# Patient Record
Sex: Female | Born: 1975 | Race: Black or African American | Hispanic: No | Marital: Single | Smoking: Never smoker
Health system: Southern US, Community
[De-identification: ages and names within clinical notes are randomized; demographics above are authoritative.]

## PROBLEM LIST (undated history)

## (undated) DIAGNOSIS — R0600 Dyspnea, unspecified: Secondary | ICD-10-CM

## (undated) DIAGNOSIS — N189 Chronic kidney disease, unspecified: Secondary | ICD-10-CM

## (undated) DIAGNOSIS — I881 Chronic lymphadenitis, except mesenteric: Secondary | ICD-10-CM

## (undated) DIAGNOSIS — R0902 Hypoxemia: Secondary | ICD-10-CM

## (undated) DIAGNOSIS — C50919 Malignant neoplasm of unspecified site of unspecified female breast: Principal | ICD-10-CM

## (undated) DIAGNOSIS — K219 Gastro-esophageal reflux disease without esophagitis: Secondary | ICD-10-CM

## (undated) DIAGNOSIS — T7840XA Allergy, unspecified, initial encounter: Secondary | ICD-10-CM

## (undated) DIAGNOSIS — R7302 Impaired glucose tolerance (oral): Secondary | ICD-10-CM

## (undated) DIAGNOSIS — Z9221 Personal history of antineoplastic chemotherapy: Secondary | ICD-10-CM

## (undated) DIAGNOSIS — I509 Heart failure, unspecified: Secondary | ICD-10-CM

## (undated) DIAGNOSIS — G620 Drug-induced polyneuropathy: Secondary | ICD-10-CM

## (undated) DIAGNOSIS — Z923 Personal history of irradiation: Secondary | ICD-10-CM

## (undated) DIAGNOSIS — Z5189 Encounter for other specified aftercare: Secondary | ICD-10-CM

## (undated) DIAGNOSIS — R0609 Other forms of dyspnea: Secondary | ICD-10-CM

## (undated) DIAGNOSIS — K760 Fatty (change of) liver, not elsewhere classified: Secondary | ICD-10-CM

## (undated) DIAGNOSIS — F419 Anxiety disorder, unspecified: Secondary | ICD-10-CM

## (undated) DIAGNOSIS — I10 Essential (primary) hypertension: Secondary | ICD-10-CM

## (undated) DIAGNOSIS — K3184 Gastroparesis: Secondary | ICD-10-CM

## (undated) DIAGNOSIS — G4730 Sleep apnea, unspecified: Secondary | ICD-10-CM

## (undated) DIAGNOSIS — M1990 Unspecified osteoarthritis, unspecified site: Secondary | ICD-10-CM

## (undated) DIAGNOSIS — G4733 Obstructive sleep apnea (adult) (pediatric): Secondary | ICD-10-CM

## (undated) DIAGNOSIS — IMO0002 Reserved for concepts with insufficient information to code with codable children: Secondary | ICD-10-CM

## (undated) HISTORY — DX: Unspecified osteoarthritis, unspecified site: M19.90

## (undated) HISTORY — DX: Heart failure, unspecified: I50.9

## (undated) HISTORY — DX: Allergy, unspecified, initial encounter: T78.40XA

## (undated) HISTORY — DX: Gastro-esophageal reflux disease without esophagitis: K21.9

## (undated) HISTORY — DX: Obstructive sleep apnea (adult) (pediatric): G47.33

## (undated) HISTORY — DX: Fatty (change of) liver, not elsewhere classified: K76.0

## (undated) HISTORY — DX: Sleep apnea, unspecified: G47.30

## (undated) HISTORY — DX: Drug-induced polyneuropathy: G62.0

## (undated) HISTORY — DX: Dyspnea, unspecified: R06.00

## (undated) HISTORY — DX: Encounter for other specified aftercare: Z51.89

## (undated) HISTORY — DX: Other forms of dyspnea: R06.09

## (undated) HISTORY — DX: Impaired glucose tolerance (oral): R73.02

## (undated) HISTORY — DX: Reserved for concepts with insufficient information to code with codable children: IMO0002

## (undated) HISTORY — DX: Malignant neoplasm of unspecified site of unspecified female breast: C50.919

## (undated) HISTORY — DX: Essential (primary) hypertension: I10

## (undated) HISTORY — DX: Anxiety disorder, unspecified: F41.9

## (undated) HISTORY — DX: Chronic kidney disease, unspecified: N18.9

## (undated) HISTORY — DX: Hypoxemia: R09.02

## (undated) HISTORY — DX: Gastroparesis: K31.84

## (undated) HISTORY — PX: BRAIN SURGERY: SHX531

---

## 2006-10-03 ENCOUNTER — Inpatient Hospital Stay (HOSPITAL_COMMUNITY): Admission: AD | Admit: 2006-10-03 | Discharge: 2006-10-03 | Payer: Self-pay | Admitting: Obstetrics & Gynecology

## 2006-10-06 ENCOUNTER — Inpatient Hospital Stay (HOSPITAL_COMMUNITY): Admission: AD | Admit: 2006-10-06 | Discharge: 2006-10-06 | Payer: Self-pay | Admitting: Obstetrics & Gynecology

## 2006-10-22 ENCOUNTER — Inpatient Hospital Stay (HOSPITAL_COMMUNITY): Admission: AD | Admit: 2006-10-22 | Discharge: 2006-10-22 | Payer: Self-pay | Admitting: Gynecology

## 2006-11-10 ENCOUNTER — Inpatient Hospital Stay (HOSPITAL_COMMUNITY): Admission: AD | Admit: 2006-11-10 | Discharge: 2006-11-10 | Payer: Self-pay | Admitting: Family Medicine

## 2006-12-16 ENCOUNTER — Emergency Department (HOSPITAL_COMMUNITY): Admission: EM | Admit: 2006-12-16 | Discharge: 2006-12-17 | Payer: Self-pay | Admitting: Emergency Medicine

## 2007-01-14 ENCOUNTER — Inpatient Hospital Stay (HOSPITAL_COMMUNITY): Admission: AD | Admit: 2007-01-14 | Discharge: 2007-01-14 | Payer: Self-pay | Admitting: Obstetrics

## 2007-03-04 ENCOUNTER — Inpatient Hospital Stay (HOSPITAL_COMMUNITY): Admission: AD | Admit: 2007-03-04 | Discharge: 2007-03-04 | Payer: Self-pay | Admitting: Obstetrics and Gynecology

## 2007-03-04 ENCOUNTER — Ambulatory Visit: Payer: Self-pay | Admitting: Obstetrics and Gynecology

## 2007-04-29 ENCOUNTER — Ambulatory Visit: Payer: Self-pay | Admitting: Obstetrics & Gynecology

## 2007-04-29 ENCOUNTER — Encounter: Payer: Self-pay | Admitting: Obstetrics & Gynecology

## 2007-04-30 ENCOUNTER — Ambulatory Visit (HOSPITAL_COMMUNITY): Admission: RE | Admit: 2007-04-30 | Discharge: 2007-04-30 | Payer: Self-pay | Admitting: Family Medicine

## 2007-05-13 ENCOUNTER — Ambulatory Visit: Payer: Self-pay | Admitting: Obstetrics & Gynecology

## 2007-05-20 ENCOUNTER — Ambulatory Visit: Payer: Self-pay | Admitting: Obstetrics & Gynecology

## 2007-05-20 ENCOUNTER — Inpatient Hospital Stay (HOSPITAL_COMMUNITY): Admission: AD | Admit: 2007-05-20 | Discharge: 2007-05-20 | Payer: Self-pay | Admitting: Obstetrics & Gynecology

## 2007-05-22 ENCOUNTER — Ambulatory Visit: Payer: Self-pay | Admitting: Obstetrics and Gynecology

## 2007-05-25 ENCOUNTER — Ambulatory Visit: Payer: Self-pay | Admitting: Obstetrics & Gynecology

## 2007-05-25 ENCOUNTER — Ambulatory Visit (HOSPITAL_COMMUNITY): Admission: RE | Admit: 2007-05-25 | Discharge: 2007-05-25 | Payer: Self-pay | Admitting: Obstetrics & Gynecology

## 2007-05-29 ENCOUNTER — Ambulatory Visit: Payer: Self-pay | Admitting: Obstetrics & Gynecology

## 2007-06-01 ENCOUNTER — Inpatient Hospital Stay (HOSPITAL_COMMUNITY): Admission: RE | Admit: 2007-06-01 | Discharge: 2007-06-03 | Payer: Self-pay | Admitting: Family Medicine

## 2007-06-01 ENCOUNTER — Ambulatory Visit: Payer: Self-pay | Admitting: Obstetrics and Gynecology

## 2007-08-12 ENCOUNTER — Emergency Department (HOSPITAL_COMMUNITY): Admission: EM | Admit: 2007-08-12 | Discharge: 2007-08-12 | Payer: Self-pay | Admitting: Emergency Medicine

## 2007-09-11 ENCOUNTER — Emergency Department (HOSPITAL_COMMUNITY): Admission: EM | Admit: 2007-09-11 | Discharge: 2007-09-11 | Payer: Self-pay | Admitting: Emergency Medicine

## 2007-09-27 ENCOUNTER — Inpatient Hospital Stay (HOSPITAL_COMMUNITY): Admission: AD | Admit: 2007-09-27 | Discharge: 2007-09-27 | Payer: Self-pay | Admitting: Family Medicine

## 2007-10-11 ENCOUNTER — Emergency Department (HOSPITAL_COMMUNITY): Admission: EM | Admit: 2007-10-11 | Discharge: 2007-10-11 | Payer: Self-pay | Admitting: Emergency Medicine

## 2007-12-04 ENCOUNTER — Emergency Department (HOSPITAL_COMMUNITY): Admission: EM | Admit: 2007-12-04 | Discharge: 2007-12-04 | Payer: Self-pay | Admitting: *Deleted

## 2007-12-10 ENCOUNTER — Emergency Department (HOSPITAL_COMMUNITY): Admission: EM | Admit: 2007-12-10 | Discharge: 2007-12-10 | Payer: Self-pay | Admitting: Emergency Medicine

## 2008-01-13 ENCOUNTER — Observation Stay (HOSPITAL_COMMUNITY): Admission: EM | Admit: 2008-01-13 | Discharge: 2008-01-14 | Payer: Self-pay | Admitting: Emergency Medicine

## 2008-04-01 ENCOUNTER — Inpatient Hospital Stay (HOSPITAL_COMMUNITY): Admission: AD | Admit: 2008-04-01 | Discharge: 2008-04-01 | Payer: Self-pay | Admitting: Obstetrics & Gynecology

## 2008-04-01 DIAGNOSIS — C50919 Malignant neoplasm of unspecified site of unspecified female breast: Secondary | ICD-10-CM

## 2008-04-01 DIAGNOSIS — Z9221 Personal history of antineoplastic chemotherapy: Secondary | ICD-10-CM

## 2008-04-01 DIAGNOSIS — Z923 Personal history of irradiation: Secondary | ICD-10-CM

## 2008-04-01 HISTORY — DX: Personal history of irradiation: Z92.3

## 2008-04-01 HISTORY — DX: Personal history of antineoplastic chemotherapy: Z92.21

## 2008-04-01 HISTORY — PX: LYMPH NODE DISSECTION: SHX5087

## 2008-04-01 HISTORY — DX: Malignant neoplasm of unspecified site of unspecified female breast: C50.919

## 2008-04-02 ENCOUNTER — Inpatient Hospital Stay (HOSPITAL_COMMUNITY): Admission: AD | Admit: 2008-04-02 | Discharge: 2008-04-03 | Payer: Self-pay | Admitting: Obstetrics & Gynecology

## 2008-04-02 ENCOUNTER — Ambulatory Visit: Payer: Self-pay | Admitting: Obstetrics & Gynecology

## 2008-04-02 ENCOUNTER — Encounter: Payer: Self-pay | Admitting: Obstetrics & Gynecology

## 2008-05-19 ENCOUNTER — Emergency Department (HOSPITAL_COMMUNITY): Admission: EM | Admit: 2008-05-19 | Discharge: 2008-05-19 | Payer: Self-pay | Admitting: Family Medicine

## 2008-05-20 ENCOUNTER — Encounter: Admission: RE | Admit: 2008-05-20 | Discharge: 2008-05-20 | Payer: Self-pay | Admitting: Family Medicine

## 2008-06-16 ENCOUNTER — Ambulatory Visit: Payer: Self-pay | Admitting: Oncology

## 2008-06-22 ENCOUNTER — Ambulatory Visit: Payer: Self-pay | Admitting: Genetic Counselor

## 2008-06-22 LAB — CBC WITH DIFFERENTIAL/PLATELET
Basophils Absolute: 0 10*3/uL (ref 0.0–0.1)
EOS%: 3.4 % (ref 0.0–7.0)
Eosinophils Absolute: 0.2 10*3/uL (ref 0.0–0.5)
HGB: 12.9 g/dL (ref 11.6–15.9)
LYMPH%: 40.4 % (ref 14.0–49.7)
MCH: 25.8 pg (ref 25.1–34.0)
MCV: 77.2 fL — ABNORMAL LOW (ref 79.5–101.0)
MONO%: 7.2 % (ref 0.0–14.0)
NEUT#: 2.3 10*3/uL (ref 1.5–6.5)
NEUT%: 48.6 % (ref 38.4–76.8)
Platelets: 217 10*3/uL (ref 145–400)
RDW: 17.3 % — ABNORMAL HIGH (ref 11.2–14.5)

## 2008-06-22 LAB — COMPREHENSIVE METABOLIC PANEL
Alkaline Phosphatase: 47 U/L (ref 39–117)
BUN: 7 mg/dL (ref 6–23)
CO2: 19 mEq/L (ref 19–32)
Creatinine, Ser: 0.94 mg/dL (ref 0.40–1.20)
Glucose, Bld: 89 mg/dL (ref 70–99)
Sodium: 139 mEq/L (ref 135–145)
Total Bilirubin: 0.3 mg/dL (ref 0.3–1.2)
Total Protein: 7.3 g/dL (ref 6.0–8.3)

## 2008-06-22 LAB — LACTATE DEHYDROGENASE: LDH: 270 U/L — ABNORMAL HIGH (ref 94–250)

## 2008-06-27 ENCOUNTER — Ambulatory Visit (HOSPITAL_COMMUNITY): Admission: RE | Admit: 2008-06-27 | Discharge: 2008-06-27 | Payer: Self-pay | Admitting: Oncology

## 2008-06-28 ENCOUNTER — Ambulatory Visit: Admission: RE | Admit: 2008-06-28 | Discharge: 2008-06-28 | Payer: Self-pay | Admitting: Oncology

## 2008-06-28 ENCOUNTER — Encounter: Payer: Self-pay | Admitting: Oncology

## 2008-06-29 ENCOUNTER — Ambulatory Visit (HOSPITAL_COMMUNITY): Admission: RE | Admit: 2008-06-29 | Discharge: 2008-06-29 | Payer: Self-pay | Admitting: Surgery

## 2008-07-11 ENCOUNTER — Emergency Department (HOSPITAL_COMMUNITY): Admission: EM | Admit: 2008-07-11 | Discharge: 2008-07-11 | Payer: Self-pay | Admitting: Emergency Medicine

## 2008-07-21 LAB — CBC WITH DIFFERENTIAL/PLATELET
Basophils Absolute: 0 10*3/uL (ref 0.0–0.1)
EOS%: 0.4 % (ref 0.0–7.0)
Eosinophils Absolute: 0 10*3/uL (ref 0.0–0.5)
HCT: 34.2 % — ABNORMAL LOW (ref 34.8–46.6)
HGB: 11.6 g/dL (ref 11.6–15.9)
MCH: 27.9 pg (ref 25.1–34.0)
MCV: 82.3 fL (ref 79.5–101.0)
NEUT#: 6.6 10*3/uL — ABNORMAL HIGH (ref 1.5–6.5)
NEUT%: 72.1 % (ref 38.4–76.8)
RDW: 21.3 % — ABNORMAL HIGH (ref 11.2–14.5)
lymph#: 1.7 10*3/uL (ref 0.9–3.3)

## 2008-08-03 ENCOUNTER — Ambulatory Visit: Payer: Self-pay | Admitting: Genetic Counselor

## 2008-08-05 ENCOUNTER — Ambulatory Visit: Payer: Self-pay | Admitting: Oncology

## 2008-08-05 LAB — CBC WITH DIFFERENTIAL/PLATELET
Basophils Absolute: 0.1 10*3/uL (ref 0.0–0.1)
Eosinophils Absolute: 0.1 10*3/uL (ref 0.0–0.5)
HGB: 13.2 g/dL (ref 11.6–15.9)
MCV: 81.4 fL (ref 79.5–101.0)
MONO%: 13.2 % (ref 0.0–14.0)
NEUT#: 2.9 10*3/uL (ref 1.5–6.5)
Platelets: 275 10*3/uL (ref 145–400)
RDW: 20.7 % — ABNORMAL HIGH (ref 11.2–14.5)

## 2008-08-05 LAB — COMPREHENSIVE METABOLIC PANEL
Albumin: 4.3 g/dL (ref 3.5–5.2)
Alkaline Phosphatase: 49 U/L (ref 39–117)
BUN: 7 mg/dL (ref 6–23)
Glucose, Bld: 69 mg/dL — ABNORMAL LOW (ref 70–99)
Total Bilirubin: 0.7 mg/dL (ref 0.3–1.2)

## 2008-08-11 ENCOUNTER — Emergency Department (HOSPITAL_COMMUNITY): Admission: EM | Admit: 2008-08-11 | Discharge: 2008-08-11 | Payer: Self-pay | Admitting: Emergency Medicine

## 2008-08-19 LAB — CBC WITH DIFFERENTIAL/PLATELET
BASO%: 0.5 % (ref 0.0–2.0)
Basophils Absolute: 0 10*3/uL (ref 0.0–0.1)
Eosinophils Absolute: 0 10*3/uL (ref 0.0–0.5)
HCT: 38.3 % (ref 34.8–46.6)
HGB: 13 g/dL (ref 11.6–15.9)
LYMPH%: 22.4 % (ref 14.0–49.7)
MCHC: 33.9 g/dL (ref 31.5–36.0)
MONO#: 0.4 10*3/uL (ref 0.1–0.9)
NEUT%: 70 % (ref 38.4–76.8)
Platelets: 176 10*3/uL (ref 145–400)
WBC: 5.7 10*3/uL (ref 3.9–10.3)

## 2008-08-26 LAB — COMPREHENSIVE METABOLIC PANEL
AST: 26 U/L (ref 0–37)
Alkaline Phosphatase: 46 U/L (ref 39–117)
BUN: 8 mg/dL (ref 6–23)
Calcium: 9.2 mg/dL (ref 8.4–10.5)
Creatinine, Ser: 0.77 mg/dL (ref 0.40–1.20)
Glucose, Bld: 80 mg/dL (ref 70–99)

## 2008-08-26 LAB — CBC WITH DIFFERENTIAL/PLATELET
BASO%: 1 % (ref 0.0–2.0)
Basophils Absolute: 0 10*3/uL (ref 0.0–0.1)
EOS%: 1.3 % (ref 0.0–7.0)
HCT: 38.5 % (ref 34.8–46.6)
LYMPH%: 22.9 % (ref 14.0–49.7)
MCH: 30.1 pg (ref 25.1–34.0)
MCHC: 34.3 g/dL (ref 31.5–36.0)
MCV: 87.7 fL (ref 79.5–101.0)
MONO%: 12.5 % (ref 0.0–14.0)
NEUT%: 62.3 % (ref 38.4–76.8)
Platelets: 257 10*3/uL (ref 145–400)

## 2008-09-02 LAB — CBC WITH DIFFERENTIAL/PLATELET
Basophils Absolute: 0 10*3/uL (ref 0.0–0.1)
EOS%: 0.9 % (ref 0.0–7.0)
HCT: 36.3 % (ref 34.8–46.6)
HGB: 12.4 g/dL (ref 11.6–15.9)
MCH: 31.5 pg (ref 25.1–34.0)
MCV: 92.1 fL (ref 79.5–101.0)
MONO%: 8.5 % (ref 0.0–14.0)
NEUT%: 68.5 % (ref 38.4–76.8)

## 2008-09-12 ENCOUNTER — Encounter: Admission: RE | Admit: 2008-09-12 | Discharge: 2008-09-12 | Payer: Self-pay | Admitting: Oncology

## 2008-09-14 ENCOUNTER — Ambulatory Visit: Payer: Self-pay | Admitting: Oncology

## 2008-09-16 LAB — CBC WITH DIFFERENTIAL/PLATELET
Basophils Absolute: 0.1 10*3/uL (ref 0.0–0.1)
EOS%: 2.1 % (ref 0.0–7.0)
HGB: 12.7 g/dL (ref 11.6–15.9)
LYMPH%: 31.2 % (ref 14.0–49.7)
MCH: 30.9 pg (ref 25.1–34.0)
MCV: 91.2 fL (ref 79.5–101.0)
MONO%: 14.7 % — ABNORMAL HIGH (ref 0.0–14.0)
Platelets: 253 10*3/uL (ref 145–400)
RBC: 4.11 10*6/uL (ref 3.70–5.45)
RDW: 16 % — ABNORMAL HIGH (ref 11.2–14.5)

## 2008-09-16 LAB — COMPREHENSIVE METABOLIC PANEL
Alkaline Phosphatase: 48 U/L (ref 39–117)
BUN: 8 mg/dL (ref 6–23)
Glucose, Bld: 89 mg/dL (ref 70–99)
Sodium: 140 mEq/L (ref 135–145)
Total Bilirubin: 0.4 mg/dL (ref 0.3–1.2)
Total Protein: 7.3 g/dL (ref 6.0–8.3)

## 2008-09-22 LAB — CBC WITH DIFFERENTIAL/PLATELET
Basophils Absolute: 0 10*3/uL (ref 0.0–0.1)
Eosinophils Absolute: 0.1 10*3/uL (ref 0.0–0.5)
HGB: 11.9 g/dL (ref 11.6–15.9)
LYMPH%: 18.5 % (ref 14.0–49.7)
MCV: 93.5 fL (ref 79.5–101.0)
MONO%: 6.7 % (ref 0.0–14.0)
NEUT#: 3.7 10*3/uL (ref 1.5–6.5)
Platelets: 178 10*3/uL (ref 145–400)

## 2008-09-29 HISTORY — PX: BREAST LUMPECTOMY: SHX2

## 2008-10-07 LAB — COMPREHENSIVE METABOLIC PANEL
ALT: 32 U/L (ref 0–35)
AST: 25 U/L (ref 0–37)
Albumin: 3.8 g/dL (ref 3.5–5.2)
Alkaline Phosphatase: 38 U/L — ABNORMAL LOW (ref 39–117)
BUN: 6 mg/dL (ref 6–23)
CO2: 25 mEq/L (ref 19–32)
Calcium: 9.4 mg/dL (ref 8.4–10.5)
Chloride: 107 mEq/L (ref 96–112)
Creatinine, Ser: 0.79 mg/dL (ref 0.40–1.20)
Glucose, Bld: 99 mg/dL (ref 70–99)
Potassium: 3.6 mEq/L (ref 3.5–5.3)
Sodium: 137 mEq/L (ref 135–145)
Total Bilirubin: 1 mg/dL (ref 0.3–1.2)
Total Protein: 7.1 g/dL (ref 6.0–8.3)

## 2008-10-07 LAB — CBC WITH DIFFERENTIAL/PLATELET
Basophils Absolute: 0 10*3/uL (ref 0.0–0.1)
EOS%: 1.7 % (ref 0.0–7.0)
HGB: 13.1 g/dL (ref 11.6–15.9)
MCH: 32.7 pg (ref 25.1–34.0)
MCV: 94 fL (ref 79.5–101.0)
MONO%: 9.6 % (ref 0.0–14.0)
NEUT%: 65.2 % (ref 38.4–76.8)
RDW: 15.3 % — ABNORMAL HIGH (ref 11.2–14.5)

## 2008-10-15 ENCOUNTER — Emergency Department (HOSPITAL_COMMUNITY): Admission: EM | Admit: 2008-10-15 | Discharge: 2008-10-16 | Payer: Self-pay | Admitting: Emergency Medicine

## 2008-10-25 ENCOUNTER — Encounter: Admission: RE | Admit: 2008-10-25 | Discharge: 2008-10-25 | Payer: Self-pay | Admitting: Surgery

## 2008-10-27 ENCOUNTER — Ambulatory Visit (HOSPITAL_BASED_OUTPATIENT_CLINIC_OR_DEPARTMENT_OTHER): Admission: RE | Admit: 2008-10-27 | Discharge: 2008-10-27 | Payer: Self-pay | Admitting: Surgery

## 2008-10-27 ENCOUNTER — Encounter (INDEPENDENT_AMBULATORY_CARE_PROVIDER_SITE_OTHER): Payer: Self-pay | Admitting: Surgery

## 2008-10-27 ENCOUNTER — Encounter: Admission: RE | Admit: 2008-10-27 | Discharge: 2008-10-27 | Payer: Self-pay | Admitting: Surgery

## 2008-11-10 ENCOUNTER — Encounter (INDEPENDENT_AMBULATORY_CARE_PROVIDER_SITE_OTHER): Payer: Self-pay | Admitting: Surgery

## 2008-11-10 ENCOUNTER — Ambulatory Visit (HOSPITAL_COMMUNITY): Admission: RE | Admit: 2008-11-10 | Discharge: 2008-11-10 | Payer: Self-pay | Admitting: Surgery

## 2008-11-11 ENCOUNTER — Ambulatory Visit: Payer: Self-pay | Admitting: Oncology

## 2008-11-15 LAB — CBC WITH DIFFERENTIAL/PLATELET
Eosinophils Absolute: 0.2 10*3/uL (ref 0.0–0.5)
MONO#: 0.2 10*3/uL (ref 0.1–0.9)
NEUT#: 2.4 10*3/uL (ref 1.5–6.5)
Platelets: 234 10*3/uL (ref 145–400)
RBC: 4.12 10*6/uL (ref 3.70–5.45)
RDW: 13.7 % (ref 11.2–14.5)
WBC: 4.3 10*3/uL (ref 3.9–10.3)

## 2008-11-15 LAB — COMPREHENSIVE METABOLIC PANEL
Albumin: 3.9 g/dL (ref 3.5–5.2)
CO2: 21 mEq/L (ref 19–32)
Chloride: 109 mEq/L (ref 96–112)
Glucose, Bld: 115 mg/dL — ABNORMAL HIGH (ref 70–99)
Potassium: 3.6 mEq/L (ref 3.5–5.3)
Sodium: 141 mEq/L (ref 135–145)
Total Protein: 7.1 g/dL (ref 6.0–8.3)

## 2008-11-16 ENCOUNTER — Emergency Department (HOSPITAL_COMMUNITY): Admission: EM | Admit: 2008-11-16 | Discharge: 2008-11-16 | Payer: Self-pay | Admitting: Emergency Medicine

## 2008-11-29 LAB — CBC WITH DIFFERENTIAL/PLATELET
Eosinophils Absolute: 0.1 10*3/uL (ref 0.0–0.5)
MCV: 92.3 fL (ref 79.5–101.0)
MONO#: 0.4 10*3/uL (ref 0.1–0.9)
MONO%: 9 % (ref 0.0–14.0)
NEUT#: 2.7 10*3/uL (ref 1.5–6.5)
RBC: 4.4 10*6/uL (ref 3.70–5.45)
RDW: 14.5 % (ref 11.2–14.5)
WBC: 4.8 10*3/uL (ref 3.9–10.3)

## 2008-11-29 LAB — COMPREHENSIVE METABOLIC PANEL
AST: 26 U/L (ref 0–37)
Albumin: 4.2 g/dL (ref 3.5–5.2)
Alkaline Phosphatase: 55 U/L (ref 39–117)
BUN: 9 mg/dL (ref 6–23)
Potassium: 3.5 mEq/L (ref 3.5–5.3)
Sodium: 138 mEq/L (ref 135–145)

## 2008-12-06 ENCOUNTER — Ambulatory Visit: Payer: Self-pay | Admitting: Oncology

## 2008-12-08 LAB — CBC WITH DIFFERENTIAL/PLATELET
BASO%: 1 % (ref 0.0–2.0)
HCT: 40.4 % (ref 34.8–46.6)
LYMPH%: 40.2 % (ref 14.0–49.7)
MCHC: 35.4 g/dL (ref 31.5–36.0)
MCV: 87.8 fL (ref 79.5–101.0)
MONO#: 0.4 10*3/uL (ref 0.1–0.9)
MONO%: 8.3 % (ref 0.0–14.0)
NEUT#: 2.3 10*3/uL (ref 1.5–6.5)
NEUT%: 47.2 % (ref 38.4–76.8)
Platelets: 223 10*3/uL (ref 145–400)

## 2008-12-14 ENCOUNTER — Emergency Department (HOSPITAL_COMMUNITY): Admission: EM | Admit: 2008-12-14 | Discharge: 2008-12-14 | Payer: Self-pay | Admitting: Emergency Medicine

## 2008-12-14 LAB — CBC WITH DIFFERENTIAL/PLATELET
BASO%: 0.6 % (ref 0.0–2.0)
Basophils Absolute: 0 10*3/uL (ref 0.0–0.1)
EOS%: 3 % (ref 0.0–7.0)
HCT: 41.1 % (ref 34.8–46.6)
HGB: 14.2 g/dL (ref 11.6–15.9)
LYMPH%: 43.7 % (ref 14.0–49.7)
MCH: 31.6 pg (ref 25.1–34.0)
MCHC: 34.5 g/dL (ref 31.5–36.0)
MCV: 91.4 fL (ref 79.5–101.0)
MONO%: 5 % (ref 0.0–14.0)
NEUT%: 47.7 % (ref 38.4–76.8)
Platelets: 223 10*3/uL (ref 145–400)

## 2008-12-21 ENCOUNTER — Emergency Department (HOSPITAL_COMMUNITY): Admission: EM | Admit: 2008-12-21 | Discharge: 2008-12-21 | Payer: Self-pay | Admitting: Emergency Medicine

## 2008-12-21 LAB — CBC WITH DIFFERENTIAL/PLATELET
BASO%: 0.6 % (ref 0.0–2.0)
Basophils Absolute: 0 10*3/uL (ref 0.0–0.1)
EOS%: 2.6 % (ref 0.0–7.0)
HGB: 13.4 g/dL (ref 11.6–15.9)
MCH: 31.9 pg (ref 25.1–34.0)
MCHC: 34.8 g/dL (ref 31.5–36.0)
MCV: 91.6 fL (ref 79.5–101.0)
MONO%: 4.6 % (ref 0.0–14.0)
RBC: 4.21 10*6/uL (ref 3.70–5.45)
RDW: 14.4 % (ref 11.2–14.5)

## 2008-12-22 ENCOUNTER — Emergency Department (HOSPITAL_COMMUNITY): Admission: EM | Admit: 2008-12-22 | Discharge: 2008-12-22 | Payer: Self-pay | Admitting: Emergency Medicine

## 2008-12-29 LAB — CBC WITH DIFFERENTIAL/PLATELET
EOS%: 1 % (ref 0.0–7.0)
Eosinophils Absolute: 0.1 10*3/uL (ref 0.0–0.5)
MCV: 88.1 fL (ref 79.5–101.0)
MONO%: 9.8 % (ref 0.0–14.0)
NEUT#: 3.1 10*3/uL (ref 1.5–6.5)
RBC: 4.77 10*6/uL (ref 3.70–5.45)
RDW: 13.7 % (ref 11.2–14.5)
lymph#: 1.4 10*3/uL (ref 0.9–3.3)

## 2009-01-05 ENCOUNTER — Ambulatory Visit: Payer: Self-pay | Admitting: Oncology

## 2009-01-05 LAB — CBC WITH DIFFERENTIAL/PLATELET
EOS%: 2.5 % (ref 0.0–7.0)
MCH: 30.9 pg (ref 25.1–34.0)
MCV: 88.6 fL (ref 79.5–101.0)
MONO%: 6.9 % (ref 0.0–14.0)
NEUT#: 2.1 10*3/uL (ref 1.5–6.5)
RBC: 4.21 10*6/uL (ref 3.70–5.45)
RDW: 13.8 % (ref 11.2–14.5)
nRBC: 0 % (ref 0–0)

## 2009-01-05 LAB — COMPREHENSIVE METABOLIC PANEL
AST: 19 U/L (ref 0–37)
Albumin: 2.8 g/dL — ABNORMAL LOW (ref 3.5–5.2)
BUN: 6 mg/dL (ref 6–23)
CO2: 19 mEq/L (ref 19–32)
Creatinine, Ser: 0.49 mg/dL (ref 0.40–1.20)
Total Bilirubin: 0.4 mg/dL (ref 0.3–1.2)
Total Protein: 5.2 g/dL — ABNORMAL LOW (ref 6.0–8.3)

## 2009-01-12 LAB — CBC WITH DIFFERENTIAL/PLATELET
BASO%: 0.9 % (ref 0.0–2.0)
HCT: 37.9 % (ref 34.8–46.6)
LYMPH%: 39.4 % (ref 14.0–49.7)
MCHC: 34.5 g/dL (ref 31.5–36.0)
MONO#: 0.3 10*3/uL (ref 0.1–0.9)
NEUT%: 50.6 % (ref 38.4–76.8)
Platelets: 249 10*3/uL (ref 145–400)
WBC: 4.4 10*3/uL (ref 3.9–10.3)

## 2009-01-12 LAB — COMPREHENSIVE METABOLIC PANEL
ALT: 32 U/L (ref 0–35)
AST: 26 U/L (ref 0–37)
Alkaline Phosphatase: 53 U/L (ref 39–117)
CO2: 20 mEq/L (ref 19–32)
Creatinine, Ser: 0.78 mg/dL (ref 0.40–1.20)
Total Bilirubin: 0.4 mg/dL (ref 0.3–1.2)

## 2009-01-18 LAB — CBC WITH DIFFERENTIAL/PLATELET
BASO%: 0.8 % (ref 0.0–2.0)
EOS%: 2.3 % (ref 0.0–7.0)
HCT: 36.1 % (ref 34.8–46.6)
LYMPH%: 47.2 % (ref 14.0–49.7)
MCH: 31.9 pg (ref 25.1–34.0)
MCHC: 35 g/dL (ref 31.5–36.0)
MONO%: 6.5 % (ref 0.0–14.0)
NEUT%: 43.2 % (ref 38.4–76.8)
Platelets: 280 10*3/uL (ref 145–400)
lymph#: 1.5 10*3/uL (ref 0.9–3.3)

## 2009-01-26 LAB — CBC WITH DIFFERENTIAL/PLATELET
BASO%: 1.7 % (ref 0.0–2.0)
EOS%: 3.1 % (ref 0.0–7.0)
MCH: 31.3 pg (ref 25.1–34.0)
MCHC: 34.7 g/dL (ref 31.5–36.0)
RBC: 3.96 10*6/uL (ref 3.70–5.45)
RDW: 15.8 % — ABNORMAL HIGH (ref 11.2–14.5)
lymph#: 2 10*3/uL (ref 0.9–3.3)

## 2009-02-02 LAB — COMPREHENSIVE METABOLIC PANEL
AST: 20 U/L (ref 0–37)
Albumin: 4.3 g/dL (ref 3.5–5.2)
BUN: 9 mg/dL (ref 6–23)
Calcium: 9 mg/dL (ref 8.4–10.5)
Chloride: 108 mEq/L (ref 96–112)
Glucose, Bld: 102 mg/dL — ABNORMAL HIGH (ref 70–99)
Potassium: 3.8 mEq/L (ref 3.5–5.3)

## 2009-02-02 LAB — CBC WITH DIFFERENTIAL/PLATELET
Basophils Absolute: 0 10*3/uL (ref 0.0–0.1)
EOS%: 3.1 % (ref 0.0–7.0)
Eosinophils Absolute: 0.1 10*3/uL (ref 0.0–0.5)
HGB: 12.7 g/dL (ref 11.6–15.9)
MONO#: 0.4 10*3/uL (ref 0.1–0.9)
NEUT#: 2.3 10*3/uL (ref 1.5–6.5)
RDW: 17.5 % — ABNORMAL HIGH (ref 11.2–14.5)
WBC: 4.4 10*3/uL (ref 3.9–10.3)
lymph#: 1.4 10*3/uL (ref 0.9–3.3)

## 2009-02-07 ENCOUNTER — Ambulatory Visit: Payer: Self-pay | Admitting: Oncology

## 2009-02-07 ENCOUNTER — Emergency Department (HOSPITAL_COMMUNITY): Admission: EM | Admit: 2009-02-07 | Discharge: 2009-02-08 | Payer: Self-pay | Admitting: Emergency Medicine

## 2009-02-09 LAB — CBC WITH DIFFERENTIAL/PLATELET
BASO%: 0.9 % (ref 0.0–2.0)
Basophils Absolute: 0 10*3/uL (ref 0.0–0.1)
EOS%: 3.1 % (ref 0.0–7.0)
HGB: 12.8 g/dL (ref 11.6–15.9)
MCH: 32.7 pg (ref 25.1–34.0)
RDW: 18 % — ABNORMAL HIGH (ref 11.2–14.5)
lymph#: 1.5 10*3/uL (ref 0.9–3.3)

## 2009-02-09 LAB — COMPREHENSIVE METABOLIC PANEL
ALT: 31 U/L (ref 0–35)
AST: 29 U/L (ref 0–37)
Albumin: 4.6 g/dL (ref 3.5–5.2)
Calcium: 9.4 mg/dL (ref 8.4–10.5)
Chloride: 104 mEq/L (ref 96–112)
Potassium: 3.7 mEq/L (ref 3.5–5.3)
Sodium: 139 mEq/L (ref 135–145)
Total Protein: 7.5 g/dL (ref 6.0–8.3)

## 2009-02-16 LAB — COMPREHENSIVE METABOLIC PANEL
ALT: 30 U/L (ref 0–35)
AST: 27 U/L (ref 0–37)
Albumin: 4.3 g/dL (ref 3.5–5.2)
Alkaline Phosphatase: 46 U/L (ref 39–117)
Calcium: 9.4 mg/dL (ref 8.4–10.5)
Chloride: 105 mEq/L (ref 96–112)
Creatinine, Ser: 0.8 mg/dL (ref 0.40–1.20)
Potassium: 3.8 mEq/L (ref 3.5–5.3)

## 2009-02-16 LAB — CBC WITH DIFFERENTIAL/PLATELET
BASO%: 0.6 % (ref 0.0–2.0)
EOS%: 2.7 % (ref 0.0–7.0)
MCH: 33.4 pg (ref 25.1–34.0)
MCHC: 34.3 g/dL (ref 31.5–36.0)
MONO%: 6.4 % (ref 0.0–14.0)
RDW: 18.7 % — ABNORMAL HIGH (ref 11.2–14.5)
lymph#: 1.5 10*3/uL (ref 0.9–3.3)

## 2009-03-02 LAB — CBC WITH DIFFERENTIAL/PLATELET
BASO%: 0.9 % (ref 0.0–2.0)
Basophils Absolute: 0 10*3/uL (ref 0.0–0.1)
EOS%: 2.4 % (ref 0.0–7.0)
HCT: 37.7 % (ref 34.8–46.6)
HGB: 12.9 g/dL (ref 11.6–15.9)
MCH: 33.3 pg (ref 25.1–34.0)
MCHC: 34.2 g/dL (ref 31.5–36.0)
MCV: 97.4 fL (ref 79.5–101.0)
MONO%: 11.7 % (ref 0.0–14.0)
NEUT%: 50 % (ref 38.4–76.8)
lymph#: 1.6 10*3/uL (ref 0.9–3.3)

## 2009-03-02 LAB — COMPREHENSIVE METABOLIC PANEL
ALT: 37 U/L — ABNORMAL HIGH (ref 0–35)
AST: 34 U/L (ref 0–37)
Alkaline Phosphatase: 57 U/L (ref 39–117)
BUN: 6 mg/dL (ref 6–23)
Creatinine, Ser: 0.84 mg/dL (ref 0.40–1.20)
Total Bilirubin: 0.7 mg/dL (ref 0.3–1.2)

## 2009-03-06 ENCOUNTER — Ambulatory Visit: Payer: Self-pay | Admitting: Oncology

## 2009-03-09 LAB — CBC WITH DIFFERENTIAL/PLATELET
BASO%: 2.2 % — ABNORMAL HIGH (ref 0.0–2.0)
Eosinophils Absolute: 0.2 10*3/uL (ref 0.0–0.5)
HCT: 39.3 % (ref 34.8–46.6)
HGB: 13.4 g/dL (ref 11.6–15.9)
MCHC: 34.1 g/dL (ref 31.5–36.0)
MONO#: 0.5 10*3/uL (ref 0.1–0.9)
NEUT#: 2.9 10*3/uL (ref 1.5–6.5)
NEUT%: 58.3 % (ref 38.4–76.8)
Platelets: 253 10*3/uL (ref 145–400)
WBC: 5.1 10*3/uL (ref 3.9–10.3)
lymph#: 1.3 10*3/uL (ref 0.9–3.3)

## 2009-03-09 LAB — COMPREHENSIVE METABOLIC PANEL
ALT: 39 U/L — ABNORMAL HIGH (ref 0–35)
BUN: 7 mg/dL (ref 6–23)
CO2: 25 mEq/L (ref 19–32)
Calcium: 9.2 mg/dL (ref 8.4–10.5)
Chloride: 106 mEq/L (ref 96–112)
Creatinine, Ser: 0.9 mg/dL (ref 0.40–1.20)
Glucose, Bld: 110 mg/dL — ABNORMAL HIGH (ref 70–99)
Total Bilirubin: 0.5 mg/dL (ref 0.3–1.2)

## 2009-03-21 ENCOUNTER — Ambulatory Visit: Admission: RE | Admit: 2009-03-21 | Discharge: 2009-03-31 | Payer: Self-pay | Admitting: Radiation Oncology

## 2009-03-27 LAB — CBC WITH DIFFERENTIAL/PLATELET
Basophils Absolute: 0 10*3/uL (ref 0.0–0.1)
HCT: 39.9 % (ref 34.8–46.6)
HGB: 13.7 g/dL (ref 11.6–15.9)
LYMPH%: 19.1 % (ref 14.0–49.7)
MCH: 32.8 pg (ref 25.1–34.0)
MONO#: 0.4 10*3/uL (ref 0.1–0.9)
NEUT%: 72.7 % (ref 38.4–76.8)
Platelets: 196 10*3/uL (ref 145–400)
lymph#: 1 10*3/uL (ref 0.9–3.3)

## 2009-03-27 LAB — COMPREHENSIVE METABOLIC PANEL
BUN: 8 mg/dL (ref 6–23)
CO2: 19 mEq/L (ref 19–32)
Calcium: 9.5 mg/dL (ref 8.4–10.5)
Chloride: 108 mEq/L (ref 96–112)
Creatinine, Ser: 0.81 mg/dL (ref 0.40–1.20)

## 2009-03-28 LAB — TSH: TSH: 0.817 u[IU]/mL (ref 0.350–4.500)

## 2009-03-30 ENCOUNTER — Encounter: Admission: RE | Admit: 2009-03-30 | Discharge: 2009-05-29 | Payer: Self-pay | Admitting: Radiation Oncology

## 2009-04-01 HISTORY — PX: CARPAL TUNNEL RELEASE: SHX101

## 2009-04-02 ENCOUNTER — Ambulatory Visit: Admission: RE | Admit: 2009-04-02 | Discharge: 2009-06-13 | Payer: Self-pay | Admitting: Radiation Oncology

## 2009-04-11 ENCOUNTER — Emergency Department (HOSPITAL_COMMUNITY): Admission: EM | Admit: 2009-04-11 | Discharge: 2009-04-12 | Payer: Self-pay | Admitting: Emergency Medicine

## 2009-05-22 ENCOUNTER — Encounter: Admission: RE | Admit: 2009-05-22 | Discharge: 2009-05-22 | Payer: Self-pay | Admitting: Oncology

## 2009-05-25 ENCOUNTER — Encounter: Admission: RE | Admit: 2009-05-25 | Discharge: 2009-05-25 | Payer: Self-pay | Admitting: Surgery

## 2009-05-31 ENCOUNTER — Ambulatory Visit: Payer: Self-pay | Admitting: Oncology

## 2009-05-31 LAB — COMPREHENSIVE METABOLIC PANEL
Albumin: 4.2 g/dL (ref 3.5–5.2)
Alkaline Phosphatase: 62 U/L (ref 39–117)
Chloride: 108 mEq/L (ref 96–112)
Glucose, Bld: 104 mg/dL — ABNORMAL HIGH (ref 70–99)
Potassium: 3.6 mEq/L (ref 3.5–5.3)
Sodium: 141 mEq/L (ref 135–145)
Total Protein: 7.1 g/dL (ref 6.0–8.3)

## 2009-05-31 LAB — CBC WITH DIFFERENTIAL/PLATELET
Eosinophils Absolute: 0.1 10*3/uL (ref 0.0–0.5)
MCV: 91.5 fL (ref 79.5–101.0)
MONO#: 0.4 10*3/uL (ref 0.1–0.9)
MONO%: 9.7 % (ref 0.0–14.0)
NEUT#: 3 10*3/uL (ref 1.5–6.5)
RBC: 4.61 10*6/uL (ref 3.70–5.45)
RDW: 13.6 % (ref 11.2–14.5)
WBC: 4.2 10*3/uL (ref 3.9–10.3)
lymph#: 0.7 10*3/uL — ABNORMAL LOW (ref 0.9–3.3)

## 2009-06-20 ENCOUNTER — Ambulatory Visit (HOSPITAL_BASED_OUTPATIENT_CLINIC_OR_DEPARTMENT_OTHER): Admission: RE | Admit: 2009-06-20 | Discharge: 2009-06-20 | Payer: Self-pay | Admitting: Surgery

## 2009-07-05 ENCOUNTER — Emergency Department (HOSPITAL_COMMUNITY): Admission: EM | Admit: 2009-07-05 | Discharge: 2009-07-06 | Payer: Self-pay | Admitting: Emergency Medicine

## 2009-07-13 ENCOUNTER — Emergency Department (HOSPITAL_COMMUNITY): Admission: EM | Admit: 2009-07-13 | Discharge: 2009-07-13 | Payer: Self-pay | Admitting: Emergency Medicine

## 2009-07-24 ENCOUNTER — Inpatient Hospital Stay (HOSPITAL_COMMUNITY): Admission: EM | Admit: 2009-07-24 | Discharge: 2009-07-26 | Payer: Self-pay | Admitting: Emergency Medicine

## 2009-08-07 ENCOUNTER — Ambulatory Visit: Payer: Self-pay | Admitting: Oncology

## 2009-08-11 ENCOUNTER — Ambulatory Visit: Payer: Self-pay | Admitting: Family Medicine

## 2009-08-30 LAB — CBC WITH DIFFERENTIAL/PLATELET
Basophils Absolute: 0 10*3/uL (ref 0.0–0.1)
Eosinophils Absolute: 0.1 10*3/uL (ref 0.0–0.5)
HGB: 14.3 g/dL (ref 11.6–15.9)
MONO#: 0.4 10*3/uL (ref 0.1–0.9)
NEUT#: 2.6 10*3/uL (ref 1.5–6.5)
RBC: 4.34 10*6/uL (ref 3.70–5.45)
RDW: 13.6 % (ref 11.2–14.5)
WBC: 3.9 10*3/uL (ref 3.9–10.3)

## 2009-08-30 LAB — COMPREHENSIVE METABOLIC PANEL
Albumin: 3.9 g/dL (ref 3.5–5.2)
BUN: 9 mg/dL (ref 6–23)
CO2: 20 mEq/L (ref 19–32)
Calcium: 8.8 mg/dL (ref 8.4–10.5)
Chloride: 106 mEq/L (ref 96–112)
Glucose, Bld: 107 mg/dL — ABNORMAL HIGH (ref 70–99)
Potassium: 3.6 mEq/L (ref 3.5–5.3)
Sodium: 138 mEq/L (ref 135–145)
Total Protein: 6.6 g/dL (ref 6.0–8.3)

## 2009-11-25 ENCOUNTER — Emergency Department (HOSPITAL_COMMUNITY): Admission: EM | Admit: 2009-11-25 | Discharge: 2009-11-25 | Payer: Self-pay | Admitting: Emergency Medicine

## 2010-01-02 ENCOUNTER — Ambulatory Visit (HOSPITAL_COMMUNITY): Admission: RE | Admit: 2010-01-02 | Discharge: 2010-01-02 | Payer: Self-pay | Admitting: Orthopedic Surgery

## 2010-02-08 ENCOUNTER — Ambulatory Visit: Payer: Self-pay | Admitting: Oncology

## 2010-02-12 LAB — CBC WITH DIFFERENTIAL/PLATELET
BASO%: 0.9 % (ref 0.0–2.0)
LYMPH%: 30.4 % (ref 14.0–49.7)
MCHC: 34.9 g/dL (ref 31.5–36.0)
MCV: 93.6 fL (ref 79.5–101.0)
MONO%: 7.8 % (ref 0.0–14.0)
Platelets: 196 10*3/uL (ref 145–400)
RBC: 4.48 10*6/uL (ref 3.70–5.45)
WBC: 3.5 10*3/uL — ABNORMAL LOW (ref 3.9–10.3)

## 2010-02-12 LAB — COMPREHENSIVE METABOLIC PANEL
ALT: 24 U/L (ref 0–35)
Alkaline Phosphatase: 79 U/L (ref 39–117)
Sodium: 139 mEq/L (ref 135–145)
Total Bilirubin: 0.5 mg/dL (ref 0.3–1.2)
Total Protein: 7.1 g/dL (ref 6.0–8.3)

## 2010-03-05 ENCOUNTER — Emergency Department (HOSPITAL_COMMUNITY)
Admission: EM | Admit: 2010-03-05 | Discharge: 2010-03-05 | Payer: Self-pay | Source: Home / Self Care | Admitting: Emergency Medicine

## 2010-04-01 HISTORY — PX: TENDON RELEASE: SHX230

## 2010-04-21 ENCOUNTER — Other Ambulatory Visit: Payer: Self-pay | Admitting: Radiation Oncology

## 2010-04-21 ENCOUNTER — Encounter: Payer: Self-pay | Admitting: Oncology

## 2010-04-21 DIAGNOSIS — Z9889 Other specified postprocedural states: Secondary | ICD-10-CM

## 2010-05-23 ENCOUNTER — Ambulatory Visit: Payer: Self-pay

## 2010-05-24 ENCOUNTER — Ambulatory Visit: Payer: Self-pay | Attending: Radiation Oncology | Admitting: Radiation Oncology

## 2010-06-14 LAB — SURGICAL PCR SCREEN: Staphylococcus aureus: NEGATIVE

## 2010-06-14 LAB — BASIC METABOLIC PANEL
CO2: 20 mEq/L (ref 19–32)
Calcium: 8.9 mg/dL (ref 8.4–10.5)
Creatinine, Ser: 0.79 mg/dL (ref 0.4–1.2)
Glucose, Bld: 102 mg/dL — ABNORMAL HIGH (ref 70–99)

## 2010-06-14 LAB — CBC
Hemoglobin: 15.5 g/dL — ABNORMAL HIGH (ref 12.0–15.0)
MCH: 32.4 pg (ref 26.0–34.0)
MCHC: 35.7 g/dL (ref 30.0–36.0)
Platelets: 231 10*3/uL (ref 150–400)

## 2010-06-17 LAB — CBC
HCT: 41.1 % (ref 36.0–46.0)
Hemoglobin: 13.9 g/dL (ref 12.0–15.0)
MCHC: 33.9 g/dL (ref 30.0–36.0)
RDW: 14.4 % (ref 11.5–15.5)

## 2010-06-17 LAB — DIFFERENTIAL
Basophils Relative: 0 % (ref 0–1)
Eosinophils Absolute: 0.2 10*3/uL (ref 0.0–0.7)
Eosinophils Relative: 4 % (ref 0–5)
Lymphocytes Relative: 23 % (ref 12–46)
Neutro Abs: 2.5 10*3/uL (ref 1.7–7.7)

## 2010-06-17 LAB — BLOOD GAS, ARTERIAL
Bicarbonate: 21.6 mEq/L (ref 20.0–24.0)
Drawn by: 232811
Patient temperature: 98.6
pH, Arterial: 7.409 — ABNORMAL HIGH (ref 7.350–7.400)

## 2010-06-17 LAB — BASIC METABOLIC PANEL
BUN: 8 mg/dL (ref 6–23)
GFR calc non Af Amer: >60 mL/min (ref >60)
Glucose, Bld: 117 mg/dL — ABNORMAL HIGH (ref 70–99)
Potassium: 3.1 mEq/L — ABNORMAL LOW (ref 3.5–5.1)

## 2010-06-17 LAB — POCT CARDIAC MARKERS: CKMB, poc: 2.2 ng/mL (ref 1.0–8.0)

## 2010-06-17 LAB — PROTIME-INR: Prothrombin Time: 12.8 seconds (ref 11.6–15.2)

## 2010-06-19 LAB — COMPREHENSIVE METABOLIC PANEL
AST: 36 U/L (ref 0–37)
Albumin: 3.6 g/dL (ref 3.5–5.2)
BUN: 8 mg/dL (ref 6–23)
Chloride: 109 mEq/L (ref 96–112)
Creatinine, Ser: 1.07 mg/dL (ref 0.4–1.2)
GFR calc Af Amer: >60 mL/min (ref >60)
Potassium: 3.6 mEq/L (ref 3.5–5.1)
Total Bilirubin: 0.4 mg/dL (ref 0.3–1.2)
Total Protein: 7.1 g/dL (ref 6.0–8.3)

## 2010-06-19 LAB — BASIC METABOLIC PANEL
BUN: 5 mg/dL — ABNORMAL LOW (ref 6–23)
CO2: 20 mEq/L (ref 19–32)
Chloride: 107 mEq/L (ref 96–112)
Glucose, Bld: 125 mg/dL — ABNORMAL HIGH (ref 70–99)
Potassium: 2.6 mEq/L — CL (ref 3.5–5.1)

## 2010-06-19 LAB — CBC
HCT: 39.7 % (ref 36.0–46.0)
Hemoglobin: 13.7 g/dL (ref 12.0–15.0)
MCV: 94.1 fL (ref 78.0–100.0)
MCV: 95.1 fL (ref 78.0–100.0)
Platelets: 142 10*3/uL — ABNORMAL LOW (ref 150–400)
Platelets: 148 10*3/uL — ABNORMAL LOW (ref 150–400)
RDW: 14.6 % (ref 11.5–15.5)
RDW: 14.7 % (ref 11.5–15.5)
WBC: 4.8 10*3/uL (ref 4.0–10.5)

## 2010-06-19 LAB — DIFFERENTIAL
Basophils Absolute: 0 10*3/uL (ref 0.0–0.1)
Basophils Absolute: 0 10*3/uL (ref 0.0–0.1)
Eosinophils Absolute: 0.1 10*3/uL (ref 0.0–0.7)
Eosinophils Relative: 0 % (ref 0–5)
Eosinophils Relative: 2 % (ref 0–5)
Lymphocytes Relative: 3 % — ABNORMAL LOW (ref 12–46)
Monocytes Absolute: 0 10*3/uL — ABNORMAL LOW (ref 0.1–1.0)
Monocytes Absolute: 0.4 10*3/uL (ref 0.1–1.0)
Monocytes Relative: 1 % — ABNORMAL LOW (ref 3–12)
Neutro Abs: 4.7 10*3/uL (ref 1.7–7.7)

## 2010-06-19 LAB — GLUCOSE, CAPILLARY
Glucose-Capillary: 131 mg/dL — ABNORMAL HIGH (ref 70–99)
Glucose-Capillary: 170 mg/dL — ABNORMAL HIGH (ref 70–99)
Glucose-Capillary: 214 mg/dL — ABNORMAL HIGH (ref 70–99)
Glucose-Capillary: 235 mg/dL — ABNORMAL HIGH (ref 70–99)

## 2010-06-20 LAB — BASIC METABOLIC PANEL
CO2: 20 mEq/L (ref 19–32)
Calcium: 8.7 mg/dL (ref 8.4–10.5)
Creatinine, Ser: 0.89 mg/dL (ref 0.4–1.2)
GFR calc Af Amer: >60 mL/min (ref >60)
GFR calc non Af Amer: >60 mL/min (ref >60)
Sodium: 139 mEq/L (ref 135–145)

## 2010-06-20 LAB — CBC
Hemoglobin: 13.7 g/dL (ref 12.0–15.0)
MCHC: 34.5 g/dL (ref 30.0–36.0)
RBC: 4.34 MIL/uL (ref 3.87–5.11)

## 2010-06-20 LAB — D-DIMER, QUANTITATIVE: D-Dimer, Quant: 0.58 ug/mL-FEU — ABNORMAL HIGH (ref 0.00–0.48)

## 2010-06-25 LAB — BASIC METABOLIC PANEL
Calcium: 8.6 mg/dL (ref 8.4–10.5)
Chloride: 109 mEq/L (ref 96–112)
Creatinine, Ser: 0.74 mg/dL (ref 0.4–1.2)
GFR calc Af Amer: >60 mL/min (ref >60)
Sodium: 139 mEq/L (ref 135–145)

## 2010-06-25 LAB — POCT HEMOGLOBIN-HEMACUE: Hemoglobin: 14.9 g/dL (ref 12.0–15.0)

## 2010-07-04 LAB — BASIC METABOLIC PANEL
BUN: 11 mg/dL (ref 6–23)
CO2: 23 mEq/L (ref 19–32)
Calcium: 8.6 mg/dL (ref 8.4–10.5)
Creatinine, Ser: 0.73 mg/dL (ref 0.4–1.2)
GFR calc Af Amer: >60 mL/min (ref >60)
Glucose, Bld: 124 mg/dL — ABNORMAL HIGH (ref 70–99)

## 2010-07-04 LAB — DIFFERENTIAL
Eosinophils Absolute: 0.1 10*3/uL (ref 0.0–0.7)
Eosinophils Relative: 3 % (ref 0–5)
Neutro Abs: 1.4 10*3/uL — ABNORMAL LOW (ref 1.7–7.7)
Neutrophils Relative %: 43 % (ref 43–77)

## 2010-07-04 LAB — URINALYSIS, ROUTINE W REFLEX MICROSCOPIC
Hgb urine dipstick: NEGATIVE
Protein, ur: NEGATIVE mg/dL
Urobilinogen, UA: 0.2 mg/dL (ref 0.0–1.0)

## 2010-07-04 LAB — PREGNANCY, URINE: Preg Test, Ur: NEGATIVE

## 2010-07-04 LAB — CBC
MCHC: 34.3 g/dL (ref 30.0–36.0)
Platelets: 238 10*3/uL (ref 150–400)
RBC: 3.51 MIL/uL — ABNORMAL LOW (ref 3.87–5.11)
RDW: 18.3 % — ABNORMAL HIGH (ref 11.5–15.5)

## 2010-07-06 LAB — CK TOTAL AND CKMB (NOT AT ARMC): CK, MB: 1.2 ng/mL (ref 0.3–4.0)

## 2010-07-06 LAB — COMPREHENSIVE METABOLIC PANEL
ALT: 31 U/L (ref 0–35)
Alkaline Phosphatase: 60 U/L (ref 39–117)
Chloride: 105 mEq/L (ref 96–112)
Glucose, Bld: 100 mg/dL — ABNORMAL HIGH (ref 70–99)
Potassium: 3.8 mEq/L (ref 3.5–5.1)
Sodium: 137 mEq/L (ref 135–145)
Total Bilirubin: 0.5 mg/dL (ref 0.3–1.2)
Total Protein: 7.7 g/dL (ref 6.0–8.3)

## 2010-07-06 LAB — URINALYSIS, ROUTINE W REFLEX MICROSCOPIC
Bilirubin Urine: NEGATIVE
Glucose, UA: NEGATIVE mg/dL
Glucose, UA: NEGATIVE mg/dL
Hgb urine dipstick: NEGATIVE
Hgb urine dipstick: NEGATIVE
Nitrite: NEGATIVE
Protein, ur: 30 mg/dL — AB
Specific Gravity, Urine: 1.026 (ref 1.005–1.030)
pH: 5.5 (ref 5.0–8.0)

## 2010-07-06 LAB — DIFFERENTIAL
Basophils Relative: 1 % (ref 0–1)
Basophils Relative: 1 % (ref 0–1)
Eosinophils Absolute: 0.1 10*3/uL (ref 0.0–0.7)
Eosinophils Absolute: 0.2 10*3/uL (ref 0.0–0.7)
Monocytes Absolute: 0.2 10*3/uL (ref 0.1–1.0)
Monocytes Absolute: 0.2 10*3/uL (ref 0.1–1.0)
Monocytes Relative: 4 % (ref 3–12)
Monocytes Relative: 4 % (ref 3–12)
Neutro Abs: 2.6 10*3/uL (ref 1.7–7.7)
Neutrophils Relative %: 61 % (ref 43–77)

## 2010-07-06 LAB — CBC
HCT: 41 % (ref 36.0–46.0)
Hemoglobin: 13.3 g/dL (ref 12.0–15.0)
Hemoglobin: 14 g/dL (ref 12.0–15.0)
MCHC: 34.1 g/dL (ref 30.0–36.0)
MCHC: 34.1 g/dL (ref 30.0–36.0)
MCV: 92.6 fL (ref 78.0–100.0)
MCV: 93.3 fL (ref 78.0–100.0)
RBC: 4.17 MIL/uL (ref 3.87–5.11)
RBC: 4.43 MIL/uL (ref 3.87–5.11)

## 2010-07-06 LAB — BASIC METABOLIC PANEL
CO2: 22 mEq/L (ref 19–32)
Chloride: 112 mEq/L (ref 96–112)
GFR calc Af Amer: >60 mL/min (ref >60)
Potassium: 3.4 mEq/L — ABNORMAL LOW (ref 3.5–5.1)
Sodium: 142 mEq/L (ref 135–145)

## 2010-07-06 LAB — POCT CARDIAC MARKERS: Troponin i, poc: <0.05 ng/mL (ref 0.00–0.09)

## 2010-07-06 LAB — POCT I-STAT, CHEM 8
Calcium, Ion: 1.14 mmol/L (ref 1.12–1.32)
Chloride: 111 mEq/L (ref 96–112)
Glucose, Bld: 97 mg/dL (ref 70–99)
HCT: 42 % (ref 36.0–46.0)

## 2010-07-07 LAB — CBC
HCT: 39.5 % (ref 36.0–46.0)
Hemoglobin: 13.2 g/dL (ref 12.0–15.0)
MCV: 94.8 fL (ref 78.0–100.0)
RBC: 4.17 MIL/uL (ref 3.87–5.11)
WBC: 5.8 10*3/uL (ref 4.0–10.5)

## 2010-07-07 LAB — BASIC METABOLIC PANEL
BUN: 6 mg/dL (ref 6–23)
Chloride: 103 mEq/L (ref 96–112)
Potassium: 3.6 mEq/L (ref 3.5–5.1)
Sodium: 139 mEq/L (ref 135–145)

## 2010-07-08 LAB — CBC
HCT: 36.8 % (ref 36.0–46.0)
Hemoglobin: 12.6 g/dL (ref 12.0–15.0)
MCV: 94.3 fL (ref 78.0–100.0)
MCV: 96.1 fL (ref 78.0–100.0)
Platelets: 201 10*3/uL (ref 150–400)
RBC: 4.7 MIL/uL (ref 3.87–5.11)
RDW: 13.7 % (ref 11.5–15.5)
WBC: 6.2 10*3/uL (ref 4.0–10.5)

## 2010-07-08 LAB — BASIC METABOLIC PANEL
BUN: 9 mg/dL (ref 6–23)
CO2: 21 mEq/L (ref 19–32)
Chloride: 116 mEq/L — ABNORMAL HIGH (ref 96–112)
Glucose, Bld: 101 mg/dL — ABNORMAL HIGH (ref 70–99)
Potassium: 3.6 mEq/L (ref 3.5–5.1)

## 2010-07-08 LAB — DIFFERENTIAL
Basophils Absolute: 0 10*3/uL (ref 0.0–0.1)
Basophils Absolute: 0.2 10*3/uL — ABNORMAL HIGH (ref 0.0–0.1)
Eosinophils Absolute: 0.2 10*3/uL (ref 0.0–0.7)
Eosinophils Absolute: 0.4 10*3/uL (ref 0.0–0.7)
Eosinophils Relative: 3 % (ref 0–5)
Eosinophils Relative: 7 % — ABNORMAL HIGH (ref 0–5)
Monocytes Absolute: 0.4 10*3/uL (ref 0.1–1.0)

## 2010-07-08 LAB — URINALYSIS, ROUTINE W REFLEX MICROSCOPIC
Hgb urine dipstick: NEGATIVE
Nitrite: NEGATIVE
Specific Gravity, Urine: 1.026 (ref 1.005–1.030)
Urobilinogen, UA: 0.2 mg/dL (ref 0.0–1.0)

## 2010-07-08 LAB — COMPREHENSIVE METABOLIC PANEL
ALT: 92 U/L — ABNORMAL HIGH (ref 0–35)
AST: 99 U/L — ABNORMAL HIGH (ref 0–37)
Alkaline Phosphatase: 69 U/L (ref 39–117)
CO2: 21 mEq/L (ref 19–32)
Chloride: 106 mEq/L (ref 96–112)
GFR calc Af Amer: >60 mL/min (ref >60)
GFR calc non Af Amer: >60 mL/min (ref >60)
Sodium: 139 mEq/L (ref 135–145)
Total Bilirubin: 1.6 mg/dL — ABNORMAL HIGH (ref 0.3–1.2)

## 2010-07-08 LAB — PREGNANCY, URINE
Preg Test, Ur: NEGATIVE
Preg Test, Ur: NEGATIVE

## 2010-07-11 LAB — POCT I-STAT, CHEM 8
BUN: 6 mg/dL (ref 6–23)
Calcium, Ion: 1.04 mmol/L — ABNORMAL LOW (ref 1.12–1.32)
Chloride: 108 mEq/L (ref 96–112)
HCT: 41 % (ref 36.0–46.0)
Potassium: 3.6 mEq/L (ref 3.5–5.1)
Sodium: 138 mEq/L (ref 135–145)

## 2010-07-11 LAB — URINALYSIS, ROUTINE W REFLEX MICROSCOPIC
Bilirubin Urine: NEGATIVE
Ketones, ur: NEGATIVE mg/dL
Nitrite: NEGATIVE
Urobilinogen, UA: 0.2 mg/dL (ref 0.0–1.0)
pH: 6 (ref 5.0–8.0)

## 2010-07-11 LAB — CBC
HCT: 38.5 % (ref 36.0–46.0)
MCHC: 33.4 g/dL (ref 30.0–36.0)
MCV: 80.8 fL (ref 78.0–100.0)
RBC: 4.76 MIL/uL (ref 3.87–5.11)

## 2010-07-11 LAB — DIFFERENTIAL
Eosinophils Relative: 3 % (ref 0–5)
Lymphs Abs: 1.9 10*3/uL (ref 0.7–4.0)
Monocytes Absolute: 0.5 10*3/uL (ref 0.1–1.0)
Neutro Abs: 3.2 10*3/uL (ref 1.7–7.7)

## 2010-07-11 LAB — APTT: aPTT: 26 seconds (ref 24–37)

## 2010-07-12 LAB — CBC
HCT: 40.4 % (ref 36.0–46.0)
Hemoglobin: 13.5 g/dL (ref 12.0–15.0)
MCV: 80 fL (ref 78.0–100.0)
RBC: 5.05 MIL/uL (ref 3.87–5.11)
WBC: 5.5 10*3/uL (ref 4.0–10.5)

## 2010-07-16 LAB — CBC
HCT: 19.7 % — ABNORMAL LOW (ref 36.0–46.0)
HCT: 20.7 % — ABNORMAL LOW (ref 36.0–46.0)
Hemoglobin: 6.7 g/dL — CL (ref 12.0–15.0)
Hemoglobin: 6.7 g/dL — CL (ref 12.0–15.0)
Hemoglobin: 7.8 g/dL — CL (ref 12.0–15.0)
MCHC: 34 g/dL (ref 30.0–36.0)
MCHC: 34.3 g/dL (ref 30.0–36.0)
MCHC: 34.4 g/dL (ref 30.0–36.0)
MCHC: 35.8 g/dL (ref 30.0–36.0)
MCV: 92.7 fL (ref 78.0–100.0)
MCV: 95.2 fL (ref 78.0–100.0)
Platelets: 161 10*3/uL (ref 150–400)
RBC: 1.97 MIL/uL — ABNORMAL LOW (ref 3.87–5.11)
RBC: 2.24 MIL/uL — ABNORMAL LOW (ref 3.87–5.11)
RBC: 2.41 MIL/uL — ABNORMAL LOW (ref 3.87–5.11)
RDW: 14.3 % (ref 11.5–15.5)
RDW: 14.5 % (ref 11.5–15.5)
WBC: 12.8 10*3/uL — ABNORMAL HIGH (ref 4.0–10.5)
WBC: 13.3 10*3/uL — ABNORMAL HIGH (ref 4.0–10.5)

## 2010-07-16 LAB — CHLAMYDIA PROBE AMPLIFICATION, URINE: Chlamydia, Swab/Urine, PCR: NEGATIVE

## 2010-07-16 LAB — URINE CULTURE
Colony Count: NO GROWTH
Culture: NO GROWTH

## 2010-07-16 LAB — TYPE AND SCREEN

## 2010-08-06 ENCOUNTER — Other Ambulatory Visit: Payer: Self-pay | Admitting: Oncology

## 2010-08-06 ENCOUNTER — Encounter (HOSPITAL_BASED_OUTPATIENT_CLINIC_OR_DEPARTMENT_OTHER): Payer: Self-pay | Admitting: Oncology

## 2010-08-06 DIAGNOSIS — C50419 Malignant neoplasm of upper-outer quadrant of unspecified female breast: Secondary | ICD-10-CM

## 2010-08-06 DIAGNOSIS — Z171 Estrogen receptor negative status [ER-]: Secondary | ICD-10-CM

## 2010-08-06 LAB — COMPREHENSIVE METABOLIC PANEL
ALT: 35 U/L (ref 0–35)
AST: 29 U/L (ref 0–37)
Alkaline Phosphatase: 71 U/L (ref 39–117)
CO2: 21 mEq/L (ref 19–32)
Creatinine, Ser: 0.86 mg/dL (ref 0.40–1.20)
Sodium: 136 mEq/L (ref 135–145)
Total Bilirubin: 0.7 mg/dL (ref 0.3–1.2)
Total Protein: 7.5 g/dL (ref 6.0–8.3)

## 2010-08-06 LAB — CBC WITH DIFFERENTIAL/PLATELET
BASO%: 0.5 % (ref 0.0–2.0)
EOS%: 2.4 % (ref 0.0–7.0)
HCT: 43.8 % (ref 34.8–46.6)
LYMPH%: 30.2 % (ref 14.0–49.7)
MCH: 32.5 pg (ref 25.1–34.0)
MCHC: 34.9 g/dL (ref 31.5–36.0)
MCV: 93.1 fL (ref 79.5–101.0)
MONO%: 7.3 % (ref 0.0–14.0)
NEUT%: 59.6 % (ref 38.4–76.8)
Platelets: 178 10*3/uL (ref 145–400)
RBC: 4.7 10*6/uL (ref 3.70–5.45)

## 2010-08-06 LAB — CANCER ANTIGEN 19-9: CA 19-9: <1.2 U/mL (ref <35.0)

## 2010-08-13 ENCOUNTER — Encounter (HOSPITAL_BASED_OUTPATIENT_CLINIC_OR_DEPARTMENT_OTHER): Payer: Self-pay | Admitting: Oncology

## 2010-08-13 ENCOUNTER — Other Ambulatory Visit: Payer: Self-pay | Admitting: Oncology

## 2010-08-13 DIAGNOSIS — C50919 Malignant neoplasm of unspecified site of unspecified female breast: Secondary | ICD-10-CM

## 2010-08-13 DIAGNOSIS — G609 Hereditary and idiopathic neuropathy, unspecified: Secondary | ICD-10-CM

## 2010-08-13 DIAGNOSIS — C50419 Malignant neoplasm of upper-outer quadrant of unspecified female breast: Secondary | ICD-10-CM

## 2010-08-13 DIAGNOSIS — I10 Essential (primary) hypertension: Secondary | ICD-10-CM

## 2010-08-13 DIAGNOSIS — Z171 Estrogen receptor negative status [ER-]: Secondary | ICD-10-CM

## 2010-08-14 NOTE — Op Note (Signed)
Erica [name], Erica [name]                ACCOUNT NO.:  [id number]   MEDICAL RECORD NO.:  [id number]          PATIENT TYPE:  AMB   LOCATION:  SDS                          FACILITY:  [facility]   PHYSICIAN:  Thomas A. Cornett, M.D.DATE OF BIRTH:  07/14/1975   DATE OF PROCEDURE:  06/29/2008  DATE OF DISCHARGE:  06/29/2008                               OPERATIVE REPORT   PREOPERATIVE DIAGNOSIS:  Large left breast cancer with poor venous  access.   POSTOPERATIVE DIAGNOSIS:  Large left breast cancer with poor venous  access.   PROCEDURE:  Placement of 8-French right subclavian vein PowerPort with  fluoroscopy.   SURGEON:  Maisie Fus A. Cornett, MD   ANESTHESIA:  MAC with 0.25% Sensorcaine local.   ESTIMATED BLOOD LOSS:  50 mL.   SPECIMENS:  None.   INDICATIONS FOR PROCEDURE:  The patient is a 35 year old female with a  large left breast cancer and who requires neoadjuvant chemotherapy.  She  presents today for Port-A-Cath placement for neoadjuvant chemotherapy  due to a large left breast cancer and poor venous access.  The procedure  was discussed with the patient to include complications of bleeding,  infection, pneumothorax, hemothorax, pericardial tamponade, injury to  mediastinal structures to include heart, other blood vessels, nerves,  esophagus, airway.  She agreed to proceed with the procedure.   DESCRIPTION OF PROCEDURE:  The patient was brought to the operating room  and placed supine.  Both arms were tucked and a roll was placed under  her shoulder blades.  After induction of adequate MAC anesthesia, the  upper chest and neck regions were prepped and draped in a sterile  fashion.  The patient was placed in Trendelenburg and the right  subclavian vein was cannulated with return of dark nonpulsatile blood.  Wire was fed through the needle and the needle was withdrawn.  Fluoroscopy showed the wire going into the right atrium and right  ventricle.  The patient was flattened out.  A  small stab incision was  made at the wire exit site.  Just below this, local anesthesia was  infiltrated and a 3-cm incision was made.  Dissection was carried down  until we got to the pectoralis major fascia.  Small pocket was made with  my finger bluntly.  An 8-French PowerPort catheter was assembled and put  together and secured.  I then tunneled the catheter from the lower  incision to the upper incision.  The catheter was cut to about 15 cm  from the skin.  The catheter was flushed prior to this.  The patient was  placed in Trendelenburg.  I then slid the dilator over the wire moving  the wire to-and-fro with no resistance.  I then introduced the  introducer and dilator together over the wire advancing them while  moving the wire to-and-fro and felt no resistance.  I then removed the  dilator and introducer catheters, placed the peel-away sheath into the  vein and the  peel-away sheath was pulled away.  Fluoroscopy showed the  tip of the catheter to be in the superior vena cava and right  atrial  junction, it looked like, with the patient supine under fluoroscopy.  There was no obvious pneumothorax that I could see.  The catheter was  interrogated.  It drew back dark, nonpulsatile dark blood easily and was  flushed with heparinized saline.  I then put 5 mL of 1000 units/mL of  heparinized saline with a saline lock into the Port-A-Cath.  The skin  incisions were then closed with combination of 3-0 Vicryl and subsequent  4-0 Monocryl.  Of note, the catheter was secured to the chest wall with  2-0 Prolene.  After closure of skin, Dermabond was applied to both  incisions.  All final counts of sponge, needle, and instruments were  found to be correct at this portion of case.  The patient was awoken and  taken to recovery in satisfactory condition.  All final counts of  sponge, needle and instruments were found to be correct.      Thomas A. Cornett, M.D.  Electronically Signed      TAC/MEDQ  D:  06/29/2008  T:  06/29/2008  Job:  [id number]   cc:   Valentino Hue. Magrinat, M.D.  Tammy Luellen Pucker

## 2010-08-14 NOTE — Discharge Summary (Signed)
Erica [name], [name] NO.:  [id number]   MEDICAL RECORD NO.:  [id number]          PATIENT TYPE:  INP   LOCATION:                                FACILITY:  [facility]   PHYSICIAN:  Lesly Dukes, M.D. DATE OF BIRTH:  Aug 08, 1975   DATE OF ADMISSION:  05/29/2007  DATE OF DISCHARGE:  06/03/2007                               DISCHARGE SUMMARY   ADMISSION DIAGNOSES:  1. Intrauterine pregnancy at 39 weeks, 0 days.  2. Chronic hypertension.  3. Group B strep negative.  4. Asthma.   DISCHARGE DIAGNOSES:  1. Postpartum day #2 from spontaneous vaginal delivery.  2. Hypertension.  3. Asthma.  4. Group B streptococcus negative.   PROCEDURES:  Spontaneous vaginal delivery on June 01, 2007.   COMPLICATIONS:  None.   CONSULTATIONS:  None.   PERTINENT LABORATORY FINDINGS:  Patient had on admission a complete  blood count showing white blood cell count of 8.7, hemoglobin 11.5,  hematocrit 33.2, platelets 208.  RPR was nonreactive.   BRIEF PERTINENT ADMISSION HISTORY:  Patient is a 35 year old gravida 3,  para 2, 0-0-2, presenting at [redacted] weeks gestation for induction of labor  due to chronic hypertension.  The patient was admitted and found to be 1  cm, 50% effaced, -2 station.  Bishop's score was favorable for Pitocin  induction.   HOSPITAL COURSE:  Patient continued on Pitocin and progressed to  spontaneous vaginal delivery of a viable infant female weighing 7  pounds, 2 ounces with Apgars of 8 at one minute, 9 at five minutes.  The  patient did well during delivery and in the postpartum stage.  Of note,  though, her vitals did show blood pressures ranging from 124 to 146  systolic and 71-86 diastolic.  Patient denied headache, right upper  quadrant pain, or vision changes.  She had not been anything previously  for her chronic hypertension.  Otherwise, her physical examination was  normal.   PHYSICAL EXAMINATION:  HEART:  Regular rate and rhythm.  LUNGS:  Clear to  auscultation bilaterally.  ABDOMEN:  Soft with mild tenderness.  Fundus was firm, below the  umbilicus.  VITALS:  Otherwise stable.   She was stable for discharge.  She was to be started on  hydrochlorothiazide, low dose, for elevated blood pressures.   DISCHARGE STATUS:  Stable.   DISCHARGE MEDICATIONS:  1. Prenatal vitamins 1 tablet by mouth daily.  2. Ferrous sulfate 325 mg 1 tablet by mouth twice daily.  3. Colace 100 mg 1 tablet by mouth daily.  4. Motrin 600 mg 1 tablet by mouth every 6 hours as needed for pain.  5. Hydrochlorothiazide 12.5 mg 1 tablet by mouth daily.   DISCHARGE INSTRUCTIONS:  1. Discharge to home.  2. No sexual activity or nothing per vagina for six weeks.  3. Irregular diet.  4. Patient is to have [facility] nurse follow in 5-7 days for blood      pressure check.  If blood pressure is greater than 140/90, she is      to follow with a doctor for management  of her blood pressure.  5. Patient is to follow up with the [facility]      for postpartum examination in six weeks.      [name], MD  Electronically Signed     ______________________________  Lesly Dukes, M.D.    NS/MEDQ  D:  06/03/2007  T:  06/03/2007  Job:  [id number]

## 2010-08-14 NOTE — Op Note (Signed)
Erica [name], Erica [name]                ACCOUNT NO.:  [id number]   MEDICAL RECORD NO.:  [id number]          PATIENT TYPE:  INP   LOCATION:  9305                          FACILITY:  [facility]   PHYSICIAN:  Norton Blizzard, MD    DATE OF BIRTH:  [birth year]   DATE OF PROCEDURE:  DATE OF DISCHARGE:                               OPERATIVE REPORT   PREOPERATIVE DIAGNOSES:  Retained placenta, postpartum hemorrhage.   POSTOPERATIVE DIAGNOSES:  Retained placenta, postpartum hemorrhage.   PROCEDURE:  Dilation and evacuation.   SURGEON:  Norton Blizzard, MD   IV FLUIDS:  1000 mL of lactated Ringer's.   ESTIMATED BLOOD LOSS:  300 mL; however, a total estimated blood loss  since delivery is about a liter.   INDICATIONS:  The patient is a 35 year old gravida 4, para 3-0-0-3 who  was diagnosed with preterm premature rupture of membranes on April 01, 2008.  The patient was noted to be 17 and 3/7th weeks' gestation.  She  was counseled regarding induction with high-dose Cytotec; however, the  patient was unable to be admitted on April 01, 2008, due to child care  issues.  The plan was for her to return on April 02, 2008, for  induction of labor.  The patient did return as scheduled and her  induction did proceed with Cytotec 400 mcg per vagina.  The patient  received 2 total doses prior to delivery of the infant.  Infant was  noted to be stillborn, Apgars of 0 at 0, weight was 170 grams.  After  delivery, the patient was noted to have a small amount of bleeding and a  retained placenta, 20 units in 20 mL of normal saline was injected into  the umbilical vein and Cytotec was also placed.  After about an hour of  delivery, the patient was reexamined and noted to have very heavy  bleeding.  Of note, the total EBL after delivery was noted to be about  300 mL, but on the check after delivery, she noted to have about 500 mL  of clot and bright red blood per vagina.  The placenta was still noted  to be  retained and she was noted to be tachycardic to to 110s.  Given  her bleeding and symptomatic anemia, the decision was made to proceed  with dilation and evacuation.  The risks of the procedure including  bleeding, infection, injury to surrounding organs, risk of retained  products, and need for additional procedures were explained to the  patient and written informed consent was obtained.   FINDINGS:  Cervix was noted to be dilated about 3 cm.  Placenta was  adherent to the left fundal area, and was able to be removed entirely in  fragments.  Of note, given her bleeding, the patient was given  Methergine 0.2 mg IM x1 and Cytotec 1 g per rectum.  Her hemoglobin was  6.7, and she was ordered to be given 2 units of packed red blood cells  in the PACU.   SPECIMENS:  Placenta which was sent to Pathology.   COMPLICATIONS:  None immediate.   PROCEDURE DETAILS:  The patient was taken to the operating room where  MAC was administered and found to be adequate.  She was then placed in  the dorsal lithotomy position and prepped and draped in a sterile  manner.  A sterile catheter was used to empty her bladder for an  unmeasured amount of yellow clear urine.  Attention was turned to the  patient's pelvis where a speculum was placed in the patient's vagina.  A  tenaculum was placed on the anterior lip of the cervix and a  paracervical block using 1% lidocaine plus epinephrine was injected, a  total of 20 mL was noted.  At this point, the cervix was noted to be 3  cm dilated.  No further dilatation was noted.  The placenta was also  noted to be adherent to the left lateral fundus.  Ring forceps was used  to remove the bulk of the placenta; however, they were adherent  fragments, which were removed using sharp curettage.  The sharp  curettage was done until a gritty surface was noted all over the uterus.  There was mild amount of bleeding that was noted during the procedure,  but the curettage did  confirm that the uterus was completely empty.  Methergine and Cytotec were administered for hemostasis.  Estimated  blood loss was about 300 mL.  All instruments were then removed from the  patient's vagina.  The patient tolerated the procedure well.  Sponge,  instrument, and needle counts were correct x2.  She was taken to the  recovery room awake and in stable condition.      Norton Blizzard, MD  Electronically Signed     UAD/MEDQ  D:  04/02/2008  T:  04/03/2008  Job:  [id number]

## 2010-08-14 NOTE — Discharge Summary (Signed)
Erica [name], Erica [name]                ACCOUNT NO.:  [id number]   MEDICAL RECORD NO.:  [id number]          PATIENT TYPE:  INP   LOCATION:  9305                          FACILITY:  [facility]   PHYSICIAN:  Norton Blizzard, MD    DATE OF BIRTH:  Aug 25, 1975   DATE OF ADMISSION:  04/02/2008  DATE OF DISCHARGE:  04/03/2008                               DISCHARGE SUMMARY   ADMISSION DIAGNOSES:  Preterm premature rupture of membranes at 17-3/7th  weeks' gestation.  The patient desired induction of labor with Cytotec.   DISCHARGE DIAGNOSES:  1. Status post vaginal delivery of 17 week stillborn fetus.  2. Retained placenta and postpartum hemorrhage.  3. Status post dilation and curettage for retained placenta.   PROCEDURES:  1. Dilation and curettage for retained placenta and postpartum      hemorrhage.  2. Transfusion of 2 units of packed red blood cells.   PERTINENT STUDIES:  The patient's hemoglobin went from 6.7-7.1 after  transfusion of 2 units of red blood cells.  It is thought that her first  hemoglobin was not a true value and had not equilibrated to the amount  of blood loss that the patient had.  However, she was asymptomatic on  the day of discharge and did not require further transfusions.   BRIEF HOSPITAL COURSE:  The patient is a 35 year old gravida 4, para 3-0-  0-3, who was diagnosed with PPROM at [redacted] weeks  GA and was admitted for  induction of labor with Cytotec on April 02, 2008.  The patient  received 2 doses of Cytotec and went on to have a vaginal delivery of a  infant that was noted to be stillborn.  After the infant's delivery, the  patient was noted to have a placenta that was retained.  She did have an  intraumbilical vein injection of Pitocin and normal saline and also had  further Cytotec placed to help with the delivery of the placenta, but  this was not noted to occur.  The patient proceeded to have significant  amount of bleeding.  Estimated blood loss prior to going  to the  operating room was about 700 mL, and hemoglobin that was checked at that  point was 6.7.  In the operating room, she underwent an uncomplicated  dilation and curettage to remove the retained placenta.  She lost about  300 mL more of blood.  For further details of this operation, please  refer to separate dictated operative report.  Of note, prior to surgery,  the patient was also noted to be tachycardic in the 110s.  She did  receive 2 units of packed red blood cells while in the recovery room.  The patient had a hemoglobin that was checked 4 hours after transfusion  was 7.8.  A followup hemoglobin on the day of discharge was 7.1.  The  patient was noted to be asymptomatic, her blood pressure was stable, and  her pulse was in the 80s.  She was ambulating without difficulty.  The  patient did not want any further transfusions and given that she was  asymptomatic, this was not indicated.  She was placed on Ferrous sulfate  for her anemia.  By the day of discharge, the patient was having minimal  bleeding per vagina.  She was tolerating her regular diet, ambulating  without difficulty, and had no other concerns.  She did have a consult  with a Child psychotherapist as per protocol for patient who experience a fetal  loss. She was deemed stable for discharge to home.   DISCHARGE MEDICATIONS:  1. Percocet 5/325 mg 1-2 tablets p.o. q.6 h. p.r.n. pain.  2. Ibuprofen 600 mg p.o. q.6 h. p.r.n. pain.  3. Colace 100 mg p.o. b.i.d. p.r.n. constipation.  4. Ferrous sulfate 325 mg p.o. b.i.d.   DISCHARGE INSTRUCTIONS:  The patient was told to avoid sexual  intercourse for the next 4 weeks.  She was also told to call for any  fevers, abnormal pain, or any other concerns or will be seen in the  emergency room.  She will be contacted by the [facility] for a  postoperative evaluation appointment at 3-4 weeks.  Of note, the patient  will get an injection for Depo-Provera IM for contraception prior to   discharge.      Norton Blizzard, MD  Electronically Signed     UAD/MEDQ  D:  04/03/2008  T:  04/03/2008  Job:  [id number]

## 2010-08-14 NOTE — Op Note (Signed)
Erica [name], Erica [name]                ACCOUNT NO.:  [id number]   MEDICAL RECORD NO.:  [id number]          PATIENT TYPE:  AMB   LOCATION:  DAY                          FACILITY:  [facility]   PHYSICIAN:  Thomas A. Cornett, M.D.DATE OF BIRTH:  09-03-1975   DATE OF PROCEDURE:  11/10/2008  DATE OF DISCHARGE:                               OPERATIVE REPORT   PREOPERATIVE DIAGNOSIS:  Stage III left breast cancer.   POSTOPERATIVE DIAGNOSIS:  Stage III left breast cancer.   PROCEDURES:  1. Re-excision left breast lumpectomy.  2. Left axillary lymph node dissection.   ANESTHESIA:  LMA with 0.25% Sensorcaine local.   ESTIMATED BLOOD LOSS:  10 mL.   SPECIMENS:  1. Left axillary contents.  2. Anterior-inferior margin of left breast lumpectomy cavity.   INDICATIONS FOR PROCEDURE:  The patient is a 35 year old female with  diagnosis of left breast cancer approximately 8 months ago.  She  underwent neoadjuvant therapy with lumpectomy and sentinel lymph node  mapping about 2 weeks ago.  Unfortunately her axillary sentinel lymph  node was positive, therefore requiring axillary lymph node dissection.  She also had a closed but uninvolved margin.  I recommended re-excision  of the margin while were there at the same time.  She presents for the  above.   DESCRIPTION OF PROCEDURE:  The patient was brought to the operating room  and placed supine.  After induction of LMA anesthesia, the left chest  and left axilla were prepped and draped in a sterile fashion.  The  previous left upper quadrant breast incision was reopened, in which it  gained exposure to both the axilla and lumpectomy cavity easily.  The  anterior-inferior margin was identified.  It was then excised to about 5-  6 mL and sent to pathology; oriented.   Next, the axillary dissection was done.  We used our scalpel to in and  out the axillary contents.  I then excised all lymphovascular tissue in  between the thoracodorsal trunk, long  thoracic nerve and the axillary  vein.  The structures were preserved.  All lympho areolar tissue was  then sent to pathology as one specimen.  We irrigated this area out.  She was hemostatic.  A 19-inch round Blake drain was placed into the  cavity, and I closed the breast tissue on layers with a deeper layer of  3-0 Vicryl sutures, and 4-0 Monocryl in a subcuticular fashion.  The  drain was secured to the  skin with 3-0 nylon.  It was placed to bulb suction.  Dermabond was  applied.  All final counts of sponge, needle and instruments were found  be correct at this portion of the case.  The patient was then awakened  and taken to recovery room in satisfactory condition.      Thomas A. Cornett, M.D.  Electronically Signed     TAC/MEDQ  D:  11/10/2008  T:  11/10/2008  Job:  [id number]   cc:   Valentino Hue. Magrinat, M.D.  Fax: [phone]

## 2010-08-14 NOTE — H&P (Signed)
NAMETEREZ, Erica NO.:  [id number]   MEDICAL RECORD NO.:  [id number]          PATIENT TYPE:  EMS   LOCATION:  ED                           FACILITY:  [facility]   PHYSICIAN:  Beckey Rutter, MD  DATE OF BIRTH:  May 18, 1975   DATE OF ADMISSION:  01/12/2008  DATE OF DISCHARGE:                              HISTORY & PHYSICAL   PRIMARY CARE PHYSICIAN:  Unassigned.   CHIEF COMPLAINT:  Shortness of breath   HISTORY OF PRESENT ILLNESS:  This is a 35 year old, pleasant, African  American female with a past medical history significant for bronchial  asthma who presented today to the emergency department because of  worsening shortness of breath that started yesterday.  The patient took  her inhalers without any improvement.  In the emergency department, the  patient received treatment with nebulized medication, IV steroid and  oxygen with minimal improvement.  The Hospitalist Service was called to  help with management.  The patient was noticed to have a slight fever of  100.4 while she was in the ED.   PAST MEDICAL HISTORY:  Significant for acute asthma exacerbation without  history of intubation.   SOCIAL HISTORY:  The patient denied drug abuse, ethanol abuse or tobacco  abuse/smoking.   FAMILY HISTORY:  Significant for diabetes, hyperlipidemia and  hypertension on the grandmother's side.   OB/GYN HISTORY:  The patient has a history of Depo-Provera use - had  fibroid and abnormal Pap smear.  History of human papilloma virus  infection.  Hypertension during pregnancies.  The patient has three  children and lives with her children currently.   MEDICATIONS:  1. Flovent 2 puffs as needed.  2. Advair Diskus 2 puffs once a day.  3. Albuterol 2 puffs every 4 hours.  4. Motrin 800 mg p.r.n.   MEDICATION ALLERGIES:  Not known to have medication allergy.   REVIEW OF SYSTEMS:  The patient denied fever, headache yesterday.  A 12-  point review of systems is otherwise  noncontributory/not pertinent.   PHYSICAL EXAMINATION:  VITAL SIGNS:  T-max in the ER is 100.4.  Current  temperature is 98.9.  Blood pressure 173/95.  Pulse is 115.  Respiratory  rate is 20.  HEENT:  Head:  Atraumatic, normocephalic.  Eyes:  PERRL.  Mouth:  Moist.  No ulcer.  NECK:  Supple.  No JVD.  LUNGS:  Bilateral scattered wheezing, mainly expiratory.  PRECORDIUM EXAMINATION:  Significant for cardiac sound S1 and S2.  No  added sound appreciated.  Tachycardia.  ABDOMEN:  Soft, nontender.  Bowel sounds are present.  EXTREMITIES:  No lower extremities edema.  NEUROLOGIC:  She is alert, oriented and moving all her extremities  spontaneously.   LABS AND X-RAY:  Chest x-ray showing no acute cardiopulmonary disease.  Lab tests are not performed yet.   ASSESSMENT:  This is a 35 year old with acute exacerbation of asthma.   PLAN:  1. The patient will be admitted for further assessment and management.  2. We will continue the patient on nasal cannula oxygen.  3. The patient will be continued on  intravenous steroid.  4. The patient will be continued on nebulized medication and I will      change the albuterol to Xopenex because of tachycardia with the      nebulizations.  5. We will obtain basic metabolic panel and B-MET for completion of      workup.  6. The patient has a slight fever of 100.4, although she does not look      infectious clinically.  Nevertheless, a viral infection cannot be      ruled out totally.  I will keep the patient in isolation for H1N1.      I will send blood culture and urinalysis.  7. Further management as per the result of the test orders and the      hospital course.      Beckey Rutter, MD  Electronically Signed     EME/MEDQ  D:  01/13/2008  T:  01/13/2008  Job:  [id number]

## 2010-08-14 NOTE — Op Note (Signed)
NAMEDEVETTA, [name]                ACCOUNT NO.:  [id number]   MEDICAL RECORD NO.:  [id number]          PATIENT TYPE:  AMB   LOCATION:  DSC                          FACILITY:  [facility]   PHYSICIAN:  Thomas A. Cornett, M.D.DATE OF BIRTH:  1976-02-12   DATE OF PROCEDURE:  10/27/2008  DATE OF DISCHARGE:                               OPERATIVE REPORT   PREOPERATIVE DIAGNOSIS:  Left breast cancer.   POSTOPERATIVE DIAGNOSIS:  Left breast cancer.   PROCEDURES:  1. Left breast partial mastectomy with wire localization.  2. Left axillary sentinel lymph node mapping with injection of      methylene blue dye.   SURGEON:  Maisie Fus A. Cornett, MD   ANESTHESIA:  LMA with 0.25% Sensorcaine local.   ESTIMATED BLOOD LOSS:  20 mL.   DRAIN:  A 19-French drain to lumpectomy cavity.   SPECIMEN:  1. Left breast mass.  2. Three left axillary sentinel lymph nodes.  Two were normal.  The      third had some atypical cells but no definitive cancer.   INDICATIONS FOR PROCEDURE:  The patient is a 35 year old female who has  completed neoadjuvant therapy for T3 N0 Mx left breast cancer.  She has  had about 50% reduction in size and presents today for lumpectomy for  this.   DESCRIPTION OF PROCEDURE:  After undergoing wire localization and  sentinel lymph node mapping with injection of technetium sulfur colloid,  the patient was brought back to the operating room.  After induction of  anesthesia, the left breast was prepped and draped in sterile fashion.  There were 2 wires in the breast that bracketed the lesion in the left  upper outer quadrant of the breast.  An ellipse of skin was taken around  the wires in a radial fashion toward the axilla.  Prior to this, 4 mL of  methylene blue dye were injected in subareolar position for sentinel  lymph node mapping.  We removed the mass in its entirety.  One of the  wires came out, we put this in the specimen container, so we would not  lose it and the other  wire stayed in place.  The entire mass was  removed.  I also took some additional medial margin as well.  Neoprobe  was then used, and the left sentinel node was actually within the  specimen of the lumpectomy.  I dissected this out and sent separately.  There were 2 lymph nodes that were sent as well.  This area was  irrigated out.  Touch prep revealed questionable atypical cells in one  of the nodes, so we decided they could not call a cancer, I would not to  do any further dissection at this point.  Through a separate stab  incision, a 19 Blake drain was placed down the pectoralis major fascia.  I then mobilized the breast off the chest wall for about 3-4 cm  circumferentially and then closed this over the drain with 3-0 Vicryl.  A 4-0 Monocryl was then used to close the skin in subcuticular fashion.  Dermabond was applied.  Drain was secured to the skin with 2-0  nylon.  Dry dressing was applied to the drain.  All final counts of  sponge, needle, and instruments were found to be correct for this  portion of the case.  The patient was then awoke, taken to the recovery  room in satisfactory condition.      Thomas A. Cornett, M.D.  Electronically Signed     TAC/MEDQ  D:  10/27/2008  T:  10/28/2008  Job:  [id number]   cc:   Valentino Hue. Magrinat, M.D.

## 2010-08-17 NOTE — Discharge Summary (Signed)
Erica [name], Erica [name]                ACCOUNT NO.:  [id number]   MEDICAL RECORD NO.:  [id number]          PATIENT TYPE:  INP   LOCATION:  1521                         FACILITY:  [facility]   PHYSICIAN:  Beckey Rutter, MD  DATE OF BIRTH:  1975-09-06   DATE OF ADMISSION:  01/12/2008  DATE OF DISCHARGE:  01/14/2008                               DISCHARGE SUMMARY   PRIMARY CARE PHYSICIAN:  Unassigned.   CHIEF COMPLAINT:  Shortness of breath.   BRIEF HISTORY OF PRESENT ILLNESS AND HOSPITAL COURSE:  This is a 35-year-  old pleasant African American female with past medical history of  obesity and bronchial asthma, admitted because of acute exacerbation  asthma.   HOSPITAL COURSE:  1. During hospital course, the patient received intravenous steroid      and she was continued on oxygen and nebulized medication.  Patient      remained without fever and she was eventually stable for discharge      with no clinical wheezing.  2. Upon admission, it was recorded that the patient had a fever of      100.4.  The patient's vitals remained stable and she remained      afebrile during the hospital course.  The patient did not      necessitate any antibiotic medication.   DISCHARGE DIAGNOSES:  1. Acute exacerbation of asthma.  2. Obesity.   DISCHARGE MEDICATION:  1. Flovent 2 puffs p.r.n.  2. Albuterol 2 puffs every 4 hours p.r.n.  3. Advair Diskus 2 puffs daily.  4. Tapering dose of steroid.   DISCHARGE PLAN:  Patient is discharged today to follow up with her  primary physician for further long-term asthma management.  She is aware  and agreeable to the discharge plan.   ADDENDUM:  Received a call from labs today with positive one of the  blood culture.  The blood culture was showing gram-positive rods.  The  2nd blood culture which was drawn at the same time is negative.  This is  likely a contaminant and in fact I called Ms Hun at home today,  January 18, 2008, at the number [phone]  to ask about her condition.  She verified that she has not any fevers since discharge.  She was also  denying any shortness of  breath.  She was advised to return back to the emergency department in  case of fever and the results of the blood culture were explained to  her.  Her questions were encouraged and answered.  No further action  will be taken in regard to this gram-positive rods culture since it is  likely a contaminant.      Beckey Rutter, MD  Electronically Signed     EME/MEDQ  D:  01/18/2008  T:  01/18/2008  Job:  [id number]

## 2010-08-22 ENCOUNTER — Ambulatory Visit
Admission: RE | Admit: 2010-08-22 | Discharge: 2010-08-22 | Disposition: A | Discharge status: Home / Self Care | Payer: Self-pay | Source: Ambulatory Visit | Attending: Radiation Oncology | Admitting: Radiation Oncology

## 2010-08-22 DIAGNOSIS — Z9889 Other specified postprocedural states: Secondary | ICD-10-CM

## 2010-08-23 ENCOUNTER — Encounter (HOSPITAL_COMMUNITY)
Admission: RE | Admit: 2010-08-23 | Discharge: 2010-08-23 | Disposition: A | Discharge status: Home / Self Care | Payer: Self-pay | Source: Ambulatory Visit | Attending: Oncology | Admitting: Oncology

## 2010-08-23 ENCOUNTER — Ambulatory Visit: Payer: Self-pay | Admitting: Radiation Oncology

## 2010-08-23 ENCOUNTER — Encounter (HOSPITAL_COMMUNITY): Payer: Self-pay

## 2010-08-23 ENCOUNTER — Ambulatory Visit (HOSPITAL_COMMUNITY): Payer: Self-pay

## 2010-08-23 DIAGNOSIS — C50919 Malignant neoplasm of unspecified site of unspecified female breast: Secondary | ICD-10-CM

## 2010-08-23 MED ORDER — TECHNETIUM TC 99M MEDRONATE IV KIT
25.0000 | PACK | Freq: Once | INTRAVENOUS | Status: AC | PRN
  Administered 2010-08-23: 22.8 via INTRAVENOUS

## 2010-08-23 MED ORDER — IOHEXOL 300 MG/ML  SOLN
100.0000 mL | Freq: Once | INTRAMUSCULAR | Status: AC | PRN
  Administered 2010-08-23: 100 mL via INTRAVENOUS

## 2010-08-24 ENCOUNTER — Ambulatory Visit (HOSPITAL_COMMUNITY)
Admission: RE | Admit: 2010-08-24 | Discharge: 2010-08-24 | Disposition: A | Discharge status: Home / Self Care | Payer: Self-pay | Source: Ambulatory Visit | Attending: Oncology | Admitting: Oncology

## 2010-08-24 DIAGNOSIS — R220 Localized swelling, mass and lump, head: Secondary | ICD-10-CM | POA: Insufficient documentation

## 2010-08-24 DIAGNOSIS — R51 Headache: Secondary | ICD-10-CM | POA: Insufficient documentation

## 2010-08-24 DIAGNOSIS — R209 Unspecified disturbances of skin sensation: Secondary | ICD-10-CM | POA: Insufficient documentation

## 2010-08-24 DIAGNOSIS — G9389 Other specified disorders of brain: Secondary | ICD-10-CM | POA: Insufficient documentation

## 2010-08-24 DIAGNOSIS — R110 Nausea: Secondary | ICD-10-CM | POA: Insufficient documentation

## 2010-08-24 DIAGNOSIS — C50919 Malignant neoplasm of unspecified site of unspecified female breast: Secondary | ICD-10-CM | POA: Insufficient documentation

## 2010-08-24 DIAGNOSIS — H539 Unspecified visual disturbance: Secondary | ICD-10-CM | POA: Insufficient documentation

## 2010-08-24 MED ORDER — GADOBENATE DIMEGLUMINE 529 MG/ML IV SOLN
18.0000 mL | Freq: Once | INTRAVENOUS | Status: AC | PRN
  Administered 2010-08-24: 18 mL via INTRAVENOUS

## 2010-08-29 ENCOUNTER — Encounter (HOSPITAL_BASED_OUTPATIENT_CLINIC_OR_DEPARTMENT_OTHER): Payer: Self-pay | Admitting: Oncology

## 2010-08-29 DIAGNOSIS — C50419 Malignant neoplasm of upper-outer quadrant of unspecified female breast: Secondary | ICD-10-CM

## 2010-08-29 DIAGNOSIS — R51 Headache: Secondary | ICD-10-CM

## 2010-08-29 DIAGNOSIS — Z171 Estrogen receptor negative status [ER-]: Secondary | ICD-10-CM

## 2010-08-29 DIAGNOSIS — G609 Hereditary and idiopathic neuropathy, unspecified: Secondary | ICD-10-CM

## 2010-09-11 ENCOUNTER — Ambulatory Visit (HOSPITAL_COMMUNITY)
Admission: RE | Admit: 2010-09-11 | Discharge: 2010-09-11 | Disposition: A | Discharge status: Home / Self Care | Payer: Self-pay | Source: Ambulatory Visit | Attending: Family Medicine | Admitting: Family Medicine

## 2010-09-11 DIAGNOSIS — J45909 Unspecified asthma, uncomplicated: Secondary | ICD-10-CM | POA: Insufficient documentation

## 2010-09-27 ENCOUNTER — Ambulatory Visit (INDEPENDENT_AMBULATORY_CARE_PROVIDER_SITE_OTHER): Payer: Self-pay | Admitting: Surgery

## 2010-09-27 ENCOUNTER — Encounter (INDEPENDENT_AMBULATORY_CARE_PROVIDER_SITE_OTHER): Payer: Self-pay | Admitting: Surgery

## 2010-09-27 DIAGNOSIS — M792 Neuralgia and neuritis, unspecified: Secondary | ICD-10-CM | POA: Insufficient documentation

## 2010-09-27 DIAGNOSIS — G620 Drug-induced polyneuropathy: Secondary | ICD-10-CM

## 2010-09-27 DIAGNOSIS — C50919 Malignant neoplasm of unspecified site of unspecified female breast: Secondary | ICD-10-CM

## 2010-09-27 DIAGNOSIS — O10019 Pre-existing essential hypertension complicating pregnancy, unspecified trimester: Secondary | ICD-10-CM | POA: Insufficient documentation

## 2010-09-27 DIAGNOSIS — J45991 Cough variant asthma: Secondary | ICD-10-CM | POA: Insufficient documentation

## 2010-09-27 DIAGNOSIS — J45909 Unspecified asthma, uncomplicated: Secondary | ICD-10-CM

## 2010-09-27 DIAGNOSIS — I10 Essential (primary) hypertension: Secondary | ICD-10-CM

## 2010-09-27 HISTORY — DX: Cough variant asthma: J45.991

## 2010-09-27 HISTORY — DX: Drug-induced polyneuropathy: G62.0

## 2010-09-27 NOTE — Patient Instructions (Signed)
Return to clinic in September 2012 for a recheck of your breast cancer and hidradenitis of your axilla.

## 2010-09-27 NOTE — Progress Notes (Signed)
Subjective:     Patient ID: MCKINSLEY KOELZER, female   DOB: March 25, 1976, 35 y.o.   MRN: [id number]    There were no vitals taken for this visit.    HPI  The patient returns to clinic today. I saw her about a month ago due to hidradenitis of both axillary regions. She has a history of stage II breast cancer on the left. I saw her a month ago with redness, drainage, and swelling of her right axillary region. She also has an area in her left axilla similar to that. She was treated with antibiotics this is improved.   Review of Systems  Constitutional: Negative.   HENT: Negative.   Eyes: Negative.   Respiratory: Negative.   Cardiovascular: Negative.   Gastrointestinal: Negative.   Genitourinary: Negative.   Musculoskeletal: Positive for myalgias, joint swelling and arthralgias.  Skin: Positive for wound.       Objective:   Physical Exam  Constitutional: She is oriented to person, place, and time. She appears well-developed and well-nourished.  HENT:  Head: Normocephalic and atraumatic.  Eyes: Conjunctivae and EOM are normal. Pupils are equal, round, and reactive to light.  Neck: Normal range of motion. Neck supple.  Pulmonary/Chest: Effort normal and breath sounds normal.  Musculoskeletal: She exhibits tenderness.  Lymphadenopathy:    She has no cervical adenopathy.  Neurological: She is alert and oriented to person, place, and time.  Skin:       Mild hidradenitis bilateral axilla.  No redness,  Drainage or flucutance noted.       Assessment:   Hidradenitis Bilateral axilla Plan:   Follow up in September for recheck.  Continue breast cancer follow up.  If symptoms return ,  Please call to schedule an appointment.

## 2010-10-18 ENCOUNTER — Emergency Department (HOSPITAL_COMMUNITY)
Admission: EM | Admit: 2010-10-18 | Discharge: 2010-10-18 | Disposition: A | Discharge status: Home / Self Care | Payer: Self-pay | Attending: Emergency Medicine | Admitting: Emergency Medicine

## 2010-10-18 DIAGNOSIS — R21 Rash and other nonspecific skin eruption: Secondary | ICD-10-CM | POA: Insufficient documentation

## 2010-10-18 DIAGNOSIS — L989 Disorder of the skin and subcutaneous tissue, unspecified: Secondary | ICD-10-CM | POA: Insufficient documentation

## 2010-10-18 DIAGNOSIS — Z853 Personal history of malignant neoplasm of breast: Secondary | ICD-10-CM | POA: Insufficient documentation

## 2010-10-18 DIAGNOSIS — I10 Essential (primary) hypertension: Secondary | ICD-10-CM | POA: Insufficient documentation

## 2010-10-20 LAB — WOUND CULTURE
Culture: NO GROWTH
Gram Stain: NONE SEEN

## 2010-11-16 ENCOUNTER — Emergency Department (HOSPITAL_COMMUNITY): Payer: Self-pay

## 2010-11-16 ENCOUNTER — Emergency Department (HOSPITAL_COMMUNITY)
Admission: EM | Admit: 2010-11-16 | Discharge: 2010-11-17 | Disposition: A | Discharge status: Home / Self Care | Payer: Self-pay | Attending: Emergency Medicine | Admitting: Emergency Medicine

## 2010-11-16 DIAGNOSIS — J45909 Unspecified asthma, uncomplicated: Secondary | ICD-10-CM | POA: Insufficient documentation

## 2010-11-16 DIAGNOSIS — Z853 Personal history of malignant neoplasm of breast: Secondary | ICD-10-CM | POA: Insufficient documentation

## 2010-11-16 DIAGNOSIS — R0602 Shortness of breath: Secondary | ICD-10-CM | POA: Insufficient documentation

## 2010-11-16 DIAGNOSIS — I10 Essential (primary) hypertension: Secondary | ICD-10-CM | POA: Insufficient documentation

## 2010-11-29 ENCOUNTER — Emergency Department (HOSPITAL_COMMUNITY)
Admission: EM | Admit: 2010-11-29 | Discharge: 2010-11-29 | Disposition: A | Discharge status: Home / Self Care | Payer: Self-pay | Attending: Emergency Medicine | Admitting: Emergency Medicine

## 2010-11-29 DIAGNOSIS — Z853 Personal history of malignant neoplasm of breast: Secondary | ICD-10-CM | POA: Insufficient documentation

## 2010-11-29 DIAGNOSIS — J45909 Unspecified asthma, uncomplicated: Secondary | ICD-10-CM | POA: Insufficient documentation

## 2010-11-29 DIAGNOSIS — R0602 Shortness of breath: Secondary | ICD-10-CM | POA: Insufficient documentation

## 2010-12-06 ENCOUNTER — Ambulatory Visit (INDEPENDENT_AMBULATORY_CARE_PROVIDER_SITE_OTHER): Payer: Self-pay | Admitting: Surgery

## 2010-12-06 ENCOUNTER — Encounter (INDEPENDENT_AMBULATORY_CARE_PROVIDER_SITE_OTHER): Payer: Self-pay | Admitting: Surgery

## 2010-12-06 VITALS — BP 168/76 | HR 84 | Temp 97.8°F

## 2010-12-06 DIAGNOSIS — L732 Hidradenitis suppurativa: Secondary | ICD-10-CM

## 2010-12-06 DIAGNOSIS — Z853 Personal history of malignant neoplasm of breast: Secondary | ICD-10-CM

## 2010-12-06 NOTE — Patient Instructions (Signed)
Follow up in three months.  Call if symptoms worsen.

## 2010-12-06 NOTE — Progress Notes (Signed)
Subjective:     Patient ID: Erica [name], female   DOB: 07-Jul-1975, 35 y.o.   MRN: [id number]  HPI The patient returns to clinic today. She is here for followup of her breast cancer and hidradenitis of her axilla. She is a little drainage from the axilla. Currently, she is having no symptoms. She denies any problems with her breast. She does have some left arm swelling.   Review of Systems  Constitutional: Negative.   HENT: Negative.   Eyes: Negative.   Respiratory: Negative.   Cardiovascular: Negative.   Gastrointestinal: Negative.        Objective:   Physical Exam  Constitutional: She is oriented to person, place, and time. She appears well-developed and well-nourished.  HENT:  Head: Normocephalic and atraumatic.  Nose: Nose normal.  Cardiovascular: Normal rate, regular rhythm and normal heart sounds.   Pulmonary/Chest: Effort normal and breath sounds normal.       Left breast smaller than right breast. Post radiation changes of  the left breast noted. No mass. Axilla  normal. Right breast shows no mass lesion. Right axilla is normal. There is a mild chronic hidradenitis in both axilla.  No drainage or redness.  Multiple sinus tracts noted.   Musculoskeletal: Normal range of motion.  Neurological: She is alert and oriented to person, place, and time.  Psychiatric: She has a normal mood and affect.       Assessment:     Bilateral hidradenitis axilla History of left breast camcer    Plan:     Return in three months.

## 2010-12-17 ENCOUNTER — Encounter (HOSPITAL_BASED_OUTPATIENT_CLINIC_OR_DEPARTMENT_OTHER): Payer: Self-pay | Admitting: Oncology

## 2010-12-17 ENCOUNTER — Telehealth: Payer: Self-pay

## 2010-12-17 DIAGNOSIS — J45909 Unspecified asthma, uncomplicated: Secondary | ICD-10-CM

## 2010-12-17 DIAGNOSIS — Z171 Estrogen receptor negative status [ER-]: Secondary | ICD-10-CM

## 2010-12-17 DIAGNOSIS — C50419 Malignant neoplasm of upper-outer quadrant of unspecified female breast: Secondary | ICD-10-CM

## 2010-12-17 DIAGNOSIS — G609 Hereditary and idiopathic neuropathy, unspecified: Secondary | ICD-10-CM

## 2010-12-17 NOTE — Telephone Encounter (Signed)
PATIENT SCHEDULED WITH DR Sherene Sires ON 12/18/10

## 2010-12-18 ENCOUNTER — Encounter: Payer: Self-pay | Admitting: Internal Medicine

## 2010-12-18 ENCOUNTER — Ambulatory Visit (INDEPENDENT_AMBULATORY_CARE_PROVIDER_SITE_OTHER): Payer: Self-pay | Admitting: Internal Medicine

## 2010-12-18 DIAGNOSIS — J45909 Unspecified asthma, uncomplicated: Secondary | ICD-10-CM

## 2010-12-18 DIAGNOSIS — I10 Essential (primary) hypertension: Secondary | ICD-10-CM

## 2010-12-18 NOTE — Assessment & Plan Note (Addendum)
DDX of  difficult airways managment all start with A and  include Adherence, Ace Inhibitors, Acid Reflux, Active Sinus Disease, Alpha 1 Antitripsin deficiency, Anxiety masquerading as Airways dz,  ABPA,  allergy(esp in young), Aspiration (esp in elderly), Adverse effects of DPI,  Active smokers, plus two Bs  = Bronchiectasis and Beta blocker use..and one C= CHF   Adherence is always the initial "prime suspect" and is a multilayered concern that requires a "trust but verify" approach in every patient - starting with knowing how to use medications, especially inhalers, correctly, keeping up with refills and understanding the fundamental difference between maintenance and prns vs those medications only taken for a very short course and then stopped and not refilled.   The proper method of use, as well as anticipated side effects, of this metered-dose inhaler are discussed and demonstrated to the patient. Improved to 75% with coaching.   ? Acid reflux > diet/ ppi qam and h2 hs reviewed   ? acei > try off  ? Adverse effect of dpi > change to dulera  See instructions for specific recommendations which were reviewed directly with the patient who was given a copy with highlighter outlining the key components.

## 2010-12-18 NOTE — Progress Notes (Signed)
  Subjective:    Patient ID: Erica [name], female    DOB: 08-Apr-1975, 35 y.o.   MRN: [id number]  HPI  77 yobf with asthma all her life much worse since around 07/2010 referred by Dr Darnelle Catalan to pulmonary clinic in Sept 2012  12/18/2010 Initial pulmonary office eval on acei and    advair  cc dtc asthma x 4 months but baseline = excess saba use "even when better" at least twice daily despite daily advair, worse at hs in fact  used saba 3 x last night.  On chemo /RT for breast ca but last rx was Jan 2012 well before asthma worse.  To er multiple times , no better with singular added x 3 months.  Denies purulent sputum but does assoc this flare with some nasal congestion and sneezing .   Also denies any obvious fluctuation of symptoms with weather or environmental changes or other aggravating or alleviating factors except as outlined above    Review of Systems  Constitutional: Negative for fever, chills and unexpected weight change.  HENT: Positive for congestion and sneezing. Negative for ear pain, nosebleeds, sore throat, rhinorrhea, trouble swallowing, dental problem, voice change, postnasal drip and sinus pressure.   Eyes: Negative for visual disturbance.  Respiratory: Positive for cough and shortness of breath. Negative for choking.   Cardiovascular: Positive for leg swelling. Negative for chest pain.  Gastrointestinal: Negative for vomiting, abdominal pain and diarrhea.  Genitourinary: Negative for difficulty urinating.  Musculoskeletal: Positive for arthralgias.  Skin: Negative for rash.  Neurological: Negative for tremors, syncope and headaches.  Hematological: Does not bruise/bleed easily.       Objective:   Physical Exam  Obese bf nad with classic voice fatigue and prominent pseudowheeze  Wt 213  12/18/10  HEENT: nl dentition, turbinates, and orophanx. Nl external ear canals without cough reflex   NECK :  without JVD/Nodes/TM/ nl carotid upstrokes bilaterally   LUNGS: no  acc muscle use, clear to A and P bilaterally without cough on insp or exp maneuvers   CV:  RRR  no s3 or murmur or increase in P2, no edema   ABD:  soft and nontender with nl excursion in the supine position. No bruits or organomegaly, bowel sounds nl  MS:  warm without deformities, calf tenderness, cyanosis or clubbing  SKIN: warm and dry without lesions    NEURO:  alert, approp, no deficits              Assessment & Plan:

## 2010-12-19 ENCOUNTER — Encounter: Payer: Self-pay | Admitting: Internal Medicine

## 2010-12-19 NOTE — Patient Instructions (Signed)
Dulera 200 Take 2 puffs first thing in am and then another 2 puffs about 12 hours later.    Work on inhaler technique:  relax and gently blow all the way out then take a nice smooth deep breath back in, triggering the inhaler at same time you start breathing in.  Hold for up to 5 seconds if you can.  Rinse and gargle with water when done   If your mouth or throat starts to bother you,   I suggest you time the inhaler to your dental care and after using the inhaler(s) brush teeth and tongue with a baking soda containing toothpaste and when you rinse this out, gargle with it first to see if this helps your mouth and throat.    Nexium 40 mg   Take 30-60 min before first meal of the day and Pepcid 20 mg one bedtime until return  Only use your albuterol as a rescue medication to be used if you can't catch your breath by resting or doing a relaxed purse lip breathing pattern. The less you use it, the better it will work when you need it.   Prednisone 10 mg take  4 each am x 2 days,   2 each am x 2 days,  1 each am x2days and stop   Please schedule a follow up office visit in 2  weeks, sooner if needed

## 2010-12-20 ENCOUNTER — Telehealth: Payer: Self-pay | Admitting: Internal Medicine

## 2010-12-20 LAB — POCT URINALYSIS DIP (DEVICE)
Glucose, UA: NEGATIVE
Ketones, ur: NEGATIVE
Operator id: 297281
Protein, ur: 30 — AB
Specific Gravity, Urine: 1.02

## 2010-12-20 NOTE — Telephone Encounter (Signed)
Verlon Au, did you try to contact this patient for anything? She was seen on the day the computers were down. Pls advise.

## 2010-12-20 NOTE — Telephone Encounter (Signed)
Must stop the lisnopril and substitute with benicar 20 (or one half a 40) until next ov (give samples)

## 2010-12-20 NOTE — Telephone Encounter (Signed)
Spoke with pt and notified of recs per MW. Pt verbalized understanding and denied any questions. She will pick up samples tomorrow and ov with MW sched for 01/03/11 at 9 am.

## 2010-12-20 NOTE — Assessment & Plan Note (Signed)
ACE inhibitors are problematic in  pts with airway complaints because  even experienced pulmonologists can't always distinguish ace effects from copd/asthma.  By themselves they don't actually cause a problem, much like oxygen can't by itself start a fire, but they certainly serve as a powerful catalyst or enhancer for any "fire"  or inflammatory process in the upper airway, be it caused by an ET  tube or more commonly reflux (especially in the obese or pts with known GERD or who are on biphoshonates).    In the era of ARB near equivalency until we have a better handle on the reversibility of the airway problem, it just makes sense to avoid ACEI  entirely in the short run and then decide later, having established a level of airway control using a reasonable limited regimen, whether to add back ace but even then being very careful to observe the pt for worsening airway control and number of meds used/ needed to control symptoms.   In this case would probably never use acei again due to the overlap of symptoms

## 2010-12-20 NOTE — Telephone Encounter (Signed)
I called and spoke with pt to verify meds- she states taking lisinopril 40 mg qd, HCTZ 25 mg qd, and clonidine 0.1 mg bid. MW, pls advise what changes you want to make and I will call her back, thanks!

## 2010-12-20 NOTE — Telephone Encounter (Signed)
LMOM for pt TCB.  Samples of Benicar 20mg  tabs left at front desk for pt to pick up.

## 2010-12-21 LAB — POCT URINALYSIS DIP (DEVICE)
Bilirubin Urine: NEGATIVE
Glucose, UA: NEGATIVE
Glucose, UA: NEGATIVE
Hgb urine dipstick: NEGATIVE
Hgb urine dipstick: NEGATIVE
Ketones, ur: NEGATIVE
Ketones, ur: >=160 — AB
Ketones, ur: NEGATIVE
Ketones, ur: NEGATIVE
Operator id: 297281
Operator id: 297281
Protein, ur: 30 — AB
Protein, ur: NEGATIVE
Specific Gravity, Urine: 1.015
Specific Gravity, Urine: 1.02
Specific Gravity, Urine: 1.025
pH: 6
pH: 7.5

## 2010-12-21 LAB — COMPREHENSIVE METABOLIC PANEL
AST: 21
BUN: 5 — ABNORMAL LOW
CO2: 25
Chloride: 102
Creatinine, Ser: 0.74
GFR calc Af Amer: >60
GFR calc non Af Amer: >60
Total Bilirubin: 1.1

## 2010-12-21 LAB — CBC
HCT: 34.1 — ABNORMAL LOW
MCV: 89
RBC: 3.83 — ABNORMAL LOW
WBC: 6.4

## 2010-12-21 LAB — CREATININE CLEARANCE, URINE, 24 HOUR: Collection Interval-CRCL: 24

## 2010-12-21 LAB — PROTEIN, URINE, 24 HOUR
Protein, 24H Urine: 49 — ABNORMAL LOW
Urine Total Volume-UPROT: 700

## 2010-12-24 LAB — CBC
HCT: 33.2 — ABNORMAL LOW
Hemoglobin: 11.5 — ABNORMAL LOW
MCHC: 34.6
MCV: 89.9
Platelets: 208
RDW: 14.2

## 2010-12-27 LAB — CBC
MCHC: 33.7
MCV: 89.2
Platelets: 229
RDW: 13.9

## 2010-12-27 LAB — URINALYSIS, ROUTINE W REFLEX MICROSCOPIC
Glucose, UA: NEGATIVE
Protein, ur: NEGATIVE
Specific Gravity, Urine: >1.03 — ABNORMAL HIGH
Urobilinogen, UA: 0.2

## 2010-12-27 LAB — WET PREP, GENITAL: Trich, Wet Prep: NONE SEEN

## 2010-12-27 LAB — GC/CHLAMYDIA PROBE AMP, GENITAL
Chlamydia, DNA Probe: NEGATIVE
GC Probe Amp, Genital: NEGATIVE

## 2010-12-31 LAB — URINALYSIS, ROUTINE W REFLEX MICROSCOPIC
Ketones, ur: NEGATIVE
Nitrite: NEGATIVE
Protein, ur: NEGATIVE
pH: 6

## 2010-12-31 LAB — CULTURE, BLOOD (ROUTINE X 2)

## 2010-12-31 LAB — BASIC METABOLIC PANEL
BUN: 10
Chloride: 107
Potassium: 4
Sodium: 136

## 2010-12-31 LAB — COMPREHENSIVE METABOLIC PANEL
CO2: 17 — ABNORMAL LOW
Calcium: 9.2
Creatinine, Ser: 1.06
GFR calc non Af Amer: 59 — ABNORMAL LOW
Glucose, Bld: 149 — ABNORMAL HIGH

## 2010-12-31 LAB — CBC
Hemoglobin: 13.2
MCHC: 33.5
MCV: 89.3
RBC: 4.43

## 2010-12-31 LAB — DIFFERENTIAL
Eosinophils Absolute: 0
Lymphocytes Relative: 3 — ABNORMAL LOW
Lymphs Abs: 0.2 — ABNORMAL LOW
Neutrophils Relative %: 95 — ABNORMAL HIGH

## 2010-12-31 LAB — TSH: TSH: 0.884

## 2011-01-02 LAB — URINE MICROSCOPIC-ADD ON

## 2011-01-02 LAB — URINALYSIS, ROUTINE W REFLEX MICROSCOPIC
Glucose, UA: NEGATIVE
Leukocytes, UA: NEGATIVE
Specific Gravity, Urine: 1.021
pH: 6

## 2011-01-03 ENCOUNTER — Ambulatory Visit (INDEPENDENT_AMBULATORY_CARE_PROVIDER_SITE_OTHER): Payer: Self-pay | Admitting: Internal Medicine

## 2011-01-03 ENCOUNTER — Encounter: Payer: Self-pay | Admitting: Internal Medicine

## 2011-01-03 DIAGNOSIS — R531 Weakness: Secondary | ICD-10-CM

## 2011-01-03 DIAGNOSIS — I10 Essential (primary) hypertension: Secondary | ICD-10-CM

## 2011-01-03 DIAGNOSIS — R5381 Other malaise: Secondary | ICD-10-CM

## 2011-01-03 DIAGNOSIS — R5383 Other fatigue: Secondary | ICD-10-CM

## 2011-01-03 DIAGNOSIS — J45909 Unspecified asthma, uncomplicated: Secondary | ICD-10-CM

## 2011-01-03 NOTE — Assessment & Plan Note (Signed)
Much better vs baseline on acei and advair, so no change rx needed for now with goal of needing saba < 2 x per week per the rule of two's

## 2011-01-03 NOTE — Patient Instructions (Addendum)
Please schedule a follow up office visit in 4 weeks, sooner if needed with pft's on return - the goal is for you to need your rescue inhaler or nebulizer less than twice weekly

## 2011-01-03 NOTE — Assessment & Plan Note (Signed)
Ok off acei

## 2011-01-03 NOTE — Progress Notes (Signed)
  Subjective:    Patient ID: Erica [name], female    DOB: [birth year], 35 y.o.   MRN: [id number]  HPI  12 yobf with asthma all her life much worse since around 07/2010 referred by Dr Darnelle Catalan to pulmonary clinic in Sept 2012  12/18/2010 Initial pulmonary office eval on acei and   advair cc dtc asthma x 4 months but baseline = excess saba use "even when better" at least twice daily despite daily advair, worse at hs in fact  used saba 3 x last night.  On chemo /RT for breast ca but last rx was Jan 2012 well before asthma worse.  To er multiple times , no better with singular added x 3 months. rec Stop Lisinopril Dulera 200 Take 2 puffs first thing in am and then another 2 puffs about 12 hours later.  Work on inhaler technique  Nexium 40 mg   Take 30-60 min before first meal of the day and Pepcid 20 mg one bedtime until return Only use your albuterol as a rescue medication to be used if you can't catch your breath by resting or doing a relaxed purse lip breathing pattern. The less you use it, the better it will work when you need it.  Prednisone 10 mg take  4 each am x 2 days,   2 each am x 2 days,  1 each am x2days and st    01/03/2011 f/u ov/Abbigael Detlefsen cc much better off ace and advair but still needing saba twice daily on avg. No  Purulent sputum.  Sleeping ok without nocturnal  or early am exacerbation  of respiratory  c/o's or need for noct saba. Also denies any obvious fluctuation of symptoms with weather or environmental changes or other aggravating or alleviating factors except as outlined above     ROS  At present neg for  any significant sore throat, dysphagia, itching, sneezing,  nasal congestion or excess/ purulent secretions,  fever, chills, sweats, unintended wt loss, pleuritic or exertional cp, hempoptysis, orthopnea pnd or leg swelling.  Also denies presyncope, palpitations, heartburn, abdominal pain, nausea, vomiting, diarrhea  or change in bowel or urinary habits, dysuria,hematuria,   rash, arthralgias, visual complaints, headache, numbness weakness or ataxia.              Objective:   Physical Exam  Obese bf nad with classic voice fatigue and prominent pseudowheeze  Wt 213  12/18/10 > 01/03/2011  214   HEENT: nl dentition, turbinates, and orophanx. Nl external ear canals without cough reflex   NECK :  without JVD/Nodes/TM/ nl carotid upstrokes bilaterally   LUNGS: no acc muscle use, clear to A and P bilaterally without cough on insp or exp maneuvers   CV:  RRR  no s3 or murmur or increase in P2, no edema   ABD:  soft and nontender with nl excursion in the supine position. No bruits or organomegaly, bowel sounds nl  MS:  warm without deformities, calf tenderness, cyanosis or clubbing              Assessment & Plan:

## 2011-01-07 LAB — URINALYSIS, ROUTINE W REFLEX MICROSCOPIC
Glucose, UA: NEGATIVE
Hgb urine dipstick: NEGATIVE
Specific Gravity, Urine: >1.03 — ABNORMAL HIGH
pH: 6

## 2011-01-09 LAB — URINALYSIS, ROUTINE W REFLEX MICROSCOPIC
Glucose, UA: NEGATIVE
Nitrite: NEGATIVE
pH: 7

## 2011-01-14 LAB — COMPREHENSIVE METABOLIC PANEL
Albumin: 3.4 — ABNORMAL LOW
Alkaline Phosphatase: 36 — ABNORMAL LOW
BUN: 4 — ABNORMAL LOW
Chloride: 105
Creatinine, Ser: 0.66
GFR calc non Af Amer: >60
Glucose, Bld: 97
Potassium: 4.3
Total Bilirubin: 0.7

## 2011-01-14 LAB — URINALYSIS, ROUTINE W REFLEX MICROSCOPIC
Bilirubin Urine: NEGATIVE
Ketones, ur: NEGATIVE
Nitrite: NEGATIVE
Specific Gravity, Urine: 1.015
Urobilinogen, UA: 0.2
pH: 6.5

## 2011-01-14 LAB — HCG, QUANTITATIVE, PREGNANCY: hCG, Beta Chain, Quant, S: 53818 — ABNORMAL HIGH

## 2011-01-14 LAB — RPR: RPR Ser Ql: NONREACTIVE

## 2011-01-14 LAB — WET PREP, GENITAL: Yeast Wet Prep HPF POC: NONE SEEN

## 2011-01-14 LAB — CBC
HCT: 38.9
Hemoglobin: 13.3
MCV: 90.9
Platelets: 213
WBC: 6.6

## 2011-01-14 LAB — ABO/RH: ABO/RH(D): A POS

## 2011-01-14 LAB — GC/CHLAMYDIA PROBE AMP, GENITAL: Chlamydia, DNA Probe: NEGATIVE

## 2011-01-15 LAB — URINALYSIS, ROUTINE W REFLEX MICROSCOPIC
Bilirubin Urine: NEGATIVE
Hgb urine dipstick: NEGATIVE
Nitrite: NEGATIVE
Protein, ur: NEGATIVE
Urobilinogen, UA: 0.2

## 2011-01-15 LAB — WET PREP, GENITAL

## 2011-01-15 LAB — HCG, QUANTITATIVE, PREGNANCY: hCG, Beta Chain, Quant, S: 766 — ABNORMAL HIGH

## 2011-01-15 LAB — CBC
MCHC: 34
MCV: 91.4
Platelets: 212
WBC: 5.7

## 2011-01-31 ENCOUNTER — Encounter: Payer: Self-pay | Admitting: Internal Medicine

## 2011-01-31 ENCOUNTER — Ambulatory Visit (INDEPENDENT_AMBULATORY_CARE_PROVIDER_SITE_OTHER): Payer: Self-pay | Admitting: Internal Medicine

## 2011-01-31 DIAGNOSIS — I10 Essential (primary) hypertension: Secondary | ICD-10-CM

## 2011-01-31 DIAGNOSIS — R5381 Other malaise: Secondary | ICD-10-CM

## 2011-01-31 DIAGNOSIS — R5383 Other fatigue: Secondary | ICD-10-CM

## 2011-01-31 DIAGNOSIS — J45909 Unspecified asthma, uncomplicated: Secondary | ICD-10-CM

## 2011-01-31 DIAGNOSIS — R531 Weakness: Secondary | ICD-10-CM

## 2011-01-31 LAB — PULMONARY FUNCTION TEST

## 2011-01-31 MED ORDER — OLMESARTAN MEDOXOMIL-HCTZ 40-25 MG PO TABS: 1.0000 | ORAL_TABLET | Freq: Every day | ORAL | Status: DC

## 2011-01-31 NOTE — Assessment & Plan Note (Signed)
Not Adequate control on present rx, reviewed > rx double dose of benicar  See instructions for specific recommendations which were reviewed directly with the patient who was given a copy with highlighter outlining the key components.

## 2011-01-31 NOTE — Patient Instructions (Addendum)
Increase the benicar to 40/25 one daily.  Work on inhaler technique:  relax and gently blow all the way out then take a nice smooth deep breath back in, triggering the inhaler at same time you start breathing in.  Hold for up to 5 seconds if you can.  Rinse and gargle with water when done   If your mouth or throat starts to bother you,   I suggest you time the inhaler to your dental care and after using the inhaler(s) brush teeth and tongue with a baking soda containing toothpaste and when you rinse this out, gargle with it first to see if this helps your mouth and throat.      If you are satisfied with your treatment plan let your doctor know and he/she can either refill your medications or you can return here when your prescription runs out.     If in any way you are not 100% satisfied,  please tell us.  If 100% better, tell your friends!   LATE ADD Pepcid 20 mg one at bedtime

## 2011-01-31 NOTE — Assessment & Plan Note (Signed)
DDX of  difficult airways managment all start with A and  include Adherence, Ace Inhibitors, Acid Reflux, Active Sinus Disease, Alpha 1 Antitripsin deficiency, Anxiety masquerading as Airways dz,  ABPA,  allergy(esp in young), Aspiration (esp in elderly), Adverse effects of DPI,  Active smokers, plus two Bs  = Bronchiectasis and Beta blocker use..and one C= CHF   Adherence is always the initial "prime suspect" and is a multilayered concern that requires a "trust but verify" approach in every patient - starting with knowing how to use medications, especially inhalers, correctly, keeping up with refills and understanding the fundamental difference between maintenance and prns vs those medications only taken for a very short course and then stopped and not refilled. The proper method of use, as well as anticipated side effects, of this metered-dose inhaler are discussed and demonstrated to the patient. Improved to 75% with coaching.   ? Acid reflux> pepcid 20 mg one at bedtime

## 2011-01-31 NOTE — Progress Notes (Signed)
  Subjective:    Patient ID: Erica [name], female    DOB: 1975-12-22, 35 y.o.   MRN: [id number]  HPI  3 yobf with asthma all her life much worse since around 07/2010 referred by Dr Erica [name] to pulmonary clinic in Sept 2012  12/18/2010 Initial pulmonary office eval on acei and   advair cc dtc asthma x 4 months but baseline = excess saba use "even when better" at least twice daily despite daily advair, worse at hs in fact  used saba 3 x last night.  On chemo /RT for breast ca but last rx was Jan 2012 well before asthma worse.  To er multiple times , no better with singular added x 3 months. rec Stop Lisinopril Dulera 200 Take 2 puffs first thing in am and then another 2 puffs about 12 hours later.  Work on inhaler technique  Nexium 40 mg   Take 30-60 min before first meal of the day and Pepcid 20 mg one bedtime until return Only use your albuterol as a rescue medication to be used if you can't catch your breath by resting or doing a relaxed purse lip breathing pattern. The less you use it, the better it will work when you need it.  Prednisone 10 mg take  4 each am x 2 days,   2 each am x 2 days,  1 each am x2days and st    01/03/2011 f/u ov/Erica [name] cc much better off ace and advair but still needing saba twice daily on avg. No  Purulent sputum. rec no change in rx/ rule of 2's reviewed     01/31/2011 f/u ov/Erica [name] cc no change sensation of sob needs albuterol w/in 1 hour of am use of dulera and also around 1am p going to bed around 10 still not back to where she was before May flare - no purulent sputum  Sleeping ok without nocturnal  or early am exacerbation  of respiratory  c/o's or need for noct saba. Also denies any obvious fluctuation of symptoms with weather or environmental changes or other aggravating or alleviating factors except as outlined above     ROS  At present neg for  any significant sore throat, dysphagia, itching, sneezing,  nasal congestion or excess/ purulent secretions,   fever, chills, sweats, unintended wt loss, pleuritic or exertional cp, hempoptysis, orthopnea pnd or leg swelling.  Also denies presyncope, palpitations, heartburn, abdominal pain, nausea, vomiting, diarrhea  or change in bowel or urinary habits, dysuria,hematuria,  rash, arthralgias, visual complaints, headache, numbness weakness or ataxia.              Objective:   Physical Exam  Obese bf nad with classic voice fatigue and prominent pseudowheeze  Wt 213  12/18/10 > 01/03/2011  214 > 01/31/2011  206  HEENT: nl dentition, turbinates, and orophanx. Nl external ear canals without cough reflex   NECK :  without JVD/Nodes/TM/ nl carotid upstrokes bilaterally   LUNGS: no acc muscle use, clear to A and P bilaterally without cough on insp or exp maneuvers   CV:  RRR  no s3 or murmur or increase in P2, no edema   ABD:  soft and nontender with nl excursion in the supine position. No bruits or organomegaly, bowel sounds nl  MS:  warm without deformities, calf tenderness, cyanosis or clubbing              Assessment & Plan:

## 2011-01-31 NOTE — Progress Notes (Signed)
PFT done today. 

## 2011-02-15 ENCOUNTER — Other Ambulatory Visit: Payer: Self-pay | Admitting: Physician Assistant

## 2011-02-15 ENCOUNTER — Telehealth: Payer: Self-pay | Admitting: *Deleted

## 2011-02-15 NOTE — Telephone Encounter (Signed)
LEFT PATIENT VOICE MAIL ABOUT THE NEW TIME BUT SAME DAY OF THE APPOINTMENT.

## 2011-02-22 ENCOUNTER — Other Ambulatory Visit: Payer: Self-pay | Admitting: Lab

## 2011-02-28 ENCOUNTER — Encounter: Payer: Self-pay | Admitting: Internal Medicine

## 2011-02-28 ENCOUNTER — Ambulatory Visit (HOSPITAL_BASED_OUTPATIENT_CLINIC_OR_DEPARTMENT_OTHER): Payer: Self-pay | Admitting: Physician Assistant

## 2011-02-28 ENCOUNTER — Telehealth: Payer: Self-pay | Admitting: Oncology

## 2011-02-28 ENCOUNTER — Encounter: Payer: Self-pay | Admitting: Physician Assistant

## 2011-02-28 ENCOUNTER — Ambulatory Visit: Payer: Self-pay | Admitting: Oncology

## 2011-02-28 ENCOUNTER — Ambulatory Visit (HOSPITAL_BASED_OUTPATIENT_CLINIC_OR_DEPARTMENT_OTHER): Payer: Self-pay

## 2011-02-28 VITALS — BP 179/95 | HR 108 | Temp 98.3°F | Ht 65.0 in | Wt 210.6 lb

## 2011-02-28 DIAGNOSIS — C50919 Malignant neoplasm of unspecified site of unspecified female breast: Secondary | ICD-10-CM

## 2011-02-28 DIAGNOSIS — R292 Abnormal reflex: Secondary | ICD-10-CM

## 2011-02-28 DIAGNOSIS — K5900 Constipation, unspecified: Secondary | ICD-10-CM

## 2011-02-28 DIAGNOSIS — C50419 Malignant neoplasm of upper-outer quadrant of unspecified female breast: Secondary | ICD-10-CM

## 2011-02-28 DIAGNOSIS — R109 Unspecified abdominal pain: Secondary | ICD-10-CM

## 2011-02-28 LAB — CBC WITH DIFFERENTIAL/PLATELET
BASO%: 0.1 % (ref 0.0–2.0)
EOS%: 0.9 % (ref 0.0–7.0)
HCT: 33.6 % — ABNORMAL LOW (ref 34.8–46.6)
LYMPH%: 11.6 % — ABNORMAL LOW (ref 14.0–49.7)
MCH: 31.4 pg (ref 25.1–34.0)
MCHC: 34.5 g/dL (ref 31.5–36.0)
MCV: 91.1 fL (ref 79.5–101.0)
NEUT%: 81.7 % — ABNORMAL HIGH (ref 38.4–76.8)
Platelets: 175 10*3/uL (ref 145–400)

## 2011-02-28 LAB — COMPREHENSIVE METABOLIC PANEL
AST: 15 U/L (ref 0–37)
BUN: 5 mg/dL — ABNORMAL LOW (ref 6–23)
Calcium: 8.7 mg/dL (ref 8.4–10.5)
Chloride: 105 mEq/L (ref 96–112)
Creatinine, Ser: 0.69 mg/dL (ref 0.50–1.10)

## 2011-02-28 NOTE — Telephone Encounter (Signed)
Gv pt appt for [id number].  scheduled pt for appt with [facility] for 03/18/2011 .  called [facility] for appt and they stated that they will fax overa referral for appt

## 2011-03-01 ENCOUNTER — Telehealth: Payer: Self-pay | Admitting: Oncology

## 2011-03-01 NOTE — Telephone Encounter (Signed)
[facility] to [facility] over required info for appt for pt

## 2011-03-01 NOTE — Progress Notes (Signed)
Hematology and Oncology Follow Up Visit  Erica [name] [id number] 04-Apr-1975 35 y.o. 02/28/11  Interim History:   The patient returns today for routine followup of her left breast carcinoma. She has multiple comorbidities including a history of hidradenitis suppourative, poorly controlled hypertension and chronic asthma. Since her appointment here in September, she has been followed by Dr. Sherene Sires and has seen some improvement in her shortness of breath.  Patient does have multiple concerns today, however. She is concerned that she is not having her menstrual cycle, and has not had a menses since approximately March 2012. (I will mention that a pregnancy test was negative in May 2012, here in our office) she is on the tummy she is having quite a bit of abdominal discomfort. She tells me her "stomach feels hard" sometimes. She has increased constipation. She denies any blood in the stool. She has also had increased reflux, and in fact was recently started on Prilosec by Dr. Sherene Sires. She has not been evaluated at this point by gastroenterologist. Postsurgical pain is still an issue, especially in the left chest wall and left axillary region. This has not changed or worsened. Of course she continues to have fatigue and shortness of breath, both of which she attributes to the asthma. She is pleased, however, that her symptoms have improved since her visit here in September.  A detailed review of systems is otherwise noncontributory as noted below.  Review of Systems: Constitutional:  no weight loss, fever, night sweats Eyes: negative NGE:[id number] Cardiovascular: positive for - dyspnea on exertion negative for - chest pain, edema or palpitations Respiratory: positive for - cough and shortness of breath negative for - hemoptysis or orthopnea Neurological: negative Dermatological: negative Gastrointestinal: positive for - abdominal pain, change in bowel habits, constipation and heartburn negative for -  nausea/vomiting or swallowing difficulty/pain Genito-Urinary: no dysuria, trouble voiding, or hematuria Hematological and Lymphatic: negative Breast: negative Musculoskeletal: negative Remaining ROS negative.  Medications: I have reviewed the patient's current medications.  Allergies:  Allergies  Allergen Reactions  . Aspirin Shortness Of Breath  . Penicillins Shortness Of Breath     Physical Exam:  Blood pressure 179/95, pulse 108, temperature 98.3 F (36.8 C), temperature source Oral, height 5\' 5"  (1.651 m), weight 210 lb 9.6 oz (95.528 kg). HEENT:  Sclerae anicteric, conjunctivae pink.  Oropharynx clear.  No mucositis or candidiasis.  Nodes:  No cervical, supraclavicular, or axillary lymphadenopathy palpated.  Breast Exam:  Right breast is benign.  No masses, discharge, skin change, or nipple inversion.  Left breast tender to palpation. No unusual masses skin changes or evidence of local recurrence.  Lungs:  Clear to auscultation bilaterally.  No crackles or rhonchi. Heart:  Regular rate and rhythm.  Abdomen:  Soft, obese, nontender.  Positive bowel sounds.  No organomegaly or masses palpated.  Musculoskeletal:  No focal spinal tenderness to palpation.  Extremities:  Benign.  No peripheral edema or cyanosis.  Skin:  Benign.  Neuro:  Nonfocal.   Lab Results: Lab Results  Component Value Date   WBC 6.4 02/28/2011   HGB 11.6 02/28/2011   HCT 33.6* 02/28/2011   MCV 91.1 02/28/2011   PLT 175 02/28/2011   NEUTROABS 5.2 02/28/2011     Chemistry      Component Value Date/Time   NA 134* 02/28/2011 1620   K 3.4* 02/28/2011 1620   CL 105 02/28/2011 1620   CO2 20 02/28/2011 1620   BUN 5* 02/28/2011 1620   CREATININE 0.69 02/28/2011 1620  Component Value Date/Time   CALCIUM 8.7 02/28/2011 1620   ALKPHOS 52 02/28/2011 1620   AST 15 02/28/2011 1620   ALT 13 02/28/2011 1620   BILITOT 0.3 02/28/2011 1620      Both a CA 27.29 and Urine pregnancy test are  pending.   Impression and Plan: 35 year-old [place] woman with a history of locally advanced left breast carcinoma, initially presenting in March 2010. Treated neoadjuvant weight with 4 cycles of docetaxel and cyclophosphamide. 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 Status post Ferrol axillary dissection, with margin clearance in August 2010. Then received 11 doses of weekly paclitaxel, completed December of 2010 and followed by radiation therapy, completed March 2011. Now followed with observation alone, no evidence of recurrence thus far. Also with comorbidities including poorly controlled hypertension and chronic asthma.  With regards to her breast cancer, the patient seems to be doing well, and we will continue to see her for routine followup every 3 months. We are sending her back to the lab today for routine labs.  She will continue to see Dr. Sherene Sires for her asthma. We are also referring her to the [facility] clinic for routine pelvic exam and Pap. We'll also refer her for a gastroenterology evaluation, in light of increased abdominal pain, reflux, and constipation.  This plan was reviewed with the patient, who voices understanding and agreement.  She knows to call with any changes or problems.    Makail Watling, PA-C 11/30/201211:04 AM

## 2011-03-13 ENCOUNTER — Encounter (INDEPENDENT_AMBULATORY_CARE_PROVIDER_SITE_OTHER): Payer: Self-pay | Admitting: Surgery

## 2011-03-13 ENCOUNTER — Ambulatory Visit (INDEPENDENT_AMBULATORY_CARE_PROVIDER_SITE_OTHER): Payer: Self-pay | Admitting: Surgery

## 2011-03-13 ENCOUNTER — Encounter: Payer: Self-pay | Admitting: Internal Medicine

## 2011-03-13 VITALS — BP 158/94 | HR 104 | Temp 96.8°F | Resp 24 | Ht 65.0 in | Wt 208.6 lb

## 2011-03-13 DIAGNOSIS — L732 Hidradenitis suppurativa: Secondary | ICD-10-CM

## 2011-03-13 DIAGNOSIS — Z853 Personal history of malignant neoplasm of breast: Secondary | ICD-10-CM

## 2011-03-13 NOTE — Patient Instructions (Signed)
Follow up in 6 months.  Call if there is drainage from armpit region or swelling that does not go down.

## 2011-03-13 NOTE — Progress Notes (Signed)
Subjective:     Patient ID: Erica [name], female   DOB: 06-Jul-1975, 35 y.o.   MRN: [id number]  HPI The patient returns to clinic today. She is here for followup of her breast cancer and hidradenitis of her axilla. She has  a little drainage from the axilla. Currently, she is having no symptoms. She denies any problems with her breast. She does have some left arm swelling.   Review of Systems  Constitutional: Negative.   HENT: Negative.   Eyes: Negative.   Respiratory: Negative.   Cardiovascular: Negative.   Gastrointestinal: Negative.        Objective:   Physical Exam  Constitutional: She is oriented to person, place, and time. She appears well-developed and well-nourished.  HENT:  Head: Normocephalic and atraumatic.  Nose: Nose normal.  Cardiovascular: Normal rate, regular rhythm and normal heart sounds.   Pulmonary/Chest: Effort normal and breath sounds normal.       Left breast smaller than right breast. Post radiation changes of  the left breast noted. No mass. Axilla  normal. Right breast shows no mass lesion. Right axilla is normal. There is a mild chronic hidradenitis in both axilla.  No drainage or redness.  Multiple sinus tracts noted.   Musculoskeletal: Normal range of motion.  Neurological: She is alert and oriented to person, place, and time.  Psychiatric: She has a normal mood and affect.       Assessment:     Bilateral hidradenitis axilla History of left breast camcer    Plan:     Return in six months.

## 2011-03-18 ENCOUNTER — Ambulatory Visit (INDEPENDENT_AMBULATORY_CARE_PROVIDER_SITE_OTHER): Payer: Self-pay | Admitting: Internal Medicine

## 2011-03-18 ENCOUNTER — Telehealth: Payer: Self-pay | Admitting: Internal Medicine

## 2011-03-18 ENCOUNTER — Encounter: Payer: Self-pay | Admitting: Internal Medicine

## 2011-03-18 DIAGNOSIS — R109 Unspecified abdominal pain: Secondary | ICD-10-CM

## 2011-03-18 DIAGNOSIS — K219 Gastro-esophageal reflux disease without esophagitis: Secondary | ICD-10-CM

## 2011-03-18 DIAGNOSIS — R140 Abdominal distension (gaseous): Secondary | ICD-10-CM | POA: Insufficient documentation

## 2011-03-18 DIAGNOSIS — R141 Gas pain: Secondary | ICD-10-CM

## 2011-03-18 MED ORDER — MOMETASONE FURO-FORMOTEROL FUM 200-5 MCG/ACT IN AERO: 2.0000 | INHALATION_SPRAY | Freq: Two times a day (BID) | RESPIRATORY_TRACT | Status: DC

## 2011-03-18 MED ORDER — DOCUSATE SODIUM 100 MG PO CAPS: 100.0000 mg | ORAL_CAPSULE | Freq: Every day | ORAL | Status: DC | PRN

## 2011-03-18 MED ORDER — POLYETHYLENE GLYCOL 3350 17 G PO PACK: 17.0000 g | PACK | Freq: Every day | ORAL | Status: AC

## 2011-03-18 MED ORDER — ESOMEPRAZOLE MAGNESIUM 40 MG PO CPDR: 40.0000 mg | DELAYED_RELEASE_CAPSULE | Freq: Every day | ORAL | Status: DC

## 2011-03-18 NOTE — Telephone Encounter (Signed)
Pt came into office and requested dulera samples but we are out so pt request rx [facility] on [address]. Rx sent. Carron Curie, CMA

## 2011-03-18 NOTE — Patient Instructions (Addendum)
You have been scheduled for a CT scan of the abdomen and pelvis at [facility] ([address]---this is in the same building as [facility]).   You are scheduled on 03/21/2011 at 2:30pm. You should arrive 15 minutes prior to your appointment time for registration. Please follow the written instructions below on the day of your exam:  WARNING: IF YOU ARE ALLERGIC TO IODINE/X-RAY DYE, PLEASE NOTIFY RADIOLOGY IMMEDIATELY AT [phone]! YOU WILL BE GIVEN A 13 HOUR PREMEDICATION PREP.  1) Do not eat or drink anything after 10:30am (4 hours prior to your test) 2) You have been given 2 bottles of oral contrast to drink. The solution may tast better if refrigerated, but do NOT add ice or any other liquid to this solution. Shake well before drinking.    Drink 1 bottle of contrast @ 12:30 (2 hours prior to your exam)  Drink 1 bottle of contrast @1 :301 hour prior to your exam)  You may take any medications as prescribed with a small amount of water except for the following: Metformin, Glucophage, Glucovance, Avandamet, Riomet, Fortamet, Actoplus Met, Janumet, Glumetza or Metaglip. The above medications must be held the day of the exam AND 48 hours after the exam.  The purpose of you drinking the oral contrast is to aid in the visualization of your intestinal tract. The contrast solution may cause some diarrhea. Before your exam is started, you will be given a small amount of fluid to drink. Depending on your individual set of symptoms, you may also receive an intravenous injection of x-ray contrast/dye. Plan on being at [facility] for 30 minutes or long, depending on the type of exam you are having performed.  If you have any questions regarding your exam or if you need to reschedule, you may call the CT department at [phone] between the hours of 8:00 am and 5:00 pm, Monday-Friday.   You have been given samples of Nexium and Miralax, please take as directed.   We have  sent the following medications to your pharmacy for you to pick up at your convenience: colace  We have referred you to Dr Oliver Barre your app tis on 04/17/2011 3:00pm, please arrive at 2:45pm make sure to bring a list of your medications. If you can not make this appt please give 24 hour notice to avoid the $50 fee.  ________________________________________________________________________

## 2011-03-18 NOTE — Progress Notes (Signed)
Subjective:    Patient ID: Erica [name], female    DOB: 02-16-1976, 35 y.o.   MRN: [id number]  HPI Erica [name] is a 35 year old female with a past medical history of breast cancer status post lumpectomy and chemotherapy/XRT, asthma, hypertension and GERD who is seen in consultation at the request of her primary care provider for evaluation of epigastric pain and reflux. The patient reports over the last 3 to him or months having increased acid reflux. She states that she has a burning in her epigastric area which radiates up into her chest and the back of her throat. She reports this as a burning pain and she also notes burning in her throat. Occasionally this causes her to have a sensation of "gagging". She also notes bloating in her abdomen after eating and occasionally feeling like "there is a knot in my stomach" after eating. She specifically denies nausea and vomiting. No dysphagia or odynophagia. She does report a feeling of fullness at times even before eating. She reports her bowel movements have been a little less frequent than normal for her though she is not sure she feels constipated. She normally has 2 formed stools a day and now she is having one stool daily or every other day. She still reports brown stool without bright red blood per rectum or melena. She reports her appetite is okay, and she has noted some slight weight gain. She also has had more issues with her asthma over the last 3-4 months. She has been using omeprazole 20 mg 30 minutes before bed, and she is taking Pepcid occasionally in the morning. No fevers or chills.  Today she does report the sensation of racing heartbeat with a mild increase in her baseline dyspnea. She does report some dyspnea at baseline which she attributes to her asthma which usually responds to her inhalers. She denies chest pain. No dizziness or lightheadedness. No cough  Review of Systems Constitutional: see HPI HEENT: Negative for sore throat, mouth  sores and trouble swallowing. Eyes: Negative for visual disturbance Respiratory: See HPI Cardiovascular: see HPI, no new LE edema Gastrointestinal: See history of present illness Genitourinary: Negative for dysuria and hematuria. Musculoskeletal: Negative for back pain, arthralgias and myalgias Skin: Negative for rash or color change Neurological: Negative for weakness, numbness, positive for headache Hematological: Negative for adenopathy, negative for easy bruising/bleeding Psychiatric/behavioral: Negative for depressed mood, negative for anxiety   Past Medical History  Diagnosis Date  . Breast cancer 2010  . Asthma   . Hypertension   . GERD (gastroesophageal reflux disease)    Past Surgical History  Procedure Date  . Hand surgery   . Breast lumpectomy 09/2008  . Carpal tunnel release     left hand   Current Outpatient Prescriptions  Medication Sig Dispense Refill  . albuterol (PROVENTIL) (2.5 MG/3ML) 0.083% nebulizer solution Take 2.5 mg by nebulization every 4 (four) hours as needed.        . diltiazem (CARDIZEM) 120 MG tablet Take 120 mg by mouth daily.        . famotidine (PEPCID) 20 MG tablet Take 20 mg by mouth at bedtime.        . Fluticasone-Salmeterol (ADVAIR) 250-50 MCG/DOSE AEPB Inhale 1 puff into the lungs every 12 (twelve) hours.        . montelukast (SINGULAIR) 10 MG tablet Take 10 mg by mouth at bedtime.       Marland Kitchen olmesartan-hydrochlorothiazide (BENICAR HCT) 40-25 MG per tablet Take 1 tablet by mouth  daily.      . omeprazole (PRILOSEC) 20 MG capsule Take 20 mg by mouth daily.       Marland Kitchen docusate sodium (COLACE) 100 MG capsule Take 1 capsule (100 mg total) by mouth daily as needed for constipation.  30 capsule  1  . esomeprazole (NEXIUM) 40 MG capsule Take 1 capsule (40 mg total) by mouth daily.  30 capsule  1  . Mometasone Furo-Formoterol Fum (DULERA) 200-5 MCG/ACT AERO Inhale 2 puffs into the lungs 2 (two) times daily.  1 Inhaler  6  . polyethylene glycol (MIRALAX)  packet Take 17 g by mouth daily.  14 each  0   Allergies  Allergen Reactions  . Aspirin Shortness Of Breath  . Penicillins Shortness Of Breath   Family History  Problem Relation Age of Onset  . Diabetes Maternal Grandmother   . Heart disease Maternal Grandmother     Social History  . Marital Status: Single    Spouse Name: N/A    Number of Children: 3   Occupational Histoy  .     Social History Main Topics  . Smoking status: Never Smoker   . Smokeless tobacco: Never Used  . Alcohol Use: 1.2 oz/week    2 Cans of beer per week  . Drug Use: No  . Sexually Active: Yes      Objective:   Physical Exam BP 140/80  Pulse 156  Ht 5\' 5"  (1.651 m)  Wt 208 lb 3.2 oz (94.439 kg)  BMI 34.65 kg/m2 Constitutional: Well-developed and well-nourished. No distress. HEENT: Normocephalic and atraumatic. Oropharynx is clear and moist. No oropharyngeal exudate. Conjunctivae are normal. Pupils are equal round and reactive to light. No scleral icterus. Neck: Neck supple. Trachea midline. Cardiovascular: Tachycardic, regular, no murmur rub or gallop  Pulmonary/chest: Slight tachypnea with normal work of breathing no wheezing rales or rhonchi. Abdominal: Soft, nontender, mildly distended with mild increase in tympany, Bowel sounds active throughout. There are no masses palpable. No hepatosplenomegaly. Extremities: no clubbing, cyanosis, or edema Lymphadenopathy: No cervical adenopathy noted. Neurological: Alert and oriented to person place and time. Skin: Skin is warm and dry. No rashes noted. Psychiatric: Normal mood and affect. Behavior is normal.  CBC    Component Value Date/Time   WBC 6.4 02/28/2011 1620   WBC 4.9 12/28/2009 1347   RBC 3.68* 02/28/2011 1620   RBC 4.79 12/28/2009 1347   HGB 11.6 02/28/2011 1620   HGB 15.5* 12/28/2009 1347   HCT 33.6* 02/28/2011 1620   HCT 43.4 12/28/2009 1347   PLT 175 02/28/2011 1620   PLT 231 12/28/2009 1347   MCV 91.1 02/28/2011 1620   MCV 90.6  12/28/2009 1347   MCH 31.4 02/28/2011 1620   MCH 32.4 12/28/2009 1347   MCHC 34.5 02/28/2011 1620   MCHC 35.7 12/28/2009 1347   RDW 13.6 02/28/2011 1620   RDW 12.2 12/28/2009 1347   LYMPHSABS 0.7* 02/28/2011 1620   LYMPHSABS 0.1* 07/24/2009 0436   MONOABS 0.4 02/28/2011 1620   MONOABS 0.0* 07/24/2009 0436   EOSABS 0.1 02/28/2011 1620   EOSABS 0.0 07/24/2009 0436   BASOSABS 0.0 02/28/2011 1620   BASOSABS 0.0 07/24/2009 0436    CMP     Component Value Date/Time   NA 134* 02/28/2011 1620   K 3.4* 02/28/2011 1620   CL 105 02/28/2011 1620   CO2 20 02/28/2011 1620   GLUCOSE 110* 02/28/2011 1620   BUN 5* 02/28/2011 1620   CREATININE 0.69 02/28/2011 1620   CALCIUM 8.7  02/28/2011 1620   PROT 6.7 02/28/2011 1620   ALBUMIN 2.9* 02/28/2011 1620   AST 15 02/28/2011 1620   ALT 13 02/28/2011 1620   ALKPHOS 52 02/28/2011 1620   BILITOT 0.3 02/28/2011 1620   GFRNONAA >60 12/28/2009 1347   GFRAA  Value: >60        The eGFR has been calculated using the MDRD equation. This calculation has not been validated in all clinical situations. eGFR's persistently <60 mL/min signify possible Chronic Kidney Disease. 12/28/2009 1347       Assessment & Plan:   35 year old female with a past medical history of breast cancer status post lumpectomy and chemotherapy/XRT, asthma, hypertension and GERD who is seen in consultation at the request of her primary care provider for evaluation of epigastric pain and reflux.  1. GERD/abd pain/bloating -- certainly acid peptic disease/reflux could be contributing to some of her symptoms. She is currently on a low dose PPI, which is not taking any ideal manner. I will increase her to a more potent PPI, namely Nexium 40 mg daily. I've asked that she take this 30 minutes to one hour before breakfast. I will also order CT scan abdomen and pelvis to further evaluate her abdominal symptoms/pain. If this is negative, we will consider proceeding with upper endoscopy. I recommended she  take Colace 100 mg once to twice daily to help soften her stools. If she continues to be irregular, she can add MiraLAX 17 g daily. Also will give her an anti-bloating diet. I'll see her back after her CT scan to gauge response of the medications.  2. Tachycardia/dyspnea -- EKG was performed in the office today which showed sinus rhythm at a rate of 116. There was no ST segment changes. I've asked that should her palpitations continue, or her breathing become more problematic for her that she seek care either with her primary care or in the emergency department. She will contact her pulmonologist as well.  3. Primary care -- the patient expressed a desire to transfer to a [facility] primary care doctor.  She will be referred.

## 2011-03-21 ENCOUNTER — Other Ambulatory Visit: Payer: Self-pay | Admitting: Internal Medicine

## 2011-03-21 ENCOUNTER — Telehealth: Payer: Self-pay | Admitting: *Deleted

## 2011-03-21 ENCOUNTER — Ambulatory Visit (INDEPENDENT_AMBULATORY_CARE_PROVIDER_SITE_OTHER)
Admission: RE | Admit: 2011-03-21 | Discharge: 2011-03-21 | Disposition: A | Discharge status: Home / Self Care | Payer: Self-pay | Source: Ambulatory Visit | Attending: Internal Medicine | Admitting: Internal Medicine

## 2011-03-21 DIAGNOSIS — R109 Unspecified abdominal pain: Secondary | ICD-10-CM

## 2011-03-21 NOTE — Telephone Encounter (Signed)
[name]'s office called asking for results of 02-28-11 pregnancy test.  No results found in results view except the May 2012 HCG is negative.  Called lab to investigate further and no specimen received to perform this test.  Erica [name] reports pt.was sent for CT scan today and is pregnant.  Will notify providers pt. is pregnant.

## 2011-03-22 ENCOUNTER — Other Ambulatory Visit: Payer: Self-pay | Admitting: *Deleted

## 2011-03-22 DIAGNOSIS — R1084 Generalized abdominal pain: Secondary | ICD-10-CM

## 2011-03-22 DIAGNOSIS — Z331 Pregnant state, incidental: Secondary | ICD-10-CM

## 2011-03-25 ENCOUNTER — Encounter: Payer: Self-pay | Admitting: Internal Medicine

## 2011-03-27 ENCOUNTER — Ambulatory Visit (HOSPITAL_COMMUNITY): Admission: RE | Admit: 2011-03-27 | Payer: Self-pay | Source: Ambulatory Visit

## 2011-04-02 NOTE — L&D Delivery Note (Cosign Needed)
Delivery Note At 3:38 AM a viable female was delivered via Vaginal, Spontaneous Delivery (Presentation: ; Direct Occiput Anterior).  APGAR: 8, 9; weight 6 lb 12.5 oz (3075 g).   Placenta status: Intact, Spontaneous.  Cord: 3 vessels with the following complications: None.  NICU team in attendance of birth d/t 3rd trimester u/s dating, Mag, h/o CHTN & asthma on Cat C&D meds in 1st trimester.    Anesthesia: Epidural  Episiotomy: n/a Lacerations: hemostatic 1st degree supraurethral, no repair Suture Repair: n/a Est. Blood Loss (mL):  Mom to AICU.  Baby to nursery-stable. Placenta to path. Desires pp BTL- to leave in epidural cath Planning to bottlefeed, wants circ (undecided in vs. Out of hospital)  Cathie Beams, CNM in attendance of birth.  Joellyn Haff, SNM 05/10/2011, 4:01 AM

## 2011-04-09 ENCOUNTER — Telehealth: Payer: Self-pay | Admitting: *Deleted

## 2011-04-09 NOTE — Telephone Encounter (Signed)
Message copied by Florene Glen on Tue Apr 09, 2011  2:14 PM ------      Message from: Beverley Fiedler      Created: Thu Mar 28, 2011  2:00 PM       It looks like she no-showed her OB appt for [place]. Could you make contact and make sure she is planning on seeing the OB in outpt visit?  Thanks.

## 2011-04-09 NOTE — Telephone Encounter (Signed)
Lm for pt to call back with a young female. EPIC shows pt has r/s everything she cancelled earlier; at [facility], with Dr Oliver Barre, and with a new OB/GYN md.

## 2011-04-10 ENCOUNTER — Telehealth: Payer: Self-pay | Admitting: *Deleted

## 2011-04-10 ENCOUNTER — Ambulatory Visit (INDEPENDENT_AMBULATORY_CARE_PROVIDER_SITE_OTHER): Payer: Self-pay | Admitting: Advanced Practice Midwife

## 2011-04-10 VITALS — BP 163/106 | Temp 97.9°F | Wt 207.5 lb

## 2011-04-10 DIAGNOSIS — O0930 Supervision of pregnancy with insufficient antenatal care, unspecified trimester: Secondary | ICD-10-CM

## 2011-04-10 DIAGNOSIS — R141 Gas pain: Secondary | ICD-10-CM

## 2011-04-10 DIAGNOSIS — O09219 Supervision of pregnancy with history of pre-term labor, unspecified trimester: Secondary | ICD-10-CM

## 2011-04-10 DIAGNOSIS — O169 Unspecified maternal hypertension, unspecified trimester: Secondary | ICD-10-CM

## 2011-04-10 DIAGNOSIS — R140 Abdominal distension (gaseous): Secondary | ICD-10-CM

## 2011-04-10 DIAGNOSIS — O10019 Pre-existing essential hypertension complicating pregnancy, unspecified trimester: Secondary | ICD-10-CM

## 2011-04-10 DIAGNOSIS — I10 Essential (primary) hypertension: Secondary | ICD-10-CM

## 2011-04-10 DIAGNOSIS — C50919 Malignant neoplasm of unspecified site of unspecified female breast: Secondary | ICD-10-CM

## 2011-04-10 DIAGNOSIS — O358XX0 Maternal care for other (suspected) fetal abnormality and damage, not applicable or unspecified: Secondary | ICD-10-CM

## 2011-04-10 LAB — POCT URINALYSIS DIP (DEVICE)
Ketones, ur: NEGATIVE mg/dL
Leukocytes, UA: NEGATIVE
Protein, ur: 30 mg/dL — AB
Specific Gravity, Urine: >=1.03 (ref 1.005–1.030)
pH: 7 (ref 5.0–8.0)

## 2011-04-10 MED ORDER — AMLODIPINE BESYLATE 10 MG PO TABS: 10.0000 mg | ORAL_TABLET | Freq: Every day | ORAL | Status: DC

## 2011-04-10 NOTE — Telephone Encounter (Signed)
Called pt and informed her to stop taking cardizem and begin norvasc. Pt has already taken her dose of cardizem today. She will begin the norvasc tomorrow. Pt voiced understanding.

## 2011-04-10 NOTE — Progress Notes (Signed)
Subjective:    Erica [name] is a [id number] Unknown being seen today for her first obstetrical visit.  Her obstetrical history is significant for advanced maternal age, obesity and Undergoing Cancer Treatment for Breast CA, Exposure to Chemo and CT scan, Chronic HTN. Patient not asked intend to breast feed. Pregnancy history fully reviewed.  Patient reports fatigue, no contractions and no cramping.  Filed Vitals:   04/10/11 0905 04/10/11 0924  BP: 160/103 163/106  Temp: 97.9 F (36.6 C)   Weight: 207 lb 8 oz (94.121 kg)     HISTORY: OB History    Grav Para Term Preterm Abortions TAB SAB Ect Mult Living   5 3 3  1  1   3      # Outc Date GA Lbr Len/2nd Wgt Sex Del Anes PTL Lv   1 TRM 9/95 [redacted]w[redacted]d   F SVD None  Yes   2 TRM 5/98    F SVD None  Yes   3 TRM 3/09    F SVD EPI  Yes   4 SAB 1/10           5 CUR              Past Medical History  Diagnosis Date  . Breast cancer 2010  . Asthma   . Hypertension   . GERD (gastroesophageal reflux disease)    Past Surgical History  Procedure Date  . Hand surgery   . Breast lumpectomy 09/2008  . Carpal tunnel release     left hand   Family History  Problem Relation Age of Onset  . Diabetes Maternal Grandmother   . Heart disease Maternal Grandmother      Exam    Uterine Size: 35 cm  Pelvic Exam:    Perineum: No Hemorrhoids   Vulva: normal   Vagina:  normal mucosa, normal discharge   pH: n/a   Cervix: retroverted   Adnexa: normal adnexa   Bony Pelvis: average  System: Breast:  Grossly normal, exam deferred, see Oncology clinic notes   Skin: normal coloration and turgor, no rashes    Neurologic: oriented   Extremities: normal strength, tone, and muscle mass   HEENT n/a   Mouth/Teeth mucous membranes moist, pharynx normal without lesions   Neck supple   Cardiovascular: regular rate and rhythm   Respiratory:  appears well, vitals normal, no respiratory distress, acyanotic, normal RR, ear and throat exam is normal, neck  free of mass or lymphadenopathy, chest clear, no wheezing, crepitations, rhonchi, normal symmetric air entry   Abdomen: soft, non-tender; bowel sounds normal; no masses,  no organomegaly   Urinary: urethral meatus normal      Assessment:    Pregnancy: [id number] Patient Active Problem List  Diagnoses  . Breast cancer  . Asthma  . Neuropathy due to drug  . Hypertension  . GERD (gastroesophageal reflux disease)  . Bloating        Plan:     Initial labs drawn. Prenatal vitamins. Problem list reviewed and updated. Genetic Screening discussed First Screen: too late.  Ultrasound discussed; fetal survey: requested.  Follow up in 1 weeks in [facility]  [place] requested to be done in MFM along with MDM consultation 24 hr urine ordered and CMET added to OB labs Consult Dr Debroah Loop re: HTN.  Will stop Cardizem and start Norvasc 10 mg QD  50% of 30 min visit spent on counseling and coordination of care.     [facility] 04/10/2011

## 2011-04-10 NOTE — Progress Notes (Signed)
Has not had an ultrasound, but had an MRI that showed she was pregnant in December.  Estimated at that time early third trimester. Hasn't been having periods since she stopped chemotherapy. Has already had her flu shot. 1 hr gtt due at 1011. Pt has taken numerous pregnancy tests since may and says that they have all turned out negative.

## 2011-04-10 NOTE — Progress Notes (Signed)
Addended by: Sherre Lain A on: 04/10/2011 02:46 PM   Modules accepted: Orders

## 2011-04-10 NOTE — Telephone Encounter (Signed)
Spoke with pt this am and she stated she was in [place]. When the other tests were ordered. She went to [facility] today, will have an U/S this week and f/u again at [facility] later in the week. Pt states they think she may be full term. Dr Darnelle Catalan is aware of the situation.

## 2011-04-10 NOTE — Telephone Encounter (Signed)
Pt left msg for me around 4:00pm yesterday; tried to call her today and mailbox is full.

## 2011-04-10 NOTE — Patient Instructions (Signed)
Pregnancy - Third Trimester The third trimester of pregnancy (the last 3 months) is a period of the most rapid growth for you and your baby. The baby approaches a length of 20 inches and a weight of 6 to 10 pounds. The baby is adding on fat and getting ready for life outside your body. While inside, babies have periods of sleeping and waking, suck their thumbs, and hiccups. You can often feel small contractions of the uterus. This is false labor. It is also called Braxton-Hicks contractions. This is like a practice for labor. The usual problems in this stage of pregnancy include more difficulty breathing, swelling of the hands and feet from water retention, and having to urinate more often because of the uterus and baby pressing on your bladder.  PRENATAL EXAMS  Blood work may continue to be done during prenatal exams. These tests are done to check on your health and the probable health of your baby. Blood work is used to follow your blood levels (hemoglobin). Anemia (low hemoglobin) is common during pregnancy. Iron and vitamins are given to help prevent this. You may also continue to be checked for diabetes. Some of the past blood tests may be done again.   The size of the uterus is measured during each visit. This makes sure your baby is growing properly according to your pregnancy dates.   Your blood pressure is checked every prenatal visit. This is to make sure you are not getting toxemia.   Your urine is checked every prenatal visit for infection, diabetes and protein.   Your weight is checked at each visit. This is done to make sure gains are happening at the suggested rate and that you and your baby are growing normally.   Sometimes, an ultrasound is performed to confirm the position and the proper growth and development of the baby. This is a test done that bounces harmless sound waves off the baby so your caregiver can more accurately determine due dates.   Discuss the type of pain  medication and anesthesia you will have during your labor and delivery.   Discuss the possibility and anesthesia if a Cesarean Section might be necessary.   Inform your caregiver if there is any mental or physical violence at home.  Sometimes, a specialized non-stress test, contraction stress test and biophysical profile are done to make sure the baby is not having a problem. Checking the amniotic fluid surrounding the baby is called an amniocentesis. The amniotic fluid is removed by sticking a needle into the belly (abdomen). This is sometimes done near the end of pregnancy if an early delivery is required. In this case, it is done to help make sure the baby's lungs are mature enough for the baby to live outside of the womb. If the lungs are not mature and it is unsafe to deliver the baby, an injection of cortisone medication is given to the mother 1 to 2 days before the delivery. This helps the baby's lungs mature and makes it safer to deliver the baby. CHANGES OCCURING IN THE THIRD TRIMESTER OF PREGNANCY Your body goes through many changes during pregnancy. They vary from person to person. Talk to your caregiver about changes you notice and are concerned about.  During the last trimester, you have probably had an increase in your appetite. It is normal to have cravings for certain foods. This varies from person to person and pregnancy to pregnancy.   You may begin to get stretch marks on your hips,   abdomen, and breasts. These are normal changes in the body during pregnancy. There are no exercises or medications to take which prevent this change.   Constipation may be treated with a stool softener or adding bulk to your diet. Drinking lots of fluids, fiber in vegetables, fruits, and whole grains are helpful.   Exercising is also helpful. If you have been very active up until your pregnancy, most of these activities can be continued during your pregnancy. If you have been less active, it is helpful  to start an exercise program such as walking. Consult your caregiver before starting exercise programs.   Avoid all smoking, alcohol, un-prescribed drugs, herbs and "street drugs" during your pregnancy. These chemicals affect the formation and growth of the baby. Avoid chemicals throughout the pregnancy to ensure the delivery of a healthy infant.   Backache, varicose veins and hemorrhoids may develop or get worse.   You will tire more easily in the third trimester, which is normal.   The baby's movements may be stronger and more often.   You may become short of breath easily.   Your belly button may stick out.   A yellow discharge may leak from your breasts called colostrum.   You may have a bloody mucus discharge. This usually occurs a few days to a week before labor begins.  HOME CARE INSTRUCTIONS   Keep your caregiver's appointments. Follow your caregiver's instructions regarding medication use, exercise, and diet.   During pregnancy, you are providing food for you and your baby. Continue to eat regular, well-balanced meals. Choose foods such as meat, fish, milk and other low fat dairy products, vegetables, fruits, and whole-grain breads and cereals. Your caregiver will tell you of the ideal weight gain.   A physical sexual relationship may be continued throughout pregnancy if there are no other problems such as early (premature) leaking of amniotic fluid from the membranes, vaginal bleeding, or belly (abdominal) pain.   Exercise regularly if there are no restrictions. Check with your caregiver if you are unsure of the safety of your exercises. Greater weight gain will occur in the last 2 trimesters of pregnancy. Exercising helps:   Control your weight.   Get you in shape for labor and delivery.   You lose weight after you deliver.   Rest a lot with legs elevated, or as needed for leg cramps or low back pain.   Wear a good support or jogging bra for breast tenderness during  pregnancy. This may help if worn during sleep. Pads or tissues may be used in the bra if you are leaking colostrum.   Do not use hot tubs, steam rooms, or saunas.   Wear your seat belt when driving. This protects you and your baby if you are in an accident.   Avoid raw meat, cat litter boxes and soil used by cats. These carry germs that can cause birth defects in the baby.   It is easier to loose urine during pregnancy. Tightening up and strengthening the pelvic muscles will help with this problem. You can practice stopping your urination while you are going to the bathroom. These are the same muscles you need to strengthen. It is also the muscles you would use if you were trying to stop from passing gas. You can practice tightening these muscles up 10 times a set and repeating this about 3 times per day. Once you know what muscles to tighten up, do not perform these exercises during urination. It is more likely   to cause an infection by backing up the urine.   Ask for help if you have financial, counseling or nutritional needs during pregnancy. Your caregiver will be able to offer counseling for these needs as well as refer you for other special needs.   Make a list of emergency phone numbers and have them available.   Plan on getting help from family or friends when you go home from the hospital.   Make a trial run to the hospital.   Take prenatal classes with the father to understand, practice and ask questions about the labor and delivery.   Prepare the baby's room/nursery.   Do not travel out of the city unless it is absolutely necessary and with the advice of your caregiver.   Wear only low or no heal shoes to have better balance and prevent falling.  MEDICATIONS AND DRUG USE IN PREGNANCY  Take prenatal vitamins as directed. The vitamin should contain 1 milligram of folic acid. Keep all vitamins out of reach of children. Only a couple vitamins or tablets containing iron may be fatal  to a baby or young child when ingested.   Avoid use of all medications, including herbs, over-the-counter medications, not prescribed or suggested by your caregiver. Only take over-the-counter or prescription medicines for pain, discomfort, or fever as directed by your caregiver. Do not use aspirin, ibuprofen (Motrin, Advil, Nuprin) or naproxen (Aleve) unless OK'd by your caregiver.   Let your caregiver also know about herbs you may be using.   Alcohol is related to a number of birth defects. This includes fetal alcohol syndrome. All alcohol, in any form, should be avoided completely. Smoking will cause low birth rate and premature babies.   Street/illegal drugs are very harmful to the baby. They are absolutely forbidden. A baby born to an addicted mother will be addicted at birth. The baby will go through the same withdrawal an adult does.  SEEK MEDICAL CARE IF: You have any concerns or worries during your pregnancy. It is better to call with your questions if you feel they cannot wait, rather than worry about them. DECISIONS ABOUT CIRCUMCISION You may or may not know the sex of your baby. If you know your baby is a boy, it may be time to think about circumcision. Circumcision is the removal of the foreskin of the penis. This is the skin that covers the sensitive end of the penis. There is no proven medical need for this. Often this decision is made on what is popular at the time or based upon religious beliefs and social issues. You can discuss these issues with your caregiver or pediatrician. SEEK IMMEDIATE MEDICAL CARE IF:   An unexplained oral temperature above 102 F (38.9 C) develops, or as your caregiver suggests.   You have leaking of fluid from the vagina (birth canal). If leaking membranes are suspected, take your temperature and tell your caregiver of this when you call.   There is vaginal spotting, bleeding or passing clots. Tell your caregiver of the amount and how many pads are  used.   You develop a bad smelling vaginal discharge with a change in the color from clear to white.   You develop vomiting that lasts more than 24 hours.   You develop chills or fever.   You develop shortness of breath.   You develop burning on urination.   You loose more than 2 pounds of weight or gain more than 2 pounds of weight or as suggested by your   caregiver.   You notice sudden swelling of your face, hands, and feet or legs.   You develop belly (abdominal) pain. Round ligament discomfort is a common non-cancerous (benign) cause of abdominal pain in pregnancy. Your caregiver still must evaluate you.   You develop a severe headache that does not go away.   You develop visual problems, blurred or double vision.   If you have not felt your baby move for more than 1 hour. If you think the baby is not moving as much as usual, eat something with sugar in it and lie down on your left side for an hour. The baby should move at least 4 to 5 times per hour. Call right away if your baby moves less than that.   You fall, are in a car accident or any kind of trauma.   There is mental or physical violence at home.  Document Released: 03/12/2001 Document Revised: 11/28/2010 Document Reviewed: 09/14/2008 [employer] Patient Information 2012 [employer]. 

## 2011-04-10 NOTE — Telephone Encounter (Signed)
Message copied by Jill Side on Wed Apr 10, 2011 11:47 AM ------      Message from: [place], [place] L      Created: Wed Apr 10, 2011 10:49 AM       Please call patient and ask her to stop her Dilitiazem/Cardizem and I put in a Rx for new BP drug, Norvasc once a Naisha Wisdom.

## 2011-04-11 LAB — OBSTETRIC PANEL
Antibody Screen: NEGATIVE
Basophils Relative: 0 % (ref 0–1)
HCT: 34.3 % — ABNORMAL LOW (ref 36.0–46.0)
Hemoglobin: 11.8 g/dL — ABNORMAL LOW (ref 12.0–15.0)
Lymphocytes Relative: 16 % (ref 12–46)
Lymphs Abs: 1 10*3/uL (ref 0.7–4.0)
MCHC: 34.4 g/dL (ref 30.0–36.0)
Monocytes Absolute: 0.4 10*3/uL (ref 0.1–1.0)
Monocytes Relative: 6 % (ref 3–12)
Neutro Abs: 4.5 10*3/uL (ref 1.7–7.7)
Rh Type: POSITIVE
Rubella: 60.3 IU/mL — ABNORMAL HIGH

## 2011-04-11 LAB — COMPREHENSIVE METABOLIC PANEL
BUN: 7 mg/dL (ref 6–23)
CO2: 21 mEq/L (ref 19–32)
Calcium: 8.6 mg/dL (ref 8.4–10.5)
Creat: 0.64 mg/dL (ref 0.50–1.10)
Glucose, Bld: 140 mg/dL — ABNORMAL HIGH (ref 70–99)
Total Bilirubin: 0.4 mg/dL (ref 0.3–1.2)

## 2011-04-11 NOTE — Progress Notes (Signed)
Addended by: Faythe Casa on: 04/11/2011 11:07 AM   Modules accepted: Orders

## 2011-04-12 LAB — CULTURE, OB URINE

## 2011-04-13 ENCOUNTER — Encounter: Payer: Self-pay | Admitting: Internal Medicine

## 2011-04-13 DIAGNOSIS — R7302 Impaired glucose tolerance (oral): Secondary | ICD-10-CM

## 2011-04-13 DIAGNOSIS — Z0000 Encounter for general adult medical examination without abnormal findings: Secondary | ICD-10-CM | POA: Insufficient documentation

## 2011-04-13 DIAGNOSIS — R739 Hyperglycemia, unspecified: Secondary | ICD-10-CM

## 2011-04-13 DIAGNOSIS — D649 Anemia, unspecified: Secondary | ICD-10-CM | POA: Insufficient documentation

## 2011-04-13 HISTORY — DX: Hyperglycemia, unspecified: R73.9

## 2011-04-13 HISTORY — DX: Impaired glucose tolerance (oral): R73.02

## 2011-04-15 ENCOUNTER — Encounter: Payer: Self-pay | Admitting: Advanced Practice Midwife

## 2011-04-15 LAB — HEMOGLOBINOPATHY EVALUATION
Hemoglobin Other: 0 %
Hgb A: 97.2 % (ref 96.8–97.8)
Hgb F Quant: 0 % (ref 0.0–2.0)
Hgb S Quant: 0 %

## 2011-04-16 ENCOUNTER — Encounter: Payer: Self-pay | Admitting: Internal Medicine

## 2011-04-17 ENCOUNTER — Ambulatory Visit (HOSPITAL_COMMUNITY)
Admission: RE | Admit: 2011-04-17 | Discharge: 2011-04-17 | Disposition: A | Discharge status: Home / Self Care | Payer: Self-pay | Source: Ambulatory Visit | Attending: Advanced Practice Midwife | Admitting: Advanced Practice Midwife

## 2011-04-17 ENCOUNTER — Ambulatory Visit: Payer: Self-pay | Admitting: Internal Medicine

## 2011-04-17 ENCOUNTER — Other Ambulatory Visit: Payer: Self-pay | Admitting: Obstetrics & Gynecology

## 2011-04-17 ENCOUNTER — Encounter (HOSPITAL_COMMUNITY): Payer: Self-pay

## 2011-04-17 DIAGNOSIS — R1084 Generalized abdominal pain: Secondary | ICD-10-CM

## 2011-04-17 DIAGNOSIS — O358XX0 Maternal care for other (suspected) fetal abnormality and damage, not applicable or unspecified: Secondary | ICD-10-CM

## 2011-04-17 DIAGNOSIS — Z331 Pregnant state, incidental: Secondary | ICD-10-CM

## 2011-04-17 DIAGNOSIS — O09529 Supervision of elderly multigravida, unspecified trimester: Secondary | ICD-10-CM | POA: Insufficient documentation

## 2011-04-17 DIAGNOSIS — O169 Unspecified maternal hypertension, unspecified trimester: Secondary | ICD-10-CM

## 2011-04-17 DIAGNOSIS — O10019 Pre-existing essential hypertension complicating pregnancy, unspecified trimester: Secondary | ICD-10-CM

## 2011-04-17 DIAGNOSIS — Z0289 Encounter for other administrative examinations: Secondary | ICD-10-CM

## 2011-04-17 DIAGNOSIS — O0930 Supervision of pregnancy with insufficient antenatal care, unspecified trimester: Secondary | ICD-10-CM

## 2011-04-17 NOTE — Progress Notes (Signed)
MATERNAL FETAL MEDICINE CONSULT  Patient Name: Erica [name] Medical Record Number:  [id number] Date of Birth: 1976/02/12 Requesting Provider Name:  Wynelle Bourgeois, CNM Date of Service: 04/17/2011  Chief Complaint Multiple fetal radiation and medication exposure  History of Present Illness Erica [name] was seen today for prenatal diagnosis secondary to multiple fetal radiation and medication expsoures at the request of [name].  The patient is a 36 y.o. [id number], at [redacted]w[redacted]d with and EDC of 05/27/2011, by Ultrasound today.  She first learned of this pregnancy on 03/18/11 (approximately 30 weeks of gestation) when she had a CT scan as part of a work-up for decreased appetite, early satiety, and abdominal pain.  [name] was diagnosed with left sided breast cancer in 2010.  She underwent a lumpectomy, lymph node dissection, and adjuvant chemotherapy and radiation which was completed in 2011.  She had prolonged amenorrhea thereafter.  Although she is in remission from her breast cancer, she has had several medical issues that have required diagnostic imaging and medication therapy during this pregnancy.  She had a technetium bone scan on 08/23/10, which was around the time of conception, but could have been at 2-3 weeks of pregnancy as her EDC is difficult to pinpoint based on her pregnancy being dated by an ultrasound today.  She also had a CT of the chest and an MRI with contrast of the chest and brain in June of 2012 around 2-3 weeks of pregnancy.  A CT of the abdomen and pelvis was performed on 03/18/11 which identified the pregnancy was at 30 weeks of gestation.  She has taken several antihypertensive medications during pregnancy including HCTZ, clonidine, metoprolol, and lisinopril.  Lisinopril was taken continuously from prior to pregnancy until approximately 12 weeks of pregnancy.  The patient has also taken albuterol, Advair, Dulera, Singulair, and prednisone for control of her asthma during  pregnancy.  Prednisone was discontinued at approximately 12 weeks of pregnancy.  She continues to take the remainder of her asthma medications.  She currently uses her albuterol inhaler 2-3 times during the day and nearly every day at night.  She reports her blood pressure has not been well controlled recently with her average BP being 170/100 or greater.  She has recently been prescribed amlodipine by her primary MD for this.  Review of Systems Pertinent items are noted in HPI.  Patient History OB History    Grav Para Term Preterm Abortions TAB SAB Ect Mult Living   5 3 3  1  1   3      # Outc Date GA Lbr Len/2nd Wgt Sex Del Anes PTL Lv   1 TRM 9/95 [redacted]w[redacted]d   F SVD None  Yes   2 TRM 5/98    F SVD None  Yes   3 TRM 3/09    F SVD EPI  Yes   4 SAB 1/10 [redacted]w[redacted]d       SB   Comments: PPROM at 17 weeks, then had retained placenta with D&C afterward   5 CUR               Past Medical History  Diagnosis Date  . Breast cancer 2010  . Asthma   . Hypertension   . GERD (gastroesophageal reflux disease)   . Impaired glucose tolerance 04/13/2011  . Anemia, unspecified 04/13/2011  . Asthma 09/27/2010  . Bloating 03/18/2011  . 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/2010    Past Surgical History  Procedure Date  . Hand surgery   .  Breast lumpectomy 09/2008  . Carpal tunnel release     left hand    History   Social History  . Marital Status: Single    Spouse Name: N/A    Number of Children: 3  . Years of Education: N/A   Occupational History  .     Social History Main Topics  . Smoking status: Never Smoker   . Smokeless tobacco: Never Used  . Alcohol Use: No  . Drug Use: No  . Sexually Active: Yes   Other Topics Concern  . Not on file   Social History Narrative  . No narrative on file    Family History  Problem Relation Age of Onset  . Diabetes Maternal Grandmother   . Heart disease Maternal Grandmother    In addition, the patient has no family history of mental retardation, birth  defects, or genetic diseases.  Medications Current outpatient prescriptions:albuterol (PROVENTIL) (2.5 MG/3ML) 0.083% nebulizer solution, Take 2.5 mg by nebulization every 4 (four) hours as needed.  , Disp: , Rfl: ;  amLODipine (NORVASC) 10 MG tablet, Take 1 tablet (10 mg total) by mouth daily., Disp: 30 tablet, Rfl: 1;  docusate sodium (COLACE) 100 MG capsule, Take 1 capsule (100 mg total) by mouth daily as needed for constipation., Disp: 30 capsule, Rfl: 1 Fluticasone-Salmeterol (ADVAIR) 250-50 MCG/DOSE AEPB, Inhale 1 puff into the lungs every 12 (twelve) hours.  , Disp: , Rfl: ;  Mometasone Furo-Formoterol Fum (DULERA) 200-5 MCG/ACT AERO, Inhale 2 puffs into the lungs 2 (two) times daily., Disp: 1 Inhaler, Rfl: 6;  montelukast (SINGULAIR) 10 MG tablet, Take 10 mg by mouth at bedtime. , Disp: , Rfl:    Physical Examination There were no vitals filed for this visit. General appearance - alert, well appearing, and in no distress Abdomen - soft, nontender, nondistended, no masses or organomegaly  Assessment and Recommendations 1.  Fetal radiation exposure.  The patient has had several diagnostic imaging studies including a technetium bone scan.  Fortunately, technetium has a half life of only 6 hours and is rapidly excreted from the body in the urine.  The majority of her exposure was possibly soon before or 2-3 weeks after conception.  Thus, this exposure in unlikely to increase the risk of congenital anomalies.  Her CT and MRI scans around conception are also unlikely to have resulted in a dangerous amount of fetal exposure.  She did however receive gadolinium in the first trimester, which is known to cross the placenta.  This is mostly a theoretical concern as a link between gadolinium exposure and congenital anomalies has not been seen.  In addition, the patient's fetal anatomic survey today did not identify any fetal anomalies.  Her most recent CT scan was performed at 30 weeks and resulted in a  clinically insignificant amount of fetal radiation exposure.  2.  Fetal medication exposure.  The patient was on several medications during the first trimester of pregnancy.  The most significant of which are prednisone and lisinopril.  The remainder of her medications carry a low risk of fetal anomalies.  Prednisone is associated with cleft lip and palate when used before 10 weeks of pregnancy.  No evidence of cleft lip/palate was found on today's scan.  Lisinopril is associated with renal dysplasia and renal failure.  Fortunately, the fetal kidneys appear normal and the amniotic fluid volume is also normal. 3.  Medical co-morbidities.  At this time the chance of fetal harm from her multiple radiation and medication exposures  is relatively small.  Her asthma and hypertension, neither of which are well controlled at this time, pose a much greater risk to the pregnancy.  She is beginning prenatal care in the high risk clinic here at the [facility] and has been started on anti-hypertensive medication.  I recommend being aggressive with these medications to rapidly improve her blood pressure to levels below 160/110.  She is also scheduled to see a new pulmonologist for her asthma.  She is requiring frequent use of her rescue medications, and requires medication adjustment to improve asthma control.  Restarting prednisone should be entertained if alterations of her inhaled medications does not provide significant improvement.  We will see her back weekly for the time being, mostly to monitor her medical condition rather than fetal condition.  I spent 45 minutes with Ms. Bergh today of which 50% was face-to-face counseling.   Rema Fendt, MD

## 2011-04-18 ENCOUNTER — Ambulatory Visit (INDEPENDENT_AMBULATORY_CARE_PROVIDER_SITE_OTHER): Payer: Self-pay | Admitting: Physician Assistant

## 2011-04-18 VITALS — BP 151/93 | HR 113 | Temp 98.1°F | Wt 209.8 lb

## 2011-04-18 DIAGNOSIS — I10 Essential (primary) hypertension: Secondary | ICD-10-CM

## 2011-04-18 DIAGNOSIS — O10919 Unspecified pre-existing hypertension complicating pregnancy, unspecified trimester: Secondary | ICD-10-CM

## 2011-04-18 DIAGNOSIS — O10019 Pre-existing essential hypertension complicating pregnancy, unspecified trimester: Secondary | ICD-10-CM

## 2011-04-18 LAB — POCT URINALYSIS DIP (DEVICE)
Bilirubin Urine: NEGATIVE
Ketones, ur: NEGATIVE mg/dL
Leukocytes, UA: NEGATIVE
Protein, ur: NEGATIVE mg/dL

## 2011-04-18 MED ORDER — LABETALOL HCL 200 MG PO TABS: 200.0000 mg | ORAL_TABLET | Freq: Two times a day (BID) | ORAL | Status: DC

## 2011-04-18 NOTE — Progress Notes (Signed)
Addended by: Gerome Apley on: 04/18/2011 10:28 AM   Modules accepted: Orders

## 2011-04-18 NOTE — Progress Notes (Signed)
No complaints. + FM, occasional ctx. Denies s/s pre-x. Using rescue inhaler 2-3 times daily for asthma control. Will start Labetalol 200mg  BID in addition to Norvasc. Start 2 x weekly testing. RN to coordinate NST and weekly visits with MFM to decrease the number of visits pt has per week. Anatomy scan: grossly NL. Desires pp BTS, will sign papers today.

## 2011-04-18 NOTE — Patient Instructions (Signed)
Hypertension During Pregnancy Hypertension is also called high blood pressure. It can occur at any time in life and during pregnancy. When you have hypertension, there is extra pressure inside your blood vessels that carry blood from the heart to the rest of your body (arteries). Hypertension during pregnancy can cause problems for you and your baby. Your baby might not weigh as much as it should at birth or might be born early (premature). Very bad cases of hypertension during pregnancy can be life-threatening.  There are different types of hypertension during pregnancy.   Chronic hypertension. This happens when a woman has hypertension before pregnancy and it continues during pregnancy.   Gestational hypertension. This is when hypertension develops during pregnancy.   Preeclampsia or toxemia of pregnancy. This is a very serious type of hypertension that develops only during pregnancy. It is a disease that affects the whole body (systemic) and can be very dangerous for both mother and baby.   Gestational hypertension and preeclampsia usually go away after your baby is born. Blood pressure generally stabilizes within 6 weeks. Women who have hypertension during pregnancy have a greater chance of developing hypertension later in life or with future pregnancies. UNDERSTANDING BLOOD PRESSURE Blood pressure moves blood in your body. Sometimes, the force that moves the blood becomes too strong.  A blood pressure reading is given in 2 numbers and looks like a fraction.   The top number is called the systolic pressure. When your heart beats, it forces more blood to flow through the arteries. Pressure inside the arteries goes up.   The bottom number is the diastolic pressure. Pressure goes down between beats. That is when the heart is resting.   You may have hypertension if:   Your systolic blood pressure is above 140.   Your diastolic pressure is above 90.  RISK FACTORS Some factors make you more  likely to develop hypertension during pregnancy. Risk factors include:  Having hypertension before pregnancy.   Having hypertension during a previous pregnancy.   Being overweight.   Being older than 40.   Being pregnant with more than 1 baby (multiples).   Having diabetes or kidney problems.  SYMPTOMS Chronic and gestational hypertension may not cause symptoms. Preeclampsia has symptoms, which may include:  Increased protein in your urine. Your caregiver will check for this at every prenatal visit.   Swelling of your hands and face.   Rapid weight gain.   Headaches.   Visual changes.   Being bothered by light.   Abdominal pain, especially in the right upper area.   Chest pain.   Shortness of breath.   Increased reflexes.   Seizures. Seizures occur with a more severe form of preeclampsia, called eclampsia.  DIAGNOSIS   You may be diagnosed with hypertension during pregnancy during a regular prenatal exam. At each visit, tests may include:   Blood pressure checks.   A urine test to check for protein in your urine.   The type of hypertension you are diagnosed with depends on when you developed it. It also depends on your specific blood pressure reading.   Developing hypertension before 20 weeks of pregnancy is consistent with chronic hypertension.   Developing hypertension after 20 weeks of pregnancy is consistent with gestational hypertension.   Hypertension with increased urinary protein is diagnosed as preeclampsia.   Blood pressure measurements that stay above 160 systolic or 110 diastolic are a sign of severe preeclampsia.  TREATMENT Treatment for hypertension during pregnancy varies. Treatment depends on   the type of hypertension and how serious it is.  If you take medicine for chronic hypertension, you may need to switch medicines.   Drugs called ACE inhibitors should not be taken during pregnancy.   Low-dose aspirin may be suggested for women who have  risk factors for preeclampsia.   If you have gestational hypertension, you may need to take a blood pressure medicine that is safe during pregnancy. Your caregiver will recommend the appropriate medicine.   If you have severe preeclampsia, you may need to be in the hospital. Caregivers will watch you and the baby very closely. You also may need to take medicine (magnesium sulfate) to prevent seizures and lower blood pressure.   Sometimes an early delivery is needed. This may be the case if the condition worsens. It would be done to protect you and the baby. The only cure for preeclampsia is delivery.  HOME CARE INSTRUCTIONS  Schedule and keep all of your regular prenatal care.   Follow your caregiver's instructions for taking medicines. Tell your caregiver about all medicines you take. This includes over-the-counter medicines.   Eat as little salt as possible.   Get regular exercise.   Do not drink alcohol.   Do not use tobacco products.   Do not drink products with caffeine.   Lie on your left side when resting.   Tell your doctor if you have any preeclampsia symptoms.  SEEK IMMEDIATE MEDICAL CARE IF:  You have severe abdominal pain.   You have sudden swelling in the hands, ankles, or face.   You gain 4 pounds (1.8 kg) or more in 1 week.   You vomit repeatedly.   You have vaginal bleeding.   You do not feel the baby moving as much.   You have a headache.   You have blurred or double vision.   You have muscle twitching or spasms.   You have shortness of breath.   You have blue fingernails and lips.   You have blood in your urine.  MAKE SURE YOU:  Understand these instructions.   Will watch your condition.   Will get help right away if you are not doing well.  Document Released: 12/04/2010 Document Reviewed: 11/02/2010 [employer] Patient Information 2012 [employer]. 

## 2011-04-19 ENCOUNTER — Encounter: Payer: Self-pay | Admitting: Obstetrics & Gynecology

## 2011-04-19 LAB — PROTEIN / CREATININE RATIO, URINE: Creatinine, Urine: 37.3 mg/dL

## 2011-04-22 ENCOUNTER — Ambulatory Visit (INDEPENDENT_AMBULATORY_CARE_PROVIDER_SITE_OTHER): Payer: Self-pay | Admitting: *Deleted

## 2011-04-22 VITALS — BP 165/92

## 2011-04-22 DIAGNOSIS — O10019 Pre-existing essential hypertension complicating pregnancy, unspecified trimester: Secondary | ICD-10-CM

## 2011-04-22 DIAGNOSIS — O10919 Unspecified pre-existing hypertension complicating pregnancy, unspecified trimester: Secondary | ICD-10-CM

## 2011-04-22 NOTE — Progress Notes (Signed)
P = 123   NST only today.  Pt denies sx of pre-eclampsia.  States she had used Albuteral inhaler 1 hr prior to visit today.

## 2011-04-25 ENCOUNTER — Ambulatory Visit (HOSPITAL_COMMUNITY)
Admission: RE | Admit: 2011-04-25 | Discharge: 2011-04-25 | Disposition: A | Discharge status: Home / Self Care | Payer: Self-pay | Source: Ambulatory Visit | Attending: Advanced Practice Midwife | Admitting: Advanced Practice Midwife

## 2011-04-25 ENCOUNTER — Other Ambulatory Visit: Payer: Self-pay | Admitting: Family Medicine

## 2011-04-25 ENCOUNTER — Ambulatory Visit (HOSPITAL_COMMUNITY)
Admission: RE | Admit: 2011-04-25 | Discharge: 2011-04-25 | Disposition: A | Discharge status: Home / Self Care | Payer: Self-pay | Source: Ambulatory Visit | Attending: Family Medicine | Admitting: Family Medicine

## 2011-04-25 ENCOUNTER — Ambulatory Visit (INDEPENDENT_AMBULATORY_CARE_PROVIDER_SITE_OTHER): Payer: Self-pay | Admitting: Physician Assistant

## 2011-04-25 VITALS — BP 160/84 | Temp 96.9°F | Wt 209.5 lb

## 2011-04-25 DIAGNOSIS — J45909 Unspecified asthma, uncomplicated: Secondary | ICD-10-CM | POA: Insufficient documentation

## 2011-04-25 DIAGNOSIS — O99891 Other specified diseases and conditions complicating pregnancy: Secondary | ICD-10-CM | POA: Insufficient documentation

## 2011-04-25 DIAGNOSIS — O10019 Pre-existing essential hypertension complicating pregnancy, unspecified trimester: Secondary | ICD-10-CM

## 2011-04-25 DIAGNOSIS — O10919 Unspecified pre-existing hypertension complicating pregnancy, unspecified trimester: Secondary | ICD-10-CM

## 2011-04-25 LAB — BASIC METABOLIC PANEL
CO2: 20 mEq/L (ref 19–32)
Calcium: 8.6 mg/dL (ref 8.4–10.5)
GFR calc Af Amer: >90 mL/min (ref >90)
GFR calc non Af Amer: >90 mL/min (ref >90)
Sodium: 133 mEq/L — ABNORMAL LOW (ref 135–145)

## 2011-04-25 LAB — CBC
MCH: 30.7 pg (ref 26.0–34.0)
MCHC: 34.6 g/dL (ref 30.0–36.0)
Platelets: 202 10*3/uL (ref 150–400)
RBC: 3.75 MIL/uL — ABNORMAL LOW (ref 3.87–5.11)

## 2011-04-25 LAB — POCT URINALYSIS DIP (DEVICE)
Glucose, UA: NEGATIVE mg/dL
Hgb urine dipstick: NEGATIVE
Nitrite: NEGATIVE
Urobilinogen, UA: >=8 mg/dL (ref 0.0–1.0)

## 2011-04-25 NOTE — Progress Notes (Signed)
Will repeat with manual cuff. No pre-x s/s. + FM. GBS/GC/Chl at next visit

## 2011-04-25 NOTE — Progress Notes (Signed)
No pain. Pressure in pelvic area.  

## 2011-04-25 NOTE — Progress Notes (Signed)
bp taken again while on NST 164/90, then manually again 160/84

## 2011-04-25 NOTE — Patient Instructions (Signed)
Hypertension During Pregnancy Hypertension is also called high blood pressure. It can occur at any time in life and during pregnancy. When you have hypertension, there is extra pressure inside your blood vessels that carry blood from the heart to the rest of your body (arteries). Hypertension during pregnancy can cause problems for you and your baby. Your baby might not weigh as much as it should at birth or might be born early (premature). Very bad cases of hypertension during pregnancy can be life-threatening.  There are different types of hypertension during pregnancy.   Chronic hypertension. This happens when a woman has hypertension before pregnancy and it continues during pregnancy.   Gestational hypertension. This is when hypertension develops during pregnancy.   Preeclampsia or toxemia of pregnancy. This is a very serious type of hypertension that develops only during pregnancy. It is a disease that affects the whole body (systemic) and can be very dangerous for both mother and baby.   Gestational hypertension and preeclampsia usually go away after your baby is born. Blood pressure generally stabilizes within 6 weeks. Women who have hypertension during pregnancy have a greater chance of developing hypertension later in life or with future pregnancies. UNDERSTANDING BLOOD PRESSURE Blood pressure moves blood in your body. Sometimes, the force that moves the blood becomes too strong.  A blood pressure reading is given in 2 numbers and looks like a fraction.   The top number is called the systolic pressure. When your heart beats, it forces more blood to flow through the arteries. Pressure inside the arteries goes up.   The bottom number is the diastolic pressure. Pressure goes down between beats. That is when the heart is resting.   You may have hypertension if:   Your systolic blood pressure is above 140.   Your diastolic pressure is above 90.  RISK FACTORS Some factors make you more  likely to develop hypertension during pregnancy. Risk factors include:  Having hypertension before pregnancy.   Having hypertension during a previous pregnancy.   Being overweight.   Being older than 40.   Being pregnant with more than 1 baby (multiples).   Having diabetes or kidney problems.  SYMPTOMS Chronic and gestational hypertension may not cause symptoms. Preeclampsia has symptoms, which may include:  Increased protein in your urine. Your caregiver will check for this at every prenatal visit.   Swelling of your hands and face.   Rapid weight gain.   Headaches.   Visual changes.   Being bothered by light.   Abdominal pain, especially in the right upper area.   Chest pain.   Shortness of breath.   Increased reflexes.   Seizures. Seizures occur with a more severe form of preeclampsia, called eclampsia.  DIAGNOSIS   You may be diagnosed with hypertension during pregnancy during a regular prenatal exam. At each visit, tests may include:   Blood pressure checks.   A urine test to check for protein in your urine.   The type of hypertension you are diagnosed with depends on when you developed it. It also depends on your specific blood pressure reading.   Developing hypertension before 20 weeks of pregnancy is consistent with chronic hypertension.   Developing hypertension after 20 weeks of pregnancy is consistent with gestational hypertension.   Hypertension with increased urinary protein is diagnosed as preeclampsia.   Blood pressure measurements that stay above 160 systolic or 110 diastolic are a sign of severe preeclampsia.  TREATMENT Treatment for hypertension during pregnancy varies. Treatment depends on   the type of hypertension and how serious it is.  If you take medicine for chronic hypertension, you may need to switch medicines.   Drugs called ACE inhibitors should not be taken during pregnancy.   Low-dose aspirin may be suggested for women who have  risk factors for preeclampsia.   If you have gestational hypertension, you may need to take a blood pressure medicine that is safe during pregnancy. Your caregiver will recommend the appropriate medicine.   If you have severe preeclampsia, you may need to be in the hospital. Caregivers will watch you and the baby very closely. You also may need to take medicine (magnesium sulfate) to prevent seizures and lower blood pressure.   Sometimes an early delivery is needed. This may be the case if the condition worsens. It would be done to protect you and the baby. The only cure for preeclampsia is delivery.  HOME CARE INSTRUCTIONS  Schedule and keep all of your regular prenatal care.   Follow your caregiver's instructions for taking medicines. Tell your caregiver about all medicines you take. This includes over-the-counter medicines.   Eat as little salt as possible.   Get regular exercise.   Do not drink alcohol.   Do not use tobacco products.   Do not drink products with caffeine.   Lie on your left side when resting.   Tell your doctor if you have any preeclampsia symptoms.  SEEK IMMEDIATE MEDICAL CARE IF:  You have severe abdominal pain.   You have sudden swelling in the hands, ankles, or face.   You gain 4 pounds (1.8 kg) or more in 1 week.   You vomit repeatedly.   You have vaginal bleeding.   You do not feel the baby moving as much.   You have a headache.   You have blurred or double vision.   You have muscle twitching or spasms.   You have shortness of breath.   You have blue fingernails and lips.   You have blood in your urine.  MAKE SURE YOU:  Understand these instructions.   Will watch your condition.   Will get help right away if you are not doing well.  Document Released: 12/04/2010 Document Reviewed: 11/02/2010 [employer] Patient Information 2012 [employer]. 

## 2011-04-25 NOTE — Progress Notes (Signed)
Ms Godino presents today for follow up MFM consultation.  Pleas see prior MFM notes for complete history and prior counseling and discussion.    Ms. Kohl is at 86 3/7 weeks by poor dating criteria.  She was feeling well with no new symptoms.  Her asthma is stable and she is planning pulmonology evaluation.  She continues to use rescue therapy regularly.     NST was performed in the [facility] today and AFI was WNL on ultrasound in our office today.  Please see full report in [facility].    BP today was 155/99 and repeat 158/94.  Ms. Easterwood reports compliance with her current antihypertensives, Norvasc 10 mg QD and Labetolol 200mg  BID.  These were recently adjusted.  24 hour urine on 04/18/11 was negative for significant proteinuria.  Urine dip today in the clinic reveals 1-2+ protein.  This is a change from previous evaluations.    Given her increased BPs and increased protein on urine dip, repeat 24 hour urine was ordered today along with a CBC and CMP.  If any of these indicate the presence of preeclampsia, inpatient management is recommended.  If these are negative for preeclampsia, further adjustments in Ms. Hohman's antihypertensives can be undertaken with a goal to maintain BPs under 150/90.  Increasing her dose of labetolol to TID dosing may be an effective first step.  Deliver for any evidence of severe preeclampsia.  Amniocentesis to determine fetal lung maturity may be of benefit, if stable preeclampsia is diagnosed, with a plan to proceed with delivery, if amniocentesis is consistent with maturity.   If pregnancy is ongoing, recommend twice weekly NSTs with weekly AFI until delivery.  Recommend repeat ultrasound in 2 weeks to re evaluate fetal growth.  If a non urgent delivery is considered before [redacted] weeks gestation, amniocentesis for determination of fetal lung maturity may be of benefit, due to Ms. Touch's poor pregnancy dating.     The signs and symptoms of preeclampsia were reviewed today  and precautions given.  Ms. Romanek was also advised to seek medical evaluation expeditiously for any persistent exacerbation of her asthma.     We will see her back in our office in 1 week for an AFI and re evaluation.  Please contact us at any time if we can be of assistance with this or any other patient you may have.

## 2011-04-25 NOTE — Progress Notes (Signed)
AFI performed today.  Results reassuring.  Please see full report in [facility].

## 2011-04-29 ENCOUNTER — Ambulatory Visit (INDEPENDENT_AMBULATORY_CARE_PROVIDER_SITE_OTHER): Payer: Self-pay | Admitting: *Deleted

## 2011-04-29 VITALS — BP 151/81 | Wt 206.4 lb

## 2011-04-29 DIAGNOSIS — O10019 Pre-existing essential hypertension complicating pregnancy, unspecified trimester: Secondary | ICD-10-CM

## 2011-04-29 NOTE — Progress Notes (Signed)
NST 04/15/11 reviewed and reactive 

## 2011-04-29 NOTE — Progress Notes (Signed)
P = 105    NST only today.  Pt denies H/a or visual disturbances.

## 2011-04-29 NOTE — Progress Notes (Signed)
NST today reviewed, reactive 

## 2011-05-02 ENCOUNTER — Ambulatory Visit (INDEPENDENT_AMBULATORY_CARE_PROVIDER_SITE_OTHER): Payer: Self-pay | Admitting: Obstetrics & Gynecology

## 2011-05-02 ENCOUNTER — Ambulatory Visit (HOSPITAL_COMMUNITY)
Admission: RE | Admit: 2011-05-02 | Discharge: 2011-05-02 | Disposition: A | Discharge status: Home / Self Care | Payer: Self-pay | Source: Ambulatory Visit | Attending: Family Medicine | Admitting: Family Medicine

## 2011-05-02 ENCOUNTER — Other Ambulatory Visit: Payer: Self-pay | Admitting: Obstetrics & Gynecology

## 2011-05-02 VITALS — BP 157/90 | Temp 97.6°F | Wt 208.0 lb

## 2011-05-02 DIAGNOSIS — O10919 Unspecified pre-existing hypertension complicating pregnancy, unspecified trimester: Secondary | ICD-10-CM

## 2011-05-02 DIAGNOSIS — O0930 Supervision of pregnancy with insufficient antenatal care, unspecified trimester: Secondary | ICD-10-CM | POA: Insufficient documentation

## 2011-05-02 DIAGNOSIS — O09529 Supervision of elderly multigravida, unspecified trimester: Secondary | ICD-10-CM | POA: Insufficient documentation

## 2011-05-02 DIAGNOSIS — R7302 Impaired glucose tolerance (oral): Secondary | ICD-10-CM

## 2011-05-02 DIAGNOSIS — R7309 Other abnormal glucose: Secondary | ICD-10-CM

## 2011-05-02 DIAGNOSIS — O10019 Pre-existing essential hypertension complicating pregnancy, unspecified trimester: Secondary | ICD-10-CM

## 2011-05-02 DIAGNOSIS — B373 Candidiasis of vulva and vagina: Secondary | ICD-10-CM

## 2011-05-02 DIAGNOSIS — O0990 Supervision of high risk pregnancy, unspecified, unspecified trimester: Secondary | ICD-10-CM

## 2011-05-02 LAB — POCT URINALYSIS DIP (DEVICE)
Bilirubin Urine: NEGATIVE
Glucose, UA: NEGATIVE mg/dL
Nitrite: NEGATIVE

## 2011-05-02 LAB — COMPREHENSIVE METABOLIC PANEL
ALT: 15 U/L (ref 0–35)
Alkaline Phosphatase: 65 U/L (ref 39–117)
Creat: 0.64 mg/dL (ref 0.50–1.10)
Glucose, Bld: 87 mg/dL (ref 70–99)
Sodium: 134 mEq/L — ABNORMAL LOW (ref 135–145)
Total Bilirubin: 0.4 mg/dL (ref 0.3–1.2)
Total Protein: 6.4 g/dL (ref 6.0–8.3)

## 2011-05-02 LAB — CBC
MCH: 30.6 pg (ref 26.0–34.0)
MCHC: 34.5 g/dL (ref 30.0–36.0)
Platelets: 196 10*3/uL (ref 150–400)

## 2011-05-02 NOTE — Patient Instructions (Signed)
Return to clinic for any obstetric concerns or go to [facility] for evaluation  

## 2011-05-02 NOTE — Progress Notes (Addendum)
No symptoms of preeclampsia.  Will check labs today, follow up U/S today.  NST is reactive today.  Will also increase Labetalol to 400mg  bid. Continue close monitoring.  Pelvic cultures done today. Continue twice a week testing as recommended.  05/03/11 Wet prep showed yeast; Diflucan e-prescribed for patient.

## 2011-05-02 NOTE — Progress Notes (Signed)
Pelvic pressure. Pulse 105. No vaginal discharge.

## 2011-05-03 LAB — PROTEIN / CREATININE RATIO, URINE: Total Protein, Urine: 11 mg/dL

## 2011-05-03 LAB — WET PREP, GENITAL
Clue Cells Wet Prep HPF POC: NONE SEEN
Trich, Wet Prep: NONE SEEN

## 2011-05-03 LAB — GC/CHLAMYDIA PROBE AMP, GENITAL
Chlamydia, DNA Probe: NEGATIVE
GC Probe Amp, Genital: NEGATIVE

## 2011-05-03 MED ORDER — FLUCONAZOLE 150 MG PO TABS: 150.0000 mg | ORAL_TABLET | Freq: Once | ORAL | Status: AC

## 2011-05-03 NOTE — Progress Notes (Signed)
Addended by: Jaynie Collins A on: 05/03/2011 09:16 AM   Modules accepted: Orders

## 2011-05-06 ENCOUNTER — Ambulatory Visit (INDEPENDENT_AMBULATORY_CARE_PROVIDER_SITE_OTHER): Payer: Self-pay | Admitting: *Deleted

## 2011-05-06 ENCOUNTER — Encounter: Payer: Self-pay | Admitting: Obstetrics & Gynecology

## 2011-05-06 VITALS — BP 159/89

## 2011-05-06 DIAGNOSIS — O99820 Streptococcus B carrier state complicating pregnancy: Secondary | ICD-10-CM | POA: Insufficient documentation

## 2011-05-06 DIAGNOSIS — O10019 Pre-existing essential hypertension complicating pregnancy, unspecified trimester: Secondary | ICD-10-CM

## 2011-05-06 LAB — CULTURE, BETA STREP (GROUP B ONLY)

## 2011-05-06 NOTE — Progress Notes (Signed)
P = 115  Pt denies H/A.

## 2011-05-07 NOTE — Progress Notes (Signed)
NST reviewed and reactive.  

## 2011-05-09 ENCOUNTER — Inpatient Hospital Stay (HOSPITAL_COMMUNITY)
Admission: AD | Admit: 2011-05-09 | Discharge: 2011-05-12 | DRG: 767 | Disposition: A | Discharge status: Home / Self Care | Payer: Self-pay | Source: Ambulatory Visit | Attending: Obstetrics & Gynecology | Admitting: Obstetrics & Gynecology

## 2011-05-09 ENCOUNTER — Encounter (HOSPITAL_COMMUNITY): Payer: Self-pay

## 2011-05-09 ENCOUNTER — Ambulatory Visit (HOSPITAL_COMMUNITY): Payer: Self-pay

## 2011-05-09 ENCOUNTER — Ambulatory Visit (INDEPENDENT_AMBULATORY_CARE_PROVIDER_SITE_OTHER): Payer: Self-pay | Admitting: *Deleted

## 2011-05-09 ENCOUNTER — Other Ambulatory Visit: Payer: Self-pay | Admitting: Obstetrics & Gynecology

## 2011-05-09 VITALS — BP 180/98 | Temp 97.5°F | Wt 207.9 lb

## 2011-05-09 DIAGNOSIS — O10019 Pre-existing essential hypertension complicating pregnancy, unspecified trimester: Secondary | ICD-10-CM

## 2011-05-09 DIAGNOSIS — Z302 Encounter for sterilization: Secondary | ICD-10-CM

## 2011-05-09 DIAGNOSIS — O1002 Pre-existing essential hypertension complicating childbirth: Principal | ICD-10-CM | POA: Diagnosis present

## 2011-05-09 DIAGNOSIS — R1084 Generalized abdominal pain: Secondary | ICD-10-CM

## 2011-05-09 DIAGNOSIS — C50919 Malignant neoplasm of unspecified site of unspecified female breast: Secondary | ICD-10-CM

## 2011-05-09 DIAGNOSIS — Z22330 Carrier of Group B streptococcus: Secondary | ICD-10-CM

## 2011-05-09 DIAGNOSIS — O358XX0 Maternal care for other (suspected) fetal abnormality and damage, not applicable or unspecified: Secondary | ICD-10-CM

## 2011-05-09 DIAGNOSIS — Z331 Pregnant state, incidental: Secondary | ICD-10-CM

## 2011-05-09 DIAGNOSIS — O9989 Other specified diseases and conditions complicating pregnancy, childbirth and the puerperium: Secondary | ICD-10-CM

## 2011-05-09 DIAGNOSIS — O99892 Other specified diseases and conditions complicating childbirth: Secondary | ICD-10-CM

## 2011-05-09 DIAGNOSIS — O169 Unspecified maternal hypertension, unspecified trimester: Secondary | ICD-10-CM

## 2011-05-09 DIAGNOSIS — O0930 Supervision of pregnancy with insufficient antenatal care, unspecified trimester: Secondary | ICD-10-CM

## 2011-05-09 DIAGNOSIS — O0990 Supervision of high risk pregnancy, unspecified, unspecified trimester: Secondary | ICD-10-CM

## 2011-05-09 DIAGNOSIS — O99820 Streptococcus B carrier state complicating pregnancy: Secondary | ICD-10-CM

## 2011-05-09 DIAGNOSIS — D649 Anemia, unspecified: Secondary | ICD-10-CM

## 2011-05-09 DIAGNOSIS — O10919 Unspecified pre-existing hypertension complicating pregnancy, unspecified trimester: Secondary | ICD-10-CM

## 2011-05-09 DIAGNOSIS — O0940 Supervision of pregnancy with grand multiparity, unspecified trimester: Secondary | ICD-10-CM

## 2011-05-09 LAB — CBC
HCT: 33.9 % — ABNORMAL LOW (ref 36.0–46.0)
Hemoglobin: 11.6 g/dL — ABNORMAL LOW (ref 12.0–15.0)
MCH: 30.3 pg (ref 26.0–34.0)
MCHC: 34.2 g/dL (ref 30.0–36.0)
MCV: 88.5 fL (ref 78.0–100.0)
Platelets: 184 10*3/uL (ref 150–400)
RBC: 3.83 MIL/uL — ABNORMAL LOW (ref 3.87–5.11)
RDW: 13.4 % (ref 11.5–15.5)
WBC: 5.6 10*3/uL (ref 4.0–10.5)

## 2011-05-09 LAB — COMPREHENSIVE METABOLIC PANEL WITH GFR
ALT: 13 U/L (ref 0–35)
AST: 15 U/L (ref 0–37)
Albumin: 2.8 g/dL — ABNORMAL LOW (ref 3.5–5.2)
Alkaline Phosphatase: 68 U/L (ref 39–117)
BUN: 5 mg/dL — ABNORMAL LOW (ref 6–23)
CO2: 20 meq/L (ref 19–32)
Calcium: 9.1 mg/dL (ref 8.4–10.5)
Chloride: 103 meq/L (ref 96–112)
Creatinine, Ser: 0.6 mg/dL (ref 0.50–1.10)
GFR calc Af Amer: >90 mL/min
GFR calc non Af Amer: >90 mL/min
Glucose, Bld: 89 mg/dL (ref 70–99)
Potassium: 3.7 meq/L (ref 3.5–5.1)
Sodium: 134 meq/L — ABNORMAL LOW (ref 135–145)
Total Bilirubin: 0.2 mg/dL — ABNORMAL LOW (ref 0.3–1.2)
Total Protein: 6.8 g/dL (ref 6.0–8.3)

## 2011-05-09 LAB — POCT URINALYSIS DIP (DEVICE)
Bilirubin Urine: NEGATIVE
Glucose, UA: NEGATIVE mg/dL
Ketones, ur: NEGATIVE mg/dL
Nitrite: NEGATIVE
Protein, ur: 30 mg/dL — AB
Specific Gravity, Urine: 1.025 (ref 1.005–1.030)
Urobilinogen, UA: 2 mg/dL — ABNORMAL HIGH (ref 0.0–1.0)
pH: 6.5 (ref 5.0–8.0)

## 2011-05-09 LAB — PROTEIN / CREATININE RATIO, URINE: Total Protein, Urine: 5.1 mg/dL

## 2011-05-09 LAB — WET PREP, GENITAL
Trich, Wet Prep: NONE SEEN
Yeast Wet Prep HPF POC: NONE SEEN

## 2011-05-09 LAB — HIV ANTIBODY (ROUTINE TESTING W REFLEX)

## 2011-05-09 LAB — RAPID HIV SCREEN (WH-MAU): Rapid HIV Screen: NONREACTIVE

## 2011-05-09 MED ORDER — FENTANYL 2.5 MCG/ML BUPIVACAINE 1/10 % EPIDURAL INFUSION (WH - ANES)
14.0000 mL/h | INTRAMUSCULAR | Status: DC
  Filled 2011-05-09: qty 60

## 2011-05-09 MED ORDER — CITRIC ACID-SODIUM CITRATE 334-500 MG/5ML PO SOLN: 30.0000 mL | ORAL | Status: DC | PRN

## 2011-05-09 MED ORDER — OXYTOCIN BOLUS FROM INFUSION
500.0000 mL | Freq: Once | INTRAVENOUS | Status: DC
  Filled 2011-05-09: qty 1000
  Filled 2011-05-09: qty 500

## 2011-05-09 MED ORDER — PHENYLEPHRINE 40 MCG/ML (10ML) SYRINGE FOR IV PUSH (FOR BLOOD PRESSURE SUPPORT): 80.0000 ug | PREFILLED_SYRINGE | INTRAVENOUS | Status: DC | PRN

## 2011-05-09 MED ORDER — EPHEDRINE 5 MG/ML INJ
10.0000 mg | INTRAVENOUS | Status: DC | PRN
  Filled 2011-05-09: qty 4

## 2011-05-09 MED ORDER — IPRATROPIUM BROMIDE 0.02 % IN SOLN
0.5000 mg | RESPIRATORY_TRACT | Status: DC | PRN
  Administered 2011-05-09 – 2011-05-10 (×8): 0.5 mg via RESPIRATORY_TRACT
  Filled 2011-05-09 (×5): qty 2.5

## 2011-05-09 MED ORDER — OXYCODONE-ACETAMINOPHEN 5-325 MG PO TABS
1.0000 | ORAL_TABLET | ORAL | Status: DC | PRN
  Administered 2011-05-09: 1 via ORAL
  Filled 2011-05-09: qty 1

## 2011-05-09 MED ORDER — OXYCODONE-ACETAMINOPHEN 5-325 MG PO TABS: 1.0000 | ORAL_TABLET | Freq: Once | ORAL | Status: DC

## 2011-05-09 MED ORDER — VANCOMYCIN HCL IN DEXTROSE 1-5 GM/200ML-% IV SOLN
1000.0000 mg | Freq: Two times a day (BID) | INTRAVENOUS | Status: DC
  Administered 2011-05-09 – 2011-05-10 (×2): 1000 mg via INTRAVENOUS
  Filled 2011-05-09 (×3): qty 200

## 2011-05-09 MED ORDER — DIPHENHYDRAMINE HCL 50 MG/ML IJ SOLN: 12.5000 mg | INTRAMUSCULAR | Status: DC | PRN

## 2011-05-09 MED ORDER — PHENYLEPHRINE 40 MCG/ML (10ML) SYRINGE FOR IV PUSH (FOR BLOOD PRESSURE SUPPORT)
80.0000 ug | PREFILLED_SYRINGE | INTRAVENOUS | Status: DC | PRN
  Administered 2011-05-10 (×2): 80 ug via INTRAVENOUS
  Filled 2011-05-09: qty 5

## 2011-05-09 MED ORDER — LACTATED RINGERS IV SOLN: 500.0000 mL | Freq: Once | INTRAVENOUS | Status: DC

## 2011-05-09 MED ORDER — OXYTOCIN 20 UNITS IN LACTATED RINGERS INFUSION - SIMPLE
125.0000 mL/h | Freq: Once | INTRAVENOUS | Status: AC
  Administered 2011-05-10: 125 mL/h via INTRAVENOUS

## 2011-05-09 MED ORDER — ALBUTEROL SULFATE (5 MG/ML) 0.5% IN NEBU
2.5000 mg | INHALATION_SOLUTION | RESPIRATORY_TRACT | Status: DC | PRN
  Administered 2011-05-09: 2.5 mg via RESPIRATORY_TRACT
  Filled 2011-05-09: qty 0.5

## 2011-05-09 MED ORDER — OXYTOCIN 20 UNITS IN LACTATED RINGERS INFUSION - SIMPLE
1.0000 m[IU]/min | INTRAVENOUS | Status: DC
  Administered 2011-05-09: 2 m[IU]/min via INTRAVENOUS
  Filled 2011-05-09: qty 1000

## 2011-05-09 MED ORDER — LACTATED RINGERS IV SOLN
INTRAVENOUS | Status: DC
  Administered 2011-05-09: 125 mL/h via INTRAVENOUS

## 2011-05-09 MED ORDER — ACETAMINOPHEN 325 MG PO TABS
650.0000 mg | ORAL_TABLET | ORAL | Status: DC | PRN
  Administered 2011-05-09: 650 mg via ORAL
  Filled 2011-05-09: qty 2

## 2011-05-09 MED ORDER — MOMETASONE FURO-FORMOTEROL FUM 200-5 MCG/ACT IN AERO
2.0000 | INHALATION_SPRAY | Freq: Two times a day (BID) | RESPIRATORY_TRACT | Status: DC
  Administered 2011-05-09 – 2011-05-12 (×6): 2 via RESPIRATORY_TRACT
  Filled 2011-05-09 (×7): qty 0.3

## 2011-05-09 MED ORDER — ALBUTEROL SULFATE HFA 108 (90 BASE) MCG/ACT IN AERS
2.0000 | INHALATION_SPRAY | RESPIRATORY_TRACT | Status: DC
  Administered 2011-05-09 – 2011-05-10 (×3): 2 via RESPIRATORY_TRACT

## 2011-05-09 MED ORDER — HYDRALAZINE HCL 20 MG/ML IJ SOLN
10.0000 mg | INTRAMUSCULAR | Status: DC | PRN
  Administered 2011-05-09: 10 mg via INTRAVENOUS
  Filled 2011-05-09: qty 1

## 2011-05-09 MED ORDER — MAGNESIUM SULFATE 40 G IN LACTATED RINGERS - SIMPLE
2.0000 g/h | INTRAVENOUS | Status: AC
  Administered 2011-05-10: 2 g/h via INTRAVENOUS
  Filled 2011-05-09 (×2): qty 500

## 2011-05-09 MED ORDER — TERBUTALINE SULFATE 1 MG/ML IJ SOLN: 0.2500 mg | Freq: Once | INTRAMUSCULAR | Status: AC | PRN

## 2011-05-09 MED ORDER — LACTATED RINGERS IV SOLN: 500.0000 mL | INTRAVENOUS | Status: DC | PRN

## 2011-05-09 MED ORDER — ALBUTEROL SULFATE (5 MG/ML) 0.5% IN NEBU
2.5000 mg | INHALATION_SOLUTION | RESPIRATORY_TRACT | Status: DC | PRN
  Administered 2011-05-09 – 2011-05-10 (×5): 2.5 mg via RESPIRATORY_TRACT
  Filled 2011-05-09 (×4): qty 0.5

## 2011-05-09 MED ORDER — ONDANSETRON HCL 4 MG/2ML IJ SOLN
4.0000 mg | Freq: Four times a day (QID) | INTRAMUSCULAR | Status: DC | PRN
  Administered 2011-05-09 – 2011-05-10 (×2): 4 mg via INTRAVENOUS
  Filled 2011-05-09 (×2): qty 2

## 2011-05-09 MED ORDER — IBUPROFEN 600 MG PO TABS: 600.0000 mg | ORAL_TABLET | Freq: Four times a day (QID) | ORAL | Status: DC | PRN

## 2011-05-09 MED ORDER — AMLODIPINE BESYLATE 10 MG PO TABS
10.0000 mg | ORAL_TABLET | Freq: Every day | ORAL | Status: DC
  Administered 2011-05-09 – 2011-05-12 (×4): 10 mg via ORAL
  Filled 2011-05-09 (×5): qty 1

## 2011-05-09 MED ORDER — MAGNESIUM SULFATE BOLUS VIA INFUSION
4.0000 g | Freq: Once | INTRAVENOUS | Status: AC
  Administered 2011-05-09: 4 g via INTRAVENOUS
  Filled 2011-05-09: qty 500

## 2011-05-09 MED ORDER — EPHEDRINE 5 MG/ML INJ: 10.0000 mg | INTRAVENOUS | Status: DC | PRN

## 2011-05-09 MED ORDER — LIDOCAINE HCL (PF) 1 % IJ SOLN
30.0000 mL | INTRAMUSCULAR | Status: DC | PRN
  Filled 2011-05-09: qty 30

## 2011-05-09 MED ORDER — FLUTICASONE-SALMETEROL 250-50 MCG/DOSE IN AEPB
1.0000 | INHALATION_SPRAY | Freq: Two times a day (BID) | RESPIRATORY_TRACT | Status: DC
  Administered 2011-05-09 – 2011-05-12 (×6): 1 via RESPIRATORY_TRACT
  Filled 2011-05-09: qty 14

## 2011-05-09 MED ORDER — ALBUTEROL SULFATE HFA 108 (90 BASE) MCG/ACT IN AERS
2.0000 | INHALATION_SPRAY | RESPIRATORY_TRACT | Status: DC | PRN
  Administered 2011-05-09: 2 via RESPIRATORY_TRACT
  Filled 2011-05-09: qty 6.7

## 2011-05-09 MED ORDER — FLEET ENEMA 7-19 GM/118ML RE ENEM: 1.0000 | ENEMA | RECTAL | Status: DC | PRN

## 2011-05-09 MED ORDER — LABETALOL HCL 200 MG PO TABS
400.0000 mg | ORAL_TABLET | Freq: Two times a day (BID) | ORAL | Status: DC
  Administered 2011-05-09 (×2): 400 mg via ORAL
  Filled 2011-05-09 (×3): qty 2

## 2011-05-09 MED ORDER — MONTELUKAST SODIUM 10 MG PO TABS
10.0000 mg | ORAL_TABLET | Freq: Every day | ORAL | Status: DC
  Administered 2011-05-09 – 2011-05-11 (×3): 10 mg via ORAL
  Filled 2011-05-09 (×3): qty 1

## 2011-05-09 NOTE — Progress Notes (Signed)
RT called to come give treatment to patient per orders

## 2011-05-09 NOTE — Progress Notes (Signed)
Erica [name] is a 36 y.o. [phone] at [redacted]w[redacted]d admitted for induction of labor due to hypertension.  Subjective: Pt feeling uc's a little more intensely, breathing well through them, states still not at point ready for epidural.  Denies SOB, difficulty breathing.  Objective: BP 150/81   Pulse 86   Temp(Src) 97.8 F (36.6 C) (Oral)   Resp 20   Ht 5\' 6"  (1.676 m)   Wt 93.895 kg (207 lb)   BMI 33.41 kg/m2   SpO2 99% I/O last 3 completed shifts: In: 1884.1 [P.O.:650; I.V.:1034.1; IV Piggyback:200] Out: 1300 [Urine:1300] Total I/O In: 1506.7 [P.O.:912; I.V.:594.7] Out: 1250 [Urine:1250]  FHT:  FHR: 125 bpm, variability: moderate,  accelerations:  Present,  decelerations:  Absent UC:   regular, every 2-4 minutes, not tracing very well on efm SVE:   Good 4 now/75/-2, soft, still slightly posterior  IUPC placed w/o difficulty to determine strength and frequency of uc's  Labs: Lab Results  Component Value Date   WBC 5.6 05/09/2011   HGB 11.6* 05/09/2011   HCT 33.9* 05/09/2011   MCV 88.5 05/09/2011   PLT 184 05/09/2011    Assessment / Plan: IOL secondary to hypertension, progressing slowly on pitocin, now at 30mu/min  Labor: Progressing slowly on pitocin, IUPC placed, continue increasing pitocin to acheive adequate mvu's Preeclampsia:  on magnesium sulfate, no signs or symptoms of toxicity, intake and ouput balanced, labs stable and no s/s pre-e Fetal Wellbeing:  Category I Pain Control:  Labor support without medications I/D:  Vancomycin for gbs+ Anticipated MOD:  NSVD  Cathie Beams, CNM present for exam/IUPC placement  Joellyn Haff, SNM 05/09/2011, 11:09 PM

## 2011-05-09 NOTE — Progress Notes (Signed)
Erica [name] is a 36 y.o. [phone] at [redacted]w[redacted]d admitted for induction of labor due to Hypertension.  Subjective: Pt comfortable, states uc's are increasing some in intensity, not at the point where she wants epidural yet.    Objective: BP 146/79   Pulse 92   Temp(Src) 97.8 F (36.6 C) (Oral)   Resp 20   Ht 5\' 6"  (1.676 m)   Wt 93.895 kg (207 lb)   BMI 33.41 kg/m2   SpO2 99% I/O last 3 completed shifts: In: 1884.1 [P.O.:650; I.V.:1034.1; IV Piggyback:200] Out: 1300 [Urine:1300] Total I/O In: 974.5 [P.O.:622; I.V.:352.5] Out: 625 [Urine:625]  FHT:  FHR: 130 bpm, variability: moderate,  accelerations:  Present,  decelerations:  Absent UC:   regular, every 2-4 minutes SVE:   Tight 4/70/-2, posterior  AROM small amount light bloody fluid  Labs: Lab Results  Component Value Date   WBC 5.6 05/09/2011   HGB 11.6* 05/09/2011   HCT 33.9* 05/09/2011   MCV 88.5 05/09/2011   PLT 184 05/09/2011    Assessment / Plan: Induction of labor due to hypertension,  progressing well on pitocin 11mu/min  Labor: progressing on pitocin, will continue to increase Preeclampsia:  no s/s, on magnesium 2gm/hr, no s/s toxicity Fetal Wellbeing:  Category I Pain Control:  Labor support without medications I/D:  vancomycin for gbs+ Anticipated MOD:  NSVD Epidural at maternal request  Cathie Beams, CNM present for exam and AROM.  Joellyn Haff, SNM 05/09/2011, 8:44 PM

## 2011-05-09 NOTE — Progress Notes (Signed)
Manual recheck bp- seen by Dr. Adrian Blackwater- sending to [facility], report called

## 2011-05-09 NOTE — Progress Notes (Signed)
Erica [name] is a 36 y.o. [phone] at [redacted]w[redacted]d   Subjective: Difficulty breathing after 2 puffs of albuterol. Respiratory therapy consulted.  Objective: BP 129/80  Pulse 99  Temp(Src) 98.5 F (36.9 C) (Oral)  Resp 26  Ht 5\' 6"  (1.676 m)  Wt 93.895 kg (207 lb)  BMI 33.41 kg/m2  SpO2 98%      FHT:  FHR: 150 bpm, variability: moderate,  accelerations:  Present,  decelerations:  Absent UC:   irregular SVE:   Dilation: 3 Effacement (%): 80 Station: -2 Exam by:: [email or web address]  Labs: Lab Results  Component Value Date   WBC 5.6 05/09/2011   HGB 11.6* 05/09/2011   HCT 33.9* 05/09/2011   MCV 88.5 05/09/2011   PLT 184 05/09/2011    Assessment / Plan: Induction of labor due to gestational hypertension,  progressing well on pitocin  Labor: Progressing on Pitocin, will continue to increase then AROM Preeclampsia:  on magnesium sulfate Fetal Wellbeing:  Category I Pain Control:  Epidural planned I/D:  vancomycin 1g Q12h Anticipated MOD:  NSVD   D. Piloto [employer]. MD PGY-1 05/09/2011, 2:35 PM

## 2011-05-09 NOTE — Progress Notes (Signed)
Erica [name] is a 36 y.o. [phone] at [redacted]w[redacted]d   Subjective: Feeling ok. Mild to moderated back pain. No dyspnea. No symptoms of preeclampsia. Pt has signed papers for BTL.  Objective: BP 150/92  Pulse 98  Temp(Src) 97.6 F (36.4 C) (Axillary)  Resp 20  Ht 5\' 6"  (1.676 m)  Wt 93.895 kg (207 lb)  BMI 33.41 kg/m2  SpO2 96%  BP around 140's/90's  FHT:  FHR: 150 bpm, variability: moderate,  accelerations:  Present,  decelerations:  Absent UC:   irregular, every 3-4 minutes SVE:   Dilation: 4 Effacement (%): 80 Station: -2 Exam by:: felkel,rn  Labs: Lab Results  Component Value Date   WBC 5.6 05/09/2011   HGB 11.6* 05/09/2011   HCT 33.9* 05/09/2011   MCV 88.5 05/09/2011   PLT 184 05/09/2011    Assessment / Plan: Induction of labor due to gestational hypertension,  progressing well on pitocin  Labor: Progressing on Pitocin, will continue to increase then AROM Preeclampsia:  on magnesium sulfate Fetal Wellbeing:  Category I Pain Control:  Epidural planned I/D:  Vancomycin Anticipated MOD:  NSVD   D. Piloto [employer]. MD PGY-1 05/09/2011, 5:36 PM

## 2011-05-09 NOTE — H&P (Signed)
Erica [name] is a 36 y.o. female presenting from high risk clinic due to be found to have severe range blood pressures in 170-180s/100-110s.. Patient has a history of CHTN and has been treated with multiple agents during pregnancy. She denies headache, vision changes, abdominal pain.  History OB History    Grav Para Term Preterm Abortions TAB SAB Ect Mult Living   5 3 3  1  1   3      Past Medical History  Diagnosis Date  . Breast cancer 2010  . Asthma   . Hypertension   . GERD (gastroesophageal reflux disease)   . Impaired glucose tolerance 04/13/2011  . Anemia, unspecified 04/13/2011  . Asthma 09/27/2010  . Bloating 03/18/2011  . 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 Hand surgery   . Breast lumpectomy 09/2008  . Carpal tunnel release     left hand   Family History: family history includes Diabetes in her maternal grandmother and Heart disease in her maternal grandmother. Social History:  reports that she has never smoked. She has never used smokeless tobacco. She reports that she does not drink alcohol or use illicit drugs.  Review of Systems  All other systems reviewed and are negative.   Dilation: 1.5, soft Station: Ballotable Exam by:: Dr. Aviva Signs Blood pressure 157/92, pulse 99. Maternal Exam:  Uterine Assessment: Contraction frequency is rare.   Abdomen: Patient reports no abdominal tenderness. Fetal presentation: vertex  Introitus: Normal vulva. Normal vagina.    Fetal Exam Fetal Monitor Review: Baseline rate: 140s.  Variability: moderate (6-25 bpm).   Pattern: accelerations present and no decelerations.    Fetal State Assessment: Category I - tracings are normal.     Physical Exam  Constitutional: She is oriented to person, place, and time. She appears well-developed and well-nourished.  HENT:  Head: Normocephalic and atraumatic.  Neck: Normal range of motion. Neck supple.  Cardiovascular: Regular rhythm.     Respiratory: Breath sounds normal.  GI: Soft. Bowel sounds are normal. She exhibits no distension.  Genitourinary: Vagina normal.  Musculoskeletal: Normal range of motion.  Neurological: She is alert and oriented to person, place, and time. She has normal reflexes.    Prenatal labs: ABO, Rh: A/POS/-- (01/09 1030) Antibody: NEG (01/09 1030) Rubella: 60.3 (01/09 1030) RPR: NON REAC (01/09 1030)  HBsAg: NEGATIVE (01/09 1030)  HIV:   collected GBS:  positive   Assessment: 1. Labor induction for Chronic HTN 2. Fetal Wellbeing: Category 1  3. Pain Control: I/V 4. GBS: positive on vancomycin per sensitivity and pt allergy 5. GTT: 1h 134.  6. 37.3  week IUP  Plan:  1. Admit to BS 2. Routine L&D orders and continue home medication for asthma and HTN 3. Magnesium sulfate and antihypertensives as needed 4. Vancomycin 5. Start on pitocin  6. Analgesia/anesthesia PRN     PILOTO, DAYARMYS 05/09/2011, 11:47 AM   Attestation of Attending Supervision of Resident: Evaluation and management procedures were performed by the [facility] Medicine Resident under my supervision.  I have reviewed the resident's note, chart reviewed and agree with management and plan.  Jaynie Collins, M.D. 05/09/2011 1:46 PM

## 2011-05-09 NOTE — Progress Notes (Signed)
Swelling in feet. Feels like she has some leaking.

## 2011-05-10 ENCOUNTER — Other Ambulatory Visit: Payer: Self-pay | Admitting: Obstetrics & Gynecology

## 2011-05-10 ENCOUNTER — Encounter (HOSPITAL_COMMUNITY): Payer: Self-pay | Admitting: Advanced Practice Midwife

## 2011-05-10 LAB — CBC
HCT: 34.1 % — ABNORMAL LOW (ref 36.0–46.0)
MCHC: 34 g/dL (ref 30.0–36.0)
RDW: 13.5 % (ref 11.5–15.5)

## 2011-05-10 MED ORDER — ALBUTEROL SULFATE (5 MG/ML) 0.5% IN NEBU
2.5000 mg | INHALATION_SOLUTION | RESPIRATORY_TRACT | Status: DC
  Administered 2011-05-10 – 2011-05-11 (×6): 2.5 mg via RESPIRATORY_TRACT
  Filled 2011-05-10 (×8): qty 0.5

## 2011-05-10 MED ORDER — ONDANSETRON HCL 4 MG/2ML IJ SOLN: 4.0000 mg | INTRAMUSCULAR | Status: DC | PRN

## 2011-05-10 MED ORDER — HYDROCHLOROTHIAZIDE 25 MG PO TABS
25.0000 mg | ORAL_TABLET | Freq: Every day | ORAL | Status: DC
  Administered 2011-05-10 – 2011-05-12 (×3): 25 mg via ORAL
  Filled 2011-05-10 (×3): qty 1

## 2011-05-10 MED ORDER — WITCH HAZEL-GLYCERIN EX PADS: 1.0000 "application " | MEDICATED_PAD | CUTANEOUS | Status: DC | PRN

## 2011-05-10 MED ORDER — LACTATED RINGERS IV SOLN
INTRAVENOUS | Status: DC
  Administered 2011-05-10 – 2011-05-11 (×2): via INTRAVENOUS
  Administered 2011-05-11: 100 mL/h via INTRAVENOUS

## 2011-05-10 MED ORDER — ALBUTEROL SULFATE (5 MG/ML) 0.5% IN NEBU
2.5000 mg | INHALATION_SOLUTION | RESPIRATORY_TRACT | Status: DC
  Administered 2011-05-10: 2.5 mg via RESPIRATORY_TRACT
  Filled 2011-05-10 (×3): qty 0.5

## 2011-05-10 MED ORDER — SENNOSIDES-DOCUSATE SODIUM 8.6-50 MG PO TABS
2.0000 | ORAL_TABLET | Freq: Every day | ORAL | Status: DC
  Administered 2011-05-10 – 2011-05-11 (×2): 2 via ORAL

## 2011-05-10 MED ORDER — ONDANSETRON HCL 4 MG PO TABS: 4.0000 mg | ORAL_TABLET | ORAL | Status: DC | PRN

## 2011-05-10 MED ORDER — OXYCODONE-ACETAMINOPHEN 5-325 MG PO TABS
1.0000 | ORAL_TABLET | ORAL | Status: DC | PRN
  Administered 2011-05-10 (×3): 1 via ORAL
  Administered 2011-05-11 (×2): 2 via ORAL
  Administered 2011-05-11: 1 via ORAL
  Administered 2011-05-12 (×3): 2 via ORAL
  Filled 2011-05-10 (×3): qty 2
  Filled 2011-05-10 (×4): qty 1
  Filled 2011-05-10 (×3): qty 2

## 2011-05-10 MED ORDER — DIBUCAINE 1 % RE OINT: 1.0000 "application " | TOPICAL_OINTMENT | RECTAL | Status: DC | PRN

## 2011-05-10 MED ORDER — BENZOCAINE-MENTHOL 20-0.5 % EX AERO: 1.0000 "application " | INHALATION_SPRAY | CUTANEOUS | Status: DC | PRN

## 2011-05-10 MED ORDER — LANOLIN HYDROUS EX OINT: TOPICAL_OINTMENT | CUTANEOUS | Status: DC | PRN

## 2011-05-10 MED ORDER — MAGNESIUM SULFATE 40 G IN LACTATED RINGERS - SIMPLE
2.0000 g/h | INTRAVENOUS | Status: DC
  Filled 2011-05-10: qty 500

## 2011-05-10 MED ORDER — TETANUS-DIPHTH-ACELL PERTUSSIS 5-2.5-18.5 LF-MCG/0.5 IM SUSP
0.5000 mL | Freq: Once | INTRAMUSCULAR | Status: AC
  Administered 2011-05-12: 0.5 mL via INTRAMUSCULAR
  Filled 2011-05-10 (×3): qty 0.5

## 2011-05-10 MED ORDER — MAGNESIUM SULFATE 40 G IN LACTATED RINGERS - SIMPLE: 2.0000 g/h | INTRAVENOUS | Status: DC

## 2011-05-10 MED ORDER — LABETALOL HCL 200 MG PO TABS
200.0000 mg | ORAL_TABLET | Freq: Two times a day (BID) | ORAL | Status: DC
  Administered 2011-05-11 – 2011-05-12 (×4): 200 mg via ORAL
  Filled 2011-05-10 (×4): qty 1

## 2011-05-10 MED ORDER — SODIUM BICARBONATE 8.4 % IV SOLN
INTRAVENOUS | Status: DC | PRN
  Administered 2011-05-10: 4 mL via EPIDURAL

## 2011-05-10 MED ORDER — PRENATAL MULTIVITAMIN CH
1.0000 | ORAL_TABLET | Freq: Every day | ORAL | Status: DC
  Administered 2011-05-12: 1 via ORAL
  Filled 2011-05-10 (×2): qty 1

## 2011-05-10 MED ORDER — AMLODIPINE BESYLATE 10 MG PO TABS
10.0000 mg | ORAL_TABLET | Freq: Every day | ORAL | Status: DC
  Filled 2011-05-10: qty 1

## 2011-05-10 MED ORDER — SIMETHICONE 80 MG PO CHEW
80.0000 mg | CHEWABLE_TABLET | ORAL | Status: DC | PRN
  Administered 2011-05-11 – 2011-05-12 (×2): 80 mg via ORAL

## 2011-05-10 MED ORDER — DIPHENHYDRAMINE HCL 25 MG PO CAPS: 25.0000 mg | ORAL_CAPSULE | Freq: Four times a day (QID) | ORAL | Status: DC | PRN

## 2011-05-10 MED ORDER — FENTANYL 2.5 MCG/ML BUPIVACAINE 1/10 % EPIDURAL INFUSION (WH - ANES)
INTRAMUSCULAR | Status: DC | PRN
  Administered 2011-05-10: 13 mL/h via EPIDURAL

## 2011-05-10 MED ORDER — ZOLPIDEM TARTRATE 5 MG PO TABS: 5.0000 mg | ORAL_TABLET | Freq: Every evening | ORAL | Status: DC | PRN

## 2011-05-10 NOTE — Anesthesia Procedure Notes (Signed)

## 2011-05-10 NOTE — Progress Notes (Signed)
UR chart review completed.  

## 2011-05-10 NOTE — Progress Notes (Signed)
Erica [name] is a 36 y.o. [phone] at [redacted]w[redacted]d admitted for induction of labor due to Hypertension.  Subjective: Just received epidural- getting comfortable.  Reports nausea, and breathing 'a little harder' and wheezing  Objective: BP 102/45   Pulse 78   Temp(Src) 97.7 F (36.5 C) (Oral)   Resp 20   Ht 5\' 6"  (1.676 m)   Wt 93.895 kg (207 lb)   BMI 33.41 kg/m2   SpO2 96% I/O last 3 completed shifts: In: 1884.1 [P.O.:650; I.V.:1034.1; IV Piggyback:200] Out: 1300 [Urine:1300] Total I/O In: 2568.5 [P.O.:1552; I.V.:1016.5] Out: 1575 [Urine:1575]  BP down to 83/36, phenylephrine administered by RN after consulting w/ anesthesiologist, and increased to 105/52 before dropping again to 80's/60's requiring additional phenylephrine .  Pt did not receive pre-epidural fluid bolus per anesthesiologist recommendation d/t HTN.   FHT:  FHR: 125 bpm, variability: moderate, periods of minimal,  accelerations:  Abscent,  decelerations:  Present mild variable UC:   irregular, every 2-6 minutes w/ MVUs 110-150 after epidural placement; UCs had been q 2-4 w/ MVUs 180-220 prior to placement SVE:   4 (with tight inner band)/80/-2, soft, slightly posterior  Scant/small amount light bloody amniotic fluid  Urinary catheter placed w/ scant/small amount clear yellow urine returned  Lungs: expiratory wheezing bilaterally w/ abdominal breathing; no crackles or s/s pulmonary edema noted  Labs: Lab Results  Component Value Date   WBC 6.8 05/10/2011   HGB 11.6* 05/10/2011   HCT 34.1* 05/10/2011   MCV 89.3 05/10/2011   PLT 168 05/10/2011    Assessment / Plan: IOL secondary to hypertension, progressing slowly on pitocin 2mu/min  Labor: Progressing slowly on pitocin, continue to increase to acheive adequate MVUs Preeclampsia:  on magnesium sulfate, no signs or symptoms of toxicity and no s/s pre-e Fetal Wellbeing:  Category II Pain Control:  Epidural I/D:  Vancomycin for gbs+ Anticipated MOD:  NSVD  RN calling RT for  breathing treatment  Reviewed w/ Cathie Beams, CNM  Joellyn Haff, SNM 05/10/2011, 1:46 AM

## 2011-05-10 NOTE — Progress Notes (Signed)
Visited with [name] after hearing the history of her pregnancy from her nurse.  She was in good spirits and was taking in the experience of giving birth to her son.  She declined any need for chaplain support at this time, though she is aware of available on-going support from the Spiritual Care Department.    Erica [name] Pager [phone] 9:44 AM   05/10/11 0900  Clinical Encounter Type  Visited With Patient and family together  Visit Type Initial  Referral From Nurse

## 2011-05-10 NOTE — Progress Notes (Signed)
Erica [name] is a 36 y.o. [phone] at [redacted]w[redacted]d admitted for induction of labor due to Hypertension.  Called by RN who states pt is feeling pressure.  Subjective: Comfortable w/ epidural, now feeling pelvic/rectal pressure.  Feels like needs another breathing treatment.  Objective: BP 138/41   Pulse 127   Temp(Src) 96.4 F (35.8 C) (Axillary)   Resp 20   Ht 5\' 6"  (1.676 m)   Wt 93.895 kg (207 lb)   BMI 33.41 kg/m2   SpO2 100% I/O last 3 completed shifts: In: 1884.1 [P.O.:650; I.V.:1034.1; IV Piggyback:200] Out: 1300 [Urine:1300] Total I/O In: 2716.6 [P.O.:1552; I.V.:1164.6] Out: 1625 [Urine:1625]  FHT:  FHR: 120 bpm, variability: minimal ,  accelerations:  Abscent,  decelerations:  Present earlies UC:   regular, every 2-4 minutes SVE: 10/100/1  Labs: Lab Results  Component Value Date   WBC 6.8 05/10/2011   HGB 11.6* 05/10/2011   HCT 34.1* 05/10/2011   MCV 89.3 05/10/2011   PLT 168 05/10/2011    Assessment / Plan: Induction of labor due to hypertension,  progressing well on pitocin, now at 3mu/min  Labor: Progressing normally, 10/100/1 Preeclampsia:  on magnesium sulfate, no signs or symptoms of toxicity and no s/s pre-e Fetal Wellbeing:  Category II Pain Control:  Epidural I/D:  Vancomycin for gbs+ Anticipated MOD:  NSVD  Too early to receive another breathing treatment, pt wants to use albuterol inhaler- rest briefly then begin pushing.  RN to call when needed for birth.  Joellyn Haff, SNM 05/10/2011, 3:00 AM

## 2011-05-10 NOTE — Consult Note (Signed)
Neonatology Note:   Attendance at Delivery:    I was asked to attend this NSVD at 37 4/7 weeks. The mother is a G5P3A1 A pos, GBS pos with chronic HTN (on multiple medications), asthma, and breast cancer (had several exposures to radiation during the pregnancy, which was not identified until about [redacted] weeks GA on a CT scan done for diagnostic reasons). She received Vancomycin > 4 hours prior to delivery and was afebrile during labor. She was also on magnesium sulfate, labetalol, and apresoline for elevated BP (not PIH), O2 therapy and nebulized treatments for severe asthma, and is somewhat glucose intolerant. She has also been on pain medications during the pregnancy. ROM 8 hours prior to delivery, fluid clear. Infant vigorous with good spontaneous cry and tone. Needed only minimal bulb suctioning. Ap 8/9. Lungs clear to ausc in DR. Appears full term, without resp distress or periodic breathing. To CN to care of Pediatrician.   Deatra James, MD

## 2011-05-10 NOTE — Anesthesia Preprocedure Evaluation (Signed)
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

## 2011-05-11 ENCOUNTER — Inpatient Hospital Stay (HOSPITAL_COMMUNITY): Payer: Self-pay | Admitting: Anesthesiology

## 2011-05-11 ENCOUNTER — Encounter (HOSPITAL_COMMUNITY)
Admission: AD | Disposition: A | Discharge status: Home / Self Care | Payer: Self-pay | Source: Ambulatory Visit | Attending: Obstetrics & Gynecology

## 2011-05-11 ENCOUNTER — Encounter (HOSPITAL_COMMUNITY): Payer: Self-pay | Admitting: Anesthesiology

## 2011-05-11 DIAGNOSIS — Z302 Encounter for sterilization: Secondary | ICD-10-CM

## 2011-05-11 DIAGNOSIS — I10 Essential (primary) hypertension: Secondary | ICD-10-CM

## 2011-05-11 DIAGNOSIS — O10019 Pre-existing essential hypertension complicating pregnancy, unspecified trimester: Secondary | ICD-10-CM

## 2011-05-11 HISTORY — PX: TUBAL LIGATION: SHX77

## 2011-05-11 LAB — CBC
Platelets: 167 10*3/uL (ref 150–400)
RDW: 13.6 % (ref 11.5–15.5)
WBC: 5.7 10*3/uL (ref 4.0–10.5)

## 2011-05-11 SURGERY — LIGATION, FALLOPIAN TUBE, POSTPARTUM
Anesthesia: Choice | Laterality: Bilateral

## 2011-05-11 MED ORDER — LACTATED RINGERS IV SOLN: INTRAVENOUS | Status: DC

## 2011-05-11 MED ORDER — ALBUTEROL SULFATE HFA 108 (90 BASE) MCG/ACT IN AERS: 2.0000 | INHALATION_SPRAY | Freq: Two times a day (BID) | RESPIRATORY_TRACT | Status: DC

## 2011-05-11 MED ORDER — FAMOTIDINE 20 MG PO TABS
40.0000 mg | ORAL_TABLET | Freq: Once | ORAL | Status: AC
  Administered 2011-05-11: 40 mg via ORAL
  Filled 2011-05-11: qty 2

## 2011-05-11 MED ORDER — CYCLOBENZAPRINE HCL 10 MG PO TABS
10.0000 mg | ORAL_TABLET | Freq: Three times a day (TID) | ORAL | Status: DC | PRN
  Administered 2011-05-11: 10 mg via ORAL
  Filled 2011-05-11 (×3): qty 1

## 2011-05-11 MED ORDER — GLUCOSE-VITAMIN C 4-6 GM-MG PO CHEW
CHEWABLE_TABLET | ORAL | Status: AC
  Filled 2011-05-11: qty 1

## 2011-05-11 MED ORDER — METOCLOPRAMIDE HCL 10 MG PO TABS
10.0000 mg | ORAL_TABLET | Freq: Once | ORAL | Status: AC
  Administered 2011-05-11: 10 mg via ORAL
  Filled 2011-05-11: qty 1

## 2011-05-11 MED ORDER — BUPIVACAINE HCL (PF) 0.25 % IJ SOLN
INTRAMUSCULAR | Status: AC
  Filled 2011-05-11: qty 30

## 2011-05-11 MED ORDER — LACTATED RINGERS IV SOLN
INTRAVENOUS | Status: DC | PRN
  Administered 2011-05-11: 11:00:00 via INTRAVENOUS

## 2011-05-11 MED ORDER — SODIUM BICARBONATE 8.4 % IV SOLN
INTRAVENOUS | Status: AC
  Filled 2011-05-11: qty 50

## 2011-05-11 MED ORDER — ALBUTEROL SULFATE HFA 108 (90 BASE) MCG/ACT IN AERS
2.0000 | INHALATION_SPRAY | RESPIRATORY_TRACT | Status: DC | PRN
  Administered 2011-05-11 (×2): 2 via RESPIRATORY_TRACT

## 2011-05-11 MED ORDER — BUPIVACAINE-EPINEPHRINE (PF) 0.5% -1:200000 IJ SOLN
INTRAMUSCULAR | Status: AC
  Filled 2011-05-11: qty 10

## 2011-05-11 MED ORDER — FENTANYL CITRATE 0.05 MG/ML IJ SOLN
INTRAMUSCULAR | Status: AC
  Filled 2011-05-11: qty 2

## 2011-05-11 MED ORDER — FENTANYL CITRATE 0.05 MG/ML IJ SOLN: 25.0000 ug | INTRAMUSCULAR | Status: DC | PRN

## 2011-05-11 MED ORDER — BUPIVACAINE HCL (PF) 0.25 % IJ SOLN
INTRAMUSCULAR | Status: DC | PRN
  Administered 2011-05-11: 9 mL

## 2011-05-11 MED ORDER — FENTANYL CITRATE 0.05 MG/ML IJ SOLN
INTRAMUSCULAR | Status: DC | PRN
  Administered 2011-05-11: 50 ug via INTRAVENOUS

## 2011-05-11 MED ORDER — MIDAZOLAM HCL 2 MG/2ML IJ SOLN
INTRAMUSCULAR | Status: AC
  Filled 2011-05-11: qty 2

## 2011-05-11 MED ORDER — LIDOCAINE-EPINEPHRINE (PF) 2 %-1:200000 IJ SOLN
INTRAMUSCULAR | Status: AC
  Filled 2011-05-11: qty 20

## 2011-05-11 MED ORDER — MIDAZOLAM HCL 5 MG/5ML IJ SOLN
INTRAMUSCULAR | Status: DC | PRN
  Administered 2011-05-11: 2 mg via INTRAVENOUS

## 2011-05-11 SURGICAL SUPPLY — 23 items
BLADE SURG 11 STRL SS (BLADE) ×2 IMPLANT
CHLORAPREP W/TINT 26ML (MISCELLANEOUS) ×2 IMPLANT
CLIP FILSHIE TUBAL LIGA STRL (Clip) ×2 IMPLANT
CLOTH BEACON ORANGE TIMEOUT ST (SAFETY) ×2 IMPLANT
DRSG COVADERM PLUS 2X2 (GAUZE/BANDAGES/DRESSINGS) ×2 IMPLANT
GLOVE BIO SURGEON STRL SZ 6.5 (GLOVE) ×2 IMPLANT
GLOVE BIOGEL PI IND STRL 7.0 (GLOVE) ×2 IMPLANT
GLOVE BIOGEL PI IND STRL 7.5 (GLOVE) ×1 IMPLANT
GLOVE BIOGEL PI INDICATOR 7.0 (GLOVE) ×2
GLOVE BIOGEL PI INDICATOR 7.5 (GLOVE) ×1
GLOVE SURG SS PI 7.5 STRL IVOR (GLOVE) ×2 IMPLANT
GOWN PREVENTION PLUS LG XLONG (DISPOSABLE) ×4 IMPLANT
NEEDLE HYPO 25X1 1.5 SAFETY (NEEDLE) ×2 IMPLANT
NS IRRIG 1000ML POUR BTL (IV SOLUTION) ×2 IMPLANT
PACK ABDOMINAL MINOR (CUSTOM PROCEDURE TRAY) ×2 IMPLANT
SPONGE LAP 4X18 X RAY DECT (DISPOSABLE) ×2 IMPLANT
SUT VIC AB 0 CT1 27 (SUTURE) ×1
SUT VIC AB 0 CT1 27XBRD ANBCTR (SUTURE) ×1 IMPLANT
SUT VICRYL 4-0 PS2 18IN ABS (SUTURE) ×2 IMPLANT
SYR CONTROL 10ML LL (SYRINGE) ×2 IMPLANT
TOWEL OR 17X24 6PK STRL BLUE (TOWEL DISPOSABLE) ×4 IMPLANT
TRAY FOLEY BAG SILVER LF 14FR (CATHETERS) ×2 IMPLANT
WATER STERILE IRR 1000ML POUR (IV SOLUTION) ×2 IMPLANT

## 2011-05-11 NOTE — Transfer of Care (Signed)
Immediate Anesthesia Transfer of Care Note  Patient: Erica [name]  Procedure(s) Performed:  POST PARTUM TUBAL LIGATION - Induced for HTN  Patient Location: PACU  Anesthesia Type: Epidural  Level of Consciousness: awake, alert  and oriented  Airway & Oxygen Therapy: Patient Spontanous Breathing and Patient connected to nasal cannula oxygen  Post-op Assessment: Report given to PACU RN, Post -op Vital signs reviewed and stable and Patient moving all extremities  Post vital signs: Reviewed and stable  Complications: No apparent anesthesia complications

## 2011-05-11 NOTE — Progress Notes (Signed)
Pt transferred to the OR via stretcher for a scheduled PPTL.  #18g R) forearm PIV infusing with LR. Pt reports improvement of her R) lower back pain since having Flexeril at 0952. Epidural remains intact and tapped to R) shoulder. VSS upon leaving AICU and pt denies headache, visual changes or epigastric pain. Reported to OR staff re: No BP's, IV sticks to LUE.

## 2011-05-11 NOTE — Anesthesia Postprocedure Evaluation (Signed)
  Anesthesia Post-op Note  Patient: Erica [name]  Procedure(s) Performed:  POST PARTUM TUBAL LIGATION - Induced for HTN  Patient Location: Mother/Baby  Anesthesia Type: Epidural  Level of Consciousness: awake, alert  and oriented  Airway and Oxygen Therapy: Patient Spontanous Breathing  Post-op Pain: mild  Post-op Assessment: Patient's Cardiovascular Status Stable, Respiratory Function Stable, Patent Airway, No signs of Nausea or vomiting and Pain level controlled  Post-op Vital Signs: stable  Complications: No apparent anesthesia complications

## 2011-05-11 NOTE — Addendum Note (Signed)
Addendum  created 05/11/11 1508 by Lincoln Brigham, CRNA   Modules edited:Notes Section

## 2011-05-11 NOTE — Anesthesia Postprocedure Evaluation (Signed)
Anesthesia Post Note  Patient: Erica [name]  Procedure(s) Performed:  POST PARTUM TUBAL LIGATION - Induced for HTN  Anesthesia type: Epidural  Patient location: PACU  Post pain: Pain level controlled  Post assessment: Post-op Vital signs reviewed  Last Vitals:  Filed Vitals:   05/11/11 1245  BP:   Pulse: 80  Temp:   Resp: 19    Post vital signs: stable  Level of consciousness: awake  Complications: No apparent anesthesia complications

## 2011-05-11 NOTE — Progress Notes (Signed)
Post Partum Day 1 Subjective: up ad lib, voiding and npo for PP BTL.  Right sided low back pain.  Objective: Blood pressure 131/78, pulse 87, temperature 98 F (36.7 C), temperature source Oral, resp. rate 21, height 5\' 6"  (1.676 m), weight 94.394 kg (208 lb 1.6 oz), SpO2 97.00%, unknown if currently breastfeeding.  Physical Exam:  General: alert, cooperative and no distress Heart: regular rate, no murmur Lungs: clear to auscultation bilaterally, no wheezing. Lochia: appropriate Uterine Fundus: firm DVT Evaluation: No evidence of DVT seen on physical exam. Musk: hypertonic right paraspinals and quadratus laborum.   Basename 05/10/11 0005 05/09/11 1005  HGB 11.6* 11.6*  HCT 34.1* 33.9*    Assessment/Plan: Plan for discharge tomorrow.  Flexeril for back pain.  BTL later today.     LOS: 2 days   Morayo Leven JEHIEL 05/11/2011, 7:09 AM

## 2011-05-11 NOTE — Op Note (Signed)
AZARIE CORIZ 05/09/2011 - 05/11/2011  PREOPERATIVE DIAGNOSIS:  Multiparity, undesired fertility  POSTOPERATIVE DIAGNOSIS:  Multiparity, undesired fertility  PROCEDURE:  Postpartum Bilateral Tubal Sterilization using Filshie Clips   ANESTHESIA:  Epidural  COMPLICATIONS:  None immediate.  ESTIMATED BLOOD LOSS:  Less than 20 ml.  FLUIDS: 500 ml LR.  URINE OUTPUT:  30 ml of clear urine.  INDICATIONS: 36 y.o. [id number]  with undesired fertility,status post vaginal delivery, desires permanent sterilization. Risks and benefits of procedure discussed with patient including permanence of method, bleeding, infection, injury to surrounding organs and need for additional procedures. Risk failure of 0.5-1% with increased risk of ectopic gestation if pregnancy occurs was also discussed with patient.   FINDINGS:  Normal uterus, tubes, and ovaries.  TECHNIQUE:  The patient was taken to the operating room where her epidural anesthesia was dosed up to surgical level and found to be adequate. 0.5% Marcaine 9ml was infiltrated.  She was then placed in the dorsal supine position and prepped and draped in sterile fashion.  After an adequate timeout was performed, attention was turned to the patient's abdomen where a small transverse skin incision was made under the umbilical fold. The incision was taken down to the layer of fascia using the scalpel, and fascia was incised, and extended bilaterally using Mayo scissors. The peritoneum was entered in a sharp fashion. Attention was then turned to the patient's uterus, and left fallopian tube was identified and followed out to the fimbriated end.  A Filshie clip was placed on the left fallopian tube about 2 cm from the cornual attachment, with care given to incorporate the underlying mesosalpinx.  A similar process was carried out on the rightl side allowing for bilateral tubal sterilization.  Good hemostasis was noted overall. The instruments were then removed from the  patient's abdomen and the fascial incision was repaired with 0 Vicryl, and the skin was closed with a 4-0 Vicryl  subcuticular stitch. The patient tolerated the procedure well.  Sponge, lap, and needle counts were correct times two.  The patient was then taken to the recovery room awake and in stable condition.  Trevante Tennell 05/11/11

## 2011-05-11 NOTE — Progress Notes (Signed)
Patient ID: Erica [name], female   DOB: Jan 02, 1976, 36 y.o.   MRN: [id number] Patient is scheduled for ppBTL. The procedure and the risk of failure, ectopic, bleeding, infection, bowel and urinary tract damage and anesthesia problem were discussed and her questions were answered. Consent was signed. ARNOLD,JAMES 05/11/11

## 2011-05-12 MED ORDER — HYDROCHLOROTHIAZIDE 25 MG PO TABS: 25.0000 mg | ORAL_TABLET | Freq: Every day | ORAL | Status: DC

## 2011-05-12 MED ORDER — OXYCODONE-ACETAMINOPHEN 5-325 MG PO TABS: 1.0000 | ORAL_TABLET | ORAL | Status: AC | PRN

## 2011-05-12 MED ORDER — LABETALOL HCL 200 MG PO TABS: 200.0000 mg | ORAL_TABLET | Freq: Two times a day (BID) | ORAL | Status: DC

## 2011-05-12 MED ORDER — BISACODYL 10 MG RE SUPP
10.0000 mg | Freq: Once | RECTAL | Status: DC
  Filled 2011-05-12: qty 1

## 2011-05-12 MED ORDER — CYCLOBENZAPRINE HCL 10 MG PO TABS: 10.0000 mg | ORAL_TABLET | Freq: Three times a day (TID) | ORAL | Status: AC | PRN

## 2011-05-12 MED ORDER — LANOLIN HYDROUS EX OINT: 1.0000 "application " | TOPICAL_OINTMENT | CUTANEOUS | Status: DC | PRN

## 2011-05-12 NOTE — Discharge Summary (Signed)
Obstetric Discharge Summary Reason for Admission: induction of labor for severe range hypertension Prenatal Procedures: NST Intrapartum Procedures: spontaneous vaginal delivery Postpartum Procedures: P.P. tubal ligation Complications-Operative and Postpartum: none HGB  Date Value Range Status  02/28/2011 11.6  11.6-15.9 (g/dL) Final     Hemoglobin  Date Value Range Status  05/11/2011 10.6* 12.0-15.0 (g/dL) Final     HCT  Date Value Range Status  05/11/2011 31.8* 36.0-46.0 (%) Final  02/28/2011 33.6* 34.8-46.6 (%) Final  Hospital Course:  Patient was admitted for IOL from clinic due to severe range hypertension. She had some issues with wheezing which required breathing treatments.  She was placed on Magnesium sulfate and both Labetolol and Apresoline were used for hypertension control.  Pitocin was used and she progressed well to SVD over intact perineum.  She had requested a BTL which was performed yesterday without incident. She has done well and is deemed to have received the full benefit of her hospital stay and is discharged home.   Filed Vitals:   05/11/11 2214  BP: 155/80  Pulse: 87  Temp: 98 F (36.7 C)  Resp: 20  PE:  Chest CTAB, HR RRR         Abdomen soft and appropriately tender, incision CD&I         Lochia WNL         Ext WNL, neg Homans   Discharge Diagnoses: Term Pregnancy-delivered and Preelampsia  Discharge Information: Date: 05/12/2011 Activity: unrestricted and pelvic rest Diet: routine Medications: Ibuprofen, Norvasc, HCTZ Condition: stable Instructions: refer to practice specific booklet Discharge to: home Follow-up Information    Follow up with [facility]. Schedule an appointment as soon as possible for a visit in 4 weeks.         Newborn Data: Live born female  Birth Weight: 6 lb 12.5 oz (3075 g) APGAR: 9, 9  Will have [facility] [employer] nurse check BP at home. Return to Clinic in 2 weeks  Baby Home with mother. Will have her followup  with her primary doctor.   Wynelle Bourgeois 05/12/2011, 2:12 AM

## 2011-05-13 ENCOUNTER — Other Ambulatory Visit: Payer: Self-pay

## 2011-05-14 ENCOUNTER — Encounter (HOSPITAL_COMMUNITY): Payer: Self-pay | Admitting: Obstetrics & Gynecology

## 2011-05-23 ENCOUNTER — Other Ambulatory Visit: Payer: Self-pay

## 2011-05-30 ENCOUNTER — Ambulatory Visit (HOSPITAL_BASED_OUTPATIENT_CLINIC_OR_DEPARTMENT_OTHER): Payer: Self-pay | Admitting: Lab

## 2011-05-30 ENCOUNTER — Encounter: Payer: Self-pay | Admitting: Physician Assistant

## 2011-05-30 ENCOUNTER — Telehealth: Payer: Self-pay | Admitting: *Deleted

## 2011-05-30 ENCOUNTER — Ambulatory Visit (HOSPITAL_BASED_OUTPATIENT_CLINIC_OR_DEPARTMENT_OTHER): Payer: Self-pay | Admitting: Physician Assistant

## 2011-05-30 VITALS — BP 191/111 | HR 102 | Temp 98.4°F | Ht 66.0 in | Wt 190.7 lb

## 2011-05-30 DIAGNOSIS — J45909 Unspecified asthma, uncomplicated: Secondary | ICD-10-CM

## 2011-05-30 DIAGNOSIS — Z853 Personal history of malignant neoplasm of breast: Secondary | ICD-10-CM

## 2011-05-30 DIAGNOSIS — I10 Essential (primary) hypertension: Secondary | ICD-10-CM

## 2011-05-30 DIAGNOSIS — C50919 Malignant neoplasm of unspecified site of unspecified female breast: Secondary | ICD-10-CM

## 2011-05-30 LAB — COMPREHENSIVE METABOLIC PANEL
ALT: 66 U/L — ABNORMAL HIGH (ref 0–35)
AST: 45 U/L — ABNORMAL HIGH (ref 0–37)
Albumin: 3.7 g/dL (ref 3.5–5.2)
BUN: 6 mg/dL (ref 6–23)
CO2: 25 mEq/L (ref 19–32)
Calcium: 9 mg/dL (ref 8.4–10.5)
Chloride: 101 mEq/L (ref 96–112)
Potassium: 3.3 mEq/L — ABNORMAL LOW (ref 3.5–5.3)

## 2011-05-30 LAB — CANCER ANTIGEN 27.29: CA 27.29: 11 U/mL (ref 0–39)

## 2011-05-30 LAB — CBC WITH DIFFERENTIAL/PLATELET
Basophils Absolute: 0 10*3/uL (ref 0.0–0.1)
Eosinophils Absolute: 0.1 10*3/uL (ref 0.0–0.5)
HCT: 41.2 % (ref 34.8–46.6)
HGB: 14.5 g/dL (ref 11.6–15.9)
LYMPH%: 38 % (ref 14.0–49.7)
MONO#: 0.3 10*3/uL (ref 0.1–0.9)
NEUT#: 2.5 10*3/uL (ref 1.5–6.5)
Platelets: 257 10*3/uL (ref 145–400)
RBC: 4.82 10*6/uL (ref 3.70–5.45)
WBC: 4.7 10*3/uL (ref 3.9–10.3)
nRBC: 0 % (ref 0–0)

## 2011-05-30 NOTE — Progress Notes (Signed)
Hematology and Oncology Follow Up Visit  Erica [name] [id number] [birth year] 36 y.o. 02/28/11  Interim History:   The patient returns today for routine followup of her left breast carcinoma. Interim history is remarkable for the patient having given birth to a healthy baby boy 3 weeks ago!  Erica [name] was last seen here in late November. She had  multiple negative pregnancy tests and was complaining of several GI complaints including increased constipation, abdominal pain and increased reflux. We referred her at that time to Dr. Rhea Belton for GI evaluation, and also referred her to [facility] for routine pelvic and Pap secondary to amenorrhea. Subsequent KUB ordered by GI confirmed a 34 week pregnancy. She has multiple comorbidities including poorly controlled hypertension and chronic asthma and these continue to be followed closely by her other physicians.  With regards to her breast cancer, Erica [name] is doing quite well. She really has very few complaints today other than fatigue and continued chronic pain affecting the left chest wall and left axillary region.  She's had no fevers, chills, or night sweats. She has less reflux and has had no recent nausea. Her bowels are regular. She continues to have shortness of breath associated with her asthma.   A detailed review of systems is otherwise noncontributory as noted below.  Review of Systems: Constitutional: fatigue,  no weight loss, fever, night sweats Eyes: negative [id number] Cardiovascular: positive for - dyspnea on exertion negative for - chest pain, edema or palpitations Respiratory: positive for - cough and shortness of breath negative for - hemoptysis or orthopnea Neurological: negative Dermatological: negative Gastrointestinal:negative for - nausea/vomiting, changein bowels, dark/tarry stools,  or swallowing difficulty/pain Genito-Urinary: no dysuria, trouble voiding, or hematuria Hematological and Lymphatic: negative Breast:  negative Musculoskeletal: chronic pain in left chest wall and left axilla and upper extremity Remaining ROS negative.  Medications: I have reviewed the patient's current medications.  Allergies:  Allergies  Allergen Reactions  . Aspirin Shortness Of Breath    Pt can take ibuprofen without reaction  . Penicillins Shortness Of Breath     Physical Exam:  Blood pressure 191/111, pulse 102, temperature 98.4 F (36.9 C), height 5\' 6"  (1.676 m), weight 190 lb 11.2 oz (86.501 kg), unknown if currently breastfeeding. HEENT:  Sclerae anicteric, conjunctivae pink.  Oropharynx clear.  No mucositis or candidiasis.  Nodes:  No cervical, supraclavicular, or axillary lymphadenopathy palpated.  Breast Exam:  Right breast is benign.  No masses, discharge, skin change, or nipple inversion.  Left breast tender to palpation. No unusual masses skin changes or evidence of local recurrence.  Lungs:  Clear to auscultation bilaterally.  No crackles or rhonchi. Heart:  Regular rate and rhythm.  Abdomen:  Soft, obese, nontender.  Positive bowel sounds.  No organomegaly or masses palpated.  Musculoskeletal:  No focal spinal tenderness to palpation.  Extremities:  Benign.  No peripheral edema or cyanosis.  Skin:  Benign.  Neuro:  Nonfocal.   Lab Results: Lab Results  Component Value Date   WBC 5.7 05/11/2011   HGB 10.6* 05/11/2011   HCT 31.8* 05/11/2011   MCV 90.9 05/11/2011   PLT 167 05/11/2011   NEUTROABS 4.5 04/10/2011     Chemistry      Component Value Date/Time   NA 134* 05/09/2011 1005   K 3.7 05/09/2011 1005   CL 103 05/09/2011 1005   CO2 20 05/09/2011 1005   BUN 5* 05/09/2011 1005   CREATININE 0.60 05/09/2011 1005   CREATININE 0.64 05/02/2011 1021  Component Value Date/Time   CALCIUM 9.1 05/09/2011 1005   ALKPHOS 68 05/09/2011 1005   AST 15 05/09/2011 1005   ALT 13 05/09/2011 1005   BILITOT 0.2* 05/09/2011 1005      A repeat CBC, CMET and CA27.29 are pending today.   Impression and Plan: 36 year-old [place]  woman with a history of locally advanced left breast carcinoma, initially presenting in March 2010. Treated neoadjuvant weight with 4 cycles of docetaxel and cyclophosphamide. 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 Status post Ferrol axillary dissection, with margin clearance in August 2010. Then received 11 doses of weekly paclitaxel, completed December of 2010 and followed by radiation therapy, completed March 2011. Now followed with observation alone, no evidence of recurrence thus far. Also with comorbidities including poorly controlled hypertension and chronic asthma.  With regards to her breast cancer, the patient continues to do well, and there is no clinical evidence of disease recurrence at this time. We'll continue to see her for routine labs and followup every 3-4 months, her next appointment here being in June. Prior to that visit, she scheduled for her next bilateral mammogram in May 2013.  This plan was reviewed with the patient, who voices understanding and agreement.  She knows to call with any changes or problems.    Zollie Scale, PA-C 2/28/20134:01 PM

## 2011-05-30 NOTE — Telephone Encounter (Signed)
patient will go threw the b-cep program gave patient Erica [name] walden's phone number at [phone] to get the patient enrolled

## 2011-05-30 NOTE — Telephone Encounter (Signed)
gave patient appointment for 08-2011 and gave patient appointment for mammogram at the breast center

## 2011-06-19 ENCOUNTER — Ambulatory Visit (INDEPENDENT_AMBULATORY_CARE_PROVIDER_SITE_OTHER): Payer: Self-pay | Admitting: Advanced Practice Midwife

## 2011-06-19 ENCOUNTER — Other Ambulatory Visit: Payer: Self-pay | Admitting: Oncology

## 2011-06-19 ENCOUNTER — Encounter: Payer: Self-pay | Admitting: Advanced Practice Midwife

## 2011-06-19 NOTE — Progress Notes (Addendum)
  Subjective:    Patient ID: Erica [name], female    DOB: [birth year], 36 y.o.   MRN: [id number]  HPI Patient presents for 6 week postpartum follow up from NSVD with no compliants.  She is bottle feeding due to minimal breast milk production.  She had BTL on 05/11/11 for family planning.  She has h/o HTN, breast cancer, asthma, and GERD which she feels are all managed well at this time.  She denies pain, fever, HA, chest pain, palpitations, SOB, or LE edema.  Review of Systems  Constitutional: Negative for fever.  Respiratory: Negative for shortness of breath.   Cardiovascular: Negative for chest pain, palpitations and leg swelling.  Neurological: Negative for headaches.       Objective:   Physical Exam  Constitutional: She is oriented to person, place, and time. She appears well-developed and well-nourished. No distress.  HENT:  Head: Normocephalic and atraumatic.  Neck: Neck supple.  Pulmonary/Chest: Effort normal.  Genitourinary: Vagina normal.  Musculoskeletal: Normal range of motion. She exhibits no edema.  Neurological: She is alert and oriented to person, place, and time.  Skin: Skin is warm. No rash noted. No erythema. No pallor.  Cervix: Closed.  No tenderness with palpation of cervix or adnexa.  No masses noted. Uterus: Normal size.  No tenderness or masses noted.     Assessment & Plan:  36 yo [id number] presenting for postpartum care.  Agree with above.  Wynelle Bourgeois CNM

## 2011-06-20 ENCOUNTER — Other Ambulatory Visit: Payer: Self-pay | Admitting: Physician Assistant

## 2011-06-20 ENCOUNTER — Telehealth: Payer: Self-pay | Admitting: Oncology

## 2011-06-20 DIAGNOSIS — C50919 Malignant neoplasm of unspecified site of unspecified female breast: Secondary | ICD-10-CM

## 2011-06-20 NOTE — Telephone Encounter (Signed)
lmonvm advising the pt of her lab appt on 06/27/2011

## 2011-06-27 ENCOUNTER — Other Ambulatory Visit: Payer: Self-pay | Admitting: Lab

## 2011-07-03 ENCOUNTER — Other Ambulatory Visit: Payer: Self-pay | Admitting: Physician Assistant

## 2011-07-03 ENCOUNTER — Telehealth: Payer: Self-pay | Admitting: *Deleted

## 2011-07-03 NOTE — Telephone Encounter (Signed)
left voice message to inform the patient of the appointment for lab only on 07-05-2011.

## 2011-07-04 ENCOUNTER — Other Ambulatory Visit: Payer: Self-pay | Admitting: Physician Assistant

## 2011-07-04 DIAGNOSIS — C50919 Malignant neoplasm of unspecified site of unspecified female breast: Secondary | ICD-10-CM

## 2011-07-05 ENCOUNTER — Other Ambulatory Visit: Payer: Self-pay | Admitting: Lab

## 2011-08-01 ENCOUNTER — Encounter (INDEPENDENT_AMBULATORY_CARE_PROVIDER_SITE_OTHER): Payer: Self-pay | Admitting: General Surgery

## 2011-08-01 ENCOUNTER — Ambulatory Visit (INDEPENDENT_AMBULATORY_CARE_PROVIDER_SITE_OTHER): Payer: Self-pay | Admitting: General Surgery

## 2011-08-01 VITALS — BP 152/100 | HR 88 | Temp 97.5°F | Resp 14 | Ht 66.0 in | Wt 193.0 lb

## 2011-08-01 DIAGNOSIS — L732 Hidradenitis suppurativa: Secondary | ICD-10-CM

## 2011-08-01 HISTORY — DX: Hidradenitis suppurativa: L73.2

## 2011-08-01 MED ORDER — DOXYCYCLINE HYCLATE 100 MG PO TABS: 100.0000 mg | ORAL_TABLET | Freq: Two times a day (BID) | ORAL | Status: AC

## 2011-08-01 MED ORDER — OXYCODONE-ACETAMINOPHEN 5-325 MG PO TABS: 1.0000 | ORAL_TABLET | Freq: Four times a day (QID) | ORAL | Status: DC | PRN

## 2011-08-01 NOTE — Patient Instructions (Signed)
Abscess Care After An abscess (also called a boil or furuncle) is an infected area that contains a collection of pus. Signs and symptoms of an abscess include pain, tenderness, redness, or hardness, or you may feel a moveable soft area under your skin. An abscess can occur anywhere in the body. The infection may spread to surrounding tissues causing cellulitis. A cut (incision) by the surgeon was made over your abscess and the pus was drained out. Gauze may have been packed into the space to provide a drain that will allow the cavity to heal from the inside outwards. The boil may be painful for 5 to 7 days. Most people with a boil do not have high fevers. Your abscess, if seen early, may not have localized, and may not have been lanced. If not, another appointment may be required for this if it does not get better on its own or with medications.  HOME CARE INSTRUCTIONS   Only take over-the-counter or prescription medicines for pain, discomfort, or fever as directed by your caregiver.   When you bathe, soak and then remove gauze or iodoform packs. You may then wash the wound gently with mild soapy water. Cover with gauze or do as your caregiver directs.   SEEK IMMEDIATE MEDICAL CARE IF:   You develop increased pain, swelling, redness, drainage, or bleeding in the wound site.   You develop signs of generalized infection including muscle aches, chills, fever, or a general ill feeling.   An oral temperature above 102 F (38.9 C) develops, not controlled by medication.  See your caregiver for a recheck if you develop any of the symptoms described above. If medications (antibiotics) were prescribed, take them as directed.   

## 2011-08-01 NOTE — Progress Notes (Signed)
Subjective:     Patient ID: ALAISA [name], female   DOB: [birth year], 35 y.o.   MRN: [id number]  HPI  36 year old African American female with history of breast cancer and bilateral axillary hidradenitis who is a patient of Dr. Luisa Hart comes in because of right axillary pain. She states that she developed a bump in her right axilla several days ago. She states that area spontaneously ruptured yesterday. It drained a large amount of brown fluid. She denies any fevers or chills. She states the area still very tender. She states that Dr. Luisa Hart has talked in the past about hydradenitis surgeryReview of Systems     Objective:   Physical Exam BP 152/100  Pulse 88  Temp 97.5 F (36.4 C)  Resp 14  Ht 5\' 6"  (1.676 m)  Wt 193 lb (87.544 kg)  BMI 31.15 kg/m2 AAF in nad. Grimaces when rt axilla palpated Rt axilla - small area of skin opening with purulent fluid. Fair amount of induration about 1.5 inches inferior to skin opening. No cellulitis. No fluctuance. Left axilla - ok    Assessment:     Infected Right axillary hidradenitis     Plan:     Since there is fair amount of induration, I recommended incision and drainage in the office today. After obtaining verbal consent, the area was prepped with ChloraPrep. Then 2% Xylocaine with epinephrine was infiltrated into the area. I then enlarged the skin opening with a #15 blade. A 1-1/2 inch incision was made. The area was then opened with a hemostat. A quarter inch iodoform gauze was packed in the wound. Dry gauze was placed on top. The patient tolerated the procedure.  I gave her a prescription for doxycycline and Percocet. She was given wound care instructions. She was also given instructions on what to call for.  Followup Dr. Luisa Hart for 2 weeks  Mary Sella. Andrey Campanile, MD, FACS General, Bariatric, & Minimally Invasive Surgery [facility], [place]

## 2011-08-04 ENCOUNTER — Emergency Department (HOSPITAL_COMMUNITY)
Admission: EM | Admit: 2011-08-04 | Discharge: 2011-08-04 | Disposition: A | Discharge status: Home / Self Care | Payer: Self-pay | Attending: Emergency Medicine | Admitting: Emergency Medicine

## 2011-08-04 ENCOUNTER — Encounter (HOSPITAL_COMMUNITY): Payer: Self-pay | Admitting: Emergency Medicine

## 2011-08-04 DIAGNOSIS — M25539 Pain in unspecified wrist: Secondary | ICD-10-CM | POA: Insufficient documentation

## 2011-08-04 DIAGNOSIS — M779 Enthesopathy, unspecified: Secondary | ICD-10-CM

## 2011-08-04 DIAGNOSIS — M65839 Other synovitis and tenosynovitis, unspecified forearm: Secondary | ICD-10-CM | POA: Insufficient documentation

## 2011-08-04 DIAGNOSIS — K219 Gastro-esophageal reflux disease without esophagitis: Secondary | ICD-10-CM | POA: Insufficient documentation

## 2011-08-04 DIAGNOSIS — I10 Essential (primary) hypertension: Secondary | ICD-10-CM | POA: Insufficient documentation

## 2011-08-04 DIAGNOSIS — L0291 Cutaneous abscess, unspecified: Secondary | ICD-10-CM

## 2011-08-04 DIAGNOSIS — J45909 Unspecified asthma, uncomplicated: Secondary | ICD-10-CM | POA: Insufficient documentation

## 2011-08-04 DIAGNOSIS — IMO0002 Reserved for concepts with insufficient information to code with codable children: Secondary | ICD-10-CM | POA: Insufficient documentation

## 2011-08-04 HISTORY — DX: Chronic lymphadenitis, except mesenteric: I88.1

## 2011-08-04 NOTE — ED Provider Notes (Signed)
History     CSN: [id number]  Arrival date & time 08/04/11  0103   First MD Initiated Contact with Patient 08/04/11 0325     4:49 AM HPI Patient reports I&D of abscess 2 days ago. States home health was removing packing stated she still had a "hard section"recommended to come to the emergency department for recheck to see if she needed another I & D. patient reports she is currently still taking antibiotics. Patient also reports pain in her left radial side of the wrist. Reports pain has been progressive without injury. States worse with picking things up. In points to pain located at the first MTP. No erythema, mass. Reports she is left-handed but does feel that she has been overusing her hand since she is a new 2-1/66-month-old.  HPI  Past Medical History  Diagnosis Date  . Breast cancer 2010  . Asthma   . Hypertension   . GERD (gastroesophageal reflux disease)   . Impaired glucose tolerance 04/13/2011  . Anemia, unspecified 04/13/2011  . Asthma 09/27/2010  . Bloating 03/18/2011  . 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 Lymphadenitis, chronic     restricted LEFT extremity    Past Surgical History  Procedure Date  . Hand surgery   . Breast lumpectomy 09/2008  . Carpal tunnel release     left hand  . Tubal ligation 05/11/2011    Procedure: POST PARTUM TUBAL LIGATION;  Surgeon: Scheryl Darter, MD;  Location: [facility];  Service: Gynecology;  Laterality: Bilateral;  Induced for HTN    Family History  Problem Relation Age of Onset  . Diabetes Maternal Grandmother   . Heart disease Maternal Grandmother     History  Substance Use Topics  . Smoking status: Never Smoker   . Smokeless tobacco: Never Used  . Alcohol Use: No    OB History    Grav Para Term Preterm Abortions TAB SAB Ect Mult Living   6 4 4  1  1   4       Review of Systems  Constitutional: Negative for fever and chills.  HENT: Negative for rhinorrhea and neck pain.   Eyes: Negative for redness.  Respiratory: Negative  for cough, shortness of breath and wheezing.   Cardiovascular: Negative for chest pain and palpitations.  Gastrointestinal: Negative for nausea, vomiting and abdominal pain.  Musculoskeletal: Negative for back pain.       Wrist pain  Skin: Negative for wound.       Abscess  Neurological: Negative for weakness and numbness.  All other systems reviewed and are negative.    Allergies  Aspirin and Penicillins  Home Medications   Current Outpatient Rx  Name Route Sig Dispense Refill  . ALBUTEROL SULFATE (2.5 MG/3ML) 0.083% IN NEBU Nebulization Take 2.5 mg by nebulization every 4 (four) hours as needed.      Marland Kitchen AMLODIPINE BESYLATE 10 MG PO TABS Oral Take 1 tablet (10 mg total) by mouth daily. 30 tablet 1  . DOCUSATE SODIUM 100 MG PO CAPS Oral Take 1 capsule (100 mg total) by mouth daily as needed for constipation. 30 capsule 1  . DOXYCYCLINE HYCLATE 100 MG PO TABS Oral Take 1 tablet (100 mg total) by mouth 2 (two) times daily. 14 tablet 0  . FLUTICASONE-SALMETEROL 250-50 MCG/DOSE IN AEPB Inhalation Inhale 1 puff into the lungs every 12 (twelve) hours.      Marland Kitchen HYDROCHLOROTHIAZIDE 25 MG PO TABS Oral Take 1 tablet (25 mg total) by mouth daily. 20 tablet  0  . IPRATROPIUM BROMIDE 0.02 % IN SOLN Nebulization Take 500 mcg by nebulization 2 (two) times daily as needed. For shortness of breath    . LABETALOL HCL 200 MG PO TABS Oral Take 400 mg by mouth 2 (two) times daily.    . MOMETASONE FURO-FORMOTEROL FUM 200-5 MCG/ACT IN AERO Inhalation Inhale 2 puffs into the lungs 2 (two) times daily. 1 Inhaler 6  . MONTELUKAST SODIUM 10 MG PO TABS Oral Take 10 mg by mouth at bedtime.     . OXYCODONE-ACETAMINOPHEN 5-325 MG PO TABS Oral Take 1-2 tablets by mouth every 6 (six) hours as needed for pain. 25 tablet 0    BP 176/110  Pulse 126  Temp(Src) 97.6 F (36.4 C) (Oral)  Resp 18  SpO2 98%  Physical Exam  Vitals reviewed. Constitutional: She is oriented to person, place, and time. Vital signs are  normal. She appears well-developed and well-nourished. No distress.  HENT:  Head: Normocephalic and atraumatic.  Eyes: Pupils are equal, round, and reactive to light.  Neck: Neck supple.  Pulmonary/Chest: Effort normal.  Neurological: She is alert and oriented to person, place, and time.  Skin: Skin is warm and dry. No rash noted. No erythema. No pallor.       Healing draining abscess of her right axilla. Tender to palpation. Patient has an indurated region. No fluctuance palpated.  Psychiatric: She has a normal mood and affect. Her behavior is normal.    ED Course  Procedures   MDM   Advised patient that we do not need to redo the I&D and she's not fluctuant. Patient is currently still taking antibiotics and pain medication. Advised return if abscess begins to feel like a "Water balloon". Also place patient in a wrist splint for tendinitis.       Thomasene Lot, PA-C 08/04/11 [phone]

## 2011-08-04 NOTE — ED Provider Notes (Signed)
Medical screening examination/treatment/procedure(s) were performed by non-physician practitioner and as supervising physician I was immediately available for consultation/collaboration.  Sunnie Nielsen, MD 08/04/11 [phone]

## 2011-08-04 NOTE — Discharge Instructions (Signed)
Abscess An abscess (boil or furuncle) is an infected area that contains a collection of pus.  SYMPTOMS Signs and symptoms of an abscess include pain, tenderness, redness, or hardness. You may feel a moveable soft area under your skin. An abscess can occur anywhere in the body.  TREATMENT  A surgical cut (incision) may be made over your abscess to drain the pus. Gauze may be packed into the space or a drain may be looped through the abscess cavity (pocket). This provides a drain that will allow the cavity to heal from the inside outwards. The abscess may be painful for a few days, but should feel much better if it was drained.  Your abscess, if seen early, may not have localized and may not have been drained. If not, another appointment may be required if it does not get better on its own or with medications. HOME CARE INSTRUCTIONS   Only take over-the-counter or prescription medicines for pain, discomfort, or fever as directed by your caregiver.   Take your antibiotics as directed if they were prescribed. Finish them even if you start to feel better.   Keep the skin and clothes clean around your abscess.   If the abscess was drained, you will need to use gauze dressing to collect any draining pus. Dressings will typically need to be changed 3 or more times a day.   The infection may spread by skin contact with others. Avoid skin contact as much as possible.   Practice good hygiene. This includes regular hand washing, cover any draining skin lesions, and do not share personal care items.   If you participate in sports, do not share athletic equipment, towels, whirlpools, or personal care items. Shower after every practice or tournament.   If a draining area cannot be adequately covered:   Do not participate in sports.   Children should not participate in day care until the wound has healed or drainage stops.   If your caregiver has given you a follow-up appointment, it is very important  to keep that appointment. Not keeping the appointment could result in a much worse infection, chronic or permanent injury, pain, and disability. If there is any problem keeping the appointment, you must call back to this facility for assistance.  SEEK MEDICAL CARE IF:   You develop increased pain, swelling, redness, drainage, or bleeding in the wound site.   You develop signs of generalized infection including muscle aches, chills, fever, or a general ill feeling.   You have an oral temperature above 102 F (38.9 C).  MAKE SURE YOU:   Understand these instructions.   Will watch your condition.   Will get help right away if you are not doing well or get worse.  Document Released: 12/26/2004 Document Revised: 03/07/2011 Document Reviewed: 10/20/2007 [facility] Patient Information 2012 [place], [place].Tendinitis Tendinitis is swelling and inflammation of the tendons. Tendons are band-like tissues that connect muscle to bone. Tendinitis commonly occurs in the:   Shoulders (rotator cuff).   Heels (Achilles tendon).   Elbows (triceps tendon).  CAUSES Tendinitis is usually caused by overusing the tendon, muscles, and joints involved. When the tissue surrounding a tendon (synovium) becomes inflamed, it is called tenosynovitis. Tendinitis commonly develops in people whose jobs require repetitive motions. SYMPTOMS  Pain.   Tenderness.   Mild swelling.  DIAGNOSIS Tendinitis is usually diagnosed by physical exam. Your caregiver may also order X-rays or other imaging tests. TREATMENT Your caregiver may recommend certain medicines or exercises for your treatment.  HOME CARE INSTRUCTIONS   Use a sling or splint for as long as directed by your caregiver until the pain decreases.   Put ice on the injured area.   Put ice in a plastic bag.   Place a towel between your skin and the bag.   Leave the ice on for 15 to 20 minutes, 3 to 4 times a day.   Avoid using the limb while the tendon is  painful. Perform gentle range of motion exercises only as directed by your caregiver. Stop exercises if pain or discomfort increase, unless directed otherwise by your caregiver.   Only take over-the-counter or prescription medicines for pain, discomfort, or fever as directed by your caregiver.  SEEK MEDICAL CARE IF:   Your pain and swelling increase.   You develop new, unexplained symptoms, especially increased numbness in the hands.  MAKE SURE YOU:   Understand these instructions.   Will watch your condition.   Will get help right away if you are not doing well or get worse.  Document Released: 03/15/2000 Document Revised: 03/07/2011 Document Reviewed: 06/04/2010 [facility] Patient Information 2012 [place], [place].

## 2011-08-04 NOTE — ED Notes (Signed)
PA Cyndie Chime advised of elevated blood pressure

## 2011-08-04 NOTE — ED Notes (Signed)
Pt alert, nad, c/o wound check to right axilla, recent I/D on last Thursday, returns for wound eval, area firm approx 3cm in diameter, open non draining wound noted.

## 2011-08-04 NOTE — ED Notes (Addendum)
Pt presented to the ER with c/o post op complications, states that "ribbon is stuck", pt reports discomfort and increase of pain, pt also states she did try to take it out, however, not successful. Pt is to be seen by Dr. Luisa Hart on the 16th.  Also pt would like to have her left thumb looked at, reports discomfort.

## 2011-08-15 ENCOUNTER — Ambulatory Visit (INDEPENDENT_AMBULATORY_CARE_PROVIDER_SITE_OTHER): Payer: Self-pay | Admitting: Surgery

## 2011-08-15 ENCOUNTER — Encounter (INDEPENDENT_AMBULATORY_CARE_PROVIDER_SITE_OTHER): Payer: Self-pay | Admitting: Surgery

## 2011-08-15 ENCOUNTER — Other Ambulatory Visit (INDEPENDENT_AMBULATORY_CARE_PROVIDER_SITE_OTHER): Payer: Self-pay

## 2011-08-15 VITALS — BP 148/86 | HR 88 | Ht 66.0 in | Wt 195.6 lb

## 2011-08-15 DIAGNOSIS — L732 Hidradenitis suppurativa: Secondary | ICD-10-CM

## 2011-08-15 DIAGNOSIS — M779 Enthesopathy, unspecified: Secondary | ICD-10-CM

## 2011-08-15 NOTE — Patient Instructions (Signed)
Hidradenitis Suppurativa, Sweat Gland Abscess Hidradenitis suppurativa is a long lasting (chronic), uncommon disease of the sweat glands. With this, boil-like lumps and scarring develop in the groin, some times under the arms (axillae), and under the breasts. It may also uncommonly occur behind the ears, in the crease of the buttocks, and around the genitals.  CAUSES  The cause is from a blocking of the sweat glands. They then become infected. It may cause drainage and odor. It is not contagious. So it cannot be given to someone else. It most often shows up in puberty (about 10 to 38 years of age). But it may happen much later. It is similar to acne which is a disease of the sweat glands. This condition is slightly more common in African-Americans and women. SYMPTOMS   Hidradenitis usually starts as one or more red, tender, swellings in the groin or under the arms (axilla).   Over a period of hours to days the lesions get larger. They often open to the skin surface, draining clear to yellow-colored fluid.   The infected area heals with scarring.  DIAGNOSIS  Your caregiver makes this diagnosis by looking at you. Sometimes cultures (growing germs on plates in the lab) may be taken. This is to see what germ (bacterium) is causing the infection.  TREATMENT   Topical germ killing medicine applied to the skin (antibiotics) are the treatment of choice. Antibiotics taken by mouth (systemic) are sometimes needed when the condition is getting worse or is severe.   Avoid tight-fitting clothing which traps moisture in.   Dirt does not cause hidradenitis and it is not caused by poor hygiene.   Involved areas should be cleaned daily using an antibacterial soap. Some patients find that the liquid form of Lever 2000, applied to the involved areas as a lotion after bathing, can help reduce the odor related to this condition.   Sometimes surgery is needed to drain infected areas or remove scarred tissue.  Removal of large amounts of tissue is used only in severe cases.   Birth control pills may be helpful.   Oral retinoids (vitamin A derivatives) for 6 to 12 months which are effective for acne may also help this condition.   Weight loss will improve but not cure hidradenitis. It is made worse by being overweight. But the condition is not caused by being overweight.   This condition is more common in people who have had acne.   It may become worse under stress.  There is no medical cure for hidradenitis. It can be controlled, but not cured. The condition usually continues for years with periods of getting worse and getting better (remission). Document Released: 10/31/2003 Document Revised: 03/07/2011 Document Reviewed: 11/16/2007 [employer] Patient Information 2012 [employer]. 

## 2011-08-15 NOTE — Progress Notes (Signed)
Patient ID: Erica [name], female   DOB: 1976-02-18, 36 y.o.   MRN: [id number]  No chief complaint on file.   HPI ALIVIANA [name] is a 36 y.o. female.   HPI Patient returns to clinic. She has a history of hidradenitis involving both axilla and duct were also drained an abscess from the right side 2 weeks ago. She is better but would like to proceed with excision of these areas since they are becoming more problematic with pain, drainage and infection. Past Medical History  Diagnosis Date  . Breast cancer 2010  . Asthma   . Hypertension   . GERD (gastroesophageal reflux disease)   . Impaired glucose tolerance 04/13/2011  . Anemia, unspecified 04/13/2011  . Asthma 09/27/2010  . Bloating 03/18/2011  . 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 Lymphadenitis, chronic     restricted LEFT extremity    Past Surgical History  Procedure Date  . Hand surgery   . Breast lumpectomy 09/2008  . Carpal tunnel release     left hand  . Tubal ligation 05/11/2011    Procedure: POST PARTUM TUBAL LIGATION;  Surgeon: Scheryl Darter, MD;  Location: [facility];  Service: Gynecology;  Laterality: Bilateral;  Induced for HTN    Family History  Problem Relation Age of Onset  . Diabetes Maternal Grandmother   . Heart disease Maternal Grandmother     Social History History  Substance Use Topics  . Smoking status: Never Smoker   . Smokeless tobacco: Never Used  . Alcohol Use: No    Allergies  Allergen Reactions  . Aspirin Shortness Of Breath and Palpitations    Pt can take ibuprofen without reaction  . Penicillins Shortness Of Breath and Palpitations    Current Outpatient Prescriptions  Medication Sig Dispense Refill  . albuterol (PROVENTIL) (2.5 MG/3ML) 0.083% nebulizer solution Take 2.5 mg by nebulization every 4 (four) hours as needed.        Marland Kitchen amLODipine (NORVASC) 10 MG tablet Take 1 tablet (10 mg total) by mouth daily.  30 tablet  1  . docusate sodium (COLACE) 100 MG capsule Take 1 capsule (100 mg  total) by mouth daily as needed for constipation.  30 capsule  1  . doxycycline (VIBRA-TABS) 100 MG tablet Take 1 tablet (100 mg total) by mouth 2 (two) times daily.  14 tablet  0  . Fluticasone-Salmeterol (ADVAIR) 250-50 MCG/DOSE AEPB Inhale 1 puff into the lungs every 12 (twelve) hours.        . hydrochlorothiazide (HYDRODIURIL) 25 MG tablet Take 1 tablet (25 mg total) by mouth daily.  20 tablet  0  . ipratropium (ATROVENT) 0.02 % nebulizer solution Take 500 mcg by nebulization 2 (two) times daily as needed. For shortness of breath      . labetalol (NORMODYNE) 200 MG tablet Take 400 mg by mouth 2 (two) times daily.      . Mometasone Furo-Formoterol Fum (DULERA) 200-5 MCG/ACT AERO Inhale 2 puffs into the lungs 2 (two) times daily.  1 Inhaler  6  . montelukast (SINGULAIR) 10 MG tablet Take 10 mg by mouth at bedtime.       Marland Kitchen oxyCODONE-acetaminophen (ROXICET) 5-325 MG per tablet Take 1-2 tablets by mouth every 6 (six) hours as needed for pain.  25 tablet  0    Review of Systems Review of Systems  Constitutional: Negative for fever, chills and unexpected weight change.  HENT: Negative for hearing loss, congestion, sore throat, trouble swallowing and voice change.  Eyes: Negative for visual disturbance.  Respiratory: Negative for cough and wheezing.   Cardiovascular: Negative for chest pain, palpitations and leg swelling.  Gastrointestinal: Negative for nausea, vomiting, abdominal pain, diarrhea, constipation, blood in stool, abdominal distention and anal bleeding.  Genitourinary: Negative for hematuria, vaginal bleeding and difficulty urinating.  Musculoskeletal: Negative for arthralgias.  Skin: Negative for rash and wound.  Neurological: Negative for seizures, syncope and headaches.  Hematological: Negative for adenopathy. Does not bruise/bleed easily.  Psychiatric/Behavioral: Negative for confusion.    Blood pressure 148/86, pulse 88, height 5\' 6"  (1.676 m), weight 195 lb 9.6 oz (88.724  kg).  Physical Exam Physical Exam  Constitutional: She is oriented to person, place, and time. She appears well-developed and well-nourished.  HENT:  Head: Normocephalic and atraumatic.  Eyes: EOM are normal. Pupils are equal, round, and reactive to light.  Neck: Normal range of motion. Neck supple.  Cardiovascular: Normal heart sounds.   Pulmonary/Chest: Effort normal and breath sounds normal.  Abdominal: Soft. Bowel sounds are normal.  Musculoskeletal: She exhibits tenderness.  Neurological: She is alert and oriented to person, place, and time.  Skin: Skin is warm and dry.  Psychiatric: She has a normal mood and affect. Her behavior is normal. Judgment and thought content normal.    Data Reviewed Dr Andrey Campanile note  Assessment    Bilateral hidradenitis suppurativa axilla    Plan    Excision of bilateral hidradenitis suppurativa.The procedure has been discussed with the patient. The risks,  benefits and alternatives to surgery have been discussed. The patient understands the reason for the procedure and agrees to proceed. All questions are answered today to the best of my ability.Risk of bleeding,  Infection,  Wound breakdown,  And more surgery.       Rigley Niess A. 08/15/2011, 5:22 PM

## 2011-09-10 ENCOUNTER — Other Ambulatory Visit: Payer: Self-pay | Admitting: Lab

## 2011-09-17 ENCOUNTER — Other Ambulatory Visit: Payer: Self-pay | Admitting: Oncology

## 2011-09-17 ENCOUNTER — Emergency Department (HOSPITAL_COMMUNITY): Payer: Self-pay

## 2011-09-17 ENCOUNTER — Ambulatory Visit (HOSPITAL_BASED_OUTPATIENT_CLINIC_OR_DEPARTMENT_OTHER): Payer: Self-pay | Admitting: Oncology

## 2011-09-17 ENCOUNTER — Other Ambulatory Visit: Payer: Self-pay | Admitting: *Deleted

## 2011-09-17 ENCOUNTER — Encounter (HOSPITAL_COMMUNITY): Payer: Self-pay

## 2011-09-17 ENCOUNTER — Inpatient Hospital Stay (HOSPITAL_COMMUNITY)
Admission: EM | Admit: 2011-09-17 | Discharge: 2011-09-21 | DRG: 092 | Disposition: A | Discharge status: Home / Self Care | Payer: MEDICAID | Attending: Internal Medicine | Admitting: Internal Medicine

## 2011-09-17 VITALS — BP 191/112 | HR 112 | Temp 98.7°F | Ht 66.0 in | Wt 203.5 lb

## 2011-09-17 DIAGNOSIS — I10 Essential (primary) hypertension: Secondary | ICD-10-CM

## 2011-09-17 DIAGNOSIS — Z9010 Acquired absence of unspecified breast and nipple: Secondary | ICD-10-CM

## 2011-09-17 DIAGNOSIS — H5461 Unqualified visual loss, right eye, normal vision left eye: Secondary | ICD-10-CM | POA: Diagnosis present

## 2011-09-17 DIAGNOSIS — O10019 Pre-existing essential hypertension complicating pregnancy, unspecified trimester: Secondary | ICD-10-CM

## 2011-09-17 DIAGNOSIS — O0930 Supervision of pregnancy with insufficient antenatal care, unspecified trimester: Secondary | ICD-10-CM

## 2011-09-17 DIAGNOSIS — E876 Hypokalemia: Secondary | ICD-10-CM

## 2011-09-17 DIAGNOSIS — O169 Unspecified maternal hypertension, unspecified trimester: Secondary | ICD-10-CM

## 2011-09-17 DIAGNOSIS — O358XX0 Maternal care for other (suspected) fetal abnormality and damage, not applicable or unspecified: Secondary | ICD-10-CM

## 2011-09-17 DIAGNOSIS — I161 Hypertensive emergency: Secondary | ICD-10-CM

## 2011-09-17 DIAGNOSIS — C50919 Malignant neoplasm of unspecified site of unspecified female breast: Secondary | ICD-10-CM

## 2011-09-17 DIAGNOSIS — J45901 Unspecified asthma with (acute) exacerbation: Secondary | ICD-10-CM | POA: Diagnosis present

## 2011-09-17 DIAGNOSIS — H5460 Unqualified visual loss, one eye, unspecified: Secondary | ICD-10-CM | POA: Diagnosis present

## 2011-09-17 DIAGNOSIS — H4710 Unspecified papilledema: Principal | ICD-10-CM | POA: Diagnosis present

## 2011-09-17 DIAGNOSIS — Z923 Personal history of irradiation: Secondary | ICD-10-CM

## 2011-09-17 DIAGNOSIS — Z853 Personal history of malignant neoplasm of breast: Secondary | ICD-10-CM

## 2011-09-17 DIAGNOSIS — Z9221 Personal history of antineoplastic chemotherapy: Secondary | ICD-10-CM

## 2011-09-17 DIAGNOSIS — I1A0 Resistant hypertension: Secondary | ICD-10-CM

## 2011-09-17 DIAGNOSIS — N179 Acute kidney failure, unspecified: Secondary | ICD-10-CM | POA: Diagnosis not present

## 2011-09-17 DIAGNOSIS — H547 Unspecified visual loss: Secondary | ICD-10-CM

## 2011-09-17 HISTORY — DX: Resistant hypertension: I1A.0

## 2011-09-17 HISTORY — DX: Essential (primary) hypertension: I10

## 2011-09-17 LAB — CBC
Platelets: 204 10*3/uL (ref 150–400)
RBC: 4.64 MIL/uL (ref 3.87–5.11)
RDW: 12.6 % (ref 11.5–15.5)
WBC: 5.1 10*3/uL (ref 4.0–10.5)

## 2011-09-17 LAB — URINALYSIS, ROUTINE W REFLEX MICROSCOPIC
Glucose, UA: NEGATIVE mg/dL
Protein, ur: 100 mg/dL — AB
Specific Gravity, Urine: 1.024 (ref 1.005–1.030)

## 2011-09-17 LAB — URINE MICROSCOPIC-ADD ON

## 2011-09-17 LAB — DIFFERENTIAL
Basophils Absolute: 0 10*3/uL (ref 0.0–0.1)
Basophils Relative: 1 % (ref 0–1)
Eosinophils Absolute: 0.2 10*3/uL (ref 0.0–0.7)
Eosinophils Relative: 4 % (ref 0–5)
Lymphocytes Relative: 35 % (ref 12–46)
Lymphs Abs: 1.8 10*3/uL (ref 0.7–4.0)
Monocytes Absolute: 0.4 10*3/uL (ref 0.1–1.0)
Monocytes Relative: 7 % (ref 3–12)
Neutro Abs: 2.7 10*3/uL (ref 1.7–7.7)
Neutrophils Relative %: 53 % (ref 43–77)

## 2011-09-17 LAB — PREGNANCY, URINE: Preg Test, Ur: NEGATIVE

## 2011-09-17 LAB — BASIC METABOLIC PANEL
CO2: 28 mEq/L (ref 19–32)
Calcium: 9.4 mg/dL (ref 8.4–10.5)
Chloride: 100 mEq/L (ref 96–112)
Potassium: 3 mEq/L — ABNORMAL LOW (ref 3.5–5.1)
Sodium: 137 mEq/L (ref 135–145)

## 2011-09-17 MED ORDER — ONDANSETRON HCL 4 MG/2ML IJ SOLN
4.0000 mg | Freq: Once | INTRAMUSCULAR | Status: AC
  Administered 2011-09-17: 4 mg via INTRAVENOUS
  Filled 2011-09-17: qty 2

## 2011-09-17 MED ORDER — AMLODIPINE BESYLATE 10 MG PO TABS: 10.0000 mg | ORAL_TABLET | Freq: Every day | ORAL | Status: DC

## 2011-09-17 MED ORDER — FLUTICASONE-SALMETEROL 250-50 MCG/DOSE IN AEPB: 1.0000 | INHALATION_SPRAY | Freq: Two times a day (BID) | RESPIRATORY_TRACT | Status: DC

## 2011-09-17 MED ORDER — POTASSIUM CHLORIDE CRYS ER 20 MEQ PO TBCR
40.0000 meq | EXTENDED_RELEASE_TABLET | Freq: Once | ORAL | Status: AC
  Administered 2011-09-17: 40 meq via ORAL

## 2011-09-17 MED ORDER — IPRATROPIUM BROMIDE 0.02 % IN SOLN: 500.0000 ug | Freq: Two times a day (BID) | RESPIRATORY_TRACT | Status: DC | PRN

## 2011-09-17 MED ORDER — MOMETASONE FURO-FORMOTEROL FUM 200-5 MCG/ACT IN AERO: 2.0000 | INHALATION_SPRAY | Freq: Two times a day (BID) | RESPIRATORY_TRACT | Status: DC

## 2011-09-17 MED ORDER — ALBUTEROL SULFATE (2.5 MG/3ML) 0.083% IN NEBU: 2.5000 mg | INHALATION_SOLUTION | RESPIRATORY_TRACT | Status: DC | PRN

## 2011-09-17 MED ORDER — LABETALOL HCL 200 MG PO TABS: 400.0000 mg | ORAL_TABLET | Freq: Two times a day (BID) | ORAL | Status: DC

## 2011-09-17 MED ORDER — HYDROCHLOROTHIAZIDE 25 MG PO TABS: 25.0000 mg | ORAL_TABLET | Freq: Every day | ORAL | Status: DC

## 2011-09-17 MED ORDER — LORAZEPAM 2 MG/ML IJ SOLN
1.0000 mg | Freq: Once | INTRAMUSCULAR | Status: AC
  Administered 2011-09-17: 1 mg via INTRAVENOUS
  Filled 2011-09-17: qty 1

## 2011-09-17 MED ORDER — LABETALOL HCL 5 MG/ML IV SOLN
20.0000 mg | Freq: Once | INTRAVENOUS | Status: AC
  Administered 2011-09-17: 20 mg via INTRAVENOUS
  Filled 2011-09-17: qty 4

## 2011-09-17 MED ORDER — POTASSIUM CHLORIDE CRYS ER 20 MEQ PO TBCR
EXTENDED_RELEASE_TABLET | ORAL | Status: AC
  Filled 2011-09-17: qty 2

## 2011-09-17 MED ORDER — ALBUTEROL SULFATE HFA 108 (90 BASE) MCG/ACT IN AERS
2.0000 | INHALATION_SPRAY | RESPIRATORY_TRACT | Status: DC | PRN
  Administered 2011-09-17: 2 via RESPIRATORY_TRACT
  Filled 2011-09-17: qty 6.7

## 2011-09-17 MED ORDER — MONTELUKAST SODIUM 10 MG PO TABS: 10.0000 mg | ORAL_TABLET | Freq: Every day | ORAL | Status: DC

## 2011-09-17 NOTE — ED Provider Notes (Addendum)
History     CSN: [id number]  Arrival date & time 09/17/11  1758   First MD Initiated Contact with Patient 09/17/11 1947      Chief Complaint  Patient presents with  . Hypertension    (Consider location/radiation/quality/duration/timing/severity/associated sxs/prior treatment) HPI Comments: Pt was sent over here from her oncologist's office with elevated BP, loss of vision in right eye.  Pt states that 3 days ago, had loss of vision right eye.  No pain in eye.  Can see light in eye, but cannot read anything.  No headache.  No dizziness.  No numbness to face or speech problems.  No balance problems.  No numbness or weakness to extremities.  Pt was seen by Dr. Darnelle Catalan and sent over to opthamologist who noted pt to have papilledema to both eyes, was concerned that pt had "stroke in her right eye".  Sent over here for hypertensive emergency with vision loss.  Pt says that she has been out of her BP meds for about 4 days.  The history is provided by the patient.    Past Medical History  Diagnosis Date  . Breast cancer 2010  . Asthma   . Hypertension   . GERD (gastroesophageal reflux disease)   . Impaired glucose tolerance 04/13/2011  . Anemia, unspecified 04/13/2011  . Asthma 09/27/2010  . Bloating 03/18/2011  . 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 Lymphadenitis, chronic     restricted LEFT extremity    Past Surgical History  Procedure Date  . Hand surgery   . Breast lumpectomy 09/2008  . Carpal tunnel release     left hand  . Tubal ligation 05/11/2011    Procedure: POST PARTUM TUBAL LIGATION;  Surgeon: Scheryl Darter, MD;  Location: [facility];  Service: Gynecology;  Laterality: Bilateral;  Induced for HTN    Family History  Problem Relation Age of Onset  . Diabetes Maternal Grandmother   . Heart disease Maternal Grandmother     History  Substance Use Topics  . Smoking status: Never Smoker   . Smokeless tobacco: Never Used  . Alcohol Use: No    OB History    Grav Para Term  Preterm Abortions TAB SAB Ect Mult Living   6 4 4  1  1   4       Review of Systems  Constitutional: Negative for fever, chills, diaphoresis and fatigue.  HENT: Negative for congestion, rhinorrhea and sneezing.   Eyes: Positive for visual disturbance. Negative for photophobia, pain, discharge and redness.  Respiratory: Negative for cough, chest tightness and shortness of breath.   Cardiovascular: Negative for chest pain and leg swelling.  Gastrointestinal: Negative for nausea, vomiting, abdominal pain, diarrhea and blood in stool.  Genitourinary: Negative for frequency, hematuria, flank pain and difficulty urinating.  Musculoskeletal: Negative for back pain and arthralgias.  Skin: Negative for rash.  Neurological: Negative for dizziness, speech difficulty, weakness, numbness and headaches.    Allergies  Aspirin and Penicillins  Home Medications   Current Outpatient Rx  Name Route Sig Dispense Refill  . ALBUTEROL SULFATE (2.5 MG/3ML) 0.083% IN NEBU Nebulization Take 3 mLs (2.5 mg total) by nebulization every 4 (four) hours as needed. 75 mL 1  . AMLODIPINE BESYLATE 10 MG PO TABS Oral Take 1 tablet (10 mg total) by mouth daily. 30 tablet 1  . FLUTICASONE-SALMETEROL 250-50 MCG/DOSE IN AEPB Inhalation Inhale 1 puff into the lungs every 12 (twelve) hours. 60 each 1  . HYDROCHLOROTHIAZIDE 25 MG PO TABS Oral  Take 1 tablet (25 mg total) by mouth daily. 20 tablet 0  . IPRATROPIUM BROMIDE 0.02 % IN SOLN Nebulization Take 2.5 mLs (500 mcg total) by nebulization 2 (two) times daily as needed. For shortness of breath 75 mL 1  . LABETALOL HCL 200 MG PO TABS Oral Take 2 tablets (400 mg total) by mouth 2 (two) times daily. 120 tablet 1  . MOMETASONE FURO-FORMOTEROL FUM 200-5 MCG/ACT IN AERO Inhalation Inhale 2 puffs into the lungs 2 (two) times daily. 1 Inhaler 6  . MONTELUKAST SODIUM 10 MG PO TABS Oral Take 1 tablet (10 mg total) by mouth at bedtime. 30 tablet 1  . OXYCODONE-ACETAMINOPHEN 5-325 MG PO  TABS Oral Take 1-2 tablets by mouth every 6 (six) hours as needed for pain. 25 tablet 0    BP 152/84  Pulse 84  Temp 98.5 F (36.9 C) (Oral)  Resp 20  SpO2 99%  Breastfeeding? No  Physical Exam  Constitutional: She is oriented to person, place, and time. She appears well-developed and well-nourished.  HENT:  Head: Normocephalic and atraumatic.  Eyes: Conjunctivae are normal. Pupils are equal, round, and reactive to light.       Pt cannont make out my fingers with right eye.  Only sees light and blurriness  Neck: Normal range of motion. Neck supple.  Cardiovascular: Normal rate, regular rhythm and normal heart sounds.   Pulmonary/Chest: Effort normal and breath sounds normal. No respiratory distress. She has no wheezes. She has no rales. She exhibits no tenderness.  Abdominal: Soft. Bowel sounds are normal. There is no tenderness. There is no rebound and no guarding.  Musculoskeletal: Normal range of motion. She exhibits no edema.  Lymphadenopathy:    She has no cervical adenopathy.  Neurological: She is alert and oriented to person, place, and time. She has normal strength. No cranial nerve deficit or sensory deficit. GCS eye subscore is 4. GCS verbal subscore is 5. GCS motor subscore is 6.  Skin: Skin is warm and dry. No rash noted.  Psychiatric: She has a normal mood and affect.    ED Course  Procedures (including critical care time)  Results for orders placed during the hospital encounter of 09/17/11  CBC      Component Value Range   WBC 5.1  4.0 - 10.5 K/uL   RBC 4.64  3.87 - 5.11 MIL/uL   Hemoglobin 14.6  12.0 - 15.0 g/dL   HCT 11.9  14.7 - 82.9 %   MCV 89.0  78.0 - 100.0 fL   MCH 31.5  26.0 - 34.0 pg   MCHC 35.4  30.0 - 36.0 g/dL   RDW 56.2  13.0 - 86.5 %   Platelets 204  150 - 400 K/uL  DIFFERENTIAL      Component Value Range   Neutrophils Relative 53  43 - 77 %   Neutro Abs 2.7  1.7 - 7.7 K/uL   Lymphocytes Relative 35  12 - 46 %   Lymphs Abs 1.8  0.7 - 4.0  K/uL   Monocytes Relative 7  3 - 12 %   Monocytes Absolute 0.4  0.1 - 1.0 K/uL   Eosinophils Relative 4  0 - 5 %   Eosinophils Absolute 0.2  0.0 - 0.7 K/uL   Basophils Relative 1  0 - 1 %   Basophils Absolute 0.0  0.0 - 0.1 K/uL  BASIC METABOLIC PANEL      Component Value Range   Sodium 137  135 - 145  mEq/L   Potassium 3.0 (*) 3.5 - 5.1 mEq/L   Chloride 100  96 - 112 mEq/L   CO2 28  19 - 32 mEq/L   Glucose, Bld 128 (*) 70 - 99 mg/dL   BUN 9  6 - 23 mg/dL   Creatinine, Ser 1.61  0.50 - 1.10 mg/dL   Calcium 9.4  8.4 - 09.6 mg/dL   GFR calc non Af Amer >90  >90 mL/min   GFR calc Af Amer >90  >90 mL/min  URINALYSIS, ROUTINE W REFLEX MICROSCOPIC      Component Value Range   Color, Urine RED (*) YELLOW   APPearance CLOUDY (*) CLEAR   Specific Gravity, Urine 1.024  1.005 - 1.030   pH 6.0  5.0 - 8.0   Glucose, UA NEGATIVE  NEGATIVE mg/dL   Hgb urine dipstick LARGE (*) NEGATIVE   Bilirubin Urine NEGATIVE  NEGATIVE   Ketones, ur TRACE (*) NEGATIVE mg/dL   Protein, ur 045 (*) NEGATIVE mg/dL   Urobilinogen, UA 1.0  0.0 - 1.0 mg/dL   Nitrite NEGATIVE  NEGATIVE   Leukocytes, UA TRACE (*) NEGATIVE  PREGNANCY, URINE      Component Value Range   Preg Test, Ur NEGATIVE  NEGATIVE  URINE MICROSCOPIC-ADD ON      Component Value Range   Squamous Epithelial / LPF RARE  RARE   WBC, UA 0-2  <3 WBC/hpf   RBC / HPF TOO NUMEROUS TO COUNT  <3 RBC/hpf   Bacteria, UA RARE  RARE   Urine-Other MICROSCOPIC EXAM PERFORMED ON UNCONCENTRATED URINE     Mr Brain Wo Contrast  09/17/2011  *RADIOLOGY REPORT*  Clinical Data: 36 year old female with 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  Hypertension.  History of breast cancer.  MRI HEAD WITHOUT CONTRAST  Technique:  Multiplanar, multiecho pulse sequences of the brain and surrounding structures were obtained according to standard protocol without intravenous contrast.  Comparison: Brain MRI without and with contrast 08/24/2010.  Findings: Stable and normal cerebral volume.  No restricted diffusion to suggest acute infarction.  No midline shift, mass effect, evidence of mass lesion, ventriculomegaly, extra-axial collection or acute intracranial hemorrhage.  Cervicomedullary junction and pituitary are within normal limits.  Major intracranial vascular flow voids are stable.  Stable gray and white matter signal throughout the brain; normal except for occasional nonspecific small foci of T2 and FLAIR hyperintensity in the cerebral white matter, which were better demonstrated on the prior exam due to a degree of motion artifact today despite repeated imaging attempts.  Stable bone marrow signal.  Stable and negative visualized cervical spine.  Visualized paranasal sinuses and mastoids are clear. Negative scalp soft tissues.  Visualized orbit soft tissues are within normal limits.  IMPRESSION: No acute intracranial abnormality.  Stable noncontrast MRI appearance of the brain.  Original Report Authenticated By: Harley Hallmark, M.D.     Mr Brain Wo Contrast  09/17/2011  *RADIOLOGY REPORT*  Clinical Data: 36 year old female with 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  Hypertension.  History of breast cancer.  MRI HEAD WITHOUT CONTRAST  Technique:  Multiplanar, multiecho pulse sequences of the brain and surrounding structures were obtained according to standard protocol without intravenous contrast.  Comparison: Brain MRI without and with contrast 08/24/2010.  Findings: Stable and normal cerebral volume. No restricted diffusion to suggest acute infarction.  No midline shift, mass effect, evidence of mass lesion, ventriculomegaly, extra-axial collection or acute intracranial hemorrhage.  Cervicomedullary junction and pituitary are within normal limits.  Major  intracranial vascular flow voids are stable.  Stable gray and white matter signal throughout the brain; normal except for occasional nonspecific small foci of T2 and FLAIR hyperintensity in the cerebral white matter, which were better  demonstrated on the prior exam due to a degree of motion artifact today despite repeated imaging attempts.  Stable bone marrow signal.  Stable and negative visualized cervical spine.  Visualized paranasal sinuses and mastoids are clear. Negative scalp soft tissues.  Visualized orbit soft tissues are within normal limits.  IMPRESSION: No acute intracranial abnormality.  Stable noncontrast MRI appearance of the brain.  Original Report Authenticated By: Harley Hallmark, M.D.     1. Hypertensive emergency   2. Vision loss   3. Hypokalemia       MDM  Pt given one dose of labetalol and BP came down fairly well.  No change in vision.  MRI does not show any brain mets or other acute CVA.  Discussed with hospitalist who will admit pt.  CRITICAL CARE Performed by: Olesya Wike   Total critical care time: 30  Critical care time was exclusive of separately billable procedures and treating other patients.  Critical care was necessary to treat or prevent imminent or life-threatening deterioration.  Critical care was time spent personally by me on the following activities: development of treatment plan with patient and/or surrogate as well as nursing, discussions with consultants, evaluation of patient's response to treatment, examination of patient, obtaining history from patient or surrogate, ordering and performing treatments and interventions, ordering and review of laboratory studies, ordering and review of radiographic studies, pulse oximetry and re-evaluation of patient's condition.   0115: after admission, Dr. Gilford Silvius spoke with neurologist who felt that LP was in order to r/o pseudotumor.  I spoke with DR. Mayan with neuro who feels that since symptoms have been going on since Sunday, emergent LP tonight is not necessary, can be done in the am     Rolan Bucco, MD 09/17/11 2318  Rolan Bucco, MD 09/17/11 [id number]  Rolan Bucco, MD 09/18/11 1308

## 2011-09-17 NOTE — Progress Notes (Signed)
REFILL RX CALLED INTO [facility] PER PT/MD REQUEST FOR ALBUTEROL, AMLODIPINE, ADVAIR, HCTZ, ATROVENT,LABETALOL, DULERA, SINGULAIR

## 2011-09-17 NOTE — ED Notes (Signed)
Pt eating food brought in by family.

## 2011-09-17 NOTE — ED Notes (Addendum)
Pt sent by Dr. Darnelle Catalan for uncontrolled BP.  On his note sent w/the pt he has requested admission and an MRI of her brain.  Pt c/o inability to focus w/RT eye-was seen by the eye doctor and told she may have had a "stroke in my retina."  Pt states her RT eye has been bothering her since this past Sunday.  Pt's only other complaint is that she's hungry.

## 2011-09-17 NOTE — ED Notes (Signed)
Dr. Rubin Payor made aware of blood pressure reading.

## 2011-09-17 NOTE — Progress Notes (Signed)
ID: [name]   DOB: 07/08/1975  MR#: [id number]  CSN#:[id number]  HISTORY OF PRESENT ILLNESS: Erica [name] noticed a lump in her left breast sometime in August or September 2009.  Initially this was thought to possibly be "dry breast milk" but as the mass got bigger, she brought it to her physician's attention and was set up for mammography.  This was performed May 20, 2008 at [facility] and it showed a palpable 8.5 cm oval circumscribed mass in the left breast upper outer quadrant.  There were prominent left axillary lymph nodes noted as well.  An ultrasound showed this to be hypoechoic, with lobulated margins and probable central necrosis measuring up to 7.3 cm on this modality.  Ultrasound of the left axilla demonstrated numerous prominent lymph nodes at the upper limit of normal, the largest measuring 1 cm with a normal appearing thin cortex and a normal cortex to halo ratio.    Because the patient had no insurance, she was referred to [facility] and biopsy was performed in [place] with the pathology there ([id number]) showing a poorly differentiated malignancy which was triple negative (ER 1%, PR 0%, HER-2/neu 0).  The axillary lymph nodes were not biopsied. With this information, the patient was presented at the Breast Multidisciplinary Conference and the feeling was that, after appropriate staging, the patient would be a good candidate for neoadjuvant treatment and possibly NSABP B40.  Her subsequent history is as detailed below  INTERVAL HISTORY: Erica [name] returns today with her significant other, Erica [name], for followup of her breast cancer. In the clinic she was found to have a blood pressure of 191/112 (prior May 2013 was 148/86). She tells me she ran out of medicines and just didn't get around to refilling them. On exam, as noted below, there was diminished vision in her right eye. I contacted Dr. Maris Berger and she was kind enough to work the patient in immediately. By her exam the patient had  bilateral optic nerve edema and very poor vision on the right, which might or might not be reversible. 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 The patient returned to clinic as instructed and I have directed her to the emergency room at [facility] for optimal blood pressure control and further evaluation, likely to include a brain MRI.  REVIEW OF SYSTEMS: Surprisingly Brenley did not report any headaches. She felt her right eye was swollen and that was the reason her vision has not been good in that eye for the last 2 days. She denies dizziness, nausea, vomiting, or double vision. 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 Otherwise a detailed review of systems was stable.  PAST MEDICAL HISTORY: Past Medical History  Diagnosis Date  . Breast cancer 2010  . Asthma   . Hypertension   . GERD (gastroesophageal reflux disease)   . Impaired glucose tolerance 04/13/2011  . Anemia, unspecified 04/13/2011  . Asthma 09/27/2010  . Bloating 03/18/2011  . 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 Lymphadenitis, chronic     restricted LEFT extremity    PAST SURGICAL HISTORY: Past Surgical History  Procedure Date  . Hand surgery   . Breast lumpectomy 09/2008  . Carpal tunnel release  left hand  . Tubal ligation 05/11/2011    Procedure: POST PARTUM TUBAL LIGATION;  Surgeon: Scheryl Darter, MD;  Location: [facility];  Service: Gynecology;  Laterality: Bilateral;  Induced for HTN    FAMILY HISTORY Family History  Problem Relation Age of Onset  . Diabetes Maternal Grandmother   . Heart disease Maternal Grandmother   The patient's parents are alive. The patient has one brother and one sister.  There is no ovarian or  breast cancer in the immediate family, but the patient's maternal grandmother, who was one of 10 sisters, had one sister with breast cancer diagnosed in her sixties.    GYNECOLOGIC HISTORY: The patient is GX P4, first pregnancy to term at age 36.  She stopped having periods at the time of chemotherapy, and had had no periods for the last 2 years, then became pregnant. This was a surprise to her, and she was not aware of the pregnancy until the seventh month. The baby is now 58 months old. She has not had a period since the birth. She is not nursing.  SOCIAL HISTORY: Currently she is unemployed but she has been a Social research officer, government in the past.  She moved to [place] from [place] about 2007. She lives with Erica [name] who is present today.  He has a job Health and safety inspector. Kaysie has three children,  all living with the patient's mother in [place], and now of course baby Jeri Modena, who is a home with her and Erica [name]. 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 Smoking status: Never Smoker   . Smokeless tobacco: Never Used  . Alcohol Use: No     Colonoscopy:  PAP:  Bone density:  Lipid panel:  Allergies  Allergen Reactions  . Aspirin Shortness Of Breath and Palpitations    Pt can take ibuprofen without reaction  . Penicillins Shortness Of Breath and Palpitations    Current Outpatient Prescriptions  Medication Sig Dispense Refill  . albuterol (PROVENTIL) (2.5 MG/3ML) 0.083% nebulizer solution Take 2.5 mg by nebulization every 4 (four) hours as needed.        Marland Kitchen amLODipine (NORVASC) 10 MG tablet Take 1 tablet (10 mg total) by mouth daily.  30 tablet  1  . docusate sodium (COLACE) 100 MG capsule Take 1 capsule (100 mg total) by mouth daily as needed for constipation.  30 capsule  1  . Fluticasone-Salmeterol (ADVAIR) 250-50 MCG/DOSE AEPB Inhale 1 puff into the lungs every 12 (twelve) hours.        . hydrochlorothiazide  (HYDRODIURIL) 25 MG tablet Take 1 tablet (25 mg total) by mouth daily.  20 tablet  0  . ipratropium (ATROVENT) 0.02 % nebulizer solution Take 500 mcg by nebulization 2 (two) times daily as needed. For shortness of breath      . labetalol (NORMODYNE) 200 MG tablet Take 400 mg by mouth 2 (two) times daily.      . Mometasone Furo-Formoterol Fum (DULERA) 200-5 MCG/ACT AERO Inhale 2 puffs into the lungs 2 (two) times daily.  1 Inhaler  6  . montelukast (SINGULAIR) 10 MG tablet Take 10 mg by mouth at bedtime.       Marland Kitchen oxyCODONE-acetaminophen (ROXICET) 5-325 MG per tablet Take 1-2 tablets by mouth every 6 (six) hours as needed for pain.  25 tablet  0    OBJECTIVE: Young [place] American female who appears slightly anxious Filed Vitals:   09/17/11 1423  BP:  191/112  Pulse: 112  Temp: 98.7 F (37.1 C)     Body mass index is 32.85 kg/(m^2).    ECOG FS: 1  Sclerae unicteric; pupils equal and reactive bilaterally; she has decreased vision  in the right eye (cannot tell me how many fingers I am holding up), grossly normal vision on the left. I was not able to visualize the optic disc in the right eye.  Oropharynx clear No cervical or supraclavicular adenopathy Lungs no rales or rhonchi Heart regular rate and rhythm Abd soft, nontender, positive bowel sounds MSK no focal spinal tenderness, no peripheral edema Neuro: nonfocal Breasts: The right breast is unremarkable. The left breast is status post lumpectomy and radiation. There is no evidence of local recurrence.  LAB RESULTS: Lab Results  Component Value Date   WBC 4.7 05/30/2011   NEUTROABS 2.5 05/30/2011   HGB 14.5 05/30/2011   HCT 41.2 05/30/2011   MCV 85.5 05/30/2011   PLT 257 05/30/2011      Chemistry      Component Value Date/Time   NA 136 05/30/2011 1619   K 3.3* 05/30/2011 1619   CL 101 05/30/2011 1619   CO2 25 05/30/2011 1619   BUN 6 05/30/2011 1619   CREATININE 0.84 05/30/2011 1619   CREATININE 0.64 05/02/2011 1021      Component  Value Date/Time   CALCIUM 9.0 05/30/2011 1619   ALKPHOS 73 05/30/2011 1619   AST 45* 05/30/2011 1619   ALT 66* 05/30/2011 1619   BILITOT 0.4 05/30/2011 1619       Lab Results  Component Value Date   LABCA2 11 05/30/2011    No components found with this basename: [id number]    No results found for this basename: INR:1;PROTIME:1 in the last 168 hours  Urinalysis    Component Value Date/Time   COLORURINE YELLOW 02/07/2009 0041   APPEARANCEUR CLOUDY* 02/07/2009 0041   LABSPEC 1.025 05/09/2011 0845   PHURINE 6.5 05/09/2011 0845   GLUCOSEU NEGATIVE 05/09/2011 0845   HGBUR TRACE* 05/09/2011 0845   BILIRUBINUR NEGATIVE 05/09/2011 0845   KETONESUR NEGATIVE 05/09/2011 0845   PROTEINUR 30* 05/09/2011 0845   UROBILINOGEN 2.0* 05/09/2011 0845   NITRITE NEGATIVE 05/09/2011 0845   LEUKOCYTESUR SMALL* 05/09/2011 0845    STUDIES: No results found.  ASSESSMENT: 36 y.o.  woman with history of locally advanced left breast carcinoma initially presenting in March 2010   (1) treated neoadjuvantly with 4 cycles of docetaxel/cyclophosphamide.    (2) status post left lumpectomy and sentinel lymph node dissection in July 2010 for a T2 N1, stage IIB invasive ductal carcinoma, grade 3, triple negative with MIB-1 of 88%.   (3) status post full axillary dissection and margin clearance in August 2010.   (4) status post 11 doses of weekly paclitaxel completed in December 2010   (5) followed by radiation therapy completed in March 2011.   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 We could drop her blood pressure too low or not low enough if we send her home on medication and it may be still possible to salvage vision in the right eye. (I greatly appreciate Dr. Marnee Spring emergent evaluation today; she will be contacting neuro-ophthalmology at [facility] tomorrow for further management suggestions.) As far  as her breast cancer is concerned, there is no evidence of recurrence but it will be useful to review a brain MRI for mets, if one is  obtained for further evaluation this admission. I am making Arlyce a return appointment with [place] in 2 weeks, but we can see her earlier if necessary to monitor her blood pressure once she is on stable oral medications.  Abdo Denault C    09/17/2011

## 2011-09-17 NOTE — ED Notes (Addendum)
Pt received to Rm 23. Alert, oriented, ambulatory. States has blurred vision in right eye.Pt was sent to opth MD prior to arrival.

## 2011-09-18 ENCOUNTER — Telehealth: Payer: Self-pay | Admitting: Oncology

## 2011-09-18 ENCOUNTER — Other Ambulatory Visit: Payer: Self-pay | Admitting: Oncology

## 2011-09-18 ENCOUNTER — Encounter (HOSPITAL_COMMUNITY): Payer: Self-pay | Admitting: Internal Medicine

## 2011-09-18 ENCOUNTER — Inpatient Hospital Stay (HOSPITAL_COMMUNITY): Payer: Self-pay

## 2011-09-18 DIAGNOSIS — H53139 Sudden visual loss, unspecified eye: Secondary | ICD-10-CM

## 2011-09-18 DIAGNOSIS — I10 Essential (primary) hypertension: Secondary | ICD-10-CM

## 2011-09-18 DIAGNOSIS — Z853 Personal history of malignant neoplasm of breast: Secondary | ICD-10-CM

## 2011-09-18 DIAGNOSIS — H4710 Unspecified papilledema: Principal | ICD-10-CM | POA: Diagnosis present

## 2011-09-18 DIAGNOSIS — H547 Unspecified visual loss: Secondary | ICD-10-CM

## 2011-09-18 DIAGNOSIS — J45901 Unspecified asthma with (acute) exacerbation: Secondary | ICD-10-CM | POA: Diagnosis present

## 2011-09-18 DIAGNOSIS — H5461 Unqualified visual loss, right eye, normal vision left eye: Secondary | ICD-10-CM | POA: Diagnosis present

## 2011-09-18 LAB — COMPREHENSIVE METABOLIC PANEL
ALT: 31 U/L (ref 0–35)
AST: 23 U/L (ref 0–37)
Alkaline Phosphatase: 50 U/L (ref 39–117)
CO2: 23 mEq/L (ref 19–32)
Calcium: 9.1 mg/dL (ref 8.4–10.5)
Chloride: 100 mEq/L (ref 96–112)
GFR calc Af Amer: >90 mL/min (ref >90)
GFR calc non Af Amer: 84 mL/min — ABNORMAL LOW (ref >90)
Glucose, Bld: 106 mg/dL — ABNORMAL HIGH (ref 70–99)
Potassium: 3.5 mEq/L (ref 3.5–5.1)
Sodium: 134 mEq/L — ABNORMAL LOW (ref 135–145)
Total Bilirubin: 0.4 mg/dL (ref 0.3–1.2)

## 2011-09-18 LAB — LIPID PANEL
HDL: 43 mg/dL (ref >39)
LDL Cholesterol: 83 mg/dL (ref 0–99)
Triglycerides: 133 mg/dL (ref <150)
VLDL: 27 mg/dL (ref 0–40)

## 2011-09-18 LAB — CBC
Hemoglobin: 14.3 g/dL (ref 12.0–15.0)
MCH: 31.2 pg (ref 26.0–34.0)
MCHC: 34.7 g/dL (ref 30.0–36.0)
RDW: 12.5 % (ref 11.5–15.5)

## 2011-09-18 LAB — CSF CELL COUNT WITH DIFFERENTIAL
Tube #: 1
WBC, CSF: 0 /mm3 (ref 0–5)

## 2011-09-18 LAB — PROTIME-INR: Prothrombin Time: 12.9 seconds (ref 11.6–15.2)

## 2011-09-18 LAB — MAGNESIUM: Magnesium: 2 mg/dL (ref 1.5–2.5)

## 2011-09-18 LAB — APTT: aPTT: 27 seconds (ref 24–37)

## 2011-09-18 LAB — GRAM STAIN

## 2011-09-18 LAB — GLUCOSE, CAPILLARY: Glucose-Capillary: 120 mg/dL — ABNORMAL HIGH (ref 70–99)

## 2011-09-18 LAB — RAPID URINE DRUG SCREEN, HOSP PERFORMED: Barbiturates: NOT DETECTED

## 2011-09-18 LAB — PROTEIN AND GLUCOSE, CSF: Glucose, CSF: 65 mg/dL (ref 43–76)

## 2011-09-18 MED ORDER — OXYCODONE-ACETAMINOPHEN 5-325 MG PO TABS
1.0000 | ORAL_TABLET | Freq: Four times a day (QID) | ORAL | Status: DC | PRN
  Administered 2011-09-18 – 2011-09-20 (×3): 2 via ORAL
  Filled 2011-09-18: qty 2
  Filled 2011-09-18: qty 1
  Filled 2011-09-18 (×2): qty 2

## 2011-09-18 MED ORDER — HYDROCHLOROTHIAZIDE 25 MG PO TABS
25.0000 mg | ORAL_TABLET | Freq: Every day | ORAL | Status: DC
  Administered 2011-09-18: 25 mg via ORAL
  Filled 2011-09-18: qty 1

## 2011-09-18 MED ORDER — POTASSIUM CHLORIDE 20 MEQ/15ML (10%) PO LIQD
40.0000 meq | Freq: Once | ORAL | Status: AC
  Administered 2011-09-18: 40 meq via ORAL
  Filled 2011-09-18: qty 30

## 2011-09-18 MED ORDER — HYDRALAZINE HCL 20 MG/ML IJ SOLN
10.0000 mg | INTRAMUSCULAR | Status: DC | PRN
  Administered 2011-09-18: 10 mg via INTRAVENOUS
  Filled 2011-09-18: qty 0.5
  Filled 2011-09-18: qty 1

## 2011-09-18 MED ORDER — LABETALOL HCL 200 MG PO TABS
400.0000 mg | ORAL_TABLET | Freq: Two times a day (BID) | ORAL | Status: DC
  Administered 2011-09-18 – 2011-09-21 (×7): 400 mg via ORAL
  Filled 2011-09-18 (×8): qty 2

## 2011-09-18 MED ORDER — AMLODIPINE BESYLATE 10 MG PO TABS
10.0000 mg | ORAL_TABLET | Freq: Every day | ORAL | Status: DC
  Administered 2011-09-18 – 2011-09-21 (×4): 10 mg via ORAL
  Filled 2011-09-18 (×4): qty 1

## 2011-09-18 MED ORDER — LEVALBUTEROL HCL 0.63 MG/3ML IN NEBU
0.6300 mg | INHALATION_SOLUTION | Freq: Four times a day (QID) | RESPIRATORY_TRACT | Status: DC
  Administered 2011-09-18 – 2011-09-21 (×14): 0.63 mg via RESPIRATORY_TRACT
  Filled 2011-09-18 (×18): qty 3

## 2011-09-18 MED ORDER — LEVALBUTEROL HCL 0.63 MG/3ML IN NEBU
0.6300 mg | INHALATION_SOLUTION | Freq: Four times a day (QID) | RESPIRATORY_TRACT | Status: DC | PRN
  Administered 2011-09-18: 0.63 mg via RESPIRATORY_TRACT
  Filled 2011-09-18: qty 3

## 2011-09-18 MED ORDER — BUDESONIDE 0.5 MG/2ML IN SUSP
0.5000 mg | Freq: Two times a day (BID) | RESPIRATORY_TRACT | Status: DC
  Administered 2011-09-18 – 2011-09-21 (×7): 0.5 mg via RESPIRATORY_TRACT
  Filled 2011-09-18 (×11): qty 2

## 2011-09-18 MED ORDER — MONTELUKAST SODIUM 10 MG PO TABS
10.0000 mg | ORAL_TABLET | Freq: Every day | ORAL | Status: DC
  Administered 2011-09-19: 10 mg via ORAL
  Filled 2011-09-18 (×3): qty 1

## 2011-09-18 MED ORDER — POTASSIUM CHLORIDE CRYS ER 10 MEQ PO TBCR
10.0000 meq | EXTENDED_RELEASE_TABLET | Freq: Every day | ORAL | Status: DC
  Administered 2011-09-19: 10 meq via ORAL
  Filled 2011-09-18: qty 1

## 2011-09-18 MED ORDER — HYDROCHLOROTHIAZIDE 25 MG PO TABS
50.0000 mg | ORAL_TABLET | Freq: Every day | ORAL | Status: DC
  Administered 2011-09-19: 50 mg via ORAL
  Filled 2011-09-18: qty 1

## 2011-09-18 MED ORDER — ACETAMINOPHEN 325 MG PO TABS
650.0000 mg | ORAL_TABLET | ORAL | Status: DC | PRN
  Administered 2011-09-18: 650 mg via ORAL
  Filled 2011-09-18: qty 2

## 2011-09-18 NOTE — H&P (Signed)
NIGEL WESSMAN is an 36 y.o. female.   Oncologist - Dr.Magrinath.  Chief Complaint: Right eye vision loss. HPI: 36 year-old female with history of breast cancer and hypertension and bronchial asthma has been experiencing visual loss in the right eye since Sunday, that is almost 3 days ago now. She woke up with vision loss and a feeling of right eye swollen. She did not have any pain in the eyes or any discharge or any trauma. Denies any headache or any neck pain fever or chills. She had gone to her oncologist yesterday because of the persistent nature of symptoms. The oncologist had referred her to ophthalmologist immediately. The ophthalmologist on exam found her to have bilateral papilledema with 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 After which patient had gone back to the oncologist's office. They're patient was found to be having high blood pressure. Patient had ran out of her medications for last 4 days. Patient has been referred to the ER for further management. In the ER patient had MRI of the brain which does not show anything acute. Patient did not have any loss of function of her upper or lower extremities. Denies any difficulty swallowing or speaking. There is no visual problem in the left eye. Denies any difficulty ambulating. Patient in addition has shortness of breath and wheezing which has been going on for long time but worsened past few days. Denies any productive cough chest pain.  Past Medical History  Diagnosis Date  . Breast cancer 2010  . Asthma   . Hypertension   . GERD (gastroesophageal reflux disease)   . Impaired glucose tolerance 04/13/2011  . Anemia, unspecified 04/13/2011  . Asthma 09/27/2010  . Bloating 03/18/2011  . 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 Lymphadenitis, chronic     restricted LEFT extremity    Past Surgical History  Procedure Date  . Hand surgery   . Breast lumpectomy 09/2008  . Carpal tunnel release     left hand  . Tubal ligation 05/11/2011    Procedure:  POST PARTUM TUBAL LIGATION;  Surgeon: Scheryl Darter, MD;  Location: [facility];  Service: Gynecology;  Laterality: Bilateral;  Induced for HTN    Family History  Problem Relation Age of Onset  . Diabetes Maternal Grandmother   . Heart disease Maternal Grandmother    Social History:  reports that she has never smoked. She has never used smokeless tobacco. She reports that she does not drink alcohol or use illicit drugs.  Allergies:  Allergies  Allergen Reactions  . Aspirin Shortness Of Breath and Palpitations    Pt can take ibuprofen without reaction  . Penicillins Shortness Of Breath and Palpitations     (Not in a hospital admission)  Results for orders placed during the hospital encounter of 09/17/11 (from the past 48 hour(s))  CBC     Status: Normal   Collection Time   09/17/11  8:50 PM      Component Value Range Comment   WBC 5.1  4.0 - 10.5 K/uL    RBC 4.64  3.87 - 5.11 MIL/uL    Hemoglobin 14.6  12.0 - 15.0 g/dL    HCT 16.1  09.6 - 04.5 %    MCV 89.0  78.0 - 100.0 fL    MCH 31.5  26.0 - 34.0 pg    MCHC 35.4  30.0 - 36.0 g/dL    RDW 40.9  81.1 - 91.4 %    Platelets 204  150 - 400 K/uL  DIFFERENTIAL     Status: Normal   Collection Time   09/17/11  8:50 PM      Component Value Range Comment   Neutrophils Relative 53  43 - 77 %    Neutro Abs 2.7  1.7 - 7.7 K/uL    Lymphocytes Relative 35  12 - 46 %    Lymphs Abs 1.8  0.7 - 4.0 K/uL    Monocytes Relative 7  3 - 12 %    Monocytes Absolute 0.4  0.1 - 1.0 K/uL    Eosinophils Relative 4  0 - 5 %    Eosinophils Absolute 0.2  0.0 - 0.7 K/uL    Basophils Relative 1  0 - 1 %    Basophils Absolute 0.0  0.0 - 0.1 K/uL   BASIC METABOLIC PANEL     Status: Abnormal   Collection Time   09/17/11  8:50 PM      Component Value Range Comment   Sodium 137  135 - 145 mEq/L    Potassium 3.0 (*) 3.5 - 5.1 mEq/L    Chloride 100  96 - 112 mEq/L    CO2 28  19 - 32 mEq/L    Glucose, Bld 128 (*) 70 - 99 mg/dL    BUN 9  6 - 23 mg/dL     Creatinine, Ser 1.61  0.50 - 1.10 mg/dL    Calcium 9.4  8.4 - 09.6 mg/dL    GFR calc non Af Amer >90  >90 mL/min    GFR calc Af Amer >90  >90 mL/min   URINALYSIS, ROUTINE W REFLEX MICROSCOPIC     Status: Abnormal   Collection Time   09/17/11  9:31 PM      Component Value Range Comment   Color, Urine RED (*) YELLOW BIOCHEMICALS MAY BE AFFECTED BY COLOR   APPearance CLOUDY (*) CLEAR    Specific Gravity, Urine 1.024  1.005 - 1.030    pH 6.0  5.0 - 8.0    Glucose, UA NEGATIVE  NEGATIVE mg/dL    Hgb urine dipstick LARGE (*) NEGATIVE    Bilirubin Urine NEGATIVE  NEGATIVE    Ketones, ur TRACE (*) NEGATIVE mg/dL    Protein, ur 045 (*) NEGATIVE mg/dL    Urobilinogen, UA 1.0  0.0 - 1.0 mg/dL    Nitrite NEGATIVE  NEGATIVE    Leukocytes, UA TRACE (*) NEGATIVE   PREGNANCY, URINE     Status: Normal   Collection Time   09/17/11  9:31 PM      Component Value Range Comment   Preg Test, Ur NEGATIVE  NEGATIVE   URINE MICROSCOPIC-ADD ON     Status: Normal   Collection Time   09/17/11  9:31 PM      Component Value Range Comment   Squamous Epithelial / LPF RARE  RARE    WBC, UA 0-2  <3 WBC/hpf    RBC / HPF TOO NUMEROUS TO COUNT  <3 RBC/hpf    Bacteria, UA RARE  RARE    Urine-Other MICROSCOPIC EXAM PERFORMED ON UNCONCENTRATED URINE      Mr Brain Wo Contrast  09/17/2011  *RADIOLOGY REPORT*  Clinical Data: 36 year old female with 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  Hypertension.  History of breast cancer.  MRI HEAD WITHOUT CONTRAST  Technique:  Multiplanar, multiecho pulse sequences of the brain and surrounding structures were obtained according to standard protocol without intravenous contrast.  Comparison: Brain MRI without and with contrast 08/24/2010.  Findings: Stable and  normal cerebral volume. No restricted diffusion to suggest acute infarction.  No midline shift, mass effect, evidence of mass lesion, ventriculomegaly, extra-axial collection or acute intracranial hemorrhage.  Cervicomedullary junction and  pituitary are within normal limits.  Major intracranial vascular flow voids are stable.  Stable gray and white matter signal throughout the brain; normal except for occasional nonspecific small foci of T2 and FLAIR hyperintensity in the cerebral white matter, which were better demonstrated on the prior exam due to a degree of motion artifact today despite repeated imaging attempts.  Stable bone marrow signal.  Stable and negative visualized cervical spine.  Visualized paranasal sinuses and mastoids are clear. Negative scalp soft tissues.  Visualized orbit soft tissues are within normal limits.  IMPRESSION: No acute intracranial abnormality.  Stable noncontrast MRI appearance of the brain.  Original Report Authenticated By: Harley Hallmark, M.D.    Review of Systems  Constitutional: Negative.   HENT: Negative.   Eyes:       Vision loss in the right eye.  Respiratory: Positive for shortness of breath and wheezing.   Cardiovascular: Negative.   Gastrointestinal: Negative.   Genitourinary: Negative.   Musculoskeletal: Negative.   Skin: Negative.   Neurological: Negative.   Endo/Heme/Allergies: Negative.   Psychiatric/Behavioral: Negative.     Blood pressure 162/80, pulse 95, temperature 98.6 F (37 C), temperature source Oral, resp. rate 23, SpO2 99.00%, not currently breastfeeding. Physical Exam  Constitutional: She is oriented to person, place, and time. She appears well-developed and well-nourished. No distress.  HENT:  Head: Normocephalic and atraumatic.  Right Ear: External ear normal.  Left Ear: External ear normal.  Nose: Nose normal.  Mouth/Throat: Oropharynx is clear and moist. No oropharyngeal exudate.  Eyes:       Poor vision in the right eye.Left eye patient is able to see well. No limitation in eye movement.  Neck: Normal range of motion. Neck supple.  Cardiovascular: Normal rate and regular rhythm.   Respiratory: Effort normal. No respiratory distress. She has wheezes. She  has no rales.  GI: Soft. Bowel sounds are normal. She exhibits no distension. There is no tenderness. There is no rebound.  Musculoskeletal: Normal range of motion. She exhibits no edema and no tenderness.  Neurological: She is alert and oriented to person, place, and time.       Moves upper and lower extremities 5/5. No facial assymetry. Tongue is midline.  Skin: Skin is warm and dry. No rash noted. She is not diaphoretic. No erythema.  Psychiatric: Her behavior is normal.     Assessment/Plan #1. Right-sided visual loss with bilateral papilledema - have discussed with on-call neurologist Dr.Myan. At this time the primary concern is pseudotumor cerebri versus viral etiology. Dr.Mayan will be seeing patient in consult. Lumbar puncture with opening pressure checks and cervix found and cultures has been requested to be done through interventional radiologist. Patient will be placed on neurochecks. #2. Malignant hypertension - patient's blood pressure improved with one dose of IV labetalol. Patient will be continued with a home medications and will be placed on when necessary IV hydralazine for systolic blood pressure more than 160. #3. Asthma exacerbation - I have placed patient on Xopenex as patient easily gets tachycardic. Also placed on Pulmicort. #4. History of breast cancer status post lumpectomy radiation and chemotherapy - as per oncologist.  CODE STATUS - full code.  Gale Hulse N. 09/18/2011, 1:14 AM

## 2011-09-18 NOTE — Telephone Encounter (Signed)
lmonvm adviisng the pt of her appt on 09/25/2011 with the pa

## 2011-09-18 NOTE — Progress Notes (Signed)
   CARE MANAGEMENT NOTE 09/18/2011  Patient:  Erica [name], Erica [name]   Account Number:  [id number]  Date Initiated:  09/18/2011  Documentation initiated by:  Jiles Crocker  Subjective/Objective Assessment:   ADMITTED WITH MALIGNANT HTN, VISION LOSS     Action/Plan:   Oncologist - Dr.Magrinat.  INDEPENDENT PRIOR TO ADMISSION   Anticipated DC Date:  09/21/2011   Anticipated DC Plan:  HOME/SELF CARE  In-house referral  Financial Counselor      DC Planning Services  CM consult             Status of service:  In process, will continue to follow Medicare Important Message given?   (If response is "NO", the following Medicare IM given date fields will be blank)    Per UR Regulation:  Reviewed for med. necessity/level of care/duration of stay   Comments:  09/18/2011- B Ethelda Deangelo RN, BSN, MHA

## 2011-09-18 NOTE — Procedures (Signed)
LP Procedure Note:  Patient has been seen and examined.  Chart has been reviewed.  LP is being performed to evaluate for bilateral papilledema.  Procedure has been explained to patient/family including risks and benefits.  Consent has been signed by patient and witnessed.   Blood pressure 149/95, pulse 89, temperature 97.8 F (36.6 C), temperature source Oral, resp. rate 16, height 5\' 5"  (1.651 m), weight 200 lb (90.719 kg), SpO2 98.00%, not currently breastfeeding.  Current facility-administered medications:acetaminophen (TYLENOL) tablet 650 mg, 650 mg, Oral, Q4H PRN, Eduard Clos, MD;  amLODipine (NORVASC) tablet 10 mg, 10 mg, Oral, Daily, Eduard Clos, MD, 10 mg at 09/18/11 1026;  budesonide (PULMICORT) nebulizer solution 0.5 mg, 0.5 mg, Nebulization, BID, Eduard Clos, MD, 0.5 mg at 09/18/11 1610 hydrALAZINE (APRESOLINE) injection 10 mg, 10 mg, Intravenous, Q4H PRN, Eduard Clos, MD, 10 mg at 09/18/11 1229;  hydrochlorothiazide (HYDRODIURIL) tablet 50 mg, 50 mg, Oral, Daily, Simbiso Ranga, MD;  labetalol (NORMODYNE) tablet 400 mg, 400 mg, Oral, BID, Eduard Clos, MD, 400 mg at 09/18/11 1025;  labetalol (NORMODYNE,TRANDATE) injection 20 mg, 20 mg, Intravenous, Once, Rolan Bucco, MD, 20 mg at 09/17/11 2103 levalbuterol (XOPENEX) nebulizer solution 0.63 mg, 0.63 mg, Nebulization, Q6H, Eduard Clos, MD, 0.63 mg at 09/18/11 1449;  levalbuterol (XOPENEX) nebulizer solution 0.63 mg, 0.63 mg, Nebulization, Q6H PRN, Eduard Clos, MD;  LORazepam (ATIVAN) injection 1 mg, 1 mg, Intravenous, Once, Rolan Bucco, MD, 1 mg at 09/17/11 2139;  montelukast (SINGULAIR) tablet 10 mg, 10 mg, Oral, QHS, Eduard Clos, MD ondansetron [facility]) injection 4 mg, 4 mg, Intravenous, Once, Rolan Bucco, MD, 4 mg at 09/17/11 2303;  oxyCODONE-acetaminophen (PERCOCET) 5-325 MG per tablet 1-2 tablet, 1-2 tablet, Oral, Q6H PRN, Eduard Clos, MD;  potassium chloride  (K-DUR,KLOR-CON) CR tablet 10 mEq, 10 mEq, Oral, Daily, Simbiso Ranga, MD;  potassium chloride 20 MEQ/15ML (10%) liquid 40 mEq, 40 mEq, Oral, Once, Simbiso Ranga, MD, 40 mEq at 09/18/11 1024 potassium chloride SA (K-DUR,KLOR-CON) 20 MEQ CR tablet, , , , ;  potassium chloride SA (K-DUR,KLOR-CON) CR tablet 40 mEq, 40 mEq, Oral, Once, Rolan Bucco, MD, 40 mEq at 09/17/11 2303;  DISCONTD: albuterol (PROVENTIL HFA;VENTOLIN HFA) 108 (90 BASE) MCG/ACT inhaler 2 puff, 2 puff, Inhalation, Q4H PRN, Rolan Bucco, MD, 2 puff at 09/17/11 2039 DISCONTD: hydrochlorothiazide (HYDRODIURIL) tablet 25 mg, 25 mg, Oral, Daily, Eduard Clos, MD, 25 mg at 09/18/11 1026   Basename 09/18/11 0930 09/18/11 0530 09/17/11 2050  WBC -- 6.0 5.1  HGB -- 14.3 14.6  HCT -- 41.2 41.3  PLT -- 214 204  INR 0.95 -- --  PTT -- -- --    CT of head:  Patient was placed in the sitting position.  Area was cleaned with betadine and anesthetized with lidocaine.  Under sterile conditions 20G LP needle was placed at approximately L3-4 without difficulty.  Opening pressure was documented at 43 in the seated position.  Approximately 32 cc of clear, colorless fluid was obtained and sent for studies.  Patient complained of headache after the procedure.  No other complications noted.  Was left in the lying position.    Thana Farr, MD [facility] [phone] 09/18/2011  2:59 PM

## 2011-09-18 NOTE — Consult Note (Addendum)
Reason for Consult:Papilledema Referring Physician: Belfi  CC: Decreased right eye vision  HPI: Erica [name] is an 36 y.o. female who reports awakening on the 16th and noting that her right eye was closed shut and when she opened it she had poor vision from her right eye.  She also noted that colors were altered from her right eye.  What looked red from her left eye would look orange from her right.  When her symptoms did not resolve she saw her oncologist who referred her to a ophthalmologist.  Examination revealed bilateral papilledema and a field cut from her right eye.  Patient was referred to the ED at that time.  BP was elevated.  MR imaging was performed and showed no abnormalities.  Patient has a history of beast cancer.  She was treated with two rounds of chemotherapy.  She can not remember the names but reports that her last treatment was about a year ago.  She did experience a neuropathy as a side effect.    Past Medical History  Diagnosis Date  . Breast cancer 2010  . Asthma   . Hypertension   . GERD (gastroesophageal reflux disease)   . Impaired glucose tolerance 04/13/2011  . Anemia, unspecified 04/13/2011  . Asthma 09/27/2010  . Bloating 03/18/2011  . 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 Lymphadenitis, chronic     restricted LEFT extremity    Past Surgical History  Procedure Date  . Hand surgery   . Breast lumpectomy 09/2008  . Carpal tunnel release     left hand  . Tubal ligation 05/11/2011    Procedure: POST PARTUM TUBAL LIGATION;  Surgeon: Scheryl Darter, MD;  Location: [facility];  Service: Gynecology;  Laterality: Bilateral;  Induced for HTN    Family History  Problem Relation Age of Onset  . Diabetes Maternal Grandmother   . Heart disease Maternal Grandmother     Social History:  reports that she has never smoked. She has never used smokeless tobacco. She reports that she does not drink alcohol or use illicit drugs.  Allergies  Allergen Reactions  . Aspirin  Shortness Of Breath and Palpitations    Pt can take ibuprofen without reaction  . Penicillins Shortness Of Breath and Palpitations    Medications:  I have reviewed the patient's current medications. Prior to Admission:  Prescriptions prior to admission  Medication Sig Dispense Refill  . albuterol (PROVENTIL) (2.5 MG/3ML) 0.083% nebulizer solution Take 3 mLs (2.5 mg total) by nebulization every 4 (four) hours as needed.  75 mL  1  . amLODipine (NORVASC) 10 MG tablet Take 1 tablet (10 mg total) by mouth daily.  30 tablet  1  . Fluticasone-Salmeterol (ADVAIR) 250-50 MCG/DOSE AEPB Inhale 1 puff into the lungs every 12 (twelve) hours.  60 each  1  . hydrochlorothiazide (HYDRODIURIL) 25 MG tablet Take 1 tablet (25 mg total) by mouth daily.  20 tablet  0  . ipratropium (ATROVENT) 0.02 % nebulizer solution Take 2.5 mLs (500 mcg total) by nebulization 2 (two) times daily as needed. For shortness of breath  75 mL  1  . labetalol (NORMODYNE) 200 MG tablet Take 2 tablets (400 mg total) by mouth 2 (two) times daily.  120 tablet  1  . Mometasone Furo-Formoterol Fum (DULERA) 200-5 MCG/ACT AERO Inhale 2 puffs into the lungs 2 (two) times daily.  1 Inhaler  6  . montelukast (SINGULAIR) 10 MG tablet Take 1 tablet (10 mg total) by mouth at bedtime.  30 tablet  1  . oxyCODONE-acetaminophen (ROXICET) 5-325 MG per tablet Take 1-2 tablets by mouth every 6 (six) hours as needed for pain.  25 tablet  0   Scheduled:   . amLODipine  10 mg Oral Daily  . budesonide  0.5 mg Nebulization BID  . hydrochlorothiazide  25 mg Oral Daily  . labetalol  400 mg Oral BID  . labetalol  20 mg Intravenous Once  . levalbuterol  0.63 mg Nebulization Q6H  . LORazepam  1 mg Intravenous Once  . montelukast  10 mg Oral QHS  . ondansetron (ZOFRAN) IV  4 mg Intravenous Once  . potassium chloride  40 mEq Oral Once  . potassium chloride SA      . potassium chloride  40 mEq Oral Once    ROS: History obtained from the  patient  General ROS: negative for - chills, fatigue, fever, night sweats, weight gain or weight loss Psychological ROS: negative for - behavioral disorder, hallucinations, memory difficulties, mood swings or suicidal ideation Ophthalmic ROS: as noted in HPI ENT ROS: negative for - epistaxis, nasal discharge, oral lesions, sore throat, tinnitus or vertigo Allergy and Immunology ROS: negative for - hives or itchy/watery eyes Hematological and Lymphatic ROS: negative for - bleeding problems, bruising or swollen lymph nodes Endocrine ROS: negative for - galactorrhea, hair pattern changes, polydipsia/polyuria or temperature intolerance Respiratory ROS: negative for - cough, hemoptysis, shortness of breath or wheezing Cardiovascular ROS: negative for - chest pain, dyspnea on exertion, edema or irregular heartbeat Gastrointestinal ROS: negative for - abdominal pain, diarrhea, hematemesis, nausea/vomiting or stool incontinence Genito-Urinary ROS: negative for - dysuria, hematuria, incontinence or urinary frequency/urgency Musculoskeletal ROS: negative for - joint swelling or muscular weakness Neurological ROS: as noted in HPI Dermatological ROS: negative for rash and skin lesion changes  Physical Examination: Blood pressure 149/95, pulse 89, temperature 97.8 F (36.6 C), temperature source Oral, resp. rate 16, height 5\' 5"  (1.651 m), weight 200 lb (90.719 kg), SpO2 98.00%, not currently breastfeeding.  Neurologic Examination Mental Status: Alert, oriented, thought content appropriate.  Speech fluent without evidence of aphasia.  Able to follow 3 step commands without difficulty. Cranial Nerves: II: decreased right lateral peripheral visual field from the right eye, pupils equal, round, reactive to light and accommodation III,IV, VI: ptosis not present, extra-ocular motions intact bilaterally V,VII: decrease in the right NLF, facial light touch sensation normal bilaterally VIII: hearing normal  bilaterally IX,X: gag reflex present XI: trapezius strength/neck flexion strength normal bilaterally XII: tongue strength normal  Motor: Right : Upper extremity   5/5    Left:     Upper extremity   5/5  Lower extremity   5/5     Lower extremity   5/5 Tone and bulk:normal tone throughout; no atrophy noted Sensory: Pinprick and light touch intact throughout, bilaterally Deep Tendon Reflexes: 1+ in the upper extremities, absent at the knees and 1+ at the ankles. Plantars: Right: mute   Left: mute Cerebellar: normal finger-to-nose and normal heel-to-shin test   Results for orders placed during the hospital encounter of 09/17/11 (from the past 48 hour(s))  CBC     Status: Normal   Collection Time   09/17/11  8:50 PM      Component Value Range Comment   WBC 5.1  4.0 - 10.5 K/uL    RBC 4.64  3.87 - 5.11 MIL/uL    Hemoglobin 14.6  12.0 - 15.0 g/dL    HCT 54.0  98.1 - 19.1 %  MCV 89.0  78.0 - 100.0 fL    MCH 31.5  26.0 - 34.0 pg    MCHC 35.4  30.0 - 36.0 g/dL    RDW 16.1  09.6 - 04.5 %    Platelets 204  150 - 400 K/uL   DIFFERENTIAL     Status: Normal   Collection Time   09/17/11  8:50 PM      Component Value Range Comment   Neutrophils Relative 53  43 - 77 %    Neutro Abs 2.7  1.7 - 7.7 K/uL    Lymphocytes Relative 35  12 - 46 %    Lymphs Abs 1.8  0.7 - 4.0 K/uL    Monocytes Relative 7  3 - 12 %    Monocytes Absolute 0.4  0.1 - 1.0 K/uL    Eosinophils Relative 4  0 - 5 %    Eosinophils Absolute 0.2  0.0 - 0.7 K/uL    Basophils Relative 1  0 - 1 %    Basophils Absolute 0.0  0.0 - 0.1 K/uL   BASIC METABOLIC PANEL     Status: Abnormal   Collection Time   09/17/11  8:50 PM      Component Value Range Comment   Sodium 137  135 - 145 mEq/L    Potassium 3.0 (*) 3.5 - 5.1 mEq/L    Chloride 100  96 - 112 mEq/L    CO2 28  19 - 32 mEq/L    Glucose, Bld 128 (*) 70 - 99 mg/dL    BUN 9  6 - 23 mg/dL    Creatinine, Ser 4.09  0.50 - 1.10 mg/dL    Calcium 9.4  8.4 - 81.1 mg/dL    GFR  calc non Af Amer >90  >90 mL/min    GFR calc Af Amer >90  >90 mL/min   URINALYSIS, ROUTINE W REFLEX MICROSCOPIC     Status: Abnormal   Collection Time   09/17/11  9:31 PM      Component Value Range Comment   Color, Urine RED (*) YELLOW BIOCHEMICALS MAY BE AFFECTED BY COLOR   APPearance CLOUDY (*) CLEAR    Specific Gravity, Urine 1.024  1.005 - 1.030    pH 6.0  5.0 - 8.0    Glucose, UA NEGATIVE  NEGATIVE mg/dL    Hgb urine dipstick LARGE (*) NEGATIVE    Bilirubin Urine NEGATIVE  NEGATIVE    Ketones, ur TRACE (*) NEGATIVE mg/dL    Protein, ur 914 (*) NEGATIVE mg/dL    Urobilinogen, UA 1.0  0.0 - 1.0 mg/dL    Nitrite NEGATIVE  NEGATIVE    Leukocytes, UA TRACE (*) NEGATIVE   PREGNANCY, URINE     Status: Normal   Collection Time   09/17/11  9:31 PM      Component Value Range Comment   Preg Test, Ur NEGATIVE  NEGATIVE   URINE MICROSCOPIC-ADD ON     Status: Normal   Collection Time   09/17/11  9:31 PM      Component Value Range Comment   Squamous Epithelial / LPF RARE  RARE    WBC, UA 0-2  <3 WBC/hpf    RBC / HPF TOO NUMEROUS TO COUNT  <3 RBC/hpf    Bacteria, UA RARE  RARE    Urine-Other MICROSCOPIC EXAM PERFORMED ON UNCONCENTRATED URINE     COMPREHENSIVE METABOLIC PANEL     Status: Abnormal   Collection Time   09/18/11  5:30 AM  Component Value Range Comment   Sodium 134 (*) 135 - 145 mEq/L    Potassium 3.5  3.5 - 5.1 mEq/L    Chloride 100  96 - 112 mEq/L    CO2 23  19 - 32 mEq/L    Glucose, Bld 106 (*) 70 - 99 mg/dL    BUN 11  6 - 23 mg/dL    Creatinine, Ser 4.09  0.50 - 1.10 mg/dL    Calcium 9.1  8.4 - 81.1 mg/dL    Total Protein 6.7  6.0 - 8.3 g/dL    Albumin 3.4 (*) 3.5 - 5.2 g/dL    AST 23  0 - 37 U/L    ALT 31  0 - 35 U/L    Alkaline Phosphatase 50  39 - 117 U/L    Total Bilirubin 0.4  0.3 - 1.2 mg/dL    GFR calc non Af Amer 84 (*) >90 mL/min    GFR calc Af Amer >90  >90 mL/min   CBC     Status: Normal   Collection Time   09/18/11  5:30 AM      Component Value  Range Comment   WBC 6.0  4.0 - 10.5 K/uL    RBC 4.59  3.87 - 5.11 MIL/uL    Hemoglobin 14.3  12.0 - 15.0 g/dL    HCT 91.4  78.2 - 95.6 %    MCV 89.8  78.0 - 100.0 fL    MCH 31.2  26.0 - 34.0 pg    MCHC 34.7  30.0 - 36.0 g/dL    RDW 21.3  08.6 - 57.8 %    Platelets 214  150 - 400 K/uL   LIPID PANEL     Status: Normal   Collection Time   09/18/11  5:30 AM      Component Value Range Comment   Cholesterol 153  0 - 200 mg/dL    Triglycerides 469  <629 mg/dL    HDL 43  >52 mg/dL    Total CHOL/HDL Ratio 3.6      VLDL 27  0 - 40 mg/dL    LDL Cholesterol 83  0 - 99 mg/dL   URINE RAPID DRUG SCREEN (HOSP PERFORMED)     Status: Normal   Collection Time   09/18/11  7:03 AM      Component Value Range Comment   Opiates NONE DETECTED  NONE DETECTED    Cocaine NONE DETECTED  NONE DETECTED    Benzodiazepines NONE DETECTED  NONE DETECTED    Amphetamines NONE DETECTED  NONE DETECTED    Tetrahydrocannabinol NONE DETECTED  NONE DETECTED    Barbiturates NONE DETECTED  NONE DETECTED   GLUCOSE, CAPILLARY     Status: Abnormal   Collection Time   09/18/11  7:51 AM      Component Value Range Comment   Glucose-Capillary 120 (*) 70 - 99 mg/dL   PROTIME-INR     Status: Normal   Collection Time   09/18/11  9:30 AM      Component Value Range Comment   Prothrombin Time 12.9  11.6 - 15.2 seconds    INR 0.95  0.00 - 1.49   APTT     Status: Normal   Collection Time   09/18/11  9:30 AM      Component Value Range Comment   aPTT 27  24 - 37 seconds   MAGNESIUM     Status: Normal   Collection Time   09/18/11  9:30 AM  Component Value Range Comment   Magnesium 2.0  1.5 - 2.5 mg/dL   CSF CELL COUNT WITH DIFFERENTIAL     Status: Abnormal (Preliminary result)   Collection Time   09/18/11  1:23 PM      Component Value Range Comment   Tube # 1      Color, CSF PENDING  COLORLESS    Appearance, CSF HAZY (*) CLEAR    Supernatant PENDING      RBC Count, CSF 269 (*) 0 /cu mm    WBC, CSF 0  0 - 5 /cu mm     Segmented Neutrophils-CSF PENDING  0 - 6 %    Lymphs, CSF PENDING  40 - 80 %    Monocyte-Macrophage-Spinal Fluid PENDING  15 - 45 %    Eosinophils, CSF PENDING  0 - 1 %    Other Cells, CSF PENDING       No results found for this or any previous visit (from the past 240 hour(s)).  Mr Brain Wo Contrast  09/17/2011  *RADIOLOGY REPORT*  Clinical Data: 36 year old female with 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  Hypertension.  History of breast cancer.  MRI HEAD WITHOUT CONTRAST  Technique:  Multiplanar, multiecho pulse sequences of the brain and surrounding structures were obtained according to standard protocol without intravenous contrast.  Comparison: Brain MRI without and with contrast 08/24/2010.  Findings: Stable and normal cerebral volume. No restricted diffusion to suggest acute infarction.  No midline shift, mass effect, evidence of mass lesion, ventriculomegaly, extra-axial collection or acute intracranial hemorrhage.  Cervicomedullary junction and pituitary are within normal limits.  Major intracranial vascular flow voids are stable.  Stable gray and white matter signal throughout the brain; normal except for occasional nonspecific small foci of T2 and FLAIR hyperintensity in the cerebral white matter, which were better demonstrated on the prior exam due to a degree of motion artifact today despite repeated imaging attempts.  Stable bone marrow signal.  Stable and negative visualized cervical spine.  Visualized paranasal sinuses and mastoids are clear. Negative scalp soft tissues.  Visualized orbit soft tissues are within normal limits.  IMPRESSION: No acute intracranial abnormality.  Stable noncontrast MRI appearance of the brain.  Original Report Authenticated By: Harley Hallmark, M.D.     Assessment/Plan:  Patient Active Hospital Problem List: Vision loss of right eye, papilledema (09/18/2011)   Assessment: Papilledema continues.  Vision issues continue as well but are stable.  Imaging shows no  mass lesions or areas of inflammation that could explain the patient's symptoms.  There are no offending medications and chemotherapy has not been recent. Although hypertensive there are no findings of PRES on MRI.   The patient is overweight though and can not rule out the possibility of a BIH.  Meningitic recurrence of cancer can not be rules out either.     Plan:  1.  LP-will perform today.  Procedure has been explained to patient and consent signed.  Fluid will be sent for cytology, lyme titer and infection work up.  2.  ESR  3.  With likelihood of BIH and increased BP would consider a diuretic for BP management that may be helpful for both.  I's and O's to be monitored.  Thana Farr, MD [facility] [phone] 09/18/2011, 2:25 PM

## 2011-09-18 NOTE — Progress Notes (Signed)
Saw and examined Erica [name] at bed side. Reviewed chart. Discussed with Dr Thad Ranger who will graciously see patient and perform LP. Appreciate heme/onc. Please see orders per Dr Christella Noa.  Erica Buccellato,MD [phone]

## 2011-09-18 NOTE — Progress Notes (Signed)
Erica [name]   DOB:28-Oct-1975   NF#:[id number]   QIO#:[id number]  Subjective:  Erica [name] is eager to get home, anxious about LP; no headache, nausea, vomiting, or dizzyness; blurred vision R eye persists ("can't focus"); friend in room   Objective: young African American woman examined sitting up in bed Filed Vitals:   09/18/11 0800  BP: 163/93  Pulse: 93  Temp: 98.4 F (36.9 C)  Resp:     There is no height or weight on file to calculate BMI. No intake or output data in the 24 hours ending 09/18/11 0901   Sclerae unicteric; decreased vision R eye unchanged from yesterday's exam  Lungs clear -- no rales or rhonchi  Heart regular rate and rhythm  Abdomen benign  MSK no peripheral edema  Neuro nonfocal   CBG (last 3)   Basename 09/18/11 0751  GLUCAP 120*     Labs:  Lab Results  Component Value Date   WBC 6.0 09/18/2011   HGB 14.3 09/18/2011   HCT 41.2 09/18/2011   MCV 89.8 09/18/2011   PLT 214 09/18/2011   NEUTROABS 2.7 09/17/2011    Urine Studies No results found for this basename: UACOL:2,UAPR:2,USPG:2,UPH:2,UTP:2,UGL:2,UKET:2,UBIL:2,UHGB:2,UNIT:2,UROB:2,ULEU:2,UEPI:2,UWBC:2,URBC:2,UBAC:2,CAST:2,CRYS:2,UCOM:2,BILUA:2 in the last 72 hours  Basic Metabolic Panel:  Lab 09/18/11 3244 09/17/11 2050  NA 134* 137  K 3.5 3.0*  CL 100 100  CO2 23 28  GLUCOSE 106* 128*  BUN 11 9  CREATININE 0.87 0.79  CALCIUM 9.1 9.4  MG -- --  PHOS -- --   GFR The CrCl is unknown because both a height and weight (above a minimum accepted value) are required for this calculation. Liver Function Tests:  Lab 09/18/11 0530  AST 23  ALT 31  ALKPHOS 50  BILITOT 0.4  PROT 6.7  ALBUMIN 3.4*   No results found for this basename: LIPASE:5,AMYLASE:5 in the last 168 hours No results found for this basename: AMMONIA:5 in the last 168 hours Coagulation profile No results found for this basename: INR:5,PROTIME:5 in the last 168 hours  CBC:  Lab 09/18/11 0530 09/17/11 2050  WBC 6.0 5.1    NEUTROABS -- 2.7  HGB 14.3 14.6  HCT 41.2 41.3  MCV 89.8 89.0  PLT 214 204   Cardiac Enzymes: No results found for this basename: CKTOTAL:5,CKMB:5,CKMBINDEX:5,TROPONINI:5 in the last 168 hours BNP: No components found with this basename: POCBNP:5 CBG:  Lab 09/18/11 0751  GLUCAP 120*   D-Dimer No results found for this basename: DDIMER:2 in the last 72 hours Hgb A1c No results found for this basename: HGBA1C:2 in the last 72 hours Lipid Profile No results found for this basename: CHOL:2,HDL:2,LDLCALC:2,TRIG:2,CHOLHDL:2,LDLDIRECT:2 in the last 72 hours Thyroid function studies No results found for this basename: TSH,T4TOTAL,FREET3,T3FREE,THYROIDAB in the last 72 hours Anemia work up No results found for this basename: VITAMINB12:2,FOLATE:2,FERRITIN:2,TIBC:2,IRON:2,RETICCTPCT:2 in the last 72 hours Microbiology No results found for this or any previous visit (from the past 240 hour(s)).    Studies:  Mr Brain Wo Contrast  09/17/2011  *RADIOLOGY REPORT*  Clinical Data: 36 year old female with 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  Hypertension.  History of breast cancer.  MRI HEAD WITHOUT CONTRAST  Technique:  Multiplanar, multiecho pulse sequences of the brain and surrounding structures were obtained according to standard protocol without intravenous contrast.  Comparison: Brain MRI without and with contrast 08/24/2010.  Findings: Stable and normal cerebral volume. No restricted diffusion to suggest acute infarction.  No midline shift, mass effect, evidence of mass lesion, ventriculomegaly, extra-axial collection or acute intracranial hemorrhage.  Cervicomedullary junction and pituitary are within normal limits.  Major intracranial vascular flow voids are stable.  Stable gray and white matter signal throughout the brain; normal except for occasional nonspecific small foci of T2 and FLAIR hyperintensity in the cerebral white matter, which were better demonstrated on the prior exam due to a  degree of motion artifact today despite repeated imaging attempts.  Stable bone marrow signal.  Stable and negative visualized cervical spine.  Visualized paranasal sinuses and mastoids are clear. Negative scalp soft tissues.  Visualized orbit soft tissues are within normal limits.  IMPRESSION: No acute intracranial abnormality.  Stable noncontrast MRI appearance of the brain.  Original Report Authenticated By: Erica [name], M.D.    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  (b) status post left lumpectomy and sentinel lymph node dissection in July 2010 for a T2 N1, stage IIB invasive ductal carcinoma, grade 3, triple negative with MIB-1 of 88%.  (c) status post full axillary dissection and margin clearance in August 2010.  (d) status post 11 doses of weekly paclitaxel completed in December 2010  (e) followed by radiation therapy completed in March 2011.   Plan: agree with planned d/c after LP if patient stable and blood pressure controlled (diastolic target 90-95); I have given patient Dr Wynona Canes McCuen's phone number [phone]) to call upon discharge so patient can have a formal visual field test and initiate ophthalmologic follow-up. Patient will see [place] June 28 at 2 PM for blood pressure monitoring and review of LP results.   Please let me know if I can be of further help   Erica [name] C 09/18/2011

## 2011-09-19 DIAGNOSIS — H4710 Unspecified papilledema: Secondary | ICD-10-CM

## 2011-09-19 DIAGNOSIS — J45901 Unspecified asthma with (acute) exacerbation: Secondary | ICD-10-CM

## 2011-09-19 DIAGNOSIS — H547 Unspecified visual loss: Secondary | ICD-10-CM

## 2011-09-19 DIAGNOSIS — G932 Benign intracranial hypertension: Secondary | ICD-10-CM

## 2011-09-19 DIAGNOSIS — H53139 Sudden visual loss, unspecified eye: Secondary | ICD-10-CM

## 2011-09-19 DIAGNOSIS — I10 Essential (primary) hypertension: Secondary | ICD-10-CM

## 2011-09-19 MED ORDER — FUROSEMIDE 10 MG/ML IJ SOLN: 40.0000 mg | Freq: Once | INTRAMUSCULAR | Status: DC

## 2011-09-19 MED ORDER — POTASSIUM CHLORIDE CRYS ER 20 MEQ PO TBCR
20.0000 meq | EXTENDED_RELEASE_TABLET | Freq: Two times a day (BID) | ORAL | Status: DC
  Administered 2011-09-19: 20 meq via ORAL
  Filled 2011-09-19 (×3): qty 1

## 2011-09-19 MED ORDER — ACETAZOLAMIDE ER 500 MG PO CP12
500.0000 mg | ORAL_CAPSULE | Freq: Two times a day (BID) | ORAL | Status: DC
  Administered 2011-09-19 – 2011-09-21 (×5): 500 mg via ORAL
  Filled 2011-09-19 (×6): qty 1

## 2011-09-19 MED ORDER — LISINOPRIL 2.5 MG PO TABS
2.5000 mg | ORAL_TABLET | Freq: Every day | ORAL | Status: DC
  Administered 2011-09-19: 2.5 mg via ORAL
  Filled 2011-09-19 (×2): qty 1

## 2011-09-19 MED ORDER — POTASSIUM CHLORIDE 20 MEQ/15ML (10%) PO LIQD
40.0000 meq | Freq: Once | ORAL | Status: DC
  Filled 2011-09-19: qty 30

## 2011-09-19 MED ORDER — ONDANSETRON HCL 4 MG/2ML IJ SOLN
4.0000 mg | Freq: Four times a day (QID) | INTRAMUSCULAR | Status: AC | PRN
  Administered 2011-09-19 (×2): 4 mg via INTRAVENOUS
  Filled 2011-09-19 (×2): qty 2

## 2011-09-19 MED ORDER — FUROSEMIDE 40 MG PO TABS
40.0000 mg | ORAL_TABLET | Freq: Two times a day (BID) | ORAL | Status: DC
  Administered 2011-09-19: 40 mg via ORAL
  Filled 2011-09-19 (×5): qty 1

## 2011-09-19 MED ORDER — FUROSEMIDE 10 MG/ML IJ SOLN
60.0000 mg | Freq: Once | INTRAMUSCULAR | Status: AC
  Administered 2011-09-19: 60 mg via INTRAVENOUS
  Filled 2011-09-19: qty 6

## 2011-09-19 NOTE — Progress Notes (Signed)
Patient is concerned about getting nebulizer for Albuterol upon discharge. Please discuss this with her before discharge. Thank you!

## 2011-09-19 NOTE — Progress Notes (Signed)
SUBJECTIVE Says she cant focus her vision right eye but this is better than before LP yesterday. She had pain right eye after the procedure yesterday.   1. Hypertensive emergency   2. Vision loss   3. Hypokalemia   4. Benign essential hypertension antepartum   5. Late prenatal care   6. Unspecified hypertension antepartum   7. Suspected damage to fetus from other disease in mother, affecting management of mother, antepartum condition or complication   8. Breast cancer     Past Medical History  Diagnosis Date  . Breast cancer 2010  . Asthma   . Hypertension   . GERD (gastroesophageal reflux disease)   . Impaired glucose tolerance 04/13/2011  . Anemia, unspecified 04/13/2011  . Asthma 09/27/2010  . Bloating 03/18/2011  . 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 Lymphadenitis, chronic     restricted LEFT extremity   Current Facility-Administered Medications  Medication Dose Route Frequency Provider Last Rate Last Dose  . acetaminophen (TYLENOL) tablet 650 mg  650 mg Oral Q4H PRN Eduard Clos, MD   650 mg at 09/18/11 1534  . acetaZOLAMIDE (DIAMOX) 12 hr capsule 500 mg  500 mg Oral Q12H Thana Farr, MD      . amLODipine (NORVASC) tablet 10 mg  10 mg Oral Daily Eduard Clos, MD   10 mg at 09/19/11 0856  . budesonide (PULMICORT) nebulizer solution 0.5 mg  0.5 mg Nebulization BID Eduard Clos, MD   0.5 mg at 09/19/11 0816  . furosemide (LASIX) injection 60 mg  60 mg Intravenous Once Eiko Mcgowen, MD      . furosemide (LASIX) tablet 40 mg  40 mg Oral BID Dearius Hoffmann, MD      . hydrALAZINE (APRESOLINE) injection 10 mg  10 mg Intravenous Q4H PRN Eduard Clos, MD   10 mg at 09/18/11 1229  . labetalol (NORMODYNE) tablet 400 mg  400 mg Oral BID Eduard Clos, MD   400 mg at 09/19/11 0856  . levalbuterol (XOPENEX) nebulizer solution 0.63 mg  0.63 mg Nebulization Q6H Eduard Clos, MD   0.63 mg at 09/19/11 0816  . levalbuterol (XOPENEX) nebulizer solution  0.63 mg  0.63 mg Nebulization Q6H PRN Eduard Clos, MD   0.63 mg at 09/18/11 2347  . lisinopril (PRINIVIL,ZESTRIL) tablet 2.5 mg  2.5 mg Oral Daily Catricia Scheerer, MD      . montelukast (SINGULAIR) tablet 10 mg  10 mg Oral QHS Eduard Clos, MD      . ondansetron [facility]) injection 4 mg  4 mg Intravenous Q6H PRN Caroline More, NP   4 mg at 09/19/11 0300  . oxyCODONE-acetaminophen (PERCOCET) 5-325 MG per tablet 1-2 tablet  1-2 tablet Oral Q6H PRN Eduard Clos, MD   2 tablet at 09/19/11 0239  . potassium chloride 20 MEQ/15ML (10%) liquid 40 mEq  40 mEq Oral Once Rosielee Corporan, MD   40 mEq at 09/18/11 1024  . potassium chloride 20 MEQ/15ML (10%) liquid 40 mEq  40 mEq Oral Once Myers Tutterow, MD      . potassium chloride SA (K-DUR,KLOR-CON) CR tablet 20 mEq  20 mEq Oral BID Phaedra Colgate, MD      . DISCONTD: furosemide (LASIX) injection 40 mg  40 mg Intravenous Once Hollyanne Schloesser, MD      . DISCONTD: hydrochlorothiazide (HYDRODIURIL) tablet 25 mg  25 mg Oral Daily Eduard Clos, MD   25 mg at 09/18/11 1026  . DISCONTD: hydrochlorothiazide (  HYDRODIURIL) tablet 50 mg  50 mg Oral Daily Raneem Mendolia, MD   50 mg at 09/19/11 0856  . DISCONTD: potassium chloride (K-DUR,KLOR-CON) CR tablet 10 mEq  10 mEq Oral Daily Terrace Chiem, MD   10 mEq at 09/19/11 0856   Allergies  Allergen Reactions  . Aspirin Shortness Of Breath and Palpitations    Pt can take ibuprofen without reaction  . Penicillins Shortness Of Breath and Palpitations   Principal Problem:  *Vision loss of right eye Active Problems:  Breast cancer  Papilloedema, unspecified  Asthma exacerbation   Vital signs in last 24 hours: Temp:  [97.6 F (36.4 C)-97.9 F (36.6 C)] 97.9 F (36.6 C) (06/20 0534) Pulse Rate:  [75-94] 83  (06/20 0534) Resp:  [16-20] 16  (06/20 0534) BP: (134-171)/(75-121) 168/101 mmHg (06/20 0856) SpO2:  [94 %-99 %] 98 % (06/20 0817) Weight:  [90.719 kg (200 lb)] 90.719 kg (200 lb) (06/19  1130) Weight change:     Intake/Output from previous day: 06/19 0701 - 06/20 0700 In: 720 [P.O.:720] Out: -  Intake/Output this shift:    Lab Results:  Basename 09/18/11 0530 09/17/11 2050  WBC 6.0 5.1  HGB 14.3 14.6  HCT 41.2 41.3  PLT 214 204   BMET  Basename 09/18/11 0530 09/17/11 2050  NA 134* 137  K 3.5 3.0*  CL 100 100  CO2 23 28  GLUCOSE 106* 128*  BUN 11 9  CREATININE 0.87 0.79  CALCIUM 9.1 9.4    Studies/Results: Mr Brain Wo Contrast  09/17/2011  *RADIOLOGY REPORT*  Clinical Data: 36 year old female with 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  Hypertension.  History of breast cancer.  MRI HEAD WITHOUT CONTRAST  Technique:  Multiplanar, multiecho pulse sequences of the brain and surrounding structures were obtained according to standard protocol without intravenous contrast.  Comparison: Brain MRI without and with contrast 08/24/2010.  Findings: Stable and normal cerebral volume. No restricted diffusion to suggest acute infarction.  No midline shift, mass effect, evidence of mass lesion, ventriculomegaly, extra-axial collection or acute intracranial hemorrhage.  Cervicomedullary junction and pituitary are within normal limits.  Major intracranial vascular flow voids are stable.  Stable gray and white matter signal throughout the brain; normal except for occasional nonspecific small foci of T2 and FLAIR hyperintensity in the cerebral white matter, which were better demonstrated on the prior exam due to a degree of motion artifact today despite repeated imaging attempts.  Stable bone marrow signal.  Stable and negative visualized cervical spine.  Visualized paranasal sinuses and mastoids are clear. Negative scalp soft tissues.  Visualized orbit soft tissues are within normal limits.  IMPRESSION: No acute intracranial abnormality.  Stable noncontrast MRI appearance of the brain.  Original Report Authenticated By: Harley Hallmark, M.D.    Medications: I have reviewed the patient's  current medications.   Physical exam GENERAL- alert HEAD/eyes- limited vision right eye- can barely make out letters on the bathroom door with her right eye. No problem with the left eye.  RESPIRATORY- appears well, vitals normal, no respiratory distress, acyanotic, normal RR, ear and throat exam is normal, neck free of mass or lymphadenopathy, chest clear, no wheezing, crepitations, rhonchi, normal symmetric air entry CVS- regular rate and rhythm, S1, S2 normal, no murmur, click, rub or gallop ABDOMEN- abdomen is soft without significant tenderness, masses, organomegaly or guarding NEURO- Grossly normal EXTREMITIES- extremities normal, atraumatic, no cyanosis or edema  Plan   * Vision loss of right eye/Papilloedema, unspecified -seems better. Raised opening  lp pressure. Discussed with Dr Thad Ranger. Left a message with Dr Charlotte Sanes. Agree with diuresis. Will add lasix.  * Htn- uncontrolled. Add low dose acei. *  Breast cancer hx. dvt prophylaxis.      Grey Rakestraw 09/19/2011 10:23 AM Pager: [phone].

## 2011-09-19 NOTE — Progress Notes (Addendum)
[facility] PROGRESS NOTE    SUBJECTIVE   Patient continues to complain about blurred vision in right eye and states everything has a "orange color when looking out of her right eye".  No complaints of weakness, numbness, diplopia, HA.   OBJECTIVE   Vital signs in last 24 hours: Temp:  [97.6 F (36.4 C)-97.9 F (36.6 C)] 97.9 F (36.6 C) (06/20 0534) Pulse Rate:  [75-94] 83  (06/20 0534) Resp:  [16-20] 16  (06/20 0534) BP: (134-171)/(75-121) 134/75 mmHg (06/20 0534) SpO2:  [94 %-99 %] 98 % (06/20 0817) Weight:  [90.719 kg (200 lb)] 90.719 kg (200 lb) (06/19 1130)  Intake/Output from previous day: 06/19 0701 - 06/20 0700 In: 720 [P.O.:720] Out: -  Intake/Output this shift:   Nutritional status: Cardiac  Past Medical History  Diagnosis Date  . Breast cancer 2010  . Asthma   . Hypertension   . GERD (gastroesophageal reflux disease)   . Impaired glucose tolerance 04/13/2011  . Anemia, unspecified 04/13/2011  . Asthma 09/27/2010  . Bloating 03/18/2011  . 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 Lymphadenitis, chronic     restricted LEFT extremity    Neurologic Exam:  Mental Status: Alert, oriented, thought content appropriate.  Speech fluent without evidence of aphasia. Able to follow 3 step commands without difficulty. Cranial Nerves: II-Visual fields--when both eyes are open or just using left eye patient has no difficulty counting fingers in all visual fields.  When only looking with right eye, she has difficulty counting my fingers in all visual fields.  She also tends to hold the right eyelid closed but can hold eyelid fully open when looking at my finger with both eyes.   III/IV/VI-Extraocular movements intact.  Pupils reactive bilaterally. No ptosis noted V/VII-Smile symmetric VIII-grossly intact bilateral to both pp and light touch IX/X-normal gag XI-bilateral shoulder shrug XII-midline tongue extension Motor: 5/5  bilaterally with normal tone and bulk Sensory: Pinprick and light touch intact throughout, bilaterally Deep Tendon Reflexes: 2+ and symmetric throughout Plantars downgoing bilaterally Cerebellar: Normal finger-to-nose, normal rapid alternating movements and normal heel-to-shin test.     Lab Results:  Results for orders placed during the hospital encounter of 09/17/11 (from the past 24 hour(s))  PROTIME-INR     Status: Normal   Collection Time   09/18/11  9:30 AM      Component Value Range   Prothrombin Time 12.9  11.6 - 15.2 seconds   INR 0.95  0.00 - 1.49  APTT     Status: Normal   Collection Time   09/18/11  9:30 AM      Component Value Range   aPTT 27  24 - 37 seconds  MAGNESIUM     Status: Normal   Collection Time   09/18/11  9:30 AM      Component Value Range   Magnesium 2.0  1.5 - 2.5 mg/dL  TSH     Status: Normal   Collection Time   09/18/11  9:30 AM      Component Value Range   TSH 2.648  0.350 - 4.500 uIU/mL  VITAMIN B12     Status: Normal   Collection Time   09/18/11  9:30 AM      Component Value Range   Vitamin B-12 725  211 - 911 pg/mL  RPR     Status: Normal   Collection Time   09/18/11  9:30 AM      Component Value Range   RPR NON REACTIVE  NON REACTIVE  PROTEIN AND GLUCOSE, CSF     Status: Normal   Collection Time   09/18/11  1:23 PM      Component Value Range   Glucose, CSF 65  43 - 76 mg/dL   Total  Protein, CSF 17  15 - 45 mg/dL  CSF CELL COUNT WITH DIFFERENTIAL     Status: Abnormal   Collection Time   09/18/11  1:23 PM      Component Value Range   Tube # 1     Color, CSF COLORLESS  COLORLESS   Appearance, CSF HAZY (*) CLEAR   Supernatant NOT INDICATED     RBC Count, CSF 269 (*) 0 /cu mm   WBC, CSF 0  0 - 5 /cu mm   Segmented Neutrophils-CSF TOO FEW TO COUNT, SMEAR AVAILABLE FOR REVIEW  0 - 6 %   Other Cells, CSF NO CELLS OBSERVED    GRAM STAIN     Status: Normal   Collection Time   09/18/11  1:23 PM      Component Value Range   Specimen  Description CSF     Special Requests Normal     Gram Stain       Value: NO WBC SEEN     NO ORGANISMS SEEN     Gram Stain Report Called to,Read Back By and Verified With: [id number]   Report Status 09/18/2011 FINAL    CRYPTOCOCCAL ANTIGEN, CSF     Status: Normal   Collection Time   09/18/11  1:23 PM      Component Value Range   Crypto Ag NEGATIVE  NEGATIVE   Cryptococcal Ag Titer NOT INDICATED  NOT INDICATED  CSF CULTURE     Status: Normal (Preliminary result)   Collection Time   09/18/11  1:24 PM      Component Value Range   Specimen Description CSF     Special Requests Normal     Gram Stain       Value: CYTOSPIN NO WBC SEEN     NO SQUAMOUS EPITHELIAL CELLS SEEN     Gram Stain Report Called to,Read Back By and Verified With: Gram Stain Report Called to,Read Back By and Verified With: [facility] 1510 09/18/11 MURPHYD Performed by [facility]   Culture PENDING     Report Status PENDING    SEDIMENTATION RATE     Status: Normal   Collection Time   09/19/11  4:20 AM      Component Value Range   Sed Rate 6  0 - 22 mm/hr   Lab Results  Component Value Date/Time   CHOL 153 09/18/2011  5:30 AM   Lipid Panel  Basename 09/18/11 0530  CHOL 153  TRIG 133  HDL 43  CHOLHDL 3.6  VLDL 27  LDLCALC 83    Studies/Results: Mr Brain Wo Contrast  09/17/2011  *RADIOLOGY REPORT*  Clinical Data: 36 year old female with 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  Hypertension.  History of breast cancer.  MRI HEAD WITHOUT CONTRAST  Technique:  Multiplanar, multiecho pulse sequences of the brain and surrounding structures were obtained according to standard protocol without intravenous contrast.  Comparison: Brain MRI without and with contrast 08/24/2010.  Findings: Stable and normal cerebral volume. No restricted diffusion to suggest acute infarction.  No midline shift, mass effect, evidence of mass lesion, ventriculomegaly, extra-axial collection or acute intracranial hemorrhage.   Cervicomedullary junction and pituitary are within normal limits.  Major intracranial vascular flow voids are stable.  Stable gray and white matter signal throughout the brain; normal except for occasional nonspecific small foci of T2 and FLAIR hyperintensity in the cerebral white matter, which were better demonstrated on the prior exam due to a degree of motion artifact today despite repeated imaging attempts.  Stable bone marrow signal.  Stable and negative visualized cervical spine.  Visualized paranasal sinuses and mastoids are clear. Negative scalp soft tissues.  Visualized orbit soft tissues are within normal limits.  IMPRESSION: No acute intracranial abnormality.  Stable noncontrast MRI appearance of the brain.  Original Report Authenticated By: Harley Hallmark, M.D.    Medications:     Scheduled:   . amLODipine  10 mg Oral Daily  . budesonide  0.5 mg Nebulization BID  . hydrochlorothiazide  50 mg Oral Daily  . labetalol  400 mg Oral BID  . levalbuterol  0.63 mg Nebulization Q6H  . montelukast  10 mg Oral QHS  . potassium chloride  10 mEq Oral Daily  . potassium chloride  40 mEq Oral Once  . DISCONTD: hydrochlorothiazide  25 mg Oral Daily    Assessment/Plan:   Patient Active Hospital Problem List: Vision loss of right eye, papilledema (09/18/2011) Assessment:  Blurred vision continues but are stable and no worsening of symptoms.  LP-was performed yesterday. Opening pressure of 43, Glucose 65 and protien 17,  RBC of 269 with no WBC in tube #1, negative cryptococcal, gram stain negative and no cryptococcal AG titer.  Sed rate was 6. As stated in prior note there are no offending medications and chemotherapy has not been recent. There are no findings of PRES on MRI.   Pending  Labs: CSF AFB, Culture, fungal stain, B. Burgdorfi, cytology    Plan:   1. Continue to recommend BP management  as recent BP continues to range from 171-134/121-80.  2. Aggressive diuresis as patients I and O's  continue to be +720 since admission.     Felicie Morn PA-C [facility] [phone]  09/19/2011, 8:36 AM    Patient seen and evaluated.  Reports vision is unchanged.  On field testing slightly less peripheral vision impairment from right eye.  Have discussed case at length with Dr. Venetia Constable.  With stable vision would continue with aggressive diuresis.  Lasix increased.  Diamox added to regimen as well.  Ophthalmology to see patient today.  Would not re-tap at this time.    Thana Farr, MD [facility] [phone]

## 2011-09-19 NOTE — Progress Notes (Signed)
09/18/11 2100 Patient was in a panicked/anxious state when she exclaimed to the nurse that her cellular phone was no longer in the patient's room. Patient requested supervisor to come to the room.Supervisor came to the room. RN searched for cellular phone and found phone on a [address] on [address]. Patient calmed down when cellular phone was found. B/P was elevated off and on throughout the night. Patient c/o continued blurred vision in right eye.

## 2011-09-20 DIAGNOSIS — H53139 Sudden visual loss, unspecified eye: Secondary | ICD-10-CM

## 2011-09-20 DIAGNOSIS — G932 Benign intracranial hypertension: Secondary | ICD-10-CM

## 2011-09-20 DIAGNOSIS — H4710 Unspecified papilledema: Secondary | ICD-10-CM

## 2011-09-20 DIAGNOSIS — H547 Unspecified visual loss: Secondary | ICD-10-CM

## 2011-09-20 DIAGNOSIS — I10 Essential (primary) hypertension: Secondary | ICD-10-CM

## 2011-09-20 DIAGNOSIS — J45901 Unspecified asthma with (acute) exacerbation: Secondary | ICD-10-CM

## 2011-09-20 LAB — BASIC METABOLIC PANEL
BUN: 26 mg/dL — ABNORMAL HIGH (ref 6–23)
Chloride: 93 mEq/L — ABNORMAL LOW (ref 96–112)
Creatinine, Ser: 2.64 mg/dL — ABNORMAL HIGH (ref 0.50–1.10)
Glucose, Bld: 115 mg/dL — ABNORMAL HIGH (ref 70–99)
Potassium: 3.1 mEq/L — ABNORMAL LOW (ref 3.5–5.1)

## 2011-09-20 LAB — FUNGAL STAIN: Special Requests: NORMAL

## 2011-09-20 MED ORDER — ONDANSETRON HCL 4 MG/2ML IJ SOLN
4.0000 mg | Freq: Four times a day (QID) | INTRAMUSCULAR | Status: DC | PRN
  Administered 2011-09-20: 4 mg via INTRAVENOUS
  Filled 2011-09-20: qty 2

## 2011-09-20 MED ORDER — POTASSIUM CHLORIDE 20 MEQ/15ML (10%) PO LIQD
40.0000 meq | Freq: Once | ORAL | Status: DC
  Filled 2011-09-20: qty 30

## 2011-09-20 MED ORDER — POTASSIUM CHLORIDE CRYS ER 20 MEQ PO TBCR
40.0000 meq | EXTENDED_RELEASE_TABLET | Freq: Once | ORAL | Status: AC
  Administered 2011-09-20: 40 meq via ORAL
  Filled 2011-09-20 (×2): qty 2

## 2011-09-20 MED ORDER — ONDANSETRON HCL 4 MG/2ML IJ SOLN
4.0000 mg | Freq: Four times a day (QID) | INTRAMUSCULAR | Status: AC | PRN
  Administered 2011-09-20 (×2): 4 mg via INTRAVENOUS
  Filled 2011-09-20 (×2): qty 2

## 2011-09-20 MED ORDER — FLUTICASONE-SALMETEROL 250-50 MCG/DOSE IN AEPB: 1.0000 | INHALATION_SPRAY | Freq: Two times a day (BID) | RESPIRATORY_TRACT | Status: DC

## 2011-09-20 MED ORDER — MONTELUKAST SODIUM 10 MG PO TABS
10.0000 mg | ORAL_TABLET | Freq: Every day | ORAL | Status: DC
  Administered 2011-09-20: 10 mg via ORAL
  Filled 2011-09-20 (×2): qty 1

## 2011-09-20 MED ORDER — SODIUM CHLORIDE 0.45 % IV SOLN
INTRAVENOUS | Status: DC
  Administered 2011-09-20: 10:00:00 via INTRAVENOUS

## 2011-09-20 MED ORDER — SODIUM CHLORIDE 0.9 % IV BOLUS (SEPSIS)
500.0000 mL | Freq: Once | INTRAVENOUS | Status: AC
  Administered 2011-09-20: 08:00:00 via INTRAVENOUS

## 2011-09-20 NOTE — Progress Notes (Signed)
Patient c/o nausea. Called dr for antiemetic; gave [employer] to patient to ease discomfort until med ordered.

## 2011-09-20 NOTE — Progress Notes (Signed)
Subjective: Patient reports that she has not worsened.  Dr. Venetia Constable has discussed the case with ophthalmology today and they have declined to see the patient while in the hospital but have arranged for the patient to be seen at discharge.  Diuresis has improved but unfortunately renal function has declined.    Objective: Current vital signs: BP 112/73  Pulse 79  Temp 98.5 F (36.9 C) (Oral)  Resp 16  Ht 5\' 5"  (1.651 m)  Wt 90.719 kg (200 lb)  BMI 33.28 kg/m2  SpO2 96%  Breastfeeding? No Vital signs in last 24 hours: Temp:  [98 F (36.7 C)-98.5 F (36.9 C)] 98.5 F (36.9 C) (06/21 1744) Pulse Rate:  [79-85] 79  (06/21 1744) Resp:  [16] 16  (06/21 1744) BP: (109-146)/(64-90) 112/73 mmHg (06/21 1744) SpO2:  [96 %-99 %] 96 % (06/21 1922)  Intake/Output from previous day: 06/20 0701 - 06/21 0700 In: 600 [P.O.:600] Out: 150 [Urine:150] Intake/Output this shift:   Nutritional status: Cardiac  Neurologic Exam: Mental Status:  Alert, oriented, thought content appropriate. Speech fluent without evidence of aphasia. Able to follow 3 step commands without difficulty.  Cranial Nerves:  II-Visual fields--when both eyes are open or just using left eye patient has no difficulty counting fingers in all visual fields. When only looking with right eye, she has difficulty counting my fingers in all visual fields. She also tends to hold the right eyelid closed but can hold eyelid fully open when looking at my finger with both eyes.  III/IV/VI-Extraocular movements intact. Pupils reactive bilaterally. No ptosis noted  V/VII-Smile symmetric  VIII-grossly intact bilateral to both pp and light touch  IX/X-normal gag  XI-bilateral shoulder shrug  XII-midline tongue extension  Motor: 5/5 bilaterally with normal tone and bulk  Sensory: Pinprick and light touch intact throughout, bilaterally  Deep Tendon Reflexes: 2+ and symmetric throughout Plantars downgoing bilaterally  Cerebellar: Normal  finger-to-nose, normal rapid alternating movements and normal heel-to-shin test.   Lab Results: Results for orders placed during the hospital encounter of 09/17/11 (from the past 48 hour(s))  SEDIMENTATION RATE     Status: Normal   Collection Time   09/19/11  4:20 AM      Component Value Range Comment   Sed Rate 6  0 - 22 mm/hr   BASIC METABOLIC PANEL     Status: Abnormal   Collection Time   09/20/11  5:04 AM      Component Value Range Comment   Sodium 136  135 - 145 mEq/L    Potassium 3.1 (*) 3.5 - 5.1 mEq/L    Chloride 93 (*) 96 - 112 mEq/L    CO2 25  19 - 32 mEq/L    Glucose, Bld 115 (*) 70 - 99 mg/dL    BUN 26 (*) 6 - 23 mg/dL    Creatinine, Ser 4.54 (*) 0.50 - 1.10 mg/dL    Calcium 9.8  8.4 - 09.8 mg/dL    GFR calc non Af Amer 22 (*) >90 mL/min    GFR calc Af Amer 25 (*) >90 mL/min   MAGNESIUM     Status: Normal   Collection Time   09/20/11  5:04 AM      Component Value Range Comment   Magnesium 2.3  1.5 - 2.5 mg/dL     Recent Results (from the past 240 hour(s))  GRAM STAIN     Status: Normal   Collection Time   09/18/11  1:23 PM      Component Value  Range Status Comment   Specimen Description CSF   Final    Special Requests Normal   Final    Gram Stain     Final    Value: NO WBC SEEN     NO ORGANISMS SEEN     Gram Stain Report Called to,Read Back By and Verified With: [id number]   Report Status 09/18/2011 FINAL   Final   FUNGAL STAIN     Status: Normal   Collection Time   09/18/11  1:23 PM      Component Value Range Status Comment   Specimen Description CSF   Final    Special Requests Normal   Final    Fungal Smear NO YEAST OR FUNGAL ELEMENTS SEEN   Final    Report Status 09/20/2011 FINAL   Final   CSF CULTURE     Status: Normal (Preliminary result)   Collection Time   09/18/11  1:24 PM      Component Value Range Status Comment   Specimen Description CSF   Final    Special Requests Normal   Final    Gram Stain     Final    Value: CYTOSPIN NO  WBC SEEN     NO SQUAMOUS EPITHELIAL CELLS SEEN     Gram Stain Report Called to,Read Back By and Verified With: Gram Stain Report Called to,Read Back By and Verified With: [facility] 1510 09/18/11 MURPHYD Performed by [facility]   Culture NO GROWTH 1 DAY   Final    Report Status PENDING   Incomplete   AFB CULTURE WITH SMEAR     Status: Normal (Preliminary result)   Collection Time   09/18/11  1:24 PM      Component Value Range Status Comment   Specimen Description CSF   Final    Special Requests Normal   Final    ACID FAST SMEAR NO ACID FAST BACILLI SEEN   Final    Culture     Final    Value: CULTURE WILL BE EXAMINED FOR 6 WEEKS BEFORE ISSUING A FINAL REPORT   Report Status PENDING   Incomplete     Lipid Panel  Basename 09/18/11 0530  CHOL 153  TRIG 133  HDL 43  CHOLHDL 3.6  VLDL 27  LDLCALC 83    Studies/Results: No results found.  Medications:  I have reviewed the patient's current medications. Scheduled:   . acetaZOLAMIDE  500 mg Oral Q12H  . amLODipine  10 mg Oral Daily  . budesonide  0.5 mg Nebulization BID  . labetalol  400 mg Oral BID  . levalbuterol  0.63 mg Nebulization Q6H  . montelukast  10 mg Oral QHS  . potassium chloride  40 mEq Oral Once  . potassium chloride  40 mEq Oral Once  . sodium chloride  500 mL Intravenous Once  . DISCONTD: Fluticasone-Salmeterol  1 puff Inhalation Q12H  . DISCONTD: furosemide  40 mg Oral BID  . DISCONTD: lisinopril  2.5 mg Oral Daily  . DISCONTD: montelukast  10 mg Oral QHS  . DISCONTD: potassium chloride  40 mEq Oral Once  . DISCONTD: potassium chloride  20 mEq Oral BID    Assessment/Plan:  Patient Active Hospital Problem List: BIH   Assessment:  Patient remains on Diamox.  Lasix being reduced for renal function.  CSF cultures are normal.  Cytology remains pending.     Plan:  Continue Diamox.  Once renal function improved may discharge with follow up by ophthalmology .   LOS: 3 days   Thana Farr, MD [facility] [phone] 09/20/2011  8:00 PM

## 2011-09-20 NOTE — Progress Notes (Signed)
Erica [name]   DOB:[birth year]   ZO#:[id number]   JWJ#:[id number]  Subjective: Erica [name] is in bed receiving a breathing treatments. She tolerated her lumbar puncture without complications. She tells me her vision in the right eye is a little better, perhaps, but certainly not clear. Currently she has no headache. Her main concern is that she is not receiving her steroid inhaler and she wonders what inhaler she will be going home with. There is a friend of asleep on the side couch in the room.   Objective: Young African American woman examined in bed  Filed Vitals:   09/20/11 0540  BP: 118/72  Pulse: 81  Temp: 98 F (36.7 C)  Resp: 16    Body mass index is 33.28 kg/(m^2).  Intake/Output Summary (Last 24 hours) at 09/20/11 0902 Last data filed at 09/19/11 1825  Gross per 24 hour  Intake    360 ml  Output    150 ml  Net    210 ml     Sclerae unicteric  Oropharynx clear  No peripheral adenopathy  Lungs clear -- no rales or rhonchi  Heart regular rate and rhythm  Abdomen benign  MSK no peripheral edema  Neuro nonfocal  Breast exam: Deferred  CBG (last 3)   Basename 09/18/11 0751  GLUCAP 120*     Labs:  Lab Results  Component Value Date   WBC 6.0 09/18/2011   HGB 14.3 09/18/2011   HCT 41.2 09/18/2011   MCV 89.8 09/18/2011   PLT 214 09/18/2011   NEUTROABS 2.7 09/17/2011    Urine Studies No results found for this basename: UACOL:2,UAPR:2,USPG:2,UPH:2,UTP:2,UGL:2,UKET:2,UBIL:2,UHGB:2,UNIT:2,UROB:2,ULEU:2,UEPI:2,UWBC:2,URBC:2,UBAC:2,CAST:2,CRYS:2,UCOM:2,BILUA:2 in the last 72 hours  Basic Metabolic Panel:  Lab 09/20/11 6213 09/18/11 0930 09/18/11 0530 09/17/11 2050  NA 136 -- 134* 137  K 3.1* -- 3.5 --  CL 93* -- 100 100  CO2 25 -- 23 28  GLUCOSE 115* -- 106* 128*  BUN 26* -- 11 9  CREATININE 2.64* -- 0.87 0.79  CALCIUM 9.8 -- 9.1 9.4  MG 2.3 2.0 -- --  PHOS -- -- -- --   GFR Estimated Creatinine Clearance: 32.2 ml/min (by C-G formula based on Cr of 2.64). Liver  Function Tests:  Lab 09/18/11 0530  AST 23  ALT 31  ALKPHOS 50  BILITOT 0.4  PROT 6.7  ALBUMIN 3.4*   No results found for this basename: LIPASE:5,AMYLASE:5 in the last 168 hours No results found for this basename: AMMONIA:5 in the last 168 hours Coagulation profile  Lab 09/18/11 0930  INR 0.95  PROTIME --    CBC:  Lab 09/18/11 0530 09/17/11 2050  WBC 6.0 5.1  NEUTROABS -- 2.7  HGB 14.3 14.6  HCT 41.2 41.3  MCV 89.8 89.0  PLT 214 204   Cardiac Enzymes: No results found for this basename: CKTOTAL:5,CKMB:5,CKMBINDEX:5,TROPONINI:5 in the last 168 hours BNP: No components found with this basename: POCBNP:5 CBG:  Lab 09/18/11 0751  GLUCAP 120*   D-Dimer No results found for this basename: DDIMER:2 in the last 72 hours Hgb A1c  Basename 09/18/11 0530  HGBA1C 5.4   Lipid Profile  Basename 09/18/11 0530  CHOL 153  HDL 43  LDLCALC 83  TRIG 133  CHOLHDL 3.6  LDLDIRECT --   Thyroid function studies  Basename 09/18/11 0930  TSH 2.648  T4TOTAL --  T3FREE --  THYROIDAB --   Anemia work up  [employer] 09/18/11 0930  VITAMINB12 725  FOLATE --  FERRITIN --  TIBC --  IRON --  RETICCTPCT --  Microbiology Recent Results (from the past 240 hour(s))  GRAM STAIN     Status: Normal   Collection Time   09/18/11  1:23 PM      Component Value Range Status Comment   Specimen Description CSF   Final    Special Requests Normal   Final    Gram Stain     Final    Value: NO WBC SEEN     NO ORGANISMS SEEN     Gram Stain Report Called to,Read Back By and Verified With: [id number]   Report Status 09/18/2011 FINAL   Final   CSF CULTURE     Status: Normal (Preliminary result)   Collection Time   09/18/11  1:24 PM      Component Value Range Status Comment   Specimen Description CSF   Final    Special Requests Normal   Final    Gram Stain     Final    Value: CYTOSPIN NO WBC SEEN     NO SQUAMOUS EPITHELIAL CELLS SEEN     Gram Stain Report Called  to,Read Back By and Verified With: Gram Stain Report Called to,Read Back By and Verified With: [facility] 1510 09/18/11 MURPHYD Performed by [facility]   Culture NO GROWTH   Final    Report Status PENDING   Incomplete   AFB CULTURE WITH SMEAR     Status: Normal (Preliminary result)   Collection Time   09/18/11  1:24 PM      Component Value Range Status Comment   Specimen Description CSF   Final    Special Requests Normal   Final    ACID FAST SMEAR NO ACID FAST BACILLI SEEN   Final    Culture     Final    Value: CULTURE WILL BE EXAMINED FOR 6 WEEKS BEFORE ISSUING A FINAL REPORT   Report Status PENDING   Incomplete       Studies:  MRI HEAD WITHOUT CONTRAST  Technique: Multiplanar, multiecho pulse sequences of the brain and  surrounding structures were obtained according to standard protocol  without intravenous contrast.  Comparison: Brain MRI without and with contrast 08/24/2010.  Findings: Stable and normal cerebral volume. No restricted  diffusion to suggest acute infarction. No midline shift, mass  effect, evidence of mass lesion, ventriculomegaly, extra-axial  collection or acute intracranial hemorrhage. Cervicomedullary  junction and pituitary are within normal limits. Major  intracranial vascular flow voids are stable.  Stable gray and white matter signal throughout the brain; normal  except for occasional nonspecific small foci of T2 and FLAIR  hyperintensity in the cerebral white matter, which were better  demonstrated on the prior exam due to a degree of motion artifact  today despite repeated imaging attempts.  Stable bone marrow signal. Stable and negative visualized cervical  spine. Visualized paranasal sinuses and mastoids are clear.  Negative scalp soft tissues.  Visualized orbit soft tissues are within normal limits.  IMPRESSION:  No acute intracranial abnormality. Stable noncontrast MRI  appearance of the brain.  Original Report Authenticated By: Erica [name], M.D.  Diagnosis CEREBROSPINAL FLUID: NO MALIGNANT CELLS IDENTIFIED. RARE SMALL LYMPHOCYTES Erica Miyamoto MD Pathologist, Electronic Signature (Case signed 09/19/2011)   Assessment: 36 y.o. [place] woman with history of locally advanced left breast carcinoma initially presenting in March 2010  (1) treated neoadjuvantly with 4 cycles of docetaxel/cyclophosphamide.  (2) status post left lumpectomy and sentinel lymph node dissection in July 2010 for a T2 N1, stage IIB  invasive ductal carcinoma, grade 3, triple negative with MIB-1 of 88%.  (3) status post full axillary dissection and margin clearance in August 2010.  (4) status post 11 doses of weekly paclitaxel completed in December 2010  (5) followed by radiation therapy completed in March 2011.  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 This is very good news for her. On the other hand it is not clear if she is going to get her vision back in the right eye. She  understands the importance of continuing her blood pressure medications without interruptions. I agree with the hospitalists regarding Tracy's increased intracranial pressure,  avoiding steroids if possible, and using Diamox in preference of other diuretics. Currently her creatinine has increased and that is being taking care of today. I suspect she will be ready for discharge tomorrow. She has an appointment with [place] already on June 26 at 2:15 PM. She is aware of that.  Please let me know if I can be of further assistance.   Erlinda Solinger C 09/20/2011

## 2011-09-20 NOTE — Progress Notes (Signed)
Appreciate neurology/oncology follow up. Discussed with Dr Charlotte Sanes yesterday. She felt that she would not make any change in management in house and was happy with the neurology plan of care. She does not expect the vision to dramatically improve, and her hope is that left eye vision can be preserved. She will do eye exam in the office post discharge. I also discussed the plan of care with dr Darnelle Catalan today. Ms Kirchoff has a bump in creatinine which is related to diuresis and maybe acei. I suspect this should improve with backing off diuretics(keep on diamox) and acei, with a little bit of fluids.  SUBJECTIVE Ms Mcgirr reports no big change in her vision today.   1. Hypertensive emergency   2. Vision loss   3. Hypokalemia   4. Benign essential hypertension antepartum   5. Late prenatal care   6. Unspecified hypertension antepartum   7. Suspected damage to fetus from other disease in mother, affecting management of mother, antepartum condition or complication   8. Breast cancer     Past Medical History  Diagnosis Date  . Breast cancer 2010  . Asthma   . Hypertension   . GERD (gastroesophageal reflux disease)   . Impaired glucose tolerance 04/13/2011  . Anemia, unspecified 04/13/2011  . Asthma 09/27/2010  . Bloating 03/18/2011  . 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 Lymphadenitis, chronic     restricted LEFT extremity   Current Facility-Administered Medications  Medication Dose Route Frequency Provider Last Rate Last Dose  . 0.45 % sodium chloride infusion   Intravenous Continuous Bence Trapp, MD 50 mL/hr at 09/20/11 1021    . acetaminophen (TYLENOL) tablet 650 mg  650 mg Oral Q4H PRN Eduard Clos, MD   650 mg at 09/18/11 1534  . acetaZOLAMIDE (DIAMOX) 12 hr capsule 500 mg  500 mg Oral Q12H Thana Farr, MD   500 mg at 09/20/11 1018  . amLODipine (NORVASC) tablet 10 mg  10 mg Oral Daily Eduard Clos, MD   10 mg at 09/20/11 1018  . budesonide (PULMICORT) nebulizer solution  0.5 mg  0.5 mg Nebulization BID Eduard Clos, MD   0.5 mg at 09/19/11 1935  . furosemide (LASIX) injection 60 mg  60 mg Intravenous Once Sharion Grieves, MD   60 mg at 09/19/11 1122  . hydrALAZINE (APRESOLINE) injection 10 mg  10 mg Intravenous Q4H PRN Eduard Clos, MD   10 mg at 09/18/11 1229  . labetalol (NORMODYNE) tablet 400 mg  400 mg Oral BID Eduard Clos, MD   400 mg at 09/20/11 1018  . levalbuterol (XOPENEX) nebulizer solution 0.63 mg  0.63 mg Nebulization Q6H Eduard Clos, MD   0.63 mg at 09/20/11 0844  . levalbuterol (XOPENEX) nebulizer solution 0.63 mg  0.63 mg Nebulization Q6H PRN Eduard Clos, MD   0.63 mg at 09/18/11 2347  . montelukast (SINGULAIR) tablet 10 mg  10 mg Oral QHS Kanylah Muench, MD      . ondansetron (ZOFRAN) injection 4 mg  4 mg Intravenous Q6H PRN Caroline More, NP   4 mg at 09/19/11 1122  . ondansetron (ZOFRAN) injection 4 mg  4 mg Intravenous Q6H PRN Caroline More, NP   4 mg at 09/20/11 0547  . oxyCODONE-acetaminophen (PERCOCET) 5-325 MG per tablet 1-2 tablet  1-2 tablet Oral Q6H PRN Eduard Clos, MD   2 tablet at 09/20/11 0107  . potassium chloride 20 MEQ/15ML (10%) liquid 40 mEq  40 mEq  Oral Once Rachella Basden, MD      . potassium chloride 20 MEQ/15ML (10%) liquid 40 mEq  40 mEq Oral Once Bassy Fetterly, MD   40 mEq at 09/20/11 0830  . sodium chloride 0.9 % bolus 500 mL  500 mL Intravenous Once Suzy Kugel, MD      . DISCONTD: Fluticasone-Salmeterol (ADVAIR) 250-50 MCG/DOSE inhaler 1 puff  1 puff Inhalation Q12H Fontella Shan, MD      . DISCONTD: furosemide (LASIX) tablet 40 mg  40 mg Oral BID Sunnie Odden, MD   40 mg at 09/19/11 1812  . DISCONTD: lisinopril (PRINIVIL,ZESTRIL) tablet 2.5 mg  2.5 mg Oral Daily Elonda Giuliano, MD   2.5 mg at 09/19/11 1123  . DISCONTD: montelukast (SINGULAIR) tablet 10 mg  10 mg Oral QHS Eduard Clos, MD   10 mg at 09/19/11 2107  . DISCONTD: potassium chloride SA (K-DUR,KLOR-CON) CR tablet  20 mEq  20 mEq Oral BID Darilyn Storbeck, MD   20 mEq at 09/19/11 2108   Allergies  Allergen Reactions  . Aspirin Shortness Of Breath and Palpitations    Pt can take ibuprofen without reaction  . Penicillins Shortness Of Breath and Palpitations   Principal Problem:  *Vision loss of right eye Active Problems:  Breast cancer  Papilloedema, unspecified  Asthma exacerbation   Vital signs in last 24 hours: Temp:  [98 F (36.7 C)-98.4 F (36.9 C)] 98.1 F (36.7 C) (06/21 1021) Pulse Rate:  [80-93] 80  (06/21 1021) Resp:  [16] 16  (06/21 1021) BP: (118-146)/(71-90) 138/79 mmHg (06/21 1021) SpO2:  [95 %-99 %] 98 % (06/21 1021) Weight change:     Intake/Output from previous day: 06/20 0701 - 06/21 0700 In: 600 [P.O.:600] Out: 150 [Urine:150] Intake/Output this shift: Total I/O In: 120 [P.O.:120] Out: -   Lab Results:  Basename 09/18/11 0530 09/17/11 2050  WBC 6.0 5.1  HGB 14.3 14.6  HCT 41.2 41.3  PLT 214 204   BMET  Basename 09/20/11 0504 09/18/11 0530  NA 136 134*  K 3.1* 3.5  CL 93* 100  CO2 25 23  GLUCOSE 115* 106*  BUN 26* 11  CREATININE 2.64* 0.87  CALCIUM 9.8 9.1    Studies/Results: No results found.  Medications: I have reviewed the patient's current medications.   Physical exam GENERAL- alert, unchanged from yesterday. HEAD- normal atraumatic, no neck masses, normal thyroid, no jvd RESPIRATORY- appears well, vitals normal, no respiratory distress, acyanotic, normal RR, ear and throat exam is normal, neck free of mass or lymphadenopathy, chest clear, no wheezing, crepitations, rhonchi, normal symmetric air entry CVS- regular rate and rhythm, S1, S2 normal, no murmur, click, rub or gallop ABDOMEN- abdomen is soft without significant tenderness, masses, organomegaly or guarding NEURO- Grossly normal EXTREMITIES- extremities normal, atraumatic, no cyanosis or edema  Plan   * Vision loss of right eye/Papilloedema, unspecified -seems better. Raised  opening lp pressure. Discussed with Dr Thad Ranger. Left a message with Dr Charlotte Sanes. Agree with diuresis. Will add lasix.  * Htn-better controlled. * BA- patient prefers her home regimen of bronchodilators, etc. Plan to d/c her on same. * AKI- hold lasix. Gentle fluids. Expect quick resolution. Repeat bmp in am, if trending down, can d/c home.  * Breast cancer hx.  dvt prophylaxis. No need for telemetry.    Cecily Lawhorne 09/20/2011 10:54 AM Pager: [phone].

## 2011-09-21 DIAGNOSIS — I10 Essential (primary) hypertension: Secondary | ICD-10-CM

## 2011-09-21 DIAGNOSIS — H4710 Unspecified papilledema: Secondary | ICD-10-CM

## 2011-09-21 DIAGNOSIS — N179 Acute kidney failure, unspecified: Secondary | ICD-10-CM | POA: Diagnosis not present

## 2011-09-21 DIAGNOSIS — J45901 Unspecified asthma with (acute) exacerbation: Secondary | ICD-10-CM

## 2011-09-21 DIAGNOSIS — H547 Unspecified visual loss: Secondary | ICD-10-CM

## 2011-09-21 LAB — BASIC METABOLIC PANEL
BUN: 25 mg/dL — ABNORMAL HIGH (ref 6–23)
CO2: 25 mEq/L (ref 19–32)
Chloride: 100 mEq/L (ref 96–112)
Glucose, Bld: 107 mg/dL — ABNORMAL HIGH (ref 70–99)
Potassium: 3.4 mEq/L — ABNORMAL LOW (ref 3.5–5.1)

## 2011-09-21 MED ORDER — AMLODIPINE BESYLATE 10 MG PO TABS: 10.0000 mg | ORAL_TABLET | Freq: Every day | ORAL | Status: DC

## 2011-09-21 MED ORDER — POTASSIUM CHLORIDE ER 10 MEQ PO TBCR: 20.0000 meq | EXTENDED_RELEASE_TABLET | ORAL | Status: DC

## 2011-09-21 MED ORDER — LABETALOL HCL 200 MG PO TABS: 400.0000 mg | ORAL_TABLET | Freq: Two times a day (BID) | ORAL | Status: DC

## 2011-09-21 MED ORDER — ACETAZOLAMIDE ER 500 MG PO CP12: 500.0000 mg | ORAL_CAPSULE | Freq: Two times a day (BID) | ORAL | Status: DC

## 2011-09-21 NOTE — Progress Notes (Signed)
Patient states right eye vision is still blurry.  Unable to recognize how many fingers holding up (right eye).

## 2011-09-21 NOTE — Discharge Summary (Signed)
DISCHARGE SUMMARY  Erica [name]  MR#: [id number]  DOB:July 23, 1975  Date of Admission: 09/17/2011 Date of Discharge: 09/21/2011  Attending Physician:Lacreshia Bondarenko  Patient's BJY:NWGNF Erica Ruiz, MD  Consults:Treatment Team:  Kym Groom, MD  Discharge Diagnoses: Present on Admission:  .Vision loss of right eye .Papilloedema, unspecified .Asthma exacerbation .Breast cancer  Hospital Course: Erica [name] was admitted on 09/18/11 with right eye vision loss found to be due to papilledema ?pseudotumor cerebri. She has blurred vision which improved some after a lumbar puncture by Dr  Thad Ranger, who took out about 30 mls of CSF. An MRI of the brain was unrevealing. Dr Reynolds(neurology), and Dr Mccuen(ophthalmology- who also performed eye exam before admission) felt that patient needed the papilledema lowered, with trial of diuretics first. Erica [name] was placed on lasix/diamox, but she got over diuresed, with a bump in creatinine from 0.87 on 09/18/11 to 2.64 on 09/20/11. Lasix was hence discontinued and fluids initiated. Her creatinine is now trending and expected to get to normal(1.62 today). I have encouraged her to take a little bit more fluid at home, and have it rechecked by her PCP in the next week. Her blood pressure was poorly controlled at admission but it is now controlled  Since she started taking meds in house. She had "apparently ran out of the meds". Her left eye vision is normal but right eye still blurry. She should follow with Dr Charlotte Sanes as scheduled for a follow up eye ezxam, with Dr Darnelle Catalan as scheduled for the hx of breast cancer, and to follow her CSF cytology. She should have her bmp checked by her pcp in the next week. She is discharged in stable condition.   Medication List  As of 09/21/2011 11:20 AM   STOP taking these medications         hydrochlorothiazide 25 MG tablet         TAKE these medications         acetaZOLAMIDE 500 MG capsule   Commonly known as: DIAMOX   Take  1 capsule (500 mg total) by mouth every 12 (twelve) hours.      albuterol (2.5 MG/3ML) 0.083% nebulizer solution   Commonly known as: PROVENTIL   Take 3 mLs (2.5 mg total) by nebulization every 4 (four) hours as needed.      amLODipine 10 MG tablet   Commonly known as: NORVASC   Take 1 tablet (10 mg total) by mouth daily.      Fluticasone-Salmeterol 250-50 MCG/DOSE Aepb   Commonly known as: ADVAIR   Inhale 1 puff into the lungs every 12 (twelve) hours.      ipratropium 0.02 % nebulizer solution   Commonly known as: ATROVENT   Take 2.5 mLs (500 mcg total) by nebulization 2 (two) times daily as needed. For shortness of breath      labetalol 200 MG tablet   Commonly known as: NORMODYNE   Take 2 tablets (400 mg total) by mouth 2 (two) times daily.      Mometasone Furo-Formoterol Fum 200-5 MCG/ACT Aero   Inhale 2 puffs into the lungs 2 (two) times daily.      montelukast 10 MG tablet   Commonly known as: SINGULAIR   Take 1 tablet (10 mg total) by mouth at bedtime.      oxyCODONE-acetaminophen 5-325 MG per tablet   Commonly known as: PERCOCET   Take 1-2 tablets by mouth every 6 (six) hours as needed for pain.      potassium chloride 10 MEQ tablet  Commonly known as: K-DUR   Take 2 tablets (20 mEq total) by mouth 1 day or 1 dose.             Day of Discharge BP 127/78  Pulse 85  Temp 98.2 F (36.8 C) (Oral)  Resp 18  Ht 5\' 5"  (1.651 m)  Wt 90.719 kg (200 lb)  BMI 33.28 kg/m2  SpO2 100%  Breastfeeding? No  Physical Exam: Unremarkable.  Results for orders placed during the hospital encounter of 09/17/11 (from the past 24 hour(s))  BASIC METABOLIC PANEL     Status: Abnormal   Collection Time   09/21/11  5:35 AM      Component Value Range   Sodium 134 (*) 135 - 145 mEq/L   Potassium 3.4 (*) 3.5 - 5.1 mEq/L   Chloride 100  96 - 112 mEq/L   CO2 25  19 - 32 mEq/L   Glucose, Bld 107 (*) 70 - 99 mg/dL   BUN 25 (*) 6 - 23 mg/dL   Creatinine, Ser 1.61 (*) 0.50 -  1.10 mg/dL   Calcium 9.5  8.4 - 09.6 mg/dL   GFR calc non Af Amer 39 (*) >90 mL/min   GFR calc Af Amer 46 (*) >90 mL/min    Disposition: home today.   Follow-up Appts: Discharge Orders    Future Appointments: Provider: Department: Dept Phone: Center:   09/25/2011 2:15 PM Amy Allegra Grana, PA [facility] [phone] None     Future Orders Please Complete By Expires   Diet - low sodium heart healthy      Increase activity slowly           Tests Needing Follow-up: Bmp/eye exam.  Time spent in discharge (includes decision making & examination of pt): 20 minutes  Signed: Arlen Dupuis 09/21/2011, 11:20 AM

## 2011-09-22 LAB — CSF CULTURE W GRAM STAIN: Gram Stain: NONE SEEN

## 2011-09-25 ENCOUNTER — Ambulatory Visit (HOSPITAL_BASED_OUTPATIENT_CLINIC_OR_DEPARTMENT_OTHER): Payer: Self-pay | Admitting: Physician Assistant

## 2011-09-25 ENCOUNTER — Encounter: Payer: Self-pay | Admitting: Physician Assistant

## 2011-09-25 VITALS — BP 170/100 | HR 109 | Temp 98.3°F | Ht 65.0 in | Wt 202.4 lb

## 2011-09-25 DIAGNOSIS — H479 Unspecified disorder of visual pathways: Secondary | ICD-10-CM

## 2011-09-25 DIAGNOSIS — H5461 Unqualified visual loss, right eye, normal vision left eye: Secondary | ICD-10-CM

## 2011-09-25 DIAGNOSIS — H4710 Unspecified papilledema: Secondary | ICD-10-CM

## 2011-09-25 DIAGNOSIS — C50919 Malignant neoplasm of unspecified site of unspecified female breast: Secondary | ICD-10-CM

## 2011-09-25 DIAGNOSIS — I10 Essential (primary) hypertension: Secondary | ICD-10-CM

## 2011-09-25 MED ORDER — HYDROCHLOROTHIAZIDE 25 MG PO TABS: 25.0000 mg | ORAL_TABLET | Freq: Every day | ORAL | Status: DC

## 2011-09-25 NOTE — Progress Notes (Signed)
ID: [name]   DOB: [birth year]  MR#: [id number]  CSN#:[id number]  HISTORY OF PRESENT ILLNESS: Erica [name] noticed a lump in her left breast sometime in August or September 2009.  Initially this was thought to possibly be "dry breast milk" but as the mass got bigger, she brought it to her physician's attention and was set up for mammography.  This was performed May 20, 2008 at [facility] and it showed a palpable 8.5 cm oval circumscribed mass in the left breast upper outer quadrant.  There were prominent left axillary lymph nodes noted as well.  An ultrasound showed this to be hypoechoic, with lobulated margins and probable central necrosis measuring up to 7.3 cm on this modality.  Ultrasound of the left axilla demonstrated numerous prominent lymph nodes at the upper limit of normal, the largest measuring 1 cm with a normal appearing thin cortex and a normal cortex to halo ratio.    Because the patient had no insurance, she was referred to [facility] and biopsy was performed in [place] with the pathology there ([id number]) showing a poorly differentiated malignancy which was triple negative (ER 1%, PR 0%, HER-2/neu 0).  The axillary lymph nodes were not biopsied. With this information, the patient was presented at the Breast Multidisciplinary Conference and the feeling was that, after appropriate staging, the patient would be a good candidate for neoadjuvant treatment and possibly NSABP B40.  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 She's currently on both labetalol and amlodipine, and while her blood pressure has improved minimally, it is slightly better at 170/100 today. (Was 191/112 in the clinic last week.) She was referred to Dr. Oliver Barre following hospitalization, but that appointment  has not yet been made.  Erica [name] notices only minimal improvement in the vision of her right eye. She tells me it is still very blurry like there is "something over her eye". She denies any pain. She has had some occasional headaches since her hospitalization, but these are intermittent and resolves spontaneously without medication.  Of course the good news is that neither the spinal fluid nor the brain MRI showed any evidence of cancer.   REVIEW OF SYSTEMS: Erica [name]  has had no fevers or chills. No rashes or abnormal bleeding. No nausea, emesis, or change in bowel habits. No dizziness or loss of consciousness. She continues to have shortness of breath associated with asthma, in addition to an occasional cough. This has not changed or worsened. No chest pain, pressure, or palpitations. No unusual myalgias or arthralgias.  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  A detailed review of systems is otherwise noncontributory.  PAST MEDICAL HISTORY: Past Medical History  Diagnosis Date  . Breast cancer 2010  . Asthma   . Hypertension   . GERD (gastroesophageal reflux disease)   . Impaired glucose tolerance 04/13/2011  . Anemia, unspecified 04/13/2011  . Asthma 09/27/2010  . Bloating 03/18/2011  . 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 Lymphadenitis, chronic     restricted LEFT extremity    PAST SURGICAL HISTORY: Past Surgical History  Procedure Date  . Hand surgery   . Breast lumpectomy 09/2008  . Carpal tunnel release     left hand  . Tubal ligation 05/11/2011  Procedure: POST PARTUM TUBAL LIGATION;  Surgeon: Scheryl Darter, MD;  Location: [facility];  Service: Gynecology;  Laterality: Bilateral;  Induced for HTN    FAMILY HISTORY Family History  Problem Relation Age of Onset  . Diabetes Maternal Grandmother   . Heart disease Maternal Grandmother   The patient's parents are alive. The patient has one brother and  one sister.  There is no ovarian or breast cancer in the immediate family, but the patient's maternal grandmother, who was one of 10 sisters, had one sister with breast cancer diagnosed in her sixties.    GYNECOLOGIC HISTORY: The patient is GX P4, first pregnancy to term at age 72.  She stopped having periods at the time of chemotherapy, and had had no periods for the last 2 years, then became pregnant. This was a surprise to her, and she was not aware of the pregnancy until the seventh month. The baby is now 74 months old. She has not had a period since the birth. She is not nursing.  SOCIAL HISTORY: Currently she is unemployed but she has been a Social research officer, government in the past.  She moved to [place] from [place] about 2007. She lives with Erica [name] who is present today.  He has a job Health and safety inspector. Erica [name] has three children,  all living with the patient's mother in [place], and now of course baby Erica [name], who is a home with her and Erica [name]. 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 Smoking status: Never Smoker   . Smokeless tobacco: Never Used  . Alcohol Use: No     Colonoscopy:  PAP:  Bone density:  Lipid panel:  Allergies  Allergen Reactions  . Aspirin Shortness Of Breath and Palpitations    Pt can take ibuprofen without reaction  . Penicillins Shortness Of Breath and Palpitations    Current Outpatient Prescriptions  Medication Sig Dispense Refill  . acetaZOLAMIDE (DIAMOX) 500 MG capsule Take 1 capsule (500 mg total) by mouth every 12 (twelve) hours.  60 capsule  0  . albuterol (PROVENTIL) (2.5 MG/3ML) 0.083% nebulizer solution Take 3 mLs (2.5 mg total) by nebulization every 4 (four) hours as needed.  75 mL  1  . amLODipine (NORVASC) 10 MG tablet Take 1 tablet (10 mg total) by mouth daily.  30 tablet  1  . Fluticasone-Salmeterol (ADVAIR) 250-50 MCG/DOSE AEPB Inhale 1 puff into the lungs every 12  (twelve) hours.  60 each  1  . ipratropium (ATROVENT) 0.02 % nebulizer solution Take 2.5 mLs (500 mcg total) by nebulization 2 (two) times daily as needed. For shortness of breath  75 mL  1  . labetalol (NORMODYNE) 200 MG tablet Take 2 tablets (400 mg total) by mouth 2 (two) times daily.  120 tablet  1  . Mometasone Furo-Formoterol Fum (DULERA) 200-5 MCG/ACT AERO Inhale 2 puffs into the lungs 2 (two) times daily.  1 Inhaler  6  . montelukast (SINGULAIR) 10 MG tablet Take 1 tablet (10 mg total) by mouth at bedtime.  30 tablet  1  . potassium chloride (K-DUR) 10 MEQ tablet Take 2 tablets (20 mEq total) by mouth 1 day or 1 dose.  30 tablet  0  . hydrochlorothiazide (HYDRODIURIL) 25 MG tablet Take 1 tablet (25 mg total) by mouth daily.  30 tablet  1    OBJECTIVE: Erica [name] who appears slightly anxious but in no acute distress. Filed Vitals:  09/25/11 1508  BP: 170/100  Pulse: 109  Temp: 98.3 F (36.8 C)     Body mass index is 33.68 kg/(m^2).    ECOG FS: 1  Filed Weights   09/25/11 1508  Weight: 202 lb 6.4 oz (91.808 kg)   Physical Exam: HEENT:  Sclerae anicteric, conjunctivae pink.  PERRLA. Vision is significantly decreased in the right eye, but appears grossly normal on the left.  Nodes:  No cervical, supraclavicular, or axillary lymphadenopathy palpated.  Breast Exam:  Deferred  Lungs:  Slightly diminished breath sounds by basilar. No crackles, rhonchi, or wheezes.   Heart:  Regular rate and rhythm.   Abdomen:  Soft, obese, nontender.  Positive bowel sounds.  No organomegaly or masses palpated.   Musculoskeletal:  No focal spinal tenderness to palpation.  Extremities: Nonpitting pedal edema bilaterally, equal bilaterally. No upper extremity edema. No peripheral cyanosis.   Skin:  Benign.   Neuro:  Nonfocal. Alert and oriented x3.    LAB RESULTS: Lab Results  Component Value Date   WBC 6.0 09/18/2011   NEUTROABS 2.7 09/17/2011   HGB 14.3 09/18/2011   HCT 41.2  09/18/2011   MCV 89.8 09/18/2011   PLT 214 09/18/2011      Chemistry      Component Value Date/Time   NA 134* 09/21/2011 0535   K 3.4* 09/21/2011 0535   CL 100 09/21/2011 0535   CO2 25 09/21/2011 0535   BUN 25* 09/21/2011 0535   CREATININE 1.62* 09/21/2011 0535   CREATININE 0.64 05/02/2011 1021      Component Value Date/Time   CALCIUM 9.5 09/21/2011 0535   ALKPHOS 50 09/18/2011 0530   AST 23 09/18/2011 0530   ALT 31 09/18/2011 0530   BILITOT 0.4 09/18/2011 0530       Lab Results  Component Value Date   LABCA2 11 05/30/2011    STUDIES: Mr Brain Wo Contrast  09/17/2011  *RADIOLOGY REPORT*  Clinical Data: 36 year old [name] with 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  Hypertension.  History of breast cancer.  MRI HEAD WITHOUT CONTRAST  Technique:  Multiplanar, multiecho pulse sequences of the brain and surrounding structures were obtained according to standard protocol without intravenous contrast.  Comparison: Brain MRI without and with contrast 08/24/2010.  Findings: Stable and normal cerebral volume. No restricted diffusion to suggest acute infarction.  No midline shift, mass effect, evidence of mass lesion, ventriculomegaly, extra-axial collection or acute intracranial hemorrhage.  Cervicomedullary junction and pituitary are within normal limits.  Major intracranial vascular flow voids are stable.  Stable gray and white matter signal throughout the brain; normal except for occasional nonspecific small foci of T2 and FLAIR hyperintensity in the cerebral white matter, which were better demonstrated on the prior exam due to a degree of motion artifact today despite repeated imaging attempts.  Stable bone marrow signal.  Stable and negative visualized cervical spine.  Visualized paranasal sinuses and mastoids are clear. Negative scalp soft tissues.  Visualized orbit soft tissues are within normal limits.  IMPRESSION: No acute intracranial abnormality.  Stable noncontrast MRI appearance of the brain.  Original  Report Authenticated By: Harley Hallmark, M.D.    ASSESSMENT: 36 y.o. [place] woman with history of locally advanced left breast carcinoma initially presenting in March 2010   (1) treated neoadjuvantly with 4 cycles of docetaxel/cyclophosphamide.    (2) status post left lumpectomy and sentinel lymph node dissection in July 2010 for a T2 N1, stage IIB invasive ductal carcinoma, grade 3, triple negative with MIB-1  of 88%.   (3) status post full axillary dissection and margin clearance in August 2010.   (4) status post 11 doses of weekly paclitaxel completed in December 2010   (5) followed by radiation therapy completed in March 2011.   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 Darnelle Catalan. With regards to her breast cancer, Chanley is actually doing extremely well, and we will resume our normal three-month followup schedule.  In the meanwhile, we are referring her to Dr. Oliver Barre.  He was recommended by her physician in the hospital, but appointment was never made. We'll request that this appointment be made as soon as possible for further evaluation of her hypertension, papilledema, and asthma. She is also scheduled to followup with Dr. Charlotte Sanes regarding the loss of vision in the right eye. Dr. Darnelle Catalan has also suggested starting the patient back on a low dose of hydrochlorothiazide, specifically 25 mg daily, and she was given a new prescription today. She will continue on labetalol and amlodipine as well.  Felesha voices understanding and agreement with our plan, she will call with any changes or problems.  Ninoshka Wainwright    09/25/2011

## 2011-09-30 ENCOUNTER — Telehealth: Payer: Self-pay | Admitting: *Deleted

## 2011-09-30 NOTE — Telephone Encounter (Signed)
Called patient again on 09-30-2011 asking has she come in to re-new her green card for the insurance process patient stated no she has not spoke with Carolynne Edouard fcounselor all the patient to do is come fill out an application and be re-stated patient has yet to do that md has requested for the patient to see dr.james jones at [facility] stat per speaking with [place] if the patient does not have insurance they will be asking for at least half of the office visit upfront. Patient is aware patient stated that she will come in this week to meet with [facility].

## 2011-10-01 ENCOUNTER — Telehealth: Payer: Self-pay | Admitting: Oncology

## 2011-10-01 NOTE — Telephone Encounter (Signed)
Did attempt to call patient in reference to financial assistance re-applying. Patient phone message was full unable to leave a message at this time.

## 2011-10-31 LAB — AFB CULTURE WITH SMEAR (NOT AT ARMC): Acid Fast Smear: NONE SEEN

## 2011-12-24 ENCOUNTER — Other Ambulatory Visit: Payer: Self-pay | Admitting: Lab

## 2011-12-31 ENCOUNTER — Ambulatory Visit (HOSPITAL_BASED_OUTPATIENT_CLINIC_OR_DEPARTMENT_OTHER): Payer: Self-pay | Admitting: Oncology

## 2011-12-31 ENCOUNTER — Telehealth: Payer: Self-pay | Admitting: Oncology

## 2011-12-31 VITALS — BP 167/133 | HR 106 | Temp 97.9°F | Resp 20 | Ht 65.0 in | Wt 207.6 lb

## 2011-12-31 DIAGNOSIS — O10019 Pre-existing essential hypertension complicating pregnancy, unspecified trimester: Secondary | ICD-10-CM

## 2011-12-31 DIAGNOSIS — C50919 Malignant neoplasm of unspecified site of unspecified female breast: Secondary | ICD-10-CM

## 2011-12-31 DIAGNOSIS — C50419 Malignant neoplasm of upper-outer quadrant of unspecified female breast: Secondary | ICD-10-CM

## 2011-12-31 DIAGNOSIS — O358XX0 Maternal care for other (suspected) fetal abnormality and damage, not applicable or unspecified: Secondary | ICD-10-CM

## 2011-12-31 DIAGNOSIS — O169 Unspecified maternal hypertension, unspecified trimester: Secondary | ICD-10-CM

## 2011-12-31 DIAGNOSIS — I10 Essential (primary) hypertension: Secondary | ICD-10-CM

## 2011-12-31 DIAGNOSIS — O0930 Supervision of pregnancy with insufficient antenatal care, unspecified trimester: Secondary | ICD-10-CM

## 2011-12-31 MED ORDER — AMLODIPINE BESYLATE 10 MG PO TABS: 10.0000 mg | ORAL_TABLET | Freq: Every day | ORAL | Status: DC

## 2011-12-31 MED ORDER — MOMETASONE FURO-FORMOTEROL FUM 200-5 MCG/ACT IN AERO: 2.0000 | INHALATION_SPRAY | Freq: Two times a day (BID) | RESPIRATORY_TRACT | Status: DC

## 2011-12-31 MED ORDER — MONTELUKAST SODIUM 10 MG PO TABS: 10.0000 mg | ORAL_TABLET | Freq: Every day | ORAL | Status: DC

## 2011-12-31 MED ORDER — LABETALOL HCL 200 MG PO TABS: 400.0000 mg | ORAL_TABLET | Freq: Two times a day (BID) | ORAL | Status: DC

## 2011-12-31 MED ORDER — IPRATROPIUM BROMIDE 0.02 % IN SOLN: 500.0000 ug | Freq: Two times a day (BID) | RESPIRATORY_TRACT | Status: DC | PRN

## 2011-12-31 MED ORDER — ALBUTEROL SULFATE (2.5 MG/3ML) 0.083% IN NEBU: 2.5000 mg | INHALATION_SOLUTION | RESPIRATORY_TRACT | Status: DC | PRN

## 2011-12-31 MED ORDER — POTASSIUM CHLORIDE ER 10 MEQ PO TBCR: 20.0000 meq | EXTENDED_RELEASE_TABLET | ORAL | Status: DC

## 2011-12-31 MED ORDER — HYDROCHLOROTHIAZIDE 25 MG PO TABS: 25.0000 mg | ORAL_TABLET | Freq: Every day | ORAL | Status: DC

## 2011-12-31 MED ORDER — FLUTICASONE-SALMETEROL 250-50 MCG/DOSE IN AEPB: 1.0000 | INHALATION_SPRAY | Freq: Two times a day (BID) | RESPIRATORY_TRACT | Status: DC

## 2011-12-31 NOTE — Progress Notes (Signed)
ID: Erica [name]   DOB: [birth year]  MR#: [id number]  CSN#:[id number]  HISTORY OF PRESENT ILLNESS: Erica [name] noticed a lump in her left breast sometime in August or September 2009.  Initially this was thought to possibly be "dry breast milk" but as the mass got bigger, she brought it to her physician's attention and was set up for mammography.  This was performed May 20, 2008 at [facility] and it showed a palpable 8.5 cm oval circumscribed mass in the left breast upper outer quadrant.  There were prominent left axillary lymph nodes noted as well.  An ultrasound showed this to be hypoechoic, with lobulated margins and probable central necrosis measuring up to 7.3 cm on this modality.  Ultrasound of the left axilla demonstrated numerous prominent lymph nodes at the upper limit of normal, the largest measuring 1 cm with a normal appearing thin cortex and a normal cortex to halo ratio.    Because the patient had no insurance, she was referred to [facility] and biopsy was performed in [place] with the pathology there ([id number]) showing a poorly differentiated malignancy which was triple negative (ER 1%, PR 0%, HER-2/neu 0).  The axillary lymph nodes were not biopsied. With this information, the patient was presented at the Breast Multidisciplinary Conference and the feeling was that, after appropriate staging, the patient would be a good candidate for neoadjuvant treatment and possibly NSABP B40.  Her subsequent history is as detailed below  INTERVAL HISTORY: Erica [name] returns today for followup of her locally advanced breast cancer. Since the last visit here she has relocated to [place], [place]. However she wishes to continue to receive her medical care here.  REVIEW OF SYSTEMS: She sleeps poorly, having trouble falling asleep and trouble staying asleep. She does not wake up at a fixed hour every morning, which is my general suggestion. Sometimes she has pain under the left breast, in the rib cage  area, and sometimes in the left lower quadrant. These pains can "bring it to my knees", and they can last up to 5 minutes at a time. However they're very inconstant, perhaps once a week or less. Her vision never recovered, but she sees "prettywell" out of her good eye. Sometimes her feet swell, she has a history of asthma with occasional exacerbations, heartburn, joint pain, and occasional headaches which localized to the left supraorbital area. Overall a detailed review of systems was otherwise noncontributory  PAST MEDICAL HISTORY: Past Medical History  Diagnosis Date  . Breast cancer 2010  . Asthma   . Hypertension   . GERD (gastroesophageal reflux disease)   . Impaired glucose tolerance 04/13/2011  . Anemia, unspecified 04/13/2011  . Asthma 09/27/2010  . Bloating 03/18/2011  . 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 Lymphadenitis, chronic     restricted LEFT extremity    PAST SURGICAL HISTORY: Past Surgical History  Procedure Date  . Hand surgery   . Breast lumpectomy 09/2008  . Carpal tunnel release     left hand  . Tubal ligation 05/11/2011    Procedure: POST PARTUM TUBAL LIGATION;  Surgeon: Scheryl Darter, MD;  Location: [facility];  Service: Gynecology;  Laterality: Bilateral;  Induced for HTN    FAMILY HISTORY Family History  Problem Relation Age of Onset  . Diabetes Maternal Grandmother   . Heart disease Maternal Grandmother   The patient's parents are alive. The patient has one brother and one sister.  There is no ovarian or breast cancer in the immediate family, but  the patient's maternal grandmother, who was one of 10 sisters, had one sister with breast cancer diagnosed in her sixties.    GYNECOLOGIC HISTORY: updated OCT 2013 The patient is GX P4, first pregnancy to term at age 70.  She stopped having periods at the time of chemotherapy, and had had no periods for the last 2 years, then became pregnant. This was a surprise to her, and she was not aware of the pregnancy until the  seventh month. She has not resumed normal menses, but has had some "spotting" on and off for the last 3 months.  SOCIAL HISTORY: updated OCT 2013 She has been a Social research officer, government in the past.   Erica [name] has four children,  currently 71 months old, 60 years old, 5 and 38 years old. They're all with her in [place]. 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 Smoking status: Never Smoker   . Smokeless tobacco: Never Used  . Alcohol Use: No     Colonoscopy:  PAP:  Bone density:  Lipid panel:  Allergies  Allergen Reactions  . Aspirin Shortness Of Breath and Palpitations    Pt can take ibuprofen without reaction  . Penicillins Shortness Of Breath and Palpitations    Current Outpatient Prescriptions  Medication Sig Dispense Refill  . albuterol (PROVENTIL) (2.5 MG/3ML) 0.083% nebulizer solution Take 3 mLs (2.5 mg total) by nebulization every 4 (four) hours as needed.  75 mL  1  . amLODipine (NORVASC) 10 MG tablet Take 1 tablet (10 mg total) by mouth daily.  30 tablet  1  . Fluticasone-Salmeterol (ADVAIR) 250-50 MCG/DOSE AEPB Inhale 1 puff into the lungs every 12 (twelve) hours.  60 each  1  . hydrochlorothiazide (HYDRODIURIL) 25 MG tablet Take 1 tablet (25 mg total) by mouth daily.  30 tablet  1  . ipratropium (ATROVENT) 0.02 % nebulizer solution Take 2.5 mLs (500 mcg total) by nebulization 2 (two) times daily as needed. For shortness of breath  75 mL  1  . labetalol (NORMODYNE) 200 MG tablet Take 2 tablets (400 mg total) by mouth 2 (two) times daily.  120 tablet  1  . Mometasone Furo-Formoterol Fum (DULERA) 200-5 MCG/ACT AERO Inhale 2 puffs into the lungs 2 (two) times daily.  1 Inhaler  6  . montelukast (SINGULAIR) 10 MG tablet Take 1 tablet (10 mg total) by mouth at bedtime.  30 tablet  1  . acetaZOLAMIDE (DIAMOX) 500 MG capsule Take 1 capsule (500 mg total) by mouth every 12 (twelve) hours.  60 capsule  0  . potassium  chloride (K-DUR) 10 MEQ tablet Take 2 tablets (20 mEq total) by mouth 1 day or 1 dose.  30 tablet  0    OBJECTIVE: Young [place] American female who appears well Filed Vitals:   12/31/11 1523  BP: 167/133  Pulse: 106  Temp: 97.9 F (36.6 C)  Resp: 20     Body mass index is 34.55 kg/(m^2).    ECOG FS: 0  Sclerae unicteric; decreased vision in the R eye as previously noted Oropharynx clear No cervical or supraclavicular adenopathy Lungs no rales or rhonchi Heart regular rate and rhythm Abd soft, nontender, positive bowel sounds MSK no focal spinal tenderness, no peripheral edema Neuro: nonfocal Breasts: The right breast is unremarkable. The left breast is status post lumpectomy and radiation. There is no evidence of local recurrence.  LAB RESULTS: Lab Results  Component Value Date  WBC 6.0 09/18/2011   NEUTROABS 2.7 09/17/2011   HGB 14.3 09/18/2011   HCT 41.2 09/18/2011   MCV 89.8 09/18/2011   PLT 214 09/18/2011      Chemistry      Component Value Date/Time   NA 134* 09/21/2011 0535   K 3.4* 09/21/2011 0535   CL 100 09/21/2011 0535   CO2 25 09/21/2011 0535   BUN 25* 09/21/2011 0535   CREATININE 1.62* 09/21/2011 0535   CREATININE 0.64 05/02/2011 1021      Component Value Date/Time   CALCIUM 9.5 09/21/2011 0535   ALKPHOS 50 09/18/2011 0530   AST 23 09/18/2011 0530   ALT 31 09/18/2011 0530   BILITOT 0.4 09/18/2011 0530       Lab Results  Component Value Date   LABCA2 11 05/30/2011    No components found with this basename: [id number]    No results found for this basename: INR:1;PROTIME:1 in the last 168 hours  Urinalysis    Component Value Date/Time   COLORURINE RED* 09/17/2011 2131   APPEARANCEUR CLOUDY* 09/17/2011 2131   LABSPEC 1.024 09/17/2011 2131   PHURINE 6.0 09/17/2011 2131   GLUCOSEU NEGATIVE 09/17/2011 2131   HGBUR LARGE* 09/17/2011 2131   BILIRUBINUR NEGATIVE 09/17/2011 2131   KETONESUR TRACE* 09/17/2011 2131   PROTEINUR 100* 09/17/2011 2131   UROBILINOGEN 1.0  09/17/2011 2131   NITRITE NEGATIVE 09/17/2011 2131   LEUKOCYTESUR TRACE* 09/17/2011 2131    STUDIES: No results found.  ASSESSMENT: 36 y.o. [place] woman with history of locally advanced left breast carcinoma initially presenting in March 2010   (1) treated neoadjuvantly with 4 cycles of docetaxel/ cyclophosphamide.    (2) status post left lumpectomy and sentinel lymph node dissection in July 2010 for a ypT2 ypN1, stage IIB invasive ductal carcinoma, grade 3, triple negative with MIB-1 of 88%.   (3) status post full axillary dissection and margin clearance in August 2010.   (4) status post 11 doses of weekly paclitaxel completed in December 2010   (5) radiation therapy completed in March 2011.   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 Her blood pressure continues to be out of control. She tells me she is taking her medications, but also wanted me to refill all her medications today (I did, giving her a years worth of refills). She does not have a gynecologist now and I have urged her to contact 1 close to where she lives. She tells me she is going to be seeing Dr. Oliver Barre and Dr. Maris Berger for further followup. She is overdue for mammography and that is being set up for later this week. Otherwise Keiosha will return to see [place] in February. She knows to call for any problems that may develop before the next visit.  MAGRINAT,GUSTAV C    12/31/2011

## 2011-12-31 NOTE — Telephone Encounter (Signed)
gve the pt her mamm[facility] appt at [facility] for this Friday along with the jan-feb 2014 appts. Pt is scheduled with dr Karlyn Agee in dec with [place] [facility]

## 2012-01-03 ENCOUNTER — Ambulatory Visit
Admission: RE | Admit: 2012-01-03 | Discharge: 2012-01-03 | Disposition: A | Discharge status: Home / Self Care | Payer: Self-pay | Source: Ambulatory Visit | Attending: Oncology | Admitting: Oncology

## 2012-01-03 DIAGNOSIS — C50919 Malignant neoplasm of unspecified site of unspecified female breast: Secondary | ICD-10-CM

## 2012-01-07 ENCOUNTER — Encounter (HOSPITAL_COMMUNITY): Payer: Self-pay | Admitting: Emergency Medicine

## 2012-01-07 ENCOUNTER — Emergency Department (HOSPITAL_COMMUNITY)
Admission: EM | Admit: 2012-01-07 | Discharge: 2012-01-07 | Disposition: A | Discharge status: Home / Self Care | Payer: Self-pay | Attending: Emergency Medicine | Admitting: Emergency Medicine

## 2012-01-07 DIAGNOSIS — I10 Essential (primary) hypertension: Secondary | ICD-10-CM | POA: Insufficient documentation

## 2012-01-07 DIAGNOSIS — Z886 Allergy status to analgesic agent status: Secondary | ICD-10-CM | POA: Insufficient documentation

## 2012-01-07 DIAGNOSIS — M25569 Pain in unspecified knee: Secondary | ICD-10-CM | POA: Insufficient documentation

## 2012-01-07 DIAGNOSIS — Z880 Allergy status to penicillin: Secondary | ICD-10-CM | POA: Insufficient documentation

## 2012-01-07 DIAGNOSIS — G589 Mononeuropathy, unspecified: Secondary | ICD-10-CM | POA: Insufficient documentation

## 2012-01-07 DIAGNOSIS — J45909 Unspecified asthma, uncomplicated: Secondary | ICD-10-CM | POA: Insufficient documentation

## 2012-01-07 DIAGNOSIS — K219 Gastro-esophageal reflux disease without esophagitis: Secondary | ICD-10-CM | POA: Insufficient documentation

## 2012-01-07 DIAGNOSIS — I889 Nonspecific lymphadenitis, unspecified: Secondary | ICD-10-CM | POA: Insufficient documentation

## 2012-01-07 DIAGNOSIS — D649 Anemia, unspecified: Secondary | ICD-10-CM | POA: Insufficient documentation

## 2012-01-07 MED ORDER — HYDROCODONE-ACETAMINOPHEN 5-325 MG PO TABS: ORAL_TABLET | ORAL | Status: DC

## 2012-01-07 MED ORDER — PREDNISONE 20 MG PO TABS: 40.0000 mg | ORAL_TABLET | Freq: Every day | ORAL | Status: DC

## 2012-01-07 NOTE — ED Notes (Signed)
Pt stated that she woke up with right knee pain, knee has gradually swollen over yesterday at today.

## 2012-01-07 NOTE — ED Provider Notes (Signed)
Medical screening examination/treatment/procedure(s) were performed by non-physician practitioner and as supervising physician I was immediately available for consultation/collaboration.   Derold Dorsch Y. Garret Teale, MD 01/07/12 2356 

## 2012-01-07 NOTE — ED Provider Notes (Signed)
History     CSN: [id number]  Arrival date & time 01/07/12  4098   First MD Initiated Contact with Patient 01/07/12 1958      Chief Complaint  Patient presents with  . Knee Pain    (Consider location/radiation/quality/duration/timing/severity/associated sxs/prior treatment) HPI Comments: Patient presents with complaint of right knee swelling that began yesterday morning. Patient awoke with pain. She denies any injury or strenuous activity on her legs which could have started this. She has not had these symptoms in the past. Patient has been treating at home with Tylenol and heating pads which have not helped. Patient states that walking makes the pain worse. Patient denies fever, vomiting. She has not had any recent surgeries or injections into the knee. Patient does not have diabetes. Onset gradual. Course gradually worsening. No h/o gout or arthritis.   Patient is a 36 y.o. female presenting with knee pain. The history is provided by the patient.  Knee Pain Associated symptoms include arthralgias and joint swelling. Pertinent negatives include no neck pain, numbness or weakness.    Past Medical History  Diagnosis Date  . Breast cancer 2010  . Asthma   . Hypertension   . GERD (gastroesophageal reflux disease)   . Impaired glucose tolerance 04/13/2011  . Anemia, unspecified 04/13/2011  . Asthma 09/27/2010  . Bloating 03/18/2011  . 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 Lymphadenitis, chronic     restricted LEFT extremity    Past Surgical History  Procedure Date  . Hand surgery   . Breast lumpectomy 09/2008  . Carpal tunnel release     left hand  . Tubal ligation 05/11/2011    Procedure: POST PARTUM TUBAL LIGATION;  Surgeon: Scheryl Darter, MD;  Location: [facility];  Service: Gynecology;  Laterality: Bilateral;  Induced for HTN    Family History  Problem Relation Age of Onset  . Diabetes Maternal Grandmother   . Heart disease Maternal Grandmother     History  Substance Use  Topics  . Smoking status: Never Smoker   . Smokeless tobacco: Never Used  . Alcohol Use: No    OB History    Grav Para Term Preterm Abortions TAB SAB Ect Mult Living   6 4 4  1  1   4       Review of Systems  Constitutional: Positive for activity change.  HENT: Negative for neck pain.   Musculoskeletal: Positive for joint swelling, arthralgias and gait problem. Negative for back pain.  Skin: Negative for wound.  Neurological: Negative for weakness and numbness.    Allergies  Aspirin and Penicillins  Home Medications   Current Outpatient Rx  Name Route Sig Dispense Refill  . ALBUTEROL SULFATE (2.5 MG/3ML) 0.083% IN NEBU Nebulization Take 2.5 mg by nebulization every 6 (six) hours as needed. For shortness of breath.    . AMLODIPINE BESYLATE 10 MG PO TABS Oral Take 1 tablet (10 mg total) by mouth daily. 30 tablet 1  . FLUTICASONE-SALMETEROL 250-50 MCG/DOSE IN AEPB Inhalation Inhale 1 puff into the lungs every 12 (twelve) hours. 60 each 1  . HYDROCHLOROTHIAZIDE 25 MG PO TABS Oral Take 1 tablet (25 mg total) by mouth daily. 30 tablet 12  . IPRATROPIUM BROMIDE 0.02 % IN SOLN Nebulization Take 2.5 mLs (500 mcg total) by nebulization 2 (two) times daily as needed. For shortness of breath 75 mL 12  . LABETALOL HCL 200 MG PO TABS Oral Take 2 tablets (400 mg total) by mouth 2 (two) times daily. 120  tablet 12  . MOMETASONE FURO-FORMOTEROL FUM 200-5 MCG/ACT IN AERO Inhalation Inhale 2 puffs into the lungs 2 (two) times daily. 1 Inhaler 6  . MONTELUKAST SODIUM 10 MG PO TABS Oral Take 1 tablet (10 mg total) by mouth at bedtime. 30 tablet 12  . POTASSIUM CHLORIDE CRYS ER 10 MEQ PO TBCR Oral Take 20 mEq by mouth daily.      BP 173/103  Pulse 103  Temp 98.6 F (37 C) (Oral)  Resp 18  Ht 5' 6.5" (1.689 m)  Wt 207 lb (93.895 kg)  BMI 32.91 kg/m2  SpO2 96%  Breastfeeding? No  Physical Exam  Nursing note and vitals reviewed. Constitutional: She appears well-developed and well-nourished.    HENT:  Head: Normocephalic and atraumatic.  Eyes: Pupils are equal, round, and reactive to light.  Neck: Normal range of motion. Neck supple.  Cardiovascular: Exam reveals no decreased pulses.   Musculoskeletal: She exhibits tenderness. She exhibits no edema.       Right hip: Normal.       Right knee: She exhibits effusion (mild). She exhibits normal range of motion and no bony tenderness. tenderness found. Medial joint line and lateral joint line tenderness noted. No patellar tendon tenderness noted.       Right ankle: Normal.       Mild warmth of knee without overlying erythema or suggestion of cellulitis. Patient can fully and actively flex and extend her knee but states she has some pain with movement.  Neurological: She is alert. No sensory deficit.       Motor, sensation, and vascular distal to the injury is fully intact.   Skin: Skin is warm and dry.  Psychiatric: She has a normal mood and affect.    ED Course  Procedures (including critical care time)  Labs Reviewed - No data to display No results found.   1. Knee pain     9:02 PM Patient seen and examined.   Vital signs reviewed and are as follows: Filed Vitals:   01/07/12 1823  BP: 173/103  Pulse: 103  Temp: 98.6 F (37 C)  Resp: 18   Crutches and knee sleeve by orthopedic tech.  Will treat with pain medication and anti-inflammatory medication. Patient given orthopedic referral if no improvement in one week.  Patient counseled on use of narcotic pain medications. Counseled not to combine these medications with others containing tylenol. Urged not to drink alcohol, drive, or perform any other activities that requires focus while taking these medications. The patient verbalizes understanding and agrees with the plan.  MDM  Patient with 2 days of right knee inflammation and swelling. Do not suspect septic arthritis. Possibly inflammation 2/2 posterior arthritis, gout, pseudogout. Will treat inflammation with  prednisone given patient's history of high blood pressure and inability to tolerate ibuprofen and naproxen.        Renne Crigler, [place] 01/07/12 2113

## 2012-03-02 ENCOUNTER — Encounter: Payer: Self-pay | Admitting: Internal Medicine

## 2012-03-02 ENCOUNTER — Other Ambulatory Visit (INDEPENDENT_AMBULATORY_CARE_PROVIDER_SITE_OTHER): Payer: Self-pay

## 2012-03-02 ENCOUNTER — Ambulatory Visit (INDEPENDENT_AMBULATORY_CARE_PROVIDER_SITE_OTHER): Payer: Self-pay | Admitting: Internal Medicine

## 2012-03-02 VITALS — BP 156/98 | HR 108 | Temp 98.1°F | Resp 16 | Wt 200.8 lb

## 2012-03-02 DIAGNOSIS — D649 Anemia, unspecified: Secondary | ICD-10-CM

## 2012-03-02 DIAGNOSIS — R7309 Other abnormal glucose: Secondary | ICD-10-CM

## 2012-03-02 DIAGNOSIS — I10 Essential (primary) hypertension: Secondary | ICD-10-CM

## 2012-03-02 DIAGNOSIS — R7302 Impaired glucose tolerance (oral): Secondary | ICD-10-CM

## 2012-03-02 DIAGNOSIS — Z23 Encounter for immunization: Secondary | ICD-10-CM

## 2012-03-02 LAB — LIPID PANEL
Cholesterol: 176 mg/dL (ref 0–200)
LDL Cholesterol: 98 mg/dL (ref 0–99)
Triglycerides: 154 mg/dL — ABNORMAL HIGH (ref 0.0–149.0)

## 2012-03-02 LAB — COMPREHENSIVE METABOLIC PANEL
AST: 26 U/L (ref 0–37)
Albumin: 3.9 g/dL (ref 3.5–5.2)
BUN: 14 mg/dL (ref 6–23)
CO2: 25 mEq/L (ref 19–32)
Calcium: 9 mg/dL (ref 8.4–10.5)
Chloride: 104 mEq/L (ref 96–112)
Creatinine, Ser: 1.1 mg/dL (ref 0.4–1.2)
GFR: 73.63 mL/min (ref >60.00)
Glucose, Bld: 105 mg/dL — ABNORMAL HIGH (ref 70–99)

## 2012-03-02 LAB — CBC WITH DIFFERENTIAL/PLATELET
Basophils Relative: 0.4 % (ref 0.0–3.0)
Hemoglobin: 14.6 g/dL (ref 12.0–15.0)
Lymphocytes Relative: 25.1 % (ref 12.0–46.0)
Monocytes Relative: 8.2 % (ref 3.0–12.0)
Neutro Abs: 3.8 10*3/uL (ref 1.4–7.7)
Neutrophils Relative %: 63.6 % (ref 43.0–77.0)
RBC: 4.63 Mil/uL (ref 3.87–5.11)
WBC: 5.9 10*3/uL (ref 4.5–10.5)

## 2012-03-02 LAB — URINALYSIS, ROUTINE W REFLEX MICROSCOPIC
Bilirubin Urine: NEGATIVE
Leukocytes, UA: NEGATIVE
Urine Glucose: NEGATIVE
pH: 6 (ref 5.0–8.0)

## 2012-03-02 LAB — TSH: TSH: 1.91 u[IU]/mL (ref 0.35–5.50)

## 2012-03-02 MED ORDER — OLMESARTAN-AMLODIPINE-HCTZ 40-5-25 MG PO TABS: 1.0000 | ORAL_TABLET | Freq: Every day | ORAL | Status: DC

## 2012-03-02 NOTE — Assessment & Plan Note (Signed)
Repeat CBC today 

## 2012-03-02 NOTE — Assessment & Plan Note (Signed)
I will check her a1c to see if she has developed DM II 

## 2012-03-02 NOTE — Patient Instructions (Signed)

## 2012-03-02 NOTE — Progress Notes (Signed)
Subjective:    Patient ID: Erica [name], female    DOB: 26-Apr-1975, 36 y.o.   MRN: [id number]  Hypertension This is a chronic problem. The current episode started more than 1 year ago. The problem is unchanged. The problem is uncontrolled. Pertinent negatives include no anxiety, blurred vision, chest pain, headaches, malaise/fatigue, neck pain, orthopnea, palpitations, peripheral edema, PND, shortness of breath or sweats. Past treatments include beta blockers, calcium channel blockers and diuretics. The current treatment provides moderate improvement. Compliance problems include exercise and diet.  Hypertensive end-organ damage includes kidney disease and CVA (right retinal infarct). Identifiable causes of hypertension include chronic renal disease.      Review of Systems  Constitutional: Negative for fever, chills, malaise/fatigue, diaphoresis, activity change, appetite change, fatigue and unexpected weight change.  HENT: Negative.  Negative for neck pain.   Eyes: Negative.  Negative for blurred vision.  Respiratory: Negative for cough, chest tightness, shortness of breath and wheezing.   Cardiovascular: Negative for chest pain, palpitations, orthopnea, leg swelling and PND.  Gastrointestinal: Negative for nausea, vomiting, abdominal pain, diarrhea, constipation and blood in stool.  Genitourinary: Negative for dysuria, urgency, frequency, hematuria, decreased urine volume, enuresis, difficulty urinating and dyspareunia.  Musculoskeletal: Negative for myalgias, back pain, joint swelling, arthralgias and gait problem.  Skin: Negative for color change, pallor, rash and wound.  Neurological: Negative for dizziness, tremors, weakness, light-headedness, numbness and headaches.  Hematological: Negative for adenopathy. Does not bruise/bleed easily.  Psychiatric/Behavioral: Negative.        Objective:   Physical Exam  Vitals reviewed. Constitutional: She is oriented to person, place, and time.  She appears well-developed and well-nourished. No distress.  HENT:  Head: Normocephalic and atraumatic.  Mouth/Throat: Oropharynx is clear and moist. No oropharyngeal exudate.  Eyes: Conjunctivae normal are normal. Right eye exhibits no discharge. Left eye exhibits no discharge. No scleral icterus.  Neck: Normal range of motion. Neck supple. No JVD present. No tracheal deviation present. No thyromegaly present.  Cardiovascular: Normal rate, regular rhythm, normal heart sounds and intact distal pulses.  Exam reveals no gallop and no friction rub.   No murmur heard. Pulmonary/Chest: Effort normal and breath sounds normal. No stridor. No respiratory distress. She has no wheezes. She has no rales. She exhibits no tenderness.  Abdominal: Soft. Bowel sounds are normal. She exhibits no distension and no mass. There is no tenderness. There is no rebound and no guarding.  Musculoskeletal: Normal range of motion. She exhibits no edema and no tenderness.  Lymphadenopathy:    She has no cervical adenopathy.  Neurological: She is oriented to person, place, and time.  Skin: Skin is warm and dry. No rash noted. She is not diaphoretic. No erythema. No pallor.  Psychiatric: She has a normal mood and affect. Her behavior is normal. Judgment and thought content normal.      Lab Results  Component Value Date   WBC 6.0 09/18/2011   HGB 14.3 09/18/2011   HCT 41.2 09/18/2011   PLT 214 09/18/2011   GLUCOSE 107* 09/21/2011   CHOL 153 09/18/2011   TRIG 133 09/18/2011   HDL 43 09/18/2011   LDLCALC 83 09/18/2011   ALT 31 09/18/2011   AST 23 09/18/2011   NA 134* 09/21/2011   K 3.4* 09/21/2011   CL 100 09/21/2011   CREATININE 1.62* 09/21/2011   BUN 25* 09/21/2011   CO2 25 09/21/2011   TSH 2.648 09/18/2011   INR 0.95 09/18/2011   HGBA1C 5.4 09/18/2011  Assessment & Plan:

## 2012-03-02 NOTE — Assessment & Plan Note (Signed)
Her BP is not well controlled I will recheck her lytes and renal function today She will stay on labetolol, I will add benicar and consolidate into tribenzor I have asked her to improve on her lifestyle modifications

## 2012-03-07 ENCOUNTER — Encounter (HOSPITAL_COMMUNITY): Payer: Self-pay | Admitting: *Deleted

## 2012-03-07 ENCOUNTER — Emergency Department (HOSPITAL_COMMUNITY): Payer: Self-pay

## 2012-03-07 ENCOUNTER — Emergency Department (HOSPITAL_COMMUNITY)
Admission: EM | Admit: 2012-03-07 | Discharge: 2012-03-07 | Disposition: A | Discharge status: Home / Self Care | Payer: Self-pay | Attending: Emergency Medicine | Admitting: Emergency Medicine

## 2012-03-07 DIAGNOSIS — Z79899 Other long term (current) drug therapy: Secondary | ICD-10-CM | POA: Insufficient documentation

## 2012-03-07 DIAGNOSIS — K219 Gastro-esophageal reflux disease without esophagitis: Secondary | ICD-10-CM | POA: Insufficient documentation

## 2012-03-07 DIAGNOSIS — Z8669 Personal history of other diseases of the nervous system and sense organs: Secondary | ICD-10-CM | POA: Insufficient documentation

## 2012-03-07 DIAGNOSIS — J45909 Unspecified asthma, uncomplicated: Secondary | ICD-10-CM | POA: Insufficient documentation

## 2012-03-07 DIAGNOSIS — I889 Nonspecific lymphadenitis, unspecified: Secondary | ICD-10-CM | POA: Insufficient documentation

## 2012-03-07 DIAGNOSIS — Z853 Personal history of malignant neoplasm of breast: Secondary | ICD-10-CM | POA: Insufficient documentation

## 2012-03-07 DIAGNOSIS — I10 Essential (primary) hypertension: Secondary | ICD-10-CM | POA: Insufficient documentation

## 2012-03-07 DIAGNOSIS — Z862 Personal history of diseases of the blood and blood-forming organs and certain disorders involving the immune mechanism: Secondary | ICD-10-CM | POA: Insufficient documentation

## 2012-03-07 LAB — URINALYSIS, ROUTINE W REFLEX MICROSCOPIC
Glucose, UA: NEGATIVE mg/dL
Protein, ur: NEGATIVE mg/dL
Specific Gravity, Urine: 1.006 (ref 1.005–1.030)
Urobilinogen, UA: 0.2 mg/dL (ref 0.0–1.0)

## 2012-03-07 LAB — COMPREHENSIVE METABOLIC PANEL
ALT: 33 U/L (ref 0–35)
BUN: 11 mg/dL (ref 6–23)
CO2: 20 mEq/L (ref 19–32)
Calcium: 9.1 mg/dL (ref 8.4–10.5)
Creatinine, Ser: 0.87 mg/dL (ref 0.50–1.10)
GFR calc Af Amer: >90 mL/min (ref >90)
GFR calc non Af Amer: 83 mL/min — ABNORMAL LOW (ref >90)
Glucose, Bld: 99 mg/dL (ref 70–99)

## 2012-03-07 LAB — CBC WITH DIFFERENTIAL/PLATELET
Basophils Absolute: 0 10*3/uL (ref 0.0–0.1)
Basophils Relative: 1 % (ref 0–1)
Hemoglobin: 15.3 g/dL — ABNORMAL HIGH (ref 12.0–15.0)
Lymphocytes Relative: 42 % (ref 12–46)
MCHC: 35.5 g/dL (ref 30.0–36.0)
Monocytes Relative: 6 % (ref 3–12)
Neutro Abs: 2.2 10*3/uL (ref 1.7–7.7)
Neutrophils Relative %: 49 % (ref 43–77)
WBC: 4.4 10*3/uL (ref 4.0–10.5)

## 2012-03-07 LAB — POCT PREGNANCY, URINE: Preg Test, Ur: NEGATIVE

## 2012-03-07 MED ORDER — SODIUM CHLORIDE 0.9 % IV SOLN
INTRAVENOUS | Status: DC
  Administered 2012-03-07: 08:00:00 via INTRAVENOUS

## 2012-03-07 MED ORDER — GI COCKTAIL ~~LOC~~
30.0000 mL | Freq: Once | ORAL | Status: AC
  Administered 2012-03-07: 30 mL via ORAL
  Filled 2012-03-07: qty 30

## 2012-03-07 MED ORDER — PANTOPRAZOLE SODIUM 20 MG PO TBEC: 20.0000 mg | DELAYED_RELEASE_TABLET | Freq: Every day | ORAL | Status: DC

## 2012-03-07 MED ORDER — SUCRALFATE 1 G PO TABS: 1.0000 g | ORAL_TABLET | Freq: Four times a day (QID) | ORAL | Status: DC

## 2012-03-07 MED ORDER — FAMOTIDINE 20 MG PO TABS
40.0000 mg | ORAL_TABLET | Freq: Once | ORAL | Status: AC
  Administered 2012-03-07: 40 mg via ORAL
  Filled 2012-03-07: qty 2

## 2012-03-07 NOTE — ED Notes (Addendum)
Pt reports sharp, burning LUQ pain since 3a today. Movement and inspiration makes it worse. Pt in NAD, laughing in triage, talking about wanting to eat.

## 2012-03-07 NOTE — ED Provider Notes (Addendum)
History     CSN: [id number]  Arrival date & time 03/07/12  0727   First MD Initiated Contact with Patient 03/07/12 [phone]      Chief Complaint  Patient presents with  . Abdominal Pain    (Consider location/radiation/quality/duration/timing/severity/associated sxs/prior treatment) Patient is a 36 y.o. female presenting with abdominal pain. The history is provided by the patient.  Abdominal Pain The primary symptoms of the illness include abdominal pain.  pt with luq abd pain x 8 hours--no fever, vomiting, diarrhea--nothing makes sx better or worse--no pleuritic pain, cough , or dyspnea--no rashes--no prior h/o same--no tx done pta, no urinary sx  Past Medical History  Diagnosis Date  . Breast cancer 2010  . Asthma   . Hypertension   . GERD (gastroesophageal reflux disease)   . Impaired glucose tolerance 04/13/2011  . Anemia, unspecified 04/13/2011  . Asthma 09/27/2010  . Bloating 03/18/2011  . 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 Lymphadenitis, chronic     restricted LEFT extremity    Past Surgical History  Procedure Date  . Hand surgery   . Breast lumpectomy 09/2008  . Carpal tunnel release     left hand  . Tubal ligation 05/11/2011    Procedure: POST PARTUM TUBAL LIGATION;  Surgeon: Scheryl Darter, MD;  Location: [facility];  Service: Gynecology;  Laterality: Bilateral;  Induced for HTN    Family History  Problem Relation Age of Onset  . Diabetes Maternal Grandmother   . Heart disease Maternal Grandmother   . Cancer Neg Hx   . Alcohol abuse Neg Hx   . Early death Neg Hx   . Hyperlipidemia Neg Hx   . Hypertension Neg Hx   . Kidney disease Neg Hx   . Stroke Neg Hx     History  Substance Use Topics  . Smoking status: Never Smoker   . Smokeless tobacco: Never Used  . Alcohol Use: 0.0 oz/week     Comment: occasionally    OB History    Grav Para Term Preterm Abortions TAB SAB Ect Mult Living   6 4 4  1  1   4       Review of Systems  Gastrointestinal: Positive for  abdominal pain.  All other systems reviewed and are negative.    Allergies  Aspirin and Penicillins  Home Medications   Current Outpatient Rx  Name  Route  Sig  Dispense  Refill  . ALBUTEROL SULFATE (2.5 MG/3ML) 0.083% IN NEBU   Nebulization   Take 2.5 mg by nebulization every 6 (six) hours as needed. For shortness of breath.         Marland Kitchen FLUTICASONE-SALMETEROL 250-50 MCG/DOSE IN AEPB   Inhalation   Inhale 1 puff into the lungs every 12 (twelve) hours.   60 each   1   . IPRATROPIUM BROMIDE 0.02 % IN SOLN   Nebulization   Take 2.5 mLs (500 mcg total) by nebulization 2 (two) times daily as needed. For shortness of breath   75 mL   12   . LABETALOL HCL 200 MG PO TABS   Oral   Take 2 tablets (400 mg total) by mouth 2 (two) times daily.   120 tablet   12   . MONTELUKAST SODIUM 10 MG PO TABS   Oral   Take 1 tablet (10 mg total) by mouth at bedtime.   30 tablet   12   . OLMESARTAN-AMLODIPINE-HCTZ 40-5-25 MG PO TABS   Oral   Take 1 tablet  by mouth daily.   140 tablet   0   . POTASSIUM CHLORIDE CRYS ER 10 MEQ PO TBCR   Oral   Take 20 mEq by mouth daily.           BP 148/131  Pulse 105  Temp 98.7 F (37.1 C) (Oral)  Resp 20  Ht 5\' 7"  (1.702 m)  Wt 200 lb (90.719 kg)  BMI 31.32 kg/m2  SpO2 98%  LMP 03/01/2012  Physical Exam  Nursing note and vitals reviewed. Constitutional: She is oriented to person, place, and time. She appears well-developed and well-nourished.  Non-toxic appearance. No distress.  HENT:  Head: Normocephalic and atraumatic.  Eyes: Conjunctivae normal, EOM and lids are normal. Pupils are equal, round, and reactive to light.  Neck: Normal range of motion. Neck supple. No tracheal deviation present. No mass present.  Cardiovascular: Normal rate, regular rhythm and normal heart sounds.  Exam reveals no gallop.   No murmur heard. Pulmonary/Chest: Effort normal and breath sounds normal. No stridor. No respiratory distress. She has no decreased  breath sounds. She has no wheezes. She has no rhonchi. She has no rales.  Abdominal: Soft. Normal appearance and bowel sounds are normal. She exhibits no distension. There is tenderness in the left upper quadrant. There is no rigidity, no rebound and no CVA tenderness.  Musculoskeletal: Normal range of motion. She exhibits no edema and no tenderness.  Neurological: She is alert and oriented to person, place, and time. She has normal strength. No cranial nerve deficit or sensory deficit. GCS eye subscore is 4. GCS verbal subscore is 5. GCS motor subscore is 6.  Skin: Skin is warm and dry. No abrasion and no rash noted.  Psychiatric: She has a normal mood and affect. Her speech is normal and behavior is normal.    ED Course  Procedures (including critical care time)   Labs Reviewed  COMPREHENSIVE METABOLIC PANEL  CBC WITH DIFFERENTIAL  LIPASE, BLOOD  URINALYSIS, ROUTINE W REFLEX MICROSCOPIC   No results found.   No diagnosis found.    MDM  Pt given meds for relfux and feels better--  10:15 AM abd examined and non-surgical--stable for discharge  No concern for pe      Toy Baker, MD 03/07/12 1017  Toy Baker, MD 03/07/12 1020

## 2012-04-29 ENCOUNTER — Other Ambulatory Visit: Payer: Self-pay | Admitting: Physician Assistant

## 2012-04-29 DIAGNOSIS — C50919 Malignant neoplasm of unspecified site of unspecified female breast: Secondary | ICD-10-CM

## 2012-04-30 ENCOUNTER — Other Ambulatory Visit: Payer: Self-pay | Admitting: Lab

## 2012-05-01 ENCOUNTER — Telehealth: Payer: Self-pay | Admitting: Oncology

## 2012-05-01 ENCOUNTER — Other Ambulatory Visit (HOSPITAL_BASED_OUTPATIENT_CLINIC_OR_DEPARTMENT_OTHER): Payer: Self-pay

## 2012-05-01 DIAGNOSIS — C50919 Malignant neoplasm of unspecified site of unspecified female breast: Secondary | ICD-10-CM

## 2012-05-01 DIAGNOSIS — C50419 Malignant neoplasm of upper-outer quadrant of unspecified female breast: Secondary | ICD-10-CM

## 2012-05-01 LAB — CBC WITH DIFFERENTIAL/PLATELET
Basophils Absolute: 0.1 10*3/uL (ref 0.0–0.1)
EOS%: 1.7 % (ref 0.0–7.0)
HCT: 46 % (ref 34.8–46.6)
HGB: 15.8 g/dL (ref 11.6–15.9)
LYMPH%: 28.7 % (ref 14.0–49.7)
MCH: 31.5 pg (ref 25.1–34.0)
MCV: 91.7 fL (ref 79.5–101.0)
MONO%: 8.8 % (ref 0.0–14.0)
NEUT%: 59.8 % (ref 38.4–76.8)

## 2012-05-01 LAB — COMPREHENSIVE METABOLIC PANEL (CC13)
Albumin: 3.6 g/dL (ref 3.5–5.0)
Alkaline Phosphatase: 62 U/L (ref 40–150)
BUN: 10.1 mg/dL (ref 7.0–26.0)
Glucose: 97 mg/dl (ref 70–99)
Total Bilirubin: 0.68 mg/dL (ref 0.20–1.20)

## 2012-05-01 NOTE — Telephone Encounter (Signed)
Pt missed lab..i r/s as a add on today...the patient at lab

## 2012-05-05 ENCOUNTER — Encounter: Payer: Self-pay | Admitting: Oncology

## 2012-05-05 NOTE — Progress Notes (Signed)
Called patient to see if bank account or car and she said no to both. She has letter of support with her application. I advised her would process.

## 2012-05-06 ENCOUNTER — Encounter: Payer: Self-pay | Admitting: Oncology

## 2012-05-06 NOTE — Progress Notes (Signed)
Processed the patient's application for assistance.. Family of 4(there is 18 child in home-did not include) no income/job. There is a letter of support from mother. 100% indigent 05/06/12-11/03/12. I will send letter and green card to the patient.

## 2012-05-07 ENCOUNTER — Encounter: Payer: Self-pay | Admitting: Physician Assistant

## 2012-05-07 ENCOUNTER — Ambulatory Visit (HOSPITAL_BASED_OUTPATIENT_CLINIC_OR_DEPARTMENT_OTHER): Payer: Self-pay | Admitting: Physician Assistant

## 2012-05-07 ENCOUNTER — Telehealth: Payer: Self-pay | Admitting: Oncology

## 2012-05-07 VITALS — BP 213/126 | HR 121 | Temp 98.7°F | Resp 20 | Ht 67.0 in | Wt 204.4 lb

## 2012-05-07 DIAGNOSIS — J45909 Unspecified asthma, uncomplicated: Secondary | ICD-10-CM

## 2012-05-07 DIAGNOSIS — C50419 Malignant neoplasm of upper-outer quadrant of unspecified female breast: Secondary | ICD-10-CM

## 2012-05-07 DIAGNOSIS — R109 Unspecified abdominal pain: Secondary | ICD-10-CM

## 2012-05-07 DIAGNOSIS — R1012 Left upper quadrant pain: Secondary | ICD-10-CM

## 2012-05-07 DIAGNOSIS — Z171 Estrogen receptor negative status [ER-]: Secondary | ICD-10-CM

## 2012-05-07 DIAGNOSIS — C50919 Malignant neoplasm of unspecified site of unspecified female breast: Secondary | ICD-10-CM

## 2012-05-07 DIAGNOSIS — I10 Essential (primary) hypertension: Secondary | ICD-10-CM

## 2012-05-07 MED ORDER — IPRATROPIUM BROMIDE 0.02 % IN SOLN: 500.0000 ug | Freq: Two times a day (BID) | RESPIRATORY_TRACT | Status: DC | PRN

## 2012-05-07 MED ORDER — ALBUTEROL SULFATE (2.5 MG/3ML) 0.083% IN NEBU: 2.5000 mg | INHALATION_SOLUTION | Freq: Four times a day (QID) | RESPIRATORY_TRACT | Status: DC | PRN

## 2012-05-07 MED ORDER — MOMETASONE FURO-FORMOTEROL FUM 100-5 MCG/ACT IN AERO: 2.0000 | INHALATION_SPRAY | Freq: Two times a day (BID) | RESPIRATORY_TRACT | Status: DC

## 2012-05-07 NOTE — Progress Notes (Signed)
ID: [name]   DOB: 14-Jun-1975  MR#: [id number]  CSN#:[id number]  WUJ:[id number] Erica Barre, MD GYN: SU:  Harriette Bouillon, MD Other:  Sandrea Hughs, MD;  Erick Blinks, MD  HISTORY OF PRESENT ILLNESS: Erica [name] noticed a lump in her left breast sometime in August or September 2009.  Initially this was thought to possibly be "dry breast milk" but as the mass got bigger, she brought it to her physician's attention and was set up for mammography.  This was performed May 20, 2008 at [facility] and it showed a palpable 8.5 cm oval circumscribed mass in the left breast upper outer quadrant.  There were prominent left axillary lymph nodes noted as well.  An ultrasound showed this to be hypoechoic, with lobulated margins and probable central necrosis measuring up to 7.3 cm on this modality.  Ultrasound of the left axilla demonstrated numerous prominent lymph nodes at the upper limit of normal, the largest measuring 1 cm with a normal appearing thin cortex and a normal cortex to halo ratio.    Because the patient had no insurance, she was referred to [facility] and biopsy was performed in [place] with the pathology there ([id number]) showing a poorly differentiated malignancy which was triple negative (ER 1%, PR 0%, HER-2/neu 0).  The axillary lymph nodes were not biopsied. With this information, the patient was presented at the Breast Multidisciplinary Conference and the feeling was that, after appropriate staging, the patient would be a good candidate for neoadjuvant treatment and possibly NSABP B40.  Her subsequent history is as detailed below  INTERVAL HISTORY: Erica [name] returns today for followup of her left breast cancer. She is primarily living in [place], [place], but travels to [place] at least once monthly. Should like to continue receiving her health care here in [place].  Interval history is remarkable for recent visit to the emergency room due to abdominal pain, primarily isolated to the left  upper quadrant. This has been an ongoing problem for [name] and she tells me that happens proximally 4 times weekly with a "terrible pain" that lasted at least 10-15 minutes. She's having regular bowel movements, with no evidence of blood in the stool. She's had no increased nausea or emesis. No explained weight loss.  Otherwise, Erica [name]'s family is doing well. Her youngest son is getting ready to her in 26-year-old next week.  REVIEW OF SYSTEMS: Erica [name] has had no fevers or chills. She has hot flashes. She's had no skin changes and denies abnormal bleeding. She's had no abnormal vaginal bleeding. She continues to have fatigue, and often has difficulty sleeping. She is followed by Dr.  Sherene Sires for asthma and continues to have a chronic cough with shortness of breath. She denies any chest pain or palpitations. She's been sitting Dr. Yetta [name] at [facility] for her hypertension.  Her blood pressure was quite elevated here in the office today, but she tells me "it has been better at home". She has decreased vision in the right eye, but this has not worsened or changed and she denies any abnormal headaches.  A detailed review of systems is otherwise stable and noncontributory.   PAST MEDICAL HISTORY: Past Medical History  Diagnosis Date  . Breast cancer 2010  . Asthma   . Hypertension   . GERD (gastroesophageal reflux disease)   . Impaired glucose tolerance 04/13/2011  . Anemia, unspecified 04/13/2011  . Asthma 09/27/2010  . Bloating 03/18/2011  . 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 Lymphadenitis, chronic     restricted  LEFT extremity    PAST SURGICAL HISTORY: Past Surgical History  Procedure Date  . Hand surgery   . Breast lumpectomy 09/2008  . Carpal tunnel release     left hand  . Tubal ligation 05/11/2011    Procedure: POST PARTUM TUBAL LIGATION;  Surgeon: Scheryl Darter, MD;  Location: [facility];  Service: Gynecology;  Laterality: Bilateral;  Induced for HTN    FAMILY HISTORY Family History  Problem  Relation Age of Onset  . Diabetes Maternal Grandmother   . Heart disease Maternal Grandmother   . Cancer Neg Hx   . Alcohol abuse Neg Hx   . Early death Neg Hx   . Hyperlipidemia Neg Hx   . Hypertension Neg Hx   . Kidney disease Neg Hx   . Stroke Neg Hx   The patient's parents are alive. The patient has one brother and one sister.  There is no ovarian or breast cancer in the immediate family, but the patient's maternal grandmother, who was one of 10 sisters, had one sister with breast cancer diagnosed in her sixties.    GYNECOLOGIC HISTORY: updated OCT 2013 The patient is GX P4, first pregnancy to term at age 55.  She stopped having periods at the time of chemotherapy, and had had no periods for the last 2 years, then became pregnant. This was a surprise to her, and she was not aware of the pregnancy until the seventh month. She has not resumed normal menses, but has had some "spotting" on and off for the last 3 months.  SOCIAL HISTORY: updated OCT 2013 She has been a Social research officer, government in the past.   Erica [name] has four children,  currently 76 months old, 26 years old, 84 and 39 years old. They're all with her in [place]. 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 Smoking status: Never Smoker   . Smokeless tobacco: Never Used  . Alcohol Use: 0.0 oz/week     Comment: occasionally     Colonoscopy:  PAP:  Bone density:  Lipid panel:  Allergies  Allergen Reactions  . Aspirin Shortness Of Breath and Palpitations    Pt can take ibuprofen without reaction  . Penicillins Shortness Of Breath and Palpitations    Current Outpatient Prescriptions  Medication Sig Dispense Refill  . albuterol (PROVENTIL) (2.5 MG/3ML) 0.083% nebulizer solution Take 3 mLs (2.5 mg total) by nebulization every 6 (six) hours as needed. For shortness of breath.  75 mL  5  . Fluticasone-Salmeterol (ADVAIR) 250-50 MCG/DOSE AEPB Inhale 1 puff into  the lungs every 12 (twelve) hours.  60 each  1  . ipratropium (ATROVENT) 0.02 % nebulizer solution Take 2.5 mLs (500 mcg total) by nebulization 2 (two) times daily as needed. For shortness of breath  75 mL  5  . labetalol (NORMODYNE) 200 MG tablet Take 2 tablets (400 mg total) by mouth 2 (two) times daily.  120 tablet  12  . mometasone-formoterol (DULERA) 100-5 MCG/ACT AERO Inhale 2 puffs into the lungs 2 (two) times daily.  13 g  5  . montelukast (SINGULAIR) 10 MG tablet Take 1 tablet (10 mg total) by mouth at bedtime.  30 tablet  12  . Olmesartan-Amlodipine-HCTZ (TRIBENZOR) 40-5-25 MG TABS Take 1 tablet by mouth daily.  140 tablet  0  . pantoprazole (PROTONIX) 20 MG tablet Take 1 tablet (20 mg total) by mouth daily.  30 tablet  0  . potassium  chloride (K-DUR,KLOR-CON) 10 MEQ tablet Take 10 mEq by mouth 2 (two) times daily.       . sucralfate (CARAFATE) 1 G tablet Take 1 tablet (1 g total) by mouth 4 (four) times daily.  30 tablet  0    OBJECTIVE: Young African American female in no acute distress Filed Vitals:   05/07/12 1425  BP: 213/126  Pulse: 121  Temp: 98.7 F (37.1 C)  Resp: 20     Body mass index is 32.01 kg/(m^2).    ECOG FS: 1 Filed Weights   05/07/12 1425  Weight: 204 lb 6.4 oz (92.715 kg)    Sclerae unicteric Oropharynx clear No cervical or supraclavicular adenopathy Lungs no rales or rhonchi Heart regular rate and rhythm Abd soft, mild tenderness to palpation in the left upper quadrant, positive bowel sounds MSK no focal spinal tenderness, no peripheral edema Neuro: nonfocal, well oriented Breasts: The right breast is unremarkable. The left breast is status post lumpectomy and radiation. There is some tenderness to palpation, but  no evidence of local recurrence. Axillae are benign bilaterally with no palpable adenopathy.   LAB RESULTS: Lab Results  Component Value Date   WBC 6.2 05/01/2012   NEUTROABS 3.7 05/01/2012   HGB 15.8 05/01/2012   HCT 46.0 05/01/2012    MCV 91.7 05/01/2012   PLT 186 05/01/2012      Chemistry      Component Value Date/Time   NA 138 05/01/2012 1553   NA 137 03/07/2012 0830   K 3.6 05/01/2012 1553   K 3.5 03/07/2012 0830   CL 103 05/01/2012 1553   CL 100 03/07/2012 0830   CO2 25 05/01/2012 1553   CO2 20 03/07/2012 0830   BUN 10.1 05/01/2012 1553   BUN 11 03/07/2012 0830   CREATININE 0.9 05/01/2012 1553   CREATININE 0.87 03/07/2012 0830   CREATININE 0.64 05/02/2011 1021      Component Value Date/Time   CALCIUM 9.2 05/01/2012 1553   CALCIUM 9.1 03/07/2012 0830   ALKPHOS 62 05/01/2012 1553   ALKPHOS 54 03/07/2012 0830   AST 29 05/01/2012 1553   AST 29 03/07/2012 0830   ALT 41 05/01/2012 1553   ALT 33 03/07/2012 0830   BILITOT 0.68 05/01/2012 1553   BILITOT 0.2* 03/07/2012 0830       Lab Results  Component Value Date   LABCA2 11 05/01/2012     STUDIES:   01/03/2012 *RADIOLOGY REPORT*  Clinical Data: Patient presents for a bilateral diagnostic  mammogram due to a history of prior left malignant lumpectomy 2010.  DIGITAL DIAGNOSTIC BILATERAL MAMMOGRAM WITH CAD  Comparison: 08/22/2010, 05/22/2009, 10/27/2008 and 05/20/2008  Findings: Examination demonstrates scattered fibroglandular  densities. There are stable post lumpectomy changes of the upper  outer left breast. Remainder of the exam is unchanged.  Mammographic images were processed with CAD.  IMPRESSION:  Stable post lumpectomy changes of the upper outer left breast.  RECOMMENDATION:  Recommend continued annual bilateral diagnostic mammographic  evaluation.  BI-RADS CATEGORY 2: Benign finding(s).  .  Original Report Authenticated By: Elba Barman, M.D.    03/07/2013 *RADIOLOGY REPORT*  Clinical Data: Left mid abdominal pain, cough  ACUTE ABDOMEN SERIES (ABDOMEN 2 VIEW & CHEST 1 VIEW)  Comparison: 11/16/2010  Findings: Cardiomediastinal silhouette is stable. No acute  infiltrate or pleural effusion. No pulmonary edema.  There is nonspecific  nonobstructive bowel gas pattern. Stool noted  in the right colon and proximal transverse colon. No free  abdominal air. Post tubal ligation  surgical clips are noted within  pelvis.  IMPRESSION:  No acute disease. Stool noted in proximal colon. Nonspecific  nonobstructive bowel gas pattern.  Original Report Authenticated By: Natasha Mead, M.D.   ASSESSMENT: 37 y.o. [place] woman with history of locally advanced left breast carcinoma initially presenting in March 2010   (1) treated neoadjuvantly with 4 cycles of docetaxel/ cyclophosphamide.    (2) status post left lumpectomy and sentinel lymph node dissection in July 2010 for a ypT2 ypN1, stage IIB invasive ductal carcinoma, grade 3, triple negative with MIB-1 of 88%.   (3) status post full axillary dissection and margin clearance in August 2010.   (4) status post 11 doses of weekly paclitaxel completed in December 2010   (5) radiation therapy completed in March 2011.   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 I continue to be concerned about her blood pressure which is being followed by Dr. Sanda Linger, and she scheduled to see him again in approximately 4 weeks. She'll continue to follow her blood pressure at home.  I am also concerned about the left upper quadrant abdominal pain that Equilla is having. This was evaluated recently in the emergency room, with no definitive diagnosis. She has seen Dr. Erick Blinks in the past, and would like a referral back to his office for further evaluation.  Ayo has asked me to refill her asthma medications, pending appointment with Dr. Sherene Sires in the next couple of months. Accordingly, I have refilled her albuterol nebulizer, Atrovent nebulizer, and Dulera inhaler.    Selda will return to see [place] in 4 months for routine labs and  physical exam, but knows to call prior that time with any changes or problems.   Jacquita Mulhearn    05/07/2012

## 2012-05-18 ENCOUNTER — Encounter: Payer: Self-pay | Admitting: Internal Medicine

## 2012-05-19 ENCOUNTER — Ambulatory Visit (INDEPENDENT_AMBULATORY_CARE_PROVIDER_SITE_OTHER): Payer: Self-pay | Admitting: Internal Medicine

## 2012-05-19 ENCOUNTER — Encounter: Payer: Self-pay | Admitting: Internal Medicine

## 2012-05-19 VITALS — BP 160/90 | HR 103 | Ht 66.0 in | Wt 203.8 lb

## 2012-05-19 DIAGNOSIS — Z853 Personal history of malignant neoplasm of breast: Secondary | ICD-10-CM

## 2012-05-19 DIAGNOSIS — K219 Gastro-esophageal reflux disease without esophagitis: Secondary | ICD-10-CM

## 2012-05-19 DIAGNOSIS — R1012 Left upper quadrant pain: Secondary | ICD-10-CM

## 2012-05-19 MED ORDER — TRAMADOL HCL 50 MG PO TABS: 50.0000 mg | ORAL_TABLET | Freq: Four times a day (QID) | ORAL | Status: DC | PRN

## 2012-05-19 MED ORDER — HYOSCYAMINE SULFATE 0.125 MG SL SUBL: 0.1250 mg | SUBLINGUAL_TABLET | SUBLINGUAL | Status: DC | PRN

## 2012-05-19 NOTE — Progress Notes (Signed)
Patient ID: Erica [name], female   DOB: February 25, 1976, 37 y.o.   MRN: [id number]  SUBJECTIVE: HPI Erica [name] is a 37 yo female with PMH of breast cancer status post lumpectomy with chemotherapy/XRT felt currently to be in remission, asthma, hypertension, GERD who is seen in followup for evaluation of left upper quadrant pain. She was last seen in December 2012 and was found to be approximately 5 or 6 months pregnant at that time. This pregnancy was unknown to her, but she reports having delivered a healthy 6 pound baby boy almost exactly one year ago. He is doing well now. She reports that she continues to have intermittent left upper quadrant abdominal pain which is sharp in nature. She reports this is occurring episodically as many as 3 times a day and lasts for 10-15 minutes each time. The pain feels like a "knot" at times she feels may even be palpable in her left upper quadrant. This pain is worse with movement. It is not seem to relate to eating or bowel movements. She does report a somewhat decreased appetite, but no nausea or vomiting. She reports normal bowel frequency and habits with a bowel movement 2-3 times per day which is formed. No blood in her stool or melena. She denies fevers or chills. She denies heartburn, dysphagia or odynophagia.  Review of Systems  As per history of present illness, otherwise negative   Past Medical History  Diagnosis Date  . Breast cancer 2010  . Asthma   . Hypertension   . GERD (gastroesophageal reflux disease)   . Impaired glucose tolerance 04/13/2011  . Anemia, unspecified 04/13/2011  . Asthma 09/27/2010  . Bloating 03/18/2011  . 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 Lymphadenitis, chronic     restricted LEFT extremity    Current Outpatient Prescriptions  Medication Sig Dispense Refill  . albuterol (PROVENTIL) (2.5 MG/3ML) 0.083% nebulizer solution Take 3 mLs (2.5 mg total) by nebulization every 6 (six) hours as needed. For shortness of breath.  75 mL  5   . Fluticasone-Salmeterol (ADVAIR) 250-50 MCG/DOSE AEPB Inhale 1 puff into the lungs every 12 (twelve) hours.  60 each  1  . ipratropium (ATROVENT) 0.02 % nebulizer solution Take 2.5 mLs (500 mcg total) by nebulization 2 (two) times daily as needed. For shortness of breath  75 mL  5  . labetalol (NORMODYNE) 200 MG tablet Take 2 tablets (400 mg total) by mouth 2 (two) times daily.  120 tablet  12  . mometasone-formoterol (DULERA) 100-5 MCG/ACT AERO Inhale 2 puffs into the lungs 2 (two) times daily.  13 g  5  . montelukast (SINGULAIR) 10 MG tablet Take 1 tablet (10 mg total) by mouth at bedtime.  30 tablet  12  . Olmesartan-Amlodipine-HCTZ (TRIBENZOR) 40-5-25 MG TABS Take 1 tablet by mouth daily.  140 tablet  0  . pantoprazole (PROTONIX) 20 MG tablet Take 1 tablet (20 mg total) by mouth daily.  30 tablet  0  . potassium chloride (K-DUR,KLOR-CON) 10 MEQ tablet Take 10 mEq by mouth 2 (two) times daily.       . sucralfate (CARAFATE) 1 G tablet Take 1 tablet (1 g total) by mouth 4 (four) times daily.  30 tablet  0  . hyoscyamine (LEVSIN/SL) 0.125 MG SL tablet Place 1 tablet (0.125 mg total) under the tongue every 4 (four) hours as needed for cramping.  30 tablet  0  . traMADol (ULTRAM) 50 MG tablet Take 1 tablet (50 mg total) by mouth  every 6 (six) hours as needed for pain.  30 tablet  0   No current facility-administered medications for this visit.    Allergies  Allergen Reactions  . Aspirin Shortness Of Breath and Palpitations    Pt can take ibuprofen without reaction  . Penicillins Shortness Of Breath and Palpitations    Family History  Problem Relation Age of Onset  . Diabetes Maternal Grandmother   . Heart disease Maternal Grandmother   . Cancer Neg Hx   . Alcohol abuse Neg Hx   . Early death Neg Hx   . Hyperlipidemia Neg Hx   . Hypertension Neg Hx   . Kidney disease Neg Hx   . Stroke Neg Hx     History  Substance Use Topics  . Smoking status: Never Smoker   . Smokeless tobacco:  Never Used  . Alcohol Use: 0.0 oz/week     Comment: occasionally    OBJECTIVE: BP 160/90  Pulse 103  Ht 5\' 6"  (1.676 m)  Wt 203 lb 12.8 oz (92.443 kg)  BMI 32.91 kg/m2  SpO2 98% Constitutional: Well-developed and well-nourished. No distress. HEENT: Normocephalic and atraumatic. Oropharynx is clear and moist. No oropharyngeal exudate. Conjunctivae are normal. No scleral icterus. Neck: Neck supple. Trachea midline. Cardiovascular: Mild tachycardia, regular rhythm and intact distal pulses. No M/R/G Pulmonary/chest: Effort normal and breath sounds normal. No wheezing, rales or rhonchi. Abdominal: Soft, there is left upper quadrant tenderness to palpation with some tenderness over the left lower anterior rib cage without palpable mass, nondistended. Bowel sounds active throughout. No hepatosplenomegaly, though exam somewhat limited by habitus Extremities: no clubbing, cyanosis, or edema Lymphadenopathy: No cervical adenopathy noted. Neurological: Alert and oriented to person place and time. Skin: Skin is warm and dry. No rashes noted. Psychiatric: Normal mood and affect. Behavior is normal.  Labs CMP     Component Value Date/Time   NA 138 05/01/2012 1553   NA 137 03/07/2012 0830   K 3.6 05/01/2012 1553   K 3.5 03/07/2012 0830   CL 103 05/01/2012 1553   CL 100 03/07/2012 0830   CO2 25 05/01/2012 1553   CO2 20 03/07/2012 0830   GLUCOSE 97 05/01/2012 1553   GLUCOSE 99 03/07/2012 0830   BUN 10.1 05/01/2012 1553   BUN 11 03/07/2012 0830   CREATININE 0.9 05/01/2012 1553   CREATININE 0.87 03/07/2012 0830   CREATININE 0.64 05/02/2011 1021   CALCIUM 9.2 05/01/2012 1553   CALCIUM 9.1 03/07/2012 0830   PROT 7.7 05/01/2012 1553   PROT 7.8 03/07/2012 0830   ALBUMIN 3.6 05/01/2012 1553   ALBUMIN 3.9 03/07/2012 0830   AST 29 05/01/2012 1553   AST 29 03/07/2012 0830   ALT 41 05/01/2012 1553   ALT 33 03/07/2012 0830   ALKPHOS 62 05/01/2012 1553   ALKPHOS 54 03/07/2012 0830   BILITOT 0.68 05/01/2012 1553    BILITOT 0.2* 03/07/2012 0830   GFRNONAA 83* 03/07/2012 0830   GFRAA >90 03/07/2012 0830    CBC    Component Value Date/Time   WBC 6.2 05/01/2012 1553   WBC 4.4 03/07/2012 0830   RBC 5.02 05/01/2012 1553   RBC 4.81 03/07/2012 0830   HGB 15.8 05/01/2012 1553   HGB 15.3* 03/07/2012 0830   HCT 46.0 05/01/2012 1553   HCT 43.1 03/07/2012 0830   PLT 186 05/01/2012 1553   PLT 252 03/07/2012 0830   MCV 91.7 05/01/2012 1553   MCV 89.6 03/07/2012 0830   MCH 31.5 05/01/2012 1553   MCH  31.8 03/07/2012 0830   MCHC 34.3 05/01/2012 1553   MCHC 35.5 03/07/2012 0830   RDW 13.5 05/01/2012 1553   RDW 12.7 03/07/2012 0830   LYMPHSABS 1.8 05/01/2012 1553   LYMPHSABS 1.9 03/07/2012 0830   MONOABS 0.5 05/01/2012 1553   MONOABS 0.3 03/07/2012 0830   EOSABS 0.1 05/01/2012 1553   EOSABS 0.1 03/07/2012 0830   BASOSABS 0.1 05/01/2012 1553   BASOSABS 0.0 03/07/2012 0830    Cancer antigen 2729 - recently stable per oncology  ASSESSMENT AND PLAN: 37 yo female with PMH of breast cancer status post lumpectomy with chemotherapy/XRT felt currently to be in remission, asthma, hypertension, GERD who is seen in followup for evaluation of left upper quadrant pain.  1.  LUQ pain -- it is difficult to ascertain the etiology of her episodic left upper quadrant pain. I'm not convinced that this pain is GI related. She is worried that she may have recurrent cancer, though her recent lab work per oncology was reassuring. It is possible that her pain is musculoskeletal in nature given that it is worse with movement. I have recommended CT scan of the abdomen and pelvis with contrast to better evaluate this pain. I will also give her prescription for Levsin to be used as an anti-spasmodic to see if this provides benefit. She will be given tramadol 50 mg to be used as needed and as directed for pain. If the CT scan is unremarkable we may proceed to upper endoscopy to rule out gastroduodenitis or ulcer disease, though her current symptom description  would be atypical for such.  2.  GERD -- symptoms well controlled on once daily pantoprazole 20 mg. She will continue with this dose.

## 2012-05-19 NOTE — Patient Instructions (Addendum)
We have sent the following medications to your pharmacy for you to pick up at your convenience: Levsin and Tramadol; please take as directed     You have been scheduled for a CT scan of the abdomen and pelvis at [facility] ([address]---this is in the same building as Architectural technologist).   You are scheduled on 05/22/2012 at 1:00. You should arrive 15 minutes prior to your appointment time for registration. Please follow the written instructions below on the day of your exam:  WARNING: IF YOU ARE ALLERGIC TO IODINE/X-RAY DYE, PLEASE NOTIFY RADIOLOGY IMMEDIATELY AT [phone]! YOU WILL BE GIVEN A 13 HOUR PREMEDICATION PREP.  1) Do not eat or drink anything after 9:00am (4 hours prior to your test) 2) You have been given 2 bottles of oral contrast to drink. The solution may taste better if refrigerated, but do NOT add ice or any other liquid to this solution. Shake well before drinking.    Drink 1 bottle of contrast @ 11:00 (2 hours prior to your exam)  Drink 1 bottle of contrast @ 12:00 (1 hour prior to your exam)  You may take any medications as prescribed with a small amount of water except for the following: Metformin, Glucophage, Glucovance, Avandamet, Riomet, Fortamet, Actoplus Met, Janumet, Glumetza or Metaglip. The above medications must be held the day of the exam AND 48 hours after the exam.  The purpose of you drinking the oral contrast is to aid in the visualization of your intestinal tract. The contrast solution may cause some diarrhea. Before your exam is started, you will be given a small amount of fluid to drink. Depending on your individual set of symptoms, you may also receive an intravenous injection of x-ray contrast/dye. Plan on being at [facility] for 30 minutes or long, depending on the type of exam you are having performed.  If you have any questions regarding your exam or if you need to reschedule, you may call the CT department at [phone]  between the hours of 8:00 am and 5:00 pm, Monday-Friday.  ________________________________________________________________________

## 2012-05-22 ENCOUNTER — Ambulatory Visit (INDEPENDENT_AMBULATORY_CARE_PROVIDER_SITE_OTHER)
Admission: RE | Admit: 2012-05-22 | Discharge: 2012-05-22 | Disposition: A | Discharge status: Home / Self Care | Payer: Self-pay | Source: Ambulatory Visit | Attending: Internal Medicine | Admitting: Internal Medicine

## 2012-05-22 DIAGNOSIS — Z853 Personal history of malignant neoplasm of breast: Secondary | ICD-10-CM

## 2012-05-22 DIAGNOSIS — R1012 Left upper quadrant pain: Secondary | ICD-10-CM

## 2012-05-22 MED ORDER — IOHEXOL 300 MG/ML  SOLN
100.0000 mL | Freq: Once | INTRAMUSCULAR | Status: AC | PRN
  Administered 2012-05-22: 100 mL via INTRAVENOUS

## 2012-05-27 ENCOUNTER — Encounter: Payer: Self-pay | Admitting: Oncology

## 2012-05-27 NOTE — Progress Notes (Signed)
Received return mail for the approval letter for assistance. I sent to [address] Erica [name] [place], [place] [address]- I tried to call to confirm and can't leave a message on voice mail.

## 2012-05-27 NOTE — Progress Notes (Signed)
We got returned mail and I tried to call the patient all day and [phone] and it will not let me leave a message. I went ahead and mailed the letter/card to [address] [place], [place] [address]

## 2012-05-28 ENCOUNTER — Telehealth: Payer: Self-pay | Admitting: *Deleted

## 2012-05-28 NOTE — Telephone Encounter (Signed)
Unable to reach pt by phone for CT results; mailed her a letter.

## 2012-06-03 ENCOUNTER — Ambulatory Visit (INDEPENDENT_AMBULATORY_CARE_PROVIDER_SITE_OTHER): Payer: Self-pay | Admitting: Internal Medicine

## 2012-06-03 ENCOUNTER — Encounter: Payer: Self-pay | Admitting: Internal Medicine

## 2012-06-03 ENCOUNTER — Other Ambulatory Visit (INDEPENDENT_AMBULATORY_CARE_PROVIDER_SITE_OTHER): Payer: Self-pay

## 2012-06-03 VITALS — BP 160/82 | HR 80 | Temp 98.7°F | Resp 16 | Wt 203.0 lb

## 2012-06-03 DIAGNOSIS — E876 Hypokalemia: Secondary | ICD-10-CM

## 2012-06-03 DIAGNOSIS — I10 Essential (primary) hypertension: Secondary | ICD-10-CM

## 2012-06-03 DIAGNOSIS — R233 Spontaneous ecchymoses: Secondary | ICD-10-CM

## 2012-06-03 LAB — CBC WITH DIFFERENTIAL/PLATELET
Basophils Relative: 1.2 % (ref 0.0–3.0)
Eosinophils Absolute: 0.1 10*3/uL (ref 0.0–0.7)
Hemoglobin: 14.3 g/dL (ref 12.0–15.0)
Lymphocytes Relative: 25.4 % (ref 12.0–46.0)
MCHC: 34.6 g/dL (ref 30.0–36.0)
MCV: 91.2 fl (ref 78.0–100.0)
Neutro Abs: 3.3 10*3/uL (ref 1.4–7.7)
RBC: 4.52 Mil/uL (ref 3.87–5.11)

## 2012-06-03 LAB — BASIC METABOLIC PANEL
BUN: 8 mg/dL (ref 6–23)
CO2: 26 mEq/L (ref 19–32)
Chloride: 104 mEq/L (ref 96–112)
Creatinine, Ser: 1.1 mg/dL (ref 0.4–1.2)
Glucose, Bld: 99 mg/dL (ref 70–99)

## 2012-06-03 LAB — PROTIME-INR: Prothrombin Time: 11.2 s (ref 10.2–12.4)

## 2012-06-03 MED ORDER — CLONIDINE HCL 0.1 MG PO TABS: 0.1000 mg | ORAL_TABLET | Freq: Two times a day (BID) | ORAL | Status: DC

## 2012-06-03 NOTE — Assessment & Plan Note (Signed)
Her BP is not well controlled so I have asked her to add clonidine. I will recheck her renal function and lytes today.

## 2012-06-03 NOTE — Patient Instructions (Signed)

## 2012-06-03 NOTE — Progress Notes (Signed)
  Subjective:    Patient ID: Erica [name], female    DOB: 1976/02/12, 37 y.o.   MRN: [id number]  Hypertension This is a chronic problem. The current episode started more than 1 year ago. The problem is unchanged. The problem is uncontrolled. Associated symptoms include headaches. Pertinent negatives include no anxiety, blurred vision, chest pain, malaise/fatigue, neck pain, orthopnea, palpitations, peripheral edema, PND, shortness of breath or sweats. Agents associated with hypertension include NSAIDs. Past treatments include beta blockers, diuretics, calcium channel blockers and angiotensin blockers. The current treatment provides mild improvement. Compliance problems include exercise and diet.       Review of Systems  Constitutional: Negative for fever, chills, malaise/fatigue, diaphoresis, activity change, appetite change, fatigue and unexpected weight change.  HENT: Negative for nosebleeds, sore throat, mouth sores and neck pain.   Eyes: Negative.  Negative for blurred vision.  Respiratory: Negative for cough, chest tightness, shortness of breath, wheezing and stridor.   Cardiovascular: Negative for chest pain, palpitations, orthopnea, leg swelling and PND.  Gastrointestinal: Negative for nausea, vomiting, abdominal pain, diarrhea, constipation, blood in stool and anal bleeding.  Endocrine: Negative.   Genitourinary: Positive for menstrual problem (heavy periods). Negative for hematuria.  Musculoskeletal: Negative for myalgias, back pain, joint swelling, arthralgias and gait problem.  Skin: Positive for color change. Negative for pallor, rash and wound.  Allergic/Immunologic: Negative.   Neurological: Positive for headaches. Negative for dizziness, tremors, seizures, syncope, facial asymmetry, speech difficulty, weakness, light-headedness and numbness.  Hematological: Negative for adenopathy. Bruises/bleeds easily (she feels like she bruises easily, today she has one small bruise on her  right upper arm).  Psychiatric/Behavioral: Negative.        Objective:   Physical Exam  Skin:         Lab Results  Component Value Date   WBC 6.2 05/01/2012   HGB 15.8 05/01/2012   HCT 46.0 05/01/2012   PLT 186 05/01/2012   GLUCOSE 97 05/01/2012   CHOL 176 03/02/2012   TRIG 154.0* 03/02/2012   HDL 47.70 03/02/2012   LDLCALC 98 03/02/2012   ALT 41 05/01/2012   AST 29 05/01/2012   NA 138 05/01/2012   K 3.6 05/01/2012   CL 103 05/01/2012   CREATININE 0.9 05/01/2012   BUN 10.1 05/01/2012   CO2 25 05/01/2012   TSH 1.91 03/02/2012   INR 0.95 09/18/2011   HGBA1C 5.2 03/02/2012      Assessment & Plan:

## 2012-06-03 NOTE — Assessment & Plan Note (Signed)
I think this is caused by the aleve she takes. I doubt that there is anything pathological about the bruises but will check her platelets and clotting factors today.

## 2012-06-04 ENCOUNTER — Encounter: Payer: Self-pay | Admitting: Internal Medicine

## 2012-06-04 DIAGNOSIS — E876 Hypokalemia: Secondary | ICD-10-CM | POA: Insufficient documentation

## 2012-06-04 MED ORDER — POTASSIUM CHLORIDE CRYS ER 20 MEQ PO TBCR: 20.0000 meq | EXTENDED_RELEASE_TABLET | Freq: Three times a day (TID) | ORAL | Status: DC

## 2012-06-04 NOTE — Addendum Note (Signed)
Addended by: [name] on: 06/04/2012 07:48 AM   Modules accepted: Orders

## 2012-06-20 ENCOUNTER — Emergency Department (HOSPITAL_COMMUNITY)
Admission: EM | Admit: 2012-06-20 | Discharge: 2012-06-20 | Disposition: A | Discharge status: Home / Self Care | Payer: Self-pay | Attending: Emergency Medicine | Admitting: Emergency Medicine

## 2012-06-20 ENCOUNTER — Emergency Department (HOSPITAL_COMMUNITY): Payer: Self-pay

## 2012-06-20 ENCOUNTER — Encounter (HOSPITAL_COMMUNITY): Payer: Self-pay | Admitting: Emergency Medicine

## 2012-06-20 DIAGNOSIS — Z79899 Other long term (current) drug therapy: Secondary | ICD-10-CM | POA: Insufficient documentation

## 2012-06-20 DIAGNOSIS — J45909 Unspecified asthma, uncomplicated: Secondary | ICD-10-CM | POA: Insufficient documentation

## 2012-06-20 DIAGNOSIS — I10 Essential (primary) hypertension: Secondary | ICD-10-CM | POA: Insufficient documentation

## 2012-06-20 DIAGNOSIS — Z8669 Personal history of other diseases of the nervous system and sense organs: Secondary | ICD-10-CM | POA: Insufficient documentation

## 2012-06-20 DIAGNOSIS — S93602A Unspecified sprain of left foot, initial encounter: Secondary | ICD-10-CM

## 2012-06-20 DIAGNOSIS — S93609A Unspecified sprain of unspecified foot, initial encounter: Secondary | ICD-10-CM | POA: Insufficient documentation

## 2012-06-20 DIAGNOSIS — Z8719 Personal history of other diseases of the digestive system: Secondary | ICD-10-CM | POA: Insufficient documentation

## 2012-06-20 DIAGNOSIS — X500XXA Overexertion from strenuous movement or load, initial encounter: Secondary | ICD-10-CM | POA: Insufficient documentation

## 2012-06-20 DIAGNOSIS — IMO0002 Reserved for concepts with insufficient information to code with codable children: Secondary | ICD-10-CM | POA: Insufficient documentation

## 2012-06-20 DIAGNOSIS — Z853 Personal history of malignant neoplasm of breast: Secondary | ICD-10-CM | POA: Insufficient documentation

## 2012-06-20 DIAGNOSIS — Z862 Personal history of diseases of the blood and blood-forming organs and certain disorders involving the immune mechanism: Secondary | ICD-10-CM | POA: Insufficient documentation

## 2012-06-20 DIAGNOSIS — Y9389 Activity, other specified: Secondary | ICD-10-CM | POA: Insufficient documentation

## 2012-06-20 DIAGNOSIS — Y9289 Other specified places as the place of occurrence of the external cause: Secondary | ICD-10-CM | POA: Insufficient documentation

## 2012-06-20 MED ORDER — TRAMADOL HCL 50 MG PO TABS: 50.0000 mg | ORAL_TABLET | Freq: Four times a day (QID) | ORAL | Status: DC | PRN

## 2012-06-20 NOTE — ED Notes (Signed)
Per patient, she was helping a friend move 3-4 days ago, she tripped over a parking lot median. "My foot went one way and my body went the other".

## 2012-06-20 NOTE — ED Provider Notes (Signed)
Medical screening examination/treatment/procedure(s) were performed by non-physician practitioner and as supervising physician I was immediately available for consultation/collaboration.   Charles B. Bernette Mayers, MD 06/20/12 1610

## 2012-06-20 NOTE — ED Provider Notes (Signed)
History     CSN: [id number]  Arrival date & time 06/20/12  0251   First MD Initiated Contact with Patient 06/20/12 0256      Chief Complaint  Patient presents with  . Foot Pain    L foot    (Consider location/radiation/quality/duration/timing/severity/associated sxs/prior treatment) HPI  37 year old female with history of hypertension, presents complaining of foot injury. Patient reports she was helping a friend move some furniture several days ago when she accidentally tripped over a parking lot median and injured her left foot. States her foot twisted inward follows with acute onset of sharp throbbing pain to the top portion of the foot. Pain is nonradiating. She is able to ambulate afterward but having increasing pain with ambulation. Her pain is 6/10, intermittent, nonradiating, minimally improved with rest and taking over-the-counter pain medication. She denies any significant knee or hip pain. She endorses a mild tingling sensation to the toes but denies any acute loss of sensation.  No abnormal bleeding.    Past Medical History  Diagnosis Date  . Breast cancer 2010  . Asthma   . Hypertension   . GERD (gastroesophageal reflux disease)   . Impaired glucose tolerance 04/13/2011  . Anemia, unspecified 04/13/2011  . Asthma 09/27/2010  . Bloating 03/18/2011  . 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 Lymphadenitis, chronic     restricted LEFT extremity    Past Surgical History  Procedure Laterality Date  . Hand surgery    . Breast lumpectomy  09/2008  . Carpal tunnel release      left hand  . Tubal ligation  05/11/2011    Procedure: POST PARTUM TUBAL LIGATION;  Surgeon: Scheryl Darter, MD;  Location: [facility];  Service: Gynecology;  Laterality: Bilateral;  Induced for HTN    Family History  Problem Relation Age of Onset  . Diabetes Maternal Grandmother   . Heart disease Maternal Grandmother   . Cancer Neg Hx   . Alcohol abuse Neg Hx   . Early death Neg Hx   . Hyperlipidemia Neg  Hx   . Hypertension Neg Hx   . Kidney disease Neg Hx   . Stroke Neg Hx     History  Substance Use Topics  . Smoking status: Never Smoker   . Smokeless tobacco: Never Used  . Alcohol Use: No     Comment: occasionally    OB History   Grav Para Term Preterm Abortions TAB SAB Ect Mult Living   6 4 4  1  1   4       Review of Systems  Musculoskeletal: Positive for myalgias and arthralgias. Negative for joint swelling.  Neurological: Negative for numbness.    Allergies  Aspirin and Penicillins  Home Medications   Current Outpatient Rx  Name  Route  Sig  Dispense  Refill  . albuterol (PROVENTIL) (2.5 MG/3ML) 0.083% nebulizer solution   Nebulization   Take 3 mLs (2.5 mg total) by nebulization every 6 (six) hours as needed. For shortness of breath.   75 mL   5   . cloNIDine (CATAPRES) 0.1 MG tablet   Oral   Take 1 tablet (0.1 mg total) by mouth 2 (two) times daily.   60 tablet   11   . Fluticasone-Salmeterol (ADVAIR) 250-50 MCG/DOSE AEPB   Inhalation   Inhale 1 puff into the lungs every 12 (twelve) hours.   60 each   1   . hyoscyamine (LEVSIN/SL) 0.125 MG SL tablet   Sublingual   Place  1 tablet (0.125 mg total) under the tongue every 4 (four) hours as needed for cramping.   30 tablet   0   . ipratropium (ATROVENT) 0.02 % nebulizer solution   Nebulization   Take 2.5 mLs (500 mcg total) by nebulization 2 (two) times daily as needed. For shortness of breath   75 mL   5   . labetalol (NORMODYNE) 200 MG tablet   Oral   Take 2 tablets (400 mg total) by mouth 2 (two) times daily.   120 tablet   12   . mometasone-formoterol (DULERA) 100-5 MCG/ACT AERO   Inhalation   Inhale 2 puffs into the lungs 2 (two) times daily.   13 g   5   . montelukast (SINGULAIR) 10 MG tablet   Oral   Take 1 tablet (10 mg total) by mouth at bedtime.   30 tablet   12   . Olmesartan-Amlodipine-HCTZ (TRIBENZOR) 40-5-25 MG TABS   Oral   Take 1 tablet by mouth daily.   140 tablet    0   . pantoprazole (PROTONIX) 20 MG tablet   Oral   Take 1 tablet (20 mg total) by mouth daily.   30 tablet   0   . potassium chloride (K-DUR,KLOR-CON) 10 MEQ tablet   Oral   Take 10 mEq by mouth 2 (two) times daily.          . potassium chloride SA (K-DUR,KLOR-CON) 20 MEQ tablet   Oral   Take 1 tablet (20 mEq total) by mouth 3 (three) times daily.   90 tablet   3   . sucralfate (CARAFATE) 1 G tablet   Oral   Take 1 tablet (1 g total) by mouth 4 (four) times daily.   30 tablet   0   . traMADol (ULTRAM) 50 MG tablet   Oral   Take 1 tablet (50 mg total) by mouth every 6 (six) hours as needed for pain.   30 tablet   0     BP 201/103  Pulse 106  Temp(Src) 97.4 F (36.3 C) (Oral)  Resp 24  Ht 5\' 5"  (1.651 m)  Wt 203 lb (92.08 kg)  BMI 33.78 kg/m2  SpO2 98%  LMP 06/18/2012  Breastfeeding? Unknown  Physical Exam  Nursing note and vitals reviewed. Constitutional: She appears well-developed and well-nourished. No distress.  HENT:  Head: Atraumatic.  Eyes: Conjunctivae are normal.  Neck: Neck supple.  Musculoskeletal:       Left hip: Normal.       Left knee: Normal.       Left ankle: Normal.       Feet:  Neurological: She is alert.  Skin: Skin is warm. No rash noted.    ED Course  Procedures (including critical care time)  3:28 AM Pt has tenderness to proximal dorsum of L foot on palpation with mild edema.  No significant pain to L ankle. Able to bear weight.  Xray ordered.  Pain medication offered, pt declined.    3:55 AM Xray of L foot shows no acute fx or dislocation.  Pt likely sprain her ankle/foot.  Will provide ASO.  Pt able to bear weight.  RICE therapy discussed.  F/u with ortho as needed.  Pt voice understanding and agrees with plan.  Recommend repeat xray in 1 week if sxs not improve.    Pt also has elevated BP, last BP is 189/106.  However, pt sts this is her baseline BP.  She has been taking  her medication.  Denies headache, vision changes,  cp, sob.  No evidence suggestive of end organ damage.  I recommend f/u with her PCP for further management of her BP.    Labs Reviewed - No data to display No results found.   1. Foot sprain, left, initial encounter       MDM  BP 189/106  Pulse 102  Temp(Src) 97.4 F (36.3 C) (Oral)  Resp 24  Ht 5\' 5"  (1.651 m)  Wt 203 lb (92.08 kg)  BMI 33.78 kg/m2  SpO2 97%  LMP 06/18/2012  Breastfeeding? Unknown  I have reviewed nursing notes and vital signs. I personally reviewed the imaging tests through PACS system  I reviewed available ER/hospitalization records thought the EMR          Fayrene Helper, [place] 06/20/12 0408

## 2012-06-20 NOTE — ED Notes (Signed)
Patient reports taking multiple HTN meds, patient BP elevated at this time. Patient states she was feeling SOB with wheezing pta, used home neb treatment.

## 2012-07-06 ENCOUNTER — Telehealth: Payer: Self-pay | Admitting: Internal Medicine

## 2012-07-06 NOTE — Telephone Encounter (Signed)
Informed pt of CT results and if no better, she needs an EGD. Pt states she's in [facility], but we scheduled her for PV on 07/24/12 and her EGD on 07/28/12. Mailed her a schedule.

## 2012-07-24 ENCOUNTER — Ambulatory Visit (AMBULATORY_SURGERY_CENTER): Payer: Self-pay | Admitting: *Deleted

## 2012-07-24 VITALS — Ht 67.0 in | Wt 208.0 lb

## 2012-07-24 DIAGNOSIS — R1012 Left upper quadrant pain: Secondary | ICD-10-CM

## 2012-07-28 ENCOUNTER — Encounter: Payer: Self-pay | Admitting: Internal Medicine

## 2012-07-28 ENCOUNTER — Ambulatory Visit (AMBULATORY_SURGERY_CENTER): Payer: Self-pay | Admitting: Internal Medicine

## 2012-07-28 VITALS — BP 184/112 | HR 88 | Temp 99.1°F | Resp 16 | Ht 67.0 in | Wt 208.0 lb

## 2012-07-28 DIAGNOSIS — D131 Benign neoplasm of stomach: Secondary | ICD-10-CM

## 2012-07-28 DIAGNOSIS — K2990 Gastroduodenitis, unspecified, without bleeding: Secondary | ICD-10-CM

## 2012-07-28 DIAGNOSIS — D130 Benign neoplasm of esophagus: Secondary | ICD-10-CM

## 2012-07-28 DIAGNOSIS — R1012 Left upper quadrant pain: Secondary | ICD-10-CM

## 2012-07-28 DIAGNOSIS — K2970 Gastritis, unspecified, without bleeding: Secondary | ICD-10-CM

## 2012-07-28 MED ORDER — PANTOPRAZOLE SODIUM 40 MG PO TBEC: 40.0000 mg | DELAYED_RELEASE_TABLET | Freq: Two times a day (BID) | ORAL | Status: DC

## 2012-07-28 MED ORDER — SODIUM CHLORIDE 0.9 % IV SOLN: 500.0000 mL | INTRAVENOUS | Status: DC

## 2012-07-28 NOTE — Progress Notes (Signed)
Called to room to assist during endoscopic procedure.  Patient ID and intended procedure confirmed with present staff. Received instructions for my participation in the procedure from the performing physician.  

## 2012-07-28 NOTE — Progress Notes (Signed)
Cannot schedule EGD, schedule is not out that far yet, advised pt she will have to call to schedule when it is closer to time-am

## 2012-07-28 NOTE — Op Note (Signed)
[facility] [address] [place], [address]   ENDOSCOPY PROCEDURE REPORT  PATIENT: Erica, [name]  MR#: [id number] BIRTHDATE: May 16, 1975 , 36  yrs. old GENDER: Female ENDOSCOPIST: Beverley Fiedler, MD PROCEDURE DATE:  07/28/2012 PROCEDURE:  EGD w/ biopsy ASA CLASS:     Class III INDICATIONS:  abdominal pain in upper left quadrant. MEDICATIONS: MAC sedation, administered by CRNA and propofol (Diprivan) 400mg  IV TOPICAL ANESTHETIC: none  DESCRIPTION OF PROCEDURE: After the risks benefits and alternatives of the procedure were thoroughly explained, informed consent was obtained.  The LB GIF-H180 [id number] endoscope was introduced through the mouth and advanced to the second portion of the duodenum. Without limitations.  The instrument was slowly withdrawn as the mucosa was fully examined.     ESOPHAGUS: Small white plaques mucosa was found in the middle third of the esophagus.  Multiple biopsies were performed using cold forceps to exclude Candida.   A normal Z-line was observed 40 cm from the incisors.  STOMACH: Two small non-bleeding shallow and clean-based ulcers were found on the greater curvature of the gastric body.  Biopsies were taken at edge of the ulcers and around the ulcers.   Moderate erosive and hemorrhagic gastritis (inflammation) was found in the gastric body and gastric antrum.  There was adherent blood present. Multiple biopsies were performed using cold forceps.  DUODENUM: The duodenal mucosa showed no abnormalities in the bulb and second portion of the duodenum.  Retroflexed views revealed no abnormalities.     The scope was then withdrawn from the patient and the procedure completed.  COMPLICATIONS: There were no complications.  ENDOSCOPIC IMPRESSION: 1.   Possible candida esophagitis, mild, was found in the middle third of the esophagus; multiple biopsies 2.   Normal Z-line was observed 40 cm from the incisors 3.   Two small  non-bleeding ulcers were found on the greater curvature of the gastric body 4.   Erosive and hemorrhagic gastritis (inflammation) was found in the gastric body and gastric antrum; multiple biopsies 5.   The duodenal mucosa showed no abnormalities in the bulb and second portion of the duodenum     RECOMMENDATIONS: 1.  Await pathology results 2.  Begin pantoprazole 40 mg twice daily.  This medication should be taken 30 minutes to one hour before your first and last meal of the day. 3.  Can continue sucralfate 1 g four times daily if helpful 4.  Follow-up of helicobacter pylori status, treat if indicated 5.  Avoid NSAIDs 6.  Repeat upper endoscopy in 12 weeks to ensure healing  eSigned:  Beverley Fiedler, MD 07/28/2012 3:29 PM   CC:The Patient, [name], MD, and Ruthann Cancer, MD  PATIENT NAME:  Erica, [name] MR#: [id number]

## 2012-07-28 NOTE — Progress Notes (Signed)
Patient did not experience any of the following events: a burn prior to discharge; a fall within the facility; wrong site/side/patient/procedure/implant event; or a hospital transfer or hospital admission upon discharge from the facility. (G8907) Patient did not have preoperative order for IV antibiotic SSI prophylaxis. (G8918)  

## 2012-07-28 NOTE — Patient Instructions (Addendum)

## 2012-07-28 NOTE — Progress Notes (Signed)
BP elevation in admitting discussed with S. Camp CRNA who ok'd continuing with procedure today.  BP medicine was taken this am by pt.

## 2012-07-29 ENCOUNTER — Telehealth: Payer: Self-pay | Admitting: *Deleted

## 2012-07-29 NOTE — Telephone Encounter (Signed)
  Follow up Call-  Call back number 07/28/2012  Post procedure Call Back phone  # [phone]  Permission to leave phone message Yes     Patient questions:  The message box is full.

## 2012-08-04 ENCOUNTER — Encounter: Payer: Self-pay | Admitting: Internal Medicine

## 2012-08-05 ENCOUNTER — Other Ambulatory Visit (INDEPENDENT_AMBULATORY_CARE_PROVIDER_SITE_OTHER): Payer: Self-pay

## 2012-08-05 ENCOUNTER — Encounter: Payer: Self-pay | Admitting: Internal Medicine

## 2012-08-05 ENCOUNTER — Ambulatory Visit (INDEPENDENT_AMBULATORY_CARE_PROVIDER_SITE_OTHER): Payer: Self-pay | Admitting: Internal Medicine

## 2012-08-05 VITALS — BP 146/98 | HR 80 | Temp 98.6°F | Resp 16 | Wt 206.8 lb

## 2012-08-05 DIAGNOSIS — I10 Essential (primary) hypertension: Secondary | ICD-10-CM

## 2012-08-05 DIAGNOSIS — E876 Hypokalemia: Secondary | ICD-10-CM

## 2012-08-05 LAB — URINALYSIS, ROUTINE W REFLEX MICROSCOPIC
Bilirubin Urine: NEGATIVE
Nitrite: NEGATIVE
Specific Gravity, Urine: <=1.005 (ref 1.000–1.030)
Total Protein, Urine: NEGATIVE
pH: 6 (ref 5.0–8.0)

## 2012-08-05 LAB — COMPREHENSIVE METABOLIC PANEL
Alkaline Phosphatase: 50 U/L (ref 39–117)
BUN: 9 mg/dL (ref 6–23)
CO2: 24 mEq/L (ref 19–32)
Creatinine, Ser: 0.9 mg/dL (ref 0.4–1.2)
GFR: 87.45 mL/min (ref >60.00)
Glucose, Bld: 100 mg/dL — ABNORMAL HIGH (ref 70–99)
Sodium: 135 mEq/L (ref 135–145)
Total Bilirubin: 0.7 mg/dL (ref 0.3–1.2)

## 2012-08-05 MED ORDER — AMLODIPINE-OLMESARTAN 5-40 MG PO TABS: 1.0000 | ORAL_TABLET | Freq: Every day | ORAL | Status: DC

## 2012-08-05 MED ORDER — LABETALOL HCL 200 MG PO TABS: 400.0000 mg | ORAL_TABLET | Freq: Two times a day (BID) | ORAL | Status: DC

## 2012-08-05 MED ORDER — CLONIDINE HCL 0.2 MG PO TABS: 0.2000 mg | ORAL_TABLET | Freq: Two times a day (BID) | ORAL | Status: DC

## 2012-08-05 MED ORDER — TRIAMTERENE-HCTZ 37.5-25 MG PO TABS: 1.0000 | ORAL_TABLET | Freq: Every day | ORAL | Status: DC

## 2012-08-05 NOTE — Patient Instructions (Signed)

## 2012-08-05 NOTE — Assessment & Plan Note (Signed)
I will recheck her K+ level and will check her Mg++ level as well 

## 2012-08-05 NOTE — Progress Notes (Signed)
  Subjective:    Patient ID: Erica [name], female    DOB: 1975/06/17, 37 y.o.   MRN: [id number]  Hypertension This is a chronic problem. The current episode started more than 1 year ago. The problem has been gradually worsening since onset. The problem is uncontrolled. Pertinent negatives include no anxiety, blurred vision, chest pain, headaches, malaise/fatigue, neck pain, orthopnea, palpitations, peripheral edema, PND, shortness of breath or sweats. Past treatments include beta blockers, calcium channel blockers, central alpha agonists, angiotensin blockers and diuretics. The current treatment provides mild improvement. Compliance problems include exercise and diet.       Review of Systems  Constitutional: Negative.  Negative for malaise/fatigue.  HENT: Negative.  Negative for neck pain.   Eyes: Negative.  Negative for blurred vision.  Respiratory: Negative.  Negative for apnea, cough, choking, chest tightness, shortness of breath and stridor.   Cardiovascular: Negative.  Negative for chest pain, palpitations, orthopnea, leg swelling and PND.  Gastrointestinal: Negative.  Negative for nausea, vomiting, abdominal pain, diarrhea and constipation.  Endocrine: Negative.   Genitourinary: Negative.  Negative for dysuria, frequency and flank pain.  Musculoskeletal: Negative.   Skin: Negative.   Allergic/Immunologic: Negative.   Neurological: Negative for dizziness, weakness, light-headedness and headaches.  Hematological: Negative.  Negative for adenopathy. Does not bruise/bleed easily.  Psychiatric/Behavioral: Negative.        Objective:   Physical Exam  Vitals reviewed. Constitutional: She is oriented to person, place, and time. She appears well-developed and well-nourished. No distress.  HENT:  Head: Normocephalic and atraumatic.  Mouth/Throat: Oropharynx is clear and moist. No oropharyngeal exudate.  Eyes: Conjunctivae are normal. Right eye exhibits no discharge. Left eye exhibits  no discharge. No scleral icterus.  Neck: Normal range of motion. Neck supple. No JVD present. No tracheal deviation present. No thyromegaly present.  Cardiovascular: Normal rate, regular rhythm, normal heart sounds and intact distal pulses.  Exam reveals no gallop and no friction rub.   No murmur heard. Pulmonary/Chest: Effort normal and breath sounds normal. No stridor. No respiratory distress. She has no wheezes. She has no rales. She exhibits no tenderness.  Abdominal: Soft. Bowel sounds are normal. She exhibits no distension. There is no tenderness. There is no rebound and no guarding.  Musculoskeletal: Normal range of motion. She exhibits no edema and no tenderness.  Lymphadenopathy:    She has no cervical adenopathy.  Neurological: She is oriented to person, place, and time.  Skin: Skin is warm and dry. No rash noted. She is not diaphoretic. No erythema. No pallor.  Psychiatric: She has a normal mood and affect. Her behavior is normal. Judgment and thought content normal.     Lab Results  Component Value Date   WBC 5.3 06/03/2012   HGB 14.3 06/03/2012   HCT 41.2 06/03/2012   PLT 186.0 06/03/2012   GLUCOSE 99 06/03/2012   CHOL 176 03/02/2012   TRIG 154.0* 03/02/2012   HDL 47.70 03/02/2012   LDLCALC 98 03/02/2012   ALT 41 05/01/2012   AST 29 05/01/2012   NA 139 06/03/2012   K 3.1* 06/03/2012   CL 104 06/03/2012   CREATININE 1.1 06/03/2012   BUN 8 06/03/2012   CO2 26 06/03/2012   TSH 1.91 03/02/2012   INR 1.1* 06/03/2012   HGBA1C 5.2 03/02/2012       Assessment & Plan:

## 2012-08-05 NOTE — Assessment & Plan Note (Signed)
Her BP is not well controlled On a recent CT scan her kidneys appeared normal so I do not think she has RAS or structural kidney disease I will recheck her lytes and renal function today I have raised the dose on her clonidine and have changed the HCTZ to Maxzide She will continue on labetolol and azor as well

## 2012-08-06 ENCOUNTER — Other Ambulatory Visit: Payer: Self-pay

## 2012-08-06 DIAGNOSIS — I10 Essential (primary) hypertension: Secondary | ICD-10-CM

## 2012-08-06 MED ORDER — LABETALOL HCL 200 MG PO TABS: 400.0000 mg | ORAL_TABLET | Freq: Two times a day (BID) | ORAL | Status: DC

## 2012-09-02 ENCOUNTER — Other Ambulatory Visit: Payer: Self-pay | Admitting: Physician Assistant

## 2012-09-02 DIAGNOSIS — C50919 Malignant neoplasm of unspecified site of unspecified female breast: Secondary | ICD-10-CM

## 2012-09-03 ENCOUNTER — Other Ambulatory Visit: Payer: Self-pay | Admitting: Lab

## 2012-09-10 ENCOUNTER — Ambulatory Visit (HOSPITAL_BASED_OUTPATIENT_CLINIC_OR_DEPARTMENT_OTHER): Payer: Self-pay | Admitting: Physician Assistant

## 2012-09-10 ENCOUNTER — Telehealth: Payer: Self-pay | Admitting: *Deleted

## 2012-09-10 ENCOUNTER — Ambulatory Visit (HOSPITAL_BASED_OUTPATIENT_CLINIC_OR_DEPARTMENT_OTHER): Payer: Self-pay | Admitting: Lab

## 2012-09-10 ENCOUNTER — Encounter: Payer: Self-pay | Admitting: Physician Assistant

## 2012-09-10 ENCOUNTER — Ambulatory Visit: Payer: Self-pay | Admitting: Oncology

## 2012-09-10 VITALS — BP 166/97 | HR 112 | Temp 99.0°F | Resp 20 | Ht 67.0 in | Wt 209.0 lb

## 2012-09-10 DIAGNOSIS — G620 Drug-induced polyneuropathy: Secondary | ICD-10-CM

## 2012-09-10 DIAGNOSIS — G8928 Other chronic postprocedural pain: Secondary | ICD-10-CM

## 2012-09-10 DIAGNOSIS — C50912 Malignant neoplasm of unspecified site of left female breast: Secondary | ICD-10-CM

## 2012-09-10 DIAGNOSIS — Z171 Estrogen receptor negative status [ER-]: Secondary | ICD-10-CM

## 2012-09-10 DIAGNOSIS — C50419 Malignant neoplasm of upper-outer quadrant of unspecified female breast: Secondary | ICD-10-CM

## 2012-09-10 DIAGNOSIS — I10 Essential (primary) hypertension: Secondary | ICD-10-CM

## 2012-09-10 DIAGNOSIS — Z853 Personal history of malignant neoplasm of breast: Secondary | ICD-10-CM

## 2012-09-10 DIAGNOSIS — J4541 Moderate persistent asthma with (acute) exacerbation: Secondary | ICD-10-CM

## 2012-09-10 LAB — CBC WITH DIFFERENTIAL/PLATELET
BASO%: 0.7 % (ref 0.0–2.0)
Basophils Absolute: 0.1 10e3/uL (ref 0.0–0.1)
EOS%: 1.1 % (ref 0.0–7.0)
Eosinophils Absolute: 0.1 10e3/uL (ref 0.0–0.5)
HCT: 40.9 % (ref 34.8–46.6)
HGB: 14.1 g/dL (ref 11.6–15.9)
LYMPH%: 26.8 % (ref 14.0–49.7)
MCH: 31.2 pg (ref 25.1–34.0)
MCHC: 34.5 g/dL (ref 31.5–36.0)
MCV: 90.4 fL (ref 79.5–101.0)
MONO#: 0.6 10e3/uL (ref 0.1–0.9)
MONO%: 8.3 % (ref 0.0–14.0)
NEUT#: 4.4 10e3/uL (ref 1.5–6.5)
NEUT%: 63.1 % (ref 38.4–76.8)
Platelets: 214 10e3/uL (ref 145–400)
RBC: 4.52 10e6/uL (ref 3.70–5.45)
RDW: 14 % (ref 11.2–14.5)
WBC: 7 10e3/uL (ref 3.9–10.3)
lymph#: 1.9 10e3/uL (ref 0.9–3.3)

## 2012-09-10 LAB — COMPREHENSIVE METABOLIC PANEL (CC13)
ALT: 28 U/L (ref 0–55)
CO2: 27 mEq/L (ref 22–29)
Calcium: 9.3 mg/dL (ref 8.4–10.4)
Chloride: 103 mEq/L (ref 98–107)
Creatinine: 1.3 mg/dL — ABNORMAL HIGH (ref 0.6–1.1)
Glucose: 86 mg/dl (ref 70–99)
Total Bilirubin: 0.32 mg/dL (ref 0.20–1.20)
Total Protein: 7.6 g/dL (ref 6.4–8.3)

## 2012-09-10 MED ORDER — TRAMADOL HCL 50 MG PO TABS: 50.0000 mg | ORAL_TABLET | Freq: Four times a day (QID) | ORAL | Status: DC | PRN

## 2012-09-10 NOTE — Telephone Encounter (Signed)
appts made printed. Pt is aware to attend DR. WERT office on 09/11/12 @11am ....td

## 2012-09-10 NOTE — Progress Notes (Signed)
ID: Erica [name]   DOB: [birth year]  MR#: [id number]  WUJ#:[id number]  NFA:[id number] [name], MD GYN: SU:  Harriette Bouillon, MD Other:  Sandrea Hughs, MD;  Erick Blinks, MD  HISTORY OF PRESENT ILLNESS: Erica [name] noticed a lump in her left breast sometime in August or September 2009.  Initially this was thought to possibly be "dry breast milk" but as the mass got bigger, she brought it to her physician's attention and was set up for mammography.  This was performed May 20, 2008 at [facility] and it showed a palpable 8.5 cm oval circumscribed mass in the left breast upper outer quadrant.  There were prominent left axillary lymph nodes noted as well.  An ultrasound showed this to be hypoechoic, with lobulated margins and probable central necrosis measuring up to 7.3 cm on this modality.  Ultrasound of the left axilla demonstrated numerous prominent lymph nodes at the upper limit of normal, the largest measuring 1 cm with a normal appearing thin cortex and a normal cortex to halo ratio.    Because the patient had no insurance, she was referred to [facility] and biopsy was performed in [place] with the pathology there ([id number]) showing a poorly differentiated malignancy which was triple negative (ER 1%, PR 0%, HER-2/neu 0).  The axillary lymph nodes were not biopsied. With this information, the patient was presented at the Breast Multidisciplinary Conference and the feeling was that, after appropriate staging, the patient would be a good candidate for neoadjuvant treatment and possibly NSABP B40.  Her subsequent history is as detailed below  INTERVAL HISTORY: Erica [name] returns today for followup of her left breast cancer. She iscurrently living in [place], [place], with her 4 children and her mom.  She continues to travel to [facility] for her doctor's appointments and would like to continue her healthcare here.   Interval history is remarkable for continued problems with poorly controlled hypertension and  asthma. She is followed by her primary care physician, Dr. [name], and her pulmonologist, Dr. Sherene Sires. She has occasional headaches and dizziness which she attributes to the hypertension. On a couple of occasions she has fallen, but she denies any injuries.  She denies any chest pain or palpitations.  With regards to her breast cancer, Erica [name] is doing generally well. 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  She's tried tramadol in the past which she finds somewhat helpful, although it does not "take the pain away".   REVIEW OF SYSTEMS: Erica [name] has had no fevers or chills. She has hot flashes. She's had no skin changes or rashes. She bruises easily. She occasionally has nosebleeds. Her last menstrual cycle was one month ago. She continues to have fatigue.  She continues to have a chronic cough with shortness of breath associated with her asthma.. She denies any chest pain or palpitations.  She has decreased vision in the right eye, but this has not worsened or changed. She was very tearful on exam today, and admitted to some depression and anxiety. She tells me she has a good support system in [place]. She denies any suicidal ideations.   A detailed review of systems is otherwise stable and noncontributory.   PAST MEDICAL HISTORY: Past Medical History  Diagnosis Date  . Breast cancer 2010  . Asthma   . Hypertension   . GERD (gastroesophageal reflux disease)   .  Impaired glucose tolerance 04/13/2011  . Anemia, unspecified 04/13/2011  . Asthma 09/27/2010  . Bloating 03/18/2011  . 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 Lymphadenitis, chronic     restricted LEFT extremity    PAST SURGICAL HISTORY: Past Surgical History  Procedure Laterality Date  . Hand surgery  2012    right; tendon release  . Breast lumpectomy  09/2008     left  . Carpal tunnel release  2011    left hand  . Tubal ligation  05/11/2011    Procedure: POST PARTUM TUBAL LIGATION;  Surgeon: Scheryl Darter, MD;  Location: [facility];  Service: Gynecology;  Laterality: Bilateral;  Induced for HTN  . Lymph node dissection  2010    left breast; 2 wks after breast lumpectomy    FAMILY HISTORY Family History  Problem Relation Age of Onset  . Diabetes Maternal Grandmother   . Heart disease Maternal Grandmother   . Cancer Neg Hx   . Alcohol abuse Neg Hx   . Early death Neg Hx   . Hyperlipidemia Neg Hx   . Hypertension Neg Hx   . Kidney disease Neg Hx   . Stroke Neg Hx   . Colon cancer Neg Hx   The patient's parents are alive. The patient has one brother and one sister.  There is no ovarian or breast cancer in the immediate family, but the patient's maternal grandmother, who was one of 10 sisters, had one sister with breast cancer diagnosed in her sixties.    GYNECOLOGIC HISTORY: updated OCT 2013 The patient is GX P4, first pregnancy to term at age 10.  She stopped having periods at the time of chemotherapy, and had had no periods for the last 2 years, then became pregnant. This was a surprise to her, and she was not aware of the pregnancy until the seventh month. She has not resumed normal menses, but has had some "spotting" on and off for the last 3 months.  SOCIAL HISTORY: updated OCT 2013 She has been a Social research officer, government in the past. She's currently disabled. Raelie has four children,  currently 65 year old, 63 years old, 41 and 39 years old. They're all with her in [place]. 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 Smoking status: Never Smoker   . Smokeless tobacco: Never Used  . Alcohol Use: No     Comment: occasionally     Colonoscopy:  PAP:  Bone density:  Lipid panel:  Allergies  Allergen Reactions  . Aspirin Shortness Of Breath and Palpitations    Pt can take  ibuprofen without reaction  . Penicillins Shortness Of Breath and Palpitations    Current Outpatient Prescriptions  Medication Sig Dispense Refill  . acetaminophen (TYLENOL) 500 MG tablet Take 500-1,000 mg by mouth every 6 (six) hours as needed for pain.      Marland Kitchen albuterol (PROVENTIL HFA;VENTOLIN HFA) 108 (90 BASE) MCG/ACT inhaler Inhale 2 puffs into the lungs every 6 (six) hours as needed for wheezing or shortness of breath.      Marland Kitchen albuterol (PROVENTIL) (2.5 MG/3ML) 0.083% nebulizer solution Take 2.5 mg by nebulization every 6 (six) hours as needed for shortness of breath. For shortness of breath.      Marland Kitchen amLODipine-olmesartan (AZOR) 5-40 MG per tablet Take 1 tablet by mouth daily.  90 tablet  1  . cloNIDine (CATAPRES) 0.2 MG tablet Take 1 tablet (0.2 mg total) by mouth  2 (two) times daily.  180 tablet  1  . Fluticasone-Salmeterol (ADVAIR) 250-50 MCG/DOSE AEPB Inhale 1 puff into the lungs every 12 (twelve) hours.  60 each  1  . hyoscyamine (LEVSIN/SL) 0.125 MG SL tablet Place 1 tablet (0.125 mg total) under the tongue every 4 (four) hours as needed for cramping.  30 tablet  0  . ipratropium (ATROVENT) 0.02 % nebulizer solution Take 500 mcg by nebulization 2 (two) times daily as needed for wheezing. For shortness of breath      . labetalol (NORMODYNE) 200 MG tablet Take 2 tablets (400 mg total) by mouth 2 (two) times daily.  120 tablet  5  . LORazepam (ATIVAN) 0.5 MG tablet Take 0.5 mg by mouth 2 (two) times daily as needed for anxiety.      . mometasone-formoterol (DULERA) 100-5 MCG/ACT AERO Inhale 2 puffs into the lungs 2 (two) times daily.  13 g  5  . montelukast (SINGULAIR) 10 MG tablet Take 1 tablet (10 mg total) by mouth at bedtime.  30 tablet  12  . pantoprazole (PROTONIX) 40 MG tablet Take 1 tablet (40 mg total) by mouth 2 (two) times daily before a meal.  60 tablet  3  . potassium chloride SA (K-DUR,KLOR-CON) 20 MEQ tablet Take 1 tablet (20 mEq total) by mouth 3 (three) times daily.  90  tablet  3  . sucralfate (CARAFATE) 1 G tablet Take 1 tablet (1 g total) by mouth 4 (four) times daily.  30 tablet  0  . traMADol (ULTRAM) 50 MG tablet Take 1 tablet (50 mg total) by mouth every 6 (six) hours as needed for pain.  60 tablet  0  . triamterene-hydrochlorothiazide (MAXZIDE-25) 37.5-25 MG per tablet Take 1 tablet by mouth daily.  90 tablet  1   No current facility-administered medications for this visit.    OBJECTIVE:  Filed Vitals:   09/10/12 1506  BP: 166/97  Pulse: 112  Temp: 99 F (37.2 C)  Resp: 20     Body mass index is 32.73 kg/(m^2).    ECOG FS: 2 Filed Weights   09/10/12 1506  Weight: 209 lb (94.802 kg)  Pulse Ox:  97% at rest;  96% with ambulation  Young African American female who appears anxious and uncomfortable   Sclerae unicteric Oropharynx clear No cervical or supraclavicular adenopathy Lungs diminished breath sounds bilaterally with poor excursion and diffuse expiratory wheezes, greater on the left than the right Heart regular rhythm, tachycardic Abd soft, obese, nontender to palpation, positive bowel sounds  MSK no focal spinal tenderness to palpation. There is tenderness to palpation just beneath the left breast, localized.  No peripheral edema Neuro: nonfocal, well oriented, anxious affect Breasts: The right breast is unremarkable. The left breast is status post lumpectomy and radiation. There is some tenderness to palpation, but  no evidence of local recurrence. Axillae are benign bilaterally with no palpable adenopathy.   LAB RESULTS: Lab Results  Component Value Date   WBC 5.3 06/03/2012   NEUTROABS 3.3 06/03/2012   HGB 14.3 06/03/2012   HCT 41.2 06/03/2012   MCV 91.2 06/03/2012   PLT 186.0 06/03/2012      Chemistry      Component Value Date/Time   NA 135 08/05/2012 1344   NA 138 05/01/2012 1553   K 3.8 08/05/2012 1344   K 3.6 05/01/2012 1553   CL 104 08/05/2012 1344   CL 103 05/01/2012 1553   CO2 24 08/05/2012 1344   CO2 25 05/01/2012  1553   BUN 9  08/05/2012 1344   BUN 10.1 05/01/2012 1553   CREATININE 0.9 08/05/2012 1344   CREATININE 0.9 05/01/2012 1553   CREATININE 0.64 05/02/2011 1021      Component Value Date/Time   CALCIUM 8.7 08/05/2012 1344   CALCIUM 9.2 05/01/2012 1553   ALKPHOS 50 08/05/2012 1344   ALKPHOS 62 05/01/2012 1553   AST 21 08/05/2012 1344   AST 29 05/01/2012 1553   ALT 22 08/05/2012 1344   ALT 41 05/01/2012 1553   BILITOT 0.7 08/05/2012 1344   BILITOT 0.68 05/01/2012 1553       Lab Results  Component Value Date   LABCA2 11 05/01/2012     STUDIES:   01/03/2012 *RADIOLOGY REPORT*  Clinical Data: Patient presents for a bilateral diagnostic  mammogram due to a history of prior left malignant lumpectomy 2010.  DIGITAL DIAGNOSTIC BILATERAL MAMMOGRAM WITH CAD  Comparison: 08/22/2010, 05/22/2009, 10/27/2008 and 05/20/2008  Findings: Examination demonstrates scattered fibroglandular  densities. There are stable post lumpectomy changes of the upper  outer left breast. Remainder of the exam is unchanged.  Mammographic images were processed with CAD.  IMPRESSION:  Stable post lumpectomy changes of the upper outer left breast.  RECOMMENDATION:  Recommend continued annual bilateral diagnostic mammographic  evaluation.  BI-RADS CATEGORY 2: Benign finding(s).  .  Original Report Authenticated By: Elba Barman, M.D.      ASSESSMENT: 37 y.o. [place] woman with history of locally advanced left breast carcinoma initially presenting in March 2010   (1) treated neoadjuvantly with 4 cycles of docetaxel/ cyclophosphamide.    (2) status post left lumpectomy and sentinel lymph node dissection in July 2010 for a ypT2 ypN1, stage IIB invasive ductal carcinoma, grade 3, triple negative with MIB-1 of 88%.   (3) status post full axillary dissection and margin clearance in August 2010.   (4) status post 11 doses of weekly paclitaxel completed in December 2010   (5) radiation therapy completed in March 2011.   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 With regards to her postsurgical pain, and her neuropathic pain likely associated with previous chemotherapy, I am prescribing tramadol which she has found helpful in the past. (50 mg by mouth q. 6 hours as needed for pain, dispensed #60 with no refills)    I am still very concerned about her continued hypertension and poorly controlled asthma. I have recommended that she continue close followup with Dr. [name], and in fact she scheduled to see him again in early August to assess her recent medication changes. She continues to have dizziness, she will contact him sooner. I've also requested an appointment with Dr. Sherene Sires with regards to her asthma, from which she continues to have frequent exacerbations.   Andreanna  is due for her next a lateral mammogram in early October. She will return to see [place] in 4 months for routine labs and physical exam, but knows to call prior that time with any changes or problems.   Vencil Basnett    09/10/2012

## 2012-09-11 ENCOUNTER — Encounter: Payer: Self-pay | Admitting: Internal Medicine

## 2012-09-11 ENCOUNTER — Ambulatory Visit (INDEPENDENT_AMBULATORY_CARE_PROVIDER_SITE_OTHER): Payer: Self-pay | Admitting: Internal Medicine

## 2012-09-11 VITALS — BP 182/102 | HR 106 | Temp 98.5°F | Ht 64.25 in | Wt 209.2 lb

## 2012-09-11 DIAGNOSIS — I10 Essential (primary) hypertension: Secondary | ICD-10-CM

## 2012-09-11 DIAGNOSIS — J45909 Unspecified asthma, uncomplicated: Secondary | ICD-10-CM

## 2012-09-11 MED ORDER — NEBIVOLOL HCL 10 MG PO TABS: ORAL_TABLET | ORAL | Status: DC

## 2012-09-11 NOTE — Patient Instructions (Addendum)
Prednisone 10 mg take  4 each am x 2 days,   2 each am x 2 days,  1 each am x 2 days and stop   Stop advair and normodyne  bystolic 20 mg every 12 hours (2 x 10 every 12 hours)   dulera 100 Take 2 puffs first thing in am and then another 2 puffs about 12 hours later.    work on smooth deep breath  Only use your albuterol (ventolin 1st, nebulizer 2nd)  as a rescue medication to be used if you can't catch your breath by resting or doing a relaxed purse lip breathing pattern. The less you use it, the better it will work when you need it.   See Tammy NP w/in 1 week  with all your medications, even over the counter meds, separated in two separate bags, the ones you take no matter what vs the ones you stop once you feel better and take only as needed when you feel you need them.   Tammy  will generate for you a new user friendly medication calendar that will put [place] all on the same page re: your medication use.     Without this process, it simply isn't possible to assure that we are providing  your outpatient care  with  the attention to detail we feel you deserve.   If we cannot assure that you're getting that kind of care,  then we cannot manage your problem effectively from this clinic.  Once you have seen Tammy and we are sure that we're all on the same page with your medication use she will arrange follow up with me.

## 2012-09-11 NOTE — Progress Notes (Signed)
Subjective:    Patient ID: Erica [name], female    DOB: 1975-11-17, 37 y.o.   MRN: [id number]    Brief patient profile:  71 yobf with asthma all her life much worse since around 07/2010 referred by Dr Darnelle Catalan to pulmonary clinic in Sept 2012  12/18/2010 Initial pulmonary office eval on acei and   advair cc dtc asthma x 4 months but baseline = excess saba use "even when better" at least twice daily despite daily advair, worse at hs in fact  used saba 3 x last night.  On chemo /RT for breast ca but last rx was Jan 2012 well before asthma worse.  To er multiple times , no better with singular added x 3 months. rec Stop Lisinopril Dulera 200 Take 2 puffs first thing in am and then another 2 puffs about 12 hours later.  Work on inhaler technique  Nexium 40 mg   Take 30-60 min before first meal of the day and Pepcid 20 mg one bedtime until return Only use your albuterol as a rescue medication to be used if you can't catch your breath by resting or doing a relaxed purse lip breathing pattern. The less you use it, the better it will work when you need it.  Prednisone 10 mg take  4 each am x 2 days,   2 each am x 2 days,  1 each am x2days and stop    01/03/2011 f/u ov/[name] cc much better off ace and advair but still needing saba twice daily on avg. No  Purulent sputum. rec no change in rx/ rule of 2's reviewed     01/31/2011 f/u ov/[name] cc no change sensation of sob needs albuterol w/in 1 hour of am use of dulera and also around 1am p going to bed around 10 still not back to where she was before May flare - no purulent sputum rec Increase the benicar to 40/25 one daily. Work on inhaler technique:   LATE ADD Pepcid 20 mg one at bedtime   09/11/2012 f/u ov/[name] "did fine" p prev ov back again on advair 250  Chief Complaint  Patient presents with  . Acute Visit    Pt c/o increased cough and SOBfor the past 2 wks. Cough is occ prod with minimal clear sputum. Breathing is esp worse at night,  waking up 2-3 times at night to use neb.     No obvious daytime variabilty or assoc   cp or chest tightness, subjective wheeze overt sinus or hb symptoms. No unusual exp hx or h/o childhood pna/ asthma or knowledge of premature birth.   Also denies any obvious fluctuation of symptoms with weather or environmental changes or other aggravating or alleviating factors except as outlined above     Current Medications, Allergies, Past Medical History, Past Surgical History, Family History, and Social History were reviewed in [employer] record.  ROS  The following are not active complaints unless bolded sore throat, dysphagia, dental problems, itching, sneezing,  nasal congestion or excess/ purulent secretions, ear ache,   fever, chills, sweats, unintended wt loss, pleuritic or exertional cp, hemoptysis,  orthopnea pnd or leg swelling, presyncope, palpitations, heartburn, abdominal pain, anorexia, nausea, vomiting, diarrhea  or change in bowel or urinary habits, change in stools or urine, dysuria,hematuria,  rash, arthralgias, visual complaints, headache, numbness weakness or ataxia or problems with walking or coordination,  change in mood/affect or memory.  Objective:   Physical Exam  Obese bf nad with classic voice fatigue and prominent pseudowheeze  Wt 213  12/18/10 > 01/03/2011  214 > 01/31/2011  206 >  09/11/2012 209   HEENT: nl dentition, turbinates, and orophanx. Nl external ear canals without cough reflex   NECK :  without JVD/Nodes/TM/ nl carotid upstrokes bilaterally   LUNGS: no acc muscle use, clear to A and P bilaterally without cough on insp or exp maneuvers   CV:  RRR  no s3 or murmur or increase in P2, no edema   ABD:  soft and nontender with nl excursion in the supine position. No bruits or organomegaly, bowel sounds nl  MS:  warm without deformities, calf tenderness, cyanosis or clubbing              Assessment & Plan:

## 2012-09-13 NOTE — Assessment & Plan Note (Signed)
BP is poorly controlled and she is not adequately beta blocked on normodyne in high doses with major spillover B2 potentially >>Strongly prefer in this setting: Bystolic, the most beta -1  selective Beta blocker available in sample form, with bisoprolol the most selective generic choice  on the market.   Try 20 mg bid bystolic x one week and then regroup

## 2012-09-13 NOTE — Assessment & Plan Note (Addendum)
-   HFA 75% 01/31/2011  >  90% 09/11/2012     - PFT's 01/31/2011 FEV1  1.76 (60% ) ratio 68 and no better p B2,  DLCO 70%  DDX of  difficult airways managment all start with A and  include Adherence, Ace Inhibitors, Acid Reflux, Active Sinus Disease, Alpha 1 Antitripsin deficiency, Anxiety masquerading as Airways dz,  ABPA,  allergy(esp in young), Aspiration (esp in elderly), Adverse effects of DPI,  Active smokers, plus two Bs  = Bronchiectasis and Beta blocker use..and one C= CHF   Adherence is always the initial "prime suspect" and is a multilayered concern that requires a "trust but verify" approach in every patient - starting with knowing how to use medications, especially inhalers, correctly, keeping up with refills and understanding the fundamental difference between maintenance and prns vs those medications only taken for a very short course and then stopped and not refilled. The proper method of use, as well as anticipated side effects, of a metered-dose inhaler are discussed and demonstrated to the patient. Improved effectiveness after extensive coaching during this visit to a level of approximately  90% so needs to change back to ics/laba not so closely assoc with pseudoasthma > dulera 100 2bid  ? Acid reflux > max rx/ diet  ? Adverse effect of advair > d/c   ? Beta blocker (see HBP ) > d/c normodyned

## 2012-09-17 ENCOUNTER — Other Ambulatory Visit (INDEPENDENT_AMBULATORY_CARE_PROVIDER_SITE_OTHER): Payer: Self-pay

## 2012-09-17 ENCOUNTER — Ambulatory Visit (INDEPENDENT_AMBULATORY_CARE_PROVIDER_SITE_OTHER)
Admission: RE | Admit: 2012-09-17 | Discharge: 2012-09-17 | Disposition: A | Discharge status: Home / Self Care | Payer: Self-pay | Source: Ambulatory Visit | Attending: Adult Health | Admitting: Adult Health

## 2012-09-17 ENCOUNTER — Ambulatory Visit (INDEPENDENT_AMBULATORY_CARE_PROVIDER_SITE_OTHER): Payer: Self-pay | Admitting: Adult Health

## 2012-09-17 ENCOUNTER — Encounter: Payer: Self-pay | Admitting: Adult Health

## 2012-09-17 VITALS — BP 160/80 | HR 106 | Temp 99.2°F | Ht 64.0 in | Wt 208.8 lb

## 2012-09-17 DIAGNOSIS — R059 Cough, unspecified: Secondary | ICD-10-CM

## 2012-09-17 DIAGNOSIS — R0609 Other forms of dyspnea: Secondary | ICD-10-CM

## 2012-09-17 DIAGNOSIS — R0600 Dyspnea, unspecified: Secondary | ICD-10-CM

## 2012-09-17 DIAGNOSIS — R05 Cough: Secondary | ICD-10-CM

## 2012-09-17 DIAGNOSIS — R0989 Other specified symptoms and signs involving the circulatory and respiratory systems: Secondary | ICD-10-CM

## 2012-09-17 LAB — BASIC METABOLIC PANEL
CO2: 24 mEq/L (ref 19–32)
Calcium: 9.6 mg/dL (ref 8.4–10.5)
GFR: 90.81 mL/min (ref >60.00)
Glucose, Bld: 107 mg/dL — ABNORMAL HIGH (ref 70–99)
Potassium: 3.9 mEq/L (ref 3.5–5.1)
Sodium: 137 mEq/L (ref 135–145)

## 2012-09-17 LAB — SEDIMENTATION RATE: Sed Rate: 16 mm/hr (ref 0–22)

## 2012-09-17 NOTE — Patient Instructions (Addendum)
Continue on current regimen.  Labs and xray today.  follow up in 1 week with all your meds for med calendar  Please contact office for sooner follow up if symptoms do not improve or worsen or seek emergency care

## 2012-09-18 LAB — D-DIMER, QUANTITATIVE: D-Dimer, Quant: 0.27 ug/mL-FEU (ref 0.00–0.48)

## 2012-09-22 NOTE — Progress Notes (Signed)
Subjective:    Patient ID: Erica [name], female    DOB: 1976-01-14, 37 y.o.   MRN: [id number]    Brief patient profile:  1 yobf with asthma all her life much worse since around 07/2010 referred by Dr Darnelle Catalan to pulmonary clinic in Sept 2012  12/18/2010 Initial pulmonary office eval on acei and   advair cc dtc asthma x 4 months but baseline = excess saba use "even when better" at least twice daily despite daily advair, worse at hs in fact  used saba 3 x last night.  On chemo /RT for breast ca but last rx was Jan 2012 well before asthma worse.  To er multiple times , no better with singular added x 3 months. rec Stop Lisinopril Dulera 200 Take 2 puffs first thing in am and then another 2 puffs about 12 hours later.  Work on inhaler technique  Nexium 40 mg   Take 30-60 min before first meal of the day and Pepcid 20 mg one bedtime until return Only use your albuterol as a rescue medication to be used if you can't catch your breath by resting or doing a relaxed purse lip breathing pattern. The less you use it, the better it will work when you need it.  Prednisone 10 mg take  4 each am x 2 days,   2 each am x 2 days,  1 each am x2days and stop    01/03/2011 f/u ov/Wert cc much better off ace and advair but still needing saba twice daily on avg. No  Purulent sputum. rec no change in rx/ rule of 2's reviewed     01/31/2011 f/u ov/Wert cc no change sensation of sob needs albuterol w/in 1 hour of am use of dulera and also around 1am p going to bed around 10 still not back to where she was before May flare - no purulent sputum rec Increase the benicar to 40/25 one daily. Work on inhaler technique:   LATE ADD Pepcid 20 mg one at bedtime   09/11/2012 f/u ov/Wert "did fine" p prev ov back again on advair 250  Chief Complaint  Patient presents with  . Acute Visit    Pt c/o increased cough and SOBfor the past 2 wks. Cough is occ prod with minimal clear sputum. Breathing is esp worse at night,  waking up 2-3 times at night to use neb.   >>Prednisone 10 mg take  4 each am x 2 days,   2 each am x 2 days,  1 each am x 2 days and stop   Stop advair and normodyne  bystolic 20 mg every 12 hours (2 x 10 every 12 hours)   dulera 100 Take 2 puffs first thing in am and then another 2 puffs about 12 hours later  09/17/12 Follow up and med review  1 week follow up. Seen last week for asthma /dyspnea.  Tx w/ steroid taper. Changed over to [facility] from Advair. Labetelol changed to bystolic.  Has not seen much change in breathing  Hx of breast cancer. W/ previous chemo and XRT.  Complains of DOE, wheezing and dry cough. Occasional tan mucus. No hemoptysis, chest pain , orthopnea, or calf pain.        Current Medications, Allergies, Past Medical History, Past Surgical History, Family History, and Social History were reviewed in [employer] record.  ROS  The following are not active complaints unless bolded sore throat, dysphagia, dental problems, itching, sneezing,  nasal congestion  or excess/ purulent secretions, ear ache,   fever, chills, sweats, unintended wt loss, pleuritic or exertional cp, hemoptysis,  orthopnea pnd or leg swelling, presyncope, palpitations, heartburn, abdominal pain, anorexia, nausea, vomiting, diarrhea  or change in bowel or urinary habits, change in stools or urine, dysuria,hematuria,  rash, arthralgias, visual complaints, headache, numbness weakness or ataxia or problems with walking or coordination,  change in mood/affect or memory.                  Objective:   Physical Exam  Obese bf nad with classic voice fatigue and prominent pseudowheeze  Wt 213  12/18/10 > 01/03/2011  214 > 01/31/2011  206 >  09/11/2012 209 >208 6/19   HEENT: nl dentition, turbinates, and orophanx. Nl external ear canals without cough reflex   NECK :  without JVD/Nodes/TM/ nl carotid upstrokes bilaterally   LUNGS: no acc muscle use, clear to A and P  bilaterally without cough on insp or exp maneuvers   CV:  RRR  no s3 or murmur or increase in P2, no edema   ABD:  soft and nontender with nl excursion in the supine position. No bruits or organomegaly, bowel sounds nl  MS:  warm without deformities, calf tenderness, cyanosis or clubbing              Assessment & Plan:

## 2012-09-22 NOTE — Assessment & Plan Note (Addendum)
Difficult to control Asthma with worsening symptomology  She has hx of XRT /chemo and previous cancer  Will check xray , labs w/ ddimer , bnp and esr looking for possible causes of dyspnea .   Plan  Continue on current regimen  Check labs and cxr today

## 2012-09-25 ENCOUNTER — Encounter: Payer: Self-pay | Admitting: Adult Health

## 2012-09-25 ENCOUNTER — Ambulatory Visit (INDEPENDENT_AMBULATORY_CARE_PROVIDER_SITE_OTHER): Payer: Self-pay | Admitting: Adult Health

## 2012-09-25 VITALS — BP 144/82 | HR 88 | Temp 98.7°F | Ht 64.0 in | Wt 210.8 lb

## 2012-09-25 DIAGNOSIS — I10 Essential (primary) hypertension: Secondary | ICD-10-CM

## 2012-09-25 MED ORDER — BISOPROLOL FUMARATE 10 MG PO TABS: 10.0000 mg | ORAL_TABLET | Freq: Two times a day (BID) | ORAL | Status: DC

## 2012-09-25 MED ORDER — BISOPROLOL FUMARATE 10 MG PO TABS: 20.0000 mg | ORAL_TABLET | Freq: Two times a day (BID) | ORAL | Status: DC

## 2012-09-25 NOTE — Progress Notes (Signed)
Quick Note:  Pt aware ______ 

## 2012-09-25 NOTE — Assessment & Plan Note (Addendum)
D/c atrovent neb  Cont on dulera  Cont off  nonselective BB-  Cont on GERD control  Dulera paperwork filled out for drug assitance  follow up Dr. Sherene Sires  In 6 weeks

## 2012-09-25 NOTE — Progress Notes (Signed)
Subjective:    Patient ID: Erica [name], female    DOB: 1975/08/06, 37 y.o.   MRN: [id number]    Brief patient profile:  38 yobf with asthma all her life much worse since around 07/2010 referred by Dr Darnelle Catalan to pulmonary clinic in Sept 2012  12/18/2010 Initial pulmonary office eval on acei and   advair cc dtc asthma x 4 months but baseline = excess saba use "even when better" at least twice daily despite daily advair, worse at hs in fact  used saba 3 x last night.  On chemo /RT for breast ca but last rx was Jan 2012 well before asthma worse.  To er multiple times , no better with singular added x 3 months. rec Stop Lisinopril Dulera 200 Take 2 puffs first thing in am and then another 2 puffs about 12 hours later.  Work on inhaler technique  Nexium 40 mg   Take 30-60 min before first meal of the day and Pepcid 20 mg one bedtime until return Only use your albuterol as a rescue medication to be used if you can't catch your breath by resting or doing a relaxed purse lip breathing pattern. The less you use it, the better it will work when you need it.  Prednisone 10 mg take  4 each am x 2 days,   2 each am x 2 days,  1 each am x2days and stop    01/03/2011 f/u ov/Wert cc much better off ace and advair but still needing saba twice daily on avg. No  Purulent sputum. rec no change in rx/ rule of 2's reviewed     01/31/2011 f/u ov/Wert cc no change sensation of sob needs albuterol w/in 1 hour of am use of dulera and also around 1am p going to bed around 10 still not back to where she was before May flare - no purulent sputum rec Increase the benicar to 40/25 one daily. Work on inhaler technique:   LATE ADD Pepcid 20 mg one at bedtime   09/11/2012 f/u ov/Wert "did fine" p prev ov back again on advair 250  Chief Complaint  Patient presents with  . Acute Visit    Pt c/o increased cough and SOBfor the past 2 wks. Cough is occ prod with minimal clear sputum. Breathing is esp worse at night,  waking up 2-3 times at night to use neb.   >>Prednisone 10 mg take  4 each am x 2 days,   2 each am x 2 days,  1 each am x 2 days and stop   Stop advair and normodyne  bystolic 20 mg every 12 hours (2 x 10 every 12 hours)   dulera 100 Take 2 puffs first thing in am and then another 2 puffs about 12 hours later  09/17/12 Follow up and med review  1 week follow up. Seen last week for asthma /dyspnea.  Tx w/ steroid taper. Changed over to [facility] from Advair. Labetelol changed to bystolic.  Has not seen much change in breathing  Hx of breast cancer. W/ previous chemo and XRT.  Complains of DOE, wheezing and dry cough. Occasional tan mucus. No hemoptysis, chest pain , orthopnea, or calf pain.  >>labs /xray   09/25/2012 Med calendar  Returns for 1 week follow up for med review  We reviewed all her meds .Data base down for med calendars.  She depends on samples , rx to cone assistance program and generics -as she does not have insurance.  We discussed drug assistance program for [facility]  Will need to change bystolic to bisoprolol for cost issues.  Last visit labs revealed neg d dimer and bnp. CXR w/out acute process.  She feels about the same still gets DOE . Occasional cough and wheezing on/off.  No fever or discolored mucus.  Lives part-time in [place].       Current Medications, Allergies, Past Medical History, Past Surgical History, Family History, and Social History were reviewed in [employer] record.  ROS  The following are not active complaints unless bolded sore throat, dysphagia, dental problems, itching, sneezing,  nasal congestion or excess/ purulent secretions, ear ache,   fever, chills, sweats, unintended wt loss, pleuritic or exertional cp, hemoptysis,  orthopnea pnd or leg swelling, presyncope, palpitations, heartburn, abdominal pain, anorexia, nausea, vomiting, diarrhea  or change in bowel or urinary habits, change in stools or urine, dysuria,hematuria,   rash, arthralgias, visual complaints, headache, numbness weakness or ataxia or problems with walking or coordination,  change in mood/affect or memory.                  Objective:   Physical Exam  Obese bf nad   Wt 213  12/18/10 > 01/03/2011  214 > 01/31/2011  206 >  09/11/2012 209 >208 6/19 > 210 09/25/2012   HEENT: nl dentition, turbinates, and orophanx. Nl external ear canals without cough reflex   NECK :  without JVD/Nodes/TM/ nl carotid upstrokes bilaterally   LUNGS: no acc muscle use, clear to A and P bilaterally without cough on insp or exp maneuvers   CV:  RRR  no s3 or murmur or increase in P2, no edema   ABD:  soft and nontender with nl excursion in the supine position. No bruits or organomegaly, bowel sounds nl  MS:  warm without deformities, calf tenderness, cyanosis or clubbing              Assessment & Plan:

## 2012-09-25 NOTE — Patient Instructions (Addendum)
Stop Ipratropium Neb.  Continue on current regimen .  We will change Bystolic to Bisoprolol 10mg  Twice daily   We will get paperwork for Drug assistance program for [facility] [place]  follow up Dr. Sherene Sires  In 6 weeks and As needed

## 2012-09-25 NOTE — Assessment & Plan Note (Signed)
Controlled on rx  Change rx to generic for bsytolic w/ bisoprolol  follow up with PCP for B/P management.  Meds reviewed and updated.

## 2012-11-06 ENCOUNTER — Ambulatory Visit: Payer: Self-pay | Admitting: Internal Medicine

## 2012-11-10 ENCOUNTER — Encounter: Payer: Self-pay | Admitting: Internal Medicine

## 2012-11-16 ENCOUNTER — Encounter: Payer: Self-pay | Admitting: Internal Medicine

## 2012-11-17 ENCOUNTER — Ambulatory Visit: Payer: Self-pay | Admitting: Internal Medicine

## 2012-11-18 ENCOUNTER — Other Ambulatory Visit: Payer: Self-pay | Admitting: Oncology

## 2012-11-20 ENCOUNTER — Ambulatory Visit (INDEPENDENT_AMBULATORY_CARE_PROVIDER_SITE_OTHER): Payer: Self-pay | Admitting: Internal Medicine

## 2012-11-20 ENCOUNTER — Encounter: Payer: Self-pay | Admitting: Internal Medicine

## 2012-11-20 VITALS — BP 142/98 | HR 108 | Temp 99.4°F | Ht 67.0 in | Wt 211.0 lb

## 2012-11-20 DIAGNOSIS — I10 Essential (primary) hypertension: Secondary | ICD-10-CM

## 2012-11-20 DIAGNOSIS — J45909 Unspecified asthma, uncomplicated: Secondary | ICD-10-CM

## 2012-11-20 NOTE — Patient Instructions (Addendum)
See Tammy NP in  2 weeks with all your medications, even over the counter meds, separated in two separate bags, the ones you take no matter what vs the ones you stop once you feel better and take only as needed when you feel you need them.   Tammy  will generate for you a new user friendly medication calendar that will put [place] all on the same page re: your medication use.     Without this process, it simply isn't possible to assure that we are providing  your outpatient care  with  the attention to detail we feel you deserve.   If we cannot assure that you're getting that kind of care,  then we cannot manage your problem effectively from this clinic.  Once you have seen Tammy and we are sure that we're all on the same page with your medication use she will arrange follow up with me with repeat pft's

## 2012-11-20 NOTE — Progress Notes (Signed)
Subjective:    Patient ID: Erica [name], female    DOB: [birth year]   MRN: [id number]    Brief patient profile:  65 yobf with asthma all her life much worse since around 07/2010 referred by Dr Darnelle Catalan to pulmonary clinic in Sept 2012   HPI 12/18/2010 Initial pulmonary office eval on acei and   advair cc dtc asthma x 4 months but baseline = excess saba use "even when better" at least twice daily despite daily advair, worse at hs in fact  used saba 3 x last night.  On chemo /RT for breast ca but last rx was Jan 2012 well before asthma worse.  To er multiple times , no better with singular added x 3 months. rec Stop Lisinopril Dulera 200 Take 2 puffs first thing in am and then another 2 puffs about 12 hours later.  Work on inhaler technique  Nexium 40 mg   Take 30-60 min before first meal of the day and Pepcid 20 mg one bedtime until return Only use your albuterol as a rescue medication to be used if you can't catch your breath by resting or doing a relaxed purse lip breathing pattern. The less you use it, the better it will work when you need it.  Prednisone 10 mg take  4 each am x 2 days,   2 each am x 2 days,  1 each am x2days and stop    01/03/2011 f/u ov/Erica [name] cc much better off ace and advair but still needing saba twice daily on avg. No  Purulent sputum. rec no change in rx/ rule of 2's reviewed       09/11/2012 f/u ov/Erica [name] "did fine" p prev ov then resumed advair 250  And now on normodyne Chief Complaint  Patient presents with  . Acute Visit    Pt c/o increased cough and SOBfor the past 2 wks. Cough is occ prod with minimal clear sputum. Breathing is esp worse at night, waking up 2-3 times at night to use neb.   >>Prednisone 10 mg take  4 each am x 2 days,   2 each am x 2 days,  1 each am x 2 days and stop  Stop advair and normodyne bystolic 20 mg every 12 hours (2 x 10 every 12 hours)  dulera 100 Take 2 puffs first thing in am and then another 2 puffs about 12 hours later    09/25/2012 Med calendar  Returns for 1 week follow up for med review  We reviewed all her meds .Data base down for med calendars.  She depends on samples , rx to cone assistance program and generics -as she does not have insurance.  We discussed drug assistance program for [facility].  Will need to change bystolic to bisoprolol for cost issues.  Last visit labs revealed neg d dimer and bnp. CXR w/out acute process.  She feels about the same still gets DOE . Occasional cough and wheezing on/off.  No fever or discolored mucus.  Lives part-time in [facility].  rec Stop Ipratropium Neb.  Continue on current regimen .  We will change Bystolic to Bisoprolol 10mg  Twice daily   We will get paperwork for Drug assistance program for [facility]    11/20/2012 f/u ov/Erica [name] re asthma Chief Complaint  Patient presents with  . Asthma    Breathing has gotten worse since last OV. Pt reports increased wheezing and SOB and DOE. Pt  using rescue inhaler  cough is variably quite severe worse at  hs takes 2 tramadol to control, no excess mucus production. Barking quality. Breathing much better when not coughing, not much better with saba. Present 24/7 but better p the 2 tramadol at hs.  Coughs so hard hurts ant cp mostly center  No obvious daytime variabilty or assoc lateralizing cp or chest tightness, subjective wheeze overt sinus or hb symptoms. No unusual exp hx or h/o childhood pna  or knowledge of premature birth.   . Also denies any obvious fluctuation of symptoms with weather or environmental changes or other aggravating or alleviating factors except as outlined above        Current Medications, Allergies, Past Medical History, Past Surgical History, Family History, and Social History were reviewed in [employer] record.  ROS  The following are not active complaints unless bolded sore throat, dysphagia, dental problems, itching, sneezing,  nasal congestion or excess/ purulent secretions, ear  ache,   fever, chills, sweats, unintended wt loss, pleuritic or exertional cp, hemoptysis,  orthopnea pnd or leg swelling, presyncope, palpitations, heartburn, abdominal pain, anorexia, nausea, vomiting, diarrhea  or change in bowel or urinary habits, change in stools or urine, dysuria,hematuria,  rash, arthralgias, visual complaints, headache, numbness weakness or ataxia or problems with walking or coordination,  change in mood/affect or memory.            Objective:   Physical Exam  Obese bf nad   Wt 213  12/18/10 > 01/03/2011  214 > 01/31/2011  206 >  09/11/2012 209 >208 6/19 > 210 09/25/2012 > 11/20/2012 211   HEENT: nl dentition, turbinates, and orophanx. Nl external ear canals without cough reflex   NECK :  without JVD/Nodes/TM/ nl carotid upstrokes bilaterally   LUNGS: no acc muscle use, clear to A and P bilaterally without cough on insp or exp maneuvers   CV:  RRR  no s3 or murmur or increase in P2, no edema   ABD:  soft and nontender with nl excursion in the supine position. No bruits or organomegaly, bowel sounds nl  MS:  warm without deformities, calf tenderness, cyanosis or clubbing      cxr 09/17/12    No acute abnormality      Assessment & Plan:

## 2012-11-21 NOTE — Assessment & Plan Note (Signed)
Still not ideal but improved vs prev values > will have her return with all meds in hand and regroup with what she's taking before adding any new Rx

## 2012-11-21 NOTE — Assessment & Plan Note (Signed)
-   PFT's 01/31/2011 FEV1  1.76 (60% ) ratio 68 and no better p B2,  DLCO 70% -med review 09/25/2012  - Dulera drug assistance  paperwork given.   DDX of  difficult airways managment all start with A and  include Adherence, Ace Inhibitors, Acid Reflux, Active Sinus Disease, Alpha 1 Antitripsin deficiency, Anxiety masquerading as Airways dz,  ABPA,  allergy(esp in young), Aspiration (esp in elderly), Adverse effects of DPI,  Active smokers, plus two Bs  = Bronchiectasis and Beta blocker use..and one C= CHF  Adherence is always the initial "prime suspect" and is a multilayered concern that requires a "trust but verify" approach in every patient - starting with knowing how to use medications, especially inhalers, correctly, keeping up with refills and understanding the fundamental difference between maintenance and prns vs those medications only taken for a very short course and then stopped and not refilled. The proper method of use, as well as anticipated side effects, of a metered-dose inhaler are discussed and demonstrated to the patient. Improved effectiveness after extensive coaching during this visit to a level of approximately  90% from baseline of < 50%   ? Acid reflux > max rx reviewed  Not clear she really maintaining med reconciliation long enough between visits to sort out whether truly "refractory" symptoms at this point   To keep things simple, I have asked the patient to first separate medicines that are perceived as maintenance, that is to be taken daily "no matter what", from those medicines that are taken on only on an as-needed basis and I have given the patient examples of both, and then return to see our NP to generate a  detailed  medication calendar which should be followed until the next physician sees the patient and updates it.

## 2012-11-26 ENCOUNTER — Ambulatory Visit (AMBULATORY_SURGERY_CENTER): Payer: Self-pay

## 2012-11-26 VITALS — Ht 67.0 in | Wt 209.0 lb

## 2012-11-26 DIAGNOSIS — R1012 Left upper quadrant pain: Secondary | ICD-10-CM

## 2012-12-04 ENCOUNTER — Telehealth: Payer: Self-pay | Admitting: Internal Medicine

## 2012-12-04 ENCOUNTER — Encounter: Payer: Self-pay | Admitting: Adult Health

## 2012-12-04 NOTE — Telephone Encounter (Signed)
Error.Erica [name] ° °

## 2012-12-07 ENCOUNTER — Ambulatory Visit (INDEPENDENT_AMBULATORY_CARE_PROVIDER_SITE_OTHER): Payer: Self-pay | Admitting: Adult Health

## 2012-12-07 ENCOUNTER — Encounter: Payer: Self-pay | Admitting: Adult Health

## 2012-12-07 VITALS — BP 128/76 | HR 85 | Temp 98.4°F | Ht 67.0 in | Wt 208.0 lb

## 2012-12-07 DIAGNOSIS — J45909 Unspecified asthma, uncomplicated: Secondary | ICD-10-CM

## 2012-12-07 MED ORDER — AZITHROMYCIN 250 MG PO TABS: ORAL_TABLET | ORAL | Status: AC

## 2012-12-07 MED ORDER — PREDNISONE 10 MG PO TABS: ORAL_TABLET | ORAL | Status: DC

## 2012-12-07 NOTE — Patient Instructions (Addendum)
Zpack take as directed  Mucinex DM Twice daily   Prednisone taper over next week  Follow med calendar closely and bring to each visit.  follow up Dr. Sherene Sires  In 4 with with PFT As needed

## 2012-12-08 NOTE — Addendum Note (Signed)
Addended by: Boone Master E on: 12/08/2012 12:23 PM   Modules accepted: Orders

## 2012-12-08 NOTE — Assessment & Plan Note (Signed)
Mild flare with URI  Patient's medications were reviewed today and patient education was given. Computerized medication calendar was adjusted/completed   Plan  Zpack take as directed  Mucinex DM Twice daily   Prednisone taper over next week  Follow med calendar closely and bring to each visit.  follow up Dr. Sherene Sires  In 4 with with PFT As needed

## 2012-12-08 NOTE — Progress Notes (Signed)
Subjective:    Patient ID: Erica [name], female    DOB: 11/11/1975   MRN: [id number]    Brief patient profile:  3 yobf with asthma all her life much worse since around 07/2010 referred by Dr Darnelle Catalan to pulmonary clinic in Sept 2012   HPI 12/18/2010 Initial pulmonary office eval on acei and   advair cc dtc asthma x 4 months but baseline = excess saba use "even when better" at least twice daily despite daily advair, worse at hs in fact  used saba 3 x last night.  On chemo /RT for breast ca but last rx was Jan 2012 well before asthma worse.  To er multiple times , no better with singular added x 3 months. rec Stop Lisinopril Dulera 200 Take 2 puffs first thing in am and then another 2 puffs about 12 hours later.  Work on inhaler technique  Nexium 40 mg   Take 30-60 min before first meal of the day and Pepcid 20 mg one bedtime until return Only use your albuterol as a rescue medication to be used if you can't catch your breath by resting or doing a relaxed purse lip breathing pattern. The less you use it, the better it will work when you need it.  Prednisone 10 mg take  4 each am x 2 days,   2 each am x 2 days,  1 each am x2days and stop    01/03/2011 f/u ov/Wert cc much better off ace and advair but still needing saba twice daily on avg. No  Purulent sputum. rec no change in rx/ rule of 2's reviewed       09/11/2012 f/u ov/Wert "did fine" p prev ov then resumed advair 250  And now on normodyne Chief Complaint  Patient presents with  . Acute Visit    Pt c/o increased cough and SOBfor the past 2 wks. Cough is occ prod with minimal clear sputum. Breathing is esp worse at night, waking up 2-3 times at night to use neb.   >>Prednisone 10 mg take  4 each am x 2 days,   2 each am x 2 days,  1 each am x 2 days and stop  Stop advair and normodyne bystolic 20 mg every 12 hours (2 x 10 every 12 hours)  dulera 100 Take 2 puffs first thing in am and then another 2 puffs about 12 hours later    09/25/2012 Med calendar  Returns for 1 week follow up for med review  We reviewed all her meds .Data base down for med calendars.  She depends on samples , rx to [facility] assistance program and generics -as she does not have insurance.  We discussed drug assistance program for [facility].  Will need to change bystolic to bisoprolol for cost issues.  Last visit labs revealed neg d dimer and bnp. CXR w/out acute process.  She feels about the same still gets DOE . Occasional cough and wheezing on/off.  No fever or discolored mucus.  Lives part-time in [facility].  rec Stop Ipratropium Neb.  Continue on current regimen .  We will change Bystolic to Bisoprolol 10mg  Twice daily   We will get paperwork for Drug assistance program for [facility]    11/20/2012 f/u ov/Wert re asthma Chief Complaint  Patient presents with  . Asthma    Breathing has gotten worse since last OV. Pt reports increased wheezing and SOB and DOE. Pt  using rescue inhaler  cough is variably quite severe worse at  hs takes 2 tramadol to control, no excess mucus production. Barking quality. Breathing much better when not coughing, not much better with saba. Present 24/7 but better p the 2 tramadol at hs.  Coughs so hard hurts ant cp mostly center >no changes   12/07/12 Follow up and Med review  new med calendar - pt brought all meds with her today.   We reviewed all her meds and organized them into a patient medication calendar.  It appears she is taking her meds correctly however she depends on samples and drug assistance program for some meds.  She does complain over last 2 weeks of cough, congestion, sinus drainage and wheezing  No fever, chest pain or orthopnea.  OTC cold meds not working on congestion.       Current Medications, Allergies, Past Medical History, Past Surgical History, Family History, and Social History were reviewed in [employer] record.  ROS  The following are not active complaints unless  bolded sore throat, dysphagia, dental problems, itching, sneezing,  nasal congestion or excess/ purulent secretions, ear ache,   fever, chills, sweats, unintended wt loss, pleuritic or exertional cp, hemoptysis,  orthopnea pnd or leg swelling, presyncope, palpitations, heartburn, abdominal pain, anorexia, nausea, vomiting, diarrhea  or change in bowel or urinary habits, change in stools or urine, dysuria,hematuria,  rash, arthralgias, visual complaints, headache, numbness weakness or ataxia or problems with walking or coordination,  change in mood/affect or memory.            Objective:   Physical Exam  Obese bf nad   Wt 213  12/18/10 > 01/03/2011  214 > 01/31/2011  206 >  09/11/2012 209 >208 6/19 > 210 09/25/2012 > 11/20/2012 211  >208 12/07/12  HEENT: nl dentition, turbinates, and orophanx. Nl external ear canals without cough reflex   NECK :  without JVD/Nodes/TM/ nl carotid upstrokes bilaterally   LUNGS: few exp wheezes   CV:  RRR  no s3 or murmur or increase in P2, no edema   ABD:  soft and nontender with nl excursion in the supine position. No bruits or organomegaly, bowel sounds nl  MS:  warm without deformities, calf tenderness, cyanosis or clubbing      cxr 09/17/12    No acute abnormality      Assessment & Plan:

## 2012-12-10 ENCOUNTER — Ambulatory Visit (AMBULATORY_SURGERY_CENTER): Payer: Self-pay | Admitting: Internal Medicine

## 2012-12-10 ENCOUNTER — Encounter: Payer: Self-pay | Admitting: Internal Medicine

## 2012-12-10 VITALS — BP 148/66 | HR 79 | Temp 98.6°F | Resp 19 | Ht 67.0 in | Wt 209.0 lb

## 2012-12-10 DIAGNOSIS — R1012 Left upper quadrant pain: Secondary | ICD-10-CM

## 2012-12-10 DIAGNOSIS — Z8711 Personal history of peptic ulcer disease: Secondary | ICD-10-CM

## 2012-12-10 DIAGNOSIS — Z8719 Personal history of other diseases of the digestive system: Secondary | ICD-10-CM

## 2012-12-10 MED ORDER — SODIUM CHLORIDE 0.9 % IV SOLN: 500.0000 mL | INTRAVENOUS | Status: DC

## 2012-12-10 MED ORDER — PANTOPRAZOLE SODIUM 40 MG PO TBEC: 40.0000 mg | DELAYED_RELEASE_TABLET | Freq: Every day | ORAL | Status: DC
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
## 2012-12-10 NOTE — Patient Instructions (Addendum)

## 2012-12-10 NOTE — Op Note (Signed)
[facility] [address] [place], [address]   ENDOSCOPY PROCEDURE REPORT  PATIENT: Erica [name], Erica [name]  MR#: [id number] BIRTHDATE: [birth year] , 37  yrs. old GENDER: Female ENDOSCOPIST: Beverley Fiedler, MD PROCEDURE DATE:  12/10/2012 PROCEDURE:  EGD, diagnostic ASA CLASS:     Class III INDICATIONS:  surveillance of previous gastric ulcer (Last EGD March 2014).   abdominal pain in upper left quadrant. MEDICATIONS: MAC sedation, administered by CRNA and propofol (Diprivan) 150mg  IV TOPICAL ANESTHETIC: Cetacaine Spray  DESCRIPTION OF PROCEDURE: After the risks benefits and alternatives of the procedure were thoroughly explained, informed consent was obtained.  The LB [id number] endoscope was introduced through the mouth and advanced to the second portion of the duodenum. Without limitations.  The instrument was slowly withdrawn as the mucosa was fully examined.    ESOPHAGUS: The mucosa of the esophagus appeared normal.  STOMACH: Food residue was found in the gastric antrum, which precluded complete visualization of the antrum.  The previously seen antral gastritis has improved. Previous greater curvature ulceration has apparently healed.  DUODENUM: The duodenal mucosa showed no abnormalities in the bulb and second portion of the duodenum.  Retroflexed views revealed no abnormalities.     The scope was then withdrawn from the patient and the procedure completed.  COMPLICATIONS: There were no complications. ENDOSCOPIC IMPRESSION: 1.   The mucosa of the esophagus appeared normal 2.   Food residue in the gastric antrum suggestive of gastroparesis 3.   Previous greater curvature ulceration has apparently healed. 4.   The duodenal mucosa showed no abnormalities in the bulb and second portion of the duodenum  RECOMMENDATIONS: 1.  Gastroparesis diet 2.  Office follow-up to discuss further management of probable gastroparesis  eSigned:  Beverley Fiedler, MD  12/10/2012 9:43 AM   CC:The Patient, [name], MD, and Ruthann Cancer, MD

## 2012-12-10 NOTE — Progress Notes (Signed)
Patient did not experience any of the following events: a burn prior to discharge; a fall within the facility; wrong site/side/patient/procedure/implant event; or a hospital transfer or hospital admission upon discharge from the facility. (G8907) Patient did not have preoperative order for IV antibiotic SSI prophylaxis. (G8918)  

## 2012-12-11 ENCOUNTER — Telehealth: Payer: Self-pay | Admitting: *Deleted

## 2012-12-11 NOTE — Telephone Encounter (Signed)
  Follow up Call-  Call back number 12/10/2012 07/28/2012  Post procedure Call Back phone  # [phone]  Permission to leave phone message Yes Yes     Patient questions:  Do you have a fever, pain , or abdominal swelling? no Pain Score  0 *  Have you tolerated food without any problems? yes  Have you been able to return to your normal activities? yes  Do you have any questions about your discharge instructions: Diet   no Medications  no Follow up visit  no  Do you have questions or concerns about your Care? no  Actions: * If pain score is 4 or above: No action needed, pain <4.

## 2012-12-11 NOTE — Telephone Encounter (Signed)
Dr Rhea Belton ordered a f/u visit for pt after EGD on 12/10/12. Informed her unless she comes in on 12/14/12, Dr Rhea Belton won't have anything else until October. Pt will come on 01/04/13 at 2:45.

## 2012-12-28 ENCOUNTER — Encounter: Payer: Self-pay | Admitting: Internal Medicine

## 2013-01-04 ENCOUNTER — Ambulatory Visit (INDEPENDENT_AMBULATORY_CARE_PROVIDER_SITE_OTHER): Payer: Self-pay | Admitting: Internal Medicine

## 2013-01-04 ENCOUNTER — Encounter: Payer: Self-pay | Admitting: Internal Medicine

## 2013-01-04 VITALS — BP 168/84 | HR 99 | Ht 64.5 in | Wt 209.0 lb

## 2013-01-04 DIAGNOSIS — R079 Chest pain, unspecified: Secondary | ICD-10-CM

## 2013-01-04 DIAGNOSIS — R110 Nausea: Secondary | ICD-10-CM

## 2013-01-04 DIAGNOSIS — K3184 Gastroparesis: Secondary | ICD-10-CM | POA: Insufficient documentation

## 2013-01-04 DIAGNOSIS — R1012 Left upper quadrant pain: Secondary | ICD-10-CM

## 2013-01-04 MED ORDER — METOCLOPRAMIDE HCL 10 MG PO TABS: 10.0000 mg | ORAL_TABLET | Freq: Three times a day (TID) | ORAL | Status: DC

## 2013-01-04 NOTE — Patient Instructions (Addendum)
We have sent the following medications to your pharmacy for you to pick up at your convenience: reglan  You have been referred to [facility] located on [address]. Your appointment is on 01/25/2013 @ 2:30pm With Dr. Delton See Please arrive 15 minutes prior to your appointment. If you cannot make this appointment you can call them at [phone]  Please follow up with Dr. Rhea Belton in office in 2-3 weeks                                               We are excited to introduce MyChart, a new best-in-class service that provides you online access to important information in your electronic medical record. We want to make it easier for you to view your health information - all in one secure location - when and where you need it. We expect MyChart will enhance the quality of care and service we provide.  When you register for MyChart, you can:    View your test results.    Request appointments and receive appointment reminders via email.    Request medication renewals.    View your medical history, allergies, medications and immunizations.    Communicate with your physician's office through a password-protected site.    Conveniently print information such as your medication lists.  To find out if MyChart is right for you, please talk to a member of our clinical staff today. We will gladly answer your questions about this free health and wellness tool.  If you are age 39 or older and want a member of your family to have access to your record, you must provide written consent by completing a proxy form available at our office. Please speak to our clinical staff about guidelines regarding accounts for patients younger than age 38.  As you activate your MyChart account and need any technical assistance, please call the MyChart technical support line at [phone]) or email your question to [email or web address]. If you email your question(s), please include your name, a  return phone number and the best time to reach you.  If you have non-urgent health-related questions, you can send a message to our office through MyChart at [email or web address]. If you have a medical emergency, call 911.  Thank you for using MyChart as your new health and wellness resource!   MyChart licensed from [employer],  [id number]. Patents Pending.

## 2013-01-04 NOTE — Progress Notes (Signed)
Subjective:    Patient ID: Erica [name], female    DOB: Mar 12, 1976, 37 y.o.   MRN: [id number]  HPI Brittainy is a 37 yo female with PMH of breast cancer status post lumpectomy with chemotherapy/XRT felt currently to be in remission, asthma, hypertension, GERD, and gastric ulcers who is seen in followup.  Last seen in the office in February 2014 to evaluate ongoing left upper quadrant pain. Initially endoscopy was performed in April 2014 which revealed gastric ulcer not associated with H. pylori based on biopsies. She was started on twice daily PPI therapy and came for surveillance of gastric ulcer on 12/10/2012. This endoscopy revealed a moderate amount of retained food residue in the stomach consistent with gastroparesis. The previously identified greater curvature gastric ulcer had healed. The duodenal mucosa showed no abnormalities in the examined segments.  Today she reports she continues to struggle with multiple issues. She still has left upper quadrant abdominal pain. This is often associated with nausea and occasional vomiting. This pain has been going on for months now. She reports at times she feels like her "heart hurts" and she has associated dyspnea. She denies heartburn. Bowel movements have been okay with no blood in her stool or melena. She is occasionally using tramadol for this pain with only minor relief. Levsin also help some but seems to take too long. She denies hematemesis, melena or hematochezia. She is somewhat tearful due to the ongoing pain. She reports her primary care provider has considered a pain management referral. She has upcoming oncology followup given her history of breast cancer. Upcoming pulmonary followup given her history of asthma, and followup with Dr. Luisa Hart at [facility] surgery to discuss surgery for axillary hidradenitis.  To this point she has not specifically tried a gastroparesis diet but notes that she is only eating approximately once per day to her  overall pain and nausea. She has not lost weight in fact she has gained some weight.  Review of Systems As per HPI, otherwise negative  Current Medications, Allergies, Past Medical History, Past Surgical History, Family History and Social History were reviewed in [employer] record.     Objective:   Physical Exam BP 168/84  Pulse 99  Ht 5' 4.5" (1.638 m)  Wt 209 lb (94.802 kg)  BMI 35.33 kg/m2  LMP 12/11/2012  Breastfeeding? No Constitutional: Well-developed and well-nourished. No distress. HEENT: Normocephalic and atraumatic. Oropharynx is clear and moist. No oropharyngeal exudate. Conjunctivae are normal.  No scleral icterus. Cardiovascular: Normal rate, regular rhythm and intact distal pulses.  Pulmonary/chest: Effort normal and breath sounds normal. No wheezing, rales or rhonchi. Abdominal: Soft, epigastric and left upper quadrant tenderness without rebound or guarding, nondistended. Bowel sounds active throughout. Extremities: no clubbing, cyanosis, or edema Neurological: Alert and oriented to person place and time. Skin: Skin is warm and dry. No rashes noted. Psychiatric: Somewhat tearful at times during the interview with normal affect. Behavior is normal   CBC    Component Value Date/Time   WBC 7.0 09/10/2012 1618   WBC 5.3 06/03/2012 1616   RBC 4.52 09/10/2012 1618   RBC 4.52 06/03/2012 1616   HGB 14.1 09/10/2012 1618   HGB 14.3 06/03/2012 1616   HCT 40.9 09/10/2012 1618   HCT 41.2 06/03/2012 1616   PLT 214 09/10/2012 1618   PLT 186.0 06/03/2012 1616   MCV 90.4 09/10/2012 1618   MCV 91.2 06/03/2012 1616   MCH 31.2 09/10/2012 1618   MCH 31.8 03/07/2012 0830  MCHC 34.5 09/10/2012 1618   MCHC 34.6 06/03/2012 1616   RDW 14.0 09/10/2012 1618   RDW 13.4 06/03/2012 1616   LYMPHSABS 1.9 09/10/2012 1618   LYMPHSABS 1.3 06/03/2012 1616   MONOABS 0.6 09/10/2012 1618   MONOABS 0.4 06/03/2012 1616   EOSABS 0.1 09/10/2012 1618   EOSABS 0.1 06/03/2012 1616   BASOSABS 0.1  09/10/2012 1618   BASOSABS 0.1 06/03/2012 1616   CMP     Component Value Date/Time   NA 137 09/17/2012 1250   NA 139 09/10/2012 1618   K 3.9 09/17/2012 1250   K 3.7 09/10/2012 1618   CL 106 09/17/2012 1250   CL 103 09/10/2012 1618   CO2 24 09/17/2012 1250   CO2 27 09/10/2012 1618   GLUCOSE 107* 09/17/2012 1250   GLUCOSE 86 09/10/2012 1618   BUN 15 09/17/2012 1250   BUN 11.9 09/10/2012 1618   CREATININE 0.9 09/17/2012 1250   CREATININE 1.3* 09/10/2012 1618   CREATININE 0.64 05/02/2011 1021   CALCIUM 9.6 09/17/2012 1250   CALCIUM 9.3 09/10/2012 1618   PROT 7.6 09/10/2012 1618   PROT 7.4 08/05/2012 1344   ALBUMIN 3.7 09/10/2012 1618   ALBUMIN 3.6 08/05/2012 1344   AST 19 09/10/2012 1618   AST 21 08/05/2012 1344   ALT 28 09/10/2012 1618   ALT 22 08/05/2012 1344   ALKPHOS 62 09/10/2012 1618   ALKPHOS 50 08/05/2012 1344   BILITOT 0.32 09/10/2012 1618   BILITOT 0.7 08/05/2012 1344   GFRNONAA 83* 03/07/2012 0830   GFRAA >90 03/07/2012 0830    CT ABDOMEN AND PELVIS WITH CONTRAST - performed 05/28/2012   Technique:  Multidetector CT imaging of the abdomen and pelvis was performed following the standard protocol during bolus administration of intravenous contrast.   Contrast: OMNIPAQUE IOHEXOL 300 MG/ML  SOLN   Comparison: 03/21/2011.   Findings: The lung bases are clear.  No pulmonary nodule or pleural effusion.  Skin thickening involving the left breast likely due to radiation change.   There is diffuse fatty infiltration of the liver but no focal hepatic lesions or intrahepatic biliary dilatation.  Areas of focal fatty sparing are noted.  The gallbladder is normal.  No common bile duct dilatation.  The pancreas is normal.  The spleen is normal in size.  No focal lesions.  The adrenal glands and kidneys are normal and stable.  Small cysts are noted.  Stable hyperdense cyst associated with the right kidney.   The stomach, duodenum, small bowel and colon are unremarkable.  No inflammatory changes or  mass lesions.  The appendix is normal.  No mesenteric or retroperitoneal mass or adenopathy.  The aorta is normal in caliber.  The major branch vessels are normal.   The uterus and ovaries are unremarkable.  There is a simple appearing right ovarian cyst.  No pelvic mass, adenopathy or free pelvic fluid collections.  The bladder is normal.  No inguinal mass or hernia.  Small inguinal lymph nodes are noted.   The bony structures are unremarkable.   IMPRESSION:   1.  Fatty infiltration of the liver with areas of focal fatty sparing but no findings for metastatic disease. 2.  No acute abdominal/pelvic findings, mass lesions or adenopathy. 3.  Stable hyperdense/complex right renal cyst since 2010. 4.  3 cm right ovarian cyst.        Assessment & Plan:  37 yo female with PMH of breast cancer status post lumpectomy with chemotherapy/XRT felt currently to be in remission,  asthma, hypertension, GERD, and gastric ulcers who is seen in followup.  1.  LUQ pain/nausea -- she has a history of gastric ulcers, initially felt responsible for her epigastric and left upper quadrant abdominal pain. These were treated with twice-daily PPI and were not found to be H. pylori associated. Surveillance endoscopy proved the ulcers that have healed, but did find a moderate amount of retained food in the stomach consistent with gastroparesis. Gastroparesis could explain her nausea and essentially her chronic epigastric and left upper quadrant pain. I have verified no solid food intake for at least 10 hours prior to her endoscopy.  She has had cross-sectional imaging which was unremarkable from her epigastric and left upper quadrant pain standpoint. Her bowel movements have been normal without alarm symptoms. We discussed gastroparesis at length today, and she is not taking chronic narcotics nor is she known to be diabetic. I recommended a gastroparesis diet we'll give her a trial of metoclopramide 10 mg before meals  and at bedtime. Given that she is not eating but approximately one militate, I would like for her to take this at least in the morning and at bedtime each day during the trial period.  We discussed long-term side effects of metoclopramide therapy including dystonic reactions, and the long-term small but measurable risk of irreversible neurologic problems such as tardive dyskinesia. If she does benefit from this medication and long-term promotility agents are needed, if we could consider switching to domperidone. Like to see her back in 2-3 weeks to reassess her symptoms and response to this therapy. This may be with an advanced practitioner if my availability is limited.  2.  Chest pain -- she is having chest pressure along with dyspnea which is clearing somewhat at random. She's not convinced that this is exertional. She has a history of asthma but feels that this pain and shortness of breath is not consistent with her previous asthma attacks.  She has asked for cardiology referral and she will be referred today.

## 2013-01-07 ENCOUNTER — Encounter (INDEPENDENT_AMBULATORY_CARE_PROVIDER_SITE_OTHER): Payer: Self-pay

## 2013-01-07 ENCOUNTER — Other Ambulatory Visit (HOSPITAL_BASED_OUTPATIENT_CLINIC_OR_DEPARTMENT_OTHER): Payer: Self-pay | Admitting: Lab

## 2013-01-07 ENCOUNTER — Ambulatory Visit (HOSPITAL_BASED_OUTPATIENT_CLINIC_OR_DEPARTMENT_OTHER): Payer: Self-pay | Admitting: Oncology

## 2013-01-07 ENCOUNTER — Telehealth: Payer: Self-pay | Admitting: Oncology

## 2013-01-07 VITALS — BP 164/97 | HR 106 | Temp 98.7°F | Resp 19 | Ht 64.5 in | Wt 210.0 lb

## 2013-01-07 DIAGNOSIS — Z171 Estrogen receptor negative status [ER-]: Secondary | ICD-10-CM

## 2013-01-07 DIAGNOSIS — G609 Hereditary and idiopathic neuropathy, unspecified: Secondary | ICD-10-CM

## 2013-01-07 DIAGNOSIS — C50412 Malignant neoplasm of upper-outer quadrant of left female breast: Secondary | ICD-10-CM

## 2013-01-07 DIAGNOSIS — C50419 Malignant neoplasm of upper-outer quadrant of unspecified female breast: Secondary | ICD-10-CM

## 2013-01-07 DIAGNOSIS — R42 Dizziness and giddiness: Secondary | ICD-10-CM

## 2013-01-07 DIAGNOSIS — C50912 Malignant neoplasm of unspecified site of left female breast: Secondary | ICD-10-CM

## 2013-01-07 HISTORY — DX: Malignant neoplasm of upper-outer quadrant of left female breast: Z17.1

## 2013-01-07 HISTORY — DX: Estrogen receptor negative status (ER-): C50.412

## 2013-01-07 LAB — CBC WITH DIFFERENTIAL/PLATELET
BASO%: 0.8 % (ref 0.0–2.0)
EOS%: 2.4 % (ref 0.0–7.0)
MCH: 31.5 pg (ref 25.1–34.0)
MCHC: 34.3 g/dL (ref 31.5–36.0)
NEUT%: 64 % (ref 38.4–76.8)
RDW: 14 % (ref 11.2–14.5)
lymph#: 1.5 10*3/uL (ref 0.9–3.3)

## 2013-01-07 LAB — COMPREHENSIVE METABOLIC PANEL (CC13)
ALT: 33 U/L (ref 0–55)
AST: 28 U/L (ref 5–34)
Calcium: 9 mg/dL (ref 8.4–10.4)
Chloride: 107 mEq/L (ref 98–109)
Creatinine: 0.8 mg/dL (ref 0.6–1.1)

## 2013-01-07 MED ORDER — PANTOPRAZOLE SODIUM 40 MG PO TBEC: 40.0000 mg | DELAYED_RELEASE_TABLET | Freq: Every day | ORAL | Status: DC

## 2013-01-07 MED ORDER — LORAZEPAM 0.5 MG PO TABS: 0.5000 mg | ORAL_TABLET | Freq: Every evening | ORAL | Status: DC | PRN

## 2013-01-07 NOTE — Progress Notes (Signed)
ID: Erica [name]   DOB: [birth year]  MR#: [id number]  WUJ#:[id number]  NFA:[id number] [name], MD GYN: SU:  Harriette Bouillon, MD Other:  Sandrea Hughs, MD;  Erick Blinks, MD  HISTORY OF PRESENT ILLNESS: Erica [name] noticed a lump in her left breast sometime in August or September 2009.  Initially this was thought to possibly be "dry breast milk" but as the mass got bigger, she brought it to her physician's attention and was set up for mammography.  This was performed May 20, 2008 at [facility] and it showed a palpable 8.5 cm oval circumscribed mass in the left breast upper outer quadrant.  There were prominent left axillary lymph nodes noted as well.  An ultrasound showed this to be hypoechoic, with lobulated margins and probable central necrosis measuring up to 7.3 cm on this modality.  Ultrasound of the left axilla demonstrated numerous prominent lymph nodes at the upper limit of normal, the largest measuring 1 cm with a normal appearing thin cortex and a normal cortex to halo ratio.    Because the patient had no insurance, she was referred to [facility] and biopsy was performed in [place] with the pathology there ([id number]) showing a poorly differentiated malignancy which was triple negative (ER 1%, PR 0%, HER-2/neu 0).  The axillary lymph nodes were not biopsied. With this information, the patient was presented at the Breast Multidisciplinary Conference and the feeling was that, after appropriate staging, the patient would be a good candidate for neoadjuvant treatment and possibly NSABP B40.  Her subsequent history is as detailed below  INTERVAL HISTORY: Erica [name] returns today for followup of her left breast cancer. She lives currently with her mother in [place] and partly here. She is maintaining all her medical care in [place], however.  REVIEW OF SYSTEMS: Erica [name] tells me she has been taking all her blood pressure medications as prescribed. Sometimes when she gets up from a chair she feels  faint location at least she did fall. This is not a matted of vertigo or dizziness, just sudden weakness, likely related to her blood pressure medications. She does still have neuropathy in her hands and feet, but this does not seem to affect her activities of daily living. She denies unusual headaches, nausea or vomiting. She has only blurred vision in the right. The left is "okay". She tells me she was evaluated by Dr. 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 She has been started on Reglan, with some improvement in her symptoms. She had an upper aspartate or infection recently treated by Dr. Sherene Sires. Otherwise a  detailed review of systems today was noncontributory   PAST MEDICAL HISTORY: Past Medical History  Diagnosis Date  . Breast cancer 2010  . Asthma   . Hypertension   . GERD (gastroesophageal reflux disease)   . Impaired glucose tolerance 04/13/2011  . Anemia, unspecified 04/13/2011  . Asthma 09/27/2010  . Bloating 03/18/2011  . 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 Lymphadenitis, chronic     restricted LEFT extremity    PAST SURGICAL HISTORY: Past Surgical History  Procedure Laterality Date  . Hand surgery  2012    right; tendon release  . Breast lumpectomy  09/2008    left  . Carpal tunnel release  2011    left hand  . Tubal ligation  05/11/2011    Procedure: POST PARTUM TUBAL LIGATION;  Surgeon: Scheryl Darter, MD;  Location: [facility];  Service: Gynecology;  Laterality: Bilateral;  Induced for HTN  . Lymph node dissection  2010    left breast; 2 wks after breast lumpectomy    FAMILY HISTORY Family History  Problem Relation Age of Onset  . Diabetes Maternal Grandmother   . Heart disease Maternal Grandmother   . Cancer Neg Hx   . Alcohol abuse Neg Hx   . Early death Neg Hx   . Hyperlipidemia Neg Hx   . Hypertension Neg Hx   . Kidney disease Neg Hx   . Stroke Neg Hx   . Colon cancer Neg Hx   The patient's parents are alive.  The patient has one brother and one sister.  There is no ovarian or breast cancer in the immediate family, but the patient's maternal grandmother, who was one of 10 sisters, had one sister with breast cancer diagnosed in her sixties.    GYNECOLOGIC HISTORY: updated OCT 2013 The patient is GX P4, first pregnancy to term at age 23.  She stopped having periods at the time of chemotherapy, and had had no periods for the last 2 years, then became pregnant. This was a surprise to her, and she was not aware of the pregnancy until the seventh month. She has not resumed normal menses, but has had some "spotting" on and off for the last 3 months.  SOCIAL HISTORY: updated OCT 2013 She has been a Social research officer, government in the past. She's currently disabled. Erica [name] has four children,  currently 8 year old, 39 years old,years old, 10 and 39 years old. Her oldest daughter is attending college in [place].  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 Smoking status: Never Smoker   . Smokeless tobacco: Never Used  . Alcohol Use: No     Comment: occasionally     Colonoscopy:  PAP:  Bone density:  Lipid panel:  Allergies  Allergen Reactions  . Aspirin Shortness Of Breath and Palpitations    Pt can take ibuprofen without reaction  . Penicillins Shortness Of Breath and Palpitations    Current Outpatient Prescriptions  Medication Sig Dispense Refill  . albuterol (PROVENTIL HFA;VENTOLIN HFA) 108 (90 BASE) MCG/ACT inhaler Inhale 2 puffs into the lungs every 4 (four) hours as needed for wheezing or shortness of breath (((PLAN A))).       Marland Kitchen albuterol (PROVENTIL) (2.5 MG/3ML) 0.083% nebulizer solution Take 2.5 mg by nebulization every 4 (four) hours as needed for wheezing or shortness of breath (((PLAN B))). For shortness of breath.      Marland Kitchen amLODipine-olmesartan (AZOR) 5-40 MG per tablet Take 1 tablet by mouth daily.  90 tablet  1  . cloNIDine (CATAPRES) 0.2 MG tablet  Take 1 tablet (0.2 mg total) by mouth 2 (two) times daily.  180 tablet  1  . dextromethorphan-guaiFENesin (MUCINEX DM) 30-600 MG per 12 hr tablet Take 1 tablet by mouth every 12 (twelve) hours as needed (cough, congestion, thick mucus).      . hyoscyamine (LEVSIN SL) 0.125 MG SL tablet Place 0.125 mg under the tongue every 8 (eight) hours as needed for cramping.      Marland Kitchen LORazepam (ATIVAN) 0.5 MG tablet Take 0.5 mg by mouth every 8 (eight) hours as needed for anxiety.       . metoCLOPramide (REGLAN) 10 MG tablet Take 1 tablet (10 mg total) by mouth 3 (three) times daily.  90 tablet  3  . mometasone-formoterol (DULERA) 100-5 MCG/ACT AERO Inhale 2  puffs into the lungs 2 (two) times daily.  13 g  5  . montelukast (SINGULAIR) 10 MG tablet Take 1 tablet (10 mg total) by mouth at bedtime.  30 tablet  12  . nebivolol (BYSTOLIC) 10 MG tablet Take 10 mg by mouth at bedtime.      . pantoprazole (PROTONIX) 40 MG tablet Take 1 tablet (40 mg total) by mouth daily.  60 tablet  3  . potassium chloride SA (K-DUR,KLOR-CON) 20 MEQ tablet Take 20 mEq by mouth 2 (two) times daily.      . predniSONE (DELTASONE) 10 MG tablet 4 tabs for 2 days, then 3 tabs for 2 days, 2 tabs for 2 days, then 1 tab for 2 days, then stop  20 tablet  0  . sucralfate (CARAFATE) 1 G tablet Take 1 tablet (1 g total) by mouth 4 (four) times daily.  30 tablet  0  . traMADol (ULTRAM) 50 MG tablet 1-2 tabs by mouth every 4 hours as needed for cough or pain      . triamterene-hydrochlorothiazide (MAXZIDE-25) 37.5-25 MG per tablet Take 1 tablet by mouth every morning.       No current facility-administered medications for this visit.    OBJECTIVE:  Filed Vitals:   01/07/13 1539  BP: 164/97  Pulse: 106  Temp: 98.7 F (37.1 C)  Resp: 19     Body mass index is 35.5 kg/(m^2).    ECOG FS: 2 Filed Weights   01/07/13 1539  Weight: 210 lb (95.255 kg)   Young [place] American female in no acute distress Sclerae unicteric, both pupils are reactive  to light. Oropharynx clear No cervical or supraclavicular adenopathy Lungs show no crackles or wheezes, with fair excursion bilaterally Heart regular rate and rhythm Abd soft, obese, nontender to palpation, positive bowel sounds  MSK no focal spinal tenderness. No upper extremity lymphedema Neuro: nonfocal, well oriented, pleasant affect Breasts: The right breast is unremarkable. The left breast is status post lumpectomy and radiation. There is some tenderness to palpation and mild residual hyperpigmentation. The left axilla is benign.  LAB RESULTS: Lab Results  Component Value Date   WBC 5.9 01/07/2013   NEUTROABS 3.8 01/07/2013   HGB 13.6 01/07/2013   HCT 39.7 01/07/2013   MCV 91.8 01/07/2013   PLT 209 01/07/2013      Chemistry      Component Value Date/Time   NA 140 01/07/2013 1116   NA 137 09/17/2012 1250   K 4.1 01/07/2013 1116   K 3.9 09/17/2012 1250   CL 106 09/17/2012 1250   CL 103 09/10/2012 1618   CO2 24 01/07/2013 1116   CO2 24 09/17/2012 1250   BUN 8.4 01/07/2013 1116   BUN 15 09/17/2012 1250   CREATININE 0.8 01/07/2013 1116   CREATININE 0.9 09/17/2012 1250   CREATININE 0.64 05/02/2011 1021      Component Value Date/Time   CALCIUM 9.0 01/07/2013 1116   CALCIUM 9.6 09/17/2012 1250   ALKPHOS 56 01/07/2013 1116   ALKPHOS 50 08/05/2012 1344   AST 28 01/07/2013 1116   AST 21 08/05/2012 1344   ALT 33 01/07/2013 1116   ALT 22 08/05/2012 1344   BILITOT 0.23 01/07/2013 1116   BILITOT 0.7 08/05/2012 1344       Lab Results  Component Value Date   LABCA2 11 05/01/2012     STUDIES: No results found. Repeat mammography is due later this month  ASSESSMENT: 37 y.o. [place] woman with history of locally advanced  left breast carcinoma initially presenting in March 2010   (1) treated neoadjuvantly with 4 cycles of docetaxel/ cyclophosphamide.    (2) status post left lumpectomy and sentinel lymph node dissection in July 2010 for a ypT2 ypN1, stage IIB invasive ductal carcinoma, grade 3,  triple negative with MIB-1 of 88%.   (3) status post full axillary dissection and margin clearance in August 2010.   (4) status post 11 doses of weekly paclitaxel completed in December 2010   (5) radiation therapy completed in March 2011.   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 Her blood pressure remains in perfectly controlled, as she does tell me sometimes she stands out and feels very weak and near syncopal, so possibly her medications are maxed at this point. 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 I wish I could resolve these problems for her, but the best advice I have for her is that she make sure to exercise regularly, 30 minutes at a time, twice a day, 5 days a week. We also discussed the diet issues today.  She knows to call for any problems that may develop before her next visit here, which will be in 3 months. Jean Skow C    01/07/2013

## 2013-01-07 NOTE — Telephone Encounter (Signed)
, °

## 2013-01-08 NOTE — Addendum Note (Signed)
Addended by: Billey Co on: 01/08/2013 05:39 PM   Modules accepted: Orders

## 2013-01-11 ENCOUNTER — Ambulatory Visit (INDEPENDENT_AMBULATORY_CARE_PROVIDER_SITE_OTHER): Payer: Self-pay | Admitting: Surgery

## 2013-01-11 ENCOUNTER — Encounter (INDEPENDENT_AMBULATORY_CARE_PROVIDER_SITE_OTHER): Payer: Self-pay | Admitting: Surgery

## 2013-01-11 VITALS — BP 148/76 | HR 68 | Temp 97.4°F | Resp 16 | Ht 67.0 in | Wt 198.0 lb

## 2013-01-11 DIAGNOSIS — Z853 Personal history of malignant neoplasm of breast: Secondary | ICD-10-CM

## 2013-01-11 DIAGNOSIS — L732 Hidradenitis suppurativa: Secondary | ICD-10-CM

## 2013-01-11 NOTE — Progress Notes (Signed)
Subjective:     Patient ID: Erica [name], female   DOB: Jul 08, 1975, 37 y.o.   MRN: [id number]  HPI The patient returns to clinic today. She is here for followup of her breast cancer and hidradenitis of her axilla. She has  a little drainage from the axilla. Currently, she is having no symptoms. She denies any problems with her breast. She does have some left arm swelling.   Review of Systems  Constitutional: Negative.   HENT: Negative.   Eyes: Negative.   Respiratory: Negative.   Cardiovascular: Negative.   Gastrointestinal: Negative.        Objective:   Physical Exam  Constitutional: She is oriented to person, place, and time. She appears well-developed and well-nourished.  HENT:  Head: Normocephalic and atraumatic.  Nose: Nose normal.  Cardiovascular: Normal rate, regular rhythm and normal heart sounds.   Pulmonary/Chest: Effort normal and breath sounds normal.       Left breast smaller than right breast. Post radiation changes of  the left breast noted. No mass. Axilla  normal. Right breast shows no mass lesion. Right axilla is normal. There is a mild chronic hidradenitis in both axilla.  No drainage or redness.  Multiple sinus tracts noted.   Musculoskeletal: Normal range of motion.  Neurological: She is alert and oriented to person, place, and time.  Psychiatric: She has a normal mood and affect.       Assessment:     Bilateral hidradenitis axilla History of left breast cancer    Plan:     Return in 12 months. Hidradenitis is stable.  Follow for now.

## 2013-01-11 NOTE — Patient Instructions (Signed)
Return 1 year. 

## 2013-01-12 ENCOUNTER — Ambulatory Visit (INDEPENDENT_AMBULATORY_CARE_PROVIDER_SITE_OTHER): Payer: Self-pay | Admitting: Internal Medicine

## 2013-01-12 ENCOUNTER — Encounter: Payer: Self-pay | Admitting: Internal Medicine

## 2013-01-12 VITALS — BP 130/84 | HR 80 | Temp 98.4°F | Ht 65.0 in | Wt 209.0 lb

## 2013-01-12 DIAGNOSIS — J45909 Unspecified asthma, uncomplicated: Secondary | ICD-10-CM

## 2013-01-12 NOTE — Patient Instructions (Addendum)
Increase dulera to 200 Take 2 puffs first thing in am and then another 2 puffs about 12 hours later.   I will contact Dr Rhea Belton regarding your night time spells that don't resolve  even on the prednisone  Prednisone 10 mg take  4 each am x 2 days,   2 each am x 2 days,  1 each am x 2 days and stop  Please schedule a follow up office visit in 4 weeks, sooner if needed with all meds and inhalers in hand ? Why not use reglan at hs Be sure using mucinex dm and tramadol correctly

## 2013-01-12 NOTE — Progress Notes (Signed)
PFT done today. 

## 2013-01-12 NOTE — Progress Notes (Signed)
Subjective:    Patient ID: Erica [name], female    DOB: 1975-09-17   MRN: [id number]    Brief patient profile:  60 yobf with asthma all her life much worse since around 07/2010 referred by Dr Darnelle Catalan to pulmonary clinic in Sept 2012   HPI 12/18/2010 Initial pulmonary office eval on acei and   advair cc dtc asthma x 4 months but baseline = excess saba use "even when better" at least twice daily despite daily advair, worse at hs in fact  used saba 3 x last night.  On chemo /RT for breast ca but last rx was Jan 2012 well before asthma worse.  To er multiple times , no better with singular added x 3 months. rec Stop Lisinopril Dulera 200 Take 2 puffs first thing in am and then another 2 puffs about 12 hours later.  Work on inhaler technique  Nexium 40 mg   Take 30-60 min before first meal of the day and Pepcid 20 mg one bedtime until return Only use your albuterol as a rescue medication to be used if you can't catch your breath by resting or doing a relaxed purse lip breathing pattern. The less you use it, the better it will work when you need it.  Prednisone 10 mg take  4 each am x 2 days,   2 each am x 2 days,  1 each am x2days and stop    01/03/2011 f/u ov/[name] cc much better off ace and advair but still needing saba twice daily on avg. No  Purulent sputum. rec no change in rx/ rule of 2's reviewed       09/11/2012 f/u ov/[name] "did fine" p prev ov then resumed advair 250  And now on normodyne Chief Complaint  Patient presents with  . Acute Visit    Pt c/o increased cough and SOBfor the past 2 wks. Cough is occ prod with minimal clear sputum. Breathing is esp worse at night, waking up 2-3 times at night to use neb.   >>Prednisone 10 mg take  4 each am x 2 days,   2 each am x 2 days,  1 each am x 2 days and stop  Stop advair and normodyne bystolic 20 mg every 12 hours (2 x 10 every 12 hours)  dulera 100 Take 2 puffs first thing in am and then another 2 puffs about 12 hours later    09/25/2012 Med calendar  Returns for 1 week follow up for med review  We reviewed all her meds .Data base down for med calendars.  She depends on samples , rx to [facility] assistance program and generics -as she does not have insurance.  We discussed drug assistance program for [facility].  Will need to change bystolic to bisoprolol for cost issues.  Last visit labs revealed neg d dimer and bnp. CXR w/out acute process.  She feels about the same still gets DOE . Occasional cough and wheezing on/off.  No fever or discolored mucus.  Lives part-time in [facility].  rec Stop Ipratropium Neb.  Continue on current regimen .  We will change Bystolic to Bisoprolol 10mg  Twice daily   We will get paperwork for Drug assistance program for [facility]    11/20/2012 f/u ov/[name] re asthma Chief Complaint  Patient presents with  . Asthma    Breathing has gotten worse since last OV. Pt reports increased wheezing and SOB and DOE. Pt  using rescue inhaler  cough is variably quite severe worse at  hs takes 2 tramadol to control, no excess mucus production. Barking quality. Breathing much better when not coughing, not much better with saba. Present 24/7 but better p the 2 tramadol at hs.  Coughs so hard hurts ant cp mostly center >no changes   12/07/12 Follow up and Med review  new med calendar - pt brought all meds with her today.   We reviewed all her meds and organized them into a patient medication calendar.  It appears she is taking her meds correctly however she depends on samples and drug assistance program for some meds.  She does complain over last 2 weeks of cough, congestion, sinus drainage and wheezing  No fever, chest pain or orthopnea.  OTC cold meds not working on congestion.  rec Zpack take as directed  Mucinex DM Twice daily   Prednisone taper over next week  Follow med calendar closely and bring to each visit.    01/12/2013 f/u ov/[name] re: dtc asthma Chief Complaint  Patient presents with  . Follow-up     PFT done today.   Even on prednisone still need albuterol hfa avg once at night and neb saba also w/in 2 h of saba hfa for noct cough/ wheeze but much less sob/wheeze during the day eg with walk then worse in 2 weeks despite dulera 100 2bid.    No obvious pattern in day to day or daytime variabilty or excessive/ purulent mucus or cp  overt sinus or hb symptoms. No unusual exp hx or h/o childhood pna  or knowledge of premature birth.  Sleeping ok without nocturnal  or early am exacerbation  of respiratory  c/o's or need for noct saba. Also denies any obvious fluctuation of symptoms with weather or environmental changes or other aggravating or alleviating factors except as outlined above   Current Medications, Allergies, Complete Past Medical History, Past Surgical History, Family History, and Social History were reviewed in [employer] record.  ROS  The following are not active complaints unless bolded sore throat, dysphagia, dental problems, itching, sneezing,  nasal congestion or excess/ purulent secretions, ear ache,   fever, chills, sweats, unintended wt loss, pleuritic or exertional cp, hemoptysis,  orthopnea pnd or leg swelling, presyncope, palpitations, heartburn, abdominal pain, anorexia, nausea, vomiting, diarrhea  or change in bowel or urinary habits, change in stools or urine, dysuria,hematuria,  rash, arthralgias, visual complaints, headache, numbness weakness or ataxia or problems with walking or coordination,  change in mood/affect or memory.               Objective:   Physical Exam  Obese bf nad   Wt 213  12/18/10 > 01/03/2011  214 > 01/31/2011  206 >  09/11/2012 209 >208 6/19 > 210 09/25/2012 > 11/20/2012 211  >208 12/07/12> 01/12/2013 209  HEENT: nl dentition, turbinates, and orophanx. Nl external ear canals without cough reflex   NECK :  without JVD/Nodes/TM/ nl carotid upstrokes bilaterally   LUNGS: lungs clear   CV:  RRR  no s3 or murmur or  increase in P2, no edema   ABD:  soft and nontender with nl excursion in the supine position. No bruits or organomegaly, bowel sounds nl  MS:  warm without deformities, calf tenderness, cyanosis or clubbing      cxr 09/17/12    No acute abnormality      Assessment & Plan:

## 2013-01-13 NOTE — Assessment & Plan Note (Signed)
-   PFT's 01/31/2011 FEV1  1.76 (60% ) ratio 68 and no better p B2,  DLCO 70% -med review 09/25/2012 > Dulera drug assistance  paperwork given    - HFA 90% 11/20/2012 .  -Med calendar 12/07/12  - PFT's 01/12/2013  1.73 (65%)  Ratio 73 and no better p B2, DLCO 86% and completely nl x fef25-75 down to 43% with 38% better p B2  Her "prednisone responsiveness" only relates to the day time doe/"wheeze" and not to the noct spells which are either noct GERD or small airways related so will rechallenge with short course pred and increase dulera to 200 bid but if not effective consider qvar 80 bid and meantime strongly rec wt loss and consideration for higher dose of reglan at hs     Each maintenance medication was reviewed in detail including most importantly the difference between maintenance and as needed and under what circumstances the prns are to be used. This was done in the context of a medication calendar review which provided the patient with a user-friendly unambiguous mechanism for medication administration and reconciliation and provides an action plan for all active problems. It is critical that this be shown to every doctor  for modification during the office visit if necessary so the patient can use it as a working document.

## 2013-01-14 ENCOUNTER — Telehealth: Payer: Self-pay | Admitting: *Deleted

## 2013-01-14 DIAGNOSIS — K3184 Gastroparesis: Secondary | ICD-10-CM

## 2013-01-14 MED ORDER — METOCLOPRAMIDE HCL 10 MG PO TABS: 10.0000 mg | ORAL_TABLET | Freq: Three times a day (TID) | ORAL | Status: DC

## 2013-01-14 NOTE — Telephone Encounter (Signed)
Worth a try. Thanks for the note. JMP Trampas Stettner Please call Erica [name] and let her know that I want her to use a dose of Reglan at bedtime each night. We are trying to improve her gastroparesis symptoms, but also nocturnal coughing. We recently started reglan after a clinic visit, and her follow-up should be forthcoming. JMP ----- Message ----- From: Nyoka Cowden, MD Sent: 01/13/2013 8:06 AM To: Beverley Fiedler, MD Her noct spells of "asthma" don't respond   Spoke with pt and she is aware of Dr Thurston Hole recommendation. New script ordered; pt stated understanding.

## 2013-01-22 ENCOUNTER — Encounter: Payer: Self-pay | Admitting: Internal Medicine

## 2013-01-25 ENCOUNTER — Ambulatory Visit: Payer: Medicaid - Out of State | Attending: Oncology | Admitting: Physical Therapy

## 2013-01-25 ENCOUNTER — Encounter: Payer: Self-pay | Admitting: Cardiology

## 2013-01-25 ENCOUNTER — Ambulatory Visit (INDEPENDENT_AMBULATORY_CARE_PROVIDER_SITE_OTHER): Payer: Self-pay | Admitting: Cardiology

## 2013-01-25 ENCOUNTER — Encounter (INDEPENDENT_AMBULATORY_CARE_PROVIDER_SITE_OTHER): Payer: Self-pay

## 2013-01-25 VITALS — BP 148/74 | HR 98 | Ht 66.0 in | Wt 212.4 lb

## 2013-01-25 DIAGNOSIS — Z9221 Personal history of antineoplastic chemotherapy: Secondary | ICD-10-CM

## 2013-01-25 DIAGNOSIS — R079 Chest pain, unspecified: Secondary | ICD-10-CM

## 2013-01-25 NOTE — Patient Instructions (Signed)
Your physician has requested that you have an echocardiogram. Echocardiography is a painless test that uses sound waves to create images of your heart. It provides your doctor with information about the size and shape of your heart and how well your heart's chambers and valves are working. This procedure takes approximately one hour. There are no restrictions for this procedure.   Your physician has requested that you have a lexiscan myoview. For further information please visit [email or web address] Please follow instruction sheet, as given.  Your physician recommends that you schedule a follow-up appointment in: after tests.

## 2013-01-25 NOTE — Progress Notes (Signed)
Patient ID: JAUNICE MIRZA, female   DOB: 29-Apr-1975, 37 y.o.   MRN: [id number]    Patient Name: Erica [name] Date of Encounter: 01/25/2013  Primary Care Provider:  Sanda Linger, MD Primary Cardiologist:  Tobias Alexander, H  Patient Profile  Chest pain  Problem List   Past Medical History  Diagnosis Date  . Breast cancer 2010  . Asthma   . Hypertension   . GERD (gastroesophageal reflux disease)   . Impaired glucose tolerance 04/13/2011  . Anemia, unspecified 04/13/2011  . Asthma 09/27/2010  . Bloating 03/18/2011  . 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 Lymphadenitis, chronic     restricted LEFT extremity   Past Surgical History  Procedure Laterality Date  . Hand surgery  2012    right; tendon release  . Breast lumpectomy  09/2008    left  . Carpal tunnel release  2011    left hand  . Tubal ligation  05/11/2011    Procedure: POST PARTUM TUBAL LIGATION;  Surgeon: Scheryl Darter, MD;  Location: [facility];  Service: Gynecology;  Laterality: Bilateral;  Induced for HTN  . Lymph node dissection  2010    left breast; 2 wks after breast lumpectomy    Allergies  Allergies  Allergen Reactions  . Aspirin Shortness Of Breath and Palpitations    Pt can take ibuprofen without reaction  . Penicillins Shortness Of Breath and Palpitations    HPI  37 year old female with h/o asthma, obesity, hypertension, who is coming with complaints of chest pain. This pleasant young female has a history of locally advanced left breast carcinoma diagnosed in 2010 and treated with neoadjuvant chemotherapy with docetaxel and cyclophosphamide as well as paclitaxel. This was followed by radiation therapy which was completed in 2011. The patient complains of chest pain it is not exertional, it is locally located in the retrosternal and left epigastric region. It has been happening anytime of the day, and can last for minutes. There is no clear aggravating or alleviating factor. The patient states that ever  since her chemotherapy she gets short of breath with minimal activity and states that she will be able to walk a block.  Home Medications  Prior to Admission medications   Medication Sig Start Date End Date Taking? Authorizing Provider  albuterol (PROVENTIL HFA;VENTOLIN HFA) 108 (90 BASE) MCG/ACT inhaler Inhale 2 puffs into the lungs every 4 (four) hours as needed for wheezing or shortness of breath (((PLAN A))).     Historical Provider, MD  albuterol (PROVENTIL) (2.5 MG/3ML) 0.083% nebulizer solution Take 2.5 mg by nebulization every 4 (four) hours as needed for wheezing or shortness of breath (((PLAN B))). For shortness of breath. 05/07/12   Amy Allegra Grana, PA-C  amLODipine-olmesartan (AZOR) 5-40 MG per tablet Take 1 tablet by mouth daily. 08/05/12   [name], MD  cloNIDine (CATAPRES) 0.2 MG tablet Take 1 tablet (0.2 mg total) by mouth 2 (two) times daily. 08/05/12   [name], MD  hyoscyamine (LEVSIN SL) 0.125 MG SL tablet Place 0.125 mg under the tongue every 8 (eight) hours as needed for cramping. 05/19/12   Beverley Fiedler, MD  LORazepam (ATIVAN) 0.5 MG tablet Take 1 tablet (0.5 mg total) by mouth at bedtime as needed for anxiety. 01/07/13   Lowella Dell, MD  metoCLOPramide (REGLAN) 10 MG tablet Take 1 tablet (10 mg total) by mouth 4 (four) times daily -  with meals and at bedtime. 01/14/13   Carie Caddy  Pyrtle, MD  mometasone-formoterol (DULERA) 100-5 MCG/ACT AERO Inhale 2 puffs into the lungs 2 (two) times daily. 05/07/12   Amy Allegra Grana, PA-C  montelukast (SINGULAIR) 10 MG tablet Take 1 tablet (10 mg total) by mouth at bedtime. 12/31/11   Lowella Dell, MD  nebivolol (BYSTOLIC) 10 MG tablet Take 10 mg by mouth at bedtime.    Historical Provider, MD  pantoprazole (PROTONIX) 40 MG tablet Take 1 tablet (40 mg total) by mouth daily. 01/07/13   Lowella Dell, MD  potassium chloride SA (K-DUR,KLOR-CON) 20 MEQ tablet Take 20 mEq by mouth 2 (two) times daily. 06/04/12   [name], MD  sucralfate  (CARAFATE) 1 G tablet Take 1 tablet (1 g total) by mouth 4 (four) times daily. 03/07/12   Toy Baker, MD  traMADol Janean Sark) 50 MG tablet 1-2 tabs by mouth every 4 hours as needed for cough or pain 09/10/12   Amy Allegra Grana, PA-C  triamterene-hydrochlorothiazide (MAXZIDE-25) 37.5-25 MG per tablet Take 1 tablet by mouth every morning.    Historical Provider, MD    Family History  Family History  Problem Relation Age of Onset  . Diabetes Maternal Grandmother   . Heart disease Maternal Grandmother   . Cancer Neg Hx   . Alcohol abuse Neg Hx   . Early death Neg Hx   . Hyperlipidemia Neg Hx   . Hypertension Neg Hx   . Kidney disease Neg Hx   . Stroke Neg Hx   . Colon cancer Neg Hx     Social History  History   Social History  . Marital Status: Single    Spouse Name: N/A    Number of Children: 3  . Years of Education: N/A   Occupational History  .     Social History Main Topics  . Smoking status: Never Smoker   . Smokeless tobacco: Never Used  . Alcohol Use: No     Comment: occasionally  . Drug Use: No  . Sexual Activity: Yes    Birth Control/ Protection: Surgical   Other Topics Concern  . Not on file   Social History Narrative  . No narrative on file     Review of Systems General:  No chills, fever, night sweats or weight changes.  Cardiovascular:  No chest pain, dyspnea on exertion, edema, orthopnea, palpitations, paroxysmal nocturnal dyspnea. Dermatological: No rash, lesions/masses Respiratory: No cough, dyspnea Urologic: No hematuria, dysuria Abdominal:   No nausea, vomiting, diarrhea, bright red blood per rectum, melena, or hematemesis Neurologic:  No visual changes, wkns, changes in mental status. All other systems reviewed and are otherwise negative except as noted above.  Physical Exam BP 148/74, HR 98 BPM Last menstrual period 12/11/2012.  General: Pleasant, NAD Psych: Normal affect. Neuro: Alert and oriented X 3. Moves all extremities  spontaneously. HEENT: Normal  Neck: Supple without bruits or JVD. Lungs:  Resp regular and unlabored, CTA. Heart: RRR no s3, s4, or murmurs. Abdomen: Soft, non-tender, non-distended, BS + x 4.  Extremities: No clubbing, cyanosis or edema. DP/PT/Radials 2+ and equal bilaterally.  Lipid Panel     Component Value Date/Time   CHOL 176 03/02/2012 1534   TRIG 154.0* 03/02/2012 1534   HDL 47.70 03/02/2012 1534   CHOLHDL 4 03/02/2012 1534   VLDL 30.8 03/02/2012 1534   LDLCALC 98 03/02/2012 1534    Accessory Clinical Findings  ECG - normal sinus rhythm, 100 beats per minute, normal EKG.   Assessment & Plan  37 year old  female with prior medical history of hyperlipidemia hypertension and breast carcinoma with chemotherapy that can potentially cause heart block, heart failure and angina.  We will order an echocardiogram to evaluate heart anatomy and function. We will also order a LexiScan nuclear stress test as the patient states that she is severely deconditioned and she wouldn't be able to walk. On today's visit her blood pressure was elevated, if it elevated and to follow up visit we will adjust her medication. Will order lipid panel.  Follow up in 1 month  Tobias Alexander, Rexene Edison, MD 01/25/2013, 2:54 PM

## 2013-01-26 ENCOUNTER — Ambulatory Visit (INDEPENDENT_AMBULATORY_CARE_PROVIDER_SITE_OTHER): Payer: Self-pay | Admitting: Internal Medicine

## 2013-01-26 ENCOUNTER — Ambulatory Visit (HOSPITAL_COMMUNITY)
Admission: RE | Admit: 2013-01-26 | Discharge: 2013-01-26 | Disposition: A | Discharge status: Home / Self Care | Payer: Medicaid - Out of State | Source: Ambulatory Visit | Attending: Obstetrics and Gynecology | Admitting: Obstetrics and Gynecology

## 2013-01-26 ENCOUNTER — Ambulatory Visit: Payer: Self-pay

## 2013-01-26 ENCOUNTER — Encounter: Payer: Self-pay | Admitting: Internal Medicine

## 2013-01-26 ENCOUNTER — Encounter (HOSPITAL_COMMUNITY): Payer: Self-pay

## 2013-01-26 ENCOUNTER — Telehealth: Payer: Self-pay

## 2013-01-26 VITALS — BP 122/84 | Temp 99.7°F | Ht 66.0 in | Wt 214.6 lb

## 2013-01-26 VITALS — BP 138/72 | HR 88 | Ht 66.0 in | Wt 213.0 lb

## 2013-01-26 DIAGNOSIS — K3184 Gastroparesis: Secondary | ICD-10-CM

## 2013-01-26 DIAGNOSIS — Z0000 Encounter for general adult medical examination without abnormal findings: Secondary | ICD-10-CM

## 2013-01-26 DIAGNOSIS — Z1239 Encounter for other screening for malignant neoplasm of breast: Secondary | ICD-10-CM

## 2013-01-26 MED ORDER — METOCLOPRAMIDE HCL 10 MG PO TABS: 10.0000 mg | ORAL_TABLET | Freq: Three times a day (TID) | ORAL | Status: DC

## 2013-01-26 NOTE — Patient Instructions (Signed)
Taught [name] how to perform BSE and gave educational materials to take home. Patient did not need a Pap smear today due to last Pap smear was 04/10/2011. Let her know BCCCP will cover Pap smears every 3 years unless has a history of abnormal Pap smears. Referred patient to the [facility] for diagnostic mammogram per recommendation. Appointment scheduled for Today, January 26, 2013 at 1530. Patient aware of appointment and will be there. [name] verbalized understanding.  Alvester Eads, Kathaleen Maser, RN 2:20 PM

## 2013-01-26 NOTE — Telephone Encounter (Signed)
LMTCB

## 2013-01-26 NOTE — Progress Notes (Signed)
Subjective:    Patient ID: Erica [name], female    DOB: 07-Jul-1975, 37 y.o.   MRN: [id number]  HPI Stephaniemarie is a 37 yo female with PMH of breast cancer status post lumpectomy with chemotherapy/XRT felt currently to be in remission, asthma, hypertension, GERD, gastric ulcers, and gastroparesis who is seen in followup after last being seen on 01/04/2013. At her last visit she was started on metoclopramide therapy for gastroparesis which was diagnosed on recent endoscopy. EGD on 12/10/2012 showed a moderate amount retained food residue in the stomach Despite an overght fast.  Her previously seen gastric ulcers had healed. Prior to initiation of metoclopramide therapy she was having upper abdominal pain, most prominent in the left upper quadrant associated with nausea and occasional vomiting. She started Reglan 10 mg with meals and at bedtime. This has significantly improved her abdominal symptoms. She reports her symptoms are 70% better at this point. She reports for the first time in a very long time she even "woke up hungry".  She has had resolution of her nausea. She still occasionally feels the left upper quadrant discomfort but this is also significantly less frequent and less severe. She is taking pantoprazole once daily. She was referred to cardiology for evaluation of chest pain and was seen and has a pending echocardiogram and nuclear stress test. Her chest pain has not progressed or worsened in any way. Now she asks how to help her "lose weight".  Review of Systems As per history of present illness, otherwise negative  Current Medications, Allergies, Past Medical History, Past Surgical History, Family History and Social History were reviewed in [employer] record.    Objective:   Physical Exam BP 138/72  Pulse 88  Ht 5\' 6"  (1.676 m)  Wt 213 lb (96.616 kg)  BMI 34.4 kg/m2  LMP 12/11/2012 Constitutional: Well-developed and well-nourished. No distress. HEENT:  Normocephalic and atraumatic.  No scleral icterus. Cardiovascular: Normal rate, regular rhythm and intact distal pulses.  Pulmonary/chest: Effort normal and breath sounds normal. No wheezing, rales or rhonchi. Abdominal: Soft, nontender, nondistended. Bowel sounds active throughout. Extremities: no clubbing, cyanosis, or edema Neurological: Alert and oriented to person place and time. Skin: Skin is warm and dry. No rashes noted. Psychiatric: Normal mood and affect. Behavior is normal.     Assessment & Plan:  37 yo female with PMH of breast cancer status post lumpectomy with chemotherapy/XRT felt currently to be in remission, asthma, hypertension, GERD, gastric ulcers, and gastroparesis who is seen in followup after last being seen on 01/04/2013.  1.  Gastroparesis/LUQ pain/Nausea -- dramatic improvement with metoclopramide therapy. She is very happy with the improvement. We have discussed again today however like to use metoclopramide at the lowest effective dose and for the shortest amount of time necessary. For now she will continue with Reglan 10 mg before meals and at bedtime. Over the next month I would like her to try 5 mg before meals to see if she gets the same positive effect. This would allow her to dose reduce. She also should continue to follow the gastroparesis diet.  If a motility agent is necessary long-term, I would prefer domperidone. I would like her to complete her cardiac workup before any decision is made on change in therapy. I will see her back in 8-12 weeks to reassess her symptoms. Hopefully by this point she will have been able to reduce her dose of metoclopramide on a daily basis. She understands and is happy with the  current plan.  2.  GERD -- I will reduce her pantoprazole to 40 mg once daily. Previously she was taking twice daily for gastric ulcers but these have resolved. She is not having significant heartburn at present.

## 2013-01-26 NOTE — Patient Instructions (Signed)
Continue taking Reglan, reduce your dose to 1/2 a tablet before meals and 1 tablet at bedtime.   Take protonix daily, 1 tablet.  adhere to a gastroparesis diet  Follow up in 8-10 weeks                                               We are excited to introduce MyChart, a new best-in-class service that provides you online access to important information in your electronic medical record. We want to make it easier for you to view your health information - all in one secure location - when and where you need it. We expect MyChart will enhance the quality of care and service we provide.  When you register for MyChart, you can:    View your test results.    Request appointments and receive appointment reminders via email.    Request medication renewals.    View your medical history, allergies, medications and immunizations.    Communicate with your physician's office through a password-protected site.    Conveniently print information such as your medication lists.  To find out if MyChart is right for you, please talk to a member of our clinical staff today. We will gladly answer your questions about this free health and wellness tool.  If you are age 43 or older and want a member of your family to have access to your record, you must provide written consent by completing a proxy form available at our office. Please speak to our clinical staff about guidelines regarding accounts for patients younger than age 38.  As you activate your MyChart account and need any technical assistance, please call the MyChart technical support line at [phone]-CHART [phone]) or email your question to [email or web address]. If you email your question(s), please include your name, a return phone number and the best time to reach you.  If you have non-urgent health-related questions, you can send a message to our office through MyChart at [email or web address]. If you have a medical emergency, call  911.  Thank you for using MyChart as your new health and wellness resource!   MyChart licensed from [employer],  4540-9811. Patents Pending.

## 2013-01-26 NOTE — Telephone Encounter (Signed)
Erica [name],  Could you schedule this patient for a lipid panel?  Thank you,  Katarina   LMTCB.

## 2013-01-26 NOTE — Progress Notes (Signed)
Patient referred to [facility] for a diagnostic mammogram per recommendation of a left breast lumpectomy due to breast cancer in 2010.  Pap Smear:    Pap smear not completed today. Last Pap smear was 04/10/2011 and normal. Per patient has no history of an abnormal Pap smear. Last Pap smear result is in EPIC.  Physical exam: Breasts Right breast larger than left breast due to history of left breast lumpectomy in 2010. No skin abnormalities bilateral breasts. No nipple retraction bilateral breasts. No nipple discharge bilateral breasts. No lymphadenopathy. No lumps palpated bilateral breasts. Complaints of tenderness under right areola on exam. Referred patient to the [facility] for diagnostic mammogram per recommendation. Appointment scheduled for Today, January 26, 2013 at 1530.  Pelvic/Bimanual No Pap smear completed today since last Pap smear was 04/10/2011. Pap smear not indicated per BCCCP guidelines.

## 2013-01-28 ENCOUNTER — Ambulatory Visit: Payer: Medicaid - Out of State | Admitting: Physical Therapy

## 2013-01-29 NOTE — Telephone Encounter (Signed)
**Note De-identified  Obfuscation** LMTCB

## 2013-02-01 NOTE — Telephone Encounter (Signed)
New message ° ° °Patient is returning your call. °

## 2013-02-02 NOTE — Telephone Encounter (Signed)
**Note De-identified  Obfuscation** LMTCB

## 2013-02-02 NOTE — Telephone Encounter (Signed)
LMTCB

## 2013-02-03 NOTE — Telephone Encounter (Signed)
Pt states that she will come to office tomorrow or Friday to have Lipids drawn. Pt is advised not to eat or drink after midnight the night before lab is to be drawn, she verbalized understanding.

## 2013-02-03 NOTE — Telephone Encounter (Signed)
**Note De-identified  Obfuscation** LMTCB

## 2013-02-04 ENCOUNTER — Other Ambulatory Visit: Payer: Medicaid - Out of State

## 2013-02-05 ENCOUNTER — Other Ambulatory Visit (HOSPITAL_COMMUNITY): Payer: Self-pay

## 2013-02-05 ENCOUNTER — Encounter (HOSPITAL_COMMUNITY): Payer: Self-pay | Admitting: Emergency Medicine

## 2013-02-05 ENCOUNTER — Emergency Department (HOSPITAL_COMMUNITY): Payer: Self-pay

## 2013-02-05 ENCOUNTER — Ambulatory Visit
Admission: RE | Admit: 2013-02-05 | Discharge: 2013-02-05 | Disposition: A | Discharge status: Home / Self Care | Payer: No Typology Code available for payment source | Source: Ambulatory Visit | Attending: Physician Assistant | Admitting: Physician Assistant

## 2013-02-05 ENCOUNTER — Emergency Department (HOSPITAL_COMMUNITY)
Admission: EM | Admit: 2013-02-05 | Discharge: 2013-02-05 | Disposition: A | Discharge status: Home / Self Care | Payer: Self-pay | Attending: Emergency Medicine | Admitting: Emergency Medicine

## 2013-02-05 DIAGNOSIS — IMO0002 Reserved for concepts with insufficient information to code with codable children: Secondary | ICD-10-CM | POA: Insufficient documentation

## 2013-02-05 DIAGNOSIS — Z880 Allergy status to penicillin: Secondary | ICD-10-CM | POA: Insufficient documentation

## 2013-02-05 DIAGNOSIS — Z79899 Other long term (current) drug therapy: Secondary | ICD-10-CM | POA: Insufficient documentation

## 2013-02-05 DIAGNOSIS — Z853 Personal history of malignant neoplasm of breast: Secondary | ICD-10-CM

## 2013-02-05 DIAGNOSIS — G932 Benign intracranial hypertension: Secondary | ICD-10-CM | POA: Insufficient documentation

## 2013-02-05 DIAGNOSIS — K219 Gastro-esophageal reflux disease without esophagitis: Secondary | ICD-10-CM | POA: Insufficient documentation

## 2013-02-05 DIAGNOSIS — I10 Essential (primary) hypertension: Secondary | ICD-10-CM

## 2013-02-05 DIAGNOSIS — R110 Nausea: Secondary | ICD-10-CM | POA: Insufficient documentation

## 2013-02-05 DIAGNOSIS — Z8669 Personal history of other diseases of the nervous system and sense organs: Secondary | ICD-10-CM | POA: Insufficient documentation

## 2013-02-05 DIAGNOSIS — Z862 Personal history of diseases of the blood and blood-forming organs and certain disorders involving the immune mechanism: Secondary | ICD-10-CM | POA: Insufficient documentation

## 2013-02-05 DIAGNOSIS — R000 Tachycardia, unspecified: Secondary | ICD-10-CM | POA: Insufficient documentation

## 2013-02-05 DIAGNOSIS — J45909 Unspecified asthma, uncomplicated: Secondary | ICD-10-CM | POA: Insufficient documentation

## 2013-02-05 LAB — BASIC METABOLIC PANEL
BUN: 11 mg/dL (ref 6–23)
CO2: 20 mEq/L (ref 19–32)
Calcium: 9.6 mg/dL (ref 8.4–10.5)
Chloride: 100 mEq/L (ref 96–112)
Creatinine, Ser: 0.94 mg/dL (ref 0.50–1.10)
GFR calc non Af Amer: 75 mL/min — ABNORMAL LOW (ref >90)
Glucose, Bld: 89 mg/dL (ref 70–99)

## 2013-02-05 LAB — CBC WITH DIFFERENTIAL/PLATELET
Basophils Absolute: 0 10*3/uL (ref 0.0–0.1)
Eosinophils Absolute: 0.1 10*3/uL (ref 0.0–0.7)
Eosinophils Relative: 2 % (ref 0–5)
HCT: 38.8 % (ref 36.0–46.0)
Hemoglobin: 13.7 g/dL (ref 12.0–15.0)
Lymphocytes Relative: 27 % (ref 12–46)
Lymphs Abs: 1.8 10*3/uL (ref 0.7–4.0)
MCV: 88 fL (ref 78.0–100.0)
Monocytes Absolute: 0.6 10*3/uL (ref 0.1–1.0)
Monocytes Relative: 9 % (ref 3–12)
Neutro Abs: 4 10*3/uL (ref 1.7–7.7)
RBC: 4.41 MIL/uL (ref 3.87–5.11)
RDW: 13.4 % (ref 11.5–15.5)
WBC: 6.4 10*3/uL (ref 4.0–10.5)

## 2013-02-05 MED ORDER — ACETAZOLAMIDE 250 MG PO TABS: 500.0000 mg | ORAL_TABLET | Freq: Two times a day (BID) | ORAL | Status: DC

## 2013-02-05 MED ORDER — SODIUM CHLORIDE 0.9 % IV SOLN
INTRAVENOUS | Status: DC
  Administered 2013-02-05: 20 mL/h via INTRAVENOUS

## 2013-02-05 MED ORDER — LABETALOL HCL 5 MG/ML IV SOLN
20.0000 mg | Freq: Once | INTRAVENOUS | Status: AC
  Administered 2013-02-05: 20 mg via INTRAVENOUS
  Filled 2013-02-05: qty 4

## 2013-02-05 NOTE — ED Notes (Signed)
Patient transported to CT 

## 2013-02-05 NOTE — ED Notes (Addendum)
Pt returned from LP.  Instructed to lay flat for 4 hours.

## 2013-02-05 NOTE — ED Notes (Signed)
Pt in IR 

## 2013-02-05 NOTE — ED Notes (Signed)
Pt sent from her PCP for eval with HTN, HA x1 week and possible increased ICP. Pt reports having a "stroke in my R retina", 1 year ago. Pt reports nausea, but denies emesis, diarrhea. Pt is A&O and in NAD

## 2013-02-05 NOTE — ED Provider Notes (Signed)
CSN: [id number]     Arrival date & time 02/05/13  1317 History   First MD Initiated Contact with Patient 02/05/13 1330     Chief Complaint  Patient presents with  . Headache  . Hypertension   (Consider location/radiation/quality/duration/timing/severity/associated sxs/prior Treatment) Patient is a 37 y.o. female presenting with headaches and hypertension. The history is provided by the patient.  Headache Hypertension Associated symptoms include headaches.   patient here complaining of a two-week history of frontal headache. Nausea without vomiting. 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 She did not have any fever or neck pain. No chills. No photophobia. Patient had a therapeutic LP in the past and was taking Diamox but was taken off recently. Denies any history of head,. No visual loss. Neurology was consult by the ophthalmologist who also recommended that the patient have a therapeutic lumbar puncture. Patient was also noted to have been hypertensive in the office today with a systolic blood pressure of 200 with a diastolic over 100. No medications given for this and the patient was transported here. She does have a prior history of hypertension and has been using her medications properly.  Past Medical History  Diagnosis Date  . Breast cancer 2010  . Asthma   . Hypertension   . GERD (gastroesophageal reflux disease)   . Impaired glucose tolerance 04/13/2011  . Anemia, unspecified 04/13/2011  . Asthma 09/27/2010  . Bloating 03/18/2011  . 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 Lymphadenitis, chronic     restricted LEFT extremity   Past Surgical History  Procedure Laterality Date  . Hand surgery  2012    right; tendon release  . Breast lumpectomy  09/2008    left  . Carpal tunnel release  2011    left hand  . Tubal ligation  05/11/2011    Procedure: POST PARTUM TUBAL LIGATION;  Surgeon: Scheryl Darter, MD;  Location: [facility];  Service:  Gynecology;  Laterality: Bilateral;  Induced for HTN  . Lymph node dissection  2010    left breast; 2 wks after breast lumpectomy   Family History  Problem Relation Age of Onset  . Diabetes Maternal Grandmother   . Heart disease Maternal Grandmother   . Hypertension Maternal Grandmother   . Cancer Neg Hx   . Alcohol abuse Neg Hx   . Early death Neg Hx   . Hyperlipidemia Neg Hx   . Kidney disease Neg Hx   . Stroke Neg Hx   . Colon cancer Neg Hx    History  Substance Use Topics  . Smoking status: Never Smoker   . Smokeless tobacco: Never Used  . Alcohol Use: No     Comment: occasionally   OB History   Grav Para Term Preterm Abortions TAB SAB Ect Mult Living   5 4 4  1  1   4      Review of Systems  Neurological: Positive for headaches.  All other systems reviewed and are negative.    Allergies  Aspirin and Penicillins  Home Medications   Current Outpatient Rx  Name  Route  Sig  Dispense  Refill  . acetaminophen (TYLENOL) 500 MG tablet   Oral   Take 2,000 mg by mouth every 6 (six) hours as needed for mild pain.         Marland Kitchen albuterol (PROVENTIL HFA;VENTOLIN HFA) 108 (90 BASE) MCG/ACT inhaler   Inhalation   Inhale 2 puffs into the lungs every  4 (four) hours as needed for wheezing or shortness of breath (((PLAN A))).          Marland Kitchen albuterol (PROVENTIL) (2.5 MG/3ML) 0.083% nebulizer solution   Nebulization   Take 2.5 mg by nebulization every 4 (four) hours as needed for wheezing or shortness of breath (((PLAN B))). For shortness of breath.         Marland Kitchen amLODipine-olmesartan (AZOR) 5-40 MG per tablet   Oral   Take 1 tablet by mouth daily.   90 tablet   1   . cloNIDine (CATAPRES) 0.2 MG tablet   Oral   Take 1 tablet (0.2 mg total) by mouth 2 (two) times daily.   180 tablet   1   . hyoscyamine (LEVSIN SL) 0.125 MG SL tablet   Sublingual   Place 0.125 mg under the tongue every 8 (eight) hours as needed for cramping.         Marland Kitchen LORazepam (ATIVAN) 0.5 MG  tablet   Oral   Take 1 tablet (0.5 mg total) by mouth at bedtime as needed for anxiety.   30 tablet   3   . metoCLOPramide (REGLAN) 10 MG tablet   Oral   Take 1 tablet (10 mg total) by mouth 4 (four) times daily -  with meals and at bedtime.   120 tablet   3     This script is show a change in dose; a bedtime do ...   . mometasone-formoterol (DULERA) 100-5 MCG/ACT AERO   Inhalation   Inhale 2 puffs into the lungs 2 (two) times daily.   13 g   5   . montelukast (SINGULAIR) 10 MG tablet   Oral   Take 1 tablet (10 mg total) by mouth at bedtime.   30 tablet   12   . nebivolol (BYSTOLIC) 10 MG tablet   Oral   Take 10 mg by mouth at bedtime.         . pantoprazole (PROTONIX) 40 MG tablet   Oral   Take 1 tablet (40 mg total) by mouth daily.   60 tablet   3   . potassium chloride SA (K-DUR,KLOR-CON) 20 MEQ tablet   Oral   Take 20 mEq by mouth 2 (two) times daily.         . sucralfate (CARAFATE) 1 G tablet   Oral   Take 1 tablet (1 g total) by mouth 4 (four) times daily.   30 tablet   0   . traMADol (ULTRAM) 50 MG tablet      1-2 tabs by mouth every 4 hours as needed for cough or pain         . triamterene-hydrochlorothiazide (MAXZIDE-25) 37.5-25 MG per tablet   Oral   Take 1 tablet by mouth every morning.          BP 147/89  Pulse 93  Temp(Src) 97.8 F (36.6 C) (Oral)  Resp 24  SpO2 96%  LMP 12/11/2012  Breastfeeding? No Physical Exam  Nursing note and vitals reviewed. Constitutional: She is oriented to person, place, and time. She appears well-developed and well-nourished.  Non-toxic appearance. No distress.  HENT:  Head: Normocephalic and atraumatic.  Eyes: Conjunctivae, EOM and lids are normal. Pupils are equal, round, and reactive to light.  Neck: Normal range of motion. Neck supple. No tracheal deviation present. No mass present.  Cardiovascular: Regular rhythm and normal heart sounds.  Tachycardia present.  Exam reveals no gallop.   No  murmur heard. Pulmonary/Chest: Effort  normal and breath sounds normal. No stridor. No respiratory distress. She has no decreased breath sounds. She has no wheezes. She has no rhonchi. She has no rales.  Abdominal: Soft. Normal appearance and bowel sounds are normal. She exhibits no distension. There is no tenderness. There is no rebound and no CVA tenderness.  Musculoskeletal: Normal range of motion. She exhibits no edema and no tenderness.  Neurological: She is alert and oriented to person, place, and time. She has normal strength. No cranial nerve deficit or sensory deficit. GCS eye subscore is 4. GCS verbal subscore is 5. GCS motor subscore is 6.  Skin: Skin is warm and dry. No abrasion and no rash noted.  Psychiatric: She has a normal mood and affect. Her speech is normal and behavior is normal.    ED Course  Procedures (including critical care time) Labs Review Labs Reviewed  BASIC METABOLIC PANEL - Abnormal; Notable for the following:    Potassium 3.4 (*)    GFR calc non Af Amer 75 (*)    GFR calc Af Amer 87 (*)    All other components within normal limits  CBC WITH DIFFERENTIAL   Imaging Review Ct Head Wo Contrast  02/05/2013   CLINICAL DATA:  Hypertension. Headache for 1 week. Nausea. History of breast cancer.  EXAM: CT HEAD WITHOUT CONTRAST  TECHNIQUE: Contiguous axial images were obtained from the base of the skull through the vertex without intravenous contrast.  COMPARISON:  Multiple priors  FINDINGS: No mass lesion. No midline shift. No acute hemorrhage or hematoma. No extra-axial fluid collections. No evidence of acute infarction. Calvarium is intact. The visualized paranasal sinuses and mastoid air cells are clear. Orbital soft tissues are unremarkable.  IMPRESSION: No acute intracranial abnormality.   Electronically Signed   By: Jerene Dilling M.D.   On: 02/05/2013 14:29   Mm Digital Diagnostic Bilat  02/05/2013   CLINICAL DATA:  The patient presents for 3 year followup  postlumpectomy of the left breast with chemotherapy in 2010. Radiation therapy in 2011.  EXAM: DIGITAL DIAGNOSTIC  BILATERAL MAMMOGRAM WITH CAD  With tangential spot compression view over the lumpectomy site  COMPARISON:  01/06/2012 and prior  ACR Breast Density Category b: There are scattered areas of fibroglandular density.  FINDINGS: Stable postlumpectomy changes are identified in the upper-outer portion of the left breast. No new focal mass lesions, non surgical areas of architectural distortion or abnormal calcification are identified on either side.  Mammographic images were processed with CAD.  IMPRESSION: Stable postlumpectomy changes in the left breast. No mammographic features worrisome for malignancy.  RECOMMENDATION: Annual postlumpectomy diagnostic followup is recommended in 01/2014.  I have discussed the findings and recommendations with the patient. Results were also provided in writing at the conclusion of the visit. If applicable, a reminder letter will be sent to the patient regarding the next appointment.  BI-RADS CATEGORY  2: Benign Finding(s)   Electronically Signed   By: Leda Gauze M.D.   On: 02/05/2013 10:37    EKG Interpretation   None       MDM  No diagnosis found. Patient given labetalol 20 mg IV push and blood pressure has improved to a systolic of 150 diastolic of 80. Her headache is slightly improved. I counseled that neurology for a therapeutic lumbar puncture and they have referred me to IR. I spoke with Dr. Grace Isaac and he'll perform the lumbar puncture. Patient will be brought back over here afterwards for repeat blood pressure and Dr. Oletta Lamas  to followup   Toy Baker, MD 02/05/13 [id number]

## 2013-02-05 NOTE — ED Provider Notes (Signed)
Opening pressure on LP by radiology was 18.  I spoke to Dr. Cyril Mourning who recommends that pt resume her Diamox.  Will recommend to pt and she can otherwise follow up with her PCP, Dr. [name].  BP elevation may be other cause contributing to HA's.    Gavin Pound. Jacorey Donaway, MD 02/05/13 1740

## 2013-02-05 NOTE — Progress Notes (Signed)
[facility] provided pt with a list of primary care resources.  °

## 2013-02-09 ENCOUNTER — Ambulatory Visit (INDEPENDENT_AMBULATORY_CARE_PROVIDER_SITE_OTHER): Payer: Self-pay | Admitting: Internal Medicine

## 2013-02-09 ENCOUNTER — Encounter: Payer: Self-pay | Admitting: Internal Medicine

## 2013-02-09 VITALS — BP 144/90 | HR 108 | Temp 99.0°F | Ht 65.0 in | Wt 209.6 lb

## 2013-02-09 DIAGNOSIS — J45909 Unspecified asthma, uncomplicated: Secondary | ICD-10-CM

## 2013-02-09 DIAGNOSIS — Z23 Encounter for immunization: Secondary | ICD-10-CM

## 2013-02-09 NOTE — Progress Notes (Signed)
Subjective:    Patient ID: Erica [name], female    DOB: November 21, 1975   MRN: [id number]    Brief patient profile:  16 yobf with asthma all her life much worse since around 07/2010 referred by Dr Darnelle Catalan to pulmonary clinic in Sept 2012   HPI 12/18/2010 Initial pulmonary office eval on acei and   advair cc dtc asthma x 4 months but baseline = excess saba use "even when better" at least twice daily despite daily advair, worse at hs in fact  used saba 3 x last night.  On chemo /RT for breast ca but last rx was Jan 2012 well before asthma worse.  To er multiple times , no better with singular added x 3 months. rec Stop Lisinopril Dulera 200 Take 2 puffs first thing in am and then another 2 puffs about 12 hours later.  Work on inhaler technique  Nexium 40 mg   Take 30-60 min before first meal of the day and Pepcid 20 mg one bedtime until return Only use your albuterol as a rescue medication to be used if you can't catch your breath by resting or doing a relaxed purse lip breathing pattern. The less you use it, the better it will work when you need it.  Prednisone 10 mg take  4 each am x 2 days,   2 each am x 2 days,  1 each am x2days and stop    01/03/2011 f/u ov/Erica [name] cc much better off ace and advair but still needing saba twice daily on avg. No  Purulent sputum. rec no change in rx/ rule of 2's reviewed       09/11/2012 f/u ov/Erica [name] "did fine" p prev ov then resumed advair 250  And now on normodyne Chief Complaint  Patient presents with  . Acute Visit    Pt c/o increased cough and SOBfor the past 2 wks. Cough is occ prod with minimal clear sputum. Breathing is esp worse at night, waking up 2-3 times at night to use neb.   >>Prednisone 10 mg take  4 each am x 2 days,   2 each am x 2 days,  1 each am x 2 days and stop  Stop advair and normodyne bystolic 20 mg every 12 hours (2 x 10 every 12 hours)  dulera 100 Take 2 puffs first thing in am and then another 2 puffs about 12 hours later    09/25/2012 Med calendar  Returns for 1 week follow up for med review  We reviewed all her meds .Data base down for med calendars.  She depends on samples , rx to [place] assistance program and generics -as she does not have insurance.  We discussed drug assistance program for [facility].  Will need to change bystolic to bisoprolol for cost issues.  Last visit labs revealed neg d dimer and bnp. CXR w/out acute process.  She feels about the same still gets DOE . Occasional cough and wheezing on/off.  No fever or discolored mucus.  Lives part-time in [employer]  rec Stop Ipratropium Neb.  Continue on current regimen .  We will change Bystolic to Bisoprolol 10mg  Twice daily   We will get paperwork for Drug assistance program for [facility]    11/20/2012 f/u ov/Erica [name] re asthma Chief Complaint  Patient presents with  . Asthma    Breathing has gotten worse since last OV. Pt reports increased wheezing and SOB and DOE. Pt  using rescue inhaler  cough is variably quite severe worse at  hs takes 2 tramadol to control, no excess mucus production. Barking quality. Breathing much better when not coughing, not much better with saba. Present 24/7 but better p the 2 tramadol at hs.  Coughs so hard hurts ant cp mostly center >no changes   12/07/12 Follow up and Med review  new med calendar - pt brought all meds with her today.   We reviewed all her meds and organized them into a patient medication calendar.  It appears she is taking her meds correctly however she depends on samples and drug assistance program for some meds.  She does complain over last 2 weeks of cough, congestion, sinus drainage and wheezing  No fever, chest pain or orthopnea.  OTC cold meds not working on congestion.  rec Zpack take as directed  Mucinex DM Twice daily   Prednisone taper over next week  Follow med calendar closely and bring to each visit.    01/12/2013 f/u ov/Erica [name] re: dtc asthma Chief Complaint  Patient presents with  . Follow-up     PFT done today.   Even on prednisone still need albuterol hfa avg once at night and neb saba also w/in 2 h of saba hfa for noct cough/ wheeze but much less sob/wheeze during the day eg with walk then worse in 2 weeks despite dulera 100 2bid.  rec Increase dulera to 200 Take 2 puffs first thing in am and then another 2 puffs about 12 hours later.  I will contact Dr Rhea Belton regarding your night time spells that don't resolve  even on the prednisone Prednisone 10 mg take  4 each am x 2 days,   2 each am x 2 days,  1 each am x 2 days and stop Please schedule a follow up office visit in 4 weeks, sooner if needed with all meds and inhalers in hand ? Why not use reglan at hs Be sure using mucinex dm and tramadol correctly    02/09/2013 f/u ov/Erica [name] re: dtc asthma, did not bring med calendar Chief Complaint  Patient presents with  . Follow-up    Pt states that her breathing is doing well.  She does use her rescue inhaler approx twice per day and has used neb x 3 in the past wk  Symptoms provoked by walking by around the block but this is an improvement over baseline  Nocturnal episodes sev hours p going to bed "gasping for breath" some better p started reglan at hs    No obvious pattern in day to day or daytime variabilty or excessive/ purulent mucus or cp  overt sinus or hb symptoms. No unusual exp hx or h/o childhood pna  or knowledge of premature birth.  Also denies any obvious fluctuation of symptoms with weather or environmental changes or other aggravating or alleviating factors except as outlined above   Current Medications, Allergies, Complete Past Medical History, Past Surgical History, Family History, and Social History were reviewed in [employer] record.  ROS  The following are not active complaints unless bolded sore throat, dysphagia, dental problems, itching, sneezing,  nasal congestion or excess/ purulent secretions, ear ache,   fever, chills, sweats,  unintended wt loss, pleuritic or exertional cp, hemoptysis,  orthopnea pnd or leg swelling, presyncope, palpitations, heartburn, abdominal pain, anorexia, nausea, vomiting, diarrhea  or change in bowel or urinary habits, change in stools or urine, dysuria,hematuria,  rash, arthralgias, visual complaints, headache, numbness weakness or ataxia or problems with walking or coordination,  change in mood/affect  or memory.               Objective:   Physical Exam  Obese bf nad   Wt 213  12/18/10 > 01/03/2011  214 > 01/31/2011  206 >  09/11/2012 209 >208 6/19 > 210 09/25/2012 > 11/20/2012 211  >208 12/07/12> 01/12/2013 209 > 02/09/2013 210   HEENT: nl dentition, turbinates, and orophanx. Nl external ear canals without cough reflex   NECK :  without JVD/Nodes/TM/ nl carotid upstrokes bilaterally   LUNGS: lungs clear   CV:  RRR  no s3 or murmur or increase in P2, no edema   ABD:  soft and nontender with nl excursion in the supine position. No bruits or organomegaly, bowel sounds nl  MS:  warm without deformities, calf tenderness, cyanosis or clubbing      cxr 09/17/12    No acute abnormality      Assessment & Plan:

## 2013-02-09 NOTE — Patient Instructions (Addendum)
Dulera 200 Take 2 puffs first thing in am and then another 2 puffs about 12 hours later.   Only use your albuterol (ventolin) as a rescue medication to be used if you can't catch your breath by resting or doing a relaxed purse lip breathing pattern.  - The less you use it, the better it will work when you need it. - Ok to use up to 2 puff every 4 hours if you must but call for immediate appointment if use goes up over your usual need - Don't leave home without it !!  (think of it like your spare tire for your car)   Only use your nebulizer if your ventolin fails to relieve your breathing.  Stop singulair to see what difference if any this makes   GERD (REFLUX)  is an extremely common cause of respiratory symptoms, many times with no significant heartburn at all.    It can be treated with medication, but also with lifestyle changes including avoidance of late meals, excessive alcohol, smoking cessation, and avoid fatty foods, chocolate, peppermint, colas, red wine, and acidic juices such as orange juice.  NO MINT OR MENTHOL PRODUCTS SO NO COUGH DROPS  USE SUGARLESS CANDY INSTEAD (jolley ranchers or Stover's)  NO OIL BASED VITAMINS - use powdered substitutes.   Please schedule a follow up office visit in 6 weeks, call sooner if needed  Late add: first do f/u with Tammy NP to verify meds then  If ex intol continues consider cpst with spirometry before and after .

## 2013-02-10 NOTE — Assessment & Plan Note (Addendum)
-   PFT's 01/31/2011 FEV1  1.76 (60% ) ratio 68 and no better p B2,  DLCO 70% -med review 09/25/2012 > Dulera drug assistance  paperwork given    - HFA 90% 11/20/2012 .  -Med calendar 12/07/12  - PFT's 01/12/2013  1.73 (65%)  Ratio 73 and no better p B2, DLCO 86% - trial off singulair 02/08/13 >>>  Not convinced there is active asthma at this point but at least she's using a lot less albuterol than baseline which gives her a good Pan B and C to avoid unnecessary trips to ER   rec work harder on diet/ maint med reconciliation by use of the checklist formatted med calendar we reviewed with her today line by line    Each maintenance medication was reviewed in detail including most importantly the difference between maintenance and as needed and under what circumstances the prns are to be used. This was done in the context of a medication calendar review which provided the patient with a user-friendly unambiguous mechanism for medication administration and reconciliation and provides an action plan for all active problems. It is critical that this be shown to every doctor  for modification during the office visit if necessary so the patient can use it as a working document.   If ex intol continues consider cpst with spirometry before and after

## 2013-02-11 ENCOUNTER — Telehealth: Payer: Self-pay | Admitting: *Deleted

## 2013-02-11 NOTE — Telephone Encounter (Signed)
Left message for pt to return my call so I can give her the code for the [facility].

## 2013-02-12 ENCOUNTER — Encounter: Payer: Self-pay | Admitting: Cardiology

## 2013-02-12 ENCOUNTER — Ambulatory Visit (HOSPITAL_COMMUNITY): Payer: Medicaid - Out of State | Attending: Cardiology | Admitting: Radiology

## 2013-02-12 VITALS — BP 176/109 | HR 95 | Ht 68.0 in | Wt 206.0 lb

## 2013-02-12 DIAGNOSIS — R0602 Shortness of breath: Secondary | ICD-10-CM | POA: Insufficient documentation

## 2013-02-12 DIAGNOSIS — R079 Chest pain, unspecified: Secondary | ICD-10-CM

## 2013-02-12 DIAGNOSIS — R42 Dizziness and giddiness: Secondary | ICD-10-CM | POA: Insufficient documentation

## 2013-02-12 DIAGNOSIS — R0609 Other forms of dyspnea: Secondary | ICD-10-CM | POA: Insufficient documentation

## 2013-02-12 DIAGNOSIS — R0989 Other specified symptoms and signs involving the circulatory and respiratory systems: Secondary | ICD-10-CM | POA: Insufficient documentation

## 2013-02-12 DIAGNOSIS — Z79899 Other long term (current) drug therapy: Secondary | ICD-10-CM | POA: Insufficient documentation

## 2013-02-12 DIAGNOSIS — I10 Essential (primary) hypertension: Secondary | ICD-10-CM | POA: Insufficient documentation

## 2013-02-12 DIAGNOSIS — R51 Headache: Secondary | ICD-10-CM | POA: Insufficient documentation

## 2013-02-12 DIAGNOSIS — Z9221 Personal history of antineoplastic chemotherapy: Secondary | ICD-10-CM

## 2013-02-12 MED ORDER — REGADENOSON 0.4 MG/5ML IV SOLN
0.4000 mg | Freq: Once | INTRAVENOUS | Status: AC
  Administered 2013-02-12: 0.4 mg via INTRAVENOUS

## 2013-02-12 MED ORDER — TECHNETIUM TC 99M SESTAMIBI GENERIC - CARDIOLITE
11.0000 | Freq: Once | INTRAVENOUS | Status: AC | PRN
  Administered 2013-02-12: 11 via INTRAVENOUS

## 2013-02-12 MED ORDER — TECHNETIUM TC 99M SESTAMIBI GENERIC - CARDIOLITE
33.0000 | Freq: Once | INTRAVENOUS | Status: AC | PRN
  Administered 2013-02-12: 33 via INTRAVENOUS

## 2013-02-12 MED ORDER — AMINOPHYLLINE 25 MG/ML IV SOLN
75.0000 mg | Freq: Once | INTRAVENOUS | Status: AC
  Administered 2013-02-12: 75 mg via INTRAVENOUS

## 2013-02-12 NOTE — Progress Notes (Signed)
[facility] SITE 3 NUCLEAR MED [address] [place], [place] [address] [phone]    Cardiology Nuclear Med Study  Erica [name] is a 37 y.o. female     MRN : [id number]     DOB: 04-15-1975  Procedure Date: 02/12/2013  Nuclear Med Background Indication for Stress Test:  Evaluation for Ischemia History:  Echo 2010 EF 50%, No known CAD Cardiac Risk Factors: Hypertension  Symptoms:  Chest Pain (last chest discomfort was today but not at this time), DOE and Light-Headedness   Nuclear Pre-Procedure Caffeine/Decaff Intake:  None > 12hrs NPO After: 11:00pm   Lungs:  clear O2 Sat: 96% on room air. IV 0.9% NS with Angio Cath:  22g  IV Site: R Antecubital x 1, tolerated well IV Started by:  Irean Hong, RN  Chest Size (in):  38 Cup Size: D  Height: 5\' 8"  (1.727 m)  Weight:  206 lb (93.441 kg)  BMI:  Body mass index is 31.33 kg/(m^2). Tech Comments:  Bystolic last night    Nuclear Med Study 1 or 2 day study: 1 day  Stress Test Type:  Lexiscan  Reading MD: Marca Ancona, MD  Order Authorizing Provider:  Tobias Alexander, MD  Resting Radionuclide: Technetium 18m Sestamibi  Resting Radionuclide Dose: 11 mCi   Stress Radionuclide:  Technetium 11m Sestamibi  Stress Radionuclide Dose: 33.0 mCi           Stress Protocol Rest HR: 95 Stress HR: 141  Rest BP: 176/109 Stress BP: 161/111  Exercise Time (min): n/a METS: n/a   Predicted Max HR: 181 bpm % Max HR: 77.9 bpm Rate Pressure Product: 84132   Dose of Adenosine (mg):  n/a Dose of Lexiscan: 0.4 mg  Dose of Atropine (mg): n/a Dose of Dobutamine: n/a mcg/kg/min (at max HR)  Stress Test Technologist: Nelson Chimes, BS-ES  Nuclear Technologist:  Dario Guardian, CNMT     Rest Procedure:  Myocardial perfusion imaging was performed at rest 45 minutes following the intravenous administration of Technetium 82m Sestamibi. Rest ECG: NSR - Normal EKG  Stress Procedure:  The patient received IV Lexiscan 0.4 mg over 15-seconds.   Technetium 43m Sestamibi injected at 30-seconds.  Quantitative spect images were obtained after a 45 minute delay.  During the infusion of Lexiscan, patient complained of severe headache, chest hurting and SOB.  Four minutes after the infusion of Lexiscan, Dario Guardian, CNMT administered 50mg  aminophylline.  Symptoms began to subside slightly but began to complain of nausea.  Another 25 mg aminophylline was given approximately three minutes later and she began to feel better.  Stress ECG: No significant change from baseline ECG  QPS Raw Data Images:  Normal; no motion artifact; normal heart/lung ratio. Stress Images:  Normal homogeneous uptake in all areas of the myocardium. Rest Images:  Normal homogeneous uptake in all areas of the myocardium. Subtraction (SDS):  There is no evidence of scar or ischemia. Transient Ischemic Dilatation (Normal <1.22):  1.01 Lung/Heart Ratio (Normal <0.45):  0.20  Quantitative Gated Spect Images QGS EDV:  106 ml QGS ESV:  48 ml  Impression Exercise Capacity:  Lexiscan with no exercise. BP Response:  Hypertensive blood pressure response. Clinical Symptoms:  Chest pain, headache.  ECG Impression:  No significant ST segment change suggestive of ischemia. Comparison with Prior Nuclear Study: No images to compare  Overall Impression:  Normal stress nuclear study.  LV Ejection Fraction: 54%.  LV Wall Motion:  NL LV Function; NL Wall Motion  [employer]  02/12/2013          

## 2013-02-16 ENCOUNTER — Encounter: Payer: Self-pay | Admitting: Cardiology

## 2013-02-16 ENCOUNTER — Ambulatory Visit (INDEPENDENT_AMBULATORY_CARE_PROVIDER_SITE_OTHER): Payer: Medicaid - Out of State | Admitting: Cardiology

## 2013-02-16 VITALS — BP 146/72 | HR 63 | Ht 68.0 in | Wt 208.0 lb

## 2013-02-16 DIAGNOSIS — R079 Chest pain, unspecified: Secondary | ICD-10-CM

## 2013-02-16 LAB — COMPREHENSIVE METABOLIC PANEL
ALT: 40 U/L — ABNORMAL HIGH (ref 0–35)
AST: 35 U/L (ref 0–37)
Albumin: 3.9 g/dL (ref 3.5–5.2)
Alkaline Phosphatase: 46 U/L (ref 39–117)
BUN: 10 mg/dL (ref 6–23)
CO2: 27 mEq/L (ref 19–32)
Calcium: 9 mg/dL (ref 8.4–10.5)
Chloride: 103 mEq/L (ref 96–112)
Creatinine, Ser: 1 mg/dL (ref 0.4–1.2)
GFR: 80.13 mL/min (ref >60.00)
Glucose, Bld: 93 mg/dL (ref 70–99)
Potassium: 4 mEq/L (ref 3.5–5.1)
Sodium: 137 mEq/L (ref 135–145)
Total Bilirubin: 0.6 mg/dL (ref 0.3–1.2)
Total Protein: 7.9 g/dL (ref 6.0–8.3)

## 2013-02-16 LAB — LIPID PANEL
Cholesterol: 196 mg/dL (ref 0–200)
HDL: 47.3 mg/dL (ref >39.00)
LDL Cholesterol: 129 mg/dL — ABNORMAL HIGH (ref 0–99)
Total CHOL/HDL Ratio: 4
Triglycerides: 97 mg/dL (ref 0.0–149.0)
VLDL: 19.4 mg/dL (ref 0.0–40.0)

## 2013-02-16 MED ORDER — AMLODIPINE BESYLATE 10 MG PO TABS: 10.0000 mg | ORAL_TABLET | Freq: Every day | ORAL | Status: DC

## 2013-02-16 MED ORDER — OLMESARTAN MEDOXOMIL 40 MG PO TABS: 40.0000 mg | ORAL_TABLET | Freq: Every day | ORAL | Status: DC

## 2013-02-16 NOTE — Patient Instructions (Addendum)
Lab Work today    Stop [employer] Benicar 40 mg daily   Start Amlodipine 10 mg daily   Your physician has requested that you have an echocardiogram. Echocardiography is a painless test that uses sound waves to create images of your heart. It provides your doctor with information about the size and shape of your heart and how well your heart's chambers and valves are working. This procedure takes approximately one hour. There are no restrictions for this procedure.  Your physician recommends that you schedule a follow-up appointment after echo

## 2013-02-16 NOTE — Progress Notes (Signed)
Patient ID: Erica [name], female   DOB: 08-06-1975, 37 y.o.   MRN: [id number]  Patient Name: Erica [name] Date of Encounter: 02/16/2013  Primary Care Provider:  Sanda Linger, MD Primary Cardiologist:  Tobias Alexander, H  Patient Profile  Chest pain  Problem List   Past Medical History  Diagnosis Date  . Breast cancer 2010  . Asthma   . Hypertension   . GERD (gastroesophageal reflux disease)   . Impaired glucose tolerance 04/13/2011  . Anemia, unspecified 04/13/2011  . Asthma 09/27/2010  . Bloating 03/18/2011  . 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 Lymphadenitis, chronic     restricted LEFT extremity   Past Surgical History  Procedure Laterality Date  . Hand surgery  2012    right; tendon release  . Breast lumpectomy  09/2008    left  . Carpal tunnel release  2011    left hand  . Tubal ligation  05/11/2011    Procedure: POST PARTUM TUBAL LIGATION;  Surgeon: Scheryl Darter, MD;  Location: [facility];  Service: Gynecology;  Laterality: Bilateral;  Induced for HTN  . Lymph node dissection  2010    left breast; 2 wks after breast lumpectomy    Allergies  Allergies  Allergen Reactions  . Aspirin Shortness Of Breath and Palpitations    Pt can take ibuprofen without reaction  . Penicillins Shortness Of Breath and Palpitations    HPI  37 year old female with h/o asthma, obesity, hypertension, who is coming with complaints of chest pain. This pleasant young female has a history of locally advanced left breast carcinoma diagnosed in 2010 and treated with neoadjuvant chemotherapy with docetaxel and cyclophosphamide as well as paclitaxel. This was followed by radiation therapy which was completed in 2011. The patient complains of chest pain it is not exertional, it is locally located in the retrosternal and left epigastric region. It has been happening anytime of the day, and can last for minutes. There is no clear aggravating or alleviating factor. The patient states that ever  since her chemotherapy she gets short of breath with minimal activity and states that she will be able to walk a block.  Follow up after 1 month, no change in her symptoms.  Home Medications  Prior to Admission medications   Medication Sig Start Date End Date Taking? Authorizing Provider  albuterol (PROVENTIL HFA;VENTOLIN HFA) 108 (90 BASE) MCG/ACT inhaler Inhale 2 puffs into the lungs every 4 (four) hours as needed for wheezing or shortness of breath (((PLAN A))).     Historical Provider, MD  albuterol (PROVENTIL) (2.5 MG/3ML) 0.083% nebulizer solution Take 2.5 mg by nebulization every 4 (four) hours as needed for wheezing or shortness of breath (((PLAN B))). For shortness of breath. 05/07/12   Amy Allegra Grana, PA-C  amLODipine-olmesartan (AZOR) 5-40 MG per tablet Take 1 tablet by mouth daily. 08/05/12   [name], MD  cloNIDine (CATAPRES) 0.2 MG tablet Take 1 tablet (0.2 mg total) by mouth 2 (two) times daily. 08/05/12   [name], MD  hyoscyamine (LEVSIN SL) 0.125 MG SL tablet Place 0.125 mg under the tongue every 8 (eight) hours as needed for cramping. 05/19/12   Beverley Fiedler, MD  LORazepam (ATIVAN) 0.5 MG tablet Take 1 tablet (0.5 mg total) by mouth at bedtime as needed for anxiety. 01/07/13   Lowella Dell, MD  metoCLOPramide (REGLAN) 10 MG tablet Take 1 tablet (10 mg total) by mouth 4 (four) times daily -  with meals and at bedtime. 01/14/13   Beverley Fiedler, MD  mometasone-formoterol (DULERA) 100-5 MCG/ACT AERO Inhale 2 puffs into the lungs 2 (two) times daily. 05/07/12   Amy Allegra Grana, PA-C  montelukast (SINGULAIR) 10 MG tablet Take 1 tablet (10 mg total) by mouth at bedtime. 12/31/11   Lowella Dell, MD  nebivolol (BYSTOLIC) 10 MG tablet Take 10 mg by mouth at bedtime.    Historical Provider, MD  pantoprazole (PROTONIX) 40 MG tablet Take 1 tablet (40 mg total) by mouth daily. 01/07/13   Lowella Dell, MD  potassium chloride SA (K-DUR,KLOR-CON) 20 MEQ tablet Take 20 mEq by mouth 2 (two)  times daily. 06/04/12   [name], MD  sucralfate (CARAFATE) 1 G tablet Take 1 tablet (1 g total) by mouth 4 (four) times daily. 03/07/12   Toy Baker, MD  traMADol Janean Sark) 50 MG tablet 1-2 tabs by mouth every 4 hours as needed for cough or pain 09/10/12   Amy Allegra Grana, PA-C  triamterene-hydrochlorothiazide (MAXZIDE-25) 37.5-25 MG per tablet Take 1 tablet by mouth every morning.    Historical Provider, MD    Family History  Family History  Problem Relation Age of Onset  . Diabetes Maternal Grandmother   . Heart disease Maternal Grandmother   . Hypertension Maternal Grandmother   . Cancer Neg Hx   . Alcohol abuse Neg Hx   . Early death Neg Hx   . Hyperlipidemia Neg Hx   . Kidney disease Neg Hx   . Stroke Neg Hx   . Colon cancer Neg Hx     Social History  History   Social History  . Marital Status: Single    Spouse Name: N/A    Number of Children: 3  . Years of Education: N/A   Occupational History  .     Social History Main Topics  . Smoking status: Never Smoker   . Smokeless tobacco: Never Used  . Alcohol Use: No     Comment: occasionally  . Drug Use: No  . Sexual Activity: Yes    Birth Control/ Protection: Surgical   Other Topics Concern  . Not on file   Social History Narrative  . No narrative on file     Review of Systems General:  No chills, fever, night sweats or weight changes.  Cardiovascular:  No chest pain, dyspnea on exertion, edema, orthopnea, palpitations, paroxysmal nocturnal dyspnea. Dermatological: No rash, lesions/masses Respiratory: No cough, dyspnea Urologic: No hematuria, dysuria Abdominal:   No nausea, vomiting, diarrhea, bright red blood per rectum, melena, or hematemesis Neurologic:  No visual changes, wkns, changes in mental status. All other systems reviewed and are otherwise negative except as noted above.  Physical Exam Blood pressure 146/72, pulse 63, height 5\' 8"  (1.727 m), weight 208 lb (94.348 kg), last menstrual period  12/11/2012.  General: Pleasant, NAD Psych: Normal affect. Neuro: Alert and oriented X 3. Moves all extremities spontaneously. HEENT: Normal  Neck: Supple without bruits or JVD. Lungs:  Resp regular and unlabored, CTA. Heart: RRR no s3, s4, or murmurs. Abdomen: Soft, non-tender, non-distended, BS + x 4.  Extremities: No clubbing, cyanosis or edema. DP/PT/Radials 2+ and equal bilaterally.  Lipid Panel     Component Value Date/Time   CHOL 176 03/02/2012 1534   TRIG 154.0* 03/02/2012 1534   HDL 47.70 03/02/2012 1534   CHOLHDL 4 03/02/2012 1534   VLDL 30.8 03/02/2012 1534   LDLCALC 98 03/02/2012 1534   Nuclear stress test:  02/15/2013 Impression  Exercise Capacity: Lexiscan with no exercise.  BP Response: Hypertensive blood pressure response.  Clinical Symptoms: Chest pain, headache.  ECG Impression: No significant ST segment change suggestive of ischemia.  Comparison with Prior Nuclear Study: No images to compare  Overall Impression: Normal stress nuclear study.  LV Ejection Fraction: 54%. LV Wall Motion: NL LV Function; NL Wall Motion  Marca Ancona  02/12/2013  Accessory Clinical Findings  ECG - normal sinus rhythm, 100 beats per minute, normal EKG.   Assessment & Plan  37 year old female with prior medical history of hyperlipidemia, hypertension and breast carcinoma with chemotherapy that can potentially cause heart block, heart failure and angina.  1. DOE - A lexiscan nuclear stress test showed poor exercise capacity, LV EF 54% and no perfusion defect.  Echocardiogram is still pending.   2. HTN - BP elevated on 2 consecutive visits, we will increase amlodipine to 10 mg daily. Echo to assess LV systolic and diastolic function, filling pressures and LVH is tsill pending.  3. Lipid profile today  4. Hypokalemia - K 3.4 on 02/05/13 on 20 mEq BID, we will recheck today and call her  Follow up in 1 month  Tobias Alexander, Rexene Edison, MD 02/16/2013, 12:13 PM

## 2013-02-19 ENCOUNTER — Telehealth: Payer: Self-pay | Admitting: Internal Medicine

## 2013-02-19 DIAGNOSIS — J45909 Unspecified asthma, uncomplicated: Secondary | ICD-10-CM

## 2013-02-19 NOTE — Care Management Note (Signed)
    Page 1 of 1   02/18/2013     10:50:48 AM   CARE MANAGEMENT NOTE 02/18/2013  Patient:  Erica [name], Erica [name]   Account Number:  [id number]  Date Initiated:  02/18/2013  Documentation initiated by:  Caymen Dubray  Subjective/Objective Assessment:     Action/Plan:   Anticipated DC Date:     Anticipated DC Plan:           Choice offered to / List presented to:             Status of service:   Medicare Important Message given?   (If response is "NO", the following Medicare IM given date fields will be blank) Date Medicare IM given:   Date Additional Medicare IM given:    Discharge Disposition:    Per UR Regulation:    If discussed at Long Length of Stay Meetings, dates discussed:    Comments:  Received a call from a pharmacy in [place] regarding the prescription written on 02/05/13.  I spoke with the patient and informed her given the length of time it has been since she was seen in the ED she would need to see her primary care MD.

## 2013-02-19 NOTE — Telephone Encounter (Signed)
Pt aware referral placed for new machine

## 2013-03-01 ENCOUNTER — Telehealth: Payer: Self-pay | Admitting: Internal Medicine

## 2013-03-01 NOTE — Telephone Encounter (Signed)
I called home # for pt and it went straight to VM but not able to leave VM I called cell # and received message. wcb

## 2013-03-02 NOTE — Telephone Encounter (Signed)
I called mobile number and spoke with family member and advised [facility] is trying to get in contact with them. She states that they have called and LM and then they called back and was advised that the pt was not in the system and that they did not have any orders for her. I advised I will speak with Melissa and provide her with the contact number I reached them on and have her call them.   I spoke with Melissa and she states she will call them now. Carron Curie, CMA

## 2013-03-04 ENCOUNTER — Other Ambulatory Visit (HOSPITAL_COMMUNITY): Payer: Medicaid - Out of State

## 2013-03-09 ENCOUNTER — Ambulatory Visit: Payer: Medicaid - Out of State | Admitting: Cardiology

## 2013-03-11 ENCOUNTER — Telehealth: Payer: Self-pay | Admitting: Internal Medicine

## 2013-03-11 ENCOUNTER — Encounter: Payer: Self-pay | Admitting: Internal Medicine

## 2013-03-11 NOTE — Telephone Encounter (Signed)
Pt has appt with TP on 03/22/13 and wanted to keep this appt date/time. Pt aware to bring all meds with her to appt with TP.

## 2013-03-11 NOTE — Telephone Encounter (Signed)
Spoke With [facility]-- Pt is unable to begin nebs at this time d/t not having insurance. Pt is going to try and get the money together to buy the meds and supplies. Will contact [facility] back once able to pay for.   Will send to MW as Lorain Childes that patients treatment is being delayed.

## 2013-03-11 NOTE — Telephone Encounter (Signed)
Ideally should not ever need a neb in this setting - the hfa albuterol should do just fine as rescue and unless needing it every 4 hours or failing to work work I recommend the neb.  If really feels can't do without the neb need to get back together asap with all meds and med calendar in hand

## 2013-03-22 ENCOUNTER — Encounter (HOSPITAL_COMMUNITY): Payer: Self-pay | Admitting: Emergency Medicine

## 2013-03-22 ENCOUNTER — Emergency Department (HOSPITAL_COMMUNITY)
Admission: EM | Admit: 2013-03-22 | Discharge: 2013-03-22 | Disposition: A | Discharge status: Home / Self Care | Payer: Medicaid - Out of State | Attending: Emergency Medicine | Admitting: Emergency Medicine

## 2013-03-22 ENCOUNTER — Ambulatory Visit: Payer: Self-pay | Admitting: Internal Medicine

## 2013-03-22 ENCOUNTER — Ambulatory Visit: Payer: Self-pay | Admitting: Adult Health

## 2013-03-22 ENCOUNTER — Emergency Department (HOSPITAL_COMMUNITY): Payer: Medicaid - Out of State

## 2013-03-22 DIAGNOSIS — Z853 Personal history of malignant neoplasm of breast: Secondary | ICD-10-CM | POA: Insufficient documentation

## 2013-03-22 DIAGNOSIS — S93409A Sprain of unspecified ligament of unspecified ankle, initial encounter: Secondary | ICD-10-CM | POA: Insufficient documentation

## 2013-03-22 DIAGNOSIS — Z79899 Other long term (current) drug therapy: Secondary | ICD-10-CM | POA: Insufficient documentation

## 2013-03-22 DIAGNOSIS — I10 Essential (primary) hypertension: Secondary | ICD-10-CM | POA: Insufficient documentation

## 2013-03-22 DIAGNOSIS — S93601A Unspecified sprain of right foot, initial encounter: Secondary | ICD-10-CM

## 2013-03-22 DIAGNOSIS — R609 Edema, unspecified: Secondary | ICD-10-CM | POA: Insufficient documentation

## 2013-03-22 DIAGNOSIS — Z880 Allergy status to penicillin: Secondary | ICD-10-CM | POA: Insufficient documentation

## 2013-03-22 DIAGNOSIS — IMO0002 Reserved for concepts with insufficient information to code with codable children: Secondary | ICD-10-CM | POA: Insufficient documentation

## 2013-03-22 DIAGNOSIS — X500XXA Overexertion from strenuous movement or load, initial encounter: Secondary | ICD-10-CM | POA: Insufficient documentation

## 2013-03-22 DIAGNOSIS — Y9301 Activity, walking, marching and hiking: Secondary | ICD-10-CM | POA: Insufficient documentation

## 2013-03-22 DIAGNOSIS — J45909 Unspecified asthma, uncomplicated: Secondary | ICD-10-CM | POA: Insufficient documentation

## 2013-03-22 DIAGNOSIS — Y929 Unspecified place or not applicable: Secondary | ICD-10-CM | POA: Insufficient documentation

## 2013-03-22 DIAGNOSIS — K219 Gastro-esophageal reflux disease without esophagitis: Secondary | ICD-10-CM | POA: Insufficient documentation

## 2013-03-22 DIAGNOSIS — I881 Chronic lymphadenitis, except mesenteric: Secondary | ICD-10-CM | POA: Insufficient documentation

## 2013-03-22 MED ORDER — HYDROCODONE-ACETAMINOPHEN 5-325 MG PO TABS: 2.0000 | ORAL_TABLET | ORAL | Status: DC | PRN

## 2013-03-22 MED ORDER — IBUPROFEN 800 MG PO TABS
800.0000 mg | ORAL_TABLET | Freq: Once | ORAL | Status: AC
  Administered 2013-03-22: 800 mg via ORAL
  Filled 2013-03-22: qty 1

## 2013-03-22 NOTE — ED Provider Notes (Signed)
CSN: [id number]     Arrival date & time 03/22/13  0459 History   First MD Initiated Contact with Patient 03/22/13 0544     Chief Complaint  Patient presents with  . Foot Pain   (Consider location/radiation/quality/duration/timing/severity/associated sxs/prior Treatment) HPI Comments: 37  Yo female with right foot pain and swelling since walking last night, no pop or crack, pain with walking, no ankle issues in the past.  No other injuries.   Patient is a 37 y.o. female presenting with lower extremity pain. The history is provided by the patient.  Foot Pain This is a new problem. Pertinent negatives include no headaches.    Past Medical History  Diagnosis Date  . Breast cancer 2010  . Asthma   . Hypertension   . GERD (gastroesophageal reflux disease)   . Impaired glucose tolerance 04/13/2011  . Anemia, unspecified 04/13/2011  . Asthma 09/27/2010  . Bloating 03/18/2011  . 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 Lymphadenitis, chronic     restricted LEFT extremity   Past Surgical History  Procedure Laterality Date  . Hand surgery  2012    right; tendon release  . Breast lumpectomy  09/2008    left  . Carpal tunnel release  2011    left hand  . Tubal ligation  05/11/2011    Procedure: POST PARTUM TUBAL LIGATION;  Surgeon: Scheryl Darter, MD;  Location: [facility];  Service: Gynecology;  Laterality: Bilateral;  Induced for HTN  . Lymph node dissection  2010    left breast; 2 wks after breast lumpectomy   Family History  Problem Relation Age of Onset  . Diabetes Maternal Grandmother   . Heart disease Maternal Grandmother   . Hypertension Maternal Grandmother   . Cancer Neg Hx   . Alcohol abuse Neg Hx   . Early death Neg Hx   . Hyperlipidemia Neg Hx   . Kidney disease Neg Hx   . Stroke Neg Hx   . Colon cancer Neg Hx    History  Substance Use Topics  . Smoking status: Never Smoker   . Smokeless tobacco: Never Used  . Alcohol Use: No     Comment: occasionally   OB History   Grav Para Term Preterm Abortions TAB SAB Ect Mult Living   5 4 4  1  1   4      Review of Systems  Musculoskeletal: Positive for arthralgias and joint swelling.  Skin: Negative for wound.  Neurological: Negative for syncope and headaches.    Allergies  Aspirin and Penicillins  Home Medications   Current Outpatient Rx  Name  Route  Sig  Dispense  Refill  . acetaminophen (TYLENOL) 500 MG tablet   Oral   Take 2,000 mg by mouth every 6 (six) hours as needed for mild pain.         Marland Kitchen acetaZOLAMIDE (DIAMOX) 250 MG tablet   Oral   Take 2 tablets (500 mg total) by mouth 2 (two) times daily.   60 tablet   0   . albuterol (PROVENTIL HFA;VENTOLIN HFA) 108 (90 BASE) MCG/ACT inhaler   Inhalation   Inhale 2 puffs into the lungs every 4 (four) hours as needed for wheezing or shortness of breath (((PLAN A))).          Marland Kitchen albuterol (PROVENTIL) (2.5 MG/3ML) 0.083% nebulizer solution   Nebulization   Take 2.5 mg by nebulization every 4 (four) hours as needed for wheezing or shortness of breath (((PLAN B))). For shortness  of breath.         Marland Kitchen amLODipine (NORVASC) 10 MG tablet   Oral   Take 1 tablet (10 mg total) by mouth daily.   30 tablet   6   . cloNIDine (CATAPRES) 0.2 MG tablet   Oral   Take 1 tablet (0.2 mg total) by mouth 2 (two) times daily.   180 tablet   1   . hyoscyamine (LEVSIN SL) 0.125 MG SL tablet   Sublingual   Place 0.125 mg under the tongue every 8 (eight) hours as needed for cramping.         Marland Kitchen LORazepam (ATIVAN) 0.5 MG tablet   Oral   Take 1 tablet (0.5 mg total) by mouth at bedtime as needed for anxiety.   30 tablet   3   . metoCLOPramide (REGLAN) 10 MG tablet   Oral   Take 1 tablet (10 mg total) by mouth 4 (four) times daily -  with meals and at bedtime.   120 tablet   3     This script is show a change in dose; a bedtime do ...   . mometasone-formoterol (DULERA) 100-5 MCG/ACT AERO   Inhalation   Inhale 2 puffs into the lungs 2 (two) times  daily.   13 g   5   . nebivolol (BYSTOLIC) 10 MG tablet   Oral   Take 10 mg by mouth at bedtime.         Marland Kitchen olmesartan (BENICAR) 40 MG tablet   Oral   Take 1 tablet (40 mg total) by mouth daily.   30 tablet   6   . pantoprazole (PROTONIX) 40 MG tablet   Oral   Take 1 tablet (40 mg total) by mouth daily.   60 tablet   3   . potassium chloride SA (K-DUR,KLOR-CON) 20 MEQ tablet   Oral   Take 20 mEq by mouth 2 (two) times daily.         . sucralfate (CARAFATE) 1 G tablet   Oral   Take 1 tablet (1 g total) by mouth 4 (four) times daily.   30 tablet   0   . traMADol (ULTRAM) 50 MG tablet      1-2 tabs by mouth every 4 hours as needed for cough or pain         . triamterene-hydrochlorothiazide (MAXZIDE-25) 37.5-25 MG per tablet   Oral   Take 1 tablet by mouth every morning.          BP 169/94  Pulse 105  Temp(Src) 98.1 F (36.7 C) (Oral)  Resp 20  Wt 208 lb (94.348 kg)  SpO2 98%  LMP 01/20/2013  Breastfeeding? No Physical Exam  Nursing note and vitals reviewed. Constitutional: She appears well-developed and well-nourished. No distress.  HENT:  Head: Normocephalic and atraumatic.  Neck: Normal range of motion. Neck supple.  Cardiovascular: Regular rhythm.   Pulmonary/Chest: Effort normal.  Musculoskeletal: She exhibits edema and tenderness.  Right proximal foot tender lateral, no focal malleoli or knee pain, nv intact, mld swelling  Neurological: She is alert.    ED Course  Procedures (including critical care time) Labs Review Labs Reviewed - No data to display Imaging Review Dg Foot Complete Right  03/22/2013   CLINICAL DATA:  Injury to right foot, with pain about the fourth and fifth metatarsals.  EXAM: RIGHT FOOT COMPLETE - 3+ VIEW  COMPARISON:  Right foot radiographs performed 07/13/2009  FINDINGS: There is no evidence of fracture or  dislocation. The joint spaces are preserved. There is no evidence of talar subluxation; the subtalar joint is  unremarkable in appearance. Os peroneum and os naviculare fragments are seen.  No significant soft tissue abnormalities are seen.  IMPRESSION: 1. No evidence of fracture or dislocation. 2. Os peroneum and os naviculare noted.   Electronically Signed   By: Roanna Raider M.D.   On: 03/22/2013 06:24    EKG Interpretation   None       MDM   1. Foot sprain, right, initial encounter    Pain meds and xray Isolated injury  DC  ACe wrap        Enid Skeens, MD 03/22/13 [phone]

## 2013-03-22 NOTE — ED Notes (Signed)
Pt states while walking last night about 2100 twisted R foot causing pain. Swelling noted.

## 2013-03-30 ENCOUNTER — Encounter: Payer: Self-pay | Admitting: Cardiology

## 2013-04-06 ENCOUNTER — Ambulatory Visit (INDEPENDENT_AMBULATORY_CARE_PROVIDER_SITE_OTHER): Payer: Self-pay | Admitting: Adult Health

## 2013-04-06 ENCOUNTER — Encounter: Payer: Self-pay | Admitting: Adult Health

## 2013-04-06 VITALS — BP 144/82 | HR 101 | Temp 97.9°F | Ht 67.0 in | Wt 207.0 lb

## 2013-04-06 DIAGNOSIS — J45909 Unspecified asthma, uncomplicated: Secondary | ICD-10-CM

## 2013-04-06 MED ORDER — MOMETASONE FURO-FORMOTEROL FUM 200-5 MCG/ACT IN AERO
2.0000 | INHALATION_SPRAY | Freq: Two times a day (BID) | RESPIRATORY_TRACT | Status: DC
Start: 2013-04-06 — End: 2014-01-26

## 2013-04-06 MED ORDER — ALBUTEROL SULFATE HFA 108 (90 BASE) MCG/ACT IN AERS: 2.0000 | INHALATION_SPRAY | RESPIRATORY_TRACT | Status: DC | PRN

## 2013-04-06 MED ORDER — MONTELUKAST SODIUM 10 MG PO TABS: 10.0000 mg | ORAL_TABLET | Freq: Every day | ORAL | Status: DC

## 2013-04-06 MED ORDER — ALBUTEROL SULFATE (2.5 MG/3ML) 0.083% IN NEBU: 2.5000 mg | INHALATION_SOLUTION | RESPIRATORY_TRACT | Status: DC | PRN

## 2013-04-06 NOTE — Patient Instructions (Signed)
Follow med calendar closely and bring to each visit.  We are referring you for a CPSTwith spirometry before and after Follow up Dr. Melvyn Novas  In 4 weeks

## 2013-04-06 NOTE — Assessment & Plan Note (Addendum)
Poor control despite aggressive treatment.  Set up for CPST with spirometry pre/post  Patient's medications were reviewed today and patient education was given. Computerized medication calendar was adjusted/completed   Plan   Follow med calendar closely and bring to each visit.  We are referring you for a CPSTwith spirometry before and after Follow up Dr. Melvyn Novas  In 4 weeks

## 2013-04-06 NOTE — Progress Notes (Signed)
Subjective:    Patient ID: LONDIN ANTONE, female    DOB: [birth year]   MRN: [id number]    Brief patient profile:  6 yobf with asthma all her life much worse since around 07/2010 referred by Dr [name] to pulmonary clinic in Sept 2012 Hx of Breast Cancer 05/2008   HPI 12/18/2010 Initial pulmonary office eval on acei and   advair cc dtc asthma x 4 months but baseline = excess saba use "even when better" at least twice daily despite daily advair, worse at hs in fact  used saba 3 x last night.  On chemo /RT for breast ca but last rx was Jan 2012 well before asthma worse.  To er multiple times , no better with singular added x 3 months. rec Stop Lisinopril Dulera 200 Take 2 puffs first thing in am and then another 2 puffs about 12 hours later.  Work on inhaler technique  Nexium 40 mg   Take 30-60 min before first meal of the day and Pepcid 20 mg one bedtime until return Only use your albuterol as a rescue medication to be used if you can't catch your breath by resting or doing a relaxed purse lip breathing pattern. The less you use it, the better it will work when you need it.  Prednisone 10 mg take  4 each am x 2 days,   2 each am x 2 days,  1 each am x2days and stop    01/03/2011 f/u ov/Wert cc much better off ace and advair but still needing saba twice daily on avg. No  Purulent sputum. rec no change in rx/ rule of 2's reviewed       09/11/2012 f/u ov/Wert "did fine" p prev ov then resumed advair 250  And now on normodyne Chief Complaint  Patient presents with  . Acute Visit    Pt c/o increased cough and SOBfor the past 2 wks. Cough is occ prod with minimal clear sputum. Breathing is esp worse at night, waking up 2-3 times at night to use neb.   >>Prednisone 10 mg take  4 each am x 2 days,   2 each am x 2 days,  1 each am x 2 days and stop  Stop advair and normodyne bystolic 20 mg every 12 hours (2 x 10 every 12 hours)  dulera 100 Take 2 puffs first thing in am and then another 2 puffs  about 12 hours later   09/25/2012 Med calendar  Returns for 1 week follow up for med review  We reviewed all her meds .Data base down for med calendars.  She depends on samples , rx to [place] assistance program and generics -as she does not have insurance.  We discussed drug assistance program for [facility].  Will need to change bystolic to bisoprolol for cost issues.  Last visit labs revealed neg d dimer and bnp. CXR w/out acute process.  She feels about the same still gets DOE . Occasional cough and wheezing on/off.  No fever or discolored mucus.  Lives part-time in [facility]  rec Stop Ipratropium Neb.  Continue on current regimen .  We will change Bystolic to Bisoprolol 10mg  Twice daily   We will get paperwork for Drug assistance program for [facility]    11/20/2012 f/u ov/Wert re asthma Chief Complaint  Patient presents with  . Asthma    Breathing has gotten worse since last OV. Pt reports increased wheezing and SOB and DOE. Pt  using rescue inhaler  cough is  variably quite severe worse at hs takes 2 tramadol to control, no excess mucus production. Barking quality. Breathing much better when not coughing, not much better with saba. Present 24/7 but better p the 2 tramadol at hs.  Coughs so hard hurts ant cp mostly center >no changes   12/07/12 Follow up and Med review  new med calendar - pt brought all meds with her today.   We reviewed all her meds and organized them into a patient medication calendar.  It appears she is taking her meds correctly however she depends on samples and drug assistance program for some meds.  She does complain over last 2 weeks of cough, congestion, sinus drainage and wheezing  No fever, chest pain or orthopnea.  OTC cold meds not working on congestion.  rec Zpack take as directed  Mucinex DM Twice daily   Prednisone taper over next week  Follow med calendar closely and bring to each visit.    01/12/2013 f/u ov/Wert re: dtc asthma Chief Complaint  Patient  presents with  . Follow-up    PFT done today.   Even on prednisone still need albuterol hfa avg once at night and neb saba also w/in 2 h of saba hfa for noct cough/ wheeze but much less sob/wheeze during the day eg with walk then worse in 2 weeks despite dulera 100 2bid.  rec Increase dulera to 200 Take 2 puffs first thing in am and then another 2 puffs about 12 hours later.  I will contact Dr Hilarie Fredrickson regarding your night time spells that don't resolve  even on the prednisone Prednisone 10 mg take  4 each am x 2 days,   2 each am x 2 days,  1 each am x 2 days and stop Please schedule a follow up office visit in 4 weeks, sooner if needed with all meds and inhalers in hand ? Why not use reglan at hs Be sure using mucinex dm and tramadol correctly    02/09/2013 f/u ov/Wert re: dtc asthma, did not bring med calendar Chief Complaint  Patient presents with  . Follow-up    Pt states that her breathing is doing well.  She does use her rescue inhaler approx twice per day and has used neb x 3 in the past wk  Symptoms provoked by walking by around the block but this is an improvement over baseline  Nocturnal episodes sev hours p going to bed "gasping for breath" some better p started reglan at hs  >>dulera , trial off singulair   04/06/2013 Follow up and Med Review   Returns for follow up and med review  We reviewed all her meds and organized them into a med calendar. She does have Medicaid but depends on samples. Did not bring all her meds with her today , says they are at home.  Says her breathing is the same since last ov w/ SOB, Has on /off wheezing, some chest heaviness, prod cough with yellow mucus. Good and bad days. Wears out easily .  Denies f/c/s, nausea, vomiting.  Did not stop singulair as recommended.   Current Medications, Allergies, Complete Past Medical History, Past Surgical History, Family History, and Social History were reviewed in [employer] record.  ROS   The following are not active complaints unless bolded sore throat, dysphagia, dental problems, itching, sneezing,  nasal congestion or excess/ purulent secretions, ear ache,   fever, chills, sweats, unintended wt loss, pleuritic or exertional cp, hemoptysis,  orthopnea pnd  or leg swelling, presyncope, palpitations, heartburn, abdominal pain, anorexia, nausea, vomiting, diarrhea  or change in bowel or urinary habits, change in stools or urine, dysuria,hematuria,  rash, arthralgias, visual complaints, headache, numbness weakness or ataxia or problems with walking or coordination,  change in mood/affect or memory.               Objective:   Physical Exam  Obese bf nad   Wt 213  12/18/10 > 01/03/2011  214 > 01/31/2011  206 >  09/11/2012 209 >208 6/19 > 210 09/25/2012 > 11/20/2012 211  >208 12/07/12> 01/12/2013 209 > 02/09/2013 210 >207 04/06/2013   HEENT: nl dentition, turbinates, and orophanx. Nl external ear canals without cough reflex   NECK :  without JVD/Nodes/TM/ nl carotid upstrokes bilaterally   LUNGS: lungs clear   CV:  RRR  no s3 or murmur or increase in P2, no edema   ABD:  soft and nontender with nl excursion in the supine position. No bruits or organomegaly, bowel sounds nl  MS:  warm without deformities, calf tenderness, cyanosis or clubbing   cxr 09/17/12    No acute abnormality      Assessment & Plan:

## 2013-04-07 ENCOUNTER — Encounter: Payer: Self-pay | Admitting: Cardiology

## 2013-04-08 ENCOUNTER — Other Ambulatory Visit (HOSPITAL_BASED_OUTPATIENT_CLINIC_OR_DEPARTMENT_OTHER): Payer: Self-pay

## 2013-04-08 ENCOUNTER — Encounter: Payer: Self-pay | Admitting: Physician Assistant

## 2013-04-08 ENCOUNTER — Telehealth: Payer: Self-pay | Admitting: *Deleted

## 2013-04-08 ENCOUNTER — Ambulatory Visit (HOSPITAL_BASED_OUTPATIENT_CLINIC_OR_DEPARTMENT_OTHER): Payer: Self-pay | Admitting: Physician Assistant

## 2013-04-08 VITALS — BP 192/120 | HR 103 | Temp 98.6°F | Resp 18 | Ht 67.0 in | Wt 207.0 lb

## 2013-04-08 DIAGNOSIS — G620 Drug-induced polyneuropathy: Secondary | ICD-10-CM

## 2013-04-08 DIAGNOSIS — C50412 Malignant neoplasm of upper-outer quadrant of left female breast: Secondary | ICD-10-CM

## 2013-04-08 DIAGNOSIS — Z170 Estrogen receptor positive status [ER+]: Secondary | ICD-10-CM

## 2013-04-08 DIAGNOSIS — C50912 Malignant neoplasm of unspecified site of left female breast: Secondary | ICD-10-CM

## 2013-04-08 DIAGNOSIS — I10 Essential (primary) hypertension: Secondary | ICD-10-CM

## 2013-04-08 DIAGNOSIS — C50419 Malignant neoplasm of upper-outer quadrant of unspecified female breast: Secondary | ICD-10-CM

## 2013-04-08 DIAGNOSIS — J45909 Unspecified asthma, uncomplicated: Secondary | ICD-10-CM

## 2013-04-08 LAB — CBC WITH DIFFERENTIAL/PLATELET
BASO%: 1.4 % (ref 0.0–2.0)
Basophils Absolute: 0.1 10*3/uL (ref 0.0–0.1)
EOS%: 1.4 % (ref 0.0–7.0)
Eosinophils Absolute: 0.1 10*3/uL (ref 0.0–0.5)
HCT: 44.5 % (ref 34.8–46.6)
HGB: 14.6 g/dL (ref 11.6–15.9)
LYMPH#: 1.6 10*3/uL (ref 0.9–3.3)
LYMPH%: 28.7 % (ref 14.0–49.7)
MCH: 30.7 pg (ref 25.1–34.0)
MCHC: 32.9 g/dL (ref 31.5–36.0)
MCV: 93.2 fL (ref 79.5–101.0)
MONO#: 0.4 10*3/uL (ref 0.1–0.9)
MONO%: 6.3 % (ref 0.0–14.0)
NEUT#: 3.5 10*3/uL (ref 1.5–6.5)
NEUT%: 62.2 % (ref 38.4–76.8)
Platelets: 214 10*3/uL (ref 145–400)
RBC: 4.77 10*6/uL (ref 3.70–5.45)
RDW: 13.8 % (ref 11.2–14.5)
WBC: 5.6 10*3/uL (ref 3.9–10.3)

## 2013-04-08 LAB — COMPREHENSIVE METABOLIC PANEL (CC13)
ALBUMIN: 3.9 g/dL (ref 3.5–5.0)
ALT: 40 U/L (ref 0–55)
ANION GAP: 12 meq/L — AB (ref 3–11)
AST: 29 U/L (ref 5–34)
Alkaline Phosphatase: 55 U/L (ref 40–150)
BUN: 6.2 mg/dL — ABNORMAL LOW (ref 7.0–26.0)
CALCIUM: 9.1 mg/dL (ref 8.4–10.4)
CHLORIDE: 103 meq/L (ref 98–109)
CO2: 24 mEq/L (ref 22–29)
Creatinine: 0.8 mg/dL (ref 0.6–1.1)
Glucose: 109 mg/dl (ref 70–140)
POTASSIUM: 3.5 meq/L (ref 3.5–5.1)
SODIUM: 139 meq/L (ref 136–145)
Total Bilirubin: 0.47 mg/dL (ref 0.20–1.20)
Total Protein: 8 g/dL (ref 6.4–8.3)

## 2013-04-08 NOTE — Telephone Encounter (Signed)
appts made and printed...td 

## 2013-04-08 NOTE — Progress Notes (Signed)
ID: [name]   DOB: 1975/04/16  MR#: [id number]  TXM#:[id number]  QMG:NOIBBC [name], MD GYN: SU:  Erroll Luna, MD Other:  Christinia Gully, MD;  Zenovia Jarred, MD;  Ena Dawley, MD  CHIEF COMPLAINT:  Hx of Left  Breast Cancer    HISTORY OF PRESENT ILLNESS: Erica [name] noticed a lump in her left breast sometime in August or September 2009.  Initially this was thought to possibly be "dry breast milk" but as the mass got bigger, she brought it to her physician's attention and was set up for mammography.  This was performed May 20, 2008 at [facility] and it showed a palpable 8.5 cm oval circumscribed mass in the left breast upper outer quadrant.  There were prominent left axillary lymph nodes noted as well.  An ultrasound showed this to be hypoechoic, with lobulated margins and probable central necrosis measuring up to 7.3 cm on this modality.  Ultrasound of the left axilla demonstrated numerous prominent lymph nodes at the upper limit of normal, the largest measuring 1 cm with a normal appearing thin cortex and a normal cortex to halo ratio.    Because the patient had no insurance, she was referred to [facility] and biopsy was performed in [place] with the pathology there ([id number]) showing a poorly differentiated malignancy which was triple negative (ER 1%, PR 0%, HER-2/neu 0).  The axillary lymph nodes were not biopsied. With this information, the patient was presented at the Breast Multidisciplinary Conference and the feeling was that, after appropriate staging, the patient would be a good candidate for neoadjuvant treatment and possibly NSABP B40.    Her subsequent history is as detailed below  INTERVAL HISTORY: Erica [name] returns alone today for followup of her left breast cancer. She is "single now". She continues to live partially with her mother in [place], [place] (near [place]) and partially here. She is still maintaining all her medical care here in [place], however. Her  family is doing well. Her youngest son will be 8 years old son, and is healthy.  Erica [name] herself continues to have problems with poorly controlled asthma and hypertension. She is followed regularly by Dr. Melvyn Novas, Dr. Meda Coffee, and Dr. [name].  Of course she continues to have shortness of breath and tells me it is stable. She denies any chest pain or palpitations. She denies any recent headaches.  Her vision is stable. She feels a slightly dizzy when she first stands up. She has a tremor in both hands. She still has some neuropathic pain in her hands and feet secondary to previous chemotherapy treatments.  REVIEW OF SYSTEMS: Erica [name] has had no fevers, chills, or night sweats. She's had no rashes or skin changes and denies any abnormal bleeding. She tells me she has occasional spotting, but has not had a normal menstrual cycle in many months (She was reminded to use prophylaxis to avoid pregnancy, and tells me she is not sexually active currently.) She still has some occasional abdominal pain and is followed by Dr. Hilarie Fredrickson.   She is having less nausea and is having regular bowel movements. She's had no urinary changes, no dysuria or hematuria. She recently fell and sprained her right ankle  which still causes her some mild discomfort. Otherwise, she denies any additional pain today.  A detailed review of systems is otherwise stable and noncontributory.   PAST MEDICAL HISTORY: Past Medical History  Diagnosis Date  . Breast cancer 2010  . Asthma   . Hypertension   . GERD (gastroesophageal  reflux disease)   . Impaired glucose tolerance 04/13/2011  . Anemia, unspecified 04/13/2011  . Asthma 09/27/2010  . Bloating 03/18/2011  . 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 Lymphadenitis, chronic     restricted LEFT extremity    PAST SURGICAL HISTORY: Past Surgical History  Procedure Laterality Date  . Hand surgery  2012    right; tendon release  . Breast lumpectomy  09/2008    left  . Carpal tunnel release  2011     left hand  . Tubal ligation  05/11/2011    Procedure: POST PARTUM TUBAL LIGATION;  Surgeon: Emeterio Reeve, MD;  Location: [facility];  Service: Gynecology;  Laterality: Bilateral;  Induced for HTN  . Lymph node dissection  2010    left breast; 2 wks after breast lumpectomy    FAMILY HISTORY Family History  Problem Relation Age of Onset  . Diabetes Maternal Grandmother   . Heart disease Maternal Grandmother   . Hypertension Maternal Grandmother   . Cancer Neg Hx   . Alcohol abuse Neg Hx   . Early death Neg Hx   . Hyperlipidemia Neg Hx   . Kidney disease Neg Hx   . Stroke Neg Hx   . Colon cancer Neg Hx   The patient's parents are alive. The patient has one brother and one sister.  There is no ovarian or breast cancer in the immediate family, but the patient's maternal grandmother, who was one of 35 sisters, had one sister with breast cancer diagnosed in her 10.    GYNECOLOGIC HISTORY: updated January 2015 The patient is GX P4, first pregnancy to term at age 83.  She stopped having periods at the time of chemotherapy, and had had no periods for  2 years, then became pregnant in 2012. This was a surprise to her, and she was not aware of the pregnancy until the seventh month. She has not resumed normal menses, but has had some "spotting" on and off for the last several months.  SOCIAL HISTORY: updated  January 2015  She has been a Dance movement psychotherapist in the past. She's currently disabled. she splits her time between [place] and [place], [place]. She's currently single. Erica [name] has four children,  currently 30 year old, 22 years old, 53 and 40 years old. Her oldest daughter is attending college in [place].   Her youngest son is almost 2. 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 Smoking status: Never Smoker   . Smokeless tobacco: Never Used  . Alcohol Use: No     Comment: occasionally      Colonoscopy: Never  PAP: June 2013  Bone density:  Never  Lipid panel: Nov 2014, Dr. Meda Coffee   Allergies  Allergen Reactions  . Aspirin Shortness Of Breath and Palpitations    Pt can take ibuprofen without reaction  . Penicillins Shortness Of Breath and Palpitations    Current Outpatient Prescriptions  Medication Sig Dispense Refill  . acetaZOLAMIDE (DIAMOX) 250 MG tablet Take 2 tablets (500 mg total) by mouth 2 (two) times daily.  60 tablet  0  . albuterol (PROVENTIL HFA;VENTOLIN HFA) 108 (90 BASE) MCG/ACT inhaler Inhale 2 puffs into the lungs every 4 (four) hours as needed for wheezing or shortness of breath (((PLAN A))).  8.5 g  5  . albuterol (PROVENTIL) (2.5 MG/3ML) 0.083% nebulizer solution Take 3 mLs (2.5 mg total) by nebulization every 4 (  four) hours as needed for wheezing or shortness of breath (((PLAN B))).  300 mL  5  . amLODipine-olmesartan (AZOR) 5-40 MG per tablet Take 1 tablet by mouth at bedtime.      . cloNIDine (CATAPRES) 0.2 MG tablet Take 1 tablet (0.2 mg total) by mouth 2 (two) times daily.  180 tablet  1  . hyoscyamine (LEVSIN SL) 0.125 MG SL tablet Place 0.125 mg under the tongue every 8 (eight) hours as needed for cramping.      Marland Kitchen LORazepam (ATIVAN) 0.5 MG tablet Take 0.5 mg by mouth every 8 (eight) hours as needed for anxiety.      . metoCLOPramide (REGLAN) 10 MG tablet Take 1 tablet (10 mg total) by mouth 4 (four) times daily -  with meals and at bedtime.  120 tablet  3  . mometasone-formoterol (DULERA) 200-5 MCG/ACT AERO Inhale 2 puffs into the lungs 2 (two) times daily.  13 g  5  . montelukast (SINGULAIR) 10 MG tablet Take 1 tablet (10 mg total) by mouth at bedtime.  30 tablet  5  . naproxen sodium (ANAPROX) 220 MG tablet Per bottle as needed for joint pain      . nebivolol (BYSTOLIC) 10 MG tablet Take 10 mg by mouth every morning.       . pantoprazole (PROTONIX) 40 MG tablet Take 40 mg by mouth 2 (two) times daily before a meal.      . potassium chloride  SA (K-DUR,KLOR-CON) 20 MEQ tablet Take 20 mEq by mouth 2 (two) times daily.      . sucralfate (CARAFATE) 1 G tablet Take 1 tablet (1 g total) by mouth 4 (four) times daily.  30 tablet  0  . traMADol (ULTRAM) 50 MG tablet 1-2 tabs by mouth every 4 hours as needed for cough or pain      . triamterene-hydrochlorothiazide (MAXZIDE-25) 37.5-25 MG per tablet Take 1 tablet by mouth every morning.      Marland Kitchen dextromethorphan-guaiFENesin (MUCINEX DM) 30-600 MG per 12 hr tablet Take 1 tablet by mouth every 12 (twelve) hours as needed (cough, congestion, thick mucus).       No current facility-administered medications for this visit.    OBJECTIVE:  Filed Vitals:   04/08/13 1536  BP: 192/120  Pulse:   Temp:   Resp:      Body mass index is 32.41 kg/(m^2).    ECOG FS: 2 Filed Weights   04/08/13 1534  Weight: 207 lb (93.895 kg)  Young African American female in no acute distress  Physical Exam: HEENT:  Sclerae anicteric.  Oropharynx clear and moist. Neck supple, trachea midline.  NODES:  No cervical or supraclavicular lymphadenopathy palpated.  BREAST EXAM:  Right breast is unremarkable. Left breast is status post lumpectomy and radiation. No suspicious nodularity or skin changes. No evidence of local recurrence. Axillae are benign bilaterally, with no palpable lymphadenopathy. LUNGS:  Diminished breath sounds bilaterally in the bases, scattered expiratory wheezes. No rhonchi.  HEART:  Regular  Rhythm.  Tachycardic. No murmur  ABDOMEN:  Soft, obese, nontender to palpation. No organomegaly palpated.  Positive bowel sounds.  MSK:  No focal spinal tenderness to palpation. Some limitation in range of motion of the left upper extremity due to discomfort. Full range of motion in the right upper extremity. EXTREMITIES:  No peripheral edema.   SKIN:  Clear with no evidence of rashes or skin lesions. No nail dyscrasia. No unusual ecchymoses or petechiae. NEURO:  Nonfocal. Well oriented.  Appropriate  affect.    LAB RESULTS: Lab Results  Component Value Date   WBC 5.6 04/08/2013   NEUTROABS 3.5 04/08/2013   HGB 14.6 04/08/2013   HCT 44.5 04/08/2013   MCV 93.2 04/08/2013   PLT 214 04/08/2013      Chemistry      Component Value Date/Time   NA 139 04/08/2013 1522   NA 137 02/16/2013 1243   K 3.5 04/08/2013 1522   K 4.0 02/16/2013 1243   CL 103 02/16/2013 1243   CL 103 09/10/2012 1618   CO2 24 04/08/2013 1522   CO2 27 02/16/2013 1243   BUN 6.2* 04/08/2013 1522   BUN 10 02/16/2013 1243   CREATININE 0.8 04/08/2013 1522   CREATININE 1.0 02/16/2013 1243   CREATININE 0.64 05/02/2011 1021      Component Value Date/Time   CALCIUM 9.1 04/08/2013 1522   CALCIUM 9.0 02/16/2013 1243   ALKPHOS 55 04/08/2013 1522   ALKPHOS 46 02/16/2013 1243   AST 29 04/08/2013 1522   AST 35 02/16/2013 1243   ALT 40 04/08/2013 1522   ALT 40* 02/16/2013 1243   BILITOT 0.47 04/08/2013 1522   BILITOT 0.6 02/16/2013 1243        STUDIES:  Most recent bilateral mammogram on 02/05/2013 was unremarkable.     ASSESSMENT: 38 y.o. [place] woman with history of locally advanced left breast carcinoma initially presenting in March 2010   (1) treated neoadjuvantly with 4 cycles of docetaxel/ cyclophosphamide.    (2) status post left lumpectomy and sentinel lymph node dissection in July 2010 for a ypT2 ypN1, stage IIB invasive ductal carcinoma, grade 3, triple negative with MIB-1 of 88%.   (3) status post full axillary dissection and margin clearance in August 2010.   (4) status post 11 doses of weekly paclitaxel completed in December 2010   (5) radiation therapy completed in March 2011.  Now followed with observation alone.   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 We will continue to follow her with labs and physical exam every 6 months. She  is due for her next bilateral mammogram in November 2015. We will likely follow her at least through mid 2016, and then consider allowing her to "graduate" from followup.  Erica [name] is in agreement with this plan.  She'll continue to be followed regularly by her other physicians, but she knows to call us with any changes or problems prior to her followup here in July 2015.   Thana Ramp PA-C    04/08/2013

## 2013-04-14 ENCOUNTER — Encounter (HOSPITAL_COMMUNITY): Payer: Medicaid - Out of State

## 2013-04-20 ENCOUNTER — Encounter (HOSPITAL_COMMUNITY): Payer: Medicaid - Out of State

## 2013-04-22 ENCOUNTER — Ambulatory Visit: Payer: Medicaid - Out of State | Admitting: Cardiology

## 2013-05-04 ENCOUNTER — Ambulatory Visit: Payer: Self-pay | Admitting: Internal Medicine

## 2013-05-07 ENCOUNTER — Encounter: Payer: Self-pay | Admitting: Internal Medicine

## 2013-06-08 ENCOUNTER — Encounter: Payer: Self-pay | Admitting: Internal Medicine

## 2013-06-17 ENCOUNTER — Ambulatory Visit (HOSPITAL_COMMUNITY): Payer: Medicaid - Out of State | Attending: Internal Medicine | Admitting: Radiology

## 2013-06-17 DIAGNOSIS — R079 Chest pain, unspecified: Secondary | ICD-10-CM | POA: Insufficient documentation

## 2013-06-17 DIAGNOSIS — Z9221 Personal history of antineoplastic chemotherapy: Secondary | ICD-10-CM | POA: Insufficient documentation

## 2013-06-17 NOTE — Progress Notes (Signed)
Echocardiogram Performed. 

## 2013-06-23 ENCOUNTER — Ambulatory Visit (INDEPENDENT_AMBULATORY_CARE_PROVIDER_SITE_OTHER): Payer: Medicaid - Out of State | Admitting: Cardiology

## 2013-06-23 ENCOUNTER — Encounter: Payer: Self-pay | Admitting: Cardiology

## 2013-06-23 VITALS — BP 144/90 | HR 92 | Ht 67.0 in | Wt 212.0 lb

## 2013-06-23 DIAGNOSIS — I509 Heart failure, unspecified: Secondary | ICD-10-CM

## 2013-06-23 DIAGNOSIS — G620 Drug-induced polyneuropathy: Secondary | ICD-10-CM

## 2013-06-23 DIAGNOSIS — I5032 Chronic diastolic (congestive) heart failure: Secondary | ICD-10-CM

## 2013-06-23 DIAGNOSIS — I5033 Acute on chronic diastolic (congestive) heart failure: Secondary | ICD-10-CM | POA: Insufficient documentation

## 2013-06-23 DIAGNOSIS — E876 Hypokalemia: Secondary | ICD-10-CM

## 2013-06-23 DIAGNOSIS — I10 Essential (primary) hypertension: Secondary | ICD-10-CM

## 2013-06-23 HISTORY — DX: Chronic diastolic (congestive) heart failure: I50.32

## 2013-06-23 MED ORDER — AMLODIPINE-OLMESARTAN 10-40 MG PO TABS: 1.0000 | ORAL_TABLET | Freq: Every day | ORAL | Status: DC

## 2013-06-23 MED ORDER — PREGABALIN 50 MG PO CAPS: 50.0000 mg | ORAL_CAPSULE | Freq: Three times a day (TID) | ORAL | Status: DC

## 2013-06-23 NOTE — Patient Instructions (Signed)
Your physician has recommended you make the following change in your medication:   1. Stop Azor 5-40 mg.   2. Start Azor 10-40 mg 1 tablet daily.  3. Start Lyrica 50 mg 1 tablet three times a day.  Your physician recommends that you schedule a follow-up appointment in: 2 months with Dr. Meda Coffee.

## 2013-06-23 NOTE — Progress Notes (Signed)
Patient ID: STACEE EARP, female   DOB: Apr 04, 1975, 38 y.o.   MRN: [id number]    Patient Name: Erica [name] Date of Encounter: 06/23/2013  Primary Care Provider:  Scarlette Calico, MD Primary Cardiologist:  Ena Dawley H  Patient Profile  Chest pain  Problem List   Past Medical History  Diagnosis Date  . Breast cancer 2010  . Asthma   . Hypertension   . GERD (gastroesophageal reflux disease)   . Impaired glucose tolerance 04/13/2011  . Anemia, unspecified 04/13/2011  . Asthma 09/27/2010  . Bloating 03/18/2011  . 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 Lymphadenitis, chronic     restricted LEFT extremity   Past Surgical History  Procedure Laterality Date  . Hand surgery  2012    right; tendon release  . Breast lumpectomy  09/2008    left  . Carpal tunnel release  2011    left hand  . Tubal ligation  05/11/2011    Procedure: POST PARTUM TUBAL LIGATION;  Surgeon: Emeterio Reeve, MD;  Location: [facility];  Service: Gynecology;  Laterality: Bilateral;  Induced for HTN  . Lymph node dissection  2010    left breast; 2 wks after breast lumpectomy    Allergies  Allergies  Allergen Reactions  . Aspirin Shortness Of Breath and Palpitations    Pt can take ibuprofen without reaction  . Penicillins Shortness Of Breath and Palpitations    HPI  38 year old female with h/o asthma, obesity, hypertension, who is coming with complaints of chest pain. This pleasant young female has a history of locally advanced left breast carcinoma diagnosed in 2010 and treated with neoadjuvant chemotherapy with docetaxel and cyclophosphamide as well as paclitaxel. This was followed by radiation therapy which was completed in 2011. The patient complains of chest pain it is not exertional, it is locally located in the retrosternal and left epigastric region. It has been happening anytime of the day, and can last for minutes. There is no clear aggravating or alleviating factor. The patient states that ever  since her chemotherapy she gets short of breath with minimal activity and states that she will be able to walk a block.  Follow up after 4 month, no change in her symptoms, she still has exertional shortness of breath and fatigue. She is compliant to her meds and states that about she feels that one of them makes her feel more short of breath, however unsure which one it is.  Home Medications  Prior to Admission medications   Medication Sig Start Date End Date Taking? Authorizing Provider  albuterol (PROVENTIL HFA;VENTOLIN HFA) 108 (90 BASE) MCG/ACT inhaler Inhale 2 puffs into the lungs every 4 (four) hours as needed for wheezing or shortness of breath (((PLAN A))).     Historical Provider, MD  albuterol (PROVENTIL) (2.5 MG/3ML) 0.083% nebulizer solution Take 2.5 mg by nebulization every 4 (four) hours as needed for wheezing or shortness of breath (((PLAN B))). For shortness of breath. 05/07/12   Amy Milda Smart, PA-C  amLODipine-olmesartan (AZOR) 5-40 MG per tablet Take 1 tablet by mouth daily. 08/05/12   [name], MD  cloNIDine (CATAPRES) 0.2 MG tablet Take 1 tablet (0.2 mg total) by mouth 2 (two) times daily. 08/05/12   [name], MD  hyoscyamine (LEVSIN SL) 0.125 MG SL tablet Place 0.125 mg under the tongue every 8 (eight) hours as needed for cramping. 05/19/12   Jerene Bears, MD  LORazepam (ATIVAN) 0.5 MG tablet Take 1  tablet (0.5 mg total) by mouth at bedtime as needed for anxiety. 01/07/13   Chauncey Cruel, MD  metoCLOPramide (REGLAN) 10 MG tablet Take 1 tablet (10 mg total) by mouth 4 (four) times daily -  with meals and at bedtime. 01/14/13   Jerene Bears, MD  mometasone-formoterol (DULERA) 100-5 MCG/ACT AERO Inhale 2 puffs into the lungs 2 (two) times daily. 05/07/12   Amy Milda Smart, PA-C  montelukast (SINGULAIR) 10 MG tablet Take 1 tablet (10 mg total) by mouth at bedtime. 12/31/11   Chauncey Cruel, MD  nebivolol (BYSTOLIC) 10 MG tablet Take 10 mg by mouth at bedtime.    Historical  Provider, MD  pantoprazole (PROTONIX) 40 MG tablet Take 1 tablet (40 mg total) by mouth daily. 01/07/13   Chauncey Cruel, MD  potassium chloride SA (K-DUR,KLOR-CON) 20 MEQ tablet Take 20 mEq by mouth 2 (two) times daily. 06/04/12   [name], MD  sucralfate (CARAFATE) 1 G tablet Take 1 tablet (1 g total) by mouth 4 (four) times daily. 03/07/12   [name], MD  traMADol Veatrice Bourbon) 50 MG tablet 1-2 tabs by mouth every 4 hours as needed for cough or pain 09/10/12   Amy Milda Smart, PA-C  triamterene-hydrochlorothiazide (MAXZIDE-25) 37.5-25 MG per tablet Take 1 tablet by mouth every morning.    Historical Provider, MD    Family History  Family History  Problem Relation Age of Onset  . Diabetes Maternal Grandmother   . Heart disease Maternal Grandmother   . Hypertension Maternal Grandmother   . Cancer Neg Hx   . Alcohol abuse Neg Hx   . Early death Neg Hx   . Hyperlipidemia Neg Hx   . Kidney disease Neg Hx   . Stroke Neg Hx   . Colon cancer Neg Hx     Social History  History   Social History  . Marital Status: Single    Spouse Name: N/A    Number of Children: 3  . Years of Education: N/A   Occupational History  .     Social History Main Topics  . Smoking status: Never Smoker   . Smokeless tobacco: Never Used  . Alcohol Use: No     Comment: occasionally  . Drug Use: No  . Sexual Activity: Yes    Birth Control/ Protection: Surgical   Other Topics Concern  . Not on file   Social History Narrative  . No narrative on file     Review of Systems General:  No chills, fever, night sweats or weight changes.  Cardiovascular:  No chest pain, dyspnea on exertion, edema, orthopnea, palpitations, paroxysmal nocturnal dyspnea. Dermatological: No rash, lesions/masses Respiratory: No cough, dyspnea Urologic: No hematuria, dysuria Abdominal:   No nausea, vomiting, diarrhea, bright red blood per rectum, melena, or hematemesis Neurologic:  No visual changes, wkns, changes in  mental status. All other systems reviewed and are otherwise negative except as noted above.  Physical Exam Blood pressure 144/90, pulse 92, height 5\' 7"  (1.702 m), weight 212 lb (96.163 kg).  General: Pleasant, NAD Psych: Normal affect. Neuro: Alert and oriented X 3. Moves all extremities spontaneously. HEENT: Normal  Neck: Supple without bruits or JVD. Lungs:  Resp regular and unlabored, CTA. Heart: RRR no s3, s4, or murmurs. Abdomen: Soft, non-tender, non-distended, BS + x 4.  Extremities: No clubbing, cyanosis or edema. DP/PT/Radials 2+ and equal bilaterally.  Lipid Panel     Component Value Date/Time   CHOL 196 02/16/2013 1243  TRIG 97.0 02/16/2013 1243   HDL 47.30 02/16/2013 1243   CHOLHDL 4 02/16/2013 1243   VLDL 19.4 02/16/2013 1243   LDLCALC 129* 02/16/2013 1243   Nuclear stress test: 02/15/2013 Impression  Exercise Capacity: Lexiscan with no exercise.  BP Response: Hypertensive blood pressure response.  Clinical Symptoms: Chest pain, headache.  ECG Impression: No significant ST segment change suggestive of ischemia.  Comparison with Prior Nuclear Study: No images to compare  Overall Impression: Normal stress nuclear study.  LV Ejection Fraction: 54%. LV Wall Motion: NL LV Function; NL Wall Motion  Loralie Champagne  02/12/2013   TTE 06/17/2013  Left ventricle: The cavity size was normal. There was mild concentric hypertrophy. Systolic function was normal. The estimated ejection fraction was in the range of 60% to 65%. Wall motion was normal; there were no regional wall motion abnormalities. Features are consistent with a pseudonormal left ventricular filling pattern, with concomitant abnormal relaxation and increased filling pressure (grade 2 diastolic dysfunction). Doppler parameters are consistent with high ventricular filling pressure. Impressions: - When compared to 2010, EF has improved.  Accessory Clinical Findings  ECG - normal sinus rhythm, 100 beats  per minute, normal EKG.    Assessment & Plan  38 year old female with prior medical history of hyperlipidemia, hypertension and breast carcinoma with chemotherapy that can potentially cause heart block, heart failure and angina.  1. Chronic diastolic CHF, associated with dyspnea on exertion - A lexiscan nuclear stress test showed poor exercise capacity, LV EF 54% and no perfusion defect.  Echocardiogram showed preserved LV function it is improved compared to prior echo however she has pseudo-normal pattern of diastolic dysfunction with elevated filling pressures. We'll need to be more aggressive on blood pressure management.   2. HTN - BP elevated on 3 consecutive visits, we will increase amlodipine to 10 mg daily. I verified that by Bystolic is beating at 1 receptor blocker with nitric oxide potentiating vasodilator that should actually help in asthma, therefore will continue.  3. Lipid profile - LDL 129 just recommend exercise and diet.  4. 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

## 2013-07-14 ENCOUNTER — Ambulatory Visit (INDEPENDENT_AMBULATORY_CARE_PROVIDER_SITE_OTHER): Payer: Self-pay | Admitting: Internal Medicine

## 2013-07-14 ENCOUNTER — Encounter: Payer: Self-pay | Admitting: Internal Medicine

## 2013-07-14 ENCOUNTER — Ambulatory Visit (INDEPENDENT_AMBULATORY_CARE_PROVIDER_SITE_OTHER)
Admission: RE | Admit: 2013-07-14 | Discharge: 2013-07-14 | Disposition: A | Discharge status: Home / Self Care | Payer: Self-pay | Source: Ambulatory Visit | Attending: Internal Medicine | Admitting: Internal Medicine

## 2013-07-14 ENCOUNTER — Other Ambulatory Visit (INDEPENDENT_AMBULATORY_CARE_PROVIDER_SITE_OTHER): Payer: Self-pay

## 2013-07-14 VITALS — BP 168/100 | HR 106 | Temp 98.6°F | Ht 67.0 in | Wt 213.0 lb

## 2013-07-14 DIAGNOSIS — R0989 Other specified symptoms and signs involving the circulatory and respiratory systems: Secondary | ICD-10-CM

## 2013-07-14 DIAGNOSIS — R0600 Dyspnea, unspecified: Secondary | ICD-10-CM

## 2013-07-14 DIAGNOSIS — J45909 Unspecified asthma, uncomplicated: Secondary | ICD-10-CM

## 2013-07-14 DIAGNOSIS — R0609 Other forms of dyspnea: Secondary | ICD-10-CM

## 2013-07-14 LAB — CBC WITH DIFFERENTIAL/PLATELET
BASOS PCT: 0.5 % (ref 0.0–3.0)
Basophils Absolute: 0 10*3/uL (ref 0.0–0.1)
EOS PCT: 1.8 % (ref 0.0–5.0)
Eosinophils Absolute: 0.1 10*3/uL (ref 0.0–0.7)
HEMATOCRIT: 43.7 % (ref 36.0–46.0)
Hemoglobin: 14.8 g/dL (ref 12.0–15.0)
LYMPHS ABS: 1.7 10*3/uL (ref 0.7–4.0)
Lymphocytes Relative: 24.3 % (ref 12.0–46.0)
MCHC: 34 g/dL (ref 30.0–36.0)
MCV: 90 fl (ref 78.0–100.0)
Monocytes Absolute: 0.5 10*3/uL (ref 0.1–1.0)
Monocytes Relative: 7.1 % (ref 3.0–12.0)
NEUTROS ABS: 4.6 10*3/uL (ref 1.4–7.7)
Neutrophils Relative %: 66.3 % (ref 43.0–77.0)
PLATELETS: 216 10*3/uL (ref 150.0–400.0)
RBC: 4.85 Mil/uL (ref 3.87–5.11)
RDW: 14.3 % (ref 11.5–14.6)
WBC: 7 10*3/uL (ref 4.5–10.5)

## 2013-07-14 LAB — BASIC METABOLIC PANEL
BUN: 8 mg/dL (ref 6–23)
CO2: 27 mEq/L (ref 19–32)
Calcium: 9.2 mg/dL (ref 8.4–10.5)
Chloride: 100 mEq/L (ref 96–112)
Creatinine, Ser: 1 mg/dL (ref 0.4–1.2)
GFR: 81.87 mL/min (ref >60.00)
Glucose, Bld: 90 mg/dL (ref 70–99)
Potassium: 3.5 mEq/L (ref 3.5–5.1)
Sodium: 135 mEq/L (ref 135–145)

## 2013-07-14 LAB — BRAIN NATRIURETIC PEPTIDE: Pro B Natriuretic peptide (BNP): 13 pg/mL (ref 0.0–100.0)

## 2013-07-14 LAB — TSH: TSH: 0.9 u[IU]/mL (ref 0.35–5.50)

## 2013-07-14 MED ORDER — ALBUTEROL SULFATE HFA 108 (90 BASE) MCG/ACT IN AERS: INHALATION_SPRAY | RESPIRATORY_TRACT | Status: DC

## 2013-07-14 MED ORDER — MONTELUKAST SODIUM 10 MG PO TABS: 10.0000 mg | ORAL_TABLET | Freq: Every day | ORAL | Status: DC

## 2013-07-14 MED ORDER — TRAMADOL HCL 50 MG PO TABS: 50.0000 mg | ORAL_TABLET | ORAL | Status: DC | PRN

## 2013-07-14 MED ORDER — METHYLPREDNISOLONE ACETATE 80 MG/ML IJ SUSP
120.0000 mg | Freq: Once | INTRAMUSCULAR | Status: AC
  Administered 2013-07-14: 120 mg via INTRAMUSCULAR

## 2013-07-14 MED ORDER — MONTELUKAST SODIUM 10 MG PO TABS
10.0000 mg | ORAL_TABLET | Freq: Every day | ORAL | Status: DC
Start: 2013-07-14 — End: 2014-01-26

## 2013-07-14 NOTE — Progress Notes (Signed)
Quick Note:  Spoke with pt and notified of results per Dr. Wert. Pt verbalized understanding and denied any questions.  ______ 

## 2013-07-14 NOTE — Patient Instructions (Addendum)
Depo 120 mg today  Only use your albuterol  (proaire) as a rescue medication to be used if you can't catch your breath by resting or doing a relaxed purse lip breathing pattern.  - The less you use it, the better it will work when you need it. - Ok to use up to 2 puffs  every 4 hours if you must but call for immediate appointment if use goes up over your usual need - Don't leave home without it !!  (think of it like the spare tire for your car)   If not better after the proaire then use the neb up to every 4 hours    Please remember to go to the lab and x-ray department downstairs for your tests - we will call you with the results when they are available.  See Tammy NP w/in 2 weeks with all your medications, even over the counter meds, separated in two separate bags, the ones you take no matter what vs the ones you stop once you feel better and take only as needed when you feel you need them.   Tammy  will generate for you a new user friendly medication calendar that will put [place] all on the same page re: your medication use.     Without this process, it simply isn't possible to assure that we are providing  your outpatient care  with  the attention to detail we feel you deserve.   If we cannot assure that you're getting that kind of care,  then we cannot manage your problem effectively from this clinic.  Once you have seen Tammy and we are sure that we're all on the same page with your medication use she will arrange follow up with me.

## 2013-07-14 NOTE — Progress Notes (Signed)
Subjective:    Patient ID: Erica [name], female    DOB: Nov 12, 1975   MRN: [id number]    Brief patient profile:  59 yobf with asthma all her life much worse since around 07/2010 referred by Dr [name] to pulmonary clinic in Sept 2012 Hx of Breast Cancer 05/2008    History of Present Illness  12/18/2010 Initial pulmonary office eval on acei and   advair cc dtc asthma x 4 months but baseline = excess saba use "even when better" at least twice daily despite daily advair, worse at hs in fact  used saba 3 x last night.  On chemo /RT for breast ca but last rx was Jan 2012 well before asthma worse.  To er multiple times >  no better with singular added x 3 months. rec Stop Lisinopril Dulera 200 Take 2 puffs first thing in am and then another 2 puffs about 12 hours later.  Work on inhaler technique  Nexium 40 mg   Take 30-60 min before first meal of the day and Pepcid 20 mg one bedtime until return Only use your albuterol as a rescue medication to be used if you can't catch your breath by resting or doing a relaxed purse lip breathing pattern. The less you use it, the better it will work when you need it.  Prednisone 10 mg take  4 each am x 2 days,   2 each am x 2 days,  1 each am x2days and stop    01/03/2011 f/u ov/Jaxson Keener cc much better off ace and advair but still needing saba twice daily on avg. No  Purulent sputum. rec no change in rx/ rule of 2's reviewed       09/11/2012 f/u ov/[name] "did fine" p prev ov then resumed advair 250  And now on normodyne Chief Complaint  Patient presents with  . Acute Visit    Pt c/o increased cough and SOBfor the past 2 wks. Cough is occ prod with minimal clear sputum. Breathing is esp worse at night, waking up 2-3 times at night to use neb.   >>Prednisone 10 mg take  4 each am x 2 days,   2 each am x 2 days,  1 each am x 2 days and stop  Stop advair and normodyne bystolic 20 mg every 12 hours (2 x 10 every 12 hours)  dulera 100 Take 2 puffs first thing in  am and then another 2 puffs about 12 hours later   09/25/2012 Med calendar  Returns for 1 week follow up for med review  We reviewed all her meds .Data base down for med calendars.  She depends on samples , rx to [place] assistance program and generics -as she does not have insurance.  We discussed drug assistance program for [facility].  Will need to change bystolic to bisoprolol for cost issues.  Last visit labs revealed neg d dimer and bnp. CXR w/out acute process.  She feels about the same still gets DOE . Occasional cough and wheezing on/off.  No fever or discolored mucus.  Lives part-time in [facility].  rec Stop Ipratropium Neb.  Continue on current regimen .  We will change Bystolic to Bisoprolol 10mg  Twice daily   We will get paperwork for Drug assistance program for [facility]    11/20/2012 f/u ov/[name] re asthma Chief Complaint  Patient presents with  . Asthma    Breathing has gotten worse since last OV. Pt reports increased wheezing and SOB and DOE. Pt  using rescue inhaler  cough is variably quite severe worse at hs takes 2 tramadol to control, no excess mucus production. Barking quality. Breathing much better when not coughing, not much better with saba. Present 24/7 but better p the 2 tramadol at hs.  Coughs so hard hurts ant cp mostly center >no changes   12/07/12 Follow up and Med review  new med calendar - pt brought all meds with her today.   We reviewed all her meds and organized them into a patient medication calendar.  It appears she is taking her meds correctly however she depends on samples and drug assistance program for some meds.  She does complain over last 2 weeks of cough, congestion, sinus drainage and wheezing  No fever, chest pain or orthopnea.  OTC cold meds not working on congestion.  rec Zpack take as directed  Mucinex DM Twice daily   Prednisone taper over next week  Follow med calendar closely and bring to each visit.    01/12/2013 f/u ov/[name] re: dtc  asthma Chief Complaint  Patient presents with  . Follow-up    PFT done today.   Even on prednisone still need albuterol hfa avg once at night and neb saba also w/in 2 h of saba hfa for noct cough/ wheeze but much less sob/wheeze during the day eg with walk then worse in 2 weeks despite dulera 100 2bid.  rec Increase dulera to 200 Take 2 puffs first thing in am and then another 2 puffs about 12 hours later.  I will contact Dr Hilarie Fredrickson regarding your night time spells that don't resolve  even on the prednisone Prednisone 10 mg take  4 each am x 2 days,   2 each am x 2 days,  1 each am x 2 days and stop Please schedule a follow up office visit in 4 weeks, sooner if needed with all meds and inhalers in hand ? Why not use reglan at hs Be sure using mucinex dm and tramadol correctly    02/09/2013 f/u ov/[name] re: dtc asthma, did not bring med calendar Chief Complaint  Patient presents with  . Follow-up    Pt states that her breathing is doing well.  She does use her rescue inhaler approx twice per day and has used neb x 3 in the past wk  Symptoms provoked by walking by around the block but this is an improvement over baseline  Nocturnal episodes sev hours p going to bed "gasping for breath" some better p started reglan at hs  >>dulera , trial off singulair   04/06/2013 Follow up and Med Review   Returns for follow up and med review  We reviewed all her meds and organized them into a med calendar. She does have Medicaid but depends on samples. Did not bring all her meds with her today , says they are at home.  Says her breathing is the same since last ov w/ SOB, Has on /off wheezing, some chest heaviness, prod cough with yellow mucus. Good and bad days. Wears out easily .  Denies f/c/s, nausea, vomiting.  Did not stop singulair as recommended.  rec  Follow med calendar closely and bring to each visit > did not do  We are referring you for a CPSTwith spirometry before and after> never  done   07/14/2013 f/u ov/[name] re: dtc asthma vs vcd / no med calendar/ confused with meds/instructions  Chief Complaint  Patient presents with  . Follow-up    Pt states  her breathing has been worse for the past 6 wks. She states that she feels out of breath all the time. She also states having increased cough x 6 wks- prod with large amounts of tan colored sputum.   not able to verify what she's taking at this point but "nothing works" including saba   No obvious day to day or daytime variabilty or assoc   cp or chest tightness, subjective wheeze overt sinus or hb symptoms. No unusual exp hx or h/o childhood pna/ asthma or knowledge of premature birth.  Sleeping ok without nocturnal  or early am exacerbation  of respiratory  c/o's or need for noct saba. Also denies any obvious fluctuation of symptoms with weather or environmental changes or other aggravating or alleviating factors except as outlined above   Current Medications, Allergies, Complete Past Medical History, Past Surgical History, Family History, and Social History were reviewed in [employer] record.  ROS  The following are not active complaints unless bolded sore throat, dysphagia, dental problems, itching, sneezing,  nasal congestion or excess/ purulent secretions, ear ache,   fever, chills, sweats, unintended wt loss, pleuritic or exertional cp, hemoptysis,  orthopnea pnd or leg swelling, presyncope, palpitations, heartburn, abdominal pain, anorexia, nausea, vomiting, diarrhea  or change in bowel or urinary habits, change in stools or urine, dysuria,hematuria,  rash, arthralgias, visual complaints, headache, numbness weakness or ataxia or problems with walking or coordination,  change in mood/affect or memory.               Objective:   Physical Exam  Obese bf nad    Wt 213  12/18/10 > 01/03/2011  214 > 01/31/2011  206 >  09/11/2012 209 >208 6/19 > 210 09/25/2012 > 11/20/2012 211 >208 12/07/12> 01/12/2013 209  > 02/09/2013 210 >207 04/06/2013 > 07/14/2013  213  HEENT: nl dentition, turbinates, and orophanx. Nl external ear canals without cough reflex   NECK :  without JVD/Nodes/TM/ nl carotid upstrokes bilaterally   LUNGS: lungs clear bilaterally with good air movement/ mild pseudowheeze   CV:  RRR  no s3 or murmur or increase in P2, no edema   ABD:  soft and nontender with nl excursion in the supine position. No bruits or organomegaly, bowel sounds nl  MS:  warm without deformities, calf tenderness, cyanosis or clubbing    CXR  07/14/2013 :  Cardiomediastinal silhouette is stable. No acute infiltrate or pleural effusion. No pulmonary edema. Bony thorax is unremarkable.    Recent Labs Lab 07/14/13 1300  NA 135  K 3.5  CL 100  CO2 27  BUN 8  CREATININE 1.0  GLUCOSE 90    Recent Labs Lab 07/14/13 1300  HGB 14.8  HCT 43.7  WBC 7.0  PLT 216.0      Lab Results  Component Value Date   TSH 0.90 07/14/2013     Lab Results  Component Value Date   PROBNP 13.0 07/14/2013       Assessment & Plan:

## 2013-07-15 ENCOUNTER — Ambulatory Visit (INDEPENDENT_AMBULATORY_CARE_PROVIDER_SITE_OTHER): Payer: Medicaid - Out of State | Admitting: Internal Medicine

## 2013-07-15 ENCOUNTER — Encounter: Payer: Self-pay | Admitting: Internal Medicine

## 2013-07-15 VITALS — BP 132/88 | HR 84 | Temp 98.7°F | Resp 16 | Ht 67.0 in | Wt 209.0 lb

## 2013-07-15 DIAGNOSIS — C50412 Malignant neoplasm of upper-outer quadrant of left female breast: Secondary | ICD-10-CM

## 2013-07-15 DIAGNOSIS — G620 Drug-induced polyneuropathy: Secondary | ICD-10-CM

## 2013-07-15 DIAGNOSIS — I10 Essential (primary) hypertension: Secondary | ICD-10-CM

## 2013-07-15 MED ORDER — NEBIVOLOL HCL 10 MG PO TABS: 10.0000 mg | ORAL_TABLET | Freq: Every morning | ORAL | Status: DC

## 2013-07-15 NOTE — Assessment & Plan Note (Signed)
Her BP is well controlled Recent lytes and renal function look good 

## 2013-07-15 NOTE — Progress Notes (Signed)
Pre visit review using our clinic review tool, if applicable. No additional management support is needed unless otherwise documented below in the visit note. 

## 2013-07-15 NOTE — Assessment & Plan Note (Signed)
Neurology referral 

## 2013-07-15 NOTE — Patient Instructions (Signed)

## 2013-07-15 NOTE — Progress Notes (Signed)
Subjective:    Patient ID: Erica [name], female    DOB: 21-Sep-1975, 38 y.o.   MRN: [id number]  Hypertension This is a chronic problem. The current episode started more than 1 year ago. The problem has been gradually improving since onset. The problem is controlled. Pertinent negatives include no anxiety, blurred vision, chest pain, headaches, malaise/fatigue, neck pain, orthopnea, palpitations, peripheral edema, PND, shortness of breath or sweats. Agents associated with hypertension include NSAIDs. Past treatments include angiotensin blockers, calcium channel blockers, beta blockers, central alpha agonists and diuretics. The current treatment provides moderate improvement. Compliance problems include exercise and diet.       Review of Systems  Constitutional: Negative.  Negative for fever, chills, malaise/fatigue, diaphoresis, appetite change and fatigue.  HENT: Negative.   Eyes: Negative.  Negative for blurred vision.  Respiratory: Negative.  Negative for cough, choking, chest tightness, shortness of breath, wheezing and stridor.   Cardiovascular: Negative.  Negative for chest pain, palpitations, orthopnea, leg swelling and PND.  Gastrointestinal: Negative.  Negative for nausea, vomiting, abdominal pain, diarrhea, constipation and blood in stool.  Endocrine: Negative.   Genitourinary: Negative.   Musculoskeletal: Negative.  Negative for arthralgias, back pain, myalgias, neck pain and neck stiffness.       She complains of persistent stabbing/burning pain in all 4 extremities that she attributes to chemotherapy, she has tried lyrica and tramadol without much relief and she requests a referral to a specialist.  Skin: Negative.   Allergic/Immunologic: Negative.   Neurological: Negative for headaches.  Hematological: Negative.  Negative for adenopathy. Does not bruise/bleed easily.  Psychiatric/Behavioral: Negative.        Objective:   Physical Exam  Vitals reviewed. Constitutional:  She is oriented to person, place, and time. She appears well-developed and well-nourished. No distress.  HENT:  Head: Normocephalic and atraumatic.  Mouth/Throat: Oropharynx is clear and moist. No oropharyngeal exudate.  Eyes: Conjunctivae are normal. Right eye exhibits no discharge. Left eye exhibits no discharge. No scleral icterus.  Neck: Normal range of motion. Neck supple. No JVD present. No tracheal deviation present. No thyromegaly present.  Cardiovascular: Normal rate, regular rhythm, normal heart sounds and intact distal pulses.  Exam reveals no gallop and no friction rub.   No murmur heard. Pulmonary/Chest: Effort normal and breath sounds normal. No stridor. No respiratory distress. She has no wheezes. She has no rales. She exhibits no tenderness.  Abdominal: Soft. Bowel sounds are normal. She exhibits no distension and no mass. There is no tenderness. There is no rebound and no guarding.  Musculoskeletal: Normal range of motion. She exhibits no edema and no tenderness.  Lymphadenopathy:    She has no cervical adenopathy.  Neurological: She is alert and oriented to person, place, and time. She displays no atrophy, no tremor and normal reflexes. No cranial nerve deficit. She exhibits normal muscle tone. Coordination and gait normal.  Reflex Scores:      Tricep reflexes are 1+ on the right side and 1+ on the left side.      Bicep reflexes are 1+ on the right side and 1+ on the left side.      Brachioradialis reflexes are 1+ on the right side and 1+ on the left side.      Patellar reflexes are 1+ on the right side and 1+ on the left side.      Achilles reflexes are 1+ on the right side and 1+ on the left side. Skin: Skin is warm and dry. No rash  noted. She is not diaphoretic. No erythema. No pallor.  Psychiatric: She has a normal mood and affect. Her behavior is normal. Judgment and thought content normal.     Lab Results  Component Value Date   WBC 7.0 07/14/2013   HGB 14.8  07/14/2013   HCT 43.7 07/14/2013   PLT 216.0 07/14/2013   GLUCOSE 90 07/14/2013   CHOL 196 02/16/2013   TRIG 97.0 02/16/2013   HDL 47.30 02/16/2013   LDLCALC 129* 02/16/2013   ALT 40 04/08/2013   AST 29 04/08/2013   NA 135 07/14/2013   K 3.5 07/14/2013   CL 100 07/14/2013   CREATININE 1.0 07/14/2013   BUN 8 07/14/2013   CO2 27 07/14/2013   TSH 0.90 07/14/2013   INR 1.1* 06/03/2012   HGBA1C 5.2 03/02/2012       Assessment & Plan:

## 2013-07-16 ENCOUNTER — Telehealth: Payer: Self-pay | Admitting: Internal Medicine

## 2013-07-16 NOTE — Telephone Encounter (Signed)
Relevant patient education mailed to patient.  

## 2013-07-17 ENCOUNTER — Encounter: Payer: Self-pay | Admitting: Internal Medicine

## 2013-07-18 NOTE — Assessment & Plan Note (Signed)
Needs cpst p confirm compliance with complex regimen

## 2013-07-18 NOTE — Assessment & Plan Note (Addendum)
-   PFT's 01/31/2011 FEV1  1.76 (60% ) ratio 68 and no better p B2,  DLCO 70% -med review 09/25/2012 > Dulera drug assistance  paperwork given    - HFA 90% 11/20/2012 .  -Med calendar 12/07/12 , 04/06/2013  - PFT's 01/12/2013  1.73 (65%)  Ratio 73 and no better p B2, DLCO 86% - trial off singulair 02/08/13 >>>did not stop   Symptoms are markedly disproportionate to objective findings and not clear this is a lung problem but pt does appear to have difficult airway management issues.  DDX of  difficult airways managment all start with A and  include Adherence, Ace Inhibitors, Acid Reflux, Active Sinus Disease, Alpha 1 Antitripsin deficiency, Anxiety masquerading as Airways dz,  ABPA,  allergy(esp in young), Aspiration (esp in elderly), Adverse effects of DPI,  Active smokers, plus two Bs  = Bronchiectasis and Beta blocker use..and one C= CHF  Adherence is always the initial "prime suspect" and is a multilayered concern that requires a "trust but verify" approach in every patient - starting with knowing how to use medications, especially inhalers, correctly, keeping up with refills and understanding the fundamental difference between maintenance and prns vs those medications only taken for a very short course and then stopped and not refilled.  -  To keep things simple, I have asked the patient to first separate medicines that are perceived as maintenance, that is to be taken daily "no matter what", from those medicines that are taken on only on an as-needed basis and I have given the patient examples of both, and then return to see our NP to generate a  detailed  medication calendar which should be followed until the next physician sees the patient and updates it.   ? chf > last echo had improved to nl ef as of 06/17/13   ? Acid (or non-acid) GERD > always difficult to exclude as up to 75% of pts in some series report no assoc GI/ Heartburn symptoms> rec continue max (24h)  acid suppression and diet restrictions/  reviewed and instructions given in writing.   ? Allergy > depomedrol 120 IM today   See instructions for specific recommendations which were reviewed directly with the patient who was given a copy with highlighter outlining the key components.

## 2013-07-19 MED ORDER — METHYLPREDNISOLONE ACETATE 80 MG/ML IJ SUSP
120.0000 mg | Freq: Once | INTRAMUSCULAR | Status: AC
  Administered 2013-07-19: 120 mg via INTRAMUSCULAR

## 2013-07-26 ENCOUNTER — Emergency Department (HOSPITAL_COMMUNITY)
Admission: EM | Admit: 2013-07-26 | Discharge: 2013-07-27 | Disposition: A | Discharge status: Home / Self Care | Payer: Medicaid - Out of State | Attending: Emergency Medicine | Admitting: Emergency Medicine

## 2013-07-26 DIAGNOSIS — Z880 Allergy status to penicillin: Secondary | ICD-10-CM | POA: Insufficient documentation

## 2013-07-26 DIAGNOSIS — J9801 Acute bronchospasm: Secondary | ICD-10-CM | POA: Insufficient documentation

## 2013-07-26 DIAGNOSIS — R141 Gas pain: Secondary | ICD-10-CM | POA: Insufficient documentation

## 2013-07-26 DIAGNOSIS — I10 Essential (primary) hypertension: Secondary | ICD-10-CM | POA: Insufficient documentation

## 2013-07-26 DIAGNOSIS — Z79899 Other long term (current) drug therapy: Secondary | ICD-10-CM | POA: Insufficient documentation

## 2013-07-26 DIAGNOSIS — Z888 Allergy status to other drugs, medicaments and biological substances status: Secondary | ICD-10-CM | POA: Insufficient documentation

## 2013-07-26 DIAGNOSIS — D649 Anemia, unspecified: Secondary | ICD-10-CM | POA: Insufficient documentation

## 2013-07-26 DIAGNOSIS — R142 Eructation: Secondary | ICD-10-CM

## 2013-07-26 DIAGNOSIS — R143 Flatulence: Secondary | ICD-10-CM

## 2013-07-26 DIAGNOSIS — J209 Acute bronchitis, unspecified: Secondary | ICD-10-CM

## 2013-07-26 DIAGNOSIS — J45909 Unspecified asthma, uncomplicated: Secondary | ICD-10-CM | POA: Insufficient documentation

## 2013-07-26 DIAGNOSIS — R209 Unspecified disturbances of skin sensation: Secondary | ICD-10-CM | POA: Insufficient documentation

## 2013-07-26 DIAGNOSIS — I881 Chronic lymphadenitis, except mesenteric: Secondary | ICD-10-CM | POA: Insufficient documentation

## 2013-07-26 DIAGNOSIS — R42 Dizziness and giddiness: Secondary | ICD-10-CM | POA: Insufficient documentation

## 2013-07-26 DIAGNOSIS — R0789 Other chest pain: Secondary | ICD-10-CM

## 2013-07-26 DIAGNOSIS — K219 Gastro-esophageal reflux disease without esophagitis: Secondary | ICD-10-CM | POA: Insufficient documentation

## 2013-07-26 DIAGNOSIS — Z853 Personal history of malignant neoplasm of breast: Secondary | ICD-10-CM | POA: Insufficient documentation

## 2013-07-26 NOTE — ED Notes (Signed)
Pt states she has been having CP for 6 months, but today was accompanied with left arm numbness.  EMS gave 4mg  Zofran.  Pt states she was SOB at home, but took a breathing treatment and was resolved by the time EMS arrived.

## 2013-07-27 ENCOUNTER — Emergency Department (HOSPITAL_COMMUNITY): Payer: Medicaid - Out of State

## 2013-07-27 ENCOUNTER — Encounter (HOSPITAL_COMMUNITY): Payer: Self-pay | Admitting: Emergency Medicine

## 2013-07-27 LAB — CBC
HEMATOCRIT: 40.3 % (ref 36.0–46.0)
Hemoglobin: 14.3 g/dL (ref 12.0–15.0)
MCH: 31.2 pg (ref 26.0–34.0)
MCHC: 35.5 g/dL (ref 30.0–36.0)
MCV: 88 fL (ref 78.0–100.0)
Platelets: 244 10*3/uL (ref 150–400)
RBC: 4.58 MIL/uL (ref 3.87–5.11)
RDW: 13.6 % (ref 11.5–15.5)
WBC: 10.4 10*3/uL (ref 4.0–10.5)

## 2013-07-27 LAB — I-STAT TROPONIN, ED
Troponin i, poc: 0 ng/mL (ref 0.00–0.08)
Troponin i, poc: 0.01 ng/mL (ref 0.00–0.08)

## 2013-07-27 LAB — BASIC METABOLIC PANEL
BUN: 15 mg/dL (ref 6–23)
CO2: 23 mEq/L (ref 19–32)
CREATININE: 1.03 mg/dL (ref 0.50–1.10)
Calcium: 9.7 mg/dL (ref 8.4–10.5)
Chloride: 101 mEq/L (ref 96–112)
GFR, EST AFRICAN AMERICAN: 78 mL/min — AB (ref >90)
GFR, EST NON AFRICAN AMERICAN: 68 mL/min — AB (ref >90)
Glucose, Bld: 98 mg/dL (ref 70–99)
Potassium: 3.9 mEq/L (ref 3.7–5.3)
Sodium: 139 mEq/L (ref 137–147)

## 2013-07-27 MED ORDER — PREDNISONE 50 MG PO TABS: 50.0000 mg | ORAL_TABLET | Freq: Every day | ORAL | Status: DC

## 2013-07-27 MED ORDER — OXYMETAZOLINE HCL 0.05 % NA SOLN: 1.0000 | Freq: Two times a day (BID) | NASAL | Status: DC

## 2013-07-27 MED ORDER — BENZONATATE 100 MG PO CAPS: 100.0000 mg | ORAL_CAPSULE | Freq: Three times a day (TID) | ORAL | Status: DC

## 2013-07-27 MED ORDER — MORPHINE SULFATE 4 MG/ML IJ SOLN
4.0000 mg | Freq: Once | INTRAMUSCULAR | Status: AC
  Administered 2013-07-27: 4 mg via INTRAVENOUS
  Filled 2013-07-27: qty 1

## 2013-07-27 MED ORDER — MOMETASONE FUROATE 50 MCG/ACT NA SUSP: 2.0000 | Freq: Every day | NASAL | Status: DC

## 2013-07-27 MED ORDER — ALBUTEROL SULFATE (2.5 MG/3ML) 0.083% IN NEBU
5.0000 mg | INHALATION_SOLUTION | Freq: Once | RESPIRATORY_TRACT | Status: AC
  Administered 2013-07-27: 5 mg via RESPIRATORY_TRACT
  Filled 2013-07-27: qty 6

## 2013-07-27 MED ORDER — MECLIZINE HCL 25 MG PO TABS: 25.0000 mg | ORAL_TABLET | Freq: Three times a day (TID) | ORAL | Status: DC | PRN

## 2013-07-27 MED ORDER — METHYLPREDNISOLONE SODIUM SUCC 125 MG IJ SOLR
125.0000 mg | Freq: Once | INTRAMUSCULAR | Status: AC
  Administered 2013-07-27: 125 mg via INTRAVENOUS
  Filled 2013-07-27: qty 2

## 2013-07-27 NOTE — Discharge Instructions (Signed)
Bronchitis Bronchitis is inflammation of the airways that extend from the windpipe into the lungs (bronchi). The inflammation often causes mucus to develop, which leads to a cough. If the inflammation becomes severe, it may cause shortness of breath. CAUSES  Bronchitis may be caused by:   Viral infections.   Bacteria.   Cigarette smoke.   Allergens, pollutants, and other irritants.  SIGNS AND SYMPTOMS  The most common symptom of bronchitis is a frequent cough that produces mucus. Other symptoms include:  Fever.   Body aches.   Chest congestion.   Chills.   Shortness of breath.   Sore throat.  DIAGNOSIS  Bronchitis is usually diagnosed through a medical history and physical exam. Tests, such as chest X-rays, are sometimes done to rule out other conditions.  TREATMENT  You may need to avoid contact with whatever caused the problem (smoking, for example). Medicines are sometimes needed. These may include:  Antibiotics. These may be prescribed if the condition is caused by bacteria.  Cough suppressants. These may be prescribed for relief of cough symptoms.   Inhaled medicines. These may be prescribed to help open your airways and make it easier for you to breathe.   Steroid medicines. These may be prescribed for those with recurrent (chronic) bronchitis. HOME CARE INSTRUCTIONS  Get plenty of rest.   Drink enough fluids to keep your urine clear or pale yellow (unless you have a medical condition that requires fluid restriction). Increasing fluids may help thin your secretions and will prevent dehydration.   Only take over-the-counter or prescription medicines as directed by your health care provider.  Only take antibiotics as directed. Make sure you finish them even if you start to feel better.  Avoid secondhand smoke, irritating chemicals, and strong fumes. These will make bronchitis worse. If you are a smoker, quit smoking. Consider using nicotine gum or  skin patches to help control withdrawal symptoms. Quitting smoking will help your lungs heal faster.   Put a cool-mist humidifier in your bedroom at night to moisten the air. This may help loosen mucus. Change the water in the humidifier daily. You can also run the hot water in your shower and sit in the bathroom with the door closed for 5 10 minutes.   Follow up with your health care provider as directed.   Wash your hands frequently to avoid catching bronchitis again or spreading an infection to others.  SEEK MEDICAL CARE IF: Your symptoms do not improve after 1 week of treatment.  SEEK IMMEDIATE MEDICAL CARE IF:  Your fever increases.  You have chills.   You have chest pain.   You have worsening shortness of breath.   You have bloody sputum.  You faint.  You have lightheadedness.  You have a severe headache.   You vomit repeatedly. MAKE SURE YOU:   Understand these instructions.  Will watch your condition.  Will get help right away if you are not doing well or get worse. Document Released: 03/18/2005 Document Revised: 01/06/2013 Document Reviewed: 11/10/2012 [facility] Patient Information 2014 [place].  Chest Pain (Nonspecific) Chest pain has many causes. Your pain could be caused by something serious, such as a heart attack or a blood clot in the lungs. It could also be caused by something less serious, such as a chest bruise or a virus. Follow up with your doctor. More lab tests or other studies may be needed to find the cause of your pain. Most of the time, nonspecific chest pain will improve within  2 to 3 days of rest and mild pain medicine. HOME CARE  For chest bruises, you may put ice on the sore area for 15-20 minutes, 03-04 times a day. Do this only if it makes you feel better.  Put ice in a plastic bag.  Place a towel between the skin and the bag.  Rest for the next 2 to 3 days.  Go back to work if the pain improves.  See your doctor if  the pain lasts longer than 1 to 2 weeks.  Only take medicine as told by your doctor.  Quit smoking if you smoke. GET HELP RIGHT AWAY IF:   There is more pain or pain that spreads to the arm, neck, jaw, back, or belly (abdomen).  You have shortness of breath.  You cough more than usual or cough up blood.  You have very bad back or belly pain, feel sick to your stomach (nauseous), or throw up (vomit).  You have very bad weakness.  You pass out (faint).  You have a fever. Any of these problems may be serious and may be an emergency. Do not wait to see if the problems will go away. Get medical help right away. Call your local emergency services 911 in [place] Do not drive yourself to the hospital. MAKE SURE YOU:   Understand these instructions.  Will watch this condition.  Will get help right away if you or your child is not doing well or gets worse. Document Released: 09/04/2007 Document Revised: 06/10/2011 Document Reviewed: 09/04/2007 [facility] Patient Information 2014 [place], [place].

## 2013-07-28 ENCOUNTER — Encounter (HOSPITAL_COMMUNITY): Payer: Self-pay | Admitting: Emergency Medicine

## 2013-07-28 ENCOUNTER — Emergency Department (HOSPITAL_COMMUNITY)
Admission: EM | Admit: 2013-07-28 | Discharge: 2013-07-29 | Disposition: A | Discharge status: Home / Self Care | Payer: Medicaid - Out of State | Attending: Emergency Medicine | Admitting: Emergency Medicine

## 2013-07-28 ENCOUNTER — Ambulatory Visit (INDEPENDENT_AMBULATORY_CARE_PROVIDER_SITE_OTHER): Payer: Medicaid - Out of State | Admitting: Adult Health

## 2013-07-28 ENCOUNTER — Encounter: Payer: Self-pay | Admitting: Adult Health

## 2013-07-28 VITALS — BP 134/82 | HR 91 | Temp 98.6°F | Ht 67.0 in | Wt 212.0 lb

## 2013-07-28 DIAGNOSIS — IMO0002 Reserved for concepts with insufficient information to code with codable children: Secondary | ICD-10-CM | POA: Insufficient documentation

## 2013-07-28 DIAGNOSIS — Y9302 Activity, running: Secondary | ICD-10-CM | POA: Insufficient documentation

## 2013-07-28 DIAGNOSIS — S8990XA Unspecified injury of unspecified lower leg, initial encounter: Secondary | ICD-10-CM | POA: Insufficient documentation

## 2013-07-28 DIAGNOSIS — S99919A Unspecified injury of unspecified ankle, initial encounter: Principal | ICD-10-CM

## 2013-07-28 DIAGNOSIS — J45909 Unspecified asthma, uncomplicated: Secondary | ICD-10-CM | POA: Insufficient documentation

## 2013-07-28 DIAGNOSIS — I10 Essential (primary) hypertension: Secondary | ICD-10-CM | POA: Insufficient documentation

## 2013-07-28 DIAGNOSIS — K219 Gastro-esophageal reflux disease without esophagitis: Secondary | ICD-10-CM | POA: Insufficient documentation

## 2013-07-28 DIAGNOSIS — Z862 Personal history of diseases of the blood and blood-forming organs and certain disorders involving the immune mechanism: Secondary | ICD-10-CM | POA: Insufficient documentation

## 2013-07-28 DIAGNOSIS — Z8669 Personal history of other diseases of the nervous system and sense organs: Secondary | ICD-10-CM | POA: Insufficient documentation

## 2013-07-28 DIAGNOSIS — Z79899 Other long term (current) drug therapy: Secondary | ICD-10-CM | POA: Insufficient documentation

## 2013-07-28 DIAGNOSIS — M79676 Pain in unspecified toe(s): Secondary | ICD-10-CM

## 2013-07-28 DIAGNOSIS — Z880 Allergy status to penicillin: Secondary | ICD-10-CM | POA: Insufficient documentation

## 2013-07-28 DIAGNOSIS — Y929 Unspecified place or not applicable: Secondary | ICD-10-CM | POA: Insufficient documentation

## 2013-07-28 DIAGNOSIS — S99929A Unspecified injury of unspecified foot, initial encounter: Principal | ICD-10-CM

## 2013-07-28 DIAGNOSIS — Z853 Personal history of malignant neoplasm of breast: Secondary | ICD-10-CM | POA: Insufficient documentation

## 2013-07-28 NOTE — Assessment & Plan Note (Addendum)
Exacerbation now resolving  Patient's medications were reviewed today and patient education was given. Computerized medication calendar was adjusted/completed  Plan  Taper off prednisone as directed.  Follow med calendar closely and bring to each visit.  Follow up Dr. Melvyn Novas  In 3 months and As needed   Please contact office for sooner follow up if symptoms do not improve or worsen or seek emergency care

## 2013-07-28 NOTE — Progress Notes (Signed)
Subjective:    Patient ID: Erica [name], female    DOB: Nov 12, 1975   MRN: [id number]    Brief patient profile:  59 yobf with asthma all her life much worse since around 07/2010 referred by Dr [name] to pulmonary clinic in Sept 2012 Hx of Breast Cancer 05/2008    History of Present Illness  12/18/2010 Initial pulmonary office eval on acei and   advair cc dtc asthma x 4 months but baseline = excess saba use "even when better" at least twice daily despite daily advair, worse at hs in fact  used saba 3 x last night.  On chemo /RT for breast ca but last rx was Jan 2012 well before asthma worse.  To er multiple times >  no better with singular added x 3 months. rec Stop Lisinopril Dulera 200 Take 2 puffs first thing in am and then another 2 puffs about 12 hours later.  Work on inhaler technique  Nexium 40 mg   Take 30-60 min before first meal of the day and Pepcid 20 mg one bedtime until return Only use your albuterol as a rescue medication to be used if you can't catch your breath by resting or doing a relaxed purse lip breathing pattern. The less you use it, the better it will work when you need it.  Prednisone 10 mg take  4 each am x 2 days,   2 each am x 2 days,  1 each am x2days and stop    01/03/2011 f/u ov/Wert cc much better off ace and advair but still needing saba twice daily on avg. No  Purulent sputum. rec no change in rx/ rule of 2's reviewed       09/11/2012 f/u ov/Wert "did fine" p prev ov then resumed advair 250  And now on normodyne Chief Complaint  Patient presents with  . Acute Visit    Pt c/o increased cough and SOBfor the past 2 wks. Cough is occ prod with minimal clear sputum. Breathing is esp worse at night, waking up 2-3 times at night to use neb.   >>Prednisone 10 mg take  4 each am x 2 days,   2 each am x 2 days,  1 each am x 2 days and stop  Stop advair and normodyne bystolic 20 mg every 12 hours (2 x 10 every 12 hours)  dulera 100 Take 2 puffs first thing in  am and then another 2 puffs about 12 hours later   09/25/2012 Med calendar  Returns for 1 week follow up for med review  We reviewed all her meds .Data base down for med calendars.  She depends on samples , rx to [place] assistance program and generics -as she does not have insurance.  We discussed drug assistance program for [facility].  Will need to change bystolic to bisoprolol for cost issues.  Last visit labs revealed neg d dimer and bnp. CXR w/out acute process.  She feels about the same still gets DOE . Occasional cough and wheezing on/off.  No fever or discolored mucus.  Lives part-time in [facility].  rec Stop Ipratropium Neb.  Continue on current regimen .  We will change Bystolic to Bisoprolol 10mg  Twice daily   We will get paperwork for Drug assistance program for [facility]    11/20/2012 f/u ov/Wert re asthma Chief Complaint  Patient presents with  . Asthma    Breathing has gotten worse since last OV. Pt reports increased wheezing and SOB and DOE. Pt  using rescue inhaler  cough is variably quite severe worse at hs takes 2 tramadol to control, no excess mucus production. Barking quality. Breathing much better when not coughing, not much better with saba. Present 24/7 but better p the 2 tramadol at hs.  Coughs so hard hurts ant cp mostly center >no changes   12/07/12 Follow up and Med review  new med calendar - pt brought all meds with her today.   We reviewed all her meds and organized them into a patient medication calendar.  It appears she is taking her meds correctly however she depends on samples and drug assistance program for some meds.  She does complain over last 2 weeks of cough, congestion, sinus drainage and wheezing  No fever, chest pain or orthopnea.  OTC cold meds not working on congestion.  rec Zpack take as directed  Mucinex DM Twice daily   Prednisone taper over next week  Follow med calendar closely and bring to each visit.    01/12/2013 f/u ov/Wert re: dtc  asthma Chief Complaint  Patient presents with  . Follow-up    PFT done today.   Even on prednisone still need albuterol hfa avg once at night and neb saba also w/in 2 h of saba hfa for noct cough/ wheeze but much less sob/wheeze during the day eg with walk then worse in 2 weeks despite dulera 100 2bid.  rec Increase dulera to 200 Take 2 puffs first thing in am and then another 2 puffs about 12 hours later.  I will contact Dr Hilarie Fredrickson regarding your night time spells that don't resolve  even on the prednisone Prednisone 10 mg take  4 each am x 2 days,   2 each am x 2 days,  1 each am x 2 days and stop Please schedule a follow up office visit in 4 weeks, sooner if needed with all meds and inhalers in hand ? Why not use reglan at hs Be sure using mucinex dm and tramadol correctly    02/09/2013 f/u ov/Wert re: dtc asthma, did not bring med calendar Chief Complaint  Patient presents with  . Follow-up    Pt states that her breathing is doing well.  She does use her rescue inhaler approx twice per day and has used neb x 3 in the past wk  Symptoms provoked by walking by around the block but this is an improvement over baseline  Nocturnal episodes sev hours p going to bed "gasping for breath" some better p started reglan at hs  >>dulera , trial off singulair   04/06/2013 Follow up and Med Review   Returns for follow up and med review  We reviewed all her meds and organized them into a med calendar. She does have Medicaid but depends on samples. Did not bring all her meds with her today , says they are at home.  Says her breathing is the same since last ov w/ SOB, Has on /off wheezing, some chest heaviness, prod cough with yellow mucus. Good and bad days. Wears out easily .  Denies f/c/s, nausea, vomiting.  Did not stop singulair as recommended.  rec  Follow med calendar closely and bring to each visit > did not do  We are referring you for a CPSTwith spirometry before and after> never  done   07/14/2013 f/u ov/Wert re: dtc asthma vs vcd / no med calendar/ confused with meds/instructions  Chief Complaint  Patient presents with  . Follow-up    Pt states  her breathing has been worse for the past 6 wks. She states that she feels out of breath all the time. She also states having increased cough x 6 wks- prod with large amounts of tan colored sputum.   not able to verify what she's taking at this point but "nothing works" including saba  >>  07/28/2013 Follow up and Med Review  Patient returns for a followup and medication review. We reviewed all her medications organized them into a medication calendar with patient education. She has many med changes and is confused with several meds. We reviewed all the recent changes and updated her list w/ education.Called her pharmacy to verify/update.   Patient reports that she was recently seen in emergency room on April 27 due to chest pain, dizziness and asthma flare. She was given a Depo-Medrol shot, meclizine. And prednisone taper.   She says that she is feeling better. CP and dizziness resolved.  CXR w/ nad , CT head was neg . She denies any hemoptysis, chest pain, orthopnea, PND, or leg swelling.   Current Medications, Allergies, Complete Past Medical History, Past Surgical History, Family History, and Social History were reviewed in [employer] record.  ROS  The following are not active complaints unless bolded sore throat, dysphagia, dental problems, itching, sneezing,  nasal congestion or excess/ purulent secretions, ear ache,   fever, chills, sweats, unintended wt loss, pleuritic or exertional cp, hemoptysis,  orthopnea pnd or leg swelling, presyncope, palpitations, heartburn, abdominal pain, anorexia, nausea, vomiting, diarrhea  or change in bowel or urinary habits, change in stools or urine, dysuria,hematuria,  rash, arthralgias, visual complaints, headache, numbness weakness or ataxia or problems with  walking or coordination,  change in mood/affect or memory.               Objective:   Physical Exam  Obese bf nad    Wt 213  12/18/10 > 01/03/2011  214 > 01/31/2011  206 >  09/11/2012 209 >208 6/19 > 210 09/25/2012 > 11/20/2012 211 >208 12/07/12> 01/12/2013 209 > 02/09/2013 210 >207 04/06/2013 > 07/14/2013  213>212 07/28/2013   HEENT: nl dentition, turbinates, and orophanx. Nl external ear canals without cough reflex   NECK :  without JVD/Nodes/TM/ nl carotid upstrokes bilaterally   LUNGS: lungs clear bilaterally with good air movement/ mild pseudowheeze   CV:  RRR  no s3 or murmur or increase in P2, no edema   ABD:  soft and nontender with nl excursion in the supine position. No bruits or organomegaly, bowel sounds nl  MS:  warm without deformities, calf tenderness, cyanosis or clubbing    CXR  07/14/2013 :  Cardiomediastinal silhouette is stable. No acute infiltrate or pleural effusion. No pulmonary edema. Bony thorax is unremarkable.    Recent Labs Lab 07/14/13 1300  NA 135  K 3.5  CL 100  CO2 27  BUN 8  CREATININE 1.0  GLUCOSE 90    Recent Labs Lab 07/14/13 1300  HGB 14.8  HCT 43.7  WBC 7.0  PLT 216.0      Lab Results  Component Value Date   TSH 0.90 07/14/2013     Lab Results  Component Value Date   PROBNP 13.0 07/14/2013       Assessment & Plan:

## 2013-07-28 NOTE — Patient Instructions (Signed)
Taper off prednisone as directed.  Follow med calendar closely and bring to each visit.  Follow up Dr. Melvyn Novas  In 3 months and As needed   Please contact office for sooner follow up if symptoms do not improve or worsen or seek emergency care

## 2013-07-28 NOTE — ED Provider Notes (Signed)
CSN: [id number]     Arrival date & time 07/26/13  2349 History   First MD Initiated Contact with Patient 07/27/13 0104     Chief Complaint  Patient presents with  . Chest Pain     (Consider location/radiation/quality/duration/timing/severity/associated sxs/prior Treatment) HPI Patient states she is having several months of chest tightness. This worsened this evening. She's had increased wheezing and productive cough. She describes the tightness and pain in the lower bilateral chest. She's had no increased lower extremity swelling or edema. The chest pain is worse with deep breathing and palpation. Patient also has left arm numbness. This appears to be chronic. She's had a carpal tunnel release in the left wrist several years ago. She denies any fevers or chills. Patient also complains of episodic bouts of dizziness. She describes this as room spinning. This been going on for several days. She currently has no dizziness. She has no focal weakness or numbness. He's had no nausea or vomiting. She does complain of frontal headache and nasal congestion. She denies any hearing changes or pain in the ears. Past Medical History  Diagnosis Date  . Breast cancer 2010  . Asthma   . Hypertension   . GERD (gastroesophageal reflux disease)   . Impaired glucose tolerance 04/13/2011  . Anemia, unspecified 04/13/2011  . Asthma 09/27/2010  . Bloating 03/18/2011  . 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 Lymphadenitis, chronic     restricted LEFT extremity   Past Surgical History  Procedure Laterality Date  . Hand surgery  2012    right; tendon release  . Breast lumpectomy  09/2008    left  . Carpal tunnel release  2011    left hand  . Tubal ligation  05/11/2011    Procedure: POST PARTUM TUBAL LIGATION;  Surgeon: Emeterio Reeve, MD;  Location: [facility];  Service: Gynecology;  Laterality: Bilateral;  Induced for HTN  . Lymph node dissection  2010    left breast; 2 wks after breast lumpectomy   Family History   Problem Relation Age of Onset  . Diabetes Maternal Grandmother   . Heart disease Maternal Grandmother   . Hypertension Maternal Grandmother   . Cancer Neg Hx   . Alcohol abuse Neg Hx   . Early death Neg Hx   . Hyperlipidemia Neg Hx   . Kidney disease Neg Hx   . Stroke Neg Hx   . Colon cancer Neg Hx    History  Substance Use Topics  . Smoking status: Never Smoker   . Smokeless tobacco: Never Used  . Alcohol Use: No     Comment: occasionally   OB History   Grav Para Term Preterm Abortions TAB SAB Ect Mult Living   5 4 4  1  1   4      Review of Systems  Constitutional: Negative for fever and chills.  Respiratory: Positive for cough, shortness of breath and wheezing.   Cardiovascular: Positive for chest pain. Negative for palpitations and leg swelling.  Gastrointestinal: Negative for nausea, vomiting, abdominal pain and diarrhea.  Musculoskeletal: Negative for back pain, myalgias, neck pain and neck stiffness.  Skin: Negative for pallor and rash.  Neurological: Positive for numbness. Negative for dizziness, weakness and headaches.  All other systems reviewed and are negative.     Allergies  Aspirin and Penicillins  Home Medications   Prior to Admission medications   Medication Sig Start Date End Date Taking? Authorizing Provider  acetaZOLAMIDE (DIAMOX) 250 MG tablet Take 2 tablets (  500 mg total) by mouth 2 (two) times daily. 02/05/13  Yes Gavin Pound. Ghim, MD  albuterol (PROVENTIL HFA;VENTOLIN HFA) 108 (90 BASE) MCG/ACT inhaler Inhale 2 puffs into the lungs every 6 (six) hours as needed for wheezing or shortness of breath.   Yes Historical Provider, MD  albuterol (PROVENTIL) (2.5 MG/3ML) 0.083% nebulizer solution Take 3 mLs (2.5 mg total) by nebulization every 4 (four) hours as needed for wheezing or shortness of breath (((PLAN B))). 04/06/13  Yes Tammy S Parrett, NP  amLODipine-olmesartan (AZOR) 10-40 MG per tablet Take 1 tablet by mouth daily. 06/23/13  Yes Lars Masson, MD  cloNIDine (CATAPRES) 0.2 MG tablet Take 1 tablet (0.2 mg total) by mouth 2 (two) times daily. 08/05/12  Yes [name], MD  hyoscyamine (LEVSIN SL) 0.125 MG SL tablet Place 0.125 mg under the tongue every 8 (eight) hours as needed for cramping. 05/19/12  Yes Beverley Fiedler, MD  LORazepam (ATIVAN) 0.5 MG tablet Take 0.5 mg by mouth every 8 (eight) hours as needed for anxiety. 01/07/13  Yes Lowella Dell, MD  metoCLOPramide (REGLAN) 10 MG tablet Take 1 tablet (10 mg total) by mouth 4 (four) times daily -  with meals and at bedtime. 01/26/13  Yes Beverley Fiedler, MD  mometasone-formoterol (DULERA) 200-5 MCG/ACT AERO Inhale 2 puffs into the lungs 2 (two) times daily. 04/06/13  Yes Tammy S Parrett, NP  montelukast (SINGULAIR) 10 MG tablet Take 1 tablet (10 mg total) by mouth at bedtime. 07/14/13  Yes Nyoka Cowden, MD  nebivolol (BYSTOLIC) 10 MG tablet Take 1 tablet (10 mg total) by mouth every morning. 07/15/13  Yes [name], MD  pantoprazole (PROTONIX) 40 MG tablet Take 40 mg by mouth 2 (two) times daily before a meal. 01/07/13  Yes Lowella Dell, MD  potassium chloride SA (K-DUR,KLOR-CON) 20 MEQ tablet Take 20 mEq by mouth 2 (two) times daily. 06/04/12  Yes [name], MD  pregabalin (LYRICA) 50 MG capsule Take 1 capsule (50 mg total) by mouth 3 (three) times daily. 06/23/13  Yes Lars Masson, MD  sucralfate (CARAFATE) 1 G tablet Take 1 tablet (1 g total) by mouth 4 (four) times daily. 03/07/12  Yes Toy Baker, MD  traMADol (ULTRAM) 50 MG tablet Take 50 mg by mouth every 6 (six) hours as needed.   Yes Historical Provider, MD  triamterene-hydrochlorothiazide (MAXZIDE-25) 37.5-25 MG per tablet Take 1 tablet by mouth every morning.   Yes Historical Provider, MD  benzonatate (TESSALON) 100 MG capsule Take 1 capsule (100 mg total) by mouth every 8 (eight) hours. 07/27/13   Loren Racer, MD  meclizine (ANTIVERT) 25 MG tablet Take 1 tablet (25 mg total) by mouth 3 (three) times  daily as needed. 07/27/13   Loren Racer, MD  mometasone (NASONEX) 50 MCG/ACT nasal spray Place 2 sprays into the nose daily. 07/27/13   Loren Racer, MD  oxymetazoline (AFRIN NASAL SPRAY) 0.05 % nasal spray Place 1 spray into both nostrils 2 (two) times daily. 07/27/13   Loren Racer, MD  predniSONE (DELTASONE) 50 MG tablet Take 1 tablet (50 mg total) by mouth daily. 07/27/13   Loren Racer, MD   BP 97/62  Pulse 82  Temp(Src) 98.2 F (36.8 C) (Oral)  Resp 19  Ht 5\' 6"  (1.676 m)  Wt 212 lb (96.163 kg)  BMI 34.23 kg/m2  SpO2 96%  LMP 07/12/2013 Physical Exam  Nursing note and vitals reviewed. Constitutional: She is oriented to  person, place, and time. She appears well-developed and well-nourished. No distress.  HENT:  Head: Normocephalic and atraumatic.  Mouth/Throat: Oropharynx is clear and moist.  Maxillary sinus tenderness to percussion. Bilateral nasal turbinates are edematous  Eyes: EOM are normal. Pupils are equal, round, and reactive to light.  Neck: Normal range of motion. Neck supple.  Cardiovascular: Normal rate and regular rhythm.   Pulmonary/Chest: Effort normal. No respiratory distress. She has wheezes (bilateral expiratory wheezes.). She has no rales. She exhibits tenderness (tenderness palpation of the anterior lower ribs bilaterally).  Abdominal: Soft. Bowel sounds are normal. She exhibits no distension and no mass. There is no tenderness. There is no rebound and no guarding.  Musculoskeletal: Normal range of motion. She exhibits no edema and no tenderness.  No calf swelling or edema.  Neurological: She is alert and oriented to person, place, and time.  5/5 motor in all extremities. Decreased sensation to the left hand.  Skin: Skin is warm and dry. No rash noted. No erythema.  Psychiatric: She has a normal mood and affect. Her behavior is normal.    ED Course  Procedures (including critical care time) Labs Review Labs Reviewed  BASIC METABOLIC PANEL -  Abnormal; Notable for the following:    GFR calc non Af Amer 68 (*)    GFR calc Af Amer 78 (*)    All other components within normal limits  CBC  I-STAT TROPOININ, ED  Randolm Idol, ED    Imaging Review Dg Chest 2 View  07/27/2013   CLINICAL DATA:  CHEST PAIN  EXAM: CHEST  2 VIEW  COMPARISON:  DG CHEST 2 VIEW dated 07/14/2013  FINDINGS: Cardiomediastinal silhouette is unremarkable. The lungs are clear without pleural effusions or focal consolidations. Trachea projects midline and there is no pneumothorax. Soft tissue planes and included osseous structures are non-suspicious. Multiple EKG lines overlie the patient and may obscure subtle underlying pathology.  IMPRESSION: No active cardiopulmonary disease.   Electronically Signed   By: Elon Alas   On: 07/27/2013 01:27   Ct Head Wo Contrast  07/27/2013   CLINICAL DATA:  Left arm numbness  EXAM: CT HEAD WITHOUT CONTRAST  TECHNIQUE: Contiguous axial images were obtained from the base of the skull through the vertex without intravenous contrast.  COMPARISON:  CT HEAD W/O CM dated 02/05/2013  FINDINGS: The ventricles are normal in size and position. There is no intracranial hemorrhage nor intracranial mass effect. There is no evidence of an evolving ischemic infarction. The cerebellum and brainstem are normal in density.  At bone window settings the observed portions of the paranasal sinuses and mastoid air cells are clear. There is no evidence of an acute skull fracture. No lytic nor blastic bony lesion is demonstrated.  IMPRESSION: There is no evidence of an acute ischemic or hemorrhagic event within the brain. No other acute intracranial abnormality is demonstrated.   Electronically Signed   By: Rhianne Soman  [place]   On: 07/27/2013 02:33     EKG Interpretation None      Date: 07/28/2013  Rate: 87  Rhythm: normal sinus rhythm  QRS Axis: normal  Intervals: normal  ST/T Wave abnormalities: normal  Conduction Disutrbances:none  Narrative  Interpretation:   Old EKG Reviewed: unchanged   MDM   Final diagnoses:  Bronchitis with bronchospasm  Atypical chest pain   Patient's dizziness consistent with peripheral vertigo. CT without acute findings. Will treat symptomatically.  Patient without evidence of coronary artery disease. Normal troponin and EKG is without acute  changes. 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 She'll much better after breathing treatment and is requesting discharge home. We'll treat with short course of steroids, and tussive. Return precautions have been given the patient's voice understanding.    Julianne Rice, MD 07/28/13 [phone]

## 2013-07-28 NOTE — ED Notes (Signed)
Pt states she was running to get to her child and hit her toe on the wall  Pt states it is her little toe on her left foot

## 2013-07-29 ENCOUNTER — Emergency Department (HOSPITAL_COMMUNITY): Payer: Medicaid - Out of State

## 2013-07-29 MED ORDER — HYDROCODONE-ACETAMINOPHEN 5-325 MG PO TABS: 2.0000 | ORAL_TABLET | ORAL | Status: DC | PRN

## 2013-07-29 MED ORDER — HYDROCODONE-ACETAMINOPHEN 5-325 MG PO TABS
2.0000 | ORAL_TABLET | Freq: Once | ORAL | Status: AC
  Administered 2013-07-29: 2 via ORAL
  Filled 2013-07-29: qty 2

## 2013-07-29 NOTE — Discharge Instructions (Signed)
Turf Toe Turf toe is a condition of pain at the base of the big toe, located at the ball of the foot. The condition is usually caused from either jamming or extending the toe beyond normal limits (hyperextension). This is the result of pushing off repeatedly when running or jumping. The main problem is pain at the base of the toe, but there may also be stiffness and swelling. The name turf toe comes from the fact that this injury is especially common among athletes who play on hard surfaces, such as artificial turf and basketball courts. Hard surfaces combined with running and jumping makes this a common sports injury. DIAGNOSIS  The diagnosis of turftoeisnotdifficult. It is made by examination. X-rays may be taken to make sure there is nobreak in the bone (fracture). Not doing surgery (conservative treatment) solves the problem most of the time. Conservative treatment includes the following home care instructions. HOME CARE INSTRUCTIONS   Apply ice to the sore area for 15-20 minutes, 03-04 times per day while awake, for the first 4 days. Put the ice in a plastic bag and place a towel between the bag of ice and your skin. Use ice if possible following any activities, even after the first four days.  Keep your leg elevated when possible to lessen swelling and discomfort in the toe.  Use crutches with non-weight bearing on the affected foot for ten days, or as needed for pain. Then you may walk as the pain allows, or as instructed. Start gradually with weight bearing on the affected foot. Shoes with stiff soles will generally be helpful in limiting pain for the first 1 to 2 weeks.  Continue to use crutches or a cane until you can stand on your foot without causing pain.  Only take over-the-counter or prescription medicines for pain, discomfort, or fever as directed by your caregiver. SEEK IMMEDIATE MEDICAL CARE IF:   You have an increase in bruising, swelling, or pain in your toe.  Pain relief is  not obtained with medications. Turf toe can return, and problems may be slow to improve. This is more common if you return to athletic activities too soon and do not allow the problem to fully recover. Surgery is rarely needed, but in certain cases it may be necessary. If a bone spur forms and severely limits motion of the toe joint, surgery to remove the spur and improve motion of the big toe may be helpful. Document Released: 09/07/2001 Document Revised: 06/10/2011 Document Reviewed: 08/23/2008 [facility] Patient Information 2014 [place], [place].

## 2013-07-29 NOTE — ED Provider Notes (Signed)
Medical screening examination/treatment/procedure(s) were performed by non-physician practitioner and as supervising physician I was immediately available for consultation/collaboration.   EKG Interpretation None       Kalman Drape, MD 07/29/13 [phone]

## 2013-07-29 NOTE — ED Provider Notes (Signed)
CSN: [id number]     Arrival date & time 07/28/13  2346 History   First MD Initiated Contact with Patient 07/28/13 2353     Chief Complaint  Patient presents with  . Toe Injury     (Consider location/radiation/quality/duration/timing/severity/associated sxs/prior Treatment) HPI Comments: Patient presents to the ED with a chief complaint of left foot pain.  Patient states that she jammed her foot against the wall earlier tonight.  She has not taken anything for pain.  It is worse with movement and palpation.  It is better with rest.  No associated symptoms.  The pain does not radiate.  The history is provided by the patient. No language interpreter was used.    Past Medical History  Diagnosis Date  . Breast cancer 2010  . Asthma   . Hypertension   . GERD (gastroesophageal reflux disease)   . Impaired glucose tolerance 04/13/2011  . Anemia, unspecified 04/13/2011  . Asthma 09/27/2010  . Bloating 03/18/2011  . 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 Lymphadenitis, chronic     restricted LEFT extremity   Past Surgical History  Procedure Laterality Date  . Hand surgery  2012    right; tendon release  . Breast lumpectomy  09/2008    left  . Carpal tunnel release  2011    left hand  . Tubal ligation  05/11/2011    Procedure: POST PARTUM TUBAL LIGATION;  Surgeon: Emeterio Reeve, MD;  Location: [facility];  Service: Gynecology;  Laterality: Bilateral;  Induced for HTN  . Lymph node dissection  2010    left breast; 2 wks after breast lumpectomy   Family History  Problem Relation Age of Onset  . Diabetes Maternal Grandmother   . Heart disease Maternal Grandmother   . Hypertension Maternal Grandmother   . Cancer Neg Hx   . Alcohol abuse Neg Hx   . Early death Neg Hx   . Hyperlipidemia Neg Hx   . Kidney disease Neg Hx   . Stroke Neg Hx   . Colon cancer Neg Hx    History  Substance Use Topics  . Smoking status: Never Smoker   . Smokeless tobacco: Never Used  . Alcohol Use: No   Comment: occasionally   OB History   Grav Para Term Preterm Abortions TAB SAB Ect Mult Living   5 4 4  1  1   4      Review of Systems  Constitutional: Negative for fever and chills.  Respiratory: Negative for shortness of breath.   Cardiovascular: Negative for chest pain.  Gastrointestinal: Negative for abdominal distention.  Musculoskeletal: Positive for arthralgias.      Allergies  Aspirin and Penicillins  Home Medications   Prior to Admission medications   Medication Sig Start Date End Date Taking? Authorizing Provider  acetaZOLAMIDE (DIAMOX) 250 MG tablet Take 2 tablets (500 mg total) by mouth 2 (two) times daily. 02/05/13   Saddie Benders. Ghim, MD  albuterol (PROVENTIL HFA;VENTOLIN HFA) 108 (90 BASE) MCG/ACT inhaler Inhale 2 puffs into the lungs every 4 (four) hours as needed for wheezing or shortness of breath (((PLAN A))).     Historical Provider, MD  albuterol (PROVENTIL) (2.5 MG/3ML) 0.083% nebulizer solution Take 3 mLs (2.5 mg total) by nebulization every 4 (four) hours as needed for wheezing or shortness of breath (((PLAN B))). 04/06/13   Tammy S Parrett, NP  amLODipine-olmesartan (AZOR) 10-40 MG per tablet Take 1 tablet by mouth daily. 06/23/13   [name], MD  benzonatate (  TESSALON) 100 MG capsule Take 100 mg by mouth every 8 (eight) hours as needed. 07/27/13   Julianne Rice, MD  Biotin 1000 MCG tablet Take 1,000 mcg by mouth daily.    Historical Provider, MD  cloNIDine (CATAPRES) 0.2 MG tablet Take 1 tablet (0.2 mg total) by mouth 2 (two) times daily. 08/05/12   [name], MD  dextromethorphan-guaiFENesin [facility] DM) 30-600 MG per 12 hr tablet Take 1 tablet by mouth every 12 (twelve) hours as needed for cough.    Historical Provider, MD  hyoscyamine (LEVSIN SL) 0.125 MG SL tablet Place 0.125 mg under the tongue every 8 (eight) hours as needed for cramping. 05/19/12   Jerene Bears, MD  LORazepam (ATIVAN) 0.5 MG tablet Take 0.5 mg by mouth every 8 (eight) hours as  needed for anxiety. 01/07/13   Chauncey Cruel, MD  metoCLOPramide (REGLAN) 10 MG tablet Take 1 tablet (10 mg total) by mouth 4 (four) times daily -  with meals and at bedtime. 01/26/13   Jerene Bears, MD  mometasone (NASONEX) 50 MCG/ACT nasal spray Place 2 sprays into the nose daily. 07/27/13   Julianne Rice, MD  mometasone-formoterol [facility]) 200-5 MCG/ACT AERO Inhale 2 puffs into the lungs 2 (two) times daily. 04/06/13   Tammy S Parrett, NP  montelukast (SINGULAIR) 10 MG tablet Take 1 tablet (10 mg total) by mouth at bedtime. 07/14/13   Tanda Rockers, MD  nebivolol (BYSTOLIC) 10 MG tablet Take 1 tablet (10 mg total) by mouth every morning. 07/15/13   [name], MD  pantoprazole (PROTONIX) 40 MG tablet Take 40 mg by mouth 2 (two) times daily before a meal. 01/07/13   Chauncey Cruel, MD  potassium chloride SA (K-DUR,KLOR-CON) 20 MEQ tablet Take 20 mEq by mouth 2 (two) times daily. 06/04/12   [name], MD  predniSONE (DELTASONE) 50 MG tablet Take 1 tablet (50 mg total) by mouth daily. 07/27/13   Julianne Rice, MD  pregabalin (LYRICA) 50 MG capsule Take 1 capsule (50 mg total) by mouth 3 (three) times daily. 06/23/13   [name], MD  sucralfate (CARAFATE) 1 G tablet Take 1 tablet (1 g total) by mouth 4 (four) times daily. 03/07/12   [name], MD  traMADol (ULTRAM) 50 MG tablet 1-2 every 4 hours as needed for cough or pain    Historical Provider, MD  triamterene-hydrochlorothiazide (MAXZIDE-25) 37.5-25 MG per tablet Take 1 tablet by mouth every morning.    Historical Provider, MD   BP 139/79  Pulse 95  Temp(Src) 98 F (36.7 C) (Oral)  Resp 20  SpO2 98%  LMP 07/12/2013 Physical Exam  Nursing note and vitals reviewed. Constitutional: She is oriented to person, place, and time. She appears well-developed and well-nourished.  HENT:  Head: Normocephalic and atraumatic.  Eyes: Conjunctivae and EOM are normal.  Neck: Normal range of motion.  Cardiovascular: Normal rate and  intact distal pulses.   Pulmonary/Chest: Effort normal.  Abdominal: She exhibits no distension.  Musculoskeletal: Normal range of motion.  Left small toe, tender to palpation, no bony abnormality or deformity, ROM and strength reduced 2/2 pain  Neurological: She is alert and oriented to person, place, and time.  Normal sensation  Skin: Skin is dry.  Psychiatric: She has a normal mood and affect. Her behavior is normal. Judgment and thought content normal.    ED Course  Procedures (including critical care time) Labs Review Labs Reviewed - No data to display  Imaging  Review Dg Chest 2 View  07/27/2013   CLINICAL DATA:  CHEST PAIN  EXAM: CHEST  2 VIEW  COMPARISON:  DG CHEST 2 VIEW dated 07/14/2013  FINDINGS: Cardiomediastinal silhouette is unremarkable. The lungs are clear without pleural effusions or focal consolidations. Trachea projects midline and there is no pneumothorax. Soft tissue planes and included osseous structures are non-suspicious. Multiple EKG lines overlie the patient and may obscure subtle underlying pathology.  IMPRESSION: No active cardiopulmonary disease.   Electronically Signed   By: Elon Alas   On: 07/27/2013 01:27   Ct Head Wo Contrast  07/27/2013   CLINICAL DATA:  Left arm numbness  EXAM: CT HEAD WITHOUT CONTRAST  TECHNIQUE: Contiguous axial images were obtained from the base of the skull through the vertex without intravenous contrast.  COMPARISON:  CT HEAD W/O CM dated 02/05/2013  FINDINGS: The ventricles are normal in size and position. There is no intracranial hemorrhage nor intracranial mass effect. There is no evidence of an evolving ischemic infarction. The cerebellum and brainstem are normal in density.  At bone window settings the observed portions of the paranasal sinuses and mastoid air cells are clear. There is no evidence of an acute skull fracture. No lytic nor blastic bony lesion is demonstrated.  IMPRESSION: There is no evidence of an acute ischemic or  hemorrhagic event within the brain. No other acute intracranial abnormality is demonstrated.   Electronically Signed   By: David  [place]   On: 07/27/2013 02:33     EKG Interpretation None      MDM   Final diagnoses:  Toe pain    Patient with sprain. Will treat with postop shoe, and give some pain medicine. Recommend PCP follow up.. Patient is stable for discharge.   Montine Circle, PA-C 07/29/13 [phone]

## 2013-08-24 ENCOUNTER — Encounter: Payer: Self-pay | Admitting: Neurology

## 2013-08-24 ENCOUNTER — Ambulatory Visit (INDEPENDENT_AMBULATORY_CARE_PROVIDER_SITE_OTHER): Payer: Self-pay | Admitting: Neurology

## 2013-08-24 ENCOUNTER — Encounter: Payer: Self-pay | Admitting: Cardiology

## 2013-08-24 ENCOUNTER — Ambulatory Visit (INDEPENDENT_AMBULATORY_CARE_PROVIDER_SITE_OTHER): Payer: Medicaid - Out of State | Admitting: Cardiology

## 2013-08-24 VITALS — BP 130/70 | HR 109 | Ht 65.0 in | Wt 213.0 lb

## 2013-08-24 VITALS — BP 140/88 | HR 110 | Ht 64.96 in | Wt 214.6 lb

## 2013-08-24 DIAGNOSIS — I10 Essential (primary) hypertension: Secondary | ICD-10-CM

## 2013-08-24 DIAGNOSIS — R209 Unspecified disturbances of skin sensation: Secondary | ICD-10-CM

## 2013-08-24 DIAGNOSIS — I5032 Chronic diastolic (congestive) heart failure: Secondary | ICD-10-CM

## 2013-08-24 DIAGNOSIS — R0989 Other specified symptoms and signs involving the circulatory and respiratory systems: Secondary | ICD-10-CM

## 2013-08-24 DIAGNOSIS — I509 Heart failure, unspecified: Secondary | ICD-10-CM

## 2013-08-24 DIAGNOSIS — G620 Drug-induced polyneuropathy: Secondary | ICD-10-CM

## 2013-08-24 DIAGNOSIS — R002 Palpitations: Secondary | ICD-10-CM

## 2013-08-24 DIAGNOSIS — R0609 Other forms of dyspnea: Secondary | ICD-10-CM

## 2013-08-24 DIAGNOSIS — T451X5A Adverse effect of antineoplastic and immunosuppressive drugs, initial encounter: Secondary | ICD-10-CM

## 2013-08-24 DIAGNOSIS — G932 Benign intracranial hypertension: Secondary | ICD-10-CM

## 2013-08-24 DIAGNOSIS — R0600 Dyspnea, unspecified: Secondary | ICD-10-CM

## 2013-08-24 MED ORDER — NITROGLYCERIN 0.4 MG SL SUBL: 0.4000 mg | SUBLINGUAL_TABLET | SUBLINGUAL | Status: DC | PRN

## 2013-08-24 MED ORDER — PREGABALIN 100 MG PO CAPS: 100.0000 mg | ORAL_CAPSULE | Freq: Two times a day (BID) | ORAL | Status: DC

## 2013-08-24 MED ORDER — NEBIVOLOL HCL 5 MG PO TABS: 5.0000 mg | ORAL_TABLET | Freq: Every day | ORAL | Status: DC

## 2013-08-24 MED ORDER — CLONIDINE HCL 0.3 MG PO TABS: 0.3000 mg | ORAL_TABLET | Freq: Two times a day (BID) | ORAL | Status: DC

## 2013-08-24 NOTE — Patient Instructions (Signed)
1.  Check vitamin B12, copper 2.  Start taking Lyrica 100mg  twice daily 3.  Call the office in 2 weeks with an update 4.  Return clinic 6-weeks

## 2013-08-24 NOTE — Progress Notes (Signed)
[facility] Clinic Note - Initial Visit   Date: 08/24/2013  Erica [name] MRN: [id number] DOB: 1976-02-08   Dear Dr [name]:  Thank you for your kind referral of Erica [name] for consultation of numbness/tingling. Although her history is well known to you, please allow [place] to reiterate it for the purpose of our medical record. The patient was accompanied to the clinic by self who also provides collateral information.     History of Present Illness: Erica [name] is a 38 y.o. 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  Starting in 2010 after her second course of chemotherapy she developed numbness/tingling of the arm and legs, described as her hands are asleep. She has burning sensation and shaking of her hands.  Activity worsens pain and she has difficulty with fine motor tasks such as buttoning, tying shoe laces, and braiding her daughter's hair. She falls about once per month because her legs give out.  She was started on Lyrica 50mg  TID around March which has helped slightly.    Of note, she history of pseudotumor cerebri, followed by ophthalmology, and currently takes diamox 500mg  BID. 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  She had lumbar puncture performed in the seated position with documented OP of 43 and 32cc of fluid removed. She had a second lumbar puncture in 2014 at which time OP was 18 mmHg.   Out-side paper records, electronic medical record, and images have been reviewed where available and summarized as: Lab Results  Component Value Date   TSH 0.90 07/14/2013   Lab Results    Component Value Date   VITAMINB12 725 09/18/2011   04/10/2011:  1hr GTT - normal  Lab Results  Component Value Date   CREATININE 1.03 07/27/2013    CSF 09/18/2011:  R269  W0 G65  P17, CSF cytology-neg  OP 43, 32cc removed LP 02/05/2013:  OP 18, CP 17 after 21 cc removed Labs 09/17/2011:  vitamin B12 725, RPR NR CT head 02/05/2013:  No acute abnormality  MRI brain 08/13/2010: 1. No evidence for metastatic disease to the brain.  2. Scattered subcortical T2 and FLAIR hyperintensities are slightly greater than expected for age. The finding is nonspecific  but can be seen in the setting of chronic microvascular ischemia, a demyelinating process such as multiple sclerosis, vasculitis, complicated migraine headaches, or as the sequelae of a prior infectious or inflammatory process. The lesions could be related to prior brain radiation if this has been performed.  Past Medical History  Diagnosis Date  . Breast cancer 2010  . Asthma   . Hypertension   . GERD (gastroesophageal reflux disease)   . Impaired glucose tolerance 04/13/2011  . Anemia, unspecified 04/13/2011  . Asthma 09/27/2010  . Bloating 03/18/2011  . 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 Lymphadenitis, chronic     restricted LEFT extremity    Past Surgical History  Procedure Laterality Date  . Hand surgery  2012    right; tendon release  . Breast lumpectomy  09/2008    left  . Carpal tunnel release  2011    left hand  . Tubal ligation  05/11/2011    Procedure: POST PARTUM TUBAL LIGATION;  Surgeon: Emeterio Reeve, MD;  Location: [facility];  Service: Gynecology;  Laterality: Bilateral;  Induced for HTN  . Lymph node dissection  2010    left breast; 2 wks after breast lumpectomy     Medications:  Current Outpatient Prescriptions on File Prior to Visit  Medication Sig Dispense Refill  . acetaZOLAMIDE (DIAMOX) 250 MG tablet Take 2 tablets (500 mg total) by mouth 2 (two) times daily.  60 tablet  0  . albuterol (PROVENTIL HFA;VENTOLIN  HFA) 108 (90 BASE) MCG/ACT inhaler Inhale 2 puffs into the lungs every 4 (four) hours as needed for wheezing or shortness of breath (((PLAN A))).       Marland Kitchen albuterol (PROVENTIL) (2.5 MG/3ML) 0.083% nebulizer solution Take 3 mLs (2.5 mg total) by nebulization every 4 (four) hours as needed for wheezing or shortness of breath (((PLAN B))).  300 mL  5  . amLODipine-olmesartan (AZOR) 10-40 MG per tablet Take 1 tablet by mouth daily.  30 tablet  6  . Biotin 1000 MCG tablet Take 1,000 mcg by mouth daily.      Marland Kitchen dextromethorphan-guaiFENesin (MUCINEX DM) 30-600 MG per 12 hr tablet Take 1 tablet by mouth every 12 (twelve) hours as needed for cough.      . hyoscyamine (LEVSIN SL) 0.125 MG SL tablet Place 0.125 mg under the tongue every 8 (eight) hours as needed for cramping.      Marland Kitchen LORazepam (ATIVAN) 0.5 MG tablet Take 0.5 mg by mouth every 8 (eight) hours as needed for anxiety.      . metoCLOPramide (REGLAN) 10 MG tablet Take 1 tablet (10 mg total) by mouth 4 (four) times daily -  with meals and at bedtime.  120 tablet  3  . mometasone (NASONEX) 50 MCG/ACT nasal spray Place 2 sprays into the nose daily.  17 g  12  . mometasone-formoterol (DULERA) 200-5 MCG/ACT AERO Inhale 2 puffs into the lungs 2 (two) times daily.  13 g  5  . montelukast (SINGULAIR) 10 MG tablet Take 1 tablet (10 mg total) by mouth at bedtime.  30 tablet  5  . pantoprazole (PROTONIX) 40 MG tablet Take 40 mg by mouth 2 (two) times daily before a meal.      . potassium chloride SA (K-DUR,KLOR-CON) 20 MEQ tablet Take 20 mEq by mouth 2 (two) times daily.      . sucralfate (CARAFATE) 1 G tablet Take 1 tablet (1 g total) by mouth 4 (four) times daily.  30 tablet  0  . traMADol (ULTRAM) 50 MG tablet 1-2 every 4 hours as needed for cough or pain      . triamterene-hydrochlorothiazide (MAXZIDE-25) 37.5-25 MG per tablet Take 1 tablet by mouth every morning.      . cloNIDine (CATAPRES) 0.3 MG tablet Take 1 tablet (0.3 mg total) by mouth 2 (two) times  daily.  90 tablet  3  . nebivolol (BYSTOLIC) 5 MG tablet Take 1 tablet (5 mg total) by mouth daily.  30 tablet  3  . nitroGLYCERIN (NITROSTAT) 0.4 MG SL tablet Place 1 tablet (0.4 mg total) under the tongue every 5 (five) minutes as needed for chest pain.  90 tablet  3   No current facility-administered medications on file prior to visit.    Allergies:  Allergies  Allergen Reactions  . Aspirin Shortness Of Breath and Palpitations    Pt can take ibuprofen without reaction  . Penicillins Shortness Of Breath and Palpitations    Family History: Family History  Problem Relation Age of Onset  .  Diabetes Maternal Grandmother   . Heart disease Maternal Grandmother   . Hypertension Maternal Grandmother   . Cancer Neg Hx   . Alcohol abuse Neg Hx   . Early death Neg Hx   . Hyperlipidemia Neg Hx   . Kidney disease Neg Hx   . Stroke Neg Hx   . Colon cancer Neg Hx     Social History: History   Social History  . Marital Status: Single    Spouse Name: N/A    Number of Children: 3  . Years of Education: N/A   Occupational History  .     Social History Main Topics  . Smoking status: Never Smoker   . Smokeless tobacco: Never Used  . Alcohol Use: 0.0 oz/week     Comment: occasionally  . Drug Use: No  . Sexual Activity: Yes    Birth Control/ Protection: Surgical   Other Topics Concern  . Not on file   Social History Narrative   She lives with four children.   She is currently not working (disbaility pending).          Review of Systems:  CONSTITUTIONAL: No fevers, chills, night sweats, or weight loss. EYES: +visual changes, no eye pain ENT: No hearing changes.  No history of nose bleeds.   RESPIRATORY: No cough, wheezing and shortness of breath.   CARDIOVASCULAR: Negative for chest pain, and palpitations.   GI: Negative for abdominal discomfort, blood in stools or black stools.  No recent change in bowel habits.   GU:  No history of incontinence.   MUSCLOSKELETAL: No  history of joint pain +swelling.  +myalgias.   SKIN: Negative for lesions, rash, and itching.   HEMATOLOGY/ONCOLOGY: Negative for prolonged bleeding, bruising easily, and swollen nodes.  + history of cancer.   ENDOCRINE: Negative for cold or heat intolerance, polydipsia or goiter.   PSYCH:  No depression or anxiety symptoms.   NEURO: As Above.   Vital Signs:  BP 140/88  Pulse 110  Ht 5' 4.96" (1.65 m)  Wt 214 lb 9 oz (97.325 kg)  BMI 35.75 kg/m2  SpO2 96%   General Medical Exam:   General:  Well appearing, comfortable.   Eyes/ENT: injected sclera, see cranial nerve examination.   Neck: No masses appreciated.  Full range of motion without tenderness.  No carotid bruits. Respiratory:  Clear to auscultation, good air entry bilaterally.   Cardiac:  Regular rate and rhythm, no murmur.   Back:  No pain to palpation of spinous processes.   Extremities:  Mild left arm lymphedema.   Skin:  Skin color, texture, turgor normal. No rashes or lesions.  Neurological Exam: MENTAL STATUS including orientation to time, place, person, recent and remote memory, attention span and concentration, language, and fund of knowledge is normal.  Speech is not dysarthric.  CRANIAL NERVES: II:  Blinks to threat on right side visual field testing.  Left visual field intact. Mild right optic nerve atrophy, left fundoscopic exam is normal.  III-IV-VI: Pupils equal round and reactive to light.  Normal conjugate, extra-ocular eye movements in all directions of gaze.  No nystagmus.  No ptosis.   V:  Normal facial sensation.    VII:  Normal facial symmetry and movements.  No pathologic facial reflexes.  VIII:  Normal hearing and vestibular function.   IX-X:  Normal palatal movement.   XI:  Normal shoulder shrug and head rotation.   XII:  Normal tongue strength and range of motion, no deviation or  fasciculation.  MOTOR:  Left hand intention tremor, low amplitude, medium frequency, intermittent. Nofasciculations.   No pronator drift.  Tone is normal.    Right Upper Extremity:    Left Upper Extremity:    Deltoid  5/5   Deltoid  5/5   Biceps  5/5   Biceps  5/5   Triceps  5/5   Triceps  5/5   Wrist extensors  5/5   Wrist extensors  5/5   Wrist flexors  5/5   Wrist flexors  5/5   Finger extensors  5/5   Finger extensors  5/5   Finger flexors  5/5   Finger flexors  5/5   Dorsal interossei  5/5   Dorsal interossei  5/5   Abductor pollicis  5/5   Abductor pollicis  5/5   Tone (Ashworth scale)  0  Tone (Ashworth scale)  0   Right Lower Extremity:    Left Lower Extremity:    Hip flexors  5/5   Hip flexors  5/5   Hip extensors  5/5   Hip extensors  5/5   Knee flexors  5/5   Knee flexors  5/5   Knee extensors  5/5   Knee extensors  5/5   Dorsiflexors  5/5   Dorsiflexors  5/5   Plantarflexors  5/5   Plantarflexors  5/5   Toe extensors  5/5   Toe extensors  5/5   Toe flexors  5/5   Toe flexors  5/5   Tone (Ashworth scale)  0  Tone (Ashworth scale)  0   MSRs:  Right                                                                 Left brachioradialis 2+  brachioradialis 2+  biceps 2+  biceps 2+  triceps 2+  triceps 2+  patellar 2+  patellar 2+  ankle jerk 2+  ankle jerk 2+  Hoffman no  Hoffman no  plantar response down  plantar response down   SENSORY:  Normal and symmetric perception of light touch, pinprick, vibration, and proprioception.  Romberg's sign absent.   COORDINATION/GAIT: Normal finger-to- nose-finger and heel-to-shin.  Intact rapid alternating movements bilaterally.  Able to rise from a chair without using arms.  Gait narrow based and stable. Tandem and stressed gait intact.    IMPRESSION: Erica [name] is a 38 year-old female presenting for evaluation for paresthesias of her hands and feet which started following chemotherapy with docetaxel/cyclophosphamide (2010).  She has predominately small fiber symptoms of pain and tingling and seems to have mild improvement with Lyrica.  As she is  tolerating it well, I will further optimize the dose to Lyrica 100mg  BID.  For completeness, I'll check vitamin B12 and copper levels to be sure other secondary causes of neuropathy have been evaluated.  She already had normal glucose tolerance test in the past.  Regarding her pseudotumor cerebri, she is clinically stable and diamox 500mg  BID can be continued.   She was instructed to call my office in 2 weeks with an update to determine further titration of Lyrica and I'll see her back in 42-months.   The duration of this appointment visit was 40 minutes of face-to-face time with the patient.  Greater than 50% of  this time was spent in counseling, explanation of diagnosis, planning of further management, and coordination of care.   Thank you for allowing me to participate in patient's care.  If I can answer any additional questions, I would be pleased to do so.    Sincerely,    Donika K. Posey Pronto, DO

## 2013-08-24 NOTE — Patient Instructions (Signed)
Your physician has recommended you make the following change in your medication:   START TAKING BYSTOLIC 5 MG DAILY   INCREASE YOUR CLONIDINE TO 0.3 MG TWO TIMES DAILY  YOUR MD HAS PRESCRIBED YOU NITROGLYCERIN SUBLINGUAL (UNDER THE TONGUE)  TAKE 1 TAB UNDER THE TONGUE EVERY 5 MINUTES FOR CHEST PAIN. TAKE NO MORE THAT 3 TABS UNDER THE TONGUE- IF NO RELIEF BY THEN CALL 911.  Your physician has recommended that you wear a 48 HOUR holter monitor. Holter monitors are medical devices that record the heart's electrical activity. Doctors most often use these monitors to diagnose arrhythmias. Arrhythmias are problems with the speed or rhythm of the heartbeat. The monitor is a small, portable device. You can wear one while you do your normal daily activities. This is usually used to diagnose what is causing palpitations/syncope (passing out).  Your physician recommends that you schedule a follow-up appointment in: [place]

## 2013-08-24 NOTE — Progress Notes (Signed)
Patient ID: Erica [name], female   DOB: 05-01-1975, 38 y.o.   MRN: [id number]    Patient Name: Erica [name] Date of Encounter: 08/24/2013  Primary Care Provider:  Scarlette Calico, MD Primary Cardiologist:  Erica [name]  Patient Profile  Chest pain  Problem List   Past Medical History  Diagnosis Date  . Breast cancer 2010  . Asthma   . Hypertension   . GERD (gastroesophageal reflux disease)   . Impaired glucose tolerance 04/13/2011  . Anemia, unspecified 04/13/2011  . Asthma 09/27/2010  . Bloating 03/18/2011  . 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 Lymphadenitis, chronic     restricted LEFT extremity   Past Surgical History  Procedure Laterality Date  . Hand surgery  2012    right; tendon release  . Breast lumpectomy  09/2008    left  . Carpal tunnel release  2011    left hand  . Tubal ligation  05/11/2011    Procedure: POST PARTUM TUBAL LIGATION;  Surgeon: Emeterio Reeve, MD;  Location: [facility];  Service: Gynecology;  Laterality: Bilateral;  Induced for HTN  . Lymph node dissection  2010    left breast; 2 wks after breast lumpectomy    Allergies  Allergies  Allergen Reactions  . Aspirin Shortness Of Breath and Palpitations    Pt can take ibuprofen without reaction  . Penicillins Shortness Of Breath and Palpitations    HPI  38 year old female with h/o asthma, obesity, hypertension, who is coming with complaints of chest pain. This pleasant young female has a history of locally advanced left breast carcinoma diagnosed in 2010 and treated with neoadjuvant chemotherapy with docetaxel and cyclophosphamide as well as paclitaxel. This was followed by radiation therapy which was completed in 2011. The patient complains of chest pain it is not exertional, it is locally located in the retrosternal and left epigastric region. It has been happening anytime of the day, and can last for minutes. There is no clear aggravating or alleviating factor. The patient states that ever since  her chemotherapy she gets short of breath with minimal activity and states that she will be able to walk a block.  Follow up after 4 month, no change in her symptoms, she still has exertional shortness of breath and fatigue. She also has a paroxysmal chest pain "heart squeezing" associated with SOB, alleviated by lorazepam and inhalors.   Home Medications  Prior to Admission medications   Medication Sig Start Date End Date Taking? Authorizing Provider  albuterol (PROVENTIL HFA;VENTOLIN HFA) 108 (90 BASE) MCG/ACT inhaler Inhale 2 puffs into the lungs every 4 (four) hours as needed for wheezing or shortness of breath (((PLAN A))).     Historical Provider, MD  albuterol (PROVENTIL) (2.5 MG/3ML) 0.083% nebulizer solution Take 2.5 mg by nebulization every 4 (four) hours as needed for wheezing or shortness of breath (((PLAN B))). For shortness of breath. 05/07/12   Amy Milda Smart, PA-C  amLODipine-olmesartan (AZOR) 5-40 MG per tablet Take 1 tablet by mouth daily. 08/05/12   [name], MD  cloNIDine (CATAPRES) 0.2 MG tablet Take 1 tablet (0.2 mg total) by mouth 2 (two) times daily. 08/05/12   [name], MD  hyoscyamine (LEVSIN SL) 0.125 MG SL tablet Place 0.125 mg under the tongue every 8 (eight) hours as needed for cramping. 05/19/12   Jerene Bears, MD  LORazepam (ATIVAN) 0.5 MG tablet Take 1 tablet (0.5 mg total) by mouth at bedtime as needed for  anxiety. 01/07/13   Chauncey Cruel, MD  metoCLOPramide (REGLAN) 10 MG tablet Take 1 tablet (10 mg total) by mouth 4 (four) times daily -  with meals and at bedtime. 01/14/13   Jerene Bears, MD  mometasone-formoterol (DULERA) 100-5 MCG/ACT AERO Inhale 2 puffs into the lungs 2 (two) times daily. 05/07/12   Amy Milda Smart, PA-C  montelukast (SINGULAIR) 10 MG tablet Take 1 tablet (10 mg total) by mouth at bedtime. 12/31/11   Chauncey Cruel, MD  nebivolol (BYSTOLIC) 10 MG tablet Take 10 mg by mouth at bedtime.    Historical Provider, MD  pantoprazole (PROTONIX) 40 MG  tablet Take 1 tablet (40 mg total) by mouth daily. 01/07/13   Chauncey Cruel, MD  potassium chloride SA (K-DUR,KLOR-CON) 20 MEQ tablet Take 20 mEq by mouth 2 (two) times daily. 06/04/12   [name], MD  sucralfate (CARAFATE) 1 G tablet Take 1 tablet (1 g total) by mouth 4 (four) times daily. 03/07/12   [name], MD  traMADol Veatrice Bourbon) 50 MG tablet 1-2 tabs by mouth every 4 hours as needed for cough or pain 09/10/12   Amy Milda Smart, PA-C  triamterene-hydrochlorothiazide (MAXZIDE-25) 37.5-25 MG per tablet Take 1 tablet by mouth every morning.    Historical Provider, MD    Family History  Family History  Problem Relation Age of Onset  . Diabetes Maternal Grandmother   . Heart disease Maternal Grandmother   . Hypertension Maternal Grandmother   . Cancer Neg Hx   . Alcohol abuse Neg Hx   . Early death Neg Hx   . Hyperlipidemia Neg Hx   . Kidney disease Neg Hx   . Stroke Neg Hx   . Colon cancer Neg Hx     Social History  History   Social History  . Marital Status: Single    Spouse Name: N/A    Number of Children: 3  . Years of Education: N/A   Occupational History  .     Social History Main Topics  . Smoking status: Never Smoker   . Smokeless tobacco: Never Used  . Alcohol Use: 0.0 oz/week     Comment: occasionally  . Drug Use: No  . Sexual Activity: Yes    Birth Control/ Protection: Surgical   Other Topics Concern  . Not on file   Social History Narrative   She lives with four children.   She is currently not working (disbaility pending).           Review of Systems General:  No chills, fever, night sweats or weight changes.  Cardiovascular:  No chest pain, dyspnea on exertion, edema, orthopnea, palpitations, paroxysmal nocturnal dyspnea. Dermatological: No rash, lesions/masses Respiratory: No cough, dyspnea Urologic: No hematuria, dysuria Abdominal:   No nausea, vomiting, diarrhea, bright red blood per rectum, melena, or hematemesis Neurologic:  No  visual changes, wkns, changes in mental status. All other systems reviewed and are otherwise negative except as noted above.  Physical Exam There were no vitals taken for this visit.  General: Pleasant, NAD Psych: Normal affect. Neuro: Alert and oriented X 3. Moves all extremities spontaneously. HEENT: Normal  Neck: Supple without bruits or JVD. Lungs:  Resp regular and unlabored, CTA. Heart: RRR no s3, s4, or murmurs. Abdomen: Soft, non-tender, non-distended, BS + x 4.  Extremities: No clubbing, cyanosis or edema. DP/PT/Radials 2+ and equal bilaterally.  Lipid Panel     Component Value Date/Time   CHOL 196 02/16/2013 1243  TRIG 97.0 02/16/2013 1243   HDL 47.30 02/16/2013 1243   CHOLHDL 4 02/16/2013 1243   VLDL 19.4 02/16/2013 1243   LDLCALC 129* 02/16/2013 1243   Nuclear stress test: 02/15/2013 Impression  Exercise Capacity: Lexiscan with no exercise.  BP Response: Hypertensive blood pressure response.  Clinical Symptoms: Chest pain, headache.  ECG Impression: No significant ST segment change suggestive of ischemia.  Comparison with Prior Nuclear Study: No images to compare  Overall Impression: Normal stress nuclear study.  LV Ejection Fraction: 54%. LV Wall Motion: NL LV Function; NL Wall Motion  Loralie Champagne  02/12/2013   TTE 06/17/2013  Left ventricle: The cavity size was normal. There was mild concentric hypertrophy. Systolic function was normal. The estimated ejection fraction was in the range of 60% to 65%. Wall motion was normal; there were no regional wall motion abnormalities. Features are consistent with a pseudonormal left ventricular filling pattern, with concomitant abnormal relaxation and increased filling pressure (grade 2 diastolic dysfunction). Doppler parameters are consistent with high ventricular filling pressure. Impressions: - When compared to 2010, EF has improved.  Accessory Clinical Findings  ECG - normal sinus rhythm, 100 beats per  minute, normal EKG.    Assessment & Plan  38 year old female with prior medical history of hyperlipidemia, hypertension and breast carcinoma with chemotherapy that can potentially cause heart block, heart failure and angina.  1. Paroxysmal SOB - we will decrease Bystolic to 5 mg po daily, increase Clonidine to 0.3 mg po BID, add PRN sl NTG. Start Holter monitor for 48 hours to rule out arrhythmia as a cause. Encouraged to exercise daily.  2. Chronic diastolic CHF, associated with dyspnea on exertion - A lexiscan nuclear stress test showed poor exercise capacity, LV EF 54% and no perfusion defect.  Echocardiogram showed preserved LV function it is improved compared to prior echo however she has pseudo-normal pattern of diastolic dysfunction with elevated filling pressures. We'll need to be more aggressive on blood pressure management.   3. HTN - BP elevated on 3 consecutive visits, we will increase amlodipine to 10 mg daily. I verified that by Bystolic is beating at 1 receptor blocker with nitric oxide potentiating vasodilator that should actually help in asthma, therefore will continue.  4. Lipid profile - LDL 129 just recommend exercise and diet.  5. 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  Erica Spark, MD 08/24/2013, 3:22 PM

## 2013-08-30 ENCOUNTER — Encounter: Payer: Self-pay | Admitting: Internal Medicine

## 2013-09-02 ENCOUNTER — Ambulatory Visit (INDEPENDENT_AMBULATORY_CARE_PROVIDER_SITE_OTHER): Payer: Medicaid - Out of State | Admitting: Internal Medicine

## 2013-09-02 ENCOUNTER — Encounter: Payer: Self-pay | Admitting: *Deleted

## 2013-09-02 ENCOUNTER — Encounter: Payer: Self-pay | Admitting: Internal Medicine

## 2013-09-02 ENCOUNTER — Other Ambulatory Visit (INDEPENDENT_AMBULATORY_CARE_PROVIDER_SITE_OTHER): Payer: Medicaid - Out of State

## 2013-09-02 ENCOUNTER — Encounter (INDEPENDENT_AMBULATORY_CARE_PROVIDER_SITE_OTHER): Payer: Medicaid - Out of State

## 2013-09-02 VITALS — BP 160/66 | HR 112 | Ht 66.0 in | Wt 214.8 lb

## 2013-09-02 DIAGNOSIS — R110 Nausea: Secondary | ICD-10-CM

## 2013-09-02 DIAGNOSIS — K219 Gastro-esophageal reflux disease without esophagitis: Secondary | ICD-10-CM

## 2013-09-02 DIAGNOSIS — R1013 Epigastric pain: Secondary | ICD-10-CM

## 2013-09-02 DIAGNOSIS — R0600 Dyspnea, unspecified: Secondary | ICD-10-CM

## 2013-09-02 DIAGNOSIS — I10 Essential (primary) hypertension: Secondary | ICD-10-CM

## 2013-09-02 DIAGNOSIS — K3184 Gastroparesis: Secondary | ICD-10-CM

## 2013-09-02 DIAGNOSIS — I5032 Chronic diastolic (congestive) heart failure: Secondary | ICD-10-CM

## 2013-09-02 DIAGNOSIS — R002 Palpitations: Secondary | ICD-10-CM

## 2013-09-02 LAB — HCG, QUANTITATIVE, PREGNANCY: hCG, Beta Chain, Quant, S: 0.71 m[IU]/mL

## 2013-09-02 MED ORDER — PANTOPRAZOLE SODIUM 40 MG PO TBEC: 40.0000 mg | DELAYED_RELEASE_TABLET | Freq: Two times a day (BID) | ORAL | Status: DC

## 2013-09-02 MED ORDER — HYOSCYAMINE SULFATE 0.125 MG SL SUBL: 0.1250 mg | SUBLINGUAL_TABLET | Freq: Three times a day (TID) | SUBLINGUAL | Status: DC | PRN

## 2013-09-02 NOTE — Progress Notes (Signed)
Subjective:    Patient ID: Erica [name], female    DOB: 27-Oct-1975, 38 y.o.   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 She is here alone today. She was last seen in October after starting Reglan for gastroparesis. GEN upper endoscopy on 12/10/2012 which showed retained gastric contents. Gastric biopsies were negative for infection or overt inflammation. She was doing very well with improvement in her upper abdominal discomfort and nausea with Reglan.  Today she reports that Reglan definitely has helped but she is having more worse today than before. She describes "random" upper abdominal sharp discomfort which is located in the epigastrium and occasionally right upper quadrant. She is trying to follow the gastroparesis diet on most days. There doesn't seem to be any certain food trigger to her upper abdominal symptoms. She is having nausea associated with the upper abdominal discomfort but not vomiting. She denies fevers or chills. No jaundice, itching. She has run out of pantoprazole the last several days and feels that this medicine also helps. She was taking 40 mg twice daily. She reports a stable weight, and indeed her weight is unchanged 1 pound since visit in October 2014.  She reports irregular menses and she "almost wonders if she's pregnant". Last menstrual period was reportedly last month.  She is seeing Dr. Posey Pronto with neurology and also Dr. Meda Coffee with cardiology   Review of Systems As per history of present illness, otherwise negative  Current Medications, Allergies, Past Medical History, Past Surgical History, Family History and Social History were reviewed in [facility] record.     Objective:   Physical Exam BP 160/66  Pulse 112  Ht 5\' 6"  (1.676 m)  Wt 214 lb 12.8 oz (97.433 kg)  BMI 34.69 kg/m2  LMP 08/18/2013 Constitutional: Well-developed and well-nourished. No distress. HEENT: Normocephalic and atraumatic. Oropharynx is clear and moist. No oropharyngeal exudate. Conjunctivae are normal.  No scleral icterus the sclera muddy. Neck: Neck supple. Trachea midline. Cardiovascular: Normal rate, regular rhythm and intact distal pulses.  Pulmonary/chest: Effort normal and breath sounds normal. No wheezing, rales or rhonchi. Abdominal: Soft, obese, mild epigastric tenderness without rebound or guarding, nondistended. Bowel sounds active throughout.  Extremities: no clubbing, cyanosis, or edema Neurological: Alert and oriented to person place and time. Skin: Skin is warm and dry. No rashes noted. Psychiatric: Normal mood and affect. Behavior is normal.  CBC    Component Value Date/Time   WBC 10.4 07/27/2013 0007   WBC 5.6 04/08/2013 1522   RBC 4.58 07/27/2013 0007   RBC 4.77 04/08/2013 1522   HGB 14.3 07/27/2013 0007   HGB 14.6 04/08/2013 1522   HCT 40.3 07/27/2013 0007   HCT 44.5 04/08/2013 1522   PLT 244 07/27/2013 0007   PLT 214 04/08/2013 1522   MCV 88.0 07/27/2013 0007   MCV 93.2 04/08/2013 1522   MCH 31.2 07/27/2013 0007   MCH 30.7 04/08/2013 1522   MCHC 35.5 07/27/2013 0007   MCHC 32.9 04/08/2013 1522   RDW 13.6 07/27/2013 0007   RDW 13.8 04/08/2013 1522   LYMPHSABS 1.7 07/14/2013 1300   LYMPHSABS 1.6 04/08/2013 1522   MONOABS 0.5 07/14/2013 1300   MONOABS 0.4 04/08/2013 1522   EOSABS 0.1 07/14/2013 1300   EOSABS  0.1 04/08/2013 1522   BASOSABS 0.0 07/14/2013 1300   BASOSABS 0.1 04/08/2013 1522    CMP     Component Value Date/Time   NA 139 07/27/2013 0007   NA 139 04/08/2013 1522   K 3.9 07/27/2013 0007   K 3.5 04/08/2013 1522   CL 101 07/27/2013 0007   CL 103 09/10/2012 1618   CO2 23 07/27/2013 0007   CO2 24 04/08/2013 1522   GLUCOSE 98 07/27/2013  0007   GLUCOSE 109 04/08/2013 1522   GLUCOSE 86 09/10/2012 1618   BUN 15 07/27/2013 0007   BUN 6.2* 04/08/2013 1522   CREATININE 1.03 07/27/2013 0007   CREATININE 0.8 04/08/2013 1522   CREATININE 0.64 05/02/2011 1021   CALCIUM 9.7 07/27/2013 0007   CALCIUM 9.1 04/08/2013 1522   PROT 8.0 04/08/2013 1522   PROT 7.9 02/16/2013 1243   ALBUMIN 3.9 04/08/2013 1522   ALBUMIN 3.9 02/16/2013 1243   AST 29 04/08/2013 1522   AST 35 02/16/2013 1243   ALT 40 04/08/2013 1522   ALT 40* 02/16/2013 1243   ALKPHOS 55 04/08/2013 1522   ALKPHOS 46 02/16/2013 1243   BILITOT 0.47 04/08/2013 1522   BILITOT 0.6 02/16/2013 1243   GFRNONAA 68* 07/27/2013 0007   GFRAA 78* 07/27/2013 0007        Assessment & Plan:  38 yo female with PMH of GERD, gastroparesis, peptic ulcer disease, breast cancer status post lumpectomy with chemotherapy/XRT felt to be in remission, malignant hypertension, pseudotumor cerebri, 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   1.  Gastroparesis/N/epigastric pain/GERD -- her symptoms have been responsive to promotility agents and she is using Reglan 10 mg before meals and at bedtime. This on average is 3 doses daily. I have strongly recommended that she follow the gastroparesis diet. Domperidone as an option which may have less neurologic side effect, but her QTc interval would need to be monitored. I will ask her cardiologist, Dr. Meda Coffee, for her opinion regarding domperidone use. If she is okay we will change to domperidone 10 mg before meals and at bedtime. If the switch is made she would need annual EKG. I will resume pantoprazole 40 mg twice daily. Levsin increased to 0.25 every 4-6 hours as needed for cramping abdominal pain. I'm also checking a beta hCG blood test today and ordering an abdominal ultrasound to rule out gallstones.  2.  Hypertension -- difficult to control and systolic pressure high today. She is seeing primary care and  cardiology for this issue. She is on 4 blood pressure pills 2 of which are combination. Some of her nausea may be medication related.  Followup in 3 months

## 2013-09-02 NOTE — Patient Instructions (Signed)
You have been scheduled for an abdominal ultrasound at [facility] (1st floor of hospital) on 09/06/2013 at 10:00. Please arrive 15 minutes prior to your appointment for registration. Make certain not to have anything to eat or drink 6 hours prior to your appointment. Should you need to reschedule your appointment, please contact radiology at [phone]. This test typically takes about 30 minutes to perform.  We have sent the following medications to your pharmacy for you to pick up at your convenience: protonix twice a day. Continue reglan  Your physician has requested that you go to the basement for  lab work before leaving today

## 2013-09-02 NOTE — Progress Notes (Signed)
Patient ID: Erica [name], female   DOB: 29-Sep-1975, 38 y.o.   MRN: [id number] ARIA 48 hour holter monitor applied to patient.

## 2013-09-06 ENCOUNTER — Ambulatory Visit (HOSPITAL_COMMUNITY): Payer: Medicaid - Out of State

## 2013-09-08 ENCOUNTER — Telehealth: Payer: Self-pay | Admitting: *Deleted

## 2013-09-08 NOTE — Telephone Encounter (Signed)
Pt contacted about holter monitor results showing no arrhythmias and is normal per Dr Meda Coffee.  Informed pt of f/u ov on 6/29.  Pt verbalized understanding and pleased with the follow-up.

## 2013-09-13 ENCOUNTER — Ambulatory Visit (HOSPITAL_COMMUNITY)
Admission: RE | Admit: 2013-09-13 | Discharge: 2013-09-13 | Disposition: A | Discharge status: Home / Self Care | Payer: Medicaid - Out of State | Source: Ambulatory Visit | Attending: Internal Medicine | Admitting: Internal Medicine

## 2013-09-13 DIAGNOSIS — K219 Gastro-esophageal reflux disease without esophagitis: Secondary | ICD-10-CM

## 2013-09-13 DIAGNOSIS — R1013 Epigastric pain: Secondary | ICD-10-CM | POA: Insufficient documentation

## 2013-09-13 DIAGNOSIS — R110 Nausea: Secondary | ICD-10-CM

## 2013-09-13 DIAGNOSIS — K7689 Other specified diseases of liver: Secondary | ICD-10-CM | POA: Insufficient documentation

## 2013-09-15 ENCOUNTER — Emergency Department (HOSPITAL_COMMUNITY): Payer: Medicaid - Out of State

## 2013-09-15 ENCOUNTER — Encounter (HOSPITAL_COMMUNITY): Payer: Self-pay | Admitting: Emergency Medicine

## 2013-09-15 ENCOUNTER — Emergency Department (HOSPITAL_COMMUNITY)
Admission: EM | Admit: 2013-09-15 | Discharge: 2013-09-15 | Disposition: A | Discharge status: Home / Self Care | Payer: Medicaid - Out of State | Attending: Emergency Medicine | Admitting: Emergency Medicine

## 2013-09-15 DIAGNOSIS — Z8669 Personal history of other diseases of the nervous system and sense organs: Secondary | ICD-10-CM | POA: Insufficient documentation

## 2013-09-15 DIAGNOSIS — Z880 Allergy status to penicillin: Secondary | ICD-10-CM | POA: Insufficient documentation

## 2013-09-15 DIAGNOSIS — K219 Gastro-esophageal reflux disease without esophagitis: Secondary | ICD-10-CM | POA: Insufficient documentation

## 2013-09-15 DIAGNOSIS — R1011 Right upper quadrant pain: Secondary | ICD-10-CM | POA: Insufficient documentation

## 2013-09-15 DIAGNOSIS — Z853 Personal history of malignant neoplasm of breast: Secondary | ICD-10-CM | POA: Insufficient documentation

## 2013-09-15 DIAGNOSIS — R109 Unspecified abdominal pain: Secondary | ICD-10-CM

## 2013-09-15 DIAGNOSIS — Z862 Personal history of diseases of the blood and blood-forming organs and certain disorders involving the immune mechanism: Secondary | ICD-10-CM | POA: Insufficient documentation

## 2013-09-15 DIAGNOSIS — Z3202 Encounter for pregnancy test, result negative: Secondary | ICD-10-CM | POA: Insufficient documentation

## 2013-09-15 DIAGNOSIS — J45909 Unspecified asthma, uncomplicated: Secondary | ICD-10-CM | POA: Insufficient documentation

## 2013-09-15 DIAGNOSIS — I10 Essential (primary) hypertension: Secondary | ICD-10-CM | POA: Insufficient documentation

## 2013-09-15 DIAGNOSIS — Z79899 Other long term (current) drug therapy: Secondary | ICD-10-CM | POA: Insufficient documentation

## 2013-09-15 LAB — CBC WITH DIFFERENTIAL/PLATELET
Basophils Absolute: 0 10*3/uL (ref 0.0–0.1)
Basophils Relative: 0 % (ref 0–1)
Eosinophils Absolute: 0.2 10*3/uL (ref 0.0–0.7)
Eosinophils Relative: 2 % (ref 0–5)
HEMATOCRIT: 36.9 % (ref 36.0–46.0)
HEMOGLOBIN: 12.4 g/dL (ref 12.0–15.0)
LYMPHS PCT: 25 % (ref 12–46)
Lymphs Abs: 1.9 10*3/uL (ref 0.7–4.0)
MCH: 30.6 pg (ref 26.0–34.0)
MCHC: 33.6 g/dL (ref 30.0–36.0)
MCV: 91.1 fL (ref 78.0–100.0)
MONO ABS: 0.7 10*3/uL (ref 0.1–1.0)
Monocytes Relative: 9 % (ref 3–12)
Neutro Abs: 4.8 10*3/uL (ref 1.7–7.7)
Neutrophils Relative %: 64 % (ref 43–77)
PLATELETS: 215 10*3/uL (ref 150–400)
RBC: 4.05 MIL/uL (ref 3.87–5.11)
RDW: 14.3 % (ref 11.5–15.5)
WBC: 7.6 10*3/uL (ref 4.0–10.5)

## 2013-09-15 LAB — URINALYSIS, ROUTINE W REFLEX MICROSCOPIC
BILIRUBIN URINE: NEGATIVE
Glucose, UA: NEGATIVE mg/dL
Hgb urine dipstick: NEGATIVE
Ketones, ur: NEGATIVE mg/dL
Leukocytes, UA: NEGATIVE
Nitrite: NEGATIVE
PROTEIN: NEGATIVE mg/dL
Specific Gravity, Urine: 1.025 (ref 1.005–1.030)
UROBILINOGEN UA: 1 mg/dL (ref 0.0–1.0)
pH: 5.5 (ref 5.0–8.0)

## 2013-09-15 LAB — COMPREHENSIVE METABOLIC PANEL
ALT: 32 U/L (ref 0–35)
AST: 24 U/L (ref 0–37)
Albumin: 3.4 g/dL — ABNORMAL LOW (ref 3.5–5.2)
Alkaline Phosphatase: 43 U/L (ref 39–117)
BUN: 12 mg/dL (ref 6–23)
CALCIUM: 9.1 mg/dL (ref 8.4–10.5)
CO2: 19 meq/L (ref 19–32)
Chloride: 102 mEq/L (ref 96–112)
Creatinine, Ser: 0.91 mg/dL (ref 0.50–1.10)
GFR, EST NON AFRICAN AMERICAN: 78 mL/min — AB (ref >90)
GLUCOSE: 119 mg/dL — AB (ref 70–99)
Potassium: 3.8 mEq/L (ref 3.7–5.3)
SODIUM: 137 meq/L (ref 137–147)
Total Bilirubin: <0.2 mg/dL — ABNORMAL LOW (ref 0.3–1.2)
Total Protein: 7.2 g/dL (ref 6.0–8.3)

## 2013-09-15 LAB — URINE MICROSCOPIC-ADD ON

## 2013-09-15 LAB — POC URINE PREG, ED: PREG TEST UR: NEGATIVE

## 2013-09-15 LAB — LIPASE, BLOOD: Lipase: 38 U/L (ref 11–59)

## 2013-09-15 LAB — D-DIMER, QUANTITATIVE: D-Dimer, Quant: 0.42 ug/mL-FEU (ref 0.00–0.48)

## 2013-09-15 MED ORDER — IOHEXOL 300 MG/ML  SOLN
50.0000 mL | Freq: Once | INTRAMUSCULAR | Status: AC | PRN
  Administered 2013-09-15: 50 mL via ORAL

## 2013-09-15 MED ORDER — ONDANSETRON HCL 4 MG/2ML IJ SOLN
4.0000 mg | Freq: Once | INTRAMUSCULAR | Status: AC
  Administered 2013-09-15: 4 mg via INTRAVENOUS
  Filled 2013-09-15: qty 2

## 2013-09-15 MED ORDER — SUCRALFATE 1 G PO TABS: 1.0000 g | ORAL_TABLET | Freq: Three times a day (TID) | ORAL | Status: DC

## 2013-09-15 MED ORDER — ONDANSETRON 4 MG PO TBDP: ORAL_TABLET | ORAL | Status: DC

## 2013-09-15 MED ORDER — HYDROCODONE-ACETAMINOPHEN 5-325 MG PO TABS: 1.0000 | ORAL_TABLET | Freq: Four times a day (QID) | ORAL | Status: DC | PRN

## 2013-09-15 MED ORDER — MORPHINE SULFATE 4 MG/ML IJ SOLN
4.0000 mg | Freq: Once | INTRAMUSCULAR | Status: AC
Start: 2013-09-15 — End: 2013-09-15
  Administered 2013-09-15: 4 mg via INTRAVENOUS
  Filled 2013-09-15: qty 1

## 2013-09-15 MED ORDER — GI COCKTAIL ~~LOC~~
30.0000 mL | Freq: Once | ORAL | Status: AC
  Administered 2013-09-15: 30 mL via ORAL
  Filled 2013-09-15: qty 30

## 2013-09-15 MED ORDER — OXYCODONE-ACETAMINOPHEN 5-325 MG PO TABS
2.0000 | ORAL_TABLET | Freq: Once | ORAL | Status: AC
  Administered 2013-09-15: 2 via ORAL
  Filled 2013-09-15: qty 2

## 2013-09-15 MED ORDER — IOHEXOL 300 MG/ML  SOLN
100.0000 mL | Freq: Once | INTRAMUSCULAR | Status: AC | PRN
Start: 2013-09-15 — End: 2013-09-15
  Administered 2013-09-15: 100 mL via INTRAVENOUS

## 2013-09-15 NOTE — Discharge Instructions (Signed)
Abdominal Pain, Women °Abdominal (stomach, pelvic, or belly) pain can be caused by many things. It is important to tell your doctor: °· The location of the pain. °· Does it come and go or is it present all the time? °· Are there things that start the pain (eating certain foods, exercise)? °· Are there other symptoms associated with the pain (fever, nausea, vomiting, diarrhea)? °All of this is helpful to know when trying to find the cause of the pain. °CAUSES  °· Stomach: virus or bacteria infection, or ulcer. °· Intestine: appendicitis (inflamed appendix), regional ileitis (Crohn's disease), ulcerative colitis (inflamed colon), irritable bowel syndrome, diverticulitis (inflamed diverticulum of the colon), or cancer of the stomach or intestine. °· Gallbladder disease or stones in the gallbladder. °· Kidney disease, kidney stones, or infection. °· Pancreas infection or cancer. °· Fibromyalgia (pain disorder). °· Diseases of the female organs: °¨ Uterus: fibroid (non-cancerous) tumors or infection. °¨ Fallopian tubes: infection or tubal pregnancy. °¨ Ovary: cysts or tumors. °¨ Pelvic adhesions (scar tissue). °¨ Endometriosis (uterus lining tissue growing in the pelvis and on the pelvic organs). °¨ Pelvic congestion syndrome (female organs filling up with blood just before the menstrual period). °¨ Pain with the menstrual period. °¨ Pain with ovulation (producing an egg). °¨ Pain with an IUD (intrauterine device, birth control) in the uterus. °¨ Cancer of the female organs. °· Functional pain (pain not caused by a disease, may improve without treatment). °· Psychological pain. °· Depression. °DIAGNOSIS  °Your doctor will decide the seriousness of your pain by doing an examination. °· Blood tests. °· X-rays. °· Ultrasound. °· CT scan (computed tomography, special type of X-ray). °· MRI (magnetic resonance imaging). °· Cultures, for infection. °· Barium enema (dye inserted in the large intestine, to better view it with  X-rays). °· Colonoscopy (looking in intestine with a lighted tube). °· Laparoscopy (minor surgery, looking in abdomen with a lighted tube). °· Major abdominal exploratory surgery (looking in abdomen with a large incision). °TREATMENT  °The treatment will depend on the cause of the pain.  °· Many cases can be observed and treated at home. °· Over-the-counter medicines recommended by your caregiver. °· Prescription medicine. °· Antibiotics, for infection. °· Birth control pills, for painful periods or for ovulation pain. °· Hormone treatment, for endometriosis. °· Nerve blocking injections. °· Physical therapy. °· Antidepressants. °· Counseling with a psychologist or psychiatrist. °· Minor or major surgery. °HOME CARE INSTRUCTIONS  °· Do not take laxatives, unless directed by your caregiver. °· Take over-the-counter pain medicine only if ordered by your caregiver. Do not take aspirin because it can cause an upset stomach or bleeding. °· Try a clear liquid diet (broth or water) as ordered by your caregiver. Slowly move to a bland diet, as tolerated, if the pain is related to the stomach or intestine. °· Have a thermometer and take your temperature several times a day, and record it. °· Bed rest and sleep, if it helps the pain. °· Avoid sexual intercourse, if it causes pain. °· Avoid stressful situations. °· Keep your follow-up appointments and tests, as your caregiver orders. °· If the pain does not go away with medicine or surgery, you may try: °¨ Acupuncture. °¨ Relaxation exercises (yoga, meditation). °¨ Group therapy. °¨ Counseling. °SEEK MEDICAL CARE IF:  °· You notice certain foods cause stomach pain. °· Your home care treatment is not helping your pain. °· You need stronger pain medicine. °· You want your IUD removed. °· You feel faint or   lightheaded. °· You develop nausea and vomiting. °· You develop a rash. °· You are having side effects or an allergy to your medicine. °SEEK IMMEDIATE MEDICAL CARE IF:  °· Your  pain does not go away or gets worse. °· You have a fever. °· Your pain is felt only in portions of the abdomen. The right side could possibly be appendicitis. The left lower portion of the abdomen could be colitis or diverticulitis. °· You are passing blood in your stools (bright red or black tarry stools, with or without vomiting). °· You have blood in your urine. °· You develop chills, with or without a fever. °· You pass out. °MAKE SURE YOU:  °· Understand these instructions. °· Will watch your condition. °· Will get help right away if you are not doing well or get worse. °Document Released: 01/13/2007 Document Revised: 06/10/2011 Document Reviewed: 02/02/2009 °ExitCare® Patient Information ©2015 [employer]. This information is not intended to replace advice given to you by your health care provider. Make sure you discuss any questions you have with your health care provider. ° °

## 2013-09-15 NOTE — ED Notes (Signed)
Pt aware of need for urine sample, states unable to void at this time. Will attempt again.

## 2013-09-15 NOTE — ED Notes (Signed)
  Patient states she doesn't have to urinate

## 2013-09-15 NOTE — ED Provider Notes (Signed)
CSN: [id number]     Arrival date & time 09/15/13  0630 History   First MD Initiated Contact with Patient 09/15/13 [phone]     Chief Complaint  Patient presents with  . Abdominal Pain     (Consider location/radiation/quality/duration/timing/severity/associated sxs/prior Treatment) Patient is a 38 y.o. female presenting with abdominal pain. The history is provided by the patient.  Abdominal Pain Pain location:  RUQ Pain quality: sharp   Pain radiates to:  Does not radiate Pain severity:  Moderate Onset quality:  Sudden Timing:  Constant Progression:  Unchanged Chronicity:  New Context: not eating, not previous surgeries and not recent illness   Relieved by:  Nothing Worsened by:  Deep breathing Associated symptoms: no cough, no fever, no nausea, no shortness of breath and no vomiting     Past Medical History  Diagnosis Date  . Breast cancer 2010  . Asthma   . Hypertension   . GERD (gastroesophageal reflux disease)   . Impaired glucose tolerance 04/13/2011  . Anemia, unspecified 04/13/2011  . Asthma 09/27/2010  . Bloating 03/18/2011  . 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 Lymphadenitis, chronic     restricted LEFT extremity   Past Surgical History  Procedure Laterality Date  . Hand surgery  2012    right; tendon release  . Breast lumpectomy  09/2008    left  . Carpal tunnel release Left 2011  . Tubal ligation  05/11/2011    Procedure: POST PARTUM TUBAL LIGATION;  Surgeon: Emeterio Reeve, MD;  Location: [facility];  Service: Gynecology;  Laterality: Bilateral;  Induced for HTN  . Lymph node dissection  2010    left breast; 2 wks after breast lumpectomy   Family History  Problem Relation Age of Onset  . Diabetes Maternal Grandmother   . Heart disease Maternal Grandmother   . Hypertension Maternal Grandmother   . Cancer Neg Hx   . Alcohol abuse Neg Hx   . Early death Neg Hx   . Hyperlipidemia Neg Hx   . Kidney disease Neg Hx   . Stroke Neg Hx   . Colon cancer Neg Hx     History  Substance Use Topics  . Smoking status: Never Smoker   . Smokeless tobacco: Never Used  . Alcohol Use: 0.0 oz/week     Comment: occasionally   OB History   Grav Para Term Preterm Abortions TAB SAB Ect Mult Living   5 4 4  1  1   4      Review of Systems  Constitutional: Negative for fever.  Respiratory: Negative for cough and shortness of breath.   Gastrointestinal: Positive for abdominal pain. Negative for nausea and vomiting.  All other systems reviewed and are negative.     Allergies  Aspirin and Penicillins  Home Medications   Prior to Admission medications   Medication Sig Start Date End Date Taking? Authorizing Provider  acetaZOLAMIDE (DIAMOX) 250 MG tablet Take 2 tablets (500 mg total) by mouth 2 (two) times daily. 02/05/13  Yes Saddie Benders. Ghim, MD  albuterol (PROVENTIL HFA;VENTOLIN HFA) 108 (90 BASE) MCG/ACT inhaler Inhale 2 puffs into the lungs every 4 (four) hours as needed for wheezing or shortness of breath (((PLAN A))).    Yes Historical Provider, MD  albuterol (PROVENTIL) (2.5 MG/3ML) 0.083% nebulizer solution Take 3 mLs (2.5 mg total) by nebulization every 4 (four) hours as needed for wheezing or shortness of breath (((PLAN B))). 04/06/13  Yes Tammy S Parrett, NP  amLODipine-olmesartan (AZOR) 10-40 MG  per tablet Take 1 tablet by mouth daily. 06/23/13  Yes [name], MD  Biotin 1000 MCG tablet Take 1,000 mcg by mouth daily.   Yes Historical Provider, MD  cloNIDine (CATAPRES) 0.3 MG tablet Take 1 tablet (0.3 mg total) by mouth 2 (two) times daily. 08/24/13  Yes [name], MD  dextromethorphan-guaiFENesin [facility] DM) 30-600 MG per 12 hr tablet Take 1 tablet by mouth every 12 (twelve) hours as needed for cough.   Yes Historical Provider, MD  hyoscyamine (LEVSIN SL) 0.125 MG SL tablet Place 1 tablet (0.125 mg total) under the tongue every 8 (eight) hours as needed for cramping. 09/02/13  Yes Jerene Bears, MD  LORazepam (ATIVAN) 0.5 MG tablet Take  0.5 mg by mouth every 8 (eight) hours as needed for anxiety. 01/07/13  Yes Chauncey Cruel, MD  metoCLOPramide (REGLAN) 10 MG tablet Take 1 tablet (10 mg total) by mouth 4 (four) times daily -  with meals and at bedtime. 01/26/13  Yes Jerene Bears, MD  mometasone (NASONEX) 50 MCG/ACT nasal spray Place 2 sprays into the nose daily. 07/27/13  Yes Julianne Rice, MD  mometasone-formoterol [facility]) 200-5 MCG/ACT AERO Inhale 2 puffs into the lungs 2 (two) times daily. 04/06/13  Yes Tammy S Parrett, NP  montelukast (SINGULAIR) 10 MG tablet Take 1 tablet (10 mg total) by mouth at bedtime. 07/14/13  Yes Tanda Rockers, MD  nebivolol (BYSTOLIC) 5 MG tablet Take 1 tablet (5 mg total) by mouth daily. 08/24/13  Yes [name], MD  nitroGLYCERIN (NITROSTAT) 0.4 MG SL tablet Place 1 tablet (0.4 mg total) under the tongue every 5 (five) minutes as needed for chest pain. 08/24/13  Yes [name], MD  pantoprazole (PROTONIX) 40 MG tablet Take 1 tablet (40 mg total) by mouth 2 (two) times daily before a meal. 09/02/13  Yes Jerene Bears, MD  potassium chloride SA (K-DUR,KLOR-CON) 20 MEQ tablet Take 20 mEq by mouth 2 (two) times daily. 06/04/12  Yes [name], MD  pregabalin (LYRICA) 100 MG capsule Take 1 capsule (100 mg total) by mouth 2 (two) times daily. 08/24/13  Yes Alda Berthold, MD  sucralfate (CARAFATE) 1 G tablet Take 1 tablet (1 g total) by mouth 4 (four) times daily. 03/07/12  Yes [name], MD  traMADol (ULTRAM) 50 MG tablet 1-2 every 4 hours as needed for cough or pain   Yes Historical Provider, MD  triamterene-hydrochlorothiazide (MAXZIDE-25) 37.5-25 MG per tablet Take 1 tablet by mouth every morning.   Yes Historical Provider, MD   BP 133/78  Pulse 98  Temp(Src) 98.4 F (36.9 C) (Oral)  Resp 19  SpO2 99%  LMP 05/21/2013 Physical Exam  Nursing note and vitals reviewed. Constitutional: She is oriented to person, place, and time. She appears well-developed and well-nourished. No  distress.  HENT:  Head: Normocephalic and atraumatic.  Mouth/Throat: Oropharynx is clear and moist.  Eyes: EOM are normal. Pupils are equal, round, and reactive to light.  Neck: Normal range of motion. Neck supple.  Cardiovascular: Normal rate and regular rhythm.  Exam reveals no friction rub.   No murmur heard. Pulmonary/Chest: Effort normal and breath sounds normal. No respiratory distress. She has no wheezes. She has no rales.  Abdominal: Soft. She exhibits no distension. There is tenderness (RUQ). There is no rebound.  Musculoskeletal: Normal range of motion. She exhibits no edema.  Neurological: She is alert and oriented to person, place, and time.  Skin: She  is not diaphoretic.    ED Course  Procedures (including critical care time) Labs Review Labs Reviewed  COMPREHENSIVE METABOLIC PANEL - Abnormal; Notable for the following:    Glucose, Bld 119 (*)    Albumin 3.4 (*)    Total Bilirubin <0.2 (*)    GFR calc non Af Amer 78 (*)    All other components within normal limits  CBC WITH DIFFERENTIAL  LIPASE, BLOOD  URINALYSIS, ROUTINE W REFLEX MICROSCOPIC  POC URINE PREG, ED    Imaging Review US Abdomen Complete  09/13/2013   CLINICAL DATA:  Epigastric pain.  GERD  EXAM: ULTRASOUND ABDOMEN COMPLETE  COMPARISON:  CT abdomen 05/22/2012  FINDINGS: Gallbladder:  No gallstones or wall thickening visualized. No sonographic Murphy sign noted.  Common bile duct:  Diameter: 4.5 mm  Liver:  Liver is hyperechoic compatible with fatty infiltration. Hypoechoic Liver parenchyma near the porta hepatis most likely fatty sparing. This measures approximately 3.5 cm.  IVC:  No abnormality visualized.  Pancreas:  Visualized portion unremarkable.  Spleen:  Size and appearance within normal limits.  Right Kidney:  Length: 12.5 cm. Complex cyst measures 2.5 x 2.4 cm unchanged from the prior CT and also present in 2010.  Left Kidney:  Length: 11.2 cm. Echogenicity within normal limits. No mass or  hydronephrosis visualized.  Abdominal aorta:  No aneurysm visualized.  Other findings:  None.  IMPRESSION: Fatty infiltration of liver with probable fatty sparing.  Negative for gallstones  Complex cyst right kidney stable from prior studies.   Electronically Signed   By: Franchot Gallo M.D.   On: 09/13/2013 11:33     EKG Interpretation None      MDM   Final diagnoses:  Abdominal pain    38 year old female presents with right upper quadrant pain. This began this morning when waking up. She saw GI 2 weeks ago for this, which is having intermittently. No nausea or vomiting. Right upper quadrant ultrasound was ordered then, but completed 2 days ago. This was normal. No gallstones. No fevers. She does have history of breast cancer, but this is felt to be in remission. Here vitals showed no fever, mild tachycardia, normal O2 sat. She has right upper quadrant pain on exam. She states to me, "it hurts when I breathe." She does have right upper quadrant pain on exam. No other abdominal pain. With negative ultrasound 2 days ago, normal labs, doubt cholecystitis. With the pleuritic nature of the pain, could be due to biliary disease, but will send d-dimer. Dimer negative, labs negative. Pain persistent despite pain meds, will CT scan. CT normal. Unclear etiology of pain. Review of records shows seen by GI for this - is on PPI now. Will put on carafate, given zofran and oxycodone. Stable for discharge.   Osvaldo Shipper, MD 09/15/13 [phone]

## 2013-09-15 NOTE — ED Notes (Signed)
Pt c/o RUQ pain since she woke up this morning.  Denies vomiting or diarrhea but states it hurts to move and breathe.

## 2013-09-15 NOTE — ED Notes (Signed)
Patient has tried to urinate and was unable to.  She will try again soon.

## 2013-09-15 NOTE — ED Notes (Signed)
PT UNABLE TO VOID AT THIS TIME.

## 2013-09-15 NOTE — ED Notes (Signed)
MD at bedside. 

## 2013-09-27 ENCOUNTER — Ambulatory Visit (INDEPENDENT_AMBULATORY_CARE_PROVIDER_SITE_OTHER): Payer: Medicaid - Out of State | Admitting: Cardiology

## 2013-09-27 ENCOUNTER — Encounter: Payer: Self-pay | Admitting: Cardiology

## 2013-09-27 VITALS — BP 136/88 | HR 74 | Ht 65.0 in | Wt 215.0 lb

## 2013-09-27 DIAGNOSIS — R7309 Other abnormal glucose: Secondary | ICD-10-CM

## 2013-09-27 DIAGNOSIS — I5032 Chronic diastolic (congestive) heart failure: Secondary | ICD-10-CM

## 2013-09-27 DIAGNOSIS — E876 Hypokalemia: Secondary | ICD-10-CM

## 2013-09-27 DIAGNOSIS — I509 Heart failure, unspecified: Secondary | ICD-10-CM

## 2013-09-27 DIAGNOSIS — R0989 Other specified symptoms and signs involving the circulatory and respiratory systems: Secondary | ICD-10-CM

## 2013-09-27 DIAGNOSIS — R0600 Dyspnea, unspecified: Secondary | ICD-10-CM

## 2013-09-27 DIAGNOSIS — R0609 Other forms of dyspnea: Secondary | ICD-10-CM

## 2013-09-27 MED ORDER — DILTIAZEM HCL ER COATED BEADS 120 MG PO CP24: 120.0000 mg | ORAL_CAPSULE | Freq: Every day | ORAL | Status: DC

## 2013-09-27 MED ORDER — ISOSORBIDE MONONITRATE ER 30 MG PO TB24: 30.0000 mg | ORAL_TABLET | Freq: Every day | ORAL | Status: DC

## 2013-09-27 NOTE — Patient Instructions (Addendum)
Your physician has recommended you make the following change in your medication:   STOP TAKING YOUR BYSTOLIC NOW   START TAKING IMDUR 30 MG DAILY  Your physician recommends that you schedule a follow-up appointment in:2 MONTHS WITH DR Meda Coffee (FIRST AVAILABLE )  PLEASE CALL Erica [name] TO TELL Erica [name] HOW YOU ARE FEELING.  DR Meda Coffee RECOMMENDS YOU GETTING A BP CUFF

## 2013-09-27 NOTE — Progress Notes (Signed)
Patient ID: Erica [name], female   DOB: 12-Apr-1975, 38 y.o.   MRN: [id number]    Patient Name: Erica [name] Date of Encounter: 09/27/2013  Primary Care Provider:  Scarlette Calico, MD Primary Cardiologist:  Ena Dawley H  Patient Profile  Chest pain  Problem List   Past Medical History  Diagnosis Date  . Breast cancer 2010  . Asthma   . Hypertension   . GERD (gastroesophageal reflux disease)   . Impaired glucose tolerance 04/13/2011  . Anemia, unspecified 04/13/2011  . Asthma 09/27/2010  . Bloating 03/18/2011  . 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 Lymphadenitis, chronic     restricted LEFT extremity   Past Surgical History  Procedure Laterality Date  . Hand surgery  2012    right; tendon release  . Breast lumpectomy  09/2008    left  . Carpal tunnel release Left 2011  . Tubal ligation  05/11/2011    Procedure: POST PARTUM TUBAL LIGATION;  Surgeon: Emeterio Reeve, MD;  Location: [facility];  Service: Gynecology;  Laterality: Bilateral;  Induced for HTN  . Lymph node dissection  2010    left breast; 2 wks after breast lumpectomy    Allergies  Allergies  Allergen Reactions  . Aspirin Shortness Of Breath and Palpitations    Pt can take ibuprofen without reaction  . Penicillins Shortness Of Breath and Palpitations    HPI  38 year old female with h/o asthma, obesity, hypertension, who is coming with complaints of chest pain. [name] has a history of locally advanced left breast carcinoma diagnosed in 2010 and treated with neoadjuvant chemotherapy with docetaxel and cyclophosphamide as well as paclitaxel. This was followed by radiation therapy which was completed in 2011. The patient complains of chest pain it is not exertional, it is locally located in the retrosternal and left epigastric region. It has been happening anytime of the day, and can last for minutes. There is no clear aggravating or alleviating factor. The patient states that ever since her  chemotherapy she gets short of breath with minimal activity and states that she will be able to walk a block.  Follow up after 4 month, no change in her symptoms, she still has exertional shortness of breath and fatigue. She also has a paroxysmal chest pain "heart squeezing" associated with SOB, alleviated by lorazepam and inhalors.   09/27/2013 - the patient is coming complaining of continuous daily wheezing, and nonexertional right upper quadrant pain that force her to go to the ER. A complete workup including abdominal ultrasound and CAT scan was performed. The only finding was fatty liver no acute pathology in the gallbladder no gallstones. The patient had an upper EGD last year where her gastroparesis was diagnosed and she was started on metoclopramide and proton next. Her symptoms improved however she continues having episodes of this pain. She feels overall very tired and short of breath and complaints of orthopnea.   Home Medications  Prior to Admission medications   Medication Sig Start Date End Date Taking? Authorizing Provider  albuterol (PROVENTIL HFA;VENTOLIN HFA) 108 (90 BASE) MCG/ACT inhaler Inhale 2 puffs into the lungs every 4 (four) hours as needed for wheezing or shortness of breath (((PLAN A))).     Historical Provider, MD  albuterol (PROVENTIL) (2.5 MG/3ML) 0.083% nebulizer solution Take 2.5 mg by nebulization every 4 (four) hours as needed for wheezing or shortness of breath (((PLAN B))). For shortness of breath. 05/07/12   Theotis Burrow, PA-C  amLODipine-olmesartan (AZOR) 5-40 MG per tablet Take 1 tablet by mouth daily. 08/05/12   [name], MD  cloNIDine (CATAPRES) 0.2 MG tablet Take 1 tablet (0.2 mg total) by mouth 2 (two) times daily. 08/05/12   [name], MD  hyoscyamine (LEVSIN SL) 0.125 MG SL tablet Place 0.125 mg under the tongue every 8 (eight) hours as needed for cramping. 05/19/12   Jerene Bears, MD  LORazepam (ATIVAN) 0.5 MG tablet Take 1 tablet (0.5 mg total) by  mouth at bedtime as needed for anxiety. 01/07/13   Chauncey Cruel, MD  metoCLOPramide (REGLAN) 10 MG tablet Take 1 tablet (10 mg total) by mouth 4 (four) times daily -  with meals and at bedtime. 01/14/13   Jerene Bears, MD  mometasone-formoterol (DULERA) 100-5 MCG/ACT AERO Inhale 2 puffs into the lungs 2 (two) times daily. 05/07/12   Amy Milda Smart, PA-C  montelukast (SINGULAIR) 10 MG tablet Take 1 tablet (10 mg total) by mouth at bedtime. 12/31/11   Chauncey Cruel, MD  nebivolol (BYSTOLIC) 10 MG tablet Take 10 mg by mouth at bedtime.    Historical Provider, MD  pantoprazole (PROTONIX) 40 MG tablet Take 1 tablet (40 mg total) by mouth daily. 01/07/13   Chauncey Cruel, MD  potassium chloride SA (K-DUR,KLOR-CON) 20 MEQ tablet Take 20 mEq by mouth 2 (two) times daily. 06/04/12   [name], MD  sucralfate (CARAFATE) 1 G tablet Take 1 tablet (1 g total) by mouth 4 (four) times daily. 03/07/12   [name], MD  traMADol Veatrice Bourbon) 50 MG tablet 1-2 tabs by mouth every 4 hours as needed for cough or pain 09/10/12   Amy Milda Smart, PA-C  triamterene-hydrochlorothiazide (MAXZIDE-25) 37.5-25 MG per tablet Take 1 tablet by mouth every morning.    Historical Provider, MD    Family History  Family History  Problem Relation Age of Onset  . Diabetes Maternal Grandmother   . Heart disease Maternal Grandmother   . Hypertension Maternal Grandmother   . Cancer Neg Hx   . Alcohol abuse Neg Hx   . Early death Neg Hx   . Hyperlipidemia Neg Hx   . Kidney disease Neg Hx   . Stroke Neg Hx   . Colon cancer Neg Hx     Social History  History   Social History  . Marital Status: Single    Spouse Name: N/A    Number of Children: 3  . Years of Education: N/A   Occupational History  .     Social History Main Topics  . Smoking status: Never Smoker   . Smokeless tobacco: Never Used  . Alcohol Use: 0.0 oz/week     Comment: occasionally  . Drug Use: No  . Sexual Activity: Yes    Birth Control/  Protection: Surgical   Other Topics Concern  . Not on file   Social History Narrative   She lives with four children.   She is currently not working (disbaility pending).           Review of Systems General:  No chills, fever, night sweats or weight changes.  Cardiovascular:  No chest pain, dyspnea on exertion, edema, orthopnea, palpitations, paroxysmal nocturnal dyspnea. Dermatological: No rash, lesions/masses Respiratory: No cough, dyspnea Urologic: No hematuria, dysuria Abdominal:   No nausea, vomiting, diarrhea, bright red blood per rectum, melena, or hematemesis Neurologic:  No visual changes, wkns, changes in mental status. All other systems reviewed and are otherwise negative  except as noted above.  Physical Exam Blood pressure 136/88, pulse 74, height 5\' 5"  (1.651 m), weight 215 lb (97.523 kg), last menstrual period 05/21/2013, SpO2 99.00%.  General: Pleasant, NAD Psych: Normal affect. Neuro: Alert and oriented X 3. Moves all extremities spontaneously. HEENT: Normal  Neck: Supple without bruits or JVD. Lungs:  Resp regular and unlabored, CTA. Heart: RRR no s3, s4, or murmurs. Abdomen: Soft, non-tender, non-distended, BS + x 4.  Extremities: No clubbing, cyanosis or edema. DP/PT/Radials 2+ and equal bilaterally.  Lipid Panel     Component Value Date/Time   CHOL 196 02/16/2013 1243   TRIG 97.0 02/16/2013 1243   HDL 47.30 02/16/2013 1243   CHOLHDL 4 02/16/2013 1243   VLDL 19.4 02/16/2013 1243   LDLCALC 129* 02/16/2013 1243   Nuclear stress test: 02/15/2013 Impression  Exercise Capacity: Lexiscan with no exercise.  BP Response: Hypertensive blood pressure response.  Clinical Symptoms: Chest pain, headache.  ECG Impression: No significant ST segment change suggestive of ischemia.  Comparison with Prior Nuclear Study: No images to compare  Overall Impression: Normal stress nuclear study.  LV Ejection Fraction: 54%. LV Wall Motion: NL LV Function; NL Wall Motion    Loralie Champagne  02/12/2013   TTE 06/17/2013  Left ventricle: The cavity size was normal. There was mild concentric hypertrophy. Systolic function was normal. The estimated ejection fraction was in the range of 60% to 65%. Wall motion was normal; there were no regional wall motion abnormalities. Features are consistent with a pseudonormal left ventricular filling pattern, with concomitant abnormal relaxation and increased filling pressure (grade 2 diastolic dysfunction). Doppler parameters are consistent with high ventricular filling pressure. Impressions: - When compared to 2010, EF has improved.  Accessory Clinical Findings  ECG - normal sinus rhythm, 100 beats per minute, normal EKG.    Assessment & Plan  38 year old female with prior medical history of hyperlipidemia, hypertension and breast carcinoma with chemotherapy that can potentially cause heart block, heart failure and angina.  1. Paroxysmal SOB - we will discontinue Bystolic to 5 mg po daily, continue Clonidine to 0.3 mg po BID, add PRN sl NTG. We will start Imdur 30 mg po daily.  Holter monitor for 48 hours showed no arrhythmias or bradycardias.. Encouraged to exercise daily.  2. Chronic diastolic CHF, associated with dyspnea on exertion - A lexiscan nuclear stress test showed poor exercise capacity, LV EF 54% and no perfusion defect.  Echocardiogram showed preserved LV function it is improved compared to prior echo however she has pseudo-normal pattern of diastolic dysfunction with elevated filling pressures. We'll need to be more aggressive on blood pressure management. We will add Imdur 30 mg daily and possibly add hydralazine in the future if necessary.  3. HTN - finally controlled however intolerant of Bystolic, we will stop and add Imdur 30 mg daily and possibly add hydralazine in the future if necessary.  4. Lipid profile - LDL 129 just recommend exercise and diet.  5. 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

## 2013-09-29 ENCOUNTER — Telehealth: Payer: Self-pay | Admitting: *Deleted

## 2013-09-29 NOTE — Telephone Encounter (Signed)
Spoke with patient and informed her of Amy Berry's departure.  Confirmed new appointment for 10/06/13 at 230 for labs and 3pm with Dr. Lona Kettle.

## 2013-10-05 ENCOUNTER — Telehealth: Payer: Self-pay | Admitting: Oncology

## 2013-10-05 NOTE — Telephone Encounter (Signed)
DUE TO NO COVERAGE 7/8 APPT MOVED FROM CP2 TO LT. CALLED PT ON BOTH HOME AND CELL PHONES BUT WAS NOT ABLE TO REACH HER OR LM.

## 2013-10-06 ENCOUNTER — Other Ambulatory Visit: Payer: Self-pay | Admitting: *Deleted

## 2013-10-06 ENCOUNTER — Telehealth: Payer: Self-pay | Admitting: Oncology

## 2013-10-06 ENCOUNTER — Ambulatory Visit (HOSPITAL_BASED_OUTPATIENT_CLINIC_OR_DEPARTMENT_OTHER): Payer: Medicaid - Out of State | Admitting: Nurse Practitioner

## 2013-10-06 ENCOUNTER — Other Ambulatory Visit (HOSPITAL_BASED_OUTPATIENT_CLINIC_OR_DEPARTMENT_OTHER): Payer: Medicaid - Out of State

## 2013-10-06 VITALS — BP 164/98 | HR 97 | Temp 98.7°F | Resp 18 | Ht 65.0 in | Wt 215.9 lb

## 2013-10-06 DIAGNOSIS — C50412 Malignant neoplasm of upper-outer quadrant of left female breast: Secondary | ICD-10-CM

## 2013-10-06 DIAGNOSIS — C50419 Malignant neoplasm of upper-outer quadrant of unspecified female breast: Secondary | ICD-10-CM

## 2013-10-06 LAB — CBC WITH DIFFERENTIAL/PLATELET
BASO%: 0.7 % (ref 0.0–2.0)
BASOS ABS: 0 10*3/uL (ref 0.0–0.1)
EOS%: 2 % (ref 0.0–7.0)
Eosinophils Absolute: 0.1 10*3/uL (ref 0.0–0.5)
HCT: 40.4 % (ref 34.8–46.6)
HEMOGLOBIN: 13.3 g/dL (ref 11.6–15.9)
LYMPH%: 26.7 % (ref 14.0–49.7)
MCH: 30.5 pg (ref 25.1–34.0)
MCHC: 32.9 g/dL (ref 31.5–36.0)
MCV: 92.7 fL (ref 79.5–101.0)
MONO#: 0.4 10*3/uL (ref 0.1–0.9)
MONO%: 7.4 % (ref 0.0–14.0)
NEUT%: 63.2 % (ref 38.4–76.8)
NEUTROS ABS: 3.7 10*3/uL (ref 1.5–6.5)
PLATELETS: 225 10*3/uL (ref 145–400)
RBC: 4.36 10*6/uL (ref 3.70–5.45)
RDW: 14.1 % (ref 11.2–14.5)
WBC: 5.9 10*3/uL (ref 3.9–10.3)
lymph#: 1.6 10*3/uL (ref 0.9–3.3)

## 2013-10-06 LAB — COMPREHENSIVE METABOLIC PANEL (CC13)
ALBUMIN: 3.7 g/dL (ref 3.5–5.0)
ALK PHOS: 47 U/L (ref 40–150)
ALT: 39 U/L (ref 0–55)
AST: 28 U/L (ref 5–34)
Anion Gap: 8 mEq/L (ref 3–11)
BILIRUBIN TOTAL: 0.34 mg/dL (ref 0.20–1.20)
BUN: 5.5 mg/dL — AB (ref 7.0–26.0)
CO2: 25 mEq/L (ref 22–29)
Calcium: 9.2 mg/dL (ref 8.4–10.4)
Chloride: 105 mEq/L (ref 98–109)
Creatinine: 0.9 mg/dL (ref 0.6–1.1)
GLUCOSE: 111 mg/dL (ref 70–140)
Potassium: 3.5 mEq/L (ref 3.5–5.1)
Sodium: 138 mEq/L (ref 136–145)
Total Protein: 7.3 g/dL (ref 6.4–8.3)

## 2013-10-06 NOTE — Telephone Encounter (Signed)
Gave pt appt for lab,md and mammogram appt

## 2013-10-06 NOTE — Progress Notes (Signed)
  [facility] OFFICE PROGRESS NOTE   Diagnosis:  History of left breast cancer.  INTERVAL HISTORY:   Erica [name] returns as scheduled. To review, she was initially diagnosed in March 2010 with locally advanced left breast carcinoma. She received neoadjuvant docetaxel/cyclophosphamide for 4 cycles. She underwent a left lumpectomy and sentinel lymph node dissection in July 2010 for a ypT2 ypN1, stage IIB invasive ductal carcinoma, grade 3, triple negative with MIB-1 of 80%. She is status post full axillary dissection and margin clearance in August 2010. She completed 11 weeks of weekly paclitaxel in December 2010. Radiation therapy was completed in March 2011. She is now being followed on an observation approach. Most recent mammogram 02/05/2013 showed stable post lobectomy changes in the left breast and no mammographic features worrisome for malignancy.  She reports a poor appetite, stable weight. No fevers or sweats. She has intermittent nausea and is followed by Dr. Hilarie Fredrickson. She has chronic shortness of breath and is followed by Dr. Melvyn Novas. She continues to have intermittent chest pain. She recently saw her cardiologist. She has chronic "soreness" of the left breast. No changes over either breast. She denies any bleeding. She continues to have numbness and tingling in her hands and feet as well as tremors.  Objective:  Vital signs in last 24 hours:  Blood pressure 164/98, pulse 97, temperature 98.7 F (37.1 C), temperature source Oral, resp. rate 18, height 5\' 5"  (1.651 m), weight 215 lb 14.4 oz (97.932 kg), last menstrual period 05/21/2013.    HEENT: No thrush or ulcerations. Lymphatics: No palpable cervical, subclavicular or axillary lymph nodes. Resp: Lungs are clear. No wheezes or rales. Cardio: Regular cardiac rhythm. GI: Abdomen is soft and nontender. Obese. No organomegaly. Vascular: No leg edema. Breasts: No mass palpated in the right breast. Status post left lumpectomy.  Radiation skin change. No evidence of local tumor recurrence.    Lab Results:  Lab Results  Component Value Date   WBC 5.9 10/06/2013   HGB 13.3 10/06/2013   HCT 40.4 10/06/2013   MCV 92.7 10/06/2013   PLT 225 10/06/2013   NEUTROABS 3.7 10/06/2013    Imaging:  No results found.  Medications: I have reviewed the patient's current medications.  Assessment/Plan: 1. Locally advanced left breast carcinoma diagnosed March 2010.   She received neoadjuvant docetaxel/cyclophosphamide for 4 cycles.   She underwent a left lumpectomy and sentinel lymph node dissection in July 2010 for a ypT2 ypN1, stage IIB invasive ductal carcinoma, grade 3, triple negative with MIB-1 of 80%.   She is status post full axillary dissection and margin clearance in August 2010.   She completed 11 weeks of weekly paclitaxel in December 2010.   Radiation therapy was completed in March 2011.   Most recent mammogram 02/05/2013 showed stable post lobectomy changes in the left breast and no mammographic features worrisome for malignancy.    Disposition: Erica [name] remains in clinical remission from breast cancer. She will return for a followup visit in 6 months. She will contact the office in the interim with any problems.    Ned Card ANP/GNP-BC   10/06/2013  4:50 PM

## 2013-10-15 ENCOUNTER — Ambulatory Visit (INDEPENDENT_AMBULATORY_CARE_PROVIDER_SITE_OTHER): Payer: Self-pay | Admitting: Neurology

## 2013-10-15 ENCOUNTER — Encounter: Payer: Self-pay | Admitting: Neurology

## 2013-10-15 VITALS — BP 170/100 | HR 108 | Resp 16 | Ht 65.0 in | Wt 215.0 lb

## 2013-10-15 DIAGNOSIS — G620 Drug-induced polyneuropathy: Secondary | ICD-10-CM

## 2013-10-15 DIAGNOSIS — T451X5A Adverse effect of antineoplastic and immunosuppressive drugs, initial encounter: Secondary | ICD-10-CM

## 2013-10-15 DIAGNOSIS — G932 Benign intracranial hypertension: Secondary | ICD-10-CM

## 2013-10-15 NOTE — Progress Notes (Signed)
Follow-up Visit   Date: 10/15/2013    Erica [name] MRN: [id number] DOB: 15-May-1975   Interim History: Erica [name] is a 38 y.o. 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  The patient was accompanied to the clinic by self.  History of present illness: Starting in 2010 after her second course of chemotherapy she developed numbness/tingling of the arm and legs, described as her hands are asleep. She has burning sensation and shaking of her hands. Activity worsens pain and she has difficulty with fine motor tasks such as buttoning, tying shoe laces, and braiding her daughter's hair. She falls about once per month because her legs give out. She was started on Lyrica 50mg  TID around March which has helped slightly.   Of note, she history of pseudotumor cerebri, followed by ophthalmology, and currently takes diamox 500mg  BID. 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 She had lumbar puncture performed in the seated position with documented OP of 43 and 32cc of fluid removed. She had a second lumbar puncture in 2014 at which time OP was 18 mmHg.  UPDATE 10/15/2013:  No significant difference in pain since increasing Lyrica to 100mg  BID.  Overall, symptoms remain unchanged and she continues to have numbness/tingling of the hands and feet.  No new headaches, falls, hospitalizations, or illnesses.  Her blood pressure is elevated today, denies chest pain, SOB, vision changes.   Medications:  Current Outpatient Prescriptions on File Prior to Visit  Medication Sig Dispense Refill  . acetaZOLAMIDE (DIAMOX)  250 MG tablet Take 2 tablets (500 mg total) by mouth 2 (two) times daily.  60 tablet  0  . albuterol (PROVENTIL HFA;VENTOLIN HFA) 108 (90 BASE) MCG/ACT inhaler Inhale 2 puffs into the lungs every 4 (four) hours as needed for wheezing or shortness of breath (((PLAN A))).       Marland Kitchen albuterol (PROVENTIL) (2.5 MG/3ML) 0.083% nebulizer solution Take 3 mLs (2.5 mg total) by nebulization every 4 (four) hours as needed for wheezing or shortness of breath (((PLAN B))).  300 mL  5  . amLODipine-olmesartan (AZOR) 10-40 MG per tablet Take 1 tablet by mouth daily.  30 tablet  6  . Biotin 1000 MCG tablet Take 1,000 mcg by mouth daily.      . cloNIDine (CATAPRES) 0.3 MG tablet Take 1 tablet (0.3 mg total) by mouth 2 (two) times daily.  90 tablet  3  . dextromethorphan-guaiFENesin (MUCINEX DM) 30-600 MG per 12 hr tablet Take 1 tablet by mouth every 12 (twelve) hours as needed for cough.      Marland Kitchen HYDROcodone-acetaminophen (NORCO/VICODIN) 5-325 MG per tablet Take 1 tablet by mouth every 6 (six) hours as needed for moderate pain.  15 tablet  0  . hyoscyamine (LEVSIN SL) 0.125 MG SL tablet Place 1 tablet (0.125 mg total) under the tongue every 8 (eight) hours as needed for cramping.  30 tablet  3  . isosorbide mononitrate (IMDUR) 30 MG 24 hr tablet Take 1 tablet (30 mg total) by mouth daily.  90 tablet  3  . LORazepam (ATIVAN) 0.5 MG tablet Take 0.5 mg by mouth every 8 (eight) hours as needed for anxiety.      . metoCLOPramide (REGLAN) 10  MG tablet Take 1 tablet (10 mg total) by mouth 4 (four) times daily -  with meals and at bedtime.  120 tablet  3  . mometasone (NASONEX) 50 MCG/ACT nasal spray Place 2 sprays into the nose daily.  17 g  12  . mometasone-formoterol (DULERA) 200-5 MCG/ACT AERO Inhale 2 puffs into the lungs 2 (two) times daily.  13 g  5  . montelukast (SINGULAIR) 10 MG tablet Take 1 tablet (10 mg total) by mouth at bedtime.  30 tablet  5  . nitroGLYCERIN (NITROSTAT) 0.4 MG SL tablet Place 1 tablet (0.4 mg  total) under the tongue every 5 (five) minutes as needed for chest pain.  90 tablet  3  . ondansetron (ZOFRAN ODT) 4 MG disintegrating tablet 4mg  ODT q6 hours prn nausea/vomit  10 tablet  0  . pantoprazole (PROTONIX) 40 MG tablet Take 1 tablet (40 mg total) by mouth 2 (two) times daily before a meal.  60 tablet  3  . potassium chloride SA (K-DUR,KLOR-CON) 20 MEQ tablet Take 20 mEq by mouth 2 (two) times daily.      . pregabalin (LYRICA) 100 MG capsule Take 1 capsule (100 mg total) by mouth 2 (two) times daily.  60 capsule  5  . sucralfate (CARAFATE) 1 G tablet Take 1 tablet (1 g total) by mouth 4 (four) times daily.  30 tablet  0  . sucralfate (CARAFATE) 1 G tablet Take 1 tablet (1 g total) by mouth 4 (four) times daily -  with meals and at bedtime.  30 tablet  0  . traMADol (ULTRAM) 50 MG tablet 1-2 every 4 hours as needed for cough or pain      . triamterene-hydrochlorothiazide (MAXZIDE-25) 37.5-25 MG per tablet Take 1 tablet by mouth every morning.       No current facility-administered medications on file prior to visit.    Allergies:  Allergies  Allergen Reactions  . Aspirin Shortness Of Breath and Palpitations    Pt can take ibuprofen without reaction  . Penicillins Shortness Of Breath and Palpitations     Review of Systems:  CONSTITUTIONAL: No fevers, chills, night sweats, or weight loss.   EYES: No visual changes or eye pain ENT: No hearing changes.  No history of nose bleeds.   RESPIRATORY: No cough, wheezing and shortness of breath.   CARDIOVASCULAR: Negative for chest pain, and palpitations.   GI: Negative for abdominal discomfort, blood in stools or black stools.  No recent change in bowel habits.   GU:  No history of incontinence.   MUSCLOSKELETAL: No history of joint pain or swelling.  No myalgias.   SKIN: Negative for lesions, rash, and itching.   ENDOCRINE: Negative for cold or heat intolerance, polydipsia or goiter.   PSYCH:  No depression or anxiety symptoms.     NEURO: As Above.   Vital Signs:  BP 170/100  Pulse 108  Resp 16  Ht 5\' 5"  (1.651 m)  Wt 215 lb (97.523 kg)  BMI 35.78 kg/m2  SpO2 96%  LMP 05/21/2013  Neurological Exam: MENTAL STATUS including orientation to time, place, person, recent and remote memory, attention span and concentration, language, and fund of knowledge is normal.  Speech is not dysarthric.  CRANIAL NERVES: No visual field defects. Pupils equal round and reactive to light.  Normal conjugate, extra-ocular eye movements in all directions of gaze.  No ptosis.  MOTOR:  Motor strength is 5/5 in all extremities. No drift.  MSRs:  Reflexes are 2+/4  throughout.  SENSORY:  Intact to vibration throughout, mild asymmetric with 20% reduction at right knee.  COORDINATION/GAIT:   Gait narrow based and stable.   Data: Lab Results   Component  Value  Date    TSH  0.90  07/14/2013    Lab Results   Component  Value  Date    [id number]  287  09/18/2011   04/10/2011: 1hr GTT - normal  Lab Results   Component  Value  Date    CREATININE  1.03  07/27/2013    CSF 09/18/2011: R269 W0 G65 P17, CSF cytology-neg OP 43, 32cc removed   LP 02/05/2013: OP 18, CP 17 after 21 cc removed   Labs 09/17/2011: vitamin B12 725, RPR NR   CT head 02/05/2013: No acute abnormality   MRI brain 08/13/2010:  1. No evidence for metastatic disease to the brain.  2. Scattered subcortical T2 and FLAIR hyperintensities are slightly greater than expected for age. The finding is nonspecific  but can be seen in the setting of chronic microvascular ischemia, a demyelinating process such as multiple sclerosis, vasculitis, complicated migraine headaches, or as the sequelae of a prior infectious or inflammatory process. The lesions could be related to prior brain radiation if this has been performed.   IMPRESSION/PLAN: Ms. Ziesmer is a 38 year-old female returning for evaluation for paresthesias of her hands and feet which started following chemotherapy with  docetaxel/cyclophosphamide (2010). She has predominately small fiber symptoms of pain and tingling and seems to have mild improvement with Lyrica initially.  After increasing the dose to 100mg  BID, she reports no additional benefit.  I will further optimize increase Lyrica to 200mg  BID as she is not experiencing side effects. If there is no improvement, TCAs, gabapentin, topical ointments can be tried.   For completeness, I'll check vitamin B12 and copper levels to be sure other secondary causes of neuropathy have been evaluated (she did not have these checked after the last visit).   Regarding her pseudotumor cerebri, she is clinically stable and diamox 500mg  BID can be continued.   She was instructed to call my office in 2 weeks with an update to determine further titration of Lyrica and I'll see her back in 44-months.  Blood pressure is elevated at today's visit.  She is clinically stable so I have asked her to record her pressure at home and follow-up with PCP next week.  She will call with update in 50-month to determine further uptitration of Lyrica and return to clinic in 40-months.   The duration of this appointment visit was 15 minutes of face-to-face time with the patient.  Greater than 50% of this time was spent in counseling, explanation of diagnosis, planning of further management, and coordination of care.   Thank you for allowing me to participate in patient's care.  If I can answer any additional questions, I would be pleased to do so.    Sincerely,    Donika K. Posey Pronto, DO

## 2013-10-15 NOTE — Patient Instructions (Addendum)
Start taking Lyrica 100mg  in the morning and 200mg  (2 tab) in the evening x 1 week, then increase to 2 tablets morning and evening. Check blood work Call me with update in 7-month Follow-up with PCP regarding elevated blood pressure Return clinic in 28-months

## 2013-10-16 LAB — VITAMIN B12: VITAMIN B 12: 605 pg/mL (ref 211–911)

## 2013-10-19 LAB — COPPER, SERUM: COPPER: 124 ug/dL (ref 70–175)

## 2013-10-22 ENCOUNTER — Emergency Department (HOSPITAL_COMMUNITY)
Admission: EM | Admit: 2013-10-22 | Discharge: 2013-10-22 | Disposition: A | Discharge status: Home / Self Care | Payer: Medicaid - Out of State | Attending: Emergency Medicine | Admitting: Emergency Medicine

## 2013-10-22 DIAGNOSIS — Z862 Personal history of diseases of the blood and blood-forming organs and certain disorders involving the immune mechanism: Secondary | ICD-10-CM | POA: Insufficient documentation

## 2013-10-22 DIAGNOSIS — Z79899 Other long term (current) drug therapy: Secondary | ICD-10-CM | POA: Insufficient documentation

## 2013-10-22 DIAGNOSIS — Z880 Allergy status to penicillin: Secondary | ICD-10-CM | POA: Insufficient documentation

## 2013-10-22 DIAGNOSIS — J45909 Unspecified asthma, uncomplicated: Secondary | ICD-10-CM | POA: Insufficient documentation

## 2013-10-22 DIAGNOSIS — I10 Essential (primary) hypertension: Secondary | ICD-10-CM | POA: Diagnosis not present

## 2013-10-22 DIAGNOSIS — R51 Headache: Secondary | ICD-10-CM | POA: Insufficient documentation

## 2013-10-22 DIAGNOSIS — G932 Benign intracranial hypertension: Secondary | ICD-10-CM | POA: Diagnosis not present

## 2013-10-22 DIAGNOSIS — Z853 Personal history of malignant neoplasm of breast: Secondary | ICD-10-CM | POA: Insufficient documentation

## 2013-10-22 MED ORDER — MORPHINE SULFATE 4 MG/ML IJ SOLN
4.0000 mg | INTRAMUSCULAR | Status: DC | PRN
  Administered 2013-10-22: 4 mg via INTRAVENOUS
  Filled 2013-10-22: qty 1

## 2013-10-22 MED ORDER — OXYCODONE-ACETAMINOPHEN 5-325 MG PO TABS: 2.0000 | ORAL_TABLET | ORAL | Status: DC | PRN

## 2013-10-22 MED ORDER — ONDANSETRON HCL 4 MG PO TABS: 4.0000 mg | ORAL_TABLET | Freq: Four times a day (QID) | ORAL | Status: DC

## 2013-10-22 MED ORDER — ONDANSETRON HCL 4 MG/2ML IJ SOLN
4.0000 mg | Freq: Once | INTRAMUSCULAR | Status: AC
  Administered 2013-10-22: 4 mg via INTRAVENOUS
  Filled 2013-10-22: qty 2

## 2013-10-22 MED ORDER — CLONIDINE HCL 0.1 MG PO TABS
0.2000 mg | ORAL_TABLET | Freq: Once | ORAL | Status: AC
  Administered 2013-10-22: 0.2 mg via ORAL
  Filled 2013-10-22: qty 2

## 2013-10-22 NOTE — Discharge Instructions (Signed)
Go to [facility] tomorrow at [place] in at [place] to be registered as an out patient to go to Radiology for a Lumbar Puncture under Fluoroscopy by the radiologist.  Pseudotumor Cerebri Pseudotumor cerebri, also called idiopathic intracranial hypertension, is a condition that occurs due to increased pressure within your skull. Although some of the symptoms resemble those of a brain tumor, it is not a brain tumor. Symptoms occur when the increased pressure in your skull compresses brain structures. For example, pressure on the nerve responsible for vision (optic nerve) causes it to swell, resulting in visual symptoms. Pseudotumor cerebri tends to occur in obese women younger than 40 years of age. However, men and children can also develop this condition. SYMPTOMS  Symptoms of pseudotumor cerebri occur due to increased pressure within the skull. Symptoms may include:   Headaches.  Nausea and vomiting.  Dizziness.  High blood pressure.   Ringing in the ears.  Double or blurred vision.  Brief episodes of complete loss of vision.  Pain in the back, neck, or shoulders. DIAGNOSIS  Pseudotumor cerebri is diagnosed through:  A detailed eye exam, which can reveal a swollen optic nerve, as well as identifying issues such as blind spots in the vision.  An MRI or CT scan to rule out other disorders that can cause similar symptoms, such as brain tumors.  A spinal tap (lumbar puncture), which can demonstrate increased pressure within the skull. TREATMENT  There are several ways that pseudotumor cerebri is treated, including:  Medicines to decrease the production of spinal fluid and lower the pressure within your skull.  Medicines to prevent or treat headaches.  Surgery to create an opening in your optic nerve to allow excess fluid to drain out.  Surgery to place drains (shunts) in your brain to remove excess fluid. HOME CARE INSTRUCTIONS   Take all medicines as directed  by your health care provider.  Go to all of your follow-up appointments.  Lose weight if you are overweight. SEEK MEDICAL CARE IF:  Any symptoms come back.  You develop trouble with hearing, vision, balance, or your sense of smell.  You cannot eat or drink what you need.  You are more weak or tired than usual.   You are losing weight without trying. SEEK IMMEDIATE MEDICAL CARE IF:  You have new symptoms such as vision problems or difficulty walking.   You have a seizure.   You have trouble breathing.   You have a fever.  Document Released: 03/23/2013 Document Reviewed: 03/23/2013 [facility] Patient Information 2015 [place], [place]. This information is not intended to replace advice given to you by your health care provider. Make sure you discuss any questions you have with your health care provider.

## 2013-10-22 NOTE — ED Provider Notes (Signed)
CSN: [id number]     Arrival date & time 10/22/13  1725 History   First MD Initiated Contact with Patient 10/22/13 1733     Chief Complaint  Patient presents with  . Headache  . Blurred Vision      HPI  Patient presents with a headache and blurry vision. History of pseudotumor cerebri. Has undergone therapeutic lumbar puncture in the past. At times as been as high as 40 or 50 with her pressure. She does require 22, and 32 mL of fluid to be removed in the past. She became symptomatic 4-5 days ago with slowly increasing headache. Mild blurring of her vision. 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  Past Medical History  Diagnosis Date  . Breast cancer 2010  . Asthma   . Hypertension   . GERD (gastroesophageal reflux disease)   . Impaired glucose tolerance 04/13/2011  . Anemia, unspecified 04/13/2011  . Asthma 09/27/2010  . Bloating 03/18/2011  . 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 Lymphadenitis, chronic     restricted LEFT extremity   Past Surgical History  Procedure Laterality Date  . Hand surgery  2012    right; tendon release  . Breast lumpectomy  09/2008    left  . Carpal tunnel release Left 2011  . Tubal ligation  05/11/2011    Procedure: POST PARTUM TUBAL LIGATION;  Surgeon: Emeterio Reeve, MD;  Location: [facility];  Service: Gynecology;  Laterality: Bilateral;  Induced for HTN  . Lymph node dissection  2010    left breast; 2 wks after breast lumpectomy   Family History  Problem Relation Age of Onset  . Diabetes Maternal Grandmother   . Heart disease Maternal Grandmother   . Hypertension Maternal Grandmother   . Cancer Neg Hx   . Alcohol abuse Neg Hx   . Early death Neg Hx   . Hyperlipidemia Neg Hx   . Kidney disease Neg Hx   . Stroke Neg Hx   . Colon cancer Neg Hx    History  Substance Use Topics  . Smoking status: Never Smoker   . Smokeless tobacco: Never Used  . Alcohol Use: 0.0 oz/week     Comment: occasionally    OB History   Grav Para Term Preterm Abortions TAB SAB Ect Mult Living   5 4 4  1  1   4      Review of Systems  Constitutional: Negative for fever, chills, diaphoresis, appetite change and fatigue.  HENT: Negative for mouth sores, sore throat and trouble swallowing.   Eyes: Negative for visual disturbance.  Respiratory: Negative for cough, chest tightness, shortness of breath and wheezing.   Cardiovascular: Negative for chest pain.  Gastrointestinal: Negative for nausea, vomiting, abdominal pain, diarrhea and abdominal distention.  Endocrine: Negative for polydipsia, polyphagia and polyuria.  Genitourinary: Negative for dysuria, frequency and hematuria.  Musculoskeletal: Negative for gait problem.  Skin: Negative for color change, pallor and rash.  Neurological: Positive for headaches. Negative for dizziness, syncope and light-headedness.       Blurry vision, light sensitivity.  Hematological: Does not bruise/bleed easily.  Psychiatric/Behavioral: Negative for behavioral problems and confusion.      Allergies  Aspirin and Penicillins  Home Medications   Prior to Admission medications   Medication Sig Start Date End Date Taking? Authorizing Provider  acetaZOLAMIDE (DIAMOX) 250 MG tablet Take 2 tablets (500 mg total) by mouth 2 (two) times daily. 02/05/13  Yes Saddie Benders.  Ghim, MD  albuterol (PROVENTIL HFA;VENTOLIN HFA) 108 (90 BASE) MCG/ACT inhaler Inhale 2 puffs into the lungs every 4 (four) hours as needed for wheezing or shortness of breath (((PLAN A))).    Yes Historical Provider, MD  albuterol (PROVENTIL) (2.5 MG/3ML) 0.083% nebulizer solution Take 3 mLs (2.5 mg total) by nebulization every 4 (four) hours as needed for wheezing or shortness of breath (((PLAN B))). 04/06/13  Yes Tammy S Parrett, NP  amLODipine-olmesartan (AZOR) 10-40 MG per tablet Take 1 tablet by mouth daily. 06/23/13  Yes [name], MD  Biotin 1000 MCG tablet Take 1,000 mcg by mouth daily.   Yes  Historical Provider, MD  cloNIDine (CATAPRES) 0.3 MG tablet Take 1 tablet (0.3 mg total) by mouth 2 (two) times daily. 08/24/13  Yes [name], MD  dextromethorphan-guaiFENesin [facility] DM) 30-600 MG per 12 hr tablet Take 1 tablet by mouth every 12 (twelve) hours as needed for cough.   Yes Historical Provider, MD  HYDROcodone-acetaminophen (NORCO/VICODIN) 5-325 MG per tablet Take 1 tablet by mouth every 6 (six) hours as needed for moderate pain. 09/15/13  Yes Osvaldo Shipper, MD  hyoscyamine (LEVSIN SL) 0.125 MG SL tablet Place 1 tablet (0.125 mg total) under the tongue every 8 (eight) hours as needed for cramping. 09/02/13  Yes Jerene Bears, MD  isosorbide mononitrate (IMDUR) 30 MG 24 hr tablet Take 1 tablet (30 mg total) by mouth daily. 09/27/13  Yes [name], MD  LORazepam (ATIVAN) 0.5 MG tablet Take 0.5 mg by mouth every 8 (eight) hours as needed for anxiety. 01/07/13  Yes Chauncey Cruel, MD  metoCLOPramide (REGLAN) 10 MG tablet Take 1 tablet (10 mg total) by mouth 4 (four) times daily -  with meals and at bedtime. 01/26/13  Yes Jerene Bears, MD  mometasone (NASONEX) 50 MCG/ACT nasal spray Place 2 sprays into the nose daily. 07/27/13  Yes Julianne Rice, MD  mometasone-formoterol [facility]) 200-5 MCG/ACT AERO Inhale 2 puffs into the lungs 2 (two) times daily. 04/06/13  Yes Tammy S Parrett, NP  montelukast (SINGULAIR) 10 MG tablet Take 1 tablet (10 mg total) by mouth at bedtime. 07/14/13  Yes Tanda Rockers, MD  nitroGLYCERIN (NITROSTAT) 0.4 MG SL tablet Place 1 tablet (0.4 mg total) under the tongue every 5 (five) minutes as needed for chest pain. 08/24/13  Yes [name], MD  ondansetron (ZOFRAN-ODT) 4 MG disintegrating tablet Take 4 mg by mouth every 6 (six) hours as needed for nausea or vomiting. 09/15/13  Yes Osvaldo Shipper, MD  pantoprazole (PROTONIX) 40 MG tablet Take 1 tablet (40 mg total) by mouth 2 (two) times daily before a meal. 09/02/13  Yes Jerene Bears, MD   potassium chloride SA (K-DUR,KLOR-CON) 20 MEQ tablet Take 20 mEq by mouth 2 (two) times daily. 06/04/12  Yes [name], MD  pregabalin (LYRICA) 100 MG capsule Take 1 capsule (100 mg total) by mouth 2 (two) times daily. 08/24/13  Yes Donika K Patel, DO  sucralfate (CARAFATE) 1 G tablet Take 1 tablet (1 g total) by mouth 4 (four) times daily. 03/07/12  Yes [name], MD  triamterene-hydrochlorothiazide (MAXZIDE-25) 37.5-25 MG per tablet Take 1 tablet by mouth every morning.   Yes Historical Provider, MD  ondansetron (ZOFRAN) 4 MG tablet Take 1 tablet (4 mg total) by mouth every 6 (six) hours. 10/22/13   Tanna Furry, MD  oxyCODONE-acetaminophen (PERCOCET/ROXICET) 5-325 MG per tablet Take 2 tablets by mouth every 4 (four)  hours as needed. 10/22/13   Tanna Furry, MD   BP 189/98  Pulse 100  Temp(Src) 98.7 F (37.1 C) (Oral)  Resp 16  SpO2 95% Physical Exam  Constitutional: She is oriented to person, place, and time. She appears well-developed and well-nourished. No distress.  HENT:  Head: Normocephalic.  Eyes: Conjunctivae are normal. Pupils are equal, round, and reactive to light. No scleral icterus.  No papilledema.  Neck: Normal range of motion. Neck supple. No thyromegaly present.  Supple neck  Cardiovascular: Normal rate and regular rhythm.  Exam reveals no gallop and no friction rub.   No murmur heard. Pulmonary/Chest: Effort normal and breath sounds normal. No respiratory distress. She has no wheezes. She has no rales.  Abdominal: Soft. Bowel sounds are normal. She exhibits no distension. There is no tenderness. There is no rebound.  Musculoskeletal: Normal range of motion.  Neurological: She is alert and oriented to person, place, and time.  Reported chronically poor right vision. Normal extraocular movements. Neurological exam otherwise intact  Skin: Skin is warm and dry. No rash noted.  Psychiatric: She has a normal mood and affect. Her behavior is normal.    ED Course   Procedures (including critical care time) Labs Review Labs Reviewed - No data to display  Imaging Review No results found.   EKG Interpretation None      MDM   Final diagnoses:  Pseudotumor cerebri    Attempt at lumbar puncture x3 are unsuccessful. Range of motion is made for patient to go to [place] tomorrow morning for outpatient lumbar puncture in radiology under fluoroscopy. Spoke with radiologist regarding this. I've ordered the her seizure in epic. I've given her a piece of paper with her discharge instructions. Instructed to go to ER registration. Shortness now patient to go to radiology for the procedure.    Tanna Furry, MD 10/22/13 [phone]

## 2013-10-22 NOTE — ED Notes (Signed)
Patient reports HA x4 days, blurred vision x2 days, patient reports mild sensitivity to light.

## 2013-10-22 NOTE — ED Notes (Signed)
Patient reports sister will be here soon to transport. Informed patient is not to drive due to Morphine administration. Patient verbalizes understanding.

## 2013-10-23 ENCOUNTER — Ambulatory Visit (HOSPITAL_COMMUNITY): Admission: EM | Admit: 2013-10-23 | Payer: Medicaid - Out of State | Source: Ambulatory Visit

## 2013-10-25 ENCOUNTER — Ambulatory Visit (HOSPITAL_COMMUNITY)
Admission: RE | Admit: 2013-10-25 | Discharge: 2013-10-25 | Disposition: A | Discharge status: Home / Self Care | Payer: Medicaid - Out of State | Source: Ambulatory Visit | Attending: Emergency Medicine | Admitting: Emergency Medicine

## 2013-10-25 DIAGNOSIS — R51 Headache: Secondary | ICD-10-CM | POA: Diagnosis not present

## 2013-10-25 MED ORDER — ACETAMINOPHEN 325 MG PO TABS
650.0000 mg | ORAL_TABLET | Freq: Once | ORAL | Status: AC
  Administered 2013-10-25: 650 mg via ORAL

## 2013-10-25 MED ORDER — ACETAMINOPHEN 325 MG PO TABS
ORAL_TABLET | ORAL | Status: DC
Start: 2013-10-25 — End: 2013-10-26
  Filled 2013-10-25: qty 2

## 2013-10-25 NOTE — Procedures (Signed)
CLINICAL DATA: [Pseudotumor cerebri.] EXAM: DIAGNOSTIC LUMBAR PUNCTURE UNDER FLUOROSCOPIC GUIDANCE FLUOROSCOPY TIME: [1 min and 28 seconds] PROCEDURE: Informed consent was obtained from the patient prior to the procedure, including potential complications of headache, allergy, and pain. With the patient prone, the lower back was prepped with Betadine. 1% Lidocaine was used for local anesthesia. Lumbar puncture was performed at the [L3-4] level using a [22] gauge needle with return of [clear] CSF with an opening pressure of [26] cm water. [Fifteen]ml of CSF were obtained for laboratory studies. Closing pressure of 6 cm of CSF. The patient tolerated the procedure well and there were no apparent complications. IMPRESSION: [Uncomplicated lumbar puncture, as detailed above.]

## 2013-10-25 NOTE — Discharge Instructions (Signed)
Myelogram and Lumbar Puncture Discharge Instructions ° °1. Go home and rest quietly for the next 24 hours.  It is important to lie flat for the next 24 hours.  Get up only to go to the restroom.  You may lie in the bed or on a couch on your back, your stomach, your left side or your right side.  You may have one pillow under your head.  You may have pillows between your knees while you are on your side or under your knees while you are on your back. ° °2. DO NOT drive today.  Recline the seat as far back as it will go, while still wearing your seat belt, on the way home. ° °3. You may get up to go to the bathroom as needed.  You may sit up for 10 minutes to eat.  You may resume your normal diet and medications unless otherwise indicated. ° °4. The incidence of headache, nausea, or vomiting is about 5% (one in 20 patients).  If you develop a headache, lie flat and drink plenty of fluids until the headache goes away.  Caffeinated beverages may be helpful.  If you develop severe nausea and vomiting or a headache that does not go away with flat bed rest, call the office. ° °5. You may resume normal activities after your 24 hours of bed rest is over; however, do not exert yourself strongly or do any heavy lifting tomorrow. ° °6. Call your physician for a follow-up appointment.  The results of your myelogram will be sent directly to your physician by the following day. ° °7. If you have any questions or if complications develop after you arrive home, please call the office. ° °Discharge instructions have been explained to the patient.  The patient, or the person responsible for the patient, fully understands these instructions. ° ° °

## 2013-10-27 ENCOUNTER — Encounter (HOSPITAL_COMMUNITY): Payer: Self-pay | Admitting: Emergency Medicine

## 2013-10-27 ENCOUNTER — Emergency Department (HOSPITAL_COMMUNITY)
Admission: EM | Admit: 2013-10-27 | Discharge: 2013-10-27 | Disposition: A | Discharge status: Home / Self Care | Payer: Medicaid - Out of State | Attending: Emergency Medicine | Admitting: Emergency Medicine

## 2013-10-27 DIAGNOSIS — K219 Gastro-esophageal reflux disease without esophagitis: Secondary | ICD-10-CM | POA: Insufficient documentation

## 2013-10-27 DIAGNOSIS — Z880 Allergy status to penicillin: Secondary | ICD-10-CM | POA: Insufficient documentation

## 2013-10-27 DIAGNOSIS — G971 Other reaction to spinal and lumbar puncture: Secondary | ICD-10-CM

## 2013-10-27 DIAGNOSIS — Z79899 Other long term (current) drug therapy: Secondary | ICD-10-CM | POA: Diagnosis not present

## 2013-10-27 DIAGNOSIS — Z862 Personal history of diseases of the blood and blood-forming organs and certain disorders involving the immune mechanism: Secondary | ICD-10-CM | POA: Diagnosis not present

## 2013-10-27 DIAGNOSIS — I10 Essential (primary) hypertension: Secondary | ICD-10-CM | POA: Diagnosis not present

## 2013-10-27 DIAGNOSIS — Z853 Personal history of malignant neoplasm of breast: Secondary | ICD-10-CM | POA: Diagnosis not present

## 2013-10-27 DIAGNOSIS — J45909 Unspecified asthma, uncomplicated: Secondary | ICD-10-CM | POA: Insufficient documentation

## 2013-10-27 MED ORDER — OXYCODONE-ACETAMINOPHEN 5-325 MG PO TABS: 2.0000 | ORAL_TABLET | Freq: Three times a day (TID) | ORAL | Status: DC | PRN

## 2013-10-27 MED ORDER — ONDANSETRON HCL 4 MG/2ML IJ SOLN
4.0000 mg | Freq: Once | INTRAMUSCULAR | Status: AC
  Administered 2013-10-27: 4 mg via INTRAVENOUS
  Filled 2013-10-27: qty 2

## 2013-10-27 MED ORDER — HYDROMORPHONE HCL PF 1 MG/ML IJ SOLN
1.0000 mg | Freq: Once | INTRAMUSCULAR | Status: AC
  Administered 2013-10-27: 1 mg via INTRAVENOUS
  Filled 2013-10-27: qty 1

## 2013-10-27 MED ORDER — SODIUM CHLORIDE 0.9 % IV BOLUS (SEPSIS): 1000.0000 mL | Freq: Once | INTRAVENOUS | Status: DC

## 2013-10-27 MED ORDER — ONDANSETRON 4 MG PO TBDP: 4.0000 mg | ORAL_TABLET | Freq: Four times a day (QID) | ORAL | Status: DC | PRN

## 2013-10-27 NOTE — Discharge Instructions (Signed)
Radiology department will call you tomorrow morning to schedule a blood patch procedure for your headache.  Take your home medication as prescribed.  Take zofran for nausea.  Return if you have any concerns.    Spinal Headache A spinal headache is a severe headache that can happen after getting a spinal tap, also called lumbar puncture, or an epidural anesthetic. Both of these procedures involve passing a needle through ligaments that run along the back side of your spinal column and into one of the spaces just above your spinal cord. Sometimes spinal fluid leaks through the temporary hole left by the needle. This leak causes a decrease in spinal fluid pressure, which leads to a spinal headache. The headache usually begins within hours or 1-2 days after the procedure, and it lasts until adequate pressure returns as your body creates more spinal fluid. The headache can last a few days and rarely lasts for more than 1 week. SIGNS AND SYMPTOMS   Severe headache pain when sitting or standing.  Decreased headache pain when lying down.  Neck pain, especially when flexing the neck in a chin-to-chest position.  Vomiting. DIAGNOSIS  Diagnosis of spinal headache is usually made based on your recent medical history. Your health care provider will consider the timing of a recent spinal tap or epidural anesthetic, along with how soon your headache occurred afterward. On rare occasions, tests may be done to confirm the diagnosis, such as an MRI. TREATMENT  Treatment may include:  Drinking extra fluids to improve your level of hydration. This will help your body replace the spinal fluid that has leaked out through the needle hole. Receiving IV fluids may be necessary.  Taking pain medicine as prescribed by your health care provider.  Drinking caffeinated beverages such as soda, coffee, or tea. Caffeine may help to shrink the blood vessels in your brain, which may reduce your headache pain.  Lying flat for a  few days.  Having a blood patch procedure, which involves injecting a small amount of your blood at the puncture site to seal the leak. HOME CARE INSTRUCTIONS  Lie down to relieve pain if your pain gets worse when you sit or stand.  Drink enough fluids to keep your urine clear or pale yellow.  Take pain medicine as directed by your health care provider. SEEK IMMEDIATE MEDICAL CARE IF:   Your pain becomes very severe or cannot be controlled.  You develop a fever.  You have a stiff neck.  You lose bowel or bladder control.  You have trouble walking. MAKE SURE YOU:  Understand these instructions.  Will watch your condition.   Will get help right away if you are not doing well or get worse. Document Released: 09/07/2001 Document Revised: 03/23/2013 Document Reviewed: 10/08/2012 [facility] Patient Information 2015 [place], [place]. This information is not intended to replace advice given to you by your health care provider. Make sure you discuss any questions you have with your health care provider.   Emergency Department Resource Guide 1) Find a Doctor and Pay Out of Pocket Although you won't have to find out who is covered by your insurance plan, it is a good idea to ask around and get recommendations. You will then need to call the office and see if the doctor you have chosen will accept you as a new patient and what types of options they offer for patients who are self-pay. Some doctors offer discounts or will set up payment plans for their patients who do not  have insurance, but you will need to ask so you aren't surprised when you get to your appointment.  2) Contact Your Local Health Department Not all health departments have doctors that can see patients for sick visits, but many do, so it is worth a call to see if yours does. If you don't know where your local health department is, you can check in your phone book. The CDC also has a tool to help you locate your state's health  department, and many state websites also have listings of all of their local health departments.  3) Find a [facility] If your illness is not likely to be very severe or complicated, you may want to try a walk in clinic. These are popping up all over the country in pharmacies, drugstores, and shopping centers. They're usually staffed by nurse practitioners or physician assistants that have been trained to treat common illnesses and complaints. They're usually fairly quick and inexpensive. However, if you have serious medical issues or chronic medical problems, these are probably not your best option.  No Primary Care Doctor: - Call Health Connect at  [phone] - they can help you locate a primary care doctor that  accepts your insurance, provides certain services, etc. - Physician Referral Service- [phone]  Chronic Pain Problems: Organization         Address  Phone   Notes  [facility]  [phone] Patients need to be referred by their primary care doctor.   Medication Assistance: Organization         Address  Phone   Notes  [facility] Medication [facility] [address] [place], [place] [address] [phone] --Must be a resident of [facility] -- Must have NO insurance coverage whatsoever (no Medicaid/ Medicare, etc.) -- The pt. MUST have a primary care doctor that directs their care regularly and follows them in the community   MedAssist  [phone]   [employer]  [phone]    Agencies that provide inexpensive medical care: Organization         Address  Phone   Notes  [place]  [phone]   [facility]    [phone]   [facility] [place], [place] [address] [phone]   [place] [address], [place] [phone]   Planned Parenthood    [phone]   [facility]    [phone]    [place] [address], [place] Phone:  [phone], Fax:  [phone] Hours of Operation:  9 am - 6 pm, M-F.  Also accepts Medicaid/Medicare and self-pay.  [facility] for [place], [address], [place] Phone: [phone], Fax: [phone]. Hours of Operation:  8:30 am - 5:30 pm, M-F.  Also accepts Medicaid and self-pay.  [facility] [place] [address], [place] Phone: [phone]   [place], [place], [place] [phone], Ext. [phone] Mondays & Thursdays: 7-9 AM.  First 15 patients are seen on a first come, first serve basis.    [place] Providers:  Organization         Address  Phone   Notes  [facility] [address], [place] [phone] Also accepts self-pay patients.  [place]  [address] Dolores Patty [place], [place]  [phone]   [place], [address], [place] [phone]   [place] [address] [place] [phone]   Lucianne Lei [address], [place]   [phone] Only accepts [place] Access [place] patients after they have their name applied to their card.   Self-Pay (no insurance) in [facility]:  Organization         Address  Phone   Notes  Sickle Cell Patients, [facility] [place] [phone]   [facility]gent Care [place] [phone]   [facility] [place]  [place], [place], [place] [phone]   [facility]/Dr. Osei-Bonsu  [address], [place] or [address], [place] [phone] Phone number for both [place] and [place] locations is the same.  Urgent Medical and [facility] [address] [place] [phone]    [facility] [address] [place] or [address] [phone]   [facility] [address], [place] [phone], phone; [phone], fax Sees patients 1st and 3rd Saturday of every month.  Must not qualify for public or private insurance (i.e. Medicaid, Medicare, [place] Health Choice, Veterans' Benefits)  Household income should be no more than 200% of the poverty level The clinic cannot treat you if you are pregnant or think you are pregnant  Sexually transmitted diseases are not treated at the clinic.    Dental Care: Organization         Address  Phone  Notes  [facility] Department of [facility] [phone] Accepts children up to age 61 who are enrolled in [place] or [place]; pregnant women with a Medicaid card; and children who have applied for Medicaid or [place] Health Choice, but were declined, whose parents can pay a reduced fee at time of service.  [facility] Department of [facility]  [address], [place] [phone] Accepts children up to age 38 who are enrolled in [place] or [place]; pregnant women with a Medicaid card; and children who have applied for Medicaid or [place] Health Choice, but were declined, whose parents can pay a reduced fee at time of service.  [facility] Adult [facility] Access PROGRAM  [place] [phone] Patients are seen by appointment only. Walk-ins are not accepted. [place] will see patients 85 years of age and older. Monday - Tuesday (8am-5pm) Most Wednesdays (8:30-5pm) $30 per visit, cash only  [facility] Adult Dental Access PROGRAM  [address], [facility] [phone] Patients are seen by appointment only. Walk-ins are not accepted. [place] will see patients 34 years of age and older. One Wednesday Evening (Monthly: Volunteer Based).   $30 per visit, cash only  [place]  [phone] for adults; Children under age 69, call Graduate Pediatric Dentistry at [phone]. Children aged 13-14, please call [phone] to request a pediatric application.  Dental services are provided in all areas of dental care including fillings, crowns and bridges, complete and partial dentures, implants, gum treatment, root canals, and extractions. Preventive care is also provided. Treatment is provided to both adults and children. Patients are selected  via a lottery and there is often a waiting list.   [facility] [address] [place]  [phone] [email or web address]   [facility] [address] [place], [place] [phone], Ext. [phone] Second and Fourth Thursday of each month, opens at 6:30 AM; Clinic ends at 9 AM.  Patients are seen on a first-come first-served basis, and a limited number are seen during each clinic.   [facility]  [address] Hillard Danker [place], [place] [phone]   Eligibility Requirements You must have lived in [place], [place], or [place] counties for at least the last three months.   You cannot be eligible for state or federal sponsored [employer], including [employer], [place], or [employer].   You generally cannot be eligible for healthcare insurance through your employer.    How to apply: Eligibility screenings are held every Tuesday and Wednesday afternoon from 1:00 pm until 4:00 pm. You do not need an appointment for the interview!  [facility] [address] [place], [place]   [place]  [place] Department  [place]  [phone]    Behavioral Health Resources in the Community: Intensive Outpatient Programs Organization         Address  Phone  Notes  [place]. [address], [place], [place] [phone]   [facility] Outpatient [address], [place], [place]   ADS: Alcohol & Drug Svcs [address] [place], [place]   [place] [address]  [place], [place] or [phone]   Substance Abuse Resources Organization         Address  Phone  Notes  Alcohol and Drug Services  [phone]   [place]  [phone]   [facility] [place]   Chinita Pester  [phone]   Residential & Outpatient Substance Abuse Program  [phone]   Psychological Services Organization         Address  Phone  Notes  [facility] [place]  [place]  [phone]   [place] [address], [place] or [phone]    Mobile Crisis Teams Organization         Address  Phone  Notes  [facility], Mobile Crisis Care Unit  [phone]   [facility]ive Psychotherapeutic Services  [address] [place], [place]   [place] [address] [place] [address], [place] [phone]    Self-Help/Support Groups Organization         Address  Phone             Notes  [place]. of [place] - variety of support groups  [place] Call for more information  Narcotics Anonymous (NA), [facility] [address], [employer] [place]  2 meetings at this location   Special educational needs teacher         Address  Phone  Notes  [facility] Residential Treatment [place],    [place]  [phone]   [facility]  [address] [place] [address], [place], [place]   [place], [place] [phone] Admissions: 8am-3pm M-F  Incentives Substance [place] [address],    [place], [place] [phone]   [facility] [place] #  [place]uzanne [place], [place]   [facility] [address],  [place], [place]   [facility] - Intensive Outpatient [address] Krist[address], [place], [place]   [facility] ([place]  [place], [place] [phone] or [phone]   [facility] ([facility]S) [address], [place], [place] Accepts Medicaid  [facility] [place] [address],  [place] [phone] Substance Abuse/Addiction Treatment   [facility] Organization         Address  Phone  Notes  [facility]  [phone]   Domenic Schwab, PhD [address] Arlis Porta [place], [place]   [phone] or [phone]   [place], [place] [phone]   [place] [address], [place], [place] [phone] Insurance/Medicaid/sponsorship through [facility] and Families [address] [place]                                    [place], [place] [phone] [place] [address], [place] [phone]    Dr. Adele Schilder  [phone]   [facility] Dept. 1) [address] [place] [place] 2) [place] 3)  [place] [address], [place] [phone]  [phone]   [place] [phone] or [phone] (After Hours)

## 2013-10-27 NOTE — ED Provider Notes (Signed)
CSN: [id number]     Arrival date & time 10/27/13  1811 History   First MD Initiated Contact with Patient 10/27/13 [place]     Chief Complaint  Patient presents with  . Spinal Headache   . Hypertension     (Consider location/radiation/quality/duration/timing/severity/associated sxs/prior Treatment) HPI 38 year old female with history of pseudotumor cerebri who presents for evaluation of headache. Patient has undergone a therapeutic lumbar puncture 3 days ago which was done by IR.  Since this procedure 3 days ago she endorses a headache along with nausea and vomiting has been progressively worse. She described the headache as "A spike going through my skull and spreading throughout".  Headache is worse when she sits up that improves when she lies still. She has vomited a total of 4 times, nonbloody nonbilious vomitus. Pt has been taking Percocet, aleve and motrin at home without relief.  Endorse occasional chills without fever. She denies any new vision changes, neck stiffness, chest pain, shortness of breath, new numbness or weakness. She has consult with her doctor's office who recommend patient to come to ER for a blood patch. Patient also admits that she has history of high blood pressure and noticed that her blood pressure was high today in which she correlated with current pain. She did take her blood pressure medication this morning. 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  Otherwise, no other complaint.     Past Medical History  Diagnosis Date  . Breast cancer 2010  . Asthma   . Hypertension   . GERD (gastroesophageal reflux disease)   . Impaired glucose tolerance 04/13/2011  . Anemia, unspecified 04/13/2011  . Asthma 09/27/2010  . Bloating 03/18/2011  . 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 Lymphadenitis, chronic     restricted LEFT extremity   Past Surgical History  Procedure Laterality Date  . Hand surgery  2012    right; tendon  release  . Breast lumpectomy  09/2008    left  . Carpal tunnel release Left 2011  . Tubal ligation  05/11/2011    Procedure: POST PARTUM TUBAL LIGATION;  Surgeon: Emeterio Reeve, MD;  Location: [facility];  Service: Gynecology;  Laterality: Bilateral;  Induced for HTN  . Lymph node dissection  2010    left breast; 2 wks after breast lumpectomy   Family History  Problem Relation Age of Onset  . Diabetes Maternal Grandmother   . Heart disease Maternal Grandmother   . Hypertension Maternal Grandmother   . Cancer Neg Hx   . Alcohol abuse Neg Hx   . Early death Neg Hx   . Hyperlipidemia Neg Hx   . Kidney disease Neg Hx   . Stroke Neg Hx   . Colon cancer Neg Hx    History  Substance Use Topics  . Smoking status: Never Smoker   . Smokeless tobacco: Never Used  . Alcohol Use: 0.0 oz/week     Comment: occasionally   OB History   Grav Para Term Preterm Abortions TAB SAB Ect Mult Living   5 4 4  1  1   4      Review of Systems  Constitutional: Negative for fever.  Skin: Negative for rash.  Neurological: Positive for headaches. Negative for facial asymmetry, weakness and numbness.      Allergies  Aspirin and Penicillins  Home Medications   Prior to Admission medications   Medication Sig Start Date End Date Taking? Authorizing Provider  acetaZOLAMIDE (DIAMOX)  250 MG tablet Take 2 tablets (500 mg total) by mouth 2 (two) times daily. 02/05/13   Saddie Benders. Ghim, MD  albuterol (PROVENTIL HFA;VENTOLIN HFA) 108 (90 BASE) MCG/ACT inhaler Inhale 2 puffs into the lungs every 4 (four) hours as needed for wheezing or shortness of breath (((PLAN A))).     Historical Provider, MD  albuterol (PROVENTIL) (2.5 MG/3ML) 0.083% nebulizer solution Take 3 mLs (2.5 mg total) by nebulization every 4 (four) hours as needed for wheezing or shortness of breath (((PLAN B))). 04/06/13   Tammy S Parrett, NP  amLODipine-olmesartan (AZOR) 10-40 MG per tablet Take 1 tablet by mouth daily. 06/23/13   [name], MD   Biotin 1000 MCG tablet Take 1,000 mcg by mouth daily.    Historical Provider, MD  cloNIDine (CATAPRES) 0.3 MG tablet Take 1 tablet (0.3 mg total) by mouth 2 (two) times daily. 08/24/13   [name], MD  dextromethorphan-guaiFENesin [facility] DM) 30-600 MG per 12 hr tablet Take 1 tablet by mouth every 12 (twelve) hours as needed for cough.    Historical Provider, MD  HYDROcodone-acetaminophen (NORCO/VICODIN) 5-325 MG per tablet Take 1 tablet by mouth every 6 (six) hours as needed for moderate pain. 09/15/13   Osvaldo Shipper, MD  hyoscyamine (LEVSIN SL) 0.125 MG SL tablet Place 1 tablet (0.125 mg total) under the tongue every 8 (eight) hours as needed for cramping. 09/02/13   Jerene Bears, MD  isosorbide mononitrate (IMDUR) 30 MG 24 hr tablet Take 1 tablet (30 mg total) by mouth daily. 09/27/13   [name], MD  LORazepam (ATIVAN) 0.5 MG tablet Take 0.5 mg by mouth every 8 (eight) hours as needed for anxiety. 01/07/13   Chauncey Cruel, MD  metoCLOPramide (REGLAN) 10 MG tablet Take 1 tablet (10 mg total) by mouth 4 (four) times daily -  with meals and at bedtime. 01/26/13   Jerene Bears, MD  mometasone (NASONEX) 50 MCG/ACT nasal spray Place 2 sprays into the nose daily. 07/27/13   Julianne Rice, MD  mometasone-formoterol [facility]) 200-5 MCG/ACT AERO Inhale 2 puffs into the lungs 2 (two) times daily. 04/06/13   Tammy S Parrett, NP  montelukast (SINGULAIR) 10 MG tablet Take 1 tablet (10 mg total) by mouth at bedtime. 07/14/13   Tanda Rockers, MD  nitroGLYCERIN (NITROSTAT) 0.4 MG SL tablet Place 1 tablet (0.4 mg total) under the tongue every 5 (five) minutes as needed for chest pain. 08/24/13   [name], MD  ondansetron (ZOFRAN) 4 MG tablet Take 1 tablet (4 mg total) by mouth every 6 (six) hours. 10/22/13   Tanna Furry, MD  ondansetron (ZOFRAN-ODT) 4 MG disintegrating tablet Take 4 mg by mouth every 6 (six) hours as needed for nausea or vomiting. 09/15/13   Osvaldo Shipper, MD   oxyCODONE-acetaminophen (PERCOCET/ROXICET) 5-325 MG per tablet Take 2 tablets by mouth every 4 (four) hours as needed. 10/22/13   Tanna Furry, MD  pantoprazole (PROTONIX) 40 MG tablet Take 1 tablet (40 mg total) by mouth 2 (two) times daily before a meal. 09/02/13   Jerene Bears, MD  potassium chloride SA (K-DUR,KLOR-CON) 20 MEQ tablet Take 20 mEq by mouth 2 (two) times daily. 06/04/12   [name], MD  pregabalin (LYRICA) 100 MG capsule Take 1 capsule (100 mg total) by mouth 2 (two) times daily. 08/24/13   Donika Keith Rake, DO  sucralfate (CARAFATE) 1 G tablet Take 1 tablet (1 g total) by mouth 4 (four)  times daily. 03/07/12   [name], MD  triamterene-hydrochlorothiazide (MAXZIDE-25) 37.5-25 MG per tablet Take 1 tablet by mouth every morning.    Historical Provider, MD   BP 201/136  Pulse 97  Temp(Src) 98.6 F (37 C) (Oral)  Resp 18  SpO2 100% Physical Exam  Nursing note and vitals reviewed. Constitutional: She is oriented to person, place, and time. She appears well-developed and well-nourished. No distress.  HENT:  Head: Atraumatic.  Eyes: Conjunctivae and EOM are normal. Pupils are equal, round, and reactive to light.  Neck: Normal range of motion. Neck supple.  No nuchal rigidity   Cardiovascular: Normal rate and regular rhythm.   Pulmonary/Chest: Effort normal and breath sounds normal.  Abdominal: Soft. There is no tenderness.  Lymphadenopathy:    She has no cervical adenopathy.  Neurological: She is alert and oriented to person, place, and time. She has normal strength. No cranial nerve deficit or sensory deficit. Gait (not tested) normal. GCS eye subscore is 4. GCS verbal subscore is 5. GCS motor subscore is 6.  Skin: No rash noted.  Normal spine, no overlying skin changes   Psychiatric: She has a normal mood and affect.    ED Course  Procedures (including critical care time)  7:01 PM Patient in with worsening headache after LP suspicious of post LP headache.  She is  hypertensive with hx of malignant hypertension.  Suspect HTN 2/2 to pain at this time.  No signs to suggest meningitis.  Pt mentating appropriately.    7:12 PM I have consulted with radiologist who agrees to help set up an outpt blood patch tomorrow morning.  I will also leave a message to the radiology department at [phone] to notify.    7:50 PM Patient report moderate improvement with pain medication given in the ER. She is made aware that radiology will call her tomorrow to schedule the procedure. I Have given her the number to call if she does not hear back from radiology.  Return precaution discussed.    Labs Review Labs Reviewed - No data to display  Imaging Review No results found.   EKG Interpretation None      MDM   Final diagnoses:  Post lumbar puncture headache    BP 201/136  Pulse 97  Temp(Src) 98.6 F (37 C) (Oral)  Resp 18  SpO2 100% Pt made aware BP is high and will need to have it recheck by PCP.      Domenic Moras, PA-C 10/27/13 1959

## 2013-10-27 NOTE — ED Provider Notes (Signed)
Medical screening examination/treatment/procedure(s) were performed by non-physician practitioner and as supervising physician I was immediately available for consultation/collaboration.    Johnna Acosta, MD 10/27/13 [phone]

## 2013-10-27 NOTE — ED Notes (Signed)
Pt tolerating PO fluids

## 2013-10-27 NOTE — ED Notes (Signed)
Pt states that she had a LP done on Monday.  Pt states that she has had a headache w/ NV since that has gotten worse.  Needs a blood patch.

## 2013-10-27 NOTE — ED Notes (Signed)
Unsccessful attempt at lab draw will notify phlebotomy

## 2013-10-28 ENCOUNTER — Telehealth: Payer: Self-pay | Admitting: Internal Medicine

## 2013-10-28 NOTE — Telephone Encounter (Signed)
Jeannie over at [facility] is calling in regards to the patient who was ordered to have a blood patch by the ED doctor who saw her at [facility] hospital last night. Edmonia Lynch states that their radiologist thinks that the patient may need to wait with her symptoms to have this done but they want to have a 2nd opinion from Dr. [name]. Edmonia Lynch would like a call back at [place] with an opinion from Dr. [name] on if he thinks it is okay for them to schedule her for a blood patch. Please advise.

## 2013-10-28 NOTE — Progress Notes (Addendum)
Spoke with Dr. Jobe Igo who performed LP on patient on 10/25/13.  He states he only withdrew CSF during LP for pseudotumor cerebri to reduce ICP; did not send any CSF for labs/cultures.  jkl

## 2013-10-29 NOTE — Progress Notes (Signed)
Received voice mail message this morning left last night at 1906 by Domenic Moras, PA-C (and Dr. Tanna Furry) with [facility] ED requesting epidural blood patch for patient who is s/p LP at [facility] this past Monday, October 25, 2013.  Per Dr. Marylyn Ishihara Talbot's dictation of the procedure, the patient's opening pressure was 26 cm H2O.  After 83ml CSF was drained, her closing pressure was 6cm H2O.  Apparently, patient has had diagnosis of pseudotumor cerebri and been on Diamox since an opthamologist (Dr. Luberta Mutter, [phone]) discovered it in June 2013.  Her office seems to be following her for the pseudotumor cerebri, seeing her approximately every six months.  Patient reports she sees Dr. [name] for her breast cancer, and "he sometimes writes me prescriptions for the Diamox."  Dr. Scarlette Calico with [facility] also in involved in her care, though patient states she doesn't see him often due to a strained relationship with him.  By the end of this day, after speaking with all of the above caretakers about the patient's spinal headache with a diagnosis of pseudotumor cerebri, both my radiologist (Dr. Toni Amend Hoss) and the ED physician who originally ordered the LP and the blood patch (Dr. Tanna Furry) agree the patient should continue with strict bedrest at least through the weekend before considering the blood patch to see if she might acclimate to her new, lower/normal ICP.  I spoke with patient several times throughout the day updating her on the status of things, and I explained the blood patch procedure and the possiblity it may not help her headache if the headache isn't caused by a spinal fluid leak but by the drastic and sudden change in her ICP.  She states an understanding of all of this and doesn't want the procedure done if it isn't going to help.  I told her I would call her on Monday, 11/01/13, to see how she was feeling and to reassess possibly doing the blood patch.  She was in agreement with  this plan but did ask for some pain and nausea medicine to get her through the weekend.  I suggested she get back in touch with Dr. Jeneen Rinks in [facility] ED (who is expecting her to contact him), as we don't prescribe medications from this office.  jkl

## 2013-10-29 NOTE — Telephone Encounter (Signed)
Called patient who says that her headache is slowly improving. It is worse when upright and improves when laying flat.  I have encouraged her to increase fluid intake and caffeine.  She is agreeable to this and will hold off on blood patch for now.  Erica K. Posey Pronto, DO

## 2013-10-29 NOTE — Telephone Encounter (Signed)
I will forward to neurology

## 2013-11-30 ENCOUNTER — Ambulatory Visit (INDEPENDENT_AMBULATORY_CARE_PROVIDER_SITE_OTHER): Payer: Medicaid - Out of State | Admitting: Cardiology

## 2013-11-30 ENCOUNTER — Encounter: Payer: Self-pay | Admitting: Cardiology

## 2013-11-30 VITALS — BP 160/100 | HR 107 | Ht 67.0 in | Wt 207.1 lb

## 2013-11-30 DIAGNOSIS — I509 Heart failure, unspecified: Secondary | ICD-10-CM

## 2013-11-30 MED ORDER — DILTIAZEM HCL ER COATED BEADS 240 MG PO CP24: 240.0000 mg | ORAL_CAPSULE | Freq: Every day | ORAL | Status: DC

## 2013-11-30 NOTE — Progress Notes (Signed)
Patient ID: KAJOL CRISPEN, female   DOB: May 13, 1975, 38 y.o.   MRN: [id number]    Patient Name: Erica [name] Date of Encounter: 11/30/2013  Primary Care Provider:  Scarlette Calico, MD Primary Cardiologist:  Ena Dawley H  Patient Profile  Chest pain  Problem List   Past Medical History  Diagnosis Date  . Breast cancer 2010  . Asthma   . Hypertension   . GERD (gastroesophageal reflux disease)   . Impaired glucose tolerance 04/13/2011  . Anemia, unspecified 04/13/2011  . Asthma 09/27/2010  . Bloating 03/18/2011  . 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 Lymphadenitis, chronic     restricted LEFT extremity   Past Surgical History  Procedure Laterality Date  . Hand surgery  2012    right; tendon release  . Breast lumpectomy  09/2008    left  . Carpal tunnel release Left 2011  . Tubal ligation  05/11/2011    Procedure: POST PARTUM TUBAL LIGATION;  Surgeon: Emeterio Reeve, MD;  Location: [facility];  Service: Gynecology;  Laterality: Bilateral;  Induced for HTN  . Lymph node dissection  2010    left breast; 2 wks after breast lumpectomy    Allergies  Allergies  Allergen Reactions  . Aspirin Shortness Of Breath and Palpitations    Pt can take ibuprofen without reaction  . Penicillins Shortness Of Breath and Palpitations    HPI  38 year old female with h/o asthma, obesity, hypertension, who is coming with complaints of chest pain. This pleasant young female has a history of locally advanced left breast carcinoma diagnosed in 2010 and treated with neoadjuvant chemotherapy with docetaxel and cyclophosphamide as well as paclitaxel. This was followed by radiation therapy which was completed in 2011. The patient complains of chest pain it is not exertional, it is locally located in the retrosternal and left epigastric region. It has been happening anytime of the day, and can last for minutes. There is no clear aggravating or alleviating factor. The patient states that ever since her  chemotherapy she gets short of breath with minimal activity and states that she will be able to walk a block.  Follow up after 4 month, no change in her symptoms, she still has exertional shortness of breath and fatigue. She also has a paroxysmal chest pain "heart squeezing" associated with SOB, alleviated by lorazepam and inhalors.   09/27/2013 - the patient is coming complaining of continuous daily wheezing, and nonexertional right upper quadrant pain that force her to go to the ER. A complete workup including abdominal ultrasound and CAT scan was performed. The only finding was fatty liver no acute pathology in the gallbladder no gallstones. The patient had an upper EGD last year where her gastroparesis was diagnosed and she was started on metoclopramide and proton next. Her symptoms improved however she continues having episodes of this pain. She feels overall very tired and short of breath and complaints of orthopnea.  11/30/2013 - we stopped Bystolic at the last visit as it caused wheezing, her wheezing has improved but she feels more SOB> She is trying to exercise but gets SOB easily.   Home Medications  Prior to Admission medications   Medication Sig Start Date End Date Taking? Authorizing Provider  albuterol (PROVENTIL HFA;VENTOLIN HFA) 108 (90 BASE) MCG/ACT inhaler Inhale 2 puffs into the lungs every 4 (four) hours as needed for wheezing or shortness of breath (((PLAN A))).     Historical Provider, MD  albuterol (PROVENTIL) (2.5 MG/3ML) 0.083%  nebulizer solution Take 2.5 mg by nebulization every 4 (four) hours as needed for wheezing or shortness of breath (((PLAN B))). For shortness of breath. 05/07/12   Amy Milda Smart, PA-C  amLODipine-olmesartan (AZOR) 5-40 MG per tablet Take 1 tablet by mouth daily. 08/05/12   [name], MD  cloNIDine (CATAPRES) 0.2 MG tablet Take 1 tablet (0.2 mg total) by mouth 2 (two) times daily. 08/05/12   [name], MD  hyoscyamine (LEVSIN SL) 0.125 MG SL tablet  Place 0.125 mg under the tongue every 8 (eight) hours as needed for cramping. 05/19/12   Jerene Bears, MD  LORazepam (ATIVAN) 0.5 MG tablet Take 1 tablet (0.5 mg total) by mouth at bedtime as needed for anxiety. 01/07/13   Chauncey Cruel, MD  metoCLOPramide (REGLAN) 10 MG tablet Take 1 tablet (10 mg total) by mouth 4 (four) times daily -  with meals and at bedtime. 01/14/13   Jerene Bears, MD  mometasone-formoterol (DULERA) 100-5 MCG/ACT AERO Inhale 2 puffs into the lungs 2 (two) times daily. 05/07/12   Amy Milda Smart, PA-C  montelukast (SINGULAIR) 10 MG tablet Take 1 tablet (10 mg total) by mouth at bedtime. 12/31/11   Chauncey Cruel, MD  nebivolol (BYSTOLIC) 10 MG tablet Take 10 mg by mouth at bedtime.    Historical Provider, MD  pantoprazole (PROTONIX) 40 MG tablet Take 1 tablet (40 mg total) by mouth daily. 01/07/13   Chauncey Cruel, MD  potassium chloride SA (K-DUR,KLOR-CON) 20 MEQ tablet Take 20 mEq by mouth 2 (two) times daily. 06/04/12   [name], MD  sucralfate (CARAFATE) 1 G tablet Take 1 tablet (1 g total) by mouth 4 (four) times daily. 03/07/12   [name], MD  traMADol Veatrice Bourbon) 50 MG tablet 1-2 tabs by mouth every 4 hours as needed for cough or pain 09/10/12   Amy Milda Smart, PA-C  triamterene-hydrochlorothiazide (MAXZIDE-25) 37.5-25 MG per tablet Take 1 tablet by mouth every morning.    Historical Provider, MD    Family History  Family History  Problem Relation Age of Onset  . Diabetes Maternal Grandmother   . Heart disease Maternal Grandmother   . Hypertension Maternal Grandmother   . Cancer Neg Hx   . Alcohol abuse Neg Hx   . Early death Neg Hx   . Hyperlipidemia Neg Hx   . Kidney disease Neg Hx   . Stroke Neg Hx   . Colon cancer Neg Hx     Social History  History   Social History  . Marital Status: Single    Spouse Name: N/A    Number of Children: 3  . Years of Education: N/A   Occupational History  .     Social History Main Topics  . Smoking status:  Never Smoker   . Smokeless tobacco: Never Used  . Alcohol Use: 0.0 oz/week     Comment: occasionally  . Drug Use: No  . Sexual Activity: Yes    Birth Control/ Protection: Surgical   Other Topics Concern  . Not on file   Social History Narrative   She lives with four children.   She is currently not working (disbaility pending).           Review of Systems General:  No chills, fever, night sweats or weight changes.  Cardiovascular:  No chest pain, dyspnea on exertion, edema, orthopnea, palpitations, paroxysmal nocturnal dyspnea. Dermatological: No rash, lesions/masses Respiratory: No cough, dyspnea Urologic: No hematuria, dysuria  Abdominal:   No nausea, vomiting, diarrhea, bright red blood per rectum, melena, or hematemesis Neurologic:  No visual changes, wkns, changes in mental status. All other systems reviewed and are otherwise negative except as noted above.  Physical Exam Blood pressure 160/100, pulse 107, height 5\' 7"  (1.702 m), weight 207 lb 1.9 oz (93.949 kg).  General: Pleasant, NAD Psych: Normal affect. Neuro: Alert and oriented X 3. Moves all extremities spontaneously. HEENT: Normal  Neck: Supple without bruits or JVD. Lungs:  Resp regular and unlabored, CTA. Heart: RRR no s3, s4, or murmurs. Abdomen: Soft, non-tender, non-distended, BS + x 4.  Extremities: No clubbing, cyanosis or edema. DP/PT/Radials 2+ and equal bilaterally.  Lipid Panel     Component Value Date/Time   CHOL 196 02/16/2013 1243   TRIG 97.0 02/16/2013 1243   HDL 47.30 02/16/2013 1243   CHOLHDL 4 02/16/2013 1243   VLDL 19.4 02/16/2013 1243   LDLCALC 129* 02/16/2013 1243   Nuclear stress test: 02/15/2013 Impression  Exercise Capacity: Lexiscan with no exercise.  BP Response: Hypertensive blood pressure response.  Clinical Symptoms: Chest pain, headache.  ECG Impression: No significant ST segment change suggestive of ischemia.  Comparison with Prior Nuclear Study: No images to compare    Overall Impression: Normal stress nuclear study.  LV Ejection Fraction: 54%. LV Wall Motion: NL LV Function; NL Wall Motion  Loralie Champagne  02/12/2013   TTE 06/17/2013  Left ventricle: The cavity size was normal. There was mild concentric hypertrophy. Systolic function was normal. The estimated ejection fraction was in the range of 60% to 65%. Wall motion was normal; there were no regional wall motion abnormalities. Features are consistent with a pseudonormal left ventricular filling pattern, with concomitant abnormal relaxation and increased filling pressure (grade 2 diastolic dysfunction). Doppler parameters are consistent with high ventricular filling pressure. Impressions: - When compared to 2010, EF has improved.  Accessory Clinical Findings  ECG - normal sinus rhythm, 100 beats per minute, normal EKG.    Assessment & Plan  38 year old female with prior medical history of hyperlipidemia, hypertension and breast carcinoma with chemotherapy that can potentially cause heart block, heart failure and angina.  1. DOE - we will discontinued Bystolic (wheezing), continue Clonidine to 0.3 mg po BID, add PRN sl NTG, continue Imdur 30 mg po daily. Holter monitor for 48 hours showed no arrhythmias or bradycardias.. Encouraged to exercise daily.  2. Chronic diastolic CHF, associated with dyspnea on exertion - A lexiscan nuclear stress test showed poor exercise capacity, LV EF 54% and no perfusion defect.  Echocardiogram showed preserved LV function it is improved compared to prior echo however she has pseudo-normal pattern of diastolic dysfunction with elevated filling pressures. We'll need to be more aggressive on blood pressure management.  She is till hypertensive and tachycardic, we will add cardizem 240 CD daily despite use of amlodipine as we need AVN blocking agent and BB give her significant wheezing.  We will add Imdur 30 mg daily and possibly add hydralazine in the future if  necessary.  3. HTN - fias above  4. Lipid profile - LDL 129 just recommend exercise and diet.  5. 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

## 2013-11-30 NOTE — Patient Instructions (Signed)
Your physician has recommended you make the following change in your medication:   START TAKING CARDIZEM CD 240 MG DAILY  DR NELSON HAS REQUESTED FOR YOU TO COME IN FOR A NURSE VISIT TO RECHECK YOUR BP IN 2 WEEKS  You have been referred to [place] physician recommends that you schedule a follow-up appointment in: [place]

## 2013-12-20 ENCOUNTER — Telehealth: Payer: Self-pay | Admitting: *Deleted

## 2013-12-20 NOTE — Telephone Encounter (Signed)
Faxed cardiac rehab papers to cardiac rehab at [phone] phase 1 and 2 papers.

## 2013-12-23 ENCOUNTER — Ambulatory Visit (INDEPENDENT_AMBULATORY_CARE_PROVIDER_SITE_OTHER): Payer: Medicaid - Out of State | Admitting: Cardiology

## 2013-12-23 VITALS — BP 154/94 | HR 99 | Resp 20 | Wt 206.1 lb

## 2013-12-23 DIAGNOSIS — I10 Essential (primary) hypertension: Secondary | ICD-10-CM

## 2013-12-23 MED ORDER — AMLODIPINE-OLMESARTAN 5-40 MG PO TABS: 2.0000 | ORAL_TABLET | Freq: Every day | ORAL | Status: DC

## 2013-12-23 NOTE — Progress Notes (Signed)
Pt st she is a little short of breath, but she blames her allergies and asthma.  No other complaints.  BP checked, taken to Dr. Meda Coffee for review. Dr. Meda Coffee increased Azor to 10-80 daily.  Informed patient of medication change. Patient to be seen by Dr. Meda Coffee in December.

## 2013-12-23 NOTE — Patient Instructions (Addendum)
Your physician has recommended you make the following change in your medication:  1) INCREASE AZOR to 10-80.  Your next office visit is with Dr. Meda Coffee 12/14.

## 2013-12-30 ENCOUNTER — Encounter (HOSPITAL_COMMUNITY)
Admission: RE | Admit: 2013-12-30 | Discharge: 2013-12-30 | Disposition: A | Discharge status: Home / Self Care | Payer: Medicaid - Out of State | Source: Ambulatory Visit | Attending: Cardiology | Admitting: Cardiology

## 2013-12-30 DIAGNOSIS — I5030 Unspecified diastolic (congestive) heart failure: Secondary | ICD-10-CM | POA: Insufficient documentation

## 2013-12-30 DIAGNOSIS — Z5189 Encounter for other specified aftercare: Secondary | ICD-10-CM | POA: Insufficient documentation

## 2013-12-30 DIAGNOSIS — I429 Cardiomyopathy, unspecified: Secondary | ICD-10-CM | POA: Insufficient documentation

## 2013-12-30 NOTE — Progress Notes (Signed)
Cardiac Rehab Medication Review by a Pharmacist  Does the patient  feel that his/her medications are working for him/her?  some   Has the patient been experiencing any side effects to the medications prescribed?  no  Does the patient measure his/her own blood pressure or blood glucose at home?  yes -- checks BP at home. Systolic ranging 166A-630Z.  Does the patient have any problems obtaining medications due to transportation or finances?   no  Understanding of regimen: good Understanding of indications: good Potential of compliance: good  Pharmacist comments: Patient is a 38 y/o female who presents to cardiac rehab today for review of medications. She is adherent to regimen, although she states that the Vicodin and Percocet do not help with her leg and hand pain. She has also had an ongoing productive cough for a year. She currently uses Dulera, Singulair, albuterol inhaler TID, albuterol nebs BID, and Mucinex. She is being followed by a pulmonologist and has an appt scheduled next month. Patient reports no side effects with current medications.  Alayne Estrella E. Kerry Odonohue, Pharm.D Clinical Pharmacy Resident Pager: [phone] 12/30/2013 9:14 AM

## 2014-01-03 ENCOUNTER — Encounter (HOSPITAL_COMMUNITY): Payer: Medicaid - Out of State

## 2014-01-03 ENCOUNTER — Telehealth (HOSPITAL_COMMUNITY): Payer: Self-pay | Admitting: Internal Medicine

## 2014-01-05 ENCOUNTER — Encounter (HOSPITAL_COMMUNITY): Payer: Self-pay

## 2014-01-05 ENCOUNTER — Encounter (HOSPITAL_COMMUNITY)
Admission: RE | Admit: 2014-01-05 | Discharge: 2014-01-05 | Disposition: A | Discharge status: Home / Self Care | Payer: Medicaid - Out of State | Source: Ambulatory Visit | Attending: Cardiology | Admitting: Cardiology

## 2014-01-05 DIAGNOSIS — Z5189 Encounter for other specified aftercare: Secondary | ICD-10-CM | POA: Diagnosis not present

## 2014-01-05 DIAGNOSIS — I429 Cardiomyopathy, unspecified: Secondary | ICD-10-CM | POA: Diagnosis not present

## 2014-01-05 DIAGNOSIS — I5030 Unspecified diastolic (congestive) heart failure: Secondary | ICD-10-CM | POA: Diagnosis not present

## 2014-01-05 NOTE — Progress Notes (Signed)
Pt started cardiac rehab today.  Pt tolerated light exercise without difficulty.  VSS,telemetry-Sinus rhythm.  Asymptomatic.  PHQ-16.  Pt reports chronic depressive symptoms related to her current health status and physical inability to perform tasks as prior to her illness.  Pt is currently being treated with xanax which is ineffective for her symptoms.  PCP, oncologist and Dr. Meda Coffee will be made aware of PHQ scores and quality of life scores with plan to arrange for counseling and possible prescription for antidepressant.  Pt would like to think about counseling and let me know when she is ready to  discuss scheduling at future rehab visit.   Pt is frequently short of breath with fatigue and occasional chest pain.   Pt has discussed these symptoms with cardiology. Pt cardiac rehab goals are to decrease dyspnea and restore energy so she can enjoy time with her children, ages 25,17,6 and 2.  Pt oriented to exercise equipment and routine.  Understanding verbalized.

## 2014-01-07 ENCOUNTER — Encounter (HOSPITAL_COMMUNITY)
Admission: RE | Admit: 2014-01-07 | Discharge: 2014-01-07 | Disposition: A | Discharge status: Home / Self Care | Payer: Medicaid - Out of State | Source: Ambulatory Visit | Attending: Cardiology | Admitting: Cardiology

## 2014-01-07 DIAGNOSIS — Z5189 Encounter for other specified aftercare: Secondary | ICD-10-CM | POA: Diagnosis not present

## 2014-01-10 ENCOUNTER — Encounter (HOSPITAL_COMMUNITY)
Admission: RE | Admit: 2014-01-10 | Discharge: 2014-01-10 | Disposition: A | Discharge status: Home / Self Care | Payer: Medicaid - Out of State | Source: Ambulatory Visit | Attending: Cardiology | Admitting: Cardiology

## 2014-01-10 DIAGNOSIS — Z5189 Encounter for other specified aftercare: Secondary | ICD-10-CM | POA: Diagnosis not present

## 2014-01-12 ENCOUNTER — Encounter (HOSPITAL_COMMUNITY)
Admission: RE | Admit: 2014-01-12 | Discharge: 2014-01-12 | Disposition: A | Discharge status: Home / Self Care | Payer: Medicaid - Out of State | Source: Ambulatory Visit | Attending: Cardiology | Admitting: Cardiology

## 2014-01-12 DIAGNOSIS — Z5189 Encounter for other specified aftercare: Secondary | ICD-10-CM | POA: Diagnosis not present

## 2014-01-14 ENCOUNTER — Encounter (HOSPITAL_COMMUNITY)
Admission: RE | Admit: 2014-01-14 | Discharge: 2014-01-14 | Disposition: A | Discharge status: Home / Self Care | Payer: Medicaid - Out of State | Source: Ambulatory Visit | Attending: Cardiology | Admitting: Cardiology

## 2014-01-14 DIAGNOSIS — Z5189 Encounter for other specified aftercare: Secondary | ICD-10-CM | POA: Diagnosis not present

## 2014-01-17 ENCOUNTER — Encounter (HOSPITAL_COMMUNITY)
Admission: RE | Admit: 2014-01-17 | Discharge: 2014-01-17 | Disposition: A | Discharge status: Home / Self Care | Payer: Medicaid - Out of State | Source: Ambulatory Visit | Attending: Cardiology | Admitting: Cardiology

## 2014-01-17 DIAGNOSIS — Z5189 Encounter for other specified aftercare: Secondary | ICD-10-CM | POA: Diagnosis not present

## 2014-01-17 NOTE — Progress Notes (Signed)
I have reviewed home exercise with Wednesday. The patient was advised to walk 2-4 days per week outside of CRP [place] for 15 minutes, 2 times per day until she can walk 30 minutes continuously.  Pt will also complete one additional day of hand weights outside of CRP II.  Progression of exercise prescription was discussed.  Reviewed THR, pulse, RPE, sign and symptoms, NTG use and when to call 911 or MD.  Pt voiced understanding. [place], MS, ACSM RCEP 01/17/2014 2:15 PM

## 2014-01-18 ENCOUNTER — Encounter: Payer: Self-pay | Admitting: Neurology

## 2014-01-18 ENCOUNTER — Ambulatory Visit (INDEPENDENT_AMBULATORY_CARE_PROVIDER_SITE_OTHER): Payer: Self-pay | Admitting: Neurology

## 2014-01-18 VITALS — BP 134/70 | HR 91 | Ht 66.0 in | Wt 205.3 lb

## 2014-01-18 DIAGNOSIS — G620 Drug-induced polyneuropathy: Secondary | ICD-10-CM

## 2014-01-18 DIAGNOSIS — T451X5A Adverse effect of antineoplastic and immunosuppressive drugs, initial encounter: Secondary | ICD-10-CM

## 2014-01-18 DIAGNOSIS — G932 Benign intracranial hypertension: Secondary | ICD-10-CM

## 2014-01-18 DIAGNOSIS — Z79899 Other long term (current) drug therapy: Secondary | ICD-10-CM

## 2014-01-18 MED ORDER — ACETAZOLAMIDE 250 MG PO TABS: ORAL_TABLET | ORAL | Status: DC

## 2014-01-18 MED ORDER — PREGABALIN 200 MG PO CAPS: 200.0000 mg | ORAL_CAPSULE | Freq: Three times a day (TID) | ORAL | Status: DC

## 2014-01-18 MED ORDER — LIDOCAINE 5 % EX OINT: TOPICAL_OINTMENT | CUTANEOUS | Status: DC

## 2014-01-18 NOTE — Progress Notes (Signed)
Follow-up Visit   Date: 01/18/2014    Erica [name] MRN: [id number] DOB: July 11, 1975   Erica [name]: Erica [name] is a 38 y.o. 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    [name] of present illness: Starting in 2010 after her second course of chemotherapy she developed numbness/tingling of the arm and legs, described as her hands are asleep. She has burning sensation and shaking of her hands. Activity worsens pain and she has difficulty with fine motor tasks such as buttoning, tying shoe laces, and braiding her daughter's hair. She falls about once per month because her legs give out. She was started on Lyrica 50mg  TID around March which has helped slightly.   Of note, she [name] of pseudotumor cerebri, followed by ophthalmology, and currently takes diamox 500mg  BID. 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 She had lumbar puncture performed in the seated position with documented OP of 43 and 32cc of fluid removed. She had a second lumbar puncture in 2014 at which time OP was 18 mmHg.  - Follow-up 10/15/2013:  No significant difference in pain since increasing Lyrica to 100mg  BID.  Overall, symptoms remain unchanged and she continues to have numbness/tingling of the hands and feet.   UPDATE 01/18/2014:  She had one interval emergency room visit due to severe headache and had LP which showed OP of 26.  Since then, headache has been under control.  No new vision complaints.  Her leg pain continues to be problematic with no lasting benefit despite increase Lyrica to 200mg  BID.  She has no  medication side effects.     Medications:  Current Outpatient Prescriptions on File Prior to Visit  Medication Sig Dispense Refill  . albuterol (PROVENTIL HFA;VENTOLIN HFA) 108 (90 BASE) MCG/ACT inhaler Inhale 2 puffs into the lungs every 4 (four) hours as needed for wheezing or shortness of breath (((PLAN A))).       Marland Kitchen albuterol (PROVENTIL) (2.5 MG/3ML) 0.083% nebulizer solution Take 3 mLs (2.5 mg total) by nebulization every 4 (four) hours as needed for wheezing or shortness of breath (((PLAN B))).  300 mL  5  . amLODipine-olmesartan (AZOR) 5-40 MG per tablet Take 2 tablets by mouth daily.  30 tablet  3  . Biotin 1000 MCG tablet Take 1,000 mcg by mouth daily.      . cloNIDine (CATAPRES) 0.3 MG tablet Take 1 tablet (0.3 mg total) by mouth 2 (two) times daily.  90 tablet  3  . dextromethorphan-guaiFENesin (MUCINEX DM) 30-600 MG per 12 hr tablet Take 1 tablet by mouth every 12 (twelve) hours as needed for cough.      . diltiazem (CARDIZEM CD) 240 MG 24 hr capsule Take 1 capsule (240 mg total) by mouth daily.  90 capsule  4  . HYDROcodone-acetaminophen (NORCO/VICODIN) 5-325 MG per tablet Take 1 tablet by mouth every 6 (six) hours as needed for moderate pain.  15 tablet  0  . hyoscyamine (LEVSIN SL) 0.125 MG SL tablet Place 1 tablet (0.125 mg total) under the tongue every 8 (eight) hours as needed for cramping.  30 tablet  3  . isosorbide mononitrate (IMDUR) 30 MG 24 hr tablet Take 1 tablet (30 mg total) by  mouth daily.  90 tablet  3  . LORazepam (ATIVAN) 0.5 MG tablet Take 0.5 mg by mouth every 8 (eight) hours as needed for anxiety.      . metoCLOPramide (REGLAN) 10 MG tablet Take 1 tablet (10 mg total) by mouth 4 (four) times daily -  with meals and at bedtime.  120 tablet  3  . mometasone (NASONEX) 50 MCG/ACT nasal spray Place 2 sprays into the nose daily.  17 g  12  . mometasone-formoterol (DULERA) 200-5 MCG/ACT AERO Inhale 2 puffs into the lungs 2 (two) times daily.  13 g  5  . montelukast  (SINGULAIR) 10 MG tablet Take 1 tablet (10 mg total) by mouth at bedtime.  30 tablet  5  . nebivolol (BYSTOLIC) 5 MG tablet Take 5 mg by mouth daily.      . nitroGLYCERIN (NITROSTAT) 0.4 MG SL tablet Place 1 tablet (0.4 mg total) under the tongue every 5 (five) minutes as needed for chest pain.  90 tablet  3  . ondansetron (ZOFRAN) 4 MG tablet Take 1 tablet (4 mg total) by mouth every 6 (six) hours.  12 tablet  0  . ondansetron (ZOFRAN-ODT) 4 MG disintegrating tablet Take 1 tablet (4 mg total) by mouth every 6 (six) hours as needed for nausea or vomiting.  20 tablet  0  . oxyCODONE-acetaminophen (PERCOCET/ROXICET) 5-325 MG per tablet Take 2 tablets by mouth every 8 (eight) hours as needed for severe pain.  6 tablet  0  . pantoprazole (PROTONIX) 40 MG tablet Take 1 tablet (40 mg total) by mouth 2 (two) times daily before a meal.  60 tablet  3  . potassium chloride SA (K-DUR,KLOR-CON) 20 MEQ tablet Take 20 mEq by mouth 2 (two) times daily.      . sucralfate (CARAFATE) 1 G tablet Take 1 tablet (1 g total) by mouth 4 (four) times daily.  30 tablet  0  . traMADol (ULTRAM) 50 MG tablet Take 50 mg by mouth every 4 (four) hours as needed (1-2 tabs as needed for cough or pain).      . triamterene-hydrochlorothiazide (MAXZIDE-25) 37.5-25 MG per tablet Take 1 tablet by mouth every morning.       No current facility-administered medications on file prior to visit.    Allergies:  Allergies  Allergen Reactions  . Aspirin Shortness Of Breath and Palpitations    Pt can take ibuprofen without reaction  . Penicillins Shortness Of Breath and Palpitations     Review of Systems:  CONSTITUTIONAL: No fevers, chills, night sweats, or weight loss.   EYES: No visual changes or eye pain ENT: No hearing changes.  No [name] of nose bleeds.   RESPIRATORY: No cough, wheezing and shortness of breath.   CARDIOVASCULAR: Negative for chest pain, and palpitations.   GI: Negative for abdominal discomfort, blood in stools  or black stools.  No recent change in bowel habits.   GU:  No [name] of incontinence.   MUSCLOSKELETAL: No [name] of joint pain or swelling.  No myalgias.   SKIN: Negative for lesions, rash, and itching.   ENDOCRINE: Negative for cold or heat intolerance, polydipsia or goiter.   PSYCH:  No depression or anxiety symptoms.   NEURO: As Above.   Vital Signs:  BP 134/70  Pulse 91  Ht 5\' 6"  (1.676 m)  Wt 205 lb 5 oz (93.129 kg)  BMI 33.15 kg/m2  SpO2 97%  Neurological Exam: MENTAL STATUS including orientation to time, place, person, recent and  remote memory, attention span and concentration, language, and fund of knowledge is normal.  Speech is not dysarthric.  CRANIAL NERVES:  No papilledema on fundoscopic exam, disc appears sharp bilaterally.  No visual field defects. Pupils equal round and reactive to light.  Normal conjugate, extra-ocular eye movements in all directions of gaze.  No ptosis.  MOTOR:  Motor strength is 5/5 in all extremities.  MSRs:  Reflexes are 2+/4 throughout.  SENSORY:  Intact to vibration throughout, mild asymmetric with 20% reduction at right knee.  COORDINATION/GAIT:   Gait narrow based and stable.   Data: Lab Results  Component Value Date   TSH 0.90 07/14/2013   Lab Results  Component Value Date   VITAMINB12 605 10/15/2013  [place] 124  CSF 09/18/2011: R269 W0 G65 P17, CSF cytology-neg OP 43, 32cc removed   LP 02/05/2013: OP 18, CP 17 after 21 cc removed  LP 10/25/2013:  OP 26, CP 5 after 15 cc removed  Labs 09/17/2011: vitamin B12 725, RPR NR   CT head 02/05/2013: No acute abnormality   MRI brain 08/13/2010:  1. No evidence for metastatic disease to the brain.  2. Scattered subcortical T2 and FLAIR hyperintensities are slightly greater than expected for age. The finding is nonspecific  but can be seen in the setting of chronic microvascular ischemia, a demyelinating process such as multiple sclerosis, vasculitis, complicated migraine headaches, or as  the sequelae of a prior infectious or inflammatory process. The lesions could be related to prior brain radiation if this has been performed.    IMPRESSION/PLAN: 1.  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   - Clinically slightly worse with no lasting improvement of pain  - Increase Lyrica to 200mg  TID  - Start lidocaine ointment to legs BID PRN  - If there is no improvement, consider TCAs, gabapentin, SNRI.     2.  Pseudotumor cerebri  - Clinically stable with no papilledema, but had an interval LP at the emergency department in July with OP 26 > CP 6  - Titrate diamox to 500mg  in the morning, 250mg  in the afternoon and 500mg  at bedtime  - Encouraged to eat potassium rich foods  3.  Return to clinic in 3-6 months    The duration of this appointment visit was 25 minutes of face-to-face time with the patient.  Greater than 50% of this time was spent in counseling, explanation of diagnosis, planning of further management, and coordination of care.   Thank you for allowing me to participate in patient's care.  If I can answer any additional questions, I would be pleased to do so.    Sincerely,    Saara Kijowski K. Posey Pronto, DO

## 2014-01-18 NOTE — Patient Instructions (Signed)
1.  Increase Lyrica 200mg  three times daily 2.  Increase diamox to 500mg  in the afternoon, 250mg  in the afternoon, and 500mg  at bedtime. 3.  Start applying lidocaine ointment to legs twice daily as needed.  Use with gloves. 4.  Return to clinic 3-6 months

## 2014-01-19 ENCOUNTER — Encounter: Payer: Self-pay | Admitting: *Deleted

## 2014-01-19 ENCOUNTER — Other Ambulatory Visit: Payer: Self-pay | Admitting: *Deleted

## 2014-01-19 ENCOUNTER — Telehealth: Payer: Self-pay | Admitting: *Deleted

## 2014-01-19 ENCOUNTER — Encounter (HOSPITAL_COMMUNITY)
Admission: RE | Admit: 2014-01-19 | Discharge: 2014-01-19 | Disposition: A | Discharge status: Home / Self Care | Payer: Medicaid - Out of State | Source: Ambulatory Visit | Attending: Cardiology | Admitting: Cardiology

## 2014-01-19 DIAGNOSIS — Z5189 Encounter for other specified aftercare: Secondary | ICD-10-CM | POA: Diagnosis not present

## 2014-01-19 MED ORDER — NEBIVOLOL HCL 10 MG PO TABS: 10.0000 mg | ORAL_TABLET | Freq: Every day | ORAL | Status: DC

## 2014-01-19 NOTE — Progress Notes (Signed)
Pt had 13 beat run of vtach at cardiac rehab today.  Pt c/o lightheadedness, dizziness and blurred vision during episode.  Symptoms resolved with return of sinus rhythm.  BP-120/74.  PC to [place], triage nurse for Dr. Meda Coffee. Strips faxed for Dr. Meda Coffee to review.  Per Adelfa Koh, pt to be instructed to take Bystolic 10mg  once daily.  Adelfa Koh confirmed this change with Dr. Meda Coffee due to pt history of increased wheezing on higher doses of bystolic.  Also, per [facility] and Dr. Meda Coffee, no indication for labwork at this time.   At pt request Adelfa Koh will send prescription for dose change to [facility].   Pt instructed to contact MD if symptoms return.  Understanding verbalized

## 2014-01-19 NOTE — Telephone Encounter (Signed)
Erica [name] from cardiac rehab called to report that pt had 13 beats of VT. Pt C/O of light headed, dizziness and dry mouth with the VT. The VT strip send to MD for review. Dr Meda Coffee recommends for pt to increased the Bystolic from 5 to 10 mg daily. Pt is aware prescription was send to [facility] at [address].   Erica [name]  at cardiac rehab suggested that pt may need BMET to check her potassium level.

## 2014-01-21 ENCOUNTER — Encounter (HOSPITAL_COMMUNITY)
Admission: RE | Admit: 2014-01-21 | Discharge: 2014-01-21 | Disposition: A | Discharge status: Home / Self Care | Payer: Medicaid - Out of State | Source: Ambulatory Visit | Attending: Cardiology | Admitting: Cardiology

## 2014-01-21 DIAGNOSIS — Z5189 Encounter for other specified aftercare: Secondary | ICD-10-CM | POA: Diagnosis not present

## 2014-01-22 ENCOUNTER — Emergency Department (HOSPITAL_COMMUNITY)
Admission: EM | Admit: 2014-01-22 | Discharge: 2014-01-22 | Disposition: A | Discharge status: Home / Self Care | Payer: Medicaid - Out of State | Attending: Emergency Medicine | Admitting: Emergency Medicine

## 2014-01-22 ENCOUNTER — Other Ambulatory Visit: Payer: Self-pay | Admitting: Oncology

## 2014-01-22 ENCOUNTER — Encounter (HOSPITAL_COMMUNITY): Payer: Self-pay | Admitting: Emergency Medicine

## 2014-01-22 DIAGNOSIS — Z880 Allergy status to penicillin: Secondary | ICD-10-CM | POA: Diagnosis not present

## 2014-01-22 DIAGNOSIS — I10 Essential (primary) hypertension: Secondary | ICD-10-CM | POA: Insufficient documentation

## 2014-01-22 DIAGNOSIS — Y9389 Activity, other specified: Secondary | ICD-10-CM | POA: Insufficient documentation

## 2014-01-22 DIAGNOSIS — Z862 Personal history of diseases of the blood and blood-forming organs and certain disorders involving the immune mechanism: Secondary | ICD-10-CM | POA: Insufficient documentation

## 2014-01-22 DIAGNOSIS — Y9289 Other specified places as the place of occurrence of the external cause: Secondary | ICD-10-CM | POA: Insufficient documentation

## 2014-01-22 DIAGNOSIS — K219 Gastro-esophageal reflux disease without esophagitis: Secondary | ICD-10-CM | POA: Diagnosis not present

## 2014-01-22 DIAGNOSIS — S0093XA Contusion of unspecified part of head, initial encounter: Secondary | ICD-10-CM

## 2014-01-22 DIAGNOSIS — Z7951 Long term (current) use of inhaled steroids: Secondary | ICD-10-CM | POA: Diagnosis not present

## 2014-01-22 DIAGNOSIS — Z79899 Other long term (current) drug therapy: Secondary | ICD-10-CM | POA: Diagnosis not present

## 2014-01-22 DIAGNOSIS — S8992XA Unspecified injury of left lower leg, initial encounter: Secondary | ICD-10-CM | POA: Diagnosis not present

## 2014-01-22 DIAGNOSIS — Z8669 Personal history of other diseases of the nervous system and sense organs: Secondary | ICD-10-CM | POA: Diagnosis not present

## 2014-01-22 DIAGNOSIS — J45909 Unspecified asthma, uncomplicated: Secondary | ICD-10-CM | POA: Insufficient documentation

## 2014-01-22 DIAGNOSIS — W19XXXA Unspecified fall, initial encounter: Secondary | ICD-10-CM

## 2014-01-22 DIAGNOSIS — W01198A Fall on same level from slipping, tripping and stumbling with subsequent striking against other object, initial encounter: Secondary | ICD-10-CM | POA: Insufficient documentation

## 2014-01-22 DIAGNOSIS — Z853 Personal history of malignant neoplasm of breast: Secondary | ICD-10-CM | POA: Diagnosis not present

## 2014-01-22 DIAGNOSIS — M25562 Pain in left knee: Secondary | ICD-10-CM

## 2014-01-22 MED ORDER — NAPROXEN 500 MG PO TABS
500.0000 mg | ORAL_TABLET | Freq: Once | ORAL | Status: AC
  Administered 2014-01-22: 500 mg via ORAL
  Filled 2014-01-22: qty 1

## 2014-01-22 MED ORDER — NAPROXEN 500 MG PO TABS: 500.0000 mg | ORAL_TABLET | Freq: Two times a day (BID) | ORAL | Status: DC

## 2014-01-22 NOTE — ED Provider Notes (Signed)
CSN: [id number]     Arrival date & time 01/22/14  1147 History  This chart was scribed for a non-physician practitioner, Vivi Barrack, PA-C, working with Dot Lanes, MD by Cathie Hoops, ED Scribe. The patient was seen in WTR8/[facility]TR[facility]. The patient's care was started at 12:55 PM.   Chief Complaint  Patient presents with  . Fall    The history is provided by the patient. No language interpreter was used.   HPI Comments: Erica [name] is a 38 y.o. female who presents to the Emergency Department complaining of new, moderate fall onset yesterday. She notes she woke up with a knot behind her left ear with associated pain. Pt has associated vision changes, dizziness, gait problem, left knee pain and neck pain. She notes her neck pain is aggravated by twisting. Pt states she was walking and tripped and fell. Pt notes she hit her head on a wall during the fall. Pt denies LOC. Pt states she landed on her left knee during the fall. She notes she took Aleve with minimal relief. She last took a dose of Aleve approximately 12 hours ago. Pt denies using cold or warm compresses to relieve her symptoms. Pt denies nausea, vomiting, fevers, or chills.  Pt has chronic asthma, breast cancer, hypertension, and neuropathy. Allergies: Penicillin PCP: Dr. [name] Pulmonologist: Dr. Melvyn Novas at [facility]  Past Medical History  Diagnosis Date  . Breast cancer 2010  . Asthma   . Hypertension   . GERD (gastroesophageal reflux disease)   . Impaired glucose tolerance 04/13/2011  . Anemia, unspecified 04/13/2011  . Asthma 09/27/2010  . Bloating 03/18/2011  . 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 Lymphadenitis, chronic     restricted LEFT extremity   Past Surgical History  Procedure Laterality Date  . Hand surgery  2012    right; tendon release  . Breast lumpectomy  09/2008    left  . Carpal tunnel release Left 2011  . Tubal ligation  05/11/2011    Procedure: POST PARTUM TUBAL LIGATION;  Surgeon: Emeterio Reeve, MD;   Location: [facility];  Service: Gynecology;  Laterality: Bilateral;  Induced for HTN  . Lymph node dissection  2010    left breast; 2 wks after breast lumpectomy   Family History  Problem Relation Age of Onset  . Diabetes Maternal Grandmother   . Heart disease Maternal Grandmother   . Hypertension Maternal Grandmother   . Cancer Neg Hx   . Alcohol abuse Neg Hx   . Early death Neg Hx   . Hyperlipidemia Neg Hx   . Kidney disease Neg Hx   . Stroke Neg Hx   . Colon cancer Neg Hx    History  Substance Use Topics  . Smoking status: Never Smoker   . Smokeless tobacco: Never Used  . Alcohol Use: 0.0 oz/week     Comment: occasionally   OB History   Grav Para Term Preterm Abortions TAB SAB Ect Mult Living   5 4 4  1  1   4      Review of Systems  Constitutional: Negative for fever.  Respiratory: Negative for shortness of breath.   Cardiovascular: Negative for chest pain.  Gastrointestinal: Negative for nausea and vomiting.  Musculoskeletal: Positive for arthralgias, gait problem and neck pain.  Neurological: Negative for syncope and headaches.   Allergies  Aspirin; Penicillins; Kiwi extract; and Strawberry  Home Medications   Prior to Admission medications   Medication Sig Start Date End Date Taking? Authorizing Provider  acetaZOLAMIDE (DIAMOX) 250 MG tablet Take 2 tab in the morning, 1 tab in afternoon, 2 tab at bedtime. 01/18/14   Donika K Patel, DO  albuterol (PROVENTIL HFA;VENTOLIN HFA) 108 (90 BASE) MCG/ACT inhaler Inhale 2 puffs into the lungs every 4 (four) hours as needed for wheezing or shortness of breath (((PLAN A))).     Historical Provider, MD  albuterol (PROVENTIL) (2.5 MG/3ML) 0.083% nebulizer solution Take 3 mLs (2.5 mg total) by nebulization every 4 (four) hours as needed for wheezing or shortness of breath (((PLAN B))). 04/06/13   Tammy S Parrett, NP  amLODipine-olmesartan (AZOR) 5-40 MG per tablet Take 2 tablets by mouth daily. 12/23/13   [name], MD  Biotin  1000 MCG tablet Take 1,000 mcg by mouth daily.    Historical Provider, MD  cloNIDine (CATAPRES) 0.3 MG tablet Take 1 tablet (0.3 mg total) by mouth 2 (two) times daily. 08/24/13   [name], MD  dextromethorphan-guaiFENesin [facility] DM) 30-600 MG per 12 hr tablet Take 1 tablet by mouth every 12 (twelve) hours as needed for cough.    Historical Provider, MD  diltiazem (CARDIZEM CD) 240 MG 24 hr capsule Take 1 capsule (240 mg total) by mouth daily. 11/30/13   [name], MD  HYDROcodone-acetaminophen (NORCO/VICODIN) 5-325 MG per tablet Take 1 tablet by mouth every 6 (six) hours as needed for moderate pain. 09/15/13   Evelina Bucy, MD  hyoscyamine (LEVSIN SL) 0.125 MG SL tablet Place 1 tablet (0.125 mg total) under the tongue every 8 (eight) hours as needed for cramping. 09/02/13   Jerene Bears, MD  isosorbide mononitrate (IMDUR) 30 MG 24 hr tablet Take 1 tablet (30 mg total) by mouth daily. 09/27/13   [name], MD  lidocaine (XYLOCAINE) 5 % ointment Apply with gloves, twice daily as needed to legs. 01/18/14   Donika K Patel, DO  LORazepam (ATIVAN) 0.5 MG tablet Take 0.5 mg by mouth every 8 (eight) hours as needed for anxiety. 01/07/13   Chauncey Cruel, MD  metoCLOPramide (REGLAN) 10 MG tablet Take 1 tablet (10 mg total) by mouth 4 (four) times daily -  with meals and at bedtime. 01/26/13   Jerene Bears, MD  mometasone (NASONEX) 50 MCG/ACT nasal spray Place 2 sprays into the nose daily. 07/27/13   Julianne Rice, MD  mometasone-formoterol [facility]) 200-5 MCG/ACT AERO Inhale 2 puffs into the lungs 2 (two) times daily. 04/06/13   Tammy S Parrett, NP  montelukast (SINGULAIR) 10 MG tablet Take 1 tablet (10 mg total) by mouth at bedtime. 07/14/13   Tanda Rockers, MD  naproxen (NAPROSYN) 500 MG tablet Take 1 tablet (500 mg total) by mouth 2 (two) times daily. 01/22/14   Azadeh Hyder A Forcucci, PA-C  nebivolol (BYSTOLIC) 10 MG tablet Take 1 tablet (10 mg total) by mouth daily. 01/19/14   [name], MD  nitroGLYCERIN (NITROSTAT) 0.4 MG SL tablet Place 1 tablet (0.4 mg total) under the tongue every 5 (five) minutes as needed for chest pain. 08/24/13   [name], MD  ondansetron (ZOFRAN) 4 MG tablet Take 1 tablet (4 mg total) by mouth every 6 (six) hours. 10/22/13   Tanna Furry, MD  ondansetron (ZOFRAN-ODT) 4 MG disintegrating tablet Take 1 tablet (4 mg total) by mouth every 6 (six) hours as needed for nausea or vomiting. 10/27/13   Domenic Moras, PA-C  oxyCODONE-acetaminophen (PERCOCET/ROXICET) 5-325 MG per tablet Take 2 tablets by mouth every 8 (eight) hours as needed  for severe pain. 10/27/13   Domenic Moras, PA-C  pantoprazole (PROTONIX) 40 MG tablet Take 1 tablet (40 mg total) by mouth 2 (two) times daily before a meal. 09/02/13   Jerene Bears, MD  potassium chloride SA (K-DUR,KLOR-CON) 20 MEQ tablet Take 20 mEq by mouth 2 (two) times daily. 06/04/12   [name], MD  pregabalin (LYRICA) 200 MG capsule Take 1 capsule (200 mg total) by mouth 3 (three) times daily. 01/18/14   Donika K Patel, DO  sucralfate (CARAFATE) 1 G tablet Take 1 tablet (1 g total) by mouth 4 (four) times daily. 03/07/12   [name], MD  traMADol (ULTRAM) 50 MG tablet Take 50 mg by mouth every 4 (four) hours as needed (1-2 tabs as needed for cough or pain).    Historical Provider, MD  triamterene-hydrochlorothiazide (MAXZIDE-25) 37.5-25 MG per tablet Take 1 tablet by mouth every morning.    Historical Provider, MD   Triage Vitals: Pulse 81  Temp(Src) 98.8 F (37.1 C) (Oral)  Resp 14  Ht 5\' 7"  (1.702 m)  Wt 204 lb (92.534 kg)  BMI 31.94 kg/m2  SpO2 100%  Physical Exam  Nursing note and vitals reviewed. Constitutional: She is oriented to person, place, and time. She appears well-developed and well-nourished. No distress.  HENT:  Head: Normocephalic and atraumatic.  Nose: Nose normal.  Mouth/Throat: Oropharynx is clear and moist. No oropharyngeal exudate.  No appreciable wounds or nodules to the  occipital region of the left skull.  There is tenderness to palpation over the left mastoid process  Eyes: Conjunctivae and EOM are normal. Pupils are equal, round, and reactive to light. No scleral icterus.  Neck: Normal range of motion. Neck supple. No JVD present. No tracheal deviation present. No thyromegaly present.  Cardiovascular: Normal rate, regular rhythm, normal heart sounds and intact distal pulses.  Exam reveals no gallop and no friction rub.   No murmur heard. Pulmonary/Chest: Effort normal and breath sounds normal. No respiratory distress. She has no wheezes. She has no rales. She exhibits no tenderness.  Musculoskeletal: Normal range of motion.       Left knee: She exhibits normal range of motion, no swelling, no effusion, no ecchymosis, no deformity, no laceration, no erythema, normal alignment, no LCL laxity, normal patellar mobility, no bony tenderness, normal meniscus and no MCL laxity. Tenderness found. Lateral joint line tenderness noted. No medial joint line, no MCL, no LCL and no patellar tendon tenderness noted.  Lymphadenopathy:    She has no cervical adenopathy.  Neurological: She is alert and oriented to person, place, and time. She has normal strength. No cranial nerve deficit or sensory deficit. She displays a negative Romberg sign. Coordination and gait normal.  Skin: Skin is warm and dry.  Psychiatric: She has a normal mood and affect. Her behavior is normal.    ED Course  Procedures (including critical care time) DIAGNOSTIC STUDIES: Oxygen Saturation is 100% on RA, normal by my interpretation.    COORDINATION OF CARE: 1:07 PM- Patient informed of current plan for treatment and evaluation and agrees with plan at this time.  MDM   Final diagnoses:  Fall, initial encounter  Left knee pain  Head contusion, initial encounter   Patient is a 38 y.o. Female who presents to the ED after fall.  Physical exam reveals no neurological deficits.  Patient ambulates  with a steady gait and has a negative romberg.  Suspect that the patient has a likely superficial contusion to both  the left knee and head.  Do not feel that given age and exam that imaging is warranted at this time.  Patient is stable for discharge.  Will treat with ice and naproxen 500 mg BID.  Patient to return for worsening confusion, lethargy, or any other concerning symptoms.  Patient states understanding and agreement.    I personally performed the services described in this documentation, which was scribed in my presence. The recorded information has been reviewed and is accurate.    Cherylann Parr, PA-C 01/22/14 1318

## 2014-01-22 NOTE — ED Provider Notes (Signed)
Medical screening examination/treatment/procedure(s) were performed by non-physician practitioner and as supervising physician I was immediately available for consultation/collaboration.    Dot Lanes, MD 01/22/14 [phone]

## 2014-01-22 NOTE — ED Notes (Signed)
Pt fell last night and hit her head on the wall. Pt denies loosing consciousness her sister states she was dazed and confused. She was negative for a knot last night but one came up this morning. Pt alert X4.

## 2014-01-22 NOTE — Discharge Instructions (Signed)
Fall Prevention and Home Safety Falls cause injuries and can affect all age groups. It is possible to use preventive measures to significantly decrease the likelihood of falls. There are many simple measures which can make your home safer and prevent falls. OUTDOORS  Repair cracks and edges of walkways and driveways.  Remove high doorway thresholds.  Trim shrubbery on the main path into your home.  Have good outside lighting.  Clear walkways of tools, rocks, debris, and clutter.  Check that handrails are not broken and are securely fastened. Both sides of steps should have handrails.  Have leaves, snow, and ice cleared regularly.  Use sand or salt on walkways during winter months.  In the garage, clean up grease or oil spills. BATHROOM  Install night lights.  Install grab bars by the toilet and in the tub and shower.  Use non-skid mats or decals in the tub or shower.  Place a plastic non-slip stool in the shower to sit on, if needed.  Keep floors dry and clean up all water on the floor immediately.  Remove soap buildup in the tub or shower on a regular basis.  Secure bath mats with non-slip, double-sided rug tape.  Remove throw rugs and tripping hazards from the floors. BEDROOMS  Install night lights.  Make sure a bedside light is easy to reach.  Do not use oversized bedding.  Keep a telephone by your bedside.  Have a firm chair with side arms to use for getting dressed.  Remove throw rugs and tripping hazards from the floor. KITCHEN  Keep handles on pots and pans turned toward the center of the stove. Use back burners when possible.  Clean up spills quickly and allow time for drying.  Avoid walking on wet floors.  Avoid hot utensils and knives.  Position shelves so they are not too high or low.  Place commonly used objects within easy reach.  If necessary, use a sturdy step stool with a grab bar when reaching.  Keep electrical cables out of the  way.  Do not use floor polish or wax that makes floors slippery. If you must use wax, use non-skid floor wax.  Remove throw rugs and tripping hazards from the floor. STAIRWAYS  Never leave objects on stairs.  Place handrails on both sides of stairways and use them. Fix any loose handrails. Make sure handrails on both sides of the stairways are as long as the stairs.  Check carpeting to make sure it is firmly attached along stairs. Make repairs to worn or loose carpet promptly.  Avoid placing throw rugs at the top or bottom of stairways, or properly secure the rug with carpet tape to prevent slippage. Get rid of throw rugs, if possible.  Have an electrician put in a light switch at the top and bottom of the stairs. OTHER FALL PREVENTION TIPS  Wear low-heel or rubber-soled shoes that are supportive and fit well. Wear closed toe shoes.  When using a stepladder, make sure it is fully opened and both spreaders are firmly locked. Do not climb a closed stepladder.  Add color or contrast paint or tape to grab bars and handrails in your home. Place contrasting color strips on first and last steps.  Learn and use mobility aids as needed. Install an electrical emergency response system.  Turn on lights to avoid dark areas. Replace light bulbs that burn out immediately. Get light switches that glow.  Arrange furniture to create clear pathways. Keep furniture in the same place.  Firmly attach carpet with non-skid or double-sided tape. °· Eliminate uneven floor surfaces. °· Select a carpet pattern that does not visually hide the edge of steps. °· Be aware of all pets. °OTHER HOME SAFETY TIPS °· Set the water temperature for 120° F (48.8° C). °· Keep emergency numbers on or near the telephone. °· Keep smoke detectors on every level of the home and near sleeping areas. °Document Released: 03/08/2002 Document Revised: 09/17/2011 Document Reviewed: 06/07/2011 °ExitCare® Patient Information ©2015  [employer]. This information is not intended to replace advice given to you by your health care provider. Make sure you discuss any questions you have with your health care provider. °Contusion °A contusion is a deep bruise. Contusions are the result of an injury that caused bleeding under the skin. The contusion may turn blue, purple, or yellow. Minor injuries will give you a painless contusion, but more severe contusions may stay painful and swollen for a few weeks.  °CAUSES  °A contusion is usually caused by a blow, trauma, or direct force to an area of the body. °SYMPTOMS  °· Swelling and redness of the injured area. °· Bruising of the injured area. °· Tenderness and soreness of the injured area. °· Pain. °DIAGNOSIS  °The diagnosis can be made by taking a history and physical exam. An X-ray, CT scan, or MRI may be needed to determine if there were any associated injuries, such as fractures. °TREATMENT  °Specific treatment will depend on what area of the body was injured. In general, the best treatment for a contusion is resting, icing, elevating, and applying cold compresses to the injured area. Over-the-counter medicines may also be recommended for pain control. Ask your caregiver what the best treatment is for your contusion. °HOME CARE INSTRUCTIONS  °· Put ice on the injured area. °· Put ice in a plastic bag. °· Place a towel between your skin and the bag. °· Leave the ice on for 15-20 minutes, 3-4 times a day, or as directed by your health care provider. °· Only take over-the-counter or prescription medicines for pain, discomfort, or fever as directed by your caregiver. Your caregiver may recommend avoiding anti-inflammatory medicines (aspirin, ibuprofen, and naproxen) for 48 hours because these medicines may increase bruising. °· Rest the injured area. °· If possible, elevate the injured area to reduce swelling. °SEEK IMMEDIATE MEDICAL CARE IF:  °· You have increased bruising or swelling. °· You have  pain that is getting worse. °· Your swelling or pain is not relieved with medicines. °MAKE SURE YOU:  °· Understand these instructions. °· Will watch your condition. °· Will get help right away if you are not doing well or get worse. °Document Released: 12/26/2004 Document Revised: 03/23/2013 Document Reviewed: 01/21/2011 °ExitCare® Patient Information ©2015 [employer]. This information is not intended to replace advice given to you by your health care provider. Make sure you discuss any questions you have with your health care provider. ° °

## 2014-01-22 NOTE — Progress Notes (Unsigned)
Erica [name] completed a survey showing significant stress and depression scores. We are going to move up her appt to discuss antidepressants. Note that she had CXR and abd CT June 2015 showing no eidence of recurrent disease

## 2014-01-22 NOTE — ED Notes (Signed)
Bed: WA02 Expected date:  Expected time:  Means of arrival:  Comments: 

## 2014-01-22 NOTE — ED Notes (Signed)
She states she tripped and fell in her home yesterday evening.  C/o soreness behind left ear.  She denies any neck soreness and states she has minimal discomfort of left knee "but I can walk just fine".  She is alert and oriented x 4 with clear speech.

## 2014-01-24 ENCOUNTER — Telehealth: Payer: Self-pay | Admitting: Oncology

## 2014-01-24 ENCOUNTER — Encounter (HOSPITAL_COMMUNITY)
Admission: RE | Admit: 2014-01-24 | Discharge: 2014-01-24 | Disposition: A | Discharge status: Home / Self Care | Payer: Medicaid - Out of State | Source: Ambulatory Visit | Attending: Cardiology | Admitting: Cardiology

## 2014-01-24 ENCOUNTER — Telehealth: Payer: Self-pay | Admitting: Internal Medicine

## 2014-01-24 DIAGNOSIS — Z5189 Encounter for other specified aftercare: Secondary | ICD-10-CM | POA: Diagnosis not present

## 2014-01-24 NOTE — Telephone Encounter (Signed)
Error.  Decided to schedule an appt.  Erica [name]

## 2014-01-24 NOTE — Telephone Encounter (Signed)
, °

## 2014-01-26 ENCOUNTER — Ambulatory Visit (INDEPENDENT_AMBULATORY_CARE_PROVIDER_SITE_OTHER): Payer: Medicaid - Out of State | Admitting: Adult Health

## 2014-01-26 ENCOUNTER — Encounter (HOSPITAL_COMMUNITY)
Admission: RE | Admit: 2014-01-26 | Discharge: 2014-01-26 | Disposition: A | Discharge status: Home / Self Care | Payer: Medicaid - Out of State | Source: Ambulatory Visit | Attending: Cardiology | Admitting: Cardiology

## 2014-01-26 ENCOUNTER — Encounter: Payer: Self-pay | Admitting: Adult Health

## 2014-01-26 VITALS — BP 104/66 | HR 83 | Temp 97.0°F | Ht 66.0 in | Wt 204.3 lb

## 2014-01-26 DIAGNOSIS — J45901 Unspecified asthma with (acute) exacerbation: Secondary | ICD-10-CM

## 2014-01-26 DIAGNOSIS — Z5189 Encounter for other specified aftercare: Secondary | ICD-10-CM | POA: Diagnosis not present

## 2014-01-26 MED ORDER — ALBUTEROL SULFATE (2.5 MG/3ML) 0.083% IN NEBU: 2.5000 mg | INHALATION_SOLUTION | RESPIRATORY_TRACT | Status: DC | PRN

## 2014-01-26 MED ORDER — MONTELUKAST SODIUM 10 MG PO TABS: 10.0000 mg | ORAL_TABLET | Freq: Every day | ORAL | Status: DC

## 2014-01-26 MED ORDER — ALBUTEROL SULFATE HFA 108 (90 BASE) MCG/ACT IN AERS: 2.0000 | INHALATION_SPRAY | RESPIRATORY_TRACT | Status: DC | PRN

## 2014-01-26 MED ORDER — PREDNISONE 10 MG PO TABS: ORAL_TABLET | ORAL | Status: DC

## 2014-01-26 MED ORDER — HYDROCODONE-HOMATROPINE 5-1.5 MG/5ML PO SYRP: 5.0000 mL | ORAL_SOLUTION | ORAL | Status: DC | PRN

## 2014-01-26 MED ORDER — MOMETASONE FURO-FORMOTEROL FUM 200-5 MCG/ACT IN AERO: 2.0000 | INHALATION_SPRAY | Freq: Two times a day (BID) | RESPIRATORY_TRACT | Status: DC

## 2014-01-26 MED ORDER — HYDROCODONE-HOMATROPINE 5-1.5 MG/5ML PO SYRP: 5.0000 mL | ORAL_SOLUTION | Freq: Four times a day (QID) | ORAL | Status: DC | PRN

## 2014-01-26 MED ORDER — DOXYCYCLINE HYCLATE 100 MG PO TABS: 100.0000 mg | ORAL_TABLET | Freq: Two times a day (BID) | ORAL | Status: DC

## 2014-01-26 NOTE — Patient Instructions (Signed)
Doxycyline 100mg  Twice daily  For 7 days  Mucinex DM Twice daily  As needed  Cough Prednisone taper over next week.  Hydromet 1-2 tsp every 6hr as needed for cough , do not take with tramadol or vicodin , as it may make you sleepy.  Please contact office for sooner follow up if symptoms do not improve or worsen or seek emergency care  Follow up Dr. Melvyn Novas  In 3 months and As needed

## 2014-01-26 NOTE — Progress Notes (Signed)
Subjective:    Patient ID: Erica [name], female    DOB: Nov 12, 1975   MRN: [id number]    Brief patient profile:  59 yobf with asthma all her life much worse since around 07/2010 referred by Dr [name] to pulmonary clinic in Sept 2012 Hx of Breast Cancer 05/2008    History of Present Illness  12/18/2010 Initial pulmonary office eval on acei and   advair cc dtc asthma x 4 months but baseline = excess saba use "even when better" at least twice daily despite daily advair, worse at hs in fact  used saba 3 x last night.  On chemo /RT for breast ca but last rx was Jan 2012 well before asthma worse.  To er multiple times >  no better with singular added x 3 months. rec Stop Lisinopril Dulera 200 Take 2 puffs first thing in am and then another 2 puffs about 12 hours later.  Work on inhaler technique  Nexium 40 mg   Take 30-60 min before first meal of the day and Pepcid 20 mg one bedtime until return Only use your albuterol as a rescue medication to be used if you can't catch your breath by resting or doing a relaxed purse lip breathing pattern. The less you use it, the better it will work when you need it.  Prednisone 10 mg take  4 each am x 2 days,   2 each am x 2 days,  1 each am x2days and stop    01/03/2011 f/u ov/Wert cc much better off ace and advair but still needing saba twice daily on avg. No  Purulent sputum. rec no change in rx/ rule of 2's reviewed       09/11/2012 f/u ov/Wert "did fine" p prev ov then resumed advair 250  And now on normodyne Chief Complaint  Patient presents with  . Acute Visit    Pt c/o increased cough and SOBfor the past 2 wks. Cough is occ prod with minimal clear sputum. Breathing is esp worse at night, waking up 2-3 times at night to use neb.   >>Prednisone 10 mg take  4 each am x 2 days,   2 each am x 2 days,  1 each am x 2 days and stop  Stop advair and normodyne bystolic 20 mg every 12 hours (2 x 10 every 12 hours)  dulera 100 Take 2 puffs first thing in  am and then another 2 puffs about 12 hours later   09/25/2012 Med calendar  Returns for 1 week follow up for med review  We reviewed all her meds .Data base down for med calendars.  She depends on samples , rx to [place] assistance program and generics -as she does not have insurance.  We discussed drug assistance program for [facility].  Will need to change bystolic to bisoprolol for cost issues.  Last visit labs revealed neg d dimer and bnp. CXR w/out acute process.  She feels about the same still gets DOE . Occasional cough and wheezing on/off.  No fever or discolored mucus.  Lives part-time in [facility].  rec Stop Ipratropium Neb.  Continue on current regimen .  We will change Bystolic to Bisoprolol 10mg  Twice daily   We will get paperwork for Drug assistance program for [facility]    11/20/2012 f/u ov/Wert re asthma Chief Complaint  Patient presents with  . Asthma    Breathing has gotten worse since last OV. Pt reports increased wheezing and SOB and DOE. Pt  using rescue inhaler  cough is variably quite severe worse at hs takes 2 tramadol to control, no excess mucus production. Barking quality. Breathing much better when not coughing, not much better with saba. Present 24/7 but better p the 2 tramadol at hs.  Coughs so hard hurts ant cp mostly center >no changes   12/07/12 Follow up and Med review  new med calendar - pt brought all meds with her today.   We reviewed all her meds and organized them into a patient medication calendar.  It appears she is taking her meds correctly however she depends on samples and drug assistance program for some meds.  She does complain over last 2 weeks of cough, congestion, sinus drainage and wheezing  No fever, chest pain or orthopnea.  OTC cold meds not working on congestion.  rec Zpack take as directed  Mucinex DM Twice daily   Prednisone taper over next week  Follow med calendar closely and bring to each visit.    01/12/2013 f/u ov/Wert re: dtc  asthma Chief Complaint  Patient presents with  . Follow-up    PFT done today.   Even on prednisone still need albuterol hfa avg once at night and neb saba also w/in 2 h of saba hfa for noct cough/ wheeze but much less sob/wheeze during the day eg with walk then worse in 2 weeks despite dulera 100 2bid.  rec Increase dulera to 200 Take 2 puffs first thing in am and then another 2 puffs about 12 hours later.  I will contact Dr Hilarie Fredrickson regarding your night time spells that don't resolve  even on the prednisone Prednisone 10 mg take  4 each am x 2 days,   2 each am x 2 days,  1 each am x 2 days and stop Please schedule a follow up office visit in 4 weeks, sooner if needed with all meds and inhalers in hand ? Why not use reglan at hs Be sure using mucinex dm and tramadol correctly    02/09/2013 f/u ov/Wert re: dtc asthma, did not bring med calendar Chief Complaint  Patient presents with  . Follow-up    Pt states that her breathing is doing well.  She does use her rescue inhaler approx twice per day and has used neb x 3 in the past wk  Symptoms provoked by walking by around the block but this is an improvement over baseline  Nocturnal episodes sev hours p going to bed "gasping for breath" some better p started reglan at hs  >>dulera , trial off singulair   04/06/2013 Follow up and Med Review   Returns for follow up and med review  We reviewed all her meds and organized them into a med calendar. She does have Medicaid but depends on samples. Did not bring all her meds with her today , says they are at home.  Says her breathing is the same since last ov w/ SOB, Has on /off wheezing, some chest heaviness, prod cough with yellow mucus. Good and bad days. Wears out easily .  Denies f/c/s, nausea, vomiting.  Did not stop singulair as recommended.  rec  Follow med calendar closely and bring to each visit > did not do  We are referring you for a CPSTwith spirometry before and after> never  done   07/14/2013 f/u ov/Wert re: dtc asthma vs vcd / no med calendar/ confused with meds/instructions  Chief Complaint  Patient presents with  . Follow-up    Pt states  her breathing has been worse for the past 6 wks. She states that she feels out of breath all the time. She also states having increased cough x 6 wks- prod with large amounts of tan colored sputum.   not able to verify what she's taking at this point but "nothing works" including saba  >>  07/28/2013 Follow up and Med Review  Patient returns for a followup and medication review. We reviewed all her medications organized them into a medication calendar with patient education. She has many med changes and is confused with several meds. We reviewed all the recent changes and updated her list w/ education.Called her pharmacy to verify/update.   Patient reports that she was recently seen in emergency room on April 27 due to chest pain, dizziness and asthma flare. She was given a Depo-Medrol shot, meclizine. And prednisone taper.   She says that she is feeling better. CP and dizziness resolved.  CXR w/ nad , CT head was neg . She denies any hemoptysis, chest pain, orthopnea, PND, or leg swelling. >>taper off pred.   01/26/2014 Acute OV (Asthma and Cough)  Complains of prod cough with yellow mucus (worse at night), wheezing, increased SOB, some tightness worse for last couple of weeks.  Cough has never went away.  Reports symptoms have not improved since last ov.   Denies f/c/s, n/v/d, hemoptysis, chest pain, orthopnea or edema.  Remains on Dulera Twice daily  .  Using tramadol and mucinex dm for cough with some help.  She is followed by Neurology for neuropathy d/t chemo, peeudotumor cerbri,  Hx of Breast cancer s/p lumpectomy/radiation/chemo.      Current Medications, Allergies, Complete Past Medical History, Past Surgical History, Family History, and Social History were reviewed in [employer]  record.  ROS  The following are not active complaints unless bolded sore throat, dysphagia, dental problems, itching, sneezing,  nasal congestion or excess/ purulent secretions, ear ache,   fever, chills, sweats, unintended wt loss, pleuritic or exertional cp, hemoptysis,  orthopnea pnd or leg swelling, presyncope, palpitations, heartburn, abdominal pain, anorexia, nausea, vomiting, diarrhea  or change in bowel or urinary habits, change in stools or urine, dysuria,hematuria,  rash, arthralgias, visual complaints, headache, numbness weakness or ataxia or problems with walking or coordination,  change in mood/affect or memory.               Objective:   Physical Exam  Obese bf nad    Wt 213  12/18/10 > 01/03/2011  214 > 01/31/2011  206 >  09/11/2012 209 >208 6/19 > 210 09/25/2012 > 11/20/2012 211 >208 12/07/12> 01/12/2013 209 > 02/09/2013 210 >207 04/06/2013 > 07/14/2013  213>212 07/28/2013 >204 01/26/2014   HEENT: nl dentition, turbinates, and orophanx. Nl external ear canals without cough reflex   NECK :  without JVD/Nodes/TM/ nl carotid upstrokes bilaterally   LUNGS: lungs clear bilaterally with good air movement/ mild pseudowheeze   CV:  RRR  no s3 or murmur or increase in P2, no edema   ABD:  soft and nontender with nl excursion in the supine position. No bruits or organomegaly, bowel sounds nl  MS:  warm without deformities, calf tenderness, cyanosis or clubbing    CXR  09/15/13   The lungs are clear. Heart size and pulmonary vascularity are  normal. No adenopathy. No pneumothorax. No bone lesions.

## 2014-01-26 NOTE — Progress Notes (Signed)
Pt fell asleep during relaxation/meditation period of cardiac rehab.  Pt easily aroused. Pt unable to remember falling asleep. Pt reports this occurred earlier today at [facility]'s office while waiting to be seen. Pt denies pain, dyspnea, lightheadedness or dizziness.  Pt alert and oriented x4.   Pt brought to treatment room via wheelchair.  Initial BP lying:  79/33 HR-80, 81/52 sitting, 86/40 standing.  Pt given 12 oz gatorade.  pc to [facility] [place] to review pt drowsiness and hypotension.  Order given to decrease bystolic to 5mg  daily.  Pt instructed to hold bystolic tonight, then cut dose to 5mg  daily tomorrow.  [place] for pt to drive home.   Pt previously scheduled appt with Dr. [name] tomorrow.  Recheck BP:  119/64 lying, 123/69 sitting, 102/58 standing.  Pt asymptomatic.  Pt reports she has not been getting adequate anxiety and depression relief from current regimen.  Pt states she has been taking lorazepam BID daily.    Pt instructed to discuss these symptoms and the increased drowsiness with Dr. [name] tomorrow.  Understanding verbalized.

## 2014-01-27 ENCOUNTER — Ambulatory Visit: Payer: Medicaid - Out of State | Admitting: Internal Medicine

## 2014-01-27 NOTE — Assessment & Plan Note (Addendum)
Flare with bronchitis complicated by cyclical cough    Plan  Doxycyline 100mg  Twice daily  For 7 days  Mucinex DM Twice daily  As needed  Cough Prednisone taper over next week.  Hydromet 1-2 tsp every 6hr as needed for cough , do not take with tramadol or vicodin , as it may make you sleepy.  Please contact office for sooner follow up if symptoms do not improve or worsen or seek emergency care  Follow up Dr. Melvyn Novas  In 3 months and As needed

## 2014-01-28 ENCOUNTER — Encounter (HOSPITAL_COMMUNITY)
Admission: RE | Admit: 2014-01-28 | Discharge: 2014-01-28 | Disposition: A | Discharge status: Home / Self Care | Payer: Medicaid - Out of State | Source: Ambulatory Visit | Attending: Cardiology | Admitting: Cardiology

## 2014-01-28 ENCOUNTER — Encounter: Payer: Self-pay | Admitting: Neurology

## 2014-01-28 ENCOUNTER — Other Ambulatory Visit: Payer: Self-pay | Admitting: Nurse Practitioner

## 2014-01-28 ENCOUNTER — Ambulatory Visit (INDEPENDENT_AMBULATORY_CARE_PROVIDER_SITE_OTHER): Payer: Medicaid - Out of State | Admitting: Internal Medicine

## 2014-01-28 ENCOUNTER — Encounter: Payer: Self-pay | Admitting: Internal Medicine

## 2014-01-28 ENCOUNTER — Telehealth (HOSPITAL_COMMUNITY): Payer: Self-pay | Admitting: Cardiac Rehabilitation

## 2014-01-28 ENCOUNTER — Other Ambulatory Visit (INDEPENDENT_AMBULATORY_CARE_PROVIDER_SITE_OTHER): Payer: Medicaid - Out of State

## 2014-01-28 VITALS — BP 114/62 | HR 72 | Temp 98.2°F | Resp 16 | Ht 66.0 in | Wt 204.0 lb

## 2014-01-28 DIAGNOSIS — C50412 Malignant neoplasm of upper-outer quadrant of left female breast: Secondary | ICD-10-CM

## 2014-01-28 DIAGNOSIS — I10 Essential (primary) hypertension: Secondary | ICD-10-CM

## 2014-01-28 DIAGNOSIS — E876 Hypokalemia: Secondary | ICD-10-CM

## 2014-01-28 DIAGNOSIS — Z5189 Encounter for other specified aftercare: Secondary | ICD-10-CM | POA: Diagnosis not present

## 2014-01-28 DIAGNOSIS — G620 Drug-induced polyneuropathy: Secondary | ICD-10-CM

## 2014-01-28 LAB — BASIC METABOLIC PANEL
BUN: 18 mg/dL (ref 6–23)
CALCIUM: 8.9 mg/dL (ref 8.4–10.5)
CO2: 18 meq/L — AB (ref 19–32)
Chloride: 112 mEq/L (ref 96–112)
Creatinine, Ser: 1.5 mg/dL — ABNORMAL HIGH (ref 0.4–1.2)
GFR: 49.37 mL/min — ABNORMAL LOW (ref >60.00)
GLUCOSE: 105 mg/dL — AB (ref 70–99)
Potassium: 3.9 mEq/L (ref 3.5–5.1)
Sodium: 139 mEq/L (ref 135–145)

## 2014-01-28 MED ORDER — POTASSIUM CHLORIDE ER 10 MEQ PO TBCR: 10.0000 meq | EXTENDED_RELEASE_TABLET | Freq: Once | ORAL | Status: DC

## 2014-01-28 MED ORDER — EPINEPHRINE 0.3 MG/0.3ML IJ SOAJ: 0.3000 mg | Freq: Once | INTRAMUSCULAR | Status: DC

## 2014-01-28 MED ORDER — TRIAMTERENE-HCTZ 37.5-25 MG PO TABS: 1.0000 | ORAL_TABLET | Freq: Every morning | ORAL | Status: DC

## 2014-01-28 MED ORDER — ONDANSETRON 4 MG PO TBDP: 4.0000 mg | ORAL_TABLET | Freq: Four times a day (QID) | ORAL | Status: DC | PRN

## 2014-01-28 MED ORDER — PREDNISONE 10 MG PO TABS: ORAL_TABLET | ORAL | Status: DC

## 2014-01-28 NOTE — Progress Notes (Signed)
Pre visit review using our clinic review tool, if applicable. No additional management support is needed unless otherwise documented below in the visit note. 

## 2014-01-28 NOTE — Progress Notes (Signed)
Pt had episode of hypotension post exercise 84/60 pt felt drowsy.  Pt given H20 recheck 108/60.  Pt has decreased bystolic 5mg  daily.  Pt was seen this am by Dr. [name].  No new medications to report.  Pt weight up 1kg at cardiac rehab in 2 days.  Pt is c/o mild edema bilateral feet.  Pt denies dyspnea, lungs clear.  Pt instructed to elevate feet this weekend, follow strict low Na diet, check daily weights at home.  appt scheduled with Dr. Meda Coffee Monday 01/31/14 to elevate symptomatic hypotension and edema.  Pt unable to go to visit at that time, next available appt December.  Pt will keep appt as scheduled with Dr. Fredric Dine Monday.  Will continue to monitor BP and pt symptoms.

## 2014-01-28 NOTE — Telephone Encounter (Signed)
Message copied by Lowell Guitar on Fri Jan 28, 2014  6:41 AM ------      Message from: [name]      Created: Wed Jan 26, 2014  5:06 PM      Regarding: RE: Cardiac Rehab        I agree with Luke's recommendation, thank you for letting me know.      Houston Siren      ----- Message -----         From: Lowell Guitar, RN         Sent: 01/26/2014   4:55 PM           To: [name], MD      Subject: Cardiac Rehab                                            Dear Dr. Meda Coffee,            Pt had episode of hypotension post exercise at cardiac rehab.  Pt felt drowsy and fell asleep during relaxation/meditation.  Pt easily aroused.  Alert and oriented x4.  BP-79/33, HR-80 lying, 81/52 Hr-81 sitting, 86/40 HR-83 standing.  Pt given gatorade with rechecks 117/58 sitting, 119/64 lying, 123/69 sitting, 102/58 standing.  I spoke to [facility], [place] who decreased bystolic to 5mg  daily.  Pt otherwise asymptomatic.  I faxed rehab reports with vital sign trends to your office for review.              Thank you,      Andi Hence, RN, BSN      Cardiac Pulmonary Rehab             ------

## 2014-01-28 NOTE — Progress Notes (Signed)
Subjective:    Patient ID: Erica [name], female    DOB: [birth year], 38 y.o.   MRN: [id number]  Hypertension This is a chronic problem. The current episode started more than 1 year ago. The problem is unchanged. The problem is controlled. Associated symptoms include anxiety and malaise/fatigue. Pertinent negatives include no blurred vision, chest pain, headaches, neck pain, orthopnea, palpitations, peripheral edema, PND, shortness of breath or sweats. Agents associated with hypertension include NSAIDs. Past treatments include diuretics, calcium channel blockers, central alpha agonists and beta blockers. The current treatment provides significant improvement. Compliance problems include diet and exercise.  Hypertensive end-organ damage includes kidney disease. Identifiable causes of hypertension include chronic renal disease.      Review of Systems  Constitutional: Positive for malaise/fatigue and fatigue. Negative for fever, chills, diaphoresis, activity change, appetite change and unexpected weight change.  HENT: Negative.   Eyes: Negative.  Negative for blurred vision.  Respiratory: Negative.  Negative for cough, choking, chest tightness, shortness of breath and stridor.   Cardiovascular: Negative.  Negative for chest pain, palpitations, orthopnea and PND.  Gastrointestinal: Negative.  Negative for nausea, vomiting, abdominal pain, diarrhea, constipation and blood in stool.  Endocrine: Negative.   Genitourinary: Negative.  Negative for dysuria, urgency, frequency, hematuria, decreased urine volume, enuresis and difficulty urinating.  Musculoskeletal: Positive for myalgias and arthralgias. Negative for back pain, joint swelling, gait problem, neck pain and neck stiffness.  Skin: Negative.  Negative for rash.  Allergic/Immunologic: Negative.   Neurological: Negative.  Negative for headaches.  Hematological: Negative.  Negative for adenopathy. Does not bruise/bleed easily.    Psychiatric/Behavioral: Negative.        Objective:   Physical Exam  Constitutional: She is oriented to person, place, and time. She appears well-developed and well-nourished. No distress.  HENT:  Head: Normocephalic and atraumatic.  Mouth/Throat: Oropharynx is clear and moist. No oropharyngeal exudate.  Eyes: Conjunctivae are normal. Right eye exhibits no discharge. Left eye exhibits no discharge. No scleral icterus.  Neck: Normal range of motion. Neck supple. No JVD present. No tracheal deviation present. No thyromegaly present.  Cardiovascular: Normal rate, regular rhythm, normal heart sounds and intact distal pulses.  Exam reveals no gallop and no friction rub.   No murmur heard. Pulmonary/Chest: Effort normal and breath sounds normal. No stridor. No respiratory distress. She has no wheezes. She has no rales. She exhibits no tenderness.  Abdominal: Soft. Bowel sounds are normal. She exhibits no distension and no mass. There is no tenderness. There is no rebound and no guarding.  Musculoskeletal: Normal range of motion. She exhibits no edema or tenderness.  Lymphadenopathy:    She has no cervical adenopathy.  Neurological: She is oriented to person, place, and time.  Skin: Skin is warm and dry. No rash noted. She is not diaphoretic. No erythema. No pallor.  Psychiatric: She has a normal mood and affect. Her behavior is normal. Judgment and thought content normal.  Vitals reviewed.    Lab Results  Component Value Date   WBC 5.9 10/06/2013   HGB 13.3 10/06/2013   HCT 40.4 10/06/2013   PLT 225 10/06/2013   GLUCOSE 111 10/06/2013   CHOL 196 02/16/2013   TRIG 97.0 02/16/2013   HDL 47.30 02/16/2013   LDLCALC 129* 02/16/2013   ALT 39 10/06/2013   AST 28 10/06/2013   NA 138 10/06/2013   K 3.5 10/06/2013   CL 102 09/15/2013   CREATININE 0.9 10/06/2013   BUN 5.5* 10/06/2013   CO2 25  10/06/2013   TSH 0.90 07/14/2013   INR 1.1* 06/03/2012   HGBA1C 5.2 03/02/2012       Assessment & Plan:

## 2014-01-28 NOTE — Patient Instructions (Signed)

## 2014-01-29 ENCOUNTER — Encounter (HOSPITAL_COMMUNITY): Payer: Self-pay | Admitting: Emergency Medicine

## 2014-01-29 ENCOUNTER — Emergency Department (HOSPITAL_COMMUNITY)
Admission: EM | Admit: 2014-01-29 | Discharge: 2014-01-29 | Disposition: A | Discharge status: Home / Self Care | Payer: Medicaid - Out of State | Attending: Emergency Medicine | Admitting: Emergency Medicine

## 2014-01-29 DIAGNOSIS — Y9389 Activity, other specified: Secondary | ICD-10-CM | POA: Diagnosis not present

## 2014-01-29 DIAGNOSIS — Z79899 Other long term (current) drug therapy: Secondary | ICD-10-CM | POA: Diagnosis not present

## 2014-01-29 DIAGNOSIS — J40 Bronchitis, not specified as acute or chronic: Secondary | ICD-10-CM

## 2014-01-29 DIAGNOSIS — J209 Acute bronchitis, unspecified: Secondary | ICD-10-CM | POA: Diagnosis not present

## 2014-01-29 DIAGNOSIS — Z880 Allergy status to penicillin: Secondary | ICD-10-CM | POA: Insufficient documentation

## 2014-01-29 DIAGNOSIS — K219 Gastro-esophageal reflux disease without esophagitis: Secondary | ICD-10-CM | POA: Insufficient documentation

## 2014-01-29 DIAGNOSIS — B370 Candidal stomatitis: Secondary | ICD-10-CM

## 2014-01-29 DIAGNOSIS — Z7951 Long term (current) use of inhaled steroids: Secondary | ICD-10-CM | POA: Insufficient documentation

## 2014-01-29 DIAGNOSIS — Z862 Personal history of diseases of the blood and blood-forming organs and certain disorders involving the immune mechanism: Secondary | ICD-10-CM | POA: Diagnosis not present

## 2014-01-29 DIAGNOSIS — T364X5A Adverse effect of tetracyclines, initial encounter: Secondary | ICD-10-CM | POA: Insufficient documentation

## 2014-01-29 DIAGNOSIS — J45901 Unspecified asthma with (acute) exacerbation: Secondary | ICD-10-CM | POA: Insufficient documentation

## 2014-01-29 DIAGNOSIS — Z853 Personal history of malignant neoplasm of breast: Secondary | ICD-10-CM | POA: Insufficient documentation

## 2014-01-29 DIAGNOSIS — I10 Essential (primary) hypertension: Secondary | ICD-10-CM | POA: Insufficient documentation

## 2014-01-29 DIAGNOSIS — T7840XA Allergy, unspecified, initial encounter: Secondary | ICD-10-CM

## 2014-01-29 DIAGNOSIS — Z7952 Long term (current) use of systemic steroids: Secondary | ICD-10-CM | POA: Diagnosis not present

## 2014-01-29 DIAGNOSIS — Z792 Long term (current) use of antibiotics: Secondary | ICD-10-CM | POA: Insufficient documentation

## 2014-01-29 DIAGNOSIS — Y9289 Other specified places as the place of occurrence of the external cause: Secondary | ICD-10-CM | POA: Diagnosis not present

## 2014-01-29 DIAGNOSIS — G629 Polyneuropathy, unspecified: Secondary | ICD-10-CM | POA: Insufficient documentation

## 2014-01-29 DIAGNOSIS — R1310 Dysphagia, unspecified: Secondary | ICD-10-CM | POA: Insufficient documentation

## 2014-01-29 MED ORDER — RANITIDINE HCL 150 MG PO TABS: 300.0000 mg | ORAL_TABLET | Freq: Every day | ORAL | Status: DC

## 2014-01-29 MED ORDER — DIPHENHYDRAMINE HCL 25 MG PO CAPS
50.0000 mg | ORAL_CAPSULE | Freq: Once | ORAL | Status: AC
  Administered 2014-01-29: 50 mg via ORAL
  Filled 2014-01-29: qty 2

## 2014-01-29 MED ORDER — NYSTATIN 100000 UNIT/ML MT SUSP: 500000.0000 [IU] | Freq: Four times a day (QID) | OROMUCOSAL | Status: DC

## 2014-01-29 MED ORDER — LEVOFLOXACIN 750 MG PO TABS: 750.0000 mg | ORAL_TABLET | Freq: Every day | ORAL | Status: DC

## 2014-01-29 MED ORDER — RANITIDINE HCL 150 MG/10ML PO SYRP
300.0000 mg | ORAL_SOLUTION | Freq: Once | ORAL | Status: AC
  Administered 2014-01-29: 300 mg via ORAL
  Filled 2014-01-29: qty 20

## 2014-01-29 MED ORDER — DIPHENHYDRAMINE HCL 25 MG PO TABS: 50.0000 mg | ORAL_TABLET | Freq: Four times a day (QID) | ORAL | Status: DC

## 2014-01-29 MED ORDER — PREDNISONE 20 MG PO TABS
60.0000 mg | ORAL_TABLET | Freq: Once | ORAL | Status: AC
  Administered 2014-01-29: 60 mg via ORAL
  Filled 2014-01-29: qty 3

## 2014-01-29 MED ORDER — PREDNISONE 20 MG PO TABS: ORAL_TABLET | ORAL | Status: DC

## 2014-01-29 MED ORDER — MAGIC MOUTHWASH W/LIDOCAINE: 5.0000 mL | Freq: Three times a day (TID) | ORAL | Status: DC | PRN

## 2014-01-29 NOTE — ED Provider Notes (Signed)
CSN: [id number]     Arrival date & time 01/29/14  1451 History   None    Chief Complaint  Patient presents with  . Allergic Reaction   Patient is a 38 y.o. female presenting with allergic reaction. The history is provided by the patient. No language interpreter was used.  Allergic Reaction Presenting symptoms: difficulty swallowing, swelling and wheezing (chronic, ongoing, unchanged)   Presenting symptoms: no difficulty breathing, no itching and no rash   Severity:  Mild Prior allergic episodes:  No prior episodes Context: medications (doxycycline)   Relieved by:  None tried Worsened by:  Nothing tried Ineffective treatments:  None tried This chart was scribed for non-physician practitioner, Zacarias Pontes PA-C working with Dr. Lacretia Leigh MD, by Thea Alken, ED Scribe. This patient was seen in room WTR7/WTR7 and the patient's care was started at 2:57 PM.  Erica [name] is a 38 y.o. female with PMHx of breast cancer, asthma, GERD and HTN who presents to the Emergency Department complaining of an allergic reaction to doxycycline with trouble swallowing. Pt began taking doxycycline 2 days ago, prescribed by PCP for nasal drainage and chest congestion. Pt reports swelling to tongue and throat and dry mouth that began last night, as well as nausea. She states she's had a productive cough that she's had for 1 year, for which her PCP has prescribed her a zpac without relief. She is now taking doxycycline for this, as well as an inhaler and mucinex. She endorses some ongoing wheezing relieved fully by nebulizer breathing treatment, and that her chest congestion/cough has improved somewhat since starting doxycycline. She denies getting a CXR done at her PCP's. Pt denies fever, chills, HA, CP, SOB, DOE, lip swelling, rhinorrhea, trismus, drooling, stridor, abdominal pain, vomiting, diarrhea, constipation, myalgias, arthralgias, or paresthesias. Denies perioral tingling. She has not tried  anything for these symptoms.    Past Medical History  Diagnosis Date  . Breast cancer 2010  . Asthma   . Hypertension   . GERD (gastroesophageal reflux disease)   . Impaired glucose tolerance 04/13/2011  . Anemia, unspecified 04/13/2011  . Asthma 09/27/2010  . Bloating 03/18/2011  . 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 Lymphadenitis, chronic     restricted LEFT extremity   Past Surgical History  Procedure Laterality Date  . Hand surgery  2012    right; tendon release  . Breast lumpectomy  09/2008    left  . Carpal tunnel release Left 2011  . Tubal ligation  05/11/2011    Procedure: POST PARTUM TUBAL LIGATION;  Surgeon: Emeterio Reeve, MD;  Location: [facility];  Service: Gynecology;  Laterality: Bilateral;  Induced for HTN  . Lymph node dissection  2010    left breast; 2 wks after breast lumpectomy   Family History  Problem Relation Age of Onset  . Diabetes Maternal Grandmother   . Heart disease Maternal Grandmother   . Hypertension Maternal Grandmother   . Cancer Neg Hx   . Alcohol abuse Neg Hx   . Early death Neg Hx   . Hyperlipidemia Neg Hx   . Kidney disease Neg Hx   . Stroke Neg Hx   . Colon cancer Neg Hx    History  Substance Use Topics  . Smoking status: Never Smoker   . Smokeless tobacco: Never Used  . Alcohol Use: 0.0 oz/week     Comment: occasionally   OB History   Grav Para Term Preterm Abortions TAB SAB Ect Mult Living   5  4 4  1  1   4      Review of Systems  Constitutional: Negative for fever and chills.  HENT: Positive for trouble swallowing. Negative for drooling, facial swelling, rhinorrhea and sore throat.        +tongue and throat swelling  Respiratory: Positive for cough and wheezing (chronic, ongoing, unchanged). Negative for apnea, choking, chest tightness, shortness of breath and stridor.   Cardiovascular: Negative for chest pain.  Gastrointestinal: Positive for nausea. Negative for vomiting, abdominal pain, diarrhea, constipation and abdominal  distention.  Musculoskeletal: Negative for arthralgias and myalgias.  Skin: Negative for itching and rash.  Neurological: Negative for dizziness, syncope, weakness, light-headedness and headaches.  10 Systems reviewed and all are negative for acute change except as noted in the HPI.  Allergies  Aspirin; Penicillins; Kiwi extract; and Strawberry  Home Medications   Prior to Admission medications   Medication Sig Start Date End Date Taking? Authorizing Provider  acetaZOLAMIDE (DIAMOX) 250 MG tablet Take 2 tab in the morning, 1 tab in afternoon, 2 tab at bedtime. 01/18/14   Donika K Patel, DO  albuterol (PROVENTIL HFA;VENTOLIN HFA) 108 (90 BASE) MCG/ACT inhaler Inhale 2 puffs into the lungs every 4 (four) hours as needed for wheezing or shortness of breath (((PLAN A))). 01/26/14   Tammy S Parrett, NP  albuterol (PROVENTIL) (2.5 MG/3ML) 0.083% nebulizer solution Take 3 mLs (2.5 mg total) by nebulization every 4 (four) hours as needed for wheezing or shortness of breath (((PLAN B))). 01/26/14   Tammy S Parrett, NP  amLODipine-olmesartan (AZOR) 5-40 MG per tablet Take 2 tablets by mouth daily. 12/23/13   [name], MD  Biotin 1000 MCG tablet Take 1,000 mcg by mouth daily.    Historical Provider, MD  cloNIDine (CATAPRES) 0.3 MG tablet Take 1 tablet (0.3 mg total) by mouth 2 (two) times daily. 08/24/13   [name], MD  dextromethorphan-guaiFENesin [facility] DM) 30-600 MG per 12 hr tablet Take 1 tablet by mouth every 12 (twelve) hours as needed for cough.    Historical Provider, MD  diltiazem (CARDIZEM CD) 240 MG 24 hr capsule Take 1 capsule (240 mg total) by mouth daily. 11/30/13   [name], MD  doxycycline (VIBRA-TABS) 100 MG tablet Take 1 tablet (100 mg total) by mouth 2 (two) times daily. 01/26/14   Tammy S Parrett, NP  EPINEPHrine 0.3 mg/0.3 mL IJ SOAJ injection Inject 0.3 mLs (0.3 mg total) into the muscle once. 01/28/14   [name], MD  HYDROcodone-homatropine (HYDROMET)  5-1.5 MG/5ML syrup Take 5 mLs by mouth every 6 (six) hours as needed for cough. 01/26/14   Tammy S Parrett, NP  hyoscyamine (LEVSIN SL) 0.125 MG SL tablet Place 1 tablet (0.125 mg total) under the tongue every 8 (eight) hours as needed for cramping. 09/02/13   Jerene Bears, MD  isosorbide mononitrate (IMDUR) 30 MG 24 hr tablet Take 1 tablet (30 mg total) by mouth daily. 09/27/13   [name], MD  lidocaine (XYLOCAINE) 5 % ointment Apply with gloves, twice daily as needed to legs. 01/18/14   Donika K Patel, DO  LORazepam (ATIVAN) 0.5 MG tablet Take 0.5 mg by mouth every 8 (eight) hours as needed for anxiety. 01/07/13   Chauncey Cruel, MD  metoCLOPramide (REGLAN) 10 MG tablet Take 1 tablet (10 mg total) by mouth 4 (four) times daily -  with meals and at bedtime. 01/26/13   Jerene Bears, MD  mometasone (NASONEX) 50 MCG/ACT nasal  spray Place 2 sprays into the nose daily. 07/27/13   Julianne Rice, MD  mometasone-formoterol [facility]) 200-5 MCG/ACT AERO Inhale 2 puffs into the lungs 2 (two) times daily. 01/26/14   Tammy S Parrett, NP  montelukast (SINGULAIR) 10 MG tablet Take 1 tablet (10 mg total) by mouth at bedtime. 01/26/14   Tammy S Parrett, NP  naproxen (NAPROSYN) 500 MG tablet Take 1 tablet (500 mg total) by mouth 2 (two) times daily. 01/22/14   Courtney A Forcucci, PA-C  nebivolol (BYSTOLIC) 10 MG tablet Take 1 tablet (10 mg total) by mouth daily. 01/19/14   [name], MD  nitroGLYCERIN (NITROSTAT) 0.4 MG SL tablet Place 1 tablet (0.4 mg total) under the tongue every 5 (five) minutes as needed for chest pain. 08/24/13   [name], MD  ondansetron (ZOFRAN) 4 MG tablet Take 1 tablet (4 mg total) by mouth every 6 (six) hours. 10/22/13   Tanna Furry, MD  ondansetron (ZOFRAN-ODT) 4 MG disintegrating tablet Take 1 tablet (4 mg total) by mouth every 6 (six) hours as needed for nausea or vomiting. 01/28/14   [name], MD  pantoprazole (PROTONIX) 40 MG tablet Take 1 tablet (40 mg  total) by mouth 2 (two) times daily before a meal. 09/02/13   Jerene Bears, MD  potassium chloride (K-DUR) 10 MEQ tablet Take 1 tablet (10 mEq total) by mouth once. 01/28/14 01/28/15  [name], MD  predniSONE (DELTASONE) 10 MG tablet Take 4 tabs for 2 days, then 3 tabs for 2 days, 2 tabs for 2 days, then 1 tab for 2 days, then stop. 01/28/14   [name], MD  pregabalin (LYRICA) 200 MG capsule Take 1 capsule (200 mg total) by mouth 3 (three) times daily. 01/18/14   Donika K Patel, DO  sucralfate (CARAFATE) 1 G tablet Take 1 tablet (1 g total) by mouth 4 (four) times daily. 03/07/12   [name], MD  traMADol (ULTRAM) 50 MG tablet Take 50 mg by mouth every 4 (four) hours as needed (1-2 tabs as needed for cough or pain).    Historical Provider, MD  triamterene-hydrochlorothiazide (MAXZIDE-25) 37.5-25 MG per tablet Take 1 tablet by mouth every morning. 01/28/14   [name], MD   BP 157/81  Pulse 88  Temp(Src) 98.8 F (37.1 C) (Oral)  Resp 19  SpO2 95% Physical Exam  Nursing note and vitals reviewed. Constitutional: She is oriented to person, place, and time. Vital signs are normal. She appears well-developed and well-nourished. No distress.  Afebrile, nontoxic, NAD, VSS, SpO2 95% on RA. Speaking in full sentences, no drooling  HENT:  Head: Normocephalic and atraumatic.  Nose: Nose normal.  Mouth/Throat: Uvula is midline. Mucous membranes are dry. No trismus in the jaw. No uvula swelling.  Nose clear bilaterally Lips and tongue without swelling White film covering oral mucosa and tongue, which scrapes off. Dry mucous membranes. Posterior oropharynx without erythema or tonsillar exudates, airway patent, no drooling or trismus or uvular swelling, speaking in full sentences without any stridor or difficulty breathing  Eyes: Conjunctivae, EOM and lids are normal.  Neck: Normal range of motion and phonation normal. Neck supple.  Cardiovascular: Normal rate, regular rhythm, normal  heart sounds and intact distal pulses.  Exam reveals no gallop and no friction rub.   No murmur heard. Pulmonary/Chest: Effort normal. No stridor. No respiratory distress. She has no decreased breath sounds. She has no wheezes. She has no rhonchi. She has rales. She exhibits  no tenderness.  No increased WOB, SpO2 95% on RA, no distress noted. Speaking in full sentences. Airway patent. Mild rales in lower fields, no wheezes or rhonchi.   Abdominal: Normal appearance. She exhibits no distension.  Musculoskeletal: Normal range of motion.  Neurological: She is alert and oriented to person, place, and time. She has normal strength. No sensory deficit.  Skin: Skin is warm, dry and intact. No rash noted. No erythema.  No lip swelling or erythema. No rashes  Psychiatric: She has a normal mood and affect. Her behavior is normal.    ED Course  Procedures (including critical care time) DIAGNOSTIC STUDIES: Oxygen Saturation is 95% on RA, normal by my interpretation.    COORDINATION OF CARE: 3:08 PM- Pt advised of plan for treatment including and pt agrees.  Labs Review Labs Reviewed - No data to display  Imaging Review No results found.   EKG Interpretation None      MDM   Final diagnoses:  Allergic reaction caused by a drug  Oral thrush  Bronchitis    38y/o female with allergic reaction to doxycycline. Doesn't appear to be overt angioedema, but will treat as such given her presenting symptoms. Does appear to have white patches consistent with thrush, which could be source of her symptoms. Will treat for both with prednisone, benadryl, and zantac here as well as to go home with for 4 days of treatment. Pt felt improved after treatment today. Prednisone will help with possible asthma exacerbation that seems to be what her PCP was treating with inhaler and abx. Will switch her abx to levaquin. Doubt need for CXR given that pt is afebrile, nontoxic, and lung sounds are more consistent with  bronchitis vs PNA. Ambulated without any desaturations. Will give magic mouthwash and nystatin solution for thrush. Will have her f/up with PCP in 5-7 days for recheck. I explained the diagnosis and have given explicit precautions to return to the ER including for any other new or worsening symptoms. The patient understands and accepts the medical plan as it's been dictated and I have answered their questions. Discharge instructions concerning home care and prescriptions have been given. The patient is STABLE and is discharged to home in good condition.   I personally performed the services described in this documentation, which was scribed in my presence. The recorded information has been reviewed and is accurate.   BP 157/81  Pulse 88  Temp(Src) 98.8 F (37.1 C) (Oral)  Resp 19  SpO2 95%  Meds ordered this encounter  Medications  . diphenhydrAMINE (BENADRYL) capsule 50 mg    Sig:   . ranitidine (ZANTAC) 150 MG/10ML syrup 300 mg    Sig:   . predniSONE (DELTASONE) tablet 60 mg    Sig:   . diphenhydrAMINE (BENADRYL) 25 MG tablet    Sig: Take 2 tablets (50 mg total) by mouth every 6 (six) hours. x4 days    Dispense:  32 tablet    Refill:  0    Order Specific Question:  Supervising Provider    Answer:  Noemi Chapel D [[id number]]  . predniSONE (DELTASONE) 20 MG tablet    Sig: 3 tabs po daily x 4 days    Dispense:  12 tablet    Refill:  0    Order Specific Question:  Supervising Provider    Answer:  Noemi Chapel D [[id number]]  . ranitidine (ZANTAC) 150 MG tablet    Sig: Take 2 tablets (300 mg total) by mouth daily with breakfast.  x4 days    Dispense:  8 tablet    Refill:  0    Order Specific Question:  Supervising Provider    Answer:  Noemi Chapel D [[id number]]  . Alum & Mag Hydroxide-Simeth (MAGIC MOUTHWASH W/LIDOCAINE) SOLN    Sig: Take 5 mLs by mouth 3 (three) times daily as needed for mouth pain. Swish and spit.    Dispense:  60 mL    Refill:  0    Order Specific Question:  Supervising  Provider    Answer:  Noemi Chapel D [[id number]]  . nystatin (MYCOSTATIN) 100000 UNIT/ML suspension    Sig: Take 5 mLs (500,000 Units total) by mouth 4 (four) times daily. Swish and retain in mouth for as long as possible then spit out liquid. Use until symptoms have subsided for 2 full days.    Dispense:  180 mL    Refill:  0    Order Specific Question:  Supervising Provider    Answer:  Noemi Chapel D [[id number]]  . levofloxacin (LEVAQUIN) 750 MG tablet    Sig: Take 1 tablet (750 mg total) by mouth daily. X 5 days    Dispense:  5 tablet    Refill:  0    Order Specific Question:  Supervising Provider    Answer:  Johnna Acosta [address] [place], PA-C 01/29/14 1601

## 2014-01-29 NOTE — Discharge Instructions (Signed)
The doxycycline you were given could be causing symptoms of an allergic reaction. Take Prednisone, Benadryl, and Zantac as prescribed. Continue your usual home medications, except stop taking Doxycycline and take Levaquin instead, which should help with your bronchitis. Continue to take your other medications given for this issue, such as your inhalers and mucinex. Get plenty of rest and drink plenty of fluids. Avoid any known triggers. The symptoms you had today could be from thrush, a yeast infection of your mouth. Use magic mouthwash as directed as needed for mouth pain, and use the Nystatin solution as directed to clear the thrush infection. Please followup with your primary doctor in 5-7 days for recheck of your symptoms. Return to the ER for changes or worsening symptoms.   Angioedema Angioedema is sudden puffiness (swelling), often of the skin. It can happen:  On your face or privates (genitals).  In your belly (abdomen) or other body parts. It usually happens quickly and gets better in 1 or 2 days. It often starts at night and is found when you wake up. You may get red, itchy patches of skin (hives). Attacks can be dangerous if your breathing passages get puffy. The condition may happen only once, or it can come back at random times. It may happen for several years before it goes away for good. HOME CARE  Only take medicines as told by your doctor.  Always carry your emergency allergy medicines with you.  Wear a medical bracelet as told by your doctor.  Avoid things that you know will cause attacks (triggers). GET HELP IF:  You have another attack.  Your attacks happen more often or get worse.  The condition was passed to you by your parents and you want to have children. GET HELP RIGHT AWAY IF:   Your mouth, tongue, or lips are very puffy.  You have trouble breathing.  You have trouble swallowing.  You pass out (faint). MAKE SURE YOU:   Understand these  instructions.  Will watch your condition.  Will get help right away if you are not doing well or get worse. Document Released: 03/06/2009 Document Revised: 01/06/2013 Document Reviewed: 11/09/2012 [facility] Patient Information 2015 [place], [place]. This information is not intended to replace advice given to you by your health care provider. Make sure you discuss any questions you have with your health care provider.  Drug Allergy A drug allergy means you have a strange reaction to a medicine. You may have puffiness (swelling), itching, red rashes, and hives. Some allergic reactions can be life-threatening. HOME CARE  If you do not know what caused your reaction:  Write down medicines you use.  Write down any problems you have after using medicine.  Avoid things that cause a reaction.  You can see an allergy doctor to be tested for allergies. If you have hives or a rash:  Take medicine as told by your doctor.  Place cold cloths on your skin.  Do not take hot baths or hot showers. Take baths in cool water. If you are severely allergic:  Wear a medical bracelet or necklace that lists your allergy.  Carry your allergy kit or medicine shot to treat severe allergic reactions with you. These can save your life.  Do not drive until medicine from your shot has worn off, unless your doctor says it is okay. GET HELP RIGHT AWAY IF:   Your mouth is puffy, or you have trouble breathing.  You have a tight feeling in your chest or throat.  You have hives, puffiness, or itching all over your body.  You throw up (vomit) or have watery poop (diarrhea).  You feel dizzy or pass out (faint).  You think you are having a reaction. Problems often start within 30 minutes after taking a medicine.  You are getting worse, not better.  You have new problems.  Your problems go away and then come back. This is an emergency. Use your medicine shot or allergy kit as told. Call yourlocal emergency  services (911 in [place] after the shot. Even if you feel better after the shot, you need to go to the hospital. You may need more medicine to control a severe reaction. MAKE SURE YOU:  Understand these instructions.  Will watch your condition.  Will get help right away if you are not doing well or get worse. Document Released: 04/25/2004 Document Revised: 06/10/2011 Document Reviewed: 09/13/2010 [facility] Patient Information 2015 [place], [place]. This information is not intended to replace advice given to you by your health care provider. Make sure you discuss any questions you have with your health care provider.  Thrush, Adult  Ritta Slot is an infection that can happen on the mouth, throat, tongue, or other areas. It causes white patches to form on the mouth and tongue. HOME CARE  Only take medicine as told by your doctor. You may be given medicine to swallow or to apply right on the area.  Eat plain yogurt that contains live cultures (check the label).  Rinse your mouth many times a day with a warm saltwater rinse. To make the rinse, mix 1 teaspoon (6 g) of salt in 8 ounces (0.2 L) of warm water. To reduce pain:  Drink cold liquids such as water or iced tea.  Eat frozen ice pops or frozen juices.  Eat foods that are easy to swallow, such as gelatin or ice cream.  Drink from a straw if the patches are painful. If you are breastfeeding:  Clean your nipples with an antifungal medicine.  Dry your nipples after breastfeeding.  Use an ointment called lanolin to help relieve nipple soreness. If you wear dentures:  Take out your dentures before going to bed.  Brush them thoroughly.  Soak them in a denture cleaner. GET HELP IF:   Your problems are getting worse.  Your problems are not improving within 7 days of starting treatment.  Your infection is spreading. This may show as white patches on the skin outside of your mouth.  You are nursing and have redness and pain in the  nipples. MAKE SURE YOU:  Understand these instructions.  Will watch your condition.  Will get help right away if you are not doing well or get worse. Document Released: 06/12/2009 Document Revised: 01/06/2013 Document Reviewed: 10/19/2012 [facility] Patient Information 2015 [place], [place]. This information is not intended to replace advice given to you by your health care provider. Make sure you discuss any questions you have with your health care provider.  Upper Respiratory Infection, Adult An upper respiratory infection (URI) is also known as the common cold. It is often caused by a type of germ (virus). Colds are easily spread (contagious). You can pass it to others by kissing, coughing, sneezing, or drinking out of the same glass. Usually, you get better in 1 or 2 weeks.  HOME CARE   Only take medicine as told by your doctor.  Use a warm mist humidifier or breathe in steam from a hot shower.  Drink enough water and fluids to keep  your pee (urine) clear or pale yellow.  Get plenty of rest.  Return to work when your temperature is back to normal or as told by your doctor. You may use a face mask and wash your hands to stop your cold from spreading. GET HELP RIGHT AWAY IF:   After the first few days, you feel you are getting worse.  You have questions about your medicine.  You have chills, shortness of breath, or brown or red spit (mucus).  You have yellow or brown snot (nasal discharge) or pain in the face, especially when you bend forward.  You have a fever, puffy (swollen) neck, pain when you swallow, or white spots in the back of your throat.  You have a bad headache, ear pain, sinus pain, or chest pain.  You have a high-pitched whistling sound when you breathe in and out (wheezing).  You have a lasting cough or cough up blood.  You have sore muscles or a stiff neck. MAKE SURE YOU:   Understand these instructions.  Will watch your condition.  Will get help right  away if you are not doing well or get worse. Document Released: 09/04/2007 Document Revised: 06/10/2011 Document Reviewed: 06/23/2013 [facility] Patient Information 2015 [place], [place]. This information is not intended to replace advice given to you by your health care provider. Make sure you discuss any questions you have with your health care provider.

## 2014-01-29 NOTE — ED Notes (Signed)
Per patient states she has been on doxycycline for 2 days-states tongue got heavy last night-no respiratory distress, no dysphasia

## 2014-01-29 NOTE — ED Notes (Signed)
Pt ambulated in hallway with O2 sats at 97%. Pt tolerated well. No acute distress.

## 2014-01-30 ENCOUNTER — Encounter: Payer: Self-pay | Admitting: Internal Medicine

## 2014-01-30 MED ORDER — TRAMADOL HCL 50 MG PO TABS: 50.0000 mg | ORAL_TABLET | ORAL | Status: DC | PRN

## 2014-01-30 MED ORDER — LORAZEPAM 0.5 MG PO TABS: 0.5000 mg | ORAL_TABLET | Freq: Three times a day (TID) | ORAL | Status: DC | PRN

## 2014-01-30 NOTE — Assessment & Plan Note (Signed)
Her BP is very well controlled but her creatinine has gone up and her CO2 is down slightly I have asked her to help the Maxzide and nsaids for now I have asked her to return to recheck the labs within the next few days

## 2014-01-30 NOTE — Assessment & Plan Note (Signed)
I have asked her to see pain management for further evaluation of this

## 2014-01-31 ENCOUNTER — Encounter (HOSPITAL_COMMUNITY)
Admission: RE | Admit: 2014-01-31 | Discharge: 2014-01-31 | Disposition: A | Discharge status: Home / Self Care | Payer: Medicaid - Out of State | Source: Ambulatory Visit | Attending: Cardiology | Admitting: Cardiology

## 2014-01-31 ENCOUNTER — Telehealth: Payer: Self-pay | Admitting: Internal Medicine

## 2014-01-31 ENCOUNTER — Encounter: Payer: Self-pay | Admitting: Nurse Practitioner

## 2014-01-31 ENCOUNTER — Ambulatory Visit (HOSPITAL_BASED_OUTPATIENT_CLINIC_OR_DEPARTMENT_OTHER): Payer: Medicaid - Out of State | Admitting: Nurse Practitioner

## 2014-01-31 ENCOUNTER — Other Ambulatory Visit (HOSPITAL_BASED_OUTPATIENT_CLINIC_OR_DEPARTMENT_OTHER): Payer: Medicaid - Out of State

## 2014-01-31 ENCOUNTER — Ambulatory Visit: Payer: Medicaid - Out of State | Admitting: Cardiology

## 2014-01-31 ENCOUNTER — Other Ambulatory Visit: Payer: Self-pay | Admitting: Geriatric Medicine

## 2014-01-31 VITALS — BP 134/81 | HR 78 | Temp 98.5°F | Resp 18 | Ht 66.0 in | Wt 203.4 lb

## 2014-01-31 DIAGNOSIS — I5030 Unspecified diastolic (congestive) heart failure: Secondary | ICD-10-CM | POA: Diagnosis not present

## 2014-01-31 DIAGNOSIS — Z5189 Encounter for other specified aftercare: Secondary | ICD-10-CM | POA: Diagnosis not present

## 2014-01-31 DIAGNOSIS — C50412 Malignant neoplasm of upper-outer quadrant of left female breast: Secondary | ICD-10-CM

## 2014-01-31 DIAGNOSIS — F418 Other specified anxiety disorders: Secondary | ICD-10-CM

## 2014-01-31 DIAGNOSIS — I10 Essential (primary) hypertension: Secondary | ICD-10-CM

## 2014-01-31 DIAGNOSIS — I429 Cardiomyopathy, unspecified: Secondary | ICD-10-CM | POA: Insufficient documentation

## 2014-01-31 DIAGNOSIS — E876 Hypokalemia: Secondary | ICD-10-CM

## 2014-01-31 LAB — CBC WITH DIFFERENTIAL/PLATELET
BASO%: 0.2 % (ref 0.0–2.0)
Basophils Absolute: 0 10*3/uL (ref 0.0–0.1)
EOS ABS: 0 10*3/uL (ref 0.0–0.5)
EOS%: 0 % (ref 0.0–7.0)
HCT: 37.5 % (ref 34.8–46.6)
HGB: 12 g/dL (ref 11.6–15.9)
LYMPH%: 10.3 % — ABNORMAL LOW (ref 14.0–49.7)
MCH: 28.5 pg (ref 25.1–34.0)
MCHC: 31.9 g/dL (ref 31.5–36.0)
MCV: 89.4 fL (ref 79.5–101.0)
MONO#: 0.5 10*3/uL (ref 0.1–0.9)
MONO%: 4.1 % (ref 0.0–14.0)
NEUT#: 9.6 10*3/uL — ABNORMAL HIGH (ref 1.5–6.5)
NEUT%: 85.4 % — ABNORMAL HIGH (ref 38.4–76.8)
Platelets: 228 10*3/uL (ref 145–400)
RBC: 4.2 10*6/uL (ref 3.70–5.45)
RDW: 14.9 % — ABNORMAL HIGH (ref 11.2–14.5)
WBC: 11.3 10*3/uL — ABNORMAL HIGH (ref 3.9–10.3)
lymph#: 1.2 10*3/uL (ref 0.9–3.3)

## 2014-01-31 LAB — COMPREHENSIVE METABOLIC PANEL (CC13)
ALBUMIN: 3.6 g/dL (ref 3.5–5.0)
ALT: 29 U/L (ref 0–55)
AST: 14 U/L (ref 5–34)
Alkaline Phosphatase: 53 U/L (ref 40–150)
Anion Gap: 8 mEq/L (ref 3–11)
BUN: 19.7 mg/dL (ref 7.0–26.0)
CALCIUM: 9.3 mg/dL (ref 8.4–10.4)
CHLORIDE: 111 meq/L — AB (ref 98–109)
CO2: 19 mEq/L — ABNORMAL LOW (ref 22–29)
Creatinine: 1.3 mg/dL — ABNORMAL HIGH (ref 0.6–1.1)
Glucose: 98 mg/dl (ref 70–140)
POTASSIUM: 3.4 meq/L — AB (ref 3.5–5.1)
Sodium: 138 mEq/L (ref 136–145)
Total Bilirubin: 0.32 mg/dL (ref 0.20–1.20)
Total Protein: 7.6 g/dL (ref 6.4–8.3)

## 2014-01-31 MED ORDER — LORAZEPAM 0.5 MG PO TABS: 0.5000 mg | ORAL_TABLET | Freq: Three times a day (TID) | ORAL | Status: DC | PRN

## 2014-01-31 MED ORDER — LORAZEPAM 0.5 MG PO TABS
0.5000 mg | ORAL_TABLET | Freq: Three times a day (TID) | ORAL | Status: DC | PRN
Start: 2014-01-31 — End: 2014-08-31

## 2014-01-31 NOTE — Progress Notes (Signed)
Erica [name] 38 y.o. female Nutrition Note Spoke with pt.  Nutrition Plan and Nutrition Survey goals reviewed with pt. Pt is following Step 2 of the Therapeutic Lifestyle Changes diet. Pt wants to lose wt. Barriers to wt loss include the use of prednisone and frequent nausea/ decreased appetite due to gastroparesis. Pt has not been trying to lose wt per discussion. Wt loss tips reviewed. Pt states she is watching the sodium in her diet due to CHF and denies being on a fluid restriction at this time. Pt expressed understanding of the information reviewed. Pt aware of nutrition education classes offered.  Nutrition Diagnosis ? Food-and nutrition-related knowledge deficit related to lack of exposure to information as related to diagnosis of: ? CVD  ? Obesity related to excessive energy intake as evidenced by a BMI of 35.8  Nutrition RX/ Estimated Daily Nutrition Needs for: wt loss  1300-1800 Kcal, 35-45 gm fat, 8-13 gm sat fat, 1.2-1.8 gm trans-fat, <1500 mg sodium  Nutrition Intervention ? Pt's individual nutrition plan reviewed with pt. ? Benefits of adopting Therapeutic Lifestyle Changes discussed when Medficts reviewed. ? Pt to attend the Portion Distortion class ? Pt to attend the  ? Diabetes Q & A class ? Pt given handouts for: ? Nutrition I class ? Nutrition II class ? Diabetes Blitz class ? Continue client-centered nutrition education by RD, as part of interdisciplinary care. Goal(s) ? Pt to identify food quantities necessary to achieve: ? wt loss to a goal wt of 184-202 lb (83.8-92.0 kg) at graduation from cardiac rehab.  ? Pt to describe the benefit of including fruits, vegetables, whole grains, and low-fat dairy products in a heart healthy meal plan. Monitor and Evaluate progress toward nutrition goal with team. Nutrition Risk: Change to Moderate Derek Mound, M.Ed, RD, LDN, CDE 01/31/2014 3:18 PM

## 2014-01-31 NOTE — Progress Notes (Signed)
ID: Erica [name]   DOB: 26-Nov-1975  MR#: [id number]  ERX#:[id number]  PJK:[id number] [name], MD GYN: SU:  Erroll Luna, MD Other:  Christinia Gully, MD;  Zenovia Jarred, MD;  Ena Dawley, MD  CHIEF COMPLAINT:  Hx of Left  Breast Cancer    BREAST CANCER HISTORY: Erica [name] noticed a lump in her left breast sometime in August or September 2009.  Initially this was thought to possibly be "dry breast milk" but as the mass got bigger, she brought it to her physician's attention and was set up for mammography.  This was performed May 20, 2008 at [facility] and it showed a palpable 8.5 cm oval circumscribed mass in the left breast upper outer quadrant.  There were prominent left axillary lymph nodes noted as well.  An ultrasound showed this to be hypoechoic, with lobulated margins and probable central necrosis measuring up to 7.3 cm on this modality.  Ultrasound of the left axilla demonstrated numerous prominent lymph nodes at the upper limit of normal, the largest measuring 1 cm with a normal appearing thin cortex and a normal cortex to halo ratio.    Because the patient had no insurance, she was referred to [facility] and biopsy was performed in [place] with the pathology there ([id number]) showing a poorly differentiated malignancy which was triple negative (ER 1%, PR 0%, HER-2/neu 0).  The axillary lymph nodes were not biopsied. With this information, the patient was presented at the Breast Multidisciplinary Conference and the feeling was that, after appropriate staging, the patient would be a good candidate for neoadjuvant treatment and possibly NSABP B40.    Her subsequent history is as detailed below  INTERVAL HISTORY: Erica [name] returns alone today for followup of her left breast cancer. She is "single now". She continues to live partially with her mother in [place], [place] (near [place]) and partially here. She is still maintaining all her medical care here in [place], however. Her family is  doing well. Her youngest son is 36 years old, and is healthy.  [name] had an ED visit last week secondary to an allergic reaction she had to doxycyline, prescribed to her for an asthma exacerbation. She has since switched to levaquin and is tolerating this well. She continues to with cardiac rehab and complains of palpitations and dizzy spells. Frady continues to have problems with poorly controlled asthma and hypertension. She is followed regularly by Dr. Melvyn Novas, Dr. Meda Coffee, and Dr. [name]. 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 She has bilateral hand tremors that keep her from working and she is in the process of filing for disability.   REVIEW OF SYSTEMS: Erica [name] denies fevers or chills. She has delayed gastric emptying and thus is frequently nauseous, but has regular bowel movements. She takes reglan with every meal. She has no changes in urinary habits. She denies pain. Her depression and anxiety seem to be getting worse, and she cries regularly. She is only on 0.21m lorazepam q8h PRN, but this is not always helpful. A detailed review of systems is otherwise noncontributory.   PAST MEDICAL HISTORY: Past Medical History  Diagnosis Date  . Breast cancer 2010  . Asthma   . Hypertension   . GERD (gastroesophageal reflux disease)   . Impaired glucose tolerance 40/03/2012  . Anemia, unspecified 04/13/2011  . Asthma 09/27/2010  . Bloating 03/18/2011  . 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 Lymphadenitis, chronic     restricted LEFT extremity    PAST SURGICAL  HISTORY: Past Surgical History  Procedure Laterality Date  . Hand surgery  2012    right; tendon release  . Breast lumpectomy  09/2008    left  . Carpal tunnel release Left 2011  . Tubal ligation  05/11/2011    Procedure: POST PARTUM TUBAL LIGATION;  Surgeon: Emeterio Reeve, MD;  Location: [facility];  Service: Gynecology;  Laterality: Bilateral;  Induced for HTN  . Lymph node dissection  2010    left  breast; 2 wks after breast lumpectomy    FAMILY HISTORY Family History  Problem Relation Age of Onset  . Diabetes Maternal Grandmother   . Heart disease Maternal Grandmother   . Hypertension Maternal Grandmother   . Cancer Neg Hx   . Alcohol abuse Neg Hx   . Early death Neg Hx   . Hyperlipidemia Neg Hx   . Kidney disease Neg Hx   . Stroke Neg Hx   . Colon cancer Neg Hx   The patient's parents are alive. The patient has one brother and one sister.  There is no ovarian or breast cancer in the immediate family, but the patient's maternal grandmother, who was one of 49 sisters, had one sister with breast cancer diagnosed in her 40.    GYNECOLOGIC HISTORY: updated January 2015 The patient is GX P4, first pregnancy to term at age 13.  She stopped having periods at the time of chemotherapy, and had had no periods for  2 years, then became pregnant in 2012. This was a surprise to her, and she was not aware of the pregnancy until the seventh month. She has not resumed normal menses, but has had some "spotting" on and off for the last several months.  SOCIAL HISTORY: updated  January 2015  She has been a Dance movement psychotherapist in the past. She's currently disabled. she splits her time between [place] and [place], [place]. She's currently single. Erica [name] has four children,  currently 40 year old, 71 years old, 24 and 40 years old. Her oldest daughter is attending college in [place].   Her youngest son is almost 40. 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 Smoking status: Never Smoker   . Smokeless tobacco: Never Used  . Alcohol Use: 0.0 oz/week     Comment: occasionally     Colonoscopy: Never  PAP: June 2013  Bone density:  Never  Lipid panel: Nov 2014, Dr. Meda Coffee   Allergies  Allergen Reactions  . Aspirin Shortness Of Breath and Palpitations    Pt can take ibuprofen without reaction  .  Doxycycline Anaphylaxis  . Penicillins Shortness Of Breath and Palpitations  . Kiwi Extract Itching and Swelling    Lip swelling  . Strawberry Hives    Current Outpatient Prescriptions  Medication Sig Dispense Refill  . acetaZOLAMIDE (DIAMOX) 250 MG tablet Take 2 tab in the morning, 1 tab in afternoon, 2 tab at bedtime. 150 tablet 5  . albuterol (PROVENTIL HFA;VENTOLIN HFA) 108 (90 BASE) MCG/ACT inhaler Inhale 2 puffs into the lungs every 4 (four) hours as needed for wheezing or shortness of breath (((PLAN A))). 18 g 5  . albuterol (PROVENTIL) (2.5 MG/3ML) 0.083% nebulizer solution Take 3 mLs (2.5 mg total) by nebulization every 4 (four) hours as needed for wheezing or shortness of breath (((PLAN B))). 300 mL 5  . Alum & Mag Hydroxide-Simeth (MAGIC MOUTHWASH W/LIDOCAINE) SOLN Take 5 mLs by mouth 3 (  three) times daily as needed for mouth pain. Swish and spit. 60 mL 0  . amLODipine-olmesartan (AZOR) 5-40 MG per tablet Take 2 tablets by mouth daily. 30 tablet 3  . Biotin 1000 MCG tablet Take 1,000 mcg by mouth daily.    . cloNIDine (CATAPRES) 0.3 MG tablet Take 1 tablet (0.3 mg total) by mouth 2 (two) times daily. 90 tablet 3  . dextromethorphan-guaiFENesin (MUCINEX DM) 30-600 MG per 12 hr tablet Take 1 tablet by mouth every 12 (twelve) hours as needed for cough.    . diltiazem (CARDIZEM CD) 240 MG 24 hr capsule Take 1 capsule (240 mg total) by mouth daily. 90 capsule 4  . diphenhydrAMINE (BENADRYL) 25 MG tablet Take 2 tablets (50 mg total) by mouth every 6 (six) hours. x4 days 32 tablet 0  . HYDROcodone-homatropine (HYDROMET) 5-1.5 MG/5ML syrup Take 5 mLs by mouth every 6 (six) hours as needed for cough. 240 mL 0  . isosorbide mononitrate (IMDUR) 30 MG 24 hr tablet Take 1 tablet (30 mg total) by mouth daily. 90 tablet 3  . levofloxacin (LEVAQUIN) 750 MG tablet Take 1 tablet (750 mg total) by mouth daily. X 5 days 5 tablet 0  . lidocaine (XYLOCAINE) 5 % ointment Apply with gloves, twice daily as  needed to legs. 50 g 3  . LORazepam (ATIVAN) 0.5 MG tablet Take 1 tablet (0.5 mg total) by mouth every 8 (eight) hours as needed for anxiety. 60 tablet 2  . metoCLOPramide (REGLAN) 10 MG tablet Take 1 tablet (10 mg total) by mouth 4 (four) times daily -  with meals and at bedtime. 120 tablet 3  . mometasone (NASONEX) 50 MCG/ACT nasal spray Place 2 sprays into the nose daily. 17 g 12  . mometasone-formoterol (DULERA) 200-5 MCG/ACT AERO Inhale 2 puffs into the lungs 2 (two) times daily. 13 g 5  . montelukast (SINGULAIR) 10 MG tablet Take 1 tablet (10 mg total) by mouth at bedtime. 30 tablet 5  . naproxen (NAPROSYN) 500 MG tablet Take 1 tablet (500 mg total) by mouth 2 (two) times daily. 30 tablet 0  . nebivolol (BYSTOLIC) 10 MG tablet Take 1 tablet (10 mg total) by mouth daily. 30 tablet 8  . nitroGLYCERIN (NITROSTAT) 0.4 MG SL tablet Place 1 tablet (0.4 mg total) under the tongue every 5 (five) minutes as needed for chest pain. 90 tablet 3  . nystatin (MYCOSTATIN) 100000 UNIT/ML suspension Take 5 mLs (500,000 Units total) by mouth 4 (four) times daily. Swish and retain in mouth for as long as possible then spit out liquid. Use until symptoms have subsided for 2 full days. 180 mL 0  . ondansetron (ZOFRAN-ODT) 4 MG disintegrating tablet Take 1 tablet (4 mg total) by mouth every 6 (six) hours as needed for nausea or vomiting. 20 tablet 5  . potassium chloride (K-DUR) 10 MEQ tablet Take 1 tablet (10 mEq total) by mouth once. 90 tablet 1  . predniSONE (DELTASONE) 20 MG tablet 3 tabs po daily x 4 days 12 tablet 0  . pregabalin (LYRICA) 200 MG capsule Take 1 capsule (200 mg total) by mouth 3 (three) times daily. 90 capsule 5  . ranitidine (ZANTAC) 150 MG tablet Take 2 tablets (300 mg total) by mouth daily with breakfast. x4 days 8 tablet 0  . traMADol (ULTRAM) 50 MG tablet Take 1 tablet (50 mg total) by mouth every 4 (four) hours as needed (1-2 tabs as needed for cough or pain). 30 tablet 1  . EPINEPHrine  0.3 mg/0.3 mL IJ SOAJ injection Inject 0.3 mLs (0.3 mg total) into the muscle once. 2 Device 1  . hyoscyamine (LEVSIN SL) 0.125 MG SL tablet Place 1 tablet (0.125 mg total) under the tongue every 8 (eight) hours as needed for cramping. 30 tablet 3  . ondansetron (ZOFRAN) 4 MG tablet Take 1 tablet (4 mg total) by mouth every 6 (six) hours. 12 tablet 0  . pantoprazole (PROTONIX) 40 MG tablet Take 1 tablet (40 mg total) by mouth 2 (two) times daily before a meal. 60 tablet 3  . sucralfate (CARAFATE) 1 G tablet Take 1 tablet (1 g total) by mouth 4 (four) times daily. 30 tablet 0   No current facility-administered medications for this visit.    OBJECTIVE:   Filed Vitals:   01/31/14 1538  BP: 134/81  Pulse: 78  Temp: 98.5 F (36.9 C)  Resp: 18     Body mass index is 32.85 kg/(m^2).    ECOG FS: 2 Filed Weights   01/31/14 1538  Weight: 203 lb 6.4 oz (92.262 kg)  Young [place] American female in no acute distress  Physical Exam: Skin: warm, dry  HEENT: sclerae anicteric, conjunctivae pink, oropharynx clear. No thrush or mucositis.  Lymph Nodes: No cervical or supraclavicular lymphadenopathy  Lungs: clear to auscultation bilaterally, no rales, wheezes, or rhonci  Heart: regular rate and rhythm  Abdomen: round, soft, non tender, positive bowel sounds  Musculoskeletal: No focal spinal tenderness, no peripheral edema  Neuro: non focal, well oriented, positive affect  Breasts: left breast status post lumpectomy and radiation. No evidence of recurrence disease. Left axilla benign. right breast unremarkable.    LAB RESULTS: Lab Results  Component Value Date   WBC 11.3* 01/31/2014   NEUTROABS 9.6* 01/31/2014   HGB 12.0 01/31/2014   HCT 37.5 01/31/2014   MCV 89.4 01/31/2014   PLT 228 01/31/2014      Chemistry      Component Value Date/Time   NA 138 01/31/2014 1524   NA 139 01/28/2014 1044   K 3.4* 01/31/2014 1524   K 3.9 01/28/2014 1044   CL 112 01/28/2014 1044   CL 103 09/10/2012  1618   CO2 19* 01/31/2014 1524   CO2 18* 01/28/2014 1044   BUN 19.7 01/31/2014 1524   BUN 18 01/28/2014 1044   CREATININE 1.3* 01/31/2014 1524   CREATININE 1.5* 01/28/2014 1044   CREATININE 0.64 05/02/2011 1021      Component Value Date/Time   CALCIUM 9.3 01/31/2014 1524   CALCIUM 8.9 01/28/2014 1044   ALKPHOS 53 01/31/2014 1524   ALKPHOS 43 09/15/2013 0702   AST 14 01/31/2014 1524   AST 24 09/15/2013 0702   ALT 29 01/31/2014 1524   ALT 32 09/15/2013 0702   BILITOT 0.32 01/31/2014 1524   BILITOT <0.2* 09/15/2013 0702        STUDIES:  Most recent bilateral mammogram on 02/05/2013 was unremarkable.   ASSESSMENT: 38 y.o. [place] woman with history of locally advanced left breast carcinoma initially presenting in March 2010   (1) treated neoadjuvantly with 4 cycles of docetaxel/ cyclophosphamide.    (2) status post left lumpectomy and sentinel lymph node dissection in July 2010 for a ypT2 ypN1, stage IIB invasive ductal carcinoma, grade 3, triple negative with MIB-1 of 88%.   (3) status post full axillary dissection and margin clearance in August 2010.   (4) status post 11 doses of weekly paclitaxel completed in December 2010   (5) radiation therapy completed in March  2011.  Now followed with observation alone.   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 It would be helpful to her if she were placed on an anti-depressant in addition to the lorazepam PRN to improve her mood. The traditional SSRIs are incompatible with her extensive medication list, mainly the reglan. Reglan in conjuction with an SSRI increases the risk of extra pyramidal side effects and the risk of serotonin syndrome symptoms. Vikki agreed that it was best she see a psychiatrist for her depression who could manage a more  effective pharmacological intervention. Dr. [name] suggested Dr. Lynder Parents and I have written a referral during this visit for her to see him. In the meantime I have refilled her PRN lorazepam prescription.   The labs were reviewed in detail and she is hypokalemic. I am increase her potassium dose to 38mq BID for the next 3 days.   Otherwise, as far as her breast cancer is concerned, TChrystinais doing well. Her mammogram is due next month. She will continue to work with cardiac rehab. She will return to clinic in early January of next year. She understands and agrees with this plan. She knows the goal of treatment in her case is cure. She has been encouraged to call with any issues that might arise before her next visit here.   FMarcelino Duster NP    01/31/2014

## 2014-01-31 NOTE — Telephone Encounter (Signed)
emmi emailed °

## 2014-02-01 ENCOUNTER — Telehealth: Payer: Self-pay | Admitting: *Deleted

## 2014-02-01 NOTE — Telephone Encounter (Signed)
Called patient and she said that Dr. [name] gave her the letter and placard.

## 2014-02-02 ENCOUNTER — Ambulatory Visit (INDEPENDENT_AMBULATORY_CARE_PROVIDER_SITE_OTHER): Payer: Self-pay

## 2014-02-02 ENCOUNTER — Encounter (HOSPITAL_COMMUNITY)
Admission: RE | Admit: 2014-02-02 | Discharge: 2014-02-02 | Disposition: A | Discharge status: Home / Self Care | Payer: Medicaid - Out of State | Source: Ambulatory Visit | Attending: Cardiology | Admitting: Cardiology

## 2014-02-02 ENCOUNTER — Telehealth: Payer: Self-pay | Admitting: Internal Medicine

## 2014-02-02 DIAGNOSIS — Z5189 Encounter for other specified aftercare: Secondary | ICD-10-CM | POA: Diagnosis not present

## 2014-02-02 DIAGNOSIS — J45901 Unspecified asthma with (acute) exacerbation: Secondary | ICD-10-CM

## 2014-02-02 DIAGNOSIS — Z23 Encounter for immunization: Secondary | ICD-10-CM

## 2014-02-02 NOTE — Telephone Encounter (Signed)
Called spoke with pt. She needs neb supplies. I have sent order to [facility]. Nothing further needed

## 2014-02-03 ENCOUNTER — Ambulatory Visit (INDEPENDENT_AMBULATORY_CARE_PROVIDER_SITE_OTHER): Payer: Medicaid - Out of State

## 2014-02-03 ENCOUNTER — Encounter: Payer: Self-pay | Admitting: Internal Medicine

## 2014-02-03 ENCOUNTER — Ambulatory Visit (INDEPENDENT_AMBULATORY_CARE_PROVIDER_SITE_OTHER): Payer: Medicaid - Out of State | Admitting: Internal Medicine

## 2014-02-03 ENCOUNTER — Encounter: Payer: Self-pay | Admitting: Cardiology

## 2014-02-03 ENCOUNTER — Encounter: Payer: Self-pay | Admitting: Nurse Practitioner

## 2014-02-03 ENCOUNTER — Other Ambulatory Visit (INDEPENDENT_AMBULATORY_CARE_PROVIDER_SITE_OTHER): Payer: Medicaid - Out of State

## 2014-02-03 VITALS — BP 130/70 | HR 80 | Temp 98.4°F | Ht 66.0 in | Wt 200.0 lb

## 2014-02-03 DIAGNOSIS — A5901 Trichomonal vulvovaginitis: Secondary | ICD-10-CM

## 2014-02-03 DIAGNOSIS — I10 Essential (primary) hypertension: Secondary | ICD-10-CM

## 2014-02-03 DIAGNOSIS — E876 Hypokalemia: Secondary | ICD-10-CM

## 2014-02-03 DIAGNOSIS — A5900 Urogenital trichomoniasis, unspecified: Secondary | ICD-10-CM

## 2014-02-03 DIAGNOSIS — Z0000 Encounter for general adult medical examination without abnormal findings: Secondary | ICD-10-CM

## 2014-02-03 DIAGNOSIS — Z23 Encounter for immunization: Secondary | ICD-10-CM

## 2014-02-03 DIAGNOSIS — N179 Acute kidney failure, unspecified: Secondary | ICD-10-CM | POA: Insufficient documentation

## 2014-02-03 LAB — BASIC METABOLIC PANEL
BUN: 29 mg/dL — ABNORMAL HIGH (ref 6–23)
CO2: 24 mEq/L (ref 19–32)
Calcium: 8.9 mg/dL (ref 8.4–10.5)
Chloride: 107 mEq/L (ref 96–112)
Creatinine, Ser: 1.5 mg/dL — ABNORMAL HIGH (ref 0.4–1.2)
GFR: 47.89 mL/min — ABNORMAL LOW (ref >60.00)
Glucose, Bld: 87 mg/dL (ref 70–99)
POTASSIUM: 2.9 meq/L — AB (ref 3.5–5.1)
SODIUM: 136 meq/L (ref 135–145)

## 2014-02-03 LAB — LIPID PANEL
Cholesterol: 172 mg/dL (ref 0–200)
HDL: 32 mg/dL — ABNORMAL LOW (ref >39.00)
LDL Cholesterol: 110 mg/dL — ABNORMAL HIGH (ref 0–99)
NonHDL: 140
Total CHOL/HDL Ratio: 5
Triglycerides: 151 mg/dL — ABNORMAL HIGH (ref 0.0–149.0)
VLDL: 30.2 mg/dL (ref 0.0–40.0)

## 2014-02-03 LAB — URINALYSIS, ROUTINE W REFLEX MICROSCOPIC
Bilirubin Urine: NEGATIVE
Ketones, ur: NEGATIVE
NITRITE: NEGATIVE
PH: 6 (ref 5.0–8.0)
RBC / HPF: NONE SEEN (ref 0–?)
Specific Gravity, Urine: 1.01 (ref 1.000–1.030)
Total Protein, Urine: NEGATIVE
UROBILINOGEN UA: 0.2 (ref 0.0–1.0)
Urine Glucose: NEGATIVE

## 2014-02-03 NOTE — Progress Notes (Signed)
Pre visit review using our clinic review tool, if applicable. No additional management support is needed unless otherwise documented below in the visit note. 

## 2014-02-03 NOTE — Patient Instructions (Signed)

## 2014-02-04 ENCOUNTER — Encounter (HOSPITAL_COMMUNITY)
Admission: RE | Admit: 2014-02-04 | Discharge: 2014-02-04 | Disposition: A | Discharge status: Home / Self Care | Payer: Medicaid - Out of State | Source: Ambulatory Visit | Attending: Cardiology | Admitting: Cardiology

## 2014-02-04 ENCOUNTER — Telehealth: Payer: Self-pay | Admitting: *Deleted

## 2014-02-04 DIAGNOSIS — Z5189 Encounter for other specified aftercare: Secondary | ICD-10-CM | POA: Diagnosis not present

## 2014-02-04 NOTE — Telephone Encounter (Signed)
Spoke with [employer]. Erica [name] will have a letter ready for her on Friday 11/13 at front desk.

## 2014-02-06 ENCOUNTER — Encounter: Payer: Self-pay | Admitting: Internal Medicine

## 2014-02-06 DIAGNOSIS — A5901 Trichomonal vulvovaginitis: Secondary | ICD-10-CM | POA: Insufficient documentation

## 2014-02-06 MED ORDER — METRONIDAZOLE 500 MG PO TABS: 500.0000 mg | ORAL_TABLET | Freq: Two times a day (BID) | ORAL | Status: AC

## 2014-02-06 NOTE — Progress Notes (Signed)
   Subjective:    Patient ID: ILEE [name], female    DOB: 09-Dec-1975, 38 y.o.   MRN: [id number]  Hypertension This is a chronic problem. The current episode started more than 1 year ago. The problem has been rapidly improving since onset. The problem is controlled. Pertinent negatives include no anxiety, blurred vision, chest pain, headaches, malaise/fatigue, neck pain, orthopnea, palpitations, peripheral edema, PND, shortness of breath or sweats. Agents associated with hypertension include NSAIDs. Past treatments include diuretics, calcium channel blockers, beta blockers, angiotensin blockers and central alpha agonists. The current treatment provides moderate improvement. Compliance problems include diet and exercise.  Hypertensive end-organ damage includes kidney disease. Identifiable causes of hypertension include chronic renal disease.      Review of Systems  Constitutional: Negative.  Negative for fever, chills, malaise/fatigue, diaphoresis, appetite change and fatigue.  HENT: Negative.   Eyes: Negative.  Negative for blurred vision.  Respiratory: Negative.  Negative for shortness of breath.   Cardiovascular: Negative.  Negative for chest pain, palpitations, orthopnea, leg swelling and PND.  Gastrointestinal: Negative.  Negative for nausea, vomiting, abdominal pain, diarrhea, constipation and blood in stool.  Endocrine: Negative.   Genitourinary: Negative.  Negative for dysuria, urgency, frequency, hematuria, flank pain, decreased urine volume, vaginal bleeding, vaginal discharge, enuresis, difficulty urinating, genital sores, vaginal pain, menstrual problem, pelvic pain and dyspareunia.  Musculoskeletal: Negative.  Negative for back pain, arthralgias and neck pain.  Skin: Negative.  Negative for rash.  Allergic/Immunologic: Negative.   Neurological: Negative.  Negative for headaches.  Hematological: Negative.  Negative for adenopathy. Does not bruise/bleed easily.    Psychiatric/Behavioral: Negative.        Objective:   Physical Exam  Constitutional: She is oriented to person, place, and time. She appears well-developed and well-nourished. No distress.  HENT:  Head: Normocephalic and atraumatic.  Mouth/Throat: Oropharynx is clear and moist. No oropharyngeal exudate.  Eyes: Conjunctivae are normal. Right eye exhibits no discharge. Left eye exhibits no discharge. No scleral icterus.  Neck: Normal range of motion. Neck supple. No JVD present. No tracheal deviation present. No thyromegaly present.  Cardiovascular: Normal rate, regular rhythm, normal heart sounds and intact distal pulses.  Exam reveals no gallop and no friction rub.   No murmur heard. Pulmonary/Chest: Effort normal and breath sounds normal. No stridor. No respiratory distress. She has no wheezes. She exhibits no tenderness.  Abdominal: Soft. Bowel sounds are normal. She exhibits no distension and no mass. There is no tenderness. There is no rebound and no guarding.  Musculoskeletal: Normal range of motion. She exhibits no edema or tenderness.  Lymphadenopathy:    She has no cervical adenopathy.  Neurological: She is oriented to person, place, and time.  Skin: Skin is warm and dry. No rash noted. She is not diaphoretic. No erythema. No pallor.  Psychiatric: She has a normal mood and affect. Her behavior is normal. Judgment and thought content normal.  Vitals reviewed.         Assessment & Plan:

## 2014-02-06 NOTE — Assessment & Plan Note (Signed)
Her BP is well controlled Wills top the diuretics and nsaids to decline in renal function

## 2014-02-06 NOTE — Assessment & Plan Note (Signed)
She has no s/s of this but it was seen on her urine Will treat with a 7 day course of flagyl

## 2014-02-06 NOTE — Assessment & Plan Note (Signed)
I have asked her to stop the diuretics and nsaids Her renal function is stable over the last few weeks She will avoid nephrotoxic agents Will cont to follow this

## 2014-02-07 ENCOUNTER — Encounter (HOSPITAL_COMMUNITY)
Admission: RE | Admit: 2014-02-07 | Discharge: 2014-02-07 | Disposition: A | Discharge status: Home / Self Care | Payer: Medicaid - Out of State | Source: Ambulatory Visit | Attending: Cardiology | Admitting: Cardiology

## 2014-02-07 ENCOUNTER — Encounter (HOSPITAL_COMMUNITY): Payer: Medicaid - Out of State

## 2014-02-07 ENCOUNTER — Telehealth: Payer: Self-pay | Admitting: *Deleted

## 2014-02-07 ENCOUNTER — Inpatient Hospital Stay: Admission: RE | Admit: 2014-02-07 | Payer: Medicaid - Out of State | Source: Ambulatory Visit

## 2014-02-07 DIAGNOSIS — E876 Hypokalemia: Secondary | ICD-10-CM

## 2014-02-07 DIAGNOSIS — Z5189 Encounter for other specified aftercare: Secondary | ICD-10-CM | POA: Diagnosis not present

## 2014-02-07 MED ORDER — POTASSIUM CHLORIDE CRYS ER 20 MEQ PO TBCR: 20.0000 meq | EXTENDED_RELEASE_TABLET | Freq: Once | ORAL | Status: DC

## 2014-02-07 NOTE — Telephone Encounter (Signed)
Pt called to ask if we called her today and informed the pt that we did not, but noted in pts recent labs from 11/5 that her K was 2.9, this was ordered by Dr [name].   Notified Dr Meda Coffee of abnormal K and per Dr Meda Coffee, this pt needs to increase her Kdur to 20 mEq daily and come in for a repeat BMET next Friday 11/20.  Confirmed the pharmacy of choice with the pt.  Also informed the pt that she should contact Dr [name] office to go over the ordered blood work and urine results.  Pt verbalized understanding and agrees with this plan.  Lab appt made for 11/20.

## 2014-02-08 ENCOUNTER — Telehealth: Payer: Self-pay | Admitting: *Deleted

## 2014-02-08 MED ORDER — ISOSORBIDE MONONITRATE ER 60 MG PO TB24: 60.0000 mg | ORAL_TABLET | Freq: Every day | ORAL | Status: DC

## 2014-02-08 NOTE — Telephone Encounter (Signed)
Received call from [place] from cardiac rehab on 02/07/14 stating that her BP has been elevated during exercise.  Is taking Bystolic 5 mg per Dr. [name].  Also Dr. [name] d/c her triamterene due to her creat being 1.5.  On 02/07/14 at rehab BP 180/100 during exercise and at rest was 140/80.  Also states that she has been having episodes where she "drops" off and appears to be sleeping but is easily aroused.  Pt states that her family has indicated that she does this at home but does not arouse easily. Spoke w/Dr. Meda Coffee who advises for her to increase Imdur to 60 mg daily.  Notified pt today.  Will send new Rx into pharmacy.  She verbalizes understanding.

## 2014-02-09 ENCOUNTER — Encounter (HOSPITAL_COMMUNITY)
Admission: RE | Admit: 2014-02-09 | Discharge: 2014-02-09 | Disposition: A | Discharge status: Home / Self Care | Payer: Medicaid - Out of State | Source: Ambulatory Visit | Attending: Cardiology | Admitting: Cardiology

## 2014-02-09 DIAGNOSIS — Z5189 Encounter for other specified aftercare: Secondary | ICD-10-CM | POA: Diagnosis not present

## 2014-02-09 LAB — GLUCOSE, CAPILLARY: Glucose-Capillary: 80 mg/dL (ref 70–99)
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
## 2014-02-10 ENCOUNTER — Ambulatory Visit (INDEPENDENT_AMBULATORY_CARE_PROVIDER_SITE_OTHER): Payer: Medicaid - Out of State | Admitting: Nurse Practitioner

## 2014-02-10 ENCOUNTER — Encounter: Payer: Self-pay | Admitting: Nurse Practitioner

## 2014-02-10 VITALS — BP 160/80 | HR 87 | Ht 66.0 in | Wt 203.6 lb

## 2014-02-10 DIAGNOSIS — I5032 Chronic diastolic (congestive) heart failure: Secondary | ICD-10-CM

## 2014-02-10 DIAGNOSIS — R55 Syncope and collapse: Secondary | ICD-10-CM | POA: Insufficient documentation

## 2014-02-10 DIAGNOSIS — R400 Somnolence: Secondary | ICD-10-CM | POA: Insufficient documentation

## 2014-02-10 DIAGNOSIS — I10 Essential (primary) hypertension: Secondary | ICD-10-CM

## 2014-02-10 NOTE — Patient Instructions (Signed)
Your physician has recommended that you wear an event monitor. Event monitors are medical devices that record the heart's electrical activity. Doctors most often [place] these monitors to diagnose arrhythmias. Arrhythmias are problems with the speed or rhythm of the heartbeat. The monitor is a small, portable device. You can wear one while you do your normal daily activities. This is usually used to diagnose what is causing palpitations/syncope (passing out).  Your physician has recommended that you have a sleep study. This test records several body functions during sleep, including: brain activity, eye movement, oxygen and carbon dioxide blood levels, heart rate and rhythm, breathing rate and rhythm, the flow of air through your mouth and nose, snoring, body muscle movements, and chest and belly movement.  Your physician recommends that you continue on your current medications as directed. Please refer to the Current Medication list given to you today.

## 2014-02-10 NOTE — Progress Notes (Signed)
Patient Name: Erica [name] Date of Encounter: 02/10/2014  Primary Care Provider:  Scarlette Calico, MD Primary Cardiologist:  Liane Comber, MD   Patient Profile  38 y/o female with a h/o htn and diast chf, who presents for f/u.  Problem List   Past Medical History  Diagnosis Date  . Breast cancer 2010    a. locally advanced left breast carcinoma diagnosed in 2010 and treated with neoadjuvant chemotherapy with docetaxel and cyclophosphamide as well as paclitaxel. This was followed by radiation therapy which was completed in 2011.  . Asthma   . Hypertension   . GERD (gastroesophageal reflux disease)   . Impaired glucose tolerance 04/13/2011  . Anemia, unspecified 04/13/2011  . Asthma 09/27/2010  . Bloating 03/18/2011  . 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 Lymphadenitis, chronic     restricted LEFT extremity  . Dyspnea on exertion     a. 01/2013 Lexi MV: EF 54%, no ischemia/infarct;  b. 05/2013 Echo: EF 60-65%, no rwma, Gr 2 DD;  b.    Past Surgical History  Procedure Laterality Date  . Hand surgery  2012    right; tendon release  . Breast lumpectomy  09/2008    left  . Carpal tunnel release Left 2011  . Tubal ligation  05/11/2011    Procedure: POST PARTUM TUBAL LIGATION;  Surgeon: Emeterio Reeve, MD;  Location: [facility];  Service: Gynecology;  Laterality: Bilateral;  Induced for HTN  . Lymph node dissection  2010    left breast; 2 wks after breast lumpectomy    Allergies  Allergies  Allergen Reactions  . Aspirin Shortness Of Breath and Palpitations    Pt can take ibuprofen without reaction  . Doxycycline Anaphylaxis  . Penicillins Shortness Of Breath and Palpitations  . Kiwi Extract Itching and Swelling    Lip swelling  . Strawberry Hives    HPI  38 y/o female with a h/o DOE, HTN, and diast CHF with nl LV fxn and neg MV in the past.  She works out @ cardiac rehab 3x/wk.  She has had several episodes of sudden loss of consciousness, usually while sitting.  She is not sure  that she isn't just falling asleep but says that she doesn't feel tired before the episodes.  Two occurred while @ cardiac rehab but no strips were kept or sent over.  She believes this happened once while she was standing and leaning up against a wall but she did not fall.  The most recent episode occurred while watching TV with her son.  She is not aware that anything is wrong but family members apparently note that she is not responding and arouse her until she is awake.  She is asymptomatic upon regaining consciousness.    Her sister has also noted that she stops breathing sometimes when she is sleeping.  She denies daytime somnolence and says that she generally feels well rested during the day.  She denies chest pain, palpitations, dyspnea, pnd, orthopnea, n, v, dizziness, 3edema, weight gain, or early satiety.   Home Medications  Prior to Admission medications   Medication Sig Start Date End Date Taking? Authorizing Provider  acetaZOLAMIDE (DIAMOX) 250 MG tablet Take 2 tab in the morning, 1 tab in afternoon, 2 tab at bedtime. 01/18/14  Yes Donika K Patel, DO  albuterol (PROVENTIL HFA;VENTOLIN HFA) 108 (90 BASE) MCG/ACT inhaler Inhale 2 puffs into the lungs every 4 (four) hours as needed for wheezing or shortness of breath (((PLAN A))). 01/26/14  Yes Tammy S Parrett, NP  albuterol (PROVENTIL) (2.5 MG/3ML) 0.083% nebulizer solution Take 3 mLs (2.5 mg total) by nebulization every 4 (four) hours as needed for wheezing or shortness of breath (((PLAN B))). 01/26/14  Yes Tammy S Parrett, NP  amLODipine-olmesartan (AZOR) 5-40 MG per tablet Take 2 tablets by mouth daily. 12/23/13  Yes [name], MD  Biotin 1000 MCG tablet Take 1,000 mcg by mouth daily.   Yes Historical Provider, MD  cloNIDine (CATAPRES) 0.3 MG tablet Take 1 tablet (0.3 mg total) by mouth 2 (two) times daily. 08/24/13  Yes [name], MD  dextromethorphan-guaiFENesin [facility] DM) 30-600 MG per 12 hr tablet Take 1 tablet by  mouth every 12 (twelve) hours as needed for cough.   Yes Historical Provider, MD  diltiazem (CARDIZEM CD) 240 MG 24 hr capsule Take 1 capsule (240 mg total) by mouth daily. 11/30/13  Yes [name], MD  diphenhydrAMINE (BENADRYL) 25 MG tablet Take 2 tablets (50 mg total) by mouth every 6 (six) hours. x4 days 01/29/14  Yes Mercedes Strupp Camprubi-Soms, PA-C  EPINEPHrine 0.3 mg/0.3 mL IJ SOAJ injection Inject 0.3 mLs (0.3 mg total) into the muscle once. 01/28/14  Yes [name], MD  HYDROcodone-homatropine (HYDROMET) 5-1.5 MG/5ML syrup Take 5 mLs by mouth every 6 (six) hours as needed for cough. 01/26/14  Yes Tammy S Parrett, NP  hyoscyamine (LEVSIN SL) 0.125 MG SL tablet Place 1 tablet (0.125 mg total) under the tongue every 8 (eight) hours as needed for cramping. 09/02/13  Yes Jerene Bears, MD  isosorbide mononitrate (IMDUR) 60 MG 24 hr tablet Take 1 tablet (60 mg total) by mouth daily. 02/08/14  Yes [name], MD  lidocaine (XYLOCAINE) 5 % ointment Apply with gloves, twice daily as needed to legs. 01/18/14  Yes Donika K Patel, DO  LORazepam (ATIVAN) 0.5 MG tablet Take 1 tablet (0.5 mg total) by mouth every 8 (eight) hours as needed for anxiety. 01/31/14  Yes Marcelino Duster, NP  metoCLOPramide (REGLAN) 10 MG tablet Take 1 tablet (10 mg total) by mouth 4 (four) times daily -  with meals and at bedtime. 01/26/13  Yes Jerene Bears, MD  metroNIDAZOLE (FLAGYL) 500 MG tablet Take 1 tablet (500 mg total) by mouth 2 (two) times daily. 02/06/14 02/20/14 Yes [name], MD  mometasone (NASONEX) 50 MCG/ACT nasal spray Place 2 sprays into the nose daily. 07/27/13  Yes Julianne Rice, MD  mometasone-formoterol [facility]) 200-5 MCG/ACT AERO Inhale 2 puffs into the lungs 2 (two) times daily. 01/26/14  Yes Tammy S Parrett, NP  montelukast (SINGULAIR) 10 MG tablet Take 1 tablet (10 mg total) by mouth at bedtime. 01/26/14  Yes Tammy S Parrett, NP  nebivolol (BYSTOLIC) 5 MG tablet Take 5 mg by mouth  daily.   Yes Historical Provider, MD  nitroGLYCERIN (NITROSTAT) 0.4 MG SL tablet Place 1 tablet (0.4 mg total) under the tongue every 5 (five) minutes as needed for chest pain. 08/24/13  Yes [name], MD  ondansetron (ZOFRAN) 4 MG tablet Take 1 tablet (4 mg total) by mouth every 6 (six) hours. 10/22/13  Yes Tanna Furry, MD  ondansetron (ZOFRAN-ODT) 4 MG disintegrating tablet Take 1 tablet (4 mg total) by mouth every 6 (six) hours as needed for nausea or vomiting. 01/28/14  Yes [name], MD  pantoprazole (PROTONIX) 40 MG tablet Take 1 tablet (40 mg total) by mouth 2 (two) times daily before a meal. 09/02/13  Yes Ulice Dash  Everitt Amber, MD  potassium chloride (K-DUR,KLOR-CON) 10 MEQ tablet Take 20 mEq by mouth 2 (two) times daily. Pt is taking 20 meq once daily   Yes Historical Provider, MD  pregabalin (LYRICA) 200 MG capsule Take 1 capsule (200 mg total) by mouth 3 (three) times daily. 01/18/14  Yes Donika Keith Rake, DO  ranitidine (ZANTAC) 150 MG tablet Take 2 tablets (300 mg total) by mouth daily with breakfast. x4 days 01/29/14  Yes Mercedes Strupp Camprubi-Soms, PA-C  sucralfate (CARAFATE) 1 G tablet Take 1 tablet (1 g total) by mouth 4 (four) times daily. 03/07/12  Yes [name], MD  traMADol (ULTRAM) 50 MG tablet Take 1 tablet (50 mg total) by mouth every 4 (four) hours as needed (1-2 tabs as needed for cough or pain). 01/30/14  Yes [name], MD    Review of Systems  ? Syncope as above.  All other systems reviewed and are otherwise negative except as noted above.  Physical Exam  Blood pressure 160/80, pulse 87, height 5\' 6"  (1.676 m), weight 203 lb 9.6 oz (92.352 kg).  General: Pleasant, NAD Psych: Normal affect. Neuro: Alert and oriented X 3. Moves all extremities spontaneously. HEENT: Normal  Neck: Supple without bruits or JVD. Lungs:  Resp regular and unlabored, CTA. Heart: RRR no s3, s4, or murmurs. Abdomen: Obese, soft, non-tender, non-distended, BS + x 4.  Extremities:  No clubbing, cyanosis or edema. DP/PT/Radials 2+ and equal bilaterally.  Accessory Clinical Findings  ECG - RSR, 87, no acute ST/T changes.  Assessment & Plan  1.  Syncope:  ?Marland Kitchen  As above, she reports that she has had sudden loss of consciousness, usually while sitting.  There is no prodrome or sequelae.  She is not sure that she isn't just falling asleep.  Two episodes occurred while at cardiac rehab but it's not clear that she was being monitored at the time.  I will arrange for a 30 day event monitor so that we can r/o arrhythmia.  As she also has a h/o snoring and her sister told her that she stops breathing at night, I will also refer for a sleep eval.  She has prev had nl EF and nl MV.  2.  HTN:  BP elevated in clinic.  I reviewed records from cardiac rehab where pressure trends more normally.  I will not make any changes to her meds today.  3.  Chronic diastolic CHF:  euvolemic on exam.  As above BP's reviewed and stable in cardiac rehab.  4.  Dispo:  F/u monitor and sleep study. F/U with Dr. Meda Coffee in 3 mos.    Murray Hodgkins, NP 02/10/2014, 5:32 PM

## 2014-02-11 ENCOUNTER — Encounter: Payer: Self-pay | Admitting: Nurse Practitioner

## 2014-02-11 ENCOUNTER — Encounter (HOSPITAL_COMMUNITY)
Admission: RE | Admit: 2014-02-11 | Discharge: 2014-02-11 | Disposition: A | Discharge status: Home / Self Care | Payer: Medicaid - Out of State | Source: Ambulatory Visit | Attending: Cardiology | Admitting: Cardiology

## 2014-02-11 DIAGNOSIS — Z5189 Encounter for other specified aftercare: Secondary | ICD-10-CM | POA: Diagnosis not present

## 2014-02-13 ENCOUNTER — Encounter: Payer: Self-pay | Admitting: Internal Medicine

## 2014-02-14 ENCOUNTER — Encounter: Payer: Self-pay | Admitting: Internal Medicine

## 2014-02-14 ENCOUNTER — Encounter (HOSPITAL_COMMUNITY): Payer: Medicaid - Out of State

## 2014-02-14 ENCOUNTER — Other Ambulatory Visit: Payer: Self-pay | Admitting: Internal Medicine

## 2014-02-14 ENCOUNTER — Encounter (INDEPENDENT_AMBULATORY_CARE_PROVIDER_SITE_OTHER): Payer: Medicaid - Out of State

## 2014-02-14 ENCOUNTER — Encounter: Payer: Self-pay | Admitting: *Deleted

## 2014-02-14 DIAGNOSIS — R55 Syncope and collapse: Secondary | ICD-10-CM

## 2014-02-14 NOTE — Progress Notes (Signed)
Patient ID: Erica [name], female   DOB: [birth year], 38 y.o.   MRN: [id number] [employer] verite 30 day cardiac event monitor applied to patient.

## 2014-02-16 ENCOUNTER — Encounter (HOSPITAL_COMMUNITY)
Admission: RE | Admit: 2014-02-16 | Discharge: 2014-02-16 | Disposition: A | Discharge status: Home / Self Care | Payer: Medicaid - Out of State | Source: Ambulatory Visit | Attending: Cardiology | Admitting: Cardiology

## 2014-02-16 DIAGNOSIS — Z5189 Encounter for other specified aftercare: Secondary | ICD-10-CM | POA: Diagnosis not present

## 2014-02-17 ENCOUNTER — Other Ambulatory Visit: Payer: Self-pay | Admitting: Internal Medicine

## 2014-02-17 ENCOUNTER — Encounter: Payer: Self-pay | Admitting: Internal Medicine

## 2014-02-17 DIAGNOSIS — I10 Essential (primary) hypertension: Secondary | ICD-10-CM

## 2014-02-17 DIAGNOSIS — N179 Acute kidney failure, unspecified: Secondary | ICD-10-CM

## 2014-02-18 ENCOUNTER — Encounter: Payer: Self-pay | Admitting: Internal Medicine

## 2014-02-18 ENCOUNTER — Other Ambulatory Visit (INDEPENDENT_AMBULATORY_CARE_PROVIDER_SITE_OTHER): Payer: Medicaid - Out of State

## 2014-02-18 ENCOUNTER — Other Ambulatory Visit (INDEPENDENT_AMBULATORY_CARE_PROVIDER_SITE_OTHER): Payer: Medicaid - Out of State | Admitting: *Deleted

## 2014-02-18 ENCOUNTER — Encounter (HOSPITAL_COMMUNITY)
Admission: RE | Admit: 2014-02-18 | Discharge: 2014-02-18 | Disposition: A | Discharge status: Home / Self Care | Payer: Medicaid - Out of State | Source: Ambulatory Visit | Attending: Cardiology | Admitting: Cardiology

## 2014-02-18 DIAGNOSIS — I10 Essential (primary) hypertension: Secondary | ICD-10-CM

## 2014-02-18 DIAGNOSIS — E876 Hypokalemia: Secondary | ICD-10-CM

## 2014-02-18 DIAGNOSIS — N179 Acute kidney failure, unspecified: Secondary | ICD-10-CM

## 2014-02-18 DIAGNOSIS — Z5189 Encounter for other specified aftercare: Secondary | ICD-10-CM | POA: Diagnosis not present

## 2014-02-18 LAB — URINALYSIS, ROUTINE W REFLEX MICROSCOPIC
Bilirubin Urine: NEGATIVE
HGB URINE DIPSTICK: NEGATIVE
KETONES UR: NEGATIVE
Leukocytes, UA: NEGATIVE
Nitrite: NEGATIVE
SPECIFIC GRAVITY, URINE: 1.015 (ref 1.000–1.030)
Total Protein, Urine: NEGATIVE
URINE GLUCOSE: NEGATIVE
UROBILINOGEN UA: 0.2 (ref 0.0–1.0)
pH: 7 (ref 5.0–8.0)

## 2014-02-18 LAB — BASIC METABOLIC PANEL
BUN: 11 mg/dL (ref 6–23)
BUN: 10 mg/dL (ref 6–23)
CHLORIDE: 112 meq/L (ref 96–112)
CO2: 19 mEq/L (ref 19–32)
CO2: 19 mEq/L (ref 19–32)
Calcium: 8.4 mg/dL (ref 8.4–10.5)
Calcium: 8.3 mg/dL — ABNORMAL LOW (ref 8.4–10.5)
Chloride: 113 mEq/L — ABNORMAL HIGH (ref 96–112)
Creatinine, Ser: 1 mg/dL (ref 0.4–1.2)
Creatinine, Ser: 1 mg/dL (ref 0.4–1.2)
GFR: 80.67 mL/min (ref >60.00)
GFR: 79.73 mL/min (ref >60.00)
Glucose, Bld: 88 mg/dL (ref 70–99)
Glucose, Bld: 101 mg/dL — ABNORMAL HIGH (ref 70–99)
POTASSIUM: 3.3 meq/L — AB (ref 3.5–5.1)
Potassium: 3.5 mEq/L (ref 3.5–5.1)
SODIUM: 137 meq/L (ref 135–145)
Sodium: 136 mEq/L (ref 135–145)

## 2014-02-20 LAB — CULTURE, URINE COMPREHENSIVE

## 2014-02-21 ENCOUNTER — Other Ambulatory Visit: Payer: Self-pay | Admitting: Internal Medicine

## 2014-02-21 ENCOUNTER — Encounter (HOSPITAL_COMMUNITY)
Admission: RE | Admit: 2014-02-21 | Discharge: 2014-02-21 | Disposition: A | Discharge status: Home / Self Care | Payer: Medicaid - Out of State | Source: Ambulatory Visit | Attending: Cardiology | Admitting: Cardiology

## 2014-02-21 ENCOUNTER — Encounter: Payer: Self-pay | Admitting: Internal Medicine

## 2014-02-21 ENCOUNTER — Telehealth: Payer: Self-pay | Admitting: *Deleted

## 2014-02-21 DIAGNOSIS — Z5189 Encounter for other specified aftercare: Secondary | ICD-10-CM | POA: Diagnosis not present

## 2014-02-21 DIAGNOSIS — N390 Urinary tract infection, site not specified: Secondary | ICD-10-CM | POA: Insufficient documentation

## 2014-02-21 NOTE — Telephone Encounter (Signed)
-----   Message from Lowell Guitar, RN sent at 02/21/2014  3:16 PM EST ----- Regarding: Urinalysis results Dear Atzel Mccambridge,  Please review 02/18/14 u/a culture with Dr. [name], positive for group B strep.  Does pt need to take another antibiotic?  She recently finished flagyl 500mg  BID x 7 days.  Thank you, Andi Hence, RN, BSN Cardiac Pulmonary Rehab

## 2014-02-21 NOTE — Telephone Encounter (Signed)
Dr. [name] informed.

## 2014-02-23 ENCOUNTER — Other Ambulatory Visit: Payer: Self-pay | Admitting: Internal Medicine

## 2014-02-23 ENCOUNTER — Encounter (HOSPITAL_COMMUNITY): Payer: Medicaid - Out of State

## 2014-02-23 DIAGNOSIS — N390 Urinary tract infection, site not specified: Secondary | ICD-10-CM

## 2014-02-25 ENCOUNTER — Other Ambulatory Visit: Payer: Self-pay | Admitting: Internal Medicine

## 2014-02-25 DIAGNOSIS — N3000 Acute cystitis without hematuria: Secondary | ICD-10-CM

## 2014-02-25 MED ORDER — FLUCONAZOLE 150 MG PO TABS: 150.0000 mg | ORAL_TABLET | Freq: Once | ORAL | Status: DC

## 2014-02-25 MED ORDER — AMPICILLIN 500 MG PO CAPS: 500.0000 mg | ORAL_CAPSULE | Freq: Three times a day (TID) | ORAL | Status: DC

## 2014-02-28 ENCOUNTER — Telehealth: Payer: Self-pay | Admitting: *Deleted

## 2014-02-28 ENCOUNTER — Encounter (HOSPITAL_COMMUNITY)
Admission: RE | Admit: 2014-02-28 | Discharge: 2014-02-28 | Disposition: A | Discharge status: Home / Self Care | Payer: Medicaid - Out of State | Source: Ambulatory Visit | Attending: Cardiology | Admitting: Cardiology

## 2014-02-28 DIAGNOSIS — Z5189 Encounter for other specified aftercare: Secondary | ICD-10-CM | POA: Diagnosis not present

## 2014-02-28 NOTE — Progress Notes (Signed)
Pt reported at cardiac rehab having presyncopal episode Saturday night while at her mothers house.  Pt reports she pressed the notification button on her 30 day event monitor and was notified by monitoring staff who advised pt to present to ED.  Pt declined since she was out of town.  Pt denies further episodes this week, was able to exercise today at rehab without difficulty.  Sinus rhythm on telemetry monitor at rehab.  pc to Dr. Francesca Oman office to check 30 day event monitor report.  Spoke to IVY, Dr Francesca Oman nurse who advised monitor tracings normal sinus rhythm throughout including time of pt reported episode.  Pt made appointment with Dr. Posey Pronto, neurologist for evaluation.  appt scheduled for 03/03/14 @10 :45.  Unable to reach pt by phone to advise of appt time/date.

## 2014-02-28 NOTE — Telephone Encounter (Signed)
Erica [name] from cardiac rehab called to inform Dr Meda Coffee that the pt told her today at rehab, that this past Saturday she experienced another syncopal episode for about a minute while sitting on the bed.  Pt states that her 30 day event monitor captured this, and the company contacted her to advise on going to the ER immediately.  Pulled up pts event monitor report from 11/28 and noted that the pt was in NSR at the time of the syncopal episode.  On call Dr Percival Spanish was notified about event.  Informed Erica [name] of event monitor results from that episode.  Informed Erica [name] that I will show Dr Meda Coffee the report tomorrow when she returns to the office.  Erica [name] reports the pt did cardiac rehab today with no complications or complaints.  Erica [name] reports that she is going to notify the pts Neurologist to get a follow-up appt set.  Pt has a f/u appt with Dr Meda Coffee on 12/14.

## 2014-03-02 ENCOUNTER — Encounter (HOSPITAL_COMMUNITY)
Admission: RE | Admit: 2014-03-02 | Discharge: 2014-03-02 | Disposition: A | Discharge status: Home / Self Care | Payer: Medicaid - Out of State | Source: Ambulatory Visit | Attending: Cardiology | Admitting: Cardiology

## 2014-03-02 DIAGNOSIS — Z5189 Encounter for other specified aftercare: Secondary | ICD-10-CM | POA: Insufficient documentation

## 2014-03-02 DIAGNOSIS — I429 Cardiomyopathy, unspecified: Secondary | ICD-10-CM | POA: Insufficient documentation

## 2014-03-02 DIAGNOSIS — I5030 Unspecified diastolic (congestive) heart failure: Secondary | ICD-10-CM | POA: Insufficient documentation

## 2014-03-02 NOTE — Progress Notes (Signed)
Pt made aware of scheduled appt with Dr. Posey Pronto 03/03/14 @10 :16. Understanding verbalized

## 2014-03-03 ENCOUNTER — Ambulatory Visit (INDEPENDENT_AMBULATORY_CARE_PROVIDER_SITE_OTHER): Payer: Self-pay | Admitting: Neurology

## 2014-03-03 ENCOUNTER — Encounter: Payer: Self-pay | Admitting: Neurology

## 2014-03-03 VITALS — BP 148/80 | HR 100 | Ht 67.0 in | Wt 202.1 lb

## 2014-03-03 DIAGNOSIS — G620 Drug-induced polyneuropathy: Secondary | ICD-10-CM

## 2014-03-03 DIAGNOSIS — G932 Benign intracranial hypertension: Secondary | ICD-10-CM

## 2014-03-03 DIAGNOSIS — T451X5A Adverse effect of antineoplastic and immunosuppressive drugs, initial encounter: Secondary | ICD-10-CM

## 2014-03-03 DIAGNOSIS — H5340 Unspecified visual field defects: Secondary | ICD-10-CM

## 2014-03-03 NOTE — Patient Instructions (Addendum)
1.  Increase diamox to 500mg  three times daily 2.  Continue lyrica 200mg  three times daily 3.  Try to reduce lidocaine ointment to only once daily to see if this improves passing out spells 4.  MRI imaging of the brain and blood vessels 5.  Please call your opthalmologist for sooner appointment for visual field testing  6.  No driving  7.  Return to clinic 2-3 months

## 2014-03-03 NOTE — Progress Notes (Signed)
Follow-up Visit   Date: 03/03/2014    Erica [name] MRN: [id number] DOB: 1976/02/04   Interim History: Erica [name] is a 38 y.o. 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    History of present illness: Starting in 2010 after her second course of chemotherapy she developed numbness/tingling of the arm and legs, described as her hands are asleep. She has burning sensation and shaking of her hands. Activity worsens pain and she has difficulty with fine motor tasks such as buttoning, tying shoe laces, and braiding her daughter's hair. She falls about once per month because her legs give out. She was started on Lyrica 50mg  TID around March which has helped slightly.   Of note, she history of pseudotumor cerebri, followed by ophthalmology, and currently takes diamox 500mg  BID. 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 She had lumbar puncture performed in the seated position with documented OP of 43 and 32cc of fluid removed. She had a second lumbar puncture in 2014 at which time OP was 18 mmHg.  - Follow-up 10/15/2013:  No significant difference in pain since increasing Lyrica to 100mg  BID.  Overall, symptoms remain unchanged and she continues to have numbness/tingling of the hands and feet.   - Follow-up 01/18/2014:  She had one interval emergency room visit due to severe headache and had LP which showed OP of 26.  Since then, headache has been under control.  Her leg pain continues to be problematic with no lasting benefit despite increase Lyrica to 200mg  BID.  She has no medication side effects.     - UPDATE 03/03/2014:  At her lats visit, we increased Lyrica to 200mg  TID and started lidocaine ointment.  She reports less shaking of her hand and reports greater than 50% improvement of pain with lidocaine.  She reports having new spells of syncope, which seems to only occur when she is sitting. There is loss of consciousness, lasting only a few seconds.  Following the spells, there is no fatigue, confusion, or weakness. On one ocassion, she felt herself leaning to the side and caught herself when at therapy. She is unaware of the spells.  She is on 3-day cardiac monitor and has sleep study pending.  Headaches and vision is stable.   Medications:  Current Outpatient Prescriptions on File Prior to Visit  Medication Sig Dispense Refill  . acetaZOLAMIDE (DIAMOX) 250 MG tablet Take 2 tab in the morning, 1 tab in afternoon, 2 tab at bedtime. 150 tablet 5  . albuterol (PROVENTIL HFA;VENTOLIN HFA) 108 (90 BASE) MCG/ACT inhaler Inhale 2 puffs into the lungs every 4 (four) hours as needed for wheezing or shortness of breath (((PLAN A))). 18 g 5  . albuterol (PROVENTIL) (2.5 MG/3ML) 0.083% nebulizer solution Take 3 mLs (2.5 mg total) by nebulization every 4 (four) hours as needed for wheezing or shortness of breath (((PLAN B))). 300 mL 5  . amLODipine-olmesartan (AZOR) 5-40 MG per tablet Take 2 tablets by mouth daily. 30 tablet 3  . ampicillin (PRINCIPEN) 500 MG capsule Take 1 capsule (500 mg total) by mouth 3 (three) times daily. 21 capsule 0  . Biotin 1000 MCG tablet Take 1,000 mcg by mouth daily.    Marland Kitchen  cloNIDine (CATAPRES) 0.3 MG tablet Take 1 tablet (0.3 mg total) by mouth 2 (two) times daily. 90 tablet 3  . dextromethorphan-guaiFENesin (MUCINEX DM) 30-600 MG per 12 hr tablet Take 1 tablet by mouth every 12 (twelve) hours as needed for cough.    . diltiazem (CARDIZEM CD) 240 MG 24 hr capsule Take 1 capsule (240 mg total) by mouth daily. 90 capsule 4  . diphenhydrAMINE (BENADRYL) 25 MG tablet Take 2  tablets (50 mg total) by mouth every 6 (six) hours. x4 days 32 tablet 0  . EPINEPHrine 0.3 mg/0.3 mL IJ SOAJ injection Inject 0.3 mLs (0.3 mg total) into the muscle once. 2 Device 1  . fluconazole (DIFLUCAN) 150 MG tablet Take 1 tablet (150 mg total) by mouth once. 1 tablet 3  . HYDROcodone-homatropine (HYDROMET) 5-1.5 MG/5ML syrup Take 5 mLs by mouth every 6 (six) hours as needed for cough. 240 mL 0  . hyoscyamine (LEVSIN SL) 0.125 MG SL tablet Place 1 tablet (0.125 mg total) under the tongue every 8 (eight) hours as needed for cramping. 30 tablet 3  . isosorbide mononitrate (IMDUR) 60 MG 24 hr tablet Take 1 tablet (60 mg total) by mouth daily. 30 tablet 11  . lidocaine (XYLOCAINE) 5 % ointment Apply with gloves, twice daily as needed to legs. 50 g 3  . LORazepam (ATIVAN) 0.5 MG tablet Take 1 tablet (0.5 mg total) by mouth every 8 (eight) hours as needed for anxiety. 60 tablet 2  . metoCLOPramide (REGLAN) 10 MG tablet Take 1 tablet (10 mg total) by mouth 4 (four) times daily -  with meals and at bedtime. 120 tablet 3  . mometasone (NASONEX) 50 MCG/ACT nasal spray Place 2 sprays into the nose daily. 17 g 12  . mometasone-formoterol (DULERA) 200-5 MCG/ACT AERO Inhale 2 puffs into the lungs 2 (two) times daily. 13 g 5  . montelukast (SINGULAIR) 10 MG tablet Take 1 tablet (10 mg total) by mouth at bedtime. 30 tablet 5  . nebivolol (BYSTOLIC) 5 MG tablet Take 5 mg by mouth daily.    . nitroGLYCERIN (NITROSTAT) 0.4 MG SL tablet Place 1 tablet (0.4 mg total) under the tongue every 5 (five) minutes as needed for chest pain. 90 tablet 3  . ondansetron (ZOFRAN) 4 MG tablet Take 1 tablet (4 mg total) by mouth every 6 (six) hours. 12 tablet 0  . ondansetron (ZOFRAN-ODT) 4 MG disintegrating tablet Take 1 tablet (4 mg total) by mouth every 6 (six) hours as needed for nausea or vomiting. 20 tablet 5  . pantoprazole (PROTONIX) 40 MG tablet Take 1 tablet (40 mg total) by mouth 2 (two) times daily before a meal. 60  tablet 3  . potassium chloride (K-DUR,KLOR-CON) 10 MEQ tablet Take 20 mEq by mouth 2 (two) times daily. Pt is taking 20 meq once daily    . pregabalin (LYRICA) 200 MG capsule Take 1 capsule (200 mg total) by mouth 3 (three) times daily. 90 capsule 5  . ranitidine (ZANTAC) 150 MG tablet Take 2 tablets (300 mg total) by mouth daily with breakfast. x4 days 8 tablet 0  . sucralfate (CARAFATE) 1 G tablet Take 1 tablet (1 g total) by mouth 4 (four) times daily. 30 tablet 0  . traMADol (ULTRAM) 50 MG tablet Take 1 tablet (50 mg total) by mouth every 4 (four) hours as needed (1-2 tabs as needed for cough or pain). 30 tablet 1   No current facility-administered medications on file prior to visit.  Allergies:  Allergies  Allergen Reactions  . Aspirin Shortness Of Breath and Palpitations    Pt can take ibuprofen without reaction  . Doxycycline Anaphylaxis  . Penicillins Shortness Of Breath and Palpitations  . Kiwi Extract Itching and Swelling    Lip swelling  . Strawberry Hives     Review of Systems:  CONSTITUTIONAL: No fevers, chills, night sweats, or weight loss.   EYES: No visual changes or eye pain ENT: No hearing changes.  No history of nose bleeds.   RESPIRATORY: No cough, wheezing and shortness of breath.   CARDIOVASCULAR: Negative for chest pain, and palpitations.   GI: Negative for abdominal discomfort, blood in stools or black stools.  No recent change in bowel habits.   GU:  No history of incontinence.   MUSCLOSKELETAL: No history of joint pain or swelling.  No myalgias.   SKIN: Negative for lesions, rash, and itching.   ENDOCRINE: Negative for cold or heat intolerance, polydipsia or goiter.   PSYCH:  No depression or anxiety symptoms.   NEURO: As Above.   Vital Signs:  BP 148/80 mmHg  Pulse 100  Ht 5\' 7"  (1.702 m)  Wt 202 lb 2 oz (91.683 kg)  BMI 31.65 kg/m2  SpO2 98%  Neurological Exam: MENTAL STATUS including orientation to time, place, person, recent and remote  memory, attention span and concentration, language, and fund of knowledge is normal.  Speech is not dysarthric.  CRANIAL NERVES:  No papilledema on fundoscopic exam, disc appears sharp bilaterally.  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  Visual field testing of the left eye is intact.  Pupils equal round and reactive to light.  Normal conjugate, extra-ocular eye movements in all directions of gaze.  No ptosis.  MOTOR:  Motor strength is 5/5 in all extremities.  MSRs:  Reflexes are 2+/4 throughout.  SENSORY:  Intact to vibration throughout, mild asymmetric with 20% reduction at right knee.  COORDINATION/GAIT:   Gait narrow based and stable.   Data: Lab Results  Component Value Date   TSH 0.90 07/14/2013   Lab Results  Component Value Date   VITAMINB12 605 10/15/2013  [place] 124  CSF 09/18/2011: R269 W0 G65 P17, CSF cytology-neg OP 43, 32cc removed   LP 02/05/2013: OP 18, CP 17 after 21 cc removed  LP 10/25/2013:  OP 26, CP 5 after 15 cc removed  Labs 09/17/2011: vitamin B12 725, RPR NR   CT head 02/05/2013: No acute abnormality   MRI brain 08/13/2010:  1. No evidence for metastatic disease to the brain.  2. Scattered subcortical T2 and FLAIR hyperintensities are slightly greater than expected for age. The finding is nonspecific  but can be seen in the setting of chronic microvascular ischemia, a demyelinating process such as multiple sclerosis, vasculitis, complicated migraine headaches, or as the sequelae of a prior infectious or inflammatory process. The lesions could be related to prior brain radiation if this has been performed.    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 Symptoms manifesting with painful parsthesias of her hands and feet which started following chemotherapy with docetaxel/cyclophosphamide (2010).  Clinically, she reports having improvement of pain after increasing Lyrica to 200mg  TID and adding lidocaine ointment to her feet.   Because of her new syncopal spells and known cardiac history, will recommend that she reduce lidocaine to once daily even though systemic absorption is so little with topical ointment.  Syncopal spells These episodes are very brief with quick return back to baseline.  They do not sound like seizures, although absence seizures cannot be excluded.  Consider EEG going forward if her cardiac work-up is negative.  For completeness, I will obtain MRI/A brain and carotids to look for vessel stenosis as well as structural abnormalities especially with her new temporal visual field defect involving the right eye, although this may also be related to pseudotumor cerebri, but her fundoscopic exam shows no disc edema. Patient informed of [place] law of no driving for 49-months since last spell of impaired consciousness  Pseudotumor cerebri No papilledema on exam, but she has new right eye temporal visual field deficit on exam.  I have asked her to follow-up with opthalmologist asap for formal visual field testing.  I will also increase diamox to 500mg  TID.  Check BMP  Return to clinic in 2 months    The duration of this appointment visit was 30 minutes of face-to-face time with the patient.  Greater than 50% of this time was spent in counseling, explanation of diagnosis, planning of further management, and coordination of care.   Thank you for allowing me to participate in patient's care.  If I can answer any additional questions, I would be pleased to do so.    Sincerely,    Ardith Test K. Posey Pronto, DO

## 2014-03-04 ENCOUNTER — Encounter (HOSPITAL_COMMUNITY)
Admission: RE | Admit: 2014-03-04 | Discharge: 2014-03-04 | Disposition: A | Discharge status: Home / Self Care | Payer: Medicaid - Out of State | Source: Ambulatory Visit | Attending: Cardiology | Admitting: Cardiology

## 2014-03-04 DIAGNOSIS — Z5189 Encounter for other specified aftercare: Secondary | ICD-10-CM | POA: Diagnosis not present

## 2014-03-07 ENCOUNTER — Encounter (HOSPITAL_COMMUNITY)
Admission: RE | Admit: 2014-03-07 | Discharge: 2014-03-07 | Disposition: A | Discharge status: Home / Self Care | Payer: Medicaid - Out of State | Source: Ambulatory Visit | Attending: Cardiology | Admitting: Cardiology

## 2014-03-07 DIAGNOSIS — Z5189 Encounter for other specified aftercare: Secondary | ICD-10-CM | POA: Diagnosis not present

## 2014-03-09 ENCOUNTER — Encounter (HOSPITAL_COMMUNITY)
Admission: RE | Admit: 2014-03-09 | Discharge: 2014-03-09 | Disposition: A | Discharge status: Home / Self Care | Payer: Medicaid - Out of State | Source: Ambulatory Visit | Attending: Cardiology | Admitting: Cardiology

## 2014-03-09 DIAGNOSIS — Z5189 Encounter for other specified aftercare: Secondary | ICD-10-CM | POA: Diagnosis not present

## 2014-03-11 ENCOUNTER — Encounter (HOSPITAL_COMMUNITY)
Admission: RE | Admit: 2014-03-11 | Discharge: 2014-03-11 | Disposition: A | Discharge status: Home / Self Care | Payer: Medicaid - Out of State | Source: Ambulatory Visit | Attending: Cardiology | Admitting: Cardiology

## 2014-03-11 DIAGNOSIS — Z5189 Encounter for other specified aftercare: Secondary | ICD-10-CM | POA: Diagnosis not present

## 2014-03-13 ENCOUNTER — Other Ambulatory Visit: Payer: Medicaid - Out of State

## 2014-03-13 ENCOUNTER — Inpatient Hospital Stay: Admission: RE | Admit: 2014-03-13 | Payer: Medicaid - Out of State | Source: Ambulatory Visit

## 2014-03-14 ENCOUNTER — Encounter: Payer: Self-pay | Admitting: Cardiology

## 2014-03-14 ENCOUNTER — Ambulatory Visit: Payer: Medicaid - Out of State | Admitting: Cardiology

## 2014-03-14 ENCOUNTER — Encounter (HOSPITAL_COMMUNITY)
Admission: RE | Admit: 2014-03-14 | Discharge: 2014-03-14 | Disposition: A | Discharge status: Home / Self Care | Payer: Medicaid - Out of State | Source: Ambulatory Visit | Attending: Cardiology | Admitting: Cardiology

## 2014-03-14 ENCOUNTER — Ambulatory Visit (INDEPENDENT_AMBULATORY_CARE_PROVIDER_SITE_OTHER): Payer: Medicaid - Out of State | Admitting: Cardiology

## 2014-03-14 VITALS — BP 136/82 | HR 99 | Ht 67.0 in | Wt 205.0 lb

## 2014-03-14 DIAGNOSIS — I10 Essential (primary) hypertension: Secondary | ICD-10-CM

## 2014-03-14 DIAGNOSIS — R0989 Other specified symptoms and signs involving the circulatory and respiratory systems: Secondary | ICD-10-CM

## 2014-03-14 DIAGNOSIS — Z5189 Encounter for other specified aftercare: Secondary | ICD-10-CM | POA: Diagnosis not present

## 2014-03-14 DIAGNOSIS — R600 Localized edema: Secondary | ICD-10-CM

## 2014-03-14 DIAGNOSIS — I5032 Chronic diastolic (congestive) heart failure: Secondary | ICD-10-CM

## 2014-03-14 DIAGNOSIS — R55 Syncope and collapse: Secondary | ICD-10-CM

## 2014-03-14 MED ORDER — POTASSIUM CHLORIDE CRYS ER 20 MEQ PO TBCR: EXTENDED_RELEASE_TABLET | ORAL | Status: DC

## 2014-03-14 MED ORDER — FUROSEMIDE 40 MG PO TABS: 40.0000 mg | ORAL_TABLET | Freq: Every day | ORAL | Status: DC | PRN

## 2014-03-14 NOTE — Progress Notes (Signed)
Patient ID: Erica [name], female   DOB: 15-Mar-1976, 38 y.o.   MRN: [id number] Patient ID: Erica [name], female   DOB: July 14, 1975, 38 y.o.   MRN: [id number]    Patient Name: Erica [name] Date of Encounter: 03/14/2014  Primary Care Provider:  Scarlette Calico, MD Primary Cardiologist:  Ena Dawley H  Patient Profile  Chest pain  Problem List   Past Medical History  Diagnosis Date  . Breast cancer 2010    a. locally advanced left breast carcinoma diagnosed in 2010 and treated with neoadjuvant chemotherapy with docetaxel and cyclophosphamide as well as paclitaxel. This was followed by radiation therapy which was completed in 2011.  . Asthma   . Hypertension   . GERD (gastroesophageal reflux disease)   . Impaired glucose tolerance 04/13/2011  . Anemia, unspecified 04/13/2011  . Asthma 09/27/2010  . Bloating 03/18/2011  . 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 Lymphadenitis, chronic     restricted LEFT extremity  . Dyspnea on exertion     a. 01/2013 Lexi MV: EF 54%, no ischemia/infarct;  b. 05/2013 Echo: EF 60-65%, no rwma, Gr 2 DD;  b.    Past Surgical History  Procedure Laterality Date  . Hand surgery  2012    right; tendon release  . Breast lumpectomy  09/2008    left  . Carpal tunnel release Left 2011  . Tubal ligation  05/11/2011    Procedure: POST PARTUM TUBAL LIGATION;  Surgeon: Emeterio Reeve, MD;  Location: [facility];  Service: Gynecology;  Laterality: Bilateral;  Induced for HTN  . Lymph node dissection  2010    left breast; 2 wks after breast lumpectomy    Allergies  Allergies  Allergen Reactions  . Aspirin Shortness Of Breath and Palpitations    Pt can take ibuprofen without reaction  . Doxycycline Anaphylaxis  . Penicillins Shortness Of Breath and Palpitations  . Kiwi Extract Itching and Swelling    Lip swelling  . Strawberry Hives    HPI  38 year old female with h/o asthma, obesity, hypertension, who is coming with complaints of chest pain. This pleasant  young female has a history of locally advanced left breast carcinoma diagnosed in 2010 and treated with neoadjuvant chemotherapy with docetaxel and cyclophosphamide as well as paclitaxel. This was followed by radiation therapy which was completed in 2011. The patient complains of chest pain it is not exertional, it is locally located in the retrosternal and left epigastric region. It has been happening anytime of the day, and can last for minutes. There is no clear aggravating or alleviating factor. The patient states that ever since her chemotherapy she gets short of breath with minimal activity and states that she will be able to walk a block.  Follow up after 4 month, no change in her symptoms, she still has exertional shortness of breath and fatigue. She also has a paroxysmal chest pain "heart squeezing" associated with SOB, alleviated by lorazepam and inhalors.   09/27/2013 - the patient is coming complaining of continuous daily wheezing, and nonexertional right upper quadrant pain that force her to go to the ER. A complete workup including abdominal ultrasound and CAT scan was performed. The only finding was fatty liver no acute pathology in the gallbladder no gallstones. The patient had an upper EGD last year where her gastroparesis was diagnosed and she was started on metoclopramide and proton next. Her symptoms improved however she continues having episodes of this pain. She feels overall very tired  and short of breath and complaints of orthopnea.  11/30/2013 - we stopped Bystolic at the last visit as it caused wheezing, her wheezing has improved but she feels more SOB. She is trying to exercise but gets SOB easily.   03/14/2014 -  The patient is undergoing cardiac rehabilitation and has experienced at least 2 episodes of syncope that are not related to exertion.  One of her episodes was while she was wearing Holter monitor and strips only showed sinus rhythm. The patient is being evaluated for  sleep apnea in January.She continues going to cardiac rehabilitation and enjoys it very well she overall is feels better.She brings her blood pressure log and her blood pressure  Are in the range 16-967 for systolic blood pressure. She also complains of lower extremity edema that happens on certain days.  Home Medications  Prior to Admission medications   Medication Sig Start Date End Date Taking? Authorizing Provider  albuterol (PROVENTIL HFA;VENTOLIN HFA) 108 (90 BASE) MCG/ACT inhaler Inhale 2 puffs into the lungs every 4 (four) hours as needed for wheezing or shortness of breath (((PLAN A))).     Historical Provider, MD  albuterol (PROVENTIL) (2.5 MG/3ML) 0.083% nebulizer solution Take 2.5 mg by nebulization every 4 (four) hours as needed for wheezing or shortness of breath (((PLAN B))). For shortness of breath. 05/07/12   Amy Milda Smart, PA-C  amLODipine-olmesartan (AZOR) 5-40 MG per tablet Take 1 tablet by mouth daily. 08/05/12   [name], MD  cloNIDine (CATAPRES) 0.2 MG tablet Take 1 tablet (0.2 mg total) by mouth 2 (two) times daily. 08/05/12   [name], MD  hyoscyamine (LEVSIN SL) 0.125 MG SL tablet Place 0.125 mg under the tongue every 8 (eight) hours as needed for cramping. 05/19/12   Jerene Bears, MD  LORazepam (ATIVAN) 0.5 MG tablet Take 1 tablet (0.5 mg total) by mouth at bedtime as needed for anxiety. 01/07/13   Chauncey Cruel, MD  metoCLOPramide (REGLAN) 10 MG tablet Take 1 tablet (10 mg total) by mouth 4 (four) times daily -  with meals and at bedtime. 01/14/13   Jerene Bears, MD  mometasone-formoterol (DULERA) 100-5 MCG/ACT AERO Inhale 2 puffs into the lungs 2 (two) times daily. 05/07/12   Amy Milda Smart, PA-C  montelukast (SINGULAIR) 10 MG tablet Take 1 tablet (10 mg total) by mouth at bedtime. 12/31/11   Chauncey Cruel, MD  nebivolol (BYSTOLIC) 10 MG tablet Take 10 mg by mouth at bedtime.    Historical Provider, MD  pantoprazole (PROTONIX) 40 MG tablet Take 1 tablet (40 mg total)  by mouth daily. 01/07/13   Chauncey Cruel, MD  potassium chloride SA (K-DUR,KLOR-CON) 20 MEQ tablet Take 20 mEq by mouth 2 (two) times daily. 06/04/12   [name], MD  sucralfate (CARAFATE) 1 G tablet Take 1 tablet (1 g total) by mouth 4 (four) times daily. 03/07/12   [name], MD  traMADol Veatrice Bourbon) 50 MG tablet 1-2 tabs by mouth every 4 hours as needed for cough or pain 09/10/12   Amy Milda Smart, PA-C  triamterene-hydrochlorothiazide (MAXZIDE-25) 37.5-25 MG per tablet Take 1 tablet by mouth every morning.    Historical Provider, MD    Family History  Family History  Problem Relation Age of Onset  . Diabetes Maternal Grandmother   . Heart disease Maternal Grandmother   . Hypertension Maternal Grandmother   . Cancer Neg Hx   . Alcohol abuse Neg Hx   . Early  death Neg Hx   . Hyperlipidemia Neg Hx   . Kidney disease Neg Hx   . Stroke Neg Hx   . Colon cancer Neg Hx     Social History  History   Social History  . Marital Status: Single    Spouse Name: N/A    Number of Children: 3  . Years of Education: N/A   Occupational History  .     Social History Main Topics  . Smoking status: Never Smoker   . Smokeless tobacco: Never Used  . Alcohol Use: 0.0 oz/week     Comment: occasionally  . Drug Use: No  . Sexual Activity: Yes    Birth Control/ Protection: Surgical   Other Topics Concern  . Not on file   Social History Narrative   She lives with four children.   She is currently not working (disbaility pending).           Review of Systems General:  No chills, fever, night sweats or weight changes.  Cardiovascular:  No chest pain, dyspnea on exertion, edema, orthopnea, palpitations, paroxysmal nocturnal dyspnea. Dermatological: No rash, lesions/masses Respiratory: No cough, dyspnea Urologic: No hematuria, dysuria Abdominal:   No nausea, vomiting, diarrhea, bright red blood per rectum, melena, or hematemesis Neurologic:  No visual changes, wkns, changes in  mental status. All other systems reviewed and are otherwise negative except as noted above.  Physical Exam Blood pressure 136/82, pulse 99, height 5\' 7"  (1.702 m), weight 205 lb (92.987 kg), SpO2 91 %.  General: Pleasant, NAD Psych: Normal affect. Neuro: Alert and oriented X 3. Moves all extremities spontaneously. HEENT: Normal  Neck: Supple without bruits or JVD. Lungs:  Resp regular and unlabored, CTA. Heart: RRR no s3, s4, or murmurs. Abdomen: Soft, non-tender, non-distended, BS + x 4.  Extremities: No clubbing, cyanosis or edema. DP/PT/Radials 2+ and equal bilaterally.  Lipid Panel     Component Value Date/Time   CHOL 172 02/03/2014 1144   TRIG 151.0* 02/03/2014 1144   HDL 32.00* 02/03/2014 1144   CHOLHDL 5 02/03/2014 1144   VLDL 30.2 02/03/2014 1144   LDLCALC 110* 02/03/2014 1144   Nuclear stress test: 02/15/2013 Impression  Exercise Capacity: Lexiscan with no exercise.  BP Response: Hypertensive blood pressure response.  Clinical Symptoms: Chest pain, headache.  ECG Impression: No significant ST segment change suggestive of ischemia.  Comparison with Prior Nuclear Study: No images to compare  Overall Impression: Normal stress nuclear study.  LV Ejection Fraction: 54%. LV Wall Motion: NL LV Function; NL Wall Motion  Loralie Champagne  02/12/2013   TTE 06/17/2013  Left ventricle: The cavity size was normal. There was mild concentric hypertrophy. Systolic function was normal. The estimated ejection fraction was in the range of 60% to 65%. Wall motion was normal; there were no regional wall motion abnormalities. Features are consistent with a pseudonormal left ventricular filling pattern, with concomitant abnormal relaxation and increased filling pressure (grade 2 diastolic dysfunction). Doppler parameters are consistent with high ventricular filling pressure. Impressions: - When compared to 2010, EF has improved.  Accessory Clinical Findings  ECG - normal sinus  rhythm, 100 beats per minute, normal EKG.    Assessment & Plan  38 year old female with prior medical history of hyperlipidemia, hypertension and breast carcinoma with chemotherapy that can potentially cause heart block, heart failure and angina.  1.  Recurrent syncope -  E-cardio monitor showed only sinus rhythm. This is possibly due to narcolepsy, she is awaiting a sleep study.  2.  Chronic diastolic CHF, associated with dyspnea on exertion - A lexiscan nuclear stress test showed poor exercise capacity, LV EF 54% and no perfusion defect.  Echocardiogram showed preserved LV function it is improved compared to prior echo however she has pseudo-normal pattern of diastolic dysfunction with elevated filling pressures.  Her blood pressure management has been number of challenging as her blood pressure is labile and can be very low as well. She will also be given a prescription for Lasix 40 mg daily when necessary for the days when her lower extremity edema occurs.  3. HTN - labile,  She is advised not to use clonidine on days when her blood pressures low. We'll check bilateral renal arterial duplex chew lot renal arterial hypertension.  4. Lipid profile - LDL 129 just recommend exercise and diet.  5. 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  [name], MD 03/14/2014, 3:39 PM

## 2014-03-14 NOTE — Patient Instructions (Addendum)
Your physician recommends that you schedule a follow-up appointment in: [place] has requested that you have a renal artery duplex. During this test, an ultrasound is used to evaluate blood flow to the kidneys. Allow one hour for this exam. Do not eat after midnight the day before and avoid carbonated beverages. Take your medications as you usually do.   Your physician has recommended you make the following change in your medication:  1) TAKE 40 MG OF LASIX DAILY, AS NEEDED  2) TAKE 20 MEQ OF POTASSIUM, ON DAY YOU TAKE LASIX

## 2014-03-16 ENCOUNTER — Encounter: Payer: Self-pay | Admitting: Internal Medicine

## 2014-03-16 ENCOUNTER — Encounter: Payer: Self-pay | Admitting: Neurology

## 2014-03-16 ENCOUNTER — Encounter (HOSPITAL_COMMUNITY)
Admission: RE | Admit: 2014-03-16 | Discharge: 2014-03-16 | Disposition: A | Discharge status: Home / Self Care | Payer: Medicaid - Out of State | Source: Ambulatory Visit | Attending: Cardiology | Admitting: Cardiology

## 2014-03-16 ENCOUNTER — Other Ambulatory Visit: Payer: Self-pay | Admitting: Internal Medicine

## 2014-03-16 DIAGNOSIS — Z5189 Encounter for other specified aftercare: Secondary | ICD-10-CM | POA: Diagnosis not present

## 2014-03-16 DIAGNOSIS — N3000 Acute cystitis without hematuria: Secondary | ICD-10-CM

## 2014-03-18 ENCOUNTER — Encounter (HOSPITAL_COMMUNITY)
Admission: RE | Admit: 2014-03-18 | Discharge: 2014-03-18 | Disposition: A | Discharge status: Home / Self Care | Payer: Medicaid - Out of State | Source: Ambulatory Visit | Attending: Cardiology | Admitting: Cardiology

## 2014-03-18 ENCOUNTER — Ambulatory Visit (HOSPITAL_COMMUNITY): Payer: Medicare Other | Attending: Cardiovascular Disease | Admitting: *Deleted

## 2014-03-18 ENCOUNTER — Encounter: Payer: Self-pay | Admitting: Cardiology

## 2014-03-18 ENCOUNTER — Other Ambulatory Visit: Payer: Medicaid - Out of State

## 2014-03-18 DIAGNOSIS — Z5189 Encounter for other specified aftercare: Secondary | ICD-10-CM | POA: Diagnosis not present

## 2014-03-18 DIAGNOSIS — N3000 Acute cystitis without hematuria: Secondary | ICD-10-CM

## 2014-03-18 DIAGNOSIS — I10 Essential (primary) hypertension: Secondary | ICD-10-CM | POA: Diagnosis not present

## 2014-03-18 NOTE — Progress Notes (Signed)
Renal Artery Duplex performed

## 2014-03-20 ENCOUNTER — Encounter: Payer: Self-pay | Admitting: Internal Medicine

## 2014-03-20 LAB — CULTURE, URINE COMPREHENSIVE
COLONY COUNT: NO GROWTH
ORGANISM ID, BACTERIA: NO GROWTH

## 2014-03-21 ENCOUNTER — Other Ambulatory Visit: Payer: Self-pay | Admitting: Adult Health

## 2014-03-21 ENCOUNTER — Encounter (HOSPITAL_COMMUNITY)
Admission: RE | Admit: 2014-03-21 | Discharge: 2014-03-21 | Disposition: A | Discharge status: Home / Self Care | Payer: Medicaid - Out of State | Source: Ambulatory Visit | Attending: Cardiology | Admitting: Cardiology

## 2014-03-21 DIAGNOSIS — Z5189 Encounter for other specified aftercare: Secondary | ICD-10-CM | POA: Diagnosis not present

## 2014-03-22 ENCOUNTER — Telehealth: Payer: Self-pay | Admitting: Internal Medicine

## 2014-03-22 ENCOUNTER — Telehealth: Payer: Self-pay | Admitting: *Deleted

## 2014-03-22 ENCOUNTER — Encounter: Payer: Self-pay | Admitting: Cardiology

## 2014-03-22 MED ORDER — HYDROCODONE-HOMATROPINE 5-1.5 MG/5ML PO SYRP: 5.0000 mL | ORAL_SOLUTION | Freq: Four times a day (QID) | ORAL | Status: DC | PRN

## 2014-03-22 NOTE — Telephone Encounter (Signed)
Informed the pt of normal e-cardio monitor per Dr Meda Coffee.  Informed the pt that per Dr Meda Coffee there is no arrhythmias noted and no cardiac reason for syncope identified. Pt verbalized understanding.

## 2014-03-22 NOTE — Telephone Encounter (Signed)
Really needs ov with all  meds in hand but if declines or can't arrange ok x 4 oz

## 2014-03-22 NOTE — Telephone Encounter (Signed)
Pt requesting refill on hydromet cough syrup.  States she has a prod cough with clear mucus worse at night when laying down.  Last filled 01/26/14.  Pt will pick up med from office.  Dr. Melvyn Novas are you ok with this refill?  Thanks!

## 2014-03-22 NOTE — Telephone Encounter (Signed)
Appt scheduled for 04/04/14 at 3pm with MW - pt aware to bring all meds to OV. Pt is going to be out of town until after the new year.  Hydromet Rx printed for 4oz - will have MW sign in AM. Please advise Dr Melvyn Novas.  Will send message to [facility] to follow up on-pt to pick up in AM.  Nothing further needed.

## 2014-03-23 ENCOUNTER — Encounter (HOSPITAL_COMMUNITY): Payer: Self-pay

## 2014-03-23 ENCOUNTER — Encounter (HOSPITAL_COMMUNITY)
Admission: RE | Admit: 2014-03-23 | Discharge: 2014-03-23 | Disposition: A | Discharge status: Home / Self Care | Payer: Medicaid - Out of State | Source: Ambulatory Visit | Attending: Cardiology | Admitting: Cardiology

## 2014-03-23 DIAGNOSIS — Z5189 Encounter for other specified aftercare: Secondary | ICD-10-CM | POA: Diagnosis not present

## 2014-03-23 NOTE — Progress Notes (Signed)
Pt graduated from cardiac rehab program today after completing  12 weeks of exercise sessions  in Phase II. Pt maintained average attendance, pt absences were due to illness. Pt  progressed nicely during her  participation in rehab as evidenced by increased MET level.   Medication list reconciled. Repeat  PHQ score-14 down from 16 at start of program. Pt quality of life scores were significantly improved especially in health/functioning category.  Pt relates this to her improved physical conditioning as a result of exercise and rehab participation.   Pt has made significant lifestyle changes and should be commended for her success. Pt feels she has achieved his goals during cardiac rehab, feels that she overcame several physical and psychological barriers during her rehab participation.   Pt plans to continue exercising on her own using weights and walking.

## 2014-04-04 ENCOUNTER — Ambulatory Visit (INDEPENDENT_AMBULATORY_CARE_PROVIDER_SITE_OTHER): Payer: Medicaid - Out of State | Admitting: Internal Medicine

## 2014-04-04 ENCOUNTER — Encounter: Payer: Self-pay | Admitting: Internal Medicine

## 2014-04-04 ENCOUNTER — Ambulatory Visit (INDEPENDENT_AMBULATORY_CARE_PROVIDER_SITE_OTHER)
Admission: RE | Admit: 2014-04-04 | Discharge: 2014-04-04 | Disposition: A | Discharge status: Home / Self Care | Payer: Self-pay | Source: Ambulatory Visit | Attending: Internal Medicine | Admitting: Internal Medicine

## 2014-04-04 ENCOUNTER — Other Ambulatory Visit (INDEPENDENT_AMBULATORY_CARE_PROVIDER_SITE_OTHER): Payer: Self-pay

## 2014-04-04 VITALS — BP 130/70 | HR 90 | Ht 67.0 in | Wt 206.0 lb

## 2014-04-04 DIAGNOSIS — J4540 Moderate persistent asthma, uncomplicated: Secondary | ICD-10-CM

## 2014-04-04 DIAGNOSIS — R05 Cough: Secondary | ICD-10-CM

## 2014-04-04 DIAGNOSIS — R059 Cough, unspecified: Secondary | ICD-10-CM

## 2014-04-04 LAB — CBC WITH DIFFERENTIAL/PLATELET
BASOS ABS: 0 10*3/uL (ref 0.0–0.1)
Basophils Relative: 0.5 % (ref 0.0–3.0)
EOS ABS: 0.2 10*3/uL (ref 0.0–0.7)
Eosinophils Relative: 2.1 % (ref 0.0–5.0)
HCT: 38.2 % (ref 36.0–46.0)
Hemoglobin: 12.3 g/dL (ref 12.0–15.0)
Lymphocytes Relative: 25.5 % (ref 12.0–46.0)
Lymphs Abs: 1.9 10*3/uL (ref 0.7–4.0)
MCHC: 32.2 g/dL (ref 30.0–36.0)
MCV: 90.4 fl (ref 78.0–100.0)
MONO ABS: 0.5 10*3/uL (ref 0.1–1.0)
Monocytes Relative: 7.5 % (ref 3.0–12.0)
NEUTROS PCT: 64.4 % (ref 43.0–77.0)
Neutro Abs: 4.7 10*3/uL (ref 1.4–7.7)
PLATELETS: 218 10*3/uL (ref 150.0–400.0)
RBC: 4.22 Mil/uL (ref 3.87–5.11)
RDW: 15 % (ref 11.5–15.5)
WBC: 7.3 10*3/uL (ref 4.0–10.5)

## 2014-04-04 MED ORDER — PREDNISONE 10 MG PO TABS: ORAL_TABLET | ORAL | Status: DC

## 2014-04-04 NOTE — Progress Notes (Signed)
Subjective:    Patient ID: Erica [name], female    DOB: [birth year]   MRN: [id number]    Brief patient profile:  52 yobf with asthma all her life much worse since around 07/2010 referred by Dr [name] to pulmonary clinic in Sept 2012 Hx of Breast Cancer 05/2008    History of Present Illness  12/18/2010 Initial pulmonary office eval on acei and  advair cc dtc asthma x 4 months but baseline = excess saba use "even when better" at least twice daily despite daily advair, worse at hs in fact  used saba 3 x last night.  On chemo /RT for breast ca but last rx was Jan 2012 well before asthma worse.  To er multiple times >  no better with singular added x 3 months. rec Stop Lisinopril Dulera 200 Take 2 puffs first thing in am and then another 2 puffs about 12 hours later.  Work on inhaler technique  Nexium 40 mg   Take 30-60 min before first meal of the day and Pepcid 20 mg one bedtime until return Only use your albuterol as a rescue medication to be used if you can't catch your breath by resting or doing a relaxed purse lip breathing pattern. The less you use it, the better it will work when you need it.  Prednisone 10 mg take  4 each am x 2 days,   2 each am x 2 days,  1 each am x2days and stop    01/03/2011 f/u ov/Erica [name] cc much better off ace and advair but still needing saba twice daily on avg. No  Purulent sputum. rec no change in rx/ rule of 2's reviewed       09/11/2012 f/u ov/Erica [name] "did fine" p prev ov then resumed advair 250  And now on normodyne Chief Complaint  Patient presents with  . Acute Visit    Pt c/o increased cough and SOBfor the past 2 wks. Cough is occ prod with minimal clear sputum. Breathing is esp worse at night, waking up 2-3 times at night to use neb.   >>Prednisone 10 mg take  4 each am x 2 days,   2 each am x 2 days,  1 each am x 2 days and stop  Stop advair and normodyne bystolic 20 mg every 12 hours (2 x 10 every 12 hours)  dulera 100 Take 2 puffs first thing in am  and then another 2 puffs about 12 hours later   09/25/2012 Med calendar  Returns for 1 week follow up for med review  We reviewed all her meds .Data base down for med calendars.  She depends on samples , rx to [place] assistance program and generics -as she does not have insurance.  We discussed drug assistance program for [facility].  Will need to change bystolic to bisoprolol for cost issues.  Last visit labs revealed neg d dimer and bnp. CXR w/out acute process.  She feels about the same still gets DOE . Occasional cough and wheezing on/off.  No fever or discolored mucus.  Lives part-time in [facility].  rec Stop Ipratropium Neb.  Continue on current regimen .  We will change Bystolic to Bisoprolol 10mg  Twice daily   We will get paperwork for Drug assistance program for [facility]    01/12/2013 f/u ov/Erica [name] re: dtc asthma Chief Complaint  Patient presents with  . Follow-up    PFT done today.   Even on prednisone still need albuterol hfa avg once at night and  neb saba also w/in 2 h of saba hfa for noct cough/ wheeze but much less sob/wheeze during the day eg with walk then worse in 2 weeks despite dulera 100 2bid.  rec Increase dulera to 200 Take 2 puffs first thing in am and then another 2 puffs about 12 hours later.  I will contact Dr Hilarie Fredrickson regarding your night time spells that don't resolve  even on the prednisone Prednisone 10 mg take  4 each am x 2 days,   2 each am x 2 days,  1 each am x 2 days and stop Please schedule a follow up office visit in 4 weeks, sooner if needed with all meds and inhalers in hand  Be sure using mucinex dm and tramadol correctly      04/06/2013 Follow up and Med Review   Returns for follow up and med review  We reviewed all her meds and organized them into a med calendar. She does have Medicaid but depends on samples. Did not bring all her meds with her today , says they are at home.  Says her breathing is the same since last ov w/ SOB, Has on /off wheezing, some  chest heaviness, prod cough with yellow mucus. Good and bad days. Wears out easily .  Denies f/c/s, nausea, vomiting.  Did not stop singulair as recommended.  rec  Follow med calendar closely and bring to each visit > did not do  We are referring you for a CPSTwith spirometry before and after> never done    07/28/2013 Follow up and Med Review  Patient returns for a followup and medication review. We reviewed all her medications organized them into a medication calendar with patient education. She has many med changes and is confused with several meds. We reviewed all the recent changes and updated her list w/ education.Called her pharmacy to verify/update.  Patient reports that she was recently seen in emergency room on April 27 due to chest pain, dizziness and asthma flare. She was given a Depo-Medrol shot, meclizine. And prednisone taper.   She says that she is feeling better. CP and dizziness resolved.  CXR w/ nad , CT head was neg . She denies any hemoptysis, chest pain, orthopnea, PND, or leg swelling. >>taper off pred.   01/26/2014 Acute OV (Asthma and Cough)  Complains of prod cough with yellow mucus (worse at night), wheezing, increased SOB, some tightness worse for last couple of weeks.  Cough has never went away.  Reports symptoms have not improved since last ov.   Denies f/c/s, n/v/d, hemoptysis, chest pain, orthopnea or edema.  Remains on Dulera Twice daily  .  Using tramadol and mucinex dm for cough with some help.  She is followed by Neurology for neuropathy d/t chemo, peeudotumor cerbri,  Hx of Breast cancer s/p lumpectomy/radiation/chemo.  rec Doxycyline 100mg  Twice daily  For 7 days  Mucinex DM Twice daily  As needed  Cough Prednisone taper over next week    04/04/2014 f/u ov/Erica [name] re: chronic asthma/ cough  Chief Complaint  Patient presents with  . Follow-up    Pt states that her cough is unchanged. She denies any new co's today.   75% better typically after  prednisone and then worse off same pattern x one year Cough worse when lie down > yellowish thick most productive in am  Worse chest  burning off ppi but no change on breathing or coughing with overt HB Not needing saba for breathing, uses neb "helps cough" once  a day avg    No obvious day to day or daytime variabilty or assoc  chest tightness, subjective wheeze overt sinus or hb symptoms. No unusual exp hx or h/o childhood pna/ asthma or knowledge of premature birth.  Sleeping ok without nocturnal  or early am exacerbation  of respiratory  c/o's or need for noct saba. Also denies any obvious fluctuation of symptoms with weather or environmental changes or other aggravating or alleviating factors except as outlined above   Current Medications, Allergies, Complete Past Medical History, Past Surgical History, Family History, and Social History were reviewed in [employer] record.  ROS  The following are not active complaints unless bolded sore throat, dysphagia, dental problems, itching, sneezing,  nasal congestion or excess/ purulent secretions, ear ache,   fever, chills, sweats, unintended wt loss, pleuritic or exertional cp, hemoptysis,  orthopnea pnd or leg swelling, presyncope, palpitations, heartburn, abdominal pain, anorexia, nausea, vomiting, diarrhea  or change in bowel or urinary habits, change in stools or urine, dysuria,hematuria,  rash, arthralgias, visual complaints, headache, numbness weakness or ataxia or problems with walking or coordination,  change in mood/affect or memory.                Objective:   Physical Exam  Obese bf nad    Wt 213  12/18/10 > 01/03/2011  214 > 01/31/2011  206 >  09/11/2012 209 >208 6/19 > 210 09/25/2012 > 11/20/2012 211 >208 12/07/12> 01/12/2013 209 > 02/09/2013 210 >207 04/06/2013 > 07/14/2013  213>212 07/28/2013 >204 01/26/2014> 04/04/2014 206    HEENT: nl dentition, turbinates, and orophanx. Nl external ear canals without cough  reflex   NECK :  without JVD/Nodes/TM/ nl carotid upstrokes bilaterally   LUNGS: lungs clear bilaterally with good air movement/ mild pseudowheeze   CV:  RRR  no s3 or murmur or increase in P2, no edema   ABD:  soft and nontender with nl excursion in the supine position. No bruits or organomegaly, bowel sounds nl  MS:  warm without deformities, calf tenderness, cyanosis or clubbing   cxr 04/04/14 Enlargement of cardiac silhouette and minimal chronic bronchitic changes. No acute abnormalities.

## 2014-04-04 NOTE — Patient Instructions (Addendum)
Change dulera to 100 dosed Take 2 puffs first thing in am and then another 2 puffs about 12 hours later.   For cough use mucinex dm up to 1200 mg every 12 hours and use the flutter valve as much as possible  If still coughing add tramadol 50 mg up to 2 every 4 yours  Change  nasonex  to one twice daily   Please remember to go to the lab and x-ray department downstairs for your tests - we will call you with the results when they are available.     See Tammy NP w/in 2 weeks with all your medications, even over the counter meds, separated in two separate bags, the ones you take no matter what vs the ones you stop once you feel better and take only as needed when you feel you need them.   Tammy  will generate for you a new user friendly medication calendar that will put [place] all on the same page re: your medication use.     Without this process, it simply isn't possible to assure that we are providing  your outpatient care  with  the attention to detail we feel you deserve.   If we cannot assure that you're getting that kind of care,  then we cannot manage your problem effectively from this clinic.  Once you have seen Tammy and we are sure that we're all on the same page with your medication use she will arrange follow up with me.  Late add : one sample of the dulera 100 given, check the count on return

## 2014-04-05 ENCOUNTER — Encounter: Payer: Self-pay | Admitting: Internal Medicine

## 2014-04-05 LAB — ALLERGY FULL PROFILE
Allergen, D pternoyssinus,d7: <0.1 kU/L
Bahia Grass: 0.29 kU/L — ABNORMAL HIGH
Bermuda Grass: 0.16 kU/L — ABNORMAL HIGH
Box Elder IgE: <0.1 kU/L
COMMON RAGWEED: 0.17 kU/L — AB
Candida Albicans: 0.32 kU/L — ABNORMAL HIGH
Curvularia lunata: <0.1 kU/L
Dog Dander: <0.1 kU/L
Elm IgE: <0.1 kU/L
Fescue: 0.33 kU/L — ABNORMAL HIGH
G005 Rye, Perennial: 0.33 kU/L — ABNORMAL HIGH
G009 RED TOP: 0.38 kU/L — AB
Goldenrod: <0.1 kU/L
Helminthosporium halodes: <0.1 kU/L
House Dust Hollister: 0.19 kU/L — ABNORMAL HIGH
IgE (Immunoglobulin E), Serum: 75 kU/L (ref <115)
Lamb's Quarters: <0.1 kU/L
Plantain: <0.1 kU/L
Stemphylium Botryosum: <0.1 kU/L
TIMOTHY GRASS: 0.34 kU/L — AB

## 2014-04-05 NOTE — Assessment & Plan Note (Signed)
The most common causes of chronic cough in immunocompetent adults include the following: upper airway cough syndrome (UACS), previously referred to as postnasal drip syndrome (PNDS), which is caused by variety of rhinosinus conditions; (2) asthma; (3) GERD; (4) chronic bronchitis from cigarette smoking or other inhaled environmental irritants; (5) nonasthmatic eosinophilic bronchitis; and (6) bronchiectasis.   These conditions, singly or in combination, have accounted for up to 94% of the causes of chronic cough in prospective studies.   Other conditions have constituted no >6% of the causes in prospective studies These have included bronchogenic carcinoma, chronic interstitial pneumonia, sarcoidosis, left ventricular failure, ACEI-induced cough, and aspiration from a condition associated with pharyngeal dysfunction.    Chronic cough is often simultaneously caused by more than one condition. A single cause has been found from 38 to 82% of the time, multiple causes from 18 to 62%. Multiply caused cough has been the result of three diseases up to 42% of the time.       Based on hx and exam, this is most likely: cough variant asthma vs    Upper airway cough syndrome, so named because it's frequently impossible to sort out how much is  CR/sinusitis with freq throat clearing (which can be related to primary GERD)   vs  causing  secondary (" extra esophageal")  GERD from wide swings in gastric pressure that occur with throat clearing, often  promoting self use of mint and menthol lozenges that reduce the lower esophageal sphincter tone and exacerbate the problem further in a cyclical fashion.   These are the same pts (now being labeled as having "irritable larynx syndrome" by some cough centers) who not infrequently have a history of having failed to tolerate ace inhibitors,  dry powder inhalers or biphosphonates or report having atypical reflux symptoms that don't respond to standard doses of PPI , and are  easily confused as having aecopd or asthma flares by even experienced allergists/ pulmonologists.   The first step is to continue to maximize acid suppression and eliminate cyclical coughing while exploring the possibility of occult sinus dz/ allergy with sinus ct and allergy profile and try dulera 100 2bid instead of the 200 to see if cough better or worse.  If worse would strongly indicate asthma as cause of cough.  See instructions for specific recommendations which were reviewed directly with the patient who was given a copy with highlighter outlining the key components.

## 2014-04-05 NOTE — Progress Notes (Signed)
Quick Note:  Spoke with pt and notified of results per Dr. Wert. Pt verbalized understanding and denied any questions.  ______ 

## 2014-04-05 NOTE — Progress Notes (Signed)
Quick Note:  ATC, NA and no option to leave msg on either number, WCB ______

## 2014-04-05 NOTE — Assessment & Plan Note (Signed)
-   PFT's 01/31/2011 FEV1  1.76 (60% ) ratio 68 and no better p B2,  DLCO 70% -med review 09/25/2012 > Dulera drug assistance  paperwork given    - HFA 90% 11/20/2012 .  -Med calendar 12/07/12 , 04/06/2013  - PFT's 01/12/2013  1.73 (65%)  Ratio 73 and no better p B2, DLCO 86% - trial off singulair 02/08/13 >>>did not stop  - 04/05/2014  trial of dulera 100 instead of 200 with full sample given and f/u 2 weeks plan to check if taking it and whether helped/ hurt  DDX of  difficult airways management all start with A and  include Adherence, Ace Inhibitors, Acid Reflux, Active Sinus Disease, Alpha 1 Antitripsin deficiency, Anxiety masquerading as Airways dz,  ABPA,  allergy(esp in young), Aspiration (esp in elderly), Adverse effects of DPI,  Active smokers, plus two Bs  = Bronchiectasis and Beta blocker use..and one C= CHF  Adherence is always the initial "prime suspect" and is a multilayered concern that requires a "trust but verify" approach in every patient - starting with knowing how to use medications, especially inhalers, correctly, keeping up with refills and understanding the fundamental difference between maintenance and prns vs those medications only taken for a very short course and then stopped and not refilled.  - The proper method of use, as well as anticipated side effects, of a metered-dose inhaler are discussed and demonstrated to the patient. Improved effectiveness after extensive coaching during this visit to a level of approximately  90% from a baseline of 50% so should do just as well on the 100 dose - return for med rec next and make sure she's using the dulera  ? Acid (or non-acid) GERD > always difficult to exclude as up to 75% of pts in some series report no assoc GI/ Heartburn symptoms> rec continue max (24h)  acid suppression and diet restrictions/ reviewed     See instructions for specific recommendations which were reviewed directly with the patient who was given a copy with highlighter  outlining the key components.

## 2014-04-07 ENCOUNTER — Ambulatory Visit (HOSPITAL_BASED_OUTPATIENT_CLINIC_OR_DEPARTMENT_OTHER): Payer: Self-pay | Admitting: Oncology

## 2014-04-07 ENCOUNTER — Telehealth: Payer: Self-pay | Admitting: Oncology

## 2014-04-07 ENCOUNTER — Other Ambulatory Visit (HOSPITAL_BASED_OUTPATIENT_CLINIC_OR_DEPARTMENT_OTHER): Payer: Self-pay

## 2014-04-07 VITALS — BP 161/87 | HR 103 | Temp 99.5°F | Resp 18 | Ht 67.0 in | Wt 203.6 lb

## 2014-04-07 DIAGNOSIS — I5032 Chronic diastolic (congestive) heart failure: Secondary | ICD-10-CM

## 2014-04-07 DIAGNOSIS — L732 Hidradenitis suppurativa: Secondary | ICD-10-CM

## 2014-04-07 DIAGNOSIS — N938 Other specified abnormal uterine and vaginal bleeding: Secondary | ICD-10-CM | POA: Insufficient documentation

## 2014-04-07 DIAGNOSIS — C50412 Malignant neoplasm of upper-outer quadrant of left female breast: Secondary | ICD-10-CM

## 2014-04-07 DIAGNOSIS — N178 Other acute kidney failure: Secondary | ICD-10-CM

## 2014-04-07 DIAGNOSIS — K219 Gastro-esophageal reflux disease without esophagitis: Secondary | ICD-10-CM

## 2014-04-07 DIAGNOSIS — Z5111 Encounter for antineoplastic chemotherapy: Secondary | ICD-10-CM

## 2014-04-07 DIAGNOSIS — I10 Essential (primary) hypertension: Secondary | ICD-10-CM

## 2014-04-07 LAB — CBC WITH DIFFERENTIAL/PLATELET
BASO%: 1.2 % (ref 0.0–2.0)
BASOS ABS: 0.1 10*3/uL (ref 0.0–0.1)
EOS%: 1.2 % (ref 0.0–7.0)
Eosinophils Absolute: 0.1 10*3/uL (ref 0.0–0.5)
HEMATOCRIT: 41.2 % (ref 34.8–46.6)
HEMOGLOBIN: 13.1 g/dL (ref 11.6–15.9)
LYMPH%: 20.3 % (ref 14.0–49.7)
MCH: 28.7 pg (ref 25.1–34.0)
MCHC: 31.8 g/dL (ref 31.5–36.0)
MCV: 90.2 fL (ref 79.5–101.0)
MONO#: 0.4 10*3/uL (ref 0.1–0.9)
MONO%: 5.9 % (ref 0.0–14.0)
NEUT#: 5.3 10*3/uL (ref 1.5–6.5)
NEUT%: 71.4 % (ref 38.4–76.8)
Platelets: 218 10*3/uL (ref 145–400)
RBC: 4.57 10*6/uL (ref 3.70–5.45)
RDW: 14.8 % — ABNORMAL HIGH (ref 11.2–14.5)
WBC: 7.4 10*3/uL (ref 3.9–10.3)
lymph#: 1.5 10*3/uL (ref 0.9–3.3)

## 2014-04-07 LAB — COMPREHENSIVE METABOLIC PANEL (CC13)
ALT: 17 U/L (ref 0–55)
AST: 16 U/L (ref 5–34)
Albumin: 3.8 g/dL (ref 3.5–5.0)
Alkaline Phosphatase: 57 U/L (ref 40–150)
Anion Gap: 8 mEq/L (ref 3–11)
BILIRUBIN TOTAL: 0.6 mg/dL (ref 0.20–1.20)
BUN: 10.9 mg/dL (ref 7.0–26.0)
CO2: 23 meq/L (ref 22–29)
CREATININE: 1 mg/dL (ref 0.6–1.1)
Calcium: 9.2 mg/dL (ref 8.4–10.4)
Chloride: 110 mEq/L — ABNORMAL HIGH (ref 98–109)
EGFR: 82 mL/min/{1.73_m2} — AB (ref >90)
Glucose: 102 mg/dl (ref 70–140)
Potassium: 3.3 mEq/L — ABNORMAL LOW (ref 3.5–5.1)
Sodium: 141 mEq/L (ref 136–145)
Total Protein: 7.4 g/dL (ref 6.4–8.3)

## 2014-04-07 MED ORDER — GOSERELIN ACETATE 3.6 MG ~~LOC~~ IMPL
3.6000 mg | DRUG_IMPLANT | Freq: Once | SUBCUTANEOUS | Status: AC
  Administered 2014-04-07: 3.6 mg via SUBCUTANEOUS
  Filled 2014-04-07: qty 3.6

## 2014-04-07 NOTE — Progress Notes (Signed)
ID: Erica [name]   DOB: 01/22/1976  MR#: [id number]  EZM#:[id number]  TKP:[id number] [name], MD GYN: SU:  Erroll Luna, MD Other:  Christinia Gully, MD;  Zenovia Jarred, MD;  Ena Dawley, MD  CHIEF COMPLAINT:  Triple negative Breast Cancer  CURRENT TREATMENT: Observation  BREAST CANCER HISTORY: From the original intake note:  Erica [name] noticed a lump in her left breast sometime in August or September 2009.  Initially this was thought to possibly be "dry breast milk" but as the mass got bigger, she brought it to her physician's attention and was set up for mammography.  This was performed May 20, 2008 at [facility] and it showed a palpable 8.5 cm oval circumscribed mass in the left breast upper outer quadrant.  There were prominent left axillary lymph nodes noted as well.  An ultrasound showed this to be hypoechoic, with lobulated margins and probable central necrosis measuring up to 7.3 cm on this modality.  Ultrasound of the left axilla demonstrated numerous prominent lymph nodes at the upper limit of normal, the largest measuring 1 cm with a normal appearing thin cortex and a normal cortex to halo ratio.    Because the patient had no insurance, she was referred to [facility] and biopsy was performed in [place] with the pathology there ([id number]) showing a poorly differentiated malignancy which was triple negative (ER 1%, PR 0%, HER-2/neu 0).  The axillary lymph nodes were not biopsied. With this information, the patient was presented at the Breast Multidisciplinary Conference and the feeling was that, after appropriate staging, the patient would be a good candidate for neoadjuvant treatment and possibly NSABP B40.    Her subsequent history is as detailed below  INTERVAL HISTORY: Erica [name] returns today for follow-up of her breast cancer. She continues to work on her heart problems and just "graduated" from Cardiac rehab. She is also working closely with Dr Melvyn Novas regarding lung issues. She was  supposed to have had bilateral mammography last week, but she says she was never called.  REVIEW OF SYSTEMS: Erica [name] feels tired all the time. She just has no energy. She doesn't have any chest pressure or pain. She coughs "a lot", but is not bringing up any phlegm and certainly no blood. She denies pleurisy. She is having normal bowel movements. She has good bladder control. She tells me her appetite is poor, she has problems with nausea and vomiting, and she has back pain and joint pain "all over" which makes it hard for her to walk any distance. She feels anxious and depressed. A detailed review of systems today was otherwise stable.  PAST MEDICAL HISTORY: Past Medical History  Diagnosis Date  . Breast cancer 2010    a. locally advanced left breast carcinoma diagnosed in 2010 and treated with neoadjuvant chemotherapy with docetaxel and cyclophosphamide as well as paclitaxel. This was followed by radiation therapy which was completed in 2011.  . Asthma   . Hypertension   . GERD (gastroesophageal reflux disease)   . Impaired glucose tolerance 04/13/2011  . Anemia, unspecified 04/13/2011  . Asthma 09/27/2010  . Bloating 03/18/2011  . 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 Lymphadenitis, chronic     restricted LEFT extremity  . Dyspnea on exertion     a. 01/2013 Lexi MV: EF 54%, no ischemia/infarct;  b. 05/2013 Echo: EF 60-65%, no rwma, Gr 2 DD;  b.     PAST SURGICAL HISTORY: Past Surgical History  Procedure Laterality Date  . Hand surgery  2012  right; tendon release  . Breast lumpectomy  09/2008    left  . Carpal tunnel release Left 2011  . Tubal ligation  05/11/2011    Procedure: POST PARTUM TUBAL LIGATION;  Surgeon: Emeterio Reeve, MD;  Location: [facility];  Service: Gynecology;  Laterality: Bilateral;  Induced for HTN  . Lymph node dissection  2010    left breast; 2 wks after breast lumpectomy    FAMILY HISTORY Family History  Problem Relation Age of Onset  . Diabetes Maternal  Grandmother   . Heart disease Maternal Grandmother   . Hypertension Maternal Grandmother   . Cancer Neg Hx   . Alcohol abuse Neg Hx   . Early death Neg Hx   . Hyperlipidemia Neg Hx   . Kidney disease Neg Hx   . Stroke Neg Hx   . Colon cancer Neg Hx   The patient's parents are alive. The patient has one brother and one sister.  There is no ovarian or breast cancer in the immediate family, but the patient's maternal grandmother, who was one of 41 sisters, had one sister with breast cancer diagnosed in her 26.    GYNECOLOGIC HISTORY: updated January 2015 The patient is GX P4, first pregnancy to term at age 2.  She stopped having periods at the time of chemotherapy, and had had no periods for  2 years, then became pregnant in 2012. This was a surprise to her, and she was not aware of the pregnancy until the seventh month. She has not resumed normal menses, but has had some "spotting" on and off for the last several months.  SOCIAL HISTORY: updated  January 2015  She has been a Dance movement psychotherapist in the past. She's currently disabled. she splits her time between [place] and [place], [place]. She's currently single. Erica [name] has four children,  currently 62 year old, 29 years old, 72 and 41 years old. Her oldest daughter is attending college in [place].   Her youngest son is almost 2. 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 Smoking status: Never Smoker   . Smokeless tobacco: Never Used  . Alcohol Use: 0.0 oz/week     Comment: occasionally     Colonoscopy: Never  PAP: June 2013  Bone density:  Never  Lipid panel: Nov 2014, Dr. Meda Coffee   Allergies  Allergen Reactions  . Aspirin Shortness Of Breath and Palpitations    Pt can take ibuprofen without reaction  . Doxycycline Anaphylaxis  . Penicillins Shortness Of Breath and Palpitations  . Kiwi Extract Itching and Swelling    Lip swelling  .  Strawberry Hives    Current Outpatient Prescriptions  Medication Sig Dispense Refill  . acetaZOLAMIDE (DIAMOX) 250 MG tablet Take 2 tab in the morning, 1 tab in afternoon, 2 tab at bedtime. (Patient taking differently: Take 2 tab in the morning, 2 tab in afternoon, 2 tab at bedtime.) 150 tablet 5  . albuterol (PROVENTIL HFA;VENTOLIN HFA) 108 (90 BASE) MCG/ACT inhaler Inhale 2 puffs into the lungs every 4 (four) hours as needed for wheezing or shortness of breath (((PLAN A))). 18 g 5  . albuterol (PROVENTIL) (2.5 MG/3ML) 0.083% nebulizer solution Take 3 mLs (2.5 mg total) by nebulization every 4 (four) hours as needed for wheezing or shortness of breath (((PLAN B))). 300 mL 5  . amLODipine-olmesartan (AZOR) 5-40 MG per tablet Take 2 tablets by mouth daily. [place]  tablet 3  . Biotin 1000 MCG tablet Take 1,000 mcg by mouth daily.    . cloNIDine (CATAPRES) 0.3 MG tablet Take 1 tablet (0.3 mg total) by mouth 2 (two) times daily. 90 tablet 3  . diltiazem (CARDIZEM CD) 240 MG 24 hr capsule Take 1 capsule (240 mg total) by mouth daily. 90 capsule 4  . diphenhydrAMINE (BENADRYL) 25 MG tablet Take 2 tablets (50 mg total) by mouth every 6 (six) hours. x4 days 32 tablet 0  . EPINEPHrine 0.3 mg/0.3 mL IJ SOAJ injection Inject 0.3 mLs (0.3 mg total) into the muscle once. 2 Device 1  . furosemide (LASIX) 40 MG tablet Take 1 tablet (40 mg total) by mouth daily as needed. 30 tablet 6  . HYDROcodone-homatropine (HYDROMET) 5-1.5 MG/5ML syrup Take 5 mLs by mouth every 6 (six) hours as needed for cough. 120 mL 0  . hyoscyamine (LEVSIN SL) 0.125 MG SL tablet Place 1 tablet (0.125 mg total) under the tongue every 8 (eight) hours as needed for cramping. 30 tablet 3  . isosorbide mononitrate (IMDUR) 60 MG 24 hr tablet Take 1 tablet (60 mg total) by mouth daily. 30 tablet 11  . lidocaine (XYLOCAINE) 5 % ointment Apply with gloves, twice daily as needed to legs. 50 g 3  . LORazepam (ATIVAN) 0.5 MG tablet Take 1 tablet (0.5 mg  total) by mouth every 8 (eight) hours as needed for anxiety. 60 tablet 2  . meclizine (ANTIVERT) 25 MG tablet Take 25 mg by mouth 3 (three) times daily as needed for dizziness.    . metoCLOPramide (REGLAN) 10 MG tablet Take 1 tablet (10 mg total) by mouth 4 (four) times daily -  with meals and at bedtime. 120 tablet 3  . mometasone (NASONEX) 50 MCG/ACT nasal spray Place 2 sprays into the nose daily. 17 g 12  . mometasone-formoterol (DULERA) 200-5 MCG/ACT AERO Inhale 2 puffs into the lungs 2 (two) times daily. 13 g 5  . montelukast (SINGULAIR) 10 MG tablet Take 1 tablet (10 mg total) by mouth at bedtime. 30 tablet 5  . nebivolol (BYSTOLIC) 5 MG tablet Take 5 mg by mouth daily.    . nitroGLYCERIN (NITROSTAT) 0.4 MG SL tablet Place 1 tablet (0.4 mg total) under the tongue every 5 (five) minutes as needed for chest pain. 90 tablet 3  . ondansetron (ZOFRAN-ODT) 4 MG disintegrating tablet Take 1 tablet (4 mg total) by mouth every 6 (six) hours as needed for nausea or vomiting. 20 tablet 5  . pantoprazole (PROTONIX) 40 MG tablet Take 1 tablet (40 mg total) by mouth 2 (two) times daily before a meal. 60 tablet 3  . Phenylephrine-APAP-Guaifenesin (MUCINEX SINUS-MAX CONGESTION PO) Take 1 tablet by mouth 2 (two) times daily as needed.    . potassium chloride (K-DUR,KLOR-CON) 20 MEQ tablet Take 1 tablet by mouth on days you take Lasix. (Patient taking differently: 20 mEq 2 (two) times daily. Take extra 20 meq tablet with lasix) 30 tablet 6  . predniSONE (DELTASONE) 10 MG tablet Take  4 each am x 2 days,   2 each am x 2 days,  1 each am x 2 days and stop 14 tablet 0  . pregabalin (LYRICA) 200 MG capsule Take 1 capsule (200 mg total) by mouth 3 (three) times daily. 90 capsule 5  . sucralfate (CARAFATE) 1 G tablet Take 1 tablet (1 g total) by mouth 4 (four) times daily. (Patient not taking: Reported on 04/04/2014) 30 tablet 0   Current Facility-Administered  Medications  Medication Dose Route Frequency Provider Last  Rate Last Dose  . goserelin (ZOLADEX) injection 3.6 mg  3.6 mg Subcutaneous Once Chauncey Cruel, MD        OBJECTIVE:  Young African-American woman in no acute distress Filed Vitals:   04/07/14 1436  BP: 161/87  Pulse: 103  Temp: 99.5 F (37.5 C)  Resp: 18     Body mass index is 31.88 kg/(m^2).    ECOG FS: 2 Filed Weights   04/07/14 1436  Weight: 203 lb 9.6 oz (92.352 kg)    Sclerae unicteric, pupils equal and reactive Oropharynx clear and moist-- no thrush or other lesions No cervical or supraclavicular adenopathy Lungs no rales or rhonchi, no wheezes noted Heart regular rate and rhythm Abd soft, obese, nontender, positive bowel sounds MSK no focal spinal tenderness, no upper extremity lymphedema Neuro: nonfocal, well oriented, appropriate affect Breasts: The right breast is unremarkable. The left breast is status post lumpectomy and radiation. There is no evidence of local recurrence. The left axilla is benign.  LAB RESULTS: Lab Results  Component Value Date   WBC 7.4 04/07/2014   NEUTROABS 5.3 04/07/2014   HGB 13.1 04/07/2014   HCT 41.2 04/07/2014   MCV 90.2 04/07/2014   PLT 218 04/07/2014      Chemistry      Component Value Date/Time   NA 141 04/07/2014 1411   NA 137 02/18/2014 1517   K 3.3* 04/07/2014 1411   K 3.3* 02/18/2014 1517   CL 112 02/18/2014 1517   CL 103 09/10/2012 1618   CO2 23 04/07/2014 1411   CO2 19 02/18/2014 1517   BUN 10.9 04/07/2014 1411   BUN 11 02/18/2014 1517   CREATININE 1.0 04/07/2014 1411   CREATININE 1.0 02/18/2014 1517   CREATININE 0.64 05/02/2011 1021      Component Value Date/Time   CALCIUM 9.2 04/07/2014 1411   CALCIUM 8.4 02/18/2014 1517   ALKPHOS 57 04/07/2014 1411   ALKPHOS 43 09/15/2013 0702   AST 16 04/07/2014 1411   AST 24 09/15/2013 0702   ALT 17 04/07/2014 1411   ALT 32 09/15/2013 0702   BILITOT 0.60 04/07/2014 1411   BILITOT <0.2* 09/15/2013 0702        STUDIES: Dg Chest 2 View  04/04/2014    CLINICAL DATA:  Chronic cough, shortness of breath for 1 year, hypertension, asthma, CHF, breast cancer  EXAM: CHEST  2 VIEW  COMPARISON:  09/15/2013  FINDINGS: Mild enlargement of cardiac silhouette.  Mediastinal contours and pulmonary vascularity normal.  Minimal chronic peribronchial thickening without infiltrate, pleural effusion or pneumothorax.  No acute osseous findings.  IMPRESSION: Enlargement of cardiac silhouette and minimal chronic bronchitic changes.  No acute abnormalities.   Electronically Signed   By: Lavonia Dana M.D.   On: 04/04/2014 16:34     ASSESSMENT: 39 y.o. [place] woman with history of locally advanced left breast carcinoma initially presenting in March 2010   (1) treated neoadjuvantly with 4 cycles of docetaxel/ cyclophosphamide.    (2) status post left lumpectomy and sentinel lymph node dissection in July 2010 for a ypT2 ypN1, stage IIB invasive ductal carcinoma, grade 3, triple negative, with MIB-1 of 88%.   (3) status post full axillary dissection and margin clearance in August 2010.   (4) status post 11 doses of weekly paclitaxel completed in December 2010   (5) radiation therapy completed in March 2011.  Now followed with observation alone.   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 She is working actively on those. As far as breast cancer is concerned, there is no history of disease activity or recurrence.  She was supposed to have had bilateral mammography last week but for some reason that was not done. Those orders were reentered. I will let her know if there are any abnormalities, although exam today was negative.  She is having significant problems from dysfunctional uterine bleeding. She is interested in receiving Zoladex monthly and we went over the fact that  this will further her into menopause. We discussed the possible symptoms of menopause. Nevertheless she is very interested in proceeding and of course this is a reversible intervention. If she can live with the menopause after 3 or 4 months, we can arrange to have the ovaries removed so she does not have to be coming here just for those shots.  Otherwise she will see [place] again in 3 months. She knows to call for any problems that may develop before her next visit here.  Chauncey Cruel, MD    04/07/2014

## 2014-04-07 NOTE — Telephone Encounter (Signed)
, °

## 2014-04-07 NOTE — Patient Instructions (Signed)
Goserelin injection What is this medicine? GOSERELIN (GOE se rel in) is similar to a hormone found in the body. It lowers the amount of sex hormones that the body makes. Men will have lower testosterone levels and women will have lower estrogen levels while taking this medicine. In men, this medicine is used to treat prostate cancer; the injection is either given once per month or once every 12 weeks. A once per month injection (only) is used to treat women with endometriosis, dysfunctional uterine bleeding, or advanced breast cancer. This medicine may be used for other purposes; ask your health care provider or pharmacist if you have questions. COMMON BRAND NAME(S): Zoladex What should I tell my health care provider before I take this medicine? They need to know if you have any of these conditions (some only apply to women): -diabetes -heart disease or previous heart attack -high blood pressure -high cholesterol -kidney disease -osteoporosis or low bone density -problems passing urine -spinal cord injury -stroke -tobacco smoker -an unusual or allergic reaction to goserelin, hormone therapy, other medicines, foods, dyes, or preservatives -pregnant or trying to get pregnant -breast-feeding How should I use this medicine? This medicine is for injection under the skin. It is given by a health care professional in a hospital or clinic setting. Men receive this injection once every 4 weeks or once every 12 weeks. Women will only receive the once every 4 weeks injection. Talk to your pediatrician regarding the use of this medicine in children. Special care may be needed. Overdosage: If you think you have taken too much of this medicine contact a poison control center or emergency room at once. NOTE: This medicine is only for you. Do not share this medicine with others. What if I miss a dose? It is important not to miss your dose. Call your doctor or health care professional if you are unable to  keep an appointment. What may interact with this medicine? -female hormones like estrogen -herbal or dietary supplements like black cohosh, chasteberry, or DHEA -female hormones like testosterone -prasterone This list may not describe all possible interactions. Give your health care provider a list of all the medicines, herbs, non-prescription drugs, or dietary supplements you use. Also tell them if you smoke, drink alcohol, or use illegal drugs. Some items may interact with your medicine. What should I watch for while using this medicine? Visit your doctor or health care professional for regular checks on your progress. Your symptoms may appear to get worse during the first weeks of this therapy. Tell your doctor or healthcare professional if your symptoms do not start to get better or if they get worse after this time. Your bones may get weaker if you take this medicine for a long time. If you smoke or frequently drink alcohol you may increase your risk of bone loss. A family history of osteoporosis, chronic use of drugs for seizures (convulsions), or corticosteroids can also increase your risk of bone loss. Talk to your doctor about how to keep your bones strong. This medicine should stop regular monthly menstration in women. Tell your doctor if you continue to menstrate. Women should not become pregnant while taking this medicine or for 12 weeks after stopping this medicine. Women should inform their doctor if they wish to become pregnant or think they might be pregnant. There is a potential for serious side effects to an unborn child. Talk to your health care professional or pharmacist for more information. Do not breast-feed an infant while taking   this medicine. Men should inform their doctors if they wish to father a child. This medicine may lower sperm counts. Talk to your health care professional or pharmacist for more information. What side effects may I notice from receiving this  medicine? Side effects that you should report to your doctor or health care professional as soon as possible: -allergic reactions like skin rash, itching or hives, swelling of the face, lips, or tongue -bone pain -breathing problems -changes in vision -chest pain -feeling faint or lightheaded, falls -fever, chills -pain, swelling, warmth in the leg -pain, tingling, numbness in the hands or feet -signs and symptoms of low blood pressure like dizziness; feeling faint or lightheaded, falls; unusually weak or tired -stomach pain -swelling of the ankles, feet, hands -trouble passing urine or change in the amount of urine -unusually high or low blood pressure -unusually weak or tired Side effects that usually do not require medical attention (report to your doctor or health care professional if they continue or are bothersome): -change in sex drive or performance -changes in breast size in both males and females -changes in emotions or moods -headache -hot flashes -irritation at site where injected -loss of appetite -skin problems like acne, dry skin -vaginal dryness This list may not describe all possible side effects. Call your doctor for medical advice about side effects. You may report side effects to FDA at 1-800-FDA-1088. Where should I keep my medicine? This drug is given in a hospital or clinic and will not be stored at home. NOTE: This sheet is a summary. It may not cover all possible information. If you have questions about this medicine, talk to your doctor, pharmacist, or health care provider.  2015, [employer]. (2013-05-25 11:10:35)  

## 2014-04-08 ENCOUNTER — Other Ambulatory Visit: Payer: Self-pay | Admitting: Internal Medicine

## 2014-04-08 ENCOUNTER — Ambulatory Visit (INDEPENDENT_AMBULATORY_CARE_PROVIDER_SITE_OTHER)
Admission: RE | Admit: 2014-04-08 | Discharge: 2014-04-08 | Disposition: A | Discharge status: Home / Self Care | Payer: Self-pay | Source: Ambulatory Visit | Attending: Internal Medicine | Admitting: Internal Medicine

## 2014-04-08 DIAGNOSIS — R059 Cough, unspecified: Secondary | ICD-10-CM

## 2014-04-08 DIAGNOSIS — R05 Cough: Secondary | ICD-10-CM | POA: Diagnosis not present

## 2014-04-08 DIAGNOSIS — J0130 Acute sphenoidal sinusitis, unspecified: Secondary | ICD-10-CM | POA: Diagnosis not present

## 2014-04-08 MED ORDER — MOXIFLOXACIN HCL 400 MG PO TABS: 400.0000 mg | ORAL_TABLET | Freq: Every day | ORAL | Status: DC

## 2014-04-08 NOTE — Progress Notes (Signed)
Quick Note:  Called cell number- someone answered the phone, but did not speak  Called home number and NA, unable to leave msg since mailbox was full, WCB ______

## 2014-04-08 NOTE — Progress Notes (Signed)
Quick Note:  Spoke with pt and notified of results per Dr. Wert. Pt verbalized understanding and denied any questions.  ______ 

## 2014-04-08 NOTE — Progress Notes (Signed)
Quick Note:  Called again, NA and no option to leave a msg, WCB ______

## 2014-04-08 NOTE — Progress Notes (Signed)
Quick Note:  ATC, NA and still no option to leave msg, WCB ______

## 2014-04-08 NOTE — Progress Notes (Signed)
Quick Note:  LM with pt's significant other's brother to have her call ______

## 2014-04-08 NOTE — Addendum Note (Signed)
Addended by: Laureen Abrahams on: 04/08/2014 06:26 PM   Modules accepted: Medications

## 2014-04-08 NOTE — Progress Notes (Signed)
Quick Note:  Spoke with pt and notified of results per Dr. Melvyn Novas. Pt verbalized understanding and denied any questions. Rx was sent to pharm ______

## 2014-04-18 ENCOUNTER — Ambulatory Visit (INDEPENDENT_AMBULATORY_CARE_PROVIDER_SITE_OTHER): Payer: Self-pay | Admitting: Adult Health

## 2014-04-18 VITALS — BP 130/70 | HR 91 | Temp 98.5°F

## 2014-04-18 DIAGNOSIS — R059 Cough, unspecified: Secondary | ICD-10-CM

## 2014-04-18 DIAGNOSIS — R05 Cough: Secondary | ICD-10-CM

## 2014-04-18 DIAGNOSIS — G4710 Hypersomnia, unspecified: Secondary | ICD-10-CM

## 2014-04-18 DIAGNOSIS — J4540 Moderate persistent asthma, uncomplicated: Secondary | ICD-10-CM

## 2014-04-18 DIAGNOSIS — R400 Somnolence: Secondary | ICD-10-CM

## 2014-04-18 NOTE — Assessment & Plan Note (Signed)
Persistent cough with GERD/AR and +sinusitis  Refer to ENT for evaluation  Finish abx for sinusuitis  Cont to control for cough .

## 2014-04-18 NOTE — Assessment & Plan Note (Signed)
Follow up for sleep study next week as planned

## 2014-04-18 NOTE — Progress Notes (Signed)
Subjective:    Patient ID: Erica [name], female    DOB: [birth year]   MRN: [id number]    Brief patient profile:  69 yobf with asthma all her life much worse since around 07/2010 referred by Dr [name] to pulmonary clinic in Sept 2012 Hx of Breast Cancer 05/2008    History of Present Illness  12/18/2010 Initial pulmonary office eval on acei and  advair cc dtc asthma x 4 months but baseline = excess saba use "even when better" at least twice daily despite daily advair, worse at hs in fact  used saba 3 x last night.  On chemo /RT for breast ca but last rx was Jan 2012 well before asthma worse.  To er multiple times >  no better with singular added x 3 months. rec Stop Lisinopril Dulera 200 Take 2 puffs first thing in am and then another 2 puffs about 12 hours later.  Work on inhaler technique  Nexium 40 mg   Take 30-60 min before first meal of the day and Pepcid 20 mg one bedtime until return Only use your albuterol as a rescue medication to be used if you can't catch your breath by resting or doing a relaxed purse lip breathing pattern. The less you use it, the better it will work when you need it.  Prednisone 10 mg take  4 each am x 2 days,   2 each am x 2 days,  1 each am x2days and stop    01/03/2011 f/u ov/Wert cc much better off ace and advair but still needing saba twice daily on avg. No  Purulent sputum. rec no change in rx/ rule of 2's reviewed       09/11/2012 f/u ov/Wert "did fine" p prev ov then resumed advair 250  And now on normodyne Chief Complaint  Patient presents with  . Acute Visit    Pt c/o increased cough and SOBfor the past 2 wks. Cough is occ prod with minimal clear sputum. Breathing is esp worse at night, waking up 2-3 times at night to use neb.   >>Prednisone 10 mg take  4 each am x 2 days,   2 each am x 2 days,  1 each am x 2 days and stop  Stop advair and normodyne bystolic 20 mg every 12 hours (2 x 10 every 12 hours)  dulera 100 Take 2 puffs first thing in am  and then another 2 puffs about 12 hours later   09/25/2012 Med calendar  Returns for 1 week follow up for med review  We reviewed all her meds .Data base down for med calendars.  She depends on samples , rx to [place] assistance program and generics -as she does not have insurance.  We discussed drug assistance program for [facility].  Will need to change bystolic to bisoprolol for cost issues.  Last visit labs revealed neg d dimer and bnp. CXR w/out acute process.  She feels about the same still gets DOE . Occasional cough and wheezing on/off.  No fever or discolored mucus.  Lives part-time in [facility].  rec Stop Ipratropium Neb.  Continue on current regimen .  We will change Bystolic to Bisoprolol 10mg  Twice daily   We will get paperwork for Drug assistance program for [facility]    01/12/2013 f/u ov/Wert re: dtc asthma Chief Complaint  Patient presents with  . Follow-up    PFT done today.   Even on prednisone still need albuterol hfa avg once at night and  neb saba also w/in 2 h of saba hfa for noct cough/ wheeze but much less sob/wheeze during the day eg with walk then worse in 2 weeks despite dulera 100 2bid.  rec Increase dulera to 200 Take 2 puffs first thing in am and then another 2 puffs about 12 hours later.  I will contact Dr Hilarie Fredrickson regarding your night time spells that don't resolve  even on the prednisone Prednisone 10 mg take  4 each am x 2 days,   2 each am x 2 days,  1 each am x 2 days and stop Please schedule a follow up office visit in 4 weeks, sooner if needed with all meds and inhalers in hand  Be sure using mucinex dm and tramadol correctly      04/06/2013 Follow up and Med Review   Returns for follow up and med review  We reviewed all her meds and organized them into a med calendar. She does have Medicaid but depends on samples. Did not bring all her meds with her today , says they are at home.  Says her breathing is the same since last ov w/ SOB, Has on /off wheezing, some  chest heaviness, prod cough with yellow mucus. Good and bad days. Wears out easily .  Denies f/c/s, nausea, vomiting.  Did not stop singulair as recommended.  rec  Follow med calendar closely and bring to each visit > did not do  We are referring you for a CPSTwith spirometry before and after> never done    07/28/2013 Follow up and Med Review  Patient returns for a followup and medication review. We reviewed all her medications organized them into a medication calendar with patient education. She has many med changes and is confused with several meds. We reviewed all the recent changes and updated her list w/ education.Called her pharmacy to verify/update.  Patient reports that she was recently seen in emergency room on April 27 due to chest pain, dizziness and asthma flare. She was given a Depo-Medrol shot, meclizine. And prednisone taper.   She says that she is feeling better. CP and dizziness resolved.  CXR w/ nad , CT head was neg . She denies any hemoptysis, chest pain, orthopnea, PND, or leg swelling. >>taper off pred.   01/26/2014 Acute OV (Asthma and Cough)  Complains of prod cough with yellow mucus (worse at night), wheezing, increased SOB, some tightness worse for last couple of weeks.  Cough has never went away.  Reports symptoms have not improved since last ov.   Denies f/c/s, n/v/d, hemoptysis, chest pain, orthopnea or edema.  Remains on Dulera Twice daily  .  Using tramadol and mucinex dm for cough with some help.  She is followed by Neurology for neuropathy d/t chemo, peeudotumor cerbri,  Hx of Breast cancer s/p lumpectomy/radiation/chemo.  rec Doxycyline 100mg  Twice daily  For 7 days  Mucinex DM Twice daily  As needed  Cough Prednisone taper over next week    04/04/2014 f/u ov/Wert re: chronic asthma/ cough  Chief Complaint  Patient presents with  . Follow-up    Pt states that her cough is unchanged. She denies any new co's today.   75% better typically after  prednisone and then worse off same pattern x one year Cough worse when lie down > yellowish thick most productive in am  Worse chest  burning off ppi but no change on breathing or coughing with overt HB Not needing saba for breathing, uses neb "helps cough" once  a day avg  >>change to [facility] 100, CXR   04/18/2014 Follow up and Med Review  Patient returns for a two-week follow-up and medication review. We reviewed all her medications organize them into a medication count with patient education It appears that she is taking her medications correctly, however, she has over 30 medications and has had several changes recently. Last visit, her [name] was decreased to the 162mcg says that her breathing was better on the 258mcg Patient had a chest x-ray last visit that showed no acute changes She underwent a CT sinus due to persistent cough , which was positive for sinusitis on the right She was treated with a ten-day course of Avelox, she has one dose left Does feel some better but still has cough . She denies any hemoptysis, orthopnea, PND or leg swelling  Patient has upcoming sleep study. Next week for suspected underlying sleep apnea with snoring and daytime sleepiness  Current Medications, Allergies, Complete Past Medical History, Past Surgical History, Family History, and Social History were reviewed in [employer] record.  ROS  The following are not active complaints unless bolded sore throat, dysphagia, dental problems, itching, sneezing,  nasal congestion or excess/ purulent secretions, ear ache,   fever, chills, sweats, unintended wt loss, pleuritic or exertional cp, hemoptysis,  orthopnea pnd or leg swelling, presyncope, palpitations, heartburn, abdominal pain, anorexia, nausea, vomiting, diarrhea  or change in bowel or urinary habits, change in stools or urine, dysuria,hematuria,  rash, arthralgias, visual complaints, headache, numbness weakness or ataxia or problems  with walking or coordination,  change in mood/affect or memory.                Objective:   Physical Exam  Obese bf nad    Wt 213  12/18/10 > 01/03/2011  214 > 01/31/2011  206 >  09/11/2012 209 >208 6/19 > 210 09/25/2012 > 11/20/2012 211 >208 12/07/12> 01/12/2013 209 > 02/09/2013 210 >207 04/06/2013 > 07/14/2013  213>212 07/28/2013 >204 01/26/2014> 04/04/2014 206  >205 04/18/2014   HEENT: nl dentition, turbinates, and orophanx. Nl external ear canals without cough reflex, NM swollen    NECK :  without JVD/Nodes/TM/ nl carotid upstrokes bilaterally   LUNGS: lungs clear bilaterally with good air movement/ mild pseudowheeze   CV:  RRR  no s3 or murmur or increase in P2, no edema   ABD:  soft and nontender with nl excursion in the supine position. No bruits or organomegaly, bowel sounds nl  MS:  warm without deformities, calf tenderness, cyanosis or clubbing   cxr 04/04/14 Enlargement of cardiac silhouette and minimal chronic bronchitic changes. No acute abnormalities.  04/08/14 CT sinus >Air-fluid level in the right sphenoid sinusAir-fluid level in the right sphenoid sinus

## 2014-04-18 NOTE — Progress Notes (Signed)
Notes reviewed/ present ov reviewed and fully agree with a/p

## 2014-04-18 NOTE — Patient Instructions (Signed)
Refer to ENT for sinusitis and chronic cough  Increase Dulera 200 2 puffs Twice daily   Follow med calendar closely and bring to each visit.  Follow up Dr. Melvyn Novas  In 3 months and As needed   Please contact office for sooner follow up if symptoms do not improve or worsen or seek emergency care

## 2014-04-18 NOTE — Assessment & Plan Note (Signed)
Difficult to control with cyclical cough and sinusitis We'll increase Dulera to 200 Refer to ENT for evaluation of sinusitis Patient is to finish her current antibiotics with Avelox Will add flutter valve to her Mucinex for cough control Continue to control for triggers Patient's medications were reviewed today and patient education was given. Computerized medication calendar was adjusted/completed

## 2014-04-19 MED ORDER — MOMETASONE FURO-FORMOTEROL FUM 200-5 MCG/ACT IN AERO: 2.0000 | INHALATION_SPRAY | Freq: Two times a day (BID) | RESPIRATORY_TRACT | Status: AC

## 2014-04-19 MED ORDER — FLUTTER DEVI: Status: DC

## 2014-04-19 MED ORDER — MOMETASONE FURO-FORMOTEROL FUM 200-5 MCG/ACT IN AERO: 2.0000 | INHALATION_SPRAY | Freq: Two times a day (BID) | RESPIRATORY_TRACT | Status: DC

## 2014-04-19 NOTE — Addendum Note (Signed)
Addended by: Parke Poisson E on: 04/19/2014 11:37 AM   Modules accepted: Orders

## 2014-04-19 NOTE — Addendum Note (Signed)
Addended by: Parke Poisson E on: 04/19/2014 03:55 PM   Modules accepted: Orders

## 2014-04-21 ENCOUNTER — Ambulatory Visit: Payer: Self-pay | Admitting: Internal Medicine

## 2014-04-25 ENCOUNTER — Inpatient Hospital Stay: Admission: RE | Admit: 2014-04-25 | Payer: Self-pay | Source: Ambulatory Visit

## 2014-04-25 ENCOUNTER — Ambulatory Visit (HOSPITAL_BASED_OUTPATIENT_CLINIC_OR_DEPARTMENT_OTHER): Payer: Medicaid - Out of State | Attending: Nurse Practitioner | Admitting: Radiology

## 2014-04-25 VITALS — Ht 67.0 in | Wt 205.0 lb

## 2014-04-25 DIAGNOSIS — G4733 Obstructive sleep apnea (adult) (pediatric): Secondary | ICD-10-CM

## 2014-04-25 DIAGNOSIS — R55 Syncope and collapse: Secondary | ICD-10-CM

## 2014-05-02 ENCOUNTER — Other Ambulatory Visit (HOSPITAL_COMMUNITY): Payer: Self-pay | Admitting: *Deleted

## 2014-05-02 DIAGNOSIS — Z853 Personal history of malignant neoplasm of breast: Secondary | ICD-10-CM

## 2014-05-03 ENCOUNTER — Encounter: Payer: Self-pay | Admitting: Cardiology

## 2014-05-03 ENCOUNTER — Ambulatory Visit (INDEPENDENT_AMBULATORY_CARE_PROVIDER_SITE_OTHER): Payer: Medicaid - Out of State | Admitting: Cardiology

## 2014-05-03 VITALS — BP 156/88 | HR 92 | Ht 67.0 in | Wt 199.0 lb

## 2014-05-03 DIAGNOSIS — I10 Essential (primary) hypertension: Secondary | ICD-10-CM

## 2014-05-03 DIAGNOSIS — I5033 Acute on chronic diastolic (congestive) heart failure: Secondary | ICD-10-CM | POA: Insufficient documentation

## 2014-05-03 DIAGNOSIS — E876 Hypokalemia: Secondary | ICD-10-CM

## 2014-05-03 MED ORDER — NEBIVOLOL HCL 10 MG PO TABS: 10.0000 mg | ORAL_TABLET | Freq: Every day | ORAL | Status: DC

## 2014-05-03 NOTE — Addendum Note (Signed)
Addended by: Nuala Alpha on: 05/03/2014 05:41 PM   Modules accepted: Orders

## 2014-05-03 NOTE — Patient Instructions (Addendum)
Your physician has recommended you make the following change in your medication:   INCREASE YOUR BYSTOLIC TO [place]   Your physician recommends that you return for lab work in: Thursday 05/05/14 at our office to check a BMET     Your physician recommends that you schedule a follow-up appointment in: [place]

## 2014-05-03 NOTE — Progress Notes (Signed)
Patient ID: LEONORE FRANKSON, female   DOB: 07/06/1975, 39 y.o.   MRN: [id number]    Patient Name: Erica [name] Date of Encounter: 05/03/2014  Primary Care Provider:  Scarlette Calico, MD Primary Cardiologist:  Ena Dawley H  Patient Profile  Chest pain  Problem List   Past Medical History  Diagnosis Date  . Breast cancer 2010    a. locally advanced left breast carcinoma diagnosed in 2010 and treated with neoadjuvant chemotherapy with docetaxel and cyclophosphamide as well as paclitaxel. This was followed by radiation therapy which was completed in 2011.  . Asthma   . Hypertension   . GERD (gastroesophageal reflux disease)   . Impaired glucose tolerance 04/13/2011  . Anemia, unspecified 04/13/2011  . Asthma 09/27/2010  . Bloating 03/18/2011  . 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 Lymphadenitis, chronic     restricted LEFT extremity  . Dyspnea on exertion     a. 01/2013 Lexi MV: EF 54%, no ischemia/infarct;  b. 05/2013 Echo: EF 60-65%, no rwma, Gr 2 DD;  b.    Past Surgical History  Procedure Laterality Date  . Hand surgery  2012    right; tendon release  . Breast lumpectomy  09/2008    left  . Carpal tunnel release Left 2011  . Tubal ligation  05/11/2011    Procedure: POST PARTUM TUBAL LIGATION;  Surgeon: Emeterio Reeve, MD;  Location: [facility];  Service: Gynecology;  Laterality: Bilateral;  Induced for HTN  . Lymph node dissection  2010    left breast; 2 wks after breast lumpectomy    Allergies  Allergies  Allergen Reactions  . Aspirin Shortness Of Breath and Palpitations    Pt can take ibuprofen without reaction  . Doxycycline Anaphylaxis  . Penicillins Shortness Of Breath and Palpitations  . Kiwi Extract Itching and Swelling    Lip swelling  . Strawberry Hives    HPI  39 year old female with h/o asthma, obesity, hypertension, who is coming with complaints of chest pain. This pleasant young female has a history of locally advanced left breast carcinoma diagnosed in  2010 and treated with neoadjuvant chemotherapy with docetaxel and cyclophosphamide as well as paclitaxel. This was followed by radiation therapy which was completed in 2011. The patient complains of chest pain it is not exertional, it is locally located in the retrosternal and left epigastric region. It has been happening anytime of the day, and can last for minutes. There is no clear aggravating or alleviating factor. The patient states that ever since her chemotherapy she gets short of breath with minimal activity and states that she will be able to walk a block.  Follow up after 4 month, no change in her symptoms, she still has exertional shortness of breath and fatigue. She also has a paroxysmal chest pain "heart squeezing" associated with SOB, alleviated by lorazepam and inhalors.   09/27/2013 - the patient is coming complaining of continuous daily wheezing, and nonexertional right upper quadrant pain that force her to go to the ER. A complete workup including abdominal ultrasound and CAT scan was performed. The only finding was fatty liver no acute pathology in the gallbladder no gallstones. The patient had an upper EGD last year where her gastroparesis was diagnosed and she was started on metoclopramide and proton next. Her symptoms improved however she continues having episodes of this pain. She feels overall very tired and short of breath and complaints of orthopnea.  11/30/2013 - we stopped Bystolic at the  last visit as it caused wheezing, her wheezing has improved but she feels more SOB. She is trying to exercise but gets SOB easily.   03/14/2014 -  The patient is undergoing cardiac rehabilitation and has experienced at least 2 episodes of syncope that are not related to exertion.  One of her episodes was while she was wearing Holter monitor and strips only showed sinus rhythm. The patient is being evaluated for sleep apnea in January.She continues going to cardiac rehabilitation and enjoys it  very well she overall is feels better.She brings her blood pressure log and her blood pressure  Are in the range 40-981 for systolic blood pressure. She also complains of lower extremity edema that happens on certain days.  05/03/2014 - the patient is coming with complaint of headache with Imdur. She still taking it. She wakes up every night with shortness of breath that resolves after she uses albuterol inhaler. She tries to exercise but bending makes her feel more short of breath. She underwent sleep study and is awaiting results.  Home Medications  Prior to Admission medications   Medication Sig Start Date End Date Taking? Authorizing Provider  albuterol (PROVENTIL HFA;VENTOLIN HFA) 108 (90 BASE) MCG/ACT inhaler Inhale 2 puffs into the lungs every 4 (four) hours as needed for wheezing or shortness of breath (((PLAN A))).     Historical Provider, MD  albuterol (PROVENTIL) (2.5 MG/3ML) 0.083% nebulizer solution Take 2.5 mg by nebulization every 4 (four) hours as needed for wheezing or shortness of breath (((PLAN B))). For shortness of breath. 05/07/12   Amy Milda Smart, PA-C  amLODipine-olmesartan (AZOR) 5-40 MG per tablet Take 1 tablet by mouth daily. 08/05/12   [name], MD  cloNIDine (CATAPRES) 0.2 MG tablet Take 1 tablet (0.2 mg total) by mouth 2 (two) times daily. 08/05/12   [name], MD  hyoscyamine (LEVSIN SL) 0.125 MG SL tablet Place 0.125 mg under the tongue every 8 (eight) hours as needed for cramping. 05/19/12   Jerene Bears, MD  LORazepam (ATIVAN) 0.5 MG tablet Take 1 tablet (0.5 mg total) by mouth at bedtime as needed for anxiety. 01/07/13   Chauncey Cruel, MD  metoCLOPramide (REGLAN) 10 MG tablet Take 1 tablet (10 mg total) by mouth 4 (four) times daily -  with meals and at bedtime. 01/14/13   Jerene Bears, MD  mometasone-formoterol (DULERA) 100-5 MCG/ACT AERO Inhale 2 puffs into the lungs 2 (two) times daily. 05/07/12   Amy Milda Smart, PA-C  montelukast (SINGULAIR) 10 MG tablet Take 1  tablet (10 mg total) by mouth at bedtime. 12/31/11   Chauncey Cruel, MD  nebivolol (BYSTOLIC) 10 MG tablet Take 10 mg by mouth at bedtime.    Historical Provider, MD  pantoprazole (PROTONIX) 40 MG tablet Take 1 tablet (40 mg total) by mouth daily. 01/07/13   Chauncey Cruel, MD  potassium chloride SA (K-DUR,KLOR-CON) 20 MEQ tablet Take 20 mEq by mouth 2 (two) times daily. 06/04/12   [name], MD  sucralfate (CARAFATE) 1 G tablet Take 1 tablet (1 g total) by mouth 4 (four) times daily. 03/07/12   [name], MD  traMADol Veatrice Bourbon) 50 MG tablet 1-2 tabs by mouth every 4 hours as needed for cough or pain 09/10/12   Amy Milda Smart, PA-C  triamterene-hydrochlorothiazide (MAXZIDE-25) 37.5-25 MG per tablet Take 1 tablet by mouth every morning.    Historical Provider, MD    Family History  Family History  Problem Relation  Age of Onset  . Diabetes Maternal Grandmother   . Heart disease Maternal Grandmother   . Hypertension Maternal Grandmother   . Cancer Neg Hx   . Alcohol abuse Neg Hx   . Early death Neg Hx   . Hyperlipidemia Neg Hx   . Kidney disease Neg Hx   . Stroke Neg Hx   . Colon cancer Neg Hx     Social History  History   Social History  . Marital Status: Single    Spouse Name: N/A    Number of Children: 3  . Years of Education: N/A   Occupational History  .     Social History Main Topics  . Smoking status: Never Smoker   . Smokeless tobacco: Never Used  . Alcohol Use: 0.0 oz/week     Comment: occasionally  . Drug Use: No  . Sexual Activity: Yes    Birth Control/ Protection: Surgical   Other Topics Concern  . Not on file   Social History Narrative   She lives with four children.   She is currently not working (disbaility pending).           Review of Systems General:  No chills, fever, night sweats or weight changes.  Cardiovascular:  No chest pain, dyspnea on exertion, edema, orthopnea, palpitations, paroxysmal nocturnal dyspnea. Dermatological: No  rash, lesions/masses Respiratory: No cough, dyspnea Urologic: No hematuria, dysuria Abdominal:   No nausea, vomiting, diarrhea, bright red blood per rectum, melena, or hematemesis Neurologic:  No visual changes, wkns, changes in mental status. All other systems reviewed and are otherwise negative except as noted above.  Physical Exam Blood pressure 156/88, pulse 92, height 5\' 7"  (1.702 m), weight 199 lb (90.266 kg), last menstrual period 04/01/2014.  General: Pleasant, NAD Psych: Normal affect. Neuro: Alert and oriented X 3. Moves all extremities spontaneously. HEENT: Normal  Neck: Supple without bruits or JVD. Lungs:  Resp regular and unlabored, CTA. Heart: RRR no s3, s4, or murmurs. Abdomen: Soft, non-tender, non-distended, BS + x 4.  Extremities: No clubbing, cyanosis or edema. DP/PT/Radials 2+ and equal bilaterally.  Lipid Panel     Component Value Date/Time   CHOL 172 02/03/2014 1144   TRIG 151.0* 02/03/2014 1144   HDL 32.00* 02/03/2014 1144   CHOLHDL 5 02/03/2014 1144   VLDL 30.2 02/03/2014 1144   LDLCALC 110* 02/03/2014 1144   Nuclear stress test: 02/15/2013 Impression  Exercise Capacity: Lexiscan with no exercise.  BP Response: Hypertensive blood pressure response.  Clinical Symptoms: Chest pain, headache.  ECG Impression: No significant ST segment change suggestive of ischemia.  Comparison with Prior Nuclear Study: No images to compare  Overall Impression: Normal stress nuclear study.  LV Ejection Fraction: 54%. LV Wall Motion: NL LV Function; NL Wall Motion  Loralie Champagne  02/12/2013   TTE 06/17/2013  Left ventricle: The cavity size was normal. There was mild concentric hypertrophy. Systolic function was normal. The estimated ejection fraction was in the range of 60% to 65%. Wall motion was normal; there were no regional wall motion abnormalities. Features are consistent with a pseudonormal left ventricular filling pattern, with concomitant  abnormal relaxation and increased filling pressure (grade 2 diastolic dysfunction). Doppler parameters are consistent with high ventricular filling pressure. Impressions: - When compared to 2010, EF has improved.  Accessory Clinical Findings  ECG - normal sinus rhythm, 100 beats per minute, normal EKG.    Assessment & Plan  39 year old female with prior medical history of hyperlipidemia, hypertension and breast carcinoma  with chemotherapy that can potentially cause heart block, heart failure and angina.  1.  Recurrent syncope -  E-cardio monitor showed only sinus rhythm. This is possibly due to narcolepsy, she is awaiting a sleep study. Results are pending.  2. Chronic diastolic CHF, associated with dyspnea on exertion - A lexiscan nuclear stress test showed poor exercise capacity, LV EF 54% and no perfusion defect.  Echocardiogram showed preserved LV function it is improved compared to prior echo however she has pseudo-normal pattern of diastolic dysfunction with elevated filling pressures.  Her blood pressure management has been number of challenging as her blood pressure is labile and can be very low as well. She will also be given a prescription for Lasix 40 mg daily when necessary for the days when her lower extremity edema occurs. We'll check BMP today as she has been taking Lasix lately and her potassium has been 3.3 on lab on 04/07/2014.  3. HTN - labile,  normal renal arterial hypertension, will increase Bystolic to 10 mg daily. And discontinue interest that gives her headache.  4. Lipid profile - LDL 129 just recommend exercise and diet.  5. 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  [name], MD 05/03/2014, 4:01 PM

## 2014-05-04 ENCOUNTER — Telehealth: Payer: Self-pay | Admitting: Cardiology

## 2014-05-04 ENCOUNTER — Encounter: Payer: Self-pay | Admitting: Cardiology

## 2014-05-04 DIAGNOSIS — G4730 Sleep apnea, unspecified: Secondary | ICD-10-CM | POA: Insufficient documentation

## 2014-05-04 DIAGNOSIS — G4733 Obstructive sleep apnea (adult) (pediatric): Secondary | ICD-10-CM

## 2014-05-04 HISTORY — DX: Sleep apnea, unspecified: G47.30

## 2014-05-04 HISTORY — DX: Obstructive sleep apnea (adult) (pediatric): G47.33

## 2014-05-04 NOTE — Telephone Encounter (Signed)
Please let patient know that patient has severe OSA with AHI of 31/hr and had successful CPAP titration to 12cm H2O.  Please let [facility] know that order for CPAP is in [facility] and set up appt with me in 10 weeks

## 2014-05-04 NOTE — Telephone Encounter (Signed)
Both home and mobile mailboxes are full.  Will try again later.

## 2014-05-04 NOTE — Addendum Note (Signed)
Addended by: [name] on: 05/04/2014 12:37 PM   Modules accepted: Orders

## 2014-05-04 NOTE — Sleep Study (Addendum)
   NAME: Erica [name] DATE OF BIRTH:  March 24, 1976 MEDICAL RECORD NUMBER [id number]  LOCATION: [facility]  PHYSICIAN: TURNER,[name] R  DATE OF STUDY: 04/25/2014  SLEEP STUDY TYPE: Nocturnal Polysomnogram with CPAP titration               REFERRING PHYSICIAN: Rogelia Mire, NP  INDICATION FOR STUDY: witnessed apnea, snoring, excessive daytime fatigue and morning headaches  EPWORTH SLEEPINESS SCORE: 4 HEIGHT: 5\' 7"  (170.2 cm)  WEIGHT: 205 lb (92.987 kg)    Body mass index is 32.1 kg/(m^2).  NECK SIZE: 15.5 in.  MEDICATIONS: Reviewed in the chart  SLEEP ARCHITECTURE:  During the diagnostic portion of the study the patient slept for a total of 162 minutes with 9 minutes of slow wave sleep and 15 minutes of REM sleep.  The onset to REM sleep latency was 50 minutes and sleep efficiency was normal at 97%.  During the CPAP titration the total sleep time was 196 minutes with 3 minutes of slow wave sleep and 59 minutes of REM sleep.  The onset to sleep latency was 10 minutes and onset to REM sleep latency was 64 minutes.  The sleep efficiency was normal at 86%.    RESPIRATORY DATA: During the diagnostic portion of the study the patient had a total of 29 apneas all of which were obstructive and there were 57 hypopneas.  Most events occurred during NREM sleep in the supine position.  The overall AHI was elevated at 13 events per hour consistent with severe obstructive sleep apnea/hypopnea syndrome. There was mild to moderate snoring.   The patient was started on CPAP at 5cm H2O and pressure was titrated for snoring and respiratory events to 12cm H2O.  There was no audible snoring at this pressure.  The AHI was 0 at 12cm H2O and the patient was able to sleep for a prolonged period of time at this pressure in REM supine sleep without any further respiratory events.  Sleep efficiency was 95%.    OXYGEN DATA: The lowest O2 sat during diagnostic portion of study was 81% during REM  sleep and 85% in REM sleep during the titration. The time spent with O2 sats <88% during the titration was 0.3 minutes.    CARDIAC DATA: That patient maintained NSR with occasional PVC  MOVEMENT/PARASOMNIA: There were no REM behavior disorders or periodic limb movements noted.   IMPRESSION/ RECOMMENDATION:   1.  Severe obstructive sleep apnea/hypopnea syndrome with AHI of 31 events per hour.  Most events occurred in NREM supine sleep.   2.  Successful CPAP titration to 12cm H2O using a small [employer] full face mask 3.  Obesity - The patient should be counsled in good sleep hygiene and weight loss. 4.  The patient should be counseled to avoid sleeping in the supine position. 5.  Recommend ResMed CPap with heated humidity, C flex setting of 3 and small [employer]kel full face mask at 12cm H2O.   Signed: [name] Diplomate, American Board of Sleep Medicine  ELECTRONICALLY SIGNED ON:  05/04/2014, 12:20 PM [place] PH: [phone]   FX: [phone] [place]

## 2014-05-05 ENCOUNTER — Other Ambulatory Visit (INDEPENDENT_AMBULATORY_CARE_PROVIDER_SITE_OTHER): Payer: Medicaid - Out of State | Admitting: *Deleted

## 2014-05-05 ENCOUNTER — Ambulatory Visit (HOSPITAL_BASED_OUTPATIENT_CLINIC_OR_DEPARTMENT_OTHER): Payer: Medicaid - Out of State

## 2014-05-05 ENCOUNTER — Encounter: Payer: Self-pay | Admitting: Cardiology

## 2014-05-05 DIAGNOSIS — I10 Essential (primary) hypertension: Secondary | ICD-10-CM

## 2014-05-05 DIAGNOSIS — E876 Hypokalemia: Secondary | ICD-10-CM

## 2014-05-05 DIAGNOSIS — I5033 Acute on chronic diastolic (congestive) heart failure: Secondary | ICD-10-CM

## 2014-05-05 DIAGNOSIS — Z5111 Encounter for antineoplastic chemotherapy: Secondary | ICD-10-CM

## 2014-05-05 DIAGNOSIS — C50412 Malignant neoplasm of upper-outer quadrant of left female breast: Secondary | ICD-10-CM

## 2014-05-05 MED ORDER — GOSERELIN ACETATE 3.6 MG ~~LOC~~ IMPL
3.6000 mg | DRUG_IMPLANT | Freq: Once | SUBCUTANEOUS | Status: AC
  Administered 2014-05-05: 3.6 mg via SUBCUTANEOUS
  Filled 2014-05-05: qty 3.6

## 2014-05-05 NOTE — Telephone Encounter (Signed)
This encounter was created in error - please disregard.

## 2014-05-05 NOTE — Addendum Note (Signed)
Addended by: Parke Poisson E on: 05/05/2014 01:55 PM   Modules accepted: Orders, Medications

## 2014-05-05 NOTE — Telephone Encounter (Signed)
New problem   Pt returning call from nurse from yesterday. Please call pt.

## 2014-05-05 NOTE — Telephone Encounter (Signed)
Patient informed of results and verbal understanding expressed.  OV scheduled in 10 weeks.  [place] notified.  Patient agrees with treatment plan.

## 2014-05-06 LAB — BASIC METABOLIC PANEL
BUN: 9 mg/dL (ref 6–23)
CO2: 24 mEq/L (ref 19–32)
Calcium: 9.2 mg/dL (ref 8.4–10.5)
Chloride: 111 mEq/L (ref 96–112)
Creatinine, Ser: 1.09 mg/dL (ref 0.40–1.20)
GFR: 71.27 mL/min (ref >60.00)
Glucose, Bld: 100 mg/dL — ABNORMAL HIGH (ref 70–99)
Potassium: 3.2 mEq/L — ABNORMAL LOW (ref 3.5–5.1)
Sodium: 141 mEq/L (ref 135–145)

## 2014-05-09 ENCOUNTER — Telehealth: Payer: Self-pay | Admitting: Cardiology

## 2014-05-09 DIAGNOSIS — E876 Hypokalemia: Secondary | ICD-10-CM

## 2014-05-09 NOTE — Telephone Encounter (Signed)
Informed the pt that we received her lab results and per Dr Meda Coffee, she needs to increase KCL from 20 mEq BID to 20 mEq TID, repeat BMP in 3 weeks. Updated pts med list.  Pt states she has enough K on hand, being she is staying on same dose, just changing the frequency of how she takes it.  Made pt a lab appt for 05/30/14 as requested to check a bmet.  Pt verbalized understanding and agrees with this plan.

## 2014-05-09 NOTE — Telephone Encounter (Signed)
New message  ° ° °Returning call back to nurse.  °

## 2014-05-19 ENCOUNTER — Ambulatory Visit
Admission: RE | Admit: 2014-05-19 | Discharge: 2014-05-19 | Disposition: A | Discharge status: Home / Self Care | Payer: Medicaid - Out of State | Source: Ambulatory Visit | Attending: Obstetrics and Gynecology | Admitting: Obstetrics and Gynecology

## 2014-05-19 ENCOUNTER — Encounter (HOSPITAL_COMMUNITY): Payer: Self-pay

## 2014-05-19 ENCOUNTER — Ambulatory Visit (HOSPITAL_COMMUNITY)
Admission: RE | Admit: 2014-05-19 | Discharge: 2014-05-19 | Disposition: A | Discharge status: Home / Self Care | Payer: Medicaid - Out of State | Source: Ambulatory Visit | Attending: Obstetrics and Gynecology | Admitting: Obstetrics and Gynecology

## 2014-05-19 VITALS — BP 186/108 | Temp 98.5°F | Ht 67.0 in | Wt 197.6 lb

## 2014-05-19 DIAGNOSIS — Z01419 Encounter for gynecological examination (general) (routine) without abnormal findings: Secondary | ICD-10-CM

## 2014-05-19 DIAGNOSIS — Z853 Personal history of malignant neoplasm of breast: Secondary | ICD-10-CM

## 2014-05-19 NOTE — Patient Instructions (Signed)
Explained to Erica [name] that [facility] will cover Pap smears and co-testing every 5 years unless has a history of abnormal Pap smears. Referred patient to the [facility] for diagnostic mammogram per recommendation. Appointment scheduled for Thursday, May 19, 2014 at 1550. Patient aware of appointment and will be there. Let patient know will follow up with her within the next couple weeks with results of Pap smear by phone. Erica [name] verbalized understanding.

## 2014-05-19 NOTE — Progress Notes (Signed)
No complaints today.  Pap Smear: Pap smear completed today. Patients last Pap smear was 04/10/2011 at the [facility] and normal. Per patient has no history of an abnormal Pap smear. Last Pap smear result is in EPIC.  Physical exam: Breasts Right breast larger than left breast due to history of lumpectomy in left breast in 2010. No skin abnormalities right breast. Radiation changes to skin present left breast from history of radiation. No nipple retraction bilateral breasts. No nipple discharge bilateral breasts. No lymphadenopathy. No lumps palpated bilateral breasts. No complaints of pain or tenderness on exam. Referred patient to the [facility] for diagnostic mammogram per recommendation. Appointment scheduled for Thursday, May 19, 2014 at 1550.          Pelvic/Bimanual   Ext Genitalia No lesions, no swelling and no discharge observed on external genitalia.         Vagina Vagina pink and normal texture. No lesions or discharge observed in vagina.          Cervix Cervix is present. Cervix pink and of normal texture. Cervix friable. No discharge observed.     Uterus Uterus is present and palpable. Uterus in normal position and normal size.        Adnexae Bilateral ovaries present and palpable. No tenderness on palpation.          Rectovaginal No rectal exam completed today since patient had no rectal complaints. No skin abnormalities observed on exam.

## 2014-05-20 ENCOUNTER — Encounter: Payer: Self-pay | Admitting: Neurology

## 2014-05-20 ENCOUNTER — Ambulatory Visit (INDEPENDENT_AMBULATORY_CARE_PROVIDER_SITE_OTHER): Payer: Medicaid - Out of State | Admitting: Neurology

## 2014-05-20 VITALS — BP 148/80 | HR 68 | Ht 67.0 in | Wt 200.4 lb

## 2014-05-20 DIAGNOSIS — T451X5A Adverse effect of antineoplastic and immunosuppressive drugs, initial encounter: Secondary | ICD-10-CM

## 2014-05-20 DIAGNOSIS — H00013 Hordeolum externum right eye, unspecified eyelid: Secondary | ICD-10-CM

## 2014-05-20 DIAGNOSIS — R6889 Other general symptoms and signs: Secondary | ICD-10-CM

## 2014-05-20 DIAGNOSIS — R55 Syncope and collapse: Secondary | ICD-10-CM

## 2014-05-20 DIAGNOSIS — G932 Benign intracranial hypertension: Secondary | ICD-10-CM

## 2014-05-20 DIAGNOSIS — G620 Drug-induced polyneuropathy: Secondary | ICD-10-CM

## 2014-05-20 DIAGNOSIS — R569 Unspecified convulsions: Secondary | ICD-10-CM

## 2014-05-20 DIAGNOSIS — IMO0001 Reserved for inherently not codable concepts without codable children: Secondary | ICD-10-CM

## 2014-05-20 NOTE — Progress Notes (Signed)
Follow-up Visit   Date: 05/20/2014    Erica [name] MRN: [id number] DOB: July 29, 1975   Interim History: Erica [name] is a 39 y.o. 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    History of present illness: Starting in 2010 after her second course of chemotherapy she developed numbness/tingling of the arm and legs, described as her hands are asleep. She has burning sensation and shaking of her hands. Activity worsens pain and she has difficulty with fine motor tasks such as buttoning, tying shoe laces, and braiding her daughter's hair. She falls about once per month because her legs give out. She was started on Lyrica 50mg  TID around March which has helped slightly.   Of note, she history of pseudotumor cerebri, followed by ophthalmology, and currently takes diamox 500mg  BID. 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 She had lumbar puncture performed in the seated position with documented OP of 43 and 32cc of fluid removed. She had a second lumbar puncture in 2014 at which time OP was 18 mmHg.  - Follow-up 10/15/2013:  No significant difference in pain since increasing Lyrica to 100mg  BID.    - Follow-up 01/18/2014:  She had one interval emergency room visit due to severe headache and had LP which showed OP of 26.  Since then, headache has been under control.  Her leg pain continues to be problematic with no lasting benefit despite increase Lyrica to 200mg  BID.    - UPDATE 03/03/2014:  Doing better after increasing Lyrica to 200mg  TID and lidocaine ointment.  She reports less shaking of her hand and  reports greater than 50% improvement of pain with lidocaine.  She reports having new spells of syncope, which seems to only occur when she is sitting. There is loss of consciousness, lasting only a few seconds.  Following the spells, there is no fatigue, confusion, or weakness. On one ocassion, she felt herself leaning to the side and caught herself when at therapy. She is unaware of the spells.   - UPDATE 03/20/2015:  She comes today with complains of new eye drainage, right eye lid swelling, and "whoosing" sound in her ear which started today.  She also complains that left hand tremors are worse over the past few days.  She was started on Lasix due to leg swelling which has helped, but this had made her potassium come down.  She is taking Lyrica 200mg  TID and lidocaine ointment which helps.  She continues to have syncopal spells, recent sleep study showed OSA and she justed CPAP last night.    Medications:  Current Outpatient Prescriptions on File Prior to Visit  Medication Sig Dispense Refill  . acetaZOLAMIDE (DIAMOX) 250 MG tablet 2 by mouth three times daily    . albuterol (PROVENTIL HFA;VENTOLIN HFA) 108 (90 BASE) MCG/ACT inhaler Inhale 2 puffs into the lungs every 4 (four) hours as needed for wheezing or shortness of breath (((PLAN A))). 18 g 5  . albuterol (PROVENTIL) (2.5 MG/3ML) 0.083% nebulizer solution Take 3 mLs (2.5 mg total) by nebulization every 4 (four) hours as needed for wheezing or shortness of breath (((PLAN B))). 300 mL 5  . amLODipine-olmesartan (AZOR) 10-40 MG per tablet Take 1 tablet by  mouth daily.    . Biotin 10 MG TABS Take 1 tablet by mouth daily.    . cloNIDine (CATAPRES) 0.3 MG tablet Take 1 tablet (0.3 mg total) by mouth 2 (two) times daily. 90 tablet 3  . dextromethorphan-guaiFENesin (MUCINEX DM) 30-600 MG per 12 hr tablet Take 1 tablet by mouth every 12 (twelve) hours as needed for cough.    . EPINEPHrine 0.3 mg/0.3 mL IJ SOAJ injection Inject 0.3 mLs (0.3 mg total)  into the muscle once. 2 Device 1  . furosemide (LASIX) 40 MG tablet Take 1 tablet (40 mg total) by mouth daily as needed. 30 tablet 6  . hyoscyamine (LEVSIN SL) 0.125 MG SL tablet Place 1 tablet (0.125 mg total) under the tongue every 8 (eight) hours as needed for cramping. 30 tablet 3  . isosorbide mononitrate (IMDUR) 60 MG 24 hr tablet Take 1 tablet (60 mg total) by mouth daily. 30 tablet 11  . LORazepam (ATIVAN) 0.5 MG tablet Take 1 tablet (0.5 mg total) by mouth every 8 (eight) hours as needed for anxiety. 60 tablet 2  . metoCLOPramide (REGLAN) 10 MG tablet Take 1 tablet (10 mg total) by mouth 4 (four) times daily -  with meals and at bedtime. 120 tablet 3  . mometasone (NASONEX) 50 MCG/ACT nasal spray Place 2 sprays into the nose 2 (two) times daily.    . mometasone-formoterol (DULERA) 200-5 MCG/ACT AERO Inhale 2 puffs into the lungs 2 (two) times daily. 13 g 5  . montelukast (SINGULAIR) 10 MG tablet Take 1 tablet (10 mg total) by mouth at bedtime. 30 tablet 5  . nebivolol (BYSTOLIC) 10 MG tablet Take 1 tablet (10 mg total) by mouth daily. 90 tablet 3  . nitroGLYCERIN (NITROSTAT) 0.4 MG SL tablet Place 1 tablet (0.4 mg total) under the tongue every 5 (five) minutes as needed for chest pain. 90 tablet 3  . ondansetron (ZOFRAN-ODT) 4 MG disintegrating tablet Take 1 tablet (4 mg total) by mouth every 6 (six) hours as needed for nausea or vomiting. 20 tablet 5  . pantoprazole (PROTONIX) 40 MG tablet Take 1 tablet (40 mg total) by mouth 2 (two) times daily before a meal. 60 tablet 3  . potassium chloride SA (K-DUR,KLOR-CON) 20 MEQ tablet Take 20 mEq by mouth 3 (three) times daily.    . pregabalin (LYRICA) 200 MG capsule Take 1 capsule (200 mg total) by mouth 3 (three) times daily. 90 capsule 5  . Respiratory Therapy Supplies (FLUTTER) DEVI Use as directed. 1 each 0  . sucralfate (CARAFATE) 1 G tablet Take 1 tablet (1 g total) by mouth 4 (four) times daily. 30 tablet 0  . traMADol (ULTRAM) 50 MG  tablet 1-2 every 4 hours as needed for cough or pain    . triamterene-hydrochlorothiazide (MAXZIDE-25) 37.5-25 MG per tablet Take 1 tablet by mouth daily.     No current facility-administered medications on file prior to visit.    Allergies:  Allergies  Allergen Reactions  . Aspirin Shortness Of Breath and Palpitations    Pt can take ibuprofen without reaction  . Doxycycline Anaphylaxis  . Penicillins Shortness Of Breath and Palpitations  . Kiwi Extract Itching and Swelling    Lip swelling  . Strawberry Hives     Review of Systems:  CONSTITUTIONAL: No fevers, chills, night sweats, or weight loss.   EYES: No visual changes or eye pain ENT: No hearing changes.  No history of nose bleeds.   RESPIRATORY: No cough, wheezing and shortness  of breath.   CARDIOVASCULAR: Negative for chest pain, and palpitations.   GI: Negative for abdominal discomfort, blood in stools or black stools.  No recent change in bowel habits.   GU:  No history of incontinence.   MUSCLOSKELETAL: No history of joint pain or swelling.  No myalgias.   SKIN: Negative for lesions, rash, and itching.   ENDOCRINE: Negative for cold or heat intolerance, polydipsia or goiter.   PSYCH:  No depression or anxiety symptoms.   NEURO: As Above.   Vital Signs:  BP 148/80 mmHg  Pulse 68  Ht 5\' 7"  (1.702 m)  Wt 200 lb 6 oz (90.89 kg)  BMI 31.38 kg/m2  LMP 04/20/2014  General:  Right eyelid with edema,?stye  Neurological Exam: MENTAL STATUS including orientation to time, place, person is normal.  Speech is not dysarthric.  CRANIAL NERVES:  No papilledema on fundoscopic exam, disc appears sharp bilaterally.  She reports being blind in the right eye - only able to see light.  Visual field testing of the left eye is intact.  Pupils equal round and reactive to light.  Normal conjugate, extra-ocular eye movements in all directions of gaze.  Right ptosis due to swelling.  MOTOR:  Motor strength is 5/5 in all extremities. Left  hand with intention tremor when hands outstretched.  MSRs:  Reflexes are 2+/4 throughout.  SENSORY:  Intact to vibration throughout  COORDINATION/GAIT:   Gait narrow based and stable.   Data: Lab Results  Component Value Date   TSH 0.90 07/14/2013   Lab Results  Component Value Date   VITAMINB12 605 10/15/2013  Copper 124  CSF 09/18/2011: R269 W0 G65 P17, CSF cytology-neg OP 43, 32cc removed   LP 02/05/2013: OP 18, CP 17 after 21 cc removed  LP 10/25/2013:  OP 26, CP 5 after 15 cc removed  Labs 09/17/2011: vitamin B12 725, RPR NR   CT head 02/05/2013: No acute abnormality   MRI brain 08/13/2010:  1. No evidence for metastatic disease to the brain.  2. Scattered subcortical T2 and FLAIR hyperintensities are slightly greater than expected for age. The finding is nonspecific  but can be seen in the setting of chronic microvascular ischemia, a demyelinating process such as multiple sclerosis, vasculitis, complicated migraine headaches, or as the sequelae of a prior infectious or inflammatory process. The lesions could be related to prior brain radiation if this has been performed.    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  Clinically stable. Symptoms manifesting with painful parsthesias of her hands and feet which started following chemotherapy with docetaxel/cyclophosphamide (2010).  Clinically, she reports having improvement of pain after increasing Lyrica to 200mg  TID and adding lidocaine ointment to her feet.    Syncopal spells These episodes are very brief with quick return back to baseline.  They do not sound like seizures, although absence seizures cannot be excluded.  Cardiac work-up is negative, so will proceed with routine EEG. 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  No betablocks given history of asthma  Right eyelid swelling and hearing changes ?  viral infection Encouraged her to follow-up with her PCP OK to use ice/warm compresses and take tylenol.   Return to clinic in 2 months    The duration of this appointment visit was 30 minutes of face-to-face time with the patient.  Greater than 50% of this time was spent in counseling, explanation of diagnosis, planning of further management, and coordination of care.   Thank you for allowing me to participate in patient's care.  If I can answer any additional questions, I would be pleased to do so.    Sincerely,    Elinora Weigand K. Posey Pronto, DO

## 2014-05-20 NOTE — Patient Instructions (Addendum)
1.  Routine EEG 2.  Continue medications as you are taking  3.  Please follow-up with Dr. [name] for your ear and eye discomfort.  In the meantime, use ice or warm compressions.   4.  Please follow-up with your eye doctor as soon as you can 5.  Return to clinic in 3 months

## 2014-05-23 LAB — CYTOLOGY - PAP

## 2014-05-26 ENCOUNTER — Ambulatory Visit: Payer: Medicaid - Out of State | Admitting: Internal Medicine

## 2014-05-30 ENCOUNTER — Telehealth (HOSPITAL_COMMUNITY): Payer: Self-pay | Admitting: *Deleted

## 2014-05-30 ENCOUNTER — Other Ambulatory Visit: Payer: Medicaid - Out of State

## 2014-05-30 NOTE — Telephone Encounter (Signed)
Called patient and gave results of Pap smear. Let her know that her Pap smear was normal and HPV negative. Since patient has no history of an abnormal Pap smear let her know her next Pap smear is due in 5 years. Patient verbalized understanding.

## 2014-06-02 ENCOUNTER — Ambulatory Visit (HOSPITAL_BASED_OUTPATIENT_CLINIC_OR_DEPARTMENT_OTHER): Payer: Medicaid - Out of State

## 2014-06-02 DIAGNOSIS — C50412 Malignant neoplasm of upper-outer quadrant of left female breast: Secondary | ICD-10-CM

## 2014-06-02 DIAGNOSIS — Z5111 Encounter for antineoplastic chemotherapy: Secondary | ICD-10-CM

## 2014-06-02 MED ORDER — GOSERELIN ACETATE 3.6 MG ~~LOC~~ IMPL
3.6000 mg | DRUG_IMPLANT | Freq: Once | SUBCUTANEOUS | Status: AC
  Administered 2014-06-02: 3.6 mg via SUBCUTANEOUS
  Filled 2014-06-02: qty 3.6

## 2014-06-02 NOTE — Patient Instructions (Signed)
Goserelin injection What is this medicine? GOSERELIN (GOE se rel in) is similar to a hormone found in the body. It lowers the amount of sex hormones that the body makes. Men will have lower testosterone levels and women will have lower estrogen levels while taking this medicine. In men, this medicine is used to treat prostate cancer; the injection is either given once per month or once every 12 weeks. A once per month injection (only) is used to treat women with endometriosis, dysfunctional uterine bleeding, or advanced breast cancer. This medicine may be used for other purposes; ask your health care provider or pharmacist if you have questions. COMMON BRAND NAME(S): Zoladex What should I tell my health care provider before I take this medicine? They need to know if you have any of these conditions (some only apply to women): -diabetes -heart disease or previous heart attack -high blood pressure -high cholesterol -kidney disease -osteoporosis or low bone density -problems passing urine -spinal cord injury -stroke -tobacco smoker -an unusual or allergic reaction to goserelin, hormone therapy, other medicines, foods, dyes, or preservatives -pregnant or trying to get pregnant -breast-feeding How should I use this medicine? This medicine is for injection under the skin. It is given by a health care professional in a hospital or clinic setting. Men receive this injection once every 4 weeks or once every 12 weeks. Women will only receive the once every 4 weeks injection. Talk to your pediatrician regarding the use of this medicine in children. Special care may be needed. Overdosage: If you think you have taken too much of this medicine contact a poison control center or emergency room at once. NOTE: This medicine is only for you. Do not share this medicine with others. What if I miss a dose? It is important not to miss your dose. Call your doctor or health care professional if you are unable to  keep an appointment. What may interact with this medicine? -female hormones like estrogen -herbal or dietary supplements like black cohosh, chasteberry, or DHEA -female hormones like testosterone -prasterone This list may not describe all possible interactions. Give your health care provider a list of all the medicines, herbs, non-prescription drugs, or dietary supplements you use. Also tell them if you smoke, drink alcohol, or use illegal drugs. Some items may interact with your medicine. What should I watch for while using this medicine? Visit your doctor or health care professional for regular checks on your progress. Your symptoms may appear to get worse during the first weeks of this therapy. Tell your doctor or healthcare professional if your symptoms do not start to get better or if they get worse after this time. Your bones may get weaker if you take this medicine for a long time. If you smoke or frequently drink alcohol you may increase your risk of bone loss. A family history of osteoporosis, chronic use of drugs for seizures (convulsions), or corticosteroids can also increase your risk of bone loss. Talk to your doctor about how to keep your bones strong. This medicine should stop regular monthly menstration in women. Tell your doctor if you continue to menstrate. Women should not become pregnant while taking this medicine or for 12 weeks after stopping this medicine. Women should inform their doctor if they wish to become pregnant or think they might be pregnant. There is a potential for serious side effects to an unborn child. Talk to your health care professional or pharmacist for more information. Do not breast-feed an infant while taking   this medicine. Men should inform their doctors if they wish to father a child. This medicine may lower sperm counts. Talk to your health care professional or pharmacist for more information. What side effects may I notice from receiving this  medicine? Side effects that you should report to your doctor or health care professional as soon as possible: -allergic reactions like skin rash, itching or hives, swelling of the face, lips, or tongue -bone pain -breathing problems -changes in vision -chest pain -feeling faint or lightheaded, falls -fever, chills -pain, swelling, warmth in the leg -pain, tingling, numbness in the hands or feet -signs and symptoms of low blood pressure like dizziness; feeling faint or lightheaded, falls; unusually weak or tired -stomach pain -swelling of the ankles, feet, hands -trouble passing urine or change in the amount of urine -unusually high or low blood pressure -unusually weak or tired Side effects that usually do not require medical attention (report to your doctor or health care professional if they continue or are bothersome): -change in sex drive or performance -changes in breast size in both males and females -changes in emotions or moods -headache -hot flashes -irritation at site where injected -loss of appetite -skin problems like acne, dry skin -vaginal dryness This list may not describe all possible side effects. Call your doctor for medical advice about side effects. You may report side effects to FDA at 1-800-FDA-1088. Where should I keep my medicine? This drug is given in a hospital or clinic and will not be stored at home. NOTE: This sheet is a summary. It may not cover all possible information. If you have questions about this medicine, talk to your doctor, pharmacist, or health care provider.  2015, [employer]. (2013-05-25 11:10:35)  

## 2014-06-18 ENCOUNTER — Encounter (HOSPITAL_COMMUNITY): Payer: Self-pay | Admitting: Emergency Medicine

## 2014-06-18 ENCOUNTER — Emergency Department (HOSPITAL_COMMUNITY): Payer: Medicare Other

## 2014-06-18 ENCOUNTER — Emergency Department (HOSPITAL_COMMUNITY)
Admission: EM | Admit: 2014-06-18 | Discharge: 2014-06-18 | Disposition: A | Discharge status: Home / Self Care | Payer: Medicare Other | Attending: Emergency Medicine | Admitting: Emergency Medicine

## 2014-06-18 DIAGNOSIS — Z880 Allergy status to penicillin: Secondary | ICD-10-CM | POA: Insufficient documentation

## 2014-06-18 DIAGNOSIS — Z8669 Personal history of other diseases of the nervous system and sense organs: Secondary | ICD-10-CM | POA: Diagnosis not present

## 2014-06-18 DIAGNOSIS — Z9981 Dependence on supplemental oxygen: Secondary | ICD-10-CM | POA: Insufficient documentation

## 2014-06-18 DIAGNOSIS — G4733 Obstructive sleep apnea (adult) (pediatric): Secondary | ICD-10-CM | POA: Insufficient documentation

## 2014-06-18 DIAGNOSIS — R079 Chest pain, unspecified: Secondary | ICD-10-CM | POA: Insufficient documentation

## 2014-06-18 DIAGNOSIS — K219 Gastro-esophageal reflux disease without esophagitis: Secondary | ICD-10-CM | POA: Diagnosis not present

## 2014-06-18 DIAGNOSIS — I10 Essential (primary) hypertension: Secondary | ICD-10-CM | POA: Diagnosis not present

## 2014-06-18 DIAGNOSIS — R0602 Shortness of breath: Secondary | ICD-10-CM | POA: Diagnosis present

## 2014-06-18 DIAGNOSIS — Z862 Personal history of diseases of the blood and blood-forming organs and certain disorders involving the immune mechanism: Secondary | ICD-10-CM | POA: Diagnosis not present

## 2014-06-18 DIAGNOSIS — J45901 Unspecified asthma with (acute) exacerbation: Secondary | ICD-10-CM | POA: Diagnosis not present

## 2014-06-18 DIAGNOSIS — Z79899 Other long term (current) drug therapy: Secondary | ICD-10-CM | POA: Diagnosis not present

## 2014-06-18 DIAGNOSIS — Z853 Personal history of malignant neoplasm of breast: Secondary | ICD-10-CM | POA: Insufficient documentation

## 2014-06-18 DIAGNOSIS — J45909 Unspecified asthma, uncomplicated: Secondary | ICD-10-CM | POA: Diagnosis not present

## 2014-06-18 LAB — CBC
HEMATOCRIT: 39.5 % (ref 36.0–46.0)
Hemoglobin: 12.8 g/dL (ref 12.0–15.0)
MCH: 28.4 pg (ref 26.0–34.0)
MCHC: 32.4 g/dL (ref 30.0–36.0)
MCV: 87.8 fL (ref 78.0–100.0)
PLATELETS: 213 10*3/uL (ref 150–400)
RBC: 4.5 MIL/uL (ref 3.87–5.11)
RDW: 13.3 % (ref 11.5–15.5)
WBC: 4.9 10*3/uL (ref 4.0–10.5)

## 2014-06-18 LAB — BASIC METABOLIC PANEL
ANION GAP: 9 (ref 5–15)
BUN: 10 mg/dL (ref 6–23)
CALCIUM: 9.1 mg/dL (ref 8.4–10.5)
CHLORIDE: 107 mmol/L (ref 96–112)
CO2: 26 mmol/L (ref 19–32)
Creatinine, Ser: 0.87 mg/dL (ref 0.50–1.10)
GFR calc Af Amer: >90 mL/min (ref >90)
GFR calc non Af Amer: 82 mL/min — ABNORMAL LOW (ref >90)
GLUCOSE: 105 mg/dL — AB (ref 70–99)
Potassium: 3.3 mmol/L — ABNORMAL LOW (ref 3.5–5.1)
Sodium: 142 mmol/L (ref 135–145)

## 2014-06-18 LAB — I-STAT TROPONIN, ED: Troponin i, poc: 0 ng/mL (ref 0.00–0.08)

## 2014-06-18 MED ORDER — METHYLPREDNISOLONE SODIUM SUCC 125 MG IJ SOLR
125.0000 mg | Freq: Once | INTRAMUSCULAR | Status: AC
  Administered 2014-06-18: 125 mg via INTRAVENOUS
  Filled 2014-06-18: qty 2

## 2014-06-18 MED ORDER — IPRATROPIUM-ALBUTEROL 0.5-2.5 (3) MG/3ML IN SOLN
3.0000 mL | Freq: Once | RESPIRATORY_TRACT | Status: AC
  Administered 2014-06-18: 3 mL via RESPIRATORY_TRACT
  Filled 2014-06-18: qty 3

## 2014-06-18 MED ORDER — ALBUTEROL SULFATE (2.5 MG/3ML) 0.083% IN NEBU
5.0000 mg | INHALATION_SOLUTION | Freq: Once | RESPIRATORY_TRACT | Status: AC
  Administered 2014-06-18: 5 mg via RESPIRATORY_TRACT
  Filled 2014-06-18: qty 6

## 2014-06-18 MED ORDER — PREDNISONE 20 MG PO TABS: ORAL_TABLET | ORAL | Status: DC

## 2014-06-18 NOTE — ED Provider Notes (Addendum)
CSN: [id number]     Arrival date & time 06/18/14  1247 History   First MD Initiated Contact with Patient 06/18/14 1341     Chief Complaint  Patient presents with  . Asthma  . Shortness of Breath     (Consider location/radiation/quality/duration/timing/severity/associated sxs/prior Treatment) HPI Comments: Patient presents with shortness of breath and wheezing. She has a history of asthma and says that she feels like her asthma is flaring up. She says that she's had trouble for the last 2 days. She also feels some tightness across her chest which is typical when her asthma flares up. She has a dry cough but no sputum production. She denies any leg pain or swelling. She denies any fevers. She's been using her nebulizer machines at home without improvement of symptoms. She denies any runny nose congestion or other URI type symptoms.  Patient is a 39 y.o. female presenting with asthma and shortness of breath.  Asthma Associated symptoms include shortness of breath. Pertinent negatives include no chest pain, no abdominal pain and no headaches.  Shortness of Breath Associated symptoms: cough and wheezing   Associated symptoms: no abdominal pain, no chest pain, no diaphoresis, no fever, no headaches, no rash and no vomiting     Past Medical History  Diagnosis Date  . Breast cancer 2010    a. locally advanced left breast carcinoma diagnosed in 2010 and treated with neoadjuvant chemotherapy with docetaxel and cyclophosphamide as well as paclitaxel. This was followed by radiation therapy which was completed in 2011.  . Asthma   . Hypertension   . GERD (gastroesophageal reflux disease)   . Impaired glucose tolerance 04/13/2011  . Anemia, unspecified 04/13/2011  . Asthma 09/27/2010  . Bloating 03/18/2011  . 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 Lymphadenitis, chronic     restricted LEFT extremity  . Dyspnea on exertion     a. 01/2013 Lexi MV: EF 54%, no ischemia/infarct;  b. 05/2013 Echo: EF  60-65%, no rwma, Gr 2 DD;  b.   . OSA (obstructive sleep apnea) 05/04/2014    AHI 31/hr now on CPAP at 12cm H2O   Past Surgical History  Procedure Laterality Date  . Hand surgery  2012    right; tendon release  . Breast lumpectomy  09/2008    left  . Carpal tunnel release Left 2011  . Tubal ligation  05/11/2011    Procedure: POST PARTUM TUBAL LIGATION;  Surgeon: Emeterio Reeve, MD;  Location: [facility];  Service: Gynecology;  Laterality: Bilateral;  Induced for HTN  . Lymph node dissection  2010    left breast; 2 wks after breast lumpectomy   Family History  Problem Relation Age of Onset  . Diabetes Maternal Grandmother   . Heart disease Maternal Grandmother   . Hypertension Maternal Grandmother   . Cancer Neg Hx   . Alcohol abuse Neg Hx   . Early death Neg Hx   . Hyperlipidemia Neg Hx   . Kidney disease Neg Hx   . Stroke Neg Hx   . Colon cancer Neg Hx    History  Substance Use Topics  . Smoking status: Never Smoker   . Smokeless tobacco: Never Used  . Alcohol Use: 0.0 oz/week     Comment: occasionally   OB History    Gravida Para Term Preterm AB TAB SAB Ectopic Multiple Living   5 4 4  1  1   4      Review of Systems  Constitutional: Positive for fatigue.  Negative for fever, chills and diaphoresis.  HENT: Negative for congestion, rhinorrhea and sneezing.   Eyes: Negative.   Respiratory: Positive for cough, chest tightness, shortness of breath and wheezing.   Cardiovascular: Negative for chest pain and leg swelling.  Gastrointestinal: Negative for nausea, vomiting, abdominal pain, diarrhea and blood in stool.  Genitourinary: Negative for frequency, hematuria, flank pain and difficulty urinating.  Musculoskeletal: Negative for back pain and arthralgias.  Skin: Negative for rash.  Neurological: Negative for dizziness, speech difficulty, weakness, numbness and headaches.      Allergies  Aspirin; Doxycycline; Penicillins; Kiwi extract; and Strawberry  Home Medications    Prior to Admission medications   Medication Sig Start Date End Date Taking? Authorizing Provider  acetaminophen (TYLENOL) 500 MG tablet Take 1,000 mg by mouth every 6 (six) hours as needed for moderate pain.   Yes Historical Provider, MD  acetaZOLAMIDE (DIAMOX) 250 MG tablet Take 500 mg by mouth 3 (three) times daily. 2 by mouth three times daily   Yes Historical Provider, MD  albuterol (PROVENTIL HFA;VENTOLIN HFA) 108 (90 BASE) MCG/ACT inhaler Inhale 2 puffs into the lungs every 4 (four) hours as needed for wheezing or shortness of breath (((PLAN A))). 01/26/14  Yes Tammy S Parrett, NP  albuterol (PROVENTIL) (2.5 MG/3ML) 0.083% nebulizer solution Take 3 mLs (2.5 mg total) by nebulization every 4 (four) hours as needed for wheezing or shortness of breath (((PLAN B))). 01/26/14  Yes Tammy S Parrett, NP  amLODipine-olmesartan (AZOR) 10-40 MG per tablet Take 1 tablet by mouth daily.   Yes Historical Provider, MD  Biotin 10 MG TABS Take 1 tablet by mouth daily.   Yes Historical Provider, MD  cloNIDine (CATAPRES) 0.3 MG tablet Take 1 tablet (0.3 mg total) by mouth 2 (two) times daily. 08/24/13  Yes [name], MD  dextromethorphan-guaiFENesin [facility] DM) 30-600 MG per 12 hr tablet Take 1 tablet by mouth every 12 (twelve) hours as needed for cough.   Yes Historical Provider, MD  EPINEPHrine 0.3 mg/0.3 mL IJ SOAJ injection Inject 0.3 mLs (0.3 mg total) into the muscle once. Patient taking differently: Inject 0.3 mg into the muscle daily as needed. Allergic reaction 01/28/14  Yes [name], MD  furosemide (LASIX) 40 MG tablet Take 1 tablet (40 mg total) by mouth daily as needed. Patient taking differently: Take 40 mg by mouth daily as needed for fluid.  03/14/14  Yes [name], MD  hyoscyamine (LEVSIN SL) 0.125 MG SL tablet Place 1 tablet (0.125 mg total) under the tongue every 8 (eight) hours as needed for cramping. 09/02/13  Yes Jerene Bears, MD  isosorbide mononitrate (IMDUR) 60 MG 24  hr tablet Take 1 tablet (60 mg total) by mouth daily. 02/08/14  Yes [name], MD  LORazepam (ATIVAN) 0.5 MG tablet Take 1 tablet (0.5 mg total) by mouth every 8 (eight) hours as needed for anxiety. 01/31/14  Yes Laurie Panda, NP  metoCLOPramide (REGLAN) 10 MG tablet Take 1 tablet (10 mg total) by mouth 4 (four) times daily -  with meals and at bedtime. 01/26/13  Yes Jerene Bears, MD  mometasone (NASONEX) 50 MCG/ACT nasal spray Place 2 sprays into the nose 2 (two) times daily.   Yes Historical Provider, MD  mometasone-formoterol (DULERA) 200-5 MCG/ACT AERO Inhale 2 puffs into the lungs 2 (two) times daily. 04/19/14  Yes Tammy S Parrett, NP  montelukast (SINGULAIR) 10 MG tablet Take 1 tablet (10 mg total) by mouth at bedtime. 01/26/14  Yes Tammy S Parrett, NP  nebivolol (BYSTOLIC) 10 MG tablet Take 1 tablet (10 mg total) by mouth daily. 05/03/14  Yes [name], MD  nitroGLYCERIN (NITROSTAT) 0.4 MG SL tablet Place 1 tablet (0.4 mg total) under the tongue every 5 (five) minutes as needed for chest pain. 08/24/13  Yes [name], MD  ondansetron (ZOFRAN-ODT) 4 MG disintegrating tablet Take 1 tablet (4 mg total) by mouth every 6 (six) hours as needed for nausea or vomiting. 01/28/14  Yes [name], MD  pantoprazole (PROTONIX) 40 MG tablet Take 1 tablet (40 mg total) by mouth 2 (two) times daily before a meal. 09/02/13  Yes Jerene Bears, MD  potassium chloride SA (K-DUR,KLOR-CON) 20 MEQ tablet Take 20 mEq by mouth 3 (three) times daily.   Yes Historical Provider, MD  pregabalin (LYRICA) 200 MG capsule Take 1 capsule (200 mg total) by mouth 3 (three) times daily. 01/18/14  Yes Donika K Patel, DO  sucralfate (CARAFATE) 1 G tablet Take 1 tablet (1 g total) by mouth 4 (four) times daily. 03/07/12  Yes Lacretia Leigh, MD  traMADol (ULTRAM) 50 MG tablet Take 50-100 mg by mouth every 6 (six) hours as needed. 1-2 every 4 hours as needed for cough or pain   Yes Historical Provider, MD   triamterene-hydrochlorothiazide (MAXZIDE-25) 37.5-25 MG per tablet Take 1 tablet by mouth daily.   Yes Historical Provider, MD  predniSONE (DELTASONE) 20 MG tablet 3 tabs po day one, then 2 po daily x 4 days 06/18/14   Malvin Johns, MD  Respiratory Therapy Supplies (FLUTTER) DEVI Use as directed. 04/19/14   Tammy S Parrett, NP   BP 182/98 mmHg  Pulse 94  Temp(Src) 98.3 F (36.8 C) (Oral)  Resp 20  SpO2 100%  LMP 04/20/2014 Physical Exam  Constitutional: She is oriented to person, place, and time. She appears well-developed and well-nourished.  HENT:  Head: Normocephalic and atraumatic.  Eyes: Pupils are equal, round, and reactive to light.  Neck: Normal range of motion. Neck supple.  Cardiovascular: Normal rate, regular rhythm and normal heart sounds.   Pulmonary/Chest: Effort normal. No respiratory distress. She has wheezes (mouth extremely wheezing bilaterally). She has no rales. She exhibits no tenderness.  Abdominal: Soft. Bowel sounds are normal. There is no tenderness. There is no rebound and no guarding.  Musculoskeletal: Normal range of motion. She exhibits no edema.  No calf tenderness  Lymphadenopathy:    She has no cervical adenopathy.  Neurological: She is alert and oriented to person, place, and time.  Skin: Skin is warm and dry. No rash noted.  Psychiatric: She has a normal mood and affect.    ED Course  Procedures (including critical care time) Labs Review Labs Reviewed  BASIC METABOLIC PANEL - Abnormal; Notable for the following:    Potassium 3.3 (*)    Glucose, Bld 105 (*)    GFR calc non Af Amer 82 (*)    All other components within normal limits  CBC  I-STAT TROPOININ, ED    Imaging Review Dg Chest 2 View (if Patient Has Fever And/or Copd)  06/18/2014   CLINICAL DATA:  Acute shortness of breath, history of asthma and hypertension.  EXAM: CHEST  2 VIEW  COMPARISON:  04/04/2014  FINDINGS: The heart size and mediastinal contours are within normal limits.  Both lungs are clear. The visualized skeletal structures are unremarkable.  IMPRESSION: No active cardiopulmonary disease.   Electronically Signed   By: Jerilynn Mages.  Shick M.D.  On: 06/18/2014 14:25     EKG Interpretation   Date/Time:  Saturday June 18 2014 13:48:23 EDT Ventricular Rate:  88 PR Interval:  148 QRS Duration: 75 QT Interval:  367 QTC Calculation: 444 R Axis:   41 Text Interpretation:  Sinus rhythm Borderline T wave abnormalities since  last tracing no significant change Confirmed by Sidharth Leverette  MD, Rubie Ficco ([id number])  on 06/18/2014 3:12:57 PM      MDM   Final diagnoses:  Asthma exacerbation    Patient is given 2 nebulizer treatments in the ED as well as solumedrol. She's feeling much better after this. She's talking in full sentences and has no increased work of breathing. Her oxygen saturations are normal. Her chest x-ray does not show evidence of pneumonia. She was discharged from good condition. She was given a prescription for prednisone. She will continue using her albuterol nebulizers as needed. She will follow-up with her primary care physician and return here as needed for any worsening symptoms.    Malvin Johns, MD 06/18/14 [place], MD 06/18/14 [phone]

## 2014-06-18 NOTE — Discharge Instructions (Signed)

## 2014-06-18 NOTE — ED Notes (Signed)
Pt reports SOB starting last night. Used albuterol inhaler this morning with no alleviation of symptoms. C/o heaviness "of lungs" when breathing in and says "it just hurts to breathe." Denies tobacco use. Denies fevers, chest pain. No wheezing when auscultated. RR even/unlabored.

## 2014-06-20 ENCOUNTER — Other Ambulatory Visit: Payer: Self-pay | Admitting: Internal Medicine

## 2014-06-24 ENCOUNTER — Ambulatory Visit: Payer: Medicaid - Out of State | Admitting: Neurology

## 2014-06-28 ENCOUNTER — Ambulatory Visit (INDEPENDENT_AMBULATORY_CARE_PROVIDER_SITE_OTHER): Payer: Medicare Other | Admitting: Neurology

## 2014-06-28 DIAGNOSIS — R55 Syncope and collapse: Secondary | ICD-10-CM

## 2014-06-30 ENCOUNTER — Ambulatory Visit (HOSPITAL_BASED_OUTPATIENT_CLINIC_OR_DEPARTMENT_OTHER): Payer: Medicare Other

## 2014-06-30 DIAGNOSIS — Z5111 Encounter for antineoplastic chemotherapy: Secondary | ICD-10-CM

## 2014-06-30 DIAGNOSIS — C50412 Malignant neoplasm of upper-outer quadrant of left female breast: Secondary | ICD-10-CM

## 2014-06-30 MED ORDER — GOSERELIN ACETATE 3.6 MG ~~LOC~~ IMPL
3.6000 mg | DRUG_IMPLANT | Freq: Once | SUBCUTANEOUS | Status: AC
  Administered 2014-06-30: 3.6 mg via SUBCUTANEOUS
  Filled 2014-06-30: qty 3.6

## 2014-06-30 NOTE — Procedures (Signed)
ELECTROENCEPHALOGRAM REPORT  Date of Study: 06/28/2014  Patient's Name: Erica [name] MRN: [id number] Date of Birth: October 14, 1975  Referring Provider: Dr. Narda Amber  Clinical History: This is a 39 year old woman with recurrent episodes of loss of consciousness  Medications: Lyrica, Ativan, Tramadol  Technical Summary: A multichannel digital EEG recording measured by the international 10-20 system with electrodes applied with paste and impedances below 5000 ohms performed in our laboratory with EKG monitoring in an awake and asleep patient.  Hyperventilation and photic stimulation were performed.  The digital EEG was referentially recorded, reformatted, and digitally filtered in a variety of bipolar and referential montages for optimal display.    Description: The patient is awake and asleep during the recording.  During maximal wakefulness, there is a symmetric, medium voltage 9.5 Hz posterior dominant rhythm that attenuates with eye opening.  The record is symmetric.  During drowsiness and stage I sleep, there is an increase in theta slowing of the background and occasional vertex waves seen.  Hyperventilation and photic stimulation did not elicit any abnormalities.  There were no epileptiform discharges or electrographic seizures seen.    EKG lead was unremarkable.  Impression: This awake and asleep EEG is normal.    Clinical Correlation: A normal EEG does not exclude a clinical diagnosis of epilepsy.  Clinical correlation is advised.   Ellouise Newer, M.D.

## 2014-07-04 ENCOUNTER — Ambulatory Visit: Payer: Medicare Other | Admitting: Cardiology

## 2014-07-06 ENCOUNTER — Encounter: Payer: Self-pay | Admitting: Cardiology

## 2014-07-11 ENCOUNTER — Other Ambulatory Visit: Payer: Self-pay

## 2014-07-11 ENCOUNTER — Ambulatory Visit: Payer: Self-pay | Admitting: Nurse Practitioner

## 2014-07-18 ENCOUNTER — Ambulatory Visit (INDEPENDENT_AMBULATORY_CARE_PROVIDER_SITE_OTHER): Payer: Medicare Other | Admitting: Internal Medicine

## 2014-07-18 ENCOUNTER — Encounter: Payer: Self-pay | Admitting: Internal Medicine

## 2014-07-18 VITALS — BP 142/96 | HR 100 | Ht 67.0 in | Wt 200.6 lb

## 2014-07-18 DIAGNOSIS — J4540 Moderate persistent asthma, uncomplicated: Secondary | ICD-10-CM | POA: Diagnosis not present

## 2014-07-18 DIAGNOSIS — R059 Cough, unspecified: Secondary | ICD-10-CM

## 2014-07-18 DIAGNOSIS — R05 Cough: Secondary | ICD-10-CM | POA: Diagnosis not present

## 2014-07-18 NOTE — Progress Notes (Signed)
Subjective:    Patient ID: Erica [name], female    DOB: 1975/08/07   MRN: [id number]    Brief patient profile:  66 yobf with asthma all her life much worse since around 07/2010 referred by Dr [name] to pulmonary clinic in Sept 2012 Hx of Breast Cancer 05/2008    History of Present Illness  12/18/2010 Initial pulmonary office eval on acei and  advair cc dtc asthma x 4 months but baseline = excess saba use "even when better" at least twice daily despite daily advair, worse at hs in fact  used saba 3 x last night.  On chemo /RT for breast ca but last rx was Jan 2012 well before asthma worse.  To er multiple times >  no better with singular added x 3 months. rec Stop Lisinopril Dulera 200 Take 2 puffs first thing in am and then another 2 puffs about 12 hours later.  Work on inhaler technique  Nexium 40 mg   Take 30-60 min before first meal of the day and Pepcid 20 mg one bedtime until return Only use your albuterol as a rescue medication to be used if you can't catch your breath by resting or doing a relaxed purse lip breathing pattern. The less you use it, the better it will work when you need it.  Prednisone 10 mg take  4 each am x 2 days,   2 each am x 2 days,  1 each am x2days and stop    01/03/2011 f/u ov/Erica [name] cc much better off ace and advair but still needing saba twice daily on avg. No  Purulent sputum. rec no change in rx/ rule of 2's reviewed       09/11/2012 f/u ov/Erica [name] "did fine" p prev ov then resumed advair 250  And now on normodyne Chief Complaint  Patient presents with  . Acute Visit    Pt c/o increased cough and SOBfor the past 2 wks. Cough is occ prod with minimal clear sputum. Breathing is esp worse at night, waking up 2-3 times at night to use neb.   >>Prednisone 10 mg take  4 each am x 2 days,   2 each am x 2 days,  1 each am x 2 days and stop  Stop advair and normodyne bystolic 20 mg every 12 hours (2 x 10 every 12 hours)  dulera 100 Take 2 puffs first thing in am  and then another 2 puffs about 12 hours later   09/25/2012 Med calendar  Returns for 1 week follow up for med review  We reviewed all her meds .Data base down for med calendars.  She depends on samples , rx to [place] assistance program and generics -as she does not have insurance.  We discussed drug assistance program for [facility].  Will need to change bystolic to bisoprolol for cost issues.  Last visit labs revealed neg d dimer and bnp. CXR w/out acute process.  She feels about the same still gets DOE . Occasional cough and wheezing on/off.  No fever or discolored mucus.  Lives part-time in [facility].  rec Stop Ipratropium Neb.  Continue on current regimen .  We will change Bystolic to Bisoprolol 10mg  Twice daily   We will get paperwork for Drug assistance program for [facility]    01/12/2013 f/u ov/Erica [name] re: dtc asthma Chief Complaint  Patient presents with  . Follow-up    PFT done today.   Even on prednisone still need albuterol hfa avg once at night and  neb saba also w/in 2 h of saba hfa for noct cough/ wheeze but much less sob/wheeze during the day eg with walk then worse in 2 weeks despite dulera 100 2bid.  rec Increase dulera to 200 Take 2 puffs first thing in am and then another 2 puffs about 12 hours later.  I will contact Dr Hilarie Fredrickson regarding your night time spells that don't resolve  even on the prednisone Prednisone 10 mg take  4 each am x 2 days,   2 each am x 2 days,  1 each am x 2 days and stop Please schedule a follow up office visit in 4 weeks, sooner if needed with all meds and inhalers in hand  Be sure using mucinex dm and tramadol correctly      04/06/2013 Follow up and Med Review   Returns for follow up and med review  We reviewed all her meds and organized them into a med calendar. She does have Medicaid but depends on samples. Did not bring all her meds with her today , says they are at home.  Says her breathing is the same since last ov w/ SOB, Has on /off wheezing, some  chest heaviness, prod cough with yellow mucus. Good and bad days. Wears out easily .  Denies f/c/s, nausea, vomiting.  Did not stop singulair as recommended.  rec  Follow med calendar closely and bring to each visit > did not do  We are referring you for a CPSTwith spirometry before and after> never done    07/28/2013 Follow up and Med Review  Patient returns for a followup and medication review. We reviewed all her medications organized them into a medication calendar with patient education. She has many med changes and is confused with several meds. We reviewed all the recent changes and updated her list w/ education.Called her pharmacy to verify/update.  Patient reports that she was recently seen in emergency room on April 27 due to chest pain, dizziness and asthma flare. She was given a Depo-Medrol shot, meclizine. And prednisone taper.   She says that she is feeling better. CP and dizziness resolved.  CXR w/ nad , CT head was neg . She denies any hemoptysis, chest pain, orthopnea, PND, or leg swelling. >>taper off pred.   01/26/2014 Acute OV (Asthma and Cough)  Complains of prod cough with yellow mucus (worse at night), wheezing, increased SOB, some tightness worse for last couple of weeks.  Cough has never went away.  Reports symptoms have not improved since last ov.   Denies f/c/s, n/v/d, hemoptysis, chest pain, orthopnea or edema.  Remains on Dulera Twice daily  .  Using tramadol and mucinex dm for cough with some help.  She is followed by Neurology for neuropathy d/t chemo, peeudotumor cerbri,  Hx of Breast cancer s/p lumpectomy/radiation/chemo.  rec Doxycyline 100mg  Twice daily  For 7 days  Mucinex DM Twice daily  As needed  Cough Prednisone taper over next week    04/04/2014 f/u ov/Erica [name] re: chronic asthma/ cough  Chief Complaint  Patient presents with  . Follow-up    Pt states that her cough is unchanged. She denies any new co's today.   75% better typically after  prednisone and then worse off same pattern x one year Cough worse when lie down > yellowish thick most productive in am  Worse chest  burning off ppi but no change on breathing or coughing with overt HB Not needing saba for breathing, uses neb "helps cough" once  a day avg  >>change to Dulera 100, CXR   04/18/2014 Follow up and Med Review / NP  Patient returns for a two-week follow-up and medication review. We reviewed all her medications organize them into a medication count with patient education It appears that she is taking her medications correctly, however, she has over 30 medications and has had several changes recently. Last visit, her [name] was decreased to the 166mcg says that her breathing was better on the 256mcg Patient had a chest x-ray last visit that showed no acute changes She underwent a CT sinus due to persistent cough , which was positive for sinusitis on the right She was treated with a ten-day course of Avelox, she has one dose left Does feel some better but still has cough . She denies any hemoptysis, orthopnea, PND or leg swelling  Patient has upcoming sleep study. Next week for suspected underlying sleep apnea with snoring and daytime sleepiness rec Refer to ENT for sinusitis and chronic cough > never saw one Increase Dulera 200 2 puffs Twice daily   Follow med calendar closely and bring to each visit.    07/18/2014 f/u ov/Erica [name] re: chronic cough / did not bring med calendar  Chief Complaint  Patient presents with  . Follow-up    Cough is unchanged. No new co's today. She is using albuterol inhaler 2 x daily on average and she is using albuterol neb early am.   am cough with lots of white mucus  Never saw ent  Wakes up daily at 4 am aith cough but no purulent mucus x one year, some better on abx and prednisone, uses saba hfa usually at that point, doesn't work so uses neb then too  No obvious day to day or daytime variabilty or assoc chronic sob unless coughing  or cp or chest tightness, subjective wheeze overt sinus or hb symptoms. No unusual exp hx or h/o childhood pna/ asthma or knowledge of premature birth.  Sleeping ok without nocturnal  or early am exacerbation  of respiratory  c/o's or need for noct saba. Also denies any obvious fluctuation of symptoms with weather or environmental changes or other aggravating or alleviating factors except as outlined above   Current Medications, Allergies, Complete Past Medical History, Past Surgical History, Family History, and Social History were reviewed in [employer] record.  ROS  The following are not active complaints unless bolded sore throat, dysphagia, dental problems, itching, sneezing,  nasal congestion or excess/ purulent secretions, ear ache,   fever, chills, sweats, unintended wt loss, pleuritic or exertional cp, hemoptysis,  orthopnea pnd or leg swelling, presyncope, palpitations, heartburn, abdominal pain, anorexia, nausea, vomiting, diarrhea  or change in bowel or urinary habits, change in stools or urine, dysuria,hematuria,  rash, arthralgias, visual complaints, headache, numbness weakness or ataxia or problems with walking or coordination,  change in mood/affect or memory.                  Objective:   Physical Exam  Obese bf nad    Wt 213  12/18/10 > 01/03/2011  214 > 01/31/2011  206 >  09/11/2012 209 >208 6/19 > 210 09/25/2012 > 11/20/2012 211 >208 12/07/12> 01/12/2013 209 > 02/09/2013 210 >207 04/06/2013 > 07/14/2013  213>212 07/28/2013 >204 01/26/2014> 04/04/2014 206  >205 04/18/2014 >  07/18/2014 201   HEENT: nl dentition, turbinates, and orophanx. Nl external ear canals without cough reflex, NM swollen    NECK :  without JVD/Nodes/TM/ nl carotid  upstrokes bilaterally   LUNGS: lungs clear bilaterally with good air movement/ mild pseudowheeze    CV:  RRR  no s3 or murmur or increase in P2, no edema   ABD:  soft and nontender with nl excursion in the supine position. No  bruits or organomegaly, bowel sounds nl  MS:  warm without deformities, calf tenderness, cyanosis or clubbing      04/08/14 CT sinus >Air-fluid level in the right sphenoid sinusAir-fluid level in the right sphenoid sinus

## 2014-07-18 NOTE — Patient Instructions (Addendum)
Please see patient coordinator before you leave today  to schedule sinus CT   Please schedule a follow up office visit in 2 weeks, sooner if needed with med calendar in hand and all meds

## 2014-07-19 ENCOUNTER — Encounter: Payer: Self-pay | Admitting: Internal Medicine

## 2014-07-19 ENCOUNTER — Encounter: Payer: Medicaid - Out of State | Admitting: Cardiology

## 2014-07-19 NOTE — Assessment & Plan Note (Addendum)
-   Allergy profile 04/04/2014 >  Eos 2.1%,  IgE  75 pos RAST ragweed/ grass  - sinus CT 04/08/2014 Air-fluid level right sphenoid sinus. Remainder of the sinuses are Clear> rec avelox 400 mg x 10 days  -refer to ENT 04/18/2014 > never went  so rec repeat sinus ct 07/19/2014 >>>

## 2014-07-19 NOTE — Assessment & Plan Note (Signed)
-   PFT's 01/31/2011 FEV1  1.76 (60% ) ratio 68 and no better p B2,  DLCO 70% -med review 09/25/2012 > Dulera drug assistance  paperwork given    - HFA 90% 11/20/2012 .  -Med calendar 12/07/12 , 04/06/2013 , 04/18/2014 - PFT's 01/12/2013  1.73 (65%)  Ratio 73 and no better p B2, DLCO 86% - trial off singulair 02/08/13 >>>did not stop  - 04/05/2014  trial of dulera 100 instead of 200 with full sample given and f/u 2 weeks plan to check if taking it and whether helped/ hurt -04/18/2014 increase Dulera 200   Her symptoms remain difficult to control with the most bothersome the 4 am cough/wheeze x one year.  I had an extended discussion with the patient reviewing all relevant studies completed to date and  lasting 15 to 20 minutes of a 25 minute visit on the following ongoing concerns:  1) I told her frankly it was not acceptable to me as her pulmonary specialist to just learn for the first time today of the 4 am symptoms x one year  2) this suggests sinusitis / noct gerd / ? Element of anxiety/ habitual over use of saba in that order so rec start with sinus ct/ continue gerd rx, then return with all meds in hand for a trust but verify visit.  3) Each maintenance medication was reviewed in detail including most importantly the difference between maintenance and as needed and under what circumstances the prns are to be used.  Please see instructions for details which were reviewed in writing and the patient given a copy.

## 2014-07-19 NOTE — Progress Notes (Signed)
This encounter was created in error - please disregard.

## 2014-07-20 ENCOUNTER — Encounter: Payer: Self-pay | Admitting: Cardiology

## 2014-07-25 ENCOUNTER — Telehealth: Payer: Self-pay | Admitting: Neurology

## 2014-07-25 DIAGNOSIS — G932 Benign intracranial hypertension: Secondary | ICD-10-CM | POA: Diagnosis not present

## 2014-07-25 DIAGNOSIS — H5340 Unspecified visual field defects: Secondary | ICD-10-CM | POA: Diagnosis not present

## 2014-07-25 NOTE — Telephone Encounter (Signed)
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 I called the patient for more information and she reports noticing differences in her vision for the past 2 weeks. There has been no sudden change in her vision. We will proceed with imaging of the brain STAT CT head, CTA head and neck.  Pending results will obtain MRI.  Sayge Salvato K. Posey Pronto, DO

## 2014-07-26 ENCOUNTER — Other Ambulatory Visit: Payer: Self-pay | Admitting: *Deleted

## 2014-07-26 ENCOUNTER — Ambulatory Visit (HOSPITAL_COMMUNITY)
Admission: RE | Admit: 2014-07-26 | Discharge: 2014-07-26 | Disposition: A | Discharge status: Home / Self Care | Payer: Medicare Other | Source: Ambulatory Visit | Attending: Neurology | Admitting: Neurology

## 2014-07-26 ENCOUNTER — Encounter (HOSPITAL_COMMUNITY): Payer: Self-pay

## 2014-07-26 ENCOUNTER — Ambulatory Visit (INDEPENDENT_AMBULATORY_CARE_PROVIDER_SITE_OTHER)
Admission: RE | Admit: 2014-07-26 | Discharge: 2014-07-26 | Disposition: A | Discharge status: Home / Self Care | Payer: Medicare Other | Source: Ambulatory Visit | Attending: Internal Medicine | Admitting: Internal Medicine

## 2014-07-26 DIAGNOSIS — H539 Unspecified visual disturbance: Secondary | ICD-10-CM

## 2014-07-26 DIAGNOSIS — R51 Headache: Secondary | ICD-10-CM | POA: Insufficient documentation

## 2014-07-26 DIAGNOSIS — R05 Cough: Secondary | ICD-10-CM | POA: Diagnosis not present

## 2014-07-26 DIAGNOSIS — R059 Cough, unspecified: Secondary | ICD-10-CM

## 2014-07-26 MED ORDER — IOHEXOL 350 MG/ML SOLN
100.0000 mL | Freq: Once | INTRAVENOUS | Status: AC | PRN
  Administered 2014-07-26: 100 mL via INTRAVENOUS

## 2014-07-26 NOTE — Telephone Encounter (Signed)
I have put in orders.  LM for patient to call me back so that I can schedule appointments.

## 2014-07-26 NOTE — Telephone Encounter (Signed)
Patient is scheduled for today at 1:15.  Patient notified of appointment time and location.

## 2014-07-27 ENCOUNTER — Other Ambulatory Visit: Payer: Self-pay | Admitting: *Deleted

## 2014-07-27 ENCOUNTER — Telehealth: Payer: Self-pay | Admitting: Neurology

## 2014-07-27 DIAGNOSIS — C50412 Malignant neoplasm of upper-outer quadrant of left female breast: Secondary | ICD-10-CM

## 2014-07-27 DIAGNOSIS — H5340 Unspecified visual field defects: Secondary | ICD-10-CM

## 2014-07-27 NOTE — Telephone Encounter (Signed)
Called patient and informed her of CT/A brain results which does not disclose any abnormalities. I would like to move forward with MRI brain and large volume spinal tap, which she is in agreement of.  In the meantime, I will increase her Diamox to 750 mg 3 times daily.  Rayhan Groleau K. Posey Pronto, DO

## 2014-07-27 NOTE — Telephone Encounter (Signed)
Order complete as requested.

## 2014-07-28 ENCOUNTER — Encounter: Payer: Self-pay | Admitting: Nurse Practitioner

## 2014-07-28 ENCOUNTER — Other Ambulatory Visit (HOSPITAL_BASED_OUTPATIENT_CLINIC_OR_DEPARTMENT_OTHER): Payer: Medicare Other

## 2014-07-28 ENCOUNTER — Telehealth: Payer: Self-pay | Admitting: *Deleted

## 2014-07-28 ENCOUNTER — Ambulatory Visit (HOSPITAL_BASED_OUTPATIENT_CLINIC_OR_DEPARTMENT_OTHER): Payer: Medicare Other | Admitting: Nurse Practitioner

## 2014-07-28 ENCOUNTER — Ambulatory Visit (HOSPITAL_BASED_OUTPATIENT_CLINIC_OR_DEPARTMENT_OTHER): Payer: Medicare Other

## 2014-07-28 VITALS — BP 188/92 | Temp 98.3°F | Resp 19 | Ht 67.0 in | Wt 203.2 lb

## 2014-07-28 DIAGNOSIS — N938 Other specified abnormal uterine and vaginal bleeding: Secondary | ICD-10-CM

## 2014-07-28 DIAGNOSIS — C50412 Malignant neoplasm of upper-outer quadrant of left female breast: Secondary | ICD-10-CM

## 2014-07-28 DIAGNOSIS — Z5111 Encounter for antineoplastic chemotherapy: Secondary | ICD-10-CM

## 2014-07-28 LAB — CBC WITH DIFFERENTIAL/PLATELET
BASO%: 0.3 % (ref 0.0–2.0)
Basophils Absolute: 0 10*3/uL (ref 0.0–0.1)
EOS%: 0.7 % (ref 0.0–7.0)
Eosinophils Absolute: 0 10*3/uL (ref 0.0–0.5)
HCT: 41.1 % (ref 34.8–46.6)
HGB: 13.3 g/dL (ref 11.6–15.9)
LYMPH%: 24.1 % (ref 14.0–49.7)
MCH: 28.1 pg (ref 25.1–34.0)
MCHC: 32.3 g/dL (ref 31.5–36.0)
MCV: 86.9 fL (ref 79.5–101.0)
MONO#: 0.4 10*3/uL (ref 0.1–0.9)
MONO%: 7 % (ref 0.0–14.0)
NEUT#: 3.9 10*3/uL (ref 1.5–6.5)
NEUT%: 67.9 % (ref 38.4–76.8)
Platelets: 199 10*3/uL (ref 145–400)
RBC: 4.73 10*6/uL (ref 3.70–5.45)
RDW: 14.4 % (ref 11.2–14.5)
WBC: 5.7 10*3/uL (ref 3.9–10.3)
lymph#: 1.4 10*3/uL (ref 0.9–3.3)

## 2014-07-28 LAB — COMPREHENSIVE METABOLIC PANEL (CC13)
ALT: 23 U/L (ref 0–55)
ANION GAP: 14 meq/L — AB (ref 3–11)
AST: 18 U/L (ref 5–34)
Albumin: 3.7 g/dL (ref 3.5–5.0)
Alkaline Phosphatase: 62 U/L (ref 40–150)
BILIRUBIN TOTAL: 0.51 mg/dL (ref 0.20–1.20)
BUN: 8.4 mg/dL (ref 7.0–26.0)
CHLORIDE: 104 meq/L (ref 98–109)
CO2: 25 mEq/L (ref 22–29)
Calcium: 9.3 mg/dL (ref 8.4–10.4)
Creatinine: 0.9 mg/dL (ref 0.6–1.1)
EGFR: 90 mL/min/{1.73_m2} — ABNORMAL LOW (ref >90)
GLUCOSE: 152 mg/dL — AB (ref 70–140)
Potassium: 3.2 mEq/L — ABNORMAL LOW (ref 3.5–5.1)
SODIUM: 143 meq/L (ref 136–145)
Total Protein: 7.3 g/dL (ref 6.4–8.3)

## 2014-07-28 MED ORDER — GOSERELIN ACETATE 3.6 MG ~~LOC~~ IMPL
3.6000 mg | DRUG_IMPLANT | Freq: Once | SUBCUTANEOUS | Status: AC
  Administered 2014-07-28: 3.6 mg via SUBCUTANEOUS
  Filled 2014-07-28: qty 3.6

## 2014-07-28 NOTE — Telephone Encounter (Signed)
Erica [name] from [place] [facility] called to let me know that she got the order and will call patient to set up the appointment.

## 2014-07-28 NOTE — Progress Notes (Signed)
ID: Erica [name]   DOB: 01-16-1976  MR#: [id number]  TDD#:[id number]  WCB:JSEGBT [name], MD GYN: SU:  Erroll Luna, MD Other:  Christinia Gully, MD;  Zenovia Jarred, MD;  Ena Dawley, MD  CHIEF COMPLAINT:  Triple negative Breast Cancer  CURRENT TREATMENT: Observation  BREAST CANCER HISTORY: From the original intake note:  Amerah noticed a lump in her left breast sometime in August or September 2009.  Initially this was thought to possibly be "dry breast milk" but as the mass got bigger, she brought it to her physician's attention and was set up for mammography.  This was performed May 20, 2008 at [facility] and it showed a palpable 8.5 cm oval circumscribed mass in the left breast upper outer quadrant.  There were prominent left axillary lymph nodes noted as well.  An ultrasound showed this to be hypoechoic, with lobulated margins and probable central necrosis measuring up to 7.3 cm on this modality.  Ultrasound of the left axilla demonstrated numerous prominent lymph nodes at the upper limit of normal, the largest measuring 1 cm with a normal appearing thin cortex and a normal cortex to halo ratio.    Because the patient had no insurance, she was referred to [facility] and biopsy was performed in [place] with the pathology there ([id number]) showing a poorly differentiated malignancy which was triple negative (ER 1%, PR 0%, HER-2/neu 0).  The axillary lymph nodes were not biopsied. With this information, the patient was presented at the Breast Multidisciplinary Conference and the feeling was that, after appropriate staging, the patient would be a good candidate for neoadjuvant treatment and possibly NSABP B40.    Her subsequent history is as detailed below  INTERVAL HISTORY: Erica [name] returns today for follow-up of her breast cancer. She continues on goserelin monthly but still has a monthly cycle so far. She does not complain of any increased hot flashes, but her flow has not decreased  either. She experiences injection site pain. Her blood pressure is high during our visit, but she plans to head straight home to take an extra clonidine, as advised her her PCP when her blood pressure is elevated like this.  REVIEW OF SYSTEMS: Erica [name] continues to work with Dr. Melvyn Novas regarding her pulmonary issues. She saw his just last week. She continues on inhalers and he is trying to wean her down from using her nebulizer so much. She has slept better for the past 2 months now that she has a CPAP. She is going for a lumbar puncture to relieve the pressure behind her optic nerve. 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 She has a new left temporal visual defect. Her CT was negative for stroke. She will have a repeat brain MRI before following back up with her neurologist. She continues on lyrica for her residual neuropathy symptoms. She complains of bilateral finger pain x 2 weeks when they are pressed or bent. She experiences chronic pain all over. She endorses anxiety and depression. She denies fevers, chills, nausea, vomiting, or changes in bowel or bladder habit. Her appetite is poor and she has some nausea while eating on occasion. A detailed review of systems is otherwise stable.   PAST MEDICAL HISTORY: Past Medical History  Diagnosis Date  . Asthma   . Hypertension   . GERD (gastroesophageal reflux disease)   . Impaired glucose tolerance 04/13/2011  . Anemia, unspecified 04/13/2011  . Asthma 09/27/2010  . Bloating 03/18/2011  . 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 Lymphadenitis, chronic     restricted LEFT extremity  . Dyspnea on exertion     a. 01/2013 Lexi MV: EF 54%, no ischemia/infarct;  b. 05/2013 Echo: EF 60-65%, no rwma, Gr 2 DD;  b.   . OSA (obstructive sleep apnea) 05/04/2014    AHI 31/hr now on CPAP at 12cm H2O  . Breast cancer 2010    a. locally advanced left breast carcinoma diagnosed in 2010 and treated with neoadjuvant chemotherapy with  docetaxel and cyclophosphamide as well as paclitaxel. This was followed by radiation therapy which was completed in 2011.    PAST SURGICAL HISTORY: Past Surgical History  Procedure Laterality Date  . Hand surgery  2012    right; tendon release  . Breast lumpectomy  09/2008    left  . Carpal tunnel release Left 2011  . Tubal ligation  05/11/2011    Procedure: POST PARTUM TUBAL LIGATION;  Surgeon: Emeterio Reeve, MD;  Location: [facility];  Service: Gynecology;  Laterality: Bilateral;  Induced for HTN  . Lymph node dissection  2010    left breast; 2 wks after breast lumpectomy    FAMILY HISTORY Family History  Problem Relation Age of Onset  . Diabetes Maternal Grandmother   . Heart disease Maternal Grandmother   . Hypertension Maternal Grandmother   . Cancer Neg Hx   . Alcohol abuse Neg Hx   . Early death Neg Hx   . Hyperlipidemia Neg Hx   . Kidney disease Neg Hx   . Stroke Neg Hx   . Colon cancer Neg Hx   The patient's parents are alive. The patient has one brother and one sister.  There is no ovarian or breast cancer in the immediate family, but the patient's maternal grandmother, who was one of 1 sisters, had one sister with breast cancer diagnosed in her 48.    GYNECOLOGIC HISTORY: updated January 2015 The patient is GX P4, first pregnancy to term at age 5.  She stopped having periods at the time of chemotherapy, and had had no periods for  2 years, then became pregnant in 2012. This was a surprise to her, and she was not aware of the pregnancy until the seventh month. She has not resumed normal menses, but has had some "spotting" on and off for the last several months.  SOCIAL HISTORY: updated  January 2015  She has been a Dance movement psychotherapist in the past. She's currently disabled. she splits her time between [place] and [place], [place]. She's currently single. Erica [name] has four children,  currently 70 year old, 3 years old, 37 and 41 years old. Her oldest daughter is attending  college in [place].   Her youngest son is almost 2. 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 Smoking status: Never Smoker   . Smokeless tobacco: Never Used  . Alcohol Use: 0.0 oz/week     Comment: occasionally     Colonoscopy: Never  PAP: June 2013  Bone density:  Never  Lipid panel: Nov 2014, Dr. Meda Coffee   Allergies  Allergen Reactions  . Aspirin Shortness Of Breath and Palpitations    Pt can take ibuprofen without reaction  . Doxycycline Anaphylaxis  . Penicillins Shortness Of Breath and Palpitations  . Kiwi Extract Itching and Swelling    Lip swelling  . Strawberry Hives    Current Outpatient Prescriptions  Medication  Sig Dispense Refill  . acetaZOLAMIDE (DIAMOX) 250 MG tablet Take 500 mg by mouth 3 (three) times daily. 2 by mouth three times daily    . albuterol (PROVENTIL HFA;VENTOLIN HFA) 108 (90 BASE) MCG/ACT inhaler Inhale 2 puffs into the lungs every 4 (four) hours as needed for wheezing or shortness of breath (((PLAN A))). 18 g 5  . albuterol (PROVENTIL) (2.5 MG/3ML) 0.083% nebulizer solution Take 3 mLs (2.5 mg total) by nebulization every 4 (four) hours as needed for wheezing or shortness of breath (((PLAN B))). 300 mL 5  . amLODipine-olmesartan (AZOR) 10-40 MG per tablet Take 1 tablet by mouth daily.    . Biotin 10 MG TABS Take 1 tablet by mouth daily.    . cloNIDine (CATAPRES) 0.3 MG tablet Take 1 tablet (0.3 mg total) by mouth 2 (two) times daily. (Patient taking differently: Take 0.3 mg by mouth 2 (two) times daily. Pt also takes extra tablet when BP is elevated.) 90 tablet 3  . dextromethorphan-guaiFENesin (MUCINEX DM) 30-600 MG per 12 hr tablet Take 1 tablet by mouth every 12 (twelve) hours as needed for cough.    . hyoscyamine (LEVSIN SL) 0.125 MG SL tablet Place 1 tablet (0.125 mg total) under the tongue every 8 (eight) hours as needed for cramping. 30 tablet 3  .  isosorbide mononitrate (IMDUR) 60 MG 24 hr tablet Take 1 tablet (60 mg total) by mouth daily. 30 tablet 11  . LORazepam (ATIVAN) 0.5 MG tablet Take 1 tablet (0.5 mg total) by mouth every 8 (eight) hours as needed for anxiety. 60 tablet 2  . metoCLOPramide (REGLAN) 10 MG tablet Take 1 tablet (10 mg total) by mouth 4 (four) times daily -  with meals and at bedtime. 120 tablet 3  . mometasone (NASONEX) 50 MCG/ACT nasal spray Place 2 sprays into the nose 2 (two) times daily.    . mometasone-formoterol (DULERA) 200-5 MCG/ACT AERO Inhale 2 puffs into the lungs 2 (two) times daily. 13 g 5  . montelukast (SINGULAIR) 10 MG tablet Take 1 tablet (10 mg total) by mouth at bedtime. 30 tablet 5  . nebivolol (BYSTOLIC) 10 MG tablet Take 1 tablet (10 mg total) by mouth daily. 90 tablet 3  . ondansetron (ZOFRAN-ODT) 4 MG disintegrating tablet Take 1 tablet (4 mg total) by mouth every 6 (six) hours as needed for nausea or vomiting. 20 tablet 5  . pantoprazole (PROTONIX) 40 MG tablet Take 1 tablet (40 mg total) by mouth 2 (two) times daily before a meal. 60 tablet 3  . potassium chloride SA (K-DUR,KLOR-CON) 20 MEQ tablet Take 20 mEq by mouth 3 (three) times daily.    . pregabalin (LYRICA) 200 MG capsule Take 1 capsule (200 mg total) by mouth 3 (three) times daily. 90 capsule 5  . Respiratory Therapy Supplies (FLUTTER) DEVI Use as directed. 1 each 0  . sucralfate (CARAFATE) 1 G tablet Take 1 tablet (1 g total) by mouth 4 (four) times daily. 30 tablet 0  . traMADol (ULTRAM) 50 MG tablet Take 50-100 mg by mouth every 6 (six) hours as needed. 1-2 every 4 hours as needed for cough or pain    . acetaminophen (TYLENOL) 500 MG tablet Take 1,000 mg by mouth every 6 (six) hours as needed for moderate pain.    Marland Kitchen EPINEPHrine 0.3 mg/0.3 mL IJ SOAJ injection Inject 0.3 mLs (0.3 mg total) into the muscle once. (Patient not taking: Reported on 07/28/2014) 2 Device 1  . furosemide (LASIX) 40 MG tablet Take  1 tablet (40 mg total) by  mouth daily as needed. (Patient not taking: Reported on 07/28/2014) 30 tablet 6  . nitroGLYCERIN (NITROSTAT) 0.4 MG SL tablet Place 1 tablet (0.4 mg total) under the tongue every 5 (five) minutes as needed for chest pain. (Patient not taking: Reported on 07/28/2014) 90 tablet 3   No current facility-administered medications for this visit.    OBJECTIVE:  Young African-American woman in no acute distress Filed Vitals:   07/28/14 1604  BP: 188/92  Temp:   Resp:      Body mass index is 31.82 kg/(m^2).    ECOG FS: 2 Filed Weights   07/28/14 1525  Weight: 203 lb 3.2 oz (92.171 kg)   Skin: warm, dry  HEENT: sclerae anicteric, conjunctivae pink, oropharynx clear. No thrush or mucositis.  Lymph Nodes: No cervical or supraclavicular lymphadenopathy  Lungs: clear to auscultation bilaterally, no rales, wheezes, or rhonci  Heart: regular rate and rhythm  Abdomen: round, soft, non tender, positive bowel sounds  Musculoskeletal: No focal spinal tenderness, no peripheral edema  Neuro: non focal, well oriented, positive affect  Breasts: left breast status post lumpectomy and radiation. No suspicious masses, or lumps palpated. Left axilla benign. Right breast unremarkable.      LAB RESULTS: Lab Results  Component Value Date   WBC 5.7 07/28/2014   NEUTROABS 3.9 07/28/2014   HGB 13.3 07/28/2014   HCT 41.1 07/28/2014   MCV 86.9 07/28/2014   PLT 199 07/28/2014      Chemistry      Component Value Date/Time   NA 142 06/18/2014 1404   NA 141 04/07/2014 1411   K 3.3* 06/18/2014 1404   K 3.3* 04/07/2014 1411   CL 107 06/18/2014 1404   CL 103 09/10/2012 1618   CO2 26 06/18/2014 1404   CO2 23 04/07/2014 1411   BUN 10 06/18/2014 1404   BUN 10.9 04/07/2014 1411   CREATININE 0.87 06/18/2014 1404   CREATININE 1.0 04/07/2014 1411   CREATININE 0.64 05/02/2011 1021      Component Value Date/Time   CALCIUM 9.1 06/18/2014 1404   CALCIUM 9.2 04/07/2014 1411   ALKPHOS 57 04/07/2014 1411   ALKPHOS  43 09/15/2013 0702   AST 16 04/07/2014 1411   AST 24 09/15/2013 0702   ALT 17 04/07/2014 1411   ALT 32 09/15/2013 0702   BILITOT 0.60 04/07/2014 1411   BILITOT <0.2* 09/15/2013 0702        STUDIES: Ct Angio Head W/cm &/or Wo Cm  07/26/2014   CLINICAL DATA:  Visual difficulty for 2 weeks. Generalized headache.  EXAM: CT ANGIOGRAPHY HEAD AND NECK  TECHNIQUE: Multidetector CT imaging of the head and neck was performed using the standard protocol during bolus administration of intravenous contrast. Multiplanar CT image reconstructions and MIPs were obtained to evaluate the vascular anatomy. Carotid stenosis measurements (when applicable) are obtained utilizing NASCET criteria, using the distal internal carotid diameter as the denominator.  CONTRAST:  145m OMNIPAQUE IOHEXOL 350 MG/ML SOLN  COMPARISON:  Noncontrast MR brain 09/17/2011.  CT head 07/27/2013.  FINDINGS: CT HEAD  Calvarium and skull base: No fracture or destructive lesion. Mastoids and middle ears are grossly clear.  Paranasal sinuses: Imaged portions are clear.  Orbits: Negative.  Brain: No evidence of acute abnormality, including acute infarct, hemorrhage, hydrocephalus, or mass lesion.  CTA NECK  Aortic arch: Standard branching. Imaged portion shows no evidence of aneurysm or dissection. No significant stenosis of the major arch vessel origins.  Right carotid system:  No evidence of dissection, stenosis (50% or greater) or occlusion.  Left carotid system: No evidence of dissection, stenosis (50% or greater) or occlusion.  Vertebral arteries: Codominant. No evidence of dissection, stenosis (50% or greater) or occlusion.  Soft tissues: Unremarkable in in  CTA HEAD  Anterior circulation: No significant stenosis, proximal occlusion, aneurysm, or vascular malformation.  Posterior circulation: No significant stenosis, proximal occlusion, aneurysm, or vascular malformation.  Venous sinuses: As permitted by contrast timing, patent.  Anatomic  variants: None of significance.  Delayed phase:   No abnormal intracranial enhancement.  IMPRESSION: Unremarkable CTA head and neck.   Electronically Signed   By: Rolla Flatten M.D.   On: 07/26/2014 15:21   Ct Angio Neck W/cm &/or Wo/cm  07/26/2014   CLINICAL DATA:  Visual difficulty for 2 weeks. Generalized headache.  EXAM: CT ANGIOGRAPHY HEAD AND NECK  TECHNIQUE: Multidetector CT imaging of the head and neck was performed using the standard protocol during bolus administration of intravenous contrast. Multiplanar CT image reconstructions and MIPs were obtained to evaluate the vascular anatomy. Carotid stenosis measurements (when applicable) are obtained utilizing NASCET criteria, using the distal internal carotid diameter as the denominator.  CONTRAST:  131m OMNIPAQUE IOHEXOL 350 MG/ML SOLN  COMPARISON:  Noncontrast MR brain 09/17/2011.  CT head 07/27/2013.  FINDINGS: CT HEAD  Calvarium and skull base: No fracture or destructive lesion. Mastoids and middle ears are grossly clear.  Paranasal sinuses: Imaged portions are clear.  Orbits: Negative.  Brain: No evidence of acute abnormality, including acute infarct, hemorrhage, hydrocephalus, or mass lesion.  CTA NECK  Aortic arch: Standard branching. Imaged portion shows no evidence of aneurysm or dissection. No significant stenosis of the major arch vessel origins.  Right carotid system: No evidence of dissection, stenosis (50% or greater) or occlusion.  Left carotid system: No evidence of dissection, stenosis (50% or greater) or occlusion.  Vertebral arteries: Codominant. No evidence of dissection, stenosis (50% or greater) or occlusion.  Soft tissues: Unremarkable in in  CTA HEAD  Anterior circulation: No significant stenosis, proximal occlusion, aneurysm, or vascular malformation.  Posterior circulation: No significant stenosis, proximal occlusion, aneurysm, or vascular malformation.  Venous sinuses: As permitted by contrast timing, patent.  Anatomic variants:  None of significance.  Delayed phase:   No abnormal intracranial enhancement.  IMPRESSION: Unremarkable CTA head and neck.   Electronically Signed   By: JRolla FlattenM.D.   On: 07/26/2014 15:21   [place]  07/26/2014   CLINICAL DATA:  Chronic, productive cough.  EXAM: CT PARANASAL SINUS LIMITED WITHOUT CONTRAST  TECHNIQUE: Non-contiguous multidetector CT images of the paranasal sinuses were obtained in a single plane without contrast.  COMPARISON:  Limited sinus CT scan 04/08/2014  FINDINGS: Air-fluid level in the right sphenoid sinus seen on the comparison examination has resolved. The paranasal sinuses appear clear. Limited visualization of intracranial contents is unremarkable.  IMPRESSION: Air-fluid level in the right sphenoid sinus seen on the comparison study has resolved. The paranasal sinuses appear clear.   Electronically Signed   By: TInge RiseM.D.   On: 07/26/2014 13:08     ASSESSMENT: 39y.o. [place] woman with history of locally advanced left breast carcinoma initially presenting in March 2010   (1) treated neoadjuvantly with 4 cycles of docetaxel/ cyclophosphamide.    (2) status post left lumpectomy and sentinel lymph node dissection in July 2010 for a ypT2 ypN1, stage IIB invasive ductal carcinoma, grade 3, triple negative, with MIB-1 of 88%.   (  3) status post full axillary dissection and margin clearance in August 2010.   (4) status post 11 doses of weekly paclitaxel completed in December 2010   (5) radiation therapy completed in March 2011.  Now followed with observation alone.   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 She had a mammogram in February that was completely benign. She is now 6 years out from her definitive surgery with no evidence of recurrent  disease. She will proceed with her next goserelin injection today, and I reassured her that she would continue to have her periods up front until her ovaries completely shut down. She told me she will "give it one more month".   I am not sure what the source of her bilateral hand/finger pain is. She is on lasix, but her hands do not look particularly swollen, and she denies any new activities where she was gripping anything for a long period of time. So far it has only been 2 weeks. She will take tylenol PRN, and continue to monitor this.   Nancee insists she has been seen every 3 months by this office, although that seems to only have been as of late because of the addition of goserelin. She will see Dr. [name] in 3 months as she requests, but as her chronic conditions are managed well through other physicians, I believe she could either "graduate" from follow up visits here or at least move on to yearly appointments.   Laurie Panda, NP    07/28/2014

## 2014-07-29 ENCOUNTER — Encounter: Payer: Self-pay | Admitting: Cardiology

## 2014-07-29 ENCOUNTER — Ambulatory Visit
Admission: RE | Admit: 2014-07-29 | Discharge: 2014-07-29 | Disposition: A | Discharge status: Home / Self Care | Payer: Medicare Other | Source: Ambulatory Visit | Attending: Neurology | Admitting: Neurology

## 2014-07-29 ENCOUNTER — Encounter: Payer: Self-pay | Admitting: *Deleted

## 2014-07-29 ENCOUNTER — Other Ambulatory Visit: Payer: Self-pay | Admitting: Internal Medicine

## 2014-07-29 DIAGNOSIS — H5340 Unspecified visual field defects: Secondary | ICD-10-CM

## 2014-07-29 DIAGNOSIS — G932 Benign intracranial hypertension: Secondary | ICD-10-CM | POA: Diagnosis not present

## 2014-07-29 DIAGNOSIS — R55 Syncope and collapse: Secondary | ICD-10-CM | POA: Diagnosis not present

## 2014-07-29 LAB — CSF CELL COUNT WITH DIFFERENTIAL
Appearance, CSF: 1
Color, CSF: 1
RBC Count, CSF: 0 cu mm
Tube #: 4
WBC, CSF: 2 cu mm (ref 0–5)

## 2014-07-29 LAB — PROTEIN, CSF: Total Protein, CSF: 27 mg/dL (ref 15–45)

## 2014-07-29 LAB — GLUCOSE, CSF: Glucose, CSF: 70 mg/dL (ref 43–76)

## 2014-07-29 NOTE — Discharge Instructions (Signed)

## 2014-07-29 NOTE — Addendum Note (Signed)
Addended by: Marcelino Duster on: 07/29/2014 09:27 AM   Modules accepted: Orders

## 2014-08-01 ENCOUNTER — Ambulatory Visit (INDEPENDENT_AMBULATORY_CARE_PROVIDER_SITE_OTHER): Payer: Medicare Other | Admitting: Internal Medicine

## 2014-08-01 ENCOUNTER — Other Ambulatory Visit (INDEPENDENT_AMBULATORY_CARE_PROVIDER_SITE_OTHER): Payer: Medicare Other

## 2014-08-01 ENCOUNTER — Encounter: Payer: Self-pay | Admitting: Internal Medicine

## 2014-08-01 VITALS — BP 160/84 | HR 107 | Temp 98.8°F | Ht 66.0 in | Wt 200.8 lb

## 2014-08-01 DIAGNOSIS — R059 Cough, unspecified: Secondary | ICD-10-CM

## 2014-08-01 DIAGNOSIS — R05 Cough: Secondary | ICD-10-CM | POA: Diagnosis not present

## 2014-08-01 DIAGNOSIS — R0600 Dyspnea, unspecified: Secondary | ICD-10-CM

## 2014-08-01 DIAGNOSIS — J4540 Moderate persistent asthma, uncomplicated: Secondary | ICD-10-CM

## 2014-08-01 LAB — CSF CULTURE
GRAM STAIN: NONE SEEN
ORGANISM ID, BACTERIA: NO GROWTH

## 2014-08-01 LAB — CSF CULTURE W GRAM STAIN: Gram Stain: NONE SEEN

## 2014-08-01 LAB — TSH: TSH: 1.7 u[IU]/mL (ref 0.35–4.50)

## 2014-08-01 LAB — BRAIN NATRIURETIC PEPTIDE: PRO B NATRI PEPTIDE: 16 pg/mL (ref 0.0–100.0)

## 2014-08-01 MED ORDER — MOMETASONE FURO-FORMOTEROL FUM 100-5 MCG/ACT IN AERO: INHALATION_SPRAY | RESPIRATORY_TRACT | Status: DC

## 2014-08-01 NOTE — Patient Instructions (Addendum)
Please remember to go to the lab  department downstairs for your tests - we will call you with the results when they are available.  Change dulera 200 to 100 Take 2 puffs first thing in am and then another 2 puffs about 12 hours later. (next refill)   For bad cough add tramadol to mucinex dm 1200 mg every 12 hours and use the flutter valve   Strongly recommend bed blocks for the head of your bed (mattress stores have them)  See calendar for specific medication instructions and bring it back for each and every office visit for every healthcare provider you see.  Without it,  you may not receive the best quality medical care that we feel you deserve.  You will note that the calendar groups together  your maintenance  medications that are timed at particular times of the day.  Think of this as your checklist for what your doctor has instructed you to do until your next evaluation to see what benefit  there is  to staying on a consistent group of medications intended to keep you well.  The other group at the bottom is entirely up to you to use as you see fit  for specific symptoms that may arise between visits that require you to treat them on an as needed basis.  Think of this as your action plan or "what if" list.   Separating the top medications from the bottom group is fundamental to providing you adequate care going forward.    See Tammy NP w/in 4 weeks with all your medications, even over the counter meds, separated in two separate bags, the ones you take no matter what vs the ones you stop once you feel better and take only as needed when you feel you need them.   Tammy  will generate for you a new user friendly medication calendar that will put [place] all on the same page re: your medication use.     Without this process, it simply isn't possible to assure that we are providing  your outpatient care  with  the attention to detail we feel you deserve.   If we cannot assure that you're getting that  kind of care,  then we cannot manage your problem effectively from this clinic.  Once you have seen Tammy and we are sure that we're all on the same page with your medication use she will arrange follow up with me.  Late add : if still having noct choking rec chlortrimeton 4 mg x 2 at hs

## 2014-08-01 NOTE — Progress Notes (Signed)
Quick Note:  ATC, phone breaking up x 2 WCB ______

## 2014-08-01 NOTE — Progress Notes (Signed)
Subjective:    Patient ID: Erica [name], female    DOB: [birth year]   MRN: [id number]    Brief patient profile:  13 yobf with asthma all her life much worse since around 07/2010 referred by Dr [name] to pulmonary clinic in Sept 2012 Hx of Breast Cancer 05/2008    History of Present Illness  12/18/2010 Initial pulmonary office eval on acei and  advair cc dtc asthma x 4 months but baseline = excess saba use "even when better" at least twice daily despite daily advair, worse at hs in fact  used saba 3 x last night.  On chemo /RT for breast ca but last rx was Jan 2012 well before asthma worse.  To er multiple times >  no better with singular added x 3 months. rec Stop Lisinopril Dulera 200 Take 2 puffs first thing in am and then another 2 puffs about 12 hours later.  Work on inhaler technique  Nexium 40 mg   Take 30-60 min before first meal of the day and Pepcid 20 mg one bedtime until return Only use your albuterol as a rescue medication to be used if you can't catch your breath by resting or doing a relaxed purse lip breathing pattern. The less you use it, the better it will work when you need it.  Prednisone 10 mg take  4 each am x 2 days,   2 each am x 2 days,  1 each am x2days and stop    01/03/2011 f/u ov/Gwyn Hieronymus cc much better off ace and advair but still needing saba twice daily on avg. No  Purulent sputum. rec no change in rx/ rule of 2's reviewed     04/06/2013 NP Follow up and Med Review   We reviewed all her meds and organized them into a med calendar. She does have Medicaid but depends on samples. Did not bring all her meds with her today , says they are at home.  Says her breathing is the same since last ov w/ SOB, Has on /off wheezing, some chest heaviness, prod cough with yellow mucus. Good and bad days. Wears out easily .  Denies f/c/s, nausea, vomiting.  Did not stop singulair as recommended.  rec  Follow med calendar closely and bring to each visit > did not do  We are  referring you for a CPSTwith spirometry before and after> never done    04/04/2014 f/u ov/Shelie Lansing re: chronic asthma/ cough/ inconsistent with f/u / use of med calendar  Chief Complaint  Patient presents with  . Follow-up    Pt states that her cough is unchanged. She denies any new co's today.   75% better typically after prednisone and then worse off same pattern x one year Cough worse when lie down > yellowish thick most productive in am  Worse chest  burning off ppi but no change on breathing or coughing with overt HB Not needing saba for breathing, uses neb "helps cough" once a day avg  >>change to Dulera 100, CXR   04/18/2014 Follow up and Med Review / NP  Patient returns for a two-week follow-up and medication review. We reviewed all her medications organize them into a medication count with patient education It appears that she is taking her medications correctly, however, she has over 30 medications and has had several changes recently. Last visit, her [name] was decreased to the 160mcg says that her breathing was better on the 283mcg Patient had a chest x-ray last visit that  showed no acute changes She underwent a CT sinus due to persistent cough , which was positive for sinusitis on the right She was treated with a ten-day course of Avelox, she has one dose left Does feel some better but still has cough . She denies any hemoptysis, orthopnea, PND or leg swelling  Patient has upcoming sleep study. Next week for suspected underlying sleep apnea with snoring and daytime sleepiness rec Refer to ENT for sinusitis and chronic cough > never saw one> f/u sinus CT neg 07/26/14  Increase Dulera 200 2 puffs Twice daily   Follow med calendar closely and bring to each visit.     08/01/2014 f/u ov/Antwyne Pingree re:  Asthma plus noct "gasping"  Chief Complaint  Patient presents with  . Follow-up    doing well.  no complaints.  not doing well x one year with noct / early  4am with cough/ gasping for  air almost always goes to neb and then can't go back to sleep even 30 min later better Daytime in chest tightness resolves  completely while on prednisone last took one month prior to OV  And doesn't have it now but has exact same noct symptoms and overusing saba   Doe x mailbox and back to house out of breath and coughing    No obvious day to day or daytime variabilty or assoc  cp or chest tightness, subjective wheeze overt sinus or hb symptoms. No unusual exp hx or h/o childhood pna/ asthma or knowledge of premature birth.  Sleeping ok without nocturnal  or early am exacerbation  of respiratory  c/o's or need for noct saba. Also denies any obvious fluctuation of symptoms with weather or environmental changes or other aggravating or alleviating factors except as outlined above   Current Medications, Allergies, Complete Past Medical History, Past Surgical History, Family History, and Social History were reviewed in [employer] record.  ROS  The following are not active complaints unless bolded sore throat, dysphagia, dental problems, itching, sneezing,  nasal congestion or excess/ purulent secretions, ear ache,   fever, chills, sweats, unintended wt loss, pleuritic or exertional cp, hemoptysis,  orthopnea pnd or leg swelling, presyncope, palpitations, heartburn, abdominal pain, anorexia, nausea, vomiting, diarrhea  or change in bowel or urinary habits, change in stools or urine, dysuria,hematuria,  rash, arthralgias, visual complaints, headache, numbness weakness or ataxia or problems with walking or coordination,  change in mood/affect or memory.                  Objective:   Physical Exam  Obese bf nad  ? Belle affect ?  Wt 213  12/18/10 > 01/03/2011  214 > 01/31/2011  206 >  09/11/2012 209 >208 6/19 > 210 09/25/2012 > 11/20/2012 211 >208 12/07/12> 01/12/2013 209 > 02/09/2013 210 >207 04/06/2013 > 07/14/2013  213>212 07/28/2013 >204 01/26/2014> 04/04/2014 206  >205 04/18/2014 >   07/18/2014 201 >  08/01/14 201   HEENT: nl dentition, turbinates, and orophanx. Nl external ear canals without cough reflex, NM swollen    NECK :  without JVD/Nodes/TM/ nl carotid upstrokes bilaterally   LUNGS: lungs clear bilaterally with good air movement/ mild pseudowheeze    CV:  RRR  no s3 or murmur or increase in P2, no edema   ABD:  soft and nontender with nl excursion in the supine position. No bruits or organomegaly, bowel sounds nl  MS:  warm without deformities, calf tenderness, cyanosis or clubbing     I  personally reviewed images and agree with radiology impression as follows:  CXR:  06/08/14  No active cardiopulmonary disease.     Labs ordered/ reviewed:       Lab 07/28/14 1507  NA 143  K 3.2*  CO2 25  BUN 8.4  CREATININE 0.9  GLUCOSE 152*       Lab 07/28/14 1507  HGB 13.3  HCT 41.1  WBC 5.7  PLT 199     Lab Results  Component Value Date   TSH 1.70 08/01/2014     Lab Results  Component Value Date   PROBNP 16.0 08/01/2014

## 2014-08-02 ENCOUNTER — Encounter: Payer: Self-pay | Admitting: Internal Medicine

## 2014-08-02 NOTE — Assessment & Plan Note (Signed)
Demonstrated in office this is cough not sob that stops her from walking > see uacs

## 2014-08-02 NOTE — Assessment & Plan Note (Addendum)
-   Allergy profile 04/04/2014 >  Eos 2.1%,  IgE  75 pos RAST ragweed/ grass  - sinus CT 04/08/2014 Air-fluid level right sphenoid sinus. Remainder of the sinuses are Clear> rec avelox 400 mg x 10 days  -refer to ENT 04/18/2014 > never went so rec repeat sinus ct 07/26/2014 >>>  Resolved sinusitis  - flutter addded 08/01/14   Her cough both noct and with ex is most c/w  Classic Upper airway cough syndrome, so named because it's frequently impossible to sort out how much is  CR/sinusitis with freq throat clearing (which can be related to primary GERD)   vs  causing  secondary (" extra esophageal")  GERD from wide swings in gastric pressure that occur with throat clearing, often  promoting self use of mint and menthol lozenges that reduce the lower esophageal sphincter tone and exacerbate the problem further in a cyclical fashion.   These are the same pts (now being labeled as having "irritable larynx syndrome" by some cough centers) who not infrequently have a history of having failed to tolerate ace inhibitors,  dry powder inhalers or biphosphonates or report having atypical reflux symptoms that don't respond to standard doses of PPI , and are easily confused as having aecopd or asthma flares by even experienced allergists/ pulmonologists.   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   If not improving next step is 1st gen h1 trial esp hs dosing

## 2014-08-02 NOTE — Assessment & Plan Note (Addendum)
-   PFT's 01/31/2011 FEV1  1.76 (60% ) ratio 68 and no better p B2,  DLCO 70% -med review 09/25/2012 > Dulera drug assistance  paperwork given    - HFA 90% 11/20/2012 .  -Med calendar 12/07/12 , 04/06/2013 , 04/18/2014 - PFT's 01/12/2013  1.73 (65%)  Ratio 73 and no better p B2, DLCO 86% - trial off singulair 02/08/13 >>>did not stop  - 04/05/2014  trial of dulera 100 instead of 200 with full sample given and f/u 2 weeks plan to check if taking it and whether helped/ hurt -04/18/2014 increase Dulera 200   - 08/01/2014  Walked RA x 2laps @ 185 ft each stopped due to cough > sob/ no desat moderate pace  - spirometry 08/01/2014  FEV1  1.87 (70%) ratio 78  fef 25-75 53%    - 08/01/2014 p extensive coaching HFA effectiveness =    90%  I had an extended discussion with the patient reviewing all relevant studies completed to date and  lasting 15 to 20 minutes of a 25 minute visit on the following ongoing concerns:   1) the asthma component of her problem appears to be well controlled s/p most recent course of prednisone while maint on dulera 200 but the cough is the main issue and may be aggravated by high dose ics so need to rechallenge with the dulera 100 and focus efforts on cough (discussed separately)  2) Each maintenance medication was reviewed in detail including most importantly the difference between maintenance and as needed and under what circumstances the prns are to be used. This was done in the context of a medication calendar review which provided the patient with a user-friendly unambiguous mechanism for medication administration and reconciliation and provides an action plan for all active problems. It is critical that this be shown to every doctor  for modification during the office visit if necessary so the patient can use it as a working document.

## 2014-08-08 ENCOUNTER — Inpatient Hospital Stay: Admission: RE | Admit: 2014-08-08 | Payer: Medicare Other | Source: Ambulatory Visit

## 2014-08-09 ENCOUNTER — Ambulatory Visit (INDEPENDENT_AMBULATORY_CARE_PROVIDER_SITE_OTHER): Payer: Medicare Other | Admitting: Cardiology

## 2014-08-09 ENCOUNTER — Encounter: Payer: Self-pay | Admitting: Cardiology

## 2014-08-09 VITALS — BP 138/92 | HR 100 | Ht 66.0 in | Wt 200.0 lb

## 2014-08-09 DIAGNOSIS — E876 Hypokalemia: Secondary | ICD-10-CM

## 2014-08-09 DIAGNOSIS — I5032 Chronic diastolic (congestive) heart failure: Secondary | ICD-10-CM

## 2014-08-09 DIAGNOSIS — I10 Essential (primary) hypertension: Secondary | ICD-10-CM

## 2014-08-09 DIAGNOSIS — R7309 Other abnormal glucose: Secondary | ICD-10-CM

## 2014-08-09 DIAGNOSIS — G4733 Obstructive sleep apnea (adult) (pediatric): Secondary | ICD-10-CM | POA: Diagnosis not present

## 2014-08-09 DIAGNOSIS — Z9989 Dependence on other enabling machines and devices: Secondary | ICD-10-CM

## 2014-08-09 DIAGNOSIS — I5033 Acute on chronic diastolic (congestive) heart failure: Secondary | ICD-10-CM

## 2014-08-09 DIAGNOSIS — G47419 Narcolepsy without cataplexy: Secondary | ICD-10-CM

## 2014-08-09 DIAGNOSIS — R0989 Other specified symptoms and signs involving the circulatory and respiratory systems: Secondary | ICD-10-CM

## 2014-08-09 NOTE — Progress Notes (Signed)
Patient ID: RAKISHA PINCOCK, female   DOB: [birth year], 39 y.o.   MRN: [id number]    Patient Name: Erica [name] Date of Encounter: 08/09/2014  Primary Care Provider:  Scarlette Calico, MD Primary Cardiologist:  Ena Dawley H  Patient Profile  Chest pain  Problem List   Past Medical History  Diagnosis Date  . Asthma   . Hypertension   . GERD (gastroesophageal reflux disease)   . Impaired glucose tolerance 04/13/2011  . Anemia, unspecified 04/13/2011  . Asthma 09/27/2010  . Bloating 03/18/2011  . 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 Lymphadenitis, chronic     restricted LEFT extremity  . Dyspnea on exertion     a. 01/2013 Lexi MV: EF 54%, no ischemia/infarct;  b. 05/2013 Echo: EF 60-65%, no rwma, Gr 2 DD;  b.   . OSA (obstructive sleep apnea) 05/04/2014    AHI 31/hr now on CPAP at 12cm H2O  . Breast cancer 2010    a. locally advanced left breast carcinoma diagnosed in 2010 and treated with neoadjuvant chemotherapy with docetaxel and cyclophosphamide as well as paclitaxel. This was followed by radiation therapy which was completed in 2011.  . Fatty liver   . Gastroparesis    Past Surgical History  Procedure Laterality Date  . Hand surgery  2012    right; tendon release  . Breast lumpectomy  09/2008    left  . Carpal tunnel release Left 2011  . Tubal ligation  05/11/2011    Procedure: POST PARTUM TUBAL LIGATION;  Surgeon: Emeterio Reeve, MD;  Location: [facility];  Service: Gynecology;  Laterality: Bilateral;  Induced for HTN  . Lymph node dissection  2010    left breast; 2 wks after breast lumpectomy    Allergies  Allergies  Allergen Reactions  . Aspirin Shortness Of Breath and Palpitations    Pt can take ibuprofen without reaction  . Doxycycline Anaphylaxis  . Penicillins Shortness Of Breath and Palpitations  . Kiwi Extract Itching and Swelling    Lip swelling  . Strawberry Hives   Chief complain: recurrent syncope  HPI  39 year old female with h/o asthma, obesity,  hypertension, who is coming with complaints of chest pain. This pleasant young female has a history of locally advanced left breast carcinoma diagnosed in 2010 and treated with neoadjuvant chemotherapy with docetaxel and cyclophosphamide as well as paclitaxel. This was followed by radiation therapy which was completed in 2011. The patient complains of chest pain it is not exertional, it is locally located in the retrosternal and left epigastric region. It has been happening anytime of the day, and can last for minutes. There is no clear aggravating or alleviating factor. The patient states that ever since her chemotherapy she gets short of breath with minimal activity and states that she will be able to walk a block.  Follow up after 4 month, no change in her symptoms, she still has exertional shortness of breath and fatigue. She also has a paroxysmal chest pain "heart squeezing" associated with SOB, alleviated by lorazepam and inhalors.   09/27/2013 - the patient is coming complaining of continuous daily wheezing, and nonexertional right upper quadrant pain that force her to go to the ER. A complete workup including abdominal ultrasound and CAT scan was performed. The only finding was fatty liver no acute pathology in the gallbladder no gallstones. The patient had an upper EGD last year where her gastroparesis was diagnosed and she was started on metoclopramide and proton next. Her  symptoms improved however she continues having episodes of this pain. She feels overall very tired and short of breath and complaints of orthopnea.  11/30/2013 - we stopped Bystolic at the last visit as it caused wheezing, her wheezing has improved but she feels more SOB. She is trying to exercise but gets SOB easily.   03/14/2014 -  The patient is undergoing cardiac rehabilitation and has experienced at least 2 episodes of syncope that are not related to exertion.  One of her episodes was while she was wearing Holter monitor and  strips only showed sinus rhythm. The patient is being evaluated for sleep apnea in January.She continues going to cardiac rehabilitation and enjoys it very well she overall is feels better.She brings her blood pressure log and her blood pressure  Are in the range 82-500 for systolic blood pressure. She also complains of lower extremity edema that happens on certain days.  05/03/2014 - the patient is coming with complaint of headache with Imdur. She still taking it. She wakes up every night with shortness of breath that resolves after she uses albuterol inhaler. She tries to exercise but bending makes her feel more short of breath. She underwent sleep study and is awaiting results.  08/09/14 - 3 months follow up, the patient underwent a sleep study that showed severe OSA with AHI of 31/hr and had successful CPAP titration to 12cm H2O.  The patient states that she can't tolerate CPAP machine "feels like I am suffocating", even after parameter adjustment.  She continues to have narcoleptic episodes with falls. No palpitations or syncope, no CP< stable DOE. No LE edema, orthopnea or PND.   Home Medications  Prior to Admission medications   Medication Sig Start Date End Date Taking? Authorizing Provider  albuterol (PROVENTIL HFA;VENTOLIN HFA) 108 (90 BASE) MCG/ACT inhaler Inhale 2 puffs into the lungs every 4 (four) hours as needed for wheezing or shortness of breath (((PLAN A))).     Historical Provider, MD  albuterol (PROVENTIL) (2.5 MG/3ML) 0.083% nebulizer solution Take 2.5 mg by nebulization every 4 (four) hours as needed for wheezing or shortness of breath (((PLAN B))). For shortness of breath. 05/07/12   Amy Milda Smart, PA-C  amLODipine-olmesartan (AZOR) 5-40 MG per tablet Take 1 tablet by mouth daily. 08/05/12   [name], MD  cloNIDine (CATAPRES) 0.2 MG tablet Take 1 tablet (0.2 mg total) by mouth 2 (two) times daily. 08/05/12   [name], MD  hyoscyamine (LEVSIN SL) 0.125 MG SL tablet Place 0.125  mg under the tongue every 8 (eight) hours as needed for cramping. 05/19/12   Jerene Bears, MD  LORazepam (ATIVAN) 0.5 MG tablet Take 1 tablet (0.5 mg total) by mouth at bedtime as needed for anxiety. 01/07/13   Chauncey Cruel, MD  metoCLOPramide (REGLAN) 10 MG tablet Take 1 tablet (10 mg total) by mouth 4 (four) times daily -  with meals and at bedtime. 01/14/13   Jerene Bears, MD  mometasone-formoterol (DULERA) 100-5 MCG/ACT AERO Inhale 2 puffs into the lungs 2 (two) times daily. 05/07/12   Amy Milda Smart, PA-C  montelukast (SINGULAIR) 10 MG tablet Take 1 tablet (10 mg total) by mouth at bedtime. 12/31/11   Chauncey Cruel, MD  nebivolol (BYSTOLIC) 10 MG tablet Take 10 mg by mouth at bedtime.    Historical Provider, MD  pantoprazole (PROTONIX) 40 MG tablet Take 1 tablet (40 mg total) by mouth daily. 01/07/13   Chauncey Cruel, MD  potassium chloride  SA (K-DUR,KLOR-CON) 20 MEQ tablet Take 20 mEq by mouth 2 (two) times daily. 06/04/12   [name], MD  sucralfate (CARAFATE) 1 G tablet Take 1 tablet (1 g total) by mouth 4 (four) times daily. 03/07/12   [name], MD  traMADol Veatrice Bourbon) 50 MG tablet 1-2 tabs by mouth every 4 hours as needed for cough or pain 09/10/12   Amy Milda Smart, PA-C  triamterene-hydrochlorothiazide (MAXZIDE-25) 37.5-25 MG per tablet Take 1 tablet by mouth every morning.    Historical Provider, MD    Family History  Family History  Problem Relation Age of Onset  . Diabetes Maternal Grandmother   . Heart disease Maternal Grandmother   . Hypertension Maternal Grandmother   . Cancer Neg Hx   . Alcohol abuse Neg Hx   . Early death Neg Hx   . Hyperlipidemia Neg Hx   . Kidney disease Neg Hx   . Stroke Neg Hx   . Colon cancer Neg Hx     Social History  History   Social History  . Marital Status: Single    Spouse Name: N/A  . Number of Children: 3  . Years of Education: N/A   Occupational History  .     Social History Main Topics  . Smoking status: Never Smoker    . Smokeless tobacco: Never Used  . Alcohol Use: 0.0 oz/week     Comment: occasionally  . Drug Use: No  . Sexual Activity: Yes    Birth Control/ Protection: Surgical   Other Topics Concern  . Not on file   Social History Narrative   She lives with four children.   She is currently not working (disbaility pending).           Review of Systems General:  No chills, fever, night sweats or weight changes.  Cardiovascular:  No chest pain, dyspnea on exertion, edema, orthopnea, palpitations, paroxysmal nocturnal dyspnea. Dermatological: No rash, lesions/masses Respiratory: No cough, dyspnea Urologic: No hematuria, dysuria Abdominal:   No nausea, vomiting, diarrhea, bright red blood per rectum, melena, or hematemesis Neurologic:  No visual changes, wkns, changes in mental status. All other systems reviewed and are otherwise negative except as noted above.  Physical Exam Blood pressure 138/92, pulse 100, height 5\' 6"  (1.676 m), weight 200 lb (90.719 kg).  General: Pleasant, NAD Psych: Normal affect. Neuro: Alert and oriented X 3. Moves all extremities spontaneously. HEENT: Normal  Neck: Supple without bruits or JVD. Lungs:  Resp regular and unlabored, CTA. Heart: RRR no s3, s4, or murmurs. Abdomen: Soft, non-tender, non-distended, BS + x 4.  Extremities: No clubbing, cyanosis or edema. DP/PT/Radials 2+ and equal bilaterally.  Lipid Panel     Component Value Date/Time   CHOL 172 02/03/2014 1144   TRIG 151.0* 02/03/2014 1144   HDL 32.00* 02/03/2014 1144   CHOLHDL 5 02/03/2014 1144   VLDL 30.2 02/03/2014 1144   LDLCALC 110* 02/03/2014 1144   Nuclear stress test: 02/15/2013 Impression  Exercise Capacity: Lexiscan with no exercise.  BP Response: Hypertensive blood pressure response.  Clinical Symptoms: Chest pain, headache.  ECG Impression: No significant ST segment change suggestive of ischemia.  Comparison with Prior Nuclear Study: No images to compare  Overall  Impression: Normal stress nuclear study.  LV Ejection Fraction: 54%. LV Wall Motion: NL LV Function; NL Wall Motion  Loralie Champagne  02/12/2013   TTE 06/17/2013  Left ventricle: The cavity size was normal. There was mild concentric hypertrophy. Systolic function was normal.  The estimated ejection fraction was in the range of 60% to 65%. Wall motion was normal; there were no regional wall motion abnormalities. Features are consistent with a pseudonormal left ventricular filling pattern, with concomitant abnormal relaxation and increased filling pressure (grade 2 diastolic dysfunction). Doppler parameters are consistent with high ventricular filling pressure. Impressions: - When compared to 2010, EF has improved.  Accessory Clinical Findings  ECG - normal sinus rhythm, 100 beats per minute, normal EKG.    Assessment & Plan  39 year old female with prior medical history of hyperlipidemia, hypertension and breast carcinoma with chemotherapy that can potentially cause heart block, heart failure and angina.  1.  Recurrent syncope -  E-cardio monitor showed only sinus rhythm. This is possibly due to narcolepsy, sleep study showed severe sleep apnea, she is non-complaint with CPAP machine.   2. Chronic diastolic CHF, associated with dyspnea on exertion - A lexiscan nuclear stress test showed poor exercise capacity, LV EF 54% and no perfusion defect.  Echocardiogram showed preserved LV function it is improved compared to prior echo however she has pseudo-normal pattern of diastolic dysfunction with elevated filling pressures.  Her blood pressure management has been number of challenging as her blood pressure is labile and can be very low as well.  She is euvolemic right now, we will continue the same regimen.  3. Hypokalemia - recheck  4. HTN - labile,  normal renal arterial hypertension, borderline today  5. OSA - severe, non-complaint with CPAP, encourage to use.  6. Elevated blood  glucose - we will chcek HbA1c  7. Lipid profile - LDL 129 just recommend exercise and diet.  8. 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  [name], MD 08/09/2014, 3:27 PM

## 2014-08-09 NOTE — Patient Instructions (Signed)
Medication Instructions:   Your physician recommends that you continue on your current medications as directed. Please refer to the Current Medication list given to you today.   Labwork:  TODAY----BMET AND HEMOGLOBIN A1C    Follow-Up:  Your physician wants you to follow-up in: [place] will receive a reminder letter in the mail two months in advance. If you don't receive a letter, please call our office to schedule the follow-up appointment.

## 2014-08-09 NOTE — Addendum Note (Signed)
Addended by: Nuala Alpha on: 08/09/2014 03:55 PM   Modules accepted: Orders

## 2014-08-10 ENCOUNTER — Telehealth: Payer: Self-pay | Admitting: *Deleted

## 2014-08-10 LAB — BASIC METABOLIC PANEL
BUN: 11 mg/dL (ref 6–23)
CO2: 28 mEq/L (ref 19–32)
Calcium: 9.2 mg/dL (ref 8.4–10.5)
Chloride: 105 mEq/L (ref 96–112)
Creatinine, Ser: 0.81 mg/dL (ref 0.40–1.20)
GFR: 100.26 mL/min (ref >60.00)
Glucose, Bld: 96 mg/dL (ref 70–99)
Potassium: 3.4 mEq/L — ABNORMAL LOW (ref 3.5–5.1)
Sodium: 138 mEq/L (ref 135–145)

## 2014-08-10 LAB — HEMOGLOBIN A1C: Hgb A1c MFr Bld: 5.5 % (ref 4.6–6.5)

## 2014-08-10 MED ORDER — SPIRONOLACTONE 25 MG PO TABS: 25.0000 mg | ORAL_TABLET | Freq: Every day | ORAL | Status: DC

## 2014-08-10 NOTE — Telephone Encounter (Signed)
Contacted the pt to inform her that per Dr Meda Coffee her lab results from yesterday showed that she still has hypokalemia with a K of 3.4.  Informed the pt that per Dr Meda Coffee she recommends the pt start taking spironolactone 25 mg po daily to her regimen.  Confirmed the pharmacy of choice with the pt.  Pt verbalized understanding and agrees with this plan.

## 2014-08-10 NOTE — Telephone Encounter (Signed)
-----   Message from [name], MD sent at 08/10/2014 12:47 PM EDT ----- Still has hypokalemia - mild, add spironolactone 25 mg po daily to her regimen

## 2014-08-17 ENCOUNTER — Ambulatory Visit (INDEPENDENT_AMBULATORY_CARE_PROVIDER_SITE_OTHER): Payer: Medicare Other | Admitting: Internal Medicine

## 2014-08-17 ENCOUNTER — Encounter: Payer: Self-pay | Admitting: Internal Medicine

## 2014-08-17 VITALS — BP 150/100 | HR 76 | Ht 66.0 in | Wt 200.2 lb

## 2014-08-17 DIAGNOSIS — K219 Gastro-esophageal reflux disease without esophagitis: Secondary | ICD-10-CM

## 2014-08-17 DIAGNOSIS — R1013 Epigastric pain: Secondary | ICD-10-CM | POA: Diagnosis not present

## 2014-08-17 DIAGNOSIS — K3184 Gastroparesis: Secondary | ICD-10-CM

## 2014-08-17 DIAGNOSIS — R1010 Upper abdominal pain, unspecified: Secondary | ICD-10-CM

## 2014-08-17 MED ORDER — HYOSCYAMINE SULFATE 0.125 MG SL SUBL: 0.1250 mg | SUBLINGUAL_TABLET | Freq: Three times a day (TID) | SUBLINGUAL | Status: DC | PRN

## 2014-08-17 NOTE — Patient Instructions (Addendum)
Please continue Protonix twice daily  Continue Reglan three times daily.  We have sent the following medications to your pharmacy for you to pick up at your convenience: Levsin   Please discontinue carafate (sucralfate)  Please follow up with Dr Hilarie Fredrickson in 1 year.  Please follow up with your primary care doctor about your elevated blood pressure.

## 2014-08-17 NOTE — Progress Notes (Signed)
Subjective:    Patient ID: Erica [name], female    DOB: 04/11/1975, 39 y.o.   MRN: [id number]  HPI Erica [name] is a 39 year old female with a past medical history of GERD, gastroparesis, PUD, breast cancer status post lumpectomy with chemotherapy and XRT, hypertension, pseudotumor cerebri, peripheral neuropathy, diastolic heart failure and obesity who seen in follow-up. She was last seen in June 2015. She is here alone today. She reports she is doing well overall. Her upper abdominal discomfort has continued to improve with Reglan. She is using 10 mg 3 times daily. She's also taken pantoprazole 40 mg twice daily. She tried taking this once daily but was having return of dyspeptic symptoms. These are controlled with twice daily dosing. She tries to follow a GERD diet. Occasionally she will have "twinges" of upper abdominal discomfort but these are more rare. Rare nausea. No vomiting. Denies early satiety. Reports stable weight. Bowel movements have been regular recently with no blood in her stool or melena.   She continues to follow with neurology, Dr. Posey Pronto, and Dr. Meda Coffee with cardiology. Also Dr. Melvyn Novas with pulmonary  Review of Systems  as per history of present illness, otherwise negative  Current Medications, Allergies, Past Medical History, Past Surgical History, Family History and Social History were reviewed in [facility] record.     Objective:   Physical Exam BP 150/100 mmHg  Pulse 76  Ht 5\' 6"  (1.676 m)  Wt 200 lb 4 oz (90.833 kg)  BMI 32.34 kg/m2  LMP 05/02/2014 Constitutional: Well-developed and well-nourished. No distress. HEENT: Normocephalic and atraumatic. Oropharynx is clear and moist. No oropharyngeal exudate. Conjunctivae are normal.  No scleral icterus. Neck: Neck supple. Trachea midline. Cardiovascular: Normal rate, regular rhythm and intact distal pulses. No M/R/G Pulmonary/chest: Effort normal and breath sounds normal. No wheezing, rales or  rhonchi. Abdominal: Soft, nontender, nondistended. Bowel sounds active throughout. There are no masses palpable. No hepatosplenomegaly. Extremities: no clubbing, cyanosis, or edema Lymphadenopathy: No cervical adenopathy noted. Neurological: Alert and oriented to person place and time. Skin: Skin is warm and dry. No rashes noted. Psychiatric: Normal mood and affect. Behavior is normal.  CBC    Component Value Date/Time   WBC 5.7 07/28/2014 1507   WBC 4.9 06/18/2014 1404   RBC 4.73 07/28/2014 1507   RBC 4.50 06/18/2014 1404   HGB 13.3 07/28/2014 1507   HGB 12.8 06/18/2014 1404   HCT 41.1 07/28/2014 1507   HCT 39.5 06/18/2014 1404   PLT 199 07/28/2014 1507   PLT 213 06/18/2014 1404   MCV 86.9 07/28/2014 1507   MCV 87.8 06/18/2014 1404   MCH 28.1 07/28/2014 1507   MCH 28.4 06/18/2014 1404   MCHC 32.3 07/28/2014 1507   MCHC 32.4 06/18/2014 1404   RDW 14.4 07/28/2014 1507   RDW 13.3 06/18/2014 1404   LYMPHSABS 1.4 07/28/2014 1507   LYMPHSABS 1.9 04/04/2014 1611   MONOABS 0.4 07/28/2014 1507   MONOABS 0.5 04/04/2014 1611   EOSABS 0.0 07/28/2014 1507   EOSABS 0.2 04/04/2014 1611   BASOSABS 0.0 07/28/2014 1507   BASOSABS 0.0 04/04/2014 1611    CMP     Component Value Date/Time   NA 138 08/09/2014 1619   NA 143 07/28/2014 1507   K 3.4* 08/09/2014 1619   K 3.2* 07/28/2014 1507   CL 105 08/09/2014 1619   CL 103 09/10/2012 1618   CO2 28 08/09/2014 1619   CO2 25 07/28/2014 1507   GLUCOSE 96 08/09/2014 1619  GLUCOSE 152* 07/28/2014 1507   GLUCOSE 86 09/10/2012 1618   BUN 11 08/09/2014 1619   BUN 8.4 07/28/2014 1507   CREATININE 0.81 08/09/2014 1619   CREATININE 0.9 07/28/2014 1507   CREATININE 0.64 05/02/2011 1021   CALCIUM 9.2 08/09/2014 1619   CALCIUM 9.3 07/28/2014 1507   PROT 7.3 07/28/2014 1507   PROT 7.2 09/15/2013 0702   ALBUMIN 3.7 07/28/2014 1507   ALBUMIN 3.4* 09/15/2013 0702   AST 18 07/28/2014 1507   AST 24 09/15/2013 0702   ALT 23 07/28/2014 1507    ALT 32 09/15/2013 0702   ALKPHOS 62 07/28/2014 1507   ALKPHOS 43 09/15/2013 0702   BILITOT 0.51 07/28/2014 1507   BILITOT <0.2* 09/15/2013 0702   GFRNONAA 82* 06/18/2014 1404   GFRAA >90 06/18/2014 1404       Assessment & Plan:  39 year old female with a past medical history of GERD, gastroparesis, PUD, breast cancer status post lumpectomy with chemotherapy and XRT, hypertension, pseudotumor cerebri, peripheral neuropathy, diastolic heart failure and obesity who seen in follow-up  1. Gastroparesis/dyspepsia/GERD -- constellations of symptoms have improved with antacid and motility agent. She attempted to decrease pantoprazole but symptoms returned. We'll continue pantoprazole 40 mg twice a day. Continue Reglan 10 mg 3 times a day before meals. We again discussed the long-term potential complications with Reglan but she doesn't feel she can get by without this medicine. Domperidone could be an option if she can afford it. I would like her to discuss this with Dr. Meda Coffee in follow-up to ensure this would be okay from a cardiac standpoint. QTc interval would need to be normal before starting and also monitored annually. GERD and gastroparesis diet recommended. She can continue Levsin on an as-needed basis for upper abdominal cramping and I'm discontinuing Carafate.  Follow-up in one year, sooner if necessary

## 2014-08-19 ENCOUNTER — Ambulatory Visit (INDEPENDENT_AMBULATORY_CARE_PROVIDER_SITE_OTHER): Payer: Medicare Other | Admitting: Neurology

## 2014-08-19 ENCOUNTER — Encounter: Payer: Self-pay | Admitting: Neurology

## 2014-08-19 VITALS — BP 160/94 | HR 96 | Wt 199.1 lb

## 2014-08-19 DIAGNOSIS — G932 Benign intracranial hypertension: Secondary | ICD-10-CM

## 2014-08-19 DIAGNOSIS — R55 Syncope and collapse: Secondary | ICD-10-CM

## 2014-08-19 DIAGNOSIS — G620 Drug-induced polyneuropathy: Secondary | ICD-10-CM

## 2014-08-19 DIAGNOSIS — G250 Essential tremor: Secondary | ICD-10-CM

## 2014-08-19 DIAGNOSIS — T451X5A Adverse effect of antineoplastic and immunosuppressive drugs, initial encounter: Secondary | ICD-10-CM

## 2014-08-19 DIAGNOSIS — H5340 Unspecified visual field defects: Secondary | ICD-10-CM | POA: Diagnosis not present

## 2014-08-19 MED ORDER — PRIMIDONE 50 MG PO TABS: 25.0000 mg | ORAL_TABLET | Freq: Every day | ORAL | Status: DC

## 2014-08-19 MED ORDER — ACETAZOLAMIDE 250 MG PO TABS: 1000.0000 mg | ORAL_TABLET | Freq: Three times a day (TID) | ORAL | Status: DC

## 2014-08-19 MED ORDER — PREGABALIN 200 MG PO CAPS: 200.0000 mg | ORAL_CAPSULE | Freq: Three times a day (TID) | ORAL | Status: DC

## 2014-08-19 NOTE — Progress Notes (Signed)
Follow-up Visit   Date: 08/19/2014    Erica [name] MRN: [id number] DOB: 08-01-1975   Interim History: Erica [name] is a 39 y.o. 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    History of present illness: Starting in 2010 after her second course of chemotherapy she developed numbness/tingling of the arm and legs, described as her hands are asleep. She has burning sensation and shaking of her hands. Activity worsens pain and she has difficulty with fine motor tasks such as buttoning, tying shoe laces, and braiding her daughter's hair. She falls about once per month because her legs give out. She was started on Lyrica 50mg  TID around March which has helped slightly.   Of note, she history of pseudotumor cerebri, followed by ophthalmology, and currently takes diamox 500mg  BID. 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 She had lumbar puncture performed in the seated position with documented OP of 43 and 32cc of fluid removed. She had a second lumbar puncture in 2014 at which time OP was 18 mmHg.  - Follow-up 10/15/2013:  No significant difference in pain since increasing Lyrica to 100mg  BID.    - Follow-up 01/18/2014:  She had one interval emergency room visit due to severe headache and had LP which showed OP of 26.  Since then, headache has been under control.  Her leg pain continues to be problematic with no lasting benefit despite increase Lyrica to 200mg  BID.    - UPDATE 03/03/2014:  Doing better after increasing Lyrica to 200mg  TID and lidocaine ointment.  She reports less shaking of her hand and  reports greater than 50% improvement of pain with lidocaine.  She reports having new spells of syncope, which seems to only occur when she is sitting. There is loss of consciousness, lasting only a few seconds.  Following the spells, there is no fatigue, confusion, or weakness. On one ocassion, she felt herself leaning to the side and caught herself when at therapy. She is unaware of the spells.   - UPDATE 03/20/2015:  She comes today with complains of new eye drainage, right eye lid swelling, and "whoosing" sound in her ear which started today.  She also complains that left hand tremors are worse over the past few days.  She was started on Lasix due to leg swelling which has helped, but this had made her potassium come down.  She is taking Lyrica 200mg  TID and lidocaine ointment which helps.  She continues to have syncopal spells, recent sleep study showed OSA and she justed CPAP last night.    - UPDATE 08/19/2014:  I received a call from her ophthalmologist, Dr. Vista Mink with new findings of severe left temporal visual field deficit involving her left eye (good eye).  My office set up urgent CT/A which returned normal and arranged for MRI brain to be done the following week. Patient cancelled her MRI scheduled for 5/9 and rescheduled it to 5/25.  She underwent LP with OP of 27cm of water.  Since her LP, she reports feeling as if her vision is better and eyes don't feel as foggy anymore.   Denies headaches, worsening of vision, or weakness.  Her potassium levels have been persistently low despite  taking supplements and she was recently started on spironolactone.    Medications:  Current Outpatient Prescriptions on File Prior to Visit  Medication Sig Dispense Refill  . acetaminophen (TYLENOL) 500 MG tablet Take 1,000 mg by mouth every 6 (six) hours as needed for moderate pain.    Marland Kitchen albuterol (PROVENTIL HFA;VENTOLIN HFA) 108 (90 BASE) MCG/ACT inhaler Inhale 2 puffs into the lungs every 4 (four) hours as  needed for wheezing or shortness of breath (((PLAN A))). 18 g 5  . albuterol (PROVENTIL) (2.5 MG/3ML) 0.083% nebulizer solution Take 3 mLs (2.5 mg total) by nebulization every 4 (four) hours as needed for wheezing or shortness of breath (((PLAN B))). 300 mL 5  . amLODipine-olmesartan (AZOR) 10-40 MG per tablet Take 1 tablet by mouth daily.    . Biotin 10 MG TABS Take 1 tablet by mouth daily.    . cloNIDine (CATAPRES) 0.3 MG tablet Take 1 tablet (0.3 mg total) by mouth 2 (two) times daily. (Patient taking differently: Take 0.3 mg by mouth 2 (two) times daily. Pt also takes extra tablet when BP is elevated.) 90 tablet 3  . dextromethorphan-guaiFENesin (MUCINEX DM) 30-600 MG per 12 hr tablet Take 1 tablet by mouth every 12 (twelve) hours as needed for cough.    . EPINEPHrine 0.3 mg/0.3 mL IJ SOAJ injection Inject 0.3 mLs (0.3 mg total) into the muscle once. 2 Device 1  . furosemide (LASIX) 40 MG tablet Take 1 tablet (40 mg total) by mouth daily as needed. 30 tablet 6  . hyoscyamine (LEVSIN SL) 0.125 MG SL tablet Place 1 tablet (0.125 mg total) under the tongue every 8 (eight) hours as needed for cramping. 30 tablet 2  . isosorbide mononitrate (IMDUR) 60 MG 24 hr tablet Take 1 tablet (60 mg total) by mouth daily. 30 tablet 11  . LORazepam (ATIVAN) 0.5 MG tablet Take 1 tablet (0.5 mg total) by mouth every 8 (eight) hours as needed for anxiety. 60 tablet 2  . metoCLOPramide (REGLAN) 10 MG tablet Take 1 tablet (10 mg total) by mouth 4 (four) times daily -  with meals and at bedtime. 120 tablet 3  . mometasone (NASONEX) 50 MCG/ACT nasal spray Place 2 sprays into the nose 2 (two) times daily.    . mometasone-formoterol (DULERA) 100-5 MCG/ACT AERO Take 2 puffs first thing in am and then another 2 puffs about 12 hours later. 1 Inhaler 11  . montelukast (SINGULAIR) 10 MG tablet Take 1 tablet (10 mg total) by mouth at bedtime. 30 tablet 5  . nebivolol (BYSTOLIC) 10 MG tablet Take 1 tablet (10 mg total) by mouth  daily. 90 tablet 3  . nitroGLYCERIN (NITROSTAT) 0.4 MG SL tablet Place 1 tablet (0.4 mg total) under the tongue every 5 (five) minutes as needed for chest pain. 90 tablet 3  . ondansetron (ZOFRAN-ODT) 4 MG disintegrating tablet Take 1 tablet (4 mg total) by mouth every 6 (six) hours as needed for nausea or vomiting. 20 tablet 5  . pantoprazole (PROTONIX) 40 MG tablet Take 1 tablet (40 mg total) by mouth 2 (two) times daily before a meal. 60 tablet 3  . potassium chloride SA (K-DUR,KLOR-CON) 20 MEQ tablet Take 20 mEq by mouth 3 (three) times daily.    Marland Kitchen Respiratory Therapy Supplies (FLUTTER) DEVI Use as directed. 1 each 0  . spironolactone (ALDACTONE) 25 MG tablet Take 1 tablet (25 mg total) by mouth daily. 90 tablet 3  . traMADol (ULTRAM) 50 MG tablet Take 50-100 mg by mouth  every 6 (six) hours as needed. 1-2 every 4 hours as needed for cough or pain     No current facility-administered medications on file prior to visit.    Allergies:  Allergies  Allergen Reactions  . Aspirin Shortness Of Breath and Palpitations    Pt can take ibuprofen without reaction  . Doxycycline Anaphylaxis  . Penicillins Shortness Of Breath and Palpitations  . Kiwi Extract Itching and Swelling    Lip swelling  . Strawberry Hives     Review of Systems:  CONSTITUTIONAL: No fevers, chills, night sweats, or weight loss.   EYES: No visual changes or eye pain ENT: No hearing changes.  No history of nose bleeds.   RESPIRATORY: No cough, wheezing and shortness of breath.   CARDIOVASCULAR: Negative for chest pain, and palpitations.   GI: Negative for abdominal discomfort, blood in stools or black stools.  No recent change in bowel habits.   GU:  No history of incontinence.   MUSCLOSKELETAL: No history of joint pain or swelling.  No myalgias.   SKIN: Negative for lesions, rash, and itching.   ENDOCRINE: Negative for cold or heat intolerance, polydipsia or goiter.   PSYCH:  No depression or anxiety symptoms.     NEURO: As Above.   Vital Signs:  BP 160/94 mmHg  Pulse 96  Wt 199 lb 2 oz (90.323 kg)  SpO2 98%  LMP 05/02/2014   Neurological Exam: MENTAL STATUS including orientation to time, place, person is normal.  Speech is not dysarthric.  CRANIAL NERVES:  No papilledema on fundoscopic exam, disc appears sharp bilaterally, disc is pale on the right (old).  She is blind in the right eye - only able to see light.  Visual field testing of the left eye is shows reduced peripheral vision on the left temporal field.  Pupils equal round and reactive to light.  Normal conjugate, extra-ocular eye movements in all directions of gaze.  Face is symmetric.  MOTOR:  Motor strength is 5/5 in all extremities. Left hand with intention tremor when hands outstretched.  MSRs:  Reflexes are 2+/4 throughout.  SENSORY:  Intact to vibration throughout  COORDINATION/GAIT:   Gait narrow based and stable.   Data: [facility], Dr. 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 CT/A head and neck 07/26/2014:  Unremarkable  Lumbar puncture with OP 07/29/2014: Elevated opening pressure it 27 cm of water which was decreased to 16 cm of water.  CSF 09/18/2011: R269 W0 G65 P17, CSF cytology-neg OP 43, 32cc removed   LP 02/05/2013: OP 18, CP 17 after 21 cc removed  LP 10/25/2013:  OP 26, CP 5 after 15 cc removed  Labs 09/17/2011: vitamin B12 725, RPR NR   CT head 02/05/2013: No acute abnormality   MRI brain 08/13/2010:  1. No evidence for metastatic disease to the brain.  2. Scattered subcortical T2 and FLAIR hyperintensities are slightly greater than expected for age. The finding is nonspecific  but can be seen in the setting of chronic microvascular ischemia, a demyelinating process such as multiple sclerosis, vasculitis, complicated migraine headaches, or as the sequelae of a prior infectious or inflammatory process. The  lesions could be related to prior brain radiation if this has been performed.   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  We scheduled MRI brain to be done early this month, but patient rescheduled to next week. 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 She has underwent serial lumbar punctures over the years, but her pressure remain elevated and with her new visual field deficits, I will refer her to neurosurgery for shunt evaluation Increase diamox to 1000mg  TID, she is also taking lasix 40mg  and potassium supplements.  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  Clinically stable. Symptoms manifesting with painful parsthesias of her hands and feet which started following chemotherapy with docetaxel/cyclophosphamide (2010).  Continue Lyrica to 200mg  TID and adding lidocaine ointment to her feet.    Benign essential tremors Start primidone 25mg  daily and uptitrate as needed.  No betablocks given history of asthma  Syncopal spells These episodes are very brief with quick return back to baseline.   Cardiac work-up is negative, EEG is negative Patient informed of [place] law of no driving for 28-months since last spell of impaired consciousness  Return to clinic in 2 months    The duration of this appointment visit was 40 minutes of face-to-face time with the patient.  Greater than 50% of this time was spent in counseling, explanation of diagnosis, planning of further management, and coordination of care.   Thank you for allowing me to  participate in patient's care.  If I can answer any additional questions, I would be pleased to do so.    Sincerely,    Leonna Schlee K. Posey Pronto, DO

## 2014-08-19 NOTE — Patient Instructions (Addendum)
1.  Start primidone 25mg  daily (half-tablet) 2.  Increase diamox to 1000mg  three times daily.   3.  Referral to neurosurgery for shunt evaluation 4.  MRI brain is scheduled for next week 5.  Follow-up with opthalmology next month 6.  Return to clinic in 2 months

## 2014-08-19 NOTE — Progress Notes (Signed)
Note sent

## 2014-08-24 ENCOUNTER — Ambulatory Visit
Admission: RE | Admit: 2014-08-24 | Discharge: 2014-08-24 | Disposition: A | Discharge status: Home / Self Care | Payer: Medicare Other | Source: Ambulatory Visit | Attending: Neurology | Admitting: Neurology

## 2014-08-24 DIAGNOSIS — H5340 Unspecified visual field defects: Secondary | ICD-10-CM | POA: Diagnosis not present

## 2014-08-24 DIAGNOSIS — Z853 Personal history of malignant neoplasm of breast: Secondary | ICD-10-CM | POA: Diagnosis not present

## 2014-08-24 DIAGNOSIS — R55 Syncope and collapse: Secondary | ICD-10-CM | POA: Diagnosis not present

## 2014-08-24 DIAGNOSIS — R251 Tremor, unspecified: Secondary | ICD-10-CM | POA: Diagnosis not present

## 2014-08-24 MED ORDER — GADOBENATE DIMEGLUMINE 529 MG/ML IV SOLN
20.0000 mL | Freq: Once | INTRAVENOUS | Status: AC | PRN
  Administered 2014-08-24: 20 mL via INTRAVENOUS

## 2014-08-25 ENCOUNTER — Ambulatory Visit (HOSPITAL_BASED_OUTPATIENT_CLINIC_OR_DEPARTMENT_OTHER): Payer: Medicare Other

## 2014-08-25 VITALS — BP 193/94 | HR 96 | Temp 99.0°F

## 2014-08-25 DIAGNOSIS — Z5111 Encounter for antineoplastic chemotherapy: Secondary | ICD-10-CM

## 2014-08-25 DIAGNOSIS — C50412 Malignant neoplasm of upper-outer quadrant of left female breast: Secondary | ICD-10-CM

## 2014-08-25 MED ORDER — GOSERELIN ACETATE 3.6 MG ~~LOC~~ IMPL
3.6000 mg | DRUG_IMPLANT | Freq: Once | SUBCUTANEOUS | Status: AC
  Administered 2014-08-25: 3.6 mg via SUBCUTANEOUS
  Filled 2014-08-25: qty 3.6

## 2014-08-30 ENCOUNTER — Ambulatory Visit (INDEPENDENT_AMBULATORY_CARE_PROVIDER_SITE_OTHER): Payer: Medicare Other | Admitting: Adult Health

## 2014-08-30 ENCOUNTER — Encounter: Payer: Self-pay | Admitting: Adult Health

## 2014-08-30 VITALS — BP 160/80 | HR 108 | Temp 98.4°F | Ht 66.0 in | Wt 198.4 lb

## 2014-08-30 DIAGNOSIS — I10 Essential (primary) hypertension: Secondary | ICD-10-CM | POA: Diagnosis not present

## 2014-08-30 DIAGNOSIS — G4733 Obstructive sleep apnea (adult) (pediatric): Secondary | ICD-10-CM

## 2014-08-30 DIAGNOSIS — J4540 Moderate persistent asthma, uncomplicated: Secondary | ICD-10-CM

## 2014-08-30 MED ORDER — TRAMADOL HCL 50 MG PO TABS: ORAL_TABLET | ORAL | Status: DC

## 2014-08-30 MED ORDER — ALBUTEROL SULFATE HFA 108 (90 BASE) MCG/ACT IN AERS: 2.0000 | INHALATION_SPRAY | RESPIRATORY_TRACT | Status: DC | PRN

## 2014-08-30 NOTE — Patient Instructions (Signed)
Continue Dulera 200 2 puffs Twice daily  , rinse after use  Follow med calendar closely and bring to each visit.  Follow up with Primary MD concerning elevated blood pressure .  Follow up Dr. Melvyn Novas  In 3 months and As needed

## 2014-08-30 NOTE — Assessment & Plan Note (Signed)
Compensated on present regimen Patient's medications were reviewed today and patient education was given. Computerized medication calendar was adjusted/completed   Plan  Continue Dulera 200 2 puffs Twice daily  , rinse after use  Follow med calendar closely and bring to each visit.  Follow up with Primary MD concerning elevated blood pressure .  Follow up Dr. Melvyn Novas  In 3 months and As needed

## 2014-08-30 NOTE — Progress Notes (Signed)
Chart and office note reviewed   > agree with a/p as outlined  

## 2014-08-30 NOTE — Assessment & Plan Note (Signed)
Discussed CPAP compliance , pt says she can not wear this .  Encouraged to give it another trial.

## 2014-08-30 NOTE — Progress Notes (Signed)
Subjective:    Patient ID: Erica [name], female    DOB: 10-10-1975   MRN: [id number]    Brief patient profile:  2 yobf with asthma all her life much worse since around 07/2010 referred by Dr [name] to pulmonary clinic in Sept 2012 Hx of Breast Cancer 05/2008    History of Present Illness  12/18/2010 Initial pulmonary office eval on acei and  advair cc dtc asthma x 4 months but baseline = excess saba use "even when better" at least twice daily despite daily advair, worse at hs in fact  used saba 3 x last night.  On chemo /RT for breast ca but last rx was Jan 2012 well before asthma worse.  To er multiple times >  no better with singular added x 3 months. rec Stop Lisinopril Dulera 200 Take 2 puffs first thing in am and then another 2 puffs about 12 hours later.  Work on inhaler technique  Nexium 40 mg   Take 30-60 min before first meal of the day and Pepcid 20 mg one bedtime until return Only use your albuterol as a rescue medication to be used if you can't catch your breath by resting or doing a relaxed purse lip breathing pattern. The less you use it, the better it will work when you need it.  Prednisone 10 mg take  4 each am x 2 days,   2 each am x 2 days,  1 each am x2days and stop    01/03/2011 f/u ov/Wert cc much better off ace and advair but still needing saba twice daily on avg. No  Purulent sputum. rec no change in rx/ rule of 2's reviewed     04/06/2013 NP Follow up and Med Review   We reviewed all her meds and organized them into a med calendar. She does have Medicaid but depends on samples. Did not bring all her meds with her today , says they are at home.  Says her breathing is the same since last ov w/ SOB, Has on /off wheezing, some chest heaviness, prod cough with yellow mucus. Good and bad days. Wears out easily .  Denies f/c/s, nausea, vomiting.  Did not stop singulair as recommended.  rec  Follow med calendar closely and bring to each visit > did not do  We are  referring you for a CPSTwith spirometry before and after> never done    04/04/2014 f/u ov/Wert re: chronic asthma/ cough/ inconsistent with f/u / use of med calendar  Chief Complaint  Patient presents with  . Follow-up    Pt states that her cough is unchanged. She denies any new co's today.   75% better typically after prednisone and then worse off same pattern x one year Cough worse when lie down > yellowish thick most productive in am  Worse chest  burning off ppi but no change on breathing or coughing with overt HB Not needing saba for breathing, uses neb "helps cough" once a day avg  >>change to Dulera 100, CXR   04/18/2014 Follow up and Med Review / NP  Patient returns for a two-week follow-up and medication review. We reviewed all her medications organize them into a medication count with patient education It appears that she is taking her medications correctly, however, she has over 30 medications and has had several changes recently. Last visit, her [name] was decreased to the 17mcg says that her breathing was better on the 275mcg Patient had a chest x-ray last visit that  showed no acute changes She underwent a CT sinus due to persistent cough , which was positive for sinusitis on the right She was treated with a ten-day course of Avelox, she has one dose left Does feel some better but still has cough . She denies any hemoptysis, orthopnea, PND or leg swelling  Patient has upcoming sleep study. Next week for suspected underlying sleep apnea with snoring and daytime sleepiness rec Refer to ENT for sinusitis and chronic cough > never saw one> f/u sinus CT neg 07/26/14  Increase Dulera 200 2 puffs Twice daily   Follow med calendar closely and bring to each visit.     08/01/2014 f/u ov/Wert re:  Asthma plus noct "gasping"  Chief Complaint  Patient presents with  . Follow-up    doing well.  no complaints.  not doing well x one year with noct / early  4am with cough/ gasping for  air almost always goes to neb and then can't go back to sleep even 30 min later better Daytime in chest tightness resolves  completely while on prednisone last took one month prior to OV  And doesn't have it now but has exact same noct symptoms and overusing saba   Doe x mailbox and back to house out of breath and coughing  >>change dulera 200 to 100   08/30/2014 Follow up and Med review : Asthma Patient presents for a one-month follow-up We reviewed all her medications organize them into a medication count with patient education Patient has had multiple medication changes recently She has been having difficulty with resistant hypertension and visual changes. Recently started on primidone and spironolactone . However, has not started this as of yet That she needs to go pick this up at the pharmacy. Neuro notes reviewed , has been referred to NS . B/p is elevated again at todays visit, discussed follow up with PCP for b/p management.   She tried to decrease her Dulera as recommended from last ov with Dr. Melvyn Novas    however, was unable to and is back on 200 dose .  Says overall her breathing is doing ok  No flare of cough or wheezing  Does have a lot of drainage in throat . Discussed starting on chlortrimeton.  She denies any fever, chest pain, orthopnea, PND, or increased leg swelling    Current Medications, Allergies, Complete Past Medical History, Past Surgical History, Family History, and Social History were reviewed in [employer] record.  ROS  The following are not active complaints unless bolded sore throat, dysphagia, dental problems, itching, sneezing,  nasal congestion or excess/ purulent secretions, ear ache,   fever, chills, sweats, unintended wt loss, pleuritic or exertional cp, hemoptysis,  orthopnea pnd or leg swelling, presyncope, palpitations, heartburn, abdominal pain, anorexia, nausea, vomiting, diarrhea  or change in bowel or urinary habits, change in  stools or urine, dysuria,hematuria,  rash, arthralgias, visual complaints, headache, numbness weakness or ataxia or problems with walking or coordination,  change in mood/affect or memory.                  Objective:   Physical Exam  Obese bf nad   Wt 213  12/18/10 > 01/03/2011  214 > 01/31/2011  206 >  09/11/2012 209 >208 6/19 > 210 09/25/2012 > 11/20/2012 211 >208 12/07/12> 01/12/2013 209 > 02/09/2013 210 >207 04/06/2013 > 07/14/2013  213>212 07/28/2013 >204 01/26/2014> 04/04/2014 206  >205 04/18/2014 >  07/18/2014 201 >  08/01/14 201 >198 08/30/2014  HEENT: nl dentition, turbinates, and orophanx. Nl external ear canals without cough reflex, NM swollen    NECK :  without JVD/Nodes/TM/ nl carotid upstrokes bilaterally   LUNGS: lungs clear bilaterally with good air movement   CV:  RRR  no s3 or murmur or increase in P2, no edema   ABD:  soft and nontender with nl excursion in the supine position. No bruits or organomegaly, bowel sounds nl  MS:  warm without deformities, calf tenderness, cyanosis or clubbing     I personally reviewed images and agree with radiology impression as follows:  CXR:  06/08/14  No active cardiopulmonary disease.     Labs ordered/ reviewed:       Lab 07/28/14 1507  NA 143  K 3.2*  CO2 25  BUN 8.4  CREATININE 0.9  GLUCOSE 152*       Lab 07/28/14 1507  HGB 13.3  HCT 41.1  WBC 5.7  PLT 199     Lab Results  Component Value Date   TSH 1.70 08/01/2014     Lab Results  Component Value Date   PROBNP 16.0 08/01/2014

## 2014-08-30 NOTE — Assessment & Plan Note (Signed)
Follow up with Primary MD concerning elevated blood pressure .

## 2014-08-31 ENCOUNTER — Other Ambulatory Visit: Payer: Self-pay | Admitting: Nurse Practitioner

## 2014-08-31 DIAGNOSIS — G932 Benign intracranial hypertension: Secondary | ICD-10-CM | POA: Diagnosis not present

## 2014-08-31 DIAGNOSIS — I10 Essential (primary) hypertension: Secondary | ICD-10-CM | POA: Diagnosis not present

## 2014-09-01 NOTE — Telephone Encounter (Signed)
Last ov 07/28/14.  Next ov 10/20/14.  Chart reviewed

## 2014-09-06 NOTE — Addendum Note (Signed)
Addended by: Parke Poisson E on: 09/06/2014 05:47 PM   Modules accepted: Orders, Medications

## 2014-09-08 NOTE — Addendum Note (Signed)
Addended by: Parke Poisson E on: 09/08/2014 04:41 PM   Modules accepted: Orders, Medications

## 2014-09-12 ENCOUNTER — Other Ambulatory Visit: Payer: Self-pay | Admitting: Internal Medicine

## 2014-09-14 ENCOUNTER — Telehealth: Payer: Self-pay | Admitting: Adult Health

## 2014-09-14 NOTE — Telephone Encounter (Signed)
Received notice from [facility] that pt's Proventil HFA and Dulera 200 are no longer covered by insurance Alternatives were provided:  Proventil HFA >> Ventolin HFA, Xopenex HFA Dulera >> Symbicort, Advair HFA, Advair Diskus, Breo  Discussed with TP: okay to change to Ventolin and Symbicort 160.  Called spoke with patient to discuss the above.  Pt stated she believes that she has been on Symbicort in the past, but chart does not support this.  Verified in the chart that she has been on Advair previously and was changed over to [facility] in 2012.  Advised pt that typically insurance will not cover unless pt has tried and failed 2 or more covered alternatives.  She is okay with trying the Symbicort and will call the office to update [place] if she feels it does not control her symptoms.  Rx's sent to verified pharmacy. Nothing further needed; will sign off.

## 2014-09-18 ENCOUNTER — Emergency Department (HOSPITAL_COMMUNITY): Payer: Medicare Other

## 2014-09-18 ENCOUNTER — Emergency Department (HOSPITAL_COMMUNITY)
Admission: EM | Admit: 2014-09-18 | Discharge: 2014-09-18 | Disposition: A | Discharge status: Home / Self Care | Payer: Medicare Other | Source: Home / Self Care | Attending: Emergency Medicine | Admitting: Emergency Medicine

## 2014-09-18 DIAGNOSIS — R9431 Abnormal electrocardiogram [ECG] [EKG]: Secondary | ICD-10-CM | POA: Diagnosis not present

## 2014-09-18 DIAGNOSIS — Z880 Allergy status to penicillin: Secondary | ICD-10-CM

## 2014-09-18 DIAGNOSIS — Z9981 Dependence on supplemental oxygen: Secondary | ICD-10-CM

## 2014-09-18 DIAGNOSIS — R55 Syncope and collapse: Secondary | ICD-10-CM

## 2014-09-18 DIAGNOSIS — E872 Acidosis: Secondary | ICD-10-CM | POA: Diagnosis not present

## 2014-09-18 DIAGNOSIS — G9340 Encephalopathy, unspecified: Secondary | ICD-10-CM | POA: Diagnosis not present

## 2014-09-18 DIAGNOSIS — IMO0001 Reserved for inherently not codable concepts without codable children: Secondary | ICD-10-CM

## 2014-09-18 DIAGNOSIS — E876 Hypokalemia: Secondary | ICD-10-CM | POA: Diagnosis present

## 2014-09-18 DIAGNOSIS — R0602 Shortness of breath: Secondary | ICD-10-CM | POA: Diagnosis not present

## 2014-09-18 DIAGNOSIS — R42 Dizziness and giddiness: Secondary | ICD-10-CM | POA: Diagnosis not present

## 2014-09-18 DIAGNOSIS — R410 Disorientation, unspecified: Secondary | ICD-10-CM | POA: Diagnosis not present

## 2014-09-18 DIAGNOSIS — I10 Essential (primary) hypertension: Secondary | ICD-10-CM | POA: Diagnosis present

## 2014-09-18 DIAGNOSIS — R030 Elevated blood-pressure reading, without diagnosis of hypertension: Secondary | ICD-10-CM

## 2014-09-18 DIAGNOSIS — E860 Dehydration: Secondary | ICD-10-CM | POA: Diagnosis present

## 2014-09-18 DIAGNOSIS — Z7951 Long term (current) use of inhaled steroids: Secondary | ICD-10-CM

## 2014-09-18 DIAGNOSIS — G4733 Obstructive sleep apnea (adult) (pediatric): Secondary | ICD-10-CM | POA: Diagnosis present

## 2014-09-18 DIAGNOSIS — Z862 Personal history of diseases of the blood and blood-forming organs and certain disorders involving the immune mechanism: Secondary | ICD-10-CM

## 2014-09-18 DIAGNOSIS — R4182 Altered mental status, unspecified: Secondary | ICD-10-CM | POA: Diagnosis not present

## 2014-09-18 DIAGNOSIS — F10120 Alcohol abuse with intoxication, uncomplicated: Secondary | ICD-10-CM | POA: Diagnosis present

## 2014-09-18 DIAGNOSIS — S299XXA Unspecified injury of thorax, initial encounter: Secondary | ICD-10-CM | POA: Diagnosis not present

## 2014-09-18 DIAGNOSIS — F10920 Alcohol use, unspecified with intoxication, uncomplicated: Secondary | ICD-10-CM

## 2014-09-18 DIAGNOSIS — M545 Low back pain: Secondary | ICD-10-CM | POA: Diagnosis not present

## 2014-09-18 DIAGNOSIS — Z3202 Encounter for pregnancy test, result negative: Secondary | ICD-10-CM | POA: Diagnosis present

## 2014-09-18 DIAGNOSIS — Z79899 Other long term (current) drug therapy: Secondary | ICD-10-CM

## 2014-09-18 DIAGNOSIS — J45909 Unspecified asthma, uncomplicated: Secondary | ICD-10-CM | POA: Diagnosis present

## 2014-09-18 DIAGNOSIS — J9811 Atelectasis: Secondary | ICD-10-CM | POA: Diagnosis not present

## 2014-09-18 DIAGNOSIS — R404 Transient alteration of awareness: Secondary | ICD-10-CM | POA: Diagnosis not present

## 2014-09-18 DIAGNOSIS — N179 Acute kidney failure, unspecified: Principal | ICD-10-CM | POA: Diagnosis present

## 2014-09-18 DIAGNOSIS — R062 Wheezing: Secondary | ICD-10-CM | POA: Diagnosis not present

## 2014-09-18 DIAGNOSIS — S3992XA Unspecified injury of lower back, initial encounter: Secondary | ICD-10-CM | POA: Diagnosis present

## 2014-09-18 DIAGNOSIS — I5032 Chronic diastolic (congestive) heart failure: Secondary | ICD-10-CM | POA: Diagnosis present

## 2014-09-18 DIAGNOSIS — K219 Gastro-esophageal reflux disease without esophagitis: Secondary | ICD-10-CM | POA: Diagnosis present

## 2014-09-18 DIAGNOSIS — Z853 Personal history of malignant neoplasm of breast: Secondary | ICD-10-CM

## 2014-09-18 DIAGNOSIS — IMO0002 Reserved for concepts with insufficient information to code with codable children: Secondary | ICD-10-CM

## 2014-09-18 DIAGNOSIS — Z9181 History of falling: Secondary | ICD-10-CM

## 2014-09-18 LAB — BASIC METABOLIC PANEL
Anion gap: 8 (ref 5–15)
BUN: 12 mg/dL (ref 6–20)
CHLORIDE: 116 mmol/L — AB (ref 101–111)
CO2: 19 mmol/L — ABNORMAL LOW (ref 22–32)
CREATININE: 0.93 mg/dL (ref 0.44–1.00)
Calcium: 8.8 mg/dL — ABNORMAL LOW (ref 8.9–10.3)
Glucose, Bld: 100 mg/dL — ABNORMAL HIGH (ref 65–99)
Potassium: 2.9 mmol/L — ABNORMAL LOW (ref 3.5–5.1)
Sodium: 143 mmol/L (ref 135–145)

## 2014-09-18 LAB — RAPID URINE DRUG SCREEN, HOSP PERFORMED
Amphetamines: NOT DETECTED
BENZODIAZEPINES: NOT DETECTED
Barbiturates: NOT DETECTED
Cocaine: NOT DETECTED
OPIATES: NOT DETECTED
Tetrahydrocannabinol: NOT DETECTED

## 2014-09-18 LAB — CBC
HCT: 40.2 % (ref 36.0–46.0)
Hemoglobin: 13.4 g/dL (ref 12.0–15.0)
MCH: 29.7 pg (ref 26.0–34.0)
MCHC: 33.3 g/dL (ref 30.0–36.0)
MCV: 89.1 fL (ref 78.0–100.0)
Platelets: 241 10*3/uL (ref 150–400)
RBC: 4.51 MIL/uL (ref 3.87–5.11)
RDW: 13.9 % (ref 11.5–15.5)
WBC: 7.5 10*3/uL (ref 4.0–10.5)

## 2014-09-18 LAB — I-STAT CHEM 8, ED
BUN: 11 mg/dL (ref 6–20)
CALCIUM ION: 1.22 mmol/L (ref 1.12–1.23)
CREATININE: 0.9 mg/dL (ref 0.44–1.00)
Chloride: 113 mmol/L — ABNORMAL HIGH (ref 101–111)
GLUCOSE: 98 mg/dL (ref 65–99)
HCT: 48 % — ABNORMAL HIGH (ref 36.0–46.0)
Hemoglobin: 16.3 g/dL — ABNORMAL HIGH (ref 12.0–15.0)
Potassium: 3.9 mmol/L (ref 3.5–5.1)
SODIUM: 144 mmol/L (ref 135–145)
TCO2: 18 mmol/L (ref 0–100)

## 2014-09-18 LAB — I-STAT ARTERIAL BLOOD GAS, ED
Acid-base deficit: 10 mmol/L — ABNORMAL HIGH (ref 0.0–2.0)
Bicarbonate: 16.5 mEq/L — ABNORMAL LOW (ref 20.0–24.0)
O2 SAT: 94 %
Patient temperature: 98
TCO2: 18 mmol/L (ref 0–100)
pCO2 arterial: 37.5 mmHg (ref 35.0–45.0)
pH, Arterial: 7.251 — ABNORMAL LOW (ref 7.350–7.450)
pO2, Arterial: 82 mmHg (ref 80.0–100.0)

## 2014-09-18 LAB — ETHANOL: ALCOHOL ETHYL (B): 133 mg/dL — AB (ref <5)

## 2014-09-18 LAB — PREGNANCY, URINE: Preg Test, Ur: NEGATIVE

## 2014-09-18 MED ORDER — SODIUM CHLORIDE 0.9 % IV BOLUS (SEPSIS): 1000.0000 mL | Freq: Once | INTRAVENOUS | Status: DC

## 2014-09-18 MED ORDER — CLONIDINE HCL 0.2 MG PO TABS
0.3000 mg | ORAL_TABLET | Freq: Two times a day (BID) | ORAL | Status: DC
  Administered 2014-09-18: 0.3 mg via ORAL
  Filled 2014-09-18 (×2): qty 1

## 2014-09-18 MED ORDER — IRBESARTAN 300 MG PO TABS
300.0000 mg | ORAL_TABLET | Freq: Every day | ORAL | Status: DC
  Administered 2014-09-18: 300 mg via ORAL
  Filled 2014-09-18 (×2): qty 1

## 2014-09-18 MED ORDER — AMMONIA AROMATIC IN INHA
0.3000 mL | Freq: Once | RESPIRATORY_TRACT | Status: DC
Start: 2014-09-18 — End: 2014-09-18
  Filled 2014-09-18: qty 10

## 2014-09-18 MED ORDER — AMMONIA AROMATIC IN INHA
RESPIRATORY_TRACT | Status: AC
  Administered 2014-09-18: 05:00:00
  Filled 2014-09-18: qty 10

## 2014-09-18 MED ORDER — POTASSIUM CHLORIDE CRYS ER 20 MEQ PO TBCR
40.0000 meq | EXTENDED_RELEASE_TABLET | Freq: Once | ORAL | Status: AC
  Administered 2014-09-18: 40 meq via ORAL
  Filled 2014-09-18: qty 2

## 2014-09-18 MED ORDER — DILTIAZEM HCL ER COATED BEADS 240 MG PO CP24: 240.0000 mg | ORAL_CAPSULE | Freq: Every day | ORAL | Status: DC

## 2014-09-18 MED ORDER — AMLODIPINE-OLMESARTAN 10-40 MG PO TABS: 1.0000 | ORAL_TABLET | Freq: Every day | ORAL | Status: DC

## 2014-09-18 MED ORDER — AMLODIPINE BESYLATE 5 MG PO TABS
10.0000 mg | ORAL_TABLET | Freq: Every day | ORAL | Status: DC
  Administered 2014-09-18: 10 mg via ORAL
  Filled 2014-09-18: qty 2

## 2014-09-18 MED ORDER — POTASSIUM CHLORIDE 10 MEQ/100ML IV SOLN
10.0000 meq | Freq: Once | INTRAVENOUS | Status: AC
  Administered 2014-09-18: 10 meq via INTRAVENOUS
  Filled 2014-09-18: qty 100

## 2014-09-18 MED ORDER — SODIUM CHLORIDE 0.9 % IV BOLUS (SEPSIS)
1000.0000 mL | Freq: Once | INTRAVENOUS | Status: AC
  Administered 2014-09-18: 1000 mL via INTRAVENOUS

## 2014-09-18 NOTE — ED Notes (Addendum)
Patient stated she has left arm restriction, IV team placed armband on left arm.

## 2014-09-18 NOTE — ED Notes (Signed)
Remains in xray department at this time.

## 2014-09-18 NOTE — ED Notes (Signed)
Back from CT.  Remains incredibly drowsy snoring loudly.

## 2014-09-18 NOTE — ED Notes (Signed)
Continues to be very somnolent.  No distress noted at this time.

## 2014-09-18 NOTE — Discharge Instructions (Signed)
Please follow with your primary care doctor in the next 2 days for a check-up. They must obtain records for further management.  ° °Do not hesitate to return to the Emergency Department for any new, worsening or concerning symptoms.  ° °

## 2014-09-18 NOTE — ED Provider Notes (Signed)
PROGRESS NOTE                                                                                                                 This is a sign-out from [place] at shift change: Erica [name] is a 39 y.o. female presenting with syncopal events, she fell hurting her low back and alcohol intoxication. No objective signs of head trauma however, patient is difficult to rouse. She is protecting her airway. EKG with no arrhythmia, no prolonged QT. Plan is to follow-up CTs, ABG and replete potassium.  Please refer to previous note for full HPI, ROS, PMH and PE.   Patient's ABG with a pH of 7.251, patient reevaluated and she is alert, oriented 3 and mentating appropriately. Likely very dehydrated. Chest x-ray without abnormality, lumbar spine head and cervical spine CT are negative. Blood work/UDS otherwise unremarkable Will bolus, replete potassium and recheck a chemistry.   Patient seen and evaluated, she remains alert and reports subjective improvement. She is tolerated by mouth, past trial of ambulation. Blood pressure is significantly elevated, states that she didn't have her morning for evening doses. Will give her her morning meds.   Filed Vitals:   09/18/14 0745 09/18/14 0805 09/18/14 0843 09/18/14 0947  BP: 161/92 153/78 138/83 188/95  Pulse: 91 83 78 84  Temp:  98.2 F (36.8 C)    TempSrc:  Oral    Resp: 19 13 16 20   Height:      Weight:      SpO2: 95% 97% 97% 97%    Medications  ammonia inhalant 0.3 mL (0.3 mLs Inhalation Not Given 09/18/14 0526)  sodium chloride 0.9 % bolus 1,000 mL (not administered)  amLODipine-olmesartan (AZOR) 10-40 MG per tablet 1 tablet (not administered)  cloNIDine (CATAPRES) tablet 0.3 mg (0.3 mg Oral Given 09/18/14 1033)  ammonia inhalant (  Given 09/18/14 0526)  sodium chloride 0.9 % bolus 1,000 mL (1,000 mLs Intravenous New Bag/Given 09/18/14 1033)  sodium chloride 0.9 % bolus 1,000 mL (0 mLs Intravenous Stopped 09/18/14 1001)  potassium chloride 10 mEq  in 100 mL IVPB (0 mEq Intravenous Stopped 09/18/14 1001)  potassium chloride SA (K-DUR,KLOR-CON) CR tablet 40 mEq (40 mEq Oral Given 09/18/14 1033)    Evaluation does not show pathology that would require ongoing emergent intervention or inpatient treatment. Pt is hemodynamically stable and mentating appropriately. Discussed findings and plan with patient/guardian, who agrees with care plan. All questions answered. Return precautions discussed and outpatient follow up given.     Monico Blitz, PA-C 09/18/14 1133  Tanna Furry, MD 09/27/14 [phone]

## 2014-09-18 NOTE — ED Notes (Addendum)
Patient was given a ginger ale with a cup of ice. Patient is sitting on side of bed.

## 2014-09-18 NOTE — ED Notes (Signed)
Was walking into apartment and states I didn't feel right and passed out.  Was alert and talking on arrival of EMS.  C/O low back pain that started after passing out.  Feels tired and dizzy.

## 2014-09-18 NOTE — ED Notes (Signed)
Patient transported to X-ray 

## 2014-09-18 NOTE — ED Notes (Signed)
Pt able to tolerate PO fluids.  

## 2014-09-18 NOTE — ED Notes (Signed)
Ammonia tablet pulled per request of provider.

## 2014-09-18 NOTE — ED Notes (Signed)
In room to check on patient.  Climbing out of the bottom of the bed.  States I have to go pee.  Assisted to ambulate to the bathroom.  Gait very unsteady.  Neck collar found laying in the floor.  Removed by patient.

## 2014-09-18 NOTE — ED Provider Notes (Signed)
CSN: [id number]     Arrival date & time 09/18/14  0202 History   First MD Initiated Contact with Patient 09/18/14 0404     Chief Complaint  Patient presents with  . Loss of Consciousness     (Consider location/radiation/quality/duration/timing/severity/associated sxs/prior Treatment) HPI Comments: Patient is a level V caveat d/t ETOH intoxication. Patient just states "I fell."   Per report patient was walking into apartment and states I didn't feel right and passed out. Was alert and talking on arrival of EMS. C/O low back pain that started after passing out. Feels tired and dizzy.         Patient is a 39 y.o. female presenting with syncope.  Loss of Consciousness   Past Medical History  Diagnosis Date  . Asthma   . Hypertension   . GERD (gastroesophageal reflux disease)   . Impaired glucose tolerance 04/13/2011  . Anemia, unspecified 04/13/2011  . Asthma 09/27/2010  . Bloating 03/18/2011  . 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 Lymphadenitis, chronic     restricted LEFT extremity  . Dyspnea on exertion     a. 01/2013 Lexi MV: EF 54%, no ischemia/infarct;  b. 05/2013 Echo: EF 60-65%, no rwma, Gr 2 DD;  b.   . OSA (obstructive sleep apnea) 05/04/2014    AHI 31/hr now on CPAP at 12cm H2O  . Breast cancer 2010    a. locally advanced left breast carcinoma diagnosed in 2010 and treated with neoadjuvant chemotherapy with docetaxel and cyclophosphamide as well as paclitaxel. This was followed by radiation therapy which was completed in 2011.  . Fatty liver   . Gastroparesis    Past Surgical History  Procedure Laterality Date  . Hand surgery  2012    right; tendon release  . Breast lumpectomy  09/2008    left  . Carpal tunnel release Left 2011  . Tubal ligation  05/11/2011    Procedure: POST PARTUM TUBAL LIGATION;  Surgeon: Emeterio Reeve, MD;  Location: [facility];  Service: Gynecology;  Laterality: Bilateral;  Induced for HTN  . Lymph node dissection  2010    left breast; 2 wks  after breast lumpectomy   Family History  Problem Relation Age of Onset  . Diabetes Maternal Grandmother   . Heart disease Maternal Grandmother   . Hypertension Maternal Grandmother   . Cancer Neg Hx   . Alcohol abuse Neg Hx   . Early death Neg Hx   . Hyperlipidemia Neg Hx   . Kidney disease Neg Hx   . Stroke Neg Hx   . Colon cancer Neg Hx    History  Substance Use Topics  . Smoking status: Never Smoker   . Smokeless tobacco: Never Used  . Alcohol Use: 0.0 oz/week     Comment: occasionally   OB History    Gravida Para Term Preterm AB TAB SAB Ectopic Multiple Living   5 4 4  1  1   4      Review of Systems  Unable to perform ROS: Other  Cardiovascular: Positive for syncope.      Allergies  Aspirin; Doxycycline; Penicillins; Kiwi extract; and Strawberry  Home Medications   Prior to Admission medications   Medication Sig Start Date End Date Taking? Authorizing Provider  acetaZOLAMIDE (DIAMOX) 250 MG tablet Take 4 tablets (1,000 mg total) by mouth 3 (three) times daily. 08/19/14   Donika K Patel, DO  albuterol (PROVENTIL HFA;VENTOLIN HFA) 108 (90 BASE) MCG/ACT inhaler Inhale 2 puffs into the lungs  every 4 (four) hours as needed for wheezing or shortness of breath (((PLAN A))). 08/30/14   Tammy S Parrett, NP  albuterol (PROVENTIL) (2.5 MG/3ML) 0.083% nebulizer solution Take 3 mLs (2.5 mg total) by nebulization every 4 (four) hours as needed for wheezing or shortness of breath (((PLAN B))). 01/26/14   Tammy S Parrett, NP  amLODipine-olmesartan (AZOR) 10-40 MG per tablet Take 1 tablet by mouth daily.    Historical Provider, MD  Biotin 10 MG TABS Take 1 tablet by mouth daily.    Historical Provider, MD  chlorpheniramine (CHLOR-TRIMETON) 4 MG tablet 2 tabs by mouth at bedtime    Historical Provider, MD  cloNIDine (CATAPRES) 0.3 MG tablet Take 1 tablet (0.3 mg total) by mouth 2 (two) times daily. 08/24/13   [name], MD  dextromethorphan-guaiFENesin [facility] DM) 30-600 MG  per 12 hr tablet Take 1 tablet by mouth every 12 (twelve) hours as needed for cough.    Historical Provider, MD  diltiazem (CARDIZEM CD) 240 MG 24 hr capsule Take 240 mg by mouth daily.    Historical Provider, MD  EPINEPHrine 0.3 mg/0.3 mL IJ SOAJ injection Inject 0.3 mLs (0.3 mg total) into the muscle once. 01/28/14   [name], MD  furosemide (LASIX) 40 MG tablet Take 1 tablet (40 mg total) by mouth daily as needed. 03/14/14   [name], MD  hyoscyamine (LEVSIN SL) 0.125 MG SL tablet Place 1 tablet (0.125 mg total) under the tongue every 8 (eight) hours as needed for cramping. 08/17/14   Jerene Bears, MD  isosorbide mononitrate (IMDUR) 60 MG 24 hr tablet Take 1 tablet (60 mg total) by mouth daily. 02/08/14   [name], MD  LORazepam (ATIVAN) 0.5 MG tablet TAKE 1 TABLET BY MOUTH EVERY 8 HOURS AS NEEDED FOR ANXIETY 09/01/14   Chauncey Cruel, MD  metoCLOPramide (REGLAN) 10 MG tablet Take 1 tablet (10 mg total) by mouth 4 (four) times daily -  with meals and at bedtime. 01/26/13   Jerene Bears, MD  metoCLOPramide (REGLAN) 10 MG tablet TAKE 1 TABLET BY MOUTH FOUR TIMES DAILY WITH MEALS AND AT BEDTIME 09/12/14   Jerene Bears, MD  mometasone (NASONEX) 50 MCG/ACT nasal spray Place 2 sprays into the nose 2 (two) times daily.    Historical Provider, MD  mometasone-formoterol (DULERA) 200-5 MCG/ACT AERO Inhale 2 puffs into the lungs 2 (two) times daily.    Historical Provider, MD  montelukast (SINGULAIR) 10 MG tablet Take 1 tablet (10 mg total) by mouth at bedtime. 01/26/14   Tammy S Parrett, NP  nebivolol (BYSTOLIC) 10 MG tablet Take 1 tablet (10 mg total) by mouth daily. 05/03/14   [name], MD  nitroGLYCERIN (NITROSTAT) 0.4 MG SL tablet Place 1 tablet (0.4 mg total) under the tongue every 5 (five) minutes as needed for chest pain. 08/24/13   [name], MD  ondansetron (ZOFRAN-ODT) 4 MG disintegrating tablet Take 1 tablet (4 mg total) by mouth every 6 (six) hours as needed  for nausea or vomiting. 01/28/14   [name], MD  pantoprazole (PROTONIX) 40 MG tablet Take 1 tablet (40 mg total) by mouth 2 (two) times daily before a meal. 09/02/13   Jerene Bears, MD  potassium chloride SA (K-DUR,KLOR-CON) 20 MEQ tablet Take 20 mEq by mouth 2 (two) times daily.     Historical Provider, MD  pregabalin (LYRICA) 200 MG capsule Take 1 capsule (200 mg total) by mouth 3 (three)  times daily. 08/19/14   Alda Berthold, DO  Respiratory Therapy Supplies (FLUTTER) DEVI Use as directed. 04/19/14   Tammy S Parrett, NP  sucralfate (CARAFATE) 1 G tablet Take 1 g by mouth 4 (four) times daily.    Historical Provider, MD  traMADol (ULTRAM) 50 MG tablet 1-2 every 4 hours as needed for cough or pain 08/30/14   Tammy S Parrett, NP   BP 135/85 mmHg  Pulse 94  Temp(Src) 98 F (36.7 C) (Oral)  Resp 24  Ht 5\' 6"  (1.676 m)  Wt 198 lb (89.812 kg)  BMI 31.97 kg/m2  SpO2 98% Physical Exam  Constitutional: She appears well-developed and well-nourished. She is sleeping.  Asleep snoring in room. Difficult to arouse, but able to be awoken.   HENT:  Head: Normocephalic and atraumatic.  Right Ear: External ear normal.  Left Ear: External ear normal.  Nose: Nose normal.  Eyes: Pupils are equal, round, and reactive to light.  Neck: Neck supple.  Cardiovascular: Normal rate, regular rhythm and normal heart sounds.   Pulmonary/Chest: Effort normal.  Abdominal: Soft. There is no tenderness.  Musculoskeletal: She exhibits no edema.  Neurological:  Follows limited commands.   Nursing note and vitals reviewed.   ED Course  Procedures (including critical care time) Medications  ammonia inhalant 0.3 mL (0.3 mLs Inhalation Not Given 09/18/14 0526)  ammonia inhalant (  Given 09/18/14 0526)    Labs Review Labs Reviewed  BASIC METABOLIC PANEL - Abnormal; Notable for the following:    Potassium 2.9 (*)    Chloride 116 (*)    CO2 19 (*)    Glucose, Bld 100 (*)    Calcium 8.8 (*)    All other  components within normal limits  ETHANOL - Abnormal; Notable for the following:    Alcohol, Ethyl (B) 133 (*)    All other components within normal limits  URINE RAPID DRUG SCREEN, HOSP PERFORMED  CBC  PREGNANCY, URINE  POC URINE PREG, ED    Imaging Review Dg Lumbar Spine Complete  09/18/2014   CLINICAL DATA:  Low back pain.  Possible syncope.  EXAM: LUMBAR SPINE - COMPLETE 4+ VIEW  COMPARISON:  CT abdomen and pelvis 09/15/2013  FINDINGS: There is no evidence of lumbar spine fracture. Alignment is normal. Intervertebral disc spaces are maintained.  IMPRESSION: Negative.   Electronically Signed   By: Lucienne Capers M.D.   On: 09/18/2014 06:02     EKG Interpretation   Date/Time:  Sunday September 18 2014 02:18:42 EDT Ventricular Rate:  91 PR Interval:  151 QRS Duration: 83 QT Interval:  356 QTC Calculation: 438 R Axis:   53 Text Interpretation:  Sinus rhythm Consider left ventricular hypertrophy  Borderline T abnormalities, inferior leads No significant change since  last tracing Confirmed by Christy Gentles  MD, DONALD ([id number]) on 09/18/2014  2:49:19 AM      MDM   Final diagnoses:  Alcohol intoxication, uncomplicated    Filed Vitals:   09/18/14 0524  BP: 135/85  Pulse: 94  Temp:   Resp:     Patient will need to be monitored until more alert and can be re-assessed. Potassium replacement will also need to be given once patient is more awake. No EKG abnormalities. Patient d/w with Dr. Christy Gentles, agrees with plan.    Will sign patient out to Harriet [place]te, PA-C for reevaluation once patient is more awake. CT scans also pending  Baron Sane, PA-C 09/18/14 3244  Ripley Fraise, MD 09/19/14 [phone]

## 2014-09-20 ENCOUNTER — Emergency Department (HOSPITAL_COMMUNITY): Payer: Medicare Other

## 2014-09-20 ENCOUNTER — Inpatient Hospital Stay (HOSPITAL_COMMUNITY): Payer: Medicare Other

## 2014-09-20 ENCOUNTER — Encounter (HOSPITAL_COMMUNITY): Payer: Self-pay | Admitting: Emergency Medicine

## 2014-09-20 ENCOUNTER — Inpatient Hospital Stay (HOSPITAL_COMMUNITY)
Admission: EM | Admit: 2014-09-20 | Discharge: 2014-09-22 | DRG: 682 | Disposition: A | Discharge status: Home / Self Care | Payer: Medicare Other | Attending: Internal Medicine | Admitting: Internal Medicine

## 2014-09-20 DIAGNOSIS — Z853 Personal history of malignant neoplasm of breast: Secondary | ICD-10-CM | POA: Diagnosis not present

## 2014-09-20 DIAGNOSIS — Z862 Personal history of diseases of the blood and blood-forming organs and certain disorders involving the immune mechanism: Secondary | ICD-10-CM | POA: Diagnosis not present

## 2014-09-20 DIAGNOSIS — N178 Other acute kidney failure: Secondary | ICD-10-CM | POA: Diagnosis not present

## 2014-09-20 DIAGNOSIS — E860 Dehydration: Secondary | ICD-10-CM | POA: Diagnosis present

## 2014-09-20 DIAGNOSIS — E876 Hypokalemia: Secondary | ICD-10-CM | POA: Diagnosis present

## 2014-09-20 DIAGNOSIS — R531 Weakness: Secondary | ICD-10-CM | POA: Diagnosis not present

## 2014-09-20 DIAGNOSIS — J45909 Unspecified asthma, uncomplicated: Secondary | ICD-10-CM | POA: Diagnosis present

## 2014-09-20 DIAGNOSIS — Z9981 Dependence on supplemental oxygen: Secondary | ICD-10-CM | POA: Diagnosis not present

## 2014-09-20 DIAGNOSIS — K219 Gastro-esophageal reflux disease without esophagitis: Secondary | ICD-10-CM | POA: Diagnosis present

## 2014-09-20 DIAGNOSIS — Z9181 History of falling: Secondary | ICD-10-CM | POA: Diagnosis not present

## 2014-09-20 DIAGNOSIS — G9340 Encephalopathy, unspecified: Secondary | ICD-10-CM | POA: Diagnosis not present

## 2014-09-20 DIAGNOSIS — Z880 Allergy status to penicillin: Secondary | ICD-10-CM | POA: Diagnosis not present

## 2014-09-20 DIAGNOSIS — G4733 Obstructive sleep apnea (adult) (pediatric): Secondary | ICD-10-CM | POA: Diagnosis present

## 2014-09-20 DIAGNOSIS — D649 Anemia, unspecified: Secondary | ICD-10-CM | POA: Diagnosis not present

## 2014-09-20 DIAGNOSIS — E872 Acidosis, unspecified: Secondary | ICD-10-CM

## 2014-09-20 DIAGNOSIS — C50412 Malignant neoplasm of upper-outer quadrant of left female breast: Secondary | ICD-10-CM | POA: Diagnosis not present

## 2014-09-20 DIAGNOSIS — R4182 Altered mental status, unspecified: Secondary | ICD-10-CM | POA: Diagnosis not present

## 2014-09-20 DIAGNOSIS — Z79899 Other long term (current) drug therapy: Secondary | ICD-10-CM | POA: Diagnosis not present

## 2014-09-20 DIAGNOSIS — N179 Acute kidney failure, unspecified: Principal | ICD-10-CM

## 2014-09-20 DIAGNOSIS — Z5111 Encounter for antineoplastic chemotherapy: Secondary | ICD-10-CM | POA: Diagnosis not present

## 2014-09-20 DIAGNOSIS — I10 Essential (primary) hypertension: Secondary | ICD-10-CM | POA: Diagnosis not present

## 2014-09-20 DIAGNOSIS — R0902 Hypoxemia: Secondary | ICD-10-CM | POA: Diagnosis not present

## 2014-09-20 DIAGNOSIS — R410 Disorientation, unspecified: Secondary | ICD-10-CM | POA: Diagnosis present

## 2014-09-20 DIAGNOSIS — Z452 Encounter for adjustment and management of vascular access device: Secondary | ICD-10-CM

## 2014-09-20 DIAGNOSIS — R404 Transient alteration of awareness: Secondary | ICD-10-CM | POA: Diagnosis not present

## 2014-09-20 DIAGNOSIS — S3992XA Unspecified injury of lower back, initial encounter: Secondary | ICD-10-CM | POA: Diagnosis present

## 2014-09-20 DIAGNOSIS — Z7951 Long term (current) use of inhaled steroids: Secondary | ICD-10-CM | POA: Diagnosis not present

## 2014-09-20 DIAGNOSIS — J9811 Atelectasis: Secondary | ICD-10-CM | POA: Diagnosis not present

## 2014-09-20 DIAGNOSIS — I5032 Chronic diastolic (congestive) heart failure: Secondary | ICD-10-CM | POA: Diagnosis present

## 2014-09-20 DIAGNOSIS — F10120 Alcohol abuse with intoxication, uncomplicated: Secondary | ICD-10-CM | POA: Diagnosis present

## 2014-09-20 DIAGNOSIS — Z3202 Encounter for pregnancy test, result negative: Secondary | ICD-10-CM | POA: Diagnosis present

## 2014-09-20 DIAGNOSIS — R9431 Abnormal electrocardiogram [ECG] [EKG]: Secondary | ICD-10-CM | POA: Diagnosis not present

## 2014-09-20 LAB — URINALYSIS, ROUTINE W REFLEX MICROSCOPIC
BILIRUBIN URINE: NEGATIVE
GLUCOSE, UA: NEGATIVE mg/dL
Hgb urine dipstick: NEGATIVE
Ketones, ur: NEGATIVE mg/dL
LEUKOCYTES UA: NEGATIVE
Nitrite: NEGATIVE
PH: 5 (ref 5.0–8.0)
Protein, ur: NEGATIVE mg/dL
Specific Gravity, Urine: 1.013 (ref 1.005–1.030)
Urobilinogen, UA: 0.2 mg/dL (ref 0.0–1.0)

## 2014-09-20 LAB — CBC WITH DIFFERENTIAL/PLATELET
Basophils Absolute: 0 10*3/uL (ref 0.0–0.1)
Basophils Relative: 0 % (ref 0–1)
Eosinophils Absolute: 0.1 10*3/uL (ref 0.0–0.7)
Eosinophils Relative: 1 % (ref 0–5)
HEMATOCRIT: 37.7 % (ref 36.0–46.0)
HEMOGLOBIN: 11.9 g/dL — AB (ref 12.0–15.0)
Lymphocytes Relative: 22 % (ref 12–46)
Lymphs Abs: 2 10*3/uL (ref 0.7–4.0)
MCH: 29.1 pg (ref 26.0–34.0)
MCHC: 31.6 g/dL (ref 30.0–36.0)
MCV: 92.2 fL (ref 78.0–100.0)
MONOS PCT: 8 % (ref 3–12)
Monocytes Absolute: 0.7 10*3/uL (ref 0.1–1.0)
NEUTROS ABS: 6.3 10*3/uL (ref 1.7–7.7)
Neutrophils Relative %: 69 % (ref 43–77)
Platelets: 231 10*3/uL (ref 150–400)
RBC: 4.09 MIL/uL (ref 3.87–5.11)
RDW: 14.2 % (ref 11.5–15.5)
WBC: 9.2 10*3/uL (ref 4.0–10.5)

## 2014-09-20 LAB — BLOOD GAS, ARTERIAL
Acid-base deficit: 12.9 mmol/L — ABNORMAL HIGH (ref 0.0–2.0)
BICARBONATE: 14.4 meq/L — AB (ref 20.0–24.0)
Drawn by: 103701
FIO2: 0.21 %
O2 Saturation: 94.3 %
PATIENT TEMPERATURE: 98.6
PH ART: 7.186 — AB (ref 7.350–7.450)
PO2 ART: 89.9 mmHg (ref 80.0–100.0)
TCO2: 13.8 mmol/L (ref 0–100)
pCO2 arterial: 39.7 mmHg (ref 35.0–45.0)

## 2014-09-20 LAB — COMPREHENSIVE METABOLIC PANEL
ALT: 27 U/L (ref 14–54)
AST: 28 U/L (ref 15–41)
Albumin: 3.6 g/dL (ref 3.5–5.0)
Alkaline Phosphatase: 53 U/L (ref 38–126)
Anion gap: 9 (ref 5–15)
BUN: 34 mg/dL — ABNORMAL HIGH (ref 6–20)
CO2: 18 mmol/L — AB (ref 22–32)
Calcium: 8.6 mg/dL — ABNORMAL LOW (ref 8.9–10.3)
Chloride: 110 mmol/L (ref 101–111)
Creatinine, Ser: 4.27 mg/dL — ABNORMAL HIGH (ref 0.44–1.00)
GFR calc Af Amer: 14 mL/min — ABNORMAL LOW (ref >60)
GFR calc non Af Amer: 12 mL/min — ABNORMAL LOW (ref >60)
GLUCOSE: 107 mg/dL — AB (ref 65–99)
Potassium: 4.2 mmol/L (ref 3.5–5.1)
SODIUM: 137 mmol/L (ref 135–145)
Total Bilirubin: 0.5 mg/dL (ref 0.3–1.2)
Total Protein: 6.9 g/dL (ref 6.5–8.1)

## 2014-09-20 LAB — BETA-HYDROXYBUTYRIC ACID: Beta-Hydroxybutyric Acid: 0.09 mmol/L (ref 0.05–0.27)

## 2014-09-20 LAB — SALICYLATE LEVEL: Salicylate Lvl: <4 mg/dL (ref 2.8–30.0)

## 2014-09-20 LAB — RAPID URINE DRUG SCREEN, HOSP PERFORMED
AMPHETAMINES: NOT DETECTED
BENZODIAZEPINES: NOT DETECTED
Barbiturates: NOT DETECTED
Cocaine: NOT DETECTED
OPIATES: NOT DETECTED
Tetrahydrocannabinol: NOT DETECTED

## 2014-09-20 LAB — ACETAMINOPHEN LEVEL: Acetaminophen (Tylenol), Serum: <10 ug/mL — ABNORMAL LOW (ref 10–30)

## 2014-09-20 LAB — BASIC METABOLIC PANEL
ANION GAP: 13 (ref 5–15)
BUN: 27 mg/dL — ABNORMAL HIGH (ref 6–20)
CHLORIDE: 108 mmol/L (ref 101–111)
CO2: 19 mmol/L — AB (ref 22–32)
Calcium: 8.6 mg/dL — ABNORMAL LOW (ref 8.9–10.3)
Creatinine, Ser: 2.64 mg/dL — ABNORMAL HIGH (ref 0.44–1.00)
GFR calc Af Amer: 25 mL/min — ABNORMAL LOW (ref >60)
GFR calc non Af Amer: 21 mL/min — ABNORMAL LOW (ref >60)
Glucose, Bld: 93 mg/dL (ref 65–99)
POTASSIUM: 3.6 mmol/L (ref 3.5–5.1)
SODIUM: 140 mmol/L (ref 135–145)

## 2014-09-20 LAB — VITAMIN B12: VITAMIN B 12: 414 pg/mL (ref 180–914)

## 2014-09-20 LAB — CBC
HCT: 38.8 % (ref 36.0–46.0)
HEMOGLOBIN: 13.1 g/dL (ref 12.0–15.0)
MCH: 30 pg (ref 26.0–34.0)
MCHC: 33.8 g/dL (ref 30.0–36.0)
MCV: 88.8 fL (ref 78.0–100.0)
Platelets: 208 10*3/uL (ref 150–400)
RBC: 4.37 MIL/uL (ref 3.87–5.11)
RDW: 14.1 % (ref 11.5–15.5)
WBC: 8.6 10*3/uL (ref 4.0–10.5)

## 2014-09-20 LAB — I-STAT TROPONIN, ED: Troponin i, poc: 0.03 ng/mL (ref 0.00–0.08)

## 2014-09-20 LAB — LACTIC ACID, PLASMA: Lactic Acid, Venous: 0.7 mmol/L (ref 0.5–2.0)

## 2014-09-20 LAB — I-STAT BETA HCG BLOOD, ED (MC, WL, AP ONLY): I-stat hCG, quantitative: <5 m[IU]/mL (ref <5)

## 2014-09-20 LAB — AMMONIA: Ammonia: 25 umol/L (ref 9–35)

## 2014-09-20 LAB — CREATININE, URINE, RANDOM: Creatinine, Urine: 107.5 mg/dL

## 2014-09-20 LAB — TSH: TSH: 0.57 u[IU]/mL (ref 0.350–4.500)

## 2014-09-20 LAB — NA AND K (SODIUM & POTASSIUM), RAND UR
Potassium Urine: 38 mmol/L
SODIUM UR: 117 mmol/L

## 2014-09-20 LAB — ETHANOL: Alcohol, Ethyl (B): <5 mg/dL (ref <5)

## 2014-09-20 LAB — CORTISOL: Cortisol, Plasma: 4.5 ug/dL

## 2014-09-20 LAB — CHLORIDE, URINE, RANDOM: Chloride Urine: 118 mmol/L

## 2014-09-20 LAB — GLUCOSE, CAPILLARY: GLUCOSE-CAPILLARY: 95 mg/dL (ref 65–99)

## 2014-09-20 LAB — MRSA PCR SCREENING: MRSA BY PCR: NEGATIVE

## 2014-09-20 LAB — I-STAT CG4 LACTIC ACID, ED: Lactic Acid, Venous: 0.51 mmol/L (ref 0.5–2.0)

## 2014-09-20 LAB — CK: CK TOTAL: 2673 U/L — AB (ref 38–234)

## 2014-09-20 MED ORDER — MOMETASONE FURO-FORMOTEROL FUM 200-5 MCG/ACT IN AERO
2.0000 | INHALATION_SPRAY | Freq: Two times a day (BID) | RESPIRATORY_TRACT | Status: DC
  Administered 2014-09-20 – 2014-09-22 (×4): 2 via RESPIRATORY_TRACT
  Filled 2014-09-20 (×2): qty 8.8

## 2014-09-20 MED ORDER — LORAZEPAM 2 MG/ML IJ SOLN
INTRAMUSCULAR | Status: AC
  Administered 2014-09-20: 23:00:00
  Filled 2014-09-20: qty 1

## 2014-09-20 MED ORDER — SODIUM CHLORIDE 0.9 % IV SOLN: 250.0000 mL | INTRAVENOUS | Status: DC | PRN

## 2014-09-20 MED ORDER — ISOSORBIDE MONONITRATE ER 60 MG PO TB24
60.0000 mg | ORAL_TABLET | Freq: Every day | ORAL | Status: DC
  Administered 2014-09-21: 60 mg via ORAL
  Filled 2014-09-20 (×2): qty 1

## 2014-09-20 MED ORDER — SODIUM BICARBONATE 8.4 % IV SOLN
INTRAVENOUS | Status: DC
  Administered 2014-09-20: 18:00:00 via INTRAVENOUS
  Filled 2014-09-20 (×2): qty 150

## 2014-09-20 MED ORDER — CLONIDINE HCL 0.1 MG PO TABS
0.1000 mg | ORAL_TABLET | Freq: Two times a day (BID) | ORAL | Status: DC
  Administered 2014-09-21 – 2014-09-22 (×3): 0.1 mg via ORAL
  Filled 2014-09-20 (×5): qty 1

## 2014-09-20 MED ORDER — THIAMINE HCL 100 MG/ML IJ SOLN
100.0000 mg | Freq: Once | INTRAMUSCULAR | Status: AC
  Administered 2014-09-20: 100 mg via INTRAVENOUS
  Filled 2014-09-20: qty 2

## 2014-09-20 MED ORDER — HEPARIN SODIUM (PORCINE) 1000 UNIT/ML DIALYSIS
1000.0000 [IU] | INTRAMUSCULAR | Status: DC | PRN
  Filled 2014-09-20: qty 6

## 2014-09-20 MED ORDER — PRISMASOL BGK 4/2.5 32-4-2.5 MEQ/L IV SOLN
INTRAVENOUS | Status: DC
  Filled 2014-09-20 (×11): qty 5000

## 2014-09-20 MED ORDER — SODIUM CHLORIDE 0.9 % IV BOLUS (SEPSIS)
1000.0000 mL | Freq: Once | INTRAVENOUS | Status: AC
  Administered 2014-09-20: 1000 mL via INTRAVENOUS

## 2014-09-20 MED ORDER — SODIUM CHLORIDE 0.9 % IV SOLN
15.0000 mg/kg | Freq: Once | INTRAVENOUS | Status: AC
  Administered 2014-09-20: 1350 mg via INTRAVENOUS
  Filled 2014-09-20: qty 1.35

## 2014-09-20 MED ORDER — INSULIN ASPART 100 UNIT/ML ~~LOC~~ SOLN: 0.0000 [IU] | SUBCUTANEOUS | Status: DC

## 2014-09-20 MED ORDER — PYRIDOXINE HCL 100 MG/ML IJ SOLN
100.0000 mg | Freq: Once | INTRAMUSCULAR | Status: AC
  Administered 2014-09-20: 100 mg via INTRAVENOUS
  Filled 2014-09-20: qty 1

## 2014-09-20 MED ORDER — SODIUM BICARBONATE 8.4 % IV SOLN
50.0000 meq | Freq: Once | INTRAVENOUS | Status: AC
  Administered 2014-09-20: 50 meq via INTRAVENOUS
  Filled 2014-09-20: qty 50

## 2014-09-20 MED ORDER — PRISMASOL BGK 4/2.5 32-4-2.5 MEQ/L IV SOLN
INTRAVENOUS | Status: DC
  Filled 2014-09-20 (×2): qty 5000

## 2014-09-20 MED ORDER — SODIUM CHLORIDE 0.9 % FOR CRRT
INTRAVENOUS_CENTRAL | Status: DC | PRN
  Filled 2014-09-20: qty 1000

## 2014-09-20 MED ORDER — DILTIAZEM HCL ER COATED BEADS 240 MG PO CP24
240.0000 mg | ORAL_CAPSULE | Freq: Every day | ORAL | Status: DC
  Administered 2014-09-21 – 2014-09-22 (×2): 240 mg via ORAL
  Filled 2014-09-20 (×2): qty 1

## 2014-09-20 MED ORDER — LORAZEPAM 2 MG/ML IJ SOLN
2.0000 mg | Freq: Once | INTRAMUSCULAR | Status: AC
  Administered 2014-09-20: 2 mg via INTRAVENOUS

## 2014-09-20 MED ORDER — HEPARIN SODIUM (PORCINE) 5000 UNIT/ML IJ SOLN
5000.0000 [IU] | Freq: Three times a day (TID) | INTRAMUSCULAR | Status: DC
  Administered 2014-09-20 – 2014-09-22 (×5): 5000 [IU] via SUBCUTANEOUS
  Filled 2014-09-20 (×8): qty 1

## 2014-09-20 MED ORDER — DEXTROSE 50 % IV SOLN: 50.0000 mL | Freq: Once | INTRAVENOUS | Status: DC

## 2014-09-20 NOTE — ED Provider Notes (Signed)
CSN: [id number]     Arrival date & time 09/20/14  1450 History   First MD Initiated Contact with Patient 09/20/14 1523     Chief Complaint  Patient presents with  . Altered Mental Status     (Consider location/radiation/quality/duration/timing/severity/associated sxs/prior Treatment) The history is provided by the patient.  Erica [name] is a 39 y.o. female hx of asthma, HTN, GERD, breast cancer here presenting with altered mental status. Patient was seen in the ER 2 days ago. At that time, patient was thought to have alcohol intoxication. EtOH was 150. She also has ABG with a pH 7.25 with bicarbonate 16. Patient was thought to be dehydrated and given several liters of fluid. EMS was called and found patient lethargic and incontinent. Patient was emotionally upset en route and was crying. Patient refuses to answer any questions on my exam.   Level V caveat- AMS    Past Medical History  Diagnosis Date  . Asthma   . Hypertension   . GERD (gastroesophageal reflux disease)   . Impaired glucose tolerance 04/13/2011  . Anemia, unspecified 04/13/2011  . Asthma 09/27/2010  . Bloating 03/18/2011  . 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 Lymphadenitis, chronic     restricted LEFT extremity  . Dyspnea on exertion     a. 01/2013 Lexi MV: EF 54%, no ischemia/infarct;  b. 05/2013 Echo: EF 60-65%, no rwma, Gr 2 DD;  b.   . OSA (obstructive sleep apnea) 05/04/2014    AHI 31/hr now on CPAP at 12cm H2O  . Breast cancer 2010    a. locally advanced left breast carcinoma diagnosed in 2010 and treated with neoadjuvant chemotherapy with docetaxel and cyclophosphamide as well as paclitaxel. This was followed by radiation therapy which was completed in 2011.  . Fatty liver   . Gastroparesis    Past Surgical History  Procedure Laterality Date  . Hand surgery  2012    right; tendon release  . Breast lumpectomy  09/2008    left  . Carpal tunnel release Left 2011  . Tubal ligation  05/11/2011    Procedure: POST  PARTUM TUBAL LIGATION;  Surgeon: Emeterio Reeve, MD;  Location: [facility];  Service: Gynecology;  Laterality: Bilateral;  Induced for HTN  . Lymph node dissection  2010    left breast; 2 wks after breast lumpectomy   Family History  Problem Relation Age of Onset  . Diabetes Maternal Grandmother   . Heart disease Maternal Grandmother   . Hypertension Maternal Grandmother   . Cancer Neg Hx   . Alcohol abuse Neg Hx   . Early death Neg Hx   . Hyperlipidemia Neg Hx   . Kidney disease Neg Hx   . Stroke Neg Hx   . Colon cancer Neg Hx    History  Substance Use Topics  . Smoking status: Never Smoker   . Smokeless tobacco: Never Used  . Alcohol Use: 0.0 oz/week     Comment: occasionally   OB History    Gravida Para Term Preterm AB TAB SAB Ectopic Multiple Living   5 4 4  1  1   4      Review of Systems  Unable to perform ROS: Mental status change      Allergies  Aspirin; Doxycycline; Penicillins; Kiwi extract; and Strawberry  Home Medications   Prior to Admission medications   Medication Sig Start Date End Date Taking? Authorizing Provider  acetaZOLAMIDE (DIAMOX) 250 MG tablet Take 4 tablets (1,000 mg total)  by mouth 3 (three) times daily. 08/19/14   Donika Keith Rake, DO  albuterol (PROVENTIL HFA;VENTOLIN HFA) 108 (90 BASE) MCG/ACT inhaler Inhale 2 puffs into the lungs every 4 (four) hours as needed for wheezing or shortness of breath (((PLAN A))). 08/30/14   Tammy S Parrett, NP  albuterol (PROVENTIL) (2.5 MG/3ML) 0.083% nebulizer solution Take 3 mLs (2.5 mg total) by nebulization every 4 (four) hours as needed for wheezing or shortness of breath (((PLAN B))). 01/26/14   Tammy S Parrett, NP  amLODipine-olmesartan (AZOR) 10-40 MG per tablet Take 1 tablet by mouth daily.    Historical Provider, MD  Biotin 10 MG TABS Take 1 tablet by mouth daily.    Historical Provider, MD  chlorpheniramine (CHLOR-TRIMETON) 4 MG tablet 2 tabs by mouth at bedtime    Historical Provider, MD  cloNIDine  (CATAPRES) 0.3 MG tablet Take 1 tablet (0.3 mg total) by mouth 2 (two) times daily. 08/24/13   [name], MD  dextromethorphan-guaiFENesin [facility] DM) 30-600 MG per 12 hr tablet Take 1 tablet by mouth every 12 (twelve) hours as needed for cough.    Historical Provider, MD  diltiazem (CARDIZEM CD) 240 MG 24 hr capsule Take 240 mg by mouth daily.    Historical Provider, MD  EPINEPHrine 0.3 mg/0.3 mL IJ SOAJ injection Inject 0.3 mLs (0.3 mg total) into the muscle once. 01/28/14   [name], MD  furosemide (LASIX) 40 MG tablet Take 1 tablet (40 mg total) by mouth daily as needed. 03/14/14   [name], MD  hyoscyamine (LEVSIN SL) 0.125 MG SL tablet Place 1 tablet (0.125 mg total) under the tongue every 8 (eight) hours as needed for cramping. 08/17/14   Jerene Bears, MD  isosorbide mononitrate (IMDUR) 60 MG 24 hr tablet Take 1 tablet (60 mg total) by mouth daily. 02/08/14   [name], MD  LORazepam (ATIVAN) 0.5 MG tablet TAKE 1 TABLET BY MOUTH EVERY 8 HOURS AS NEEDED FOR ANXIETY 09/01/14   Chauncey Cruel, MD  metoCLOPramide (REGLAN) 10 MG tablet TAKE 1 TABLET BY MOUTH FOUR TIMES DAILY WITH MEALS AND AT BEDTIME 09/12/14   Jerene Bears, MD  mometasone (NASONEX) 50 MCG/ACT nasal spray Place 2 sprays into the nose 2 (two) times daily.    Historical Provider, MD  mometasone-formoterol (DULERA) 200-5 MCG/ACT AERO Inhale 2 puffs into the lungs 2 (two) times daily.    Historical Provider, MD  montelukast (SINGULAIR) 10 MG tablet Take 1 tablet (10 mg total) by mouth at bedtime. 01/26/14   Tammy S Parrett, NP  nebivolol (BYSTOLIC) 10 MG tablet Take 1 tablet (10 mg total) by mouth daily. 05/03/14   [name], MD  nitroGLYCERIN (NITROSTAT) 0.4 MG SL tablet Place 1 tablet (0.4 mg total) under the tongue every 5 (five) minutes as needed for chest pain. 08/24/13   [name], MD  ondansetron (ZOFRAN-ODT) 4 MG disintegrating tablet Take 1 tablet (4 mg total) by mouth every 6 (six)  hours as needed for nausea or vomiting. 01/28/14   [name], MD  pantoprazole (PROTONIX) 40 MG tablet Take 1 tablet (40 mg total) by mouth 2 (two) times daily before a meal. 09/02/13   Jerene Bears, MD  potassium chloride SA (K-DUR,KLOR-CON) 20 MEQ tablet Take 20 mEq by mouth 2 (two) times daily.     Historical Provider, MD  pregabalin (LYRICA) 200 MG capsule Take 1 capsule (200 mg total) by mouth 3 (three) times daily. 08/19/14  Alda Berthold, DO  Respiratory Therapy Supplies (FLUTTER) DEVI Use as directed. 04/19/14   Tammy S Parrett, NP  sucralfate (CARAFATE) 1 G tablet Take 1 g by mouth 4 (four) times daily.    Historical Provider, MD  traMADol (ULTRAM) 50 MG tablet 1-2 every 4 hours as needed for cough or pain 08/30/14   Tammy S Parrett, NP   BP 121/98 mmHg  Pulse 40  Temp(Src) 98.5 F (36.9 C) (Oral)  Resp 13  SpO2 96% Physical Exam  Constitutional:  Altered, arousable but sleepy   HENT:  Head: Normocephalic.  Eyes: Conjunctivae are normal. Pupils are equal, round, and reactive to light.  Neck: Normal range of motion. Neck supple.  Cardiovascular: Normal rate, regular rhythm and normal heart sounds.   Pulmonary/Chest: Effort normal.  Diminished throughout   Abdominal: Soft. Bowel sounds are normal. She exhibits no distension. There is no tenderness. There is no rebound and no guarding.  Musculoskeletal: Normal range of motion. She exhibits no edema or tenderness.  Neurological:  Sleepy, arousable. Tearful. Moving all extremities. Not answering questions   Skin: Skin is warm and dry.  Psychiatric:  Unable   Nursing note and vitals reviewed.   ED Course  Procedures (including critical care time)  CRITICAL CARE Performed by: Darl Householder, DAVID   Total critical care time: 30 min   Critical care time was exclusive of separately billable procedures and treating other patients.  Critical care was necessary to treat or prevent imminent or life-threatening  deterioration.  Critical care was time spent personally by me on the following activities: development of treatment plan with patient and/or surrogate as well as nursing, discussions with consultants, evaluation of patient's response to treatment, examination of patient, obtaining history from patient or surrogate, ordering and performing treatments and interventions, ordering and review of laboratory studies, ordering and review of radiographic studies, pulse oximetry and re-evaluation of patient's condition.   Labs Review Labs Reviewed  CBC WITH DIFFERENTIAL/PLATELET - Abnormal; Notable for the following:    Hemoglobin 11.9 (*)    All other components within normal limits  COMPREHENSIVE METABOLIC PANEL - Abnormal; Notable for the following:    CO2 18 (*)    Glucose, Bld 107 (*)    BUN 34 (*)    Creatinine, Ser 4.27 (*)    Calcium 8.6 (*)    GFR calc non Af Amer 12 (*)    GFR calc Af Amer 14 (*)    All other components within normal limits  ACETAMINOPHEN LEVEL - Abnormal; Notable for the following:    Acetaminophen (Tylenol), Serum <10 (*)    All other components within normal limits  URINALYSIS, ROUTINE W REFLEX MICROSCOPIC (NOT AT [facility]) - Abnormal; Notable for the following:    APPearance CLOUDY (*)    All other components within normal limits  BLOOD GAS, ARTERIAL - Abnormal; Notable for the following:    pH, Arterial 7.186 (*)    Bicarbonate 14.4 (*)    Acid-base deficit 12.9 (*)    All other components within normal limits  ETHANOL  SALICYLATE LEVEL  URINE RAPID DRUG SCREEN, HOSP PERFORMED  ETHYLENE GLYCOL  VOLATILES,BLD-ACETONE,ETHANOL,ISOPROP,METHANOL  CK  OSMOLALITY  BETA-HYDROXYBUTYRIC ACID  LACTIC ACID, PLASMA  AMMONIA  I-STAT TROPOININ, ED  I-STAT BETA HCG BLOOD, ED ([facility], [facility], [facility] ONLY)  I-STAT CG4 LACTIC ACID, ED    Imaging Review Ct Head Wo Contrast  09/20/2014   CLINICAL DATA:  39 year old female with altered mental status.  EXAM: CT HEAD WITHOUT  CONTRAST   TECHNIQUE: Contiguous axial images were obtained from the base of the skull through the vertex without intravenous contrast.  COMPARISON:  09/18/2014 head CT and prior exams  FINDINGS: No intracranial abnormalities are identified, including mass lesion or mass effect, hydrocephalus, extra-axial fluid collection, midline shift, hemorrhage, or acute infarction.  The visualized bony calvarium is unremarkable.  IMPRESSION: Unremarkable noncontrast head CT.   Electronically Signed   By: Margarette Canada M.D.   On: 09/20/2014 16:55   Dg Chest Port 1 View  09/20/2014   CLINICAL DATA:  Confusion  EXAM: PORTABLE CHEST - 1 VIEW  COMPARISON:  09/18/2014  FINDINGS: Hypoventilation with decreased lung polyp thin mild left lower lobe atelectasis. No definite pneumonia or heart failure. Cardiac enlargement, partially due to low lung volumes.  IMPRESSION: Hypoventilation with mild left lower lobe atelectasis.   Electronically Signed   By: Franchot Gallo M.D.   On: 09/20/2014 15:49     EKG Interpretation   Date/Time:  Tuesday September 20 2014 15:33:56 EDT Ventricular Rate:  60 PR Interval:  171 QRS Duration: 81 QT Interval:  422 QTC Calculation: 422 R Axis:   63 Text Interpretation:  Atrial-paced complexes Baseline wander in lead(s) V6  No significant change since last tracing Confirmed by YAO  MD, DAVID  ([id number]) on 09/20/2014 3:48:40 PM      MDM   Final diagnoses:  Confusion   Erica [name] is a 39 y.o. female here with AMS. I am concerned for possible overdose including ETOH vs toxic alcohol vs tylenol or salicylates. Also concerned for possible brain bleed but has no signs of head trauma. Finally, can be psychogenic and refusing to answer questions. Will get tox, labs, CT head. Will monitor closely.   4:45 pm ABG showed pH 7.18. Bicarb 14. Concerned for possible overdose from toxic alcohol. Consulted poison control, who recommend starting fomepizole, consult nephrology, give B1 and B6. Consulted Dr. Justin Mend from  nephrology, who wants ICU to place catheter and he will dialyze patient.  6:30 PM CT head showed no bleed. Called Dr. Lake Bells from ICU. He reviewed the chart. Plans on taking her to [facility] to start dialysis and continue fomepizole. Has acute renal failure with Cr 4. Bicab 18, given 1 amp bicarb, started on bicarb drip. Poison control recommended additional labs. Interestingly, anion gap is normal. ETOH neg. No signs of sepsis to explain metabolic acidosis however. Will not start empiric abx. Consider encephalitis as well. Will admit to ICU.   Wandra Arthurs, MD 09/20/14 [phone]

## 2014-09-20 NOTE — Progress Notes (Signed)
Dr. Justin Mend notified of patients new creatinine, and clarification on whether start CRRT tonight. Per Dr. Justin Mend continue to monitor, hold off on CRRT, repeat BMP in the am. Will continue to monitor. Verdie Drown RN BSN

## 2014-09-20 NOTE — ED Notes (Signed)
Protable X-ray at bedside. 

## 2014-09-20 NOTE — ED Notes (Addendum)
In and out done via ED tech x2 with white discharge noted, ?yeast.

## 2014-09-20 NOTE — ED Notes (Signed)
Gave report to [facility] via telephone.

## 2014-09-20 NOTE — Procedures (Signed)
Hemodialysis Catheter Insertion Procedure Note LAVANNA ROG [id number] 1976-03-06  Procedure: Insertion of Hemodialysis Catheter Indications: CRRT  Procedure Details Consent: Risks of procedure as well as the alternatives and risks of each were explained to the (patient/caregiver).  Consent for procedure obtained. Time Out: Verified patient identification, verified procedure, site/side was marked, verified correct patient position, special equipment/implants available, medications/allergies/relevent history reviewed, required imaging and test results available.  Performed  Maximum sterile technique was used including antiseptics, cap, gloves, gown, hand hygiene, mask and sheet. Skin prep: Chlorhexidine; local anesthetic administered A antimicrobial bonded/coated triple lumen catheter was placed in the right internal jugular vein using the Seldinger technique.  Evaluation Blood flow good Complications: No apparent complications Patient did tolerate procedure well. Chest X-ray ordered to verify placement.  CXR: pending.  Procedure performed under direct ultrasound guidance for real time vessel cannulation.      Montey Hora, [facility] Pgr: [phone]  or [phone] 09/20/2014, 11:20 PM

## 2014-09-20 NOTE — Progress Notes (Addendum)
MEDICATION RELATED CONSULT NOTE - INITIAL   Pharmacy Consult for fomepizole Indication: ethylene glycol toxicity  Allergies  Allergen Reactions  . Aspirin Shortness Of Breath and Palpitations    Pt can take ibuprofen without reaction  . Doxycycline Anaphylaxis  . Penicillins Shortness Of Breath and Palpitations  . Kiwi Extract Itching and Swelling    Lip swelling  . Strawberry Hives    Patient Measurements: Weight: 200 lb 6.4 oz (90.9 kg)   Vital Signs: Temp: 99.5 F (37.5 C) (06/21 2030) Temp Source: Oral (06/21 2030) BP: 127/72 mmHg (06/21 2100) Pulse Rate: 73 (06/21 2100) Intake/Output from previous day:   Intake/Output from this shift: Total I/O In: 1250 [I.V.:250; IV Piggyback:1000] Out: -   Labs:  Recent Labs  09/18/14 0315 09/18/14 1029 09/20/14 1549  WBC 7.5  --  9.2  HGB 13.4 16.3* 11.9*  HCT 40.2 48.0* 37.7  PLT 241  --  231  CREATININE 0.93 0.90 4.27*  ALBUMIN  --   --  3.6  PROT  --   --  6.9  AST  --   --  28  ALT  --   --  27  ALKPHOS  --   --  53  BILITOT  --   --  0.5   Estimated Creatinine Clearance: 19.7 mL/min (by C-G formula based on Cr of 4.27).   Microbiology: No results found for this or any previous visit (from the past 720 hour(s)).  Medical History: Past Medical History  Diagnosis Date  . Asthma   . Hypertension   . GERD (gastroesophageal reflux disease)   . Impaired glucose tolerance 04/13/2011  . Anemia, unspecified 04/13/2011  . Asthma 09/27/2010  . Bloating 03/18/2011  . 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 Lymphadenitis, chronic     restricted LEFT extremity  . Dyspnea on exertion     a. 01/2013 Lexi MV: EF 54%, no ischemia/infarct;  b. 05/2013 Echo: EF 60-65%, no rwma, Gr 2 DD;  b.   . OSA (obstructive sleep apnea) 05/04/2014    AHI 31/hr now on CPAP at 12cm H2O  . Breast cancer 2010    a. locally advanced left breast carcinoma diagnosed in 2010 and treated with neoadjuvant chemotherapy with docetaxel and  cyclophosphamide as well as paclitaxel. This was followed by radiation therapy which was completed in 2011.  . Fatty liver   . Gastroparesis    Assessment: 39 y/o female with multiple medical problems admitted on 6/21 from the [facility] ED with acute encephalopathy, metabolic acidosis, and AKI of uncertain etiology.   There is concern for possible antifreeze ingestion/ethylene glycol toxicity. Patient was started on fomepizole in [facility] with 15mg /kg dose given at 1730.   Initial plans were to start CRRT on arrival, those plans are now on hold pending next bmet. Poison control was contacted by [facility] ED[facility], a copy of their protocol was faxed to our main pharmacy to follow.   If patient starts HD/CRRT prior to 2330 will give dose 4 hours after HD started, if starting HD after 2330 will give dose at the beginning of HD session then every 4 hours. If CRRT is not started will continue standard q12 hour regimen.   Goal of Therapy:  Resolution of acidosis Clinical improvement  Plan:  Fomepizole 15mg /kg loading dose given at [facility] Continue fomepizole 10mg /kg q12 hour dosing if CRRT is not started, dose q4h if hemodialysis is started. Follow up ethylene glycol level - send out to [place] PharmD.,  BCPS Clinical Pharmacist Pager [phone] 09/20/2014 10:13 PM    ADDENDUM: Decision has been made to hold off on CRRT for now.  Will continue fomepazole 900mg  IV Q12H x4 doses and monitor for change to CRRT. Wynona Neat, PharmD, BCPS 09/21/2014 2:11 AM

## 2014-09-20 NOTE — Progress Notes (Signed)
[facility] Progress Note Patient Name: LAKETIA VICKNAIR DOB: 09/19/1975 MRN: [id number]   Date of Service  09/20/2014  HPI/Events of Note  Called from ER for admission from [facility]. Has encephalopathy, unexplained metabolic acidosis, was recently evaluated in ED with metabolic acidosis and worsening encephalopathy  eICU Interventions  Continue IV bicarb Check ammonia May need HD Admission order written> go to [facility]     Intervention Category Major Interventions: Change in mental status - evaluation and management  MCQUAID, DOUGLAS 09/20/2014, 6:27 PM

## 2014-09-20 NOTE — ED Notes (Signed)
Resting quietly with eye closed. Arousable to painful stimuli. Verbally responsive but speech is incomprehensible. Resp snoring, even and unlabored. ABC's intact.SR on monitor. Safety sitter at bedside. IV infusing infusing NS at 954ml/hr without difficulty.

## 2014-09-20 NOTE — ED Notes (Signed)
Bed: RESB Expected date:  Expected time:  Means of arrival:  Comments: RM 8

## 2014-09-20 NOTE — ED Notes (Signed)
Pt arrived via EMS with report of pt was found lethargic but responding to pain and incontinent of blowel/bladder. While en-route pt became emotionally upset and crying when talking about her birthday and denies SI/HI. Pt arousable to touch but will answer questions inappropriately. Pt awakens enough to answer a questions then goes back to sleep.

## 2014-09-20 NOTE — ED Notes (Signed)
Pt inquire about DOB and stated her name. Pt reported taking "brown pill" for her blood pressure. Pt asked what brought her to the hosp and stated "who faking sick" repeatedly. Pt with snoring resp. Initated O2 at 3lpm via [place].

## 2014-09-20 NOTE — H&P (Signed)
PULMONARY / CRITICAL CARE MEDICINE   Name: Erica [name] MRN: [id number] DOB: 28-Apr-1975    ADMISSION DATE:  09/20/2014 CONSULTATION DATE:  09/20/14  REFERRING MD :  Darl Householder EDP  CHIEF COMPLAINT:  Altered mental status  INITIAL PRESENTATION: 39 y/o female with multiple medical problems admitted on 6/21 from the [facility] ED with acute encephalopathy, metabolic acidosis, and AKI of uncertain etiology.    STUDIES:  6/21 CT head > NAICP  SIGNIFICANT EVENTS: 6/21 Renal consult> possible ingestion of ethylene glycol, plan CRRT   HISTORY OF PRESENT ILLNESS:  This is a 39 y/o female followed by Dr. Melvyn Novas for Asthma who has a history of multiple medical issues including breast cancer and faty liver who presented to the [facility] by EMS for altered mental status.  Apparently neighbors called for help, but they did not come to the ED to give history.  She was encephalopathic and could not provide a history.  Her children's father came to the ER and stated that she has been in her usual state of health until yesterday evening when she started feeling dizzy.  Aside from that he said she was eating and drinking normally.  He noted that she drank quite a bit of alcohol on the night she came to the ER.  He says that she has not been suicidal and he scoffed at the idea that she would have consumed ethylene glycol.  Further, he said that she does not do drugs.  No further history could be obtained from the patient. 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  has past surgical history that  includes Hand surgery (2012); Breast lumpectomy (09/2008); Carpal tunnel release (Left, 2011); Tubal ligation (05/11/2011); and Lymph node dissection (2010). Prior to Admission medications   Medication Sig Start Date End Date Taking? Authorizing Provider  acetaZOLAMIDE (DIAMOX) 250 MG tablet Take 4 tablets (1,000 mg total) by mouth 3 (three) times daily. 08/19/14   Donika Keith Rake, DO  albuterol (PROVENTIL HFA;VENTOLIN HFA) 108 (90 BASE) MCG/ACT inhaler Inhale 2 puffs into the lungs every 4 (four) hours as needed for wheezing or shortness of breath (((PLAN A))). 08/30/14   Tammy S Parrett, NP  albuterol (PROVENTIL) (2.5 MG/3ML) 0.083% nebulizer solution Take 3 mLs (2.5 mg total) by nebulization every 4 (four) hours as needed for wheezing or shortness of breath (((PLAN B))). 01/26/14   Tammy S Parrett, NP  amLODipine-olmesartan (AZOR) 10-40 MG per tablet Take 1 tablet by mouth daily.    Historical Provider, MD  Biotin 10 MG TABS Take 1 tablet by mouth daily.    Historical Provider, MD  chlorpheniramine (CHLOR-TRIMETON) 4 MG tablet 2 tabs by mouth at bedtime    Historical Provider, MD  cloNIDine (CATAPRES) 0.3 MG tablet Take 1 tablet (0.3 mg total) by mouth 2 (two) times daily. 08/24/13   [name], MD  dextromethorphan-guaiFENesin [facility] DM) 30-600 MG per 12 hr tablet Take 1 tablet by mouth every 12 (twelve) hours as needed for cough.    Historical Provider, MD  diltiazem (CARDIZEM CD) 240 MG 24 hr capsule Take 240 mg by mouth daily.    Historical Provider, MD  EPINEPHrine  0.3 mg/0.3 mL IJ SOAJ injection Inject 0.3 mLs (0.3 mg total) into the muscle once. 01/28/14   [name], MD  furosemide (LASIX) 40 MG tablet Take 1 tablet (40 mg total) by mouth daily as needed. 03/14/14   [name], MD  hyoscyamine (LEVSIN SL) 0.125 MG SL tablet Place 1 tablet (0.125 mg total) under the tongue every 8 (eight) hours as needed for cramping. 08/17/14   Jerene Bears, MD  isosorbide mononitrate (IMDUR) 60  MG 24 hr tablet Take 1 tablet (60 mg total) by mouth daily. 02/08/14   [name], MD  LORazepam (ATIVAN) 0.5 MG tablet TAKE 1 TABLET BY MOUTH EVERY 8 HOURS AS NEEDED FOR ANXIETY 09/01/14   Chauncey Cruel, MD  metoCLOPramide (REGLAN) 10 MG tablet TAKE 1 TABLET BY MOUTH FOUR TIMES DAILY WITH MEALS AND AT BEDTIME 09/12/14   Jerene Bears, MD  mometasone (NASONEX) 50 MCG/ACT nasal spray Place 2 sprays into the nose 2 (two) times daily.    Historical Provider, MD  mometasone-formoterol (DULERA) 200-5 MCG/ACT AERO Inhale 2 puffs into the lungs 2 (two) times daily.    Historical Provider, MD  montelukast (SINGULAIR) 10 MG tablet Take 1 tablet (10 mg total) by mouth at bedtime. 01/26/14   Tammy S Parrett, NP  nebivolol (BYSTOLIC) 10 MG tablet Take 1 tablet (10 mg total) by mouth daily. 05/03/14   [name], MD  nitroGLYCERIN (NITROSTAT) 0.4 MG SL tablet Place 1 tablet (0.4 mg total) under the tongue every 5 (five) minutes as needed for chest pain. 08/24/13   [name], MD  ondansetron (ZOFRAN-ODT) 4 MG disintegrating tablet Take 1 tablet (4 mg total) by mouth every 6 (six) hours as needed for nausea or vomiting. 01/28/14   [name], MD  pantoprazole (PROTONIX) 40 MG tablet Take 1 tablet (40 mg total) by mouth 2 (two) times daily before a meal. 09/02/13   Jerene Bears, MD  potassium chloride SA (K-DUR,KLOR-CON) 20 MEQ tablet Take 20 mEq by mouth 2 (two) times daily.     Historical Provider, MD  pregabalin (LYRICA) 200 MG capsule Take 1 capsule (200 mg total) by mouth 3 (three) times daily. 08/19/14   Alda Berthold, DO  Respiratory Therapy Supplies (FLUTTER) DEVI Use as directed. 04/19/14   Tammy S Parrett, NP  sucralfate (CARAFATE) 1 G tablet Take 1 g by mouth 4 (four) times daily.    Historical Provider, MD  traMADol (ULTRAM) 50 MG tablet 1-2 every 4 hours as needed for cough or pain 08/30/14   Melvenia Needles, NP   Allergies  Allergen Reactions  . Aspirin Shortness Of Breath and  Palpitations    Pt can take ibuprofen without reaction  . Doxycycline Anaphylaxis  . Penicillins Shortness Of Breath and Palpitations  . Kiwi Extract Itching and Swelling    Lip swelling  . Strawberry Hives    FAMILY HISTORY:  indicated that her mother is alive. She indicated that her father is alive. She indicated that her sister is alive. She indicated that her brother is alive.  SOCIAL HISTORY:  reports that she has never smoked. She has never used smokeless tobacco. She reports that she drinks alcohol. She reports that she does not use illicit drugs.  REVIEW OF SYSTEMS:  Cannot obtain due to confusion  SUBJECTIVE:   VITAL SIGNS: Temp:  [98.5 F (36.9 C)] 98.5 F (36.9 C) (06/21 1509) Pulse Rate:  [29-70] 66 (06/21 1854) Resp:  [  11-18] 14 (06/21 1930) BP: (85-136)/(39-98) 136/69 mmHg (06/21 1930) SpO2:  [94 %-100 %] 100 % (06/21 1854) HEMODYNAMICS:   VENTILATOR SETTINGS:   INTAKE / OUTPUT: No intake or output data in the 24 hours ending 09/20/14 2036  PHYSICAL EXAMINATION: General:  Acutely ill, obese,  Neuro:  Confused, int follows commands, non focal, plantars downgoing, int jerks HEENT:  pupils 75mERTL, dry mucosa Cardiovascular:  s1s2 nml Lungs:  Decreased BL, scattered rhonchi Abdomen:  Soft, non tender Musculoskeletal:  Good pulses Skin:  No rash, tattoos  LABS:  CBC  Recent Labs Lab 09/18/14 0315 09/18/14 1029 09/20/14 1549  WBC 7.5  --  9.2  HGB 13.4 16.3* 11.9*  HCT 40.2 48.0* 37.7  PLT 241  --  231   Coag's No results for input(s): APTT, INR in the last 168 hours. BMET  Recent Labs Lab 09/18/14 0315 09/18/14 1029 09/20/14 1549  NA 143 144 137  K 2.9* 3.9 4.2  CL 116* 113* 110  CO2 19*  --  18*  BUN 12 11 34*  CREATININE 0.93 0.90 4.27*  GLUCOSE 100* 98 107*   Electrolytes  Recent Labs Lab 09/18/14 0315 09/20/14 1549  CALCIUM 8.8* 8.6*   Sepsis Markers  Recent Labs Lab 09/20/14 1857 09/20/14 1901  LATICACIDVEN 0.51 0.7    ABG  Recent Labs Lab 09/18/14 0650 09/20/14 1620  PHART 7.251* 7.186*  PCO2ART 37.5 39.7  PO2ART 82.0 89.9   Liver Enzymes  Recent Labs Lab 09/20/14 1549  AST 28  ALT 27  ALKPHOS 53  BILITOT 0.5  ALBUMIN 3.6   Cardiac Enzymes No results for input(s): TROPONINI, PROBNP in the last 168 hours. Glucose  Recent Labs Lab 09/20/14 2030  GLUCAP 95    Imaging Ct Head Wo Contrast  09/20/2014   CLINICAL DATA:  39 year old female with altered mental status.  EXAM: CT HEAD WITHOUT CONTRAST  TECHNIQUE: Contiguous axial images were obtained from the base of the skull through the vertex without intravenous contrast.  COMPARISON:  09/18/2014 head CT and prior exams  FINDINGS: No intracranial abnormalities are identified, including mass lesion or mass effect, hydrocephalus, extra-axial fluid collection, midline shift, hemorrhage, or acute infarction.  The visualized bony calvarium is unremarkable.  IMPRESSION: Unremarkable noncontrast head CT.   Electronically Signed   By: Margarette Canada M.D.   On: 09/20/2014 16:55   Dg Chest Port 1 View  09/20/2014   CLINICAL DATA:  Confusion  EXAM: PORTABLE CHEST - 1 VIEW  COMPARISON:  09/18/2014  FINDINGS: Hypoventilation with decreased lung polyp thin mild left lower lobe atelectasis. No definite pneumonia or heart failure. Cardiac enlargement, partially due to low lung volumes.  IMPRESSION: Hypoventilation with mild left lower lobe atelectasis.   Electronically Signed   By: Franchot Gallo M.D.   On: 09/20/2014 15:49     ASSESSMENT / PLAN:  NEUROLOGIC A:  Acute encephalopathy of uncertain etiology in the setting of alcohol abuse, AKI, non-gap metabolic acidosis.  DDx broad and includes metabolic derrangements (hyperammonemia, ingestion, wilson's disease, b12, folate issues, hypothyroidism, hypoadrenalism) home med related (accidental overdose?). Does not seem like acute neurologic injury or seizures based on non-focal exam.  Possible causes of ingestion  include isopropol alcohol given lack of anion gap or less likely ethylene glycol toxicity.   Poison control notified by EDP, they recommend empiric treatment for ethylene glycol toxicity given severity of illness.  H/o pseudotumor cerebri EtOH abuse P:   Monitor in ICU No sedating medications Check Vit B12, RPR,  TSH, HIV, ceruloplasm Check ethylene glycol level  Check serum osmolarity Continue fomepizole per pharmacy and poison control protocol Place HD cath, plan CRRT Consider EEG, MRI brain Hold home medications with few exceptions Continue Thiamine, folate Monitor glucose  PULMONARY A: No acute issues, aspiration risk P:   Monitor in ICU setting NPO until mental status improves Monitor O2 saturation  CARDIOVASCULAR A: No acute issues History of Hypertension P:  Hold home BP meds with exception of clonidine (low dose, avoid rebound) and diltiazem Tele  RENAL  A:  AKI Non-anion gap metabolic acidosis: ingestion? diamox related?  P:   Renal consult for CRRT in setting of possible ingestion Check Urine anions to measure electrolytes Hold diamox Place HD cath Continue BICARB gtt overnight, but likely can d/c in AM chk serum osm  GASTROINTESTINAL A:  History of fatty liver P:   NPO until mental status improved Monitor LFTs  HEMATOLOGIC A:  Chronic anemia without bleeding P:  Monitor for bleeding  INFECTIOUS A:  No acute issues P:   Monitor for fever  ENDOCRINE A:  History of glucose intolerance P:   Monitor POC SSI Check TSH and cortisol  FAMILY  - Updates: her children's father was updated in ED via [place] by Dr. [place] Bells on 6/21 PM  - Inter-disciplinary family meet or Palliative Care meeting due by: NA    TODAY'S SUMMARY: AKI with acute encephalopathy (out of proportion to uremia) & mild rhabdo, could be simply dehydration with ARB/lasix & diamox on board , less likley toxicity such as ethylene glycol D/w renal  The patient is  critically ill with multiple organ systems failure and requires high complexity decision making for assessment and support, frequent evaluation and titration of therapies, application of advanced monitoring technologies and extensive interpretation of multiple databases. Critical Care Time devoted to patient care services described in this note independent of APP time is 55 minutes.     Kara Mead MD. Shade Flood. [facility] Pager [phone] If no response call [phone]      09/20/2014, 8:36 PM

## 2014-09-20 NOTE — Progress Notes (Signed)
Referring Provider: No ref. provider found Primary Care Physician:  Scarlette Calico, MD Primary Nephrologist:    Reason for Consultation:  Toxic alcohol ingestion with acute renal failure and metabolic acidosis  HPI    hx of asthma, HTN, GERD, breast cancer here presenting with altered mental status. Patient was seen in the ER 2 days ago. At that time, patient was thought to have alcohol intoxication. EtOH was 150.   Past Medical History  Diagnosis Date  . Asthma   . Hypertension   . GERD (gastroesophageal reflux disease)   . Impaired glucose tolerance 04/13/2011  . Anemia, unspecified 04/13/2011  . Asthma 09/27/2010  . Bloating 03/18/2011  . 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 Lymphadenitis, chronic     restricted LEFT extremity  . Dyspnea on exertion     a. 01/2013 Lexi MV: EF 54%, no ischemia/infarct;  b. 05/2013 Echo: EF 60-65%, no rwma, Gr 2 DD;  b.   . OSA (obstructive sleep apnea) 05/04/2014    AHI 31/hr now on CPAP at 12cm H2O  . Breast cancer 2010    a. locally advanced left breast carcinoma diagnosed in 2010 and treated with neoadjuvant chemotherapy with docetaxel and cyclophosphamide as well as paclitaxel. This was followed by radiation therapy which was completed in 2011.  . Fatty liver   . Gastroparesis     Past Surgical History  Procedure Laterality Date  . Hand surgery  2012    right; tendon release  . Breast lumpectomy  09/2008    left  . Carpal tunnel release Left 2011  . Tubal ligation  05/11/2011    Procedure: POST PARTUM TUBAL LIGATION;  Surgeon: Emeterio Reeve, MD;  Location: [facility];  Service: Gynecology;  Laterality: Bilateral;  Induced for HTN  . Lymph node dissection  2010    left breast; 2 wks after breast lumpectomy    Prior to Admission medications   Medication Sig Start Date End Date Taking? Authorizing Provider  acetaZOLAMIDE (DIAMOX) 250 MG tablet Take 4 tablets (1,000 mg total) by mouth 3 (three) times daily. 08/19/14   Donika Keith Rake, DO  albuterol  (PROVENTIL HFA;VENTOLIN HFA) 108 (90 BASE) MCG/ACT inhaler Inhale 2 puffs into the lungs every 4 (four) hours as needed for wheezing or shortness of breath (((PLAN A))). 08/30/14   Tammy S Parrett, NP  albuterol (PROVENTIL) (2.5 MG/3ML) 0.083% nebulizer solution Take 3 mLs (2.5 mg total) by nebulization every 4 (four) hours as needed for wheezing or shortness of breath (((PLAN B))). 01/26/14   Tammy S Parrett, NP  amLODipine-olmesartan (AZOR) 10-40 MG per tablet Take 1 tablet by mouth daily.    Historical Provider, MD  Biotin 10 MG TABS Take 1 tablet by mouth daily.    Historical Provider, MD  chlorpheniramine (CHLOR-TRIMETON) 4 MG tablet 2 tabs by mouth at bedtime    Historical Provider, MD  cloNIDine (CATAPRES) 0.3 MG tablet Take 1 tablet (0.3 mg total) by mouth 2 (two) times daily. 08/24/13   [name], MD  dextromethorphan-guaiFENesin [facility] DM) 30-600 MG per 12 hr tablet Take 1 tablet by mouth every 12 (twelve) hours as needed for cough.    Historical Provider, MD  diltiazem (CARDIZEM CD) 240 MG 24 hr capsule Take 240 mg by mouth daily.    Historical Provider, MD  EPINEPHrine 0.3 mg/0.3 mL IJ SOAJ injection Inject 0.3 mLs (0.3 mg total) into the muscle once. 01/28/14   [name], MD  furosemide (LASIX) 40 MG tablet Take 1  tablet (40 mg total) by mouth daily as needed. 03/14/14   [name], MD  hyoscyamine (LEVSIN SL) 0.125 MG SL tablet Place 1 tablet (0.125 mg total) under the tongue every 8 (eight) hours as needed for cramping. 08/17/14   Jerene Bears, MD  isosorbide mononitrate (IMDUR) 60 MG 24 hr tablet Take 1 tablet (60 mg total) by mouth daily. 02/08/14   [name], MD  LORazepam (ATIVAN) 0.5 MG tablet TAKE 1 TABLET BY MOUTH EVERY 8 HOURS AS NEEDED FOR ANXIETY 09/01/14   Chauncey Cruel, MD  metoCLOPramide (REGLAN) 10 MG tablet TAKE 1 TABLET BY MOUTH FOUR TIMES DAILY WITH MEALS AND AT BEDTIME 09/12/14   Jerene Bears, MD  mometasone (NASONEX) 50 MCG/ACT nasal spray  Place 2 sprays into the nose 2 (two) times daily.    Historical Provider, MD  mometasone-formoterol (DULERA) 200-5 MCG/ACT AERO Inhale 2 puffs into the lungs 2 (two) times daily.    Historical Provider, MD  montelukast (SINGULAIR) 10 MG tablet Take 1 tablet (10 mg total) by mouth at bedtime. 01/26/14   Tammy S Parrett, NP  nebivolol (BYSTOLIC) 10 MG tablet Take 1 tablet (10 mg total) by mouth daily. 05/03/14   [name], MD  nitroGLYCERIN (NITROSTAT) 0.4 MG SL tablet Place 1 tablet (0.4 mg total) under the tongue every 5 (five) minutes as needed for chest pain. 08/24/13   [name], MD  ondansetron (ZOFRAN-ODT) 4 MG disintegrating tablet Take 1 tablet (4 mg total) by mouth every 6 (six) hours as needed for nausea or vomiting. 01/28/14   [name], MD  pantoprazole (PROTONIX) 40 MG tablet Take 1 tablet (40 mg total) by mouth 2 (two) times daily before a meal. 09/02/13   Jerene Bears, MD  potassium chloride SA (K-DUR,KLOR-CON) 20 MEQ tablet Take 20 mEq by mouth 2 (two) times daily.     Historical Provider, MD  pregabalin (LYRICA) 200 MG capsule Take 1 capsule (200 mg total) by mouth 3 (three) times daily. 08/19/14   Alda Berthold, DO  Respiratory Therapy Supplies (FLUTTER) DEVI Use as directed. 04/19/14   Tammy S Parrett, NP  sucralfate (CARAFATE) 1 G tablet Take 1 g by mouth 4 (four) times daily.    Historical Provider, MD  traMADol (ULTRAM) 50 MG tablet 1-2 every 4 hours as needed for cough or pain 08/30/14   Melvenia Needles, NP    Current Facility-Administered Medications  Medication Dose Route Frequency Provider Last Rate Last Dose  . dextrose 50 % solution 50 mL  50 mL Intravenous Once Wandra Arthurs, MD      . sodium bicarbonate 150 mEq in dextrose 5 % 1,000 mL infusion   Intravenous Continuous Wandra Arthurs, MD 125 mL/hr at 09/20/14 1741     Current Outpatient Prescriptions  Medication Sig Dispense Refill  . acetaZOLAMIDE (DIAMOX) 250 MG tablet Take 4 tablets (1,000 mg total) by  mouth 3 (three) times daily. 400 tablet 5  . albuterol (PROVENTIL HFA;VENTOLIN HFA) 108 (90 BASE) MCG/ACT inhaler Inhale 2 puffs into the lungs every 4 (four) hours as needed for wheezing or shortness of breath (((PLAN A))). 18 g 5  . albuterol (PROVENTIL) (2.5 MG/3ML) 0.083% nebulizer solution Take 3 mLs (2.5 mg total) by nebulization every 4 (four) hours as needed for wheezing or shortness of breath (((PLAN B))). 300 mL 5  . amLODipine-olmesartan (AZOR) 10-40 MG per tablet Take 1 tablet by mouth daily.    Marland Kitchen  Biotin 10 MG TABS Take 1 tablet by mouth daily.    . chlorpheniramine (CHLOR-TRIMETON) 4 MG tablet 2 tabs by mouth at bedtime    . cloNIDine (CATAPRES) 0.3 MG tablet Take 1 tablet (0.3 mg total) by mouth 2 (two) times daily. 90 tablet 3  . dextromethorphan-guaiFENesin (MUCINEX DM) 30-600 MG per 12 hr tablet Take 1 tablet by mouth every 12 (twelve) hours as needed for cough.    . diltiazem (CARDIZEM CD) 240 MG 24 hr capsule Take 240 mg by mouth daily.    Marland Kitchen EPINEPHrine 0.3 mg/0.3 mL IJ SOAJ injection Inject 0.3 mLs (0.3 mg total) into the muscle once. 2 Device 1  . furosemide (LASIX) 40 MG tablet Take 1 tablet (40 mg total) by mouth daily as needed. 30 tablet 6  . hyoscyamine (LEVSIN SL) 0.125 MG SL tablet Place 1 tablet (0.125 mg total) under the tongue every 8 (eight) hours as needed for cramping. 30 tablet 2  . isosorbide mononitrate (IMDUR) 60 MG 24 hr tablet Take 1 tablet (60 mg total) by mouth daily. 30 tablet 11  . LORazepam (ATIVAN) 0.5 MG tablet TAKE 1 TABLET BY MOUTH EVERY 8 HOURS AS NEEDED FOR ANXIETY 90 tablet 0  . metoCLOPramide (REGLAN) 10 MG tablet TAKE 1 TABLET BY MOUTH FOUR TIMES DAILY WITH MEALS AND AT BEDTIME 120 tablet 0  . mometasone (NASONEX) 50 MCG/ACT nasal spray Place 2 sprays into the nose 2 (two) times daily.    . mometasone-formoterol (DULERA) 200-5 MCG/ACT AERO Inhale 2 puffs into the lungs 2 (two) times daily.    . montelukast (SINGULAIR) 10 MG tablet Take 1 tablet  (10 mg total) by mouth at bedtime. 30 tablet 5  . nebivolol (BYSTOLIC) 10 MG tablet Take 1 tablet (10 mg total) by mouth daily. 90 tablet 3  . nitroGLYCERIN (NITROSTAT) 0.4 MG SL tablet Place 1 tablet (0.4 mg total) under the tongue every 5 (five) minutes as needed for chest pain. 90 tablet 3  . ondansetron (ZOFRAN-ODT) 4 MG disintegrating tablet Take 1 tablet (4 mg total) by mouth every 6 (six) hours as needed for nausea or vomiting. 20 tablet 5  . pantoprazole (PROTONIX) 40 MG tablet Take 1 tablet (40 mg total) by mouth 2 (two) times daily before a meal. 60 tablet 3  . potassium chloride SA (K-DUR,KLOR-CON) 20 MEQ tablet Take 20 mEq by mouth 2 (two) times daily.     . pregabalin (LYRICA) 200 MG capsule Take 1 capsule (200 mg total) by mouth 3 (three) times daily. 90 capsule 5  . Respiratory Therapy Supplies (FLUTTER) DEVI Use as directed. 1 each 0  . sucralfate (CARAFATE) 1 G tablet Take 1 g by mouth 4 (four) times daily.    . traMADol (ULTRAM) 50 MG tablet 1-2 every 4 hours as needed for cough or pain 30 tablet 0    Allergies as of 09/20/2014 - Review Complete 09/20/2014  Allergen Reaction Noted  . Aspirin Shortness Of Breath and Palpitations 09/27/2010  . Doxycycline Anaphylaxis 01/29/2014  . Penicillins Shortness Of Breath and Palpitations 09/27/2010  . Kiwi extract Itching and Swelling 01/19/2014  . Strawberry Hives 01/19/2014    Family History  Problem Relation Age of Onset  . Diabetes Maternal Grandmother   . Heart disease Maternal Grandmother   . Hypertension Maternal Grandmother   . Cancer Neg Hx   . Alcohol abuse Neg Hx   . Early death Neg Hx   . Hyperlipidemia Neg Hx   . Kidney disease Neg  Hx   . Stroke Neg Hx   . Colon cancer Neg Hx     History   Social History  . Marital Status: Single    Spouse Name: N/A  . Number of Children: 3  . Years of Education: N/A   Occupational History  .     Social History Main Topics  . Smoking status: Never Smoker   .  Smokeless tobacco: Never Used  . Alcohol Use: 0.0 oz/week     Comment: occasionally  . Drug Use: No  . Sexual Activity: Yes    Birth Control/ Protection: Surgical   Other Topics Concern  . Not on file   Social History Narrative   She lives with four children.   She is currently not working (disbaility pending).          Review of Systems: Unable to obtain  Physical Exam: Vital signs in last 24 hours: Temp:  [98.5 F (36.9 C)] 98.5 F (36.9 C) (06/21 1509) Pulse Rate:  [40-70] 66 (06/21 1854) Resp:  [11-18] 12 (06/21 1854) BP: (85-135)/(39-98) 133/94 mmHg (06/21 1854) SpO2:  [94 %-100 %] 100 % (06/21 1854)   General:   Altered but  rousable  Head:  Normocephalic and atraumatic. Eyes:  Sclera clear, no icterus.   Conjunctiva pink. Ears:  Normal auditory acuity. Nose:  No deformity, discharge,  or lesions. Mouth:  No deformity or lesions, dentition normal. Neck:  Supple; no masses or thyromegaly. JVP not elevated Lungs: Diminished  Heart:  Regular rate and rhythm; no murmurs, clicks, rubs,  or gallops. Abdomen:  Soft, nontender and nondistended. No masses, hepatosplenomegaly or hernias noted. Normal bowel sounds, without guarding, and without rebound.   Msk:  Symmetrical without gross deformities. Normal posture. Pulses:  No carotid, renal, femoral bruits. DP and PT symmetrical and equal Extremities:  Without clubbing or edema. Neurologic:  Alert and  oriented x4;  grossly normal neurologically. Skin:  Intact without significant lesions or rashes. Cervical Nodes:  No significant cervical adenopathy. Psych:  Alert and cooperative. Normal mood and affect.  Intake/Output from previous day:   Intake/Output this shift:    Lab Results:  Recent Labs  09/18/14 0315 09/18/14 1029 09/20/14 1549  WBC 7.5  --  9.2  HGB 13.4 16.3* 11.9*  HCT 40.2 48.0* 37.7  PLT 241  --  231   BMET  Recent Labs  09/18/14 0315 09/18/14 1029 09/20/14 1549  NA 143 144 137  K 2.9*  3.9 4.2  CL 116* 113* 110  CO2 19*  --  18*  GLUCOSE 100* 98 107*  BUN 12 11 34*  CREATININE 0.93 0.90 4.27*  CALCIUM 8.8*  --  8.6*   LFT  Recent Labs  09/20/14 1549  PROT 6.9  ALBUMIN 3.6  AST 28  ALT 27  ALKPHOS 53  BILITOT 0.5   PT/INR No results for input(s): LABPROT, INR in the last 72 hours. Hepatitis Panel No results for input(s): HEPBSAG, HCVAB, HEPAIGM, HEPBIGM in the last 72 hours.  Studies/Results: Ct Head Wo Contrast  09/20/2014   CLINICAL DATA:  39 year old female with altered mental status.  EXAM: CT HEAD WITHOUT CONTRAST  TECHNIQUE: Contiguous axial images were obtained from the base of the skull through the vertex without intravenous contrast.  COMPARISON:  09/18/2014 head CT and prior exams  FINDINGS: No intracranial abnormalities are identified, including mass lesion or mass effect, hydrocephalus, extra-axial fluid collection, midline shift, hemorrhage, or acute infarction.  The visualized bony calvarium is unremarkable.  IMPRESSION: Unremarkable noncontrast head CT.   Electronically Signed   By: Margarette Canada M.D.   On: 09/20/2014 16:55   Dg Chest Port 1 View  09/20/2014   CLINICAL DATA:  Confusion  EXAM: PORTABLE CHEST - 1 VIEW  COMPARISON:  09/18/2014  FINDINGS: Hypoventilation with decreased lung polyp thin mild left lower lobe atelectasis. No definite pneumonia or heart failure. Cardiac enlargement, partially due to low lung volumes.  IMPRESSION: Hypoventilation with mild left lower lobe atelectasis.   Electronically Signed   By: Franchot Gallo M.D.   On: 09/20/2014 15:49    Assessment/Plan: Acute renal failure following toxic ingestion would suggest ethylene glycol toxicity. Will check microscopic urinalysis for oxalate crystals. Evaluate for toxic alcohol screen Will initiate patient on CRRT due to low blood pressures  Work up for toxicology screen per CCM  LOS: 0 Ugonna Keirsey W @TODAY @7 :42 PM

## 2014-09-20 NOTE — ED Notes (Signed)
Bed: WA08 Expected date: 09/20/14 Expected time:  Means of arrival: Ambulance Comments: EMS Behavioral issues?

## 2014-09-21 DIAGNOSIS — R0902 Hypoxemia: Secondary | ICD-10-CM

## 2014-09-21 LAB — CBC
HCT: 35.8 % — ABNORMAL LOW (ref 36.0–46.0)
HEMOGLOBIN: 11.8 g/dL — AB (ref 12.0–15.0)
MCH: 28.7 pg (ref 26.0–34.0)
MCHC: 33 g/dL (ref 30.0–36.0)
MCV: 87.1 fL (ref 78.0–100.0)
Platelets: 211 10*3/uL (ref 150–400)
RBC: 4.11 MIL/uL (ref 3.87–5.11)
RDW: 13.9 % (ref 11.5–15.5)
WBC: 6.4 10*3/uL (ref 4.0–10.5)

## 2014-09-21 LAB — OSMOLALITY: OSMOLALITY: 301 mosm/kg — AB (ref 275–300)

## 2014-09-21 LAB — BASIC METABOLIC PANEL
ANION GAP: 9 (ref 5–15)
BUN: 21 mg/dL — ABNORMAL HIGH (ref 6–20)
CALCIUM: 8.3 mg/dL — AB (ref 8.9–10.3)
CHLORIDE: 109 mmol/L (ref 101–111)
CO2: 21 mmol/L — ABNORMAL LOW (ref 22–32)
CREATININE: 1.62 mg/dL — AB (ref 0.44–1.00)
GFR calc Af Amer: 45 mL/min — ABNORMAL LOW (ref >60)
GFR calc non Af Amer: 39 mL/min — ABNORMAL LOW (ref >60)
Glucose, Bld: 89 mg/dL (ref 65–99)
Potassium: 2.9 mmol/L — ABNORMAL LOW (ref 3.5–5.1)
Sodium: 139 mmol/L (ref 135–145)

## 2014-09-21 LAB — HIV ANTIBODY (ROUTINE TESTING W REFLEX): HIV Screen 4th Generation wRfx: NONREACTIVE

## 2014-09-21 LAB — GLUCOSE, CAPILLARY
GLUCOSE-CAPILLARY: 87 mg/dL (ref 65–99)
GLUCOSE-CAPILLARY: 87 mg/dL (ref 65–99)
GLUCOSE-CAPILLARY: 105 mg/dL — AB (ref 65–99)
Glucose-Capillary: 115 mg/dL — ABNORMAL HIGH (ref 65–99)
Glucose-Capillary: 94 mg/dL (ref 65–99)

## 2014-09-21 LAB — VOLATILES,BLD-ACETONE,ETHANOL,ISOPROP,METHANOL
Acetone, blood: NEGATIVE % (ref 0.000–0.010)
ETHANOL, BLOOD: NEGATIVE % (ref 0.000–0.010)
ISOPROPANOL, BLOOD: NEGATIVE % (ref 0.000–0.010)
Methanol, blood: NEGATIVE % (ref 0.000–0.010)

## 2014-09-21 LAB — RPR: RPR Ser Ql: NONREACTIVE

## 2014-09-21 MED ORDER — INSULIN ASPART 100 UNIT/ML ~~LOC~~ SOLN: 0.0000 [IU] | Freq: Three times a day (TID) | SUBCUTANEOUS | Status: DC

## 2014-09-21 MED ORDER — ACETAMINOPHEN 325 MG PO TABS
325.0000 mg | ORAL_TABLET | Freq: Four times a day (QID) | ORAL | Status: DC | PRN
  Administered 2014-09-21 (×2): 325 mg via ORAL
  Filled 2014-09-21 (×3): qty 1

## 2014-09-21 MED ORDER — SODIUM CHLORIDE 0.9 % IV SOLN
900.0000 mg | Freq: Two times a day (BID) | INTRAVENOUS | Status: DC
  Administered 2014-09-21: 900 mg via INTRAVENOUS
  Filled 2014-09-21 (×3): qty 0.9

## 2014-09-21 MED ORDER — POTASSIUM CHLORIDE CRYS ER 20 MEQ PO TBCR
40.0000 meq | EXTENDED_RELEASE_TABLET | Freq: Two times a day (BID) | ORAL | Status: AC
  Administered 2014-09-21 (×2): 40 meq via ORAL
  Filled 2014-09-21 (×2): qty 2

## 2014-09-21 NOTE — Progress Notes (Signed)
[facility] Rounding Note  Subjective:  Awake, alert, says "I would never drink antifreeze or rubbing alcohol - I love my kids" No complaints at this time Excellent UOP 2.5 liters since admission and creatinine falling rapidly w/IV hydration Urine clear No dialytic intervention was required  Objective Vital signs in last 24 hours: Filed Vitals:   09/21/14 0600 09/21/14 0700 09/21/14 0736 09/21/14 0800  BP: 127/65 112/73  128/72  Pulse: 80 82  81  Temp:    98.8 F (37.1 C)  TempSrc:    Oral  Resp: 17 19  15   Weight:      SpO2: 99% 99% 99% 100%   Weight change:   Intake/Output Summary (Last 24 hours) at 09/21/14 0914 Last data filed at 09/21/14 0800  Gross per 24 hour  Intake   1375 ml  Output   3130 ml  Net  -1755 ml    Physical Exam:  BP 128/72 mmHg  Pulse 81  Temp(Src) 98.8 F (37.1 C) (Oral)  Resp 15  Wt 91.9 kg (202 lb 9.6 oz)  SpO2 100% General:Looks well, chipper, alert Neuro: Awake, alert HEENT: [place]/AT Cardiovascular: Regular S1S2 normal No S3 or S4 Lungs: Lungs clear Abdomen: Soft, non tender No edema of LE's  Labs: Basic Metabolic Panel:  Recent Labs Lab 09/18/14 0315 09/18/14 1029 09/20/14 1549 09/20/14 2240 09/21/14 0423  NA 143 144 137 140 139  K 2.9* 3.9 4.2 3.6 2.9*  CL 116* 113* 110 108 109  CO2 19*  --  18* 19* 21*  GLUCOSE 100* 98 107* 93 89  BUN 12 11 34* 27* 21*  CREATININE 0.93 0.90 4.27* 2.64* 1.62*  CALCIUM 8.8*  --  8.6* 8.6* 8.3*     Recent Labs Lab 09/20/14 1549  AST 28  ALT 27  ALKPHOS 53  BILITOT 0.5  PROT 6.9  ALBUMIN 3.6    Recent Labs Lab 09/20/14 1901  AMMONIA 25   CBC:  Recent Labs Lab 09/18/14 0315 09/18/14 1029 09/20/14 1549 09/20/14 2240 09/21/14 0423  WBC 7.5  --  9.2 8.6 6.4  NEUTROABS  --   --  6.3  --   --   HGB 13.4 16.3* 11.9* 13.1 11.8*  HCT 40.2 48.0* 37.7 38.8 35.8*  MCV 89.1  --  92.2 88.8 87.1  PLT 241  --  231 208 211     Recent Labs Lab  09/20/14 1904  CKTOTAL 2673*   CBG:  Recent Labs Lab 09/20/14 2030 09/21/14 0038 09/21/14 0333 09/21/14 0810  GLUCAP 95 94 87 87   Results for WANIA, LONGSTRETH (MRN [id number]) as of 09/21/2014 09:08  Ref. Range 09/20/2014 15:53  Appearance Latest Ref Range: CLEAR  CLOUDY (A)  Bilirubin Urine Latest Ref Range: NEGATIVE  NEGATIVE  Color, Urine Latest Ref Range: YELLOW  YELLOW  Glucose Latest Ref Range: NEGATIVE mg/dL NEGATIVE  Hgb urine dipstick Latest Ref Range: NEGATIVE  NEGATIVE  Ketones, ur Latest Ref Range: NEGATIVE mg/dL NEGATIVE  Leukocytes, UA Latest Ref Range: NEGATIVE  NEGATIVE  Nitrite Latest Ref Range: NEGATIVE  NEGATIVE  pH Latest Ref Range: 5.0-8.0  5.0  Protein Latest Ref Range: NEGATIVE mg/dL NEGATIVE  Specific Gravity, Urine Latest Ref Range: 1.005-1.030  1.013  Urobilinogen, UA Latest Ref Range: 0.0-1.0 mg/dL 0.2   Iron Studies: No results for input(s): IRON, TIBC, TRANSFERRIN, FERRITIN in the last 168 hours. Studies/Results: Ct Head Wo Contrast  09/20/2014   CLINICAL DATA:  39 year old female with altered mental status.  EXAM: CT HEAD WITHOUT CONTRAST  TECHNIQUE: Contiguous axial images were obtained from the base of the skull through the vertex without intravenous contrast.  COMPARISON:  09/18/2014 head CT and prior exams  FINDINGS: No intracranial abnormalities are identified, including mass lesion or mass effect, hydrocephalus, extra-axial fluid collection, midline shift, hemorrhage, or acute infarction.  The visualized bony calvarium is unremarkable.  IMPRESSION: Unremarkable noncontrast head CT.   Electronically Signed   By: Margarette Canada M.D.   On: 09/20/2014 16:55   Dg Chest Port 1 View  09/21/2014   CLINICAL DATA:  Patient status post central line placement.  EXAM: PORTABLE CHEST - 1 VIEW  COMPARISON:  09/20/2014  FINDINGS: Patient rotated to the right. Interval insertion of right internal jugular central venous catheter with tip projecting over the expected  location of the superior vena cava. Stable enlarged cardiac and mediastinal contours. Monitoring leads overlie the patient. Minimal bibasilar heterogeneous opacities.  IMPRESSION: Right IJ central venous catheter tip projects over the expected location of the superior vena cava.   Electronically Signed   By: Lovey Newcomer M.D.   On: 09/21/2014 00:26   Dg Chest Port 1 View  09/20/2014   CLINICAL DATA:  Confusion  EXAM: PORTABLE CHEST - 1 VIEW  COMPARISON:  09/18/2014  FINDINGS: Hypoventilation with decreased lung polyp thin mild left lower lobe atelectasis. No definite pneumonia or heart failure. Cardiac enlargement, partially due to low lung volumes.  IMPRESSION: Hypoventilation with mild left lower lobe atelectasis.   Electronically Signed   By: Franchot Gallo M.D.   On: 09/20/2014 15:49   Medications:   . cloNIDine  0.1 mg Oral BID  . dextrose  50 mL Intravenous Once  . diltiazem  240 mg Oral Daily  . fomepizole (ANTIZOL) IV  900 mg Intravenous Q12H  . heparin  5,000 Units Subcutaneous 3 times per day  . insulin aspart  0-9 Units Subcutaneous 6 times per day  . isosorbide mononitrate  60 mg Oral Daily  . mometasone-formoterol  2 puff Inhalation BID     Assessment/Recommendations  1. AKI - resolving with IVF hydration. Speaks more to a pre-renal process (volume depletion with ARB/diuretic on board). Urinalysis entirely benign and creatinine falling rapidly. Adamantly denies any toxic alcohol ingestion.  No dialytic intervention needed. Would stop fomepizole. Remove HD catheter. 2. Normal gap acidosis - resolving. Off bicarb drip since last night 3. Acute encephalopathy - resolved 4. Hypokalemia - replace 5. HTN - meds per primary service 6. Disposition - given rapid improvement in renal function and no dialysis needs, renal will sign off.    Erica Maes, MD [facility] [phone] pager 09/21/2014, 9:14 AM

## 2014-09-21 NOTE — Care Management Note (Signed)
Case Management Note  Patient Details  Name: Erica [name] MRN: [id number] Date of Birth: Aug 15, 1975  Subjective/Objective:     Pt admitted on 09/20/14 with acute encephalopathy, AKI.  PTA, pt independent, lives with spouse.                 Action/Plan: Will follow for discharge needs as pt progresses.    Expected Discharge Date:   (unknown)               Expected Discharge Plan:  Home/Self Care  In-House Referral:     Discharge planning Services  CM Consult  Post Acute Care Choice:    Choice offered to:     DME Arranged:    DME Agency:     HH Arranged:    HH Agency:     Status of Service:  In process, will continue to follow  Medicare Important Message Given:    Date Medicare IM Given:    Medicare IM give by:    Date Additional Medicare IM Given:    Additional Medicare Important Message give by:     If discussed at [place] of Stay Meetings, dates discussed:    Additional Comments:  Reinaldo Raddle, RN, BSN  Trauma/Neuro ICU Case Manager [phone]

## 2014-09-21 NOTE — Progress Notes (Signed)
PULMONARY / CRITICAL CARE MEDICINE   Name: Erica [name] MRN: [id number] DOB: December 13, 1975    ADMISSION DATE:  09/20/2014 CONSULTATION DATE:  09/20/14  REFERRING MD :  Darl Householder EDP  CHIEF COMPLAINT:  Altered mental status  INITIAL PRESENTATION: 39 y/o female with multiple medical problems admitted on 6/21 from the [facility] ED with acute encephalopathy, metabolic acidosis, and AKI of uncertain etiology.    STUDIES:  6/21 CT head > NAICP  SIGNIFICANT EVENTS: 6/21 Renal consult> possible ingestion of ethylene glycol, plan CRRT  HISTORY OF PRESENT ILLNESS:  This is a 39 y/o female followed by Dr. Melvyn Novas for Asthma who has a history of multiple medical issues including breast cancer and faty liver who presented to the [facility] by EMS for altered mental status.  Apparently neighbors called for help, but they did not come to the ED to give history.  She was encephalopathic and could not provide a history.  Her children's father came to the ER and stated that she has been in her usual state of health until yesterday evening when she started feeling dizzy.  Aside from that he said she was eating and drinking normally.  He noted that she drank quite a bit of alcohol on the night she came to the ER.  He says that she has not been suicidal and he scoffed at the idea that she would have consumed ethylene glycol.  Further, he said that she does not do drugs.  No further history could be obtained from the patient. Seen in ED 6/19 for ETOH intoxication , hydrated & dc'd, nml cr then  SUBJECTIVE: No events overnight, was likely intoxicated.  VITAL SIGNS: Temp:  [98.5 F (36.9 C)-99.7 F (37.6 C)] 98.8 F (37.1 C) (06/22 0800) Pulse Rate:  [29-82] 81 (06/22 0800) Resp:  [10-19] 16 (06/22 0900) BP: (85-155)/(39-105) 145/99 mmHg (06/22 0900) SpO2:  [94 %-100 %] 100 % (06/22 0900) Weight:  [90.9 kg (200 lb 6.4 oz)-91.9 kg (202 lb 9.6 oz)] 91.9 kg (202 lb 9.6 oz) (06/22 0433)   HEMODYNAMICS:     VENTILATOR SETTINGS:      INTAKE / OUTPUT:  Intake/Output Summary (Last 24 hours) at 09/21/14 0943 Last data filed at 09/21/14 0800  Gross per 24 hour  Intake   1375 ml  Output   3130 ml  Net  -1755 ml   PHYSICAL EXAMINATION: General:  Alert and interactive Neuro:  Following commands, moving all ext to command. Head: [place]/AT EENT:  PERRL, EOM-I and MMM Cardiovascular:  RRR, Nl S1/S2, -M/R/G. Lungs:  Decreased BL, scattered rhonchi Abdomen:  Soft, non tender, ND and +BS Musculoskeletal:  Good pulses, -edema and -tenderness. Skin:  No rash, tattoos  LABS:  CBC  Recent Labs Lab 09/20/14 1549 09/20/14 2240 09/21/14 0423  WBC 9.2 8.6 6.4  HGB 11.9* 13.1 11.8*  HCT 37.7 38.8 35.8*  PLT 231 208 211   Coag's No results for input(s): APTT, INR in the last 168 hours. BMET  Recent Labs Lab 09/20/14 1549 09/20/14 2240 09/21/14 0423  NA 137 140 139  K 4.2 3.6 2.9*  CL 110 108 109  CO2 18* 19* 21*  BUN 34* 27* 21*  CREATININE 4.27* 2.64* 1.62*  GLUCOSE 107* 93 89   Electrolytes  Recent Labs Lab 09/20/14 1549 09/20/14 2240 09/21/14 0423  CALCIUM 8.6* 8.6* 8.3*   Sepsis Markers  Recent Labs Lab 09/20/14 1857 09/20/14 1901  LATICACIDVEN 0.51 0.7   ABG  Recent Labs Lab 09/18/14 0650 09/20/14  1620  PHART 7.251* 7.186*  PCO2ART 37.5 39.7  PO2ART 82.0 89.9   Liver Enzymes  Recent Labs Lab 09/20/14 1549  AST 28  ALT 27  ALKPHOS 53  BILITOT 0.5  ALBUMIN 3.6   Cardiac Enzymes No results for input(s): TROPONINI, PROBNP in the last 168 hours. Glucose  Recent Labs Lab 09/20/14 2030 09/21/14 0038 09/21/14 0333 09/21/14 0810  GLUCAP 95 94 87 87   Imaging Ct Head Wo Contrast  09/20/2014   CLINICAL DATA:  39 year old female with altered mental status.  EXAM: CT HEAD WITHOUT CONTRAST  TECHNIQUE: Contiguous axial images were obtained from the base of the skull through the vertex without intravenous contrast.  COMPARISON:  09/18/2014 head CT and prior exams  FINDINGS: No  intracranial abnormalities are identified, including mass lesion or mass effect, hydrocephalus, extra-axial fluid collection, midline shift, hemorrhage, or acute infarction.  The visualized bony calvarium is unremarkable.  IMPRESSION: Unremarkable noncontrast head CT.   Electronically Signed   By: Margarette Canada M.D.   On: 09/20/2014 16:55   Dg Chest Port 1 View  09/21/2014   CLINICAL DATA:  Patient status post central line placement.  EXAM: PORTABLE CHEST - 1 VIEW  COMPARISON:  09/20/2014  FINDINGS: Patient rotated to the right. Interval insertion of right internal jugular central venous catheter with tip projecting over the expected location of the superior vena cava. Stable enlarged cardiac and mediastinal contours. Monitoring leads overlie the patient. Minimal bibasilar heterogeneous opacities.  IMPRESSION: Right IJ central venous catheter tip projects over the expected location of the superior vena cava.   Electronically Signed   By: Lovey Newcomer M.D.   On: 09/21/2014 00:26   Dg Chest Port 1 View  09/20/2014   CLINICAL DATA:  Confusion  EXAM: PORTABLE CHEST - 1 VIEW  COMPARISON:  09/18/2014  FINDINGS: Hypoventilation with decreased lung polyp thin mild left lower lobe atelectasis. No definite pneumonia or heart failure. Cardiac enlargement, partially due to low lung volumes.  IMPRESSION: Hypoventilation with mild left lower lobe atelectasis.   Electronically Signed   By: Franchot Gallo M.D.   On: 09/20/2014 15:49   I reviewed CXR myself, no evidence of acute disease.  ASSESSMENT / PLAN:  NEUROLOGIC A:  Acute encephalopathy of uncertain etiology in the setting of alcohol abuse, AKI, non-gap metabolic acidosis.  DDx broad and includes metabolic derrangements (hyperammonemia, ingestion, wilson's disease, b12, folate issues, hypothyroidism, hypoadrenalism) home med related (accidental overdose?). Does not seem like acute neurologic injury or seizures based on non-focal exam.  Possible causes of ingestion  include isopropol alcohol given lack of anion gap or less likely ethylene glycol toxicity.   Poison control notified by EDP, they recommend empiric treatment for ethylene glycol toxicity given severity of illness.  H/o pseudotumor cerebri EtOH abuse Improving mental status. P:   Transfer to tele. No sedating medications Check Vit B12 WNL, RPR neg, TSH WNL, HIV, ceruloplasm Check ethylene glycol level pending Check serum osmolarity WNL D/C fomepizole. D/C HD catheter. No need for EEG, MRI brain for now Hold home medications with few exceptions Continue Thiamine, folate Monitor glucose  PULMONARY A: No acute issues, aspiration risk P:   Transfer to tele Start diet Monitor O2 saturation  CARDIOVASCULAR A: No acute issues History of Hypertension P:  Hold home BP meds with exception of clonidine (low dose, avoid rebound) and diltiazem Tele  RENAL  A:  AKI Non-anion gap metabolic acidosis: ingestion? diamox related?  P:   Renal consult appreciated, no HD  Hold diamox D/C HD cath D/C BICARB gtt.  GASTROINTESTINAL A:  History of fatty liver P:   Heart healthy diet Monitor LFTs  HEMATOLOGIC A:  Chronic anemia without bleeding P:  Monitor for bleeding  INFECTIOUS A:  No acute issues P:   Monitor for fever  ENDOCRINE A:  History of glucose intolerance P:   Monitor POC SSI  FAMILY  - Updates: Patient updated bedside, will transfer to tele and to [facility] with PCCM off.  - Inter-disciplinary family meet or Palliative Care meeting due by: NA  Rush Farmer, M.D. [facility] Pulmonary/Critical Care Medicine. Pager: [phone]. After hours pager: [phone].   09/21/2014, 9:43 AM

## 2014-09-22 ENCOUNTER — Encounter (HOSPITAL_COMMUNITY): Payer: Self-pay | Admitting: General Practice

## 2014-09-22 ENCOUNTER — Ambulatory Visit (HOSPITAL_BASED_OUTPATIENT_CLINIC_OR_DEPARTMENT_OTHER): Payer: Medicare Other

## 2014-09-22 VITALS — BP 141/78 | HR 79 | Temp 98.5°F

## 2014-09-22 DIAGNOSIS — Z5111 Encounter for antineoplastic chemotherapy: Secondary | ICD-10-CM

## 2014-09-22 DIAGNOSIS — N178 Other acute kidney failure: Secondary | ICD-10-CM

## 2014-09-22 DIAGNOSIS — E876 Hypokalemia: Secondary | ICD-10-CM

## 2014-09-22 DIAGNOSIS — C50412 Malignant neoplasm of upper-outer quadrant of left female breast: Secondary | ICD-10-CM

## 2014-09-22 DIAGNOSIS — I10 Essential (primary) hypertension: Secondary | ICD-10-CM

## 2014-09-22 LAB — RENAL FUNCTION PANEL
Albumin: 3.7 g/dL (ref 3.5–5.0)
Anion gap: 7 (ref 5–15)
BUN: 15 mg/dL (ref 6–20)
CHLORIDE: 111 mmol/L (ref 101–111)
CO2: 23 mmol/L (ref 22–32)
CREATININE: 0.95 mg/dL (ref 0.44–1.00)
Calcium: 9.5 mg/dL (ref 8.9–10.3)
GFR calc Af Amer: >60 mL/min (ref >60)
Glucose, Bld: 109 mg/dL — ABNORMAL HIGH (ref 65–99)
Phosphorus: 2.4 mg/dL — ABNORMAL LOW (ref 2.5–4.6)
Potassium: 3.4 mmol/L — ABNORMAL LOW (ref 3.5–5.1)
Sodium: 141 mmol/L (ref 135–145)

## 2014-09-22 LAB — CBC
HEMATOCRIT: 36.3 % (ref 36.0–46.0)
Hemoglobin: 12.4 g/dL (ref 12.0–15.0)
MCH: 29.5 pg (ref 26.0–34.0)
MCHC: 34.2 g/dL (ref 30.0–36.0)
MCV: 86.2 fL (ref 78.0–100.0)
Platelets: 242 10*3/uL (ref 150–400)
RBC: 4.21 MIL/uL (ref 3.87–5.11)
RDW: 13.9 % (ref 11.5–15.5)
WBC: 8 10*3/uL (ref 4.0–10.5)

## 2014-09-22 LAB — BASIC METABOLIC PANEL
ANION GAP: 11 (ref 5–15)
BUN: 17 mg/dL (ref 6–20)
CALCIUM: 9.3 mg/dL (ref 8.9–10.3)
CO2: 23 mmol/L (ref 22–32)
Chloride: 108 mmol/L (ref 101–111)
Creatinine, Ser: 1.06 mg/dL — ABNORMAL HIGH (ref 0.44–1.00)
GFR calc Af Amer: >60 mL/min (ref >60)
GFR calc non Af Amer: >60 mL/min (ref >60)
Glucose, Bld: 121 mg/dL — ABNORMAL HIGH (ref 65–99)
Potassium: 3.3 mmol/L — ABNORMAL LOW (ref 3.5–5.1)
SODIUM: 142 mmol/L (ref 135–145)

## 2014-09-22 LAB — MAGNESIUM: Magnesium: 2.2 mg/dL (ref 1.7–2.4)

## 2014-09-22 LAB — PHOSPHORUS: PHOSPHORUS: 2.2 mg/dL — AB (ref 2.5–4.6)

## 2014-09-22 LAB — GLUCOSE, CAPILLARY
Glucose-Capillary: 111 mg/dL — ABNORMAL HIGH (ref 65–99)
Glucose-Capillary: 89 mg/dL (ref 65–99)

## 2014-09-22 LAB — ETHYLENE GLYCOL: Ethylene Glycol Lvl: NOT DETECTED mg/dL

## 2014-09-22 LAB — CERULOPLASMIN: CERULOPLASMIN: 37.1 mg/dL (ref 19.0–39.0)

## 2014-09-22 MED ORDER — GOSERELIN ACETATE 3.6 MG ~~LOC~~ IMPL
3.6000 mg | DRUG_IMPLANT | Freq: Once | SUBCUTANEOUS | Status: AC
  Administered 2014-09-22: 3.6 mg via SUBCUTANEOUS
  Filled 2014-09-22: qty 3.6

## 2014-09-22 MED ORDER — POTASSIUM CHLORIDE CRYS ER 20 MEQ PO TBCR: 60.0000 meq | EXTENDED_RELEASE_TABLET | Freq: Once | ORAL | Status: DC

## 2014-09-22 NOTE — Evaluation (Signed)
Physical Therapy Evaluation and D/C  Patient Details Name: Erica [name] MRN: [id number] DOB: 11-12-1975 Today's Date: 09/22/2014   History of Present Illness  39 y/o female with multiple medical problems admitted on 6/21 from the [facility] ED with acute encephalopathy, metabolic acidosis, and AKI of uncertain etiology.   Clinical Impression  Pt admitted with above diagnosis. Pt currently without significant functional limitations and is independent walking up and down hallway.  Pt states she falls a lot but did not see LOB in which pt did not self correct with ambulation in hallway.  Pt requested RW but would not wait for one to be delivered.  Pt has no further skilled PT needs at this time.    Follow Up Recommendations No PT follow up    Equipment Recommendations  Rolling walker with 5" wheels (pt requesting RW but not willing to wait for it to be delive)    Recommendations for Other Services       Precautions / Restrictions Precautions Precautions: Fall Restrictions Weight Bearing Restrictions: No      Mobility  Bed Mobility Overal bed mobility: Independent                Transfers Overall transfer level: Independent                  Ambulation/Gait Ambulation/Gait assistance: Supervision Ambulation Distance (Feet): 150 Feet Assistive device: None Gait Pattern/deviations: Step-to pattern;Decreased stride length;Decreased step length - right;Decreased step length - left;Scissoring;Staggering left;Staggering right;Narrow base of support   Gait velocity interpretation: Below normal speed for age/gender General Gait Details: Pt staggers and scissors gait occasionally.  Can't determine if pt doing on purpose or actually losing balance. Pt states she falls at home and her balance is always off.  She requests a rollator.  Called the CM and the CM said she would most likely not qualify for a rollator but could get a RW.  When pt informed, she states she will call Dr.  [name] and that he will take care of it.    Stairs            Wheelchair Mobility    Modified Rankin (Stroke Patients Only)       Balance Overall balance assessment: Needs assistance;History of Falls         Standing balance support: No upper extremity supported;During functional activity Standing balance-Leahy Scale: Fair Standing balance comment: can stand statically and maintain balance.  Pt would cough and step in all directions appearing to lose balance however pt was able to self correct each time.                               Pertinent Vitals/Pain Pain Assessment: 0-10 Pain Score: 4  Pain Location: right ankle Pain Descriptors / Indicators: Sore Pain Intervention(s): Limited activity within patient's tolerance;Monitored during session;Repositioned;Heat applied  VSS    Home Living Family/patient expects to be discharged to:: Private residence Living Arrangements: Children Available Help at Discharge: Family;Available 24 hours/day Type of Home: Apartment Home Access: Level entry     Home Layout: One level Home Equipment: None      Prior Function Level of Independence: Independent         Comments: Pt states she falls alot.     Hand Dominance        Extremity/Trunk Assessment   Upper Extremity Assessment: Defer to OT evaluation  Lower Extremity Assessment: Generalized weakness      Cervical / Trunk Assessment: Normal  Communication   Communication: No difficulties  Cognition Arousal/Alertness: Awake/alert Behavior During Therapy: WFL for tasks assessed/performed Overall Cognitive Status: Within Functional Limits for tasks assessed                      General Comments General comments (skin integrity, edema, etc.): Pt c/o right ankle pain.  Instructed pt to use heat and ice and alternate for pain.  Also told pt to see MD in 2 weeks if not better.    Exercises        Assessment/Plan    PT  Assessment Patent does not need any further PT services  PT Diagnosis Generalized weakness   PT Problem List    PT Treatment Interventions     PT Goals (Current goals can be found in the Care Plan section) Acute Rehab PT Goals Patient Stated Goal: go home PT Goal Formulation: All assessment and education complete, DC therapy    Frequency     Barriers to discharge        Co-evaluation               End of Session Equipment Utilized During Treatment: Gait belt Activity Tolerance: Patient tolerated treatment well Patient left: in bed;with call bell/phone within reach;Other (comment) (sitting on EOB.) Nurse Communication: Mobility status         Time: 2355-7322 PT Time Calculation (min) (ACUTE ONLY): 13 min   Charges:   PT Evaluation $Initial PT Evaluation Tier I: 1 Procedure     PT G CodesDenice Paradise 09/30/14, 1:19 PM Carleta Woodrow,PT Acute Rehabilitation [phone] (pager)

## 2014-09-22 NOTE — Discharge Summary (Signed)
Physician Discharge Summary  Erica [name] PIR:[id number] DOB: 11/08/1975 DOA: 09/20/2014  PCP: Scarlette Calico, MD  Admit date: 09/20/2014 Discharge date: 09/22/2014  Time spent: 40 minutes  Recommendations for Outpatient Follow-up:  1. Follow-up with primary care physician within one week.  Discharge Diagnoses:  Principal Problem:   Acute renal failure Active Problems:   Malignant hypertension   Hypokalemia   Acute encephalopathy   Discharge Condition: Stable  Diet recommendation: Heart healthy  Filed Weights   09/21/14 0433 09/21/14 2104 09/22/14 0500  Weight: 91.9 kg (202 lb 9.6 oz) 89.54 kg (197 lb 6.4 oz) 89.5 kg (197 lb 5 oz)    History of present illness:  This is a 39 y/o female followed by Dr. Melvyn Novas for Asthma who has a history of multiple medical issues including breast cancer and faty liver who presented to the [facility] by EMS for altered mental status. Apparently neighbors called for help, but they did not come to the ED to give history. She was encephalopathic and could not provide a history. Her children's father came to the ER and stated that she has been in her usual state of health until yesterday evening when she started feeling dizzy. Aside from that he said she was eating and drinking normally. He noted that she drank quite a bit of alcohol on the night she came to the ER. He says that she has not been suicidal and he scoffed at the idea that she would have consumed ethylene glycol. Further, he said that she does not do drugs. No further history could be obtained from the patient. Seen in ED 6/19 for ETOH intoxication , hydrated & dc'd, nml cr then  Hospital Course:   Acute renal failure Patient presented to the hospital with creatinine of 4.29, there was speculation that she ingested antifreeze. Dialysis catheter placed the time of admission and nephrology consulted. Her renal function improved without dialysis, dialysis catheter removed per nephrology. Her  creatinine and urine output improved with IV fluid hydration, creatinine today is 1.0. ARF is likely secondary to dehydration, plus nephrotoxic medications including Diamox, olmesartan and Lasix. Olmesartan and Lasix discontinued time of discharge, Diamox continued as she is taking that for pseudotumor cerebri. Patient reports he was fasting longer taking her regular medications.  Chronic diastolic CHF No evidence of fluid overload, patient received fluids for her acute renal failure. Her urine output improved very well without diuretics "diuretic phase of recovery from acute renal failure". Patient on different medications, amlodipine and olmesartan discontinued. Patient is still on by Bystolic, Cardizem, clonidine and Imdur, and instructed to follow-up with cardiology for adjustment of medications.  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 Repleted with oral supplements.  Hypertension Patient reports malignant hypertension, blood pressure appears to be reasonable, is continued Azor.  Acute encephalopathy Unclear cause of acute encephalopathy, per PCCM the amount of encephalopathy does not correlate with the BUN.  History of pseudotumor cerebri She is on Diamox, continued.  Procedures:  None  Consultations:  Patient was under PCCM, transferred [facility] on 6/22, discharged on 6/23.  Discharge Exam: Filed Vitals:   09/22/14 0500  BP: 119/64  Pulse: 80  Temp: 97.9 F (36.6 C)  Resp: 18   General: Alert and awake, oriented x3, not in any acute distress. HEENT: anicteric sclera, pupils reactive to light and accommodation, EOMI CVS: S1-S2 clear, no murmur rubs or gallops Chest: clear to auscultation bilaterally, no wheezing, rales or rhonchi Abdomen:  soft nontender, nondistended, normal bowel sounds, no organomegaly Extremities: no cyanosis, clubbing or edema noted bilaterally Neuro: Cranial nerves  II-XII intact, no focal neurological deficits  Discharge Instructions   Discharge Instructions    Diet - low sodium heart healthy    Complete by:  As directed      Increase activity slowly    Complete by:  As directed           Current Discharge Medication List    CONTINUE these medications which have NOT CHANGED   Details  Tetrahydrozoline HCl (EYE DROPS OP) Place 1 drop into both eyes daily.    acetaZOLAMIDE (DIAMOX) 250 MG tablet Take 4 tablets (1,000 mg total) by mouth 3 (three) times daily. Qty: 400 tablet, Refills: 5    albuterol (PROVENTIL HFA;VENTOLIN HFA) 108 (90 BASE) MCG/ACT inhaler Inhale 2 puffs into the lungs every 4 (four) hours as needed for wheezing or shortness of breath (((PLAN A))). Qty: 18 g, Refills: 5    albuterol (PROVENTIL) (2.5 MG/3ML) 0.083% nebulizer solution Take 3 mLs (2.5 mg total) by nebulization every 4 (four) hours as needed for wheezing or shortness of breath (((PLAN B))). Qty: 300 mL, Refills: 5    Biotin 10 MG TABS Take 1 tablet by mouth daily.    cloNIDine (CATAPRES) 0.3 MG tablet Take 1 tablet (0.3 mg total) by mouth 2 (two) times daily. Qty: 90 tablet, Refills: 3   Associated Diagnoses: Palpitations; Malignant hypertension; Chronic diastolic CHF (congestive heart failure); Dyspnea    diltiazem (CARDIZEM CD) 240 MG 24 hr capsule Take 240 mg by mouth daily.    EPINEPHrine 0.3 mg/0.3 mL IJ SOAJ injection Inject 0.3 mLs (0.3 mg total) into the muscle once. Qty: 2 Device, Refills: 1    hyoscyamine (LEVSIN SL) 0.125 MG SL tablet Place 1 tablet (0.125 mg total) under the tongue every 8 (eight) hours as needed for cramping. Qty: 30 tablet, Refills: 2    isosorbide mononitrate (IMDUR) 60 MG 24 hr tablet Take 1 tablet (60 mg total) by mouth daily. Qty: 30 tablet, Refills: 11    LORazepam (ATIVAN) 0.5 MG tablet TAKE 1 TABLET BY MOUTH EVERY 8 HOURS AS NEEDED FOR ANXIETY Qty: 90 tablet, Refills: 0    metoCLOPramide (REGLAN) 10 MG tablet TAKE  1 TABLET BY MOUTH FOUR TIMES DAILY WITH MEALS AND AT BEDTIME Qty: 120 tablet, Refills: 0    mometasone (NASONEX) 50 MCG/ACT nasal spray Place 2 sprays into the nose 2 (two) times daily.    mometasone-formoterol (DULERA) 200-5 MCG/ACT AERO Inhale 2 puffs into the lungs 2 (two) times daily.    montelukast (SINGULAIR) 10 MG tablet Take 1 tablet (10 mg total) by mouth at bedtime. Qty: 30 tablet, Refills: 5    nebivolol (BYSTOLIC) 10 MG tablet Take 1 tablet (10 mg total) by mouth daily. Qty: 90 tablet, Refills: 3    nitroGLYCERIN (NITROSTAT) 0.4 MG SL tablet Place 1 tablet (0.4 mg total) under the tongue every 5 (five) minutes as needed for chest pain. Qty: 90 tablet, Refills: 3   Associated Diagnoses: Palpitations; Malignant hypertension; Chronic diastolic CHF (congestive heart failure); Dyspnea    ondansetron (ZOFRAN-ODT) 4 MG disintegrating tablet Take 1 tablet (4 mg total) by mouth every 6 (six) hours as needed for nausea or vomiting. Qty: 20 tablet, Refills: 5    pantoprazole (PROTONIX) 40 MG tablet Take 1 tablet (40 mg total) by mouth 2 (two) times daily before a meal. Qty: 60 tablet, Refills: 3   Associated Diagnoses:  GERD (gastroesophageal reflux disease)    pregabalin (LYRICA) 200 MG capsule Take 1 capsule (200 mg total) by mouth 3 (three) times daily. Qty: 90 capsule, Refills: 5    Respiratory Therapy Supplies (FLUTTER) DEVI Use as directed. Qty: 1 each, Refills: 0    sucralfate (CARAFATE) 1 G tablet Take 1 g by mouth 4 (four) times daily.    traMADol (ULTRAM) 50 MG tablet 1-2 every 4 hours as needed for cough or pain Qty: 30 tablet, Refills: 0      STOP taking these medications     furosemide (LASIX) 40 MG tablet      potassium chloride SA (K-DUR,KLOR-CON) 20 MEQ tablet      amLODipine-olmesartan (AZOR) 10-40 MG per tablet      chlorpheniramine (CHLOR-TRIMETON) 4 MG tablet      dextromethorphan-guaiFENesin (MUCINEX DM) 30-600 MG per 12 hr tablet         Allergies  Allergen Reactions  . Aspirin Shortness Of Breath and Palpitations    Pt can take ibuprofen without reaction  . Doxycycline Anaphylaxis  . Penicillins Shortness Of Breath and Palpitations  . Kiwi Extract Itching and Swelling    Lip swelling  . Strawberry Hives   Follow-up Information    Follow up with Scarlette Calico, MD In 1 week.   Specialty:  Internal Medicine   Contact information:   [address] [place] [address] [phone]        The results of significant diagnostics from this hospitalization (including imaging, microbiology, ancillary and laboratory) are listed below for reference.    Significant Diagnostic Studies: Dg Lumbar Spine Complete  09/18/2014   CLINICAL DATA:  Low back pain.  Possible syncope.  EXAM: LUMBAR SPINE - COMPLETE 4+ VIEW  COMPARISON:  CT abdomen and pelvis 09/15/2013  FINDINGS: There is no evidence of lumbar spine fracture. Alignment is normal. Intervertebral disc spaces are maintained.  IMPRESSION: Negative.   Electronically Signed   By: Lucienne Capers M.D.   On: 09/18/2014 06:02   Ct Head Wo Contrast  09/20/2014   CLINICAL DATA:  39 year old female with altered mental status.  EXAM: CT HEAD WITHOUT CONTRAST  TECHNIQUE: Contiguous axial images were obtained from the base of the skull through the vertex without intravenous contrast.  COMPARISON:  09/18/2014 head CT and prior exams  FINDINGS: No intracranial abnormalities are identified, including mass lesion or mass effect, hydrocephalus, extra-axial fluid collection, midline shift, hemorrhage, or acute infarction.  The visualized bony calvarium is unremarkable.  IMPRESSION: Unremarkable noncontrast head CT.   Electronically Signed   By: Margarette Canada M.D.   On: 09/20/2014 16:55   Ct Head Wo Contrast  09/18/2014   CLINICAL DATA:  Initial evaluation for syncope.  EXAM: CT HEAD WITHOUT CONTRAST  CT CERVICAL SPINE WITHOUT CONTRAST  TECHNIQUE: Multidetector CT imaging of the  head and cervical spine was performed following the standard protocol without intravenous contrast. Multiplanar CT image reconstructions of the cervical spine were also generated.  COMPARISON:  Prior study from 08/24/2014  FINDINGS: CT HEAD FINDINGS  There is no acute intracranial hemorrhage or infarct. No mass lesion or midline shift. Gray-white matter differentiation is well maintained. Ventricles are normal in size without evidence of hydrocephalus. CSF containing spaces are within normal limits. No extra-axial fluid collection.  The calvarium is intact.  Orbital soft tissues are within normal limits.  The paranasal sinuses and mastoid air cells are well pneumatized and free of fluid.  Scalp soft tissues are unremarkable.  CT CERVICAL SPINE FINDINGS  Study is degraded by motion artifact and patient positioning. There is reversal of the normal cervical lordosis. Vertebral body heights are preserved. Normal C1-2 articulations are intact. No prevertebral soft tissue swelling. No acute fracture or listhesis.  Visualized soft tissues of the neck are within normal limits. Visualized lung apices are clear without evidence of apical pneumothorax.  IMPRESSION: CT BRAIN:  No acute intracranial process.  CT CERVICAL SPINE:  No acute traumatic injury within the cervical spine.   Electronically Signed   By: Jeannine Boga M.D.   On: 09/18/2014 06:17   Ct Cervical Spine Wo Contrast  09/18/2014   CLINICAL DATA:  Initial evaluation for syncope.  EXAM: CT HEAD WITHOUT CONTRAST  CT CERVICAL SPINE WITHOUT CONTRAST  TECHNIQUE: Multidetector CT imaging of the head and cervical spine was performed following the standard protocol without intravenous contrast. Multiplanar CT image reconstructions of the cervical spine were also generated.  COMPARISON:  Prior study from 08/24/2014  FINDINGS: CT HEAD FINDINGS  There is no acute intracranial hemorrhage or infarct. No mass lesion or midline shift. Gray-white matter differentiation  is well maintained. Ventricles are normal in size without evidence of hydrocephalus. CSF containing spaces are within normal limits. No extra-axial fluid collection.  The calvarium is intact.  Orbital soft tissues are within normal limits.  The paranasal sinuses and mastoid air cells are well pneumatized and free of fluid.  Scalp soft tissues are unremarkable.  CT CERVICAL SPINE FINDINGS  Study is degraded by motion artifact and patient positioning. There is reversal of the normal cervical lordosis. Vertebral body heights are preserved. Normal C1-2 articulations are intact. No prevertebral soft tissue swelling. No acute fracture or listhesis.  Visualized soft tissues of the neck are within normal limits. Visualized lung apices are clear without evidence of apical pneumothorax.  IMPRESSION: CT BRAIN:  No acute intracranial process.  CT CERVICAL SPINE:  No acute traumatic injury within the cervical spine.   Electronically Signed   By: Jeannine Boga M.D.   On: 09/18/2014 06:17   Mr Jeri Cos XK Contrast  08/24/2014   CLINICAL DATA:  Visual field defect. History of pseudotumor cerebri. Personal history of breast cancer without recurrence. Syncopal spell and tremors  EXAM: MRI HEAD WITHOUT AND WITH CONTRAST  TECHNIQUE: Multiplanar, multiecho pulse sequences of the brain and surrounding structures were obtained without and with intravenous contrast.  CONTRAST:  27mL MULTIHANCE GADOBENATE DIMEGLUMINE 529 MG/ML IV SOLN  COMPARISON:  CTA head 07/26/2014.  MRI brain 09/17/2011.  FINDINGS: The diffusion-weighted images demonstrate no evidence for acute or subacute infarction. Subcortical T2 hyperintensities have increased in number since the prior study. These are more prominent on left. No hemorrhage or mass lesion present. The ventricles are of normal size. No significant extraaxial fluid collection is present.  Flow is present in the major intracranial arteries. The globes and orbits are intact the paranasal sinuses  and mastoid air cells are clear.  The skullbase is within normal limits. Midline structures are normal.  The postcontrast images demonstrate no pathologic enhancement.  IMPRESSION: 1. No acute intracranial abnormality. 2. Increased number of multiple subcortical T2 hyperintensities more prominent left than right. The finding is nonspecific but can be seen in the setting of chronic microvascular ischemia, a demyelinating process such as multiple sclerosis, vasculitis, complicated migraine headaches, or as the sequelae of a prior infectious or inflammatory process.   Electronically Signed   By: San Morelle M.D.   On: 08/24/2014 21:34  Dg Chest Port 1 View  09/21/2014   CLINICAL DATA:  Patient status post central line placement.  EXAM: PORTABLE CHEST - 1 VIEW  COMPARISON:  09/20/2014  FINDINGS: Patient rotated to the right. Interval insertion of right internal jugular central venous catheter with tip projecting over the expected location of the superior vena cava. Stable enlarged cardiac and mediastinal contours. Monitoring leads overlie the patient. Minimal bibasilar heterogeneous opacities.  IMPRESSION: Right IJ central venous catheter tip projects over the expected location of the superior vena cava.   Electronically Signed   By: Lovey Newcomer M.D.   On: 09/21/2014 00:26   Dg Chest Port 1 View  09/20/2014   CLINICAL DATA:  Confusion  EXAM: PORTABLE CHEST - 1 VIEW  COMPARISON:  09/18/2014  FINDINGS: Hypoventilation with decreased lung polyp thin mild left lower lobe atelectasis. No definite pneumonia or heart failure. Cardiac enlargement, partially due to low lung volumes.  IMPRESSION: Hypoventilation with mild left lower lobe atelectasis.   Electronically Signed   By: Franchot Gallo M.D.   On: 09/20/2014 15:49   Dg Chest Port 1 View  09/18/2014   CLINICAL DATA:  Pt states she fell getting out of the car tonight and started wheezing and is short of breath  EXAM: PORTABLE CHEST - 1 VIEW  COMPARISON:   06/18/2014  FINDINGS: Exam is lordotic. Normal mediastinum and cardiac silhouette. Normal pulmonary vasculature. No evidence of effusion, infiltrate, or pneumothorax. No acute bony abnormality.  IMPRESSION: Normal chest radiograph.   Electronically Signed   By: Suzy Bouchard M.D.   On: 09/18/2014 07:53    Microbiology: Recent Results (from the past 240 hour(s))  MRSA PCR Screening     Status: None   Collection Time: 09/20/14  8:26 PM  Result Value Ref Range Status   MRSA by PCR NEGATIVE NEGATIVE Final    Comment:        The GeneXpert MRSA Assay (FDA approved for NASAL specimens only), is one component of a comprehensive MRSA colonization surveillance program. It is not intended to diagnose MRSA infection nor to guide or monitor treatment for MRSA infections.      Labs: Basic Metabolic Panel:  Recent Labs Lab 09/18/14 0315 09/18/14 1029 09/20/14 1549 09/20/14 2240 09/21/14 0423 09/22/14 0454  NA 143 144 137 140 139 142  141  K 2.9* 3.9 4.2 3.6 2.9* 3.3*  3.4*  CL 116* 113* 110 108 109 108  111  CO2 19*  --  18* 19* 21* 23  23  GLUCOSE 100* 98 107* 93 89 121*  109*  BUN 12 11 34* 27* 21* 17  15  CREATININE 0.93 0.90 4.27* 2.64* 1.62* 1.06*  0.95  CALCIUM 8.8*  --  8.6* 8.6* 8.3* 9.3  9.5  MG  --   --   --   --   --  2.2  PHOS  --   --   --   --   --  2.2*  2.4*   Liver Function Tests:  Recent Labs Lab 09/20/14 1549 09/22/14 0454  AST 28  --   ALT 27  --   ALKPHOS 53  --   BILITOT 0.5  --   PROT 6.9  --   ALBUMIN 3.6 3.7   No results for input(s): LIPASE, AMYLASE in the last 168 hours.  Recent Labs Lab 09/20/14 1901  AMMONIA 25   CBC:  Recent Labs Lab 09/18/14 0315 09/18/14 1029 09/20/14 1549 09/20/14 2240 09/21/14 0423 09/22/14 0454  WBC 7.5  --  9.2 8.6 6.4 8.0  NEUTROABS  --   --  6.3  --   --   --   HGB 13.4 16.3* 11.9* 13.1 11.8* 12.4  HCT 40.2 48.0* 37.7 38.8 35.8* 36.3  MCV 89.1  --  92.2 88.8 87.1 86.2  PLT 241  --  231  208 211 242   Cardiac Enzymes:  Recent Labs Lab 09/20/14 1904  CKTOTAL 2673*   BNP: BNP (last 3 results) No results for input(s): BNP in the last 8760 hours.  ProBNP (last 3 results)  Recent Labs  08/01/14 1120  PROBNP 16.0    CBG:  Recent Labs Lab 09/21/14 0333 09/21/14 0810 09/21/14 1722 09/21/14 2326 09/22/14 0627  GLUCAP 87 87 105* 115* 111*       Signed:  Norvella Loscalzo A  [facility] 09/22/2014, 11:04 AM

## 2014-09-22 NOTE — Progress Notes (Addendum)
Pt ambulated in hallway with no resp distress and no fatigue

## 2014-09-26 ENCOUNTER — Telehealth: Payer: Self-pay | Admitting: Internal Medicine

## 2014-09-26 NOTE — Telephone Encounter (Signed)
Rinaldo Ratel, CMA at 09/14/2014 11:20 AM     Status: Signed       Expand All Collapse All   Received notice from [place] that pt's Proventil HFA and Dulera 200 are no longer covered by insurance Alternatives were provided:  Proventil HFA >> Ventolin HFA, Xopenex HFA Dulera >> Symbicort, Advair HFA, Advair Diskus, Breo  Discussed with TP: okay to change to Ventolin and Symbicort 160.  Called spoke with patient to discuss the above. Pt stated she believes that she has been on Symbicort in the past, but chart does not support this. Verified in the chart that she has been on Advair previously and was changed over to [facility] in 2012. Advised pt that typically insurance will not cover unless pt has tried and failed 2 or more covered alternatives. She is okay with trying the Symbicort and will call the office to update [place] if she feels it does not control her symptoms.  Rx's sent to verified pharmacy. Nothing further needed; will sign off.       Spoke with pt. States that she would like to try Breo instead of Symbicort. She has a coupon.  MW - please advise if pt can try Breo in place of Symbicort. Thanks.

## 2014-09-29 ENCOUNTER — Ambulatory Visit (INDEPENDENT_AMBULATORY_CARE_PROVIDER_SITE_OTHER): Payer: Medicare Other | Admitting: Internal Medicine

## 2014-09-29 ENCOUNTER — Encounter: Payer: Self-pay | Admitting: Internal Medicine

## 2014-09-29 ENCOUNTER — Other Ambulatory Visit (INDEPENDENT_AMBULATORY_CARE_PROVIDER_SITE_OTHER): Payer: Medicare Other

## 2014-09-29 VITALS — BP 120/80 | HR 76 | Temp 98.6°F | Resp 16 | Ht 65.0 in | Wt 199.5 lb

## 2014-09-29 DIAGNOSIS — I10 Essential (primary) hypertension: Secondary | ICD-10-CM

## 2014-09-29 DIAGNOSIS — R269 Unspecified abnormalities of gait and mobility: Secondary | ICD-10-CM

## 2014-09-29 LAB — BASIC METABOLIC PANEL
BUN: 17 mg/dL (ref 6–23)
CO2: 23 mEq/L (ref 19–32)
CREATININE: 1.07 mg/dL (ref 0.40–1.20)
Calcium: 9.3 mg/dL (ref 8.4–10.5)
Chloride: 113 mEq/L — ABNORMAL HIGH (ref 96–112)
GFR: 72.67 mL/min (ref >60.00)
Glucose, Bld: 99 mg/dL (ref 70–99)
Potassium: 3.9 mEq/L (ref 3.5–5.1)
SODIUM: 142 meq/L (ref 135–145)

## 2014-09-29 NOTE — Patient Instructions (Signed)

## 2014-09-29 NOTE — Telephone Encounter (Signed)
MW please advise on recs.  Thanks!

## 2014-09-29 NOTE — Progress Notes (Signed)
Pre visit review using our clinic review tool, if applicable. No additional management support is needed unless otherwise documented below in the visit note. 

## 2014-09-29 NOTE — Progress Notes (Signed)
Subjective:  Patient ID: Erica [name], female    DOB: [birth year]  Age: 39 y.o. MRN: [id number]  CC: Hypertension   HPI Erica [name] presents for follow up after a recent admission for AKI and AMS/encephalopathy. She feels well since discharge and tells me that she has not consumed any alcohol. She rarely has nausea but is keeping down liquids without difficulty. Her cough is well controlled with tramadol.  Outpatient Prescriptions Prior to Visit  Medication Sig Dispense Refill  . acetaZOLAMIDE (DIAMOX) 250 MG tablet Take 4 tablets (1,000 mg total) by mouth 3 (three) times daily. (Patient taking differently: Take 250 mg by mouth 3 (three) times daily. ) 400 tablet 5  . albuterol (PROVENTIL HFA;VENTOLIN HFA) 108 (90 BASE) MCG/ACT inhaler Inhale 2 puffs into the lungs every 4 (four) hours as needed for wheezing or shortness of breath (((PLAN A))). 18 g 5  . albuterol (PROVENTIL) (2.5 MG/3ML) 0.083% nebulizer solution Take 3 mLs (2.5 mg total) by nebulization every 4 (four) hours as needed for wheezing or shortness of breath (((PLAN B))). 300 mL 5  . Biotin 10 MG TABS Take 1 tablet by mouth daily.    . cloNIDine (CATAPRES) 0.3 MG tablet Take 1 tablet (0.3 mg total) by mouth 2 (two) times daily. 90 tablet 3  . diltiazem (CARDIZEM CD) 240 MG 24 hr capsule Take 240 mg by mouth daily.    Marland Kitchen EPINEPHrine 0.3 mg/0.3 mL IJ SOAJ injection Inject 0.3 mLs (0.3 mg total) into the muscle once. 2 Device 1  . hyoscyamine (LEVSIN SL) 0.125 MG SL tablet Place 1 tablet (0.125 mg total) under the tongue every 8 (eight) hours as needed for cramping. (Patient taking differently: Place 0.125 mg under the tongue 3 (three) times daily. ) 30 tablet 2  . isosorbide mononitrate (IMDUR) 60 MG 24 hr tablet Take 1 tablet (60 mg total) by mouth daily. 30 tablet 11  . metoCLOPramide (REGLAN) 10 MG tablet TAKE 1 TABLET BY MOUTH FOUR TIMES DAILY WITH MEALS AND AT BEDTIME 120 tablet 0  . mometasone (NASONEX) 50 MCG/ACT nasal  spray Place 2 sprays into the nose 2 (two) times daily.    . mometasone-formoterol (DULERA) 200-5 MCG/ACT AERO Inhale 2 puffs into the lungs 2 (two) times daily.    . montelukast (SINGULAIR) 10 MG tablet Take 1 tablet (10 mg total) by mouth at bedtime. 30 tablet 5  . nebivolol (BYSTOLIC) 10 MG tablet Take 1 tablet (10 mg total) by mouth daily. (Patient taking differently: Take 10 mg by mouth at bedtime. ) 90 tablet 3  . nitroGLYCERIN (NITROSTAT) 0.4 MG SL tablet Place 1 tablet (0.4 mg total) under the tongue every 5 (five) minutes as needed for chest pain. 90 tablet 3  . ondansetron (ZOFRAN-ODT) 4 MG disintegrating tablet Take 1 tablet (4 mg total) by mouth every 6 (six) hours as needed for nausea or vomiting. 20 tablet 5  . pantoprazole (PROTONIX) 40 MG tablet Take 1 tablet (40 mg total) by mouth 2 (two) times daily before a meal. 60 tablet 3  . pregabalin (LYRICA) 200 MG capsule Take 1 capsule (200 mg total) by mouth 3 (three) times daily. 90 capsule 5  . Respiratory Therapy Supplies (FLUTTER) DEVI Use as directed. 1 each 0  . sucralfate (CARAFATE) 1 G tablet Take 1 g by mouth 4 (four) times daily.    . traMADol (ULTRAM) 50 MG tablet 1-2 every 4 hours as needed for cough or pain 30 tablet 0  .  LORazepam (ATIVAN) 0.5 MG tablet TAKE 1 TABLET BY MOUTH EVERY 8 HOURS AS NEEDED FOR ANXIETY 90 tablet 0  . Tetrahydrozoline HCl (EYE DROPS OP) Place 1 drop into both eyes daily.     No facility-administered medications prior to visit.    ROS Review of Systems  Constitutional: Negative.  Negative for fever, chills, diaphoresis, appetite change and fatigue.  HENT: Negative.  Negative for trouble swallowing and voice change.   Eyes: Negative.   Respiratory: Positive for cough (chronic NP). Negative for chest tightness, shortness of breath, wheezing and stridor.   Cardiovascular: Negative.  Negative for chest pain, palpitations and leg swelling.  Gastrointestinal: Positive for nausea. Negative for  vomiting, abdominal pain, diarrhea and constipation.  Endocrine: Negative.   Genitourinary: Negative.  Negative for dysuria, urgency, frequency, hematuria, flank pain, decreased urine volume and difficulty urinating.  Musculoskeletal: Negative.   Skin: Negative.  Negative for rash.  Allergic/Immunologic: Negative.   Neurological: Negative.  Negative for dizziness, syncope, speech difficulty, light-headedness, numbness and headaches.  Hematological: Negative.  Negative for adenopathy. Does not bruise/bleed easily.  Psychiatric/Behavioral: Negative.  Negative for suicidal ideas, behavioral problems, sleep disturbance, dysphoric mood, decreased concentration and agitation. The patient is not nervous/anxious.     Objective:  BP 120/80 mmHg  Pulse 76  Temp(Src) 98.6 F (37 C) (Oral)  Resp 16  Ht 5\' 5"  (1.651 m)  Wt 199 lb 8 oz (90.493 kg)  BMI 33.20 kg/m2  BP Readings from Last 3 Encounters:  09/29/14 120/80  09/22/14 141/78  09/22/14 119/64    Wt Readings from Last 3 Encounters:  09/29/14 199 lb 8 oz (90.493 kg)  09/22/14 197 lb 5 oz (89.5 kg)  09/18/14 198 lb (89.812 kg)    Physical Exam  Constitutional: She is oriented to person, place, and time. She appears well-developed and well-nourished. No distress.  HENT:  Mouth/Throat: Oropharynx is clear and moist. No oropharyngeal exudate.  Eyes: Conjunctivae are normal. Right eye exhibits no discharge. Left eye exhibits no discharge. No scleral icterus.  Neck: Normal range of motion. Neck supple. No JVD present. No tracheal deviation present. No thyromegaly present.  Cardiovascular: Normal rate, regular rhythm, normal heart sounds and intact distal pulses.  Exam reveals no gallop and no friction rub.   No murmur heard. Pulmonary/Chest: Effort normal. No stridor. No respiratory distress. She has no wheezes. She has no rales. She exhibits no tenderness.  Abdominal: Soft. Bowel sounds are normal. She exhibits no distension and no mass.  There is no tenderness. There is no rebound and no guarding.  Musculoskeletal: Normal range of motion. She exhibits no edema or tenderness.  Lymphadenopathy:    She has no cervical adenopathy.  Neurological: She is alert and oriented to person, place, and time. She has normal reflexes. She displays normal reflexes. No cranial nerve deficit. She exhibits normal muscle tone. Coordination normal.  Skin: Skin is warm and dry. No rash noted. She is not diaphoretic. No erythema. No pallor.  Psychiatric: She has a normal mood and affect. Her behavior is normal. Judgment and thought content normal.  Vitals reviewed.   Lab Results  Component Value Date   WBC 8.0 09/22/2014   HGB 12.4 09/22/2014   HCT 36.3 09/22/2014   PLT 242 09/22/2014   GLUCOSE 99 09/29/2014   CHOL 172 02/03/2014   TRIG 151.0* 02/03/2014   HDL 32.00* 02/03/2014   LDLCALC 110* 02/03/2014   ALT 27 09/20/2014   AST 28 09/20/2014   NA 142 09/29/2014  K 3.9 09/29/2014   CL 113* 09/29/2014   CREATININE 1.07 09/29/2014   BUN 17 09/29/2014   CO2 23 09/29/2014   TSH 0.570 09/20/2014   INR 1.1* 06/03/2012   HGBA1C 5.5 08/09/2014    Ct Head Wo Contrast  09/20/2014   CLINICAL DATA:  39 year old female with altered mental status.  EXAM: CT HEAD WITHOUT CONTRAST  TECHNIQUE: Contiguous axial images were obtained from the base of the skull through the vertex without intravenous contrast.  COMPARISON:  09/18/2014 head CT and prior exams  FINDINGS: No intracranial abnormalities are identified, including mass lesion or mass effect, hydrocephalus, extra-axial fluid collection, midline shift, hemorrhage, or acute infarction.  The visualized bony calvarium is unremarkable.  IMPRESSION: Unremarkable noncontrast head CT.   Electronically Signed   By: Margarette Canada M.D.   On: 09/20/2014 16:55   Dg Chest Port 1 View  09/21/2014   CLINICAL DATA:  Patient status post central line placement.  EXAM: PORTABLE CHEST - 1 VIEW  COMPARISON:  09/20/2014   FINDINGS: Patient rotated to the right. Interval insertion of right internal jugular central venous catheter with tip projecting over the expected location of the superior vena cava. Stable enlarged cardiac and mediastinal contours. Monitoring leads overlie the patient. Minimal bibasilar heterogeneous opacities.  IMPRESSION: Right IJ central venous catheter tip projects over the expected location of the superior vena cava.   Electronically Signed   By: Lovey Newcomer M.D.   On: 09/21/2014 00:26   Dg Chest Port 1 View  09/20/2014   CLINICAL DATA:  Confusion  EXAM: PORTABLE CHEST - 1 VIEW  COMPARISON:  09/18/2014  FINDINGS: Hypoventilation with decreased lung polyp thin mild left lower lobe atelectasis. No definite pneumonia or heart failure. Cardiac enlargement, partially due to low lung volumes.  IMPRESSION: Hypoventilation with mild left lower lobe atelectasis.   Electronically Signed   By: Franchot Gallo M.D.   On: 09/20/2014 15:49    Assessment & Plan:   Marielena was seen today for hypertension.  Diagnoses and all orders for this visit:  Malignant hypertension- her BP is well controlled, her lytes and renal function are normal, will cont the current meds, I will try to avoid prescribing narcotics for her since there was some concern for alcohol and substance use during her recent admission Orders: -     Basic metabolic panel; Future  I have discontinued Ms. Ishmael's LORazepam and Tetrahydrozoline HCl (EYE DROPS OP). I am also having her maintain her cloNIDine, nitroGLYCERIN, pantoprazole, montelukast, albuterol, EPINEPHrine, ondansetron, isosorbide mononitrate, FLUTTER, nebivolol, Biotin, mometasone, hyoscyamine, pregabalin, acetaZOLAMIDE, albuterol, traMADol, mometasone-formoterol, sucralfate, diltiazem, and metoCLOPramide.  No orders of the defined types were placed in this encounter.     Follow-up: Return in about 4 months (around 01/29/2015).  Scarlette Calico, MD

## 2014-09-30 ENCOUNTER — Encounter: Payer: Self-pay | Admitting: Internal Medicine

## 2014-09-30 NOTE — Telephone Encounter (Signed)
dme pended for PCP review on his return.

## 2014-10-04 ENCOUNTER — Telehealth: Payer: Self-pay | Admitting: Internal Medicine

## 2014-10-04 NOTE — Telephone Encounter (Signed)
Spoke with pt. She is aware of the information below. Pt has Medicare, so the coupon will not work for her. States she has taken Symbicort in the past and it didn't work.  MW - please advise. Thanks.

## 2014-10-04 NOTE — Telephone Encounter (Signed)
Duplicate message (see telephone encounter 09/26/14)

## 2014-10-04 NOTE — Telephone Encounter (Signed)
lmomtcb for pt 

## 2014-10-04 NOTE — Telephone Encounter (Signed)
We need to regroup asap with all meds in hand as this indicates to me the problem is not asthma related (symbicort is the very best asthma med available)

## 2014-10-04 NOTE — Telephone Encounter (Signed)
Spoke with pt. ROV has been scheduled for 10/05/14 at 12pm with MW. Nothing further was needed.

## 2014-10-04 NOTE — Telephone Encounter (Signed)
MW please advise.  Thanks.  

## 2014-10-04 NOTE — Telephone Encounter (Signed)
Not a good choice since she tends to cough and have pseudowheeze - if she has commercial insurance symbicort should be free using the 0 coupon we have here so just needs to pick one up

## 2014-10-05 ENCOUNTER — Encounter: Payer: Self-pay | Admitting: Internal Medicine

## 2014-10-05 ENCOUNTER — Ambulatory Visit (INDEPENDENT_AMBULATORY_CARE_PROVIDER_SITE_OTHER): Payer: Medicare Other | Admitting: Internal Medicine

## 2014-10-05 VITALS — BP 142/90 | HR 85 | Ht 67.0 in | Wt 195.0 lb

## 2014-10-05 DIAGNOSIS — R058 Other specified cough: Secondary | ICD-10-CM

## 2014-10-05 DIAGNOSIS — R05 Cough: Secondary | ICD-10-CM

## 2014-10-05 DIAGNOSIS — I10 Essential (primary) hypertension: Secondary | ICD-10-CM

## 2014-10-05 DIAGNOSIS — J4540 Moderate persistent asthma, uncomplicated: Secondary | ICD-10-CM

## 2014-10-05 MED ORDER — BUDESONIDE-FORMOTEROL FUMARATE 160-4.5 MCG/ACT IN AERO: INHALATION_SPRAY | RESPIRATORY_TRACT | Status: DC

## 2014-10-05 NOTE — Patient Instructions (Addendum)
symbicort 160 Take 2 puffs first thing in am and then another 2 puffs about 12 hours later.   Try taking the tramadol around 2 am to see if helps with the early am cough/ wheeze   See calendar for specific medication instructions and bring it back for each and every office visit for every healthcare provider you see.  Without it,  you may not receive the best quality medical care that we feel you deserve.  You will note that the calendar groups together  your maintenance  medications that are timed at particular times of the day.  Think of this as your checklist for what your doctor has instructed you to do until your next evaluation to see what benefit  there is  to staying on a consistent group of medications intended to keep you well.  The other group at the bottom is entirely up to you to use as you see fit  for specific symptoms that may arise between visits that require you to treat them on an as needed basis.  Think of this as your action plan or "what if" list.   Separating the top medications from the bottom group is fundamental to providing you adequate care going forward.    Please schedule a follow up office visit in 2 weeks, sooner if needed with all medications in 2 separate bags

## 2014-10-09 ENCOUNTER — Encounter: Payer: Self-pay | Admitting: Internal Medicine

## 2014-10-09 DIAGNOSIS — R05 Cough: Secondary | ICD-10-CM | POA: Insufficient documentation

## 2014-10-09 DIAGNOSIS — E669 Obesity, unspecified: Secondary | ICD-10-CM | POA: Insufficient documentation

## 2014-10-09 DIAGNOSIS — R058 Other specified cough: Secondary | ICD-10-CM | POA: Insufficient documentation

## 2014-10-09 DIAGNOSIS — I10 Essential (primary) hypertension: Secondary | ICD-10-CM

## 2014-10-09 HISTORY — DX: Essential (primary) hypertension: I10

## 2014-10-09 HISTORY — DX: Other specified cough: R05.8

## 2014-10-09 NOTE — Assessment & Plan Note (Signed)
Complicated by reflux/ hpb/ restrictive pfts  Body mass index is 30.53 kg/(m^2).  Lab Results  Component Value Date   TSH 0.570 09/20/2014     Contributing to gerd tendency/ doe/ needs to achieve and maintain neg calorie balance > f/u primary care

## 2014-10-09 NOTE — Assessment & Plan Note (Signed)
- PFT's 01/31/2011 FEV1  1.76 (60% ) ratio 68 and no better p B2,  DLCO 70% -med review 09/25/2012 > Dulera drug assistance  paperwork given    - HFA 90% 11/20/2012 .  -Med calendar 12/07/12 , 04/06/2013 , 04/18/2014,, 08/30/2014  - PFT's 01/12/2013  1.73 (65%)  Ratio 73 and no better p B2, DLCO 86% - trial off singulair 02/08/13 >>>did not stop  - 04/05/2014  trial of dulera 100 instead of 200 > worse  -04/18/2014 increase Dulera 200  - 08/01/2014 p extensive coaching HFA effectiveness =    90% - 08/01/2014  Walked RA x 2laps @ 185 ft each stopped due to cough > sob/ no desat moderate pace  - spirometry 08/01/2014  FEV1  1.87 (70%) ratio 78  fef 25-75 53%   Despite normalization of pfts she still remains "neb dep" so if this is due to asthma is remains difficult to control. DDX of  difficult airways management all start with A and  include Adherence, Ace Inhibitors, Acid Reflux, Active Sinus Disease, Alpha 1 Antitripsin deficiency, Anxiety masquerading as Airways dz,  ABPA,  allergy(esp in young), Aspiration (esp in elderly), Adverse effects of meds,  Active smokers, A bunch of PE's (a small clot burden can't cause this syndrome unless there is already severe underlying pulm or vascular dz with poor reserve) plus two Bs  = Bronchiectasis and Beta blocker use..and one C= CHF  Adherence is always the initial "prime suspect" and is a multilayered concern that requires a "trust but verify" approach in every patient - starting with knowing how to use medications, especially inhalers, correctly, keeping up with refills and understanding the fundamental difference between maintenance and prns vs those medications only taken for a very short course and then stopped and not refilled.  - disappointed she is not keeping up with med calendar > return in 2 weeks to start over.  To keep things simple, I have asked the patient to first separate medicines that are perceived as maintenance, that is to be taken daily "no matter what",  from those medicines that are taken on only on an as-needed basis and I have given the patient examples of both, and then return to see me and use a trust but verify approach here as she is on a very complex regimen - The proper method of use, as well as anticipated side effects, of a metered-dose inhaler are discussed and demonstrated to the patient. Improved effectiveness after extensive coaching during this visit to a level of approximately  90%   I had an extended discussion with the patient reviewing all relevant studies completed to date and  lasting 15 to 20 minutes of a 25 minute visit    Formulary restrictions will be an ongoing challenge for the forseable future and I would be happy to pick an alternative if the pt will first  provide me a list of them but pt  will need to return here for training for any new device that is required eg dpi vs hfa vs respimat.    In meantime we can always provide samples so the patient never runs out of any needed respiratory medications.   For now will try symbicort 160 2bid   Each maintenance medication was reviewed in detail including most importantly the difference between maintenance and prns and under what circumstances the prns are to be triggered using an action plan format that is not reflected in the computer generated alphabetically organized AVS.  Please see instructions for details which were reviewed in writing and the patient given a copy highlighting the part that I personally wrote and discussed at today's ov.

## 2014-10-09 NOTE — Assessment & Plan Note (Signed)
Classic Upper airway cough syndrome, so named because it's frequently impossible to sort out how much is  CR/sinusitis with freq throat clearing (which can be related to primary GERD)   vs  causing  secondary (" extra esophageal")  GERD from wide swings in gastric pressure that occur with throat clearing, often  promoting self use of mint and menthol lozenges that reduce the lower esophageal sphincter tone and exacerbate the problem further in a cyclical fashion.   These are the same pts (now being labeled as having "irritable larynx syndrome" by some cough centers) who not infrequently have a history of having failed to tolerate ace inhibitors,  dry powder inhalers or biphosphonates or report having atypical reflux symptoms that don't respond to standard doses of PPI , and are easily confused as having aecopd or asthma flares by even experienced allergists/ pulmonologists.   rec try h1 x 2 at hs and delay the pm tramadol dosing to when she really needs it  Also reviewed with pt: The standardized cough guidelines published in Chest by Lissa Morales in 2006 are still the best available and consist of a multiple step process (up to 12!) , not a single office visit,  and are intended  to address this problem logically,  with an alogrithm dependent on response to empiric treatment at  each progressive step  to determine a specific diagnosis with  minimal addtional testing needed. Therefore if adherence is an issue or can't be accurately verified,  it's very unlikely the standard evaluation and treatment will be successful here.    Furthermore, response to therapy (other than acute cough suppression, which should only be used short term with avoidance of narcotic containing cough syrups if possible), can be a gradual process for which the patient may perceive immediate benefit.  Unlike going to an eye doctor where the best perscription is almost always the first one and is immediately effective, this is  almost never the case in the management of chronic cough syndromes. Therefore the patient needs to commit up front to consistently adhere to recommendations  for up to 6 weeks of therapy directed at the likely underlying problem(s) before the response can be reasonably evaluated.

## 2014-10-09 NOTE — Progress Notes (Signed)
Subjective:    Patient ID: Erica [name], female    DOB: [birth year]   MRN: [id number]    Brief patient profile:  13 yobf with asthma all her life much worse since around 07/2010 referred by Dr [name] to pulmonary clinic in Sept 2012 Hx of Breast Cancer 05/2008    History of Present Illness  12/18/2010 Initial pulmonary office eval on acei and  advair cc dtc asthma x 4 months but baseline = excess saba use "even when better" at least twice daily despite daily advair, worse at hs in fact  used saba 3 x last night.  On chemo /RT for breast ca but last rx was Jan 2012 well before asthma worse.  To er multiple times >  no better with singular added x 3 months. rec Stop Lisinopril Dulera 200 Take 2 puffs first thing in am and then another 2 puffs about 12 hours later.  Work on inhaler technique  Nexium 40 mg   Take 30-60 min before first meal of the day and Pepcid 20 mg one bedtime until return Only use your albuterol as a rescue medication to be used if you can't catch your breath by resting or doing a relaxed purse lip breathing pattern. The less you use it, the better it will work when you need it.  Prednisone 10 mg take  4 each am x 2 days,   2 each am x 2 days,  1 each am x2days and stop    01/03/2011 f/u ov/Erica [name] cc much better off ace and advair but still needing saba twice daily on avg. No  Purulent sputum. rec no change in rx/ rule of 2's reviewed     04/06/2013 NP Follow up and Med Review   We reviewed all her meds and organized them into a med calendar. She does have Medicaid but depends on samples. Did not bring all her meds with her today , says they are at home.  Says her breathing is the same since last ov w/ SOB, Has on /off wheezing, some chest heaviness, prod cough with yellow mucus. Good and bad days. Wears out easily .  Denies f/c/s, nausea, vomiting.  Did not stop singulair as recommended.  rec  Follow med calendar closely and bring to each visit > did not do  We are  referring you for a CPSTwith spirometry before and after> never done   04/18/2014 NP Follow up and Med Review / NP  Patient returns for a two-week follow-up and medication review. We reviewed all her medications organize them into a medication count with patient education It appears that she is taking her medications correctly, however, she has over 30 medications and has had several changes recently. Last visit, her [name] was decreased to the 164mcg says that her breathing was better on the 278mcg Patient had a chest x-ray last visit that showed no acute changes She underwent a CT sinus due to persistent cough , which was positive for sinusitis on the right She was treated with a ten-day course of Avelox, she has one dose left Does feel some better but still has cough . She denies any hemoptysis, orthopnea, PND or leg swelling  Patient has upcoming sleep study. Next week for suspected underlying sleep apnea with snoring and daytime sleepiness rec Refer to ENT for sinusitis and chronic cough > never saw one> f/u sinus CT neg 07/26/14  Increase Dulera 200 2 puffs Twice daily   Follow med calendar closely and bring to  each visit.     08/01/2014 f/u ov/Erica [name] re:  Asthma plus noct "gasping"  Chief Complaint  Patient presents with  . Follow-up    doing well.  no complaints.  not doing well x one year with noct / early  4am with cough/ gasping for air almost always goes to neb and then can't go back to sleep even 30 min later better Daytime in chest tightness resolves  completely while on prednisone last took one month prior to OV  And doesn't have it now but has exact same noct symptoms and overusing saba  Doe x mailbox and back to house out of breath and coughing  >>change dulera 200 to 100   08/30/2014 NP Follow up and Med review : Asthma Patient presents for a one-month follow-up We reviewed all her medications organize them into a medication count with patient education Patient has had  multiple medication changes recently She has been having difficulty with resistant hypertension and visual changes. Recently started on primidone and spironolactone . However, has not started this as of yet That she needs to go pick this up at the pharmacy. Neuro notes reviewed , has been referred to NS . B/p is elevated again at todays visit, discussed follow up with PCP for b/p management.  She tried to decrease her Dulera as recommended from last ov with Dr. Melvyn Novas    however, was unable to and is back on 200 dose .  Says overall her breathing is doing ok  No flare of cough or wheezing  Does have a lot of drainage in throat . Discussed starting on chlortrimeton.  rec Continue Dulera 200 2 puffs Twice daily  , rinse after use  Follow med calendar closely and bring to each visit.  Follow up with Primary MD concerning elevated blood pressure    10/05/2014 f/u ov/Erica [name] re: asthma with component of uacs/ no med calendar  Chief Complaint  Patient presents with  . Follow-up    Here to discuss meds- Dulera not on her drug formulary. Pt states breathing is doing well and denies any new co's today.     turns out using neb at least once every 24 h, esp between 2 and 4 am when wakes up with severe dry cough x months/ pred helps some but never eliminated noct cough  No obvious day to day or daytime variability or assoc sob or cp or chest tightness, subjective wheeze or overt sinus or hb symptoms. No unusual exp hx or h/o childhood pna/ asthma or knowledge of premature birth.  Sleeping ok without nocturnal  or early am exacerbation  of respiratory  c/o's or need for noct saba. Also denies any obvious fluctuation of symptoms with weather or environmental changes or other aggravating or alleviating factors except as outlined above   Current Medications, Allergies, Complete Past Medical History, Past Surgical History, Family History, and Social History were reviewed in [employer]  record.  ROS  The following are not active complaints unless bolded sore throat, dysphagia, dental problems, itching, sneezing,  nasal congestion or excess/ purulent secretions, ear ache,   fever, chills, sweats, unintended wt loss, classically pleuritic or exertional cp, hemoptysis,  orthopnea pnd or leg swelling, presyncope, palpitations, abdominal pain, anorexia, nausea, vomiting, diarrhea  or change in bowel or bladder habits, change in stools or urine, dysuria,hematuria,  rash, arthralgias, visual complaints, headache, numbness, weakness or ataxia or problems with walking or coordination,  change in mood/affect or memory.  Objective:   Physical Exam  Obese bf nad / vital signs reviewed - note hbp  Wt 213  12/18/10 > 01/03/2011  214 > 01/31/2011  206 >  09/11/2012 209 >208 6/19 > 210 09/25/2012 > 11/20/2012 211 >208 12/07/12> 01/12/2013 209 > 02/09/2013 210 >207 04/06/2013 > 07/14/2013  213>212 07/28/2013 >204 01/26/2014> 04/04/2014 206  >205 04/18/2014 >  07/18/2014 201 >  08/01/14 201 >198 08/30/2014 > 10/05/14  195   HEENT: nl dentition, turbinates, and orophanx. Nl external ear canals without cough reflex, NM swollen    NECK :  without JVD/Nodes/TM/ nl carotid upstrokes bilaterally   LUNGS: lungs clear bilaterally with good air movement   CV:  RRR  no s3 or murmur or increase in P2, no edema   ABD:  soft and nontender with nl excursion in the supine position. No bruits or organomegaly, bowel sounds nl  MS:  warm without deformities, calf tenderness, cyanosis or clubbing     I personally reviewed images and agree with radiology impression as follows:  CXR:  06/08/14  No active cardiopulmonary disease.     Labs reviewed:   Lab 07/28/14 1507  NA 143  K 3.2*  CO2 25  BUN 8.4  CREATININE 0.9  GLUCOSE 152*       Lab 07/28/14 1507  HGB 13.3  HCT 41.1  WBC 5.7  PLT 199     Lab Results  Component Value Date   TSH 1.70 08/01/2014     Lab Results   Component Value Date   PROBNP 16.0 08/01/2014

## 2014-10-09 NOTE — Assessment & Plan Note (Signed)
Not Adequate control on present rx, reviewed > no change in rx needed  For now but should avoid Na/ f/u primary care/ min steroid exp

## 2014-10-19 ENCOUNTER — Ambulatory Visit (INDEPENDENT_AMBULATORY_CARE_PROVIDER_SITE_OTHER): Payer: Medicare Other | Admitting: Internal Medicine

## 2014-10-19 ENCOUNTER — Encounter: Payer: Self-pay | Admitting: Internal Medicine

## 2014-10-19 VITALS — BP 168/104 | HR 103 | Ht 66.0 in | Wt 191.8 lb

## 2014-10-19 DIAGNOSIS — J4540 Moderate persistent asthma, uncomplicated: Secondary | ICD-10-CM | POA: Diagnosis not present

## 2014-10-19 DIAGNOSIS — I10 Essential (primary) hypertension: Secondary | ICD-10-CM

## 2014-10-19 MED ORDER — NEBIVOLOL HCL 10 MG PO TABS: ORAL_TABLET | ORAL | Status: DC

## 2014-10-19 MED ORDER — PREDNISONE 10 MG PO TABS: ORAL_TABLET | ORAL | Status: DC

## 2014-10-19 NOTE — Progress Notes (Signed)
Subjective:    Patient ID: Erica [name], female    DOB: [birth year]   MRN: [id number]    Brief patient profile:  58 yobf with asthma all her life much worse since around 07/2010 referred by Dr Erica [name] to pulmonary clinic in Sept 2012 Hx of Breast Cancer 05/2008    History of Present Illness  12/18/2010 Initial pulmonary office eval on acei and  advair cc dtc asthma x 4 months but baseline = excess saba use "even when better" at least twice daily despite daily advair, worse at hs in fact  used saba 3 x last night.  On chemo /RT for breast ca but last rx was Jan 2012 well before asthma worse.  To er multiple times >  no better with singular added x 3 months. rec Stop Lisinopril Dulera 200 Take 2 puffs first thing in am and then another 2 puffs about 12 hours later.  Work on inhaler technique  Nexium 40 mg   Take 30-60 min before first meal of the day and Pepcid 20 mg one bedtime until return Only use your albuterol as a rescue medication to be used if you can't catch your breath by resting or doing a relaxed purse lip breathing pattern. The less you use it, the better it will work when you need it.  Prednisone 10 mg take  4 each am x 2 days,   2 each am x 2 days,  1 each am x2days and stop    01/03/2011 f/u ov/Erica [name] cc much better off ace and advair but still needing saba twice daily on avg. No  Purulent sputum. rec no change in rx/ rule of 2's reviewed     04/06/2013 NP Follow up and Med Review   We reviewed all her meds and organized them into a med calendar. She does have Medicaid but depends on samples. Did not bring all her meds with her today , says they are at home.  Says her breathing is the same since last ov w/ SOB, Has on /off wheezing, some chest heaviness, prod cough with yellow mucus. Good and bad days. Wears out easily .  Denies f/c/s, nausea, vomiting.  Did not stop singulair as recommended.  rec  Follow med calendar closely and bring to each visit > did not do  We are  referring you for a CPSTwith spirometry before and after> never done   04/18/2014 NP Follow up and Med Review / NP  Patient returns for a two-week follow-up and medication review. We reviewed all her medications organize them into a medication count with patient education It appears that she is taking her medications correctly, however, she has over 30 medications and has had several changes recently. Last visit, her [name] was decreased to the 166mcg says that her breathing was better on the 231mcg Patient had a chest x-ray last visit that showed no acute changes She underwent a CT sinus due to persistent cough , which was positive for sinusitis on the right She was treated with a ten-day course of Avelox, she has one dose left Does feel some better but still has cough . She denies any hemoptysis, orthopnea, PND or leg swelling  Patient has upcoming sleep study. Next week for suspected underlying sleep apnea with snoring and daytime sleepiness rec Refer to ENT for sinusitis and chronic cough > never saw one> f/u sinus CT neg 07/26/14  Increase Dulera 200 2 puffs Twice daily   Follow med calendar closely and bring to  each visit.     08/01/2014 f/u ov/Erica [name] re:  Asthma plus noct "gasping"  Chief Complaint  Patient presents with  . Follow-up    doing well.  no complaints.  not doing well x one year with noct / early  4am with cough/ gasping for air almost always goes to neb and then can't go back to sleep even 30 min later better Daytime in chest tightness resolves  completely while on prednisone last took one month prior to OV  And doesn't have it now but has exact same noct symptoms and overusing saba  Doe x mailbox and back to house out of breath and coughing  >>change dulera 200 to 100   08/30/2014 NP Follow up and Med review : Asthma Patient presents for a one-month follow-up We reviewed all her medications organize them into a medication count with patient education Patient has had  multiple medication changes recently She has been having difficulty with resistant hypertension and visual changes. Recently started on primidone and spironolactone . However, has not started this as of yet That she needs to go pick this up at the pharmacy. Neuro notes reviewed , has been referred to NS . B/p is elevated again at todays visit, discussed follow up with PCP for b/p management.  She tried to decrease her Dulera as recommended from last ov with Dr. Melvyn Novas    however, was unable to and is back on 200 dose .  Says overall her breathing is doing ok  No flare of cough or wheezing  Does have a lot of drainage in throat . Discussed starting on chlortrimeton.  rec Continue Dulera 200 2 puffs Twice daily  , rinse after use  Follow med calendar closely and bring to each visit.  Follow up with Primary MD concerning elevated blood pressure    10/05/2014 f/u ov/Erica [name] re: asthma with component of uacs/ no med calendar  Chief Complaint  Patient presents with  . Follow-up    Here to discuss meds- Dulera not on her drug formulary. Pt states breathing is doing well and denies any new co's today.     turns out using neb at least once every 24 h, esp between 2 and 4 am when wakes up with severe dry cough x months/ pred helps some but never eliminated noct cough  No obvious day to day or daytime variability or assoc sob or cp or chest tightness, subjective wheeze or overt sinus or hb symptoms. No unusual exp hx or h/o childhood pna/ asthma or knowledge of premature birth.  Sleeping ok without nocturnal  or early am exacerbation  of respiratory  c/o's or need for noct saba. Also denies any obvious fluctuation of symptoms with weather or environmental changes or other aggravating or alleviating factors except as outlined above   Current Medications, Allergies, Complete Past Medical History, Past Surgical History, Family History, and Social History were reviewed in [employer]  record.  ROS  The following are not active complaints unless bolded sore throat, dysphagia, dental problems, itching, sneezing,  nasal congestion or excess/ purulent secretions, ear ache,   fever, chills, sweats, unintended wt loss, classically pleuritic or exertional cp, hemoptysis,  orthopnea pnd or leg swelling, presyncope, palpitations, abdominal pain, anorexia, nausea, vomiting, diarrhea  or change in bowel or bladder habits, change in stools or urine, dysuria,hematuria,  rash, arthralgias, visual complaints, headache, numbness, weakness or ataxia or problems with walking or coordination,  change in mood/affect or memory.  Objective:   Physical Exam  Obese bf nad / vital signs reviewed - note hbp  Wt 213  12/18/10 > 01/03/2011  214 > 01/31/2011  206 >  09/11/2012 209 >208 6/19 > 210 09/25/2012 > 11/20/2012 211 >208 12/07/12> 01/12/2013 209 > 02/09/2013 210 >207 04/06/2013 > 07/14/2013  213>212 07/28/2013 >204 01/26/2014> 04/04/2014 206  >205 04/18/2014 >  07/18/2014 201 >  08/01/14 201 >198 08/30/2014 > 10/05/14  195   HEENT: nl dentition, turbinates, and orophanx. Nl external ear canals without cough reflex, NM swollen    NECK :  without JVD/Nodes/TM/ nl carotid upstrokes bilaterally   LUNGS: lungs clear bilaterally with good air movement   CV:  RRR  no s3 or murmur or increase in P2, no edema   ABD:  soft and nontender with nl excursion in the supine position. No bruits or organomegaly, bowel sounds nl  MS:  warm without deformities, calf tenderness, cyanosis or clubbing     I personally reviewed images and agree with radiology impression as follows:  CXR:  06/08/14  No active cardiopulmonary disease.     Labs reviewed:   Lab 07/28/14 1507  NA 143  K 3.2*  CO2 25  BUN 8.4  CREATININE 0.9  GLUCOSE 152*       Lab 07/28/14 1507  HGB 13.3  HCT 41.1  WBC 5.7  PLT 199     Lab Results  Component Value Date   TSH 1.70 08/01/2014     Lab Results   Component Value Date   PROBNP 16.0 08/01/2014                                                                                                                              Subjective:    Patient ID: Erica [name], female    DOB: 18-Feb-1976   MRN: [id number]    Brief patient profile:  22 yobf with asthma all her life much worse since around 07/2010 referred by Dr Erica [name] to pulmonary clinic in Sept 2012 Hx of Breast Cancer 05/2008    History of Present Illness  12/18/2010 Initial pulmonary office eval on acei and  advair cc dtc asthma x 4 months but baseline = excess saba use "even when better" at least twice daily despite daily advair, worse at hs in fact  used saba 3 x last night.  On chemo /RT for breast ca but last rx was Jan 2012 well before asthma worse.  To er multiple times >  no better with singular added x 3 months. rec Stop Lisinopril Dulera 200 Take 2 puffs first thing in am and then another 2 puffs about 12 hours later.  Work on inhaler technique  Nexium 40 mg   Take 30-60 min before first meal of the day and Pepcid 20 mg one bedtime until return Only use your albuterol as a rescue medication to be used if you can't catch your breath by resting or doing  a relaxed purse lip breathing pattern. The less you use it, the better it will work when you need it.  Prednisone 10 mg take  4 each am x 2 days,   2 each am x 2 days,  1 each am x2days and stop    01/03/2011 f/u ov/Anessa Charley cc much better off ace and advair but still needing saba twice daily on avg. No  Purulent sputum. rec no change in rx/ rule of 2's reviewed     04/06/2013 NP Follow up and Med Review   We reviewed all her meds and organized them into a med calendar. She does have Medicaid but depends on samples. Did not bring all her meds with her today , says they are at home.  Says her breathing is the same  since last ov w/ SOB, Has on /off wheezing, some chest heaviness, prod cough with yellow mucus. Good and bad days. Wears out easily .  Denies f/c/s, nausea, vomiting.  Did not stop singulair as recommended.  rec  Follow med calendar closely and bring to each visit > did not do  We are referring you for a CPSTwith spirometry before and after> never done   04/18/2014 NP Follow up and Med Review / NP  Patient returns for a two-week follow-up and medication review. We reviewed all her medications organize them into a medication count with patient education It appears that she is taking her medications correctly, however, she has over 30 medications and has had several changes recently. Last visit, her [name] was decreased to the 196mcg says that her breathing was better on the 242mcg Patient had a chest x-ray last visit that showed no acute changes She underwent a CT sinus due to persistent cough , which was positive for sinusitis on the right She was treated with a ten-day course of Avelox, she has one dose left Does feel some better but still has cough . She denies any hemoptysis, orthopnea, PND or leg swelling  Patient has upcoming sleep study. Next week for suspected underlying sleep apnea with snoring and daytime sleepiness rec Refer to ENT for sinusitis and chronic cough > never saw one> f/u sinus CT neg 07/26/14  Increase Dulera 200 2 puffs Twice daily   Follow med calendar closely and bring to each visit.     08/01/2014 f/u ov/Twanna Resh re:  Asthma plus noct "gasping"  Chief Complaint  Patient presents with  . Follow-up    doing well.  no complaints.  not doing well x one year with noct / early  4am with cough/ gasping for air almost always goes to neb and then can't go back to sleep even 30 min later better Daytime in chest tightness resolves  completely while on prednisone last took one month prior to OV  And doesn't have it now but has exact same noct symptoms and overusing saba  Doe x  mailbox and back to house out of breath and coughing  >>change dulera 200 to 100   08/30/2014 NP Follow up and Med review : Asthma Patient presents for a one-month follow-up We reviewed all her medications organize them into a medication count with patient education Patient has had multiple medication changes recently She has been having difficulty with resistant hypertension and visual changes. Recently started on primidone and spironolactone . However, has not started this as of yet That she needs to go pick this up at the pharmacy. Neuro notes reviewed , has been referred to NS . B/p is  elevated again at todays visit, discussed follow up with PCP for b/p management.  She tried to decrease her Dulera as recommended from last ov with Dr. Melvyn Novas    however, was unable to and is back on 200 dose .  Says overall her breathing is doing ok  No flare of cough or wheezing  Does have a lot of drainage in throat . Discussed starting on chlortrimeton.  rec Continue Dulera 200 2 puffs Twice daily  , rinse after use  Follow med calendar closely and bring to each visit.  Follow up with Primary MD concerning elevated blood pressure    10/05/2014 f/u ov/Dorance Spink re: asthma with component of uacs/ no med calendar  Chief Complaint  Patient presents with  . Follow-up    Here to discuss meds- Dulera not on her drug formulary. Pt states breathing is doing well and denies any new co's today.   turns out using neb at least once every 24 h, esp between 2 and 4 am when wakes up with severe dry cough x months/ pred helps some but never eliminated noct cough rec symbicort 160 Take 2 puffs first thing in am and then another 2 puffs about 12 hours later.  Try taking the tramadol around 2 am to see if helps with the early am cough/ wheeze  See calendar for specific medication instructions and bring it back for each and every office visit  Separating the top medications from the bottom group is fundamental to providing  you adequate care going forward.   Please schedule a follow up office visit in 2 weeks, sooner if needed with all medications in 2 separate bags    10/19/2014 f/u ov/Avrey Hyser re:  Asthma vs uacs/ not using med calendar correclty  Chief Complaint  Patient presents with  . Follow-up    Cough has changed sicnce starting symbicort- prod cough with "dirty" sputum. She states she feels that she can tell when her PM dose is due b/c she starts feeling SOB. She is using proair once per day on average.   not using symbicort 160 correctly    No obvious day to day or daytime variability or assoc sob or cp or chest tightness, subjective wheeze or overt sinus or hb symptoms. No unusual exp hx or h/o childhood pna/ asthma or knowledge of premature birth.  Sleeping ok without nocturnal  or early am exacerbation  of respiratory  c/o's or need for noct saba. Also denies any obvious fluctuation of symptoms with weather or environmental changes or other aggravating or alleviating factors except as outlined above   Current Medications, Allergies, Complete Past Medical History, Past Surgical History, Family History, and Social History were reviewed in [employer] record.  ROS  The following are not active complaints unless bolded sore throat, dysphagia, dental problems, itching, sneezing,  nasal congestion or excess/ purulent secretions, ear ache,   fever, chills, sweats, unintended wt loss, classically pleuritic or exertional cp, hemoptysis,  orthopnea pnd or leg swelling, presyncope, palpitations, abdominal pain, anorexia, nausea, vomiting, diarrhea  or change in bowel or bladder habits, change in stools or urine, dysuria,hematuria,  rash, arthralgias, visual complaints, headache, numbness, weakness or ataxia or problems with walking or coordination,  change in mood/affect or memory.             Objective:   Physical Exam  Obese bf nad quite hoarse / vital signs reviewed - note hbp  Wt  213  12/18/10 > 01/03/2011  214 >  01/31/2011  206 >  09/11/2012 209 >208 6/19 > 210 09/25/2012 > 11/20/2012 211 >208 12/07/12> 01/12/2013 209 > 02/09/2013 210 >207 04/06/2013 > 07/14/2013  213>212 07/28/2013 >204 01/26/2014> 04/04/2014 206  >205 04/18/2014 >  07/18/2014 201 >  08/01/14 201 >198 08/30/2014 > 10/05/14  195 > 10/19/2014 192   HEENT: nl dentition, turbinates, and orophanx. Nl external ear canals without cough reflex,     NECK :  without JVD/Nodes/TM/ nl carotid upstrokes bilaterally   LUNGS: lungs clear bilaterally with good air movement though short Ti   CV:  RRR  no s3 or murmur or increase in P2, no edema   ABD:  soft and nontender with nl excursion in the supine position. No bruits or organomegaly, bowel sounds nl  MS:  warm without deformities, calf tenderness, cyanosis or clubbing     I personally reviewed images and agree with radiology impression as follows:  CXR:  06/08/14  No active cardiopulmonary disease.     Labs reviewed:   Lab 07/28/14 1507  NA 143  K 3.2*  CO2 25  BUN 8.4  CREATININE 0.9  GLUCOSE 152*       Lab 07/28/14 1507  HGB 13.3  HCT 41.1  WBC 5.7  PLT 199     Lab Results  Component Value Date   TSH 1.70 08/01/2014     Lab Results  Component Value Date   PROBNP 16.0 08/01/2014

## 2014-10-19 NOTE — Patient Instructions (Addendum)
Prednisone 10 mg take  4 each am x 2 days,  2 each am x 2 days,  1 each am x 2 days and stop   Bystolic 10 mg take twice daily until return   Take 2 chlortrimeton at bedtime and 2 more 4 hours later   Continue symbicort 160 Take 2 puffs first thing in am and then another 2 puffs about 12 hours later.   Only use your albuterol (proventil)  as a rescue medication to be used if you can't catch your breath by resting or doing a relaxed purse lip breathing pattern.  - The less you use it, the better it will work when you need it. - Ok to use up to 2 puffs  every 4 hours if you must but call for immediate appointment if use goes up over your usual need - Don't leave home without it !!  (think of it like the spare tire for your car)   Only use nebulizer if you try proventil first and it fails to work  Please schedule a follow up office visit in 2 weeks, sooner if needed with all your medications in two bags and your calendar to make sure they match

## 2014-10-20 ENCOUNTER — Ambulatory Visit (HOSPITAL_BASED_OUTPATIENT_CLINIC_OR_DEPARTMENT_OTHER): Payer: Medicare Other

## 2014-10-20 ENCOUNTER — Ambulatory Visit (INDEPENDENT_AMBULATORY_CARE_PROVIDER_SITE_OTHER): Payer: Medicare Other | Admitting: Internal Medicine

## 2014-10-20 ENCOUNTER — Ambulatory Visit (HOSPITAL_BASED_OUTPATIENT_CLINIC_OR_DEPARTMENT_OTHER): Payer: Medicare Other | Admitting: Oncology

## 2014-10-20 ENCOUNTER — Encounter: Payer: Self-pay | Admitting: Internal Medicine

## 2014-10-20 ENCOUNTER — Telehealth: Payer: Self-pay | Admitting: Oncology

## 2014-10-20 VITALS — BP 194/110 | HR 97 | Temp 98.2°F | Ht 66.0 in | Wt 193.0 lb

## 2014-10-20 VITALS — BP 178/104 | HR 93 | Temp 98.4°F | Resp 18 | Ht 66.0 in | Wt 192.7 lb

## 2014-10-20 DIAGNOSIS — N939 Abnormal uterine and vaginal bleeding, unspecified: Secondary | ICD-10-CM

## 2014-10-20 DIAGNOSIS — I10 Essential (primary) hypertension: Secondary | ICD-10-CM

## 2014-10-20 DIAGNOSIS — Z5111 Encounter for antineoplastic chemotherapy: Secondary | ICD-10-CM | POA: Diagnosis present

## 2014-10-20 DIAGNOSIS — I5032 Chronic diastolic (congestive) heart failure: Secondary | ICD-10-CM | POA: Diagnosis not present

## 2014-10-20 DIAGNOSIS — C50412 Malignant neoplasm of upper-outer quadrant of left female breast: Secondary | ICD-10-CM

## 2014-10-20 DIAGNOSIS — C50919 Malignant neoplasm of unspecified site of unspecified female breast: Secondary | ICD-10-CM

## 2014-10-20 MED ORDER — NEBIVOLOL HCL 20 MG PO TABS: ORAL_TABLET | ORAL | Status: DC

## 2014-10-20 MED ORDER — AMLODIPINE BESYLATE 10 MG PO TABS: 10.0000 mg | ORAL_TABLET | Freq: Every day | ORAL | Status: DC

## 2014-10-20 MED ORDER — GOSERELIN ACETATE 3.6 MG ~~LOC~~ IMPL
3.6000 mg | DRUG_IMPLANT | Freq: Once | SUBCUTANEOUS | Status: AC
Start: 2014-10-20 — End: 2014-10-20
  Administered 2014-10-20: 3.6 mg via SUBCUTANEOUS
  Filled 2014-10-20: qty 3.6

## 2014-10-20 MED ORDER — HYDRALAZINE HCL 50 MG PO TABS: 50.0000 mg | ORAL_TABLET | Freq: Three times a day (TID) | ORAL | Status: DC

## 2014-10-20 NOTE — Progress Notes (Signed)
ID: Erica [name]   DOB: 1975/11/06  MR#: [id number]  XBM#:[id number]  [id number] [name], MD GYN: SU:  Erroll Luna, MD Other:  Christinia Gully, MD;  Zenovia Jarred, MD;  Ena Dawley, MD, Luberta Mutter MD, Everitt Amber MD  CHIEF COMPLAINT:  Triple negative Breast Cancer  CURRENT TREATMENT: Observation  BREAST CANCER HISTORY: From the original intake note:  Erica [name] noticed a lump in her left breast sometime in August or September 2009.  Initially this was thought to possibly be "dry breast milk" but as the mass got bigger, she brought it to her physician's attention and was set up for mammography.  This was performed May 20, 2008 at [facility] and it showed a palpable 8.5 cm oval circumscribed mass in the left breast upper outer quadrant.  There were prominent left axillary lymph nodes noted as well.  An ultrasound showed this to be hypoechoic, with lobulated margins and probable central necrosis measuring up to 7.3 cm on this modality.  Ultrasound of the left axilla demonstrated numerous prominent lymph nodes at the upper limit of normal, the largest measuring 1 cm with a normal appearing thin cortex and a normal cortex to halo ratio.    Because the patient had no insurance, she was referred to [facility] and biopsy was performed in [place] with the pathology there ([id number]) showing a poorly differentiated malignancy which was triple negative (ER 1%, PR 0%, HER-2/neu 0).  The axillary lymph nodes were not biopsied. With this information, the patient was presented at the Breast Multidisciplinary Conference and the feeling was that, after appropriate staging, the patient would be a good candidate for neoadjuvant treatment and possibly NSABP B40.    Her subsequent history is as detailed below  INTERVAL HISTORY: Erica [name] returns today for follow-up of her breast cancer. She had mammography in February which was unremarkable. She tells me that she had to be admitted in June because her "kidneys  shut down". The problem seems to have been the malignant hypertension, and today her blood pressure is 178/104, all with 190/105 on repeat. She is on Diamox, Catapres, Cardizem, Lasix, Imdur, and biastolic.  REVIEW OF SYSTEMS: She sees Dr. Ellie Lunch regarding her visual field loss and Dr. Melvyn Novas regarding her breathing. A detailed review of systems is diffusely positive and she is having problems with night sweats, insomnia, fatigue, pain in her hands, legs, feet, and left breast, which she describes as throbbing, aching, and cramping. She also has back pain, which is fairly chronic. She is hoarse. Her ankles swell. She is short of breath and keeps a cough which is occasionally productive. Her appetite is poor. Sometimes she has nausea and vomiting problems patient has abdominal pain. She bruises easily. She has difficulty walking. She has headaches, feels weak, has been fainty, is forgetful, anxious, and depressed. She is having hot flashes. A detailed review of systems was otherwise stable.  PAST MEDICAL HISTORY: Past Medical History  Diagnosis Date  . Asthma   . Hypertension   . GERD (gastroesophageal reflux disease)   . Impaired glucose tolerance 04/13/2011  . Anemia, unspecified 04/13/2011  . Asthma 09/27/2010  . Bloating 03/18/2011  . 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 Lymphadenitis, chronic     restricted LEFT extremity  . Dyspnea on exertion     a. 01/2013 Lexi MV: EF 54%, no ischemia/infarct;  b. 05/2013 Echo: EF 60-65%, no rwma, Gr 2 DD;  b.   . OSA (obstructive sleep apnea) 05/04/2014    AHI 31/hr  now on CPAP at 12cm H2O  . Breast cancer 2010    a. locally advanced left breast carcinoma diagnosed in 2010 and treated with neoadjuvant chemotherapy with docetaxel and cyclophosphamide as well as paclitaxel. This was followed by radiation therapy which was completed in 2011.  . Fatty liver   . Gastroparesis     PAST SURGICAL HISTORY: Past Surgical History  Procedure Laterality Date  .  Tendon release Right 2012    "hand"  . Breast lumpectomy Left 09/2008  . Carpal tunnel release Left 2011  . Tubal ligation  05/11/2011    Procedure: POST PARTUM TUBAL LIGATION;  Surgeon: Emeterio Reeve, MD;  Location: [facility];  Service: Gynecology;  Laterality: Bilateral;  Induced for HTN  . Lymph node dissection Left 2010    breast; 2 wks after breast lumpectomy    FAMILY HISTORY Family History  Problem Relation Age of Onset  . Diabetes Maternal Grandmother   . Heart disease Maternal Grandmother   . Hypertension Maternal Grandmother   . Cancer Neg Hx   . Alcohol abuse Neg Hx   . Early death Neg Hx   . Hyperlipidemia Neg Hx   . Kidney disease Neg Hx   . Stroke Neg Hx   . Colon cancer Neg Hx   The patient's parents are alive. The patient has one brother and one sister.  There is no ovarian or breast cancer in the immediate family, but the patient's maternal grandmother, who was one of 48 sisters, had one sister with breast cancer diagnosed in her 32.    GYNECOLOGIC HISTORY: updated January 2015 The patient is GX P4, first pregnancy to term at age 39.  She stopped having periods at the time of chemotherapy, and had had no periods for  2 years, then became pregnant in 2012. This was a surprise to her, and she was not aware of the pregnancy until the seventh month. She has not resumed normal menses, but has had some "spotting" on and off for the last several months.  SOCIAL HISTORY: updated  January 2015  She has been a Dance movement psychotherapist in the past. She's currently disabled. she splits her time between [place] and [place], [place]. She's currently single. Erica [name] has four children,  currently 45 year old, 72 years old, 69 and 41 years old. Her oldest daughter is attending college in [place].   Her youngest son is almost 2. 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 Smoking status:  Never Smoker   . Smokeless tobacco: Never Used  . Alcohol Use: 0.0 oz/week     Comment: occasionally     Colonoscopy: Never  PAP: June 2013  Bone density:  Never  Lipid panel: Nov 2014, Dr. Meda Coffee   Allergies  Allergen Reactions  . Aspirin Shortness Of Breath and Palpitations    Pt can take ibuprofen without reaction  . Doxycycline Anaphylaxis  . Penicillins Shortness Of Breath and Palpitations  . Kiwi Extract Itching and Swelling    Lip swelling  . Strawberry Hives    Current Outpatient Prescriptions  Medication Sig Dispense Refill  . acetaZOLAMIDE (DIAMOX) 250 MG tablet Take 4 tablets (1,000 mg total) by mouth 3 (three) times daily. (Patient taking differently: Take 250 mg by mouth 3 (three) times daily. ) 400 tablet 5  . albuterol (PROVENTIL HFA;VENTOLIN HFA) 108 (90 BASE) MCG/ACT inhaler Inhale 2 puffs into  the lungs every 4 (four) hours as needed for wheezing or shortness of breath (((PLAN A))). 18 g 5  . albuterol (PROVENTIL) (2.5 MG/3ML) 0.083% nebulizer solution Take 3 mLs (2.5 mg total) by nebulization every 4 (four) hours as needed for wheezing or shortness of breath (((PLAN B))). 300 mL 5  . Biotin 10 MG TABS Take 1 tablet by mouth daily.    . budesonide-formoterol (SYMBICORT) 160-4.5 MCG/ACT inhaler Take 2 puffs first thing in am and then another 2 puffs about 12 hours later. 1 Inhaler 12  . chlorpheniramine (CHLOR-TRIMETON) 4 MG tablet Take 8 mg by mouth at bedtime.    . cloNIDine (CATAPRES) 0.3 MG tablet Take 1 tablet (0.3 mg total) by mouth 2 (two) times daily. 90 tablet 3  . dextromethorphan-guaiFENesin (MUCINEX DM) 30-600 MG per 12 hr tablet Take 2 tablets by mouth 2 (two) times daily as needed for cough.    . diltiazem (CARDIZEM CD) 240 MG 24 hr capsule Take 240 mg by mouth daily.    Marland Kitchen EPINEPHrine 0.3 mg/0.3 mL IJ SOAJ injection Inject 0.3 mLs (0.3 mg total) into the muscle once. 2 Device 1  . furosemide (LASIX) 40 MG tablet Take 40 mg by mouth daily as needed.     . hyoscyamine (LEVSIN SL) 0.125 MG SL tablet Place 1 tablet (0.125 mg total) under the tongue every 8 (eight) hours as needed for cramping. (Patient taking differently: Place 0.125 mg under the tongue 3 (three) times daily. ) 30 tablet 2  . isosorbide mononitrate (IMDUR) 60 MG 24 hr tablet Take 1 tablet (60 mg total) by mouth daily. 30 tablet 11  . LORazepam (ATIVAN) 0.5 MG tablet Take 0.5 mg by mouth at bedtime as needed for anxiety.    . metoCLOPramide (REGLAN) 10 MG tablet TAKE 1 TABLET BY MOUTH FOUR TIMES DAILY WITH MEALS AND AT BEDTIME 120 tablet 0  . mometasone (NASONEX) 50 MCG/ACT nasal spray Place 2 sprays into the nose 2 (two) times daily.    . montelukast (SINGULAIR) 10 MG tablet Take 1 tablet (10 mg total) by mouth at bedtime. 30 tablet 5  . nebivolol (BYSTOLIC) 10 MG tablet One twice daily 90 tablet 3  . nitroGLYCERIN (NITROSTAT) 0.4 MG SL tablet Place 1 tablet (0.4 mg total) under the tongue every 5 (five) minutes as needed for chest pain. 90 tablet 3  . ondansetron (ZOFRAN-ODT) 4 MG disintegrating tablet Take 1 tablet (4 mg total) by mouth every 6 (six) hours as needed for nausea or vomiting. 20 tablet 5  . pantoprazole (PROTONIX) 40 MG tablet Take 1 tablet (40 mg total) by mouth 2 (two) times daily before a meal. 60 tablet 3  . potassium chloride SA (K-DUR,KLOR-CON) 20 MEQ tablet Take 20 mEq by mouth 3 (three) times daily.    . predniSONE (DELTASONE) 10 MG tablet Take  4 each am x 2 days,   2 each am x 2 days,  1 each am x 2 days and stop 14 tablet 0  . pregabalin (LYRICA) 200 MG capsule Take 1 capsule (200 mg total) by mouth 3 (three) times daily. 90 capsule 5  . Respiratory Therapy Supplies (FLUTTER) DEVI Use as directed. 1 each 0  . sucralfate (CARAFATE) 1 G tablet Take 1 g by mouth 4 (four) times daily.    . traMADol (ULTRAM) 50 MG tablet 1-2 every 4 hours as needed for cough or pain 30 tablet 0   No current facility-administered medications for this visit.    OBJECTIVE:  Young African-American woman who appears stated age 64 Vitals:   10/20/14 0946  BP: 178/104  Pulse: 93  Temp: 98.4 F (36.9 C)  Resp: 18     Body mass index is 31.12 kg/(m^2).    ECOG FS: 2 Filed Weights   10/20/14 0946  Weight: 192 lb 11.2 oz (87.408 kg)   Sclerae unicteric, EOMs intact Oropharynx clear, slightly dry No cervical or supraclavicular adenopathy Lungs no rales or rhonchi Heart regular rate and rhythm Abd soft, obese, nontender, positive bowel sounds MSK no focal spinal tenderness, no upper extremity lymphedema Neuro: nonfocal, well oriented, appropriate affect Breasts: The right breast is unremarkable. The left breast is status post lumpectomy and radiation. There is no evidence of local recurrence. Left axilla is benign.    LAB RESULTS: Lab Results  Component Value Date   WBC 8.0 09/22/2014   NEUTROABS 6.3 09/20/2014   HGB 12.4 09/22/2014   HCT 36.3 09/22/2014   MCV 86.2 09/22/2014   PLT 242 09/22/2014      Chemistry      Component Value Date/Time   NA 142 09/29/2014 1359   NA 143 07/28/2014 1507   K 3.9 09/29/2014 1359   K 3.2* 07/28/2014 1507   CL 113* 09/29/2014 1359   CL 103 09/10/2012 1618   CO2 23 09/29/2014 1359   CO2 25 07/28/2014 1507   BUN 17 09/29/2014 1359   BUN 8.4 07/28/2014 1507   CREATININE 1.07 09/29/2014 1359   CREATININE 0.9 07/28/2014 1507   CREATININE 0.64 05/02/2011 1021      Component Value Date/Time   CALCIUM 9.3 09/29/2014 1359   CALCIUM 9.3 07/28/2014 1507   ALKPHOS 53 09/20/2014 1549   ALKPHOS 62 07/28/2014 1507   AST 28 09/20/2014 1549   AST 18 07/28/2014 1507   ALT 27 09/20/2014 1549   ALT 23 07/28/2014 1507   BILITOT 0.5 09/20/2014 1549   BILITOT 0.51 07/28/2014 1507        STUDIES: Ct Head Wo Contrast  09/20/2014   CLINICAL DATA:  39 year old female with altered mental status.  EXAM: CT HEAD WITHOUT CONTRAST  TECHNIQUE: Contiguous axial images were obtained from the base of the skull through the  vertex without intravenous contrast.  COMPARISON:  09/18/2014 head CT and prior exams  FINDINGS: No intracranial abnormalities are identified, including mass lesion or mass effect, hydrocephalus, extra-axial fluid collection, midline shift, hemorrhage, or acute infarction.  The visualized bony calvarium is unremarkable.  IMPRESSION: Unremarkable noncontrast head CT.   Electronically Signed   By: Margarette Canada M.D.   On: 09/20/2014 16:55   Dg Chest Port 1 View  09/21/2014   CLINICAL DATA:  Patient status post central line placement.  EXAM: PORTABLE CHEST - 1 VIEW  COMPARISON:  09/20/2014  FINDINGS: Patient rotated to the right. Interval insertion of right internal jugular central venous catheter with tip projecting over the expected location of the superior vena cava. Stable enlarged cardiac and mediastinal contours. Monitoring leads overlie the patient. Minimal bibasilar heterogeneous opacities.  IMPRESSION: Right IJ central venous catheter tip projects over the expected location of the superior vena cava.   Electronically Signed   By: Lovey Newcomer M.D.   On: 09/21/2014 00:26   Dg Chest Port 1 View  09/20/2014   CLINICAL DATA:  Confusion  EXAM: PORTABLE CHEST - 1 VIEW  COMPARISON:  09/18/2014  FINDINGS: Hypoventilation with decreased lung polyp thin mild left lower lobe atelectasis. No definite pneumonia or heart failure. Cardiac enlargement, partially  due to low lung volumes.  IMPRESSION: Hypoventilation with mild left lower lobe atelectasis.   Electronically Signed   By: Franchot Gallo M.D.   On: 09/20/2014 15:49     ASSESSMENT: 39 y.o. [place] woman with history of locally advanced left breast carcinoma initially presenting in March 2010   (1) treated neoadjuvantly with 4 cycles of docetaxel/ cyclophosphamide.    (2) status post left lumpectomy and sentinel lymph node dissection in July 2010 for a ypT2 ypN1, stage IIB invasive ductal carcinoma, grade 3, triple negative, with MIB-1 of 88%.   (3)  status post full axillary dissection and margin clearance in August 2010.   (4) status post 11 doses of weekly paclitaxel completed in December 2010   (5) radiation therapy completed in March 2011.  Now followed with observation alone.   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 She is on several blood pressure medications already. I think he would be useful if she could see her primary care physician today for further adjustment and we have arranged for a visit later this morning.  She tells me she still having spotting despite receiving goserelin every month. That is certainly not impossible, but it does raise some concern. At this point what Dagny really wants [place] to have her ovaries removed. I'm referring her to Dr. Denman George both for bilateral salpingo-oophorectomy and for evaluation of the vaginal bleeding.  From a breast cancer point of view Kirstan is doing very well, with no evidence of disease recurrence. This is particularly positive since triple negative cases if they recurred 10 to recur early. She is going to see our survivorship nurse in March after her February mammogram. She will see me again in September of next year. She knows to call for any other problems that may develop before her next visit here. Chauncey Cruel, MD    10/20/2014

## 2014-10-20 NOTE — Progress Notes (Signed)
Pre visit review using our clinic review tool, if applicable. No additional management support is needed unless otherwise documented below in the visit note. 

## 2014-10-20 NOTE — Patient Instructions (Addendum)
OK to stop the bystolic 10 mg  Please take all new medication as prescribed - the bystolic 20 mg, in addition to the new hydralazine 50 mg three times per day   The next step would be to consider cutting back or stopping the clonidine to see if this helps the dry mouth  Please continue all other medications as before  Please have the pharmacy call with any other refills you may need.  Please continue your efforts at being more active, low cholesterol diet, and weight control.  Please keep your appointments with your specialists as you may have planned  Your FMLA for your mother will be filled out

## 2014-10-20 NOTE — Telephone Encounter (Signed)
Gave avs & calendar for August through January. Sent message to schedule survivorship

## 2014-10-20 NOTE — Progress Notes (Signed)
Subjective:    Patient ID: Erica [name], female    DOB: 25-Jun-1975, 39 y.o.   MRN: [id number]  HPI  Here to f/u, unfortunately recently mult BP's have been elevated despite overall good med compliance, assoc with freq unusual generalized HA's. . Pt denies chest pain, increased sob or doe, wheezing, orthopnea, PND, increased LE swelling, palpitations, dizziness or syncope.  Pt denies new neurological symptoms such as facial or extremity weakness or numbness   Pt denies polydipsia, polyuria,  BP Readings from Last 3 Encounters:  10/20/14 194/110  10/20/14 178/104  10/19/14 168/104   Pt denies fever, wt loss, night sweats, loss of appetite, or other constitutional symptoms  Does have some dry mouth persistent on clonidine as well. Past Medical History  Diagnosis Date  . Asthma   . Hypertension   . GERD (gastroesophageal reflux disease)   . Impaired glucose tolerance 04/13/2011  . Anemia, unspecified 04/13/2011  . Asthma 09/27/2010  . Bloating 03/18/2011  . 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 Lymphadenitis, chronic     restricted LEFT extremity  . Dyspnea on exertion     a. 01/2013 Lexi MV: EF 54%, no ischemia/infarct;  b. 05/2013 Echo: EF 60-65%, no rwma, Gr 2 DD;  b.   . OSA (obstructive sleep apnea) 05/04/2014    AHI 31/hr now on CPAP at 12cm H2O  . Breast cancer 2010    a. locally advanced left breast carcinoma diagnosed in 2010 and treated with neoadjuvant chemotherapy with docetaxel and cyclophosphamide as well as paclitaxel. This was followed by radiation therapy which was completed in 2011.  . Fatty liver   . Gastroparesis    Past Surgical History  Procedure Laterality Date  . Tendon release Right 2012    "hand"  . Breast lumpectomy Left 09/2008  . Carpal tunnel release Left 2011  . Tubal ligation  05/11/2011    Procedure: POST PARTUM TUBAL LIGATION;  Surgeon: Emeterio Reeve, MD;  Location: [facility];  Service: Gynecology;  Laterality: Bilateral;  Induced for HTN  . Lymph node  dissection Left 2010    breast; 2 wks after breast lumpectomy    reports that she has never smoked. She has never used smokeless tobacco. She reports that she drinks alcohol. She reports that she does not use illicit drugs. family history includes Diabetes in her maternal grandmother; Heart disease in her maternal grandmother; Hypertension in her maternal grandmother. There is no history of Cancer, Alcohol abuse, Early death, Hyperlipidemia, Kidney disease, Stroke, or Colon cancer. Allergies  Allergen Reactions  . Aspirin Shortness Of Breath and Palpitations    Pt can take ibuprofen without reaction  . Doxycycline Anaphylaxis  . Penicillins Shortness Of Breath and Palpitations  . Kiwi Extract Itching and Swelling    Lip swelling  . Strawberry Hives   Current Outpatient Prescriptions on File Prior to Visit  Medication Sig Dispense Refill  . acetaZOLAMIDE (DIAMOX) 250 MG tablet Take 4 tablets (1,000 mg total) by mouth 3 (three) times daily. (Patient taking differently: Take 250 mg by mouth 3 (three) times daily. ) 400 tablet 5  . albuterol (PROVENTIL HFA;VENTOLIN HFA) 108 (90 BASE) MCG/ACT inhaler Inhale 2 puffs into the lungs every 4 (four) hours as needed for wheezing or shortness of breath (((PLAN A))). 18 g 5  . albuterol (PROVENTIL) (2.5 MG/3ML) 0.083% nebulizer solution Take 3 mLs (2.5 mg total) by nebulization every 4 (four) hours as needed for wheezing or shortness of breath (((PLAN B))). 300 mL 5  .  Biotin 10 MG TABS Take 1 tablet by mouth daily.     . budesonide-formoterol (SYMBICORT) 160-4.5 MCG/ACT inhaler Take 2 puffs first thing in am and then another 2 puffs about 12 hours later. 1 Inhaler 12  . chlorpheniramine (CHLOR-TRIMETON) 4 MG tablet Take 8 mg by mouth at bedtime.    . cloNIDine (CATAPRES) 0.3 MG tablet Take 1 tablet (0.3 mg total) by mouth 2 (two) times daily. 90 tablet 3  . dextromethorphan-guaiFENesin (MUCINEX DM) 30-600 MG per 12 hr tablet Take 2 tablets by mouth 2  (two) times daily as needed for cough.    . diltiazem (CARDIZEM CD) 240 MG 24 hr capsule Take 240 mg by mouth daily.    Marland Kitchen EPINEPHrine 0.3 mg/0.3 mL IJ SOAJ injection Inject 0.3 mLs (0.3 mg total) into the muscle once. 2 Device 1  . furosemide (LASIX) 40 MG tablet Take 40 mg by mouth daily as needed.    . hyoscyamine (LEVSIN SL) 0.125 MG SL tablet Place 1 tablet (0.125 mg total) under the tongue every 8 (eight) hours as needed for cramping. (Patient taking differently: Place 0.125 mg under the tongue 3 (three) times daily. ) 30 tablet 2  . isosorbide mononitrate (IMDUR) 60 MG 24 hr tablet Take 1 tablet (60 mg total) by mouth daily. 30 tablet 11  . LORazepam (ATIVAN) 0.5 MG tablet Take 0.5 mg by mouth at bedtime as needed for anxiety.    . metoCLOPramide (REGLAN) 10 MG tablet TAKE 1 TABLET BY MOUTH FOUR TIMES DAILY WITH MEALS AND AT BEDTIME 120 tablet 0  . mometasone (NASONEX) 50 MCG/ACT nasal spray Place 2 sprays into the nose 2 (two) times daily.    . montelukast (SINGULAIR) 10 MG tablet Take 1 tablet (10 mg total) by mouth at bedtime. 30 tablet 5  . nitroGLYCERIN (NITROSTAT) 0.4 MG SL tablet Place 1 tablet (0.4 mg total) under the tongue every 5 (five) minutes as needed for chest pain. 90 tablet 3  . ondansetron (ZOFRAN-ODT) 4 MG disintegrating tablet Take 1 tablet (4 mg total) by mouth every 6 (six) hours as needed for nausea or vomiting. 20 tablet 5  . pantoprazole (PROTONIX) 40 MG tablet Take 1 tablet (40 mg total) by mouth 2 (two) times daily before a meal. 60 tablet 3  . potassium chloride SA (K-DUR,KLOR-CON) 20 MEQ tablet Take 20 mEq by mouth 3 (three) times daily.    . predniSONE (DELTASONE) 10 MG tablet Take  4 each am x 2 days,   2 each am x 2 days,  1 each am x 2 days and stop 14 tablet 0  . pregabalin (LYRICA) 200 MG capsule Take 1 capsule (200 mg total) by mouth 3 (three) times daily. 90 capsule 5  . Respiratory Therapy Supplies (FLUTTER) DEVI Use as directed. 1 each 0  . sucralfate  (CARAFATE) 1 G tablet Take 1 g by mouth 4 (four) times daily.    . traMADol (ULTRAM) 50 MG tablet 1-2 every 4 hours as needed for cough or pain 30 tablet 0   No current facility-administered medications on file prior to visit.   Review of Systems  Constitutional: Negative for unusual diaphoresis or night sweats HENT: Negative for ringing in ear or discharge Eyes: Negative for double vision or worsening visual disturbance.  Respiratory: Negative for choking and stridor.   Gastrointestinal: Negative for vomiting or other signifcant bowel change Genitourinary: Negative for hematuria or change in urine volume.  Musculoskeletal: Negative for other MSK pain or swelling Skin:  Negative for color change and worsening wound.  Neurological: Negative for tremors and numbness other than noted  Psychiatric/Behavioral: Negative for decreased concentration or agitation other than above       Objective:   Physical Exam BP 194/110 mmHg  Pulse 97  Temp(Src) 98.2 F (36.8 C) (Oral)  Ht 5\' 6"  (1.676 m)  Wt 193 lb (87.544 kg)  BMI 31.17 kg/m2  SpO2 99% VS noted,  Constitutional: Pt appears in no significant distress HENT: Head: NCAT.  Right Ear: External ear normal.  Left Ear: External ear normal.  Eyes: . Pupils are equal, round, and reactive to light. Conjunctivae and EOM are normal Neck: Normal range of motion. Neck supple.  Cardiovascular: Normal rate and regular rhythm.   Pulmonary/Chest: Effort normal and breath sounds without rales or wheezing.  Abd:  Soft, NT, ND, + BS Neurological: Pt is alert. Not confused , motor grossly intact Skin: Skin is warm. No rash, no LE edema Psychiatric: Pt behavior is normal. No agitation.     Assessment & Plan:

## 2014-10-21 ENCOUNTER — Encounter: Payer: Self-pay | Admitting: Neurology

## 2014-10-21 ENCOUNTER — Ambulatory Visit (INDEPENDENT_AMBULATORY_CARE_PROVIDER_SITE_OTHER): Payer: Medicare Other | Admitting: Neurology

## 2014-10-21 VITALS — BP 180/104 | HR 87 | Ht 66.0 in | Wt 191.5 lb

## 2014-10-21 DIAGNOSIS — T451X5A Adverse effect of antineoplastic and immunosuppressive drugs, initial encounter: Secondary | ICD-10-CM | POA: Diagnosis not present

## 2014-10-21 DIAGNOSIS — G932 Benign intracranial hypertension: Secondary | ICD-10-CM

## 2014-10-21 DIAGNOSIS — G620 Drug-induced polyneuropathy: Secondary | ICD-10-CM

## 2014-10-21 DIAGNOSIS — G250 Essential tremor: Secondary | ICD-10-CM | POA: Diagnosis not present

## 2014-10-21 HISTORY — DX: Benign intracranial hypertension: G93.2

## 2014-10-21 NOTE — Assessment & Plan Note (Signed)
Severe uncontrolled despite mult meds with freq HA's, for increased bystolic to 20 mg and add hydralazine 50 tid, consider also decrease or stop the clonidine with the dry mouth symptoms, f/u BP at home and next visit BP Readings from Last 3 Encounters:  10/21/14 180/104  10/20/14 194/110  10/20/14 178/104

## 2014-10-21 NOTE — Progress Notes (Signed)
Follow-up Visit   Date: 10/24/2014    Erica [name] MRN: [id number] DOB: [birth year]   Interim History: Erica [name] is a 39 y.o. 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    History of present illness: Starting in 2010 after her second course of chemotherapy she developed numbness/tingling of the arm and legs, described as her hands are asleep. She has burning sensation and shaking of her hands. Activity worsens pain and she has difficulty with fine motor tasks such as buttoning, tying shoe laces, and braiding her daughter's hair. She falls about once per month because her legs give out. She was started on Lyrica 50mg  TID around March which has helped slightly.   Of note, she history of pseudotumor cerebri, followed by ophthalmology, and currently takes diamox 500mg  BID. 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 She had lumbar puncture performed in the seated position with documented OP of 43 and 32cc of fluid removed. She had a second lumbar puncture in 2014 at which time OP was 18 mmHg.  - Follow-up 10/15/2013:  No significant difference in pain since increasing Lyrica to 100mg  BID.    - Follow-up 01/18/2014:  She had one interval emergency room visit due to severe headache and had LP which showed OP of 26.  Since then, headache has been under control.  Her leg pain continues to be problematic with no lasting benefit despite increase Lyrica to 200mg  BID.    - UPDATE 03/03/2014:  Doing better after increasing Lyrica to 200mg  TID and lidocaine ointment.  She reports less shaking of her hand and  reports greater than 50% improvement of pain with lidocaine.  She reports having new spells of syncope, which seems to only occur when she is sitting. There is loss of consciousness, lasting only a few seconds.  Following the spells, there is no fatigue, confusion, or weakness. On one ocassion, she felt herself leaning to the side and caught herself when at therapy. She is unaware of the spells.   - UPDATE 03/20/2015:  She comes today with complains of new eye drainage, right eye lid swelling, and "whoosing" sound in her ear which started today.   She is taking Lyrica 200mg  TID and lidocaine ointment which helps.  She continues to have syncopal spells, recent sleep study showed OSA and she justed CPAP last night.    - UPDATE 08/19/2014:  I received a call from her ophthalmologist, Dr. Vista Mink with new findings of severe left temporal visual field deficit involving her left eye (good eye).  My office set up urgent CT/A which returned normal and arranged for MRI brain to be done the following week. Patient cancelled her MRI scheduled for 5/9 and rescheduled it to 5/25.  She underwent LP with OP of 27cm of water.  Since her LP, she reports feeling as if her vision is better and eyes don't feel as foggy anymore.   Denies headaches, worsening of vision, or weakness.  Her potassium levels have been persistently low despite taking supplements and she was recently started on spironolactone.   - UPDATE 10/21/2014:  She states that her peripheral vision on the left has somewhat improved.  Headaches are better.  She was evaluated  by neurosurgery for shunt evaluation, but because of no papilledema, did not feel that she would be a good candidate unless symptoms changes or worsened.  Unfortunately, she had an interval hospitalization for acute mental status change last month and was found to have AKI, etiology unclear. Her blood pressure has remained elevated and she was seen yesterday by her PCP who made changes to her  regimen.  Medications:  Current Outpatient Prescriptions on File Prior to Visit  Medication Sig Dispense Refill  . acetaZOLAMIDE (DIAMOX) 250 MG tablet Take 4 tablets (1,000 mg total) by mouth 3 (three) times daily. (Patient taking differently: Take 250 mg by mouth 3 (three) times daily. ) 400 tablet 5  . albuterol (PROVENTIL HFA;VENTOLIN HFA) 108 (90 BASE) MCG/ACT inhaler Inhale 2 puffs into the lungs every 4 (four) hours as needed for wheezing or shortness of breath (((PLAN A))). 18 g 5  . albuterol (PROVENTIL) (2.5 MG/3ML) 0.083% nebulizer solution Take 3 mLs (2.5 mg total) by nebulization every 4 (four) hours as needed for wheezing or shortness of breath (((PLAN B))). 300 mL 5  . Biotin 10 MG TABS Take 1 tablet by mouth daily.     . budesonide-formoterol (SYMBICORT) 160-4.5 MCG/ACT inhaler Take 2 puffs first thing in am and then another 2 puffs about 12 hours later. 1 Inhaler 12  . chlorpheniramine (CHLOR-TRIMETON) 4 MG tablet Take 8 mg by mouth at bedtime.    . cloNIDine (CATAPRES) 0.3 MG tablet Take 1 tablet (0.3 mg total) by mouth 2 (two) times daily. 90 tablet 3  . dextromethorphan-guaiFENesin (MUCINEX DM) 30-600 MG per 12 hr tablet Take 2 tablets by mouth 2 (two) times daily as needed for cough.    . diltiazem (CARDIZEM CD) 240 MG 24 hr capsule Take 240 mg by mouth daily.    Marland Kitchen EPINEPHrine 0.3 mg/0.3 mL IJ SOAJ injection Inject 0.3 mLs (0.3 mg total) into the muscle once. 2 Device 1  . furosemide (LASIX) 40 MG tablet Take 40 mg by mouth daily as needed.    . hydrALAZINE (APRESOLINE) 50 MG tablet Take 1 tablet (50 mg total) by mouth 3 (three) times daily. 90 tablet 11  . hyoscyamine (LEVSIN SL) 0.125 MG SL tablet Place 1 tablet (0.125 mg total) under the tongue every 8 (eight) hours as needed for cramping. (Patient taking differently: Place 0.125 mg under the tongue 3 (three) times daily. ) 30 tablet 2  . isosorbide mononitrate (IMDUR) 60 MG 24 hr tablet Take 1 tablet (60 mg total) by mouth  daily. 30 tablet 11  . LORazepam (ATIVAN) 0.5 MG tablet Take 0.5 mg by mouth at bedtime as needed for anxiety.    . metoCLOPramide (REGLAN) 10 MG tablet TAKE 1 TABLET BY MOUTH FOUR TIMES DAILY WITH MEALS AND AT BEDTIME 120 tablet 0  . mometasone (NASONEX) 50 MCG/ACT nasal spray Place 2 sprays into the nose 2 (two) times daily.    . montelukast (SINGULAIR) 10 MG tablet Take 1 tablet (10 mg total) by mouth at bedtime. 30 tablet 5  . Nebivolol HCl (BYSTOLIC) 20 MG TABS 1 tab by mouth per day 90 tablet 3  . nitroGLYCERIN (NITROSTAT) 0.4 MG SL tablet Place 1 tablet (0.4 mg total) under the tongue every 5 (five) minutes as needed for chest pain. 90 tablet 3  . ondansetron (ZOFRAN-ODT) 4 MG disintegrating tablet Take 1 tablet (4 mg total) by mouth every 6 (six) hours as needed for nausea or vomiting. 20 tablet 5  . pantoprazole (PROTONIX) 40  MG tablet Take 1 tablet (40 mg total) by mouth 2 (two) times daily before a meal. 60 tablet 3  . potassium chloride SA (K-DUR,KLOR-CON) 20 MEQ tablet Take 20 mEq by mouth 3 (three) times daily.    . predniSONE (DELTASONE) 10 MG tablet Take  4 each am x 2 days,   2 each am x 2 days,  1 each am x 2 days and stop 14 tablet 0  . pregabalin (LYRICA) 200 MG capsule Take 1 capsule (200 mg total) by mouth 3 (three) times daily. 90 capsule 5  . Respiratory Therapy Supplies (FLUTTER) DEVI Use as directed. 1 each 0  . sucralfate (CARAFATE) 1 G tablet Take 1 g by mouth 4 (four) times daily.    . traMADol (ULTRAM) 50 MG tablet 1-2 every 4 hours as needed for cough or pain 30 tablet 0   No current facility-administered medications on file prior to visit.    Allergies:  Allergies  Allergen Reactions  . Aspirin Shortness Of Breath and Palpitations    Pt can take ibuprofen without reaction  . Doxycycline Anaphylaxis  . Penicillins Shortness Of Breath and Palpitations  . Kiwi Extract Itching and Swelling    Lip swelling  . Strawberry Hives     Review of Systems:    CONSTITUTIONAL: No fevers, chills, night sweats, or weight loss.   EYES: No visual changes or eye pain ENT: No hearing changes.  No history of nose bleeds.   RESPIRATORY: No cough, wheezing and shortness of breath.   CARDIOVASCULAR: Negative for chest pain, and palpitations.   GI: Negative for abdominal discomfort, blood in stools or black stools.  No recent change in bowel habits.   GU:  No history of incontinence.   MUSCLOSKELETAL: No history of joint pain or swelling.  No myalgias.   SKIN: Negative for lesions, rash, and itching.   ENDOCRINE: Negative for cold or heat intolerance, polydipsia or goiter.   PSYCH:  No depression or anxiety symptoms.   NEURO: As Above.   Vital Signs:  BP 180/104 mmHg  Pulse 87  Ht 5\' 6"  (1.676 m)  Wt 191 lb 8 oz (86.864 kg)  BMI 30.92 kg/m2  SpO2 97%   Neurological Exam: MENTAL STATUS including orientation to time, place, person is normal.  Speech is not dysarthric.  CRANIAL NERVES:  No papilledema on fundoscopic exam, disc appears sharp bilaterally, disc is pale on the right (old).  She is blind in the right eye - only able to see light.  Visual field testing of the left eye is shows reduced peripheral vision on the left temporal field.  Pupils equal round and reactive to light.  Normal conjugate, extra-ocular eye movements in all directions of gaze.  Face is symmetric.  MOTOR:  Motor strength is 5/5 in all extremities. Left hand with intention tremor when hands outstretched.  MSRs:  Reflexes are 2+/4 throughout.  SENSORY:  Intact to vibration throughout  COORDINATION/GAIT:   Gait narrow based and stable.   Data: [facility], Dr. 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 CT/A head and neck 07/26/2014:  Unremarkable  Lumbar puncture with OP 07/29/2014: Elevated opening pressure it 27 cm of water which was decreased to 16 cm of water.  CSF  09/18/2011: R269 W0 G65 P17, CSF cytology-neg OP 43, 32cc removed   LP 02/05/2013: OP 18, CP 17 after 21 cc removed  LP 10/25/2013:  OP 26,  CP 5 after 15 cc removed  Labs 09/17/2011: vitamin B12 725, RPR NR   CT head 02/05/2013: No acute abnormality   MRI brain 08/13/2010:  1. No evidence for metastatic disease to the brain.  2. Scattered subcortical T2 and FLAIR hyperintensities are slightly greater than expected for age. The finding is nonspecific  but can be seen in the setting of chronic microvascular ischemia, a demyelinating process such as multiple sclerosis, vasculitis, complicated migraine headaches, or as the sequelae of a prior infectious or inflammatory process. The lesions could be related to prior brain radiation if this has been performed.   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  MRI brain showed white matter changes, no focal findings.  Although she has history of serial lumbar punctures over the years, with elevated CSF pressure, there is no papilledema exam.  Therefore other causes of optic neuropathy such as hypertension also needs to be considered. She is not a candidate for VP shunt at this time and was seen by Dr. Kathyrn Sheriff Continue diamox to 1000mg  TID, she is also taking lasix 40mg  and potassium supplements.   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  Clinically stable. Symptoms manifesting with painful parsthesias of her hands and feet which started following chemotherapy with docetaxel/cyclophosphamide (2010).  Continue Lyrica to 200mg  TID and adding lidocaine ointment to her feet.    Benign essential tremors, monitor for now.   No betablocks given history of asthma  Syncopal spells These episodes are  very brief with quick return back to baseline.   Cardiac work-up is negative, EEG is negative Patient informed of [place] law of no driving for 83-months since last spell of impaired consciousness  Resistant hypertension, asymptomatic BP medications adjusted yesterday and she is starting new regimen  Encouraged to monitor BP at home  Return to clinic in 3 months   The duration of this appointment visit was 25 minutes of face-to-face time with the patient.  Greater than 50% of this time was spent in counseling, explanation of diagnosis, planning of further management, and coordination of care.   Thank you for allowing me to participate in patient's care.  If I can answer any additional questions, I would be pleased to do so.    Sincerely,    Christia Coaxum K. Posey Pronto, DO

## 2014-10-21 NOTE — Assessment & Plan Note (Signed)
stable overall by history and exam, and pt to continue medical treatment as before,  to f/u any worsening symptoms or concerns 

## 2014-10-21 NOTE — Patient Instructions (Signed)
Return to clinic in 3-4 months. 

## 2014-10-24 ENCOUNTER — Encounter: Payer: Self-pay | Admitting: Internal Medicine

## 2014-10-24 NOTE — Assessment & Plan Note (Signed)
Not Adequate control on present rx, reviewed > try increase bystolic 10 mg twice daily until returns

## 2014-10-24 NOTE — Assessment & Plan Note (Signed)
Body mass index is 30.97  > trending up  Lab Results  Component Value Date   TSH 0.570 09/20/2014     Contributing to gerd tendency/ doe/hbp/ reviewed need  achieve and maintain neg calorie balance > defer f/u primary care including intermittently monitoring thyroid status

## 2014-10-24 NOTE — Assessment & Plan Note (Signed)
-   PFT's 01/31/2011 FEV1  1.76 (60% ) ratio 68 and no better p B2,  DLCO 70% -med review 09/25/2012 > Dulera drug assistance  paperwork given    - HFA 90% 11/20/2012 .  -Med calendar 12/07/12 , 04/06/2013 , 04/18/2014,, 08/30/2014  - PFT's 01/12/2013  1.73 (65%)  Ratio 73 and no better p B2, DLCO 86% - trial off singulair 02/08/13 >>>did not stop  - 04/05/2014  trial of dulera 100 instead of 200 > worse  -04/18/2014 increase Dulera 200  - 08/01/2014 p extensive coaching HFA effectiveness =    90% - 08/01/2014  Walked RA x 2laps @ 185 ft each stopped due to cough > sob/ no desat moderate pace  - spirometry 08/01/2014  FEV1  1.87 (70%) ratio 78  fef 25-75 53%    Her symptoms are poorly controlled and she is consistently inconsistent at following instructions and correlating what she does with her med calendar porvided   I had an extended discussion with the patient reviewing all relevant studies completed to date and  lasting 25  minutes of a 40 minute visit    Unlike when you get a prescription for eyeglasses, it's not possible to always walk out of this or any medical office with a perfect prescription that is immediately effective  based on any test that we offer here.    On the contrary, it may take several weeks for the full impact of changes recommened today - hopefully you will respond well.  If not, then we'll adjust your medication on your next visit accordingly, knowing more then than we can possibly know now.      Each maintenance medication was reviewed in detail including most importantly the difference between maintenance and prns and under what circumstances the prns are to be triggered using an action plan format that is not reflected in the computer generated alphabetically organized AVS.    Please see instructions for details which were reviewed in writing and the patient given a copy highlighting the part that I personally wrote and discussed at today's ov.

## 2014-10-27 ENCOUNTER — Telehealth: Payer: Self-pay | Admitting: *Deleted

## 2014-10-27 ENCOUNTER — Other Ambulatory Visit: Payer: Self-pay | Admitting: *Deleted

## 2014-10-27 DIAGNOSIS — C50412 Malignant neoplasm of upper-outer quadrant of left female breast: Secondary | ICD-10-CM

## 2014-10-27 DIAGNOSIS — N939 Abnormal uterine and vaginal bleeding, unspecified: Secondary | ICD-10-CM

## 2014-10-27 DIAGNOSIS — Z0000 Encounter for general adult medical examination without abnormal findings: Secondary | ICD-10-CM

## 2014-10-27 NOTE — Telephone Encounter (Signed)
See other note

## 2014-10-29 ENCOUNTER — Other Ambulatory Visit: Payer: Self-pay | Admitting: Adult Health

## 2014-10-29 ENCOUNTER — Other Ambulatory Visit: Payer: Self-pay | Admitting: Oncology

## 2014-10-29 ENCOUNTER — Other Ambulatory Visit: Payer: Self-pay | Admitting: Internal Medicine

## 2014-10-31 ENCOUNTER — Other Ambulatory Visit: Payer: Self-pay | Admitting: Adult Health

## 2014-10-31 ENCOUNTER — Other Ambulatory Visit: Payer: Self-pay | Admitting: *Deleted

## 2014-10-31 MED ORDER — LORAZEPAM 0.5 MG PO TABS: 0.5000 mg | ORAL_TABLET | Freq: Three times a day (TID) | ORAL | Status: DC | PRN

## 2014-11-03 ENCOUNTER — Telehealth: Payer: Self-pay

## 2014-11-03 ENCOUNTER — Ambulatory Visit: Payer: Medicare Other | Admitting: Internal Medicine

## 2014-11-03 NOTE — Telephone Encounter (Signed)
Received pharmacy rejection stating that insurance will not cover Zofran without a prior authorization.

## 2014-11-04 ENCOUNTER — Telehealth: Payer: Self-pay | Admitting: *Deleted

## 2014-11-04 ENCOUNTER — Encounter: Payer: Self-pay | Admitting: *Deleted

## 2014-11-04 NOTE — Telephone Encounter (Signed)
I have spoken to [place] @ [facility] to do prior authorization for patient's pantoprazole 40 mg twice daily. Patient has GERD (K21.9) and has previously tried Nexium, prilosec and zantac. PA being sent for clinical review. We will receive response within 24-72 business hours.

## 2014-11-07 NOTE — Telephone Encounter (Signed)
Patient's insurance company has denied pantoprazole 40 mg twice daily dosing. Patient must first try and fail omeprazole 10 mg and 20 mg capsules as well as Dexilant. Dr Hilarie Fredrickson, do I need to send once daily Dexilant or twice daily?

## 2014-11-08 ENCOUNTER — Encounter: Payer: Self-pay | Admitting: Internal Medicine

## 2014-11-08 ENCOUNTER — Encounter: Payer: Self-pay | Admitting: Adult Health

## 2014-11-08 ENCOUNTER — Ambulatory Visit (INDEPENDENT_AMBULATORY_CARE_PROVIDER_SITE_OTHER): Payer: Medicare Other | Admitting: Internal Medicine

## 2014-11-08 ENCOUNTER — Other Ambulatory Visit: Payer: Self-pay | Admitting: Internal Medicine

## 2014-11-08 ENCOUNTER — Ambulatory Visit (INDEPENDENT_AMBULATORY_CARE_PROVIDER_SITE_OTHER): Payer: Medicare Other | Admitting: Adult Health

## 2014-11-08 ENCOUNTER — Other Ambulatory Visit (INDEPENDENT_AMBULATORY_CARE_PROVIDER_SITE_OTHER): Payer: Medicare Other

## 2014-11-08 VITALS — BP 146/92 | Temp 98.7°F | Ht 66.0 in | Wt 192.0 lb

## 2014-11-08 VITALS — BP 144/86 | HR 94 | Temp 98.4°F | Ht 66.0 in | Wt 191.0 lb

## 2014-11-08 DIAGNOSIS — J4540 Moderate persistent asthma, uncomplicated: Secondary | ICD-10-CM | POA: Diagnosis not present

## 2014-11-08 DIAGNOSIS — I10 Essential (primary) hypertension: Secondary | ICD-10-CM | POA: Diagnosis not present

## 2014-11-08 DIAGNOSIS — I5032 Chronic diastolic (congestive) heart failure: Secondary | ICD-10-CM | POA: Diagnosis not present

## 2014-11-08 DIAGNOSIS — N179 Acute kidney failure, unspecified: Secondary | ICD-10-CM

## 2014-11-08 DIAGNOSIS — R739 Hyperglycemia, unspecified: Secondary | ICD-10-CM | POA: Diagnosis not present

## 2014-11-08 LAB — BASIC METABOLIC PANEL
BUN: 13 mg/dL (ref 6–23)
CO2: 30 mEq/L (ref 19–32)
Calcium: 9.7 mg/dL (ref 8.4–10.5)
Chloride: 102 mEq/L (ref 96–112)
Creatinine, Ser: 0.93 mg/dL (ref 0.40–1.20)
GFR: 85.39 mL/min (ref >60.00)
GLUCOSE: 87 mg/dL (ref 70–99)
Potassium: 3.1 mEq/L — ABNORMAL LOW (ref 3.5–5.1)
SODIUM: 138 meq/L (ref 135–145)

## 2014-11-08 LAB — HEMOGLOBIN A1C: HEMOGLOBIN A1C: 5.7 % (ref 4.6–6.5)

## 2014-11-08 MED ORDER — BUDESONIDE-FORMOTEROL FUMARATE 160-4.5 MCG/ACT IN AERO: INHALATION_SPRAY | RESPIRATORY_TRACT | Status: DC

## 2014-11-08 MED ORDER — DEXLANSOPRAZOLE 60 MG PO CPDR: 60.0000 mg | DELAYED_RELEASE_CAPSULE | Freq: Every day | ORAL | Status: DC

## 2014-11-08 MED ORDER — CLONIDINE HCL 0.2 MG PO TABS: 0.2000 mg | ORAL_TABLET | Freq: Two times a day (BID) | ORAL | Status: DC

## 2014-11-08 MED ORDER — HYDRALAZINE HCL 100 MG PO TABS: 100.0000 mg | ORAL_TABLET | Freq: Three times a day (TID) | ORAL | Status: DC

## 2014-11-08 NOTE — Addendum Note (Signed)
Addended by: Larina Bras on: 11/08/2014 11:01 AM   Modules accepted: Orders, Medications

## 2014-11-08 NOTE — Telephone Encounter (Signed)
Rx sent. Patient advised of changes.

## 2014-11-08 NOTE — Telephone Encounter (Signed)
Once daily dexilant 60 mg, d/c other PPIs

## 2014-11-08 NOTE — Progress Notes (Signed)
Pre visit review using our clinic review tool, if applicable. No additional management support is needed unless otherwise documented below in the visit note. 

## 2014-11-08 NOTE — Progress Notes (Signed)
Subjective:    Patient ID: Erica [name], female    DOB: 04-Dec-1975, 39 y.o.   MRN: [id number]  HPI  Here to f/u with me in Dr [name] abscence today, Pt denies chest pain, increased sob or doe, wheezing, orthopnea, PND, increased LE swelling, palpitations, dizziness or syncope. Pt denies new neurological symptoms such as new headache, or facial or extremity weakness or numbness   Pt denies polydipsia, polyuria, still with significant dry mouth as well on the high dose clonidine BP Readings from Last 3 Encounters:  11/08/14 146/92  11/08/14 144/86  10/21/14 180/104   Wt Readings from Last 3 Encounters:  11/08/14 192 lb (87.091 kg)  11/08/14 191 lb (86.637 kg)  10/21/14 191 lb 8 oz (86.864 kg)   Past Medical History  Diagnosis Date  . Asthma   . Hypertension   . GERD (gastroesophageal reflux disease)   . Impaired glucose tolerance 04/13/2011  . Anemia, unspecified 04/13/2011  . Asthma 09/27/2010  . Bloating 03/18/2011  . 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 Lymphadenitis, chronic     restricted LEFT extremity  . Dyspnea on exertion     a. 01/2013 Lexi MV: EF 54%, no ischemia/infarct;  b. 05/2013 Echo: EF 60-65%, no rwma, Gr 2 DD;  b.   . OSA (obstructive sleep apnea) 05/04/2014    AHI 31/hr now on CPAP at 12cm H2O  . Breast cancer 2010    a. locally advanced left breast carcinoma diagnosed in 2010 and treated with neoadjuvant chemotherapy with docetaxel and cyclophosphamide as well as paclitaxel. This was followed by radiation therapy which was completed in 2011.  . Fatty liver   . Gastroparesis    Past Surgical History  Procedure Laterality Date  . Tendon release Right 2012    "hand"  . Breast lumpectomy Left 09/2008  . Carpal tunnel release Left 2011  . Tubal ligation  05/11/2011    Procedure: POST PARTUM TUBAL LIGATION;  Surgeon: Emeterio Reeve, MD;  Location: [facility];  Service: Gynecology;  Laterality: Bilateral;  Induced for HTN  . Lymph node dissection Left 2010    breast; 2 wks  after breast lumpectomy    reports that she has never smoked. She has never used smokeless tobacco. She reports that she drinks alcohol. She reports that she does not use illicit drugs. family history includes Diabetes in her maternal grandmother; Heart disease in her maternal grandmother; Hypertension in her maternal grandmother. There is no history of Cancer, Alcohol abuse, Early death, Hyperlipidemia, Kidney disease, Stroke, or Colon cancer. Allergies  Allergen Reactions  . Aspirin Shortness Of Breath and Palpitations    Pt can take ibuprofen without reaction  . Doxycycline Anaphylaxis  . Penicillins Shortness Of Breath and Palpitations  . Kiwi Extract Itching and Swelling    Lip swelling  . Strawberry Hives   Current Outpatient Prescriptions on File Prior to Visit  Medication Sig Dispense Refill  . acetaZOLAMIDE (DIAMOX) 250 MG tablet Take 4 tablets (1,000 mg total) by mouth 3 (three) times daily. (Patient taking differently: Take 250 mg by mouth 3 (three) times daily. ) 400 tablet 5  . albuterol (PROVENTIL HFA;VENTOLIN HFA) 108 (90 BASE) MCG/ACT inhaler Inhale 2 puffs into the lungs every 4 (four) hours as needed for wheezing or shortness of breath (((PLAN A))). 18 g 5  . albuterol (PROVENTIL) (2.5 MG/3ML) 0.083% nebulizer solution Take 3 mLs (2.5 mg total) by nebulization every 4 (four) hours as needed for wheezing or shortness of breath (((PLAN  B))). 300 mL 5  . Biotin 10 MG TABS Take 1 tablet by mouth daily.     . budesonide-formoterol (SYMBICORT) 160-4.5 MCG/ACT inhaler Take 2 puffs first thing in am and then another 2 puffs about 12 hours later. 1 Inhaler 5  . chlorpheniramine (CHLOR-TRIMETON) 4 MG tablet Take 8 mg by mouth at bedtime.    Marland Kitchen dexlansoprazole (DEXILANT) 60 MG capsule Take 1 capsule (60 mg total) by mouth daily. 30 capsule 3  . dextromethorphan-guaiFENesin (MUCINEX DM) 30-600 MG per 12 hr tablet Take 2 tablets by mouth 2 (two) times daily as needed for cough.    .  diltiazem (CARDIZEM CD) 240 MG 24 hr capsule Take 240 mg by mouth daily.    Marland Kitchen EPINEPHrine 0.3 mg/0.3 mL IJ SOAJ injection Inject 0.3 mLs (0.3 mg total) into the muscle once. 2 Device 1  . furosemide (LASIX) 40 MG tablet Take 40 mg by mouth daily as needed.    . hyoscyamine (LEVSIN SL) 0.125 MG SL tablet Place 1 tablet (0.125 mg total) under the tongue every 8 (eight) hours as needed for cramping. (Patient taking differently: Place 0.125 mg under the tongue 3 (three) times daily. ) 30 tablet 2  . isosorbide mononitrate (IMDUR) 60 MG 24 hr tablet Take 1 tablet (60 mg total) by mouth daily. 30 tablet 11  . LORazepam (ATIVAN) 0.5 MG tablet Take 1 tablet (0.5 mg total) by mouth every 8 (eight) hours as needed. for anxiety 90 tablet 0  . metoCLOPramide (REGLAN) 10 MG tablet TAKE 1 TABLET BY MOUTH FOUR TIMES DAILY WITH MEALS AND AT BEDTIME 120 tablet 0  . mometasone (NASONEX) 50 MCG/ACT nasal spray INSTILL 2 SPRAYS IN EACH NOSTRIL ONCE DAILY 17 g 3  . montelukast (SINGULAIR) 10 MG tablet Take 1 tablet (10 mg total) by mouth at bedtime. 30 tablet 5  . Nebivolol HCl (BYSTOLIC) 20 MG TABS 1 tab by mouth per day 90 tablet 3  . nitroGLYCERIN (NITROSTAT) 0.4 MG SL tablet Place 1 tablet (0.4 mg total) under the tongue every 5 (five) minutes as needed for chest pain. 90 tablet 3  . ondansetron (ZOFRAN-ODT) 4 MG disintegrating tablet Take 1 tablet (4 mg total) by mouth every 6 (six) hours as needed for nausea or vomiting. 20 tablet 5  . potassium chloride SA (K-DUR,KLOR-CON) 20 MEQ tablet Take 20 mEq by mouth 3 (three) times daily.    . predniSONE (DELTASONE) 10 MG tablet Take  4 each am x 2 days,   2 each am x 2 days,  1 each am x 2 days and stop 14 tablet 0  . pregabalin (LYRICA) 200 MG capsule Take 1 capsule (200 mg total) by mouth 3 (three) times daily. 90 capsule 5  . Respiratory Therapy Supplies (FLUTTER) DEVI Use as directed. 1 each 0  . sucralfate (CARAFATE) 1 G tablet Take 1 g by mouth 4 (four) times daily.     . traMADol (ULTRAM) 50 MG tablet TAKE 1 TO 2 TABLETS BY MOUTH EVERY 4 HOURS AS NEEDED FOR COUGH OR PAIN 30 tablet 0   No current facility-administered medications on file prior to visit.   Review of Systems  Constitutional: Negative for unusual diaphoresis or night sweats HENT: Negative for ringing in ear or discharge Eyes: Negative for double vision or worsening visual disturbance.  Respiratory: Negative for choking and stridor.   Gastrointestinal: Negative for vomiting or other signifcant bowel change Genitourinary: Negative for hematuria or change in urine volume.  Musculoskeletal: Negative for  other MSK pain or swelling Skin: Negative for color change and worsening wound.  Neurological: Negative for tremors and numbness other than noted  Psychiatric/Behavioral: Negative for decreased concentration or agitation other than above       Objective:   Physical Exam BP 146/92 mmHg  Temp(Src) 98.7 F (37.1 C) (Oral)  Ht 5\' 6"  (1.676 m)  Wt 192 lb (87.091 kg)  BMI 31.00 kg/m2 VS noted,  Constitutional: Pt appears in no significant distress HENT: Head: NCAT.  Right Ear: External ear normal.  Left Ear: External ear normal.  Eyes: . Pupils are equal, round, and reactive to light. Conjunctivae and EOM are normal Neck: Normal range of motion. Neck supple.  Cardiovascular: Normal rate and regular rhythm.   Pulmonary/Chest: Effort normal and breath sounds without rales or wheezing.  Neurological: Pt is alert. Not confused , motor grossly intact Skin: Skin is warm. No rash, no LE edema Psychiatric: Pt behavior is normal. No agitation.      Assessment & Plan:

## 2014-11-08 NOTE — Patient Instructions (Signed)
Follow med calendar closely and bring to each visit.   Follow up Dr. Wert  In 3 months  and As needed         

## 2014-11-08 NOTE — Patient Instructions (Addendum)
OK to increase the hydralazine to 100 mg three times per day  OK to decrease the clonidine to 0.2 mg per day  Please continue all other medications as before, and refills have been done if requested.  Please have the pharmacy call with any other refills you may need.  Please continue your efforts at being more active, low cholesterol diet, and weight control.  Please keep your appointments with your specialists as you may have planned  Please go to the LAB in the Basement (turn left off the elevator) for the tests to be done today  You will be contacted by phone if any changes need to be made immediately.  Otherwise, you will receive a letter about your results with an explanation, but please check with MyChart first.  Please remember to sign up for MyChart if you have not done so, as this will be important to you in the future with finding out test results, communicating by private email, and scheduling acute appointments online when needed.  Please see Dr [name] in 4 weeks to follow up, thanks

## 2014-11-08 NOTE — Assessment & Plan Note (Signed)
Minor recent elev glc, pt with obesity, ok for a1c,  to f/u any worsening symptoms or concerns

## 2014-11-08 NOTE — Progress Notes (Addendum)
Subjective:    Patient ID: Erica [name], female    DOB: [birth year]   MRN: [id number]    Brief patient profile:  19 yobf with asthma all her life much worse since around 07/2010 referred by Dr [name] to pulmonary clinic in Sept 2012 Hx of Breast Cancer 05/2008    History of Present Illness  12/18/2010 Initial pulmonary office eval on acei and  advair cc dtc asthma x 4 months but baseline = excess saba use "even when better" at least twice daily despite daily advair, worse at hs in fact  used saba 3 x last night.  On chemo /RT for breast ca but last rx was Jan 2012 well before asthma worse.  To er multiple times >  no better with singular added x 3 months. rec Stop Lisinopril Dulera 200 Take 2 puffs first thing in am and then another 2 puffs about 12 hours later.  Work on inhaler technique  Nexium 40 mg   Take 30-60 min before first meal of the day and Pepcid 20 mg one bedtime until return Only use your albuterol as a rescue medication to be used if you can't catch your breath by resting or doing a relaxed purse lip breathing pattern. The less you use it, the better it will work when you need it.  Prednisone 10 mg take  4 each am x 2 days,   2 each am x 2 days,  1 each am x2days and stop    01/03/2011 f/u ov/Wert cc much better off ace and advair but still needing saba twice daily on avg. No  Purulent sputum. rec no change in rx/ rule of 2's reviewed     04/06/2013 NP Follow up and Med Review   We reviewed all her meds and organized them into a med calendar. She does have Medicaid but depends on samples. Did not bring all her meds with her today , says they are at home.  Says her breathing is the same since last ov w/ SOB, Has on /off wheezing, some chest heaviness, prod cough with yellow mucus. Good and bad days. Wears out easily .  Denies f/c/s, nausea, vomiting.  Did not stop singulair as recommended.  rec  Follow med calendar closely and bring to each visit > did not do  We are  referring you for a CPSTwith spirometry before and after> never done   04/18/2014 NP Follow up and Med Review / NP  Patient returns for a two-week follow-up and medication review. We reviewed all her medications organize them into a medication count with patient education It appears that she is taking her medications correctly, however, she has over 30 medications and has had several changes recently. Last visit, her [name] was decreased to the 112mcg says that her breathing was better on the 246mcg Patient had a chest x-ray last visit that showed no acute changes She underwent a CT sinus due to persistent cough , which was positive for sinusitis on the right She was treated with a ten-day course of Avelox, she has one dose left Does feel some better but still has cough . She denies any hemoptysis, orthopnea, PND or leg swelling  Patient has upcoming sleep study. Next week for suspected underlying sleep apnea with snoring and daytime sleepiness rec Refer to ENT for sinusitis and chronic cough > never saw one> f/u sinus CT neg 07/26/14  Increase Dulera 200 2 puffs Twice daily   Follow med calendar closely and bring  to each visit.     08/01/2014 f/u ov/Wert re:  Asthma plus noct "gasping"  Chief Complaint  Patient presents with  . Follow-up    doing well.  no complaints.  not doing well x one year with noct / early  4am with cough/ gasping for air almost always goes to neb and then can't go back to sleep even 30 min later better Daytime in chest tightness resolves  completely while on prednisone last took one month prior to OV  And doesn't have it now but has exact same noct symptoms and overusing saba  Doe x mailbox and back to house out of breath and coughing  >>change dulera 200 to 100   08/30/2014 NP Follow up and Med review : Asthma Patient presents for a one-month follow-up We reviewed all her medications organize them into a medication count with patient education Patient has had  multiple medication changes recently She has been having difficulty with resistant hypertension and visual changes. Recently started on primidone and spironolactone . However, has not started this as of yet That she needs to go pick this up at the pharmacy. Neuro notes reviewed , has been referred to NS . B/p is elevated again at todays visit, discussed follow up with PCP for b/p management.  She tried to decrease her Dulera as recommended from last ov with Dr. Melvyn Novas    however, was unable to and is back on 200 dose .  Says overall her breathing is doing ok  No flare of cough or wheezing  Does have a lot of drainage in throat . Discussed starting on chlortrimeton.  rec Continue Dulera 200 2 puffs Twice daily  , rinse after use  Follow med calendar closely and bring to each visit.  Follow up with Primary MD concerning elevated blood pressure    10/05/2014 f/u ov/Wert re: asthma with component of uacs/ no med calendar  Chief Complaint  Patient presents with  . Follow-up    Here to discuss meds- Dulera not on her drug formulary. Pt states breathing is doing well and denies any new co's today.   turns out using neb at least once every 24 h, esp between 2 and 4 am when wakes up with severe dry cough x months/ pred helps some but never eliminated noct cough rec symbicort 160 Take 2 puffs first thing in am and then another 2 puffs about 12 hours later.  Try taking the tramadol around 2 am to see if helps with the early am cough/ wheeze  See calendar for specific medication instructions and bring it back for each and every office visit  Separating the top medications from the bottom group is fundamental to providing you adequate care going forward.   Please schedule a follow up office visit in 2 weeks, sooner if needed with all medications in 2 separate bags    10/19/2014 f/u ov/Wert re:  Asthma vs uacs/ not using med calendar correclty  Chief Complaint  Patient presents with  . Follow-up     Cough has changed sicnce starting symbicort- prod cough with "dirty" sputum. She states she feels that she can tell when her PM dose is due b/c she starts feeling SOB. She is using proair once per day on average.   not using symbicort 160 correctly  >>pred taper ,   11/08/2014 Follow up : Asthma /UAC   Pt returns for follow up .  Last ov with asthma flare , tx with pred taper  She is feeling better Breathing is doing better and back to her baseline  Still has dry cough but some better.  We reviewed all her meds and organized them into a med calendar with pt education  Appears to be taking correctly although she is on over 30 medications .  B/p is improved on current regimen.  She has follow up with PCP later today .  She denies chest pain, orthopnea, edema or fever.    Current Medications, Allergies, Complete Past Medical History, Past Surgical History, Family History, and Social History were reviewed in [employer] record.  ROS  The following are not active complaints unless bolded sore throat, dysphagia, dental problems, itching, sneezing,  nasal congestion or excess/ purulent secretions, ear ache,   fever, chills, sweats, unintended wt loss, classically pleuritic or exertional cp, hemoptysis,  orthopnea pnd or leg swelling, presyncope, palpitations, abdominal pain, anorexia, nausea, vomiting, diarrhea  or change in bowel or bladder habits, change in stools or urine, dysuria,hematuria,  rash, arthralgias, visual complaints, headache, numbness, weakness or ataxia or problems with walking or coordination,  change in mood/affect or memory.             Objective:   Physical Exam  Obese bf nad quite hoarse / vital signs reviewed  Wt 213  12/18/10 > 01/03/2011  214 > 01/31/2011  206 >  09/11/2012 209 >208 6/19 > 210 09/25/2012 > 11/20/2012 211 >208 12/07/12> 01/12/2013 209 > 02/09/2013 210 >207 04/06/2013 > 07/14/2013  213>212 07/28/2013 >204 01/26/2014> 04/04/2014 206  >205  04/18/2014 >  07/18/2014 201 >  08/01/14 201 >198 08/30/2014 > 10/05/14  195 > 10/19/2014 192 > 191 11/08/2014   HEENT: nl dentition, turbinates, and orophanx. Nl external ear canals without cough reflex,     NECK :  without JVD/Nodes/TM/ nl carotid upstrokes bilaterally   LUNGS: lungs clear bilaterally with good air movement  CV:  RRR  no s3 or murmur or increase in P2, no edema   ABD:  soft and nontender with nl excursion in the supine position. No bruits or organomegaly, bowel sounds nl  MS:  warm without deformities, calf tenderness, cyanosis or clubbing    CXR 09/21/14 BB atx

## 2014-11-08 NOTE — Assessment & Plan Note (Addendum)
Recent flare now resolved with steroids  Improved control on Symbicort  Patient's medications were reviewed today and patient education was given. Computerized medication calendar was adjusted/completed   Plan  Cont on current regimen  follow up Dr. Melvyn Novas  In 3 months

## 2014-11-08 NOTE — Assessment & Plan Note (Signed)
Improved but still mild uncontrolled, to increase the hydralazein 100 tid, as well as decrease the clonidine to 0.2 due to dry mouth, cont all other meds, f/u with PCP 4 wks BP Readings from Last 3 Encounters:  11/08/14 146/92  11/08/14 144/86  10/21/14 180/104

## 2014-11-08 NOTE — Addendum Note (Signed)
Addended by: Osa Craver on: 11/08/2014 03:36 PM   Modules accepted: Orders

## 2014-11-08 NOTE — Assessment & Plan Note (Signed)
Appears compensated without evidence of vol overload

## 2014-11-08 NOTE — Assessment & Plan Note (Signed)
Pt reqeusts f/u, o/w stable overall by history and exam, recent data reviewed with pt, and pt to continue medical treatment as before,  to f/u any worsening symptoms or concerns Lab Results  Component Value Date   WBC 8.0 09/22/2014   HGB 12.4 09/22/2014   HCT 36.3 09/22/2014   PLT 242 09/22/2014   GLUCOSE 99 09/29/2014   CHOL 172 02/03/2014   TRIG 151.0* 02/03/2014   HDL 32.00* 02/03/2014   LDLCALC 110* 02/03/2014   ALT 27 09/20/2014   AST 28 09/20/2014   NA 142 09/29/2014   K 3.9 09/29/2014   CL 113* 09/29/2014   CREATININE 1.07 09/29/2014   BUN 17 09/29/2014   CO2 23 09/29/2014   TSH 0.570 09/20/2014   INR 1.1* 06/03/2012   HGBA1C 5.5 08/09/2014

## 2014-11-09 NOTE — Progress Notes (Signed)
Chart and office note reviewed in detail along with available xrays/ labs > agree with a/p as outlined  

## 2014-11-10 NOTE — Addendum Note (Signed)
Addended by: Osa Craver on: 11/10/2014 10:38 AM   Modules accepted: Orders, Medications

## 2014-11-17 ENCOUNTER — Ambulatory Visit (HOSPITAL_BASED_OUTPATIENT_CLINIC_OR_DEPARTMENT_OTHER): Payer: Medicare Other

## 2014-11-17 VITALS — BP 140/76 | HR 96 | Temp 98.9°F

## 2014-11-17 DIAGNOSIS — C50412 Malignant neoplasm of upper-outer quadrant of left female breast: Secondary | ICD-10-CM

## 2014-11-17 DIAGNOSIS — Z5111 Encounter for antineoplastic chemotherapy: Secondary | ICD-10-CM | POA: Diagnosis present

## 2014-11-17 MED ORDER — GOSERELIN ACETATE 3.6 MG ~~LOC~~ IMPL
3.6000 mg | DRUG_IMPLANT | Freq: Once | SUBCUTANEOUS | Status: AC
  Administered 2014-11-17: 3.6 mg via SUBCUTANEOUS
  Filled 2014-11-17: qty 3.6

## 2014-11-18 ENCOUNTER — Telehealth: Payer: Self-pay | Admitting: *Deleted

## 2014-11-18 NOTE — Telephone Encounter (Signed)
This RN was notified by nurse in injection room per visit today that pt has not been contacted regarding referral for follow up with GYN.  This RN reviewed referral and noted Dr Laray Anger requesting an appointment per Dr Gerarda Fraction referral.  This RN sent an inbox message to noted nurses associated with message for follow up and contact to this RN regarding status of appointment.

## 2014-11-21 ENCOUNTER — Encounter: Payer: Self-pay | Admitting: Obstetrics & Gynecology

## 2014-11-21 ENCOUNTER — Telehealth: Payer: Self-pay | Admitting: *Deleted

## 2014-11-21 NOTE — Telephone Encounter (Signed)
-----   Message from Vanessa G [place] sent at 11/21/2014  8:25 AM EDT ----- Regarding: RE: referral The Appt is on September 8 @1 :15pm ----- Message -----    From: Laureen Abrahams, RN    Sent: 11/18/2014   3:05 PM      To: Vanessa G [place] Subject: referral                                       Referral placed 7/28- pt came to our office for injection and states she has not received a call regarding an appointment for GYN concerns.  Noted message attached to referral per inbox from Dr Blima Ledger regarding an appoinment as well as request for scan.  Is this in process or is there something else I need to do ?  Please let me know - Thank  You-  Hinda Lenis RN with Dr [name]  Direct phone number [phone]

## 2014-12-01 ENCOUNTER — Encounter: Payer: Self-pay | Admitting: Internal Medicine

## 2014-12-01 ENCOUNTER — Ambulatory Visit (INDEPENDENT_AMBULATORY_CARE_PROVIDER_SITE_OTHER): Payer: Medicare Other | Admitting: Internal Medicine

## 2014-12-01 ENCOUNTER — Other Ambulatory Visit (INDEPENDENT_AMBULATORY_CARE_PROVIDER_SITE_OTHER): Payer: Medicare Other

## 2014-12-01 VITALS — BP 130/80 | HR 78 | Temp 98.7°F | Resp 16 | Ht 66.0 in | Wt 196.0 lb

## 2014-12-01 DIAGNOSIS — I10 Essential (primary) hypertension: Secondary | ICD-10-CM | POA: Diagnosis not present

## 2014-12-01 DIAGNOSIS — Z23 Encounter for immunization: Secondary | ICD-10-CM

## 2014-12-01 LAB — BASIC METABOLIC PANEL
BUN: 11 mg/dL (ref 6–23)
CO2: 22 mEq/L (ref 19–32)
CREATININE: 0.96 mg/dL (ref 0.40–1.20)
Calcium: 9.2 mg/dL (ref 8.4–10.5)
Chloride: 112 mEq/L (ref 96–112)
GFR: 82.29 mL/min (ref >60.00)
Glucose, Bld: 94 mg/dL (ref 70–99)
Potassium: 3.5 mEq/L (ref 3.5–5.1)
Sodium: 142 mEq/L (ref 135–145)

## 2014-12-01 LAB — MAGNESIUM: MAGNESIUM: 2.1 mg/dL (ref 1.5–2.5)

## 2014-12-01 NOTE — Progress Notes (Signed)
Pre visit review using our clinic review tool, if applicable. No additional management support is needed unless otherwise documented below in the visit note. 

## 2014-12-01 NOTE — Progress Notes (Signed)
Subjective:  Patient ID: Erica [name], female    DOB: Jan 24, 1976  Age: 39 y.o. MRN: [id number]  CC: Hypertension   HPI Erica [name] presents for a blood pressure check and to recheck her renal function. Her only complaint today is that she feels nervous from time to time.  Outpatient Prescriptions Prior to Visit  Medication Sig Dispense Refill  . acetaZOLAMIDE (DIAMOX) 250 MG tablet Take 4 tablets (1,000 mg total) by mouth 3 (three) times daily. (Patient taking differently: Take 4 tablets by mouth four times a day) 400 tablet 5  . albuterol (PROVENTIL HFA;VENTOLIN HFA) 108 (90 BASE) MCG/ACT inhaler Inhale 2 puffs into the lungs every 4 (four) hours as needed for wheezing or shortness of breath (((PLAN A))). (Patient taking differently: Inhale 2 puffs into the lungs every 4 (four) hours as needed for wheezing or shortness of breath (((PLAN B))). ) 18 g 5  . albuterol (PROVENTIL) (2.5 MG/3ML) 0.083% nebulizer solution Take 3 mLs (2.5 mg total) by nebulization every 4 (four) hours as needed for wheezing or shortness of breath (((PLAN B))). (Patient taking differently: Take 2.5 mg by nebulization every 4 (four) hours as needed for wheezing or shortness of breath (((PLAN C))). ) 300 mL 5  . Biotin 10 MG TABS Take 1 tablet by mouth every morning.     . budesonide-formoterol (SYMBICORT) 160-4.5 MCG/ACT inhaler Take 2 puffs first thing in am and then another 2 puffs about 12 hours later. 1 Inhaler 5  . chlorpheniramine (CHLOR-TRIMETON) 4 MG tablet Take 8 mg by mouth at bedtime.    . cloNIDine (CATAPRES) 0.2 MG tablet Take 1 tablet (0.2 mg total) by mouth 2 (two) times daily. (Patient taking differently: Take 0.3 mg by mouth 2 (two) times daily. ) 60 tablet 11  . dextromethorphan-guaiFENesin (MUCINEX DM) 30-600 MG per 12 hr tablet Take 2 tablets by mouth 2 (two) times daily as needed (for cough, congestion, and thick mucus).     Marland Kitchen diltiazem (CARDIZEM CD) 240 MG 24 hr capsule Take 240 mg by mouth  every morning.    Marland Kitchen EPINEPHrine 0.3 mg/0.3 mL IJ SOAJ injection Inject 0.3 mLs (0.3 mg total) into the muscle once. (Patient taking differently: Use as directed for allergic reaction) 2 Device 1  . furosemide (LASIX) 40 MG tablet Take 40 mg by mouth daily as needed.    . hydrALAZINE (APRESOLINE) 100 MG tablet Take 1 tablet (100 mg total) by mouth 3 (three) times daily. 90 tablet 11  . hyoscyamine (LEVSIN SL) 0.125 MG SL tablet Place 1 tablet (0.125 mg total) under the tongue every 8 (eight) hours as needed for cramping. (Patient taking differently: Place 0.125 mg under the tongue 3 (three) times daily. ) 30 tablet 2  . isosorbide mononitrate (IMDUR) 60 MG 24 hr tablet Take 1 tablet (60 mg total) by mouth daily. 30 tablet 11  . isosorbide mononitrate (IMDUR) 60 MG 24 hr tablet Take 60 mg by mouth every morning.    Marland Kitchen LORazepam (ATIVAN) 0.5 MG tablet Take 1 tablet (0.5 mg total) by mouth every 8 (eight) hours as needed. for anxiety 90 tablet 0  . metoCLOPramide (REGLAN) 10 MG tablet TAKE 1 TABLET BY MOUTH FOUR TIMES DAILY WITH MEALS AND AT BEDTIME 120 tablet 0  . mometasone (NASONEX) 50 MCG/ACT nasal spray Place 2 sprays into the nose 2 (two) times daily.    . montelukast (SINGULAIR) 10 MG tablet Take 1 tablet (10 mg total) by mouth at bedtime. 30 tablet  5  . Nebivolol HCl (BYSTOLIC) 20 MG TABS 1 tab by mouth per day (Patient taking differently: Take 20 mg by mouth every morning. ) 90 tablet 3  . nitroGLYCERIN (NITROSTAT) 0.4 MG SL tablet Place 1 tablet (0.4 mg total) under the tongue every 5 (five) minutes as needed for chest pain. 90 tablet 3  . ondansetron (ZOFRAN-ODT) 4 MG disintegrating tablet Take 1 tablet (4 mg total) by mouth every 6 (six) hours as needed for nausea or vomiting. (Patient taking differently: Take 4 mg by mouth every 6 (six) hours as needed for nausea. ) 20 tablet 5  . pantoprazole (PROTONIX) 40 MG tablet Take 40 mg by mouth 2 (two) times daily before a meal.    . potassium  chloride SA (K-DUR,KLOR-CON) 20 MEQ tablet Take 20 mEq by mouth 3 (three) times daily.    . pregabalin (LYRICA) 200 MG capsule Take 1 capsule (200 mg total) by mouth 3 (three) times daily. 90 capsule 5  . primidone (MYSOLINE) 50 MG tablet Take 1/2 tablet by mouth every morning    . Respiratory Therapy Supplies (FLUTTER) DEVI Use as directed. 1 each 0  . spironolactone (ALDACTONE) 25 MG tablet Take 1 tablet by mouth every morning and 2 tablets by mouth at bedtime    . sucralfate (CARAFATE) 1 G tablet Take 1 g by mouth 4 (four) times daily.    . traMADol (ULTRAM) 50 MG tablet TAKE 1 TO 2 TABLETS BY MOUTH EVERY 4 HOURS AS NEEDED FOR COUGH OR PAIN 30 tablet 0   No facility-administered medications prior to visit.    ROS Review of Systems  Constitutional: Negative for fever, chills, diaphoresis, appetite change and fatigue.  HENT: Negative.   Eyes: Negative.   Respiratory: Negative.  Negative for cough, choking, chest tightness, shortness of breath and stridor.   Cardiovascular: Negative.  Negative for chest pain, palpitations and leg swelling.  Gastrointestinal: Negative.  Negative for nausea, vomiting, abdominal pain, diarrhea, constipation and blood in stool.  Endocrine: Negative.   Genitourinary: Negative.   Musculoskeletal: Positive for myalgias and arthralgias. Negative for back pain and neck pain.  Skin: Negative.  Negative for rash.  Allergic/Immunologic: Negative.   Neurological: Negative.  Negative for dizziness, tremors, weakness, light-headedness, numbness and headaches.  Hematological: Negative.  Negative for adenopathy. Does not bruise/bleed easily.  Psychiatric/Behavioral: Positive for sleep disturbance. Negative for suicidal ideas, hallucinations, behavioral problems, confusion, self-injury, dysphoric mood, decreased concentration and agitation. The patient is nervous/anxious. The patient is not hyperactive.     Objective:  BP 130/80 mmHg  Pulse 78  Temp(Src) 98.7 F (37.1  C) (Oral)  Ht 5\' 6"  (1.676 m)  Wt 196 lb (88.905 kg)  BMI 31.65 kg/m2  SpO2 98%  BP Readings from Last 3 Encounters:  12/01/14 130/80  11/17/14 140/76  11/08/14 146/92    Wt Readings from Last 3 Encounters:  12/01/14 196 lb (88.905 kg)  11/08/14 192 lb (87.091 kg)  11/08/14 191 lb (86.637 kg)    Physical Exam  Constitutional: She is oriented to person, place, and time. No distress.  HENT:  Head: Normocephalic and atraumatic.  Mouth/Throat: Oropharynx is clear and moist. No oropharyngeal exudate.  Eyes: Conjunctivae are normal. Right eye exhibits no discharge. Left eye exhibits no discharge. No scleral icterus.  Neck: Normal range of motion. Neck supple. No JVD present. No tracheal deviation present. No thyromegaly present.  Cardiovascular: Normal rate, regular rhythm, normal heart sounds and intact distal pulses.  Exam reveals no gallop and  no friction rub.   No murmur heard. Pulmonary/Chest: Effort normal and breath sounds normal. No stridor. No respiratory distress. She has no wheezes. She has no rales. She exhibits no tenderness.  Abdominal: Soft. Bowel sounds are normal. She exhibits no distension and no mass. There is no tenderness. There is no rebound and no guarding.  Musculoskeletal: Normal range of motion. She exhibits no edema or tenderness.  Lymphadenopathy:    She has no cervical adenopathy.  Neurological: She is oriented to person, place, and time.  Skin: Skin is warm and dry. No rash noted. She is not diaphoretic. No erythema. No pallor.  Psychiatric: She has a normal mood and affect. Her behavior is normal. Judgment and thought content normal.  Vitals reviewed.   Lab Results  Component Value Date   WBC 8.0 09/22/2014   HGB 12.4 09/22/2014   HCT 36.3 09/22/2014   PLT 242 09/22/2014   GLUCOSE 87 11/08/2014   CHOL 172 02/03/2014   TRIG 151.0* 02/03/2014   HDL 32.00* 02/03/2014   LDLCALC 110* 02/03/2014   ALT 27 09/20/2014   AST 28 09/20/2014   NA 138  11/08/2014   K 3.1* 11/08/2014   CL 102 11/08/2014   CREATININE 0.93 11/08/2014   BUN 13 11/08/2014   CO2 30 11/08/2014   TSH 0.570 09/20/2014   INR 1.1* 06/03/2012   HGBA1C 5.7 11/08/2014    Ct Head Wo Contrast  09/20/2014   CLINICAL DATA:  39 year old female with altered mental status.  EXAM: CT HEAD WITHOUT CONTRAST  TECHNIQUE: Contiguous axial images were obtained from the base of the skull through the vertex without intravenous contrast.  COMPARISON:  09/18/2014 head CT and prior exams  FINDINGS: No intracranial abnormalities are identified, including mass lesion or mass effect, hydrocephalus, extra-axial fluid collection, midline shift, hemorrhage, or acute infarction.  The visualized bony calvarium is unremarkable.  IMPRESSION: Unremarkable noncontrast head CT.   Electronically Signed   By: Margarette Canada M.D.   On: 09/20/2014 16:55   Dg Chest Port 1 View  09/21/2014   CLINICAL DATA:  Patient status post central line placement.  EXAM: PORTABLE CHEST - 1 VIEW  COMPARISON:  09/20/2014  FINDINGS: Patient rotated to the right. Interval insertion of right internal jugular central venous catheter with tip projecting over the expected location of the superior vena cava. Stable enlarged cardiac and mediastinal contours. Monitoring leads overlie the patient. Minimal bibasilar heterogeneous opacities.  IMPRESSION: Right IJ central venous catheter tip projects over the expected location of the superior vena cava.   Electronically Signed   By: Lovey Newcomer M.D.   On: 09/21/2014 00:26   Dg Chest Port 1 View  09/20/2014   CLINICAL DATA:  Confusion  EXAM: PORTABLE CHEST - 1 VIEW  COMPARISON:  09/18/2014  FINDINGS: Hypoventilation with decreased lung polyp thin mild left lower lobe atelectasis. No definite pneumonia or heart failure. Cardiac enlargement, partially due to low lung volumes.  IMPRESSION: Hypoventilation with mild left lower lobe atelectasis.   Electronically Signed   By: Franchot Gallo M.D.   On:  09/20/2014 15:49    Assessment & Plan:   Filomena was seen today for hypertension.  Diagnoses and all orders for this visit:  Need for vaccination with 13-polyvalent pneumococcal conjugate vaccine -     Pneumococcal conjugate vaccine 13-valent  Essential hypertension- she tells me that she sees pain management soon about her discomfort. Today her blood pressure is well-controlled and her electrolytes and renal function are stable. She  will continue the current medications with no changes.   I am having Ms. Watanabe maintain her nitroGLYCERIN, montelukast, albuterol, EPINEPHrine, ondansetron, isosorbide mononitrate, FLUTTER, Biotin, hyoscyamine, pregabalin, acetaZOLAMIDE, albuterol, sucralfate, metoCLOPramide, furosemide, dextromethorphan-guaiFENesin, potassium chloride SA, chlorpheniramine, Nebivolol HCl, traMADol, LORazepam, budesonide-formoterol, cloNIDine, hydrALAZINE, pantoprazole, mometasone, isosorbide mononitrate, diltiazem, spironolactone, primidone, and DEXILANT.  Meds ordered this encounter  Medications  . DEXILANT 60 MG capsule    Sig: TK ONE C PO D    Refill:  3     Follow-up: No Follow-up on file.  Scarlette Calico, MD

## 2014-12-01 NOTE — Patient Instructions (Signed)

## 2014-12-05 ENCOUNTER — Encounter (HOSPITAL_COMMUNITY): Payer: Self-pay | Admitting: Emergency Medicine

## 2014-12-05 ENCOUNTER — Emergency Department (HOSPITAL_COMMUNITY)
Admission: EM | Admit: 2014-12-05 | Discharge: 2014-12-05 | Disposition: A | Discharge status: Home / Self Care | Payer: Medicare Other | Attending: Emergency Medicine | Admitting: Emergency Medicine

## 2014-12-05 DIAGNOSIS — J45901 Unspecified asthma with (acute) exacerbation: Secondary | ICD-10-CM | POA: Diagnosis not present

## 2014-12-05 DIAGNOSIS — G4733 Obstructive sleep apnea (adult) (pediatric): Secondary | ICD-10-CM | POA: Diagnosis not present

## 2014-12-05 DIAGNOSIS — Z853 Personal history of malignant neoplasm of breast: Secondary | ICD-10-CM | POA: Insufficient documentation

## 2014-12-05 DIAGNOSIS — Z7951 Long term (current) use of inhaled steroids: Secondary | ICD-10-CM | POA: Insufficient documentation

## 2014-12-05 DIAGNOSIS — Z79899 Other long term (current) drug therapy: Secondary | ICD-10-CM | POA: Diagnosis not present

## 2014-12-05 DIAGNOSIS — R062 Wheezing: Secondary | ICD-10-CM | POA: Diagnosis present

## 2014-12-05 DIAGNOSIS — I10 Essential (primary) hypertension: Secondary | ICD-10-CM | POA: Insufficient documentation

## 2014-12-05 DIAGNOSIS — Z9981 Dependence on supplemental oxygen: Secondary | ICD-10-CM | POA: Insufficient documentation

## 2014-12-05 DIAGNOSIS — K219 Gastro-esophageal reflux disease without esophagitis: Secondary | ICD-10-CM | POA: Insufficient documentation

## 2014-12-05 DIAGNOSIS — Z880 Allergy status to penicillin: Secondary | ICD-10-CM | POA: Diagnosis not present

## 2014-12-05 DIAGNOSIS — Z862 Personal history of diseases of the blood and blood-forming organs and certain disorders involving the immune mechanism: Secondary | ICD-10-CM | POA: Diagnosis not present

## 2014-12-05 MED ORDER — PREDNISONE 20 MG PO TABS
60.0000 mg | ORAL_TABLET | Freq: Once | ORAL | Status: AC
  Administered 2014-12-05: 60 mg via ORAL
  Filled 2014-12-05: qty 3

## 2014-12-05 MED ORDER — PREDNISONE 20 MG PO TABS: ORAL_TABLET | ORAL | Status: DC

## 2014-12-05 MED ORDER — ALBUTEROL (5 MG/ML) CONTINUOUS INHALATION SOLN
15.0000 mg | INHALATION_SOLUTION | Freq: Once | RESPIRATORY_TRACT | Status: AC
  Administered 2014-12-05: 15 mg via RESPIRATORY_TRACT
  Filled 2014-12-05: qty 20

## 2014-12-05 NOTE — ED Notes (Signed)
Pt. Walked around the emergency department and her oxygen level was 100% throughout the entire walk. Pt. Stated that she felt well through the walk.

## 2014-12-05 NOTE — ED Notes (Signed)
Pt alert x4 respirations easy non labored.  

## 2014-12-05 NOTE — ED Notes (Signed)
Pt c/o asthma exacerbation onset yesterday, worsened today at 0300. Pt states episode began after emptying vacuum cleaner. Expiratory wheeze.

## 2014-12-05 NOTE — Discharge Instructions (Signed)
Asthma Start taking the prednisone prescribed tomorrow, as you've already received today's dose. Use your albuterol inhaler 2 puffs every 4 hours or your nebulizer every 4 hours as needed for shortness of breath. Return if needed more than every 4 hours. Keep your scheduled appointment with Dr.Wert next week. Asthma is a condition of the lungs in which the airways tighten and narrow. Asthma can make it hard to breathe. Asthma cannot be cured, but medicine and lifestyle changes can help control it. Asthma may be started (triggered) by:  Animal skin flakes (dander).  Dust.  Cockroaches.  Pollen.  Mold.  Smoke.  Cleaning products.  Hair sprays or aerosol sprays.  Paint fumes or strong smells.  Cold air, weather changes, and winds.  Crying or laughing hard.  Stress.  Certain medicines or drugs.  Foods, such as dried fruit, potato chips, and sparkling grape juice.  Infections or conditions (colds, flu).  Exercise.  Certain medical conditions or diseases.  Exercise or tiring activities. HOME CARE   Take medicine as told by your doctor.  Use a peak flow meter as told by your doctor. A peak flow meter is a tool that measures how well the lungs are working.  Record and keep track of the peak flow meter's readings.  Understand and use the asthma action plan. An asthma action plan is a written plan for taking care of your asthma and treating your attacks.  To help prevent asthma attacks:  Do not smoke. Stay away from secondhand smoke.  Change your heating and air conditioning filter often.  Limit your use of fireplaces and wood stoves.  Get rid of pests (such as roaches and mice) and their droppings.  Throw away plants if you see mold on them.  Clean your floors. Dust regularly. Use cleaning products that do not smell.  Have someone vacuum when you are not home. Use a vacuum cleaner with a HEPA filter if possible.  Replace carpet with wood, tile, or vinyl  flooring. Carpet can trap animal skin flakes and dust.  Use allergy-proof pillows, mattress covers, and box spring covers.  Wash bed sheets and blankets every week in hot water and dry them in a dryer.  Use blankets that are made of polyester or cotton.  Clean bathrooms and kitchens with bleach. If possible, have someone repaint the walls in these rooms with mold-resistant paint. Keep out of the rooms that are being cleaned and painted.  Wash hands often. GET HELP IF:  You have make a whistling sound when breaking (wheeze), have shortness of breath, or have a cough even if taking medicine to prevent attacks.  The colored mucus you cough up (sputum) is thicker than usual.  The colored mucus you cough up changes from clear or white to yellow, green, gray, or bloody.  You have problems from the medicine you are taking such as:  A rash.  Itching.  Swelling.  Trouble breathing.  You need reliever medicines more than 2-3 times a week.  Your peak flow measurement is still at 50-79% of your personal best after following the action plan for 1 hour.  You have a fever. GET HELP RIGHT AWAY IF:   You seem to be worse and are not responding to medicine during an asthma attack.  You are short of breath even at rest.  You get short of breath when doing very little activity.  You have trouble eating, drinking, or talking.  You have chest pain.  You have a fast heartbeat.  Your lips or fingernails start to turn blue.  You are light-headed, dizzy, or faint.  Your peak flow is less than 50% of your personal best. MAKE SURE YOU:   Understand these instructions.  Will watch your condition.  Will get help right away if you are not doing well or get worse. Document Released: 09/04/2007 Document Revised: 08/02/2013 Document Reviewed: 10/15/2012 [facility] Patient Information 2015 [place], [place]. This information is not intended to replace advice given to you by your health care  provider. Make sure you discuss any questions you have with your health care provider.

## 2014-12-05 NOTE — ED Provider Notes (Signed)
CSN: [id number]     Arrival date & time 12/05/14  1601 History   First MD Initiated Contact with Patient 12/05/14 [phone]     Chief Complaint  Patient presents with  . Asthma     (Consider location/radiation/quality/duration/timing/severity/associated sxs/prior Treatment) HPI Complains of asthma and wheezing onset 3 AM today after emptying her vacuum cleaner while at home. She treated herself with 2 albuterol nebulized treatments without relief. Other associated symptoms include cough denies fever no other associated symptoms. Nothing makes symptoms better or worse. No other associated symptoms Past Medical History  Diagnosis Date  . Asthma   . Hypertension   . GERD (gastroesophageal reflux disease)   . Impaired glucose tolerance 04/13/2011  . Anemia, unspecified 04/13/2011  . Asthma 09/27/2010  . Bloating 03/18/2011  . 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 Lymphadenitis, chronic     restricted LEFT extremity  . Dyspnea on exertion     a. 01/2013 Lexi MV: EF 54%, no ischemia/infarct;  b. 05/2013 Echo: EF 60-65%, no rwma, Gr 2 DD;  b.   . OSA (obstructive sleep apnea) 05/04/2014    AHI 31/hr now on CPAP at 12cm H2O  . Breast cancer 2010    a. locally advanced left breast carcinoma diagnosed in 2010 and treated with neoadjuvant chemotherapy with docetaxel and cyclophosphamide as well as paclitaxel. This was followed by radiation therapy which was completed in 2011.  . Fatty liver   . Gastroparesis    no history of intubation. She has been on spirits in the past for asthma. She reports 3 or 4 ED visits within the past year for asthma Past Surgical History  Procedure Laterality Date  . Tendon release Right 2012    "hand"  . Breast lumpectomy Left 09/2008  . Carpal tunnel release Left 2011  . Tubal ligation  05/11/2011    Procedure: POST PARTUM TUBAL LIGATION;  Surgeon: Emeterio Reeve, MD;  Location: [facility];  Service: Gynecology;  Laterality: Bilateral;  Induced for HTN  . Lymph node dissection  Left 2010    breast; 2 wks after breast lumpectomy   Family History  Problem Relation Age of Onset  . Diabetes Maternal Grandmother   . Heart disease Maternal Grandmother   . Hypertension Maternal Grandmother   . Cancer Neg Hx   . Alcohol abuse Neg Hx   . Early death Neg Hx   . Hyperlipidemia Neg Hx   . Kidney disease Neg Hx   . Stroke Neg Hx   . Colon cancer Neg Hx    Social History  Substance Use Topics  . Smoking status: Never Smoker   . Smokeless tobacco: Never Used  . Alcohol Use: 0.0 oz/week     Comment: occasionally   OB History    Gravida Para Term Preterm AB TAB SAB Ectopic Multiple Living   5 4 4  1  1   4      Review of Systems  Constitutional: Negative.   HENT: Negative.   Respiratory: Positive for cough and wheezing.   Cardiovascular: Negative.   Gastrointestinal: Negative.   Musculoskeletal: Negative.   Skin: Negative.   Neurological: Negative.   Psychiatric/Behavioral: Negative.   All other systems reviewed and are negative.     Allergies  Aspirin; Doxycycline; Penicillins; Kiwi extract; and Strawberry  Home Medications   Prior to Admission medications   Medication Sig Start Date End Date Taking? Authorizing Provider  acetaZOLAMIDE (DIAMOX) 250 MG tablet Take 4 tablets (1,000 mg total) by mouth 3 (  three) times daily. Patient taking differently: Take 4 tablets by mouth four times a day 08/19/14   Donika K Patel, DO  albuterol (PROVENTIL HFA;VENTOLIN HFA) 108 (90 BASE) MCG/ACT inhaler Inhale 2 puffs into the lungs every 4 (four) hours as needed for wheezing or shortness of breath (((PLAN A))). Patient taking differently: Inhale 2 puffs into the lungs every 4 (four) hours as needed for wheezing or shortness of breath (((PLAN B))).  08/30/14   Tammy S Parrett, NP  albuterol (PROVENTIL) (2.5 MG/3ML) 0.083% nebulizer solution Take 3 mLs (2.5 mg total) by nebulization every 4 (four) hours as needed for wheezing or shortness of breath (((PLAN B))). Patient  taking differently: Take 2.5 mg by nebulization every 4 (four) hours as needed for wheezing or shortness of breath (((PLAN C))).  01/26/14   Tammy S Parrett, NP  Biotin 10 MG TABS Take 1 tablet by mouth every morning.     Historical Provider, MD  budesonide-formoterol (SYMBICORT) 160-4.5 MCG/ACT inhaler Take 2 puffs first thing in am and then another 2 puffs about 12 hours later. 11/08/14   Tammy S Parrett, NP  chlorpheniramine (CHLOR-TRIMETON) 4 MG tablet Take 8 mg by mouth at bedtime.    Historical Provider, MD  cloNIDine (CATAPRES) 0.2 MG tablet Take 1 tablet (0.2 mg total) by mouth 2 (two) times daily. Patient taking differently: Take 0.3 mg by mouth 2 (two) times daily.  11/08/14   Biagio Borg, MD  DEXILANT 60 MG capsule TK ONE C PO D 11/08/14   Historical Provider, MD  dextromethorphan-guaiFENesin [facility] DM) 30-600 MG per 12 hr tablet Take 2 tablets by mouth 2 (two) times daily as needed (for cough, congestion, and thick mucus).     Historical Provider, MD  diltiazem (CARDIZEM CD) 240 MG 24 hr capsule Take 240 mg by mouth every morning.    Historical Provider, MD  EPINEPHrine 0.3 mg/0.3 mL IJ SOAJ injection Inject 0.3 mLs (0.3 mg total) into the muscle once. Patient taking differently: Use as directed for allergic reaction 01/28/14   [name], MD  furosemide (LASIX) 40 MG tablet Take 40 mg by mouth daily as needed.    Historical Provider, MD  hydrALAZINE (APRESOLINE) 100 MG tablet Take 1 tablet (100 mg total) by mouth 3 (three) times daily. 11/08/14   Biagio Borg, MD  hyoscyamine (LEVSIN SL) 0.125 MG SL tablet Place 1 tablet (0.125 mg total) under the tongue every 8 (eight) hours as needed for cramping. Patient taking differently: Place 0.125 mg under the tongue 3 (three) times daily.  08/17/14   Jerene Bears, MD  isosorbide mononitrate (IMDUR) 60 MG 24 hr tablet Take 1 tablet (60 mg total) by mouth daily. 02/08/14   [name], MD  isosorbide mononitrate (IMDUR) 60 MG 24 hr tablet Take  60 mg by mouth every morning.    Historical Provider, MD  LORazepam (ATIVAN) 0.5 MG tablet Take 1 tablet (0.5 mg total) by mouth every 8 (eight) hours as needed. for anxiety 10/31/14   Chauncey Cruel, MD  metoCLOPramide (REGLAN) 10 MG tablet TAKE 1 TABLET BY MOUTH FOUR TIMES DAILY WITH MEALS AND AT BEDTIME 09/12/14   Jerene Bears, MD  mometasone (NASONEX) 50 MCG/ACT nasal spray Place 2 sprays into the nose 2 (two) times daily.    Historical Provider, MD  montelukast (SINGULAIR) 10 MG tablet Take 1 tablet (10 mg total) by mouth at bedtime. 01/26/14   Tammy S Parrett, NP  Nebivolol HCl (BYSTOLIC) 20  MG TABS 1 tab by mouth per day Patient taking differently: Take 20 mg by mouth every morning.  10/20/14   Biagio Borg, MD  nitroGLYCERIN (NITROSTAT) 0.4 MG SL tablet Place 1 tablet (0.4 mg total) under the tongue every 5 (five) minutes as needed for chest pain. 08/24/13   [name], MD  ondansetron (ZOFRAN-ODT) 4 MG disintegrating tablet Take 1 tablet (4 mg total) by mouth every 6 (six) hours as needed for nausea or vomiting. Patient taking differently: Take 4 mg by mouth every 6 (six) hours as needed for nausea.  01/28/14   [name], MD  pantoprazole (PROTONIX) 40 MG tablet Take 40 mg by mouth 2 (two) times daily before a meal.    Historical Provider, MD  potassium chloride SA (K-DUR,KLOR-CON) 20 MEQ tablet Take 20 mEq by mouth 3 (three) times daily.    Historical Provider, MD  pregabalin (LYRICA) 200 MG capsule Take 1 capsule (200 mg total) by mouth 3 (three) times daily. 08/19/14   Donika Keith Rake, DO  primidone (MYSOLINE) 50 MG tablet Take 1/2 tablet by mouth every morning    Historical Provider, MD  Respiratory Therapy Supplies (FLUTTER) DEVI Use as directed. 04/19/14   Tammy S Parrett, NP  spironolactone (ALDACTONE) 25 MG tablet Take 1 tablet by mouth every morning and 2 tablets by mouth at bedtime    Historical Provider, MD  sucralfate (CARAFATE) 1 G tablet Take 1 g by mouth 4 (four) times  daily.    Historical Provider, MD   BP 133/65 mmHg  Pulse 79  Temp(Src) 97.7 F (36.5 C) (Oral)  Resp 20  Wt 192 lb (87.091 kg)  SpO2 100% Physical Exam  Constitutional: She appears well-developed and well-nourished. No distress.  HENT:  Head: Normocephalic and atraumatic.  Eyes: Conjunctivae are normal. Pupils are equal, round, and reactive to light.  Neck: Neck supple. No tracheal deviation present. No thyromegaly present.  Cardiovascular: Normal rate and regular rhythm.   No murmur heard. Pulmonary/Chest: Effort normal and breath sounds normal. No respiratory distress.  Prolonged expiratory phase with end expiratory wheezes. No respiratory distress Speaks in paragraphs  Abdominal: Soft. Bowel sounds are normal. She exhibits no distension. There is no tenderness.  Musculoskeletal: Normal range of motion. She exhibits no edema or tenderness.  Neurological: She is alert. Coordination normal.  Skin: Skin is warm and dry. No rash noted.  Psychiatric: She has a normal mood and affect.  Nursing note and vitals reviewed.   ED Course  Procedures (including critical care time) Labs Review Labs Reviewed - No data to display  Imaging Review No results found. I have personally reviewed and evaluated these images and lab results as part of my medical decision-making.   EKG Interpretation None     10:50 AM patient's breathing is normal after treatment with continuous nebulization for 1 hour, with albuterol and prednisone by mouth. MDM  Plan prescription prednisone. She is instructed to use albuterol nebulizer or inhaler every 4 hours as needed return if needed more than every 4 hours keep scheduled appointment with Dr.WERT next week Final diagnoses:  None   Diagnosis exacerbation of asthma     Orlie Dakin, MD 12/05/14 1057

## 2014-12-06 ENCOUNTER — Ambulatory Visit (INDEPENDENT_AMBULATORY_CARE_PROVIDER_SITE_OTHER): Payer: Medicare Other

## 2014-12-06 ENCOUNTER — Ambulatory Visit: Payer: Medicare Other | Admitting: Internal Medicine

## 2014-12-06 DIAGNOSIS — Z23 Encounter for immunization: Secondary | ICD-10-CM | POA: Diagnosis not present

## 2014-12-08 ENCOUNTER — Encounter: Payer: Self-pay | Admitting: Obstetrics & Gynecology

## 2014-12-08 ENCOUNTER — Ambulatory Visit (INDEPENDENT_AMBULATORY_CARE_PROVIDER_SITE_OTHER): Payer: Medicare Other | Admitting: Obstetrics & Gynecology

## 2014-12-08 VITALS — BP 182/98 | HR 80 | Temp 98.6°F | Ht 66.0 in | Wt 195.4 lb

## 2014-12-08 DIAGNOSIS — N939 Abnormal uterine and vaginal bleeding, unspecified: Secondary | ICD-10-CM

## 2014-12-08 LAB — POCT PREGNANCY, URINE: Preg Test, Ur: NEGATIVE

## 2014-12-08 NOTE — Patient Instructions (Signed)
Abnormal Uterine Bleeding Abnormal uterine bleeding can affect women at various stages in life, including teenagers, women in their reproductive years, pregnant women, and women who have reached menopause. Several kinds of uterine bleeding are considered abnormal, including:  Bleeding or spotting between periods.   Bleeding after sexual intercourse.   Bleeding that is heavier or more than normal.   Periods that last longer than usual.  Bleeding after menopause.  Many cases of abnormal uterine bleeding are minor and simple to treat, while others are more serious. Any type of abnormal bleeding should be evaluated by your health care provider. Treatment will depend on the cause of the bleeding. HOME CARE INSTRUCTIONS Monitor your condition for any changes. The following actions may help to alleviate any discomfort you are experiencing:  Avoid the use of tampons and douches as directed by your health care provider.  Change your pads frequently. You should get regular pelvic exams and Pap tests. Keep all follow-up appointments for diagnostic tests as directed by your health care provider.  SEEK MEDICAL CARE IF:   Your bleeding lasts more than 1 week.   You feel dizzy at times.  SEEK IMMEDIATE MEDICAL CARE IF:   You pass out.   You are changing pads every 15 to 30 minutes.   You have abdominal pain.  You have a fever.   You become sweaty or weak.   You are passing large blood clots from the vagina.   You start to feel nauseous and vomit. MAKE SURE YOU:   Understand these instructions.  Will watch your condition.  Will get help right away if you are not doing well or get worse. Document Released: 03/18/2005 Document Revised: 03/23/2013 Document Reviewed: 10/15/2012 [employer] Patient Information 2015 [employer]. This information is not intended to replace advice given to you by your health care provider. Make sure you discuss any questions you have with your  health care provider.  

## 2014-12-08 NOTE — Progress Notes (Signed)
Patient ID: Erica [name], female   DOB: Nov 06, 1975, 39 y.o.   MRN: [id number]  Chief Complaint  Patient presents with  . Vaginal Bleeding  abnormal on Zoladex since January. Heavy menses as well as intermittent bleeding  HPI Erica [name] is a 39 y.o. female.  [id number] Patient's last menstrual period was 11/24/2014. S/P BTL, on adjuvant tx for breast cancer. Still menses and intermenstrual bleeding on Goresalin injestions. Was to see Dr. Denman George about possible oophorectomy. HPI  Past Medical History  Diagnosis Date  . Asthma   . Hypertension   . GERD (gastroesophageal reflux disease)   . Impaired glucose tolerance 04/13/2011  . Anemia, unspecified 04/13/2011  . Asthma 09/27/2010  . Bloating 03/18/2011  . 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 Lymphadenitis, chronic     restricted LEFT extremity  . Dyspnea on exertion     a. 01/2013 Lexi MV: EF 54%, no ischemia/infarct;  b. 05/2013 Echo: EF 60-65%, no rwma, Gr 2 DD;  b.   . OSA (obstructive sleep apnea) 05/04/2014    AHI 31/hr now on CPAP at 12cm H2O  . Breast cancer 2010    a. locally advanced left breast carcinoma diagnosed in 2010 and treated with neoadjuvant chemotherapy with docetaxel and cyclophosphamide as well as paclitaxel. This was followed by radiation therapy which was completed in 2011.  . Fatty liver   . Gastroparesis     Past Surgical History  Procedure Laterality Date  . Tendon release Right 2012    "hand"  . Breast lumpectomy Left 09/2008  . Carpal tunnel release Left 2011  . Tubal ligation  05/11/2011    Procedure: POST PARTUM TUBAL LIGATION;  Surgeon: Emeterio Reeve, MD;  Location: [facility];  Service: Gynecology;  Laterality: Bilateral;  Induced for HTN  . Lymph node dissection Left 2010    breast; 2 wks after breast lumpectomy    Family History  Problem Relation Age of Onset  . Diabetes Maternal Grandmother   . Heart disease Maternal Grandmother   . Hypertension Maternal Grandmother   . Cancer Neg Hx   . Alcohol  abuse Neg Hx   . Early death Neg Hx   . Hyperlipidemia Neg Hx   . Kidney disease Neg Hx   . Stroke Neg Hx   . Colon cancer Neg Hx     Social History Social History  Substance Use Topics  . Smoking status: Never Smoker   . Smokeless tobacco: Never Used  . Alcohol Use: 0.0 oz/week     Comment: occasionally    Allergies  Allergen Reactions  . Aspirin Shortness Of Breath and Palpitations    Pt can take ibuprofen without reaction  . Doxycycline Anaphylaxis  . Penicillins Shortness Of Breath and Palpitations  . Kiwi Extract Itching and Swelling    Lip swelling  . Strawberry Hives    Current Outpatient Prescriptions  Medication Sig Dispense Refill  . acetaZOLAMIDE (DIAMOX) 250 MG tablet Take 4 tablets (1,000 mg total) by mouth 3 (three) times daily. (Patient taking differently: Take 4 tablets by mouth four times a day) 400 tablet 5  . albuterol (PROVENTIL HFA;VENTOLIN HFA) 108 (90 BASE) MCG/ACT inhaler Inhale 2 puffs into the lungs every 4 (four) hours as needed for wheezing or shortness of breath (((PLAN A))). (Patient taking differently: Inhale 2 puffs into the lungs every 4 (four) hours as needed for wheezing or shortness of breath (((PLAN B))). ) 18 g 5  . albuterol (PROVENTIL) (2.5 MG/3ML) 0.083% nebulizer solution  Take 3 mLs (2.5 mg total) by nebulization every 4 (four) hours as needed for wheezing or shortness of breath (((PLAN B))). (Patient taking differently: Take 2.5 mg by nebulization every 4 (four) hours as needed for wheezing or shortness of breath (((PLAN C))). ) 300 mL 5  . Biotin 10 MG TABS Take 1 tablet by mouth every morning.     . budesonide-formoterol (SYMBICORT) 160-4.5 MCG/ACT inhaler Take 2 puffs first thing in am and then another 2 puffs about 12 hours later. 1 Inhaler 5  . chlorpheniramine (CHLOR-TRIMETON) 4 MG tablet Take 8 mg by mouth at bedtime.    . cloNIDine (CATAPRES) 0.2 MG tablet Take 1 tablet (0.2 mg total) by mouth 2 (two) times daily. 60 tablet 11  .  dexlansoprazole (DEXILANT) 60 MG capsule Take 60 mg by mouth daily.    Marland Kitchen dextromethorphan-guaiFENesin (MUCINEX DM) 30-600 MG per 12 hr tablet Take 2 tablets by mouth 2 (two) times daily as needed (for cough, congestion, and thick mucus).     Marland Kitchen diltiazem (CARDIZEM CD) 240 MG 24 hr capsule Take 240 mg by mouth every morning.    Marland Kitchen EPINEPHrine 0.3 mg/0.3 mL IJ SOAJ injection Inject 0.3 mLs (0.3 mg total) into the muscle once. (Patient taking differently: Use as directed for allergic reaction) 2 Device 1  . furosemide (LASIX) 40 MG tablet Take 40 mg by mouth daily as needed for fluid.     . hydrALAZINE (APRESOLINE) 100 MG tablet Take 1 tablet (100 mg total) by mouth 3 (three) times daily. 90 tablet 11  . hyoscyamine (LEVSIN SL) 0.125 MG SL tablet Place 1 tablet (0.125 mg total) under the tongue every 8 (eight) hours as needed for cramping. (Patient taking differently: Place 0.125 mg under the tongue 3 (three) times daily. ) 30 tablet 2  . isosorbide mononitrate (IMDUR) 60 MG 24 hr tablet Take 1 tablet (60 mg total) by mouth daily. 30 tablet 11  . LORazepam (ATIVAN) 0.5 MG tablet Take 1 tablet (0.5 mg total) by mouth every 8 (eight) hours as needed. for anxiety 90 tablet 0  . metoCLOPramide (REGLAN) 10 MG tablet TAKE 1 TABLET BY MOUTH FOUR TIMES DAILY WITH MEALS AND AT BEDTIME 120 tablet 0  . mometasone (NASONEX) 50 MCG/ACT nasal spray Place 2 sprays into the nose 2 (two) times daily.    . montelukast (SINGULAIR) 10 MG tablet Take 1 tablet (10 mg total) by mouth at bedtime. 30 tablet 5  . Nebivolol HCl (BYSTOLIC) 20 MG TABS 1 tab by mouth per day (Patient taking differently: Take 20 mg by mouth every morning. ) 90 tablet 3  . nitroGLYCERIN (NITROSTAT) 0.4 MG SL tablet Place 1 tablet (0.4 mg total) under the tongue every 5 (five) minutes as needed for chest pain. 90 tablet 3  . ondansetron (ZOFRAN-ODT) 4 MG disintegrating tablet Take 1 tablet (4 mg total) by mouth every 6 (six) hours as needed for nausea or  vomiting. (Patient taking differently: Take 4 mg by mouth every 6 (six) hours as needed for nausea. ) 20 tablet 5  . potassium chloride SA (K-DUR,KLOR-CON) 20 MEQ tablet Take 20 mEq by mouth 3 (three) times daily.    . predniSONE (DELTASONE) 20 MG tablet 2 tabs po daily x 3 days 6 tablet 0  . pregabalin (LYRICA) 200 MG capsule Take 1 capsule (200 mg total) by mouth 3 (three) times daily. 90 capsule 5  . primidone (MYSOLINE) 50 MG tablet Take 25 mg by mouth at bedtime.     Marland Kitchen  spironolactone (ALDACTONE) 25 MG tablet Take 1 tablet by mouth every morning and 2 tablets by mouth at bedtime    . sucralfate (CARAFATE) 1 G tablet Take 1 g by mouth 4 (four) times daily.     No current facility-administered medications for this visit.    Review of Systems Review of Systems  Constitutional: Negative.   Respiratory: Negative.   Genitourinary: Positive for vaginal bleeding and menstrual problem. Negative for vaginal discharge and pelvic pain.    Blood pressure 182/98, pulse 80, temperature 98.6 F (37 C), height 5\' 6"  (1.676 m), weight 195 lb 6.4 oz (88.633 kg), last menstrual period 11/24/2014.  Physical Exam Physical Exam  Constitutional: She appears well-developed. No distress.  Pulmonary/Chest: Effort normal.  Abdominal: Soft.  Genitourinary: Vagina normal and uterus normal. No vaginal discharge found.  Skin: Skin is warm and dry.  Psychiatric: She has a normal mood and affect. Her behavior is normal.    Data Reviewed Office notes and referral  Assessment    DUB on gnrh analog, h/o breast cancer     Plan    US pelvic RTC to review and consider if Bx is indicated May still need to consult Dr Denman George re oophorectomy indication       Janitza Revuelta 12/08/2014, 2:21 PM

## 2014-12-15 ENCOUNTER — Ambulatory Visit (HOSPITAL_BASED_OUTPATIENT_CLINIC_OR_DEPARTMENT_OTHER): Payer: Medicare Other

## 2014-12-15 VITALS — BP 182/98 | HR 97 | Temp 98.8°F

## 2014-12-15 DIAGNOSIS — C50412 Malignant neoplasm of upper-outer quadrant of left female breast: Secondary | ICD-10-CM | POA: Diagnosis not present

## 2014-12-15 DIAGNOSIS — Z5111 Encounter for antineoplastic chemotherapy: Secondary | ICD-10-CM

## 2014-12-15 MED ORDER — GOSERELIN ACETATE 3.6 MG ~~LOC~~ IMPL
3.6000 mg | DRUG_IMPLANT | Freq: Once | SUBCUTANEOUS | Status: AC
  Administered 2014-12-15: 3.6 mg via SUBCUTANEOUS
  Filled 2014-12-15: qty 3.6

## 2014-12-15 NOTE — Progress Notes (Signed)
Erica [name] states that she has been having some stressful days lately, which could account for her elevated BP.  She has separated from her significant other.  Holding back tears, thinking about their children (41 yr old and 78 yr old).  Told her to watch her BP and if it doesn't come down to see her PCP

## 2014-12-16 ENCOUNTER — Ambulatory Visit (HOSPITAL_COMMUNITY)
Admission: RE | Admit: 2014-12-16 | Discharge: 2014-12-16 | Disposition: A | Discharge status: Home / Self Care | Payer: Medicare Other | Source: Ambulatory Visit | Attending: Obstetrics & Gynecology | Admitting: Obstetrics & Gynecology

## 2014-12-16 DIAGNOSIS — Z9851 Tubal ligation status: Secondary | ICD-10-CM | POA: Insufficient documentation

## 2014-12-16 DIAGNOSIS — N939 Abnormal uterine and vaginal bleeding, unspecified: Secondary | ICD-10-CM | POA: Diagnosis not present

## 2014-12-16 DIAGNOSIS — N920 Excessive and frequent menstruation with regular cycle: Secondary | ICD-10-CM | POA: Diagnosis not present

## 2014-12-16 DIAGNOSIS — Z853 Personal history of malignant neoplasm of breast: Secondary | ICD-10-CM | POA: Diagnosis not present

## 2014-12-26 ENCOUNTER — Other Ambulatory Visit: Payer: Self-pay | Admitting: Internal Medicine

## 2014-12-26 ENCOUNTER — Other Ambulatory Visit: Payer: Self-pay | Admitting: Oncology

## 2014-12-26 MED ORDER — METOCLOPRAMIDE HCL 10 MG PO TABS: ORAL_TABLET | ORAL | Status: DC

## 2014-12-26 NOTE — Addendum Note (Signed)
Addended by: Larina Bras on: 12/26/2014 04:45 PM   Modules accepted: Orders

## 2015-01-02 ENCOUNTER — Other Ambulatory Visit: Payer: Self-pay | Admitting: Oncology

## 2015-01-04 ENCOUNTER — Other Ambulatory Visit: Payer: Self-pay

## 2015-01-04 DIAGNOSIS — F411 Generalized anxiety disorder: Secondary | ICD-10-CM

## 2015-01-04 DIAGNOSIS — C50412 Malignant neoplasm of upper-outer quadrant of left female breast: Secondary | ICD-10-CM

## 2015-01-04 MED ORDER — LORAZEPAM 0.5 MG PO TABS: 0.5000 mg | ORAL_TABLET | Freq: Three times a day (TID) | ORAL | Status: DC | PRN

## 2015-01-12 ENCOUNTER — Ambulatory Visit (HOSPITAL_BASED_OUTPATIENT_CLINIC_OR_DEPARTMENT_OTHER): Payer: Medicare Other

## 2015-01-12 VITALS — BP 182/95 | HR 95 | Temp 98.9°F

## 2015-01-12 DIAGNOSIS — Z5111 Encounter for antineoplastic chemotherapy: Secondary | ICD-10-CM

## 2015-01-12 DIAGNOSIS — C50412 Malignant neoplasm of upper-outer quadrant of left female breast: Secondary | ICD-10-CM | POA: Diagnosis not present

## 2015-01-12 MED ORDER — GOSERELIN ACETATE 3.6 MG ~~LOC~~ IMPL
3.6000 mg | DRUG_IMPLANT | Freq: Once | SUBCUTANEOUS | Status: AC
  Administered 2015-01-12: 3.6 mg via SUBCUTANEOUS
  Filled 2015-01-12: qty 3.6

## 2015-01-22 ENCOUNTER — Other Ambulatory Visit: Payer: Self-pay | Admitting: Adult Health

## 2015-01-22 ENCOUNTER — Other Ambulatory Visit: Payer: Self-pay | Admitting: Neurology

## 2015-01-22 ENCOUNTER — Other Ambulatory Visit: Payer: Self-pay | Admitting: Internal Medicine

## 2015-01-23 ENCOUNTER — Ambulatory Visit (INDEPENDENT_AMBULATORY_CARE_PROVIDER_SITE_OTHER): Payer: Medicare Other | Admitting: Internal Medicine

## 2015-01-23 ENCOUNTER — Encounter: Payer: Self-pay | Admitting: Internal Medicine

## 2015-01-23 ENCOUNTER — Other Ambulatory Visit: Payer: Self-pay | Admitting: *Deleted

## 2015-01-23 VITALS — BP 168/96 | HR 90 | Temp 98.5°F | Resp 16 | Wt 197.0 lb

## 2015-01-23 DIAGNOSIS — I10 Essential (primary) hypertension: Secondary | ICD-10-CM | POA: Diagnosis not present

## 2015-01-23 DIAGNOSIS — L309 Dermatitis, unspecified: Secondary | ICD-10-CM

## 2015-01-23 MED ORDER — HYOSCYAMINE SULFATE 0.125 MG SL SUBL: 0.1250 mg | SUBLINGUAL_TABLET | Freq: Three times a day (TID) | SUBLINGUAL | Status: DC | PRN

## 2015-01-23 MED ORDER — HYDROCHLOROTHIAZIDE 12.5 MG PO CAPS: 12.5000 mg | ORAL_CAPSULE | Freq: Every day | ORAL | Status: DC

## 2015-01-23 MED ORDER — PRIMIDONE 50 MG PO TABS: 25.0000 mg | ORAL_TABLET | Freq: Every day | ORAL | Status: DC

## 2015-01-23 MED ORDER — PREGABALIN 200 MG PO CAPS: 200.0000 mg | ORAL_CAPSULE | Freq: Three times a day (TID) | ORAL | Status: DC

## 2015-01-23 MED ORDER — TRIAMCINOLONE ACETONIDE 0.5 % EX CREA: 1.0000 "application " | TOPICAL_CREAM | Freq: Three times a day (TID) | CUTANEOUS | Status: DC

## 2015-01-23 NOTE — Progress Notes (Signed)
Subjective:  Patient ID: Erica [name], female    DOB: 17-Oct-1975  Age: 39 y.o. MRN: [id number]  CC: Rash and Hypertension   HPI Erica [name] presents for the complaint of a rash on her right hand for about 2 weeks. She has a few itchy bumps on the thenar eminence. She has not treated it with anything other than Neosporin. Neosporin is not helped. She also commence for blood pressure check. He has been having some headache and blurred vision and tells me she sees her neurologist later this week about her pseudotumor cerebri. She denies any chest pain or shortness of breath.  Outpatient Prescriptions Prior to Visit  Medication Sig Dispense Refill  . acetaZOLAMIDE (DIAMOX) 250 MG tablet Take 4 tablets (1,000 mg total) by mouth 3 (three) times daily. (Patient taking differently: Take 4 tablets by mouth four times a day) 400 tablet 5  . albuterol (PROVENTIL HFA;VENTOLIN HFA) 108 (90 BASE) MCG/ACT inhaler Inhale 2 puffs into the lungs every 4 (four) hours as needed for wheezing or shortness of breath (((PLAN A))). (Patient taking differently: Inhale 2 puffs into the lungs every 4 (four) hours as needed for wheezing or shortness of breath (((PLAN B))). ) 18 g 5  . albuterol (PROVENTIL) (2.5 MG/3ML) 0.083% nebulizer solution Take 3 mLs (2.5 mg total) by nebulization every 4 (four) hours as needed for wheezing or shortness of breath (((PLAN B))). (Patient taking differently: Take 2.5 mg by nebulization every 4 (four) hours as needed for wheezing or shortness of breath (((PLAN C))). ) 300 mL 5  . Biotin 10 MG TABS Take 1 tablet by mouth every morning.     . budesonide-formoterol (SYMBICORT) 160-4.5 MCG/ACT inhaler Take 2 puffs first thing in am and then another 2 puffs about 12 hours later. 1 Inhaler 5  . chlorpheniramine (CHLOR-TRIMETON) 4 MG tablet Take 8 mg by mouth at bedtime.    . cloNIDine (CATAPRES) 0.2 MG tablet Take 1 tablet (0.2 mg total) by mouth 2 (two) times daily. 60 tablet 11  .  dexlansoprazole (DEXILANT) 60 MG capsule Take 60 mg by mouth daily.    Marland Kitchen dextromethorphan-guaiFENesin (MUCINEX DM) 30-600 MG per 12 hr tablet Take 2 tablets by mouth 2 (two) times daily as needed (for cough, congestion, and thick mucus).     Marland Kitchen diltiazem (CARDIZEM CD) 240 MG 24 hr capsule Take 240 mg by mouth every morning.    Marland Kitchen EPINEPHrine 0.3 mg/0.3 mL IJ SOAJ injection Inject 0.3 mLs (0.3 mg total) into the muscle once. (Patient taking differently: Use as directed for allergic reaction) 2 Device 1  . furosemide (LASIX) 40 MG tablet Take 40 mg by mouth daily as needed for fluid.     . hydrALAZINE (APRESOLINE) 100 MG tablet Take 1 tablet (100 mg total) by mouth 3 (three) times daily. 90 tablet 11  . isosorbide mononitrate (IMDUR) 60 MG 24 hr tablet Take 1 tablet (60 mg total) by mouth daily. 30 tablet 11  . LORazepam (ATIVAN) 0.5 MG tablet Take 1 tablet (0.5 mg total) by mouth every 8 (eight) hours as needed. for anxiety 90 tablet 0  . metoCLOPramide (REGLAN) 10 MG tablet TAKE 1 TABLET BY MOUTH FOUR TIMES DAILY WITH MEALS AND AT BEDTIME 120 tablet 0  . metoCLOPramide (REGLAN) 10 MG tablet TAKE 1 TABLET BY MOUTH FOUR TIMES DAILY WITH MEALS AND AT BEDTIME 120 tablet 4  . mometasone (NASONEX) 50 MCG/ACT nasal spray Place 2 sprays into the nose 2 (two) times daily.    Marland Kitchen  montelukast (SINGULAIR) 10 MG tablet Take 1 tablet (10 mg total) by mouth at bedtime. 30 tablet 5  . Nebivolol HCl (BYSTOLIC) 20 MG TABS 1 tab by mouth per day (Patient taking differently: Take 20 mg by mouth every morning. ) 90 tablet 3  . nitroGLYCERIN (NITROSTAT) 0.4 MG SL tablet Place 1 tablet (0.4 mg total) under the tongue every 5 (five) minutes as needed for chest pain. 90 tablet 3  . ondansetron (ZOFRAN-ODT) 4 MG disintegrating tablet Take 1 tablet (4 mg total) by mouth every 6 (six) hours as needed for nausea or vomiting. (Patient taking differently: Take 4 mg by mouth every 6 (six) hours as needed for nausea. ) 20 tablet 5  .  potassium chloride SA (K-DUR,KLOR-CON) 20 MEQ tablet Take 20 mEq by mouth 3 (three) times daily.    . predniSONE (DELTASONE) 20 MG tablet 2 tabs po daily x 3 days 6 tablet 0  . pregabalin (LYRICA) 200 MG capsule Take 1 capsule (200 mg total) by mouth 3 (three) times daily. 90 capsule 5  . primidone (MYSOLINE) 50 MG tablet TAKE 1/2 TABLET(25 MG) BY MOUTH DAILY 90 tablet 3  . primidone (MYSOLINE) 50 MG tablet Take 0.5 tablets (25 mg total) by mouth at bedtime. 30 tablet 5  . spironolactone (ALDACTONE) 25 MG tablet Take 1 tablet by mouth every morning and 2 tablets by mouth at bedtime    . sucralfate (CARAFATE) 1 G tablet Take 1 g by mouth 4 (four) times daily.    . hyoscyamine (LEVSIN SL) 0.125 MG SL tablet Place 1 tablet (0.125 mg total) under the tongue every 8 (eight) hours as needed for cramping. (Patient taking differently: Place 0.125 mg under the tongue 3 (three) times daily. ) 30 tablet 2   No facility-administered medications prior to visit.    ROS Review of Systems  Constitutional: Negative.  Negative for fever, chills, diaphoresis, appetite change and fatigue.  HENT: Negative for sore throat, trouble swallowing and voice change.   Eyes: Positive for visual disturbance. Negative for photophobia and redness.  Respiratory: Negative.  Negative for cough, choking, chest tightness, shortness of breath and stridor.   Cardiovascular: Negative.  Negative for chest pain, palpitations and leg swelling.  Gastrointestinal: Negative.  Negative for nausea, vomiting, abdominal pain, diarrhea, constipation and blood in stool.  Endocrine: Negative.   Genitourinary: Negative.  Negative for urgency, hematuria, flank pain, decreased urine volume and difficulty urinating.  Musculoskeletal: Negative.  Negative for myalgias, back pain, joint swelling and arthralgias.  Skin: Positive for rash.  Allergic/Immunologic: Negative.   Neurological: Positive for headaches. Negative for dizziness, syncope, speech  difficulty, weakness, light-headedness and numbness.  Hematological: Negative.  Negative for adenopathy. Does not bruise/bleed easily.  Psychiatric/Behavioral: Negative.     Objective:  BP 168/96 mmHg  Pulse 90  Temp(Src) 98.5 F (36.9 C)  Resp 16  Wt 197 lb (89.359 kg)  SpO2 98%  BP Readings from Last 3 Encounters:  01/23/15 168/96  01/12/15 182/95  12/15/14 182/98    Wt Readings from Last 3 Encounters:  01/23/15 197 lb (89.359 kg)  12/08/14 195 lb 6.4 oz (88.633 kg)  12/05/14 192 lb (87.091 kg)    Physical Exam  Constitutional: She is oriented to person, place, and time. She appears well-developed and well-nourished. No distress.  HENT:  Head: Normocephalic and atraumatic.  Mouth/Throat: Oropharynx is clear and moist. No oropharyngeal exudate.  Eyes: Conjunctivae are normal. Right eye exhibits no discharge. Left eye exhibits no discharge. No scleral  icterus.  Neck: Normal range of motion. Neck supple. No JVD present. No tracheal deviation present. No thyromegaly present.  Cardiovascular: Normal rate, regular rhythm, normal heart sounds and intact distal pulses.  Exam reveals no gallop and no friction rub.   No murmur heard. Pulmonary/Chest: Effort normal and breath sounds normal. No stridor. No respiratory distress. She has no wheezes. She has no rales. She exhibits no tenderness.  Abdominal: Soft. Bowel sounds are normal. She exhibits no distension and no mass. There is no tenderness. There is no rebound and no guarding.  Musculoskeletal: Normal range of motion. She exhibits no edema or tenderness.  Lymphadenopathy:    She has no cervical adenopathy.  Neurological: She is oriented to person, place, and time.  Skin: Skin is warm. Rash noted. No abrasion, no bruising, no laceration, no petechiae and no purpura noted. Rash is papular. Rash is not macular, not maculopapular, not nodular, not pustular, not vesicular and not urticarial. She is not diaphoretic. No erythema. No  pallor.  There are 3, tiny, scaly papules on her right thenar eminence.  Vitals reviewed.   Lab Results  Component Value Date   WBC 8.0 09/22/2014   HGB 12.4 09/22/2014   HCT 36.3 09/22/2014   PLT 242 09/22/2014   GLUCOSE 94 12/01/2014   CHOL 172 02/03/2014   TRIG 151.0* 02/03/2014   HDL 32.00* 02/03/2014   LDLCALC 110* 02/03/2014   ALT 27 09/20/2014   AST 28 09/20/2014   NA 142 12/01/2014   K 3.5 12/01/2014   CL 112 12/01/2014   CREATININE 0.96 12/01/2014   BUN 11 12/01/2014   CO2 22 12/01/2014   TSH 0.570 09/20/2014   INR 1.1* 06/03/2012   HGBA1C 5.7 11/08/2014    US Transvaginal Non-ob  12/16/2014  CLINICAL DATA:  Heavy vaginal bleeding for months. The patient is on [facility] therapy. Previous bilateral tubal ligation. Question of small fibroid. Previous breast cancer. Premenopausal. LMP 11/24/2014. EXAM: TRANSABDOMINAL AND TRANSVAGINAL ULTRASOUND OF PELVIS TECHNIQUE: Both transabdominal and transvaginal ultrasound examinations of the pelvis were performed. Transabdominal technique was performed for global imaging of the pelvis including uterus, ovaries, adnexal regions, and pelvic cul-de-sac. It was necessary to proceed with endovaginal exam following the transabdominal exam to visualize the ovaries and adnexal regions. COMPARISON:  09/14/2015 FINDINGS: Uterus Measurements: 8.1 x 4.1 x 4.2 cm. No fibroids or other ass visualized. Endometrium Thickness: 1.0 cm.  No focal abnormality visualized. Right ovary Measurements: The ovary is not visualized, either absent or obscured . No adnexal mass identified. Left ovary Measurements: 3.5 x 1.9 x 2.6 cm. Normal appearance/no adnexal mass. Other findings No free fluid. IMPRESSION: 1. No focal uterine abnormality. 2. Smooth normal thickness endometrium. 3. If bleeding remains unresponsive to hormonal or medical therapy, sonohysterogram should be considered for focal lesion work-up. (Ref: Radiological Reasoning: Algorithmic Workup of Abnormal  Vaginal Bleeding with Endovaginal Sonography and Sonohysterography. AJR 2008; 595:G38-75) 4. Nonvisualized right ovary ; normal appearance of the left ovary. No adnexal masses identified. Electronically Signed   By: Nolon Nations M.D.   On: 12/16/2014 10:52   US Pelvis Complete  12/16/2014  CLINICAL DATA:  Heavy vaginal bleeding for months. The patient is on [facility] therapy. Previous bilateral tubal ligation. Question of small fibroid. Previous breast cancer. Premenopausal. LMP 11/24/2014. EXAM: TRANSABDOMINAL AND TRANSVAGINAL ULTRASOUND OF PELVIS TECHNIQUE: Both transabdominal and transvaginal ultrasound examinations of the pelvis were performed. Transabdominal technique was performed for global imaging of the pelvis including uterus, ovaries, adnexal regions, and pelvic cul-de-sac.  It was necessary to proceed with endovaginal exam following the transabdominal exam to visualize the ovaries and adnexal regions. COMPARISON:  09/14/2015 FINDINGS: Uterus Measurements: 8.1 x 4.1 x 4.2 cm. No fibroids or other ass visualized. Endometrium Thickness: 1.0 cm.  No focal abnormality visualized. Right ovary Measurements: The ovary is not visualized, either absent or obscured . No adnexal mass identified. Left ovary Measurements: 3.5 x 1.9 x 2.6 cm. Normal appearance/no adnexal mass. Other findings No free fluid. IMPRESSION: 1. No focal uterine abnormality. 2. Smooth normal thickness endometrium. 3. If bleeding remains unresponsive to hormonal or medical therapy, sonohysterogram should be considered for focal lesion work-up. (Ref: Radiological Reasoning: Algorithmic Workup of Abnormal Vaginal Bleeding with Endovaginal Sonography and Sonohysterography. AJR 2008; 725:D66-44) 4. Nonvisualized right ovary ; normal appearance of the left ovary. No adnexal masses identified. Electronically Signed   By: Nolon Nations M.D.   On: 12/16/2014 10:52    Assessment & Plan:   Shadia was seen today for rash and  hypertension.  Diagnoses and all orders for this visit:  Essential hypertension- her blood pressure is not well controlled and she is symptomatic. Will start hydrochlorothiazide once a day in addition to her other antihypertensives. -     hydrochlorothiazide (MICROZIDE) 12.5 MG capsule; Take 1 capsule (12.5 mg total) by mouth daily.  Eczema of right hand- will treat this with a low potency topical steroid cream. -     triamcinolone cream (KENALOG) 0.5 %; Apply 1 application topically 3 (three) times daily.   I am having Erica [name] start on hydrochlorothiazide and triamcinolone cream. I am also having her maintain her nitroGLYCERIN, montelukast, albuterol, EPINEPHrine, ondansetron, isosorbide mononitrate, Biotin, acetaZOLAMIDE, albuterol, sucralfate, furosemide, dextromethorphan-guaiFENesin, potassium chloride SA, chlorpheniramine, Nebivolol HCl, budesonide-formoterol, cloNIDine, hydrALAZINE, mometasone, diltiazem, spironolactone, dexlansoprazole, predniSONE, metoCLOPramide, LORazepam, primidone, metoCLOPramide, primidone, and pregabalin.  Meds ordered this encounter  Medications  . hydrochlorothiazide (MICROZIDE) 12.5 MG capsule    Sig: Take 1 capsule (12.5 mg total) by mouth daily.    Dispense:  90 capsule    Refill:  1  . triamcinolone cream (KENALOG) 0.5 %    Sig: Apply 1 application topically 3 (three) times daily.    Dispense:  30 g    Refill:  2     Follow-up: Return in about 6 weeks (around 03/06/2015).  Scarlette Calico, MD

## 2015-01-23 NOTE — Patient Instructions (Signed)
Hypertension Hypertension, commonly called high blood pressure, is when the force of blood pumping through your arteries is too strong. Your arteries are the blood vessels that carry blood from your heart throughout your body. A blood pressure reading consists of a higher number over a lower number, such as 110/72. The higher number (systolic) is the pressure inside your arteries when your heart pumps. The lower number (diastolic) is the pressure inside your arteries when your heart relaxes. Ideally you want your blood pressure below 120/80. Hypertension forces your heart to work harder to pump blood. Your arteries may become narrow or stiff. Having untreated or uncontrolled hypertension can cause heart attack, stroke, kidney disease, and other problems. RISK FACTORS Some risk factors for high blood pressure are controllable. Others are not.  Risk factors you cannot control include:   Race. You may be at higher risk if you are African American.  Age. Risk increases with age.  Gender. Men are at higher risk than women before age 45 years. After age 65, women are at higher risk than men. Risk factors you can control include:  Not getting enough exercise or physical activity.  Being overweight.  Getting too much fat, sugar, calories, or salt in your diet.  Drinking too much alcohol. SIGNS AND SYMPTOMS Hypertension does not usually cause signs or symptoms. Extremely high blood pressure (hypertensive crisis) may cause headache, anxiety, shortness of breath, and nosebleed. DIAGNOSIS To check if you have hypertension, your health care provider will measure your blood pressure while you are seated, with your arm held at the level of your heart. It should be measured at least twice using the same arm. Certain conditions can cause a difference in blood pressure between your right and left arms. A blood pressure reading that is higher than normal on one occasion does not mean that you need treatment. If  it is not clear whether you have high blood pressure, you may be asked to return on a different day to have your blood pressure checked again. Or, you may be asked to monitor your blood pressure at home for 1 or more weeks. TREATMENT Treating high blood pressure includes making lifestyle changes and possibly taking medicine. Living a healthy lifestyle can help lower high blood pressure. You may need to change some of your habits. Lifestyle changes may include:  Following the DASH diet. This diet is high in fruits, vegetables, and whole grains. It is low in salt, red meat, and added sugars.  Keep your sodium intake below 2,300 mg per day.  Getting at least 30-45 minutes of aerobic exercise at least 4 times per week.  Losing weight if necessary.  Not smoking.  Limiting alcoholic beverages.  Learning ways to reduce stress. Your health care provider may prescribe medicine if lifestyle changes are not enough to get your blood pressure under control, and if one of the following is true:  You are 18-59 years of age and your systolic blood pressure is above 140.  You are 60 years of age or older, and your systolic blood pressure is above 150.  Your diastolic blood pressure is above 90.  You have diabetes, and your systolic blood pressure is over 140 or your diastolic blood pressure is over 90.  You have kidney disease and your blood pressure is above 140/90.  You have heart disease and your blood pressure is above 140/90. Your personal target blood pressure may vary depending on your medical conditions, your age, and other factors. HOME CARE INSTRUCTIONS    Have your blood pressure rechecked as directed by your health care provider.   Take medicines only as directed by your health care provider. Follow the directions carefully. Blood pressure medicines must be taken as prescribed. The medicine does not work as well when you skip doses. Skipping doses also puts you at risk for  problems.  Do not smoke.   Monitor your blood pressure at home as directed by your health care provider. SEEK MEDICAL CARE IF:   You think you are having a reaction to medicines taken.  You have recurrent headaches or feel dizzy.  You have swelling in your ankles.  You have trouble with your vision. SEEK IMMEDIATE MEDICAL CARE IF:  You develop a severe headache or confusion.  You have unusual weakness, numbness, or feel faint.  You have severe chest or abdominal pain.  You vomit repeatedly.  You have trouble breathing. MAKE SURE YOU:   Understand these instructions.  Will watch your condition.  Will get help right away if you are not doing well or get worse.   This information is not intended to replace advice given to you by your health care provider. Make sure you discuss any questions you have with your health care provider.   Document Released: 03/18/2005 Document Revised: 08/02/2014 Document Reviewed: 01/08/2013 Elsevier Interactive Patient Education 2016 [employer]  

## 2015-01-23 NOTE — Telephone Encounter (Signed)
Rx sent 

## 2015-01-23 NOTE — Progress Notes (Signed)
Pre visit review using our clinic review tool, if applicable. No additional management support is needed unless otherwise documented below in the visit note. 

## 2015-01-26 ENCOUNTER — Encounter: Payer: Self-pay | Admitting: Neurology

## 2015-01-26 ENCOUNTER — Ambulatory Visit (INDEPENDENT_AMBULATORY_CARE_PROVIDER_SITE_OTHER): Payer: Medicare Other | Admitting: Neurology

## 2015-01-26 VITALS — BP 170/100 | HR 102 | Wt 189.4 lb

## 2015-01-26 DIAGNOSIS — G932 Benign intracranial hypertension: Secondary | ICD-10-CM | POA: Diagnosis not present

## 2015-01-26 DIAGNOSIS — G620 Drug-induced polyneuropathy: Secondary | ICD-10-CM

## 2015-01-26 DIAGNOSIS — T451X5A Adverse effect of antineoplastic and immunosuppressive drugs, initial encounter: Secondary | ICD-10-CM

## 2015-01-26 NOTE — Patient Instructions (Addendum)
1.  We will schedule you for high volume lumbar puncture 2.  Referral to [facility] 3.  Return to clinic in 3 months

## 2015-01-26 NOTE — Progress Notes (Signed)
Follow-up Visit   Date: 01/26/2015    ERCIA CRISAFULLI MRN: [id number] DOB: [birth year]   Interim History: Erica [name] is a 39 y.o. 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    History of present illness: Starting in 2010 after her second course of chemotherapy she developed numbness/tingling of the arm and legs, described as her hands are asleep. She has burning sensation and shaking of her hands. Activity worsens pain and she has difficulty with fine motor tasks such as buttoning, tying shoe laces, and braiding her daughter's hair. She falls about once per month because her legs give out. She was started on Lyrica 50mg  TID around March which has helped slightly.   Of note, she history of pseudotumor cerebri, followed by ophthalmology, and currently takes diamox 500mg  BID. 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 She had lumbar puncture performed in the seated position with documented OP of 43 and 32cc of fluid removed. She had a second lumbar puncture in 2014 at which time OP was 18 mmHg.   - Follow-up 01/18/2014:  She had one interval emergency room visit due to severe headache and had LP which showed OP of 26.  Since then, headache has been under control.  Her leg pain continues to be problematic with no lasting benefit despite increase Lyrica to 200mg  BID.    - UPDATE 03/03/2014:  Doing better after increasing Lyrica to 200mg  TID and lidocaine ointment.  She reports less shaking of her hand and reports greater than 50% improvement of pain with lidocaine.  She reports having new spells of  syncope, which seems to only occur when she is sitting. There is loss of consciousness, lasting only a few seconds.  Following the spells, there is no fatigue, confusion, or weakness. On one ocassion, she felt herself leaning to the side and caught herself when at therapy. She is unaware of the spells.   - UPDATE 03/20/2015:  She comes today with complains of new eye drainage, right eye lid swelling, and "whoosing" sound in her ear which started today.   She is taking Lyrica 200mg  TID and lidocaine ointment which helps.  She continues to have syncopal spells, recent sleep study showed OSA and she justed CPAP last night.    - UPDATE 08/19/2014:  I received a call from her ophthalmologist, Dr. Vista Mink with new findings of severe left temporal visual field deficit involving her left eye (good eye).  My office set up urgent CT/A which returned normal and arranged for MRI brain to be done the following week. Patient cancelled her MRI scheduled for 5/9 and rescheduled it to 5/25.  She underwent LP with OP of 27cm of water.  Since her LP, she reports feeling as if her vision is better and eyes don't feel as foggy anymore.   Denies headaches, worsening of vision, or weakness.  Her potassium levels have been persistently low despite taking supplements and she was recently started on spironolactone.   - UPDATE 10/21/2014:  She states that her peripheral vision on the left has somewhat improved.  Headaches are better.  She was evaluated by neurosurgery for shunt evaluation, but because of no papilledema, did not feel that she would be  a good candidate unless symptoms changes or worsened.  Unfortunately, she had an interval hospitalization for acute mental status change last month and was found to have AKI, etiology unclear.  - UPDATE 01/26/2015:  She feels that her CSF pressure is elevated because she has right facial pressure and has to focus with her vision more. Denies double vision. She has an eye evaluation next  month. Her blood pressure has remained elevated and she was started on a new antihypertensive medication.  No new complaints.   Medications:  Current Outpatient Prescriptions on File Prior to Visit  Medication Sig Dispense Refill  . acetaZOLAMIDE (DIAMOX) 250 MG tablet Take 4 tablets (1,000 mg total) by mouth 3 (three) times daily. (Patient taking differently: Take 4 tablets by mouth four times a day) 400 tablet 5  . albuterol (PROVENTIL HFA;VENTOLIN HFA) 108 (90 BASE) MCG/ACT inhaler Inhale 2 puffs into the lungs every 4 (four) hours as needed for wheezing or shortness of breath (((PLAN A))). (Patient taking differently: Inhale 2 puffs into the lungs every 4 (four) hours as needed for wheezing or shortness of breath (((PLAN B))). ) 18 g 5  . albuterol (PROVENTIL) (2.5 MG/3ML) 0.083% nebulizer solution Take 3 mLs (2.5 mg total) by nebulization every 4 (four) hours as needed for wheezing or shortness of breath (((PLAN B))). (Patient taking differently: Take 2.5 mg by nebulization every 4 (four) hours as needed for wheezing or shortness of breath (((PLAN C))). ) 300 mL 5  . Biotin 10 MG TABS Take 1 tablet by mouth every morning.     . chlorpheniramine (CHLOR-TRIMETON) 4 MG tablet Take 8 mg by mouth at bedtime.    . cloNIDine (CATAPRES) 0.2 MG tablet Take 1 tablet (0.2 mg total) by mouth 2 (two) times daily. 60 tablet 11  . dexlansoprazole (DEXILANT) 60 MG capsule Take 60 mg by mouth daily.    Marland Kitchen dextromethorphan-guaiFENesin (MUCINEX DM) 30-600 MG per 12 hr tablet Take 2 tablets by mouth 2 (two) times daily as needed (for cough, congestion, and thick mucus).     Marland Kitchen diltiazem (CARDIZEM CD) 240 MG 24 hr capsule Take 240 mg by mouth every morning.    Marland Kitchen EPINEPHrine 0.3 mg/0.3 mL IJ SOAJ injection Inject 0.3 mLs (0.3 mg total) into the muscle once. (Patient taking differently: Use as directed for allergic reaction) 2 Device 1  . furosemide (LASIX) 40 MG tablet Take 40 mg by mouth daily as needed for fluid.       . hydrALAZINE (APRESOLINE) 100 MG tablet Take 1 tablet (100 mg total) by mouth 3 (three) times daily. 90 tablet 11  . hydrochlorothiazide (MICROZIDE) 12.5 MG capsule Take 1 capsule (12.5 mg total) by mouth daily. 90 capsule 1  . hyoscyamine (LEVSIN SL) 0.125 MG SL tablet Place 1 tablet (0.125 mg total) under the tongue every 8 (eight) hours as needed for cramping. 30 tablet 2  . isosorbide mononitrate (IMDUR) 60 MG 24 hr tablet Take 1 tablet (60 mg total) by mouth daily. 30 tablet 11  . LORazepam (ATIVAN) 0.5 MG tablet Take 1 tablet (0.5 mg total) by mouth every 8 (eight) hours as needed. for anxiety 90 tablet 0  . metoCLOPramide (REGLAN) 10 MG tablet TAKE 1 TABLET BY MOUTH FOUR TIMES DAILY WITH MEALS AND AT BEDTIME 120 tablet 0  . mometasone (NASONEX) 50 MCG/ACT nasal spray Place 2 sprays into the nose 2 (two) times daily.    . montelukast (SINGULAIR) 10 MG tablet Take 1 tablet (10 mg total) by  mouth at bedtime. 30 tablet 5  . Nebivolol HCl (BYSTOLIC) 20 MG TABS 1 tab by mouth per day (Patient taking differently: Take 20 mg by mouth every morning. ) 90 tablet 3  . nitroGLYCERIN (NITROSTAT) 0.4 MG SL tablet Place 1 tablet (0.4 mg total) under the tongue every 5 (five) minutes as needed for chest pain. 90 tablet 3  . ondansetron (ZOFRAN-ODT) 4 MG disintegrating tablet Take 1 tablet (4 mg total) by mouth every 6 (six) hours as needed for nausea or vomiting. (Patient taking differently: Take 4 mg by mouth every 6 (six) hours as needed for nausea. ) 20 tablet 5  . potassium chloride SA (K-DUR,KLOR-CON) 20 MEQ tablet Take 20 mEq by mouth 3 (three) times daily.    . pregabalin (LYRICA) 200 MG capsule Take 1 capsule (200 mg total) by mouth 3 (three) times daily. 90 capsule 5  . primidone (MYSOLINE) 50 MG tablet TAKE 1/2 TABLET(25 MG) BY MOUTH DAILY 90 tablet 3  . primidone (MYSOLINE) 50 MG tablet Take 0.5 tablets (25 mg total) by mouth at bedtime. 30 tablet 5  . spironolactone (ALDACTONE) 25 MG tablet Take  1 tablet by mouth every morning and 2 tablets by mouth at bedtime    . sucralfate (CARAFATE) 1 G tablet Take 1 g by mouth 4 (four) times daily.    . SYMBICORT 160-4.5 MCG/ACT inhaler INHALE 2 PUFFS BY MOUTH TWICE DAILY 10.2 g 5  . triamcinolone cream (KENALOG) 0.5 % Apply 1 application topically 3 (three) times daily. 30 g 2   No current facility-administered medications on file prior to visit.    Allergies:  Allergies  Allergen Reactions  . Aspirin Shortness Of Breath and Palpitations    Pt can take ibuprofen without reaction  . Doxycycline Anaphylaxis  . Penicillins Shortness Of Breath and Palpitations  . Kiwi Extract Itching and Swelling    Lip swelling  . Strawberry Extract Hives     Review of Systems:  CONSTITUTIONAL: No fevers, chills, night sweats, or weight loss.   EYES: No visual changes or eye pain ENT: No hearing changes.  No history of nose bleeds.   RESPIRATORY: No cough, wheezing and shortness of breath.   CARDIOVASCULAR: Negative for chest pain, and palpitations.   GI: Negative for abdominal discomfort, blood in stools or black stools.  No recent change in bowel habits.   GU:  No history of incontinence.   MUSCLOSKELETAL: No history of joint pain or swelling.  No myalgias.   SKIN: Negative for lesions, rash, and itching.   ENDOCRINE: Negative for cold or heat intolerance, polydipsia or goiter.   PSYCH:  No depression or anxiety symptoms.   NEURO: As Above.   Vital Signs:  BP 170/100 mmHg  Pulse 102  Wt 189 lb 6 oz (85.9 kg)  SpO2 98%   Neurological Exam: MENTAL STATUS including orientation to time, place, person is normal.  Speech is not dysarthric.  CRANIAL NERVES:  No papilledema on fundoscopic exam, disc are sharp bilaterally with venous pulsations, disc is pale on the right (old).  She is blind in the right eye - only able to see light.  Visual field testing of the left eye is shows reduced peripheral vision on the left temporal field.  Pupils equal  round and reactive to light.  Normal conjugate, extra-ocular eye movements in all directions of gaze.  Face is symmetric. Palate elevates symmetrically.   MOTOR:  Motor strength is 5/5 in all extremities. Left hand with intention tremor  when hands outstretched.  MSRs:  Reflexes are 2+/4 throughout.  SENSORY:  Intact to vibration and temperature throughout  COORDINATION/GAIT:   Gait narrow based and stable.  There is in-toeing bilaterally.   Data: [facility], Dr. 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 CT/A head and neck 07/26/2014:  Unremarkable  Lumbar puncture with OP 07/29/2014: Elevated opening pressure it 27 cm of water which was decreased to 16 cm of water.  CSF 09/18/2011: R269 W0 G65 P17, CSF cytology-neg OP 43, 32cc removed   LP 02/05/2013: OP 18, CP 17 after 21 cc removed  LP 10/25/2013:  OP 26, CP 5 after 15 cc removed  Labs 09/17/2011: vitamin B12 725, RPR NR   CT head 02/05/2013: No acute abnormality   MRI brain 08/13/2010:  1. No evidence for metastatic disease to the brain.  2. Scattered subcortical T2 and FLAIR hyperintensities are slightly greater than expected for age. The finding is nonspecific  but can be seen in the setting of chronic microvascular ischemia, a demyelinating process such as multiple sclerosis, vasculitis, complicated migraine headaches, or as the sequelae of a prior infectious or inflammatory process. The lesions could be related to prior brain radiation if this has been performed.   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  Although she has history of serial lumbar punctures over the years, with elevated CSF pressure, there is no papilledema  exam.  Therefore other causes of optic neuropathy such as hypertension also needs to be considered. She is not a candidate for VP shunt at this time and was seen by Dr. Kathyrn Sheriff Continue diamox to 1000mg  TID, she is also taking lasix 40mg  and potassium supplements.  With her worsening headache, proceed with high volume LP with CSF cell count, protein, and glucose.  She seems to have about 1-2 serial LPs annually. 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  Clinically stable. Symptoms manifesting with painful parsthesias of her hands and feet which started following chemotherapy with docetaxel/cyclophosphamide (2010).  Continue Lyrica to 200mg  TID and lidocaine ointment to her feet.    Benign essential tremors, monitor for now.   No betablocks given history of asthma  Syncopal spells These episodes are very brief with quick return back to baseline.   Cardiac work-up is negative, EEG is negative Patient informed of [place] law of no driving for 32-months since last spell of impaired consciousness  Resistant hypertension, asymptomatic BP medications adjusted yesterday  Encouraged to monitor BP at home  Return to clinic in 3 months   The duration of this appointment visit was 25 minutes of face-to-face time with the patient.  Greater than 50% of this time was spent in counseling, explanation of diagnosis, planning of further management, and coordination of care.   Thank you for allowing me to participate in patient's care.  If I can answer any additional questions, I would be pleased to do so.    Sincerely,    Donika K. Posey Pronto, DO

## 2015-01-31 ENCOUNTER — Encounter: Payer: Self-pay | Admitting: Neurology

## 2015-01-31 ENCOUNTER — Other Ambulatory Visit: Payer: Self-pay | Admitting: *Deleted

## 2015-01-31 DIAGNOSIS — R6889 Other general symptoms and signs: Secondary | ICD-10-CM

## 2015-01-31 DIAGNOSIS — H00013 Hordeolum externum right eye, unspecified eyelid: Secondary | ICD-10-CM

## 2015-01-31 DIAGNOSIS — H5340 Unspecified visual field defects: Secondary | ICD-10-CM

## 2015-01-31 DIAGNOSIS — R55 Syncope and collapse: Secondary | ICD-10-CM

## 2015-01-31 DIAGNOSIS — IMO0001 Reserved for inherently not codable concepts without codable children: Secondary | ICD-10-CM

## 2015-01-31 DIAGNOSIS — G250 Essential tremor: Secondary | ICD-10-CM

## 2015-01-31 DIAGNOSIS — G932 Benign intracranial hypertension: Secondary | ICD-10-CM

## 2015-01-31 DIAGNOSIS — G620 Drug-induced polyneuropathy: Secondary | ICD-10-CM

## 2015-01-31 DIAGNOSIS — T451X5A Adverse effect of antineoplastic and immunosuppressive drugs, initial encounter: Secondary | ICD-10-CM

## 2015-02-05 ENCOUNTER — Encounter (HOSPITAL_COMMUNITY): Payer: Self-pay | Admitting: *Deleted

## 2015-02-05 ENCOUNTER — Emergency Department (HOSPITAL_COMMUNITY): Payer: Medicare Other

## 2015-02-05 ENCOUNTER — Emergency Department (HOSPITAL_COMMUNITY)
Admission: EM | Admit: 2015-02-05 | Discharge: 2015-02-05 | Disposition: A | Discharge status: Home / Self Care | Payer: Medicare Other | Attending: Physician Assistant | Admitting: Physician Assistant

## 2015-02-05 DIAGNOSIS — Z7951 Long term (current) use of inhaled steroids: Secondary | ICD-10-CM | POA: Diagnosis not present

## 2015-02-05 DIAGNOSIS — Z9981 Dependence on supplemental oxygen: Secondary | ICD-10-CM | POA: Insufficient documentation

## 2015-02-05 DIAGNOSIS — W01198A Fall on same level from slipping, tripping and stumbling with subsequent striking against other object, initial encounter: Secondary | ICD-10-CM | POA: Diagnosis not present

## 2015-02-05 DIAGNOSIS — Z79899 Other long term (current) drug therapy: Secondary | ICD-10-CM | POA: Diagnosis not present

## 2015-02-05 DIAGNOSIS — I10 Essential (primary) hypertension: Secondary | ICD-10-CM | POA: Diagnosis not present

## 2015-02-05 DIAGNOSIS — Y9289 Other specified places as the place of occurrence of the external cause: Secondary | ICD-10-CM | POA: Insufficient documentation

## 2015-02-05 DIAGNOSIS — Z853 Personal history of malignant neoplasm of breast: Secondary | ICD-10-CM | POA: Insufficient documentation

## 2015-02-05 DIAGNOSIS — Z862 Personal history of diseases of the blood and blood-forming organs and certain disorders involving the immune mechanism: Secondary | ICD-10-CM | POA: Insufficient documentation

## 2015-02-05 DIAGNOSIS — Y9389 Activity, other specified: Secondary | ICD-10-CM | POA: Diagnosis not present

## 2015-02-05 DIAGNOSIS — S60221A Contusion of right hand, initial encounter: Secondary | ICD-10-CM | POA: Insufficient documentation

## 2015-02-05 DIAGNOSIS — Y998 Other external cause status: Secondary | ICD-10-CM | POA: Insufficient documentation

## 2015-02-05 DIAGNOSIS — G4733 Obstructive sleep apnea (adult) (pediatric): Secondary | ICD-10-CM | POA: Insufficient documentation

## 2015-02-05 DIAGNOSIS — S6991XA Unspecified injury of right wrist, hand and finger(s), initial encounter: Secondary | ICD-10-CM | POA: Diagnosis present

## 2015-02-05 DIAGNOSIS — J45909 Unspecified asthma, uncomplicated: Secondary | ICD-10-CM | POA: Diagnosis not present

## 2015-02-05 DIAGNOSIS — Z880 Allergy status to penicillin: Secondary | ICD-10-CM | POA: Insufficient documentation

## 2015-02-05 DIAGNOSIS — M79641 Pain in right hand: Secondary | ICD-10-CM | POA: Diagnosis not present

## 2015-02-05 DIAGNOSIS — K219 Gastro-esophageal reflux disease without esophagitis: Secondary | ICD-10-CM | POA: Diagnosis not present

## 2015-02-05 DIAGNOSIS — Z7952 Long term (current) use of systemic steroids: Secondary | ICD-10-CM | POA: Insufficient documentation

## 2015-02-05 MED ORDER — IBUPROFEN 800 MG PO TABS
800.0000 mg | ORAL_TABLET | Freq: Once | ORAL | Status: AC
  Administered 2015-02-05: 800 mg via ORAL
  Filled 2015-02-05: qty 1

## 2015-02-05 MED ORDER — IBUPROFEN 800 MG PO TABS: 800.0000 mg | ORAL_TABLET | Freq: Three times a day (TID) | ORAL | Status: DC

## 2015-02-05 NOTE — ED Provider Notes (Signed)
CSN: [id number]     Arrival date & time 02/05/15  2235 History  By signing my name below, I, Erica [name], attest that this documentation has been prepared under the direction and in the presence of Charlann Lange, PA-C Electronically Signed: Soijett [name], ED Scribe. 02/05/2015. 11:05 PM.   Chief Complaint  Patient presents with  . Hand Injury      The history is provided by the patient. No language interpreter was used.    Erica [name] is a 39 y.o. female with a medical hx of HTN who presents to the Emergency Department complaining of moderate right hand injury onset 1 hour ago PTA. She notes that she tripped and fell she hit her right hand on a door and landed on her right hand with pain to the area. Pt is having associated symptoms of mild left hand pain and mild right hand swelling. She notes that she has not tried any medications for the relief of her symptoms. She denies color change, wound, rash, right arm pain, and any other symptoms.    Past Medical History  Diagnosis Date  . Asthma   . Hypertension   . GERD (gastroesophageal reflux disease)   . Impaired glucose tolerance 04/13/2011  . Anemia, unspecified 04/13/2011  . Asthma 09/27/2010  . Bloating 03/18/2011  . 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[place]) 09/27/2010  . Lymphadenitis, chronic     restricted LEFT extremity  . Dyspnea on exertion     a. 01/2013 Lexi MV: EF 54%, no ischemia/infarct;  b. 05/2013 Echo: EF 60-65%, no rwma, Gr 2 DD;  b.   . OSA (obstructive sleep apnea) 05/04/2014    AHI 31/hr now on CPAP at 12cm H2O  . Breast cancer ([place]) 2010    a. locally advanced left breast carcinoma diagnosed in 2010 and treated with neoadjuvant chemotherapy with docetaxel and cyclophosphamide as well as paclitaxel. This was followed by radiation therapy which was completed in 2011.  . Fatty liver   . Gastroparesis    Past Surgical History  Procedure Laterality Date  . Tendon release Right 2012    "hand"  . Breast lumpectomy Left 09/2008   . Carpal tunnel release Left 2011  . Tubal ligation  05/11/2011    Procedure: POST PARTUM TUBAL LIGATION;  Surgeon: Emeterio Reeve, MD;  Location: [facility];  Service: Gynecology;  Laterality: Bilateral;  Induced for HTN  . Lymph node dissection Left 2010    breast; 2 wks after breast lumpectomy   Family History  Problem Relation Age of Onset  . Diabetes Maternal Grandmother   . Heart disease Maternal Grandmother   . Hypertension Maternal Grandmother   . Cancer Neg Hx   . Alcohol abuse Neg Hx   . Early death Neg Hx   . Hyperlipidemia Neg Hx   . Kidney disease Neg Hx   . Stroke Neg Hx   . Colon cancer Neg Hx    Social History  Substance Use Topics  . Smoking status: Never Smoker   . Smokeless tobacco: Never Used  . Alcohol Use: 0.0 oz/week    0 Standard drinks or equivalent per week     Comment: occasionally   OB History    Gravida Para Term Preterm AB TAB SAB Ectopic Multiple Living   5 4 4  1  1   4      Review of Systems  Musculoskeletal: Positive for joint swelling and arthralgias. Negative for gait problem.  Skin: Negative for color change, rash and  wound.      Allergies  Aspirin; Doxycycline; Penicillins; Kiwi extract; and Strawberry extract  Home Medications   Prior to Admission medications   Medication Sig Start Date End Date Taking? Authorizing Provider  acetaZOLAMIDE (DIAMOX) 250 MG tablet Take 4 tablets (1,000 mg total) by mouth 3 (three) times daily. Patient taking differently: Take 4 tablets by mouth four times a day 08/19/14   Donika K Patel, DO  albuterol (PROVENTIL HFA;VENTOLIN HFA) 108 (90 BASE) MCG/ACT inhaler Inhale 2 puffs into the lungs every 4 (four) hours as needed for wheezing or shortness of breath (((PLAN A))). Patient taking differently: Inhale 2 puffs into the lungs every 4 (four) hours as needed for wheezing or shortness of breath (((PLAN B))).  08/30/14   Tammy S Parrett, NP  albuterol (PROVENTIL) (2.5 MG/3ML) 0.083% nebulizer solution Take 3 mLs  (2.5 mg total) by nebulization every 4 (four) hours as needed for wheezing or shortness of breath (((PLAN B))). Patient taking differently: Take 2.5 mg by nebulization every 4 (four) hours as needed for wheezing or shortness of breath (((PLAN C))).  01/26/14   Tammy S Parrett, NP  Biotin 10 MG TABS Take 1 tablet by mouth every morning.     Historical Provider, MD  chlorpheniramine (CHLOR-TRIMETON) 4 MG tablet Take 8 mg by mouth at bedtime.    Historical Provider, MD  cloNIDine (CATAPRES) 0.2 MG tablet Take 1 tablet (0.2 mg total) by mouth 2 (two) times daily. 11/08/14   Biagio Borg, MD  dexlansoprazole (DEXILANT) 60 MG capsule Take 60 mg by mouth daily.    Historical Provider, MD  dextromethorphan-guaiFENesin (MUCINEX DM) 30-600 MG per 12 hr tablet Take 2 tablets by mouth 2 (two) times daily as needed (for cough, congestion, and thick mucus).     Historical Provider, MD  diltiazem (CARDIZEM CD) 240 MG 24 hr capsule Take 240 mg by mouth every morning.    Historical Provider, MD  EPINEPHrine 0.3 mg/0.3 mL IJ SOAJ injection Inject 0.3 mLs (0.3 mg total) into the muscle once. Patient taking differently: Use as directed for allergic reaction 01/28/14   [name], MD  furosemide (LASIX) 40 MG tablet Take 40 mg by mouth daily as needed for fluid.     Historical Provider, MD  hydrALAZINE (APRESOLINE) 100 MG tablet Take 1 tablet (100 mg total) by mouth 3 (three) times daily. 11/08/14   Biagio Borg, MD  hydrochlorothiazide (MICROZIDE) 12.5 MG capsule Take 1 capsule (12.5 mg total) by mouth daily. 01/23/15   [name], MD  hyoscyamine (LEVSIN SL) 0.125 MG SL tablet Place 1 tablet (0.125 mg total) under the tongue every 8 (eight) hours as needed for cramping. 01/23/15   Jerene Bears, MD  isosorbide mononitrate (IMDUR) 60 MG 24 hr tablet Take 1 tablet (60 mg total) by mouth daily. 02/08/14   [name], MD  LORazepam (ATIVAN) 0.5 MG tablet Take 1 tablet (0.5 mg total) by mouth every 8 (eight) hours  as needed. for anxiety 01/04/15   Chauncey Cruel, MD  metoCLOPramide (REGLAN) 10 MG tablet TAKE 1 TABLET BY MOUTH FOUR TIMES DAILY WITH MEALS AND AT BEDTIME 12/26/14   Jerene Bears, MD  mometasone (NASONEX) 50 MCG/ACT nasal spray Place 2 sprays into the nose 2 (two) times daily.    Historical Provider, MD  montelukast (SINGULAIR) 10 MG tablet Take 1 tablet (10 mg total) by mouth at bedtime. 01/26/14   Tammy S Parrett, NP  Nebivolol HCl (BYSTOLIC) 20  MG TABS 1 tab by mouth per day Patient taking differently: Take 20 mg by mouth every morning.  10/20/14   Biagio Borg, MD  nitroGLYCERIN (NITROSTAT) 0.4 MG SL tablet Place 1 tablet (0.4 mg total) under the tongue every 5 (five) minutes as needed for chest pain. 08/24/13   [name], MD  ondansetron (ZOFRAN-ODT) 4 MG disintegrating tablet Take 1 tablet (4 mg total) by mouth every 6 (six) hours as needed for nausea or vomiting. Patient taking differently: Take 4 mg by mouth every 6 (six) hours as needed for nausea.  01/28/14   [name], MD  potassium chloride SA (K-DUR,KLOR-CON) 20 MEQ tablet Take 20 mEq by mouth 3 (three) times daily.    Historical Provider, MD  pregabalin (LYRICA) 200 MG capsule Take 1 capsule (200 mg total) by mouth 3 (three) times daily. 01/23/15   Donika Keith Rake, DO  primidone (MYSOLINE) 50 MG tablet TAKE 1/2 TABLET(25 MG) BY MOUTH DAILY 01/23/15   Donika K Patel, DO  primidone (MYSOLINE) 50 MG tablet Take 0.5 tablets (25 mg total) by mouth at bedtime. 01/23/15   Donika Keith Rake, DO  spironolactone (ALDACTONE) 25 MG tablet Take 1 tablet by mouth every morning and 2 tablets by mouth at bedtime    Historical Provider, MD  sucralfate (CARAFATE) 1 G tablet Take 1 g by mouth 4 (four) times daily.    Historical Provider, MD  SYMBICORT 160-4.5 MCG/ACT inhaler INHALE 2 PUFFS BY MOUTH TWICE DAILY 01/23/15   Tammy S Parrett, NP  triamcinolone cream (KENALOG) 0.5 % Apply 1 application topically 3 (three) times daily. 01/23/15    [name], MD   BP 211/107 mmHg  Pulse 101  Temp(Src) 98.8 F (37.1 C) (Oral)  Resp 20  SpO2 97% Physical Exam  Constitutional: She is oriented to person, place, and time. She appears well-developed and well-nourished. No distress.  HENT:  Head: Normocephalic and atraumatic.  Eyes: EOM are normal.  Neck: Neck supple.  Cardiovascular: Normal rate.   Pulmonary/Chest: Effort normal. No respiratory distress.  Musculoskeletal:       Right hand: She exhibits decreased range of motion (due to pain) and swelling. She exhibits no deformity.  Right hand: mild swelling of the right dorsal hand without bony deformity or discoloration. ROM limited by pain. NVI.  Neurological: She is alert and oriented to person, place, and time.  Skin: Skin is warm and dry.  Psychiatric: She has a normal mood and affect. Her behavior is normal.  Nursing note and vitals reviewed.   ED Course  Procedures (including critical care time) DIAGNOSTIC STUDIES: Oxygen Saturation is 97% on RA, nl by my interpretation.    COORDINATION OF CARE: 10:58 PM Discussed treatment plan with pt at bedside which includes right hand xray and pt agreed to plan.    Labs Review Labs Reviewed - No data to display  Imaging Review No results found. I have personally reviewed and evaluated these images as part of my medical decision-making.   EKG Interpretation None      MDM   Final diagnoses:  None    1. Contusion hand, right  No bony fracture injury on imaging. No tendon deficits to suspect rupture or injury. Will treat as contusion with ibuprofen, PCP follow up prn.  I personally performed the services described in this documentation, which was scribed in my presence. The recorded information has been reviewed and is accurate.      Charlann Lange, PA-C 02/09/15 2021  Courteney Julio Alm, MD 02/11/15 0700

## 2015-02-05 NOTE — ED Notes (Signed)
Pt states that she tripped and fell and landed on her right hand; pt c/o rt hand pain; pt with tenderness to left hand and to 3rd and 4th finger

## 2015-02-05 NOTE — Discharge Instructions (Signed)
Cryotherapy °Cryotherapy means treatment with cold. Ice or gel packs can be used to reduce both pain and swelling. Ice is the most helpful within the first 24 to 48 hours after an injury or flare-up from overusing a muscle or joint. Sprains, strains, spasms, burning pain, shooting pain, and aches can all be eased with ice. Ice can also be used when recovering from surgery. Ice is effective, has very few side effects, and is safe for most people to use. °PRECAUTIONS  °Ice is not a safe treatment option for people with: °· Raynaud phenomenon. This is a condition affecting small blood vessels in the extremities. Exposure to cold may cause your problems to return. °· Cold hypersensitivity. There are many forms of cold hypersensitivity, including: °· Cold urticaria. Red, itchy hives appear on the skin when the tissues begin to warm after being iced. °· Cold erythema. This is a red, itchy rash caused by exposure to cold. °· Cold hemoglobinuria. Red blood cells break down when the tissues begin to warm after being iced. The hemoglobin that carry oxygen are passed into the urine because they cannot combine with blood proteins fast enough. °· Numbness or altered sensitivity in the area being iced. °If you have any of the following conditions, do not use ice until you have discussed cryotherapy with your caregiver: °· Heart conditions, such as arrhythmia, angina, or chronic heart disease. °· High blood pressure. °· Healing wounds or open skin in the area being iced. °· Current infections. °· Rheumatoid arthritis. °· Poor circulation. °· Diabetes. °Ice slows the blood flow in the region it is applied. This is beneficial when trying to stop inflamed tissues from spreading irritating chemicals to surrounding tissues. However, if you expose your skin to cold temperatures for too long or without the proper protection, you can damage your skin or nerves. Watch for signs of skin damage due to cold. °HOME CARE INSTRUCTIONS °Follow  these tips to use ice and cold packs safely. °· Place a dry or damp towel between the ice and skin. A damp towel will cool the skin more quickly, so you may need to shorten the time that the ice is used. °· For a more rapid response, add gentle compression to the ice. °· Ice for no more than 10 to 20 minutes at a time. The bonier the area you are icing, the less time it will take to get the benefits of ice. °· Check your skin after 5 minutes to make sure there are no signs of a poor response to cold or skin damage. °· Rest 20 minutes or more between uses. °· Once your skin is numb, you can end your treatment. You can test numbness by very lightly touching your skin. The touch should be so light that you do not see the skin dimple from the pressure of your fingertip. When using ice, most people will feel these normal sensations in this order: cold, burning, aching, and numbness. °· Do not use ice on someone who cannot communicate their responses to pain, such as small children or people with dementia. °HOW TO MAKE AN ICE PACK °Ice packs are the most common way to use ice therapy. Other methods include ice massage, ice baths, and cryosprays. Muscle creams that cause a cold, tingly feeling do not offer the same benefits that ice offers and should not be used as a substitute unless recommended by your caregiver. °To make an ice pack, do one of the following: °· Place crushed ice or a   bag of frozen vegetables in a sealable plastic bag. Squeeze out the excess air. Place this bag inside another plastic bag. Slide the bag into a pillowcase or place a damp towel between your skin and the bag.  Mix 3 parts water with 1 part rubbing alcohol. Freeze the mixture in a sealable plastic bag. When you remove the mixture from the freezer, it will be slushy. Squeeze out the excess air. Place this bag inside another plastic bag. Slide the bag into a pillowcase or place a damp towel between your skin and the bag. SEEK MEDICAL CARE  IF:  You develop white spots on your skin. This may give the skin a blotchy (mottled) appearance.  Your skin turns blue or pale.  Your skin becomes waxy or hard.  Your swelling gets worse. MAKE SURE YOU:   Understand these instructions.  Will watch your condition.  Will get help right away if you are not doing well or get worse.   This information is not intended to replace advice given to you by your health care provider. Make sure you discuss any questions you have with your health care provider.   Document Released: 11/12/2010 Document Revised: 04/08/2014 Document Reviewed: 11/12/2010 Elsevier Interactive Patient Education 2016 [place] A hand contusion is a deep bruise on your hand area. Contusions are the result of an injury that caused bleeding under the skin. The contusion may turn blue, purple, or yellow. Minor injuries will give you a painless contusion, but more severe contusions may stay painful and swollen for a few weeks. CAUSES  A contusion is usually caused by a blow, trauma, or direct force to an area of the body. SYMPTOMS   Swelling and redness of the injured area.  Discoloration of the injured area.  Tenderness and soreness of the injured area.  Pain. DIAGNOSIS  The diagnosis can be made by taking a history and performing a physical exam. An X-ray, CT scan, or MRI may be needed to determine if there were any associated injuries, such as broken bones (fractures). TREATMENT  Often, the best treatment for a hand contusion is resting, elevating, icing, and applying cold compresses to the injured area. Over-the-counter medicines may also be recommended for pain control. HOME CARE INSTRUCTIONS   Put ice on the injured area.  Put ice in a plastic bag.  Place a towel between your skin and the bag.  Leave the ice on for 15-20 minutes, 03-04 times a day.  Only take over-the-counter or prescription medicines as directed by your caregiver.  Your caregiver may recommend avoiding anti-inflammatory medicines (aspirin, ibuprofen, and naproxen) for 48 hours because these medicines may increase bruising.  If told, use an elastic wrap as directed. This can help reduce swelling. You may remove the wrap for sleeping, showering, and bathing. If your fingers become numb, cold, or blue, take the wrap off and reapply it more loosely.  Elevate your hand with pillows to reduce swelling.  Avoid overusing your hand if it is painful. SEEK IMMEDIATE MEDICAL CARE IF:   You have increased redness, swelling, or pain in your hand.  Your swelling or pain is not relieved with medicines.  You have loss of feeling in your hand or are unable to move your fingers.  Your hand turns cold or blue.  You have pain when you move your fingers.  Your hand becomes warm to the touch.  Your contusion does not improve in 2 days. MAKE SURE YOU:   Understand  these instructions.  Will watch your condition.  Will get help right away if you are not doing well or get worse.   This information is not intended to replace advice given to you by your health care provider. Make sure you discuss any questions you have with your health care provider.   Document Released: 09/07/2001 Document Revised: 12/11/2011 Document Reviewed: 09/09/2011 Elsevier Interactive Patient Education [employer].

## 2015-02-07 ENCOUNTER — Other Ambulatory Visit: Payer: Self-pay | Admitting: Neurology

## 2015-02-07 ENCOUNTER — Ambulatory Visit
Admission: RE | Admit: 2015-02-07 | Discharge: 2015-02-07 | Disposition: A | Discharge status: Home / Self Care | Payer: Medicare Other | Source: Ambulatory Visit | Attending: Neurology | Admitting: Neurology

## 2015-02-07 DIAGNOSIS — R6889 Other general symptoms and signs: Secondary | ICD-10-CM | POA: Diagnosis not present

## 2015-02-07 DIAGNOSIS — H5340 Unspecified visual field defects: Secondary | ICD-10-CM

## 2015-02-07 DIAGNOSIS — H00013 Hordeolum externum right eye, unspecified eyelid: Secondary | ICD-10-CM | POA: Diagnosis not present

## 2015-02-07 DIAGNOSIS — G620 Drug-induced polyneuropathy: Secondary | ICD-10-CM | POA: Diagnosis not present

## 2015-02-07 DIAGNOSIS — G250 Essential tremor: Secondary | ICD-10-CM | POA: Diagnosis not present

## 2015-02-07 DIAGNOSIS — R55 Syncope and collapse: Secondary | ICD-10-CM | POA: Diagnosis not present

## 2015-02-07 DIAGNOSIS — T451X5A Adverse effect of antineoplastic and immunosuppressive drugs, initial encounter: Secondary | ICD-10-CM

## 2015-02-07 DIAGNOSIS — G932 Benign intracranial hypertension: Secondary | ICD-10-CM

## 2015-02-07 DIAGNOSIS — IMO0001 Reserved for inherently not codable concepts without codable children: Secondary | ICD-10-CM

## 2015-02-07 LAB — CSF CELL COUNT WITH DIFFERENTIAL
RBC Count, CSF: 1 cu mm — ABNORMAL HIGH
TUBE #: 4
WBC, CSF: 1 cu mm (ref 0–5)

## 2015-02-07 LAB — GLUCOSE, CSF: GLUCOSE CSF: 79 mg/dL — AB (ref 43–76)

## 2015-02-07 LAB — PROTEIN, CSF: TOTAL PROTEIN, CSF: 36 mg/dL (ref 15–45)

## 2015-02-07 NOTE — Discharge Instructions (Signed)

## 2015-02-09 ENCOUNTER — Ambulatory Visit (HOSPITAL_BASED_OUTPATIENT_CLINIC_OR_DEPARTMENT_OTHER): Payer: Medicare Other

## 2015-02-09 VITALS — BP 120/65 | HR 74 | Temp 98.7°F

## 2015-02-09 DIAGNOSIS — C50412 Malignant neoplasm of upper-outer quadrant of left female breast: Secondary | ICD-10-CM | POA: Diagnosis not present

## 2015-02-09 DIAGNOSIS — Z5111 Encounter for antineoplastic chemotherapy: Secondary | ICD-10-CM

## 2015-02-09 MED ORDER — GOSERELIN ACETATE 3.6 MG ~~LOC~~ IMPL
3.6000 mg | DRUG_IMPLANT | Freq: Once | SUBCUTANEOUS | Status: AC
  Administered 2015-02-09: 3.6 mg via SUBCUTANEOUS
  Filled 2015-02-09: qty 3.6

## 2015-02-10 ENCOUNTER — Encounter: Payer: Self-pay | Admitting: Internal Medicine

## 2015-02-10 ENCOUNTER — Ambulatory Visit (INDEPENDENT_AMBULATORY_CARE_PROVIDER_SITE_OTHER): Payer: Medicare Other | Admitting: Internal Medicine

## 2015-02-10 VITALS — BP 124/78 | HR 87 | Ht 66.0 in | Wt 197.8 lb

## 2015-02-10 DIAGNOSIS — J4540 Moderate persistent asthma, uncomplicated: Secondary | ICD-10-CM | POA: Diagnosis not present

## 2015-02-10 MED ORDER — PREDNISONE 10 MG PO TABS: ORAL_TABLET | ORAL | Status: DC

## 2015-02-10 NOTE — Progress Notes (Signed)
Subjective:    Patient ID: Erica [name], female     DOB: 1975/09/10    MRN: [id number]    Brief patient profile:  34 yobf with asthma all her life much worse since around 07/2010 referred by Dr [name] to pulmonary clinic in Sept 2012 Hx of Breast Cancer 05/2008    History of Present Illness  12/18/2010 Initial pulmonary office eval on acei and  advair cc dtc asthma x 4 months but baseline = excess saba use "even when better" at least twice daily despite daily advair, worse at hs in fact  used saba 3 x last night.  On chemo /RT for breast ca but last rx was Jan 2012 well before asthma worse.  To er multiple times >  no better with singular added x 3 months. rec Stop Lisinopril Dulera 200 Take 2 puffs first thing in am and then another 2 puffs about 12 hours later.  Work on inhaler technique  Nexium 40 mg   Take 30-60 min before first meal of the day and Pepcid 20 mg one bedtime until return Only use your albuterol as a rescue medication to be used if you can't catch your breath by resting or doing a relaxed purse lip breathing pattern. The less you use it, the better it will work when you need it.  Prednisone 10 mg take  4 each am x 2 days,   2 each am x 2 days,  1 each am x2days and stop    01/03/2011 f/u ov/Kyzen Horn cc much better off ace and advair but still needing saba twice daily on avg. No  Purulent sputum. rec no change in rx/ rule of 2's reviewed     04/06/2013 NP Follow up and Med Review   We reviewed all her meds and organized them into a med calendar. She does have Medicaid but depends on samples. Did not bring all her meds with her today , says they are at home.  Says her breathing is the same since last ov w/ SOB, Has on /off wheezing, some chest heaviness, prod cough with yellow mucus. Good and bad days. Wears out easily .  Denies f/c/s, nausea, vomiting.  Did not stop singulair as recommended.  rec  Follow med calendar closely and bring to each visit > did not do  We are  referring you for a CPSTwith spirometry before and after> never done   04/18/2014 NP Follow up and Med Review / NP  Patient returns for a two-week follow-up and medication review. We reviewed all her medications organize them into a medication count with patient education It appears that she is taking her medications correctly, however, she has over 30 medications and has had several changes recently. Last visit, her [name] was decreased to the 162mcg says that her breathing was better on the 210mcg Patient had a chest x-ray last visit that showed no acute changes She underwent a CT sinus due to persistent cough , which was positive for sinusitis on the right She was treated with a ten-day course of Avelox, she has one dose left Does feel some better but still has cough . She denies any hemoptysis, orthopnea, PND or leg swelling  Patient has upcoming sleep study. Next week for suspected underlying sleep apnea with snoring and daytime sleepiness rec Refer to ENT for sinusitis and chronic cough > never saw one> f/u sinus CT neg 07/26/14  Increase Dulera 200 2 puffs Twice daily   Follow med calendar closely  and bring to each visit.     08/01/2014 f/u ov/Jazman Reuter re:  Asthma plus noct "gasping"  Chief Complaint  Patient presents with  . Follow-up    doing well.  no complaints.  not doing well x one year with noct / early  4am with cough/ gasping for air almost always goes to neb and then can't go back to sleep even 30 min later better Daytime in chest tightness resolves  completely while on prednisone last took one month prior to OV  And doesn't have it now but has exact same noct symptoms and overusing saba  Doe x mailbox and back to house out of breath and coughing  >>change dulera 200 to 100   08/30/2014 NP Follow up and Med review : Asthma Patient presents for a one-month follow-up We reviewed all her medications organize them into a medication count with patient education Patient has had  multiple medication changes recently She has been having difficulty with resistant hypertension and visual changes. Recently started on primidone and spironolactone . However, has not started this as of yet That she needs to go pick this up at the pharmacy. Neuro notes reviewed , has been referred to NS . B/p is elevated again at todays visit, discussed follow up with PCP for b/p management.  She tried to decrease her Dulera as recommended from last ov with Dr. Melvyn Novas    however, was unable to and is back on 200 dose .  Says overall her breathing is doing ok  No flare of cough or wheezing  Does have a lot of drainage in throat . Discussed starting on chlortrimeton.  rec Continue Dulera 200 2 puffs Twice daily  , rinse after use  Follow med calendar closely and bring to each visit.  Follow up with Primary MD concerning elevated blood pressure    10/05/2014 f/u ov/Alianys Chacko re: asthma with component of uacs/ no med calendar  Chief Complaint  Patient presents with  . Follow-up    Here to discuss meds- Dulera not on her drug formulary. Pt states breathing is doing well and denies any new co's today.  turns out using neb at least once every 24 h, esp between 2 and 4 am when wakes up with severe dry cough x months/ pred helps some but never eliminated noct cough rec symbicort 160 Take 2 puffs first thing in am and then another 2 puffs about 12 hours later.  Try taking the tramadol around 2 am to see if helps with the early am cough/ wheeze  See calendar for specific medication instructions and bring it back for each and every office visit  Separating the top medications from the bottom group is fundamental to providing you adequate care going forward.   Please schedule a follow up office visit in 2 weeks, sooner if needed with all medications in 2 separate bags    10/19/2014 f/u ov/Anaisa Radi re:  Asthma vs uacs/ not using med calendar correclty  Chief Complaint  Patient presents with  . Follow-up     Cough has changed sicnce starting symbicort- prod cough with "dirty" sputum. She states she feels that she can tell when her PM dose is due b/c she starts feeling SOB. She is using proair once per day on average.   not using symbicort 160 correctly  rec Prednisone 10 mg take  4 each am x 2 days,  2 each am x 2 days,  1 each am x 2 days and stop  Bystolic  10 mg take twice daily until return  Take 2 chlortrimeton at bedtime and 2 more 4 hours later  Continue symbicort 160 Take 2 puffs first thing in am and then another 2 puffs about 12 hours later.  Only use your albuterol as rescue  Only use nebulizer if you try proventil first and it fails to work    11/08/2014 Follow up : Asthma /UAC   Pt returns for follow up .  Last ov with asthma flare , tx with pred taper  She is feeling better Breathing is doing better and back to her baseline  Still has dry cough but some better.  We reviewed all her meds and organized them into a med calendar with pt education  Appears to be taking correctly although she is on over 30 medications .  B/p is improved on current regimen.  rec Follow med calendar     02/10/2015  f/u ov/Darroll Bredeson re: atiypical asthma/ vcd suspected  Chief Complaint  Patient presents with  . Follow-up    C/o increase SOB, prod cough (tan color phlem), wheezing, chest tx, nasal cong  worse x one week but at baseline doe x more than slow walk and neb q day around 4 am as the hfa not helping with noct symptoms no better even on prednisone   No obvious day to day or daytime variability or assoc   cp or chest tightness, subjective wheeze or overt sinus or hb symptoms. No unusual exp hx or h/o childhood pna/ asthma or knowledge of premature birth.  Sleeping ok without nocturnal  or early am exacerbation  of respiratory  c/o's or need for noct saba. Also denies any obvious fluctuation of symptoms with weather or environmental changes or other aggravating or alleviating factors except as  outlined above   Current Medications, Allergies, Complete Past Medical History, Past Surgical History, Family History, and Social History were reviewed in [employer] record.  ROS  The following are not active complaints unless bolded sore throat, dysphagia, dental problems, itching, sneezing,  nasal congestion or excess/ purulent secretions, ear ache,   fever, chills, sweats, unintended wt loss, classically pleuritic or exertional cp, hemoptysis,  orthopnea pnd or leg swelling, presyncope, palpitations, abdominal pain, anorexia, nausea, vomiting, diarrhea  or change in bowel or bladder habits, change in stools or urine, dysuria,hematuria,  rash, arthralgias, visual complaints, headache, numbness, weakness or ataxia or problems with walking or coordination,  change in mood/affect or memory.                             Objective:   Physical Exam  Obese bf nad  vital signs reviewed  Wt 213  12/18/10 > 01/03/2011  214 > 01/31/2011  206 >  09/11/2012 209 >208 6/19 > 210 09/25/2012 > 11/20/2012 211 >208 12/07/12> 01/12/2013 209 > 02/09/2013 210 >207 04/06/2013 > 07/14/2013  213>212 07/28/2013 >204 01/26/2014> 04/04/2014 206  >205 04/18/2014 >  07/18/2014 201 >  08/01/14 201 >198 08/30/2014 > 10/05/14  195 > 10/19/2014 192 > 191 11/08/2014 > 02/10/2015  198   HEENT: nl dentition, turbinates, and orophanx. Nl external ear canals without cough reflex,     NECK :  without JVD/Nodes/TM/ nl carotid upstrokes bilaterally   LUNGS: lungs clear bilaterally with good air movement  CV:  RRR  no s3 or murmur or increase in P2, no edema   ABD:  soft and nontender with nl excursion in the  supine position. No bruits or organomegaly, bowel sounds nl  MS:  warm without deformities, calf tenderness, cyanosis or clubbing       CXR PA and Lateral:   02/10/2015 :    Did not go as requested

## 2015-02-10 NOTE — Patient Instructions (Addendum)
Prednisone 10 mg take  4 each am x 2 days,   2 each am x 2 days,  1 each am x 2 days and stop   Please remember to go to the  x-ray department downstairs for your tests - we will call you with the results when they are available.     Please schedule a follow up office visit in 6 weeks, call sooner if needed

## 2015-02-12 NOTE — Assessment & Plan Note (Signed)
Complicated by reflux/ hpb/ restrictive pfts  Body mass index is 31.94 trending up   Lab Results  Component Value Date   TSH 0.570 09/20/2014     Contributing to gerd tendency/ doe/reviewed the need and the process to achieve and maintain neg calorie balance > defer f/u primary care including intermittently monitoring thyroid status

## 2015-02-12 NOTE — Assessment & Plan Note (Addendum)
-   PFT's 01/31/2011 FEV1  1.76 (60% ) ratio 68 and no better p B2,  DLCO 70% -med review 09/25/2012 > Dulera drug assistance  paperwork given    - HFA 90% 11/20/2012 .  -Med calendar 12/07/12 , 04/06/2013 , 04/18/2014,, 08/30/2014 , 11/08/2014  - PFT's 01/12/2013  1.73 (65%)  Ratio 73 and no better p B2, DLCO 86% - trial off singulair 02/08/13 >>>did not stop  - 04/05/2014  trial of dulera 100 instead of 200 > worse  -04/18/2014 increase Dulera 200  - 08/01/2014 p extensive coaching HFA effectiveness =    90% - 08/01/2014  Walked RA x 2laps @ 185 ft each stopped due to cough > sob/ no desat moderate pace  - spirometry 08/01/2014  FEV1  1.87 (70%) ratio 78  fef 25-75 53%    - 02/10/2015  Walked RA x 3 laps @ 185 ft each stopped due to   - spirometry 02/10/2015 flattening of top portion of f/v loop only  - 02/10/2015  extensive coaching HFA effectiveness =    90%  - 02/10/2015   Walked RA  2 laps @ 185 ft each stopped due to cough > sob/ slow pace     Not convinced at all this is really asthma but more likely  Classic Upper airway cough syndrome, so named because it's frequently impossible to sort out how much is  CR/sinusitis with freq throat clearing (which can be related to primary GERD)   vs  causing  secondary (" extra esophageal")  GERD from wide swings in gastric pressure that occur with throat clearing, often  promoting self use of mint and menthol lozenges that reduce the lower esophageal sphincter tone and exacerbate the problem further in a cyclical fashion.   These are the same pts (now being labeled as having "irritable larynx syndrome" by some cough centers) who not infrequently have a history of having failed to tolerate ace inhibitors,  dry powder inhalers or biphosphonates or report having atypical reflux symptoms that don't respond to standard doses of PPI , and are easily confused as having aecopd or asthma flares by even experienced allergists/ pulmonologists.   For now rx with Prednisone 10 mg  take  4 each am x 2 days,   2 each am x 2 days,  1 each am x 2 days and stop    I had an extended discussion with the patient reviewing all relevant studies completed to date and  lasting 15 to 20 minutes of a 25 minute visit    Each maintenance medication was reviewed in detail including most importantly the difference between maintenance and prns and under what circumstances the prns are to be triggered using an action plan format that is not reflected in the computer generated alphabetically organized AVS but trather by a customized med calendar that reflects the AVS meds with confirmed 100% correlation.   Please see instructions for details which were reviewed in writing and the patient given a copy highlighting the part that I personally wrote and discussed at today's ov.

## 2015-02-20 ENCOUNTER — Ambulatory Visit: Payer: Medicare Other | Admitting: Cardiology

## 2015-02-21 ENCOUNTER — Encounter: Payer: Self-pay | Admitting: Cardiology

## 2015-02-21 ENCOUNTER — Ambulatory Visit (INDEPENDENT_AMBULATORY_CARE_PROVIDER_SITE_OTHER): Payer: Medicare Other | Admitting: Cardiology

## 2015-02-21 VITALS — BP 158/82 | HR 100 | Ht 66.0 in | Wt 197.0 lb

## 2015-02-21 DIAGNOSIS — R55 Syncope and collapse: Secondary | ICD-10-CM

## 2015-02-21 DIAGNOSIS — I10 Essential (primary) hypertension: Secondary | ICD-10-CM

## 2015-02-21 DIAGNOSIS — E785 Hyperlipidemia, unspecified: Secondary | ICD-10-CM

## 2015-02-21 DIAGNOSIS — R0989 Other specified symptoms and signs involving the circulatory and respiratory systems: Secondary | ICD-10-CM

## 2015-02-21 DIAGNOSIS — G47419 Narcolepsy without cataplexy: Secondary | ICD-10-CM

## 2015-02-21 NOTE — Progress Notes (Signed)
Patient ID: Erica [name], female   DOB: 1975/10/09, 39 y.o.   MRN: [id number]    Patient Name: Erica [name] Date of Encounter: 02/21/2015  Primary Care Provider:  Scarlette Calico, MD Primary Cardiologist:  Ena Dawley H  Patient Profile  Chest pain  Problem List   Past Medical History  Diagnosis Date  . Asthma   . Hypertension   . GERD (gastroesophageal reflux disease)   . Impaired glucose tolerance 04/13/2011  . Anemia, unspecified 04/13/2011  . Asthma 09/27/2010  . Bloating 03/18/2011  . 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 Lymphadenitis, chronic     restricted LEFT extremity  . Dyspnea on exertion     a. 01/2013 Lexi MV: EF 54%, no ischemia/infarct;  b. 05/2013 Echo: EF 60-65%, no rwma, Gr 2 DD;  b.   . OSA (obstructive sleep apnea) 05/04/2014    AHI 31/hr now on CPAP at 12cm H2O  . Breast cancer ([place]) 2010    a. locally advanced left breast carcinoma diagnosed in 2010 and treated with neoadjuvant chemotherapy with docetaxel and cyclophosphamide as well as paclitaxel. This was followed by radiation therapy which was completed in 2011.  . Fatty liver   . Gastroparesis    Past Surgical History  Procedure Laterality Date  . Tendon release Right 2012    "hand"  . Breast lumpectomy Left 09/2008  . Carpal tunnel release Left 2011  . Tubal ligation  05/11/2011    Procedure: POST PARTUM TUBAL LIGATION;  Surgeon: Emeterio Reeve, MD;  Location: [facility];  Service: Gynecology;  Laterality: Bilateral;  Induced for HTN  . Lymph node dissection Left 2010    breast; 2 wks after breast lumpectomy   Allergies  Allergies  Allergen Reactions  . Aspirin Shortness Of Breath and Palpitations    Pt can take ibuprofen without reaction  . Doxycycline Anaphylaxis  . Penicillins Shortness Of Breath and Palpitations  . Kiwi Extract Itching and Swelling    Lip swelling  . Strawberry Extract Hives   Chief complain: recurrent syncope  HPI  39 year old female with h/o asthma, obesity,  hypertension, who is coming with complaints of chest pain. This pleasant young female has a history of locally advanced left breast carcinoma diagnosed in 2010 and treated with neoadjuvant chemotherapy with docetaxel and cyclophosphamide as well as paclitaxel. This was followed by radiation therapy which was completed in 2011. The patient complains of chest pain it is not exertional, it is locally located in the retrosternal and left epigastric region. It has been happening anytime of the day, and can last for minutes. There is no clear aggravating or alleviating factor. The patient states that ever since her chemotherapy she gets short of breath with minimal activity and states that she will be able to walk a block.  09/27/2013 - the patient is coming complaining of continuous daily wheezing, and nonexertional right upper quadrant pain that force her to go to the ER. A complete workup including abdominal ultrasound and CAT scan was performed. The only finding was fatty liver no acute pathology in the gallbladder no gallstones. The patient had an upper EGD last year where her gastroparesis was diagnosed and she was started on metoclopramide and proton next. Her symptoms improved however she continues having episodes of this pain. She feels overall very tired and short of breath and complaints of orthopnea.  11/30/2013 - we stopped Bystolic at the last visit as it caused wheezing, her wheezing has improved but she  feels more SOB. She is trying to exercise but gets SOB easily.   03/14/2014 -  The patient is undergoing cardiac rehabilitation and has experienced at least 2 episodes of syncope that are not related to exertion.  One of her episodes was while she was wearing Holter monitor and strips only showed sinus rhythm. The patient is being evaluated for sleep apnea in January.She continues going to cardiac rehabilitation and enjoys it very well she overall is feels better.She brings her blood pressure log and  her blood pressure  Are in the range [id number] for systolic blood pressure. She also complains of lower extremity edema that happens on certain days.  05/03/2014 - the patient is coming with complaint of headache with Imdur. She still taking it. She wakes up every night with shortness of breath that resolves after she uses albuterol inhaler. She tries to exercise but bending makes her feel more short of breath. She underwent sleep study and is awaiting results.  02/21/2015 - 6 months follow up, the patient underwent a sleep study that showed severe OSA with AHI of 31/hr and had successful CPAP titration to 12cm H2O.  The patient states that she can't tolerate CPAP machine "feels like I am suffocating", even after parameter adjustment. She is supposed to undergo another sleep study awaiting a call from sleep center.  She continues to have narcoleptic episodes with falls. No palpitations or syncope, no CP, stable DOE. No LE edema, orthopnea or PND.   Home Medications  Prior to Admission medications   Medication Sig Start Date End Date Taking? Authorizing Provider  albuterol (PROVENTIL HFA;VENTOLIN HFA) 108 (90 BASE) MCG/ACT inhaler Inhale 2 puffs into the lungs every 4 (four) hours as needed for wheezing or shortness of breath (((PLAN A))).     Historical Provider, MD  albuterol (PROVENTIL) (2.5 MG/3ML) 0.083% nebulizer solution Take 2.5 mg by nebulization every 4 (four) hours as needed for wheezing or shortness of breath (((PLAN B))). For shortness of breath. 05/07/12   Amy Milda Smart, PA-C  amLODipine-olmesartan (AZOR) 5-40 MG per tablet Take 1 tablet by mouth daily. 08/05/12   [name], MD  cloNIDine (CATAPRES) 0.2 MG tablet Take 1 tablet (0.2 mg total) by mouth 2 (two) times daily. 08/05/12   [name], MD  hyoscyamine (LEVSIN SL) 0.125 MG SL tablet Place 0.125 mg under the tongue every 8 (eight) hours as needed for cramping. 05/19/12   Jerene Bears, MD  LORazepam (ATIVAN) 0.5 MG tablet Take 1 tablet  (0.5 mg total) by mouth at bedtime as needed for anxiety. 01/07/13   Chauncey Cruel, MD  metoCLOPramide (REGLAN) 10 MG tablet Take 1 tablet (10 mg total) by mouth 4 (four) times daily -  with meals and at bedtime. 01/14/13   Jerene Bears, MD  mometasone-formoterol (DULERA) 100-5 MCG/ACT AERO Inhale 2 puffs into the lungs 2 (two) times daily. 05/07/12   Amy Milda Smart, PA-C  montelukast (SINGULAIR) 10 MG tablet Take 1 tablet (10 mg total) by mouth at bedtime. 12/31/11   Chauncey Cruel, MD  nebivolol (BYSTOLIC) 10 MG tablet Take 10 mg by mouth at bedtime.    Historical Provider, MD  pantoprazole (PROTONIX) 40 MG tablet Take 1 tablet (40 mg total) by mouth daily. 01/07/13   Chauncey Cruel, MD  potassium chloride SA (K-DUR,KLOR-CON) 20 MEQ tablet Take 20 mEq by mouth 2 (two) times daily. 06/04/12   [name], MD  sucralfate (CARAFATE) 1 G tablet Take 1  tablet (1 g total) by mouth 4 (four) times daily. 03/07/12   [name], MD  traMADol Veatrice Bourbon) 50 MG tablet 1-2 tabs by mouth every 4 hours as needed for cough or pain 09/10/12   Amy Milda Smart, PA-C  triamterene-hydrochlorothiazide (MAXZIDE-25) 37.5-25 MG per tablet Take 1 tablet by mouth every morning.    Historical Provider, MD    Family History  Family History  Problem Relation Age of Onset  . Diabetes Maternal Grandmother   . Heart disease Maternal Grandmother   . Hypertension Maternal Grandmother   . Cancer Neg Hx   . Alcohol abuse Neg Hx   . Early death Neg Hx   . Hyperlipidemia Neg Hx   . Kidney disease Neg Hx   . Stroke Neg Hx   . Colon cancer Neg Hx     Social History  Social History   Social History  . Marital Status: Single    Spouse Name: N/A  . Number of Children: 3  . Years of Education: N/A   Occupational History  .     Social History Main Topics  . Smoking status: Never Smoker   . Smokeless tobacco: Never Used  . Alcohol Use: 0.0 oz/week    0 Standard drinks or equivalent per week     Comment: occasionally   . Drug Use: No  . Sexual Activity: Yes    Birth Control/ Protection: Surgical   Other Topics Concern  . Not on file   Social History Narrative   She lives with four children.   She is currently not working (disbaility pending).           Review of Systems General:  No chills, fever, night sweats or weight changes.  Cardiovascular:  No chest pain, dyspnea on exertion, edema, orthopnea, palpitations, paroxysmal nocturnal dyspnea. Dermatological: No rash, lesions/masses Respiratory: No cough, dyspnea Urologic: No hematuria, dysuria Abdominal:   No nausea, vomiting, diarrhea, bright red blood per rectum, melena, or hematemesis Neurologic:  No visual changes, wkns, changes in mental status. All other systems reviewed and are otherwise negative except as noted above.  Physical Exam Blood pressure 158/82, pulse 100, height 5\' 6"  (1.676 m), weight 197 lb (89.359 kg).  General: Pleasant, NAD Psych: Normal affect. Neuro: Alert and oriented X 3. Moves all extremities spontaneously. HEENT: Normal  Neck: Supple without bruits or JVD. Lungs:  Resp regular and unlabored, CTA. Heart: RRR no s3, s4, or murmurs. Abdomen: Soft, non-tender, non-distended, BS + x 4.  Extremities: No clubbing, cyanosis or edema. DP/PT/Radials 2+ and equal bilaterally.  Lipid Panel     Component Value Date/Time   CHOL 172 02/03/2014 1144   TRIG 151.0* 02/03/2014 1144   HDL 32.00* 02/03/2014 1144   CHOLHDL 5 02/03/2014 1144   VLDL 30.2 02/03/2014 1144   LDLCALC 110* 02/03/2014 1144   Nuclear stress test: 02/15/2013 Impression  Exercise Capacity: Lexiscan with no exercise.  BP Response: Hypertensive blood pressure response.  Clinical Symptoms: Chest pain, headache.  ECG Impression: No significant ST segment change suggestive of ischemia.  Comparison with Prior Nuclear Study: No images to compare  Overall Impression: Normal stress nuclear study.  LV Ejection Fraction: 54%. LV Wall Motion: NL LV  Function; NL Wall Motion  Loralie Champagne  02/12/2013   TTE 06/17/2013  Left ventricle: The cavity size was normal. There was mild concentric hypertrophy. Systolic function was normal. The estimated ejection fraction was in the range of 60% to 65%. Wall motion was normal; there were  no regional wall motion abnormalities. Features are consistent with a pseudonormal left ventricular filling pattern, with concomitant abnormal relaxation and increased filling pressure (grade 2 diastolic dysfunction). Doppler parameters are consistent with high ventricular filling pressure. Impressions: - When compared to 2010, EF has improved.  Accessory Clinical Findings  ECG - normal sinus rhythm, 100 beats per minute, normal EKG.    Assessment & Plan  39 year old female with prior medical history of hyperlipidemia, hypertension and breast carcinoma with chemotherapy that can potentially cause heart block, heart failure and angina.  1.  Recurrent syncope -  E-cardio monitor showed only sinus rhythm. This is possibly due to narcolepsy, sleep study showed severe sleep apnea, she is non-complaint with CPAP machine. Strongly advised to call sleep center for her repeat sleep study.  Advised to take it easy after standing up, to avoid orthostatic hypotension. The problem is that she is hypertensive despite several antihypertensives including vasodilators that are most probably contributing to her symptoms. Unable to wean off.   2. Chronic diastolic CHF, associated with dyspnea on exertion - A lexiscan nuclear stress test showed poor exercise capacity, LV EF 54% and no perfusion defect.  Echocardiogram showed preserved LV function it is improved compared to prior echo however she has pseudo-normal pattern of diastolic dysfunction with elevated filling pressures.  Her blood pressure management has been number of challenging as her blood pressure is labile and can be very low as well.  She is euvolemic right now,  we will continue the same regimen.  3. HTN - labile,  normal renal arterial hypertension, elevatde today, controlled at home  4. OSA - severe, non-complaint with CPAP, as above  5. Lipid profile - LDL 129 just recommend exercise and diet.  8. 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  [name], MD 02/21/2015, 3:33 PM

## 2015-02-21 NOTE — Patient Instructions (Signed)
Your physician recommends that you continue on your current medications as directed. Please refer to the Current Medication list given to you today.     Your physician wants you to follow-up in: 6 MONTHS WITH DR NELSON You will receive a reminder letter in the mail two months in advance. If you don't receive a letter, please call our office to schedule the follow-up appointment.  

## 2015-03-08 ENCOUNTER — Other Ambulatory Visit: Payer: Self-pay | Admitting: *Deleted

## 2015-03-08 DIAGNOSIS — N938 Other specified abnormal uterine and vaginal bleeding: Secondary | ICD-10-CM

## 2015-03-08 DIAGNOSIS — C50412 Malignant neoplasm of upper-outer quadrant of left female breast: Secondary | ICD-10-CM

## 2015-03-08 MED ORDER — GOSERELIN ACETATE 3.6 MG ~~LOC~~ IMPL
3.6000 mg | DRUG_IMPLANT | Freq: Once | SUBCUTANEOUS | Status: DC
  Filled 2015-03-08: qty 3.6

## 2015-03-09 ENCOUNTER — Ambulatory Visit (HOSPITAL_BASED_OUTPATIENT_CLINIC_OR_DEPARTMENT_OTHER): Payer: Medicare Other

## 2015-03-09 ENCOUNTER — Other Ambulatory Visit: Payer: Self-pay | Admitting: Oncology

## 2015-03-09 VITALS — BP 176/99 | HR 96 | Temp 98.8°F

## 2015-03-09 DIAGNOSIS — C50412 Malignant neoplasm of upper-outer quadrant of left female breast: Secondary | ICD-10-CM

## 2015-03-09 DIAGNOSIS — Z5111 Encounter for antineoplastic chemotherapy: Secondary | ICD-10-CM

## 2015-03-09 MED ORDER — GOSERELIN ACETATE 3.6 MG ~~LOC~~ IMPL
3.6000 mg | DRUG_IMPLANT | Freq: Once | SUBCUTANEOUS | Status: AC
  Administered 2015-03-09: 3.6 mg via SUBCUTANEOUS
  Filled 2015-03-09: qty 3.6

## 2015-03-24 ENCOUNTER — Encounter: Payer: Self-pay | Admitting: Internal Medicine

## 2015-03-24 ENCOUNTER — Ambulatory Visit (INDEPENDENT_AMBULATORY_CARE_PROVIDER_SITE_OTHER): Payer: Medicare Other | Admitting: Internal Medicine

## 2015-03-24 ENCOUNTER — Ambulatory Visit (INDEPENDENT_AMBULATORY_CARE_PROVIDER_SITE_OTHER)
Admission: RE | Admit: 2015-03-24 | Discharge: 2015-03-24 | Disposition: A | Discharge status: Home / Self Care | Payer: Medicare Other | Source: Ambulatory Visit | Attending: Internal Medicine | Admitting: Internal Medicine

## 2015-03-24 VITALS — BP 148/92 | HR 127 | Ht 66.0 in | Wt 198.4 lb

## 2015-03-24 DIAGNOSIS — J4540 Moderate persistent asthma, uncomplicated: Secondary | ICD-10-CM

## 2015-03-24 DIAGNOSIS — R05 Cough: Secondary | ICD-10-CM | POA: Diagnosis not present

## 2015-03-24 DIAGNOSIS — R0602 Shortness of breath: Secondary | ICD-10-CM | POA: Diagnosis not present

## 2015-03-24 DIAGNOSIS — J45991 Cough variant asthma: Secondary | ICD-10-CM

## 2015-03-24 LAB — NITRIC OXIDE: Nitric Oxide: 16

## 2015-03-24 MED ORDER — FLUTTER DEVI: Status: AC

## 2015-03-24 MED ORDER — TRAMADOL HCL 50 MG PO TABS: ORAL_TABLET | ORAL | Status: DC

## 2015-03-24 MED ORDER — PREDNISONE 10 MG PO TABS: ORAL_TABLET | ORAL | Status: DC

## 2015-03-24 NOTE — Patient Instructions (Addendum)
Prednisone 10 mg take  4 each am x 2 days,   2 each am x 2 days,  1 each am x 2 days and stop   For cough use mucinex dm up to 1200 mg every 12 hours and the flutter valve as much as possible and if still can't control the cough > tramadol as per action plan on med calendar   Please remember to go to the   x-ray department downstairs for your tests - we will call you with the results when they are available.     See calendar for specific medication instructions and bring it back for each and every office visit for every healthcare provider you see.  Without it,  you may not receive the best quality medical care that we feel you deserve.  You will note that the calendar groups together  your maintenance  medications that are timed at particular times of the day.  Think of this as your checklist for what your doctor has instructed you to do until your next evaluation to see what benefit  there is  to staying on a consistent group of medications intended to keep you well.  The other group at the bottom is entirely up to you to use as you see fit  for specific symptoms that may arise between visits that require you to treat them on an as needed basis.  Think of this as your action plan or "what if" list.   Separating the top medications from the bottom group is fundamental to providing you adequate care going forward.    Late add: order methacholine challenge

## 2015-03-24 NOTE — Progress Notes (Signed)
Subjective:    Patient ID: Erica [name], female     DOB: 1975/09/10    MRN: [id number]    Brief patient profile:  34 yobf with asthma all her life much worse since around 07/2010 referred by Dr [name] to pulmonary clinic in Sept 2012 Hx of Breast Cancer 05/2008    History of Present Illness  12/18/2010 Initial pulmonary office eval on acei and  advair cc dtc asthma x 4 months but baseline = excess saba use "even when better" at least twice daily despite daily advair, worse at hs in fact  used saba 3 x last night.  On chemo /RT for breast ca but last rx was Jan 2012 well before asthma worse.  To er multiple times >  no better with singular added x 3 months. rec Stop Lisinopril Dulera 200 Take 2 puffs first thing in am and then another 2 puffs about 12 hours later.  Work on inhaler technique  Nexium 40 mg   Take 30-60 min before first meal of the day and Pepcid 20 mg one bedtime until return Only use your albuterol as a rescue medication to be used if you can't catch your breath by resting or doing a relaxed purse lip breathing pattern. The less you use it, the better it will work when you need it.  Prednisone 10 mg take  4 each am x 2 days,   2 each am x 2 days,  1 each am x2days and stop    01/03/2011 f/u ov/Erica [name] cc much better off ace and advair but still needing saba twice daily on avg. No  Purulent sputum. rec no change in rx/ rule of 2's reviewed     04/06/2013 NP Follow up and Med Review   We reviewed all her meds and organized them into a med calendar. She does have Medicaid but depends on samples. Did not bring all her meds with her today , says they are at home.  Says her breathing is the same since last ov w/ SOB, Has on /off wheezing, some chest heaviness, prod cough with yellow mucus. Good and bad days. Wears out easily .  Denies f/c/s, nausea, vomiting.  Did not stop singulair as recommended.  rec  Follow med calendar closely and bring to each visit > did not do  We are  referring you for a CPSTwith spirometry before and after> never done   04/18/2014 NP Follow up and Med Review / NP  Patient returns for a two-week follow-up and medication review. We reviewed all her medications organize them into a medication count with patient education It appears that she is taking her medications correctly, however, she has over 30 medications and has had several changes recently. Last visit, her [name] was decreased to the 162mcg says that her breathing was better on the 210mcg Patient had a chest x-ray last visit that showed no acute changes She underwent a CT sinus due to persistent cough , which was positive for sinusitis on the right She was treated with a ten-day course of Avelox, she has one dose left Does feel some better but still has cough . She denies any hemoptysis, orthopnea, PND or leg swelling  Patient has upcoming sleep study. Next week for suspected underlying sleep apnea with snoring and daytime sleepiness rec Refer to ENT for sinusitis and chronic cough > never saw one> f/u sinus CT neg 07/26/14  Increase Dulera 200 2 puffs Twice daily   Follow med calendar closely  and bring to each visit.     08/01/2014 f/u ov/Erica [name] re:  Asthma plus noct "gasping"  Chief Complaint  Patient presents with  . Follow-up    doing well.  no complaints.  not doing well x one year with noct / early  4am with cough/ gasping for air almost always goes to neb and then can't go back to sleep even 30 min later better Daytime in chest tightness resolves  completely while on prednisone last took one month prior to OV  And doesn't have it now but has exact same noct symptoms and overusing saba  Doe x mailbox and back to house out of breath and coughing  >>change dulera 200 to 100   08/30/2014 NP Follow up and Med review : Asthma Patient presents for a one-month follow-up We reviewed all her medications organize them into a medication count with patient education Patient has had  multiple medication changes recently She has been having difficulty with resistant hypertension and visual changes. Recently started on primidone and spironolactone . However, has not started this as of yet That she needs to go pick this up at the pharmacy. Neuro notes reviewed , has been referred to NS . B/p is elevated again at todays visit, discussed follow up with PCP for b/p management.  She tried to decrease her Dulera as recommended from last ov with Dr. Melvyn Novas    however, was unable to and is back on 200 dose .  Says overall her breathing is doing ok  No flare of cough or wheezing  Does have a lot of drainage in throat . Discussed starting on chlortrimeton.  rec Continue Dulera 200 2 puffs Twice daily  , rinse after use  Follow med calendar closely and bring to each visit.  Follow up with Primary MD concerning elevated blood pressure    10/05/2014 f/u ov/Erica [name] re: asthma with component of uacs/ no med calendar  Chief Complaint  Patient presents with  . Follow-up    Here to discuss meds- Dulera not on her drug formulary. Pt states breathing is doing well and denies any new co's today.  turns out using neb at least once every 24 h, esp between 2 and 4 am when wakes up with severe dry cough x months/ pred helps some but never eliminated noct cough rec symbicort 160 Take 2 puffs first thing in am and then another 2 puffs about 12 hours later.  Try taking the tramadol around 2 am to see if helps with the early am cough/ wheeze  See calendar for specific medication instructions and bring it back for each and every office visit  Separating the top medications from the bottom group is fundamental to providing you adequate care going forward.   Please schedule a follow up office visit in 2 weeks, sooner if needed with all medications in 2 separate bags    10/19/2014 f/u ov/Erica [name] re:  Asthma vs uacs/ not using med calendar correclty  Chief Complaint  Patient presents with  . Follow-up     Cough has changed sicnce starting symbicort- prod cough with "dirty" sputum. She states she feels that she can tell when her PM dose is due b/c she starts feeling SOB. She is using proair once per day on average.   not using symbicort 160 correctly  rec Prednisone 10 mg take  4 each am x 2 days,  2 each am x 2 days,  1 each am x 2 days and stop  Bystolic  10 mg take twice daily until return  Take 2 chlortrimeton at bedtime and 2 more 4 hours later  Continue symbicort 160 Take 2 puffs first thing in am and then another 2 puffs about 12 hours later.  Only use your albuterol as rescue  Only use nebulizer if you try proventil first and it fails to work    11/08/2014 Follow up : Asthma /UAC   Pt returns for follow up .  Last ov with asthma flare , tx with pred taper  She is feeling better Breathing is doing better and back to her baseline  Still has dry cough but some better.  We reviewed all her meds and organized them into a med calendar with pt education  Appears to be taking correctly although she is on over 30 medications .  B/p is improved on current regimen.  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 Follow-up    C/o increase SOB, prod cough (tan color phlem), wheezing, chest tx, nasal cong  worse x one week but at baseline doe x more than slow walk and neb q day around 4 am as the hfa not helping with noct symptoms no better even on prednisone rec Prednisone 10 mg take  4 each am x 2 days,   2 each am x 2 days,  1 each am x 2 days and stop      03/24/2015 f/u  Pulmonary office visit/ Erica [name]  Asthma vs vcd Chief Complaint  Patient presents with  . Follow-up    Pt c/o increased cough with tan mucus, wheeze and SOB. Pt reports increase in symptoms with weather changes. Pt has used albuterol HFA with some symptom relief. Pt denies f/n/v/d.   2 weeks prior to OV  Noted needing the noct neb again  where this problem had resolved on pred  then 2 d after that bad cough p [employer] shopping and worse since then but still comfortable p saba rx   No obvious patterns in day to day or daytime variability or assoc   cp or chest tightness, subjective wheeze or overt sinus or hb symptoms. No unusual exp hx or h/o childhood pna/ asthma or knowledge of premature birth.  Sleeping ok without nocturnal  or early am exacerbation  of respiratory  c/o's or need for noct saba. Also denies any obvious fluctuation of symptoms with weather or environmental changes or other aggravating or alleviating factors except as outlined above   Current Medications, Allergies, Complete Past Medical History, Past Surgical History, Family History, and Social History were reviewed in [employer] record.  ROS  The following are not active complaints unless bolded sore throat, dysphagia, dental problems, itching, sneezing,  nasal congestion or excess/ purulent secretions, ear ache,   fever, chills, sweats, unintended wt loss, classically pleuritic or exertional cp, hemoptysis,  orthopnea pnd or leg swelling, presyncope, palpitations, abdominal pain, anorexia, nausea, vomiting, diarrhea  or change in bowel or bladder habits, change in stools or urine, dysuria,hematuria,  rash, arthralgias, visual complaints, headache, numbness, weakness or ataxia or problems with walking or coordination,  change in mood/affect or memory.                    Objective:   Physical Exam  Obese bf nad  vital signs reviewed  Wt 213  12/18/10 > 01/03/2011  214 > 01/31/2011  206 >  09/11/2012 209 >208 6/19 > 210 09/25/2012 > 11/20/2012 211 >208 12/07/12> 01/12/2013 209 > 02/09/2013 210 >207 04/06/2013 > 07/14/2013  213>212 07/28/2013 >204 01/26/2014> 04/04/2014 206  >205 04/18/2014 >  07/18/2014 201 >  08/01/14 201 >198 08/30/2014 > 10/05/14  195 > 10/19/2014 192 > 191 11/08/2014 > 02/10/2015  198 > 03/24/2015   198   HEENT: nl dentition, turbinates,  and orophanx. Nl external ear canals without cough reflex,     NECK :  without JVD/Nodes/TM/ nl carotid upstrokes bilaterally   LUNGS: lungs clear bilaterally with good air movement  CV:  RRR  no s3 or murmur or increase in P2, no edema   ABD:  soft and nontender with nl excursion in the supine position. No bruits or organomegaly, bowel sounds nl  MS:  warm without deformities, calf tenderness, cyanosis or clubbing         CXR PA and Lateral:   03/24/2015 :    I personally reviewed images and agree with radiology impression as follows:    The heart size and mediastinal contours are within normal limits. Both lungs are clear. The visualized skeletal structures are unremarkable.

## 2015-03-27 ENCOUNTER — Encounter: Payer: Self-pay | Admitting: Internal Medicine

## 2015-03-27 NOTE — Assessment & Plan Note (Addendum)
- PFT's 01/31/2011 FEV1  1.76 (60% ) ratio 68 and no better p B2,  DLCO 70% -med review 09/25/2012 > Dulera drug assistance  paperwork given    - HFA 90% 11/20/2012 .  -Med calendar 12/07/12 , 04/06/2013 , 04/18/2014,, 08/30/2014 , 11/08/2014  - PFT's 01/12/2013  1.73 (65%)  Ratio 73 and no better p B2, DLCO 86% - trial off singulair 02/08/13 >>>did not stop  - 04/05/2014  trial of dulera 100 instead of 200 > worse  -04/18/2014 increase Dulera 200  - 08/01/2014 p extensive coaching HFA effectiveness =    90% - 08/01/2014  Walked RA x 2laps @ 185 ft each stopped due to cough > sob/ no desat moderate pace  - spirometry 08/01/2014  FEV1  1.87 (70%) ratio 78  fef 25-75 53%  - 02/10/2015  Walked RA x 3 laps @ 185 ft each stopped due to   - spirometry 02/10/2015  flattening of top portion of f/v loop only  - 02/10/2015  extensive coaching HFA effectiveness =    90%  - 02/10/2015   Walked RA  2 laps @ 185 ft each stopped due to cough > sob/ slow pace   -  NO 03/24/2015   =  16  Symptoms continue  To be  disproportionate to objective findings and not clear this is all a lung problem but pt does appear to have difficult airway management issues. DDX of  difficult airways management all start with A and  include Adherence, Ace Inhibitors, Acid Reflux, Active Sinus Disease, Alpha 1 Antitripsin deficiency, Anxiety masquerading as Airways dz,  ABPA,  allergy(esp in young), Aspiration (esp in elderly), Adverse effects of meds,  Active smokers, A bunch of PE's (a small clot burden can't cause this syndrome unless there is already severe underlying pulm or vascular dz with poor reserve) plus two Bs  = Bronchiectasis and Beta blocker use..and one C= CHF   Adherence is always the initial "prime suspect" and is a multilayered concern that requires a "trust but verify" approach in every patient - starting with knowing how to use medications, especially inhalers, correctly, keeping up with refills and understanding the fundamental  difference between maintenance and prns vs those medications only taken for a very short course and then stopped and not refilled.    ? Acid (or non-acid) GERD > always difficult to exclude as up to 75% of pts in some series report no assoc GI/ Heartburn symptoms> rec continue max (24h)  acid suppression and diet restrictions/ reviewed    ? Allergic asthma > very unlikley with NO so low though still could have eos bronchitis and non-allergic asthma suggested by resp to prednisone and non-specific triggers like shopping at [employer]/ in and out of buildings in cold weather > no change rx for now Prednisone 10 mg take  4 each am x 2 days,   2 each am x 2 days,  1 each am x 2 days and stop   ? Anxiety > usually at the bottom of this list of usual suspects but should be much higher on this pt's based on H and P     Need to proceed to methacholine challenge next   I had an extended discussion with the patient reviewing all relevant studies completed to date and  lasting 15 to 20 minutes of a 25 minute visit    Each maintenance medication was reviewed in detail including most importantly the difference between maintenance and prns and under what  circumstances the prns are to be triggered using an action plan format that is not reflected in the computer generated alphabetically organized AVS but trather by a customized med calendar that reflects the AVS meds with confirmed 100% correlation.   Please see instructions for details which were reviewed in writing and the patient given a copy highlighting the part that I personally wrote and discussed at today's ov.

## 2015-03-28 ENCOUNTER — Telehealth: Payer: Self-pay | Admitting: *Deleted

## 2015-03-28 ENCOUNTER — Encounter: Payer: Self-pay | Admitting: Cardiology

## 2015-03-28 DIAGNOSIS — R002 Palpitations: Secondary | ICD-10-CM

## 2015-03-28 DIAGNOSIS — R000 Tachycardia, unspecified: Secondary | ICD-10-CM

## 2015-03-28 DIAGNOSIS — J45991 Cough variant asthma: Secondary | ICD-10-CM

## 2015-03-28 NOTE — Telephone Encounter (Signed)
-----   Message from Tanda Rockers, MD sent at 03/27/2015  7:14 AM EST ----- Need to schedule Methacholine challenge

## 2015-03-28 NOTE — Telephone Encounter (Signed)
Spoke with the pt and notified of recs per MW  Order was sent to [facility] 

## 2015-03-28 NOTE — Progress Notes (Signed)
Quick Note:  Spoke with pt and notified of results per Dr. Wert. Pt verbalized understanding and denied any questions.  ______ 

## 2015-03-29 ENCOUNTER — Encounter: Payer: Self-pay | Admitting: Cardiology

## 2015-03-29 MED ORDER — DILTIAZEM HCL ER COATED BEADS 360 MG PO CP24: 360.0000 mg | ORAL_CAPSULE | Freq: Every day | ORAL | Status: DC

## 2015-03-29 NOTE — Telephone Encounter (Signed)
Erica [name],     Please increase Cardizem CD to 360 mg po daily and schedule a 24 hour Holter.    Thank you,    [place] Siren    Notified the pt that per Dr Meda Coffee, she reviewed her complaints of palpitations and fast HR from her mychart message she sent, and she recommends that we increase her cardizem CD to 360 mg po daily and schedule a 24 hour holter monitor to further evaluate.  Confirmed the pharmacy of choice with the pt.  Informed the pt that I will place the order for her 24 hour holter into the system and send a message to our [facility] schedulers to call her back to have this appt scheduled.  Pt verbalized understanding and agrees with this plan.

## 2015-03-30 NOTE — Telephone Encounter (Signed)
Pts holter monitor scheduled for 04/04/15.  Pt made aware of this appt.

## 2015-04-04 ENCOUNTER — Ambulatory Visit (INDEPENDENT_AMBULATORY_CARE_PROVIDER_SITE_OTHER): Payer: Medicare Other

## 2015-04-04 ENCOUNTER — Encounter: Payer: Self-pay | Admitting: Internal Medicine

## 2015-04-04 ENCOUNTER — Ambulatory Visit (HOSPITAL_COMMUNITY)
Admission: RE | Admit: 2015-04-04 | Discharge: 2015-04-04 | Disposition: A | Discharge status: Home / Self Care | Payer: Medicare Other | Source: Ambulatory Visit | Attending: Internal Medicine | Admitting: Internal Medicine

## 2015-04-04 DIAGNOSIS — R002 Palpitations: Secondary | ICD-10-CM

## 2015-04-04 DIAGNOSIS — R000 Tachycardia, unspecified: Secondary | ICD-10-CM

## 2015-04-04 DIAGNOSIS — J45991 Cough variant asthma: Secondary | ICD-10-CM

## 2015-04-04 LAB — PULMONARY FUNCTION TEST
FEF 25-75 POST: 1.58 L/s
FEF 25-75 PRE: 1.4 L/s
FEF2575-%CHANGE-POST: 13 %
FEF2575-%PRED-POST: 55 %
FEF2575-%PRED-PRE: 49 %
FEV1-%CHANGE-POST: 3 %
FEV1-%PRED-POST: 68 %
FEV1-%Pred-Pre: 65 %
FEV1-PRE: 1.71 L
FEV1-Post: 1.77 L
FEV1FVC-%CHANGE-POST: 2 %
FEV1FVC-%PRED-PRE: 91 %
FEV6-%Change-Post: 1 %
FEV6-%PRED-POST: 73 %
FEV6-%Pred-Pre: 72 %
FEV6-PRE: 2.24 L
FEV6-Post: 2.26 L
FEV6FVC-%Pred-Post: 102 %
FEV6FVC-%Pred-Pre: 102 %
FVC-%Change-Post: 1 %
FVC-%Pred-Post: 71 %
FVC-%Pred-Pre: 70 %
FVC-PRE: 2.24 L
FVC-Post: 2.26 L
POST FEV1/FVC RATIO: 78 %
Post FEV6/FVC ratio: 100 %
Pre FEV1/FVC ratio: 76 %
Pre FEV6/FVC Ratio: 100 %

## 2015-04-04 MED ORDER — METHACHOLINE 1 MG/ML NEB SOLN
2.0000 mL | Freq: Once | RESPIRATORY_TRACT | Status: AC
  Administered 2015-04-04: 2 mg via RESPIRATORY_TRACT

## 2015-04-04 MED ORDER — METHACHOLINE 0.0625 MG/ML NEB SOLN
2.0000 mL | Freq: Once | RESPIRATORY_TRACT | Status: AC
  Administered 2015-04-04: 0.125 mg via RESPIRATORY_TRACT

## 2015-04-04 MED ORDER — METHACHOLINE 16 MG/ML NEB SOLN: 2.0000 mL | Freq: Once | RESPIRATORY_TRACT | Status: DC

## 2015-04-04 MED ORDER — METHACHOLINE 4 MG/ML NEB SOLN: 2.0000 mL | Freq: Once | RESPIRATORY_TRACT | Status: DC

## 2015-04-04 MED ORDER — METHACHOLINE 0.25 MG/ML NEB SOLN
2.0000 mL | Freq: Once | RESPIRATORY_TRACT | Status: AC
  Administered 2015-04-04: 0.5 mg via RESPIRATORY_TRACT

## 2015-04-04 MED ORDER — ALBUTEROL SULFATE (2.5 MG/3ML) 0.083% IN NEBU
2.5000 mg | INHALATION_SOLUTION | Freq: Once | RESPIRATORY_TRACT | Status: AC
  Administered 2015-04-04: 2.5 mg via RESPIRATORY_TRACT

## 2015-04-04 MED ORDER — SODIUM CHLORIDE 0.9 % IN NEBU
3.0000 mL | INHALATION_SOLUTION | Freq: Once | RESPIRATORY_TRACT | Status: AC
  Administered 2015-04-04: 3 mL via RESPIRATORY_TRACT

## 2015-04-05 ENCOUNTER — Other Ambulatory Visit: Payer: Self-pay | Admitting: Internal Medicine

## 2015-04-05 DIAGNOSIS — F419 Anxiety disorder, unspecified: Secondary | ICD-10-CM

## 2015-04-05 DIAGNOSIS — F5105 Insomnia due to other mental disorder: Secondary | ICD-10-CM

## 2015-04-05 DIAGNOSIS — R000 Tachycardia, unspecified: Secondary | ICD-10-CM | POA: Diagnosis not present

## 2015-04-05 DIAGNOSIS — R002 Palpitations: Secondary | ICD-10-CM | POA: Diagnosis not present

## 2015-04-05 DIAGNOSIS — G9340 Encephalopathy, unspecified: Secondary | ICD-10-CM

## 2015-04-05 HISTORY — DX: Anxiety disorder, unspecified: F41.9

## 2015-04-05 MED ORDER — SUVOREXANT 15 MG PO TABS: 1.0000 | ORAL_TABLET | Freq: Every evening | ORAL | Status: DC | PRN

## 2015-04-06 ENCOUNTER — Other Ambulatory Visit: Payer: Self-pay | Admitting: Oncology

## 2015-04-06 ENCOUNTER — Ambulatory Visit (HOSPITAL_BASED_OUTPATIENT_CLINIC_OR_DEPARTMENT_OTHER): Payer: Medicare Other

## 2015-04-06 ENCOUNTER — Telehealth: Payer: Self-pay

## 2015-04-06 ENCOUNTER — Other Ambulatory Visit: Payer: Self-pay | Admitting: Cardiology

## 2015-04-06 VITALS — BP 192/100 | HR 101 | Temp 99.5°F

## 2015-04-06 DIAGNOSIS — C50412 Malignant neoplasm of upper-outer quadrant of left female breast: Secondary | ICD-10-CM

## 2015-04-06 DIAGNOSIS — Z5111 Encounter for antineoplastic chemotherapy: Secondary | ICD-10-CM

## 2015-04-06 MED ORDER — GOSERELIN ACETATE 3.6 MG ~~LOC~~ IMPL
3.6000 mg | DRUG_IMPLANT | Freq: Once | SUBCUTANEOUS | Status: AC
  Administered 2015-04-06: 3.6 mg via SUBCUTANEOUS
  Filled 2015-04-06: qty 3.6

## 2015-04-06 NOTE — Progress Notes (Signed)
Erica [name] here for Zoladex injection.  Checked BP manuely  192/100- 101.  She is going to cardiologist office after this appointment to get heart monitor removed.  Told her to have the nurse there to check her BP.  Dr [name] made aware of this.

## 2015-04-06 NOTE — Telephone Encounter (Signed)
PA initiated via covermymeds. Key for medication is [id number]

## 2015-04-07 ENCOUNTER — Telehealth: Payer: Self-pay | Admitting: *Deleted

## 2015-04-07 ENCOUNTER — Other Ambulatory Visit: Payer: Self-pay | Admitting: Internal Medicine

## 2015-04-07 MED ORDER — DILTIAZEM HCL ER COATED BEADS 420 MG PO TB24: 420.0000 mg | ORAL_TABLET | Freq: Every day | ORAL | Status: DC

## 2015-04-07 NOTE — Telephone Encounter (Signed)
-----   Message from [name], MD sent at 04/07/2015  1:20 PM EST -----   ----- Message -----    From: [name], MD    Sent: 04/07/2015   1:20 PM      To: [name], MD

## 2015-04-07 NOTE — Telephone Encounter (Signed)
   Notified the pt that per Dr Meda Coffee her 24 hour holter monitor results showed Sinus rhythm to sinus tachycardia.  Inappropriate sinus tachycardia.  Frequent PVCs.  Increase diltiazem to 480 mg po daily. Stay hydrated.  Clarification obtained and Dr Meda Coffee would like the pt to take long acting cardizem 420 mg po daily and stay hydrated.  Confirmed the pharmacy of choice with the pt.  Pt verbalized understanding and agrees with this plan.

## 2015-04-11 NOTE — Telephone Encounter (Signed)
Medication belsomra was approved. Approved from 01/08/2015-04/07/2016. Patient and pharmacy notified.

## 2015-04-25 ENCOUNTER — Telehealth: Payer: Self-pay | Admitting: Adult Health

## 2015-04-25 NOTE — Telephone Encounter (Signed)
LVM for patient to verify if she is currently using her albuterol neb treatments.

## 2015-04-26 NOTE — Telephone Encounter (Signed)
Called and spoke with patient. She states she uses her albuterol neb as needed through out the day. I explained to the patient I was calling to confirm she was using the med and that she needed refills. Pt voiced understanding and had no further questions. Nothing further needed at this time.

## 2015-05-04 ENCOUNTER — Ambulatory Visit: Payer: Medicare Other

## 2015-05-06 ENCOUNTER — Encounter: Payer: Self-pay | Admitting: Oncology

## 2015-05-09 ENCOUNTER — Ambulatory Visit (INDEPENDENT_AMBULATORY_CARE_PROVIDER_SITE_OTHER): Payer: Medicare Other | Admitting: Internal Medicine

## 2015-05-09 ENCOUNTER — Encounter: Payer: Self-pay | Admitting: Internal Medicine

## 2015-05-09 ENCOUNTER — Other Ambulatory Visit (INDEPENDENT_AMBULATORY_CARE_PROVIDER_SITE_OTHER): Payer: Medicare Other

## 2015-05-09 VITALS — BP 138/94 | HR 80 | Temp 99.2°F | Resp 16 | Ht 66.0 in | Wt 201.0 lb

## 2015-05-09 DIAGNOSIS — I10 Essential (primary) hypertension: Secondary | ICD-10-CM

## 2015-05-09 DIAGNOSIS — F5105 Insomnia due to other mental disorder: Secondary | ICD-10-CM

## 2015-05-09 DIAGNOSIS — F419 Anxiety disorder, unspecified: Secondary | ICD-10-CM | POA: Diagnosis not present

## 2015-05-09 DIAGNOSIS — I5032 Chronic diastolic (congestive) heart failure: Secondary | ICD-10-CM | POA: Diagnosis not present

## 2015-05-09 DIAGNOSIS — L309 Dermatitis, unspecified: Secondary | ICD-10-CM

## 2015-05-09 LAB — CBC WITH DIFFERENTIAL/PLATELET
BASOS PCT: 0.8 % (ref 0.0–3.0)
Basophils Absolute: 0.1 10*3/uL (ref 0.0–0.1)
EOS PCT: 1.2 % (ref 0.0–5.0)
Eosinophils Absolute: 0.1 10*3/uL (ref 0.0–0.7)
HEMATOCRIT: 43.8 % (ref 36.0–46.0)
HEMOGLOBIN: 14.5 g/dL (ref 12.0–15.0)
LYMPHS PCT: 28.3 % (ref 12.0–46.0)
Lymphs Abs: 2 10*3/uL (ref 0.7–4.0)
MCHC: 33.1 g/dL (ref 30.0–36.0)
MCV: 88.3 fl (ref 78.0–100.0)
Monocytes Absolute: 0.5 10*3/uL (ref 0.1–1.0)
Monocytes Relative: 6.8 % (ref 3.0–12.0)
Neutro Abs: 4.4 10*3/uL (ref 1.4–7.7)
Neutrophils Relative %: 62.9 % (ref 43.0–77.0)
Platelets: 257 10*3/uL (ref 150.0–400.0)
RBC: 4.96 Mil/uL (ref 3.87–5.11)
RDW: 14.8 % (ref 11.5–15.5)
WBC: 7.1 10*3/uL (ref 4.0–10.5)

## 2015-05-09 LAB — BASIC METABOLIC PANEL
BUN: 9 mg/dL (ref 6–23)
CHLORIDE: 101 meq/L (ref 96–112)
CO2: 32 mEq/L (ref 19–32)
Calcium: 9.5 mg/dL (ref 8.4–10.5)
Creatinine, Ser: 0.97 mg/dL (ref 0.40–1.20)
GFR: 81.14 mL/min (ref >60.00)
Glucose, Bld: 96 mg/dL (ref 70–99)
POTASSIUM: 3.4 meq/L — AB (ref 3.5–5.1)
Sodium: 139 mEq/L (ref 135–145)

## 2015-05-09 LAB — MAGNESIUM: MAGNESIUM: 2.1 mg/dL (ref 1.5–2.5)

## 2015-05-09 MED ORDER — ESZOPICLONE 3 MG PO TABS: 3.0000 mg | ORAL_TABLET | Freq: Every day | ORAL | Status: DC

## 2015-05-09 MED ORDER — BYSTOLIC 10 MG PO TABS: 10.0000 mg | ORAL_TABLET | Freq: Every day | ORAL | Status: DC

## 2015-05-09 MED ORDER — FLUOCINONIDE-E 0.05 % EX CREA: 1.0000 "application " | TOPICAL_CREAM | Freq: Two times a day (BID) | CUTANEOUS | Status: DC

## 2015-05-09 NOTE — Progress Notes (Signed)
Subjective:  Patient ID: Erica [name], female    DOB: 02/13/1976  Age: 40 y.o. MRN: [id number]  CC: Rash and Hypertension   HPI Erica [name] presents for a recurrent itchy rash on her right hand. This is been previously diagnosed as hand eczema and she has tried triamcinolone cream. She does not feel like it's responding very well to that. She also needs a blood pressure check, she tells me her blood pressure at home has been well controlled. She is confused about her blood pressure regimen and it appears that she might be taking 3 different diuretics. She also thinks that she might not have been taking the Bystolic recently  Outpatient Prescriptions Prior to Visit  Medication Sig Dispense Refill  . acetaZOLAMIDE (DIAMOX) 250 MG tablet Take 4 tablets (1,000 mg total) by mouth 3 (three) times daily. (Patient taking differently: Take 4 tablets by mouth four times a day) 400 tablet 5  . albuterol (PROVENTIL HFA;VENTOLIN HFA) 108 (90 BASE) MCG/ACT inhaler Inhale 2 puffs into the lungs every 4 (four) hours as needed for wheezing or shortness of breath (((PLAN A))). (Patient taking differently: Inhale 2 puffs into the lungs every 4 (four) hours as needed for wheezing or shortness of breath (((PLAN B))). ) 18 g 5  . albuterol (PROVENTIL) (2.5 MG/3ML) 0.083% nebulizer solution Take 3 mLs (2.5 mg total) by nebulization every 4 (four) hours as needed for wheezing or shortness of breath (((PLAN B))). (Patient taking differently: Take 2.5 mg by nebulization every 4 (four) hours as needed for wheezing or shortness of breath (((PLAN C))). ) 300 mL 5  . Biotin 10 MG TABS Take 1 tablet by mouth every morning.     . chlorpheniramine (CHLOR-TRIMETON) 4 MG tablet Take 8 mg by mouth at bedtime.    . cloNIDine (CATAPRES) 0.3 MG tablet Take 1 tablet by mouth 2 (two) times daily.  2  . dexlansoprazole (DEXILANT) 60 MG capsule Take 60 mg by mouth daily.    Marland Kitchen dextromethorphan-guaiFENesin (MUCINEX DM) 30-600 MG per 12  hr tablet Take 2 tablets by mouth 2 (two) times daily as needed (for cough, congestion, and thick mucus).     Marland Kitchen diltiazem (CARDIZEM LA) 420 MG 24 hr tablet Take 1 tablet (420 mg total) by mouth daily. 90 tablet 3  . EPINEPHrine 0.3 mg/0.3 mL IJ SOAJ injection Inject 0.3 mLs (0.3 mg total) into the muscle once. (Patient taking differently: Use as directed for allergic reaction) 2 Device 1  . hydrALAZINE (APRESOLINE) 100 MG tablet Take 1 tablet (100 mg total) by mouth 3 (three) times daily. 90 tablet 11  . hydrochlorothiazide (MICROZIDE) 12.5 MG capsule Take 1 capsule (12.5 mg total) by mouth daily. 90 capsule 1  . hyoscyamine (LEVSIN SL) 0.125 MG SL tablet Place 1 tablet (0.125 mg total) under the tongue every 8 (eight) hours as needed for cramping. 30 tablet 2  . isosorbide mononitrate (IMDUR) 60 MG 24 hr tablet Take 1 tablet (60 mg total) by mouth daily. 30 tablet 11  . LORazepam (ATIVAN) 0.5 MG tablet Take 1 tablet (0.5 mg total) by mouth every 8 (eight) hours as needed. for anxiety 90 tablet 0  . mometasone (NASONEX) 50 MCG/ACT nasal spray Place 2 sprays into the nose 2 (two) times daily.    . montelukast (SINGULAIR) 10 MG tablet TAKE 1 TABLET BY MOUTH EVERY NIGHT AT BEDTIME 90 tablet 1  . nitroGLYCERIN (NITROSTAT) 0.4 MG SL tablet Place 1 tablet (0.4 mg total) under the  tongue every 5 (five) minutes as needed for chest pain. 90 tablet 3  . potassium chloride SA (K-DUR,KLOR-CON) 20 MEQ tablet Take 20 mEq by mouth 3 (three) times daily.    . pregabalin (LYRICA) 200 MG capsule Take 1 capsule (200 mg total) by mouth 3 (three) times daily. 90 capsule 5  . primidone (MYSOLINE) 50 MG tablet Take 0.5 tablets (25 mg total) by mouth at bedtime. 30 tablet 5  . Respiratory Therapy Supplies (FLUTTER) DEVI Use as directed 1 each 0  . spironolactone (ALDACTONE) 25 MG tablet Take 1 tablet by mouth every morning and 2 tablets by mouth at bedtime    . sucralfate (CARAFATE) 1 G tablet Take 1 g by mouth 4 (four)  times daily.    . SYMBICORT 160-4.5 MCG/ACT inhaler INHALE 2 PUFFS BY MOUTH TWICE DAILY 10.2 g 5  . traMADol (ULTRAM) 50 MG tablet 1-2 every 4 hours as needed for cough or pain 40 tablet 0  . BYSTOLIC 10 MG tablet Take 1 tablet by mouth daily.  3  . furosemide (LASIX) 40 MG tablet Take 40 mg by mouth daily as needed for fluid.     . furosemide (LASIX) 40 MG tablet TAKE 1 TABLET BY MOUTH DAILY AS NEEDED 30 tablet 9  . ibuprofen (ADVIL,MOTRIN) 800 MG tablet Take 1 tablet (800 mg total) by mouth 3 (three) times daily. 21 tablet 0  . metoCLOPramide (REGLAN) 10 MG tablet TAKE 1 TABLET BY MOUTH FOUR TIMES DAILY WITH MEALS AND AT BEDTIME 120 tablet 0  . montelukast (SINGULAIR) 10 MG tablet Take 1 tablet (10 mg total) by mouth at bedtime. 30 tablet 5  . ondansetron (ZOFRAN-ODT) 4 MG disintegrating tablet Take 1 tablet (4 mg total) by mouth every 6 (six) hours as needed for nausea or vomiting. (Patient taking differently: Take 4 mg by mouth every 6 (six) hours as needed for nausea. ) 20 tablet 5  . Suvorexant (BELSOMRA) 15 MG TABS Take 1 tablet by mouth at bedtime as needed. 30 tablet 5  . triamcinolone cream (KENALOG) 0.5 % Apply 1 application topically 3 (three) times daily. 30 g 2   Facility-Administered Medications Prior to Visit  Medication Dose Route Frequency Provider Last Rate Last Dose  . goserelin (ZOLADEX) injection 3.6 mg  3.6 mg Subcutaneous Once Chauncey Cruel, MD        ROS Review of Systems  Constitutional: Negative.  Negative for fever, chills, diaphoresis, appetite change and fatigue.  HENT: Negative.   Eyes: Negative.  Negative for visual disturbance.  Respiratory: Negative.  Negative for cough, choking, chest tightness, shortness of breath and stridor.   Cardiovascular: Negative.  Negative for chest pain, palpitations and leg swelling.  Gastrointestinal: Negative.  Negative for nausea, vomiting, abdominal pain, diarrhea and constipation.  Endocrine: Negative.   Genitourinary:  Negative.   Musculoskeletal: Negative.  Negative for myalgias, back pain and arthralgias.  Skin: Positive for rash. Negative for color change.  Allergic/Immunologic: Negative.   Neurological: Negative.  Negative for dizziness, weakness and headaches.  Hematological: Negative.  Negative for adenopathy. Does not bruise/bleed easily.  Psychiatric/Behavioral: Positive for sleep disturbance.       She complains of persistent insomnia with difficulty falling asleep and frequent awakenings, she does not feel like Belsmra has helped her symptoms    Objective:  BP 138/94 mmHg  Pulse 80  Temp(Src) 99.2 F (37.3 C) (Oral)  Resp 16  Ht 5\' 6"  (1.676 m)  Wt 201 lb (91.173 kg)  BMI 32.46  kg/m2  SpO2 98%  BP Readings from Last 3 Encounters:  05/09/15 138/94  04/06/15 192/100  03/24/15 148/92    Wt Readings from Last 3 Encounters:  05/09/15 201 lb (91.173 kg)  03/24/15 198 lb 6.4 oz (89.994 kg)  02/21/15 197 lb (89.359 kg)    Physical Exam  Constitutional: She is oriented to person, place, and time. She appears well-developed and well-nourished. No distress.  HENT:  Mouth/Throat: Oropharynx is clear and moist. No oropharyngeal exudate.  Eyes: Conjunctivae are normal. Right eye exhibits no discharge. Left eye exhibits no discharge. No scleral icterus.  Neck: Normal range of motion. Neck supple. No JVD present. No tracheal deviation present. No thyromegaly present.  Cardiovascular: Normal rate, regular rhythm, normal heart sounds and intact distal pulses.  Exam reveals no gallop and no friction rub.   No murmur heard. Pulmonary/Chest: Effort normal and breath sounds normal. No stridor. No respiratory distress. She has no wheezes. She has no rales. She exhibits no tenderness.  Abdominal: Soft. Bowel sounds are normal. She exhibits no distension and no mass. There is no tenderness. There is no rebound and no guarding.  Musculoskeletal: Normal range of motion. She exhibits no edema or  tenderness.  Lymphadenopathy:    She has no cervical adenopathy.  Neurological: She is oriented to person, place, and time.  Skin: Skin is warm and dry. Rash noted. No purpura noted. Rash is papular. Rash is not macular, not maculopapular, not nodular, not pustular, not vesicular and not urticarial. She is not diaphoretic. No erythema. No pallor.  Palmar side of right hand over the thenar eminence shows a patch of papules and scale  Psychiatric: She has a normal mood and affect. Her behavior is normal. Judgment and thought content normal.  Vitals reviewed.   Lab Results  Component Value Date   WBC 7.1 05/09/2015   HGB 14.5 05/09/2015   HCT 43.8 05/09/2015   PLT 257.0 05/09/2015   GLUCOSE 96 05/09/2015   CHOL 172 02/03/2014   TRIG 151.0* 02/03/2014   HDL 32.00* 02/03/2014   LDLCALC 110* 02/03/2014   ALT 27 09/20/2014   AST 28 09/20/2014   NA 139 05/09/2015   K 3.4* 05/09/2015   CL 101 05/09/2015   CREATININE 0.97 05/09/2015   BUN 9 05/09/2015   CO2 32 05/09/2015   TSH 0.570 09/20/2014   INR 1.1* 06/03/2012   HGBA1C 5.7 11/08/2014    No results found.  Assessment & Plan:   Carola was seen today for rash and hypertension.  Diagnoses and all orders for this visit:  Eczema of right hand- will try a higher potency topical steroid for this -     fluocinonide-emollient (LIDEX-E) 0.05 % cream; Apply 1 application topically 2 (two) times daily.  Insomnia secondary to anxiety- she will discontinue the Belsomra and will try Lunesta for insomnia. -     Eszopiclone 3 MG TABS; Take 1 tablet (3 mg total) by mouth at bedtime. Take immediately before bedtime  Essential hypertension- her blood pressure is not adequately well controlled, I don't think there is any clinical benefit from her being on 3 different diuretics I have asked her to stop taking furosemide, to get better control of her blood pressure will restart by systolic, she will continue the other diuretics for hypertension  and pseudotumor cerebri, her electrolytes and renal function are stable -     BYSTOLIC 10 MG tablet; Take 1 tablet (10 mg total) by mouth daily. -     Magnesium;  Future -     Basic metabolic panel; Future -     CBC with Differential/Platelet; Future  Chronic diastolic CHF (congestive heart failure) ([place])- she is euvolemic today, will try to get better control of her blood pressure with the addition of Bystolic -     BYSTOLIC 10 MG tablet; Take 1 tablet (10 mg total) by mouth daily. -     Basic metabolic panel; Future   I have discontinued Ms. Pines's ondansetron, furosemide, metoCLOPramide, triamcinolone cream, ibuprofen, Suvorexant, and furosemide. I have also changed her BYSTOLIC. Additionally, I am having her start on fluocinonide-emollient and Eszopiclone. Lastly, I am having her maintain her nitroGLYCERIN, albuterol, EPINEPHrine, isosorbide mononitrate, Biotin, acetaZOLAMIDE, albuterol, sucralfate, dextromethorphan-guaiFENesin, potassium chloride SA, chlorpheniramine, hydrALAZINE, mometasone, spironolactone, dexlansoprazole, LORazepam, SYMBICORT, primidone, pregabalin, hydrochlorothiazide, hyoscyamine, cloNIDine, traMADol, FLUTTER, montelukast, and diltiazem.  Meds ordered this encounter  Medications  . BYSTOLIC 10 MG tablet    Sig: Take 1 tablet (10 mg total) by mouth daily.    Dispense:  90 tablet    Refill:  3  . fluocinonide-emollient (LIDEX-E) 0.05 % cream    Sig: Apply 1 application topically 2 (two) times daily.    Dispense:  60 g    Refill:  1  . Eszopiclone 3 MG TABS    Sig: Take 1 tablet (3 mg total) by mouth at bedtime. Take immediately before bedtime    Dispense:  30 tablet    Refill:  5     Follow-up: Return in about 3 months (around 08/06/2015).  Scarlette Calico, MD

## 2015-05-09 NOTE — Patient Instructions (Signed)
Eczema Eczema, also called atopic dermatitis, is a skin disorder that causes inflammation of the skin. It causes a red rash and dry, scaly skin. The skin becomes very itchy. Eczema is generally worse during the cooler winter months and often improves with the warmth of summer. Eczema usually starts showing signs in infancy. Some children outgrow eczema, but it may last through adulthood.  CAUSES  The exact cause of eczema is not known, but it appears to run in families. People with eczema often have a family history of eczema, allergies, asthma, or hay fever. Eczema is not contagious. Flare-ups of the condition may be caused by:   Contact with something you are sensitive or allergic to.   Stress. SIGNS AND SYMPTOMS  Dry, scaly skin.   Red, itchy rash.   Itchiness. This may occur before the skin rash and may be very intense.  DIAGNOSIS  The diagnosis of eczema is usually made based on symptoms and medical history. TREATMENT  Eczema cannot be cured, but symptoms usually can be controlled with treatment and other strategies. A treatment plan might include:  Controlling the itching and scratching.   Use over-the-counter antihistamines as directed for itching. This is especially useful at night when the itching tends to be worse.   Use over-the-counter steroid creams as directed for itching.   Avoid scratching. Scratching makes the rash and itching worse. It may also result in a skin infection (impetigo) due to a break in the skin caused by scratching.   Keeping the skin well moisturized with creams every day. This will seal in moisture and help prevent dryness. Lotions that contain alcohol and water should be avoided because they can dry the skin.   Limiting exposure to things that you are sensitive or allergic to (allergens).   Recognizing situations that cause stress.   Developing a plan to manage stress.  HOME CARE INSTRUCTIONS   Only take over-the-counter or  prescription medicines as directed by your health care provider.   Do not use anything on the skin without checking with your health care provider.   Keep baths or showers short (5 minutes) in warm (not hot) water. Use mild cleansers for bathing. These should be unscented. You may add nonperfumed bath oil to the bath water. It is best to avoid soap and bubble bath.   Immediately after a bath or shower, when the skin is still damp, apply a moisturizing ointment to the entire body. This ointment should be a petroleum ointment. This will seal in moisture and help prevent dryness. The thicker the ointment, the better. These should be unscented.   Keep fingernails cut short. Children with eczema may need to wear soft gloves or mittens at night after applying an ointment.   Dress in clothes made of cotton or cotton blends. Dress lightly, because heat increases itching.   A child with eczema should stay away from anyone with fever blisters or cold sores. The virus that causes fever blisters (herpes simplex) can cause a serious skin infection in children with eczema. SEEK MEDICAL CARE IF:   Your itching interferes with sleep.   Your rash gets worse or is not better within 1 week after starting treatment.   You see pus or soft yellow scabs in the rash area.   You have a fever.   You have a rash flare-up after contact with someone who has fever blisters.    This information is not intended to replace advice given to you by your health care   provider. Make sure you discuss any questions you have with your health care provider.   Document Released: 03/15/2000 Document Revised: 01/06/2013 Document Reviewed: 10/19/2012 Elsevier Interactive Patient Education 2016 [employer]  

## 2015-05-09 NOTE — Progress Notes (Signed)
Pre visit review using our clinic review tool, if applicable. No additional management support is needed unless otherwise documented below in the visit note. 

## 2015-05-10 ENCOUNTER — Other Ambulatory Visit: Payer: Self-pay | Admitting: Internal Medicine

## 2015-05-10 ENCOUNTER — Telehealth: Payer: Self-pay | Admitting: Oncology

## 2015-05-10 ENCOUNTER — Other Ambulatory Visit: Payer: Self-pay | Admitting: Adult Health

## 2015-05-10 ENCOUNTER — Other Ambulatory Visit: Payer: Self-pay | Admitting: Oncology

## 2015-05-10 ENCOUNTER — Other Ambulatory Visit: Payer: Self-pay | Admitting: *Deleted

## 2015-05-10 ENCOUNTER — Encounter: Payer: Self-pay | Admitting: Internal Medicine

## 2015-05-10 NOTE — Telephone Encounter (Signed)
MW please advise on refill. Thanks. 

## 2015-05-10 NOTE — Telephone Encounter (Signed)
R/s missed injection appointment 2/2 for 2/9. Patient aware

## 2015-05-11 ENCOUNTER — Telehealth: Payer: Self-pay

## 2015-05-11 ENCOUNTER — Ambulatory Visit (HOSPITAL_BASED_OUTPATIENT_CLINIC_OR_DEPARTMENT_OTHER): Payer: Medicare Other

## 2015-05-11 VITALS — BP 174/97 | HR 105 | Temp 98.9°F

## 2015-05-11 DIAGNOSIS — Z5111 Encounter for antineoplastic chemotherapy: Secondary | ICD-10-CM

## 2015-05-11 DIAGNOSIS — C50412 Malignant neoplasm of upper-outer quadrant of left female breast: Secondary | ICD-10-CM

## 2015-05-11 MED ORDER — GOSERELIN ACETATE 3.6 MG ~~LOC~~ IMPL
3.6000 mg | DRUG_IMPLANT | Freq: Once | SUBCUTANEOUS | Status: AC
  Administered 2015-05-11: 3.6 mg via SUBCUTANEOUS
  Filled 2015-05-11: qty 3.6

## 2015-05-11 NOTE — Telephone Encounter (Signed)
PA initiated via [place]

## 2015-05-12 NOTE — Telephone Encounter (Signed)
PA Approved 02/10/2015-05/10/2016

## 2015-05-18 ENCOUNTER — Ambulatory Visit
Admission: RE | Admit: 2015-05-18 | Discharge: 2015-05-18 | Disposition: A | Discharge status: Home / Self Care | Payer: Medicare Other | Source: Ambulatory Visit | Attending: Neurology | Admitting: Neurology

## 2015-05-18 ENCOUNTER — Ambulatory Visit (INDEPENDENT_AMBULATORY_CARE_PROVIDER_SITE_OTHER): Payer: Medicare Other | Admitting: Neurology

## 2015-05-18 ENCOUNTER — Encounter: Payer: Self-pay | Admitting: Neurology

## 2015-05-18 VITALS — BP 160/100 | HR 104 | Wt 198.4 lb

## 2015-05-18 DIAGNOSIS — G932 Benign intracranial hypertension: Secondary | ICD-10-CM

## 2015-05-18 DIAGNOSIS — R292 Abnormal reflex: Secondary | ICD-10-CM

## 2015-05-18 DIAGNOSIS — Z853 Personal history of malignant neoplasm of breast: Secondary | ICD-10-CM

## 2015-05-18 DIAGNOSIS — R531 Weakness: Secondary | ICD-10-CM

## 2015-05-18 DIAGNOSIS — M6289 Other specified disorders of muscle: Secondary | ICD-10-CM | POA: Diagnosis not present

## 2015-05-18 DIAGNOSIS — R51 Headache: Secondary | ICD-10-CM | POA: Diagnosis not present

## 2015-05-18 MED ORDER — ACETAZOLAMIDE 250 MG PO TABS: 1000.0000 mg | ORAL_TABLET | Freq: Three times a day (TID) | ORAL | Status: DC

## 2015-05-18 MED ORDER — TOPIRAMATE 25 MG PO TABS: ORAL_TABLET | ORAL | Status: DC

## 2015-05-18 MED ORDER — DIAZEPAM 5 MG PO TABS: ORAL_TABLET | ORAL | Status: DC

## 2015-05-18 MED ORDER — GADOBENATE DIMEGLUMINE 529 MG/ML IV SOLN
19.0000 mL | Freq: Once | INTRAVENOUS | Status: AC | PRN
  Administered 2015-05-18: 19 mL via INTRAVENOUS

## 2015-05-18 NOTE — Progress Notes (Signed)
Follow-up Visit   Date: 05/18/2015    Erica [name] MRN: [id number] DOB: 1975/05/05   Interim History: Erica [name] is a 40 y.o. 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    History of present illness: Starting in 2010 after her second course of chemotherapy she developed numbness/tingling of the arm and legs, described as her hands are asleep. She has burning sensation and shaking of her hands. Activity worsens pain and she has difficulty with fine motor tasks such as buttoning, tying shoe laces, and braiding her daughter's hair. She falls about once per month because her legs give out. She was started on Lyrica 50mg  TID around March which has helped slightly.   She history of pseudotumor cerebri, followed by ophthalmology, and takes diamox 500mg  BID. 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 She had lumbar puncture performed in the seated position with documented OP of 43 and 32cc of fluid removed. She had a second lumbar puncture in 2014 at which time OP was 18 mmHg.   She continues to have 1-2 elective high volume taps annually for headaches.    - UPDATE 03/03/2014:  Doing better after increasing Lyrica to 200mg  TID and lidocaine ointment.  She reports less shaking of her hand and reports greater than 50% improvement of pain with lidocaine.  She reports having new spells of syncope, which seems to only occur when she is sitting. There is loss of consciousness, lasting only a few seconds.  Following the spells, there is no fatigue, confusion, or weakness. On one  ocassion, she felt herself leaning to the side and caught herself when at therapy. She is unaware of the spells.   - UPDATE 08/19/2014:  I received a call from her ophthalmologist, Dr. Vista Mink with new findings of severe left temporal visual field deficit involving her left eye (good eye).  My office set up urgent CT/A which returned normal and arranged for MRI brain to be done the following week. Patient cancelled her MRI scheduled for 5/9 and rescheduled it to 5/25.  She underwent LP with OP of 27cm of water.  Since her LP, she reports feeling as if her vision is better and eyes don't feel as foggy anymore.   Denies headaches, worsening of vision, or weakness.  Her potassium levels have been persistently low despite taking supplements and she was recently started on spironolactone.   - UPDATE 10/21/2014:  She states that her peripheral vision on the left has somewhat improved.  Headaches are better.  She was evaluated by neurosurgery for shunt evaluation, but because of no papilledema, did not feel that she would be a good candidate unless symptoms changes or worsened.  Unfortunately, she had an interval hospitalization for acute mental status change and was found to have AKI, etiology unclear.  - UPDATE 01/26/2015:  She feels that her CSF pressure is elevated because she has right facial pressure and has to focus with her vision more. Denies double vision. She has an eye evaluation in November. Her blood pressure has remained elevated and she was started on a new antihypertensive medication.    - UPDATE 05/18/2015:  She underwent LP in November which showed opening pressure of  27 with 22cc removed and CP 12.  She was doing well until last week, when she again began noticing pressure behind her right eye and she feels that she needs another tap. No new vision problems, She did not have her eye evaluation because of a change in her insurance.  She is tearful today and frustrated with all of her medical  problems.  Her blood pressure was also elevated today in the 190s, rechecked it reduced to 160s.    Medications:  Current Outpatient Prescriptions on File Prior to Visit  Medication Sig Dispense Refill  . albuterol (PROVENTIL HFA;VENTOLIN HFA) 108 (90 BASE) MCG/ACT inhaler Inhale 2 puffs into the lungs every 4 (four) hours as needed for wheezing or shortness of breath (((PLAN A))). (Patient taking differently: Inhale 2 puffs into the lungs every 4 (four) hours as needed for wheezing or shortness of breath (((PLAN B))). ) 18 g 5  . albuterol (PROVENTIL) (2.5 MG/3ML) 0.083% nebulizer solution INHALE 1 VIAL VIA NEBULIZER EVERY 4 HOURS AS NEEDED FOR WHEEZING OR SHORTNESS OF BREATH 300 mL 0  . Biotin 10 MG TABS Take 1 tablet by mouth every morning.     Marland Kitchen BYSTOLIC 10 MG tablet Take 1 tablet (10 mg total) by mouth daily. 90 tablet 3  . chlorpheniramine (CHLOR-TRIMETON) 4 MG tablet Take 8 mg by mouth at bedtime.    . cloNIDine (CATAPRES) 0.3 MG tablet Take 1 tablet by mouth 2 (two) times daily.  2  . dexlansoprazole (DEXILANT) 60 MG capsule Take 60 mg by mouth daily.    Marland Kitchen dextromethorphan-guaiFENesin (MUCINEX DM) 30-600 MG per 12 hr tablet Take 2 tablets by mouth 2 (two) times daily as needed (for cough, congestion, and thick mucus).     Marland Kitchen diltiazem (CARDIZEM LA) 420 MG 24 hr tablet Take 1 tablet (420 mg total) by mouth daily. 90 tablet 3  . EPINEPHrine 0.3 mg/0.3 mL IJ SOAJ injection Inject 0.3 mLs (0.3 mg total) into the muscle once. (Patient taking differently: Use as directed for allergic reaction) 2 Device 1  . Eszopiclone 3 MG TABS Take 1 tablet (3 mg total) by mouth at bedtime. Take immediately before bedtime 30 tablet 5  . fluocinonide-emollient (LIDEX-E) 0.05 % cream Apply 1 application topically 2 (two) times daily. 60 g 1  . hydrALAZINE (APRESOLINE) 100 MG tablet Take 1 tablet (100 mg total) by mouth 3 (three) times daily. 90 tablet 11  . hydrochlorothiazide (MICROZIDE) 12.5 MG capsule TAKE 1  CAPSULE(12.5 MG) BY MOUTH DAILY 90 capsule 1  . hyoscyamine (LEVSIN SL) 0.125 MG SL tablet Place 1 tablet (0.125 mg total) under the tongue every 8 (eight) hours as needed for cramping. 30 tablet 2  . isosorbide mononitrate (IMDUR) 60 MG 24 hr tablet Take 1 tablet (60 mg total) by mouth daily. 30 tablet 11  . LORazepam (ATIVAN) 0.5 MG tablet TAKE 1 TABLET BY MOUTH EVERY 8 HOURS AS NEEDED FOR ANXIETY 90 tablet 0  . mometasone (NASONEX) 50 MCG/ACT nasal spray Place 2 sprays into the nose 2 (two) times daily.    . montelukast (SINGULAIR) 10 MG tablet TAKE 1 TABLET BY MOUTH EVERY NIGHT AT BEDTIME 90 tablet 1  . nitroGLYCERIN (NITROSTAT) 0.4 MG SL tablet Place 1 tablet (0.4 mg total) under the tongue every 5 (five) minutes as needed for chest pain. 90 tablet 3  . potassium chloride SA (K-DUR,KLOR-CON) 20 MEQ tablet Take 20 mEq by mouth 3 (three) times daily.    . pregabalin (LYRICA) 200 MG  capsule Take 1 capsule (200 mg total) by mouth 3 (three) times daily. 90 capsule 5  . primidone (MYSOLINE) 50 MG tablet Take 0.5 tablets (25 mg total) by mouth at bedtime. 30 tablet 5  . Respiratory Therapy Supplies (FLUTTER) DEVI Use as directed 1 each 0  . spironolactone (ALDACTONE) 25 MG tablet Take 1 tablet by mouth every morning and 2 tablets by mouth at bedtime    . SYMBICORT 160-4.5 MCG/ACT inhaler INHALE 2 PUFFS BY MOUTH TWICE DAILY 10.2 g 5  . traMADol (ULTRAM) 50 MG tablet TAKE 1 TO 2 TABLETS BY MOUTH EVERY 4 HOURS AS NEEDED FOR COUGH OR PAIN 40 tablet 0   Current Facility-Administered Medications on File Prior to Visit  Medication Dose Route Frequency Provider Last Rate Last Dose  . goserelin (ZOLADEX) injection 3.6 mg  3.6 mg Subcutaneous Once Chauncey Cruel, MD        Allergies:  Allergies  Allergen Reactions  . Aspirin Shortness Of Breath and Palpitations    Pt can take ibuprofen without reaction  . Doxycycline Anaphylaxis  . Penicillins Shortness Of Breath and Palpitations  . Kiwi Extract  Itching and Swelling    Lip swelling  . Strawberry Extract Hives     Review of Systems:  CONSTITUTIONAL: No fevers, chills, night sweats, or weight loss.   EYES: +visual changes or eye pain ENT: No hearing changes.  No history of nose bleeds.   RESPIRATORY: No cough, wheezing and shortness of breath.   CARDIOVASCULAR: Negative for chest pain, and palpitations.   GI: Negative for abdominal discomfort, blood in stools or black stools.  No recent change in bowel habits.   GU:  No history of incontinence.   MUSCLOSKELETAL: No history of joint pain or swelling.  No myalgias.   SKIN: Negative for lesions, rash, and itching.   ENDOCRINE: Negative for cold or heat intolerance, polydipsia or goiter.   PSYCH:  No depression or anxiety symptoms.   NEURO: As Above.   Vital Signs:  BP 160/100 mmHg  Pulse 104  Wt 198 lb 6 oz (89.982 kg)  SpO2 94%   Neurological Exam: MENTAL STATUS including orientation to time, place, person is normal.  Speech is not dysarthric.  CRANIAL NERVES:  No papilledema on fundoscopic exam, disc are sharp bilaterally, disc is pale on the right (old).  She is blind in the right eye - only able to see light.  Visual field testing of the left eye is shows reduced peripheral vision on the left temporal field.  Pupils equal round and reactive to light.  Normal conjugate, extra-ocular eye movements in all directions of gaze.  Face is symmetric. Palate elevates symmetrically.    MOTOR: Mild intention tremor bilaterally, especially when outstretched. No pronator drift.  Tone is normal.    Right Upper Extremity:    Left Upper Extremity:    Deltoid  5/5   Deltoid  5-/5   Biceps  5/5   Biceps  5-/5   Triceps  5/5   Triceps  5-/5   Wrist extensors  5/5   Wrist extensors  5-/5   Wrist flexors  5/5   Wrist flexors  5-/5   Finger extensors  5/5   Finger extensors  4+/5   Finger flexors  5/5   Finger flexors  4+/5   Dorsal interossei  5/5   Dorsal interossei  4+/5   Abductor  pollicis  5/5   Abductor pollicis  4+/5   Tone (Ashworth scale)  0  Tone (Ashworth scale)  0   Right Lower Extremity:    Left Lower Extremity:    Hip flexors  5/5   Hip flexors  4+/5   Hip extensors  5/5   Hip extensors  5-/5   Knee flexors  5/5   Knee flexors  5-/5   Knee extensors  5/5   Knee extensors  5-/5   Dorsiflexors  5/5   Dorsiflexors  4+/5   Plantarflexors  5/5   Plantarflexors  5-/5   Toe extensors  5/5   Toe extensors  5/5   Toe flexors  5/5   Toe flexors  4+/5   Tone (Ashworth scale)  0  Tone (Ashworth scale)  0     MSRs:  Right                                                                 Left brachioradialis 2+  brachioradialis 3+  biceps 2+  biceps 3+  triceps 2+  triceps 3+  patellar 2+  patellar 3+  ankle jerk 2+  ankle jerk 2+  Hoffman no  Hoffman no  plantar response down  plantar response down   SENSORY:  Intact to vibration and temperature throughout  COORDINATION/GAIT:   Gait narrow based, slow, slightly unsteady, unassisted.   Data: [facility], Dr. 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 CT/A head and neck 07/26/2014:  Unremarkable MRI brain wwo contrast 08/24/2014: 1. No acute intracranial abnormality.  2. Increased number of multiple subcortical T2 hyperintensities more prominent left than right. The finding is nonspecific but can be seen in the setting of chronic microvascular ischemia, a demyelinating process such as multiple sclerosis, vasculitis, complicated migraine headaches, or as the sequelae of a prior infectious or inflammatory process.  Lumbar puncture with OP 07/29/2014: Elevated opening pressure it 27 cm of water which was decreased to 16 cm of water.  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  Therefore other causes of optic neuropathy such as hypertension is more likely. She is not a candidate for VP shunt and was seen by Dr. Kathyrn Sheriff. She tends to have about 1-2 serial LPs annually. Continue diamox to 1000mg  TID, she is also taking lasix 40mg  and potassium supplements.  With her worsening headaches, start topiramate 25mg  twice daily.  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  Clinically stable. Symptoms manifesting with painful parsthesias of her hands and feet which started following chemotherapy with docetaxel/cyclophosphamide (2010). Continue Lyrica to 200mg  TID and lidocaine ointment to her feet.   Prescription provided for rollator  Benign essential tremors, monitor for now.   No betablocks given history of asthma  Syncopal spells, unclear etiology These episodes are very brief with quick return back to baseline.   Cardiac work-up is negative, EEG is negative Patient informed of [place] law of no driving for 88-months since last spell of impaired  consciousness  Resistant hypertension, asymptomatic Instructed to monitor BP at home and follow-up with PCP   Return to clinic in 2 months   The duration of this appointment visit was 25 minutes of face-to-face time with the patient.  Greater than 50% of this time was spent in counseling, explanation of diagnosis, planning of further management, and coordination of care.   Thank you for allowing me to participate in patient's care.  If I can answer any additional questions, I would be pleased to do so.    Sincerely,    Donika K. Posey Pronto, DO

## 2015-05-18 NOTE — Patient Instructions (Addendum)
1.  MRI brain wwo contrast 2.  MRI cervical spine wo contrast will be ordered after I have the results of your brain imaging 3.  Large volume LP after MRI brain 4.  Continue diamox 1000mg  three times daily 5.  Start topirmate 25mg  daily for one week, then increase to 1 tablet twice daily 6.  Check potassium level in 2 weeks 7.  Return to clinic in 2 months

## 2015-05-19 ENCOUNTER — Other Ambulatory Visit: Payer: Self-pay | Admitting: *Deleted

## 2015-05-19 DIAGNOSIS — R531 Weakness: Secondary | ICD-10-CM

## 2015-05-19 NOTE — Progress Notes (Signed)
Referrals sent

## 2015-05-23 ENCOUNTER — Ambulatory Visit
Admission: RE | Admit: 2015-05-23 | Discharge: 2015-05-23 | Disposition: A | Discharge status: Home / Self Care | Payer: Medicare Other | Source: Ambulatory Visit | Attending: Neurology | Admitting: Neurology

## 2015-05-23 DIAGNOSIS — Z853 Personal history of malignant neoplasm of breast: Secondary | ICD-10-CM

## 2015-05-23 DIAGNOSIS — R292 Abnormal reflex: Secondary | ICD-10-CM

## 2015-05-23 DIAGNOSIS — G932 Benign intracranial hypertension: Secondary | ICD-10-CM | POA: Diagnosis not present

## 2015-05-23 DIAGNOSIS — R531 Weakness: Secondary | ICD-10-CM

## 2015-05-23 NOTE — Discharge Instructions (Signed)

## 2015-05-29 ENCOUNTER — Inpatient Hospital Stay: Admission: RE | Admit: 2015-05-29 | Payer: Medicare Other | Source: Ambulatory Visit

## 2015-05-29 DIAGNOSIS — H05113 Granuloma of bilateral orbits: Secondary | ICD-10-CM | POA: Diagnosis not present

## 2015-05-29 DIAGNOSIS — H53452 Other localized visual field defect, left eye: Secondary | ICD-10-CM | POA: Diagnosis not present

## 2015-05-29 DIAGNOSIS — H4720 Unspecified optic atrophy: Secondary | ICD-10-CM | POA: Diagnosis not present

## 2015-06-06 ENCOUNTER — Ambulatory Visit
Admission: RE | Admit: 2015-06-06 | Discharge: 2015-06-06 | Disposition: A | Discharge status: Home / Self Care | Payer: Medicare Other | Source: Ambulatory Visit | Attending: Neurology | Admitting: Neurology

## 2015-06-06 DIAGNOSIS — M50222 Other cervical disc displacement at C5-C6 level: Secondary | ICD-10-CM | POA: Diagnosis not present

## 2015-06-06 DIAGNOSIS — M50223 Other cervical disc displacement at C6-C7 level: Secondary | ICD-10-CM | POA: Diagnosis not present

## 2015-06-06 DIAGNOSIS — R531 Weakness: Secondary | ICD-10-CM

## 2015-06-06 DIAGNOSIS — M50221 Other cervical disc displacement at C4-C5 level: Secondary | ICD-10-CM | POA: Diagnosis not present

## 2015-06-07 ENCOUNTER — Telehealth: Payer: Self-pay | Admitting: Neurology

## 2015-06-07 NOTE — Telephone Encounter (Signed)
Called patient to discuss her recent visit with Dr. Darrin Luis, opthalamology at [facility], and MRI cervical spine results.  Dr. Darrin Luis mentioned that because of the severity of nerve fiber layer loss, papilledema is not a good indication of raised ICP.  Because she requires several high volume taps per year, I am in favor of having her re-evaluated for a shunt evaluation.  She requests seeing a neurosurgeon in the [place] system.  MRI cervical spine shows mild multilevel disc bulges, at T2-3 there is a right foraminal disc protrusion possibly contributing to right sided radicular symptoms.  There is also right foraminal narrowing at C4-5 and C5-6.  We will send her for PT evaluation.  Erica [name] K. Posey Pronto, DO

## 2015-06-08 ENCOUNTER — Ambulatory Visit (HOSPITAL_BASED_OUTPATIENT_CLINIC_OR_DEPARTMENT_OTHER): Payer: Medicare Other

## 2015-06-08 VITALS — BP 181/99 | HR 92 | Resp 18

## 2015-06-08 DIAGNOSIS — C50412 Malignant neoplasm of upper-outer quadrant of left female breast: Secondary | ICD-10-CM

## 2015-06-08 DIAGNOSIS — Z5111 Encounter for antineoplastic chemotherapy: Secondary | ICD-10-CM | POA: Diagnosis present

## 2015-06-08 MED ORDER — GOSERELIN ACETATE 3.6 MG ~~LOC~~ IMPL
3.6000 mg | DRUG_IMPLANT | Freq: Once | SUBCUTANEOUS | Status: AC
  Administered 2015-06-08: 3.6 mg via SUBCUTANEOUS
  Filled 2015-06-08: qty 3.6

## 2015-06-08 NOTE — Patient Instructions (Signed)
Goserelin injection What is this medicine? GOSERELIN (GOE se rel in) is similar to a hormone found in the body. It lowers the amount of sex hormones that the body makes. Men will have lower testosterone levels and women will have lower estrogen levels while taking this medicine. In men, this medicine is used to treat prostate cancer; the injection is either given once per month or once every 12 weeks. A once per month injection (only) is used to treat women with endometriosis, dysfunctional uterine bleeding, or advanced breast cancer. This medicine may be used for other purposes; ask your health care provider or pharmacist if you have questions. What should I tell my health care provider before I take this medicine? They need to know if you have any of these conditions (some only apply to women): -diabetes -heart disease or previous heart attack -high blood pressure -high cholesterol -kidney disease -osteoporosis or low bone density -problems passing urine -spinal cord injury -stroke -tobacco smoker -an unusual or allergic reaction to goserelin, hormone therapy, other medicines, foods, dyes, or preservatives -pregnant or trying to get pregnant -breast-feeding How should I use this medicine? This medicine is for injection under the skin. It is given by a health care professional in a hospital or clinic setting. Men receive this injection once every 4 weeks or once every 12 weeks. Women will only receive the once every 4 weeks injection. Talk to your pediatrician regarding the use of this medicine in children. Special care may be needed. Overdosage: If you think you have taken too much of this medicine contact a poison control center or emergency room at once. NOTE: This medicine is only for you. Do not share this medicine with others. What if I miss a dose? It is important not to miss your dose. Call your doctor or health care professional if you are unable to keep an appointment. What may  interact with this medicine? -female hormones like estrogen -herbal or dietary supplements like black cohosh, chasteberry, or DHEA -female hormones like testosterone -prasterone This list may not describe all possible interactions. Give your health care provider a list of all the medicines, herbs, non-prescription drugs, or dietary supplements you use. Also tell them if you smoke, drink alcohol, or use illegal drugs. Some items may interact with your medicine. What should I watch for while using this medicine? Visit your doctor or health care professional for regular checks on your progress. Your symptoms may appear to get worse during the first weeks of this therapy. Tell your doctor or healthcare professional if your symptoms do not start to get better or if they get worse after this time. Your bones may get weaker if you take this medicine for a long time. If you smoke or frequently drink alcohol you may increase your risk of bone loss. A family history of osteoporosis, chronic use of drugs for seizures (convulsions), or corticosteroids can also increase your risk of bone loss. Talk to your doctor about how to keep your bones strong. This medicine should stop regular monthly menstration in women. Tell your doctor if you continue to menstrate. Women should not become pregnant while taking this medicine or for 12 weeks after stopping this medicine. Women should inform their doctor if they wish to become pregnant or think they might be pregnant. There is a potential for serious side effects to an unborn child. Talk to your health care professional or pharmacist for more information. Do not breast-feed an infant while taking this medicine. Men should   inform their doctors if they wish to father a child. This medicine may lower sperm counts. Talk to your health care professional or pharmacist for more information. What side effects may I notice from receiving this medicine? Side effects that you should  report to your doctor or health care professional as soon as possible: -allergic reactions like skin rash, itching or hives, swelling of the face, lips, or tongue -bone pain -breathing problems -changes in vision -chest pain -feeling faint or lightheaded, falls -fever, chills -pain, swelling, warmth in the leg -pain, tingling, numbness in the hands or feet -signs and symptoms of low blood pressure like dizziness; feeling faint or lightheaded, falls; unusually weak or tired -stomach pain -swelling of the ankles, feet, hands -trouble passing urine or change in the amount of urine -unusually high or low blood pressure -unusually weak or tired Side effects that usually do not require medical attention (report to your doctor or health care professional if they continue or are bothersome): -change in sex drive or performance -changes in breast size in both males and females -changes in emotions or moods -headache -hot flashes -irritation at site where injected -loss of appetite -skin problems like acne, dry skin -vaginal dryness This list may not describe all possible side effects. Call your doctor for medical advice about side effects. You may report side effects to FDA at 1-800-FDA-1088. Where should I keep my medicine? This drug is given in a hospital or clinic and will not be stored at home. NOTE: This sheet is a summary. It may not cover all possible information. If you have questions about this medicine, talk to your doctor, pharmacist, or health care provider.    2016, Elsevier/Gold Standard. (2013-05-25 11:10:35)  

## 2015-06-09 ENCOUNTER — Encounter (HOSPITAL_COMMUNITY): Payer: Self-pay | Admitting: *Deleted

## 2015-06-09 ENCOUNTER — Emergency Department (HOSPITAL_COMMUNITY)
Admission: EM | Admit: 2015-06-09 | Discharge: 2015-06-09 | Disposition: A | Discharge status: Home / Self Care | Payer: Medicare Other | Attending: Emergency Medicine | Admitting: Emergency Medicine

## 2015-06-09 ENCOUNTER — Other Ambulatory Visit: Payer: Self-pay

## 2015-06-09 DIAGNOSIS — Z880 Allergy status to penicillin: Secondary | ICD-10-CM | POA: Insufficient documentation

## 2015-06-09 DIAGNOSIS — I10 Essential (primary) hypertension: Secondary | ICD-10-CM | POA: Insufficient documentation

## 2015-06-09 DIAGNOSIS — Z862 Personal history of diseases of the blood and blood-forming organs and certain disorders involving the immune mechanism: Secondary | ICD-10-CM | POA: Diagnosis not present

## 2015-06-09 DIAGNOSIS — M545 Low back pain, unspecified: Secondary | ICD-10-CM

## 2015-06-09 DIAGNOSIS — G4733 Obstructive sleep apnea (adult) (pediatric): Secondary | ICD-10-CM | POA: Insufficient documentation

## 2015-06-09 DIAGNOSIS — Z79899 Other long term (current) drug therapy: Secondary | ICD-10-CM | POA: Insufficient documentation

## 2015-06-09 DIAGNOSIS — J45909 Unspecified asthma, uncomplicated: Secondary | ICD-10-CM | POA: Diagnosis not present

## 2015-06-09 DIAGNOSIS — Z7952 Long term (current) use of systemic steroids: Secondary | ICD-10-CM | POA: Diagnosis not present

## 2015-06-09 DIAGNOSIS — E876 Hypokalemia: Secondary | ICD-10-CM

## 2015-06-09 DIAGNOSIS — Z853 Personal history of malignant neoplasm of breast: Secondary | ICD-10-CM | POA: Diagnosis not present

## 2015-06-09 DIAGNOSIS — Z9981 Dependence on supplemental oxygen: Secondary | ICD-10-CM | POA: Diagnosis not present

## 2015-06-09 DIAGNOSIS — K219 Gastro-esophageal reflux disease without esophagitis: Secondary | ICD-10-CM | POA: Diagnosis not present

## 2015-06-09 LAB — CBC
HCT: 38 % (ref 36.0–46.0)
Hemoglobin: 12.7 g/dL (ref 12.0–15.0)
MCH: 29.1 pg (ref 26.0–34.0)
MCHC: 33.4 g/dL (ref 30.0–36.0)
MCV: 87.2 fL (ref 78.0–100.0)
PLATELETS: 244 10*3/uL (ref 150–400)
RBC: 4.36 MIL/uL (ref 3.87–5.11)
RDW: 13.1 % (ref 11.5–15.5)
WBC: 7.9 10*3/uL (ref 4.0–10.5)

## 2015-06-09 LAB — BASIC METABOLIC PANEL
Anion gap: 9 (ref 5–15)
BUN: 9 mg/dL (ref 6–20)
CALCIUM: 9.1 mg/dL (ref 8.9–10.3)
CO2: 21 mmol/L — AB (ref 22–32)
CREATININE: 0.8 mg/dL (ref 0.44–1.00)
Chloride: 112 mmol/L — ABNORMAL HIGH (ref 101–111)
GFR calc Af Amer: >60 mL/min (ref >60)
GFR calc non Af Amer: >60 mL/min (ref >60)
GLUCOSE: 96 mg/dL (ref 65–99)
Potassium: 2.8 mmol/L — ABNORMAL LOW (ref 3.5–5.1)
Sodium: 142 mmol/L (ref 135–145)

## 2015-06-09 LAB — URINALYSIS, ROUTINE W REFLEX MICROSCOPIC
BILIRUBIN URINE: NEGATIVE
GLUCOSE, UA: NEGATIVE mg/dL
HGB URINE DIPSTICK: NEGATIVE
Ketones, ur: NEGATIVE mg/dL
Leukocytes, UA: NEGATIVE
Nitrite: NEGATIVE
PROTEIN: NEGATIVE mg/dL
SPECIFIC GRAVITY, URINE: 1.007 (ref 1.005–1.030)
pH: 6 (ref 5.0–8.0)

## 2015-06-09 LAB — CBG MONITORING, ED: GLUCOSE-CAPILLARY: 98 mg/dL (ref 65–99)

## 2015-06-09 MED ORDER — OXYCODONE-ACETAMINOPHEN 5-325 MG PO TABS
2.0000 | ORAL_TABLET | Freq: Once | ORAL | Status: AC
  Administered 2015-06-09: 2 via ORAL
  Filled 2015-06-09: qty 2

## 2015-06-09 MED ORDER — HYDRALAZINE HCL 50 MG PO TABS
100.0000 mg | ORAL_TABLET | Freq: Once | ORAL | Status: AC
  Administered 2015-06-09: 100 mg via ORAL
  Filled 2015-06-09: qty 2

## 2015-06-09 MED ORDER — OXYCODONE-ACETAMINOPHEN 5-325 MG PO TABS: 2.0000 | ORAL_TABLET | ORAL | Status: DC | PRN

## 2015-06-09 MED ORDER — CLONIDINE HCL 0.1 MG PO TABS
0.2000 mg | ORAL_TABLET | Freq: Once | ORAL | Status: AC
  Administered 2015-06-09: 0.2 mg via ORAL
  Filled 2015-06-09: qty 2

## 2015-06-09 MED ORDER — POTASSIUM CHLORIDE CRYS ER 20 MEQ PO TBCR
60.0000 meq | EXTENDED_RELEASE_TABLET | Freq: Once | ORAL | Status: AC
  Administered 2015-06-09: 60 meq via ORAL
  Filled 2015-06-09: qty 3

## 2015-06-09 NOTE — Discharge Instructions (Signed)
Back Pain, Adult °Back pain is very common in adults. The cause of back pain is rarely dangerous and the pain often gets better over time. The cause of your back pain may not be known. Some common causes of back pain include: °· Strain of the muscles or ligaments supporting the spine. °· Wear and tear (degeneration) of the spinal disks. °· Arthritis. °· Direct injury to the back. °For many people, back pain may return. Since back pain is rarely dangerous, most people can learn to manage this condition on their own. °HOME CARE INSTRUCTIONS °Watch your back pain for any changes. The following actions may help to lessen any discomfort you are feeling: °· Remain active. It is stressful on your back to sit or stand in one place for long periods of time. Do not sit, drive, or stand in one place for more than 30 minutes at a time. Take short walks on even surfaces as soon as you are able. Try to increase the length of time you walk each day. °· Exercise regularly as directed by your health care provider. Exercise helps your back heal faster. It also helps avoid future injury by keeping your muscles strong and flexible. °· Do not stay in bed. Resting more than 1-2 days can delay your recovery. °· Pay attention to your body when you bend and lift. The most comfortable positions are those that put less stress on your recovering back. Always use proper lifting techniques, including: °· Bending your knees. °· Keeping the load close to your body. °· Avoiding twisting. °· Find a comfortable position to sleep. Use a firm mattress and lie on your side with your knees slightly bent. If you lie on your back, put a pillow under your knees. °· Avoid feeling anxious or stressed. Stress increases muscle tension and can worsen back pain. It is important to recognize when you are anxious or stressed and learn ways to manage it, such as with exercise. °· Take medicines only as directed by your health care provider. Over-the-counter  medicines to reduce pain and inflammation are often the most helpful. Your health care provider may prescribe muscle relaxant drugs. These medicines help dull your pain so you can more quickly return to your normal activities and healthy exercise. °· Apply ice to the injured area: °· Put ice in a plastic bag. °· Place a towel between your skin and the bag. °· Leave the ice on for 20 minutes, 2-3 times a day for the first 2-3 days. After that, ice and heat may be alternated to reduce pain and spasms. °· Maintain a healthy weight. Excess weight puts extra stress on your back and makes it difficult to maintain good posture. °SEEK MEDICAL CARE IF: °· You have pain that is not relieved with rest or medicine. °· You have increasing pain going down into the legs or buttocks. °· You have pain that does not improve in one week. °· You have night pain. °· You lose weight. °· You have a fever or chills. °SEEK IMMEDIATE MEDICAL CARE IF:  °· You develop new bowel or bladder control problems. °· You have unusual weakness or numbness in your arms or legs. °· You develop nausea or vomiting. °· You develop abdominal pain. °· You feel faint. °  °This information is not intended to replace advice given to you by your health care provider. Make sure you discuss any questions you have with your health care provider. °  °Document Released: 03/18/2005 Document Revised: 04/08/2014 Document Reviewed: 07/20/2013 °Elsevier Interactive Patient Education ©2016 Else[employer]   Inc. Hypokalemia Double your potassium dose for the next 3 days. See your doctor for recheck. Resume your normal dose. Hypokalemia means that the amount of potassium in the blood is lower than normal.Potassium is a chemical, called an electrolyte, that helps regulate the amount of fluid in the body. It also stimulates muscle contraction and helps nerves function properly.Most of the body's potassium is inside of cells, and only a very small amount is in the blood. Because  the amount in the blood is so small, minor changes can be life-threatening. CAUSES  Antibiotics.  Diarrhea or vomiting.  Using laxatives too much, which can cause diarrhea.  Chronic kidney disease.  Water pills (diuretics).  Eating disorders (bulimia).  Low magnesium level.  Sweating a lot. SIGNS AND SYMPTOMS  Weakness.  Constipation.  Fatigue.  Muscle cramps.  Mental confusion.  Skipped heartbeats or irregular heartbeat (palpitations).  Tingling or numbness. DIAGNOSIS  Your health care provider can diagnose hypokalemia with blood tests. In addition to checking your potassium level, your health care provider may also check other lab tests. TREATMENT Hypokalemia can be treated with potassium supplements taken by mouth or adjustments in your current medicines. If your potassium level is very low, you may need to get potassium through a vein (IV) and be monitored in the hospital. A diet high in potassium is also helpful. Foods high in potassium are:  Nuts, such as peanuts and pistachios.  Seeds, such as sunflower seeds and pumpkin seeds.  Peas, lentils, and lima beans.  Whole grain and bran cereals and breads.  Fresh fruit and vegetables, such as apricots, avocado, bananas, cantaloupe, kiwi, oranges, tomatoes, asparagus, and potatoes.  Orange and tomato juices.  Red meats.  Fruit yogurt. HOME CARE INSTRUCTIONS  Take all medicines as prescribed by your health care provider.  Maintain a healthy diet by including nutritious food, such as fruits, vegetables, nuts, whole grains, and lean meats.  If you are taking a laxative, be sure to follow the directions on the label. SEEK MEDICAL CARE IF:  Your weakness gets worse.  You feel your heart pounding or racing.  You are vomiting or having diarrhea.  You are diabetic and having trouble keeping your blood glucose in the normal range. SEEK IMMEDIATE MEDICAL CARE IF:  You have chest pain, shortness of breath,  or dizziness.  You are vomiting or having diarrhea for more than 2 days.  You faint. MAKE SURE YOU:   Understand these instructions.  Will watch your condition.  Will get help right away if you are not doing well or get worse.   This information is not intended to replace advice given to you by your health care provider. Make sure you discuss any questions you have with your health care provider.   Document Released: 03/18/2005 Document Revised: 04/08/2014 Document Reviewed: 09/18/2012 Elsevier Interactive Patient Education [employer].

## 2015-06-09 NOTE — ED Provider Notes (Signed)
CSN: [id number]     Arrival date & time 06/09/15  2134 History   First MD Initiated Contact with Patient 06/09/15 2214     Chief Complaint  Patient presents with  . Back Pain     (Consider location/radiation/quality/duration/timing/severity/associated sxs/prior Treatment) HPI Patient states that she's having lower back pain. It has been there for a couple of days. She reports that it radiates out over the tops of her hips. It does not radiate into her legs. She reports she has some chronic neuropathy but has not had new weakness numbness or tingling into the legs. She reports that she urinates a lot because she takes a diuretic. She denies associated abdominal pain. She reports that she was concerned because this is away she felt before she went into kidney failure. No vomiting. She reports she had a low-grade fever yesterday up to 100.2. No fever today. She states she is compliant with her blood pressure medications. She reports her blood pressure is always difficult to manage. She states for her, a really good pressure is [id number] systolic. That doesn't happen very often. She states her medications are frequently being readjusted and changed in attempts to manage her blood pressure. Patient notes today she has had slight blurring of her vision. Patient has not yet taken her evening blood pressure medications. Past Medical History  Diagnosis Date  . Asthma   . Hypertension   . GERD (gastroesophageal reflux disease)   . Impaired glucose tolerance 04/13/2011  . Anemia, unspecified 04/13/2011  . Asthma 09/27/2010  . Bloating 03/18/2011  . 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 Lymphadenitis, chronic     restricted LEFT extremity  . Dyspnea on exertion     a. 01/2013 Lexi MV: EF 54%, no ischemia/infarct;  b. 05/2013 Echo: EF 60-65%, no rwma, Gr 2 DD;  b.   . OSA (obstructive sleep apnea) 05/04/2014    AHI 31/hr now on CPAP at 12cm H2O  . Breast cancer ([place]) 2010    a. locally advanced left breast  carcinoma diagnosed in 2010 and treated with neoadjuvant chemotherapy with docetaxel and cyclophosphamide as well as paclitaxel. This was followed by radiation therapy which was completed in 2011.  . Fatty liver   . Gastroparesis    Past Surgical History  Procedure Laterality Date  . Tendon release Right 2012    "hand"  . Breast lumpectomy Left 09/2008  . Carpal tunnel release Left 2011  . Tubal ligation  05/11/2011    Procedure: POST PARTUM TUBAL LIGATION;  Surgeon: Emeterio Reeve, MD;  Location: [facility];  Service: Gynecology;  Laterality: Bilateral;  Induced for HTN  . Lymph node dissection Left 2010    breast; 2 wks after breast lumpectomy   Family History  Problem Relation Age of Onset  . Diabetes Maternal Grandmother   . Heart disease Maternal Grandmother   . Hypertension Maternal Grandmother   . Cancer Neg Hx   . Alcohol abuse Neg Hx   . Early death Neg Hx   . Hyperlipidemia Neg Hx   . Kidney disease Neg Hx   . Stroke Neg Hx   . Colon cancer Neg Hx    Social History  Substance Use Topics  . Smoking status: Never Smoker   . Smokeless tobacco: Never Used  . Alcohol Use: 0.0 oz/week    0 Standard drinks or equivalent per week     Comment: occasionally   OB History    Gravida Para Term Preterm AB TAB SAB  Ectopic Multiple Living   5 4 4  1  1   4      Review of Systems  10 Systems reviewed and are negative for acute change except as noted in the HPI.  Allergies  Aspirin; Doxycycline; Penicillins; Kiwi extract; and Strawberry extract  Home Medications   Prior to Admission medications   Medication Sig Start Date End Date Taking? Authorizing Provider  acetaZOLAMIDE (DIAMOX) 250 MG tablet Take 4 tablets (1,000 mg total) by mouth 3 (three) times daily. 05/18/15  Yes Donika K Patel, DO  albuterol (PROVENTIL HFA;VENTOLIN HFA) 108 (90 BASE) MCG/ACT inhaler Inhale 2 puffs into the lungs every 4 (four) hours as needed for wheezing or shortness of breath (((PLAN A))). Patient  taking differently: Inhale 2 puffs into the lungs every 4 (four) hours as needed for wheezing or shortness of breath (((PLAN B))).  08/30/14  Yes Tammy S Parrett, NP  albuterol (PROVENTIL) (2.5 MG/3ML) 0.083% nebulizer solution INHALE 1 VIAL VIA NEBULIZER EVERY 4 HOURS AS NEEDED FOR WHEEZING OR SHORTNESS OF BREATH 05/10/15  Yes Tanda Rockers, MD  Biotin 10 MG TABS Take 1 tablet by mouth every morning.    Yes Historical Provider, MD  BYSTOLIC 10 MG tablet Take 1 tablet (10 mg total) by mouth daily. 05/09/15  Yes [name], MD  chlorpheniramine (CHLOR-TRIMETON) 4 MG tablet Take 8 mg by mouth at bedtime.   Yes Historical Provider, MD  cloNIDine (CATAPRES) 0.3 MG tablet Take 1 tablet by mouth 2 (two) times daily. 01/22/15  Yes Historical Provider, MD  dexlansoprazole (DEXILANT) 60 MG capsule Take 60 mg by mouth daily.   Yes Historical Provider, MD  dextromethorphan-guaiFENesin (MUCINEX DM) 30-600 MG per 12 hr tablet Take 2 tablets by mouth 2 (two) times daily as needed (for cough, congestion, and thick mucus).    Yes Historical Provider, MD  diazepam (VALIUM) 5 MG tablet Take 1 tablet 30-min prior to MRI.  OK to repeat at facility.  Be sure someone is driving you. 05/18/15  Yes Donika K Patel, DO  diltiazem (CARDIZEM LA) 420 MG 24 hr tablet Take 1 tablet (420 mg total) by mouth daily. 04/07/15  Yes [name], MD  Eszopiclone 3 MG TABS Take 1 tablet (3 mg total) by mouth at bedtime. Take immediately before bedtime 05/09/15  Yes [name], MD  fluocinonide-emollient (LIDEX-E) 0.05 % cream Apply 1 application topically 2 (two) times daily. 05/09/15  Yes [name], MD  hydrALAZINE (APRESOLINE) 100 MG tablet Take 1 tablet (100 mg total) by mouth 3 (three) times daily. 11/08/14  Yes Biagio Borg, MD  hydrochlorothiazide (MICROZIDE) 12.5 MG capsule TAKE 1 CAPSULE(12.5 MG) BY MOUTH DAILY 05/10/15  Yes [name], MD  hyoscyamine (LEVSIN SL) 0.125 MG SL tablet Place 1 tablet (0.125 mg total) under the  tongue every 8 (eight) hours as needed for cramping. 01/23/15  Yes Jerene Bears, MD  isosorbide mononitrate (IMDUR) 60 MG 24 hr tablet Take 1 tablet (60 mg total) by mouth daily. 02/08/14  Yes [name], MD  LORazepam (ATIVAN) 0.5 MG tablet TAKE 1 TABLET BY MOUTH EVERY 8 HOURS AS NEEDED FOR ANXIETY 05/17/15  Yes Chauncey Cruel, MD  metoCLOPramide (REGLAN) 10 MG tablet TK 1 T PO QID WC AND HS 04/06/15  Yes Historical Provider, MD  mometasone (NASONEX) 50 MCG/ACT nasal spray Place 2 sprays into the nose 2 (two) times daily.   Yes Historical Provider, MD  montelukast (SINGULAIR) 10 MG  tablet TAKE 1 TABLET BY MOUTH EVERY NIGHT AT BEDTIME 04/07/15  Yes Tanda Rockers, MD  potassium chloride SA (K-DUR,KLOR-CON) 20 MEQ tablet Take 20 mEq by mouth 3 (three) times daily.   Yes Historical Provider, MD  pregabalin (LYRICA) 200 MG capsule Take 1 capsule (200 mg total) by mouth 3 (three) times daily. 01/23/15  Yes Donika K Patel, DO  primidone (MYSOLINE) 50 MG tablet Take 0.5 tablets (25 mg total) by mouth at bedtime. 01/23/15  Yes Donika K Patel, DO  spironolactone (ALDACTONE) 25 MG tablet Take 1 tablet by mouth every morning and 2 tablets by mouth at bedtime   Yes Historical Provider, MD  SYMBICORT 160-4.5 MCG/ACT inhaler INHALE 2 PUFFS BY MOUTH TWICE DAILY 01/23/15  Yes Tammy S Parrett, NP  topiramate (TOPAMAX) 25 MG tablet Take 1 tablet daily for one week, then increase to 1 tablet twice daily. 05/18/15  Yes Donika K Patel, DO  traMADol (ULTRAM) 50 MG tablet TAKE 1 TO 2 TABLETS BY MOUTH EVERY 4 HOURS AS NEEDED FOR COUGH OR PAIN 05/10/15  Yes Tanda Rockers, MD  EPINEPHrine 0.3 mg/0.3 mL IJ SOAJ injection Inject 0.3 mLs (0.3 mg total) into the muscle once. Patient taking differently: Use as directed for allergic reaction 01/28/14   [name], MD  nitroGLYCERIN (NITROSTAT) 0.4 MG SL tablet Place 1 tablet (0.4 mg total) under the tongue every 5 (five) minutes as needed for chest pain. 08/24/13   [name], MD  oxyCODONE-acetaminophen (PERCOCET) 5-325 MG tablet Take 2 tablets by mouth every 4 (four) hours as needed. 06/09/15   Charlesetta Shanks, MD  Respiratory Therapy Supplies (FLUTTER) DEVI Use as directed 03/24/15   Tanda Rockers, MD   BP 163/71 mmHg  Pulse 100  Temp(Src) 97.9 F (36.6 C) (Oral)  Resp 22  Ht 5\' 6"  (1.676 m)  Wt 198 lb (89.812 kg)  BMI 31.97 kg/m2  SpO2 99% Physical Exam  Constitutional: She is oriented to person, place, and time. She appears well-developed and well-nourished.  Patient has moderate central obesity. She is alert and nontoxic. No respiratory distress.  HENT:  Head: Normocephalic and atraumatic.  Right Ear: External ear normal.  Left Ear: External ear normal.  Nose: Nose normal.  Mouth/Throat: Oropharynx is clear and moist.  Eyes: EOM are normal. Pupils are equal, round, and reactive to light.  Mild diffuse injection of bilateral conjunctiva. Extraocular motions are normal. Pupils are mid range and responsive.  Neck: Neck supple.  Cardiovascular: Normal rate, regular rhythm, normal heart sounds and intact distal pulses.   Pulmonary/Chest: Effort normal and breath sounds normal.  Abdominal: Soft. Bowel sounds are normal. She exhibits no distension. There is no tenderness.  Musculoskeletal: Normal range of motion. She exhibits no edema or tenderness.  Neurological: She is alert and oriented to person, place, and time. She has normal strength. No cranial nerve deficit. She exhibits normal muscle tone. Coordination normal. GCS eye subscore is 4. GCS verbal subscore is 5. GCS motor subscore is 6.  Skin: Skin is warm, dry and intact.  Psychiatric: She has a normal mood and affect.    ED Course  Procedures (including critical care time) Labs Review Labs Reviewed  BASIC METABOLIC PANEL - Abnormal; Notable for the following:    Potassium 2.8 (*)    Chloride 112 (*)    CO2 21 (*)    All other components within normal limits  CBC  URINALYSIS,  ROUTINE W REFLEX MICROSCOPIC (NOT AT [facility])  CBG MONITORING, ED    Imaging Review No results found. I have personally reviewed and evaluated these images and lab results as part of my medical decision-making.   EKG Interpretation None      MDM   Final diagnoses:  Bilateral low back pain without sciatica  Hypokalemia   The patient is a back pain does not have associated neurologic dysfunction. This is muscular skeletal quality with this being low and radiating across the tops of her hips. Patient's concern was for renal failure however her labs are normal with no indication of UTI or renal dysfunction. She does have hypokalemia. Patient will get 1 supplement dose in the emergency department of 60 mEq with instructions to double her daily potassium dose for the next 3 days. Patient is to follow-up with her physician on Monday or Tuesday for recheck. She is alert and well in appearance. She has no neurologic dysfunction. 2 (Percocet for back pain. Patient reports that she is due to get seen by pain management however they would not see her until her blood pressures were controlled adequately.    Charlesetta Shanks, MD 06/09/15 2306

## 2015-06-09 NOTE — ED Notes (Signed)
Pt states that she had onset of lower back pain, dizziness, and blurry vision since today. She says she felt the same way in July when she went into renal failure.

## 2015-06-13 ENCOUNTER — Telehealth (HOSPITAL_BASED_OUTPATIENT_CLINIC_OR_DEPARTMENT_OTHER): Payer: Self-pay | Admitting: Emergency Medicine

## 2015-06-14 NOTE — Telephone Encounter (Signed)
I faxed both on 06-12-15.

## 2015-06-15 ENCOUNTER — Other Ambulatory Visit (INDEPENDENT_AMBULATORY_CARE_PROVIDER_SITE_OTHER): Payer: Medicare Other

## 2015-06-15 ENCOUNTER — Ambulatory Visit (INDEPENDENT_AMBULATORY_CARE_PROVIDER_SITE_OTHER): Payer: Medicare Other | Admitting: Internal Medicine

## 2015-06-15 ENCOUNTER — Encounter: Payer: Self-pay | Admitting: Internal Medicine

## 2015-06-15 VITALS — BP 152/86 | HR 96 | Temp 99.1°F | Resp 16 | Wt 200.0 lb

## 2015-06-15 DIAGNOSIS — F329 Major depressive disorder, single episode, unspecified: Secondary | ICD-10-CM | POA: Diagnosis not present

## 2015-06-15 DIAGNOSIS — F32A Depression, unspecified: Secondary | ICD-10-CM

## 2015-06-15 DIAGNOSIS — E876 Hypokalemia: Secondary | ICD-10-CM | POA: Diagnosis not present

## 2015-06-15 DIAGNOSIS — F5105 Insomnia due to other mental disorder: Secondary | ICD-10-CM

## 2015-06-15 DIAGNOSIS — I10 Essential (primary) hypertension: Secondary | ICD-10-CM

## 2015-06-15 DIAGNOSIS — F419 Anxiety disorder, unspecified: Secondary | ICD-10-CM

## 2015-06-15 DIAGNOSIS — F450 Somatization disorder: Secondary | ICD-10-CM

## 2015-06-15 DIAGNOSIS — E781 Pure hyperglyceridemia: Secondary | ICD-10-CM

## 2015-06-15 HISTORY — DX: Depression, unspecified: F32.A

## 2015-06-15 HISTORY — DX: Major depressive disorder, single episode, unspecified: F32.9

## 2015-06-15 HISTORY — DX: Pure hyperglyceridemia: E78.1

## 2015-06-15 LAB — BASIC METABOLIC PANEL
BUN: 16 mg/dL (ref 6–23)
CHLORIDE: 107 meq/L (ref 96–112)
CO2: 26 meq/L (ref 19–32)
Calcium: 9.8 mg/dL (ref 8.4–10.5)
Creatinine, Ser: 1.37 mg/dL — ABNORMAL HIGH (ref 0.40–1.20)
GFR: 54.45 mL/min — ABNORMAL LOW (ref >60.00)
GLUCOSE: 108 mg/dL — AB (ref 70–99)
POTASSIUM: 3.3 meq/L — AB (ref 3.5–5.1)
SODIUM: 141 meq/L (ref 135–145)

## 2015-06-15 LAB — LIPID PANEL
CHOLESTEROL: 206 mg/dL — AB (ref 0–200)
HDL: 44.1 mg/dL (ref >39.00)
LDL CALC: 140 mg/dL — AB (ref 0–99)
NonHDL: 161.92
TRIGLYCERIDES: 110 mg/dL (ref 0.0–149.0)
Total CHOL/HDL Ratio: 5
VLDL: 22 mg/dL (ref 0.0–40.0)

## 2015-06-15 LAB — TSH: TSH: 1.06 u[IU]/mL (ref 0.35–4.50)

## 2015-06-15 LAB — MAGNESIUM: Magnesium: 2.3 mg/dL (ref 1.5–2.5)

## 2015-06-15 MED ORDER — DULOXETINE HCL 30 MG PO CPEP: 30.0000 mg | ORAL_CAPSULE | Freq: Every day | ORAL | Status: DC

## 2015-06-15 MED ORDER — TRAZODONE HCL 150 MG PO TABS: 150.0000 mg | ORAL_TABLET | Freq: Every day | ORAL | Status: DC

## 2015-06-15 NOTE — Progress Notes (Signed)
Subjective:  Patient ID: Erica [name], female    DOB: 1975-07-04  Age: 40 y.o. MRN: [id number]  CC: Depression   HPI FRANCESE SHANK presents for Follow-up on a recent emergency room visit. She was recently seen for low back pain and was found to have a low potassium level. She complained of muscle twitches and weakness. She has been taking the potassium supplement and is feeling better. She also complains of weight gain but she denies nausea, vomiting, diarrhea, or abdominal pain.  She complains of insomnia and says Johnnye Sima has not helped. She reports frequent awakenings stating that she will awaken at 1-2 AM and will not fall asleep until 5:00 AM. She complains of worsening depression with crying spells, anhedonia, feeling worthless/helpless/hopeless, she denies suicidal or homicidal ideations.  She also complains of diffuse widespread pain consistent with her history of fibromyalgia.  Outpatient Prescriptions Prior to Visit  Medication Sig Dispense Refill  . acetaZOLAMIDE (DIAMOX) 250 MG tablet Take 4 tablets (1,000 mg total) by mouth 3 (three) times daily. 400 tablet 11  . albuterol (PROVENTIL HFA;VENTOLIN HFA) 108 (90 BASE) MCG/ACT inhaler Inhale 2 puffs into the lungs every 4 (four) hours as needed for wheezing or shortness of breath (((PLAN A))). (Patient taking differently: Inhale 2 puffs into the lungs every 4 (four) hours as needed for wheezing or shortness of breath (((PLAN B))). ) 18 g 5  . Biotin 10 MG TABS Take 1 tablet by mouth every morning.     Marland Kitchen BYSTOLIC 10 MG tablet Take 1 tablet (10 mg total) by mouth daily. 90 tablet 3  . chlorpheniramine (CHLOR-TRIMETON) 4 MG tablet Take 8 mg by mouth at bedtime.    . cloNIDine (CATAPRES) 0.3 MG tablet Take 1 tablet by mouth 2 (two) times daily.  2  . dexlansoprazole (DEXILANT) 60 MG capsule Take 60 mg by mouth daily.    Marland Kitchen dextromethorphan-guaiFENesin (MUCINEX DM) 30-600 MG per 12 hr tablet Take 2 tablets by mouth 2 (two) times daily as  needed (for cough, congestion, and thick mucus).     Marland Kitchen diltiazem (CARDIZEM LA) 420 MG 24 hr tablet Take 1 tablet (420 mg total) by mouth daily. 90 tablet 3  . EPINEPHrine 0.3 mg/0.3 mL IJ SOAJ injection Inject 0.3 mLs (0.3 mg total) into the muscle once. (Patient taking differently: Use as directed for allergic reaction) 2 Device 1  . fluocinonide-emollient (LIDEX-E) 0.05 % cream Apply 1 application topically 2 (two) times daily. 60 g 1  . hydrALAZINE (APRESOLINE) 100 MG tablet Take 1 tablet (100 mg total) by mouth 3 (three) times daily. 90 tablet 11  . hydrochlorothiazide (MICROZIDE) 12.5 MG capsule TAKE 1 CAPSULE(12.5 MG) BY MOUTH DAILY 90 capsule 1  . hyoscyamine (LEVSIN SL) 0.125 MG SL tablet Place 1 tablet (0.125 mg total) under the tongue every 8 (eight) hours as needed for cramping. 30 tablet 2  . isosorbide mononitrate (IMDUR) 60 MG 24 hr tablet Take 1 tablet (60 mg total) by mouth daily. 30 tablet 11  . mometasone (NASONEX) 50 MCG/ACT nasal spray Place 2 sprays into the nose 2 (two) times daily.    . montelukast (SINGULAIR) 10 MG tablet TAKE 1 TABLET BY MOUTH EVERY NIGHT AT BEDTIME 90 tablet 1  . nitroGLYCERIN (NITROSTAT) 0.4 MG SL tablet Place 1 tablet (0.4 mg total) under the tongue every 5 (five) minutes as needed for chest pain. 90 tablet 3  . potassium chloride SA (K-DUR,KLOR-CON) 20 MEQ tablet Take 20 mEq by mouth 3 (  three) times daily.    . pregabalin (LYRICA) 200 MG capsule Take 1 capsule (200 mg total) by mouth 3 (three) times daily. 90 capsule 5  . primidone (MYSOLINE) 50 MG tablet Take 0.5 tablets (25 mg total) by mouth at bedtime. 30 tablet 5  . Respiratory Therapy Supplies (FLUTTER) DEVI Use as directed 1 each 0  . spironolactone (ALDACTONE) 25 MG tablet Take 1 tablet by mouth every morning and 2 tablets by mouth at bedtime    . SYMBICORT 160-4.5 MCG/ACT inhaler INHALE 2 PUFFS BY MOUTH TWICE DAILY 10.2 g 5  . topiramate (TOPAMAX) 25 MG tablet Take 1 tablet daily for one week,  then increase to 1 tablet twice daily. 60 tablet 5  . albuterol (PROVENTIL) (2.5 MG/3ML) 0.083% nebulizer solution INHALE 1 VIAL VIA NEBULIZER EVERY 4 HOURS AS NEEDED FOR WHEEZING OR SHORTNESS OF BREATH 300 mL 0  . diazepam (VALIUM) 5 MG tablet Take 1 tablet 30-min prior to MRI.  OK to repeat at facility.  Be sure someone is driving you. 2 tablet 0  . Eszopiclone 3 MG TABS Take 1 tablet (3 mg total) by mouth at bedtime. Take immediately before bedtime 30 tablet 5  . LORazepam (ATIVAN) 0.5 MG tablet TAKE 1 TABLET BY MOUTH EVERY 8 HOURS AS NEEDED FOR ANXIETY 90 tablet 0  . metoCLOPramide (REGLAN) 10 MG tablet TK 1 T PO QID WC AND HS  4  . oxyCODONE-acetaminophen (PERCOCET) 5-325 MG tablet Take 2 tablets by mouth every 4 (four) hours as needed. 20 tablet 0  . traMADol (ULTRAM) 50 MG tablet TAKE 1 TO 2 TABLETS BY MOUTH EVERY 4 HOURS AS NEEDED FOR COUGH OR PAIN 40 tablet 0   Facility-Administered Medications Prior to Visit  Medication Dose Route Frequency Provider Last Rate Last Dose  . goserelin (ZOLADEX) injection 3.6 mg  3.6 mg Subcutaneous Once Chauncey Cruel, MD        ROS Review of Systems  Constitutional: Positive for unexpected weight change. Negative for fever (weight gain), chills, diaphoresis, appetite change and fatigue.  HENT: Negative.   Eyes: Negative.   Respiratory: Negative.  Negative for cough, choking, chest tightness, shortness of breath and stridor.   Cardiovascular: Negative.  Negative for chest pain, palpitations and leg swelling.  Gastrointestinal: Negative.  Negative for nausea, vomiting, abdominal pain, diarrhea, constipation and blood in stool.  Endocrine: Negative.   Genitourinary: Negative.   Musculoskeletal: Positive for myalgias. Negative for back pain, joint swelling, arthralgias, gait problem, neck pain and neck stiffness.  Skin: Negative.  Negative for color change.  Allergic/Immunologic: Negative.   Neurological: Negative.  Negative for dizziness, tremors,  weakness, light-headedness and headaches.  Hematological: Negative for adenopathy. Does not bruise/bleed easily.  Psychiatric/Behavioral: Positive for sleep disturbance and dysphoric mood. Negative for decreased concentration. The patient is not nervous/anxious.     Objective:  BP 152/86 mmHg  Pulse 96  Temp(Src) 99.1 F (37.3 C)  Resp 16  Wt 200 lb (90.719 kg)  SpO2 97%  BP Readings from Last 3 Encounters:  06/15/15 152/86  06/09/15 163/71  06/08/15 181/99    Wt Readings from Last 3 Encounters:  06/15/15 200 lb (90.719 kg)  06/09/15 198 lb (89.812 kg)  06/06/15 191 lb (86.637 kg)    Physical Exam  Constitutional: She is oriented to person, place, and time. She appears well-developed and well-nourished. No distress.  HENT:  Mouth/Throat: Oropharynx is clear and moist. No oropharyngeal exudate.  Eyes: Conjunctivae are normal. Right eye exhibits no  discharge. Left eye exhibits no discharge. No scleral icterus.  Neck: Normal range of motion. Neck supple. No JVD present. No tracheal deviation present. No thyromegaly present.  Cardiovascular: Normal rate, regular rhythm, normal heart sounds and intact distal pulses.  Exam reveals no gallop and no friction rub.   No murmur heard. Pulmonary/Chest: Effort normal and breath sounds normal. No stridor. No respiratory distress. She has no wheezes. She has no rales. She exhibits no tenderness.  Abdominal: Soft. Bowel sounds are normal. She exhibits no distension and no mass. There is no tenderness. There is no rebound and no guarding.  Musculoskeletal: Normal range of motion. She exhibits no edema or tenderness.  Lymphadenopathy:    She has no cervical adenopathy.  Neurological: She is oriented to person, place, and time.  Skin: Skin is warm and dry. No rash noted. She is not diaphoretic. No erythema. No pallor.  Psychiatric: Judgment and thought content normal. Her mood appears not anxious. Her affect is not angry, not blunt, not labile  and not inappropriate. Her speech is not rapid and/or pressured. She is not agitated, not slowed and not withdrawn. Cognition and memory are normal. She exhibits a depressed mood. She expresses no homicidal and no suicidal ideation. She expresses no suicidal plans and no homicidal plans.  She is tearful throughout the interview today She is attentive.  Vitals reviewed.   Lab Results  Component Value Date   WBC 7.9 06/09/2015   HGB 12.7 06/09/2015   HCT 38.0 06/09/2015   PLT 244 06/09/2015   GLUCOSE 108* 06/15/2015   CHOL 206* 06/15/2015   TRIG 110.0 06/15/2015   HDL 44.10 06/15/2015   LDLCALC 140* 06/15/2015   ALT 27 09/20/2014   AST 28 09/20/2014   NA 141 06/15/2015   K 3.3* 06/15/2015   CL 107 06/15/2015   CREATININE 1.37* 06/15/2015   BUN 16 06/15/2015   CO2 26 06/15/2015   TSH 1.06 06/15/2015   INR 1.1* 06/03/2012   HGBA1C 5.7 11/08/2014    No results found.  Assessment & Plan:   Anegla was seen today for depression.  Diagnoses and all orders for this visit:  Essential hypertension- her blood pressure is not adequately well controlled, she agrees to be more compliant with her lifestyle modifications and medication compliance -     Basic metabolic panel; Future  Insomnia secondary to anxiety- will discontinue Lunesta and will try trazodone -     traZODone (DESYREL) 150 MG tablet; Take 1 tablet (150 mg total) by mouth at bedtime.  Depression with somatization- I think trazodone plus an SNRI is a good combination to treat her depression with musculoskeletal symptoms as well as insomnia. -     traZODone (DESYREL) 150 MG tablet; Take 1 tablet (150 mg total) by mouth at bedtime. -     DULoxetine (CYMBALTA) 30 MG capsule; Take 1 capsule (30 mg total) by mouth daily. -     TSH; Future  Hypertriglyceridemia without hypercholesterolemia- her triglycerides has improved, her Framingham risk score is only 1% so I do not recommend that she start a statin -     Lipid panel;  Future -     TSH; Future  Hypokalemia- this is caused by the thiazide diuretic, since her visit to the emergency room her potassium level has improved, I have encouraged her to be more compliant with potassium replacement therapy. -     Basic metabolic panel; Future -     Magnesium; Future   I have discontinued  Ms. Haus Eszopiclone, traMADol, LORazepam, metoCLOPramide, diazepam, and oxyCODONE-acetaminophen. I am also having her start on traZODone and DULoxetine. Additionally, I am having her maintain her nitroGLYCERIN, EPINEPHrine, isosorbide mononitrate, Biotin, albuterol, dextromethorphan-guaiFENesin, potassium chloride SA, chlorpheniramine, hydrALAZINE, mometasone, spironolactone, dexlansoprazole, SYMBICORT, primidone, pregabalin, hyoscyamine, cloNIDine, FLUTTER, montelukast, diltiazem, BYSTOLIC, fluocinonide-emollient, hydrochlorothiazide, acetaZOLAMIDE, and topiramate.  Meds ordered this encounter  Medications  . traZODone (DESYREL) 150 MG tablet    Sig: Take 1 tablet (150 mg total) by mouth at bedtime.    Dispense:  90 tablet    Refill:  1  . DULoxetine (CYMBALTA) 30 MG capsule    Sig: Take 1 capsule (30 mg total) by mouth daily.    Dispense:  30 capsule    Refill:  2     Follow-up: Return in about 2 months (around 08/15/2015).  Scarlette Calico, MD

## 2015-06-15 NOTE — Patient Instructions (Signed)
Major Depressive Disorder Major depressive disorder is a mental illness. It also may be called clinical depression or unipolar depression. Major depressive disorder usually causes feelings of sadness, hopelessness, or helplessness. Some people with this disorder do not feel particularly sad but lose interest in doing things they used to enjoy (anhedonia). Major depressive disorder also can cause physical symptoms. It can interfere with work, school, relationships, and other normal everyday activities. The disorder varies in severity but is longer lasting and more serious than the sadness we all feel from time to time in our lives. Major depressive disorder often is triggered by stressful life events or major life changes. Examples of these triggers include divorce, loss of your job or home, a move, and the death of a family member or close friend. Sometimes this disorder occurs for no obvious reason at all. People who have family members with major depressive disorder or bipolar disorder are at higher risk for developing this disorder, with or without life stressors. Major depressive disorder can occur at any age. It may occur just once in your life (single episode major depressive disorder). It may occur multiple times (recurrent major depressive disorder). SYMPTOMS People with major depressive disorder have either anhedonia or depressed mood on nearly a daily basis for at least 2 weeks or longer. Symptoms of depressed mood include:  Feelings of sadness (blue or down in the dumps) or emptiness.  Feelings of hopelessness or helplessness.  Tearfulness or episodes of crying (may be observed by others).  Irritability (children and adolescents). In addition to depressed mood or anhedonia or both, people with this disorder have at least four of the following symptoms:  Difficulty sleeping or sleeping too much.   Significant change (increase or decrease) in appetite or weight.   Lack of energy or  motivation.  Feelings of guilt and worthlessness.   Difficulty concentrating, remembering, or making decisions.  Unusually slow movement (psychomotor retardation) or restlessness (as observed by others).   Recurrent wishes for death, recurrent thoughts of self-harm (suicide), or a suicide attempt. People with major depressive disorder commonly have persistent negative thoughts about themselves, other people, and the world. People with severe major depressive disorder may experiencedistorted beliefs or perceptions about the world (psychotic delusions). They also may see or hear things that are not real (psychotic hallucinations). DIAGNOSIS Major depressive disorder is diagnosed through an assessment by your health care provider. Your health care provider will ask aboutaspects of your daily life, such as mood,sleep, and appetite, to see if you have the diagnostic symptoms of major depressive disorder. Your health care provider may ask about your medical history and use of alcohol or drugs, including prescription medicines. Your health care provider also may do a physical exam and blood work. This is because certain medical conditions and the use of certain substances can cause major depressive disorder-like symptoms (secondary depression). Your health care provider also may refer you to a mental health specialist for further evaluation and treatment. TREATMENT It is important to recognize the symptoms of major depressive disorder and seek treatment. The following treatments can be prescribed for this disorder:   Medicine. Antidepressant medicines usually are prescribed. Antidepressant medicines are thought to correct chemical imbalances in the brain that are commonly associated with major depressive disorder. Other types of medicine may be added if the symptoms do not respond to antidepressant medicines alone or if psychotic delusions or hallucinations occur.  Talk therapy. Talk therapy can be  helpful in treating major depressive disorder by providing   support, education, and guidance. Certain types of talk therapy also can help with negative thinking (cognitive behavioral therapy) and with relationship issues that trigger this disorder (interpersonal therapy). A mental health specialist can help determine which treatment is best for you. Most people with major depressive disorder do well with a combination of medicine and talk therapy. Treatments involving electrical stimulation of the brain can be used in situations with extremely severe symptoms or when medicine and talk therapy do not work over time. These treatments include electroconvulsive therapy, transcranial magnetic stimulation, and vagal nerve stimulation.   This information is not intended to replace advice given to you by your health care provider. Make sure you discuss any questions you have with your health care provider.   Document Released: 07/13/2012 Document Revised: 04/08/2014 Document Reviewed: 07/13/2012 Elsevier Interactive Patient Education 2016 [employer]  

## 2015-06-15 NOTE — Progress Notes (Signed)
Pre visit review using our clinic review tool, if applicable. No additional management support is needed unless otherwise documented below in the visit note. 

## 2015-06-16 ENCOUNTER — Encounter: Payer: Self-pay | Admitting: Internal Medicine

## 2015-06-16 ENCOUNTER — Other Ambulatory Visit: Payer: Self-pay | Admitting: Internal Medicine

## 2015-06-16 MED ORDER — ALBUTEROL SULFATE (2.5 MG/3ML) 0.083% IN NEBU: 2.5000 mg | INHALATION_SOLUTION | RESPIRATORY_TRACT | Status: DC | PRN

## 2015-06-19 ENCOUNTER — Other Ambulatory Visit: Payer: Self-pay | Admitting: Internal Medicine

## 2015-06-19 ENCOUNTER — Encounter: Payer: Self-pay | Admitting: Internal Medicine

## 2015-06-19 ENCOUNTER — Telehealth: Payer: Self-pay

## 2015-06-19 ENCOUNTER — Encounter: Payer: Self-pay | Admitting: Nurse Practitioner

## 2015-06-19 ENCOUNTER — Other Ambulatory Visit: Payer: Self-pay | Admitting: Nurse Practitioner

## 2015-06-19 ENCOUNTER — Encounter: Payer: Medicare Other | Admitting: Nurse Practitioner

## 2015-06-19 MED ORDER — OSELTAMIVIR PHOSPHATE 75 MG PO CAPS: 75.0000 mg | ORAL_CAPSULE | Freq: Every day | ORAL | Status: DC

## 2015-06-19 NOTE — Telephone Encounter (Signed)
Prior auth for Diltiazem ER 420mg  sent to [facility].

## 2015-06-20 ENCOUNTER — Other Ambulatory Visit: Payer: Self-pay | Admitting: Oncology

## 2015-06-20 ENCOUNTER — Other Ambulatory Visit: Payer: Self-pay | Admitting: Cardiology

## 2015-06-20 ENCOUNTER — Telehealth: Payer: Self-pay

## 2015-06-20 ENCOUNTER — Other Ambulatory Visit: Payer: Self-pay | Admitting: Internal Medicine

## 2015-06-20 NOTE — Telephone Encounter (Signed)
Diltiazem ER 420mg  approved through 06/18/2016.

## 2015-06-21 NOTE — Telephone Encounter (Signed)
Ok x one refill on tramdol but needs ov with all meds in hand and med calendar to regroup re longterm rx in view of Pos Methacholine challenge > had intended to see back after this test but not done yet

## 2015-06-21 NOTE — Telephone Encounter (Signed)
Spoke with the pt and notified of recs per MW  Appt scheduled for 06/30/15  Pt aware to bring all meds and med cal  Rx was called to pharm x 1 only

## 2015-06-21 NOTE — Telephone Encounter (Signed)
MW please advise on refill. Thanks. 

## 2015-06-21 NOTE — Telephone Encounter (Signed)
Please advise, thanks.

## 2015-06-22 NOTE — Telephone Encounter (Signed)
REFILL 

## 2015-06-23 DIAGNOSIS — H539 Unspecified visual disturbance: Secondary | ICD-10-CM | POA: Diagnosis not present

## 2015-06-23 DIAGNOSIS — I639 Cerebral infarction, unspecified: Secondary | ICD-10-CM | POA: Diagnosis not present

## 2015-06-23 DIAGNOSIS — G932 Benign intracranial hypertension: Secondary | ICD-10-CM | POA: Diagnosis not present

## 2015-06-23 DIAGNOSIS — R51 Headache: Secondary | ICD-10-CM | POA: Diagnosis not present

## 2015-06-27 ENCOUNTER — Encounter: Payer: Self-pay | Admitting: Internal Medicine

## 2015-06-29 ENCOUNTER — Ambulatory Visit (INDEPENDENT_AMBULATORY_CARE_PROVIDER_SITE_OTHER): Payer: Medicare Other | Admitting: Internal Medicine

## 2015-06-29 ENCOUNTER — Encounter: Payer: Self-pay | Admitting: Internal Medicine

## 2015-06-29 ENCOUNTER — Telehealth: Payer: Self-pay | Admitting: Nurse Practitioner

## 2015-06-29 VITALS — BP 126/82 | HR 95 | Ht 67.0 in | Wt 201.4 lb

## 2015-06-29 DIAGNOSIS — J45991 Cough variant asthma: Secondary | ICD-10-CM | POA: Diagnosis not present

## 2015-06-29 DIAGNOSIS — R05 Cough: Secondary | ICD-10-CM

## 2015-06-29 DIAGNOSIS — R058 Other specified cough: Secondary | ICD-10-CM

## 2015-06-29 MED ORDER — TRAMADOL HCL 50 MG PO TABS: 50.0000 mg | ORAL_TABLET | ORAL | Status: DC | PRN

## 2015-06-29 MED ORDER — PREDNISONE 10 MG PO TABS
ORAL_TABLET | ORAL | Status: DC
Start: 2015-06-29 — End: 2015-07-18

## 2015-06-29 NOTE — Progress Notes (Signed)
Subjective:    Patient ID: Erica [name], female     DOB: 1975/09/10    MRN: [id number]    Brief patient profile:  34 yobf with asthma all her life much worse since around 07/2010 referred by Dr [name] to pulmonary clinic in Sept 2012 Hx of Breast Cancer 05/2008    History of Present Illness  12/18/2010 Initial pulmonary office eval on acei and  advair cc dtc asthma x 4 months but baseline = excess saba use "even when better" at least twice daily despite daily advair, worse at hs in fact  used saba 3 x last night.  On chemo /RT for breast ca but last rx was Jan 2012 well before asthma worse.  To er multiple times >  no better with singular added x 3 months. rec Stop Lisinopril Dulera 200 Take 2 puffs first thing in am and then another 2 puffs about 12 hours later.  Work on inhaler technique  Nexium 40 mg   Take 30-60 min before first meal of the day and Pepcid 20 mg one bedtime until return Only use your albuterol as a rescue medication to be used if you can't catch your breath by resting or doing a relaxed purse lip breathing pattern. The less you use it, the better it will work when you need it.  Prednisone 10 mg take  4 each am x 2 days,   2 each am x 2 days,  1 each am x2days and stop    01/03/2011 f/u ov/Erica [name] cc much better off ace and advair but still needing saba twice daily on avg. No  Purulent sputum. rec no change in rx/ rule of 2's reviewed     04/06/2013 NP Follow up and Med Review   We reviewed all her meds and organized them into a med calendar. She does have Medicaid but depends on samples. Did not bring all her meds with her today , says they are at home.  Says her breathing is the same since last ov w/ SOB, Has on /off wheezing, some chest heaviness, prod cough with yellow mucus. Good and bad days. Wears out easily .  Denies f/c/s, nausea, vomiting.  Did not stop singulair as recommended.  rec  Follow med calendar closely and bring to each visit > did not do  We are  referring you for a CPSTwith spirometry before and after> never done   04/18/2014 NP Follow up and Med Review / NP  Patient returns for a two-week follow-up and medication review. We reviewed all her medications organize them into a medication count with patient education It appears that she is taking her medications correctly, however, she has over 30 medications and has had several changes recently. Last visit, her [name] was decreased to the 162mcg says that her breathing was better on the 210mcg Patient had a chest x-ray last visit that showed no acute changes She underwent a CT sinus due to persistent cough , which was positive for sinusitis on the right She was treated with a ten-day course of Avelox, she has one dose left Does feel some better but still has cough . She denies any hemoptysis, orthopnea, PND or leg swelling  Patient has upcoming sleep study. Next week for suspected underlying sleep apnea with snoring and daytime sleepiness rec Refer to ENT for sinusitis and chronic cough > never saw one> f/u sinus CT neg 07/26/14  Increase Dulera 200 2 puffs Twice daily   Follow med calendar closely  and bring to each visit.     08/01/2014 f/u ov/Erica [name] re:  Asthma plus noct "gasping"  Chief Complaint  Patient presents with  . Follow-up    doing well.  no complaints.  not doing well x one year with noct / early  4am with cough/ gasping for air almost always goes to neb and then can't go back to sleep even 30 min later better Daytime in chest tightness resolves  completely while on prednisone last took one month prior to OV  And doesn't have it now but has exact same noct symptoms and overusing saba  Doe x mailbox and back to house out of breath and coughing  >>change dulera 200 to 100   08/30/2014 NP Follow up and Med review : Asthma Patient presents for a one-month follow-up We reviewed all her medications organize them into a medication count with patient education Patient has had  multiple medication changes recently She has been having difficulty with resistant hypertension and visual changes. Recently started on primidone and spironolactone . However, has not started this as of yet That she needs to go pick this up at the pharmacy. Neuro notes reviewed , has been referred to NS . B/p is elevated again at todays visit, discussed follow up with PCP for b/p management.  She tried to decrease her Dulera as recommended from last ov with Dr. Melvyn Novas    however, was unable to and is back on 200 dose .  Says overall her breathing is doing ok  No flare of cough or wheezing  Does have a lot of drainage in throat . Discussed starting on chlortrimeton.  rec Continue Dulera 200 2 puffs Twice daily  , rinse after use  Follow med calendar closely and bring to each visit.  Follow up with Primary MD concerning elevated blood pressure    10/05/2014 f/u ov/Erica [name] re: asthma with component of uacs/ no med calendar  Chief Complaint  Patient presents with  . Follow-up    Here to discuss meds- Dulera not on her drug formulary. Pt states breathing is doing well and denies any new co's today.  turns out using neb at least once every 24 h, esp between 2 and 4 am when wakes up with severe dry cough x months/ pred helps some but never eliminated noct cough rec symbicort 160 Take 2 puffs first thing in am and then another 2 puffs about 12 hours later.  Try taking the tramadol around 2 am to see if helps with the early am cough/ wheeze  See calendar for specific medication instructions and bring it back for each and every office visit  Separating the top medications from the bottom group is fundamental to providing you adequate care going forward.   Please schedule a follow up office visit in 2 weeks, sooner if needed with all medications in 2 separate bags    10/19/2014 f/u ov/Erica [name] re:  Asthma vs uacs/ not using med calendar correclty  Chief Complaint  Patient presents with  . Follow-up     Cough has changed sicnce starting symbicort- prod cough with "dirty" sputum. She states she feels that she can tell when her PM dose is due b/c she starts feeling SOB. She is using proair once per day on average.   not using symbicort 160 correctly  rec Prednisone 10 mg take  4 each am x 2 days,  2 each am x 2 days,  1 each am x 2 days and stop  Bystolic  10 mg take twice daily until return  Take 2 chlortrimeton at bedtime and 2 more 4 hours later  Continue symbicort 160 Take 2 puffs first thing in am and then another 2 puffs about 12 hours later.  Only use your albuterol as rescue  Only use nebulizer if you try proventil first and it fails to work    11/08/2014 Follow up : Asthma /UAC   Pt returns for follow up .  Last ov with asthma flare , tx with pred taper  She is feeling better Breathing is doing better and back to her baseline  Still has dry cough but some better.  We reviewed all her meds and organized them into a med calendar with pt education  Appears to be taking correctly although she is on over 30 medications .  B/p is improved on current regimen.  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 Follow-up    C/o increase SOB, prod cough (tan color phlem), wheezing, chest tx, nasal cong  worse x one week but at baseline doe x more than slow walk and neb q day around 4 am as the hfa not helping with noct symptoms no better even on prednisone rec Prednisone 10 mg take  4 each am x 2 days,  2 each am x 2 days,  1 each am x 2 days and stop      03/24/2015 f/u  Pulmonary office visit/ Erica [name]  Asthma vs vcd Chief Complaint  Patient presents with  . Follow-up    Pt c/o increased cough with tan mucus, wheeze and SOB. Pt reports increase in symptoms with weather changes. Pt has used albuterol HFA with some symptom relief. Pt denies f/n/v/d.   2 weeks prior to OV  Noted needing the noct neb again  where this problem had resolved on pred  then 2 d after that bad cough p [employer] shopping and worse since then but still comfortable p saba rx rec Prednisone 10 mg take  4 each am x 2 days,   2 each am x 2 days,  1 each am x 2 days and stop  For cough use mucinex dm up to 1200 mg every 12 hours and the flutter valve as much as possible and if still can't control the cough > tramadol as per action plan on med calendar     06/29/2015  f/u ov/Erica [name] re: asthma and unrelated cough / no med calendar  Chief Complaint  Patient presents with  . Follow-up    Cough has not improved since she was last seen.  She is using albuterol inhaler 2 x daily and neb with albuterol 1 x daily on average.   MCT did not reproduce the cough but the sob and wheeze  Unless w/in two weeks of taking prednisone, resumes 4 am wheezing/ gasping for air> almost every night needs to back up proair with neb  On dex daily taken before supper  avg use of tramadol is 6 x in 24 h unless on pred   No obvious patterns in day to day or daytime variability or assoc excess/ purulent sputum or mucus plugs    cp or chest tightness, subjective wheeze or overt sinus or hb symptoms. No unusual exp hx or h/o childhood pna/ asthma or knowledge of premature birth.  Sleeping ok without nocturnal  or early am exacerbation  of respiratory  c/o's  or need for noct saba. Also denies any obvious fluctuation of symptoms with weather or environmental changes or other aggravating or alleviating factors except as outlined above   Current Medications, Allergies, Complete Past Medical History, Past Surgical History, Family History, and Social History were reviewed in [employer] record.  ROS  The following are not active complaints unless bolded sore throat, dysphagia, dental problems, itching, sneezing,  nasal congestion or excess/ purulent secretions, ear ache,   fever, chills, sweats, unintended wt loss, classically pleuritic or  exertional cp, hemoptysis,  orthopnea pnd or leg swelling, presyncope, palpitations, abdominal pain, anorexia, nausea, vomiting, diarrhea  or change in bowel or bladder habits, change in stools or urine, dysuria,hematuria,  rash, arthralgias, visual complaints, headache, numbness, weakness or ataxia or problems with walking or coordination,  change in mood/affect or memory.                    Objective:   Physical Exam  Obese bf nad  vital signs reviewed  Wt 213  12/18/10 > 01/03/2011  214 > 01/31/2011  206 >  09/11/2012 209 >208 6/19 > 210 09/25/2012 > 11/20/2012 211 >208 12/07/12> 01/12/2013 209 > 02/09/2013 210 >207 04/06/2013 > 07/14/2013  213>212 07/28/2013 >204 01/26/2014> 04/04/2014 206  >205 04/18/2014 >  07/18/2014 201 >  08/01/14 201 >198 08/30/2014 > 10/05/14  195 > 10/19/2014 192 > 191 11/08/2014 > 02/10/2015  198 > 03/24/2015   198 > 06/29/2015   201   HEENT: nl dentition, turbinates, and orophanx. Nl external ear canals without cough reflex,     NECK :  without JVD/Nodes/TM/ nl carotid upstrokes bilaterally   LUNGS: lungs clear bilaterally with good air movement  CV:  RRR  no s3 or murmur or increase in P2, no edema   ABD:  soft and nontender with nl excursion in the supine position. No bruits or organomegaly, bowel sounds nl  MS:  warm without deformities, calf tenderness, cyanosis or clubbing         CXR PA and Lateral:   03/24/2015 :    I personally reviewed images and agree with radiology impression as follows:    The heart size and mediastinal contours are within normal limits. Both lungs are clear. The visualized skeletal structures are unremarkable.

## 2015-06-29 NOTE — Assessment & Plan Note (Addendum)
MRI 05/18/15 > Paranasal sinuses are clear   Classic Upper airway cough syndrome, so named because it's frequently impossible to sort out how much is  CR/sinusitis with freq throat clearing (which can be related to primary GERD)   vs  causing  secondary (" extra esophageal")  GERD from wide swings in gastric pressure that occur with throat clearing, often  promoting self use of mint and menthol lozenges that reduce the lower esophageal sphincter tone and exacerbate the problem further in a cyclical fashion.   These are the same pts (now being labeled as having "irritable larynx syndrome" by some cough centers) who not infrequently have a history of having failed to tolerate ace inhibitors,  dry powder inhalers or biphosphonates or report having atypical reflux symptoms that don't respond to standard doses of PPI , and are easily confused as having aecopd or asthma flares by even experienced allergists/ pulmonologists.   Will try max rx for gerd/ pnds with 1st gen H1/ 6 d prednisone and off

## 2015-06-29 NOTE — Assessment & Plan Note (Signed)
-   PFT's 01/31/2011 FEV1  1.76 (60% ) ratio 68 and no better p B2,  DLCO 70% -med review 09/25/2012 > Dulera drug assistance  paperwork given    - HFA 90% 11/20/2012 .  -Med calendar 12/07/12 , 04/06/2013 , 04/18/2014,, 08/30/2014 , 11/08/2014  - PFT's 01/12/2013  1.73 (65%)  Ratio 73 and no better p B2, DLCO 86% - trial off singulair 02/08/13 >>>did not stop  - 04/05/2014  trial of dulera 100 instead of 200 > worse  -04/18/2014 increase Dulera 200  - 08/01/2014 p extensive coaching HFA effectiveness =    90% - 08/01/2014  Walked RA x 2laps @ 185 ft each stopped due to cough > sob/ no desat moderate pace  - spirometry 08/01/2014  FEV1  1.87 (70%) ratio 78  fef 25-75 53%  - 02/10/2015  Walked RA x 3 laps @ 185 ft each stopped due to   - spirometry 02/10/2015  flattening of top portion of f/v loop only   - 02/10/2015   Walked RA  2 laps @ 185 ft each stopped due to cough > sob/ slow pace   -  NO 03/24/2015   =  16 - Methacholine challenge 04/04/2015   POS asthma = wheeze/sob but no coughing fits > added qvar 80 2bid 06/29/2015 >>>   06/29/2015  extensive coaching HFA effectiveness =    90%   I had an extended discussion with the patient reviewing all relevant studies completed to date and  lasting 25 minutes of a 40  minute visit on the following ongoing concerns:   1) prednisone response is consistent/convincing re benefit but should not need systemic rx with such good hfa assuming actually using meds  2) try add qvar 80 2bid then return for a trust but verify visit (count the qvar)    3) the asthma and the cough may not be related > see cough     Each maintenance medication was reviewed in detail including most importantly the difference between maintenance and as needed and under what circumstances the prns are to be used. This was done in the context of a medication calendar review which provided the patient with a user-friendly unambiguous mechanism for medication administration and reconciliation and  provides an action plan for all active problems. It is critical that this be shown to every doctor  for modification during the office visit if necessary so the patient can use it as a working document.

## 2015-06-29 NOTE — Telephone Encounter (Signed)
cld pt to r/s surviourship program appt-*left message for pt to call us back to r/s

## 2015-06-29 NOTE — Patient Instructions (Addendum)
Prednisone 10 mg take  4 each am x 2 days,   2 each am x 2 days,  1 each am x 2 days and stop   Qvar 80 Take 2 puffs first thing in am and then another 2 puffs about 12 hours later.   Change dexilant Take 30-60 min before first meal of the day and Add Pepcid 20 mg at bedtime   For drainage / throat tickle try take CHLORPHENIRAMINE  4 mg - take one every 4 hours as needed - available over the counter- may cause drowsiness so start with just a bedtime dose or two and see how you tolerate it before trying in daytime    See Tammy NP w/in 2 weeks(or next available after than)  with all your medications, even over the counter meds, separated in two separate bags, the ones you take no matter what vs the ones you stop once you feel better and take only as needed when you feel you need them.   Tammy  will generate for you a new user friendly medication calendar that will put [place] all on the same page re: your medication use.     Without this process, it simply isn't possible to assure that we are providing  your outpatient care  with  the attention to detail we feel you deserve.   If we cannot assure that you're getting that kind of care,  then we cannot manage your problem effectively from this clinic.  Once you have seen Tammy and we are sure that we're all on the same page with your medication use she will arrange follow up with me.  Add:  Count the qvar 80 at f/u (given one sample = 120 pffs)

## 2015-06-30 DIAGNOSIS — H539 Unspecified visual disturbance: Secondary | ICD-10-CM | POA: Diagnosis not present

## 2015-06-30 DIAGNOSIS — R51 Headache: Secondary | ICD-10-CM | POA: Diagnosis not present

## 2015-06-30 DIAGNOSIS — I639 Cerebral infarction, unspecified: Secondary | ICD-10-CM | POA: Diagnosis not present

## 2015-06-30 DIAGNOSIS — G932 Benign intracranial hypertension: Secondary | ICD-10-CM | POA: Diagnosis not present

## 2015-07-06 ENCOUNTER — Ambulatory Visit: Payer: Medicare Other

## 2015-07-12 ENCOUNTER — Encounter: Payer: Self-pay | Admitting: *Deleted

## 2015-07-13 ENCOUNTER — Telehealth: Payer: Self-pay | Admitting: Neurology

## 2015-07-13 NOTE — Telephone Encounter (Signed)
Patient said that she did see a neurosurgeon at [facility].  They did some special MRI and she is waiting for the results.

## 2015-07-13 NOTE — Telephone Encounter (Signed)
VM-PT left a message she would like a call back/Dawn CB# [phone]

## 2015-07-13 NOTE — Telephone Encounter (Signed)
Noted  

## 2015-07-17 ENCOUNTER — Ambulatory Visit: Payer: Medicare Other | Admitting: Neurology

## 2015-07-18 ENCOUNTER — Ambulatory Visit (INDEPENDENT_AMBULATORY_CARE_PROVIDER_SITE_OTHER): Payer: Medicare Other | Admitting: Adult Health

## 2015-07-18 ENCOUNTER — Encounter: Payer: Self-pay | Admitting: Adult Health

## 2015-07-18 VITALS — BP 154/96 | HR 107 | Temp 98.3°F | Ht 66.0 in | Wt 202.0 lb

## 2015-07-18 DIAGNOSIS — J45991 Cough variant asthma: Secondary | ICD-10-CM

## 2015-07-18 MED ORDER — ALBUTEROL SULFATE (2.5 MG/3ML) 0.083% IN NEBU: 2.5000 mg | INHALATION_SOLUTION | RESPIRATORY_TRACT | Status: DC | PRN

## 2015-07-18 MED ORDER — MOMETASONE FUROATE 50 MCG/ACT NA SUSP: 2.0000 | Freq: Two times a day (BID) | NASAL | Status: DC

## 2015-07-18 NOTE — Assessment & Plan Note (Signed)
Recent flare now improved  Patient's medications were reviewed today and patient education was given. Computerized medication calendar was adjusted/completed   Plan  Cont on current regimen  follow up Dr. Melvyn Novas  In 4 months and As needed

## 2015-07-18 NOTE — Progress Notes (Signed)
Chart and office note reviewed in detail  > agree with a/p as outlined    

## 2015-07-18 NOTE — Progress Notes (Signed)
Subjective:    Patient ID: Erica [name], female     DOB: 1975/09/10    MRN: [id number]    Brief patient profile:  34 yobf with asthma all her life much worse since around 07/2010 referred by Dr [name] to pulmonary clinic in Sept 2012 Hx of Breast Cancer 05/2008    History of Present Illness  12/18/2010 Initial pulmonary office eval on acei and  advair cc dtc asthma x 4 months but baseline = excess saba use "even when better" at least twice daily despite daily advair, worse at hs in fact  used saba 3 x last night.  On chemo /RT for breast ca but last rx was Jan 2012 well before asthma worse.  To er multiple times >  no better with singular added x 3 months. rec Stop Lisinopril Dulera 200 Take 2 puffs first thing in am and then another 2 puffs about 12 hours later.  Work on inhaler technique  Nexium 40 mg   Take 30-60 min before first meal of the day and Pepcid 20 mg one bedtime until return Only use your albuterol as a rescue medication to be used if you can't catch your breath by resting or doing a relaxed purse lip breathing pattern. The less you use it, the better it will work when you need it.  Prednisone 10 mg take  4 each am x 2 days,   2 each am x 2 days,  1 each am x2days and stop    01/03/2011 f/u ov/Wert cc much better off ace and advair but still needing saba twice daily on avg. No  Purulent sputum. rec no change in rx/ rule of 2's reviewed     04/06/2013 NP Follow up and Med Review   We reviewed all her meds and organized them into a med calendar. She does have Medicaid but depends on samples. Did not bring all her meds with her today , says they are at home.  Says her breathing is the same since last ov w/ SOB, Has on /off wheezing, some chest heaviness, prod cough with yellow mucus. Good and bad days. Wears out easily .  Denies f/c/s, nausea, vomiting.  Did not stop singulair as recommended.  rec  Follow med calendar closely and bring to each visit > did not do  We are  referring you for a CPSTwith spirometry before and after> never done   04/18/2014 NP Follow up and Med Review / NP  Patient returns for a two-week follow-up and medication review. We reviewed all her medications organize them into a medication count with patient education It appears that she is taking her medications correctly, however, she has over 30 medications and has had several changes recently. Last visit, her [name] was decreased to the 162mcg says that her breathing was better on the 210mcg Patient had a chest x-ray last visit that showed no acute changes She underwent a CT sinus due to persistent cough , which was positive for sinusitis on the right She was treated with a ten-day course of Avelox, she has one dose left Does feel some better but still has cough . She denies any hemoptysis, orthopnea, PND or leg swelling  Patient has upcoming sleep study. Next week for suspected underlying sleep apnea with snoring and daytime sleepiness rec Refer to ENT for sinusitis and chronic cough > never saw one> f/u sinus CT neg 07/26/14  Increase Dulera 200 2 puffs Twice daily   Follow med calendar closely  and bring to each visit.     08/01/2014 f/u ov/Wert re:  Asthma plus noct "gasping"  Chief Complaint  Patient presents with  . Follow-up    doing well.  no complaints.  not doing well x one year with noct / early  4am with cough/ gasping for air almost always goes to neb and then can't go back to sleep even 30 min later better Daytime in chest tightness resolves  completely while on prednisone last took one month prior to OV  And doesn't have it now but has exact same noct symptoms and overusing saba  Doe x mailbox and back to house out of breath and coughing  >>change dulera 200 to 100   08/30/2014 NP Follow up and Med review : Asthma Patient presents for a one-month follow-up We reviewed all her medications organize them into a medication count with patient education Patient has had  multiple medication changes recently She has been having difficulty with resistant hypertension and visual changes. Recently started on primidone and spironolactone . However, has not started this as of yet That she needs to go pick this up at the pharmacy. Neuro notes reviewed , has been referred to NS . B/p is elevated again at todays visit, discussed follow up with PCP for b/p management.  She tried to decrease her Dulera as recommended from last ov with Dr. Melvyn Novas    however, was unable to and is back on 200 dose .  Says overall her breathing is doing ok  No flare of cough or wheezing  Does have a lot of drainage in throat . Discussed starting on chlortrimeton.  rec Continue Dulera 200 2 puffs Twice daily  , rinse after use  Follow med calendar closely and bring to each visit.  Follow up with Primary MD concerning elevated blood pressure    10/05/2014 f/u ov/Wert re: asthma with component of uacs/ no med calendar  Chief Complaint  Patient presents with  . Follow-up    Here to discuss meds- Dulera not on her drug formulary. Pt states breathing is doing well and denies any new co's today.  turns out using neb at least once every 24 h, esp between 2 and 4 am when wakes up with severe dry cough x months/ pred helps some but never eliminated noct cough rec symbicort 160 Take 2 puffs first thing in am and then another 2 puffs about 12 hours later.  Try taking the tramadol around 2 am to see if helps with the early am cough/ wheeze  See calendar for specific medication instructions and bring it back for each and every office visit  Separating the top medications from the bottom group is fundamental to providing you adequate care going forward.   Please schedule a follow up office visit in 2 weeks, sooner if needed with all medications in 2 separate bags    10/19/2014 f/u ov/Wert re:  Asthma vs uacs/ not using med calendar correclty  Chief Complaint  Patient presents with  . Follow-up     Cough has changed sicnce starting symbicort- prod cough with "dirty" sputum. She states she feels that she can tell when her PM dose is due b/c she starts feeling SOB. She is using proair once per day on average.   not using symbicort 160 correctly  rec Prednisone 10 mg take  4 each am x 2 days,  2 each am x 2 days,  1 each am x 2 days and stop  Bystolic  10 mg take twice daily until return  Take 2 chlortrimeton at bedtime and 2 more 4 hours later  Continue symbicort 160 Take 2 puffs first thing in am and then another 2 puffs about 12 hours later.  Only use your albuterol as rescue  Only use nebulizer if you try proventil first and it fails to work    11/08/2014 Follow up : Asthma /UAC   Pt returns for follow up .  Last ov with asthma flare , tx with pred taper  She is feeling better Breathing is doing better and back to her baseline  Still has dry cough but some better.  We reviewed all her meds and organized them into a med calendar with pt education  Appears to be taking correctly although she is on over 30 medications .  B/p is improved on current regimen.  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 Follow-up    C/o increase SOB, prod cough (tan color phlem), wheezing, chest tx, nasal cong  worse x one week but at baseline doe x more than slow walk and neb q day around 4 am as the hfa not helping with noct symptoms no better even on prednisone rec Prednisone 10 mg take  4 each am x 2 days,  2 each am x 2 days,  1 each am x 2 days and stop      03/24/2015 f/u  Pulmonary office visit/ Wert  Asthma vs vcd Chief Complaint  Patient presents with  . Follow-up    Pt c/o increased cough with tan mucus, wheeze and SOB. Pt reports increase in symptoms with weather changes. Pt has used albuterol HFA with some symptom relief. Pt denies f/n/v/d.   2 weeks prior to OV  Noted needing the noct neb again  where this problem had resolved on pred  then 2 d after that bad cough p [employer] shopping and worse since then but still comfortable p saba rx rec Prednisone 10 mg take  4 each am x 2 days,   2 each am x 2 days,  1 each am x 2 days and stop  For cough use mucinex dm up to 1200 mg every 12 hours and the flutter valve as much as possible and if still can't control the cough > tramadol as per action plan on med calendar    06/29/2015  f/u ov/Wert re: asthma and unrelated cough / no med calendar  Chief Complaint  Patient presents with  . Follow-up    Cough has not improved since she was last seen.  She is using albuterol inhaler 2 x daily and neb with albuterol 1 x daily on average.   MCT did not reproduce the cough but the sob and wheeze  Unless w/in two weeks of taking prednisone, resumes 4 am wheezing/ gasping for air> almost every night needs to back up proair with neb  On dex daily taken before supper  avg use of tramadol is 6 x in 24 h unless on pred  >>pred taper , QVAR added to Symbicort   07/18/2015 Follow up : Asthma vs Cough  Pt returns for 2 week follow up . Seen last ov with Asthma flare , tx w/ steroid taper.  She is feeling better. Still has some lingering cough on/off.  QVAR was added to her symbicort .  We reviewed all her meds and organized them  into a med calendar with pt education.  Appears to be taking correctly .  Denies chest pain, orthopnea, edema or fever.     Current Medications, Allergies, Complete Past Medical History, Past Surgical History, Family History, and Social History were reviewed in [employer] record.  ROS  The following are not active complaints unless bolded sore throat, dysphagia, dental problems, itching, sneezing,  nasal congestion or excess/ purulent secretions, ear ache,   fever, chills, sweats, unintended wt loss, classically pleuritic or exertional cp, hemoptysis,  orthopnea pnd or leg swelling, presyncope, palpitations,  abdominal pain, anorexia, nausea, vomiting, diarrhea  or change in bowel or bladder habits, change in stools or urine, dysuria,hematuria,  rash, arthralgias, visual complaints, headache, numbness, weakness or ataxia or problems with walking or coordination,  change in mood/affect or memory.                    Objective:   Physical Exam  Obese bf nad  vital signs reviewed Filed Vitals:   07/18/15 1425  BP: 154/96  Pulse: 107  Temp: 98.3 F (36.8 C)  TempSrc: Oral  Height: 5\' 6"  (1.676 m)  Weight: 202 lb (91.627 kg)  SpO2: 97%     Wt 213  12/18/10 > 01/03/2011  214 > 01/31/2011  206 >  09/11/2012 209 >208 6/19 > 210 09/25/2012 > 11/20/2012 211 >208 12/07/12> 01/12/2013 209 > 02/09/2013 210 >207 04/06/2013 > 07/14/2013  213>212 07/28/2013 >204 01/26/2014> 04/04/2014 206  >205 04/18/2014 >  07/18/2014 201 >  08/01/14 201 >198 08/30/2014 > 10/05/14  195 > 10/19/2014 192 > 191 11/08/2014 > 02/10/2015  198 > 03/24/2015   198 > 06/29/2015   201   HEENT: nl dentition, turbinates, and orophanx. Nl external ear canals without cough reflex,     NECK :  without JVD/Nodes/TM/ nl carotid upstrokes bilaterally   LUNGS: lungs clear bilaterally with good air movement  CV:  RRR  no s3 or murmur or increase in P2, no edema   ABD:  soft and nontender with nl excursion in the supine position. No bruits or organomegaly, bowel sounds nl  MS:  warm without deformities, calf tenderness, cyanosis or clubbing         CXR PA and Lateral:   03/24/2015 :      The heart size and mediastinal contours are within normal limits. Both lungs are clear. The visualized skeletal structures are unremarkable.   Mariusz Jubb NP-C  [facility]  07/18/2015

## 2015-07-18 NOTE — Patient Instructions (Signed)
Follow med calendar closely and bring to each visit.  Follow up Dr. Wert  In 4 months and As needed     

## 2015-07-18 NOTE — Addendum Note (Signed)
Addended by: Osa Craver on: 07/18/2015 03:41 PM   Modules accepted: Orders, Medications

## 2015-07-19 ENCOUNTER — Telehealth: Payer: Self-pay | Admitting: Internal Medicine

## 2015-07-19 DIAGNOSIS — J45991 Cough variant asthma: Secondary | ICD-10-CM

## 2015-07-19 MED ORDER — ALBUTEROL SULFATE (2.5 MG/3ML) 0.083% IN NEBU: INHALATION_SOLUTION | RESPIRATORY_TRACT | Status: DC

## 2015-07-19 NOTE — Telephone Encounter (Signed)
Prescription corrected and faxed to [place]. Nothing further needed.

## 2015-07-21 ENCOUNTER — Encounter: Payer: Self-pay | Admitting: Neurology

## 2015-07-21 ENCOUNTER — Other Ambulatory Visit (INDEPENDENT_AMBULATORY_CARE_PROVIDER_SITE_OTHER): Payer: Medicare Other

## 2015-07-21 ENCOUNTER — Ambulatory Visit (INDEPENDENT_AMBULATORY_CARE_PROVIDER_SITE_OTHER): Payer: Medicare Other | Admitting: Neurology

## 2015-07-21 VITALS — BP 190/90 | HR 127 | Ht 67.0 in | Wt 204.1 lb

## 2015-07-21 DIAGNOSIS — G932 Benign intracranial hypertension: Secondary | ICD-10-CM | POA: Diagnosis not present

## 2015-07-21 DIAGNOSIS — R29898 Other symptoms and signs involving the musculoskeletal system: Secondary | ICD-10-CM

## 2015-07-21 DIAGNOSIS — I10 Essential (primary) hypertension: Secondary | ICD-10-CM | POA: Diagnosis not present

## 2015-07-21 DIAGNOSIS — T451X5A Adverse effect of antineoplastic and immunosuppressive drugs, initial encounter: Secondary | ICD-10-CM

## 2015-07-21 DIAGNOSIS — G620 Drug-induced polyneuropathy: Secondary | ICD-10-CM | POA: Diagnosis not present

## 2015-07-21 LAB — BASIC METABOLIC PANEL
BUN: 9 mg/dL (ref 6–23)
CHLORIDE: 103 meq/L (ref 96–112)
CO2: 31 mEq/L (ref 19–32)
Calcium: 9.6 mg/dL (ref 8.4–10.5)
Creatinine, Ser: 0.92 mg/dL (ref 0.40–1.20)
GFR: 86.16 mL/min (ref >60.00)
GLUCOSE: 116 mg/dL — AB (ref 70–99)
POTASSIUM: 3.5 meq/L (ref 3.5–5.1)
Sodium: 142 mEq/L (ref 135–145)

## 2015-07-21 NOTE — Patient Instructions (Addendum)
1.  Physical therapy for left arm strengthening 2.  Check BMP 3.  Continue diamox 1000 three times daily and topiramate 25mg  twice daily  Return to clinic in 3 months

## 2015-07-21 NOTE — Progress Notes (Signed)
Follow-up Visit   Date: 07/21/2015    Erica [name] MRN: [id number] DOB: [birth year]   Interim History: Erica [name] is a 40 y.o. 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    History of present illness: Starting in 2010 after her second course of chemotherapy she developed numbness/tingling of the arm and legs, described as her hands are asleep. She has burning sensation and shaking of her hands. Activity worsens pain and she has difficulty with fine motor tasks such as buttoning, tying shoe laces, and braiding her daughter's hair. She falls about once per month because her legs give out. She was started on Lyrica 50mg  TID around March which has helped slightly.  This has been titrated to [place] 200mg  TID and lidocaine ointment was added which provides adequate relief.   She history of pseudotumor cerebri and takes diamox 500mg  BID. 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 She had lumbar puncture performed in the seated position with documented OP of 43 and 32cc of fluid removed. She had a second lumbar puncture in 2014 at which time OP was 18 mmHg.   She continues to have 1-2 elective high volume taps annually for headaches.   She reports having spells of syncope, which seems to only occur when she is sitting. There is loss of consciousness, lasting only a few seconds.  Following the spells, there is no fatigue, confusion, or weakness. On one ocassion, she felt herself leaning to the side and caught herself when at therapy. She is unaware of the spells.  Routine EEG was normal.  MRI/V brain has been  normal.   In 2016, her ophthalmologist, Dr. Vista Mink noticed new findings of severe left temporal visual field deficit involving her left eye (good eye). Her imaging did not disclose any intracranial abnormality to account for symptoms.  She underwent LP with OP of 27cm of water.  Since her LP, she reports feeling as if her vision is better and eyes don't feel as foggy anymore.   She was evaluated by neurosurgery for shunt evaluation, but because of no papilledema, did not feel that she would be a good candidate.  She had another LP in November 2016, again with OP of 27.  - UPDATE 05/18/2015:  She was doing well until last week, when she again began noticing pressure behind her right eye and she feels that she needs another tap.  She did not have her eye evaluation because of a change in her insurance.  She is tearful today and frustrated with all of her medical problems.  Her blood pressure was also elevated today in the 190s, rechecked it reduced to 160s.    - UPDATE 07/21/2015:  She was seen by neurosurgery at [facility] who discussed risks and benefits of VP shunt and she is undecided at this time.  There have been no new vision problems and her headaches are well-controlled.  She continues to be on diamox 1000mg  TID and topiramate 25mg  BID.  Lasix was stopped by her PCP due to persistently low potassium.    She has not started physical therapy for her right arm weakness, but has not noticed any worsening either.  No new complaints today.   Medications:  Current Outpatient Prescriptions on File Prior to Visit  Medication Sig Dispense Refill  . acetaZOLAMIDE (DIAMOX) 250 MG tablet Take 4 tablets (1,000 mg total) by mouth 3 (three) times daily. 400 tablet 11  . albuterol (PROVENTIL HFA;VENTOLIN HFA) 108 (90 BASE) MCG/ACT inhaler Inhale 2 puffs into the lungs every 4 (four) hours as needed for wheezing or shortness of breath (((PLAN A))). (Patient taking differently: Inhale 2 puffs into the lungs every 4 (four)  hours as needed for wheezing or shortness of breath (((PLAN B))). ) 18 g 5  . albuterol (PROVENTIL) (2.5 MG/3ML) 0.083% nebulizer solution Take 79ml every 4 hours and as needed for wheezing or shortness of breath. Dx: J45.99 300 mL 1  . amLODipine (NORVASC) 10 MG tablet Take 10 mg by mouth every morning.    . beclomethasone (QVAR) 80 MCG/ACT inhaler Inhale 2 puffs into the lungs 2 (two) times daily.    . Biotin 10 MG TABS Take 1 tablet by mouth every morning.     Marland Kitchen BYSTOLIC 10 MG tablet Take 1 tablet (10 mg total) by mouth daily. (Patient taking differently: Take 20 mg by mouth every morning. ) 90 tablet 3  . chlorpheniramine (CHLOR-TRIMETON) 4 MG tablet Take 8 mg by mouth at bedtime.    . cloNIDine (CATAPRES) 0.3 MG tablet Take 1 tablet by mouth 2 (two) times daily.  2  . dexlansoprazole (DEXILANT) 60 MG capsule Take 60 mg by mouth daily before breakfast.     . dextromethorphan-guaiFENesin (MUCINEX DM) 30-600 MG per 12 hr tablet Take 1 tablet by mouth 2 (two) times daily as needed (for cough, congestion, and thick mucus).     Marland Kitchen diltiazem (CARDIZEM LA) 420 MG 24 hr tablet Take 1 tablet (420 mg total) by mouth daily. 90 tablet 3  . DULoxetine (CYMBALTA) 30 MG capsule Take 1 capsule (30 mg total) by mouth daily. (Patient taking differently: Take 30 mg by mouth every morning. ) 30 capsule 2  . EPINEPHrine 0.3 mg/0.3 mL IJ SOAJ injection Inject 0.3 mLs (0.3 mg total) into the muscle once. (Patient taking differently: Use as directed for allergic reaction) 2 Device 1  . fluocinonide-emollient (LIDEX-E) 0.05 % cream Apply 1 application topically 2 (two) times daily. 60 g 1  . hydrALAZINE (APRESOLINE) 100 MG tablet Take 1 tablet (100 mg total) by mouth 3 (three) times daily. 90 tablet 11  . hydrochlorothiazide (MICROZIDE) 12.5 MG capsule TAKE 1 CAPSULE(12.5 MG) BY MOUTH DAILY (Patient taking differently: TAKE 1 CAPSULE(12.5 MG) BY MOUTH EVERY MORNING) 90 capsule 1  . isosorbide mononitrate (IMDUR) 60 MG 24  hr tablet Take 1 tablet (60 mg total) by mouth daily. (Patient taking differently: Take 60 mg by mouth every morning. ) 30 tablet 11  . LORazepam (ATIVAN) 0.5 MG tablet TAKE ONE TABLET BY MOUTH EVERY 8 HOURS AS NEEDED FOR ANXIETY 90 tablet 0  . metoCLOPramide (REGLAN) 10 MG tablet Take 10 mg by mouth 4 (four) times daily.    . mometasone (NASONEX) 50 MCG/ACT nasal spray Place 2 sprays into the nose 2 (two) times daily. 17 g 5  . montelukast (SINGULAIR) 10 MG tablet TAKE 1 TABLET BY MOUTH EVERY NIGHT AT BEDTIME 90 tablet 1  . nitroGLYCERIN (NITROSTAT) 0.4 MG SL tablet Place 1 tablet (0.4 mg total) under the tongue every 5 (five) minutes as needed for chest pain. 90 tablet 3  . ondansetron (ZOFRAN-ODT) 4 MG disintegrating tablet DISSOLVE ONE TABLET ON THE TONGUE EVERY 6 HOURS AS NEEDED FOR NAUSEA  AND VOMITING 20 tablet 0  . OXYGEN Use CPAP at bedtime    . potassium chloride SA (K-DUR,KLOR-CON) 20 MEQ tablet Take 20 mEq by mouth 3 (three) times daily.    . pregabalin (LYRICA) 200 MG capsule Take 1 capsule (200 mg total) by mouth 3 (three) times daily. 90 capsule 5  . primidone (MYSOLINE) 50 MG tablet Take 0.5 tablets (25 mg total) by mouth at bedtime. (Patient taking differently: Take 25 mg by mouth every morning. ) 30 tablet 5  . Respiratory Therapy Supplies (FLUTTER) DEVI Use as directed 1 each 0  . spironolactone (ALDACTONE) 25 MG tablet Take 1 tablet (25 mg total) by mouth daily. NEED OV. (Patient taking differently: TAKE 1 TABLET BY MOUTH IN THE MORNING AND 2 TABLETS BY MOUTH IN THE EVENING) 90 tablet 0  . SYMBICORT 160-4.5 MCG/ACT inhaler INHALE 2 PUFFS BY MOUTH TWICE DAILY 10.2 g 5  . topiramate (TOPAMAX) 25 MG tablet Take 1 tablet daily for one week, then increase to 1 tablet twice daily. 60 tablet 5  . traMADol (ULTRAM) 50 MG tablet Take 1 tablet (50 mg total) by mouth every 4 (four) hours as needed (cough). 180 tablet 0  . traZODone (DESYREL) 150 MG tablet Take 1 tablet (150 mg total) by mouth  at bedtime. 90 tablet 1   Current Facility-Administered Medications on File Prior to Visit  Medication Dose Route Frequency Provider Last Rate Last Dose  . goserelin (ZOLADEX) injection 3.6 mg  3.6 mg Subcutaneous Once Chauncey Cruel, MD        Allergies:  Allergies  Allergen Reactions  . Aspirin Shortness Of Breath and Palpitations    Pt can take ibuprofen without reaction  . Doxycycline Anaphylaxis  . Penicillins Shortness Of Breath and Palpitations    Has patient had a PCN reaction causing immediate rash, facial/tongue/throat swelling, SOB or lightheadedness with hypotension: Yes Has patient had a PCN reaction causing severe rash involving mucus membranes or skin necrosis: Yes Has patient had a PCN reaction that required hospitalization Yes Has patient had a PCN reaction occurring within the last 10 years: No If all of the above answers are "NO", then may proceed with Cephalosporin use.  Severiano Gilbert Extract Itching and Swelling    Lip swelling  . Strawberry Extract Hives     Review of Systems:  CONSTITUTIONAL: No fevers, chills, night sweats, or weight loss.   EYES: +visual changes or eye pain ENT: No hearing changes.  No history of nose bleeds.   RESPIRATORY: No cough, wheezing and shortness of breath.   CARDIOVASCULAR: Negative for chest pain, and palpitations.   GI: Negative for abdominal discomfort, blood in stools or black stools.  No recent change in bowel habits.   GU:  No history of incontinence.   MUSCLOSKELETAL: No history of joint pain or swelling.  No myalgias.   SKIN: Negative for lesions, rash, and itching.   ENDOCRINE: Negative for cold or heat intolerance, polydipsia or goiter.   PSYCH:  No depression or anxiety symptoms.   NEURO: As Above.   Vital Signs:  BP 190/90 mmHg  Pulse 127  Ht 5\' 7"  (1.702 m)  Wt 204 lb 1 oz (92.562 kg)  BMI 31.95 kg/m2  SpO2 90%  Neurological Exam: MENTAL STATUS including orientation to time, place, person is normal.     CRANIAL NERVES:  She is blind in the right eye - only able to see light.  Visual field testing of the left eye is shows reduced  peripheral vision on the left temporal field.  Pupils equal round and reactive to light.    MOTOR: Mild intention tremor bilaterally, especially when outstretched. No pronator drift.  Tone is normal.    Right Upper Extremity:    Left Upper Extremity:    Deltoid  5/5   Deltoid  5/5   Biceps  5/5   Biceps  5/5   Triceps  5/5   Triceps  5/5   Wrist extensors  5/5   Wrist extensors  5/5   Wrist flexors  5/5   Wrist flexors  5/5   Finger extensors  5/5   Finger extensors  4+/5   Finger flexors  5/5   Finger flexors  4+/5   Dorsal interossei  5/5   Dorsal interossei  4+/5   Abductor pollicis  5/5   Abductor pollicis  4+/5   Tone (Ashworth scale)  0  Tone (Ashworth scale)  0   Right Lower Extremity:    Left Lower Extremity:    Hip flexors  5/5   Hip flexors  5/5   Hip extensors  5/5   Hip extensors  5/5   Knee flexors  5/5   Knee flexors  5/5   Knee extensors  5/5   Knee extensors  5/5   Dorsiflexors  5/5   Dorsiflexors  4+/5   Plantarflexors  5/5   Plantarflexors  5-/5   Toe extensors  5/5   Toe extensors  5/5   Toe flexors  5/5   Toe flexors  4+/5   Tone (Ashworth scale)  0  Tone (Ashworth scale)  0   MSRs:  Right                                                                 Left brachioradialis 2+  brachioradialis 2+  biceps 2+  biceps 2+  triceps 2+  triceps 2+  patellar 2+  patellar 2+  ankle jerk 2+  ankle jerk 2+  Hoffman no  Hoffman no  plantar response down  plantar response down   SENSORY:  Intact to vibration and temperature throughout  COORDINATION/GAIT:   Gait narrow based, slow, slightly unsteady, unassisted.   Data: [facility], Dr. 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  MRI brain wwo contrast 05/18/2015: Stable MRI appearance of  the brain since 2016 with moderate for age nonspecific white matter signal changes. No acute intracranial abnormality.  MRV 06/30/2015:  No evidence of venous sinus thrombosis. No focal narrowing of the transverse-sigmoid sinus junction, as can be seen in the setting of idiopathic intracranial hypotension.  MRI cervical spine wo contrast 06/07/2015: 1. Small right paracentral/foraminal disc protrusion at T2-3, closely approximating the exiting right T2 nerve root. This could potentially produce right-sided radicular symptoms. 2. Mild right foraminal narrowing at C4-5 and C5-6 related to uncovertebral hypertrophy. No other significant right-sided foraminal narrowing within the cervical spine. 3. Small central disc protrusion at C6-7 without significant stenosis. 4. Additional tiny central disc protrusion at C2-3 without stenosis. 5. Additional minimal multilevel degenerative changes as above.  CT/A head and neck 07/26/2014:  Unremarkable MRI brain wwo contrast 08/24/2014: 1. No acute intracranial abnormality.  2. Increased number  of multiple subcortical T2 hyperintensities more prominent left than right. The finding is nonspecific but can be seen in the setting of chronic microvascular ischemia, a demyelinating process such as multiple sclerosis, vasculitis, complicated migraine headaches, or as the sequelae of a prior infectious or inflammatory process.  Lumbar puncture with OP 07/29/2014: Elevated opening pressure it 27 cm of water which was decreased to 16 cm of water.  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  She was evaluated at [facility] for VP shunt and she is undecided whether to pursue this or not.   Continue diamox to 1000mg  TID and topiramate 25mg  twice daily Stopped lasix due to persistent hypokalemia Check BMP She is also seeing Dr. 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  Clinically stable. Continue Lyrica to 200mg  TID and lidocaine ointment to her feet.    Benign essential tremors, monitor for now.   No betablocks given history of asthma  Syncopal spells, unclear etiology Cardiac work-up is negative, EEG is negative Patient informed of [place] law of no driving for 56-months since last spell of impaired consciousness  Resistant hypertension, asymptomatic Instructed to monitor BP at home and follow-up with PCP   Return to clinic in 3 months   The duration of this appointment visit was 25 minutes of face-to-face time with the patient.  Greater than 50% of this time was spent in counseling, explanation of diagnosis, planning of further management, and coordination of care.   Thank you for allowing me to participate in patient's care.  If I can answer any additional questions, I would be pleased to do so.    Sincerely,    Francille Wittmann K. Posey Pronto, DO

## 2015-07-23 ENCOUNTER — Other Ambulatory Visit: Payer: Self-pay | Admitting: Neurology

## 2015-07-23 ENCOUNTER — Other Ambulatory Visit: Payer: Self-pay | Admitting: Internal Medicine

## 2015-07-24 ENCOUNTER — Encounter: Payer: Self-pay | Admitting: Internal Medicine

## 2015-07-24 MED ORDER — BECLOMETHASONE DIPROPIONATE 80 MCG/ACT IN AERS: 2.0000 | INHALATION_SPRAY | Freq: Two times a day (BID) | RESPIRATORY_TRACT | Status: DC

## 2015-07-25 ENCOUNTER — Encounter: Payer: Self-pay | Admitting: *Deleted

## 2015-07-31 ENCOUNTER — Other Ambulatory Visit: Payer: Self-pay | Admitting: Nurse Practitioner

## 2015-07-31 ENCOUNTER — Encounter: Payer: Self-pay | Admitting: Nurse Practitioner

## 2015-07-31 ENCOUNTER — Telehealth: Payer: Self-pay | Admitting: Nurse Practitioner

## 2015-07-31 NOTE — Telephone Encounter (Signed)
left msg for pt to call back and resched cx appt

## 2015-08-03 ENCOUNTER — Ambulatory Visit (HOSPITAL_BASED_OUTPATIENT_CLINIC_OR_DEPARTMENT_OTHER): Payer: Medicare Other

## 2015-08-03 VITALS — BP 176/97 | Temp 98.6°F

## 2015-08-03 DIAGNOSIS — Z5111 Encounter for antineoplastic chemotherapy: Secondary | ICD-10-CM | POA: Diagnosis not present

## 2015-08-03 DIAGNOSIS — C50412 Malignant neoplasm of upper-outer quadrant of left female breast: Secondary | ICD-10-CM

## 2015-08-03 MED ORDER — GOSERELIN ACETATE 3.6 MG ~~LOC~~ IMPL
3.6000 mg | DRUG_IMPLANT | Freq: Once | SUBCUTANEOUS | Status: AC
  Administered 2015-08-03: 3.6 mg via SUBCUTANEOUS
  Filled 2015-08-03: qty 3.6

## 2015-08-04 ENCOUNTER — Emergency Department (HOSPITAL_COMMUNITY)
Admission: EM | Admit: 2015-08-04 | Discharge: 2015-08-04 | Disposition: A | Discharge status: Home / Self Care | Payer: Medicare Other | Attending: Emergency Medicine | Admitting: Emergency Medicine

## 2015-08-04 ENCOUNTER — Encounter (HOSPITAL_COMMUNITY): Payer: Self-pay

## 2015-08-04 ENCOUNTER — Emergency Department (HOSPITAL_COMMUNITY): Payer: Medicare Other

## 2015-08-04 DIAGNOSIS — S60410A Abrasion of right index finger, initial encounter: Secondary | ICD-10-CM | POA: Diagnosis not present

## 2015-08-04 DIAGNOSIS — Y9289 Other specified places as the place of occurrence of the external cause: Secondary | ICD-10-CM | POA: Insufficient documentation

## 2015-08-04 DIAGNOSIS — Y9389 Activity, other specified: Secondary | ICD-10-CM | POA: Diagnosis not present

## 2015-08-04 DIAGNOSIS — M79641 Pain in right hand: Secondary | ICD-10-CM | POA: Diagnosis not present

## 2015-08-04 DIAGNOSIS — Z79899 Other long term (current) drug therapy: Secondary | ICD-10-CM | POA: Insufficient documentation

## 2015-08-04 DIAGNOSIS — Z880 Allergy status to penicillin: Secondary | ICD-10-CM | POA: Diagnosis not present

## 2015-08-04 DIAGNOSIS — Z7951 Long term (current) use of inhaled steroids: Secondary | ICD-10-CM | POA: Insufficient documentation

## 2015-08-04 DIAGNOSIS — K219 Gastro-esophageal reflux disease without esophagitis: Secondary | ICD-10-CM | POA: Diagnosis not present

## 2015-08-04 DIAGNOSIS — J45909 Unspecified asthma, uncomplicated: Secondary | ICD-10-CM | POA: Insufficient documentation

## 2015-08-04 DIAGNOSIS — Z9981 Dependence on supplemental oxygen: Secondary | ICD-10-CM | POA: Insufficient documentation

## 2015-08-04 DIAGNOSIS — Z853 Personal history of malignant neoplasm of breast: Secondary | ICD-10-CM | POA: Insufficient documentation

## 2015-08-04 DIAGNOSIS — Z862 Personal history of diseases of the blood and blood-forming organs and certain disorders involving the immune mechanism: Secondary | ICD-10-CM | POA: Insufficient documentation

## 2015-08-04 DIAGNOSIS — S6991XA Unspecified injury of right wrist, hand and finger(s), initial encounter: Secondary | ICD-10-CM | POA: Diagnosis present

## 2015-08-04 DIAGNOSIS — Y998 Other external cause status: Secondary | ICD-10-CM | POA: Insufficient documentation

## 2015-08-04 DIAGNOSIS — G4733 Obstructive sleep apnea (adult) (pediatric): Secondary | ICD-10-CM | POA: Insufficient documentation

## 2015-08-04 DIAGNOSIS — S60221A Contusion of right hand, initial encounter: Secondary | ICD-10-CM | POA: Insufficient documentation

## 2015-08-04 DIAGNOSIS — I10 Essential (primary) hypertension: Secondary | ICD-10-CM | POA: Insufficient documentation

## 2015-08-04 DIAGNOSIS — W228XXA Striking against or struck by other objects, initial encounter: Secondary | ICD-10-CM | POA: Insufficient documentation

## 2015-08-04 NOTE — ED Provider Notes (Signed)
History  By signing my name below, I, Marlowe Kays, attest that this documentation has been prepared under the direction and in the presence of Sharlett Iles, MD. Electronically Signed: Marlowe Kays, ED Scribe. 08/04/2015. 3:57 AM.  Chief Complaint  Patient presents with  . Hand Pain   The history is provided by the patient and medical records. No language interpreter was used.    HPI Comments:  Erica [name] is a 40 y.o. female who presents to the Emergency Department complaining of dorsal right hand pain that began secondary to hitting her hand on a box. She reports associated swelling and an abrasion. She has not taken anything for pain. Moving her fingers of the right hand increases her pain. She denies alleviating factors. She denies right wrist or elbow pain, numbness, tingling or weakness of the right hand or fingers.   Past Medical History  Diagnosis Date  . Asthma   . Hypertension   . GERD (gastroesophageal reflux disease)   . Impaired glucose tolerance 04/13/2011  . Anemia, unspecified 04/13/2011  . Asthma 09/27/2010  . Bloating 03/18/2011  . 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 Lymphadenitis, chronic     restricted LEFT extremity  . Dyspnea on exertion     a. 01/2013 Lexi MV: EF 54%, no ischemia/infarct;  b. 05/2013 Echo: EF 60-65%, no rwma, Gr 2 DD;  b.   . OSA (obstructive sleep apnea) 05/04/2014    AHI 31/hr now on CPAP at 12cm H2O  . Breast cancer ([place]) 2010    a. locally advanced left breast carcinoma diagnosed in 2010 and treated with neoadjuvant chemotherapy with docetaxel and cyclophosphamide as well as paclitaxel. This was followed by radiation therapy which was completed in 2011.  . Fatty liver   . Gastroparesis    Past Surgical History  Procedure Laterality Date  . Tendon release Right 2012    "hand"  . Breast lumpectomy Left 09/2008  . Carpal tunnel release Left 2011  . Tubal ligation  05/11/2011    Procedure: POST PARTUM TUBAL LIGATION;   Surgeon: Emeterio Reeve, MD;  Location: [facility];  Service: Gynecology;  Laterality: Bilateral;  Induced for HTN  . Lymph node dissection Left 2010    breast; 2 wks after breast lumpectomy   Family History  Problem Relation Age of Onset  . Diabetes Maternal Grandmother   . Heart disease Maternal Grandmother   . Hypertension Maternal Grandmother   . Cancer Neg Hx   . Alcohol abuse Neg Hx   . Early death Neg Hx   . Hyperlipidemia Neg Hx   . Kidney disease Neg Hx   . Stroke Neg Hx   . Colon cancer Neg Hx    Social History  Substance Use Topics  . Smoking status: Never Smoker   . Smokeless tobacco: Never Used  . Alcohol Use: 0.0 oz/week    0 Standard drinks or equivalent per week     Comment: occasionally   OB History    Gravida Para Term Preterm AB TAB SAB Ectopic Multiple Living   5 4 4  1  1   4      Review of Systems A complete 10 system review of systems was obtained and all systems are negative except as noted in the HPI and PMH.   Allergies  Aspirin; Doxycycline; Penicillins; Kiwi extract; and Strawberry extract  Home Medications   Prior to Admission medications   Medication Sig Start Date End Date Taking? Authorizing Provider  acetaZOLAMIDE (  DIAMOX) 250 MG tablet Take 4 tablets (1,000 mg total) by mouth 3 (three) times daily. 05/18/15   Donika K Patel, DO  albuterol (PROVENTIL HFA;VENTOLIN HFA) 108 (90 BASE) MCG/ACT inhaler Inhale 2 puffs into the lungs every 4 (four) hours as needed for wheezing or shortness of breath (((PLAN A))). Patient taking differently: Inhale 2 puffs into the lungs every 4 (four) hours as needed for wheezing or shortness of breath (((PLAN B))).  08/30/14   Tammy S Parrett, NP  albuterol (PROVENTIL) (2.5 MG/3ML) 0.083% nebulizer solution Take 5ml every 4 hours and as needed for wheezing or shortness of breath. Dx: J45.99 07/19/15   Tammy S Parrett, NP  amLODipine (NORVASC) 10 MG tablet Take 10 mg by mouth every morning.    Historical Provider, MD   beclomethasone (QVAR) 80 MCG/ACT inhaler Inhale 2 puffs into the lungs 2 (two) times daily. 07/24/15   Tanda Rockers, MD  Biotin 10 MG TABS Take 1 tablet by mouth every morning.     Historical Provider, MD  BYSTOLIC 10 MG tablet Take 1 tablet (10 mg total) by mouth daily. Patient taking differently: Take 20 mg by mouth every morning.  05/09/15   [name], MD  chlorpheniramine (CHLOR-TRIMETON) 4 MG tablet Take 8 mg by mouth at bedtime.    Historical Provider, MD  cloNIDine (CATAPRES) 0.3 MG tablet Take 1 tablet by mouth 2 (two) times daily. 01/22/15   Historical Provider, MD  dexlansoprazole (DEXILANT) 60 MG capsule Take 60 mg by mouth daily before breakfast.     Historical Provider, MD  dextromethorphan-guaiFENesin (MUCINEX DM) 30-600 MG per 12 hr tablet Take 1 tablet by mouth 2 (two) times daily as needed (for cough, congestion, and thick mucus).     Historical Provider, MD  diltiazem (CARDIZEM LA) 420 MG 24 hr tablet Take 1 tablet (420 mg total) by mouth daily. 04/07/15   [name], MD  DULoxetine (CYMBALTA) 30 MG capsule Take 1 capsule (30 mg total) by mouth daily. 07/24/15   [name], MD  EPINEPHrine 0.3 mg/0.3 mL IJ SOAJ injection Inject 0.3 mLs (0.3 mg total) into the muscle once. Patient taking differently: Use as directed for allergic reaction 01/28/14   [name], MD  fluocinonide-emollient (LIDEX-E) 0.05 % cream Apply 1 application topically 2 (two) times daily. 05/09/15   [name], MD  hydrALAZINE (APRESOLINE) 100 MG tablet Take 1 tablet (100 mg total) by mouth 3 (three) times daily. 11/08/14   Biagio Borg, MD  hydrochlorothiazide (MICROZIDE) 12.5 MG capsule TAKE 1 CAPSULE(12.5 MG) BY MOUTH DAILY Patient taking differently: TAKE 1 CAPSULE(12.5 MG) BY MOUTH EVERY MORNING 05/10/15   [name], MD  isosorbide mononitrate (IMDUR) 60 MG 24 hr tablet Take 1 tablet (60 mg total) by mouth daily. Patient taking differently: Take 60 mg by mouth every morning.  02/08/14    [name], MD  LORazepam (ATIVAN) 0.5 MG tablet TAKE ONE TABLET BY MOUTH EVERY 8 HOURS AS NEEDED FOR ANXIETY 06/25/15   Chauncey Cruel, MD  metoCLOPramide (REGLAN) 10 MG tablet Take 10 mg by mouth 4 (four) times daily.    Historical Provider, MD  mometasone (NASONEX) 50 MCG/ACT nasal spray Place 2 sprays into the nose 2 (two) times daily. 07/18/15   Tammy S Parrett, NP  montelukast (SINGULAIR) 10 MG tablet TAKE 1 TABLET BY MOUTH EVERY NIGHT AT BEDTIME 04/07/15   Tanda Rockers, MD  nitroGLYCERIN (NITROSTAT) 0.4 MG SL tablet Place 1  tablet (0.4 mg total) under the tongue every 5 (five) minutes as needed for chest pain. 08/24/13   [name], MD  ondansetron (ZOFRAN-ODT) 4 MG disintegrating tablet Take 1 tablet (4 mg total) by mouth every 8 (eight) hours as needed for nausea or vomiting. 07/24/15   [name], MD  OXYGEN Use CPAP at bedtime    Historical Provider, MD  potassium chloride SA (K-DUR,KLOR-CON) 20 MEQ tablet Take 20 mEq by mouth 3 (three) times daily.    Historical Provider, MD  pregabalin (LYRICA) 200 MG capsule Take 1 capsule (200 mg total) by mouth 3 (three) times daily. 01/23/15   Donika Keith Rake, DO  primidone (MYSOLINE) 50 MG tablet Take 0.5 tablets (25 mg total) by mouth at bedtime. Patient taking differently: Take 25 mg by mouth every morning.  01/23/15   Alda Berthold, DO  Respiratory Therapy Supplies (FLUTTER) DEVI Use as directed 03/24/15   Tanda Rockers, MD  spironolactone (ALDACTONE) 25 MG tablet Take 1 tablet (25 mg total) by mouth daily. NEED OV. Patient taking differently: TAKE 1 TABLET BY MOUTH IN THE MORNING AND 2 TABLETS BY MOUTH IN THE EVENING 06/22/15   [name], MD  SYMBICORT 160-4.5 MCG/ACT inhaler INHALE 2 PUFFS BY MOUTH TWICE DAILY 01/23/15   Tammy S Parrett, NP  topiramate (TOPAMAX) 25 MG tablet Take 1 tablet daily for one week, then increase to 1 tablet twice daily. 05/18/15   Donika Keith Rake, DO  traMADol (ULTRAM) 50 MG tablet Take 1  tablet (50 mg total) by mouth every 4 (four) hours as needed (cough). 06/29/15   Tanda Rockers, MD  traZODone (DESYREL) 150 MG tablet Take 1 tablet (150 mg total) by mouth at bedtime. 06/15/15   [name], MD   Triage Vitals: BP 166/95 mmHg  Pulse 113  Temp(Src) 98.1 F (36.7 C) (Oral)  Resp 20  SpO2 96% Physical Exam  Constitutional: She is oriented to person, place, and time. She appears well-developed and well-nourished. No distress.  HENT:  Head: Normocephalic and atraumatic.  Moist mucous membranes  Eyes:  B/l conjunctival injection  Cardiovascular:  No murmur heard. Musculoskeletal: She exhibits edema and tenderness.  Mild swelling and TTP dorsal R 2nd-4th fingers, fingers held in partial flexion, no obvious deformity; normal ROM wrist and elbow, limited ROM fingers 2/2 pain; 2+ radial pulses  Neurological: She is alert and oriented to person, place, and time.  Fluent speech  Skin: Skin is warm and dry.  Small abrasion on dorsal R 2nd finger  Psychiatric: She has a normal mood and affect. Judgment normal.  Nursing note and vitals reviewed.   ED Course  Procedures (including critical care time) DIAGNOSTIC STUDIES: Oxygen Saturation is 96% on RA, adequate by my interpretation.   COORDINATION OF CARE: 3:55 AM- Informed pt of negative X-Ray. Advised pt to rest, ice and elevate right hand. Pt has an appt with PCP in three days so advised her to follow up with them for continued pain. Pt verbalizes understanding and agrees to plan.   Imaging Review Dg Hand Complete Right  08/04/2015  CLINICAL DATA:  Pain across the knuckles after injury this morning. EXAM: RIGHT HAND - COMPLETE 3+ VIEW COMPARISON:  02/05/2015 FINDINGS: There is no evidence of fracture or dislocation. There is no evidence of arthropathy or other focal bone abnormality. Soft tissue swelling over the dorsal aspect of the metacarpal heads. IMPRESSION: No acute bony abnormalities. Electronically Signed   By:  Gwyndolyn Saxon  Gerilyn Nestle M.D.   On: 08/04/2015 03:34   I have personally reviewed and evaluated these images as part of my medical decision-making.   MDM   Final diagnoses:  Hand contusion, right, initial encounter   Patient with right hand pain after striking the back of her hand on a box and night. She had mild swelling and tenderness of the dorsal surface of her right second through fourth fingers with no obvious deformity. Neurovascularly intact. Unremarkable plain films. Instructed on supportive care including ice, NSAIDs, and elevation. Patient will follow-up with PCP in a few days for reevaluation.   I personally performed the services described in this documentation, which was scribed in my presence. The recorded information has been reviewed and is accurate.    Sharlett Iles, MD 08/04/15 [phone]

## 2015-08-04 NOTE — Discharge Instructions (Signed)
Hand Contusion  A hand contusion is a deep bruise on your hand area. Contusions are the result of an injury that caused bleeding under the skin. The contusion may turn blue, purple, or yellow. Minor injuries will give you a painless contusion, but more severe contusions may stay painful and swollen for a few weeks.  CAUSES   A contusion is usually caused by a blow, trauma, or direct force to an area of the body.  SYMPTOMS    Swelling and redness of the injured area.   Discoloration of the injured area.   Tenderness and soreness of the injured area.   Pain.  DIAGNOSIS   The diagnosis can be made by taking a history and performing a physical exam. An X-ray, CT scan, or MRI may be needed to determine if there were any associated injuries, such as broken bones (fractures).  TREATMENT   Often, the best treatment for a hand contusion is resting, elevating, icing, and applying cold compresses to the injured area. Over-the-counter medicines may also be recommended for pain control.  HOME CARE INSTRUCTIONS    Put ice on the injured area.    Put ice in a plastic bag.    Place a towel between your skin and the bag.    Leave the ice on for 15-20 minutes, 03-04 times a day.   Only take over-the-counter or prescription medicines as directed by your caregiver. Your caregiver may recommend avoiding anti-inflammatory medicines (aspirin, ibuprofen, and naproxen) for 48 hours because these medicines may increase bruising.   If told, use an elastic wrap as directed. This can help reduce swelling. You may remove the wrap for sleeping, showering, and bathing. If your fingers become numb, cold, or blue, take the wrap off and reapply it more loosely.   Elevate your hand with pillows to reduce swelling.   Avoid overusing your hand if it is painful.  SEEK IMMEDIATE MEDICAL CARE IF:    You have increased redness, swelling, or pain in your hand.   Your swelling or pain is not relieved with medicines.   You have loss of feeling in  your hand or are unable to move your fingers.   Your hand turns cold or blue.   You have pain when you move your fingers.   Your hand becomes warm to the touch.   Your contusion does not improve in 2 days.  MAKE SURE YOU:    Understand these instructions.   Will watch your condition.   Will get help right away if you are not doing well or get worse.     This information is not intended to replace advice given to you by your health care provider. Make sure you discuss any questions you have with your health care provider.     Document Released: 09/07/2001 Document Revised: 12/11/2011 Document Reviewed: 09/09/2011  Elsevier Interactive Patient Education 2016 [employer]

## 2015-08-04 NOTE — ED Notes (Signed)
Pt hit her hand on a box, the top of her right hand is swollen at her knuckles

## 2015-08-07 ENCOUNTER — Telehealth: Payer: Self-pay | Admitting: *Deleted

## 2015-08-07 ENCOUNTER — Ambulatory Visit: Payer: Medicare Other | Admitting: Internal Medicine

## 2015-08-07 NOTE — Telephone Encounter (Signed)
Called [employer] D at [phone] and initiated PA for Qvar 64mcg. Medication approved until 08/06/2016. Auth # [id number].  Pharmacy informed.

## 2015-08-11 ENCOUNTER — Encounter: Payer: Self-pay | Admitting: Nurse Practitioner

## 2015-08-11 DIAGNOSIS — C50412 Malignant neoplasm of upper-outer quadrant of left female breast: Secondary | ICD-10-CM

## 2015-08-11 NOTE — Progress Notes (Signed)
A letter was mailed to Erica [name] at her home address on this date following her having canceled appointments in the [facility] clinic for ongoing surveillance and continued follow up. This letter stressed the importance of these visits and encouraged Erica [name] to call [phone] and reschedule her follow up appointment either with [place] in Survivorship or her medical oncologist's, Dr. Virgie Dad office, based on her preference. We will not be calling to make additional appointments, but will await her rescheduling. A copy of this letter can be found in her medical record under the letter tab.

## 2015-08-16 ENCOUNTER — Other Ambulatory Visit: Payer: Self-pay | Admitting: Cardiology

## 2015-08-16 NOTE — Telephone Encounter (Signed)
Rx request sent to pharmacy.  

## 2015-08-19 ENCOUNTER — Encounter (HOSPITAL_COMMUNITY): Payer: Self-pay | Admitting: *Deleted

## 2015-08-19 ENCOUNTER — Emergency Department (HOSPITAL_COMMUNITY)
Admission: EM | Admit: 2015-08-19 | Discharge: 2015-08-19 | Disposition: A | Discharge status: Home / Self Care | Payer: Medicare Other | Attending: Emergency Medicine | Admitting: Emergency Medicine

## 2015-08-19 DIAGNOSIS — Z79891 Long term (current) use of opiate analgesic: Secondary | ICD-10-CM | POA: Insufficient documentation

## 2015-08-19 DIAGNOSIS — R220 Localized swelling, mass and lump, head: Secondary | ICD-10-CM | POA: Diagnosis not present

## 2015-08-19 DIAGNOSIS — J45901 Unspecified asthma with (acute) exacerbation: Secondary | ICD-10-CM | POA: Insufficient documentation

## 2015-08-19 DIAGNOSIS — Z853 Personal history of malignant neoplasm of breast: Secondary | ICD-10-CM | POA: Diagnosis not present

## 2015-08-19 DIAGNOSIS — Z79899 Other long term (current) drug therapy: Secondary | ICD-10-CM | POA: Insufficient documentation

## 2015-08-19 DIAGNOSIS — Z7951 Long term (current) use of inhaled steroids: Secondary | ICD-10-CM | POA: Diagnosis not present

## 2015-08-19 DIAGNOSIS — R062 Wheezing: Secondary | ICD-10-CM | POA: Diagnosis present

## 2015-08-19 DIAGNOSIS — L298 Other pruritus: Secondary | ICD-10-CM | POA: Diagnosis not present

## 2015-08-19 DIAGNOSIS — I10 Essential (primary) hypertension: Secondary | ICD-10-CM | POA: Diagnosis not present

## 2015-08-19 MED ORDER — IPRATROPIUM-ALBUTEROL 0.5-2.5 (3) MG/3ML IN SOLN
3.0000 mL | Freq: Once | RESPIRATORY_TRACT | Status: AC
  Administered 2015-08-19: 3 mL via RESPIRATORY_TRACT
  Filled 2015-08-19: qty 3

## 2015-08-19 MED ORDER — ALBUTEROL SULFATE HFA 108 (90 BASE) MCG/ACT IN AERS
1.0000 | INHALATION_SPRAY | RESPIRATORY_TRACT | Status: DC | PRN
  Administered 2015-08-19: 2 via RESPIRATORY_TRACT
  Filled 2015-08-19: qty 6.7

## 2015-08-19 MED ORDER — DIPHENHYDRAMINE HCL 25 MG PO CAPS
25.0000 mg | ORAL_CAPSULE | Freq: Once | ORAL | Status: AC
  Administered 2015-08-19: 25 mg via ORAL
  Filled 2015-08-19: qty 1

## 2015-08-19 MED ORDER — RANITIDINE HCL 150 MG/10ML PO SYRP
150.0000 mg | ORAL_SOLUTION | Freq: Once | ORAL | Status: AC
  Administered 2015-08-19: 150 mg via ORAL
  Filled 2015-08-19: qty 10

## 2015-08-19 MED ORDER — ACETAMINOPHEN 325 MG PO TABS
650.0000 mg | ORAL_TABLET | Freq: Once | ORAL | Status: AC
  Administered 2015-08-19: 650 mg via ORAL
  Filled 2015-08-19: qty 2

## 2015-08-19 NOTE — ED Notes (Signed)
Pt complains of itching, wheezing, nausea, tongue swelling since yesterday. Pt states she cleaned out a storage unit yesterday, denies taking any new medications or change in diet/lotions/soaps. Pt has hx of environmental allergies.

## 2015-08-19 NOTE — ED Provider Notes (Signed)
CSN: [id number]     Arrival date & time 08/19/15  1232 History   First MD Initiated Contact with Patient 08/19/15 1239     Chief Complaint  Patient presents with  . Pruritis  . Wheezing  . Oral Swelling    HPI 40 year old female presents today with complaints of wheezing, oral swelling, itching. Patient notes a significant past medical history of asthma and allergies, notes that symptoms started approximately 10 hours ago. Pt Notes itching, wheezing, and subjective tongue swelling. Patient denies any difficulty breathing, swallowing, handling oral secretions. She denies any chest pain, abdominal pain, rash. Patient notes that she does have a past medical history of asthma and allergies, but denies any specific known exposures. Patient reports the only medication she has taken in the last 24 hours that is abnormal for her is Percocet for dental pain. Patient has no significant history of anaphylactic type reactions. Patient reports using Qvar at home. No oral medications prior to arrival   Past Medical History  Diagnosis Date  . Asthma   . Hypertension   . GERD (gastroesophageal reflux disease)   . Impaired glucose tolerance 04/13/2011  . Anemia, unspecified 04/13/2011  . Asthma 09/27/2010  . Bloating 03/18/2011  . 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 Lymphadenitis, chronic     restricted LEFT extremity  . Dyspnea on exertion     a. 01/2013 Lexi MV: EF 54%, no ischemia/infarct;  b. 05/2013 Echo: EF 60-65%, no rwma, Gr 2 DD;  b.   . OSA (obstructive sleep apnea) 05/04/2014    AHI 31/hr now on CPAP at 12cm H2O  . Breast cancer ([place]) 2010    a. locally advanced left breast carcinoma diagnosed in 2010 and treated with neoadjuvant chemotherapy with docetaxel and cyclophosphamide as well as paclitaxel. This was followed by radiation therapy which was completed in 2011.  . Fatty liver   . Gastroparesis    Past Surgical History  Procedure Laterality Date  . Tendon release Right 2012     "hand"  . Breast lumpectomy Left 09/2008  . Carpal tunnel release Left 2011  . Tubal ligation  05/11/2011    Procedure: POST PARTUM TUBAL LIGATION;  Surgeon: Emeterio Reeve, MD;  Location: [facility];  Service: Gynecology;  Laterality: Bilateral;  Induced for HTN  . Lymph node dissection Left 2010    breast; 2 wks after breast lumpectomy   Family History  Problem Relation Age of Onset  . Diabetes Maternal Grandmother   . Heart disease Maternal Grandmother   . Hypertension Maternal Grandmother   . Cancer Neg Hx   . Alcohol abuse Neg Hx   . Early death Neg Hx   . Hyperlipidemia Neg Hx   . Kidney disease Neg Hx   . Stroke Neg Hx   . Colon cancer Neg Hx    Social History  Substance Use Topics  . Smoking status: Never Smoker   . Smokeless tobacco: Never Used  . Alcohol Use: 0.0 oz/week    0 Standard drinks or equivalent per week     Comment: occasionally   OB History    Gravida Para Term Preterm AB TAB SAB Ectopic Multiple Living   5 4 4  1  1   4      Review of Systems  All other systems reviewed and are negative.   Allergies  Aspirin; Doxycycline; Penicillins; Kiwi extract; and Strawberry extract  Home Medications   Prior to Admission medications   Medication Sig Start Date End  Date Taking? Authorizing Provider  acetaZOLAMIDE (DIAMOX) 250 MG tablet Take 4 tablets (1,000 mg total) by mouth 3 (three) times daily. 05/18/15  Yes Donika K Patel, DO  albuterol (PROVENTIL HFA;VENTOLIN HFA) 108 (90 BASE) MCG/ACT inhaler Inhale 2 puffs into the lungs every 4 (four) hours as needed for wheezing or shortness of breath (((PLAN A))). Patient taking differently: Inhale 2 puffs into the lungs every 4 (four) hours as needed for wheezing or shortness of breath (((PLAN B))).  08/30/14  Yes Tammy S Parrett, NP  albuterol (PROVENTIL) (2.5 MG/3ML) 0.083% nebulizer solution Take 27ml every 4 hours and as needed for wheezing or shortness of breath. Dx: J45.99 07/19/15  Yes Tammy S Parrett, NP  amLODipine  (NORVASC) 10 MG tablet Take 10 mg by mouth every morning.   Yes Historical Provider, MD  beclomethasone (QVAR) 80 MCG/ACT inhaler Inhale 2 puffs into the lungs 2 (two) times daily. 07/24/15  Yes Tanda Rockers, MD  Biotin 10 MG TABS Take 1 tablet by mouth every morning.    Yes Historical Provider, MD  BYSTOLIC 20 MG TABS Take 20 mg by mouth daily. 07/23/15  Yes Historical Provider, MD  chlorpheniramine (CHLOR-TRIMETON) 4 MG tablet Take 8 mg by mouth at bedtime.   Yes Historical Provider, MD  cloNIDine (CATAPRES) 0.3 MG tablet TAKE 1 TABLET BY MOUTH TWICE DAILY 08/16/15  Yes [name], MD  dexlansoprazole (DEXILANT) 60 MG capsule Take 60 mg by mouth daily before breakfast.    Yes Historical Provider, MD  dextromethorphan-guaiFENesin (MUCINEX DM) 30-600 MG per 12 hr tablet Take 1 tablet by mouth 2 (two) times daily as needed (for cough, congestion, and thick mucus).    Yes Historical Provider, MD  diltiazem (CARDIZEM LA) 420 MG 24 hr tablet Take 1 tablet (420 mg total) by mouth daily. 04/07/15  Yes [name], MD  DULoxetine (CYMBALTA) 30 MG capsule Take 1 capsule (30 mg total) by mouth daily. 07/24/15  Yes [name], MD  EPINEPHrine 0.3 mg/0.3 mL IJ SOAJ injection Inject 0.3 mLs (0.3 mg total) into the muscle once. Patient taking differently: Use as directed for allergic reaction 01/28/14  Yes [name], MD  fluocinonide-emollient (LIDEX-E) 0.05 % cream Apply 1 application topically 2 (two) times daily. 05/09/15  Yes [name], MD  hydrALAZINE (APRESOLINE) 100 MG tablet Take 1 tablet (100 mg total) by mouth 3 (three) times daily. 11/08/14  Yes Biagio Borg, MD  hydrochlorothiazide (MICROZIDE) 12.5 MG capsule TAKE 1 CAPSULE(12.5 MG) BY MOUTH DAILY Patient taking differently: TAKE 1 CAPSULE(12.5 MG) BY MOUTH EVERY MORNING 05/10/15  Yes [name], MD  isosorbide mononitrate (IMDUR) 60 MG 24 hr tablet Take 1 tablet (60 mg total) by mouth daily. Patient taking differently: Take 60  mg by mouth every morning.  02/08/14  Yes [name], MD  LORazepam (ATIVAN) 0.5 MG tablet TAKE ONE TABLET BY MOUTH EVERY 8 HOURS AS NEEDED FOR ANXIETY 06/25/15  Yes Chauncey Cruel, MD  metoCLOPramide (REGLAN) 10 MG tablet Take 10 mg by mouth 4 (four) times daily.   Yes Historical Provider, MD  mometasone (NASONEX) 50 MCG/ACT nasal spray Place 2 sprays into the nose 2 (two) times daily. 07/18/15  Yes Tammy S Parrett, NP  montelukast (SINGULAIR) 10 MG tablet TAKE 1 TABLET BY MOUTH EVERY NIGHT AT BEDTIME 04/07/15  Yes Tanda Rockers, MD  nitroGLYCERIN (NITROSTAT) 0.4 MG SL tablet Place 1 tablet (0.4 mg total) under the tongue every 5 (five)  minutes as needed for chest pain. 08/24/13  Yes [name], MD  ondansetron (ZOFRAN-ODT) 4 MG disintegrating tablet Take 1 tablet (4 mg total) by mouth every 8 (eight) hours as needed for nausea or vomiting. 07/24/15  Yes [name], MD  OXYGEN Use CPAP at bedtime   Yes Historical Provider, MD  potassium chloride SA (K-DUR,KLOR-CON) 20 MEQ tablet Take 20 mEq by mouth 3 (three) times daily.   Yes Historical Provider, MD  pregabalin (LYRICA) 200 MG capsule Take 1 capsule (200 mg total) by mouth 3 (three) times daily. 01/23/15  Yes Donika K Patel, DO  primidone (MYSOLINE) 50 MG tablet Take 0.5 tablets (25 mg total) by mouth at bedtime. Patient taking differently: Take 25 mg by mouth every morning.  01/23/15  Yes Donika Keith Rake, DO  spironolactone (ALDACTONE) 25 MG tablet Take 1 tablet (25 mg total) by mouth daily. NEED OV. Patient taking differently: TAKE 1 TABLET BY MOUTH IN THE MORNING AND 2 TABLETS BY MOUTH IN THE EVENING 06/22/15  Yes [name], MD  SYMBICORT 160-4.5 MCG/ACT inhaler INHALE 2 PUFFS BY MOUTH TWICE DAILY 01/23/15  Yes Tammy S Parrett, NP  topiramate (TOPAMAX) 25 MG tablet Take 1 tablet daily for one week, then increase to 1 tablet twice daily. Patient taking differently: Take 25 mg by mouth 2 (two) times daily.  05/18/15  Yes  Donika K Patel, DO  traMADol (ULTRAM) 50 MG tablet Take 1 tablet (50 mg total) by mouth every 4 (four) hours as needed (cough). 06/29/15  Yes Tanda Rockers, MD  traZODone (DESYREL) 150 MG tablet Take 1 tablet (150 mg total) by mouth at bedtime. 06/15/15  Yes [name], MD  BYSTOLIC 10 MG tablet Take 1 tablet (10 mg total) by mouth daily. Patient not taking: Reported on 08/19/2015 05/09/15   [name], MD  Respiratory Therapy Supplies (FLUTTER) DEVI Use as directed 03/24/15   Tanda Rockers, MD   BP 190/100 mmHg  Pulse 96  Temp(Src) 98.7 F (37.1 C) (Oral)  Resp 20  SpO2 98%   Physical Exam  Constitutional: She is oriented to person, place, and time. She appears well-developed and well-nourished.  HENT:  Head: Normocephalic and atraumatic.  Eyes: Conjunctivae are normal. Pupils are equal, round, and reactive to light. Right eye exhibits no discharge. Left eye exhibits no discharge. No scleral icterus.  Neck: Normal range of motion. No JVD present. No tracheal deviation present.  Pulmonary/Chest: Effort normal. No stridor. No respiratory distress. She has wheezes. She has no rales. She exhibits no tenderness.  Musculoskeletal: Normal range of motion. She exhibits no edema or tenderness.  Neurological: She is alert and oriented to person, place, and time. Coordination normal.  Skin: Skin is warm and dry. No rash noted. No erythema. No pallor.  Psychiatric: She has a normal mood and affect. Her behavior is normal. Judgment and thought content normal.  Nursing note and vitals reviewed.   ED Course  Procedures (including critical care time) Labs Review Labs Reviewed - No data to display  Imaging Review No results found. I have personally reviewed and evaluated these images and lab results as part of my medical decision-making.   EKG Interpretation None      MDM   Final diagnoses:  Asthma exacerbation  Essential hypertension    Labs:    Imaging:  Consults:  Therapeutics: duoneb, diphenhydramine, zantac   Discharge Meds:   Assessment/Plan:40 year old female presents today with likely asthma exacerbation. Patient had  obvious wheezing on exam, she was in no respiratory distress. Patient reports sensation of a swollen tongue, none noted on exam. Patient was given antihistamines, albuterol here in the ED. Patient lungs are clear, she feels dramatically improved, no sensation of tongue swelling. Patient has a history of anaphylactic reactions, no new exposures to account for this. Patient reports a there are minor global headache with no neurological deficits after breathing treatment. No red flags. Likely environmental. She'll be discharged home with instructions to continue using Benadryl, albuterol, follow-up with primary care for reevaluation of her hypertension.        Okey Regal, PA-C 08/19/15 1416  Harvel Quale, MD 08/22/15 [phone]

## 2015-08-19 NOTE — Discharge Instructions (Signed)
Please follow-up with your primary care provider for reevaluation. Please recheck your blood pressure, and take blood pressure meds as directed. Please return to the emergency room immediately if any new or worsening signs or symptoms present. Asthma, Acute Bronchospasm Acute bronchospasm caused by asthma is also referred to as an asthma attack. Bronchospasm means your air passages become narrowed. The narrowing is caused by inflammation and tightening of the muscles in the air tubes (bronchi) in your lungs. This can make it hard to breathe or cause you to wheeze and cough. CAUSES Possible triggers are:  Animal dander from the skin, hair, or feathers of animals.  Dust mites contained in house dust.  Cockroaches.  Pollen from trees or grass.  Mold.  Cigarette or tobacco smoke.  Air pollutants such as dust, household cleaners, hair sprays, aerosol sprays, paint fumes, strong chemicals, or strong odors.  Cold air or weather changes. Cold air may trigger inflammation. Winds increase molds and pollens in the air.  Strong emotions such as crying or laughing hard.  Stress.  Certain medicines such as aspirin or beta-blockers.  Sulfites in foods and drinks, such as dried fruits and wine.  Infections or inflammatory conditions, such as a flu, cold, or inflammation of the nasal membranes (rhinitis).  Gastroesophageal reflux disease (GERD). GERD is a condition where stomach acid backs up into your esophagus.  Exercise or strenuous activity. SIGNS AND SYMPTOMS   Wheezing.  Excessive coughing, particularly at night.  Chest tightness.  Shortness of breath. DIAGNOSIS  Your health care provider will ask you about your medical history and perform a physical exam. A chest X-ray or blood testing may be performed to look for other causes of your symptoms or other conditions that may have triggered your asthma attack. TREATMENT  Treatment is aimed at reducing inflammation and opening up the  airways in your lungs. Most asthma attacks are treated with inhaled medicines. These include quick relief or rescue medicines (such as bronchodilators) and controller medicines (such as inhaled corticosteroids). These medicines are sometimes given through an inhaler or a nebulizer. Systemic steroid medicine taken by mouth or given through an IV tube also can be used to reduce the inflammation when an attack is moderate or severe. Antibiotic medicines are only used if a bacterial infection is present.  HOME CARE INSTRUCTIONS   Rest.  Drink plenty of liquids. This helps the mucus to remain thin and be easily coughed up. Only use caffeine in moderation and do not use alcohol until you have recovered from your illness.  Do not smoke. Avoid being exposed to secondhand smoke.  You play a critical role in keeping yourself in good health. Avoid exposure to things that cause you to wheeze or to have breathing problems.  Keep your medicines up-to-date and available. Carefully follow your health care provider's treatment plan.  Take your medicine exactly as prescribed.  When pollen or pollution is bad, keep windows closed and use an air conditioner or go to places with air conditioning.  Asthma requires careful medical care. See your health care provider for a follow-up as advised. If you are more than [redacted] weeks pregnant and you were prescribed any new medicines, let your obstetrician know about the visit and how you are doing. Follow up with your health care provider as directed.  After you have recovered from your asthma attack, make an appointment with your outpatient doctor to talk about ways to reduce the likelihood of future attacks. If you do not have a doctor  who manages your asthma, make an appointment with a primary care doctor to discuss your asthma. SEEK IMMEDIATE MEDICAL CARE IF:   You are getting worse.  You have trouble breathing. If severe, call your local emergency services (911 in the  [place]  You develop chest pain or discomfort.  You are vomiting.  You are not able to keep fluids down.  You are coughing up yellow, green, brown, or bloody sputum.  You have a fever and your symptoms suddenly get worse.  You have trouble swallowing. MAKE SURE YOU:   Understand these instructions.  Will watch your condition.  Will get help right away if you are not doing well or get worse.   This information is not intended to replace advice given to you by your health care provider. Make sure you discuss any questions you have with your health care provider.   Document Released: 07/03/2006 Document Revised: 03/23/2013 Document Reviewed: 09/23/2012 Elsevier Interactive Patient Education [employer].

## 2015-08-22 ENCOUNTER — Encounter: Payer: Self-pay | Admitting: Internal Medicine

## 2015-08-22 ENCOUNTER — Ambulatory Visit (INDEPENDENT_AMBULATORY_CARE_PROVIDER_SITE_OTHER): Payer: Medicare Other | Admitting: Internal Medicine

## 2015-08-22 ENCOUNTER — Other Ambulatory Visit (INDEPENDENT_AMBULATORY_CARE_PROVIDER_SITE_OTHER): Payer: Medicare Other

## 2015-08-22 VITALS — BP 164/86 | HR 98 | Temp 98.7°F | Resp 16 | Ht 67.0 in | Wt 202.0 lb

## 2015-08-22 DIAGNOSIS — R739 Hyperglycemia, unspecified: Secondary | ICD-10-CM | POA: Diagnosis not present

## 2015-08-22 DIAGNOSIS — E876 Hypokalemia: Secondary | ICD-10-CM | POA: Diagnosis not present

## 2015-08-22 DIAGNOSIS — I10 Essential (primary) hypertension: Secondary | ICD-10-CM

## 2015-08-22 LAB — BASIC METABOLIC PANEL
BUN: 11 mg/dL (ref 6–23)
CALCIUM: 9.7 mg/dL (ref 8.4–10.5)
CO2: 32 mEq/L (ref 19–32)
Chloride: 105 mEq/L (ref 96–112)
Creatinine, Ser: 1.12 mg/dL (ref 0.40–1.20)
GFR: 68.64 mL/min (ref >60.00)
Glucose, Bld: 98 mg/dL (ref 70–99)
POTASSIUM: 4 meq/L (ref 3.5–5.1)
SODIUM: 145 meq/L (ref 135–145)

## 2015-08-22 LAB — MAGNESIUM: MAGNESIUM: 2 mg/dL (ref 1.5–2.5)

## 2015-08-22 LAB — HEMOGLOBIN A1C: Hgb A1c MFr Bld: 5.6 % (ref 4.6–6.5)

## 2015-08-22 MED ORDER — TRIAMTERENE-HCTZ 37.5-25 MG PO TABS: 1.0000 | ORAL_TABLET | Freq: Every day | ORAL | Status: DC

## 2015-08-22 NOTE — Progress Notes (Signed)
Pre visit review using our clinic review tool, if applicable. No additional management support is needed unless otherwise documented below in the visit note. 

## 2015-08-22 NOTE — Patient Instructions (Signed)
Hypertension Hypertension, commonly called high blood pressure, is when the force of blood pumping through your arteries is too strong. Your arteries are the blood vessels that carry blood from your heart throughout your body. A blood pressure reading consists of a higher number over a lower number, such as 110/72. The higher number (systolic) is the pressure inside your arteries when your heart pumps. The lower number (diastolic) is the pressure inside your arteries when your heart relaxes. Ideally you want your blood pressure below 120/80. Hypertension forces your heart to work harder to pump blood. Your arteries may become narrow or stiff. Having untreated or uncontrolled hypertension can cause heart attack, stroke, kidney disease, and other problems. RISK FACTORS Some risk factors for high blood pressure are controllable. Others are not.  Risk factors you cannot control include:   Race. You may be at higher risk if you are African American.  Age. Risk increases with age.  Gender. Men are at higher risk than women before age 45 years. After age 65, women are at higher risk than men. Risk factors you can control include:  Not getting enough exercise or physical activity.  Being overweight.  Getting too much fat, sugar, calories, or salt in your diet.  Drinking too much alcohol. SIGNS AND SYMPTOMS Hypertension does not usually cause signs or symptoms. Extremely high blood pressure (hypertensive crisis) may cause headache, anxiety, shortness of breath, and nosebleed. DIAGNOSIS To check if you have hypertension, your health care provider will measure your blood pressure while you are seated, with your arm held at the level of your heart. It should be measured at least twice using the same arm. Certain conditions can cause a difference in blood pressure between your right and left arms. A blood pressure reading that is higher than normal on one occasion does not mean that you need treatment. If  it is not clear whether you have high blood pressure, you may be asked to return on a different day to have your blood pressure checked again. Or, you may be asked to monitor your blood pressure at home for 1 or more weeks. TREATMENT Treating high blood pressure includes making lifestyle changes and possibly taking medicine. Living a healthy lifestyle can help lower high blood pressure. You may need to change some of your habits. Lifestyle changes may include:  Following the DASH diet. This diet is high in fruits, vegetables, and whole grains. It is low in salt, red meat, and added sugars.  Keep your sodium intake below 2,300 mg per day.  Getting at least 30-45 minutes of aerobic exercise at least 4 times per week.  Losing weight if necessary.  Not smoking.  Limiting alcoholic beverages.  Learning ways to reduce stress. Your health care provider may prescribe medicine if lifestyle changes are not enough to get your blood pressure under control, and if one of the following is true:  You are 18-59 years of age and your systolic blood pressure is above 140.  You are 60 years of age or older, and your systolic blood pressure is above 150.  Your diastolic blood pressure is above 90.  You have diabetes, and your systolic blood pressure is over 140 or your diastolic blood pressure is over 90.  You have kidney disease and your blood pressure is above 140/90.  You have heart disease and your blood pressure is above 140/90. Your personal target blood pressure may vary depending on your medical conditions, your age, and other factors. HOME CARE INSTRUCTIONS    Have your blood pressure rechecked as directed by your health care provider.   Take medicines only as directed by your health care provider. Follow the directions carefully. Blood pressure medicines must be taken as prescribed. The medicine does not work as well when you skip doses. Skipping doses also puts you at risk for  problems.  Do not smoke.   Monitor your blood pressure at home as directed by your health care provider. SEEK MEDICAL CARE IF:   You think you are having a reaction to medicines taken.  You have recurrent headaches or feel dizzy.  You have swelling in your ankles.  You have trouble with your vision. SEEK IMMEDIATE MEDICAL CARE IF:  You develop a severe headache or confusion.  You have unusual weakness, numbness, or feel faint.  You have severe chest or abdominal pain.  You vomit repeatedly.  You have trouble breathing. MAKE SURE YOU:   Understand these instructions.  Will watch your condition.  Will get help right away if you are not doing well or get worse.   This information is not intended to replace advice given to you by your health care provider. Make sure you discuss any questions you have with your health care provider.   Document Released: 03/18/2005 Document Revised: 08/02/2014 Document Reviewed: 01/08/2013 Elsevier Interactive Patient Education 2016 [employer]  

## 2015-08-22 NOTE — Progress Notes (Signed)
Subjective:  Patient ID: Erica [name], female    DOB: 28-Jul-1975  Age: 40 y.o. MRN: [id number]  CC: Hypertension   HPI Erica [name] presents for a blood pressure check, she tells me her BP has not been well controlled. She was recently seen in emergency room for flareup of asthma and her blood pressure was high. Her asthma symptoms have markedly improved. She has a very complicated list of antihypertensives and when I ask her which ones she is taking she responds in a way that lets me know that she is not very invested in her health care or which medications she is taking. She has 2 doses of nebivolol listed, she is not sure which one she is taking. She has 2 different calcium channel blockers listed, she doesn't know if she is taking both of them. She has 3 different diuretics listed, she thinks she is taking all 3 of them but she is not sure. She denies any recent episodes of headache, blurred vision, chest pain, shortness of breath, edema, muscle aches, palpitations, or dyspnea on exertion.  Outpatient Prescriptions Prior to Visit  Medication Sig Dispense Refill  . acetaZOLAMIDE (DIAMOX) 250 MG tablet Take 4 tablets (1,000 mg total) by mouth 3 (three) times daily. 400 tablet 11  . albuterol (PROVENTIL HFA;VENTOLIN HFA) 108 (90 BASE) MCG/ACT inhaler Inhale 2 puffs into the lungs every 4 (four) hours as needed for wheezing or shortness of breath (((PLAN A))). (Patient taking differently: Inhale 2 puffs into the lungs every 4 (four) hours as needed for wheezing or shortness of breath (((PLAN B))). ) 18 g 5  . albuterol (PROVENTIL) (2.5 MG/3ML) 0.083% nebulizer solution Take 44ml every 4 hours and as needed for wheezing or shortness of breath. Dx: J45.99 300 mL 1  . beclomethasone (QVAR) 80 MCG/ACT inhaler Inhale 2 puffs into the lungs 2 (two) times daily. 1 Inhaler 5  . Biotin 10 MG TABS Take 1 tablet by mouth every morning.     Marland Kitchen BYSTOLIC 20 MG TABS Take 20 mg by mouth daily.  3  .  chlorpheniramine (CHLOR-TRIMETON) 4 MG tablet Take 8 mg by mouth at bedtime.    . cloNIDine (CATAPRES) 0.3 MG tablet TAKE 1 TABLET BY MOUTH TWICE DAILY 180 tablet 0  . dexlansoprazole (DEXILANT) 60 MG capsule Take 60 mg by mouth daily before breakfast.     . dextromethorphan-guaiFENesin (MUCINEX DM) 30-600 MG per 12 hr tablet Take 1 tablet by mouth 2 (two) times daily as needed (for cough, congestion, and thick mucus).     Marland Kitchen diltiazem (CARDIZEM LA) 420 MG 24 hr tablet Take 1 tablet (420 mg total) by mouth daily. 90 tablet 3  . DULoxetine (CYMBALTA) 30 MG capsule Take 1 capsule (30 mg total) by mouth daily. 90 capsule 1  . EPINEPHrine 0.3 mg/0.3 mL IJ SOAJ injection Inject 0.3 mLs (0.3 mg total) into the muscle once. (Patient taking differently: Use as directed for allergic reaction) 2 Device 1  . fluocinonide-emollient (LIDEX-E) 0.05 % cream Apply 1 application topically 2 (two) times daily. 60 g 1  . hydrALAZINE (APRESOLINE) 100 MG tablet Take 1 tablet (100 mg total) by mouth 3 (three) times daily. 90 tablet 11  . isosorbide mononitrate (IMDUR) 60 MG 24 hr tablet Take 1 tablet (60 mg total) by mouth daily. (Patient taking differently: Take 60 mg by mouth every morning. ) 30 tablet 11  . LORazepam (ATIVAN) 0.5 MG tablet TAKE ONE TABLET BY MOUTH EVERY 8 HOURS AS  NEEDED FOR ANXIETY 90 tablet 0  . metoCLOPramide (REGLAN) 10 MG tablet Take 10 mg by mouth 4 (four) times daily.    . mometasone (NASONEX) 50 MCG/ACT nasal spray Place 2 sprays into the nose 2 (two) times daily. 17 g 5  . montelukast (SINGULAIR) 10 MG tablet TAKE 1 TABLET BY MOUTH EVERY NIGHT AT BEDTIME 90 tablet 1  . nitroGLYCERIN (NITROSTAT) 0.4 MG SL tablet Place 1 tablet (0.4 mg total) under the tongue every 5 (five) minutes as needed for chest pain. 90 tablet 3  . ondansetron (ZOFRAN-ODT) 4 MG disintegrating tablet Take 1 tablet (4 mg total) by mouth every 8 (eight) hours as needed for nausea or vomiting. 20 tablet 0  . OXYGEN Use CPAP at  bedtime    . potassium chloride SA (K-DUR,KLOR-CON) 20 MEQ tablet Take 20 mEq by mouth 3 (three) times daily.    . pregabalin (LYRICA) 200 MG capsule Take 1 capsule (200 mg total) by mouth 3 (three) times daily. 90 capsule 5  . primidone (MYSOLINE) 50 MG tablet Take 0.5 tablets (25 mg total) by mouth at bedtime. (Patient taking differently: Take 25 mg by mouth every morning. ) 30 tablet 5  . Respiratory Therapy Supplies (FLUTTER) DEVI Use as directed 1 each 0  . SYMBICORT 160-4.5 MCG/ACT inhaler INHALE 2 PUFFS BY MOUTH TWICE DAILY 10.2 g 5  . topiramate (TOPAMAX) 25 MG tablet Take 1 tablet daily for one week, then increase to 1 tablet twice daily. (Patient taking differently: Take 25 mg by mouth 2 (two) times daily. ) 60 tablet 5  . traMADol (ULTRAM) 50 MG tablet Take 1 tablet (50 mg total) by mouth every 4 (four) hours as needed (cough). 180 tablet 0  . traZODone (DESYREL) 150 MG tablet Take 1 tablet (150 mg total) by mouth at bedtime. 90 tablet 1  . amLODipine (NORVASC) 10 MG tablet Take 10 mg by mouth every morning.    Marland Kitchen BYSTOLIC 10 MG tablet Take 1 tablet (10 mg total) by mouth daily. (Patient not taking: Reported on 08/19/2015) 90 tablet 3  . hydrochlorothiazide (MICROZIDE) 12.5 MG capsule TAKE 1 CAPSULE(12.5 MG) BY MOUTH DAILY (Patient taking differently: TAKE 1 CAPSULE(12.5 MG) BY MOUTH EVERY MORNING) 90 capsule 1  . spironolactone (ALDACTONE) 25 MG tablet Take 1 tablet (25 mg total) by mouth daily. NEED OV. (Patient taking differently: TAKE 1 TABLET BY MOUTH IN THE MORNING AND 2 TABLETS BY MOUTH IN THE EVENING) 90 tablet 0   Facility-Administered Medications Prior to Visit  Medication Dose Route Frequency Provider Last Rate Last Dose  . goserelin (ZOLADEX) injection 3.6 mg  3.6 mg Subcutaneous Once Chauncey Cruel, MD        ROS Review of Systems  Constitutional: Negative.  Negative for fever, chills, diaphoresis, appetite change and fatigue.  HENT: Negative.   Eyes: Negative.   Negative for visual disturbance.  Respiratory: Negative.  Negative for cough, choking, chest tightness, shortness of breath and stridor.   Cardiovascular: Negative.  Negative for chest pain, palpitations and leg swelling.  Gastrointestinal: Negative.  Negative for nausea, vomiting, abdominal pain, diarrhea and constipation.  Endocrine: Negative.   Genitourinary: Negative.  Negative for dysuria, urgency, hematuria and difficulty urinating.  Musculoskeletal: Negative.  Negative for myalgias, back pain, joint swelling, arthralgias and neck pain.  Skin: Negative.  Negative for color change and rash.  Allergic/Immunologic: Negative.   Neurological: Negative.  Negative for dizziness, tremors, weakness, light-headedness, numbness and headaches.  Hematological: Negative.  Negative for adenopathy.  Does not bruise/bleed easily.  Psychiatric/Behavioral: Negative.     Objective:  BP 164/86 mmHg  Pulse 98  Temp(Src) 98.7 F (37.1 C) (Oral)  Resp 16  Ht 5\' 7"  (1.702 m)  Wt 202 lb (91.627 kg)  BMI 31.63 kg/m2  SpO2 97%  BP Readings from Last 3 Encounters:  08/22/15 164/86  08/19/15 183/102  08/04/15 166/95    Wt Readings from Last 3 Encounters:  08/22/15 202 lb (91.627 kg)  07/21/15 204 lb 1 oz (92.562 kg)  07/18/15 202 lb (91.627 kg)    Physical Exam  Constitutional: She is oriented to person, place, and time. No distress.  HENT:  Mouth/Throat: Oropharynx is clear and moist. No oropharyngeal exudate.  Eyes: Conjunctivae are normal. Right eye exhibits no discharge. Left eye exhibits no discharge. No scleral icterus.  Neck: Normal range of motion. Neck supple. No JVD present. No tracheal deviation present. No thyromegaly present.  Cardiovascular: Normal rate, regular rhythm, normal heart sounds and intact distal pulses.  Exam reveals no gallop and no friction rub.   No murmur heard. Pulmonary/Chest: Effort normal and breath sounds normal. No respiratory distress. She has no wheezes. She  has no rales. She exhibits no tenderness.  Abdominal: Soft. Bowel sounds are normal. She exhibits no distension and no mass. There is no tenderness. There is no rebound and no guarding.  Musculoskeletal: Normal range of motion. She exhibits no edema or tenderness.  Lymphadenopathy:    She has no cervical adenopathy.  Neurological: She is oriented to person, place, and time.  Skin: Skin is warm and dry. No rash noted. She is not diaphoretic. No erythema. No pallor.  Vitals reviewed.   Lab Results  Component Value Date   WBC 7.9 06/09/2015   HGB 12.7 06/09/2015   HCT 38.0 06/09/2015   PLT 244 06/09/2015   GLUCOSE 98 08/22/2015   CHOL 206* 06/15/2015   TRIG 110.0 06/15/2015   HDL 44.10 06/15/2015   LDLCALC 140* 06/15/2015   ALT 27 09/20/2014   AST 28 09/20/2014   NA 145 08/22/2015   K 4.0 08/22/2015   CL 105 08/22/2015   CREATININE 1.12 08/22/2015   BUN 11 08/22/2015   CO2 32 08/22/2015   TSH 1.06 06/15/2015   INR 1.1* 06/03/2012   HGBA1C 5.6 08/22/2015    No results found.  Assessment & Plan:   Sylvanna was seen today for hypertension.  Diagnoses and all orders for this visit:  Essential hypertension- her blood pressure is not well controlled, fortunately she does not have any signs of end organ damage and her blood work doesn't show any secondary causes of HTN, she does have a history of sleep apnea but she tells me that it is being adequately treated with CPAP, I have gone over her medication list with her extensively and have consolidated to one dose of bystolic, one CCB, and will consolidate her diuretic into one combination of hydrochlorothiazide plus triamterene. I'm starting triamterene because she has a history of rather severe hypokalemia. -     Basic metabolic panel; Future -     triamterene-hydrochlorothiazide (MAXZIDE-25) 37.5-25 MG tablet; Take 1 tablet by mouth daily.  Hyperglycemia- she has mild prediabetes, no medications are needed at this time, she agrees  to work on her lifestyle modifications. -     Basic metabolic panel; Future -     Hemoglobin A1c; Future  Hypokalemia- improvement noted, since her blood pressure is not well controlled I will change her to a potassium sparing  diuretic to avoid future episodes of hypokalemia -     Basic metabolic panel; Future -     Magnesium; Future -     triamterene-hydrochlorothiazide (MAXZIDE-25) 37.5-25 MG tablet; Take 1 tablet by mouth daily.   I have discontinued Ms. Lento's hydrochlorothiazide, spironolactone, and amLODipine. I am also having her start on triamterene-hydrochlorothiazide. Additionally, I am having her maintain her nitroGLYCERIN, EPINEPHrine, isosorbide mononitrate, Biotin, albuterol, dextromethorphan-guaiFENesin, potassium chloride SA, chlorpheniramine, hydrALAZINE, dexlansoprazole, SYMBICORT, primidone, pregabalin, FLUTTER, montelukast, diltiazem, fluocinonide-emollient, acetaZOLAMIDE, topiramate, traZODone, LORazepam, traMADol, metoCLOPramide, OXYGEN, mometasone, albuterol, ondansetron, DULoxetine, beclomethasone, cloNIDine, and BYSTOLIC.  Meds ordered this encounter  Medications  . triamterene-hydrochlorothiazide (MAXZIDE-25) 37.5-25 MG tablet    Sig: Take 1 tablet by mouth daily.    Dispense:  90 tablet    Refill:  1     Follow-up: Return in about 6 weeks (around 10/03/2015).  Scarlette Calico, MD

## 2015-08-29 ENCOUNTER — Encounter: Payer: Self-pay | Admitting: Internal Medicine

## 2015-08-29 ENCOUNTER — Emergency Department (HOSPITAL_COMMUNITY)
Admission: EM | Admit: 2015-08-29 | Discharge: 2015-08-30 | Disposition: A | Discharge status: Home / Self Care | Payer: Medicare Other | Attending: Emergency Medicine | Admitting: Emergency Medicine

## 2015-08-29 ENCOUNTER — Encounter (HOSPITAL_COMMUNITY): Payer: Self-pay | Admitting: *Deleted

## 2015-08-29 ENCOUNTER — Emergency Department (HOSPITAL_COMMUNITY): Payer: Medicare Other

## 2015-08-29 DIAGNOSIS — J45909 Unspecified asthma, uncomplicated: Secondary | ICD-10-CM | POA: Diagnosis present

## 2015-08-29 DIAGNOSIS — I10 Essential (primary) hypertension: Secondary | ICD-10-CM | POA: Insufficient documentation

## 2015-08-29 DIAGNOSIS — R0602 Shortness of breath: Secondary | ICD-10-CM | POA: Diagnosis not present

## 2015-08-29 DIAGNOSIS — Z7951 Long term (current) use of inhaled steroids: Secondary | ICD-10-CM | POA: Insufficient documentation

## 2015-08-29 DIAGNOSIS — J45901 Unspecified asthma with (acute) exacerbation: Secondary | ICD-10-CM | POA: Diagnosis not present

## 2015-08-29 DIAGNOSIS — Z79899 Other long term (current) drug therapy: Secondary | ICD-10-CM | POA: Diagnosis not present

## 2015-08-29 DIAGNOSIS — Z853 Personal history of malignant neoplasm of breast: Secondary | ICD-10-CM | POA: Diagnosis not present

## 2015-08-29 MED ORDER — METHYLPREDNISOLONE SODIUM SUCC 125 MG IJ SOLR
125.0000 mg | Freq: Once | INTRAMUSCULAR | Status: AC
  Administered 2015-08-30: 125 mg via INTRAVENOUS
  Filled 2015-08-29: qty 2

## 2015-08-29 MED ORDER — SODIUM CHLORIDE 0.9 % IV SOLN
Freq: Once | INTRAVENOUS | Status: AC
  Administered 2015-08-30: via INTRAVENOUS

## 2015-08-29 MED ORDER — IPRATROPIUM-ALBUTEROL 0.5-2.5 (3) MG/3ML IN SOLN
3.0000 mL | Freq: Once | RESPIRATORY_TRACT | Status: AC
  Administered 2015-08-29: 3 mL via RESPIRATORY_TRACT
  Filled 2015-08-29: qty 3

## 2015-08-29 MED ORDER — ALBUTEROL SULFATE (2.5 MG/3ML) 0.083% IN NEBU
5.0000 mg | INHALATION_SOLUTION | Freq: Once | RESPIRATORY_TRACT | Status: AC
  Administered 2015-08-29: 5 mg via RESPIRATORY_TRACT
  Filled 2015-08-29: qty 6

## 2015-08-29 NOTE — ED Notes (Signed)
Pt states that she began having problems with her asthma yesterday; pt states that she had a cough and wheezing that was not being relieved by her breathing treatments at home; pt states that she emailed her MD today for some medication but did not receive a reply back; pt states that the tightness got worse this evening and she could not wait until in the am

## 2015-08-29 NOTE — ED Provider Notes (Signed)
CSN: [id number]     Arrival date & time 08/29/15  1924 History   First MD Initiated Contact with Patient 08/29/15 2322     Chief Complaint  Patient presents with  . Asthma     (Consider location/radiation/quality/duration/timing/severity/associated sxs/prior Treatment) HPI Comments: This a 40 year old female with a history of asthma who has been hospitalized in the past for asthma exacerbation.  Reports that today she noticed increased work of breathing, wheezing.  She's had multiple home treatments with her nebulizer of albuterol plus her normal inhalers which include Qvar with only temporary relief.  He also does have a history of CHF, hypertension, GERD, anemia, breast cancer and gastroparesis  Patient is a 40 y.o. female presenting with asthma. The history is provided by the patient.  Asthma This is a recurrent problem. The current episode started today. The problem occurs constantly. The problem has been gradually worsening. Associated symptoms include coughing. Pertinent negatives include no chest pain, fever or nausea. The symptoms are aggravated by exertion. Treatments tried: Multiple albuterol treatments. The treatment provided mild relief.    Past Medical History  Diagnosis Date  . Asthma   . Hypertension   . GERD (gastroesophageal reflux disease)   . Impaired glucose tolerance 04/13/2011  . Anemia, unspecified 04/13/2011  . Asthma 09/27/2010  . Bloating 03/18/2011  . 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 Lymphadenitis, chronic     restricted LEFT extremity  . Dyspnea on exertion     a. 01/2013 Lexi MV: EF 54%, no ischemia/infarct;  b. 05/2013 Echo: EF 60-65%, no rwma, Gr 2 DD;  b.   . OSA (obstructive sleep apnea) 05/04/2014    AHI 31/hr now on CPAP at 12cm H2O  . Breast cancer ([place]) 2010    a. locally advanced left breast carcinoma diagnosed in 2010 and treated with neoadjuvant chemotherapy with docetaxel and cyclophosphamide as well as paclitaxel. This was followed by  radiation therapy which was completed in 2011.  . Fatty liver   . Gastroparesis    Past Surgical History  Procedure Laterality Date  . Tendon release Right 2012    "hand"  . Breast lumpectomy Left 09/2008  . Carpal tunnel release Left 2011  . Tubal ligation  05/11/2011    Procedure: POST PARTUM TUBAL LIGATION;  Surgeon: Emeterio Reeve, MD;  Location: [facility];  Service: Gynecology;  Laterality: Bilateral;  Induced for HTN  . Lymph node dissection Left 2010    breast; 2 wks after breast lumpectomy   Family History  Problem Relation Age of Onset  . Diabetes Maternal Grandmother   . Heart disease Maternal Grandmother   . Hypertension Maternal Grandmother   . Cancer Neg Hx   . Alcohol abuse Neg Hx   . Early death Neg Hx   . Hyperlipidemia Neg Hx   . Kidney disease Neg Hx   . Stroke Neg Hx   . Colon cancer Neg Hx    Social History  Substance Use Topics  . Smoking status: Never Smoker   . Smokeless tobacco: Never Used  . Alcohol Use: 0.0 oz/week    0 Standard drinks or equivalent per week     Comment: occasionally   OB History    Gravida Para Term Preterm AB TAB SAB Ectopic Multiple Living   5 4 4  1  1   4      Review of Systems  Constitutional: Negative for fever.  Respiratory: Positive for cough and wheezing.   Cardiovascular: Negative for chest pain.  Gastrointestinal: Negative for nausea.  All other systems reviewed and are negative.     Allergies  Aspirin; Doxycycline; Penicillins; Kiwi extract; and Strawberry extract  Home Medications   Prior to Admission medications   Medication Sig Start Date End Date Taking? Authorizing Provider  acetaZOLAMIDE (DIAMOX) 250 MG tablet Take 4 tablets (1,000 mg total) by mouth 3 (three) times daily. 05/18/15  Yes Donika K Patel, DO  albuterol (PROVENTIL HFA;VENTOLIN HFA) 108 (90 BASE) MCG/ACT inhaler Inhale 2 puffs into the lungs every 4 (four) hours as needed for wheezing or shortness of breath (((PLAN A))). Patient taking  differently: Inhale 2 puffs into the lungs every 4 (four) hours as needed for wheezing or shortness of breath (((PLAN B))).  08/30/14  Yes Tammy S Parrett, NP  albuterol (PROVENTIL) (2.5 MG/3ML) 0.083% nebulizer solution Take 39ml every 4 hours and as needed for wheezing or shortness of breath. Dx: J45.99 07/19/15  Yes Tammy S Parrett, NP  beclomethasone (QVAR) 80 MCG/ACT inhaler Inhale 2 puffs into the lungs 2 (two) times daily. 07/24/15  Yes Tanda Rockers, MD  Biotin 10 MG TABS Take 1 tablet by mouth every morning.    Yes Historical Provider, MD  BYSTOLIC 20 MG TABS Take 20 mg by mouth daily. 07/23/15  Yes Historical Provider, MD  chlorpheniramine (CHLOR-TRIMETON) 4 MG tablet Take 8 mg by mouth at bedtime.   Yes Historical Provider, MD  cloNIDine (CATAPRES) 0.3 MG tablet TAKE 1 TABLET BY MOUTH TWICE DAILY 08/16/15  Yes [name], MD  dexlansoprazole (DEXILANT) 60 MG capsule Take 60 mg by mouth daily before breakfast.    Yes Historical Provider, MD  dextromethorphan-guaiFENesin (MUCINEX DM) 30-600 MG per 12 hr tablet Take 1 tablet by mouth 2 (two) times daily as needed (for cough, congestion, and thick mucus).    Yes Historical Provider, MD  diltiazem (CARDIZEM LA) 420 MG 24 hr tablet Take 1 tablet (420 mg total) by mouth daily. 04/07/15  Yes [name], MD  DULoxetine (CYMBALTA) 30 MG capsule Take 1 capsule (30 mg total) by mouth daily. 07/24/15  Yes [name], MD  EPINEPHrine 0.3 mg/0.3 mL IJ SOAJ injection Inject 0.3 mLs (0.3 mg total) into the muscle once. Patient taking differently: Use as directed for allergic reaction 01/28/14  Yes [name], MD  fluocinonide-emollient (LIDEX-E) 0.05 % cream Apply 1 application topically 2 (two) times daily. 05/09/15  Yes [name], MD  hydrALAZINE (APRESOLINE) 100 MG tablet Take 1 tablet (100 mg total) by mouth 3 (three) times daily. 11/08/14  Yes Biagio Borg, MD  isosorbide mononitrate (IMDUR) 60 MG 24 hr tablet Take 1 tablet (60 mg total)  by mouth daily. Patient taking differently: Take 60 mg by mouth every morning.  02/08/14  Yes [name], MD  LORazepam (ATIVAN) 0.5 MG tablet TAKE ONE TABLET BY MOUTH EVERY 8 HOURS AS NEEDED FOR ANXIETY 06/25/15  Yes Chauncey Cruel, MD  metoCLOPramide (REGLAN) 10 MG tablet Take 10 mg by mouth 4 (four) times daily.   Yes Historical Provider, MD  mometasone (NASONEX) 50 MCG/ACT nasal spray Place 2 sprays into the nose 2 (two) times daily. 07/18/15  Yes Tammy S Parrett, NP  montelukast (SINGULAIR) 10 MG tablet TAKE 1 TABLET BY MOUTH EVERY NIGHT AT BEDTIME 04/07/15  Yes Tanda Rockers, MD  nitroGLYCERIN (NITROSTAT) 0.4 MG SL tablet Place 1 tablet (0.4 mg total) under the tongue every 5 (five) minutes as needed for chest pain. 08/24/13  Yes [name], MD  ondansetron (ZOFRAN-ODT) 4 MG disintegrating tablet Take 1 tablet (4 mg total) by mouth every 8 (eight) hours as needed for nausea or vomiting. 07/24/15  Yes [name], MD  potassium chloride SA (K-DUR,KLOR-CON) 20 MEQ tablet Take 20 mEq by mouth 3 (three) times daily.   Yes Historical Provider, MD  pregabalin (LYRICA) 200 MG capsule Take 1 capsule (200 mg total) by mouth 3 (three) times daily. 01/23/15  Yes Donika K Patel, DO  primidone (MYSOLINE) 50 MG tablet Take 0.5 tablets (25 mg total) by mouth at bedtime. Patient taking differently: Take 25 mg by mouth every morning.  01/23/15  Yes Donika K Patel, DO  SYMBICORT 160-4.5 MCG/ACT inhaler INHALE 2 PUFFS BY MOUTH TWICE DAILY 01/23/15  Yes Tammy S Parrett, NP  topiramate (TOPAMAX) 25 MG tablet Take 1 tablet daily for one week, then increase to 1 tablet twice daily. Patient taking differently: Take 25 mg by mouth 2 (two) times daily.  05/18/15  Yes Donika K Patel, DO  traMADol (ULTRAM) 50 MG tablet Take 1 tablet (50 mg total) by mouth every 4 (four) hours as needed (cough). 06/29/15  Yes Tanda Rockers, MD  traZODone (DESYREL) 150 MG tablet Take 1 tablet (150 mg total) by mouth at  bedtime. 06/15/15  Yes [name], MD  triamterene-hydrochlorothiazide (MAXZIDE-25) 37.5-25 MG tablet Take 1 tablet by mouth daily. 08/22/15  Yes [name], MD  OXYGEN Use CPAP at bedtime    Historical Provider, MD  predniSONE (DELTASONE) 10 MG tablet Take 2 tablets (20 mg total) by mouth daily with breakfast. 08/30/15   Junius Creamer, NP  Respiratory Therapy Supplies (FLUTTER) DEVI Use as directed 03/24/15   Tanda Rockers, MD   BP 229/99 mmHg  Pulse 104  Temp(Src) 98.2 F (36.8 C) (Oral)  Resp 26  Ht 5\' 6"  (1.676 m)  Wt 92.534 kg  BMI 32.94 kg/m2  SpO2 93% Physical Exam  Constitutional: She appears well-developed and well-nourished.  HENT:  Head: Normocephalic.  Eyes: Pupils are equal, round, and reactive to light.  Neck: Normal range of motion.  Cardiovascular: Regular rhythm.  Tachycardia present.   Pulmonary/Chest: She is in respiratory distress. She has wheezes.  Musculoskeletal: Normal range of motion.  Neurological: She is alert.  Skin: Skin is warm.  Nursing note and vitals reviewed.   ED Course  Procedures (including critical care time) Labs Review Labs Reviewed - No data to display  Imaging Review Dg Chest 2 View  08/29/2015  CLINICAL DATA:  Asthma, shortness of breath EXAM: CHEST  2 VIEW COMPARISON:  03/24/2015 FINDINGS: Lungs are clear.  No pleural effusion or pneumothorax. The heart is normal in size. Visualized osseous structures are within normal limits. IMPRESSION: No evidence of acute cardiopulmonary disease. Electronically Signed   By: Julian Hy M.D.   On: 08/29/2015 23:57   I have personally reviewed and evaluated these images and lab results as part of my medical decision-making.   EKG Interpretation None     Patient reexamined after her albuterol treatments, IV steroids.  She's been ambulated.  No exacerbation of her wheezing.  She states that she does feel much better.  She states that she has medication at home and will fill the  prescription for prednisone in the morning MDM   Final diagnoses:  Asthma exacerbation         Junius Creamer, NP 08/30/15 [id number]  Veatrice Kells, MD 08/30/15 [id number]

## 2015-08-30 ENCOUNTER — Encounter: Payer: Self-pay | Admitting: Cardiology

## 2015-08-30 ENCOUNTER — Encounter: Payer: Self-pay | Admitting: Internal Medicine

## 2015-08-30 ENCOUNTER — Other Ambulatory Visit: Payer: Self-pay | Admitting: Internal Medicine

## 2015-08-30 MED ORDER — PREDNISONE 10 MG PO TABS: 20.0000 mg | ORAL_TABLET | Freq: Every day | ORAL | Status: DC

## 2015-08-30 NOTE — Discharge Instructions (Signed)
Today your chest x-ray is normal.  Event treated for your asthma exacerbation U been given a prescription for prednisone .  Please take this as directed until all tablets have been completed .  Return anytime you develop worsening symptoms   Asthma Attack Prevention While you may not be able to control the fact that you have asthma, you can take actions to prevent asthma attacks. The best way to prevent asthma attacks is to maintain good control of your asthma. You can achieve this by:  Taking your medicines as directed.  Avoiding things that can irritate your airways or make your asthma symptoms worse (asthma triggers).  Keeping track of how well your asthma is controlled and of any changes in your symptoms.  Responding quickly to worsening asthma symptoms (asthma attack).  Seeking emergency care when it is needed. WHAT ARE SOME WAYS TO PREVENT AN ASTHMA ATTACK? Have a Plan Work with your health care provider to create a written plan for managing and treating your asthma attacks (asthma action plan). This plan includes:  A list of your asthma triggers and how you can avoid them.  Information on when medicines should be taken and when their dosages should be changed.  The use of a device that measures how well your lungs are working (peak flow meter). Monitor Your Asthma Use your peak flow meter and record your results in a journal every day. A drop in your peak flow numbers on one or more days may indicate the start of an asthma attack. This can happen even before you start to feel symptoms. You can prevent an asthma attack from getting worse by following the steps in your asthma action plan. Avoid Asthma Triggers Work with your asthma health care provider to find out what your asthma triggers are. This can be done by:  Allergy testing.  Keeping a journal that notes when asthma attacks occur and the factors that may have contributed to them.  Determining if there are other medical  conditions that are making your asthma worse. Once you have determined your asthma triggers, take steps to avoid them. This may include avoiding excessive or prolonged exposure to:  Dust. Have someone dust and vacuum your home for you once or twice a week. Using a high-efficiency particulate arrestance (HEPA) vacuum is best.  Smoke. This includes campfire smoke, forest fire smoke, and secondhand smoke from tobacco products.  Pet dander. Avoid contact with animals that you know you are allergic to.  Allergens from trees, grasses or pollens. Avoid spending a lot of time outdoors when pollen counts are high, and on very windy days.  Very cold, dry, or humid air.  Mold.  Foods that contain high amounts of sulfites.  Strong odors.  Outdoor air pollutants, such as Lexic[employer].  Indoor air pollutants, such as aerosol sprays and fumes from household cleaners.  Household pests, including dust mites and cockroaches, and pest droppings.  Certain medicines, including NSAIDs. Always talk to your health care provider before stopping or starting any new medicines. Medicines Take over-the-counter and prescription medicines only as told by your health care provider. Many asthma attacks can be prevented by carefully following your medicine schedule. Taking your medicines correctly is especially important when you cannot avoid certain asthma triggers. Act Quickly If an asthma attack does happen, acting quickly can decrease how severe it is and how long it lasts. Take these steps:   Pay attention to your symptoms. If you are coughing, wheezing, or having difficulty breathing,  do not wait to see if your symptoms go away on their own. Follow your asthma action plan.  If you have followed your asthma action plan and your symptoms are not improving, call your health care provider or seek immediate medical care at the nearest hospital. It is important to note how often you need to use your fast-acting  rescue inhaler. If you are using your rescue inhaler more often, it may mean that your asthma is not under control. Adjusting your asthma treatment plan may help you to prevent future asthma attacks and help you to gain better control of your condition. HOW CAN I PREVENT AN ASTHMA ATTACK WHEN I EXERCISE? Follow advice from your health care provider about whether you should use your fast-acting inhaler before exercising. Many people with asthma experience exercise-induced bronchoconstriction (EIB). This condition often worsens during vigorous exercise in cold, humid, or dry environments. Usually, people with EIB can stay very active by pre-treating with a fast-acting inhaler before exercising.   This information is not intended to replace advice given to you by your health care provider. Make sure you discuss any questions you have with your health care provider.   Document Released: 03/06/2009 Document Revised: 12/07/2014 Document Reviewed: 08/18/2014 Elsevier Interactive Patient Education 2016 [employer].  Asthma, Adult Asthma is a recurring condition in which the airways tighten and narrow. Asthma can make it difficult to breathe. It can cause coughing, wheezing, and shortness of breath. Asthma episodes, also called asthma attacks, range from minor to life-threatening. Asthma cannot be cured, but medicines and lifestyle changes can help control it. CAUSES Asthma is believed to be caused by inherited (genetic) and environmental factors, but its exact cause is unknown. Asthma may be triggered by allergens, lung infections, or irritants in the air. Asthma triggers are different for each person. Common triggers include:   Animal dander.  Dust mites.  Cockroaches.  Pollen from trees or grass.  Mold.  Smoke.  Air pollutants such as dust, household cleaners, hair sprays, aerosol sprays, paint fumes, strong chemicals, or strong odors.  Cold air, weather changes, and winds (which increase  molds and pollens in the air).  Strong emotional expressions such as crying or laughing hard.  Stress.  Certain medicines (such as aspirin) or types of drugs (such as beta-blockers).  Sulfites in foods and drinks. Foods and drinks that may contain sulfites include dried fruit, potato chips, and sparkling grape juice.  Infections or inflammatory conditions such as the flu, a cold, or an inflammation of the nasal membranes (rhinitis).  Gastroesophageal reflux disease (GERD).  Exercise or strenuous activity. SYMPTOMS Symptoms may occur immediately after asthma is triggered or many hours later. Symptoms include:  Wheezing.  Excessive nighttime or early morning coughing.  Frequent or severe coughing with a common cold.  Chest tightness.  Shortness of breath. DIAGNOSIS  The diagnosis of asthma is made by a review of your medical history and a physical exam. Tests may also be performed. These may include:  Lung function studies. These tests show how much air you breathe in and out.  Allergy tests.  Imaging tests such as X-rays. TREATMENT  Asthma cannot be cured, but it can usually be controlled. Treatment involves identifying and avoiding your asthma triggers. It also involves medicines. There are 2 classes of medicine used for asthma treatment:   Controller medicines. These prevent asthma symptoms from occurring. They are usually taken every day.  Reliever or rescue medicines. These quickly relieve asthma symptoms. They are used as  needed and provide short-term relief. Your health care provider will help you create an asthma action plan. An asthma action plan is a written plan for managing and treating your asthma attacks. It includes a list of your asthma triggers and how they may be avoided. It also includes information on when medicines should be taken and when their dosage should be changed. An action plan may also involve the use of a device called a peak flow meter. A peak  flow meter measures how well the lungs are working. It helps you monitor your condition. HOME CARE INSTRUCTIONS   Take medicines only as directed by your health care provider. Speak with your health care provider if you have questions about how or when to take the medicines.  Use a peak flow meter as directed by your health care provider. Record and keep track of readings.  Understand and use the action plan to help minimize or stop an asthma attack without needing to seek medical care.  Control your home environment in the following ways to help prevent asthma attacks:  Do not smoke. Avoid being exposed to secondhand smoke.  Change your heating and air conditioning filter regularly.  Limit your use of fireplaces and wood stoves.  Get rid of pests (such as roaches and mice) and their droppings.  Throw away plants if you see mold on them.  Clean your floors and dust regularly. Use unscented cleaning products.  Try to have someone else vacuum for you regularly. Stay out of rooms while they are being vacuumed and for a short while afterward. If you vacuum, use a dust mask from a hardware store, a double-layered or microfilter vacuum cleaner bag, or a vacuum cleaner with a HEPA filter.  Replace carpet with wood, tile, or vinyl flooring. Carpet can trap dander and dust.  Use allergy-proof pillows, mattress covers, and box spring covers.  Wash bed sheets and blankets every week in hot water and dry them in a dryer.  Use blankets that are made of polyester or cotton.  Clean bathrooms and kitchens with bleach. If possible, have someone repaint the walls in these rooms with mold-resistant paint. Keep out of the rooms that are being cleaned and painted.  Wash hands frequently. SEEK MEDICAL CARE IF:   You have wheezing, shortness of breath, or a cough even if taking medicine to prevent attacks.  The colored mucus you cough up (sputum) is thicker than usual.  Your sputum changes from  clear or white to yellow, green, gray, or bloody.  You have any problems that may be related to the medicines you are taking (such as a rash, itching, swelling, or trouble breathing).  You are using a reliever medicine more than 2-3 times per week.  Your peak flow is still at 50-79% of your personal best after following your action plan for 1 hour.  You have a fever. SEEK IMMEDIATE MEDICAL CARE IF:   You seem to be getting worse and are unresponsive to treatment during an asthma attack.  You are short of breath even at rest.  You get short of breath when doing very little physical activity.  You have difficulty eating, drinking, or talking due to asthma symptoms.  You develop chest pain.  You develop a fast heartbeat.  You have a bluish color to your lips or fingernails.  You are light-headed, dizzy, or faint.  Your peak flow is less than 50% of your personal best.   This information is not intended to replace  advice given to you by your health care provider. Make sure you discuss any questions you have with your health care provider.   Document Released: 03/18/2005 Document Revised: 12/07/2014 Document Reviewed: 10/15/2012 Elsevier Interactive Patient Education [employer].

## 2015-08-30 NOTE — ED Notes (Signed)
Pt ambulated in hallway O2 was 95 % and pulse was 99

## 2015-08-30 NOTE — Telephone Encounter (Signed)
Pt is scheduled on the flex schedule for tomorrow at 0800 with Ignacia Bayley NP to rule out heart failure per Dr Meda Coffee.  This is for pt complaining of ongoing cough and LEE.  Pt aware of appt date and time, and agrees with this plan.

## 2015-08-31 ENCOUNTER — Ambulatory Visit (INDEPENDENT_AMBULATORY_CARE_PROVIDER_SITE_OTHER): Payer: Medicare Other | Admitting: Internal Medicine

## 2015-08-31 ENCOUNTER — Encounter: Payer: Self-pay | Admitting: Internal Medicine

## 2015-08-31 ENCOUNTER — Encounter: Payer: Self-pay | Admitting: Physician Assistant

## 2015-08-31 ENCOUNTER — Ambulatory Visit (INDEPENDENT_AMBULATORY_CARE_PROVIDER_SITE_OTHER): Payer: Medicare Other | Admitting: Physician Assistant

## 2015-08-31 ENCOUNTER — Ambulatory Visit (HOSPITAL_BASED_OUTPATIENT_CLINIC_OR_DEPARTMENT_OTHER): Payer: Medicare Other

## 2015-08-31 ENCOUNTER — Telehealth: Payer: Self-pay | Admitting: Internal Medicine

## 2015-08-31 VITALS — BP 137/77 | HR 77 | Temp 98.9°F

## 2015-08-31 VITALS — BP 140/70 | HR 85 | Ht 66.0 in | Wt 203.6 lb

## 2015-08-31 VITALS — BP 138/68 | HR 77 | Ht 66.0 in | Wt 201.2 lb

## 2015-08-31 DIAGNOSIS — G4733 Obstructive sleep apnea (adult) (pediatric): Secondary | ICD-10-CM

## 2015-08-31 DIAGNOSIS — R05 Cough: Secondary | ICD-10-CM

## 2015-08-31 DIAGNOSIS — J45991 Cough variant asthma: Secondary | ICD-10-CM | POA: Diagnosis not present

## 2015-08-31 DIAGNOSIS — Z5111 Encounter for antineoplastic chemotherapy: Secondary | ICD-10-CM | POA: Diagnosis not present

## 2015-08-31 DIAGNOSIS — R0602 Shortness of breath: Secondary | ICD-10-CM | POA: Diagnosis not present

## 2015-08-31 DIAGNOSIS — Z9989 Dependence on other enabling machines and devices: Secondary | ICD-10-CM

## 2015-08-31 DIAGNOSIS — I10 Essential (primary) hypertension: Secondary | ICD-10-CM

## 2015-08-31 DIAGNOSIS — R002 Palpitations: Secondary | ICD-10-CM

## 2015-08-31 DIAGNOSIS — C50412 Malignant neoplasm of upper-outer quadrant of left female breast: Secondary | ICD-10-CM | POA: Diagnosis not present

## 2015-08-31 DIAGNOSIS — J9601 Acute respiratory failure with hypoxia: Secondary | ICD-10-CM

## 2015-08-31 DIAGNOSIS — I5032 Chronic diastolic (congestive) heart failure: Secondary | ICD-10-CM

## 2015-08-31 DIAGNOSIS — R0600 Dyspnea, unspecified: Secondary | ICD-10-CM | POA: Diagnosis not present

## 2015-08-31 DIAGNOSIS — R058 Other specified cough: Secondary | ICD-10-CM

## 2015-08-31 MED ORDER — PREDNISONE 10 MG PO TABS: ORAL_TABLET | ORAL | Status: DC

## 2015-08-31 MED ORDER — GOSERELIN ACETATE 3.6 MG ~~LOC~~ IMPL
3.6000 mg | DRUG_IMPLANT | Freq: Once | SUBCUTANEOUS | Status: AC
  Administered 2015-08-31: 3.6 mg via SUBCUTANEOUS
  Filled 2015-08-31: qty 3.6

## 2015-08-31 MED ORDER — TRAMADOL HCL 50 MG PO TABS: ORAL_TABLET | ORAL | Status: DC

## 2015-08-31 NOTE — Telephone Encounter (Signed)
Pt reports having an asthma flare currently. Pt having increased SOB, wheezing and little cough with some clear mucus. States that she was seen in the ED over the weekend for the same issues and had a cxr which came back clear. Reports O2 dropping to 86% today. Please advise Dr Melvyn Novas. Thanks.   Pt never followed up with TP as directed.   Patient Instructions 06/29/15     Prednisone 10 mg take 4 each am x 2 days, 2 each am x 2 days, 1 each am x 2 days and stop   Qvar 80 Take 2 puffs first thing in am and then another 2 puffs about 12 hours later.   Change dexilant Take 30-60 min before first meal of the day and Add Pepcid 20 mg at bedtime   For drainage / throat tickle try take CHLORPHENIRAMINE 4 mg - take one every 4 hours as needed - available over the counter- may cause drowsiness so start with just a bedtime dose or two and see how you tolerate it before trying in daytime   See Tammy NP w/in 2 weeks(or next available after than) with all your medications, even over the counter meds, separated in two separate bags, the ones you take no matter what vs the ones you stop once you feel better and take only as needed when you feel you need them. Tammy will generate for you a new user friendly medication calendar that will put [place] all on the same page re: your medication use.    Without this process, it simply isn't possible to assure that we are providing your outpatient care with the attention to detail we feel you deserve. If we cannot assure that you're getting that kind of care, then we cannot manage your problem effectively from this clinic.  Once you have seen Tammy and we are sure that we're all on the same page with your medication use she will arrange follow up with me.  Add: Count the qvar 80 at f/u (given one sample = 120 pffs)

## 2015-08-31 NOTE — Telephone Encounter (Signed)
Spoke with pt. She has been added on to [facility] scheduled today at 1:30pm. Nothing further was needed.

## 2015-08-31 NOTE — Progress Notes (Signed)
Cardiology Office Note    Date:  08/31/2015   ID:  Erica [name], DOB [birth year], MRN [id number]  PCP:  Scarlette Calico, MD  Cardiologist:  Meda Coffee  Chief Complaint: Dyspnea and cough  History of Present Illness:   Erica [name] is a 40 y.o. female HTN, severe OSA on CPAP, asthma, GERD, gastroparesis, chronic diastolic CHF and syncope (felt due to narcolepsy, sleep apnea) who presents for    Hx of left breast carcinoma diagnosed in 2010 and treated with neoadjuvant chemotherapy with docetaxel and cyclophosphamide as well as paclitaxel. This was followed by radiation therapy which was completed in 2011.  Normal nuclear study 01/2013. Echo 05/2013 showed LV EF of 60-65%, grade 2 DD.   She has hx of asthma all her life that has worsen since around 07/2010. Followed by Dr. Mertha Finders. Seen in ED 5/20 & 5/30 for asthma exacerbation. Seen by PCP 08/22/15 for BP check and her meds were adjusted.   Since seen last in ER her breathing has not improved. She is compliant with CPAP. She is using her inhaler and nebulizing more often (suppose to use every 4 hours). She feels palpitations. She is more dyspneic with exertion. She is cannot lay flat (chronic) however waking at night with SOB ---> uses her nebulizer and feels better and goes to sleep. No syncope. Intermittent dizziness.    Past Medical History  Diagnosis Date  . Asthma   . Hypertension   . GERD (gastroesophageal reflux disease)   . Impaired glucose tolerance 04/13/2011  . Anemia, unspecified 04/13/2011  . Asthma 09/27/2010  . Bloating 03/18/2011  . 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 Lymphadenitis, chronic     restricted LEFT extremity  . Dyspnea on exertion     a. 01/2013 Lexi MV: EF 54%, no ischemia/infarct;  b. 05/2013 Echo: EF 60-65%, no rwma, Gr 2 DD;  b.   . OSA (obstructive sleep apnea) 05/04/2014    AHI 31/hr now on CPAP at 12cm H2O  . Breast cancer ([place]) 2010    a. locally advanced left breast carcinoma diagnosed in 2010 and  treated with neoadjuvant chemotherapy with docetaxel and cyclophosphamide as well as paclitaxel. This was followed by radiation therapy which was completed in 2011.  . Fatty liver   . Gastroparesis     Past Surgical History  Procedure Laterality Date  . Tendon release Right 2012    "hand"  . Breast lumpectomy Left 09/2008  . Carpal tunnel release Left 2011  . Tubal ligation  05/11/2011    Procedure: POST PARTUM TUBAL LIGATION;  Surgeon: Emeterio Reeve, MD;  Location: [facility];  Service: Gynecology;  Laterality: Bilateral;  Induced for HTN  . Lymph node dissection Left 2010    breast; 2 wks after breast lumpectomy    Current Medications: Prior to Admission medications   Medication Sig Start Date End Date Taking? Authorizing Provider  acetaZOLAMIDE (DIAMOX) 250 MG tablet Take 4 tablets (1,000 mg total) by mouth 3 (three) times daily. 05/18/15   Donika K Patel, DO  albuterol (PROVENTIL HFA;VENTOLIN HFA) 108 (90 Base) MCG/ACT inhaler Inhale 2 puffs into the lungs every 4 (four) hours as needed for wheezing or shortness of breath.    Historical Provider, MD  albuterol (PROVENTIL) (2.5 MG/3ML) 0.083% nebulizer solution USE 1 VIAL VIA NEBULIZER EVERY 4 HOURS AS NEEDED( NEED APPOINTMENT) 08/30/15   Tanda Rockers, MD  beclomethasone (QVAR) 80 MCG/ACT inhaler Inhale 2 puffs into the lungs 2 (two) times daily.  07/24/15   Tanda Rockers, MD  Biotin 10 MG TABS Take 1 tablet by mouth every morning.     Historical Provider, MD  BYSTOLIC 20 MG TABS Take 20 mg by mouth daily. 07/23/15   Historical Provider, MD  chlorpheniramine (CHLOR-TRIMETON) 4 MG tablet Take 8 mg by mouth at bedtime.    Historical Provider, MD  cloNIDine (CATAPRES) 0.3 MG tablet TAKE 1 TABLET BY MOUTH TWICE DAILY 08/16/15   [name], MD  dexlansoprazole (DEXILANT) 60 MG capsule Take 60 mg by mouth daily before breakfast.     Historical Provider, MD  dextromethorphan-guaiFENesin ([place] DM) 30-600 MG per 12 hr tablet Take 1 tablet by  mouth 2 (two) times daily as needed (for cough, congestion, and thick mucus).     Historical Provider, MD  diltiazem (CARDIZEM LA) 420 MG 24 hr tablet Take 1 tablet (420 mg total) by mouth daily. 04/07/15   [name], MD  DULoxetine (CYMBALTA) 30 MG capsule Take 1 capsule (30 mg total) by mouth daily. 07/24/15   [name], MD  EPINEPHrine 0.3 mg/0.3 mL IJ SOAJ injection Inject 0.3 mg into the muscle once as needed (anaphylaxis reaction).    Historical Provider, MD  fluocinonide-emollient (LIDEX-E) 0.05 % cream Apply 1 application topically 2 (two) times daily. 05/09/15   [name], MD  hydrALAZINE (APRESOLINE) 100 MG tablet Take 1 tablet (100 mg total) by mouth 3 (three) times daily. 11/08/14   Biagio Borg, MD  isosorbide mononitrate (IMDUR) 60 MG 24 hr tablet Take 60 mg by mouth every morning.    Historical Provider, MD  LORazepam (ATIVAN) 0.5 MG tablet TAKE ONE TABLET BY MOUTH EVERY 8 HOURS AS NEEDED FOR ANXIETY 06/25/15   Chauncey Cruel, MD  metoCLOPramide (REGLAN) 10 MG tablet Take 10 mg by mouth 4 (four) times daily.    Historical Provider, MD  mometasone (NASONEX) 50 MCG/ACT nasal spray Place 2 sprays into the nose 2 (two) times daily. 07/18/15   Tammy S Parrett, NP  montelukast (SINGULAIR) 10 MG tablet TAKE 1 TABLET BY MOUTH EVERY NIGHT AT BEDTIME 04/07/15   Tanda Rockers, MD  nitroGLYCERIN (NITROSTAT) 0.4 MG SL tablet Place 1 tablet (0.4 mg total) under the tongue every 5 (five) minutes as needed for chest pain. 08/24/13   [name], MD  ondansetron (ZOFRAN-ODT) 4 MG disintegrating tablet Take 1 tablet (4 mg total) by mouth every 8 (eight) hours as needed for nausea or vomiting. 07/24/15   [name], MD  OXYGEN Use CPAP at bedtime    Historical Provider, MD  potassium chloride SA (K-DUR,KLOR-CON) 20 MEQ tablet Take 20 mEq by mouth 3 (three) times daily.    Historical Provider, MD  predniSONE (DELTASONE) 10 MG tablet Take 2 tablets (20 mg total) by mouth daily with  breakfast. 08/30/15   Junius Creamer, NP  pregabalin (LYRICA) 200 MG capsule Take 1 capsule (200 mg total) by mouth 3 (three) times daily. 01/23/15   Donika Keith Rake, DO  primidone (MYSOLINE) 50 MG tablet Take 25 mg by mouth every morning.    Historical Provider, MD  Respiratory Therapy Supplies (FLUTTER) DEVI Use as directed 03/24/15   Tanda Rockers, MD  SYMBICORT 160-4.5 MCG/ACT inhaler INHALE 2 PUFFS BY MOUTH TWICE DAILY 01/23/15   Tammy S Parrett, NP  topiramate (TOPAMAX) 25 MG tablet Take 25 mg by mouth 2 (two) times daily.    Historical Provider, MD  traMADol (ULTRAM) 50 MG tablet Take  1 tablet (50 mg total) by mouth every 4 (four) hours as needed (cough). 06/29/15   Tanda Rockers, MD  traZODone (DESYREL) 150 MG tablet Take 1 tablet (150 mg total) by mouth at bedtime. 06/15/15   [name], MD  triamterene-hydrochlorothiazide (MAXZIDE-25) 37.5-25 MG tablet Take 1 tablet by mouth daily. 08/22/15   [name], MD    Allergies:   Aspirin; Doxycycline; Penicillins; Kiwi extract; and Strawberry extract   Social History   Social History  . Marital Status: Single    Spouse Name: N/A  . Number of Children: 3  . Years of Education: N/A   Occupational History  .     Social History Main Topics  . Smoking status: Never Smoker   . Smokeless tobacco: Never Used  . Alcohol Use: 0.0 oz/week    0 Standard drinks or equivalent per week     Comment: occasionally  . Drug Use: No  . Sexual Activity: Yes    Birth Control/ Protection: Surgical   Other Topics Concern  . None   Social History Narrative   She lives with four children.   She is currently not working (disbaility pending).           Family History:  The patient's family history includes Diabetes in her maternal grandmother; Heart disease in her maternal grandmother; Hypertension in her maternal grandmother. There is no history of Cancer, Alcohol abuse, Early death, Hyperlipidemia, Kidney disease, Stroke, or Colon cancer.    ROS:   Please see the history of present illness.    Review of Systems  Constitution: Positive for decreased appetite and weakness. Negative for fever and weight gain.  HENT: Negative for congestion.   Eyes: Negative for visual disturbance.  Cardiovascular: Positive for dyspnea on exertion, leg swelling, orthopnea, palpitations and paroxysmal nocturnal dyspnea. Negative for chest pain and syncope.  Respiratory: Positive for cough, shortness of breath, sleep disturbances due to breathing, snoring and wheezing. Negative for hemoptysis.   Musculoskeletal: Positive for myalgias.  Gastrointestinal: Negative for abdominal pain and diarrhea.  Genitourinary: Negative for dysuria, frequency and urgency.  Neurological: Positive for dizziness, light-headedness and loss of balance.   All other systems reviewed and are negative.   PHYSICAL EXAM:   VS:  BP 138/68 mmHg  Pulse 77  Ht 5\' 6"  (1.676 m)  Wt 201 lb 3.2 oz (91.264 kg)  BMI 32.49 kg/m2  SpO2 91%   GEN: Well nourished, well developed, mild acute distress HEENT: normal Neck: no JVD, carotid bruits, or masses Cardiac: RRR; no murmurs, rubs, or gallops,no edema  Respiratory:  Increase work of breathing, diminished breath sound throughout with expiratory wheezing no rales  GI: soft, nontender, nondistended, + BS MS: no deformity or atrophy Skin: warm and dry, no rash Neuro:  Alert and Oriented x 3, Strength and sensation are intact Psych: euthymic mood, full affect  Wt Readings from Last 3 Encounters:  08/31/15 201 lb 3.2 oz (91.264 kg)  08/29/15 204 lb (92.534 kg)  08/22/15 202 lb (91.627 kg)    With 10 step walking her SpO2% dropped to 86%  Studies/Labs Reviewed:   EKG:  EKG is not ordered today.   Recent Labs: 09/20/2014: ALT 27 06/09/2015: Hemoglobin 12.7; Platelets 244 06/15/2015: TSH 1.06 08/22/2015: BUN 11; Creatinine, Ser 1.12; Magnesium 2.0; Potassium 4.0; Sodium 145   Lipid Panel    Component Value Date/Time   CHOL  206* 06/15/2015 1645   TRIG 110.0 06/15/2015 1645   HDL 44.10 06/15/2015 1645  CHOLHDL 5 06/15/2015 1645   VLDL 22.0 06/15/2015 1645   LDLCALC 140* 06/15/2015 1645    Additional studies/ records that were reviewed today include:   Nuclear stress test: 02/15/2013 Impression  Exercise Capacity: Lexiscan with no exercise.  BP Response: Hypertensive blood pressure response.  Clinical Symptoms: Chest pain, headache.  ECG Impression: No significant ST segment change suggestive of ischemia.  Comparison with Prior Nuclear Study: No images to compare  Overall Impression: Normal stress nuclear study.  LV Ejection Fraction: 54%. LV Wall Motion: NL LV Function; NL Wall Motion  Erica [name]  02/12/2013   TTE 06/17/2013  Left ventricle: The cavity size was normal. There was mild concentric hypertrophy. Systolic function was normal. The estimated ejection fraction was in the range of 60% to 65%. Wall motion was normal; there were no regional wall motion abnormalities. Features are consistent with a pseudonormal left ventricular filling pattern, with concomitant abnormal relaxation and increased filling pressure (grade 2 diastolic dysfunction). Doppler parameters are consistent with high ventricular filling pressure. Impressions: - When compared to 2010, EF has improved  Chest x ray 08/29/15  FINDINGS: Lungs are clear. No pleural effusion or pneumothorax.  The heart is normal in size.  Visualized osseous structures are within normal limits.  IMPRESSION: No evidence of acute cardiopulmonary disease.   ASSESSMENT & PLAN:    1. Dyspnea with hypoxia and wheezing - Seems recently worsen asthma. Seen in ER x 2 in last 2 weeks. Symptoms has not improved. Still wheezing and dyspneic. Chest xray 5/30 showed no acute cardiopulmonary disease. She in not volume overloaded on exam and has severe wheezing with diminished breath sound.  Oxygen saturation of 91% on sitting however  it was dropped to 86% with 10 steps of walking. Given No improvement on po steroids and now with hypoxia, she will need internal medicine admission for IV steroids +/- antibiotics. Consider BNP check in ER. Her family member will drove her directly to [facility] for further evaluation of dyspnea. May need pulmonary evaluation if no improvement.   2. Chronic diastolic dysfunction - She is euvolemic on exam. Seems her symptoms likely due to pulmonary issue. Echo 05/2013 showed LV EF of 60-65%, grade 2 DD. Will get echo in near future.   3. HTN - BP is stable today. Continue current regimen.   4. Palpitation - Likely due to excessive use of inhaler and nebulizer  5. Severe OSA - Compliant with CPAP. States that she is scheduled for repeat sleep study in upcoming months.     Medication Adjustments/Labs and Tests Ordered: Current medicines are reviewed at length with the patient today.  Concerns regarding medicines are outlined above.  Medication changes, Labs and Tests ordered today are listed in the Patient Instructions below. Patient Instructions  Medication Instructions:  Your physician recommends that you continue on your current medications as directed. Please refer to the Current Medication list given to you today.   Labwork: Your physician recommends that you have lab work today: bnp   Testing/Procedures: Your physician has requested that you have an echocardiogram. Echocardiography is a painless test that uses sound waves to create images of your heart. It provides your doctor with information about the size and shape of your heart and how well your heart's chambers and valves are working. This procedure takes approximately one hour. There are no restrictions for this procedure.    Follow-Up: Your physician recommends that you keep your scheduled  follow-up appointment with Dr. Meda Coffee   Any Other  Special Instructions Will Be Listed Below (If Applicable).  If the shortness of  breath does not get better in 2-3 days please follow up with pulmonary  or Primary care   If you need a refill on your cardiac medications before your next appointment, please call your pharmacy.       Jarrett Soho, [place]  08/31/2015 9:25 AM    [address], [place], [place]  [address] Phone: [phone]; Fax: [phone]

## 2015-08-31 NOTE — Telephone Encounter (Signed)
Needs to be added on and bring all meds with her to see NP first as did not f/u  as prev rec with ? Is she using the qvar as rec

## 2015-08-31 NOTE — Patient Instructions (Signed)
Prednisone 40 mg x 4 days and 20 mg x 4 days and 10 mg daily x 4 days and off  Add pepcid 20 mg one at bedtime   For cough > mucinex dm up to 1200 mg every 12 hours, supplement with tramadol and use the flutter valve as much as possible   Please schedule a follow up office visit in 4 weeks, sooner if needed with all meds and the med calendar

## 2015-08-31 NOTE — Progress Notes (Signed)
Subjective:    Patient ID: Erica [name], female     DOB: 01-Oct-1975    MRN: [id number]    Brief patient profile:  6 yobf with asthma all her life much worse since around 07/2010 referred by Dr Erica [name] to pulmonary clinic in Sept 2012 Hx of Breast Cancer 05/2008    History of Present Illness  12/18/2010 Initial pulmonary office eval on acei and  advair cc dtc asthma x 4 months but baseline = excess saba use "even when better" at least twice daily despite daily advair, worse at hs in fact  used saba 3 x last night.  On chemo /RT for breast ca but last rx was Jan 2012 well before asthma worse.  To er multiple times >  no better with singular added x 3 months. rec Stop Lisinopril Dulera 200 Take 2 puffs first thing in am and then another 2 puffs about 12 hours later.  Work on inhaler technique  Nexium 40 mg   Take 30-60 min before first meal of the day and Pepcid 20 mg one bedtime until return Only use your albuterol as a rescue medication to be used if you can't catch your breath by resting or doing a relaxed purse lip breathing pattern. The less you use it, the better it will work when you need it.  Prednisone 10 mg take  4 each am x 2 days,   2 each am x 2 days,  1 each am x2days and stop    01/03/2011 f/u ov/Erica [name] cc much better off ace and advair but still needing saba twice daily on avg. No  Purulent sputum. rec no change in rx/ rule of 2's reviewed     04/06/2013 [name] Follow up and Med Review   We reviewed all her meds and organized them into a med calendar. She does have Medicaid but depends on samples. Did not bring all her meds with her today , says they are at home.  Says her breathing is the same since last ov w/ SOB, Has on /off wheezing, some chest heaviness, prod cough with yellow mucus. Good and bad days. Wears out easily .  Denies f/c/s, nausea, vomiting.  Did not stop singulair as recommended.  rec  Follow med calendar closely and bring to each visit > did not do  We are  referring you for a CPSTwith spirometry before and after> never done   04/18/2014 [name] Follow up and Med Review / [name]  Patient returns for a two-week follow-up and medication review. We reviewed all her medications organize them into a medication count with patient education It appears that she is taking her medications correctly, however, she has over 30 medications and has had several changes recently. Last visit, her [name] was decreased to the 141mcg says that her breathing was better on the 215mcg Patient had a chest x-ray last visit that showed no acute changes She underwent a CT sinus due to persistent cough , which was positive for sinusitis on the right She was treated with a ten-day course of Avelox, she has one dose left Does feel some better but still has cough . She denies any hemoptysis, orthopnea, PND or leg swelling  Patient has upcoming sleep study. Next week for suspected underlying sleep apnea with snoring and daytime sleepiness rec Refer to ENT for sinusitis and chronic cough > never saw one> f/u sinus CT neg 07/26/14  Increase Dulera 200 2 puffs Twice daily   Follow med calendar closely  and bring to each visit.     08/01/2014 f/u ov/Erica [name] re:  Asthma plus noct "gasping"  Chief Complaint  Patient presents with  . Follow-up    doing well.  no complaints.  not doing well x one year with noct / early  4am with cough/ gasping for air almost always goes to neb and then can't go back to sleep even 30 min later better Daytime in chest tightness resolves  completely while on prednisone last took one month prior to OV  And doesn't have it now but has exact same noct symptoms and overusing saba  Doe x mailbox and back to house out of breath and coughing  >>change dulera 200 to 100   08/30/2014 [name] Follow up and Med review : Asthma Patient presents for a one-month follow-up We reviewed all her medications organize them into a medication count with patient education Patient has had  multiple medication changes recently She has been having difficulty with resistant hypertension and visual changes. Recently started on primidone and spironolactone . However, has not started this as of yet That she needs to go pick this up at the pharmacy. Neuro notes reviewed , has been referred to NS . B/p is elevated again at todays visit, discussed follow up with PCP for b/p management.  She tried to decrease her Dulera as recommended from last ov with Dr. Melvyn Novas    however, was unable to and is back on 200 dose .  Says overall her breathing is doing ok  No flare of cough or wheezing  Does have a lot of drainage in throat . Discussed starting on chlortrimeton.  rec Continue Dulera 200 2 puffs Twice daily  , rinse after use  Follow med calendar closely and bring to each visit.  Follow up with Primary MD concerning elevated blood pressure    10/05/2014 f/u ov/Erica [name] re: asthma with component of uacs/ no med calendar  Chief Complaint  Patient presents with  . Follow-up    Here to discuss meds- Dulera not on her drug formulary. Pt states breathing is doing well and denies any new co's today.  turns out using neb at least once every 24 h, esp between 2 and 4 am when wakes up with severe dry cough x months/ pred helps some but never eliminated noct cough rec symbicort 160 Take 2 puffs first thing in am and then another 2 puffs about 12 hours later.  Try taking the tramadol around 2 am to see if helps with the early am cough/ wheeze  See calendar for specific medication instructions and bring it back for each and every office visit  Separating the top medications from the bottom group is fundamental to providing you adequate care going forward.   Please schedule a follow up office visit in 2 weeks, sooner if needed with all medications in 2 separate bags    10/19/2014 f/u ov/Erica [name] re:  Asthma vs uacs/ not using med calendar correclty  Chief Complaint  Patient presents with  . Follow-up     Cough has changed sicnce starting symbicort- prod cough with "dirty" sputum. She states she feels that she can tell when her PM dose is due b/c she starts feeling SOB. She is using proair once per day on average.   not using symbicort 160 correctly  rec Prednisone 10 mg take  4 each am x 2 days,  2 each am x 2 days,  1 each am x 2 days and stop  Bystolic  10 mg take twice daily until return  Take 2 chlortrimeton at bedtime and 2 more 4 hours later  Continue symbicort 160 Take 2 puffs first thing in am and then another 2 puffs about 12 hours later.  Only use your albuterol as rescue  Only use nebulizer if you try proventil first and it fails to work    11/08/2014 Follow up : Asthma /UAC   Pt returns for follow up .  Last ov with asthma flare , tx with pred taper  She is feeling better Breathing is doing better and back to her baseline  Still has dry cough but some better.  We reviewed all her meds and organized them into a med calendar with pt education  Appears to be taking correctly although she is on over 30 medications .  B/p is improved on current regimen.  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 Follow-up    C/o increase SOB, prod cough (tan color phlem), wheezing, chest tx, nasal cong  worse x one week but at baseline doe x more than slow walk and neb q day around 4 am as the hfa not helping with noct symptoms no better even on prednisone rec Prednisone 10 mg take  4 each am x 2 days,  2 each am x 2 days,  1 each am x 2 days and stop      03/24/2015 f/u  Pulmonary office visit/ Erica [name]  Asthma vs vcd Chief Complaint  Patient presents with  . Follow-up    Pt c/o increased cough with tan mucus, wheeze and SOB. Pt reports increase in symptoms with weather changes. Pt has used albuterol HFA with some symptom relief. Pt denies f/n/v/d.   2 weeks prior to OV  Noted needing the noct neb again  where this problem had resolved on pred  then 2 d after that bad cough p [employer] shopping and worse since then but still comfortable p saba rx rec Prednisone 10 mg take  4 each am x 2 days,   2 each am x 2 days,  1 each am x 2 days and stop  For cough use mucinex dm up to 1200 mg every 12 hours and the flutter valve as much as possible and if still can't control the cough > tramadol as per action plan on med calendar    06/29/2015  f/u ov/Erica [name] re: asthma and unrelated cough / no med calendar  Chief Complaint  Patient presents with  . Follow-up    Cough has not improved since she was last seen.  She is using albuterol inhaler 2 x daily and neb with albuterol 1 x daily on average.   MCT did not reproduce the cough but the sob and wheeze  Unless w/in two weeks of taking prednisone, resumes 4 am wheezing/ gasping for air> almost every night needs to back up proair with neb  On dex daily taken before supper  avg use of tramadol is 6 x in 24 h unless on pred  Prednisone 10 mg take  4 each am x 2 days,   2 each am x 2 days,  1 each am x 2 days and stop  Qvar 80 Take 2 puffs first thing in am and then another 2 puffs about 12 hours later.  change dexilant Take 30-60 min before first meal of the day and Add Pepcid 20 mg  at bedtime  For drainage / throat tickle try take CHLORPHENIRAMINE  4 mg - take one every 4 hours as needed - available over the counter- may cause drowsiness so start with just a bedtime dose or two and see how you tolerate it before trying in daytime   See Erica [name] w/in 2 weeks(or next available after than)  with all your medications,  At f/u (given one sample = 120 pffs)     07/18/2015 [name]  Follow up : Asthma vs Cough  Pt returns for 2 week follow up . Seen last ov with Asthma flare , tx w/ steroid taper.  She is feeling better. Still has some lingering cough on/off.  QVAR was added to her symbicort .  We reviewed all her meds and organized them into a med calendar with pt  education.  Appears to be taking correctly rec Follow med calendar    08/31/2015 acute extended ov/Erica [name] re: asthma flare on maint rx with symb 160/qvar 80 2bid Chief Complaint  Patient presents with  . Acute Visit    Pt c/o wheezing, increased SOB, chest tightness and cough with white sputum x 2 days.   baseline still needed albuterol with activity regardless of time of day  Before was using neb nightly but that improved p last ov then p  cleaning out storage shed >  Tongue felt full /sob wheezing > 2 ER Trips 08/19/15  > benadryl/ pepcid no pred >  08/29/15 returned to ER > mgs02/ prednisone at 40 mg per day/ only used saba hfa today / cough less of a problem with this flare   No obvious  Patterns(other than one time shed exp)  in day to day or daytime variability or assoc excess/ purulent sputum or mucus plugs or hemoptysis or cp or chest tightness,  or overt sinus or hb symptoms. No unusual exp hx or h/o childhood pna/ asthma or knowledge of premature birth.  Sleeping ok without nocturnal  or early am exacerbation  of respiratory  c/o's or need for noct saba. Also denies any obvious fluctuation of symptoms with weather or environmental changes or other aggravating or alleviating factors except as outlined above   Current Medications, Allergies, Complete Past Medical History, Past Surgical History, Family History, and Social History were reviewed in [employer] record.  ROS  The following are not active complaints unless bolded sore throat, dysphagia, dental problems, itching, sneezing,  nasal congestion or excess/ purulent secretions, ear ache,   fever, chills, sweats, unintended wt loss, classically pleuritic or exertional cp,  orthopnea pnd or leg swelling, presyncope, palpitations, abdominal pain, anorexia, nausea, vomiting, diarrhea  or change in bowel or bladder habits, change in stools or urine, dysuria,hematuria,  rash, arthralgias, visual complaints, headache,  numbness, weakness or ataxia or problems with walking or coordination,  change in mood/affect or memory.                   Objective:   Physical Exam  Obese bf nad  vital signs reviewed / prominent pseudowheeze/ hoarseness     Wt 213  12/18/10 > 01/03/2011  214 > 01/31/2011  206 >  09/11/2012 209 >208 6/19 > 210 09/25/2012 > 11/20/2012 211 >208 12/07/12> 01/12/2013 209 > 02/09/2013 210 >207 04/06/2013 > 07/14/2013  213>212 07/28/2013 >204 01/26/2014> 04/04/2014 206  >205 04/18/2014 >  07/18/2014 201 >  08/01/14 201 >198 08/30/2014 > 10/05/14  195 > 10/19/2014 192 > 191 11/08/2014 > 02/10/2015  198 >  03/24/2015   198 > 06/29/2015   201 > 08/31/2015  204   HEENT: nl dentition, turbinates, and orophanx. Nl external ear canals without cough reflex,     NECK :  without JVD/Nodes/TM/ nl carotid upstrokes bilaterally   LUNGS: lungs clear bilaterally with good air movement  CV:  RRR  no s3 or murmur or increase in P2, no edema   ABD:  soft and nontender with nl excursion in the supine position. No bruits or organomegaly, bowel sounds nl  MS:  warm without deformities, calf tenderness, cyanosis or clubbing        I personally reviewed images and agree with radiology impression as follows:  CXR:  08/29/15 No evidence of acute cardiopulmonary disease.   Labs ordered, not done: bnp

## 2015-08-31 NOTE — Patient Instructions (Signed)
Goserelin injection What is this medicine? GOSERELIN (GOE se rel in) is similar to a hormone found in the body. It lowers the amount of sex hormones that the body makes. Men will have lower testosterone levels and women will have lower estrogen levels while taking this medicine. In men, this medicine is used to treat prostate cancer; the injection is either given once per month or once every 12 weeks. A once per month injection (only) is used to treat women with endometriosis, dysfunctional uterine bleeding, or advanced breast cancer. This medicine may be used for other purposes; ask your health care provider or pharmacist if you have questions. What should I tell my health care provider before I take this medicine? They need to know if you have any of these conditions (some only apply to women): -diabetes -heart disease or previous heart attack -high blood pressure -high cholesterol -kidney disease -osteoporosis or low bone density -problems passing urine -spinal cord injury -stroke -tobacco smoker -an unusual or allergic reaction to goserelin, hormone therapy, other medicines, foods, dyes, or preservatives -pregnant or trying to get pregnant -breast-feeding How should I use this medicine? This medicine is for injection under the skin. It is given by a health care professional in a hospital or clinic setting. Men receive this injection once every 4 weeks or once every 12 weeks. Women will only receive the once every 4 weeks injection. Talk to your pediatrician regarding the use of this medicine in children. Special care may be needed. Overdosage: If you think you have taken too much of this medicine contact a poison control center or emergency room at once. NOTE: This medicine is only for you. Do not share this medicine with others. What if I miss a dose? It is important not to miss your dose. Call your doctor or health care professional if you are unable to keep an appointment. What may  interact with this medicine? -female hormones like estrogen -herbal or dietary supplements like black cohosh, chasteberry, or DHEA -female hormones like testosterone -prasterone This list may not describe all possible interactions. Give your health care provider a list of all the medicines, herbs, non-prescription drugs, or dietary supplements you use. Also tell them if you smoke, drink alcohol, or use illegal drugs. Some items may interact with your medicine. What should I watch for while using this medicine? Visit your doctor or health care professional for regular checks on your progress. Your symptoms may appear to get worse during the first weeks of this therapy. Tell your doctor or healthcare professional if your symptoms do not start to get better or if they get worse after this time. Your bones may get weaker if you take this medicine for a long time. If you smoke or frequently drink alcohol you may increase your risk of bone loss. A family history of osteoporosis, chronic use of drugs for seizures (convulsions), or corticosteroids can also increase your risk of bone loss. Talk to your doctor about how to keep your bones strong. This medicine should stop regular monthly menstration in women. Tell your doctor if you continue to menstrate. Women should not become pregnant while taking this medicine or for 12 weeks after stopping this medicine. Women should inform their doctor if they wish to become pregnant or think they might be pregnant. There is a potential for serious side effects to an unborn child. Talk to your health care professional or pharmacist for more information. Do not breast-feed an infant while taking this medicine. Men should   inform their doctors if they wish to father a child. This medicine may lower sperm counts. Talk to your health care professional or pharmacist for more information. What side effects may I notice from receiving this medicine? Side effects that you should  report to your doctor or health care professional as soon as possible: -allergic reactions like skin rash, itching or hives, swelling of the face, lips, or tongue -bone pain -breathing problems -changes in vision -chest pain -feeling faint or lightheaded, falls -fever, chills -pain, swelling, warmth in the leg -pain, tingling, numbness in the hands or feet -signs and symptoms of low blood pressure like dizziness; feeling faint or lightheaded, falls; unusually weak or tired -stomach pain -swelling of the ankles, feet, hands -trouble passing urine or change in the amount of urine -unusually high or low blood pressure -unusually weak or tired Side effects that usually do not require medical attention (report to your doctor or health care professional if they continue or are bothersome): -change in sex drive or performance -changes in breast size in both males and females -changes in emotions or moods -headache -hot flashes -irritation at site where injected -loss of appetite -skin problems like acne, dry skin -vaginal dryness This list may not describe all possible side effects. Call your doctor for medical advice about side effects. You may report side effects to FDA at 1-800-FDA-1088. Where should I keep my medicine? This drug is given in a hospital or clinic and will not be stored at home. NOTE: This sheet is a summary. It may not cover all possible information. If you have questions about this medicine, talk to your doctor, pharmacist, or health care provider.    2016, Elsevier/Gold Standard. (2013-05-25 11:10:35)  

## 2015-08-31 NOTE — Patient Instructions (Signed)
Medication Instructions:  Your physician recommends that you continue on your current medications as directed. Please refer to the Current Medication list given to you today.   Labwork: Your physician recommends that you have lab work today: bnp   Testing/Procedures: Your physician has requested that you have an echocardiogram. Echocardiography is a painless test that uses sound waves to create images of your heart. It provides your doctor with information about the size and shape of your heart and how well your heart's chambers and valves are working. This procedure takes approximately one hour. There are no restrictions for this procedure.    Follow-Up: Your physician recommends that you keep your scheduled  follow-up appointment with Dr. Meda Coffee   Any Other Special Instructions Will Be Listed Below (If Applicable).  If the shortness of breath does not get better in 2-3 days please follow up with pulmonary  or Primary care   If you need a refill on your cardiac medications before your next appointment, please call your pharmacy.

## 2015-09-03 NOTE — Assessment & Plan Note (Signed)
Complicated by reflux/ hpb/ restrictive pfts  Body mass index is 32.88  Lab Results  Component Value Date   TSH 1.06 06/15/2015     Contributing to gerd tendency/ doe/reviewed the need and the process to achieve and maintain neg calorie balance > defer f/u primary care including intermittently monitoring thyroid status

## 2015-09-03 NOTE — Assessment & Plan Note (Signed)
MRI 05/18/15 > Paranasal sinuses are clear  Of the three most common causes of chronic cough, only one (GERD)  can actually cause the other two (asthma and post nasal drip syndrome)  and perpetuate the cylce of cough inducing airway trauma, inflammation, heightened sensitivity to reflux which is prompted by the cough itself via a cyclical mechanism.    This may partially respond to steroids and look like asthma and post nasal drainage but never erradicated completely unless the cough and the secondary reflux are eliminated, preferably both at the same time.  While not intuitively obvious, many patients with chronic low grade reflux do not cough until there is a secondary insult that disturbs the protective epithelial barrier and exposes sensitive nerve endings.  This can be viral or direct physical injury such as with an endotracheal tube.   The point is that once this occurs, it is difficult to eliminate using anything but a maximally effective acid suppression regimen at least in the short run, accompanied by an appropriate diet to address non acid GERD.   rec max gerd diet/ acid suppression then regroup in 4 weeks with all meds and med calendar

## 2015-09-03 NOTE — Assessment & Plan Note (Signed)
- PFT's 01/31/2011 FEV1  1.76 (60% ) ratio 68 and no better p B2,  DLCO 70% -med review 09/25/2012 > Dulera drug assistance  paperwork given    - HFA 90% 11/20/2012 .  -Med calendar 12/07/12 , 04/06/2013 , 04/18/2014,, 08/30/2014 , 11/08/2014  - PFT's 01/12/2013  1.73 (65%)  Ratio 73 and no better p B2, DLCO 86% - trial off singulair 02/08/13 >>>did not stop  - 04/05/2014  trial of dulera 100 instead of 200 > worse  -04/18/2014 increase Dulera 200  - 08/01/2014 p extensive coaching HFA effectiveness =    90% - 08/01/2014  Walked RA x 2laps @ 185 ft each stopped due to cough > sob/ no desat moderate pace  - spirometry 08/01/2014  FEV1  1.87 (70%) ratio 78  fef 25-75 53%  - 02/10/2015  Walked RA x 3 laps @ 185 ft each stopped due to   - spirometry 02/10/2015  flattening of top portion of f/v loop only  - 02/10/2015  extensive coaching HFA effectiveness =    90%  - 02/10/2015   Walked RA  2 laps @ 185 ft each stopped due to cough > sob/ slow pace   -  NO 03/24/2015   =  16 - Methacholine challenge 04/04/2015   POS asthma = wheeze/sob but no coughing fits > added qvar 80 2bid 06/29/2015 >>>   -flare sarting Aug 19 2015 p exp to storage shed  - 08/31/2015  After extensive coaching HFA effectiveness =    75% (Ti too short)  - Spirometry 08/31/2015  FEV1 0.75 (28%)  Ratio 68/ could not do FEN  Symptoms are markedly disproportionate to objective findings and not clear this is a lung problem but pt does appear to have difficult airway management issues. DDX of  difficult airways management almost all start with A and  include Adherence, Ace Inhibitors, Acid Reflux, Active Sinus Disease, Alpha 1 Antitripsin deficiency, Anxiety masquerading as Airways dz,  ABPA,  Allergy(esp in young), Aspiration (esp in elderly), Adverse effects of meds,  Active smokers, A bunch of PE's (a small clot burden can't cause this syndrome unless there is already severe underlying pulm or vascular dz with poor reserve) plus two Bs  = Bronchiectasis and  Beta blocker use..and one C= CHF  Adherence is always the initial "prime suspect" and is a multilayered concern that requires a "trust but verify" approach in every patient - starting with knowing how to use medications, especially inhalers, correctly, keeping up with refills and understanding the fundamental difference between maintenance and prns vs those medications only taken for a very short course and then stopped and not refilled.  - still not able to master hfa  But better vs baseline so continue - need a trust but verify approach here counting digits on inhalers because she says pred helps and is on 2 ICS now   ? Acid (or non-acid) GERD > always difficult to exclude as up to 75% of pts in some series report no assoc GI/ Heartburn symptoms> rec continue max (24h)  acid suppression and diet restrictions/ reviewed     ? Allergy > taper off pred as tol/ no change maint rx  I had an extended discussion with the patient reviewing all relevant studies completed to date and  lasting 15 to 20 minutes of a 25 minute visit    Each maintenance medication was reviewed in detail including most importantly the difference between maintenance and prns and under what circumstances the  prns are to be triggered using an action plan format that is not reflected in the computer generated alphabetically organized AVS but trather by a customized med calendar that reflects the AVS meds with confirmed 100% correlation.   Please see instructions for details which were reviewed in writing and the patient given a copy highlighting the part that I personally wrote and discussed at today's ov.

## 2015-09-08 ENCOUNTER — Other Ambulatory Visit: Payer: Self-pay | Admitting: Neurology

## 2015-09-08 ENCOUNTER — Encounter: Payer: Self-pay | Admitting: Internal Medicine

## 2015-09-08 ENCOUNTER — Other Ambulatory Visit: Payer: Self-pay | Admitting: Cardiology

## 2015-09-08 NOTE — Telephone Encounter (Signed)
Rx sent 

## 2015-09-11 ENCOUNTER — Other Ambulatory Visit: Payer: Self-pay | Admitting: Internal Medicine

## 2015-09-11 MED ORDER — EPINEPHRINE 0.3 MG/0.3ML IJ SOAJ: 0.3000 mg | Freq: Once | INTRAMUSCULAR | Status: DC | PRN

## 2015-09-18 ENCOUNTER — Other Ambulatory Visit: Payer: Self-pay

## 2015-09-18 ENCOUNTER — Ambulatory Visit (HOSPITAL_COMMUNITY): Payer: Medicare Other | Attending: Internal Medicine

## 2015-09-18 DIAGNOSIS — E669 Obesity, unspecified: Secondary | ICD-10-CM | POA: Diagnosis not present

## 2015-09-18 DIAGNOSIS — G4733 Obstructive sleep apnea (adult) (pediatric): Secondary | ICD-10-CM | POA: Diagnosis not present

## 2015-09-18 DIAGNOSIS — R0602 Shortness of breath: Secondary | ICD-10-CM | POA: Diagnosis not present

## 2015-09-18 DIAGNOSIS — Z6832 Body mass index (BMI) 32.0-32.9, adult: Secondary | ICD-10-CM | POA: Diagnosis not present

## 2015-09-18 DIAGNOSIS — Z8249 Family history of ischemic heart disease and other diseases of the circulatory system: Secondary | ICD-10-CM | POA: Insufficient documentation

## 2015-09-18 DIAGNOSIS — I119 Hypertensive heart disease without heart failure: Secondary | ICD-10-CM | POA: Diagnosis not present

## 2015-09-26 ENCOUNTER — Ambulatory Visit (INDEPENDENT_AMBULATORY_CARE_PROVIDER_SITE_OTHER): Payer: Medicare Other | Admitting: Cardiology

## 2015-09-26 ENCOUNTER — Encounter: Payer: Self-pay | Admitting: Cardiology

## 2015-09-26 VITALS — BP 188/115 | HR 113 | Ht 66.0 in | Wt 191.4 lb

## 2015-09-26 DIAGNOSIS — G47419 Narcolepsy without cataplexy: Secondary | ICD-10-CM

## 2015-09-26 DIAGNOSIS — Z9989 Dependence on other enabling machines and devices: Secondary | ICD-10-CM

## 2015-09-26 DIAGNOSIS — R0602 Shortness of breath: Secondary | ICD-10-CM | POA: Diagnosis not present

## 2015-09-26 DIAGNOSIS — I119 Hypertensive heart disease without heart failure: Secondary | ICD-10-CM

## 2015-09-26 DIAGNOSIS — R000 Tachycardia, unspecified: Secondary | ICD-10-CM

## 2015-09-26 DIAGNOSIS — I110 Hypertensive heart disease with heart failure: Secondary | ICD-10-CM

## 2015-09-26 DIAGNOSIS — R0600 Dyspnea, unspecified: Secondary | ICD-10-CM

## 2015-09-26 DIAGNOSIS — G4733 Obstructive sleep apnea (adult) (pediatric): Secondary | ICD-10-CM

## 2015-09-26 DIAGNOSIS — I10 Essential (primary) hypertension: Secondary | ICD-10-CM

## 2015-09-26 DIAGNOSIS — R002 Palpitations: Secondary | ICD-10-CM

## 2015-09-26 DIAGNOSIS — R61 Generalized hyperhidrosis: Secondary | ICD-10-CM

## 2015-09-26 DIAGNOSIS — R0989 Other specified symptoms and signs involving the circulatory and respiratory systems: Secondary | ICD-10-CM

## 2015-09-26 HISTORY — DX: Hypertensive heart disease without heart failure: I11.9

## 2015-09-26 MED ORDER — FUROSEMIDE 20 MG PO TABS: 20.0000 mg | ORAL_TABLET | Freq: Every day | ORAL | Status: DC

## 2015-09-26 MED ORDER — SPIRONOLACTONE 25 MG PO TABS: 25.0000 mg | ORAL_TABLET | Freq: Every day | ORAL | Status: DC

## 2015-09-26 NOTE — Patient Instructions (Signed)
Medication Instructions:   STOP TAKING MAXIDE NOW  START TAKING LASIX 20 MG ONCE DAILY  START TAKING SPIRONOLACTONE 25 MG ONCE DAILY    Labwork:  PLEASE REPORT DOWNSTAIRS TO [facility] AND PRESENT THE 2 ORDERS WRITTEN FOR YOU TO HAVE A URINE CATECHOLAMINES AND URINE 5 HIAA DONE--THESE ARE BOTH 24 HOUR URINES     Testing/Procedures:  Your physician has requested that you have a renal artery duplex. During this test, an ultrasound is used to evaluate blood flow to the kidneys. Allow one hour for this exam. Do not eat after midnight the day before and avoid carbonated beverages. Take your medications as you usually do.    Follow-Up:  AT [facility] NEXT AVAILABLE IN September       If you need a refill on your cardiac medications before your next appointment, please call your pharmacy.

## 2015-09-26 NOTE — Progress Notes (Signed)
Patient ID: Erica [name], female   DOB: [birth year], 40 y.o.   MRN: [id number]    Patient Name: Erica [name] Date of Encounter: 09/26/2015  Primary Care Provider:  Scarlette Calico, MD Primary Cardiologist:  Ena Dawley  Patient Profile  Chest pain  Problem List   Past Medical History  Diagnosis Date  . Asthma   . Hypertension   . GERD (gastroesophageal reflux disease)   . Impaired glucose tolerance 04/13/2011  . Anemia, unspecified 04/13/2011  . Asthma 09/27/2010  . Bloating 03/18/2011  . 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 Lymphadenitis, chronic     restricted LEFT extremity  . Dyspnea on exertion     a. 01/2013 Lexi MV: EF 54%, no ischemia/infarct;  b. 05/2013 Echo: EF 60-65%, no rwma, Gr 2 DD;  b.   . OSA (obstructive sleep apnea) 05/04/2014    AHI 31/hr now on CPAP at 12cm H2O  . Breast cancer ([place]) 2010    a. locally advanced left breast carcinoma diagnosed in 2010 and treated with neoadjuvant chemotherapy with docetaxel and cyclophosphamide as well as paclitaxel. This was followed by radiation therapy which was completed in 2011.  . Fatty liver   . Gastroparesis    Past Surgical History  Procedure Laterality Date  . Tendon release Right 2012    "hand"  . Breast lumpectomy Left 09/2008  . Carpal tunnel release Left 2011  . Tubal ligation  05/11/2011    Procedure: POST PARTUM TUBAL LIGATION;  Surgeon: Emeterio Reeve, MD;  Location: [facility];  Service: Gynecology;  Laterality: Bilateral;  Induced for HTN  . Lymph node dissection Left 2010    breast; 2 wks after breast lumpectomy   Allergies  Allergies  Allergen Reactions  . Aspirin Shortness Of Breath and Palpitations    Pt can take ibuprofen without reaction  . Doxycycline Anaphylaxis  . Penicillins Shortness Of Breath and Palpitations    Has patient had a PCN reaction causing immediate rash, facial/tongue/throat swelling, SOB or lightheadedness with hypotension: Yes Has patient had a PCN reaction causing severe  rash involving mucus membranes or skin necrosis: Yes Has patient had a PCN reaction that required hospitalization Yes Has patient had a PCN reaction occurring within the last 10 years: No If all of the above answers are "NO", then may proceed with Cephalosporin use.  Severiano Gilbert Extract Itching and Swelling    Lip swelling  . Strawberry Extract Hives   Chief complain: recurrent syncope  HPI  40 year old female with h/o asthma, obesity, hypertension, who is coming with complaints of chest pain. This pleasant young female has a history of locally advanced left breast carcinoma diagnosed in 2010 and treated with neoadjuvant chemotherapy with docetaxel and cyclophosphamide as well as paclitaxel. This was followed by radiation therapy which was completed in 2011. The patient complains of chest pain it is not exertional, it is locally located in the retrosternal and left epigastric region. It has been happening anytime of the day, and can last for minutes. There is no clear aggravating or alleviating factor. The patient states that ever since her chemotherapy she gets short of breath with minimal activity and states that she will be able to walk a block.  09/27/2013 - the patient is coming complaining of continuous daily wheezing, and nonexertional right upper quadrant pain that force her to go to the ER. A complete workup including abdominal ultrasound and CAT scan was performed. The only finding was fatty liver no acute  pathology in the gallbladder no gallstones. The patient had an upper EGD last year where her gastroparesis was diagnosed and she was started on metoclopramide and proton next. Her symptoms improved however she continues having episodes of this pain. She feels overall very tired and short of breath and complaints of orthopnea.  11/30/2013 - we stopped Bystolic at the last visit as it caused wheezing, her wheezing has improved but she feels more SOB. She is trying to exercise but gets SOB  easily.   03/14/2014 -  The patient is undergoing cardiac rehabilitation and has experienced at least 2 episodes of syncope that are not related to exertion.  One of her episodes was while she was wearing Holter monitor and strips only showed sinus rhythm. The patient is being evaluated for sleep apnea in January.She continues going to cardiac rehabilitation and enjoys it very well she overall is feels better.She brings her blood pressure log and her blood pressure  Are in the range [id number] for systolic blood pressure. She also complains of lower extremity edema that happens on certain days.  05/03/2014 - the patient is coming with complaint of headache with Imdur. She still taking it. She wakes up every night with shortness of breath that resolves after she uses albuterol inhaler. She tries to exercise but bending makes her feel more short of breath. She underwent sleep study and is awaiting results.  09/26/2015 - 6 months follow up, she feels dyspneic with minimal exertion, she is compliant with her meds and hypertensive at 140-150 mmHg.  She is also complaining of palpitations. No syncope. She has been experiencing diaphoresis and flushing approximately twice a day. No LE edema, but positive PND.   Home Medications   Current outpatient prescriptions:  .  acetaZOLAMIDE (DIAMOX) 250 MG tablet, Take 4 tablets (1,000 mg total) by mouth 3 (three) times daily., Disp: 400 tablet, Rfl: 11 .  albuterol (PROVENTIL HFA;VENTOLIN HFA) 108 (90 Base) MCG/ACT inhaler, Inhale 2 puffs into the lungs every 4 (four) hours as needed for wheezing or shortness of breath., Disp: , Rfl:  .  albuterol (PROVENTIL) (2.5 MG/3ML) 0.083% nebulizer solution, USE 1 VIAL VIA NEBULIZER EVERY 4 HOURS AS NEEDED( NEED APPOINTMENT), Disp: 300 mL, Rfl: 2 .  amLODipine (NORVASC) 10 MG tablet, Take 1 tablet by mouth every morning., Disp: , Rfl: 3 .  beclomethasone (QVAR) 80 MCG/ACT inhaler, Inhale 2 puffs into the lungs 2 (two) times  daily., Disp: 1 Inhaler, Rfl: 5 .  Biotin 10 MG TABS, Take 1 tablet by mouth every morning. , Disp: , Rfl:  .  BYSTOLIC 20 MG TABS, Take 20 mg by mouth daily., Disp: , Rfl: 3 .  chlorpheniramine (CHLOR-TRIMETON) 4 MG tablet, Take 8 mg by mouth at bedtime., Disp: , Rfl:  .  cloNIDine (CATAPRES) 0.3 MG tablet, TAKE 1 TABLET BY MOUTH TWICE DAILY, Disp: 180 tablet, Rfl: 3 .  dexlansoprazole (DEXILANT) 60 MG capsule, Take 60 mg by mouth daily before breakfast. , Disp: , Rfl:  .  dextromethorphan-guaiFENesin (MUCINEX DM) 30-600 MG per 12 hr tablet, Take 1 tablet by mouth 2 (two) times daily. , Disp: , Rfl:  .  diltiazem (CARDIZEM LA) 420 MG 24 hr tablet, Take 1 tablet (420 mg total) by mouth daily., Disp: 90 tablet, Rfl: 3 .  DULoxetine (CYMBALTA) 30 MG capsule, Take 1 capsule (30 mg total) by mouth daily., Disp: 90 capsule, Rfl: 1 .  EPINEPHrine 0.3 mg/0.3 mL IJ SOAJ injection, Inject 0.3 mLs (0.3 mg total) into  the muscle once as needed (anaphylaxis reaction)., Disp: 2 Device, Rfl: 5 .  fluocinonide-emollient (LIDEX-E) 0.05 % cream, Apply 1 application topically 2 (two) times daily., Disp: 60 g, Rfl: 1 .  hydrALAZINE (APRESOLINE) 100 MG tablet, Take 1 tablet (100 mg total) by mouth 3 (three) times daily., Disp: 90 tablet, Rfl: 11 .  isosorbide mononitrate (IMDUR) 60 MG 24 hr tablet, Take 60 mg by mouth every morning., Disp: , Rfl:  .  LORazepam (ATIVAN) 0.5 MG tablet, TAKE ONE TABLET BY MOUTH EVERY 8 HOURS AS NEEDED FOR ANXIETY, Disp: 90 tablet, Rfl: 0 .  LYRICA 200 MG capsule, TAKE ONE CAPSULE BY MOUTH THREE TIMES DAILY, Disp: 90 capsule, Rfl: 0 .  metoCLOPramide (REGLAN) 10 MG tablet, Take 10 mg by mouth 4 (four) times daily., Disp: , Rfl:  .  mometasone (NASONEX) 50 MCG/ACT nasal spray, Place 2 sprays into the nose 2 (two) times daily., Disp: 17 g, Rfl: 5 .  montelukast (SINGULAIR) 10 MG tablet, TAKE 1 TABLET BY MOUTH EVERY NIGHT AT BEDTIME, Disp: 90 tablet, Rfl: 1 .  nitroGLYCERIN (NITROSTAT) 0.4  MG SL tablet, Place 0.4 mg under the tongue every 5 (five) minutes as needed for chest pain (x 3 doses)., Disp: , Rfl:  .  ondansetron (ZOFRAN-ODT) 4 MG disintegrating tablet, Take 1 tablet (4 mg total) by mouth every 8 (eight) hours as needed for nausea or vomiting., Disp: 20 tablet, Rfl: 0 .  OXYGEN, Use CPAP at bedtime, Disp: , Rfl:  .  potassium chloride SA (K-DUR,KLOR-CON) 20 MEQ tablet, Take 20 mEq by mouth 3 (three) times daily., Disp: , Rfl:  .  primidone (MYSOLINE) 50 MG tablet, Take 25 mg by mouth every morning., Disp: , Rfl:  .  Respiratory Therapy Supplies (FLUTTER) DEVI, Use as directed, Disp: 1 each, Rfl: 0 .  SYMBICORT 160-4.5 MCG/ACT inhaler, INHALE 2 PUFFS BY MOUTH TWICE DAILY, Disp: 10.2 g, Rfl: 5 .  topiramate (TOPAMAX) 25 MG tablet, Take 25 mg by mouth 2 (two) times daily., Disp: , Rfl:  .  traMADol (ULTRAM) 50 MG tablet, Up to 2 every 4 hours if needed for coughing, Disp: 180 tablet, Rfl: 0 .  traZODone (DESYREL) 150 MG tablet, Take 1 tablet (150 mg total) by mouth at bedtime., Disp: 90 tablet, Rfl: 1 .  furosemide (LASIX) 20 MG tablet, Take 1 tablet (20 mg total) by mouth daily., Disp: 90 tablet, Rfl: 3 .  spironolactone (ALDACTONE) 25 MG tablet, Take 1 tablet (25 mg total) by mouth daily., Disp: 90 tablet, Rfl: 3 No current facility-administered medications for this visit.  Facility-Administered Medications Ordered in Other Visits:  .  goserelin (ZOLADEX) injection 3.6 mg, 3.6 mg, Subcutaneous, Once, Chauncey Cruel, MD   Family History  Family History  Problem Relation Age of Onset  . Diabetes Maternal Grandmother   . Heart disease Maternal Grandmother   . Hypertension Maternal Grandmother   . Cancer Neg Hx   . Alcohol abuse Neg Hx   . Early death Neg Hx   . Hyperlipidemia Neg Hx   . Kidney disease Neg Hx   . Stroke Neg Hx   . Colon cancer Neg Hx     Social History  Social History   Social History  . Marital Status: Single    Spouse Name: N/A  .  Number of Children: 3  . Years of Education: N/A   Occupational History  .     Social History Main Topics  . Smoking status: Never Smoker   .  Smokeless tobacco: Never Used  . Alcohol Use: 0.0 oz/week    0 Standard drinks or equivalent per week     Comment: occasionally  . Drug Use: No  . Sexual Activity: Yes    Birth Control/ Protection: Surgical   Other Topics Concern  . Not on file   Social History Narrative   She lives with four children.   She is currently not working (disbaility pending).           Review of Systems General:  No chills, fever, night sweats or weight changes.  Cardiovascular:  No chest pain, dyspnea on exertion, edema, orthopnea, palpitations, paroxysmal nocturnal dyspnea. Dermatological: No rash, lesions/masses Respiratory: No cough, dyspnea Urologic: No hematuria, dysuria Abdominal:   No nausea, vomiting, diarrhea, bright red blood per rectum, melena, or hematemesis Neurologic:  No visual changes, wkns, changes in mental status. All other systems reviewed and are otherwise negative except as noted above.  Physical Exam Blood pressure 188/115, pulse 113, height 5\' 6"  (1.676 m), weight 191 lb 6.4 oz (86.818 kg).  General: Pleasant, NAD Psych: Normal affect. Neuro: Alert and oriented X 3. Moves all extremities spontaneously. HEENT: Normal  Neck: Supple without bruits or JVD. Lungs:  Resp regular and unlabored, CTA. Heart: RRR no s3, s4, or murmurs. Abdomen: Soft, non-tender, non-distended, BS + x 4.  Extremities: No clubbing, cyanosis or edema. DP/PT/Radials 2+ and equal bilaterally.  Lipid Panel     Component Value Date/Time   CHOL 206* 06/15/2015 1645   TRIG 110.0 06/15/2015 1645   HDL 44.10 06/15/2015 1645   CHOLHDL 5 06/15/2015 1645   VLDL 22.0 06/15/2015 1645   LDLCALC 140* 06/15/2015 1645   Nuclear stress test: 02/15/2013 Impression  Exercise Capacity: Lexiscan with no exercise.  BP Response: Hypertensive blood pressure response.    Clinical Symptoms: Chest pain, headache.  ECG Impression: No significant ST segment change suggestive of ischemia.  Comparison with Prior Nuclear Study: No images to compare  Overall Impression: Normal stress nuclear study.  LV Ejection Fraction: 54%. LV Wall Motion: NL LV Function; NL Wall Motion  Loralie Champagne  02/12/2013   TTE 06/17/2013  Left ventricle: The cavity size was normal. There was mild concentric hypertrophy. Systolic function was normal. The estimated ejection fraction was in the range of 60% to 65%. Wall motion was normal; there were no regional wall motion abnormalities. Features are consistent with a pseudonormal left ventricular filling pattern, with concomitant abnormal relaxation and increased filling pressure (grade 2 diastolic dysfunction). Doppler parameters are consistent with high ventricular filling pressure. Impressions: - When compared to 2010, EF has improved.  Accessory Clinical Findings  ECG - normal sinus rhythm, 100 beats per minute, normal EKG.    Assessment & Plan  40 year old female with prior medical history of hyperlipidemia, hypertension and breast carcinoma with chemotherapy that can potentially cause heart block, heart failure and angina.  1.  Hypertensive heart disease with CHF - the patient continues to be significantly hypertensive despite combination of medication including diltiazem 1420 mg daily, hydralazine 100 mg 3 times a day, Imdur 60 mg daily, Bystolic 20 mg daily, clonidine 0.3 mg daily in combination of triamterene and hydrochlorothiazide,  I want to avoid ACE inhibitor or ARB as she had acute renal failure with losartan last year, I'm going to discontinue combination of hydrochlorothiazide and triamterene and start spironolactone 25 mg daily and Lasix 20 mg daily.  I'm also going to proceed with workup for secondary hypertension we will order renal arterial ultrasound  bilaterally to rule out stenosis, we will also order  urine catecholamines and urine 5 HIAA for workup of possible carcinoid syndrome and possible pheochromocytoma. This is supported by history of acute diaphoresis and flushing and also ongoing tachycardia.  2. Acute on chronic diastolic CHF, associated with dyspnea on exertion - A lexiscan nuclear stress test showed poor exercise capacity, LV EF 54% and no perfusion defect.  Echocardiogram showed preserved LV function it is improved compared to prior echo however she has pseudo-normal pattern of diastolic dysfunction with elevated filling pressures.  Her blood pressure management has been number of challenging as her blood pressure is labile and can be very low as well.  Start Lasix and spironolactone described above.  3. History of acute kidney failure now creatinine is to normal.  4. Recurrent syncope -  E-cardio monitor showed only sinus rhythm. This is possibly due to narcolepsy, sleep study showed severe sleep apnea, she is non-complaint with CPAP machine. Strongly advised to call sleep center for her repeat sleep study.  Advised to take it easy after standing up, to avoid orthostatic hypotension. The problem is that she is hypertensive despite several antihypertensives including vasodilators that are most probably contributing to her symptoms. Unable to wean off.   5. OSA - severe, non-complaint with CPAP, as above  6. Lipid profile - LDL 129 just recommend exercise and diet.  7. 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  Ena Dawley, MD 09/26/2015, 2:02 PM

## 2015-09-27 DIAGNOSIS — I110 Hypertensive heart disease with heart failure: Secondary | ICD-10-CM | POA: Diagnosis not present

## 2015-09-28 ENCOUNTER — Ambulatory Visit (HOSPITAL_BASED_OUTPATIENT_CLINIC_OR_DEPARTMENT_OTHER): Payer: Medicare Other

## 2015-09-28 VITALS — BP 180/88 | HR 100 | Temp 98.9°F | Resp 20

## 2015-09-28 DIAGNOSIS — Z5111 Encounter for antineoplastic chemotherapy: Secondary | ICD-10-CM | POA: Diagnosis not present

## 2015-09-28 DIAGNOSIS — C50412 Malignant neoplasm of upper-outer quadrant of left female breast: Secondary | ICD-10-CM

## 2015-09-28 MED ORDER — GOSERELIN ACETATE 3.6 MG ~~LOC~~ IMPL
3.6000 mg | DRUG_IMPLANT | Freq: Once | SUBCUTANEOUS | Status: AC
  Administered 2015-09-28: 3.6 mg via SUBCUTANEOUS
  Filled 2015-09-28: qty 3.6

## 2015-09-28 NOTE — Patient Instructions (Signed)
Goserelin injection What is this medicine? GOSERELIN (GOE se rel in) is similar to a hormone found in the body. It lowers the amount of sex hormones that the body makes. Men will have lower testosterone levels and women will have lower estrogen levels while taking this medicine. In men, this medicine is used to treat prostate cancer; the injection is either given once per month or once every 12 weeks. A once per month injection (only) is used to treat women with endometriosis, dysfunctional uterine bleeding, or advanced breast cancer. This medicine may be used for other purposes; ask your health care provider or pharmacist if you have questions. What should I tell my health care provider before I take this medicine? They need to know if you have any of these conditions (some only apply to women): -diabetes -heart disease or previous heart attack -high blood pressure -high cholesterol -kidney disease -osteoporosis or low bone density -problems passing urine -spinal cord injury -stroke -tobacco smoker -an unusual or allergic reaction to goserelin, hormone therapy, other medicines, foods, dyes, or preservatives -pregnant or trying to get pregnant -breast-feeding How should I use this medicine? This medicine is for injection under the skin. It is given by a health care professional in a hospital or clinic setting. Men receive this injection once every 4 weeks or once every 12 weeks. Women will only receive the once every 4 weeks injection. Talk to your pediatrician regarding the use of this medicine in children. Special care may be needed. Overdosage: If you think you have taken too much of this medicine contact a poison control center or emergency room at once. NOTE: This medicine is only for you. Do not share this medicine with others. What if I miss a dose? It is important not to miss your dose. Call your doctor or health care professional if you are unable to keep an appointment. What may  interact with this medicine? -female hormones like estrogen -herbal or dietary supplements like black cohosh, chasteberry, or DHEA -female hormones like testosterone -prasterone This list may not describe all possible interactions. Give your health care provider a list of all the medicines, herbs, non-prescription drugs, or dietary supplements you use. Also tell them if you smoke, drink alcohol, or use illegal drugs. Some items may interact with your medicine. What should I watch for while using this medicine? Visit your doctor or health care professional for regular checks on your progress. Your symptoms may appear to get worse during the first weeks of this therapy. Tell your doctor or healthcare professional if your symptoms do not start to get better or if they get worse after this time. Your bones may get weaker if you take this medicine for a long time. If you smoke or frequently drink alcohol you may increase your risk of bone loss. A family history of osteoporosis, chronic use of drugs for seizures (convulsions), or corticosteroids can also increase your risk of bone loss. Talk to your doctor about how to keep your bones strong. This medicine should stop regular monthly menstration in women. Tell your doctor if you continue to menstrate. Women should not become pregnant while taking this medicine or for 12 weeks after stopping this medicine. Women should inform their doctor if they wish to become pregnant or think they might be pregnant. There is a potential for serious side effects to an unborn child. Talk to your health care professional or pharmacist for more information. Do not breast-feed an infant while taking this medicine. Men should   inform their doctors if they wish to father a child. This medicine may lower sperm counts. Talk to your health care professional or pharmacist for more information. What side effects may I notice from receiving this medicine? Side effects that you should  report to your doctor or health care professional as soon as possible: -allergic reactions like skin rash, itching or hives, swelling of the face, lips, or tongue -bone pain -breathing problems -changes in vision -chest pain -feeling faint or lightheaded, falls -fever, chills -pain, swelling, warmth in the leg -pain, tingling, numbness in the hands or feet -signs and symptoms of low blood pressure like dizziness; feeling faint or lightheaded, falls; unusually weak or tired -stomach pain -swelling of the ankles, feet, hands -trouble passing urine or change in the amount of urine -unusually high or low blood pressure -unusually weak or tired Side effects that usually do not require medical attention (report to your doctor or health care professional if they continue or are bothersome): -change in sex drive or performance -changes in breast size in both males and females -changes in emotions or moods -headache -hot flashes -irritation at site where injected -loss of appetite -skin problems like acne, dry skin -vaginal dryness This list may not describe all possible side effects. Call your doctor for medical advice about side effects. You may report side effects to FDA at 1-800-FDA-1088. Where should I keep my medicine? This drug is given in a hospital or clinic and will not be stored at home. NOTE: This sheet is a summary. It may not cover all possible information. If you have questions about this medicine, talk to your doctor, pharmacist, or health care provider.    2016, Elsevier/Gold Standard. (2013-05-25 11:10:35)  

## 2015-09-29 ENCOUNTER — Ambulatory Visit: Payer: Medicare Other | Admitting: Internal Medicine

## 2015-09-29 ENCOUNTER — Ambulatory Visit (HOSPITAL_COMMUNITY): Admission: RE | Admit: 2015-09-29 | Payer: Medicare Other | Source: Ambulatory Visit

## 2015-10-03 LAB — 5 HIAA, QUANTITATIVE, URINE, 24 HOUR
5-HIAA, Ur: 1.2 mg/L
5-HIAA,Quant.,24 Hr Urine: 1.6 mg/24 hr (ref 0.0–14.9)

## 2015-10-10 ENCOUNTER — Encounter: Payer: Self-pay | Admitting: Nurse Practitioner

## 2015-10-11 DIAGNOSIS — G932 Benign intracranial hypertension: Secondary | ICD-10-CM | POA: Diagnosis not present

## 2015-10-12 ENCOUNTER — Other Ambulatory Visit: Payer: Self-pay | Admitting: *Deleted

## 2015-10-12 ENCOUNTER — Encounter: Payer: Self-pay | Admitting: Neurology

## 2015-10-12 DIAGNOSIS — R29898 Other symptoms and signs involving the musculoskeletal system: Secondary | ICD-10-CM

## 2015-10-12 DIAGNOSIS — G932 Benign intracranial hypertension: Secondary | ICD-10-CM

## 2015-10-12 DIAGNOSIS — IMO0001 Reserved for inherently not codable concepts without codable children: Secondary | ICD-10-CM

## 2015-10-12 DIAGNOSIS — R531 Weakness: Secondary | ICD-10-CM

## 2015-10-12 DIAGNOSIS — R6889 Other general symptoms and signs: Secondary | ICD-10-CM

## 2015-10-12 DIAGNOSIS — R292 Abnormal reflex: Secondary | ICD-10-CM

## 2015-10-12 DIAGNOSIS — T451X5A Adverse effect of antineoplastic and immunosuppressive drugs, initial encounter: Secondary | ICD-10-CM

## 2015-10-12 DIAGNOSIS — G620 Drug-induced polyneuropathy: Secondary | ICD-10-CM

## 2015-10-12 DIAGNOSIS — H5340 Unspecified visual field defects: Secondary | ICD-10-CM

## 2015-10-13 ENCOUNTER — Ambulatory Visit (HOSPITAL_COMMUNITY)
Admission: RE | Admit: 2015-10-13 | Discharge: 2015-10-13 | Disposition: A | Discharge status: Home / Self Care | Payer: Medicare Other | Source: Ambulatory Visit | Attending: Cardiovascular Disease | Admitting: Cardiovascular Disease

## 2015-10-13 DIAGNOSIS — G4733 Obstructive sleep apnea (adult) (pediatric): Secondary | ICD-10-CM | POA: Diagnosis not present

## 2015-10-13 DIAGNOSIS — I110 Hypertensive heart disease with heart failure: Secondary | ICD-10-CM | POA: Insufficient documentation

## 2015-10-13 DIAGNOSIS — I509 Heart failure, unspecified: Secondary | ICD-10-CM | POA: Diagnosis not present

## 2015-10-13 DIAGNOSIS — K219 Gastro-esophageal reflux disease without esophagitis: Secondary | ICD-10-CM | POA: Insufficient documentation

## 2015-10-13 DIAGNOSIS — I10 Essential (primary) hypertension: Secondary | ICD-10-CM | POA: Diagnosis not present

## 2015-10-19 ENCOUNTER — Ambulatory Visit
Admission: RE | Admit: 2015-10-19 | Discharge: 2015-10-19 | Disposition: A | Discharge status: Home / Self Care | Payer: Medicare Other | Source: Ambulatory Visit | Attending: Neurology | Admitting: Neurology

## 2015-10-19 ENCOUNTER — Other Ambulatory Visit: Payer: Self-pay | Admitting: Neurology

## 2015-10-19 DIAGNOSIS — G620 Drug-induced polyneuropathy: Secondary | ICD-10-CM

## 2015-10-19 DIAGNOSIS — G9009 Other idiopathic peripheral autonomic neuropathy: Secondary | ICD-10-CM | POA: Diagnosis not present

## 2015-10-19 DIAGNOSIS — R6889 Other general symptoms and signs: Secondary | ICD-10-CM

## 2015-10-19 DIAGNOSIS — G932 Benign intracranial hypertension: Secondary | ICD-10-CM | POA: Diagnosis not present

## 2015-10-19 DIAGNOSIS — R292 Abnormal reflex: Secondary | ICD-10-CM

## 2015-10-19 DIAGNOSIS — R29898 Other symptoms and signs involving the musculoskeletal system: Secondary | ICD-10-CM

## 2015-10-19 DIAGNOSIS — R531 Weakness: Secondary | ICD-10-CM

## 2015-10-19 DIAGNOSIS — T451X5A Adverse effect of antineoplastic and immunosuppressive drugs, initial encounter: Secondary | ICD-10-CM

## 2015-10-19 DIAGNOSIS — H5340 Unspecified visual field defects: Secondary | ICD-10-CM

## 2015-10-19 DIAGNOSIS — IMO0001 Reserved for inherently not codable concepts without codable children: Secondary | ICD-10-CM

## 2015-10-19 LAB — CSF CELL COUNT WITH DIFFERENTIAL
RBC Count, CSF: 9 cells/uL (ref 0–10)
WBC CSF: 3 {cells}/uL (ref 0–5)

## 2015-10-19 LAB — GLUCOSE, CSF: Glucose, CSF: 65 mg/dL (ref 43–76)

## 2015-10-19 LAB — PROTEIN, CSF: TOTAL PROTEIN, CSF: 28 mg/dL (ref 15–45)

## 2015-10-19 NOTE — Discharge Instructions (Signed)

## 2015-10-25 ENCOUNTER — Other Ambulatory Visit: Payer: Self-pay | Admitting: Internal Medicine

## 2015-10-25 DIAGNOSIS — F419 Anxiety disorder, unspecified: Secondary | ICD-10-CM

## 2015-10-25 DIAGNOSIS — F32A Depression, unspecified: Secondary | ICD-10-CM

## 2015-10-25 DIAGNOSIS — F450 Somatization disorder: Secondary | ICD-10-CM

## 2015-10-25 DIAGNOSIS — F329 Major depressive disorder, single episode, unspecified: Secondary | ICD-10-CM

## 2015-10-25 DIAGNOSIS — F5105 Insomnia due to other mental disorder: Principal | ICD-10-CM

## 2015-10-26 ENCOUNTER — Other Ambulatory Visit: Payer: Self-pay | Admitting: Oncology

## 2015-10-26 ENCOUNTER — Other Ambulatory Visit (INDEPENDENT_AMBULATORY_CARE_PROVIDER_SITE_OTHER): Payer: Medicare Other

## 2015-10-26 ENCOUNTER — Other Ambulatory Visit: Payer: Self-pay | Admitting: Internal Medicine

## 2015-10-26 ENCOUNTER — Ambulatory Visit (INDEPENDENT_AMBULATORY_CARE_PROVIDER_SITE_OTHER): Payer: Medicare Other | Admitting: Neurology

## 2015-10-26 ENCOUNTER — Encounter: Payer: Self-pay | Admitting: Neurology

## 2015-10-26 ENCOUNTER — Ambulatory Visit (HOSPITAL_BASED_OUTPATIENT_CLINIC_OR_DEPARTMENT_OTHER): Payer: Medicare Other

## 2015-10-26 ENCOUNTER — Other Ambulatory Visit: Payer: Self-pay | Admitting: Adult Health

## 2015-10-26 ENCOUNTER — Other Ambulatory Visit: Payer: Self-pay | Admitting: Cardiology

## 2015-10-26 VITALS — BP 166/84 | HR 107 | Ht 66.0 in | Wt 183.0 lb

## 2015-10-26 VITALS — BP 191/99 | HR 94 | Temp 98.7°F | Resp 16

## 2015-10-26 DIAGNOSIS — G250 Essential tremor: Secondary | ICD-10-CM | POA: Diagnosis not present

## 2015-10-26 DIAGNOSIS — R002 Palpitations: Secondary | ICD-10-CM

## 2015-10-26 DIAGNOSIS — I10 Essential (primary) hypertension: Secondary | ICD-10-CM

## 2015-10-26 DIAGNOSIS — T451X5A Adverse effect of antineoplastic and immunosuppressive drugs, initial encounter: Secondary | ICD-10-CM

## 2015-10-26 DIAGNOSIS — G932 Benign intracranial hypertension: Secondary | ICD-10-CM

## 2015-10-26 DIAGNOSIS — I5032 Chronic diastolic (congestive) heart failure: Secondary | ICD-10-CM

## 2015-10-26 DIAGNOSIS — C50412 Malignant neoplasm of upper-outer quadrant of left female breast: Secondary | ICD-10-CM

## 2015-10-26 DIAGNOSIS — Z5111 Encounter for antineoplastic chemotherapy: Secondary | ICD-10-CM

## 2015-10-26 DIAGNOSIS — G620 Drug-induced polyneuropathy: Secondary | ICD-10-CM

## 2015-10-26 DIAGNOSIS — R0600 Dyspnea, unspecified: Secondary | ICD-10-CM

## 2015-10-26 MED ORDER — LIDOCAINE 5 % EX OINT: 1.0000 "application " | TOPICAL_OINTMENT | Freq: Two times a day (BID) | CUTANEOUS | 5 refills | Status: DC | PRN

## 2015-10-26 MED ORDER — DULOXETINE HCL 60 MG PO CPEP: 60.0000 mg | ORAL_CAPSULE | Freq: Every day | ORAL | 3 refills | Status: DC

## 2015-10-26 MED ORDER — PRIMIDONE 50 MG PO TABS: 25.0000 mg | ORAL_TABLET | Freq: Two times a day (BID) | ORAL | 3 refills | Status: DC

## 2015-10-26 MED ORDER — GOSERELIN ACETATE 3.6 MG ~~LOC~~ IMPL
3.6000 mg | DRUG_IMPLANT | Freq: Once | SUBCUTANEOUS | Status: AC
  Administered 2015-10-26: 3.6 mg via SUBCUTANEOUS
  Filled 2015-10-26: qty 3.6

## 2015-10-26 NOTE — Progress Notes (Signed)
Follow-up Visit   Date: 10/26/15    Erica [name] MRN: [id number] DOB: Aug 25, 1975   Interim History: Erica [name] is a 40 y.o. 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    History of present illness: Starting in 2010 after her second course of chemotherapy she developed numbness/tingling of the arm and legs, described as her hands are asleep. She has burning sensation and shaking of her hands. Activity worsens pain and she has difficulty with fine motor tasks such as buttoning, tying shoe laces, and braiding her daughter's hair. She falls about once per month because her legs give out. She was started on Lyrica 50mg  TID which has been titrated to 200mg  TID.  She history of refractory pseudotumor cerebri and has been on diamox, topirmate, and lasix.  Additionally, she continues to have 1-2 elective high volume taps annually for headaches.  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   She reports having spells of syncope, which seems to only occur when she is sitting. There is loss of consciousness, lasting only a few seconds.  Following the spells, there is no fatigue, confusion, or weakness. On one ocassion, she felt herself leaning to the side and caught herself when at therapy. She is unaware of the spells.  Routine EEG was normal.  MRI/V brain has been normal.   In 2016, her ophthalmologist, Dr. Vista Mink noticed new findings of severe left temporal visual field deficit involving her left eye (good eye). Her imaging did not disclose any intracranial abnormality to  account for symptoms.  She underwent LP with OP of 27cm of water.  Since her LP, she reports feeling as if her vision is better and eyes don't feel as foggy anymore.   She was evaluated by neurosurgery for shunt evaluation, but because of no papilledema, did not feel that she would be a good candidate.   ] She was seen by Dr. by Darrin Luis, opthalmology at [facility] in early 2017, who noted severe nerve fiber layer loss stating that papilledema is not a good marker for elevated ICP because she has such few nerve fibers.   - UPDATE 10/26/2015:   In July 2017, she sought opinion of Dr. Theda Sers at [facility] who recommended shunting as the only option to control headaches and preserve vision, but remains undecided on this.  She is leaning more and more towards VP shunt as it was explained that her vision, which is already impaired, needs to be protected.  Last week, she had an interval lumbar puncture for worsening headaches, which helped.  There have been no new vision problems. She continues to be on diamox 1000mg  TID and topiramate 50mg  BID.  She complaints of worsening tremors and bilateral leg pain.  She also has a lot of mylagias and polyarthralgias.  Medications:  Current Outpatient Prescriptions on File Prior to Visit  Medication Sig Dispense Refill  . acetaZOLAMIDE (DIAMOX) 250 MG tablet Take 4 tablets (1,000 mg total) by mouth 3 (three) times daily. 400 tablet 11  . albuterol (PROVENTIL HFA;VENTOLIN HFA) 108 (90 Base) MCG/ACT inhaler Inhale 2 puffs into the lungs every 4 (four) hours as needed for wheezing or shortness  of breath.    Marland Kitchen albuterol (PROVENTIL) (2.5 MG/3ML) 0.083% nebulizer solution USE 1 VIAL VIA NEBULIZER EVERY 4 HOURS AS NEEDED( NEED APPOINTMENT) 300 mL 2  . amLODipine (NORVASC) 10 MG tablet TAKE 1 TABLET(10 MG) BY MOUTH DAILY 90 tablet 0  . beclomethasone (QVAR) 80 MCG/ACT inhaler Inhale 2 puffs into the lungs 2 (two) times daily. 1 Inhaler 5  . Biotin 10 MG TABS Take 1 tablet  by mouth every morning.     Marland Kitchen BYSTOLIC 20 MG TABS Take 20 mg by mouth daily.  3  . chlorpheniramine (CHLOR-TRIMETON) 4 MG tablet Take 8 mg by mouth at bedtime.    . cloNIDine (CATAPRES) 0.3 MG tablet TAKE 1 TABLET BY MOUTH TWICE DAILY 180 tablet 3  . dexlansoprazole (DEXILANT) 60 MG capsule Take 60 mg by mouth daily before breakfast.     . dextromethorphan-guaiFENesin (MUCINEX DM) 30-600 MG per 12 hr tablet Take 1 tablet by mouth 2 (two) times daily.     Marland Kitchen diltiazem (CARDIZEM LA) 420 MG 24 hr tablet Take 1 tablet (420 mg total) by mouth daily. 90 tablet 3  . EPINEPHrine 0.3 mg/0.3 mL IJ SOAJ injection Inject 0.3 mLs (0.3 mg total) into the muscle once as needed (anaphylaxis reaction). 2 Device 5  . fluocinonide-emollient (LIDEX-E) 0.05 % cream Apply 1 application topically 2 (two) times daily. 60 g 1  . furosemide (LASIX) 20 MG tablet Take 1 tablet (20 mg total) by mouth daily. 90 tablet 3  . hydrALAZINE (APRESOLINE) 100 MG tablet Take 1 tablet (100 mg total) by mouth 3 (three) times daily. 90 tablet 11  . isosorbide mononitrate (IMDUR) 60 MG 24 hr tablet Take 60 mg by mouth every morning.    Marland Kitchen LORazepam (ATIVAN) 0.5 MG tablet TAKE ONE TABLET BY MOUTH EVERY 8 HOURS AS NEEDED FOR ANXIETY 90 tablet 0  . LYRICA 200 MG capsule TAKE ONE CAPSULE BY MOUTH THREE TIMES DAILY 90 capsule 0  . metoCLOPramide (REGLAN) 10 MG tablet Take 10 mg by mouth 4 (four) times daily.    . mometasone (NASONEX) 50 MCG/ACT nasal spray Place 2 sprays into the nose 2 (two) times daily. 17 g 5  . montelukast (SINGULAIR) 10 MG tablet TAKE 1 TABLET BY MOUTH EVERY NIGHT AT BEDTIME 90 tablet 1  . ondansetron (ZOFRAN-ODT) 4 MG disintegrating tablet DISSOLVE 1 TABLET(4 MG) ON THE TONGUE EVERY 8 HOURS AS NEEDED FOR NAUSEA OR VOMITING 20 tablet 3  . OXYGEN Use CPAP at bedtime    . Respiratory Therapy Supplies (FLUTTER) DEVI Use as directed 1 each 0  . spironolactone (ALDACTONE) 25 MG tablet Take 1 tablet (25 mg total) by mouth daily.  90 tablet 3  . SYMBICORT 160-4.5 MCG/ACT inhaler INHALE 2 PUFFS BY MOUTH TWICE DAILY 10.2 g 5  . topiramate (TOPAMAX) 25 MG tablet Take 25 mg by mouth 2 (two) times daily.    . traMADol (ULTRAM) 50 MG tablet TAKE 2 TABLETS EVERY 4 HOURS AS NEEDED FOR COUGH 180 tablet 0  . traZODone (DESYREL) 150 MG tablet TAKE 1 TABLET(150 MG) BY MOUTH AT BEDTIME 90 tablet 1   Current Facility-Administered Medications on File Prior to Visit  Medication Dose Route Frequency Provider Last Rate Last Dose  . goserelin (ZOLADEX) injection 3.6 mg  3.6 mg Subcutaneous Once Chauncey Cruel, MD        Allergies:  Allergies  Allergen Reactions  . Aspirin Shortness Of Breath and Palpitations    Pt can take ibuprofen without reaction  .  Doxycycline Anaphylaxis  . Penicillins Shortness Of Breath and Palpitations    Has patient had a PCN reaction causing immediate rash, facial/tongue/throat swelling, SOB or lightheadedness with hypotension: Yes Has patient had a PCN reaction causing severe rash involving mucus membranes or skin necrosis: Yes Has patient had a PCN reaction that required hospitalization Yes Has patient had a PCN reaction occurring within the last 10 years: No If all of the above answers are "NO", then may proceed with Cephalosporin use.  Severiano Gilbert Extract Itching and Swelling    Lip swelling  . Strawberry Extract Hives     Review of Systems:  CONSTITUTIONAL: No fevers, chills, night sweats, or weight loss.   EYES: +visual changes or eye pain ENT: No hearing changes.  No history of nose bleeds.   RESPIRATORY: No cough, wheezing and shortness of breath.   CARDIOVASCULAR: Negative for chest pain, and palpitations.   GI: Negative for abdominal discomfort, blood in stools or black stools.  No recent change in bowel habits.   GU:  No history of incontinence.   MUSCLOSKELETAL: No history of joint pain or swelling.  +myalgias.   SKIN: Negative for lesions, rash, and itching.   ENDOCRINE: Negative for  cold or heat intolerance, polydipsia or goiter.   PSYCH:  No depression or anxiety symptoms.   NEURO: As Above.   Vital Signs:  BP (!) 166/84 (BP Location: Left Arm, Patient Position: Sitting, Cuff Size: Large)   Pulse (!) 107   Ht 5\' 6"  (1.676 m)   Wt 183 lb (83 kg)   SpO2 97%   BMI 29.54 kg/m   Neurological Exam: MENTAL STATUS including orientation to time, place, person is normal.    CRANIAL NERVES:  She is blind in the right eye - only able to see light.  Visual field testing of the left eye is shows reduced peripheral vision on the left temporal field.  Pupils equal round and reactive to light.    MOTOR: Moderate intention tremor bilaterally, especially when outstretched and worse on the left. No pronator drift.  Tone is normal.   Motor strength is 5/5 throughout except L dorsiflexion and toe extension is 5-/5  MSRs: Reflexes are 2+/4 throughout.  SENSORY:  Intact to vibration and temperature throughout  COORDINATION/GAIT:   Gait narrow based, slow, slightly unsteady, unassisted.   Data: [facility], Dr. 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  MRI brain wwo contrast 05/18/2015: Stable MRI appearance of the brain since 2016 with moderate for age nonspecific white matter signal changes. No acute intracranial abnormality.  MRV 06/30/2015:  No evidence of venous sinus thrombosis. No focal narrowing of the transverse-sigmoid sinus junction, as can be seen in the setting of idiopathic intracranial hypotension.  MRI cervical spine wo contrast 06/07/2015: 1. Small right paracentral/foraminal disc protrusion at T2-3, closely approximating the exiting right T2 nerve root. This could potentially produce right-sided radicular symptoms. 2. Mild right foraminal narrowing at C4-5 and C5-6 related to uncovertebral hypertrophy. No other significant right-sided foraminal narrowing within the  cervical spine. 3. Small central disc protrusion at C6-7 without significant stenosis. 4. Additional tiny central disc protrusion at C2-3 without stenosis. 5. Additional minimal multilevel degenerative changes as above.  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  She was evaluated at [facility] for VP shunt and she is undecided whether to pursue this or not.   Continue diamox to 1000mg  TID and topiramate 50mg  twice daily.  She is also back on lasix 20mg  daily. Check BMP She is also seeing Dr. 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  Clinically worsening. Continue Lyrica to 200mg  TID and lidocaine ointment to her feet.   She is already taking Cymbalta for mood, so will recommend increasing this to 60mg  daily   Benign essential tremors, worsening Increase primidone to 25mg  twice daily  Return to clinic in 3 months   The duration of this appointment visit was 30 minutes of face-to-face time with the patient.  Greater than 50% of this time was spent in counseling, explanation of diagnosis, planning of further management, and coordination of care.   Thank you for allowing me to participate in patient's care.  If I can answer any additional questions, I would be pleased to do so.     Sincerely,    Donika K. Posey Pronto, DO

## 2015-10-26 NOTE — Patient Instructions (Addendum)
1.  Increase primidone to 25mg  twice daily 2.  Increase Cymbalta 60mg  daily 3.  Discuss with Dr. Theda Sers about the VP shunt 4.  Check BMP. Your provider has requested that you have labwork completed today. Please go to [facility] ([id number]) on the second floor of this building before leaving the office today. You do not need to check in. If you are not called within 15 minutes please check with the front desk.    Return to clinic in 6 months

## 2015-10-27 ENCOUNTER — Telehealth: Payer: Self-pay | Admitting: Adult Health

## 2015-10-27 ENCOUNTER — Other Ambulatory Visit: Payer: Self-pay | Admitting: *Deleted

## 2015-10-27 LAB — BASIC METABOLIC PANEL
BUN: 13 mg/dL (ref 6–23)
CHLORIDE: 103 meq/L (ref 96–112)
CO2: 31 mEq/L (ref 19–32)
Calcium: 9.4 mg/dL (ref 8.4–10.5)
Creatinine, Ser: 1.28 mg/dL — ABNORMAL HIGH (ref 0.40–1.20)
GFR: 58.78 mL/min — ABNORMAL LOW (ref >60.00)
GLUCOSE: 100 mg/dL — AB (ref 70–99)
POTASSIUM: 3.3 meq/L — AB (ref 3.5–5.1)
SODIUM: 140 meq/L (ref 135–145)

## 2015-10-27 NOTE — Telephone Encounter (Signed)
pt called to sched apt for survivorship, ssaid she was supposed to see [facility] but she left

## 2015-10-30 ENCOUNTER — Other Ambulatory Visit: Payer: Self-pay | Admitting: Internal Medicine

## 2015-10-30 DIAGNOSIS — E876 Hypokalemia: Secondary | ICD-10-CM

## 2015-11-02 ENCOUNTER — Telehealth: Payer: Self-pay | Admitting: *Deleted

## 2015-11-02 ENCOUNTER — Telehealth: Payer: Self-pay | Admitting: Oncology

## 2015-11-02 ENCOUNTER — Ambulatory Visit (HOSPITAL_BASED_OUTPATIENT_CLINIC_OR_DEPARTMENT_OTHER): Payer: Medicare Other | Admitting: Adult Health

## 2015-11-02 ENCOUNTER — Encounter: Payer: Self-pay | Admitting: Adult Health

## 2015-11-02 VITALS — BP 172/98 | HR 92 | Temp 98.8°F | Resp 18 | Ht 66.0 in | Wt 194.2 lb

## 2015-11-02 DIAGNOSIS — N939 Abnormal uterine and vaginal bleeding, unspecified: Secondary | ICD-10-CM

## 2015-11-02 DIAGNOSIS — C50412 Malignant neoplasm of upper-outer quadrant of left female breast: Secondary | ICD-10-CM

## 2015-11-02 DIAGNOSIS — Z853 Personal history of malignant neoplasm of breast: Secondary | ICD-10-CM | POA: Diagnosis not present

## 2015-11-02 DIAGNOSIS — L732 Hidradenitis suppurativa: Secondary | ICD-10-CM

## 2015-11-02 NOTE — Telephone Encounter (Signed)
per of to sch pt appt-Natro to sch Derm referral-sch mammo/appt-gave copy of avs/cal

## 2015-11-02 NOTE — Progress Notes (Signed)
CLINIC:  Survivorship   REASON FOR VISIT:  Routine follow-up for history of breast cancer.   BRIEF ONCOLOGIC HISTORY:    Breast cancer of upper-outer quadrant of left female breast ([place])    Neo-Adjuvant Chemotherapy    Docetaxel and cyclophosphamide x 4     09/2008 Definitive Surgery    Left lumpectomy/SLNB: IDC, ER/PR/ HER2 neu negative, Ki67 88%     09/2008 Pathologic Stage    Stage IIB: ypT2 ypN1     10/2008 Surgery    Full axillary dissection and re-excision of margins      - 03/2009 Chemotherapy    Paclitaxel weekly x 11      - 05/2009 Radiation Therapy    Adjuvant RT to left breast      INTERVAL HISTORY:  Ms. Agrusa presents to the [facility] today for routine follow-up for her history of breast cancer.  Ms. Fronczak has several complaints and co-morbidities.  Her blood pressure remains quite high; she tells me that her PCP is planning to put her on "a sedative blood pressure pill" to better control her blood pressure.  She has neuropathy, headaches, numbness, weakness, forgetfulness and sees a neurologist, Dr. Posey Pronto.  She has asthma, is short of breath, has a cough, and has dyspnea on exertion; her pulmonologist is Dr. Melvyn Novas. She has palpitations, chest pains, and feet swelling; she sees a cardiologist Dr. Meda Coffee.  She is "always nauseated" and has a poor appetite, as well as heartburn and abdominal pain; her GI specialist is Dr. Hilarie Fredrickson.    She has fatigue, depression, and anxiety. She has night sweats and hot flashes.    She continues on her Zoladex injections.  Per Dr. Virgie Dad note, she was supposed to see Dr. Denman George in [place] for evaluation for oopherectomy and abnormal uterine bleeding; however that visit never took place.  She continues to have vaginal bleeding/spotting "whenever my body wants to."  The spotting happens about 2x/month and lasts for about 3 times each episode. She also passes large clots. Denies any cramping. Endorses some pelvic pain.   Breast:  Denies any new nodularity, masses, tenderness, nipple changes, or nipple discharge. She has not had a mammogram since 05/19/14.    A 14-point review of systems was completed and was negative, except as noted above.    PAST MEDICAL/SURGICAL HISTORY:  Past Medical History:  Diagnosis Date  . Anemia, unspecified 04/13/2011  . Asthma   . Asthma 09/27/2010  . Bloating 03/18/2011  . Breast cancer ([place]) 2010   a. locally advanced left breast carcinoma diagnosed in 2010 and treated with neoadjuvant chemotherapy with docetaxel and cyclophosphamide as well as paclitaxel. This was followed by radiation therapy which was completed in 2011.  Marland Kitchen Dyspnea on exertion    a. 01/2013 Lexi MV: EF 54%, no ischemia/infarct;  b. 05/2013 Echo: EF 60-65%, no rwma, Gr 2 DD;  b.   . Fatty liver   . Gastroparesis   . GERD (gastroesophageal reflux disease)   . Hypertension   . Impaired glucose tolerance 04/13/2011  . Lymphadenitis, chronic    restricted LEFT extremity  . 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 OSA (obstructive sleep apnea) 05/04/2014   AHI 31/hr now on CPAP at 12cm H2O   Past Surgical History:  Procedure Laterality Date  . BREAST LUMPECTOMY Left 09/2008  . CARPAL TUNNEL RELEASE Left 2011  . LYMPH NODE DISSECTION Left 2010   breast; 2 wks after breast lumpectomy  . TENDON RELEASE Right  2012   "hand"  . TUBAL LIGATION  05/11/2011   Procedure: POST PARTUM TUBAL LIGATION;  Surgeon: Emeterio Reeve, MD;  Location: [facility];  Service: Gynecology;  Laterality: Bilateral;  Induced for HTN     ALLERGIES:  Allergies  Allergen Reactions  . Aspirin Shortness Of Breath and Palpitations    Pt can take ibuprofen without reaction  . Doxycycline Anaphylaxis  . Penicillins Shortness Of Breath and Palpitations    Has patient had a PCN reaction causing immediate rash, facial/tongue/throat swelling, SOB or lightheadedness with hypotension: Yes Has patient had a PCN reaction causing severe rash involving mucus  membranes or skin necrosis: Yes Has patient had a PCN reaction that required hospitalization Yes Has patient had a PCN reaction occurring within the last 10 years: No If all of the above answers are "NO", then may proceed with Cephalosporin use.  Severiano Gilbert Extract Itching and Swelling    Lip swelling  . Strawberry Extract Hives     CURRENT MEDICATIONS:  Outpatient Encounter Prescriptions as of 11/02/2015  Medication Sig  . acetaZOLAMIDE (DIAMOX) 250 MG tablet Take 4 tablets (1,000 mg total) by mouth 3 (three) times daily.  Marland Kitchen amLODipine (NORVASC) 10 MG tablet TAKE 1 TABLET(10 MG) BY MOUTH DAILY  . beclomethasone (QVAR) 80 MCG/ACT inhaler Inhale 2 puffs into the lungs 2 (two) times daily.  . Biotin 10 MG TABS Take 1 tablet by mouth every morning.   Marland Kitchen BYSTOLIC 20 MG TABS Take 20 mg by mouth daily.  . chlorpheniramine (CHLOR-TRIMETON) 4 MG tablet Take 8 mg by mouth at bedtime.  . cloNIDine (CATAPRES) 0.3 MG tablet TAKE 1 TABLET BY MOUTH TWICE DAILY  . dexlansoprazole (DEXILANT) 60 MG capsule Take 60 mg by mouth daily before breakfast.   . dextromethorphan-guaiFENesin (MUCINEX DM) 30-600 MG per 12 hr tablet Take 1 tablet by mouth 2 (two) times daily.   Marland Kitchen diltiazem (CARDIZEM LA) 420 MG 24 hr tablet Take 1 tablet (420 mg total) by mouth daily.  . DULoxetine (CYMBALTA) 60 MG capsule Take 1 capsule (60 mg total) by mouth daily.  . furosemide (LASIX) 20 MG tablet Take 1 tablet (20 mg total) by mouth daily.  . hydrALAZINE (APRESOLINE) 100 MG tablet Take 1 tablet (100 mg total) by mouth 3 (three) times daily.  . isosorbide mononitrate (IMDUR) 60 MG 24 hr tablet Take 60 mg by mouth every morning.  . lidocaine (XYLOCAINE) 5 % ointment Apply 1 application topically 2 (two) times daily as needed.  Marland Kitchen LORazepam (ATIVAN) 0.5 MG tablet TAKE ONE TABLET BY MOUTH EVERY 8 HOURS AS NEEDED FOR ANXIETY  . LYRICA 200 MG capsule TAKE ONE CAPSULE BY MOUTH THREE TIMES DAILY  . metoCLOPramide (REGLAN) 10 MG tablet Take 10  mg by mouth 4 (four) times daily.  . mometasone (NASONEX) 50 MCG/ACT nasal spray Place 2 sprays into the nose 2 (two) times daily.  . montelukast (SINGULAIR) 10 MG tablet TAKE 1 TABLET BY MOUTH EVERY NIGHT AT BEDTIME  . NITROSTAT 0.4 MG SL tablet PLACE 1 TABLET UNDER TONGUE EVERY 5 MINUTES AS NEEDED FOR CHEST PAIN, MAX OF 3 TABLETS THEN CALL 911 IF PAIN PERSISTS  . ondansetron (ZOFRAN-ODT) 4 MG disintegrating tablet DISSOLVE 1 TABLET(4 MG) ON THE TONGUE EVERY 8 HOURS AS NEEDED FOR NAUSEA OR VOMITING  . OXYGEN Use CPAP at bedtime  . potassium chloride SA (K-DUR,KLOR-CON) 20 MEQ tablet TAKE 1 TABLET BY MOUTH ON DAY YOU TAKE LASIX  . primidone (MYSOLINE) 50 MG tablet Take 0.5  tablets (25 mg total) by mouth 2 (two) times daily.  Marland Kitchen Respiratory Therapy Supplies (FLUTTER) DEVI Use as directed  . spironolactone (ALDACTONE) 25 MG tablet Take 1 tablet (25 mg total) by mouth daily.  . SYMBICORT 160-4.5 MCG/ACT inhaler INHALE 2 PUFFS BY MOUTH TWICE DAILY  . topiramate (TOPAMAX) 25 MG tablet Take 25 mg by mouth 2 (two) times daily.  . traMADol (ULTRAM) 50 MG tablet TAKE 2 TABLETS EVERY 4 HOURS AS NEEDED FOR COUGH  . traZODone (DESYREL) 150 MG tablet TAKE 1 TABLET(150 MG) BY MOUTH AT BEDTIME  . VENTOLIN HFA 108 (90 Base) MCG/ACT inhaler INHALE 2 PUFFS INTO THE LUNGS EVERY 4 HOURS AS NEEDED FOR WHEEZING OR SHORTNESS OF BREATH  . albuterol (PROVENTIL) (2.5 MG/3ML) 0.083% nebulizer solution USE 1 VIAL VIA NEBULIZER EVERY 4 HOURS AS NEEDED( NEED APPOINTMENT)  . EPINEPHrine 0.3 mg/0.3 mL IJ SOAJ injection Inject 0.3 mLs (0.3 mg total) into the muscle once as needed (anaphylaxis reaction).  . fluocinonide-emollient (LIDEX-E) 0.05 % cream Apply 1 application topically 2 (two) times daily.   Facility-Administered Encounter Medications as of 11/02/2015  Medication  . goserelin (ZOLADEX) injection 3.6 mg     ONCOLOGIC FAMILY HISTORY:  Family History  Problem Relation Age of Onset  . Diabetes Maternal Grandmother     . Heart disease Maternal Grandmother   . Hypertension Maternal Grandmother   . Cancer Neg Hx   . Alcohol abuse Neg Hx   . Early death Neg Hx   . Hyperlipidemia Neg Hx   . Kidney disease Neg Hx   . Stroke Neg Hx   . Colon cancer Neg Hx      SOCIAL HISTORY:  JULIO STORR lives in [place], [place]. She denies any current or history of tobacco, alcohol, or illicit drug use.     PHYSICAL EXAMINATION:  Vital Signs: Vitals:   11/02/15 1114 11/02/15 1140  BP: (!) 174/107 (!) 172/98  Pulse: 92   Resp: 18   Temp: 98.8 F (37.1 C)    Filed Weights   11/02/15 1114  Weight: 194 lb 3.2 oz (88.1 kg)   General: Well-nourished female in no acute distress.  She is accompanied by family today.   HEENT: Head is normocephalic.  Pupils equal and reactive to light and accomodation. Conjunctivae clear without exudate.  Sclerae anicteric. Oral mucosa is pink, moist.  Oropharynx is pink without lesions or erythema.  Lymph: No cervical, supraclavicular, or infraclavicular lymphadenopathy noted on palpation.  Cardiovascular: Regular rate and rhythm.Marland Kitchen Respiratory: Expiratory wheezes. Mild rhonchi throughout. Chest expansion symmetric; breathing non-labored.  Breast Exam:  -Right breast: No appreciable masses on palpation. No skin redness, thickening, or peau d'orange appearance; no nipple retraction or nipple discharge.  -Left breast: No appreciable masses on palpation. No skin redness, thickening, or peau d'orange appearance; no nipple retraction or nipple discharge; mild distortion in symmetry at previous lumpectomy site; healed scar without erythema or nodularity.  -Axilla: No axillary adenopathy bilaterally. However, there are several clogged sweat glands/hair follicles in the axilla; left worse than right.   GI: Abdomen soft and round. Some suprapubic tenderness to palpation, (L) worse than (R). Bowel sounds normoactive. No hepatosplenomegaly.   GU: Deferred.  Neuro: She is drowsy. Steady gait.   Psych: Mood and affect normal and appropriate for situation.  Extremities: No edema. Skin: Warm and dry.  LABORATORY DATA:  None for this visit.    DIAGNOSTIC IMAGING:  Last Mammogram:  FINDINGS: Digital bilateral diagnostic mammography demonstrates scattered fibroglandular densities.  The left breast is smaller in size when compared to the right breast which represents a stable finding following conservation therapy. There is stable postsurgical scarring identified in the posterior upper outer quadrant of the left breast. Spot-compression magnification view of the lumpectomy bed in the tangential projection reveals stable postsurgical scarring and effacement of normal fibroglandular tissue no new mass or suspicious microcalcification. There has been no significant interval change from comparison mammography.  Mammographic images were processed with CAD.  IMPRESSION: Post lumpectomy scarring of the left breast on mammographic findings of malignancy.     ASSESSMENT AND PLAN:  Ms.. Kobel is a pleasant 40 y.o. female with history of Stage IIB left breast invasive ductal carcinoma, ER-/PR-/HER2-, diagnosed in 2009, treated with neoadjuvant chemo with Taxotere/Cytoxan x 4, lumpectomy, full axillary dissection and re-excision, followed by adjuvant radiation therapy.  [facility] for [facility] and routine follow-up.   1. History of left breast cancer:  Ms. Guadiana is currently clinically without evidence of diease or recurrence of breast cancer. She has not had a mammogram since 05/2014 and is overdue for her 2017 exam.  I have placed orders today for her to get a mammogram at the [facility] as soon as she's able. She will follow-up with her medical oncologist, Dr. [name] next year in 10/2016 after her 2018 mammogram.   2. Abnormal uterine bleeding: Ms. Nordell has been receiving her Zoladex injections since 04/2014 and any uterine bleeding at this point  would be considered abnormal.  Therefore, I have arranged for her to see Dr. Gillian Scarce at [facility] on 11/08/15; the patient was given this appointment prior to leaving the office today and was encouraged to come 30 minutes before the scheduled appointment time.    3. Hidradenitis of the axilla: Ms. Mates has several clogged pores in her axilla; the left worse than the right.  This has been a chronic issue for her; she tells me they were previously treated with excision in the office of her breast surgeon and twice in the ER on different occasions.  There is no axillary adenopathy on physical exam and this finding is likely unrelated to her breast cancer.  However, I did place a referral for her to see a dermatologist to have this addressed, per her request.  She will see Dr. Allyn Kenner on 11/14/15 for evaluation and treatment.    4. Multiple comorbidities/complaints:  I encouraged Ms. Abruzzo to follow up with her specialists regarding her current concerns.  She tells me she is scheduled to see her PCP sometime in September; I encouraged her to call and move up that appointment to something this month to be evaluated, particularly as it relates to her hypertension management.  She states she will call her PCP and move up that appointment to sometime in August.       Dispo:  -Annual mammogram past due for 2017; orders placed today.  -Return to cancer center to see Dr. [name] in 10/2016 after annual mammogram.     A total of 30 minutes of face-to-face time was spent with this patient with greater than 50% of that time in counseling and care-coordination.   Mike Craze, NP Survivorship [facility] [place] [phone]   Note: PRIMARY CARE PROVIDER Scarlette Calico, MD [phone]

## 2015-11-02 NOTE — Telephone Encounter (Signed)
Called and spoke with pt to inform her that an appt has been made with the dermatologist, Lovey Newcomer. MD is August 15th at 12:00. I told pt to be there at 11:45. I have given pt the address to the doctor's office. Pt wrote down and recalled what I said. Pt verbalizes understanding . No further concerns.

## 2015-11-03 ENCOUNTER — Ambulatory Visit (INDEPENDENT_AMBULATORY_CARE_PROVIDER_SITE_OTHER): Payer: Medicare Other | Admitting: Internal Medicine

## 2015-11-03 ENCOUNTER — Encounter: Payer: Self-pay | Admitting: Internal Medicine

## 2015-11-03 VITALS — BP 159/101 | HR 101 | Temp 98.4°F | Resp 16 | Ht 66.0 in | Wt 192.0 lb

## 2015-11-03 DIAGNOSIS — R29818 Other symptoms and signs involving the nervous system: Secondary | ICD-10-CM

## 2015-11-03 DIAGNOSIS — F419 Anxiety disorder, unspecified: Secondary | ICD-10-CM

## 2015-11-03 DIAGNOSIS — I10 Essential (primary) hypertension: Secondary | ICD-10-CM

## 2015-11-03 DIAGNOSIS — F5105 Insomnia due to other mental disorder: Secondary | ICD-10-CM | POA: Diagnosis not present

## 2015-11-03 DIAGNOSIS — M792 Neuralgia and neuritis, unspecified: Secondary | ICD-10-CM

## 2015-11-03 MED ORDER — ESZOPICLONE 3 MG PO TABS: 3.0000 mg | ORAL_TABLET | Freq: Every evening | ORAL | 3 refills | Status: DC | PRN

## 2015-11-03 MED ORDER — NEBIVOLOL HCL 20 MG PO TABS: ORAL_TABLET | ORAL | 3 refills | Status: DC

## 2015-11-03 MED ORDER — AMITRIPTYLINE HCL 100 MG PO TABS: 100.0000 mg | ORAL_TABLET | Freq: Every day | ORAL | 1 refills | Status: DC

## 2015-11-03 NOTE — Assessment & Plan Note (Addendum)
Pt unaware of prior rx for lunesta, will refill now,  to f/u any worsening symptoms or concerns

## 2015-11-03 NOTE — Assessment & Plan Note (Signed)
Uncontrolled, for increased bystolic to 40 mg, cont all other meds,  to f/u any worsening symptoms or concerns

## 2015-11-03 NOTE — Assessment & Plan Note (Signed)
[place] for trial elavil qhs, d/c trazodone

## 2015-11-03 NOTE — Patient Instructions (Addendum)
Ok to increase the bystolic to 40 mg (so take 2 of the 20 mg tablets)  OK to stop the trazodone  Please take all new medication as prescribed - the Elavil (amytriptylene) at night  Please take all new medication as prescribed - the lunesta  Please continue all other medications as before, and refills have been done if requested.  Please have the pharmacy call with any other refills you may need.  Please keep your appointments with your specialists as you may have planned  Please return in 1 month to Dr [name], or sooner if needed

## 2015-11-03 NOTE — Progress Notes (Signed)
Subjective:    Patient ID: Erica [name], female    DOB: 1976/03/30, 40 y.o.   MRN: [id number]  HPI  Here as cannot wait to see PCP; Pt denies chest pain, increased sob or doe, wheezing, orthopnea, PND, increased LE swelling, palpitations, dizziness or syncope.   Pt denies polydipsia, polyuria, .  Pt states overall good compliance with meds. Pt denies new neurological symptoms such as new headache, or facial or extremity weakness or numbness, but has worsening neuropathic burning pain now moderate to severe not well controlled.  Pain is marked and associated also with difficulty nightly with getting to sleep, Denies worsening depressive symptoms, suicidal ideation, or panic.  Trazodone not working for pain or sleep.  Has not tried the [place], states not aware of rx available at pharmacy (verified pt has not picked up) Past Medical History:  Diagnosis Date  . Anemia, unspecified 04/13/2011  . Asthma   . Asthma 09/27/2010  . Bloating 03/18/2011  . Breast cancer ([place]) 2010   a. locally advanced left breast carcinoma diagnosed in 2010 and treated with neoadjuvant chemotherapy with docetaxel and cyclophosphamide as well as paclitaxel. This was followed by radiation therapy which was completed in 2011.  Marland Kitchen Dyspnea on exertion    a. 01/2013 Lexi MV: EF 54%, no ischemia/infarct;  b. 05/2013 Echo: EF 60-65%, no rwma, Gr 2 DD;  b.   . Fatty liver   . Gastroparesis   . GERD (gastroesophageal reflux disease)   . Hypertension   . Impaired glucose tolerance 04/13/2011  . Lymphadenitis, chronic    restricted LEFT extremity  . 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 OSA (obstructive sleep apnea) 05/04/2014   AHI 31/hr now on CPAP at 12cm H2O   Past Surgical History:  Procedure Laterality Date  . BREAST LUMPECTOMY Left 09/2008  . CARPAL TUNNEL RELEASE Left 2011  . LYMPH NODE DISSECTION Left 2010   breast; 2 wks after breast lumpectomy  . TENDON RELEASE Right 2012   "hand"  . TUBAL LIGATION  05/11/2011     Procedure: POST PARTUM TUBAL LIGATION;  Surgeon: Emeterio Reeve, MD;  Location: [facility];  Service: Gynecology;  Laterality: Bilateral;  Induced for HTN    reports that she has never smoked. She has never used smokeless tobacco. She reports that she drinks alcohol. She reports that she does not use drugs. family history includes Diabetes in her maternal grandmother; Heart disease in her maternal grandmother; Hypertension in her maternal grandmother. Allergies  Allergen Reactions  . Aspirin Shortness Of Breath and Palpitations    Pt can take ibuprofen without reaction  . Doxycycline Anaphylaxis  . Penicillins Shortness Of Breath and Palpitations    Has patient had a PCN reaction causing immediate rash, facial/tongue/throat swelling, SOB or lightheadedness with hypotension: Yes Has patient had a PCN reaction causing severe rash involving mucus membranes or skin necrosis: Yes Has patient had a PCN reaction that required hospitalization Yes Has patient had a PCN reaction occurring within the last 10 years: No If all of the above answers are "NO", then may proceed with Cephalosporin use.  Severiano Gilbert Extract Itching and Swelling    Lip swelling  . Strawberry Extract Hives   Current Outpatient Prescriptions on File Prior to Visit  Medication Sig Dispense Refill  . acetaZOLAMIDE (DIAMOX) 250 MG tablet Take 4 tablets (1,000 mg total) by mouth 3 (three) times daily. 400 tablet 11  . albuterol (PROVENTIL) (2.5 MG/3ML) 0.083% nebulizer solution USE 1 VIAL VIA  NEBULIZER EVERY 4 HOURS AS NEEDED( NEED APPOINTMENT) 300 mL 2  . amLODipine (NORVASC) 10 MG tablet TAKE 1 TABLET(10 MG) BY MOUTH DAILY 90 tablet 0  . beclomethasone (QVAR) 80 MCG/ACT inhaler Inhale 2 puffs into the lungs 2 (two) times daily. 1 Inhaler 5  . Biotin 10 MG TABS Take 1 tablet by mouth every morning.     . chlorpheniramine (CHLOR-TRIMETON) 4 MG tablet Take 8 mg by mouth at bedtime.    . cloNIDine (CATAPRES) 0.3 MG tablet TAKE 1 TABLET BY  MOUTH TWICE DAILY 180 tablet 3  . dexlansoprazole (DEXILANT) 60 MG capsule Take 60 mg by mouth daily before breakfast.     . dextromethorphan-guaiFENesin (MUCINEX DM) 30-600 MG per 12 hr tablet Take 1 tablet by mouth 2 (two) times daily.     Marland Kitchen diltiazem (CARDIZEM LA) 420 MG 24 hr tablet Take 1 tablet (420 mg total) by mouth daily. 90 tablet 3  . DULoxetine (CYMBALTA) 60 MG capsule Take 1 capsule (60 mg total) by mouth daily. 90 capsule 3  . EPINEPHrine 0.3 mg/0.3 mL IJ SOAJ injection Inject 0.3 mLs (0.3 mg total) into the muscle once as needed (anaphylaxis reaction). 2 Device 5  . fluocinonide-emollient (LIDEX-E) 0.05 % cream Apply 1 application topically 2 (two) times daily. 60 g 1  . hydrALAZINE (APRESOLINE) 100 MG tablet Take 1 tablet (100 mg total) by mouth 3 (three) times daily. 90 tablet 11  . isosorbide mononitrate (IMDUR) 60 MG 24 hr tablet Take 60 mg by mouth every morning.    . lidocaine (XYLOCAINE) 5 % ointment Apply 1 application topically 2 (two) times daily as needed. 50 g 5  . LORazepam (ATIVAN) 0.5 MG tablet TAKE ONE TABLET BY MOUTH EVERY 8 HOURS AS NEEDED FOR ANXIETY 90 tablet 0  . LYRICA 200 MG capsule TAKE ONE CAPSULE BY MOUTH THREE TIMES DAILY 90 capsule 0  . metoCLOPramide (REGLAN) 10 MG tablet Take 10 mg by mouth 4 (four) times daily.    . mometasone (NASONEX) 50 MCG/ACT nasal spray Place 2 sprays into the nose 2 (two) times daily. 17 g 5  . montelukast (SINGULAIR) 10 MG tablet TAKE 1 TABLET BY MOUTH EVERY NIGHT AT BEDTIME 90 tablet 1  . NITROSTAT 0.4 MG SL tablet PLACE 1 TABLET UNDER TONGUE EVERY 5 MINUTES AS NEEDED FOR CHEST PAIN, MAX OF 3 TABLETS THEN CALL 911 IF PAIN PERSISTS 100 tablet 0  . ondansetron (ZOFRAN-ODT) 4 MG disintegrating tablet DISSOLVE 1 TABLET(4 MG) ON THE TONGUE EVERY 8 HOURS AS NEEDED FOR NAUSEA OR VOMITING 20 tablet 3  . OXYGEN Use CPAP at bedtime    . potassium chloride SA (K-DUR,KLOR-CON) 20 MEQ tablet TAKE 1 TABLET BY MOUTH ON DAY YOU TAKE LASIX 30  tablet 10  . Respiratory Therapy Supplies (FLUTTER) DEVI Use as directed 1 each 0  . spironolactone (ALDACTONE) 25 MG tablet Take 1 tablet (25 mg total) by mouth daily. 90 tablet 3  . SYMBICORT 160-4.5 MCG/ACT inhaler INHALE 2 PUFFS BY MOUTH TWICE DAILY 10.2 g 5  . topiramate (TOPAMAX) 25 MG tablet Take 25 mg by mouth 2 (two) times daily.    . traMADol (ULTRAM) 50 MG tablet TAKE 2 TABLETS EVERY 4 HOURS AS NEEDED FOR COUGH 180 tablet 0  . VENTOLIN HFA 108 (90 Base) MCG/ACT inhaler INHALE 2 PUFFS INTO THE LUNGS EVERY 4 HOURS AS NEEDED FOR WHEEZING OR SHORTNESS OF BREATH 18 g 5   Current Facility-Administered Medications on File Prior to Visit  Medication Dose Route Frequency Provider Last Rate Last Dose  . goserelin (ZOLADEX) injection 3.6 mg  3.6 mg Subcutaneous Once Chauncey Cruel, MD       Review of Systems  Constitutional: Negative for unusual diaphoresis or night sweats HENT: Negative for ear swelling or discharge Eyes: Negative for worsening visual haziness  Respiratory: Negative for choking and stridor.   Gastrointestinal: Negative for distension or worsening eructation Genitourinary: Negative for retention or change in urine volume.  Musculoskeletal: Negative for other MSK pain or swelling Skin: Negative for color change and worsening wound Neurological: Negative for tremors and numbness other than noted  Psychiatric/Behavioral: Negative for decreased concentration or agitation other than above       Objective:   Physical Exam BP (!) 159/101   Pulse (!) 101   Temp 98.4 F (36.9 C) (Oral)   Resp 16   Ht 5\' 6"  (1.676 m)   Wt 192 lb (87.1 kg)   SpO2 97%   BMI 30.99 kg/m  VS noted,  Constitutional: Pt appears in no apparent distress HENT: Head: NCAT.  Right Ear: External ear normal.  Left Ear: External ear normal.  Eyes: . Pupils are equal, round, and reactive to light. Conjunctivae and EOM are normal Neck: Normal range of motion. Neck supple.  Cardiovascular: Normal  rate and regular rhythm.   Pulmonary/Chest: Effort normal and breath sounds without rales or wheezing.  Neurological: Pt is alert. Not confused , motor grossly intact, decresaed sens to LT to LE's Skin: Skin is warm. No rash, no LE edema Psychiatric: Pt behavior is normal. No agitation. 1+ nervous    Assessment & Plan:

## 2015-11-06 ENCOUNTER — Telehealth: Payer: Self-pay | Admitting: *Deleted

## 2015-11-06 ENCOUNTER — Ambulatory Visit
Admission: RE | Admit: 2015-11-06 | Discharge: 2015-11-06 | Disposition: A | Discharge status: Home / Self Care | Payer: Medicare Other | Source: Ambulatory Visit | Attending: Adult Health | Admitting: Adult Health

## 2015-11-06 DIAGNOSIS — R928 Other abnormal and inconclusive findings on diagnostic imaging of breast: Secondary | ICD-10-CM | POA: Diagnosis not present

## 2015-11-06 DIAGNOSIS — C50412 Malignant neoplasm of upper-outer quadrant of left female breast: Secondary | ICD-10-CM

## 2015-11-06 NOTE — Telephone Encounter (Signed)
Called pt to make sure she knew the results of today's mammo. No answer, but left a detailed message that her test showed no evidence of malignancy. I also left a message to remind her of appt on Thurs with Dr. Josefine Class) at the [facility]. She knows to call if she has any questions. Message to be fwd to [employer].

## 2015-11-08 ENCOUNTER — Other Ambulatory Visit: Payer: Self-pay | Admitting: Adult Health

## 2015-11-08 ENCOUNTER — Ambulatory Visit: Payer: Medicare Other | Attending: Gynecologic Oncology | Admitting: Gynecologic Oncology

## 2015-11-08 ENCOUNTER — Encounter: Payer: Self-pay | Admitting: Adult Health

## 2015-11-08 ENCOUNTER — Telehealth: Payer: Self-pay | Admitting: Adult Health

## 2015-11-08 ENCOUNTER — Telehealth: Payer: Self-pay | Admitting: *Deleted

## 2015-11-08 ENCOUNTER — Encounter: Payer: Self-pay | Admitting: Gynecologic Oncology

## 2015-11-08 VITALS — BP 174/102 | HR 100 | Temp 98.3°F | Resp 20 | Ht 66.0 in | Wt 186.2 lb

## 2015-11-08 DIAGNOSIS — Z79899 Other long term (current) drug therapy: Secondary | ICD-10-CM | POA: Insufficient documentation

## 2015-11-08 DIAGNOSIS — N938 Other specified abnormal uterine and vaginal bleeding: Secondary | ICD-10-CM | POA: Diagnosis not present

## 2015-11-08 DIAGNOSIS — J45909 Unspecified asthma, uncomplicated: Secondary | ICD-10-CM | POA: Diagnosis not present

## 2015-11-08 DIAGNOSIS — Z853 Personal history of malignant neoplasm of breast: Secondary | ICD-10-CM | POA: Insufficient documentation

## 2015-11-08 DIAGNOSIS — N939 Abnormal uterine and vaginal bleeding, unspecified: Secondary | ICD-10-CM | POA: Diagnosis not present

## 2015-11-08 DIAGNOSIS — K219 Gastro-esophageal reflux disease without esophagitis: Secondary | ICD-10-CM | POA: Insufficient documentation

## 2015-11-08 DIAGNOSIS — Z9889 Other specified postprocedural states: Secondary | ICD-10-CM | POA: Diagnosis not present

## 2015-11-08 DIAGNOSIS — Z880 Allergy status to penicillin: Secondary | ICD-10-CM | POA: Diagnosis not present

## 2015-11-08 DIAGNOSIS — G4733 Obstructive sleep apnea (adult) (pediatric): Secondary | ICD-10-CM | POA: Diagnosis not present

## 2015-11-08 DIAGNOSIS — D649 Anemia, unspecified: Secondary | ICD-10-CM | POA: Diagnosis not present

## 2015-11-08 DIAGNOSIS — Z888 Allergy status to other drugs, medicaments and biological substances status: Secondary | ICD-10-CM | POA: Insufficient documentation

## 2015-11-08 DIAGNOSIS — C50412 Malignant neoplasm of upper-outer quadrant of left female breast: Secondary | ICD-10-CM

## 2015-11-08 DIAGNOSIS — I10 Essential (primary) hypertension: Secondary | ICD-10-CM | POA: Diagnosis not present

## 2015-11-08 NOTE — Telephone Encounter (Signed)
lvm to inform pt of genetics appt 9/7 at 1 pm

## 2015-11-08 NOTE — Progress Notes (Signed)
[facility] was reviewed for this patient with the help of Aaron Edelman in HIM.  See documentation from 07/31/2008 encounter below.  She had Medicaid at the time, which would not cover genetic testing.  According to this letter, the patient was referred to [facility], but it is unclear if she actually received genetic testing there.  There were no subsequent notes available for review.    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  She is being considered for potential hysterectomy/bilateral salpingo-oopherectomy with Dr. Cherlyn Roberts in [place], who is requesting genetic testing for this patient.    We have called the patient to let her know that someone will be in touch with her regarding an upcoming Genetics appointment here at the cancer center when it is scheduled.   Mike Craze, NP [place] [phone]

## 2015-11-08 NOTE — Progress Notes (Signed)
Patient's initial B/P 183/110 ,B/P was reassessed B/P 174/102 , Dr Gillian Scarce updated.

## 2015-11-08 NOTE — Telephone Encounter (Signed)
Called pt to remind her about appt today with Dr. Josefine Class). Pt confirmed she would be here for the appt. No further concerns.

## 2015-11-08 NOTE — Patient Instructions (Signed)
We will contact you with the results of your biopsy from today.  Dr. Carlis Abbott is also recommending evaluation with a genetics counselor.  Please call for any questions or concerns.  Endometrial Biopsy, Care After Refer to this sheet in the next few weeks. These instructions provide you with information on caring for yourself after your procedure. Your health care provider may also give you more specific instructions. Your treatment has been planned according to current medical practices, but problems sometimes occur. Call your health care provider if you have any problems or questions after your procedure. WHAT TO EXPECT AFTER THE PROCEDURE After your procedure, it is typical to have the following:  You may have mild cramping and a small amount of vaginal bleeding for a few days after the procedure. This is normal. HOME CARE INSTRUCTIONS  Only take over-the-counter or prescription medicine as directed by your health care provider.  Do not douche, use tampons, or have sexual intercourse until your health care provider approves.  Follow your health care provider's instructions regarding any activity restrictions, such as strenuous exercise or heavy lifting. SEEK MEDICAL CARE IF:  You have heavy bleeding or bleeding longer than 2 days after the procedure.  You have bad smelling drainage from your vagina.  You have a fever and chills.  Youhave severe lower stomach (abdominal) pain. SEEK IMMEDIATE MEDICAL CARE IF:  You have severe cramps in your stomach or back.  You pass large blood clots.  Your bleeding increases.  You become weak or lightheaded, or you pass out.   This information is not intended to replace advice given to you by your health care provider. Make sure you discuss any questions you have with your health care provider.   Document Released: 01/06/2013 Document Reviewed: 01/06/2013 Elsevier Interactive Patient Education [employer].

## 2015-11-08 NOTE — Progress Notes (Signed)
GYNECOLOGIC ONCOLOGY NEW PATIENT CONSULTATION  Date of Service: 11/08/15 Referring Provider: Lurline Del, MD Consulting Provider: Marcello Fennel. Carlis Abbott, MD  HISTORY OF PRESENT ILLNESS: Erica [name] is a 40 y.o. woman who is seen in consultation at the request of Dr. [name] for evaluation of abnormal uterine bleeding in the setting of personal history of breast cancer on Zoladex injections.  Ms. Emrich reports long standing AUB. She underwent evaluation with pelvic ultrasound in September 2016. This was essentially normal. She reports she bleeds 1-2 times per month for 3-5 days at a time. She reports some clot passage. She denies blood transfusion. She has not undergone EMB for evaluation.  Her PMH is significant for breast cancer in 2010 at age 34. She had an uncomplicated delivery of her son 3 years later. She does have asthma and SOB, but denies smoking. She has been worked up for her SOB with normal echo. She has a history of anemia, but does not know a cause. She has no significant family history, particularly negative for breast or ovarian cancer.   PAST MEDICAL HISTORY: Past Medical History:  Diagnosis Date  . Anemia, unspecified 04/13/2011  . Asthma   . Asthma 09/27/2010  . Bloating 03/18/2011  . Breast cancer ([place]) 2010   a. locally advanced left breast carcinoma diagnosed in 2010 and treated with neoadjuvant chemotherapy with docetaxel and cyclophosphamide as well as paclitaxel. This was followed by radiation therapy which was completed in 2011.  Marland Kitchen Dyspnea on exertion    a. 01/2013 Lexi MV: EF 54%, no ischemia/infarct;  b. 05/2013 Echo: EF 60-65%, no rwma, Gr 2 DD;  b.   . Fatty liver   . Gastroparesis   . GERD (gastroesophageal reflux disease)   . Hypertension   . Impaired glucose tolerance 04/13/2011  . Lymphadenitis, chronic    restricted LEFT extremity  . 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 OSA (obstructive sleep apnea) 05/04/2014   AHI 31/hr now on CPAP at 12cm H2O     PAST SURGICAL HISTORY: Past Surgical History:  Procedure Laterality Date  . BREAST LUMPECTOMY Left 09/2008  . CARPAL TUNNEL RELEASE Left 2011  . LYMPH NODE DISSECTION Left 2010   breast; 2 wks after breast lumpectomy  . TENDON RELEASE Right 2012   "hand"  . TUBAL LIGATION  05/11/2011   Procedure: POST PARTUM TUBAL LIGATION;  Surgeon: Emeterio Reeve, MD;  Location: [name];  Service: Gynecology;  Laterality: Bilateral;  Induced for HTN    OB/GYN HISTORY: OB History  Gravida Para Term Preterm AB Living  _0 SAB TAB Ectopic Multiple Live Births  1       4    # Outcome Date GA Lbr Len/2nd Weight Sex Delivery Anes PTL Lv  5 Term 06/07/11 [redacted]w[redacted]d/ 00:46 6 lb 12.5 oz (3.075 kg) M Vag-Spont EPI  LIV     Birth Comments: none  4 SAB 04/02/08 191w0d     FD     Birth Comments: PPROM at 179eeks, then had retained placenta with D&C afterward  3 Term 06/01/07    F Vag-Spont EPI  LIV  2 Term 08/20/96    F Vag-Spont None  LIV  1 Term 12/15/93 [id number] Vag-Spont None  LIV       Age at menopause: n/a Hx of HRT: denies Hx of STDs: denies Last pap: 2016 History of abnormal pap smears: denies  MEDICATIONS:  Current Outpatient  Prescriptions:  .  acetaZOLAMIDE (DIAMOX) 250 MG tablet, Take 4 tablets (1,000 mg total) by mouth 3 (three) times daily., Disp: 400 tablet, Rfl: 11 .  albuterol (PROVENTIL) (2.5 MG/3ML) 0.083% nebulizer solution, USE 1 VIAL VIA NEBULIZER EVERY 4 HOURS AS NEEDED( NEED APPOINTMENT), Disp: 300 mL, Rfl: 2 .  amitriptyline (ELAVIL) 100 MG tablet, Take 1 tablet (100 mg total) by mouth at bedtime., Disp: 90 tablet, Rfl: 1 .  amLODipine (NORVASC) 10 MG tablet, TAKE 1 TABLET(10 MG) BY MOUTH DAILY, Disp: 90 tablet, Rfl: 0 .  beclomethasone (QVAR) 80 MCG/ACT inhaler, Inhale 2 puffs into the lungs 2 (two) times daily., Disp: 1 Inhaler, Rfl: 5 .  Biotin 10 MG TABS, Take 1 tablet by mouth every morning. , Disp: , Rfl:  .  chlorpheniramine (CHLOR-TRIMETON) 4 MG tablet, Take 8  mg by mouth at bedtime., Disp: , Rfl:  .  cloNIDine (CATAPRES) 0.3 MG tablet, TAKE 1 TABLET BY MOUTH TWICE DAILY, Disp: 180 tablet, Rfl: 3 .  dexlansoprazole (DEXILANT) 60 MG capsule, Take 60 mg by mouth daily before breakfast. , Disp: , Rfl:  .  dextromethorphan-guaiFENesin (MUCINEX DM) 30-600 MG per 12 hr tablet, Take 1 tablet by mouth 2 (two) times daily. , Disp: , Rfl:  .  diltiazem (CARDIZEM LA) 420 MG 24 hr tablet, Take 1 tablet (420 mg total) by mouth daily., Disp: 90 tablet, Rfl: 3 .  DULoxetine (CYMBALTA) 60 MG capsule, Take 1 capsule (60 mg total) by mouth daily., Disp: 90 capsule, Rfl: 3 .  EPINEPHrine 0.3 mg/0.3 mL IJ SOAJ injection, Inject 0.3 mLs (0.3 mg total) into the muscle once as needed (anaphylaxis reaction)., Disp: 2 Device, Rfl: 5 .  Eszopiclone 3 MG TABS, Take 1 tablet (3 mg total) by mouth at bedtime as needed., Disp: 30 tablet, Rfl: 3 .  fluocinonide-emollient (LIDEX-E) 0.05 % cream, Apply 1 application topically 2 (two) times daily., Disp: 60 g, Rfl: 1 .  furosemide (LASIX) 40 MG tablet, TK 1 T PO D PRN, Disp: , Rfl: 9 .  hydrALAZINE (APRESOLINE) 100 MG tablet, Take 1 tablet (100 mg total) by mouth 3 (three) times daily., Disp: 90 tablet, Rfl: 11 .  hydrochlorothiazide (MICROZIDE) 12.5 MG capsule, , Disp: , Rfl: 1 .  hyoscyamine (LEVSIN SL) 0.125 MG SL tablet, Place under the tongue., Disp: , Rfl:  .  isosorbide mononitrate (IMDUR) 60 MG 24 hr tablet, Take 60 mg by mouth every morning., Disp: , Rfl:  .  lidocaine (XYLOCAINE) 5 % ointment, Apply 1 application topically 2 (two) times daily as needed., Disp: 50 g, Rfl: 5 .  LORazepam (ATIVAN) 0.5 MG tablet, TAKE ONE TABLET BY MOUTH EVERY 8 HOURS AS NEEDED FOR ANXIETY, Disp: 90 tablet, Rfl: 0 .  LYRICA 200 MG capsule, TAKE ONE CAPSULE BY MOUTH THREE TIMES DAILY, Disp: 90 capsule, Rfl: 0 .  metoCLOPramide (REGLAN) 10 MG tablet, Take 10 mg by mouth 4 (four) times daily., Disp: , Rfl:  .  mometasone (NASONEX) 50 MCG/ACT nasal  spray, Place 2 sprays into the nose 2 (two) times daily., Disp: 17 g, Rfl: 5 .  montelukast (SINGULAIR) 10 MG tablet, TAKE 1 TABLET BY MOUTH EVERY NIGHT AT BEDTIME, Disp: 90 tablet, Rfl: 1 .  Nebivolol HCl (BYSTOLIC) 20 MG TABS, 2 tabs by mouth per day, Disp: 180 tablet, Rfl: 3 .  NITROSTAT 0.4 MG SL tablet, PLACE 1 TABLET UNDER TONGUE EVERY 5 MINUTES AS NEEDED FOR CHEST PAIN, MAX OF 3 TABLETS THEN CALL 911 IF  PAIN PERSISTS, Disp: 100 tablet, Rfl: 0 .  ondansetron (ZOFRAN-ODT) 4 MG disintegrating tablet, DISSOLVE 1 TABLET(4 MG) ON THE TONGUE EVERY 8 HOURS AS NEEDED FOR NAUSEA OR VOMITING, Disp: 20 tablet, Rfl: 3 .  OXYGEN, Use CPAP at bedtime, Disp: , Rfl:  .  potassium chloride SA (K-DUR,KLOR-CON) 20 MEQ tablet, TAKE 1 TABLET BY MOUTH ON DAY YOU TAKE LASIX, Disp: 30 tablet, Rfl: 10 .  primidone (MYSOLINE) 50 MG tablet, Take by mouth., Disp: , Rfl:  .  Respiratory Therapy Supplies (FLUTTER) DEVI, Use as directed, Disp: 1 each, Rfl: 0 .  spironolactone (ALDACTONE) 25 MG tablet, Take 1 tablet (25 mg total) by mouth daily., Disp: 90 tablet, Rfl: 3 .  SYMBICORT 160-4.5 MCG/ACT inhaler, INHALE 2 PUFFS BY MOUTH TWICE DAILY, Disp: 10.2 g, Rfl: 5 .  topiramate (TOPAMAX) 25 MG tablet, Take 25 mg by mouth 2 (two) times daily., Disp: , Rfl:  .  traMADol (ULTRAM) 50 MG tablet, TAKE 2 TABLETS EVERY 4 HOURS AS NEEDED FOR COUGH, Disp: 180 tablet, Rfl: 0 .  VENTOLIN HFA 108 (90 Base) MCG/ACT inhaler, INHALE 2 PUFFS INTO THE LUNGS EVERY 4 HOURS AS NEEDED FOR WHEEZING OR SHORTNESS OF BREATH, Disp: 18 g, Rfl: 5 No current facility-administered medications for this visit.   Facility-Administered Medications Ordered in Other Visits:  .  goserelin (ZOLADEX) injection 3.6 mg, 3.6 mg, Subcutaneous, Once, Chauncey Cruel, MD  ALLERGIES: Allergies  Allergen Reactions  . Aspirin Shortness Of Breath and Palpitations    Pt can take ibuprofen without reaction  . Doxycycline Anaphylaxis  . Penicillins Shortness Of  Breath and Palpitations    Has patient had a PCN reaction causing immediate rash, facial/tongue/throat swelling, SOB or lightheadedness with hypotension: Yes Has patient had a PCN reaction causing severe rash involving mucus membranes or skin necrosis: Yes Has patient had a PCN reaction that required hospitalization Yes Has patient had a PCN reaction occurring within the last 10 years: No If all of the above answers are "NO", then may proceed with Cephalosporin use.  Severiano Gilbert Extract Itching and Swelling    Lip swelling  . Strawberry Extract Hives    FAMILY HISTORY: Family History  Problem Relation Age of Onset  . Diabetes Maternal Grandmother   . Heart disease Maternal Grandmother   . Hypertension Maternal Grandmother   . Cancer Neg Hx   . Alcohol abuse Neg Hx   . Early death Neg Hx   . Hyperlipidemia Neg Hx   . Kidney disease Neg Hx   . Stroke Neg Hx   . Colon cancer Neg Hx     SOCIAL HISTORY: Social History   Social History  . Marital status: Single    Spouse name: N/A  . Number of children: 3  . Years of education: N/A   Occupational History  .  Unemployed   Social History Main Topics  . Smoking status: Never Smoker  . Smokeless tobacco: Never Used  . Alcohol use 0.0 oz/week     Comment: occasionally  . Drug use: No  . Sexual activity: Yes    Birth control/ protection: Surgical   Other Topics Concern  . Not on file   Social History Narrative   She lives with four children.   She is currently not working (disbaility pending).          REVIEW OF SYSTEMS: Complete 10-system review is negative except for the following: SOB and vaginal bleeding as per HPI.  PHYSICAL EXAM: BP Marland Kitchen)  183/110 (BP Location: Right Arm, Patient Position: Sitting) Comment: informed nurse  Pulse 100   Temp 98.3 F (36.8 C) (Oral)   Resp 20   Ht _0  (1.676 m)   Wt 186 lb 3.2 oz (84.5 kg)   SpO2 100%   BMI 30.05 kg/m  General: Alert, oriented, no acute distress. HEENT:   Normocephalic, atraumatic.  Sclera anicteric, posterior oropharynx clear.  Normal dentition. Chest: Clear to auscultation bilaterally. Cardiovascular: Regular rate and rhythm, no murmurs, rubs, or gallops. Abdomen: Obese. Normoactive bowel sounds.  Soft, nondistended, nontender to palpation.  No masses or hepatosplenomegaly appreciated.  No evidence of hernia.  No palpable fluid wave.   Extremities: Grossly normal range of motion.  Warm, well perfused.  No edema bilaterally. Skin: No rashes or lesions. Lymphatics: No cervical, supraclavicular, or inguinal adenopathy. GU: External genitalia without lesions.  On speculum exam, normal appearing cervix. See EMB note below.  Bimanual exam reveals small mobile uterus with no palpable adnexal mass.  ENDOMETRIAL BIOPSY PROCEDURE Indication for this procedure is AUB.  A complete discussion of the endometrial biopsy procedure was held with the patient.  All questions were answered.  Consent was obtained.The patient was positioned in lithotomy.  A speculum was placed and the cervix was swabbed with Betadine. The Pipelle was placed to the uterine fundus which sounded to 7 cm.  2 passes of the biopsy instrument resulted in adequate tissue retrieval.  The speculum was removed.  The patient tolerated the procedure well.   LABORATORY AND RADIOLOGIC DATA: Medical records were reviewed to synthesize the above history, along with the history and physical obtained during the visit.  Laboratory, pathology, and imaging reports were reviewed, with pertinent results below.    ULTRASOUND 12/2014: FINDINGS: Uterus measurements: 8.1 x 4.1 x 4.2 cm. No fibroids.  Endometrium Thickness: 1.0 cm.  No focal abnormality visualized. Right ovary Measurements: The ovary is not visualized, either absent or obscured . No adnexal mass identified. Left ovary Measurements: 3.5 x 1.9 x 2.6 cm. Normal appearance/no adnexal mass. Other findings No free fluid.  IMPRESSION: 1. No  focal uterine abnormality. 2. Smooth normal thickness endometrium. 3. If bleeding remains unresponsive to hormonal or medical therapy, sonohysterogram should be considered for focal lesion work-up.  ASSESSMENT AND PLAN: SANAYAH MUNRO is a 40 y.o. woman with AUB in the setting of breast cancer and Zoladex injections.  The patient reports AUB since the birth of her son in 2013. She reports prior evaluation with [facility] but no EMB has been performed. She has had a tubal ligation and is not pregnant. EMB is performed today to assess for dysplasia or malignancy.   She has not yet had genetic testing for her triple negative breast cancer. We discussed the role of genetic testing to guide need for BSO and even hysterectomy in women with BRCA1 given potential increased risk of serous uterine cancer.   The patient desires hysterectomy, but we discussed that surgery includes risks and is not undertaken without a clear indication. We will follow up on the results of her biopsy and genetic testing to determine if hysterectomy is indicated in her case.   A copy of this note was sent to the patient's referring provider. I appreciate the opportunity to be involved in the care of this patient.  Marcello Fennel. Carlis Abbott, MD

## 2015-11-09 ENCOUNTER — Telehealth: Payer: Self-pay

## 2015-11-09 NOTE — Telephone Encounter (Signed)
Orders received from [facility] to contact the patient and update with the endometrium biopsy pathology report . " No cancer or abnormal cells seen. Patient also to be updated that the Genetic Counselor should be contacting her soon . Patient contacted and updated with results and that genetic's will be contacting her . Patient states understanding , denies further questions at this time .

## 2015-11-13 ENCOUNTER — Encounter (INDEPENDENT_AMBULATORY_CARE_PROVIDER_SITE_OTHER): Payer: Self-pay

## 2015-11-14 DIAGNOSIS — L732 Hidradenitis suppurativa: Secondary | ICD-10-CM | POA: Diagnosis not present

## 2015-11-17 ENCOUNTER — Encounter: Payer: Self-pay | Admitting: Internal Medicine

## 2015-11-17 ENCOUNTER — Ambulatory Visit (INDEPENDENT_AMBULATORY_CARE_PROVIDER_SITE_OTHER): Payer: Medicare Other | Admitting: Internal Medicine

## 2015-11-17 ENCOUNTER — Other Ambulatory Visit (INDEPENDENT_AMBULATORY_CARE_PROVIDER_SITE_OTHER): Payer: Medicare Other

## 2015-11-17 VITALS — BP 150/90 | HR 118 | Ht 66.0 in | Wt 192.4 lb

## 2015-11-17 DIAGNOSIS — R05 Cough: Secondary | ICD-10-CM | POA: Diagnosis not present

## 2015-11-17 DIAGNOSIS — R058 Other specified cough: Secondary | ICD-10-CM

## 2015-11-17 DIAGNOSIS — J45991 Cough variant asthma: Secondary | ICD-10-CM | POA: Diagnosis not present

## 2015-11-17 LAB — CBC WITH DIFFERENTIAL/PLATELET
BASOS ABS: 0 10*3/uL (ref 0.0–0.1)
BASOS PCT: 0.4 % (ref 0.0–3.0)
Eosinophils Absolute: 0.1 10*3/uL (ref 0.0–0.7)
Eosinophils Relative: 1.7 % (ref 0.0–5.0)
HCT: 41.3 % (ref 36.0–46.0)
Hemoglobin: 13.8 g/dL (ref 12.0–15.0)
LYMPHS ABS: 1.9 10*3/uL (ref 0.7–4.0)
Lymphocytes Relative: 29.5 % (ref 12.0–46.0)
MCHC: 33.5 g/dL (ref 30.0–36.0)
MCV: 87 fl (ref 78.0–100.0)
MONOS PCT: 7.2 % (ref 3.0–12.0)
Monocytes Absolute: 0.5 10*3/uL (ref 0.1–1.0)
NEUTROS ABS: 3.9 10*3/uL (ref 1.4–7.7)
NEUTROS PCT: 61.2 % (ref 43.0–77.0)
PLATELETS: 186 10*3/uL (ref 150.0–400.0)
RBC: 4.74 Mil/uL (ref 3.87–5.11)
RDW: 14.4 % (ref 11.5–15.5)
WBC: 6.4 10*3/uL (ref 4.0–10.5)

## 2015-11-17 LAB — NITRIC OXIDE: Nitric Oxide: 8

## 2015-11-17 NOTE — Progress Notes (Signed)
Subjective:    Patient ID: [name], female     DOB: 1975/09/10    MRN: [id number]    Brief patient profile:  34 yobf with asthma all her life much worse since around 07/2010 referred by Dr [name] to pulmonary clinic in Sept 2012 Hx of Breast Cancer 05/2008    History of Present Illness  12/18/2010 Initial pulmonary office eval on acei and  advair cc dtc asthma x 4 months but baseline = excess saba use "even when better" at least twice daily despite daily advair, worse at hs in fact  used saba 3 x last night.  On chemo /RT for breast ca but last rx was Jan 2012 well before asthma worse.  To er multiple times >  no better with singular added x 3 months. rec Stop Lisinopril Dulera 200 Take 2 puffs first thing in am and then another 2 puffs about 12 hours later.  Work on inhaler technique  Nexium 40 mg   Take 30-60 min before first meal of the day and Pepcid 20 mg one bedtime until return Only use your albuterol as a rescue medication to be used if you can't catch your breath by resting or doing a relaxed purse lip breathing pattern. The less you use it, the better it will work when you need it.  Prednisone 10 mg take  4 each am x 2 days,   2 each am x 2 days,  1 each am x2days and stop    01/03/2011 f/u ov/Erica [name] cc much better off ace and advair but still needing saba twice daily on avg. No  Purulent sputum. rec no change in rx/ rule of 2's reviewed     04/06/2013 NP Follow up and Med Review   We reviewed all her meds and organized them into a med calendar. She does have Medicaid but depends on samples. Did not bring all her meds with her today , says they are at home.  Says her breathing is the same since last ov w/ SOB, Has on /off wheezing, some chest heaviness, prod cough with yellow mucus. Good and bad days. Wears out easily .  Denies f/c/s, nausea, vomiting.  Did not stop singulair as recommended.  rec  Follow med calendar closely and bring to each visit > did not do  We are  referring you for a CPSTwith spirometry before and after> never done   04/18/2014 NP Follow up and Med Review / NP  Patient returns for a two-week follow-up and medication review. We reviewed all her medications organize them into a medication count with patient education It appears that she is taking her medications correctly, however, she has over 30 medications and has had several changes recently. Last visit, her [name] was decreased to the 162mcg says that her breathing was better on the 210mcg Patient had a chest x-ray last visit that showed no acute changes She underwent a CT sinus due to persistent cough , which was positive for sinusitis on the right She was treated with a ten-day course of Avelox, she has one dose left Does feel some better but still has cough . She denies any hemoptysis, orthopnea, PND or leg swelling  Patient has upcoming sleep study. Next week for suspected underlying sleep apnea with snoring and daytime sleepiness rec Refer to ENT for sinusitis and chronic cough > never saw one> f/u sinus CT neg 07/26/14  Increase Dulera 200 2 puffs Twice daily   Follow med calendar closely  and bring to each visit.     08/01/2014 f/u ov/Erica [name] re:  Asthma plus noct "gasping"  Chief Complaint  Patient presents with  . Follow-up    doing well.  no complaints.  not doing well x one year with noct / early  4am with cough/ gasping for air almost always goes to neb and then can't go back to sleep even 30 min later better Daytime in chest tightness resolves  completely while on prednisone last took one month prior to OV  And doesn't have it now but has exact same noct symptoms and overusing saba  Doe x mailbox and back to house out of breath and coughing  >>change dulera 200 to 100   08/30/2014 NP Follow up and Med review : Asthma Patient presents for a one-month follow-up We reviewed all her medications organize them into a medication count with patient education Patient has had  multiple medication changes recently She has been having difficulty with resistant hypertension and visual changes. Recently started on primidone and spironolactone . However, has not started this as of yet That she needs to go pick this up at the pharmacy. Neuro notes reviewed , has been referred to NS . B/p is elevated again at todays visit, discussed follow up with PCP for b/p management.  She tried to decrease her Dulera as recommended from last ov with Dr. Melvyn Novas    however, was unable to and is back on 200 dose .  Says overall her breathing is doing ok  No flare of cough or wheezing  Does have a lot of drainage in throat . Discussed starting on chlortrimeton.  rec Continue Dulera 200 2 puffs Twice daily  , rinse after use  Follow med calendar closely and bring to each visit.  Follow up with Primary MD concerning elevated blood pressure    10/05/2014 f/u ov/Erica [name] re: asthma with component of uacs/ no med calendar  Chief Complaint  Patient presents with  . Follow-up    Here to discuss meds- Dulera not on her drug formulary. Pt states breathing is doing well and denies any new co's today.  turns out using neb at least once every 24 h, esp between 2 and 4 am when wakes up with severe dry cough x months/ pred helps some but never eliminated noct cough rec symbicort 160 Take 2 puffs first thing in am and then another 2 puffs about 12 hours later.  Try taking the tramadol around 2 am to see if helps with the early am cough/ wheeze  See calendar for specific medication instructions and bring it back for each and every office visit  Separating the top medications from the bottom group is fundamental to providing you adequate care going forward.   Please schedule a follow up office visit in 2 weeks, sooner if needed with all medications in 2 separate bags    10/19/2014 f/u ov/Erica [name] re:  Asthma vs uacs/ not using med calendar correclty  Chief Complaint  Patient presents with  . Follow-up     Cough has changed sicnce starting symbicort- prod cough with "dirty" sputum. She states she feels that she can tell when her PM dose is due b/c she starts feeling SOB. She is using proair once per day on average.   not using symbicort 160 correctly  rec Prednisone 10 mg take  4 each am x 2 days,  2 each am x 2 days,  1 each am x 2 days and stop  Bystolic  10 mg take twice daily until return  Take 2 chlortrimeton at bedtime and 2 more 4 hours later  Continue symbicort 160 Take 2 puffs first thing in am and then another 2 puffs about 12 hours later.  Only use your albuterol as rescue  Only use nebulizer if you try proventil first and it fails to work    11/08/2014 Follow up : Asthma /UAC   Pt returns for follow up .  Last ov with asthma flare , tx with pred taper  She is feeling better Breathing is doing better and back to her baseline  Still has dry cough but some better.  We reviewed all her meds and organized them into a med calendar with pt education  Appears to be taking correctly although she is on over 30 medications .  B/p is improved on current regimen.  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 Follow-up    C/o increase SOB, prod cough (tan color phlem), wheezing, chest tx, nasal cong  worse x one week but at baseline doe x more than slow walk and neb q day around 4 am as the hfa not helping with noct symptoms no better even on prednisone rec Prednisone 10 mg take  4 each am x 2 days,  2 each am x 2 days,  1 each am x 2 days and stop      03/24/2015 f/u  Pulmonary office visit/ Erica [name]  Asthma vs vcd Chief Complaint  Patient presents with  . Follow-up    Pt c/o increased cough with tan mucus, wheeze and SOB. Pt reports increase in symptoms with weather changes. Pt has used albuterol HFA with some symptom relief. Pt denies f/n/v/d.   2 weeks prior to OV  Noted needing the noct neb again  where this problem had resolved on pred  then 2 d after that bad cough p [employer] shopping and worse since then but still comfortable p saba rx rec Prednisone 10 mg take  4 each am x 2 days,   2 each am x 2 days,  1 each am x 2 days and stop  For cough use mucinex dm up to 1200 mg every 12 hours and the flutter valve as much as possible and if still can't control the cough > tramadol as per action plan on med calendar    06/29/2015  f/u ov/Erica [name] re: asthma and unrelated cough / no med calendar  Chief Complaint  Patient presents with  . Follow-up    Cough has not improved since she was last seen.  She is using albuterol inhaler 2 x daily and neb with albuterol 1 x daily on average.   MCT did not reproduce the cough but the sob and wheeze  Unless w/in two weeks of taking prednisone, resumes 4 am wheezing/ gasping for air> almost every night needs to back up proair with neb  On dex daily taken before supper  avg use of tramadol is 6 x in 24 h unless on pred  Prednisone 10 mg take  4 each am x 2 days,   2 each am x 2 days,  1 each am x 2 days and stop  Qvar 80 Take 2 puffs first thing in am and then another 2 puffs about 12 hours later.  change dexilant Take 30-60 min before first meal of the day and Add Pepcid 20 mg  at bedtime  For drainage / throat tickle try take CHLORPHENIRAMINE  4 mg - take one every 4 hours as needed - available over the counter- may cause drowsiness so start with just a bedtime dose or two and see how you tolerate it before trying in daytime   See Tammy NP w/in 2 weeks(or next available after than)  with all your medications,  At f/u (given one sample = 120 pffs)     07/18/2015 NP  Follow up : Asthma vs Cough  Pt returns for 2 week follow up . Seen last ov with Asthma flare , tx w/ steroid taper.  She is feeling better. Still has some lingering cough on/off.  QVAR was added to her symbicort .  We reviewed all her meds and organized them into a med calendar with pt  education.  Appears to be taking correctly rec Follow med calendar    08/31/2015 acute extended ov/Erica [name] re: asthma flare on maint rx with symb 160/qvar 80 2bid Chief Complaint  Patient presents with  . Acute Visit    Pt c/o wheezing, increased SOB, chest tightness and cough with white sputum x 2 days.   baseline still needed albuterol with activity regardless of time of day  Before was using neb nightly but that improved p last ov then p  cleaning out storage shed >  Tongue felt full /sob wheezing > 2 ER Trips 08/19/15  > benadryl/ pepcid no pred >  08/29/15 returned to ER > mgs02/ prednisone at 40 mg per day/ only used saba hfa today / cough less of a problem with this flare  rec Prednisone 40 mg x 4 days and 20 mg x 4 days and 10 mg daily x 4 days and off Add pepcid 20 mg one at bedtime  For cough > mucinex dm up to 1200 mg every 12 hours, supplement with tramadol and use the flutter valve as much as possible    11/17/2015  f/u ov/Erica [name] re: 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 Follow-up    pt c/o prod cough with beige mucus, sob with exertion.     just using albuterol about half way thru each day for sob  Still wakes every morning around 4 am coughing unless actively taking prednisone and helps x 2 weeks then gradually worse again. No neb saba now  Needed    No obvious  Patterns  in day to day or daytime variability or assoc excess/ purulent sputum or mucus plugs or hemoptysis or cp or chest tightness,  or overt sinus or hb symptoms. No unusual exp hx or h/o childhood pna/ asthma or knowledge of premature birth.  Sleeping ok without nocturnal  or early am exacerbation  of respiratory  c/o's or need for noct saba. Also denies any obvious fluctuation of symptoms with weather or environmental changes or other aggravating or alleviating factors except as outlined above   Current Medications,  Allergies, Complete Past Medical History, Past Surgical History, Family History, and Social History were reviewed in [employer] record.  ROS  The following are not active complaints unless bolded sore throat, dysphagia, dental problems, itching, sneezing,  nasal congestion or excess/ purulent secretions, ear ache,   fever, chills, sweats, unintended wt loss, classically pleuritic or exertional cp,  orthopnea pnd or leg swelling, presyncope, palpitations, abdominal pain, anorexia, nausea, vomiting, diarrhea  or  change in bowel or bladder habits, change in stools or urine, dysuria,hematuria,  rash, arthralgias, visual complaints, headache, numbness, weakness or ataxia or problems with walking or coordination,  change in mood/affect or memory.                   Objective:   Physical Exam  Obese bf nad  vital signs reviewed         Wt 213  12/18/10 > 01/03/2011  214 > 01/31/2011  206 >  09/11/2012 209 >208 6/19 > 210 09/25/2012 > 11/20/2012 211 >208 12/07/12> 01/12/2013 209 > 02/09/2013 210 >207 04/06/2013 > 07/14/2013  213>212 07/28/2013 >204 01/26/2014> 04/04/2014 206  >205 04/18/2014 >  07/18/2014 201 >  08/01/14 201 >198 08/30/2014 > 10/05/14  195 > 10/19/2014 192 > 191 11/08/2014 > 02/10/2015  198 > 03/24/2015   198 > 06/29/2015   201 > 08/31/2015  204 > 11/17/2015  197   HEENT: nl dentition, turbinates, and orophanx. Nl external ear canals without cough reflex,     NECK :  without JVD/Nodes/TM/ nl carotid upstrokes bilaterally   LUNGS: lungs clear bilaterally to A and P x mild pseudowheeze   CV:  RRR  no s3 or murmur or increase in P2, no edema   ABD:  soft and nontender with nl excursion in the supine position. No bruits or organomegaly, bowel sounds nl  MS:  warm without deformities, calf tenderness, cyanosis or clubbing        I personally reviewed images and agree with radiology impression as follows:  CXR:  08/29/15 No evidence of acute cardiopulmonary disease.

## 2015-11-17 NOTE — Patient Instructions (Addendum)
For drainage / throat tickle>  try take CHLORPHENIRAMINE  4 mg - take one every 4 hours as needed - - always take 2 at bedtime  For cough > mucinex dm is 1200 mg every 12 hours as the flutter as much as possible and the tramadol only as a backup   Please remember to go to the lab  department downstairs for your tests - we will call you with the results when they are available.     Please schedule a follow up office visit in 4 weeks, sooner if needed with your med calendar and all medications

## 2015-11-19 ENCOUNTER — Encounter: Payer: Self-pay | Admitting: Internal Medicine

## 2015-11-19 NOTE — Assessment & Plan Note (Signed)
MRI 05/18/15 > Paranasal sinuses are clear - 11/17/2015 rec increase h1 to 2 hs to help with noct cough  - Allergy profile 11/17/2015 >  Eos 0. /  IgE    Also needs to remember the mucienx dm max dose and use the flutter valve

## 2015-11-19 NOTE — Assessment & Plan Note (Addendum)
-   PFT's 01/31/2011 FEV1  1.76 (60% ) ratio 68 and no better p B2,  DLCO 70% -med review 09/25/2012 > Dulera drug assistance  paperwork given    - HFA 90% 11/20/2012 .  -Med calendar 12/07/12 , 04/06/2013 , 04/18/2014,, 08/30/2014 , 11/08/2014  - PFT's 01/12/2013  1.73 (65%)  Ratio 73 and no better p B2, DLCO 86% - trial off singulair 02/08/13 >>>did not stop  - 04/05/2014  trial of dulera 100 instead of 200 > worse  -04/18/2014 increase Dulera 200  - 08/01/2014 p extensive coaching HFA effectiveness =    90% - 08/01/2014  Walked RA x 2laps @ 185 ft each stopped due to cough > sob/ no desat moderate pace  - spirometry 08/01/2014  FEV1  1.87 (70%) ratio 78  fef 25-75 53%  - spirometry 02/10/2015  flattening of top portion of f/v loop only    - 02/10/2015   Walked RA  2 laps @ 185 ft each stopped due to cough > sob/ slow pace   -  NO 03/24/2015   =  16 - Methacholine challenge 04/04/2015   POS asthma = wheeze/sob but no coughing fits > added qvar 80 2bid 06/29/2015 >>>   -flare sarting Aug 19 2015 p exp to storage shed     - Spirometry 08/31/2015  FEV1 0.75 (28%)  Ratio 68/ could not do FENO  - FENO 11/17/2015  =  8   - The proper method of use, as well as anticipated side effects, of a metered-dose inhaler are discussed and demonstrated to the patient. Improved effectiveness after extensive coaching during this visit to a level of approximately 90% from a baseline of 75 %       Very difficult to believe there is still a systemic steroid responsive component to her airways dz that isn't being adequately addressed by symb/qvar topically with reasonable hfa so no change in rx needed  However, note that again she has forgotten her med calendar and doubt she's as compliant as I had hoped on such a complex regimen - advised to use it daily and bring to each ov for optimal med reconcilation so we can use a trust but verify approach here.   I had an extended discussion with the patient reviewing all relevant studies  completed to date and  lasting 15 to 20 minutes of a 25 minute visit    Each maintenance medication was reviewed in detail including most importantly the difference between maintenance and prns and under what circumstances the prns are to be triggered using an action plan format that is not reflected in the computer generated alphabetically organized AVS.    Please see instructions for details which were reviewed in writing and the patient given a copy highlighting the part that I personally wrote and discussed at today's ov.

## 2015-11-20 LAB — RESPIRATORY ALLERGY PROFILE REGION II ~~LOC~~
ALLERGEN, CEDAR TREE, T6: 0.94 kU/L — AB
Allergen, C. Herbarum, M2: <0.1 kU/L
Allergen, Comm Silver Birch, t9: <0.1 kU/L
Allergen, Cottonwood, t14: <0.1 kU/L
Allergen, D pternoyssinus,d7: <0.1 kU/L
Allergen, Mulberry, t76: <0.1 kU/L
Aspergillus fumigatus, m3: <0.1 kU/L
Bermuda Grass: 0.22 kU/L — ABNORMAL HIGH
COMMON RAGWEED: 0.12 kU/L — AB
Cockroach: 0.11 kU/L — ABNORMAL HIGH
Dog Dander: <0.1 kU/L
Elm IgE: <0.1 kU/L
IgE (Immunoglobulin E), Serum: 80 kU/L (ref <115)
JOHNSON GRASS: 0.28 kU/L — AB
Pecan/Hickory Tree IgE: <0.1 kU/L
ROUGH PIGWEED IGE: 0.16 kU/L — AB
Timothy Grass: 0.51 kU/L — ABNORMAL HIGH

## 2015-11-20 NOTE — Progress Notes (Signed)
LMTCB

## 2015-11-23 ENCOUNTER — Ambulatory Visit: Payer: Medicare Other

## 2015-11-23 ENCOUNTER — Telehealth: Payer: Self-pay | Admitting: Oncology

## 2015-11-23 ENCOUNTER — Telehealth: Payer: Self-pay | Admitting: Internal Medicine

## 2015-11-23 ENCOUNTER — Ambulatory Visit: Payer: Medicare Other | Admitting: Oncology

## 2015-11-23 NOTE — Progress Notes (Signed)
lmtcb for pt.  

## 2015-11-23 NOTE — Telephone Encounter (Signed)
Notes Recorded by Tanda Rockers, MD on 11/20/2015 at 4:47 PM EDT Call patient : Studies are C/w mild allergies to grass, ragweed, cedar tree    LMTCB X1

## 2015-11-23 NOTE — Telephone Encounter (Signed)
Patient called to r/s 8/24 inj to 8/29. Patient has new date/time.

## 2015-11-24 ENCOUNTER — Encounter: Payer: Self-pay | Admitting: Internal Medicine

## 2015-11-24 NOTE — Telephone Encounter (Signed)
lmtcb x2 for pt. 

## 2015-11-27 DIAGNOSIS — G932 Benign intracranial hypertension: Secondary | ICD-10-CM | POA: Diagnosis not present

## 2015-11-27 NOTE — Telephone Encounter (Signed)
Called and spoke with pt and she is aware of lab results per MW.

## 2015-11-27 NOTE — Telephone Encounter (Signed)
Should I let patient know that another office visit is needed. Its been a month since rx for Odansetron was sent.

## 2015-11-28 ENCOUNTER — Ambulatory Visit (HOSPITAL_BASED_OUTPATIENT_CLINIC_OR_DEPARTMENT_OTHER): Payer: Medicare Other

## 2015-11-28 ENCOUNTER — Other Ambulatory Visit: Payer: Self-pay | Admitting: *Deleted

## 2015-11-28 ENCOUNTER — Telehealth: Payer: Self-pay | Admitting: *Deleted

## 2015-11-28 VITALS — BP 195/103 | HR 91 | Temp 97.7°F | Resp 16

## 2015-11-28 DIAGNOSIS — C50412 Malignant neoplasm of upper-outer quadrant of left female breast: Secondary | ICD-10-CM

## 2015-11-28 DIAGNOSIS — Z5111 Encounter for antineoplastic chemotherapy: Secondary | ICD-10-CM | POA: Diagnosis present

## 2015-11-28 DIAGNOSIS — I10 Essential (primary) hypertension: Secondary | ICD-10-CM

## 2015-11-28 MED ORDER — FUROSEMIDE 20 MG PO TABS
20.0000 mg | ORAL_TABLET | Freq: Once | ORAL | Status: AC
  Administered 2015-11-28: 20 mg via ORAL

## 2015-11-28 MED ORDER — GOSERELIN ACETATE 3.6 MG ~~LOC~~ IMPL
3.6000 mg | DRUG_IMPLANT | Freq: Once | SUBCUTANEOUS | Status: AC
  Administered 2015-11-28: 3.6 mg via SUBCUTANEOUS
  Filled 2015-11-28: qty 3.6

## 2015-11-28 NOTE — Telephone Encounter (Signed)
This RN was contacted by RN in injection room per pt here for zoladex injection- noted pressures are 190/100 and 195/103.  Pt reports she has taken prescribed medications this am- she does state increased stress due to recent death of her grand mother.  Per MD - order given to proceed with zoladex injection as well as lasix 20 mg po now ( while in the office ).  Pt is to contact primary MD regarding BP for further recommendations.  Above stated to inj room nurse with verbalized understanding.

## 2015-12-05 ENCOUNTER — Other Ambulatory Visit (INDEPENDENT_AMBULATORY_CARE_PROVIDER_SITE_OTHER): Payer: Medicare Other

## 2015-12-05 ENCOUNTER — Encounter: Payer: Self-pay | Admitting: Internal Medicine

## 2015-12-05 ENCOUNTER — Ambulatory Visit (INDEPENDENT_AMBULATORY_CARE_PROVIDER_SITE_OTHER): Payer: Medicare Other | Admitting: Internal Medicine

## 2015-12-05 VITALS — BP 190/100 | HR 110 | Temp 98.9°F | Resp 16 | Ht 66.0 in | Wt 191.5 lb

## 2015-12-05 DIAGNOSIS — E876 Hypokalemia: Secondary | ICD-10-CM

## 2015-12-05 DIAGNOSIS — Z23 Encounter for immunization: Secondary | ICD-10-CM

## 2015-12-05 DIAGNOSIS — I10 Essential (primary) hypertension: Secondary | ICD-10-CM | POA: Diagnosis not present

## 2015-12-05 LAB — BASIC METABOLIC PANEL
BUN: 12 mg/dL (ref 6–23)
CALCIUM: 9.4 mg/dL (ref 8.4–10.5)
CHLORIDE: 102 meq/L (ref 96–112)
CO2: 35 meq/L — AB (ref 19–32)
CREATININE: 1.08 mg/dL (ref 0.40–1.20)
GFR: 71.48 mL/min (ref >60.00)
Glucose, Bld: 78 mg/dL (ref 70–99)
Potassium: 3.3 mEq/L — ABNORMAL LOW (ref 3.5–5.1)
SODIUM: 141 meq/L (ref 135–145)

## 2015-12-05 LAB — MAGNESIUM: MAGNESIUM: 1.9 mg/dL (ref 1.5–2.5)

## 2015-12-05 MED ORDER — AZILSARTAN-CHLORTHALIDONE 40-25 MG PO TABS: 1.0000 | ORAL_TABLET | Freq: Every day | ORAL | 0 refills | Status: DC

## 2015-12-05 NOTE — Progress Notes (Signed)
Subjective:  Patient ID: Erica [name], female    DOB: 1975/07/10  Age: 40 y.o. MRN: [id number]  CC: Hypertension   HPI Erica [name] presents for a BP check.  She tells me her blood pressure has not been well controlled. Fortunately, she has no symptoms such as headache/blurred vision/chest pain/shortness of breath/edema/palpitations/or fatigue. Her current medication list about 8 antihypertensives about 4 or which are diuretics. She is receiving Diamox from her neurologist for pseudotumor cerebri. It looks like she saw a cardiologist a few months ago and a loop diuretic was added to the thiazide diuretics. She cannot state clearly that she is taking all of these. It also sounds like she suffers from noncompliance. She is a poor historian.  Outpatient Medications Prior to Visit  Medication Sig Dispense Refill  . acetaZOLAMIDE (DIAMOX) 250 MG tablet Take 4 tablets (1,000 mg total) by mouth 3 (three) times daily. 400 tablet 11  . albuterol (PROVENTIL) (2.5 MG/3ML) 0.083% nebulizer solution USE 1 VIAL VIA NEBULIZER EVERY 4 HOURS AS NEEDED( NEED APPOINTMENT) 300 mL 2  . amitriptyline (ELAVIL) 100 MG tablet Take 1 tablet (100 mg total) by mouth at bedtime. 90 tablet 1  . amLODipine (NORVASC) 10 MG tablet TAKE 1 TABLET(10 MG) BY MOUTH DAILY 90 tablet 0  . beclomethasone (QVAR) 80 MCG/ACT inhaler Inhale 2 puffs into the lungs 2 (two) times daily. 1 Inhaler 5  . Biotin 10 MG TABS Take 1 tablet by mouth every morning.     . chlorpheniramine (CHLOR-TRIMETON) 4 MG tablet Take 8 mg by mouth at bedtime.    . cloNIDine (CATAPRES) 0.3 MG tablet TAKE 1 TABLET BY MOUTH TWICE DAILY 180 tablet 3  . dexlansoprazole (DEXILANT) 60 MG capsule Take 60 mg by mouth daily before breakfast.     . dextromethorphan-guaiFENesin (MUCINEX DM) 30-600 MG per 12 hr tablet Take 1 tablet by mouth 2 (two) times daily.     Marland Kitchen diltiazem (CARDIZEM LA) 420 MG 24 hr tablet Take 1 tablet (420 mg total) by mouth daily. 90 tablet 3  .  DULoxetine (CYMBALTA) 60 MG capsule Take 1 capsule (60 mg total) by mouth daily. 90 capsule 3  . EPINEPHrine 0.3 mg/0.3 mL IJ SOAJ injection Inject 0.3 mLs (0.3 mg total) into the muscle once as needed (anaphylaxis reaction). 2 Device 5  . Eszopiclone 3 MG TABS Take 1 tablet (3 mg total) by mouth at bedtime as needed. 30 tablet 3  . fluocinonide-emollient (LIDEX-E) 0.05 % cream Apply 1 application topically 2 (two) times daily. 60 g 1  . hydrALAZINE (APRESOLINE) 100 MG tablet Take 1 tablet (100 mg total) by mouth 3 (three) times daily. 90 tablet 11  . hyoscyamine (LEVSIN SL) 0.125 MG SL tablet Place under the tongue.    . isosorbide mononitrate (IMDUR) 60 MG 24 hr tablet Take 60 mg by mouth every morning.    . lidocaine (XYLOCAINE) 5 % ointment Apply 1 application topically 2 (two) times daily as needed. 50 g 5  . LORazepam (ATIVAN) 0.5 MG tablet TAKE ONE TABLET BY MOUTH EVERY 8 HOURS AS NEEDED FOR ANXIETY 90 tablet 0  . LYRICA 200 MG capsule TAKE ONE CAPSULE BY MOUTH THREE TIMES DAILY 90 capsule 0  . metoCLOPramide (REGLAN) 10 MG tablet Take 10 mg by mouth 4 (four) times daily.    . mometasone (NASONEX) 50 MCG/ACT nasal spray Place 2 sprays into the nose 2 (two) times daily. 17 g 5  . montelukast (SINGULAIR) 10 MG tablet TAKE  1 TABLET BY MOUTH EVERY NIGHT AT BEDTIME 90 tablet 1  . Nebivolol HCl (BYSTOLIC) 20 MG TABS 2 tabs by mouth per day 180 tablet 3  . NITROSTAT 0.4 MG SL tablet PLACE 1 TABLET UNDER TONGUE EVERY 5 MINUTES AS NEEDED FOR CHEST PAIN, MAX OF 3 TABLETS THEN CALL 911 IF PAIN PERSISTS 100 tablet 0  . ondansetron (ZOFRAN-ODT) 4 MG disintegrating tablet DISSOLVE 1 TABLET(4 MG) ON THE TONGUE EVERY 8 HOURS AS NEEDED FOR NAUSEA OR VOMITING 20 tablet 3  . OXYGEN Use CPAP at bedtime    . primidone (MYSOLINE) 50 MG tablet Take by mouth.    . Respiratory Therapy Supplies (FLUTTER) DEVI Use as directed 1 each 0  . spironolactone (ALDACTONE) 25 MG tablet Take 1 tablet (25 mg total) by mouth  daily. 90 tablet 3  . SYMBICORT 160-4.5 MCG/ACT inhaler INHALE 2 PUFFS BY MOUTH TWICE DAILY 10.2 g 5  . topiramate (TOPAMAX) 25 MG tablet Take 25 mg by mouth 2 (two) times daily.    . traMADol (ULTRAM) 50 MG tablet TAKE 2 TABLETS EVERY 4 HOURS AS NEEDED FOR COUGH 180 tablet 0  . VENTOLIN HFA 108 (90 Base) MCG/ACT inhaler INHALE 2 PUFFS INTO THE LUNGS EVERY 4 HOURS AS NEEDED FOR WHEEZING OR SHORTNESS OF BREATH 18 g 5  . furosemide (LASIX) 40 MG tablet TK 1 T PO D PRN  9  . hydrochlorothiazide (MICROZIDE) 12.5 MG capsule   1  . potassium chloride SA (K-DUR,KLOR-CON) 20 MEQ tablet TAKE 1 TABLET BY MOUTH ON DAY YOU TAKE LASIX 30 tablet 10   Facility-Administered Medications Prior to Visit  Medication Dose Route Frequency Provider Last Rate Last Dose  . goserelin (ZOLADEX) injection 3.6 mg  3.6 mg Subcutaneous Once Chauncey Cruel, MD        ROS Review of Systems  Constitutional: Negative.  Negative for activity change, appetite change, diaphoresis and fatigue.  HENT: Negative.  Negative for trouble swallowing.   Eyes: Negative.  Negative for photophobia and visual disturbance.  Cardiovascular: Negative.  Negative for chest pain, palpitations and leg swelling.  Gastrointestinal: Negative.  Negative for abdominal pain, diarrhea, nausea and vomiting.  Endocrine: Negative.   Genitourinary: Negative.  Negative for decreased urine volume, difficulty urinating, dysuria, frequency, hematuria and urgency.  Musculoskeletal: Negative.  Negative for arthralgias, back pain, myalgias and neck pain.  Skin: Negative.  Negative for color change and rash.  Allergic/Immunologic: Negative.   Neurological: Negative.  Negative for dizziness, tremors, weakness, numbness and headaches.  Hematological: Negative.  Negative for adenopathy. Does not bruise/bleed easily.  Psychiatric/Behavioral: Negative.     Objective:  BP (!) 190/100 (BP Location: Right Arm, Patient Position: Sitting, Cuff Size: Normal)   Pulse  (!) 110   Temp 98.9 F (37.2 C) (Oral)   Resp 16   Ht 5\' 6"  (1.676 m)   Wt 191 lb 8 oz (86.9 kg)   SpO2 97%   BMI 30.91 kg/m   BP Readings from Last 3 Encounters:  12/05/15 (!) 190/100  11/28/15 (!) 195/103  11/17/15 (!) 150/90    Wt Readings from Last 3 Encounters:  12/05/15 191 lb 8 oz (86.9 kg)  11/17/15 192 lb 6.4 oz (87.3 kg)  11/08/15 186 lb 3.2 oz (84.5 kg)    Physical Exam  Constitutional: She is oriented to person, place, and time.  HENT:  Mouth/Throat: Oropharynx is clear and moist. No oropharyngeal exudate.  Eyes: Conjunctivae are normal. Right eye exhibits no discharge. Left eye exhibits  no discharge. No scleral icterus.  Neck: Normal range of motion. Neck supple. No JVD present. No tracheal deviation present. No thyromegaly present.  Cardiovascular: Normal rate, regular rhythm and intact distal pulses.  Exam reveals no gallop and no friction rub.   No murmur heard. Pulmonary/Chest: Effort normal and breath sounds normal. No respiratory distress. She has no wheezes. She has no rales. She exhibits no tenderness.  Abdominal: Soft. Bowel sounds are normal. She exhibits no distension and no mass. There is no tenderness. There is no rebound and no guarding.  Musculoskeletal: Normal range of motion. She exhibits no edema, tenderness or deformity.  Lymphadenopathy:    She has no cervical adenopathy.  Neurological: She is oriented to person, place, and time.  Skin: Skin is warm and dry. No rash noted. She is not diaphoretic. No erythema. No pallor.  Vitals reviewed.   Lab Results  Component Value Date   WBC 6.4 11/17/2015   HGB 13.8 11/17/2015   HCT 41.3 11/17/2015   PLT 186.0 11/17/2015   GLUCOSE 78 12/05/2015   CHOL 206 (H) 06/15/2015   TRIG 110.0 06/15/2015   HDL 44.10 06/15/2015   LDLCALC 140 (H) 06/15/2015   ALT 27 09/20/2014   AST 28 09/20/2014   NA 141 12/05/2015   K 3.3 (L) 12/05/2015   CL 102 12/05/2015   CREATININE 1.08 12/05/2015   BUN 12  12/05/2015   CO2 35 (H) 12/05/2015   TSH 1.06 06/15/2015   INR 1.1 (H) 06/03/2012   HGBA1C 5.6 08/22/2015    Mm Diag Breast Tomo Bilateral  Result Date: 11/06/2015 CLINICAL DATA:  Left lumpectomy.  Annual mammography. EXAM: 2D DIGITAL DIAGNOSTIC BILATERAL MAMMOGRAM WITH CAD AND ADJUNCT TOMO COMPARISON:  Previous exam(s). ACR Breast Density Category b: There are scattered areas of fibroglandular density. FINDINGS: Stable lumpectomy changes on the left. No suspicious abnormalities to suggest malignancy in either breast. Mammographic images were processed with CAD. IMPRESSION: No mammographic evidence of malignancy. RECOMMENDATION: Annual diagnostic mammography I have discussed the findings and recommendations with the patient. Results were also provided in writing at the conclusion of the visit. If applicable, a reminder letter will be sent to the patient regarding the next appointment. BI-RADS CATEGORY  2: Benign. Electronically Signed   By: Dorise Bullion III M.D   On: 11/06/2015 13:43    Assessment & Plan:   Rickeya was seen today for hypertension.  Diagnoses and all orders for this visit:  Essential hypertension- her blood pressure is not adequately well controlled, I am concerned that she suffers from noncompliance and apathy about controlling her blood pressure. She has not been working on her lifestyle modifications. I have consolidated her diuretic therapy and think that she should only be taking Diamox and a thiazide, not an additional loop diuretic. I've asked her to be more compliant with the other antihypertensives as well. I also think she should add on an ARB in the event that she may be developing hyperreninism from the diuretic therapy. Her potassium level remains low so will supplement her low potassium level as well. -     Azilsartan-Chlorthalidone (EDARBYCLOR) 40-25 MG TABS; Take 1 tablet by mouth daily. -     Basic metabolic panel; Future -     Magnesium; Future -     potassium  chloride SA (K-DUR,KLOR-CON) 20 MEQ tablet; Take 1 tablet (20 mEq total) by mouth 3 (three) times daily.  Hypokalemia- this is caused by the diuretics as well as poor by mouth intake, I've  asked her to restart potassium replacement therapy. -     Basic metabolic panel; Future -     Magnesium; Future -     potassium chloride SA (K-DUR,KLOR-CON) 20 MEQ tablet; Take 1 tablet (20 mEq total) by mouth 3 (three) times daily.  Morbid obesity due to excess calories ([place])- I've asked her to work on her lifestyle modifications to lower her weight and to decrease her blood pressures well.  Need for prophylactic vaccination and inoculation against influenza -     Flu Vaccine QUAD 36+ mos IM   I have discontinued Ms. Birkel's furosemide and hydrochlorothiazide. I have also changed her potassium chloride SA. Additionally, I am having her start on Azilsartan-Chlorthalidone. Lastly, I am having her maintain her Biotin, dextromethorphan-guaiFENesin, chlorpheniramine, hydrALAZINE, dexlansoprazole, SYMBICORT, FLUTTER, montelukast, diltiazem, fluocinonide-emollient, acetaZOLAMIDE, metoCLOPramide, OXYGEN, mometasone, beclomethasone, albuterol, isosorbide mononitrate, topiramate, cloNIDine, LYRICA, EPINEPHrine, spironolactone, amLODipine, traMADol, ondansetron, NITROSTAT, DULoxetine, lidocaine, LORazepam, VENTOLIN HFA, hyoscyamine, primidone, amitriptyline, Nebivolol HCl, Eszopiclone, and traZODone.  Meds ordered this encounter  Medications  . traZODone (DESYREL) 150 MG tablet    Refill:  1  . Azilsartan-Chlorthalidone (EDARBYCLOR) 40-25 MG TABS    Sig: Take 1 tablet by mouth daily.    Dispense:  21 tablet    Refill:  0  . potassium chloride SA (K-DUR,KLOR-CON) 20 MEQ tablet    Sig: Take 1 tablet (20 mEq total) by mouth 3 (three) times daily.    Dispense:  90 tablet    Refill:  5     Follow-up: Return in about 3 weeks (around 12/26/2015).  Scarlette Calico, MD

## 2015-12-05 NOTE — Progress Notes (Signed)
Pre visit review using our clinic review tool, if applicable. No additional management support is needed unless otherwise documented below in the visit note. 

## 2015-12-05 NOTE — Patient Instructions (Signed)
Hypertension Hypertension, commonly called high blood pressure, is when the force of blood pumping through your arteries is too strong. Your arteries are the blood vessels that carry blood from your heart throughout your body. A blood pressure reading consists of a higher number over a lower number, such as 110/72. The higher number (systolic) is the pressure inside your arteries when your heart pumps. The lower number (diastolic) is the pressure inside your arteries when your heart relaxes. Ideally you want your blood pressure below 120/80. Hypertension forces your heart to work harder to pump blood. Your arteries may become narrow or stiff. Having untreated or uncontrolled hypertension can cause heart attack, stroke, kidney disease, and other problems. RISK FACTORS Some risk factors for high blood pressure are controllable. Others are not.  Risk factors you cannot control include:   Race. You may be at higher risk if you are African American.  Age. Risk increases with age.  Gender. Men are at higher risk than women before age 45 years. After age 65, women are at higher risk than men. Risk factors you can control include:  Not getting enough exercise or physical activity.  Being overweight.  Getting too much fat, sugar, calories, or salt in your diet.  Drinking too much alcohol. SIGNS AND SYMPTOMS Hypertension does not usually cause signs or symptoms. Extremely high blood pressure (hypertensive crisis) may cause headache, anxiety, shortness of breath, and nosebleed. DIAGNOSIS To check if you have hypertension, your health care provider will measure your blood pressure while you are seated, with your arm held at the level of your heart. It should be measured at least twice using the same arm. Certain conditions can cause a difference in blood pressure between your right and left arms. A blood pressure reading that is higher than normal on one occasion does not mean that you need treatment. If  it is not clear whether you have high blood pressure, you may be asked to return on a different day to have your blood pressure checked again. Or, you may be asked to monitor your blood pressure at home for 1 or more weeks. TREATMENT Treating high blood pressure includes making lifestyle changes and possibly taking medicine. Living a healthy lifestyle can help lower high blood pressure. You may need to change some of your habits. Lifestyle changes may include:  Following the DASH diet. This diet is high in fruits, vegetables, and whole grains. It is low in salt, red meat, and added sugars.  Keep your sodium intake below 2,300 mg per day.  Getting at least 30-45 minutes of aerobic exercise at least 4 times per week.  Losing weight if necessary.  Not smoking.  Limiting alcoholic beverages.  Learning ways to reduce stress. Your health care provider may prescribe medicine if lifestyle changes are not enough to get your blood pressure under control, and if one of the following is true:  You are 18-59 years of age and your systolic blood pressure is above 140.  You are 60 years of age or older, and your systolic blood pressure is above 150.  Your diastolic blood pressure is above 90.  You have diabetes, and your systolic blood pressure is over 140 or your diastolic blood pressure is over 90.  You have kidney disease and your blood pressure is above 140/90.  You have heart disease and your blood pressure is above 140/90. Your personal target blood pressure may vary depending on your medical conditions, your age, and other factors. HOME CARE INSTRUCTIONS    Have your blood pressure rechecked as directed by your health care provider.   Take medicines only as directed by your health care provider. Follow the directions carefully. Blood pressure medicines must be taken as prescribed. The medicine does not work as well when you skip doses. Skipping doses also puts you at risk for  problems.  Do not smoke.   Monitor your blood pressure at home as directed by your health care provider. SEEK MEDICAL CARE IF:   You think you are having a reaction to medicines taken.  You have recurrent headaches or feel dizzy.  You have swelling in your ankles.  You have trouble with your vision. SEEK IMMEDIATE MEDICAL CARE IF:  You develop a severe headache or confusion.  You have unusual weakness, numbness, or feel faint.  You have severe chest or abdominal pain.  You vomit repeatedly.  You have trouble breathing. MAKE SURE YOU:   Understand these instructions.  Will watch your condition.  Will get help right away if you are not doing well or get worse.   This information is not intended to replace advice given to you by your health care provider. Make sure you discuss any questions you have with your health care provider.   Document Released: 03/18/2005 Document Revised: 08/02/2014 Document Reviewed: 01/08/2013 Elsevier Interactive Patient Education 2016 [employer]  

## 2015-12-06 MED ORDER — POTASSIUM CHLORIDE CRYS ER 20 MEQ PO TBCR: 20.0000 meq | EXTENDED_RELEASE_TABLET | Freq: Three times a day (TID) | ORAL | 5 refills | Status: DC

## 2015-12-07 ENCOUNTER — Other Ambulatory Visit: Payer: Medicare Other

## 2015-12-07 ENCOUNTER — Ambulatory Visit (HOSPITAL_BASED_OUTPATIENT_CLINIC_OR_DEPARTMENT_OTHER): Payer: Medicare Other | Admitting: Genetic Counselor

## 2015-12-07 ENCOUNTER — Other Ambulatory Visit: Payer: Self-pay | Admitting: Internal Medicine

## 2015-12-07 ENCOUNTER — Other Ambulatory Visit: Payer: Self-pay | Admitting: Neurology

## 2015-12-07 DIAGNOSIS — Z315 Encounter for genetic counseling: Secondary | ICD-10-CM

## 2015-12-07 DIAGNOSIS — Z803 Family history of malignant neoplasm of breast: Secondary | ICD-10-CM | POA: Diagnosis not present

## 2015-12-07 DIAGNOSIS — C50919 Malignant neoplasm of unspecified site of unspecified female breast: Secondary | ICD-10-CM | POA: Diagnosis not present

## 2015-12-07 DIAGNOSIS — Z853 Personal history of malignant neoplasm of breast: Secondary | ICD-10-CM | POA: Diagnosis not present

## 2015-12-07 DIAGNOSIS — L309 Dermatitis, unspecified: Secondary | ICD-10-CM

## 2015-12-08 ENCOUNTER — Encounter: Payer: Self-pay | Admitting: Adult Health

## 2015-12-08 ENCOUNTER — Encounter: Payer: Self-pay | Admitting: Genetic Counselor

## 2015-12-08 ENCOUNTER — Other Ambulatory Visit: Payer: Self-pay | Admitting: Internal Medicine

## 2015-12-08 ENCOUNTER — Other Ambulatory Visit: Payer: Self-pay | Admitting: *Deleted

## 2015-12-08 MED ORDER — ESZOPICLONE 3 MG PO TABS: 3.0000 mg | ORAL_TABLET | Freq: Every evening | ORAL | 3 refills | Status: DC | PRN

## 2015-12-08 MED ORDER — ACETAZOLAMIDE 250 MG PO TABS: 1000.0000 mg | ORAL_TABLET | Freq: Three times a day (TID) | ORAL | 11 refills | Status: DC

## 2015-12-08 NOTE — Telephone Encounter (Signed)
Rx sent 

## 2015-12-08 NOTE — Telephone Encounter (Signed)
rx faxed to pharmacy

## 2015-12-08 NOTE — Telephone Encounter (Signed)
Last given 10/26/15 # 180 tabs and ov pending 9/15 Please advise if okay to refill thanks

## 2015-12-08 NOTE — Progress Notes (Signed)
REFERRING PROVIDER: Lurline Del, MD  PRIMARY PROVIDER:  Scarlette Calico, MD  PRIMARY REASON FOR VISIT:  1. History of breast cancer in female   2. 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 Family history of breast cancer in female      HISTORY OF PRESENT ILLNESS:   Erica [name], a 40 y.o. female, was seen for a [place] cancer genetics consultation at the request of Dr. [name] due to a personal history of triple negative breast cancer at 56 and family history of breast cancer.  Erica [name]. Melchor presents to clinic today to discuss the possibility of a hereditary predisposition to cancer, genetic testing, and to further clarify her future cancer risks, as well as potential cancer risks for family members.   In 2010, at the age of 41, Erica [name] was diagnosed with invasive ductal carcinoma of the left breast.  Hormone receptor status was triple negative. This was treated with left lumpectomy, chemotherapy, and radiation. Erica [name] reports no additional personal history of cancer.  CANCER HISTORY:    Breast cancer of upper-outer quadrant of left female breast (HCC)    Neo-Adjuvant Chemotherapy    Docetaxel and cyclophosphamide x 4      09/2008 Definitive Surgery    Left lumpectomy/SLNB: IDC, ER/PR/ HER2 neu negative, Ki67 88%      09/2008 Pathologic Stage    Stage IIB: ypT2 ypN1      10/2008 Surgery    Full axillary dissection and re-excision of margins       - 03/2009 Chemotherapy    Paclitaxel weekly x 11       - 05/2009 Radiation Therapy    Adjuvant RT to left breast        HORMONAL RISK FACTORS:  Menarche was at age 75.  First live birth at age 70.  OCP use - depo provera for approximately 4 years.  Ovaries intact: yes.  Hysterectomy: no.  Menopausal status: premenopausal, but reports that periods are irregular  HRT use: 0 years. Colonoscopy: no, has had upper endoscopies previously; not examined. Mammogram within the last year: yes. Number of breast  biopsies: 1. Up to date with pelvic exams:  yes. Any excessive radiation exposure/other exposures in the past:  Reports history of secondhand smoke exposure  Past Medical History:  Diagnosis Date  . Anemia, unspecified 04/13/2011  . Asthma   . Asthma 09/27/2010  . Bloating 03/18/2011  . Breast cancer ([place]) 2010   a. locally advanced left breast carcinoma diagnosed in 2010 and treated with neoadjuvant chemotherapy with docetaxel and cyclophosphamide as well as paclitaxel. This was followed by radiation therapy which was completed in 2011.  Marland Kitchen Dyspnea on exertion    a. 01/2013 Lexi MV: EF 54%, no ischemia/infarct;  b. 05/2013 Echo: EF 60-65%, no rwma, Gr 2 DD;  b.   . Fatty liver   . Gastroparesis   . GERD (gastroesophageal reflux disease)   . Hypertension   . Impaired glucose tolerance 04/13/2011  . Lymphadenitis, chronic    restricted LEFT extremity  . 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 OSA (obstructive sleep apnea) 05/04/2014   AHI 31/hr now on CPAP at 12cm H2O    Past Surgical History:  Procedure Laterality Date  . BREAST LUMPECTOMY Left 09/2008  . CARPAL TUNNEL RELEASE Left 2011  . LYMPH NODE DISSECTION Left 2010   breast; 2 wks after breast lumpectomy  . TENDON RELEASE Right 2012   "hand"  . TUBAL LIGATION  05/11/2011  Procedure: POST PARTUM TUBAL LIGATION;  Surgeon: Emeterio Reeve, MD;  Location: [facility];  Service: Gynecology;  Laterality: Bilateral;  Induced for HTN    Social History   Social History  . Marital status: Single    Spouse name: N/A  . Number of children: 3  . Years of education: N/A   Occupational History  .  Unemployed   Social History Main Topics  . Smoking status: Never Smoker  . Smokeless tobacco: Never Used  . Alcohol use 0.0 oz/week     Comment: occasionally/socially  . Drug use: No  . Sexual activity: Yes    Birth control/ protection: Surgical   Other Topics Concern  . None   Social History Narrative   She lives with four children.    She is currently not working (disbaility pending).           FAMILY HISTORY:  We obtained a detailed, 4-generation family history.  Significant diagnoses are listed below: Family History  Problem Relation Age of Onset  . Other Mother 74    breast calcifications treated with surgery, breast cancer pill, and radiation  . Fibroids Sister 8    s/p TAH-BSO  . Diabetes Maternal Grandmother   . Heart disease Maternal Grandmother   . Hypertension Maternal Grandmother   . Breast cancer Other     maternal great aunt (MGM's sister)  . Breast cancer Other     paternal great aunt dx middle ages; s/p mastectomy  . Cancer Neg Hx   . Alcohol abuse Neg Hx   . Early death Neg Hx   . Hyperlipidemia Neg Hx   . Kidney disease Neg Hx   . Stroke Neg Hx   . Colon cancer Neg Hx     Erica [name] has three daughters and one son, ages 37-21.  She has one full sister who is currently 58 and who has a history of a TAH-BSO to address uterine fibroids.  She also has one paternal half brother who is currently 17 and who is also cancer-free.    Erica [name] mother is currently 12.  She was diagnosed with calcifications of her breast last year and this was, at first, thought to be an early breast cancer.  Erica [name] reports that this was treated with surgery, a "breast cancer pill", and radiation, although this was not then being called a breast cancer.  Erica [name] mother is an only child.  Erica [name] maternal grandmother passed away at 53, but not from cancer.  She had a sister with breast cancer at an unspecified age.  Erica [name] grandfather passed away when her mother was just 42 years old; she has no information for his cause of death.  She has no further information for other maternal great aunts/uncles or great grandparents.    Erica [name] father is currently 56.  She has limited information for his health history.  He has four full sisters and five full brothers.  Most of his brothers have passed away.  Erica [name].  [name] has very little information for these aunts and uncles.  She is not aware of any cancer in her paternal first cousins.  Her paternal grandmother passed away from an unspecified cause in her 73s.  Erica [name] paternal grandfather passed away approximately 10 years ago.  She has no further information for other paternal relatives, but she does report that a great aunt (unsure whether on her grandmother or grandfather's side) also has a history of breast cancer, diagnosed in  her middle ages and treated with mastectomy.  Patient's maternal ancestors are of [place] American and Native American descent, and paternal ancestors are of African American descent. There is no reported Ashkenazi Jewish ancestry. There is no known consanguinity.  GENETIC COUNSELING ASSESSMENT: Erica [name] is a 40 y.o. female with a personal and family history of breast cancer which is somewhat suggestive of a hereditary breast cancer syndrome and predisposition to cancer. We, therefore, discussed and recommended the following at today's visit.   DISCUSSION: We reviewed the characteristics, features and inheritance patterns of hereditary cancer syndromes, particularly those caused by mutations within the BRCA1/2, PALB2, and CHEK2 genes. We also discussed genetic testing, including the appropriate family members to test, the process of testing, insurance coverage and turn-around-time for results. We discussed the implications of a negative, positive and/or variant of uncertain significant result. We recommended Erica [name] pursue genetic testing for the 20-gene Breast/Ovarian Cancer Panel through [facility].  The Breast/Ovarian Cancer Panel offered by [facility] Hope Pigeon, MD) includes sequencing and deletion/duplication analysis for the following 19 genes:  ATM, BARD1, BRCA1, BRCA2, BRIP1, CDH1, CHEK2, FANCC, MLH1, MSH2, MSH6, NBN, PALB2, PMS2, PTEN, RAD51C, RAD51D, TP53, and XRCC2.  This panel also includes  deletion/duplication analysis (without sequencing) for one gene, EPCAM.  Based on Erica [name]'s personal and family history of cancer, she meets medical criteria for genetic testing. Despite that she meets criteria, she may still have an out of pocket cost. We discussed that if her out of pocket cost for testing is over $100, the laboratory will call and confirm whether she wants to proceed with testing.  If the out of pocket cost of testing is less than $100 she will be billed by the genetic testing laboratory.   PLAN: After considering the risks, benefits, and limitations, Erica [name]  provided informed consent to pursue genetic testing and the blood sample was sent to [facility] for analysis of the 20-gene Breast/Ovarian Cancer Panel. Results should be available within approximately 2-3 weeks' time, at which point they will be disclosed by telephone to Erica [name], as will any additional recommendations warranted by these results. Erica [name] will receive a summary of her genetic counseling visit and a copy of her results once available. This information will also be available in Epic. We encouraged Erica [name]. Vesey to remain in contact with cancer genetics annually so that we can continuously update the family history and inform her of any changes in cancer genetics and testing that may be of benefit for her family. Erica [name]. Amenta questions were answered to her satisfaction today. Our contact information was provided should additional questions or concerns arise.  Thank you for the referral and allowing [place] to share in the [name] of your patient.   Jeanine Luz, Erica [name], [facility] Certified Genetic Counselor [email or web address] Phone: [phone]  The patient was seen for a total of 60 minutes in face-to-face genetic counseling.  This patient was discussed with Drs. Magrinat, Lindi Adie and/or Burr Medico who agrees with the above.    _______________________________________________________________________ For Office  Staff:  Number of people involved in session: 1 Was an Intern/ student involved with case: no

## 2015-12-11 ENCOUNTER — Encounter: Payer: Self-pay | Admitting: Internal Medicine

## 2015-12-11 DIAGNOSIS — Z853 Personal history of malignant neoplasm of breast: Secondary | ICD-10-CM | POA: Diagnosis not present

## 2015-12-11 DIAGNOSIS — Z880 Allergy status to penicillin: Secondary | ICD-10-CM | POA: Diagnosis not present

## 2015-12-11 DIAGNOSIS — Z886 Allergy status to analgesic agent status: Secondary | ICD-10-CM | POA: Diagnosis not present

## 2015-12-11 DIAGNOSIS — I10 Essential (primary) hypertension: Secondary | ICD-10-CM | POA: Diagnosis not present

## 2015-12-11 DIAGNOSIS — Z881 Allergy status to other antibiotic agents status: Secondary | ICD-10-CM | POA: Diagnosis not present

## 2015-12-11 DIAGNOSIS — Z9889 Other specified postprocedural states: Secondary | ICD-10-CM | POA: Diagnosis not present

## 2015-12-11 DIAGNOSIS — J029 Acute pharyngitis, unspecified: Secondary | ICD-10-CM | POA: Diagnosis not present

## 2015-12-11 DIAGNOSIS — J45909 Unspecified asthma, uncomplicated: Secondary | ICD-10-CM | POA: Diagnosis not present

## 2015-12-11 DIAGNOSIS — B349 Viral infection, unspecified: Secondary | ICD-10-CM | POA: Diagnosis not present

## 2015-12-12 ENCOUNTER — Encounter: Payer: Self-pay | Admitting: Neurology

## 2015-12-13 ENCOUNTER — Other Ambulatory Visit: Payer: Self-pay | Admitting: *Deleted

## 2015-12-13 ENCOUNTER — Telehealth: Payer: Self-pay | Admitting: Oncology

## 2015-12-13 DIAGNOSIS — F419 Anxiety disorder, unspecified: Secondary | ICD-10-CM

## 2015-12-13 MED ORDER — LORAZEPAM 0.5 MG PO TABS: 0.5000 mg | ORAL_TABLET | Freq: Three times a day (TID) | ORAL | 0 refills | Status: DC | PRN

## 2015-12-13 NOTE — Telephone Encounter (Signed)
lvm to inform pt of inj appt per LOS

## 2015-12-15 ENCOUNTER — Encounter: Payer: Self-pay | Admitting: Cardiology

## 2015-12-15 ENCOUNTER — Ambulatory Visit (INDEPENDENT_AMBULATORY_CARE_PROVIDER_SITE_OTHER): Payer: Medicare Other | Admitting: Internal Medicine

## 2015-12-15 VITALS — BP 138/80 | HR 95 | Ht 66.0 in | Wt 188.0 lb

## 2015-12-15 DIAGNOSIS — R05 Cough: Secondary | ICD-10-CM

## 2015-12-15 DIAGNOSIS — J45991 Cough variant asthma: Secondary | ICD-10-CM | POA: Diagnosis not present

## 2015-12-15 DIAGNOSIS — R058 Other specified cough: Secondary | ICD-10-CM

## 2015-12-15 NOTE — Patient Instructions (Signed)
No change in medications for now  For drainage / throat tickle>  try take CHLORPHENIRAMINE  4 mg - take one every 4 hours as needed - - always take 2 at bedtime  GERD (REFLUX)  is an extremely common cause of respiratory symptoms just like yours , many times with no obvious heartburn at all.    It can be treated with medication, but also with lifestyle changes including elevation of the head of your bed (ideally with 6 inch  bed blocks),  Smoking cessation, avoidance of late meals, excessive alcohol, and avoid fatty foods, chocolate, peppermint, colas, red wine, and acidic juices such as orange juice.  NO MINT OR MENTHOL PRODUCTS SO NO COUGH DROPS  USE SUGARLESS CANDY INSTEAD (Jolley ranchers or Stover's or Life Savers) or even ice chips will also do - the key is to swallow to prevent all throat clearing. NO OIL BASED VITAMINS - use powdered substitutes.     Please schedule a follow up office visit in 6 weeks, call sooner if needed to see Tammy for new med calendar

## 2015-12-15 NOTE — Progress Notes (Signed)
Subjective:    Patient ID: Erica [name], female     DOB: 07-Nov-1975    MRN: [id number]    Brief patient profile:  56 yobf with asthma all her life much worse since around 07/2010 referred by Dr [name] to pulmonary clinic in Sept 2012 Hx of Breast Cancer 05/2008    History of Present Illness  12/18/2010 Initial pulmonary office eval on acei and  advair cc dtc asthma x 4 months but baseline = excess saba use "even when better" at least twice daily despite daily advair, worse at hs in fact  used saba 3 x last night.  On chemo /RT for breast ca but last rx was Jan 2012 well before asthma worse.  To er multiple times >  no better with singular added x 3 months. rec Stop Lisinopril Dulera 200 Take 2 puffs first thing in am and then another 2 puffs about 12 hours later.  Work on inhaler technique  Nexium 40 mg   Take 30-60 min before first meal of the day and Pepcid 20 mg one bedtime until return Only use your albuterol as a rescue medication to be used if you can't catch your breath by resting or doing a relaxed purse lip breathing pattern. The less you use it, the better it will work when you need it.  Prednisone 10 mg take  4 each am x 2 days,   2 each am x 2 days,  1 each am x2days and stop    01/03/2011 f/u ov/Erica [name] cc much better off ace and advair but still needing saba twice daily on avg. No  Purulent sputum. rec no change in rx/ rule of 2's reviewed     04/06/2013 NP Follow up and Med Review   We reviewed all her meds and organized them into a med calendar. She does have Medicaid but depends on samples. Did not bring all her meds with her today , says they are at home.  Says her breathing is the same since last ov w/ SOB, Has on /off wheezing, some chest heaviness, prod cough with yellow mucus. Good and bad days. Wears out easily .  Denies f/c/s, nausea, vomiting.  Did not stop singulair as recommended.  rec  Follow med calendar closely and bring to each visit > did not do  We are  referring you for a CPSTwith spirometry before and after> never done   04/18/2014 NP Follow up and Med Review / NP  Patient returns for a two-week follow-up and medication review. We reviewed all her medications organize them into a medication count with patient education It appears that she is taking her medications correctly, however, she has over 30 medications and has had several changes recently. Last visit, her [name] was decreased to the 164mcg says that her breathing was better on the 259mcg Patient had a chest x-ray last visit that showed no acute changes She underwent a CT sinus due to persistent cough , which was positive for sinusitis on the right She was treated with a ten-day course of Avelox, she has one dose left Does feel some better but still has cough . She denies any hemoptysis, orthopnea, PND or leg swelling  Patient has upcoming sleep study. Next week for suspected underlying sleep apnea with snoring and daytime sleepiness rec Refer to ENT for sinusitis and chronic cough > never saw one> f/u sinus CT neg 07/26/14  Increase Dulera 200 2 puffs Twice daily   Follow med calendar closely  and bring to each visit.     08/01/2014 f/u ov/Erica [name] re:  Asthma plus noct "gasping"  Chief Complaint  Patient presents with  . Follow-up    doing well.  no complaints.  not doing well x one year with noct / early  4am with cough/ gasping for air almost always goes to neb and then can't go back to sleep even 30 min later better Daytime in chest tightness resolves  completely while on prednisone last took one month prior to OV  And doesn't have it now but has exact same noct symptoms and overusing saba  Doe x mailbox and back to house out of breath and coughing  >>change dulera 200 to 100   08/30/2014 NP Follow up and Med review : Asthma Patient presents for a one-month follow-up We reviewed all her medications organize them into a medication count with patient education Patient has had  multiple medication changes recently She has been having difficulty with resistant hypertension and visual changes. Recently started on primidone and spironolactone . However, has not started this as of yet That she needs to go pick this up at the pharmacy. Neuro notes reviewed , has been referred to NS . B/p is elevated again at todays visit, discussed follow up with PCP for b/p management.  She tried to decrease her Dulera as recommended from last ov with Dr. Melvyn Novas    however, was unable to and is back on 200 dose .  Says overall her breathing is doing ok  No flare of cough or wheezing  Does have a lot of drainage in throat . Discussed starting on chlortrimeton.  rec Continue Dulera 200 2 puffs Twice daily  , rinse after use  Follow med calendar closely and bring to each visit.  Follow up with Primary MD concerning elevated blood pressure    10/05/2014 f/u ov/Erica [name] re: asthma with component of uacs/ no med calendar  Chief Complaint  Patient presents with  . Follow-up    Here to discuss meds- Dulera not on her drug formulary. Pt states breathing is doing well and denies any new co's today.  turns out using neb at least once every 24 h, esp between 2 and 4 am when wakes up with severe dry cough x months/ pred helps some but never eliminated noct cough rec symbicort 160 Take 2 puffs first thing in am and then another 2 puffs about 12 hours later.  Try taking the tramadol around 2 am to see if helps with the early am cough/ wheeze  See calendar for specific medication instructions and bring it back for each and every office visit  Separating the top medications from the bottom group is fundamental to providing you adequate care going forward.   Please schedule a follow up office visit in 2 weeks, sooner if needed with all medications in 2 separate bags    10/19/2014 f/u ov/Erica [name] re:  Asthma vs uacs/ not using med calendar correclty  Chief Complaint  Patient presents with  . Follow-up     Cough has changed sicnce starting symbicort- prod cough with "dirty" sputum. She states she feels that she can tell when her PM dose is due b/c she starts feeling SOB. She is using proair once per day on average.   not using symbicort 160 correctly  rec Prednisone 10 mg take  4 each am x 2 days,  2 each am x 2 days,  1 each am x 2 days and stop  Bystolic  10 mg take twice daily until return  Take 2 chlortrimeton at bedtime and 2 more 4 hours later  Continue symbicort 160 Take 2 puffs first thing in am and then another 2 puffs about 12 hours later.  Only use your albuterol as rescue  Only use nebulizer if you try proventil first and it fails to work    11/08/2014 Follow up : Asthma /UAC   Pt returns for follow up .  Last ov with asthma flare , tx with pred taper  She is feeling better Breathing is doing better and back to her baseline  Still has dry cough but some better.  We reviewed all her meds and organized them into a med calendar with pt education  Appears to be taking correctly although she is on over 30 medications .  B/p is improved on current regimen.  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 Follow-up    C/o increase SOB, prod cough (tan color phlem), wheezing, chest tx, nasal cong  worse x one week but at baseline doe x more than slow walk and neb q day around 4 am as the hfa not helping with noct symptoms no better even on prednisone rec Prednisone 10 mg take  4 each am x 2 days,  2 each am x 2 days,  1 each am x 2 days and stop      03/24/2015 f/u  Pulmonary office visit/ Erica [name]  Asthma vs vcd Chief Complaint  Patient presents with  . Follow-up    Pt c/o increased cough with tan mucus, wheeze and SOB. Pt reports increase in symptoms with weather changes. Pt has used albuterol HFA with some symptom relief. Pt denies f/n/v/d.   2 weeks prior to OV  Noted needing the noct neb again  where this problem had resolved on pred  then 2 d after that bad cough p [employer] shopping and worse since then but still comfortable p saba rx rec Prednisone 10 mg take  4 each am x 2 days,   2 each am x 2 days,  1 each am x 2 days and stop  For cough use mucinex dm up to 1200 mg every 12 hours and the flutter valve as much as possible and if still can't control the cough > tramadol as per action plan on med calendar    06/29/2015  f/u ov/Erica [name] re: asthma and unrelated cough / no med calendar  Chief Complaint  Patient presents with  . Follow-up    Cough has not improved since she was last seen.  She is using albuterol inhaler 2 x daily and neb with albuterol 1 x daily on average.   MCT did not reproduce the cough but the sob and wheeze  Unless w/in two weeks of taking prednisone, resumes 4 am wheezing/ gasping for air> almost every night needs to back up proair with neb  On dex daily taken before supper  avg use of tramadol is 6 x in 24 h unless on pred  Prednisone 10 mg take  4 each am x 2 days,   2 each am x 2 days,  1 each am x 2 days and stop  Qvar 80 Take 2 puffs first thing in am and then another 2 puffs about 12 hours later.  change dexilant Take 30-60 min before first meal of the day and Add Pepcid 20 mg  at bedtime  For drainage / throat tickle try take CHLORPHENIRAMINE  4 mg - take one every 4 hours as needed - available over the counter- may cause drowsiness so start with just a bedtime dose or two and see how you tolerate it before trying in daytime   See Tammy NP w/in 2 weeks(or next available after than)  with all your medications,  At f/u (given one sample = 120 pffs)     07/18/2015 NP  Follow up : Asthma vs Cough  Pt returns for 2 week follow up . Seen last ov with Asthma flare , tx w/ steroid taper.  She is feeling better. Still has some lingering cough on/off.  QVAR was added to her symbicort .  We reviewed all her meds and organized them into a med calendar with pt  education.  Appears to be taking correctly rec Follow med calendar    08/31/2015 acute extended ov/Erica [name] re: asthma flare on maint rx with symb 160/qvar 80 2bid Chief Complaint  Patient presents with  . Acute Visit    Pt c/o wheezing, increased SOB, chest tightness and cough with white sputum x 2 days.   baseline still needed albuterol with activity regardless of time of day  Before was using neb nightly but that improved p last ov then p  cleaning out storage shed >  Tongue felt full /sob wheezing > 2 ER Trips 08/19/15  > benadryl/ pepcid no pred >  08/29/15 returned to ER > mgs02/ prednisone at 40 mg per day/ only used saba hfa today / cough less of a problem with this flare  rec Prednisone 40 mg x 4 days and 20 mg x 4 days and 10 mg daily x 4 days and off Add pepcid 20 mg one at bedtime  For cough > mucinex dm up to 1200 mg every 12 hours, supplement with tramadol and use the flutter valve as much as possible    11/17/2015  f/u ov/Erica [name] re: 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 Follow-up    pt c/o prod cough with beige mucus, sob with exertion.     just using albuterol about half way thru each day for sob  Still wakes every morning around 4 am coughing unless actively taking prednisone and helps x 2 weeks then gradually worse again. 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 Follow-up    4wk rov   doing  better though still wakes up nightly with cough / denies using tramadol or albuterol much at all now    No obvious  Patterns  in day to day or daytime variability or assoc excess/ purulent sputum or mucus plugs or hemoptysis or cp or chest tightness,  or overt sinus or hb symptoms.  No unusual exp hx or h/o childhood pna/ asthma or knowledge of premature birth.  Sleeping ok without nocturnal  or early am exacerbation  of respiratory  c/o's or need for noct saba. Also denies any obvious fluctuation of symptoms with weather or environmental changes or other aggravating or alleviating factors except as outlined above   Current Medications, Allergies, Complete Past Medical History, Past Surgical History, Family History, and Social History were reviewed in [employer] record.  ROS  The following are not active complaints unless bolded sore throat, dysphagia, dental problems, itching, sneezing,  nasal congestion or excess/ purulent secretions, ear ache,   fever, chills, sweats, unintended wt loss, classically pleuritic or exertional cp,  orthopnea pnd or leg swelling, presyncope, palpitations, abdominal pain, anorexia, nausea, vomiting, diarrhea  or change in bowel or bladder habits, change in stools or urine, dysuria,hematuria,  rash, arthralgias, visual complaints, headache, numbness, weakness or ataxia or problems with walking or coordination,  change in mood/affect or memory.                 Objective:   Physical Exam  Obese bf nad  vital signs reviewed     Wt 213  12/18/10 > 01/03/2011  214 > 01/31/2011  206 >  09/11/2012 209 >208 6/19 > 210 09/25/2012 > 11/20/2012 211 >208 12/07/12> 01/12/2013 209 > 02/09/2013 210 >207 04/06/2013 > 07/14/2013  213>212 07/28/2013 >204 01/26/2014> 04/04/2014 206  >205 04/18/2014 >  07/18/2014 201 >  08/01/14 201 >198 08/30/2014 > 10/05/14  195 > 10/19/2014 192 > 191 11/08/2014 > 02/10/2015  198 > 03/24/2015   198 > 06/29/2015   201 > 08/31/2015  204 > 11/17/2015  197  > 12/15/2015  188    HEENT: nl dentition, turbinates, and orophanx. Nl external ear canals without cough reflex,     NECK :  without JVD/Nodes/TM/ nl carotid upstrokes bilaterally   LUNGS: lungs clear bilaterally to A and P    CV:  RRR  no s3 or murmur or increase in P2, no edema   ABD:  soft and nontender with nl excursion in the supine position. No bruits or organomegaly, bowel sounds nl  MS:  warm without deformities, calf tenderness, cyanosis or clubbing        I personally reviewed images and agree with radiology impression as follows:  CXR:  08/29/15 No evidence of acute cardiopulmonary disease.

## 2015-12-16 ENCOUNTER — Encounter: Payer: Self-pay | Admitting: Internal Medicine

## 2015-12-16 NOTE — Assessment & Plan Note (Signed)
MRI 05/18/15 > Paranasal sinuses are clear - 11/17/2015 rec increase h1 to 2 hs to help with noct cough  - Allergy profile 11/17/2015 >  Eos 0.1 /  IgE  80 POS RAST  to grass, ragweed, cedar tree   Suspect her noct episodes are gerd or pnds/ not asthma and emphasized approp use of 1st gen H1 as per her med calendar which she says she has at home but clearly does not follow as the h1 is listed 2 at hs and prn during the day

## 2015-12-16 NOTE — Assessment & Plan Note (Signed)
-   PFT's 01/31/2011 FEV1  1.76 (60% ) ratio 68 and no better p B2,  DLCO 70% -med review 09/25/2012 > Dulera drug assistance  paperwork given    - HFA 90% 11/20/2012 .  -Med calendar 12/07/12 , 04/06/2013 , 04/18/2014,, 08/30/2014 , 11/08/2014  - PFT's 01/12/2013  1.73 (65%)  Ratio 73 and no better p B2, DLCO 86% - trial off singulair 02/08/13 >>>did not stop  - 04/05/2014  trial of dulera 100 instead of 200 > worse  -04/18/2014 increase Dulera 200  - 08/01/2014 p extensive coaching HFA effectiveness =    90% - 08/01/2014  Walked RA x 2laps @ 185 ft each stopped due to cough > sob/ no desat moderate pace  - spirometry 08/01/2014  FEV1  1.87 (70%) ratio 78  fef 25-75 53%  - spirometry 02/10/2015  flattening of top portion of f/v loop only    - 02/10/2015   Walked RA  2 laps @ 185 ft each stopped due to cough > sob/ slow pace   -  NO 03/24/2015   =  16 - Methacholine challenge 04/04/2015   POS asthma = wheeze/sob but no coughing fits > added qvar 80 2bid 06/29/2015 >>>   -flare sarting Aug 19 2015 p exp to storage shed  - 08/31/2015  After extensive coaching HFA effectiveness =    75% (Ti too short)  - Spirometry 08/31/2015  FEV1 0.75 (28%)  Ratio 68  - FENO 11/17/2015  =  8  on symb 160 2bid and qvar 80 2bid    Overall improved though on an extermely complex regimen that she still fails to understand how use to her best benefit - see concerns re use of h1 under cough a/p  I had an extended discussion with the patient reviewing all relevant studies completed to date and  lasting 15 to 20 minutes of a 25 minute visit    Each maintenance medication was reviewed in detail including most importantly the difference between maintenance and prns and under what circumstances the prns are to be triggered using an action plan format that is not reflected in the computer generated alphabetically organized AVS but trather by a customized med calendar that reflects the AVS meds with confirmed 100% correlation.   Please see  instructions for details which were reviewed in writing and the patient given a copy highlighting the part that I personally wrote and discussed at today's ov.

## 2015-12-16 NOTE — Assessment & Plan Note (Signed)
Complicated by reflux/ hpb/ restrictive pfts  Body mass index is 30.34  Trending down now  Lab Results  Component Value Date   TSH 1.06 06/15/2015     Contributing to gerd tendency/ doe/reviewed the need and the process to achieve and maintain neg calorie balance > defer f/u primary care including intermittently monitoring thyroid status

## 2015-12-20 ENCOUNTER — Telehealth: Payer: Self-pay | Admitting: Genetic Counselor

## 2015-12-21 DIAGNOSIS — F29 Unspecified psychosis not due to a substance or known physiological condition: Secondary | ICD-10-CM | POA: Diagnosis not present

## 2015-12-21 DIAGNOSIS — F419 Anxiety disorder, unspecified: Secondary | ICD-10-CM | POA: Diagnosis not present

## 2015-12-25 ENCOUNTER — Other Ambulatory Visit: Payer: Self-pay | Admitting: *Deleted

## 2015-12-25 DIAGNOSIS — C50412 Malignant neoplasm of upper-outer quadrant of left female breast: Secondary | ICD-10-CM

## 2015-12-26 ENCOUNTER — Ambulatory Visit (HOSPITAL_BASED_OUTPATIENT_CLINIC_OR_DEPARTMENT_OTHER): Payer: Medicare Other

## 2015-12-26 ENCOUNTER — Other Ambulatory Visit (HOSPITAL_BASED_OUTPATIENT_CLINIC_OR_DEPARTMENT_OTHER): Payer: Medicare Other

## 2015-12-26 VITALS — BP 175/80 | HR 97 | Temp 98.7°F | Resp 20

## 2015-12-26 DIAGNOSIS — C50412 Malignant neoplasm of upper-outer quadrant of left female breast: Secondary | ICD-10-CM | POA: Diagnosis present

## 2015-12-26 DIAGNOSIS — Z5112 Encounter for antineoplastic immunotherapy: Secondary | ICD-10-CM

## 2015-12-26 LAB — COMPREHENSIVE METABOLIC PANEL
ALBUMIN: 3.4 g/dL — AB (ref 3.5–5.0)
ALK PHOS: 79 U/L (ref 40–150)
ALT: 20 U/L (ref 0–55)
ANION GAP: 11 meq/L (ref 3–11)
AST: 17 U/L (ref 5–34)
BILIRUBIN TOTAL: 0.34 mg/dL (ref 0.20–1.20)
BUN: 9.1 mg/dL (ref 7.0–26.0)
CO2: 27 meq/L (ref 22–29)
Calcium: 9.5 mg/dL (ref 8.4–10.4)
Chloride: 105 mEq/L (ref 98–109)
Creatinine: 1 mg/dL (ref 0.6–1.1)
EGFR: 78 mL/min/{1.73_m2} — AB (ref >90)
Glucose: 110 mg/dl (ref 70–140)
Potassium: 3.5 mEq/L (ref 3.5–5.1)
Sodium: 143 mEq/L (ref 136–145)
TOTAL PROTEIN: 7.5 g/dL (ref 6.4–8.3)

## 2015-12-26 LAB — CBC WITH DIFFERENTIAL/PLATELET
BASO%: 0.7 % (ref 0.0–2.0)
BASOS ABS: 0 10*3/uL (ref 0.0–0.1)
EOS ABS: 0.1 10*3/uL (ref 0.0–0.5)
EOS%: 1.4 % (ref 0.0–7.0)
HCT: 41.8 % (ref 34.8–46.6)
HGB: 13.6 g/dL (ref 11.6–15.9)
LYMPH%: 22.1 % (ref 14.0–49.7)
MCH: 28.8 pg (ref 25.1–34.0)
MCHC: 32.4 g/dL (ref 31.5–36.0)
MCV: 89 fL (ref 79.5–101.0)
MONO#: 0.4 10*3/uL (ref 0.1–0.9)
MONO%: 5.9 % (ref 0.0–14.0)
NEUT%: 69.9 % (ref 38.4–76.8)
NEUTROS ABS: 5.3 10*3/uL (ref 1.5–6.5)
PLATELETS: 239 10*3/uL (ref 145–400)
RBC: 4.7 10*6/uL (ref 3.70–5.45)
RDW: 14.9 % — ABNORMAL HIGH (ref 11.2–14.5)
WBC: 7.5 10*3/uL (ref 3.9–10.3)
lymph#: 1.7 10*3/uL (ref 0.9–3.3)

## 2015-12-26 MED ORDER — GOSERELIN ACETATE 3.6 MG ~~LOC~~ IMPL
3.6000 mg | DRUG_IMPLANT | Freq: Once | SUBCUTANEOUS | Status: AC
  Administered 2015-12-26: 3.6 mg via SUBCUTANEOUS
  Filled 2015-12-26: qty 3.6

## 2015-12-26 NOTE — Patient Instructions (Signed)
Goserelin injection What is this medicine? GOSERELIN (GOE se rel in) is similar to a hormone found in the body. It lowers the amount of sex hormones that the body makes. Men will have lower testosterone levels and women will have lower estrogen levels while taking this medicine. In men, this medicine is used to treat prostate cancer; the injection is either given once per month or once every 12 weeks. A once per month injection (only) is used to treat women with endometriosis, dysfunctional uterine bleeding, or advanced breast cancer. This medicine may be used for other purposes; ask your health care provider or pharmacist if you have questions. What should I tell my health care provider before I take this medicine? They need to know if you have any of these conditions (some only apply to women): -diabetes -heart disease or previous heart attack -high blood pressure -high cholesterol -kidney disease -osteoporosis or low bone density -problems passing urine -spinal cord injury -stroke -tobacco smoker -an unusual or allergic reaction to goserelin, hormone therapy, other medicines, foods, dyes, or preservatives -pregnant or trying to get pregnant -breast-feeding How should I use this medicine? This medicine is for injection under the skin. It is given by a health care professional in a hospital or clinic setting. Men receive this injection once every 4 weeks or once every 12 weeks. Women will only receive the once every 4 weeks injection. Talk to your pediatrician regarding the use of this medicine in children. Special care may be needed. Overdosage: If you think you have taken too much of this medicine contact a poison control center or emergency room at once. NOTE: This medicine is only for you. Do not share this medicine with others. What if I miss a dose? It is important not to miss your dose. Call your doctor or health care professional if you are unable to keep an appointment. What may  interact with this medicine? -female hormones like estrogen -herbal or dietary supplements like black cohosh, chasteberry, or DHEA -female hormones like testosterone -prasterone This list may not describe all possible interactions. Give your health care provider a list of all the medicines, herbs, non-prescription drugs, or dietary supplements you use. Also tell them if you smoke, drink alcohol, or use illegal drugs. Some items may interact with your medicine. What should I watch for while using this medicine? Visit your doctor or health care professional for regular checks on your progress. Your symptoms may appear to get worse during the first weeks of this therapy. Tell your doctor or healthcare professional if your symptoms do not start to get better or if they get worse after this time. Your bones may get weaker if you take this medicine for a long time. If you smoke or frequently drink alcohol you may increase your risk of bone loss. A family history of osteoporosis, chronic use of drugs for seizures (convulsions), or corticosteroids can also increase your risk of bone loss. Talk to your doctor about how to keep your bones strong. This medicine should stop regular monthly menstration in women. Tell your doctor if you continue to menstrate. Women should not become pregnant while taking this medicine or for 12 weeks after stopping this medicine. Women should inform their doctor if they wish to become pregnant or think they might be pregnant. There is a potential for serious side effects to an unborn child. Talk to your health care professional or pharmacist for more information. Do not breast-feed an infant while taking this medicine. Men should   inform their doctors if they wish to father a child. This medicine may lower sperm counts. Talk to your health care professional or pharmacist for more information. What side effects may I notice from receiving this medicine? Side effects that you should  report to your doctor or health care professional as soon as possible: -allergic reactions like skin rash, itching or hives, swelling of the face, lips, or tongue -bone pain -breathing problems -changes in vision -chest pain -feeling faint or lightheaded, falls -fever, chills -pain, swelling, warmth in the leg -pain, tingling, numbness in the hands or feet -signs and symptoms of low blood pressure like dizziness; feeling faint or lightheaded, falls; unusually weak or tired -stomach pain -swelling of the ankles, feet, hands -trouble passing urine or change in the amount of urine -unusually high or low blood pressure -unusually weak or tired Side effects that usually do not require medical attention (report to your doctor or health care professional if they continue or are bothersome): -change in sex drive or performance -changes in breast size in both males and females -changes in emotions or moods -headache -hot flashes -irritation at site where injected -loss of appetite -skin problems like acne, dry skin -vaginal dryness This list may not describe all possible side effects. Call your doctor for medical advice about side effects. You may report side effects to FDA at 1-800-FDA-1088. Where should I keep my medicine? This drug is given in a hospital or clinic and will not be stored at home. NOTE: This sheet is a summary. It may not cover all possible information. If you have questions about this medicine, talk to your doctor, pharmacist, or health care provider.    2016, Elsevier/Gold Standard. (2013-05-25 11:10:35)  

## 2015-12-27 ENCOUNTER — Ambulatory Visit: Payer: Self-pay | Admitting: Genetic Counselor

## 2015-12-27 ENCOUNTER — Encounter: Payer: Self-pay | Admitting: Genetic Counselor

## 2015-12-27 DIAGNOSIS — Z853 Personal history of malignant neoplasm of breast: Secondary | ICD-10-CM

## 2015-12-27 DIAGNOSIS — Z803 Family history of malignant neoplasm of breast: Secondary | ICD-10-CM

## 2015-12-27 DIAGNOSIS — Z1379 Encounter for other screening for genetic and chromosomal anomalies: Secondary | ICD-10-CM | POA: Insufficient documentation

## 2015-12-27 NOTE — Telephone Encounter (Signed)
Discussed with Erica [name] that her genetic test result was negative for mutations within any of 20 genes on the Breast/Ovarian Cancer Panel through [employer].  Additionally, no uncertain changes were found.  Discussed that we still do not have an explanation for her personal history of early-onset breast cancer, but that most cancer happens by chance.  Encouraged her to follow-up with [place] in the future, especially if anyone else in the family is diagnosed with cancer.  She should continue to follow her doctors' recommendations for future cancer screening.  Her sister, nieces, and daughters are all at an increased risk for breast cancer due to her history.  Recommended her daughters and nieces begin annual mammograms at 1, as she was diagnosed at 62.  [name] is still having issues with abnormal bleeding and is still wondering about a hysterectomy.  Discussed that she should still bring this up with her PCP/Gynecologist.  She plans to do this.  She would like a copy of her test results mailed to her, and I am happy to do that.  She knows she is welcome to call with any questions she may have.

## 2015-12-28 DIAGNOSIS — G932 Benign intracranial hypertension: Secondary | ICD-10-CM | POA: Diagnosis not present

## 2015-12-29 ENCOUNTER — Ambulatory Visit: Payer: Medicare Other | Admitting: Cardiology

## 2015-12-29 ENCOUNTER — Encounter: Payer: Self-pay | Admitting: Cardiology

## 2016-01-02 ENCOUNTER — Ambulatory Visit: Payer: Medicare Other | Admitting: Internal Medicine

## 2016-01-02 ENCOUNTER — Encounter: Payer: Self-pay | Admitting: Internal Medicine

## 2016-01-05 ENCOUNTER — Encounter: Payer: Self-pay | Admitting: Cardiology

## 2016-01-05 ENCOUNTER — Ambulatory Visit (INDEPENDENT_AMBULATORY_CARE_PROVIDER_SITE_OTHER): Payer: Medicare Other | Admitting: Cardiology

## 2016-01-05 VITALS — BP 130/90 | HR 100 | Ht 66.0 in | Wt 186.0 lb

## 2016-01-05 DIAGNOSIS — I5032 Chronic diastolic (congestive) heart failure: Secondary | ICD-10-CM

## 2016-01-05 DIAGNOSIS — I10 Essential (primary) hypertension: Secondary | ICD-10-CM | POA: Diagnosis not present

## 2016-01-05 DIAGNOSIS — I5033 Acute on chronic diastolic (congestive) heart failure: Secondary | ICD-10-CM

## 2016-01-05 DIAGNOSIS — J209 Acute bronchitis, unspecified: Secondary | ICD-10-CM

## 2016-01-05 DIAGNOSIS — I110 Hypertensive heart disease with heart failure: Secondary | ICD-10-CM

## 2016-01-05 DIAGNOSIS — G4733 Obstructive sleep apnea (adult) (pediatric): Secondary | ICD-10-CM | POA: Diagnosis not present

## 2016-01-05 DIAGNOSIS — Z9989 Dependence on other enabling machines and devices: Secondary | ICD-10-CM

## 2016-01-05 DIAGNOSIS — R0602 Shortness of breath: Secondary | ICD-10-CM

## 2016-01-05 MED ORDER — AZITHROMYCIN 250 MG PO TABS: 250.0000 mg | ORAL_TABLET | Freq: Every day | ORAL | 0 refills | Status: DC

## 2016-01-05 MED ORDER — DM-GUAIFENESIN ER 30-600 MG PO TB12: 1.0000 | ORAL_TABLET | Freq: Two times a day (BID) | ORAL | 0 refills | Status: DC

## 2016-01-05 NOTE — Patient Instructions (Signed)
Medication Instructions:   START TAKING Z-PAK 250 MG--TAKE 2 TABLETS (500 MG TOTAL) TODAY AS YOUR 1ST INITIAL DOSE, THEN TAKE ONE TABLET DAILY THEREAFTER.   REFILLED YOUR MUCINEX DM-- PLEASE TAKE THIS AS ALREADY PRESCRIBED, DURING THE COURSE OF YOUR BRONCHITIS     Follow-Up:  EXTENDER IN OUR OFFICE IN 2 WEEKS PER DR NELSON       If you need a refill on your cardiac medications before your next appointment, please call your pharmacy.

## 2016-01-05 NOTE — Progress Notes (Signed)
Patient ID: ETOLIA SPATAFORA, female   DOB: [birth year], 40 y.o.   MRN: [id number]    Patient Name: Erica [name] Date of Encounter: 01/05/2016  Primary Care Provider:  Scarlette Calico, MD Primary Cardiologist:  Ena Dawley  Patient Profile  Chest pain  Problem List   Past Medical History:  Diagnosis Date  . Anemia, unspecified 04/13/2011  . Asthma   . Asthma 09/27/2010  . Bloating 03/18/2011  . Breast cancer ([place]) 2010   a. locally advanced left breast carcinoma diagnosed in 2010 and treated with neoadjuvant chemotherapy with docetaxel and cyclophosphamide as well as paclitaxel. This was followed by radiation therapy which was completed in 2011.  Marland Kitchen Dyspnea on exertion    a. 01/2013 Lexi MV: EF 54%, no ischemia/infarct;  b. 05/2013 Echo: EF 60-65%, no rwma, Gr 2 DD;  b.   . Fatty liver   . Gastroparesis   . GERD (gastroesophageal reflux disease)   . Hypertension   . Impaired glucose tolerance 04/13/2011  . Lymphadenitis, chronic    restricted LEFT extremity  . 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 OSA (obstructive sleep apnea) 05/04/2014   AHI 31/hr now on CPAP at 12cm H2O   Past Surgical History:  Procedure Laterality Date  . BREAST LUMPECTOMY Left 09/2008  . CARPAL TUNNEL RELEASE Left 2011  . LYMPH NODE DISSECTION Left 2010   breast; 2 wks after breast lumpectomy  . TENDON RELEASE Right 2012   "hand"  . TUBAL LIGATION  05/11/2011   Procedure: POST PARTUM TUBAL LIGATION;  Surgeon: Emeterio Reeve, MD;  Location: [facility];  Service: Gynecology;  Laterality: Bilateral;  Induced for HTN   Allergies  Allergies  Allergen Reactions  . Aspirin Shortness Of Breath and Palpitations    Pt can take ibuprofen without reaction  . Bactrim [Sulfamethoxazole-Trimethoprim] Anaphylaxis  . Doxycycline Anaphylaxis  . Penicillins Shortness Of Breath and Palpitations    Has patient had a PCN reaction causing immediate rash, facial/tongue/throat swelling, SOB or lightheadedness with hypotension:  Yes Has patient had a PCN reaction causing severe rash involving mucus membranes or skin necrosis: Yes Has patient had a PCN reaction that required hospitalization Yes Has patient had a PCN reaction occurring within the last 10 years: No If all of the above answers are "NO", then may proceed with Cephalosporin use.  Severiano Gilbert Extract Itching and Swelling    Lip swelling  . Strawberry Extract Hives   Chief complain: recurrent syncope  HPI  40 year old female with h/o asthma, obesity, hypertension, who is coming with complaints of chest pain. This pleasant young female has a history of locally advanced left breast carcinoma diagnosed in 2010 and treated with neoadjuvant chemotherapy with docetaxel and cyclophosphamide as well as paclitaxel. This was followed by radiation therapy which was completed in 2011. The patient complains of chest pain it is not exertional, it is locally located in the retrosternal and left epigastric region. It has been happening anytime of the day, and can last for minutes. There is no clear aggravating or alleviating factor. The patient states that ever since her chemotherapy she gets short of breath with minimal activity and states that she will be able to walk a block.  09/27/2013 - the patient is coming complaining of continuous daily wheezing, and nonexertional right upper quadrant pain that force her to go to the ER. A complete workup including abdominal ultrasound and CAT scan was performed. The only finding was fatty liver no acute pathology in the  gallbladder no gallstones. The patient had an upper EGD last year where her gastroparesis was diagnosed and she was started on metoclopramide and proton next. Her symptoms improved however she continues having episodes of this pain. She feels overall very tired and short of breath and complaints of orthopnea.  11/30/2013 - we stopped Bystolic at the last visit as it caused wheezing, her wheezing has improved but she feels more  SOB. She is trying to exercise but gets SOB easily.  01/05/2016 - the patient is coming after 6 months, she has been experiencing significant cough with mucopurulent sputum, and associated sharp chest pain while coughing, no exertional chest pain. No lower extremity edema orthopnea or proximal nocturnal dyspnea no palpitations or syncope. She has been using her asthma medications and she stays hydrated.  Home Medications  Current Outpatient Prescriptions:  .  acetaZOLAMIDE (DIAMOX) 250 MG tablet, Take 4 tablets (1,000 mg total) by mouth 3 (three) times daily., Disp: 400 tablet, Rfl: 11 .  albuterol (PROVENTIL) (2.5 MG/3ML) 0.083% nebulizer solution, USE 1 VIAL VIA NEBULIZER EVERY 4 HOURS AS NEEDED( NEED APPOINTMENT), Disp: 300 mL, Rfl: 2 .  amitriptyline (ELAVIL) 100 MG tablet, Take 1 tablet (100 mg total) by mouth at bedtime., Disp: 90 tablet, Rfl: 1 .  amLODipine (NORVASC) 10 MG tablet, TAKE 1 TABLET(10 MG) BY MOUTH DAILY, Disp: 90 tablet, Rfl: 0 .  Azilsartan-Chlorthalidone (EDARBYCLOR) 40-25 MG TABS, Take 1 tablet by mouth daily., Disp: 21 tablet, Rfl: 0 .  beclomethasone (QVAR) 80 MCG/ACT inhaler, Inhale 2 puffs into the lungs 2 (two) times daily., Disp: 1 Inhaler, Rfl: 5 .  Biotin 10 MG TABS, Take 1 tablet by mouth every morning. , Disp: , Rfl:  .  chlorpheniramine (CHLOR-TRIMETON) 4 MG tablet, Take 8 mg by mouth at bedtime., Disp: , Rfl:  .  cloNIDine (CATAPRES) 0.3 MG tablet, TAKE 1 TABLET BY MOUTH TWICE DAILY, Disp: 180 tablet, Rfl: 3 .  DEXILANT 60 MG capsule, TAKE ONE CAPSULE BY MOUTH DAILY, Disp: 30 capsule, Rfl: 0 .  dexlansoprazole (DEXILANT) 60 MG capsule, Take 60 mg by mouth daily before breakfast. , Disp: , Rfl:  .  dextromethorphan-guaiFENesin (MUCINEX DM) 30-600 MG per 12 hr tablet, Take 1 tablet by mouth 2 (two) times daily. , Disp: , Rfl:  .  diltiazem (CARDIZEM LA) 420 MG 24 hr tablet, Take 1 tablet (420 mg total) by mouth daily., Disp: 90 tablet, Rfl: 3 .  DULoxetine  (CYMBALTA) 60 MG capsule, Take 1 capsule (60 mg total) by mouth daily., Disp: 90 capsule, Rfl: 3 .  EPINEPHrine 0.3 mg/0.3 mL IJ SOAJ injection, Inject 0.3 mLs (0.3 mg total) into the muscle once as needed (anaphylaxis reaction)., Disp: 2 Device, Rfl: 5 .  Eszopiclone 3 MG TABS, Take 1 tablet (3 mg total) by mouth at bedtime as needed., Disp: 30 tablet, Rfl: 3 .  fluocinonide-emollient (LIDEX-E) 0.05 % cream, APPLY EXTERNALLY TO THE AFFECTED AREA TWICE DAILY, Disp: 60 g, Rfl: 0 .  hydrALAZINE (APRESOLINE) 100 MG tablet, TAKE 1 TABLET(100 MG) BY MOUTH THREE TIMES DAILY, Disp: 90 tablet, Rfl: 0 .  hyoscyamine (LEVSIN SL) 0.125 MG SL tablet, Place under the tongue., Disp: , Rfl:  .  isosorbide mononitrate (IMDUR) 60 MG 24 hr tablet, Take 60 mg by mouth every morning., Disp: , Rfl:  .  lidocaine (XYLOCAINE) 5 % ointment, Apply 1 application topically 2 (two) times daily as needed., Disp: 50 g, Rfl: 5 .  LORazepam (ATIVAN) 0.5 MG tablet, Take 1 tablet (  0.5 mg total) by mouth every 8 (eight) hours as needed. for anxiety, Disp: 90 tablet, Rfl: 0 .  LYRICA 200 MG capsule, TAKE ONE CAPSULE BY MOUTH THREE TIMES DAILY, Disp: 90 capsule, Rfl: 0 .  metoCLOPramide (REGLAN) 10 MG tablet, Take 10 mg by mouth 4 (four) times daily., Disp: , Rfl:  .  metoCLOPramide (REGLAN) 10 MG tablet, TAKE 1 TABLET BY MOUTH FOUR TIMES DAILY AT BEDTIME WITH MEALS, Disp: 120 tablet, Rfl: 0 .  mometasone (NASONEX) 50 MCG/ACT nasal spray, Place 2 sprays into the nose 2 (two) times daily., Disp: 17 g, Rfl: 5 .  montelukast (SINGULAIR) 10 MG tablet, TAKE 1 TABLET BY MOUTH EVERY NIGHT AT BEDTIME, Disp: 90 tablet, Rfl: 1 .  Nebivolol HCl (BYSTOLIC) 20 MG TABS, 2 tabs by mouth per day, Disp: 180 tablet, Rfl: 3 .  NITROSTAT 0.4 MG SL tablet, PLACE 1 TABLET UNDER TONGUE EVERY 5 MINUTES AS NEEDED FOR CHEST PAIN, MAX OF 3 TABLETS THEN CALL 911 IF PAIN PERSISTS, Disp: 100 tablet, Rfl: 0 .  ondansetron (ZOFRAN-ODT) 4 MG disintegrating tablet,  DISSOLVE 1 TABLET(4 MG) ON THE TONGUE EVERY 8 HOURS AS NEEDED FOR NAUSEA OR VOMITING, Disp: 20 tablet, Rfl: 3 .  OXYGEN, Use CPAP at bedtime, Disp: , Rfl:  .  potassium chloride SA (K-DUR,KLOR-CON) 20 MEQ tablet, Take 1 tablet (20 mEq total) by mouth 3 (three) times daily., Disp: 90 tablet, Rfl: 5 .  primidone (MYSOLINE) 50 MG tablet, Take by mouth., Disp: , Rfl:  .  Respiratory Therapy Supplies (FLUTTER) DEVI, Use as directed, Disp: 1 each, Rfl: 0 .  spironolactone (ALDACTONE) 25 MG tablet, Take 1 tablet (25 mg total) by mouth daily., Disp: 90 tablet, Rfl: 3 .  SYMBICORT 160-4.5 MCG/ACT inhaler, INHALE 2 PUFFS BY MOUTH TWICE DAILY, Disp: 10.2 g, Rfl: 5 .  topiramate (TOPAMAX) 25 MG tablet, Take 25 mg by mouth 2 (two) times daily., Disp: , Rfl:  .  traMADol (ULTRAM) 50 MG tablet, TAKE 2 TABLETS BY MOUTH EVERY 4 HOURS AS NEEDED FOR COUGH, Disp: 180 tablet, Rfl: 0 .  traZODone (DESYREL) 150 MG tablet, , Disp: , Rfl: 1 .  VENTOLIN HFA 108 (90 Base) MCG/ACT inhaler, INHALE 2 PUFFS INTO THE LUNGS EVERY 4 HOURS AS NEEDED FOR WHEEZING OR SHORTNESS OF BREATH, Disp: 18 g, Rfl: 5 No current facility-administered medications for this visit.   Facility-Administered Medications Ordered in Other Visits:  .  goserelin (ZOLADEX) injection 3.6 mg, 3.6 mg, Subcutaneous, Once, Chauncey Cruel, MD   Family History  Family History  Problem Relation Age of Onset  . Other Mother 22    breast calcifications treated with surgery, breast cancer pill, and radiation  . Fibroids Sister 42    s/p TAH-BSO  . Diabetes Maternal Grandmother   . Heart disease Maternal Grandmother   . Hypertension Maternal Grandmother   . Breast cancer Other     maternal great aunt (MGM's sister)  . Breast cancer Other     paternal great aunt dx middle ages; s/p mastectomy  . Cancer Neg Hx   . Alcohol abuse Neg Hx   . Early death Neg Hx   . Hyperlipidemia Neg Hx   . Kidney disease Neg Hx   . Stroke Neg Hx   . Colon cancer Neg Hx      Social History  Social History   Social History  . Marital status: Single    Spouse name: N/A  . Number of children: 3  .  Years of education: N/A   Occupational History  .  Unemployed   Social History Main Topics  . Smoking status: Never Smoker  . Smokeless tobacco: Never Used  . Alcohol use 0.0 oz/week     Comment: occasionally/socially  . Drug use: No  . Sexual activity: Yes    Birth control/ protection: Surgical   Other Topics Concern  . Not on file   Social History Narrative   She lives with four children.   She is currently not working (disbaility pending).           Review of Systems General:  No chills, fever, night sweats or weight changes.  Cardiovascular:  No chest pain, dyspnea on exertion, edema, orthopnea, palpitations, paroxysmal nocturnal dyspnea. Dermatological: No rash, lesions/masses Respiratory: No cough, dyspnea Urologic: No hematuria, dysuria Abdominal:   No nausea, vomiting, diarrhea, bright red blood per rectum, melena, or hematemesis Neurologic:  No visual changes, wkns, changes in mental status. All other systems reviewed and are otherwise negative except as noted above.  Physical Exam Blood pressure 130/90, pulse 100, height 5\' 6"  (1.676 m), weight 186 lb (84.4 kg).  General: Pleasant, NAD Psych: Normal affect. Neuro: Alert and oriented X 3. Moves all extremities spontaneously. HEENT: Normal  Neck: Supple without bruits or JVD. Lungs:  Resp regular and unlabored, CTA. Heart: RRR no s3, s4, or murmurs. Abdomen: Soft, non-tender, non-distended, BS + x 4.  Extremities: No clubbing, cyanosis or edema. DP/PT/Radials 2+ and equal bilaterally.  Lipid Panel     Component Value Date/Time   CHOL 206 (H) 06/15/2015 1645   TRIG 110.0 06/15/2015 1645   HDL 44.10 06/15/2015 1645   CHOLHDL 5 06/15/2015 1645   VLDL 22.0 06/15/2015 1645   LDLCALC 140 (H) 06/15/2015 1645   Nuclear stress test: 02/15/2013 Impression  Exercise Capacity:  Lexiscan with no exercise.  BP Response: Hypertensive blood pressure response.  Clinical Symptoms: Chest pain, headache.  ECG Impression: No significant ST segment change suggestive of ischemia.  Comparison with Prior Nuclear Study: No images to compare  Overall Impression: Normal stress nuclear study.  LV Ejection Fraction: 54%. LV Wall Motion: NL LV Function; NL Wall Motion  Loralie Champagne  02/12/2013   TTE 06/17/2013  Left ventricle: The cavity size was normal. There was mild concentric hypertrophy. Systolic function was normal. The estimated ejection fraction was in the range of 60% to 65%. Wall motion was normal; there were no regional wall motion abnormalities. Features are consistent with a pseudonormal left ventricular filling pattern, with concomitant abnormal relaxation and increased filling pressure (grade 2 diastolic dysfunction). Doppler parameters are consistent with high ventricular filling pressure. Impressions: - When compared to 2010, EF has improved.  Accessory Clinical Findings  ECG - normal sinus rhythm, 100 beats per minute, normal EKG.    Assessment & Plan  40 year old female with prior medical history of hyperlipidemia, hypertension and breast carcinoma with chemotherapy that can potentially cause heart block, heart failure and angina.  1.  Acute bronchitis - we will start a Z-Pak for 4 days, advised to use Mucinex - dextromethorphan to suppress cough, she is encouraged to continue using her inhalers stay hydrated as her heart rate is 100.  We will follow we will follow her in 2 weeks and if no improvement possibly consider a pericarditis.  2. Hypertensive heart disease with CHF - her blood pressure now better controlled, borderline diastolic hypertension in our office we'll continue the same medications.  In the past we have tested for  secondary causes of hypertension including renal arterial ultrasound that was normal she was also negative for  carcinoid syndrome and pheochromocytoma.  3. Acute on chronic diastolic CHF, associated with dyspnea on exertion - A lexiscan nuclear stress test showed poor exercise capacity, LV EF 54% and no perfusion defect.  Echocardiogram showed preserved LV function it is improved compared to prior echo however she has pseudo-normal pattern of diastolic dysfunction with elevated filling pressures. She is now euvolemic.  4. History of acute kidney failure now creatinine is to normal.  5. Recurrent syncope -  E-cardio monitor showed only sinus rhythm. This is possibly due to narcolepsy, sleep study showed severe sleep apnea, she is non-complaint with CPAP machine. Strongly advised to call sleep center for her repeat sleep study.  Advised to take it easy after standing up, to avoid orthostatic hypotension. The problem is that she is hypertensive despite several antihypertensives including vasodilators that are most probably contributing to her symptoms. Unable to wean off.   6. OSA - severe, non-complaint with CPAP, as above  7. Hyperlipidemia - with LDL of 140, she strongly advised to change her diet.  Follow up in 6 months.  Ena Dawley, MD 01/05/2016, 9:24 AM

## 2016-01-07 NOTE — Progress Notes (Signed)
GENETIC TEST RESULT  HPI: [name] was previously seen in the [name] due to a personal history of triple negative breast cancer at 81, family history of breast cancer, and concerns regarding a hereditary predisposition to cancer. Please refer to our prior cancer genetics clinic note from 12/07/15 for more information regarding Erica [name]'s medical, social and family histories, and our assessment and recommendations, at the time. Erica [name] recent genetic test results were disclosed to her, as were recommendations warranted by these results. These results and recommendations are discussed in more detail below.  GENETIC TEST RESULTS: At the time of Erica [name]'s visit on 12/07/15, we recommended she pursue genetic testing of the 20-gene Breast/Ovarian Cancer Panel through [name].  The Breast/Ovarian Cancer Panel offered by Erica [name] Erica Pigeon, Erica [name]) includes sequencing and deletion/duplication analysis for the following 19 genes:  ATM, BARD1, BRCA1, BRCA2, BRIP1, CDH1, CHEK2, FANCC, MLH1, MSH2, MSH6, NBN, PALB2, PMS2, PTEN, RAD51C, RAD51D, TP53, and XRCC2.  This panel also includes deletion/duplication analysis (without sequencing) for one gene, EPCAM.  Those results are now back, the report date for which is December 19, 2015.  Genetic testing was normal, and did not reveal a deleterious mutation in these genes. Additionally, no variants of uncertain significance (VUSes) were found.  The test report will be scanned into EPIC and will be located under the Results Review tab in the Pathology>Molecular Pathology section.   We discussed with Erica [name] that since the current genetic testing is not perfect, it is possible there may be a gene mutation in one of these genes that current testing cannot detect, but that chance is small. We also discussed, that it is possible that another gene that has not yet been discovered, or that we have not yet tested, is  responsible for the cancer diagnoses in the family, and it is, therefore, important to remain in touch with cancer genetics in the future so that we can continue to offer Erica [name] the most up-to-date genetic testing.   CANCER SCREENING RECOMMENDATIONS: Thus, we still do not have an explanation for Erica [name]'s personal history of breast cancer. This result may be reassuring and indicate that Erica [name] likely does not have an increased risk for a future cancer due to a mutation in one of these genes. This normal test also suggests that Erica [name]'s cancer was most likely not due to an inherited predisposition associated with one of these genes.  Most cancers happen by chance and this negative test could suggest that her cancer falls into this category.  We, therefore, recommended she continue to follow the cancer management and screening guidelines provided by her oncology and primary healthcare providers.   RECOMMENDATIONS FOR FAMILY MEMBERS: Women in this family might be at some increased risk of developing cancer, over the general population risk, simply due to the family history of cancer. We recommended women in this family have a yearly mammogram beginning at age 18, or 69 years younger than the earliest onset of cancer, an annual clinical breast exam, and perform monthly breast self-exams. Thus, Erica [name]'s daughters and nieces should begin annual mammogram screening at the age of 61 due to her history of early-onset breast cancer.  Women in this family should also have a gynecological exam as recommended by their primary provider. All family members should have a colonoscopy by age 64.  FOLLOW-UP: Lastly, we discussed with Erica [name] that cancer genetics is a rapidly advancing field and it is possible that  new genetic tests will be appropriate for her and/or her family members in the future. We encouraged her to remain in contact with cancer genetics on an annual basis so we can update her personal and  family histories and let her know of advances in cancer genetics that may benefit this family.   Our contact number was provided. Erica [name] questions were answered to her satisfaction, and she knows she is welcome to call us at anytime with additional questions or concerns.   Erica Luz, MS, [facility] Certified Genetic Counselor [email or web address] Phone: [phone]

## 2016-01-08 ENCOUNTER — Encounter: Payer: Self-pay | Admitting: Physician Assistant

## 2016-01-09 ENCOUNTER — Ambulatory Visit (INDEPENDENT_AMBULATORY_CARE_PROVIDER_SITE_OTHER): Payer: Medicare Other | Admitting: Internal Medicine

## 2016-01-09 ENCOUNTER — Encounter: Payer: Self-pay | Admitting: Internal Medicine

## 2016-01-09 VITALS — BP 142/92 | HR 99 | Temp 98.7°F | Resp 16 | Ht 66.0 in | Wt 187.2 lb

## 2016-01-09 DIAGNOSIS — R058 Other specified cough: Secondary | ICD-10-CM

## 2016-01-09 DIAGNOSIS — Z79899 Other long term (current) drug therapy: Secondary | ICD-10-CM | POA: Diagnosis not present

## 2016-01-09 DIAGNOSIS — R05 Cough: Secondary | ICD-10-CM | POA: Diagnosis not present

## 2016-01-09 DIAGNOSIS — J45991 Cough variant asthma: Secondary | ICD-10-CM | POA: Diagnosis not present

## 2016-01-09 DIAGNOSIS — I10 Essential (primary) hypertension: Secondary | ICD-10-CM

## 2016-01-09 MED ORDER — HYDROCODONE-HOMATROPINE 5-1.5 MG/5ML PO SYRP: 5.0000 mL | ORAL_SOLUTION | Freq: Three times a day (TID) | ORAL | 0 refills | Status: DC | PRN

## 2016-01-09 NOTE — Progress Notes (Signed)
Pre visit review using our clinic review tool, if applicable. No additional management support is needed unless otherwise documented below in the visit note. 

## 2016-01-09 NOTE — Progress Notes (Signed)
Subjective:  Patient ID: Erica [name], female    DOB: [birth year]  Age: 40 y.o. MRN: [id number]  CC: Cough and Hypertension   HPI Erica [name] presents for a blood pressure check and follow-up regarding chronic recurrent NP cough. She denies shortness of breath but does occasionally have wheezing. The cough has not been suppressed by dextromethorphan, tramadol, or chlorpheniramine. The cough keeps her awake at night and interferes with her activities during the day. She is getting some symptom relief with her current inhalers.   She tells me her blood pressures been relatively well controlled at home. She has had no recent episodes of headache, blurred vision, chest pain, palpitations, DOE, or fatigue.  Outpatient Medications Prior to Visit  Medication Sig Dispense Refill  . acetaZOLAMIDE (DIAMOX) 250 MG tablet Take 4 tablets (1,000 mg total) by mouth 3 (three) times daily. 400 tablet 11  . albuterol (PROVENTIL) (2.5 MG/3ML) 0.083% nebulizer solution USE 1 VIAL VIA NEBULIZER EVERY 4 HOURS AS NEEDED( NEED APPOINTMENT) 300 mL 2  . amitriptyline (ELAVIL) 100 MG tablet Take 1 tablet (100 mg total) by mouth at bedtime. 90 tablet 1  . amLODipine (NORVASC) 10 MG tablet TAKE 1 TABLET(10 MG) BY MOUTH DAILY 90 tablet 0  . Azilsartan-Chlorthalidone (EDARBYCLOR) 40-25 MG TABS Take 1 tablet by mouth daily. 21 tablet 0  . beclomethasone (QVAR) 80 MCG/ACT inhaler Inhale 2 puffs into the lungs 2 (two) times daily. 1 Inhaler 5  . Biotin 10 MG TABS Take 1 tablet by mouth every morning.     . cloNIDine (CATAPRES) 0.3 MG tablet TAKE 1 TABLET BY MOUTH TWICE DAILY 180 tablet 3  . dextromethorphan-guaiFENesin (MUCINEX DM) 30-600 MG 12hr tablet Take 1 tablet by mouth 2 (two) times daily. 30 tablet 0  . diltiazem (CARDIZEM LA) 420 MG 24 hr tablet Take 1 tablet (420 mg total) by mouth daily. 90 tablet 3  . DULoxetine (CYMBALTA) 60 MG capsule Take 1 capsule (60 mg total) by mouth daily. 90 capsule 3  . EPINEPHrine  0.3 mg/0.3 mL IJ SOAJ injection Inject 0.3 mLs (0.3 mg total) into the muscle once as needed (anaphylaxis reaction). 2 Device 5  . Eszopiclone 3 MG TABS Take 1 tablet (3 mg total) by mouth at bedtime as needed. 30 tablet 3  . fluocinonide-emollient (LIDEX-E) 0.05 % cream APPLY EXTERNALLY TO THE AFFECTED AREA TWICE DAILY 60 g 0  . hydrALAZINE (APRESOLINE) 100 MG tablet TAKE 1 TABLET(100 MG) BY MOUTH THREE TIMES DAILY 90 tablet 0  . isosorbide mononitrate (IMDUR) 60 MG 24 hr tablet Take 60 mg by mouth every morning.    . lidocaine (XYLOCAINE) 5 % ointment Apply 1 application topically 2 (two) times daily as needed. 50 g 5  . mometasone (NASONEX) 50 MCG/ACT nasal spray Place 2 sprays into the nose 2 (two) times daily. 17 g 5  . montelukast (SINGULAIR) 10 MG tablet TAKE 1 TABLET BY MOUTH EVERY NIGHT AT BEDTIME 90 tablet 1  . Nebivolol HCl (BYSTOLIC) 20 MG TABS 2 tabs by mouth per day 180 tablet 3  . NITROSTAT 0.4 MG SL tablet PLACE 1 TABLET UNDER TONGUE EVERY 5 MINUTES AS NEEDED FOR CHEST PAIN, MAX OF 3 TABLETS THEN CALL 911 IF PAIN PERSISTS 100 tablet 0  . OXYGEN Use CPAP at bedtime    . potassium chloride SA (K-DUR,KLOR-CON) 20 MEQ tablet Take 1 tablet (20 mEq total) by mouth 3 (three) times daily. 90 tablet 5  . primidone (MYSOLINE) 50 MG tablet Take  by mouth.    . Respiratory Therapy Supplies (FLUTTER) DEVI Use as directed 1 each 0  . spironolactone (ALDACTONE) 25 MG tablet Take 1 tablet (25 mg total) by mouth daily. 90 tablet 3  . SYMBICORT 160-4.5 MCG/ACT inhaler INHALE 2 PUFFS BY MOUTH TWICE DAILY 10.2 g 5  . topiramate (TOPAMAX) 25 MG tablet Take 25 mg by mouth 2 (two) times daily.    . traZODone (DESYREL) 150 MG tablet   1  . VENTOLIN HFA 108 (90 Base) MCG/ACT inhaler INHALE 2 PUFFS INTO THE LUNGS EVERY 4 HOURS AS NEEDED FOR WHEEZING OR SHORTNESS OF BREATH 18 g 5  . chlorpheniramine (CHLOR-TRIMETON) 4 MG tablet Take 8 mg by mouth at bedtime.    Marland Kitchen DEXILANT 60 MG capsule TAKE ONE CAPSULE BY  MOUTH DAILY 30 capsule 0  . dexlansoprazole (DEXILANT) 60 MG capsule Take 60 mg by mouth daily before breakfast.     . hyoscyamine (LEVSIN SL) 0.125 MG SL tablet Place under the tongue.    Marland Kitchen LORazepam (ATIVAN) 0.5 MG tablet Take 1 tablet (0.5 mg total) by mouth every 8 (eight) hours as needed. for anxiety 90 tablet 0  . LYRICA 200 MG capsule TAKE ONE CAPSULE BY MOUTH THREE TIMES DAILY 90 capsule 0  . metoCLOPramide (REGLAN) 10 MG tablet Take 10 mg by mouth 4 (four) times daily.    . metoCLOPramide (REGLAN) 10 MG tablet TAKE 1 TABLET BY MOUTH FOUR TIMES DAILY AT BEDTIME WITH MEALS 120 tablet 0  . ondansetron (ZOFRAN-ODT) 4 MG disintegrating tablet DISSOLVE 1 TABLET(4 MG) ON THE TONGUE EVERY 8 HOURS AS NEEDED FOR NAUSEA OR VOMITING 20 tablet 3  . traMADol (ULTRAM) 50 MG tablet TAKE 2 TABLETS BY MOUTH EVERY 4 HOURS AS NEEDED FOR COUGH 180 tablet 0  . azithromycin (ZITHROMAX Z-PAK) 250 MG tablet Take 1 tablet (250 mg total) by mouth daily. Take 2 tablets (500 mg total) today as your 1st initial dose, then take 1 tab daily thereafter. 6 each 0   Facility-Administered Medications Prior to Visit  Medication Dose Route Frequency Provider Last Rate Last Dose  . goserelin (ZOLADEX) injection 3.6 mg  3.6 mg Subcutaneous Once Chauncey Cruel, MD        ROS Review of Systems  Constitutional: Negative for activity change, chills, diaphoresis, fatigue, fever and unexpected weight change.  HENT: Negative.   Eyes: Negative.  Negative for visual disturbance.  Respiratory: Positive for cough and wheezing. Negative for choking, chest tightness, shortness of breath and stridor.   Cardiovascular: Negative.  Negative for chest pain, palpitations and leg swelling.  Gastrointestinal: Negative.  Negative for abdominal pain, constipation, diarrhea, nausea and vomiting.  Endocrine: Negative.   Genitourinary: Negative.  Negative for difficulty urinating.  Musculoskeletal: Negative.  Negative for back pain, joint  swelling, myalgias and neck pain.  Skin: Negative.  Negative for color change and rash.  Allergic/Immunologic: Negative.   Neurological: Negative.  Negative for dizziness, syncope, weakness, light-headedness, numbness and headaches.  Hematological: Negative.  Negative for adenopathy. Does not bruise/bleed easily.  Psychiatric/Behavioral: Negative.     Objective:  BP (!) 142/92 (BP Location: Right Arm, Patient Position: Sitting, Cuff Size: Normal)   Pulse 99   Temp 98.7 F (37.1 C) (Oral)   Resp 16   Ht 5\' 6"  (1.676 m)   Wt 187 lb 4 oz (84.9 kg)   SpO2 98%   BMI 30.22 kg/m   BP Readings from Last 3 Encounters:  01/10/16 (!) 160/98  01/09/16 Marland Kitchen)  142/92  01/05/16 130/90    Wt Readings from Last 3 Encounters:  01/10/16 188 lb (85.3 kg)  01/09/16 187 lb 4 oz (84.9 kg)  01/05/16 186 lb (84.4 kg)    Physical Exam  Constitutional: She is oriented to person, place, and time. She appears well-developed and well-nourished.  Non-toxic appearance. She does not have a sickly appearance. She does not appear ill. No distress.  HENT:  Mouth/Throat: Oropharynx is clear and moist. No oropharyngeal exudate.  Eyes: Conjunctivae are normal. Right eye exhibits no discharge. Left eye exhibits no discharge. No scleral icterus.  Neck: Normal range of motion. Neck supple. No JVD present. No tracheal deviation present. No thyromegaly present.  Cardiovascular: Normal rate, regular rhythm, normal heart sounds and intact distal pulses.  Exam reveals no gallop and no friction rub.   No murmur heard. Pulmonary/Chest: Effort normal. No stridor. No tachypnea. No respiratory distress. She has no decreased breath sounds. She has no wheezes. She has rhonchi in the right middle field and the left middle field. She has no rales. She exhibits no tenderness.  Abdominal: Soft. Bowel sounds are normal. She exhibits no distension and no mass. There is no tenderness. There is no rebound and no guarding.    Musculoskeletal: Normal range of motion. She exhibits no edema, tenderness or deformity.  Lymphadenopathy:    She has no cervical adenopathy.  Neurological: She is oriented to person, place, and time.  Skin: Skin is warm and dry. No rash noted. She is not diaphoretic. No erythema. No pallor.  Psychiatric: She has a normal mood and affect. Her behavior is normal. Judgment and thought content normal.  Vitals reviewed.   Lab Results  Component Value Date   WBC 7.5 12/26/2015   HGB 13.6 12/26/2015   HCT 41.8 12/26/2015   PLT 239 12/26/2015   GLUCOSE 110 12/26/2015   CHOL 206 (H) 06/15/2015   TRIG 110.0 06/15/2015   HDL 44.10 06/15/2015   LDLCALC 140 (H) 06/15/2015   ALT 20 12/26/2015   AST 17 12/26/2015   NA 143 12/26/2015   K 3.5 12/26/2015   CL 102 12/05/2015   CREATININE 1.0 12/26/2015   BUN 9.1 12/26/2015   CO2 27 12/26/2015   TSH 1.06 06/15/2015   INR 1.1 (H) 06/03/2012   HGBA1C 5.6 08/22/2015    Mm Diag Breast Tomo Bilateral  Result Date: 11/06/2015 CLINICAL DATA:  Left lumpectomy.  Annual mammography. EXAM: 2D DIGITAL DIAGNOSTIC BILATERAL MAMMOGRAM WITH CAD AND ADJUNCT TOMO COMPARISON:  Previous exam(s). ACR Breast Density Category b: There are scattered areas of fibroglandular density. FINDINGS: Stable lumpectomy changes on the left. No suspicious abnormalities to suggest malignancy in either breast. Mammographic images were processed with CAD. IMPRESSION: No mammographic evidence of malignancy. RECOMMENDATION: Annual diagnostic mammography I have discussed the findings and recommendations with the patient. Results were also provided in writing at the conclusion of the visit. If applicable, a reminder letter will be sent to the patient regarding the next appointment. BI-RADS CATEGORY  2: Benign. Electronically Signed   By: Dorise Bullion III M.D   On: 11/06/2015 13:43    Assessment & Plan:   Elfie was seen today for cough and hypertension.  Diagnoses and all orders  for this visit:  Cough variant asthma- will suppress the cough with Hycodan as needed. She will continue to follow-up with pulmonary and will continue her current inhalers. -     HYDROcodone-homatropine (HYCODAN) 5-1.5 MG/5ML syrup; Take 5 mLs by mouth every 8 (eight)  hours as needed for cough.  Upper airway cough syndrome -     HYDROcodone-homatropine (HYCODAN) 5-1.5 MG/5ML syrup; Take 5 mLs by mouth every 8 (eight) hours as needed for cough.  Essential hypertension- her blood pressure is adequately well controlled   I have discontinued Erica [name]'s chlorpheniramine, metoCLOPramide, LYRICA, traMADol, LORazepam, and azithromycin. I am also having her start on HYDROcodone-homatropine. Additionally, I am having her maintain her Biotin, SYMBICORT, FLUTTER, montelukast, diltiazem, OXYGEN, mometasone, beclomethasone, albuterol, isosorbide mononitrate, topiramate, cloNIDine, EPINEPHrine, spironolactone, amLODipine, NITROSTAT, DULoxetine, lidocaine, VENTOLIN HFA, primidone, amitriptyline, Nebivolol HCl, traZODone, Azilsartan-Chlorthalidone, potassium chloride SA, fluocinonide-emollient, acetaZOLAMIDE, Eszopiclone, hydrALAZINE, and dextromethorphan-guaiFENesin.  Meds ordered this encounter  Medications  . HYDROcodone-homatropine (HYCODAN) 5-1.5 MG/5ML syrup    Sig: Take 5 mLs by mouth every 8 (eight) hours as needed for cough.    Dispense:  120 mL    Refill:  0     Follow-up: Return in about 6 months (around 07/09/2016).  Scarlette Calico, MD

## 2016-01-09 NOTE — Patient Instructions (Signed)
Hypertension Hypertension, commonly called high blood pressure, is when the force of blood pumping through your arteries is too strong. Your arteries are the blood vessels that carry blood from your heart throughout your body. A blood pressure reading consists of a higher number over a lower number, such as 110/72. The higher number (systolic) is the pressure inside your arteries when your heart pumps. The lower number (diastolic) is the pressure inside your arteries when your heart relaxes. Ideally you want your blood pressure below 120/80. Hypertension forces your heart to work harder to pump blood. Your arteries may become narrow or stiff. Having untreated or uncontrolled hypertension can cause heart attack, stroke, kidney disease, and other problems. RISK FACTORS Some risk factors for high blood pressure are controllable. Others are not.  Risk factors you cannot control include:   Race. You may be at higher risk if you are African American.  Age. Risk increases with age.  Gender. Men are at higher risk than women before age 45 years. After age 65, women are at higher risk than men. Risk factors you can control include:  Not getting enough exercise or physical activity.  Being overweight.  Getting too much fat, sugar, calories, or salt in your diet.  Drinking too much alcohol. SIGNS AND SYMPTOMS Hypertension does not usually cause signs or symptoms. Extremely high blood pressure (hypertensive crisis) may cause headache, anxiety, shortness of breath, and nosebleed. DIAGNOSIS To check if you have hypertension, your health care provider will measure your blood pressure while you are seated, with your arm held at the level of your heart. It should be measured at least twice using the same arm. Certain conditions can cause a difference in blood pressure between your right and left arms. A blood pressure reading that is higher than normal on one occasion does not mean that you need treatment. If  it is not clear whether you have high blood pressure, you may be asked to return on a different day to have your blood pressure checked again. Or, you may be asked to monitor your blood pressure at home for 1 or more weeks. TREATMENT Treating high blood pressure includes making lifestyle changes and possibly taking medicine. Living a healthy lifestyle can help lower high blood pressure. You may need to change some of your habits. Lifestyle changes may include:  Following the DASH diet. This diet is high in fruits, vegetables, and whole grains. It is low in salt, red meat, and added sugars.  Keep your sodium intake below 2,300 mg per day.  Getting at least 30-45 minutes of aerobic exercise at least 4 times per week.  Losing weight if necessary.  Not smoking.  Limiting alcoholic beverages.  Learning ways to reduce stress. Your health care provider may prescribe medicine if lifestyle changes are not enough to get your blood pressure under control, and if one of the following is true:  You are 18-59 years of age and your systolic blood pressure is above 140.  You are 60 years of age or older, and your systolic blood pressure is above 150.  Your diastolic blood pressure is above 90.  You have diabetes, and your systolic blood pressure is over 140 or your diastolic blood pressure is over 90.  You have kidney disease and your blood pressure is above 140/90.  You have heart disease and your blood pressure is above 140/90. Your personal target blood pressure may vary depending on your medical conditions, your age, and other factors. HOME CARE INSTRUCTIONS    Have your blood pressure rechecked as directed by your health care provider.   Take medicines only as directed by your health care provider. Follow the directions carefully. Blood pressure medicines must be taken as prescribed. The medicine does not work as well when you skip doses. Skipping doses also puts you at risk for  problems.  Do not smoke.   Monitor your blood pressure at home as directed by your health care provider. SEEK MEDICAL CARE IF:   You think you are having a reaction to medicines taken.  You have recurrent headaches or feel dizzy.  You have swelling in your ankles.  You have trouble with your vision. SEEK IMMEDIATE MEDICAL CARE IF:  You develop a severe headache or confusion.  You have unusual weakness, numbness, or feel faint.  You have severe chest or abdominal pain.  You vomit repeatedly.  You have trouble breathing. MAKE SURE YOU:   Understand these instructions.  Will watch your condition.  Will get help right away if you are not doing well or get worse.   This information is not intended to replace advice given to you by your health care provider. Make sure you discuss any questions you have with your health care provider.   Document Released: 03/18/2005 Document Revised: 08/02/2014 Document Reviewed: 01/08/2013 Elsevier Interactive Patient Education 2016 [employer]  

## 2016-01-10 ENCOUNTER — Ambulatory Visit (INDEPENDENT_AMBULATORY_CARE_PROVIDER_SITE_OTHER): Payer: Medicare Other | Admitting: Internal Medicine

## 2016-01-10 ENCOUNTER — Encounter: Payer: Self-pay | Admitting: Internal Medicine

## 2016-01-10 ENCOUNTER — Encounter: Payer: Self-pay | Admitting: Cardiology

## 2016-01-10 VITALS — BP 160/98 | HR 88 | Ht 66.0 in | Wt 188.0 lb

## 2016-01-10 DIAGNOSIS — R110 Nausea: Secondary | ICD-10-CM | POA: Diagnosis not present

## 2016-01-10 DIAGNOSIS — K219 Gastro-esophageal reflux disease without esophagitis: Secondary | ICD-10-CM

## 2016-01-10 DIAGNOSIS — K3184 Gastroparesis: Secondary | ICD-10-CM

## 2016-01-10 MED ORDER — DEXLANSOPRAZOLE 60 MG PO CPDR: 1.0000 | DELAYED_RELEASE_CAPSULE | Freq: Every day | ORAL | 5 refills | Status: DC

## 2016-01-10 MED ORDER — METOCLOPRAMIDE HCL 10 MG PO TABS: ORAL_TABLET | ORAL | 5 refills | Status: DC

## 2016-01-10 MED ORDER — DICYCLOMINE HCL 20 MG PO TABS: 20.0000 mg | ORAL_TABLET | Freq: Three times a day (TID) | ORAL | 2 refills | Status: DC | PRN

## 2016-01-10 MED ORDER — PROMETHAZINE HCL 12.5 MG PO TABS: ORAL_TABLET | ORAL | 2 refills | Status: DC

## 2016-01-10 NOTE — Patient Instructions (Signed)
Please discontinue Levsin (hyoscyamine)  Discontinue Zofran (ondansentron).  We have sent the following medications to your pharmacy for you to pick up at your convenience: Bentyl 20 mg three times daily as needed (in place of levsin) Phenergan 12.5 mg-25 mg every 8 hours as needed (in plasce of zofran)-Beware of possible sedation  Continue Reglan and Dexilant.  Follow up with Dr Hilarie Fredrickson in 1 year.  If you are age 41 or older, your body mass index should be between 23-30. Your Body mass index is 30.34 kg/m. If this is out of the aforementioned range listed, please consider follow up with your Primary Care Provider.  If you are age 22 or younger, your body mass index should be between 19-25. Your Body mass index is 30.34 kg/m. If this is out of the aformentioned range listed, please consider follow up with your Primary Care Provider.

## 2016-01-10 NOTE — Progress Notes (Signed)
Subjective:    Patient ID: Erica [name], female    DOB: [birth year], 40 y.o.   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 She was last seen in May 2016. She reports that she has been struggling with nausea on a daily basis. This gets worse after eating. She continues Reglan 10 mg 3 times a day before meals and at bedtime which she states helped tremendously and without it she frequently vomits. Even with the Reglan she still has nausea not relieved by Zofran. She's tried both for an 8 mg doses. She continues Dexon and 60 mg daily and doesn't have much heartburn. Bowel movements are been regular without blood or melena. When she does have abdominal pain it's in her chronic left upper quadrant location. She's using some Levsin which helps but she states Levsin is no longer covered by her insurance. At times her nausea worsens with stress. She has plans for a VP shunt to be placed at [facility] for her elevated ventricular pressure.  Review of Systems As per history of present illness, otherwise negative  Current Medications, Allergies, Past Medical History, Past Surgical History, Family History and Social History were reviewed in [employer] record.     Objective:   Physical Exam BP (!) 160/98 (BP Location: Right Arm, Patient Position: Sitting, Cuff Size: Normal)   Pulse 88   Ht 5\' 6"  (1.676 m)   Wt 188 lb (85.3 kg)   BMI 30.34 kg/m  Constitutional: Well-developed and well-nourished. No distress. HEENT: Normocephalic and atraumatic. Oropharynx is clear and moist. No oropharyngeal exudate. Conjunctivae are normal.  No scleral icterus. Neck: Neck supple. Trachea midline. Cardiovascular: Normal rate, regular rhythm and intact distal pulses. No  M/R/G Pulmonary/chest: Effort normal and breath sounds normal. No wheezing, rales or rhonchi. Abdominal: Soft, nontender, nondistended. Bowel sounds active throughout. There are no masses palpable. No hepatosplenomegaly. Extremities: no clubbing, cyanosis, or edema Lymphadenopathy: No cervical adenopathy noted. Neurological: Alert and oriented to person place and time. Skin: Skin is warm and dry. No rashes noted. Psychiatric: Normal mood and affect. Behavior is normal.  CBC    Component Value Date/Time   WBC 7.5 12/26/2015 1508   WBC 6.4 11/17/2015 1422   RBC 4.70 12/26/2015 1508   RBC 4.74 11/17/2015 1422   HGB 13.6 12/26/2015 1508   HCT 41.8 12/26/2015 1508   PLT 239 12/26/2015 1508   MCV 89.0 12/26/2015 1508   MCH 28.8 12/26/2015 1508   MCH 29.1 06/09/2015 2217   MCHC 32.4 12/26/2015 1508   MCHC 33.5 11/17/2015 1422   RDW 14.9 (H) 12/26/2015 1508   LYMPHSABS 1.7 12/26/2015 1508   MONOABS 0.4 12/26/2015 1508   EOSABS 0.1 12/26/2015 1508   BASOSABS 0.0 12/26/2015 1508   CMP     Component Value Date/Time   NA 143 12/26/2015 1508   K 3.5 12/26/2015 1508   CL 102 12/05/2015 1348   CL 103 09/10/2012 1618   CO2 27 12/26/2015 1508   GLUCOSE 110 12/26/2015 1508   GLUCOSE 86 09/10/2012 1618   BUN 9.1 12/26/2015 1508   CREATININE 1.0 12/26/2015 1508   CALCIUM 9.5 12/26/2015 1508   PROT 7.5 12/26/2015 1508   ALBUMIN 3.4 (L) 12/26/2015 1508   AST 17 12/26/2015 1508   ALT 20 12/26/2015 1508   ALKPHOS  79 12/26/2015 1508   BILITOT 0.34 12/26/2015 1508   GFRNONAA >60 06/09/2015 2217   GFRAA >60 06/09/2015 2217       Assessment & Plan:  40 year old female with history of GERD, gastroparesis, PD, breast cancer status post chemotherapy and radiation, hypertension, pseudotumor cerebri, peripheral neuropathy, 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  1. Gastroparesis/GERD -- she continues to struggle with nausea, some of which may be related to pseudotumor cerebri.  Hopefully her VP shunt will help with nausea. She feels that Reglan is an essential medication for her and she cannot currently afford domperidone. We again discussed the risk of long-term metoclopramide therapy included neurologic complications such as tardive dyskinesia. She understands these risks and wishes to continue this medication. We will continue 10 mg 3 times a day before meals and at bedtime for now. I recommended that she skip doses if they are not needed and use a drug even once or twice daily if she can get by with it. Continue Dexilant 60 mg daily for reflux disease. Change Levsin to Bentyl 20 mg 3 times a day when necessary for crampy abdominal pain. Given lack of response to Zofran and will change her to Phenergan 12-25 mg every 8 hours as needed for nausea. We discussed that this medication can be sedating and that she should not drive or operate heavy machinery while using it. She voiced understanding  She can follow annually but sooner if symptoms persist or worsen. 25 minutes spent with the patient today. Greater than 50% was spent in counseling and coordination of care with the patient

## 2016-01-11 ENCOUNTER — Other Ambulatory Visit: Payer: Self-pay | Admitting: Internal Medicine

## 2016-01-12 ENCOUNTER — Encounter: Payer: Self-pay | Admitting: Oncology

## 2016-01-15 ENCOUNTER — Other Ambulatory Visit: Payer: Self-pay | Admitting: Internal Medicine

## 2016-01-16 ENCOUNTER — Telehealth: Payer: Self-pay

## 2016-01-16 NOTE — Telephone Encounter (Signed)
LVM for pt to call back as soon as possible.   RE: Toxicology done. Came back negative for tramadol. PCP wants to know if pt is taking this medication.

## 2016-01-17 ENCOUNTER — Encounter: Payer: Self-pay | Admitting: Adult Health

## 2016-01-18 ENCOUNTER — Other Ambulatory Visit: Payer: Medicare Other

## 2016-01-18 ENCOUNTER — Ambulatory Visit (HOSPITAL_BASED_OUTPATIENT_CLINIC_OR_DEPARTMENT_OTHER): Payer: Medicare Other

## 2016-01-18 DIAGNOSIS — C50412 Malignant neoplasm of upper-outer quadrant of left female breast: Secondary | ICD-10-CM | POA: Diagnosis not present

## 2016-01-18 DIAGNOSIS — Z5111 Encounter for antineoplastic chemotherapy: Secondary | ICD-10-CM | POA: Diagnosis present

## 2016-01-18 MED ORDER — GOSERELIN ACETATE 3.6 MG ~~LOC~~ IMPL
3.6000 mg | DRUG_IMPLANT | Freq: Once | SUBCUTANEOUS | Status: AC
  Administered 2016-01-18: 3.6 mg via SUBCUTANEOUS
  Filled 2016-01-18: qty 3.6

## 2016-01-18 NOTE — Patient Instructions (Signed)
Goserelin injection What is this medicine? GOSERELIN (GOE se rel in) is similar to a hormone found in the body. It lowers the amount of sex hormones that the body makes. Men will have lower testosterone levels and women will have lower estrogen levels while taking this medicine. In men, this medicine is used to treat prostate cancer; the injection is either given once per month or once every 12 weeks. A once per month injection (only) is used to treat women with endometriosis, dysfunctional uterine bleeding, or advanced breast cancer. This medicine may be used for other purposes; ask your health care provider or pharmacist if you have questions. What should I tell my health care provider before I take this medicine? They need to know if you have any of these conditions (some only apply to women): -diabetes -heart disease or previous heart attack -high blood pressure -high cholesterol -kidney disease -osteoporosis or low bone density -problems passing urine -spinal cord injury -stroke -tobacco smoker -an unusual or allergic reaction to goserelin, hormone therapy, other medicines, foods, dyes, or preservatives -pregnant or trying to get pregnant -breast-feeding How should I use this medicine? This medicine is for injection under the skin. It is given by a health care professional in a hospital or clinic setting. Men receive this injection once every 4 weeks or once every 12 weeks. Women will only receive the once every 4 weeks injection. Talk to your pediatrician regarding the use of this medicine in children. Special care may be needed. Overdosage: If you think you have taken too much of this medicine contact a poison control center or emergency room at once. NOTE: This medicine is only for you. Do not share this medicine with others. What if I miss a dose? It is important not to miss your dose. Call your doctor or health care professional if you are unable to keep an appointment. What may  interact with this medicine? -female hormones like estrogen -herbal or dietary supplements like black cohosh, chasteberry, or DHEA -female hormones like testosterone -prasterone This list may not describe all possible interactions. Give your health care provider a list of all the medicines, herbs, non-prescription drugs, or dietary supplements you use. Also tell them if you smoke, drink alcohol, or use illegal drugs. Some items may interact with your medicine. What should I watch for while using this medicine? Visit your doctor or health care professional for regular checks on your progress. Your symptoms may appear to get worse during the first weeks of this therapy. Tell your doctor or healthcare professional if your symptoms do not start to get better or if they get worse after this time. Your bones may get weaker if you take this medicine for a long time. If you smoke or frequently drink alcohol you may increase your risk of bone loss. A family history of osteoporosis, chronic use of drugs for seizures (convulsions), or corticosteroids can also increase your risk of bone loss. Talk to your doctor about how to keep your bones strong. This medicine should stop regular monthly menstration in women. Tell your doctor if you continue to menstrate. Women should not become pregnant while taking this medicine or for 12 weeks after stopping this medicine. Women should inform their doctor if they wish to become pregnant or think they might be pregnant. There is a potential for serious side effects to an unborn child. Talk to your health care professional or pharmacist for more information. Do not breast-feed an infant while taking this medicine. Men should   inform their doctors if they wish to father a child. This medicine may lower sperm counts. Talk to your health care professional or pharmacist for more information. What side effects may I notice from receiving this medicine? Side effects that you should  report to your doctor or health care professional as soon as possible: -allergic reactions like skin rash, itching or hives, swelling of the face, lips, or tongue -bone pain -breathing problems -changes in vision -chest pain -feeling faint or lightheaded, falls -fever, chills -pain, swelling, warmth in the leg -pain, tingling, numbness in the hands or feet -signs and symptoms of low blood pressure like dizziness; feeling faint or lightheaded, falls; unusually weak or tired -stomach pain -swelling of the ankles, feet, hands -trouble passing urine or change in the amount of urine -unusually high or low blood pressure -unusually weak or tired Side effects that usually do not require medical attention (report to your doctor or health care professional if they continue or are bothersome): -change in sex drive or performance -changes in breast size in both males and females -changes in emotions or moods -headache -hot flashes -irritation at site where injected -loss of appetite -skin problems like acne, dry skin -vaginal dryness This list may not describe all possible side effects. Call your doctor for medical advice about side effects. You may report side effects to FDA at 1-800-FDA-1088. Where should I keep my medicine? This drug is given in a hospital or clinic and will not be stored at home. NOTE: This sheet is a summary. It may not cover all possible information. If you have questions about this medicine, talk to your doctor, pharmacist, or health care provider.    2016, Elsevier/Gold Standard. (2013-05-25 11:10:35)  

## 2016-01-19 DIAGNOSIS — J45909 Unspecified asthma, uncomplicated: Secondary | ICD-10-CM

## 2016-01-19 DIAGNOSIS — Z01818 Encounter for other preprocedural examination: Secondary | ICD-10-CM | POA: Diagnosis not present

## 2016-01-19 DIAGNOSIS — G932 Benign intracranial hypertension: Secondary | ICD-10-CM | POA: Diagnosis not present

## 2016-01-19 DIAGNOSIS — I890 Lymphedema, not elsewhere classified: Secondary | ICD-10-CM | POA: Insufficient documentation

## 2016-01-19 DIAGNOSIS — Z853 Personal history of malignant neoplasm of breast: Secondary | ICD-10-CM | POA: Insufficient documentation

## 2016-01-19 DIAGNOSIS — J4530 Mild persistent asthma, uncomplicated: Secondary | ICD-10-CM | POA: Insufficient documentation

## 2016-01-19 DIAGNOSIS — G629 Polyneuropathy, unspecified: Secondary | ICD-10-CM | POA: Insufficient documentation

## 2016-01-19 DIAGNOSIS — N189 Chronic kidney disease, unspecified: Secondary | ICD-10-CM | POA: Diagnosis not present

## 2016-01-19 HISTORY — DX: Unspecified asthma, uncomplicated: J45.909

## 2016-01-22 ENCOUNTER — Ambulatory Visit (INDEPENDENT_AMBULATORY_CARE_PROVIDER_SITE_OTHER): Payer: Medicare Other | Admitting: Physician Assistant

## 2016-01-22 ENCOUNTER — Encounter: Payer: Self-pay | Admitting: Physician Assistant

## 2016-01-22 VITALS — BP 196/100 | HR 103 | Ht 66.0 in | Wt 192.8 lb

## 2016-01-22 DIAGNOSIS — G932 Benign intracranial hypertension: Secondary | ICD-10-CM

## 2016-01-22 DIAGNOSIS — J45991 Cough variant asthma: Secondary | ICD-10-CM

## 2016-01-22 DIAGNOSIS — I5032 Chronic diastolic (congestive) heart failure: Secondary | ICD-10-CM

## 2016-01-22 DIAGNOSIS — I10 Essential (primary) hypertension: Secondary | ICD-10-CM | POA: Diagnosis not present

## 2016-01-22 MED ORDER — RANOLAZINE ER 500 MG PO TB12: 500.0000 mg | ORAL_TABLET | Freq: Two times a day (BID) | ORAL | 1 refills | Status: DC

## 2016-01-22 NOTE — Patient Instructions (Signed)
Medication Instructions:  Your physician has recommended you make the following change in your medication:  1. Discontinue Imdur 2. Start Ranexa ( 500 mg ) twice a day.  New prescription sent in today to requested pharmacy.   Labwork: -None  Testing/Procedures: -None  Follow-Up: Your physician recommends that you keep your scheduled  follow-up appointment with Dr. Meda Coffee.   Any Other Special Instructions Will Be Listed Below (If Applicable).     If you need a refill on your cardiac medications before your next appointment, please call your pharmacy.

## 2016-01-22 NOTE — Progress Notes (Signed)
Cardiology Office Note    Date:  01/22/2016   ID:  Erica [name], DOB [birth year], MRN [id number]  PCP:  Erica Calico, MD  Cardiologist: Dr Erica [name]  Chief Complaint  Patient presents with  . Follow-up    History of Present Illness:  Erica [name] is a 40 y.o. female with history of asthma, obesity and hypertension, diastolic CHF, HLD,OSA, and breast cancer in 2010 treated with neoadjuvant chemo with docetaxel and cyclophosphamide, and paclitaxel. She also has history of recurrent syncope. He cardio monitor showed only sinus rhythm. Was felt may be due to narcolepsy. Sleep stay showed severe sleep apnea but she is noncompliant with CPap machine.  Patient has history of pseudotumor cerebri And has idiopathic intracranial hypertension. She is having surgery at [facility] on Thursday for insertion of a ventricular peritoneal shunt.  She saw Dr. Meda [name] 01/05/16 with complaints of sharp chest pain and acute bronchitis. She was started on Z-Pak as well as Mucinex breast or cough. She is here today to make sure there is to see if there is improvement and if not consider possible pericarditis.   She comes in today for follow-up. She is still coughing but does feel better after her Z-Pak. She is seeing her asthma doctor tomorrow. Her chest pain is more around her lower right sternum and rib cage that tender to touch from all the coughing. No fevers. Her blood pressure is quite high today but she says her fluid levels are up and she is anxious to have the surgery on Thursday. She is complaining of headaches on isosorbide and would like to change this medication.    Past Medical History:  Diagnosis Date  . Anemia, unspecified 04/13/2011  . Asthma   . Asthma 09/27/2010  . Bloating 03/18/2011  . Breast cancer ([place]) 2010   a. locally advanced left breast carcinoma diagnosed in 2010 and treated with neoadjuvant chemotherapy with docetaxel and cyclophosphamide as well as paclitaxel. This was followed by  radiation therapy which was completed in 2011.  Marland Kitchen Dyspnea on exertion    a. 01/2013 Lexi MV: EF 54%, no ischemia/infarct;  b. 05/2013 Echo: EF 60-65%, no rwma, Gr 2 DD;  b.   . Fatty liver   . Gastroparesis   . GERD (gastroesophageal reflux disease)   . Hypertension   . Impaired glucose tolerance 04/13/2011  . Lymphadenitis, chronic    restricted LEFT extremity  . 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 OSA (obstructive sleep apnea) 05/04/2014   AHI 31/hr now on CPAP at 12cm H2O    Past Surgical History:  Procedure Laterality Date  . BREAST LUMPECTOMY Left 09/2008  . CARPAL TUNNEL RELEASE Left 2011  . LYMPH NODE DISSECTION Left 2010   breast; 2 wks after breast lumpectomy  . TENDON RELEASE Right 2012   "hand"  . TUBAL LIGATION  05/11/2011   Procedure: POST PARTUM TUBAL LIGATION;  Surgeon: Emeterio Reeve, MD;  Location: [facility];  Service: Gynecology;  Laterality: Bilateral;  Induced for HTN    Current Medications: Outpatient Medications Prior to Visit  Medication Sig Dispense Refill  . acetaZOLAMIDE (DIAMOX) 250 MG tablet Take 4 tablets (1,000 mg total) by mouth 3 (three) times daily. 400 tablet 11  . albuterol (PROVENTIL) (2.5 MG/3ML) 0.083% nebulizer solution USE 1 VIAL VIA NEBULIZER EVERY 4 HOURS AS NEEDED( NEED APPOINTMENT) 300 mL 2  . amitriptyline (ELAVIL) 100 MG tablet Take 1 tablet (100 mg total) by mouth at bedtime. 90 tablet 1  .  amLODipine (NORVASC) 10 MG tablet TAKE 1 TABLET(10 MG) BY MOUTH DAILY 90 tablet 0  . Azilsartan-Chlorthalidone (EDARBYCLOR) 40-25 MG TABS Take 1 tablet by mouth daily. 21 tablet 0  . beclomethasone (QVAR) 80 MCG/ACT inhaler Inhale 2 puffs into the lungs 2 (two) times daily. 1 Inhaler 5  . Biotin 10 MG TABS Take 1 tablet by mouth every morning.     . cloNIDine (CATAPRES) 0.3 MG tablet TAKE 1 TABLET BY MOUTH TWICE DAILY 180 tablet 3  . dexlansoprazole (DEXILANT) 60 MG capsule Take 1 capsule (60 mg total) by mouth daily. 30 capsule 5  .  dextromethorphan-guaiFENesin (MUCINEX DM) 30-600 MG 12hr tablet Take 1 tablet by mouth 2 (two) times daily. 30 tablet 0  . dicyclomine (BENTYL) 20 MG tablet Take 1 tablet (20 mg total) by mouth 3 (three) times daily as needed for spasms. 90 tablet 2  . diltiazem (CARDIZEM LA) 420 MG 24 hr tablet Take 1 tablet (420 mg total) by mouth daily. 90 tablet 3  . DULoxetine (CYMBALTA) 60 MG capsule Take 1 capsule (60 mg total) by mouth daily. 90 capsule 3  . EPINEPHrine 0.3 mg/0.3 mL IJ SOAJ injection Inject 0.3 mLs (0.3 mg total) into the muscle once as needed (anaphylaxis reaction). 2 Device 5  . Eszopiclone 3 MG TABS Take 1 tablet (3 mg total) by mouth at bedtime as needed. 30 tablet 3  . fluocinonide-emollient (LIDEX-E) 0.05 % cream APPLY EXTERNALLY TO THE AFFECTED AREA TWICE DAILY 60 g 0  . hydrALAZINE (APRESOLINE) 100 MG tablet TAKE 1 TABLET(100 MG) BY MOUTH THREE TIMES DAILY 90 tablet 0  . lidocaine (XYLOCAINE) 5 % ointment Apply 1 application topically 2 (two) times daily as needed. 50 g 5  . metoCLOPramide (REGLAN) 10 MG tablet TAKE 1 TABLET BY MOUTH FOUR TIMES DAILY AT BEDTIME WITH MEALS 120 tablet 5  . mometasone (NASONEX) 50 MCG/ACT nasal spray Place 2 sprays into the nose 2 (two) times daily. 17 g 5  . montelukast (SINGULAIR) 10 MG tablet TAKE 1 TABLET BY MOUTH EVERY NIGHT AT BEDTIME 90 tablet 1  . Nebivolol HCl (BYSTOLIC) 20 MG TABS 2 tabs by mouth per day 180 tablet 3  . NITROSTAT 0.4 MG SL tablet PLACE 1 TABLET UNDER TONGUE EVERY 5 MINUTES AS NEEDED FOR CHEST PAIN, MAX OF 3 TABLETS THEN CALL 911 IF PAIN PERSISTS 100 tablet 0  . OXYGEN Use CPAP at bedtime    . potassium chloride SA (K-DUR,KLOR-CON) 20 MEQ tablet Take 1 tablet (20 mEq total) by mouth 3 (three) times daily. 90 tablet 5  . primidone (MYSOLINE) 50 MG tablet Take 50 mg by mouth 2 (two) times daily.     . promethazine (PHENERGAN) 12.5 MG tablet Take 1-2 tablets by mouth every 8 hours as needed. Beware of sedation 50 tablet 2  .  Respiratory Therapy Supplies (FLUTTER) DEVI Use as directed 1 each 0  . spironolactone (ALDACTONE) 25 MG tablet Take 1 tablet (25 mg total) by mouth daily. 90 tablet 3  . SYMBICORT 160-4.5 MCG/ACT inhaler INHALE 2 PUFFS BY MOUTH TWICE DAILY 10.2 g 5  . topiramate (TOPAMAX) 25 MG tablet Take 25 mg by mouth 2 (two) times daily.    . traZODone (DESYREL) 150 MG tablet Take 50 mg by mouth at bedtime.   1  . VENTOLIN HFA 108 (90 Base) MCG/ACT inhaler INHALE 2 PUFFS INTO THE LUNGS EVERY 4 HOURS AS NEEDED FOR WHEEZING OR SHORTNESS OF BREATH 18 g 5  . isosorbide mononitrate (  IMDUR) 60 MG 24 hr tablet Take 60 mg by mouth every morning.     Facility-Administered Medications Prior to Visit  Medication Dose Route Frequency Provider Last Rate Last Dose  . goserelin (ZOLADEX) injection 3.6 mg  3.6 mg Subcutaneous Once Chauncey Cruel, MD         Allergies:   Aspirin; Bactrim [sulfamethoxazole-trimethoprim]; Doxycycline; Penicillins; Kiwi extract; and Strawberry extract   Social History   Social History  . Marital status: Single    Spouse name: N/A  . Number of children: 3  . Years of education: N/A   Occupational History  .  Unemployed   Social History Main Topics  . Smoking status: Never Smoker  . Smokeless tobacco: Never Used  . Alcohol use 0.0 oz/week     Comment: occasionally/socially  . Drug use: No  . Sexual activity: Yes    Birth control/ protection: Surgical   Other Topics Concern  . None   Social History Narrative   She lives with four children.   She is currently not working (disbaility pending).           Family History:  The patient's family history includes Breast cancer in her other and other; Diabetes in her maternal grandmother; Fibroids (age of onset: 49) in her sister; Heart disease in her maternal grandmother; Hypertension in her maternal grandmother; Other (age of onset: 85) in her mother.   ROS:   Please see the history of present illness.    Review of Systems   Constitution: Positive for malaise/fatigue.  HENT: Positive for headaches.   Eyes: Negative.   Cardiovascular: Positive for chest pain and dyspnea on exertion.  Respiratory: Positive for cough and shortness of breath.   Hematologic/Lymphatic: Negative.   Musculoskeletal: Negative.  Negative for joint pain.  Gastrointestinal: Negative.   Genitourinary: Negative.    All other systems reviewed and are negative.   PHYSICAL EXAM:   VS:  BP (!) 196/100   Pulse (!) 103   Ht 5\' 6"  (1.676 m)   Wt 192 lb 12.8 oz (87.5 kg)   SpO2 97%   BMI 31.12 kg/m   Physical Exam  GEN: Well nourished, well developed, in no acute distress  Neck: no JVD, carotid bruits, or masses Cardiac:RRR;distant heart sounds, no murmurs, rubs, or gallops  Respiratory:  Decreased breath sounds with scattered rhonchi and wheezing  GI: soft, nontender, nondistended, + BS Ext: without cyanosis, clubbing, or edema, Good distal pulses bilaterally MS: no deformity or atrophy  Skin: warm and dry, no rash Psych: euthymic mood, full affect  Wt Readings from Last 3 Encounters:  01/22/16 192 lb 12.8 oz (87.5 kg)  01/10/16 188 lb (85.3 kg)  01/09/16 187 lb 4 oz (84.9 kg)      Studies/Labs Reviewed:   EKG:  EKG is not ordered today.   Recent Labs: 06/15/2015: TSH 1.06 12/05/2015: Magnesium 1.9 12/26/2015: ALT 20; BUN 9.1; Creatinine 1.0; HGB 13.6; Platelets 239; Potassium 3.5; Sodium 143   Lipid Panel    Component Value Date/Time   CHOL 206 (H) 06/15/2015 1645   TRIG 110.0 06/15/2015 1645   HDL 44.10 06/15/2015 1645   CHOLHDL 5 06/15/2015 1645   VLDL 22.0 06/15/2015 1645   LDLCALC 140 (H) 06/15/2015 1645    Additional studies/ records that were reviewed today include:   Nuclear stress test: 02/15/2013 Impression  Exercise Capacity: Lexiscan with no exercise.  BP Response: Hypertensive blood pressure response.  Clinical Symptoms: Chest pain, headache.  ECG Impression: No  significant ST segment change  suggestive of ischemia.  Comparison with Prior Nuclear Study: No images to compare  Overall Impression: Normal stress nuclear study.  LV Ejection Fraction: 54%. LV Wall Motion: NL LV Function; NL Wall Motion  Loralie Champagne  02/12/2013     TTE 06/17/2013   Left ventricle: The cavity size was normal. There was mild concentric hypertrophy. Systolic function was normal. The estimated ejection fraction was in the range of 60% to 65%. Wall motion was normal; there were no regional wall motion abnormalities. Features are consistent with a pseudonormal left ventricular filling pattern, with concomitant abnormal relaxation and increased filling pressure (grade 2 diastolic dysfunction). Doppler parameters are consistent with high ventricular filling pressure. Impressions: - When compared to 2010, EF has improved.  Accessory Clinical Findings      ASSESSMENT:    1. Pseudotumor cerebri   2. Essential hypertension   3. Chronic diastolic CHF (congestive heart failure) ([place])   4. Cough variant asthma      PLAN:  In order of problems listed above:  Pseudotumor cerebri with idiopathic intracranial hypertension with elevated blood pressures today. For surgery at [facility] on Thursday for insertion of ventriculo peritoneal shunt. Follow-up with Dr. Meda [name] in 3 months.  Essential hypertension see above patient has a severe headache taking them door and says it's different from her regular headaches. She would like to change it. We will stop Imdur and add Ranexa 500 mg twice a day.  Chronic diastolic CHF compensated  Asthma continues to wheeze and cough although somewhat improved. Following up with asthma doctor tomorrow.  Medication Adjustments/Labs and Tests Ordered: Current medicines are reviewed at length with the patient today.  Concerns regarding medicines are outlined above.  Medication changes, Labs and Tests ordered today are listed in the Patient Instructions below. Patient  Instructions  Medication Instructions:  Your physician has recommended you make the following change in your medication:  1. Discontinue Imdur 2. Start Ranexa ( 500 mg ) twice a day.  New prescription sent in today to requested pharmacy.   Labwork: -None  Testing/Procedures: -None  Follow-Up: Your physician recommends that you keep your scheduled  follow-up appointment with Dr. Meda [name].   Any Other Special Instructions Will Be Listed Below (If Applicable).     If you need a refill on your cardiac medications before your next appointment, please call your pharmacy.      Signed, Ermalinda Barrios, PA-C  01/22/2016 [facility] [address], [place], [place]  [address] Phone: [phone]; Fax: [phone]

## 2016-01-25 DIAGNOSIS — G4730 Sleep apnea, unspecified: Secondary | ICD-10-CM | POA: Diagnosis present

## 2016-01-25 DIAGNOSIS — J45909 Unspecified asthma, uncomplicated: Secondary | ICD-10-CM | POA: Diagnosis present

## 2016-01-25 DIAGNOSIS — Z982 Presence of cerebrospinal fluid drainage device: Secondary | ICD-10-CM | POA: Diagnosis not present

## 2016-01-25 DIAGNOSIS — R918 Other nonspecific abnormal finding of lung field: Secondary | ICD-10-CM | POA: Diagnosis not present

## 2016-01-25 DIAGNOSIS — K219 Gastro-esophageal reflux disease without esophagitis: Secondary | ICD-10-CM | POA: Diagnosis present

## 2016-01-25 DIAGNOSIS — Z4541 Encounter for adjustment and management of cerebrospinal fluid drainage device: Secondary | ICD-10-CM | POA: Diagnosis not present

## 2016-01-25 DIAGNOSIS — N189 Chronic kidney disease, unspecified: Secondary | ICD-10-CM | POA: Diagnosis present

## 2016-01-25 DIAGNOSIS — Z9221 Personal history of antineoplastic chemotherapy: Secondary | ICD-10-CM | POA: Diagnosis not present

## 2016-01-25 DIAGNOSIS — Z9851 Tubal ligation status: Secondary | ICD-10-CM | POA: Diagnosis not present

## 2016-01-25 DIAGNOSIS — Z923 Personal history of irradiation: Secondary | ICD-10-CM | POA: Diagnosis not present

## 2016-01-25 DIAGNOSIS — R5381 Other malaise: Secondary | ICD-10-CM | POA: Diagnosis not present

## 2016-01-25 DIAGNOSIS — I517 Cardiomegaly: Secondary | ICD-10-CM | POA: Diagnosis not present

## 2016-01-25 DIAGNOSIS — Z853 Personal history of malignant neoplasm of breast: Secondary | ICD-10-CM | POA: Diagnosis not present

## 2016-01-25 DIAGNOSIS — R404 Transient alteration of awareness: Secondary | ICD-10-CM | POA: Diagnosis not present

## 2016-01-25 DIAGNOSIS — H468 Other optic neuritis: Secondary | ICD-10-CM | POA: Diagnosis present

## 2016-01-25 DIAGNOSIS — I130 Hypertensive heart and chronic kidney disease with heart failure and stage 1 through stage 4 chronic kidney disease, or unspecified chronic kidney disease: Secondary | ICD-10-CM | POA: Diagnosis present

## 2016-01-25 DIAGNOSIS — I890 Lymphedema, not elsewhere classified: Secondary | ICD-10-CM | POA: Diagnosis present

## 2016-01-25 DIAGNOSIS — G932 Benign intracranial hypertension: Secondary | ICD-10-CM | POA: Diagnosis not present

## 2016-01-25 DIAGNOSIS — I69398 Other sequelae of cerebral infarction: Secondary | ICD-10-CM | POA: Diagnosis not present

## 2016-01-25 DIAGNOSIS — I5032 Chronic diastolic (congestive) heart failure: Secondary | ICD-10-CM | POA: Diagnosis present

## 2016-01-25 DIAGNOSIS — R400 Somnolence: Secondary | ICD-10-CM | POA: Diagnosis not present

## 2016-01-25 DIAGNOSIS — G629 Polyneuropathy, unspecified: Secondary | ICD-10-CM | POA: Diagnosis present

## 2016-01-25 DIAGNOSIS — K3184 Gastroparesis: Secondary | ICD-10-CM | POA: Diagnosis present

## 2016-01-26 ENCOUNTER — Encounter: Payer: Medicare Other | Admitting: Adult Health

## 2016-01-29 ENCOUNTER — Encounter: Payer: Self-pay | Admitting: Adult Health

## 2016-01-29 DIAGNOSIS — H4710 Unspecified papilledema: Secondary | ICD-10-CM | POA: Diagnosis not present

## 2016-01-29 DIAGNOSIS — I129 Hypertensive chronic kidney disease with stage 1 through stage 4 chronic kidney disease, or unspecified chronic kidney disease: Secondary | ICD-10-CM | POA: Diagnosis not present

## 2016-01-29 DIAGNOSIS — N189 Chronic kidney disease, unspecified: Secondary | ICD-10-CM | POA: Diagnosis not present

## 2016-01-29 DIAGNOSIS — M549 Dorsalgia, unspecified: Secondary | ICD-10-CM | POA: Diagnosis not present

## 2016-01-29 DIAGNOSIS — M542 Cervicalgia: Secondary | ICD-10-CM | POA: Diagnosis not present

## 2016-01-29 DIAGNOSIS — G629 Polyneuropathy, unspecified: Secondary | ICD-10-CM | POA: Diagnosis not present

## 2016-01-29 DIAGNOSIS — G894 Chronic pain syndrome: Secondary | ICD-10-CM | POA: Diagnosis not present

## 2016-01-29 DIAGNOSIS — G932 Benign intracranial hypertension: Secondary | ICD-10-CM | POA: Diagnosis not present

## 2016-01-29 DIAGNOSIS — J45909 Unspecified asthma, uncomplicated: Secondary | ICD-10-CM | POA: Diagnosis not present

## 2016-01-29 DIAGNOSIS — Z9981 Dependence on supplemental oxygen: Secondary | ICD-10-CM | POA: Diagnosis not present

## 2016-01-29 NOTE — Telephone Encounter (Signed)
Pt noshowed for 10.27.17 med calendar with TP E-mail sent to patient asking how far out we need to [facility] or she can call and the schedulers can take care of it over the phone

## 2016-01-30 ENCOUNTER — Telehealth: Payer: Self-pay | Admitting: Emergency Medicine

## 2016-01-30 NOTE — Telephone Encounter (Signed)
[facility] called and pt was at [facility] 01/25/16-01/26/16. She had a shunt put in. He is called to request verbal orders for PT 2 week 3. Balance, strength, endurance and walking. Please advise thanks.

## 2016-01-30 NOTE — Telephone Encounter (Signed)
Verbal given 

## 2016-01-31 ENCOUNTER — Encounter (HOSPITAL_COMMUNITY): Payer: Self-pay | Admitting: Emergency Medicine

## 2016-01-31 ENCOUNTER — Emergency Department (HOSPITAL_COMMUNITY)
Admission: EM | Admit: 2016-01-31 | Discharge: 2016-01-31 | Disposition: A | Discharge status: Home / Self Care | Payer: Medicare Other | Attending: Emergency Medicine | Admitting: Emergency Medicine

## 2016-01-31 ENCOUNTER — Emergency Department (HOSPITAL_COMMUNITY): Payer: Medicare Other

## 2016-01-31 DIAGNOSIS — R112 Nausea with vomiting, unspecified: Secondary | ICD-10-CM | POA: Insufficient documentation

## 2016-01-31 DIAGNOSIS — R51 Headache: Secondary | ICD-10-CM | POA: Diagnosis not present

## 2016-01-31 DIAGNOSIS — Z982 Presence of cerebrospinal fluid drainage device: Secondary | ICD-10-CM | POA: Diagnosis not present

## 2016-01-31 DIAGNOSIS — I110 Hypertensive heart disease with heart failure: Secondary | ICD-10-CM | POA: Insufficient documentation

## 2016-01-31 DIAGNOSIS — Z853 Personal history of malignant neoplasm of breast: Secondary | ICD-10-CM | POA: Diagnosis not present

## 2016-01-31 DIAGNOSIS — I5032 Chronic diastolic (congestive) heart failure: Secondary | ICD-10-CM | POA: Diagnosis not present

## 2016-01-31 DIAGNOSIS — J45909 Unspecified asthma, uncomplicated: Secondary | ICD-10-CM | POA: Diagnosis not present

## 2016-01-31 DIAGNOSIS — Z79899 Other long term (current) drug therapy: Secondary | ICD-10-CM | POA: Insufficient documentation

## 2016-01-31 DIAGNOSIS — R519 Headache, unspecified: Secondary | ICD-10-CM

## 2016-01-31 LAB — COMPREHENSIVE METABOLIC PANEL
ALBUMIN: 4.3 g/dL (ref 3.5–5.0)
ALT: 28 U/L (ref 14–54)
ANION GAP: 8 (ref 5–15)
AST: 15 U/L (ref 15–41)
Alkaline Phosphatase: 64 U/L (ref 38–126)
BILIRUBIN TOTAL: 1 mg/dL (ref 0.3–1.2)
BUN: 24 mg/dL — ABNORMAL HIGH (ref 6–20)
CO2: 29 mmol/L (ref 22–32)
Calcium: 9.6 mg/dL (ref 8.9–10.3)
Chloride: 105 mmol/L (ref 101–111)
Creatinine, Ser: 1.08 mg/dL — ABNORMAL HIGH (ref 0.44–1.00)
GFR calc Af Amer: >60 mL/min (ref >60)
GFR calc non Af Amer: >60 mL/min (ref >60)
GLUCOSE: 99 mg/dL (ref 65–99)
POTASSIUM: 3.2 mmol/L — AB (ref 3.5–5.1)
Sodium: 142 mmol/L (ref 135–145)
TOTAL PROTEIN: 8 g/dL (ref 6.5–8.1)

## 2016-01-31 LAB — URINE MICROSCOPIC-ADD ON
BACTERIA UA: NONE SEEN
RBC / HPF: NONE SEEN RBC/hpf (ref 0–5)
WBC, UA: NONE SEEN WBC/hpf (ref 0–5)

## 2016-01-31 LAB — URINALYSIS, ROUTINE W REFLEX MICROSCOPIC
GLUCOSE, UA: NEGATIVE mg/dL
Hgb urine dipstick: NEGATIVE
KETONES UR: 15 mg/dL — AB
LEUKOCYTES UA: NEGATIVE
NITRITE: NEGATIVE
PROTEIN: 30 mg/dL — AB
Specific Gravity, Urine: 1.025 (ref 1.005–1.030)
pH: 6 (ref 5.0–8.0)

## 2016-01-31 LAB — CBC WITH DIFFERENTIAL/PLATELET
BASOS ABS: 0 10*3/uL (ref 0.0–0.1)
Basophils Relative: 0 %
Eosinophils Absolute: 0 10*3/uL (ref 0.0–0.7)
Eosinophils Relative: 1 %
HEMATOCRIT: 41.8 % (ref 36.0–46.0)
HEMOGLOBIN: 14.2 g/dL (ref 12.0–15.0)
LYMPHS PCT: 16 %
Lymphs Abs: 1.2 10*3/uL (ref 0.7–4.0)
MCH: 30.3 pg (ref 26.0–34.0)
MCHC: 34 g/dL (ref 30.0–36.0)
MCV: 89.1 fL (ref 78.0–100.0)
Monocytes Absolute: 0.3 10*3/uL (ref 0.1–1.0)
Monocytes Relative: 4 %
NEUTROS ABS: 5.9 10*3/uL (ref 1.7–7.7)
NEUTROS PCT: 79 %
Platelets: 249 10*3/uL (ref 150–400)
RBC: 4.69 MIL/uL (ref 3.87–5.11)
RDW: 13.6 % (ref 11.5–15.5)
WBC: 7.4 10*3/uL (ref 4.0–10.5)

## 2016-01-31 LAB — POC URINE PREG, ED: Preg Test, Ur: NEGATIVE

## 2016-01-31 LAB — LIPASE, BLOOD: LIPASE: 16 U/L (ref 11–51)

## 2016-01-31 MED ORDER — ONDANSETRON HCL 4 MG PO TABS: 4.0000 mg | ORAL_TABLET | Freq: Four times a day (QID) | ORAL | 0 refills | Status: DC

## 2016-01-31 MED ORDER — ONDANSETRON HCL 4 MG/2ML IJ SOLN
4.0000 mg | Freq: Once | INTRAMUSCULAR | Status: AC
  Administered 2016-01-31: 4 mg via INTRAVENOUS
  Filled 2016-01-31: qty 2

## 2016-01-31 NOTE — ED Provider Notes (Signed)
[facility] DEPT Provider Note   CSN: [id number] Arrival date & time: 01/31/16  1701     History   Chief Complaint Chief Complaint  Patient presents with  . Emesis    Post-surgery    HPI Erica [name] is a 40 y.o. female.  She complains of vomiting which started yesterday and has continued today. She is only nauseous for a few seconds to a few minutes before she vomits. However, she has anorexia during the rest of the time. She denies any abdominal pain. She complains of a frontal headache which he rates at 8/10. She had placement of the ventriculoperitoneal shunt on October 26 and has had the headache since surgery. This is different from the headache that she had prior to the surgery. Surgery was for pseudotumor cerebri. There's been no fever or chills or sweats. She denies any vision changes. She has been taking promethazine 12.5 mg which has not been effective at controlling her emesis.   The history is provided by the patient.    Past Medical History:  Diagnosis Date  . Anemia, unspecified 04/13/2011  . Asthma   . Asthma 09/27/2010  . Bloating 03/18/2011  . Breast cancer ([place]) 2010   a. locally advanced left breast carcinoma diagnosed in 2010 and treated with neoadjuvant chemotherapy with docetaxel and cyclophosphamide as well as paclitaxel. This was followed by radiation therapy which was completed in 2011.  Marland Kitchen Dyspnea on exertion    a. 01/2013 Lexi MV: EF 54%, no ischemia/infarct;  b. 05/2013 Echo: EF 60-65%, no rwma, Gr 2 DD;  b.   . Fatty liver   . Gastroparesis   . GERD (gastroesophageal reflux disease)   . Hypertension   . Impaired glucose tolerance 04/13/2011  . Lymphadenitis, chronic    restricted LEFT extremity  . 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 OSA (obstructive sleep apnea) 05/04/2014   AHI 31/hr now on CPAP at 12cm H2O    Patient Active Problem List   Diagnosis Date Noted  . Genetic testing 12/27/2015  . Hypertensive heart disease 09/26/2015    . 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 Hypertriglyceridemia without hypercholesterolemia 06/15/2015  . Hypokalemia 06/15/2015  . Insomnia secondary to anxiety 04/05/2015  . Eczema of right hand 01/23/2015  . Pseudotumor cerebri 10/21/2014  . Upper airway cough syndrome 10/09/2014  . Essential hypertension 10/09/2014  . Morbid obesity ([place]) 10/09/2014  . OSA (obstructive sleep apnea) 05/04/2014  . Daytime somnolence 02/10/2014  . Chronic diastolic CHF (congestive heart failure) ([place]) 06/23/2013  . Breast cancer of upper-outer quadrant of left female breast ([place]) 01/07/2013  . Gastroparesis 01/04/2013  . Axillary hidradenitis suppurativa 08/01/2011  . Preventative health care 04/13/2011  . Hyperglycemia 04/13/2011  . GERD (gastroesophageal reflux disease) 03/18/2011  . Cough variant asthma 09/27/2010  . 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 BRAIN SURGERY     Shunt  . BREAST LUMPECTOMY Left 09/2008  . CARPAL TUNNEL RELEASE Left 2011  . LYMPH NODE DISSECTION Left 2010   breast; 2 wks after breast lumpectomy  . TENDON RELEASE Right 2012   "hand"  . TUBAL LIGATION  05/11/2011   Procedure: POST PARTUM TUBAL LIGATION;  Surgeon: Emeterio Reeve, MD;  Location: [facility];  Service: Gynecology;  Laterality: Bilateral;  Induced for HTN    OB History    Gravida Para Term Preterm AB Living   5 4 4   1 4    SAB TAB  Ectopic Multiple Live Births   1       4       Home Medications    Prior to Admission medications   Medication Sig Start Date End Date Taking? Authorizing Provider  acetaZOLAMIDE (DIAMOX) 250 MG tablet Take 4 tablets (1,000 mg total) by mouth 3 (three) times daily. 12/08/15   Donika K Patel, DO  albuterol (PROVENTIL) (2.5 MG/3ML) 0.083% nebulizer solution USE 1 VIAL VIA NEBULIZER EVERY 4 HOURS AS NEEDED( NEED APPOINTMENT) 08/30/15   Tanda Rockers, MD  amitriptyline (ELAVIL) 100 MG tablet Take 1 tablet (100 mg total) by  mouth at bedtime. 11/03/15   Biagio Borg, MD  amLODipine (NORVASC) 10 MG tablet TAKE 1 TABLET(10 MG) BY MOUTH DAILY 10/26/15   Biagio Borg, MD  Azilsartan-Chlorthalidone (EDARBYCLOR) 40-25 MG TABS Take 1 tablet by mouth daily. 12/05/15   [name], MD  beclomethasone (QVAR) 80 MCG/ACT inhaler Inhale 2 puffs into the lungs 2 (two) times daily. 07/24/15   Tanda Rockers, MD  Biotin 10 MG TABS Take 1 tablet by mouth every morning.     Historical Provider, MD  cloNIDine (CATAPRES) 0.3 MG tablet TAKE 1 TABLET BY MOUTH TWICE DAILY 09/08/15   [name], MD  dexlansoprazole (DEXILANT) 60 MG capsule Take 1 capsule (60 mg total) by mouth daily. 01/10/16   Jerene Bears, MD  dextromethorphan-guaiFENesin (MUCINEX DM) 30-600 MG 12hr tablet Take 1 tablet by mouth 2 (two) times daily. 01/05/16   [name], MD  dicyclomine (BENTYL) 20 MG tablet Take 1 tablet (20 mg total) by mouth 3 (three) times daily as needed for spasms. 01/10/16   Jerene Bears, MD  diltiazem (CARDIZEM LA) 420 MG 24 hr tablet Take 1 tablet (420 mg total) by mouth daily. 04/07/15   [name], MD  DULoxetine (CYMBALTA) 60 MG capsule Take 1 capsule (60 mg total) by mouth daily. 10/26/15   Donika K Patel, DO  EPINEPHrine 0.3 mg/0.3 mL IJ SOAJ injection Inject 0.3 mLs (0.3 mg total) into the muscle once as needed (anaphylaxis reaction). 09/11/15   [name], MD  Eszopiclone 3 MG TABS Take 1 tablet (3 mg total) by mouth at bedtime as needed. 12/08/15   [name], MD  fluocinonide-emollient (LIDEX-E) 0.05 % cream APPLY EXTERNALLY TO THE AFFECTED AREA TWICE DAILY 12/08/15   [name], MD  hydrALAZINE (APRESOLINE) 100 MG tablet TAKE 1 TABLET(100 MG) BY MOUTH THREE TIMES DAILY 12/12/15   Biagio Borg, MD  lidocaine (XYLOCAINE) 5 % ointment Apply 1 application topically 2 (two) times daily as needed. 10/26/15   Donika K Patel, DO  metoCLOPramide (REGLAN) 10 MG tablet TAKE 1 TABLET BY MOUTH FOUR TIMES DAILY AT BEDTIME WITH MEALS  01/10/16   Jerene Bears, MD  mometasone (NASONEX) 50 MCG/ACT nasal spray Place 2 sprays into the nose 2 (two) times daily. 07/18/15   Tammy S Parrett, NP  montelukast (SINGULAIR) 10 MG tablet TAKE 1 TABLET BY MOUTH EVERY NIGHT AT BEDTIME 04/07/15   Tanda Rockers, MD  Nebivolol HCl (BYSTOLIC) 20 MG TABS 2 tabs by mouth per day 11/03/15   Biagio Borg, MD  NITROSTAT 0.4 MG SL tablet PLACE 1 TABLET UNDER TONGUE EVERY 5 MINUTES AS NEEDED FOR CHEST PAIN, MAX OF 3 TABLETS THEN CALL 911 IF PAIN PERSISTS 10/26/15   [name], MD  OXYGEN Use CPAP at bedtime    Historical Provider, MD  potassium  chloride SA (K-DUR,KLOR-CON) 20 MEQ tablet Take 1 tablet (20 mEq total) by mouth 3 (three) times daily. 12/06/15   [name], MD  primidone (MYSOLINE) 50 MG tablet Take 50 mg by mouth 2 (two) times daily.  01/23/15   Historical Provider, MD  promethazine (PHENERGAN) 12.5 MG tablet Take 1-2 tablets by mouth every 8 hours as needed. Beware of sedation 01/10/16   Jerene Bears, MD  ranolazine (RANEXA) 500 MG 12 hr tablet Take 1 tablet (500 mg total) by mouth 2 (two) times daily. 01/22/16   Imogene Burn, PA-C  Respiratory Therapy Supplies (FLUTTER) DEVI Use as directed 03/24/15   Tanda Rockers, MD  spironolactone (ALDACTONE) 25 MG tablet Take 1 tablet (25 mg total) by mouth daily. 09/26/15   [name], MD  SYMBICORT 160-4.5 MCG/ACT inhaler INHALE 2 PUFFS BY MOUTH TWICE DAILY 01/23/15   Tammy S Parrett, NP  topiramate (TOPAMAX) 25 MG tablet Take 25 mg by mouth 2 (two) times daily.    Historical Provider, MD  traZODone (DESYREL) 150 MG tablet Take 50 mg by mouth at bedtime.  09/08/15   Historical Provider, MD  VENTOLIN HFA 108 (90 Base) MCG/ACT inhaler INHALE 2 PUFFS INTO THE LUNGS EVERY 4 HOURS AS NEEDED FOR WHEEZING OR SHORTNESS OF BREATH 10/27/15   Tanda Rockers, MD    Family History Family History  Problem Relation Age of Onset  . Other Mother 26    breast calcifications treated with surgery, breast  cancer pill, and radiation  . Fibroids Sister 59    s/p TAH-BSO  . Diabetes Maternal Grandmother   . Heart disease Maternal Grandmother   . Hypertension Maternal Grandmother   . Breast cancer Other     maternal great aunt (MGM's sister)  . Breast cancer Other     paternal great aunt dx middle ages; s/p mastectomy  . Cancer Neg Hx   . Alcohol abuse Neg Hx   . Early death Neg Hx   . Hyperlipidemia Neg Hx   . Kidney disease Neg Hx   . Stroke Neg Hx   . Colon cancer Neg Hx     Social History Social History  Substance Use Topics  . Smoking status: Never Smoker  . Smokeless tobacco: Never Used  . Alcohol use 0.0 oz/week     Comment: occasionally/socially     Allergies   Aspirin; Bactrim [sulfamethoxazole-trimethoprim]; Doxycycline; Penicillins; Kiwi extract; and Strawberry extract   Review of Systems Review of Systems  All other systems reviewed and are negative.    Physical Exam Updated Vital Signs BP 181/99 (BP Location: Right Arm)   Pulse 83   Temp 98.8 F (37.1 C) (Oral)   Resp 18   Wt 180 lb 8 oz (81.9 kg)   SpO2 100%   BMI 29.13 kg/m   Physical Exam  Nursing note and vitals reviewed.  40 year old female, resting comfortably and in no acute distress. Vital signs are Significant for hypertension. Oxygen saturation is 100%, which is normal. Head is normocephalic and atraumatic. PERRLA, EOMI. Oropharynx is clear. Eyes show no hemorrhage, exudate, or papilledema. Neck is nontender and supple without adenopathy or JVD. Back is nontender and there is no CVA tenderness. Lungs are clear without rales, wheezes, or rhonchi. Chest is nontender. Heart has regular rate and rhythm without murmur. Abdomen is soft, flat, nontender without masses or hepatosplenomegaly and peristalsis is slightly hypoactive. Extremities have no cyanosis or edema, full range of motion is present. Skin  is warm and dry without rash. Neurologic: Mental status is normal, cranial nerves are  intact, there are no motor or sensory deficits.  ED Treatments / Results  Labs (all labs ordered are listed, but only abnormal results are displayed) Labs Reviewed  URINALYSIS, ROUTINE W REFLEX MICROSCOPIC (NOT AT [facility]) - Abnormal; Notable for the following:       Result Value   Color, Urine AMBER (*)    APPearance TURBID (*)    Bilirubin Urine SMALL (*)    Ketones, ur 15 (*)    Protein, ur 30 (*)    All other components within normal limits  COMPREHENSIVE METABOLIC PANEL - Abnormal; Notable for the following:    Potassium 3.2 (*)    BUN 24 (*)    Creatinine, Ser 1.08 (*)    All other components within normal limits  URINE MICROSCOPIC-ADD ON - Abnormal; Notable for the following:    Squamous Epithelial / LPF 0-5 (*)    All other components within normal limits  LIPASE, BLOOD  CBC WITH DIFFERENTIAL/PLATELET  POC URINE PREG, ED    Radiology Ct Head Wo Contrast  Result Date: 01/31/2016 CLINICAL DATA:  Recent ventriculoperitoneal shunt placement. Nausea and vomiting, acute onset. Initial encounter. EXAM: CT HEAD WITHOUT CONTRAST TECHNIQUE: Contiguous axial images were obtained from the base of the skull through the vertex without intravenous contrast. COMPARISON:  CT of the head performed 09/20/2014, and MRI brain from 05/18/2015 FINDINGS: Brain: No evidence of acute infarction, hemorrhage, hydrocephalus, extra-axial collection or mass lesion/mass effect. The patient's right frontal ventriculoperitoneal shunt is noted ending at the right lateral ventricle. The posterior fossa, including the cerebellum, brainstem and fourth ventricle, is within normal limits. The third and lateral ventricles, and basal ganglia are unremarkable in appearance. The cerebral hemispheres are symmetric in appearance, with normal gray-white differentiation. No mass effect or midline shift is seen. Vascular: No hyperdense vessel or unexpected calcification. Skull: There is no evidence of fracture; visualized osseous  structures are unremarkable in appearance. Sinuses/Orbits: The visualized portions of the orbits are within normal limits. The paranasal sinuses and mastoid air cells are well-aerated. Other: No significant soft tissue abnormalities are seen. IMPRESSION: 1. No acute intracranial pathology seen on CT. 2. Right frontal ventriculoperitoneal shunt is grossly unremarkable in appearance. Electronically Signed   By: Garald Balding M.D.   On: 01/31/2016 18:18    Procedures Procedures (including critical care time)  Medications Ordered in ED Medications  ondansetron (ZOFRAN) injection 4 mg (not administered)     Initial Impression / Assessment and Plan / ED Course  I have reviewed the triage vital signs and the nursing notes.  Pertinent labs & imaging results that were available during my care of the patient were reviewed by me and considered in my medical decision making (see chart for details).  Clinical Course   Nausea and vomiting in the setting of recent ventriculoperitoneal shunt placement. She will be sent for CT to evaluate the function of the shunt, and screening labs obtained. However, the pattern of pain and nausea is not suggestive of shunt failure. Will try ondansetron to see if she gets better relief of nausea. Old records are reviewed confirming placement of ventriculoperitoneal shunt for pseudotumor cerebri with surgery on October 26.  Laboratory workup is significant for mild hypokalemia which is presumably secondary to vomiting, and mild ketonuria which is also presumably secondary to vomiting. CT shows no evidence of progressive hydrocephalus, no evidence of VP shunt dysfunction. She feels much better after  IV ondansetron and has tolerated oral fluids. I've discussed with her that her dose of promethazine may be too low for her. However, since she did get relief with ondansetron, she is sent home with prescription for same. Advised that should she decide to try taking promethazine, she  should take 25 mg per dose. Also, of note, her headache has improved in the ED without receiving any analgesics. Headache is still present. She is discharged with instructions to follow-up with her PCP and her neurosurgeon. Return precautions given.  Final Clinical Impressions(s) / ED Diagnoses   Final diagnoses:  Non-intractable vomiting with nausea, unspecified vomiting type  Headache, unspecified headache type    New Prescriptions New Prescriptions   ONDANSETRON (ZOFRAN) 4 MG TABLET    Take 1 tablet (4 mg total) by mouth every 6 (six) hours.     Delora Fuel, MD [phone]

## 2016-01-31 NOTE — Discharge Instructions (Signed)
He may use either promethazine or ondansetron for nausea. If you use promethazine, take 2 tablets (25 mg) at a time.

## 2016-01-31 NOTE — Telephone Encounter (Signed)
Called spoke with patient to schedule med calendar appt > 11.9.17 @ 3:15pm Nothing further needed; will sign off

## 2016-01-31 NOTE — ED Notes (Signed)
Pt being transported to CT

## 2016-01-31 NOTE — ED Notes (Signed)
Attempted an IV x 2 with no success. Notified Matt, RN about an ultrasound IV.

## 2016-01-31 NOTE — ED Triage Notes (Addendum)
Patient had brain surgery (shunt placement) on Thursday at [facility]. Patient has had nausea/vomiting x2 days. Denies diarrhea. Patient ambulatory, alert/oriented x 4. Patient has been taking phenergan at home with minimal relief.

## 2016-01-31 NOTE — ED Notes (Signed)
Offered zofran. Patient declined.

## 2016-01-31 NOTE — ED Notes (Addendum)
Pt said she wanted a ginger ale before she urinated.

## 2016-02-01 ENCOUNTER — Ambulatory Visit: Payer: Self-pay | Admitting: Internal Medicine

## 2016-02-01 DIAGNOSIS — G932 Benign intracranial hypertension: Secondary | ICD-10-CM | POA: Diagnosis not present

## 2016-02-01 DIAGNOSIS — H4710 Unspecified papilledema: Secondary | ICD-10-CM | POA: Diagnosis not present

## 2016-02-01 DIAGNOSIS — I129 Hypertensive chronic kidney disease with stage 1 through stage 4 chronic kidney disease, or unspecified chronic kidney disease: Secondary | ICD-10-CM | POA: Diagnosis not present

## 2016-02-01 DIAGNOSIS — G629 Polyneuropathy, unspecified: Secondary | ICD-10-CM | POA: Diagnosis not present

## 2016-02-01 DIAGNOSIS — J45909 Unspecified asthma, uncomplicated: Secondary | ICD-10-CM | POA: Diagnosis not present

## 2016-02-01 DIAGNOSIS — N189 Chronic kidney disease, unspecified: Secondary | ICD-10-CM | POA: Diagnosis not present

## 2016-02-05 DIAGNOSIS — G932 Benign intracranial hypertension: Secondary | ICD-10-CM | POA: Diagnosis not present

## 2016-02-08 ENCOUNTER — Other Ambulatory Visit: Payer: Self-pay | Admitting: Adult Health

## 2016-02-08 ENCOUNTER — Ambulatory Visit (INDEPENDENT_AMBULATORY_CARE_PROVIDER_SITE_OTHER): Payer: Medicare Other | Admitting: Adult Health

## 2016-02-08 ENCOUNTER — Other Ambulatory Visit: Payer: Self-pay | Admitting: Cardiology

## 2016-02-08 ENCOUNTER — Other Ambulatory Visit: Payer: Self-pay | Admitting: Internal Medicine

## 2016-02-08 ENCOUNTER — Encounter: Payer: Self-pay | Admitting: Adult Health

## 2016-02-08 DIAGNOSIS — I10 Essential (primary) hypertension: Secondary | ICD-10-CM

## 2016-02-08 DIAGNOSIS — R05 Cough: Secondary | ICD-10-CM | POA: Diagnosis not present

## 2016-02-08 DIAGNOSIS — I5032 Chronic diastolic (congestive) heart failure: Secondary | ICD-10-CM

## 2016-02-08 DIAGNOSIS — J209 Acute bronchitis, unspecified: Secondary | ICD-10-CM

## 2016-02-08 DIAGNOSIS — R058 Other specified cough: Secondary | ICD-10-CM

## 2016-02-08 DIAGNOSIS — L309 Dermatitis, unspecified: Secondary | ICD-10-CM

## 2016-02-08 MED ORDER — TRAMADOL HCL 50 MG PO TABS: 50.0000 mg | ORAL_TABLET | Freq: Four times a day (QID) | ORAL | 0 refills | Status: DC | PRN

## 2016-02-08 NOTE — Patient Instructions (Addendum)
May use Delsym 2 tsp Twice daily  As needed cough .  Add Tramadol 50mg  1 every 6hr as needed for cough - try to limit use as able .  Sips of water to soothe cough/throat.  follow up Dr. Melvyn Novas  In 6 weeks and As needed

## 2016-02-08 NOTE — Progress Notes (Signed)
Subjective:    Patient ID: Erica [name], female     DOB: 05-10-1975    MRN: [id number]    Brief patient profile:  32 yobf with asthma all her life much worse since around 07/2010 referred by Dr [name] to pulmonary clinic in Sept 2012 Hx of Breast Cancer 05/2008    History of Present Illness  12/18/2010 Initial pulmonary office eval on acei and  advair cc dtc asthma x 4 months but baseline = excess saba use "even when better" at least twice daily despite daily advair, worse at hs in fact  used saba 3 x last night.  On chemo /RT for breast ca but last rx was Jan 2012 well before asthma worse.  To er multiple times >  no better with singular added x 3 months. rec Stop Lisinopril Dulera 200 Take 2 puffs first thing in am and then another 2 puffs about 12 hours later.  Work on inhaler technique  Nexium 40 mg   Take 30-60 min before first meal of the day and Pepcid 20 mg one bedtime until return Only use your albuterol as a rescue medication to be used if you can't catch your breath by resting or doing a relaxed purse lip breathing pattern. The less you use it, the better it will work when you need it.  Prednisone 10 mg take  4 each am x 2 days,   2 each am x 2 days,  1 each am x2days and stop    01/03/2011 f/u ov/Wert cc much better off ace and advair but still needing saba twice daily on avg. No  Purulent sputum. rec no change in rx/ rule of 2's reviewed     04/06/2013 NP Follow up and Med Review   We reviewed all her meds and organized them into a med calendar. She does have Medicaid but depends on samples. Did not bring all her meds with her today , says they are at home.  Says her breathing is the same since last ov w/ SOB, Has on /off wheezing, some chest heaviness, prod cough with yellow mucus. Good and bad days. Wears out easily .  Denies f/c/s, nausea, vomiting.  Did not stop singulair as recommended.  rec  Follow med calendar closely and bring to each visit > did not do  We are  referring you for a CPSTwith spirometry before and after> never done   04/18/2014 NP Follow up and Med Review / NP  Patient returns for a two-week follow-up and medication review. We reviewed all her medications organize them into a medication count with patient education It appears that she is taking her medications correctly, however, she has over 30 medications and has had several changes recently. Last visit, her [name] was decreased to the 111mcg says that her breathing was better on the 238mcg Patient had a chest x-ray last visit that showed no acute changes She underwent a CT sinus due to persistent cough , which was positive for sinusitis on the right She was treated with a ten-day course of Avelox, she has one dose left Does feel some better but still has cough . She denies any hemoptysis, orthopnea, PND or leg swelling  Patient has upcoming sleep study. Next week for suspected underlying sleep apnea with snoring and daytime sleepiness rec Refer to ENT for sinusitis and chronic cough > never saw one> f/u sinus CT neg 07/26/14  Increase Dulera 200 2 puffs Twice daily   Follow med calendar closely  and bring to each visit.     08/01/2014 f/u ov/Wert re:  Asthma plus noct "gasping"  Chief Complaint  Patient presents with  . Follow-up    doing well.  no complaints.  not doing well x one year with noct / early  4am with cough/ gasping for air almost always goes to neb and then can't go back to sleep even 30 min later better Daytime in chest tightness resolves  completely while on prednisone last took one month prior to OV  And doesn't have it now but has exact same noct symptoms and overusing saba  Doe x mailbox and back to house out of breath and coughing  >>change dulera 200 to 100   08/30/2014 NP Follow up and Med review : Asthma Patient presents for a one-month follow-up We reviewed all her medications organize them into a medication count with patient education Patient has had  multiple medication changes recently She has been having difficulty with resistant hypertension and visual changes. Recently started on primidone and spironolactone . However, has not started this as of yet That she needs to go pick this up at the pharmacy. Neuro notes reviewed , has been referred to NS . B/p is elevated again at todays visit, discussed follow up with PCP for b/p management.  She tried to decrease her Dulera as recommended from last ov with Dr. Melvyn Novas    however, was unable to and is back on 200 dose .  Says overall her breathing is doing ok  No flare of cough or wheezing  Does have a lot of drainage in throat . Discussed starting on chlortrimeton.  rec Continue Dulera 200 2 puffs Twice daily  , rinse after use  Follow med calendar closely and bring to each visit.  Follow up with Primary MD concerning elevated blood pressure    10/05/2014 f/u ov/Wert re: asthma with component of uacs/ no med calendar  Chief Complaint  Patient presents with  . Follow-up    Here to discuss meds- Dulera not on her drug formulary. Pt states breathing is doing well and denies any new co's today.  turns out using neb at least once every 24 h, esp between 2 and 4 am when wakes up with severe dry cough x months/ pred helps some but never eliminated noct cough rec symbicort 160 Take 2 puffs first thing in am and then another 2 puffs about 12 hours later.  Try taking the tramadol around 2 am to see if helps with the early am cough/ wheeze  See calendar for specific medication instructions and bring it back for each and every office visit  Separating the top medications from the bottom group is fundamental to providing you adequate care going forward.   Please schedule a follow up office visit in 2 weeks, sooner if needed with all medications in 2 separate bags    10/19/2014 f/u ov/Wert re:  Asthma vs uacs/ not using med calendar correclty  Chief Complaint  Patient presents with  . Follow-up     Cough has changed sicnce starting symbicort- prod cough with "dirty" sputum. She states she feels that she can tell when her PM dose is due b/c she starts feeling SOB. She is using proair once per day on average.   not using symbicort 160 correctly  rec Prednisone 10 mg take  4 each am x 2 days,  2 each am x 2 days,  1 each am x 2 days and stop  Bystolic  10 mg take twice daily until return  Take 2 chlortrimeton at bedtime and 2 more 4 hours later  Continue symbicort 160 Take 2 puffs first thing in am and then another 2 puffs about 12 hours later.  Only use your albuterol as rescue  Only use nebulizer if you try proventil first and it fails to work    11/08/2014 Follow up : Asthma /UAC   Pt returns for follow up .  Last ov with asthma flare , tx with pred taper  She is feeling better Breathing is doing better and back to her baseline  Still has dry cough but some better.  We reviewed all her meds and organized them into a med calendar with pt education  Appears to be taking correctly although she is on over 30 medications .  B/p is improved on current regimen.  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 Follow-up    C/o increase SOB, prod cough (tan color phlem), wheezing, chest tx, nasal cong  worse x one week but at baseline doe x more than slow walk and neb q day around 4 am as the hfa not helping with noct symptoms no better even on prednisone rec Prednisone 10 mg take  4 each am x 2 days,  2 each am x 2 days,  1 each am x 2 days and stop      03/24/2015 f/u  Pulmonary office visit/ Wert  Asthma vs vcd Chief Complaint  Patient presents with  . Follow-up    Pt c/o increased cough with tan mucus, wheeze and SOB. Pt reports increase in symptoms with weather changes. Pt has used albuterol HFA with some symptom relief. Pt denies f/n/v/d.   2 weeks prior to OV  Noted needing the noct neb again  where this problem had resolved on pred  then 2 d after that bad cough p [employer] shopping and worse since then but still comfortable p saba rx rec Prednisone 10 mg take  4 each am x 2 days,   2 each am x 2 days,  1 each am x 2 days and stop  For cough use mucinex dm up to 1200 mg every 12 hours and the flutter valve as much as possible and if still can't control the cough > tramadol as per action plan on med calendar    06/29/2015  f/u ov/Wert re: asthma and unrelated cough / no med calendar  Chief Complaint  Patient presents with  . Follow-up    Cough has not improved since she was last seen.  She is using albuterol inhaler 2 x daily and neb with albuterol 1 x daily on average.   MCT did not reproduce the cough but the sob and wheeze  Unless w/in two weeks of taking prednisone, resumes 4 am wheezing/ gasping for air> almost every night needs to back up proair with neb  On dex daily taken before supper  avg use of tramadol is 6 x in 24 h unless on pred  Prednisone 10 mg take  4 each am x 2 days,   2 each am x 2 days,  1 each am x 2 days and stop  Qvar 80 Take 2 puffs first thing in am and then another 2 puffs about 12 hours later.  change dexilant Take 30-60 min before first meal of the day and Add Pepcid 20 mg  at bedtime  For drainage / throat tickle try take CHLORPHENIRAMINE  4 mg - take one every 4 hours as needed - available over the counter- may cause drowsiness so start with just a bedtime dose or two and see how you tolerate it before trying in daytime   See Chiyeko Ferre NP w/in 2 weeks(or next available after than)  with all your medications,  At f/u (given one sample = 120 pffs)     07/18/2015 NP  Follow up : Asthma vs Cough  Pt returns for 2 week follow up . Seen last ov with Asthma flare , tx w/ steroid taper.  She is feeling better. Still has some lingering cough on/off.  QVAR was added to her symbicort .  We reviewed all her meds and organized them into a med calendar with pt  education.  Appears to be taking correctly rec Follow med calendar    08/31/2015 acute extended ov/Wert re: asthma flare on maint rx with symb 160/qvar 80 2bid Chief Complaint  Patient presents with  . Acute Visit    Pt c/o wheezing, increased SOB, chest tightness and cough with white sputum x 2 days.   baseline still needed albuterol with activity regardless of time of day  Before was using neb nightly but that improved p last ov then p  cleaning out storage shed >  Tongue felt full /sob wheezing > 2 ER Trips 08/19/15  > benadryl/ pepcid no pred >  08/29/15 returned to ER > mgs02/ prednisone at 40 mg per day/ only used saba hfa today / cough less of a problem with this flare  rec Prednisone 40 mg x 4 days and 20 mg x 4 days and 10 mg daily x 4 days and off Add pepcid 20 mg one at bedtime  For cough > mucinex dm up to 1200 mg every 12 hours, supplement with tramadol and use the flutter valve as much as possible    11/17/2015  f/u ov/Wert re: 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 Follow-up    pt c/o prod cough with beige mucus, sob with exertion.     just using albuterol about half way thru each day for sob  Still wakes every morning around 4 am coughing unless actively taking prednisone and helps x 2 weeks then gradually worse again. 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 Follow-up    4wk rov   doing  better though still wakes up nightly with cough / denies using tramadol or albuterol much at all now   >>no changes   02/08/16 Follow up : Cough variant asthma  Pt returns for follow up . Says her cough has picked back up last few weeks.  Was doing well and controlled on  tramadol most days.  She ran out of tramadol few weeks ago. Seen by PCP given hydromet which helps as well.  Would like refill of tramadol . We discussed chronic narcotic use w/ pt education.  Discussed additonal medication to help with chronic cough control.  She denies chest pain , orthopnea, edema or fever.   Care everywhere notes reviewed from neurosurgery from [facility] , recent right frontal VP shunt     Current Medications, Allergies, Complete Past Medical History, Past Surgical History, Family History, and Social History were reviewed in [employer] record.  ROS  The following are not active complaints unless bolded sore throat, dysphagia, dental problems, itching, sneezing,  nasal congestion or excess/ purulent secretions, ear ache,   fever, chills, sweats, unintended wt loss, classically pleuritic or exertional cp,  orthopnea pnd or leg swelling, presyncope, palpitations, abdominal pain, anorexia, nausea, vomiting, diarrhea  or change in bowel or bladder habits, change in stools or urine, dysuria,hematuria,  rash, arthralgias, visual complaints, headache, numbness, weakness or ataxia or problems with walking or coordination,  change in mood/affect or memory.                 Objective:   Physical Exam  Obese bf nad  vital signs reviewed     Wt 213  12/18/10 > 01/03/2011  214 > 01/31/2011  206 >  09/11/2012 209 >208 6/19 > 210 09/25/2012 > 11/20/2012 211 >208 12/07/12> 01/12/2013 209 > 02/09/2013 210 >207 04/06/2013 > 07/14/2013  213>212 07/28/2013 >204 01/26/2014> 04/04/2014 206  >205 04/18/2014 >  07/18/2014 201 >  08/01/14 201 >198 08/30/2014 > 10/05/14  195 > 10/19/2014 192 > 191 11/08/2014 > 02/10/2015  198 >  03/24/2015   198 > 06/29/2015   201 > 08/31/2015  204 > 11/17/2015  197 > 12/15/2015  188    HEENT: nl dentition, turbinates, and orophanx. Nl external ear canals without cough reflex,     NECK :  without JVD/Nodes/TM/ nl carotid upstrokes bilaterally   LUNGS: lungs clear bilaterally to A and P    CV:  RRR  no s3 or murmur or increase in P2, no edema   ABD:  soft and nontender with nl excursion in the supine position. No bruits or organomegaly, bowel sounds nl  MS:  warm without deformities, calf tenderness, cyanosis or clubbing         CXR:  08/29/15 No evidence of acute cardiopulmonary disease.    Duriel Deery NP-C  [facility]

## 2016-02-09 ENCOUNTER — Encounter: Payer: Self-pay | Admitting: Internal Medicine

## 2016-02-09 ENCOUNTER — Other Ambulatory Visit: Payer: Self-pay | Admitting: Adult Health

## 2016-02-12 NOTE — Assessment & Plan Note (Addendum)
Flare  Cont on tramadol , pt education given  Add Delsym   Plan  Patient Instructions  May use Delsym 2 tsp Twice daily  As needed cough .  Add Tramadol 50mg  1 every 6hr as needed for cough - try to limit use as able .  Sips of water to soothe cough/throat.  follow up Dr. Melvyn Novas  In 6 weeks and As needed

## 2016-02-13 ENCOUNTER — Telehealth: Payer: Self-pay

## 2016-02-13 ENCOUNTER — Other Ambulatory Visit (INDEPENDENT_AMBULATORY_CARE_PROVIDER_SITE_OTHER): Payer: Medicare Other

## 2016-02-13 ENCOUNTER — Encounter: Payer: Self-pay | Admitting: Internal Medicine

## 2016-02-13 ENCOUNTER — Ambulatory Visit (INDEPENDENT_AMBULATORY_CARE_PROVIDER_SITE_OTHER): Payer: Medicare Other | Admitting: Internal Medicine

## 2016-02-13 VITALS — BP 160/110 | HR 116 | Temp 98.6°F | Resp 16 | Ht 66.0 in | Wt 189.0 lb

## 2016-02-13 DIAGNOSIS — I10 Essential (primary) hypertension: Secondary | ICD-10-CM

## 2016-02-13 DIAGNOSIS — R05 Cough: Secondary | ICD-10-CM | POA: Diagnosis not present

## 2016-02-13 DIAGNOSIS — R058 Other specified cough: Secondary | ICD-10-CM

## 2016-02-13 DIAGNOSIS — Z87892 Personal history of anaphylaxis: Secondary | ICD-10-CM | POA: Insufficient documentation

## 2016-02-13 DIAGNOSIS — E876 Hypokalemia: Secondary | ICD-10-CM

## 2016-02-13 LAB — BASIC METABOLIC PANEL
BUN: 16 mg/dL (ref 6–23)
CHLORIDE: 102 meq/L (ref 96–112)
CO2: 32 mEq/L (ref 19–32)
Calcium: 10 mg/dL (ref 8.4–10.5)
Creatinine, Ser: 1.31 mg/dL — ABNORMAL HIGH (ref 0.40–1.20)
GFR: 57.15 mL/min — ABNORMAL LOW (ref >60.00)
Glucose, Bld: 83 mg/dL (ref 70–99)
POTASSIUM: 3.6 meq/L (ref 3.5–5.1)
Sodium: 141 mEq/L (ref 135–145)

## 2016-02-13 LAB — MAGNESIUM: MAGNESIUM: 2.1 mg/dL (ref 1.5–2.5)

## 2016-02-13 MED ORDER — TRAMADOL HCL 50 MG PO TABS: 50.0000 mg | ORAL_TABLET | Freq: Four times a day (QID) | ORAL | 5 refills | Status: DC | PRN

## 2016-02-13 MED ORDER — AMLODIPINE BESYLATE 10 MG PO TABS: 10.0000 mg | ORAL_TABLET | Freq: Every day | ORAL | 1 refills | Status: DC

## 2016-02-13 NOTE — Patient Instructions (Signed)
Hypertension Hypertension, commonly called high blood pressure, is when the force of blood pumping through your arteries is too strong. Your arteries are the blood vessels that carry blood from your heart throughout your body. A blood pressure reading consists of a higher number over a lower number, such as 110/72. The higher number (systolic) is the pressure inside your arteries when your heart pumps. The lower number (diastolic) is the pressure inside your arteries when your heart relaxes. Ideally you want your blood pressure below 120/80. Hypertension forces your heart to work harder to pump blood. Your arteries may become narrow or stiff. Having untreated or uncontrolled hypertension can cause heart attack, stroke, kidney disease, and other problems. What increases the risk? Some risk factors for high blood pressure are controllable. Others are not. Risk factors you cannot control include:  Race. You may be at higher risk if you are African American.  Age. Risk increases with age.  Gender. Men are at higher risk than women before age 45 years. After age 65, women are at higher risk than men. Risk factors you can control include:  Not getting enough exercise or physical activity.  Being overweight.  Getting too much fat, sugar, calories, or salt in your diet.  Drinking too much alcohol. What are the signs or symptoms? Hypertension does not usually cause signs or symptoms. Extremely high blood pressure (hypertensive crisis) may cause headache, anxiety, shortness of breath, and nosebleed. How is this diagnosed? To check if you have hypertension, your health care provider will measure your blood pressure while you are seated, with your arm held at the level of your heart. It should be measured at least twice using the same arm. Certain conditions can cause a difference in blood pressure between your right and left arms. A blood pressure reading that is higher than normal on one occasion does  not mean that you need treatment. If it is not clear whether you have high blood pressure, you may be asked to return on a different day to have your blood pressure checked again. Or, you may be asked to monitor your blood pressure at home for 1 or more weeks. How is this treated? Treating high blood pressure includes making lifestyle changes and possibly taking medicine. Living a healthy lifestyle can help lower high blood pressure. You may need to change some of your habits. Lifestyle changes may include:  Following the DASH diet. This diet is high in fruits, vegetables, and whole grains. It is low in salt, red meat, and added sugars.  Keep your sodium intake below 2,300 mg per day.  Getting at least 30-45 minutes of aerobic exercise at least 4 times per week.  Losing weight if necessary.  Not smoking.  Limiting alcoholic beverages.  Learning ways to reduce stress. Your health care provider may prescribe medicine if lifestyle changes are not enough to get your blood pressure under control, and if one of the following is true:  You are 18-59 years of age and your systolic blood pressure is above 140.  You are 60 years of age or older, and your systolic blood pressure is above 150.  Your diastolic blood pressure is above 90.  You have diabetes, and your systolic blood pressure is over 140 or your diastolic blood pressure is over 90.  You have kidney disease and your blood pressure is above 140/90.  You have heart disease and your blood pressure is above 140/90. Your personal target blood pressure may vary depending on your medical   conditions, your age, and other factors. Follow these instructions at home:  Have your blood pressure rechecked as directed by your health care provider.  Take medicines only as directed by your health care provider. Follow the directions carefully. Blood pressure medicines must be taken as prescribed. The medicine does not work as well when you skip  doses. Skipping doses also puts you at risk for problems.  Do not smoke.  Monitor your blood pressure at home as directed by your health care provider. Contact a health care provider if:  You think you are having a reaction to medicines taken.  You have recurrent headaches or feel dizzy.  You have swelling in your ankles.  You have trouble with your vision. Get help right away if:  You develop a severe headache or confusion.  You have unusual weakness, numbness, or feel faint.  You have severe chest or abdominal pain.  You vomit repeatedly.  You have trouble breathing. This information is not intended to replace advice given to you by your health care provider. Make sure you discuss any questions you have with your health care provider. Document Released: 03/18/2005 Document Revised: 08/24/2015 Document Reviewed: 01/08/2013 Elsevier Interactive Patient Education  2017 [employer]  

## 2016-02-13 NOTE — Progress Notes (Signed)
Pre visit review using our clinic review tool, if applicable. No additional management support is needed unless otherwise documented below in the visit note. 

## 2016-02-13 NOTE — Progress Notes (Signed)
Subjective:  Patient ID: Erica [name], female    DOB: [birth year]  Age: 40 y.o. MRN: [id number]  CC: Hypertension   HPI [name] presents for a BP check. She had a CNS shunt placed a few weeks ago at [facility] for help with pseudotumor cerebri. She does not have a headache but she says the shunt on the right side of her scalp feels uncomfortable. She says her blood pressures have not been well controlled and she is not sure whether she is taking all of her medications. She complains of chronic, recurrent nonproductive cough and wants something to help with the cough. She denies nausea, vomiting, blurred vision, poor seizures, ataxia, or syncope.  Outpatient Medications Prior to Visit  Medication Sig Dispense Refill  . acetaZOLAMIDE (DIAMOX) 250 MG tablet Take 4 tablets (1,000 mg total) by mouth 3 (three) times daily. 400 tablet 11  . albuterol (PROVENTIL) (2.5 MG/3ML) 0.083% nebulizer solution USE 1 VIAL VIA NEBULIZER EVERY 4 HOURS AS NEEDED( NEED APPOINTMENT) 300 mL 2  . beclomethasone (QVAR) 80 MCG/ACT inhaler Inhale 2 puffs into the lungs 2 (two) times daily. 1 Inhaler 5  . cloNIDine (CATAPRES) 0.3 MG tablet TAKE 1 TABLET BY MOUTH TWICE DAILY 180 tablet 3  . dexlansoprazole (DEXILANT) 60 MG capsule Take 1 capsule (60 mg total) by mouth daily. 30 capsule 5  . dicyclomine (BENTYL) 20 MG tablet Take 1 tablet (20 mg total) by mouth 3 (three) times daily as needed for spasms. 90 tablet 2  . diltiazem (CARDIZEM LA) 420 MG 24 hr tablet Take 1 tablet (420 mg total) by mouth daily. 90 tablet 3  . DULoxetine (CYMBALTA) 60 MG capsule Take 1 capsule (60 mg total) by mouth daily. 90 capsule 3  . EPINEPHrine 0.3 mg/0.3 mL IJ SOAJ injection Inject 0.3 mLs (0.3 mg total) into the muscle once as needed (anaphylaxis reaction). 2 Device 5  . Eszopiclone 3 MG TABS Take 1 tablet (3 mg total) by mouth at bedtime as needed. 30 tablet 3  . Fluocinonide Emulsified Base 0.05 % CREA APPLY EXTERNALLY TO THE AFFECTED  AREA TWICE DAILY 60 g 1  . hydrALAZINE (APRESOLINE) 100 MG tablet TAKE 1 TABLET(100 MG) BY MOUTH THREE TIMES DAILY 90 tablet 0  . hydrochlorothiazide (MICROZIDE) 12.5 MG capsule TAKE 1 CAPSULE(12.5 MG) BY MOUTH DAILY 90 capsule 1  . lidocaine (XYLOCAINE) 5 % ointment Apply 1 application topically 2 (two) times daily as needed. 50 g 5  . metoCLOPramide (REGLAN) 10 MG tablet TAKE 1 TABLET BY MOUTH FOUR TIMES DAILY AT BEDTIME WITH MEALS 120 tablet 5  . mometasone (NASONEX) 50 MCG/ACT nasal spray Place 2 sprays into the nose 2 (two) times daily. 17 g 5  . montelukast (SINGULAIR) 10 MG tablet TAKE 1 TABLET BY MOUTH EVERY NIGHT AT BEDTIME 90 tablet 1  . Nebivolol HCl (BYSTOLIC) 20 MG TABS 2 tabs by mouth per day 180 tablet 3  . NITROSTAT 0.4 MG SL tablet PLACE 1 TABLET UNDER TONGUE EVERY 5 MINUTES AS NEEDED FOR CHEST PAIN, MAX OF 3 TABLETS THEN CALL 911 IF PAIN PERSISTS 100 tablet 0  . OXYGEN Use CPAP at bedtime    . potassium chloride SA (K-DUR,KLOR-CON) 20 MEQ tablet Take 1 tablet (20 mEq total) by mouth 3 (three) times daily. 90 tablet 5  . primidone (MYSOLINE) 50 MG tablet Take 50 mg by mouth 2 (two) times daily.     . promethazine (PHENERGAN) 12.5 MG tablet Take 1-2 tablets by mouth every  8 hours as needed. Beware of sedation 50 tablet 2  . ranolazine (RANEXA) 500 MG 12 hr tablet Take 1 tablet (500 mg total) by mouth 2 (two) times daily. 180 tablet 1  . Respiratory Therapy Supplies (FLUTTER) DEVI Use as directed 1 each 0  . spironolactone (ALDACTONE) 25 MG tablet Take 1 tablet (25 mg total) by mouth daily. 90 tablet 3  . SYMBICORT 160-4.5 MCG/ACT inhaler INHALE 2 PUFFS BY MOUTH TWICE DAILY 10.2 g 0  . topiramate (TOPAMAX) 25 MG tablet Take 25 mg by mouth 2 (two) times daily.    . traZODone (DESYREL) 150 MG tablet Take 50 mg by mouth at bedtime.   1  . VENTOLIN HFA 108 (90 Base) MCG/ACT inhaler INHALE 2 PUFFS INTO THE LUNGS EVERY 4 HOURS AS NEEDED FOR WHEEZING OR SHORTNESS OF BREATH 18 g 5  .  amitriptyline (ELAVIL) 100 MG tablet Take 1 tablet (100 mg total) by mouth at bedtime. 90 tablet 1  . amLODipine (NORVASC) 10 MG tablet TAKE 1 TABLET(10 MG) BY MOUTH DAILY 90 tablet 0  . Azilsartan-Chlorthalidone (EDARBYCLOR) 40-25 MG TABS Take 1 tablet by mouth daily. 21 tablet 0  . Biotin 10 MG TABS Take 1 tablet by mouth every morning.     Marland Kitchen dextromethorphan-guaiFENesin (MUCINEX DM) 30-600 MG 12hr tablet Take 1 tablet by mouth 2 (two) times daily. 30 tablet 0  . fluocinonide-emollient (LIDEX-E) 0.05 % cream APPLY EXTERNALLY TO THE AFFECTED AREA TWICE DAILY 60 g 0  . ondansetron (ZOFRAN) 4 MG tablet Take 1 tablet (4 mg total) by mouth every 6 (six) hours. 20 tablet 0  . traMADol (ULTRAM) 50 MG tablet Take 1 tablet (50 mg total) by mouth every 6 (six) hours as needed. 60 tablet 0  . SYMBICORT 160-4.5 MCG/ACT inhaler INHALE 2 PUFFS BY MOUTH TWICE DAILY 10.2 g 4  . goserelin (ZOLADEX) injection 3.6 mg      No facility-administered medications prior to visit.     ROS Review of Systems  Constitutional: Negative for activity change, appetite change, diaphoresis, fatigue and unexpected weight change.  HENT: Negative.   Eyes: Negative for photophobia and visual disturbance.  Respiratory: Positive for cough. Negative for choking, chest tightness, shortness of breath and stridor.   Cardiovascular: Negative.  Negative for chest pain, palpitations and leg swelling.  Gastrointestinal: Negative.  Negative for abdominal pain, constipation, diarrhea, nausea and vomiting.  Endocrine: Negative.   Genitourinary: Negative.   Musculoskeletal: Negative.  Negative for back pain and neck pain.  Skin: Negative.   Allergic/Immunologic: Negative.   Neurological: Negative.  Negative for dizziness, tremors, weakness, light-headedness, numbness and headaches.  Hematological: Negative.  Negative for adenopathy. Does not bruise/bleed easily.  Psychiatric/Behavioral: Negative.  Negative for decreased concentration,  dysphoric mood and sleep disturbance. The patient is not nervous/anxious.     Objective:  BP (!) 160/110 (BP Location: Left Arm, Patient Position: Sitting, Cuff Size: Large)   Pulse (!) 116   Temp 98.6 F (37 C) (Oral)   Resp 16   Ht 5\' 6"  (1.676 m)   Wt 189 lb (85.7 kg)   SpO2 97%   BMI 30.51 kg/m   BP Readings from Last 3 Encounters:  02/13/16 (!) 160/110  02/08/16 (!) 148/82  01/31/16 164/98    Wt Readings from Last 3 Encounters:  02/13/16 189 lb (85.7 kg)  02/08/16 189 lb 6.4 oz (85.9 kg)  01/31/16 180 lb 8 oz (81.9 kg)    Physical Exam  Constitutional: She is oriented to  person, place, and time. No distress.  HENT:  Head:    Mouth/Throat: Oropharynx is clear and moist. No oropharyngeal exudate.  Eyes: Conjunctivae are normal. Right eye exhibits no discharge. Left eye exhibits no discharge. No scleral icterus.  Neck: Normal range of motion. Neck supple. No JVD present. No tracheal deviation present. No thyromegaly present.  Cardiovascular: Normal rate, regular rhythm, normal heart sounds and intact distal pulses.  Exam reveals no gallop and no friction rub.   No murmur heard. Pulmonary/Chest: Effort normal and breath sounds normal. No stridor. No respiratory distress. She has no wheezes. She has no rales. She exhibits no tenderness.  Abdominal: Soft. Bowel sounds are normal. She exhibits no distension and no mass. There is no tenderness. There is no rebound and no guarding.  Musculoskeletal: Normal range of motion. She exhibits no edema, tenderness or deformity.  Lymphadenopathy:    She has no cervical adenopathy.  Neurological: She is alert and oriented to person, place, and time. She has normal reflexes. She displays normal reflexes. No cranial nerve deficit. She exhibits normal muscle tone. Coordination normal.  Skin: Skin is warm and dry. No rash noted. She is not diaphoretic. No erythema. No pallor.  Psychiatric: She has a normal mood and affect. Her behavior is  normal. Judgment and thought content normal.  Vitals reviewed.   Lab Results  Component Value Date   WBC 7.4 01/31/2016   HGB 14.2 01/31/2016   HCT 41.8 01/31/2016   PLT 249 01/31/2016   GLUCOSE 83 02/13/2016   CHOL 206 (H) 06/15/2015   TRIG 110.0 06/15/2015   HDL 44.10 06/15/2015   LDLCALC 140 (H) 06/15/2015   ALT 28 01/31/2016   AST 15 01/31/2016   NA 141 02/13/2016   K 3.6 02/13/2016   CL 102 02/13/2016   CREATININE 1.31 (H) 02/13/2016   BUN 16 02/13/2016   CO2 32 02/13/2016   TSH 1.06 06/15/2015   INR 1.1 (H) 06/03/2012   HGBA1C 5.6 08/22/2015    Ct Head Wo Contrast  Result Date: 01/31/2016 CLINICAL DATA:  Recent ventriculoperitoneal shunt placement. Nausea and vomiting, acute onset. Initial encounter. EXAM: CT HEAD WITHOUT CONTRAST TECHNIQUE: Contiguous axial images were obtained from the base of the skull through the vertex without intravenous contrast. COMPARISON:  CT of the head performed 09/20/2014, and MRI brain from 05/18/2015 FINDINGS: Brain: No evidence of acute infarction, hemorrhage, hydrocephalus, extra-axial collection or mass lesion/mass effect. The patient's right frontal ventriculoperitoneal shunt is noted ending at the right lateral ventricle. The posterior fossa, including the cerebellum, brainstem and fourth ventricle, is within normal limits. The third and lateral ventricles, and basal ganglia are unremarkable in appearance. The cerebral hemispheres are symmetric in appearance, with normal gray-white differentiation. No mass effect or midline shift is seen. Vascular: No hyperdense vessel or unexpected calcification. Skull: There is no evidence of fracture; visualized osseous structures are unremarkable in appearance. Sinuses/Orbits: The visualized portions of the orbits are within normal limits. The paranasal sinuses and mastoid air cells are well-aerated. Other: No significant soft tissue abnormalities are seen. IMPRESSION: 1. No acute intracranial pathology  seen on CT. 2. Right frontal ventriculoperitoneal shunt is grossly unremarkable in appearance. Electronically Signed   By: Garald Balding M.D.   On: 01/31/2016 18:18    Assessment & Plan:   C[place]anda was seen today for hypertension.  Diagnoses and all orders for this visit:  Essential hypertension- her blood pressure is not well controlled, I have asked her to be compliant with her medications  for the next few weeks and then return for blood pressure check. If her blood pressure continues to be poorly controlled and I will recommend making some changes. -     Basic metabolic panel; Future -     Discontinue: amLODipine (NORVASC) 10 MG tablet; Take 1 tablet (10 mg total) by mouth daily.  Hypokalemia- improvement noted, I have asked her to continue the potassium replacement therapy. -     Basic metabolic panel; Future -     Magnesium; Future  Upper airway cough syndrome- I have recommended that she control the cough with tramadol as needed. -     traMADol (ULTRAM) 50 MG tablet; Take 1 tablet (50 mg total) by mouth every 6 (six) hours as needed.   I have discontinued Ms. Jefferys's Biotin, amLODipine, amitriptyline, Azilsartan-Chlorthalidone, fluocinonide-emollient, dextromethorphan-guaiFENesin, ondansetron, and amLODipine. I am also having her maintain her FLUTTER, montelukast, diltiazem, OXYGEN, mometasone, beclomethasone, albuterol, topiramate, cloNIDine, EPINEPHrine, spironolactone, NITROSTAT, DULoxetine, lidocaine, VENTOLIN HFA, primidone, Nebivolol HCl, traZODone, potassium chloride SA, acetaZOLAMIDE, Eszopiclone, dicyclomine, promethazine, metoCLOPramide, dexlansoprazole, ranolazine, hydrochlorothiazide, hydrALAZINE, Fluocinonide Emulsified Base, SYMBICORT, and traMADol. We will stop administering goserelin.  Meds ordered this encounter  Medications  . traMADol (ULTRAM) 50 MG tablet    Sig: Take 1 tablet (50 mg total) by mouth every 6 (six) hours as needed.    Dispense:  60 tablet    Refill:   5  . DISCONTD: amLODipine (NORVASC) 10 MG tablet    Sig: Take 1 tablet (10 mg total) by mouth daily.    Dispense:  90 tablet    Refill:  1     Follow-up: Return in about 4 weeks (around 03/12/2016).  Scarlette Calico, MD

## 2016-02-13 NOTE — Telephone Encounter (Signed)
PA initiated and APPROVED 11/15/2015 - 02/12/2017 via [place] ref # [id number].  Pharmacy advised via fax

## 2016-02-14 ENCOUNTER — Other Ambulatory Visit: Payer: Self-pay | Admitting: *Deleted

## 2016-02-14 DIAGNOSIS — C50412 Malignant neoplasm of upper-outer quadrant of left female breast: Secondary | ICD-10-CM

## 2016-02-15 ENCOUNTER — Telehealth: Payer: Self-pay | Admitting: Emergency Medicine

## 2016-02-15 ENCOUNTER — Other Ambulatory Visit: Payer: Medicare Other

## 2016-02-15 ENCOUNTER — Ambulatory Visit: Payer: Medicare Other

## 2016-02-15 NOTE — Telephone Encounter (Signed)
[facility] called and can not get a hold of patient. They are going to discharge patient. If patient needs more PT he recommended outpatient PT. Thanks.

## 2016-02-15 NOTE — Telephone Encounter (Signed)
Pls advise if needed.

## 2016-02-19 DIAGNOSIS — H4710 Unspecified papilledema: Secondary | ICD-10-CM | POA: Diagnosis not present

## 2016-02-19 DIAGNOSIS — I129 Hypertensive chronic kidney disease with stage 1 through stage 4 chronic kidney disease, or unspecified chronic kidney disease: Secondary | ICD-10-CM | POA: Diagnosis not present

## 2016-02-19 DIAGNOSIS — G932 Benign intracranial hypertension: Secondary | ICD-10-CM | POA: Diagnosis not present

## 2016-02-19 DIAGNOSIS — N189 Chronic kidney disease, unspecified: Secondary | ICD-10-CM | POA: Diagnosis not present

## 2016-02-20 ENCOUNTER — Encounter: Payer: Self-pay | Admitting: Cardiology

## 2016-02-20 NOTE — Telephone Encounter (Signed)
She has been to her PCP on 11/14 and asked for narcotics as well, only one prescriber can do it and in this case is her PCP, if she is not getting better, she should get back to him.     ----- Message -----  From: Nuala Alpha, LPN  Sent: [id number]  9:54 AM  To: [name], MD  Subject: FW: Non-Urgent Medical Question

## 2016-03-04 ENCOUNTER — Encounter: Payer: Self-pay | Admitting: Internal Medicine

## 2016-03-04 DIAGNOSIS — R51 Headache: Secondary | ICD-10-CM | POA: Diagnosis not present

## 2016-03-04 DIAGNOSIS — Z9889 Other specified postprocedural states: Secondary | ICD-10-CM | POA: Diagnosis not present

## 2016-03-04 DIAGNOSIS — G932 Benign intracranial hypertension: Secondary | ICD-10-CM | POA: Diagnosis not present

## 2016-03-12 ENCOUNTER — Telehealth: Payer: Self-pay | Admitting: *Deleted

## 2016-03-12 NOTE — Telephone Encounter (Signed)
Telephone call to pt she has had a lot going on recently. She has not had the opportunity to follow up with her GYN per last OV. Pt will give them a call today to try to schedule an appointment.

## 2016-03-14 ENCOUNTER — Other Ambulatory Visit (HOSPITAL_BASED_OUTPATIENT_CLINIC_OR_DEPARTMENT_OTHER): Payer: Medicare Other

## 2016-03-14 ENCOUNTER — Ambulatory Visit (HOSPITAL_BASED_OUTPATIENT_CLINIC_OR_DEPARTMENT_OTHER): Payer: Medicare Other

## 2016-03-14 VITALS — BP 180/95 | Temp 98.0°F | Resp 18

## 2016-03-14 DIAGNOSIS — Z5111 Encounter for antineoplastic chemotherapy: Secondary | ICD-10-CM | POA: Diagnosis present

## 2016-03-14 DIAGNOSIS — C50412 Malignant neoplasm of upper-outer quadrant of left female breast: Secondary | ICD-10-CM

## 2016-03-14 LAB — CBC WITH DIFFERENTIAL/PLATELET
BASO%: 0.3 % (ref 0.0–2.0)
BASOS ABS: 0 10*3/uL (ref 0.0–0.1)
EOS ABS: 0.1 10*3/uL (ref 0.0–0.5)
EOS%: 0.8 % (ref 0.0–7.0)
HEMATOCRIT: 41.1 % (ref 34.8–46.6)
HEMOGLOBIN: 14.1 g/dL (ref 11.6–15.9)
LYMPH#: 1.7 10*3/uL (ref 0.9–3.3)
LYMPH%: 28.4 % (ref 14.0–49.7)
MCH: 30.3 pg (ref 25.1–34.0)
MCHC: 34.3 g/dL (ref 31.5–36.0)
MCV: 88.4 fL (ref 79.5–101.0)
MONO#: 0.4 10*3/uL (ref 0.1–0.9)
MONO%: 6.2 % (ref 0.0–14.0)
NEUT%: 64.3 % (ref 38.4–76.8)
NEUTROS ABS: 3.9 10*3/uL (ref 1.5–6.5)
PLATELETS: 210 10*3/uL (ref 145–400)
RBC: 4.65 10*6/uL (ref 3.70–5.45)
RDW: 13.5 % (ref 11.2–14.5)
WBC: 6.1 10*3/uL (ref 3.9–10.3)

## 2016-03-14 LAB — COMPREHENSIVE METABOLIC PANEL
ALBUMIN: 3.5 g/dL (ref 3.5–5.0)
ALK PHOS: 86 U/L (ref 40–150)
ALT: 26 U/L (ref 0–55)
ANION GAP: 10 meq/L (ref 3–11)
AST: 16 U/L (ref 5–34)
BILIRUBIN TOTAL: 0.56 mg/dL (ref 0.20–1.20)
BUN: 12.9 mg/dL (ref 7.0–26.0)
CALCIUM: 9.6 mg/dL (ref 8.4–10.4)
CO2: 26 mEq/L (ref 22–29)
Chloride: 104 mEq/L (ref 98–109)
Creatinine: 1.1 mg/dL (ref 0.6–1.1)
EGFR: 71 mL/min/{1.73_m2} — AB (ref >90)
GLUCOSE: 112 mg/dL (ref 70–140)
Potassium: 3.6 mEq/L (ref 3.5–5.1)
Sodium: 141 mEq/L (ref 136–145)
TOTAL PROTEIN: 7.6 g/dL (ref 6.4–8.3)

## 2016-03-14 MED ORDER — GOSERELIN ACETATE 3.6 MG ~~LOC~~ IMPL
3.6000 mg | DRUG_IMPLANT | Freq: Once | SUBCUTANEOUS | Status: AC
  Administered 2016-03-14: 3.6 mg via SUBCUTANEOUS
  Filled 2016-03-14: qty 3.6

## 2016-03-14 NOTE — Patient Instructions (Signed)
Goserelin injection What is this medicine? GOSERELIN (GOE se rel in) is similar to a hormone found in the body. It lowers the amount of sex hormones that the body makes. Men will have lower testosterone levels and women will have lower estrogen levels while taking this medicine. In men, this medicine is used to treat prostate cancer; the injection is either given once per month or once every 12 weeks. A once per month injection (only) is used to treat women with endometriosis, dysfunctional uterine bleeding, or advanced breast cancer. This medicine may be used for other purposes; ask your health care provider or pharmacist if you have questions. What should I tell my health care provider before I take this medicine? They need to know if you have any of these conditions (some only apply to women): -diabetes -heart disease or previous heart attack -high blood pressure -high cholesterol -kidney disease -osteoporosis or low bone density -problems passing urine -spinal cord injury -stroke -tobacco smoker -an unusual or allergic reaction to goserelin, hormone therapy, other medicines, foods, dyes, or preservatives -pregnant or trying to get pregnant -breast-feeding How should I use this medicine? This medicine is for injection under the skin. It is given by a health care professional in a hospital or clinic setting. Men receive this injection once every 4 weeks or once every 12 weeks. Women will only receive the once every 4 weeks injection. Talk to your pediatrician regarding the use of this medicine in children. Special care may be needed. Overdosage: If you think you have taken too much of this medicine contact a poison control center or emergency room at once. NOTE: This medicine is only for you. Do not share this medicine with others. What if I miss a dose? It is important not to miss your dose. Call your doctor or health care professional if you are unable to keep an appointment. What may  interact with this medicine? -female hormones like estrogen -herbal or dietary supplements like black cohosh, chasteberry, or DHEA -female hormones like testosterone -prasterone This list may not describe all possible interactions. Give your health care provider a list of all the medicines, herbs, non-prescription drugs, or dietary supplements you use. Also tell them if you smoke, drink alcohol, or use illegal drugs. Some items may interact with your medicine. What should I watch for while using this medicine? Visit your doctor or health care professional for regular checks on your progress. Your symptoms may appear to get worse during the first weeks of this therapy. Tell your doctor or healthcare professional if your symptoms do not start to get better or if they get worse after this time. Your bones may get weaker if you take this medicine for a long time. If you smoke or frequently drink alcohol you may increase your risk of bone loss. A family history of osteoporosis, chronic use of drugs for seizures (convulsions), or corticosteroids can also increase your risk of bone loss. Talk to your doctor about how to keep your bones strong. This medicine should stop regular monthly menstration in women. Tell your doctor if you continue to menstrate. Women should not become pregnant while taking this medicine or for 12 weeks after stopping this medicine. Women should inform their doctor if they wish to become pregnant or think they might be pregnant. There is a potential for serious side effects to an unborn child. Talk to your health care professional or pharmacist for more information. Do not breast-feed an infant while taking this medicine. Men should   inform their doctors if they wish to father a child. This medicine may lower sperm counts. Talk to your health care professional or pharmacist for more information. What side effects may I notice from receiving this medicine? Side effects that you should  report to your doctor or health care professional as soon as possible: -allergic reactions like skin rash, itching or hives, swelling of the face, lips, or tongue -bone pain -breathing problems -changes in vision -chest pain -feeling faint or lightheaded, falls -fever, chills -pain, swelling, warmth in the leg -pain, tingling, numbness in the hands or feet -signs and symptoms of low blood pressure like dizziness; feeling faint or lightheaded, falls; unusually weak or tired -stomach pain -swelling of the ankles, feet, hands -trouble passing urine or change in the amount of urine -unusually high or low blood pressure -unusually weak or tired Side effects that usually do not require medical attention (report to your doctor or health care professional if they continue or are bothersome): -change in sex drive or performance -changes in breast size in both males and females -changes in emotions or moods -headache -hot flashes -irritation at site where injected -loss of appetite -skin problems like acne, dry skin -vaginal dryness This list may not describe all possible side effects. Call your doctor for medical advice about side effects. You may report side effects to FDA at 1-800-FDA-1088. Where should I keep my medicine? This drug is given in a hospital or clinic and will not be stored at home. NOTE: This sheet is a summary. It may not cover all possible information. If you have questions about this medicine, talk to your doctor, pharmacist, or health care provider.    2016, Elsevier/Gold Standard. (2013-05-25 11:10:35)  

## 2016-03-22 ENCOUNTER — Encounter: Payer: Self-pay | Admitting: Internal Medicine

## 2016-03-22 ENCOUNTER — Ambulatory Visit (INDEPENDENT_AMBULATORY_CARE_PROVIDER_SITE_OTHER): Payer: Medicare Other | Admitting: Internal Medicine

## 2016-03-22 VITALS — BP 138/90 | HR 99 | Ht 66.0 in | Wt 188.4 lb

## 2016-03-22 DIAGNOSIS — J45991 Cough variant asthma: Secondary | ICD-10-CM | POA: Diagnosis not present

## 2016-03-22 DIAGNOSIS — R05 Cough: Secondary | ICD-10-CM | POA: Diagnosis not present

## 2016-03-22 DIAGNOSIS — R058 Other specified cough: Secondary | ICD-10-CM

## 2016-03-22 LAB — NITRIC OXIDE: OTHER: 36

## 2016-03-22 MED ORDER — PREDNISONE 10 MG PO TABS: ORAL_TABLET | ORAL | 0 refills | Status: DC

## 2016-03-22 MED ORDER — ACETAMINOPHEN-CODEINE #4 300-60 MG PO TABS: 1.0000 | ORAL_TABLET | ORAL | 0 refills | Status: DC | PRN

## 2016-03-22 NOTE — Patient Instructions (Addendum)
Prednisone 10 mg take  4 each am x 2 days,   2 each am x 2 days,  1 each am x 2 days and stop   For cough take mucinex dm 1200 mg every 12 hours or delsym 2 tsp every 12 hours as needed and cough into the flutter valve   If still can't stop the cough, add tylenol #4 up to 1-2 every 4 hours -whatever it takes to completely stop the coughing x 3 straight days  GERD (REFLUX)  is an extremely common cause of respiratory symptoms just like yours , many times with no obvious heartburn at all.    It can be treated with medication, but also with lifestyle changes including elevation of the head of your bed (ideally with 6 inch  bed blocks),  Smoking cessation, avoidance of late meals, excessive alcohol, and avoid fatty foods, chocolate, peppermint, colas, red wine, and acidic juices such as orange juice.  NO MINT OR MENTHOL PRODUCTS SO NO COUGH DROPS  USE SUGARLESS CANDY INSTEAD (Jolley ranchers or Stover's or Life Savers) or even ice chips will also do - the key is to swallow to prevent all throat clearing. NO OIL BASED VITAMINS - use powdered substitutes.    See Tammy NP w/in 4 weeks with all your medications, even over the counter meds, separated in two separate bags, the ones you take no matter what vs the ones you stop once you feel better and take only as needed when you feel you need them.   Tammy  will generate for you a new user friendly medication calendar that will put [place] all on the same page re: your medication use.     Without this process, it simply isn't possible to assure that we are providing  your outpatient care  with  the attention to detail we feel you deserve.   If we cannot assure that you're getting that kind of care,  then we cannot manage your problem effectively from this clinic.  Once you have seen Tammy and we are sure that we're all on the same page with your medication use she will arrange follow up with me.

## 2016-03-22 NOTE — Progress Notes (Signed)
Subjective:    Patient ID: Erica [name], female     DOB: 16-Apr-1975    MRN: [id number]    Brief patient profile:  38 yobf with asthma all her life much worse since around 07/2010 referred by Dr [name] to pulmonary clinic in Sept 2012 Hx of Breast Cancer 05/2008    History of Present Illness  12/18/2010 Initial pulmonary office eval on acei and  advair cc dtc asthma x 4 months but baseline = excess saba use "even when better" at least twice daily despite daily advair, worse at hs in fact  used saba 3 x last night.  On chemo /RT for breast ca but last rx was Jan 2012 well before asthma worse.  To er multiple times >  no better with singular added x 3 months. rec Stop Lisinopril Dulera 200 Take 2 puffs first thing in am and then another 2 puffs about 12 hours later.  Work on inhaler technique  Nexium 40 mg   Take 30-60 min before first meal of the day and Pepcid 20 mg one bedtime until return Only use your albuterol as a rescue medication to be used if you can't catch your breath by resting or doing a relaxed purse lip breathing pattern. The less you use it, the better it will work when you need it.  Prednisone 10 mg take  4 each am x 2 days,   2 each am x 2 days,  1 each am x2days and stop    01/03/2011 f/u ov/Jayland Null cc much better off ace and advair but still needing saba twice daily on avg. No  Purulent sputum. rec no change in rx/ rule of 2's reviewed     04/06/2013 NP Follow up and Med Review   We reviewed all her meds and organized them into a med calendar. She does have Medicaid but depends on samples. Did not bring all her meds with her today , says they are at home.  Says her breathing is the same since last ov w/ SOB, Has on /off wheezing, some chest heaviness, prod cough with yellow mucus. Good and bad days. Wears out easily .  Denies f/c/s, nausea, vomiting.  Did not stop singulair as recommended.  rec  Follow med calendar closely and bring to each visit > did not do  We are  referring you for a CPSTwith spirometry before and after> never done   04/18/2014 NP Follow up and Med Review / NP  Patient returns for a two-week follow-up and medication review. We reviewed all her medications organize them into a medication count with patient education It appears that she is taking her medications correctly, however, she has over 30 medications and has had several changes recently. Last visit, her [name] was decreased to the 157mcg says that her breathing was better on the 241mcg Patient had a chest x-ray last visit that showed no acute changes She underwent a CT sinus due to persistent cough , which was positive for sinusitis on the right She was treated with a ten-day course of Avelox, she has one dose left Does feel some better but still has cough . She denies any hemoptysis, orthopnea, PND or leg swelling  Patient has upcoming sleep study. Next week for suspected underlying sleep apnea with snoring and daytime sleepiness rec Refer to ENT for sinusitis and chronic cough > never saw one> f/u sinus CT neg 07/26/14  Increase Dulera 200 2 puffs Twice daily   Follow med calendar closely  and bring to each visit.     08/01/2014 f/u ov/Kaimani Clayson re:  Asthma plus noct "gasping"  Chief Complaint  Patient presents with  . Follow-up    doing well.  no complaints.  not doing well x one year with noct / early  4am with cough/ gasping for air almost always goes to neb and then can't go back to sleep even 30 min later better Daytime in chest tightness resolves  completely while on prednisone last took one month prior to OV  And doesn't have it now but has exact same noct symptoms and overusing saba  Doe x mailbox and back to house out of breath and coughing  >>change dulera 200 to 100   08/30/2014 NP Follow up and Med review : Asthma Patient presents for a one-month follow-up We reviewed all her medications organize them into a medication count with patient education Patient has had  multiple medication changes recently She has been having difficulty with resistant hypertension and visual changes. Recently started on primidone and spironolactone . However, has not started this as of yet That she needs to go pick this up at the pharmacy. Neuro notes reviewed , has been referred to NS . B/p is elevated again at todays visit, discussed follow up with PCP for b/p management.  She tried to decrease her Dulera as recommended from last ov with Dr. Melvyn Novas    however, was unable to and is back on 200 dose .  Says overall her breathing is doing ok  No flare of cough or wheezing  Does have a lot of drainage in throat . Discussed starting on chlortrimeton.  rec Continue Dulera 200 2 puffs Twice daily  , rinse after use  Follow med calendar closely and bring to each visit.  Follow up with Primary MD concerning elevated blood pressure    10/05/2014 f/u ov/Audrie Kuri re: asthma with component of uacs/ no med calendar  Chief Complaint  Patient presents with  . Follow-up    Here to discuss meds- Dulera not on her drug formulary. Pt states breathing is doing well and denies any new co's today.  turns out using neb at least once every 24 h, esp between 2 and 4 am when wakes up with severe dry cough x months/ pred helps some but never eliminated noct cough rec symbicort 160 Take 2 puffs first thing in am and then another 2 puffs about 12 hours later.  Try taking the tramadol around 2 am to see if helps with the early am cough/ wheeze  See calendar for specific medication instructions and bring it back for each and every office visit  Separating the top medications from the bottom group is fundamental to providing you adequate care going forward.   Please schedule a follow up office visit in 2 weeks, sooner if needed with all medications in 2 separate bags    10/19/2014 f/u ov/Messiah Ahr re:  Asthma vs uacs/ not using med calendar correclty  Chief Complaint  Patient presents with  . Follow-up     Cough has changed sicnce starting symbicort- prod cough with "dirty" sputum. She states she feels that she can tell when her PM dose is due b/c she starts feeling SOB. She is using proair once per day on average.   not using symbicort 160 correctly  rec Prednisone 10 mg take  4 each am x 2 days,  2 each am x 2 days,  1 each am x 2 days and stop  Bystolic  10 mg take twice daily until return  Take 2 chlortrimeton at bedtime and 2 more 4 hours later  Continue symbicort 160 Take 2 puffs first thing in am and then another 2 puffs about 12 hours later.  Only use your albuterol as rescue  Only use nebulizer if you try proventil first and it fails to work    11/08/2014 Follow up : Asthma /UAC   Pt returns for follow up .  Last ov with asthma flare , tx with pred taper  She is feeling better Breathing is doing better and back to her baseline  Still has dry cough but some better.  We reviewed all her meds and organized them into a med calendar with pt education  Appears to be taking correctly although she is on over 30 medications .  B/p is improved on current regimen.  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 Follow-up    C/o increase SOB, prod cough (tan color phlem), wheezing, chest tx, nasal cong  worse x one week but at baseline doe x more than slow walk and neb q day around 4 am as the hfa not helping with noct symptoms no better even on prednisone rec Prednisone 10 mg take  4 each am x 2 days,  2 each am x 2 days,  1 each am x 2 days and stop      03/24/2015 f/u  Pulmonary office visit/ Teira Arcilla  Asthma vs vcd Chief Complaint  Patient presents with  . Follow-up    Pt c/o increased cough with tan mucus, wheeze and SOB. Pt reports increase in symptoms with weather changes. Pt has used albuterol HFA with some symptom relief. Pt denies f/n/v/d.   2 weeks prior to OV  Noted needing the noct neb again  where this problem had resolved on pred  then 2 d after that bad cough p [employer] shopping and worse since then but still comfortable p saba rx rec Prednisone 10 mg take  4 each am x 2 days,   2 each am x 2 days,  1 each am x 2 days and stop  For cough use mucinex dm up to 1200 mg every 12 hours and the flutter valve as much as possible and if still can't control the cough > tramadol as per action plan on med calendar    06/29/2015  f/u ov/Anuj Summons re: asthma and unrelated cough / no med calendar  Chief Complaint  Patient presents with  . Follow-up    Cough has not improved since she was last seen.  She is using albuterol inhaler 2 x daily and neb with albuterol 1 x daily on average.   MCT did not reproduce the cough but the sob and wheeze  Unless w/in two weeks of taking prednisone, resumes 4 am wheezing/ gasping for air> almost every night needs to back up proair with neb  On dex daily taken before supper  avg use of tramadol is 6 x in 24 h unless on pred  Prednisone 10 mg take  4 each am x 2 days,   2 each am x 2 days,  1 each am x 2 days and stop  Qvar 80 Take 2 puffs first thing in am and then another 2 puffs about 12 hours later.  change dexilant Take 30-60 min before first meal of the day and Add Pepcid 20 mg  at bedtime  For drainage / throat tickle try take CHLORPHENIRAMINE  4 mg - take one every 4 hours as needed - available over the counter- may cause drowsiness so start with just a bedtime dose or two and see how you tolerate it before trying in daytime   See Tammy NP w/in 2 weeks(or next available after than)  with all your medications,  At f/u (given one sample = 120 pffs)     07/18/2015 NP  Follow up : Asthma vs Cough  Pt returns for 2 week follow up . Seen last ov with Asthma flare , tx w/ steroid taper.  She is feeling better. Still has some lingering cough on/off.  QVAR was added to her symbicort .  We reviewed all her meds and organized them into a med calendar with pt  education.  Appears to be taking correctly rec Follow med calendar    08/31/2015 acute extended ov/Rylee Nuzum re: asthma flare on maint rx with symb 160/qvar 80 2bid Chief Complaint  Patient presents with  . Acute Visit    Pt c/o wheezing, increased SOB, chest tightness and cough with white sputum x 2 days.   baseline still needed albuterol with activity regardless of time of day  Before was using neb nightly but that improved p last ov then p  cleaning out storage shed >  Tongue felt full /sob wheezing > 2 ER Trips 08/19/15  > benadryl/ pepcid no pred >  08/29/15 returned to ER > mgs02/ prednisone at 40 mg per day/ only used saba hfa today / cough less of a problem with this flare  rec Prednisone 40 mg x 4 days and 20 mg x 4 days and 10 mg daily x 4 days and off Add pepcid 20 mg one at bedtime  For cough > mucinex dm up to 1200 mg every 12 hours, supplement with tramadol and use the flutter valve as much as possible    11/17/2015  f/u ov/Analisia Kingsford re: 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 Follow-up    pt c/o prod cough with beige mucus, sob with exertion.     just using albuterol about half way thru each day for sob  Still wakes every morning around 4 am coughing unless actively taking prednisone and helps x 2 weeks then gradually worse again. 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 Follow-up    4wk rov  doing  better though still wakes up nightly with cough / denies using tramadol or albuterol much at all now  rec For drainage / throat tickle>  try take CHLORPHENIRAMINE  4 mg - take one every 4 hours as needed - - always take 2 at bedtime     Care everywhere notes reviewed from  neurosurgery from [facility] , recent right frontal VP shunt  rec at d/c 01/25/16 d/c reglan/ hycodan for cough on med sheet   02/08/16 Follow up : Cough variant asthma  Pt returns for follow up . Says her cough has picked back up last few weeks.  Was doing well and controlled on tramadol most days.  She ran out of tramadol few weeks prior to OV   Seen by PCP given hydromet which helps as well.  Would like refill of tramadol . We discussed chronic narcotic use w/ pt education.  Discussed additonal medication to help with chronic cough control.  rec May use Delsym 2 tsp Twice daily  As needed cough .  Add Tramadol 50mg  1 every 6hr as needed for cough - try to limit use as able .  Sips of water to soothe cough/throat.        03/22/2016 acute extended ov/Abdulai Blaylock re: recurrent cough / no med calendar Chief Complaint  Patient presents with  . Acute Visit    Increased coughing, tramadol is not working.    worse x one month to point of gag/vomit no better with tramadol/ head hurts from coughing fits 24/7 s sob  No obvious day to day or daytime variability or assoc excess/ purulent sputum or mucus plugs or hemoptysis or cp or chest tightness, subjective wheeze or overt sinus or hb symptoms. No unusual exp hx or h/o childhood pna/ asthma or knowledge of premature birth.  Sleeping ok without nocturnal  or early am exacerbation  of respiratory  c/o's or need for noct saba. Also denies any obvious fluctuation of symptoms with weather or environmental changes or other aggravating or alleviating factors except as outlined above   Current Medications, Allergies, Complete Past Medical History, Past Surgical History, Family History, and  Social History were reviewed in [employer] record.  ROS  The following are not active complaints unless bolded sore throat, dysphagia, dental problems, itching, sneezing,  nasal congestion or excess/ purulent secretions, ear ache,   fever, chills, sweats, unintended wt loss, classically pleuritic or exertional cp,  orthopnea pnd or leg swelling, presyncope, palpitations, abdominal pain, anorexia, nausea, vomiting, diarrhea  or change in bowel or bladder habits, change in stools or urine, dysuria,hematuria,  rash, arthralgias, visual complaints, headache, numbness, weakness or ataxia or problems with walking or coordination,  change in mood/affect or memory.                Objective:   Physical Exam  Obese bf nad  vital signs reviewed  - Note on arrival 02 sats  100% on RA     Wt 213  12/18/10 > 01/03/2011  214 > 01/31/2011  206 >  09/11/2012 209 >208 6/19 > 210 09/25/2012 > 11/20/2012 211 >208 12/07/12> 01/12/2013 209 > 02/09/2013 210 >207 04/06/2013 > 07/14/2013  213>212 07/28/2013 >204 01/26/2014> 04/04/2014 206  >205 04/18/2014 >  07/18/2014 201 >  08/01/14 201 >198 08/30/2014 > 10/05/14  195 > 10/19/2014 192 > 191 11/08/2014 > 02/10/2015  198 > 03/24/2015   198 > 06/29/2015   201 > 08/31/2015  204 > 11/17/2015  197 > 12/15/2015  188 > 03/22/2016   188    HEENT: nl dentition, turbinates, and orophanx. Nl external ear canals without cough reflex,     NECK :  without JVD/Nodes/TM/ nl carotid upstrokes bilaterally   LUNGS: lungs clear bilaterally to A and P    CV:  RRR  no s3 or murmur or increase  in P2, no edema   ABD:  soft and nontender with nl excursion in the supine position. No bruits or organomegaly, bowel sounds nl  MS:  warm without deformities, calf tenderness, cyanosis or clubbing          CXR:  08/29/15 No evidence of acute cardiopulmonary disease.

## 2016-03-25 NOTE — Assessment & Plan Note (Signed)
MRI 05/18/15 > Paranasal sinuses are clear - 11/17/2015 rec increase h1 to 2 hs to help with noct cough  - Allergy profile 11/17/2015 >  Eos 0.1 /  IgE  80 POS RAST  to grass, ragweed, cedar tree   Lack of cough resolution on a verified empirical regimen could mean an alternative diagnosis, persistence of the disease state (eg sinusitis or bronchiectasis) , or inadequacy of currently available therapy (eg no medical rx available for non-acid gerd - clearly the latter is happening as she coughs to point of vomiting)  Of the three most common causes of chronic cough, only one (GERD)  can actually cause the other two (asthma and post nasal drip syndrome)  and perpetuate the cylce of cough inducing airway trauma, inflammation, heightened sensitivity to reflux which is prompted by the cough itself via a cyclical mechanism.    This may partially respond to steroids and look like asthma and post nasal drainage but never erradicated completely unless the cough and the secondary reflux are eliminated, preferably both at the same time.  While not intuitively obvious, many patients with chronic low grade reflux do not cough until there is a secondary insult that disturbs the protective epithelial barrier and exposes sensitive nerve endings.  This can be viral or direct physical injury such as with an endotracheal tube.   The point is that once this occurs, it is difficult to eliminate using anything but a maximally effective acid suppression regimen at least in the short run, accompanied by an appropriate diet to address non acid GERD.   For now max rx for gerd / eliminate cyclical coughing with flutter valve and tylenol #4   I had an extended discussion with the patient reviewing all relevant studies completed to date and  lasting 15 to 20 minutes of a 25 minute visit on the following ongoing concerns:   ???  maintaining med reconciliation and have advised pt that if she can't keep up with the med calendar we  provided an make sure it's an accurate reflection of how she takes her meds then we won't be able to continue to provide her narcotics and will will be asking her to see another provider.    Each maintenance medication was reviewed in detail including most importantly the difference between maintenance and as needed and under what circumstances the prns are to be used. This was done in the context of a medication calendar review which provided the patient with a user-friendly unambiguous mechanism for medication administration and reconciliation and provides an action plan for all active problems. It is critical that this be shown to every doctor  for modification during the office visit if necessary so the patient can use it as a working document.

## 2016-03-25 NOTE — Assessment & Plan Note (Signed)
Complicated by reflux/ hpb/ restrictive pfts  Body mass index is 30.41  - no change  Lab Results  Component Value Date   TSH 1.06 06/15/2015     Contributing to gerd tendency/ doe/reviewed the need and the process to achieve and maintain neg calorie balance > defer f/u primary care including intermittently monitoring thyroid status

## 2016-03-25 NOTE — Assessment & Plan Note (Addendum)
-   PFT's 01/31/2011 FEV1  1.76 (60% ) ratio 68 and no better p B2,  DLCO 70% -med review 09/25/2012 > Dulera drug assistance  paperwork given    - HFA 90% 11/20/2012 .  -Med calendar 12/07/12 , 04/06/2013 , 04/18/2014,, 08/30/2014 , 11/08/2014  - PFT's 01/12/2013  1.73 (65%)  Ratio 73 and no better p B2, DLCO 86% - trial off singulair 02/08/13 >>>did not stop  - 04/05/2014  trial of dulera 100 instead of 200 > worse  -04/18/2014 increase Dulera 200  - 08/01/2014 p extensive coaching HFA effectiveness =    90% - 08/01/2014  Walked RA x 2laps @ 185 ft each stopped due to cough > sob/ no desat moderate pace  - spirometry 08/01/2014  FEV1  1.87 (70%) ratio 78  fef 25-75 53%  - spirometry 02/10/2015  flattening of top portion of f/v loop only    - 02/10/2015   Walked RA  2 laps @ 185 ft each stopped due to cough > sob/ slow pace   -  NO 03/24/2015   =  16 - Methacholine challenge 04/04/2015   POS asthma = wheeze/sob but no coughing fits > added qvar 80 2bid 06/29/2015 >>>   -flare sarting Aug 19 2015 p exp to storage shed  - 08/31/2015  After extensive coaching HFA effectiveness =    75% (Ti too short)  - Spirometry 08/31/2015  FEV1 0.75 (28%)  Ratio 68  - FENO 11/17/2015  =  8  on symb 160 2bid and qvar 80 2bid  - FENO 03/22/2016  = 36 on symb 160/qvar 80 though not able to verify being used as directed    I really don't believe there is much active asthma here though very difficult to tease out from UACS >no need to change rx at this point/ cautioned re excees use of saba for doe which most of the time is more likely to be due to obesity/decontioning

## 2016-04-04 ENCOUNTER — Encounter: Payer: Self-pay | Admitting: *Deleted

## 2016-04-04 ENCOUNTER — Ambulatory Visit (INDEPENDENT_AMBULATORY_CARE_PROVIDER_SITE_OTHER): Payer: Medicare Other | Admitting: Cardiology

## 2016-04-04 VITALS — BP 206/102 | HR 122 | Ht 66.0 in | Wt 192.0 lb

## 2016-04-04 DIAGNOSIS — I160 Hypertensive urgency: Secondary | ICD-10-CM | POA: Diagnosis not present

## 2016-04-04 DIAGNOSIS — I10 Essential (primary) hypertension: Secondary | ICD-10-CM

## 2016-04-04 DIAGNOSIS — R053 Chronic cough: Secondary | ICD-10-CM

## 2016-04-04 DIAGNOSIS — I5032 Chronic diastolic (congestive) heart failure: Secondary | ICD-10-CM

## 2016-04-04 DIAGNOSIS — I119 Hypertensive heart disease without heart failure: Secondary | ICD-10-CM | POA: Diagnosis not present

## 2016-04-04 DIAGNOSIS — R05 Cough: Secondary | ICD-10-CM

## 2016-04-04 DIAGNOSIS — R000 Tachycardia, unspecified: Secondary | ICD-10-CM

## 2016-04-04 MED ORDER — SPIRONOLACTONE 50 MG PO TABS: 50.0000 mg | ORAL_TABLET | Freq: Every day | ORAL | 2 refills | Status: DC

## 2016-04-04 MED ORDER — POTASSIUM CHLORIDE CRYS ER 20 MEQ PO TBCR: 40.0000 meq | EXTENDED_RELEASE_TABLET | Freq: Every day | ORAL | 2 refills | Status: DC

## 2016-04-04 MED ORDER — CHLORTHALIDONE 25 MG PO TABS: 25.0000 mg | ORAL_TABLET | Freq: Every day | ORAL | 2 refills | Status: DC

## 2016-04-04 MED ORDER — VALSARTAN 160 MG PO TABS: 160.0000 mg | ORAL_TABLET | Freq: Every day | ORAL | 2 refills | Status: DC

## 2016-04-04 NOTE — Progress Notes (Signed)
Cardiology Office Note    Date:  04/04/2016   ID:  Erica [name], DOB 07-15-1975, MRN [id number]  PCP:  Scarlette Calico, MD  Cardiologist: Dr Meda Coffee  Chief complain: uncontrolled HTN, headaches  History of Present Illness:  Erica [name] is a 41 y.o. female with history of asthma, obesity and hypertension, diastolic CHF, HLD,OSA, and breast cancer in 2010 treated with neoadjuvant chemo with docetaxel and cyclophosphamide, and paclitaxel. She also has history of recurrent syncope. He cardio monitor showed only sinus rhythm. Was felt may be due to narcolepsy. Sleep stay showed severe sleep apnea but she is noncompliant with CPap machine.  Patient has history of pseudotumor cerebri And has idiopathic intracranial hypertension. She is having surgery at [facility] on Thursday for insertion of a ventricular peritoneal shunt.  She saw Dr. Meda Coffee 01/05/16 with complaints of sharp chest pain and acute bronchitis. She was started on Z-Pak as well as Mucinex breast or cough. She is here today to make sure there is to see if there is improvement and if not consider possible pericarditis.   04/04/2016 - this is a 3 months follow-up, patient has been going through acute asthma attack and was seen by pulmonary who started her on oral steroids 2 weeks ago as well as increased dose of albuterol, she is also using Qvar and Advair. She continues to have cough productive of yellowish brownish sputum in the last 2 years with some improvement with oral steroids no significant improvement after discontinuing lisinopril. She has been recently taking antibiotics. Today is her last day of prednisone. Since she has been on more intense asthma medication her blood pressure has been uncontrolled.  She underwent VP shunt placement for intracranial hypertension at [facility] in October 2017 with residual significant headache since then. Her headache is worse when she coughs. She denies any lower extremity edema orthopnea or paroxysmal  nocturnal dyspnea and no chest pain.  Past Medical History:  Diagnosis Date  . Anemia, unspecified 04/13/2011  . Asthma   . Asthma 09/27/2010  . Bloating 03/18/2011  . Breast cancer ([place]) 2010   a. locally advanced left breast carcinoma diagnosed in 2010 and treated with neoadjuvant chemotherapy with docetaxel and cyclophosphamide as well as paclitaxel. This was followed by radiation therapy which was completed in 2011.  Marland Kitchen Dyspnea on exertion    a. 01/2013 Lexi MV: EF 54%, no ischemia/infarct;  b. 05/2013 Echo: EF 60-65%, no rwma, Gr 2 DD;  b.   . Fatty liver   . Gastroparesis   . GERD (gastroesophageal reflux disease)   . Hypertension   . Impaired glucose tolerance 04/13/2011  . Lymphadenitis, chronic    restricted LEFT extremity  . 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 OSA (obstructive sleep apnea) 05/04/2014   AHI 31/hr now on CPAP at 12cm H2O    Past Surgical History:  Procedure Laterality Date  . BRAIN SURGERY     Shunt  . BREAST LUMPECTOMY Left 09/2008  . CARPAL TUNNEL RELEASE Left 2011  . LYMPH NODE DISSECTION Left 2010   breast; 2 wks after breast lumpectomy  . TENDON RELEASE Right 2012   "hand"  . TUBAL LIGATION  05/11/2011   Procedure: POST PARTUM TUBAL LIGATION;  Surgeon: Emeterio Reeve, MD;  Location: [facility];  Service: Gynecology;  Laterality: Bilateral;  Induced for HTN    Current Medications: Outpatient Medications Prior to Visit  Medication Sig Dispense Refill  . acetaminophen-codeine (TYLENOL #4) 300-60 MG tablet Take 1 tablet by  mouth every 4 (four) hours as needed for moderate pain (or cough). 30 tablet 0  . acetaZOLAMIDE (DIAMOX) 250 MG tablet Take 4 tablets (1,000 mg total) by mouth 3 (three) times daily. 400 tablet 11  . albuterol (PROVENTIL) (2.5 MG/3ML) 0.083% nebulizer solution USE 1 VIAL VIA NEBULIZER EVERY 4 HOURS AS NEEDED( NEED APPOINTMENT) 300 mL 2  . beclomethasone (QVAR) 80 MCG/ACT inhaler Inhale 2 puffs into the lungs 2 (two) times daily. 1 Inhaler  5  . cloNIDine (CATAPRES) 0.3 MG tablet TAKE 1 TABLET BY MOUTH TWICE DAILY 180 tablet 3  . dexlansoprazole (DEXILANT) 60 MG capsule Take 1 capsule (60 mg total) by mouth daily. 30 capsule 5  . dicyclomine (BENTYL) 20 MG tablet Take 1 tablet (20 mg total) by mouth 3 (three) times daily as needed for spasms. 90 tablet 2  . diltiazem (CARDIZEM LA) 420 MG 24 hr tablet Take 1 tablet (420 mg total) by mouth daily. 90 tablet 3  . DULoxetine (CYMBALTA) 60 MG capsule Take 1 capsule (60 mg total) by mouth daily. 90 capsule 3  . EPINEPHrine 0.3 mg/0.3 mL IJ SOAJ injection Inject 0.3 mLs (0.3 mg total) into the muscle once as needed (anaphylaxis reaction). 2 Device 5  . Eszopiclone 3 MG TABS Take 1 tablet (3 mg total) by mouth at bedtime as needed. 30 tablet 3  . Fluocinonide Emulsified Base 0.05 % CREA APPLY EXTERNALLY TO THE AFFECTED AREA TWICE DAILY 60 g 1  . hydrALAZINE (APRESOLINE) 100 MG tablet TAKE 1 TABLET(100 MG) BY MOUTH THREE TIMES DAILY 90 tablet 0  . lidocaine (XYLOCAINE) 5 % ointment Apply 1 application topically 2 (two) times daily as needed. 50 g 5  . metoCLOPramide (REGLAN) 10 MG tablet TAKE 1 TABLET BY MOUTH FOUR TIMES DAILY AT BEDTIME WITH MEALS 120 tablet 5  . mometasone (NASONEX) 50 MCG/ACT nasal spray Place 2 sprays into the nose 2 (two) times daily. 17 g 5  . montelukast (SINGULAIR) 10 MG tablet TAKE 1 TABLET BY MOUTH EVERY NIGHT AT BEDTIME 90 tablet 1  . Nebivolol HCl (BYSTOLIC) 20 MG TABS 2 tabs by mouth per day 180 tablet 3  . NITROSTAT 0.4 MG SL tablet PLACE 1 TABLET UNDER TONGUE EVERY 5 MINUTES AS NEEDED FOR CHEST PAIN, MAX OF 3 TABLETS THEN CALL 911 IF PAIN PERSISTS 100 tablet 0  . OXYGEN Use CPAP at bedtime    . predniSONE (DELTASONE) 10 MG tablet Take  4 each am x 2 days,   2 each am x 2 days,  1 each am x 2 days and stop 14 tablet 0  . primidone (MYSOLINE) 50 MG tablet Take 50 mg by mouth 2 (two) times daily.     . promethazine (PHENERGAN) 12.5 MG tablet Take 1-2 tablets by  mouth every 8 hours as needed. Beware of sedation 50 tablet 2  . ranolazine (RANEXA) 500 MG 12 hr tablet Take 1 tablet (500 mg total) by mouth 2 (two) times daily. 180 tablet 1  . Respiratory Therapy Supplies (FLUTTER) DEVI Use as directed 1 each 0  . SYMBICORT 160-4.5 MCG/ACT inhaler INHALE 2 PUFFS BY MOUTH TWICE DAILY 10.2 g 0  . topiramate (TOPAMAX) 25 MG tablet Take 25 mg by mouth 2 (two) times daily.    . traMADol (ULTRAM) 50 MG tablet Take 1 tablet (50 mg total) by mouth every 6 (six) hours as needed. 60 tablet 5  . traZODone (DESYREL) 150 MG tablet Take 50 mg by mouth at bedtime.  1  . VENTOLIN HFA 108 (90 Base) MCG/ACT inhaler INHALE 2 PUFFS INTO THE LUNGS EVERY 4 HOURS AS NEEDED FOR WHEEZING OR SHORTNESS OF BREATH 18 g 5  . hydrochlorothiazide (MICROZIDE) 12.5 MG capsule TAKE 1 CAPSULE(12.5 MG) BY MOUTH DAILY 90 capsule 1  . potassium chloride SA (K-DUR,KLOR-CON) 20 MEQ tablet Take 1 tablet (20 mEq total) by mouth 3 (three) times daily. 90 tablet 5  . spironolactone (ALDACTONE) 25 MG tablet Take 1 tablet (25 mg total) by mouth daily. 90 tablet 3   No facility-administered medications prior to visit.      Allergies:   Aspirin; Bactrim [sulfamethoxazole-trimethoprim]; Doxycycline; Penicillins; Kiwi extract; and Strawberry extract   Social History   Social History  . Marital status: Single    Spouse name: N/A  . Number of children: 3  . Years of education: N/A   Occupational History  .  Unemployed   Social History Main Topics  . Smoking status: Never Smoker  . Smokeless tobacco: Never Used  . Alcohol use 0.0 oz/week     Comment: occasionally/socially  . Drug use: No  . Sexual activity: Yes    Birth control/ protection: Surgical   Other Topics Concern  . Not on file   Social History Narrative   She lives with four children.   She is currently not working (disbaility pending).           Family History:  The patient's family history includes Breast cancer in her  other and other; Diabetes in her maternal grandmother; Fibroids (age of onset: 24) in her sister; Heart disease in her maternal grandmother; Hypertension in her maternal grandmother; Other (age of onset: 63) in her mother.   ROS:   Please see the history of present illness.    Review of Systems  Constitution: Positive for malaise/fatigue.  Eyes: Negative.   Cardiovascular: Positive for chest pain and dyspnea on exertion.  Respiratory: Positive for cough and shortness of breath.   Hematologic/Lymphatic: Negative.   Musculoskeletal: Negative.  Negative for joint pain.  Gastrointestinal: Negative.   Genitourinary: Negative.   Neurological: Positive for headaches.   All other systems reviewed and are negative.   PHYSICAL EXAM:   VS:  BP (!) 206/102   Pulse (!) 122   Ht 5\' 6"  (1.676 m)   Wt 192 lb (87.1 kg)   SpO2 98%   BMI 30.99 kg/m   Physical Exam  GEN: Well nourished, well developed, in no acute distress  Neck: no JVD, carotid bruits, or masses Cardiac:RRR;distant heart sounds, no murmurs, rubs, or gallops  Respiratory:  Decreased breath sounds with scattered rhonchi and wheezing  GI: soft, nontender, nondistended, + BS Ext: without cyanosis, clubbing, or edema, Good distal pulses bilaterally MS: no deformity or atrophy  Skin: warm and dry, no rash Psych: euthymic mood, full affect  Wt Readings from Last 3 Encounters:  04/04/16 192 lb (87.1 kg)  03/22/16 188 lb 6.4 oz (85.5 kg)  02/13/16 189 lb (85.7 kg)      Studies/Labs Reviewed:   EKG:  EKG is not ordered today.   Recent Labs: 06/15/2015: TSH 1.06 02/13/2016: Magnesium 2.1 03/14/2016: ALT 26; BUN 12.9; Creatinine 1.1; HGB 14.1; Platelets 210; Potassium 3.6; Sodium 141   Lipid Panel    Component Value Date/Time   CHOL 206 (H) 06/15/2015 1645   TRIG 110.0 06/15/2015 1645   HDL 44.10 06/15/2015 1645   CHOLHDL 5 06/15/2015 1645   VLDL 22.0 06/15/2015 1645   LDLCALC 140 (H)  06/15/2015 1645    Additional  studies/ records that were reviewed today include:   Nuclear stress test: 02/15/2013 Impression  Exercise Capacity: Lexiscan with no exercise.  BP Response: Hypertensive blood pressure response.  Clinical Symptoms: Chest pain, headache.  ECG Impression: No significant ST segment change suggestive of ischemia.  Comparison with Prior Nuclear Study: No images to compare  Overall Impression: Normal stress nuclear study.  LV Ejection Fraction: 54%. LV Wall Motion: NL LV Function; NL Wall Motion  Loralie Champagne  02/12/2013     TTE 06/17/2013   Left ventricle: The cavity size was normal. There was mild concentric hypertrophy. Systolic function was normal. The estimated ejection fraction was in the range of 60% to 65%. Wall motion was normal; there were no regional wall motion abnormalities. Features are consistent with a pseudonormal left ventricular filling pattern, with concomitant abnormal relaxation and increased filling pressure (grade 2 diastolic dysfunction). Doppler parameters are consistent with high ventricular filling pressure. Impressions: - When compared to 2010, EF has improved.  Accessory Clinical Findings      ASSESSMENT:    1. Essential hypertension   2. Hypertensive heart disease without heart failure   3. Chronic diastolic CHF (congestive heart failure) ([place])   4. Hypertensive urgency   5. Sinus tachycardia   6. Chronic cough      PLAN:  In order of problems listed above:  1. Patient has severely elevated blood pressure and heart rate, this is most probably a consequence all increase use of alpha medics for asthma and use of prednisone. We will change her medicines as follows   Continue Nebivolol 20 mg twice a day, hydralazine 100 mg 3 times a day, avoid Imdur as that gave   her worsening headaches in the past, continue diltiazem 420 mg by mouth daily  Discontinue hydrochlorothiazide  Start valsartan 160 mg by mouth daily  Increase spironolactone to 50  mg by mouth daily  Start chlorthalidone 25 mg daily  Decrease potassium chloride from 60-40 mg daily.  Check BMP in one week follow-up with our clinical pharmacist Megan Supple in 2 weeks.  2. Chronic diastolic CHF compensated - appears euvolemic  Medication Adjustments/Labs and Tests Ordered: Current medicines are reviewed at length with the patient today.  Concerns regarding medicines are outlined above.  Medication changes, Labs and Tests ordered today are listed in the Patient Instructions below. Patient Instructions  Medication Instructions:   STOP TAKING HYDROCHLOROTHIAZIDE NOW  START TAKING VALSARTAN 160 MG ONCE DAILY  START TAKING CHLORTHALIDONE 25 MG ONCE DAILY  DECREASE YOUR POTASSIUM CHLORIDE TO 40 mEq (take 2 tablets of the 20 mEq pills)  ONCE DAILY  INCREASE YOUR SPIRONOLACTONE TO 50 MG ONCE DAILY   Labwork:  IN ONE WEEK TO CHECK A ---BMET     Follow-Up:  WITH MEGAN SUPPLE PHARM[facility]D [facility] IN 2 WEEKS       If you need a refill on your cardiac medications before your next appointment, please call your pharmacy.      Signed, Ena Dawley, MD  04/04/2016 2:26 PM    [address], [place], [place]  [address] Phone: [phone]; Fax: [phone]

## 2016-04-04 NOTE — Patient Instructions (Signed)
Medication Instructions:   STOP TAKING HYDROCHLOROTHIAZIDE NOW  START TAKING VALSARTAN 160 MG ONCE DAILY  START TAKING CHLORTHALIDONE 25 MG ONCE DAILY  DECREASE YOUR POTASSIUM CHLORIDE TO 40 mEq (take 2 tablets of the 20 mEq pills)  ONCE DAILY  INCREASE YOUR SPIRONOLACTONE TO 50 MG ONCE DAILY   Labwork:  IN ONE WEEK TO CHECK A ---BMET     Follow-Up:  WITH MEGAN SUPPLE PHARM-D [facility] IN 2 WEEKS       If you need a refill on your cardiac medications before your next appointment, please call your pharmacy.

## 2016-04-05 ENCOUNTER — Other Ambulatory Visit: Payer: Self-pay | Admitting: Internal Medicine

## 2016-04-05 DIAGNOSIS — R05 Cough: Secondary | ICD-10-CM

## 2016-04-05 DIAGNOSIS — R058 Other specified cough: Secondary | ICD-10-CM

## 2016-04-05 MED ORDER — BECLOMETHASONE DIPROPIONATE 80 MCG/ACT IN AERS: 2.0000 | INHALATION_SPRAY | Freq: Two times a day (BID) | RESPIRATORY_TRACT | 5 refills | Status: DC

## 2016-04-05 MED ORDER — TRAMADOL HCL 50 MG PO TABS: 50.0000 mg | ORAL_TABLET | Freq: Four times a day (QID) | ORAL | 1 refills | Status: DC | PRN

## 2016-04-05 MED ORDER — MONTELUKAST SODIUM 10 MG PO TABS: 10.0000 mg | ORAL_TABLET | Freq: Every day | ORAL | 5 refills | Status: DC

## 2016-04-05 MED ORDER — BUDESONIDE-FORMOTEROL FUMARATE 160-4.5 MCG/ACT IN AERO: 2.0000 | INHALATION_SPRAY | Freq: Two times a day (BID) | RESPIRATORY_TRACT | 5 refills | Status: DC

## 2016-04-08 ENCOUNTER — Ambulatory Visit: Payer: Medicare Other | Admitting: Cardiology

## 2016-04-10 ENCOUNTER — Other Ambulatory Visit: Payer: Self-pay | Admitting: *Deleted

## 2016-04-10 DIAGNOSIS — C50412 Malignant neoplasm of upper-outer quadrant of left female breast: Secondary | ICD-10-CM

## 2016-04-11 ENCOUNTER — Ambulatory Visit (HOSPITAL_BASED_OUTPATIENT_CLINIC_OR_DEPARTMENT_OTHER): Payer: Medicare Other

## 2016-04-11 ENCOUNTER — Other Ambulatory Visit (HOSPITAL_BASED_OUTPATIENT_CLINIC_OR_DEPARTMENT_OTHER): Payer: Medicare Other

## 2016-04-11 ENCOUNTER — Other Ambulatory Visit: Payer: Medicare Other | Admitting: *Deleted

## 2016-04-11 VITALS — BP 170/90 | HR 92 | Temp 98.0°F | Resp 20

## 2016-04-11 DIAGNOSIS — I119 Hypertensive heart disease without heart failure: Secondary | ICD-10-CM | POA: Diagnosis not present

## 2016-04-11 DIAGNOSIS — I10 Essential (primary) hypertension: Secondary | ICD-10-CM | POA: Diagnosis not present

## 2016-04-11 DIAGNOSIS — C50412 Malignant neoplasm of upper-outer quadrant of left female breast: Secondary | ICD-10-CM

## 2016-04-11 DIAGNOSIS — Z5111 Encounter for antineoplastic chemotherapy: Secondary | ICD-10-CM | POA: Diagnosis present

## 2016-04-11 LAB — CBC WITH DIFFERENTIAL/PLATELET
BASO%: 0.4 % (ref 0.0–2.0)
Basophils Absolute: 0 10*3/uL (ref 0.0–0.1)
EOS%: 1.4 % (ref 0.0–7.0)
Eosinophils Absolute: 0.1 10*3/uL (ref 0.0–0.5)
HCT: 41.3 % (ref 34.8–46.6)
HGB: 14.1 g/dL (ref 11.6–15.9)
LYMPH%: 34.9 % (ref 14.0–49.7)
MCH: 30.2 pg (ref 25.1–34.0)
MCHC: 34.1 g/dL (ref 31.5–36.0)
MCV: 88.4 fL (ref 79.5–101.0)
MONO#: 0.4 10*3/uL (ref 0.1–0.9)
MONO%: 6 % (ref 0.0–14.0)
NEUT%: 57.3 % (ref 38.4–76.8)
NEUTROS ABS: 4.1 10*3/uL (ref 1.5–6.5)
Platelets: 246 10*3/uL (ref 145–400)
RBC: 4.67 10*6/uL (ref 3.70–5.45)
RDW: 13.5 % (ref 11.2–14.5)
WBC: 7.2 10*3/uL (ref 3.9–10.3)
lymph#: 2.5 10*3/uL (ref 0.9–3.3)

## 2016-04-11 MED ORDER — GOSERELIN ACETATE 3.6 MG ~~LOC~~ IMPL
3.6000 mg | DRUG_IMPLANT | Freq: Once | SUBCUTANEOUS | Status: AC
  Administered 2016-04-11: 3.6 mg via SUBCUTANEOUS
  Filled 2016-04-11: qty 3.6

## 2016-04-11 NOTE — Patient Instructions (Signed)
Goserelin injection What is this medicine? GOSERELIN (GOE se rel in) is similar to a hormone found in the body. It lowers the amount of sex hormones that the body makes. Men will have lower testosterone levels and women will have lower estrogen levels while taking this medicine. In men, this medicine is used to treat prostate cancer; the injection is either given once per month or once every 12 weeks. A once per month injection (only) is used to treat women with endometriosis, dysfunctional uterine bleeding, or advanced breast cancer. This medicine may be used for other purposes; ask your health care provider or pharmacist if you have questions. What should I tell my health care provider before I take this medicine? They need to know if you have any of these conditions (some only apply to women): -diabetes -heart disease or previous heart attack -high blood pressure -high cholesterol -kidney disease -osteoporosis or low bone density -problems passing urine -spinal cord injury -stroke -tobacco smoker -an unusual or allergic reaction to goserelin, hormone therapy, other medicines, foods, dyes, or preservatives -pregnant or trying to get pregnant -breast-feeding How should I use this medicine? This medicine is for injection under the skin. It is given by a health care professional in a hospital or clinic setting. Men receive this injection once every 4 weeks or once every 12 weeks. Women will only receive the once every 4 weeks injection. Talk to your pediatrician regarding the use of this medicine in children. Special care may be needed. Overdosage: If you think you have taken too much of this medicine contact a poison control center or emergency room at once. NOTE: This medicine is only for you. Do not share this medicine with others. What if I miss a dose? It is important not to miss your dose. Call your doctor or health care professional if you are unable to keep an appointment. What may  interact with this medicine? -female hormones like estrogen -herbal or dietary supplements like black cohosh, chasteberry, or DHEA -female hormones like testosterone -prasterone This list may not describe all possible interactions. Give your health care provider a list of all the medicines, herbs, non-prescription drugs, or dietary supplements you use. Also tell them if you smoke, drink alcohol, or use illegal drugs. Some items may interact with your medicine. What should I watch for while using this medicine? Visit your doctor or health care professional for regular checks on your progress. Your symptoms may appear to get worse during the first weeks of this therapy. Tell your doctor or healthcare professional if your symptoms do not start to get better or if they get worse after this time. Your bones may get weaker if you take this medicine for a long time. If you smoke or frequently drink alcohol you may increase your risk of bone loss. A family history of osteoporosis, chronic use of drugs for seizures (convulsions), or corticosteroids can also increase your risk of bone loss. Talk to your doctor about how to keep your bones strong. This medicine should stop regular monthly menstration in women. Tell your doctor if you continue to menstrate. Women should not become pregnant while taking this medicine or for 12 weeks after stopping this medicine. Women should inform their doctor if they wish to become pregnant or think they might be pregnant. There is a potential for serious side effects to an unborn child. Talk to your health care professional or pharmacist for more information. Do not breast-feed an infant while taking this medicine. Men should   inform their doctors if they wish to father a child. This medicine may lower sperm counts. Talk to your health care professional or pharmacist for more information. What side effects may I notice from receiving this medicine? Side effects that you should  report to your doctor or health care professional as soon as possible: -allergic reactions like skin rash, itching or hives, swelling of the face, lips, or tongue -bone pain -breathing problems -changes in vision -chest pain -feeling faint or lightheaded, falls -fever, chills -pain, swelling, warmth in the leg -pain, tingling, numbness in the hands or feet -signs and symptoms of low blood pressure like dizziness; feeling faint or lightheaded, falls; unusually weak or tired -stomach pain -swelling of the ankles, feet, hands -trouble passing urine or change in the amount of urine -unusually high or low blood pressure -unusually weak or tired Side effects that usually do not require medical attention (report to your doctor or health care professional if they continue or are bothersome): -change in sex drive or performance -changes in breast size in both males and females -changes in emotions or moods -headache -hot flashes -irritation at site where injected -loss of appetite -skin problems like acne, dry skin -vaginal dryness This list may not describe all possible side effects. Call your doctor for medical advice about side effects. You may report side effects to FDA at 1-800-FDA-1088. Where should I keep my medicine? This drug is given in a hospital or clinic and will not be stored at home. NOTE: This sheet is a summary. It may not cover all possible information. If you have questions about this medicine, talk to your doctor, pharmacist, or health care provider.    2016, Elsevier/Gold Standard. (2013-05-25 11:10:35)  

## 2016-04-12 LAB — BASIC METABOLIC PANEL
BUN/Creatinine Ratio: 12 (ref 9–23)
BUN: 10 mg/dL (ref 6–24)
CO2: 25 mmol/L (ref 18–29)
Calcium: 9.1 mg/dL (ref 8.7–10.2)
Chloride: 99 mmol/L (ref 96–106)
Creatinine, Ser: 0.82 mg/dL (ref 0.57–1.00)
GFR calc Af Amer: 102 mL/min/{1.73_m2} (ref >59)
GFR calc non Af Amer: 89 mL/min/{1.73_m2} (ref >59)
Glucose: 99 mg/dL (ref 65–99)
Potassium: 3.7 mmol/L (ref 3.5–5.2)
Sodium: 143 mmol/L (ref 134–144)

## 2016-04-18 ENCOUNTER — Ambulatory Visit: Payer: Medicare Other

## 2016-04-19 ENCOUNTER — Ambulatory Visit (INDEPENDENT_AMBULATORY_CARE_PROVIDER_SITE_OTHER): Payer: Medicare Other | Admitting: Adult Health

## 2016-04-19 ENCOUNTER — Encounter: Payer: Self-pay | Admitting: Adult Health

## 2016-04-19 DIAGNOSIS — I10 Essential (primary) hypertension: Secondary | ICD-10-CM | POA: Diagnosis not present

## 2016-04-19 DIAGNOSIS — R058 Other specified cough: Secondary | ICD-10-CM

## 2016-04-19 DIAGNOSIS — R05 Cough: Secondary | ICD-10-CM | POA: Diagnosis not present

## 2016-04-19 DIAGNOSIS — J45991 Cough variant asthma: Secondary | ICD-10-CM

## 2016-04-19 MED ORDER — ACETAMINOPHEN-CODEINE #4 300-60 MG PO TABS: 1.0000 | ORAL_TABLET | ORAL | 0 refills | Status: DC | PRN

## 2016-04-19 NOTE — Addendum Note (Signed)
Addended by: Melvenia Needles on: 04/19/2016 12:33 PM   Modules accepted: Orders

## 2016-04-19 NOTE — Assessment & Plan Note (Signed)
Follow up with PCP for elevated b/p .

## 2016-04-19 NOTE — Progress Notes (Signed)
@Patient  ID: Erica [name], female    DOB: 11/24/1975, 41 y.o.   MRN: [id number]  Chief Complaint  Patient presents with  . Follow-up    Asthma     Referring provider: [name], MD  HPI: [id number] with asthma all her life much worse since around 07/2010 referred by Dr [name] to pulmonary clinic in Sept 2012 Hx of Breast Cancer 05/2008 s/p chemo/mastectomy /XRT   TEST /Events  PFT's 01/31/2011 FEV1  1.76 (60% ) ratio 68 and no better p B2,  DLCO 70% -med review 09/25/2012 > Dulera drug assistance  paperwork given    - HFA 90% 11/20/2012 .  -Med calendar 12/07/12 , 04/06/2013 , 04/18/2014,, 08/30/2014 , 11/08/2014  - PFT's 01/12/2013  1.73 (65%)  Ratio 73 and no better p B2, DLCO 86% - trial off singulair 02/08/13 >>>did not stop  - 04/05/2014  trial of dulera 100 instead of 200 > worse  -04/18/2014 increase Dulera 200  - 08/01/2014 p extensive coaching HFA effectiveness =    90% - 08/01/2014  Walked RA x 2laps @ 185 ft each stopped due to cough > sob/ no desat moderate pace  - spirometry 08/01/2014  FEV1  1.87 (70%) ratio 78  fef 25-75 53%  - spirometry 02/10/2015  flattening of top portion of f/v loop only    - 02/10/2015   Walked RA  2 laps @ 185 ft each stopped due to cough > sob/ slow pace   -  NO 03/24/2015   =  16 - Methacholine challenge 04/04/2015   POS asthma = wheeze/sob but no coughing fits > added qvar 80 2bid 06/29/2015 >>>   -flare sarting Aug 19 2015 p exp to storage shed  - 08/31/2015  After extensive coaching HFA effectiveness =    75% (Ti too short)  - Spirometry 08/31/2015  FEV1 0.75 (28%)  Ratio 68  - FENO 11/17/2015  =  8  on symb 160 2bid and qvar 80 2bid  - FENO 03/22/2016  = 36 on symb 160/qvar 80 though not able to verify being used as directed  -MRI 05/18/15 > Paranasal sinuses are clear - 11/17/2015 rec increase h1 to 2 hs to help with noct cough  - Allergy profile 11/17/2015 >  Eos 0.1 /  IgE  80 POS RAST  to grass, ragweed, cedar tree   04/19/2016 Follow up ; Asthma and  Med Review  Pt returns for a 1 month follow up for asthma and med review.  Last visit with asthma flare , treated with steroid taper . She is feeling some better  W/ less cough /wheezing  Strong smellss, perfumes seems to be a trigger.  Denies chest pain, orthopnea, edema or fever.    Underwent Right frontal VP shunt placement psuedotumor cerbri Oct 2017 .  Still has headaches . Followed at [facility].   We revewied all her meds and organized them into a med calendar with pt education .  Appears to be taking correctly .      Allergies  Allergen Reactions  . Aspirin Shortness Of Breath and Palpitations    Pt can take ibuprofen without reaction  . Bactrim [Sulfamethoxazole-Trimethoprim] Anaphylaxis  . Doxycycline Anaphylaxis  . Penicillins Shortness Of Breath and Palpitations    Has patient had a PCN reaction causing immediate rash, facial/tongue/throat swelling, SOB or lightheadedness with hypotension: Yes Has patient had a PCN reaction causing severe rash involving mucus membranes or skin necrosis: Yes Has patient had a  PCN reaction that required hospitalization Yes Has patient had a PCN reaction occurring within the last 10 years: No If all of the above answers are "NO", then may proceed with Cephalosporin use.  Severiano Gilbert Extract Itching and Swelling    Lip swelling  . Strawberry Extract Hives    Immunization History  Administered Date(s) Administered  . Influenza Split 12/31/2010, 03/02/2012, 12/01/2015  . Influenza,inj,Quad PF,36+ Mos 02/09/2013, 02/02/2014, 12/06/2014, 12/05/2015  . Pneumococcal Conjugate-13 12/01/2014  . Pneumococcal Polysaccharide-23 03/02/2012  . Tdap 05/12/2011    Past Medical History:  Diagnosis Date  . Anemia, unspecified 04/13/2011  . Asthma   . Asthma 09/27/2010  . Bloating 03/18/2011  . Breast cancer ([place]) 2010   a. locally advanced left breast carcinoma diagnosed in 2010 and treated with neoadjuvant chemotherapy with docetaxel and cyclophosphamide  as well as paclitaxel. This was followed by radiation therapy which was completed in 2011.  Marland Kitchen Dyspnea on exertion    a. 01/2013 Lexi MV: EF 54%, no ischemia/infarct;  b. 05/2013 Echo: EF 60-65%, no rwma, Gr 2 DD;  b.   . Fatty liver   . Gastroparesis   . GERD (gastroesophageal reflux disease)   . Hypertension   . Impaired glucose tolerance 04/13/2011  . Lymphadenitis, chronic    restricted LEFT extremity  . 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 OSA (obstructive sleep apnea) 05/04/2014   AHI 31/hr now on CPAP at 12cm H2O    Tobacco History: History  Smoking Status  . Never Smoker  Smokeless Tobacco  . Never Used   Counseling given: Not Answered   Outpatient Encounter Prescriptions as of 04/19/2016  Medication Sig  . acetaminophen-codeine (TYLENOL #4) 300-60 MG tablet Take 1 tablet by mouth every 4 (four) hours as needed for moderate pain (or cough).  Marland Kitchen acetaZOLAMIDE (DIAMOX) 250 MG tablet Take 4 tablets (1,000 mg total) by mouth 3 (three) times daily.  Marland Kitchen albuterol (PROVENTIL) (2.5 MG/3ML) 0.083% nebulizer solution USE 1 VIAL VIA NEBULIZER EVERY 4 HOURS AS NEEDED( NEED APPOINTMENT)  . beclomethasone (QVAR) 80 MCG/ACT inhaler Inhale 2 puffs into the lungs 2 (two) times daily.  . budesonide-formoterol (SYMBICORT) 160-4.5 MCG/ACT inhaler Inhale 2 puffs into the lungs 2 (two) times daily.  . chlorthalidone (HYGROTON) 25 MG tablet Take 1 tablet (25 mg total) by mouth daily.  . cloNIDine (CATAPRES) 0.3 MG tablet TAKE 1 TABLET BY MOUTH TWICE DAILY  . dexlansoprazole (DEXILANT) 60 MG capsule Take 1 capsule (60 mg total) by mouth daily.  Marland Kitchen dicyclomine (BENTYL) 20 MG tablet Take 1 tablet (20 mg total) by mouth 3 (three) times daily as needed for spasms.  Marland Kitchen diltiazem (CARDIZEM LA) 420 MG 24 hr tablet Take 1 tablet (420 mg total) by mouth daily.  . DULoxetine (CYMBALTA) 60 MG capsule Take 1 capsule (60 mg total) by mouth daily.  Marland Kitchen EPINEPHrine 0.3 mg/0.3 mL IJ SOAJ injection Inject 0.3 mLs (0.3  mg total) into the muscle once as needed (anaphylaxis reaction).  . Eszopiclone 3 MG TABS Take 1 tablet (3 mg total) by mouth at bedtime as needed.  . Fluocinonide Emulsified Base 0.05 % CREA APPLY EXTERNALLY TO THE AFFECTED AREA TWICE DAILY  . hydrALAZINE (APRESOLINE) 100 MG tablet TAKE 1 TABLET(100 MG) BY MOUTH THREE TIMES DAILY  . lidocaine (XYLOCAINE) 5 % ointment Apply 1 application topically 2 (two) times daily as needed.  . metoCLOPramide (REGLAN) 10 MG tablet TAKE 1 TABLET BY MOUTH FOUR TIMES DAILY AT BEDTIME WITH MEALS  . mometasone (  NASONEX) 50 MCG/ACT nasal spray Place 2 sprays into the nose 2 (two) times daily.  . montelukast (SINGULAIR) 10 MG tablet Take 1 tablet (10 mg total) by mouth at bedtime.  . Nebivolol HCl (BYSTOLIC) 20 MG TABS 2 tabs by mouth per day  . NITROSTAT 0.4 MG SL tablet PLACE 1 TABLET UNDER TONGUE EVERY 5 MINUTES AS NEEDED FOR CHEST PAIN, MAX OF 3 TABLETS THEN CALL 911 IF PAIN PERSISTS  . OXYGEN Use CPAP at bedtime  . potassium chloride SA (K-DUR,KLOR-CON) 20 MEQ tablet Take 2 tablets (40 mEq total) by mouth daily.  . predniSONE (DELTASONE) 10 MG tablet Take  4 each am x 2 days,   2 each am x 2 days,  1 each am x 2 days and stop  . primidone (MYSOLINE) 50 MG tablet Take 50 mg by mouth 2 (two) times daily.   . promethazine (PHENERGAN) 12.5 MG tablet Take 1-2 tablets by mouth every 8 hours as needed. Beware of sedation  . ranolazine (RANEXA) 500 MG 12 hr tablet Take 1 tablet (500 mg total) by mouth 2 (two) times daily.  Marland Kitchen Respiratory Therapy Supplies (FLUTTER) DEVI Use as directed  . spironolactone (ALDACTONE) 50 MG tablet Take 1 tablet (50 mg total) by mouth daily.  Marland Kitchen topiramate (TOPAMAX) 25 MG tablet Take 25 mg by mouth 2 (two) times daily.  . traMADol (ULTRAM) 50 MG tablet Take 1 tablet (50 mg total) by mouth every 6 (six) hours as needed.  . traZODone (DESYREL) 150 MG tablet Take 50 mg by mouth at bedtime.   . valsartan (DIOVAN) 160 MG tablet Take 1 tablet (160  mg total) by mouth daily.  . VENTOLIN HFA 108 (90 Base) MCG/ACT inhaler INHALE 2 PUFFS INTO THE LUNGS EVERY 4 HOURS AS NEEDED FOR WHEEZING OR SHORTNESS OF BREATH   No facility-administered encounter medications on file as of 04/19/2016.      Review of Systems  Constitutional:   No  weight loss, night sweats,  Fevers, chills,  +fatigue, or  lassitude.  HEENT:   No headaches,  Difficulty swallowing,  Tooth/dental problems, or  Sore throat,                No sneezing, itching, ear ache,  +nasal congestion, post nasal drip,   CV:  No chest pain,  Orthopnea, PND, swelling in lower extremities, anasarca, dizziness, palpitations, syncope.    GI  No heartburn, indigestion, abdominal pain, nausea, vomiting, diarrhea, change in bowel habits, loss of appetite, bloody stools.   Resp:  ,  No non-productive cough,  No coughing up of blood.  No change in color of mucus.  No wheezing.  No chest wall deformity  Skin: no rash or lesions.  GU: no dysuria, change in color of urine, no urgency or frequency.  No flank pain, no hematuria   MS:  No joint pain or swelling.  No decreased range of motion.  No back pain.    Physical Exam  Pulse 100   Temp 98.9 F (37.2 C) (Oral)   Ht 5\' 6"  (1.676 m)   Wt 191 lb (86.6 kg)   SpO2 97%   BMI 30.83 kg/m   GEN: A/Ox3; pleasant , NAD    HEENT:  [place]/AT,  EACs-clear, TMs-wnl, NOSE-clear drainage  THROAT-clear, no lesions, no postnasal drip or exudate noted.   NECK:  Supple w/ fair ROM; no JVD; normal carotid impulses w/o bruits; no thyromegaly or nodules palpated; no lymphadenopathy.    RESP  Clear  P & A; w/o, wheezes/ rales/ or rhonchi. no accessory muscle use, no dullness to percussion  CARD:  RRR, no m/r/g, no peripheral edema, pulses intact, no cyanosis or clubbing.  GI:   Soft & nt; nml bowel sounds; no organomegaly or masses detected.   Musco: Warm bil, no deformities or joint swelling noted.   Neuro: alert, no focal deficits noted.    Skin:  Warm, no lesions or rashes  Psych:  No change in mood or affect. No depression or anxiety.  No memory loss.  Lab Results:  CBC    Component Value Date/Time   WBC 7.2 04/11/2016 1431   WBC 7.4 01/31/2016 1813   RBC 4.67 04/11/2016 1431   RBC 4.69 01/31/2016 1813   HGB 14.1 04/11/2016 1431   HCT 41.3 04/11/2016 1431   PLT 246 04/11/2016 1431   MCV 88.4 04/11/2016 1431   MCH 30.2 04/11/2016 1431   MCH 30.3 01/31/2016 1813   MCHC 34.1 04/11/2016 1431   MCHC 34.0 01/31/2016 1813   RDW 13.5 04/11/2016 1431   LYMPHSABS 2.5 04/11/2016 1431   MONOABS 0.4 04/11/2016 1431   EOSABS 0.1 04/11/2016 1431   BASOSABS 0.0 04/11/2016 1431    BMET    Component Value Date/Time   NA 143 04/11/2016 1100   NA 141 03/14/2016 1421   K 3.7 04/11/2016 1100   K 3.6 03/14/2016 1421   CL 99 04/11/2016 1100   CL 103 09/10/2012 1618   CO2 25 04/11/2016 1100   CO2 26 03/14/2016 1421   GLUCOSE 99 04/11/2016 1100   GLUCOSE 112 03/14/2016 1421   GLUCOSE 86 09/10/2012 1618   BUN 10 04/11/2016 1100   BUN 12.9 03/14/2016 1421   CREATININE 0.82 04/11/2016 1100   CREATININE 1.1 03/14/2016 1421   CALCIUM 9.1 04/11/2016 1100   CALCIUM 9.6 03/14/2016 1421   GFRNONAA 89 04/11/2016 1100   GFRAA 102 04/11/2016 1100    BNP No results found for: BNP  ProBNP    Component Value Date/Time   PROBNP 16.0 08/01/2014 1120    Imaging: No results found.   Assessment & Plan:   Cough variant asthma Recent flare , some improvement on maximum regimen  Patient's medications were reviewed today and patient education was given. Computerized medication calendar was adjusted/completed   Plan  Patient Instructions  Follow med calendar closely and bring to each visit.  Follow up Dr. Melvyn Novas  In 4 months and As needed        Upper airway cough syndrome Cont on cough prevention regimen  Pt education on narcotic use.   Plan  Patient Instructions  Follow med calendar closely and bring to each visit.    Follow up Dr. Melvyn Novas  In 4 months and As needed        Hypertension Follow up with PCP for elevated b/p .      Rexene Edison, NP 04/19/2016

## 2016-04-19 NOTE — Assessment & Plan Note (Signed)
Cont on cough prevention regimen  Pt education on narcotic use.   Plan  Patient Instructions  Follow med calendar closely and bring to each visit.  Follow up Dr. Melvyn Novas  In 4 months and As needed

## 2016-04-19 NOTE — Progress Notes (Signed)
Chart and office note reviewed in detail  > agree with a/p as outlined    

## 2016-04-19 NOTE — Patient Instructions (Signed)
Follow med calendar closely and bring to each visit.  Follow up Dr. Melvyn Novas  In 4 months and As needed

## 2016-04-19 NOTE — Assessment & Plan Note (Signed)
Recent flare , some improvement on maximum regimen  Patient's medications were reviewed today and patient education was given. Computerized medication calendar was adjusted/completed   Plan  Patient Instructions  Follow med calendar closely and bring to each visit.  Follow up Dr. Melvyn Novas  In 4 months and As needed

## 2016-04-22 ENCOUNTER — Encounter: Payer: Self-pay | Admitting: Pharmacist

## 2016-04-22 ENCOUNTER — Ambulatory Visit (INDEPENDENT_AMBULATORY_CARE_PROVIDER_SITE_OTHER): Payer: Medicare Other | Admitting: Pharmacist

## 2016-04-22 VITALS — BP 178/96 | HR 107 | Wt 188.2 lb

## 2016-04-22 DIAGNOSIS — I119 Hypertensive heart disease without heart failure: Secondary | ICD-10-CM

## 2016-04-22 DIAGNOSIS — I10 Essential (primary) hypertension: Secondary | ICD-10-CM | POA: Diagnosis not present

## 2016-04-22 MED ORDER — VALSARTAN 160 MG PO TABS: 320.0000 mg | ORAL_TABLET | Freq: Every day | ORAL | 2 refills | Status: DC

## 2016-04-22 NOTE — Patient Instructions (Addendum)
Return for a follow up appointment in 2 weeks  Check your blood pressure at home daily (if able) and keep record of the readings.  Take your BP meds as follows: INCREASE valsartan to 320mg  daily  DECREASE potassium to 2 tablets once a day Continue all other medications as prescribed.    Bring all of your meds, your BP cuff and your record of home blood pressures to your next appointment.  Exercise as you're able, try to walk approximately 30 minutes per day.  Keep salt intake to a minimum, especially watch canned and prepared boxed foods.  Eat more fresh fruits and vegetables and fewer canned items.  Avoid eating in fast food restaurants.    HOW TO TAKE YOUR BLOOD PRESSURE: . Rest 5 minutes before taking your blood pressure. .  Don't smoke or drink caffeinated beverages for at least 30 minutes before. . Take your blood pressure before (not after) you eat. . Sit comfortably with your back supported and both feet on the floor (don't cross your legs). . Elevate your arm to heart level on a table or a desk. . Use the proper sized cuff. It should fit smoothly and snugly around your bare upper arm. There should be enough room to slip a fingertip under the cuff. The bottom edge of the cuff should be 1 inch above the crease of the elbow. . Ideally, take 3 measurements at one sitting and record the average.

## 2016-04-22 NOTE — Progress Notes (Signed)
Patient ID: Erica [name]                 DOB: 1975/10/25                      MRN: [id number]     HPI: Erica [name] is a 41 y.o. female patient of Dr. Meda Coffee with PMH below who presents today for hypertension evaluation. She also has history of recurrent syncope. The cardio monitor showed only sinus rhythm. 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 Sleep study showed severe sleep apnea but she is noncompliant with CPap machine. 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 She has had a renal artery scan on 10/13/15 that was negative. She also had a normal TSH in March of 2017.   Earlier this month her HCTZ was discontinued and she was started on valsartan, chlorthalidone and her spironolactone was increased. She reports today that she has been taking potassium 74mEq BID. She states she has been doing well on her current medications, but she is still learning the names and doses of the new medications.   She reports today that she is considered "noncompliant" with her CPAP because she does not sleep more than 4 hours at a time. She reports she has been this way her whole life and she does use the CPAP machine when she is sleeping.   She takes her morning medications at 10 am, her mid-day medications at about 2 pm, and her evening medications between 8-10pm. She uses a pill box that Clemetine Marker, NP has helped her fill. She reports compliance with medications stating that she rarely if ever misses dose. I called the pharmacy to check compliance and per the pharmacy's report she has been picking up medications on time.   She does use her albuterol several times a day. She reports she last used Epi pen about 1 month ago.    Cardiac Hx: CHF, HTN, idiopathic intracranial hypertension, asthma, sleep apnea, obesity, hypertriglyceridemia  Current HTN meds:  Valsartan 160mg  daily in the morning - filled  Spironolactone 50mg  daily in the  morning - just filled Chlorthalidone 25mg  daily in the morning - filled nebivolol 40mg  daily in the morning - last filled Nov 9th, 2017 for 90 day supply Hydralazine 100mg  TID - 02/08/16 90 days   Diltiazem 420mg  daily in the morning - 02/08/16 90 days Clonidine 0.3mg  BID morning and evening - last filled 02/11/2016 for 90 days  Previously tried:  Amlodipine - did not work to control pressure Imdur - worsening headaches   BP goal: <130/90  Family History: She states that none of her close family members have known hypertension or other cardiac issues.   Social History: She denies tobacco and endorses only rare social alcohol consumption.   Diet: She eats most of her meals from home and prepares them without salt. She uses pepper and garlic to season. She does endorse 2-3 Pepsis per day and states that without these she becomes increasingly nauseated and the caffeine helps with her headaches.   Exercise: She tries to walk though she can barely make it 10 minutes without getting SOB. She states she tried P90x once with her daughters and had to use a breathing treatment.   Home BP readings:  Her home cuff recently broke  Wt Readings from Last 3 Encounters:  04/22/16 188 lb 4 oz (85.4 kg)  04/19/16 191 lb (86.6 kg)  04/04/16 192  lb (87.1 kg)   BP Readings from Last 3 Encounters:  04/22/16 (!) 178/96  04/19/16 (!) 180/100  04/11/16 (!) 170/90   Pulse Readings from Last 3 Encounters:  04/22/16 (!) 107  04/19/16 100  04/11/16 92    Renal function: Estimated Creatinine Clearance: 98.3 mL/min (by C-G formula based on SCr of 0.82 mg/dL).  Past Medical History:  Diagnosis Date  . Anemia, unspecified 04/13/2011  . Asthma   . Asthma 09/27/2010  . Bloating 03/18/2011  . Breast cancer ([place]) 2010   a. locally advanced left breast carcinoma diagnosed in 2010 and treated with neoadjuvant chemotherapy with docetaxel and cyclophosphamide as well as paclitaxel. This was followed by  radiation therapy which was completed in 2011.  Marland Kitchen Dyspnea on exertion    a. 01/2013 Lexi MV: EF 54%, no ischemia/infarct;  b. 05/2013 Echo: EF 60-65%, no rwma, Gr 2 DD;  b.   . Fatty liver   . Gastroparesis   . GERD (gastroesophageal reflux disease)   . Hypertension   . Impaired glucose tolerance 04/13/2011  . Lymphadenitis, chronic    restricted LEFT extremity  . 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 OSA (obstructive sleep apnea) 05/04/2014   AHI 31/hr now on CPAP at 12cm H2O    Current Outpatient Prescriptions on File Prior to Visit  Medication Sig Dispense Refill  . acetaminophen-codeine (TYLENOL #4) 300-60 MG tablet Take 1 tablet by mouth every 4 (four) hours as needed for moderate pain (or cough). 30 tablet 0  . acetaZOLAMIDE (DIAMOX) 250 MG tablet Take 4 tablets (1,000 mg total) by mouth 3 (three) times daily. 400 tablet 11  . albuterol (PROVENTIL) (2.5 MG/3ML) 0.083% nebulizer solution USE 1 VIAL VIA NEBULIZER EVERY 4 HOURS AS NEEDED( NEED APPOINTMENT) 300 mL 2  . beclomethasone (QVAR) 80 MCG/ACT inhaler Inhale 2 puffs into the lungs 2 (two) times daily. 1 Inhaler 5  . budesonide-formoterol (SYMBICORT) 160-4.5 MCG/ACT inhaler Inhale 2 puffs into the lungs 2 (two) times daily. 10.2 g 5  . chlorthalidone (HYGROTON) 25 MG tablet Take 1 tablet (25 mg total) by mouth daily. 90 tablet 2  . cloNIDine (CATAPRES) 0.3 MG tablet TAKE 1 TABLET BY MOUTH TWICE DAILY 180 tablet 3  . dexlansoprazole (DEXILANT) 60 MG capsule Take 1 capsule (60 mg total) by mouth daily. 30 capsule 5  . dicyclomine (BENTYL) 20 MG tablet Take 1 tablet (20 mg total) by mouth 3 (three) times daily as needed for spasms. 90 tablet 2  . diltiazem (CARDIZEM LA) 420 MG 24 hr tablet Take 1 tablet (420 mg total) by mouth daily. 90 tablet 3  . DULoxetine (CYMBALTA) 60 MG capsule Take 1 capsule (60 mg total) by mouth daily. 90 capsule 3  . Fluocinonide Emulsified Base 0.05 % CREA APPLY EXTERNALLY TO THE AFFECTED AREA TWICE DAILY  60 g 1  . hydrALAZINE (APRESOLINE) 100 MG tablet TAKE 1 TABLET(100 MG) BY MOUTH THREE TIMES DAILY 90 tablet 0  . lidocaine (XYLOCAINE) 5 % ointment Apply 1 application topically 2 (two) times daily as needed. 50 g 5  . metoCLOPramide (REGLAN) 10 MG tablet TAKE 1 TABLET BY MOUTH FOUR TIMES DAILY AT BEDTIME WITH MEALS 120 tablet 5  . mometasone (NASONEX) 50 MCG/ACT nasal spray Place 2 sprays into the nose 2 (two) times daily. 17 g 5  . montelukast (SINGULAIR) 10 MG tablet Take 1 tablet (10 mg total) by mouth at bedtime. 30 tablet 5  . Nebivolol HCl (BYSTOLIC) 20 MG TABS 2 tabs  by mouth per day 180 tablet 3  . OXYGEN Use CPAP at bedtime    . potassium chloride SA (K-DUR,KLOR-CON) 20 MEQ tablet Take 2 tablets (40 mEq total) by mouth daily. (Patient taking differently: Take 40 mEq by mouth 2 (two) times daily. ) 180 tablet 2  . primidone (MYSOLINE) 50 MG tablet Take 50 mg by mouth 2 (two) times daily.     . promethazine (PHENERGAN) 12.5 MG tablet Take 1-2 tablets by mouth every 8 hours as needed. Beware of sedation 50 tablet 2  . ranolazine (RANEXA) 500 MG 12 hr tablet Take 1 tablet (500 mg total) by mouth 2 (two) times daily. 180 tablet 1  . Respiratory Therapy Supplies (FLUTTER) DEVI Use as directed 1 each 0  . spironolactone (ALDACTONE) 50 MG tablet Take 1 tablet (50 mg total) by mouth daily. 90 tablet 2  . topiramate (TOPAMAX) 25 MG tablet Take 25 mg by mouth 2 (two) times daily.    . traMADol (ULTRAM) 50 MG tablet Take 1 tablet (50 mg total) by mouth every 6 (six) hours as needed. 60 tablet 1  . traZODone (DESYREL) 150 MG tablet Take 50 mg by mouth at bedtime.   1  . VENTOLIN HFA 108 (90 Base) MCG/ACT inhaler INHALE 2 PUFFS INTO THE LUNGS EVERY 4 HOURS AS NEEDED FOR WHEEZING OR SHORTNESS OF BREATH 18 g 5  . EPINEPHrine 0.3 mg/0.3 mL IJ SOAJ injection Inject 0.3 mLs (0.3 mg total) into the muscle once as needed (anaphylaxis reaction). (Patient not taking: Reported on 04/22/2016) 2 Device 5  .  NITROSTAT 0.4 MG SL tablet PLACE 1 TABLET UNDER TONGUE EVERY 5 MINUTES AS NEEDED FOR CHEST PAIN, MAX OF 3 TABLETS THEN CALL 911 IF PAIN PERSISTS (Patient not taking: Reported on 04/22/2016) 100 tablet 0   No current facility-administered medications on file prior to visit.     Allergies  Allergen Reactions  . Aspirin Shortness Of Breath and Palpitations    Pt can take ibuprofen without reaction  . Bactrim [Sulfamethoxazole-Trimethoprim] Anaphylaxis  . Doxycycline Anaphylaxis  . Penicillins Shortness Of Breath and Palpitations    Has patient had a PCN reaction causing immediate rash, facial/tongue/throat swelling, SOB or lightheadedness with hypotension: Yes Has patient had a PCN reaction causing severe rash involving mucus membranes or skin necrosis: Yes Has patient had a PCN reaction that required hospitalization Yes Has patient had a PCN reaction occurring within the last 10 years: No If all of the above answers are "NO", then may proceed with Cephalosporin use.  Severiano Gilbert Extract Itching and Swelling    Lip swelling  . Strawberry Extract Hives    Blood pressure (!) 178/96, pulse (!) 107, weight 188 lb 4 oz (85.4 kg), SpO2 98 %.   Assessment/Plan: Hypertension: BP today remains elevated, despite reported compliance with medications. She is on 7 different blood pressure agents and pressure remain uncontrolled. Other usual workup for elevated pressures have all returned negative. Will increase her valsartan to 320mg  and decrease her potassium to 32mEq daily (the previously prescribed dose). Will see her back in 3 weeks (pt will be out of town until then) for repeat BMET and follow up hypertension visit. If at that time her pressure remains elevated plan to titrate her spironolactone.    Thank you, Lelan Pons. Patterson Hammersmith, [facility]  04/22/2016 2:58 PM

## 2016-04-29 ENCOUNTER — Ambulatory Visit (INDEPENDENT_AMBULATORY_CARE_PROVIDER_SITE_OTHER): Payer: Medicare Other | Admitting: Neurology

## 2016-04-29 ENCOUNTER — Encounter: Payer: Self-pay | Admitting: Neurology

## 2016-04-29 ENCOUNTER — Emergency Department (HOSPITAL_COMMUNITY)
Admission: EM | Admit: 2016-04-29 | Discharge: 2016-04-29 | Disposition: A | Discharge status: Home / Self Care | Payer: Medicare Other | Attending: Emergency Medicine | Admitting: Emergency Medicine

## 2016-04-29 ENCOUNTER — Encounter (HOSPITAL_COMMUNITY): Payer: Self-pay | Admitting: Emergency Medicine

## 2016-04-29 VITALS — BP 170/88 | HR 112 | Ht 66.0 in | Wt 188.2 lb

## 2016-04-29 DIAGNOSIS — G250 Essential tremor: Secondary | ICD-10-CM

## 2016-04-29 DIAGNOSIS — I130 Hypertensive heart and chronic kidney disease with heart failure and stage 1 through stage 4 chronic kidney disease, or unspecified chronic kidney disease: Secondary | ICD-10-CM | POA: Diagnosis not present

## 2016-04-29 DIAGNOSIS — J4521 Mild intermittent asthma with (acute) exacerbation: Secondary | ICD-10-CM

## 2016-04-29 DIAGNOSIS — G620 Drug-induced polyneuropathy: Secondary | ICD-10-CM

## 2016-04-29 DIAGNOSIS — T451X5A Adverse effect of antineoplastic and immunosuppressive drugs, initial encounter: Secondary | ICD-10-CM

## 2016-04-29 DIAGNOSIS — Z853 Personal history of malignant neoplasm of breast: Secondary | ICD-10-CM | POA: Diagnosis not present

## 2016-04-29 DIAGNOSIS — I5032 Chronic diastolic (congestive) heart failure: Secondary | ICD-10-CM | POA: Insufficient documentation

## 2016-04-29 DIAGNOSIS — N189 Chronic kidney disease, unspecified: Secondary | ICD-10-CM | POA: Diagnosis not present

## 2016-04-29 DIAGNOSIS — J45909 Unspecified asthma, uncomplicated: Secondary | ICD-10-CM | POA: Diagnosis present

## 2016-04-29 DIAGNOSIS — B370 Candidal stomatitis: Secondary | ICD-10-CM | POA: Diagnosis not present

## 2016-04-29 DIAGNOSIS — G932 Benign intracranial hypertension: Secondary | ICD-10-CM | POA: Diagnosis not present

## 2016-04-29 MED ORDER — FLUTICASONE PROPIONATE 50 MCG/ACT NA SUSP: 2.0000 | Freq: Every day | NASAL | 0 refills | Status: DC

## 2016-04-29 MED ORDER — LORATADINE 10 MG PO TABS: 10.0000 mg | ORAL_TABLET | Freq: Every day | ORAL | 0 refills | Status: DC

## 2016-04-29 MED ORDER — OSELTAMIVIR PHOSPHATE 75 MG PO CAPS: 75.0000 mg | ORAL_CAPSULE | Freq: Two times a day (BID) | ORAL | 0 refills | Status: DC

## 2016-04-29 MED ORDER — MAGIC MOUTHWASH: 5.0000 mL | Freq: Every day | ORAL | 0 refills | Status: DC

## 2016-04-29 MED ORDER — TOPIRAMATE 50 MG PO TABS: ORAL_TABLET | ORAL | 3 refills | Status: DC

## 2016-04-29 MED ORDER — PREDNISONE 20 MG PO TABS
40.0000 mg | ORAL_TABLET | Freq: Once | ORAL | Status: AC
  Administered 2016-04-29: 40 mg via ORAL
  Filled 2016-04-29: qty 2

## 2016-04-29 MED ORDER — PREDNISONE 10 MG PO TABS: 40.0000 mg | ORAL_TABLET | Freq: Every day | ORAL | 0 refills | Status: DC

## 2016-04-29 MED ORDER — IPRATROPIUM-ALBUTEROL 0.5-2.5 (3) MG/3ML IN SOLN
3.0000 mL | Freq: Once | RESPIRATORY_TRACT | Status: AC
  Administered 2016-04-29: 3 mL via RESPIRATORY_TRACT
  Filled 2016-04-29: qty 3

## 2016-04-29 MED ORDER — ALBUTEROL SULFATE (2.5 MG/3ML) 0.083% IN NEBU
5.0000 mg | INHALATION_SOLUTION | Freq: Once | RESPIRATORY_TRACT | Status: AC
  Administered 2016-04-29: 5 mg via RESPIRATORY_TRACT
  Filled 2016-04-29: qty 6

## 2016-04-29 NOTE — ED Provider Notes (Signed)
[facility] DEPT Provider Note   CSN: [id number] Arrival date & time: 04/29/16  1757   By signing my name below, I, Delton Prairie, attest that this documentation has been prepared under the direction and in the presence of  [employer]. Electronically Signed: Delton Prairie, ED Scribe. 04/29/16. 7:03 PM.   History   Chief Complaint Chief Complaint  Patient presents with  . Asthma   The history is provided by the patient. No language interpreter was used.   HPI Comments:  Erica [name] is a 41 y.o. female, with a hx of asthma, HTN, OSA, recurrent syncope, idiopathic intracranial hypertension (s/p placement of shunt in October 2017), breast cancer (diagnosed 2010), who presents to the Emergency Department complaining of worsening asthma exacerbation onset yesterday. Pt has used her at home nebulizer 4 times and had 1 nebulizer treatment in the ED with some relief. Pt also reports low grade fever, cough and chest soreness secondary to coughing. Pt states she has a chronic cough and is on multiple asthma medication, is followed by Pulmonologist (dr. Melvyn Novas) for recurrent cough.  She has taken tylenol with relief for her fever. Pt denies nasal congestion, sore throat, abdominal pain, nausea, vomiting, diarrhea, allergy medication use and sick contacts. Pt is a non smoker and denies exposure to second hand smoking at home. She has never been intubated in the past for asthma exacerbation.   Past Medical History:  Diagnosis Date  . Anemia, unspecified 04/13/2011  . Asthma   . Asthma 09/27/2010  . Bloating 03/18/2011  . Breast cancer ([name]) 2010   a. locally advanced left breast carcinoma diagnosed in 2010 and treated with neoadjuvant chemotherapy with docetaxel and cyclophosphamide as well as paclitaxel. This was followed by radiation therapy which was completed in 2011.  Marland Kitchen Dyspnea on exertion    a. 01/2013 Lexi MV: EF 54%, no ischemia/infarct;  b. 05/2013 Echo: EF 60-65%, no rwma, Gr 2 DD;   b.   . Fatty liver   . Gastroparesis   . GERD (gastroesophageal reflux disease)   . Hypertension   . Impaired glucose tolerance 04/13/2011  . Lymphadenitis, chronic    restricted LEFT extremity  . 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 OSA (obstructive sleep apnea) 05/04/2014   AHI 31/hr now on CPAP at 12cm H2O    Patient Active Problem List   Diagnosis Date Noted  . Asthma without status asthmaticus 01/19/2016  . Chronic kidney disease 01/19/2016  . History of left breast cancer 01/19/2016  . Lymphedema 01/19/2016  . Neuropathy ([place]) 01/19/2016  . Hypertensive heart disease 09/26/2015  . 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 Hypertriglyceridemia without hypercholesterolemia 06/15/2015  . Hypokalemia 06/15/2015  . Insomnia secondary to anxiety 04/05/2015  . Eczema of right hand 01/23/2015  . Idiopathic intracranial hypertension 10/21/2014  . Upper airway cough syndrome 10/09/2014  . Hypertension 10/09/2014  . Mild obesity 10/09/2014  . Apnea, sleep 05/04/2014  . Daytime somnolence 02/10/2014  . Chronic diastolic CHF (congestive heart failure) ([place]) 06/23/2013  . Breast cancer of upper-outer quadrant of left female breast ([place]) 01/07/2013  . Gastroparesis 01/04/2013  . Axillary hidradenitis suppurativa 08/01/2011  . Preventative health care 04/13/2011  . Hyperglycemia 04/13/2011  . GERD (gastroesophageal reflux disease) 03/18/2011  . Cough variant asthma 09/27/2010    Past Surgical History:  Procedure Laterality Date  . BRAIN SURGERY     Shunt  . BREAST LUMPECTOMY Left 09/2008  . CARPAL TUNNEL RELEASE Left 2011  . LYMPH NODE DISSECTION  Left 2010   breast; 2 wks after breast lumpectomy  . TENDON RELEASE Right 2012   "hand"  . TUBAL LIGATION  05/11/2011   Procedure: POST PARTUM TUBAL LIGATION;  Surgeon: Emeterio Reeve, MD;  Location: [facility];  Service: Gynecology;  Laterality: Bilateral;  Induced for HTN    OB History    Gravida Para Term Preterm AB Living   5 4  4   1 4    SAB TAB Ectopic Multiple Live Births   1       4       Home Medications    Prior to Admission medications   Medication Sig Start Date End Date Taking? Authorizing Provider  acetaminophen-codeine (TYLENOL #4) 300-60 MG tablet Take 1 tablet by mouth every 4 (four) hours as needed for moderate pain (or cough). 04/19/16   Tammy S Parrett, NP  acetaZOLAMIDE (DIAMOX) 250 MG tablet Take 4 tablets (1,000 mg total) by mouth 3 (three) times daily. 12/08/15   Donika K Patel, DO  albuterol (PROVENTIL) (2.5 MG/3ML) 0.083% nebulizer solution USE 1 VIAL VIA NEBULIZER EVERY 4 HOURS AS NEEDED( NEED APPOINTMENT) 08/30/15   Tanda Rockers, MD  amitriptyline (ELAVIL) 100 MG tablet Take 100 mg by mouth at bedtime.    Historical Provider, MD  amLODipine (NORVASC) 10 MG tablet Take 10 mg by mouth daily.    Historical Provider, MD  beclomethasone (QVAR) 80 MCG/ACT inhaler Inhale 2 puffs into the lungs 2 (two) times daily. 04/05/16   Tanda Rockers, MD  Biotin 10000 MCG TABS Take 1 tablet by mouth daily.    Historical Provider, MD  budesonide-formoterol (SYMBICORT) 160-4.5 MCG/ACT inhaler Inhale 2 puffs into the lungs 2 (two) times daily. 04/05/16   Tanda Rockers, MD  chlorpheniramine (CHLOR-TRIMETON) 4 MG tablet Take 8 mg by mouth at bedtime.    Historical Provider, MD  chlorthalidone (HYGROTON) 25 MG tablet Take 1 tablet (25 mg total) by mouth daily. 04/04/16   [name], MD  cloNIDine (CATAPRES) 0.3 MG tablet TAKE 1 TABLET BY MOUTH TWICE DAILY 09/08/15   [name], MD  dexlansoprazole (DEXILANT) 60 MG capsule Take 1 capsule (60 mg total) by mouth daily. 01/10/16   Jerene Bears, MD  dextromethorphan-guaiFENesin (MUCINEX DM) 30-600 MG 12hr tablet Take 1 tablet by mouth 2 (two) times daily as needed for cough.    Historical Provider, MD  dicyclomine (BENTYL) 20 MG tablet Take 1 tablet (20 mg total) by mouth 3 (three) times daily as needed for spasms. 01/10/16   Jerene Bears, MD  diltiazem (CARDIZEM  LA) 420 MG 24 hr tablet Take 1 tablet (420 mg total) by mouth daily. 04/07/15   [name], MD  DULoxetine (CYMBALTA) 60 MG capsule Take 1 capsule (60 mg total) by mouth daily. 10/26/15   Donika K Patel, DO  EPINEPHrine 0.3 mg/0.3 mL IJ SOAJ injection Inject 0.3 mLs (0.3 mg total) into the muscle once as needed (anaphylaxis reaction). 09/11/15   [name], MD  fluticasone (FLONASE) 50 MCG/ACT nasal spray Place 2 sprays into both nostrils daily. 04/29/16   Kinnie Feil, PA-C  furosemide (LASIX) 20 MG tablet Take 20 mg by mouth daily as needed.    Historical Provider, MD  hydrALAZINE (APRESOLINE) 100 MG tablet TAKE 1 TABLET(100 MG) BY MOUTH THREE TIMES DAILY 02/08/16   Biagio Borg, MD  hydrochlorothiazide (MICROZIDE) 12.5 MG capsule Take 12.5 mg by mouth daily.    Historical Provider, MD  loratadine (  CLARITIN) 10 MG tablet Take 1 tablet (10 mg total) by mouth daily. 04/29/16   Kinnie Feil, PA-C  LORazepam (ATIVAN) 0.5 MG tablet Take 0.5 mg by mouth every 8 (eight) hours as needed for anxiety.    Historical Provider, MD  magic mouthwash SOLN Take 5 mLs by mouth daily. 04/29/16   Kinnie Feil, PA-C  metoCLOPramide (REGLAN) 10 MG tablet TAKE 1 TABLET BY MOUTH FOUR TIMES DAILY AT BEDTIME WITH MEALS 01/10/16   Jerene Bears, MD  mometasone (NASONEX) 50 MCG/ACT nasal spray Place 2 sprays into the nose 2 (two) times daily. 07/18/15   Tammy S Parrett, NP  montelukast (SINGULAIR) 10 MG tablet Take 1 tablet (10 mg total) by mouth at bedtime. 04/05/16   Tanda Rockers, MD  Nebivolol HCl (BYSTOLIC) 20 MG TABS 2 tabs by mouth per day Patient taking differently: 1 tab by mouth per day 11/03/15   Biagio Borg, MD  NITROSTAT 0.4 MG SL tablet PLACE 1 TABLET UNDER TONGUE EVERY 5 MINUTES AS NEEDED FOR CHEST PAIN, MAX OF 3 TABLETS THEN CALL 911 IF PAIN PERSISTS 10/26/15   [name], MD  ondansetron (ZOFRAN) 4 MG tablet Take 4 mg by mouth every 6 (six) hours as needed for nausea or vomiting.     Historical Provider, MD  oseltamivir (TAMIFLU) 75 MG capsule Take 1 capsule (75 mg total) by mouth every 12 (twelve) hours. 04/29/16   Kinnie Feil, PA-C  OXYGEN Use CPAP at bedtime    Historical Provider, MD  potassium chloride SA (K-DUR,KLOR-CON) 20 MEQ tablet Take 2 tablets (40 mEq total) by mouth daily. Patient taking differently: Take 20 mEq by mouth 2 (two) times daily.  04/04/16   [name], MD  predniSONE (DELTASONE) 10 MG tablet Take 4 tablets (40 mg total) by mouth daily. 04/29/16   Kinnie Feil, PA-C  pregabalin (LYRICA) 200 MG capsule Take 200 mg by mouth 3 (three) times daily.    Historical Provider, MD  primidone (MYSOLINE) 50 MG tablet Take 1/2 tab daily 01/23/15   Historical Provider, MD  promethazine (PHENERGAN) 12.5 MG tablet Take 1-2 tablets by mouth every 8 hours as needed. Beware of sedation 01/10/16   Jerene Bears, MD  ranolazine (RANEXA) 500 MG 12 hr tablet Take 1 tablet (500 mg total) by mouth 2 (two) times daily. 01/22/16   Imogene Burn, PA-C  Respiratory Therapy Supplies (FLUTTER) DEVI Use as directed 03/24/15   Tanda Rockers, MD  sodium chloride (OCEAN) 0.65 % SOLN nasal spray Place 1 spray into both nostrils as needed for congestion.    Historical Provider, MD  spironolactone (ALDACTONE) 50 MG tablet Take 1 tablet (50 mg total) by mouth daily. 04/04/16   [name], MD  topiramate (TOPAMAX) 50 MG tablet Take 1 tablet in the monring and 2 at bedtime. Patient taking differently: 25 mg 2 (two) times daily. Take 1 tablet in the monring and 2 at bedtime. 04/29/16   Donika Keith Rake, DO  traZODone (DESYREL) 150 MG tablet Take 150 mg by mouth at bedtime.  09/08/15   Historical Provider, MD  valsartan (DIOVAN) 160 MG tablet Take 2 tablets (320 mg total) by mouth daily. Patient taking differently: Take 160 mg by mouth daily.  04/22/16   [name], MD  VENTOLIN HFA 108 (90 Base) MCG/ACT inhaler INHALE 2 PUFFS INTO THE LUNGS EVERY 4 HOURS AS NEEDED FOR  WHEEZING OR SHORTNESS OF BREATH 10/27/15   Legrand Como  Mckinley Jewel, MD    Family History Family History  Problem Relation Age of Onset  . Other Mother 26    breast calcifications treated with surgery, breast cancer pill, and radiation  . Fibroids Sister 13    s/p TAH-BSO  . Diabetes Maternal Grandmother   . Heart disease Maternal Grandmother   . Hypertension Maternal Grandmother   . Breast cancer Other     maternal great aunt (MGM's sister)  . Breast cancer Other     paternal great aunt dx middle ages; s/p mastectomy  . Cancer Neg Hx   . Alcohol abuse Neg Hx   . Early death Neg Hx   . Hyperlipidemia Neg Hx   . Kidney disease Neg Hx   . Stroke Neg Hx   . Colon cancer Neg Hx     Social History Social History  Substance Use Topics  . Smoking status: Never Smoker  . Smokeless tobacco: Never Used  . Alcohol use 0.0 oz/week     Comment: occasionally/socially     Allergies   Aspirin; Bactrim [sulfamethoxazole-trimethoprim]; Doxycycline; Penicillins; Kiwi extract; and Strawberry extract   Review of Systems Review of Systems  Constitutional: Positive for fever (low grade).  HENT: Negative for congestion and sore throat.   Eyes: Negative for visual disturbance.  Respiratory: Positive for cough and shortness of breath.   Cardiovascular: Positive for chest pain.  Gastrointestinal: Negative for abdominal pain, constipation, diarrhea, nausea and vomiting.  Genitourinary: Negative for difficulty urinating.  Musculoskeletal: Negative for arthralgias.  Neurological: Negative for dizziness, weakness, light-headedness and headaches.   Physical Exam Updated Vital Signs BP (!) 186/101 (BP Location: Right Arm)   Pulse 118   Temp 98.4 F (36.9 C) (Oral)   Resp 18   Ht 5\' 6"  (1.676 m)   Wt 85.3 kg   SpO2 98%   BMI 30.34 kg/m   Physical Exam  Constitutional: She is oriented to person, place, and time. She appears well-developed and well-nourished. No distress.  NAD.  HENT:  Head:  Normocephalic and atraumatic.  Right Ear: External ear normal.  Left Ear: External ear normal. Tympanic membrane is erythematous (circumferential erythema).  Nose: Nose normal.  Mouth/Throat: Oropharynx is clear and moist. No oropharyngeal exudate.  Head: No lesions on scalp. Skull and facial bones symmetric, non-tender without bony abnormalities. Frontal and maxillary sinuses are non-tender to percussion. Eyes: Lids symmetrical without lag or palpable mass. Sclera white without prominent vessels. Conjunctiva pink. PERRL bilaterally.  Ears: Both external ears withut lesions, swelling, deformities or tenderness is mastoid areas. R and L external ear auditory canals clear without edema or erythema.  Right TM pearly gray with visible cone of light and bony landmark. Circumferential erythema around left TM without bulging or cloudiness. Nose: Nares patent without discharge. Nasal mucosa pink. No nasal mucosa edema. No sinus tenderness. Septum midline.   Throat: Lips are pink and symmetrical. Dentition normal.  Gingiva, labial and buccal mucosa pink without lesions, tenderness or fluctuance. Oropharynx and tonsils moist with mild erythema, no edema or exudates. Uvula midline. No trismus. Creamy white lesions on posterior tongue and soft palate.   Eyes: Conjunctivae and EOM are normal. Pupils are equal, round, and reactive to light. No scleral icterus.  Neck: Normal range of motion. Neck supple. No JVD present.  Cardiovascular: Normal rate, regular rhythm and normal heart sounds.   No murmur heard. Pulmonary/Chest: Effort normal. She has wheezes. She has no rhonchi. She has no rales.  Diffuse expiratory wheezing in all lung  fields, no rhonchi or rales.   RR within normal limits. SpO2 within normal limits.  Normal breathing effort. Patient speaking in full sentences. No pursed lip breathing. Chest wall expansion symmetric.  No chest wall tenderness. No egophony.   Abdominal: Soft. There is no  tenderness.  Musculoskeletal: Normal range of motion. She exhibits no deformity.  Lymphadenopathy:    She has no cervical adenopathy.  Neurological: She is alert and oriented to person, place, and time.  Skin: Skin is warm and dry. Capillary refill takes less than 2 seconds.  Psychiatric: She has a normal mood and affect. Her behavior is normal. Judgment and thought content normal.  Nursing note and vitals reviewed.    ED Treatments / Results  DIAGNOSTIC STUDIES:  Oxygen Saturation is 100% on RA, normal by my interpretation.    COORDINATION OF CARE:  7:00 PM Discussed treatment plan with pt at bedside and pt agreed to plan.  Labs (all labs ordered are listed, but only abnormal results are displayed) Labs Reviewed - No data to display  EKG  EKG Interpretation None       Radiology No results found.  Procedures Procedures (including critical care time)  Medications Ordered in ED Medications  albuterol (PROVENTIL) (2.5 MG/3ML) 0.083% nebulizer solution 5 mg (5 mg Nebulization Given 04/29/16 1817)  ipratropium-albuterol (DUONEB) 0.5-2.5 (3) MG/3ML nebulizer solution 3 mL (3 mLs Nebulization Given 04/29/16 1931)  predniSONE (DELTASONE) tablet 40 mg (40 mg Oral Given 04/29/16 1940)     Initial Impression / Assessment and Plan / ED Course  I have reviewed the triage vital signs and the nursing notes.  Pertinent labs & imaging results that were available during my care of the patient were reviewed by me and considered in my medical decision making (see chart for details).  Clinical Course as of Apr 30 1032  Mon Apr 29, 2016  1958 Repeat pulmonary exam - much improved breath sounds, faint expiratory wheezing at bilateral lower lobes posteriorly only. Pt is a little jittery, states this often happen from repeat breathing treatment.   [CG]    Clinical Course User Index [CG] Kinnie Feil, PA-C    Patient ambulated in ED with O2 saturations maintained >90, no current  signs of respiratory distress. Lung exam improved after nebulizer treatment. Prednisone given in the ED and pt will bd dc with 5 day burst. Pt states breathing has improved. Pt has been instructed to continue using prescribed medications and to speak with PCP about today's exacerbation.  Pt reported low grade fever which resolved with tylenol.  Given fever, asthma exacerbation, nasal congestion noted on exam pt will be treated with tamiflu as preventative method as she is immunocompromised (h/o breast cancer, asthma).  Will give magic mouth for oral thrush.   Pt is hypertensive, per chart review this is being addressed by Dr. Meda Coffee (cardiology) as she always runs SBP ~180. I suspect elevated SBP today is chronic and multiple breathing tx may also be contributing. No signs of HTN emergency at this time.    Final Clinical Impressions(s) / ED Diagnoses   Final diagnoses:  Mild intermittent asthma with exacerbation  Oral thrush    New Prescriptions Discharge Medication List as of 04/29/2016  7:58 PM    START taking these medications   Details  fluticasone (FLONASE) 50 MCG/ACT nasal spray Place 2 sprays into both nostrils daily., Starting Mon 04/29/2016, Print    loratadine (CLARITIN) 10 MG tablet Take 1 tablet (10 mg total) by mouth daily.,  Starting Mon 04/29/2016, Print    magic mouthwash SOLN Take 5 mLs by mouth daily., Starting Mon 04/29/2016, Print    oseltamivir (TAMIFLU) 75 MG capsule Take 1 capsule (75 mg total) by mouth every 12 (twelve) hours., Starting Mon 04/29/2016, Print    predniSONE (DELTASONE) 10 MG tablet Take 4 tablets (40 mg total) by mouth daily., Starting Mon 04/29/2016, Print       I personally performed the services described in this documentation, which was scribed in my presence. The recorded information has been reviewed and is accurate.     Kinnie Feil, PA-C 04/30/16 [place], MD 04/30/16 [phone]

## 2016-04-29 NOTE — Progress Notes (Signed)
Note routed to both.  Will schedule patient for a follow up visit.

## 2016-04-29 NOTE — Addendum Note (Signed)
Addended by: Doroteo Glassman D on: 04/29/2016 01:49 PM   Modules accepted: Orders

## 2016-04-29 NOTE — Patient Instructions (Addendum)
1.  Increase topirmate to 50mg  in the morning and 100mg  at bedtime 2.  You can try over the counter lidocaine ointments such as Aspercream 3.  Referral will be sent to Pain Management for options 4.  Please call [facility] to be re-evaluated  Return to clinic as needed

## 2016-04-29 NOTE — Discharge Instructions (Signed)
You have been prescribed Tamiflu as a preventative measure in case your asthma exacerbation was caused by influenza.    Please take claritin and flonase for nasal congestion and allergies.  Allergies may also spark an asthma attack.   Please use magic mouth once daily for oral thrush.   Return immediately if you develop shortness of breath associated with fever, increase cough or phlegm as this may warrant more work up (chest x-ray, antibiotics).    Given your suppressed immune status please follow up with your primary care provider in 3-5 days to ensure your breathing is improving and your asthma is back to baseline

## 2016-04-29 NOTE — Progress Notes (Signed)
Follow-up Visit   Date: 04/29/16    Erica [name] MRN: [id number] DOB: 10/28/1975   Interim History: Erica [name] is a 41 y.o. 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    History of present illness: Starting in 2010 after her second course of chemotherapy she developed numbness/tingling of the arm and legs, described as her hands are asleep. She has burning sensation and shaking of her hands. Activity worsens pain and she has difficulty with fine motor tasks such as buttoning, tying shoe laces, and braiding her daughter's hair. She falls about once per month because her legs give out. She was started on Lyrica 50mg  TID which has been titrated to 200mg  TID.  She history of refractory pseudotumor cerebri and has been on diamox, topirmate, and lasix.  Additionally, she continues to have 1-2 elective high volume taps annually for headaches.  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   She reports having spells of syncope, which seems to only occur when she is sitting. There is loss of consciousness, lasting only a few seconds.  Following the spells, there is no fatigue, confusion, or weakness. On one ocassion, she felt herself leaning to the side and caught herself when at therapy. She is unaware of the spells.  Routine EEG was normal.  MRI/V brain has been normal.   In 2016, her ophthalmologist, Dr. Vista Mink noticed new findings of severe left temporal visual field deficit involving her left eye (good eye). Her imaging did not disclose any intracranial  abnormality to account for symptoms.  She underwent LP with OP of 27cm of water.  Since her LP, she reports feeling as if her vision is better and eyes don't feel as foggy anymore.   She was evaluated by neurosurgery for shunt evaluation, but because of no papilledema, did not feel that she would be a good candidate.   ] She was seen by Dr. by Darrin Luis, opthalmology at [facility] in early 2017, who noted severe nerve fiber layer loss stating that papilledema is not a good marker for elevated ICP because she has such few nerve fibers.   - UPDATE 10/26/2015:   In July 2017, she sought opinion of Dr. Theda Sers at [facility] who recommended shunting as the only option to control headaches and preserve vision, but remains undecided on this.  She is leaning more and more towards VP shunt as it was explained that her vision, which is already impaired, needs to be protected.  Last week, she had an interval lumbar puncture for worsening headaches, which helped.  There have been no new vision problems. She continues to be on diamox 1000mg  TID and topiramate 50mg  BID.  She complaints of worsening tremors and bilateral leg pain.  She also has a lot of mylagias and polyarthralgias.  - UPDATE 04/29/2016:  In October, she had VP shunt placed at [facility] but has not noticed any improvement with her headaches.  In fact, she complains of worsening headaches, especially when coughing or sneezing, giving a band-like pressure over the forehead.  She also complains of vomiting and coughing since the shunt was placed.  CT brain in Dec 2017 shows  stable shunt placement.  Because of inclement weather, her follow-up with opthalmology was cancelled.  She is on Diamox 1000mg  TID and topirmate 50mg  BID.   She also has worsening painful neuropathy of the hands and feet.  She takes Lyrica 200mg  TID and Cymbalta 60mg  daily.   Her tremors are better in her right hand, but left hand continues to shake often.  She takes primidone 25mg  daily. Of  note, she was started on Ranexa for chest pain and hypertension.   Medications:  Current Outpatient Prescriptions on File Prior to Visit  Medication Sig Dispense Refill  . acetaminophen-codeine (TYLENOL #4) 300-60 MG tablet Take 1 tablet by mouth every 4 (four) hours as needed for moderate pain (or cough). 30 tablet 0  . acetaZOLAMIDE (DIAMOX) 250 MG tablet Take 4 tablets (1,000 mg total) by mouth 3 (three) times daily. 400 tablet 11  . albuterol (PROVENTIL) (2.5 MG/3ML) 0.083% nebulizer solution USE 1 VIAL VIA NEBULIZER EVERY 4 HOURS AS NEEDED( NEED APPOINTMENT) 300 mL 2  . beclomethasone (QVAR) 80 MCG/ACT inhaler Inhale 2 puffs into the lungs 2 (two) times daily. 1 Inhaler 5  . budesonide-formoterol (SYMBICORT) 160-4.5 MCG/ACT inhaler Inhale 2 puffs into the lungs 2 (two) times daily. 10.2 g 5  . chlorthalidone (HYGROTON) 25 MG tablet Take 1 tablet (25 mg total) by mouth daily. 90 tablet 2  . cloNIDine (CATAPRES) 0.3 MG tablet TAKE 1 TABLET BY MOUTH TWICE DAILY 180 tablet 3  . dexlansoprazole (DEXILANT) 60 MG capsule Take 1 capsule (60 mg total) by mouth daily. 30 capsule 5  . dicyclomine (BENTYL) 20 MG tablet Take 1 tablet (20 mg total) by mouth 3 (three) times daily as needed for spasms. 90 tablet 2  . diltiazem (CARDIZEM LA) 420 MG 24 hr tablet Take 1 tablet (420 mg total) by mouth daily. 90 tablet 3  . DULoxetine (CYMBALTA) 60 MG capsule Take 1 capsule (60 mg total) by mouth daily. 90 capsule 3  . EPINEPHrine 0.3 mg/0.3 mL IJ SOAJ injection Inject 0.3 mLs (0.3 mg total) into the muscle once as needed (anaphylaxis reaction). 2 Device 5  . Fluocinonide Emulsified Base 0.05 % CREA APPLY EXTERNALLY TO THE AFFECTED AREA TWICE DAILY 60 g 1  . hydrALAZINE (APRESOLINE) 100 MG tablet TAKE 1 TABLET(100 MG) BY MOUTH THREE TIMES DAILY 90 tablet 0  . lidocaine (XYLOCAINE) 5 % ointment Apply 1 application topically 2 (two) times daily as needed. 50 g 5  . metoCLOPramide (REGLAN) 10 MG tablet TAKE 1  TABLET BY MOUTH FOUR TIMES DAILY AT BEDTIME WITH MEALS 120 tablet 5  . mometasone (NASONEX) 50 MCG/ACT nasal spray Place 2 sprays into the nose 2 (two) times daily. 17 g 5  . montelukast (SINGULAIR) 10 MG tablet Take 1 tablet (10 mg total) by mouth at bedtime. 30 tablet 5  . Nebivolol HCl (BYSTOLIC) 20 MG TABS 2 tabs by mouth per day 180 tablet 3  . NITROSTAT 0.4 MG SL tablet PLACE 1 TABLET UNDER TONGUE EVERY 5 MINUTES AS NEEDED FOR CHEST PAIN, MAX OF 3 TABLETS THEN CALL 911 IF PAIN PERSISTS 100 tablet 0  . OXYGEN Use CPAP at bedtime    . potassium chloride SA (K-DUR,KLOR-CON) 20 MEQ tablet Take 2 tablets (40 mEq total) by mouth daily. (Patient taking differently: Take 40 mEq by mouth 2 (two) times daily. ) 180 tablet 2  . primidone (MYSOLINE) 50 MG tablet Take 50 mg by mouth 2 (two) times daily.     . promethazine (PHENERGAN)  12.5 MG tablet Take 1-2 tablets by mouth every 8 hours as needed. Beware of sedation 50 tablet 2  . ranolazine (RANEXA) 500 MG 12 hr tablet Take 1 tablet (500 mg total) by mouth 2 (two) times daily. 180 tablet 1  . Respiratory Therapy Supplies (FLUTTER) DEVI Use as directed 1 each 0  . spironolactone (ALDACTONE) 50 MG tablet Take 1 tablet (50 mg total) by mouth daily. 90 tablet 2  . traMADol (ULTRAM) 50 MG tablet Take 1 tablet (50 mg total) by mouth every 6 (six) hours as needed. 60 tablet 1  . traZODone (DESYREL) 150 MG tablet Take 50 mg by mouth at bedtime.   1  . valsartan (DIOVAN) 160 MG tablet Take 2 tablets (320 mg total) by mouth daily. 90 tablet 2  . VENTOLIN HFA 108 (90 Base) MCG/ACT inhaler INHALE 2 PUFFS INTO THE LUNGS EVERY 4 HOURS AS NEEDED FOR WHEEZING OR SHORTNESS OF BREATH 18 g 5   No current facility-administered medications on file prior to visit.     Allergies:  Allergies  Allergen Reactions  . Aspirin Shortness Of Breath and Palpitations    Pt can take ibuprofen without reaction  . Bactrim [Sulfamethoxazole-Trimethoprim] Anaphylaxis  . Doxycycline  Anaphylaxis  . Penicillins Shortness Of Breath and Palpitations    Has patient had a PCN reaction causing immediate rash, facial/tongue/throat swelling, SOB or lightheadedness with hypotension: Yes Has patient had a PCN reaction causing severe rash involving mucus membranes or skin necrosis: Yes Has patient had a PCN reaction that required hospitalization Yes Has patient had a PCN reaction occurring within the last 10 years: No If all of the above answers are "NO", then may proceed with Cephalosporin use.  Severiano Gilbert Extract Itching and Swelling    Lip swelling  . Strawberry Extract Hives     Review of Systems:  CONSTITUTIONAL: No fevers, chills, night sweats, or weight loss.   EYES: +visual changes or eye pain ENT: No hearing changes.  No history of nose bleeds.   RESPIRATORY: +cough, wheezing and shortness of breath.   CARDIOVASCULAR: Negative for chest pain, and palpitations.   GI: Negative for abdominal discomfort, blood in stools or black stools.  No recent change in bowel habits.   GU:  No history of incontinence.   MUSCLOSKELETAL: No history of joint pain or swelling.  +myalgias.   SKIN: Negative for lesions, rash, and itching.   ENDOCRINE: Negative for cold or heat intolerance, polydipsia or goiter.   PSYCH:  +depression or anxiety symptoms.   NEURO: As Above.   Vital Signs:  BP (!) 170/88   Pulse (!) 112   Ht 5\' 6"  (1.676 m)   Wt 188 lb 4 oz (85.4 kg)   SpO2 94%   BMI 30.38 kg/m   Neurological Exam: MENTAL STATUS including orientation to time, place, person is normal.  Tearful during the visit today.   CRANIAL NERVES:  She is blind in the right eye - only perceives light.  Visual field testing of the left eye is shows reduced peripheral vision on the left temporal field.  Pupils equal round and reactive to light.  Face is symmetric.   MOTOR: Moderate intention tremor on the left only. No pronator drift.  Tone is normal.   Motor strength is 5/5 throughout except L  dorsiflexion and toe extension is 5-/5  MSRs: Reflexes are 2+/4 throughout.  SENSORY:  Intact to vibration and temperature throughout  COORDINATION/GAIT:   Gait narrow based, slow, slightly unsteady, unassisted.  Data: [facility], Dr. 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  MRI brain wwo contrast 05/18/2015: Stable MRI appearance of the brain since 2016 with moderate for age nonspecific white matter signal changes. No acute intracranial abnormality.  MRV 06/30/2015:  No evidence of venous sinus thrombosis. No focal narrowing of the transverse-sigmoid sinus junction, as can be seen in the setting of idiopathic intracranial hypotension.  MRI cervical spine wo contrast 06/07/2015: 1. Small right paracentral/foraminal disc protrusion at T2-3, closely approximating the exiting right T2 nerve root. This could potentially produce right-sided radicular symptoms. 2. Mild right foraminal narrowing at C4-5 and C5-6 related to uncovertebral hypertrophy. No other significant right-sided foraminal narrowing within the cervical spine. 3. Small central disc protrusion at C6-7 without significant stenosis. 4. Additional tiny central disc protrusion at C2-3 without stenosis. 5. Additional minimal multilevel degenerative changes as above.  CSF 09/18/2011: R269 W0 G65 P17, CSF cytology-neg OP 43, 32cc removed  LP 02/05/2013: OP 18, CP 17 after 21 cc removed  LP 10/25/2013:  OP 26, CP 5 after 15 cc removed LP 07/29/2014:  OP 27, CP 16 after 18cc removed LP 02/07/2015:  OP 27, CP 12 after 22cc removed LP 05/23/2015:  OP 25, CP 11 after 25cc removed LP 10/19/2015:  OP 26.5, CP 10 after 32cc removed  Routine EEG 06/28/2014:  Normal  CT head 03/04/2016:  No acute intracranial abnormalities. Stable appearance of the shunted ventricular system.  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  Clinically worsening. Continue Lyrica to 200mg  TID  Continue Cymbalta 60mg  which she takes neuropathy Unable to use TCA due to borderline QTc and cardiac history Start OTC lidocaine ointment such as AsperCream or Salonpas, since prescription lidocaine was not covered in her current plan Referral to Pain Management for other options  Benign essential tremors, stable Continue primidone to 25mg  twice daily.  Cautious with titrating primidone as it reduces the effective dose of Ranexa Unable to use beta blockers with her history of asthma   Return to clinic in 3 months   The duration of this appointment visit was 30 minutes of face-to-face time with the patient.  Greater than 50% of this time was spent in counseling, explanation of diagnosis, planning of further management, and coordination of care.   Thank you for allowing me to participate in patient's care.  If I can answer any additional questions, I would be pleased to do so.    Sincerely,    Donika K. Posey Pronto, DO

## 2016-04-29 NOTE — ED Triage Notes (Signed)
Patient is complaining of an asthma attack starting yesterday but getting progressively worse in the last couple of hours. Patient is conscious, alert, oriented, and speaking in full sentences in triage.

## 2016-04-29 NOTE — ED Notes (Signed)
Patient was alert, oriented and stable upon discharge. RN went over AVS and patient had no further questions.  

## 2016-05-02 ENCOUNTER — Encounter: Payer: Self-pay | Admitting: *Deleted

## 2016-05-03 IMAGING — XA DG FLUORO GUIDE LUMBAR PUNCTURE
1 series · 1 of 1 positions shown · non-contrast
Comparison: none

CLINICAL DATA: Pseudotumor cerebri. Headache. Visual field defect.

[Series 3: ortho standard · 1 of 1 slices shown]
[im 1/1]
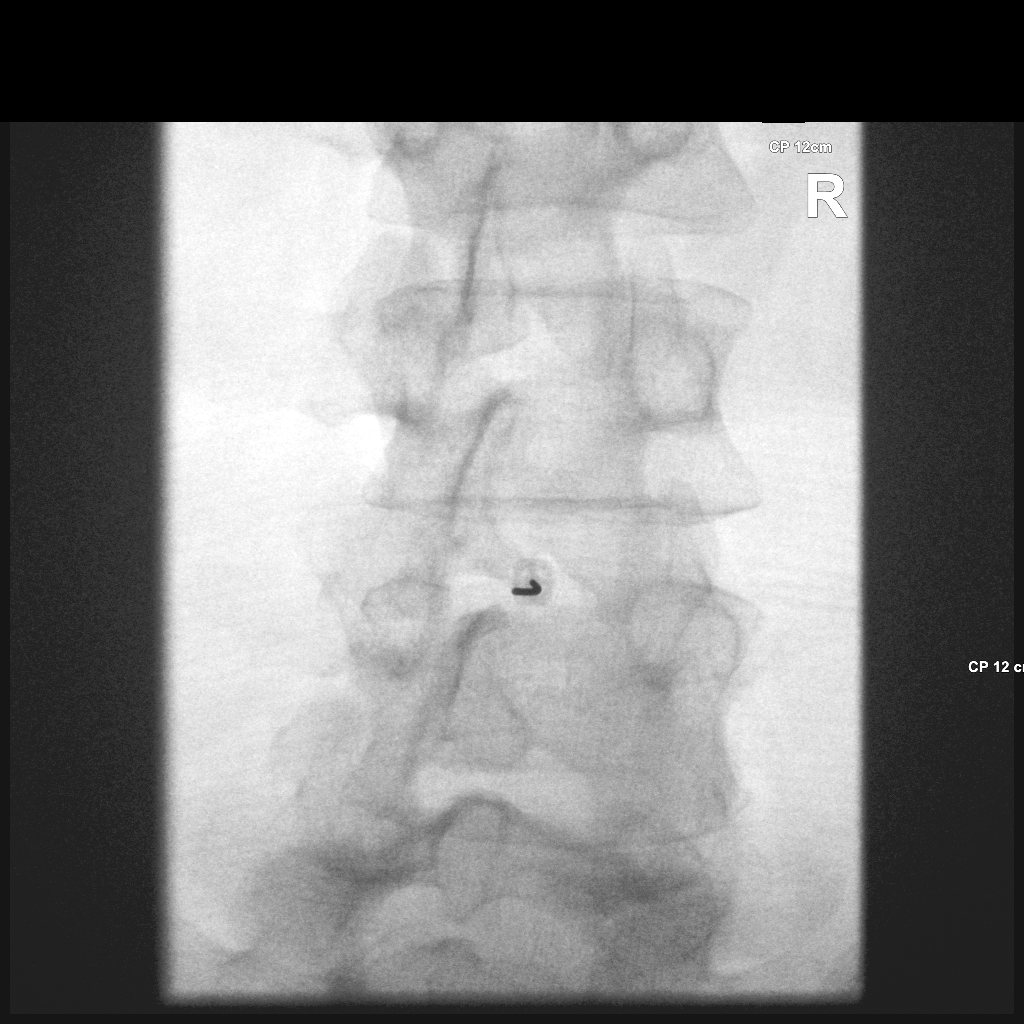

[1 of 1 positions shown; findings below may reference images not displayed]

EXAM:
DIAGNOSTIC LUMBAR PUNCTURE UNDER FLUOROSCOPIC GUIDANCE

FLUOROSCOPY TIME:  Radiation Exposure Index (as provided by the
fluoroscopic device): 61.08 microGray*m^2

Fluoroscopy Time (in minutes and seconds):  0 minutes 11 seconds

PROCEDURE:
Informed consent was obtained from the patient prior to the
procedure, including potential complications of headache, allergy,
and pain. With the patient prone, the lower back was prepped with
Betadine. 1% Lidocaine was used for local anesthesia. Lumbar
puncture was performed at the L3-4 level using a 6 inch, 20 gauge
needle with return of clear CSF with an opening pressure of 27 cm
water. 22 ml of CSF were obtained for laboratory studies. Closing
pressure was 12 cm water. The patient tolerated the procedure well
and there were no apparent complications.
IMPRESSION: Successful fluoroscopic guided lumbar puncture. Opening pressure 27
cm water. Closing pressure 12 cm water.

## 2016-05-05 ENCOUNTER — Other Ambulatory Visit: Payer: Self-pay | Admitting: Internal Medicine

## 2016-05-09 ENCOUNTER — Other Ambulatory Visit: Payer: Medicare Other

## 2016-05-09 ENCOUNTER — Ambulatory Visit: Payer: Medicare Other

## 2016-05-13 ENCOUNTER — Other Ambulatory Visit: Payer: Medicare Other | Admitting: *Deleted

## 2016-05-13 ENCOUNTER — Ambulatory Visit (INDEPENDENT_AMBULATORY_CARE_PROVIDER_SITE_OTHER): Payer: Medicare Other | Admitting: Pharmacist

## 2016-05-13 VITALS — BP 186/112 | HR 98 | Wt 190.5 lb

## 2016-05-13 DIAGNOSIS — I10 Essential (primary) hypertension: Secondary | ICD-10-CM

## 2016-05-13 DIAGNOSIS — I119 Hypertensive heart disease without heart failure: Secondary | ICD-10-CM

## 2016-05-13 NOTE — Patient Instructions (Signed)
Return for a follow up appointment in 2 weeks and lab work in 1 week  Check your blood pressure at home daily (if able) and keep record of the readings.  Take your BP meds as follows: INCREASE spironolactone to 100mg  daily - pending lab results from today - I will call you. Continue all other medications as prescribed.   Bring all of your meds, your BP cuff and your record of home blood pressures to your next appointment.  Exercise as you're able, try to walk approximately 30 minutes per day.  Keep salt intake to a minimum, especially watch canned and prepared boxed foods.  Eat more fresh fruits and vegetables and fewer canned items.  Avoid eating in fast food restaurants.    HOW TO TAKE YOUR BLOOD PRESSURE: . Rest 5 minutes before taking your blood pressure. .  Don't smoke or drink caffeinated beverages for at least 30 minutes before. . Take your blood pressure before (not after) you eat. . Sit comfortably with your back supported and both feet on the floor (don't cross your legs). . Elevate your arm to heart level on a table or a desk. . Use the proper sized cuff. It should fit smoothly and snugly around your bare upper arm. There should be enough room to slip a fingertip under the cuff. The bottom edge of the cuff should be 1 inch above the crease of the elbow. . Ideally, take 3 measurements at one sitting and record the average.

## 2016-05-13 NOTE — Progress Notes (Signed)
Patient ID: Erica [name]                 DOB: 12-25-1975                      MRN: [id number]     HPI: Erica [name] is a 41 y.o. female patient of Dr. Meda [name] with PMH below who presents today for hypertension follow up. She also has history of recurrent syncope. The cardio monitor showed only sinus rhythm. 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 Sleep study showed severe sleep apnea but she is noncompliant with CPap machine - per patient she is considered noncompliant because she never sleeps more than 4 hours at time. 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 She has had a renal artery scan on 10/13/15 that was negative. She also had a normal TSH in March of 2017.   She presents today again stating compliance with medications. She reports no issues with increase dose of valsartan.    Cardiac Hx: CHF, HTN, idiopathic intracranial hypertension, asthma, sleep apnea, obesity, hypertriglyceridemia  Current HTN meds:  Valsartan 320mg  daily in the morning Spironolactone 50mg  daily in the morning Chlorthalidone 25mg  daily in the morning Nebivolol 40mg  daily in the morning Hydralazine 100mg  TID Dilitiazem 420mg  daily in the morning Clonidine 0.3mg  BID  Previously tried:  Amlodipine - did not work to control pressure Imdur - worsening headaches  BP goal: <130/90  Family History: She states that none of her close family members have known hypertension or other cardiac issues.   Social History: She denies tobacco and endorses only rare social alcohol consumption.   Diet: She eats most of her meals from home and prepares them without salt. She uses pepper and garlic to season. She does endorse 2-3 Pepsis per day and states that without these she becomes increasingly nauseated and the caffeine helps with her headaches.   Exercise: She tries to walk though she can barely make it 10 minutes without getting SOB. She  states she tried P90x once with her daughters and had to use a breathing treatment.   Home BP readings:  Her home cuff recently broke  She reports her pressures have been [id number] systolic when she has been able to check.   Wt Readings from Last 3 Encounters:  05/13/16 190 lb 8 oz (86.4 kg)  04/29/16 188 lb (85.3 kg)  04/29/16 188 lb 4 oz (85.4 kg)   BP Readings from Last 3 Encounters:  05/13/16 (!) 186/112  04/29/16 (!) 186/101  04/29/16 (!) 170/88   Pulse Readings from Last 3 Encounters:  05/13/16 98  04/29/16 118  04/29/16 (!) 112    Renal function: CrCl cannot be calculated (Patient's most recent lab result is older than the maximum 21 days allowed.).  Past Medical History:  Diagnosis Date  . Anemia, unspecified 04/13/2011  . Asthma   . Asthma 09/27/2010  . Bloating 03/18/2011  . Breast cancer ([place]) 2010   a. locally advanced left breast carcinoma diagnosed in 2010 and treated with neoadjuvant chemotherapy with docetaxel and cyclophosphamide as well as paclitaxel. This was followed by radiation therapy which was completed in 2011.  Marland Kitchen Dyspnea on exertion    a. 01/2013 Lexi MV: EF 54%, no ischemia/infarct;  b. 05/2013 Echo: EF 60-65%, no rwma, Gr 2 DD;  b.   . Fatty liver   . Gastroparesis   . GERD (gastroesophageal reflux disease)   .  Hypertension   . Impaired glucose tolerance 04/13/2011  . Lymphadenitis, chronic    restricted LEFT extremity  . 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 OSA (obstructive sleep apnea) 05/04/2014   AHI 31/hr now on CPAP at 12cm H2O    Current Outpatient Prescriptions on File Prior to Visit  Medication Sig Dispense Refill  . acetaminophen-codeine (TYLENOL #4) 300-60 MG tablet Take 1 tablet by mouth every 4 (four) hours as needed for moderate pain (or cough). 30 tablet 0  . acetaZOLAMIDE (DIAMOX) 250 MG tablet Take 4 tablets (1,000 mg total) by mouth 3 (three) times daily. 400 tablet 11  . albuterol (PROVENTIL) (2.5 MG/3ML) 0.083% nebulizer  solution USE 1 VIAL VIA NEBULIZER EVERY 4 HOURS AS NEEDED( NEED APPOINTMENT) 300 mL 2  . amitriptyline (ELAVIL) 100 MG tablet Take 100 mg by mouth at bedtime.    Marland Kitchen amLODipine (NORVASC) 10 MG tablet Take 10 mg by mouth daily.    . beclomethasone (QVAR) 80 MCG/ACT inhaler Inhale 2 puffs into the lungs 2 (two) times daily. 1 Inhaler 5  . Biotin 10000 MCG TABS Take 1 tablet by mouth daily.    . budesonide-formoterol (SYMBICORT) 160-4.5 MCG/ACT inhaler Inhale 2 puffs into the lungs 2 (two) times daily. 10.2 g 5  . chlorpheniramine (CHLOR-TRIMETON) 4 MG tablet Take 8 mg by mouth at bedtime.    . chlorthalidone (HYGROTON) 25 MG tablet Take 1 tablet (25 mg total) by mouth daily. 90 tablet 2  . cloNIDine (CATAPRES) 0.3 MG tablet TAKE 1 TABLET BY MOUTH TWICE DAILY 180 tablet 3  . dexlansoprazole (DEXILANT) 60 MG capsule Take 1 capsule (60 mg total) by mouth daily. 30 capsule 5  . dextromethorphan-guaiFENesin (MUCINEX DM) 30-600 MG 12hr tablet Take 1 tablet by mouth 2 (two) times daily as needed for cough.    . dicyclomine (BENTYL) 20 MG tablet TAKE 1 TABLET BY MOUTH 3 TIMES DAILY AS NEEDED FOR SPASMS 90 tablet 3  . diltiazem (CARDIZEM LA) 420 MG 24 hr tablet Take 1 tablet (420 mg total) by mouth daily. 90 tablet 3  . DULoxetine (CYMBALTA) 60 MG capsule Take 1 capsule (60 mg total) by mouth daily. 90 capsule 3  . fluticasone (FLONASE) 50 MCG/ACT nasal spray Place 2 sprays into both nostrils daily. 16 g 0  . furosemide (LASIX) 20 MG tablet Take 20 mg by mouth daily as needed.    . hydrALAZINE (APRESOLINE) 100 MG tablet TAKE 1 TABLET(100 MG) BY MOUTH THREE TIMES DAILY 90 tablet 0  . hydrochlorothiazide (MICROZIDE) 12.5 MG capsule Take 12.5 mg by mouth daily.    Marland Kitchen loratadine (CLARITIN) 10 MG tablet Take 1 tablet (10 mg total) by mouth daily. 30 tablet 0  . LORazepam (ATIVAN) 0.5 MG tablet Take 0.5 mg by mouth every 8 (eight) hours as needed for anxiety.    . metoCLOPramide (REGLAN) 10 MG tablet TAKE 1 TABLET  BY MOUTH FOUR TIMES DAILY AT BEDTIME WITH MEALS 120 tablet 5  . mometasone (NASONEX) 50 MCG/ACT nasal spray Place 2 sprays into the nose 2 (two) times daily. 17 g 5  . montelukast (SINGULAIR) 10 MG tablet Take 1 tablet (10 mg total) by mouth at bedtime. 30 tablet 5  . Nebivolol HCl (BYSTOLIC) 20 MG TABS 2 tabs by mouth per day (Patient taking differently: 1 tab by mouth per day) 180 tablet 3  . NITROSTAT 0.4 MG SL tablet PLACE 1 TABLET UNDER TONGUE EVERY 5 MINUTES AS NEEDED FOR CHEST PAIN, MAX OF 3 TABLETS  THEN CALL 911 IF PAIN PERSISTS 100 tablet 0  . ondansetron (ZOFRAN) 4 MG tablet Take 4 mg by mouth every 6 (six) hours as needed for nausea or vomiting.    . OXYGEN Use CPAP at bedtime    . potassium chloride SA (K-DUR,KLOR-CON) 20 MEQ tablet Take 2 tablets (40 mEq total) by mouth daily. (Patient taking differently: Take 20 mEq by mouth 2 (two) times daily. ) 180 tablet 2  . pregabalin (LYRICA) 200 MG capsule Take 200 mg by mouth 3 (three) times daily.    . primidone (MYSOLINE) 50 MG tablet Take 1/2 tab daily    . promethazine (PHENERGAN) 12.5 MG tablet Take 1-2 tablets by mouth every 8 hours as needed. Beware of sedation 50 tablet 2  . ranolazine (RANEXA) 500 MG 12 hr tablet Take 1 tablet (500 mg total) by mouth 2 (two) times daily. 180 tablet 1  . Respiratory Therapy Supplies (FLUTTER) DEVI Use as directed 1 each 0  . sodium chloride (OCEAN) 0.65 % SOLN nasal spray Place 1 spray into both nostrils as needed for congestion.    Marland Kitchen spironolactone (ALDACTONE) 50 MG tablet Take 1 tablet (50 mg total) by mouth daily. 90 tablet 2  . topiramate (TOPAMAX) 50 MG tablet Take 1 tablet in the monring and 2 at bedtime. (Patient taking differently: 25 mg 2 (two) times daily. Take 1 tablet in the monring and 2 at bedtime.) 270 tablet 3  . traZODone (DESYREL) 150 MG tablet Take 150 mg by mouth at bedtime.   1  . valsartan (DIOVAN) 160 MG tablet Take 2 tablets (320 mg total) by mouth daily. (Patient taking  differently: Take 160 mg by mouth daily. ) 90 tablet 2  . VENTOLIN HFA 108 (90 Base) MCG/ACT inhaler INHALE 2 PUFFS INTO THE LUNGS EVERY 4 HOURS AS NEEDED FOR WHEEZING OR SHORTNESS OF BREATH 18 g 5  . EPINEPHrine 0.3 mg/0.3 mL IJ SOAJ injection Inject 0.3 mLs (0.3 mg total) into the muscle once as needed (anaphylaxis reaction). (Patient not taking: Reported on 05/13/2016) 2 Device 5   No current facility-administered medications on file prior to visit.     Allergies  Allergen Reactions  . Aspirin Shortness Of Breath and Palpitations    Pt can take ibuprofen without reaction  . Bactrim [Sulfamethoxazole-Trimethoprim] Anaphylaxis  . Doxycycline Anaphylaxis  . Penicillins Shortness Of Breath and Palpitations    Has patient had a PCN reaction causing immediate rash, facial/tongue/throat swelling, SOB or lightheadedness with hypotension: Yes Has patient had a PCN reaction causing severe rash involving mucus membranes or skin necrosis: Yes Has patient had a PCN reaction that required hospitalization Yes Has patient had a PCN reaction occurring within the last 10 years: No If all of the above answers are "NO", then may proceed with Cephalosporin use.  Severiano Gilbert Extract Itching and Swelling    Lip swelling  . Strawberry Extract Hives    Blood pressure (!) 186/112, pulse 98, weight 190 lb 8 oz (86.4 kg), SpO2 99 %.   Assessment/Plan: Hypertension: BMET returned WNL - potassium on low end of normal. BP essentially unchanged from previous with increased dose of valsartan. Will increase spironolactone to 100mg  daily. Continue all other medications as prescribed. Repeat BMET in 1 week and follow up in hypertension clinic in 2 weeks.   Unable to reach patient with number provided - number rings as unavailable. Attempt X2 on 05/14/16. Attempt X2 on 05/15/16.    Thank you, Lelan Pons. Patterson Hammersmith, PharmD  [facility]  05/13/2016 2:33 PM

## 2016-05-14 ENCOUNTER — Encounter: Payer: Self-pay | Admitting: Pharmacist

## 2016-05-14 LAB — BASIC METABOLIC PANEL
BUN/Creatinine Ratio: 13 (ref 9–23)
BUN: 13 mg/dL (ref 6–24)
CO2: 25 mmol/L (ref 18–29)
Calcium: 9.2 mg/dL (ref 8.7–10.2)
Chloride: 104 mmol/L (ref 96–106)
Creatinine, Ser: 0.97 mg/dL (ref 0.57–1.00)
GFR calc Af Amer: 83 mL/min/{1.73_m2} (ref >59)
GFR calc non Af Amer: 72 mL/min/{1.73_m2} (ref >59)
Glucose: 95 mg/dL (ref 65–99)
Potassium: 3.5 mmol/L (ref 3.5–5.2)
Sodium: 144 mmol/L (ref 134–144)

## 2016-05-14 MED ORDER — SPIRONOLACTONE 100 MG PO TABS: 100.0000 mg | ORAL_TABLET | Freq: Every day | ORAL | 1 refills | Status: DC

## 2016-05-27 ENCOUNTER — Ambulatory Visit (INDEPENDENT_AMBULATORY_CARE_PROVIDER_SITE_OTHER): Payer: Medicare Other | Admitting: Pharmacist

## 2016-05-27 VITALS — BP 192/118 | HR 105

## 2016-05-27 DIAGNOSIS — I119 Hypertensive heart disease without heart failure: Secondary | ICD-10-CM | POA: Diagnosis not present

## 2016-05-27 DIAGNOSIS — I10 Essential (primary) hypertension: Secondary | ICD-10-CM

## 2016-05-27 NOTE — Progress Notes (Signed)
Patient ID: Erica [name]                 DOB: February 06, 1976                      MRN: [id number]     HPI: Erica [name] is a 41 y.o. female patient of Dr.  Meda Coffee with PMH below who presents today for hypertension follow up. She also has history of recurrent syncope. The cardio monitor showed only sinus rhythm. 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 Sleep studyshowed severe sleep apnea but she is noncompliant with CPap machine - per patient she is considered noncompliant because she never sleeps more than 4 hours at time. 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 She has had a renal artery scan on 10/13/15 that was negative. She also had a normal TSH in March of 2017.   At her most recent OV she was instructed to increase her spironolactone to 100mg  daily. She did not do this as she was unsure of her lab results as we were unable to reach her. She states that she lost her phone.   Still has daily headaches.   Cardiac Hx: CHF, HTN, idiopathic intracranial hypertension, asthma, sleep apnea, obesity, hypertriglyceridemia  Current HTN meds:  Valsartan 320mg  daily in the morning Spironolactone 50mg  daily in the morning Chlorthalidone 25mg  daily in the morning Nebivolol 40mg  daily in the morning Hydralazine 100mg  TID Dilitiazem 420mg  daily in the morning Clonidine 0.3mg  BID  Previously tried: Amlodipine - did not work to control pressure Imdur - worsening headaches  BP goal: <130/90  Family History: She states that none of her close family members have known hypertension or other cardiac issues.   Social History: She denies tobacco and endorses only rare social alcohol consumption.   Diet:She eats most of her meals from home and prepares them without salt. She uses pepper and garlic to season. She does endorse 2-3 Pepsis per day and states that without these she becomes increasingly nauseated and the caffeine helps  with her headaches.   Exercise:She tries to walk though she can barely make it 10 minutes without getting SOB. She states she tried P90x once with her daughters and had to use a breathing treatment.   Home BP readings: has not gotten new machine  Wt Readings from Last 3 Encounters:  05/13/16 190 lb 8 oz (86.4 kg)  04/29/16 188 lb (85.3 kg)  04/29/16 188 lb 4 oz (85.4 kg)   BP Readings from Last 3 Encounters:  05/27/16 (!) 192/118  05/13/16 (!) 186/112  04/29/16 (!) 186/101   Pulse Readings from Last 3 Encounters:  05/27/16 (!) 105  05/13/16 98  04/29/16 118    Renal function: CrCl cannot be calculated (Unknown ideal weight.).  Past Medical History:  Diagnosis Date  . Anemia, unspecified 04/13/2011  . Asthma   . Asthma 09/27/2010  . Bloating 03/18/2011  . Breast cancer ([place]) 2010   a. locally advanced left breast carcinoma diagnosed in 2010 and treated with neoadjuvant chemotherapy with docetaxel and cyclophosphamide as well as paclitaxel. This was followed by radiation therapy which was completed in 2011.  Marland Kitchen Dyspnea on exertion    a. 01/2013 Lexi MV: EF 54%, no ischemia/infarct;  b. 05/2013 Echo: EF 60-65%, no rwma, Gr 2 DD;  b.   . Fatty liver   . Gastroparesis   . GERD (gastroesophageal reflux disease)   . Hypertension   .  Impaired glucose tolerance 04/13/2011  . Lymphadenitis, chronic    restricted LEFT extremity  . 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 OSA (obstructive sleep apnea) 05/04/2014   AHI 31/hr now on CPAP at 12cm H2O    Current Outpatient Prescriptions on File Prior to Visit  Medication Sig Dispense Refill  . acetaminophen-codeine (TYLENOL #4) 300-60 MG tablet Take 1 tablet by mouth every 4 (four) hours as needed for moderate pain (or cough). 30 tablet 0  . acetaZOLAMIDE (DIAMOX) 250 MG tablet Take 4 tablets (1,000 mg total) by mouth 3 (three) times daily. 400 tablet 11  . albuterol (PROVENTIL) (2.5 MG/3ML) 0.083% nebulizer solution USE 1 VIAL VIA  NEBULIZER EVERY 4 HOURS AS NEEDED( NEED APPOINTMENT) 300 mL 2  . amitriptyline (ELAVIL) 100 MG tablet Take 100 mg by mouth at bedtime.    Marland Kitchen amLODipine (NORVASC) 10 MG tablet Take 10 mg by mouth daily.    . beclomethasone (QVAR) 80 MCG/ACT inhaler Inhale 2 puffs into the lungs 2 (two) times daily. 1 Inhaler 5  . Biotin 10000 MCG TABS Take 1 tablet by mouth daily.    . budesonide-formoterol (SYMBICORT) 160-4.5 MCG/ACT inhaler Inhale 2 puffs into the lungs 2 (two) times daily. 10.2 g 5  . chlorpheniramine (CHLOR-TRIMETON) 4 MG tablet Take 8 mg by mouth at bedtime.    . chlorthalidone (HYGROTON) 25 MG tablet Take 1 tablet (25 mg total) by mouth daily. 90 tablet 2  . cloNIDine (CATAPRES) 0.3 MG tablet TAKE 1 TABLET BY MOUTH TWICE DAILY 180 tablet 3  . dexlansoprazole (DEXILANT) 60 MG capsule Take 1 capsule (60 mg total) by mouth daily. 30 capsule 5  . dextromethorphan-guaiFENesin (MUCINEX DM) 30-600 MG 12hr tablet Take 1 tablet by mouth 2 (two) times daily as needed for cough.    . dicyclomine (BENTYL) 20 MG tablet TAKE 1 TABLET BY MOUTH 3 TIMES DAILY AS NEEDED FOR SPASMS 90 tablet 3  . diltiazem (CARDIZEM LA) 420 MG 24 hr tablet Take 1 tablet (420 mg total) by mouth daily. 90 tablet 3  . DULoxetine (CYMBALTA) 60 MG capsule Take 1 capsule (60 mg total) by mouth daily. 90 capsule 3  . EPINEPHrine 0.3 mg/0.3 mL IJ SOAJ injection Inject 0.3 mLs (0.3 mg total) into the muscle once as needed (anaphylaxis reaction). 2 Device 5  . fluticasone (FLONASE) 50 MCG/ACT nasal spray Place 2 sprays into both nostrils daily. 16 g 0  . furosemide (LASIX) 20 MG tablet Take 20 mg by mouth daily as needed.    . hydrALAZINE (APRESOLINE) 100 MG tablet TAKE 1 TABLET(100 MG) BY MOUTH THREE TIMES DAILY 90 tablet 0  . hydrochlorothiazide (MICROZIDE) 12.5 MG capsule Take 12.5 mg by mouth daily.    Marland Kitchen loratadine (CLARITIN) 10 MG tablet Take 1 tablet (10 mg total) by mouth daily. 30 tablet 0  . LORazepam (ATIVAN) 0.5 MG tablet Take  0.5 mg by mouth every 8 (eight) hours as needed for anxiety.    . metoCLOPramide (REGLAN) 10 MG tablet TAKE 1 TABLET BY MOUTH FOUR TIMES DAILY AT BEDTIME WITH MEALS 120 tablet 5  . mometasone (NASONEX) 50 MCG/ACT nasal spray Place 2 sprays into the nose 2 (two) times daily. 17 g 5  . montelukast (SINGULAIR) 10 MG tablet Take 1 tablet (10 mg total) by mouth at bedtime. 30 tablet 5  . Nebivolol HCl (BYSTOLIC) 20 MG TABS 2 tabs by mouth per day (Patient taking differently: 1 tab by mouth per day) 180 tablet 3  .  NITROSTAT 0.4 MG SL tablet PLACE 1 TABLET UNDER TONGUE EVERY 5 MINUTES AS NEEDED FOR CHEST PAIN, MAX OF 3 TABLETS THEN CALL 911 IF PAIN PERSISTS 100 tablet 0  . ondansetron (ZOFRAN) 4 MG tablet Take 4 mg by mouth every 6 (six) hours as needed for nausea or vomiting.    . OXYGEN Use CPAP at bedtime    . potassium chloride SA (K-DUR,KLOR-CON) 20 MEQ tablet Take 2 tablets (40 mEq total) by mouth daily. (Patient taking differently: Take 20 mEq by mouth 2 (two) times daily. ) 180 tablet 2  . pregabalin (LYRICA) 200 MG capsule Take 200 mg by mouth 3 (three) times daily.    . primidone (MYSOLINE) 50 MG tablet Take 1/2 tab daily    . promethazine (PHENERGAN) 12.5 MG tablet Take 1-2 tablets by mouth every 8 hours as needed. Beware of sedation 50 tablet 2  . ranolazine (RANEXA) 500 MG 12 hr tablet Take 1 tablet (500 mg total) by mouth 2 (two) times daily. 180 tablet 1  . Respiratory Therapy Supplies (FLUTTER) DEVI Use as directed 1 each 0  . sodium chloride (OCEAN) 0.65 % SOLN nasal spray Place 1 spray into both nostrils as needed for congestion.    Marland Kitchen spironolactone (ALDACTONE) 100 MG tablet Take 1 tablet (100 mg total) by mouth daily. 90 tablet 1  . topiramate (TOPAMAX) 50 MG tablet Take 1 tablet in the monring and 2 at bedtime. (Patient taking differently: 25 mg 2 (two) times daily. Take 1 tablet in the monring and 2 at bedtime.) 270 tablet 3  . traZODone (DESYREL) 150 MG tablet Take 150 mg by mouth at  bedtime.   1  . valsartan (DIOVAN) 160 MG tablet Take 2 tablets (320 mg total) by mouth daily. 90 tablet 2  . VENTOLIN HFA 108 (90 Base) MCG/ACT inhaler INHALE 2 PUFFS INTO THE LUNGS EVERY 4 HOURS AS NEEDED FOR WHEEZING OR SHORTNESS OF BREATH 18 g 5   No current facility-administered medications on file prior to visit.     Allergies  Allergen Reactions  . Aspirin Shortness Of Breath and Palpitations    Pt can take ibuprofen without reaction  . Bactrim [Sulfamethoxazole-Trimethoprim] Anaphylaxis  . Doxycycline Anaphylaxis  . Penicillins Shortness Of Breath and Palpitations    Has patient had a PCN reaction causing immediate rash, facial/tongue/throat swelling, SOB or lightheadedness with hypotension: Yes Has patient had a PCN reaction causing severe rash involving mucus membranes or skin necrosis: Yes Has patient had a PCN reaction that required hospitalization Yes Has patient had a PCN reaction occurring within the last 10 years: No If all of the above answers are "NO", then may proceed with Cephalosporin use.  Severiano Gilbert Extract Itching and Swelling    Lip swelling  . Strawberry Extract Hives    Blood pressure (!) 192/118, pulse (!) 105.   Assessment/Plan: Hypertension: BP remains elevated above goal. Will increase spironolactone as planned at last visit to 100mg  daily. Follow up in HTN clinic in 2 weeks for repeat BMET and HTN clinic.    Thank you, Lelan Pons. Patterson Hammersmith, [facility]  05/28/2016 9:33 AM

## 2016-05-27 NOTE — Patient Instructions (Signed)
Return for a follow up appointment in 2 weeks  Check your blood pressure at home daily (if able) and keep record of the readings.  Take your BP meds as follows: INCREASE spironolactone to 100mg  daily   Continue all other medications as prescribed.   Bring all of your meds, your BP cuff and your record of home blood pressures to your next appointment.  Exercise as you're able, try to walk approximately 30 minutes per day.  Keep salt intake to a minimum, especially watch canned and prepared boxed foods.  Eat more fresh fruits and vegetables and fewer canned items.  Avoid eating in fast food restaurants.    HOW TO TAKE YOUR BLOOD PRESSURE: . Rest 5 minutes before taking your blood pressure. .  Don't smoke or drink caffeinated beverages for at least 30 minutes before. . Take your blood pressure before (not after) you eat. . Sit comfortably with your back supported and both feet on the floor (don't cross your legs). . Elevate your arm to heart level on a table or a desk. . Use the proper sized cuff. It should fit smoothly and snugly around your bare upper arm. There should be enough room to slip a fingertip under the cuff. The bottom edge of the cuff should be 1 inch above the crease of the elbow. . Ideally, take 3 measurements at one sitting and record the average.

## 2016-05-28 ENCOUNTER — Encounter: Payer: Self-pay | Admitting: Pharmacist

## 2016-06-03 ENCOUNTER — Other Ambulatory Visit: Payer: Self-pay | Admitting: Adult Health

## 2016-06-03 ENCOUNTER — Telehealth: Payer: Self-pay | Admitting: Oncology

## 2016-06-03 DIAGNOSIS — J45991 Cough variant asthma: Secondary | ICD-10-CM

## 2016-06-03 NOTE — Telephone Encounter (Signed)
Patient called to schedule a missed appt for injection and for labs. Patient is aware of new date and time

## 2016-06-04 ENCOUNTER — Ambulatory Visit (HOSPITAL_BASED_OUTPATIENT_CLINIC_OR_DEPARTMENT_OTHER): Payer: Medicare Other

## 2016-06-04 ENCOUNTER — Other Ambulatory Visit (HOSPITAL_BASED_OUTPATIENT_CLINIC_OR_DEPARTMENT_OTHER): Payer: Medicare Other

## 2016-06-04 VITALS — BP 188/102 | HR 95 | Temp 98.5°F | Resp 18

## 2016-06-04 DIAGNOSIS — Z5111 Encounter for antineoplastic chemotherapy: Secondary | ICD-10-CM | POA: Diagnosis present

## 2016-06-04 DIAGNOSIS — C50412 Malignant neoplasm of upper-outer quadrant of left female breast: Secondary | ICD-10-CM | POA: Diagnosis present

## 2016-06-04 LAB — COMPREHENSIVE METABOLIC PANEL
ALBUMIN: 3.9 g/dL (ref 3.5–5.0)
ALT: 26 U/L (ref 0–55)
AST: 18 U/L (ref 5–34)
Alkaline Phosphatase: 77 U/L (ref 40–150)
Anion Gap: 10 mEq/L (ref 3–11)
BUN: 10.7 mg/dL (ref 7.0–26.0)
CO2: 29 mEq/L (ref 22–29)
CREATININE: 0.9 mg/dL (ref 0.6–1.1)
Calcium: 9.4 mg/dL (ref 8.4–10.4)
Chloride: 104 mEq/L (ref 98–109)
EGFR: >90 mL/min/{1.73_m2} (ref >90)
GLUCOSE: 83 mg/dL (ref 70–140)
Potassium: 3.3 mEq/L — ABNORMAL LOW (ref 3.5–5.1)
Sodium: 142 mEq/L (ref 136–145)
Total Bilirubin: 0.22 mg/dL (ref 0.20–1.20)
Total Protein: 7.5 g/dL (ref 6.4–8.3)

## 2016-06-04 MED ORDER — GOSERELIN ACETATE 3.6 MG ~~LOC~~ IMPL
3.6000 mg | DRUG_IMPLANT | Freq: Once | SUBCUTANEOUS | Status: AC
  Administered 2016-06-04: 3.6 mg via SUBCUTANEOUS
  Filled 2016-06-04: qty 3.6

## 2016-06-04 NOTE — Telephone Encounter (Signed)
TP please advise on refill for the tylenol #3.  thanks

## 2016-06-10 ENCOUNTER — Other Ambulatory Visit: Payer: Medicare Other

## 2016-06-10 NOTE — Progress Notes (Deleted)
Patient ID: [name]                 DOB: [birth year]                      MRN: [id number]     HPI: Erica [name] is a 41 y.o. female patient of Dr.  Meda Coffee with PMH below who presents today for hypertension follow up. She also has history of recurrent syncope. The cardio monitor showed only sinus rhythm. 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 Sleep studyshowed severe sleep apnea but she is noncompliant with CPap machine - per patient she is considered noncompliant because she never sleeps more than 4 hours at time. 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 She has had a renal artery scan on 10/13/15 that was negative. She also had a normal TSH in March of 2017.   At her most recent OV she was instructed to increase her spironolactone to 100mg  daily.   Cardiac Hx: CHF, HTN, idiopathic intracranial hypertension, asthma, sleep apnea, obesity, hypertriglyceridemia  Current HTN meds:  Valsartan 320mg  daily in the morning Spironolactone 100mg  daily in the morning Chlorthalidone 25mg  daily in the morning Nebivolol 40mg  daily in the morning Hydralazine 100mg  TID Dilitiazem 420mg  daily in the morning Clonidine 0.3mg  BID  Previously tried: Amlodipine - did not work to control pressure Imdur - worsening headaches  BP goal: <130/90  Family History: She states that none of her close family members have known hypertension or other cardiac issues.   Social History: She denies tobacco and endorses only rare social alcohol consumption.   Diet:She eats most of her meals from home and prepares them without salt. She uses pepper and garlic to season. She does endorse 2-3 Pepsis per day and states that without these she becomes increasingly nauseated and the caffeine helps with her headaches.   Exercise:She tries to walk though she can barely make it 10 minutes without getting SOB. She states she tried P90x once with her  daughters and had to use a breathing treatment.   Home BP readings: has not gotten new machine  Wt Readings from Last 3 Encounters:  05/13/16 190 lb 8 oz (86.4 kg)  04/29/16 188 lb (85.3 kg)  04/29/16 188 lb 4 oz (85.4 kg)   BP Readings from Last 3 Encounters:  06/04/16 (!) 188/102  05/27/16 (!) 192/118  05/13/16 (!) 186/112   Pulse Readings from Last 3 Encounters:  06/04/16 95  05/27/16 (!) 105  05/13/16 98    Renal function: CrCl cannot be calculated (Unknown ideal weight.).  Past Medical History:  Diagnosis Date  . Anemia, unspecified 04/13/2011  . Asthma   . Asthma 09/27/2010  . Bloating 03/18/2011  . Breast cancer ([place]) 2010   a. locally advanced left breast carcinoma diagnosed in 2010 and treated with neoadjuvant chemotherapy with docetaxel and cyclophosphamide as well as paclitaxel. This was followed by radiation therapy which was completed in 2011.  Marland Kitchen Dyspnea on exertion    a. 01/2013 Lexi MV: EF 54%, no ischemia/infarct;  b. 05/2013 Echo: EF 60-65%, no rwma, Gr 2 DD;  b.   . Fatty liver   . Gastroparesis   . GERD (gastroesophageal reflux disease)   . Hypertension   . Impaired glucose tolerance 04/13/2011  . Lymphadenitis, chronic    restricted LEFT extremity  . 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 OSA (obstructive sleep apnea) 05/04/2014  AHI 31/hr now on CPAP at 12cm H2O    Current Outpatient Prescriptions on File Prior to Visit  Medication Sig Dispense Refill  . acetaminophen-codeine (TYLENOL #4) 300-60 MG tablet Take 1 tablet by mouth every 4 (four) hours as needed for moderate pain (or cough). 30 tablet 0  . acetaZOLAMIDE (DIAMOX) 250 MG tablet Take 4 tablets (1,000 mg total) by mouth 3 (three) times daily. 400 tablet 11  . albuterol (PROVENTIL) (2.5 MG/3ML) 0.083% nebulizer solution USE 1 VIAL VIA NEBULIZER EVERY 4 HOURS AS NEEDED( NEED APPOINTMENT) 300 mL 2  . amitriptyline (ELAVIL) 100 MG tablet Take 100 mg by mouth at bedtime.    Marland Kitchen amLODipine  (NORVASC) 10 MG tablet Take 10 mg by mouth daily.    . beclomethasone (QVAR) 80 MCG/ACT inhaler Inhale 2 puffs into the lungs 2 (two) times daily. 1 Inhaler 5  . Biotin 10000 MCG TABS Take 1 tablet by mouth daily.    . budesonide-formoterol (SYMBICORT) 160-4.5 MCG/ACT inhaler Inhale 2 puffs into the lungs 2 (two) times daily. 10.2 g 5  . chlorpheniramine (CHLOR-TRIMETON) 4 MG tablet Take 8 mg by mouth at bedtime.    . chlorthalidone (HYGROTON) 25 MG tablet Take 1 tablet (25 mg total) by mouth daily. 90 tablet 2  . cloNIDine (CATAPRES) 0.3 MG tablet TAKE 1 TABLET BY MOUTH TWICE DAILY 180 tablet 3  . dexlansoprazole (DEXILANT) 60 MG capsule Take 1 capsule (60 mg total) by mouth daily. 30 capsule 5  . dextromethorphan-guaiFENesin (MUCINEX DM) 30-600 MG 12hr tablet Take 1 tablet by mouth 2 (two) times daily as needed for cough.    . dicyclomine (BENTYL) 20 MG tablet TAKE 1 TABLET BY MOUTH 3 TIMES DAILY AS NEEDED FOR SPASMS 90 tablet 3  . diltiazem (CARDIZEM LA) 420 MG 24 hr tablet Take 1 tablet (420 mg total) by mouth daily. 90 tablet 3  . DULoxetine (CYMBALTA) 60 MG capsule Take 1 capsule (60 mg total) by mouth daily. 90 capsule 3  . EPINEPHrine 0.3 mg/0.3 mL IJ SOAJ injection Inject 0.3 mLs (0.3 mg total) into the muscle once as needed (anaphylaxis reaction). 2 Device 5  . fluticasone (FLONASE) 50 MCG/ACT nasal spray Place 2 sprays into both nostrils daily. 16 g 0  . furosemide (LASIX) 20 MG tablet Take 20 mg by mouth daily as needed.    . hydrALAZINE (APRESOLINE) 100 MG tablet TAKE 1 TABLET(100 MG) BY MOUTH THREE TIMES DAILY 90 tablet 0  . hydrochlorothiazide (MICROZIDE) 12.5 MG capsule Take 12.5 mg by mouth daily.    Marland Kitchen loratadine (CLARITIN) 10 MG tablet Take 1 tablet (10 mg total) by mouth daily. 30 tablet 0  . LORazepam (ATIVAN) 0.5 MG tablet Take 0.5 mg by mouth every 8 (eight) hours as needed for anxiety.    . metoCLOPramide (REGLAN) 10 MG tablet TAKE 1 TABLET BY MOUTH FOUR TIMES DAILY AT  BEDTIME WITH MEALS 120 tablet 5  . mometasone (NASONEX) 50 MCG/ACT nasal spray Place 2 sprays into the nose 2 (two) times daily. 17 g 5  . montelukast (SINGULAIR) 10 MG tablet Take 1 tablet (10 mg total) by mouth at bedtime. 30 tablet 5  . Nebivolol HCl (BYSTOLIC) 20 MG TABS 2 tabs by mouth per day (Patient taking differently: 1 tab by mouth per day) 180 tablet 3  . NITROSTAT 0.4 MG SL tablet PLACE 1 TABLET UNDER TONGUE EVERY 5 MINUTES AS NEEDED FOR CHEST PAIN, MAX OF 3 TABLETS THEN CALL 911 IF PAIN PERSISTS 100 tablet 0  .  ondansetron (ZOFRAN) 4 MG tablet Take 4 mg by mouth every 6 (six) hours as needed for nausea or vomiting.    . OXYGEN Use CPAP at bedtime    . potassium chloride SA (K-DUR,KLOR-CON) 20 MEQ tablet Take 2 tablets (40 mEq total) by mouth daily. (Patient taking differently: Take 20 mEq by mouth 2 (two) times daily. ) 180 tablet 2  . pregabalin (LYRICA) 200 MG capsule Take 200 mg by mouth 3 (three) times daily.    . primidone (MYSOLINE) 50 MG tablet Take 1/2 tab daily    . promethazine (PHENERGAN) 12.5 MG tablet Take 1-2 tablets by mouth every 8 hours as needed. Beware of sedation 50 tablet 2  . ranolazine (RANEXA) 500 MG 12 hr tablet Take 1 tablet (500 mg total) by mouth 2 (two) times daily. 180 tablet 1  . Respiratory Therapy Supplies (FLUTTER) DEVI Use as directed 1 each 0  . sodium chloride (OCEAN) 0.65 % SOLN nasal spray Place 1 spray into both nostrils as needed for congestion.    Marland Kitchen spironolactone (ALDACTONE) 100 MG tablet Take 1 tablet (100 mg total) by mouth daily. 90 tablet 1  . topiramate (TOPAMAX) 50 MG tablet Take 1 tablet in the monring and 2 at bedtime. (Patient taking differently: 25 mg 2 (two) times daily. Take 1 tablet in the monring and 2 at bedtime.) 270 tablet 3  . traZODone (DESYREL) 150 MG tablet Take 150 mg by mouth at bedtime.   1  . valsartan (DIOVAN) 160 MG tablet Take 2 tablets (320 mg total) by mouth daily. 90 tablet 2  . VENTOLIN HFA 108 (90 Base) MCG/ACT  inhaler INHALE 2 PUFFS INTO THE LUNGS EVERY 4 HOURS AS NEEDED FOR WHEEZING OR SHORTNESS OF BREATH 18 g 5   No current facility-administered medications on file prior to visit.     Allergies  Allergen Reactions  . Aspirin Shortness Of Breath and Palpitations    Pt can take ibuprofen without reaction  . Bactrim [Sulfamethoxazole-Trimethoprim] Anaphylaxis  . Doxycycline Anaphylaxis  . Penicillins Shortness Of Breath and Palpitations    Has patient had a PCN reaction causing immediate rash, facial/tongue/throat swelling, SOB or lightheadedness with hypotension: Yes Has patient had a PCN reaction causing severe rash involving mucus membranes or skin necrosis: Yes Has patient had a PCN reaction that required hospitalization Yes Has patient had a PCN reaction occurring within the last 10 years: No If all of the above answers are "NO", then may proceed with Cephalosporin use.  Severiano Gilbert Extract Itching and Swelling    Lip swelling  . Strawberry Extract Hives    There were no vitals taken for this visit.   Assessment/Plan: Hypertension:    Thank you, Lelan Pons. Patterson Hammersmith, [facility]  06/10/2016 4:22 PM

## 2016-06-11 ENCOUNTER — Other Ambulatory Visit: Payer: Medicare Other

## 2016-06-11 ENCOUNTER — Ambulatory Visit: Payer: Medicare Other

## 2016-06-14 NOTE — Telephone Encounter (Signed)
Last ov w/ MW 12.22.17: Patient Instructions  Prednisone 10 mg take  4 each am x 2 days,   2 each am x 2 days,  1 each am x 2 days and stop    For cough take mucinex dm 1200 mg every 12 hours or delsym 2 tsp every 12 hours as needed and cough into the flutter valve    If still can't stop the cough, add tylenol #4 up to 1-2 every 4 hours -whatever it takes to completely stop the coughing x 3 straight days   GERD (REFLUX)  is an extremely common cause of respiratory symptoms just like yours , many times with no obvious heartburn at all.     It can be treated with medication, but also with lifestyle changes including elevation of the head of your bed (ideally with 6 inch  bed blocks),  Smoking cessation, avoidance of late meals, excessive alcohol, and avoid fatty foods, chocolate, peppermint, colas, red wine, and acidic juices such as orange juice.  NO MINT OR MENTHOL PRODUCTS SO NO COUGH DROPS  USE SUGARLESS CANDY INSTEAD (Jolley ranchers or Stover's or Life Savers) or even ice chips will also do - the key is to swallow to prevent all throat clearing. NO OIL BASED VITAMINS - use powdered substitutes.     See Tammy NP w/in 4 weeks with all your medications, even over the counter meds, separated in two separate bags, the ones you take no matter what vs the ones you stop once you feel better and take only as needed when you feel you need them.   Tammy  will generate for you a new user friendly medication calendar that will put [place] all on the same page re: your medication use.      Without this process, it simply isn't possible to assure that we are providing  your outpatient care  with  the attention to detail we feel you deserve.   If we cannot assure that you're getting that kind of care,  then we cannot manage your problem effectively from this clinic.   Once you have seen Tammy and we are sure that we're all on the same page with your medication use she will arrange follow up with me.     Pt saw  TP for med calendar on 1.19.18 and Tylenol #4 was refilled for #30 with no refills Dr Melvyn Novas, please advise if pt may receive additional refills.  Thank you.

## 2016-06-14 NOTE — Telephone Encounter (Signed)
Ok x one > ov with med calendar in hand before more

## 2016-06-17 ENCOUNTER — Encounter: Payer: Self-pay | Admitting: Neurology

## 2016-07-03 ENCOUNTER — Encounter (HOSPITAL_COMMUNITY): Payer: Self-pay

## 2016-07-04 ENCOUNTER — Other Ambulatory Visit: Payer: Self-pay | Admitting: Internal Medicine

## 2016-07-04 ENCOUNTER — Other Ambulatory Visit: Payer: Self-pay | Admitting: Cardiology

## 2016-07-09 ENCOUNTER — Telehealth: Payer: Self-pay | Admitting: *Deleted

## 2016-07-09 NOTE — Telephone Encounter (Signed)
Faxed PA for MATZIM LA to [facility]

## 2016-07-10 NOTE — Telephone Encounter (Signed)
Received PA approval for Matzim from [employer]. Approval good from 04/10/2016 until 07/09/2017. I have faxed approval letter to [facility].

## 2016-07-12 ENCOUNTER — Other Ambulatory Visit: Payer: Self-pay | Admitting: Internal Medicine

## 2016-07-12 ENCOUNTER — Other Ambulatory Visit: Payer: Self-pay | Admitting: Cardiology

## 2016-07-12 NOTE — Telephone Encounter (Signed)
Pt was to come back in 4 weeks from 01/2016 appt.   No appt made. No upcoming appt. Please advise.

## 2016-07-12 NOTE — Telephone Encounter (Signed)
furosemide (LASIX) 20 MG tablet  Medication  Date: 04/29/2016 Department: [facility] Ordering: Christie Beckers, RN Authorizing: Historical Provider, MD  Order Providers   Documenting Provider Encounter Provider  Historical Provider, MD Melvenia Needles, NP  Supervision Information   Type of Supervision  No Supervision  Medication Detail    Disp Refills Start End   furosemide (LASIX) 20 MG tablet       Sig - Route: Take 20 mg by mouth daily as needed. - Oral   Class: Historical Med   Pharmacy   [id number] - [place], [place] - [address] AT [place]

## 2016-07-13 ENCOUNTER — Other Ambulatory Visit: Payer: Self-pay | Admitting: Internal Medicine

## 2016-07-16 ENCOUNTER — Other Ambulatory Visit: Payer: Self-pay | Admitting: Cardiology

## 2016-07-16 ENCOUNTER — Other Ambulatory Visit: Payer: Self-pay | Admitting: Internal Medicine

## 2016-07-16 ENCOUNTER — Telehealth: Payer: Self-pay | Admitting: Internal Medicine

## 2016-07-16 NOTE — Telephone Encounter (Signed)
Per pharmacy, pt needed a PA for her albuterol solution. PA has been started on CMM.   Key is [id number].   Will check on status of PA later.

## 2016-07-17 NOTE — Telephone Encounter (Signed)
This was ordered by Pulmonology Rexene Edison NP.  Please refer to her for further refills.  Thanks

## 2016-07-23 NOTE — Telephone Encounter (Signed)
Check CMM it was denied because medication needs to be ran through Part B not part D. When I called the pharmacy they stated pt just left stating she gets her medication through a DME. Contacted pt and she verified that she gets it through [facility] and is not having any problems getting medications at this time.

## 2016-07-31 ENCOUNTER — Ambulatory Visit: Payer: Medicare Other | Admitting: Neurology

## 2016-07-31 DIAGNOSIS — Z029 Encounter for administrative examinations, unspecified: Secondary | ICD-10-CM

## 2016-08-06 ENCOUNTER — Encounter: Payer: Self-pay | Admitting: Neurology

## 2016-08-18 ENCOUNTER — Other Ambulatory Visit: Payer: Self-pay | Admitting: Internal Medicine

## 2016-08-22 ENCOUNTER — Ambulatory Visit: Payer: Medicare Other | Admitting: Internal Medicine

## 2016-09-26 ENCOUNTER — Encounter (HOSPITAL_COMMUNITY): Payer: Self-pay

## 2016-09-29 ENCOUNTER — Other Ambulatory Visit: Payer: Self-pay | Admitting: Internal Medicine

## 2016-10-09 ENCOUNTER — Other Ambulatory Visit: Payer: Self-pay | Admitting: Internal Medicine

## 2016-10-09 ENCOUNTER — Other Ambulatory Visit: Payer: Self-pay | Admitting: Cardiology

## 2016-10-14 ENCOUNTER — Other Ambulatory Visit: Payer: Self-pay | Admitting: Oncology

## 2016-10-15 ENCOUNTER — Ambulatory Visit (INDEPENDENT_AMBULATORY_CARE_PROVIDER_SITE_OTHER): Payer: Medicare Other | Admitting: Internal Medicine

## 2016-10-15 ENCOUNTER — Other Ambulatory Visit (INDEPENDENT_AMBULATORY_CARE_PROVIDER_SITE_OTHER): Payer: Medicare Other

## 2016-10-15 ENCOUNTER — Encounter: Payer: Self-pay | Admitting: Internal Medicine

## 2016-10-15 VITALS — BP 170/90 | HR 98 | Temp 98.5°F | Resp 16 | Ht 66.0 in | Wt 195.5 lb

## 2016-10-15 DIAGNOSIS — E669 Obesity, unspecified: Secondary | ICD-10-CM

## 2016-10-15 DIAGNOSIS — E876 Hypokalemia: Secondary | ICD-10-CM

## 2016-10-15 DIAGNOSIS — Z124 Encounter for screening for malignant neoplasm of cervix: Secondary | ICD-10-CM

## 2016-10-15 DIAGNOSIS — N938 Other specified abnormal uterine and vaginal bleeding: Secondary | ICD-10-CM | POA: Diagnosis not present

## 2016-10-15 DIAGNOSIS — I10 Essential (primary) hypertension: Secondary | ICD-10-CM

## 2016-10-15 DIAGNOSIS — R10817 Generalized abdominal tenderness: Secondary | ICD-10-CM | POA: Diagnosis not present

## 2016-10-15 DIAGNOSIS — N912 Amenorrhea, unspecified: Secondary | ICD-10-CM | POA: Insufficient documentation

## 2016-10-15 DIAGNOSIS — E781 Pure hyperglyceridemia: Secondary | ICD-10-CM

## 2016-10-15 LAB — CBC WITH DIFFERENTIAL/PLATELET
BASOS PCT: 0.8 % (ref 0.0–3.0)
Basophils Absolute: 0.1 10*3/uL (ref 0.0–0.1)
EOS ABS: 0.1 10*3/uL (ref 0.0–0.7)
Eosinophils Relative: 0.9 % (ref 0.0–5.0)
HCT: 40.8 % (ref 36.0–46.0)
HEMOGLOBIN: 13.7 g/dL (ref 12.0–15.0)
Lymphocytes Relative: 26.3 % (ref 12.0–46.0)
Lymphs Abs: 2.4 10*3/uL (ref 0.7–4.0)
MCHC: 33.5 g/dL (ref 30.0–36.0)
MCV: 88.9 fl (ref 78.0–100.0)
MONO ABS: 0.8 10*3/uL (ref 0.1–1.0)
Monocytes Relative: 8.5 % (ref 3.0–12.0)
Neutro Abs: 5.9 10*3/uL (ref 1.4–7.7)
Neutrophils Relative %: 63.5 % (ref 43.0–77.0)
Platelets: 200 10*3/uL (ref 150.0–400.0)
RBC: 4.59 Mil/uL (ref 3.87–5.11)
RDW: 14.2 % (ref 11.5–15.5)
WBC: 9.3 10*3/uL (ref 4.0–10.5)

## 2016-10-15 NOTE — Progress Notes (Signed)
Subjective:  Patient ID: Erica [name], female    DOB: 02-Jan-1976  Age: 41 y.o. MRN: [id number]  CC: Hypertension; Hyperlipidemia; and Abdominal Pain   HPI Erica [name] presents for the complaint of a one-week history of abdominal pain in the middle lower part of her abdomen. She describes it as an intermittent hard and piercing sensation. She complains of urinary frequency but denies dysuria or hematuria. She'denies nausea, vomiting, loss of appetite, diarrhea, constipation, or weight loss.  Her blood pressure is not been well controlled. She is not sure which medication she has and has not been compliant with.  Outpatient Medications Prior to Visit  Medication Sig Dispense Refill  . acetaZOLAMIDE (DIAMOX) 250 MG tablet Take 4 tablets (1,000 mg total) by mouth 3 (three) times daily. 400 tablet 11  . albuterol (PROVENTIL) (2.5 MG/3ML) 0.083% nebulizer solution USE 1 VIAL VIA NEBULIZER EVERY 4 HOURS AS NEEDED( NEED APPOINTMENT) 300 mL 2  . amitriptyline (ELAVIL) 100 MG tablet Take 100 mg by mouth at bedtime.    Marland Kitchen amLODipine (NORVASC) 10 MG tablet TAKE 1 TABLET BY MOUTH ONCE DAILY 90 tablet 0  . Biotin 10000 MCG TABS Take 1 tablet by mouth daily.    . chlorpheniramine (CHLOR-TRIMETON) 4 MG tablet Take 8 mg by mouth at bedtime.    . chlorthalidone (HYGROTON) 25 MG tablet Take 1 tablet (25 mg total) by mouth daily. 90 tablet 2  . cloNIDine (CATAPRES) 0.3 MG tablet TAKE 1 TABLET BY MOUTH TWICE A DAY 180 tablet 1  . DEXILANT 60 MG capsule TAKE 1 CAPSULE BY MOUTH ONCE DAILY 30 capsule 3  . dextromethorphan-guaiFENesin (MUCINEX DM) 30-600 MG 12hr tablet Take 1 tablet by mouth 2 (two) times daily as needed for cough.    . dicyclomine (BENTYL) 20 MG tablet TAKE 1 TABLET BY MOUTH 3 TIMES DAILY AS NEEDED FOR SPASMS 90 tablet 1  . DULoxetine (CYMBALTA) 60 MG capsule Take 1 capsule (60 mg total) by mouth daily. 90 capsule 3  . EPINEPHrine 0.3 mg/0.3 mL IJ SOAJ injection Inject 0.3 mLs (0.3 mg total)  into the muscle once as needed (anaphylaxis reaction). 2 Device 5  . fluticasone (FLONASE) 50 MCG/ACT nasal spray Place 2 sprays into both nostrils daily. 16 g 0  . furosemide (LASIX) 20 MG tablet Take 20 mg by mouth daily as needed.    . hydrALAZINE (APRESOLINE) 100 MG tablet TAKE 1 TABLET(100 MG) BY MOUTH THREE TIMES DAILY 90 tablet 0  . loratadine (CLARITIN) 10 MG tablet Take 1 tablet (10 mg total) by mouth daily. 30 tablet 0  . MATZIM LA 420 MG 24 hr tablet TAKE 1 TABLET BY MOUTH ONCE DAILY 90 tablet 0  . metoCLOPramide (REGLAN) 10 MG tablet TAKE 1 TABLET BY MOUTH FOUR TIMES DAILY AT BEDTIME WITH MEALS 120 tablet 5  . mometasone (NASONEX) 50 MCG/ACT nasal spray Place 2 sprays into the nose 2 (two) times daily. 17 g 5  . montelukast (SINGULAIR) 10 MG tablet Take 1 tablet (10 mg total) by mouth at bedtime. 30 tablet 5  . Nebivolol HCl (BYSTOLIC) 20 MG TABS 2 tabs by mouth per day (Patient taking differently: 1 tab by mouth per day) 180 tablet 3  . NITROSTAT 0.4 MG SL tablet PLACE 1 TABLET UNDER TONGUE EVERY 5 MINUTES AS NEEDED FOR CHEST PAIN, MAX OF 3 TABLETS THEN CALL 911 IF PAIN PERSISTS 100 tablet 0  . ondansetron (ZOFRAN) 4 MG tablet Take 4 mg by mouth every 6 (six)  hours as needed for nausea or vomiting.    . OXYGEN Use CPAP at bedtime    . potassium chloride SA (K-DUR,KLOR-CON) 20 MEQ tablet Take 2 tablets (40 mEq total) by mouth daily. (Patient taking differently: Take 20 mEq by mouth 2 (two) times daily. ) 180 tablet 2  . pregabalin (LYRICA) 200 MG capsule Take 200 mg by mouth 3 (three) times daily.    . primidone (MYSOLINE) 50 MG tablet Take 1/2 tab daily    . QVAR 80 MCG/ACT inhaler INHALE 2 PUFFS INTO LUNGS 2 TIMES A DAY AS DIRECTED 8.7 g 5  . ranolazine (RANEXA) 500 MG 12 hr tablet Take 1 tablet (500 mg total) by mouth 2 (two) times daily. 180 tablet 1  . Respiratory Therapy Supplies (FLUTTER) DEVI Use as directed 1 each 0  . sodium chloride (OCEAN) 0.65 % SOLN nasal spray Place 1  spray into both nostrils as needed for congestion.    Marland Kitchen spironolactone (ALDACTONE) 100 MG tablet Take 1 tablet (100 mg total) by mouth daily. 90 tablet 1  . SYMBICORT 160-4.5 MCG/ACT inhaler INHALE 2 PUFFS INTO LUNGS 2 TIMES DAILY AS DIRECTED 10.2 Inhaler 5  . topiramate (TOPAMAX) 50 MG tablet Take 1 tablet in the monring and 2 at bedtime. (Patient taking differently: 25 mg 2 (two) times daily. Take 1 tablet in the monring and 2 at bedtime.) 270 tablet 3  . traZODone (DESYREL) 150 MG tablet TAKE 1 TABLET BY MOUTH AT BEDTIME 90 tablet 1  . valsartan (DIOVAN) 160 MG tablet Take 2 tablets (320 mg total) by mouth daily. 90 tablet 2  . VENTOLIN HFA 108 (90 Base) MCG/ACT inhaler INHALE 2 PUFFS INTO THE LUNGS EVERY 4 HOURS AS NEEDED FOR WHEEZING OR SHORTNESS OF BREATH 18 Inhaler 1  . acetaminophen-codeine (TYLENOL #4) 300-60 MG tablet TAKE 1 TABLET BY MOUTH EVERY 4 HOURS AS NEEDED FOR MODERATE PAIN 30 tablet 0  . hydrochlorothiazide (MICROZIDE) 12.5 MG capsule Take 12.5 mg by mouth daily.    Marland Kitchen LORazepam (ATIVAN) 0.5 MG tablet Take 0.5 mg by mouth every 8 (eight) hours as needed for anxiety.    . promethazine (PHENERGAN) 12.5 MG tablet Take 1-2 tablets by mouth every 8 hours as needed. Beware of sedation 50 tablet 2   No facility-administered medications prior to visit.     ROS Review of Systems  Constitutional: Negative.  Negative for chills, diaphoresis, fatigue and unexpected weight change.  HENT: Negative.  Negative for trouble swallowing.   Eyes: Negative.   Respiratory: Negative for cough, chest tightness, shortness of breath and wheezing.   Cardiovascular: Negative for chest pain, palpitations and leg swelling.  Gastrointestinal: Positive for abdominal pain. Negative for abdominal distention, blood in stool, constipation, diarrhea, nausea, rectal pain and vomiting.  Endocrine: Negative.   Genitourinary: Positive for frequency. Negative for decreased urine volume, difficulty urinating, dysuria,  flank pain, hematuria, urgency, vaginal bleeding and vaginal discharge.  Musculoskeletal: Negative.  Negative for back pain and myalgias.  Skin: Negative.  Negative for color change and rash.  Allergic/Immunologic: Negative.   Neurological: Negative.  Negative for dizziness.  Hematological: Negative for adenopathy. Does not bruise/bleed easily.  Psychiatric/Behavioral: Negative.     Objective:  BP (!) 170/90 (BP Location: Left Arm, Patient Position: Sitting, Cuff Size: Normal)   Pulse 98   Temp 98.5 F (36.9 C) (Oral)   Resp 16   Ht 5\' 6"  (1.676 m)   Wt 195 lb 8 oz (88.7 kg)   LMP 10/05/2015  SpO2 99%   BMI 31.55 kg/m   BP Readings from Last 3 Encounters:  10/15/16 (!) 170/90  06/04/16 (!) 188/102  05/27/16 (!) 192/118    Wt Readings from Last 3 Encounters:  10/15/16 195 lb 8 oz (88.7 kg)  05/13/16 190 lb 8 oz (86.4 kg)  04/29/16 188 lb (85.3 kg)    Physical Exam  Constitutional: She is oriented to person, place, and time.  Non-toxic appearance. She does not have a sickly appearance. She does not appear ill. No distress.  HENT:  Mouth/Throat: Oropharynx is clear and moist. No oropharyngeal exudate.  Eyes: Conjunctivae are normal. Right eye exhibits no discharge. Left eye exhibits no discharge. No scleral icterus.  Neck: Normal range of motion. Neck supple. No JVD present. No thyromegaly present.  Cardiovascular: Normal rate, regular rhythm and intact distal pulses.  Exam reveals no gallop and no friction rub.   No murmur heard. Pulmonary/Chest: Effort normal and breath sounds normal. No respiratory distress. She has no wheezes. She has no rales. She exhibits no tenderness.  Abdominal: Soft. Normal appearance and bowel sounds are normal. She exhibits no distension, no ascites and no mass. There is no hepatosplenomegaly, splenomegaly or hepatomegaly. There is tenderness in the epigastric area, periumbilical area and suprapubic area. There is no rigidity, no rebound, no  guarding, no CVA tenderness, no tenderness at McBurney's point and negative Murphy's sign. No hernia. Hernia confirmed negative in the ventral area, confirmed negative in the right inguinal area and confirmed negative in the left inguinal area.  Genitourinary: Rectum normal and uterus normal. Rectal exam shows no external hemorrhoid, no internal hemorrhoid, no fissure, no mass, no tenderness, anal tone normal and guaiac negative stool. No breast swelling, tenderness, discharge or bleeding. Pelvic exam was performed with patient supine. There is no rash, lesion or injury on the right labia. There is no rash, tenderness, lesion or injury on the left labia. Uterus is not deviated, not enlarged, not fixed and not tender. Cervix exhibits no motion tenderness, no discharge and no friability. Right adnexum displays no mass, no tenderness and no fullness. Left adnexum displays no mass, no tenderness and no fullness. There is bleeding in the vagina. No erythema or tenderness in the vagina. No foreign body in the vagina. No signs of injury around the vagina. No vaginal discharge found.  Genitourinary Comments: There is a scant amount of dried blood around the cervix.  Musculoskeletal: Normal range of motion. She exhibits no edema or tenderness.  Lymphadenopathy:    She has no cervical adenopathy.       Right: No inguinal adenopathy present.       Left: No inguinal adenopathy present.  Neurological: She is alert and oriented to person, place, and time.  Skin: Skin is warm and dry. No rash noted. She is not diaphoretic. No erythema. No pallor.  Vitals reviewed.   Lab Results  Component Value Date   WBC 9.3 10/15/2016   HGB 13.7 10/15/2016   HCT 40.8 10/15/2016   PLT 200.0 10/15/2016   GLUCOSE 80 10/15/2016   CHOL 195 10/15/2016   TRIG 193.0 (H) 10/15/2016   HDL 42.30 10/15/2016   LDLCALC 114 (H) 10/15/2016   ALT 20 10/15/2016   AST 16 10/15/2016   NA 138 10/15/2016   K 3.5 10/15/2016   CL 102  10/15/2016   CREATININE 1.08 10/15/2016   BUN 11 10/15/2016   CO2 30 10/15/2016   TSH 1.06 06/15/2015   INR 1.1 (H) 06/03/2012  HGBA1C 5.6 08/22/2015    No results found.  Assessment & Plan:   Kalana was seen today for hypertension, hyperlipidemia and abdominal pain.  Diagnoses and all orders for this visit:  Hypokalemia-  Improvement noted, will continue spironolactone. -     Basic metabolic panel; Future -     Magnesium; Future  Mild obesity- she is working on her lifestyle modifications to lose weight.  Essential hypertension- her blood pressure is not well controlled. She is going to clarify which meds she is Vashti Hey not taking and I will make recommendations at that time. -     Basic metabolic panel; Future -     Magnesium; Future  Hypertriglyceridemia without hypercholesterolemia- improvement noted -     Lipid panel; Future  Generalized abdominal tenderness without rebound tenderness- I tried to order serum beta hCG but was refused this by her Medicare insurance. Her examination is remarkable for tenderness but there is no evidence of an acute abdomen. Her labs are all within normal limits so I don't think there is any significant GI pathology to explain her symptoms. She does have a history of IBS and the pain seems most consistent with that. -     CBC with Differential/Platelet; Future -     Urinalysis, Routine w reflex microscopic; Future -     Hepatic function panel; Future  Amenorrhea- My order for a beta-hCG was refused by her insurance company. Will check a pelvic ultrasound to see if there is a secondary cause for this. -     US Pelvis Complete; Future  DUB (dysfunctional uterine bleeding)- as above. -     US Pelvis Complete; Future  Screening for malignant neoplasm of cervix -     Cytology - PAP   I have discontinued Ms. Esquibel's promethazine, hydrochlorothiazide, LORazepam, and acetaminophen-codeine. I am also having her maintain her FLUTTER, OXYGEN,  mometasone, albuterol, EPINEPHrine, NITROSTAT, DULoxetine, primidone, Nebivolol HCl, acetaZOLAMIDE, metoCLOPramide, ranolazine, hydrALAZINE, potassium chloride SA, chlorthalidone, montelukast, valsartan, topiramate, Biotin, pregabalin, chlorpheniramine, dextromethorphan-guaiFENesin, ondansetron, amitriptyline, furosemide, sodium chloride, loratadine, fluticasone, spironolactone, MATZIM LA, traZODone, DEXILANT, dicyclomine, amLODipine, cloNIDine, SYMBICORT, QVAR, and VENTOLIN HFA.  No orders of the defined types were placed in this encounter.    Follow-up: Return in about 1 week (around 10/22/2016).  Scarlette Calico, MD

## 2016-10-15 NOTE — Patient Instructions (Signed)

## 2016-10-16 ENCOUNTER — Other Ambulatory Visit: Payer: Medicare Other

## 2016-10-16 ENCOUNTER — Other Ambulatory Visit (HOSPITAL_COMMUNITY)
Admission: RE | Admit: 2016-10-16 | Discharge: 2016-10-16 | Disposition: A | Discharge status: Home / Self Care | Payer: Medicare Other | Source: Ambulatory Visit | Attending: Internal Medicine | Admitting: Internal Medicine

## 2016-10-16 ENCOUNTER — Encounter: Payer: Self-pay | Admitting: Internal Medicine

## 2016-10-16 DIAGNOSIS — Z124 Encounter for screening for malignant neoplasm of cervix: Secondary | ICD-10-CM | POA: Diagnosis not present

## 2016-10-16 DIAGNOSIS — N912 Amenorrhea, unspecified: Secondary | ICD-10-CM | POA: Diagnosis not present

## 2016-10-16 DIAGNOSIS — N938 Other specified abnormal uterine and vaginal bleeding: Secondary | ICD-10-CM | POA: Diagnosis not present

## 2016-10-16 LAB — BASIC METABOLIC PANEL
BUN: 11 mg/dL (ref 6–23)
CALCIUM: 9.5 mg/dL (ref 8.4–10.5)
CO2: 30 mEq/L (ref 19–32)
CREATININE: 1.08 mg/dL (ref 0.40–1.20)
Chloride: 102 mEq/L (ref 96–112)
GFR: 71.18 mL/min (ref >60.00)
Glucose, Bld: 80 mg/dL (ref 70–99)
Potassium: 3.5 mEq/L (ref 3.5–5.1)
Sodium: 138 mEq/L (ref 135–145)

## 2016-10-16 LAB — HEPATIC FUNCTION PANEL
ALBUMIN: 4.1 g/dL (ref 3.5–5.2)
ALT: 20 U/L (ref 0–35)
AST: 16 U/L (ref 0–37)
Alkaline Phosphatase: 74 U/L (ref 39–117)
BILIRUBIN DIRECT: 0.1 mg/dL (ref 0.0–0.3)
TOTAL PROTEIN: 7.2 g/dL (ref 6.0–8.3)
Total Bilirubin: 0.4 mg/dL (ref 0.2–1.2)

## 2016-10-16 LAB — LIPID PANEL
CHOL/HDL RATIO: 5
Cholesterol: 195 mg/dL (ref 0–200)
HDL: 42.3 mg/dL (ref >39.00)
LDL CALC: 114 mg/dL — AB (ref 0–99)
NONHDL: 152.93
Triglycerides: 193 mg/dL — ABNORMAL HIGH (ref 0.0–149.0)
VLDL: 38.6 mg/dL (ref 0.0–40.0)

## 2016-10-16 LAB — URINALYSIS, ROUTINE W REFLEX MICROSCOPIC
BILIRUBIN URINE: NEGATIVE
Leukocytes, UA: NEGATIVE
Nitrite: NEGATIVE
PH: 6 (ref 5.0–8.0)
Specific Gravity, Urine: 1.015 (ref 1.000–1.030)
TOTAL PROTEIN, URINE-UPE24: 100 — AB
URINE GLUCOSE: NEGATIVE
UROBILINOGEN UA: 0.2 (ref 0.0–1.0)

## 2016-10-16 LAB — MAGNESIUM: MAGNESIUM: 2 mg/dL (ref 1.5–2.5)

## 2016-10-17 ENCOUNTER — Telehealth: Payer: Self-pay

## 2016-10-17 ENCOUNTER — Other Ambulatory Visit: Payer: Self-pay

## 2016-10-17 DIAGNOSIS — C50412 Malignant neoplasm of upper-outer quadrant of left female breast: Secondary | ICD-10-CM

## 2016-10-17 NOTE — Telephone Encounter (Signed)
vm received from pt.  msg cut off after pt name and number.  No other info obtained.  Call returned and lvm for pt to call back with more info.

## 2016-10-18 ENCOUNTER — Other Ambulatory Visit: Payer: Self-pay | Admitting: Physician Assistant

## 2016-10-18 ENCOUNTER — Other Ambulatory Visit: Payer: Self-pay | Admitting: Internal Medicine

## 2016-10-18 LAB — CYTOLOGY - PAP
Chlamydia: NEGATIVE
Diagnosis: NEGATIVE
HPV (WINDOPATH): NOT DETECTED
Neisseria Gonorrhea: NEGATIVE

## 2016-10-19 ENCOUNTER — Encounter: Payer: Self-pay | Admitting: Internal Medicine

## 2016-10-19 LAB — HM PAP SMEAR

## 2016-10-21 ENCOUNTER — Telehealth: Payer: Self-pay | Admitting: Oncology

## 2016-10-21 ENCOUNTER — Other Ambulatory Visit: Payer: Self-pay | Admitting: Physician Assistant

## 2016-10-21 ENCOUNTER — Other Ambulatory Visit: Payer: Self-pay | Admitting: Internal Medicine

## 2016-10-21 ENCOUNTER — Other Ambulatory Visit: Payer: Self-pay | Admitting: Cardiology

## 2016-10-21 ENCOUNTER — Telehealth: Payer: Self-pay | Admitting: *Deleted

## 2016-10-21 NOTE — Telephone Encounter (Signed)
"  I'm trying to schedule the injection I receive monthly.  Call me at [phone]."

## 2016-10-21 NOTE — Telephone Encounter (Signed)
sw pt to confirm 7/25 appt per sch msg

## 2016-10-22 ENCOUNTER — Other Ambulatory Visit: Payer: Self-pay | Admitting: Cardiology

## 2016-10-22 DIAGNOSIS — I119 Hypertensive heart disease without heart failure: Secondary | ICD-10-CM

## 2016-10-22 DIAGNOSIS — I10 Essential (primary) hypertension: Secondary | ICD-10-CM

## 2016-10-22 MED ORDER — SPIRONOLACTONE 100 MG PO TABS: 100.0000 mg | ORAL_TABLET | Freq: Every day | ORAL | 1 refills | Status: DC

## 2016-10-23 ENCOUNTER — Ambulatory Visit (HOSPITAL_BASED_OUTPATIENT_CLINIC_OR_DEPARTMENT_OTHER): Payer: Medicare Other | Admitting: Oncology

## 2016-10-23 ENCOUNTER — Other Ambulatory Visit (HOSPITAL_BASED_OUTPATIENT_CLINIC_OR_DEPARTMENT_OTHER): Payer: Medicare Other

## 2016-10-23 ENCOUNTER — Other Ambulatory Visit: Payer: Self-pay

## 2016-10-23 ENCOUNTER — Ambulatory Visit (HOSPITAL_BASED_OUTPATIENT_CLINIC_OR_DEPARTMENT_OTHER): Payer: Medicare Other

## 2016-10-23 VITALS — BP 179/105 | HR 103 | Temp 98.7°F | Resp 17 | Ht 66.0 in | Wt 200.0 lb

## 2016-10-23 DIAGNOSIS — Z171 Estrogen receptor negative status [ER-]: Secondary | ICD-10-CM

## 2016-10-23 DIAGNOSIS — I10 Essential (primary) hypertension: Secondary | ICD-10-CM | POA: Diagnosis not present

## 2016-10-23 DIAGNOSIS — Z5111 Encounter for antineoplastic chemotherapy: Secondary | ICD-10-CM

## 2016-10-23 DIAGNOSIS — C50412 Malignant neoplasm of upper-outer quadrant of left female breast: Secondary | ICD-10-CM | POA: Diagnosis not present

## 2016-10-23 LAB — CBC WITH DIFFERENTIAL/PLATELET
BASO%: 0.1 % (ref 0.0–2.0)
BASOS ABS: 0 10*3/uL (ref 0.0–0.1)
EOS ABS: 0.1 10*3/uL (ref 0.0–0.5)
EOS%: 1.1 % (ref 0.0–7.0)
HEMATOCRIT: 39.6 % (ref 34.8–46.6)
HEMOGLOBIN: 13.3 g/dL (ref 11.6–15.9)
LYMPH#: 1.9 10*3/uL (ref 0.9–3.3)
LYMPH%: 27 % (ref 14.0–49.7)
MCH: 29.8 pg (ref 25.1–34.0)
MCHC: 33.6 g/dL (ref 31.5–36.0)
MCV: 88.8 fL (ref 79.5–101.0)
MONO#: 0.5 10*3/uL (ref 0.1–0.9)
MONO%: 6.6 % (ref 0.0–14.0)
NEUT#: 4.6 10*3/uL (ref 1.5–6.5)
NEUT%: 65.2 % (ref 38.4–76.8)
PLATELETS: 189 10*3/uL (ref 145–400)
RBC: 4.46 10*6/uL (ref 3.70–5.45)
RDW: 14.3 % (ref 11.2–14.5)
WBC: 7.1 10*3/uL (ref 3.9–10.3)

## 2016-10-23 LAB — COMPREHENSIVE METABOLIC PANEL
ALBUMIN: 3.6 g/dL (ref 3.5–5.0)
ALK PHOS: 76 U/L (ref 40–150)
ALT: 26 U/L (ref 0–55)
ANION GAP: 9 meq/L (ref 3–11)
AST: 24 U/L (ref 5–34)
BUN: 11.9 mg/dL (ref 7.0–26.0)
CALCIUM: 9.3 mg/dL (ref 8.4–10.4)
CO2: 28 mEq/L (ref 22–29)
Chloride: 103 mEq/L (ref 98–109)
Creatinine: 1.1 mg/dL (ref 0.6–1.1)
EGFR: 68 mL/min/{1.73_m2} — AB (ref >90)
Glucose: 96 mg/dl (ref 70–140)
POTASSIUM: 3.4 meq/L — AB (ref 3.5–5.1)
Sodium: 140 mEq/L (ref 136–145)
Total Bilirubin: 0.44 mg/dL (ref 0.20–1.20)
Total Protein: 7.3 g/dL (ref 6.4–8.3)

## 2016-10-23 MED ORDER — GOSERELIN ACETATE 3.6 MG ~~LOC~~ IMPL
3.6000 mg | DRUG_IMPLANT | Freq: Once | SUBCUTANEOUS | Status: AC
  Administered 2016-10-23: 3.6 mg via SUBCUTANEOUS
  Filled 2016-10-23: qty 3.6

## 2016-10-23 MED ORDER — LIDOCAINE-PRILOCAINE 2.5-2.5 % EX CREA: 1.0000 "application " | TOPICAL_CREAM | CUTANEOUS | 0 refills | Status: DC | PRN

## 2016-10-23 MED ORDER — TRAMADOL HCL 50 MG PO TABS: 50.0000 mg | ORAL_TABLET | Freq: Four times a day (QID) | ORAL | 4 refills | Status: DC | PRN

## 2016-10-23 NOTE — Progress Notes (Signed)
ID: Erica [name]   DOB: Mar 18, 1976  MR#: [id number]  NWG#:[id number]  VHQ:IONGE, Arvid Right, MD GYN: SU:  Erroll Luna, MD Other:  Christinia Gully, MD;  Zenovia Jarred, MD;  Ena Dawley, MD, Luberta Mutter MD, Everitt Amber MD  CHIEF COMPLAINT:  Triple negative Breast Cancer  CURRENT TREATMENT: Observation  BREAST CANCER HISTORY: From the original intake note:  Erica [name] noticed a lump in her left breast sometime in August or September 2009.  Initially this was thought to possibly be "dry breast milk" but as the mass got bigger, she brought it to her physician's attention and was set up for mammography.  This was performed May 20, 2008 at [facility] and it showed a palpable 8.5 cm oval circumscribed mass in the left breast upper outer quadrant.  There were prominent left axillary lymph nodes noted as well.  An ultrasound showed this to be hypoechoic, with lobulated margins and probable central necrosis measuring up to 7.3 cm on this modality.  Ultrasound of the left axilla demonstrated numerous prominent lymph nodes at the upper limit of normal, the largest measuring 1 cm with a normal appearing thin cortex and a normal cortex to halo ratio.    Because the patient had no insurance, she was referred to [facility] and biopsy was performed in [place] with the pathology there ([id number]) showing a poorly differentiated malignancy which was triple negative (ER 1%, PR 0%, HER-2/neu 0).  The axillary lymph nodes were not biopsied. With this information, the patient was presented at the Breast Multidisciplinary Conference and the feeling was that, after appropriate staging, the patient would be a good candidate for neoadjuvant treatment and possibly NSABP B40.    Her subsequent history is as detailed below  INTERVAL HISTORY: Erica [name] returns today for follow-up of her estrogen receptor negative breast cancer. She was receiving goserelin monthly here, but after the March dose she tells me she was not able  to get any new appointments even though she tried several times. She is scheduled for dose today.  She had an echocardiogram 09/18/2015 showing an ejection fraction of 65-70%. She had a ventriculoperitoneal shunt placement recently revised with head CT 01/31/2016 showing no acute intracranial pathology.  REVIEW OF SYSTEMS:  she wears oxygen at a lot of the time and of course has CPAP at home. She has headaches that she says due to the shunt. She takes Tylenol for this and requested some tramadol. She has pain in her right hip area, or more in the right lower quadrant of the abdomen. This is not related to movement or 2 bowel movements are to eating. It is both sharp and burning it is very intermittent. She is tired all the time. She has problems sleeping despite a her trazodone. She is on Elavil for proliferative full neuropathy but this is not working well either. A detailed review of systems today was otherwise stable   PAST MEDICAL HISTORY: Past Medical History:  Diagnosis Date  . Anemia, unspecified 04/13/2011  . Asthma   . Asthma 09/27/2010  . Bloating 03/18/2011  . Breast cancer ([place]) 2010   a. locally advanced left breast carcinoma diagnosed in 2010 and treated with neoadjuvant chemotherapy with docetaxel and cyclophosphamide as well as paclitaxel. This was followed by radiation therapy which was completed in 2011.  Marland Kitchen Dyspnea on exertion    a. 01/2013 Lexi MV: EF 54%, no ischemia/infarct;  b. 05/2013 Echo: EF 60-65%, no rwma, Gr 2 DD;  b.   . Fatty liver   .  Gastroparesis   . GERD (gastroesophageal reflux disease)   . Hypertension   . Impaired glucose tolerance 04/13/2011  . Lymphadenitis, chronic    restricted LEFT extremity  . 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 OSA (obstructive sleep apnea) 05/04/2014   AHI 31/hr now on CPAP at 12cm H2O    PAST SURGICAL HISTORY: Past Surgical History:  Procedure Laterality Date  . BRAIN SURGERY     Shunt  . BREAST LUMPECTOMY Left 09/2008   . CARPAL TUNNEL RELEASE Left 2011  . LYMPH NODE DISSECTION Left 2010   breast; 2 wks after breast lumpectomy  . TENDON RELEASE Right 2012   "hand"  . TUBAL LIGATION  05/11/2011   Procedure: POST PARTUM TUBAL LIGATION;  Surgeon: Emeterio Reeve, MD;  Location: [facility];  Service: Gynecology;  Laterality: Bilateral;  Induced for HTN    FAMILY HISTORY Family History  Problem Relation Age of Onset  . Other Mother 2       breast calcifications treated with surgery, breast cancer pill, and radiation  . Fibroids Sister 42       s/p TAH-BSO  . Diabetes Maternal Grandmother   . Heart disease Maternal Grandmother   . Hypertension Maternal Grandmother   . Breast cancer Other        maternal great aunt (MGM's sister)  . Breast cancer Other        paternal great aunt dx middle ages; s/p mastectomy  . Cancer Neg Hx   . Alcohol abuse Neg Hx   . Early death Neg Hx   . Hyperlipidemia Neg Hx   . Kidney disease Neg Hx   . Stroke Neg Hx   . Colon cancer Neg Hx   The patient's parents are alive. The patient has one brother and one sister.  There is no ovarian or breast cancer in the immediate family, but the patient's maternal grandmother, who was one of 37 sisters, had one sister with breast cancer diagnosed in her 3.    GYNECOLOGIC HISTORY: updated January 2015 The patient is GX P4, first pregnancy to term at age 58.  She stopped having periods at the time of chemotherapy, and had had no periods for  2 years, then became pregnant in 2012. This was a surprise to her, and she was not aware of the pregnancy until the seventh month. She has not resumed normal menses, but has had some "spotting" on and off for the last several months.  SOCIAL HISTORY: updated  January 2015  She has been a Dance movement psychotherapist in the past. She's currently disabled. she splits her time between [place] and [place], [place]. She's currently single. Erica [name] has four children,  currently 68 year old, 44 years old, 41 and 43  years old. Her oldest daughter is attending college in [place].   Her youngest son is almost 2. 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 Smoking status: Never Smoker  . Smokeless tobacco: Never Used  . Alcohol use 0.0 oz/week     Comment: occasionally/socially     Colonoscopy: Never  PAP: June 2013  Bone density:  Never  Lipid panel: Nov 2014, Dr. Meda Coffee   Allergies  Allergen Reactions  . Aspirin Shortness Of Breath and Palpitations    Pt can take ibuprofen without reaction  . Bactrim [Sulfamethoxazole-Trimethoprim] Anaphylaxis  . Doxycycline Anaphylaxis  . Penicillins Shortness Of  Breath and Palpitations    Has patient had a PCN reaction causing immediate rash, facial/tongue/throat swelling, SOB or lightheadedness with hypotension: Yes Has patient had a PCN reaction causing severe rash involving mucus membranes or skin necrosis: Yes Has patient had a PCN reaction that required hospitalization Yes Has patient had a PCN reaction occurring within the last 10 years: No If all of the above answers are "NO", then may proceed with Cephalosporin use.  Severiano Gilbert Extract Itching and Swelling    Lip swelling  . Strawberry Extract Hives    Current Outpatient Prescriptions  Medication Sig Dispense Refill  . acetaZOLAMIDE (DIAMOX) 250 MG tablet Take 4 tablets (1,000 mg total) by mouth 3 (three) times daily. 400 tablet 11  . albuterol (PROVENTIL) (2.5 MG/3ML) 0.083% nebulizer solution USE 1 VIAL VIA NEBULIZER EVERY 4 HOURS AS NEEDED( NEED APPOINTMENT) 300 mL 2  . amitriptyline (ELAVIL) 100 MG tablet Take 100 mg by mouth at bedtime.    Marland Kitchen amLODipine (NORVASC) 10 MG tablet TAKE 1 TABLET BY MOUTH ONCE DAILY 90 tablet 0  . Biotin 10000 MCG TABS Take 1 tablet by mouth daily.    . chlorpheniramine (CHLOR-TRIMETON) 4 MG tablet Take 8 mg by mouth at bedtime.    . chlorthalidone (HYGROTON) 25  MG tablet Take 1 tablet (25 mg total) by mouth daily. 90 tablet 2  . cloNIDine (CATAPRES) 0.3 MG tablet TAKE 1 TABLET BY MOUTH TWICE A DAY 180 tablet 1  . DEXILANT 60 MG capsule TAKE 1 CAPSULE BY MOUTH ONCE DAILY 30 capsule 3  . dextromethorphan-guaiFENesin (MUCINEX DM) 30-600 MG 12hr tablet Take 1 tablet by mouth 2 (two) times daily as needed for cough.    . dicyclomine (BENTYL) 20 MG tablet TAKE 1 TABLET BY MOUTH 3 TIMES DAILY AS NEEDED FOR SPASMS 90 tablet 1  . DULoxetine (CYMBALTA) 60 MG capsule Take 1 capsule (60 mg total) by mouth daily. 90 capsule 3  . EPINEPHrine 0.3 mg/0.3 mL IJ SOAJ injection Inject 0.3 mLs (0.3 mg total) into the muscle once as needed (anaphylaxis reaction). 2 Device 5  . fluticasone (FLONASE) 50 MCG/ACT nasal spray Place 2 sprays into both nostrils daily. 16 g 0  . furosemide (LASIX) 20 MG tablet Take 20 mg by mouth daily as needed.    . hydrALAZINE (APRESOLINE) 100 MG tablet TAKE 1 TABLET(100 MG) BY MOUTH THREE TIMES DAILY 90 tablet 0  . lidocaine-prilocaine (EMLA) cream Apply 1 application topically as needed. 30 g 0  . loratadine (CLARITIN) 10 MG tablet Take 1 tablet (10 mg total) by mouth daily. 30 tablet 0  . MATZIM LA 420 MG 24 hr tablet TAKE 1 TABLET BY MOUTH ONCE DAILY 90 tablet 1  . metoCLOPramide (REGLAN) 10 MG tablet TAKE 1 TABLET BY MOUTH FOUR TIMES DAILY AT BEDTIME WITH MEALS 120 tablet 5  . mometasone (NASONEX) 50 MCG/ACT nasal spray Place 2 sprays into the nose 2 (two) times daily. 17 g 5  . montelukast (SINGULAIR) 10 MG tablet Take 1 tablet (10 mg total) by mouth at bedtime. 30 tablet 5  . Nebivolol HCl (BYSTOLIC) 20 MG TABS 2 tabs by mouth per day (Patient taking differently: 1 tab by mouth per day) 180 tablet 3  . NITROSTAT 0.4 MG SL tablet PLACE 1 TABLET UNDER TONGUE EVERY 5 MINUTES AS NEEDED FOR CHEST PAIN, MAX OF 3 TABLETS THEN CALL 911 IF PAIN PERSISTS 100 tablet 0  . ondansetron (ZOFRAN) 4 MG tablet Take 4 mg by mouth every 6 (  six) hours as needed  for nausea or vomiting.    . OXYGEN Use CPAP at bedtime    . potassium chloride SA (K-DUR,KLOR-CON) 20 MEQ tablet Take 2 tablets (40 mEq total) by mouth daily. (Patient taking differently: Take 20 mEq by mouth 2 (two) times daily. ) 180 tablet 2  . pregabalin (LYRICA) 200 MG capsule Take 200 mg by mouth 3 (three) times daily.    . primidone (MYSOLINE) 50 MG tablet Take 1/2 tab daily    . QVAR 80 MCG/ACT inhaler INHALE 2 PUFFS INTO LUNGS 2 TIMES A DAY AS DIRECTED 8.7 g 5  . RANEXA 500 MG 12 hr tablet TAKE 1 TABLET BY MOUTH TWICE DAILY 180 tablet 1  . Respiratory Therapy Supplies (FLUTTER) DEVI Use as directed 1 each 0  . sodium chloride (OCEAN) 0.65 % SOLN nasal spray Place 1 spray into both nostrils as needed for congestion.    Marland Kitchen spironolactone (ALDACTONE) 100 MG tablet Take 1 tablet (100 mg total) by mouth daily. 90 tablet 1  . SYMBICORT 160-4.5 MCG/ACT inhaler INHALE 2 PUFFS INTO LUNGS 2 TIMES DAILY AS DIRECTED 10.2 Inhaler 5  . topiramate (TOPAMAX) 50 MG tablet Take 1 tablet in the monring and 2 at bedtime. (Patient taking differently: 25 mg 2 (two) times daily. Take 1 tablet in the monring and 2 at bedtime.) 270 tablet 3  . traMADol (ULTRAM) 50 MG tablet Take 1 tablet (50 mg total) by mouth every 6 (six) hours as needed. 60 tablet 4  . traZODone (DESYREL) 150 MG tablet TAKE 1 TABLET BY MOUTH AT BEDTIME 90 tablet 1  . valsartan (DIOVAN) 160 MG tablet Take 2 tablets (320 mg total) by mouth daily. 90 tablet 2  . VENTOLIN HFA 108 (90 Base) MCG/ACT inhaler INHALE 2 PUFFS INTO THE LUNGS EVERY 4 HOURS AS NEEDED FOR WHEEZING OR SHORTNESS OF BREATH 18 Inhaler 1   No current facility-administered medications for this visit.     OBJECTIVE:  Young African-American woman In no acute distress  Vitals:   10/23/16 1525  BP: (!) 179/105  Pulse: (!) 103  Resp: 17  Temp: 98.7 F (37.1 C)     Body mass index is 32.28 kg/m.    ECOG FS: 2 Filed Weights   10/23/16 1525  Weight: 200 lb (90.7 kg)    Sclerae unicteric, pupils round and equal Oropharynx clear and moist No cervical or supraclavicular adenopathy Lungs no rales or rhonchi Heart regular rate and rhythm Abd soft, nontender, positive bowel sounds MSK no focal spinal tenderness, no upper extremity lymphedema Neuro: nonfocal, well oriented, appropriate affect Breasts: The right breast is benign. Left breast is status post lumpectomy followed by radiation with no evidence of local recurrence. Both axillae are benign.  LAB RESULTS: Lab Results  Component Value Date   WBC 7.1 10/23/2016   NEUTROABS 4.6 10/23/2016   HGB 13.3 10/23/2016   HCT 39.6 10/23/2016   MCV 88.8 10/23/2016   PLT 189 10/23/2016      Chemistry      Component Value Date/Time   NA 140 10/23/2016 1505   K 3.4 (L) 10/23/2016 1505   CL 102 10/15/2016 1726   CL 103 09/10/2012 1618   CO2 28 10/23/2016 1505   BUN 11.9 10/23/2016 1505   CREATININE 1.1 10/23/2016 1505      Component Value Date/Time   CALCIUM 9.3 10/23/2016 1505   ALKPHOS 76 10/23/2016 1505   AST 24 10/23/2016 1505   ALT 26 10/23/2016 1505  BILITOT 0.44 10/23/2016 1505        STUDIES: [facility]2016  ASSESSMENT: 41 y.o. [place] woman with history of locally advanced left breast carcinoma initially presenting in March 2010   (1) treated neoadjuvantly with 4 cycles of docetaxel/ cyclophosphamide.    (2) status post left lumpectomy and sentinel lymph node dissection in July 2010 for a ypT2 ypN1, stage IIB invasive ductal carcinoma, grade 3, triple negative, with MIB-1 of 88%.   (3) status post full axillary dissection and margin clearance in August 2010.   (4) status post 11 doses of weekly paclitaxel completed in December 2010   (5) radiation therapy completed in March 2011.  Now followed with observation alone.   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 This is very favorable.  She has missed several Zoladex appointments and has had a. She tells me. I am scheduling her every 28 days for the next year. She preferred to continue with the monthly shots rather than the every 3 month shots.  For pain I am prescribing tramadol. She tells me does not work to well but it does work somewhat well for her and it doesn't cause her constipation.  I am not sure what the cause of the discomfort in her right lower quadrant is, but it is very intermittent, possibly related to her earlier laparoscopic surgery. We discussed the fact that cancer pain tends to be relentless and progressive which this is not.  Otherwise she will continue under the excellent care of her primary care physician. She is already scheduled for mammography the second week in August this year and again next year.   Chauncey Cruel, MD    10/23/2016

## 2016-10-24 ENCOUNTER — Encounter: Payer: Self-pay | Admitting: Oncology

## 2016-10-24 NOTE — Progress Notes (Signed)
Received PA request from [facility].  Submitted through Cover My Meds.   Erica [name] (Key: [id number])   Your information has been submitted to [place] Medicare Part D. [employer] Part D will review the request and will issue a decision, typically within 1-3 days from your submission. You can check the updated outcome later by reopening this request. If [employer] has not responded in 1-3 days or if you have any questions about your ePA request, please contact [place] Medicare Part D at [phone]. If you think there may be a problem with your PA request, use our live chat feature at the bottom right.

## 2016-10-25 ENCOUNTER — Other Ambulatory Visit: Payer: Self-pay | Admitting: Internal Medicine

## 2016-10-25 ENCOUNTER — Encounter: Payer: Self-pay | Admitting: Oncology

## 2016-10-25 ENCOUNTER — Ambulatory Visit (INDEPENDENT_AMBULATORY_CARE_PROVIDER_SITE_OTHER): Payer: Medicare Other | Admitting: Neurology

## 2016-10-25 ENCOUNTER — Encounter: Payer: Self-pay | Admitting: Neurology

## 2016-10-25 VITALS — BP 140/90 | HR 64 | Ht 66.0 in | Wt 199.2 lb

## 2016-10-25 DIAGNOSIS — G932 Benign intracranial hypertension: Secondary | ICD-10-CM

## 2016-10-25 DIAGNOSIS — G250 Essential tremor: Secondary | ICD-10-CM

## 2016-10-25 DIAGNOSIS — G620 Drug-induced polyneuropathy: Secondary | ICD-10-CM

## 2016-10-25 DIAGNOSIS — T451X5A Adverse effect of antineoplastic and immunosuppressive drugs, initial encounter: Secondary | ICD-10-CM

## 2016-10-25 MED ORDER — DULOXETINE HCL 30 MG PO CPEP: 30.0000 mg | ORAL_CAPSULE | Freq: Every day | ORAL | 3 refills | Status: DC

## 2016-10-25 MED ORDER — DULOXETINE HCL 60 MG PO CPEP: 60.0000 mg | ORAL_CAPSULE | Freq: Every day | ORAL | 3 refills | Status: DC

## 2016-10-25 MED ORDER — PREGABALIN 200 MG PO CAPS: 200.0000 mg | ORAL_CAPSULE | Freq: Three times a day (TID) | ORAL | 5 refills | Status: DC

## 2016-10-25 MED ORDER — ACETAZOLAMIDE 250 MG PO TABS: 1000.0000 mg | ORAL_TABLET | Freq: Three times a day (TID) | ORAL | 3 refills | Status: DC

## 2016-10-25 MED ORDER — PRIMIDONE 50 MG PO TABS: 25.0000 mg | ORAL_TABLET | Freq: Every day | ORAL | 3 refills | Status: DC

## 2016-10-25 NOTE — Patient Instructions (Signed)
Increase Cymbalta to 90mg  daily Continue your other medications as your are taking them Follow-up with your [facility] headache specialist and ophthalmologist at [facility] for visual field testing and assess VP shunt We will place a referral to Pain Management   Return to clinic in 6 months

## 2016-10-25 NOTE — Progress Notes (Signed)
Received PA determination from [place].  PA approved for Lidocaine-Prilocaine cream 07/26/16-10/24/17.  [facility]axed copy to [email or web address] [phone]. Fax received ok per confirmation sheet.

## 2016-10-25 NOTE — Progress Notes (Signed)
Follow-up Visit   Date: 10/25/16    Erica [name] MRN: [id number] DOB: July 25, 1975   Interim History: Erica [name] is a 41 y.o. 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 ([place] asthma, and GERD returning to the clinic for follow-up of pseudotumor cerebri, tremors, and neuropathy.    History of present illness: Starting in 2010 after her second course of chemotherapy she developed numbness/tingling of the arm and legs, described as her hands are asleep. She has burning sensation and shaking of her hands. Activity worsens pain and she has difficulty with fine motor tasks such as buttoning, tying shoe laces, and braiding her daughter's hair. She falls about once per month because her legs give out. She was started on Lyrica 50mg  TID which has been titrated to 200mg  TID.  She reports having spells of syncope, which seems to only occur when she is sitting. There is loss of consciousness, lasting only a few seconds.  Following the spells, there is no fatigue, confusion, or weakness. On one ocassion, she felt herself leaning to the side and caught herself when at therapy. She is unaware of the spells.  Routine EEG was normal.  MRI/V brain has been normal.   She history of refractory pseudotumor cerebri and has been on diamox, topirmate, and lasix.  Additionally, she continues to have 1-2 elective high volume taps annually for headaches.  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 In 2016, her ophthalmologist, Dr. Vista Mink noticed new findings of severe left temporal visual field deficit involving her left eye (good eye). Her imaging did not disclose any intracranial  abnormality to account for symptoms.  She underwent LP with OP of 27cm of water.  Since her LP, she reports feeling as if her vision is better and eyes don't feel as foggy anymore.   She was evaluated by neurosurgery for shunt evaluation, but because of no papilledema, did not feel that she would be a good candidate.  She was seen by Dr. by Darrin Luis, opthalmology at [facility] in early 2017, who noted severe nerve fiber layer loss stating that papilledema is not a good marker for elevated ICP because she has such few nerve fibers.    In July 2017, she sought opinion of Dr. Theda Sers at [facility] who recommended shunting as the only option to control headaches and preserve vision.   - UPDATE 04/29/2016:  In October, she had VP shunt placed at [facility] but has not noticed any improvement with her headaches.  In fact, she complains of worsening headaches, especially when coughing or sneezing, giving a band-like pressure over the forehead.  She also complains of vomiting and coughing since the shunt was placed.  CT brain in Dec 2017 shows stable shunt placement.  Because of inclement weather, her follow-up with opthalmology was cancelled.  She is on Diamox 1000mg  TID and topirmate 50mg  BID.  She also has worsening painful neuropathy of the hands and feet.  She takes Lyrica 200mg  TID and Cymbalta 60mg  daily. Her tremors are better in her right hand, but left hand continues to shake often.  She takes primidone 25mg  daily. Of note, she was started on Ranexa for chest pain and hypertension.  UPDATE 10/25/2016:  She is here for 6 month appointment.  Unfortunately, she continues to have ongoing issues with headaches due to pseudotumor cerebri and worsening neuropathy of the hands and feet. She has not seen her neurosurgeon or opthalmologist at [facility] which was recommended at the last visit.  She is scheduled to see opthalmology in August and is overdue to visual field testing.  She remains on diamox 1000mg  daily and topiramte  150mg /d.  She is taking amitriptyline 100mg  at bedtime for insomnia, Cymbalta 60mg  and Lyrica 200mg  BID for neuropathy.  Despite all these medication, her neuropathy remains unchanged and she continues to have burning paresthesias.   Medications:  Current Outpatient Prescriptions on File Prior to Visit  Medication Sig Dispense Refill  . albuterol (PROVENTIL) (2.5 MG/3ML) 0.083% nebulizer solution USE 1 VIAL VIA NEBULIZER EVERY 4 HOURS AS NEEDED( NEED APPOINTMENT) 300 mL 2  . amitriptyline (ELAVIL) 100 MG tablet Take 100 mg by mouth at bedtime.    Marland Kitchen amLODipine (NORVASC) 10 MG tablet TAKE 1 TABLET BY MOUTH ONCE DAILY 90 tablet 0  . Biotin 10000 MCG TABS Take 1 tablet by mouth daily.    . chlorpheniramine (CHLOR-TRIMETON) 4 MG tablet Take 8 mg by mouth at bedtime.    . chlorthalidone (HYGROTON) 25 MG tablet Take 1 tablet (25 mg total) by mouth daily. 90 tablet 2  . cloNIDine (CATAPRES) 0.3 MG tablet TAKE 1 TABLET BY MOUTH TWICE A DAY 180 tablet 1  . DEXILANT 60 MG capsule TAKE 1 CAPSULE BY MOUTH ONCE DAILY 30 capsule 3  . dextromethorphan-guaiFENesin (MUCINEX DM) 30-600 MG 12hr tablet Take 1 tablet by mouth 2 (two) times daily as needed for cough.    . dicyclomine (BENTYL) 20 MG tablet TAKE 1 TABLET BY MOUTH 3 TIMES DAILY AS NEEDED FOR SPASMS 90 tablet 1  . EPINEPHrine 0.3 mg/0.3 mL IJ SOAJ injection Inject 0.3 mLs (0.3 mg total) into the muscle once as needed (anaphylaxis reaction). 2 Device 5  . fluticasone (FLONASE) 50 MCG/ACT nasal spray Place 2 sprays into both nostrils daily. 16 g 0  . furosemide (LASIX) 20 MG tablet Take 20 mg by mouth daily as needed.    . hydrALAZINE (APRESOLINE) 100 MG tablet TAKE 1 TABLET(100 MG) BY MOUTH THREE TIMES DAILY 90 tablet 0  . lidocaine-prilocaine (EMLA) cream Apply 1 application topically as needed. 30 g 0  . loratadine (CLARITIN) 10 MG tablet Take 1 tablet (10 mg total) by mouth daily. 30 tablet 0  . MATZIM LA 420 MG 24 hr tablet TAKE 1 TABLET BY MOUTH ONCE  DAILY 90 tablet 1  . metoCLOPramide (REGLAN) 10 MG tablet TAKE 1 TABLET BY MOUTH FOUR TIMES DAILY AT BEDTIME WITH MEALS 120 tablet 5  . mometasone (NASONEX) 50 MCG/ACT nasal spray Place 2 sprays into the nose 2 (two) times daily. 17 g 5  . montelukast (SINGULAIR) 10 MG tablet Take 1 tablet (10 mg total) by mouth at bedtime. 30 tablet 5  . Nebivolol HCl (BYSTOLIC) 20 MG TABS 2 tabs by mouth per day (Patient taking differently: 1 tab by mouth per day) 180 tablet 3  . NITROSTAT 0.4 MG SL tablet PLACE 1 TABLET UNDER TONGUE EVERY 5 MINUTES AS NEEDED FOR CHEST PAIN, MAX OF 3 TABLETS THEN CALL 911 IF PAIN PERSISTS 100 tablet 0  . ondansetron (ZOFRAN) 4 MG tablet Take 4 mg by mouth every 6 (six) hours as needed for nausea or vomiting.    . OXYGEN Use CPAP at bedtime    . potassium chloride SA (K-DUR,KLOR-CON) 20 MEQ tablet Take  2 tablets (40 mEq total) by mouth daily. (Patient taking differently: Take 20 mEq by mouth 2 (two) times daily. ) 180 tablet 2  . QVAR 80 MCG/ACT inhaler INHALE 2 PUFFS INTO LUNGS 2 TIMES A DAY AS DIRECTED 8.7 g 5  . RANEXA 500 MG 12 hr tablet TAKE 1 TABLET BY MOUTH TWICE DAILY 180 tablet 1  . Respiratory Therapy Supplies (FLUTTER) DEVI Use as directed 1 each 0  . sodium chloride (OCEAN) 0.65 % SOLN nasal spray Place 1 spray into both nostrils as needed for congestion.    Marland Kitchen spironolactone (ALDACTONE) 100 MG tablet Take 1 tablet (100 mg total) by mouth daily. 90 tablet 1  . SYMBICORT 160-4.5 MCG/ACT inhaler INHALE 2 PUFFS INTO LUNGS 2 TIMES DAILY AS DIRECTED 10.2 Inhaler 5  . topiramate (TOPAMAX) 50 MG tablet Take 1 tablet in the monring and 2 at bedtime. (Patient taking differently: 25 mg 2 (two) times daily. Take 1 tablet in the monring and 2 at bedtime.) 270 tablet 3  . traMADol (ULTRAM) 50 MG tablet Take 1 tablet (50 mg total) by mouth every 6 (six) hours as needed. 60 tablet 4  . traZODone (DESYREL) 150 MG tablet TAKE 1 TABLET BY MOUTH AT BEDTIME 90 tablet 1  . valsartan  (DIOVAN) 160 MG tablet Take 2 tablets (320 mg total) by mouth daily. 90 tablet 2  . VENTOLIN HFA 108 (90 Base) MCG/ACT inhaler INHALE 2 PUFFS INTO THE LUNGS EVERY 4 HOURS AS NEEDED FOR WHEEZING OR SHORTNESS OF BREATH 18 Inhaler 1   No current facility-administered medications on file prior to visit.     Allergies:  Allergies  Allergen Reactions  . Aspirin Shortness Of Breath and Palpitations    Pt can take ibuprofen without reaction  . Bactrim [Sulfamethoxazole-Trimethoprim] Anaphylaxis  . Doxycycline Anaphylaxis  . Kiwi Extract Itching and Swelling    Lip swelling  . Penicillins Shortness Of Breath and Palpitations    Has patient had a PCN reaction causing immediate rash, facial/tongue/throat swelling, SOB or lightheadedness with hypotension: Yes Has patient had a PCN reaction causing severe rash involving mucus membranes or skin necrosis: Yes Has patient had a PCN reaction that required hospitalization Yes Has patient had a PCN reaction occurring within the last 10 years: No If all of the above answers are "NO", then may proceed with Cephalosporin use.  [place] Extract Hives     Review of Systems:  CONSTITUTIONAL: No fevers, chills, night sweats, or weight loss.   EYES: +visual changes or eye pain ENT: No hearing changes.  No history of nose bleeds.   RESPIRATORY: +cough, wheezing and shortness of breath.   CARDIOVASCULAR: Negative for chest pain, and palpitations.   GI: Negative for abdominal discomfort, blood in stools or black stools.  No recent change in bowel habits.   GU:  No history of incontinence.   MUSCLOSKELETAL: No history of joint pain or swelling.  +myalgias.   SKIN: Negative for lesions, rash, and itching.   ENDOCRINE: Negative for cold or heat intolerance, polydipsia or goiter.   PSYCH:  +depression or anxiety symptoms.   NEURO: As Above.   Vital Signs:  BP 140/90   Pulse 64   Ht 5\' 6"  (1.676 m)   Wt 199 lb 3 oz (90.4 kg)   BMI 32.15 kg/m      Neurological Exam: MENTAL STATUS including orientation to time, place, person is normal.    CRANIAL NERVES:  She is blind in the right eye - only  perceives light.  Visual field testing of the left eye is shows reduced peripheral vision on the left lower temporal field.  Pupils equal round and reactive to light.  Face is symmetric.   MOTOR: Mild intention tremor on the bilaterally. No pronator drift.  Tone is normal.   Motor strength is 5/5 throughout except L dorsiflexion and toe extension is 5-/5  MSRs: Reflexes are 2+/4 throughout.  SENSORY:  Intact to vibration and temperature throughout  COORDINATION/GAIT:   Gait narrow based, slow, slightly unsteady, unassisted.   Data: [facility], Dr. 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  MRI brain wwo contrast 05/18/2015: Stable MRI appearance of the brain since 2016 with moderate for age nonspecific white matter signal changes. No acute intracranial abnormality.  MRV 06/30/2015:  No evidence of venous sinus thrombosis. No focal narrowing of the transverse-sigmoid sinus junction, as can be seen in the setting of idiopathic intracranial hypotension.  MRI cervical spine wo contrast 06/07/2015: 1. Small right paracentral/foraminal disc protrusion at T2-3, closely approximating the exiting right T2 nerve root. This could potentially produce right-sided radicular symptoms. 2. Mild right foraminal narrowing at C4-5 and C5-6 related to uncovertebral hypertrophy. No other significant right-sided foraminal narrowing within the cervical spine. 3. Small central disc protrusion at C6-7 without significant stenosis. 4. Additional tiny central disc protrusion at C2-3 without stenosis. 5. Additional minimal multilevel degenerative changes as above.  CSF 09/18/2011: R269 W0 G65 P17, CSF cytology-neg OP 43, 32cc removed  LP 02/05/2013: OP 18, CP 17 after 21 cc  removed  LP 10/25/2013:  OP 26, CP 5 after 15 cc removed LP 07/29/2014:  OP 27, CP 16 after 18cc removed LP 02/07/2015:  OP 27, CP 12 after 22cc removed LP 05/23/2015:  OP 25, CP 11 after 25cc removed LP 10/19/2015:  OP 26.5, CP 10 after 32cc removed  Routine EEG 06/28/2014:  Normal  CT head 03/04/2016:  No acute intracranial abnormalities. Stable appearance of the shunted ventricular system.  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  She had not had any follow-up to assess her shunt with [facility] neurosurgery or opthalmology which was recommended at her last visit in January. 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  Clinically worsening. Continue Lyrica to 200mg  TID  Increase Cymbalta 90mg  daily Amitriptyline 100mg  qhs is being prescribed for insomnia by her PCP.   Referral to Pain Management for other options  Benign essential tremors, stable Continue primidone to 25mg  twice daily.  Cautious with titrating primidone as it reduces the effective dose of Ranexa Unable to use beta blockers with her history of asthma  Return to clinic in 6 months   The duration of this appointment visit was 30 minutes of face-to-face time with the patient.  Greater than 50% of this time was spent in counseling, explanation of diagnosis, planning of further management, and coordination of care.   Thank you for allowing me to participate in patient's care.  If I can answer any additional questions, I would be pleased to do so.    Sincerely,    Farhana Fellows K.  Posey Pronto, DO

## 2016-10-28 ENCOUNTER — Other Ambulatory Visit: Payer: Self-pay | Admitting: Internal Medicine

## 2016-10-28 DIAGNOSIS — N938 Other specified abnormal uterine and vaginal bleeding: Secondary | ICD-10-CM

## 2016-10-30 ENCOUNTER — Telehealth: Payer: Self-pay | Admitting: Internal Medicine

## 2016-10-30 MED ORDER — FLUTICASONE PROPIONATE HFA 110 MCG/ACT IN AERO: 2.0000 | INHALATION_SPRAY | Freq: Two times a day (BID) | RESPIRATORY_TRACT | 12 refills | Status: DC

## 2016-10-30 NOTE — Telephone Encounter (Signed)
Pt called back and she is aware of rx for the flovent that has been sent to her pharmacy. Nothing further is needed.

## 2016-10-30 NOTE — Telephone Encounter (Signed)
Received fax from pharm stating that qvar not covered  Flovent is preferred Per MW- call in Flovent 110 mcg 2 puffs bid  LMTCB for pt  Will send med once she calls back

## 2016-10-31 ENCOUNTER — Ambulatory Visit
Admission: RE | Admit: 2016-10-31 | Discharge: 2016-10-31 | Disposition: A | Discharge status: Home / Self Care | Payer: Medicare Other | Source: Ambulatory Visit | Attending: Internal Medicine | Admitting: Internal Medicine

## 2016-10-31 ENCOUNTER — Other Ambulatory Visit: Payer: Self-pay | Admitting: *Deleted

## 2016-10-31 DIAGNOSIS — T451X5A Adverse effect of antineoplastic and immunosuppressive drugs, initial encounter: Principal | ICD-10-CM

## 2016-10-31 DIAGNOSIS — R29898 Other symptoms and signs involving the musculoskeletal system: Secondary | ICD-10-CM

## 2016-10-31 DIAGNOSIS — N938 Other specified abnormal uterine and vaginal bleeding: Secondary | ICD-10-CM

## 2016-10-31 DIAGNOSIS — N912 Amenorrhea, unspecified: Secondary | ICD-10-CM

## 2016-10-31 DIAGNOSIS — G620 Drug-induced polyneuropathy: Secondary | ICD-10-CM

## 2016-10-31 DIAGNOSIS — G932 Benign intracranial hypertension: Secondary | ICD-10-CM

## 2016-10-31 DIAGNOSIS — R531 Weakness: Secondary | ICD-10-CM

## 2016-10-31 DIAGNOSIS — N838 Other noninflammatory disorders of ovary, fallopian tube and broad ligament: Secondary | ICD-10-CM | POA: Diagnosis not present

## 2016-10-31 DIAGNOSIS — R292 Abnormal reflex: Secondary | ICD-10-CM

## 2016-11-01 ENCOUNTER — Other Ambulatory Visit: Payer: Self-pay | Admitting: Internal Medicine

## 2016-11-01 ENCOUNTER — Encounter: Payer: Self-pay | Admitting: Internal Medicine

## 2016-11-01 ENCOUNTER — Telehealth: Payer: Self-pay | Admitting: Internal Medicine

## 2016-11-01 DIAGNOSIS — N938 Other specified abnormal uterine and vaginal bleeding: Secondary | ICD-10-CM

## 2016-11-01 NOTE — Telephone Encounter (Signed)
Spoke with [facility]: Confirmed Impressions.   Recommended - GYN consultation due to findings.

## 2016-11-01 NOTE — Telephone Encounter (Signed)
GYN referral ordered

## 2016-11-01 NOTE — Telephone Encounter (Signed)
Call report  

## 2016-11-05 ENCOUNTER — Encounter (HOSPITAL_COMMUNITY): Payer: Self-pay | Admitting: Student

## 2016-11-05 ENCOUNTER — Inpatient Hospital Stay (HOSPITAL_COMMUNITY)
Admission: AD | Admit: 2016-11-05 | Discharge: 2016-11-05 | Disposition: A | Discharge status: Home / Self Care | Payer: Medicare Other | Source: Ambulatory Visit | Attending: Family Medicine | Admitting: Family Medicine

## 2016-11-05 DIAGNOSIS — Z880 Allergy status to penicillin: Secondary | ICD-10-CM | POA: Insufficient documentation

## 2016-11-05 DIAGNOSIS — Z886 Allergy status to analgesic agent status: Secondary | ICD-10-CM | POA: Diagnosis not present

## 2016-11-05 DIAGNOSIS — Z3202 Encounter for pregnancy test, result negative: Secondary | ICD-10-CM | POA: Insufficient documentation

## 2016-11-05 DIAGNOSIS — Z853 Personal history of malignant neoplasm of breast: Secondary | ICD-10-CM | POA: Insufficient documentation

## 2016-11-05 DIAGNOSIS — N939 Abnormal uterine and vaginal bleeding, unspecified: Secondary | ICD-10-CM | POA: Insufficient documentation

## 2016-11-05 LAB — CBC
HEMATOCRIT: 36.3 % (ref 36.0–46.0)
HEMOGLOBIN: 12.4 g/dL (ref 12.0–15.0)
MCH: 30.2 pg (ref 26.0–34.0)
MCHC: 34.2 g/dL (ref 30.0–36.0)
MCV: 88.5 fL (ref 78.0–100.0)
Platelets: 210 10*3/uL (ref 150–400)
RBC: 4.1 MIL/uL (ref 3.87–5.11)
RDW: 14.2 % (ref 11.5–15.5)
WBC: 7.1 10*3/uL (ref 4.0–10.5)

## 2016-11-05 LAB — URINALYSIS, ROUTINE W REFLEX MICROSCOPIC

## 2016-11-05 LAB — URINALYSIS, MICROSCOPIC (REFLEX)

## 2016-11-05 LAB — POCT PREGNANCY, URINE: PREG TEST UR: NEGATIVE

## 2016-11-05 MED ORDER — MEGESTROL ACETATE 20 MG PO TABS: 40.0000 mg | ORAL_TABLET | Freq: Two times a day (BID) | ORAL | 0 refills | Status: DC

## 2016-11-05 NOTE — MAU Provider Note (Signed)
History     CSN: [id number]  Arrival date and time: 11/05/16 1444   First Provider Initiated Contact with Patient 11/05/16 1542      No chief complaint on file.  HPI   Ms.Erica [name] is a 41 y.o. female [phone] here in MAU with heavy vaginal bleeding that started on Saturday.  She Is going through a pack of pads in a day; 30 or so pads in a pack.  Last normal menstrual cycle was over a year ago. States she has spotting here and there however not having normal periods. The last time she spotted was 2 month ago. The spotting lasted only a couple of days which is normal for her. When she woke up this morning her clothes were soaked with blood and the blood was leaking down her legs. She was seen by her PCP on 7/17 and had a pelvic US done on 8/2; she was told she need to follow up with a GYN.   OB History    Gravida Para Term Preterm AB Living   5 4 4   1 4    SAB TAB Ectopic Multiple Live Births   1       4      Past Medical History:  Diagnosis Date  . Asthma   . Breast cancer ([place]) 2010   a. locally advanced left breast carcinoma diagnosed in 2010 and treated with neoadjuvant chemotherapy with docetaxel and cyclophosphamide as well as paclitaxel. This was followed by radiation therapy which was completed in 2011.  Marland Kitchen Dyspnea on exertion    a. 01/2013 Lexi MV: EF 54%, no ischemia/infarct;  b. 05/2013 Echo: EF 60-65%, no rwma, Gr 2 DD;  b.   . Fatty liver   . Gastroparesis   . GERD (gastroesophageal reflux disease)   . Hypertension   . Impaired glucose tolerance 04/13/2011  . Lymphadenitis, chronic    restricted LEFT extremity  . 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 OSA (obstructive sleep apnea) 05/04/2014   AHI 31/hr now on CPAP at 12cm H2O    Past Surgical History:  Procedure Laterality Date  . BRAIN SURGERY     Shunt  . BREAST LUMPECTOMY Left 09/2008  . CARPAL TUNNEL RELEASE Left 2011  . LYMPH NODE DISSECTION Left 2010   breast; 2 wks after breast lumpectomy  .  TENDON RELEASE Right 2012   "hand"  . TUBAL LIGATION  05/11/2011   Procedure: POST PARTUM TUBAL LIGATION;  Surgeon: Emeterio Reeve, MD;  Location: [facility];  Service: Gynecology;  Laterality: Bilateral;  Induced for HTN    Family History  Problem Relation Age of Onset  . Other Mother 60       breast calcifications treated with surgery, breast cancer pill, and radiation  . Fibroids Sister 39       s/p TAH-BSO  . Diabetes Maternal Grandmother   . Heart disease Maternal Grandmother   . Hypertension Maternal Grandmother   . Breast cancer Other        maternal great aunt (MGM's sister)  . Breast cancer Other        paternal great aunt dx middle ages; s/p mastectomy  . Cancer Neg Hx   . Alcohol abuse Neg Hx   . Early death Neg Hx   . Hyperlipidemia Neg Hx   . Kidney disease Neg Hx   . Stroke Neg Hx   . Colon cancer Neg Hx     Social History  Substance Use Topics  .  Smoking status: Never Smoker  . Smokeless tobacco: Never Used  . Alcohol use 0.0 oz/week     Comment: occasionally/socially    Allergies:  Allergies  Allergen Reactions  . Aspirin Shortness Of Breath and Palpitations    Pt can take ibuprofen without reaction  . Bactrim [Sulfamethoxazole-Trimethoprim] Anaphylaxis  . Doxycycline Anaphylaxis  . Kiwi Extract Itching and Swelling    Lip swelling  . Penicillins Shortness Of Breath and Palpitations    Has patient had a PCN reaction causing immediate rash, facial/tongue/throat swelling, SOB or lightheadedness with hypotension: Yes Has patient had a PCN reaction causing severe rash involving mucus membranes or skin necrosis: Yes Has patient had a PCN reaction that required hospitalization Yes Has patient had a PCN reaction occurring within the last 10 years: No If all of the above answers are "NO", then may proceed with Cephalosporin use.  [place] Extract Hives    Prescriptions Prior to Admission  Medication Sig Dispense Refill Last Dose  . acetaZOLAMIDE (DIAMOX)  250 MG tablet Take 4 tablets (1,000 mg total) by mouth 3 (three) times daily. 1200 tablet 3   . albuterol (PROVENTIL) (2.5 MG/3ML) 0.083% nebulizer solution USE 1 VIAL VIA NEBULIZER EVERY 4 HOURS AS NEEDED( NEED APPOINTMENT) 300 mL 2 Taking  . amitriptyline (ELAVIL) 100 MG tablet Take 100 mg by mouth at bedtime.   Taking  . amLODipine (NORVASC) 10 MG tablet TAKE 1 TABLET BY MOUTH ONCE DAILY 90 tablet 0 Taking  . Biotin 10000 MCG TABS Take 1 tablet by mouth daily.   Taking  . chlorpheniramine (CHLOR-TRIMETON) 4 MG tablet Take 8 mg by mouth at bedtime.   Taking  . chlorthalidone (HYGROTON) 25 MG tablet Take 1 tablet (25 mg total) by mouth daily. 90 tablet 2 Taking  . cloNIDine (CATAPRES) 0.3 MG tablet TAKE 1 TABLET BY MOUTH TWICE A DAY 180 tablet 1 Taking  . DEXILANT 60 MG capsule TAKE 1 CAPSULE BY MOUTH ONCE DAILY 30 capsule 3 Taking  . dextromethorphan-guaiFENesin (MUCINEX DM) 30-600 MG 12hr tablet Take 1 tablet by mouth 2 (two) times daily as needed for cough.   Taking  . dicyclomine (BENTYL) 20 MG tablet TAKE 1 TABLET BY MOUTH 3 TIMES DAILY AS NEEDED FOR SPASMS 90 tablet 1 Taking  . DULoxetine (CYMBALTA) 30 MG capsule Take 1 capsule (30 mg total) by mouth daily. 90 capsule 3   . DULoxetine (CYMBALTA) 60 MG capsule Take 1 capsule (60 mg total) by mouth daily. 90 capsule 3   . EPINEPHrine 0.3 mg/0.3 mL IJ SOAJ injection Inject 0.3 mLs (0.3 mg total) into the muscle once as needed (anaphylaxis reaction). 2 Device 5 Taking  . fluticasone (FLONASE) 50 MCG/ACT nasal spray Place 2 sprays into both nostrils daily. 16 g 0 Taking  . fluticasone (FLOVENT HFA) 110 MCG/ACT inhaler Inhale 2 puffs into the lungs 2 (two) times daily. 1 Inhaler 12   . furosemide (LASIX) 20 MG tablet Take 20 mg by mouth daily as needed.   Taking  . hydrALAZINE (APRESOLINE) 100 MG tablet TAKE 1 TABLET(100 MG) BY MOUTH THREE TIMES DAILY 90 tablet 0 Taking  . lidocaine-prilocaine (EMLA) cream Apply 1 application topically as needed.  30 g 0   . loratadine (CLARITIN) 10 MG tablet Take 1 tablet (10 mg total) by mouth daily. 30 tablet 0 Taking  . MATZIM LA 420 MG 24 hr tablet TAKE 1 TABLET BY MOUTH ONCE DAILY 90 tablet 1   . metoCLOPramide (REGLAN) 10 MG tablet TAKE 1  TABLET BY MOUTH FOUR TIMES DAILY AT BEDTIME WITH MEALS 120 tablet 5 Taking  . mometasone (NASONEX) 50 MCG/ACT nasal spray Place 2 sprays into the nose 2 (two) times daily. 17 g 5 Taking  . montelukast (SINGULAIR) 10 MG tablet Take 1 tablet (10 mg total) by mouth at bedtime. 30 tablet 5 Taking  . Nebivolol HCl (BYSTOLIC) 20 MG TABS 2 tabs by mouth per day (Patient taking differently: 1 tab by mouth per day) 180 tablet 3 Taking  . NITROSTAT 0.4 MG SL tablet PLACE 1 TABLET UNDER TONGUE EVERY 5 MINUTES AS NEEDED FOR CHEST PAIN, MAX OF 3 TABLETS THEN CALL 911 IF PAIN PERSISTS 100 tablet 0 Taking  . ondansetron (ZOFRAN) 4 MG tablet Take 4 mg by mouth every 6 (six) hours as needed for nausea or vomiting.   Taking  . OXYGEN Use CPAP at bedtime   Taking  . potassium chloride SA (K-DUR,KLOR-CON) 20 MEQ tablet Take 2 tablets (40 mEq total) by mouth daily. (Patient taking differently: Take 20 mEq by mouth 2 (two) times daily. ) 180 tablet 2 Taking  . pregabalin (LYRICA) 200 MG capsule Take 1 capsule (200 mg total) by mouth 3 (three) times daily. 90 capsule 5   . primidone (MYSOLINE) 50 MG tablet Take 0.5 tablets (25 mg total) by mouth at bedtime. 45 tablet 3   . QVAR 80 MCG/ACT inhaler INHALE 2 PUFFS INTO LUNGS 2 TIMES A DAY AS DIRECTED 8.7 g 5 Taking  . RANEXA 500 MG 12 hr tablet TAKE 1 TABLET BY MOUTH TWICE DAILY 180 tablet 1   . Respiratory Therapy Supplies (FLUTTER) DEVI Use as directed 1 each 0 Taking  . sodium chloride (OCEAN) 0.65 % SOLN nasal spray Place 1 spray into both nostrils as needed for congestion.   Taking  . spironolactone (ALDACTONE) 100 MG tablet Take 1 tablet (100 mg total) by mouth daily. 90 tablet 1   . SYMBICORT 160-4.5 MCG/ACT inhaler INHALE 2 PUFFS INTO  LUNGS 2 TIMES DAILY AS DIRECTED 10.2 Inhaler 5 Taking  . topiramate (TOPAMAX) 50 MG tablet Take 1 tablet in the monring and 2 at bedtime. (Patient taking differently: 25 mg 2 (two) times daily. Take 1 tablet in the monring and 2 at bedtime.) 270 tablet 3 Taking  . traMADol (ULTRAM) 50 MG tablet Take 1 tablet (50 mg total) by mouth every 6 (six) hours as needed. 60 tablet 4   . traZODone (DESYREL) 150 MG tablet TAKE 1 TABLET BY MOUTH AT BEDTIME 90 tablet 1 Taking  . valsartan (DIOVAN) 160 MG tablet Take 2 tablets (320 mg total) by mouth daily. 90 tablet 2 Taking  . VENTOLIN HFA 108 (90 Base) MCG/ACT inhaler INHALE 2 PUFFS INTO THE LUNGS EVERY 4 HOURS AS NEEDED FOR WHEEZING OR SHORTNESS OF BREATH 18 Inhaler 1 Taking   Results for orders placed or performed during the hospital encounter of 11/05/16 (from the past 48 hour(s))  Urinalysis, Routine w reflex microscopic     Status: Abnormal   Collection Time: 11/05/16  2:53 PM  Result Value Ref Range   Color, Urine RED (A) YELLOW    Comment: BIOCHEMICALS MAY BE AFFECTED BY COLOR   APPearance CLOUDY (A) CLEAR   Specific Gravity, Urine  1.005 - 1.030    TEST NOT REPORTED DUE TO COLOR INTERFERENCE OF URINE PIGMENT   pH  5.0 - 8.0    TEST NOT REPORTED DUE TO COLOR INTERFERENCE OF URINE PIGMENT   Glucose, UA (A) NEGATIVE mg/dL  TEST NOT REPORTED DUE TO COLOR INTERFERENCE OF URINE PIGMENT   Hgb urine dipstick (A) NEGATIVE    TEST NOT REPORTED DUE TO COLOR INTERFERENCE OF URINE PIGMENT   Bilirubin Urine (A) NEGATIVE    TEST NOT REPORTED DUE TO COLOR INTERFERENCE OF URINE PIGMENT   Ketones, ur (A) NEGATIVE mg/dL    TEST NOT REPORTED DUE TO COLOR INTERFERENCE OF URINE PIGMENT   Protein, ur (A) NEGATIVE mg/dL    TEST NOT REPORTED DUE TO COLOR INTERFERENCE OF URINE PIGMENT   Nitrite (A) NEGATIVE    TEST NOT REPORTED DUE TO COLOR INTERFERENCE OF URINE PIGMENT   Leukocytes, UA (A) NEGATIVE    TEST NOT REPORTED DUE TO COLOR INTERFERENCE OF URINE PIGMENT   Urinalysis, Microscopic (reflex)     Status: Abnormal   Collection Time: 11/05/16  2:53 PM  Result Value Ref Range   RBC / HPF TOO NUMEROUS TO COUNT 0 - 5 RBC/hpf   WBC, UA 0-5 0 - 5 WBC/hpf   Bacteria, UA FEW (A) NONE SEEN   Squamous Epithelial / LPF 0-5 (A) NONE SEEN  Pregnancy, urine POC     Status: None   Collection Time: 11/05/16  3:15 PM  Result Value Ref Range   Preg Test, Ur NEGATIVE NEGATIVE    Comment:        THE SENSITIVITY OF THIS METHODOLOGY IS >24 mIU/mL   CBC     Status: None   Collection Time: 11/05/16  3:37 PM  Result Value Ref Range   WBC 7.1 4.0 - 10.5 K/uL   RBC 4.10 3.87 - 5.11 MIL/uL   Hemoglobin 12.4 12.0 - 15.0 g/dL   HCT 36.3 36.0 - 46.0 %   MCV 88.5 78.0 - 100.0 fL   MCH 30.2 26.0 - 34.0 pg   MCHC 34.2 30.0 - 36.0 g/dL   RDW 14.2 11.5 - 15.5 %   Platelets 210 150 - 400 K/uL   *Note: Due to a large number of results and/or encounters for the requested time period, some results have not been displayed. A complete set of results can be found in Results Review.    Review of Systems  Gastrointestinal: Negative for abdominal pain.  Genitourinary: Positive for vaginal bleeding.  Neurological: Positive for dizziness (occasionally ).   Physical Exam   Pulse (!) 103, temperature 98.8 F (37.1 C), temperature source Oral, resp. rate 17, weight 194 lb 1.9 oz (88.1 kg), SpO2 100 %.  Physical Exam  Constitutional: She is oriented to person, place, and time. She appears well-developed and well-nourished.  Non-toxic appearance. She does not have a sickly appearance. She does not appear ill. No distress.  HENT:  Head: Normocephalic.  Respiratory: Effort normal.  GI: Soft. She exhibits no distension. There is no tenderness. There is no rebound and no guarding.  Genitourinary:  Genitourinary Comments: Vagina - Small amount of dark red blood in the vagina.  Cervix -  no active bleeding  Bimanual exam: Cervix closed, no CMT  Uterus non tender, normal  size Adnexa non tender, no masses bilaterally Chaperone present for exam.   Musculoskeletal: Normal range of motion.  Neurological: She is alert and oriented to person, place, and time.  Skin: Skin is warm. She is not diaphoretic.  Psychiatric: She has a normal mood and affect. Her behavior is normal.   MAU Course  Procedures None  MDM  CBC with stable Hgb, bleeding stable on exam.  Reviewed [place] from 8/2: results show the endometrium measures  15 mm which is near the upper limits of normal for a premenopausal woman. However, it demonstrates abnormal morphology containing multiple cysts and mild increased blood flow. Recommend gynecologic consultation with endometrial biopsy for further assessment.  Discussed history, [place], and labs with Dr. Nehemiah Settle, ok for discharge.   Assessment and Plan   A:   1. Abnormal vaginal bleeding     P:  Discharge home in stable condition Rx: megace Message sent to the [facility] for follow up to schedule endometrial biopsy.  Bleeding precautions Return to MAU is symptoms worsen   Rasch, Artist Pais, NP 11/05/2016 5:18 PM

## 2016-11-05 NOTE — MAU Note (Signed)
+  vaginal bleeding 2 pads in last hour Started Saturday +clots; up to a size of a golf ball  Last LMP 1 year ago  +lower abdominal pain Was seen last week at PCP Discomfort States she was told she had "several cysts in endometrium" Rating pain 5/10

## 2016-11-05 NOTE — Telephone Encounter (Signed)
Referral was placed at the [facility] and they advised pt to go to the ER there so the current issue can be addressed.   Pt informed.

## 2016-11-05 NOTE — Discharge Instructions (Signed)

## 2016-11-07 ENCOUNTER — Ambulatory Visit: Payer: Medicare Other | Admitting: Oncology

## 2016-11-08 ENCOUNTER — Other Ambulatory Visit: Payer: Self-pay | Admitting: Adult Health

## 2016-11-08 DIAGNOSIS — Z9889 Other specified postprocedural states: Secondary | ICD-10-CM

## 2016-11-12 ENCOUNTER — Other Ambulatory Visit: Payer: Self-pay | Admitting: Oncology

## 2016-11-12 ENCOUNTER — Encounter: Payer: Self-pay | Admitting: Obstetrics and Gynecology

## 2016-11-12 DIAGNOSIS — Z9889 Other specified postprocedural states: Secondary | ICD-10-CM

## 2016-11-13 ENCOUNTER — Ambulatory Visit
Admission: RE | Admit: 2016-11-13 | Discharge: 2016-11-13 | Disposition: A | Discharge status: Home / Self Care | Payer: Medicare Other | Source: Ambulatory Visit | Attending: Adult Health | Admitting: Adult Health

## 2016-11-13 DIAGNOSIS — R928 Other abnormal and inconclusive findings on diagnostic imaging of breast: Secondary | ICD-10-CM | POA: Diagnosis not present

## 2016-11-13 DIAGNOSIS — Z9889 Other specified postprocedural states: Secondary | ICD-10-CM

## 2016-11-13 HISTORY — DX: Personal history of antineoplastic chemotherapy: Z92.21

## 2016-11-13 HISTORY — DX: Personal history of irradiation: Z92.3

## 2016-11-19 ENCOUNTER — Other Ambulatory Visit: Payer: Self-pay

## 2016-11-19 DIAGNOSIS — Z171 Estrogen receptor negative status [ER-]: Secondary | ICD-10-CM

## 2016-11-19 DIAGNOSIS — C50412 Malignant neoplasm of upper-outer quadrant of left female breast: Secondary | ICD-10-CM

## 2016-11-20 ENCOUNTER — Other Ambulatory Visit (HOSPITAL_BASED_OUTPATIENT_CLINIC_OR_DEPARTMENT_OTHER): Payer: Medicare Other

## 2016-11-20 ENCOUNTER — Ambulatory Visit (HOSPITAL_BASED_OUTPATIENT_CLINIC_OR_DEPARTMENT_OTHER): Payer: Medicare Other

## 2016-11-20 ENCOUNTER — Ambulatory Visit (HOSPITAL_BASED_OUTPATIENT_CLINIC_OR_DEPARTMENT_OTHER): Payer: Medicare Other | Admitting: Oncology

## 2016-11-20 VITALS — BP 182/100 | HR 89 | Temp 98.7°F | Resp 20 | Ht 66.0 in | Wt 196.8 lb

## 2016-11-20 DIAGNOSIS — I10 Essential (primary) hypertension: Secondary | ICD-10-CM

## 2016-11-20 DIAGNOSIS — C50412 Malignant neoplasm of upper-outer quadrant of left female breast: Secondary | ICD-10-CM | POA: Diagnosis not present

## 2016-11-20 DIAGNOSIS — Z5111 Encounter for antineoplastic chemotherapy: Secondary | ICD-10-CM

## 2016-11-20 DIAGNOSIS — Z171 Estrogen receptor negative status [ER-]: Secondary | ICD-10-CM

## 2016-11-20 LAB — CBC WITH DIFFERENTIAL/PLATELET
BASO%: 0.4 % (ref 0.0–2.0)
Basophils Absolute: 0 10*3/uL (ref 0.0–0.1)
EOS%: 1.1 % (ref 0.0–7.0)
Eosinophils Absolute: 0.1 10*3/uL (ref 0.0–0.5)
HCT: 40 % (ref 34.8–46.6)
HEMOGLOBIN: 13.2 g/dL (ref 11.6–15.9)
LYMPH#: 1.9 10*3/uL (ref 0.9–3.3)
LYMPH%: 24.2 % (ref 14.0–49.7)
MCH: 29.9 pg (ref 25.1–34.0)
MCHC: 33.1 g/dL (ref 31.5–36.0)
MCV: 90.5 fL (ref 79.5–101.0)
MONO#: 0.5 10*3/uL (ref 0.1–0.9)
MONO%: 7.1 % (ref 0.0–14.0)
NEUT%: 67.2 % (ref 38.4–76.8)
NEUTROS ABS: 5.2 10*3/uL (ref 1.5–6.5)
Platelets: 235 10*3/uL (ref 145–400)
RBC: 4.42 10*6/uL (ref 3.70–5.45)
RDW: 15.1 % — AB (ref 11.2–14.5)
WBC: 7.8 10*3/uL (ref 3.9–10.3)

## 2016-11-20 LAB — COMPREHENSIVE METABOLIC PANEL
ALBUMIN: 3.5 g/dL (ref 3.5–5.0)
ALT: 24 U/L (ref 0–55)
AST: 19 U/L (ref 5–34)
Alkaline Phosphatase: 64 U/L (ref 40–150)
Anion Gap: 7 mEq/L (ref 3–11)
BUN: 10.5 mg/dL (ref 7.0–26.0)
CO2: 26 meq/L (ref 22–29)
Calcium: 9.4 mg/dL (ref 8.4–10.4)
Chloride: 107 mEq/L (ref 98–109)
Creatinine: 1 mg/dL (ref 0.6–1.1)
EGFR: 81 mL/min/{1.73_m2} — AB (ref >90)
GLUCOSE: 96 mg/dL (ref 70–140)
POTASSIUM: 3.7 meq/L (ref 3.5–5.1)
SODIUM: 140 meq/L (ref 136–145)
TOTAL PROTEIN: 7.7 g/dL (ref 6.4–8.3)
Total Bilirubin: 0.52 mg/dL (ref 0.20–1.20)

## 2016-11-20 MED ORDER — GOSERELIN ACETATE 3.6 MG ~~LOC~~ IMPL
3.6000 mg | DRUG_IMPLANT | Freq: Once | SUBCUTANEOUS | Status: AC
  Administered 2016-11-20: 3.6 mg via SUBCUTANEOUS
  Filled 2016-11-20: qty 3.6

## 2016-11-20 NOTE — Progress Notes (Signed)
ID: Erica [name]   DOB: [birth year]  MR#: [id number]  [name]  LTR:VUYEB, Arvid Right, MD GYN: SU:  Erroll Luna, MD Other:  Christinia Gully, MD;  Zenovia Jarred, MD;  Ena Dawley, MD, Luberta Mutter MD, Everitt Amber MD  CHIEF COMPLAINT:  Triple negative Breast Cancer  CURRENT TREATMENT: Observation  BREAST CANCER HISTORY: From the original intake note:  Erica [name] noticed a lump in her left breast sometime in August or September 2009.  Initially this was thought to possibly be "dry breast milk" but as the mass got bigger, she brought it to her physician's attention and was set up for mammography.  This was performed May 20, 2008 at [facility] and it showed a palpable 8.5 cm oval circumscribed mass in the left breast upper outer quadrant.  There were prominent left axillary lymph nodes noted as well.  An ultrasound showed this to be hypoechoic, with lobulated margins and probable central necrosis measuring up to 7.3 cm on this modality.  Ultrasound of the left axilla demonstrated numerous prominent lymph nodes at the upper limit of normal, the largest measuring 1 cm with a normal appearing thin cortex and a normal cortex to halo ratio.    Because the patient had no insurance, she was referred to [facility] and biopsy was performed in [place] with the pathology there ([id number]) showing a poorly differentiated malignancy which was triple negative (ER 1%, PR 0%, HER-2/neu 0).  The axillary lymph nodes were not biopsied. With this information, the patient was presented at the Breast Multidisciplinary Conference and the feeling was that, after appropriate staging, the patient would be a good candidate for neoadjuvant treatment and possibly NSABP B40.    Her subsequent history is as detailed below  INTERVAL HISTORY: Erica [name] returns today for follow-up of her estrogen receptor negative breast cancer. While she continues to have a variety of medical problems, today she reports no symptoms  suggestive of metastatic disease.  She continues on monthly goserelin chiefly for prevention of menorrhagia and birth control. She receives this every 28 days with a dose due today. She missed several appointments after March but resumed in July. She has some hot flashes associated with this but no other side effects that she is aware of.  REVIEW OF SYSTEMS: She has headaches related to her shunt, but these are stable. She tells me there was some blood in her Pap smear and she had a transvaginal ultrasound 10/31/2016 showing an endometrial thickness of 1.5 cm. There were multiple cysts and biopsy is scheduled for September. Her eye problems are followed at [facility]. A detailed review of systems today was otherwise stable  PAST MEDICAL HISTORY: Past Medical History:  Diagnosis Date  . Asthma   . Breast cancer ([place]) 2010   a. locally advanced left breast carcinoma diagnosed in 2010 and treated with neoadjuvant chemotherapy with docetaxel and cyclophosphamide as well as paclitaxel. This was followed by radiation therapy which was completed in 2011.  Marland Kitchen Dyspnea on exertion    a. 01/2013 Lexi MV: EF 54%, no ischemia/infarct;  b. 05/2013 Echo: EF 60-65%, no rwma, Gr 2 DD;  b.   . Fatty liver   . Gastroparesis   . GERD (gastroesophageal reflux disease)   . Hypertension   . Impaired glucose tolerance 04/13/2011  . Lymphadenitis, chronic    restricted LEFT extremity  . 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 OSA (obstructive sleep apnea) 05/04/2014   AHI 31/hr now on CPAP at 12cm H2O  .  Personal history of chemotherapy 2010  . Personal history of radiation therapy 2010    PAST SURGICAL HISTORY: Past Surgical History:  Procedure Laterality Date  . BRAIN SURGERY     Shunt  . BREAST LUMPECTOMY Left 09/2008  . CARPAL TUNNEL RELEASE Left 2011  . LYMPH NODE DISSECTION Left 2010   breast; 2 wks after breast lumpectomy  . TENDON RELEASE Right 2012   "hand"  . TUBAL LIGATION  05/11/2011   Procedure:  POST PARTUM TUBAL LIGATION;  Surgeon: Emeterio Reeve, MD;  Location: [facility];  Service: Gynecology;  Laterality: Bilateral;  Induced for HTN    FAMILY HISTORY Family History  Problem Relation Age of Onset  . Other Mother 42       breast calcifications treated with surgery, breast cancer pill, and radiation  . Fibroids Sister 95       s/p TAH-BSO  . Diabetes Maternal Grandmother   . Heart disease Maternal Grandmother   . Hypertension Maternal Grandmother   . Breast cancer Other        maternal great aunt (MGM's sister)  . Breast cancer Other        paternal great aunt dx middle ages; s/p mastectomy  . Cancer Neg Hx   . Alcohol abuse Neg Hx   . Early death Neg Hx   . Hyperlipidemia Neg Hx   . Kidney disease Neg Hx   . Stroke Neg Hx   . Colon cancer Neg Hx   The patient's parents are alive. The patient has one brother and one sister.  There is no ovarian or breast cancer in the immediate family, but the patient's maternal grandmother, who was one of 67 sisters, had one sister with breast cancer diagnosed in her 52.    GYNECOLOGIC HISTORY: updated January 2015 The patient is GX P4, first pregnancy to term at age 57.  She stopped having periods at the time of chemotherapy, and had had no periods for  2 years, then became pregnant in 2012. This was a surprise to her, and she was not aware of the pregnancy until the seventh month. She has not resumed normal menses, but has had some "spotting" on and off for the last several months.  SOCIAL HISTORY: updated  January 2015  She has been a Dance movement psychotherapist in the past. She's currently disabled. she splits her time between [place] and [place], [place]. She's currently single. Erica [name] has four children,  currently 59 year old, 90 years old, 63 and 43 years old. Her oldest daughter is attending college in [place].   Her youngest son is almost 2. 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 Smoking status: Never Smoker  . Smokeless tobacco: Never Used  . Alcohol use 0.0 oz/week     Comment: occasionally/socially     Colonoscopy: Never  PAP: June 2013  Bone density:  Never  Lipid panel: Nov 2014, Dr. Meda Coffee   Allergies  Allergen Reactions  . Aspirin Shortness Of Breath and Palpitations    Pt can take ibuprofen without reaction  . Bactrim [Sulfamethoxazole-Trimethoprim] Anaphylaxis  . Doxycycline Anaphylaxis  . Kiwi Extract Itching and Swelling    Lip swelling  . Penicillins Shortness Of Breath and Palpitations    Has patient had a PCN reaction causing immediate rash, facial/tongue/throat swelling, SOB or lightheadedness with hypotension: Yes Has patient had a PCN reaction  causing severe rash involving mucus membranes or skin necrosis: Yes Has patient had a PCN reaction that required hospitalization Yes Has patient had a PCN reaction occurring within the last 10 years: No If all of the above answers are "NO", then may proceed with Cephalosporin use.  [place] Extract Hives    Current Outpatient Prescriptions  Medication Sig Dispense Refill  . acetaZOLAMIDE (DIAMOX) 250 MG tablet Take 4 tablets (1,000 mg total) by mouth 3 (three) times daily. 1200 tablet 3  . albuterol (PROVENTIL) (2.5 MG/3ML) 0.083% nebulizer solution USE 1 VIAL VIA NEBULIZER EVERY 4 HOURS AS NEEDED( NEED APPOINTMENT) 300 mL 2  . amitriptyline (ELAVIL) 100 MG tablet Take 100 mg by mouth at bedtime.    Marland Kitchen amLODipine (NORVASC) 10 MG tablet TAKE 1 TABLET BY MOUTH ONCE DAILY 90 tablet 0  . Biotin 10000 MCG TABS Take 1 tablet by mouth daily.    . chlorpheniramine (CHLOR-TRIMETON) 4 MG tablet Take 8 mg by mouth at bedtime.    . chlorthalidone (HYGROTON) 25 MG tablet Take 1 tablet (25 mg total) by mouth daily. 90 tablet 2  . cloNIDine (CATAPRES) 0.3 MG tablet TAKE 1 TABLET BY MOUTH TWICE A DAY 180 tablet 1  . DEXILANT 60 MG capsule TAKE 1 CAPSULE  BY MOUTH ONCE DAILY 30 capsule 3  . dextromethorphan-guaiFENesin (MUCINEX DM) 30-600 MG 12hr tablet Take 1 tablet by mouth 2 (two) times daily as needed for cough.    . dicyclomine (BENTYL) 20 MG tablet TAKE 1 TABLET BY MOUTH 3 TIMES DAILY AS NEEDED FOR SPASMS 90 tablet 1  . DULoxetine (CYMBALTA) 30 MG capsule Take 1 capsule (30 mg total) by mouth daily. 90 capsule 3  . DULoxetine (CYMBALTA) 60 MG capsule Take 1 capsule (60 mg total) by mouth daily. 90 capsule 3  . EPINEPHrine 0.3 mg/0.3 mL IJ SOAJ injection Inject 0.3 mLs (0.3 mg total) into the muscle once as needed (anaphylaxis reaction). 2 Device 5  . fluticasone (FLONASE) 50 MCG/ACT nasal spray Place 2 sprays into both nostrils daily. 16 g 0  . fluticasone (FLOVENT HFA) 110 MCG/ACT inhaler Inhale 2 puffs into the lungs 2 (two) times daily. 1 Inhaler 12  . furosemide (LASIX) 20 MG tablet Take 20 mg by mouth daily as needed.    . hydrALAZINE (APRESOLINE) 100 MG tablet TAKE 1 TABLET(100 MG) BY MOUTH THREE TIMES DAILY 90 tablet 0  . lidocaine-prilocaine (EMLA) cream Apply 1 application topically as needed. 30 g 0  . loratadine (CLARITIN) 10 MG tablet Take 1 tablet (10 mg total) by mouth daily. 30 tablet 0  . MATZIM LA 420 MG 24 hr tablet TAKE 1 TABLET BY MOUTH ONCE DAILY 90 tablet 1  . megestrol (MEGACE) 20 MG tablet Take 2 tablets (40 mg total) by mouth 2 (two) times daily. 56 tablet 0  . metoCLOPramide (REGLAN) 10 MG tablet TAKE 1 TABLET BY MOUTH FOUR TIMES DAILY AT BEDTIME WITH MEALS 120 tablet 5  . mometasone (NASONEX) 50 MCG/ACT nasal spray Place 2 sprays into the nose 2 (two) times daily. 17 g 5  . montelukast (SINGULAIR) 10 MG tablet Take 1 tablet (10 mg total) by mouth at bedtime. 30 tablet 5  . Nebivolol HCl (BYSTOLIC) 20 MG TABS 2 tabs by mouth per day (Patient taking differently: 1 tab by mouth per day) 180 tablet 3  . NITROSTAT 0.4 MG SL tablet PLACE 1 TABLET UNDER TONGUE EVERY 5 MINUTES AS NEEDED FOR CHEST PAIN, MAX OF 3 TABLETS THEN  CALL 911 IF PAIN PERSISTS 100 tablet 0  . ondansetron (ZOFRAN) 4 MG tablet Take 4 mg by mouth every 6 (six) hours as needed for nausea or vomiting.    . OXYGEN Use CPAP at bedtime    . potassium chloride SA (K-DUR,KLOR-CON) 20 MEQ tablet Take 2 tablets (40 mEq total) by mouth daily. (Patient taking differently: Take 20 mEq by mouth 2 (two) times daily. ) 180 tablet 2  . pregabalin (LYRICA) 200 MG capsule Take 1 capsule (200 mg total) by mouth 3 (three) times daily. 90 capsule 5  . primidone (MYSOLINE) 50 MG tablet Take 0.5 tablets (25 mg total) by mouth at bedtime. 45 tablet 3  . QVAR 80 MCG/ACT inhaler INHALE 2 PUFFS INTO LUNGS 2 TIMES A DAY AS DIRECTED 8.7 g 5  . RANEXA 500 MG 12 hr tablet TAKE 1 TABLET BY MOUTH TWICE DAILY 180 tablet 1  . Respiratory Therapy Supplies (FLUTTER) DEVI Use as directed 1 each 0  . sodium chloride (OCEAN) 0.65 % SOLN nasal spray Place 1 spray into both nostrils as needed for congestion.    Marland Kitchen spironolactone (ALDACTONE) 100 MG tablet Take 1 tablet (100 mg total) by mouth daily. 90 tablet 1  . SYMBICORT 160-4.5 MCG/ACT inhaler INHALE 2 PUFFS INTO LUNGS 2 TIMES DAILY AS DIRECTED 10.2 Inhaler 5  . topiramate (TOPAMAX) 50 MG tablet Take 1 tablet in the monring and 2 at bedtime. (Patient taking differently: 25 mg 2 (two) times daily. Take 1 tablet in the monring and 2 at bedtime.) 270 tablet 3  . traMADol (ULTRAM) 50 MG tablet Take 1 tablet (50 mg total) by mouth every 6 (six) hours as needed. 60 tablet 4  . traZODone (DESYREL) 150 MG tablet TAKE 1 TABLET BY MOUTH AT BEDTIME 90 tablet 1  . valsartan (DIOVAN) 160 MG tablet Take 2 tablets (320 mg total) by mouth daily. 90 tablet 2  . VENTOLIN HFA 108 (90 Base) MCG/ACT inhaler INHALE 2 PUFFS INTO THE LUNGS EVERY 4 HOURS AS NEEDED FOR WHEEZING OR SHORTNESS OF BREATH 18 Inhaler 1   No current facility-administered medications for this visit.     OBJECTIVE:  Young African-American woman Who appears stated age  41:    11/20/16 1511  BP: (!) 182/100  Pulse: 89  Resp: 20  Temp: 98.7 F (37.1 C)  SpO2: 100%     Body mass index is 31.76 kg/m.    ECOG FS: 2 Filed Weights   11/20/16 1511  Weight: 196 lb 12.8 oz (89.3 kg)   Sclerae unicteric, EOMs intact Oropharynx clear and moist No cervical or supraclavicular adenopathy Lungs no rales or rhonchi Heart regular rate and rhythm Abd soft, nontender, positive bowel sounds MSK no focal spinal tenderness, no upper extremity lymphedema Neuro: nonfocal, well oriented, appropriate affect Breasts: The right breast is benign. The left breast has undergone lumpectomy followed by radiation. There is no evidence of local recurrence. Both axillae are benign.  LAB RESULTS: Lab Results  Component Value Date   WBC 7.8 11/20/2016   NEUTROABS 5.2 11/20/2016   HGB 13.2 11/20/2016   HCT 40.0 11/20/2016   MCV 90.5 11/20/2016   PLT 235 11/20/2016      Chemistry      Component Value Date/Time   NA 140 10/23/2016 1505   K 3.4 (L) 10/23/2016 1505   CL 102 10/15/2016 1726   CL 103 09/10/2012 1618   CO2 28 10/23/2016 1505   BUN 11.9 10/23/2016 1505   CREATININE 1.1  10/23/2016 1505      Component Value Date/Time   CALCIUM 9.3 10/23/2016 1505   ALKPHOS 76 10/23/2016 1505   AST 24 10/23/2016 1505   ALT 26 10/23/2016 1505   BILITOT 0.44 10/23/2016 1505        STUDIES: Bilateral diagnostic Mammography at the [facility] 11/13/2016, showed the breast density to be category B. There was no evidence of malignancy.  ASSESSMENT: 41 y.o. [place] woman with history of locally advanced left breast carcinoma initially presenting in March 2010   (1) treated neoadjuvantly with 4 cycles of docetaxel/ cyclophosphamide.    (2) status post left lumpectomy and sentinel lymph node dissection in July 2010 for a ypT2 ypN1, stage IIB invasive ductal carcinoma, grade 3, triple negative, with MIB-1 of 88%.   (3) status post full axillary dissection and margin clearance in  August 2010.   (4) status post 11 doses of weekly paclitaxel completed in December 2010   (5) radiation therapy completed in March 2011.  Now followed with observation alone.   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 This is very favorable.  Her hypertension continues to be an issue. She tells me when she takes a blood pressure at home it is more normal. There continued to be compliance problems but she is already on multiple blood pressure medications and I did not see a simple way to adjust that.  Her endometrial abnormalities are being followed on appropriately. She will continue on goserelin through the next year and she will see me again a year from now, or earlier if we can be of help.   Chauncey Cruel, MD    11/20/2016

## 2016-11-20 NOTE — Patient Instructions (Signed)
Goserelin injection What is this medicine? GOSERELIN (GOE se rel in) is similar to a hormone found in the body. It lowers the amount of sex hormones that the body makes. Men will have lower testosterone levels and women will have lower estrogen levels while taking this medicine. In men, this medicine is used to treat prostate cancer; the injection is either given once per month or once every 12 weeks. A once per month injection (only) is used to treat women with endometriosis, dysfunctional uterine bleeding, or advanced breast cancer. This medicine may be used for other purposes; ask your health care provider or pharmacist if you have questions. What should I tell my health care provider before I take this medicine? They need to know if you have any of these conditions (some only apply to women): -diabetes -heart disease or previous heart attack -high blood pressure -high cholesterol -kidney disease -osteoporosis or low bone density -problems passing urine -spinal cord injury -stroke -tobacco smoker -an unusual or allergic reaction to goserelin, hormone therapy, other medicines, foods, dyes, or preservatives -pregnant or trying to get pregnant -breast-feeding How should I use this medicine? This medicine is for injection under the skin. It is given by a health care professional in a hospital or clinic setting. Men receive this injection once every 4 weeks or once every 12 weeks. Women will only receive the once every 4 weeks injection. Talk to your pediatrician regarding the use of this medicine in children. Special care may be needed. Overdosage: If you think you have taken too much of this medicine contact a poison control center or emergency room at once. NOTE: This medicine is only for you. Do not share this medicine with others. What if I miss a dose? It is important not to miss your dose. Call your doctor or health care professional if you are unable to keep an appointment. What may  interact with this medicine? -female hormones like estrogen -herbal or dietary supplements like black cohosh, chasteberry, or DHEA -female hormones like testosterone -prasterone This list may not describe all possible interactions. Give your health care provider a list of all the medicines, herbs, non-prescription drugs, or dietary supplements you use. Also tell them if you smoke, drink alcohol, or use illegal drugs. Some items may interact with your medicine. What should I watch for while using this medicine? Visit your doctor or health care professional for regular checks on your progress. Your symptoms may appear to get worse during the first weeks of this therapy. Tell your doctor or healthcare professional if your symptoms do not start to get better or if they get worse after this time. Your bones may get weaker if you take this medicine for a long time. If you smoke or frequently drink alcohol you may increase your risk of bone loss. A family history of osteoporosis, chronic use of drugs for seizures (convulsions), or corticosteroids can also increase your risk of bone loss. Talk to your doctor about how to keep your bones strong. This medicine should stop regular monthly menstration in women. Tell your doctor if you continue to menstrate. Women should not become pregnant while taking this medicine or for 12 weeks after stopping this medicine. Women should inform their doctor if they wish to become pregnant or think they might be pregnant. There is a potential for serious side effects to an unborn child. Talk to your health care professional or pharmacist for more information. Do not breast-feed an infant while taking this medicine. Men should   inform their doctors if they wish to father a child. This medicine may lower sperm counts. Talk to your health care professional or pharmacist for more information. What side effects may I notice from receiving this medicine? Side effects that you should  report to your doctor or health care professional as soon as possible: -allergic reactions like skin rash, itching or hives, swelling of the face, lips, or tongue -bone pain -breathing problems -changes in vision -chest pain -feeling faint or lightheaded, falls -fever, chills -pain, swelling, warmth in the leg -pain, tingling, numbness in the hands or feet -signs and symptoms of low blood pressure like dizziness; feeling faint or lightheaded, falls; unusually weak or tired -stomach pain -swelling of the ankles, feet, hands -trouble passing urine or change in the amount of urine -unusually high or low blood pressure -unusually weak or tired Side effects that usually do not require medical attention (report to your doctor or health care professional if they continue or are bothersome): -change in sex drive or performance -changes in breast size in both males and females -changes in emotions or moods -headache -hot flashes -irritation at site where injected -loss of appetite -skin problems like acne, dry skin -vaginal dryness This list may not describe all possible side effects. Call your doctor for medical advice about side effects. You may report side effects to FDA at 1-800-FDA-1088. Where should I keep my medicine? This drug is given in a hospital or clinic and will not be stored at home. NOTE: This sheet is a summary. It may not cover all possible information. If you have questions about this medicine, talk to your doctor, pharmacist, or health care provider.    2016, Elsevier/Gold Standard. (2013-05-25 11:10:35)  

## 2016-11-25 ENCOUNTER — Encounter: Payer: Self-pay | Admitting: Physical Medicine & Rehabilitation

## 2016-12-08 ENCOUNTER — Other Ambulatory Visit: Payer: Self-pay | Admitting: Cardiology

## 2016-12-08 ENCOUNTER — Other Ambulatory Visit: Payer: Self-pay | Admitting: Internal Medicine

## 2016-12-08 ENCOUNTER — Other Ambulatory Visit: Payer: Self-pay | Admitting: Oncology

## 2016-12-09 ENCOUNTER — Ambulatory Visit (HOSPITAL_BASED_OUTPATIENT_CLINIC_OR_DEPARTMENT_OTHER): Payer: Medicare Other | Admitting: Physical Medicine & Rehabilitation

## 2016-12-09 ENCOUNTER — Other Ambulatory Visit (HOSPITAL_COMMUNITY)
Admission: RE | Admit: 2016-12-09 | Discharge: 2016-12-09 | Disposition: A | Discharge status: Home / Self Care | Payer: Medicare Other | Source: Ambulatory Visit | Attending: Obstetrics and Gynecology | Admitting: Obstetrics and Gynecology

## 2016-12-09 ENCOUNTER — Other Ambulatory Visit: Payer: Self-pay | Admitting: Internal Medicine

## 2016-12-09 ENCOUNTER — Encounter: Payer: Self-pay | Admitting: Physical Medicine & Rehabilitation

## 2016-12-09 ENCOUNTER — Encounter: Payer: Medicare Other | Attending: Physical Medicine & Rehabilitation

## 2016-12-09 ENCOUNTER — Ambulatory Visit (INDEPENDENT_AMBULATORY_CARE_PROVIDER_SITE_OTHER): Payer: Medicare Other | Admitting: Obstetrics and Gynecology

## 2016-12-09 ENCOUNTER — Ambulatory Visit (INDEPENDENT_AMBULATORY_CARE_PROVIDER_SITE_OTHER): Payer: Medicare Other | Admitting: Clinical

## 2016-12-09 ENCOUNTER — Encounter: Payer: Self-pay | Admitting: Obstetrics and Gynecology

## 2016-12-09 VITALS — BP 202/129 | HR 100 | Resp 14

## 2016-12-09 VITALS — BP 210/94 | HR 93 | Ht 64.0 in | Wt 194.0 lb

## 2016-12-09 DIAGNOSIS — I10 Essential (primary) hypertension: Secondary | ICD-10-CM | POA: Diagnosis not present

## 2016-12-09 DIAGNOSIS — Z9221 Personal history of antineoplastic chemotherapy: Secondary | ICD-10-CM | POA: Insufficient documentation

## 2016-12-09 DIAGNOSIS — G620 Drug-induced polyneuropathy: Secondary | ICD-10-CM | POA: Diagnosis not present

## 2016-12-09 DIAGNOSIS — G629 Polyneuropathy, unspecified: Secondary | ICD-10-CM | POA: Diagnosis present

## 2016-12-09 DIAGNOSIS — N938 Other specified abnormal uterine and vaginal bleeding: Secondary | ICD-10-CM | POA: Diagnosis not present

## 2016-12-09 DIAGNOSIS — Z923 Personal history of irradiation: Secondary | ICD-10-CM | POA: Diagnosis not present

## 2016-12-09 DIAGNOSIS — Z853 Personal history of malignant neoplasm of breast: Secondary | ICD-10-CM | POA: Diagnosis not present

## 2016-12-09 DIAGNOSIS — T451X5A Adverse effect of antineoplastic and immunosuppressive drugs, initial encounter: Secondary | ICD-10-CM | POA: Diagnosis not present

## 2016-12-09 DIAGNOSIS — N83201 Unspecified ovarian cyst, right side: Secondary | ICD-10-CM | POA: Diagnosis not present

## 2016-12-09 DIAGNOSIS — K219 Gastro-esophageal reflux disease without esophagitis: Secondary | ICD-10-CM | POA: Diagnosis not present

## 2016-12-09 DIAGNOSIS — G4733 Obstructive sleep apnea (adult) (pediatric): Secondary | ICD-10-CM | POA: Diagnosis not present

## 2016-12-09 DIAGNOSIS — J45909 Unspecified asthma, uncomplicated: Secondary | ICD-10-CM | POA: Insufficient documentation

## 2016-12-09 DIAGNOSIS — F450 Somatization disorder: Secondary | ICD-10-CM

## 2016-12-09 DIAGNOSIS — F322 Major depressive disorder, single episode, severe without psychotic features: Secondary | ICD-10-CM

## 2016-12-09 DIAGNOSIS — F329 Major depressive disorder, single episode, unspecified: Secondary | ICD-10-CM

## 2016-12-09 DIAGNOSIS — N840 Polyp of corpus uteri: Secondary | ICD-10-CM | POA: Diagnosis not present

## 2016-12-09 DIAGNOSIS — F32A Depression, unspecified: Secondary | ICD-10-CM

## 2016-12-09 DIAGNOSIS — N83209 Unspecified ovarian cyst, unspecified side: Secondary | ICD-10-CM | POA: Insufficient documentation

## 2016-12-09 HISTORY — DX: Adverse effect of antineoplastic and immunosuppressive drugs, initial encounter: G62.0

## 2016-12-09 LAB — POCT PREGNANCY, URINE: Preg Test, Ur: NEGATIVE

## 2016-12-09 MED ORDER — EPINEPHRINE 0.3 MG/0.3ML IJ SOAJ: 0.3000 mg | Freq: Once | INTRAMUSCULAR | 5 refills | Status: DC | PRN

## 2016-12-09 MED ORDER — GABAPENTIN 400 MG PO CAPS: 400.0000 mg | ORAL_CAPSULE | Freq: Three times a day (TID) | ORAL | 1 refills | Status: DC

## 2016-12-09 NOTE — Progress Notes (Signed)
Patient ID: Erica [name], female   DOB: Aug 13, 1975, 41 y.o.   MRN: [id number] Pt presents for [facility] Pt with Stage 2 B Invasive Ductal Breast Ca, Triple negative. S/P lumpectomy, node dissection, chemo and radio therapy. Currently on hormonal suppression with Goserelin.  Pt has been on Goserelin for @ 2 years. Last injection @ 2 weeks.  Noted heavy and irregular bleeding when first started. Then just irregular bleeding until this past August. Heavy bleeding until just a few days ago.   GYN U/S revealed right hemorrhagic ovarian cyst, thicken endometrium with cystic features.   Multiple medical problems followed by PCP.   PE  Lungs clear  Heart RRR Abd soft + BS GU uterus < 10 weeks, mobile, non tender  EMBX  Informed consent obtained. Procedure reviewed Pt placed in DL position Speculum placed. Cervix grasped. Endometrial pipelle easily  Passed. Bx completed. Bx to pathology. Pt tolerated well  A/P DUD        Right ovarian cyst  Most likely secondary to hormonal manipulation. EMBX  Completed today. Await results. F/U GYN U/S. Pt to return in 4 weeks to discuss BX and U/S to develop treatment plan

## 2016-12-09 NOTE — Progress Notes (Signed)
Integrated Behavioral Health Initial Visit  MRN: [id number] Name: Erica [name]   Session Start time: 2:50 Session End time: 3:10 Total time: 20 minutes  Type of Service: [place] Interpretor:No. Interpretor Name and Language: n/a   Warm Hand Off Completed.       SUBJECTIVE: Erica [name] is a 41 y.o. female accompanied by patient. Patient was referred by Dr Rip Harbour for depression. Patient reports the following symptoms/concerns: Pt states her primary concern today is feeling depressed and overwhelmed the past two years; her children help her the most to cope.Pt used to enjoy reading, but is painful to read now; is taking Cymbalta.  Duration of problem: Two years; Severity of problem: severe  OBJECTIVE: Mood: Depressed and Affect: Depressed Risk of harm to self or others: No plan to harm self or others   LIFE CONTEXT: Family and Social: Lives with four children, 5-23yo School/Work: disability Self-Care: - Life Changes: Cancer, physical pain, being unable to read  GOALS ADDRESSED: Patient will reduce symptoms of: depression and increase knowledge and/or ability of: coping skills and also: Increase healthy adjustment to current life circumstances   INTERVENTIONS: Mindfulness or Relaxation Training and Psychoeducation and/or Health Education  Standardized Assessments completed: GAD-7 and PHQ 9  ASSESSMENT: Patient currently experiencing Major depressive disorder, single episode, severe. Patient may benefit from psychoeducation and brief therapeutic intervention regarding coping with symptoms of depression.  PLAN: 1. Follow up with behavioral health clinician on : As needed 2. Behavioral recommendations:  -Consider distraction strategies to cope with pain, as discussed in office visit -Attend pain clinic appointment today -Consider discussing [place] medication options with prescribing provider -Consider borrowing audio books from the  [employer] -Ask grown daughter to read educational materials regarding coping with symptoms of depression aloud 3. Referral(s): [place] (In Clinic) and [employer] Resources:  [employer] 4. "From scale of 1-10, how likely are you to follow plan?": 7  Garlan Fair, LCSWA  Depression screen [facility] 2/9 12/09/2016 11/03/2015  Decreased Interest 3 0  Down, Depressed, Hopeless 3 0  PHQ - 2 Score 6 0  Altered sleeping 3 -  Tired, decreased energy 3 -  Change in appetite 3 -  Feeling bad or failure about yourself  3 -  Trouble concentrating 3 -  Moving slowly or fidgety/restless 2 -  Suicidal thoughts 0 -  PHQ-9 Score 23 -  Some recent data might be hidden   GAD 7 : Generalized Anxiety Score 12/09/2016  Nervous, Anxious, on Edge 3  Control/stop worrying 3  Worry too much - different things 3  Trouble relaxing 3  Restless 3  Easily annoyed or irritable 3  Afraid - awful might happen 0  Total GAD 7 Score 18

## 2016-12-09 NOTE — Patient Instructions (Signed)
Week 1  Please reduce Lyrica to 200 mg twice a day and start gabapentin 400 mg once a day  Week 2. Please reduce Lyrica to 200 mg once a day and start gabapentin 400 mg twice a day  Week 3. Please discontinue Lyrica and start gabapentin 400 mg 3 times a day

## 2016-12-09 NOTE — Progress Notes (Signed)
Subjective:    Patient ID: Erica [name], female    DOB: [birth year], 41 y.o.   MRN: [id number]  HPI Chief complaint is neuropathy pain , left greater than right, hands worse than feet  41 year old left-handed female with history of left  breast carcinoma in 2009, patient had 4 cycles of docetaxel and cyclophosphamide as well as a left lumpectomy and sentinel lymph node dissection. She underwent full axillary dissection in August 2010, she had 11 doses of weekly paclitaxel completed December 2010, as well as radiation therapy completed March 2011  Patient also diagnosed with pseudotumor cerebri, headache started postsurgically  She has been on medications for her pseudotumor more including Diamox, topiramate and Lasix,  Patient had a VP shunt placed at [facility] in October 2017.  For the neuropathy pain. Patient takes Cymbalta 90 mg a day, Lyrica 200 mg 3 times a day, amitriptyline 100 mg daily at bedtime  Also takes Tylenol No. 4 one tablet every 6 hours, this is helpful for headaches, but not for neuropathy pain  Patient has also tried tramadol up to 180 tablets per month, this was prescribed for cough, but the patient did not note any improvement in her neuropathy pain.   Pain Inventory Average Pain 6 Pain Right Now 6 My pain is sharp, burning, tingling and aching  In the last 24 hours, has pain interfered with the following? General activity 10 Relation with others 7 Enjoyment of life 10 What TIME of day is your pain at its worst? all Sleep (in general) Poor  Pain is worse with: unsure Pain improves with: no selection Relief from Meds: no selection  Mobility walk without assistance walk with assistance use a walker how many minutes can you walk? ? do you drive?  no  Function not employed: date last employed . disabled: date disabled .  Neuro/Psych weakness numbness tremor tingling trouble  walking spasms dizziness depression anxiety  Prior Studies new visit  Physicians involved in your care new visit   Family History  Problem Relation Age of Onset  . Other Mother 50       breast calcifications treated with surgery, breast cancer pill, and radiation  . Fibroids Sister 71       s/p TAH-BSO  . Diabetes Maternal Grandmother   . Heart disease Maternal Grandmother   . Hypertension Maternal Grandmother   . Breast cancer Other        maternal great aunt (MGM's sister)  . Breast cancer Other        paternal great aunt dx middle ages; s/p mastectomy  . Cancer Neg Hx   . Alcohol abuse Neg Hx   . Early death Neg Hx   . Hyperlipidemia Neg Hx   . Kidney disease Neg Hx   . Stroke Neg Hx   . Colon cancer Neg Hx    Social History   Social History  . Marital status: Single    Spouse name: N/A  . Number of children: 3  . Years of education: N/A   Occupational History  .  Unemployed   Social History Main Topics  . Smoking status: Never Smoker  . Smokeless tobacco: Never Used  . Alcohol use 0.0 oz/week     Comment: occasionally/socially  . Drug use: No  . Sexual activity: Yes    Birth control/ protection: Surgical   Other Topics Concern  . None   Social History Narrative   She lives with four children.   She  is currently not working (disbaility pending).         Past Surgical History:  Procedure Laterality Date  . BRAIN SURGERY     Shunt  . BREAST LUMPECTOMY Left 09/2008  . CARPAL TUNNEL RELEASE Left 2011  . LYMPH NODE DISSECTION Left 2010   breast; 2 wks after breast lumpectomy  . TENDON RELEASE Right 2012   "hand"  . TUBAL LIGATION  05/11/2011   Procedure: POST PARTUM TUBAL LIGATION;  Surgeon: Emeterio Reeve, MD;  Location: [facility];  Service: Gynecology;  Laterality: Bilateral;  Induced for HTN   Past Medical History:  Diagnosis Date  . Asthma   . Breast cancer ([place]) 2010   a. locally advanced left breast carcinoma diagnosed in 2010 and treated  with neoadjuvant chemotherapy with docetaxel and cyclophosphamide as well as paclitaxel. This was followed by radiation therapy which was completed in 2011.  Marland Kitchen Dyspnea on exertion    a. 01/2013 Lexi MV: EF 54%, no ischemia/infarct;  b. 05/2013 Echo: EF 60-65%, no rwma, Gr 2 DD;  b.   . Fatty liver   . Gastroparesis   . GERD (gastroesophageal reflux disease)   . Hypertension   . Impaired glucose tolerance 04/13/2011  . Lymphadenitis, chronic    restricted LEFT extremity  . 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 OSA (obstructive sleep apnea) 05/04/2014   AHI 31/hr now on CPAP at 12cm H2O  . Personal history of chemotherapy 2010  . Personal history of radiation therapy 2010   BP (!) 202/129 (BP Location: Right Arm, Patient Position: Sitting, Cuff Size: Large)   Pulse 100   Resp 14   SpO2 98%   Opioid Risk Score:   Fall Risk Score:  `1  Depression screen PHQ 2/9  Depression screen [facility] 2/9 12/09/2016 11/03/2015  Decreased Interest 3 0  Down, Depressed, Hopeless 3 0  PHQ - 2 Score 6 0  Altered sleeping 3 -  Tired, decreased energy 3 -  Change in appetite 3 -  Feeling bad or failure about yourself  3 -  Trouble concentrating 3 -  Moving slowly or fidgety/restless 2 -  Suicidal thoughts 0 -  PHQ-9 Score 23 -  Some recent data might be hidden    Review of Systems  Constitutional: Positive for appetite change, diaphoresis and unexpected weight change.  HENT: Negative.   Eyes: Negative.   Respiratory: Positive for apnea, cough, shortness of breath and wheezing.   Cardiovascular: Negative.   Gastrointestinal: Positive for abdominal pain, nausea and vomiting.  Endocrine: Negative.   Genitourinary: Negative.   Musculoskeletal: Positive for gait problem.  Skin: Negative.   Allergic/Immunologic: Negative.   Neurological: Positive for tremors, weakness, numbness and headaches.       Tingling  Hematological: Negative.   Psychiatric/Behavioral: Positive for dysphoric mood. The  patient is nervous/anxious.        Objective:   Physical Exam  Constitutional: She is oriented to person, place, and time. She appears well-developed and well-nourished.  HENT:  Head: Normocephalic and atraumatic.  Eyes: Pupils are equal, round, and reactive to light. Conjunctivae and EOM are normal.  Neck: Normal range of motion.  Cardiovascular: Normal rate, regular rhythm and normal heart sounds.  Exam reveals no friction rub.   No murmur heard. Pulmonary/Chest: Effort normal and breath sounds normal. No respiratory distress. She has no wheezes.  Abdominal: Soft. Bowel sounds are normal. She exhibits no distension. There is no tenderness.  Neurological: She is alert and oriented to  person, place, and time. She displays tremor. Coordination and gait normal.  Left upper extremity tremor. Fine 8-10 Hz  Motor strength is 5/5 bilateral deltoid, biceps, triceps, grip, hip flexor, knee extensor, ankle dorsiflexor  Skin: Skin is warm and dry.  Psychiatric: She has a normal mood and affect.  Nursing note and vitals reviewed.    Decreased proprioception left thumb and left great toe is able to sense up, but not down, decreased pinprick below wrist band level bilateral upper extremities and below mid calf in bilateral lower extremities     Assessment & Plan:  1. 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  She has no motor weakness, symptoms are mainly small fiber, she does have some impairment of proprioception on the left side.  She has been on multiple medications that are typically used for neuropathic pain including tricyclic antidepressants, atypical anticonvulsants. In addition, she is on low to moderate dose of narcotic analgesic in the form of codeine.  She is taking the Tylenol with codeine primarily for her cough and does not note any beneficial effect for her neuropathy pain.  We discussed making alterations to her medication  regimen, specifically she has not trialed gabapentin. We discussed that some people respond better to gabapentin versus pregabalin and vice versa.  Week 1   reduce Lyrica to 200 mg twice a day and start gabapentin 400 mg once a day  Week 2.  reduce Lyrica to 200 mg once a day and start gabapentin 400 mg twice a day  Week 3.  discontinue Lyrica and start gabapentin 400 mg 3 times a day  Follow-up in 1 month if not much better. We will increase the gabapentin to 600 mg. We'll maxed out at 800 if needed.  If this regimen is not helpful, consider Nucynta , however, that would require coming off the Tylenol with codeine

## 2016-12-09 NOTE — Patient Instructions (Signed)
ENDOMETRIAL BIOPSY POST-PROCEDURE INSTRUCTIONS  1. You may take Ibuprofen, Aleve or Tylenol for pain if needed.  Cramping should resolve within in 24 hours.  2. You may have a small amount of spotting.  You should wear a mini pad for the next few days.  3. You may have intercourse after 24 hours.  4. You need to call if you have any pelvic pain, fever, heavy bleeding or foul smelling vaginal discharge.  5. Shower or bathe as normal  6. We will call you within one week with results or we will discuss   the results at your follow-up appointment if needed.   

## 2016-12-09 NOTE — Progress Notes (Signed)
Patient verbally consented to meet with [facility] Clinician about presenting concerns.

## 2016-12-10 ENCOUNTER — Other Ambulatory Visit: Payer: Self-pay | Admitting: Internal Medicine

## 2016-12-13 ENCOUNTER — Telehealth: Payer: Self-pay

## 2016-12-13 NOTE — Telephone Encounter (Signed)
Called patient left a message for her to call us back regarding test results.  Please let pt know that her EMBX was negative  Keep F/U U/S appt and F/U with me as well.

## 2016-12-16 ENCOUNTER — Telehealth: Payer: Self-pay

## 2016-12-16 NOTE — Telephone Encounter (Signed)
Pt stated that she is returning a call for lab work.

## 2016-12-17 ENCOUNTER — Other Ambulatory Visit: Payer: Self-pay | Admitting: *Deleted

## 2016-12-17 ENCOUNTER — Other Ambulatory Visit: Payer: Self-pay | Admitting: Adult Health

## 2016-12-17 DIAGNOSIS — Z171 Estrogen receptor negative status [ER-]: Secondary | ICD-10-CM

## 2016-12-17 DIAGNOSIS — C50412 Malignant neoplasm of upper-outer quadrant of left female breast: Secondary | ICD-10-CM

## 2016-12-17 NOTE — Telephone Encounter (Signed)
Per Dr. Roselie Awkward, pt [facility] was negative.Pt needs to F/U with U/S appt and Dr. Roselie Awkward.Called patient to inform her that her EMBX was negative.Advised patient to f/u with U/S and follow up with Dr. Roselie Awkward. Patient verbalized understanding and had no questions.

## 2016-12-18 ENCOUNTER — Ambulatory Visit (HOSPITAL_BASED_OUTPATIENT_CLINIC_OR_DEPARTMENT_OTHER): Payer: Medicare Other

## 2016-12-18 ENCOUNTER — Other Ambulatory Visit (HOSPITAL_BASED_OUTPATIENT_CLINIC_OR_DEPARTMENT_OTHER): Payer: Medicare Other

## 2016-12-18 ENCOUNTER — Ambulatory Visit: Payer: Medicare Other

## 2016-12-18 ENCOUNTER — Other Ambulatory Visit: Payer: Medicare Other

## 2016-12-18 VITALS — BP 197/106 | HR 102 | Temp 98.6°F | Resp 18

## 2016-12-18 DIAGNOSIS — Z5111 Encounter for antineoplastic chemotherapy: Secondary | ICD-10-CM | POA: Diagnosis present

## 2016-12-18 DIAGNOSIS — C50412 Malignant neoplasm of upper-outer quadrant of left female breast: Secondary | ICD-10-CM

## 2016-12-18 DIAGNOSIS — Z171 Estrogen receptor negative status [ER-]: Secondary | ICD-10-CM

## 2016-12-18 LAB — CBC WITH DIFFERENTIAL/PLATELET
BASO%: 0.2 % (ref 0.0–2.0)
Basophils Absolute: 0 10*3/uL (ref 0.0–0.1)
EOS%: 0.9 % (ref 0.0–7.0)
Eosinophils Absolute: 0.1 10*3/uL (ref 0.0–0.5)
HCT: 41.4 % (ref 34.8–46.6)
HGB: 14 g/dL (ref 11.6–15.9)
LYMPH#: 1.7 10*3/uL (ref 0.9–3.3)
LYMPH%: 26.2 % (ref 14.0–49.7)
MCH: 29.9 pg (ref 25.1–34.0)
MCHC: 33.8 g/dL (ref 31.5–36.0)
MCV: 88.5 fL (ref 79.5–101.0)
MONO#: 0.4 10*3/uL (ref 0.1–0.9)
MONO%: 6.2 % (ref 0.0–14.0)
NEUT%: 66.5 % (ref 38.4–76.8)
NEUTROS ABS: 4.3 10*3/uL (ref 1.5–6.5)
Platelets: 225 10*3/uL (ref 145–400)
RBC: 4.68 10*6/uL (ref 3.70–5.45)
RDW: 13.1 % (ref 11.2–14.5)
WBC: 6.5 10*3/uL (ref 3.9–10.3)

## 2016-12-18 LAB — COMPREHENSIVE METABOLIC PANEL
ALT: 24 U/L (ref 0–55)
AST: 18 U/L (ref 5–34)
Albumin: 3.8 g/dL (ref 3.5–5.0)
Alkaline Phosphatase: 72 U/L (ref 40–150)
Anion Gap: 11 mEq/L (ref 3–11)
BUN: 9.7 mg/dL (ref 7.0–26.0)
CHLORIDE: 104 meq/L (ref 98–109)
CO2: 28 meq/L (ref 22–29)
CREATININE: 1 mg/dL (ref 0.6–1.1)
Calcium: 9.7 mg/dL (ref 8.4–10.4)
EGFR: 79 mL/min/{1.73_m2} — ABNORMAL LOW (ref >90)
Glucose: 106 mg/dl (ref 70–140)
Potassium: 3.1 mEq/L — ABNORMAL LOW (ref 3.5–5.1)
Sodium: 142 mEq/L (ref 136–145)
Total Bilirubin: 0.43 mg/dL (ref 0.20–1.20)
Total Protein: 8.1 g/dL (ref 6.4–8.3)

## 2016-12-18 MED ORDER — GOSERELIN ACETATE 3.6 MG ~~LOC~~ IMPL
3.6000 mg | DRUG_IMPLANT | Freq: Once | SUBCUTANEOUS | Status: AC
  Administered 2016-12-18: 3.6 mg via SUBCUTANEOUS
  Filled 2016-12-18: qty 3.6

## 2016-12-18 NOTE — Patient Instructions (Signed)
Goserelin injection What is this medicine? GOSERELIN (GOE se rel in) is similar to a hormone found in the body. It lowers the amount of sex hormones that the body makes. Men will have lower testosterone levels and women will have lower estrogen levels while taking this medicine. In men, this medicine is used to treat prostate cancer; the injection is either given once per month or once every 12 weeks. A once per month injection (only) is used to treat women with endometriosis, dysfunctional uterine bleeding, or advanced breast cancer. This medicine may be used for other purposes; ask your health care provider or pharmacist if you have questions. COMMON BRAND NAME(S): Zoladex What should I tell my health care provider before I take this medicine? They need to know if you have any of these conditions (some only apply to women): -diabetes -heart disease or previous heart attack -high blood pressure -high cholesterol -kidney disease -osteoporosis or low bone density -problems passing urine -spinal cord injury -stroke -tobacco smoker -an unusual or allergic reaction to goserelin, hormone therapy, other medicines, foods, dyes, or preservatives -pregnant or trying to get pregnant -breast-feeding How should I use this medicine? This medicine is for injection under the skin. It is given by a health care professional in a hospital or clinic setting. Men receive this injection once every 4 weeks or once every 12 weeks. Women will only receive the once every 4 weeks injection. Talk to your pediatrician regarding the use of this medicine in children. Special care may be needed. Overdosage: If you think you have taken too much of this medicine contact a poison control center or emergency room at once. NOTE: This medicine is only for you. Do not share this medicine with others. What if I miss a dose? It is important not to miss your dose. Call your doctor or health care professional if you are unable to  keep an appointment. What may interact with this medicine? -female hormones like estrogen -herbal or dietary supplements like black cohosh, chasteberry, or DHEA -female hormones like testosterone -prasterone This list may not describe all possible interactions. Give your health care provider a list of all the medicines, herbs, non-prescription drugs, or dietary supplements you use. Also tell them if you smoke, drink alcohol, or use illegal drugs. Some items may interact with your medicine. What should I watch for while using this medicine? Visit your doctor or health care professional for regular checks on your progress. Your symptoms may appear to get worse during the first weeks of this therapy. Tell your doctor or healthcare professional if your symptoms do not start to get better or if they get worse after this time. Your bones may get weaker if you take this medicine for a long time. If you smoke or frequently drink alcohol you may increase your risk of bone loss. A family history of osteoporosis, chronic use of drugs for seizures (convulsions), or corticosteroids can also increase your risk of bone loss. Talk to your doctor about how to keep your bones strong. This medicine should stop regular monthly menstration in women. Tell your doctor if you continue to menstrate. Women should not become pregnant while taking this medicine or for 12 weeks after stopping this medicine. Women should inform their doctor if they wish to become pregnant or think they might be pregnant. There is a potential for serious side effects to an unborn child. Talk to your health care professional or pharmacist for more information. Do not breast-feed an infant while taking   this medicine. Men should inform their doctors if they wish to father a child. This medicine may lower sperm counts. Talk to your health care professional or pharmacist for more information. What side effects may I notice from receiving this  medicine? Side effects that you should report to your doctor or health care professional as soon as possible: -allergic reactions like skin rash, itching or hives, swelling of the face, lips, or tongue -bone pain -breathing problems -changes in vision -chest pain -feeling faint or lightheaded, falls -fever, chills -pain, swelling, warmth in the leg -pain, tingling, numbness in the hands or feet -signs and symptoms of low blood pressure like dizziness; feeling faint or lightheaded, falls; unusually weak or tired -stomach pain -swelling of the ankles, feet, hands -trouble passing urine or change in the amount of urine -unusually high or low blood pressure -unusually weak or tired Side effects that usually do not require medical attention (report to your doctor or health care professional if they continue or are bothersome): -change in sex drive or performance -changes in breast size in both males and females -changes in emotions or moods -headache -hot flashes -irritation at site where injected -loss of appetite -skin problems like acne, dry skin -vaginal dryness This list may not describe all possible side effects. Call your doctor for medical advice about side effects. You may report side effects to FDA at 1-800-FDA-1088. Where should I keep my medicine? This drug is given in a hospital or clinic and will not be stored at home. NOTE: This sheet is a summary. It may not cover all possible information. If you have questions about this medicine, talk to your doctor, pharmacist, or health care provider.  2018 Elsevier/Gold Standard (2013-05-25 11:10:35)  

## 2016-12-23 ENCOUNTER — Ambulatory Visit (HOSPITAL_COMMUNITY)
Admission: RE | Admit: 2016-12-23 | Discharge: 2016-12-23 | Disposition: A | Discharge status: Home / Self Care | Payer: Medicare Other | Source: Ambulatory Visit | Attending: Obstetrics and Gynecology | Admitting: Obstetrics and Gynecology

## 2016-12-23 DIAGNOSIS — N83201 Unspecified ovarian cyst, right side: Secondary | ICD-10-CM | POA: Diagnosis not present

## 2016-12-23 DIAGNOSIS — N858 Other specified noninflammatory disorders of uterus: Secondary | ICD-10-CM | POA: Insufficient documentation

## 2016-12-23 DIAGNOSIS — Z8742 Personal history of other diseases of the female genital tract: Secondary | ICD-10-CM | POA: Insufficient documentation

## 2016-12-27 ENCOUNTER — Telehealth: Payer: Self-pay

## 2016-12-27 NOTE — Telephone Encounter (Signed)
Patient inform of test results and need to keep follow up appointment.

## 2016-12-27 NOTE — Telephone Encounter (Signed)
-----   Message from Chancy Milroy, MD sent at 12/26/2016  8:17 AM EDT ----- Please let pt know that the right ovarian cyst has resolved Keep f/u appt to further discuss Tx options for her DUB Thanks Legrand Como

## 2017-01-05 ENCOUNTER — Other Ambulatory Visit: Payer: Self-pay | Admitting: Internal Medicine

## 2017-01-06 ENCOUNTER — Encounter: Payer: Self-pay | Admitting: Physical Medicine & Rehabilitation

## 2017-01-06 ENCOUNTER — Ambulatory Visit (HOSPITAL_BASED_OUTPATIENT_CLINIC_OR_DEPARTMENT_OTHER): Payer: Medicare Other | Admitting: Physical Medicine & Rehabilitation

## 2017-01-06 ENCOUNTER — Other Ambulatory Visit: Payer: Self-pay | Admitting: Internal Medicine

## 2017-01-06 ENCOUNTER — Encounter: Payer: Medicare Other | Attending: Physical Medicine & Rehabilitation

## 2017-01-06 VITALS — BP 168/106 | HR 103 | Resp 14

## 2017-01-06 DIAGNOSIS — I10 Essential (primary) hypertension: Secondary | ICD-10-CM | POA: Insufficient documentation

## 2017-01-06 DIAGNOSIS — Z9221 Personal history of antineoplastic chemotherapy: Secondary | ICD-10-CM | POA: Insufficient documentation

## 2017-01-06 DIAGNOSIS — G629 Polyneuropathy, unspecified: Secondary | ICD-10-CM | POA: Diagnosis not present

## 2017-01-06 DIAGNOSIS — K219 Gastro-esophageal reflux disease without esophagitis: Secondary | ICD-10-CM | POA: Insufficient documentation

## 2017-01-06 DIAGNOSIS — G620 Drug-induced polyneuropathy: Secondary | ICD-10-CM

## 2017-01-06 DIAGNOSIS — J45909 Unspecified asthma, uncomplicated: Secondary | ICD-10-CM | POA: Diagnosis not present

## 2017-01-06 DIAGNOSIS — T451X5A Adverse effect of antineoplastic and immunosuppressive drugs, initial encounter: Secondary | ICD-10-CM | POA: Diagnosis not present

## 2017-01-06 DIAGNOSIS — Z923 Personal history of irradiation: Secondary | ICD-10-CM | POA: Insufficient documentation

## 2017-01-06 DIAGNOSIS — Z853 Personal history of malignant neoplasm of breast: Secondary | ICD-10-CM | POA: Diagnosis not present

## 2017-01-06 DIAGNOSIS — G4733 Obstructive sleep apnea (adult) (pediatric): Secondary | ICD-10-CM | POA: Insufficient documentation

## 2017-01-06 MED ORDER — TAPENTADOL HCL 75 MG PO TABS: 75.0000 mg | ORAL_TABLET | Freq: Four times a day (QID) | ORAL | 0 refills | Status: DC | PRN

## 2017-01-06 NOTE — Patient Instructions (Signed)
Will try to get preauthorization for the [facility]. If this gets approved, please call for instructions on how to wean Lyrica and Tylenol with Codeine

## 2017-01-06 NOTE — Progress Notes (Signed)
Subjective:    Patient ID: Erica [name], female    DOB: [birth year], 41 y.o.   MRN: [id number]  HPI Patient feels a little nauseated now that she is on the gabapentin. She is off the Lyrica and on the gabapentin 400 mg 3 times a day. Gabapentin seems to work about as well for the neuropathy pain as the Lyrica does not tolerate it as well due to stomach problems.  Patient states that she no longer takes the tramadol since starting on the Tylenol with codeine.  Patient has no new symptoms since last month.  Patient has had weakness in the left arm since her axillary lymph node dissection. She is left-handed  Pain Inventory Average Pain 6 Pain Right Now 8 My pain is sharp, burning, stabbing, tingling and aching  In the last 24 hours, has pain interfered with the following? General activity 7 Relation with others 8 Enjoyment of life 8 What TIME of day is your pain at its worst? all Sleep (in general) Poor  Pain is worse with: unsure Pain improves with: nothing Relief from Meds: 0  Mobility walk without assistance walk with assistance use a walker do you drive?  no  Function Do you have any goals in this area?  no  Neuro/Psych weakness numbness tremor tingling spasms dizziness depression anxiety  Prior Studies Any changes since last visit?  no  Physicians involved in your care Any changes since last visit?  no   Family History  Problem Relation Age of Onset  . Other Mother 67       breast calcifications treated with surgery, breast cancer pill, and radiation  . Fibroids Sister 4       s/p TAH-BSO  . Diabetes Maternal Grandmother   . Heart disease Maternal Grandmother   . Hypertension Maternal Grandmother   . Breast cancer Other        maternal great aunt (MGM's sister)  . Breast cancer Other        paternal great aunt dx middle ages; s/p mastectomy  . Cancer Neg Hx   . Alcohol abuse Neg Hx   . Early death Neg Hx   . Hyperlipidemia Neg Hx   .  Kidney disease Neg Hx   . Stroke Neg Hx   . Colon cancer Neg Hx    Social History   Social History  . Marital status: Single    Spouse name: N/A  . Number of children: 3  . Years of education: N/A   Occupational History  .  Unemployed   Social History Main Topics  . Smoking status: Never Smoker  . Smokeless tobacco: Never Used  . Alcohol use 0.0 oz/week     Comment: occasionally/socially  . Drug use: No  . Sexual activity: Yes    Birth control/ protection: Surgical   Other Topics Concern  . None   Social History Narrative   She lives with four children.   She is currently not working (disbaility pending).         Past Surgical History:  Procedure Laterality Date  . BRAIN SURGERY     Shunt  . BREAST LUMPECTOMY Left 09/2008  . CARPAL TUNNEL RELEASE Left 2011  . LYMPH NODE DISSECTION Left 2010   breast; 2 wks after breast lumpectomy  . TENDON RELEASE Right 2012   "hand"  . TUBAL LIGATION  05/11/2011   Procedure: POST PARTUM TUBAL LIGATION;  Surgeon: Emeterio Reeve, MD;  Location: [facility];  Service: Gynecology;  Laterality: Bilateral;  Induced for HTN   Past Medical History:  Diagnosis Date  . Asthma   . Breast cancer ([place]) 2010   a. locally advanced left breast carcinoma diagnosed in 2010 and treated with neoadjuvant chemotherapy with docetaxel and cyclophosphamide as well as paclitaxel. This was followed by radiation therapy which was completed in 2011.  Marland Kitchen Dyspnea on exertion    a. 01/2013 Lexi MV: EF 54%, no ischemia/infarct;  b. 05/2013 Echo: EF 60-65%, no rwma, Gr 2 DD;  b.   . Fatty liver   . Gastroparesis   . GERD (gastroesophageal reflux disease)   . Hypertension   . Impaired glucose tolerance 04/13/2011  . Lymphadenitis, chronic    restricted LEFT extremity  . 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 OSA (obstructive sleep apnea) 05/04/2014   AHI 31/hr now on CPAP at 12cm H2O  . Personal history of chemotherapy 2010  . Personal history of radiation therapy  2010   BP (!) 187/110 (BP Location: Right Arm, Patient Position: Sitting, Cuff Size: Normal)   Pulse (!) 103   Resp 14   SpO2 97%   Opioid Risk Score:   Fall Risk Score:  `1  Depression screen PHQ 2/9  Depression screen [facility] 2/9 12/09/2016 11/03/2015  Decreased Interest 3 0  Down, Depressed, Hopeless 3 0  PHQ - 2 Score 6 0  Altered sleeping 3 -  Tired, decreased energy 3 -  Change in appetite 3 -  Feeling bad or failure about yourself  3 -  Trouble concentrating 3 -  Moving slowly or fidgety/restless 2 -  Suicidal thoughts 0 -  PHQ-9 Score 23 -  Some recent data might be hidden    Review of Systems  Constitutional: Positive for appetite change, diaphoresis and unexpected weight change.  HENT: Negative.   Eyes: Negative.   Respiratory: Positive for apnea, cough, shortness of breath and wheezing.   Cardiovascular: Positive for leg swelling.  Gastrointestinal: Positive for abdominal pain, nausea and vomiting.  Endocrine: Negative.   Genitourinary: Negative.   Musculoskeletal: Positive for back pain and neck pain.       Spasms  Allergic/Immunologic: Negative.   Neurological: Positive for dizziness, tremors, weakness and numbness.       Tingling  Psychiatric/Behavioral: Positive for dysphoric mood. The patient is nervous/anxious.   All other systems reviewed and are negative.      Objective:   Physical Exam  Constitutional: She is oriented to person, place, and time. She appears well-developed and well-nourished.  HENT:  Head: Normocephalic and atraumatic.  Eyes: Pupils are equal, round, and reactive to light. Conjunctivae and EOM are normal.  Neurological: She is alert and oriented to person, place, and time. A sensory deficit is present. She exhibits abnormal muscle tone. Coordination abnormal. Gait normal.  Sensation diminished to pinprick in bilateral upper and lower extremities, normalizes at mid forearm in the upper extremities and normalizes at the lower calf in the  lower extremities. Proprioception, joint sensation is intact bilaterally at the thumbs and great toes. Mildly diminished fine motor in the left upper extremity  Psychiatric: She has a normal mood and affect.  Nursing note and vitals reviewed.     Motor strength is 5/5 bilateral deltoid 5/5 in the right deltoid, biceps, triceps, grip 4/5 in the left bicep, tricep and grasp     Assessment & Plan:  1. 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 She has mainly small fiber neuropathy affecting bilateral upper and lower limbs. 2. 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 We'll switch back to Lyrica 200 mg 3 times a day Discussed trial of Nucynta 75 mg 4 times a day this would, if approved by insurance replace both Tylenol with codeine and Lyrica since it should help with both the cough and the neuropathic pain. Will likely require preapproval process and then we will need to see out-of-pocket costs. Over half of the 25 min visit was spent counseling and coordinating care.

## 2017-01-07 NOTE — Telephone Encounter (Signed)
Can you advise in PCP absence.  

## 2017-01-14 ENCOUNTER — Other Ambulatory Visit: Payer: Self-pay

## 2017-01-14 DIAGNOSIS — C50412 Malignant neoplasm of upper-outer quadrant of left female breast: Secondary | ICD-10-CM

## 2017-01-14 DIAGNOSIS — Z171 Estrogen receptor negative status [ER-]: Secondary | ICD-10-CM

## 2017-01-15 ENCOUNTER — Ambulatory Visit: Payer: Medicare Other

## 2017-01-15 ENCOUNTER — Ambulatory Visit (HOSPITAL_BASED_OUTPATIENT_CLINIC_OR_DEPARTMENT_OTHER): Payer: Medicare Other

## 2017-01-15 ENCOUNTER — Encounter: Payer: Self-pay | Admitting: Obstetrics and Gynecology

## 2017-01-15 ENCOUNTER — Other Ambulatory Visit (HOSPITAL_BASED_OUTPATIENT_CLINIC_OR_DEPARTMENT_OTHER): Payer: Medicare Other

## 2017-01-15 ENCOUNTER — Other Ambulatory Visit: Payer: Medicare Other

## 2017-01-15 DIAGNOSIS — Z171 Estrogen receptor negative status [ER-]: Secondary | ICD-10-CM

## 2017-01-15 DIAGNOSIS — Z5111 Encounter for antineoplastic chemotherapy: Secondary | ICD-10-CM

## 2017-01-15 DIAGNOSIS — C50412 Malignant neoplasm of upper-outer quadrant of left female breast: Secondary | ICD-10-CM

## 2017-01-15 LAB — CBC WITH DIFFERENTIAL/PLATELET
BASO%: 0.8 % (ref 0.0–2.0)
Basophils Absolute: 0.1 10*3/uL (ref 0.0–0.1)
EOS ABS: 0.1 10*3/uL (ref 0.0–0.5)
EOS%: 1.1 % (ref 0.0–7.0)
HEMATOCRIT: 42.4 % (ref 34.8–46.6)
HGB: 13.9 g/dL (ref 11.6–15.9)
LYMPH%: 24.3 % (ref 14.0–49.7)
MCH: 28.8 pg (ref 25.1–34.0)
MCHC: 32.8 g/dL (ref 31.5–36.0)
MCV: 87.8 fL (ref 79.5–101.0)
MONO#: 0.4 10*3/uL (ref 0.1–0.9)
MONO%: 5.6 % (ref 0.0–14.0)
NEUT%: 68.2 % (ref 38.4–76.8)
NEUTROS ABS: 4.5 10*3/uL (ref 1.5–6.5)
PLATELETS: 202 10*3/uL (ref 145–400)
RBC: 4.82 10*6/uL (ref 3.70–5.45)
RDW: 14.1 % (ref 11.2–14.5)
WBC: 6.6 10*3/uL (ref 3.9–10.3)
lymph#: 1.6 10*3/uL (ref 0.9–3.3)

## 2017-01-15 LAB — COMPREHENSIVE METABOLIC PANEL
ALK PHOS: 71 U/L (ref 40–150)
ALT: 26 U/L (ref 0–55)
ANION GAP: 9 meq/L (ref 3–11)
AST: 21 U/L (ref 5–34)
Albumin: 3.6 g/dL (ref 3.5–5.0)
BILIRUBIN TOTAL: 0.5 mg/dL (ref 0.20–1.20)
BUN: 10.1 mg/dL (ref 7.0–26.0)
CALCIUM: 9.3 mg/dL (ref 8.4–10.4)
CO2: 28 meq/L (ref 22–29)
Chloride: 106 mEq/L (ref 98–109)
Creatinine: 1.2 mg/dL — ABNORMAL HIGH (ref 0.6–1.1)
Glucose: 107 mg/dl (ref 70–140)
Potassium: 3.5 mEq/L (ref 3.5–5.1)
Sodium: 143 mEq/L (ref 136–145)
TOTAL PROTEIN: 7.5 g/dL (ref 6.4–8.3)

## 2017-01-15 MED ORDER — GOSERELIN ACETATE 3.6 MG ~~LOC~~ IMPL
3.6000 mg | DRUG_IMPLANT | Freq: Once | SUBCUTANEOUS | Status: AC
  Administered 2017-01-15: 3.6 mg via SUBCUTANEOUS
  Filled 2017-01-15: qty 3.6

## 2017-01-15 NOTE — Patient Instructions (Signed)
Goserelin injection What is this medicine? GOSERELIN (GOE se rel in) is similar to a hormone found in the body. It lowers the amount of sex hormones that the body makes. Men will have lower testosterone levels and women will have lower estrogen levels while taking this medicine. In men, this medicine is used to treat prostate cancer; the injection is either given once per month or once every 12 weeks. A once per month injection (only) is used to treat women with endometriosis, dysfunctional uterine bleeding, or advanced breast cancer. This medicine may be used for other purposes; ask your health care provider or pharmacist if you have questions. What should I tell my health care provider before I take this medicine? They need to know if you have any of these conditions (some only apply to women): -diabetes -heart disease or previous heart attack -high blood pressure -high cholesterol -kidney disease -osteoporosis or low bone density -problems passing urine -spinal cord injury -stroke -tobacco smoker -an unusual or allergic reaction to goserelin, hormone therapy, other medicines, foods, dyes, or preservatives -pregnant or trying to get pregnant -breast-feeding How should I use this medicine? This medicine is for injection under the skin. It is given by a health care professional in a hospital or clinic setting. Men receive this injection once every 4 weeks or once every 12 weeks. Women will only receive the once every 4 weeks injection. Talk to your pediatrician regarding the use of this medicine in children. Special care may be needed. Overdosage: If you think you have taken too much of this medicine contact a poison control center or emergency room at once. NOTE: This medicine is only for you. Do not share this medicine with others. What if I miss a dose? It is important not to miss your dose. Call your doctor or health care professional if you are unable to keep an appointment. What may  interact with this medicine? -female hormones like estrogen -herbal or dietary supplements like black cohosh, chasteberry, or DHEA -female hormones like testosterone -prasterone This list may not describe all possible interactions. Give your health care provider a list of all the medicines, herbs, non-prescription drugs, or dietary supplements you use. Also tell them if you smoke, drink alcohol, or use illegal drugs. Some items may interact with your medicine. What should I watch for while using this medicine? Visit your doctor or health care professional for regular checks on your progress. Your symptoms may appear to get worse during the first weeks of this therapy. Tell your doctor or healthcare professional if your symptoms do not start to get better or if they get worse after this time. Your bones may get weaker if you take this medicine for a long time. If you smoke or frequently drink alcohol you may increase your risk of bone loss. A family history of osteoporosis, chronic use of drugs for seizures (convulsions), or corticosteroids can also increase your risk of bone loss. Talk to your doctor about how to keep your bones strong. This medicine should stop regular monthly menstration in women. Tell your doctor if you continue to menstrate. Women should not become pregnant while taking this medicine or for 12 weeks after stopping this medicine. Women should inform their doctor if they wish to become pregnant or think they might be pregnant. There is a potential for serious side effects to an unborn child. Talk to your health care professional or pharmacist for more information. Do not breast-feed an infant while taking this medicine. Men should   inform their doctors if they wish to father a child. This medicine may lower sperm counts. Talk to your health care professional or pharmacist for more information. What side effects may I notice from receiving this medicine? Side effects that you should  report to your doctor or health care professional as soon as possible: -allergic reactions like skin rash, itching or hives, swelling of the face, lips, or tongue -bone pain -breathing problems -changes in vision -chest pain -feeling faint or lightheaded, falls -fever, chills -pain, swelling, warmth in the leg -pain, tingling, numbness in the hands or feet -signs and symptoms of low blood pressure like dizziness; feeling faint or lightheaded, falls; unusually weak or tired -stomach pain -swelling of the ankles, feet, hands -trouble passing urine or change in the amount of urine -unusually high or low blood pressure -unusually weak or tired Side effects that usually do not require medical attention (report to your doctor or health care professional if they continue or are bothersome): -change in sex drive or performance -changes in breast size in both males and females -changes in emotions or moods -headache -hot flashes -irritation at site where injected -loss of appetite -skin problems like acne, dry skin -vaginal dryness This list may not describe all possible side effects. Call your doctor for medical advice about side effects. You may report side effects to FDA at 1-800-FDA-1088. Where should I keep my medicine? This drug is given in a hospital or clinic and will not be stored at home. NOTE: This sheet is a summary. It may not cover all possible information. If you have questions about this medicine, talk to your doctor, pharmacist, or health care provider.    2016, Elsevier/Gold Standard. (2013-05-25 11:10:35)  

## 2017-01-16 ENCOUNTER — Telehealth: Payer: Self-pay

## 2017-01-16 NOTE — Telephone Encounter (Signed)
Called pt to find out which appointment she is inquiring about; got a recording " at the subscribers request this phone does not accept incoming calls. Could not leave a mess.

## 2017-01-28 ENCOUNTER — Ambulatory Visit: Payer: Medicare Other | Admitting: Internal Medicine

## 2017-02-03 ENCOUNTER — Telehealth: Payer: Self-pay | Admitting: *Deleted

## 2017-02-03 ENCOUNTER — Encounter: Payer: Medicare Other | Attending: Physical Medicine & Rehabilitation

## 2017-02-03 ENCOUNTER — Encounter: Payer: Self-pay | Admitting: Physical Medicine & Rehabilitation

## 2017-02-03 ENCOUNTER — Ambulatory Visit: Payer: Medicare Other | Admitting: Oncology

## 2017-02-03 ENCOUNTER — Ambulatory Visit (HOSPITAL_BASED_OUTPATIENT_CLINIC_OR_DEPARTMENT_OTHER): Payer: Medicare Other | Admitting: Physical Medicine & Rehabilitation

## 2017-02-03 VITALS — BP 184/134 | HR 101

## 2017-02-03 DIAGNOSIS — Z79899 Other long term (current) drug therapy: Secondary | ICD-10-CM

## 2017-02-03 DIAGNOSIS — Z923 Personal history of irradiation: Secondary | ICD-10-CM | POA: Diagnosis not present

## 2017-02-03 DIAGNOSIS — K219 Gastro-esophageal reflux disease without esophagitis: Secondary | ICD-10-CM | POA: Diagnosis not present

## 2017-02-03 DIAGNOSIS — I10 Essential (primary) hypertension: Secondary | ICD-10-CM | POA: Diagnosis not present

## 2017-02-03 DIAGNOSIS — Z9221 Personal history of antineoplastic chemotherapy: Secondary | ICD-10-CM | POA: Insufficient documentation

## 2017-02-03 DIAGNOSIS — G629 Polyneuropathy, unspecified: Secondary | ICD-10-CM | POA: Diagnosis not present

## 2017-02-03 DIAGNOSIS — T451X5A Adverse effect of antineoplastic and immunosuppressive drugs, initial encounter: Secondary | ICD-10-CM | POA: Diagnosis not present

## 2017-02-03 DIAGNOSIS — G620 Drug-induced polyneuropathy: Secondary | ICD-10-CM

## 2017-02-03 DIAGNOSIS — Z5181 Encounter for therapeutic drug level monitoring: Secondary | ICD-10-CM

## 2017-02-03 DIAGNOSIS — Z853 Personal history of malignant neoplasm of breast: Secondary | ICD-10-CM | POA: Diagnosis not present

## 2017-02-03 DIAGNOSIS — J45909 Unspecified asthma, uncomplicated: Secondary | ICD-10-CM | POA: Diagnosis not present

## 2017-02-03 DIAGNOSIS — G4733 Obstructive sleep apnea (adult) (pediatric): Secondary | ICD-10-CM | POA: Diagnosis not present

## 2017-02-03 MED ORDER — TAPENTADOL HCL 75 MG PO TABS: 75.0000 mg | ORAL_TABLET | Freq: Four times a day (QID) | ORAL | 0 refills | Status: DC | PRN

## 2017-02-03 NOTE — Progress Notes (Signed)
Subjective:    Patient ID: Erica [name], female    DOB: [birth year], 41 y.o.   MRN: [id number]  HPI  41 year old female with history of breast carcinoma on the left side and chemotherapy-induced polyneuropathy who returns today for follow-up visit.  She continues to receive Goserelin on a monthly basis. She has tried gabapentin for neuropathy pain but this upset her stomach.  Her neuropathy pain affects both legs and left arm greater than right arm.  She has a history of left axillary lymph node dissection for positive nodes and it is felt that she may have some brachial plexopathy related to this. She remains independent with all her self-care and mobility. She was started on Nucynta 75 mg every 6 hours however problem with insurance preauthorization.  She has multiple pharmacies listed. Patient remains on Tylenol with codeine for cough.  She states that this does not help her neuropathy pain.  Also this does help her with headache pain   Pain Inventory Average Pain 7 Pain Right Now 10 My pain is sharp, burning, stabbing, tingling and aching  In the last 24 hours, has pain interfered with the following? General activity 10 Relation with others 10 Enjoyment of life 10 What TIME of day is your pain at its worst? all Sleep (in general) Poor  Pain is worse with: . Pain improves with: . Relief from Meds: 0  Mobility use a walker ability to climb steps?  no do you drive?  no  Function disabled: date disabled .  Neuro/Psych weakness numbness tremor tingling trouble walking spasms dizziness depression anxiety  Prior Studies Any changes since last visit?  no  Physicians involved in your care Any changes since last visit?  no   Family History  Problem Relation Age of Onset  . Other Mother 55       breast calcifications treated with surgery, breast cancer pill, and radiation  . Fibroids Sister 80       s/p TAH-BSO  . Diabetes Maternal Grandmother   . Heart  disease Maternal Grandmother   . Hypertension Maternal Grandmother   . Breast cancer Other        maternal great aunt (MGM's sister)  . Breast cancer Other        paternal great aunt dx middle ages; s/p mastectomy  . Cancer Neg Hx   . Alcohol abuse Neg Hx   . Early death Neg Hx   . Hyperlipidemia Neg Hx   . Kidney disease Neg Hx   . Stroke Neg Hx   . Colon cancer Neg Hx    Social History   Socioeconomic History  . Marital status: Single    Spouse name: Not on file  . Number of children: 3  . Years of education: Not on file  . Highest education level: Not on file  Social Needs  . Financial resource strain: Not on file  . Food insecurity - worry: Not on file  . Food insecurity - inability: Not on file  . Transportation needs - medical: Not on file  . Transportation needs - non-medical: Not on file  Occupational History    Employer: UNEMPLOYED  Tobacco Use  . Smoking status: Never Smoker  . Smokeless tobacco: Never Used  Substance and Sexual Activity  . Alcohol use: Yes    Alcohol/week: 0.0 oz    Comment: occasionally/socially  . Drug use: No  . Sexual activity: Yes    Birth control/protection: Surgical  Other Topics Concern  . Not  on file  Social History Narrative   She lives with four children.   She is currently not working (disbaility pending).      Past Surgical History:  Procedure Laterality Date  . BRAIN SURGERY     Shunt  . BREAST LUMPECTOMY Left 09/2008  . CARPAL TUNNEL RELEASE Left 2011  . LYMPH NODE DISSECTION Left 2010   breast; 2 wks after breast lumpectomy  . TENDON RELEASE Right 2012   "hand"   Past Medical History:  Diagnosis Date  . Asthma   . Breast cancer ([place]) 2010   a. locally advanced left breast carcinoma diagnosed in 2010 and treated with neoadjuvant chemotherapy with docetaxel and cyclophosphamide as well as paclitaxel. This was followed by radiation therapy which was completed in 2011.  Marland Kitchen Dyspnea on exertion    a. 01/2013 Lexi MV:  EF 54%, no ischemia/infarct;  b. 05/2013 Echo: EF 60-65%, no rwma, Gr 2 DD;  b.   . Fatty liver   . Gastroparesis   . GERD (gastroesophageal reflux disease)   . Hypertension   . Impaired glucose tolerance 04/13/2011  . Lymphadenitis, chronic    restricted LEFT extremity  . 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[place]) 09/27/2010  . OSA (obstructive sleep apnea) 05/04/2014   AHI 31/hr now on CPAP at 12cm H2O  . Personal history of chemotherapy 2010  . Personal history of radiation therapy 2010   There were no vitals taken for this visit.  Opioid Risk Score:   Fall Risk Score:  `1  Depression screen PHQ 2/9  Depression screen [facility] 2/9 02/03/2017 12/09/2016 11/03/2015  Decreased Interest 0 3 0  Down, Depressed, Hopeless 0 3 0  PHQ - 2 Score 0 6 0  Altered sleeping - 3 -  Tired, decreased energy - 3 -  Change in appetite - 3 -  Feeling bad or failure about yourself  - 3 -  Trouble concentrating - 3 -  Moving slowly or fidgety/restless - 2 -  Suicidal thoughts - 0 -  PHQ-9 Score - 23 -  Some recent data might be hidden     Review of Systems     Objective:   Physical Exam  Constitutional: She appears well-developed and well-nourished.  HENT:  Head: Normocephalic and atraumatic.  Eyes: Conjunctivae and EOM are normal. Pupils are equal, round, and reactive to light.  Psychiatric: She has a normal mood and affect. Her behavior is normal. Judgment and thought content normal.  Nursing note and vitals reviewed. Patient has reduced sensation to pinprick in bilateral feet up to the ankle.  She has intact sensation above the ankle.  She also has intact sensation in the hands although she states that the pinprick feels a little different in her fingers compared to her wrist. She has no evidence of hand or foot intrinsic atrophy. Motor strength is 5/5 bilateral deltoid bicep tricep grip hip flexor knee extensor ankle dorsiflexor.       Assessment & Plan:  #1.  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 She has failed treatment with atypical anticonvulsants.  She gets no relief with a opioid use for another indication. We discussed that there are not many options in this regard.  She is already on Cymbalta 90 mg a day.  Will attempt to get insurance authorization for Nucynta 75 mg every 6 hours.   We will get drug screen today, signed controlled substance agreement, if  Nucynta is approved, patient can come off of the Tylenol with codeine and see how her cough does.   Nurse practitioner visit in 1 month  Patient with elevated blood pressures, have reviewed chart this seems to be a common occurrence during her physician visits.  I have sent a note to her primary physician

## 2017-02-03 NOTE — Telephone Encounter (Signed)
Prior auth submitted for Nucynta 75 mg #120 through [employer] due to non formulary medication.

## 2017-02-04 ENCOUNTER — Ambulatory Visit (INDEPENDENT_AMBULATORY_CARE_PROVIDER_SITE_OTHER): Payer: Medicare Other | Admitting: Internal Medicine

## 2017-02-04 ENCOUNTER — Encounter: Payer: Self-pay | Admitting: Internal Medicine

## 2017-02-04 VITALS — BP 186/102 | HR 104 | Temp 99.1°F | Resp 16 | Ht 64.0 in | Wt 194.0 lb

## 2017-02-04 DIAGNOSIS — Z23 Encounter for immunization: Secondary | ICD-10-CM

## 2017-02-04 DIAGNOSIS — I119 Hypertensive heart disease without heart failure: Secondary | ICD-10-CM | POA: Diagnosis not present

## 2017-02-04 DIAGNOSIS — I10 Essential (primary) hypertension: Secondary | ICD-10-CM

## 2017-02-04 MED ORDER — CHLORTHALIDONE 25 MG PO TABS: 25.0000 mg | ORAL_TABLET | Freq: Every day | ORAL | 0 refills | Status: DC

## 2017-02-04 MED ORDER — CARVEDILOL 6.25 MG PO TABS: 6.2500 mg | ORAL_TABLET | Freq: Two times a day (BID) | ORAL | 0 refills | Status: DC

## 2017-02-04 MED ORDER — HYDRALAZINE HCL 25 MG PO TABS: 25.0000 mg | ORAL_TABLET | Freq: Three times a day (TID) | ORAL | 0 refills | Status: DC

## 2017-02-04 NOTE — Progress Notes (Signed)
Subjective:  Patient ID: Erica [name], female    DOB: 1975/11/03  Age: 41 y.o. MRN: [id number]  CC: Hypertension   HPI NATALIA WITTMEYER presents for a BP check -her blood pressure has remained very high.  I have been baffled by this so I decided to do some research.  I went over her medication list with her and though Bystolic and diltiazem are listed she tells me that earlier this year a cardiologist told her to stop taking them.  She said she has been taking amlodipine.  Hydralazine was prescribed a year ago but she has not gotten that filled recently.  I spoke to her pharmacist and asked which antihypertensive she has received in the last month or 2 and they indicated that she is only been receiving amlodipine .  She tells me she has been taking Acetazolamide at the direction of her neurologist.  Lasix is listed but she has not been taking it.  Her only recent complaint is a few episodes of headache.  She denies chest pain, shortness of breath, edema, blurred vision, nausea, or vomiting.  Outpatient Medications Prior to Visit  Medication Sig Dispense Refill  . acetaminophen-codeine (TYLENOL #4) 300-60 MG tablet Take 1 tablet by mouth every 4 (four) hours as needed for pain.    Marland Kitchen acetaZOLAMIDE (DIAMOX) 250 MG tablet Take 4 tablets (1,000 mg total) by mouth 3 (three) times daily. 1200 tablet 3  . albuterol (PROVENTIL) (2.5 MG/3ML) 0.083% nebulizer solution USE 1 VIAL VIA NEBULIZER EVERY 4 HOURS AND AS NEEDED FOR WHEEZING OR SHORTNESS OF BREATH 75 mL 1  . amitriptyline (ELAVIL) 100 MG tablet Take 100 mg by mouth at bedtime.    Marland Kitchen amLODipine (NORVASC) 10 MG tablet TAKE 1 TABLET BY MOUTH EVERY DAY 90 tablet 0  . Biotin 10000 MCG TABS Take 1 tablet by mouth daily.    . chlorpheniramine (CHLOR-TRIMETON) 4 MG tablet Take 8 mg by mouth at bedtime.    . cloNIDine (CATAPRES) 0.3 MG tablet TAKE 1 TABLET BY MOUTH TWICE A DAY 180 tablet 1  . DEXILANT 60 MG capsule TAKE 1 CAPSULE BY MOUTH ONCE DAILY 30  capsule 3  . dextromethorphan-guaiFENesin (MUCINEX DM) 30-600 MG 12hr tablet Take 1 tablet by mouth 2 (two) times daily as needed for cough.    . dicyclomine (BENTYL) 20 MG tablet TAKE 1 TABLET BY MOUTH 3 TIMES DAILY AS NEEDED FOR SPASMS 90 tablet 1  . DULoxetine (CYMBALTA) 30 MG capsule Take 1 capsule (30 mg total) by mouth daily. 90 capsule 3  . DULoxetine (CYMBALTA) 60 MG capsule Take 1 capsule (60 mg total) by mouth daily. 90 capsule 3  . EPINEPHRINE 0.3 mg/0.3 mL IJ SOAJ injection INJECT 0.3 MLS INTO THE MUSCLE ONCE AS NEEDED FOR ANAPHYLAXIS 2 Device 2  . fluticasone (FLONASE) 50 MCG/ACT nasal spray Place 2 sprays into both nostrils daily. 16 g 0  . fluticasone (FLOVENT HFA) 110 MCG/ACT inhaler Inhale 2 puffs into the lungs 2 (two) times daily. 1 Inhaler 12  . lidocaine-prilocaine (EMLA) cream APPLY TOPICALLY AS NEEDED. 30 g 0  . metoCLOPramide (REGLAN) 10 MG tablet TAKE 1 TABLET BY MOUTH 4 TIMES A DAY WITH MEALS AND AT BEDTIME 120 tablet 0  . mometasone (NASONEX) 50 MCG/ACT nasal spray Place 2 sprays into the nose 2 (two) times daily. 17 g 5  . montelukast (SINGULAIR) 10 MG tablet Take 1 tablet (10 mg total) by mouth at bedtime. 30 tablet 5  . NITROSTAT 0.4  MG SL tablet PLACE 1 TABLET UNDER TONGUE EVERY 5 MINUTES AS NEEDED FOR CHEST PAIN, MAX OF 3 TABLETS THEN CALL 911 IF PAIN PERSISTS 100 tablet 0  . OXYGEN Use CPAP at bedtime    . potassium chloride SA (K-DUR,KLOR-CON) 20 MEQ tablet Take 2 tablets (40 mEq total) by mouth daily. (Patient taking differently: Take 20 mEq by mouth 2 (two) times daily. ) 180 tablet 2  . pregabalin (LYRICA) 200 MG capsule Take 1 capsule (200 mg total) by mouth 3 (three) times daily. 90 capsule 5  . primidone (MYSOLINE) 50 MG tablet Take 0.5 tablets (25 mg total) by mouth at bedtime. 45 tablet 3  . RANEXA 500 MG 12 hr tablet TAKE 1 TABLET BY MOUTH TWICE DAILY 180 tablet 1  . Respiratory Therapy Supplies (FLUTTER) DEVI Use as directed 1 each 0  . SYMBICORT 160-4.5  MCG/ACT inhaler INHALE 2 PUFFS INTO LUNGS 2 TIMES DAILY AS DIRECTED 10.2 Inhaler 5  . tapentadol HCl (NUCYNTA) 75 MG tablet Take 1 tablet (75 mg total) every 6 (six) hours as needed by mouth. 120 tablet 0  . topiramate (TOPAMAX) 50 MG tablet Take 1 tablet in the monring and 2 at bedtime. (Patient taking differently: 25 mg 2 (two) times daily. Take 1 tablet in the monring and 2 at bedtime.) 270 tablet 3  . traMADol (ULTRAM) 50 MG tablet Take 1 tablet (50 mg total) by mouth every 6 (six) hours as needed. 60 tablet 4  . traZODone (DESYREL) 150 MG tablet TAKE 1 TABLET BY MOUTH AT BEDTIME 90 tablet 1  . VENTOLIN HFA 108 (90 Base) MCG/ACT inhaler INHALE 2 PUFFS INTO THE LUNGS EVERY 4 HOURS AS NEEDED FOR WHEEZING OR SHORTNESS OF BREATH 18 Inhaler 1  . chlorthalidone (HYGROTON) 25 MG tablet Take 1 tablet (25 mg total) by mouth daily. 90 tablet 2  . furosemide (LASIX) 20 MG tablet Take 20 mg by mouth daily as needed.    . gabapentin (NEURONTIN) 400 MG capsule Take 1 capsule (400 mg total) by mouth 3 (three) times daily. Week 1. Take 400 mg once a day Week 2 take 400 mg twice a day Week 3. Take 400 mg 3 times a day 90 capsule 1  . hydrALAZINE (APRESOLINE) 100 MG tablet TAKE 1 TABLET(100 MG) BY MOUTH THREE TIMES DAILY 90 tablet 0  . MATZIM LA 420 MG 24 hr tablet TAKE 1 TABLET BY MOUTH ONCE DAILY 90 tablet 1  . Nebivolol HCl (BYSTOLIC) 20 MG TABS 2 tabs by mouth per day (Patient taking differently: 1 tab by mouth per day) 180 tablet 3  . spironolactone (ALDACTONE) 100 MG tablet Take 1 tablet (100 mg total) by mouth daily. 90 tablet 1  . valsartan (DIOVAN) 160 MG tablet Take 2 tablets (320 mg total) by mouth daily. 90 tablet 2  . spironolactone (ALDACTONE) 25 MG tablet TAKE 1 TABLET BY MOUTH ONCE DAILY 90 tablet 0   No facility-administered medications prior to visit.     ROS Review of Systems  Constitutional: Negative.  Negative for diaphoresis, fatigue and unexpected weight change.  HENT: Negative.     Eyes: Negative for visual disturbance.  Respiratory: Negative for cough, chest tightness, shortness of breath and wheezing.   Cardiovascular: Negative.  Negative for chest pain, palpitations and leg swelling.  Gastrointestinal: Negative for abdominal pain, constipation, diarrhea, nausea and vomiting.  Endocrine: Negative.   Genitourinary: Negative.  Negative for difficulty urinating, dysuria, frequency and urgency.  Musculoskeletal: Negative.   Skin: Negative.  Neurological: Positive for headaches. Negative for dizziness, syncope, weakness, light-headedness and numbness.  Hematological: Negative for adenopathy. Does not bruise/bleed easily.  Psychiatric/Behavioral: Negative.     Objective:  BP (!) 186/102 (BP Location: Right Arm, Patient Position: Sitting, Cuff Size: Large)   Pulse (!) 104   Temp 99.1 F (37.3 C) (Oral)   Resp 16   Ht 5\' 4"  (1.626 m)   Wt 194 lb (88 kg)   SpO2 99%   BMI 33.30 kg/m   BP Readings from Last 3 Encounters:  02/04/17 (!) 186/102  02/03/17 (!) 184/134  01/15/17 (!) 207/85    Wt Readings from Last 3 Encounters:  02/04/17 194 lb (88 kg)  12/09/16 194 lb (88 kg)  11/20/16 196 lb 12.8 oz (89.3 kg)    Physical Exam  Constitutional: She is oriented to person, place, and time. No distress.  HENT:  Mouth/Throat: Oropharynx is clear and moist. No oropharyngeal exudate.  Eyes: Conjunctivae are normal. Right eye exhibits no discharge. Left eye exhibits no discharge. No scleral icterus.  Neck: Normal range of motion. Neck supple. No JVD present. No thyromegaly present.  Cardiovascular: Normal rate, regular rhythm and intact distal pulses. Exam reveals no gallop and no friction rub.  No murmur heard. Pulmonary/Chest: Effort normal and breath sounds normal. She has no wheezes. She has no rales.  Abdominal: Soft. Bowel sounds are normal. She exhibits no distension and no mass. There is no tenderness. There is no rebound and no guarding.  Musculoskeletal:  Normal range of motion. She exhibits no edema, tenderness or deformity.  Lymphadenopathy:    She has no cervical adenopathy.  Neurological: She is alert and oriented to person, place, and time.  Skin: Skin is warm and dry. No rash noted. She is not diaphoretic. No erythema. No pallor.  Vitals reviewed.   Lab Results  Component Value Date   WBC 6.6 01/15/2017   HGB 13.9 01/15/2017   HCT 42.4 01/15/2017   PLT 202 01/15/2017   GLUCOSE 107 01/15/2017   CHOL 195 10/15/2016   TRIG 193.0 (H) 10/15/2016   HDL 42.30 10/15/2016   LDLCALC 114 (H) 10/15/2016   ALT 26 01/15/2017   AST 21 01/15/2017   NA 143 01/15/2017   K 3.5 01/15/2017   CL 102 10/15/2016   CREATININE 1.2 (H) 01/15/2017   BUN 10.1 01/15/2017   CO2 28 01/15/2017   TSH 1.06 06/15/2015   INR 1.1 (H) 06/03/2012   HGBA1C 5.6 08/22/2015    US Pelvis Transvanginal Non-ob (tv Only)  Result Date: 12/23/2016 CLINICAL DATA:  Followup RIGHT ovarian cyst ; 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 Has not had a menstrual period in years. Recent endometrial biopsy. EXAM: ULTRASOUND PELVIS TRANSVAGINAL TECHNIQUE: Transvaginal ultrasound examination of the pelvis was performed including evaluation of the uterus, ovaries, adnexal regions, and pelvic cul-de-sac. COMPARISON:  10/31/2016 FINDINGS: Uterus Measurements: 8.0 x 4.3 x 5.2 cm. Heterogeneous myometrial echogenicity without discrete mass Endometrium Thickness: 13 mm thick. Inhomogeneous appearance with several tiny cystic foci versus loculated endometrial fluid, overall appearance similar that seen on the previous exam. Right ovary Measurements: 2.6 x 1.1 x 1.6 cm. Normal morphology without mass Left ovary Measurements: 2.7 x 1.9 x 2.2 cm. Normal morphology without mass Other findings:  No adnexal masses.  Trace free pelvic fluid. IMPRESSION: Resolution of previously identified complex RIGHT ovarian cyst. Persistent inhomogeneity of the endometrial  complex 13 mm thick, previously 14 mm, with minimal scattered fluid or tiny  areas of cystic change.; significance of this finding in this patient who has not had a menstrual period in years is uncertain, recommend correlation with results of reported recent endometrial biopsy. Electronically Signed   By: Lavonia Dana M.D.   On: 12/23/2016 13:31    Assessment & Plan:   Akili was seen today for hypertension.  Diagnoses and all orders for this visit:  Need for influenza vaccination -     Flu Vaccine QUAD 6+ mos PF IM (Fluarix Quad PF)  Essential hypertension- as below -     chlorthalidone (HYGROTON) 25 MG tablet; Take 1 tablet (25 mg total) daily by mouth. -     carvedilol (COREG) 6.25 MG tablet; Take 1 tablet (6.25 mg total) 2 (two) times daily with a meal by mouth. -     hydrALAZINE (APRESOLINE) 25 MG tablet; Take 1 tablet (25 mg total) 3 (three) times daily by mouth.  Hypertensive heart disease without heart failure- I informed her today that her blood pressure remains dangerously elevated and she is at risk of complications such as stroke, heart failure, and renal failure.  I told her that I do not think she is being compliant with the antihypertensives that hves been prescribed based on her pharmacy records.  I also asked her to be more active about her health and making sure that when she sees other physicians and medications are changed that the changes are listed in her medical history.  I asked her that if she is not going to be compliant with the medication is been's prescribed that she inform me of those changes.  For now, we will try to get better blood pressure control by restarting an alpha beta-blocker and a thiazide diuretic.  We will also restart hydralazine.  I have asked her to continue amlodipine.  If she is taking clonidine I asked her to continue taking it.  She expressed understanding with these recommendations and challenges.  She agrees to return in 6 weeks for blood pressure  recheck and in the meantime she will let me know if she develops any symptoms, complications, or issues with compliance. -     chlorthalidone (HYGROTON) 25 MG tablet; Take 1 tablet (25 mg total) daily by mouth. -     carvedilol (COREG) 6.25 MG tablet; Take 1 tablet (6.25 mg total) 2 (two) times daily with a meal by mouth. -     hydrALAZINE (APRESOLINE) 25 MG tablet; Take 1 tablet (25 mg total) 3 (three) times daily by mouth.   I have discontinued Kizzi D. Lehigh's Nebivolol HCl, hydrALAZINE, valsartan, furosemide, MATZIM LA, spironolactone, spironolactone, and gabapentin. I have also changed her chlorthalidone. Additionally, I am having her start on carvedilol and hydrALAZINE. Lastly, I am having her maintain her FLUTTER, OXYGEN, mometasone, NITROSTAT, potassium chloride SA, montelukast, topiramate, Biotin, chlorpheniramine, dextromethorphan-guaiFENesin, amitriptyline, fluticasone, DEXILANT, dicyclomine, cloNIDine, SYMBICORT, VENTOLIN HFA, RANEXA, traMADol, DULoxetine, pregabalin, acetaZOLAMIDE, DULoxetine, primidone, fluticasone, lidocaine-prilocaine, acetaminophen-codeine, EPINEPHrine, albuterol, traZODone, amLODipine, metoCLOPramide, and tapentadol HCl.  Meds ordered this encounter  Medications  . chlorthalidone (HYGROTON) 25 MG tablet    Sig: Take 1 tablet (25 mg total) daily by mouth.    Dispense:  90 tablet    Refill:  0  . carvedilol (COREG) 6.25 MG tablet    Sig: Take 1 tablet (6.25 mg total) 2 (two) times daily with a meal by mouth.    Dispense:  180 tablet    Refill:  0  . hydrALAZINE (APRESOLINE) 25 MG tablet  Sig: Take 1 tablet (25 mg total) 3 (three) times daily by mouth.    Dispense:  270 tablet    Refill:  0     Follow-up: Return in about 6 weeks (around 03/18/2017).  Scarlette Calico, MD

## 2017-02-04 NOTE — Patient Instructions (Signed)

## 2017-02-04 NOTE — Telephone Encounter (Addendum)
Prior auth approved for Nucynta 75 mg #120 approved 11/05/2016-02/03/2018. Attempted to reach Erica [name] but there is no answer on her phone and no voicemail picked up.  The mobile number associated with her pharmacy information for her is not accepting calls at this time at the subscribers request.

## 2017-02-05 ENCOUNTER — Ambulatory Visit (INDEPENDENT_AMBULATORY_CARE_PROVIDER_SITE_OTHER): Payer: Medicare Other | Admitting: Internal Medicine

## 2017-02-05 ENCOUNTER — Encounter: Payer: Self-pay | Admitting: Internal Medicine

## 2017-02-05 VITALS — BP 120/86 | HR 101 | Ht 66.0 in | Wt 192.0 lb

## 2017-02-05 DIAGNOSIS — J45991 Cough variant asthma: Secondary | ICD-10-CM

## 2017-02-05 MED ORDER — MOMETASONE FUROATE 200 MCG/ACT IN AERO: 2.0000 | INHALATION_SPRAY | Freq: Two times a day (BID) | RESPIRATORY_TRACT | 0 refills | Status: DC

## 2017-02-05 MED ORDER — BUDESONIDE-FORMOTEROL FUMARATE 160-4.5 MCG/ACT IN AERO: INHALATION_SPRAY | RESPIRATORY_TRACT | Status: DC

## 2017-02-05 NOTE — Patient Instructions (Addendum)
Change flovent to Asmanex 200  Take 2 puffs first thing in am and then another 2 puffs about 12 hours later.   See calendar for specific medication instructions and bring it back for each and every office visit for every healthcare provider you see.  Without it,  you may not receive the best quality medical care that we feel you deserve.  You will note that the calendar groups together  your maintenance  medications that are timed at particular times of the day.  Think of this as your checklist for what your doctor has instructed you to do until your next evaluation to see what benefit  there is  to staying on a consistent group of medications intended to keep you well.  The other group at the bottom is entirely up to you to use as you see fit  for specific symptoms that may arise between visits that require you to treat them on an as needed basis.  Think of this as your action plan or "what if" list.   Separating the top medications from the bottom group is fundamental to providing you adequate care going forward.    See Tammy NP w/in 4 weeks with all your medications, even over the counter meds, separated in two separate bags, the ones you take no matter what vs the ones you stop once you feel better and take only as needed when you feel you need them.   Tammy  will generate for you a new user friendly medication calendar that will put [place] all on the same page re: your medication use.

## 2017-02-05 NOTE — Progress Notes (Signed)
Subjective:    Patient ID: Erica [name], female     DOB: [birth year]    MRN: [id number]    Brief patient profile:  [id number] with asthma all her life much worse since around 07/2010 referred by Dr [name] to pulmonary clinic in Sept 2012 Hx of Breast Cancer 05/2008    History of Present Illness  12/18/2010 Initial pulmonary office eval on acei and  advair cc dtc asthma x 4 months but baseline = excess saba use "even when better" at least twice daily despite daily advair, worse at hs in fact  used saba 3 x last night.  On chemo /RT for breast ca but last rx was Jan 2012 well before asthma worse.  To er multiple times >  no better with singular added x 3 months. rec Stop Lisinopril Dulera 200 Take 2 puffs first thing in am and then another 2 puffs about 12 hours later.  Work on inhaler technique  Nexium 40 mg   Take 30-60 min before first meal of the day and Pepcid 20 mg one bedtime until return Only use your albuterol as a rescue medication to be used if you can't catch your breath by resting or doing a relaxed purse lip breathing pattern. The less you use it, the better it will work when you need it.  Prednisone 10 mg take  4 each am x 2 days,   2 each am x 2 days,  1 each am x2days and stop            03/22/2016 acute extended ov/Erica [name] re: recurrent cough / no med calendar Chief Complaint  Patient presents with  . Acute Visit    Increased coughing, tramadol is not working.   worse x one month to point of gag/vomit no better with tramadol/ head hurts from coughing fits 24/7 s sob rec Prednisone 10 mg take  4 each am x 2 days,   2 each am x 2 days,  1 each am x 2 days and stop  For cough take mucinex dm 1200 mg every 12 hours or delsym 2 tsp every 12 hours as needed and cough into the flutter valve  If still can't stop the cough, add tylenol #4 up to 1-2 every 4 hours -whatever it takes to completely stop the coughing x 3 straight days     02/05/2017  Extended f/u ov/Erica [name] re:  chronic cough x > 5y with element of documented asthma by MCT  Chief Complaint  Patient presents with  . Follow-up    Cough is unchanged since the last visit- still coughing with brown sputum.  She is using her albuterol inhaler once daily on average and neb with albuterol 4 x per wk on average.   brought in med calendar dated 04/19/16 but never updated and multiple disrepancies with MAR and now on coreg instead of bystolic due to insurance restriction. Overall symptoms are about the same though doing better noct with cough p taking 2 x chlortrimeton and pepcid at hs and continues to c/o overt HB despite ppi bid and reglan qid ac   Says has flutter but doesn't keep it with her during coughing fits as req   No obvious day to day or daytime variability or assoc  mucus plugs or hemoptysis or cp or chest tightness, subjective wheeze or overt sinus or hb symptoms. No unusual exp hx or h/o childhood pna/ asthma or knowledge of premature birth.  Sleeping ok flat without nocturnal  or  early am exacerbation  of respiratory  c/o's or need for noct saba. Also denies any obvious fluctuation of symptoms with weather or environmental changes or other aggravating or alleviating factors except as outlined above   Current Allergies, Complete Past Medical History, Past Surgical History, Family History, and Social History were reviewed in [employer] record.  ROS  The following are not active complaints unless bolded Hoarseness, sore throat, dysphagia, dental problems, itching, sneezing,  nasal congestion or discharge of excess mucus or purulent secretions, ear ache,   fever, chills, sweats, unintended wt loss or wt gain, classically pleuritic or exertional cp,  orthopnea pnd or leg swelling, presyncope, palpitations, abdominal pain, anorexia, nausea, vomiting, diarrhea  or change in bowel habits or change in bladder habits, change in stools or change in urine, dysuria, hematuria,  rash,  arthralgias, visual complaints, headache, numbness, weakness or ataxia or problems with walking or coordination,  change in mood/affect or memory.        Current Meds  Medication Sig  . acetaZOLAMIDE (DIAMOX) 250 MG tablet Take 4 tablets (1,000 mg total) by mouth 3 (three) times daily.  Marland Kitchen albuterol (PROVENTIL) (2.5 MG/3ML) 0.083% nebulizer solution USE 1 VIAL VIA NEBULIZER EVERY 4 HOURS AND AS NEEDED FOR WHEEZING OR SHORTNESS OF BREATH  . amitriptyline (ELAVIL) 100 MG tablet Take 100 mg by mouth at bedtime.  Marland Kitchen amLODipine (NORVASC) 10 MG tablet TAKE 1 TABLET BY MOUTH EVERY DAY  . Biotin 10000 MCG TABS Take 1 tablet by mouth daily.  . budesonide-formoterol (SYMBICORT) 160-4.5 MCG/ACT inhaler INHALE 2 PUFFS INTO LUNGS 2 TIMES DAILY AS DIRECTED  . carvedilol (COREG) 6.25 MG tablet Take 1 tablet (6.25 mg total) 2 (two) times daily with a meal by mouth.  . chlorpheniramine (CHLOR-TRIMETON) 4 MG tablet Take 8 mg by mouth at bedtime.  . chlorthalidone (HYGROTON) 25 MG tablet Take 1 tablet (25 mg total) daily by mouth.  . cloNIDine (CATAPRES) 0.3 MG tablet TAKE 1 TABLET BY MOUTH TWICE A DAY  . DEXILANT 60 MG capsule TAKE 1 CAPSULE BY MOUTH ONCE DAILY  . dextromethorphan-guaiFENesin (MUCINEX DM) 30-600 MG 12hr tablet Take 1 tablet by mouth 2 (two) times daily as needed for cough.  . dicyclomine (BENTYL) 20 MG tablet TAKE 1 TABLET BY MOUTH 3 TIMES DAILY AS NEEDED FOR SPASMS  . DULoxetine (CYMBALTA) 30 MG capsule Take 1 capsule (30 mg total) by mouth daily.  . DULoxetine (CYMBALTA) 60 MG capsule Take 1 capsule (60 mg total) by mouth daily.  Marland Kitchen EPINEPHRINE 0.3 mg/0.3 mL IJ SOAJ injection INJECT 0.3 MLS INTO THE MUSCLE ONCE AS NEEDED FOR ANAPHYLAXIS  . fluticasone (FLONASE) 50 MCG/ACT nasal spray Place 2 sprays into both nostrils daily.  . fluticasone (FLOVENT HFA) 110 MCG/ACT inhaler Inhale 2 puffs into the lungs 2 (two) times daily.  . hydrALAZINE (APRESOLINE) 25 MG tablet Take 1 tablet (25 mg total) 3  (three) times daily by mouth.  . lidocaine-prilocaine (EMLA) cream APPLY TOPICALLY AS NEEDED.  Marland Kitchen metoCLOPramide (REGLAN) 10 MG tablet TAKE 1 TABLET BY MOUTH 4 TIMES A DAY WITH MEALS AND AT BEDTIME  . mometasone (NASONEX) 50 MCG/ACT nasal spray Place 2 sprays into the nose 2 (two) times daily.  . montelukast (SINGULAIR) 10 MG tablet Take 1 tablet (10 mg total) by mouth at bedtime.  Marland Kitchen NITROSTAT 0.4 MG SL tablet PLACE 1 TABLET UNDER TONGUE EVERY 5 MINUTES AS NEEDED FOR CHEST PAIN, MAX OF 3 TABLETS THEN CALL 911 IF PAIN PERSISTS  .  OXYGEN Use CPAP at bedtime  . potassium chloride SA (K-DUR,KLOR-CON) 20 MEQ tablet Take 2 tablets (40 mEq total) by mouth daily. (Patient taking differently: Take 20 mEq by mouth 2 (two) times daily. )  . pregabalin (LYRICA) 200 MG capsule Take 1 capsule (200 mg total) by mouth 3 (three) times daily.  . primidone (MYSOLINE) 50 MG tablet Take 0.5 tablets (25 mg total) by mouth at bedtime.  Marland Kitchen RANEXA 500 MG 12 hr tablet TAKE 1 TABLET BY MOUTH TWICE DAILY  . Respiratory Therapy Supplies (FLUTTER) DEVI Use as directed  . tapentadol HCl (NUCYNTA) 75 MG tablet Take 1 tablet (75 mg total) every 6 (six) hours as needed by mouth.  . topiramate (TOPAMAX) 50 MG tablet Take 1 tablet in the monring and 2 at bedtime. (Patient taking differently: 25 mg 2 (two) times daily. Take 1 tablet in the monring and 2 at bedtime.)  . traMADol (ULTRAM) 50 MG tablet Take 1 tablet (50 mg total) by mouth every 6 (six) hours as needed.  . traZODone (DESYREL) 150 MG tablet TAKE 1 TABLET BY MOUTH AT BEDTIME  . VENTOLIN HFA 108 (90 Base) MCG/ACT inhaler INHALE 2 PUFFS INTO THE LUNGS EVERY 4 HOURS AS NEEDED FOR WHEEZING OR SHORTNESS OF BREATH  . [DISCONTINUED] SYMBICORT 160-4.5 MCG/ACT inhaler INHALE 2 PUFFS INTO LUNGS 2 TIMES DAILY AS DIRECTED                  Objective:   Physical Exam  Obese amb bf nad/ ? Belle indifference    vital signs reviewed  - Note on arrival 02 sats  98% on RA      Wt 213  12/18/10 > 01/03/2011  214 > 01/31/2011  206 >  09/11/2012 209 >208 6/19 > 210 09/25/2012 > 11/20/2012 211 >208 12/07/12> 01/12/2013 209 > 02/09/2013 210 >207 04/06/2013 > 07/14/2013  213>212 07/28/2013 >204 01/26/2014> 04/04/2014 206  >205 04/18/2014 >  07/18/2014 201 >  08/01/14 201 >198 08/30/2014 > 10/05/14  195 > 10/19/2014 192 > 191 11/08/2014 > 02/10/2015  198 > 03/24/2015   198 > 06/29/2015   201 > 08/31/2015  204 > 11/17/2015  197 > 12/15/2015  188 > 03/22/2016   188 > 02/05/2017     192     HEENT: nl dentition,  and oropharynx. Nl external ear canals without cough reflex- moderate bilateral non-specific turbinate edema     NECK :  without JVD/Nodes/TM/ nl carotid upstrokes bilaterally   LUNGS: no acc muscle use,  Nl contour chest which is clear to A and P bilaterally x for minimal transmitted wheeze from upper airway and no cough on insp or exp   CV:  RRR  no s3 or murmur or increase in P2, and no edema   ABD:  soft and nontender with nl inspiratory excursion in the supine position. No bruits or organomegaly appreciated, bowel sounds nl  MS:  Nl gait/ ext warm without deformities, calf tenderness, cyanosis or clubbing No obvious joint restrictions   SKIN: warm and dry without lesions    NEURO:  alert, approp, nl sensorium with  no motor or cerebellar deficits apparent.

## 2017-02-06 ENCOUNTER — Encounter: Payer: Self-pay | Admitting: Internal Medicine

## 2017-02-06 LAB — DRUG TOX MONITOR 1 W/CONF, ORAL FLD
Amphetamines: NEGATIVE ng/mL (ref <10)
BENZODIAZEPINES: NEGATIVE ng/mL (ref <0.50)
BUPRENORPHINE: NEGATIVE ng/mL (ref <0.10)
Barbiturates: NEGATIVE ng/mL (ref <10)
CODEINE: 2.5 ng/mL — AB (ref <2.5)
Cocaine: NEGATIVE ng/mL (ref <5.0)
Cotinine: 31.8 ng/mL — ABNORMAL HIGH (ref <5.0)
Dihydrocodeine: NEGATIVE ng/mL (ref <2.5)
Fentanyl: NEGATIVE ng/mL (ref <0.10)
HEROIN METABOLITE: NEGATIVE ng/mL (ref <1.0)
HYDROMORPHONE: NEGATIVE ng/mL (ref <2.5)
Hydrocodone: NEGATIVE ng/mL (ref <2.5)
MARIJUANA: NEGATIVE ng/mL (ref <2.5)
MDMA: NEGATIVE ng/mL (ref <10)
MEPROBAMATE: NEGATIVE ng/mL (ref <2.5)
METHADONE: NEGATIVE ng/mL (ref <5.0)
MORPHINE: NEGATIVE ng/mL (ref <2.5)
NORHYDROCODONE: NEGATIVE ng/mL (ref <2.5)
NOROXYCODONE: NEGATIVE ng/mL (ref <2.5)
Nicotine Metabolite: POSITIVE ng/mL — AB (ref <5.0)
Opiates: POSITIVE ng/mL — AB (ref <2.5)
Oxycodone: NEGATIVE ng/mL (ref <2.5)
Oxymorphone: NEGATIVE ng/mL (ref <2.5)
Phencyclidine: NEGATIVE ng/mL (ref <10)
TAPENTADOL: NEGATIVE ng/mL (ref <5.0)
Tramadol: NEGATIVE ng/mL (ref <5.0)
Zolpidem: NEGATIVE ng/mL (ref <5.0)

## 2017-02-06 LAB — DRUG TOX ALC METAB W/CON, ORAL FLD: Alcohol Metabolite: NEGATIVE ng/mL (ref <25)

## 2017-02-06 NOTE — Assessment & Plan Note (Addendum)
- PFT's 01/31/2011 FEV1  1.76 (60% ) ratio 68 and no better p B2,  DLCO 70% -med review 09/25/2012 > Dulera drug assistance  paperwork given    - HFA 90% 11/20/2012 .  -Med calendar 12/07/12 , 04/06/2013 , 04/18/2014,, 08/30/2014 , 11/08/2014  - PFT's 01/12/2013  1.73 (65%)  Ratio 73 and no better p B2, DLCO 86% - trial off singulair 02/08/13 >>>did not stop  - 04/05/2014  trial of dulera 100 instead of 200 > worse  -04/18/2014 increase Dulera 200  - 08/01/2014 p extensive coaching HFA effectiveness =    90% - 08/01/2014  Walked RA x 2laps @ 185 ft each stopped due to cough > sob/ no desat moderate pace  - spirometry 08/01/2014  FEV1  1.87 (70%) ratio 78  fef 25-75 53%  - spirometry 02/10/2015  flattening of top portion of f/v loop only    - 02/10/2015   Walked RA  2 laps @ 185 ft each stopped due to cough > sob/ slow pace   -  NO 03/24/2015   =  16 - Methacholine challenge 04/04/2015   POS asthma = wheeze/sob but no coughing fits > added qvar 80 2bid 06/29/2015   - Allergy profile 11/17/2015 >  Eos 0.1 /  IgE  80 POS RAST  to grass, ragweed, cedar tree  - 08/31/2015  After extensive coaching HFA effectiveness =    75% (Ti too short)  - Spirometry 08/31/2015  FEV1 0.75 (28%)  Ratio 68  - FENO 11/17/2015  =  8  on symb 160 2bid and qvar 80 2bid  - FENO 03/22/2016  = 36 on symb 160/qvar 80 though not able to verify being used as directed   - 02/05/2017  After extensive coaching HFA effectiveness =    75% from a baseline of 50%    DDX of  difficult airways management almost all start with A and  include Adherence, Ace Inhibitors, Acid Reflux, Active Sinus Disease, Alpha 1 Antitripsin deficiency, Anxiety masquerading as Airways dz,  ABPA,  Allergy(esp in young), Aspiration (esp in elderly), Adverse effects of meds,  Active smokers, A bunch of PE's (a small clot burden can't cause this syndrome unless there is already severe underlying pulm or vascular dz with poor reserve) plus two Bs  = Bronchiectasis and Beta blocker  use..and one C= CHF   Adherence is always the initial "prime suspect" and is a multilayered concern that requires a "trust but verify" approach in every patient - starting with knowing how to use medications, especially inhalers, correctly, keeping up with refills and understanding the fundamental difference between maintenance and prns vs those medications only taken for a very short course and then stopped and not refilled.  - see hfa teaching - med reconciliation has been a major challenge so should return with all meds in hand using a trust but verify approach to confirm accurate Medication  Reconciliation The principal here is that until we are certain that the  patients are doing what we've asked, it makes no sense to ask them to do more.    ? Acid (or non-acid) GERD > always difficult to exclude as up to 75% of pts in some series report no assoc GI/ Heartburn symptoms> rec continue max (24h)  acid suppression and diet restrictions/ reviewed    ? Allergies > reviewed/ avoidance/ contiute nasonex/ ics/ singulair for now though not really clear which if any she's really taking / helping or not   ?  Active sinus Dz > needs ct next ov if cough not better   ? Anxiety > usually at the bottom of this list of usual suspects but should be much higher on this pt's based on H and P and note already on psychotropics and at the very least may be contributing to non-adherence with meds   ? Aspirtation > ? Needs DgEs next    ? Beta blocker effects > Strongly prefer in this setting: Bystolic, the most beta -1  selective Beta blocker available in sample form, with bisoprolol the most selective generic choice  on the market.     I had an extended discussion with the patient reviewing all relevant studies completed to date and  lasting 25 minutes of a 40  minute officee visit to re-establish with me    re  severe non-specific but potentially very serious refractory respiratory symptoms of uncertain and  potentially multiple  etiologies.    See calendar for specific medication instructions and bring it back for each and every office visit for every healthcare provider you see.  Without it,  you may not receive the best quality medical care that we feel you deserve.  You will note that the calendar groups together  your maintenance  medications that are timed at particular times of the day.  Think of this as your checklist for what your doctor has instructed you to do until your next evaluation to see what benefit  there is  to staying on a consistent group of medications intended to keep you well.  The other group at the bottom is entirely up to you to use as you see fit  for specific symptoms that may arise between visits that require you to treat them on an as needed basis.  Think of this as your action plan or "what if" list.   Separating the top medications from the bottom group is fundamental to providing you adequate care going forward.

## 2017-02-10 ENCOUNTER — Other Ambulatory Visit: Payer: Self-pay | Admitting: Internal Medicine

## 2017-02-10 ENCOUNTER — Other Ambulatory Visit: Payer: Self-pay | Admitting: Oncology

## 2017-02-10 NOTE — Telephone Encounter (Signed)
Erica [name] notified medication was approved.

## 2017-02-11 ENCOUNTER — Other Ambulatory Visit: Payer: Self-pay | Admitting: Internal Medicine

## 2017-02-12 ENCOUNTER — Other Ambulatory Visit: Payer: Medicare Other

## 2017-02-12 ENCOUNTER — Ambulatory Visit (HOSPITAL_BASED_OUTPATIENT_CLINIC_OR_DEPARTMENT_OTHER): Payer: Medicare Other

## 2017-02-12 ENCOUNTER — Ambulatory Visit: Payer: Medicare Other

## 2017-02-12 ENCOUNTER — Other Ambulatory Visit (HOSPITAL_BASED_OUTPATIENT_CLINIC_OR_DEPARTMENT_OTHER): Payer: Medicare Other

## 2017-02-12 VITALS — BP 158/102 | HR 101 | Temp 98.2°F | Resp 18

## 2017-02-12 DIAGNOSIS — C50412 Malignant neoplasm of upper-outer quadrant of left female breast: Secondary | ICD-10-CM | POA: Diagnosis present

## 2017-02-12 DIAGNOSIS — Z171 Estrogen receptor negative status [ER-]: Secondary | ICD-10-CM

## 2017-02-12 DIAGNOSIS — Z5111 Encounter for antineoplastic chemotherapy: Secondary | ICD-10-CM | POA: Diagnosis present

## 2017-02-12 LAB — COMPREHENSIVE METABOLIC PANEL
ALBUMIN: 3.6 g/dL (ref 3.5–5.0)
ALK PHOS: 72 U/L (ref 40–150)
ALT: 26 U/L (ref 0–55)
AST: 21 U/L (ref 5–34)
Anion Gap: 8 mEq/L (ref 3–11)
BILIRUBIN TOTAL: 0.53 mg/dL (ref 0.20–1.20)
BUN: 11.1 mg/dL (ref 7.0–26.0)
CHLORIDE: 106 meq/L (ref 98–109)
CO2: 31 meq/L — AB (ref 22–29)
Calcium: 9.5 mg/dL (ref 8.4–10.4)
Creatinine: 1.2 mg/dL — ABNORMAL HIGH (ref 0.6–1.1)
GLUCOSE: 106 mg/dL (ref 70–140)
POTASSIUM: 3.6 meq/L (ref 3.5–5.1)
SODIUM: 145 meq/L (ref 136–145)
Total Protein: 7.7 g/dL (ref 6.4–8.3)

## 2017-02-12 LAB — CBC WITH DIFFERENTIAL/PLATELET
BASO%: 0.4 % (ref 0.0–2.0)
BASOS ABS: 0 10*3/uL (ref 0.0–0.1)
EOS ABS: 0.1 10*3/uL (ref 0.0–0.5)
EOS%: 1.5 % (ref 0.0–7.0)
HCT: 41.8 % (ref 34.8–46.6)
HGB: 13.8 g/dL (ref 11.6–15.9)
LYMPH%: 29.5 % (ref 14.0–49.7)
MCH: 28.7 pg (ref 25.1–34.0)
MCHC: 32.9 g/dL (ref 31.5–36.0)
MCV: 87.2 fL (ref 79.5–101.0)
MONO#: 0.4 10*3/uL (ref 0.1–0.9)
MONO%: 5.8 % (ref 0.0–14.0)
NEUT#: 4.1 10*3/uL (ref 1.5–6.5)
NEUT%: 62.8 % (ref 38.4–76.8)
Platelets: 213 10*3/uL (ref 145–400)
RBC: 4.8 10*6/uL (ref 3.70–5.45)
RDW: 14.6 % — ABNORMAL HIGH (ref 11.2–14.5)
WBC: 6.6 10*3/uL (ref 3.9–10.3)
lymph#: 1.9 10*3/uL (ref 0.9–3.3)

## 2017-02-12 MED ORDER — GOSERELIN ACETATE 3.6 MG ~~LOC~~ IMPL
3.6000 mg | DRUG_IMPLANT | Freq: Once | SUBCUTANEOUS | Status: AC
  Administered 2017-02-12: 3.6 mg via SUBCUTANEOUS
  Filled 2017-02-12: qty 3.6

## 2017-02-12 NOTE — Patient Instructions (Signed)
Goserelin injection What is this medicine? GOSERELIN (GOE se rel in) is similar to a hormone found in the body. It lowers the amount of sex hormones that the body makes. Men will have lower testosterone levels and women will have lower estrogen levels while taking this medicine. In men, this medicine is used to treat prostate cancer; the injection is either given once per month or once every 12 weeks. A once per month injection (only) is used to treat women with endometriosis, dysfunctional uterine bleeding, or advanced breast cancer. This medicine may be used for other purposes; ask your health care provider or pharmacist if you have questions. What should I tell my health care provider before I take this medicine? They need to know if you have any of these conditions (some only apply to women): -diabetes -heart disease or previous heart attack -high blood pressure -high cholesterol -kidney disease -osteoporosis or low bone density -problems passing urine -spinal cord injury -stroke -tobacco smoker -an unusual or allergic reaction to goserelin, hormone therapy, other medicines, foods, dyes, or preservatives -pregnant or trying to get pregnant -breast-feeding How should I use this medicine? This medicine is for injection under the skin. It is given by a health care professional in a hospital or clinic setting. Men receive this injection once every 4 weeks or once every 12 weeks. Women will only receive the once every 4 weeks injection. Talk to your pediatrician regarding the use of this medicine in children. Special care may be needed. Overdosage: If you think you have taken too much of this medicine contact a poison control center or emergency room at once. NOTE: This medicine is only for you. Do not share this medicine with others. What if I miss a dose? It is important not to miss your dose. Call your doctor or health care professional if you are unable to keep an appointment. What may  interact with this medicine? -female hormones like estrogen -herbal or dietary supplements like black cohosh, chasteberry, or DHEA -female hormones like testosterone -prasterone This list may not describe all possible interactions. Give your health care provider a list of all the medicines, herbs, non-prescription drugs, or dietary supplements you use. Also tell them if you smoke, drink alcohol, or use illegal drugs. Some items may interact with your medicine. What should I watch for while using this medicine? Visit your doctor or health care professional for regular checks on your progress. Your symptoms may appear to get worse during the first weeks of this therapy. Tell your doctor or healthcare professional if your symptoms do not start to get better or if they get worse after this time. Your bones may get weaker if you take this medicine for a long time. If you smoke or frequently drink alcohol you may increase your risk of bone loss. A family history of osteoporosis, chronic use of drugs for seizures (convulsions), or corticosteroids can also increase your risk of bone loss. Talk to your doctor about how to keep your bones strong. This medicine should stop regular monthly menstration in women. Tell your doctor if you continue to menstrate. Women should not become pregnant while taking this medicine or for 12 weeks after stopping this medicine. Women should inform their doctor if they wish to become pregnant or think they might be pregnant. There is a potential for serious side effects to an unborn child. Talk to your health care professional or pharmacist for more information. Do not breast-feed an infant while taking this medicine. Men should   inform their doctors if they wish to father a child. This medicine may lower sperm counts. Talk to your health care professional or pharmacist for more information. What side effects may I notice from receiving this medicine? Side effects that you should  report to your doctor or health care professional as soon as possible: -allergic reactions like skin rash, itching or hives, swelling of the face, lips, or tongue -bone pain -breathing problems -changes in vision -chest pain -feeling faint or lightheaded, falls -fever, chills -pain, swelling, warmth in the leg -pain, tingling, numbness in the hands or feet -signs and symptoms of low blood pressure like dizziness; feeling faint or lightheaded, falls; unusually weak or tired -stomach pain -swelling of the ankles, feet, hands -trouble passing urine or change in the amount of urine -unusually high or low blood pressure -unusually weak or tired Side effects that usually do not require medical attention (report to your doctor or health care professional if they continue or are bothersome): -change in sex drive or performance -changes in breast size in both males and females -changes in emotions or moods -headache -hot flashes -irritation at site where injected -loss of appetite -skin problems like acne, dry skin -vaginal dryness This list may not describe all possible side effects. Call your doctor for medical advice about side effects. You may report side effects to FDA at 1-800-FDA-1088. Where should I keep my medicine? This drug is given in a hospital or clinic and will not be stored at home. NOTE: This sheet is a summary. It may not cover all possible information. If you have questions about this medicine, talk to your doctor, pharmacist, or health care provider.    2016, Elsevier/Gold Standard. (2013-05-25 11:10:35)  

## 2017-02-17 ENCOUNTER — Other Ambulatory Visit: Payer: Self-pay | Admitting: Internal Medicine

## 2017-02-17 MED ORDER — ACETAMINOPHEN-CODEINE 300-60 MG PO TABS: 1.0000 | ORAL_TABLET | ORAL | 0 refills | Status: DC | PRN

## 2017-02-17 MED ORDER — MOMETASONE FUROATE 50 MCG/ACT NA SUSP: 2.0000 | Freq: Two times a day (BID) | NASAL | 11 refills | Status: DC

## 2017-03-03 ENCOUNTER — Emergency Department (HOSPITAL_COMMUNITY)
Admission: EM | Admit: 2017-03-03 | Discharge: 2017-03-03 | Disposition: A | Discharge status: Home / Self Care | Payer: Medicare Other | Attending: Emergency Medicine | Admitting: Emergency Medicine

## 2017-03-03 ENCOUNTER — Encounter: Payer: Medicare Other | Attending: Registered Nurse | Admitting: Registered Nurse

## 2017-03-03 ENCOUNTER — Encounter (HOSPITAL_COMMUNITY): Payer: Self-pay | Admitting: Emergency Medicine

## 2017-03-03 ENCOUNTER — Emergency Department (HOSPITAL_COMMUNITY): Payer: Medicare Other

## 2017-03-03 ENCOUNTER — Emergency Department (HOSPITAL_BASED_OUTPATIENT_CLINIC_OR_DEPARTMENT_OTHER): Payer: Medicare Other

## 2017-03-03 DIAGNOSIS — Z923 Personal history of irradiation: Secondary | ICD-10-CM | POA: Insufficient documentation

## 2017-03-03 DIAGNOSIS — I130 Hypertensive heart and chronic kidney disease with heart failure and stage 1 through stage 4 chronic kidney disease, or unspecified chronic kidney disease: Secondary | ICD-10-CM | POA: Insufficient documentation

## 2017-03-03 DIAGNOSIS — Z79899 Other long term (current) drug therapy: Secondary | ICD-10-CM | POA: Diagnosis not present

## 2017-03-03 DIAGNOSIS — I5032 Chronic diastolic (congestive) heart failure: Secondary | ICD-10-CM | POA: Insufficient documentation

## 2017-03-03 DIAGNOSIS — I10 Essential (primary) hypertension: Secondary | ICD-10-CM | POA: Insufficient documentation

## 2017-03-03 DIAGNOSIS — J45909 Unspecified asthma, uncomplicated: Secondary | ICD-10-CM | POA: Insufficient documentation

## 2017-03-03 DIAGNOSIS — G629 Polyneuropathy, unspecified: Secondary | ICD-10-CM | POA: Insufficient documentation

## 2017-03-03 DIAGNOSIS — M25571 Pain in right ankle and joints of right foot: Secondary | ICD-10-CM | POA: Diagnosis not present

## 2017-03-03 DIAGNOSIS — Z9221 Personal history of antineoplastic chemotherapy: Secondary | ICD-10-CM | POA: Insufficient documentation

## 2017-03-03 DIAGNOSIS — G4733 Obstructive sleep apnea (adult) (pediatric): Secondary | ICD-10-CM | POA: Insufficient documentation

## 2017-03-03 DIAGNOSIS — Z853 Personal history of malignant neoplasm of breast: Secondary | ICD-10-CM | POA: Insufficient documentation

## 2017-03-03 DIAGNOSIS — M79609 Pain in unspecified limb: Secondary | ICD-10-CM | POA: Diagnosis not present

## 2017-03-03 DIAGNOSIS — N189 Chronic kidney disease, unspecified: Secondary | ICD-10-CM | POA: Diagnosis not present

## 2017-03-03 DIAGNOSIS — M79671 Pain in right foot: Secondary | ICD-10-CM | POA: Diagnosis not present

## 2017-03-03 DIAGNOSIS — K219 Gastro-esophageal reflux disease without esophagitis: Secondary | ICD-10-CM | POA: Insufficient documentation

## 2017-03-03 MED ORDER — CLONIDINE HCL 0.1 MG PO TABS
0.3000 mg | ORAL_TABLET | Freq: Once | ORAL | Status: AC
  Administered 2017-03-03: 0.3 mg via ORAL
  Filled 2017-03-03: qty 1

## 2017-03-03 NOTE — Discharge Instructions (Signed)
Please read and follow all provided instructions.  You have been seen today for right ankle pain  Tests performed today include: An x-ray of the affected area - does NOT show any broken bones or dislocations.  Right lower extremity ultrasound- does NOT show any blood clots.  Vital signs. See below for your results today.   Home care instructions: -- *PRICE in the first 24-48 hours after injury: Protect (with brace, splint, sling), if given by your provider Rest Ice- Do not apply ice pack directly to your skin, place towel or similar between your skin and ice/ice pack. Apply ice for 20 min, then remove for 40 min while awake Compression- Wear brace, elastic bandage, splint as directed by your provider Elevate affected extremity above the level of your heart when not walking around for the first 24-48 hours   Use Tylenol as needed for pain.   Follow-up instructions: Please follow-up with your primary care provider or the provided orthopedic physician (bone specialist) if you continue to have significant pain in 1 week. In this case you may have a more severe injury that requires further care.   Return instructions:  Please return if your toes or feet are numb or tingling, appear gray or blue, or you have severe pain (also elevate the leg and loosen splint or wrap if you were given one) Please return to the Emergency Department if you experience worsening symptoms.  Please return if you have any other emergent concerns. Additional Information:  Your vital signs today were: BP (!) 175/95    Pulse 91    Temp 98.4 F (36.9 C) (Oral)    Resp 16    SpO2 98%  If your blood pressure (BP) was elevated above 135/85 this visit, please have this repeated by your doctor within one month. ---------------

## 2017-03-03 NOTE — ED Notes (Signed)
Patient still not in room 8. Called patient's name in lobby and patient answered. Patient must have been taken back to lobby after xray

## 2017-03-03 NOTE — ED Notes (Signed)
Patient name called x 2 in lobby no response

## 2017-03-03 NOTE — ED Notes (Signed)
Xray called to see if patient is there. Patient was there. Xray to return patient to room 8.

## 2017-03-03 NOTE — ED Notes (Signed)
edp aware of bp  

## 2017-03-03 NOTE — ED Provider Notes (Signed)
[place] EMERGENCY DEPARTMENT Provider Note   CSN: [id number] Arrival date & time: 03/03/17  1359     History   Chief Complaint Chief Complaint  Patient presents with  . Ankle Pain    HPI Erica [name] is a 41 y.o. female with a history of hypertension, breast cancer, neuropathy who presents the emergency department today for lower extremity pain.  Patient notes that on Friday morning she awoke with right ankle pain that she describes as a dull, achy pain on the dorsal aspect of her right foot.  She has the pain is worse with inversion and eversion of the ankle.  The pain is increased with ambulation.  She has been compensating by walking on her heel for relief.  She has numbness and tingling at baseline due to her neuropathy from chemotherapy.  No changes.  She has not been taking anything for this.  She notes the area was initially swollen but has been decreasing since initial onset.  The patient is currently done with chemotherapy but has not been told she has breast cancer free at this time. Notes she is currently getting monthly estrogen injections. She denies trauma or increase in activity. No fever, chills, surrounding erythema, chest pain or shortness of breath.   HPI  Past Medical History:  Diagnosis Date  . Asthma   . Breast cancer ([place]) 2010   a. locally advanced left breast carcinoma diagnosed in 2010 and treated with neoadjuvant chemotherapy with docetaxel and cyclophosphamide as well as paclitaxel. This was followed by radiation therapy which was completed in 2011.  Marland Kitchen Dyspnea on exertion    a. 01/2013 Lexi MV: EF 54%, no ischemia/infarct;  b. 05/2013 Echo: EF 60-65%, no rwma, Gr 2 DD;  b.   . Fatty liver   . Gastroparesis   . GERD (gastroesophageal reflux disease)   . Hypertension   . Impaired glucose tolerance 04/13/2011  . Lymphadenitis, chronic    restricted LEFT extremity  . 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[place]) 09/27/2010  . OSA (obstructive sleep  apnea) 05/04/2014   AHI 31/hr now on CPAP at 12cm H2O  . Personal history of chemotherapy 2010  . Personal history of radiation therapy 2010    Patient Active Problem List   Diagnosis Date Noted  . Ovarian cyst 12/09/2016  . 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 Generalized abdominal tenderness without rebound tenderness 10/15/2016  . Amenorrhea 10/15/2016  . Asthma without status asthmaticus 01/19/2016  . Chronic kidney disease 01/19/2016  . Lymphedema 01/19/2016  . Hypertensive heart disease 09/26/2015  . 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 Hypertriglyceridemia without hypercholesterolemia 06/15/2015  . Hypokalemia 06/15/2015  . Insomnia secondary to anxiety 04/05/2015  . Idiopathic intracranial hypertension 10/21/2014  . Upper airway cough syndrome 10/09/2014  . Hypertension 10/09/2014  . Mild obesity 10/09/2014  . Apnea, sleep 05/04/2014  . DUB (dysfunctional uterine bleeding) 04/07/2014  . Daytime somnolence 02/10/2014  . Chronic diastolic CHF (congestive heart failure) ([place]) 06/23/2013  . Malignant neoplasm of upper-outer quadrant of left breast in female, estrogen receptor negative ([place]) 01/07/2013  . Gastroparesis 01/04/2013  . Axillary hidradenitis suppurativa 08/01/2011  . Preventative health care 04/13/2011  . Hyperglycemia 04/13/2011  . GERD (gastroesophageal reflux disease) 03/18/2011  . Cough variant asthma 09/27/2010    Past Surgical History:  Procedure Laterality Date  . BRAIN SURGERY     [place]  . BREAST LUMPECTOMY Left 09/2008  . CARPAL TUNNEL RELEASE Left 2011  . LYMPH NODE DISSECTION Left  2010   breast; 2 wks after breast lumpectomy  . TENDON RELEASE Right 2012   "hand"  . TUBAL LIGATION  05/11/2011   Procedure: POST PARTUM TUBAL LIGATION;  Surgeon: Emeterio Reeve, MD;  Location: [facility];  Service: Gynecology;  Laterality: Bilateral;  Induced for HTN    OB History    Gravida Para Term Preterm AB Living   5 4 4   1 4     SAB TAB Ectopic Multiple Live Births   1       4       Home Medications    Prior to Admission medications   Medication Sig Start Date End Date Taking? Authorizing Provider  acetaminophen-codeine (TYLENOL #4) 300-60 MG tablet Take 1 tablet every 4 (four) hours as needed by mouth for pain. 02/17/17   Tanda Rockers, MD  acetaZOLAMIDE (DIAMOX) 250 MG tablet Take 4 tablets (1,000 mg total) by mouth 3 (three) times daily. 10/25/16   Patel, Arvin Collard K, DO  albuterol (PROVENTIL) (2.5 MG/3ML) 0.083% nebulizer solution USE 1 VIAL VIA NEBULIZER EVERY 4 HOURS AND AS NEEDED FOR WHEEZING OR SHORTNESS OF BREATH 12/17/16   Parrett, Tammy S, NP  albuterol (VENTOLIN HFA) 108 (90 Base) MCG/ACT inhaler INHALE 2 PUFFS INTO THE LUNGS EVERY 4 HOURS AS NEEDED FOR WHEEZING OR SHORTNESS OF BREATH 02/11/17   Tanda Rockers, MD  amitriptyline (ELAVIL) 100 MG tablet Take 100 mg by mouth at bedtime.    [provider]  amLODipine (NORVASC) 10 MG tablet TAKE 1 TABLET BY MOUTH EVERY DAY 01/07/17   Rosemarie Ax, MD  Biotin 10000 MCG TABS Take 1 tablet by mouth daily.    [provider]  budesonide-formoterol (SYMBICORT) 160-4.5 MCG/ACT inhaler INHALE 2 PUFFS INTO LUNGS 2 TIMES DAILY AS DIRECTED 02/05/17   Tanda Rockers, MD  carvedilol (COREG) 6.25 MG tablet Take 1 tablet (6.25 mg total) 2 (two) times daily with a meal by mouth. 02/04/17   [name], MD  chlorpheniramine (CHLOR-TRIMETON) 4 MG tablet Take 8 mg by mouth at bedtime.    [provider]  chlorthalidone (HYGROTON) 25 MG tablet Take 1 tablet (25 mg total) daily by mouth. 02/04/17   [name], MD  cloNIDine (CATAPRES) 0.3 MG tablet TAKE 1 TABLET BY MOUTH TWICE A DAY 10/10/16   [name], MD  DEXILANT 60 MG capsule TAKE 1 CAPSULE BY MOUTH ONCE DAILY 07/15/16   Pyrtle, Lajuan Lines, MD  dextromethorphan-guaiFENesin [facility] DM) 30-600 MG 12hr tablet Take 1 tablet by mouth 2 (two) times daily as needed for cough.    [provider]  dicyclomine (BENTYL) 20 MG tablet TAKE 1 TABLET BY MOUTH 3 TIMES DAILY AS NEEDED FOR SPASMS 09/30/16   Pyrtle, Lajuan Lines, MD  DULoxetine (CYMBALTA) 30 MG capsule Take 1 capsule (30 mg total) by mouth daily. 10/25/16   Narda Amber K, DO  DULoxetine (CYMBALTA) 60 MG capsule Take 1 capsule (60 mg total) by mouth daily. 10/25/16   Patel, Donika K, DO  EPINEPHRINE 0.3 mg/0.3 mL IJ SOAJ injection INJECT 0.3 MLS INTO THE MUSCLE ONCE AS NEEDED FOR ANAPHYLAXIS 12/10/16   [name], MD  fluticasone [facility]) 50 MCG/ACT nasal spray Place 2 sprays into both nostrils daily. 04/29/16   Kinnie Feil, PA-C  fluticasone (FLOVENT HFA) 110 MCG/ACT inhaler Inhale 2 puffs into the lungs 2 (two) times daily. 10/30/16   Tanda Rockers, MD  hydrALAZINE (APRESOLINE) 25 MG tablet Take 1 tablet (  25 mg total) 3 (three) times daily by mouth. 02/04/17   [name], MD  lidocaine-prilocaine (EMLA) cream APPLY TOPICALLY AS NEEDED. 02/11/17   Magrinat, Virgie Dad, MD  metoCLOPramide (REGLAN) 10 MG tablet TAKE 1 TABLET BY MOUTH 4 TIMES A DAY WITH MEALS AND AT BEDTIME 02/11/17   Pyrtle, Lajuan Lines, MD  mometasone (NASONEX) 50 MCG/ACT nasal spray Place 2 sprays 2 (two) times daily into the nose. 02/17/17   Tanda Rockers, MD  Mometasone Furoate [facility] HFA) 200 MCG/ACT AERO Inhale 2 puffs every 12 (twelve) hours into the lungs. 02/05/17   Tanda Rockers, MD  montelukast (SINGULAIR) 10 MG tablet TAKE 1 TABLET BY MOUTH ONCE DAILY AT BEDTIME 02/11/17   Tanda Rockers, MD  NITROSTAT 0.4 MG SL tablet PLACE 1 TABLET UNDER TONGUE EVERY 5 MINUTES AS NEEDED FOR CHEST PAIN, MAX OF 3 TABLETS THEN CALL 911 IF PAIN PERSISTS 10/26/15   [name], MD  OXYGEN Use CPAP at bedtime    [provider]  potassium chloride SA (K-DUR,KLOR-CON) 20 MEQ tablet Take 2 tablets (40 mEq total) by mouth daily. Patient taking differently: Take 20 mEq by mouth 2 (two) times daily.  04/04/16   [name], MD  pregabalin  (LYRICA) 200 MG capsule Take 1 capsule (200 mg total) by mouth 3 (three) times daily. 10/25/16   Narda Amber K, DO  primidone (MYSOLINE) 50 MG tablet Take 0.5 tablets (25 mg total) by mouth at bedtime. 10/25/16   Patel, Donika K, DO  RANEXA 500 MG 12 hr tablet TAKE 1 TABLET BY MOUTH TWICE DAILY 10/18/16   [name], MD  Respiratory Therapy Supplies (FLUTTER) DEVI Use as directed 03/24/15   Tanda Rockers, MD  tapentadol HCl (NUCYNTA) 75 MG tablet Take 1 tablet (75 mg total) every 6 (six) hours as needed by mouth. 02/03/17   Kirsteins, Luanna Salk, MD  topiramate (TOPAMAX) 50 MG tablet Take 1 tablet in the monring and 2 at bedtime. Patient taking differently: 25 mg 2 (two) times daily. Take 1 tablet in the monring and 2 at bedtime. 04/29/16   Patel, Arvin Collard K, DO  traMADol (ULTRAM) 50 MG tablet Take 1 tablet (50 mg total) by mouth every 6 (six) hours as needed. 10/23/16   Magrinat, Virgie Dad, MD  traZODone (DESYREL) 150 MG tablet TAKE 1 TABLET BY MOUTH AT BEDTIME 01/07/17   Rosemarie Ax, MD    Family History Family History  Problem Relation Age of Onset  . Other Mother 13       breast calcifications treated with surgery, breast cancer pill, and radiation  . Fibroids Sister 28       s/p TAH-BSO  . Diabetes Maternal Grandmother   . Heart disease Maternal Grandmother   . Hypertension Maternal Grandmother   . Breast cancer Other        maternal great aunt (MGM's sister)  . Breast cancer Other        paternal great aunt dx middle ages; s/p mastectomy  . Cancer Neg Hx   . Alcohol abuse Neg Hx   . Early death Neg Hx   . Hyperlipidemia Neg Hx   . Kidney disease Neg Hx   . Stroke Neg Hx   . Colon cancer Neg Hx     Social History Social History   Tobacco Use  . Smoking status: Never Smoker  . Smokeless tobacco: Never Used  Substance Use Topics  . Alcohol use: Yes  Alcohol/week: 0.0 oz    Comment: occasionally/socially  . Drug use: No     Allergies   Aspirin; Doxycycline;  Kiwi extract; Penicillins; Strawberry extract; and Sulfamethoxazole-trimethoprim   Review of Systems Review of Systems  Constitutional: Negative for chills and fever.  Musculoskeletal: Positive for arthralgias and gait problem.  Skin: Negative for color change.  Neurological: Negative for weakness.  All other systems reviewed and are negative.    Physical Exam Updated Vital Signs BP (!) 175/95   Pulse 91   Temp 98.4 F (36.9 C) (Oral)   Resp 16   SpO2 98%   Physical Exam  Constitutional: She appears well-developed and well-nourished.  HENT:  Head: Normocephalic and atraumatic.  Right Ear: External ear normal.  Left Ear: External ear normal.  Eyes: Conjunctivae are normal. Right eye exhibits no discharge. Left eye exhibits no discharge. No scleral icterus.  Cardiovascular:  Pulses:      Dorsalis pedis pulses are 2+ on the right side, and 2+ on the left side.       Posterior tibial pulses are 2+ on the right side, and 2+ on the left side.  Pulmonary/Chest: Effort normal. No respiratory distress.  Musculoskeletal:       Right knee: She exhibits normal range of motion. No tenderness found.       Right ankle: She exhibits normal range of motion, no swelling and normal pulse. Tenderness. Achilles tendon normal. Achilles tendon exhibits no pain, no defect and normal Thompson's test results.       Right foot: There is normal range of motion, no tenderness, no swelling and normal capillary refill.       Feet:  No lower extremity swelling.  Lower extremities are symmetrical in size bilaterally.  No tenderness palpation of calves bilaterally.    Neurological: She is alert. She has normal strength.  Intact ankle dorsiflexion and plantarflexion strength b/l. Gait able but noted to be painful.   Skin: Skin is warm and dry. No erythema. No pallor.  No erythema or heat of the ankle or foot.  No joint swelling.  Psychiatric: She has a normal mood and affect.  Nursing note and vitals  reviewed.    ED Treatments / Results  Labs (all labs ordered are listed, but only abnormal results are displayed) Labs Reviewed - No data to display  EKG  EKG Interpretation None       Radiology Dg Foot Complete Right  Result Date: 03/03/2017 CLINICAL DATA:  One day of generalized right foot pain with no known injury EXAM: RIGHT FOOT COMPLETE - 3+ VIEW COMPARISON:  Right foot series of March 22, 2013 FINDINGS: The bones are subjectively adequately mineralized. No acute fracture or dislocation is observed. The joint spaces are well maintained. The soft tissues are unremarkable. IMPRESSION: There is no acute or significant chronic bony abnormality of the right foot. Electronically Signed   By: David  Martinique M.D.   On: 03/03/2017 15:27    Procedures Procedures (including critical care time)  Medications Ordered in ED Medications - No data to display   Initial Impression / Assessment and Plan / ED Course  I have reviewed the triage vital signs and the nursing notes.  Pertinent labs & imaging results that were available during my care of the patient were reviewed by me and considered in my medical decision making (see chart for details).     41 y.o. female with atraumatic right ankle pain.  Patient notes that she has pain on the dorsal  aspect of the ankle that is worse with inversion and eversion of the ankle.  She notes she can still ambulate but has to apply most of her weight on her heel in order to do so.  Foot x-rays taken in triage unremarkable.  Patient's pain appears to be more localized to the ankle region.  Will order x-rays of this to evaluate.  Patient has neuropathy at baseline.  No change.  Dorsalis pedis and posterior tib pulses 2+.  Cap refill less than 2 seconds.  Do not suspect septic joint as the patient has no joint swelling, erythema and is able to range the joint without difficulty.  Concern for DVT with atraumatic onset of lower extremity pain and patient  being breast cancer positive with monthly estrogen replacement shots.  Will order lower extremity doppler U/S to evaluate. Case signed out to [facility], PA-C with [place] and xrays pending.  If ultrasound and x-rays negative plan to place patient in a cam walker boot and prescribed price therapy.  Of ultrasound or x-rays positive, treat appropriately.  Final Clinical Impressions(s) / ED Diagnoses   Final diagnoses:  None    ED Discharge Orders    None       Lorelle Gibbs 03/03/17 1708    Dorie Rank, MD 03/04/17 [phone]

## 2017-03-03 NOTE — ED Notes (Signed)
ED Provider at bedside. 

## 2017-03-03 NOTE — Progress Notes (Signed)
Orthopedic Tech Progress Note Patient Details:  Erica [name] 11-30-1975 [id number]  Ortho Devices Type of Ortho Device: CAM walker, Crutches Ortho Device/Splint Location: RLE Ortho Device/Splint Interventions: Ordered, Application, Adjustment   Post Interventions Patient Tolerated: Well Instructions Provided: Care of device   Braulio Bosch 03/03/2017, 7:27 PM

## 2017-03-03 NOTE — ED Notes (Signed)
Ortho at bedside.

## 2017-03-03 NOTE — Progress Notes (Signed)
Right lower extremity venous duplex completed. No evidence of DVT, superficial thrombosis, or Baker's cyst. Toma Copier, RVS 03/03/2017 5:05 PM

## 2017-03-03 NOTE — ED Provider Notes (Signed)
17:10 Received sign out from Gaylyn Lambert PA-C  Patient is a 41 year old female with a history of hypertension, breast cancer-receiving estrogen therapy, and chemotherapy-induced neuropathy who presented to the emergency department with complaints of atraumatic right ankle pain for the past few days.  Patient has numbness and tingling at baseline due to her neuropathy from chemotherapy, reports no changes in this.  Patient is diffusely tender to the medial and lateral ankle.  X-ray negative for fracture or dislocation. 2+ DP pulses, capillary refill <2 seconds. Baseline neuropathy.   Patient signed out to me with lower extremity ultrasound to rule out DVT pending.  If negative discussed with Legrand Como that patient can be discharged home in walking boot with RICE therapy.   19:10: Re-eval: [place] negative for DVT- discussed this with patient. Will apply walking boot. Patient requesting crutches will provide these as well. Patient offered pain medication prior to DC and declined.   Patient's blood pressure elevated in the emergency department today. She has a hx of HTN and states she missed her afternoon dose of her Clonidine- will provide this prior to discharge. Blood pressure remains elevated at time of discharge- patient states this is typical for her, review of patient's chart reveals several elevated blood pressure values at office visits. Patient denies chest pain, dyspnea, vision change, dizziness, lightheadedness, numbness, or weakness therefore doubt hypertensive emergency. Discussed elevated blood pressure with the patient and the need for primary care follow up with potential need to change antihypertensive medications and or for further evaluation. Discussed return precaution signs/symptoms for hypertensive emergency as listed above with the patient. She confirmed understanding.      Leafy Kindle 03/04/17 0136    Dorie Rank, MD 03/04/17 [phone]

## 2017-03-03 NOTE — ED Notes (Signed)
BP noted to be high. States hx of hypertension. States "its always high." states has an appt today with her doctor.

## 2017-03-03 NOTE — ED Triage Notes (Addendum)
Pt to ER for 3-4 days of right ankle pain. No injury. No swelling noted. States pain with moving the foot ankle side to side.

## 2017-03-07 ENCOUNTER — Ambulatory Visit (INDEPENDENT_AMBULATORY_CARE_PROVIDER_SITE_OTHER): Payer: Medicare Other | Admitting: Adult Health

## 2017-03-07 ENCOUNTER — Other Ambulatory Visit: Payer: Self-pay | Admitting: *Deleted

## 2017-03-07 ENCOUNTER — Encounter: Payer: Self-pay | Admitting: Adult Health

## 2017-03-07 DIAGNOSIS — K219 Gastro-esophageal reflux disease without esophagitis: Secondary | ICD-10-CM

## 2017-03-07 DIAGNOSIS — R05 Cough: Secondary | ICD-10-CM

## 2017-03-07 DIAGNOSIS — J4540 Moderate persistent asthma, uncomplicated: Secondary | ICD-10-CM | POA: Diagnosis not present

## 2017-03-07 DIAGNOSIS — R058 Other specified cough: Secondary | ICD-10-CM

## 2017-03-07 MED ORDER — CLOTRIMAZOLE 10 MG MT TROC: 10.0000 mg | Freq: Every day | OROMUCOSAL | 0 refills | Status: DC

## 2017-03-07 MED ORDER — MOMETASONE FUROATE 200 MCG/ACT IN AERO: 2.0000 | INHALATION_SPRAY | Freq: Two times a day (BID) | RESPIRATORY_TRACT | 0 refills | Status: DC

## 2017-03-07 MED ORDER — METOCLOPRAMIDE HCL 10 MG PO TABS: ORAL_TABLET | ORAL | 0 refills | Status: DC

## 2017-03-07 NOTE — Progress Notes (Signed)
@Patient  ID: Erica [name], female    DOB: 01/16/1976, 41 y.o.   MRN: [id number]  No chief complaint on file.   Referring provider: [name], MD  HPI: 19 yobf with asthma all her life much worse since around 07/2010 referred by Dr [name] to pulmonary clinic in Sept 2012 Hx of Breast Cancer 05/2008 s/p chemo/mastectomy /XRT   TEST /Events  PFT's 01/31/2011 FEV1  1.76 (60% ) ratio 68 and no better p B2,  DLCO 70% -med review 09/25/2012 > Dulera drug assistance  paperwork given    - HFA 90% 11/20/2012 .  -Med calendar 12/07/12 , 04/06/2013 , 04/18/2014,, 08/30/2014 , 11/08/2014  - PFT's 01/12/2013  1.73 (65%)  Ratio 73 and no better p B2, DLCO 86% - trial off singulair 02/08/13 >>>did not stop  - 04/05/2014  trial of dulera 100 instead of 200 > worse  -04/18/2014 increase Dulera 200  - 08/01/2014 p extensive coaching HFA effectiveness =    90% - 08/01/2014  Walked RA x 2laps @ 185 ft each stopped due to cough > sob/ no desat moderate pace  - spirometry 08/01/2014  FEV1  1.87 (70%) ratio 78  fef 25-75 53%  - spirometry 02/10/2015  flattening of top portion of f/v loop only    - 02/10/2015   Walked RA  2 laps @ 185 ft each stopped due to cough > sob/ slow pace   -  NO 03/24/2015   =  16 - Methacholine challenge 04/04/2015   POS asthma = wheeze/sob but no coughing fits > added qvar 80 2bid 06/29/2015 >>>   -flare sarting Aug 19 2015 p exp to storage shed  - 08/31/2015  After extensive coaching HFA effectiveness =    75% (Ti too short)  - Spirometry 08/31/2015  FEV1 0.75 (28%)  Ratio 68  - FENO 11/17/2015  =  8  on symb 160 2bid and qvar 80 2bid  - FENO 03/22/2016  = 36 on symb 160/qvar 80 though not able to verify being used as directed  -MRI 05/18/15 > Paranasal sinuses are clear - 11/17/2015 rec increase h1 to 2 hs to help with noct cough  - Allergy profile 11/17/2015 >  Eos 0.1 /  IgE  80 POS RAST  to grass, ragweed, cedar tree   03/07/2017 Follow up : Asthma /Med Review  Pt returns for 1 month follow  up . She was changed from Flovent to Asmanex along with her Symbicort . Does feel this has helped her breathing. Not as sob. Still has daily cough ,back to baseline.  No discolored mucus , fever, chest pain or orthopnea.   Does have some white coating on tongue . Feels she is developing thrush. We discussed oral care with inhalers.   Reviewed her meds and organized them into a med calendar with pt education .  Pt education on narcotic use. She is seen at pain clinic .    Allergies  Allergen Reactions  . Aspirin Shortness Of Breath and Palpitations    Pt can take ibuprofen without reaction  . Doxycycline Anaphylaxis  . Kiwi Extract Itching and Swelling    Lip swelling  . Penicillins Shortness Of Breath and Palpitations    Has patient had a PCN reaction causing immediate rash, facial/tongue/throat swelling, SOB or lightheadedness with hypotension: Yes Has patient had a PCN reaction causing severe rash involving mucus membranes or skin necrosis: Yes Has patient had a PCN reaction that required hospitalization Yes Has patient  had a PCN reaction occurring within the last 10 years: No If all of the above answers are "NO", then may proceed with Cephalosporin use.  . Strawberry Extract Hives  . Sulfamethoxazole-Trimethoprim Anaphylaxis    Facial, throat and tongue swelling with difficulty swallowing.    Immunization History  Administered Date(s) Administered  . Influenza Split 12/31/2010, 03/02/2012, 12/01/2015  . Influenza,inj,Quad PF,6+ Mos 02/09/2013, 02/02/2014, 12/06/2014, 12/05/2015, 02/04/2017  . Pneumococcal Conjugate-13 12/01/2014  . Pneumococcal Polysaccharide-23 03/02/2012  . Tdap 05/12/2011    Past Medical History:  Diagnosis Date  . Asthma   . Breast cancer ([place]) 2010   a. locally advanced left breast carcinoma diagnosed in 2010 and treated with neoadjuvant chemotherapy with docetaxel and cyclophosphamide as well as paclitaxel. This was followed by radiation therapy which  was completed in 2011.  Marland Kitchen Dyspnea on exertion    a. 01/2013 Lexi MV: EF 54%, no ischemia/infarct;  b. 05/2013 Echo: EF 60-65%, no rwma, Gr 2 DD;  b.   . Fatty liver   . Gastroparesis   . GERD (gastroesophageal reflux disease)   . Hypertension   . Impaired glucose tolerance 04/13/2011  . Lymphadenitis, chronic    restricted LEFT extremity  . 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 OSA (obstructive sleep apnea) 05/04/2014   AHI 31/hr now on CPAP at 12cm H2O  . Personal history of chemotherapy 2010  . Personal history of radiation therapy 2010    Tobacco History: Social History   Tobacco Use  Smoking Status Never Smoker  Smokeless Tobacco Never Used   Counseling given: Not Answered   Outpatient Encounter Medications as of 03/07/2017  Medication Sig  . acetaminophen-codeine (TYLENOL #4) 300-60 MG tablet Take 1 tablet every 4 (four) hours as needed by mouth for pain.  Marland Kitchen acetaZOLAMIDE (DIAMOX) 250 MG tablet Take 4 tablets (1,000 mg total) by mouth 3 (three) times daily.  Marland Kitchen albuterol (PROVENTIL) (2.5 MG/3ML) 0.083% nebulizer solution USE 1 VIAL VIA NEBULIZER EVERY 4 HOURS AND AS NEEDED FOR WHEEZING OR SHORTNESS OF BREATH  . albuterol (VENTOLIN HFA) 108 (90 Base) MCG/ACT inhaler INHALE 2 PUFFS INTO THE LUNGS EVERY 4 HOURS AS NEEDED FOR WHEEZING OR SHORTNESS OF BREATH  . amitriptyline (ELAVIL) 100 MG tablet Take 100 mg by mouth at bedtime.  Marland Kitchen amLODipine (NORVASC) 10 MG tablet TAKE 1 TABLET BY MOUTH EVERY DAY  . Biotin 10000 MCG TABS Take 1 tablet by mouth daily.  . budesonide-formoterol (SYMBICORT) 160-4.5 MCG/ACT inhaler INHALE 2 PUFFS INTO LUNGS 2 TIMES DAILY AS DIRECTED  . carvedilol (COREG) 6.25 MG tablet Take 1 tablet (6.25 mg total) 2 (two) times daily with a meal by mouth.  . chlorpheniramine (CHLOR-TRIMETON) 4 MG tablet Take 8 mg by mouth at bedtime.  . chlorthalidone (HYGROTON) 25 MG tablet Take 1 tablet (25 mg total) daily by mouth.  . cloNIDine (CATAPRES) 0.3 MG tablet TAKE 1  TABLET BY MOUTH TWICE A DAY  . DEXILANT 60 MG capsule TAKE 1 CAPSULE BY MOUTH ONCE DAILY  . dextromethorphan-guaiFENesin (MUCINEX DM) 30-600 MG 12hr tablet Take 1 tablet by mouth 2 (two) times daily as needed for cough.  . dicyclomine (BENTYL) 20 MG tablet TAKE 1 TABLET BY MOUTH 3 TIMES DAILY AS NEEDED FOR SPASMS  . DULoxetine (CYMBALTA) 30 MG capsule Take 1 capsule (30 mg total) by mouth daily.  . DULoxetine (CYMBALTA) 60 MG capsule Take 1 capsule (60 mg total) by mouth daily.  Marland Kitchen EPINEPHRINE 0.3 mg/0.3 mL IJ SOAJ injection INJECT 0.3 MLS  INTO THE MUSCLE ONCE AS NEEDED FOR ANAPHYLAXIS  . fluticasone (FLONASE) 50 MCG/ACT nasal spray Place 2 sprays into both nostrils daily.  . hydrALAZINE (APRESOLINE) 25 MG tablet Take 1 tablet (25 mg total) 3 (three) times daily by mouth.  . lidocaine-prilocaine (EMLA) cream APPLY TOPICALLY AS NEEDED.  Marland Kitchen metoCLOPramide (REGLAN) 10 MG tablet TAKE 1 TABLET BY MOUTH 4 TIMES A DAY WITH MEALS AND AT BEDTIME  . mometasone (NASONEX) 50 MCG/ACT nasal spray Place 2 sprays 2 (two) times daily into the nose.  . Mometasone Furoate (ASMANEX HFA) 200 MCG/ACT AERO Inhale 2 puffs every 12 (twelve) hours into the lungs.  . montelukast (SINGULAIR) 10 MG tablet TAKE 1 TABLET BY MOUTH ONCE DAILY AT BEDTIME  . NITROSTAT 0.4 MG SL tablet PLACE 1 TABLET UNDER TONGUE EVERY 5 MINUTES AS NEEDED FOR CHEST PAIN, MAX OF 3 TABLETS THEN CALL 911 IF PAIN PERSISTS  . OXYGEN Use CPAP at bedtime  . potassium chloride SA (K-DUR,KLOR-CON) 20 MEQ tablet Take 2 tablets (40 mEq total) by mouth daily. (Patient taking differently: Take 20 mEq by mouth 2 (two) times daily. )  . primidone (MYSOLINE) 50 MG tablet Take 0.5 tablets (25 mg total) by mouth at bedtime.  Marland Kitchen RANEXA 500 MG 12 hr tablet TAKE 1 TABLET BY MOUTH TWICE DAILY  . Respiratory Therapy Supplies (FLUTTER) DEVI Use as directed  . tapentadol HCl (NUCYNTA) 75 MG tablet Take 1 tablet (75 mg total) every 6 (six) hours as needed by mouth.  .  topiramate (TOPAMAX) 50 MG tablet Take 1 tablet in the monring and 2 at bedtime. (Patient taking differently: 25 mg 2 (two) times daily. Take 1 tablet in the monring and 2 at bedtime.)  . traMADol (ULTRAM) 50 MG tablet Take 1 tablet (50 mg total) by mouth every 6 (six) hours as needed.  . traZODone (DESYREL) 150 MG tablet TAKE 1 TABLET BY MOUTH AT BEDTIME  . [DISCONTINUED] fluticasone (FLOVENT HFA) 110 MCG/ACT inhaler Inhale 2 puffs into the lungs 2 (two) times daily. (Patient not taking: Reported on 03/07/2017)  . [DISCONTINUED] pregabalin (LYRICA) 200 MG capsule Take 1 capsule (200 mg total) by mouth 3 (three) times daily. (Patient not taking: Reported on 03/07/2017)   No facility-administered encounter medications on file as of 03/07/2017.      Review of Systems  Constitutional:   No  weight loss, night sweats,  Fevers, chills,  +fatigue, or  lassitude.  HEENT:   No headaches,  Difficulty swallowing,  Tooth/dental problems, or  Sore throat,                No sneezing, itching, ear ache, nasal congestion, post nasal drip,   CV:  No chest pain,  Orthopnea, PND, swelling in lower extremities, anasarca, dizziness, palpitations, syncope.   GI  No heartburn, indigestion, abdominal pain, nausea, vomiting, diarrhea, change in bowel habits, loss of appetite, bloody stools.   Resp:    No chest wall deformity  Skin: no rash or lesions.  GU: no dysuria, change in color of urine, no urgency or frequency.  No flank pain, no hematuria   MS:  No joint pain or swelling.  No decreased range of motion.  No back pain.    Physical Exam  BP (!) 146/86 (BP Location: Right Arm, Cuff Size: Normal)   Pulse (!) 101   Ht 5\' 6"  (1.676 m)   Wt 196 lb (88.9 kg)   SpO2 98%   BMI 31.64 kg/m   GEN: A/Ox3; pleasant ,  NAD, obese    HEENT:  [place]/AT,  EACs-clear, TMs-wnl, NOSE-clear, THROAT-clear, no lesions, no postnasal drip or exudate noted. Tongue with thick white coating . Posterior pharynx clear   NECK:   Supple w/ fair ROM; no JVD; normal carotid impulses w/o bruits; no thyromegaly or nodules palpated; no lymphadenopathy.    RESP  Clear  P & A; w/o, wheezes/ rales/ or rhonchi. no accessory muscle use, no dullness to percussion  CARD:  RRR, no m/r/g, no peripheral edema, pulses intact, no cyanosis or clubbing.  GI:   Soft & nt; nml bowel sounds; no organomegaly or masses detected.   Musco: Warm bil, no deformities or joint swelling noted.   Neuro: alert, no focal deficits noted.    Skin: Warm, no lesions or rashes    Lab Results:  CBC    Component Value Date/Time   WBC 6.6 02/12/2017 1157   WBC 7.1 11/05/2016 1537   RBC 4.80 02/12/2017 1157   RBC 4.10 11/05/2016 1537   HGB 13.8 02/12/2017 1157   HCT 41.8 02/12/2017 1157   PLT 213 02/12/2017 1157   MCV 87.2 02/12/2017 1157   MCH 28.7 02/12/2017 1157   MCH 30.2 11/05/2016 1537   MCHC 32.9 02/12/2017 1157   MCHC 34.2 11/05/2016 1537   RDW 14.6 (H) 02/12/2017 1157   LYMPHSABS 1.9 02/12/2017 1157   MONOABS 0.4 02/12/2017 1157   EOSABS 0.1 02/12/2017 1157   BASOSABS 0.0 02/12/2017 1157    BMET    Component Value Date/Time   NA 145 02/12/2017 1157   K 3.6 02/12/2017 1157   CL 102 10/15/2016 1726   CL 103 09/10/2012 1618   CO2 31 (H) 02/12/2017 1157   GLUCOSE 106 02/12/2017 1157   GLUCOSE 86 09/10/2012 1618   BUN 11.1 02/12/2017 1157   CREATININE 1.2 (H) 02/12/2017 1157   CALCIUM 9.5 02/12/2017 1157   GFRNONAA 72 05/13/2016 1415   GFRAA 83 05/13/2016 1415    BNP No results found for: BNP  ProBNP    Component Value Date/Time   PROBNP 16.0 08/01/2014 1120      Assessment & Plan:   No problem-specific Assessment & Plan notes found for this encounter.     Rexene Edison, NP 03/07/2017

## 2017-03-07 NOTE — Patient Instructions (Signed)
Follow med calendar closely and bring to each visit.  Mycelex troches five times a day for 1 week  Brush/rinse and gargle after inhalers.  Follow up Dr. Melvyn Novas  In 4 months and As needed

## 2017-03-11 NOTE — Assessment & Plan Note (Signed)
Cont on GERD diet and rx

## 2017-03-11 NOTE — Assessment & Plan Note (Signed)
Chronic cough  Cont trigger control .

## 2017-03-11 NOTE — Assessment & Plan Note (Signed)
Improved sx control with Asmanex and Symbicort  Patient's medications were reviewed today and patient education was given. Computerized medication calendar was adjusted/completed   Plan  Patient Instructions  Follow med calendar closely and bring to each visit.  Mycelex troches five times a day for 1 week  Brush/rinse and gargle after inhalers.  Follow up Dr. Melvyn Novas  In 4 months and As needed

## 2017-03-12 ENCOUNTER — Ambulatory Visit: Payer: Medicare Other

## 2017-03-12 ENCOUNTER — Other Ambulatory Visit: Payer: Medicare Other

## 2017-03-13 NOTE — Addendum Note (Signed)
Addended by: Parke Poisson E on: 03/13/2017 03:56 PM   Modules accepted: Orders

## 2017-03-17 ENCOUNTER — Other Ambulatory Visit (HOSPITAL_BASED_OUTPATIENT_CLINIC_OR_DEPARTMENT_OTHER): Payer: Medicare Other

## 2017-03-17 ENCOUNTER — Ambulatory Visit (HOSPITAL_BASED_OUTPATIENT_CLINIC_OR_DEPARTMENT_OTHER): Payer: Medicare Other

## 2017-03-17 ENCOUNTER — Other Ambulatory Visit: Payer: Self-pay | Admitting: *Deleted

## 2017-03-17 DIAGNOSIS — Z5111 Encounter for antineoplastic chemotherapy: Secondary | ICD-10-CM | POA: Diagnosis present

## 2017-03-17 DIAGNOSIS — C50412 Malignant neoplasm of upper-outer quadrant of left female breast: Secondary | ICD-10-CM

## 2017-03-17 DIAGNOSIS — Z171 Estrogen receptor negative status [ER-]: Secondary | ICD-10-CM

## 2017-03-17 LAB — CBC WITH DIFFERENTIAL/PLATELET
BASO%: 1.1 % (ref 0.0–2.0)
BASOS ABS: 0.1 10*3/uL (ref 0.0–0.1)
EOS%: 1.3 % (ref 0.0–7.0)
Eosinophils Absolute: 0.1 10*3/uL (ref 0.0–0.5)
HCT: 41.8 % (ref 34.8–46.6)
HEMOGLOBIN: 13.8 g/dL (ref 11.6–15.9)
LYMPH%: 28.3 % (ref 14.0–49.7)
MCH: 29.1 pg (ref 25.1–34.0)
MCHC: 33.1 g/dL (ref 31.5–36.0)
MCV: 88 fL (ref 79.5–101.0)
MONO#: 0.3 10*3/uL (ref 0.1–0.9)
MONO%: 5.5 % (ref 0.0–14.0)
NEUT#: 3.6 10*3/uL (ref 1.5–6.5)
NEUT%: 63.8 % (ref 38.4–76.8)
Platelets: 191 10*3/uL (ref 145–400)
RBC: 4.75 10*6/uL (ref 3.70–5.45)
RDW: 14.5 % (ref 11.2–14.5)
WBC: 5.7 10*3/uL (ref 3.9–10.3)
lymph#: 1.6 10*3/uL (ref 0.9–3.3)

## 2017-03-17 LAB — COMPREHENSIVE METABOLIC PANEL
ALT: 24 U/L (ref 0–55)
ANION GAP: 10 meq/L (ref 3–11)
AST: 17 U/L (ref 5–34)
Albumin: 3.7 g/dL (ref 3.5–5.0)
Alkaline Phosphatase: 77 U/L (ref 40–150)
BILIRUBIN TOTAL: 0.5 mg/dL (ref 0.20–1.20)
BUN: 10.1 mg/dL (ref 7.0–26.0)
CO2: 27 meq/L (ref 22–29)
CREATININE: 0.9 mg/dL (ref 0.6–1.1)
Calcium: 9.5 mg/dL (ref 8.4–10.4)
Chloride: 106 mEq/L (ref 98–109)
EGFR: >60 mL/min/{1.73_m2} (ref >60)
Glucose: 114 mg/dl (ref 70–140)
Potassium: 3.6 mEq/L (ref 3.5–5.1)
Sodium: 142 mEq/L (ref 136–145)
TOTAL PROTEIN: 7.7 g/dL (ref 6.4–8.3)

## 2017-03-17 MED ORDER — GOSERELIN ACETATE 3.6 MG ~~LOC~~ IMPL
3.6000 mg | DRUG_IMPLANT | Freq: Once | SUBCUTANEOUS | Status: AC
  Administered 2017-03-17: 3.6 mg via SUBCUTANEOUS
  Filled 2017-03-17: qty 3.6

## 2017-03-17 MED ORDER — DEXLANSOPRAZOLE 60 MG PO CPDR: 1.0000 | DELAYED_RELEASE_CAPSULE | Freq: Every day | ORAL | 0 refills | Status: DC

## 2017-03-17 NOTE — Patient Instructions (Signed)
Goserelin injection What is this medicine? GOSERELIN (GOE se rel in) is similar to a hormone found in the body. It lowers the amount of sex hormones that the body makes. Men will have lower testosterone levels and women will have lower estrogen levels while taking this medicine. In men, this medicine is used to treat prostate cancer; the injection is either given once per month or once every 12 weeks. A once per month injection (only) is used to treat women with endometriosis, dysfunctional uterine bleeding, or advanced breast cancer. This medicine may be used for other purposes; ask your health care provider or pharmacist if you have questions. What should I tell my health care provider before I take this medicine? They need to know if you have any of these conditions (some only apply to women): -diabetes -heart disease or previous heart attack -high blood pressure -high cholesterol -kidney disease -osteoporosis or low bone density -problems passing urine -spinal cord injury -stroke -tobacco smoker -an unusual or allergic reaction to goserelin, hormone therapy, other medicines, foods, dyes, or preservatives -pregnant or trying to get pregnant -breast-feeding How should I use this medicine? This medicine is for injection under the skin. It is given by a health care professional in a hospital or clinic setting. Men receive this injection once every 4 weeks or once every 12 weeks. Women will only receive the once every 4 weeks injection. Talk to your pediatrician regarding the use of this medicine in children. Special care may be needed. Overdosage: If you think you have taken too much of this medicine contact a poison control center or emergency room at once. NOTE: This medicine is only for you. Do not share this medicine with others. What if I miss a dose? It is important not to miss your dose. Call your doctor or health care professional if you are unable to keep an appointment. What may  interact with this medicine? -female hormones like estrogen -herbal or dietary supplements like black cohosh, chasteberry, or DHEA -female hormones like testosterone -prasterone This list may not describe all possible interactions. Give your health care provider a list of all the medicines, herbs, non-prescription drugs, or dietary supplements you use. Also tell them if you smoke, drink alcohol, or use illegal drugs. Some items may interact with your medicine. What should I watch for while using this medicine? Visit your doctor or health care professional for regular checks on your progress. Your symptoms may appear to get worse during the first weeks of this therapy. Tell your doctor or healthcare professional if your symptoms do not start to get better or if they get worse after this time. Your bones may get weaker if you take this medicine for a long time. If you smoke or frequently drink alcohol you may increase your risk of bone loss. A family history of osteoporosis, chronic use of drugs for seizures (convulsions), or corticosteroids can also increase your risk of bone loss. Talk to your doctor about how to keep your bones strong. This medicine should stop regular monthly menstration in women. Tell your doctor if you continue to menstrate. Women should not become pregnant while taking this medicine or for 12 weeks after stopping this medicine. Women should inform their doctor if they wish to become pregnant or think they might be pregnant. There is a potential for serious side effects to an unborn child. Talk to your health care professional or pharmacist for more information. Do not breast-feed an infant while taking this medicine. Men should   inform their doctors if they wish to father a child. This medicine may lower sperm counts. Talk to your health care professional or pharmacist for more information. What side effects may I notice from receiving this medicine? Side effects that you should  report to your doctor or health care professional as soon as possible: -allergic reactions like skin rash, itching or hives, swelling of the face, lips, or tongue -bone pain -breathing problems -changes in vision -chest pain -feeling faint or lightheaded, falls -fever, chills -pain, swelling, warmth in the leg -pain, tingling, numbness in the hands or feet -signs and symptoms of low blood pressure like dizziness; feeling faint or lightheaded, falls; unusually weak or tired -stomach pain -swelling of the ankles, feet, hands -trouble passing urine or change in the amount of urine -unusually high or low blood pressure -unusually weak or tired Side effects that usually do not require medical attention (report to your doctor or health care professional if they continue or are bothersome): -change in sex drive or performance -changes in breast size in both males and females -changes in emotions or moods -headache -hot flashes -irritation at site where injected -loss of appetite -skin problems like acne, dry skin -vaginal dryness This list may not describe all possible side effects. Call your doctor for medical advice about side effects. You may report side effects to FDA at 1-800-FDA-1088. Where should I keep my medicine? This drug is given in a hospital or clinic and will not be stored at home. NOTE: This sheet is a summary. It may not cover all possible information. If you have questions about this medicine, talk to your doctor, pharmacist, or health care provider.    2016, Elsevier/Gold Standard. (2013-05-25 11:10:35)  

## 2017-03-18 ENCOUNTER — Ambulatory Visit (INDEPENDENT_AMBULATORY_CARE_PROVIDER_SITE_OTHER): Payer: Medicare Other | Admitting: Cardiology

## 2017-03-18 ENCOUNTER — Encounter: Payer: Self-pay | Admitting: Cardiology

## 2017-03-18 ENCOUNTER — Telehealth: Payer: Self-pay | Admitting: Cardiology

## 2017-03-18 VITALS — BP 172/102 | HR 98 | Ht 66.0 in | Wt 202.0 lb

## 2017-03-18 DIAGNOSIS — I119 Hypertensive heart disease without heart failure: Secondary | ICD-10-CM

## 2017-03-18 DIAGNOSIS — R072 Precordial pain: Secondary | ICD-10-CM

## 2017-03-18 DIAGNOSIS — I5032 Chronic diastolic (congestive) heart failure: Secondary | ICD-10-CM

## 2017-03-18 DIAGNOSIS — R000 Tachycardia, unspecified: Secondary | ICD-10-CM

## 2017-03-18 DIAGNOSIS — R079 Chest pain, unspecified: Secondary | ICD-10-CM | POA: Diagnosis not present

## 2017-03-18 MED ORDER — HYDRALAZINE HCL 100 MG PO TABS: 100.0000 mg | ORAL_TABLET | Freq: Three times a day (TID) | ORAL | 1 refills | Status: DC

## 2017-03-18 MED ORDER — METOPROLOL TARTRATE 50 MG PO TABS
50.0000 mg | ORAL_TABLET | Freq: Once | ORAL | 0 refills | Status: DC
Start: 2017-03-18 — End: 2017-04-17

## 2017-03-18 MED ORDER — FUROSEMIDE 40 MG PO TABS: 40.0000 mg | ORAL_TABLET | Freq: Every day | ORAL | 2 refills | Status: DC

## 2017-03-18 NOTE — Patient Instructions (Signed)
Medication Instructions:   INCREASE YOUR HYDRALAZINE 100 MG THREE TIMES DAILY  INCREASE YOUR LASIX TO 40 MG ONCE DAILY    Testing/Procedures:  Your physician has requested that you have a renal artery duplex. During this test, an ultrasound is used to evaluate blood flow to the kidneys. Allow one hour for this exam. Do not eat after midnight the day before and avoid carbonated beverages. Take your medications as you usually do.    CORNARY CT WITH FFR Please arrive at the [address] main entrance of [facility] at xx:xx AM (30-45 minutes prior to test start time)  [facility] [address] [place], [place] [address] [phone]  Proceed to the [facility] Radiology Department (First Floor).  Please follow these instructions carefully (unless otherwise directed):    On the Night Before the Test: . Drink plenty of water. . Do not consume any caffeinated/decaffeinated beverages or chocolate 12 hours prior to your test. . Do not take any antihistamines 12 hours prior to your test.     On the Day of the Test: . Drink plenty of water. Do not drink any water within one hour of the test. . Do not eat any food 4 hours prior to the test. . You may take your regular medications prior to the test. . IF NOT ON A BETA BLOCKER - Take 50 mg of lopressor (metoprolol) one hour before the test. . HOLD Furosemide morning of the test.  After the Test: . Drink plenty of water. . After receiving IV contrast, you may experience a mild flushed feeling. This is normal. . On occasion, you may experience a mild rash up to 24 hours after the test. This is not dangerous. If this occurs, you can take Benadryl 25 mg and increase your fluid intake. . If you experience trouble breathing, this can be serious. If it is severe call 911 IMMEDIATELY. If it is mild, please call our office.     Follow-Up:  ADD TO DR NELSON'S SCHEDULE ON 04/04/17 AT 3:20 PM SLOT--OK TO ADD     If  you need a refill on your cardiac medications before your next appointment, please call your pharmacy.

## 2017-03-18 NOTE — Telephone Encounter (Signed)
Pharmacist at [facility] stated to disregard message previously requested for clarification.  Order for one time dose of metoprolol tartrate 50 mg po one dose only, take 1 hour prior to coronary ct.  Pharmacist verbalized understanding and agrees with this plan.  Pharmacist to fill one time dose of metoprolol 50 mg.

## 2017-03-18 NOTE — Progress Notes (Addendum)
Cardiology Office Note    Date:  03/18/2017   ID:  Erica [name], DOB 01-Feb-1976, MRN [id number]  PCP:  Erica Lima, MD  Cardiologist: Dr Erica [name]  Chief complain: uncontrolled HTN, chest pain, shortness of breath orthopnea.  History of Present Illness:  Erica [name] is a 41 y.o. female with history of asthma, obesity and hypertension, diastolic CHF, HLD,OSA, and breast cancer in 2010 treated with neoadjuvant chemo with docetaxel and cyclophosphamide, and paclitaxel. She also has history of recurrent syncope. He cardio monitor showed only sinus rhythm. Was felt may be due to narcolepsy. Sleep stay showed severe sleep apnea but she is noncompliant with CPap machine.  Patient has history of pseudotumor cerebri And has idiopathic intracranial hypertension. She is having surgery at [facility] on Thursday for insertion of a ventricular peritoneal shunt.  She saw Dr. Meda [name] 01/05/16 with complaints of sharp chest pain and acute bronchitis. She was started on Z-Pak as well as Mucinex breast or cough. She is here today to make sure there is to see if there is improvement and if not consider possible pericarditis.   04/04/2016 - this is a 3 months follow-up, patient has been going through acute asthma attack and was seen by pulmonary who started her on oral steroids 2 weeks ago as well as increased dose of albuterol, she is also using Qvar and Advair. She continues to have cough productive of yellowish brownish sputum in the last 2 years with some improvement with oral steroids no significant improvement after discontinuing lisinopril. She has been recently taking antibiotics. Today is her last day of prednisone. Since she has been on more intense asthma medication her blood pressure has been uncontrolled.  She underwent VP shunt placement for intracranial hypertension at [facility] in October 2017 with residual significant headache since then. Her headache is worse when she coughs. She denies any lower  extremity edema orthopnea or paroxysmal nocturnal dyspnea and no chest pain.  03/18/2017 - patient is coming after a year, she was seen by our pharmacist in between for blood pressure management and her blood pressure continues to be poorly controlled despite 5 different medications. Her primary doctor recently refilled from dose of hydralazine instead of 100 mg by mouth 3 times a day he prescribed 25 mg by mouth 3 times a day. She has been noticing exertional shortness of breath and chest pain, also paroxysmal nocturnal dyspnea associated with chest pain. No palpitations syncope or falls she gets occasional lower extremity edema but they are only mild.  Past Medical History:  Diagnosis Date  . Asthma   . Breast cancer ([place]) 2010   a. locally advanced left breast carcinoma diagnosed in 2010 and treated with neoadjuvant chemotherapy with docetaxel and cyclophosphamide as well as paclitaxel. This was followed by radiation therapy which was completed in 2011.  Marland Kitchen Dyspnea on exertion    a. 01/2013 Lexi MV: EF 54%, no ischemia/infarct;  b. 05/2013 Echo: EF 60-65%, no rwma, Gr 2 DD;  b.   . Fatty liver   . Gastroparesis   . GERD (gastroesophageal reflux disease)   . Hypertension   . Impaired glucose tolerance 04/13/2011  . Lymphadenitis, chronic    restricted LEFT extremity  . 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 OSA (obstructive sleep apnea) 05/04/2014   AHI 31/hr now on CPAP at 12cm H2O  . Personal history of chemotherapy 2010  . Personal history of radiation therapy 2010    Past Surgical History:  Procedure Laterality  Date  . BRAIN SURGERY     Shunt  . BREAST LUMPECTOMY Left 09/2008  . CARPAL TUNNEL RELEASE Left 2011  . LYMPH NODE DISSECTION Left 2010   breast; 2 wks after breast lumpectomy  . TENDON RELEASE Right 2012   "hand"  . TUBAL LIGATION  05/11/2011   Procedure: POST PARTUM TUBAL LIGATION;  Surgeon: Emeterio Reeve, MD;  Location: [facility];  Service: Gynecology;  Laterality:  Bilateral;  Induced for HTN    Current Medications: Outpatient Medications Prior to Visit  Medication Sig Dispense Refill  . acetaminophen-codeine (TYLENOL #4) 300-60 MG tablet Take 1 tablet every 4 (four) hours as needed by mouth for pain. 30 tablet 0  . acetaZOLAMIDE (DIAMOX) 250 MG tablet Take 4 tablets (1,000 mg total) by mouth 3 (three) times daily. 1200 tablet 3  . albuterol (PROVENTIL) (2.5 MG/3ML) 0.083% nebulizer solution USE 1 VIAL VIA NEBULIZER EVERY 4 HOURS AND AS NEEDED FOR WHEEZING OR SHORTNESS OF BREATH 75 mL 1  . albuterol (VENTOLIN HFA) 108 (90 Base) MCG/ACT inhaler INHALE 2 PUFFS INTO THE LUNGS EVERY 4 HOURS AS NEEDED FOR WHEEZING OR SHORTNESS OF BREATH 18 Inhaler 1  . amitriptyline (ELAVIL) 100 MG tablet Take 100 mg by mouth at bedtime.    Marland Kitchen amLODipine (NORVASC) 10 MG tablet TAKE 1 TABLET BY MOUTH EVERY DAY 90 tablet 0  . Biotin 10000 MCG TABS Take 1 tablet by mouth daily.    . budesonide-formoterol (SYMBICORT) 160-4.5 MCG/ACT inhaler INHALE 2 PUFFS INTO LUNGS 2 TIMES DAILY AS DIRECTED    . chlorpheniramine (CHLOR-TRIMETON) 4 MG tablet 2 tabs by mouth at bedtime.  May take 1 extra every 4 hours as needed    . chlorthalidone (HYGROTON) 25 MG tablet Take 1 tablet (25 mg total) daily by mouth. 90 tablet 0  . cloNIDine (CATAPRES) 0.3 MG tablet TAKE 1 TABLET BY MOUTH TWICE A DAY 180 tablet 1  . clotrimazole (MYCELEX) 10 MG troche Take 1 tablet (10 mg total) by mouth 5 (five) times daily. 35 tablet 0  . dexlansoprazole (DEXILANT) 60 MG capsule Take 1 capsule (60 mg total) by mouth daily. 90 capsule 0  . dextromethorphan-guaiFENesin (MUCINEX DM) 30-600 MG 12hr tablet Take 1 tablet by mouth 2 (two) times daily as needed for cough.    . dicyclomine (BENTYL) 20 MG tablet TAKE 1 TABLET BY MOUTH 3 TIMES DAILY AS NEEDED FOR SPASMS 90 tablet 1  . diltiazem (MATZIM LA) 420 MG 24 hr tablet Take 420 mg by mouth daily.    . DULoxetine (CYMBALTA) 30 MG capsule Take 1 capsule (30 mg total) by  mouth daily. 90 capsule 3  . DULoxetine (CYMBALTA) 60 MG capsule Take 1 capsule (60 mg total) by mouth daily. 90 capsule 3  . EPINEPHRINE 0.3 mg/0.3 mL IJ SOAJ injection INJECT 0.3 MLS INTO THE MUSCLE ONCE AS NEEDED FOR ANAPHYLAXIS 2 Device 2  . LORazepam (ATIVAN) 0.5 MG tablet Take 0.5 mg by mouth every 8 (eight) hours.    . metoCLOPramide (REGLAN) 10 MG tablet TAKE 1 TABLET BY MOUTH 4 TIMES A DAY WITH MEALS AND AT BEDTIME 360 tablet 0  . mometasone (NASONEX) 50 MCG/ACT nasal spray Place 2 sprays 2 (two) times daily into the nose. 17 g 11  . Mometasone Furoate (ASMANEX HFA) 200 MCG/ACT AERO Inhale 2 puffs into the lungs 2 (two) times daily. 4 Inhaler 0  . montelukast (SINGULAIR) 10 MG tablet TAKE 1 TABLET BY MOUTH ONCE DAILY AT BEDTIME 30 tablet 5  .  NITROSTAT 0.4 MG SL tablet PLACE 1 TABLET UNDER TONGUE EVERY 5 MINUTES AS NEEDED FOR CHEST PAIN, MAX OF 3 TABLETS THEN CALL 911 IF PAIN PERSISTS 100 tablet 0  . ondansetron (ZOFRAN) 4 MG tablet Take 4 mg by mouth every 6 (six) hours as needed (for severe nausea).    . OXYGEN Use CPAP at bedtime    . potassium chloride SA (K-DUR,KLOR-CON) 20 MEQ tablet Take 20 mEq by mouth 2 (two) times daily.    . primidone (MYSOLINE) 50 MG tablet Take 0.5 tablets (25 mg total) by mouth at bedtime. 45 tablet 3  . promethazine (PHENERGAN) 12.5 MG tablet 1-2 every 8 hours as needed for mild nausea    . RANEXA 500 MG 12 hr tablet TAKE 1 TABLET BY MOUTH TWICE DAILY 180 tablet 1  . Respiratory Therapy Supplies (FLUTTER) DEVI Use as directed 1 each 0  . Sodium Chloride-Sodium Bicarb (AYR SALINE NASAL RINSE) 1.57 g PACK Use as needed    . spironolactone (ALDACTONE) 50 MG tablet Take 50 mg by mouth daily.    . tapentadol HCl (NUCYNTA) 75 MG tablet Take 1 tablet (75 mg total) every 6 (six) hours as needed by mouth. 120 tablet 0  . topiramate (TOPAMAX) 25 MG tablet Take 25 mg by mouth 2 (two) times daily.    . traZODone (DESYREL) 150 MG tablet TAKE 1 TABLET BY MOUTH AT  BEDTIME 90 tablet 1  . valsartan (DIOVAN) 160 MG tablet Take 160 mg by mouth daily.    . furosemide (LASIX) 20 MG tablet Take 20 mg by mouth daily as needed.    . hydrALAZINE (APRESOLINE) 25 MG tablet Take 1 tablet (25 mg total) 3 (three) times daily by mouth. 270 tablet 0   No facility-administered medications prior to visit.      Allergies:   Aspirin; Doxycycline; Kiwi extract; Penicillins; Strawberry extract; and Sulfamethoxazole-trimethoprim   Social History   Socioeconomic History  . Marital status: Single    Spouse name: None  . Number of children: 3  . Years of education: None  . Highest education level: None  Social Needs  . Financial resource strain: None  . Food insecurity - worry: None  . Food insecurity - inability: None  . Transportation needs - medical: None  . Transportation needs - non-medical: None  Occupational History    Employer: UNEMPLOYED  Tobacco Use  . Smoking status: Never Smoker  . Smokeless tobacco: Never Used  Substance and Sexual Activity  . Alcohol use: Yes    Alcohol/week: 0.0 oz    Comment: occasionally/socially  . Drug use: No  . Sexual activity: Yes    Birth control/protection: Surgical  Other Topics Concern  . None  Social History Narrative   She lives with four children.   She is currently not working (disbaility pending).        Family History:  The patient's family history includes Breast cancer in her other and other; Diabetes in her maternal grandmother; Fibroids (age of onset: 106) in her sister; Heart disease in her maternal grandmother; Hypertension in her maternal grandmother; Other (age of onset: 27) in her mother.   ROS:   Please see the history of present illness.    Review of Systems  Constitution: Positive for malaise/fatigue.  Eyes: Negative.   Cardiovascular: Positive for chest pain and dyspnea on exertion.  Respiratory: Positive for cough and shortness of breath.   Hematologic/Lymphatic: Negative.     Musculoskeletal: Negative.  Negative for joint pain.  Gastrointestinal: Negative.   Genitourinary: Negative.   Neurological: Positive for headaches.   All other systems reviewed and are negative.   PHYSICAL EXAM:   VS:  BP (!) 172/102   Pulse 98   Ht 5\' 6"  (1.676 m)   Wt 202 lb (91.6 kg)   SpO2 98%   BMI 32.60 kg/m   Physical Exam  GEN: Well nourished, well developed, in no acute distress  Neck: no JVD, carotid bruits, or masses Cardiac:RRR;distant heart sounds, no murmurs, rubs, or gallops  Respiratory:  Decreased breath sounds with scattered rhonchi and wheezing  GI: soft, nontender, nondistended, + BS Ext: without cyanosis, clubbing, or edema, Good distal pulses bilaterally MS: no deformity or atrophy  Skin: warm and dry, no rash Psych: euthymic mood, full affect  Wt Readings from Last 3 Encounters:  03/18/17 202 lb (91.6 kg)  03/07/17 196 lb (88.9 kg)  02/05/17 192 lb (87.1 kg)      Studies/Labs Reviewed:    Recent Labs: 10/15/2016: Magnesium 2.0 03/17/2017: ALT 24; BUN 10.1; Creatinine 0.9; HGB 13.8; Platelets 191; Potassium 3.6; Sodium 142   Lipid Panel    Component Value Date/Time   CHOL 195 10/15/2016 1726   TRIG 193.0 (H) 10/15/2016 1726   HDL 42.30 10/15/2016 1726   CHOLHDL 5 10/15/2016 1726   VLDL 38.6 10/15/2016 1726   LDLCALC 114 (H) 10/15/2016 1726    Additional studies/ records that were reviewed today include:   Nuclear stress test: 02/15/2013 Impression  Exercise Capacity: Lexiscan with no exercise.  BP Response: Hypertensive blood pressure response.  Clinical Symptoms: Chest pain, headache.  ECG Impression: No significant ST segment change suggestive of ischemia.  Comparison with Prior Nuclear Study: No images to compare  Overall Impression: Normal stress nuclear study.  LV Ejection Fraction: 54%. LV Wall Motion: NL LV Function; NL Wall Motion  Loralie Champagne  02/12/2013     TTE 06/17/2013   Left ventricle: The cavity size was  normal. There was mild concentric hypertrophy. Systolic function was normal. The estimated ejection fraction was in the range of 60% to 65%. Wall motion was normal; there were no regional wall motion abnormalities. Features are consistent with a pseudonormal left ventricular filling pattern, with concomitant abnormal relaxation and increased filling pressure (grade 2 diastolic dysfunction). Doppler parameters are consistent with high ventricular filling pressure. Impressions: - When compared to 2010, EF has improved.  Accessory Clinical Findings   EKG was performed today 03/18/2017 and personally reviewed, and shows normal sinus rhythm with nonspecific ST-T wave abnormalities and mild negative T waves in leads 3 and V6, however this is unchanged from prior.   ASSESSMENT:    1. Hypertensive heart disease without heart failure   2. Precordial pain   3. Chest pain on exertion   4. Chronic diastolic CHF (congestive heart failure), NYHA class 3 ([place])   5. Sinus tachycardia      PLAN:  In order of problems listed above:  1. Hypertensive heart disease with CHF - We'll increase hydralazine to 100 mg by mouth 3 times a day - We'll increase Lasix to 40 mg daily - I will order renal arterial ultrasound to rule out renal arterial hypertension.  2. Chronic diastolic CHF compensated - appears euvolemic but is experiencing paroxysmal nocturnal dyspnea increase Lasix to 40 mg daily.  3. Chest pain - schedule coronary CTA  Check CBC, CMP, TSH, hemoglobin A1c and lipids today.  Follow-up in 2 weeks for tight blood pressure control.  Medication Adjustments/Labs and  Tests Ordered: Current medicines are reviewed at length with the patient today.  Concerns regarding medicines are outlined above.  Medication changes, Labs and Tests ordered today are listed in the Patient Instructions below. Patient Instructions  Medication Instructions:   INCREASE YOUR HYDRALAZINE 100 MG THREE TIMES  DAILY  INCREASE YOUR LASIX TO 40 MG ONCE DAILY    Testing/Procedures:  Your physician has requested that you have a renal artery duplex. During this test, an ultrasound is used to evaluate blood flow to the kidneys. Allow one hour for this exam. Do not eat after midnight the day before and avoid carbonated beverages. Take your medications as you usually do.    CORNARY CT WITH FFR Please arrive at the [facility] main entrance of [facility] at xx:xx AM (30-45 minutes prior to test start time)  [facility] [address] [place], [place] [address] [phone]  Proceed to the [facility] Radiology Department (First Floor).  Please follow these instructions carefully (unless otherwise directed):    On the Night Before the Test: . Drink plenty of water. . Do not consume any caffeinated/decaffeinated beverages or chocolate 12 hours prior to your test. . Do not take any antihistamines 12 hours prior to your test.     On the Day of the Test: . Drink plenty of water. Do not drink any water within one hour of the test. . Do not eat any food 4 hours prior to the test. . You may take your regular medications prior to the test. . IF NOT ON A BETA BLOCKER - Take 50 mg of lopressor (metoprolol) one hour before the test. . HOLD Furosemide morning of the test.  After the Test: . Drink plenty of water. . After receiving IV contrast, you may experience a mild flushed feeling. This is normal. . On occasion, you may experience a mild rash up to 24 hours after the test. This is not dangerous. If this occurs, you can take Benadryl 25 mg and increase your fluid intake. . If you experience trouble breathing, this can be serious. If it is severe call 911 IMMEDIATELY. If it is mild, please call our office.     Follow-Up:  ADD TO DR Demontez Novack'S SCHEDULE ON 04/04/17 AT 3:20 PM SLOT--OK TO ADD     If you need a refill on your cardiac medications before your next  appointment, please call your pharmacy.      Signed, Ena Dawley, MD  03/18/2017 11:22 AM    [facility] [place], [place],   [address] Phone: [phone]; Fax: [phone]

## 2017-03-18 NOTE — Telephone Encounter (Signed)
Pt's pharmacy is requesting a refill on metoprolol 50 mg tablet. This Rx states a time dose. Would you like to refill this medication, if not can it please be remove from the med list. Thanks

## 2017-03-19 ENCOUNTER — Other Ambulatory Visit: Payer: Self-pay | Admitting: Internal Medicine

## 2017-03-19 ENCOUNTER — Other Ambulatory Visit: Payer: Self-pay | Admitting: Cardiology

## 2017-03-27 ENCOUNTER — Ambulatory Visit (HOSPITAL_COMMUNITY)
Admission: RE | Admit: 2017-03-27 | Discharge: 2017-03-27 | Disposition: A | Discharge status: Home / Self Care | Payer: Medicare Other | Source: Ambulatory Visit | Attending: Cardiology | Admitting: Cardiology

## 2017-03-27 DIAGNOSIS — I119 Hypertensive heart disease without heart failure: Secondary | ICD-10-CM

## 2017-03-27 DIAGNOSIS — I701 Atherosclerosis of renal artery: Secondary | ICD-10-CM | POA: Insufficient documentation

## 2017-04-04 ENCOUNTER — Encounter: Payer: Self-pay | Admitting: Cardiology

## 2017-04-04 ENCOUNTER — Ambulatory Visit (INDEPENDENT_AMBULATORY_CARE_PROVIDER_SITE_OTHER): Payer: Medicare Other | Admitting: Cardiology

## 2017-04-04 VITALS — BP 107/96 | HR 103 | Ht 66.0 in | Wt 200.0 lb

## 2017-04-04 DIAGNOSIS — I5032 Chronic diastolic (congestive) heart failure: Secondary | ICD-10-CM | POA: Diagnosis not present

## 2017-04-04 DIAGNOSIS — R0609 Other forms of dyspnea: Secondary | ICD-10-CM | POA: Diagnosis not present

## 2017-04-04 DIAGNOSIS — R072 Precordial pain: Secondary | ICD-10-CM | POA: Diagnosis not present

## 2017-04-04 DIAGNOSIS — R000 Tachycardia, unspecified: Secondary | ICD-10-CM | POA: Diagnosis not present

## 2017-04-04 DIAGNOSIS — I10 Essential (primary) hypertension: Secondary | ICD-10-CM

## 2017-04-04 DIAGNOSIS — I119 Hypertensive heart disease without heart failure: Secondary | ICD-10-CM

## 2017-04-04 DIAGNOSIS — R0600 Dyspnea, unspecified: Secondary | ICD-10-CM

## 2017-04-04 MED ORDER — FUROSEMIDE 40 MG PO TABS: 40.0000 mg | ORAL_TABLET | Freq: Two times a day (BID) | ORAL | 1 refills | Status: DC

## 2017-04-04 MED ORDER — SPIRONOLACTONE 100 MG PO TABS: 100.0000 mg | ORAL_TABLET | Freq: Every day | ORAL | 1 refills | Status: DC

## 2017-04-04 MED ORDER — CARVEDILOL 6.25 MG PO TABS: 6.2500 mg | ORAL_TABLET | Freq: Two times a day (BID) | ORAL | 1 refills | Status: DC

## 2017-04-04 MED ORDER — LISINOPRIL 10 MG PO TABS: 10.0000 mg | ORAL_TABLET | Freq: Every day | ORAL | 3 refills | Status: DC

## 2017-04-04 NOTE — Patient Instructions (Signed)
Medication Instructions:   STOP TAKING VALSARTAN  START TAKING LISINOPRIL 10 MG ONCE DAILY  START TAKING CARVEDILOL 6.25 MG TWICE DAILY  INCREASE YOUR SPIRONOLACTONE TO 100 MG ONCE DAILY  INCREASE YOUR LASIX TO 40 MG TWICE DAILY    Labwork:  TODAY--BMET AND PRO-BNP     Follow-Up:  2 WEEKS WITH [facility]       If you need a refill on your cardiac medications before your next appointment, please call your pharmacy.

## 2017-04-04 NOTE — Progress Notes (Signed)
Cardiology Office Note    Date:  04/04/2017   ID:  Erica [name], DOB July 01, 1975, MRN [id number]  PCP:  Erica Lima, MD  Cardiologist: Dr Erica [name]  Chief complain: uncontrolled HTN, chest pain, shortness of breath orthopnea.  History of Present Illness:  Erica [name] is a 42 y.o. female with history of asthma, obesity and hypertension, diastolic CHF, HLD,OSA, and breast cancer in 2010 treated with neoadjuvant chemo with docetaxel and cyclophosphamide, and paclitaxel. She also has history of recurrent syncope. He cardio monitor showed only sinus rhythm. Was felt may be due to narcolepsy. Sleep stay showed severe sleep apnea but she is noncompliant with CPap machine.  Patient has history of pseudotumor cerebri And has idiopathic intracranial hypertension. She is having surgery at [facility] on Thursday for insertion of a ventricular peritoneal shunt.  She saw Dr. Meda [name] 01/05/16 with complaints of sharp chest pain and acute bronchitis. She was started on Z-Pak as well as Mucinex breast or cough. She is here today to make sure there is to see if there is improvement and if not consider possible pericarditis.   04/04/2016 - this is a 3 months follow-up, patient has been going through acute asthma attack and was seen by pulmonary who started her on oral steroids 2 weeks ago as well as increased dose of albuterol, she is also using Qvar and Advair. She continues to have cough productive of yellowish brownish sputum in the last 2 years with some improvement with oral steroids no significant improvement after discontinuing lisinopril. She has been recently taking antibiotics. Today is her last day of prednisone. Since she has been on more intense asthma medication her blood pressure has been uncontrolled.  She underwent VP shunt placement for intracranial hypertension at [facility] in October 2017 with residual significant headache since then. Her headache is worse when she coughs. She denies any lower  extremity edema orthopnea or paroxysmal nocturnal dyspnea and no chest pain.  03/18/2017 - patient is coming after a year, she was seen by our pharmacist in between for blood pressure management and her blood pressure continues to be poorly controlled despite 5 different medications. Her primary doctor recently refilled from dose of hydralazine instead of 100 mg by mouth 3 times a day he prescribed 25 mg by mouth 3 times a day. She has been noticing exertional shortness of breath and chest pain, also paroxysmal nocturnal dyspnea associated with chest pain. No palpitations syncope or falls she gets occasional lower extremity edema but they are only mild.  04/04/2017 - this is 2 week follow-up, the patient states that she continues feeling the same, she gets short of breath on minimal exertion such as walking to the mailbox or walking from the parking lot here. She has been compliant with her medications. She is difficulty breathing at night. She has a sleep apnea machine, however she can't tolerate it and only use it for part of the night.   Past Medical History:  Diagnosis Date  . Asthma   . Breast cancer ([place]) 2010   a. locally advanced left breast carcinoma diagnosed in 2010 and treated with neoadjuvant chemotherapy with docetaxel and cyclophosphamide as well as paclitaxel. This was followed by radiation therapy which was completed in 2011.  Marland Kitchen Dyspnea on exertion    a. 01/2013 Lexi MV: EF 54%, no ischemia/infarct;  b. 05/2013 Echo: EF 60-65%, no rwma, Gr 2 DD;  b.   . Fatty liver   . Gastroparesis   . GERD (gastroesophageal  reflux disease)   . Hypertension   . Impaired glucose tolerance 04/13/2011  . Lymphadenitis, chronic    restricted LEFT extremity  . 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 OSA (obstructive sleep apnea) 05/04/2014   AHI 31/hr now on CPAP at 12cm H2O  . Personal history of chemotherapy 2010  . Personal history of radiation therapy 2010    Past Surgical History:    Procedure Laterality Date  . BRAIN SURGERY     Shunt  . BREAST LUMPECTOMY Left 09/2008  . CARPAL TUNNEL RELEASE Left 2011  . LYMPH NODE DISSECTION Left 2010   breast; 2 wks after breast lumpectomy  . TENDON RELEASE Right 2012   "hand"  . TUBAL LIGATION  05/11/2011   Procedure: POST PARTUM TUBAL LIGATION;  Surgeon: Emeterio Reeve, MD;  Location: [facility];  Service: Gynecology;  Laterality: Bilateral;  Induced for HTN    Current Medications: Outpatient Medications Prior to Visit  Medication Sig Dispense Refill  . acetaminophen-codeine (TYLENOL #4) 300-60 MG tablet Take 1 tablet every 4 (four) hours as needed by mouth for pain. 30 tablet 0  . acetaZOLAMIDE (DIAMOX) 250 MG tablet Take 4 tablets (1,000 mg total) by mouth 3 (three) times daily. 1200 tablet 3  . albuterol (PROVENTIL) (2.5 MG/3ML) 0.083% nebulizer solution USE 1 VIAL VIA NEBULIZER EVERY 4 HOURS AND AS NEEDED FOR WHEEZING OR SHORTNESS OF BREATH 75 mL 1  . albuterol (VENTOLIN HFA) 108 (90 Base) MCG/ACT inhaler INHALE 2 PUFFS INTO THE LUNGS EVERY 4 HOURS AS NEEDED FOR WHEEZING OR SHORTNESS OF BREATH 18 Inhaler 1  . amitriptyline (ELAVIL) 100 MG tablet Take 100 mg by mouth at bedtime.    Marland Kitchen amLODipine (NORVASC) 10 MG tablet TAKE 1 TABLET BY MOUTH EVERY DAY 90 tablet 0  . Biotin 10000 MCG TABS Take 1 tablet by mouth daily.    . budesonide-formoterol (SYMBICORT) 160-4.5 MCG/ACT inhaler INHALE 2 PUFFS INTO LUNGS 2 TIMES DAILY AS DIRECTED    . chlorpheniramine (CHLOR-TRIMETON) 4 MG tablet 2 tabs by mouth at bedtime.  May take 1 extra every 4 hours as needed    . chlorthalidone (HYGROTON) 25 MG tablet Take 1 tablet (25 mg total) daily by mouth. 90 tablet 0  . cloNIDine (CATAPRES) 0.3 MG tablet TAKE 1 TABLET BY MOUTH TWICE A DAY 180 tablet 3  . clotrimazole (MYCELEX) 10 MG troche Take 1 tablet (10 mg total) by mouth 5 (five) times daily. 35 tablet 0  . dexlansoprazole (DEXILANT) 60 MG capsule Take 1 capsule (60 mg total) by mouth daily. 90  capsule 0  . dextromethorphan-guaiFENesin (MUCINEX DM) 30-600 MG 12hr tablet Take 1 tablet by mouth 2 (two) times daily as needed for cough.    . dicyclomine (BENTYL) 20 MG tablet TAKE 1 TABLET BY MOUTH 3 TIMES DAILY AS NEEDED FOR SPASMS 90 tablet 1  . diltiazem (MATZIM LA) 420 MG 24 hr tablet Take 420 mg by mouth daily.    . DULoxetine (CYMBALTA) 30 MG capsule Take 1 capsule (30 mg total) by mouth daily. 90 capsule 3  . DULoxetine (CYMBALTA) 60 MG capsule Take 1 capsule (60 mg total) by mouth daily. 90 capsule 3  . EPINEPHRINE 0.3 mg/0.3 mL IJ SOAJ injection INJECT 0.3 MLS INTO THE MUSCLE ONCE AS NEEDED FOR ANAPHYLAXIS 2 Device 2  . hydrALAZINE (APRESOLINE) 100 MG tablet Take 1 tablet (100 mg total) by mouth 3 (three) times daily. 270 tablet 1  . LORazepam (ATIVAN) 0.5 MG tablet Take 0.5 mg  by mouth every 8 (eight) hours.    . metoCLOPramide (REGLAN) 10 MG tablet TAKE 1 TABLET BY MOUTH 4 TIMES A DAY WITH MEALS AND AT BEDTIME 360 tablet 0  . mometasone (NASONEX) 50 MCG/ACT nasal spray Place 2 sprays 2 (two) times daily into the nose. 17 g 11  . Mometasone Furoate (ASMANEX HFA) 200 MCG/ACT AERO Inhale 2 puffs into the lungs 2 (two) times daily. 4 Inhaler 0  . montelukast (SINGULAIR) 10 MG tablet TAKE 1 TABLET BY MOUTH ONCE DAILY AT BEDTIME 30 tablet 5  . NITROSTAT 0.4 MG SL tablet PLACE 1 TABLET UNDER TONGUE EVERY 5 MINUTES AS NEEDED FOR CHEST PAIN, MAX OF 3 TABLETS THEN CALL 911 IF PAIN PERSISTS 100 tablet 0  . ondansetron (ZOFRAN) 4 MG tablet Take 4 mg by mouth every 6 (six) hours as needed (for severe nausea).    . OXYGEN Use CPAP at bedtime    . potassium chloride SA (K-DUR,KLOR-CON) 20 MEQ tablet Take 20 mEq by mouth 2 (two) times daily.    . primidone (MYSOLINE) 50 MG tablet Take 0.5 tablets (25 mg total) by mouth at bedtime. 45 tablet 3  . promethazine (PHENERGAN) 12.5 MG tablet 1-2 every 8 hours as needed for mild nausea    . RANEXA 500 MG 12 hr tablet TAKE 1 TABLET BY MOUTH TWICE DAILY 180  tablet 1  . Respiratory Therapy Supplies (FLUTTER) DEVI Use as directed 1 each 0  . Sodium Chloride-Sodium Bicarb (AYR SALINE NASAL RINSE) 1.57 g PACK Use as needed    . tapentadol HCl (NUCYNTA) 75 MG tablet Take 1 tablet (75 mg total) every 6 (six) hours as needed by mouth. 120 tablet 0  . topiramate (TOPAMAX) 25 MG tablet Take 25 mg by mouth 2 (two) times daily.    . traZODone (DESYREL) 150 MG tablet TAKE 1 TABLET BY MOUTH AT BEDTIME 90 tablet 1  . furosemide (LASIX) 40 MG tablet Take 1 tablet (40 mg total) by mouth daily. 90 tablet 2  . spironolactone (ALDACTONE) 50 MG tablet Take 50 mg by mouth daily.    . valsartan (DIOVAN) 160 MG tablet Take 160 mg by mouth daily.    Marland Kitchen lidocaine-prilocaine (EMLA) cream Apply 1 application topically. Apply 1 application to skin ever 28 days    . metoprolol tartrate (LOPRESSOR) 50 MG tablet Take 1 tablet (50 mg total) by mouth once for 1 dose. Take this 1 hour prior to Coronary CT. 1 tablet 0   No facility-administered medications prior to visit.      Allergies:   Aspirin; Doxycycline; Kiwi extract; Penicillins; Strawberry extract; and Sulfamethoxazole-trimethoprim   Social History   Socioeconomic History  . Marital status: Single    Spouse name: None  . Number of children: 3  . Years of education: None  . Highest education level: None  Social Needs  . Financial resource strain: None  . Food insecurity - worry: None  . Food insecurity - inability: None  . Transportation needs - medical: None  . Transportation needs - non-medical: None  Occupational History    Employer: UNEMPLOYED  Tobacco Use  . Smoking status: Never Smoker  . Smokeless tobacco: Never Used  Substance and Sexual Activity  . Alcohol use: Yes    Alcohol/week: 0.0 oz    Comment: occasionally/socially  . Drug use: No  . Sexual activity: Yes    Birth control/protection: Surgical  Other Topics Concern  . None  Social History Narrative   She lives with  four children.   She  is currently not working (disbaility pending).        Family History:  The patient's family history includes Breast cancer in her other and other; Diabetes in her maternal grandmother; Fibroids (age of onset: 43) in her sister; Heart disease in her maternal grandmother; Hypertension in her maternal grandmother; Other (age of onset: 11) in her mother.   ROS:   Please see the history of present illness.    Review of Systems  Constitution: Positive for malaise/fatigue.  Eyes: Negative.   Cardiovascular: Positive for chest pain and dyspnea on exertion.  Respiratory: Positive for cough and shortness of breath.   Hematologic/Lymphatic: Negative.   Musculoskeletal: Negative.  Negative for joint pain.  Gastrointestinal: Negative.   Genitourinary: Negative.   Neurological: Positive for headaches.   All other systems reviewed and are negative.   PHYSICAL EXAM:   VS:  BP (!) 107/96   Pulse (!) 103   Ht 5\' 6"  (1.676 m)   Wt 200 lb (90.7 kg)   SpO2 97%   BMI 32.28 kg/m   Physical Exam  GEN: Well nourished, well developed, in no acute distress  Neck: no JVD, carotid bruits, or masses Cardiac:RRR;distant heart sounds, no murmurs, rubs, or gallops  Respiratory:  Decreased breath sounds with scattered rhonchi and wheezing  GI: soft, nontender, nondistended, + BS Ext: without cyanosis, clubbing, or edema, Good distal pulses bilaterally MS: no deformity or atrophy  Skin: warm and dry, no rash Psych: euthymic mood, full affect  Wt Readings from Last 3 Encounters:  04/04/17 200 lb (90.7 kg)  03/18/17 202 lb (91.6 kg)  03/07/17 196 lb (88.9 kg)      Studies/Labs Reviewed:    Recent Labs: 10/15/2016: Magnesium 2.0 03/17/2017: ALT 24; BUN 10.1; Creatinine 0.9; HGB 13.8; Platelets 191; Potassium 3.6; Sodium 142   Lipid Panel    Component Value Date/Time   CHOL 195 10/15/2016 1726   TRIG 193.0 (H) 10/15/2016 1726   HDL 42.30 10/15/2016 1726   CHOLHDL 5 10/15/2016 1726   VLDL 38.6  10/15/2016 1726   LDLCALC 114 (H) 10/15/2016 1726    Additional studies/ records that were reviewed today include:   Nuclear stress test: 02/15/2013 Impression  Exercise Capacity: Lexiscan with no exercise.  BP Response: Hypertensive blood pressure response.  Clinical Symptoms: Chest pain, headache.  ECG Impression: No significant ST segment change suggestive of ischemia.  Comparison with Prior Nuclear Study: No images to compare  Overall Impression: Normal stress nuclear study.  LV Ejection Fraction: 54%. LV Wall Motion: NL LV Function; NL Wall Motion  Loralie Champagne  02/12/2013     TTE 06/17/2013   Left ventricle: The cavity size was normal. There was mild concentric hypertrophy. Systolic function was normal. The estimated ejection fraction was in the range of 60% to 65%. Wall motion was normal; there were no regional wall motion abnormalities. Features are consistent with a pseudonormal left ventricular filling pattern, with concomitant abnormal relaxation and increased filling pressure (grade 2 diastolic dysfunction). Doppler parameters are consistent with high ventricular filling pressure. Impressions: - When compared to 2010, EF has improved.   TTE: 09/18/15  - Left ventricle: The cavity size was normal. Wall thickness was   increased in a pattern of severe LVH. Systolic function was   vigorous. The estimated ejection fraction was in the range of 65%   to 70%. Doppler parameters are consistent with abnormal left   ventricular relaxation (grade 1 diastolic dysfunction).   Accessory  Clinical Findings   EKG was performed today 03/18/2017 and personally reviewed, and shows normal sinus rhythm with nonspecific ST-T wave abnormalities and mild negative T waves in leads 3 and V6, however this is unchanged from prior.   ASSESSMENT:    1. Hypertensive heart disease without heart failure   2. Precordial pain   3. Chronic diastolic CHF (congestive heart failure), NYHA  class 3 ([place])   4. Sinus tachycardia   5. Resistant hypertension   6. DOE (dyspnea on exertion)      PLAN:  In order of problems listed above:  1. Hypertensive heart disease with CHF, severe resistant hypertension - Renal arterial ultrasound didn't show any significant stenosis, normal renal parenchyma - We'll increase spironolactone to 100 mg daily - We'll increase Lasix to 40 mg twice daily - Add carvedilol 6.25 twice daily - Switch valsartan to lisinopril 10 mg daily  Follow-up with the pharmacist in 2 weeks, check BMP and BNP at that time.  2. Acute on Chronic diastolic CHF the patient doesn't appear to be fluid overloaded however complaints of 4 pillow orthopnea and occipital nocturnal dyspnea, I will increase Lasix to 40 mg by mouth twice a day and [place] trunk to 100 mg daily.  3. Chest pain - schedule coronary CTA  Follow-up in 2 weeks for tight blood pressure control.  Medication Adjustments/Labs and Tests Ordered: Current medicines are reviewed at length with the patient today.  Concerns regarding medicines are outlined above.  Medication changes, Labs and Tests ordered today are listed in the Patient Instructions below. Patient Instructions  Medication Instructions:   STOP TAKING VALSARTAN  START TAKING LISINOPRIL 10 MG ONCE DAILY  START TAKING CARVEDILOL 6.25 MG TWICE DAILY  INCREASE YOUR SPIRONOLACTONE TO 100 MG ONCE DAILY  INCREASE YOUR LASIX TO 40 MG TWICE DAILY    Labwork:  TODAY--BMET AND PRO-BNP     Follow-Up:  2 WEEKS WITH [facility]       If you need a refill on your cardiac medications before your next appointment, please call your pharmacy.      Signed, Ena Dawley, MD  04/04/2017 4:52 PM    [facility] [place], [place], [place]  [address] Phone: [phone]; Fax: [phone]

## 2017-04-05 ENCOUNTER — Other Ambulatory Visit: Payer: Self-pay | Admitting: Oncology

## 2017-04-05 LAB — BASIC METABOLIC PANEL
BUN/Creatinine Ratio: 10 (ref 9–23)
BUN: 10 mg/dL (ref 6–24)
CO2: 24 mmol/L (ref 20–29)
Calcium: 9.5 mg/dL (ref 8.7–10.2)
Chloride: 104 mmol/L (ref 96–106)
Creatinine, Ser: 1.03 mg/dL — ABNORMAL HIGH (ref 0.57–1.00)
GFR calc Af Amer: 77 mL/min/{1.73_m2} (ref >59)
GFR calc non Af Amer: 67 mL/min/{1.73_m2} (ref >59)
Glucose: 89 mg/dL (ref 65–99)
Potassium: 3.5 mmol/L (ref 3.5–5.2)
Sodium: 146 mmol/L — ABNORMAL HIGH (ref 134–144)

## 2017-04-05 LAB — PRO B NATRIURETIC PEPTIDE: NT-Pro BNP: 290 pg/mL — ABNORMAL HIGH (ref 0–130)

## 2017-04-07 ENCOUNTER — Encounter: Payer: Self-pay | Admitting: Internal Medicine

## 2017-04-07 ENCOUNTER — Ambulatory Visit (INDEPENDENT_AMBULATORY_CARE_PROVIDER_SITE_OTHER): Payer: Medicare Other | Admitting: Internal Medicine

## 2017-04-07 ENCOUNTER — Other Ambulatory Visit: Payer: Self-pay | Admitting: *Deleted

## 2017-04-07 VITALS — BP 164/78 | HR 105 | Ht 66.0 in | Wt 194.0 lb

## 2017-04-07 DIAGNOSIS — K3184 Gastroparesis: Secondary | ICD-10-CM | POA: Diagnosis not present

## 2017-04-07 DIAGNOSIS — K219 Gastro-esophageal reflux disease without esophagitis: Secondary | ICD-10-CM | POA: Diagnosis not present

## 2017-04-07 MED ORDER — METOCLOPRAMIDE HCL 10 MG PO TABS: ORAL_TABLET | ORAL | 3 refills | Status: DC

## 2017-04-07 MED ORDER — RANITIDINE HCL 150 MG PO TABS: 150.0000 mg | ORAL_TABLET | Freq: Two times a day (BID) | ORAL | 3 refills | Status: DC

## 2017-04-07 MED ORDER — DEXLANSOPRAZOLE 60 MG PO CPDR: 1.0000 | DELAYED_RELEASE_CAPSULE | Freq: Every day | ORAL | 3 refills | Status: DC

## 2017-04-07 MED ORDER — PROMETHAZINE HCL 25 MG PO TABS: 25.0000 mg | ORAL_TABLET | Freq: Three times a day (TID) | ORAL | 1 refills | Status: DC | PRN

## 2017-04-07 MED ORDER — AMLODIPINE BESYLATE 10 MG PO TABS: 10.0000 mg | ORAL_TABLET | Freq: Every day | ORAL | 0 refills | Status: DC

## 2017-04-07 NOTE — Patient Instructions (Signed)
We have sent the following medications to your pharmacy for you to pick up at your convenience: Ranitidine 150 mg every night.  Continue Dexilant 60 mg daily  Continue reglan 10 mg three times daily before meals and at bedtime.  Phenergan 25 mg three times daily as needed.  Please follow up with Dr Hilarie Fredrickson in 1 year.

## 2017-04-08 ENCOUNTER — Encounter: Payer: Self-pay | Admitting: Obstetrics and Gynecology

## 2017-04-08 ENCOUNTER — Ambulatory Visit (INDEPENDENT_AMBULATORY_CARE_PROVIDER_SITE_OTHER): Payer: Medicare Other | Admitting: Obstetrics and Gynecology

## 2017-04-08 VITALS — BP 193/108 | HR 91 | Ht 66.0 in | Wt 196.3 lb

## 2017-04-08 DIAGNOSIS — N938 Other specified abnormal uterine and vaginal bleeding: Secondary | ICD-10-CM

## 2017-04-08 NOTE — Progress Notes (Signed)
Pt Phq-9 and GAD-7 elevated. Pt declines to see [facility].

## 2017-04-08 NOTE — Patient Instructions (Signed)
Vaginal Hysterectomy A vaginal hysterectomy is a procedure to remove all or part of the uterus through a small incision in the vagina. In this procedure, your health care provider may remove your entire uterus, including the lower end (cervix). You may need a vaginal hysterectomy to treat:  Uterine fibroids.  A condition that causes the lining of the uterus to grow in other areas (endometriosis).  Problems with pelvic support.  Cancer of the cervix, ovaries, uterus, or tissue that lines the uterus (endometrium).  Excessive (dysfunctional) uterine bleeding.  When removing your uterus, your health care provider may also remove the organs that produce eggs (ovaries) and the tubes that carry eggs to your uterus (fallopian tubes). After a vaginal hysterectomy, you will no longer be able to have a baby. You will also no longer get your menstrual period. Tell a health care provider about:  Any allergies you have.  All medicines you are taking, including vitamins, herbs, eye drops, creams, and over-the-counter medicines.  Any problems you or family members have had with anesthetic medicines.  Any blood disorders you have.  Any surgeries you have had.  Any medical conditions you have.  Whether you are pregnant or may be pregnant. What are the risks? Generally, this is a safe procedure. However, problems may occur, including:  Bleeding.  Infection.  A blood clot that forms in your leg and travels to your lungs (pulmonary embolism).  Damage to surrounding organs.  Pain during sex.  What happens before the procedure?  Ask your health care provider what organs will be removed during surgery.  Ask your health care provider about: ? Changing or stopping your regular medicines. This is especially important if you are taking diabetes medicines or blood thinners. ? Taking medicines such as aspirin and ibuprofen. These medicines can thin your blood. Do not take these medicines before  your procedure if your health care provider instructs you not to.  Follow instructions from your health care provider about eating or drinking restrictions.  Do not use any tobacco products, such as cigarettes, chewing tobacco, and e-cigarettes. If you need help quitting, ask your health care provider.  Plan to have someone take you home after discharge from the hospital. What happens during the procedure?  To reduce your risk of infection: ? Your health care team will wash or sanitize their hands. ? Your skin will be washed with soap.  An IV tube will be inserted into one of your veins.  You may be given antibiotic medicine to help prevent infection.  You will be given one or more of the following: ? A medicine to help you relax (sedative). ? A medicine to numb the area (local anesthetic). ? A medicine to make you fall asleep (general anesthetic). ? A medicine that is injected into an area of your body to numb everything beyond the injection site (regional anesthetic).  Your surgeon will make an incision in your vagina.  Your surgeon will locate and remove all or part of your uterus.  Your ovaries and fallopian tubes may be removed at the same time.  The incision will be closed with stitches (sutures) that dissolve over time. The procedure may vary among health care providers and hospitals. What happens after the procedure?  Your blood pressure, heart rate, breathing rate, and blood oxygen level will be monitored often until the medicines you were given have worn off.  You will be encouraged to get up and walk around after a few hours to help prevent   complications.  You may have IV tubes in place for a few days.  You will be given pain medicine as needed.  Do not drive for 24 hours if you were given a sedative. This information is not intended to replace advice given to you by your health care provider. Make sure you discuss any questions you have with your health care  provider. Document Released: 07/10/2015 Document Revised: 08/24/2015 Document Reviewed: 04/02/2015 Elsevier Interactive Patient Education  2018 [employer]  

## 2017-04-08 NOTE — Progress Notes (Signed)
Ms Bartl presents for follow up of her DUB. W/U thus far has demonstrated a normal U/S except for a thicken endometrium. EMBX normal  She continues to have irregular bleeding episodes. Lasting for a few days up to 10 days  Sexual active without problems. BTL for contraception  TSVD x 4 (largest 7#)  PMH as previously noted  PE AF  BP 193/108 Lungs clear Heart RRR Abd soft + BS  A/P DUB  Discussed Tx options with pt. Pt is not a candidate for hormonal manipulation d/t uncontrolled HTN and breast cancer. Pt is interested in definite therapy however pt is not a surgerical presently d/t uncontrolled HTN.  Pt to continue to work with cardiology for BP control. Once controlled will need pre op clearance. After these items are accomplished pt will follow up with me to schedule TVH. Will also at that time discuss BSO with Oncology.

## 2017-04-08 NOTE — Progress Notes (Signed)
Subjective:    Patient ID: Erica [name], female    DOB: 11-Aug-1975, 42 y.o.   MRN: [id number]  HPI Erica [name] is a 42 year old female with a history of GERD, gastroparesis, severe hypertension, pseudotumor cerebri status post VP shunt, history of breast cancer with chemotherapy and radiation, peripheral neuropathy, obesity who is here for follow-up.  She was last seen in October 2017.  She is here alone today.  She reports recently she has had issues with severe hypertension.  She has been seeing Dr. Meda Coffee with titration of blood pressure medications.  Also since being seen here she had a ventricular shunt placed for her pseudotumor cerebri.  This has helped with the severe headaches but she still has daily headaches.  When she has severe headache she has severe nausea and vomiting.  She reports that from a GI perspective her symptoms are stable.  She has nausea associated with her gastroparesis despite Reglan 10 mg 3 times daily before meals and at bedtime.  Rarely does she vomit.  She does have to use Phenergan for nausea but has noticed needing higher doses.  She is taking 25-37.5 mg for relief.  When she uses it it is only once per day.  Zofran was ineffective and she is not using this.  No neurologic complaint.  She reports bowel movements as regular without blood or melena.  She is taking Dexilant 60 mg daily but will still occasionally feel heartburn particularly at night when she lays down.  She denies dysphagia and odynophagia.   Review of Systems As per HPI, otherwise negative  Current Medications, Allergies, Past Medical History, Past Surgical History, Family History and Social History were reviewed in [employer] record.    Objective:   Physical Exam BP (!) 164/78   Pulse (!) 105   Ht 5\' 6"  (1.676 m)   Wt 194 lb (88 kg)   LMP 04/06/2017 (Exact Date)   BMI 31.31 kg/m  Constitutional: Well-developed and well-nourished. No distress. HEENT:  Normocephalic and atraumatic. Oropharynx is clear and moist. Conjunctivae are normal.  No scleral icterus. Neck: Neck supple. Trachea midline. Cardiovascular: Normal rate, regular rhythm and intact distal pulses. No M/R/G Pulmonary/chest: Effort normal and breath sounds normal. No wheezing, rales or rhonchi. Abdominal: Soft, obese, nontender, nondistended. Bowel sounds active throughout. There are no masses palpable. No hepatosplenomegaly. Extremities: no clubbing, cyanosis, or edema Neurological: Alert and oriented to person place and time. Skin: Skin is warm and dry. Psychiatric: Normal mood and affect. Behavior is normal.  CBC    Component Value Date/Time   WBC 5.7 03/17/2017 0835   WBC 7.1 11/05/2016 1537   RBC 4.75 03/17/2017 0835   RBC 4.10 11/05/2016 1537   HGB 13.8 03/17/2017 0835   HCT 41.8 03/17/2017 0835   PLT 191 03/17/2017 0835   MCV 88.0 03/17/2017 0835   MCH 29.1 03/17/2017 0835   MCH 30.2 11/05/2016 1537   MCHC 33.1 03/17/2017 0835   MCHC 34.2 11/05/2016 1537   RDW 14.5 03/17/2017 0835   LYMPHSABS 1.6 03/17/2017 0835   MONOABS 0.3 03/17/2017 0835   EOSABS 0.1 03/17/2017 0835   BASOSABS 0.1 03/17/2017 0835   CMP     Component Value Date/Time   NA 146 (H) 04/04/2017 1637   NA 142 03/17/2017 0835   K 3.5 04/04/2017 1637   K 3.6 03/17/2017 0835   CL 104 04/04/2017 1637   CL 103 09/10/2012 1618   CO2 24 04/04/2017 1637   CO2  27 03/17/2017 0835   GLUCOSE 89 04/04/2017 1637   GLUCOSE 114 03/17/2017 0835   GLUCOSE 86 09/10/2012 1618   BUN 10 04/04/2017 1637   BUN 10.1 03/17/2017 0835   CREATININE 1.03 (H) 04/04/2017 1637   CREATININE 0.9 03/17/2017 0835   CALCIUM 9.5 04/04/2017 1637   CALCIUM 9.5 03/17/2017 0835   PROT 7.7 03/17/2017 0835   ALBUMIN 3.7 03/17/2017 0835   AST 17 03/17/2017 0835   ALT 24 03/17/2017 0835   ALKPHOS 77 03/17/2017 0835   BILITOT 0.50 03/17/2017 0835   GFRNONAA 67 04/04/2017 1637   GFRAA 77 04/04/2017 1637          Assessment & Plan:  42 year old female with a history of GERD, gastroparesis, severe hypertension, pseudotumor cerebri status post VP shunt, history of breast cancer with chemotherapy and radiation, peripheral neuropathy, obesity who is here for follow-up.  1.  Gastroparesis and GERD --symptoms are stable though she continues to have nausea requiring Phenergan use.  Zofran was ineffective.  She feels that metoclopramide is a necessary and essential medication for her.  We reviewed the potential for long-term side effects including tardive dyskinesia and after this discussion she wishes to continue therapy.  I reminded her to skip doses and take drug holidays when possible.  She can continue 10 mg 3 times daily before meals and at bedtime on an as-needed basis.  Continue gastroparesis diet.  I am going to increase her Phenergan slightly to 25 mg 3 times daily as needed as the 12.5 mg dose was not fully effective.  She denies somnolence or sleepiness with this medication.  She is aware of this possible side effect and the be careful when driving or operating heavy machinery while using this medicine.  For her reflux she will continue Dexilant 60 mg daily.  Add ranitidine 150 mg in the evening as needed for breakthrough heartburn symptoms.  2.  Hypertension --following with Dr. Meda Coffee with medication titration recently; she is on multiple antihypertensives  3.  Pseudotumor cerebri --status post VP shunt.  Following with neurology.  Annual follow-up, sooner if needed 25 minutes spent with the patient today. Greater than 50% was spent in counseling and coordination of care with the patient

## 2017-04-09 ENCOUNTER — Inpatient Hospital Stay: Payer: Medicare Other

## 2017-04-09 ENCOUNTER — Inpatient Hospital Stay: Payer: Medicare Other | Attending: Oncology

## 2017-04-09 VITALS — BP 185/95 | HR 97 | Temp 98.6°F | Resp 18

## 2017-04-09 DIAGNOSIS — Z5111 Encounter for antineoplastic chemotherapy: Secondary | ICD-10-CM | POA: Insufficient documentation

## 2017-04-09 DIAGNOSIS — C50412 Malignant neoplasm of upper-outer quadrant of left female breast: Secondary | ICD-10-CM

## 2017-04-09 DIAGNOSIS — Z171 Estrogen receptor negative status [ER-]: Secondary | ICD-10-CM

## 2017-04-09 LAB — CBC WITH DIFFERENTIAL/PLATELET
ABS GRANULOCYTE: 4 10*3/uL (ref 1.5–6.5)
BASOS ABS: 0.1 10*3/uL (ref 0.0–0.1)
BASOS PCT: 1 %
EOS ABS: 0.1 10*3/uL (ref 0.0–0.5)
Eosinophils Relative: 2 %
HCT: 40.8 % (ref 34.8–46.6)
HEMOGLOBIN: 13.3 g/dL (ref 11.6–15.9)
Lymphocytes Relative: 24 %
Lymphs Abs: 1.4 10*3/uL (ref 0.9–3.3)
MCH: 28.7 pg (ref 25.1–34.0)
MCHC: 32.7 g/dL (ref 31.5–36.0)
MCV: 87.9 fL (ref 79.5–101.0)
MONOS PCT: 5 %
Monocytes Absolute: 0.3 10*3/uL (ref 0.1–0.9)
NEUTROS ABS: 4 10*3/uL (ref 1.5–6.5)
Neutrophils Relative %: 68 %
Platelets: 208 10*3/uL (ref 145–400)
RBC: 4.64 MIL/uL (ref 3.70–5.45)
RDW: 14.6 % (ref 11.2–16.1)
WBC: 5.9 10*3/uL (ref 3.9–10.3)

## 2017-04-09 LAB — COMPREHENSIVE METABOLIC PANEL
ALBUMIN: 3.7 g/dL (ref 3.5–5.0)
ALK PHOS: 72 U/L (ref 40–150)
ALT: 26 U/L (ref 0–55)
ANION GAP: 9 (ref 3–11)
AST: 18 U/L (ref 5–34)
BILIRUBIN TOTAL: 0.4 mg/dL (ref 0.2–1.2)
BUN: 11 mg/dL (ref 7–26)
CALCIUM: 9.5 mg/dL (ref 8.4–10.4)
CO2: 27 mmol/L (ref 22–29)
CREATININE: 1 mg/dL (ref 0.60–1.10)
Chloride: 104 mmol/L (ref 98–109)
GFR calc Af Amer: >60 mL/min (ref >60)
GFR calc non Af Amer: >60 mL/min (ref >60)
GLUCOSE: 110 mg/dL (ref 70–140)
Potassium: 3.2 mmol/L — ABNORMAL LOW (ref 3.3–4.7)
SODIUM: 140 mmol/L (ref 136–145)
TOTAL PROTEIN: 7.6 g/dL (ref 6.4–8.3)

## 2017-04-09 MED ORDER — GOSERELIN ACETATE 3.6 MG ~~LOC~~ IMPL
3.6000 mg | DRUG_IMPLANT | Freq: Once | SUBCUTANEOUS | Status: AC
Start: 2017-04-09 — End: 2017-04-09
  Administered 2017-04-09: 3.6 mg via SUBCUTANEOUS
  Filled 2017-04-09: qty 3.6

## 2017-04-09 NOTE — Patient Instructions (Signed)
Goserelin injection What is this medicine? GOSERELIN (GOE se rel in) is similar to a hormone found in the body. It lowers the amount of sex hormones that the body makes. Men will have lower testosterone levels and women will have lower estrogen levels while taking this medicine. In men, this medicine is used to treat prostate cancer; the injection is either given once per month or once every 12 weeks. A once per month injection (only) is used to treat women with endometriosis, dysfunctional uterine bleeding, or advanced breast cancer. This medicine may be used for other purposes; ask your health care provider or pharmacist if you have questions. What should I tell my health care provider before I take this medicine? They need to know if you have any of these conditions (some only apply to women): -diabetes -heart disease or previous heart attack -high blood pressure -high cholesterol -kidney disease -osteoporosis or low bone density -problems passing urine -spinal cord injury -stroke -tobacco smoker -an unusual or allergic reaction to goserelin, hormone therapy, other medicines, foods, dyes, or preservatives -pregnant or trying to get pregnant -breast-feeding How should I use this medicine? This medicine is for injection under the skin. It is given by a health care professional in a hospital or clinic setting. Men receive this injection once every 4 weeks or once every 12 weeks. Women will only receive the once every 4 weeks injection. Talk to your pediatrician regarding the use of this medicine in children. Special care may be needed. Overdosage: If you think you have taken too much of this medicine contact a poison control center or emergency room at once. NOTE: This medicine is only for you. Do not share this medicine with others. What if I miss a dose? It is important not to miss your dose. Call your doctor or health care professional if you are unable to keep an appointment. What may  interact with this medicine? -female hormones like estrogen -herbal or dietary supplements like black cohosh, chasteberry, or DHEA -female hormones like testosterone -prasterone This list may not describe all possible interactions. Give your health care provider a list of all the medicines, herbs, non-prescription drugs, or dietary supplements you use. Also tell them if you smoke, drink alcohol, or use illegal drugs. Some items may interact with your medicine. What should I watch for while using this medicine? Visit your doctor or health care professional for regular checks on your progress. Your symptoms may appear to get worse during the first weeks of this therapy. Tell your doctor or healthcare professional if your symptoms do not start to get better or if they get worse after this time. Your bones may get weaker if you take this medicine for a long time. If you smoke or frequently drink alcohol you may increase your risk of bone loss. A family history of osteoporosis, chronic use of drugs for seizures (convulsions), or corticosteroids can also increase your risk of bone loss. Talk to your doctor about how to keep your bones strong. This medicine should stop regular monthly menstration in women. Tell your doctor if you continue to menstrate. Women should not become pregnant while taking this medicine or for 12 weeks after stopping this medicine. Women should inform their doctor if they wish to become pregnant or think they might be pregnant. There is a potential for serious side effects to an unborn child. Talk to your health care professional or pharmacist for more information. Do not breast-feed an infant while taking this medicine. Men should   inform their doctors if they wish to father a child. This medicine may lower sperm counts. Talk to your health care professional or pharmacist for more information. What side effects may I notice from receiving this medicine? Side effects that you should  report to your doctor or health care professional as soon as possible: -allergic reactions like skin rash, itching or hives, swelling of the face, lips, or tongue -bone pain -breathing problems -changes in vision -chest pain -feeling faint or lightheaded, falls -fever, chills -pain, swelling, warmth in the leg -pain, tingling, numbness in the hands or feet -signs and symptoms of low blood pressure like dizziness; feeling faint or lightheaded, falls; unusually weak or tired -stomach pain -swelling of the ankles, feet, hands -trouble passing urine or change in the amount of urine -unusually high or low blood pressure -unusually weak or tired Side effects that usually do not require medical attention (report to your doctor or health care professional if they continue or are bothersome): -change in sex drive or performance -changes in breast size in both males and females -changes in emotions or moods -headache -hot flashes -irritation at site where injected -loss of appetite -skin problems like acne, dry skin -vaginal dryness This list may not describe all possible side effects. Call your doctor for medical advice about side effects. You may report side effects to FDA at 1-800-FDA-1088. Where should I keep my medicine? This drug is given in a hospital or clinic and will not be stored at home. NOTE: This sheet is a summary. It may not cover all possible information. If you have questions about this medicine, talk to your doctor, pharmacist, or health care provider.    2016, Elsevier/Gold Standard. (2013-05-25 11:10:35)  

## 2017-04-17 ENCOUNTER — Encounter: Payer: Self-pay | Admitting: Pharmacist

## 2017-04-17 ENCOUNTER — Ambulatory Visit (INDEPENDENT_AMBULATORY_CARE_PROVIDER_SITE_OTHER): Payer: Medicare Other | Admitting: Pharmacist

## 2017-04-17 VITALS — BP 132/94 | HR 75

## 2017-04-17 DIAGNOSIS — I10 Essential (primary) hypertension: Secondary | ICD-10-CM

## 2017-04-17 LAB — BASIC METABOLIC PANEL
BUN/Creatinine Ratio: 13 (ref 9–23)
BUN: 12 mg/dL (ref 6–24)
CO2: 26 mmol/L (ref 20–29)
Calcium: 9.5 mg/dL (ref 8.7–10.2)
Chloride: 102 mmol/L (ref 96–106)
Creatinine, Ser: 0.89 mg/dL (ref 0.57–1.00)
GFR calc Af Amer: 92 mL/min/{1.73_m2} (ref >59)
GFR calc non Af Amer: 80 mL/min/{1.73_m2} (ref >59)
Glucose: 81 mg/dL (ref 65–99)
Potassium: 4.1 mmol/L (ref 3.5–5.2)
Sodium: 143 mmol/L (ref 134–144)

## 2017-04-17 NOTE — Progress Notes (Signed)
Patient ID: FATISHA RABALAIS                 DOB: July 15, 1975                      MRN: [id number]     HPI: Erica [name] is a 42 y.o. female patient of Dr. Meda Coffee who presents today for hypertension follow up. PMH significant for CHF, HTN, idiopathic intracranial hypertension, asthma, sleep apnea, obesity and hypertriglyceridemia. She also has history of recurrent syncope. The cardio monitor showed only sinus rhythm. 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 Sleep studyshowed severe sleep apnea but she is noncompliant with CPap machine - per patient she is considered noncompliant because she never sleeps more than 4 hours at time. 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 She has had a renal artery scan on 10/13/15 that was negative. She also had a normal TSH in March of 2017. She had repeat RAS on 03/27/17 and no significant renal artery stenosis seen.  She was previously seen in HTN clinic and pressures remained uncontrolled. She did not follow up as scheduled and returned to see Dr. Meda Coffee about 10 months later. At this most recent follow up with Dr. Meda Coffee, she was instructed to start carvedilol, increase spironolactone, and change valsartan to lisinopril due to recall.   She presents today for follow up. She endorses using albuterol frequently due to her asthma. She occasionally misses doses of the clonidine due to dry mouth but states this is very rare - maybe once a month or less. She does have a med calendar provided by pulmonologist. She denies missed doses of her other medications.   She reports she has been struggling with CPAP machine because machine takes her breath away and she can only use for 4 hours at a time. She reports she can't breath at night and feels that ths CPAP is suffocating her. She reports she sleeps on a lot of pillows and this does not really seem to help. She reports that this has been going on for some time  now.   She reports that she has been taking Goserelin monthly. This medication is associated with hypertension in up to 6% of cases. Also of note more commonly this medication is associated with vasodilation.   She has an extensive history of elevated pressures persistently with now 10 medications to control.   Current HTN meds:  Carvedilol 6.25mg  BID - started late last week  Furosemide 40mg  BID (630am and noon) Lisinopril 10mg  daily in the morning Spironolactone 100mg  daily  Diltiazem 420mg  daily in the morning Clonidine 0.3mg  BID - dries mouth Amlodipine 10mg  daily in the morning  Hydralazine 100mg  TID Acetazolamide 1000mg  TID Chlorthalidone 25mg  daily   Previously tried: Imdur - worsening headaches  BP goal: <130/90  Family History: She states that none of her close family members have known hypertension or other cardiac issues.   Social History: She denies tobacco and endorses only rare social alcohol consumption.   Diet:She eats most of her meals from home and prepares them without salt. She uses pepper and garlic to season. She does endorse 2-3 P[employer] per day and states that without these she becomes increasingly nauseated and the caffeine helps with her headaches.   Exercise:She tries to walk though she can barely make it 10 minutes without getting SOB. She states she tried P90x once with her daughters and had to use a  breathing treatment.   Home BP readings: she reports at home 150-160/90s in last few weeks.   Wt Readings from Last 3 Encounters:  04/08/17 196 lb 4.8 oz (89 kg)  04/07/17 194 lb (88 kg)  04/04/17 200 lb (90.7 kg)   BP Readings from Last 3 Encounters:  04/17/17 (!) 132/94  04/09/17 (!) 185/95  04/08/17 (!) 193/108   Pulse Readings from Last 3 Encounters:  04/17/17 75  04/09/17 97  04/08/17 91    Renal function: Estimated Creatinine Clearance: 91.6 mL/min (by C-G formula based on SCr of 0.89 mg/dL).  Past Medical History:  Diagnosis Date   . Asthma   . Breast cancer ([place]) 2010   a. locally advanced left breast carcinoma diagnosed in 2010 and treated with neoadjuvant chemotherapy with docetaxel and cyclophosphamide as well as paclitaxel. This was followed by radiation therapy which was completed in 2011.  Marland Kitchen Dyspnea on exertion    a. 01/2013 Lexi MV: EF 54%, no ischemia/infarct;  b. 05/2013 Echo: EF 60-65%, no rwma, Gr 2 DD;  b.   . Fatty liver   . Gastroparesis   . GERD (gastroesophageal reflux disease)   . Hypertension   . Impaired glucose tolerance 04/13/2011  . Lymphadenitis, chronic    restricted LEFT extremity  . 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 OSA (obstructive sleep apnea) 05/04/2014   AHI 31/hr now on CPAP at 12cm H2O  . Personal history of chemotherapy 2010  . Personal history of radiation therapy 2010    Current Outpatient Medications on File Prior to Visit  Medication Sig Dispense Refill  . acetaminophen-codeine (TYLENOL #4) 300-60 MG tablet Take 1 tablet every 4 (four) hours as needed by mouth for pain. 30 tablet 0  . acetaZOLAMIDE (DIAMOX) 250 MG tablet Take 4 tablets (1,000 mg total) by mouth 3 (three) times daily. 1200 tablet 3  . albuterol (PROVENTIL) (2.5 MG/3ML) 0.083% nebulizer solution USE 1 VIAL VIA NEBULIZER EVERY 4 HOURS AND AS NEEDED FOR WHEEZING OR SHORTNESS OF BREATH 75 mL 1  . albuterol (VENTOLIN HFA) 108 (90 Base) MCG/ACT inhaler INHALE 2 PUFFS INTO THE LUNGS EVERY 4 HOURS AS NEEDED FOR WHEEZING OR SHORTNESS OF BREATH 18 Inhaler 1  . amitriptyline (ELAVIL) 100 MG tablet Take 100 mg by mouth at bedtime.    Marland Kitchen amLODipine (NORVASC) 10 MG tablet Take 1 tablet (10 mg total) by mouth daily. 90 tablet 0  . Biotin 10000 MCG TABS Take 1 tablet by mouth daily.    . budesonide-formoterol (SYMBICORT) 160-4.5 MCG/ACT inhaler INHALE 2 PUFFS INTO LUNGS 2 TIMES DAILY AS DIRECTED    . carvedilol (COREG) 6.25 MG tablet Take 1 tablet (6.25 mg total) by mouth 2 (two) times daily. 180 tablet 1  .  chlorpheniramine (CHLOR-TRIMETON) 4 MG tablet 2 tabs by mouth at bedtime.  May take 1 extra every 4 hours as needed    . chlorthalidone (HYGROTON) 25 MG tablet Take 1 tablet (25 mg total) daily by mouth. 90 tablet 0  . cloNIDine (CATAPRES) 0.3 MG tablet TAKE 1 TABLET BY MOUTH TWICE A DAY 180 tablet 3  . dexlansoprazole (DEXILANT) 60 MG capsule Take 1 capsule (60 mg total) by mouth daily. 90 capsule 3  . dextromethorphan-guaiFENesin (MUCINEX DM) 30-600 MG 12hr tablet Take 1 tablet by mouth 2 (two) times daily.     Marland Kitchen dicyclomine (BENTYL) 20 MG tablet TAKE 1 TABLET BY MOUTH 3 TIMES DAILY AS NEEDED FOR SPASMS 90 tablet 1  . diltiazem (MATZIM LA)  420 MG 24 hr tablet Take 420 mg by mouth daily.    . DULoxetine (CYMBALTA) 30 MG capsule Take 1 capsule (30 mg total) by mouth daily. (Patient taking differently: Take 30 mg by mouth daily. With 60mg  for total of 90 mg) 90 capsule 3  . DULoxetine (CYMBALTA) 60 MG capsule Take 1 capsule (60 mg total) by mouth daily. (Patient taking differently: Take 60 mg by mouth daily. For total of 90 mg) 90 capsule 3  . furosemide (LASIX) 40 MG tablet Take 1 tablet (40 mg total) by mouth 2 (two) times daily. 180 tablet 1  . hydrALAZINE (APRESOLINE) 100 MG tablet Take 1 tablet (100 mg total) by mouth 3 (three) times daily. 270 tablet 1  . lidocaine-prilocaine (EMLA) cream Apply 1 application topically. Apply 1 application to skin ever 28 days    . lisinopril (PRINIVIL,ZESTRIL) 10 MG tablet Take 1 tablet (10 mg total) by mouth daily. 90 tablet 3  . LORazepam (ATIVAN) 0.5 MG tablet Take 0.5 mg by mouth every 8 (eight) hours.    . metoCLOPramide (REGLAN) 10 MG tablet TAKE 1 TABLET BY MOUTH 4 TIMES A DAY WITH MEALS AND AT BEDTIME 360 tablet 3  . Mometasone Furoate (ASMANEX HFA) 200 MCG/ACT AERO Inhale 2 puffs into the lungs 2 (two) times daily. 4 Inhaler 0  . montelukast (SINGULAIR) 10 MG tablet TAKE 1 TABLET BY MOUTH ONCE DAILY AT BEDTIME 30 tablet 5  . OXYGEN Use CPAP at bedtime     . oxymetazoline (MUCINEX NASAL SPRAY FULL FORCE) 0.05 % nasal spray Place 1 spray into both nostrils 2 (two) times daily.    . potassium chloride SA (K-DUR,KLOR-CON) 20 MEQ tablet Take 20 mEq by mouth 2 (two) times daily.    . primidone (MYSOLINE) 50 MG tablet Take 0.5 tablets (25 mg total) by mouth at bedtime. 45 tablet 3  . promethazine (PHENERGAN) 25 MG tablet Take 1 tablet (25 mg total) by mouth 3 (three) times daily as needed for nausea or vomiting. 270 tablet 1  . RANEXA 500 MG 12 hr tablet TAKE 1 TABLET BY MOUTH TWICE DAILY 180 tablet 1  . ranitidine (ZANTAC) 150 MG tablet Take 1 tablet (150 mg total) by mouth 2 (two) times daily. 180 tablet 3  . Respiratory Therapy Supplies (FLUTTER) DEVI Use as directed 1 each 0  . spironolactone (ALDACTONE) 100 MG tablet Take 1 tablet (100 mg total) by mouth daily. 90 tablet 1  . topiramate (TOPAMAX) 25 MG tablet Take 25 mg by mouth 2 (two) times daily.    . traZODone (DESYREL) 150 MG tablet TAKE 1 TABLET BY MOUTH AT BEDTIME 90 tablet 1  . EPINEPHRINE 0.3 mg/0.3 mL IJ SOAJ injection INJECT 0.3 MLS INTO THE MUSCLE ONCE AS NEEDED FOR ANAPHYLAXIS (Patient not taking: Reported on 04/17/2017) 2 Device 2  . NITROSTAT 0.4 MG SL tablet PLACE 1 TABLET UNDER TONGUE EVERY 5 MINUTES AS NEEDED FOR CHEST PAIN, MAX OF 3 TABLETS THEN CALL 911 IF PAIN PERSISTS (Patient not taking: Reported on 04/17/2017) 100 tablet 0  . tapentadol HCl (NUCYNTA) 75 MG tablet Take 1 tablet (75 mg total) every 6 (six) hours as needed by mouth. (Patient not taking: Reported on 04/17/2017) 120 tablet 0   No current facility-administered medications on file prior to visit.     Allergies  Allergen Reactions  . Aspirin Shortness Of Breath and Palpitations    Pt can take ibuprofen without reaction  . Doxycycline Anaphylaxis  . Kiwi Extract Itching and  Swelling    Lip swelling  . Penicillins Shortness Of Breath and Palpitations    Has patient had a PCN reaction causing immediate rash,  facial/tongue/throat swelling, SOB or lightheadedness with hypotension: Yes Has patient had a PCN reaction causing severe rash involving mucus membranes or skin necrosis: Yes Has patient had a PCN reaction that required hospitalization Yes Has patient had a PCN reaction occurring within the last 10 years: No If all of the above answers are "NO", then may proceed with Cephalosporin use.  . Strawberry Extract Hives  . Sulfamethoxazole-Trimethoprim Anaphylaxis    Facial, throat and tongue swelling with difficulty swallowing.    Blood pressure (!) 132/94, pulse 75, last menstrual period 04/06/2017, SpO2 98 %.   Assessment/Plan: Hypertension: Pressures today are above goal, but probably the best we have seen. With change to lisinopril and increase in spironolactone will repeat BMET today. No medication changes as she just started carvedilol at the end of last week and her pressures are improved. Pending her BMET results would plan to titrate lisinopril and or carvedilol. Did advise she watch for changes in asthma exacerbations due to the carvedilol being nonselective. She may also be a candidate for the ROX trial through [place] for persistently elevated pressures and we will discuss this at her next follow up in 2-3 weeks.    Thank you, Lelan Pons. Patterson Hammersmith, [facility]  04/17/2017 4:46 PM

## 2017-04-17 NOTE — Patient Instructions (Addendum)
Blood work today.   Continue all medications as prescribed.  Continue to monitor pressures and bring a log and your blood pressure cuff and machine with you to your next appt in 3-4 weeks.

## 2017-04-22 ENCOUNTER — Other Ambulatory Visit: Payer: Self-pay | Admitting: Cardiology

## 2017-04-22 ENCOUNTER — Other Ambulatory Visit: Payer: Self-pay | Admitting: Adult Health

## 2017-04-22 ENCOUNTER — Other Ambulatory Visit: Payer: Self-pay | Admitting: Internal Medicine

## 2017-04-22 DIAGNOSIS — I119 Hypertensive heart disease without heart failure: Secondary | ICD-10-CM

## 2017-04-22 DIAGNOSIS — I10 Essential (primary) hypertension: Secondary | ICD-10-CM

## 2017-04-24 ENCOUNTER — Encounter: Payer: Self-pay | Admitting: Cardiology

## 2017-04-28 ENCOUNTER — Ambulatory Visit: Payer: Medicare Other | Admitting: Neurology

## 2017-05-02 ENCOUNTER — Other Ambulatory Visit: Payer: Self-pay | Admitting: Internal Medicine

## 2017-05-02 DIAGNOSIS — I119 Hypertensive heart disease without heart failure: Secondary | ICD-10-CM

## 2017-05-02 DIAGNOSIS — I10 Essential (primary) hypertension: Secondary | ICD-10-CM

## 2017-05-05 ENCOUNTER — Ambulatory Visit: Payer: Medicare Other | Admitting: Neurology

## 2017-05-07 ENCOUNTER — Inpatient Hospital Stay: Payer: Medicare Other

## 2017-05-07 ENCOUNTER — Inpatient Hospital Stay: Payer: Medicare Other | Attending: Oncology

## 2017-05-07 VITALS — BP 180/85 | HR 100 | Temp 98.3°F | Resp 20

## 2017-05-07 DIAGNOSIS — Z171 Estrogen receptor negative status [ER-]: Secondary | ICD-10-CM

## 2017-05-07 DIAGNOSIS — Z5111 Encounter for antineoplastic chemotherapy: Secondary | ICD-10-CM | POA: Diagnosis not present

## 2017-05-07 DIAGNOSIS — C50412 Malignant neoplasm of upper-outer quadrant of left female breast: Secondary | ICD-10-CM

## 2017-05-07 LAB — CBC WITH DIFFERENTIAL/PLATELET
BASOS PCT: 1 %
Basophils Absolute: 0 10*3/uL (ref 0.0–0.1)
EOS ABS: 0.1 10*3/uL (ref 0.0–0.5)
EOS PCT: 1 %
HCT: 41.2 % (ref 34.8–46.6)
Hemoglobin: 13.5 g/dL (ref 11.6–15.9)
LYMPHS ABS: 2 10*3/uL (ref 0.9–3.3)
Lymphocytes Relative: 29 %
MCH: 28.7 pg (ref 25.1–34.0)
MCHC: 32.8 g/dL (ref 31.5–36.0)
MCV: 87.7 fL (ref 79.5–101.0)
MONO ABS: 0.3 10*3/uL (ref 0.1–0.9)
MONOS PCT: 5 %
Neutro Abs: 4.5 10*3/uL (ref 1.5–6.5)
Neutrophils Relative %: 64 %
PLATELETS: 206 10*3/uL (ref 145–400)
RBC: 4.69 MIL/uL (ref 3.70–5.45)
RDW: 14 % (ref 11.2–14.5)
WBC: 6.9 10*3/uL (ref 3.9–10.3)

## 2017-05-07 LAB — COMPREHENSIVE METABOLIC PANEL
ALK PHOS: 83 U/L (ref 40–150)
ALT: 26 U/L (ref 0–55)
AST: 19 U/L (ref 5–34)
Albumin: 3.7 g/dL (ref 3.5–5.0)
Anion gap: 11 (ref 3–11)
BUN: 10 mg/dL (ref 7–26)
CALCIUM: 9.2 mg/dL (ref 8.4–10.4)
CHLORIDE: 104 mmol/L (ref 98–109)
CO2: 30 mmol/L — AB (ref 22–29)
CREATININE: 0.91 mg/dL (ref 0.60–1.10)
GFR calc non Af Amer: >60 mL/min (ref >60)
Glucose, Bld: 105 mg/dL (ref 70–140)
Potassium: 3.2 mmol/L — ABNORMAL LOW (ref 3.5–5.1)
SODIUM: 145 mmol/L (ref 136–145)
Total Bilirubin: 0.4 mg/dL (ref 0.2–1.2)
Total Protein: 7.7 g/dL (ref 6.4–8.3)

## 2017-05-07 MED ORDER — GOSERELIN ACETATE 3.6 MG ~~LOC~~ IMPL
3.6000 mg | DRUG_IMPLANT | Freq: Once | SUBCUTANEOUS | Status: AC
  Administered 2017-05-07: 3.6 mg via SUBCUTANEOUS

## 2017-05-07 NOTE — Patient Instructions (Signed)
Goserelin injection What is this medicine? GOSERELIN (GOE se rel in) is similar to a hormone found in the body. It lowers the amount of sex hormones that the body makes. Men will have lower testosterone levels and women will have lower estrogen levels while taking this medicine. In men, this medicine is used to treat prostate cancer; the injection is either given once per month or once every 12 weeks. A once per month injection (only) is used to treat women with endometriosis, dysfunctional uterine bleeding, or advanced breast cancer. This medicine may be used for other purposes; ask your health care provider or pharmacist if you have questions. What should I tell my health care provider before I take this medicine? They need to know if you have any of these conditions (some only apply to women): -diabetes -heart disease or previous heart attack -high blood pressure -high cholesterol -kidney disease -osteoporosis or low bone density -problems passing urine -spinal cord injury -stroke -tobacco smoker -an unusual or allergic reaction to goserelin, hormone therapy, other medicines, foods, dyes, or preservatives -pregnant or trying to get pregnant -breast-feeding How should I use this medicine? This medicine is for injection under the skin. It is given by a health care professional in a hospital or clinic setting. Men receive this injection once every 4 weeks or once every 12 weeks. Women will only receive the once every 4 weeks injection. Talk to your pediatrician regarding the use of this medicine in children. Special care may be needed. Overdosage: If you think you have taken too much of this medicine contact a poison control center or emergency room at once. NOTE: This medicine is only for you. Do not share this medicine with others. What if I miss a dose? It is important not to miss your dose. Call your doctor or health care professional if you are unable to keep an appointment. What may  interact with this medicine? -female hormones like estrogen -herbal or dietary supplements like black cohosh, chasteberry, or DHEA -female hormones like testosterone -prasterone This list may not describe all possible interactions. Give your health care provider a list of all the medicines, herbs, non-prescription drugs, or dietary supplements you use. Also tell them if you smoke, drink alcohol, or use illegal drugs. Some items may interact with your medicine. What should I watch for while using this medicine? Visit your doctor or health care professional for regular checks on your progress. Your symptoms may appear to get worse during the first weeks of this therapy. Tell your doctor or healthcare professional if your symptoms do not start to get better or if they get worse after this time. Your bones may get weaker if you take this medicine for a long time. If you smoke or frequently drink alcohol you may increase your risk of bone loss. A family history of osteoporosis, chronic use of drugs for seizures (convulsions), or corticosteroids can also increase your risk of bone loss. Talk to your doctor about how to keep your bones strong. This medicine should stop regular monthly menstration in women. Tell your doctor if you continue to menstrate. Women should not become pregnant while taking this medicine or for 12 weeks after stopping this medicine. Women should inform their doctor if they wish to become pregnant or think they might be pregnant. There is a potential for serious side effects to an unborn child. Talk to your health care professional or pharmacist for more information. Do not breast-feed an infant while taking this medicine. Men should   inform their doctors if they wish to father a child. This medicine may lower sperm counts. Talk to your health care professional or pharmacist for more information. What side effects may I notice from receiving this medicine? Side effects that you should  report to your doctor or health care professional as soon as possible: -allergic reactions like skin rash, itching or hives, swelling of the face, lips, or tongue -bone pain -breathing problems -changes in vision -chest pain -feeling faint or lightheaded, falls -fever, chills -pain, swelling, warmth in the leg -pain, tingling, numbness in the hands or feet -signs and symptoms of low blood pressure like dizziness; feeling faint or lightheaded, falls; unusually weak or tired -stomach pain -swelling of the ankles, feet, hands -trouble passing urine or change in the amount of urine -unusually high or low blood pressure -unusually weak or tired Side effects that usually do not require medical attention (report to your doctor or health care professional if they continue or are bothersome): -change in sex drive or performance -changes in breast size in both males and females -changes in emotions or moods -headache -hot flashes -irritation at site where injected -loss of appetite -skin problems like acne, dry skin -vaginal dryness This list may not describe all possible side effects. Call your doctor for medical advice about side effects. You may report side effects to FDA at 1-800-FDA-1088. Where should I keep my medicine? This drug is given in a hospital or clinic and will not be stored at home. NOTE: This sheet is a summary. It may not cover all possible information. If you have questions about this medicine, talk to your doctor, pharmacist, or health care provider.    2016, Elsevier/Gold Standard. (2013-05-25 11:10:35)  

## 2017-05-08 ENCOUNTER — Ambulatory Visit: Payer: Medicare Other

## 2017-05-08 NOTE — Progress Notes (Deleted)
Patient ID: Erica [name]                 DOB: 31-Mar-1976                      MRN: [id number]     HPI: Erica [name] is a 42 y.o. female patient of Dr. Meda Coffee who presents today for hypertension follow up. PMH significant for CHF, HTN, idiopathic intracranial hypertension, asthma, sleep apnea, obesity and hypertriglyceridemia. She also has history of recurrent syncope. The cardio monitor showed only sinus rhythm. 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 Sleep studyshowed severe sleep apnea but she is noncompliant with CPap machine - per patient she is considered noncompliant because she never sleeps more than 4 hours at time. 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 She has had a renal artery scan on 10/13/15 that was negative. She also had a normal TSH in March of 2017. She had repeat RAS on 03/27/17 and no significant renal artery stenosis seen.  She was previously seen in HTN clinic and pressures remained uncontrolled. She did not follow up as scheduled and returned to see Dr. Meda Coffee about 10 months later. She has reported frequent albuterol use due to her asthma. She was also struggling with her CPAP machine and very SOB at night.  She also reported occasionally missing doses of her clonidine due to dry mouth. She reports that she has been taking Goserelin monthly. This medication is associated with hypertension in up to 6% of cases. Also of note more commonly this medication is associated with vasodilation. She has an extensive history of elevated pressures persistently with now 10 medications to control.   At her last OV in HTN her pressures were slightly improved. No medication changes were made as she had just had several changes a few days before that visit.  She presents today for follow up.    Current HTN meds:  Carvedilol 6.25mg  BID - started late last week  Furosemide 40mg  BID (630am and noon) Lisinopril 10mg  daily  in the morning Spironolactone 100mg  daily  Diltiazem 420mg  daily in the morning Clonidine 0.3mg  BID - dries mouth Amlodipine 10mg  daily in the morning  Hydralazine 100mg  TID Acetazolamide 1000mg  TID Chlorthalidone 25mg  daily   Previously tried: Imdur - worsening headaches  BP goal: <130/90  Family History: She states that none of her close family members have known hypertension or other cardiac issues.   Social History: She denies tobacco and endorses only rare social alcohol consumption.   Diet:She eats most of her meals from home and prepares them without salt. She uses pepper and garlic to season. She does endorse 2-3 Pepsis per day and states that without these she becomes increasingly nauseated and the caffeine helps with her headaches.   Exercise:She tries to walk though she can barely make it 10 minutes without getting SOB. She states she tried P90x once with her daughters and had to use a breathing treatment.   Home BP readings:    Wt Readings from Last 3 Encounters:  04/08/17 196 lb 4.8 oz (89 kg)  04/07/17 194 lb (88 kg)  04/04/17 200 lb (90.7 kg)   BP Readings from Last 3 Encounters:  05/07/17 (!) 180/85  04/17/17 (!) 132/94  04/09/17 (!) 185/95   Pulse Readings from Last 3 Encounters:  05/07/17 100  04/17/17 75  04/09/17 97    Renal function: CrCl cannot be calculated (Unknown  ideal weight.).  Past Medical History:  Diagnosis Date  . Asthma   . Breast cancer ([place]) 2010   a. locally advanced left breast carcinoma diagnosed in 2010 and treated with neoadjuvant chemotherapy with docetaxel and cyclophosphamide as well as paclitaxel. This was followed by radiation therapy which was completed in 2011.  Marland Kitchen Dyspnea on exertion    a. 01/2013 Lexi MV: EF 54%, no ischemia/infarct;  b. 05/2013 Echo: EF 60-65%, no rwma, Gr 2 DD;  b.   . Fatty liver   . Gastroparesis   . GERD (gastroesophageal reflux disease)   . Hypertension   . Impaired glucose tolerance  04/13/2011  . Lymphadenitis, chronic    restricted LEFT extremity  . 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 OSA (obstructive sleep apnea) 05/04/2014   AHI 31/hr now on CPAP at 12cm H2O  . Personal history of chemotherapy 2010  . Personal history of radiation therapy 2010    Current Outpatient Medications on File Prior to Visit  Medication Sig Dispense Refill  . acetaminophen-codeine (TYLENOL #4) 300-60 MG tablet TAKE 1 TABLET EVERY 4 HOURS AS NEEDED 30 tablet 0  . acetaZOLAMIDE (DIAMOX) 250 MG tablet Take 4 tablets (1,000 mg total) by mouth 3 (three) times daily. 1200 tablet 3  . albuterol (PROVENTIL) (2.5 MG/3ML) 0.083% nebulizer solution USE 1 VIAL VIA NEBULIZER EVERY 4 HOURS AND AS NEEDED FOR WHEEZING OR SHORTNESS OF BREATH 75 mL 1  . albuterol (VENTOLIN HFA) 108 (90 Base) MCG/ACT inhaler INHALE 2 PUFFS INTO THE LUNGS EVERY 4 HOURS AS NEEDED FOR WHEEZING OR SHORTNESS OF BREATH 18 Inhaler 1  . amitriptyline (ELAVIL) 100 MG tablet Take 100 mg by mouth at bedtime.    Marland Kitchen amLODipine (NORVASC) 10 MG tablet Take 1 tablet (10 mg total) by mouth daily. 90 tablet 0  . Biotin 10000 MCG TABS Take 1 tablet by mouth daily.    . budesonide-formoterol (SYMBICORT) 160-4.5 MCG/ACT inhaler INHALE 2 PUFFS INTO LUNGS 2 TIMES DAILY AS DIRECTED    . carvedilol (COREG) 6.25 MG tablet Take 1 tablet (6.25 mg total) by mouth 2 (two) times daily. 180 tablet 1  . chlorpheniramine (CHLOR-TRIMETON) 4 MG tablet 2 tabs by mouth at bedtime.  May take 1 extra every 4 hours as needed    . chlorthalidone (HYGROTON) 25 MG tablet TAKE 1 TABLET (25 MG TOTAL) DAILY BY MOUTH. 90 tablet 0  . cloNIDine (CATAPRES) 0.3 MG tablet TAKE 1 TABLET BY MOUTH TWICE A DAY 180 tablet 3  . dexlansoprazole (DEXILANT) 60 MG capsule Take 1 capsule (60 mg total) by mouth daily. 90 capsule 3  . dextromethorphan-guaiFENesin (MUCINEX DM) 30-600 MG 12hr tablet Take 1 tablet by mouth 2 (two) times daily.     Marland Kitchen dicyclomine (BENTYL) 20 MG tablet TAKE 1  TABLET BY MOUTH 3 TIMES DAILY AS NEEDED FOR SPASMS 90 tablet 1  . diltiazem (MATZIM LA) 420 MG 24 hr tablet Take 420 mg by mouth daily.    . DULoxetine (CYMBALTA) 30 MG capsule Take 1 capsule (30 mg total) by mouth daily. (Patient taking differently: Take 30 mg by mouth daily. With 60mg  for total of 90 mg) 90 capsule 3  . DULoxetine (CYMBALTA) 60 MG capsule Take 1 capsule (60 mg total) by mouth daily. (Patient taking differently: Take 60 mg by mouth daily. For total of 90 mg) 90 capsule 3  . EPINEPHRINE 0.3 mg/0.3 mL IJ SOAJ injection INJECT 0.3 MLS INTO THE MUSCLE ONCE AS NEEDED FOR ANAPHYLAXIS (Patient not  taking: Reported on 04/17/2017) 2 Device 2  . furosemide (LASIX) 40 MG tablet Take 1 tablet (40 mg total) by mouth 2 (two) times daily. 180 tablet 1  . hydrALAZINE (APRESOLINE) 100 MG tablet Take 1 tablet (100 mg total) by mouth 3 (three) times daily. 270 tablet 1  . hydrALAZINE (APRESOLINE) 25 MG tablet TAKE 1 TABLET BY MOUTH 3 (THREE) TIMES A DAY 270 tablet 0  . lidocaine-prilocaine (EMLA) cream Apply 1 application topically. Apply 1 application to skin ever 28 days    . lisinopril (PRINIVIL,ZESTRIL) 10 MG tablet Take 1 tablet (10 mg total) by mouth daily. 90 tablet 3  . LORazepam (ATIVAN) 0.5 MG tablet Take 0.5 mg by mouth every 8 (eight) hours.    . metoCLOPramide (REGLAN) 10 MG tablet TAKE 1 TABLET BY MOUTH 4 TIMES A DAY WITH MEALS AND AT BEDTIME 360 tablet 3  . metoprolol tartrate (LOPRESSOR) 50 MG tablet TAKE 1 TABLET (50 MG TOTAL) BY MOUTH ONCE FOR 1 DOSE. TAKE THIS 1 HOUR PRIOR TO CORONARY CT. 1 tablet 0  . Mometasone Furoate (ASMANEX HFA) 200 MCG/ACT AERO Inhale 2 puffs into the lungs 2 (two) times daily. 4 Inhaler 0  . montelukast (SINGULAIR) 10 MG tablet TAKE 1 TABLET BY MOUTH ONCE DAILY AT BEDTIME 30 tablet 5  . NITROSTAT 0.4 MG SL tablet PLACE 1 TABLET UNDER TONGUE EVERY 5 MINUTES AS NEEDED FOR CHEST PAIN, MAX OF 3 TABLETS THEN CALL 911 IF PAIN PERSISTS (Patient not taking: Reported  on 04/17/2017) 100 tablet 0  . OXYGEN Use CPAP at bedtime    . oxymetazoline (MUCINEX NASAL SPRAY FULL FORCE) 0.05 % nasal spray Place 1 spray into both nostrils 2 (two) times daily.    . potassium chloride SA (K-DUR,KLOR-CON) 20 MEQ tablet Take 20 mEq by mouth 2 (two) times daily.    . primidone (MYSOLINE) 50 MG tablet Take 0.5 tablets (25 mg total) by mouth at bedtime. 45 tablet 3  . promethazine (PHENERGAN) 25 MG tablet Take 1 tablet (25 mg total) by mouth 3 (three) times daily as needed for nausea or vomiting. 270 tablet 1  . RANEXA 500 MG 12 hr tablet TAKE 1 TABLET BY MOUTH TWICE A DAY 180 tablet 1  . ranitidine (ZANTAC) 150 MG tablet Take 1 tablet (150 mg total) by mouth 2 (two) times daily. 180 tablet 3  . Respiratory Therapy Supplies (FLUTTER) DEVI Use as directed 1 each 0  . spironolactone (ALDACTONE) 100 MG tablet Take 1 tablet (100 mg total) by mouth daily. 90 tablet 1  . SYMBICORT 160-4.5 MCG/ACT inhaler INHALE 2 PUFFS INTO LUNGS 2 TIMES DAILY AS DIRECTED 10.2 Inhaler 5  . tapentadol HCl (NUCYNTA) 75 MG tablet Take 1 tablet (75 mg total) every 6 (six) hours as needed by mouth. (Patient not taking: Reported on 04/17/2017) 120 tablet 0  . topiramate (TOPAMAX) 25 MG tablet Take 25 mg by mouth 2 (two) times daily.    . traZODone (DESYREL) 150 MG tablet TAKE 1 TABLET BY MOUTH AT BEDTIME 90 tablet 1   No current facility-administered medications on file prior to visit.     Allergies  Allergen Reactions  . Aspirin Shortness Of Breath and Palpitations    Pt can take ibuprofen without reaction  . Doxycycline Anaphylaxis  . Kiwi Extract Itching and Swelling    Lip swelling  . Penicillins Shortness Of Breath and Palpitations    Has patient had a PCN reaction causing immediate rash, facial/tongue/throat swelling, SOB or lightheadedness with  hypotension: Yes Has patient had a PCN reaction causing severe rash involving mucus membranes or skin necrosis: Yes Has patient had a PCN reaction that  required hospitalization Yes Has patient had a PCN reaction occurring within the last 10 years: No If all of the above answers are "NO", then may proceed with Cephalosporin use.  . Strawberry Extract Hives  . Sulfamethoxazole-Trimethoprim Anaphylaxis    Facial, throat and tongue swelling with difficulty swallowing.    There were no vitals taken for this visit.   Assessment/Plan: Hypertension: BP today increase carvedilol and lisinopril.    Thank you, Lelan Pons. Patterson Hammersmith, [facility]  05/08/2017 8:27 AM

## 2017-05-09 ENCOUNTER — Other Ambulatory Visit: Payer: Self-pay | Admitting: Cardiology

## 2017-05-09 NOTE — Telephone Encounter (Signed)
Pt's pharmacy is requesting a refill on Diltiazem (MATZIM LA) 420 mg tablet. Dr. Meda Coffee did not prescribe this medication. Would you like to refill this medication? Winifred Olive, LPN, is out of the office and asked that messages be sent to triage. Please advise

## 2017-05-20 ENCOUNTER — Telehealth: Payer: Self-pay | Admitting: *Deleted

## 2017-05-20 NOTE — Telephone Encounter (Signed)
CT REMOVAL  Received: Today  Message Contents  Juliette Alcide, LPN        [birth year] Karlene Einstein, We are unable to reach this patient by phone and mail. Referral removed.

## 2017-05-22 ENCOUNTER — Telehealth: Payer: Self-pay | Admitting: *Deleted

## 2017-05-22 NOTE — Telephone Encounter (Signed)
CT REMOVAL  Received: 2 days ago  Message Contents  Juliette Alcide, LPN        9/47/09 Karlene Einstein, We are unable to reach this patient by phone and mail. Referral removed.

## 2017-06-04 ENCOUNTER — Inpatient Hospital Stay: Payer: Medicare Other | Attending: Oncology

## 2017-06-04 ENCOUNTER — Inpatient Hospital Stay: Payer: Medicare Other

## 2017-06-04 ENCOUNTER — Other Ambulatory Visit: Payer: Self-pay | Admitting: Internal Medicine

## 2017-06-04 VITALS — BP 180/95 | HR 95 | Temp 97.8°F | Resp 18

## 2017-06-04 DIAGNOSIS — Z5111 Encounter for antineoplastic chemotherapy: Secondary | ICD-10-CM | POA: Diagnosis not present

## 2017-06-04 DIAGNOSIS — Z171 Estrogen receptor negative status [ER-]: Secondary | ICD-10-CM

## 2017-06-04 DIAGNOSIS — C50412 Malignant neoplasm of upper-outer quadrant of left female breast: Secondary | ICD-10-CM | POA: Diagnosis not present

## 2017-06-04 LAB — CBC WITH DIFFERENTIAL/PLATELET
Basophils Absolute: 0 10*3/uL (ref 0.0–0.1)
Basophils Relative: 0 %
EOS ABS: 0.1 10*3/uL (ref 0.0–0.5)
EOS PCT: 1 %
HCT: 42.8 % (ref 34.8–46.6)
HEMOGLOBIN: 13.8 g/dL (ref 11.6–15.9)
LYMPHS ABS: 1.9 10*3/uL (ref 0.9–3.3)
Lymphocytes Relative: 28 %
MCH: 28.8 pg (ref 25.1–34.0)
MCHC: 32.2 g/dL (ref 31.5–36.0)
MCV: 89.4 fL (ref 79.5–101.0)
MONOS PCT: 6 %
Monocytes Absolute: 0.4 10*3/uL (ref 0.1–0.9)
Neutro Abs: 4.3 10*3/uL (ref 1.5–6.5)
Neutrophils Relative %: 65 %
Platelets: 245 10*3/uL (ref 145–400)
RBC: 4.79 MIL/uL (ref 3.70–5.45)
RDW: 13.7 % (ref 11.2–14.5)
WBC: 6.7 10*3/uL (ref 3.9–10.3)

## 2017-06-04 LAB — COMPREHENSIVE METABOLIC PANEL
ALT: 27 U/L (ref 0–55)
AST: 20 U/L (ref 5–34)
Albumin: 3.7 g/dL (ref 3.5–5.0)
Alkaline Phosphatase: 72 U/L (ref 40–150)
Anion gap: 9 (ref 3–11)
BUN: 11 mg/dL (ref 7–26)
CHLORIDE: 105 mmol/L (ref 98–109)
CO2: 29 mmol/L (ref 22–29)
CREATININE: 1.34 mg/dL — AB (ref 0.60–1.10)
Calcium: 9.9 mg/dL (ref 8.4–10.4)
GFR calc Af Amer: 55 mL/min — ABNORMAL LOW (ref >60)
GFR calc non Af Amer: 48 mL/min — ABNORMAL LOW (ref >60)
GLUCOSE: 97 mg/dL (ref 70–140)
Potassium: 3.5 mmol/L (ref 3.5–5.1)
SODIUM: 143 mmol/L (ref 136–145)
Total Bilirubin: 0.3 mg/dL (ref 0.2–1.2)
Total Protein: 8.1 g/dL (ref 6.4–8.3)

## 2017-06-04 MED ORDER — GOSERELIN ACETATE 3.6 MG ~~LOC~~ IMPL
3.6000 mg | DRUG_IMPLANT | Freq: Once | SUBCUTANEOUS | Status: AC
  Administered 2017-06-04: 3.6 mg via SUBCUTANEOUS

## 2017-06-04 MED ORDER — GOSERELIN ACETATE 3.6 MG ~~LOC~~ IMPL
DRUG_IMPLANT | SUBCUTANEOUS | Status: AC
  Filled 2017-06-04: qty 3.6

## 2017-06-04 NOTE — Patient Instructions (Signed)
Goserelin injection What is this medicine? GOSERELIN (GOE se rel in) is similar to a hormone found in the body. It lowers the amount of sex hormones that the body makes. Men will have lower testosterone levels and women will have lower estrogen levels while taking this medicine. In men, this medicine is used to treat prostate cancer; the injection is either given once per month or once every 12 weeks. A once per month injection (only) is used to treat women with endometriosis, dysfunctional uterine bleeding, or advanced breast cancer. This medicine may be used for other purposes; ask your health care provider or pharmacist if you have questions. What should I tell my health care provider before I take this medicine? They need to know if you have any of these conditions (some only apply to women): -diabetes -heart disease or previous heart attack -high blood pressure -high cholesterol -kidney disease -osteoporosis or low bone density -problems passing urine -spinal cord injury -stroke -tobacco smoker -an unusual or allergic reaction to goserelin, hormone therapy, other medicines, foods, dyes, or preservatives -pregnant or trying to get pregnant -breast-feeding How should I use this medicine? This medicine is for injection under the skin. It is given by a health care professional in a hospital or clinic setting. Men receive this injection once every 4 weeks or once every 12 weeks. Women will only receive the once every 4 weeks injection. Talk to your pediatrician regarding the use of this medicine in children. Special care may be needed. Overdosage: If you think you have taken too much of this medicine contact a poison control center or emergency room at once. NOTE: This medicine is only for you. Do not share this medicine with others. What if I miss a dose? It is important not to miss your dose. Call your doctor or health care professional if you are unable to keep an appointment. What may  interact with this medicine? -female hormones like estrogen -herbal or dietary supplements like black cohosh, chasteberry, or DHEA -female hormones like testosterone -prasterone This list may not describe all possible interactions. Give your health care provider a list of all the medicines, herbs, non-prescription drugs, or dietary supplements you use. Also tell them if you smoke, drink alcohol, or use illegal drugs. Some items may interact with your medicine. What should I watch for while using this medicine? Visit your doctor or health care professional for regular checks on your progress. Your symptoms may appear to get worse during the first weeks of this therapy. Tell your doctor or healthcare professional if your symptoms do not start to get better or if they get worse after this time. Your bones may get weaker if you take this medicine for a long time. If you smoke or frequently drink alcohol you may increase your risk of bone loss. A family history of osteoporosis, chronic use of drugs for seizures (convulsions), or corticosteroids can also increase your risk of bone loss. Talk to your doctor about how to keep your bones strong. This medicine should stop regular monthly menstration in women. Tell your doctor if you continue to menstrate. Women should not become pregnant while taking this medicine or for 12 weeks after stopping this medicine. Women should inform their doctor if they wish to become pregnant or think they might be pregnant. There is a potential for serious side effects to an unborn child. Talk to your health care professional or pharmacist for more information. Do not breast-feed an infant while taking this medicine. Men should   inform their doctors if they wish to father a child. This medicine may lower sperm counts. Talk to your health care professional or pharmacist for more information. What side effects may I notice from receiving this medicine? Side effects that you should  report to your doctor or health care professional as soon as possible: -allergic reactions like skin rash, itching or hives, swelling of the face, lips, or tongue -bone pain -breathing problems -changes in vision -chest pain -feeling faint or lightheaded, falls -fever, chills -pain, swelling, warmth in the leg -pain, tingling, numbness in the hands or feet -signs and symptoms of low blood pressure like dizziness; feeling faint or lightheaded, falls; unusually weak or tired -stomach pain -swelling of the ankles, feet, hands -trouble passing urine or change in the amount of urine -unusually high or low blood pressure -unusually weak or tired Side effects that usually do not require medical attention (report to your doctor or health care professional if they continue or are bothersome): -change in sex drive or performance -changes in breast size in both males and females -changes in emotions or moods -headache -hot flashes -irritation at site where injected -loss of appetite -skin problems like acne, dry skin -vaginal dryness This list may not describe all possible side effects. Call your doctor for medical advice about side effects. You may report side effects to FDA at 1-800-FDA-1088. Where should I keep my medicine? This drug is given in a hospital or clinic and will not be stored at home. NOTE: This sheet is a summary. It may not cover all possible information. If you have questions about this medicine, talk to your doctor, pharmacist, or health care provider.    2016, Elsevier/Gold Standard. (2013-05-25 11:10:35)  

## 2017-06-04 NOTE — Telephone Encounter (Signed)
Please advise if okay to refill  Pt last seen by TP on 03/07/17  Next ov with MW 07/07/17 Last rx called in 04/22/17  While reviewing her med list I noticed she is on lisinopril- FYI

## 2017-06-04 NOTE — Telephone Encounter (Signed)
Rx for Tylenol #4 has been faxed to [facility] in [place]. ACT pt regarding switching to [facility] answer with no option to leave vm.

## 2017-06-04 NOTE — Telephone Encounter (Signed)
Ok to approve but needs to stop lisinopril and start losartan 50 mg daily in its place x 11 refills

## 2017-06-28 ENCOUNTER — Other Ambulatory Visit: Payer: Self-pay | Admitting: Cardiology

## 2017-06-28 DIAGNOSIS — I119 Hypertensive heart disease without heart failure: Secondary | ICD-10-CM

## 2017-06-28 DIAGNOSIS — R072 Precordial pain: Secondary | ICD-10-CM

## 2017-07-02 ENCOUNTER — Inpatient Hospital Stay: Payer: Medicare Other | Attending: Oncology

## 2017-07-02 ENCOUNTER — Other Ambulatory Visit: Payer: Self-pay | Admitting: Internal Medicine

## 2017-07-02 ENCOUNTER — Telehealth: Payer: Self-pay | Admitting: Oncology

## 2017-07-02 ENCOUNTER — Inpatient Hospital Stay: Payer: Medicare Other

## 2017-07-02 VITALS — BP 190/95 | HR 94 | Temp 98.2°F

## 2017-07-02 DIAGNOSIS — C50412 Malignant neoplasm of upper-outer quadrant of left female breast: Secondary | ICD-10-CM

## 2017-07-02 DIAGNOSIS — Z5111 Encounter for antineoplastic chemotherapy: Secondary | ICD-10-CM | POA: Diagnosis not present

## 2017-07-02 DIAGNOSIS — Z171 Estrogen receptor negative status [ER-]: Secondary | ICD-10-CM

## 2017-07-02 LAB — COMPREHENSIVE METABOLIC PANEL
ALBUMIN: 3.6 g/dL (ref 3.5–5.0)
ALT: 29 U/L (ref 0–55)
ANION GAP: 9 (ref 3–11)
AST: 23 U/L (ref 5–34)
Alkaline Phosphatase: 76 U/L (ref 40–150)
BILIRUBIN TOTAL: 0.4 mg/dL (ref 0.2–1.2)
BUN: 11 mg/dL (ref 7–26)
CO2: 29 mmol/L (ref 22–29)
Calcium: 9.5 mg/dL (ref 8.4–10.4)
Chloride: 107 mmol/L (ref 98–109)
Creatinine, Ser: 1.09 mg/dL (ref 0.60–1.10)
GFR calc Af Amer: >60 mL/min (ref >60)
GFR calc non Af Amer: >60 mL/min (ref >60)
GLUCOSE: 97 mg/dL (ref 70–140)
Potassium: 3.6 mmol/L (ref 3.5–5.1)
SODIUM: 145 mmol/L (ref 136–145)
TOTAL PROTEIN: 7.6 g/dL (ref 6.4–8.3)

## 2017-07-02 LAB — CBC WITH DIFFERENTIAL/PLATELET
BASOS PCT: 1 %
Basophils Absolute: 0 10*3/uL (ref 0.0–0.1)
Eosinophils Absolute: 0.1 10*3/uL (ref 0.0–0.5)
Eosinophils Relative: 2 %
HCT: 41.1 % (ref 34.8–46.6)
Hemoglobin: 13.4 g/dL (ref 11.6–15.9)
LYMPHS PCT: 30 %
Lymphs Abs: 1.8 10*3/uL (ref 0.9–3.3)
MCH: 28.4 pg (ref 25.1–34.0)
MCHC: 32.7 g/dL (ref 31.5–36.0)
MCV: 87 fL (ref 79.5–101.0)
MONO ABS: 0.3 10*3/uL (ref 0.1–0.9)
Monocytes Relative: 5 %
NEUTROS PCT: 62 %
Neutro Abs: 3.8 10*3/uL (ref 1.5–6.5)
PLATELETS: 205 10*3/uL (ref 145–400)
RBC: 4.72 MIL/uL (ref 3.70–5.45)
RDW: 14.6 % — ABNORMAL HIGH (ref 11.2–14.5)
WBC: 6 10*3/uL (ref 3.9–10.3)

## 2017-07-02 MED ORDER — GOSERELIN ACETATE 3.6 MG ~~LOC~~ IMPL
3.6000 mg | DRUG_IMPLANT | Freq: Once | SUBCUTANEOUS | Status: AC
  Administered 2017-07-02: 3.6 mg via SUBCUTANEOUS

## 2017-07-02 MED ORDER — GOSERELIN ACETATE 3.6 MG ~~LOC~~ IMPL
DRUG_IMPLANT | SUBCUTANEOUS | Status: AC
  Filled 2017-07-02: qty 3.6

## 2017-07-02 NOTE — Telephone Encounter (Signed)
Pt was to follow up in January.  Can we get her back in please?

## 2017-07-02 NOTE — Telephone Encounter (Signed)
Scheduled patient for upcoming injection appointments

## 2017-07-02 NOTE — Patient Instructions (Signed)
Goserelin injection What is this medicine? GOSERELIN (GOE se rel in) is similar to a hormone found in the body. It lowers the amount of sex hormones that the body makes. Men will have lower testosterone levels and women will have lower estrogen levels while taking this medicine. In men, this medicine is used to treat prostate cancer; the injection is either given once per month or once every 12 weeks. A once per month injection (only) is used to treat women with endometriosis, dysfunctional uterine bleeding, or advanced breast cancer. This medicine may be used for other purposes; ask your health care provider or pharmacist if you have questions. What should I tell my health care provider before I take this medicine? They need to know if you have any of these conditions (some only apply to women): -diabetes -heart disease or previous heart attack -high blood pressure -high cholesterol -kidney disease -osteoporosis or low bone density -problems passing urine -spinal cord injury -stroke -tobacco smoker -an unusual or allergic reaction to goserelin, hormone therapy, other medicines, foods, dyes, or preservatives -pregnant or trying to get pregnant -breast-feeding How should I use this medicine? This medicine is for injection under the skin. It is given by a health care professional in a hospital or clinic setting. Men receive this injection once every 4 weeks or once every 12 weeks. Women will only receive the once every 4 weeks injection. Talk to your pediatrician regarding the use of this medicine in children. Special care may be needed. Overdosage: If you think you have taken too much of this medicine contact a poison control center or emergency room at once. NOTE: This medicine is only for you. Do not share this medicine with others. What if I miss a dose? It is important not to miss your dose. Call your doctor or health care professional if you are unable to keep an appointment. What may  interact with this medicine? -female hormones like estrogen -herbal or dietary supplements like black cohosh, chasteberry, or DHEA -female hormones like testosterone -prasterone This list may not describe all possible interactions. Give your health care provider a list of all the medicines, herbs, non-prescription drugs, or dietary supplements you use. Also tell them if you smoke, drink alcohol, or use illegal drugs. Some items may interact with your medicine. What should I watch for while using this medicine? Visit your doctor or health care professional for regular checks on your progress. Your symptoms may appear to get worse during the first weeks of this therapy. Tell your doctor or healthcare professional if your symptoms do not start to get better or if they get worse after this time. Your bones may get weaker if you take this medicine for a long time. If you smoke or frequently drink alcohol you may increase your risk of bone loss. A family history of osteoporosis, chronic use of drugs for seizures (convulsions), or corticosteroids can also increase your risk of bone loss. Talk to your doctor about how to keep your bones strong. This medicine should stop regular monthly menstration in women. Tell your doctor if you continue to menstrate. Women should not become pregnant while taking this medicine or for 12 weeks after stopping this medicine. Women should inform their doctor if they wish to become pregnant or think they might be pregnant. There is a potential for serious side effects to an unborn child. Talk to your health care professional or pharmacist for more information. Do not breast-feed an infant while taking this medicine. Men should   inform their doctors if they wish to father a child. This medicine may lower sperm counts. Talk to your health care professional or pharmacist for more information. What side effects may I notice from receiving this medicine? Side effects that you should  report to your doctor or health care professional as soon as possible: -allergic reactions like skin rash, itching or hives, swelling of the face, lips, or tongue -bone pain -breathing problems -changes in vision -chest pain -feeling faint or lightheaded, falls -fever, chills -pain, swelling, warmth in the leg -pain, tingling, numbness in the hands or feet -signs and symptoms of low blood pressure like dizziness; feeling faint or lightheaded, falls; unusually weak or tired -stomach pain -swelling of the ankles, feet, hands -trouble passing urine or change in the amount of urine -unusually high or low blood pressure -unusually weak or tired Side effects that usually do not require medical attention (report to your doctor or health care professional if they continue or are bothersome): -change in sex drive or performance -changes in breast size in both males and females -changes in emotions or moods -headache -hot flashes -irritation at site where injected -loss of appetite -skin problems like acne, dry skin -vaginal dryness This list may not describe all possible side effects. Call your doctor for medical advice about side effects. You may report side effects to FDA at 1-800-FDA-1088. Where should I keep my medicine? This drug is given in a hospital or clinic and will not be stored at home. NOTE: This sheet is a summary. It may not cover all possible information. If you have questions about this medicine, talk to your doctor, pharmacist, or health care provider.    2016, Elsevier/Gold Standard. (2013-05-25 11:10:35)  

## 2017-07-02 NOTE — Telephone Encounter (Signed)
Tried to call patient. No answer and no voicemail picked up.

## 2017-07-07 ENCOUNTER — Encounter: Payer: Self-pay | Admitting: Internal Medicine

## 2017-07-07 ENCOUNTER — Ambulatory Visit (INDEPENDENT_AMBULATORY_CARE_PROVIDER_SITE_OTHER): Payer: Medicare Other | Admitting: Internal Medicine

## 2017-07-07 VITALS — BP 128/88 | HR 109 | Ht 66.0 in | Wt 198.6 lb

## 2017-07-07 DIAGNOSIS — J45991 Cough variant asthma: Secondary | ICD-10-CM

## 2017-07-07 DIAGNOSIS — R05 Cough: Secondary | ICD-10-CM

## 2017-07-07 DIAGNOSIS — R058 Other specified cough: Secondary | ICD-10-CM

## 2017-07-07 MED ORDER — PREDNISONE 10 MG PO TABS: ORAL_TABLET | ORAL | 0 refills | Status: DC

## 2017-07-07 NOTE — Assessment & Plan Note (Addendum)
- PFT's 01/31/2011 FEV1  1.76 (60% ) ratio 68 and no better p B2,  DLCO 70% -med review 09/25/2012 > Dulera drug assistance  paperwork given    - HFA 90% 11/20/2012 .  -Med calendar 12/07/12 , 04/06/2013 , 04/18/2014,, 08/30/2014 , 11/08/2014  - PFT's 01/12/2013  1.73 (65%)  Ratio 73 and no better p B2, DLCO 86% - trial off singulair 02/08/13 >>>did not stop  - 04/05/2014  trial of dulera 100 instead of 200 > worse  -04/18/2014 increase Dulera 200  - 08/01/2014 p extensive coaching HFA effectiveness =    90% - 08/01/2014  Walked RA x 2laps @ 185 ft each stopped due to cough > sob/ no desat moderate pace  - spirometry 08/01/2014  FEV1  1.87 (70%) ratio 78  fef 25-75 53%  - spirometry 02/10/2015  flattening of top portion of f/v loop only    - 02/10/2015   Walked RA  2 laps @ 185 ft each stopped due to cough > sob/ slow pace   -  NO 03/24/2015   =  16 - Methacholine challenge 04/04/2015   POS asthma = wheeze/sob but no coughing fits > added qvar 80 2bid 06/29/2015  - Allergy profile 11/17/2015 >  Eos 0.1 /  IgE  80 POS RAST  to grass, ragweed, cedar tree   - 08/31/2015  After extensive coaching HFA effectiveness =    75% (Ti too short)  - Spirometry 08/31/2015  FEV1 0.75 (28%)  Ratio 68  - FENO 11/17/2015  =  8  on symb 160 2bid and qvar 80 2bid  - FENO 03/22/2016  = 36 on symb 160/qvar 80 though not able to verify being used as directed   - 07/07/2017  After extensive coaching inhaler device  effectiveness =    90%    DDX of  difficult airways management almost all start with A and  include Adherence, Ace Inhibitors, Acid Reflux, Active Sinus Disease, Alpha 1 Antitripsin deficiency, Anxiety masquerading as Airways dz,  ABPA,  Allergy(esp in young), Aspiration (esp in elderly), Adverse effects of meds,  Active smokers, A bunch of PE's (a small clot burden can't cause this syndrome unless there is already severe underlying pulm or vascular dz with poor reserve) plus two Bs  = Bronchiectasis and Beta blocker use..and one C=  CHF   Adherence is always the initial "prime suspect" and is a multilayered concern that requires a "trust but verify" approach in every patient - starting with knowing how to use medications, especially inhalers, correctly, keeping up with refills and understanding the fundamental difference between maintenance and prns vs those medications only taken for a very short course and then stopped and not refilled.  - see hfa teaching - despite best effort have not achieve full med reconciliation and now our [facility] does not reflect what meds she takes as she says she was placed on meds she was told to stop but they are still on the list but gave me a different list than nursing and I told her quite frankly I would not be able to continue to participate in her care if this continues    ? Acid (or non-acid) GERD > always difficult to exclude as up to 75% of pts in some series report no assoc GI/ Heartburn symptoms> rec continue max (24h)  acid suppression and diet restrictions/ reviewed     ? Allergy > continue singulair/ topical steroids and  if not better: add Prednisone 10 mg  take  4 each am x 2 days,   2 each am x 2 days,  1 each am x 2 days and stop  ? Active sinus dz> certainly has bad rhinitis if not sinusitis and doubt using topical streroids correctly/ reviewed (ee avs for instructions unique to this ov)  ? Anxiety/depression > usually at the bottom of this list of usual suspects but should be   higher on this pt's based on H and P and note already on psychotropics and may interfere with adherence and also interpretation of response or lack thereof to symptom management which can be quite subjective.   ? ACEi > she says she's not taking them though listed >  would not add these under any circumstances to pt who already has chronic cough and non-specific difficult to control resp symptoms of unkown etiology  ? BB > In the setting of respiratory symptoms of unknown etiology,  It would be preferable to  use bystolic, the most beta -1  selective Beta blocker available in sample form, with bisoprolol the most selective generic choice  on the market, at least on a trial basis, to make sure the spillover Beta 2 effects of the less specific Beta blockers are not contributing to this patient's symptoms.   ? Chf/ cardiac asthma > she has diastolic dysfunction related to hbp, f/u cards planned.     I had an extended discussion with the patient reviewing all relevant studies completed to date and  lasting 25 minutes of a 40  minute extended  visit addressing med reconciliation and how we approach   severe non-specific but potentially very serious refractory respiratory symptoms of uncertain and potentially multiple  etiologies.   Each maintenance medication was reviewed in detail including most importantly the difference between maintenance and prns and under what circumstances the prns are to be triggered using an action plan format that is not reflected in the computer generated alphabetically organized AVS.    Please see AVS for specific instructions unique to this office visit that I personally wrote and verbalized to the the pt in detail and then reviewed with pt  by my nurse highlighting any changes in therapy/plan of care  recommended at today's visit.

## 2017-07-07 NOTE — Progress Notes (Signed)
Subjective:   Patient ID: Erica [name], female     DOB: Mar 05, 1976    MRN: [id number]    Brief patient profile:  36 yobf with asthma all her life much worse since around 07/2010 referred by Dr [name] to pulmonary clinic in Sept 2012 Hx of Breast Cancer 05/2008    History of Present Illness  12/18/2010 Initial pulmonary office eval on acei and  advair cc dtc asthma x 4 months but baseline = excess saba use "even when better" at least twice daily despite daily advair, worse at hs in fact  used saba 3 x last night.  On chemo /RT for breast ca but last rx was Jan 2012 well before asthma worse.  To er multiple times >  no better with singular added x 3 months. rec Stop Lisinopril Dulera 200 Take 2 puffs first thing in am and then another 2 puffs about 12 hours later.  Work on inhaler technique  Nexium 40 mg   Take 30-60 min before first meal of the day and Pepcid 20 mg one bedtime until return Only use your albuterol as a rescue medication to be used if you can't catch your breath by resting or doing a relaxed purse lip breathing pattern. The less you use it, the better it will work when you need it.  Prednisone 10 mg take  4 each am x 2 days,   2 each am x 2 days,  1 each am x2days and stop    01/03/2011 f/u ov/Wert cc much better off ace and advair but still needing saba twice daily on avg. No  Purulent sputum. rec no change in rx/ rule of 2's reviewed     04/06/2013 NP Follow up and Med Review   We reviewed all her meds and organized them into a med calendar. She does have Medicaid but depends on samples. Did not bring all her meds with her today , says they are at home.  Says her breathing is the same since last ov w/ SOB, Has on /off wheezing, some chest heaviness, prod cough with yellow mucus. Good and bad days. Wears out easily .  Denies f/c/s, nausea, vomiting.  Did not stop singulair as recommended.  rec  Follow med calendar closely and bring to each visit > did not do  We are  referring you for a CPSTwith spirometry before and after> never done   04/18/2014 NP Follow up and Med Review / NP  Patient returns for a two-week follow-up and medication review. We reviewed all her medications organize them into a medication count with patient education It appears that she is taking her medications correctly, however, she has over 30 medications and has had several changes recently. Last visit, her [name] was decreased to the 119mcg says that her breathing was better on the 224mcg Patient had a chest x-ray last visit that showed no acute changes She underwent a CT sinus due to persistent cough , which was positive for sinusitis on the right She was treated with a ten-day course of Avelox, she has one dose left Does feel some better but still has cough . She denies any hemoptysis, orthopnea, PND or leg swelling  Patient has upcoming sleep study. Next week for suspected underlying sleep apnea with snoring and daytime sleepiness rec Refer to ENT for sinusitis and chronic cough > never saw one> f/u sinus CT neg 07/26/14  Increase Dulera 200 2 puffs Twice daily   Follow med calendar closely and  bring to each visit.     08/01/2014 f/u ov/Wert re:  Asthma plus noct "gasping"  Chief Complaint  Patient presents with  . Follow-up    doing well.  no complaints.  not doing well x one year with noct / early  4am with cough/ gasping for air almost always goes to neb and then can't go back to sleep even 30 min later better Daytime in chest tightness resolves  completely while on prednisone last took one month prior to OV  And doesn't have it now but has exact same noct symptoms and overusing saba  Doe x mailbox and back to house out of breath and coughing  >>change dulera 200 to 100   08/30/2014 NP Follow up and Med review : Asthma Patient presents for a one-month follow-up We reviewed all her medications organize them into a medication count with patient education Patient has had  multiple medication changes recently She has been having difficulty with resistant hypertension and visual changes. Recently started on primidone and spironolactone . However, has not started this as of yet That she needs to go pick this up at the pharmacy. Neuro notes reviewed , has been referred to NS . B/p is elevated again at todays visit, discussed follow up with PCP for b/p management.  She tried to decrease her Dulera as recommended from last ov with Dr. Melvyn Novas    however, was unable to and is back on 200 dose .  Says overall her breathing is doing ok  No flare of cough or wheezing  Does have a lot of drainage in throat . Discussed starting on chlortrimeton.  rec Continue Dulera 200 2 puffs Twice daily  , rinse after use  Follow med calendar closely and bring to each visit.  Follow up with Primary MD concerning elevated blood pressure    10/05/2014 f/u ov/Wert re: asthma with component of uacs/ no med calendar  Chief Complaint  Patient presents with  . Follow-up    Here to discuss meds- Dulera not on her drug formulary. Pt states breathing is doing well and denies any new co's today.  turns out using neb at least once every 24 h, esp between 2 and 4 am when wakes up with severe dry cough x months/ pred helps some but never eliminated noct cough rec symbicort 160 Take 2 puffs first thing in am and then another 2 puffs about 12 hours later.  Try taking the tramadol around 2 am to see if helps with the early am cough/ wheeze  See calendar for specific medication instructions and bring it back for each and every office visit  Separating the top medications from the bottom group is fundamental to providing you adequate care going forward.   Please schedule a follow up office visit in 2 weeks, sooner if needed with all medications in 2 separate bags    10/19/2014 f/u ov/Wert re:  Asthma vs uacs/ not using med calendar correclty  Chief Complaint  Patient presents with  . Follow-up     Cough has changed sicnce starting symbicort- prod cough with "dirty" sputum. She states she feels that she can tell when her PM dose is due b/c she starts feeling SOB. She is using proair once per day on average.   not using symbicort 160 correctly  rec Prednisone 10 mg take  4 each am x 2 days,  2 each am x 2 days,  1 each am x 2 days and stop  Bystolic 10  mg take twice daily until return  Take 2 chlortrimeton at bedtime and 2 more 4 hours later  Continue symbicort 160 Take 2 puffs first thing in am and then another 2 puffs about 12 hours later.  Only use your albuterol as rescue  Only use nebulizer if you try proventil first and it fails to work    11/08/2014 Follow up : Asthma /UAC   Pt returns for follow up .  Last ov with asthma flare , tx with pred taper  She is feeling better Breathing is doing better and back to her baseline  Still has dry cough but some better.  We reviewed all her meds and organized them into a med calendar with pt education  Appears to be taking correctly although she is on over 30 medications .  B/p is improved on current regimen.  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 Follow-up    C/o increase SOB, prod cough (tan color phlem), wheezing, chest tx, nasal cong  worse x one week but at baseline doe x more than slow walk and neb q day around 4 am as the hfa not helping with noct symptoms no better even on prednisone rec Prednisone 10 mg take  4 each am x 2 days,  2 each am x 2 days,  1 each am x 2 days and stop      03/24/2015 f/u  Pulmonary office visit/ Wert  Asthma vs vcd Chief Complaint  Patient presents with  . Follow-up    Pt c/o increased cough with tan mucus, wheeze and SOB. Pt reports increase in symptoms with weather changes. Pt has used albuterol HFA with some symptom relief. Pt denies f/n/v/d.   2 weeks prior to OV  Noted needing the noct neb again  where this problem had resolved on pred  then 2 d after that bad cough p [employer] shopping and worse since then but still comfortable p saba rx rec Prednisone 10 mg take  4 each am x 2 days,   2 each am x 2 days,  1 each am x 2 days and stop  For cough use mucinex dm up to 1200 mg every 12 hours and the flutter valve as much as possible and if still can't control the cough > tramadol as per action plan on med calendar    06/29/2015  f/u ov/Wert re: asthma and unrelated cough / no med calendar  Chief Complaint  Patient presents with  . Follow-up    Cough has not improved since she was last seen.  She is using albuterol inhaler 2 x daily and neb with albuterol 1 x daily on average.   MCT did not reproduce the cough but the sob and wheeze  Unless w/in two weeks of taking prednisone, resumes 4 am wheezing/ gasping for air> almost every night needs to back up proair with neb  On dex daily taken before supper  avg use of tramadol is 6 x in 24 h unless on pred  Prednisone 10 mg take  4 each am x 2 days,   2 each am x 2 days,  1 each am x 2 days and stop  Qvar 80 Take 2 puffs first thing in am and then another 2 puffs about 12 hours later.  change dexilant Take 30-60 min before first meal of the day and Add Pepcid 20 mg at  bedtime  For drainage / throat tickle try take CHLORPHENIRAMINE  4 mg - take one every 4 hours as needed - available over the counter- may cause drowsiness so start with just a bedtime dose or two and see how you tolerate it before trying in daytime   See Tammy NP w/in 2 weeks(or next available after than)  with all your medications,  At f/u (given one sample = 120 pffs)     07/18/2015 NP  Follow up : Asthma vs Cough  Pt returns for 2 week follow up . Seen last ov with Asthma flare , tx w/ steroid taper.  She is feeling better. Still has some lingering cough on/off.  QVAR was added to her symbicort .  We reviewed all her meds and organized them into a med calendar with pt  education.  Appears to be taking correctly rec Follow med calendar    08/31/2015 acute extended ov/Wert re: asthma flare on maint rx with symb 160/qvar 80 2bid Chief Complaint  Patient presents with  . Acute Visit    Pt c/o wheezing, increased SOB, chest tightness and cough with white sputum x 2 days.   baseline still needed albuterol with activity regardless of time of day  Before was using neb nightly but that improved p last ov then p  cleaning out storage shed >  Tongue felt full /sob wheezing > 2 ER Trips 08/19/15  > benadryl/ pepcid no pred >  08/29/15 returned to ER > mgs02/ prednisone at 40 mg per day/ only used saba hfa today / cough less of a problem with this flare  rec Prednisone 40 mg x 4 days and 20 mg x 4 days and 10 mg daily x 4 days and off Add pepcid 20 mg one at bedtime  For cough > mucinex dm up to 1200 mg every 12 hours, supplement with tramadol and use the flutter valve as much as possible    11/17/2015  f/u ov/Wert re: 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 Follow-up    pt c/o prod cough with beige mucus, sob with exertion.     just using albuterol about half way thru each day for sob  Still wakes every morning around 4 am coughing unless actively taking prednisone and helps x 2 weeks then gradually worse again. 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 Follow-up    4wk rov  doing  better though still wakes up nightly with cough / denies using tramadol or albuterol much at all now  rec For drainage / throat tickle>  try take CHLORPHENIRAMINE  4 mg - take one every 4 hours as needed - - always take 2 at bedtime     Care everywhere notes reviewed from neurosurgery  from [facility] , recent right frontal VP shunt  rec at d/c 01/25/16 d/c reglan/ hycodan for cough on med sheet   02/08/16 Follow up : Cough variant asthma  Pt returns for follow up . Says her cough has picked back up last few weeks.  Was doing well and controlled on tramadol most days.  She ran out of tramadol few weeks prior to OV   Seen by PCP given hydromet which helps as well.  Would like refill of tramadol . We discussed chronic narcotic use w/ pt education.  Discussed additonal medication to help with chronic cough control.  rec May use Delsym 2 tsp Twice daily  As needed cough .  Add Tramadol 50mg  1 every 6hr as needed for cough - try to limit use as able .  Sips of water to soothe cough/throat.        03/22/2016 acute extended ov/Wert re: recurrent cough / no med calendar Chief Complaint  Patient presents with  . Acute Visit    Increased coughing, tramadol is not working.    worse x one month to point of gag/vomit no better with tramadol/ head hurts from coughing fits 24/7 s sob rec Change flovent to asmanex 200 2 bid    07/07/2017  f/u ov/Wert re: chronic cough/ asthma/ no med calendar/ no meds / on ACEi but hasn't started  Chief Complaint  Patient presents with  . Follow-up    voiced no concerns   Dyspnea:  MMRC3 = can't walk 100 yards even at a slow pace at a flat grade s stopping due to sob   Cough: some/ 4 am is better / taking Tyl #4 around 11 am  Sleep: better but still sev times a week wakes up coughing around 4 am (was q noct previously)  SABA use:  2 x daily / neb 2-3 per week   Using afrin though not on med cal   No obvious day to day or daytime variability or assoc excess/  purulent sputum or mucus plugs or hemoptysis or cp or chest tightness, subjective wheeze or overt sinus or hb symptoms. No unusual exposure hx or h/o childhood pna/ asthma or knowledge of premature birth.    Also denies any obvious fluctuation of symptoms with weather or environmental changes or other aggravating or alleviating factors except as outlined above   Current Allergies, Complete Past Medical History, Past Surgical History, Family History, and Social History were reviewed in [employer] record.  ROS  The following are not active complaints unless bolded Hoarseness, sore throat, dysphagia, dental problems, itching, sneezing,  nasal congestion or discharge of excess mucus or purulent secretions, ear ache,   fever, chills, sweats, unintended wt loss or wt gain, classically pleuritic or exertional cp,  orthopnea pnd or leg swelling, presyncope, palpitations, abdominal pain, anorexia, nausea, vomiting, diarrhea  or change in bowel habits or change in bladder habits, change in stools or change in urine, dysuria, hematuria,  rash, arthralgias, visual complaints, headache, numbness, weakness or ataxia or problems with walking or coordination,  change in mood/affect or memory.        Current Meds  Medication Sig  . acetaminophen-codeine (TYLENOL #4) 300-60 MG tablet TAKE 1 TABLET EVERY 4 HOURS AS NEEDED  . acetaZOLAMIDE (DIAMOX) 250 MG tablet Take 4 tablets (1,000 mg total) by mouth 3 (three) times daily.  Marland Kitchen albuterol (PROVENTIL) (2.5 MG/3ML) 0.083% nebulizer solution USE 1 VIAL VIA NEBULIZER EVERY 4 HOURS AND AS NEEDED FOR WHEEZING OR  SHORTNESS OF BREATH  . albuterol (VENTOLIN HFA) 108 (90 Base) MCG/ACT inhaler INHALE 2 PUFFS INTO THE LUNGS EVERY 4 HOURS AS NEEDED FOR WHEEZING OR SHORTNESS OF BREATH  . amitriptyline (ELAVIL) 100 MG tablet Take 100 mg by mouth at bedtime.  Marland Kitchen amLODipine (NORVASC) 10 MG tablet Take 1 tablet (10 mg total) by mouth daily.  . Biotin 10000 MCG  TABS Take 1 tablet by mouth daily.  . budesonide-formoterol (SYMBICORT) 160-4.5 MCG/ACT inhaler INHALE 2 PUFFS INTO LUNGS 2 TIMES DAILY AS DIRECTED  . carvedilol (COREG) 6.25 MG tablet Take 1 tablet (6.25 mg total) by mouth 2 (two) times daily.  . chlorpheniramine (CHLOR-TRIMETON) 4 MG tablet 2 tabs by mouth at bedtime.  May take 1 extra every 4 hours as needed  . chlorthalidone (HYGROTON) 25 MG tablet TAKE 1 TABLET (25 MG TOTAL) DAILY BY MOUTH.  . cloNIDine (CATAPRES) 0.3 MG tablet TAKE 1 TABLET BY MOUTH TWICE A DAY  . dexlansoprazole (DEXILANT) 60 MG capsule Take 1 capsule (60 mg total) by mouth daily.  Marland Kitchen dextromethorphan-guaiFENesin (MUCINEX DM) 30-600 MG 12hr tablet Take 1 tablet by mouth 2 (two) times daily.   Marland Kitchen dicyclomine (BENTYL) 20 MG tablet TAKE 1 TABLET BY MOUTH 3 TIMES DAILY AS NEEDED FOR SPASMS  . diltiazem (MATZIM LA) 420 MG 24 hr tablet Take 420 mg by mouth daily.  . DULoxetine (CYMBALTA) 30 MG capsule Take 1 capsule (30 mg total) by mouth daily. (Patient taking differently: Take 30 mg by mouth daily. With 60mg  for total of 90 mg)  . DULoxetine (CYMBALTA) 60 MG capsule Take 1 capsule (60 mg total) by mouth daily. (Patient taking differently: Take 60 mg by mouth daily. For total of 90 mg)  . EPINEPHRINE 0.3 mg/0.3 mL IJ SOAJ injection INJECT 0.3 MLS INTO THE MUSCLE ONCE AS NEEDED FOR ANAPHYLAXIS  . hydrALAZINE (APRESOLINE) 100 MG tablet TAKE 1 TABLET (100 MG TOTAL) BY MOUTH 3 (THREE) TIMES DAILY.  . hydrALAZINE (APRESOLINE) 25 MG tablet TAKE 1 TABLET BY MOUTH 3 (THREE) TIMES A DAY  . lidocaine-prilocaine (EMLA) cream Apply 1 application topically. Apply 1 application to skin ever 28 days  . LORazepam (ATIVAN) 0.5 MG tablet Take 0.5 mg by mouth every 8 (eight) hours.  Marland Kitchen MATZIM LA 420 MG 24 hr tablet TAKE 1 TABLET BY MOUTH EVERY DAY  . metoCLOPramide (REGLAN) 10 MG tablet TAKE 1 TABLET BY MOUTH 4 TIMES A DAY WITH MEALS AND AT BEDTIME  . Mometasone Furoate (ASMANEX HFA) 200 MCG/ACT AERO  Inhale 2 puffs into the lungs 2 (two) times daily.  . montelukast (SINGULAIR) 10 MG tablet TAKE 1 TABLET BY MOUTH ONCE DAILY AT BEDTIME  . NITROSTAT 0.4 MG SL tablet PLACE 1 TABLET UNDER TONGUE EVERY 5 MINUTES AS NEEDED FOR CHEST PAIN, MAX OF 3 TABLETS THEN CALL 911 IF PAIN PERSISTS  . OXYGEN Use CPAP at bedtime  . oxymetazoline (MUCINEX NASAL SPRAY FULL FORCE) 0.05 % nasal spray Place 1 spray into both nostrils 2 (two) times daily.  . potassium chloride SA (K-DUR,KLOR-CON) 20 MEQ tablet Take 20 mEq by mouth 2 (two) times daily.  . primidone (MYSOLINE) 50 MG tablet Take 0.5 tablets (25 mg total) by mouth at bedtime.  . promethazine (PHENERGAN) 25 MG tablet Take 1 tablet (25 mg total) by mouth 3 (three) times daily as needed for nausea or vomiting.  Marland Kitchen RANEXA 500 MG 12 hr tablet TAKE 1 TABLET BY MOUTH TWICE A DAY  . ranitidine (ZANTAC) 150 MG tablet Take  1 tablet (150 mg total) by mouth 2 (two) times daily.  Marland Kitchen Respiratory Therapy Supplies (FLUTTER) DEVI Use as directed  . spironolactone (ALDACTONE) 100 MG tablet Take 1 tablet (100 mg total) by mouth daily.  . SYMBICORT 160-4.5 MCG/ACT inhaler INHALE 2 PUFFS INTO LUNGS 2 TIMES DAILY AS DIRECTED  . tapentadol HCl (NUCYNTA) 75 MG tablet Take 1 tablet (75 mg total) every 6 (six) hours as needed by mouth.  . topiramate (TOPAMAX) 25 MG tablet Take 25 mg by mouth 2 (two) times daily.  . traZODone (DESYREL) 150 MG tablet TAKE 1 TABLET BY MOUTH AT BEDTIME                   Objective:   Physical Exam   amb obese bf with sniffles   Vital signs reviewed - Note on arrival 02 sats  99% on RA and BP 128/88    Wt 213  12/18/10 > 01/03/2011  214 > 01/31/2011  206 >  09/11/2012 209 >208 6/19 > 210 09/25/2012 > 11/20/2012 211 >208 12/07/12> 01/12/2013 209 > 02/09/2013 210 >207 04/06/2013 > 07/14/2013  213>212 07/28/2013 >204 01/26/2014> 04/04/2014 206  >205 04/18/2014 >  07/18/2014 201 >  08/01/14 201 >198 08/30/2014 > 10/05/14  195 > 10/19/2014 192 > 191 11/08/2014 >  02/10/2015  198 > 03/24/2015   198 > 06/29/2015   201 > 08/31/2015  204 > 11/17/2015  197 > 12/15/2015  188 > 03/22/2016   188 > 07/07/2017  198         07/07/2017 HEENT: nl dentition,  and oropharynx. Nl external ear canals without cough reflex - very severe  bilateral non-specific turbinate edema  With watery secretions only    NECK :  without JVD/Nodes/TM/ nl carotid upstrokes bilaterally   LUNGS: no acc muscle use,  Nl contour chest with mostly pseudowheeze   CV:  RRR  no s3 or murmur or increase in P2, and no edema   ABD:  Quite obese but soft and nontender with nl inspiratory excursion in the supine position. No bruits or organomegaly appreciated, bowel sounds nl  MS:  Nl gait/ ext warm without deformities, calf tenderness, cyanosis or clubbing No obvious joint restrictions   SKIN: warm and dry without lesions    NEURO:  alert, approp, nl sensorium with  no motor or cerebellar deficits apparent.

## 2017-07-07 NOTE — Patient Instructions (Addendum)
I emphasized that nasal steroids have no immediate benefit in terms of improving symptoms.  To help them reached the target tissue, the patient should use Afrin two puffs every 12 hours applied one min before using the nasal steroids.  Afrin should be stopped after no more than 5 days.  If the symptoms worsen, Afrin can be restarted after 5 days off of therapy to prevent rebound congestion from overuse of Afrin.  I also emphasized that in no way are nasal steroids a concern in terms of "addiction".   If not better >  Prednisone 10 mg take  4 each am x 2 days,   2 each am x 2 days,  1 each am x 2 days and stop   If still not better call [facility] at [phone] for CT sinus    See calendar for specific medication instructions and bring it back for each and every office visit for every healthcare provider you see.  Without it,  you may not receive the best quality medical care that we feel you deserve.  You will note that the calendar groups together  your maintenance  medications that are timed at particular times of the day.  Think of this as your checklist for what your doctor has instructed you to do until your next evaluation to see what benefit  there is  to staying on a consistent group of medications intended to keep you well.  The other group at the bottom is entirely up to you to use as you see fit  for specific symptoms that may arise between visits that require you to treat them on an as needed basis.  Think of this as your action plan or "what if" list.   Separating the top medications from the bottom group is fundamental to providing you adequate care going forward.    Please schedule a follow up office visit in 4 weeks, sooner if needed  with all medications /inhalers/ solutions in hand so we can verify exactly what you are taking. This includes all medications from all doctors and over the counters  - they should be separated like your med calendar in two bags:  Maintenance vs as needed

## 2017-07-07 NOTE — Assessment & Plan Note (Signed)
MRI 05/18/15 > Paranasal sinuses are clear - 11/17/2015 rec increase h1 to 2 hs to help with noct cough  - Allergy profile 11/17/2015 >  Eos 0.1 /  IgE  80 POS RAST  to grass, ragweed, cedar tree    I emphasized that nasal steroids have no immediate benefit in terms of improving symptoms.  To help them reached the target tissue, the patient should use Afrin two puffs every 12 hours applied one min before using the nasal steroids.  Afrin should be stopped after no more than 5 days.  If the symptoms worsen, Afrin can be restarted after 5 days off of therapy to prevent rebound congestion from overuse of Afrin.  I also emphasized that in no way are nasal steroids a concern in terms of "addiction".   If not better > Prednisone 10 mg take  4 each am x 2 days,   2 each am x 2 days,  1 each am x 2 days and stop  If still not better > sinus ct

## 2017-07-09 ENCOUNTER — Other Ambulatory Visit: Payer: Self-pay | Admitting: Neurology

## 2017-07-30 ENCOUNTER — Inpatient Hospital Stay: Payer: Medicare Other | Attending: Oncology

## 2017-07-30 ENCOUNTER — Inpatient Hospital Stay: Payer: Medicare Other

## 2017-07-30 VITALS — BP 157/87 | HR 93 | Temp 98.6°F

## 2017-07-30 DIAGNOSIS — Z171 Estrogen receptor negative status [ER-]: Secondary | ICD-10-CM

## 2017-07-30 DIAGNOSIS — C50412 Malignant neoplasm of upper-outer quadrant of left female breast: Secondary | ICD-10-CM | POA: Insufficient documentation

## 2017-07-30 DIAGNOSIS — Z5111 Encounter for antineoplastic chemotherapy: Secondary | ICD-10-CM | POA: Insufficient documentation

## 2017-07-30 LAB — COMPREHENSIVE METABOLIC PANEL
ALBUMIN: 3.6 g/dL (ref 3.5–5.0)
ALK PHOS: 70 U/L (ref 40–150)
ALT: 25 U/L (ref 0–55)
ANION GAP: 9 (ref 3–11)
AST: 22 U/L (ref 5–34)
BILIRUBIN TOTAL: 0.4 mg/dL (ref 0.2–1.2)
BUN: 12 mg/dL (ref 7–26)
CALCIUM: 9.2 mg/dL (ref 8.4–10.4)
CO2: 26 mmol/L (ref 22–29)
CREATININE: 1.05 mg/dL (ref 0.60–1.10)
Chloride: 106 mmol/L (ref 98–109)
GFR calc non Af Amer: >60 mL/min (ref >60)
GLUCOSE: 107 mg/dL (ref 70–140)
Potassium: 3.3 mmol/L — ABNORMAL LOW (ref 3.5–5.1)
Sodium: 141 mmol/L (ref 136–145)
TOTAL PROTEIN: 7.4 g/dL (ref 6.4–8.3)

## 2017-07-30 LAB — CBC WITH DIFFERENTIAL/PLATELET
Basophils Absolute: 0 K/uL (ref 0.0–0.1)
Basophils Relative: 1 %
Eosinophils Absolute: 0.1 K/uL (ref 0.0–0.5)
Eosinophils Relative: 1 %
HCT: 40.6 % (ref 34.8–46.6)
Hemoglobin: 13.6 g/dL (ref 11.6–15.9)
Lymphocytes Relative: 30 %
Lymphs Abs: 1.8 K/uL (ref 0.9–3.3)
MCH: 29.4 pg (ref 25.1–34.0)
MCHC: 33.5 g/dL (ref 31.5–36.0)
MCV: 87.9 fL (ref 79.5–101.0)
Monocytes Absolute: 0.3 K/uL (ref 0.1–0.9)
Monocytes Relative: 5 %
Neutro Abs: 3.8 K/uL (ref 1.5–6.5)
Neutrophils Relative %: 63 %
Platelets: 234 K/uL (ref 145–400)
RBC: 4.62 MIL/uL (ref 3.70–5.45)
RDW: 14.1 % (ref 11.2–14.5)
WBC: 6 K/uL (ref 3.9–10.3)

## 2017-07-30 MED ORDER — GOSERELIN ACETATE 3.6 MG ~~LOC~~ IMPL
3.6000 mg | DRUG_IMPLANT | Freq: Once | SUBCUTANEOUS | Status: AC
  Administered 2017-07-30: 3.6 mg via SUBCUTANEOUS

## 2017-07-30 MED ORDER — GOSERELIN ACETATE 3.6 MG ~~LOC~~ IMPL
DRUG_IMPLANT | SUBCUTANEOUS | Status: AC
  Filled 2017-07-30: qty 3.6

## 2017-08-01 ENCOUNTER — Telehealth: Payer: Self-pay

## 2017-08-01 NOTE — Telephone Encounter (Signed)
-----   Message from Gardenia Phlegm, NP sent at 07/31/2017  8:19 AM EDT ----- Patient potassium is 3.3.  Will you please encouarge increased potassium in her diet ----- Message ----- From: Interface, Lab In [place] Sent: 07/30/2017  11:12 AM To: Chauncey Cruel, MD

## 2017-08-01 NOTE — Telephone Encounter (Signed)
Spoke with patient about lab result potassium 3.3.  Recommendations per NP to increase potassium in diet.  Patient voiced understanding.  No further questions.  Knows to call center with issues/concerns.

## 2017-08-04 ENCOUNTER — Ambulatory Visit: Payer: Medicare Other | Admitting: Internal Medicine

## 2017-08-17 ENCOUNTER — Other Ambulatory Visit: Payer: Self-pay | Admitting: Cardiology

## 2017-08-18 ENCOUNTER — Ambulatory Visit: Payer: Medicare Other | Admitting: Internal Medicine

## 2017-08-19 ENCOUNTER — Telehealth: Payer: Self-pay | Admitting: Cardiology

## 2017-08-19 ENCOUNTER — Other Ambulatory Visit: Payer: Self-pay | Admitting: Cardiology

## 2017-08-19 MED ORDER — DILTIAZEM HCL ER COATED BEADS 420 MG PO TB24: 420.0000 mg | ORAL_TABLET | Freq: Every day | ORAL | 1 refills | Status: DC

## 2017-08-19 MED ORDER — DILTIAZEM HCL ER COATED BEADS 420 MG PO TB24: 420.0000 mg | ORAL_TABLET | Freq: Every day | ORAL | 0 refills | Status: DC

## 2017-08-19 NOTE — Telephone Encounter (Signed)
Pt's pharmacy [facility] [address], is requesting to replace Matzim LA, medication is no longer covered by pt's insurance. Please address

## 2017-08-19 NOTE — Telephone Encounter (Signed)
Dr. Meda Coffee, Georgina Peer, or [place], could you guys please assist with this? Pts insurance carrier is no longer covering the pts diltiazem (Matzim LA) 420 mg po daily.  Can you please advise on a replacement regimen? Thanks!

## 2017-08-19 NOTE — Telephone Encounter (Signed)
Pts Matzim LA was discontinued due to non-coverage, and new medication replacement is Cardizem LA 420 mg po daily, called in as generic for 90 day supply to the pts pharmacy, who initially contacted the office for this request.

## 2017-08-19 NOTE — Telephone Encounter (Signed)
There are two other diltiazem 420mg  extended release formulations, would see if patient's insurance covers one of them. Options would be: Cardizem LA 420mg  tablets or Tiazac 420mg  capsules. Both should be available as generics.

## 2017-08-19 NOTE — Telephone Encounter (Signed)
Thank you Megan.

## 2017-08-20 ENCOUNTER — Other Ambulatory Visit: Payer: Self-pay | Admitting: Internal Medicine

## 2017-08-27 ENCOUNTER — Inpatient Hospital Stay: Payer: Medicare Other

## 2017-08-27 ENCOUNTER — Telehealth: Payer: Self-pay

## 2017-08-27 DIAGNOSIS — Z5111 Encounter for antineoplastic chemotherapy: Secondary | ICD-10-CM | POA: Diagnosis not present

## 2017-08-27 DIAGNOSIS — C50412 Malignant neoplasm of upper-outer quadrant of left female breast: Secondary | ICD-10-CM | POA: Diagnosis not present

## 2017-08-27 DIAGNOSIS — Z171 Estrogen receptor negative status [ER-]: Secondary | ICD-10-CM

## 2017-08-27 LAB — CBC WITH DIFFERENTIAL/PLATELET
BASOS ABS: 0 10*3/uL (ref 0.0–0.1)
BASOS PCT: 0 %
EOS ABS: 0.1 10*3/uL (ref 0.0–0.5)
Eosinophils Relative: 2 %
HEMATOCRIT: 41.2 % (ref 34.8–46.6)
HEMOGLOBIN: 13.7 g/dL (ref 11.6–15.9)
Lymphocytes Relative: 29 %
Lymphs Abs: 2 10*3/uL (ref 0.9–3.3)
MCH: 28.9 pg (ref 25.1–34.0)
MCHC: 33.3 g/dL (ref 31.5–36.0)
MCV: 86.9 fL (ref 79.5–101.0)
Monocytes Absolute: 0.3 10*3/uL (ref 0.1–0.9)
Monocytes Relative: 5 %
NEUTROS ABS: 4.3 10*3/uL (ref 1.5–6.5)
NEUTROS PCT: 64 %
Platelets: 230 10*3/uL (ref 145–400)
RBC: 4.74 MIL/uL (ref 3.70–5.45)
RDW: 13.9 % (ref 11.2–14.5)
WBC: 6.8 10*3/uL (ref 3.9–10.3)

## 2017-08-27 LAB — COMPREHENSIVE METABOLIC PANEL
ALK PHOS: 68 U/L (ref 40–150)
ALT: 29 U/L (ref 0–55)
ANION GAP: 11 (ref 3–11)
AST: 21 U/L (ref 5–34)
Albumin: 3.7 g/dL (ref 3.5–5.0)
BILIRUBIN TOTAL: 0.3 mg/dL (ref 0.2–1.2)
BUN: 11 mg/dL (ref 7–26)
CALCIUM: 9.1 mg/dL (ref 8.4–10.4)
CO2: 25 mmol/L (ref 22–29)
CREATININE: 1 mg/dL (ref 0.60–1.10)
Chloride: 106 mmol/L (ref 98–109)
Glucose, Bld: 106 mg/dL (ref 70–140)
Potassium: 3 mmol/L — CL (ref 3.5–5.1)
Sodium: 142 mmol/L (ref 136–145)
TOTAL PROTEIN: 7.5 g/dL (ref 6.4–8.3)

## 2017-08-27 MED ORDER — GOSERELIN ACETATE 3.6 MG ~~LOC~~ IMPL
3.6000 mg | DRUG_IMPLANT | Freq: Once | SUBCUTANEOUS | Status: AC
Start: 2017-08-27 — End: 2017-08-27
  Administered 2017-08-27: 3.6 mg via SUBCUTANEOUS

## 2017-08-27 MED ORDER — GOSERELIN ACETATE 3.6 MG ~~LOC~~ IMPL
DRUG_IMPLANT | SUBCUTANEOUS | Status: AC
  Filled 2017-08-27: qty 3.6

## 2017-08-27 NOTE — Telephone Encounter (Signed)
This RN attempted to contact pt regarding her potassium level of 3.0.  Number on filed called.  Multiple rings with no answer, no vm.  Msg obtained stating "call cannot be completed at this time."

## 2017-08-27 NOTE — Telephone Encounter (Signed)
Spoke with pt by phone to inform her of potassium level of 3.0.  Pt was instructed to call physician's office who prescribes her potassium prescription to let them know.  Pt verbalizes understanding of instructions.

## 2017-08-29 ENCOUNTER — Other Ambulatory Visit: Payer: Self-pay | Admitting: Oncology

## 2017-09-19 DIAGNOSIS — S8002XA Contusion of left knee, initial encounter: Secondary | ICD-10-CM | POA: Diagnosis not present

## 2017-09-24 ENCOUNTER — Inpatient Hospital Stay: Payer: Medicare Other

## 2017-09-24 ENCOUNTER — Inpatient Hospital Stay: Payer: Medicare Other | Attending: Oncology

## 2017-09-24 DIAGNOSIS — Z171 Estrogen receptor negative status [ER-]: Secondary | ICD-10-CM

## 2017-09-24 DIAGNOSIS — Z5111 Encounter for antineoplastic chemotherapy: Secondary | ICD-10-CM | POA: Diagnosis not present

## 2017-09-24 DIAGNOSIS — C50412 Malignant neoplasm of upper-outer quadrant of left female breast: Secondary | ICD-10-CM | POA: Diagnosis not present

## 2017-09-24 LAB — CBC WITH DIFFERENTIAL/PLATELET
BASOS ABS: 0 10*3/uL (ref 0.0–0.1)
BASOS PCT: 1 %
EOS PCT: 2 %
Eosinophils Absolute: 0.1 10*3/uL (ref 0.0–0.5)
HCT: 40 % (ref 34.8–46.6)
Hemoglobin: 13.5 g/dL (ref 11.6–15.9)
Lymphocytes Relative: 29 %
Lymphs Abs: 1.6 10*3/uL (ref 0.9–3.3)
MCH: 29.3 pg (ref 25.1–34.0)
MCHC: 33.6 g/dL (ref 31.5–36.0)
MCV: 87.1 fL (ref 79.5–101.0)
MONO ABS: 0.4 10*3/uL (ref 0.1–0.9)
Monocytes Relative: 8 %
Neutro Abs: 3.5 10*3/uL (ref 1.5–6.5)
Neutrophils Relative %: 60 %
PLATELETS: 216 10*3/uL (ref 145–400)
RBC: 4.59 MIL/uL (ref 3.70–5.45)
RDW: 14.1 % (ref 11.2–14.5)
WBC: 5.6 10*3/uL (ref 3.9–10.3)

## 2017-09-24 LAB — COMPREHENSIVE METABOLIC PANEL
ALBUMIN: 3.8 g/dL (ref 3.5–5.0)
ALT: 30 U/L (ref 0–44)
AST: 21 U/L (ref 15–41)
Alkaline Phosphatase: 73 U/L (ref 38–126)
Anion gap: 8 (ref 5–15)
BUN: 15 mg/dL (ref 6–20)
CHLORIDE: 103 mmol/L (ref 98–111)
CO2: 31 mmol/L (ref 22–32)
Calcium: 9.6 mg/dL (ref 8.9–10.3)
Creatinine, Ser: 1.07 mg/dL — ABNORMAL HIGH (ref 0.44–1.00)
GFR calc Af Amer: >60 mL/min (ref >60)
GFR calc non Af Amer: >60 mL/min (ref >60)
GLUCOSE: 96 mg/dL (ref 70–99)
POTASSIUM: 3.1 mmol/L — AB (ref 3.5–5.1)
Sodium: 142 mmol/L (ref 135–145)
Total Bilirubin: 0.5 mg/dL (ref 0.3–1.2)
Total Protein: 7.7 g/dL (ref 6.5–8.1)

## 2017-09-24 MED ORDER — GOSERELIN ACETATE 3.6 MG ~~LOC~~ IMPL
3.6000 mg | DRUG_IMPLANT | Freq: Once | SUBCUTANEOUS | Status: AC
  Administered 2017-09-24: 3.6 mg via SUBCUTANEOUS

## 2017-09-24 NOTE — Patient Instructions (Signed)
Goserelin injection What is this medicine? GOSERELIN (GOE se rel in) is similar to a hormone found in the body. It lowers the amount of sex hormones that the body makes. Men will have lower testosterone levels and women will have lower estrogen levels while taking this medicine. In men, this medicine is used to treat prostate cancer; the injection is either given once per month or once every 12 weeks. A once per month injection (only) is used to treat women with endometriosis, dysfunctional uterine bleeding, or advanced breast cancer. This medicine may be used for other purposes; ask your health care provider or pharmacist if you have questions. What should I tell my health care provider before I take this medicine? They need to know if you have any of these conditions (some only apply to women): -diabetes -heart disease or previous heart attack -high blood pressure -high cholesterol -kidney disease -osteoporosis or low bone density -problems passing urine -spinal cord injury -stroke -tobacco smoker -an unusual or allergic reaction to goserelin, hormone therapy, other medicines, foods, dyes, or preservatives -pregnant or trying to get pregnant -breast-feeding How should I use this medicine? This medicine is for injection under the skin. It is given by a health care professional in a hospital or clinic setting. Men receive this injection once every 4 weeks or once every 12 weeks. Women will only receive the once every 4 weeks injection. Talk to your pediatrician regarding the use of this medicine in children. Special care may be needed. Overdosage: If you think you have taken too much of this medicine contact a poison control center or emergency room at once. NOTE: This medicine is only for you. Do not share this medicine with others. What if I miss a dose? It is important not to miss your dose. Call your doctor or health care professional if you are unable to keep an appointment. What may  interact with this medicine? -female hormones like estrogen -herbal or dietary supplements like black cohosh, chasteberry, or DHEA -female hormones like testosterone -prasterone This list may not describe all possible interactions. Give your health care provider a list of all the medicines, herbs, non-prescription drugs, or dietary supplements you use. Also tell them if you smoke, drink alcohol, or use illegal drugs. Some items may interact with your medicine. What should I watch for while using this medicine? Visit your doctor or health care professional for regular checks on your progress. Your symptoms may appear to get worse during the first weeks of this therapy. Tell your doctor or healthcare professional if your symptoms do not start to get better or if they get worse after this time. Your bones may get weaker if you take this medicine for a long time. If you smoke or frequently drink alcohol you may increase your risk of bone loss. A family history of osteoporosis, chronic use of drugs for seizures (convulsions), or corticosteroids can also increase your risk of bone loss. Talk to your doctor about how to keep your bones strong. This medicine should stop regular monthly menstration in women. Tell your doctor if you continue to menstrate. Women should not become pregnant while taking this medicine or for 12 weeks after stopping this medicine. Women should inform their doctor if they wish to become pregnant or think they might be pregnant. There is a potential for serious side effects to an unborn child. Talk to your health care professional or pharmacist for more information. Do not breast-feed an infant while taking this medicine. Men should   inform their doctors if they wish to father a child. This medicine may lower sperm counts. Talk to your health care professional or pharmacist for more information. What side effects may I notice from receiving this medicine? Side effects that you should  report to your doctor or health care professional as soon as possible: -allergic reactions like skin rash, itching or hives, swelling of the face, lips, or tongue -bone pain -breathing problems -changes in vision -chest pain -feeling faint or lightheaded, falls -fever, chills -pain, swelling, warmth in the leg -pain, tingling, numbness in the hands or feet -signs and symptoms of low blood pressure like dizziness; feeling faint or lightheaded, falls; unusually weak or tired -stomach pain -swelling of the ankles, feet, hands -trouble passing urine or change in the amount of urine -unusually high or low blood pressure -unusually weak or tired Side effects that usually do not require medical attention (report to your doctor or health care professional if they continue or are bothersome): -change in sex drive or performance -changes in breast size in both males and females -changes in emotions or moods -headache -hot flashes -irritation at site where injected -loss of appetite -skin problems like acne, dry skin -vaginal dryness This list may not describe all possible side effects. Call your doctor for medical advice about side effects. You may report side effects to FDA at 1-800-FDA-1088. Where should I keep my medicine? This drug is given in a hospital or clinic and will not be stored at home. NOTE: This sheet is a summary. It may not cover all possible information. If you have questions about this medicine, talk to your doctor, pharmacist, or health care provider.    2016, Elsevier/Gold Standard. (2013-05-25 11:10:35)  

## 2017-09-25 ENCOUNTER — Other Ambulatory Visit: Payer: Self-pay | Admitting: Oncology

## 2017-10-21 ENCOUNTER — Other Ambulatory Visit: Payer: Self-pay

## 2017-10-21 DIAGNOSIS — Z171 Estrogen receptor negative status [ER-]: Secondary | ICD-10-CM

## 2017-10-21 DIAGNOSIS — C50412 Malignant neoplasm of upper-outer quadrant of left female breast: Secondary | ICD-10-CM

## 2017-10-22 ENCOUNTER — Inpatient Hospital Stay: Payer: Medicare Other

## 2017-10-22 ENCOUNTER — Inpatient Hospital Stay: Payer: Medicare Other | Attending: Oncology

## 2017-10-22 VITALS — BP 180/95 | HR 100 | Temp 98.2°F

## 2017-10-22 DIAGNOSIS — Z5111 Encounter for antineoplastic chemotherapy: Secondary | ICD-10-CM | POA: Diagnosis not present

## 2017-10-22 DIAGNOSIS — C50412 Malignant neoplasm of upper-outer quadrant of left female breast: Secondary | ICD-10-CM

## 2017-10-22 DIAGNOSIS — Z171 Estrogen receptor negative status [ER-]: Secondary | ICD-10-CM

## 2017-10-22 LAB — CBC WITH DIFFERENTIAL (CANCER CENTER ONLY)
Basophils Absolute: 0 10*3/uL (ref 0.0–0.1)
Basophils Relative: 0 %
Eosinophils Absolute: 0.1 10*3/uL (ref 0.0–0.5)
Eosinophils Relative: 2 %
HEMATOCRIT: 40.3 % (ref 34.8–46.6)
HEMOGLOBIN: 13.5 g/dL (ref 11.6–15.9)
Lymphocytes Relative: 34 %
Lymphs Abs: 2.3 10*3/uL (ref 0.9–3.3)
MCH: 29.2 pg (ref 25.1–34.0)
MCHC: 33.5 g/dL (ref 31.5–36.0)
MCV: 87 fL (ref 79.5–101.0)
MONO ABS: 0.5 10*3/uL (ref 0.1–0.9)
Monocytes Relative: 7 %
NEUTROS ABS: 3.8 10*3/uL (ref 1.5–6.5)
NEUTROS PCT: 57 %
Platelet Count: 216 10*3/uL (ref 145–400)
RBC: 4.63 MIL/uL (ref 3.70–5.45)
RDW: 13.8 % (ref 11.2–14.5)
WBC Count: 6.7 10*3/uL (ref 3.9–10.3)

## 2017-10-22 LAB — CMP (CANCER CENTER ONLY)
ALT: 29 U/L (ref 0–44)
AST: 22 U/L (ref 15–41)
Albumin: 3.7 g/dL (ref 3.5–5.0)
Alkaline Phosphatase: 78 U/L (ref 38–126)
Anion gap: 10 (ref 5–15)
BILIRUBIN TOTAL: 0.3 mg/dL (ref 0.3–1.2)
BUN: 11 mg/dL (ref 6–20)
CALCIUM: 9.2 mg/dL (ref 8.9–10.3)
CO2: 26 mmol/L (ref 22–32)
Chloride: 106 mmol/L (ref 98–111)
Creatinine: 0.92 mg/dL (ref 0.44–1.00)
GFR, Est AFR Am: >60 mL/min (ref >60)
GFR, Estimated: >60 mL/min (ref >60)
GLUCOSE: 100 mg/dL — AB (ref 70–99)
Potassium: 3.1 mmol/L — ABNORMAL LOW (ref 3.5–5.1)
SODIUM: 142 mmol/L (ref 135–145)
Total Protein: 7.5 g/dL (ref 6.5–8.1)

## 2017-10-22 MED ORDER — GOSERELIN ACETATE 3.6 MG ~~LOC~~ IMPL
3.6000 mg | DRUG_IMPLANT | Freq: Once | SUBCUTANEOUS | Status: AC
  Administered 2017-10-22: 3.6 mg via SUBCUTANEOUS

## 2017-10-22 NOTE — Patient Instructions (Signed)
Goserelin injection What is this medicine? GOSERELIN (GOE se rel in) is similar to a hormone found in the body. It lowers the amount of sex hormones that the body makes. Men will have lower testosterone levels and women will have lower estrogen levels while taking this medicine. In men, this medicine is used to treat prostate cancer; the injection is either given once per month or once every 12 weeks. A once per month injection (only) is used to treat women with endometriosis, dysfunctional uterine bleeding, or advanced breast cancer. This medicine may be used for other purposes; ask your health care provider or pharmacist if you have questions. What should I tell my health care provider before I take this medicine? They need to know if you have any of these conditions (some only apply to women): -diabetes -heart disease or previous heart attack -high blood pressure -high cholesterol -kidney disease -osteoporosis or low bone density -problems passing urine -spinal cord injury -stroke -tobacco smoker -an unusual or allergic reaction to goserelin, hormone therapy, other medicines, foods, dyes, or preservatives -pregnant or trying to get pregnant -breast-feeding How should I use this medicine? This medicine is for injection under the skin. It is given by a health care professional in a hospital or clinic setting. Men receive this injection once every 4 weeks or once every 12 weeks. Women will only receive the once every 4 weeks injection. Talk to your pediatrician regarding the use of this medicine in children. Special care may be needed. Overdosage: If you think you have taken too much of this medicine contact a poison control center or emergency room at once. NOTE: This medicine is only for you. Do not share this medicine with others. What if I miss a dose? It is important not to miss your dose. Call your doctor or health care professional if you are unable to keep an appointment. What may  interact with this medicine? -female hormones like estrogen -herbal or dietary supplements like black cohosh, chasteberry, or DHEA -female hormones like testosterone -prasterone This list may not describe all possible interactions. Give your health care provider a list of all the medicines, herbs, non-prescription drugs, or dietary supplements you use. Also tell them if you smoke, drink alcohol, or use illegal drugs. Some items may interact with your medicine. What should I watch for while using this medicine? Visit your doctor or health care professional for regular checks on your progress. Your symptoms may appear to get worse during the first weeks of this therapy. Tell your doctor or healthcare professional if your symptoms do not start to get better or if they get worse after this time. Your bones may get weaker if you take this medicine for a long time. If you smoke or frequently drink alcohol you may increase your risk of bone loss. A family history of osteoporosis, chronic use of drugs for seizures (convulsions), or corticosteroids can also increase your risk of bone loss. Talk to your doctor about how to keep your bones strong. This medicine should stop regular monthly menstration in women. Tell your doctor if you continue to menstrate. Women should not become pregnant while taking this medicine or for 12 weeks after stopping this medicine. Women should inform their doctor if they wish to become pregnant or think they might be pregnant. There is a potential for serious side effects to an unborn child. Talk to your health care professional or pharmacist for more information. Do not breast-feed an infant while taking this medicine. Men should   inform their doctors if they wish to father a child. This medicine may lower sperm counts. Talk to your health care professional or pharmacist for more information. What side effects may I notice from receiving this medicine? Side effects that you should  report to your doctor or health care professional as soon as possible: -allergic reactions like skin rash, itching or hives, swelling of the face, lips, or tongue -bone pain -breathing problems -changes in vision -chest pain -feeling faint or lightheaded, falls -fever, chills -pain, swelling, warmth in the leg -pain, tingling, numbness in the hands or feet -signs and symptoms of low blood pressure like dizziness; feeling faint or lightheaded, falls; unusually weak or tired -stomach pain -swelling of the ankles, feet, hands -trouble passing urine or change in the amount of urine -unusually high or low blood pressure -unusually weak or tired Side effects that usually do not require medical attention (report to your doctor or health care professional if they continue or are bothersome): -change in sex drive or performance -changes in breast size in both males and females -changes in emotions or moods -headache -hot flashes -irritation at site where injected -loss of appetite -skin problems like acne, dry skin -vaginal dryness This list may not describe all possible side effects. Call your doctor for medical advice about side effects. You may report side effects to FDA at 1-800-FDA-1088. Where should I keep my medicine? This drug is given in a hospital or clinic and will not be stored at home. NOTE: This sheet is a summary. It may not cover all possible information. If you have questions about this medicine, talk to your doctor, pharmacist, or health care provider.    2016, Elsevier/Gold Standard. (2013-05-25 11:10:35)  

## 2017-10-31 ENCOUNTER — Other Ambulatory Visit: Payer: Self-pay | Admitting: Internal Medicine

## 2017-10-31 ENCOUNTER — Other Ambulatory Visit: Payer: Self-pay | Admitting: Neurology

## 2017-11-03 ENCOUNTER — Other Ambulatory Visit: Payer: Self-pay | Admitting: Internal Medicine

## 2017-11-03 NOTE — Telephone Encounter (Signed)
Pt is due for an appt before we can send in refills. Can you call pt for an appt.

## 2017-11-04 NOTE — Telephone Encounter (Signed)
Tried to call patient to schedule. No answer and no voicemail.

## 2017-11-18 ENCOUNTER — Other Ambulatory Visit: Payer: Self-pay | Admitting: *Deleted

## 2017-11-18 DIAGNOSIS — C50412 Malignant neoplasm of upper-outer quadrant of left female breast: Secondary | ICD-10-CM

## 2017-11-18 DIAGNOSIS — Z171 Estrogen receptor negative status [ER-]: Secondary | ICD-10-CM

## 2017-11-18 NOTE — Progress Notes (Addendum)
ID: Erica [name]   DOB: 12-25-1975  MR#: [id number]  DTO#:[id number]  XIP:[id number], Erica Right, MD GYN: SU:  Erroll Luna, MD Other:  Erica Gully, MD;  Erica Jarred, MD;  Erica Dawley, MD, Erica Mutter MD, Erica Amber MD  CHIEF COMPLAINT:  Triple negative Breast Cancer  CURRENT TREATMENT: Observation  BREAST CANCER HISTORY: From the original intake note:  Erica [name] noticed a lump in her left breast sometime in August or September 2009.  Initially this was thought to possibly be "dry breast milk" but as the mass got bigger, she brought it to her physician's attention and was set up for mammography.  This was performed May 20, 2008 at [facility] and it showed a palpable 8.5 cm oval circumscribed mass in the left breast upper outer quadrant.  There were prominent left axillary lymph nodes noted as well.  An ultrasound showed this to be hypoechoic, with lobulated margins and probable central necrosis measuring up to 7.3 cm on this modality.  Ultrasound of the left axilla demonstrated numerous prominent lymph nodes at the upper limit of normal, the largest measuring 1 cm with a normal appearing thin cortex and a normal cortex to halo ratio.    Because the patient had no insurance, she was referred to [facility] and biopsy was performed in [place] with the pathology there ([id number]) showing a poorly differentiated malignancy which was triple negative (ER 1%, PR 0%, HER-2/neu 0).  The axillary lymph nodes were not biopsied. With this information, the patient was presented at the Breast Multidisciplinary Conference and the feeling was that, after appropriate staging, the patient would be a good candidate for neoadjuvant treatment and possibly NSABP B40.    Her subsequent history is as detailed below  INTERVAL HISTORY: Erica [name] returns today for follow-up of her estrogen receptor negative breast cancer. She continues under observation. She receives goserelin every 28 days, with a dose due today.  She tolerates this well without any complications.   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 She has a peritoneal shunt in place due to her hypertension. She follows up with Dr. 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 She was referred to a blindness center, to learn how to cope with loss of eye sight.  Dr. Posey Pronto is trying to protect her left eye   REVIEW OF SYSTEMS: Erica [name] reports that she lives in [place] and [place] most times. She stays with a friend in [place], when she has doctor's visits. She stays with her mom and daughters in [place]. She has headaches everyday and fluid in her ears. It takes her a long time to focus her eyes in the morning. She notes that her BP is high, but she is taking 8 different medications that increase her BP. She gets SOB easily and she is not able to exercise. She denies nausea, vomiting, or dizziness. There has been no unusual cough, phlegm production, or pleurisy. There has been no change in bowel or bladder habits. She denies unexplained fatigue or unexplained weight loss, bleeding, rash, or fever. A detailed review of systems was otherwise stable.   PAST MEDICAL HISTORY: Past Medical History:  Diagnosis Date  . Asthma   . Breast cancer ([place]) 2010   a. locally advanced left breast carcinoma diagnosed in 2010 and treated with neoadjuvant chemotherapy with docetaxel and cyclophosphamide as well as paclitaxel. This was followed by radiation therapy which was completed in  2011.  . Dyspnea on exertion    a. 01/2013 Lexi MV: EF 54%, no ischemia/infarct;  b. 05/2013 Echo: EF 60-65%, no rwma, Gr 2 DD;  b.   . Fatty liver   . Gastroparesis   . GERD (gastroesophageal reflux disease)   . Hypertension   . Impaired glucose tolerance 04/13/2011  . Lymphadenitis, chronic    restricted LEFT extremity  . 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 OSA (obstructive sleep  apnea) 05/04/2014   AHI 31/hr now on CPAP at 12cm H2O  . Personal history of chemotherapy 2010  . Personal history of radiation therapy 2010    PAST SURGICAL HISTORY: Past Surgical History:  Procedure Laterality Date  . BRAIN SURGERY     Shunt  . BREAST LUMPECTOMY Left 09/2008  . CARPAL TUNNEL RELEASE Left 2011  . LYMPH NODE DISSECTION Left 2010   breast; 2 wks after breast lumpectomy  . TENDON RELEASE [name] 2012   "hand"  . TUBAL LIGATION  05/11/2011   Procedure: POST PARTUM TUBAL LIGATION;  Surgeon: Emeterio Reeve, MD;  Location: [facility];  Service: Gynecology;  Laterality: Bilateral;  Induced for HTN    FAMILY HISTORY Family History  Problem Relation Age of Onset  . Other Mother 89       breast calcifications treated with surgery, breast cancer pill, and radiation  . Fibroids Sister 42       s/p TAH-BSO  . Diabetes Maternal Grandmother   . Heart disease Maternal Grandmother   . Hypertension Maternal Grandmother   . Breast cancer Other        maternal great aunt (MGM's sister)  . Breast cancer Other        paternal great aunt dx middle ages; s/p mastectomy  . Cancer Neg Hx   . Alcohol abuse Neg Hx   . Early death Neg Hx   . Hyperlipidemia Neg Hx   . Kidney disease Neg Hx   . Stroke Neg Hx   . Colon cancer Neg Hx   The patient's parents are alive. The patient has one brother and one sister.  There is no ovarian or breast cancer in the immediate family, but the patient's maternal grandmother, who was one of 29 sisters, had one sister with breast cancer diagnosed in her 70.    GYNECOLOGIC HISTORY: updated January 2015 The patient is GX P4, first pregnancy to term at age 14.  She stopped having periods at the time of chemotherapy, and had had no periods for  2 years, then became pregnant in 2012. This was a surprise to her, and she was not aware of the pregnancy until the seventh month. She has not resumed normal menses, but has had some "spotting" on and off for the last  several months.  SOCIAL HISTORY: updated January 2015  She has been a Dance movement psychotherapist in the past. She's currently disabled. she splits her time between [place] and [place], [place]. She's currently single. Erica [name] has four children,  currently 39 year old, 13 years old, 58 and 44 years old. Her oldest daughter is attending college in [place].   Her youngest son is almost 2. 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 Smoking status: Never Smoker  . Smokeless tobacco: Never Used  Substance Use Topics  . Alcohol use: Yes    Alcohol/week: 0.0 standard drinks  Comment: occasionally/socially  . Drug use: No     Colonoscopy: Never  PAP: June 2013  Bone density:  Never  Lipid panel: Nov 2014, Dr. Meda Coffee   Allergies  Allergen Reactions  . Aspirin Shortness Of Breath and Palpitations    Pt can take ibuprofen without reaction  . Doxycycline Anaphylaxis  . Kiwi Extract Itching and Swelling    Lip swelling  . Penicillins Shortness Of Breath and Palpitations    Has patient had a PCN reaction causing immediate rash, facial/tongue/throat swelling, SOB or lightheadedness with hypotension: Yes Has patient had a PCN reaction causing severe rash involving mucus membranes or skin necrosis: Yes Has patient had a PCN reaction that required hospitalization Yes Has patient had a PCN reaction occurring within the last 10 years: No If all of the above answers are "NO", then may proceed with Cephalosporin use.  . Strawberry Extract Hives  . Sulfamethoxazole-Trimethoprim Anaphylaxis    Facial, throat and tongue swelling with difficulty swallowing.    Current Outpatient Medications  Medication Sig Dispense Refill  . acetaminophen-codeine (TYLENOL #4) 300-60 MG tablet TAKE 1 TABLET EVERY 4 HOURS AS NEEDED 30 tablet 0  . acetaZOLAMIDE (DIAMOX) 250 MG tablet TAKE 4 TABLETS (1,000 MG TOTAL) BY MOUTH  3 (THREE) TIMES DAILY. 360 tablet 1  . albuterol (PROVENTIL) (2.5 MG/3ML) 0.083% nebulizer solution USE 1 VIAL VIA NEBULIZER EVERY 4 HOURS AND AS NEEDED FOR WHEEZING OR SHORTNESS OF BREATH 75 mL 1  . amitriptyline (ELAVIL) 100 MG tablet Take 100 mg by mouth at bedtime.    Marland Kitchen amLODipine (NORVASC) 10 MG tablet Take 1 tablet (10 mg total) by mouth daily. 90 tablet 0  . Biotin 10000 MCG TABS Take 1 tablet by mouth daily.    . budesonide-formoterol (SYMBICORT) 160-4.5 MCG/ACT inhaler INHALE 2 PUFFS INTO LUNGS 2 TIMES DAILY AS DIRECTED    . carvedilol (COREG) 6.25 MG tablet Take 1 tablet (6.25 mg total) by mouth 2 (two) times daily. 180 tablet 1  . chlorpheniramine (CHLOR-TRIMETON) 4 MG tablet 2 tabs by mouth at bedtime.  May take 1 extra every 4 hours as needed    . chlorthalidone (HYGROTON) 25 MG tablet TAKE 1 TABLET (25 MG TOTAL) DAILY BY MOUTH. 90 tablet 0  . cloNIDine (CATAPRES) 0.3 MG tablet TAKE 1 TABLET BY MOUTH TWICE A DAY 180 tablet 3  . dexlansoprazole (DEXILANT) 60 MG capsule Take 1 capsule (60 mg total) by mouth daily. 90 capsule 3  . dextromethorphan-guaiFENesin (MUCINEX DM) 30-600 MG 12hr tablet Take 1 tablet by mouth 2 (two) times daily.     Marland Kitchen dicyclomine (BENTYL) 20 MG tablet TAKE 1 TABLET BY MOUTH 3 TIMES DAILY AS NEEDED FOR SPASMS 90 tablet 1  . diltiazem (CARDIZEM LA) 420 MG 24 hr tablet Take 1 tablet (420 mg total) by mouth daily. 90 tablet 0  . DULoxetine (CYMBALTA) 30 MG capsule Take 1 capsule (30 mg total) by mouth daily. With 18m for total of 90 mg 30 capsule 1  . DULoxetine (CYMBALTA) 60 MG capsule Take 1 capsule (60 mg total) by mouth daily. For total of 90 mg 30 capsule 1  . EPINEPHRINE 0.3 mg/0.3 mL IJ SOAJ injection INJECT 0.3 MLS INTO THE MUSCLE ONCE AS NEEDED FOR ANAPHYLAXIS 2 Device 2  . furosemide (LASIX) 40 MG tablet Take 1 tablet (40 mg total) by mouth 2 (two) times daily. 180 tablet 1  . hydrALAZINE (APRESOLINE) 100 MG tablet TAKE 1 TABLET (100 MG TOTAL) BY MOUTH 3  (THREE)  TIMES DAILY. 270 tablet 2  . hydrALAZINE (APRESOLINE) 25 MG tablet TAKE 1 TABLET BY MOUTH 3 (THREE) TIMES A DAY 270 tablet 0  . lidocaine-prilocaine (EMLA) cream Apply 1 application topically. Apply 1 application to skin ever 28 days    . lisinopril (PRINIVIL,ZESTRIL) 10 MG tablet Take 1 tablet (10 mg total) by mouth daily. 90 tablet 3  . LORazepam (ATIVAN) 0.5 MG tablet Take 0.5 mg by mouth every 8 (eight) hours.    . metoCLOPramide (REGLAN) 10 MG tablet TAKE 1 TABLET BY MOUTH 4 TIMES A DAY WITH MEALS AND AT BEDTIME 360 tablet 3  . metoprolol tartrate (LOPRESSOR) 50 MG tablet TAKE 1 TABLET (50 MG TOTAL) BY MOUTH ONCE FOR 1 DOSE. TAKE THIS 1 HOUR PRIOR TO CORONARY CT. 1 tablet 0  . Mometasone Furoate (ASMANEX HFA) 200 MCG/ACT AERO Inhale 2 puffs into the lungs 2 (two) times daily. 4 Inhaler 0  . montelukast (SINGULAIR) 10 MG tablet TAKE 1 TABLET BY MOUTH ONCE DAILY AT BEDTIME 30 tablet 5  . NITROSTAT 0.4 MG SL tablet PLACE 1 TABLET UNDER TONGUE EVERY 5 MINUTES AS NEEDED FOR CHEST PAIN, MAX OF 3 TABLETS THEN CALL 911 IF PAIN PERSISTS 100 tablet 0  . OXYGEN Use CPAP at bedtime    . oxymetazoline (MUCINEX NASAL SPRAY FULL FORCE) 0.05 % nasal spray Place 1 spray into both nostrils 2 (two) times daily.    . potassium chloride SA (K-DUR,KLOR-CON) 20 MEQ tablet Take 20 mEq by mouth 2 (two) times daily.    . predniSONE (DELTASONE) 10 MG tablet Take  4 each am x 2 days,   2 each am x 2 days,  1 each am x 2 days and stop 14 tablet 0  . primidone (MYSOLINE) 50 MG tablet TAKE 0.5 TABLETS (25 MG TOTAL) BY MOUTH AT BEDTIME. 45 tablet 1  . promethazine (PHENERGAN) 25 MG tablet Take 1 tablet (25 mg total) by mouth 3 (three) times daily as needed for nausea or vomiting. 270 tablet 1  . RANEXA 500 MG 12 hr tablet TAKE 1 TABLET BY MOUTH TWICE A DAY 180 tablet 1  . ranitidine (ZANTAC) 150 MG tablet Take 1 tablet (150 mg total) by mouth 2 (two) times daily. 180 tablet 3  . Respiratory Therapy Supplies (FLUTTER)  DEVI Use as directed 1 each 0  . spironolactone (ALDACTONE) 100 MG tablet Take 1 tablet (100 mg total) by mouth daily. 90 tablet 1  . SYMBICORT 160-4.5 MCG/ACT inhaler INHALE 2 PUFFS INTO LUNGS 2 TIMES DAILY AS DIRECTED 10.2 Inhaler 5  . tapentadol HCl (NUCYNTA) 75 MG tablet Take 1 tablet (75 mg total) every 6 (six) hours as needed by mouth. 120 tablet 0  . topiramate (TOPAMAX) 25 MG tablet Take 25 mg by mouth 2 (two) times daily.    . traZODone (DESYREL) 150 MG tablet TAKE 1 TABLET BY MOUTH AT BEDTIME 90 tablet 1  . VENTOLIN HFA 108 (90 Base) MCG/ACT inhaler INHALE 2 PUFFS BY MOUTH EVERY 4 HOURS AS NEEDED FOR WHEEZE OR FOR SHORTNESS OF BREATH 18 Inhaler 1   No current facility-administered medications for this visit.     OBJECTIVE:  Young African-American woman in no acute distress  Vitals:   11/19/17 1058  BP: (!) 193/101  Pulse: (!) 102  Resp: 18  Temp: 99.5 F (37.5 C)  SpO2: 100%     Body mass index is 31.33 kg/m.    ECOG FS: 2 Filed Weights   11/19/17 1058  Weight:  194 lb 1.6 oz (88 kg)   Sclerae unicteric, EOMs intact No cervical or supraclavicular adenopathy Lungs no rales or rhonchi Heart regular rate and rhythm Abd soft, nontender, positive bowel sounds MSK no focal spinal tenderness, no upper extremity lymphedema Neuro: nonfocal, well oriented, appropriate affect Breasts: Breast is unremarkable.  The left breast is status post lumpectomy and radiation.  There is no evidence of local recurrence.  Both axillae are benign.  LAB RESULTS: Lab Results  Component Value Date   WBC 5.9 11/19/2017   NEUTROABS 3.6 11/19/2017   HGB 14.6 11/19/2017   HCT 43.9 11/19/2017   MCV 88.0 11/19/2017   PLT 192 11/19/2017      Chemistry      Component Value Date/Time   NA 142 10/22/2017 1021   NA 143 04/17/2017 1120   NA 142 03/17/2017 0835   K 3.1 (L) 10/22/2017 1021   K 3.6 03/17/2017 0835   CL 106 10/22/2017 1021   CL 103 09/10/2012 1618   CO2 26 10/22/2017 1021   CO2  27 03/17/2017 0835   BUN 11 10/22/2017 1021   BUN 12 04/17/2017 1120   BUN 10.1 03/17/2017 0835   CREATININE 0.92 10/22/2017 1021   CREATININE 0.9 03/17/2017 0835      Component Value Date/Time   CALCIUM 9.2 10/22/2017 1021   CALCIUM 9.5 03/17/2017 0835   ALKPHOS 78 10/22/2017 1021   ALKPHOS 77 03/17/2017 0835   AST 22 10/22/2017 1021   AST 17 03/17/2017 0835   ALT 29 10/22/2017 1021   ALT 24 03/17/2017 0835   BILITOT 0.3 10/22/2017 1021   BILITOT 0.50 03/17/2017 0835        STUDIES: She was due to mammography August [facility]--this is being reordered.   ASSESSMENT: 42 y.o. [place] woman with history of locally advanced left breast carcinoma initially presenting in March 2010   (1) treated neoadjuvantly with 4 cycles of docetaxel/ cyclophosphamide.    (2) status post left lumpectomy and sentinel lymph node dissection in July 2010 for a ypT2 ypN1, stage IIB invasive ductal carcinoma, grade 3, triple negative, with MIB-1 of 88%.   (3) status post full axillary dissection and margin clearance in August 2010.   (4) status post 11 doses of weekly paclitaxel completed in December 2010   (5) radiation therapy completed in March 2011.  Now followed with observation alone.   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  This is very favorable.  She is very concerned that she is going to lose her vision.  She is making arrangements so that when that happens she will be able to function as well as possible.  Her blood pressure is still not well controlled.  Cardiology is working on this as hard as possible and there are many  drug interactions that are getting in the way of good control.  Erica [name] is doing the best she can.  As far as her ventricular shunt she says she hates it and she wonders if that is the reason she is losing her eyesight.  I explained that it really is the blood pressure problem and that the shunt should be helping and terms of the intracranial  blood pressure problem  [facility].  I have moved her appointment with me next year to September so that she can have her August mammogram prior to that visit.  That will be her 10-year anniversary and we will consider releasing her from follow-up at that point  She is interested in undergoing hysterectomy and bilateral salpingo-oophorectomy.  That would obviate the need for her to continue to receive her goserelin monthly.  She knows to call for any other issues that may develop before the next visit.  Magrinat, Virgie Dad, MD  11/19/17 11:28 AM Medical Oncology and Hematology [facility] [address] [place], [place] [address] Tel. [phone]    Fax. [phone]  Alice Rieger, am acting as scribe for Chauncey Cruel MD.  I, Lurline Del MD, have reviewed the above documentation for accuracy and completeness, and I agree with the above.

## 2017-11-19 ENCOUNTER — Inpatient Hospital Stay (HOSPITAL_BASED_OUTPATIENT_CLINIC_OR_DEPARTMENT_OTHER): Payer: Medicare Other | Admitting: Oncology

## 2017-11-19 ENCOUNTER — Telehealth: Payer: Self-pay | Admitting: Oncology

## 2017-11-19 ENCOUNTER — Encounter: Payer: Self-pay | Admitting: Oncology

## 2017-11-19 ENCOUNTER — Inpatient Hospital Stay: Payer: Medicare Other | Attending: Oncology

## 2017-11-19 ENCOUNTER — Other Ambulatory Visit: Payer: Self-pay | Admitting: Oncology

## 2017-11-19 ENCOUNTER — Inpatient Hospital Stay: Payer: Medicare Other

## 2017-11-19 VITALS — BP 193/101 | HR 102 | Temp 99.5°F | Resp 18 | Ht 66.0 in | Wt 194.1 lb

## 2017-11-19 DIAGNOSIS — C50412 Malignant neoplasm of upper-outer quadrant of left female breast: Secondary | ICD-10-CM | POA: Insufficient documentation

## 2017-11-19 DIAGNOSIS — I10 Essential (primary) hypertension: Secondary | ICD-10-CM | POA: Diagnosis not present

## 2017-11-19 DIAGNOSIS — Z171 Estrogen receptor negative status [ER-]: Secondary | ICD-10-CM

## 2017-11-19 DIAGNOSIS — G932 Benign intracranial hypertension: Secondary | ICD-10-CM | POA: Diagnosis not present

## 2017-11-19 DIAGNOSIS — Z5111 Encounter for antineoplastic chemotherapy: Secondary | ICD-10-CM | POA: Insufficient documentation

## 2017-11-19 DIAGNOSIS — Z1231 Encounter for screening mammogram for malignant neoplasm of breast: Secondary | ICD-10-CM

## 2017-11-19 LAB — CMP (CANCER CENTER ONLY)
ALBUMIN: 3.7 g/dL (ref 3.5–5.0)
ALK PHOS: 82 U/L (ref 38–126)
ALT: 35 U/L (ref 0–44)
ANION GAP: 8 (ref 5–15)
AST: 24 U/L (ref 15–41)
BUN: 12 mg/dL (ref 6–20)
CHLORIDE: 104 mmol/L (ref 98–111)
CO2: 31 mmol/L (ref 22–32)
CREATININE: 1.03 mg/dL — AB (ref 0.44–1.00)
Calcium: 9.5 mg/dL (ref 8.9–10.3)
GFR, Est AFR Am: >60 mL/min (ref >60)
GFR, Estimated: >60 mL/min (ref >60)
GLUCOSE: 106 mg/dL — AB (ref 70–99)
Potassium: 3.3 mmol/L — ABNORMAL LOW (ref 3.5–5.1)
SODIUM: 143 mmol/L (ref 135–145)
Total Bilirubin: 0.5 mg/dL (ref 0.3–1.2)
Total Protein: 7.9 g/dL (ref 6.5–8.1)

## 2017-11-19 LAB — CBC WITH DIFFERENTIAL (CANCER CENTER ONLY)
BASOS PCT: 1 %
Basophils Absolute: 0 10*3/uL (ref 0.0–0.1)
Eosinophils Absolute: 0.1 10*3/uL (ref 0.0–0.5)
Eosinophils Relative: 1 %
HEMATOCRIT: 43.9 % (ref 34.8–46.6)
HEMOGLOBIN: 14.6 g/dL (ref 11.6–15.9)
LYMPHS ABS: 1.8 10*3/uL (ref 0.9–3.3)
Lymphocytes Relative: 30 %
MCH: 29.2 pg (ref 25.1–34.0)
MCHC: 33.2 g/dL (ref 31.5–36.0)
MCV: 88 fL (ref 79.5–101.0)
MONO ABS: 0.4 10*3/uL (ref 0.1–0.9)
MONOS PCT: 7 %
NEUTROS ABS: 3.6 10*3/uL (ref 1.5–6.5)
Neutrophils Relative %: 61 %
Platelet Count: 192 10*3/uL (ref 145–400)
RBC: 4.99 MIL/uL (ref 3.70–5.45)
RDW: 15.1 % — AB (ref 11.2–14.5)
WBC Count: 5.9 10*3/uL (ref 3.9–10.3)

## 2017-11-19 MED ORDER — GOSERELIN ACETATE 3.6 MG ~~LOC~~ IMPL
3.6000 mg | DRUG_IMPLANT | SUBCUTANEOUS | Status: DC
  Administered 2017-11-19: 3.6 mg via SUBCUTANEOUS

## 2017-11-19 MED ORDER — GOSERELIN ACETATE 3.6 MG ~~LOC~~ IMPL
DRUG_IMPLANT | SUBCUTANEOUS | Status: AC
  Filled 2017-11-19: qty 3.6

## 2017-11-19 NOTE — Addendum Note (Signed)
Addended by: Chauncey Cruel on: 11/19/2017 11:37 AM   Modules accepted: Orders

## 2017-11-19 NOTE — Telephone Encounter (Signed)
Gave patient AVS and Sept Calendar.  Per GM, make appts for injections for one year every 4 weeks.

## 2017-11-19 NOTE — Patient Instructions (Signed)
Goserelin injection What is this medicine? GOSERELIN (GOE se rel in) is similar to a hormone found in the body. It lowers the amount of sex hormones that the body makes. Men will have lower testosterone levels and women will have lower estrogen levels while taking this medicine. In men, this medicine is used to treat prostate cancer; the injection is either given once per month or once every 12 weeks. A once per month injection (only) is used to treat women with endometriosis, dysfunctional uterine bleeding, or advanced breast cancer. This medicine may be used for other purposes; ask your health care provider or pharmacist if you have questions. COMMON BRAND NAME(S): Zoladex What should I tell my health care provider before I take this medicine? They need to know if you have any of these conditions (some only apply to women): -diabetes -heart disease or previous heart attack -high blood pressure -high cholesterol -kidney disease -osteoporosis or low bone density -problems passing urine -spinal cord injury -stroke -tobacco smoker -an unusual or allergic reaction to goserelin, hormone therapy, other medicines, foods, dyes, or preservatives -pregnant or trying to get pregnant -breast-feeding How should I use this medicine? This medicine is for injection under the skin. It is given by a health care professional in a hospital or clinic setting. Men receive this injection once every 4 weeks or once every 12 weeks. Women will only receive the once every 4 weeks injection. Talk to your pediatrician regarding the use of this medicine in children. Special care may be needed. Overdosage: If you think you have taken too much of this medicine contact a poison control center or emergency room at once. NOTE: This medicine is only for you. Do not share this medicine with others. What if I miss a dose? It is important not to miss your dose. Call your doctor or health care professional if you are unable to  keep an appointment. What may interact with this medicine? -female hormones like estrogen -herbal or dietary supplements like black cohosh, chasteberry, or DHEA -female hormones like testosterone -prasterone This list may not describe all possible interactions. Give your health care provider a list of all the medicines, herbs, non-prescription drugs, or dietary supplements you use. Also tell them if you smoke, drink alcohol, or use illegal drugs. Some items may interact with your medicine. What should I watch for while using this medicine? Visit your doctor or health care professional for regular checks on your progress. Your symptoms may appear to get worse during the first weeks of this therapy. Tell your doctor or healthcare professional if your symptoms do not start to get better or if they get worse after this time. Your bones may get weaker if you take this medicine for a long time. If you smoke or frequently drink alcohol you may increase your risk of bone loss. A family history of osteoporosis, chronic use of drugs for seizures (convulsions), or corticosteroids can also increase your risk of bone loss. Talk to your doctor about how to keep your bones strong. This medicine should stop regular monthly menstration in women. Tell your doctor if you continue to menstrate. Women should not become pregnant while taking this medicine or for 12 weeks after stopping this medicine. Women should inform their doctor if they wish to become pregnant or think they might be pregnant. There is a potential for serious side effects to an unborn child. Talk to your health care professional or pharmacist for more information. Do not breast-feed an infant while taking   this medicine. Men should inform their doctors if they wish to father a child. This medicine may lower sperm counts. Talk to your health care professional or pharmacist for more information. What side effects may I notice from receiving this  medicine? Side effects that you should report to your doctor or health care professional as soon as possible: -allergic reactions like skin rash, itching or hives, swelling of the face, lips, or tongue -bone pain -breathing problems -changes in vision -chest pain -feeling faint or lightheaded, falls -fever, chills -pain, swelling, warmth in the leg -pain, tingling, numbness in the hands or feet -signs and symptoms of low blood pressure like dizziness; feeling faint or lightheaded, falls; unusually weak or tired -stomach pain -swelling of the ankles, feet, hands -trouble passing urine or change in the amount of urine -unusually high or low blood pressure -unusually weak or tired Side effects that usually do not require medical attention (report to your doctor or health care professional if they continue or are bothersome): -change in sex drive or performance -changes in breast size in both males and females -changes in emotions or moods -headache -hot flashes -irritation at site where injected -loss of appetite -skin problems like acne, dry skin -vaginal dryness This list may not describe all possible side effects. Call your doctor for medical advice about side effects. You may report side effects to FDA at 1-800-FDA-1088. Where should I keep my medicine? This drug is given in a hospital or clinic and will not be stored at home. NOTE: This sheet is a summary. It may not cover all possible information. If you have questions about this medicine, talk to your doctor, pharmacist, or health care provider.  2018 Elsevier/Gold Standard (2013-05-25 11:10:35)  

## 2017-11-20 ENCOUNTER — Ambulatory Visit
Admission: RE | Admit: 2017-11-20 | Discharge: 2017-11-20 | Disposition: A | Discharge status: Home / Self Care | Payer: Medicare Other | Source: Ambulatory Visit | Attending: Oncology | Admitting: Oncology

## 2017-11-20 DIAGNOSIS — Z1231 Encounter for screening mammogram for malignant neoplasm of breast: Secondary | ICD-10-CM

## 2017-11-22 ENCOUNTER — Other Ambulatory Visit: Payer: Self-pay | Admitting: Neurology

## 2017-11-25 ENCOUNTER — Other Ambulatory Visit: Payer: Self-pay | Admitting: Neurology

## 2017-11-27 ENCOUNTER — Other Ambulatory Visit: Payer: Medicare Other

## 2017-11-27 ENCOUNTER — Ambulatory Visit: Payer: Medicare Other | Admitting: Oncology

## 2017-12-17 ENCOUNTER — Inpatient Hospital Stay: Payer: Medicare Other | Attending: Oncology

## 2017-12-17 DIAGNOSIS — Z5111 Encounter for antineoplastic chemotherapy: Secondary | ICD-10-CM | POA: Insufficient documentation

## 2017-12-17 DIAGNOSIS — C50412 Malignant neoplasm of upper-outer quadrant of left female breast: Secondary | ICD-10-CM | POA: Diagnosis not present

## 2017-12-17 MED ORDER — GOSERELIN ACETATE 3.6 MG ~~LOC~~ IMPL
3.6000 mg | DRUG_IMPLANT | SUBCUTANEOUS | Status: DC
  Administered 2017-12-17: 3.6 mg via SUBCUTANEOUS

## 2017-12-17 MED ORDER — GOSERELIN ACETATE 3.6 MG ~~LOC~~ IMPL
DRUG_IMPLANT | SUBCUTANEOUS | Status: AC
  Filled 2017-12-17: qty 3.6

## 2017-12-27 ENCOUNTER — Other Ambulatory Visit: Payer: Self-pay | Admitting: Neurology

## 2017-12-28 ENCOUNTER — Other Ambulatory Visit: Payer: Self-pay | Admitting: Neurology

## 2018-01-14 ENCOUNTER — Inpatient Hospital Stay: Payer: Medicare HMO | Attending: Oncology

## 2018-01-14 DIAGNOSIS — Z5111 Encounter for antineoplastic chemotherapy: Secondary | ICD-10-CM | POA: Diagnosis not present

## 2018-01-14 DIAGNOSIS — C50412 Malignant neoplasm of upper-outer quadrant of left female breast: Secondary | ICD-10-CM | POA: Diagnosis not present

## 2018-01-14 MED ORDER — GOSERELIN ACETATE 3.6 MG ~~LOC~~ IMPL
DRUG_IMPLANT | SUBCUTANEOUS | Status: AC
  Filled 2018-01-14: qty 3.6

## 2018-01-14 MED ORDER — GOSERELIN ACETATE 3.6 MG ~~LOC~~ IMPL
3.6000 mg | DRUG_IMPLANT | SUBCUTANEOUS | Status: DC
  Administered 2018-01-14: 3.6 mg via SUBCUTANEOUS

## 2018-01-22 ENCOUNTER — Other Ambulatory Visit: Payer: Self-pay | Admitting: Internal Medicine

## 2018-01-22 ENCOUNTER — Other Ambulatory Visit: Payer: Self-pay | Admitting: Cardiology

## 2018-01-22 DIAGNOSIS — I119 Hypertensive heart disease without heart failure: Secondary | ICD-10-CM

## 2018-01-22 DIAGNOSIS — R072 Precordial pain: Secondary | ICD-10-CM

## 2018-01-22 DIAGNOSIS — I5032 Chronic diastolic (congestive) heart failure: Secondary | ICD-10-CM

## 2018-01-22 DIAGNOSIS — I10 Essential (primary) hypertension: Secondary | ICD-10-CM

## 2018-02-11 ENCOUNTER — Inpatient Hospital Stay: Payer: Medicare HMO | Attending: Oncology

## 2018-02-11 VITALS — BP 201/130 | HR 102 | Temp 98.5°F | Resp 17

## 2018-02-11 DIAGNOSIS — C50412 Malignant neoplasm of upper-outer quadrant of left female breast: Secondary | ICD-10-CM | POA: Diagnosis not present

## 2018-02-11 DIAGNOSIS — Z5111 Encounter for antineoplastic chemotherapy: Secondary | ICD-10-CM | POA: Insufficient documentation

## 2018-02-11 MED ORDER — GOSERELIN ACETATE 3.6 MG ~~LOC~~ IMPL
3.6000 mg | DRUG_IMPLANT | SUBCUTANEOUS | Status: DC
  Administered 2018-02-11: 3.6 mg via SUBCUTANEOUS

## 2018-02-11 NOTE — Patient Instructions (Signed)
Goserelin injection What is this medicine? GOSERELIN (GOE se rel in) is similar to a hormone found in the body. It lowers the amount of sex hormones that the body makes. Men will have lower testosterone levels and women will have lower estrogen levels while taking this medicine. In men, this medicine is used to treat prostate cancer; the injection is either given once per month or once every 12 weeks. A once per month injection (only) is used to treat women with endometriosis, dysfunctional uterine bleeding, or advanced breast cancer. This medicine may be used for other purposes; ask your health care provider or pharmacist if you have questions. COMMON BRAND NAME(S): Zoladex What should I tell my health care provider before I take this medicine? They need to know if you have any of these conditions (some only apply to women): -diabetes -heart disease or previous heart attack -high blood pressure -high cholesterol -kidney disease -osteoporosis or low bone density -problems passing urine -spinal cord injury -stroke -tobacco smoker -an unusual or allergic reaction to goserelin, hormone therapy, other medicines, foods, dyes, or preservatives -pregnant or trying to get pregnant -breast-feeding How should I use this medicine? This medicine is for injection under the skin. It is given by a health care professional in a hospital or clinic setting. Men receive this injection once every 4 weeks or once every 12 weeks. Women will only receive the once every 4 weeks injection. Talk to your pediatrician regarding the use of this medicine in children. Special care may be needed. Overdosage: If you think you have taken too much of this medicine contact a poison control center or emergency room at once. NOTE: This medicine is only for you. Do not share this medicine with others. What if I miss a dose? It is important not to miss your dose. Call your doctor or health care professional if you are unable to  keep an appointment. What may interact with this medicine? -female hormones like estrogen -herbal or dietary supplements like black cohosh, chasteberry, or DHEA -female hormones like testosterone -prasterone This list may not describe all possible interactions. Give your health care provider a list of all the medicines, herbs, non-prescription drugs, or dietary supplements you use. Also tell them if you smoke, drink alcohol, or use illegal drugs. Some items may interact with your medicine. What should I watch for while using this medicine? Visit your doctor or health care professional for regular checks on your progress. Your symptoms may appear to get worse during the first weeks of this therapy. Tell your doctor or healthcare professional if your symptoms do not start to get better or if they get worse after this time. Your bones may get weaker if you take this medicine for a long time. If you smoke or frequently drink alcohol you may increase your risk of bone loss. A family history of osteoporosis, chronic use of drugs for seizures (convulsions), or corticosteroids can also increase your risk of bone loss. Talk to your doctor about how to keep your bones strong. This medicine should stop regular monthly menstration in women. Tell your doctor if you continue to menstrate. Women should not become pregnant while taking this medicine or for 12 weeks after stopping this medicine. Women should inform their doctor if they wish to become pregnant or think they might be pregnant. There is a potential for serious side effects to an unborn child. Talk to your health care professional or pharmacist for more information. Do not breast-feed an infant while taking   this medicine. Men should inform their doctors if they wish to father a child. This medicine may lower sperm counts. Talk to your health care professional or pharmacist for more information. What side effects may I notice from receiving this  medicine? Side effects that you should report to your doctor or health care professional as soon as possible: -allergic reactions like skin rash, itching or hives, swelling of the face, lips, or tongue -bone pain -breathing problems -changes in vision -chest pain -feeling faint or lightheaded, falls -fever, chills -pain, swelling, warmth in the leg -pain, tingling, numbness in the hands or feet -signs and symptoms of low blood pressure like dizziness; feeling faint or lightheaded, falls; unusually weak or tired -stomach pain -swelling of the ankles, feet, hands -trouble passing urine or change in the amount of urine -unusually high or low blood pressure -unusually weak or tired Side effects that usually do not require medical attention (report to your doctor or health care professional if they continue or are bothersome): -change in sex drive or performance -changes in breast size in both males and females -changes in emotions or moods -headache -hot flashes -irritation at site where injected -loss of appetite -skin problems like acne, dry skin -vaginal dryness This list may not describe all possible side effects. Call your doctor for medical advice about side effects. You may report side effects to FDA at 1-800-FDA-1088. Where should I keep my medicine? This drug is given in a hospital or clinic and will not be stored at home. NOTE: This sheet is a summary. It may not cover all possible information. If you have questions about this medicine, talk to your doctor, pharmacist, or health care provider.  2018 Elsevier/Gold Standard (2013-05-25 11:10:35)  

## 2018-03-05 ENCOUNTER — Telehealth: Payer: Self-pay

## 2018-03-05 NOTE — Telephone Encounter (Signed)
Tried calling the pt back to endorse to her that per Dr Meda Coffee, she would like for her to be scheduled with an APP in our office. Pt did not answer and had no VM set-up. Went ahead and scheduled the pt to see Daune Perch NP for next Monday 12/9 at 1000.   Will continue to try and reach out to the pt to endorse this appt, as well as I will mychart her this information to.

## 2018-03-05 NOTE — Telephone Encounter (Signed)
Pt emailed back to confirm that she can make it to see Daune Perch NP on 03/09/18 at [facility] for all the assistance provided.

## 2018-03-05 NOTE — Telephone Encounter (Signed)
Attempted to call the pt back to obtain more information from her, based on her URGENT mychart message she sent to myself and Dr Meda Coffee.  Pt did not answer.  If pt is acute or having active cardiac complaints, and she calls back, this is to go to a triage nurse.  Sent the pt a mychart message instructing her to call the office back, not send an email, with urgent needs.

## 2018-03-05 NOTE — Telephone Encounter (Signed)
Please can you schedule her with one of our PAs? Thank you!!

## 2018-03-05 NOTE — Telephone Encounter (Signed)
Called patient about her MyChart message. Patient stated she is having right arm pain and right hand tingling off and on for 2 days. Patient denies chest pain. Patient stated she has SOB, but she has SOB all the time, this is not new to her. Patient has follow up appointment with her PCP towards the end of December.  Informed patient that a message would be sent to Dr. Meda Coffee for advisement. Encouraged patient to go to urgent care if her symptoms do not improve or get worse.

## 2018-03-06 ENCOUNTER — Encounter: Payer: Self-pay | Admitting: Internal Medicine

## 2018-03-06 ENCOUNTER — Other Ambulatory Visit: Payer: Self-pay | Admitting: Neurology

## 2018-03-06 ENCOUNTER — Ambulatory Visit (INDEPENDENT_AMBULATORY_CARE_PROVIDER_SITE_OTHER): Payer: Medicare HMO | Admitting: Internal Medicine

## 2018-03-06 VITALS — BP 160/100 | HR 105 | Ht 66.0 in | Wt 199.8 lb

## 2018-03-06 DIAGNOSIS — R05 Cough: Secondary | ICD-10-CM

## 2018-03-06 DIAGNOSIS — J45991 Cough variant asthma: Secondary | ICD-10-CM | POA: Diagnosis not present

## 2018-03-06 DIAGNOSIS — I10 Essential (primary) hypertension: Secondary | ICD-10-CM

## 2018-03-06 DIAGNOSIS — Z23 Encounter for immunization: Secondary | ICD-10-CM | POA: Diagnosis not present

## 2018-03-06 DIAGNOSIS — R058 Other specified cough: Secondary | ICD-10-CM

## 2018-03-06 MED ORDER — VALSARTAN 160 MG PO TABS: 160.0000 mg | ORAL_TABLET | Freq: Every day | ORAL | 11 refills | Status: DC

## 2018-03-06 MED ORDER — ACETAMINOPHEN-CODEINE 300-60 MG PO TABS: 1.0000 | ORAL_TABLET | ORAL | 0 refills | Status: DC | PRN

## 2018-03-06 MED ORDER — CLOTRIMAZOLE 10 MG MT TROC: 10.0000 mg | Freq: Every day | OROMUCOSAL | 11 refills | Status: DC

## 2018-03-06 MED ORDER — AEROCHAMBER MV MISC: 0 refills | Status: AC

## 2018-03-06 NOTE — Progress Notes (Signed)
Subjective:   Patient ID: Erica [name], female     DOB: 07-20-1975    MRN: [id number]    Brief patient profile:  74 yobf with asthma all her life much worse since around 07/2010 referred by Dr [name] to pulmonary clinic in Sept 2012 Hx of Breast Cancer 05/2008    History of Present Illness  12/18/2010 Initial pulmonary office eval on acei and  advair cc dtc asthma x 4 months but baseline = excess saba use "even when better" at least twice daily despite daily advair, worse at hs in fact  used saba 3 x last night.  On chemo /RT for breast ca but last rx was Jan 2012 well before asthma worse.  To er multiple times >  no better with singular added x 3 months. rec Stop Lisinopril Dulera 200 Take 2 puffs first thing in am and then another 2 puffs about 12 hours later.  Work on inhaler technique  Nexium 40 mg   Take 30-60 min before first meal of the day and Pepcid 20 mg one bedtime until return Only use your albuterol as a rescue medication to be used if you can't catch your breath by resting or doing a relaxed purse lip breathing pattern. The less you use it, the better it will work when you need it.  Prednisone 10 mg take  4 each am x 2 days,   2 each am x 2 days,  1 each am x2days and stop    01/03/2011 f/u ov/ cc much better off ace and advair but still needing saba twice daily on avg. No  Purulent sputum. rec no change in rx/ rule of 2's reviewed     04/06/2013 NP Follow up and Med Review   We reviewed all her meds and organized them into a med calendar. She does have Medicaid but depends on samples. Did not bring all her meds with her today , says they are at home.  Says her breathing is the same since last ov w/ SOB, Has on /off wheezing, some chest heaviness, prod cough with yellow mucus. Good and bad days. Wears out easily .  Denies f/c/s, nausea, vomiting.  Did not stop singulair as recommended.  rec  Follow med calendar closely and bring to each visit > did not do  We are  referring you for a CPSTwith spirometry before and after> never done   04/18/2014 NP Follow up and Med Review / NP  Patient returns for a two-week follow-up and medication review. We reviewed all her medications organize them into a medication count with patient education It appears that she is taking her medications correctly, however, she has over 30 medications and has had several changes recently. Last visit, her [name] was decreased to the 158mcg says that her breathing was better on the 272mcg Patient had a chest x-ray last visit that showed no acute changes She underwent a CT sinus due to persistent cough , which was positive for sinusitis on the right She was treated with a ten-day course of Avelox, she has one dose left Does feel some better but still has cough . She denies any hemoptysis, orthopnea, PND or leg swelling  Patient has upcoming sleep study. Next week for suspected underlying sleep apnea with snoring and daytime sleepiness rec Refer to ENT for sinusitis and chronic cough > never saw one> f/u sinus CT neg 07/26/14  Increase Dulera 200 2 puffs Twice daily   Follow med calendar closely and  bring to each visit.     08/01/2014 f/u ov/ re:  Asthma plus noct "gasping"  Chief Complaint  Patient presents with  . Follow-up    doing well.  no complaints.  not doing well x one year with noct / early  4am with cough/ gasping for air almost always goes to neb and then can't go back to sleep even 30 min later better Daytime in chest tightness resolves  completely while on prednisone last took one month prior to OV  And doesn't have it now but has exact same noct symptoms and overusing saba  Doe x mailbox and back to house out of breath and coughing  >>change dulera 200 to 100   08/30/2014 NP Follow up and Med review : Asthma Patient presents for a one-month follow-up We reviewed all her medications organize them into a medication count with patient education Patient has had  multiple medication changes recently She has been having difficulty with resistant hypertension and visual changes. Recently started on primidone and spironolactone . However, has not started this as of yet That she needs to go pick this up at the pharmacy. Neuro notes reviewed , has been referred to NS . B/p is elevated again at todays visit, discussed follow up with PCP for b/p management.  She tried to decrease her Dulera as recommended from last ov with Dr. Melvyn Novas    however, was unable to and is back on 200 dose .  Says overall her breathing is doing ok  No flare of cough or wheezing  Does have a lot of drainage in throat . Discussed starting on chlortrimeton.  rec Continue Dulera 200 2 puffs Twice daily  , rinse after use  Follow med calendar closely and bring to each visit.  Follow up with Primary MD concerning elevated blood pressure    10/05/2014 f/u ov/ re: asthma with component of uacs/ no med calendar  Chief Complaint  Patient presents with  . Follow-up    Here to discuss meds- Dulera not on her drug formulary. Pt states breathing is doing well and denies any new co's today.  turns out using neb at least once every 24 h, esp between 2 and 4 am when wakes up with severe dry cough x months/ pred helps some but never eliminated noct cough rec symbicort 160 Take 2 puffs first thing in am and then another 2 puffs about 12 hours later.  Try taking the tramadol around 2 am to see if helps with the early am cough/ wheeze  See calendar for specific medication instructions and bring it back for each and every office visit  Separating the top medications from the bottom group is fundamental to providing you adequate care going forward.   Please schedule a follow up office visit in 2 weeks, sooner if needed with all medications in 2 separate bags    10/19/2014 f/u ov/ re:  Asthma vs uacs/ not using med calendar correclty  Chief Complaint  Patient presents with  . Follow-up     Cough has changed sicnce starting symbicort- prod cough with "dirty" sputum. She states she feels that she can tell when her PM dose is due b/c she starts feeling SOB. She is using proair once per day on average.   not using symbicort 160 correctly  rec Prednisone 10 mg take  4 each am x 2 days,  2 each am x 2 days,  1 each am x 2 days and stop  Bystolic 10  mg take twice daily until return  Take 2 chlortrimeton at bedtime and 2 more 4 hours later  Continue symbicort 160 Take 2 puffs first thing in am and then another 2 puffs about 12 hours later.  Only use your albuterol as rescue  Only use nebulizer if you try proventil first and it fails to work  Care everywhere notes reviewed from neurosurgery from [facility] , recent right frontal VP shunt  rec at d/c 01/25/16 rec d/c reglan/ hycodan for cough            07/07/2017  f/u ov/ re: chronic cough/ asthma/ no med calendar/ no meds / on ACEi but hasn't started  Chief Complaint  Patient presents with  . Follow-up    voiced no concerns   Dyspnea:  MMRC3 = can't walk 100 yards even at a slow pace at a flat grade s stopping due to sob   Cough: some/ 4 am is better / taking Tyl #4 around 11 am  Sleep: better but still sev times a week wakes up coughing around 4 am (was q noct previously)  SABA use:  2 x daily / neb 2-3 per week  Using afrin though not on med cal rec  I emphasized that nasal steroids have no immediate benefit  If not better >  Prednisone 10 mg take  4 each am x 2 days,   2 each am x 2 days,  1 each am x 2 days and stop  If still not better call [facility] at [phone] for CT sinus  See calendar for specific medication instructions and bring it back for each and every office visit for every healthcare provider you see.  Without it,  you may not receive the best quality medical care that we feel you deserve. Separating the top medications from the bottom group is fundamental to providing you adequate care going forward.  Please  schedule a follow up office visit in 4 weeks, sooner if needed  with all medications /inhalers/ solutions in hand so we can verify exactly what you are taking. This includes all medications from all doctors and over the counters  - they should be separated like your med calendar in two bags:  Maintenance vs as needed    03/06/2018  f/u ov/ re: pseudoasthma Chief Complaint  Patient presents with  . Follow-up    Increased wheezing "when it got really cold".  She is using her albuterol inhaler and neb both a few x per wk.   Dyspnea:  MMRC3 = can't walk 100 yards even at a slow pace at a flat grade s stopping due to sob   Cough: dry cough on acei Sleeping: 30 degrees on cpap SABA use: a few times a week 02: just cpap    Overt hb despite dexilant    No obvious day to day or daytime variability or assoc excess/ purulent sputum or mucus plugs or hemoptysis or cp or chest tightness  or overt sinus   symptoms.   Sleeping as above  without nocturnal  or early am exacerbation  of respiratory  c/o's or need for noct saba. Also denies any obvious fluctuation of symptoms with weather or environmental changes or other aggravating or alleviating factors except as outlined above   No unusual exposure hx or h/o childhood pna/ asthma or knowledge of premature birth.  Current Allergies, Complete Past Medical History, Past Surgical History, Family History, and Social History were reviewed in [employer] record.  ROS  The following are not active complaints unless bolded Hoarseness, sore throat, dysphagia, dental problems, itching, sneezing,  nasal congestion or discharge of excess mucus or purulent secretions, ear ache,   fever, chills, sweats, unintended wt loss or wt gain, classically pleuritic or exertional cp,  orthopnea pnd or arm/hand swelling  or leg swelling, presyncope, palpitations, abdominal pain, anorexia, nausea, vomiting, diarrhea  or change in bowel habits or change in  bladder habits, change in stools or change in urine, dysuria, hematuria,  rash, arthralgias, visual complaints, headache, numbness, weakness or ataxia or problems with walking or coordination,  change in mood or  memory.        Current Meds  Medication Sig  . acetaminophen-codeine (TYLENOL #4) 300-60 MG tablet Take 1 tablet by mouth every 4 (four) hours as needed for pain.  Marland Kitchen albuterol (PROVENTIL) (2.5 MG/3ML) 0.083% nebulizer solution USE 1 VIAL VIA NEBULIZER EVERY 4 HOURS AND AS NEEDED FOR WHEEZING OR SHORTNESS OF BREATH  . amitriptyline (ELAVIL) 100 MG tablet Take 100 mg by mouth at bedtime.  Marland Kitchen amLODipine (NORVASC) 10 MG tablet Take 1 tablet (10 mg total) by mouth daily.  . Biotin 10000 MCG TABS Take 1 tablet by mouth daily.  . budesonide-formoterol (SYMBICORT) 160-4.5 MCG/ACT inhaler INHALE 2 PUFFS INTO LUNGS 2 TIMES DAILY AS DIRECTED  . carvedilol (COREG) 6.25 MG tablet TAKE 1 TABLET BY MOUTH TWICE A DAY  . chlorpheniramine (CHLOR-TRIMETON) 4 MG tablet 2 tabs by mouth at bedtime.  May take 1 extra every 4 hours as needed  . chlorthalidone (HYGROTON) 25 MG tablet TAKE 1 TABLET (25 MG TOTAL) DAILY BY MOUTH.  . cloNIDine (CATAPRES) 0.3 MG tablet TAKE 1 TABLET BY MOUTH TWICE A DAY  . dexlansoprazole (DEXILANT) 60 MG capsule Take 1 capsule (60 mg total) by mouth daily.  Marland Kitchen dextromethorphan-guaiFENesin (MUCINEX DM) 30-600 MG 12hr tablet Take 1 tablet by mouth 2 (two) times daily.   Marland Kitchen dicyclomine (BENTYL) 20 MG tablet TAKE 1 TABLET BY MOUTH 3 TIMES DAILY AS NEEDED FOR SPASMS  . diltiazem (CARDIZEM LA) 420 MG 24 hr tablet Take 1 tablet (420 mg total) by mouth daily.  . DULoxetine (CYMBALTA) 30 MG capsule Take 1 capsule (30 mg total) by mouth daily. With 60mg  for total of 90 mg  . DULoxetine (CYMBALTA) 60 MG capsule Take 1 capsule (60 mg total) by mouth daily. For total of 90 mg  . EPINEPHRINE 0.3 mg/0.3 mL IJ SOAJ injection INJECT 0.3 MLS INTO THE MUSCLE ONCE AS NEEDED FOR ANAPHYLAXIS  . furosemide  (LASIX) 40 MG tablet TAKE 1 TABLET BY MOUTH TWICE A DAY  . hydrALAZINE (APRESOLINE) 100 MG tablet TAKE 1 TABLET (100 MG TOTAL) BY MOUTH 3 (THREE) TIMES DAILY.  Marland Kitchen lidocaine-prilocaine (EMLA) cream Apply 1 application topically. Apply 1 application to skin ever 28 days  . LORazepam (ATIVAN) 0.5 MG tablet Take 0.5 mg by mouth every 8 (eight) hours.  . metoCLOPramide (REGLAN) 10 MG tablet TAKE 1 TABLET BY MOUTH 4 TIMES A DAY WITH MEALS AND AT BEDTIME  . montelukast (SINGULAIR) 10 MG tablet TAKE 1 TABLET BY MOUTH ONCE DAILY AT BEDTIME  . NITROSTAT 0.4 MG SL tablet PLACE 1 TABLET UNDER TONGUE EVERY 5 MINUTES AS NEEDED FOR CHEST PAIN, MAX OF 3 TABLETS THEN CALL 911 IF PAIN PERSISTS  . OXYGEN Use CPAP at bedtime  . oxymetazoline (MUCINEX NASAL SPRAY FULL FORCE) 0.05 % nasal spray Place 1 spray into both nostrils 2 (two) times daily.  . potassium chloride SA (  K-DUR,KLOR-CON) 20 MEQ tablet Take 20 mEq by mouth 2 (two) times daily.  . primidone (MYSOLINE) 50 MG tablet TAKE 0.5 TABLETS (25 MG TOTAL) BY MOUTH AT BEDTIME.  . promethazine (PHENERGAN) 25 MG tablet Take 1 tablet (25 mg total) by mouth 3 (three) times daily as needed for nausea or vomiting.  . ranitidine (ZANTAC) 150 MG tablet Take 1 tablet (150 mg total) by mouth 2 (two) times daily.  . ranolazine (RANEXA) 500 MG 12 hr tablet TAKE 1 TABLET BY MOUTH TWICE A DAY  . Respiratory Therapy Supplies (FLUTTER) DEVI Use as directed  . spironolactone (ALDACTONE) 100 MG tablet TAKE 1 TABLET BY MOUTH EVERY DAY  . SYMBICORT 160-4.5 MCG/ACT inhaler INHALE 2 PUFFS INTO LUNGS 2 TIMES DAILY AS DIRECTED  . tapentadol HCl (NUCYNTA) 75 MG tablet Take 1 tablet (75 mg total) every 6 (six) hours as needed by mouth.  . topiramate (TOPAMAX) 25 MG tablet Take 25 mg by mouth 2 (two) times daily.  . traZODone (DESYREL) 150 MG tablet TAKE 1 TABLET BY MOUTH AT BEDTIME  . VENTOLIN HFA 108 (90 Base) MCG/ACT inhaler INHALE 2 PUFFS BY MOUTH EVERY 4 HOURS AS NEEDED FOR WHEEZE OR  FOR SHORTNESS OF BREATH  . [  acetaminophen-codeine (TYLENOL #4) 300-60 MG tablet TAKE 1 TABLET EVERY 4 HOURS AS NEEDED  . [  acetaZOLAMIDE (DIAMOX) 250 MG tablet TAKE 4 TABLETS (1,000 MG TOTAL) BY MOUTH 3 (THREE) TIMES DAILY.  Marland Kitchen   Mometasone Furoate (ASMANEX HFA) 200 MCG/ACT AERO Inhale 2 puffs into the lungs 2 (two) times daily.             Objective:   Physical Exam   amb bf nad  Vital signs reviewed - Note on arrival 02 sats  97% on RA and bp 160/100         Wt 213  12/18/10 > 01/03/2011  214 > 01/31/2011  206 >  09/11/2012 209 >208 6/19 > 210 09/25/2012 > 11/20/2012 211 >208 12/07/12> 01/12/2013 209 > 02/09/2013 210 >207 04/06/2013 > 07/14/2013  213>212 07/28/2013 >204 01/26/2014> 04/04/2014 206  >205 04/18/2014 >  07/18/2014 201 >  08/01/14 201 >198 08/30/2014 > 10/05/14  195 > 10/19/2014 192 > 191 11/08/2014 > 02/10/2015  198 > 03/24/2015   198 > 06/29/2015   201 > 08/31/2015  204 > 11/17/2015  197 > 12/15/2015  188 > 03/22/2016   188 > 07/07/2017  198 > 03/06/2018  199       HEENT: nl dentition, and oropharynx pos for thrush . Nl external ear canals without cough reflex - mod severe bilateral non-specific turbinate edema     NECK :  without JVD/Nodes/TM/ nl carotid upstrokes bilaterally   LUNGS: no acc muscle use,  Nl contour chest with mostly pseudowheezing without cough on insp or exp maneuvers   CV:  RRR  no s3 or murmur or increase in P2, and  Trace pitting edema both ankles   ABD:  soft and nontender with nl inspiratory excursion in the supine position. No bruits or organomegaly appreciated, bowel sounds nl  MS:  Nl gait/ ext warm without deformities, calf tenderness, cyanosis or clubbing No obvious joint restrictions   SKIN: warm and dry without lesions    NEURO:  alert, approp, nl sensorium with  no motor or cerebellar deficits apparent.

## 2018-03-06 NOTE — Patient Instructions (Addendum)
Stop lisinopril and asmanex   Start diovan 160 mg at bedtime   Clotrimazole troche up to 5 x daily and use spacer for your symbicort but delay the breath after you trigger  Please schedule a follow up visit in 3 months but call sooner if needed

## 2018-03-08 ENCOUNTER — Encounter: Payer: Self-pay | Admitting: Internal Medicine

## 2018-03-08 NOTE — Assessment & Plan Note (Signed)
In the best review of chronic cough to date ( NEJM 2016 375 [phone]) ,  ACEi are now felt to cause cough in up to  20% of pts which is a 4 fold increase from previous reports and does not include the variety of non-specific complaints we see in pulmonary clinic in pts on ACEi but previously attributed to another dx like  Copd/asthma and  include PNDS, throat and chest congestion, "bronchitis", unexplained dyspnea and noct "strangling" sensations, and hoarseness, but also  atypical /refractory GERD symptoms like dysphagia and "bad heartburn"   The only way I know  to prove this is not an "ACEi Case" is a trial off ACEi x a minimum of 6 weeks then regroup.   Try diovan 160 mg daily     I had an extended discussion with the patient reviewing all relevant studies completed to date and  lasting 15 to 20 minutes of a 25 minute visit    See device teaching which extended face to face time for this visit   Each maintenance medication was reviewed in detail including most importantly the difference between maintenance and prns and under what circumstances the prns are to be triggered using an action plan format that is not reflected in the computer generated alphabetically organized AVS.     Please see AVS for specific instructions unique to this visit that I personally wrote and verbalized to the the pt in detail and then reviewed with pt  by my nurse highlighting any  changes in therapy recommended at today's visit to their plan of care. ,

## 2018-03-08 NOTE — Assessment & Plan Note (Signed)
-   PFT's 01/31/2011 FEV1  1.76 (60% ) ratio 68 and no better p B2,  DLCO 70% -med review 09/25/2012 > Dulera drug assistance  paperwork given    - HFA 90% 11/20/2012 .  -Med calendar 12/07/12 , 04/06/2013 , 04/18/2014,, 08/30/2014 , 11/08/2014  - PFT's 01/12/2013  1.73 (65%)  Ratio 73 and no better p B2, DLCO 86% - trial off singulair 02/08/13 >>>did not stop  - 04/05/2014  trial of dulera 100 instead of 200 > worse  -04/18/2014 increase Dulera 200  - 08/01/2014 p extensive coaching HFA effectiveness =    90% - 08/01/2014  Walked RA x 2laps @ 185 ft each stopped due to cough > sob/ no desat moderate pace  - spirometry 08/01/2014  FEV1  1.87 (70%) ratio 78  fef 25-75 53%  - spirometry 02/10/2015  flattening of top portion of f/v loop only    - 02/10/2015   Walked RA  2 laps @ 185 ft each stopped due to cough > sob/ slow pace   -  NO 03/24/2015   =  16 - Methacholine challenge 04/04/2015   POS asthma = wheeze/sob but no coughing fits > added qvar 80 2bid 06/29/2015  - Allergy profile 11/17/2015 >  Eos 0.1 /  IgE  80 POS RAST  to grass, ragweed, cedar tree    - Spirometry 08/31/2015  FEV1 0.75 (28%)  Ratio 68  - FENO 11/17/2015  =  8  on symb 160 2bid and qvar 80 2bid  - FENO 03/22/2016  = 36 on symb 160/qvar 80 though not able to verify being used as directed     - 03/06/2018  After extensive coaching inhaler device,  effectiveness =    90% s spacer    The asthma component of her problem is well controlled on symb 160 2bid so does not need asmanex which risks worse irritation of the upper airway and thrush developing so rec use spacer/ prn clotrimazole and d/c asmanex

## 2018-03-08 NOTE — Assessment & Plan Note (Signed)
MRI 05/18/15 > Paranasal sinuses are clear - 11/17/2015 rec increase h1 to 2 hs to help with noct cough  - Allergy profile 11/17/2015 >  Eos 0.1 /  IgE  80 POS RAST  to grass, ragweed, cedar tree   - d/c acei 03/06/2018    Upper airway cough syndrome (previously labeled PNDS),  is so named because it's frequently impossible to sort out how much is  CR/sinusitis with freq throat clearing (which can be related to primary GERD)   vs  causing  secondary (" extra esophageal")  GERD from wide swings in gastric pressure that occur with throat clearing, often  promoting self use of mint and menthol lozenges that reduce the lower esophageal sphincter tone and exacerbate the problem further in a cyclical fashion.   These are the same pts (now being labeled as having "irritable larynx syndrome" by some cough centers) who not infrequently have a history of having failed to tolerate ace inhibitors,  dry powder inhalers or biphosphonates or report having atypical/extraesophageal reflux symptoms that don't respond to standard doses of PPI  and are easily confused as having aecopd or asthma flares by even experienced allergists/ pulmonologists (myself included).

## 2018-03-09 ENCOUNTER — Ambulatory Visit (INDEPENDENT_AMBULATORY_CARE_PROVIDER_SITE_OTHER): Payer: Medicare HMO | Admitting: Cardiology

## 2018-03-09 ENCOUNTER — Other Ambulatory Visit: Payer: Self-pay | Admitting: Neurology

## 2018-03-09 ENCOUNTER — Encounter: Payer: Self-pay | Admitting: Cardiology

## 2018-03-09 VITALS — BP 192/100 | HR 105 | Ht 66.0 in | Wt 204.4 lb

## 2018-03-09 DIAGNOSIS — I5032 Chronic diastolic (congestive) heart failure: Secondary | ICD-10-CM

## 2018-03-09 DIAGNOSIS — R0602 Shortness of breath: Secondary | ICD-10-CM | POA: Diagnosis not present

## 2018-03-09 DIAGNOSIS — R079 Chest pain, unspecified: Secondary | ICD-10-CM

## 2018-03-09 DIAGNOSIS — G4733 Obstructive sleep apnea (adult) (pediatric): Secondary | ICD-10-CM

## 2018-03-09 DIAGNOSIS — I10 Essential (primary) hypertension: Secondary | ICD-10-CM

## 2018-03-09 DIAGNOSIS — I119 Hypertensive heart disease without heart failure: Secondary | ICD-10-CM

## 2018-03-09 LAB — BASIC METABOLIC PANEL
BUN/Creatinine Ratio: 12 (ref 9–23)
BUN: 15 mg/dL (ref 6–24)
CO2: 26 mmol/L (ref 20–29)
Calcium: 9.6 mg/dL (ref 8.7–10.2)
Chloride: 102 mmol/L (ref 96–106)
Creatinine, Ser: 1.21 mg/dL — ABNORMAL HIGH (ref 0.57–1.00)
GFR calc Af Amer: 63 mL/min/{1.73_m2} (ref >59)
GFR calc non Af Amer: 55 mL/min/{1.73_m2} — ABNORMAL LOW (ref >59)
Glucose: 87 mg/dL (ref 65–99)
Potassium: 4.2 mmol/L (ref 3.5–5.2)
Sodium: 145 mmol/L — ABNORMAL HIGH (ref 134–144)

## 2018-03-09 MED ORDER — METOPROLOL TARTRATE 100 MG PO TABS: 100.0000 mg | ORAL_TABLET | ORAL | 0 refills | Status: DC

## 2018-03-09 NOTE — Progress Notes (Signed)
Cardiology Office Note:    Date:  03/09/2018   ID:  Erica [name], DOB 29-Jan-1976, MRN [id number]  PCP:  [name], MD  Cardiologist:  Ena Dawley, MD  Referring MD: [name], MD   Chief Complaint  Patient presents with  . Chest Pain  . Shortness of Breath    History of Present Illness:    Erica [name] is a 42 y.o. female with a past medical history significant for breast cancer 2010 treated with chemo and radiation-now on immunotherapy, asthma, chronic cough, hypertension, GERD, OSA on CPAP and obesity.  She also has a history of recurrent syncope with cardiac monitor showing only sinus rhythm.  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  She has been seen by our hypertension clinic in the past for management of blood pressures poorly controlled despite multiple medications.  She was seen by Dr. Meda Coffee in January with complaints of DOE on minimal exertion, orthopnea, and PND.  She did not appear volume overloaded.  A coronary CTA was ordered but not done.  Today she is here for evaluation right arm pain with tingling in the hand that lasted ~15-20 minutes ~5-6 days ago. This has reocccured one time briefly. She also Has had central chest pressure and under her left breast. She often has this related to her asthma. Last night she awoke with shortness of breath and coughing so hard that she could not catch her breath; for about a minute she could not get any air in. She coughed till she vomited. She used her inhaler but it took about 1 1/2 - 2 hours to feel better. This has been occurring for the last 2 years and occurs as frequent as every other night.   She has CPAP but has been unable to tolerate due to feeling like she is suffocating.   She went to her pulmonologist on Friday, 03/06/2018 for chronic cough.  Her  ACE inhibitor was discontinued and switched to Diovan but she has not received her Diovan yet so has not yet switched. She is supposed to receive it today.  She is mostly sedentary and falls alseep often during the day.   Past Medical History:  Diagnosis Date  . Asthma   . Breast cancer ([place]) 2010   a. locally advanced left breast carcinoma diagnosed in 2010 and treated with neoadjuvant chemotherapy with docetaxel and cyclophosphamide as well as paclitaxel. This was followed by radiation therapy which was completed in 2011.  Marland Kitchen Dyspnea on exertion    a. 01/2013 Lexi MV: EF 54%, no ischemia/infarct;  b. 05/2013 Echo: EF 60-65%, no rwma, Gr 2 DD;  b.   . Fatty liver   . Gastroparesis   . GERD (gastroesophageal reflux disease)   . Hypertension   . Impaired glucose tolerance 04/13/2011  . Lymphadenitis, chronic    restricted LEFT extremity  . 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 OSA (obstructive sleep apnea) 05/04/2014   AHI 31/hr now on CPAP at 12cm H2O  . Personal history of chemotherapy 2010  . Personal history of radiation therapy 2010    Past Surgical History:  Procedure Laterality Date  . BRAIN SURGERY     Shunt  . BREAST LUMPECTOMY Left 09/2008  . CARPAL TUNNEL RELEASE Left 2011  . LYMPH NODE DISSECTION Left 2010   breast;  2 wks after breast lumpectomy  . TENDON RELEASE Right 2012   "hand"  . TUBAL LIGATION  05/11/2011   Procedure: POST PARTUM TUBAL LIGATION;  Surgeon: Emeterio Reeve, MD;  Location: [facility];  Service: Gynecology;  Laterality: Bilateral;  Induced for HTN    Current Medications: Current Meds  Medication Sig  . acetaminophen-codeine (TYLENOL #4) 300-60 MG tablet Take 1 tablet by mouth every 4 (four) hours as needed for pain.  Marland Kitchen acetaZOLAMIDE (DIAMOX) 250 MG tablet TAKE 4 TABLETS BY MOUTH 3 (THREE) TIMES DAILY.  Marland Kitchen albuterol (PROVENTIL) (2.5 MG/3ML) 0.083% nebulizer solution USE 1 VIAL VIA NEBULIZER EVERY 4 HOURS AND AS NEEDED FOR WHEEZING OR SHORTNESS OF BREATH    . amitriptyline (ELAVIL) 100 MG tablet Take 100 mg by mouth at bedtime.  Marland Kitchen amLODipine (NORVASC) 10 MG tablet Take 1 tablet (10 mg total) by mouth daily.  . Biotin 10000 MCG TABS Take 1 tablet by mouth daily.  . budesonide-formoterol (SYMBICORT) 160-4.5 MCG/ACT inhaler INHALE 2 PUFFS INTO LUNGS 2 TIMES DAILY AS DIRECTED  . carvedilol (COREG) 6.25 MG tablet TAKE 1 TABLET BY MOUTH TWICE A DAY  . chlorpheniramine (CHLOR-TRIMETON) 4 MG tablet 2 tabs by mouth at bedtime.  May take 1 extra every 4 hours as needed  . chlorthalidone (HYGROTON) 25 MG tablet TAKE 1 TABLET (25 MG TOTAL) DAILY BY MOUTH.  . cloNIDine (CATAPRES) 0.3 MG tablet TAKE 1 TABLET BY MOUTH TWICE A DAY  . clotrimazole (MYCELEX) 10 MG troche Take 1 tablet (10 mg total) by mouth 5 (five) times daily.  Marland Kitchen dexlansoprazole (DEXILANT) 60 MG capsule Take 1 capsule (60 mg total) by mouth daily.  Marland Kitchen dextromethorphan-guaiFENesin (MUCINEX DM) 30-600 MG 12hr tablet Take 1 tablet by mouth 2 (two) times daily.   Marland Kitchen dicyclomine (BENTYL) 20 MG tablet TAKE 1 TABLET BY MOUTH 3 TIMES DAILY AS NEEDED FOR SPASMS  . diltiazem (CARDIZEM LA) 420 MG 24 hr tablet Take 1 tablet (420 mg total) by mouth daily.  . DULoxetine (CYMBALTA) 30 MG capsule Take 1 capsule (30 mg total) by mouth daily. With 60mg  for total of 90 mg  . DULoxetine (CYMBALTA) 60 MG capsule Take 1 capsule (60 mg total) by mouth daily. For total of 90 mg  . EPINEPHRINE 0.3 mg/0.3 mL IJ SOAJ injection INJECT 0.3 MLS INTO THE MUSCLE ONCE AS NEEDED FOR ANAPHYLAXIS  . furosemide (LASIX) 40 MG tablet TAKE 1 TABLET BY MOUTH TWICE A DAY  . hydrALAZINE (APRESOLINE) 100 MG tablet TAKE 1 TABLET (100 MG TOTAL) BY MOUTH 3 (THREE) TIMES DAILY.  Marland Kitchen lidocaine-prilocaine (EMLA) cream Apply 1 application topically. Apply 1 application to skin ever 28 days  . LORazepam (ATIVAN) 0.5 MG tablet Take 0.5 mg by mouth every 8 (eight) hours.  . metoCLOPramide (REGLAN) 10 MG tablet TAKE 1 TABLET BY MOUTH 4 TIMES A DAY WITH  MEALS AND AT BEDTIME  . montelukast (SINGULAIR) 10 MG tablet TAKE 1 TABLET BY MOUTH ONCE DAILY AT BEDTIME  . NITROSTAT 0.4 MG SL tablet PLACE 1 TABLET UNDER TONGUE EVERY 5 MINUTES AS NEEDED FOR CHEST PAIN, MAX OF 3 TABLETS THEN CALL 911 IF PAIN PERSISTS  . OXYGEN Use CPAP at bedtime  . oxymetazoline (MUCINEX NASAL SPRAY FULL FORCE) 0.05 % nasal spray Place 1 spray into both nostrils 2 (two) times daily.  . potassium chloride SA (K-DUR,KLOR-CON) 20 MEQ tablet Take 20 mEq by mouth 2 (two) times daily.  . primidone (MYSOLINE) 50 MG tablet TAKE 0.5 TABLETS (25 MG TOTAL)  BY MOUTH AT BEDTIME.  . promethazine (PHENERGAN) 25 MG tablet Take 1 tablet (25 mg total) by mouth 3 (three) times daily as needed for nausea or vomiting.  . ranitidine (ZANTAC) 150 MG tablet Take 1 tablet (150 mg total) by mouth 2 (two) times daily.  . ranolazine (RANEXA) 500 MG 12 hr tablet TAKE 1 TABLET BY MOUTH TWICE A DAY  . Respiratory Therapy Supplies (FLUTTER) DEVI Use as directed  . Spacer/Aero-Holding Chambers (AEROCHAMBER MV) inhaler Use as instructed  . spironolactone (ALDACTONE) 100 MG tablet TAKE 1 TABLET BY MOUTH EVERY DAY  . SYMBICORT 160-4.5 MCG/ACT inhaler INHALE 2 PUFFS INTO LUNGS 2 TIMES DAILY AS DIRECTED  . tapentadol HCl (NUCYNTA) 75 MG tablet Take 1 tablet (75 mg total) every 6 (six) hours as needed by mouth.  . topiramate (TOPAMAX) 25 MG tablet Take 25 mg by mouth 2 (two) times daily.  . traZODone (DESYREL) 150 MG tablet TAKE 1 TABLET BY MOUTH AT BEDTIME  . valsartan (DIOVAN) 160 MG tablet Take 1 tablet (160 mg total) by mouth daily.  . VENTOLIN HFA 108 (90 Base) MCG/ACT inhaler INHALE 2 PUFFS BY MOUTH EVERY 4 HOURS AS NEEDED FOR WHEEZE OR FOR SHORTNESS OF BREATH     Allergies:   Aspirin; Doxycycline; Kiwi extract; Penicillins; Strawberry extract; and Sulfamethoxazole-trimethoprim   Social History   Socioeconomic History  . Marital status: Single    Spouse name: Not on file  . Number of children: 3  .  Years of education: Not on file  . Highest education level: Not on file  Occupational History    Employer: UNEMPLOYED  Social Needs  . Financial resource strain: Not on file  . Food insecurity:    Worry: Not on file    Inability: Not on file  . Transportation needs:    Medical: Not on file    Non-medical: Not on file  Tobacco Use  . Smoking status: Never Smoker  . Smokeless tobacco: Never Used  Substance and Sexual Activity  . Alcohol use: Yes    Alcohol/week: 0.0 standard drinks    Comment: occasionally/socially  . Drug use: No  . Sexual activity: Yes    Birth control/protection: Surgical  Lifestyle  . Physical activity:    Days per week: Not on file    Minutes per session: Not on file  . Stress: Not on file  Relationships  . Social connections:    Talks on phone: Not on file    Gets together: Not on file    Attends religious service: Not on file    Active member of club or organization: Not on file    Attends meetings of clubs or organizations: Not on file    Relationship status: Not on file  Other Topics Concern  . Not on file  Social History Narrative   She lives with four children.   She is currently not working (disbaility pending).        Family History: The patient's family history includes Breast cancer in her other and other; Diabetes in her maternal grandmother; Fibroids (age of onset: 18) in her sister; Heart disease in her maternal grandmother; Hypertension in her maternal grandmother; Other (age of onset: 69) in her mother. There is no history of Cancer, Alcohol abuse, Early death, Hyperlipidemia, Kidney disease, Stroke, or Colon cancer. ROS:   Please see the history of present illness.     All other systems reviewed and are negative.  EKGs/Labs/Other Studies Reviewed:    The following  studies were reviewed today:  Nuclear stress test: 02/15/2013 Impression Exercise Capacity: Lexiscan with no exercise.  BP Response: Hypertensive blood pressure  response.  Clinical Symptoms: Chest pain, headache.  ECG Impression: No significant ST segment change suggestive of ischemia.  Comparison with Prior Nuclear Study: No images to compare  Overall Impression: Normal stress nuclear study.  LV Ejection Fraction: 54%. LV Wall Motion: NL LV Function; NL Wall Motion  Loralie Champagne  02/12/2013   TTE 06/17/2013 Left ventricle: The cavity size was normal. There was mild concentric hypertrophy. Systolic function was normal. The estimated ejection fraction was in the range of 60% to 65%. Wall motion was normal; there were no regional wall motion abnormalities. Features are consistent with a pseudonormal left ventricular filling pattern, with concomitant abnormal relaxation and increased filling pressure (grade 2 diastolic dysfunction). Doppler parameters are consistent with high ventricular filling pressure. Impressions: - When compared to 2010, EF has improved.  TTE: 09/18/15 - Left ventricle: The cavity size was normal. Wall thickness was increased in a pattern of severe LVH. Systolic function was vigorous. The estimated ejection fraction was in the range of 65% to 70%. Doppler parameters are consistent with abnormal left ventricular relaxation (grade 1 diastolic dysfunction).    EKG:  EKG is ordered today.  The ekg ordered today demonstrates sinus tachycardia, RAE, LVH with repol abn.   Recent Labs: 04/04/2017: NT-Pro BNP 290 11/19/2017: ALT 35; Hemoglobin 14.6; Platelet Count 192 03/09/2018: BUN 15; Creatinine, Ser 1.21; Potassium 4.2; Sodium 145   Recent Lipid Panel    Component Value Date/Time   CHOL 195 10/15/2016 1726   TRIG 193.0 (H) 10/15/2016 1726   HDL 42.30 10/15/2016 1726   CHOLHDL 5 10/15/2016 1726   VLDL 38.6 10/15/2016 1726   LDLCALC 114 (H) 10/15/2016 1726    Physical Exam:    VS:  BP (!) 192/100   Pulse (!) 105   Ht 5\' 6"  (1.676 m)   Wt 204 lb 6.4 oz (92.7 kg)   SpO2 95%   BMI 32.99 kg/m      Wt Readings from Last 3 Encounters:  03/09/18 204 lb 6.4 oz (92.7 kg)  03/06/18 199 lb 12.8 oz (90.6 kg)  11/19/17 194 lb 1.6 oz (88 kg)     Physical Exam  Constitutional: She is oriented to person, place, and time. She appears well-developed and well-nourished. No distress.  Cental obesity  HENT:  Head: Normocephalic and atraumatic.  Neck: Normal range of motion. Neck supple. No JVD present.  Cardiovascular: Normal rate, regular rhythm, normal heart sounds and intact distal pulses. Exam reveals no gallop and no friction rub.  No murmur heard. Pulmonary/Chest: Effort normal and breath sounds normal. No respiratory distress. She has no wheezes. She has no rales.  Abdominal: Soft. Bowel sounds are normal.  Musculoskeletal: Normal range of motion. She exhibits no edema or deformity.  Neurological: She is alert and oriented to person, place, and time.  Skin: Skin is warm and dry.  Psychiatric: She has a normal mood and affect. Her behavior is normal. Judgment and thought content normal.  Vitals reviewed.    ASSESSMENT:    1. Chest pain, unspecified type   2. Essential hypertension   3. Hypertensive heart disease without heart failure   4. SOB (shortness of breath)   5. Chronic diastolic CHF (congestive heart failure) ([place])   6. Obstructive sleep apnea    PLAN:    In order of problems listed above:  Atypical chest pain: Along with shortness  of breath and cough that awakens her at night.  Her chest pain is atypical and she relates to her asthma.  She also has tenderness of the sternum with palpation and deep breathing.  Prior order for coronary CTA was not done, I will reorder this for evaluation of coronary arteries in relation to her chest pain.  Hypertensive heart disease, severe resistant hypertension -Long history of difficult to control hypertension blood pressure is 192/100 today. -No stenosis on renal artery ultrasound, normal renal parenchyma 03/2017 -Spironolactone  100 mg daily, Lasix 40 mg twice daily, carvedilol 6.25 mg twice daily.  Recently switched from lisinopril to Diovan by pulmonology due to cough (has not made the switch yet.  She should be receiving her new medication today.) -I will have her follow-up in the hypertension clinic in 1-2 weeks to assess her blood pressure and response to the medication change.  -Will check BMet for renal function and potassium.  Chronic diastolic CHF -Patient has had fairly chronic orthopnea and PND but has not appeared volume overloaded or visits.  Lasix was increased to twice daily at last visit in January and Spironolactone 100 mg added -She does not appear volume overloaded.  Chronic cough/shortness of breath: Followed by pulmonology.  ACE inhibitor was discontinued and patient placed on Diovan 160 mg daily.  Patient has history of asthma, uses inhalers and nebulizer.  She does experience significant coughing during the night that leads to shortness of breath.  This occurs about half of her. -We will check echocardiogram for any changes.  OSA: On CPAP.  Patient has been unable to tolerate her CPAP as she feels like she is suffocating.  She says she has tried nasal and facemask.  I will have her follow-up with Dr. Radford Pax for further evaluation and try to see if help her tolerate the CPAP.  We will see if stopping her ACE inhibitor helps this.   Medication Adjustments/Labs and Tests Ordered: Current medicines are reviewed at length with the patient today.  Concerns regarding medicines are outlined above. Labs and tests ordered and medication changes are outlined in the patient instructions below:  Patient Instructions  Medication Instructions:  Your physician recommends that you continue on your current medications as directed. Please refer to the Current Medication list given to you today.  If you need a refill on your cardiac medications before your next appointment, please call your pharmacy.   Lab  work: TODAY: BMET   If you have labs (blood work) drawn today and your tests are completely normal, you will receive your results only by: Marland Kitchen MyChart Message (if you have MyChart) OR . A paper copy in the mail If you have any lab test that is abnormal or we need to change your treatment, we will call you to review the results.  Testing/Procedures: Your physician has requested that you have an echocardiogram. Echocardiography is a painless test that uses sound waves to create images of your heart. It provides your doctor with information about the size and shape of your heart and how well your heart's chambers and valves are working. This procedure takes approximately one hour. There are no restrictions for this procedure. Please arrive at the [facility] main entrance of [facility] at xx:xx AM (30-45 minutes prior to test start time)  [facility] [address], [place] [address] [phone]  Proceed to the [facility] Radiology Department (First Floor).  Please follow these instructions carefully (unless otherwise directed):  Hold  all erectile dysfunction medications at least 48 hours prior to test.  On the Night Before the Test: . Be sure to Drink plenty of water. . Do not consume any caffeinated/decaffeinated beverages or chocolate 12 hours prior to your test. . Do not take any antihistamines 12 hours prior to your test. . If you take Metformin do not take 24 hours prior to test.  On the Day of the Test: . Drink plenty of water. Do not drink any water within one hour of the test. . Do not eat any food 4 hours prior to the test. . You may take your regular medications prior to the test.  . Take metoprolol (Lopressor) two hours prior to test. . HOLD Furosemide/Hydrochlorothiazide morning of the test.       After the Test: . Drink plenty of water. . After receiving IV contrast, you may experience a mild flushed feeling. This is normal. . On  occasion, you may experience a mild rash up to 24 hours after the test. This is not dangerous. If this occurs, you can take Benadryl 25 mg and increase your fluid intake. . If you experience trouble breathing, this can be serious. If it is severe call 911 IMMEDIATELY. If it is mild, please call our office. . If you take any of these medications: Glipizide/Metformin, Avandament, Glucavance, please do not take 48 hours after completing test.     Follow-Up: At [facility], you and your health needs are our priority.  As part of our continuing mission to provide you with exceptional heart care, we have created designated Provider Care Teams.  These Care Teams include your primary Cardiologist (physician) and Advanced Practice Providers (APPs -  Physician Assistants and Nurse Practitioners) who all work together to provide you with the care you need, when you need it. You will need a follow up appointment in 1 months. You may see Ena Dawley, MD or one of the following Advanced Practice Providers on your designated Care Team:   [place], PA-C Melina Copa, PA-C . Ermalinda Barrios, PA-C  FOLLOW UP WITH OUR HYPERTENSION CLINIC IN 1-2 WEEKS  PLEASE SCHEDULE AN APPOINTMENT TO SEE DR.TURNER ASAP FOR SLEEP APNEA   Any Other Special Instructions Will Be Listed Below (If Applicable).       Signed, Daune Perch, NP  03/09/2018 6:52 PM    [facility]

## 2018-03-09 NOTE — Patient Instructions (Addendum)
Medication Instructions:  Your physician recommends that you continue on your current medications as directed. Please refer to the Current Medication list given to you today.  If you need a refill on your cardiac medications before your next appointment, please call your pharmacy.   Lab work: TODAY: BMET   If you have labs (blood work) drawn today and your tests are completely normal, you will receive your results only by: Marland Kitchen MyChart Message (if you have MyChart) OR . A paper copy in the mail If you have any lab test that is abnormal or we need to change your treatment, we will call you to review the results.  Testing/Procedures: Your physician has requested that you have an echocardiogram. Echocardiography is a painless test that uses sound waves to create images of your heart. It provides your doctor with information about the size and shape of your heart and how well your heart's chambers and valves are working. This procedure takes approximately one hour. There are no restrictions for this procedure. Please arrive at the [facility] main entrance of [facility] at xx:xx AM (30-45 minutes prior to test start time)  [facility] [place], [place] [address] [phone]  Proceed to the [facility] Radiology Department (First Floor).  Please follow these instructions carefully (unless otherwise directed):  Hold all erectile dysfunction medications at least 48 hours prior to test.  On the Night Before the Test: . Be sure to Drink plenty of water. . Do not consume any caffeinated/decaffeinated beverages or chocolate 12 hours prior to your test. . Do not take any antihistamines 12 hours prior to your test. . If you take Metformin do not take 24 hours prior to test.  On the Day of the Test: . Drink plenty of water. Do not drink any water within one hour of the test. . Do not eat any food 4 hours prior to the test. . You may take your regular  medications prior to the test.  . Take metoprolol (Lopressor) two hours prior to test. . HOLD Furosemide/Hydrochlorothiazide morning of the test.       After the Test: . Drink plenty of water. . After receiving IV contrast, you may experience a mild flushed feeling. This is normal. . On occasion, you may experience a mild rash up to 24 hours after the test. This is not dangerous. If this occurs, you can take Benadryl 25 mg and increase your fluid intake. . If you experience trouble breathing, this can be serious. If it is severe call 911 IMMEDIATELY. If it is mild, please call our office. . If you take any of these medications: Glipizide/Metformin, Avandament, Glucavance, please do not take 48 hours after completing test.     Follow-Up: At [facility], you and your health needs are our priority.  As part of our continuing mission to provide you with exceptional heart care, we have created designated Provider Care Teams.  These Care Teams include your primary Cardiologist (physician) and Advanced Practice Providers (APPs -  Physician Assistants and Nurse Practitioners) who all work together to provide you with the care you need, when you need it. You will need a follow up appointment in 1 months. You may see Ena Dawley, MD or one of the following Advanced Practice Providers on your designated Care Team:   [place], PA-C Melina Copa, PA-C . Ermalinda Barrios, PA-C  FOLLOW UP WITH OUR HYPERTENSION CLINIC IN 1-2 WEEKS  PLEASE SCHEDULE AN APPOINTMENT TO  SEE DR.TURNER ASAP FOR SLEEP APNEA   Any Other Special Instructions Will Be Listed Below (If Applicable).

## 2018-03-10 NOTE — Addendum Note (Signed)
Addended by: Mendel Ryder on: 03/10/2018 10:26 AM   Modules accepted: Orders

## 2018-03-11 ENCOUNTER — Inpatient Hospital Stay: Payer: Medicare HMO | Attending: Oncology

## 2018-03-11 VITALS — BP 185/102 | HR 106 | Resp 18

## 2018-03-11 DIAGNOSIS — Z5111 Encounter for antineoplastic chemotherapy: Secondary | ICD-10-CM | POA: Diagnosis not present

## 2018-03-11 DIAGNOSIS — C50412 Malignant neoplasm of upper-outer quadrant of left female breast: Secondary | ICD-10-CM

## 2018-03-11 MED ORDER — GOSERELIN ACETATE 3.6 MG ~~LOC~~ IMPL
3.6000 mg | DRUG_IMPLANT | SUBCUTANEOUS | Status: DC
  Administered 2018-03-11: 3.6 mg via SUBCUTANEOUS

## 2018-03-11 MED ORDER — GOSERELIN ACETATE 3.6 MG ~~LOC~~ IMPL
DRUG_IMPLANT | SUBCUTANEOUS | Status: AC
  Filled 2018-03-11: qty 3.6

## 2018-03-11 NOTE — Patient Instructions (Signed)
Goserelin injection What is this medicine? GOSERELIN (GOE se rel in) is similar to a hormone found in the body. It lowers the amount of sex hormones that the body makes. Men will have lower testosterone levels and women will have lower estrogen levels while taking this medicine. In men, this medicine is used to treat prostate cancer; the injection is either given once per month or once every 12 weeks. A once per month injection (only) is used to treat women with endometriosis, dysfunctional uterine bleeding, or advanced breast cancer. This medicine may be used for other purposes; ask your health care provider or pharmacist if you have questions. COMMON BRAND NAME(S): Zoladex What should I tell my health care provider before I take this medicine? They need to know if you have any of these conditions (some only apply to women): -diabetes -heart disease or previous heart attack -high blood pressure -high cholesterol -kidney disease -osteoporosis or low bone density -problems passing urine -spinal cord injury -stroke -tobacco smoker -an unusual or allergic reaction to goserelin, hormone therapy, other medicines, foods, dyes, or preservatives -pregnant or trying to get pregnant -breast-feeding How should I use this medicine? This medicine is for injection under the skin. It is given by a health care professional in a hospital or clinic setting. Men receive this injection once every 4 weeks or once every 12 weeks. Women will only receive the once every 4 weeks injection. Talk to your pediatrician regarding the use of this medicine in children. Special care may be needed. Overdosage: If you think you have taken too much of this medicine contact a poison control center or emergency room at once. NOTE: This medicine is only for you. Do not share this medicine with others. What if I miss a dose? It is important not to miss your dose. Call your doctor or health care professional if you are unable to  keep an appointment. What may interact with this medicine? -female hormones like estrogen -herbal or dietary supplements like black cohosh, chasteberry, or DHEA -female hormones like testosterone -prasterone This list may not describe all possible interactions. Give your health care provider a list of all the medicines, herbs, non-prescription drugs, or dietary supplements you use. Also tell them if you smoke, drink alcohol, or use illegal drugs. Some items may interact with your medicine. What should I watch for while using this medicine? Visit your doctor or health care professional for regular checks on your progress. Your symptoms may appear to get worse during the first weeks of this therapy. Tell your doctor or healthcare professional if your symptoms do not start to get better or if they get worse after this time. Your bones may get weaker if you take this medicine for a long time. If you smoke or frequently drink alcohol you may increase your risk of bone loss. A family history of osteoporosis, chronic use of drugs for seizures (convulsions), or corticosteroids can also increase your risk of bone loss. Talk to your doctor about how to keep your bones strong. This medicine should stop regular monthly menstration in women. Tell your doctor if you continue to menstrate. Women should not become pregnant while taking this medicine or for 12 weeks after stopping this medicine. Women should inform their doctor if they wish to become pregnant or think they might be pregnant. There is a potential for serious side effects to an unborn child. Talk to your health care professional or pharmacist for more information. Do not breast-feed an infant while taking   this medicine. Men should inform their doctors if they wish to father a child. This medicine may lower sperm counts. Talk to your health care professional or pharmacist for more information. What side effects may I notice from receiving this  medicine? Side effects that you should report to your doctor or health care professional as soon as possible: -allergic reactions like skin rash, itching or hives, swelling of the face, lips, or tongue -bone pain -breathing problems -changes in vision -chest pain -feeling faint or lightheaded, falls -fever, chills -pain, swelling, warmth in the leg -pain, tingling, numbness in the hands or feet -signs and symptoms of low blood pressure like dizziness; feeling faint or lightheaded, falls; unusually weak or tired -stomach pain -swelling of the ankles, feet, hands -trouble passing urine or change in the amount of urine -unusually high or low blood pressure -unusually weak or tired Side effects that usually do not require medical attention (report to your doctor or health care professional if they continue or are bothersome): -change in sex drive or performance -changes in breast size in both males and females -changes in emotions or moods -headache -hot flashes -irritation at site where injected -loss of appetite -skin problems like acne, dry skin -vaginal dryness This list may not describe all possible side effects. Call your doctor for medical advice about side effects. You may report side effects to FDA at 1-800-FDA-1088. Where should I keep my medicine? This drug is given in a hospital or clinic and will not be stored at home. NOTE: This sheet is a summary. It may not cover all possible information. If you have questions about this medicine, talk to your doctor, pharmacist, or health care provider.  2018 Elsevier/Gold Standard (2013-05-25 11:10:35)  

## 2018-03-12 ENCOUNTER — Other Ambulatory Visit: Payer: Self-pay

## 2018-03-12 ENCOUNTER — Ambulatory Visit (HOSPITAL_COMMUNITY): Payer: Medicare HMO | Attending: Cardiovascular Disease

## 2018-03-12 DIAGNOSIS — R0602 Shortness of breath: Secondary | ICD-10-CM | POA: Diagnosis not present

## 2018-03-13 ENCOUNTER — Telehealth: Payer: Self-pay | Admitting: Cardiology

## 2018-03-13 ENCOUNTER — Other Ambulatory Visit (INDEPENDENT_AMBULATORY_CARE_PROVIDER_SITE_OTHER): Payer: Medicare HMO

## 2018-03-13 ENCOUNTER — Encounter: Payer: Self-pay | Admitting: Physician Assistant

## 2018-03-13 ENCOUNTER — Ambulatory Visit (INDEPENDENT_AMBULATORY_CARE_PROVIDER_SITE_OTHER): Payer: Medicare HMO | Admitting: Physician Assistant

## 2018-03-13 ENCOUNTER — Encounter: Payer: Self-pay | Admitting: *Deleted

## 2018-03-13 ENCOUNTER — Other Ambulatory Visit: Payer: Self-pay

## 2018-03-13 VITALS — BP 160/100 | HR 104 | Ht 66.0 in | Wt 205.0 lb

## 2018-03-13 DIAGNOSIS — R140 Abdominal distension (gaseous): Secondary | ICD-10-CM

## 2018-03-13 DIAGNOSIS — R10817 Generalized abdominal tenderness: Secondary | ICD-10-CM

## 2018-03-13 DIAGNOSIS — R7989 Other specified abnormal findings of blood chemistry: Secondary | ICD-10-CM

## 2018-03-13 DIAGNOSIS — K3184 Gastroparesis: Secondary | ICD-10-CM

## 2018-03-13 DIAGNOSIS — R1084 Generalized abdominal pain: Secondary | ICD-10-CM

## 2018-03-13 DIAGNOSIS — K219 Gastro-esophageal reflux disease without esophagitis: Secondary | ICD-10-CM | POA: Diagnosis not present

## 2018-03-13 DIAGNOSIS — R112 Nausea with vomiting, unspecified: Secondary | ICD-10-CM

## 2018-03-13 LAB — LIPASE: LIPASE: 21 U/L (ref 11.0–59.0)

## 2018-03-13 LAB — CBC WITH DIFFERENTIAL/PLATELET
Basophils Absolute: 0.1 10*3/uL (ref 0.0–0.1)
Basophils Relative: 1.2 % (ref 0.0–3.0)
Eosinophils Absolute: 0.1 10*3/uL (ref 0.0–0.7)
Eosinophils Relative: 1.4 % (ref 0.0–5.0)
HCT: 41.6 % (ref 36.0–46.0)
Hemoglobin: 14 g/dL (ref 12.0–15.0)
Lymphocytes Relative: 27 % (ref 12.0–46.0)
Lymphs Abs: 1.8 10*3/uL (ref 0.7–4.0)
MCHC: 33.6 g/dL (ref 30.0–36.0)
MCV: 88.2 fl (ref 78.0–100.0)
MONO ABS: 0.4 10*3/uL (ref 0.1–1.0)
Monocytes Relative: 6.8 % (ref 3.0–12.0)
Neutro Abs: 4.2 10*3/uL (ref 1.4–7.7)
Neutrophils Relative %: 63.6 % (ref 43.0–77.0)
Platelets: 215 10*3/uL (ref 150.0–400.0)
RBC: 4.72 Mil/uL (ref 3.87–5.11)
RDW: 14.9 % (ref 11.5–15.5)
WBC: 6.6 10*3/uL (ref 4.0–10.5)

## 2018-03-13 LAB — COMPREHENSIVE METABOLIC PANEL
ALT: 27 U/L (ref 0–35)
AST: 21 U/L (ref 0–37)
Albumin: 3.9 g/dL (ref 3.5–5.2)
Alkaline Phosphatase: 61 U/L (ref 39–117)
BUN: 12 mg/dL (ref 6–23)
CO2: 26 mEq/L (ref 19–32)
CREATININE: 0.91 mg/dL (ref 0.40–1.20)
Calcium: 9.4 mg/dL (ref 8.4–10.5)
Chloride: 105 mEq/L (ref 96–112)
GFR: 86.18 mL/min (ref >60.00)
Glucose, Bld: 108 mg/dL — ABNORMAL HIGH (ref 70–99)
Potassium: 3.3 mEq/L — ABNORMAL LOW (ref 3.5–5.1)
Sodium: 141 mEq/L (ref 135–145)
Total Bilirubin: 0.3 mg/dL (ref 0.2–1.2)
Total Protein: 7.2 g/dL (ref 6.0–8.3)

## 2018-03-13 MED ORDER — PROMETHAZINE HCL 25 MG RE SUPP: 25.0000 mg | Freq: Four times a day (QID) | RECTAL | 1 refills | Status: DC | PRN

## 2018-03-13 MED ORDER — POTASSIUM CHLORIDE ER 20 MEQ PO TBCR: 40.0000 meq | EXTENDED_RELEASE_TABLET | Freq: Every day | ORAL | 0 refills | Status: DC

## 2018-03-13 NOTE — Patient Instructions (Addendum)
If you are age 44 or older, your body mass index should be between 23-30. Your Body mass index is 33.09 kg/m. If this is out of the aforementioned range listed, please consider follow up with your Primary Care Provider.  If you are age 67 or younger, your body mass index should be between 19-25. Your Body mass index is 33.09 kg/m. If this is out of the aformentioned range listed, please consider follow up with your Primary Care Provider.   You have been scheduled for an endoscopy. Please follow written instructions given to you at your visit today. If you use inhalers (even only as needed), please bring them with you on the day of your procedure. Your physician has requested that you go to www.startemmi.com and enter the access code given to you at your visit today. This web site gives a general overview about your procedure. However, you should still follow specific instructions given to you by our office regarding your preparation for the procedure.  You have been scheduled for an abdominal ultrasound at [facility] (1st floor of hospital) on 03/18/18 at 10:30 am. Please arrive 15 minutes prior to your appointment for registration. Make certain not to have anything to eat or drink after midnight the day prior to your appointment. Should you need to reschedule your appointment, please contact radiology at [phone]. This test typically takes about 30 minutes to perform.  Your provider has requested that you go to the basement level for lab work before leaving today. Press "B" on the elevator. The lab is located at the first door on the left as you exit the elevator.  We have sent the following medications to your pharmacy for you to pick up at your convenience: Promethazine suppositories - use in place of oral phenergan NOT WITH.  Thank you for choosing me and [facility].   Ellouise Newer, PA-C

## 2018-03-13 NOTE — Telephone Encounter (Signed)
Spoke with the patient, she expressed understanding and accepted to repeat BMET on 1/6. She had no further questions.

## 2018-03-13 NOTE — Telephone Encounter (Signed)
Notes recorded by Daune Perch, NP on 03/10/2018 at 1:03 PM EST Kidney function is mildly impaired. Be sure to drink enough water, ~6-8 cups per day. Potassium is normal. Repeat BMet in 3 weeks.   Daune Perch, NP

## 2018-03-13 NOTE — Progress Notes (Addendum)
Chief Complaint: Abdominal pain, nausea and bloating  HPI:    Mrs. Zappulla is a 42 year old female with a past medical history as listed below including current chemotherapy for breast cancer, pseudotumor cerebri status post VP shunt, peripheral neuropathy, reflux and obesity,, follows Dr. Hilarie Fredrickson for gastroparesis, who was referred to me by [name], MD for a complaint of abdominal pain, nausea and bloating.      04/07/2017 office visit with Dr. Hilarie Fredrickson to discuss gastroparesis.  At that time, she discussed issues with severe hypertension and was seeing Dr. Meda Coffee with titration of blood pressure medications.  Her GI symptoms were stable.  She had nausea associated with her gastroparesis despite Reglan 10 mg 3 times a day, rarely vomited.  She did use Phenergan for nausea but noticed needing higher doses taking 25-37.5 mg for relief.  Zofran was ineffective.  Bowel movements regular.  She was taking Dexilant 60 mg daily but still with occasional heartburn particularly at night.  Her Phenergan was increased slightly to 25 mg 3 times a day as needed.  Continued on Dexilant 60 mg daily and Ranitidine 150 mg was added in the evening.  She was continued on Metoclopramide 3 times daily.    Today, patient explains that she has noticed that her stomach is becoming more and more swollen over the past 2 weeks.  Within the past week she notes she has gained at least 10 pounds.  Tells me that typically after getting her chemo injections every 28 days she does note some increased bloating but this is even abnormal for that.  Along with this she has also had an increase of gastroparesis symptoms noting that she is nauseous constantly now which is only slightly abated by Phenergan 25 mg, sometimes 3 tabs at a time.  Also has increased reflux symptoms which are worse at night regardless of her Dexilant 60 mg daily and Ranitidine 300 mg in the evening.  Has continued her Metoclopramide 3 times a day.  Overall the patient  just "knows something is wrong".  Patient feels ill.    Denies fever, chills, anorexia or symptoms that awaken her from sleep.     Past Medical History:  Diagnosis Date  . Asthma   . Breast cancer ([place]) 2010   a. locally advanced left breast carcinoma diagnosed in 2010 and treated with neoadjuvant chemotherapy with docetaxel and cyclophosphamide as well as paclitaxel. This was followed by radiation therapy which was completed in 2011.  Marland Kitchen Dyspnea on exertion    a. 01/2013 Lexi MV: EF 54%, no ischemia/infarct;  b. 05/2013 Echo: EF 60-65%, no rwma, Gr 2 DD;  b.   . Fatty liver   . Gastroparesis   . GERD (gastroesophageal reflux disease)   . Hypertension   . Impaired glucose tolerance 04/13/2011  . Lymphadenitis, chronic    restricted LEFT extremity  . 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 OSA (obstructive sleep apnea) 05/04/2014   AHI 31/hr now on CPAP at 12cm H2O  . Personal history of chemotherapy 2010  . Personal history of radiation therapy 2010    Past Surgical History:  Procedure Laterality Date  . BRAIN SURGERY     Shunt  . BREAST LUMPECTOMY Left 09/2008  . CARPAL TUNNEL RELEASE Left 2011  . LYMPH NODE DISSECTION Left 2010   breast; 2 wks after breast lumpectomy  . TENDON RELEASE Right 2012   "hand"  . TUBAL LIGATION  05/11/2011   Procedure: POST PARTUM TUBAL LIGATION;  Surgeon: Emeterio Reeve, MD;  Location: [facility];  Service: Gynecology;  Laterality: Bilateral;  Induced for HTN    Current Outpatient Medications  Medication Sig Dispense Refill  . acetaminophen-codeine (TYLENOL #4) 300-60 MG tablet Take 1 tablet by mouth every 4 (four) hours as needed for pain. 40 tablet 0  . acetaZOLAMIDE (DIAMOX) 250 MG tablet TAKE 4 TABLETS BY MOUTH 3 (THREE) TIMES DAILY. 1080 tablet 1  . albuterol (PROVENTIL) (2.5 MG/3ML) 0.083% nebulizer solution USE 1 VIAL VIA NEBULIZER EVERY 4 HOURS AND AS NEEDED FOR WHEEZING OR SHORTNESS OF BREATH 75 mL 1  . amitriptyline (ELAVIL) 100 MG tablet Take  100 mg by mouth at bedtime.    Marland Kitchen amLODipine (NORVASC) 10 MG tablet Take 1 tablet (10 mg total) by mouth daily. 90 tablet 0  . Biotin 10000 MCG TABS Take 1 tablet by mouth daily.    . budesonide-formoterol (SYMBICORT) 160-4.5 MCG/ACT inhaler INHALE 2 PUFFS INTO LUNGS 2 TIMES DAILY AS DIRECTED    . carvedilol (COREG) 6.25 MG tablet TAKE 1 TABLET BY MOUTH TWICE A DAY 180 tablet 0  . chlorpheniramine (CHLOR-TRIMETON) 4 MG tablet 2 tabs by mouth at bedtime.  May take 1 extra every 4 hours as needed    . chlorthalidone (HYGROTON) 25 MG tablet TAKE 1 TABLET (25 MG TOTAL) DAILY BY MOUTH. 90 tablet 0  . cloNIDine (CATAPRES) 0.3 MG tablet TAKE 1 TABLET BY MOUTH TWICE A DAY 180 tablet 3  . clotrimazole (MYCELEX) 10 MG troche Take 1 tablet (10 mg total) by mouth 5 (five) times daily. 30 tablet 11  . dexlansoprazole (DEXILANT) 60 MG capsule Take 1 capsule (60 mg total) by mouth daily. 90 capsule 3  . dextromethorphan-guaiFENesin (MUCINEX DM) 30-600 MG 12hr tablet Take 1 tablet by mouth 2 (two) times daily.     Marland Kitchen dicyclomine (BENTYL) 20 MG tablet TAKE 1 TABLET BY MOUTH 3 TIMES DAILY AS NEEDED FOR SPASMS 90 tablet 1  . diltiazem (CARDIZEM LA) 420 MG 24 hr tablet Take 1 tablet (420 mg total) by mouth daily. 90 tablet 0  . DULoxetine (CYMBALTA) 30 MG capsule Take 1 capsule (30 mg total) by mouth daily. With 60mg  for total of 90 mg 30 capsule 1  . DULoxetine (CYMBALTA) 60 MG capsule Take 1 capsule (60 mg total) by mouth daily. For total of 90 mg 30 capsule 1  . EPINEPHRINE 0.3 mg/0.3 mL IJ SOAJ injection INJECT 0.3 MLS INTO THE MUSCLE ONCE AS NEEDED FOR ANAPHYLAXIS 2 Device 2  . furosemide (LASIX) 40 MG tablet TAKE 1 TABLET BY MOUTH TWICE A DAY 180 tablet 0  . hydrALAZINE (APRESOLINE) 100 MG tablet TAKE 1 TABLET (100 MG TOTAL) BY MOUTH 3 (THREE) TIMES DAILY. 270 tablet 2  . lidocaine-prilocaine (EMLA) cream Apply 1 application topically. Apply 1 application to skin ever 28 days    . LORazepam (ATIVAN) 0.5 MG tablet  Take 0.5 mg by mouth every 8 (eight) hours.    . metoCLOPramide (REGLAN) 10 MG tablet TAKE 1 TABLET BY MOUTH 4 TIMES A DAY WITH MEALS AND AT BEDTIME 360 tablet 3  . metoprolol tartrate (LOPRESSOR) 100 MG tablet Take 1 tablet (100 mg total) by mouth as directed. Take 1 tablet 1 hour before your test 1 tablet 0  . montelukast (SINGULAIR) 10 MG tablet TAKE 1 TABLET BY MOUTH ONCE DAILY AT BEDTIME 30 tablet 5  . NITROSTAT 0.4 MG SL tablet PLACE 1 TABLET UNDER TONGUE EVERY 5 MINUTES AS NEEDED FOR CHEST PAIN, MAX  OF 3 TABLETS THEN CALL 911 IF PAIN PERSISTS 100 tablet 0  . OXYGEN Use CPAP at bedtime    . oxymetazoline (MUCINEX NASAL SPRAY FULL FORCE) 0.05 % nasal spray Place 1 spray into both nostrils 2 (two) times daily.    . potassium chloride SA (K-DUR,KLOR-CON) 20 MEQ tablet Take 20 mEq by mouth 2 (two) times daily.    . primidone (MYSOLINE) 50 MG tablet TAKE 0.5 TABLETS (25 MG TOTAL) BY MOUTH AT BEDTIME. 45 tablet 1  . promethazine (PHENERGAN) 25 MG tablet Take 1 tablet (25 mg total) by mouth 3 (three) times daily as needed for nausea or vomiting. 270 tablet 1  . ranitidine (ZANTAC) 150 MG tablet Take 1 tablet (150 mg total) by mouth 2 (two) times daily. 180 tablet 3  . ranolazine (RANEXA) 500 MG 12 hr tablet TAKE 1 TABLET BY MOUTH TWICE A DAY 180 tablet 0  . Respiratory Therapy Supplies (FLUTTER) DEVI Use as directed 1 each 0  . Spacer/Aero-Holding Chambers (AEROCHAMBER MV) inhaler Use as instructed 1 each 0  . spironolactone (ALDACTONE) 100 MG tablet TAKE 1 TABLET BY MOUTH EVERY DAY 90 tablet 0  . SYMBICORT 160-4.5 MCG/ACT inhaler INHALE 2 PUFFS INTO LUNGS 2 TIMES DAILY AS DIRECTED 30.6 Inhaler 1  . tapentadol HCl (NUCYNTA) 75 MG tablet Take 1 tablet (75 mg total) every 6 (six) hours as needed by mouth. 120 tablet 0  . topiramate (TOPAMAX) 25 MG tablet Take 25 mg by mouth 2 (two) times daily.    . traZODone (DESYREL) 150 MG tablet TAKE 1 TABLET BY MOUTH AT BEDTIME 90 tablet 1  . valsartan (DIOVAN)  160 MG tablet Take 1 tablet (160 mg total) by mouth daily. 30 tablet 11  . VENTOLIN HFA 108 (90 Base) MCG/ACT inhaler INHALE 2 PUFFS BY MOUTH EVERY 4 HOURS AS NEEDED FOR WHEEZE OR FOR SHORTNESS OF BREATH 18 Inhaler 1   No current facility-administered medications for this visit.     Allergies as of 03/13/2018 - Review Complete 03/13/2018  Allergen Reaction Noted  . Aspirin Shortness Of Breath and Palpitations 09/27/2010  . Doxycycline Anaphylaxis 01/29/2014  . Kiwi extract Itching and Swelling 01/19/2014  . Penicillins Shortness Of Breath and Palpitations 09/27/2010  . Strawberry extract Hives 01/19/2014  . Sulfamethoxazole-trimethoprim Anaphylaxis 12/15/2015    Family History  Problem Relation Age of Onset  . Other Mother 34       breast calcifications treated with surgery, breast cancer pill, and radiation  . Fibroids Sister 6       s/p TAH-BSO  . Diabetes Maternal Grandmother   . Heart disease Maternal Grandmother   . Hypertension Maternal Grandmother   . Breast cancer Other        maternal great aunt (MGM's sister)  . Breast cancer Other        paternal great aunt dx middle ages; s/p mastectomy  . Cancer Neg Hx   . Alcohol abuse Neg Hx   . Early death Neg Hx   . Hyperlipidemia Neg Hx   . Kidney disease Neg Hx   . Stroke Neg Hx   . Colon cancer Neg Hx     Social History   Socioeconomic History  . Marital status: Single    Spouse name: Not on file  . Number of children: 3  . Years of education: Not on file  . Highest education level: Not on file  Occupational History    Employer: UNEMPLOYED  Social Needs  . Financial resource strain:  Not on file  . Food insecurity:    Worry: Not on file    Inability: Not on file  . Transportation needs:    Medical: Not on file    Non-medical: Not on file  Tobacco Use  . Smoking status: Never Smoker  . Smokeless tobacco: Never Used  Substance and Sexual Activity  . Alcohol use: Yes    Alcohol/week: 0.0 standard drinks     Comment: occasionally/socially  . Drug use: No  . Sexual activity: Yes    Birth control/protection: Surgical  Lifestyle  . Physical activity:    Days per week: Not on file    Minutes per session: Not on file  . Stress: Not on file  Relationships  . Social connections:    Talks on phone: Not on file    Gets together: Not on file    Attends religious service: Not on file    Active member of club or organization: Not on file    Attends meetings of clubs or organizations: Not on file    Relationship status: Not on file  . Intimate partner violence:    Fear of current or ex partner: Not on file    Emotionally abused: Not on file    Physically abused: Not on file    Forced sexual activity: Not on file  Other Topics Concern  . Not on file  Social History Narrative   She lives with four children.   She is currently not working (disbaility pending).       Review of Systems:    Constitutional: No fever or chills Cardiovascular: No chest pain  Respiratory: No SOB  Gastrointestinal: See HPI and otherwise negative   Physical Exam:  Vital signs: BP (!) 160/100   Pulse (!) 104   Ht 5\' 6"  (1.676 m)   Wt 205 lb (93 kg)   BMI 33.09 kg/m   Constitutional:   Very pleasant AA female appears to be in NAD, Well developed, Well nourished, alert and cooperative Respiratory: Respirations even and unlabored. Lungs clear to auscultation bilaterally.   No wheezes, crackles, or rhonchi.  Cardiovascular: Normal S1, S2. No MRG. Regular rate and rhythm. No peripheral edema, cyanosis or pallor.  Gastrointestinal:  Soft, moderate distension,generalized mild ttp, No rebound or guarding. Normal bowel sounds. No appreciable masses or hepatomegaly. Rectal:  Not performed.  Psychiatric: Demonstrates good judgement and reason without abnormal affect or behaviors.  MOST LABS AND IMAGING: CBC    Component Value Date/Time   WBC 5.9 11/19/2017 1040   WBC 5.6 09/24/2017 1027   RBC 4.99 11/19/2017 1040    HGB 14.6 11/19/2017 1040   HGB 13.8 03/17/2017 0835   HCT 43.9 11/19/2017 1040   HCT 41.8 03/17/2017 0835   PLT 192 11/19/2017 1040   PLT 191 03/17/2017 0835   MCV 88.0 11/19/2017 1040   MCV 88.0 03/17/2017 0835   MCH 29.2 11/19/2017 1040   MCHC 33.2 11/19/2017 1040   RDW 15.1 (H) 11/19/2017 1040   RDW 14.5 03/17/2017 0835   LYMPHSABS 1.8 11/19/2017 1040   LYMPHSABS 1.6 03/17/2017 0835   MONOABS 0.4 11/19/2017 1040   MONOABS 0.3 03/17/2017 0835   EOSABS 0.1 11/19/2017 1040   EOSABS 0.1 03/17/2017 0835   BASOSABS 0.0 11/19/2017 1040   BASOSABS 0.1 03/17/2017 0835    CMP     Component Value Date/Time   NA 145 (H) 03/09/2018 1144   NA 142 03/17/2017 0835   K 4.2 03/09/2018 1144   K  3.6 03/17/2017 0835   CL 102 03/09/2018 1144   CL 103 09/10/2012 1618   CO2 26 03/09/2018 1144   CO2 27 03/17/2017 0835   GLUCOSE 87 03/09/2018 1144   GLUCOSE 106 (H) 11/19/2017 1040   GLUCOSE 114 03/17/2017 0835   GLUCOSE 86 09/10/2012 1618   BUN 15 03/09/2018 1144   BUN 10.1 03/17/2017 0835   CREATININE 1.21 (H) 03/09/2018 1144   CREATININE 1.03 (H) 11/19/2017 1040   CREATININE 0.9 03/17/2017 0835   CALCIUM 9.6 03/09/2018 1144   CALCIUM 9.5 03/17/2017 0835   PROT 7.9 11/19/2017 1040   PROT 7.7 03/17/2017 0835   ALBUMIN 3.7 11/19/2017 1040   ALBUMIN 3.7 03/17/2017 0835   AST 24 11/19/2017 1040   AST 17 03/17/2017 0835   ALT 35 11/19/2017 1040   ALT 24 03/17/2017 0835   ALKPHOS 82 11/19/2017 1040   ALKPHOS 77 03/17/2017 0835   BILITOT 0.5 11/19/2017 1040   BILITOT 0.50 03/17/2017 0835   GFRNONAA 55 (L) 03/09/2018 1144   GFRNONAA >60 11/19/2017 1040   GFRAA 63 03/09/2018 1144   GFRAA >60 11/19/2017 1040    Assessment: 1.  Gastroparesis: With increased nausea and vomiting as well as reflux over the past 2 to 4 weeks, this is regardless of all of her maintenance medications, also with increased abdominal distention; uncertain etiology, could be worsening gastroparesis versus ascites  versus other 2.  Increased abdominal distention: Question ascites versus bloating 3.  Generalized abdominal pain: With above  Plan: 1.  Ordered labs to include a CBC and CMP 2.  Ordered abdominal ultrasound for increasing abdominal distention and generalized abdominal pain. 3.  Scheduled patient for an EGD in the [facility] with Dr. Hilarie Fredrickson.  Did discuss risks, benefits, limitations and alternatives and patient agrees to proceed. 4.  Prescribed Phenergan suppositories 25 mg every 6 hours as needed #15 with a refill.  Explained that patient needs to take these instead of her oral Phenergan, not with it. 5.  Patient to follow in clinic per recommendations from Dr. Hilarie Fredrickson after labs and imaging above.  Ellouise Newer, PA-C [facility] 03/13/2018, 10:23 AM  Cc: [name], MD   Addendum: Reviewed and agree with assessment and management plan. Pyrtle, Lajuan Lines, MD

## 2018-03-13 NOTE — Telephone Encounter (Signed)
New message   Patient is calling for test results.

## 2018-03-17 ENCOUNTER — Encounter: Payer: Self-pay | Admitting: Internal Medicine

## 2018-03-17 ENCOUNTER — Telehealth: Payer: Self-pay | Admitting: Cardiology

## 2018-03-17 NOTE — Telephone Encounter (Signed)
I attempted to call pt regarding echo results but no answer and no VM available.

## 2018-03-18 ENCOUNTER — Ambulatory Visit (HOSPITAL_COMMUNITY)
Admission: RE | Admit: 2018-03-18 | Discharge: 2018-03-18 | Disposition: A | Discharge status: Home / Self Care | Payer: Medicare HMO | Source: Ambulatory Visit | Attending: Physician Assistant | Admitting: Physician Assistant

## 2018-03-18 DIAGNOSIS — R1084 Generalized abdominal pain: Secondary | ICD-10-CM | POA: Diagnosis not present

## 2018-03-18 DIAGNOSIS — R140 Abdominal distension (gaseous): Secondary | ICD-10-CM

## 2018-03-18 DIAGNOSIS — N281 Cyst of kidney, acquired: Secondary | ICD-10-CM | POA: Insufficient documentation

## 2018-03-18 DIAGNOSIS — K824 Cholesterolosis of gallbladder: Secondary | ICD-10-CM | POA: Diagnosis not present

## 2018-03-26 ENCOUNTER — Ambulatory Visit: Payer: Medicaid - Out of State

## 2018-03-26 NOTE — Progress Notes (Deleted)
Patient ID: Erica [name]                 DOB: 10-08-1975                      MRN: [id number]     HPI: Erica [name] is a 42 y.o. female patient of Dr. Meda Coffee who presents today for hypertension follow up. PMH significant for CHF, HTN, idiopathic intracranial hypertension, asthma, sleep apnea, obesity and hypertriglyceridemia. She also has history of recurrent syncope. The cardio monitor showed only sinus rhythm. 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 Sleep studyshowed severe sleep apnea but she is noncompliant with CPap machine - per patient she is considered noncompliant because she never sleeps more than 4 hours at time. 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 She has had a renal artery scan on 10/13/15 that was negative. She also had a normal TSH in March of 2017. She had repeat RAS on 03/27/17 and no significant renal artery stenosis seen.  She was previously seen in HTN clinic in January and pressures remained uncontrolled. She did not follow up as scheduled. She was last seen on 12/9 by NP Daune Perch. At that visit she reported that her lisinopril was discontinued by her pulmonologist due to cough and changed to valsartan, however she had not yet picked it up. Echo was done which showed a decrease in EF to 45-50% and grade 2 diastolic dysfunction. She is scheduled for a coronary CT in a few weeks.  Compliance? CPAP Abdominal swelling? amlodopine Stop dilt   She reports that she has been taking Goserelin monthly. This medication is associated with hypertension in up to 6% of cases. Also of note more commonly this medication is associated with vasodilation.   She has an extensive history of elevated pressures persistently with now 10 medications to control.   Current HTN meds:  Carvedilol 6.25mg  BID  Furosemide 40mg  BID (630am and noon) Valsartan 160 mg daily Spironolactone 100mg  daily  Diltiazem 420mg  daily in  the morning Clonidine 0.3mg  BID - dries mouth Amlodipine 10mg  daily in the morning  Hydralazine 100mg  TID Chlorthalidone 25mg  daily   Previously tried: Imdur - worsening headaches, lisinopril -cough  BP goal: <130/90  Family History: She states that none of her close family members have known hypertension or other cardiac issues.   Social History: She denies tobacco and endorses only rare social alcohol consumption.   Diet:She eats most of her meals from home and prepares them without salt. She uses pepper and garlic to season. She does endorse 2-3 Pepsis per day and states that without these she becomes increasingly nauseated and the caffeine helps with her headaches.   Exercise:She tries to walk though she can barely make it 10 minutes without getting SOB. She states she tried P90x once with her daughters and had to use a breathing treatment.   Home BP readings: she reports at home 150-160/90s in last few weeks.   Wt Readings from Last 3 Encounters:  03/13/18 205 lb (93 kg)  03/09/18 204 lb 6.4 oz (92.7 kg)  03/06/18 199 lb 12.8 oz (90.6 kg)   BP Readings from Last 3 Encounters:  03/13/18 (!) 160/100  03/11/18 (!) 185/102  03/09/18 (!) 192/100   Pulse Readings from Last 3 Encounters:  03/13/18 (!) 104  03/11/18 (!) 106  03/09/18 (!) 105    Renal function: Estimated Creatinine Clearance: 90.7 mL/min (by C-G  formula based on SCr of 0.91 mg/dL).  Past Medical History:  Diagnosis Date  . Asthma   . Breast cancer ([place]) 2010   a. locally advanced left breast carcinoma diagnosed in 2010 and treated with neoadjuvant chemotherapy with docetaxel and cyclophosphamide as well as paclitaxel. This was followed by radiation therapy which was completed in 2011.  Marland Kitchen Dyspnea on exertion    a. 01/2013 Lexi MV: EF 54%, no ischemia/infarct;  b. 05/2013 Echo: EF 60-65%, no rwma, Gr 2 DD;  b.   . Fatty liver   . Gastroparesis   . GERD (gastroesophageal reflux disease)   . Hypertension    . Impaired glucose tolerance 04/13/2011  . Lymphadenitis, chronic    restricted LEFT extremity  . 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 OSA (obstructive sleep apnea) 05/04/2014   AHI 31/hr now on CPAP at 12cm H2O  . Personal history of chemotherapy 2010  . Personal history of radiation therapy 2010    Current Outpatient Medications on File Prior to Visit  Medication Sig Dispense Refill  . acetaminophen-codeine (TYLENOL #4) 300-60 MG tablet Take 1 tablet by mouth every 4 (four) hours as needed for pain. 40 tablet 0  . acetaZOLAMIDE (DIAMOX) 250 MG tablet TAKE 4 TABLETS BY MOUTH 3 (THREE) TIMES DAILY. 1080 tablet 1  . albuterol (PROVENTIL) (2.5 MG/3ML) 0.083% nebulizer solution USE 1 VIAL VIA NEBULIZER EVERY 4 HOURS AND AS NEEDED FOR WHEEZING OR SHORTNESS OF BREATH 75 mL 1  . amitriptyline (ELAVIL) 100 MG tablet Take 100 mg by mouth at bedtime.    Marland Kitchen amLODipine (NORVASC) 10 MG tablet Take 1 tablet (10 mg total) by mouth daily. 90 tablet 0  . Biotin 10000 MCG TABS Take 1 tablet by mouth daily.    . budesonide-formoterol (SYMBICORT) 160-4.5 MCG/ACT inhaler INHALE 2 PUFFS INTO LUNGS 2 TIMES DAILY AS DIRECTED    . carvedilol (COREG) 6.25 MG tablet TAKE 1 TABLET BY MOUTH TWICE A DAY 180 tablet 0  . chlorpheniramine (CHLOR-TRIMETON) 4 MG tablet 2 tabs by mouth at bedtime.  May take 1 extra every 4 hours as needed    . chlorthalidone (HYGROTON) 25 MG tablet TAKE 1 TABLET (25 MG TOTAL) DAILY BY MOUTH. 90 tablet 0  . cloNIDine (CATAPRES) 0.3 MG tablet TAKE 1 TABLET BY MOUTH TWICE A DAY 180 tablet 3  . clotrimazole (MYCELEX) 10 MG troche Take 1 tablet (10 mg total) by mouth 5 (five) times daily. 30 tablet 11  . dexlansoprazole (DEXILANT) 60 MG capsule Take 1 capsule (60 mg total) by mouth daily. 90 capsule 3  . dextromethorphan-guaiFENesin (MUCINEX DM) 30-600 MG 12hr tablet Take 1 tablet by mouth 2 (two) times daily.     Marland Kitchen dicyclomine (BENTYL) 20 MG tablet TAKE 1 TABLET BY MOUTH 3 TIMES DAILY AS  NEEDED FOR SPASMS 90 tablet 1  . diltiazem (CARDIZEM LA) 420 MG 24 hr tablet Take 1 tablet (420 mg total) by mouth daily. 90 tablet 0  . DULoxetine (CYMBALTA) 30 MG capsule Take 1 capsule (30 mg total) by mouth daily. With 60mg  for total of 90 mg 30 capsule 1  . DULoxetine (CYMBALTA) 60 MG capsule Take 1 capsule (60 mg total) by mouth daily. For total of 90 mg 30 capsule 1  . EPINEPHRINE 0.3 mg/0.3 mL IJ SOAJ injection INJECT 0.3 MLS INTO THE MUSCLE ONCE AS NEEDED FOR ANAPHYLAXIS 2 Device 2  . furosemide (LASIX) 40 MG tablet TAKE 1 TABLET BY MOUTH TWICE A DAY 180 tablet 0  .  hydrALAZINE (APRESOLINE) 100 MG tablet TAKE 1 TABLET (100 MG TOTAL) BY MOUTH 3 (THREE) TIMES DAILY. 270 tablet 2  . lidocaine-prilocaine (EMLA) cream Apply 1 application topically. Apply 1 application to skin ever 28 days    . LORazepam (ATIVAN) 0.5 MG tablet Take 0.5 mg by mouth every 8 (eight) hours.    . metoCLOPramide (REGLAN) 10 MG tablet TAKE 1 TABLET BY MOUTH 4 TIMES A DAY WITH MEALS AND AT BEDTIME 360 tablet 3  . metoprolol tartrate (LOPRESSOR) 100 MG tablet Take 1 tablet (100 mg total) by mouth as directed. Take 1 tablet 1 hour before your test 1 tablet 0  . montelukast (SINGULAIR) 10 MG tablet TAKE 1 TABLET BY MOUTH ONCE DAILY AT BEDTIME 30 tablet 5  . NITROSTAT 0.4 MG SL tablet PLACE 1 TABLET UNDER TONGUE EVERY 5 MINUTES AS NEEDED FOR CHEST PAIN, MAX OF 3 TABLETS THEN CALL 911 IF PAIN PERSISTS 100 tablet 0  . OXYGEN Use CPAP at bedtime    . oxymetazoline (MUCINEX NASAL SPRAY FULL FORCE) 0.05 % nasal spray Place 1 spray into both nostrils 2 (two) times daily.    . potassium chloride 20 MEQ TBCR Take 40 mEq by mouth daily for 1 day. 2 tablet 0  . potassium chloride SA (K-DUR,KLOR-CON) 20 MEQ tablet Take 20 mEq by mouth 2 (two) times daily.    . primidone (MYSOLINE) 50 MG tablet TAKE 0.5 TABLETS (25 MG TOTAL) BY MOUTH AT BEDTIME. 45 tablet 1  . promethazine (PHENERGAN) 25 MG suppository Place 1 suppository (25 mg total)  rectally every 6 (six) hours as needed for nausea or vomiting. 15 each 1  . promethazine (PHENERGAN) 25 MG tablet Take 1 tablet (25 mg total) by mouth 3 (three) times daily as needed for nausea or vomiting. 270 tablet 1  . ranitidine (ZANTAC) 150 MG tablet Take 1 tablet (150 mg total) by mouth 2 (two) times daily. 180 tablet 3  . ranolazine (RANEXA) 500 MG 12 hr tablet TAKE 1 TABLET BY MOUTH TWICE A DAY 180 tablet 0  . Respiratory Therapy Supplies (FLUTTER) DEVI Use as directed 1 each 0  . Spacer/Aero-Holding Chambers (AEROCHAMBER MV) inhaler Use as instructed 1 each 0  . spironolactone (ALDACTONE) 100 MG tablet TAKE 1 TABLET BY MOUTH EVERY DAY 90 tablet 0  . SYMBICORT 160-4.5 MCG/ACT inhaler INHALE 2 PUFFS INTO LUNGS 2 TIMES DAILY AS DIRECTED 30.6 Inhaler 1  . tapentadol HCl (NUCYNTA) 75 MG tablet Take 1 tablet (75 mg total) every 6 (six) hours as needed by mouth. 120 tablet 0  . topiramate (TOPAMAX) 25 MG tablet Take 25 mg by mouth 2 (two) times daily.    . traZODone (DESYREL) 150 MG tablet TAKE 1 TABLET BY MOUTH AT BEDTIME 90 tablet 1  . valsartan (DIOVAN) 160 MG tablet Take 1 tablet (160 mg total) by mouth daily. 30 tablet 11  . VENTOLIN HFA 108 (90 Base) MCG/ACT inhaler INHALE 2 PUFFS BY MOUTH EVERY 4 HOURS AS NEEDED FOR WHEEZE OR FOR SHORTNESS OF BREATH 18 Inhaler 1   No current facility-administered medications on file prior to visit.     Allergies  Allergen Reactions  . Aspirin Shortness Of Breath and Palpitations    Pt can take ibuprofen without reaction  . Doxycycline Anaphylaxis  . Kiwi Extract Itching and Swelling    Lip swelling  . Penicillins Shortness Of Breath and Palpitations    Has patient had a PCN reaction causing immediate rash, facial/tongue/throat swelling, SOB or lightheadedness with  hypotension: Yes Has patient had a PCN reaction causing severe rash involving mucus membranes or skin necrosis: Yes Has patient had a PCN reaction that required hospitalization Yes Has  patient had a PCN reaction occurring within the last 10 years: No If all of the above answers are "NO", then may proceed with Cephalosporin use.  . Strawberry Extract Hives  . Sulfamethoxazole-Trimethoprim Anaphylaxis    Facial, throat and tongue swelling with difficulty swallowing.    There were no vitals taken for this visit.   Assessment/Plan: Hypertension:  Titrate diovan Could consider titrating carvediolol depending on pluse and abdominal distension/SOB  Thank you,  Ilene Witcher D Gwen Edler, Pharm.D, [facility]  [address] [place], [place] [address]  Phone: [phone]; Fax: [phone]   03/26/2018 7:26 AM

## 2018-03-27 ENCOUNTER — Ambulatory Visit (AMBULATORY_SURGERY_CENTER): Payer: Medicare HMO | Admitting: Internal Medicine

## 2018-03-27 ENCOUNTER — Encounter: Payer: Self-pay | Admitting: Internal Medicine

## 2018-03-27 VITALS — BP 177/106 | HR 84 | Temp 97.7°F | Resp 25 | Ht 66.0 in | Wt 205.0 lb

## 2018-03-27 DIAGNOSIS — R109 Unspecified abdominal pain: Secondary | ICD-10-CM | POA: Diagnosis not present

## 2018-03-27 DIAGNOSIS — R1013 Epigastric pain: Secondary | ICD-10-CM

## 2018-03-27 DIAGNOSIS — R140 Abdominal distension (gaseous): Secondary | ICD-10-CM

## 2018-03-27 DIAGNOSIS — K219 Gastro-esophageal reflux disease without esophagitis: Secondary | ICD-10-CM | POA: Diagnosis not present

## 2018-03-27 MED ORDER — SODIUM CHLORIDE 0.9 % IV SOLN: 500.0000 mL | Freq: Once | INTRAVENOUS | Status: DC

## 2018-03-27 NOTE — Progress Notes (Signed)
PT taken to PACU. Monitors in place. VSS. Report given to RN. 

## 2018-03-27 NOTE — Patient Instructions (Signed)
Dr. Hilarie Fredrickson will schedule MRI with contrast   YOU HAD AN ENDOSCOPIC PROCEDURE TODAY AT [facility]:   Refer to the procedure report that was given to you for any specific questions about what was found during the examination.  If the procedure report does not answer your questions, please call your gastroenterologist to clarify.  If you requested that your care partner not be given the details of your procedure findings, then the procedure report has been included in a sealed envelope for you to review at your convenience later.  YOU SHOULD EXPECT: Some feelings of bloating in the abdomen. Passage of more gas than usual.  Walking can help get rid of the air that was put into your GI tract during the procedure and reduce the bloating. If you had a lower endoscopy (such as a colonoscopy or flexible sigmoidoscopy) you may notice spotting of blood in your stool or on the toilet paper. If you underwent a bowel prep for your procedure, you may not have a normal bowel movement for a few days.  Please Note:  You might notice some irritation and congestion in your nose or some drainage.  This is from the oxygen used during your procedure.  There is no need for concern and it should clear up in a day or so.  SYMPTOMS TO REPORT IMMEDIATELY:    Following upper endoscopy (EGD)  Vomiting of blood or coffee ground material  New chest pain or pain under the shoulder blades  Painful or persistently difficult swallowing  New shortness of breath  Fever of 100F or higher  Black, tarry-looking stools  For urgent or emergent issues, a gastroenterologist can be reached at any hour by calling [phone].   DIET:  We do recommend a small meal at first, but then you may proceed to your regular diet.  Drink plenty of fluids but you should avoid alcoholic beverages for 24 hours.  ACTIVITY:  You should plan to take it easy for the rest of today and you should NOT DRIVE or use heavy machinery until  tomorrow (because of the sedation medicines used during the test).    FOLLOW UP: Our staff will call the number listed on your records the next business day following your procedure to check on you and address any questions or concerns that you may have regarding the information given to you following your procedure. If we do not reach you, we will leave a message.  However, if you are feeling well and you are not experiencing any problems, there is no need to return our call.  We will assume that you have returned to your regular daily activities without incident.  If any biopsies were taken you will be contacted by phone or by letter within the next 1-3 weeks.  Please call us at [phone] if you have not heard about the biopsies in 3 weeks.    SIGNATURES/CONFIDENTIALITY: You and/or your care partner have signed paperwork which will be entered into your electronic medical record.  These signatures attest to the fact that that the information above on your After Visit Summary has been reviewed and is understood.  Full responsibility of the confidentiality of this discharge information lies with you and/or your care-partner.

## 2018-03-27 NOTE — Progress Notes (Signed)
Pt's states no medical or surgical changes since previsit or office visit. 

## 2018-03-27 NOTE — Progress Notes (Signed)
Called to room to assist during endoscopic procedure.  Patient ID and intended procedure confirmed with present staff. Received instructions for my participation in the procedure from the performing physician.  

## 2018-03-27 NOTE — Progress Notes (Deleted)
Called to room to assist during endoscopic procedure.  Patient ID and intended procedure confirmed with present staff. Received instructions for my participation in the procedure from the performing physician.  

## 2018-03-27 NOTE — Op Note (Signed)
[name] Procedure Date: 03/27/2018 3:55 PM MRN: [id number] Endoscopist: Jerene Bears , MD Age: 42 Referring MD:  Date of Birth: Aug 16, 1975 Gender: Female Account #: [id number] Procedure:                Upper GI endoscopy Indications:              Epigastric abdominal pain Medicines:                Monitored Anesthesia Care Procedure:                Pre-Anesthesia Assessment:                           - Prior to the procedure, a History and Physical                            was performed, and patient medications and                            allergies were reviewed. The patient's tolerance of                            previous anesthesia was also reviewed. The risks                            and benefits of the procedure and the sedation                            options and risks were discussed with the patient.                            All questions were answered, and informed consent                            was obtained. Prior Anticoagulants: The patient has                            taken no previous anticoagulant or antiplatelet                            agents. ASA Grade Assessment: III - A patient with                            severe systemic disease. After reviewing the risks                            and benefits, the patient was deemed in                            satisfactory condition to undergo the procedure.                           After obtaining informed consent, the endoscope was  passed under direct vision. Throughout the                            procedure, the patient's blood pressure, pulse, and                            oxygen saturations were monitored continuously. The                            Model GIF-[id number]) scope was introduced                            through the mouth, and advanced to the second part                            of duodenum. The upper GI  endoscopy was                            accomplished without difficulty. The patient                            tolerated the procedure well. Scope In: Scope Out: Findings:                 The examined esophagus was normal.                           The entire examined stomach was normal. Biopsies                            were taken with a cold forceps for histology and                            Helicobacter pylori testing.                           The cardia and gastric fundus were normal on                            retroflexion.                           The examined duodenum was normal. Complications:            No immediate complications. Estimated Blood Loss:     Estimated blood loss was minimal. Impression:               - Normal esophagus.                           - Normal stomach. Biopsied.                           - Normal examined duodenum. Recommendation:           - Patient has a contact number available for  emergencies. The signs and symptoms of potential                            delayed complications were discussed with the                            patient. Return to normal activities tomorrow.                            Written discharge instructions were provided to the                            patient.                           - Resume previous diet. When you are having                            epigastric pain and distention (swelling) focus on                            the Stage/Step 3 gastroparesis diet.                           - Continue present medications.                           - Await pathology results.                           - Proceed with MRI abdomen with IV contrast (renal                            protocol) to further characterize the right renal                            cyst seen by [place] recently. Jerene Bears, MD 03/27/2018 4:28:32 PM This report has been signed electronically.

## 2018-03-30 ENCOUNTER — Encounter: Payer: Self-pay | Admitting: Internal Medicine

## 2018-03-30 ENCOUNTER — Telehealth: Payer: Self-pay

## 2018-03-30 ENCOUNTER — Ambulatory Visit (INDEPENDENT_AMBULATORY_CARE_PROVIDER_SITE_OTHER): Payer: Medicare HMO | Admitting: Internal Medicine

## 2018-03-30 ENCOUNTER — Encounter

## 2018-03-30 ENCOUNTER — Other Ambulatory Visit (INDEPENDENT_AMBULATORY_CARE_PROVIDER_SITE_OTHER): Payer: Medicare HMO

## 2018-03-30 VITALS — BP 180/92 | HR 104 | Temp 98.0°F | Resp 16 | Ht 66.0 in | Wt 200.0 lb

## 2018-03-30 DIAGNOSIS — E876 Hypokalemia: Secondary | ICD-10-CM

## 2018-03-30 DIAGNOSIS — I10 Essential (primary) hypertension: Secondary | ICD-10-CM

## 2018-03-30 DIAGNOSIS — G932 Benign intracranial hypertension: Secondary | ICD-10-CM | POA: Diagnosis not present

## 2018-03-30 DIAGNOSIS — R801 Persistent proteinuria, unspecified: Secondary | ICD-10-CM

## 2018-03-30 DIAGNOSIS — E559 Vitamin D deficiency, unspecified: Secondary | ICD-10-CM | POA: Diagnosis not present

## 2018-03-30 DIAGNOSIS — E785 Hyperlipidemia, unspecified: Secondary | ICD-10-CM

## 2018-03-30 DIAGNOSIS — E781 Pure hyperglyceridemia: Secondary | ICD-10-CM | POA: Diagnosis not present

## 2018-03-30 DIAGNOSIS — F419 Anxiety disorder, unspecified: Secondary | ICD-10-CM

## 2018-03-30 DIAGNOSIS — R739 Hyperglycemia, unspecified: Secondary | ICD-10-CM

## 2018-03-30 DIAGNOSIS — F5105 Insomnia due to other mental disorder: Secondary | ICD-10-CM

## 2018-03-30 LAB — COMPREHENSIVE METABOLIC PANEL
ALK PHOS: 69 U/L (ref 39–117)
ALT: 26 U/L (ref 0–35)
AST: 21 U/L (ref 0–37)
Albumin: 4.1 g/dL (ref 3.5–5.2)
BILIRUBIN TOTAL: 0.3 mg/dL (ref 0.2–1.2)
BUN: 14 mg/dL (ref 6–23)
CO2: 28 mEq/L (ref 19–32)
Calcium: 9.4 mg/dL (ref 8.4–10.5)
Chloride: 103 mEq/L (ref 96–112)
Creatinine, Ser: 0.97 mg/dL (ref 0.40–1.20)
GFR: 80.04 mL/min (ref >60.00)
Glucose, Bld: 88 mg/dL (ref 70–99)
POTASSIUM: 3.3 meq/L — AB (ref 3.5–5.1)
Sodium: 139 mEq/L (ref 135–145)
Total Protein: 7.5 g/dL (ref 6.0–8.3)

## 2018-03-30 LAB — URINALYSIS, ROUTINE W REFLEX MICROSCOPIC
Bilirubin Urine: NEGATIVE
Leukocytes, UA: NEGATIVE
Nitrite: NEGATIVE
Specific Gravity, Urine: 1.02 (ref 1.000–1.030)
TOTAL PROTEIN, URINE-UPE24: 100 — AB
Urine Glucose: NEGATIVE
Urobilinogen, UA: 0.2 (ref 0.0–1.0)
pH: 6 (ref 5.0–8.0)

## 2018-03-30 LAB — LIPID PANEL
Cholesterol: 209 mg/dL — ABNORMAL HIGH (ref 0–200)
HDL: 38.9 mg/dL — ABNORMAL LOW (ref >39.00)
NonHDL: 170.57
Total CHOL/HDL Ratio: 5
Triglycerides: 204 mg/dL — ABNORMAL HIGH (ref 0.0–149.0)
VLDL: 40.8 mg/dL — ABNORMAL HIGH (ref 0.0–40.0)

## 2018-03-30 LAB — TSH: TSH: 1.53 u[IU]/mL (ref 0.35–4.50)

## 2018-03-30 LAB — VITAMIN D 25 HYDROXY (VIT D DEFICIENCY, FRACTURES): VITD: 11.3 ng/mL — ABNORMAL LOW (ref 30.00–100.00)

## 2018-03-30 LAB — HEMOGLOBIN A1C: Hgb A1c MFr Bld: 5.8 % (ref 4.6–6.5)

## 2018-03-30 LAB — MAGNESIUM: MAGNESIUM: 2 mg/dL (ref 1.5–2.5)

## 2018-03-30 LAB — LDL CHOLESTEROL, DIRECT: LDL DIRECT: 152 mg/dL

## 2018-03-30 MED ORDER — CLONIDINE HCL 0.3 MG/24HR TD PTWK: 0.3000 mg | MEDICATED_PATCH | TRANSDERMAL | 1 refills | Status: DC

## 2018-03-30 MED ORDER — SUVOREXANT 15 MG PO TABS: 1.0000 | ORAL_TABLET | Freq: Every day | ORAL | 5 refills | Status: DC

## 2018-03-30 NOTE — Telephone Encounter (Signed)
  Follow up Call-  Call back number 03/27/2018  Post procedure Call Back phone  # [phone]  Permission to leave phone message Yes  Some recent data might be hidden     Patient questions:  Do you have a fever, pain , or abdominal swelling? No. Pain Score  0 *  Have you tolerated food without any problems? Yes.    Have you been able to return to your normal activities? Yes.    Do you have any questions about your discharge instructions: Diet   No. Medications  No. Follow up visit  No.  Do you have questions or concerns about your Care? No.  Actions: * If pain score is 4 or above: No action needed, pain <4.

## 2018-03-30 NOTE — Progress Notes (Signed)
Subjective:  Patient ID: Erica [name], female    DOB: 10-19-1975  Age: 42 y.o. MRN: [id number]  CC: Hypertension and Hyperlipidemia   HPI ALESSA MAZUR presents for f/up - She admits to noncompliance with her antihypertensive meds.  She tells me she just does not like to take pills.  She continues to complain of mild intermittent headaches.  She also complains of insomnia.  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  She took her last dose of furosemide over a week ago.  She occasionally takes clonidine but wants to know if it can be changed to a patch.  Outpatient Medications Prior to Visit  Medication Sig Dispense Refill  . acetaZOLAMIDE (DIAMOX) 250 MG tablet TAKE 4 TABLETS BY MOUTH 3 (THREE) TIMES DAILY. 1080 tablet 1  . albuterol (PROVENTIL) (2.5 MG/3ML) 0.083% nebulizer solution USE 1 VIAL VIA NEBULIZER EVERY 4 HOURS AND AS NEEDED FOR WHEEZING OR SHORTNESS OF BREATH 75 mL 1  . amitriptyline (ELAVIL) 100 MG tablet Take 100 mg by mouth at bedtime.    Marland Kitchen amLODipine (NORVASC) 10 MG tablet Take 1 tablet (10 mg total) by mouth daily. 90 tablet 0  . budesonide-formoterol (SYMBICORT) 160-4.5 MCG/ACT inhaler INHALE 2 PUFFS INTO LUNGS 2 TIMES DAILY AS DIRECTED    . carvedilol (COREG) 6.25 MG tablet TAKE 1 TABLET BY MOUTH TWICE A DAY 180 tablet 0  . cloNIDine (CATAPRES) 0.3 MG tablet TAKE 1 TABLET BY MOUTH TWICE A DAY 180 tablet 3  . clotrimazole (MYCELEX) 10 MG troche Take 1 tablet (10 mg total) by mouth 5 (five) times daily. 30 tablet 11  . dexlansoprazole (DEXILANT) 60 MG capsule Take 1 capsule (60 mg total) by mouth daily. 90 capsule 3  . dextromethorphan-guaiFENesin (MUCINEX DM) 30-600 MG 12hr tablet Take 1 tablet by mouth 2 (two) times daily.     Marland Kitchen dicyclomine (BENTYL) 20 MG tablet TAKE 1 TABLET BY MOUTH 3 TIMES DAILY AS NEEDED FOR SPASMS 90 tablet 1  . diltiazem (CARDIZEM LA) 420 MG 24 hr tablet Take 1 tablet (420 mg  total) by mouth daily. 90 tablet 0  . DULoxetine (CYMBALTA) 30 MG capsule Take 1 capsule (30 mg total) by mouth daily. With 60mg  for total of 90 mg 30 capsule 1  . DULoxetine (CYMBALTA) 60 MG capsule Take 1 capsule (60 mg total) by mouth daily. For total of 90 mg 30 capsule 1  . EPINEPHRINE 0.3 mg/0.3 mL IJ SOAJ injection INJECT 0.3 MLS INTO THE MUSCLE ONCE AS NEEDED FOR ANAPHYLAXIS 2 Device 2  . furosemide (LASIX) 40 MG tablet TAKE 1 TABLET BY MOUTH TWICE A DAY 180 tablet 0  . hydrALAZINE (APRESOLINE) 100 MG tablet TAKE 1 TABLET (100 MG TOTAL) BY MOUTH 3 (THREE) TIMES DAILY. 270 tablet 2  . lidocaine-prilocaine (EMLA) cream Apply 1 application topically. Apply 1 application to skin ever 28 days    . metoCLOPramide (REGLAN) 10 MG tablet TAKE 1 TABLET BY MOUTH 4 TIMES A DAY WITH MEALS AND AT BEDTIME 360 tablet 3  . metoprolol tartrate (LOPRESSOR) 100 MG tablet Take 1 tablet (100 mg total) by mouth as directed. Take 1 tablet 1 hour before your test 1 tablet 0  . montelukast (SINGULAIR) 10 MG tablet TAKE 1 TABLET BY MOUTH ONCE DAILY AT BEDTIME 30 tablet 5  . NITROSTAT 0.4 MG SL tablet PLACE 1 TABLET UNDER TONGUE EVERY 5 MINUTES AS NEEDED  FOR CHEST PAIN, MAX OF 3 TABLETS THEN CALL 911 IF PAIN PERSISTS 100 tablet 0  . OXYGEN Use CPAP at bedtime    . oxymetazoline (MUCINEX NASAL SPRAY FULL FORCE) 0.05 % nasal spray Place 1 spray into both nostrils 2 (two) times daily.    . potassium chloride SA (K-DUR,KLOR-CON) 20 MEQ tablet Take 20 mEq by mouth 2 (two) times daily.    . primidone (MYSOLINE) 50 MG tablet TAKE 0.5 TABLETS (25 MG TOTAL) BY MOUTH AT BEDTIME. 45 tablet 1  . promethazine (PHENERGAN) 25 MG suppository Place 1 suppository (25 mg total) rectally every 6 (six) hours as needed for nausea or vomiting. 15 each 1  . promethazine (PHENERGAN) 25 MG tablet Take 1 tablet (25 mg total) by mouth 3 (three) times daily as needed for nausea or vomiting. 270 tablet 1  . ranolazine (RANEXA) 500 MG 12 hr tablet  TAKE 1 TABLET BY MOUTH TWICE A DAY 180 tablet 0  . Respiratory Therapy Supplies (FLUTTER) DEVI Use as directed 1 each 0  . Spacer/Aero-Holding Chambers (AEROCHAMBER MV) inhaler Use as instructed 1 each 0  . spironolactone (ALDACTONE) 100 MG tablet TAKE 1 TABLET BY MOUTH EVERY DAY 90 tablet 0  . SYMBICORT 160-4.5 MCG/ACT inhaler INHALE 2 PUFFS INTO LUNGS 2 TIMES DAILY AS DIRECTED 30.6 Inhaler 1  . traZODone (DESYREL) 150 MG tablet TAKE 1 TABLET BY MOUTH AT BEDTIME 90 tablet 1  . valsartan (DIOVAN) 160 MG tablet Take 1 tablet (160 mg total) by mouth daily. 30 tablet 11  . VENTOLIN HFA 108 (90 Base) MCG/ACT inhaler INHALE 2 PUFFS BY MOUTH EVERY 4 HOURS AS NEEDED FOR WHEEZE OR FOR SHORTNESS OF BREATH 18 Inhaler 1  . acetaminophen-codeine (TYLENOL #4) 300-60 MG tablet Take 1 tablet by mouth every 4 (four) hours as needed for pain. 40 tablet 0  . Biotin 10000 MCG TABS Take 1 tablet by mouth daily.    . chlorpheniramine (CHLOR-TRIMETON) 4 MG tablet 2 tabs by mouth at bedtime.  May take 1 extra every 4 hours as needed    . chlorthalidone (HYGROTON) 25 MG tablet TAKE 1 TABLET (25 MG TOTAL) DAILY BY MOUTH. 90 tablet 0  . LORazepam (ATIVAN) 0.5 MG tablet Take 0.5 mg by mouth every 8 (eight) hours.    . ranitidine (ZANTAC) 150 MG tablet Take 1 tablet (150 mg total) by mouth 2 (two) times daily. 180 tablet 3  . tapentadol HCl (NUCYNTA) 75 MG tablet Take 1 tablet (75 mg total) every 6 (six) hours as needed by mouth. 120 tablet 0  . topiramate (TOPAMAX) 25 MG tablet Take 25 mg by mouth 2 (two) times daily.    . potassium chloride 20 MEQ TBCR Take 40 mEq by mouth daily for 1 day. 2 tablet 0   No facility-administered medications prior to visit.     ROS Review of Systems  Constitutional: Negative for diaphoresis, fatigue and unexpected weight change.  HENT: Negative.   Eyes: Negative for visual disturbance.  Respiratory: Negative for cough, chest tightness, shortness of breath and wheezing.     Cardiovascular: Negative for chest pain, palpitations and leg swelling.  Gastrointestinal: Negative for abdominal pain, constipation, diarrhea, nausea and vomiting.  Endocrine: Negative.   Genitourinary: Negative.  Negative for difficulty urinating, dysuria, frequency and hematuria.  Musculoskeletal: Negative.  Negative for arthralgias and myalgias.  Skin: Negative.  Negative for pallor.  Neurological: Positive for headaches. Negative for dizziness, weakness, light-headedness and numbness.  Hematological: Negative for adenopathy. Does not bruise/bleed  easily.  Psychiatric/Behavioral: Positive for sleep disturbance. Negative for agitation, dysphoric mood and suicidal ideas. The patient is not nervous/anxious.     Objective:  BP (!) 180/92 (BP Location: Right Arm, Patient Position: Sitting, Cuff Size: Large)   Pulse (!) 104   Temp 98 F (36.7 C) (Oral)   Resp 16   Ht 5\' 6"  (1.676 m)   Wt 200 lb (90.7 kg)   SpO2 98%   BMI 32.28 kg/m   BP Readings from Last 3 Encounters:  03/30/18 (!) 180/92  03/27/18 (!) 177/106  03/13/18 (!) 160/100    Wt Readings from Last 3 Encounters:  03/30/18 200 lb (90.7 kg)  03/27/18 205 lb (93 kg)  03/13/18 205 lb (93 kg)    Physical Exam Vitals signs reviewed.  Constitutional:      General: She is not in acute distress.    Appearance: She is obese. She is not ill-appearing, toxic-appearing or diaphoretic.  HENT:     Nose: Nose normal.     Mouth/Throat:     Mouth: Mucous membranes are moist.     Pharynx: No posterior oropharyngeal erythema.  Eyes:     Extraocular Movements: Extraocular movements intact.     Conjunctiva/sclera: Conjunctivae normal.     Pupils: Pupils are equal, round, and reactive to light.  Neck:     Musculoskeletal: Normal range of motion and neck supple. No neck rigidity or muscular tenderness.  Cardiovascular:     Rate and Rhythm: Normal rate and regular rhythm.     Pulses: Normal pulses.     Heart sounds: Normal heart  sounds. No murmur. No gallop.   Pulmonary:     Effort: Pulmonary effort is normal.     Breath sounds: Normal breath sounds. No stridor. No wheezing, rhonchi or rales.  Abdominal:     General: Abdomen is flat. Bowel sounds are normal.     Palpations: There is no hepatomegaly or mass.     Tenderness: There is no abdominal tenderness. There is no guarding.  Musculoskeletal: Normal range of motion.        General: No swelling.     Right lower leg: No edema.     Left lower leg: No edema.  Lymphadenopathy:     Cervical: No cervical adenopathy.  Skin:    General: Skin is warm and dry.  Neurological:     General: No focal deficit present.     Mental Status: She is alert and oriented to person, place, and time. Mental status is at baseline.  Psychiatric:        Mood and Affect: Mood normal.        Behavior: Behavior normal.        Thought Content: Thought content normal.        Judgment: Judgment normal.     Lab Results  Component Value Date   WBC 6.6 03/13/2018   HGB 14.0 03/13/2018   HCT 41.6 03/13/2018   PLT 215.0 03/13/2018   GLUCOSE 88 03/30/2018   CHOL 209 (H) 03/30/2018   TRIG 204.0 (H) 03/30/2018   HDL 38.90 (L) 03/30/2018   LDLDIRECT 152.0 03/30/2018   LDLCALC 114 (H) 10/15/2016   ALT 26 03/30/2018   AST 21 03/30/2018   NA 139 03/30/2018   K 3.3 (L) 03/30/2018   CL 103 03/30/2018   CREATININE 0.97 03/30/2018   BUN 14 03/30/2018   CO2 28 03/30/2018   TSH 1.53 03/30/2018   INR 1.1 (H) 06/03/2012   HGBA1C 5.8  03/30/2018    US Abdomen Complete  Result Date: 03/18/2018 CLINICAL DATA:  Abdominal distention and generalized abdominal pain EXAM: ABDOMEN ULTRASOUND COMPLETE COMPARISON:  None similar FINDINGS: Gallbladder: Nonshadowing echogenic focus on the posterior wall measuring 3 mm, most compatible with polyp as no motion is noted by the sonographer. There is no wall thickening or focal tenderness. Common bile duct: Diameter: 3 mm Liver: Echogenic liver with  geographic central sparing. Portal vein is patent on color Doppler imaging with normal direction of blood flow towards the liver. IVC: No abnormality visualized. Pancreas: Visualized portion unremarkable. Spleen: Size and appearance within normal limits. Right Kidney: Length: 11.6 cm. Echogenicity within normal limits. No hydronephrosis. Multi and thickly septated cyst in the interpolar right kidney measuring up to 2.7 cm. Left Kidney: Length: 12 cm. Echogenicity within normal limits. No mass or hydronephrosis visualized. Abdominal aorta: No aneurysm visualized. IMPRESSION: 1. No acute finding. 2. Complex cyst in the interpolar right kidney measuring 2.7 cm. Recommend cross-sectional imaging follow-up, preferably MRI with contrast. 3. 3 mm gallbladder polyp. Electronically Signed   By: Monte Fantasia M.D.   On: 03/18/2018 16:32    Assessment & Plan:   Sherrey was seen today for hypertension and hyperlipidemia.  Diagnoses and all orders for this visit:  Essential hypertension- Her blood pressure is not adequately well controlled.  I will change the clonidine tablets to a patch.  I have encouraged her to be compliant with her other antihypertensives.  I will treat the vitamin D deficiency. -     VITAMIN D 25 Hydroxy (Vit-D Deficiency, Fractures); Future -     TSH; Future -     Urinalysis, Routine w reflex microscopic; Future -     Comprehensive metabolic panel; Future -     cloNIDine (CATAPRES - DOSED IN MG/24 HR) 0.3 mg/24hr patch; Place 1 patch (0.3 mg total) onto the skin once a week.  Idiopathic intracranial hypertension- I have encouraged her to follow-up soon with her neurologist about this. -     cloNIDine (CATAPRES - DOSED IN MG/24 HR) 0.3 mg/24hr patch; Place 1 patch (0.3 mg total) onto the skin once a week. -     Ambulatory referral to Neurology  Hypokalemia- Her potassium remains low at 3.3.  I have asked her to restart a potassium supplement. -     Magnesium;  Future  Hypertriglyceridemia without hypercholesterolemia-triglycerides are mildly elevated.  I have asked her to decrease her intake of fat and carbohydrates. -     Lipid panel; Future -     Comprehensive metabolic panel; Future  Hyperglycemia- her A1c is at 5.8%.  Medical therapy is not indicated. -     Hemoglobin A1c; Future -     Comprehensive metabolic panel; Future  Hyperlipidemia LDL goal <100- She has an elevated ASCVD risk score so I have asked her to start taking a statin for CV risk reduction. -     Comprehensive metabolic panel; Future -     rosuvastatin (CRESTOR) 20 MG tablet; Take 1 tablet (20 mg total) by mouth daily.  Insomnia secondary to anxiety -     Suvorexant (BELSOMRA) 15 MG TABS; Take 1 tablet by mouth daily.  Vitamin D deficiency disease -     Cholecalciferol 50 MCG (2000 UT) TABS; Take 2 tablets (4,000 Units total) by mouth daily.  Persistent proteinuria -     Ambulatory referral to Nephrology   I have discontinued Chonita D. Preiss's Biotin, chlorpheniramine, tapentadol HCl, topiramate, LORazepam, ranitidine, chlorthalidone, acetaminophen-codeine,  and Potassium Chloride ER. I am also having her start on Suvorexant, cloNIDine, Cholecalciferol, and rosuvastatin. Additionally, I am having her maintain her FLUTTER, OXYGEN, NITROSTAT, dextromethorphan-guaiFENesin, amitriptyline, dicyclomine, EPINEPHrine, albuterol, traZODone, budesonide-formoterol, potassium chloride SA, cloNIDine, lidocaine-prilocaine, amLODipine, dexlansoprazole, promethazine, metoCLOPramide, oxymetazoline, hydrALAZINE, diltiazem, montelukast, DULoxetine, VENTOLIN HFA, DULoxetine, primidone, spironolactone, carvedilol, furosemide, ranolazine, SYMBICORT, valsartan, clotrimazole, AEROCHAMBER MV, acetaZOLAMIDE, metoprolol tartrate, and promethazine.  Meds ordered this encounter  Medications  . Suvorexant (BELSOMRA) 15 MG TABS    Sig: Take 1 tablet by mouth daily.    Dispense:  30 tablet    Refill:  5   . cloNIDine (CATAPRES - DOSED IN MG/24 HR) 0.3 mg/24hr patch    Sig: Place 1 patch (0.3 mg total) onto the skin once a week.    Dispense:  12 patch    Refill:  1  . Cholecalciferol 50 MCG (2000 UT) TABS    Sig: Take 2 tablets (4,000 Units total) by mouth daily.    Dispense:  180 tablet    Refill:  1  . rosuvastatin (CRESTOR) 20 MG tablet    Sig: Take 1 tablet (20 mg total) by mouth daily.    Dispense:  90 tablet    Refill:  1     Follow-up: Return in about 3 months (around 06/29/2018).  Scarlette Calico, MD

## 2018-03-30 NOTE — Patient Instructions (Signed)

## 2018-03-31 ENCOUNTER — Encounter: Payer: Self-pay | Admitting: Internal Medicine

## 2018-03-31 DIAGNOSIS — R801 Persistent proteinuria, unspecified: Secondary | ICD-10-CM

## 2018-03-31 DIAGNOSIS — E559 Vitamin D deficiency, unspecified: Secondary | ICD-10-CM | POA: Insufficient documentation

## 2018-03-31 DIAGNOSIS — E876 Hypokalemia: Secondary | ICD-10-CM

## 2018-03-31 HISTORY — DX: Persistent proteinuria, unspecified: R80.1

## 2018-03-31 HISTORY — DX: Vitamin D deficiency, unspecified: E55.9

## 2018-03-31 MED ORDER — POTASSIUM CHLORIDE CRYS ER 20 MEQ PO TBCR: 20.0000 meq | EXTENDED_RELEASE_TABLET | Freq: Two times a day (BID) | ORAL | 0 refills | Status: DC

## 2018-03-31 MED ORDER — ROSUVASTATIN CALCIUM 20 MG PO TABS: 20.0000 mg | ORAL_TABLET | Freq: Every day | ORAL | 1 refills | Status: DC

## 2018-03-31 MED ORDER — CHOLECALCIFEROL 50 MCG (2000 UT) PO TABS: 2.0000 | ORAL_TABLET | Freq: Every day | ORAL | 1 refills | Status: DC

## 2018-04-03 ENCOUNTER — Telehealth (HOSPITAL_COMMUNITY): Payer: Self-pay | Admitting: Emergency Medicine

## 2018-04-03 MED ORDER — AMITRIPTYLINE HCL 100 MG PO TABS: 100.0000 mg | ORAL_TABLET | Freq: Every day | ORAL | 0 refills | Status: DC

## 2018-04-03 MED ORDER — POTASSIUM CHLORIDE CRYS ER 20 MEQ PO TBCR: 20.0000 meq | EXTENDED_RELEASE_TABLET | Freq: Two times a day (BID) | ORAL | 0 refills | Status: DC

## 2018-04-03 NOTE — Telephone Encounter (Signed)
Attempting to reach out to patient regarding upcoming cardiac imaging appointment. Phone rang for 5 mins. No answering service. Will try again. Marchia Bond RN Navigator Cardiac Imaging [phone]

## 2018-04-06 ENCOUNTER — Other Ambulatory Visit: Payer: Medicare HMO | Admitting: *Deleted

## 2018-04-06 DIAGNOSIS — R7989 Other specified abnormal findings of blood chemistry: Secondary | ICD-10-CM | POA: Diagnosis not present

## 2018-04-07 ENCOUNTER — Encounter: Payer: Self-pay | Admitting: Internal Medicine

## 2018-04-07 ENCOUNTER — Ambulatory Visit (HOSPITAL_COMMUNITY)
Admission: RE | Admit: 2018-04-07 | Discharge: 2018-04-07 | Disposition: A | Discharge status: Home / Self Care | Payer: Medicare HMO | Source: Ambulatory Visit | Attending: Cardiology | Admitting: Cardiology

## 2018-04-07 ENCOUNTER — Encounter (HOSPITAL_COMMUNITY): Payer: Self-pay

## 2018-04-07 ENCOUNTER — Ambulatory Visit (HOSPITAL_COMMUNITY): Payer: Medicare HMO

## 2018-04-07 DIAGNOSIS — R079 Chest pain, unspecified: Secondary | ICD-10-CM | POA: Diagnosis not present

## 2018-04-07 LAB — BASIC METABOLIC PANEL
BUN / CREAT RATIO: 12 (ref 9–23)
BUN: 12 mg/dL (ref 6–24)
CO2: 23 mmol/L (ref 20–29)
Calcium: 9.4 mg/dL (ref 8.7–10.2)
Chloride: 103 mmol/L (ref 96–106)
Creatinine, Ser: 0.98 mg/dL (ref 0.57–1.00)
GFR, EST AFRICAN AMERICAN: 81 mL/min/{1.73_m2} (ref >59)
GFR, EST NON AFRICAN AMERICAN: 70 mL/min/{1.73_m2} (ref >59)
Glucose: 99 mg/dL (ref 65–99)
Potassium: 3.8 mmol/L (ref 3.5–5.2)
Sodium: 144 mmol/L (ref 134–144)

## 2018-04-07 MED ORDER — METOPROLOL TARTRATE 5 MG/5ML IV SOLN
INTRAVENOUS | Status: AC
  Filled 2018-04-07: qty 20

## 2018-04-07 MED ORDER — DIPHENHYDRAMINE HCL 25 MG PO CAPS
25.0000 mg | ORAL_CAPSULE | Freq: Once | ORAL | Status: AC
  Administered 2018-04-07: 25 mg via ORAL
  Filled 2018-04-07: qty 1

## 2018-04-07 MED ORDER — DIPHENHYDRAMINE HCL 25 MG PO CAPS
ORAL_CAPSULE | ORAL | Status: AC
  Filled 2018-04-07: qty 1

## 2018-04-07 MED ORDER — SODIUM CHLORIDE 0.9 % IV BOLUS: 1000.0000 mL | Freq: Once | INTRAVENOUS | Status: DC

## 2018-04-07 MED ORDER — NITROGLYCERIN 0.4 MG SL SUBL
SUBLINGUAL_TABLET | SUBLINGUAL | Status: AC
  Filled 2018-04-07: qty 1

## 2018-04-07 MED ORDER — NITROGLYCERIN 0.4 MG SL SUBL
0.8000 mg | SUBLINGUAL_TABLET | SUBLINGUAL | Status: DC | PRN
  Filled 2018-04-07: qty 25

## 2018-04-07 MED ORDER — METOPROLOL TARTRATE 5 MG/5ML IV SOLN
5.0000 mg | INTRAVENOUS | Status: DC | PRN
  Administered 2018-04-07 (×3): 5 mg via INTRAVENOUS
  Filled 2018-04-07 (×3): qty 5

## 2018-04-08 ENCOUNTER — Telehealth: Payer: Self-pay | Admitting: Cardiology

## 2018-04-08 ENCOUNTER — Inpatient Hospital Stay: Payer: Medicare HMO | Attending: Oncology

## 2018-04-08 DIAGNOSIS — C50412 Malignant neoplasm of upper-outer quadrant of left female breast: Secondary | ICD-10-CM | POA: Diagnosis not present

## 2018-04-08 DIAGNOSIS — Z5111 Encounter for antineoplastic chemotherapy: Secondary | ICD-10-CM | POA: Insufficient documentation

## 2018-04-08 MED ORDER — GOSERELIN ACETATE 3.6 MG ~~LOC~~ IMPL
DRUG_IMPLANT | SUBCUTANEOUS | Status: AC
  Filled 2018-04-08: qty 3.6

## 2018-04-08 MED ORDER — GOSERELIN ACETATE 3.6 MG ~~LOC~~ IMPL
3.6000 mg | DRUG_IMPLANT | SUBCUTANEOUS | Status: DC
  Administered 2018-04-08: 3.6 mg via SUBCUTANEOUS

## 2018-04-08 NOTE — Telephone Encounter (Signed)
New Message    Patient returning your call about lab results

## 2018-04-13 ENCOUNTER — Ambulatory Visit (INDEPENDENT_AMBULATORY_CARE_PROVIDER_SITE_OTHER): Payer: Medicare HMO | Admitting: Cardiology

## 2018-04-13 ENCOUNTER — Encounter: Payer: Self-pay | Admitting: Cardiology

## 2018-04-13 ENCOUNTER — Encounter: Payer: Self-pay | Admitting: *Deleted

## 2018-04-13 VITALS — BP 178/96 | HR 104 | Ht 66.0 in | Wt 202.2 lb

## 2018-04-13 DIAGNOSIS — G4733 Obstructive sleep apnea (adult) (pediatric): Secondary | ICD-10-CM | POA: Diagnosis not present

## 2018-04-13 DIAGNOSIS — I428 Other cardiomyopathies: Secondary | ICD-10-CM | POA: Diagnosis not present

## 2018-04-13 DIAGNOSIS — I110 Hypertensive heart disease with heart failure: Secondary | ICD-10-CM | POA: Diagnosis not present

## 2018-04-13 DIAGNOSIS — R079 Chest pain, unspecified: Secondary | ICD-10-CM

## 2018-04-13 DIAGNOSIS — I10 Essential (primary) hypertension: Secondary | ICD-10-CM | POA: Diagnosis not present

## 2018-04-13 DIAGNOSIS — R000 Tachycardia, unspecified: Secondary | ICD-10-CM

## 2018-04-13 DIAGNOSIS — R0602 Shortness of breath: Secondary | ICD-10-CM

## 2018-04-13 MED ORDER — VALSARTAN 320 MG PO TABS: 320.0000 mg | ORAL_TABLET | Freq: Every day | ORAL | 1 refills | Status: DC

## 2018-04-13 MED ORDER — NEBIVOLOL HCL 10 MG PO TABS: 10.0000 mg | ORAL_TABLET | Freq: Every day | ORAL | 1 refills | Status: DC

## 2018-04-13 NOTE — Progress Notes (Addendum)
Cardiology Office Note:    Date:  04/13/2018   ID:  Erica [name], DOB 05-Dec-1975, MRN [id number]  PCP:  [name], MD  Cardiologist:  Ena Dawley, MD  Referring MD: [name], MD   Chief complaint: Chest pain  History of Present Illness:    Erica [name] is a 43 y.o. female with a past medical history significant for breast cancer 2010 treated with chemo and radiation-now on immunotherapy, asthma, chronic cough, hypertension, GERD, OSA on CPAP and obesity.  She also has a history of recurrent syncope with cardiac monitor showing only sinus rhythm.  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  She does not tolerate her CPAP well, she has tried 3 different mask and still feels like she is suffocating and does not use it on a regular basis. 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 She has been seen by our hypertension clinic in the past for management of blood pressures poorly controlled despite multiple medications. We tried different medications she is currently on combination of medications, with still severely elevated both systolic diastolic pressures, she previously did not tolerate Imdur as she developed severe headaches. She underwent renal arterial Doppler all that showed no stenosis. She also suffers from asthma and needs to use inhaler on a daily basis. She was previously on ACE inhibitor that was discontinued because of concern of chronic cough.  This has not improved significantly with switching to ARB.  She is now complaining of exertional chest tightness that is radiating to her arm.  No dizziness or syncope.  She was scheduled for coronary CTA however her heart rate was over 100 and she developed significant rash to IV metoprolol.  She continues to feel significant shortness of breath on minimal exertion.  Past Medical History:  Diagnosis Date  .  Asthma   . Breast cancer ([place]) 2010   a. locally advanced left breast carcinoma diagnosed in 2010 and treated with neoadjuvant chemotherapy with docetaxel and cyclophosphamide as well as paclitaxel. This was followed by radiation therapy which was completed in 2011.  Marland Kitchen Dyspnea on exertion    a. 01/2013 Lexi MV: EF 54%, no ischemia/infarct;  b. 05/2013 Echo: EF 60-65%, no rwma, Gr 2 DD;  b.   . Fatty liver   . Gastroparesis   . GERD (gastroesophageal reflux disease)   . Hypertension   . Impaired glucose tolerance 04/13/2011  . Lymphadenitis, chronic    restricted LEFT extremity  . 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 OSA (obstructive sleep apnea) 05/04/2014   AHI 31/hr now on CPAP at 12cm H2O  . Personal history of chemotherapy 2010  . Personal history of radiation therapy 2010    Past Surgical History:  Procedure Laterality Date  . BRAIN SURGERY     Shunt  . BREAST LUMPECTOMY Left 09/2008  . CARPAL TUNNEL RELEASE Left 2011  . LYMPH NODE DISSECTION Left 2010   breast; 2 wks after breast lumpectomy  . TENDON RELEASE Right 2012   "hand"  . TUBAL LIGATION  05/11/2011   Procedure: POST PARTUM TUBAL LIGATION;  Surgeon: Emeterio Reeve, MD;  Location: [facility];  Service: Gynecology;  Laterality: Bilateral;  Induced for HTN    Current Medications: Current Meds  Medication Sig  . acetaZOLAMIDE (DIAMOX) 250 MG tablet TAKE 4 TABLETS BY MOUTH 3 (THREE) TIMES DAILY.  Marland Kitchen albuterol (PROVENTIL) (2.5  MG/3ML) 0.083% nebulizer solution USE 1 VIAL VIA NEBULIZER EVERY 4 HOURS AND AS NEEDED FOR WHEEZING OR SHORTNESS OF BREATH  . amitriptyline (ELAVIL) 100 MG tablet Take 1 tablet (100 mg total) by mouth at bedtime.  Marland Kitchen amLODipine (NORVASC) 10 MG tablet Take 1 tablet (10 mg total) by mouth daily.  . budesonide-formoterol (SYMBICORT) 160-4.5 MCG/ACT inhaler INHALE 2 PUFFS INTO LUNGS 2 TIMES DAILY AS DIRECTED  . Cholecalciferol 50 MCG (2000 UT) TABS Take 2 tablets (4,000 Units total) by mouth daily.  .  cloNIDine (CATAPRES - DOSED IN MG/24 HR) 0.3 mg/24hr patch Place 1 patch (0.3 mg total) onto the skin once a week.  . cloNIDine (CATAPRES) 0.3 MG tablet TAKE 1 TABLET BY MOUTH TWICE A DAY  . clotrimazole (MYCELEX) 10 MG troche Take 1 tablet (10 mg total) by mouth 5 (five) times daily.  Marland Kitchen dexlansoprazole (DEXILANT) 60 MG capsule Take 1 capsule (60 mg total) by mouth daily.  Marland Kitchen dextromethorphan-guaiFENesin (MUCINEX DM) 30-600 MG 12hr tablet Take 1 tablet by mouth 2 (two) times daily.   Marland Kitchen dicyclomine (BENTYL) 20 MG tablet TAKE 1 TABLET BY MOUTH 3 TIMES DAILY AS NEEDED FOR SPASMS  . diltiazem (CARDIZEM LA) 420 MG 24 hr tablet Take 1 tablet (420 mg total) by mouth daily.  . DULoxetine (CYMBALTA) 30 MG capsule Take 1 capsule (30 mg total) by mouth daily. With 60mg  for total of 90 mg  . DULoxetine (CYMBALTA) 60 MG capsule Take 1 capsule (60 mg total) by mouth daily. For total of 90 mg  . EPINEPHRINE 0.3 mg/0.3 mL IJ SOAJ injection INJECT 0.3 MLS INTO THE MUSCLE ONCE AS NEEDED FOR ANAPHYLAXIS  . furosemide (LASIX) 40 MG tablet TAKE 1 TABLET BY MOUTH TWICE A DAY  . hydrALAZINE (APRESOLINE) 100 MG tablet TAKE 1 TABLET (100 MG TOTAL) BY MOUTH 3 (THREE) TIMES DAILY.  Marland Kitchen lidocaine-prilocaine (EMLA) cream Apply 1 application topically. Apply 1 application to skin ever 28 days  . metoCLOPramide (REGLAN) 10 MG tablet TAKE 1 TABLET BY MOUTH 4 TIMES A DAY WITH MEALS AND AT BEDTIME  . metoprolol tartrate (LOPRESSOR) 100 MG tablet Take 1 tablet (100 mg total) by mouth as directed. Take 1 tablet 1 hour before your test  . montelukast (SINGULAIR) 10 MG tablet TAKE 1 TABLET BY MOUTH ONCE DAILY AT BEDTIME  . NITROSTAT 0.4 MG SL tablet PLACE 1 TABLET UNDER TONGUE EVERY 5 MINUTES AS NEEDED FOR CHEST PAIN, MAX OF 3 TABLETS THEN CALL 911 IF PAIN PERSISTS  . OXYGEN Use CPAP at bedtime  . oxymetazoline (MUCINEX NASAL SPRAY FULL FORCE) 0.05 % nasal spray Place 1 spray into both nostrils 2 (two) times daily.  . potassium chloride  SA (K-DUR,KLOR-CON) 20 MEQ tablet Take 1 tablet (20 mEq total) by mouth 2 (two) times daily.  . primidone (MYSOLINE) 50 MG tablet TAKE 0.5 TABLETS (25 MG TOTAL) BY MOUTH AT BEDTIME.  . promethazine (PHENERGAN) 25 MG suppository Place 1 suppository (25 mg total) rectally every 6 (six) hours as needed for nausea or vomiting.  . promethazine (PHENERGAN) 25 MG tablet Take 1 tablet (25 mg total) by mouth 3 (three) times daily as needed for nausea or vomiting.  . ranolazine (RANEXA) 500 MG 12 hr tablet TAKE 1 TABLET BY MOUTH TWICE A DAY  . Respiratory Therapy Supplies (FLUTTER) DEVI Use as directed  . rosuvastatin (CRESTOR) 20 MG tablet Take 1 tablet (20 mg total) by mouth daily.  Marland Kitchen Spacer/Aero-Holding Chambers (AEROCHAMBER MV) inhaler Use as instructed  .  spironolactone (ALDACTONE) 100 MG tablet TAKE 1 TABLET BY MOUTH EVERY DAY  . Suvorexant (BELSOMRA) 15 MG TABS Take 1 tablet by mouth daily.  . SYMBICORT 160-4.5 MCG/ACT inhaler INHALE 2 PUFFS INTO LUNGS 2 TIMES DAILY AS DIRECTED  . traZODone (DESYREL) 150 MG tablet TAKE 1 TABLET BY MOUTH AT BEDTIME  . VENTOLIN HFA 108 (90 Base) MCG/ACT inhaler INHALE 2 PUFFS BY MOUTH EVERY 4 HOURS AS NEEDED FOR WHEEZE OR FOR SHORTNESS OF BREATH  . [DISCONTINUED] carvedilol (COREG) 6.25 MG tablet TAKE 1 TABLET BY MOUTH TWICE A DAY  . [DISCONTINUED] valsartan (DIOVAN) 160 MG tablet Take 1 tablet (160 mg total) by mouth daily.     Allergies:   Aspirin; Doxycycline; Kiwi extract; Metoprolol; Penicillins; Strawberry extract; and Sulfamethoxazole-trimethoprim   Social History   Socioeconomic History  . Marital status: Single    Spouse name: Not on file  . Number of children: 3  . Years of education: Not on file  . Highest education level: Not on file  Occupational History    Employer: UNEMPLOYED  Social Needs  . Financial resource strain: Not on file  . Food insecurity:    Worry: Not on file    Inability: Not on file  . Transportation needs:    Medical: Not  on file    Non-medical: Not on file  Tobacco Use  . Smoking status: Never Smoker  . Smokeless tobacco: Never Used  Substance and Sexual Activity  . Alcohol use: Yes    Alcohol/week: 0.0 standard drinks    Comment: occasionally/socially  . Drug use: No  . Sexual activity: Yes    Birth control/protection: Surgical  Lifestyle  . Physical activity:    Days per week: Not on file    Minutes per session: Not on file  . Stress: Not on file  Relationships  . Social connections:    Talks on phone: Not on file    Gets together: Not on file    Attends religious service: Not on file    Active member of club or organization: Not on file    Attends meetings of clubs or organizations: Not on file    Relationship status: Not on file  Other Topics Concern  . Not on file  Social History Narrative   She lives with four children.   She is currently not working (disbaility pending).       Family History: The patient's family history includes Breast cancer in some other family members; Diabetes in her maternal grandmother; Fibroids (age of onset: 87) in her sister; Heart disease in her maternal grandmother; Hypertension in her maternal grandmother; Other (age of onset: 58) in her mother. There is no history of Cancer, Alcohol abuse, Early death, Hyperlipidemia, Kidney disease, Stroke, or Colon cancer.  ROS:   Please see the history of present illness.     All other systems reviewed and are negative.  EKGs/Labs/Other Studies Reviewed:    The following studies were reviewed today:  Nuclear stress test: 02/15/2013 Impression Exercise Capacity: Lexiscan with no exercise.  BP Response: Hypertensive blood pressure response.  Clinical Symptoms: Chest pain, headache.  ECG Impression: No significant ST segment change suggestive of ischemia.  Comparison with Prior Nuclear Study: No images to compare  Overall Impression: Normal stress nuclear study.  LV Ejection Fraction: 54%. LV Wall Motion: NL LV  Function; NL Wall Motion  Loralie Champagne  02/12/2013  TTE 06/17/2013 Left ventricle: The cavity size was normal. There was mild concentric hypertrophy. Systolic function  was normal. The estimated ejection fraction was in the range of 60% to 65%. Wall motion was normal; there were no regional wall motion abnormalities. Features are consistent with a pseudonormal left ventricular filling pattern, with concomitant abnormal relaxation and increased filling pressure (grade 2 diastolic dysfunction). Doppler parameters are consistent with high ventricular filling pressure. Impressions: - When compared to 2010, EF has improved.  TTE: 09/18/15 - Left ventricle: The cavity size was normal. Wall thickness was increased in a pattern of severe LVH. Systolic function was vigorous. The estimated ejection fraction was in the range of 65% to 70%. Doppler parameters are consistent with abnormal left ventricular relaxation (grade 1 diastolic dysfunction).  TTE: 03/2018 - Left ventricle: The cavity size was normal. Wall thickness was   increased in a pattern of moderate LVH. Systolic function was   mildly reduced. The estimated ejection fraction was in the range   of 45% to 50%. Wall motion was normal; there were no regional   wall motion abnormalities. Features are consistent with a   pseudonormal left ventricular filling pattern, with concomitant   abnormal relaxation and increased filling pressure (grade 2   diastolic dysfunction). - Mitral valve: There was mild regurgitation. - Pulmonary arteries: Systolic pressure was mildly increased. PA   peak pressure: 33 mm Hg (S).  EKG:  EKG is not ordered today.   Recent Labs: 03/13/2018: Hemoglobin 14.0; Platelets 215.0 03/30/2018: ALT 26; Magnesium 2.0; TSH 1.53 04/06/2018: BUN 12; Creatinine, Ser 0.98; Potassium 3.8; Sodium 144   Recent Lipid Panel    Component Value Date/Time   CHOL 209 (H) 03/30/2018 1604   TRIG 204.0 (H) 03/30/2018  1604   HDL 38.90 (L) 03/30/2018 1604   CHOLHDL 5 03/30/2018 1604   VLDL 40.8 (H) 03/30/2018 1604   LDLCALC 114 (H) 10/15/2016 1726   LDLDIRECT 152.0 03/30/2018 1604    Physical Exam:    VS:  BP (!) 178/96   Pulse (!) 104   Ht 5\' 6"  (1.676 m)   Wt 202 lb 3.2 oz (91.7 kg)   SpO2 97%   BMI 32.64 kg/m     Wt Readings from Last 3 Encounters:  04/13/18 202 lb 3.2 oz (91.7 kg)  03/30/18 200 lb (90.7 kg)  03/27/18 205 lb (93 kg)    Physical Exam  Constitutional: She is oriented to person, place, and time. She appears well-developed and well-nourished. No distress.  Cental obesity  HENT:  Head: Normocephalic and atraumatic.  Neck: Normal range of motion. Neck supple. No JVD present.  Cardiovascular: Normal rate, regular rhythm, normal heart sounds and intact distal pulses. Exam reveals no gallop and no friction rub.  No murmur heard. Pulmonary/Chest: Effort normal and breath sounds normal. No respiratory distress. She has no wheezes. She has no rales.  Abdominal: Soft. Bowel sounds are normal.  Musculoskeletal: Normal range of motion.        General: No deformity or edema.  Neurological: She is alert and oriented to person, place, and time.  Skin: Skin is warm and dry.  Psychiatric: She has a normal mood and affect. Her behavior is normal. Judgment and thought content normal.  Vitals reviewed.    ASSESSMENT:    1. Hypertensive heart disease with heart failure (HCC)   2. Chest pain, unspecified type   3. SOB (shortness of breath)   4. Obstructive sleep apnea   5. Sinus tachycardia   6. Resistant hypertension   7. Other cardiomyopathy ([place])    PLAN:    In  order of problems listed above:  Hypertensive heart disease, severe resistant hypertension -Long history of difficult to control hypertension blood pressure - No stenosis on renal artery ultrasound, normal renal parenchyma 03/2017 -She is currently on Spironolactone 100 mg daily, Lasix 40 mg twice daily, carvedilol  6.25 mg twice daily, clonidine 0.3 mg daily, Cardizem 420 mg daily, hydralazine 100 mg 3 times a day and valsartan 160 mg daily. -I will discontinue carvedilol given significant asthma with daily albuterol use, start by systolic 10 mg daily, bring patient back in 4 weeks and increase to 20 if needed, maximum dose 40 mg daily. -Increase valsartan to 320 mg daily. -Outreach to the hypertension clinic for any further advice. -She now has a new LV dysfunction most probably secondary to poorly controlled blood pressure.  Chronic combined systolic diastolic CHF, LVEF 45 to 11%, previously 60% -Secondary to poorly controlled hypertension. -She is on valsartan and Bystolic, hydralazine, did not tolerate Imdur secondary to headaches.  Chest pain -I suspect this is microvascular disease from poorly controlled hypertension, she was recently started on rosuvastatin - she was scheduled for coronary CTA but was too tachycardic, also developed allergic reaction to metoprolol, will discontinue in order D-SPECT nuclear stress test.  OSA - On CPAP, however does not use it as she does not tolerated well.  Medication Adjustments/Labs and Tests Ordered: Current medicines are reviewed at length with the patient today.  Concerns regarding medicines are outlined above. Labs and tests ordered and medication changes are outlined in the patient instructions below:  Patient Instructions  Medication Instructions:   STOP TAKING CARVEDILOL NOW  START TAKING BYSTOLIC 10 MG ONCE DAILY  INCREASE YOUR VALSARTAN TO 320 MG ONCE DAILY     Testing/Procedures:  Your physician has requested that you have a lexiscan myoview. For further information please visit [email or web address]. Please follow instruction sheet, as given.     Follow-Up:  3 WEEKS OR 3 1/2 WEEKS WITH BRITTANY SIMMONS PA-C PER DR Emmanuel Ercole       If you need a refill on your cardiac medications before your next appointment, please call your  pharmacy.      Signed, Ena Dawley, MD  04/13/2018 12:55 PM    [place]

## 2018-04-13 NOTE — Patient Instructions (Signed)
Medication Instructions:   STOP TAKING CARVEDILOL NOW  START TAKING BYSTOLIC 10 MG ONCE DAILY  INCREASE YOUR VALSARTAN TO 320 MG ONCE DAILY     Testing/Procedures:  Your physician has requested that you have a lexiscan myoview. For further information please visit [email or web address]. Please follow instruction sheet, as given.     Follow-Up:  3 WEEKS OR 3 1/2 WEEKS WITH BRITTANY SIMMONS PA-C PER DR NELSON       If you need a refill on your cardiac medications before your next appointment, please call your pharmacy.

## 2018-04-16 ENCOUNTER — Ambulatory Visit (HOSPITAL_COMMUNITY): Payer: Medicare HMO

## 2018-04-20 ENCOUNTER — Ambulatory Visit (HOSPITAL_COMMUNITY): Payer: Medicare HMO | Attending: Cardiology

## 2018-04-20 DIAGNOSIS — R079 Chest pain, unspecified: Secondary | ICD-10-CM | POA: Insufficient documentation

## 2018-04-20 DIAGNOSIS — I110 Hypertensive heart disease with heart failure: Secondary | ICD-10-CM | POA: Diagnosis not present

## 2018-04-20 LAB — MYOCARDIAL PERFUSION IMAGING
LV dias vol: 74 mL (ref 46–106)
LV sys vol: 52 mL
Peak HR: 120 {beats}/min
Rest HR: 91 {beats}/min
SDS: 2
SRS: 3
SSS: 5
TID: 0.88

## 2018-04-20 MED ORDER — TECHNETIUM TC 99M TETROFOSMIN IV KIT
10.3000 | PACK | Freq: Once | INTRAVENOUS | Status: DC | PRN
  Filled 2018-04-20: qty 11

## 2018-04-20 MED ORDER — REGADENOSON 0.4 MG/5ML IV SOLN: 0.4000 mg | Freq: Once | INTRAVENOUS | Status: DC

## 2018-04-20 MED ORDER — TECHNETIUM TC 99M TETROFOSMIN IV KIT
31.3000 | PACK | Freq: Once | INTRAVENOUS | Status: DC | PRN
  Filled 2018-04-20: qty 32

## 2018-05-04 NOTE — Progress Notes (Signed)
Cardiology Office Note:    Date:  05/06/2018   ID:  Erica [name], DOB October 13, 1975, MRN [id number]  PCP:  [name], MD  Cardiologist:  Ena Dawley, MD    Referring MD: [name], MD   Chief Complaint  Patient presents with  . Sleep Apnea  . Hypertension    History of Present Illness:    Erica [name] is a 43 y.o. female with a hx of severe OSA with an AHI of 31/hr mainly in NREM supine sleep with successful CPAP titration to 12cm H2O in 2016.  Patient never followed up after the CPAP titration.  She apparently did not tolerate the CPAP device and stopped using it.   She has had poorly controlled HTN and had difficulty finding a regimen to control her BP.   She tells me today that she still tries to use her device but that she feels the pressure is not enough.  She says that she wakes up very frequently at night and feels like the pressure is not enough.  She also has problems with dry mouth.  She wants to be able to use the device.   Past Medical History:  Diagnosis Date  . Asthma   . Breast cancer ([place]) 2010   a. locally advanced left breast carcinoma diagnosed in 2010 and treated with neoadjuvant chemotherapy with docetaxel and cyclophosphamide as well as paclitaxel. This was followed by radiation therapy which was completed in 2011.  Marland Kitchen Dyspnea on exertion    a. 01/2013 Lexi MV: EF 54%, no ischemia/infarct;  b. 05/2013 Echo: EF 60-65%, no rwma, Gr 2 DD;  b.   . Fatty liver   . Gastroparesis   . GERD (gastroesophageal reflux disease)   . Hypertension   . Impaired glucose tolerance 04/13/2011  . Lymphadenitis, chronic    restricted LEFT extremity  . 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 OSA (obstructive sleep apnea) 05/04/2014   AHI 31/hr now on CPAP at 12cm H2O  . Personal history of chemotherapy 2010  . Personal history of radiation therapy 2010    Past Surgical History:  Procedure Laterality Date  . BRAIN SURGERY     [place]  . BREAST LUMPECTOMY Left  09/2008  . CARPAL TUNNEL RELEASE Left 2011  . LYMPH NODE DISSECTION Left 2010   breast; 2 wks after breast lumpectomy  . TENDON RELEASE Right 2012   "hand"  . TUBAL LIGATION  05/11/2011   Procedure: POST PARTUM TUBAL LIGATION;  Surgeon: Emeterio Reeve, MD;  Location: [facility];  Service: Gynecology;  Laterality: Bilateral;  Induced for HTN    Current Medications: Current Meds  Medication Sig  . acetaZOLAMIDE (DIAMOX) 250 MG tablet TAKE 4 TABLETS BY MOUTH 3 (THREE) TIMES DAILY.  Marland Kitchen albuterol (PROVENTIL) (2.5 MG/3ML) 0.083% nebulizer solution USE 1 VIAL VIA NEBULIZER EVERY 4 HOURS AND AS NEEDED FOR WHEEZING OR SHORTNESS OF BREATH  . amitriptyline (ELAVIL) 100 MG tablet Take 1 tablet (100 mg total) by mouth at bedtime.  Marland Kitchen amLODipine (NORVASC) 10 MG tablet Take 1 tablet (10 mg total) by mouth daily.  . Cholecalciferol 50 MCG (2000 UT) TABS Take 2 tablets (4,000 Units total) by mouth daily.  . cloNIDine (CATAPRES - DOSED IN MG/24 HR) 0.3 mg/24hr patch Place 1 patch (0.3 mg total) onto the skin once a week.  . cloNIDine (CATAPRES) 0.3 MG tablet TAKE 1 TABLET BY MOUTH TWICE A DAY  . clotrimazole (MYCELEX) 10 MG troche Take 1 tablet (10  mg total) by mouth 5 (five) times daily.  Marland Kitchen dexlansoprazole (DEXILANT) 60 MG capsule Take 1 capsule (60 mg total) by mouth daily.  Marland Kitchen dextromethorphan-guaiFENesin (MUCINEX DM) 30-600 MG 12hr tablet Take 1 tablet by mouth 2 (two) times daily.   Marland Kitchen dicyclomine (BENTYL) 20 MG tablet TAKE 1 TABLET BY MOUTH 3 TIMES DAILY AS NEEDED FOR SPASMS  . diltiazem (CARDIZEM LA) 420 MG 24 hr tablet Take 1 tablet (420 mg total) by mouth daily.  . DULoxetine (CYMBALTA) 30 MG capsule Take 1 capsule (30 mg total) by mouth daily. With 60mg  for total of 90 mg  . DULoxetine (CYMBALTA) 60 MG capsule Take 1 capsule (60 mg total) by mouth daily. For total of 90 mg  . EPINEPHRINE 0.3 mg/0.3 mL IJ SOAJ injection INJECT 0.3 MLS INTO THE MUSCLE ONCE AS NEEDED FOR ANAPHYLAXIS  . furosemide (LASIX) 40 MG  tablet TAKE 1 TABLET BY MOUTH TWICE A DAY  . hydrALAZINE (APRESOLINE) 100 MG tablet TAKE 1 TABLET (100 MG TOTAL) BY MOUTH 3 (THREE) TIMES DAILY.  Marland Kitchen lidocaine-prilocaine (EMLA) cream Apply 1 application topically. Apply 1 application to skin ever 28 days  . metoCLOPramide (REGLAN) 10 MG tablet TAKE 1 TABLET BY MOUTH 4 TIMES A DAY WITH MEALS AND AT BEDTIME  . metoprolol tartrate (LOPRESSOR) 100 MG tablet Take 1 tablet (100 mg total) by mouth as directed. Take 1 tablet 1 hour before your test  . montelukast (SINGULAIR) 10 MG tablet TAKE 1 TABLET BY MOUTH ONCE DAILY AT BEDTIME  . nebivolol (BYSTOLIC) 10 MG tablet Take 1 tablet (10 mg total) by mouth daily.  Marland Kitchen NITROSTAT 0.4 MG SL tablet PLACE 1 TABLET UNDER TONGUE EVERY 5 MINUTES AS NEEDED FOR CHEST PAIN, MAX OF 3 TABLETS THEN CALL 911 IF PAIN PERSISTS  . OXYGEN Use CPAP at bedtime  . oxymetazoline (MUCINEX NASAL SPRAY FULL FORCE) 0.05 % nasal spray Place 1 spray into both nostrils 2 (two) times daily.  . potassium chloride SA (K-DUR,KLOR-CON) 20 MEQ tablet Take 1 tablet (20 mEq total) by mouth 2 (two) times daily.  . primidone (MYSOLINE) 50 MG tablet TAKE 0.5 TABLETS (25 MG TOTAL) BY MOUTH AT BEDTIME.  . promethazine (PHENERGAN) 25 MG suppository Place 1 suppository (25 mg total) rectally every 6 (six) hours as needed for nausea or vomiting.  . promethazine (PHENERGAN) 25 MG tablet Take 1 tablet (25 mg total) by mouth 3 (three) times daily as needed for nausea or vomiting.  . ranolazine (RANEXA) 500 MG 12 hr tablet TAKE 1 TABLET BY MOUTH TWICE A DAY  . Respiratory Therapy Supplies (FLUTTER) DEVI Use as directed  . rosuvastatin (CRESTOR) 20 MG tablet Take 1 tablet (20 mg total) by mouth daily.  Marland Kitchen Spacer/Aero-Holding Chambers (AEROCHAMBER MV) inhaler Use as instructed  . spironolactone (ALDACTONE) 100 MG tablet TAKE 1 TABLET BY MOUTH EVERY DAY  . Suvorexant (BELSOMRA) 15 MG TABS Take 1 tablet by mouth daily.  . SYMBICORT 160-4.5 MCG/ACT inhaler INHALE  2 PUFFS INTO LUNGS 2 TIMES DAILY AS DIRECTED  . traZODone (DESYREL) 150 MG tablet TAKE 1 TABLET BY MOUTH AT BEDTIME  . valsartan (DIOVAN) 320 MG tablet Take 1 tablet (320 mg total) by mouth daily.  . VENTOLIN HFA 108 (90 Base) MCG/ACT inhaler INHALE 2 PUFFS BY MOUTH EVERY 4 HOURS AS NEEDED FOR WHEEZE OR FOR SHORTNESS OF BREATH     Allergies:   Aspirin; Doxycycline; Kiwi extract; Metoprolol; Penicillins; Strawberry extract; and Sulfamethoxazole-trimethoprim   Social History   Socioeconomic History  .  Marital status: Single    Spouse name: Not on file  . Number of children: 3  . Years of education: Not on file  . Highest education level: Not on file  Occupational History    Employer: UNEMPLOYED  Social Needs  . Financial resource strain: Not on file  . Food insecurity:    Worry: Not on file    Inability: Not on file  . Transportation needs:    Medical: Not on file    Non-medical: Not on file  Tobacco Use  . Smoking status: Never Smoker  . Smokeless tobacco: Never Used  Substance and Sexual Activity  . Alcohol use: Yes    Alcohol/week: 0.0 standard drinks    Comment: occasionally/socially  . Drug use: No  . Sexual activity: Yes    Birth control/protection: Surgical  Lifestyle  . Physical activity:    Days per week: Not on file    Minutes per session: Not on file  . Stress: Not on file  Relationships  . Social connections:    Talks on phone: Not on file    Gets together: Not on file    Attends religious service: Not on file    Active member of club or organization: Not on file    Attends meetings of clubs or organizations: Not on file    Relationship status: Not on file  Other Topics Concern  . Not on file  Social History Narrative   She lives with four children.   She is currently not working (disbaility pending).        Family History: The patient's family history includes Breast cancer in some other family members; Diabetes in her maternal grandmother;  Fibroids (age of onset: 41) in her sister; Heart disease in her maternal grandmother; Hypertension in her maternal grandmother; Other (age of onset: 60) in her mother. There is no history of Cancer, Alcohol abuse, Early death, Hyperlipidemia, Kidney disease, Stroke, or Colon cancer.  ROS:   Please see the history of present illness.    ROS  All other systems reviewed and negative.   EKGs/Labs/Other Studies Reviewed:    The following studies were reviewed today: PAP download  EKG:  EKG is not ordered today.   Recent Labs: 03/13/2018: Hemoglobin 14.0; Platelets 215.0 03/30/2018: ALT 26; Magnesium 2.0; TSH 1.53 04/06/2018: BUN 12; Creatinine, Ser 0.98; Potassium 3.8; Sodium 144   Recent Lipid Panel    Component Value Date/Time   CHOL 209 (H) 03/30/2018 1604   TRIG 204.0 (H) 03/30/2018 1604   HDL 38.90 (L) 03/30/2018 1604   CHOLHDL 5 03/30/2018 1604   VLDL 40.8 (H) 03/30/2018 1604   LDLCALC 114 (H) 10/15/2016 1726   LDLDIRECT 152.0 03/30/2018 1604    Physical Exam:    VS:  BP (!) 180/102   Pulse 100   Ht 5\' 6"  (1.676 m)   Wt 200 lb (90.7 kg)   SpO2 98%   BMI 32.28 kg/m     Wt Readings from Last 3 Encounters:  05/05/18 200 lb (90.7 kg)  04/20/18 202 lb (91.6 kg)  04/13/18 202 lb 3.2 oz (91.7 kg)     GEN:  Well nourished, well developed in no acute distress HEENT: Normal NECK: No JVD; No carotid bruits LYMPHATICS: No lymphadenopathy CARDIAC: RRR, no murmurs, rubs, gallops RESPIRATORY:  Clear to auscultation without rales, wheezing or rhonchi  ABDOMEN: Soft, non-tender, non-distended MUSCULOSKELETAL:  No edema; No deformity  SKIN: Warm and dry NEUROLOGIC:  Alert and oriented x 3 PSYCHIATRIC:  Normal affect   ASSESSMENT:    1. Obstructive sleep apnea syndrome   2. Essential hypertension   3. Mild obesity    PLAN:    In order of problems listed above:  1.  OSA - She is having problems with her device and feels that she does not get enough air.  She is not  using it lot.  I am going to place her on a 2 week autotitration from 5 to 18cm H2O and get a download in 2 weeks.  I will see her back I 4 weeks to see how she is doing.     2.  HTN - BP is poorly controlled on exam today.  She will continue on Catapress 0.3mg  BID, Amlodipine  10mg  daily, Cardizem CD 420mg  daily, Hydralazine 100mg  TID, Lopressor 100mg  BID, Bystolic 10mg  daily, Valsartan 320mg  daily and spironolactone 100mg  daily. I encouraged her to be compliant with her meds and she has followup with Dr. Meda Coffee in a few weeks.  3.  Obesity 2- I have encouraged her to get into a routine exercise program and cut back on carbs and portions.   Medication Adjustments/Labs and Tests Ordered: Current medicines are reviewed at length with the patient today.  Concerns regarding medicines are outlined above.  Orders Placed This Encounter  Procedures  . CPAP   No orders of the defined types were placed in this encounter.   Signed, Fransico Him, MD  05/06/2018 5:38 PM    [place]

## 2018-05-05 ENCOUNTER — Ambulatory Visit (INDEPENDENT_AMBULATORY_CARE_PROVIDER_SITE_OTHER): Payer: Medicare HMO | Admitting: Cardiology

## 2018-05-05 ENCOUNTER — Encounter: Payer: Self-pay | Admitting: Cardiology

## 2018-05-05 ENCOUNTER — Telehealth: Payer: Self-pay | Admitting: *Deleted

## 2018-05-05 VITALS — BP 180/102 | HR 100 | Ht 66.0 in | Wt 200.0 lb

## 2018-05-05 DIAGNOSIS — G4733 Obstructive sleep apnea (adult) (pediatric): Secondary | ICD-10-CM

## 2018-05-05 DIAGNOSIS — E669 Obesity, unspecified: Secondary | ICD-10-CM | POA: Diagnosis not present

## 2018-05-05 DIAGNOSIS — I10 Essential (primary) hypertension: Secondary | ICD-10-CM | POA: Diagnosis not present

## 2018-05-05 NOTE — Patient Instructions (Addendum)
Medication Instructions:  Your physician recommends that you continue on your current medications as directed. Please refer to the Current Medication list given to you today.  If you need a refill on your cardiac medications before your next appointment, please call your pharmacy.   Lab work: None If you have labs (blood work) drawn today and your tests are completely normal, you will receive your results only by: Marland Kitchen MyChart Message (if you have MyChart) OR . A paper copy in the mail If you have any lab test that is abnormal or we need to change your treatment, we will call you to review the results.  Testing/Procedures: None  Follow-Up: We will call you to schedule your 4-6 week appointment.    Any Other Special Instructions Will Be Listed Below (If Applicable). Check blood pressure 2 times for 1 week, a hour after taking your blood pressure medication for 1 week. Send to Dr. Meda Coffee for advisement.

## 2018-05-05 NOTE — Telephone Encounter (Signed)
-----   Message from Sarina Ill, RN sent at 05/05/2018 11:51 AM EST ----- Regarding: Sleep Hello, Dr. Radford Pax changed the CPAP to auto from 5-18 cm H20 and a download in 2 weeks. He also need to get her a 4-6 week follow up, once she opens her schedule in March.  Thanks, [employer]

## 2018-05-05 NOTE — Telephone Encounter (Signed)
Order placed to [facility] today. 4-6 week f/u to schedule in march

## 2018-05-06 ENCOUNTER — Inpatient Hospital Stay: Payer: Medicare HMO | Attending: Oncology

## 2018-05-06 DIAGNOSIS — Z5111 Encounter for antineoplastic chemotherapy: Secondary | ICD-10-CM | POA: Diagnosis not present

## 2018-05-06 DIAGNOSIS — C50412 Malignant neoplasm of upper-outer quadrant of left female breast: Secondary | ICD-10-CM

## 2018-05-06 MED ORDER — GOSERELIN ACETATE 3.6 MG ~~LOC~~ IMPL
DRUG_IMPLANT | SUBCUTANEOUS | Status: AC
  Filled 2018-05-06: qty 3.6

## 2018-05-06 MED ORDER — GOSERELIN ACETATE 3.6 MG ~~LOC~~ IMPL
3.6000 mg | DRUG_IMPLANT | SUBCUTANEOUS | Status: DC
  Administered 2018-05-06: 3.6 mg via SUBCUTANEOUS

## 2018-05-14 ENCOUNTER — Encounter: Payer: Self-pay | Admitting: Internal Medicine

## 2018-05-18 ENCOUNTER — Encounter: Payer: Self-pay | Admitting: Internal Medicine

## 2018-05-18 ENCOUNTER — Encounter: Payer: Self-pay | Admitting: Cardiology

## 2018-05-18 ENCOUNTER — Ambulatory Visit (INDEPENDENT_AMBULATORY_CARE_PROVIDER_SITE_OTHER): Payer: Medicare HMO | Admitting: Cardiology

## 2018-05-18 VITALS — BP 180/80 | HR 112 | Ht 66.0 in | Wt 199.2 lb

## 2018-05-18 DIAGNOSIS — I10 Essential (primary) hypertension: Secondary | ICD-10-CM

## 2018-05-18 MED ORDER — NEBIVOLOL HCL 20 MG PO TABS: 20.0000 mg | ORAL_TABLET | Freq: Every day | ORAL | 3 refills | Status: DC

## 2018-05-18 NOTE — Patient Instructions (Signed)
Medication Instructions:  Increase BYSTOLIC to 20 mg DAILY If you need a refill on your cardiac medications before your next appointment, please call your pharmacy.   Lab work: NONE If you have labs (blood work) drawn today and your tests are completely normal, you will receive your results only by: Marland Kitchen MyChart Message (if you have MyChart) OR . A paper copy in the mail If you have any lab test that is abnormal or we need to change your treatment, we will call you to review the results.  Testing/Procedures: NONE  Follow-Up: HTN CLINIC IN 3 WEEKS  At [facility], you and your health needs are our priority.  As part of our continuing mission to provide you with exceptional heart care, we have created designated Provider Care Teams.  These Care Teams include your primary Cardiologist (physician) and Advanced Practice Providers (APPs -  Physician Assistants and Nurse Practitioners) who all work together to provide you with the care you need, when you need it. .   Any Other Special Instructions Will Be Listed Below (If Applicable).

## 2018-05-18 NOTE — Progress Notes (Signed)
05/18/2018 Erica [name]   1976/03/30  [id number]  Primary Physician [name], MD Primary Cardiologist: Ena Dawley, MD  Electrophysiologist: None  Sleep Clinic: Dr. Radford Pax  Reason for Visit/CC: f/u after stress test; HTN  HPI:  Erica [name] is a 43 y.o. female with a past medical history significant for breast cancer in  2010 treated with chemo and radiation-now on immunotherapy, asthma, chronic cough, difficult to control hypertension, GERD, OSA on CPAP and obesity.  She also has a history of recurrent syncope with cardiac monitor showing only sinus rhythm.  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 This is followed by Dr. Radford Pax. 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 She has had difficult to control BP. She is on multiple antihypertensives and has been seen in the HTN clinic. She has had renal artery dopplers that were negative for RAS. At her last OV in January, Dr. Meda Coffee stopped Coreg due to asthma and added Bystolic, 10 mg daily, with instruction to further increase to 20 mg if needed. Can go as high as 40 mg daily. Of note, she has not been able to tolerate Imdur due to HAs.   Also at last OV w/  Dr. Meda Coffee, she complained of exertional chest tightness with radiation to her arm. She was scheduled for coronary CTA however her heart rate was over 100 and she developed significant rash to IV metoprolol. Dr. Meda Coffee changed study to stress test.   Stress test was done on 04/20/18. Stress test showed no ischemia or infarction. EF was 41%, c/w with echo done in Dec.   She continues to have occasional chest pressure, which likely is 2/2 HTN and possible microvascular disease. She is currently CP free in clinic today. BP is elevated at 180/80. She reports full med compliance. She is wearing her clonidine patch. She took her meds about 2 hrs ago.   Cardiac Studies    NST 04/20/18 Study Highlights     The left ventricular ejection fraction is moderately decreased (30-44%).  Nuclear stress EF: 41%.  There was no ST segment deviation noted during stress.  This is an intermediate risk study.   Abnormal, intermediate risk stress nuclear study with no ischemia or infarction; EF 41 with global hypokinesis and mild LVE; study intermediate risk due to reduced LV function.    Notes recorded by [name], MD on 04/20/2018 at 5:14 PM EST She has mildly decreased LVEF, that we already knew about, but no ischemia, normal stress test.  2D Echo 03/2018 Study Conclusions  - Left ventricle: The cavity size was normal. Wall thickness was   increased in a pattern of moderate LVH. Systolic function was   mildly reduced. The estimated ejection fraction was in the range   of 45% to 50%. Wall motion was normal; there were no regional   wall motion abnormalities. Features are consistent with a   pseudonormal left ventricular filling pattern, with concomitant   abnormal relaxation and increased filling pressure (grade 2   diastolic dysfunction). - Mitral valve: There was mild regurgitation. - Pulmonary arteries: Systolic pressure was mildly increased. PA   peak pressure: 33 mm Hg (S).  Current Meds  Medication Sig  . acetaZOLAMIDE (DIAMOX) 250 MG tablet TAKE 4 TABLETS BY MOUTH 3 (THREE) TIMES DAILY.  Marland Kitchen albuterol (PROVENTIL) (2.5 MG/3ML) 0.083% nebulizer solution USE 1 VIAL VIA NEBULIZER EVERY 4  HOURS AND AS NEEDED FOR WHEEZING OR SHORTNESS OF BREATH  . amitriptyline (ELAVIL) 100 MG tablet Take 1 tablet (100 mg total) by mouth at bedtime.  Marland Kitchen amLODipine (NORVASC) 10 MG tablet Take 1 tablet (10 mg total) by mouth daily.  . Cholecalciferol 50 MCG (2000 UT) TABS Take 2 tablets (4,000 Units total) by mouth daily.  . cloNIDine (CATAPRES - DOSED IN MG/24 HR) 0.3 mg/24hr patch Place 1 patch (0.3 mg total) onto the skin once a week.  . cloNIDine (CATAPRES)  0.3 MG tablet TAKE 1 TABLET BY MOUTH TWICE A DAY  . clotrimazole (MYCELEX) 10 MG troche Take 1 tablet (10 mg total) by mouth 5 (five) times daily.  Marland Kitchen dexlansoprazole (DEXILANT) 60 MG capsule Take 1 capsule (60 mg total) by mouth daily.  Marland Kitchen dextromethorphan-guaiFENesin (MUCINEX DM) 30-600 MG 12hr tablet Take 1 tablet by mouth 2 (two) times daily.   Marland Kitchen dicyclomine (BENTYL) 20 MG tablet TAKE 1 TABLET BY MOUTH 3 TIMES DAILY AS NEEDED FOR SPASMS  . diltiazem (CARDIZEM LA) 420 MG 24 hr tablet Take 1 tablet (420 mg total) by mouth daily.  . DULoxetine (CYMBALTA) 30 MG capsule Take 1 capsule (30 mg total) by mouth daily. With 60mg  for total of 90 mg  . DULoxetine (CYMBALTA) 60 MG capsule Take 1 capsule (60 mg total) by mouth daily. For total of 90 mg  . EPINEPHRINE 0.3 mg/0.3 mL IJ SOAJ injection INJECT 0.3 MLS INTO THE MUSCLE ONCE AS NEEDED FOR ANAPHYLAXIS  . furosemide (LASIX) 40 MG tablet TAKE 1 TABLET BY MOUTH TWICE A DAY  . hydrALAZINE (APRESOLINE) 100 MG tablet TAKE 1 TABLET (100 MG TOTAL) BY MOUTH 3 (THREE) TIMES DAILY.  Marland Kitchen lidocaine-prilocaine (EMLA) cream Apply 1 application topically. Apply 1 application to skin ever 28 days  . metoCLOPramide (REGLAN) 10 MG tablet TAKE 1 TABLET BY MOUTH 4 TIMES A DAY WITH MEALS AND AT BEDTIME  . metoprolol tartrate (LOPRESSOR) 100 MG tablet Take 1 tablet (100 mg total) by mouth as directed. Take 1 tablet 1 hour before your test  . montelukast (SINGULAIR) 10 MG tablet TAKE 1 TABLET BY MOUTH ONCE DAILY AT BEDTIME  . NITROSTAT 0.4 MG SL tablet PLACE 1 TABLET UNDER TONGUE EVERY 5 MINUTES AS NEEDED FOR CHEST PAIN, MAX OF 3 TABLETS THEN CALL 911 IF PAIN PERSISTS  . OXYGEN Use CPAP at bedtime  . oxymetazoline (MUCINEX NASAL SPRAY FULL FORCE) 0.05 % nasal spray Place 1 spray into both nostrils 2 (two) times daily.  . potassium chloride SA (K-DUR,KLOR-CON) 20 MEQ tablet Take 1 tablet (20 mEq total) by mouth 2 (two) times daily.  . primidone (MYSOLINE) 50 MG tablet TAKE 0.5  TABLETS (25 MG TOTAL) BY MOUTH AT BEDTIME.  . promethazine (PHENERGAN) 25 MG suppository Place 1 suppository (25 mg total) rectally every 6 (six) hours as needed for nausea or vomiting.  . promethazine (PHENERGAN) 25 MG tablet Take 1 tablet (25 mg total) by mouth 3 (three) times daily as needed for nausea or vomiting.  . ranolazine (RANEXA) 500 MG 12 hr tablet TAKE 1 TABLET BY MOUTH TWICE A DAY  . Respiratory Therapy Supplies (FLUTTER) DEVI Use as directed  . rosuvastatin (CRESTOR) 20 MG tablet Take 1 tablet (20 mg total) by mouth daily.  Marland Kitchen Spacer/Aero-Holding Chambers (AEROCHAMBER MV) inhaler Use as instructed  . spironolactone (ALDACTONE) 100 MG tablet TAKE 1 TABLET BY MOUTH EVERY DAY  . Suvorexant (BELSOMRA) 15 MG TABS Take 1 tablet by mouth daily.  Dellis Anes  160-4.5 MCG/ACT inhaler INHALE 2 PUFFS INTO LUNGS 2 TIMES DAILY AS DIRECTED  . traZODone (DESYREL) 150 MG tablet TAKE 1 TABLET BY MOUTH AT BEDTIME  . valsartan (DIOVAN) 320 MG tablet Take 1 tablet (320 mg total) by mouth daily.  . VENTOLIN HFA 108 (90 Base) MCG/ACT inhaler INHALE 2 PUFFS BY MOUTH EVERY 4 HOURS AS NEEDED FOR WHEEZE OR FOR SHORTNESS OF BREATH  . [DISCONTINUED] nebivolol (BYSTOLIC) 10 MG tablet Take 1 tablet (10 mg total) by mouth daily.   Allergies  Allergen Reactions  . Aspirin Shortness Of Breath and Palpitations    Pt can take ibuprofen without reaction  . Doxycycline Anaphylaxis  . Kiwi Extract Itching and Swelling    Lip swelling  . Metoprolol Hives and Itching  . Penicillins Shortness Of Breath and Palpitations    Has patient had a PCN reaction causing immediate rash, facial/tongue/throat swelling, SOB or lightheadedness with hypotension: Yes Has patient had a PCN reaction causing severe rash involving mucus membranes or skin necrosis: Yes Has patient had a PCN reaction that required hospitalization Yes Has patient had a PCN reaction occurring within the last 10 years: No If all of the above answers are  "NO", then may proceed with Cephalosporin use.  . Strawberry Extract Hives  . Sulfamethoxazole-Trimethoprim Anaphylaxis    Facial, throat and tongue swelling with difficulty swallowing.   Past Medical History:  Diagnosis Date  . Asthma   . Breast cancer ([place]) 2010   a. locally advanced left breast carcinoma diagnosed in 2010 and treated with neoadjuvant chemotherapy with docetaxel and cyclophosphamide as well as paclitaxel. This was followed by radiation therapy which was completed in 2011.  Marland Kitchen Dyspnea on exertion    a. 01/2013 Lexi MV: EF 54%, no ischemia/infarct;  b. 05/2013 Echo: EF 60-65%, no rwma, Gr 2 DD;  b.   . Fatty liver   . Gastroparesis   . GERD (gastroesophageal reflux disease)   . Hypertension   . Impaired glucose tolerance 04/13/2011  . Lymphadenitis, chronic    restricted LEFT extremity  . 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 OSA (obstructive sleep apnea) 05/04/2014   AHI 31/hr now on CPAP at 12cm H2O  . Personal history of chemotherapy 2010  . Personal history of radiation therapy 2010   Family History  Problem Relation Age of Onset  . Other Mother 10       breast calcifications treated with surgery, breast cancer pill, and radiation  . Fibroids Sister 104       s/p TAH-BSO  . Diabetes Maternal Grandmother   . Heart disease Maternal Grandmother   . Hypertension Maternal Grandmother   . Breast cancer Other        maternal great aunt (MGM's sister)  . Breast cancer Other        paternal great aunt dx middle ages; s/p mastectomy  . Cancer Neg Hx   . Alcohol abuse Neg Hx   . Early death Neg Hx   . Hyperlipidemia Neg Hx   . Kidney disease Neg Hx   . Stroke Neg Hx   . Colon cancer Neg Hx    Past Surgical History:  Procedure Laterality Date  . BRAIN SURGERY     Shunt  . BREAST LUMPECTOMY Left 09/2008  . CARPAL TUNNEL RELEASE Left 2011  . LYMPH NODE DISSECTION Left 2010   breast; 2 wks after breast lumpectomy  . TENDON RELEASE Right 2012   "hand"  .  TUBAL LIGATION  05/11/2011  Procedure: POST PARTUM TUBAL LIGATION;  Surgeon: Emeterio Reeve, MD;  Location: [facility];  Service: Gynecology;  Laterality: Bilateral;  Induced for HTN   Social History   Socioeconomic History  . Marital status: Single    Spouse name: Not on file  . Number of children: 3  . Years of education: Not on file  . Highest education level: Not on file  Occupational History    Employer: UNEMPLOYED  Social Needs  . Financial resource strain: Not on file  . Food insecurity:    Worry: Not on file    Inability: Not on file  . Transportation needs:    Medical: Not on file    Non-medical: Not on file  Tobacco Use  . Smoking status: Never Smoker  . Smokeless tobacco: Never Used  Substance and Sexual Activity  . Alcohol use: Yes    Alcohol/week: 0.0 standard drinks    Comment: occasionally/socially  . Drug use: No  . Sexual activity: Yes    Birth control/protection: Surgical  Lifestyle  . Physical activity:    Days per week: Not on file    Minutes per session: Not on file  . Stress: Not on file  Relationships  . Social connections:    Talks on phone: Not on file    Gets together: Not on file    Attends religious service: Not on file    Active member of club or organization: Not on file    Attends meetings of clubs or organizations: Not on file    Relationship status: Not on file  . Intimate partner violence:    Fear of current or ex partner: Not on file    Emotionally abused: Not on file    Physically abused: Not on file    Forced sexual activity: Not on file  Other Topics Concern  . Not on file  Social History Narrative   She lives with four children.   She is currently not working (disbaility pending).        Review of Systems: General: negative for chills, fever, night sweats or weight changes.  Cardiovascular: negative for chest pain, dyspnea on exertion, edema, orthopnea, palpitations, paroxysmal nocturnal dyspnea or shortness of  breath Dermatological: negative for rash Respiratory: negative for cough or wheezing Urologic: negative for hematuria Abdominal: negative for nausea, vomiting, diarrhea, bright red blood per rectum, melena, or hematemesis Neurologic: negative for visual changes, syncope, or dizziness All other systems reviewed and are otherwise negative except as noted above.   Physical Exam:  Blood pressure (!) 180/80, pulse (!) 112, height 5\' 6"  (1.676 m), weight 199 lb 3.2 oz (90.4 kg), SpO2 96 %.  General appearance: alert, cooperative and no distress Neck: no carotid bruit and no JVD Lungs: clear to auscultation bilaterally Heart: regular rate and rhythm, S1, S2 normal, no murmur, click, rub or gallop Extremities: extremities normal, atraumatic, no cyanosis or edema Pulses: 2+ and symmetric Skin: Skin color, texture, turgor normal. No rashes or lesions Neurologic: Grossly normal  EKG not performed -- personally reviewed   ASSESSMENT AND PLAN:   1.  Chest pain: nuclear stress test 04/20/18 showed no ischemia. She continues to have occasional chest pressure, which likely is 2/2 HTN and possible microvascular disease. She is currently CP free in clinic today. BP is elevated at 180/80. Will further increase her  blocker. Unfortunately, she cannot tolerate LA nitrates.   2.  Hypertension: Blood pressure is poorly controlled as outlined above.  She is on multiple antihypertensive agents.  Renal artery Dopplers in the past were negative.  She reports full compliance.  Has clonidine patch on currently.  Last took all of her morning medications about 2 hours ago.  As outlined in Dr. Francesca Oman last office visit note, we will further titrate her Bystolic dose to 20 mg daily.  We will have her follow-up again in the hypertension clinic in 2 to 3 weeks for repeat assessment.  Per Dr. Francesca Oman recommendations.  Can further increase Bystolic to 40 mg daily if needed.  3.  Chronic combined systolic and diastolic  heart failure: euvolemic from a volume standpoint. BP high. See above. She reports she tries to avoid salt.   4.  Obstructive sleep apnea: On CPAP.  Followed by Dr. Radford Pax.  5.  Asthma: Followed by Dr. Melvyn Novas.  She reports full compliance with her inhaler regimen including daily compliance with maintenance inhalers, however she continues to have to frequently use albuterol.  She has follow-up with Dr. Melvyn Novas next month.   Follow-Up in hypertension clinic in 2 to 3 weeks.  Rox Mcgriff Ladoris Gene, MHS [facility] 05/18/2018 1:27 PM

## 2018-05-19 NOTE — Telephone Encounter (Signed)
Patient is scheduled for 4-6 week follow up on 07/27/18. Pt is aware and agreeable to treatment.

## 2018-05-28 IMAGING — CR DG FOOT COMPLETE 3+V*R*
3 series · 3 of 3 positions shown · non-contrast
Comparison: Right foot series of March 22, 2013

CLINICAL DATA: One day of generalized right foot pain with no known
injury

EXAM:
RIGHT FOOT COMPLETE - 3+ VIEW

[foot ap]
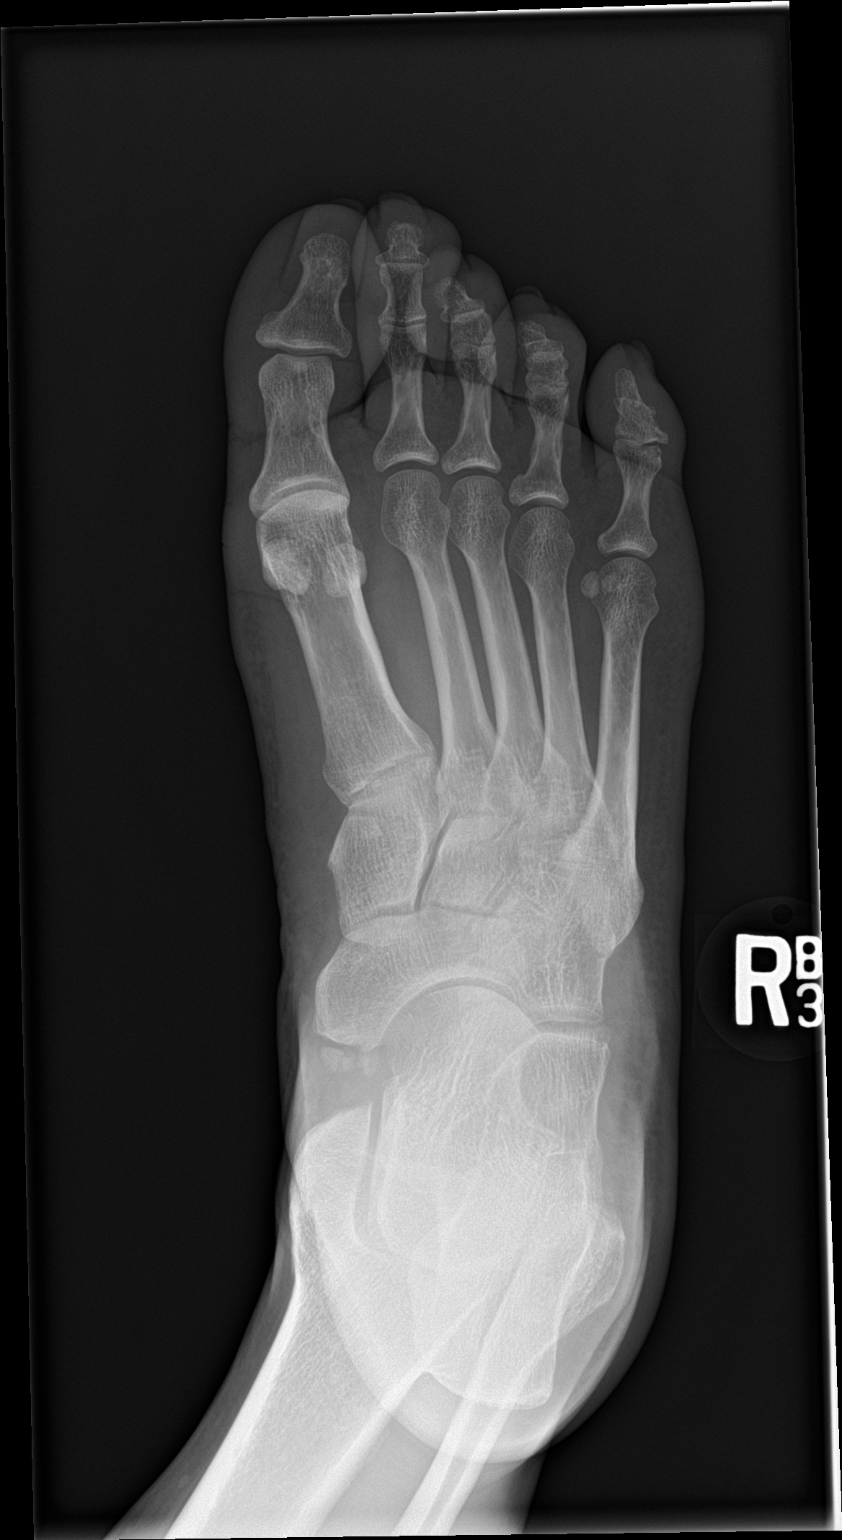

[foot obl]
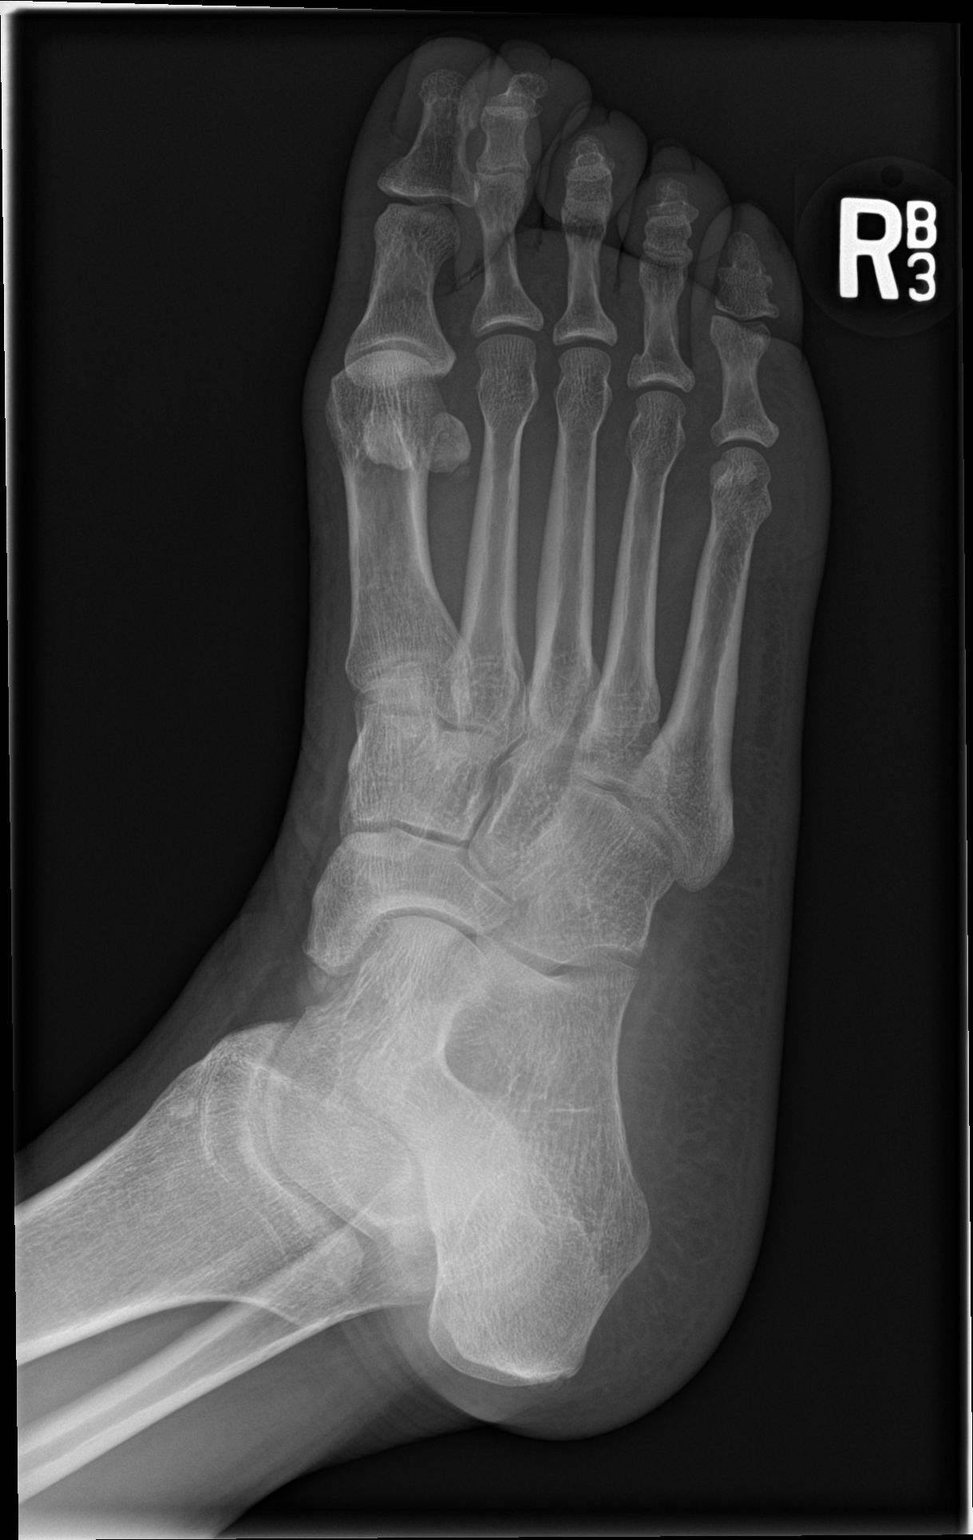

[foot lat]
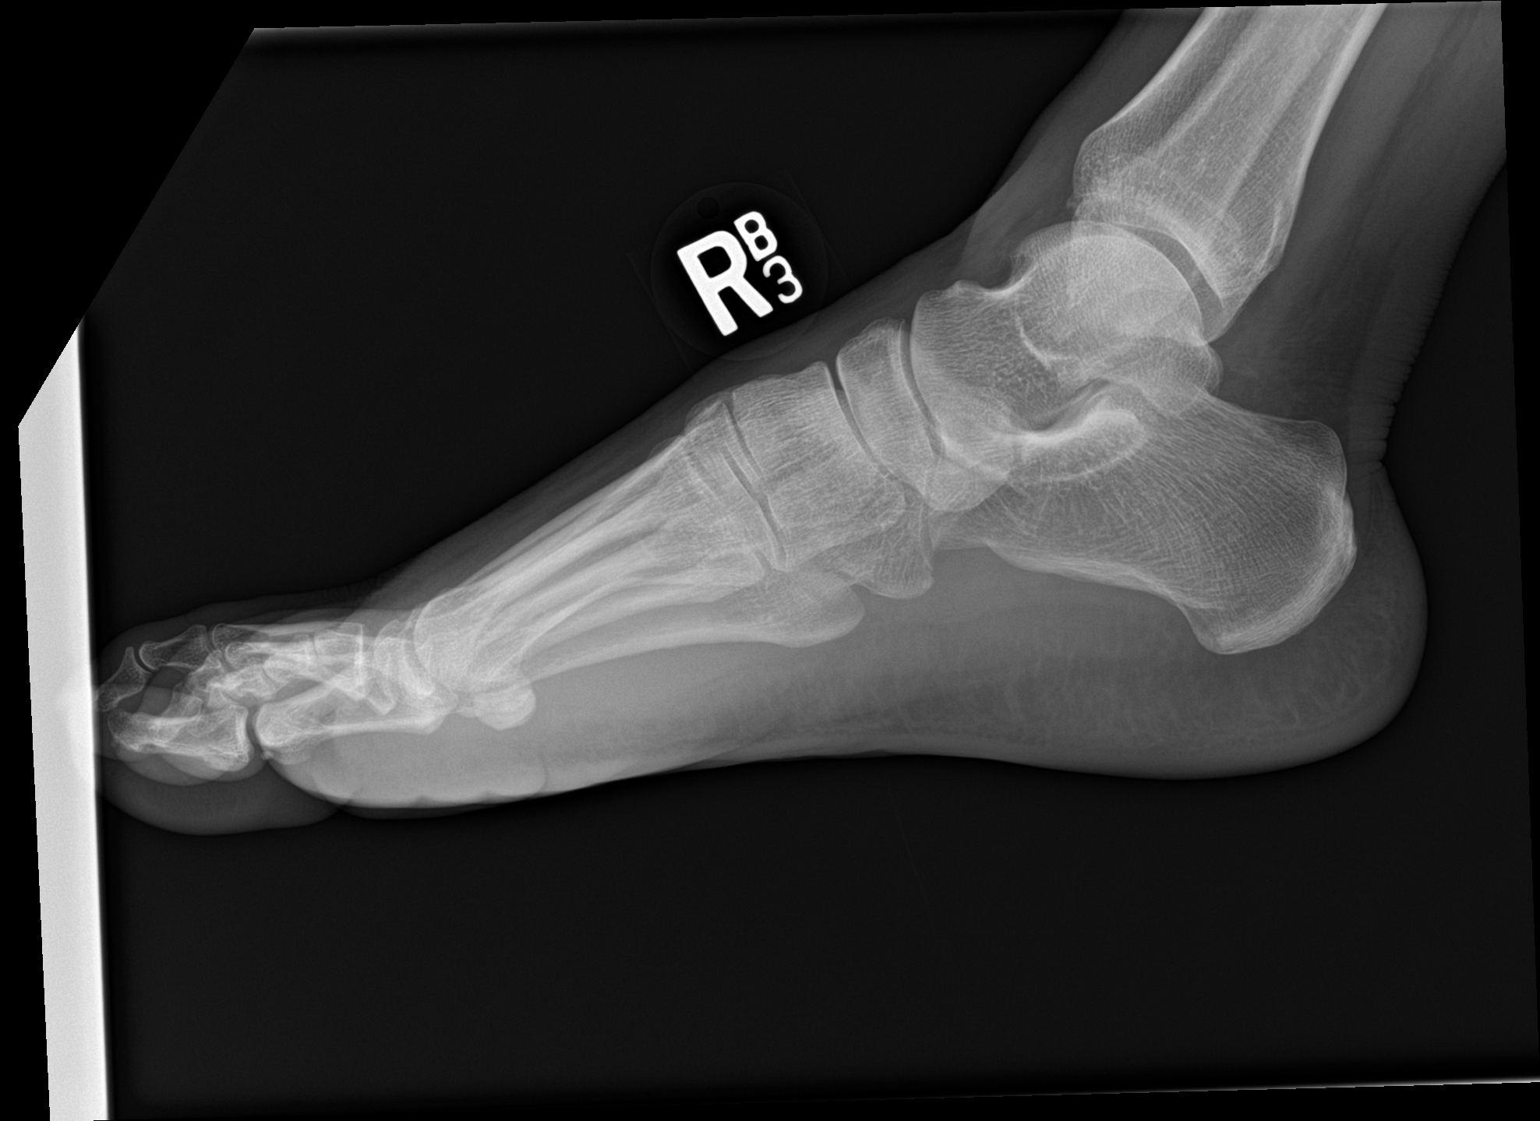

[3 of 3 positions shown; findings below may reference images not displayed]

FINDINGS: The bones are subjectively adequately mineralized. No acute fracture
or dislocation is observed. The joint spaces are well maintained.
The soft tissues are unremarkable.
IMPRESSION: There is no acute or significant chronic bony abnormality of the
right foot.

## 2018-05-31 ENCOUNTER — Other Ambulatory Visit: Payer: Self-pay | Admitting: Cardiology

## 2018-05-31 ENCOUNTER — Other Ambulatory Visit: Payer: Self-pay | Admitting: Internal Medicine

## 2018-05-31 ENCOUNTER — Other Ambulatory Visit: Payer: Self-pay | Admitting: Neurology

## 2018-06-01 ENCOUNTER — Ambulatory Visit (INDEPENDENT_AMBULATORY_CARE_PROVIDER_SITE_OTHER): Payer: Medicare HMO | Admitting: Cardiology

## 2018-06-01 ENCOUNTER — Encounter: Payer: Self-pay | Admitting: Cardiology

## 2018-06-01 VITALS — BP 156/96 | HR 113 | Ht 66.0 in | Wt 200.0 lb

## 2018-06-01 DIAGNOSIS — I10 Essential (primary) hypertension: Secondary | ICD-10-CM

## 2018-06-01 DIAGNOSIS — E669 Obesity, unspecified: Secondary | ICD-10-CM

## 2018-06-01 DIAGNOSIS — G4733 Obstructive sleep apnea (adult) (pediatric): Secondary | ICD-10-CM | POA: Diagnosis not present

## 2018-06-01 NOTE — Progress Notes (Signed)
Cardiology Office Note:    Date:  06/01/2018   ID:  Erica [name], DOB [birth year], MRN [id number]  PCP:  [name], MD  Cardiologist:  Ena Dawley, MD    Referring MD: [name], MD   Chief Complaint  Patient presents with  . Sleep Apnea    History of Present Illness:    Erica [name] is a 43 y.o. female with a hx of severe OSA with an AHI of 31/hr mainly in NREM supine sleep with successful CPAP titration to 12cm H2O in 2016.  She is doing well with her CPAP device and thinks that she has gotten used to it.  She tolerates the mask and feels the pressure is adequate.  Since going on CPAP she needs to have problems tolerating her device.  She does not feel like she is getting enough air even on the auto CPAP.  On auto CPAP her download showed an AHI of 7.8 with only 10% usage for 4 hours or more and we 33% usage over all.  She is requiring at least 14 to 15 cm H2O and still with apneas.  She says she wakes up feeling like she cannot breathe at night and will also have a lot of mucus in her throat.  Past Medical History:  Diagnosis Date  . Asthma   . Breast cancer ([place]) 2010   a. locally advanced left breast carcinoma diagnosed in 2010 and treated with neoadjuvant chemotherapy with docetaxel and cyclophosphamide as well as paclitaxel. This was followed by radiation therapy which was completed in 2011.  Marland Kitchen Dyspnea on exertion    a. 01/2013 Lexi MV: EF 54%, no ischemia/infarct;  b. 05/2013 Echo: EF 60-65%, no rwma, Gr 2 DD;  b.   . Fatty liver   . Gastroparesis   . GERD (gastroesophageal reflux disease)   . Hypertension   . Impaired glucose tolerance 04/13/2011  . Lymphadenitis, chronic    restricted LEFT extremity  . 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 OSA (obstructive sleep apnea) 05/04/2014   AHI 31/hr now on CPAP at 12cm H2O  . Personal history of chemotherapy 2010  . Personal history of radiation therapy 2010    Past Surgical History:  Procedure Laterality  Date  . BRAIN SURGERY     [place]  . BREAST LUMPECTOMY Left 09/2008  . CARPAL TUNNEL RELEASE Left 2011  . LYMPH NODE DISSECTION Left 2010   breast; 2 wks after breast lumpectomy  . TENDON RELEASE Right 2012   "hand"  . TUBAL LIGATION  05/11/2011   Procedure: POST PARTUM TUBAL LIGATION;  Surgeon: Emeterio Reeve, MD;  Location: [facility];  Service: Gynecology;  Laterality: Bilateral;  Induced for HTN    Current Medications: Current Meds  Medication Sig  . acetaZOLAMIDE (DIAMOX) 250 MG tablet TAKE 4 TABLETS BY MOUTH 3 (THREE) TIMES DAILY.  Marland Kitchen albuterol (PROVENTIL) (2.5 MG/3ML) 0.083% nebulizer solution USE 1 VIAL VIA NEBULIZER EVERY 4 HOURS AND AS NEEDED FOR WHEEZING OR SHORTNESS OF BREATH  . amitriptyline (ELAVIL) 100 MG tablet Take 1 tablet (100 mg total) by mouth at bedtime.  Marland Kitchen amLODipine (NORVASC) 10 MG tablet Take 1 tablet (10 mg total) by mouth daily.  . Cholecalciferol 50 MCG (2000 UT) TABS Take 2 tablets (4,000 Units total) by mouth daily.  . cloNIDine (CATAPRES - DOSED IN MG/24 HR) 0.3 mg/24hr patch Place 1 patch (0.3 mg total) onto the skin once a week.  . cloNIDine (CATAPRES) 0.3 MG tablet  TAKE 1 TABLET BY MOUTH TWICE A DAY  . clotrimazole (MYCELEX) 10 MG troche Take 1 tablet (10 mg total) by mouth 5 (five) times daily.  Marland Kitchen dexlansoprazole (DEXILANT) 60 MG capsule Take 1 capsule (60 mg total) by mouth daily.  Marland Kitchen dextromethorphan-guaiFENesin (MUCINEX DM) 30-600 MG 12hr tablet Take 1 tablet by mouth 2 (two) times daily.   Marland Kitchen dicyclomine (BENTYL) 20 MG tablet TAKE 1 TABLET BY MOUTH 3 TIMES DAILY AS NEEDED FOR SPASMS  . diltiazem (CARDIZEM LA) 420 MG 24 hr tablet Take 1 tablet (420 mg total) by mouth daily.  . DULoxetine (CYMBALTA) 30 MG capsule Take 1 capsule (30 mg total) by mouth daily. With 60mg  for total of 90 mg  . DULoxetine (CYMBALTA) 60 MG capsule Take 1 capsule (60 mg total) by mouth daily. For total of 90 mg  . EPINEPHRINE 0.3 mg/0.3 mL IJ SOAJ injection INJECT 0.3 MLS INTO THE MUSCLE  ONCE AS NEEDED FOR ANAPHYLAXIS  . furosemide (LASIX) 40 MG tablet TAKE 1 TABLET BY MOUTH TWICE A DAY  . hydrALAZINE (APRESOLINE) 100 MG tablet TAKE 1 TABLET (100 MG TOTAL) BY MOUTH 3 (THREE) TIMES DAILY.  Marland Kitchen lidocaine-prilocaine (EMLA) cream Apply 1 application topically. Apply 1 application to skin ever 28 days  . metoCLOPramide (REGLAN) 10 MG tablet TAKE 1 TABLET BY MOUTH 4 TIMES A DAY WITH MEALS AND AT BEDTIME  . metoprolol tartrate (LOPRESSOR) 100 MG tablet Take 1 tablet (100 mg total) by mouth as directed. Take 1 tablet 1 hour before your test  . montelukast (SINGULAIR) 10 MG tablet TAKE 1 TABLET BY MOUTH ONCE DAILY AT BEDTIME  . Nebivolol HCl 20 MG TABS Take 1 tablet (20 mg total) by mouth daily.  Marland Kitchen NITROSTAT 0.4 MG SL tablet PLACE 1 TABLET UNDER TONGUE EVERY 5 MINUTES AS NEEDED FOR CHEST PAIN, MAX OF 3 TABLETS THEN CALL 911 IF PAIN PERSISTS  . OXYGEN Use CPAP at bedtime  . oxymetazoline (MUCINEX NASAL SPRAY FULL FORCE) 0.05 % nasal spray Place 1 spray into both nostrils 2 (two) times daily.  . potassium chloride SA (K-DUR,KLOR-CON) 20 MEQ tablet Take 1 tablet (20 mEq total) by mouth 2 (two) times daily.  . primidone (MYSOLINE) 50 MG tablet TAKE 0.5 TABLETS (25 MG TOTAL) BY MOUTH AT BEDTIME.  . promethazine (PHENERGAN) 25 MG suppository Place 1 suppository (25 mg total) rectally every 6 (six) hours as needed for nausea or vomiting.  . promethazine (PHENERGAN) 25 MG tablet Take 1 tablet (25 mg total) by mouth 3 (three) times daily as needed for nausea or vomiting.  Marland Kitchen Respiratory Therapy Supplies (FLUTTER) DEVI Use as directed  . rosuvastatin (CRESTOR) 20 MG tablet Take 1 tablet (20 mg total) by mouth daily.  Marland Kitchen Spacer/Aero-Holding Chambers (AEROCHAMBER MV) inhaler Use as instructed  . spironolactone (ALDACTONE) 100 MG tablet TAKE 1 TABLET BY MOUTH EVERY DAY  . Suvorexant (BELSOMRA) 15 MG TABS Take 1 tablet by mouth daily.  . SYMBICORT 160-4.5 MCG/ACT inhaler INHALE 2 PUFFS INTO LUNGS 2 TIMES  DAILY AS DIRECTED  . traZODone (DESYREL) 150 MG tablet TAKE 1 TABLET BY MOUTH AT BEDTIME  . valsartan (DIOVAN) 320 MG tablet Take 1 tablet (320 mg total) by mouth daily.  . VENTOLIN HFA 108 (90 Base) MCG/ACT inhaler INHALE 2 PUFFS BY MOUTH EVERY 4 HOURS AS NEEDED FOR WHEEZE OR FOR SHORTNESS OF BREATH  . [DISCONTINUED] ranolazine (RANEXA) 500 MG 12 hr tablet TAKE 1 TABLET BY MOUTH TWICE A DAY     Allergies:  Aspirin; Doxycycline; Kiwi extract; Metoprolol; Penicillins; Strawberry extract; and Sulfamethoxazole-trimethoprim   Social History   Socioeconomic History  . Marital status: Single    Spouse name: Not on file  . Number of children: 3  . Years of education: Not on file  . Highest education level: Not on file  Occupational History    Employer: UNEMPLOYED  Social Needs  . Financial resource strain: Not on file  . Food insecurity:    Worry: Not on file    Inability: Not on file  . Transportation needs:    Medical: Not on file    Non-medical: Not on file  Tobacco Use  . Smoking status: Never Smoker  . Smokeless tobacco: Never Used  Substance and Sexual Activity  . Alcohol use: Yes    Alcohol/week: 0.0 standard drinks    Comment: occasionally/socially  . Drug use: No  . Sexual activity: Yes    Birth control/protection: Surgical  Lifestyle  . Physical activity:    Days per week: Not on file    Minutes per session: Not on file  . Stress: Not on file  Relationships  . Social connections:    Talks on phone: Not on file    Gets together: Not on file    Attends religious service: Not on file    Active member of club or organization: Not on file    Attends meetings of clubs or organizations: Not on file    Relationship status: Not on file  Other Topics Concern  . Not on file  Social History Narrative   She lives with four children.   She is currently not working (disbaility pending).        Family History: The patient's family history includes Breast cancer in some  other family members; Diabetes in her maternal grandmother; Fibroids (age of onset: 77) in her sister; Heart disease in her maternal grandmother; Hypertension in her maternal grandmother; Other (age of onset: 36) in her mother. There is no history of Cancer, Alcohol abuse, Early death, Hyperlipidemia, Kidney disease, Stroke, or Colon cancer.  ROS:   Please see the history of present illness.    ROS  All other systems reviewed and negative.   EKGs/Labs/Other Studies Reviewed:    The following studies were reviewed today: PAP download  EKG:  EKG is not ordered today.    Recent Labs: 03/13/2018: Hemoglobin 14.0; Platelets 215.0 03/30/2018: ALT 26; Magnesium 2.0; TSH 1.53 04/06/2018: BUN 12; Creatinine, Ser 0.98; Potassium 3.8; Sodium 144   Recent Lipid Panel    Component Value Date/Time   CHOL 209 (H) 03/30/2018 1604   TRIG 204.0 (H) 03/30/2018 1604   HDL 38.90 (L) 03/30/2018 1604   CHOLHDL 5 03/30/2018 1604   VLDL 40.8 (H) 03/30/2018 1604   LDLCALC 114 (H) 10/15/2016 1726   LDLDIRECT 152.0 03/30/2018 1604    Physical Exam:    VS:  BP (!) 156/96   Pulse (!) 113   Ht 5\' 6"  (1.676 m)   Wt 200 lb (90.7 kg)   SpO2 98%   BMI 32.28 kg/m     Wt Readings from Last 3 Encounters:  06/01/18 200 lb (90.7 kg)  05/18/18 199 lb 3.2 oz (90.4 kg)  05/05/18 200 lb (90.7 kg)     GEN:  Well nourished, well developed in no acute distress HEENT: Normal NECK: No JVD; No carotid bruits LYMPHATICS: No lymphadenopathy CARDIAC: RRR, no murmurs, rubs, gallops RESPIRATORY: Scattered rhonchi from recent URI ABDOMEN: Soft, non-tender, non-distended MUSCULOSKELETAL:  No edema; No deformity  SKIN: Warm and dry NEUROLOGIC:  Alert and oriented x 3 PSYCHIATRIC:  Normal affect   ASSESSMENT:    1. Obstructive sleep apnea syndrome   2. Essential hypertension   3. Mild obesity    PLAN:    In order of problems listed above:  1.  OSA - the patient continues to have problems using CPAP and that  she feels like she is not getting enough air at night and will wake up with a lot of mucus in her chest.  She has had a recent URI.  She is not compliant with her device because she does not tolerate it.  The PAP download was reviewed today and showed an AHI of 7.8/hr on auto PAP with 10% compliance in using more than 4 hours nightly.  Recommended that since she is intolerant to CPAP that we send her back to the sleep lab for a BiPAP titration and hopefully she will tolerate this better.  I have encouraged her to follow-up with her PCP regarding possible reflux already on PPI medicine.  2.  HTN -BP is controlled today on exam.  She will continue on amlodipine 10 mg daily, clonidine patch 0.3 mg per 24 hours, clonidine 0.3 mg tablet twice daily, Cardizem CD 420 mg daily, hydralazine 100 mg 3 times daily, Bystolic 20 mg daily and spironolactone 100 mg daily and valsartan 320 mg daily.  3.  Obesity - I have encouraged her to get into a routine exercise program and cut back on carbs and portions.    Medication Adjustments/Labs and Tests Ordered: Current medicines are reviewed at length with the patient today.  Concerns regarding medicines are outlined above.  No orders of the defined types were placed in this encounter.  No orders of the defined types were placed in this encounter.   Signed, Fransico Him, MD  06/01/2018 2:01 PM    [facility]

## 2018-06-01 NOTE — Telephone Encounter (Signed)
Per Dr Radford Pax in lab Bipap titration order placed today to precert.

## 2018-06-02 ENCOUNTER — Telehealth: Payer: Self-pay | Admitting: *Deleted

## 2018-06-02 NOTE — Telephone Encounter (Signed)
-----   Message from Freada Bergeron, [place] sent at 06/01/2018  6:17 PM EST ----- Regarding: precert Bipap titration

## 2018-06-02 NOTE — Telephone Encounter (Signed)
PA submitted to [facility] for BIPAP titration via web portal.

## 2018-06-03 ENCOUNTER — Inpatient Hospital Stay: Payer: Medicare HMO | Attending: Oncology

## 2018-06-03 ENCOUNTER — Other Ambulatory Visit: Payer: Self-pay | Admitting: Cardiology

## 2018-06-03 VITALS — BP 190/100 | HR 99

## 2018-06-03 DIAGNOSIS — C50412 Malignant neoplasm of upper-outer quadrant of left female breast: Secondary | ICD-10-CM | POA: Diagnosis not present

## 2018-06-03 DIAGNOSIS — Z5111 Encounter for antineoplastic chemotherapy: Secondary | ICD-10-CM | POA: Insufficient documentation

## 2018-06-03 MED ORDER — GOSERELIN ACETATE 3.6 MG ~~LOC~~ IMPL
DRUG_IMPLANT | SUBCUTANEOUS | Status: AC
  Filled 2018-06-03: qty 3.6

## 2018-06-03 MED ORDER — GOSERELIN ACETATE 3.6 MG ~~LOC~~ IMPL
3.6000 mg | DRUG_IMPLANT | SUBCUTANEOUS | Status: DC
  Administered 2018-06-03: 3.6 mg via SUBCUTANEOUS

## 2018-06-03 NOTE — Addendum Note (Signed)
Addended by: Freada Bergeron on: 06/03/2018 10:39 AM   Modules accepted: Orders

## 2018-06-03 NOTE — Patient Instructions (Signed)
Goserelin injection What is this medicine? GOSERELIN (GOE se rel in) is similar to a hormone found in the body. It lowers the amount of sex hormones that the body makes. Men will have lower testosterone levels and women will have lower estrogen levels while taking this medicine. In men, this medicine is used to treat prostate cancer; the injection is either given once per month or once every 12 weeks. A once per month injection (only) is used to treat women with endometriosis, dysfunctional uterine bleeding, or advanced breast cancer. This medicine may be used for other purposes; ask your health care provider or pharmacist if you have questions. COMMON BRAND NAME(S): Zoladex What should I tell my health care provider before I take this medicine? They need to know if you have any of these conditions (some only apply to women): -diabetes -heart disease or previous heart attack -high blood pressure -high cholesterol -kidney disease -osteoporosis or low bone density -problems passing urine -spinal cord injury -stroke -tobacco smoker -an unusual or allergic reaction to goserelin, hormone therapy, other medicines, foods, dyes, or preservatives -pregnant or trying to get pregnant -breast-feeding How should I use this medicine? This medicine is for injection under the skin. It is given by a health care professional in a hospital or clinic setting. Men receive this injection once every 4 weeks or once every 12 weeks. Women will only receive the once every 4 weeks injection. Talk to your pediatrician regarding the use of this medicine in children. Special care may be needed. Overdosage: If you think you have taken too much of this medicine contact a poison control center or emergency room at once. NOTE: This medicine is only for you. Do not share this medicine with others. What if I miss a dose? It is important not to miss your dose. Call your doctor or health care professional if you are unable to  keep an appointment. What may interact with this medicine? -female hormones like estrogen -herbal or dietary supplements like black cohosh, chasteberry, or DHEA -female hormones like testosterone -prasterone This list may not describe all possible interactions. Give your health care provider a list of all the medicines, herbs, non-prescription drugs, or dietary supplements you use. Also tell them if you smoke, drink alcohol, or use illegal drugs. Some items may interact with your medicine. What should I watch for while using this medicine? Visit your doctor or health care professional for regular checks on your progress. Your symptoms may appear to get worse during the first weeks of this therapy. Tell your doctor or healthcare professional if your symptoms do not start to get better or if they get worse after this time. Your bones may get weaker if you take this medicine for a long time. If you smoke or frequently drink alcohol you may increase your risk of bone loss. A family history of osteoporosis, chronic use of drugs for seizures (convulsions), or corticosteroids can also increase your risk of bone loss. Talk to your doctor about how to keep your bones strong. This medicine should stop regular monthly menstration in women. Tell your doctor if you continue to menstrate. Women should not become pregnant while taking this medicine or for 12 weeks after stopping this medicine. Women should inform their doctor if they wish to become pregnant or think they might be pregnant. There is a potential for serious side effects to an unborn child. Talk to your health care professional or pharmacist for more information. Do not breast-feed an infant while taking   this medicine. Men should inform their doctors if they wish to father a child. This medicine may lower sperm counts. Talk to your health care professional or pharmacist for more information. What side effects may I notice from receiving this  medicine? Side effects that you should report to your doctor or health care professional as soon as possible: -allergic reactions like skin rash, itching or hives, swelling of the face, lips, or tongue -bone pain -breathing problems -changes in vision -chest pain -feeling faint or lightheaded, falls -fever, chills -pain, swelling, warmth in the leg -pain, tingling, numbness in the hands or feet -signs and symptoms of low blood pressure like dizziness; feeling faint or lightheaded, falls; unusually weak or tired -stomach pain -swelling of the ankles, feet, hands -trouble passing urine or change in the amount of urine -unusually high or low blood pressure -unusually weak or tired Side effects that usually do not require medical attention (report to your doctor or health care professional if they continue or are bothersome): -change in sex drive or performance -changes in breast size in both males and females -changes in emotions or moods -headache -hot flashes -irritation at site where injected -loss of appetite -skin problems like acne, dry skin -vaginal dryness This list may not describe all possible side effects. Call your doctor for medical advice about side effects. You may report side effects to FDA at 1-800-FDA-1088. Where should I keep my medicine? This drug is given in a hospital or clinic and will not be stored at home. NOTE: This sheet is a summary. It may not cover all possible information. If you have questions about this medicine, talk to your doctor, pharmacist, or health care provider.  2019 Elsevier/Gold Standard (2013-05-25 11:10:35)  

## 2018-06-04 ENCOUNTER — Other Ambulatory Visit: Payer: Self-pay | Admitting: Internal Medicine

## 2018-06-04 ENCOUNTER — Other Ambulatory Visit: Payer: Self-pay | Admitting: Cardiology

## 2018-06-05 ENCOUNTER — Encounter: Payer: Self-pay | Admitting: Internal Medicine

## 2018-06-05 ENCOUNTER — Telehealth: Payer: Self-pay | Admitting: *Deleted

## 2018-06-05 ENCOUNTER — Ambulatory Visit (INDEPENDENT_AMBULATORY_CARE_PROVIDER_SITE_OTHER): Payer: Medicare HMO | Admitting: Internal Medicine

## 2018-06-05 DIAGNOSIS — R05 Cough: Secondary | ICD-10-CM

## 2018-06-05 DIAGNOSIS — I10 Essential (primary) hypertension: Secondary | ICD-10-CM | POA: Diagnosis not present

## 2018-06-05 DIAGNOSIS — R058 Other specified cough: Secondary | ICD-10-CM

## 2018-06-05 MED ORDER — PREDNISONE 10 MG PO TABS: ORAL_TABLET | ORAL | 0 refills | Status: DC

## 2018-06-05 MED ORDER — AZITHROMYCIN 250 MG PO TABS: ORAL_TABLET | ORAL | 0 refills | Status: DC

## 2018-06-05 MED ORDER — ACETAMINOPHEN-CODEINE #4 300-60 MG PO TABS: 1.0000 | ORAL_TABLET | ORAL | 0 refills | Status: DC | PRN

## 2018-06-05 NOTE — Progress Notes (Addendum)
Subjective:   Patient ID: Erica [name], female     DOB: May 22, 1975    MRN: [id number]    Brief patient profile:  89 yobf with asthma all her life much worse since around 07/2010 referred by Dr [name] to pulmonary clinic in Sept 2012 Hx of Breast Cancer 05/2008    History of Present Illness  12/18/2010 Initial pulmonary office eval on acei and  advair cc dtc asthma x 4 months but baseline = excess saba use "even when better" at least twice daily despite daily advair, worse at hs in fact  used saba 3 x last night.  On chemo /RT for breast ca but last rx was Jan 2012 well before asthma worse.  To er multiple times >  no better with singular added x 3 months. rec Stop Lisinopril Dulera 200 Take 2 puffs first thing in am and then another 2 puffs about 12 hours later.  Work on inhaler technique  Nexium 40 mg   Take 30-60 min before first meal of the day and Pepcid 20 mg one bedtime until return Only use your albuterol as a rescue medication to be used if you can't catch your breath by resting or doing a relaxed purse lip breathing pattern. The less you use it, the better it will work when you need it.  Prednisone 10 mg take  4 each am x 2 days,   2 each am x 2 days,  1 each am x2days and stop    01/03/2011 f/u ov/Erica [name] cc much better off ace and advair but still needing saba twice daily on avg. No  Purulent sputum. rec no change in rx/ rule of 2's reviewed     04/06/2013 NP Follow up and Med Review   We reviewed all her meds and organized them into a med calendar. She does have Medicaid but depends on samples. Did not bring all her meds with her today , says they are at home.  Says her breathing is the same since last ov w/ SOB, Has on /off wheezing, some chest heaviness, prod cough with yellow mucus. Good and bad days. Wears out easily .  Denies f/c/s, nausea, vomiting.  Did not stop singulair as recommended.  rec  Follow med calendar closely and bring to each visit > did not do  We are  referring you for a CPSTwith spirometry before and after> never done   04/18/2014 NP Follow up and Med Review / NP  Patient returns for a two-week follow-up and medication review. We reviewed all her medications organize them into a medication count with patient education It appears that she is taking her medications correctly, however, she has over 30 medications and has had several changes recently. Last visit, her [name] was decreased to the 143mcg says that her breathing was better on the 293mcg Patient had a chest x-ray last visit that showed no acute changes She underwent a CT sinus due to persistent cough , which was positive for sinusitis on the right She was treated with a ten-day course of Avelox, she has one dose left Does feel some better but still has cough . She denies any hemoptysis, orthopnea, PND or leg swelling  Patient has upcoming sleep study. Next week for suspected underlying sleep apnea with snoring and daytime sleepiness rec Refer to ENT for sinusitis and chronic cough > never saw one> f/u sinus CT neg 07/26/14  Increase Dulera 200 2 puffs Twice daily   Follow med calendar closely and  bring to each visit.     08/01/2014 f/u ov/Erica [name] re:  Asthma plus noct "gasping"  Chief Complaint  Patient presents with  . Follow-up    doing well.  no complaints.  not doing well x one year with noct / early  4am with cough/ gasping for air almost always goes to neb and then can't go back to sleep even 30 min later better Daytime in chest tightness resolves  completely while on prednisone last took one month prior to OV  And doesn't have it now but has exact same noct symptoms and overusing saba  Doe x mailbox and back to house out of breath and coughing  >>change dulera 200 to 100   08/30/2014 NP Follow up and Med review : Asthma Patient presents for a one-month follow-up We reviewed all her medications organize them into a medication count with patient education Patient has had  multiple medication changes recently She has been having difficulty with resistant hypertension and visual changes. Recently started on primidone and spironolactone . However, has not started this as of yet That she needs to go pick this up at the pharmacy. Neuro notes reviewed , has been referred to NS . B/p is elevated again at todays visit, discussed follow up with PCP for b/p management.  She tried to decrease her Dulera as recommended from last ov with Dr. Melvyn Novas    however, was unable to and is back on 200 dose .  Says overall her breathing is doing ok  No flare of cough or wheezing  Does have a lot of drainage in throat . Discussed starting on chlortrimeton.  rec Continue Dulera 200 2 puffs Twice daily  , rinse after use  Follow med calendar closely and bring to each visit.  Follow up with Primary MD concerning elevated blood pressure    10/05/2014 f/u ov/Erica [name] re: asthma with component of uacs/ no med calendar  Chief Complaint  Patient presents with  . Follow-up    Here to discuss meds- Dulera not on her drug formulary. Pt states breathing is doing well and denies any new co's today.  turns out using neb at least once every 24 h, esp between 2 and 4 am when wakes up with severe dry cough x months/ pred helps some but never eliminated noct cough rec symbicort 160 Take 2 puffs first thing in am and then another 2 puffs about 12 hours later.  Try taking the tramadol around 2 am to see if helps with the early am cough/ wheeze  See calendar for specific medication instructions and bring it back for each and every office visit  Separating the top medications from the bottom group is fundamental to providing you adequate care going forward.   Please schedule a follow up office visit in 2 weeks, sooner if needed with all medications in 2 separate bags    10/19/2014 f/u ov/Erica [name] re:  Asthma vs uacs/ not using med calendar correclty  Chief Complaint  Patient presents with  . Follow-up     Cough has changed sicnce starting symbicort- prod cough with "dirty" sputum. She states she feels that she can tell when her PM dose is due b/c she starts feeling SOB. She is using proair once per day on average.   not using symbicort 160 correctly  rec Prednisone 10 mg take  4 each am x 2 days,  2 each am x 2 days,  1 each am x 2 days and stop  Bystolic 10  mg take twice daily until return  Take 2 chlortrimeton at bedtime and 2 more 4 hours later  Continue symbicort 160 Take 2 puffs first thing in am and then another 2 puffs about 12 hours later.  Only use your albuterol as rescue  Only use nebulizer if you try proventil first and it fails to work  Care everywhere notes reviewed from neurosurgery from [facility] , recent right frontal VP shunt  rec at d/c 01/25/16 rec d/c reglan/ hycodan for cough            07/07/2017  f/u ov/Erica [name] re: chronic cough/ asthma/ no med calendar/ no meds / on ACEi but hasn't started  Chief Complaint  Patient presents with  . Follow-up    voiced no concerns   Dyspnea:  MMRC3 = can't walk 100 yards even at a slow pace at a flat grade s stopping due to sob   Cough: some/ 4 am is better / taking Tyl #4 around 11 am  Sleep: better but still sev times a week wakes up coughing around 4 am (was q noct previously)  SABA use:  2 x daily / neb 2-3 per week  Using afrin though not on med cal rec  I emphasized that nasal steroids have no immediate benefit  If not better >  Prednisone 10 mg take  4 each am x 2 days,   2 each am x 2 days,  1 each am x 2 days and stop  If still not better call [facility] at [phone] for CT sinus  See calendar for specific medication instructions and bring it back for each and every office visit for every healthcare provider you see.  Without it,  you may not receive the best quality medical care that we feel you deserve. Separating the top medications from the bottom group is fundamental to providing you adequate care going forward.  Please  schedule a follow up office visit in 4 weeks, sooner if needed  with all medications /inhalers/ solutions in hand so we can verify exactly what you are taking. This includes all medications from all doctors and over the counters  - they should be separated like your med calendar in two bags:  Maintenance vs as needed    03/06/2018  f/u ov/Erica [name] re: pseudoasthma Chief Complaint  Patient presents with  . Follow-up    Increased wheezing "when it got really cold".  She is using her albuterol inhaler and neb both a few x per wk.   Dyspnea:  MMRC3 = can't walk 100 yards even at a slow pace at a flat grade s stopping due to sob   Cough: dry cough on acei Sleeping: 30 degrees on cpap SABA use: a few times a week 02: just cpap   Overt hb despite dexilant  rec Stop lisinopril and asmanex  Start diovan 160 mg at bedtime  Clotrimazole troche up to 5 x daily and use spacer for your symbicort but delay the breath after you trigger    06/05/2018  f/u ov/Erica [name] re: psueudoasthma/ hbp/nonadherent with severe polypharmacy/ not using med calendar  Chief Complaint  Patient presents with  . Follow-up    Breathing has been worse over the past 2 wks. She is using her ventolin 8 x per day and neb about 3 x per day.   Baseline  Few times a week saba/ not able to use cpap more than an hour wakes gagging  With over HB dr Hilarie Fredrickson added Ranitidine helps hb but  not gagging did not improve then started 2 weeks prior to OV   stuffy nose/ cough with sltly brownish secretions sneezing / eyes running more need for albuterol but not helping  tyl #4 was being used 4 x weekly but not on list / now on bid and helps but never takes at hs   Dyspnea:  Ok at rest / across the room   Sleeping: poorly, not using tyl #4 or cpap SABA use: way over using now      No obvious patterns in day to day or daytime variability or assoc   mucus plugs or hemoptysis or cp or chest tightness, subjective wheeze or overt sinus or hb symptoms.     Also denies any obvious fluctuation of symptoms with weather or environmental changes or other aggravating or alleviating factors except as outlined above   No unusual exposure hx or h/o childhood pna  or knowledge of premature birth.  Current Allergies, Complete Past Medical History, Past Surgical History, Family History, and Social History were reviewed in [employer] record.  ROS  The following are not active complaints unless bolded Hoarseness, sore throat, dysphagia, dental problems, itching, sneezing,  nasal congestion or discharge of excess mucus or purulent secretions, ear ache,   fever, chills, sweats, unintended wt loss or wt gain, classically pleuritic or exertional cp,  orthopnea pnd or arm/hand swelling  or leg swelling, presyncope, palpitations, abdominal pain, anorexia, nausea, vomiting, diarrhea  or change in bowel habits or change in bladder habits, change in stools or change in urine, dysuria, hematuria,  rash, arthralgias, visual complaints, headache, numbness, weakness or ataxia or problems with walking or coordination,  change in mood= anxious or  memory.        Current Meds - - NOTE:   Unable to verify as accurately reflecting what pt takes     Medication Sig  . acetaZOLAMIDE (DIAMOX) 250 MG tablet TAKE 4 TABLETS BY MOUTH 3 (THREE) TIMES DAILY.  Marland Kitchen albuterol (PROVENTIL) (2.5 MG/3ML) 0.083% nebulizer solution USE 1 VIAL VIA NEBULIZER EVERY 4 HOURS AND AS NEEDED FOR WHEEZING OR SHORTNESS OF BREATH  . amitriptyline (ELAVIL) 100 MG tablet Take 1 tablet (100 mg total) by mouth at bedtime.  Marland Kitchen amLODipine (NORVASC) 10 MG tablet Take 1 tablet (10 mg total) by mouth daily.  . Cholecalciferol 50 MCG (2000 UT) TABS Take 2 tablets (4,000 Units total) by mouth daily.  . cloNIDine (CATAPRES - DOSED IN MG/24 HR) 0.3 mg/24hr patch Place 1 patch (0.3 mg total) onto the skin once a week.  . cloNIDine (CATAPRES) 0.3 MG tablet TAKE 1 TABLET BY MOUTH TWICE A DAY  .  clotrimazole (MYCELEX) 10 MG troche Take 1 tablet (10 mg total) by mouth 5 (five) times daily.  Marland Kitchen DEXILANT 60 MG capsule TAKE 1 CAPSULE BY MOUTH EVERY DAY  . dextromethorphan-guaiFENesin (MUCINEX DM) 30-600 MG 12hr tablet Take 1 tablet by mouth 2 (two) times daily.   Marland Kitchen dicyclomine (BENTYL) 20 MG tablet TAKE 1 TABLET BY MOUTH 3 TIMES DAILY AS NEEDED FOR SPASMS  . diltiazem (CARDIZEM LA) 420 MG 24 hr tablet Take 1 tablet (420 mg total) by mouth daily.  . DULoxetine (CYMBALTA) 30 MG capsule Take 1 capsule (30 mg total) by mouth daily. With 60mg  for total of 90 mg  . DULoxetine (CYMBALTA) 60 MG capsule Take 1 capsule (60 mg total) by mouth daily. For total of 90 mg  . EPINEPHRINE 0.3 mg/0.3 mL IJ SOAJ injection INJECT 0.3 MLS  INTO THE MUSCLE ONCE AS NEEDED FOR ANAPHYLAXIS  . furosemide (LASIX) 40 MG tablet TAKE 1 TABLET BY MOUTH TWICE A DAY  . hydrALAZINE (APRESOLINE) 100 MG tablet TAKE 1 TABLET (100 MG TOTAL) BY MOUTH 3 (THREE) TIMES DAILY.  Marland Kitchen lidocaine-prilocaine (EMLA) cream Apply 1 application topically. Apply 1 application to skin ever 28 days  . metoCLOPramide (REGLAN) 10 MG tablet TAKE 1 TABLET BY MOUTH 4 TIMES A DAY WITH MEALS AND AT BEDTIME  . metoprolol tartrate (LOPRESSOR) 100 MG tablet Take 1 tablet (100 mg total) by mouth as directed. Take 1 tablet 1 hour before your test  . montelukast (SINGULAIR) 10 MG tablet TAKE 1 TABLET BY MOUTH ONCE DAILY AT BEDTIME  . NITROSTAT 0.4 MG SL tablet PLACE 1 TABLET UNDER TONGUE EVERY 5 MINUTES AS NEEDED FOR CHEST PAIN, MAX OF 3 TABLETS THEN CALL 911 IF PAIN PERSISTS  . OXYGEN Use CPAP at bedtime  . oxymetazoline (MUCINEX NASAL SPRAY FULL FORCE) 0.05 % nasal spray Place 1 spray into both nostrils 2 (two) times daily.  . potassium chloride SA (K-DUR,KLOR-CON) 20 MEQ tablet Take 1 tablet (20 mEq total) by mouth 2 (two) times daily.  . primidone (MYSOLINE) 50 MG tablet TAKE 0.5 TABLETS (25 MG TOTAL) BY MOUTH AT BEDTIME.  . promethazine (PHENERGAN) 25 MG  suppository Place 1 suppository (25 mg total) rectally every 6 (six) hours as needed for nausea or vomiting.  . promethazine (PHENERGAN) 25 MG tablet Take 1 tablet (25 mg total) by mouth 3 (three) times daily as needed for nausea or vomiting.  . ranolazine (RANEXA) 500 MG 12 hr tablet TAKE 1 TABLET BY MOUTH TWICE A DAY  . Respiratory Therapy Supplies (FLUTTER) DEVI Use as directed  . rosuvastatin (CRESTOR) 20 MG tablet Take 1 tablet (20 mg total) by mouth daily.  Marland Kitchen Spacer/Aero-Holding Chambers (AEROCHAMBER MV) inhaler Use as instructed  . spironolactone (ALDACTONE) 100 MG tablet TAKE 1 TABLET BY MOUTH EVERY DAY  . Suvorexant (BELSOMRA) 15 MG TABS Take 1 tablet by mouth daily.  . SYMBICORT 160-4.5 MCG/ACT inhaler INHALE 2 PUFFS INTO LUNGS 2 TIMES DAILY AS DIRECTED  . traZODone (DESYREL) 150 MG tablet TAKE 1 TABLET BY MOUTH AT BEDTIME  . valsartan (DIOVAN) 320 MG tablet Take 1 tablet (320 mg total) by mouth daily.  . VENTOLIN HFA 108 (90 Base) MCG/ACT inhaler INHALE 2 PUFFS BY MOUTH EVERY 4 HOURS AS NEEDED FOR WHEEZE OR FOR SHORTNESS OF BREATH                 Objective:   Physical Exam     Obese bf ? Belle affect / harsh upper airway dry sounding coughing fits  Vital signs reviewed - Note on arrival 02 sats  100% on RA and BP   170/100 has not started bystolic, just ran out of clonidine  Wt 213  12/18/10 > 01/03/2011  214 > 01/31/2011  206 >  09/11/2012 209 >208 6/19 > 210 09/25/2012 > 11/20/2012 211 >208 12/07/12> 01/12/2013 209 > 02/09/2013 210 >207 04/06/2013 > 07/14/2013  213>212 07/28/2013 >204 01/26/2014> 04/04/2014 206  >205 04/18/2014 >  07/18/2014 201 >  08/01/14 201 >198 08/30/2014 > 10/05/14  195 > 10/19/2014 192 > 191 11/08/2014 > 02/10/2015  198 > 03/24/2015   198 > 06/29/2015   201 > 08/31/2015  204 > 11/17/2015  197 > 12/15/2015  188 > 03/22/2016   188 > 07/07/2017  198 > 03/06/2018  199  > 06/05/2018  200  HEENT: nl dentition, turbinates bilaterally, and oropharynx. Nl external ear canals without  cough reflex   NECK :  without JVD/Nodes/TM/ nl carotid upstrokes bilaterally   LUNGS: no acc muscle use,  Nl contour chest with mostly upper airway transmitted "wheeze"  bilaterally without cough on insp or exp maneuvers   CV:  RRR  no s3 or murmur or increase in P2, and no edema   ABD:  soft and nontender with nl inspiratory excursion in the supine position. No bruits or organomegaly appreciated, bowel sounds nl  MS:  Nl gait/ ext warm without deformities, calf tenderness, cyanosis or clubbing No obvious joint restrictions   SKIN: warm and dry without lesions    NEURO:  alert, approp, nl sensorium with  no motor or cerebellar deficits apparent.

## 2018-06-05 NOTE — Telephone Encounter (Signed)
Staff message sent to [place] received. Ok to schedule BIPAP titration study. Approval #[id number]. Valid dates 06/30/18 07/30/18.

## 2018-06-05 NOTE — Patient Instructions (Addendum)
zpak   Prednisone 10 mg take  4 each am x 2 days,   2 each am x 2 days,  1 each am x 2 days and stop   For cough > mucinex dm 1200 mg twice daily and supplement with tylenol #4  Every 4 hours and use the flutter valve   Bystolic 20 mg now and daily   Tylenol #4 at bedtime to see if helps cpap  See Tammy NP w/in  2 weeks with all your medications and inhalers and pill organizers, even over the counter meds, separated in two separate bags, the ones you take no matter what vs the ones you stop once you feel better and take only as needed when you feel you need them.   Tammy  will generate for you a new user friendly medication calendar that will put [place] all on the same page re: your medication use.     Without this process, it simply isn't possible to assure that we are providing  your outpatient care  with  the attention to detail we feel you deserve.   If we cannot assure that you're getting that kind of care,  then we cannot manage your problem effectively from this clinic.  Once you have seen Tammy and we are sure that we're all on the same page with your medication use she will arrange follow up with me.

## 2018-06-07 ENCOUNTER — Encounter: Payer: Self-pay | Admitting: Internal Medicine

## 2018-06-07 NOTE — Assessment & Plan Note (Signed)
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 Uri/ poorly controlled GERD   Of the three most common causes of  Sub-acute / recurrent or chronic cough, only one (GERD)  can actually contribute to/ trigger  the other two (asthma and post nasal drip syndrome)  and perpetuate the cylce of cough.  While not intuitively obvious, many patients with chronic low grade reflux do not cough until there is a primary insult that disturbs the protective epithelial barrier and exposes sensitive nerve endings.   This is typically viral but can due to PNDS and  either may apply here.  >>>>   The point is that once this occurs, it is difficult to eliminate the cycle  using anything but a maximally effective acid suppression regimen at least in the short run, accompanied by an appropriate diet to address non acid GERD and control / eliminate the cough itself for at least 3 days with Tyl #4 esp dose at hs to be sure she can tol wearing her cpap and if not needs to be referred to sleep medicine   Also rx for precipitating uri  >> pred x 6 days/ zpak

## 2018-06-07 NOTE — Assessment & Plan Note (Signed)
Very poorly controlled, clearly non-adherent.  Said she was taking all her meds correctly but when asked  "where is your clonidine patch" admitted she hadn't picked it up yet at pharmacy.   Warned directly about the dangers of clonidine rebound and rec start bystolic as prev rec at 20 mg daily and acquire the clonidine asap  She desperately needs full and accurate med reconciliation.   To keep things simple, I have asked the patient to first separate medicines that are perceived as maintenance, that is to be taken daily "no matter what", from those medicines that are taken on only on an as-needed basis and I have given the patient examples of both, and then return to see our NP to generate a  detailed  medication calendar which should be followed until the next physician sees the patient and updates it.    If not willing to do this then I need to withdraw from her care as she is at very high risk of morbidity/mortality from med errors, and I told her this again today noting we've had the same conversation before (without the threat of withdrawing, which I told her I was quite serious about if it will change her behavior)    I had an extended discussion with the patient reviewing all relevant studies completed to date and  lasting 15 to 20 minutes of a 25 minute acute office visit    Each maintenance medication was reviewed in detail including most importantly the difference between maintenance and prns and under what circumstances the prns are to be triggered using an action plan format that is not reflected in the computer generated alphabetically organized AVS.     Please see AVS for specific instructions unique to this visit that I personally wrote and verbalized to the the pt in detail and then reviewed with pt  by my nurse highlighting any  changes in therapy recommended at today's visit to their plan of care.

## 2018-06-08 ENCOUNTER — Telehealth: Payer: Self-pay | Admitting: *Deleted

## 2018-06-08 NOTE — Telephone Encounter (Signed)
-----   Message from Lauralee Evener, [place] sent at 06/05/2018  4:08 PM EST ----- Regarding: RE: [place] received. Ok to schedule BIPAP titration. Auth # [id number]. Valid dates 06/30/18 to 07/30/18. ----- Message ----- From: Freada Bergeron, CMA Sent: 06/01/2018   6:17 PM EST To: [facility] Sleep Studies Subject: precert                                        Bipap titration

## 2018-06-08 NOTE — Telephone Encounter (Signed)
Patient is scheduled for BiPAP Titration on 07/21/18. Patient understands her titration study will be done at [facility] sleep lab. Patient understands she will receive a letter in a week or so detailing appointment, date, time, and location. Patient understands to call if she does not receive the letter  in a timely manner. Patient agrees with treatment and thanked me for call.

## 2018-06-09 ENCOUNTER — Ambulatory Visit (INDEPENDENT_AMBULATORY_CARE_PROVIDER_SITE_OTHER): Payer: Medicare HMO | Admitting: Pharmacist

## 2018-06-09 VITALS — BP 190/90 | HR 95

## 2018-06-09 DIAGNOSIS — I10 Essential (primary) hypertension: Secondary | ICD-10-CM | POA: Diagnosis not present

## 2018-06-09 MED ORDER — NEBIVOLOL HCL 20 MG PO TABS: 40.0000 mg | ORAL_TABLET | Freq: Every day | ORAL | 3 refills | Status: DC

## 2018-06-09 NOTE — Progress Notes (Signed)
Patient ID: Erica [name]                 DOB: 1975/08/01                      MRN: [id number]     HPI: Erica [name] is a 43 y.o. female patient of Dr. Meda Coffee who presents today for hypertension follow up. PMH significant for CHF, HTN, idiopathic intracranial hypertension, asthma, sleep apnea, obesity and hypertriglyceridemia. She also has history of recurrent syncope. The cardio monitor showed only sinus rhythm. 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 Sleep studyshowed severe sleep apnea but she is noncompliant with CPap machine - per patient she is considered noncompliant because she never sleeps more than 4 hours at time. 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 She has had a renal artery scan on 10/13/15 that was negative. She also had a normal TSH in March of 2017. She had repeat RAS on 03/27/17 and no significant renal artery stenosis seen.    She was previously seen in HTN clinic and pressures remained uncontrolled.  At this most recent follow up with Dr. Radford Pax her blood pressure was 156/96, HR 113. She was set up for the sleep lab due to issues tolerating her CPAP. She most recently saw her pulmonologist this week. At that visit she was not wearing her clonidine patch, nor did she pick up her bystolic. He instructed her to separate her medications into two groups, mantiance meds and as needed mediations. She is to see the NP who will generate a medication calendar for her.   She presents today for blood pressure follow up. She is wearing her clonidine patch and states she has been on Bystolic 20mg . Attempted to reconcile medications, patient knew the majority of her medications, but unsure about a few.   Patient complains of fatigue and headaches. She states she does not think headaches are blood pressure related. She cannot tolerate CPAP,feels like she is suffocating. She is scheduled for the sleep lab at the end of April  for titration of BIPAP.   She reports that she has been taking Goserelin monthly. This medication is associated with hypertension in up to 6% of cases. Also of note more commonly this medication is associated with vasodilation.    Patient had questions about her EF and if it could improve. Explained that it is most likely due to persistently elevated blood pressures and we needed to focus on getting her blood pressure and heat rate controlled. Also asking about how she can get more energy. Stated that she needed to work with Dr. Radford Pax and the sleep lab to help improve her sleep/breathing at night.  Current HTN meds:  Furosemide 40mg  BID prn Spironolactone 100mg  daily  Diltiazem 420mg  daily in the morning Amlodipine 10mg  daily in the morning  Hydralazine 100mg  TID Acetazolamide 1000mg  TID Clonidine 0.3mg /24hr patch- weekly Valsartan 320mg  daily Bystolic 20mg  daily  Previously tried: Imdur - worsening headaches, carvedilol (asthma)  BP goal: <130/90  Family History: She states that none of her close family members have known hypertension or other cardiac issues.   Social History: She denies tobacco and endorses only rare social alcohol consumption.   Diet:She eats most of her meals from home and prepares them without salt. She uses pepper and garlic to season. She does endorse 2-3 Pepsis per day and states that without these she becomes increasingly nauseated and the caffeine  helps with her headaches.   Exercise:She tries to walk though she can barely make it 10 minutes without getting SOB. She states she tried P90x once with her daughters and had to use a breathing treatment.   Home BP readings:  160's/90-111 HR 100-118  Wt Readings from Last 3 Encounters:  06/05/18 200 lb (90.7 kg)  06/01/18 200 lb (90.7 kg)  05/18/18 199 lb 3.2 oz (90.4 kg)   BP Readings from Last 3 Encounters:  06/05/18 (!) 170/100  06/03/18 (!) 190/100  06/01/18 (!) 156/96   Pulse Readings from  Last 3 Encounters:  06/05/18 (!) 103  06/03/18 99  06/01/18 (!) 113    Renal function: CrCl cannot be calculated (Patient's most recent lab result is older than the maximum 21 days allowed.).  Past Medical History:  Diagnosis Date  . Asthma   . Breast cancer ([place]) 2010   a. locally advanced left breast carcinoma diagnosed in 2010 and treated with neoadjuvant chemotherapy with docetaxel and cyclophosphamide as well as paclitaxel. This was followed by radiation therapy which was completed in 2011.  Marland Kitchen Dyspnea on exertion    a. 01/2013 Lexi MV: EF 54%, no ischemia/infarct;  b. 05/2013 Echo: EF 60-65%, no rwma, Gr 2 DD;  b.   . Fatty liver   . Gastroparesis   . GERD (gastroesophageal reflux disease)   . Hypertension   . Impaired glucose tolerance 04/13/2011  . Lymphadenitis, chronic    restricted LEFT extremity  . 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 OSA (obstructive sleep apnea) 05/04/2014   AHI 31/hr now on CPAP at 12cm H2O  . Personal history of chemotherapy 2010  . Personal history of radiation therapy 2010    Current Outpatient Medications on File Prior to Visit  Medication Sig Dispense Refill  . acetaminophen-codeine (TYLENOL #4) 300-60 MG tablet Take 1 tablet by mouth every 4 (four) hours as needed for moderate pain. 30 tablet 0  . acetaZOLAMIDE (DIAMOX) 250 MG tablet TAKE 4 TABLETS BY MOUTH 3 (THREE) TIMES DAILY. 1080 tablet 1  . albuterol (PROVENTIL) (2.5 MG/3ML) 0.083% nebulizer solution USE 1 VIAL VIA NEBULIZER EVERY 4 HOURS AND AS NEEDED FOR WHEEZING OR SHORTNESS OF BREATH 75 mL 1  . amitriptyline (ELAVIL) 100 MG tablet Take 1 tablet (100 mg total) by mouth at bedtime. 90 tablet 0  . amLODipine (NORVASC) 10 MG tablet Take 1 tablet (10 mg total) by mouth daily. 90 tablet 0  . azithromycin (ZITHROMAX) 250 MG tablet Take 2 on day one then 1 daily x 4 days 6 tablet 0  . Cholecalciferol 50 MCG (2000 UT) TABS Take 2 tablets (4,000 Units total) by mouth daily. 180 tablet 1  .  cloNIDine (CATAPRES - DOSED IN MG/24 HR) 0.3 mg/24hr patch Place 1 patch (0.3 mg total) onto the skin once a week. 12 patch 1  . clotrimazole (MYCELEX) 10 MG troche Take 1 tablet (10 mg total) by mouth 5 (five) times daily. 30 tablet 11  . DEXILANT 60 MG capsule TAKE 1 CAPSULE BY MOUTH EVERY DAY 90 capsule 3  . dextromethorphan-guaiFENesin (MUCINEX DM) 30-600 MG 12hr tablet Take 1 tablet by mouth 2 (two) times daily.     Marland Kitchen dicyclomine (BENTYL) 20 MG tablet TAKE 1 TABLET BY MOUTH 3 TIMES DAILY AS NEEDED FOR SPASMS 90 tablet 1  . diltiazem (CARDIZEM LA) 420 MG 24 hr tablet Take 1 tablet (420 mg total) by mouth daily. 90 tablet 0  . DULoxetine (CYMBALTA) 30 MG capsule  Take 1 capsule (30 mg total) by mouth daily. With 60mg  for total of 90 mg 30 capsule 1  . DULoxetine (CYMBALTA) 60 MG capsule Take 1 capsule (60 mg total) by mouth daily. For total of 90 mg 30 capsule 1  . EPINEPHRINE 0.3 mg/0.3 mL IJ SOAJ injection INJECT 0.3 MLS INTO THE MUSCLE ONCE AS NEEDED FOR ANAPHYLAXIS 2 Device 2  . furosemide (LASIX) 40 MG tablet TAKE 1 TABLET BY MOUTH TWICE A DAY 180 tablet 0  . hydrALAZINE (APRESOLINE) 100 MG tablet TAKE 1 TABLET (100 MG TOTAL) BY MOUTH 3 (THREE) TIMES DAILY. 270 tablet 2  . lidocaine-prilocaine (EMLA) cream Apply 1 application topically. Apply 1 application to skin ever 28 days    . metoCLOPramide (REGLAN) 10 MG tablet TAKE 1 TABLET BY MOUTH 4 TIMES A DAY WITH MEALS AND AT BEDTIME 360 tablet 3  . montelukast (SINGULAIR) 10 MG tablet TAKE 1 TABLET BY MOUTH ONCE DAILY AT BEDTIME 90 tablet 1  . NITROSTAT 0.4 MG SL tablet PLACE 1 TABLET UNDER TONGUE EVERY 5 MINUTES AS NEEDED FOR CHEST PAIN, MAX OF 3 TABLETS THEN CALL 911 IF PAIN PERSISTS 100 tablet 0  . OXYGEN Use CPAP at bedtime    . oxymetazoline (MUCINEX NASAL SPRAY FULL FORCE) 0.05 % nasal spray Place 1 spray into both nostrils 2 (two) times daily.    . potassium chloride SA (K-DUR,KLOR-CON) 20 MEQ tablet Take 1 tablet (20 mEq total) by mouth  2 (two) times daily. 180 tablet 0  . predniSONE (DELTASONE) 10 MG tablet Take  4 each am x 2 days,   2 each am x 2 days,  1 each am x 2 days and stop 14 tablet 0  . primidone (MYSOLINE) 50 MG tablet TAKE 0.5 TABLETS (25 MG TOTAL) BY MOUTH AT BEDTIME. 45 tablet 1  . promethazine (PHENERGAN) 25 MG suppository Place 1 suppository (25 mg total) rectally every 6 (six) hours as needed for nausea or vomiting. 15 each 1  . promethazine (PHENERGAN) 25 MG tablet Take 1 tablet (25 mg total) by mouth 3 (three) times daily as needed for nausea or vomiting. 270 tablet 1  . ranolazine (RANEXA) 500 MG 12 hr tablet TAKE 1 TABLET BY MOUTH TWICE A DAY 180 tablet 2  . Respiratory Therapy Supplies (FLUTTER) DEVI Use as directed 1 each 0  . rosuvastatin (CRESTOR) 20 MG tablet Take 1 tablet (20 mg total) by mouth daily. 90 tablet 1  . Spacer/Aero-Holding Chambers (AEROCHAMBER MV) inhaler Use as instructed 1 each 0  . spironolactone (ALDACTONE) 100 MG tablet TAKE 1 TABLET BY MOUTH EVERY DAY 90 tablet 0  . Suvorexant (BELSOMRA) 15 MG TABS Take 1 tablet by mouth daily. 30 tablet 5  . SYMBICORT 160-4.5 MCG/ACT inhaler INHALE 2 PUFFS INTO LUNGS 2 TIMES DAILY AS DIRECTED 30.6 Inhaler 1  . traZODone (DESYREL) 150 MG tablet TAKE 1 TABLET BY MOUTH AT BEDTIME 90 tablet 1  . valsartan (DIOVAN) 320 MG tablet Take 1 tablet (320 mg total) by mouth daily. 90 tablet 1  . VENTOLIN HFA 108 (90 Base) MCG/ACT inhaler INHALE 2 PUFFS BY MOUTH EVERY 4 HOURS AS NEEDED FOR WHEEZE OR FOR SHORTNESS OF BREATH 18 Inhaler 1   Current Facility-Administered Medications on File Prior to Visit  Medication Dose Route Frequency Provider Last Rate Last Dose  . regadenoson (LEXISCAN) injection SOLN 0.4 mg  0.4 mg Intravenous Once Lelon Perla, MD      . technetium tetrofosmin (TC-MYOVIEW) injection 29.5 millicurie  84.1 millicurie Intravenous Once PRN Lelon Perla, MD      . technetium tetrofosmin (TC-MYOVIEW) injection 66.0 millicurie  63.0  millicurie Intravenous Once PRN Stanford Breed Denice Bors, MD        Allergies  Allergen Reactions  . Aspirin Shortness Of Breath and Palpitations    Pt can take ibuprofen without reaction  . Doxycycline Anaphylaxis  . Kiwi Extract Itching and Swelling    Lip swelling  . Metoprolol Hives and Itching  . Penicillins Shortness Of Breath and Palpitations    Has patient had a PCN reaction causing immediate rash, facial/tongue/throat swelling, SOB or lightheadedness with hypotension: Yes Has patient had a PCN reaction causing severe rash involving mucus membranes or skin necrosis: Yes Has patient had a PCN reaction that required hospitalization Yes Has patient had a PCN reaction occurring within the last 10 years: No If all of the above answers are "NO", then may proceed with Cephalosporin use.  . Strawberry Extract Hives  . Sulfamethoxazole-Trimethoprim Anaphylaxis    Facial, throat and tongue swelling with difficulty swallowing.    There were no vitals taken for this visit.   Assessment/Plan:  1. Hypertension: Blood pressure above goal today. Patient also consistently tachycardic. Also reports feelings of heart fluttering. Will increase Bystolic to 40mg  daily. Recheck blood pressure in 2 weeks. Patient was asked to bring her medication calendar with her to her next appointment or her medication bottles so we can know exactly what she is taking.   Thank you,  Ramond Dial, Pharm.D, [facility]  [address] [place], [place] [address]  Phone: [phone]; Fax: [phone]  06/09/2018 8:05 AM

## 2018-06-09 NOTE — Patient Instructions (Addendum)
Start taking bystolic 40mg  daily (2 tablets of 20mg ).   Please bring your medication calendar or medication bottles with you to your next appointment  Continue checking your blood pressure at home and bring in your recorded readings and monitor.  Call us at [phone] with any questions or concerns.

## 2018-06-17 ENCOUNTER — Other Ambulatory Visit: Payer: Self-pay | Admitting: Cardiology

## 2018-06-17 ENCOUNTER — Other Ambulatory Visit: Payer: Self-pay | Admitting: Neurology

## 2018-06-17 DIAGNOSIS — I119 Hypertensive heart disease without heart failure: Secondary | ICD-10-CM

## 2018-06-17 DIAGNOSIS — I5032 Chronic diastolic (congestive) heart failure: Secondary | ICD-10-CM

## 2018-06-17 DIAGNOSIS — R072 Precordial pain: Secondary | ICD-10-CM

## 2018-06-19 ENCOUNTER — Ambulatory Visit: Payer: Medicare HMO | Admitting: Adult Health

## 2018-06-19 ENCOUNTER — Encounter: Payer: Self-pay | Admitting: Adult Health

## 2018-06-19 DIAGNOSIS — G4733 Obstructive sleep apnea (adult) (pediatric): Secondary | ICD-10-CM

## 2018-06-19 DIAGNOSIS — J4540 Moderate persistent asthma, uncomplicated: Secondary | ICD-10-CM

## 2018-06-19 DIAGNOSIS — R058 Other specified cough: Secondary | ICD-10-CM

## 2018-06-19 DIAGNOSIS — R05 Cough: Secondary | ICD-10-CM

## 2018-06-19 NOTE — Progress Notes (Addendum)
@Patient  ID: Erica [name], female    DOB: 08-24-1975, 43 y.o.   MRN: [id number]  Chief Complaint  Patient presents with  . Follow-up    Asthma     Referring provider: [name], MD  HPI: 62 yobf with asthma all her life much worse since around 07/2010 referred by Dr [name] to pulmonary clinic in Sept 2012 Hx of Breast Cancer 2/2010s/p chemo/mastectomy /XRT   TEST /Events PFT's 01/31/2011 FEV1 1.76 (60% ) ratio 68 and no better p B2, DLCO 70% -med review 09/25/2012 >Dulera drug assistance paperwork given - HFA 90% 11/20/2012 .  -Med calendar 12/07/12 , 04/06/2013 , 04/18/2014,, 08/30/2014 , 11/08/2014  - PFT's 01/12/2013 1.73 (65%) Ratio 73 and no better p B2, DLCO 86% - trial off singulair 02/08/13 >>>did not stop  - 04/05/2014 trial of dulera 100 instead of 200 >worse  -04/18/2014 increase Dulera 200  - 08/01/2014 p extensive coaching HFA effectiveness = 90% - 08/01/2014 Walked RA x 2laps @ 185 ft each stopped due to cough >sob/ no desat moderate pace  - spirometry 08/01/2014 FEV1 1.87 (70%) ratio 78 fef 25-75 53%  - spirometry 02/10/2015 flattening of top portion of f/v loop only  - 02/10/2015 Walked RA 2 laps @ 185 ft each stopped due to cough >sob/ slow pace  - NO 03/24/2015 = 16 - Methacholine challenge 04/04/2015 POS asthma = wheeze/sob but no coughing fits >added qvar 80 2bid 06/29/2015 >>> -flare sarting Aug 19 2015 p exp to storage shed  - 08/31/2015 After extensive coaching HFA effectiveness = 75% (Ti too short)  - Spirometry 08/31/2015 FEV1 0.75 (28%) Ratio 68  - FENO 11/17/2015 = 8 on symb 160 2bid and qvar 80 2bid  - FENO 03/22/2016 = 36 on symb 160/qvar 80 though not able to verify being used as directed  -MRI 05/18/15 >Paranasal sinuses are clear - 11/17/2015 rec increase h1 to 2 hs to help with noct cough  - Allergy profile 11/17/2015 >Eos 0.1 / IgE 80 POS RAST to grass, ragweed, cedar tree    06/19/2018 Follow up : Asthma , -  Telephone visit.  Patient for follow up visit for Asthma and Medication Review. Done through tele-visit due to minimize COVID 19 risk exposure per agency protocol .  Patient called at home with verbal consent for visit. Myself and Parke Poisson present for visit.  She was recently seen 2 weeks ago for asthma flare, treated with Z-Pack and prednisone taper.  She is feeling much improved with decreased cough and wheezing . Feeling much better.  She remains on Symbicort Twice daily  . No increased albuterol use.   She has severe difficult to control HTN , managed by cardiology . Says she has had recent blood pressure medication changes . Seems to be helping . No known issues.   She does have sleep apnea managed by Dr. Radford Pax. Is on CPAP At bedtime  With oxygen . Has trouble with CPAP , working on different options currently . No significant daytime sleepiness.   We reviewed all her medications with patient education and calendar was updated. Will mail medication calendar to patient at home .     Time visit : 22 min spent with medical discussion   Allergies  Allergen Reactions  . Aspirin Shortness Of Breath and Palpitations    Pt can take ibuprofen without reaction  . Doxycycline Anaphylaxis  . Kiwi Extract Itching and Swelling    Lip swelling  . Metoprolol Hives and Itching  .  Penicillins Shortness Of Breath and Palpitations    Has patient had a PCN reaction causing immediate rash, facial/tongue/throat swelling, SOB or lightheadedness with hypotension: Yes Has patient had a PCN reaction causing severe rash involving mucus membranes or skin necrosis: Yes Has patient had a PCN reaction that required hospitalization Yes Has patient had a PCN reaction occurring within the last 10 years: No If all of the above answers are "NO", then may proceed with Cephalosporin use.  . Strawberry Extract Hives  . Sulfamethoxazole-Trimethoprim Anaphylaxis    Facial, throat and tongue swelling with  difficulty swallowing.    Immunization History  Administered Date(s) Administered  . Influenza Split 12/31/2010, 03/02/2012, 12/01/2015  . Influenza,inj,Quad PF,6+ Mos 02/09/2013, 02/02/2014, 12/06/2014, 12/05/2015, 02/04/2017, 03/06/2018  . Pneumococcal Conjugate-13 12/01/2014  . Pneumococcal Polysaccharide-23 03/02/2012  . Tdap 05/12/2011    Past Medical History:  Diagnosis Date  . Asthma   . Breast cancer ([place]) 2010   a. locally advanced left breast carcinoma diagnosed in 2010 and treated with neoadjuvant chemotherapy with docetaxel and cyclophosphamide as well as paclitaxel. This was followed by radiation therapy which was completed in 2011.  Marland Kitchen Dyspnea on exertion    a. 01/2013 Lexi MV: EF 54%, no ischemia/infarct;  b. 05/2013 Echo: EF 60-65%, no rwma, Gr 2 DD;  b.   . Fatty liver   . Gastroparesis   . GERD (gastroesophageal reflux disease)   . Hypertension   . Impaired glucose tolerance 04/13/2011  . Lymphadenitis, chronic    restricted LEFT extremity  . 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 OSA (obstructive sleep apnea) 05/04/2014   AHI 31/hr now on CPAP at 12cm H2O  . Personal history of chemotherapy 2010  . Personal history of radiation therapy 2010    Tobacco History: Social History   Tobacco Use  Smoking Status Never Smoker  Smokeless Tobacco Never Used   Counseling given: Not Answered   Outpatient Medications Prior to Visit  Medication Sig Dispense Refill  . acetaminophen-codeine (TYLENOL #4) 300-60 MG tablet Take 1 tablet by mouth every 4 (four) hours as needed for moderate pain. 30 tablet 0  . acetaZOLAMIDE (DIAMOX) 250 MG tablet TAKE 4 TABLETS BY MOUTH 3 (THREE) TIMES DAILY. 1080 tablet 1  . albuterol (PROVENTIL) (2.5 MG/3ML) 0.083% nebulizer solution USE 1 VIAL VIA NEBULIZER EVERY 4 HOURS AND AS NEEDED FOR WHEEZING OR SHORTNESS OF BREATH 75 mL 1  . amitriptyline (ELAVIL) 100 MG tablet Take 1 tablet (100 mg total) by mouth at bedtime. 90 tablet 0  .  amLODipine (NORVASC) 10 MG tablet Take 1 tablet (10 mg total) by mouth daily. 90 tablet 0  . azithromycin (ZITHROMAX) 250 MG tablet Take 2 on day one then 1 daily x 4 days 6 tablet 0  . Cholecalciferol 50 MCG (2000 UT) TABS Take 2 tablets (4,000 Units total) by mouth daily. 180 tablet 1  . cloNIDine (CATAPRES - DOSED IN MG/24 HR) 0.3 mg/24hr patch Place 1 patch (0.3 mg total) onto the skin once a week. 12 patch 1  . clotrimazole (MYCELEX) 10 MG troche Take 1 tablet (10 mg total) by mouth 5 (five) times daily. 30 tablet 11  . DEXILANT 60 MG capsule TAKE 1 CAPSULE BY MOUTH EVERY DAY 90 capsule 3  . dextromethorphan-guaiFENesin (MUCINEX DM) 30-600 MG 12hr tablet Take 1 tablet by mouth 2 (two) times daily.     Marland Kitchen dicyclomine (BENTYL) 20 MG tablet TAKE 1 TABLET BY MOUTH 3 TIMES DAILY AS NEEDED FOR SPASMS 90  tablet 1  . diltiazem (CARDIZEM LA) 420 MG 24 hr tablet Take 1 tablet (420 mg total) by mouth daily. 90 tablet 0  . DULoxetine (CYMBALTA) 30 MG capsule Take 1 capsule (30 mg total) by mouth daily. With 60mg  for total of 90 mg 30 capsule 1  . DULoxetine (CYMBALTA) 60 MG capsule Take 1 capsule (60 mg total) by mouth daily. For total of 90 mg 30 capsule 1  . EPINEPHRINE 0.3 mg/0.3 mL IJ SOAJ injection INJECT 0.3 MLS INTO THE MUSCLE ONCE AS NEEDED FOR ANAPHYLAXIS 2 Device 2  . furosemide (LASIX) 40 MG tablet TAKE 1 TABLET BY MOUTH TWICE A DAY 180 tablet 0  . hydrALAZINE (APRESOLINE) 100 MG tablet TAKE 1 TABLET (100 MG TOTAL) BY MOUTH 3 (THREE) TIMES DAILY. 270 tablet 2  . lidocaine-prilocaine (EMLA) cream Apply 1 application topically. Apply 1 application to skin ever 28 days    . metoCLOPramide (REGLAN) 10 MG tablet TAKE 1 TABLET BY MOUTH 4 TIMES A DAY WITH MEALS AND AT BEDTIME 360 tablet 3  . montelukast (SINGULAIR) 10 MG tablet TAKE 1 TABLET BY MOUTH ONCE DAILY AT BEDTIME 90 tablet 1  . Nebivolol HCl 20 MG TABS Take 2 tablets (40 mg total) by mouth daily. 180 tablet 3  . NITROSTAT 0.4 MG SL tablet  PLACE 1 TABLET UNDER TONGUE EVERY 5 MINUTES AS NEEDED FOR CHEST PAIN, MAX OF 3 TABLETS THEN CALL 911 IF PAIN PERSISTS 100 tablet 0  . OXYGEN Use CPAP at bedtime    . oxymetazoline (MUCINEX NASAL SPRAY FULL FORCE) 0.05 % nasal spray Place 1 spray into both nostrils 2 (two) times daily.    . potassium chloride SA (K-DUR,KLOR-CON) 20 MEQ tablet Take 1 tablet (20 mEq total) by mouth 2 (two) times daily. 180 tablet 0  . predniSONE (DELTASONE) 10 MG tablet Take  4 each am x 2 days,   2 each am x 2 days,  1 each am x 2 days and stop 14 tablet 0  . primidone (MYSOLINE) 50 MG tablet TAKE 0.5 TABLETS (25 MG TOTAL) BY MOUTH AT BEDTIME. 45 tablet 1  . promethazine (PHENERGAN) 25 MG suppository Place 1 suppository (25 mg total) rectally every 6 (six) hours as needed for nausea or vomiting. 15 each 1  . promethazine (PHENERGAN) 25 MG tablet Take 1 tablet (25 mg total) by mouth 3 (three) times daily as needed for nausea or vomiting. 270 tablet 1  . ranolazine (RANEXA) 500 MG 12 hr tablet TAKE 1 TABLET BY MOUTH TWICE A DAY 180 tablet 2  . Respiratory Therapy Supplies (FLUTTER) DEVI Use as directed 1 each 0  . rosuvastatin (CRESTOR) 20 MG tablet Take 1 tablet (20 mg total) by mouth daily. 90 tablet 1  . Spacer/Aero-Holding Chambers (AEROCHAMBER MV) inhaler Use as instructed 1 each 0  . spironolactone (ALDACTONE) 100 MG tablet TAKE 1 TABLET BY MOUTH EVERY DAY 90 tablet 0  . Suvorexant (BELSOMRA) 15 MG TABS Take 1 tablet by mouth daily. 30 tablet 5  . SYMBICORT 160-4.5 MCG/ACT inhaler INHALE 2 PUFFS INTO LUNGS 2 TIMES DAILY AS DIRECTED 30.6 Inhaler 1  . traZODone (DESYREL) 150 MG tablet TAKE 1 TABLET BY MOUTH AT BEDTIME 90 tablet 1  . valsartan (DIOVAN) 320 MG tablet Take 1 tablet (320 mg total) by mouth daily. 90 tablet 1  . VENTOLIN HFA 108 (90 Base) MCG/ACT inhaler INHALE 2 PUFFS BY MOUTH EVERY 4 HOURS AS NEEDED FOR WHEEZE OR FOR SHORTNESS OF BREATH 18 Inhaler  1   Facility-Administered Medications Prior to Visit   Medication Dose Route Frequency Provider Last Rate Last Dose  . regadenoson (LEXISCAN) injection SOLN 0.4 mg  0.4 mg Intravenous Once Lelon Perla, MD      . technetium tetrofosmin (TC-MYOVIEW) injection 11.9 millicurie  41.7 millicurie Intravenous Once PRN Lelon Perla, MD      . technetium tetrofosmin (TC-MYOVIEW) injection 40.8 millicurie  14.4 millicurie Intravenous Once PRN Lelon Perla, MD         Review of Systems:   Constitutional:   No  weight loss, night sweats,  Fevers, chills, fatigue, or  lassitude.  HEENT:   No headaches,  Difficulty swallowing,  Tooth/dental problems, or  Sore throat,                No sneezing, itching, ear ache,  +nasal congestion, post nasal drip,   CV:  No chest pain,  Orthopnea, PND, swelling in lower extremities, anasarca, dizziness, palpitations, syncope.   GI  No heartburn, indigestion, abdominal pain, nausea, vomiting, diarrhea, change in bowel habits, loss of appetite, bloody stools.   Resp: + cough decreased wheezing  Skin: no rash or lesions.  GU: no dysuria, change in color of urine, no urgency or frequency.  No flank pain, no hematuria   MS:  No joint pain or swelling.  No decreased range of motion.  No back pain.    Physical Exam Not done due to phone visit.       Lab Results:  CBC    Component Value Date/Time   WBC 6.6 03/13/2018 1115   RBC 4.72 03/13/2018 1115   HGB 14.0 03/13/2018 1115   HGB 14.6 11/19/2017 1040   HGB 13.8 03/17/2017 0835   HCT 41.6 03/13/2018 1115   HCT 41.8 03/17/2017 0835   PLT 215.0 03/13/2018 1115   PLT 192 11/19/2017 1040   PLT 191 03/17/2017 0835   MCV 88.2 03/13/2018 1115   MCV 88.0 03/17/2017 0835   MCH 29.2 11/19/2017 1040   MCHC 33.6 03/13/2018 1115   RDW 14.9 03/13/2018 1115   RDW 14.5 03/17/2017 0835   LYMPHSABS 1.8 03/13/2018 1115   LYMPHSABS 1.6 03/17/2017 0835   MONOABS 0.4 03/13/2018 1115   MONOABS 0.3 03/17/2017 0835   EOSABS 0.1 03/13/2018 1115   EOSABS 0.1  03/17/2017 0835   BASOSABS 0.1 03/13/2018 1115   BASOSABS 0.1 03/17/2017 0835    BMET    Component Value Date/Time   NA 144 04/06/2018 1235   NA 142 03/17/2017 0835   K 3.8 04/06/2018 1235   K 3.6 03/17/2017 0835   CL 103 04/06/2018 1235   CL 103 09/10/2012 1618   CO2 23 04/06/2018 1235   CO2 27 03/17/2017 0835   GLUCOSE 99 04/06/2018 1235   GLUCOSE 88 03/30/2018 1604   GLUCOSE 114 03/17/2017 0835   GLUCOSE 86 09/10/2012 1618   BUN 12 04/06/2018 1235   BUN 10.1 03/17/2017 0835   CREATININE 0.98 04/06/2018 1235   CREATININE 1.03 (H) 11/19/2017 1040   CREATININE 0.9 03/17/2017 0835   CALCIUM 9.4 04/06/2018 1235   CALCIUM 9.5 03/17/2017 0835   GFRNONAA 70 04/06/2018 1235   GFRNONAA >60 11/19/2017 1040   GFRAA 81 04/06/2018 1235   GFRAA >60 11/19/2017 1040    BNP No results found for: BNP  ProBNP    Component Value Date/Time   PROBNP 290 (H) 04/04/2017 1637   PROBNP 16.0 08/01/2014 1120    Imaging: No results found.  goserelin (ZOLADEX) injection 3.6 mg    Date Action Dose Route User   05/06/2018 0956 Given 3.6 mg Subcutaneous (Right Lower Abdomen) Kasandra Knudsen A, LPN    goserelin (ZOLADEX) injection 3.6 mg    Date Action Dose Route User   06/03/2018 1020 Given 3.6 mg Subcutaneous (Right Lower Abdomen) Will Bonnet, LPN      PFT Results Latest Ref Rng & Units 04/04/2015  FVC-Pre L 2.24  FVC-Predicted Pre % 70  FVC-Post L 2.26  FVC-Predicted Post % 71  Pre FEV1/FVC % % 76  Post FEV1/FCV % % 78  FEV1-Pre L 1.71  FEV1-Predicted Pre % 65  FEV1-Post L 1.77    Lab Results  Component Value Date   NITRICOXIDE 8 11/17/2015        Assessment & Plan:   Asthma without status asthmaticus Recent flare now improved after recent URI  Continue on current medications   Patient's medications were reviewed today and patient education was given. Computerized medication calendar was adjusted/completed   Will mail AVS and updated medication calendar .     Apnea, sleep Cont follow up with cards   Upper airway cough syndrome Cont on cough control regimen      Rexene Edison, NP 06/19/2018

## 2018-06-19 NOTE — Assessment & Plan Note (Signed)
Cont follow up with cards

## 2018-06-19 NOTE — Patient Instructions (Addendum)
Follow med calendar closely and bring to each visit.  Brush/rinse and gargle after inhalers.  Follow up Dr. Melvyn Novas  In 6 weeks and As needed

## 2018-06-19 NOTE — Assessment & Plan Note (Signed)
Cont on cough control regimen

## 2018-06-19 NOTE — Addendum Note (Signed)
Addended by: Parke Poisson E on: 06/19/2018 11:44 AM   Modules accepted: Orders

## 2018-06-19 NOTE — Assessment & Plan Note (Signed)
Recent flare now improved after recent URI  Continue on current medications   Patient's medications were reviewed today and patient education was given. Computerized medication calendar was adjusted/completed   Will mail AVS and updated medication calendar .

## 2018-06-19 NOTE — Progress Notes (Signed)
Note reviewed/ agree with imp/ plan

## 2018-06-24 ENCOUNTER — Ambulatory Visit: Payer: Medicare HMO

## 2018-06-28 ENCOUNTER — Other Ambulatory Visit: Payer: Self-pay | Admitting: Internal Medicine

## 2018-06-29 ENCOUNTER — Ambulatory Visit (INDEPENDENT_AMBULATORY_CARE_PROVIDER_SITE_OTHER): Payer: Medicare HMO | Admitting: Internal Medicine

## 2018-06-29 ENCOUNTER — Encounter: Payer: Self-pay | Admitting: Internal Medicine

## 2018-06-29 DIAGNOSIS — G8929 Other chronic pain: Secondary | ICD-10-CM

## 2018-06-29 DIAGNOSIS — M79672 Pain in left foot: Secondary | ICD-10-CM | POA: Diagnosis not present

## 2018-06-29 DIAGNOSIS — I5032 Chronic diastolic (congestive) heart failure: Secondary | ICD-10-CM

## 2018-06-29 DIAGNOSIS — M25562 Pain in left knee: Secondary | ICD-10-CM | POA: Diagnosis not present

## 2018-06-29 DIAGNOSIS — I10 Essential (primary) hypertension: Secondary | ICD-10-CM | POA: Diagnosis not present

## 2018-06-29 HISTORY — DX: Other chronic pain: G89.29

## 2018-06-29 MED ORDER — TORSEMIDE 20 MG PO TABS: 20.0000 mg | ORAL_TABLET | Freq: Every day | ORAL | 1 refills | Status: DC

## 2018-06-29 NOTE — Progress Notes (Signed)
Patient ID: Erica [name], female   DOB: 29-Jul-1975, 43 y.o.   MRN: [id number] Virtual Visit via Video Note  I connected with [name] on 06/29/18 at  8:30 AM EDT by a video enabled telemedicine application and verified that I am speaking with the correct person using two identifiers.   I discussed the limitations of evaluation and management by telemedicine and the availability of in person appointments. The patient expressed understanding and agreed to proceed.  History of Present Illness: I conducted a virtual visit with the patient.  She was in her home and I was in my office.  She has monitored her blood pressure over the last week and and her systolics have been around 164, 188, and 400 and her diastolics have been around 111-1 14.  She tells me she is taking all of the  antihypertensive listed however she is only taking the furosemide as needed for lower extremity edema.  Her last episode of edema was about 2 days ago.  She also tells me that she thinks her blood pressure has recently been elevated because she has been taking prednisone and is been using albuterol inhaler.  She denies any recent episodes of headache, blurred vision, chest pain, or diaphoresis.  She also complains of a one-month history of left foot pain.  She describes this an area 2 years ago when she was seen in the ED and placed in a walking boot.  She denies any recent trauma or injury.  She tells me she is not taking anti-inflammatories.  She continues to complain of insomnia with frequent awakening and apnea.    Observations/Objective: She was alert, calm, and oriented during the visit.  Reported blood pressure is about 164/111.   Assessment and Plan: It sounds like her blood pressure is not adequately well controlled so I have asked her to take a loop diuretic every day but I think it would be better for her to take one that is effective with QD dosing so I have asked her to stop taking the as needed furosemide and to  change to a daily dose of torsemide to control the lower extremity edema and to lower her blood pressure.  I have also asked her to review her meds list and to make sure that she is taking all of the antihypertensives listed.  She agrees to avoid decongestants and nonsteroidal anti-inflammatories.  Additionally, she will come in in the next few weeks to have an x-ray done of her left foot to see if there is a fracture.   Follow Up Instructions: She agrees to come in in the next few weeks to have the x-ray of the foot done.  When the fear of the COVID-19 infection has passed she will come in to recheck her blood pressure in person and to monitor her electrolytes and renal function.  She agrees to avoid decongestants and anti-inflammatories.  She is also in the process of having her sleep apnea treated more effectively.    I discussed the assessment and treatment plan with the patient. The patient was provided an opportunity to ask questions and all were answered. The patient agreed with the plan and demonstrated an understanding of the instructions.   The patient was advised to call back or seek an in-person evaluation if the symptoms worsen or if the condition fails to improve as anticipated.  I provided 25 minutes of non-face-to-face time during this encounter.   Scarlette Calico, MD

## 2018-06-29 NOTE — Progress Notes (Deleted)
Subjective:  Patient ID: Erica [name], female    DOB: 27-Jul-1975  Age: 43 y.o. MRN: [id number]  CC: No chief complaint on file.   HPI Erica [name] presents for ***  Outpatient Medications Prior to Visit  Medication Sig Dispense Refill  . acetaminophen-codeine (TYLENOL #4) 300-60 MG tablet Take 1 tablet by mouth every 4 (four) hours as needed for moderate pain. 30 tablet 0  . acetaZOLAMIDE (DIAMOX) 250 MG tablet TAKE 4 TABLETS BY MOUTH 3 (THREE) TIMES DAILY. 1080 tablet 1  . albuterol (PROVENTIL) (2.5 MG/3ML) 0.083% nebulizer solution USE 1 VIAL VIA NEBULIZER EVERY 4 HOURS AND AS NEEDED FOR WHEEZING OR SHORTNESS OF BREATH 75 mL 1  . amitriptyline (ELAVIL) 100 MG tablet TAKE 1 TABLET BY MOUTH EVERYDAY AT BEDTIME 90 tablet 0  . amLODipine (NORVASC) 10 MG tablet Take 1 tablet (10 mg total) by mouth daily. 90 tablet 0  . Cholecalciferol 50 MCG (2000 UT) TABS Take 2 tablets (4,000 Units total) by mouth daily. 180 tablet 1  . cloNIDine (CATAPRES - DOSED IN MG/24 HR) 0.3 mg/24hr patch Place 1 patch (0.3 mg total) onto the skin once a week. 12 patch 1  . clotrimazole (MYCELEX) 10 MG troche Take 1 tablet (10 mg total) by mouth 5 (five) times daily. 30 tablet 11  . DEXILANT 60 MG capsule TAKE 1 CAPSULE BY MOUTH EVERY DAY 90 capsule 3  . dextromethorphan-guaiFENesin (MUCINEX DM) 30-600 MG 12hr tablet Take 1 tablet by mouth 2 (two) times daily.     Marland Kitchen dicyclomine (BENTYL) 20 MG tablet TAKE 1 TABLET BY MOUTH 3 TIMES DAILY AS NEEDED FOR SPASMS 90 tablet 1  . diltiazem (CARDIZEM LA) 420 MG 24 hr tablet Take 1 tablet (420 mg total) by mouth daily. 90 tablet 0  . DULoxetine (CYMBALTA) 30 MG capsule Take 1 capsule (30 mg total) by mouth daily. With 60mg  for total of 90 mg 30 capsule 1  . DULoxetine (CYMBALTA) 60 MG capsule Take 1 capsule (60 mg total) by mouth daily. For total of 90 mg 30 capsule 1  . EPINEPHRINE 0.3 mg/0.3 mL IJ SOAJ injection INJECT 0.3 MLS INTO THE MUSCLE ONCE AS NEEDED FOR ANAPHYLAXIS  2 Device 2  . furosemide (LASIX) 40 MG tablet TAKE 1 TABLET BY MOUTH TWICE A DAY 180 tablet 0  . hydrALAZINE (APRESOLINE) 100 MG tablet TAKE 1 TABLET (100 MG TOTAL) BY MOUTH 3 (THREE) TIMES DAILY. 270 tablet 2  . lidocaine-prilocaine (EMLA) cream Apply 1 application topically. Apply 1 application to skin ever 28 days    . metoCLOPramide (REGLAN) 10 MG tablet TAKE 1 TABLET BY MOUTH 4 TIMES A DAY WITH MEALS AND AT BEDTIME 360 tablet 3  . montelukast (SINGULAIR) 10 MG tablet TAKE 1 TABLET BY MOUTH ONCE DAILY AT BEDTIME 90 tablet 1  . Nebivolol HCl 20 MG TABS Take 2 tablets (40 mg total) by mouth daily. 180 tablet 3  . NITROSTAT 0.4 MG SL tablet PLACE 1 TABLET UNDER TONGUE EVERY 5 MINUTES AS NEEDED FOR CHEST PAIN, MAX OF 3 TABLETS THEN CALL 911 IF PAIN PERSISTS 100 tablet 0  . OXYGEN Use CPAP at bedtime    . potassium chloride SA (K-DUR,KLOR-CON) 20 MEQ tablet Take 1 tablet (20 mEq total) by mouth 2 (two) times daily. 180 tablet 0  . primidone (MYSOLINE) 50 MG tablet TAKE 0.5 TABLETS (25 MG TOTAL) BY MOUTH AT BEDTIME. 45 tablet 1  . promethazine (PHENERGAN) 25 MG suppository Place 1 suppository (25 mg  total) rectally every 6 (six) hours as needed for nausea or vomiting. 15 each 1  . promethazine (PHENERGAN) 25 MG tablet Take 1 tablet (25 mg total) by mouth 3 (three) times daily as needed for nausea or vomiting. 270 tablet 1  . ranolazine (RANEXA) 500 MG 12 hr tablet TAKE 1 TABLET BY MOUTH TWICE A DAY 180 tablet 2  . Respiratory Therapy Supplies (FLUTTER) DEVI Use as directed 1 each 0  . rosuvastatin (CRESTOR) 20 MG tablet Take 1 tablet (20 mg total) by mouth daily. 90 tablet 1  . Spacer/Aero-Holding Chambers (AEROCHAMBER MV) inhaler Use as instructed 1 each 0  . spironolactone (ALDACTONE) 100 MG tablet TAKE 1 TABLET BY MOUTH EVERY DAY 90 tablet 0  . Suvorexant (BELSOMRA) 15 MG TABS Take 1 tablet by mouth daily. 30 tablet 5  . SYMBICORT 160-4.5 MCG/ACT inhaler INHALE 2 PUFFS INTO LUNGS 2 TIMES DAILY AS  DIRECTED 30.6 Inhaler 1  . valsartan (DIOVAN) 320 MG tablet Take 1 tablet (320 mg total) by mouth daily. 90 tablet 1  . VENTOLIN HFA 108 (90 Base) MCG/ACT inhaler INHALE 2 PUFFS BY MOUTH EVERY 4 HOURS AS NEEDED FOR WHEEZE OR FOR SHORTNESS OF BREATH 18 Inhaler 1   Facility-Administered Medications Prior to Visit  Medication Dose Route Frequency Provider Last Rate Last Dose  . regadenoson (LEXISCAN) injection SOLN 0.4 mg  0.4 mg Intravenous Once Lelon Perla, MD      . technetium tetrofosmin (TC-MYOVIEW) injection 96.7 millicurie  89.3 millicurie Intravenous Once PRN Lelon Perla, MD      . technetium tetrofosmin (TC-MYOVIEW) injection 81.0 millicurie  17.5 millicurie Intravenous Once PRN Stanford Breed Denice Bors, MD        ROS Review of Systems  Objective:  There were no vitals taken for this visit.  BP Readings from Last 3 Encounters:  06/09/18 (!) 190/90  06/05/18 (!) 170/100  06/03/18 (!) 190/100    Wt Readings from Last 3 Encounters:  06/05/18 200 lb (90.7 kg)  06/01/18 200 lb (90.7 kg)  05/18/18 199 lb 3.2 oz (90.4 kg)    Physical Exam  Lab Results  Component Value Date   WBC 6.6 03/13/2018   HGB 14.0 03/13/2018   HCT 41.6 03/13/2018   PLT 215.0 03/13/2018   GLUCOSE 99 04/06/2018   CHOL 209 (H) 03/30/2018   TRIG 204.0 (H) 03/30/2018   HDL 38.90 (L) 03/30/2018   LDLDIRECT 152.0 03/30/2018   LDLCALC 114 (H) 10/15/2016   ALT 26 03/30/2018   AST 21 03/30/2018   NA 144 04/06/2018   K 3.8 04/06/2018   CL 103 04/06/2018   CREATININE 0.98 04/06/2018   BUN 12 04/06/2018   CO2 23 04/06/2018   TSH 1.53 03/30/2018   INR 1.1 (H) 06/03/2012   HGBA1C 5.8 03/30/2018    No results found.  Assessment & Plan:   There are no diagnoses linked to this encounter. I am having Erica [name] maintain her Flutter, OXYGEN, Nitrostat, dextromethorphan-guaiFENesin, dicyclomine, EPINEPHrine, albuterol, lidocaine-prilocaine, amLODipine, promethazine, metoCLOPramide, hydrALAZINE,  diltiazem, DULoxetine, Ventolin HFA, DULoxetine, primidone, spironolactone, furosemide, Symbicort, clotrimazole, AeroChamber MV, acetaZOLAMIDE, promethazine, Suvorexant, cloNIDine, Cholecalciferol, rosuvastatin, potassium chloride SA, valsartan, ranolazine, montelukast, Dexilant, acetaminophen-codeine, Nebivolol HCl, and amitriptyline.  No orders of the defined types were placed in this encounter.    Follow-up: No follow-ups on file.  Scarlette Calico, MD

## 2018-06-30 ENCOUNTER — Encounter: Payer: Self-pay | Admitting: *Deleted

## 2018-07-01 ENCOUNTER — Encounter: Payer: Self-pay | Admitting: Internal Medicine

## 2018-07-01 ENCOUNTER — Ambulatory Visit (INDEPENDENT_AMBULATORY_CARE_PROVIDER_SITE_OTHER)
Admission: RE | Admit: 2018-07-01 | Discharge: 2018-07-01 | Disposition: A | Discharge status: Home / Self Care | Payer: Medicare HMO | Source: Ambulatory Visit | Attending: Internal Medicine | Admitting: Internal Medicine

## 2018-07-01 ENCOUNTER — Other Ambulatory Visit: Payer: Self-pay

## 2018-07-01 ENCOUNTER — Inpatient Hospital Stay: Payer: Medicare HMO | Attending: Oncology

## 2018-07-01 VITALS — BP 211/111 | HR 88 | Temp 98.5°F | Resp 18

## 2018-07-01 DIAGNOSIS — M79672 Pain in left foot: Secondary | ICD-10-CM

## 2018-07-01 DIAGNOSIS — Z9221 Personal history of antineoplastic chemotherapy: Secondary | ICD-10-CM | POA: Diagnosis not present

## 2018-07-01 DIAGNOSIS — C50412 Malignant neoplasm of upper-outer quadrant of left female breast: Secondary | ICD-10-CM

## 2018-07-01 DIAGNOSIS — Z923 Personal history of irradiation: Secondary | ICD-10-CM | POA: Diagnosis not present

## 2018-07-01 DIAGNOSIS — N938 Other specified abnormal uterine and vaginal bleeding: Secondary | ICD-10-CM | POA: Insufficient documentation

## 2018-07-01 DIAGNOSIS — M25562 Pain in left knee: Secondary | ICD-10-CM

## 2018-07-01 DIAGNOSIS — Z853 Personal history of malignant neoplasm of breast: Secondary | ICD-10-CM | POA: Diagnosis not present

## 2018-07-01 DIAGNOSIS — Z5111 Encounter for antineoplastic chemotherapy: Secondary | ICD-10-CM | POA: Insufficient documentation

## 2018-07-01 DIAGNOSIS — G8929 Other chronic pain: Secondary | ICD-10-CM | POA: Diagnosis not present

## 2018-07-01 MED ORDER — GOSERELIN ACETATE 3.6 MG ~~LOC~~ IMPL
DRUG_IMPLANT | SUBCUTANEOUS | Status: AC
  Filled 2018-07-01: qty 3.6

## 2018-07-01 MED ORDER — GOSERELIN ACETATE 3.6 MG ~~LOC~~ IMPL
3.6000 mg | DRUG_IMPLANT | Freq: Once | SUBCUTANEOUS | Status: AC
  Administered 2018-07-01: 3.6 mg via SUBCUTANEOUS

## 2018-07-01 NOTE — Addendum Note (Signed)
Addended by: Aviva Signs M on: 07/01/2018 10:51 AM   Modules accepted: Orders

## 2018-07-02 ENCOUNTER — Telehealth (INDEPENDENT_AMBULATORY_CARE_PROVIDER_SITE_OTHER): Payer: Medicare HMO | Admitting: Neurology

## 2018-07-02 ENCOUNTER — Encounter: Payer: Self-pay | Admitting: *Deleted

## 2018-07-02 DIAGNOSIS — G250 Essential tremor: Secondary | ICD-10-CM

## 2018-07-02 DIAGNOSIS — T451X5A Adverse effect of antineoplastic and immunosuppressive drugs, initial encounter: Secondary | ICD-10-CM

## 2018-07-02 DIAGNOSIS — G932 Benign intracranial hypertension: Secondary | ICD-10-CM

## 2018-07-02 DIAGNOSIS — G620 Drug-induced polyneuropathy: Secondary | ICD-10-CM

## 2018-07-02 MED ORDER — PRIMIDONE 50 MG PO TABS: 25.0000 mg | ORAL_TABLET | Freq: Every day | ORAL | 1 refills | Status: DC

## 2018-07-02 NOTE — Progress Notes (Signed)
Virtual Visit via Video Note The purpose of this virtual visit is to provide medical care while limiting exposure to the novel coronavirus.    Consent was obtained for video visit:  Yes.   Answered questions that patient had about telehealth interaction:  Yes.   I discussed the limitations, risks, security and privacy concerns of performing an evaluation and management service by telemedicine. I also discussed with the patient that there may be a patient responsible charge related to this service. The patient expressed understanding and agreed to proceed.  Pt location: Home Physician Location: office Name of referring provider:  [name], MD I connected with Erica [name] at patients initiation/request on 07/02/2018 at  9:00 AM EDT by video enabled telemedicine application and verified that I am speaking with the correct person using two identifiers. 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  At her last visit, I recommended that she see pain management for neuropathic pain and she reports seeing Dr. Letta Pate.  She remains on Lyrica 200mg  TID and Cymbalta 90mg .  She was unable to take Nucynta due to her history of asthma.    Her tremors are well controlled on primidone 25mg  daily.  She was also recommended to follow-up with [facility] neurosurgery since she had VP shunt and adjustments to it may allow reduction in the dose of her diamox.  She reports being rescheduled multiple times and did not ultimately go for a follow-up.  It has been over 2 years since her last eye exam.  She does not have new visual complaints or headaches.  She had not needed to go to the ER for large volume tap.  She is complaining of achy left ankle  pain, radiating into the knee.  She saw her PCP and has XR yesterday of the foot and knee.    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  Social History   Tobacco Use  . Smoking status: Never Smoker  . Smokeless tobacco: Never Used  Substance Use Topics  . Alcohol use: Yes    Alcohol/week: 0.0 standard drinks    Comment: occasionally/socially   Review of Systems:  CONSTITUTIONAL: No fevers, chills, night sweats, or weight loss.  EYES: +visual changes or eye pain ENT: No hearing changes.  No history of nose bleeds.   RESPIRATORY: No cough, wheezing and shortness of breath.   CARDIOVASCULAR: Negative for chest pain, and palpitations.   GI: Negative for abdominal discomfort, blood in stools or black stools.  No recent change in bowel habits.   GU:  No history of incontinence.   MUSCLOSKELETAL: +history of joint pain or swelling.  No myalgias.   SKIN: Negative for lesions, rash, and itching.   ENDOCRINE: Negative for cold or heat intolerance, polydipsia or goiter.   PSYCH:  No depression or anxiety symptoms.   NEURO: As Above.    Observations/Objective:   She is awake, alert, and easily engages in conversation.  Appears well, in no acute distress.  Breathing is non-labored.  Extraocular muscles are intact.  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 No ptosis. Face is symmetric.  She is antigravity in all extremities Gait appears antalgic, unassisted.  Assessment and Plan:   1.  Painful chemotherapy-induced neuropathy, stable.   She takes Lyrica 200mg  TID, Cymbalta 90mg  daily, and amitriptyline 100mg  qhs.  These medications are written by her PCP and pain management.   Follow-up with pain management for refractory pain  2.  Benign essential tremors, improved on primidone 25mg  daily.    3.  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 If her shunt can be adjusted, she can me managed with less medication and diamox can be tapered  Until then, continue diamox 1g TID Additionally, she is prescribed topiramate 25mg  by [facility] I stressed the importance of follow-up with her ophthalmologist, neurosurgeon, and 1 neurologist to consolidate care for psuedotumor  4.  Left ankle and knee pain, being evaluated by her PCP.   Follow Up Instructions:   I discussed the assessment and treatment plan with the patient. The patient was provided an opportunity to ask questions and all were answered. The patient agreed with the plan and demonstrated an understanding of the instructions.   The patient was advised to call back or seek an in-person evaluation if the symptoms worsen or if the condition fails to improve as anticipated.   Total Time spent in visit with the patient was:  30 min, of which more than 50% of the time was spent in counseling and/or coordinating care.   Pt understands and agrees with the plan of care outlined.     Alda Berthold, DO

## 2018-07-06 ENCOUNTER — Other Ambulatory Visit: Payer: Self-pay | Admitting: Internal Medicine

## 2018-07-06 DIAGNOSIS — M79672 Pain in left foot: Principal | ICD-10-CM

## 2018-07-06 DIAGNOSIS — G8929 Other chronic pain: Secondary | ICD-10-CM

## 2018-07-07 ENCOUNTER — Other Ambulatory Visit: Payer: Self-pay | Admitting: Cardiology

## 2018-07-07 DIAGNOSIS — R072 Precordial pain: Secondary | ICD-10-CM

## 2018-07-07 DIAGNOSIS — I5032 Chronic diastolic (congestive) heart failure: Secondary | ICD-10-CM

## 2018-07-07 DIAGNOSIS — I119 Hypertensive heart disease without heart failure: Secondary | ICD-10-CM

## 2018-07-08 ENCOUNTER — Other Ambulatory Visit: Payer: Self-pay | Admitting: Internal Medicine

## 2018-07-08 ENCOUNTER — Telehealth: Payer: Self-pay | Admitting: Pharmacist

## 2018-07-08 DIAGNOSIS — E876 Hypokalemia: Secondary | ICD-10-CM

## 2018-07-08 NOTE — Telephone Encounter (Signed)
Attempted to call patient to set up or complete phone visit for HTN. Unable to leave message as call was cut off. No voicemail picked up. I will send patient message through my chart.

## 2018-07-14 ENCOUNTER — Encounter: Payer: Self-pay | Admitting: *Deleted

## 2018-07-15 ENCOUNTER — Encounter: Payer: Self-pay | Admitting: Family Medicine

## 2018-07-15 ENCOUNTER — Ambulatory Visit (INDEPENDENT_AMBULATORY_CARE_PROVIDER_SITE_OTHER): Payer: Medicare HMO | Admitting: Family Medicine

## 2018-07-15 DIAGNOSIS — M76822 Posterior tibial tendinitis, left leg: Secondary | ICD-10-CM

## 2018-07-15 HISTORY — DX: Posterior tibial tendinitis, left leg: M76.822

## 2018-07-15 MED ORDER — DICLOFENAC SODIUM 2 % TD SOLN: 2.0000 g | Freq: Two times a day (BID) | TRANSDERMAL | 3 refills | Status: DC

## 2018-07-15 NOTE — Assessment & Plan Note (Signed)
Posterior tibialis tendinitis.  Discussed icing regimen and home exercise.  Discussed which activities of doing which was to avoid.  Discussed heel lift, over-the-counter orthotics, topical anti-inflammatories prescribed.  Hopefully see patient in the office in 4 weeks.

## 2018-07-15 NOTE — Progress Notes (Signed)
[facility] [address], [place] [address] Phone: [phone] Subjective:    Virtual Visit via Video Note  I connected with Erica [name] on 07/15/18 at  8:30 AM EDT by a video enabled telemedicine application and verified that I am speaking with the correct person using two identifiers.   I discussed the limitations of evaluation and management by telemedicine and the availability of in person appointments. The patient expressed understanding and agreed to proceed.  Patient was in her house the residents and I was in the office setting we were the only people in the video visit   I discussed the assessment and treatment plan with the patient. The patient was provided an opportunity to ask questions and all were answered. The patient agreed with the plan and demonstrated an understanding of the instructions.   The patient was advised to call back or seek an in-person evaluation if the symptoms worsen or if the condition fails to improve as anticipated.  I provided 46 minutes of -face-to-face time during this encounter.   Lyndal Pulley, DO   I'm seeing this patient by the request  of:  [name], MD   CC: foot pain  LKT:[id number]  Erica [name] is a 43 y.o. female coming in with complaint of left foot pain.  Patient has had pain for quite some time.  Seems to be worsening recently.  Hurts with and without activity patient states that there has been intermittent swelling but has been on both ankles.  Patient denies still any numbness.  States that the pain can be severe enough that stops from activities.   Patient did have x-rays of the left foot done July 01, 2018.  These were independently visualized by me.  Patient does have a os peroneum and an office navicular but otherwise fairly unremarkable.  Past Medical History:  Diagnosis Date  . Asthma   . Breast cancer ([place]) 2010   a. locally advanced left breast carcinoma diagnosed in  2010 and treated with neoadjuvant chemotherapy with docetaxel and cyclophosphamide as well as paclitaxel. This was followed by radiation therapy which was completed in 2011.  Marland Kitchen Dyspnea on exertion    a. 01/2013 Lexi MV: EF 54%, no ischemia/infarct;  b. 05/2013 Echo: EF 60-65%, no rwma, Gr 2 DD;  b.   . Fatty liver   . Gastroparesis   . GERD (gastroesophageal reflux disease)   . Hypertension   . Impaired glucose tolerance 04/13/2011  . Lymphadenitis, chronic    restricted LEFT extremity  . 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[place]) 09/27/2010  . OSA (obstructive sleep apnea) 05/04/2014   AHI 31/hr now on CPAP at 12cm H2O  . Personal history of chemotherapy 2010  . Personal history of radiation therapy 2010   Past Surgical History:  Procedure Laterality Date  . BRAIN SURGERY     Shunt  . BREAST LUMPECTOMY Left 09/2008  . CARPAL TUNNEL RELEASE Left 2011  . LYMPH NODE DISSECTION Left 2010   breast; 2 wks after breast lumpectomy  . TENDON RELEASE Right 2012   "hand"  . TUBAL LIGATION  05/11/2011   Procedure: POST PARTUM TUBAL LIGATION;  Surgeon: Emeterio Reeve, MD;  Location: [facility];  Service: Gynecology;  Laterality: Bilateral;  Induced for HTN   Social History   Socioeconomic History  . Marital status: Single    Spouse name: Not on file  . Number of children: 3  . Years of education: Not  on file  . Highest education level: Not on file  Occupational History    Employer: UNEMPLOYED  Social Needs  . Financial resource strain: Not on file  . Food insecurity:    Worry: Not on file    Inability: Not on file  . Transportation needs:    Medical: Not on file    Non-medical: Not on file  Tobacco Use  . Smoking status: Never Smoker  . Smokeless tobacco: Never Used  Substance and Sexual Activity  . Alcohol use: Yes    Alcohol/week: 0.0 standard drinks    Comment: occasionally/socially  . Drug use: No  . Sexual activity: Yes    Birth control/protection: Surgical  Lifestyle  . Physical activity:     Days per week: Not on file    Minutes per session: Not on file  . Stress: Not on file  Relationships  . Social connections:    Talks on phone: Not on file    Gets together: Not on file    Attends religious service: Not on file    Active member of club or organization: Not on file    Attends meetings of clubs or organizations: Not on file    Relationship status: Not on file  Other Topics Concern  . Not on file  Social History Narrative   She lives with four children.   She is currently not working (disbaility pending).      Allergies  Allergen Reactions  . Aspirin Shortness Of Breath and Palpitations    Pt can take ibuprofen without reaction  . Doxycycline Anaphylaxis  . Kiwi Extract Itching and Swelling    Lip swelling  . Metoprolol Hives and Itching  . Penicillins Shortness Of Breath and Palpitations    Has patient had a PCN reaction causing immediate rash, facial/tongue/throat swelling, SOB or lightheadedness with hypotension: Yes Has patient had a PCN reaction causing severe rash involving mucus membranes or skin necrosis: Yes Has patient had a PCN reaction that required hospitalization Yes Has patient had a PCN reaction occurring within the last 10 years: No If all of the above answers are "NO", then may proceed with Cephalosporin use.  . Strawberry Extract Hives  . Sulfamethoxazole-Trimethoprim Anaphylaxis    Facial, throat and tongue swelling with difficulty swallowing.   Family History  Problem Relation Age of Onset  . Other Mother 20       breast calcifications treated with surgery, breast cancer pill, and radiation  . Fibroids Sister 64       s/p TAH-BSO  . Diabetes Maternal Grandmother   . Heart disease Maternal Grandmother   . Hypertension Maternal Grandmother   . Breast cancer Other        maternal great aunt (MGM's sister)  . Breast cancer Other        paternal great aunt dx middle ages; s/p mastectomy  . Cancer Neg Hx   . Alcohol abuse Neg Hx   . Early  death Neg Hx   . Hyperlipidemia Neg Hx   . Kidney disease Neg Hx   . Stroke Neg Hx   . Colon cancer Neg Hx      Current Outpatient Medications (Cardiovascular):  .  acetaZOLAMIDE (DIAMOX) 250 MG tablet, TAKE 4 TABLETS BY MOUTH 3 (THREE) TIMES DAILY. Marland Kitchen  amLODipine (NORVASC) 10 MG tablet, Take 1 tablet (10 mg total) by mouth daily. .  cloNIDine (CATAPRES - DOSED IN MG/24 HR) 0.3 mg/24hr patch, Place 1 patch (0.3 mg total) onto the skin  once a week. .  diltiazem (CARDIZEM LA) 420 MG 24 hr tablet, Take 1 tablet (420 mg total) by mouth daily. Marland Kitchen  EPINEPHRINE 0.3 mg/0.3 mL IJ SOAJ injection, INJECT 0.3 MLS INTO THE MUSCLE ONCE AS NEEDED FOR ANAPHYLAXIS .  furosemide (LASIX) 40 MG tablet, TAKE 1 TABLET BY MOUTH TWICE A DAY .  hydrALAZINE (APRESOLINE) 100 MG tablet, TAKE 1 TABLET (100 MG TOTAL) BY MOUTH 3 (THREE) TIMES DAILY. .  Nebivolol HCl 20 MG TABS, Take 2 tablets (40 mg total) by mouth daily. Marland Kitchen  NITROSTAT 0.4 MG SL tablet, PLACE 1 TABLET UNDER TONGUE EVERY 5 MINUTES AS NEEDED FOR CHEST PAIN, MAX OF 3 TABLETS THEN CALL 911 IF PAIN PERSISTS .  ranolazine (RANEXA) 500 MG 12 hr tablet, TAKE 1 TABLET BY MOUTH TWICE A DAY .  rosuvastatin (CRESTOR) 20 MG tablet, Take 1 tablet (20 mg total) by mouth daily. Marland Kitchen  spironolactone (ALDACTONE) 100 MG tablet, TAKE 1 TABLET BY MOUTH EVERY DAY .  torsemide (DEMADEX) 20 MG tablet, Take 1 tablet (20 mg total) by mouth daily. .  valsartan (DIOVAN) 320 MG tablet, Take 1 tablet (320 mg total) by mouth daily.  Current Outpatient Medications (Respiratory):  .  albuterol (PROVENTIL) (2.5 MG/3ML) 0.083% nebulizer solution, USE 1 VIAL VIA NEBULIZER EVERY 4 HOURS AND AS NEEDED FOR WHEEZING OR SHORTNESS OF BREATH .  dextromethorphan-guaiFENesin (MUCINEX DM) 30-600 MG 12hr tablet, Take 1 tablet by mouth 2 (two) times daily.  .  montelukast (SINGULAIR) 10 MG tablet, TAKE 1 TABLET BY MOUTH ONCE DAILY AT BEDTIME .  promethazine (PHENERGAN) 25 MG tablet, Take 1 tablet (25 mg  total) by mouth 3 (three) times daily as needed for nausea or vomiting. .  SYMBICORT 160-4.5 MCG/ACT inhaler, INHALE 2 PUFFS INTO LUNGS 2 TIMES DAILY AS DIRECTED .  VENTOLIN HFA 108 (90 Base) MCG/ACT inhaler, INHALE 2 PUFFS BY MOUTH EVERY 4 HOURS AS NEEDED FOR WHEEZE OR FOR SHORTNESS OF BREATH  Current Outpatient Medications (Analgesics):  .  acetaminophen-codeine (TYLENOL #4) 300-60 MG tablet, Take 1 tablet by mouth every 4 (four) hours as needed for moderate pain.   Current Outpatient Medications (Other):  .  amitriptyline (ELAVIL) 100 MG tablet, TAKE 1 TABLET BY MOUTH EVERYDAY AT BEDTIME .  Cholecalciferol 50 MCG (2000 UT) TABS, Take 2 tablets (4,000 Units total) by mouth daily. .  clotrimazole (MYCELEX) 10 MG troche, Take 1 tablet (10 mg total) by mouth 5 (five) times daily. Marland Kitchen  DEXILANT 60 MG capsule, TAKE 1 CAPSULE BY MOUTH EVERY DAY .  Diclofenac Sodium 2 % SOLN, Place 2 g onto the skin 2 (two) times daily. Marland Kitchen  dicyclomine (BENTYL) 20 MG tablet, TAKE 1 TABLET BY MOUTH 3 TIMES DAILY AS NEEDED FOR SPASMS .  DULoxetine (CYMBALTA) 30 MG capsule, Take 1 capsule (30 mg total) by mouth daily. With 60mg  for total of 90 mg .  DULoxetine (CYMBALTA) 60 MG capsule, Take 1 capsule (60 mg total) by mouth daily. For total of 90 mg .  lidocaine-prilocaine (EMLA) cream, Apply 1 application topically. Apply 1 application to skin ever 28 days .  OXYGEN, Use CPAP at bedtime .  Potassium Chloride ER 20 MEQ TBCR, TAKE 1 TABLET BY MOUTH TWICE A DAY .  primidone (MYSOLINE) 50 MG tablet, Take 0.5 tablets (25 mg total) by mouth at bedtime. Marland Kitchen  Respiratory Therapy Supplies (FLUTTER) DEVI, Use as directed .  Spacer/Aero-Holding Chambers (AEROCHAMBER MV) inhaler, Use as instructed .  Suvorexant (BELSOMRA) 15 MG TABS, Take  1 tablet by mouth daily.    Past medical history, social, surgical and family history all reviewed in electronic medical record.  No pertanent information unless stated regarding to the chief  complaint.   Review of Systems:  No headache, visual changes, nausea, vomiting, diarrhea, constipation, dizziness, abdominal pain, skin rash, fevers, chills, night sweats, weight loss, swollen lymph nodes, body aches, joint swelling, muscle aches, chest pain, shortness of breath, mood changes.   Objective     General: No apparent distress alert and oriented x3 mood and affect normal, dressed appropriately.  HEENT: Pupils equal, extraocular movements intact  Respiratory: Patient's speak in full sentences and does not appear short of breath  Patient does show left ankle.  Points and pain over the posterior tibialis tendon.  Patient does have pes planus.  When patient stands there is overpronation    Impression and Recommendations:     This case required medical decision making of moderate complexity. The above documentation has been reviewed and is accurate and complete Lyndal Pulley, DO       Note: This dictation was prepared with Dragon dictation along with smaller phrase technology. Any transcriptional errors that result from this process are unintentional.

## 2018-07-17 ENCOUNTER — Ambulatory Visit: Payer: Medicare HMO | Admitting: Neurology

## 2018-07-21 ENCOUNTER — Encounter (HOSPITAL_BASED_OUTPATIENT_CLINIC_OR_DEPARTMENT_OTHER): Payer: Medicare HMO

## 2018-07-27 ENCOUNTER — Ambulatory Visit: Payer: Medicare HMO | Admitting: Cardiology

## 2018-07-29 ENCOUNTER — Inpatient Hospital Stay: Payer: Medicare HMO

## 2018-07-29 ENCOUNTER — Other Ambulatory Visit: Payer: Self-pay

## 2018-07-29 VITALS — BP 220/115 | Temp 98.5°F | Resp 18

## 2018-07-29 DIAGNOSIS — C50412 Malignant neoplasm of upper-outer quadrant of left female breast: Secondary | ICD-10-CM

## 2018-07-29 DIAGNOSIS — Z923 Personal history of irradiation: Secondary | ICD-10-CM | POA: Diagnosis not present

## 2018-07-29 DIAGNOSIS — Z5111 Encounter for antineoplastic chemotherapy: Secondary | ICD-10-CM | POA: Diagnosis not present

## 2018-07-29 DIAGNOSIS — Z853 Personal history of malignant neoplasm of breast: Secondary | ICD-10-CM | POA: Diagnosis not present

## 2018-07-29 DIAGNOSIS — Z9221 Personal history of antineoplastic chemotherapy: Secondary | ICD-10-CM | POA: Diagnosis not present

## 2018-07-29 DIAGNOSIS — N938 Other specified abnormal uterine and vaginal bleeding: Secondary | ICD-10-CM | POA: Diagnosis not present

## 2018-07-29 MED ORDER — GOSERELIN ACETATE 3.6 MG ~~LOC~~ IMPL
3.6000 mg | DRUG_IMPLANT | SUBCUTANEOUS | Status: DC
  Administered 2018-07-29: 3.6 mg via SUBCUTANEOUS

## 2018-07-29 NOTE — Patient Instructions (Signed)
Goserelin injection What is this medicine? GOSERELIN (GOE se rel in) is similar to a hormone found in the body. It lowers the amount of sex hormones that the body makes. Men will have lower testosterone levels and women will have lower estrogen levels while taking this medicine. In men, this medicine is used to treat prostate cancer; the injection is either given once per month or once every 12 weeks. A once per month injection (only) is used to treat women with endometriosis, dysfunctional uterine bleeding, or advanced breast cancer. This medicine may be used for other purposes; ask your health care provider or pharmacist if you have questions. COMMON BRAND NAME(S): Zoladex What should I tell my health care provider before I take this medicine? They need to know if you have any of these conditions (some only apply to women): -diabetes -heart disease or previous heart attack -high blood pressure -high cholesterol -kidney disease -osteoporosis or low bone density -problems passing urine -spinal cord injury -stroke -tobacco smoker -an unusual or allergic reaction to goserelin, hormone therapy, other medicines, foods, dyes, or preservatives -pregnant or trying to get pregnant -breast-feeding How should I use this medicine? This medicine is for injection under the skin. It is given by a health care professional in a hospital or clinic setting. Men receive this injection once every 4 weeks or once every 12 weeks. Women will only receive the once every 4 weeks injection. Talk to your pediatrician regarding the use of this medicine in children. Special care may be needed. Overdosage: If you think you have taken too much of this medicine contact a poison control center or emergency room at once. NOTE: This medicine is only for you. Do not share this medicine with others. What if I miss a dose? It is important not to miss your dose. Call your doctor or health care professional if you are unable to  keep an appointment. What may interact with this medicine? -female hormones like estrogen -herbal or dietary supplements like black cohosh, chasteberry, or DHEA -female hormones like testosterone -prasterone This list may not describe all possible interactions. Give your health care provider a list of all the medicines, herbs, non-prescription drugs, or dietary supplements you use. Also tell them if you smoke, drink alcohol, or use illegal drugs. Some items may interact with your medicine. What should I watch for while using this medicine? Visit your doctor or health care professional for regular checks on your progress. Your symptoms may appear to get worse during the first weeks of this therapy. Tell your doctor or healthcare professional if your symptoms do not start to get better or if they get worse after this time. Your bones may get weaker if you take this medicine for a long time. If you smoke or frequently drink alcohol you may increase your risk of bone loss. A family history of osteoporosis, chronic use of drugs for seizures (convulsions), or corticosteroids can also increase your risk of bone loss. Talk to your doctor about how to keep your bones strong. This medicine should stop regular monthly menstration in women. Tell your doctor if you continue to menstrate. Women should not become pregnant while taking this medicine or for 12 weeks after stopping this medicine. Women should inform their doctor if they wish to become pregnant or think they might be pregnant. There is a potential for serious side effects to an unborn child. Talk to your health care professional or pharmacist for more information. Do not breast-feed an infant while taking   this medicine. Men should inform their doctors if they wish to father a child. This medicine may lower sperm counts. Talk to your health care professional or pharmacist for more information. What side effects may I notice from receiving this  medicine? Side effects that you should report to your doctor or health care professional as soon as possible: -allergic reactions like skin rash, itching or hives, swelling of the face, lips, or tongue -bone pain -breathing problems -changes in vision -chest pain -feeling faint or lightheaded, falls -fever, chills -pain, swelling, warmth in the leg -pain, tingling, numbness in the hands or feet -signs and symptoms of low blood pressure like dizziness; feeling faint or lightheaded, falls; unusually weak or tired -stomach pain -swelling of the ankles, feet, hands -trouble passing urine or change in the amount of urine -unusually high or low blood pressure -unusually weak or tired Side effects that usually do not require medical attention (report to your doctor or health care professional if they continue or are bothersome): -change in sex drive or performance -changes in breast size in both males and females -changes in emotions or moods -headache -hot flashes -irritation at site where injected -loss of appetite -skin problems like acne, dry skin -vaginal dryness This list may not describe all possible side effects. Call your doctor for medical advice about side effects. You may report side effects to FDA at 1-800-FDA-1088. Where should I keep my medicine? This drug is given in a hospital or clinic and will not be stored at home. NOTE: This sheet is a summary. It may not cover all possible information. If you have questions about this medicine, talk to your doctor, pharmacist, or health care provider.  2019 Elsevier/Gold Standard (2013-05-25 11:10:35)  

## 2018-07-30 ENCOUNTER — Encounter

## 2018-07-31 ENCOUNTER — Other Ambulatory Visit: Payer: Self-pay | Admitting: Internal Medicine

## 2018-08-04 ENCOUNTER — Encounter: Payer: Self-pay | Admitting: Internal Medicine

## 2018-08-10 ENCOUNTER — Encounter: Payer: Self-pay | Admitting: Family Medicine

## 2018-08-14 ENCOUNTER — Other Ambulatory Visit: Payer: Self-pay | Admitting: Internal Medicine

## 2018-08-26 ENCOUNTER — Inpatient Hospital Stay: Payer: Medicare HMO | Attending: Oncology

## 2018-08-26 ENCOUNTER — Other Ambulatory Visit: Payer: Self-pay

## 2018-08-26 DIAGNOSIS — Z5111 Encounter for antineoplastic chemotherapy: Secondary | ICD-10-CM | POA: Insufficient documentation

## 2018-08-26 DIAGNOSIS — C50412 Malignant neoplasm of upper-outer quadrant of left female breast: Secondary | ICD-10-CM | POA: Diagnosis not present

## 2018-08-26 DIAGNOSIS — N938 Other specified abnormal uterine and vaginal bleeding: Secondary | ICD-10-CM | POA: Diagnosis not present

## 2018-08-26 MED ORDER — GOSERELIN ACETATE 3.6 MG ~~LOC~~ IMPL
3.6000 mg | DRUG_IMPLANT | Freq: Once | SUBCUTANEOUS | Status: AC
  Administered 2018-08-26: 10:00:00 3.6 mg via SUBCUTANEOUS
  Filled 2018-08-26: qty 3.6

## 2018-08-27 DIAGNOSIS — R7303 Prediabetes: Secondary | ICD-10-CM | POA: Diagnosis not present

## 2018-08-27 DIAGNOSIS — R82998 Other abnormal findings in urine: Secondary | ICD-10-CM | POA: Diagnosis not present

## 2018-08-27 DIAGNOSIS — G932 Benign intracranial hypertension: Secondary | ICD-10-CM | POA: Diagnosis not present

## 2018-08-27 DIAGNOSIS — N281 Cyst of kidney, acquired: Secondary | ICD-10-CM | POA: Diagnosis not present

## 2018-08-27 DIAGNOSIS — I10 Essential (primary) hypertension: Secondary | ICD-10-CM | POA: Diagnosis not present

## 2018-08-27 DIAGNOSIS — R801 Persistent proteinuria, unspecified: Secondary | ICD-10-CM | POA: Diagnosis not present

## 2018-09-11 ENCOUNTER — Other Ambulatory Visit: Payer: Self-pay | Admitting: Cardiology

## 2018-09-11 ENCOUNTER — Other Ambulatory Visit: Payer: Self-pay | Admitting: Neurology

## 2018-09-11 DIAGNOSIS — R072 Precordial pain: Secondary | ICD-10-CM

## 2018-09-11 DIAGNOSIS — I119 Hypertensive heart disease without heart failure: Secondary | ICD-10-CM

## 2018-09-19 ENCOUNTER — Other Ambulatory Visit: Payer: Self-pay | Admitting: Internal Medicine

## 2018-09-19 DIAGNOSIS — G932 Benign intracranial hypertension: Secondary | ICD-10-CM

## 2018-09-19 DIAGNOSIS — I10 Essential (primary) hypertension: Secondary | ICD-10-CM

## 2018-09-23 ENCOUNTER — Ambulatory Visit: Payer: Medicare Other

## 2018-09-23 ENCOUNTER — Inpatient Hospital Stay: Payer: Medicare HMO | Attending: Oncology

## 2018-09-23 ENCOUNTER — Other Ambulatory Visit: Payer: Self-pay

## 2018-09-23 ENCOUNTER — Encounter: Payer: Self-pay | Admitting: Internal Medicine

## 2018-09-23 VITALS — BP 190/100 | HR 104 | Temp 98.2°F | Resp 18

## 2018-09-23 DIAGNOSIS — Z5111 Encounter for antineoplastic chemotherapy: Secondary | ICD-10-CM | POA: Insufficient documentation

## 2018-09-23 DIAGNOSIS — C50412 Malignant neoplasm of upper-outer quadrant of left female breast: Secondary | ICD-10-CM | POA: Diagnosis not present

## 2018-09-23 MED ORDER — GOSERELIN ACETATE 3.6 MG ~~LOC~~ IMPL
3.6000 mg | DRUG_IMPLANT | SUBCUTANEOUS | Status: DC
  Administered 2018-09-23: 3.6 mg via SUBCUTANEOUS

## 2018-09-23 MED ORDER — GOSERELIN ACETATE 3.6 MG ~~LOC~~ IMPL
DRUG_IMPLANT | SUBCUTANEOUS | Status: AC
  Filled 2018-09-23: qty 3.6

## 2018-09-23 NOTE — Patient Instructions (Signed)
Goserelin injection What is this medicine? GOSERELIN (GOE se rel in) is similar to a hormone found in the body. It lowers the amount of sex hormones that the body makes. Men will have lower testosterone levels and women will have lower estrogen levels while taking this medicine. In men, this medicine is used to treat prostate cancer; the injection is either given once per month or once every 12 weeks. A once per month injection (only) is used to treat women with endometriosis, dysfunctional uterine bleeding, or advanced breast cancer. This medicine may be used for other purposes; ask your health care provider or pharmacist if you have questions. COMMON BRAND NAME(S): Zoladex What should I tell my health care provider before I take this medicine? They need to know if you have any of these conditions (some only apply to women): -diabetes -heart disease or previous heart attack -high blood pressure -high cholesterol -kidney disease -osteoporosis or low bone density -problems passing urine -spinal cord injury -stroke -tobacco smoker -an unusual or allergic reaction to goserelin, hormone therapy, other medicines, foods, dyes, or preservatives -pregnant or trying to get pregnant -breast-feeding How should I use this medicine? This medicine is for injection under the skin. It is given by a health care professional in a hospital or clinic setting. Men receive this injection once every 4 weeks or once every 12 weeks. Women will only receive the once every 4 weeks injection. Talk to your pediatrician regarding the use of this medicine in children. Special care may be needed. Overdosage: If you think you have taken too much of this medicine contact a poison control center or emergency room at once. NOTE: This medicine is only for you. Do not share this medicine with others. What if I miss a dose? It is important not to miss your dose. Call your doctor or health care professional if you are unable to  keep an appointment. What may interact with this medicine? -female hormones like estrogen -herbal or dietary supplements like black cohosh, chasteberry, or DHEA -female hormones like testosterone -prasterone This list may not describe all possible interactions. Give your health care provider a list of all the medicines, herbs, non-prescription drugs, or dietary supplements you use. Also tell them if you smoke, drink alcohol, or use illegal drugs. Some items may interact with your medicine. What should I watch for while using this medicine? Visit your doctor or health care professional for regular checks on your progress. Your symptoms may appear to get worse during the first weeks of this therapy. Tell your doctor or healthcare professional if your symptoms do not start to get better or if they get worse after this time. Your bones may get weaker if you take this medicine for a long time. If you smoke or frequently drink alcohol you may increase your risk of bone loss. A family history of osteoporosis, chronic use of drugs for seizures (convulsions), or corticosteroids can also increase your risk of bone loss. Talk to your doctor about how to keep your bones strong. This medicine should stop regular monthly menstration in women. Tell your doctor if you continue to menstrate. Women should not become pregnant while taking this medicine or for 12 weeks after stopping this medicine. Women should inform their doctor if they wish to become pregnant or think they might be pregnant. There is a potential for serious side effects to an unborn child. Talk to your health care professional or pharmacist for more information. Do not breast-feed an infant while taking   this medicine. Men should inform their doctors if they wish to father a child. This medicine may lower sperm counts. Talk to your health care professional or pharmacist for more information. What side effects may I notice from receiving this  medicine? Side effects that you should report to your doctor or health care professional as soon as possible: -allergic reactions like skin rash, itching or hives, swelling of the face, lips, or tongue -bone pain -breathing problems -changes in vision -chest pain -feeling faint or lightheaded, falls -fever, chills -pain, swelling, warmth in the leg -pain, tingling, numbness in the hands or feet -signs and symptoms of low blood pressure like dizziness; feeling faint or lightheaded, falls; unusually weak or tired -stomach pain -swelling of the ankles, feet, hands -trouble passing urine or change in the amount of urine -unusually high or low blood pressure -unusually weak or tired Side effects that usually do not require medical attention (report to your doctor or health care professional if they continue or are bothersome): -change in sex drive or performance -changes in breast size in both males and females -changes in emotions or moods -headache -hot flashes -irritation at site where injected -loss of appetite -skin problems like acne, dry skin -vaginal dryness This list may not describe all possible side effects. Call your doctor for medical advice about side effects. You may report side effects to FDA at 1-800-FDA-1088. Where should I keep my medicine? This drug is given in a hospital or clinic and will not be stored at home. NOTE: This sheet is a summary. It may not cover all possible information. If you have questions about this medicine, talk to your doctor, pharmacist, or health care provider.  2019 Elsevier/Gold Standard (2013-05-25 11:10:35)  

## 2018-09-27 ENCOUNTER — Other Ambulatory Visit: Payer: Self-pay | Admitting: Internal Medicine

## 2018-09-29 ENCOUNTER — Encounter: Payer: Self-pay | Admitting: Internal Medicine

## 2018-09-29 DIAGNOSIS — D49511 Neoplasm of unspecified behavior of right kidney: Secondary | ICD-10-CM | POA: Diagnosis not present

## 2018-10-01 ENCOUNTER — Encounter: Payer: Self-pay | Admitting: Internal Medicine

## 2018-10-01 ENCOUNTER — Other Ambulatory Visit (INDEPENDENT_AMBULATORY_CARE_PROVIDER_SITE_OTHER): Payer: Medicare HMO

## 2018-10-01 ENCOUNTER — Ambulatory Visit (INDEPENDENT_AMBULATORY_CARE_PROVIDER_SITE_OTHER): Payer: Medicare HMO | Admitting: Internal Medicine

## 2018-10-01 ENCOUNTER — Other Ambulatory Visit: Payer: Self-pay

## 2018-10-01 VITALS — BP 180/102 | HR 98 | Temp 98.5°F | Resp 16 | Ht 66.0 in | Wt 200.8 lb

## 2018-10-01 DIAGNOSIS — I10 Essential (primary) hypertension: Secondary | ICD-10-CM | POA: Diagnosis not present

## 2018-10-01 DIAGNOSIS — E559 Vitamin D deficiency, unspecified: Secondary | ICD-10-CM

## 2018-10-01 DIAGNOSIS — E785 Hyperlipidemia, unspecified: Secondary | ICD-10-CM

## 2018-10-01 DIAGNOSIS — I5032 Chronic diastolic (congestive) heart failure: Secondary | ICD-10-CM | POA: Diagnosis not present

## 2018-10-01 DIAGNOSIS — R801 Persistent proteinuria, unspecified: Secondary | ICD-10-CM | POA: Diagnosis not present

## 2018-10-01 DIAGNOSIS — G4733 Obstructive sleep apnea (adult) (pediatric): Secondary | ICD-10-CM | POA: Diagnosis not present

## 2018-10-01 LAB — BASIC METABOLIC PANEL
BUN: 16 mg/dL (ref 6–23)
CO2: 28 mEq/L (ref 19–32)
Calcium: 9.4 mg/dL (ref 8.4–10.5)
Chloride: 103 mEq/L (ref 96–112)
Creatinine, Ser: 0.98 mg/dL (ref 0.40–1.20)
GFR: 74.25 mL/min (ref >60.00)
Glucose, Bld: 93 mg/dL (ref 70–99)
Potassium: 3.4 mEq/L — ABNORMAL LOW (ref 3.5–5.1)
Sodium: 140 mEq/L (ref 135–145)

## 2018-10-01 LAB — CBC WITH DIFFERENTIAL/PLATELET
Basophils Absolute: 0.1 10*3/uL (ref 0.0–0.1)
Basophils Relative: 1 % (ref 0.0–3.0)
Eosinophils Absolute: 0.1 10*3/uL (ref 0.0–0.7)
Eosinophils Relative: 1.4 % (ref 0.0–5.0)
HCT: 42.4 % (ref 36.0–46.0)
Hemoglobin: 14 g/dL (ref 12.0–15.0)
Lymphocytes Relative: 46 % (ref 12.0–46.0)
Lymphs Abs: 3 10*3/uL (ref 0.7–4.0)
MCHC: 32.9 g/dL (ref 30.0–36.0)
MCV: 88.7 fl (ref 78.0–100.0)
Monocytes Absolute: 0.4 10*3/uL (ref 0.1–1.0)
Monocytes Relative: 6.5 % (ref 3.0–12.0)
Neutro Abs: 3 10*3/uL (ref 1.4–7.7)
Neutrophils Relative %: 45.1 % (ref 43.0–77.0)
Platelets: 222 10*3/uL (ref 150.0–400.0)
RBC: 4.78 Mil/uL (ref 3.87–5.11)
RDW: 14.5 % (ref 11.5–15.5)
WBC: 6.6 10*3/uL (ref 4.0–10.5)

## 2018-10-01 LAB — LIPID PANEL
Cholesterol: 198 mg/dL (ref 0–200)
HDL: 45.5 mg/dL (ref >39.00)
LDL Cholesterol: 130 mg/dL — ABNORMAL HIGH (ref 0–99)
NonHDL: 152.56
Total CHOL/HDL Ratio: 4
Triglycerides: 113 mg/dL (ref 0.0–149.0)
VLDL: 22.6 mg/dL (ref 0.0–40.0)

## 2018-10-01 LAB — VITAMIN D 25 HYDROXY (VIT D DEFICIENCY, FRACTURES): VITD: 11.21 ng/mL — ABNORMAL LOW (ref 30.00–100.00)

## 2018-10-01 MED ORDER — CHOLECALCIFEROL 1.25 MG (50000 UT) PO CAPS: 50000.0000 [IU] | ORAL_CAPSULE | ORAL | 1 refills | Status: DC

## 2018-10-01 MED ORDER — ROSUVASTATIN CALCIUM 20 MG PO TABS: 20.0000 mg | ORAL_TABLET | Freq: Every day | ORAL | 1 refills | Status: DC

## 2018-10-01 NOTE — Progress Notes (Signed)
  6 Minute Walk test   1 minute - 95 % and 100 bpm  2 minute - 93% and 110 bpm  3 minute - 92 % and 117 bpm  4 minute - 93% and 120 bpm  5 minute - 96% and 128 bpm  6 minute - 96% and 142 bpm

## 2018-10-01 NOTE — Patient Instructions (Signed)

## 2018-10-01 NOTE — Progress Notes (Signed)
Subjective:  Patient ID: Erica [name], female    DOB: [birth year]  Age: 43 y.o. MRN: [id number]  CC: Hypertension and Hyperlipidemia   HPI Erica [name] presents for f/up - She came in the today at the direction of a cardiologist.  She was told that she may need to start oxygen therapy.  She complains of shortness of breath and DOE.  She was due for a CPAP test about 3 or 4 months ago but it was canceled because of the COVID-19 pandemic.  She has not had oximetry done during her sleep.  She tells me she is compliant with all of her meds but her eye contact is poor and her body language leads me to believe that she is not taking all of her antihypertensives.  She also complains of chronic, unchanged nonproductive cough.  Outpatient Medications Prior to Visit  Medication Sig Dispense Refill  . acetaZOLAMIDE (DIAMOX) 250 MG tablet TAKE 4 TABLETS BY MOUTH 3 (THREE) TIMES DAILY. 1080 tablet 1  . albuterol (PROVENTIL) (2.5 MG/3ML) 0.083% nebulizer solution USE 1 VIAL VIA NEBULIZER EVERY 4 HOURS AND AS NEEDED FOR WHEEZING OR SHORTNESS OF BREATH 75 mL 1  . albuterol (VENTOLIN HFA) 108 (90 Base) MCG/ACT inhaler INHALE 2 PUFFS BY MOUTH EVERY 4 HOURS AS NEEDED FOR WHEEZE OR FOR SHORTNESS OF BREATH 18 Inhaler 1  . amitriptyline (ELAVIL) 100 MG tablet TAKE 1 TABLET BY MOUTH EVERYDAY AT BEDTIME 90 tablet 0  . amLODipine (NORVASC) 10 MG tablet Take 1 tablet (10 mg total) by mouth daily. 90 tablet 0  . cloNIDine (CATAPRES - DOSED IN MG/24 HR) 0.3 mg/24hr patch PLACE 1 PATCH ONTO SKIN ONCE WEEKLY 12 patch 1  . clotrimazole (MYCELEX) 10 MG troche Take 1 tablet (10 mg total) by mouth 5 (five) times daily. 30 tablet 11  . DEXILANT 60 MG capsule TAKE 1 CAPSULE BY MOUTH EVERY DAY 90 capsule 3  . dextromethorphan-guaiFENesin (MUCINEX DM) 30-600 MG 12hr tablet Take 1 tablet by mouth 2 (two) times daily.     . Diclofenac Sodium 2 % SOLN Place 2 g onto the skin 2 (two) times daily. 112 g 3  . dicyclomine (BENTYL) 20  MG tablet TAKE 1 TABLET BY MOUTH 3 TIMES DAILY AS NEEDED FOR SPASMS 90 tablet 1  . diltiazem (CARDIZEM LA) 420 MG 24 hr tablet Take 1 tablet (420 mg total) by mouth daily. 90 tablet 0  . DULoxetine (CYMBALTA) 30 MG capsule Take 1 capsule (30 mg total) by mouth daily. With 60mg  for total of 90 mg 30 capsule 1  . DULoxetine (CYMBALTA) 60 MG capsule Take 1 capsule (60 mg total) by mouth daily. For total of 90 mg 30 capsule 1  . EPINEPHRINE 0.3 mg/0.3 mL IJ SOAJ injection INJECT 0.3 MLS INTO THE MUSCLE ONCE AS NEEDED FOR ANAPHYLAXIS 2 Device 2  . furosemide (LASIX) 40 MG tablet TAKE 1 TABLET BY MOUTH TWICE A DAY 180 tablet 0  . hydrALAZINE (APRESOLINE) 100 MG tablet TAKE 1 TABLET (100 MG TOTAL) BY MOUTH 3 (THREE) TIMES DAILY. 270 tablet 1  . lidocaine-prilocaine (EMLA) cream Apply 1 application topically. Apply 1 application to skin ever 28 days    . montelukast (SINGULAIR) 10 MG tablet TAKE 1 TABLET BY MOUTH ONCE DAILY AT BEDTIME 90 tablet 1  . Nebivolol HCl 20 MG TABS Take 2 tablets (40 mg total) by mouth daily. 180 tablet 3  . NITROSTAT 0.4 MG SL tablet PLACE 1 TABLET UNDER TONGUE EVERY 5  MINUTES AS NEEDED FOR CHEST PAIN, MAX OF 3 TABLETS THEN CALL 911 IF PAIN PERSISTS 100 tablet 0  . OXYGEN Use CPAP at bedtime    . Potassium Chloride ER 20 MEQ TBCR TAKE 1 TABLET BY MOUTH TWICE A DAY 180 tablet 0  . primidone (MYSOLINE) 50 MG tablet Take 0.5 tablets (25 mg total) by mouth at bedtime. 45 tablet 1  . ranolazine (RANEXA) 500 MG 12 hr tablet TAKE 1 TABLET BY MOUTH TWICE A DAY 180 tablet 2  . Respiratory Therapy Supplies (FLUTTER) DEVI Use as directed 1 each 0  . Spacer/Aero-Holding Chambers (AEROCHAMBER MV) inhaler Use as instructed 1 each 0  . spironolactone (ALDACTONE) 100 MG tablet TAKE 1 TABLET BY MOUTH EVERY DAY 90 tablet 2  . Suvorexant (BELSOMRA) 15 MG TABS Take 1 tablet by mouth daily. 30 tablet 5  . SYMBICORT 160-4.5 MCG/ACT inhaler INHALE 2 PUFFS INTO LUNGS 2 TIMES DAILY AS DIRECTED 30.6  Inhaler 1  . torsemide (DEMADEX) 20 MG tablet Take 1 tablet (20 mg total) by mouth daily. 90 tablet 1  . valsartan (DIOVAN) 320 MG tablet Take 1 tablet (320 mg total) by mouth daily. 90 tablet 1  . acetaminophen-codeine (TYLENOL #4) 300-60 MG tablet TAKE 1 TABLET BY MOUTH EVERY 4 (FOUR) HOURS AS NEEDED FOR MODERATE PAIN 30 tablet 0  . Cholecalciferol 50 MCG (2000 UT) TABS Take 2 tablets (4,000 Units total) by mouth daily. 180 tablet 1  . promethazine (PHENERGAN) 25 MG tablet Take 1 tablet (25 mg total) by mouth 3 (three) times daily as needed for nausea or vomiting. 270 tablet 1  . rosuvastatin (CRESTOR) 20 MG tablet Take 1 tablet (20 mg total) by mouth daily. 90 tablet 1   No facility-administered medications prior to visit.     ROS Review of Systems  Constitutional: Negative for appetite change, chills, diaphoresis, fatigue, fever and unexpected weight change.  HENT: Negative.   Eyes: Negative for visual disturbance.  Respiratory: Positive for apnea, cough and shortness of breath. Negative for chest tightness and wheezing.   Cardiovascular: Negative for chest pain, palpitations and leg swelling.  Gastrointestinal: Negative for abdominal pain, constipation, diarrhea, nausea and vomiting.  Endocrine: Negative.   Genitourinary: Negative.  Negative for difficulty urinating.  Musculoskeletal: Negative for arthralgias and myalgias.  Skin: Negative.  Negative for color change and pallor.  Neurological: Negative.  Negative for dizziness, weakness, light-headedness and headaches.  Hematological: Negative for adenopathy. Does not bruise/bleed easily.  Psychiatric/Behavioral: Negative.     Objective:  BP (!) 180/102 (BP Location: Left Arm, Patient Position: Sitting, Cuff Size: Large)   Pulse 98   Temp 98.5 F (36.9 C) (Oral)   Resp 16   Ht 5\' 6"  (1.676 m)   Wt 200 lb 12 oz (91.1 kg)   SpO2 96%   BMI 32.40 kg/m   BP Readings from Last 3 Encounters:  10/01/18 (!) 180/102  09/23/18 (!)  190/100  07/29/18 (!) 220/115    Wt Readings from Last 3 Encounters:  10/01/18 200 lb 12 oz (91.1 kg)  07/02/18 200 lb (90.7 kg)  06/05/18 200 lb (90.7 kg)    Physical Exam Vitals signs reviewed.  Constitutional:      Appearance: She is obese. She is not ill-appearing or diaphoretic.  HENT:     Nose: Nose normal.     Mouth/Throat:     Mouth: Mucous membranes are moist.     Pharynx: No oropharyngeal exudate or posterior oropharyngeal erythema.  Eyes:  General: No scleral icterus.    Conjunctiva/sclera: Conjunctivae normal.  Neck:     Musculoskeletal: Normal range of motion. No neck rigidity or muscular tenderness.  Cardiovascular:     Rate and Rhythm: Normal rate and regular rhythm.     Heart sounds: No murmur. No gallop.   Pulmonary:     Effort: Pulmonary effort is normal.     Breath sounds: No stridor. No wheezing, rhonchi or rales.  Abdominal:     General: Abdomen is protuberant. Bowel sounds are normal. There is no distension.     Palpations: There is no hepatomegaly or splenomegaly.     Tenderness: There is no abdominal tenderness.     Hernia: No hernia is present.  Musculoskeletal: Normal range of motion.        General: No swelling.     Right lower leg: No edema.     Left lower leg: No edema.  Lymphadenopathy:     Cervical: No cervical adenopathy.  Skin:    General: Skin is warm and dry.  Neurological:     General: No focal deficit present.     Mental Status: She is alert and oriented to person, place, and time.  Psychiatric:        Mood and Affect: Mood normal.        Behavior: Behavior normal.     Lab Results  Component Value Date   WBC 6.6 10/01/2018   HGB 14.0 10/01/2018   HCT 42.4 10/01/2018   PLT 222.0 10/01/2018   GLUCOSE 93 10/01/2018   CHOL 198 10/01/2018   TRIG 113.0 10/01/2018   HDL 45.50 10/01/2018   LDLDIRECT 152.0 03/30/2018   LDLCALC 130 (H) 10/01/2018   ALT 26 03/30/2018   AST 21 03/30/2018   NA 140 10/01/2018   K 3.4 (L)  10/01/2018   CL 103 10/01/2018   CREATININE 0.98 10/01/2018   BUN 16 10/01/2018   CO2 28 10/01/2018   TSH 1.53 03/30/2018   INR 1.1 (H) 06/03/2012   HGBA1C 5.8 03/30/2018    Dg Knee Complete 4 Views Left  Result Date: 07/01/2018 CLINICAL DATA:  One month history of LEFT knee pain. No known injuries. EXAM: LEFT KNEE - COMPLETE 4+ VIEW COMPARISON:  No prior knee imaging. LEFT tibia fibula 12/04/2007 is correlated. FINDINGS: Images were obtained with the patient weight-bearing. No evidence of acute fracture or dislocation. Well-preserved joint spaces. Well-preserved bone mineral density. No intrinsic osseous abnormality. No visible joint effusion. No evidence of a patellar tracking abnormality on the sunrise view. IMPRESSION: Normal examination. Electronically Signed   By: Evangeline Dakin M.D.   On: 07/01/2018 13:47   Dg Foot Complete Left  Result Date: 07/01/2018 CLINICAL DATA:  One month history of LEFT foot pain and swelling. No known injuries. EXAM: LEFT FOOT - COMPLETE 3+ VIEW COMPARISON:  07/29/2013, 06/20/2012. FINDINGS: No evidence of acute or subacute fracture or dislocation. Well-preserved joint spaces. Well-preserved bone mineral density. Bipartite os peroneum and mildly irregular os naviculare, incidental accessory ossicles, again noted. IMPRESSION: No significant abnormality. Electronically Signed   By: Evangeline Dakin M.D.   On: 07/01/2018 13:45    Assessment & Plan:   Verley was seen today for hypertension and hyperlipidemia.  Diagnoses and all orders for this visit:  Essential hypertension- Her blood pressure is not adequately well controlled.  I do not think she is compliant with her antihypertensives.  I have asked her to be compliant with her antihypertensives and to improve her lifestyle modifications. -  CBC with Differential/Platelet; Future -     Basic metabolic panel; Future  Hyperlipidemia LDL goal <100- She has not achieved her LDL goal.  She is taking the  statin.  I have asked her to restart the statin. -     Lipid panel; Future -     rosuvastatin (CRESTOR) 20 MG tablet; Take 1 tablet (20 mg total) by mouth daily.  Persistent proteinuria- I ordered a UA today but she did not submit urine because she said a urologist did a UA 1 day prior to this visit. -     Urinalysis, Routine w reflex microscopic; Future  Vitamin D deficiency disease -     VITAMIN D 25 Hydroxy (Vit-D Deficiency, Fractures); Future -     Cholecalciferol 1.25 MG (50000 UT) capsule; Take 1 capsule (50,000 Units total) by mouth once a week.  Chronic diastolic CHF (congestive heart failure) ([place])- Her pulse ox did not go below 92% at rest or with exercise today in the office.  I therefore do not think an insurance company would pay for continuous oxygen therapy.  -     6 minute walk; Future  OSA (obstructive sleep apnea)- I have asked her to undergo another sleep study to see if she desaturates during sleep. -     Ambulatory referral to Sleep Studies   I have discontinued Kimala D. Vanbeek's promethazine, Cholecalciferol, and acetaminophen-codeine. I am also having her start on Cholecalciferol. Additionally, I am having her maintain her Flutter, OXYGEN, Nitrostat, dextromethorphan-guaiFENesin, dicyclomine, EPINEPHrine, albuterol, lidocaine-prilocaine, amLODipine, diltiazem, DULoxetine, DULoxetine, furosemide, clotrimazole, AeroChamber MV, Suvorexant, valsartan, ranolazine, montelukast, Dexilant, Nebivolol HCl, torsemide, primidone, spironolactone, Potassium Chloride ER, Diclofenac Sodium, Symbicort, albuterol, acetaZOLAMIDE, hydrALAZINE, cloNIDine, amitriptyline, and rosuvastatin.  Meds ordered this encounter  Medications  . Cholecalciferol 1.25 MG (50000 UT) capsule    Sig: Take 1 capsule (50,000 Units total) by mouth once a week.    Dispense:  12 capsule    Refill:  1  . rosuvastatin (CRESTOR) 20 MG tablet    Sig: Take 1 tablet (20 mg total) by mouth daily.    Dispense:  90  tablet    Refill:  1     Follow-up: Return in about 3 months (around 01/01/2019).  Scarlette Calico, MD

## 2018-10-02 ENCOUNTER — Encounter: Payer: Self-pay | Admitting: Internal Medicine

## 2018-10-06 ENCOUNTER — Other Ambulatory Visit: Payer: Self-pay | Admitting: Internal Medicine

## 2018-10-06 MED ORDER — DENOSUMAB 60 MG/ML ~~LOC~~ SOSY
PREFILLED_SYRINGE | SUBCUTANEOUS | Status: AC
  Filled 2018-10-06: qty 1

## 2018-10-06 NOTE — Telephone Encounter (Signed)
Dr. Melvyn Novas, please advise if you are okay sending refill of med to pt's pharmacy. Thank you!  Instructions  zpak   Prednisone 10 mg take  4 each am x 2 days,   2 each am x 2 days,  1 each am x 2 days and stop   For cough > mucinex dm 1200 mg twice daily and supplement with tylenol #4  Every 4 hours and use the flutter valve   Bystolic 20 mg now and daily   Tylenol #4 at bedtime to see if helps cpap  See Tammy NP w/in  2 weeks with all your medications and inhalers and pill organizers, even over the counter meds, separated in two separate bags, the ones you take no matter what vs the ones you stop once you feel better and take only as needed when you feel you need them.   Tammy  will generate for you a new user friendly medication calendar that will put [place] all on the same page re: your medication use.     Without this process, it simply isn't possible to assure that we are providing  your outpatient care  with  the attention to detail we feel you deserve.   If we cannot assure that you're getting that kind of care,  then we cannot manage your problem effectively from this clinic.  Once you have seen Tammy and we are sure that we're all on the same page with your medication use she will arrange follow up with me.

## 2018-10-08 ENCOUNTER — Other Ambulatory Visit (HOSPITAL_COMMUNITY): Payer: Self-pay | Admitting: Nephrology

## 2018-10-20 ENCOUNTER — Telehealth (HOSPITAL_COMMUNITY): Payer: Self-pay | Admitting: Rehabilitation

## 2018-10-20 NOTE — Telephone Encounter (Signed)
I have made several attempts to contact this patient to schedule her renal duplex ordered by Dr. Harrie Jeans. I have been unsuccessful in reaching the patient. Each time an attempt was made, the aptient does not answer. Also, the phone continues to ring as she has no voicemail set up to leave aa message. Below are the times my office has tried to reach the patient for scheduling: -10/08/2018 @ 12:08 pm -10/19/2018 @ 8:43 am -10/14/2018 @ 9:37 am -10/15/2018 @ 3:40 pm -10/19/2018 @ 2:16 pm  At this point, I have contacted the referring office, [facility], to notify the provider of multiple unsuccessful attempts to reach their patient to have the ultrasound performed. I also have left a message asking the provider to call me directly to schedule an appointment, if they were comfortable. [place]

## 2018-10-21 ENCOUNTER — Ambulatory Visit: Payer: Medicare Other

## 2018-10-21 ENCOUNTER — Other Ambulatory Visit: Payer: Self-pay

## 2018-10-21 ENCOUNTER — Inpatient Hospital Stay: Payer: Medicare HMO | Attending: Oncology

## 2018-10-21 VITALS — BP 175/98 | HR 92 | Temp 98.7°F | Resp 18

## 2018-10-21 DIAGNOSIS — C641 Malignant neoplasm of right kidney, except renal pelvis: Secondary | ICD-10-CM | POA: Diagnosis not present

## 2018-10-21 DIAGNOSIS — Z5111 Encounter for antineoplastic chemotherapy: Secondary | ICD-10-CM | POA: Insufficient documentation

## 2018-10-21 DIAGNOSIS — C50412 Malignant neoplasm of upper-outer quadrant of left female breast: Secondary | ICD-10-CM

## 2018-10-21 DIAGNOSIS — N281 Cyst of kidney, acquired: Secondary | ICD-10-CM | POA: Diagnosis not present

## 2018-10-21 MED ORDER — GOSERELIN ACETATE 3.6 MG ~~LOC~~ IMPL
DRUG_IMPLANT | SUBCUTANEOUS | Status: AC
  Filled 2018-10-21: qty 3.6

## 2018-10-21 MED ORDER — GOSERELIN ACETATE 3.6 MG ~~LOC~~ IMPL
3.6000 mg | DRUG_IMPLANT | Freq: Once | SUBCUTANEOUS | Status: AC
  Administered 2018-10-21: 3.6 mg via SUBCUTANEOUS

## 2018-10-21 NOTE — Patient Instructions (Signed)

## 2018-10-22 ENCOUNTER — Ambulatory Visit (INDEPENDENT_AMBULATORY_CARE_PROVIDER_SITE_OTHER): Payer: Medicare HMO | Admitting: Neurology

## 2018-10-22 ENCOUNTER — Encounter: Payer: Self-pay | Admitting: Neurology

## 2018-10-22 VITALS — BP 182/108 | HR 104 | Temp 98.6°F | Ht 66.0 in | Wt 201.0 lb

## 2018-10-22 DIAGNOSIS — R05 Cough: Secondary | ICD-10-CM | POA: Diagnosis not present

## 2018-10-22 DIAGNOSIS — G620 Drug-induced polyneuropathy: Secondary | ICD-10-CM | POA: Diagnosis not present

## 2018-10-22 DIAGNOSIS — G4734 Idiopathic sleep related nonobstructive alveolar hypoventilation: Secondary | ICD-10-CM | POA: Diagnosis not present

## 2018-10-22 DIAGNOSIS — G4733 Obstructive sleep apnea (adult) (pediatric): Secondary | ICD-10-CM | POA: Diagnosis not present

## 2018-10-22 DIAGNOSIS — R058 Other specified cough: Secondary | ICD-10-CM

## 2018-10-22 DIAGNOSIS — G932 Benign intracranial hypertension: Secondary | ICD-10-CM | POA: Diagnosis not present

## 2018-10-22 DIAGNOSIS — I5032 Chronic diastolic (congestive) heart failure: Secondary | ICD-10-CM

## 2018-10-22 NOTE — Progress Notes (Signed)
SLEEP MEDICINE CLINIC    Provider:  Larey Seat, MD  Primary Care Physician:  [name], MD [address] [address]     Referring Provider: [name], Md [address] [facility] [address] [place],  [place] [address]          Chief Complaint according to patient   Patient presents with:     New Patient (Initial Visit)           HISTORY OF PRESENT ILLNESS:  Erica [name] is a 43 y.o. year old 24 or [place] American female patient seen here as a referral on 10/22/2018 from Dr. Eilleen Kempf, MD  for a sleep evaluation.  Chief concern according to patient : "My heart is racing and Dr Ottie Glazier thinks I may need oxygen at night". She has an EF of 35% according to her verbal report, presents with SOB, high heart rate and hoarse voice. All of this starting with left breast cancer in 2010 and chemotherapy and radiation.     I have the pleasure of seeing Erica [name] today, a right -handed Black or [place] American female with  Documented OSA sleep disorder.  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    The patient had the first sleep study in the year 2016  with a result of an AHI ( Apnea Hypopnea index)  of 31.1 , an oxygen saturation Nadir at SP02 %.    Sleep relevant medical history: Dr. 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  At her last visit, I recommended that she see pain management for  neuropathic pain and she reports seeing Dr. Letta Pate.  She remains on Lyrica 200mg  TID and Cymbalta 90mg .  She was unable to take Nucynta due to her history of asthma.    Her tremors are well controlled on primidone 25mg  daily.  She was also recommended to follow-up with [place] [facility] and neuro[facility] since she had VP shunt and adjustments to it may allow reduction in the dose of her diamox. She reports being rescheduled multiple times and did not ultimately go for a follow-up.  It has been over 2 years since her last eye exam.  She does not have new visual complaints or headaches.  She had not needed to go to the ER for large volume tap.  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     Social history:  Patient is disabled from being a Technical brewer and lives in a household with 3 persons. Family status is single , with 4 children, 63, 28 and 47 and 70 years old.  Tobacco use none .  ETOH use; rarely,  Caffeine intake in form of Coffee( none ) Soda( yes) Tea (none ) or energy drinks. Regular exercise ; none         Sleep  habits are as follows: The patient's dinner time is between 6 PM. The patient goes to bed at 9 PM and continues to sleep for 1 hour, wakes due to shortness of breath. The preferred sleep position is supine , with the support of elevated head of bed/  pillows. Dreams are reportedly rare/.  5.30 AM is the usual rise time. The patient wakes up spontaneously.   She reports not feeling refreshed or restored in AM, with symptoms such as dry mouth, coughing, phlegm,  morning headaches and residual fatigue. Naps are taken frequently, lasting from 30 to 60 minutes and are more refreshing than nocturnal sleep.      Review of Systems: Out of a complete 14 system review, the patient complains of only the following symptoms, and all other reviewed systems are negative.:  Fatigue, sleepiness , snoring, fragmented sleep, Insomnia due to fear, anxiety and shortness of breath    How likely are you to doze in the following situations: 0 = not likely, 1 = slight chance, 2 = moderate chance, 3 = high chance   Sitting and Reading? Watching Television? Sitting inactive in a public place (theater or meeting)? As a passenger in a car for an hour without a break? Lying down in the afternoon when circumstances permit? Sitting and talking to someone? Sitting quietly after lunch without alcohol? In a car, while stopped for a few minutes in traffic?   Total = 19/ 24 points  Excessive daytime sleepiness.   FSS endorsed at 48/ 63 points.   Social History   Socioeconomic History   Marital status: Single    Spouse name: Not on file   Number of children: 3   Years of education: Not on file   Highest education level: Not on file  Occupational History    Employer: UNEMPLOYED  Social Needs   Financial resource strain: Not on file   Food insecurity    Worry: Not on file    Inability: Not on file   Transportation needs    Medical: Not on file    Non-medical: Not on file  Tobacco Use   Smoking status: Never Smoker   Smokeless tobacco: Never Used  Substance and Sexual Activity   Alcohol use: Yes    Alcohol/week: 0.0 standard drinks    Comment: occasionally/socially   Drug use: No   Sexual activity: Yes    Birth control/protection: Surgical  Lifestyle   Physical activity    Days per week: Not on file    Minutes per session: Not on file   Stress: Not on file  Relationships   Social connections    Talks on phone: Not on file    Gets together: Not on file    Attends religious service: Not on file    Active member of club or organization: Not on file    Attends meetings of clubs or  organizations: Not on file    Relationship status: Not on file  Other Topics Concern   Not on file  Social History Narrative   She lives with four children.   She is currently not working (disbaility pending).       Family History  Problem Relation Age of Onset   Other Mother 65       breast calcifications treated with surgery, breast cancer pill, and radiation   Fibroids Sister 84       s/p TAH-BSO   Diabetes Maternal Grandmother    Heart disease Maternal Grandmother    Hypertension  Maternal Grandmother    Breast cancer Other        maternal great aunt (MGM's sister)   Breast cancer Other        paternal great aunt dx middle ages; s/p mastectomy   Cancer Neg Hx    Alcohol abuse Neg Hx    Early death Neg Hx    Hyperlipidemia Neg Hx    Kidney disease Neg Hx    Stroke Neg Hx    Colon cancer Neg Hx     Past Medical History:  Diagnosis Date   Asthma    Breast cancer ([place]) 2010   a. locally advanced left breast carcinoma diagnosed in 2010 and treated with neoadjuvant chemotherapy with docetaxel and cyclophosphamide as well as paclitaxel. This was followed by radiation therapy which was completed in 2011.   Dyspnea on exertion    a. 01/2013 Lexi MV: EF 54%, no ischemia/infarct;  b. 05/2013 Echo: EF 60-65%, no rwma, Gr 2 DD;  b.    Fatty liver    Gastroparesis    GERD (gastroesophageal reflux disease)    Hypertension    Impaired glucose tolerance 04/13/2011   Lymphadenitis, chronic    restricted LEFT extremity   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[place]) 09/27/2010   OSA (obstructive sleep apnea) 05/04/2014   AHI 31/hr now on CPAP at 12cm H2O   Personal history of chemotherapy 2010   Personal history of radiation therapy 2010    Past Surgical History:  Procedure Laterality Date   BRAIN SURGERY     Shunt   BREAST LUMPECTOMY Left 09/2008   CARPAL TUNNEL RELEASE Left 2011   LYMPH NODE DISSECTION Left 2010   breast; 2 wks after breast lumpectomy   TENDON  RELEASE Right 2012   "hand"   TUBAL LIGATION  05/11/2011   Procedure: POST PARTUM TUBAL LIGATION;  Surgeon: Emeterio Reeve, MD;  Location: [facility];  Service: Gynecology;  Laterality: Bilateral;  Induced for HTN     Current Outpatient Medications on File Prior to Visit  Medication Sig Dispense Refill   acetaminophen-codeine (TYLENOL #4) 300-60 MG tablet TAKE 1 TABLET BY MOUTH EVERY 4 (FOUR) HOURS AS NEEDED FOR MODERATE PAIN 30 tablet 0   acetaZOLAMIDE (DIAMOX) 250 MG tablet TAKE 4 TABLETS BY MOUTH 3 (THREE) TIMES DAILY. 1080 tablet 1   albuterol (PROVENTIL) (2.5 MG/3ML) 0.083% nebulizer solution USE 1 VIAL VIA NEBULIZER EVERY 4 HOURS AND AS NEEDED FOR WHEEZING OR SHORTNESS OF BREATH 75 mL 1   albuterol (VENTOLIN HFA) 108 (90 Base) MCG/ACT inhaler INHALE 2 PUFFS BY MOUTH EVERY 4 HOURS AS NEEDED FOR WHEEZE OR FOR SHORTNESS OF BREATH 18 Inhaler 1   amitriptyline (ELAVIL) 100 MG tablet TAKE 1 TABLET BY MOUTH EVERYDAY AT BEDTIME 90 tablet 0   amLODipine (NORVASC) 10 MG tablet Take 1 tablet (10 mg total) by mouth daily. 90 tablet 0   Cholecalciferol 1.25 MG (50000 UT) capsule Take 1 capsule (50,000 Units total) by mouth once a week. 12 capsule 1   cloNIDine (CATAPRES - DOSED IN MG/24 HR) 0.3 mg/24hr patch PLACE 1 PATCH ONTO SKIN ONCE WEEKLY 12 patch 1   clotrimazole (MYCELEX) 10 MG troche Take 1 tablet (10 mg total) by mouth 5 (five) times daily. 30 tablet 11   DEXILANT 60 MG capsule TAKE 1 CAPSULE BY MOUTH EVERY DAY 90 capsule 3   dextromethorphan-guaiFENesin (MUCINEX DM) 30-600 MG 12hr tablet Take 1 tablet by mouth 2 (two) times daily.      Diclofenac Sodium 2 %  SOLN Place 2 g onto the skin 2 (two) times daily. 112 g 3   dicyclomine (BENTYL) 20 MG tablet TAKE 1 TABLET BY MOUTH 3 TIMES DAILY AS NEEDED FOR SPASMS 90 tablet 1   diltiazem (CARDIZEM LA) 420 MG 24 hr tablet Take 1 tablet (420 mg total) by mouth daily. 90 tablet 0   DULoxetine (CYMBALTA) 30 MG capsule Take 1 capsule (30 mg  total) by mouth daily. With 60mg  for total of 90 mg 30 capsule 1   DULoxetine (CYMBALTA) 60 MG capsule Take 1 capsule (60 mg total) by mouth daily. For total of 90 mg 30 capsule 1   EPINEPHRINE 0.3 mg/0.3 mL IJ SOAJ injection INJECT 0.3 MLS INTO THE MUSCLE ONCE AS NEEDED FOR ANAPHYLAXIS 2 Device 2   furosemide (LASIX) 40 MG tablet TAKE 1 TABLET BY MOUTH TWICE A DAY 180 tablet 0   hydrALAZINE (APRESOLINE) 100 MG tablet TAKE 1 TABLET (100 MG TOTAL) BY MOUTH 3 (THREE) TIMES DAILY. 270 tablet 1   lidocaine-prilocaine (EMLA) cream Apply 1 application topically. Apply 1 application to skin ever 28 days     montelukast (SINGULAIR) 10 MG tablet TAKE 1 TABLET BY MOUTH ONCE DAILY AT BEDTIME 90 tablet 1   Nebivolol HCl 20 MG TABS Take 2 tablets (40 mg total) by mouth daily. 180 tablet 3   NITROSTAT 0.4 MG SL tablet PLACE 1 TABLET UNDER TONGUE EVERY 5 MINUTES AS NEEDED FOR CHEST PAIN, MAX OF 3 TABLETS THEN CALL 911 IF PAIN PERSISTS 100 tablet 0   OXYGEN Use CPAP at bedtime     Potassium Chloride ER 20 MEQ TBCR TAKE 1 TABLET BY MOUTH TWICE A DAY 180 tablet 0   primidone (MYSOLINE) 50 MG tablet Take 0.5 tablets (25 mg total) by mouth at bedtime. 45 tablet 1   ranolazine (RANEXA) 500 MG 12 hr tablet TAKE 1 TABLET BY MOUTH TWICE A DAY 180 tablet 2   Respiratory Therapy Supplies (FLUTTER) DEVI Use as directed 1 each 0   rosuvastatin (CRESTOR) 20 MG tablet Take 1 tablet (20 mg total) by mouth daily. 90 tablet 1   Spacer/Aero-Holding Chambers (AEROCHAMBER MV) inhaler Use as instructed 1 each 0   spironolactone (ALDACTONE) 100 MG tablet TAKE 1 TABLET BY MOUTH EVERY DAY 90 tablet 2   Suvorexant (BELSOMRA) 15 MG TABS Take 1 tablet by mouth daily. 30 tablet 5   SYMBICORT 160-4.5 MCG/ACT inhaler INHALE 2 PUFFS INTO LUNGS 2 TIMES DAILY AS DIRECTED 30.6 Inhaler 1   torsemide (DEMADEX) 20 MG tablet Take 1 tablet (20 mg total) by mouth daily. 90 tablet 1   valsartan (DIOVAN) 320 MG tablet Take 1 tablet  (320 mg total) by mouth daily. 90 tablet 1   No current facility-administered medications on file prior to visit.     Allergies  Allergen Reactions   Aspirin Shortness Of Breath and Palpitations    Pt can take ibuprofen without reaction   Doxycycline Anaphylaxis   Kiwi Extract Itching and Swelling    Lip swelling   Metoprolol Hives and Itching   Penicillins Shortness Of Breath and Palpitations    Has patient had a PCN reaction causing immediate rash, facial/tongue/throat swelling, SOB or lightheadedness with hypotension: Yes Has patient had a PCN reaction causing severe rash involving mucus membranes or skin necrosis: Yes Has patient had a PCN reaction that required hospitalization Yes Has patient had a PCN reaction occurring within the last 10 years: No If all of the above answers are "NO", then  may proceed with Cephalosporin use.   Strawberry Extract Hives   Sulfamethoxazole-Trimethoprim Anaphylaxis    Facial, throat and tongue swelling with difficulty swallowing.    Physical exam:  Today's Vitals   10/22/18 0901  BP: (!) 182/108  Pulse: (!) 104  Temp: 98.6 F (37 C)  Weight: 201 lb (91.2 kg)  Height: 5\' 6"  (1.676 m)   Body mass index is 32.44 kg/m.   Wt Readings from Last 3 Encounters:  10/22/18 201 lb (91.2 kg)  10/01/18 200 lb 12 oz (91.1 kg)  07/02/18 200 lb (90.7 kg)     Ht Readings from Last 3 Encounters:  10/22/18 5\' 6"  (1.676 m)  10/01/18 5\' 6"  (1.676 m)  07/02/18 5\' 6"  (1.676 m)      General: The patient is awake, alert and appears in acute distress. The patient is well groomed. Head: Normocephalic, atraumatic. Neck is supple. Mallampati 5 neck circumference:16 inches . Nasal airflow patent.  Retrognathia is high grade  seen.  Dental status: intact   Cardiovascular:  Regular rate and cardiac rhythm by pulse,  without distended neck veins. Respiratory: Lungs are clear to auscultation.  Skin:  lymphedema in left arm.  Trunk: The patient's  posture is erect.   Neurologic exam : The patient is awake and alert, oriented to place and time.   Memory subjective described as intact.  Attention span & concentration ability appears normal.  Speech is fluent,  without  dysarthria, dysphonia or aphasia.  Mood and affect are appropriate.   Cranial nerves: no loss of smell or taste reported  Pupils are equal and briskly reactive to light. Funduscopic exam deferred.   Extraocular movements in vertical and horizontal planes were intact and without nystagmus. No Diplopia. Visual fields by finger perimetry are intact. Hearing was intact to soft voice and finger rubbing.    Facial sensation intact to fine touch.  Facial motor strength is symmetric and tongue and uvula move midline.  Neck ROM : rotation, tilt and flexion extension were normal for age and shoulder shrug was symmetrical.    Motor exam:  Symmetric bulk, tone and ROM.  Left grip strength is weaker than right.   Normal tone without cog wheeling,.   Sensory:  Fine touch, pinprick and vibration were tested  and  normal.  Left arm and hand numbness from left shoulder to elbow, fingers tingle.  Proprioception tested in the upper extremities was normal.   Coordination: Rapid alternating movements in the fingers/hands were of normal speed.  The Finger-to-nose maneuver was intact without evidence of ataxia, dysmetria or tremor. Gait and station: Patient could rise unassisted from a seated position, walked without assistive device.  Stance is of normal width/ base and the patient turned with 3 steps.  Toe and heel walk were deferred.      After spending a total time of  40  minutes face to face and additional time for physical and neurologic examination, review of laboratory studies,  personal review of imaging studies, reports and results of other testing and review of referral information / records as far as provided in visit, I have established the following assessments:  1)  I  had no access to the patient's current CPAP, which she reportedly cannot use, she feels as if suffocating.  She never tolerated CPAP , always struggled to breath.  2) she may need oxygen indeed.  3) she was  using a loaner CPAP autotitration device until March 2020- none since.    My Plan  is to proceed with:  1) attended CPAP titration with one hour baseline in a patient with reduced EF, tachycardia and SOB, orthopnea.  Goal is to find the best tolerated PAP modality and pressure. Goal is also to give oxygen if needed and titrate to oxygen.     I would like to thank [name], Md [address] [facility] [address] [place],  [place] [address] for allowing me to meet with and to take care of this pleasant patient.   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   I plan to follow up either personally or through our NP within 1-2  month.   CC: I will share my notes with Dr Meda Coffee.   Electronically signed by: Larey Seat, MD 10/22/2018 9:11 AM  [facility] Board certified by The [employer] of Sleep Medicine and Diplomate of the [employer] of Sleep Medicine. Board certified In Neurology through the [place], Fellow of the [employer] of Neurology. Medical Director of [employer] [employer].

## 2018-10-25 ENCOUNTER — Other Ambulatory Visit: Payer: Self-pay | Admitting: Cardiology

## 2018-10-25 DIAGNOSIS — R079 Chest pain, unspecified: Secondary | ICD-10-CM

## 2018-10-25 DIAGNOSIS — I110 Hypertensive heart disease with heart failure: Secondary | ICD-10-CM

## 2018-10-26 ENCOUNTER — Other Ambulatory Visit: Payer: Self-pay | Admitting: Neurology

## 2018-10-26 DIAGNOSIS — N281 Cyst of kidney, acquired: Secondary | ICD-10-CM | POA: Diagnosis not present

## 2018-10-26 DIAGNOSIS — I5032 Chronic diastolic (congestive) heart failure: Secondary | ICD-10-CM

## 2018-10-26 DIAGNOSIS — G4733 Obstructive sleep apnea (adult) (pediatric): Secondary | ICD-10-CM

## 2018-10-26 DIAGNOSIS — G932 Benign intracranial hypertension: Secondary | ICD-10-CM

## 2018-10-29 ENCOUNTER — Ambulatory Visit (HOSPITAL_COMMUNITY)
Admission: RE | Admit: 2018-10-29 | Discharge: 2018-10-29 | Disposition: A | Discharge status: Home / Self Care | Payer: Medicare HMO | Source: Ambulatory Visit | Attending: Family | Admitting: Family

## 2018-10-29 ENCOUNTER — Other Ambulatory Visit (HOSPITAL_COMMUNITY): Payer: Self-pay | Admitting: Nephrology

## 2018-10-29 ENCOUNTER — Other Ambulatory Visit: Payer: Self-pay

## 2018-10-29 DIAGNOSIS — I10 Essential (primary) hypertension: Secondary | ICD-10-CM

## 2018-11-18 ENCOUNTER — Inpatient Hospital Stay: Payer: Medicare HMO | Attending: Oncology

## 2018-11-18 ENCOUNTER — Ambulatory Visit: Payer: Medicare HMO

## 2018-11-18 ENCOUNTER — Other Ambulatory Visit: Payer: Self-pay

## 2018-11-18 ENCOUNTER — Ambulatory Visit: Payer: Medicare Other

## 2018-11-18 VITALS — BP 214/119 | HR 99 | Temp 99.1°F | Resp 18

## 2018-11-18 DIAGNOSIS — I129 Hypertensive chronic kidney disease with stage 1 through stage 4 chronic kidney disease, or unspecified chronic kidney disease: Secondary | ICD-10-CM | POA: Diagnosis not present

## 2018-11-18 DIAGNOSIS — Z79818 Long term (current) use of other agents affecting estrogen receptors and estrogen levels: Secondary | ICD-10-CM | POA: Insufficient documentation

## 2018-11-18 DIAGNOSIS — C50412 Malignant neoplasm of upper-outer quadrant of left female breast: Secondary | ICD-10-CM | POA: Insufficient documentation

## 2018-11-18 DIAGNOSIS — Z170 Estrogen receptor positive status [ER+]: Secondary | ICD-10-CM | POA: Diagnosis not present

## 2018-11-18 MED ORDER — GOSERELIN ACETATE 3.6 MG ~~LOC~~ IMPL
DRUG_IMPLANT | SUBCUTANEOUS | Status: AC
  Filled 2018-11-18: qty 3.6

## 2018-11-18 MED ORDER — GOSERELIN ACETATE 3.6 MG ~~LOC~~ IMPL
3.6000 mg | DRUG_IMPLANT | SUBCUTANEOUS | Status: DC
  Administered 2018-11-18: 3.6 mg via SUBCUTANEOUS

## 2018-11-19 ENCOUNTER — Telehealth: Payer: Self-pay | Admitting: Oncology

## 2018-11-19 NOTE — Telephone Encounter (Signed)
Unable to reach pt per 8/20 sch message - and unable to leave message - mailed letter with appt d/t

## 2018-11-23 DIAGNOSIS — R801 Persistent proteinuria, unspecified: Secondary | ICD-10-CM | POA: Diagnosis not present

## 2018-11-23 DIAGNOSIS — I10 Essential (primary) hypertension: Secondary | ICD-10-CM | POA: Diagnosis not present

## 2018-11-23 DIAGNOSIS — N281 Cyst of kidney, acquired: Secondary | ICD-10-CM | POA: Diagnosis not present

## 2018-11-23 DIAGNOSIS — R7303 Prediabetes: Secondary | ICD-10-CM | POA: Diagnosis not present

## 2018-11-23 DIAGNOSIS — G932 Benign intracranial hypertension: Secondary | ICD-10-CM | POA: Diagnosis not present

## 2018-11-23 DIAGNOSIS — I701 Atherosclerosis of renal artery: Secondary | ICD-10-CM | POA: Diagnosis not present

## 2018-11-25 ENCOUNTER — Encounter: Payer: Self-pay | Admitting: Vascular Surgery

## 2018-11-25 ENCOUNTER — Other Ambulatory Visit: Payer: Self-pay

## 2018-11-25 ENCOUNTER — Ambulatory Visit (INDEPENDENT_AMBULATORY_CARE_PROVIDER_SITE_OTHER): Payer: Medicare HMO | Admitting: Vascular Surgery

## 2018-11-25 VITALS — BP 190/115 | HR 108 | Temp 97.7°F | Resp 20 | Ht 66.0 in | Wt 198.0 lb

## 2018-11-25 DIAGNOSIS — I10 Essential (primary) hypertension: Secondary | ICD-10-CM | POA: Diagnosis not present

## 2018-11-25 NOTE — Progress Notes (Signed)
REASON FOR CONSULT:    Evaluate for renal artery stenosis.  The consult is requested by Dr. Harrie Jeans.  ASSESSMENT & PLAN:   POORLY CONTROLLED HYPERTENSION: This patient has fairly high blood pressure which is been very difficult to control.  As documented below she is on multiple medications for her blood pressure and her blood pressure remains significantly elevated.  Although her duplex did not show any significant renal artery stenosis I feel that it would be best to proceed with a CT angiogram to further evaluate her renal arteries.  If this confirms the finding on duplex and I think we can be confident that her blood pressure issues not related to renal artery stenosis.  If on the other hand there is some suggestion of significant renal artery stenosis on CT angiogram she may require possible arteriography to further assess this.  Normally I would not necessarily obtain a CT angiogram given the findings on duplex, however given the severity of her hypertension and the multiple medication she is on I feel that this is indicated.  I will then have a virtual visit with her after the scan is done.  Erica Mayo, MD, FACS Beeper [phone] Office: [phone]   HPI:   Erica [name] is a pleasant 43 y.o. female, who is referred for evaluation of renal artery stenosis.  In addition, the patient has a history of idiopathic intracranial hypertension, and chronic systolic and diastolic congestive heart failure.  On my history the patient states that she has had problems controlling her blood pressure since she finished chemotherapy for breast cancer back in 2011.  However recently the blood pressure has been more difficult to control.  She is currently on multiple medications for her blood pressure including Norvasc, clonidine, Aldactone, hydralazine, and Diamox.  She does tell me that she is on medications for her asthma which make her blood pressure more difficult to control.  She is unaware  of any problems with chronic kidney disease.  She denies any headaches.  She denies any hematuria.  Her risk factors for vascular disease include hypertension,.  She denies any history of diabetes, hypercholesterolemia, family history of premature cardiovascular disease, or smoking.  Past Medical History:  Diagnosis Date  . Asthma   . Breast cancer ([place]) 2010   a. locally advanced left breast carcinoma diagnosed in 2010 and treated with neoadjuvant chemotherapy with docetaxel and cyclophosphamide as well as paclitaxel. This was followed by radiation therapy which was completed in 2011.  Marland Kitchen Dyspnea on exertion    a. 01/2013 Lexi MV: EF 54%, no ischemia/infarct;  b. 05/2013 Echo: EF 60-65%, no rwma, Gr 2 DD;  b.   . Fatty liver   . Gastroparesis   . GERD (gastroesophageal reflux disease)   . Hypertension   . Impaired glucose tolerance 04/13/2011  . Lymphadenitis, chronic    restricted LEFT extremity  . 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 OSA (obstructive sleep apnea) 05/04/2014   AHI 31/hr now on CPAP at 12cm H2O  . Personal history of chemotherapy 2010  . Personal history of radiation therapy 2010    Family History  Problem Relation Age of Onset  . Other Mother 38       breast calcifications treated with surgery, breast cancer pill, and radiation  . Fibroids Sister 8       s/p TAH-BSO  . Diabetes Maternal Grandmother   . Heart disease Maternal Grandmother   . Hypertension Maternal Grandmother   . Breast  cancer Other        maternal great aunt (MGM's sister)  . Breast cancer Other        paternal great aunt dx middle ages; s/p mastectomy  . Cancer Neg Hx   . Alcohol abuse Neg Hx   . Early death Neg Hx   . Hyperlipidemia Neg Hx   . Kidney disease Neg Hx   . Stroke Neg Hx   . Colon cancer Neg Hx     SOCIAL HISTORY: Social History   Socioeconomic History  . Marital status: Single    Spouse name: Not on file  . Number of children: 3  . Years of education: Not on  file  . Highest education level: Not on file  Occupational History    Employer: UNEMPLOYED  Social Needs  . Financial resource strain: Not on file  . Food insecurity    Worry: Not on file    Inability: Not on file  . Transportation needs    Medical: Not on file    Non-medical: Not on file  Tobacco Use  . Smoking status: Never Smoker  . Smokeless tobacco: Never Used  Substance and Sexual Activity  . Alcohol use: Yes    Alcohol/week: 0.0 standard drinks    Comment: occasionally/socially  . Drug use: No  . Sexual activity: Yes    Birth control/protection: Surgical  Lifestyle  . Physical activity    Days per week: Not on file    Minutes per session: Not on file  . Stress: Not on file  Relationships  . Social Herbalist on phone: Not on file    Gets together: Not on file    Attends religious service: Not on file    Active member of club or organization: Not on file    Attends meetings of clubs or organizations: Not on file    Relationship status: Not on file  . Intimate partner violence    Fear of current or ex partner: Not on file    Emotionally abused: Not on file    Physically abused: Not on file    Forced sexual activity: Not on file  Other Topics Concern  . Not on file  Social History Narrative   She lives with four children.   She is currently not working (disbaility pending).       Allergies  Allergen Reactions  . Aspirin Shortness Of Breath and Palpitations    Pt can take ibuprofen without reaction  . Doxycycline Anaphylaxis  . Kiwi Extract Itching and Swelling    Lip swelling  . Metoprolol Hives and Itching  . Penicillins Shortness Of Breath and Palpitations    Has patient had a PCN reaction causing immediate rash, facial/tongue/throat swelling, SOB or lightheadedness with hypotension: Yes Has patient had a PCN reaction causing severe rash involving mucus membranes or skin necrosis: Yes Has patient had a PCN reaction that required  hospitalization Yes Has patient had a PCN reaction occurring within the last 10 years: No If all of the above answers are "NO", then may proceed with Cephalosporin use.  . Strawberry Extract Hives  . Sulfamethoxazole-Trimethoprim Anaphylaxis    Facial, throat and tongue swelling with difficulty swallowing.    Current Outpatient Medications  Medication Sig Dispense Refill  . acetaminophen-codeine (TYLENOL #4) 300-60 MG tablet TAKE 1 TABLET BY MOUTH EVERY 4 (FOUR) HOURS AS NEEDED FOR MODERATE PAIN 30 tablet 0  . acetaZOLAMIDE (DIAMOX) 250 MG tablet TAKE 4 TABLETS BY MOUTH 3 (  THREE) TIMES DAILY. 1080 tablet 1  . albuterol (PROVENTIL) (2.5 MG/3ML) 0.083% nebulizer solution USE 1 VIAL VIA NEBULIZER EVERY 4 HOURS AND AS NEEDED FOR WHEEZING OR SHORTNESS OF BREATH 75 mL 1  . albuterol (VENTOLIN HFA) 108 (90 Base) MCG/ACT inhaler INHALE 2 PUFFS BY MOUTH EVERY 4 HOURS AS NEEDED FOR WHEEZE OR FOR SHORTNESS OF BREATH 18 Inhaler 1  . amitriptyline (ELAVIL) 100 MG tablet TAKE 1 TABLET BY MOUTH EVERYDAY AT BEDTIME 90 tablet 0  . amLODipine (NORVASC) 10 MG tablet Take 1 tablet (10 mg total) by mouth daily. 90 tablet 0  . Cholecalciferol 1.25 MG (50000 UT) capsule Take 1 capsule (50,000 Units total) by mouth once a week. 12 capsule 1  . cloNIDine (CATAPRES - DOSED IN MG/24 HR) 0.3 mg/24hr patch PLACE 1 PATCH ONTO SKIN ONCE WEEKLY 12 patch 1  . clotrimazole (MYCELEX) 10 MG troche Take 1 tablet (10 mg total) by mouth 5 (five) times daily. 30 tablet 11  . DEXILANT 60 MG capsule TAKE 1 CAPSULE BY MOUTH EVERY DAY 90 capsule 3  . dextromethorphan-guaiFENesin (MUCINEX DM) 30-600 MG 12hr tablet Take 1 tablet by mouth 2 (two) times daily.     . Diclofenac Sodium 2 % SOLN Place 2 g onto the skin 2 (two) times daily. 112 g 3  . dicyclomine (BENTYL) 20 MG tablet TAKE 1 TABLET BY MOUTH 3 TIMES DAILY AS NEEDED FOR SPASMS 90 tablet 1  . diltiazem (CARDIZEM LA) 420 MG 24 hr tablet Take 1 tablet (420 mg total) by mouth daily.  90 tablet 0  . DULoxetine (CYMBALTA) 30 MG capsule Take 1 capsule (30 mg total) by mouth daily. With 60mg  for total of 90 mg 30 capsule 1  . DULoxetine (CYMBALTA) 60 MG capsule Take 1 capsule (60 mg total) by mouth daily. For total of 90 mg 30 capsule 1  . EPINEPHRINE 0.3 mg/0.3 mL IJ SOAJ injection INJECT 0.3 MLS INTO THE MUSCLE ONCE AS NEEDED FOR ANAPHYLAXIS 2 Device 2  . furosemide (LASIX) 40 MG tablet TAKE 1 TABLET BY MOUTH TWICE A DAY 180 tablet 0  . hydrALAZINE (APRESOLINE) 100 MG tablet TAKE 1 TABLET (100 MG TOTAL) BY MOUTH 3 (THREE) TIMES DAILY. 270 tablet 1  . lidocaine-prilocaine (EMLA) cream Apply 1 application topically. Apply 1 application to skin ever 28 days    . montelukast (SINGULAIR) 10 MG tablet TAKE 1 TABLET BY MOUTH ONCE DAILY AT BEDTIME 90 tablet 1  . Nebivolol HCl 20 MG TABS Take 2 tablets (40 mg total) by mouth daily. 180 tablet 3  . NITROSTAT 0.4 MG SL tablet PLACE 1 TABLET UNDER TONGUE EVERY 5 MINUTES AS NEEDED FOR CHEST PAIN, MAX OF 3 TABLETS THEN CALL 911 IF PAIN PERSISTS 100 tablet 0  . OXYGEN Use CPAP at bedtime    . Potassium Chloride ER 20 MEQ TBCR TAKE 1 TABLET BY MOUTH TWICE A DAY 180 tablet 0  . primidone (MYSOLINE) 50 MG tablet Take 0.5 tablets (25 mg total) by mouth at bedtime. 45 tablet 1  . ranolazine (RANEXA) 500 MG 12 hr tablet TAKE 1 TABLET BY MOUTH TWICE A DAY 180 tablet 2  . Respiratory Therapy Supplies (FLUTTER) DEVI Use as directed 1 each 0  . rosuvastatin (CRESTOR) 20 MG tablet Take 1 tablet (20 mg total) by mouth daily. 90 tablet 1  . Spacer/Aero-Holding Chambers (AEROCHAMBER MV) inhaler Use as instructed 1 each 0  . spironolactone (ALDACTONE) 100 MG tablet TAKE 1 TABLET BY MOUTH EVERY DAY  90 tablet 2  . Suvorexant (BELSOMRA) 15 MG TABS Take 1 tablet by mouth daily. 30 tablet 5  . SYMBICORT 160-4.5 MCG/ACT inhaler INHALE 2 PUFFS INTO LUNGS 2 TIMES DAILY AS DIRECTED 30.6 Inhaler 1  . torsemide (DEMADEX) 20 MG tablet Take 1 tablet (20 mg total) by  mouth daily. 90 tablet 1  . valsartan (DIOVAN) 320 MG tablet TAKE 1 TABLET BY MOUTH EVERY DAY 90 tablet 1   No current facility-administered medications for this visit.     REVIEW OF SYSTEMS:  [X]  denotes positive finding, [ ]  denotes negative finding Cardiac  Comments:  Chest pain or chest pressure: x   Shortness of breath upon exertion: x   Short of breath when lying flat: x   Irregular heart rhythm: x       Vascular    Pain in calf, thigh, or hip brought on by ambulation: x   Pain in feet at night that wakes you up from your sleep:     Blood clot in your veins:    Leg swelling:  x       Pulmonary    Oxygen at home:    Productive cough:  x   Wheezing:  x       Neurologic    Sudden weakness in arms or legs:     Sudden numbness in arms or legs:     Sudden onset of difficulty speaking or slurred speech:    Temporary loss of vision in one eye:     Problems with dizziness:  x       Gastrointestinal    Blood in stool:     Vomited blood:         Genitourinary    Burning when urinating:     Blood in urine:        Psychiatric    Major depression:         Hematologic    Bleeding problems:    Problems with blood clotting too easily:        Skin    Rashes or ulcers:        Constitutional    Fever or chills:     PHYSICAL EXAM:   Vitals:   11/25/18 1312  BP: (!) 190/115  Pulse: (!) 108  Resp: 20  Temp: 97.7 F (36.5 C)  SpO2: 98%  Weight: 198 lb (89.8 kg)  Height: 5\' 6"  (1.676 m)   GENERAL: The patient is a well-nourished female, in no acute distress. The vital signs are documented above. CARDIAC: There is a regular rate and rhythm.  VASCULAR: I do not detect carotid bruits. On the right side she has a palpable dorsalis pedis and posterior tibial pulse. On the left side she has a palpable posterior tibial pulse.  I cannot palpate a dorsalis pedis pulse. She currently has no significant lower extremity swelling. PULMONARY: There is good air exchange  bilaterally without wheezing or rales. ABDOMEN: Soft and non-tender with normal pitched bowel sounds.  I do not appreciate any abdominal bruits. MUSCULOSKELETAL: There are no major deformities or cyanosis. NEUROLOGIC: No focal weakness or paresthesias are detected. SKIN: There are no ulcers or rashes noted. PSYCHIATRIC: The patient has a normal affect.  DATA:    RENAL ARTERY DUPLEX: I have reviewed the renal artery duplex scan that was done on 10/29/2018.  It was a less than 60% renal artery stenosis bilaterally.  The right kidney measured 12 cm in length.  The left kidney measured 11.7  cm in length.  LABS: Her GFR in January of this year was 8.  Her creatinine in July of this year was 0.98.

## 2018-11-26 ENCOUNTER — Other Ambulatory Visit: Payer: Self-pay | Admitting: Vascular Surgery

## 2018-11-26 DIAGNOSIS — I872 Venous insufficiency (chronic) (peripheral): Secondary | ICD-10-CM

## 2018-12-04 ENCOUNTER — Other Ambulatory Visit: Payer: Self-pay

## 2018-12-04 ENCOUNTER — Ambulatory Visit (INDEPENDENT_AMBULATORY_CARE_PROVIDER_SITE_OTHER): Payer: Medicare Other

## 2018-12-04 DIAGNOSIS — Z23 Encounter for immunization: Secondary | ICD-10-CM

## 2018-12-08 ENCOUNTER — Other Ambulatory Visit: Payer: Self-pay | Admitting: *Deleted

## 2018-12-08 DIAGNOSIS — C50412 Malignant neoplasm of upper-outer quadrant of left female breast: Secondary | ICD-10-CM

## 2018-12-08 NOTE — Progress Notes (Signed)
[place]  [phone]) [phone] [phone]   ID: Erica [name]   DOB: Sep 03, 1975  MR#: [id number]  XKG#:[id number]  FWY:OVZCH, Arvid Right, MD GYN: SU:  Erroll Luna, MD Other:  Christinia Gully, MD;  Zenovia Jarred, MD;  Ena Dawley, MD, Luberta Mutter MD, Everitt Amber MD  CHIEF COMPLAINT:  Triple negative Breast Cancer  CURRENT TREATMENT: Observation  BREAST CANCER HISTORY: From the original intake note:  Erica [name] noticed a lump in her left breast sometime in August or September 2009.  Initially this was thought to possibly be dry breast milk but as the mass got bigger, she brought it to her physicians attention and was set up for mammography.  This was performed May 20, 2008 at [facility] and it showed a palpable 8.5 cm oval circumscribed mass in the left breast upper outer quadrant.  There were prominent left axillary lymph nodes noted as well.  An ultrasound showed this to be hypoechoic, with lobulated margins and probable central necrosis measuring up to 7.3 cm on this modality.  Ultrasound of the left axilla demonstrated numerous prominent lymph nodes at the upper limit of normal, the largest measuring 1 cm with a normal appearing thin cortex and a normal cortex to halo ratio.    Because the patient had no insurance, she was referred to [facility] and biopsy was performed in [place] with the pathology there ([id number]) showing a poorly differentiated malignancy which was triple negative (ER 1%, PR 0%, HER-2/neu 0).  The axillary lymph nodes were not biopsied. With this information, the patient was presented at the Breast Multidisciplinary Conference and the feeling was that, after appropriate staging, the patient would be a good candidate for neoadjuvant treatment and possibly NSABP B40.    Her subsequent history is as detailed below   INTERVAL HISTORY: Erica [name] returns today for follow-up and treatment of her estrogen receptor negative breast cancer.  She was last seen here on 11/19/2017.   She continues to receive goserelin every 4 weeks.  She is actually due a week from today but to save her an extra trip she will receive a treatment today, even though it is a week early.  She continues under observation. She is behind on her mammography, which was last completed on 11/20/2017 at [facility].  Since her last visit here, she underwent an abdominal ultrasound on 03/18/2018 showing: No acute finding. Complex cyst in the interpolar right kidney measuring 2.7 cm. Recommend cross-sectional imaging follow-up, preferably MRI with contrast. 0.3 cm gallbladder polyp.  She also underwent a left foot xray and a left knee xray on 07/01/2018 for a history of pain and swelling showing no significant abnormalities.    REVIEW OF SYSTEMS: Erica [name] is being worked up for renovascular hypertension.  She is on multiple blood pressure medicines and she tells me she is taking them nevertheless her blood pressure today remains high.  She is at risk of strokes, end-stage renal disease, and heart attacks among other possible complications.  To me this is by far the most worrisome thing.  Aside from this she is staying at home with her 65 children age now between 49 and 62.  She spends time at her mother's in [place] and also here.  She is not exercising.  Detailed review of systems today was otherwise stable   PAST MEDICAL HISTORY: Past Medical History:  Diagnosis Date   Asthma    Breast cancer ([place]) 2010   a. locally advanced left breast  carcinoma diagnosed in 2010 and treated with neoadjuvant chemotherapy with docetaxel and cyclophosphamide as well as paclitaxel. This was followed by radiation therapy which was completed in 2011.   Dyspnea on exertion    a. 01/2013 Lexi MV: EF 54%, no ischemia/infarct;  b. 05/2013 Echo: EF 60-65%, no rwma, Gr 2 DD;  b.    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   OSA (obstructive sleep apnea) 05/04/2014   AHI 31/hr now on CPAP at 12cm H2O   Personal history of chemotherapy 2010   Personal history of radiation therapy 2010    PAST SURGICAL HISTORY: Past Surgical History:  Procedure Laterality Date   BRAIN SURGERY     Shunt   BREAST LUMPECTOMY Left 09/2008   CARPAL TUNNEL RELEASE Left 2011   LYMPH NODE DISSECTION Left 2010   breast; 2 wks after breast lumpectomy   TENDON RELEASE Right 2012   "hand"   TUBAL LIGATION  05/11/2011   Procedure: POST PARTUM TUBAL LIGATION;  Surgeon: Emeterio Reeve, MD;  Location: [facility];  Service: Gynecology;  Laterality: Bilateral;  Induced for HTN    FAMILY HISTORY Family History  Problem Relation Age of Onset   Other Mother 26       breast calcifications treated with surgery, breast cancer pill, and radiation   Fibroids Sister 97       s/p TAH-BSO   Diabetes Maternal Grandmother    Heart disease Maternal Grandmother    Hypertension Maternal Grandmother    Breast cancer Other        maternal great aunt (MGM's sister)   Breast cancer Other        paternal great aunt dx middle ages; s/p mastectomy   Cancer Neg Hx    Alcohol abuse Neg Hx    Early death Neg Hx    Hyperlipidemia Neg Hx    Kidney disease Neg Hx    Stroke Neg Hx    Colon cancer Neg Hx   The patients parents are alive. The patient has one brother and one sister.  There is no ovarian or breast cancer in the immediate family, but the patients maternal grandmother, who was one of 42 sisters, had one sister with breast cancer diagnosed in her 36.    GYNECOLOGIC HISTORY: updated January 2015 The patient is GX P4, first pregnancy to term at age 44.  She stopped having periods at the time of chemotherapy, and had had no periods for  2 years, then became pregnant in 2012. This was a  surprise to her, and she was not aware of the pregnancy until the seventh month. She has not resumed normal menses, but has had some "spotting" on and off for the last several months.  SOCIAL HISTORY: updated September 2020 She has been a Dance movement psychotherapist in the past. She's currently disabled. She splits her time between [place] and [place], [place]. She's currently single. Erica [name] has four children,  currently between the ages of 45 and 40.   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 Meda Coffee   Allergies  Allergen Reactions   Aspirin Shortness Of Breath and Palpitations    Pt can take ibuprofen without reaction   Doxycycline Anaphylaxis   Kiwi Extract Itching and Swelling    Lip swelling   Metoprolol Hives and Itching   Penicillins Shortness Of Breath and Palpitations    Has patient had a PCN reaction causing immediate rash, facial/tongue/throat swelling, SOB or lightheadedness with hypotension: Yes Has patient had a PCN reaction causing severe rash involving mucus membranes or skin necrosis: Yes Has patient had a PCN reaction that required hospitalization Yes Has patient had a PCN reaction occurring within the last 10 years: No If all of the above answers are "NO", then may proceed with Cephalosporin use.   Strawberry Extract Hives   Sulfamethoxazole-Trimethoprim Anaphylaxis    Facial, throat and tongue swelling with difficulty swallowing.    Current Outpatient Medications  Medication Sig Dispense Refill   acetaminophen-codeine (TYLENOL #4) 300-60 MG tablet TAKE 1 TABLET BY MOUTH  EVERY 4 (FOUR) HOURS AS NEEDED FOR MODERATE PAIN 30 tablet 0   acetaZOLAMIDE (DIAMOX) 250 MG tablet TAKE 4 TABLETS BY MOUTH 3 (THREE) TIMES DAILY. 1080 tablet 1   albuterol (PROVENTIL) (2.5 MG/3ML) 0.083% nebulizer solution USE 1 VIAL VIA NEBULIZER EVERY 4 HOURS AND AS NEEDED FOR WHEEZING OR SHORTNESS OF BREATH 75 mL 1   albuterol (VENTOLIN HFA) 108 (90 Base) MCG/ACT inhaler INHALE 2 PUFFS BY MOUTH EVERY 4 HOURS AS NEEDED FOR WHEEZE OR FOR SHORTNESS OF BREATH 18 Inhaler 1   amitriptyline (ELAVIL) 100 MG tablet TAKE 1 TABLET BY MOUTH EVERYDAY AT BEDTIME 90 tablet 0   amLODipine (NORVASC) 10 MG tablet Take 1 tablet (10 mg total) by mouth daily. 90 tablet 0   Cholecalciferol 1.25 MG (50000 UT) capsule Take 1 capsule (50,000 Units total) by mouth once a week. 12 capsule 1   cloNIDine (CATAPRES - DOSED IN MG/24 HR) 0.3 mg/24hr patch PLACE 1 PATCH ONTO SKIN ONCE WEEKLY 12 patch 1   clotrimazole (MYCELEX) 10 MG troche Take 1 tablet (10 mg total) by mouth 5 (five) times daily. 30 tablet 11   DEXILANT 60 MG capsule TAKE 1 CAPSULE BY MOUTH EVERY DAY 90 capsule 3   dextromethorphan-guaiFENesin (MUCINEX DM) 30-600 MG 12hr tablet Take 1 tablet by mouth 2 (two) times daily.      Diclofenac Sodium 2 % SOLN Place 2 g onto the skin 2 (two) times daily. 112 g 3   dicyclomine (BENTYL) 20 MG tablet TAKE 1 TABLET BY MOUTH 3 TIMES DAILY AS NEEDED FOR SPASMS 90 tablet 1   diltiazem (CARDIZEM LA) 420 MG 24 hr tablet Take 1 tablet (420 mg total) by mouth daily. 90 tablet 0   DULoxetine (CYMBALTA) 30 MG capsule Take 1 capsule (30 mg total) by mouth daily. With 65m for total of 90 mg 30 capsule 1   DULoxetine (CYMBALTA) 60 MG capsule Take 1 capsule (60 mg total) by mouth daily. For total of 90 mg 30 capsule 1   EPINEPHRINE 0.3 mg/0.3 mL IJ SOAJ injection INJECT 0.3 MLS INTO THE MUSCLE ONCE AS NEEDED FOR ANAPHYLAXIS 2 Device 2   furosemide (LASIX) 40 MG tablet TAKE 1 TABLET BY MOUTH TWICE A DAY 180 tablet 0     hydrALAZINE (APRESOLINE) 100 MG  tablet TAKE 1 TABLET (100 MG TOTAL) BY MOUTH 3 (THREE) TIMES DAILY. 270 tablet 1   lidocaine-prilocaine (EMLA) cream Apply 1 application topically. Apply 1 application to skin ever 28 days     montelukast (SINGULAIR) 10 MG tablet TAKE 1 TABLET BY MOUTH ONCE DAILY AT BEDTIME 90 tablet 1   Nebivolol HCl 20 MG TABS Take 2 tablets (40 mg total) by mouth daily. 180 tablet 3   NITROSTAT 0.4 MG SL tablet PLACE 1 TABLET UNDER TONGUE EVERY 5 MINUTES AS NEEDED FOR CHEST PAIN, MAX OF 3 TABLETS THEN CALL 911 IF PAIN PERSISTS 100 tablet 0   OXYGEN Use CPAP at bedtime     Potassium Chloride ER 20 MEQ TBCR TAKE 1 TABLET BY MOUTH TWICE A DAY 180 tablet 0   primidone (MYSOLINE) 50 MG tablet Take 0.5 tablets (25 mg total) by mouth at bedtime. 45 tablet 1   ranolazine (RANEXA) 500 MG 12 hr tablet TAKE 1 TABLET BY MOUTH TWICE A DAY 180 tablet 2   Respiratory Therapy Supplies (FLUTTER) DEVI Use as directed 1 each 0   rosuvastatin (CRESTOR) 20 MG tablet Take 1 tablet (20 mg total) by mouth daily. 90 tablet 1   Spacer/Aero-Holding Chambers (AEROCHAMBER MV) inhaler Use as instructed 1 each 0   spironolactone (ALDACTONE) 100 MG tablet TAKE 1 TABLET BY MOUTH EVERY DAY 90 tablet 2   Suvorexant (BELSOMRA) 15 MG TABS Take 1 tablet by mouth daily. 30 tablet 5   SYMBICORT 160-4.5 MCG/ACT inhaler INHALE 2 PUFFS INTO LUNGS 2 TIMES DAILY AS DIRECTED 30.6 Inhaler 1   torsemide (DEMADEX) 20 MG tablet Take 1 tablet (20 mg total) by mouth daily. 90 tablet 1   valsartan (DIOVAN) 320 MG tablet TAKE 1 TABLET BY MOUTH EVERY DAY 90 tablet 1   No current facility-administered medications for this visit.     OBJECTIVE:  Young African-American woman in no acute distress  Vitals:   12/09/18 1245  BP: (!) 195/103  Pulse: 98  Resp: 18  Temp: 98.7 F (37.1 C)  SpO2: 100%   Wt Readings from Last 3 Encounters:  12/09/18 200 lb 9.6 oz (91 kg)  11/25/18 198 lb (89.8 kg)  10/22/18 201  lb (91.2 kg)   Body mass index is 32.38 kg/m.    ECOG FS:2 - Symptomatic, <50% confined to bed  Ocular: Sclerae unicteric Ear-nose-throat: Wearing a mask Lymphatic: No cervical or supraclavicular adenopathy Lungs no rales or rhonchi Heart regular rate and rhythm Abd soft, nontender, positive bowel sounds MSK no focal spinal tenderness, no joint edema Neuro: non-focal, well-oriented, appropriate affect Breasts: The right breast is benign.  The left breast is status post lumpectomy and radiation.  There is no evidence of local recurrence.  Both axillae are benign.  LAB RESULTS: Lab Results  Component Value Date   WBC 6.2 12/09/2018   NEUTROABS 3.0 12/09/2018   HGB 13.4 12/09/2018   HCT 40.7 12/09/2018   MCV 87.9 12/09/2018   PLT 203 12/09/2018      Chemistry      Component Value Date/Time   NA 143 12/09/2018 1222   NA 144 04/06/2018 1235   NA 142 03/17/2017 0835   K 3.1 (L) 12/09/2018 1222   K 3.6 03/17/2017 0835   CL 106 12/09/2018 1222   CL 103 09/10/2012 1618   CO2 28 12/09/2018 1222   CO2 27 03/17/2017 0835   BUN 16 12/09/2018 1222   BUN 12 04/06/2018 1235   BUN 10.1 03/17/2017 0835  CREATININE 1.27 (H) 12/09/2018 1222   CREATININE 0.9 03/17/2017 0835      Component Value Date/Time   CALCIUM 9.0 12/09/2018 1222   CALCIUM 9.5 03/17/2017 0835   ALKPHOS 71 12/09/2018 1222   ALKPHOS 77 03/17/2017 0835   AST 24 12/09/2018 1222   AST 17 03/17/2017 0835   ALT 27 12/09/2018 1222   ALT 24 03/17/2017 0835   BILITOT 0.4 12/09/2018 1222   BILITOT 0.50 03/17/2017 0835        STUDIES: No results found.   ASSESSMENT: 43 y.o. [place] woman with history of locally advanced left breast carcinoma initially presenting in March 2010   (1) treated neoadjuvantly with 4 cycles of docetaxel/ cyclophosphamide.    (2) status post left lumpectomy and sentinel lymph node dissection in July 2010 for a ypT2 ypN1, stage IIB invasive ductal carcinoma, grade 3, triple  negative, with MIB-1 of 88%.   (3) status post full axillary dissection and margin clearance in August 2010.   (4) status post 11 doses of weekly paclitaxel completed in December 2010   (5) radiation therapy completed in March 2011.  Now followed with observation alone.   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  This is very favorable.  She is now 10 years out from definitive surgery for breast cancer with no evidence of disease recurrence.  This is very favorable.  She is behind on her mammography and I have gone ahead and entered the appropriate orders for her.  I reviewed her blood pressure medicines.  She is on 6 of them.  She is being worked up for renovascular hypertension.  I encouraged her to be as compliant as she can manage given the high risk of stroke, kidney damage, and heart attack from uncontrolled hypertension  From the point of view of breast cancer she is doing very well.  She will have her mammography sometime in the next week.  She will receive goserelin for her dysfunctional bleeding on a monthly basis as before and she will return to see [place] in a year.  She knows to call for any other issue that may develop before then.  , Erica Dad, MD  12/09/18 1:04 PM Medical Oncology and Hematology [facility] [address] [place], [place] [address] Tel. [phone]    Fax. [phone]  I, Jacqualyn Posey am acting as a Education administrator for Chauncey Cruel, MD.   I, Lurline Del MD, have reviewed the above documentation for accuracy and completeness, and I agree with the above.

## 2018-12-09 ENCOUNTER — Inpatient Hospital Stay (HOSPITAL_BASED_OUTPATIENT_CLINIC_OR_DEPARTMENT_OTHER): Payer: Medicare Other | Admitting: Oncology

## 2018-12-09 ENCOUNTER — Other Ambulatory Visit: Payer: Self-pay

## 2018-12-09 ENCOUNTER — Ambulatory Visit: Payer: Medicare Other | Admitting: Oncology

## 2018-12-09 ENCOUNTER — Inpatient Hospital Stay: Payer: Medicare Other | Attending: Oncology

## 2018-12-09 ENCOUNTER — Inpatient Hospital Stay: Payer: Medicare Other

## 2018-12-09 ENCOUNTER — Telehealth: Payer: Self-pay | Admitting: *Deleted

## 2018-12-09 ENCOUNTER — Other Ambulatory Visit: Payer: Medicare Other

## 2018-12-09 VITALS — BP 195/103 | HR 98 | Temp 98.7°F | Resp 18 | Ht 66.0 in | Wt 200.6 lb

## 2018-12-09 DIAGNOSIS — K219 Gastro-esophageal reflux disease without esophagitis: Secondary | ICD-10-CM | POA: Diagnosis not present

## 2018-12-09 DIAGNOSIS — J45909 Unspecified asthma, uncomplicated: Secondary | ICD-10-CM | POA: Diagnosis not present

## 2018-12-09 DIAGNOSIS — Z79818 Long term (current) use of other agents affecting estrogen receptors and estrogen levels: Secondary | ICD-10-CM | POA: Insufficient documentation

## 2018-12-09 DIAGNOSIS — G4733 Obstructive sleep apnea (adult) (pediatric): Secondary | ICD-10-CM | POA: Insufficient documentation

## 2018-12-09 DIAGNOSIS — Z923 Personal history of irradiation: Secondary | ICD-10-CM | POA: Insufficient documentation

## 2018-12-09 DIAGNOSIS — Z9221 Personal history of antineoplastic chemotherapy: Secondary | ICD-10-CM | POA: Insufficient documentation

## 2018-12-09 DIAGNOSIS — K3184 Gastroparesis: Secondary | ICD-10-CM | POA: Insufficient documentation

## 2018-12-09 DIAGNOSIS — K760 Fatty (change of) liver, not elsewhere classified: Secondary | ICD-10-CM | POA: Insufficient documentation

## 2018-12-09 DIAGNOSIS — C50412 Malignant neoplasm of upper-outer quadrant of left female breast: Secondary | ICD-10-CM | POA: Diagnosis present

## 2018-12-09 DIAGNOSIS — Z171 Estrogen receptor negative status [ER-]: Secondary | ICD-10-CM

## 2018-12-09 DIAGNOSIS — I10 Essential (primary) hypertension: Secondary | ICD-10-CM | POA: Insufficient documentation

## 2018-12-09 DIAGNOSIS — Z79899 Other long term (current) drug therapy: Secondary | ICD-10-CM | POA: Diagnosis not present

## 2018-12-09 LAB — CMP (CANCER CENTER ONLY)
ALT: 27 U/L (ref 0–44)
AST: 24 U/L (ref 15–41)
Albumin: 3.7 g/dL (ref 3.5–5.0)
Alkaline Phosphatase: 71 U/L (ref 38–126)
Anion gap: 9 (ref 5–15)
BUN: 16 mg/dL (ref 6–20)
CO2: 28 mmol/L (ref 22–32)
Calcium: 9 mg/dL (ref 8.9–10.3)
Chloride: 106 mmol/L (ref 98–111)
Creatinine: 1.27 mg/dL — ABNORMAL HIGH (ref 0.44–1.00)
GFR, Est AFR Am: 59 mL/min — ABNORMAL LOW (ref >60)
GFR, Estimated: 51 mL/min — ABNORMAL LOW (ref >60)
Glucose, Bld: 108 mg/dL — ABNORMAL HIGH (ref 70–99)
Potassium: 3.1 mmol/L — ABNORMAL LOW (ref 3.5–5.1)
Sodium: 143 mmol/L (ref 135–145)
Total Bilirubin: 0.4 mg/dL (ref 0.3–1.2)
Total Protein: 7.4 g/dL (ref 6.5–8.1)

## 2018-12-09 LAB — CBC WITH DIFFERENTIAL (CANCER CENTER ONLY)
Abs Immature Granulocytes: 0.01 10*3/uL (ref 0.00–0.07)
Basophils Absolute: 0 10*3/uL (ref 0.0–0.1)
Basophils Relative: 1 %
Eosinophils Absolute: 0.1 10*3/uL (ref 0.0–0.5)
Eosinophils Relative: 2 %
HCT: 40.7 % (ref 36.0–46.0)
Hemoglobin: 13.4 g/dL (ref 12.0–15.0)
Immature Granulocytes: 0 %
Lymphocytes Relative: 40 %
Lymphs Abs: 2.5 10*3/uL (ref 0.7–4.0)
MCH: 28.9 pg (ref 26.0–34.0)
MCHC: 32.9 g/dL (ref 30.0–36.0)
MCV: 87.9 fL (ref 80.0–100.0)
Monocytes Absolute: 0.5 10*3/uL (ref 0.1–1.0)
Monocytes Relative: 8 %
Neutro Abs: 3 10*3/uL (ref 1.7–7.7)
Neutrophils Relative %: 49 %
Platelet Count: 203 10*3/uL (ref 150–400)
RBC: 4.63 MIL/uL (ref 3.87–5.11)
RDW: 13.3 % (ref 11.5–15.5)
WBC Count: 6.2 10*3/uL (ref 4.0–10.5)
nRBC: 0 % (ref 0.0–0.2)

## 2018-12-09 MED ORDER — GOSERELIN ACETATE 3.6 MG ~~LOC~~ IMPL
3.6000 mg | DRUG_IMPLANT | SUBCUTANEOUS | Status: DC
  Administered 2018-12-09: 3.6 mg via SUBCUTANEOUS

## 2018-12-09 MED ORDER — GOSERELIN ACETATE 3.6 MG ~~LOC~~ IMPL
DRUG_IMPLANT | SUBCUTANEOUS | Status: AC
  Filled 2018-12-09: qty 3.6

## 2018-12-09 NOTE — Telephone Encounter (Signed)
Per MD - ok to give goserelin today.

## 2018-12-10 ENCOUNTER — Encounter: Payer: Self-pay | Admitting: Vascular Surgery

## 2018-12-10 ENCOUNTER — Telehealth: Payer: Self-pay | Admitting: Oncology

## 2018-12-10 NOTE — Telephone Encounter (Signed)
I could not reach patient regarding schedule  °

## 2018-12-17 ENCOUNTER — Telehealth: Payer: Self-pay | Admitting: Vascular Surgery

## 2018-12-17 NOTE — Telephone Encounter (Signed)
Patient now has [employer] instead of [facility].  According to [facility], patient is now out of network for our services.  She must use [facility] affiliated facilities.  I have not been able to reach the patient.  Thurston Hole., LPN

## 2018-12-18 ENCOUNTER — Inpatient Hospital Stay: Admission: RE | Admit: 2018-12-18 | Payer: Medicare HMO | Source: Ambulatory Visit

## 2018-12-23 ENCOUNTER — Ambulatory Visit: Payer: Medicare HMO | Admitting: Vascular Surgery

## 2018-12-28 ENCOUNTER — Other Ambulatory Visit: Payer: Self-pay | Admitting: Internal Medicine

## 2018-12-31 ENCOUNTER — Other Ambulatory Visit: Payer: Self-pay | Admitting: Neurology

## 2019-01-06 ENCOUNTER — Other Ambulatory Visit: Payer: Self-pay

## 2019-01-06 ENCOUNTER — Other Ambulatory Visit: Payer: Self-pay | Admitting: Internal Medicine

## 2019-01-06 ENCOUNTER — Inpatient Hospital Stay: Payer: Medicare Other | Attending: Oncology

## 2019-01-06 ENCOUNTER — Telehealth: Payer: Self-pay | Admitting: Internal Medicine

## 2019-01-06 VITALS — BP 198/114 | HR 99 | Temp 98.5°F

## 2019-01-06 DIAGNOSIS — C50412 Malignant neoplasm of upper-outer quadrant of left female breast: Secondary | ICD-10-CM | POA: Diagnosis not present

## 2019-01-06 DIAGNOSIS — Z79818 Long term (current) use of other agents affecting estrogen receptors and estrogen levels: Secondary | ICD-10-CM | POA: Diagnosis present

## 2019-01-06 MED ORDER — GOSERELIN ACETATE 3.6 MG ~~LOC~~ IMPL
DRUG_IMPLANT | SUBCUTANEOUS | Status: AC
  Filled 2019-01-06: qty 3.6

## 2019-01-06 MED ORDER — GOSERELIN ACETATE 3.6 MG ~~LOC~~ IMPL
3.6000 mg | DRUG_IMPLANT | SUBCUTANEOUS | Status: DC
  Administered 2019-01-06: 3.6 mg via SUBCUTANEOUS

## 2019-01-06 NOTE — Telephone Encounter (Signed)
Received fax from [facility] stating that the symbicort is not covered  Covered alternatives are advair hfaand breo  Per Dr Melvyn Novas- switch to Advair HFA 115 2 puffs bid   ATC and inform the pt before sending in rx  NA and no option to leave msg  [facility]

## 2019-01-06 NOTE — Patient Instructions (Signed)

## 2019-01-06 NOTE — Progress Notes (Signed)
Per MD okay to give zoladex injection today.

## 2019-01-08 NOTE — Telephone Encounter (Signed)
ATC, NA and no option to leave msg 

## 2019-01-12 NOTE — Telephone Encounter (Signed)
atc pt X2, no answer and no vm available.  Wcb.

## 2019-01-13 ENCOUNTER — Other Ambulatory Visit: Payer: Self-pay | Admitting: Internal Medicine

## 2019-01-18 NOTE — Telephone Encounter (Signed)
ATC, NA no VM 

## 2019-01-19 NOTE — Telephone Encounter (Signed)
Closing per protocol 

## 2019-01-20 MED ORDER — MONTELUKAST SODIUM 10 MG PO TABS: 10.0000 mg | ORAL_TABLET | Freq: Every day | ORAL | 1 refills | Status: DC

## 2019-01-20 NOTE — Telephone Encounter (Signed)
Will need in person eval to justify this med- be sure to bring bottles and med calender Tammy gave her and ok to see np

## 2019-01-25 ENCOUNTER — Encounter: Payer: Self-pay | Admitting: Internal Medicine

## 2019-01-25 ENCOUNTER — Other Ambulatory Visit: Payer: Self-pay

## 2019-01-25 ENCOUNTER — Ambulatory Visit (INDEPENDENT_AMBULATORY_CARE_PROVIDER_SITE_OTHER): Payer: Medicare Other | Admitting: Internal Medicine

## 2019-01-25 DIAGNOSIS — R05 Cough: Secondary | ICD-10-CM | POA: Diagnosis not present

## 2019-01-25 DIAGNOSIS — R058 Other specified cough: Secondary | ICD-10-CM

## 2019-01-25 MED ORDER — AZITHROMYCIN 250 MG PO TABS: ORAL_TABLET | ORAL | 0 refills | Status: DC

## 2019-01-25 MED ORDER — PREDNISONE 10 MG PO TABS: ORAL_TABLET | ORAL | 0 refills | Status: DC

## 2019-01-25 MED ORDER — ACETAMINOPHEN-CODEINE 300-60 MG PO TABS: ORAL_TABLET | ORAL | 0 refills | Status: DC

## 2019-01-25 NOTE — Progress Notes (Signed)
Subjective:   Patient ID: Erica [name], female     DOB: [birth year]    MRN: [id number]    Brief patient profile:  46 yobf with asthma all her life much worse since around 07/2010 referred by Dr [name] to pulmonary clinic in Sept 2012 Hx of Breast Cancer 05/2008    History of Present Illness  12/18/2010 Initial pulmonary office eval on acei and  advair cc dtc asthma x 4 months but baseline = excess saba use "even when better" at least twice daily despite daily advair, worse at hs in fact  used saba 3 x last night.  On chemo /RT for breast ca but last rx was Jan 2012 well before asthma worse.  To er multiple times >  no better with singular added x 3 months. rec Stop Lisinopril Dulera 200 Take 2 puffs first thing in am and then another 2 puffs about 12 hours later.  Work on inhaler technique  Nexium 40 mg   Take 30-60 min before first meal of the day and Pepcid 20 mg one bedtime until return Only use your albuterol as a rescue medication to be used if you can't catch your breath by resting or doing a relaxed purse lip breathing pattern. The less you use it, the better it will work when you need it.  Prednisone 10 mg take  4 each am x 2 days,   2 each am x 2 days,  1 each am x2days and stop    01/03/2011 f/u ov/Erica [name] cc much better off ace and advair but still needing saba twice daily on avg. No  Purulent sputum. rec no change in rx/ rule of 2's reviewed     04/06/2013 NP Follow up and Med Review   We reviewed all her meds and organized them into a med calendar. She does have Medicaid but depends on samples. Did not bring all her meds with her today , says they are at home.  Says her breathing is the same since last ov w/ SOB, Has on /off wheezing, some chest heaviness, prod cough with yellow mucus. Good and bad days. Wears out easily .  Denies f/c/s, nausea, vomiting.  Did not stop singulair as recommended.  rec  Follow med calendar closely and bring to each visit > did not do  We are  referring you for a CPSTwith spirometry before and after> never done   04/18/2014 NP Follow up and Med Review / NP  Patient returns for a two-week follow-up and medication review. We reviewed all her medications organize them into a medication count with patient education It appears that she is taking her medications correctly, however, she has over 30 medications and has had several changes recently. Last visit, her [name] was decreased to the 147mcg says that her breathing was better on the 284mcg Patient had a chest x-ray last visit that showed no acute changes She underwent a CT sinus due to persistent cough , which was positive for sinusitis on the right She was treated with a ten-day course of Avelox, she has one dose left Does feel some better but still has cough . She denies any hemoptysis, orthopnea, PND or leg swelling  Patient has upcoming sleep study. Next week for suspected underlying sleep apnea with snoring and daytime sleepiness rec Refer to ENT for sinusitis and chronic cough > never saw one> f/u sinus CT neg 07/26/14  Increase Dulera 200 2 puffs Twice daily   Follow med calendar closely and  bring to each visit.     08/01/2014 f/u ov/Erica [name] re:  Asthma plus noct "gasping"  Chief Complaint  Patient presents with  . Follow-up    doing well.  no complaints.  not doing well x one year with noct / early  4am with cough/ gasping for air almost always goes to neb and then can't go back to sleep even 30 min later better Daytime in chest tightness resolves  completely while on prednisone last took one month prior to OV  And doesn't have it now but has exact same noct symptoms and overusing saba  Doe x mailbox and back to house out of breath and coughing  >>change dulera 200 to 100   08/30/2014 NP Follow up and Med review : Asthma Patient presents for a one-month follow-up We reviewed all her medications organize them into a medication count with patient education Patient has had  multiple medication changes recently She has been having difficulty with resistant hypertension and visual changes. Recently started on primidone and spironolactone . However, has not started this as of yet That she needs to go pick this up at the pharmacy. Neuro notes reviewed , has been referred to NS . B/p is elevated again at todays visit, discussed follow up with PCP for b/p management.  She tried to decrease her Dulera as recommended from last ov with Dr. Melvyn Novas    however, was unable to and is back on 200 dose .  Says overall her breathing is doing ok  No flare of cough or wheezing  Does have a lot of drainage in throat . Discussed starting on chlortrimeton.  rec Continue Dulera 200 2 puffs Twice daily  , rinse after use  Follow med calendar closely and bring to each visit.  Follow up with Primary MD concerning elevated blood pressure    10/05/2014 f/u ov/Erica [name] re: asthma with component of uacs/ no med calendar  Chief Complaint  Patient presents with  . Follow-up    Here to discuss meds- Dulera not on her drug formulary. Pt states breathing is doing well and denies any new co's today.  turns out using neb at least once every 24 h, esp between 2 and 4 am when wakes up with severe dry cough x months/ pred helps some but never eliminated noct cough rec symbicort 160 Take 2 puffs first thing in am and then another 2 puffs about 12 hours later.  Try taking the tramadol around 2 am to see if helps with the early am cough/ wheeze  See calendar for specific medication instructions and bring it back for each and every office visit  Separating the top medications from the bottom group is fundamental to providing you adequate care going forward.   Please schedule a follow up office visit in 2 weeks, sooner if needed with all medications in 2 separate bags    10/19/2014 f/u ov/Erica [name] re:  Asthma vs uacs/ not using med calendar correclty  Chief Complaint  Patient presents with  . Follow-up     Cough has changed sicnce starting symbicort- prod cough with "dirty" sputum. She states she feels that she can tell when her PM dose is due b/c she starts feeling SOB. She is using proair once per day on average.   not using symbicort 160 correctly  rec Prednisone 10 mg take  4 each am x 2 days,  2 each am x 2 days,  1 each am x 2 days and stop  Bystolic 10  mg take twice daily until return  Take 2 chlortrimeton at bedtime and 2 more 4 hours later  Continue symbicort 160 Take 2 puffs first thing in am and then another 2 puffs about 12 hours later.  Only use your albuterol as rescue  Only use nebulizer if you try proventil first and it fails to work  Care everywhere notes reviewed from neurosurgery [facility] , recent right frontal VP shunt  rec at d/c 01/25/16 rec d/c reglan/ hycodan for cough            07/07/2017  f/u ov/Erica [name] re: chronic cough/ asthma/ no med calendar/ no meds / on ACEi but hasn't started  Chief Complaint  Patient presents with  . Follow-up    voiced no concerns   Dyspnea:  MMRC3 = can't walk 100 yards even at a slow pace at a flat grade s stopping due to sob   Cough: some/ 4 am is better / taking Tyl #4 around 11 am  Sleep: better but still sev times a week wakes up coughing around 4 am (was q noct previously)  SABA use:  2 x daily / neb 2-3 per week  Using afrin though not on med cal rec  I emphasized that nasal steroids have no immediate benefit  If not better >  Prednisone 10 mg take  4 each am x 2 days,   2 each am x 2 days,  1 each am x 2 days and stop  If still not better call [facility] at [id number] for CT sinus  See calendar for specific medication instructions and bring it back for each and every office visit for every healthcare provider you see.  Without it,  you may not receive the best quality medical care that we feel you deserve. Separating the top medications from the bottom group is fundamental to providing you adequate care going forward.  Please  schedule a follow up office visit in 4 weeks, sooner if needed  with all medications /inhalers/ solutions in hand so we can verify exactly what you are taking. This includes all medications from all doctors and over the counters  - they should be separated like your med calendar in two bags:  Maintenance vs as needed    03/06/2018  f/u ov/Erica [name] re: pseudoasthma Chief Complaint  Patient presents with  . Follow-up    Increased wheezing "when it got really cold".  She is using her albuterol inhaler and neb both a few x per wk.   Dyspnea:  MMRC3 = can't walk 100 yards even at a slow pace at a flat grade s stopping due to sob   Cough: dry cough on acei Sleeping: 30 degrees on cpap SABA use: a few times a week 02: just cpap   Overt hb despite dexilant  rec Stop lisinopril and asmanex  Start diovan 160 mg at bedtime  Clotrimazole troche up to 5 x daily and use spacer for your symbicort but delay the breath after you trigger    06/05/2018  f/u ov/Erica [name] re: psueudoasthma/ hbp/nonadherent with severe polypharmacy/ not using med calendar  Chief Complaint  Patient presents with  . Follow-up    Breathing has been worse over the past 2 wks. She is using her ventolin 8 x per day and neb about 3 x per day.   Baseline  Few times a week saba/ not able to use cpap more than an hour wakes gagging  With over HB dr Hilarie Fredrickson added Ranitidine helps hb but  not gagging did not improve then started 2 weeks prior to OV   stuffy nose/ cough with sltly brownish secretions sneezing / eyes running more need for albuterol but not helping  tyl #4 was being used 4 x weekly but not on list / now on bid and helps but never takes at hs   Dyspnea:  Ok at rest / across the room   Sleeping: poorly, not using tyl #4 or cpap SABA use: way over using now  rec zpak  Prednisone 10 mg take  4 each am x 2 days,   2 each am x 2 days,  1 each am x 2 days and stop  For cough > mucinex dm 1200 mg twice daily and supplement with tylenol #4   Every 4 hours and use the flutter valve  Bystolic 20 mg now and daily  Tylenol #4 at bedtime to see if helps cpap See Tammy NP w/in  2 weeks with all your medications and inhalers and pill organizers, even over the counter meds, separated in two separate bags  Without this process, it simply isn't possible to assure that we are providing  your outpatient care  with  the attention to detail we feel you deserve.   If we cannot assure that you're getting that kind of care,  then we cannot manage your problem effectively from this clinic. Once you have seen Tammy and we are sure that we're all on the same page with your medication use she will arrange follow up with me.     01/25/2019 acute extended ov/Erica [name] re: recurrent cough x 2.5 weeks  Chief Complaint  Patient presents with  . Acute Visit    increased cough and some wheezing- relates to weather change- using albuterol inhaler 2 x per day on average and neb about once per wk.   dyspnea:  Ok if not coughing Cough:  Assoc with subjective wheeze/ sporadic, tan mucus  SABA :  Last neb one day prior,  None on day of ov Sleeping on cpap at 60 degrees chronically ( even before acute symptoms above) No prednisone in months  02  "they are working on getting g it for me to use with cpap" Worse hb since cough    No obvious day to day or daytime variability or assoc  mucus plugs or hemoptysis or cp or chest tightness,  Or  overt sinus   symptoms.   sleeping without nocturnal  or early am exacerbation  of respiratory  c/o's or need for noct saba. Also denies any obvious fluctuation of symptoms with weather or environmental changes or other aggravating or alleviating factors except as outlined above   No unusual exposure hx or h/o childhood pna/ asthma or knowledge of premature birth.  Current Allergies, Complete Past Medical History, Past Surgical History, Family History, and Social History were reviewed in [employer]  record.  ROS  The following are not active complaints unless bolded Hoarseness, sore throat, dysphagia, dental problems, itching, sneezing,  nasal congestion or discharge of excess mucus or purulent secretions, ear ache,   fever, chills, sweats, unintended wt loss or wt gain, classically pleuritic or exertional cp,  orthopnea pnd or arm/hand swelling  or leg swelling, presyncope, palpitations, abdominal pain, anorexia, nausea, vomiting, diarrhea  or change in bowel habits or change in bladder habits, change in stools or change in urine, dysuria, hematuria,  rash, arthralgias, visual complaints, headache, numbness, weakness or ataxia or problems with walking or coordination,  change in mood or  memory.        Current Meds  Medication Sig  . acetaZOLAMIDE (DIAMOX) 250 MG tablet TAKE 4 TABLETS BY MOUTH 3 (THREE) TIMES DAILY.  Marland Kitchen albuterol (PROVENTIL) (2.5 MG/3ML) 0.083% nebulizer solution USE 1 VIAL VIA NEBULIZER EVERY 4 HOURS AND AS NEEDED FOR WHEEZING OR SHORTNESS OF BREATH  . albuterol (VENTOLIN HFA) 108 (90 Base) MCG/ACT inhaler INHALE 2 PUFFS BY MOUTH EVERY 4 HOURS AS NEEDED FOR WHEEZE OR FOR SHORTNESS OF BREATH  . amitriptyline (ELAVIL) 100 MG tablet TAKE 1 TABLET BY MOUTH EVERYDAY AT BEDTIME  . BIOTIN PO Take 1 tablet by mouth daily.  . Cholecalciferol 1.25 MG (50000 UT) capsule Take 1 capsule (50,000 Units total) by mouth once a week.  . cloNIDine (CATAPRES - DOSED IN MG/24 HR) 0.3 mg/24hr patch PLACE 1 PATCH ONTO SKIN ONCE WEEKLY  . clotrimazole (MYCELEX) 10 MG troche Take 1 tablet (10 mg total) by mouth 5 (five) times daily.  Marland Kitchen DEXILANT 60 MG capsule TAKE 1 CAPSULE BY MOUTH EVERY DAY  . dextromethorphan-guaiFENesin (MUCINEX DM) 30-600 MG 12hr tablet Take 1 tablet by mouth 2 (two) times daily.   . Diclofenac Sodium 2 % SOLN Place 2 g onto the skin 2 (two) times daily.  Marland Kitchen diltiazem (CARDIZEM LA) 420 MG 24 hr tablet Take 1 tablet (420 mg total) by mouth daily.  . DULoxetine (CYMBALTA) 30 MG  capsule Take 1 capsule (30 mg total) by mouth daily. With 60mg  for total of 90 mg  . DULoxetine (CYMBALTA) 60 MG capsule Take 1 capsule (60 mg total) by mouth daily. For total of 90 mg  . EPINEPHRINE 0.3 mg/0.3 mL IJ SOAJ injection INJECT 0.3 MLS INTO THE MUSCLE ONCE AS NEEDED FOR ANAPHYLAXIS  . hydrALAZINE (APRESOLINE) 100 MG tablet TAKE 1 TABLET (100 MG TOTAL) BY MOUTH 3 (THREE) TIMES DAILY.  Marland Kitchen lidocaine-prilocaine (EMLA) cream Apply 1 application topically. Apply 1 application to skin ever 28 days  . metoCLOPramide (REGLAN) 10 MG tablet Take 10 mg by mouth 4 (four) times daily.  . montelukast (SINGULAIR) 10 MG tablet Take 1 tablet (10 mg total) by mouth at bedtime.  . Nebivolol HCl 20 MG TABS Take 2 tablets (40 mg total) by mouth daily.  Marland Kitchen NITROSTAT 0.4 MG SL tablet PLACE 1 TABLET UNDER TONGUE EVERY 5 MINUTES AS NEEDED FOR CHEST PAIN, MAX OF 3 TABLETS THEN CALL 911 IF PAIN PERSISTS  . OXYGEN Use CPAP at bedtime  . oxymetazoline (MUCINEX SINUS-MAX FULL FORCE) 0.05 % nasal spray Place 1 spray into both nostrils 2 (two) times daily as needed for congestion.  . Potassium Chloride ER 20 MEQ TBCR TAKE 1 TABLET BY MOUTH TWICE A DAY  . primidone (MYSOLINE) 50 MG tablet TAKE ONE-HALF TABLET (25 MG TOTAL) BY MOUTH AT BEDTIME.  . ranitidine (ZANTAC) 150 MG capsule Take 150 mg by mouth 2 (two) times daily.  . ranolazine (RANEXA) 500 MG 12 hr tablet TAKE 1 TABLET BY MOUTH TWICE A DAY  . Respiratory Therapy Supplies (FLUTTER) DEVI Use as directed  . rosuvastatin (CRESTOR) 20 MG tablet Take 1 tablet (20 mg total) by mouth daily.  Marland Kitchen Spacer/Aero-Holding Chambers (AEROCHAMBER MV) inhaler Use as instructed  . spironolactone (ALDACTONE) 100 MG tablet TAKE 1 TABLET BY MOUTH EVERY DAY  . SYMBICORT 160-4.5 MCG/ACT inhaler INHALE 2 PUFFS INTO LUNGS 2 TIMES DAILY AS DIRECTED  . torsemide (DEMADEX) 20 MG tablet Take 1 tablet (20 mg total) by mouth daily.  . valsartan (  DIOVAN) 320 MG tablet TAKE 1 TABLET BY MOUTH EVERY  DAY                   Objective:   Physical Exam       amb slt hoarse obese  bf nad  Vital signs reviewed - Note on arrival 02 sats  100% on RA        01/25/2019   202 Wt 213  12/18/10 > 01/03/2011  214 > 01/31/2011  206 >  09/11/2012 209 >208 6/19 > 210 09/25/2012 > 11/20/2012 211 >208 12/07/12> 01/12/2013 209 > 02/09/2013 210 >207 04/06/2013 > 07/14/2013  213>212 07/28/2013 >204 01/26/2014> 04/04/2014 206  >205 04/18/2014 >  07/18/2014 201 >  08/01/14 201 >198 08/30/2014 > 10/05/14  195 > 10/19/2014 192 > 191 11/08/2014 > 02/10/2015  198 > 03/24/2015   198 > 06/29/2015   201 > 08/31/2015  204 > 11/17/2015  197 > 12/15/2015  188 > 03/22/2016   188 > 07/07/2017  198 > 03/06/2018  199  > 06/05/2018  200      HEENT : pt wearing mask not removed for exam due to covid -19 concerns.    NECK :  without JVD/Nodes/TM/ nl carotid upstrokes bilaterally   LUNGS: no acc muscle use,  Nl contour chest which is clear to A and P bilaterally with  cough on insp  maneuvers   CV:  RRR  no s3 or murmur or increase in P2, and no edema   ABD: obese soft and nontender with nl inspiratory excursion in the supine position. No bruits or organomegaly appreciated, bowel sounds nl  MS:  Nl gait/ ext warm without deformities, calf tenderness, cyanosis or clubbing No obvious joint restrictions   SKIN: warm and dry without lesions    NEURO:  alert, approp, nl sensorium with  no motor or cerebellar deficits apparent.

## 2019-01-25 NOTE — Patient Instructions (Addendum)
  zpak  Prednisone 10 mg take  4 each am x 2 days,   2 each am x 2 days,  1 each am x 2 days and stop   See calendar for specific medication instructions and bring it back for each and every office visit for every healthcare provider you see.  Without it,  you may not receive the best quality medical care that we feel you deserve.  You will note that the calendar groups together  your maintenance  medications that are timed at particular times of the day.  Think of this as your checklist for what your doctor has instructed you to do until your next evaluation to see what benefit  there is  to staying on a consistent group of medications intended to keep you well.  The other group at the bottom is entirely up to you to use as you see fit  for specific symptoms that may arise between visits that require you to treat them on an as needed basis.  Think of this as your action plan or "what if" list.   Separating the top medications from the bottom group is fundamental to providing you adequate care going forward.    Please schedule a follow up visit in 3 months but call sooner if needed

## 2019-01-26 ENCOUNTER — Encounter: Payer: Self-pay | Admitting: Internal Medicine

## 2019-01-26 NOTE — Assessment & Plan Note (Signed)
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 Uri/ poorly controlled GERD - flare mid Oct 2020 with uri / assoc secondary gerd  Of the three most common causes of  Sub-acute / recurrent or chronic cough, only one (GERD)  can actually contribute to/ trigger  the other two (asthma and post nasal drip syndrome)  and perpetuate the cylce of cough.  While not intuitively obvious, many patients with chronic low grade reflux do not cough until there is a primary insult that disturbs the protective epithelial barrier and exposes sensitive nerve endings.   This is typically viral but can due to PNDS and  either may apply here.   The point is that once this occurs, it is difficult to eliminate the cycle  using anything but a maximally effective acid suppression regimen at least in the short run, accompanied by an appropriate diet to address non acid GERD and control / eliminate the cough itself for at least 3 days with Tylenol #4  Also added 6 days of Prednisone in case of component of Th-2 driven upper or lower airways inflammation (if cough responds short term only to relapse befor return while will on rx for uacs that would point to allergic rhinitis/ asthma or eos bronchitis)    Also needs to use flutter valve on high resistance to reduce airway trauma from cough and still bring up mucus effectively / demonstrated for pt    Added also  zpak for ? [place] with ? early bronchitis    I had an extended discussion with the patient reviewing all relevant studies completed to date and  lasting 15 to 20 minutes of a 25 minute visit    I performed device teaching (flutter valve)   using a teach back technique which also  extended face to face time for this visit (see  above)   Each maintenance medication was reviewed in detail including most importantly the difference between maintenance and prns and under what circumstances the prns are to be triggered using an action plan format that is not reflected in the computer generated alphabetically organized AVS but trather by a customized med calendar that reflects the AVS meds with confirmed 100% correlation.   In addition, Please see AVS for unique instructions that I personally wrote and verbalized to the the pt in detail and then reviewed with pt  by my nurse highlighting any  changes in therapy recommended at today's visit to their plan of care.

## 2019-01-27 ENCOUNTER — Encounter: Payer: Self-pay | Admitting: Internal Medicine

## 2019-01-27 ENCOUNTER — Ambulatory Visit (INDEPENDENT_AMBULATORY_CARE_PROVIDER_SITE_OTHER): Payer: Medicare Other | Admitting: Internal Medicine

## 2019-01-27 ENCOUNTER — Other Ambulatory Visit: Payer: Self-pay

## 2019-01-27 VITALS — BP 200/90 | HR 116 | Temp 98.5°F | Ht 64.25 in | Wt 203.5 lb

## 2019-01-27 DIAGNOSIS — K219 Gastro-esophageal reflux disease without esophagitis: Secondary | ICD-10-CM | POA: Diagnosis not present

## 2019-01-27 DIAGNOSIS — K3184 Gastroparesis: Secondary | ICD-10-CM | POA: Diagnosis not present

## 2019-01-27 DIAGNOSIS — Z1211 Encounter for screening for malignant neoplasm of colon: Secondary | ICD-10-CM | POA: Diagnosis not present

## 2019-01-27 MED ORDER — FAMOTIDINE 20 MG PO TABS: 20.0000 mg | ORAL_TABLET | Freq: Every day | ORAL | 1 refills | Status: DC

## 2019-01-27 MED ORDER — METOCLOPRAMIDE HCL 10 MG PO TABS: 10.0000 mg | ORAL_TABLET | Freq: Four times a day (QID) | ORAL | 2 refills | Status: DC

## 2019-01-27 MED ORDER — DEXILANT 60 MG PO CPDR: 60.0000 mg | DELAYED_RELEASE_CAPSULE | Freq: Every day | ORAL | 3 refills | Status: DC

## 2019-01-27 MED ORDER — PROMETHAZINE HCL 25 MG PO TABS: 25.0000 mg | ORAL_TABLET | Freq: Three times a day (TID) | ORAL | 1 refills | Status: DC | PRN

## 2019-01-27 NOTE — Patient Instructions (Addendum)
We have sent the following medications to your pharmacy for you to pick up at your convenience: Dexilant 60 mg daily Famotidine 20-40 mg every night  Continue Reglan.  Continue promethazine.  Please follow up with Dr Hilarie Fredrickson in 1 year in the office.  If you are age 45 or older, your body mass index should be between 23-30. Your Body mass index is 33.94 kg/m. If this is out of the aforementioned range listed, please consider follow up with your Primary Care Provider.  If you are age 52 or younger, your body mass index should be between 19-25. Your Body mass index is 33.94 kg/m. If this is out of the aformentioned range listed, please consider follow up with your Primary Care Provider.

## 2019-01-27 NOTE — Progress Notes (Signed)
Subjective:    Patient ID: Erica [name], female    DOB: April 21, 1975, 43 y.o.   MRN: [id number]  HPI [name] is a 43 year old female with a history of GERD, significant gastroparesis, severe hypertension, pseudotumor cerebri status post VP shunt, history of breast cancer with history of chemotherapy and radiation, peripheral neuropathy, obesity who is here for follow-up.  She is here with her 47-year-old son today and was last seen in the office on 03/13/2018 and then for upper endoscopy on 03/27/2018.  This upper endoscopy was normal.  Biopsies were negative for H. pylori or significant inflammation.  She reports that she has been having some right-sided abdominal pain in the midaxillary line.  This is worse with coughing and she feels like she may have pulled a muscle.  Her acid reflux has been significant at night and it can wake her up burning in her chest and even up into her nose.  She was on ranitidine but this is now off the market.  She is taking Dexilant 60 mg in the morning.  She uses metoclopramide 10 mg when she eats but always at bedtime.  She has significant waves of nausea and at times vomiting.  Last week she had 12 hours of near constant vomiting.  She has Phenergan suppositories which she only uses if the oral promethazine does not provide some relief.  Zofran previously was ineffective.   Review of Systems As per HPI, otherwise negative  Current Medications, Allergies, Past Medical History, Past Surgical History, Family History and Social History were reviewed in [employer] record.     Objective:   Physical Exam BP (!) 200/90 (BP Location: Right Arm, Patient Position: Sitting, Cuff Size: Normal)   Pulse (!) 116   Temp 98.5 F (36.9 C)   Ht 5' 4.25" (1.632 m) Comment: height measured without shoes  Wt 203 lb 8 oz (92.3 kg)   BMI 34.66 kg/m  Gen: awake, alert, NAD HEENT: anicteric CV: RRR, no mrg Pulm: Scattered rhonchi bilaterally with  end expiratory wheeze Abd: soft, obese, NT/ND, +BS throughout Ext: no c/c/e Neuro: nonfocal  CBC    Component Value Date/Time   WBC 6.2 12/09/2018 1222   WBC 6.6 10/01/2018 1115   RBC 4.63 12/09/2018 1222   HGB 13.4 12/09/2018 1222   HGB 13.8 03/17/2017 0835   HCT 40.7 12/09/2018 1222   HCT 41.8 03/17/2017 0835   PLT 203 12/09/2018 1222   PLT 191 03/17/2017 0835   MCV 87.9 12/09/2018 1222   MCV 88.0 03/17/2017 0835   MCH 28.9 12/09/2018 1222   MCHC 32.9 12/09/2018 1222   RDW 13.3 12/09/2018 1222   RDW 14.5 03/17/2017 0835   LYMPHSABS 2.5 12/09/2018 1222   LYMPHSABS 1.6 03/17/2017 0835   MONOABS 0.5 12/09/2018 1222   MONOABS 0.3 03/17/2017 0835   EOSABS 0.1 12/09/2018 1222   EOSABS 0.1 03/17/2017 0835   BASOSABS 0.0 12/09/2018 1222   BASOSABS 0.1 03/17/2017 0835   CMP     Component Value Date/Time   NA 143 12/09/2018 1222   NA 144 04/06/2018 1235   NA 142 03/17/2017 0835   K 3.1 (L) 12/09/2018 1222   K 3.6 03/17/2017 0835   CL 106 12/09/2018 1222   CL 103 09/10/2012 1618   CO2 28 12/09/2018 1222   CO2 27 03/17/2017 0835   GLUCOSE 108 (H) 12/09/2018 1222   GLUCOSE 114 03/17/2017 0835   GLUCOSE 86 09/10/2012 1618   BUN 16 12/09/2018 1222  BUN 12 04/06/2018 1235   BUN 10.1 03/17/2017 0835   CREATININE 1.27 (H) 12/09/2018 1222   CREATININE 0.9 03/17/2017 0835   CALCIUM 9.0 12/09/2018 1222   CALCIUM 9.5 03/17/2017 0835   PROT 7.4 12/09/2018 1222   PROT 7.7 03/17/2017 0835   ALBUMIN 3.7 12/09/2018 1222   ALBUMIN 3.7 03/17/2017 0835   AST 24 12/09/2018 1222   AST 17 03/17/2017 0835   ALT 27 12/09/2018 1222   ALT 24 03/17/2017 0835   ALKPHOS 71 12/09/2018 1222   ALKPHOS 77 03/17/2017 0835   BILITOT 0.4 12/09/2018 1222   BILITOT 0.50 03/17/2017 0835   GFRNONAA 51 (L) 12/09/2018 1222   GFRAA 59 (L) 12/09/2018 1222         Assessment & Plan:  43 year old female with a history of GERD, significant gastroparesis, severe hypertension, pseudotumor cerebri  status post VP shunt, history of breast cancer with history of chemotherapy and radiation, peripheral neuropathy, obesity who is here for follow-up.   1.  GERD --symptoms uncontrolled at night despite Dexilant 60 mg daily.  Reassuring upper endoscopy in December 2019.  We will add back H2 blocker at night with famotidine given that ranitidine remains off the market. --Dexilant 60 mg daily, 30 minutes before breakfast --Add famotidine 20 to 40 mg nightly this can be used as needed  2.  Gastroparesis --significant issue for her requiring the use of metoclopramide.  We reviewed the potential for long-term side effects including tardive dyskinesia.  She feels this therapy is essential.  I recommended skipping doses and drug holidays when possible.  She can continue 5 to 10 mg before meals and at bedtime.  Follow a gastroparesis diet.  Can use oral promethazine 12.5 to 25 mg every 8 hours as needed and Phenergan suppository in place of the oral promethazine if intractable vomiting.  3. CRC screening --she is due colon cancer screening at this time based on her age of 66 years.  I failed to discuss this with her in clinic today but will have my office reach out and let her know that colonoscopy is recommended for screening.  25 minutes spent with the patient today. Greater than 50% was spent in counseling and coordination of care with the patient

## 2019-01-28 ENCOUNTER — Telehealth: Payer: Self-pay | Admitting: *Deleted

## 2019-01-28 DIAGNOSIS — Z1159 Encounter for screening for other viral diseases: Secondary | ICD-10-CM

## 2019-01-28 DIAGNOSIS — Z1211 Encounter for screening for malignant neoplasm of colon: Secondary | ICD-10-CM

## 2019-01-28 MED ORDER — SUPREP BOWEL PREP KIT 17.5-3.13-1.6 GM/177ML PO SOLN: 1.0000 | ORAL | 0 refills | Status: DC

## 2019-01-28 NOTE — Telephone Encounter (Signed)
I have spoken to patient who is agreeable to having screening colonoscopy. She has scheduled for 02/03/2019 at [facility] and has scheduled COVID screen on 01/29/2019 at 11am. She will come to our office and I will give her written instructions on 01/29/2019.

## 2019-01-28 NOTE — Telephone Encounter (Signed)
-----   Message from Jerene Bears, MD sent at 01/27/2019  6:14 PM EDT ----- She is due colon for screening.  I forgot to tell her this.  Please reach out to see if she is agreeable to proceed with screening colonoscopy based on age of 31 years

## 2019-02-03 ENCOUNTER — Other Ambulatory Visit: Payer: Self-pay

## 2019-02-03 ENCOUNTER — Inpatient Hospital Stay: Payer: Medicare Other | Attending: Oncology

## 2019-02-03 ENCOUNTER — Encounter: Payer: Medicare Other | Admitting: Internal Medicine

## 2019-02-03 VITALS — BP 181/88 | HR 104 | Temp 98.7°F | Resp 20

## 2019-02-03 DIAGNOSIS — Z79818 Long term (current) use of other agents affecting estrogen receptors and estrogen levels: Secondary | ICD-10-CM | POA: Insufficient documentation

## 2019-02-03 DIAGNOSIS — C50412 Malignant neoplasm of upper-outer quadrant of left female breast: Secondary | ICD-10-CM | POA: Diagnosis present

## 2019-02-03 MED ORDER — GOSERELIN ACETATE 3.6 MG ~~LOC~~ IMPL
3.6000 mg | DRUG_IMPLANT | SUBCUTANEOUS | Status: DC
  Administered 2019-02-03: 14:00:00 3.6 mg via SUBCUTANEOUS

## 2019-02-03 MED ORDER — GOSERELIN ACETATE 3.6 MG ~~LOC~~ IMPL
DRUG_IMPLANT | SUBCUTANEOUS | Status: AC
  Filled 2019-02-03: qty 3.6

## 2019-02-03 NOTE — Patient Instructions (Signed)

## 2019-03-03 ENCOUNTER — Other Ambulatory Visit: Payer: Self-pay

## 2019-03-03 ENCOUNTER — Telehealth: Payer: Self-pay

## 2019-03-03 ENCOUNTER — Inpatient Hospital Stay: Payer: Medicare Other | Attending: Oncology

## 2019-03-03 VITALS — BP 185/98 | HR 105 | Temp 98.2°F | Resp 20

## 2019-03-03 DIAGNOSIS — C50412 Malignant neoplasm of upper-outer quadrant of left female breast: Secondary | ICD-10-CM | POA: Diagnosis present

## 2019-03-03 DIAGNOSIS — Z79818 Long term (current) use of other agents affecting estrogen receptors and estrogen levels: Secondary | ICD-10-CM | POA: Diagnosis present

## 2019-03-03 MED ORDER — GOSERELIN ACETATE 3.6 MG ~~LOC~~ IMPL
3.6000 mg | DRUG_IMPLANT | Freq: Once | SUBCUTANEOUS | Status: AC
  Administered 2019-03-03: 3.6 mg via SUBCUTANEOUS

## 2019-03-03 MED ORDER — GOSERELIN ACETATE 3.6 MG ~~LOC~~ IMPL
DRUG_IMPLANT | SUBCUTANEOUS | Status: AC
  Filled 2019-03-03: qty 3.6

## 2019-03-03 NOTE — Telephone Encounter (Signed)
Per Dr. [name] OK to give Pt injection with 185/98 BP

## 2019-03-03 NOTE — Patient Instructions (Signed)

## 2019-03-04 ENCOUNTER — Encounter: Payer: Self-pay | Admitting: Family Medicine

## 2019-03-08 ENCOUNTER — Other Ambulatory Visit: Payer: Self-pay

## 2019-03-08 ENCOUNTER — Ambulatory Visit (AMBULATORY_SURGERY_CENTER): Payer: Self-pay

## 2019-03-08 ENCOUNTER — Telehealth: Payer: Self-pay

## 2019-03-08 VITALS — Temp 97.1°F | Ht 64.25 in | Wt 206.4 lb

## 2019-03-08 DIAGNOSIS — Z1211 Encounter for screening for malignant neoplasm of colon: Secondary | ICD-10-CM

## 2019-03-08 NOTE — Telephone Encounter (Signed)
Dr. Hilarie Fredrickson and Jenny Reichmann,  I saw this patient today in Pre-Visit. I was aware that she used O2 with her CPAP machine. The patient told me today that her cardiologist states that her heart is functioning at 25 % and he is trying to get an approval to give her O2 during the day or as needed. Marie and I searched her chart and we could not find anything that stated that her heart was functioning at 25 % or that they were trying to get O2 for use other than her CPAP. Please review the chart and let me know if the patient is still ok for [place]. Her procedure is scheduled for 03/22/19. Thanks!   Riki Sheer, LPN ( [facility] )

## 2019-03-08 NOTE — Telephone Encounter (Signed)
EF 30-44% and 41% by nuclear With wide range, I recommend we move test to outpatient hospital setting, nonurgent for screening This can be scheduled during an outpt procedure day at [facility] in the coming months Thanks

## 2019-03-08 NOTE — Progress Notes (Signed)
Denies allergies to eggs or soy products. Denies complication of anesthesia or sedation. Denies use of weight loss medication. Denies use of O2.   Emmi instructions given for colonoscopy. Covid screening is scheduled for 03/18/19 @ 11:00 Am. Patient states that her cardiologist is trying to get approval for her to be on O2 more than often than just in her CPAP. A note will be sent to Osvaldo Angst and Dr. Hilarie Fredrickson to verify that she is ok for [place].   Riki Sheer, LPN ( [place] )

## 2019-03-09 ENCOUNTER — Encounter: Payer: Self-pay | Admitting: Internal Medicine

## 2019-03-09 NOTE — Telephone Encounter (Signed)
I have spoken to patient to advise of new recommendations by Dr Hilarie Fredrickson. Patient verbalizes understanding. I have cancelled [place] colonoscopy and [facility] COVID screen. I have advised that I will be in touch with her to schedule hospital colonoscopy as a date becomes available.

## 2019-03-10 ENCOUNTER — Encounter: Payer: Self-pay | Admitting: Family Medicine

## 2019-03-10 ENCOUNTER — Ambulatory Visit: Payer: Self-pay

## 2019-03-10 ENCOUNTER — Ambulatory Visit (INDEPENDENT_AMBULATORY_CARE_PROVIDER_SITE_OTHER): Payer: Medicare Other | Admitting: Family Medicine

## 2019-03-10 ENCOUNTER — Other Ambulatory Visit: Payer: Self-pay

## 2019-03-10 VITALS — BP 160/94 | HR 110 | Ht 64.0 in | Wt 206.0 lb

## 2019-03-10 DIAGNOSIS — M25572 Pain in left ankle and joints of left foot: Secondary | ICD-10-CM | POA: Diagnosis not present

## 2019-03-10 DIAGNOSIS — M76822 Posterior tibial tendinitis, left leg: Secondary | ICD-10-CM

## 2019-03-10 DIAGNOSIS — G8929 Other chronic pain: Secondary | ICD-10-CM | POA: Diagnosis not present

## 2019-03-10 DIAGNOSIS — C50412 Malignant neoplasm of upper-outer quadrant of left female breast: Secondary | ICD-10-CM | POA: Diagnosis not present

## 2019-03-10 NOTE — Assessment & Plan Note (Signed)
Patient given injection today, CAM Walker given today.  Discussed icing regimen and home exercises.  On ultrasound no significant arthritic changes of the joint noted.  Patient should do fairly well I am hoping.  Follow-up again in 3 to 4 weeks

## 2019-03-10 NOTE — Progress Notes (Signed)
[facility] [address] [place], [place] [address] Phone: [phone] Subjective:   I Erica [name] am serving as a Education administrator for Dr. Hulan Saas.   CC: Left ankle pain  QIO:[id number]   07/15/2018 Posterior tibialis tendinitis.  Discussed icing regimen and home exercise.  Discussed which activities of doing which was to avoid.  Discussed heel lift, over-the-counter orthotics, topical anti-inflammatories prescribed.  Hopefully see patient in the office in 4 weeks.  03/10/2019 Erica [name] is a 43 y.o. female coming in with complaint of left ankle pain. Patient states she is feeling horrible today.  Patient states that it is fairly severe at the moment.  Patient denies any true swelling.  States that unfortunately now the contralateral side starts giving her more discomfort.  Has been doing the exercises occasionally but did not ever get the new footwear.     Past Medical History:  Diagnosis Date  . Allergy   . Anxiety   . Arthritis   . Asthma   . Blood transfusion without reported diagnosis   . Breast cancer ([place]) 2010   a. locally advanced left breast carcinoma diagnosed in 2010 and treated with neoadjuvant chemotherapy with docetaxel and cyclophosphamide as well as paclitaxel. This was followed by radiation therapy which was completed in 2011.  Marland Kitchen CHF (congestive heart failure) ([place])   . Chronic kidney disease   . Dyspnea on exertion    a. 01/2013 Lexi MV: EF 54%, no ischemia/infarct;  b. 05/2013 Echo: EF 60-65%, no rwma, Gr 2 DD;  b.   . Fatty liver   . Gastroparesis   . GERD (gastroesophageal reflux disease)   . Hypertension   . Impaired glucose tolerance 04/13/2011  . Lymphadenitis, chronic    restricted LEFT extremity  . 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 OSA (obstructive sleep apnea) 05/04/2014   AHI 31/hr now on CPAP at 12cm H2O  . Oxygen deficiency    Uses 02 in CPAP  . Personal history of chemotherapy 2010  . Personal history of  radiation therapy 2010  . Sleep apnea    Past Surgical History:  Procedure Laterality Date  . BRAIN SURGERY     Shunt  . BREAST LUMPECTOMY Left 09/2008  . CARPAL TUNNEL RELEASE Left 2011  . LYMPH NODE DISSECTION Left 2010   breast; 2 wks after breast lumpectomy  . TENDON RELEASE Right 2012   "hand"  . TUBAL LIGATION  05/11/2011   Procedure: POST PARTUM TUBAL LIGATION;  Surgeon: Emeterio Reeve, MD;  Location: [facility];  Service: Gynecology;  Laterality: Bilateral;  Induced for HTN   Social History   Socioeconomic History  . Marital status: Single    Spouse name: Not on file  . Number of children: 3  . Years of education: Not on file  . Highest education level: Not on file  Occupational History    Employer: UNEMPLOYED  Social Needs  . Financial resource strain: Not on file  . Food insecurity    Worry: Not on file    Inability: Not on file  . Transportation needs    Medical: Not on file    Non-medical: Not on file  Tobacco Use  . Smoking status: Never Smoker  . Smokeless tobacco: Never Used  Substance and Sexual Activity  . Alcohol use: Yes    Alcohol/week: 0.0 standard drinks    Comment: occasionally/socially  . Drug use: No  . Sexual activity: Yes    Birth control/protection:  Surgical  Lifestyle  . Physical activity    Days per week: Not on file    Minutes per session: Not on file  . Stress: Not on file  Relationships  . Social Herbalist on phone: Not on file    Gets together: Not on file    Attends religious service: Not on file    Active member of club or organization: Not on file    Attends meetings of clubs or organizations: Not on file    Relationship status: Not on file  Other Topics Concern  . Not on file  Social History Narrative   She lives with four children.   She is currently not working (disbaility pending).      Allergies  Allergen Reactions  . Aspirin Shortness Of Breath and Palpitations    Pt can take ibuprofen without reaction  .  Doxycycline Anaphylaxis  . Kiwi Extract Itching and Swelling    Lip swelling  . Metoprolol Hives and Itching  . Penicillins Shortness Of Breath and Palpitations    Has patient had a PCN reaction causing immediate rash, facial/tongue/throat swelling, SOB or lightheadedness with hypotension: Yes Has patient had a PCN reaction causing severe rash involving mucus membranes or skin necrosis: Yes Has patient had a PCN reaction that required hospitalization Yes Has patient had a PCN reaction occurring within the last 10 years: No If all of the above answers are "NO", then may proceed with Cephalosporin use.  . Strawberry Extract Hives  . Sulfamethoxazole-Trimethoprim Anaphylaxis    Facial, throat and tongue swelling with difficulty swallowing.   Family History  Problem Relation Age of Onset  . Other Mother 34       breast calcifications treated with surgery, breast cancer pill, and radiation  . Fibroids Sister 67       s/p TAH-BSO  . Diabetes Maternal Grandmother   . Heart disease Maternal Grandmother   . Hypertension Maternal Grandmother   . Breast cancer Other        maternal great aunt (MGM's sister)  . Breast cancer Other        paternal great aunt dx middle ages; s/p mastectomy  . Cancer Neg Hx   . Alcohol abuse Neg Hx   . Early death Neg Hx   . Hyperlipidemia Neg Hx   . Kidney disease Neg Hx   . Stroke Neg Hx   . Colon cancer Neg Hx   . Esophageal cancer Neg Hx   . Stomach cancer Neg Hx   . Rectal cancer Neg Hx      Current Outpatient Medications (Cardiovascular):  .  acetaZOLAMIDE (DIAMOX) 250 MG tablet, TAKE 4 TABLETS BY MOUTH 3 (THREE) TIMES DAILY. .  cloNIDine (CATAPRES - DOSED IN MG/24 HR) 0.3 mg/24hr patch, PLACE 1 PATCH ONTO SKIN ONCE WEEKLY .  diltiazem (CARDIZEM LA) 420 MG 24 hr tablet, Take 1 tablet (420 mg total) by mouth daily. Marland Kitchen  EPINEPHRINE 0.3 mg/0.3 mL IJ SOAJ injection, INJECT 0.3 MLS INTO THE MUSCLE ONCE AS NEEDED FOR ANAPHYLAXIS .  hydrALAZINE  (APRESOLINE) 100 MG tablet, TAKE 1 TABLET (100 MG TOTAL) BY MOUTH 3 (THREE) TIMES DAILY. .  Nebivolol HCl 20 MG TABS, Take 2 tablets (40 mg total) by mouth daily. Marland Kitchen  NITROSTAT 0.4 MG SL tablet, PLACE 1 TABLET UNDER TONGUE EVERY 5 MINUTES AS NEEDED FOR CHEST PAIN, MAX OF 3 TABLETS THEN CALL 911 IF PAIN PERSISTS .  ranolazine (RANEXA) 500 MG 12 hr tablet, TAKE  1 TABLET BY MOUTH TWICE A DAY .  rosuvastatin (CRESTOR) 20 MG tablet, Take 1 tablet (20 mg total) by mouth daily. Marland Kitchen  spironolactone (ALDACTONE) 100 MG tablet, TAKE 1 TABLET BY MOUTH EVERY DAY .  torsemide (DEMADEX) 20 MG tablet, Take 1 tablet (20 mg total) by mouth daily. .  valsartan (DIOVAN) 320 MG tablet, TAKE 1 TABLET BY MOUTH EVERY DAY  Current Outpatient Medications (Respiratory):  .  albuterol (PROVENTIL) (2.5 MG/3ML) 0.083% nebulizer solution, USE 1 VIAL VIA NEBULIZER EVERY 4 HOURS AND AS NEEDED FOR WHEEZING OR SHORTNESS OF BREATH .  albuterol (VENTOLIN HFA) 108 (90 Base) MCG/ACT inhaler, INHALE 2 PUFFS BY MOUTH EVERY 4 HOURS AS NEEDED FOR WHEEZE OR FOR SHORTNESS OF BREATH .  dextromethorphan-guaiFENesin (MUCINEX DM) 30-600 MG 12hr tablet, Take 1 tablet by mouth 2 (two) times daily.  .  montelukast (SINGULAIR) 10 MG tablet, Take 1 tablet (10 mg total) by mouth at bedtime. Marland Kitchen  oxymetazoline (MUCINEX SINUS-MAX FULL FORCE) 0.05 % nasal spray, Place 1 spray into both nostrils 2 (two) times daily as needed for congestion. .  promethazine (PHENERGAN) 25 MG tablet, Take 1 tablet (25 mg total) by mouth 3 (three) times daily as needed for nausea or vomiting. .  SYMBICORT 160-4.5 MCG/ACT inhaler, INHALE 2 PUFFS INTO LUNGS 2 TIMES DAILY AS DIRECTED  Current Outpatient Medications (Analgesics):  .  acetaminophen-codeine (TYLENOL #4) 300-60 MG tablet, TAKE 1 TABLET BY MOUTH EVERY 4 (FOUR) HOURS AS NEEDED for cough   Current Outpatient Medications (Other):  .  amitriptyline (ELAVIL) 100 MG tablet, TAKE 1 TABLET BY MOUTH EVERYDAY AT BEDTIME .   BIOTIN PO, Take 1 tablet by mouth daily. .  Cholecalciferol 1.25 MG (50000 UT) capsule, Take 1 capsule (50,000 Units total) by mouth once a week. .  clotrimazole (MYCELEX) 10 MG troche, Take 1 tablet (10 mg total) by mouth 5 (five) times daily. Marland Kitchen  dexlansoprazole (DEXILANT) 60 MG capsule, Take 1 capsule (60 mg total) by mouth daily. .  Diclofenac Sodium 2 % SOLN, Place 2 g onto the skin 2 (two) times daily. .  DULoxetine (CYMBALTA) 30 MG capsule, Take 1 capsule (30 mg total) by mouth daily. With 58m for total of 90 mg .  DULoxetine (CYMBALTA) 60 MG capsule, Take 1 capsule (60 mg total) by mouth daily. For total of 90 mg .  famotidine (PEPCID) 20 MG tablet, Take 1-2 tablets (20-40 mg total) by mouth at bedtime. .  lidocaine-prilocaine (EMLA) cream, Apply 1 application topically. Apply 1 application to skin ever 28 days .  metoCLOPramide (REGLAN) 10 MG tablet, Take 1 tablet (10 mg total) by mouth 4 (four) times daily. .  OXYGEN, Use CPAP at bedtime .  Potassium Chloride ER 20 MEQ TBCR, TAKE 1 TABLET BY MOUTH TWICE A DAY .  primidone (MYSOLINE) 50 MG tablet, TAKE ONE-HALF TABLET (25 MG TOTAL) BY MOUTH AT BEDTIME. .Marland Kitchen Respiratory Therapy Supplies (FLUTTER) DEVI, Use as directed .  Spacer/Aero-Holding Chambers (AEROCHAMBER MV) inhaler, Use as instructed .  SUPREP BOWEL PREP KIT 17.5-3.13-1.6 GM/177ML SOLN, Take 1 kit by mouth as directed. For colonoscopy prep    Past medical history, social, surgical and family history all reviewed in electronic medical record.  No pertanent information unless stated regarding to the chief complaint.   Review of Systems:  No headache, visual changes, nausea, vomiting, diarrhea, constipation, dizziness, abdominal pain, skin rash, fevers, chills, night sweats, weight loss, swollen lymph nodes, body aches, joint swelling,, chest pain, shortness of breath,  mood changes.  Positive muscle aches  Objective  Blood pressure (!) 160/94, pulse (!) 110, height '5\' 4"'  (1.626  m), weight 206 lb (93.4 kg), SpO2 98 %.     General: No apparent distress alert and oriented x3 mood and affect normal, dressed appropriately.  HEENT: Pupils equal, extraocular movements intact  Respiratory: Patient's speak in full sentences and does not appear short of breath  Cardiovascular: No lower extremity edema, non tender, no erythema  Skin: Warm dry intact with no signs of infection or rash on extremities or on axial skeleton.  Abdomen: Soft nontender  Neuro: Cranial nerves II through XII are intact, neurovascularly intact in all extremities with 2+ DTRs and 2+ pulses.  Lymph: No lymphadenopathy of posterior or anterior cervical chain or axillae bilaterally.  Gait normal with good balance and coordination.  MSK:  Non tender with full range of motion and good stability and symmetric strength and tone of shoulders, elbows, wrist, hip, knee and bilaterally.  Left ankle exam shows the patient does have some tightness of the and posterior cord of the ankle.  Patient is some tenderness to palpation no over the posterior tibialis tendon.  Patient negative Thompson test.  Neurovascularly intact distally.  Procedure: Real-time Ultrasound Guided Injection of left posterior tibialis tendon sheath Device: GE Logiq Q7 Ultrasound guided injection is preferred based studies that show increased duration, increased effect, greater accuracy, decreased procedural pain, increased response rate, and decreased cost with ultrasound guided versus blind injection.  Verbal informed consent obtained.  Time-out conducted.  Noted no overlying erythema, induration, or other signs of local infection.  Skin prepped in a sterile fashion.  Local anesthesia: Topical Ethyl chloride.  With sterile technique and under real time ultrasound guidance: With a 25-gauge half inch needle injected with 0.5 cc of 0.5% Marcaine and 0.5 cc of Kenalog 40 mg/mL Completed without difficulty  Pain immediately resolved suggesting  accurate placement of the medication.  Advised to call if fevers/chills, erythema, induration, drainage, or persistent bleeding.  Images permanently stored and available for review in the ultrasound unit.  Impression: Technically successful ultrasound guided injection.   Impression and Recommendations:     This case required medical decision making of moderate complexity. The above documentation has been reviewed and is accurate and complete Lyndal Pulley, DO       Note: This dictation was prepared with Dragon dictation along with smaller phrase technology. Any transcriptional errors that result from this process are unintentional.

## 2019-03-10 NOTE — Patient Instructions (Signed)
  [address], [address]st [address] [place], [place] [address] Phone [phone]  See me again in 5-6 weeks

## 2019-03-20 ENCOUNTER — Other Ambulatory Visit: Payer: Self-pay | Admitting: Internal Medicine

## 2019-03-20 DIAGNOSIS — I10 Essential (primary) hypertension: Secondary | ICD-10-CM

## 2019-03-20 DIAGNOSIS — G932 Benign intracranial hypertension: Secondary | ICD-10-CM

## 2019-03-22 ENCOUNTER — Encounter: Payer: Medicare Other | Admitting: Internal Medicine

## 2019-03-31 ENCOUNTER — Inpatient Hospital Stay: Payer: Medicare Other

## 2019-03-31 ENCOUNTER — Other Ambulatory Visit: Payer: Self-pay

## 2019-03-31 VITALS — BP 160/88 | HR 92 | Temp 98.2°F | Resp 18

## 2019-03-31 DIAGNOSIS — C50412 Malignant neoplasm of upper-outer quadrant of left female breast: Secondary | ICD-10-CM

## 2019-03-31 MED ORDER — GOSERELIN ACETATE 3.6 MG ~~LOC~~ IMPL
3.6000 mg | DRUG_IMPLANT | SUBCUTANEOUS | Status: DC
  Administered 2019-03-31: 3.6 mg via SUBCUTANEOUS

## 2019-03-31 MED ORDER — GOSERELIN ACETATE 3.6 MG ~~LOC~~ IMPL
DRUG_IMPLANT | SUBCUTANEOUS | Status: AC
  Filled 2019-03-31: qty 3.6

## 2019-03-31 NOTE — Patient Instructions (Signed)

## 2019-04-02 ENCOUNTER — Other Ambulatory Visit: Payer: Self-pay | Admitting: Neurology

## 2019-04-02 ENCOUNTER — Other Ambulatory Visit: Payer: Self-pay | Admitting: Cardiology

## 2019-04-02 DIAGNOSIS — R072 Precordial pain: Secondary | ICD-10-CM

## 2019-04-02 DIAGNOSIS — I119 Hypertensive heart disease without heart failure: Secondary | ICD-10-CM

## 2019-04-05 ENCOUNTER — Other Ambulatory Visit: Payer: Self-pay | Admitting: Internal Medicine

## 2019-04-05 ENCOUNTER — Encounter: Payer: Self-pay | Admitting: *Deleted

## 2019-04-05 DIAGNOSIS — Z1211 Encounter for screening for malignant neoplasm of colon: Secondary | ICD-10-CM

## 2019-04-05 MED ORDER — SUPREP BOWEL PREP KIT 17.5-3.13-1.6 GM/177ML PO SOLN: 1.0000 | ORAL | 0 refills | Status: DC

## 2019-04-05 NOTE — Telephone Encounter (Signed)
Patient has been scheduled at [facility] endoscopy for a colonoscopy on 05/04/19 at 945 am with an 815 am arrival. She is also scheduled for COVID screening on 04/30/19 at 945 am at [address] [facility] location. Since patient has already received instructions previously for Suprep in our [place] location, I advised her that I will send updated instructions to her home address reflecting updated times and location. She is asked to call our office with any questions. She verbalizes understanding. She also verbalizes understanding that she needs to have a care partner 15 years of age or older to bring her to her appointment and to drive her home since she will be sedated.

## 2019-04-05 NOTE — Addendum Note (Signed)
Addended by: Larina Bras on: 04/05/2019 03:55 PM   Modules accepted: Orders

## 2019-04-14 ENCOUNTER — Ambulatory Visit (INDEPENDENT_AMBULATORY_CARE_PROVIDER_SITE_OTHER): Payer: Medicaid - Out of State

## 2019-04-14 ENCOUNTER — Ambulatory Visit (INDEPENDENT_AMBULATORY_CARE_PROVIDER_SITE_OTHER): Payer: Medicare Other | Admitting: Family Medicine

## 2019-04-14 ENCOUNTER — Encounter: Payer: Self-pay | Admitting: Family Medicine

## 2019-04-14 ENCOUNTER — Other Ambulatory Visit: Payer: Self-pay

## 2019-04-14 VITALS — BP 150/110 | HR 113 | Ht 64.0 in | Wt 206.0 lb

## 2019-04-14 DIAGNOSIS — M25571 Pain in right ankle and joints of right foot: Secondary | ICD-10-CM | POA: Insufficient documentation

## 2019-04-14 DIAGNOSIS — G8929 Other chronic pain: Secondary | ICD-10-CM | POA: Diagnosis not present

## 2019-04-14 DIAGNOSIS — M25572 Pain in left ankle and joints of left foot: Secondary | ICD-10-CM

## 2019-04-14 MED ORDER — PREDNISONE 50 MG PO TABS: ORAL_TABLET | ORAL | 0 refills | Status: DC

## 2019-04-14 NOTE — Assessment & Plan Note (Signed)
Patient has some nonspecific findings of the anterior lateral aspect of the right ankle.  Trace swelling noted of the joint.  Questionable uric acid deposits first possible pseudogout noted.  Seems to be acute on chronic.  We discussed compression, prednisone, proper shoes, home exercises, follow-up again in 4 weeks

## 2019-04-14 NOTE — Progress Notes (Signed)
[place] Phone: [phone] Subjective:   Erica [name], am serving as a scribe for Dr. Hulan Saas. This visit occurred during the SARS-CoV-2 public health emergency.  Safety protocols were in place, including screening questions prior to the visit, additional usage of staff PPE, and extensive cleaning of exam room while observing appropriate contact time as indicated for disinfecting solutions.    CC:   [id number]   03/10/2019 Patient given injection today, CAM Walker given today.  Discussed icing regimen and home exercises.  On ultrasound [name] significant arthritic changes of the joint noted.  Patient should do fairly well I am hoping.  Follow-up again in 3 to 4 weeks  Update 04/13/2019 Erica [name] is a 44 y.o. female coming in with complaint of left ankle pain. Patient states her right ankle is now bothering her over the tibialis anterior and into the lateral malleolous. Has not been wearing boot on left foot due to a strap breaking. Patient feel that she is 85% better on left side.     Past Medical History:  Diagnosis Date  . Allergy   . Anxiety   . Arthritis   . Asthma   . Blood transfusion without reported diagnosis   . Breast cancer ([place]) 2010   a. locally advanced left breast carcinoma diagnosed in 2010 and treated with neoadjuvant chemotherapy with docetaxel and cyclophosphamide as well as paclitaxel. This was followed by radiation therapy which was completed in 2011.  Marland Kitchen CHF (congestive heart failure) ([place])   . Chronic kidney disease   . Dyspnea on exertion    a. 01/2013 Lexi MV: EF 54%, [name] ischemia/infarct;  b. 05/2013 Echo: EF 60-65%, [name] rwma, Gr 2 DD;  b.   . Fatty liver   . Gastroparesis   . GERD (gastroesophageal reflux disease)   . Hypertension   . Impaired glucose tolerance 04/13/2011  . Lymphadenitis, chronic    restricted LEFT extremity  . 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 OSA (obstructive sleep apnea) 05/04/2014   AHI 31/hr now on CPAP at 12cm H2O  . Oxygen deficiency    Uses 02 in CPAP  . Personal history of chemotherapy 2010  . Personal history of radiation therapy 2010  . Sleep apnea    Past Surgical History:  Procedure Laterality Date  . BRAIN SURGERY     [place]  . BREAST LUMPECTOMY Left 09/2008  . CARPAL TUNNEL RELEASE Left 2011  . LYMPH NODE DISSECTION Left 2010   breast; 2 wks after breast lumpectomy  . TENDON RELEASE Right 2012   "hand"  . TUBAL LIGATION  05/11/2011   Procedure: POST PARTUM TUBAL LIGATION;  Surgeon: Emeterio Reeve, MD;  Location: [facility];  Service: Gynecology;  Laterality: Bilateral;  Induced for HTN   Social History   Socioeconomic History  . Marital status: Single    Spouse name: Not on file  . Number of children: 3  . Years of education: Not on file  . Highest education level: Not on file  Occupational History    Employer: UNEMPLOYED  Tobacco Use  . Smoking status: Never Smoker  . Smokeless tobacco: Never Used  Substance and Sexual Activity  . Alcohol use: Yes    Alcohol/week: 0.0 standard drinks    Comment: occasionally/socially  . Drug use: [name]  . Sexual activity: Yes    Birth control/protection: Surgical  Other Topics Concern  . Not on file  Social History Narrative   She lives with four children.   She is currently not working (disbaility pending).      Social Determinants of Health   Financial Resource Strain:   . Difficulty of Paying Living Expenses: Not on file  Food Insecurity:   . Worried About Charity fundraiser in the Last Year: Not on file  . Ran Out of Food in the Last Year: Not on file  Transportation Needs:   . Lack of Transportation (Medical): Not on file  . Lack of Transportation (Non-Medical): Not on file  Physical Activity:   . Days of Exercise per Week: Not on file  . Minutes of Exercise per Session: Not on file  Stress:   . Feeling of Stress : Not on file  Social Connections:    . Frequency of Communication with Friends and Family: Not on file  . Frequency of Social Gatherings with Friends and Family: Not on file  . Attends Religious Services: Not on file  . Active Member of Clubs or Organizations: Not on file  . Attends Archivist Meetings: Not on file  . Marital Status: Not on file   Allergies  Allergen Reactions  . Aspirin Shortness Of Breath and Palpitations    Pt can take ibuprofen without reaction  . Doxycycline Anaphylaxis  . Kiwi Extract Itching and Swelling    Lip swelling  . Metoprolol Hives and Itching  . Penicillins Shortness Of Breath and Palpitations    Has patient had a PCN reaction causing immediate rash, facial/tongue/throat swelling, SOB or lightheadedness with hypotension: Yes Has patient had a PCN reaction causing severe rash involving mucus membranes or skin necrosis: Yes Has patient had a PCN reaction that required hospitalization Yes Has patient had a PCN reaction occurring within the last 10 years: [name] If all of the above answers are "[name]", then may proceed with Cephalosporin use.  . Strawberry Extract Hives  . Sulfamethoxazole-Trimethoprim Anaphylaxis    Facial, throat and tongue swelling with difficulty swallowing.   Family History  Problem Relation Age of Onset  . Other Mother 61       breast calcifications treated with surgery, breast cancer pill, and radiation  . Fibroids Sister 7       s/p TAH-BSO  . Diabetes Maternal Grandmother   . Heart disease Maternal Grandmother   . Hypertension Maternal Grandmother   . Breast cancer Other        maternal great aunt (MGM's sister)  . Breast cancer Other        paternal great aunt dx middle ages; s/p mastectomy  . Cancer Neg Hx   . Alcohol abuse Neg Hx   . Early death Neg Hx   . Hyperlipidemia Neg Hx   . Kidney disease Neg Hx   . Stroke Neg Hx   . Colon cancer Neg Hx   . Esophageal cancer Neg Hx   . Stomach cancer Neg Hx   . Rectal cancer Neg Hx     Current  Outpatient Medications (Endocrine & Metabolic):  .  predniSONE (DELTASONE) 50 MG tablet, Take one tablet daily for the next 5 days.  Current Outpatient Medications (Cardiovascular):  .  acetaZOLAMIDE (DIAMOX) 250 MG tablet, TAKE 4 TABLETS BY MOUTH 3 TIMES DAILY. .  cloNIDine (CATAPRES - DOSED IN MG/24 HR) 0.3 mg/24hr patch, PLACE 1 PATCH ONTO SKIN ONCE WEEKLY .  diltiazem (CARDIZEM LA) 420 MG 24 hr tablet, Take 1 tablet (420 mg total) by mouth daily. Marland Kitchen  EPINEPHRINE 0.3 mg/0.3 mL IJ SOAJ injection, INJECT 0.3 MLS INTO THE MUSCLE ONCE AS NEEDED FOR ANAPHYLAXIS .  hydrALAZINE (APRESOLINE) 100 MG tablet, TAKE 1 TABLET (100 MG TOTAL) BY MOUTH 3 (THREE) TIMES DAILY. .  Nebivolol HCl 20 MG TABS, Take 2 tablets (40 mg total) by mouth daily. Marland Kitchen  NITROSTAT 0.4 MG SL tablet, PLACE 1 TABLET UNDER TONGUE EVERY 5 MINUTES AS NEEDED FOR CHEST PAIN, MAX OF 3 TABLETS THEN CALL 911 IF PAIN PERSISTS .  ranolazine (RANEXA) 500 MG 12 hr tablet, TAKE 1 TABLET BY MOUTH TWICE A DAY .  rosuvastatin (CRESTOR) 20 MG tablet, Take 1 tablet (20 mg total) by mouth daily. Marland Kitchen  spironolactone (ALDACTONE) 100 MG tablet, TAKE 1 TABLET BY MOUTH EVERY DAY .  torsemide (DEMADEX) 20 MG tablet, Take 1 tablet (20 mg total) by mouth daily. .  valsartan (DIOVAN) 320 MG tablet, TAKE 1 TABLET BY MOUTH EVERY DAY  Current Outpatient Medications (Respiratory):  .  albuterol (PROVENTIL) (2.5 MG/3ML) 0.083% nebulizer solution, USE 1 VIAL VIA NEBULIZER EVERY 4 HOURS AND AS NEEDED FOR WHEEZING OR SHORTNESS OF BREATH .  albuterol (VENTOLIN HFA) 108 (90 Base) MCG/ACT inhaler, INHALE 2 PUFFS BY MOUTH EVERY 4 HOURS AS NEEDED FOR WHEEZE OR FOR SHORTNESS OF BREATH .  dextromethorphan-guaiFENesin (MUCINEX DM) 30-600 MG 12hr tablet, Take 1 tablet by mouth 2 (two) times daily.  .  montelukast (SINGULAIR) 10 MG tablet, Take 1 tablet (10 mg total) by mouth at bedtime. Marland Kitchen  oxymetazoline (MUCINEX SINUS-MAX FULL FORCE) 0.05 % nasal spray, Place 1 spray into both  nostrils 2 (two) times daily as needed for congestion. .  promethazine (PHENERGAN) 25 MG tablet, Take 1 tablet (25 mg total) by mouth 3 (three) times daily as needed for nausea or vomiting. .  SYMBICORT 160-4.5 MCG/ACT inhaler, INHALE 2 PUFFS INTO LUNGS 2 TIMES DAILY AS DIRECTED  Current Outpatient Medications (Analgesics):  .  acetaminophen-codeine (TYLENOL #4) 300-60 MG tablet, TAKE 1 TABLET BY MOUTH EVERY 4 (FOUR) HOURS AS NEEDED for cough   Current Outpatient Medications (Other):  .  amitriptyline (ELAVIL) 100 MG tablet, TAKE 1 TABLET BY MOUTH EVERYDAY AT BEDTIME .  BIOTIN PO, Take 1 tablet by mouth daily. .  Cholecalciferol 1.25 MG (50000 UT) capsule, Take 1 capsule (50,000 Units total) by mouth once a week. .  clotrimazole (MYCELEX) 10 MG troche, Take 1 tablet (10 mg total) by mouth 5 (five) times daily. Marland Kitchen  dexlansoprazole (DEXILANT) 60 MG capsule, Take 1 capsule (60 mg total) by mouth daily. .  Diclofenac Sodium 2 % SOLN, Place 2 g onto the skin 2 (two) times daily. .  DULoxetine (CYMBALTA) 30 MG capsule, Take 1 capsule (30 mg total) by mouth daily. With 59m for total of 90 mg .  DULoxetine (CYMBALTA) 60 MG capsule, Take 1 capsule (60 mg total) by mouth daily. For total of 90 mg .  famotidine (PEPCID) 20 MG tablet, Take 1-2 tablets (20-40 mg total) by mouth at bedtime. .  lidocaine-prilocaine (EMLA) cream, Apply 1 application topically. Apply 1 application to skin ever 28 days .  metoCLOPramide (REGLAN) 10 MG tablet, Take 1 tablet (10 mg total) by mouth 4 (four) times daily. .  OXYGEN, Use CPAP at bedtime .  Potassium Chloride ER 20 MEQ TBCR, TAKE 1 TABLET BY MOUTH TWICE A DAY .  primidone (MYSOLINE) 50 MG tablet, TAKE ONE-HALF TABLET (25 MG TOTAL) BY MOUTH AT BEDTIME. .Marland Kitchen Respiratory Therapy Supplies (FLUTTER) DEVI, Use as directed .  Spacer/Aero-Holding Chambers (AEROCHAMBER MV) inhaler, Use as instructed .  SUPREP BOWEL PREP KIT 17.5-3.13-1.6 GM/177ML SOLN, Take 1 kit by mouth as  directed. For colonoscopy prep    Past medical history, social, surgical and family history all reviewed in electronic medical record.  [name] pertanent information unless stated regarding to the chief complaint.   Review of Systems:  [name] headache, visual changes, nausea, vomiting, diarrhea, constipation, dizziness, abdominal pain, skin rash, fevers, chills, night sweats, weight loss, swollen lymph nodes,  chest pain, shortness of breath, mood changes.  Positive muscle aches, body aches  Objective  Blood pressure (!) 150/110, pulse (!) 113, height '5\' 4"'  (1.626 m), weight 206 lb (93.4 kg), SpO2 98 %.    General: [name] apparent distress alert and oriented x3 mood and affect normal, dressed appropriately.  HEENT: Pupils equal, extraocular movements intact  Respiratory: Patient's speak in full sentences and does not appear short of breath  Cardiovascular: [name] lower extremity edema, non tender, [name] erythema  Skin: Warm dry intact with [name] signs of infection or rash on extremities or on axial skeleton.  Abdomen: Soft nontender  Neuro: Cranial nerves II through XII are intact, neurovascularly intact in all extremities with 2+ DTRs and 2+ pulses.  Lymph: [name] lymphadenopathy of posterior or anterior cervical chain or axillae bilaterally.  Gait antalgic MSK:  Non tender with full range of motion and good stability and symmetric strength and tone of shoulders, elbows, wrist, hip, knee and bilaterally.   Right ankle exam shows the patient does have some mild swelling laterally.  Severe pes planus of the feet bilaterally.  Patient does have tenderness more on the anterior lateral aspect but [name] pain over the peroneal tendons.  Neurovascularly intact distally but does have some baseline peripheral neuropathy.  Deep tendon reflexes intact.  Patient has 4 out of 5 strength but fairly symmetric to the contralateral side with dorsiflexion    Limited musculoskeletal ultrasound was performed and interpreted by Lyndal Pulley  Limited ultrasound of patient's right ankle shows some nonspecific calcific deposits of the anterior lateral aspect.  Difficult to assess patient's ATFL.  Mild increase in Doppler flow.  Consistent with possible gout or pseudogout deposits.  [name] cortical irregularities are noted.    Impression and Recommendations:     This case required medical decision making of moderate complexity. The above documentation has been reviewed and is accurate and complete Lyndal Pulley, DO       Note: This dictation was prepared with Dragon dictation along with smaller phrase technology. Any transcriptional errors that result from this process are unintentional.

## 2019-04-14 NOTE — Patient Instructions (Signed)
Tart cherry 1200mg  at night  Prednisone Can wear brace but likely wont need it See me again in 4 weeks

## 2019-04-15 ENCOUNTER — Encounter: Payer: Medicare Other | Admitting: Family Medicine

## 2019-04-19 ENCOUNTER — Telehealth: Payer: Self-pay | Admitting: *Deleted

## 2019-04-19 NOTE — Telephone Encounter (Signed)
-----   Message from Jerene Bears, MD sent at 04/15/2019  8:42 PM EST ----- [facility] colonoscopy for 05/04/2019 needs to be canceled and postponed until after pandemic restrictions for elective cases are lifted. Please cancel and add to our hospital procedure waitlist (create one if needed) Many thanks JMP

## 2019-04-19 NOTE — Telephone Encounter (Signed)
Attempted to reach patient but was unable to reach her as her voicemail has not been set up. Will attempt to reach her again at a later time.

## 2019-04-21 NOTE — Telephone Encounter (Signed)
I have spoken to patient to advise that unfortunately, we will need to postpone her screening colonoscopy until after pandemic restrictions have been lifted by the hospital. Patient verbalizes understanding. Case has been cancelled at this time.

## 2019-04-23 ENCOUNTER — Encounter: Payer: Self-pay | Admitting: *Deleted

## 2019-04-23 ENCOUNTER — Ambulatory Visit: Payer: Medicare Other | Admitting: Cardiology

## 2019-04-23 ENCOUNTER — Encounter: Payer: Self-pay | Admitting: Cardiology

## 2019-04-23 ENCOUNTER — Ambulatory Visit (INDEPENDENT_AMBULATORY_CARE_PROVIDER_SITE_OTHER): Payer: Medicare Other | Admitting: Cardiology

## 2019-04-23 ENCOUNTER — Other Ambulatory Visit: Payer: Self-pay

## 2019-04-23 ENCOUNTER — Telehealth: Payer: Self-pay | Admitting: *Deleted

## 2019-04-23 VITALS — BP 176/116 | HR 61 | Ht 66.0 in | Wt 209.8 lb

## 2019-04-23 DIAGNOSIS — I110 Hypertensive heart disease with heart failure: Secondary | ICD-10-CM

## 2019-04-23 DIAGNOSIS — G4733 Obstructive sleep apnea (adult) (pediatric): Secondary | ICD-10-CM | POA: Diagnosis not present

## 2019-04-23 DIAGNOSIS — I10 Essential (primary) hypertension: Secondary | ICD-10-CM

## 2019-04-23 DIAGNOSIS — I1A0 Resistant hypertension: Secondary | ICD-10-CM

## 2019-04-23 DIAGNOSIS — I5043 Acute on chronic combined systolic (congestive) and diastolic (congestive) heart failure: Secondary | ICD-10-CM | POA: Diagnosis not present

## 2019-04-23 DIAGNOSIS — G932 Benign intracranial hypertension: Secondary | ICD-10-CM

## 2019-04-23 NOTE — Progress Notes (Signed)
Cardiology Office Note:    Date:  04/23/2019   ID:  Erica [name], DOB 21-May-1975, MRN [id number]  PCP:  [name], MD  Cardiologist:  Ena Dawley, MD  Referring MD: [name], MD   Chief complaint: Chest pain, DOE, hypoxia  History of Present Illness:    Erica [name] is a 44 y.o. female with a past medical history significant for breast cancer 2010 treated with chemo and radiation-now on immunotherapy, asthma, chronic cough, hypertension, GERD, OSA on CPAP and obesity.  She also has a history of recurrent syncope with cardiac monitor showing only sinus rhythm.  This was felt to be due to narcolepsy and was  treated with CPAP for sleep apnea.  She does not tolerate her CPAP well, she has tried 3 different mask and still feels like she is suffocating and does not use it on a regular basis.  She was supposed to be tested for BiPAP however this was right when Covid hit and her appointments have been rescheduled several times.  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  She has had significant resistant hypertension on combination of multiple drugs, she underwent testing including renal and renal artery ultrasound that showed normal size kidney and parenchyma in 2018 with less than 60% stenosis in the right renal artery and no stenosis in left renal artery, repeated scanning 2020 showed less than 60% stenosis bilaterally.  The patient has been treated with combination of 6 medications including high-dose of spironolactone that will cover hyperaldosteronic state however remains severely hypertensive and also tachycardic despite nebivolol 40 mg daily.  She now states that she is getting progressively more short of breath she is unable to walk from the parking lot to our clinic without stopping, she is unable to lay flat, she measured her pulse ox at night and it was as low as 75%.  She is currently not using CPAP  at night.  She has no lower extremity edema.  Past Medical History:  Diagnosis Date  . Allergy   . Anxiety   . Arthritis   . Asthma   . Blood transfusion without reported diagnosis   . Breast cancer ([place]) 2010   a. locally advanced left breast carcinoma diagnosed in 2010 and treated with neoadjuvant chemotherapy with docetaxel and cyclophosphamide as well as paclitaxel. This was followed by radiation therapy which was completed in 2011.  Marland Kitchen CHF (congestive heart failure) ([place])   . Chronic kidney disease   . Dyspnea on exertion    a. 01/2013 Lexi MV: EF 54%, no ischemia/infarct;  b. 05/2013 Echo: EF 60-65%, no rwma, Gr 2 DD;  b.   . Fatty liver   . Gastroparesis   . GERD (gastroesophageal reflux disease)   . Hypertension   . Impaired glucose tolerance 04/13/2011  . Lymphadenitis, chronic    restricted LEFT extremity  . 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 OSA (obstructive sleep apnea) 05/04/2014   AHI 31/hr now on CPAP at 12cm H2O  . Oxygen deficiency    Uses 02 in CPAP  . Personal history of chemotherapy 2010  . Personal history of radiation therapy 2010  . Sleep apnea     Past Surgical History:  Procedure Laterality Date  . BRAIN SURGERY     Shunt  . BREAST LUMPECTOMY Left 09/2008  . CARPAL TUNNEL RELEASE Left 2011  . LYMPH NODE DISSECTION Left 2010   breast; 2 wks after  breast lumpectomy  . TENDON RELEASE Right 2012   "hand"  . TUBAL LIGATION  05/11/2011   Procedure: POST PARTUM TUBAL LIGATION;  Surgeon: Emeterio Reeve, MD;  Location: [facility];  Service: Gynecology;  Laterality: Bilateral;  Induced for HTN   Current Medications: Current Meds  Medication Sig  . acetaminophen-codeine (TYLENOL #4) 300-60 MG tablet TAKE 1 TABLET BY MOUTH EVERY 4 (FOUR) HOURS AS NEEDED for cough  . acetaZOLAMIDE (DIAMOX) 250 MG tablet TAKE 4 TABLETS BY MOUTH 3 TIMES DAILY.  Marland Kitchen albuterol (PROVENTIL) (2.5 MG/3ML) 0.083% nebulizer solution USE 1 VIAL VIA NEBULIZER EVERY 4 HOURS AND AS NEEDED FOR  WHEEZING OR SHORTNESS OF BREATH  . albuterol (VENTOLIN HFA) 108 (90 Base) MCG/ACT inhaler INHALE 2 PUFFS BY MOUTH EVERY 4 HOURS AS NEEDED FOR WHEEZE OR FOR SHORTNESS OF BREATH  . amitriptyline (ELAVIL) 100 MG tablet TAKE 1 TABLET BY MOUTH EVERYDAY AT BEDTIME  . BIOTIN PO Take 1 tablet by mouth daily.  . Cholecalciferol 1.25 MG (50000 UT) capsule Take 1 capsule (50,000 Units total) by mouth once a week.  . cloNIDine (CATAPRES - DOSED IN MG/24 HR) 0.3 mg/24hr patch PLACE 1 PATCH ONTO SKIN ONCE WEEKLY  . clotrimazole (MYCELEX) 10 MG troche Take 1 tablet (10 mg total) by mouth 5 (five) times daily.  Marland Kitchen dexlansoprazole (DEXILANT) 60 MG capsule Take 1 capsule (60 mg total) by mouth daily.  Marland Kitchen dextromethorphan-guaiFENesin (MUCINEX DM) 30-600 MG 12hr tablet Take 1 tablet by mouth 2 (two) times daily.   . Diclofenac Sodium 2 % SOLN Place 2 g onto the skin 2 (two) times daily.  Marland Kitchen diltiazem (CARDIZEM LA) 420 MG 24 hr tablet Take 1 tablet (420 mg total) by mouth daily.  . DULoxetine (CYMBALTA) 30 MG capsule Take 1 capsule (30 mg total) by mouth daily. With 44m for total of 90 mg  . DULoxetine (CYMBALTA) 60 MG capsule Take 1 capsule (60 mg total) by mouth daily. For total of 90 mg  . EPINEPHRINE 0.3 mg/0.3 mL IJ SOAJ injection INJECT 0.3 MLS INTO THE MUSCLE ONCE AS NEEDED FOR ANAPHYLAXIS  . famotidine (PEPCID) 20 MG tablet Take 1-2 tablets (20-40 mg total) by mouth at bedtime.  . hydrALAZINE (APRESOLINE) 100 MG tablet TAKE 1 TABLET (100 MG TOTAL) BY MOUTH 3 (THREE) TIMES DAILY.  .Marland Kitchenlidocaine-prilocaine (EMLA) cream Apply 1 application topically. Apply 1 application to skin ever 28 days  . metoCLOPramide (REGLAN) 10 MG tablet Take 1 tablet (10 mg total) by mouth 4 (four) times daily.  . montelukast (SINGULAIR) 10 MG tablet Take 1 tablet (10 mg total) by mouth at bedtime.  . Nebivolol HCl 20 MG TABS Take 2 tablets (40 mg total) by mouth daily.  .Marland KitchenNITROSTAT 0.4 MG SL tablet PLACE 1 TABLET UNDER TONGUE EVERY 5  MINUTES AS NEEDED FOR CHEST PAIN, MAX OF 3 TABLETS THEN CALL 911 IF PAIN PERSISTS  . OXYGEN Use CPAP at bedtime  . oxymetazoline (MUCINEX SINUS-MAX FULL FORCE) 0.05 % nasal spray Place 1 spray into both nostrils 2 (two) times daily as needed for congestion.  . Potassium Chloride ER 20 MEQ TBCR TAKE 1 TABLET BY MOUTH TWICE A DAY  . primidone (MYSOLINE) 50 MG tablet TAKE ONE-HALF TABLET (25 MG TOTAL) BY MOUTH AT BEDTIME.  . promethazine (PHENERGAN) 25 MG tablet Take 1 tablet (25 mg total) by mouth 3 (three) times daily as needed for nausea or vomiting.  . ranolazine (RANEXA) 500 MG 12 hr tablet TAKE 1 TABLET BY MOUTH TWICE  A DAY  . Respiratory Therapy Supplies (FLUTTER) DEVI Use as directed  . rosuvastatin (CRESTOR) 20 MG tablet Take 1 tablet (20 mg total) by mouth daily.  Marland Kitchen Spacer/Aero-Holding Chambers (AEROCHAMBER MV) inhaler Use as instructed  . spironolactone (ALDACTONE) 100 MG tablet TAKE 1 TABLET BY MOUTH EVERY DAY  . SUPREP BOWEL PREP KIT 17.5-3.13-1.6 GM/177ML SOLN Take 1 kit by mouth as directed. For colonoscopy prep  . SYMBICORT 160-4.5 MCG/ACT inhaler INHALE 2 PUFFS INTO LUNGS 2 TIMES DAILY AS DIRECTED  . torsemide (DEMADEX) 20 MG tablet Take 1 tablet (20 mg total) by mouth daily.  . valsartan (DIOVAN) 320 MG tablet TAKE 1 TABLET BY MOUTH EVERY DAY    Allergies:   Aspirin, Doxycycline, Kiwi extract, Metoprolol, Penicillins, Strawberry extract, and Sulfamethoxazole-trimethoprim   Social History   Socioeconomic History  . Marital status: Single    Spouse name: Not on file  . Number of children: 3  . Years of education: Not on file  . Highest education level: Not on file  Occupational History    Employer: UNEMPLOYED  Tobacco Use  . Smoking status: Never Smoker  . Smokeless tobacco: Never Used  Substance and Sexual Activity  . Alcohol use: Yes    Alcohol/week: 0.0 standard drinks    Comment: occasionally/socially  . Drug use: No  . Sexual activity: Yes    Birth  control/protection: Surgical  Other Topics Concern  . Not on file  Social History Narrative   She lives with four children.   She is currently not working (disbaility pending).      Social Determinants of Health   Financial Resource Strain:   . Difficulty of Paying Living Expenses: Not on file  Food Insecurity:   . Worried About Charity fundraiser in the Last Year: Not on file  . Ran Out of Food in the Last Year: Not on file  Transportation Needs:   . Lack of Transportation (Medical): Not on file  . Lack of Transportation (Non-Medical): Not on file  Physical Activity:   . Days of Exercise per Week: Not on file  . Minutes of Exercise per Session: Not on file  Stress:   . Feeling of Stress : Not on file  Social Connections:   . Frequency of Communication with Friends and Family: Not on file  . Frequency of Social Gatherings with Friends and Family: Not on file  . Attends Religious Services: Not on file  . Active Member of Clubs or Organizations: Not on file  . Attends Archivist Meetings: Not on file  . Marital Status: Not on file    Family History: The patient's family history includes Breast cancer in some other family members; Diabetes in her maternal grandmother; Fibroids (age of onset: 61) in her sister; Heart disease in her maternal grandmother; Hypertension in her maternal grandmother; Other (age of onset: 73) in her mother. There is no history of Cancer, Alcohol abuse, Early death, Hyperlipidemia, Kidney disease, Stroke, Colon cancer, Esophageal cancer, Stomach cancer, or Rectal cancer.  ROS:   Please see the history of present illness.     All other systems reviewed and are negative.  EKGs/Labs/Other Studies Reviewed:    The following studies were reviewed today:  Nuclear stress test: 02/15/2013 Impression Exercise Capacity: Lexiscan with no exercise.  BP Response: Hypertensive blood pressure response.  Clinical Symptoms: Chest pain, headache.  ECG  Impression: No significant ST segment change suggestive of ischemia.  Comparison with Prior Nuclear Study: No images to  compare  Overall Impression: Normal stress nuclear study.  LV Ejection Fraction: 54%. LV Wall Motion: NL LV Function; NL Wall Motion  Loralie Champagne  02/12/2013  TTE 06/17/2013 Left ventricle: The cavity size was normal. There was mild concentric hypertrophy. Systolic function was normal. The estimated ejection fraction was in the range of 60% to 65%. Wall motion was normal; there were no regional wall motion abnormalities. Features are consistent with a pseudonormal left ventricular filling pattern, with concomitant abnormal relaxation and increased filling pressure (grade 2 diastolic dysfunction). Doppler parameters are consistent with high ventricular filling pressure. Impressions: - When compared to 2010, EF has improved.  TTE: 09/18/15 - Left ventricle: The cavity size was normal. Wall thickness was increased in a pattern of severe LVH. Systolic function was vigorous. The estimated ejection fraction was in the range of 65% to 70%. Doppler parameters are consistent with abnormal left ventricular relaxation (grade 1 diastolic dysfunction).  TTE: 03/2018 - Left ventricle: The cavity size was normal. Wall thickness was   increased in a pattern of moderate LVH. Systolic function was   mildly reduced. The estimated ejection fraction was in the range   of 45% to 50%. Wall motion was normal; there were no regional   wall motion abnormalities. Features are consistent with a   pseudonormal left ventricular filling pattern, with concomitant   abnormal relaxation and increased filling pressure (grade 2   diastolic dysfunction). - Mitral valve: There was mild regurgitation. - Pulmonary arteries: Systolic pressure was mildly increased. PA   peak pressure: 33 mm Hg (S).  EKG:  EKG is not ordered today.   Recent Labs: 12/09/2018: ALT 27; BUN 16; Creatinine  1.27; Hemoglobin 13.4; Platelet Count 203; Potassium 3.1; Sodium 143   Recent Lipid Panel    Component Value Date/Time   CHOL 198 10/01/2018 1115   TRIG 113.0 10/01/2018 1115   HDL 45.50 10/01/2018 1115   CHOLHDL 4 10/01/2018 1115   VLDL 22.6 10/01/2018 1115   LDLCALC 130 (H) 10/01/2018 1115   LDLDIRECT 152.0 03/30/2018 1604    Physical Exam:    VS:  BP (!) 176/116   Pulse 61   Ht '5\' 6"'  (1.676 m)   Wt 209 lb 12.8 oz (95.2 kg)   SpO2 98%   BMI 33.86 kg/m     Wt Readings from Last 3 Encounters:  04/23/19 209 lb 12.8 oz (95.2 kg)  04/14/19 206 lb (93.4 kg)  03/10/19 206 lb (93.4 kg)    Physical Exam  Constitutional: She is oriented to person, place, and time. She appears well-developed and well-nourished. No distress.  Cental obesity  HENT:  Head: Normocephalic and atraumatic.  Neck: No JVD present.  Cardiovascular: Normal rate, regular rhythm, normal heart sounds and intact distal pulses. Exam reveals no gallop and no friction rub.  No murmur heard. Pulmonary/Chest: Effort normal and breath sounds normal. No respiratory distress. She has no wheezes. She has no rales.  Abdominal: Soft. Bowel sounds are normal.  Musculoskeletal:        General: No deformity or edema. Normal range of motion.     Cervical back: Normal range of motion and neck supple.  Neurological: She is alert and oriented to person, place, and time.  Skin: Skin is warm and dry.  Psychiatric: She has a normal mood and affect. Her behavior is normal. Judgment and thought content normal.  Vitals reviewed.   ASSESSMENT:    1. Hypertensive heart disease with heart failure ([place])   2. Resistant hypertension  3. Acute on chronic combined systolic and diastolic CHF (congestive heart failure) ([place])   4. OSA (obstructive sleep apnea)   5. Pseudotumor cerebri syndrome    PLAN:    In order of problems listed above:  Hypertensive heart disease, severe resistant hypertension -Long history of difficult to  control hypertension blood pressure - No stenosis on renal artery ultrasound, normal renal parenchyma 03/2019, kidney cyst -She is currently on Spironolactone 100 mg daily, clonidine 0.3 mg daily, Cardizem 420 mg daily, hydralazine 100 mg 3 times a day and valsartan 320 mg daily, Nebivolol 40 mg po daily. -Outreach to the hypertension clinic for any further advice. -She now has a new LV dysfunction most probably secondary to poorly controlled blood pressure. - we will obtain renin/aldosterone ratio  Chronic combined systolic diastolic CHF, LVEF 45 to 73%, previously 60%, NYHA class III -Secondary to poorly controlled hypertension. -She is on valsartan and Bystolic, hydralazine, did not tolerate Imdur secondary to headaches (probably sec to underlying pseudotumor cerebri) -we will order left and right cardiac cath by Dr Haroldine Laws  Chest pain -I suspect this is microvascular disease from poorly controlled hypertension, she was recently started on rosuvastatin - she was scheduled for coronary CTA but was too tachycardic, also developed allergic reaction to metoprolol - we will order left and right cardiac cath  OSA, with desaturation at night down to 70' - On CPAP, however does not use it as she does not tolerated well. We arrange to be be tested fpr BiPAP ASAP.  Medication Adjustments/Labs and Tests Ordered: Current medicines are reviewed at length with the patient today.  Concerns regarding medicines are outlined above. Labs and tests ordered and medication changes are outlined in the patient instructions below:  Patient Instructions  Medication Instructions:   Your physician recommends that you continue on your current medications as directed. Please refer to the Current Medication list given to you today.  *If you need a refill on your cardiac medications before your next appointment, please call your pharmacy*    Testing/Procedures:  Your physician has requested that you have a  cardiac catheterization. Cardiac catheterization is used to diagnose and/or treat various heart conditions. Doctors may recommend this procedure for a number of different reasons. The most common reason is to evaluate chest pain. Chest pain can be a symptom of coronary artery disease (CAD), and cardiac catheterization can show whether plaque is narrowing or blocking your heart's arteries. This procedure is also used to evaluate the valves, as well as measure the blood flow and oxygen levels in different parts of your heart. For further information please visit HugeFiesta.[email or web address]. Please follow instruction sheet, as given.  YOU WILL NEED TO BE SCHEDULED TO HAVE A RIGHT AND LEFT HEART CATH FOR DR. Missy Sabins TO DO.  WE WILL CALL YOU TO HAVE THIS ARRANGED ONCE HIS NURSE GETS BACK WITH DATES HE CAN DO THIS.  WE WILL ARRANGE YOUR LABS AT THAT TIME AS WELL.   You have been referred to [place] SEE DR. [place].  DR. Buras'S NURSE WILL BE IN CONTACT WITH YOU SOON TO ARRANGE THIS APPOINTMENT   Follow-Up: At [facility], you and your health needs are our priority.  As part of our continuing mission to provide you with exceptional heart care, we have created designated Provider Care Teams.  These Care Teams include your primary Cardiologist (physician) and Advanced Practice Providers (APPs -  Physician Assistants and Nurse Practitioners) who all work together to  provide you with the care you need, when you need it.  Your next appointment:   2 month(s)--3/17/21AT 10:20 AM WITH DR. Meda Coffee IN THE CLINIC  The format for your next appointment:   In Person  Provider:   Ena Dawley, MD      Signed, Ena Dawley, MD  04/23/2019 5:57 PM    [place]

## 2019-04-23 NOTE — Telephone Encounter (Addendum)
Unable to reach patient or leave message Erica [name] message to call Monday and schedule with me

## 2019-04-23 NOTE — Patient Instructions (Addendum)
Medication Instructions:   Your physician recommends that you continue on your current medications as directed. Please refer to the Current Medication list given to you today.  *If you need a refill on your cardiac medications before your next appointment, please call your pharmacy*   Your physician recommends that you return for lab work in: [place] BMET, CBC, TSH, PRO-BNP, RENIN-ALDOSTERONE--WE WILL CALL YOU ARRANGE   Testing/Procedures:  Your physician has requested that you have a cardiac catheterization. Cardiac catheterization is used to diagnose and/or treat various heart conditions. Doctors may recommend this procedure for a number of different reasons. The most common reason is to evaluate chest pain. Chest pain can be a symptom of coronary artery disease (CAD), and cardiac catheterization can show whether plaque is narrowing or blocking your heart's arteries. This procedure is also used to evaluate the valves, as well as measure the blood flow and oxygen levels in different parts of your heart. For further information please visit HugeFiesta[facility][email or web address]. Please follow instruction sheet, as given.  YOU WILL NEED TO BE SCHEDULED TO HAVE A RIGHT AND LEFT HEART CATH FOR DR. Missy Sabins TO DO.  WE WILL CALL YOU TO HAVE THIS ARRANGED ONCE HIS NURSE GETS BACK WITH DATES HE CAN DO THIS.  WE WILL ARRANGE YOUR LABS AT THAT TIME AS WELL.   You have been referred to [place] SEE DR. [place].  DR. [place]'S NURSE WILL BE IN CONTACT WITH YOU SOON TO ARRANGE THIS APPOINTMENT   Follow-Up: At [facility], you and your health needs are our priority.  As part of our continuing mission to provide you with exceptional heart care, we have created designated Provider Care Teams.  These Care Teams include your primary Cardiologist (physician) and Advanced Practice Providers (APPs -   Physician Assistants and Nurse Practitioners) who all work together to provide you with the care you need, when you need it.  Your next appointment:   2 month(s)--3/17/21AT 10:20 AM WITH DR. Meda Coffee IN THE CLINIC  The format for your next appointment:   In Person  Provider:   Ena Dawley, MD

## 2019-04-23 NOTE — H&P (View-Only) (Signed)
Cardiology Office Note:    Date:  04/23/2019   ID:  Erica [name], DOB [birth year], MRN [id number]  PCP:  [name], MD  Cardiologist:  Ena Dawley, MD  Referring MD: [name], MD   Chief complaint: Chest pain, DOE, hypoxia  History of Present Illness:    Erica [name] is a 44 y.o. female with a past medical history significant for breast cancer 2010 treated with chemo and radiation-now on immunotherapy, asthma, chronic cough, hypertension, GERD, OSA on CPAP and obesity.  She also has a history of recurrent syncope with cardiac monitor showing only sinus rhythm.  This was felt to be due to narcolepsy and was  treated with CPAP for sleep apnea.  She does not tolerate her CPAP well, she has tried 3 different mask and still feels like she is suffocating and does not use it on a regular basis.  She was supposed to be tested for BiPAP however this was right when Covid hit and her appointments have been rescheduled several times.  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  She has had significant resistant hypertension on combination of multiple drugs, she underwent testing including renal and renal artery ultrasound that showed normal size kidney and parenchyma in 2018 with less than 60% stenosis in the right renal artery and no stenosis in left renal artery, repeated scanning 2020 showed less than 60% stenosis bilaterally.  The patient has been treated with combination of 6 medications including high-dose of spironolactone that will cover hyperaldosteronic state however remains severely hypertensive and also tachycardic despite nebivolol 40 mg daily.  She now states that she is getting progressively more short of breath she is unable to walk from the parking lot to our clinic without stopping, she is unable to lay flat, she measured her pulse ox at night and it was as low as 75%.  She is currently not using CPAP  at night.  She has no lower extremity edema.  Past Medical History:  Diagnosis Date  . Allergy   . Anxiety   . Arthritis   . Asthma   . Blood transfusion without reported diagnosis   . Breast cancer ([place]) 2010   a. locally advanced left breast carcinoma diagnosed in 2010 and treated with neoadjuvant chemotherapy with docetaxel and cyclophosphamide as well as paclitaxel. This was followed by radiation therapy which was completed in 2011.  Marland Kitchen CHF (congestive heart failure) ([place])   . Chronic kidney disease   . Dyspnea on exertion    a. 01/2013 Lexi MV: EF 54%, no ischemia/infarct;  b. 05/2013 Echo: EF 60-65%, no rwma, Gr 2 DD;  b.   . Fatty liver   . Gastroparesis   . GERD (gastroesophageal reflux disease)   . Hypertension   . Impaired glucose tolerance 04/13/2011  . Lymphadenitis, chronic    restricted LEFT extremity  . 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 OSA (obstructive sleep apnea) 05/04/2014   AHI 31/hr now on CPAP at 12cm H2O  . Oxygen deficiency    Uses 02 in CPAP  . Personal history of chemotherapy 2010  . Personal history of radiation therapy 2010  . Sleep apnea     Past Surgical History:  Procedure Laterality Date  . BRAIN SURGERY     Shunt  . BREAST LUMPECTOMY Left 09/2008  . CARPAL TUNNEL RELEASE Left 2011  . LYMPH NODE DISSECTION Left 2010   breast; 2 wks after  breast lumpectomy  . TENDON RELEASE Right 2012   "hand"  . TUBAL LIGATION  05/11/2011   Procedure: POST PARTUM TUBAL LIGATION;  Surgeon: Emeterio Reeve, MD;  Location: [facility];  Service: Gynecology;  Laterality: Bilateral;  Induced for HTN   Current Medications: Current Meds  Medication Sig  . acetaminophen-codeine (TYLENOL #4) 300-60 MG tablet TAKE 1 TABLET BY MOUTH EVERY 4 (FOUR) HOURS AS NEEDED for cough  . acetaZOLAMIDE (DIAMOX) 250 MG tablet TAKE 4 TABLETS BY MOUTH 3 TIMES DAILY.  Marland Kitchen albuterol (PROVENTIL) (2.5 MG/3ML) 0.083% nebulizer solution USE 1 VIAL VIA NEBULIZER EVERY 4 HOURS AND AS NEEDED FOR  WHEEZING OR SHORTNESS OF BREATH  . albuterol (VENTOLIN HFA) 108 (90 Base) MCG/ACT inhaler INHALE 2 PUFFS BY MOUTH EVERY 4 HOURS AS NEEDED FOR WHEEZE OR FOR SHORTNESS OF BREATH  . amitriptyline (ELAVIL) 100 MG tablet TAKE 1 TABLET BY MOUTH EVERYDAY AT BEDTIME  . BIOTIN PO Take 1 tablet by mouth daily.  . Cholecalciferol 1.25 MG (50000 UT) capsule Take 1 capsule (50,000 Units total) by mouth once a week.  . cloNIDine (CATAPRES - DOSED IN MG/24 HR) 0.3 mg/24hr patch PLACE 1 PATCH ONTO SKIN ONCE WEEKLY  . clotrimazole (MYCELEX) 10 MG troche Take 1 tablet (10 mg total) by mouth 5 (five) times daily.  Marland Kitchen dexlansoprazole (DEXILANT) 60 MG capsule Take 1 capsule (60 mg total) by mouth daily.  Marland Kitchen dextromethorphan-guaiFENesin (MUCINEX DM) 30-600 MG 12hr tablet Take 1 tablet by mouth 2 (two) times daily.   . Diclofenac Sodium 2 % SOLN Place 2 g onto the skin 2 (two) times daily.  Marland Kitchen diltiazem (CARDIZEM LA) 420 MG 24 hr tablet Take 1 tablet (420 mg total) by mouth daily.  . DULoxetine (CYMBALTA) 30 MG capsule Take 1 capsule (30 mg total) by mouth daily. With 44m for total of 90 mg  . DULoxetine (CYMBALTA) 60 MG capsule Take 1 capsule (60 mg total) by mouth daily. For total of 90 mg  . EPINEPHRINE 0.3 mg/0.3 mL IJ SOAJ injection INJECT 0.3 MLS INTO THE MUSCLE ONCE AS NEEDED FOR ANAPHYLAXIS  . famotidine (PEPCID) 20 MG tablet Take 1-2 tablets (20-40 mg total) by mouth at bedtime.  . hydrALAZINE (APRESOLINE) 100 MG tablet TAKE 1 TABLET (100 MG TOTAL) BY MOUTH 3 (THREE) TIMES DAILY.  .Marland Kitchenlidocaine-prilocaine (EMLA) cream Apply 1 application topically. Apply 1 application to skin ever 28 days  . metoCLOPramide (REGLAN) 10 MG tablet Take 1 tablet (10 mg total) by mouth 4 (four) times daily.  . montelukast (SINGULAIR) 10 MG tablet Take 1 tablet (10 mg total) by mouth at bedtime.  . Nebivolol HCl 20 MG TABS Take 2 tablets (40 mg total) by mouth daily.  .Marland KitchenNITROSTAT 0.4 MG SL tablet PLACE 1 TABLET UNDER TONGUE EVERY 5  MINUTES AS NEEDED FOR CHEST PAIN, MAX OF 3 TABLETS THEN CALL 911 IF PAIN PERSISTS  . OXYGEN Use CPAP at bedtime  . oxymetazoline (MUCINEX SINUS-MAX FULL FORCE) 0.05 % nasal spray Place 1 spray into both nostrils 2 (two) times daily as needed for congestion.  . Potassium Chloride ER 20 MEQ TBCR TAKE 1 TABLET BY MOUTH TWICE A DAY  . primidone (MYSOLINE) 50 MG tablet TAKE ONE-HALF TABLET (25 MG TOTAL) BY MOUTH AT BEDTIME.  . promethazine (PHENERGAN) 25 MG tablet Take 1 tablet (25 mg total) by mouth 3 (three) times daily as needed for nausea or vomiting.  . ranolazine (RANEXA) 500 MG 12 hr tablet TAKE 1 TABLET BY MOUTH TWICE  A DAY  . Respiratory Therapy Supplies (FLUTTER) DEVI Use as directed  . rosuvastatin (CRESTOR) 20 MG tablet Take 1 tablet (20 mg total) by mouth daily.  Marland Kitchen Spacer/Aero-Holding Chambers (AEROCHAMBER MV) inhaler Use as instructed  . spironolactone (ALDACTONE) 100 MG tablet TAKE 1 TABLET BY MOUTH EVERY DAY  . SUPREP BOWEL PREP KIT 17.5-3.13-1.6 GM/177ML SOLN Take 1 kit by mouth as directed. For colonoscopy prep  . SYMBICORT 160-4.5 MCG/ACT inhaler INHALE 2 PUFFS INTO LUNGS 2 TIMES DAILY AS DIRECTED  . torsemide (DEMADEX) 20 MG tablet Take 1 tablet (20 mg total) by mouth daily.  . valsartan (DIOVAN) 320 MG tablet TAKE 1 TABLET BY MOUTH EVERY DAY    Allergies:   Aspirin, Doxycycline, Kiwi extract, Metoprolol, Penicillins, Strawberry extract, and Sulfamethoxazole-trimethoprim   Social History   Socioeconomic History  . Marital status: Single    Spouse name: Not on file  . Number of children: 3  . Years of education: Not on file  . Highest education level: Not on file  Occupational History    Employer: UNEMPLOYED  Tobacco Use  . Smoking status: Never Smoker  . Smokeless tobacco: Never Used  Substance and Sexual Activity  . Alcohol use: Yes    Alcohol/week: 0.0 standard drinks    Comment: occasionally/socially  . Drug use: No  . Sexual activity: Yes    Birth  control/protection: Surgical  Other Topics Concern  . Not on file  Social History Narrative   She lives with four children.   She is currently not working (disbaility pending).      Social Determinants of Health   Financial Resource Strain:   . Difficulty of Paying Living Expenses: Not on file  Food Insecurity:   . Worried About Charity fundraiser in the Last Year: Not on file  . Ran Out of Food in the Last Year: Not on file  Transportation Needs:   . Lack of Transportation (Medical): Not on file  . Lack of Transportation (Non-Medical): Not on file  Physical Activity:   . Days of Exercise per Week: Not on file  . Minutes of Exercise per Session: Not on file  Stress:   . Feeling of Stress : Not on file  Social Connections:   . Frequency of Communication with Friends and Family: Not on file  . Frequency of Social Gatherings with Friends and Family: Not on file  . Attends Religious Services: Not on file  . Active Member of Clubs or Organizations: Not on file  . Attends Archivist Meetings: Not on file  . Marital Status: Not on file    Family History: The patient's family history includes Breast cancer in some other family members; Diabetes in her maternal grandmother; Fibroids (age of onset: 58) in her sister; Heart disease in her maternal grandmother; Hypertension in her maternal grandmother; Other (age of onset: 69) in her mother. There is no history of Cancer, Alcohol abuse, Early death, Hyperlipidemia, Kidney disease, Stroke, Colon cancer, Esophageal cancer, Stomach cancer, or Rectal cancer.  ROS:   Please see the history of present illness.     All other systems reviewed and are negative.  EKGs/Labs/Other Studies Reviewed:    The following studies were reviewed today:  Nuclear stress test: 02/15/2013 Impression Exercise Capacity: Lexiscan with no exercise.  BP Response: Hypertensive blood pressure response.  Clinical Symptoms: Chest pain, headache.  ECG  Impression: No significant ST segment change suggestive of ischemia.  Comparison with Prior Nuclear Study: No images to  compare  Overall Impression: Normal stress nuclear study.  LV Ejection Fraction: 54%. LV Wall Motion: NL LV Function; NL Wall Motion  Loralie Champagne  02/12/2013  TTE 06/17/2013 Left ventricle: The cavity size was normal. There was mild concentric hypertrophy. Systolic function was normal. The estimated ejection fraction was in the range of 60% to 65%. Wall motion was normal; there were no regional wall motion abnormalities. Features are consistent with a pseudonormal left ventricular filling pattern, with concomitant abnormal relaxation and increased filling pressure (grade 2 diastolic dysfunction). Doppler parameters are consistent with high ventricular filling pressure. Impressions: - When compared to 2010, EF has improved.  TTE: 09/18/15 - Left ventricle: The cavity size was normal. Wall thickness was increased in a pattern of severe LVH. Systolic function was vigorous. The estimated ejection fraction was in the range of 65% to 70%. Doppler parameters are consistent with abnormal left ventricular relaxation (grade 1 diastolic dysfunction).  TTE: 03/2018 - Left ventricle: The cavity size was normal. Wall thickness was   increased in a pattern of moderate LVH. Systolic function was   mildly reduced. The estimated ejection fraction was in the range   of 45% to 50%. Wall motion was normal; there were no regional   wall motion abnormalities. Features are consistent with a   pseudonormal left ventricular filling pattern, with concomitant   abnormal relaxation and increased filling pressure (grade 2   diastolic dysfunction). - Mitral valve: There was mild regurgitation. - Pulmonary arteries: Systolic pressure was mildly increased. PA   peak pressure: 33 mm Hg (S).  EKG:  EKG is not ordered today.   Recent Labs: 12/09/2018: ALT 27; BUN 16; Creatinine  1.27; Hemoglobin 13.4; Platelet Count 203; Potassium 3.1; Sodium 143   Recent Lipid Panel    Component Value Date/Time   CHOL 198 10/01/2018 1115   TRIG 113.0 10/01/2018 1115   HDL 45.50 10/01/2018 1115   CHOLHDL 4 10/01/2018 1115   VLDL 22.6 10/01/2018 1115   LDLCALC 130 (H) 10/01/2018 1115   LDLDIRECT 152.0 03/30/2018 1604    Physical Exam:    VS:  BP (!) 176/116   Pulse 61   Ht '5\' 6"'  (1.676 m)   Wt 209 lb 12.8 oz (95.2 kg)   SpO2 98%   BMI 33.86 kg/m     Wt Readings from Last 3 Encounters:  04/23/19 209 lb 12.8 oz (95.2 kg)  04/14/19 206 lb (93.4 kg)  03/10/19 206 lb (93.4 kg)    Physical Exam  Constitutional: She is oriented to person, place, and time. She appears well-developed and well-nourished. No distress.  Cental obesity  HENT:  Head: Normocephalic and atraumatic.  Neck: No JVD present.  Cardiovascular: Normal rate, regular rhythm, normal heart sounds and intact distal pulses. Exam reveals no gallop and no friction rub.  No murmur heard. Pulmonary/Chest: Effort normal and breath sounds normal. No respiratory distress. She has no wheezes. She has no rales.  Abdominal: Soft. Bowel sounds are normal.  Musculoskeletal:        General: No deformity or edema. Normal range of motion.     Cervical back: Normal range of motion and neck supple.  Neurological: She is alert and oriented to person, place, and time.  Skin: Skin is warm and dry.  Psychiatric: She has a normal mood and affect. Her behavior is normal. Judgment and thought content normal.  Vitals reviewed.   ASSESSMENT:    1. Hypertensive heart disease with heart failure ([place])   2. Resistant hypertension  3. Acute on chronic combined systolic and diastolic CHF (congestive heart failure) ([place])   4. OSA (obstructive sleep apnea)   5. Pseudotumor cerebri syndrome    PLAN:    In order of problems listed above:  Hypertensive heart disease, severe resistant hypertension -Long history of difficult to  control hypertension blood pressure - No stenosis on renal artery ultrasound, normal renal parenchyma 03/2019, kidney cyst -She is currently on Spironolactone 100 mg daily, clonidine 0.3 mg daily, Cardizem 420 mg daily, hydralazine 100 mg 3 times a day and valsartan 320 mg daily, Nebivolol 40 mg po daily. -Outreach to the hypertension clinic for any further advice. -She now has a new LV dysfunction most probably secondary to poorly controlled blood pressure. - we will obtain renin/aldosterone ratio  Chronic combined systolic diastolic CHF, LVEF 45 to 63%, previously 60%, NYHA class III -Secondary to poorly controlled hypertension. -She is on valsartan and Bystolic, hydralazine, did not tolerate Imdur secondary to headaches (probably sec to underlying pseudotumor cerebri) -we will order left and right cardiac cath by Dr Haroldine Laws  Chest pain -I suspect this is microvascular disease from poorly controlled hypertension, she was recently started on rosuvastatin - she was scheduled for coronary CTA but was too tachycardic, also developed allergic reaction to metoprolol - we will order left and right cardiac cath  OSA, with desaturation at night down to 70' - On CPAP, however does not use it as she does not tolerated well. We arrange to be be tested fpr BiPAP ASAP.  Medication Adjustments/Labs and Tests Ordered: Current medicines are reviewed at length with the patient today.  Concerns regarding medicines are outlined above. Labs and tests ordered and medication changes are outlined in the patient instructions below:  Patient Instructions  Medication Instructions:   Your physician recommends that you continue on your current medications as directed. Please refer to the Current Medication list given to you today.  *If you need a refill on your cardiac medications before your next appointment, please call your pharmacy*    Testing/Procedures:  Your physician has requested that you have a  cardiac catheterization. Cardiac catheterization is used to diagnose and/or treat various heart conditions. Doctors may recommend this procedure for a number of different reasons. The most common reason is to evaluate chest pain. Chest pain can be a symptom of coronary artery disease (CAD), and cardiac catheterization can show whether plaque is narrowing or blocking your heart's arteries. This procedure is also used to evaluate the valves, as well as measure the blood flow and oxygen levels in different parts of your heart. For further information please visit [email or web address]. Please follow instruction sheet, as given.  YOU WILL NEED TO BE SCHEDULED TO HAVE A RIGHT AND LEFT HEART CATH FOR DR. Missy Sabins TO DO.  WE WILL CALL YOU TO HAVE THIS ARRANGED ONCE HIS NURSE GETS BACK WITH DATES HE CAN DO THIS.  WE WILL ARRANGE YOUR LABS AT THAT TIME AS WELL.   You have been referred to [place] SEE DR. [place].  DR. [place]'S NURSE WILL BE IN CONTACT WITH YOU SOON TO ARRANGE THIS APPOINTMENT   Follow-Up: At [facility], you and your health needs are our priority.  As part of our continuing mission to provide you with exceptional heart care, we have created designated Provider Care Teams.  These Care Teams include your primary Cardiologist (physician) and Advanced Practice Providers (APPs -  Physician Assistants and Nurse Practitioners) who all work together to  provide you with the care you need, when you need it.  Your next appointment:   2 month(s)--3/17/21AT 10:20 AM WITH DR. Meda Coffee IN THE CLINIC  The format for your next appointment:   In Person  Provider:   Ena Dawley, MD      Signed, Ena Dawley, MD  04/23/2019 5:57 PM    [place]

## 2019-04-23 NOTE — Telephone Encounter (Signed)
-----   Message from Nuala Alpha, LPN sent at [phone]  4:23 PM EST ----- Regarding: refer to HTN clinic with Oval Linsey Dr. Meda Coffee saw this pt in clinic today, and wants to refer her to Dr. Oval Linsey in your new HTN clinic.  Can you please help with this? Pt is aware you guys will call to arrange this appt.  Thanks so much, [employer]

## 2019-04-26 ENCOUNTER — Ambulatory Visit (INDEPENDENT_AMBULATORY_CARE_PROVIDER_SITE_OTHER): Payer: Medicare Other | Admitting: Family Medicine

## 2019-04-26 ENCOUNTER — Other Ambulatory Visit: Payer: Self-pay

## 2019-04-26 ENCOUNTER — Encounter: Payer: Self-pay | Admitting: Family Medicine

## 2019-04-26 DIAGNOSIS — M25571 Pain in right ankle and joints of right foot: Secondary | ICD-10-CM | POA: Diagnosis not present

## 2019-04-26 NOTE — Assessment & Plan Note (Addendum)
May have a component of impingement as she tends to supinate when she walks. -Orthotics today. - counseled on supportive care  -May need some metatarsal pads that she does have some hammertoe line. -May need some lateral wedges to get her back to neutral.

## 2019-04-26 NOTE — Progress Notes (Signed)
Erica [name] - 44 y.o. female MRN [id number]  Date of birth: 07-24-1975  SUBJECTIVE:  Including CC & ROS.  Chief Complaint  Patient presents with  . Foot Orthotics    Erica [name] is a 44 y.o. female that is presenting with ankle pain in each ankle.  She is wanting to get back to exercise as she has a history of heart failure.    Review of Systems See HPI   HISTORY: Past Medical, Surgical, Social, and Family History Reviewed & Updated per EMR.   Pertinent Historical Findings include:  Past Medical History:  Diagnosis Date  . Allergy   . Anxiety   . Arthritis   . Asthma   . Blood transfusion without reported diagnosis   . Breast cancer ([place]) 2010   a. locally advanced left breast carcinoma diagnosed in 2010 and treated with neoadjuvant chemotherapy with docetaxel and cyclophosphamide as well as paclitaxel. This was followed by radiation therapy which was completed in 2011.  Marland Kitchen CHF (congestive heart failure) ([place])   . Chronic kidney disease   . Dyspnea on exertion    a. 01/2013 Lexi MV: EF 54%, no ischemia/infarct;  b. 05/2013 Echo: EF 60-65%, no rwma, Gr 2 DD;  b.   . Fatty liver   . Gastroparesis   . GERD (gastroesophageal reflux disease)   . Hypertension   . Impaired glucose tolerance 04/13/2011  . Lymphadenitis, chronic    restricted LEFT extremity  . 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 OSA (obstructive sleep apnea) 05/04/2014   AHI 31/hr now on CPAP at 12cm H2O  . Oxygen deficiency    Uses 02 in CPAP  . Personal history of chemotherapy 2010  . Personal history of radiation therapy 2010  . Sleep apnea     Past Surgical History:  Procedure Laterality Date  . BRAIN SURGERY     Shunt  . BREAST LUMPECTOMY Left 09/2008  . CARPAL TUNNEL RELEASE Left 2011  . LYMPH NODE DISSECTION Left 2010   breast; 2 wks after breast lumpectomy  . TENDON RELEASE Right 2012   "hand"  . TUBAL LIGATION  05/11/2011   Procedure: POST PARTUM TUBAL LIGATION;  Surgeon: Emeterio Reeve,  MD;  Location: [facility];  Service: Gynecology;  Laterality: Bilateral;  Induced for HTN    Allergies  Allergen Reactions  . Aspirin Shortness Of Breath and Palpitations    Pt can take ibuprofen without reaction  . Doxycycline Anaphylaxis  . Kiwi Extract Itching and Swelling    Lip swelling  . Metoprolol Hives and Itching  . Penicillins Shortness Of Breath and Palpitations    Has patient had a PCN reaction causing immediate rash, facial/tongue/throat swelling, SOB or lightheadedness with hypotension: Yes Has patient had a PCN reaction causing severe rash involving mucus membranes or skin necrosis: Yes Has patient had a PCN reaction that required hospitalization Yes Has patient had a PCN reaction occurring within the last 10 years: No If all of the above answers are "NO", then may proceed with Cephalosporin use.  . Strawberry Extract Hives  . Sulfamethoxazole-Trimethoprim Anaphylaxis    Facial, throat and tongue swelling with difficulty swallowing.    Family History  Problem Relation Age of Onset  . Other Mother 1       breast calcifications treated with surgery, breast cancer pill, and radiation  . Fibroids Sister 47       s/p TAH-BSO  . Diabetes Maternal Grandmother   . Heart disease Maternal Grandmother   .  Hypertension Maternal Grandmother   . Breast cancer Other        maternal great aunt (MGM's sister)  . Breast cancer Other        paternal great aunt dx middle ages; s/p mastectomy  . Cancer Neg Hx   . Alcohol abuse Neg Hx   . Early death Neg Hx   . Hyperlipidemia Neg Hx   . Kidney disease Neg Hx   . Stroke Neg Hx   . Colon cancer Neg Hx   . Esophageal cancer Neg Hx   . Stomach cancer Neg Hx   . Rectal cancer Neg Hx      Social History   Socioeconomic History  . Marital status: Single    Spouse name: Not on file  . Number of children: 3  . Years of education: Not on file  . Highest education level: Not on file  Occupational History    Employer: UNEMPLOYED    Tobacco Use  . Smoking status: Never Smoker  . Smokeless tobacco: Never Used  Substance and Sexual Activity  . Alcohol use: Yes    Alcohol/week: 0.0 standard drinks    Comment: occasionally/socially  . Drug use: No  . Sexual activity: Yes    Birth control/protection: Surgical  Other Topics Concern  . Not on file  Social History Narrative   She lives with four children.   She is currently not working (disbaility pending).      Social Determinants of Health   Financial Resource Strain:   . Difficulty of Paying Living Expenses: Not on file  Food Insecurity:   . Worried About Charity fundraiser in the Last Year: Not on file  . Ran Out of Food in the Last Year: Not on file  Transportation Needs:   . Lack of Transportation (Medical): Not on file  . Lack of Transportation (Non-Medical): Not on file  Physical Activity:   . Days of Exercise per Week: Not on file  . Minutes of Exercise per Session: Not on file  Stress:   . Feeling of Stress : Not on file  Social Connections:   . Frequency of Communication with Friends and Family: Not on file  . Frequency of Social Gatherings with Friends and Family: Not on file  . Attends Religious Services: Not on file  . Active Member of Clubs or Organizations: Not on file  . Attends Archivist Meetings: Not on file  . Marital Status: Not on file  Intimate Partner Violence:   . Fear of Current or Ex-Partner: Not on file  . Emotionally Abused: Not on file  . Physically Abused: Not on file  . Sexually Abused: Not on file     PHYSICAL EXAM:  VS: Ht 5\' 5"  (1.651 m)   Wt 205 lb (93 kg)   BMI 34.11 kg/m  Physical Exam Gen: NAD, alert, cooperative with exam, well-appearing ENT: normal lips, normal nasal mucosa,  Eye: normal EOM, normal conjunctiva and lids Skin: no rashes, no areas of induration  Neuro: normal tone, normal sensation to touch Psych:  normal insight, alert and oriented MSK:  Right and left foot: Some hammertoe  in occurring. Seems to be more supinated then pronated with ambulation. No obvious swelling or ecchymosis. Neurovascularly intact  Patient was fitted for a standard, cushioned, semi-rigid orthotic. The orthotic was heated and afterward the patient stood on the orthotic blank positioned on the orthotic stand. The patient was positioned in subtalar neutral position and 10 degrees of ankle  dorsiflexion in a weight bearing stance. After completion of molding, a stable base was applied to the orthotic blank. The blank was ground to a stable position for weight bearing. Size: 49F Base: Blue EVA Additional Posting and Padding: None The patient ambulated these, and they were very comfortable.    ASSESSMENT & PLAN:   Right ankle pain May have a component of impingement as she tends to supinate when she walks. -Orthotics today. - counseled on supportive care  -May need some metatarsal pads that she does have some hammertoe line. -May need some lateral wedges to get her back to neutral.

## 2019-04-27 ENCOUNTER — Ambulatory Visit: Payer: Medicare Other | Admitting: Internal Medicine

## 2019-04-27 ENCOUNTER — Encounter: Payer: Self-pay | Admitting: *Deleted

## 2019-04-27 ENCOUNTER — Telehealth: Payer: Self-pay | Admitting: *Deleted

## 2019-04-27 NOTE — Telephone Encounter (Signed)
-----   Message from Freada Bergeron, [place] sent at 04/23/2019  4:18 PM EST ----- Regarding: precert pt has new insurance BiPAP Titration

## 2019-04-27 NOTE — Telephone Encounter (Signed)
Had scheduled patient for Friday 1/29. Patient needs to reschedule secondary to having cath.  Will call patient once Feb HTN clinic available

## 2019-04-27 NOTE — Telephone Encounter (Signed)
Called the pt and got her right and left cath scheduled, as Dr. Meda Coffee advised at the pts last OV with her on 1/22.  Pts right and left cardiac cath is scheduled for 05/04/19 at 0730 with Dr. Missy Sabins to do. Pt will come to our office for pre-cath labs and other labs Dr. Meda Coffee ordered on her for this Friday 1/29 around 10 am. Pt will then go to get her COVID PRE-SCREENING done thereafter on 1/29 at 1110.  Pt is aware that she will have to quarantine thereafter, until her cath date on 05/04/19.  Pt is aware that I will send her cath instruction letter via to her mychart account, as well as I will leave a printed copy down stairs at the elevator for her to pick up when she reports to our lab on 04/30/19.  Message will be sent to pre-cert and to Dr. Meda Coffee to place hospital orders.  Will also CC are Nurse Navigator Yetta Glassman. Pt verbalized understanding and agrees with this plan.

## 2019-04-27 NOTE — Progress Notes (Signed)
Cardiac cath instructions written and sent to pts mychart as well as she will pick a hard copy up on 1/29, when she reports to the lab at our office.

## 2019-04-28 ENCOUNTER — Ambulatory Visit: Payer: Medicare Other

## 2019-04-28 ENCOUNTER — Other Ambulatory Visit: Payer: Self-pay

## 2019-04-28 ENCOUNTER — Telehealth: Payer: Self-pay | Admitting: *Deleted

## 2019-04-28 ENCOUNTER — Encounter: Payer: Self-pay | Admitting: *Deleted

## 2019-04-28 ENCOUNTER — Inpatient Hospital Stay: Payer: Medicare Other | Attending: Oncology

## 2019-04-28 VITALS — BP 159/98 | HR 68 | Temp 98.2°F | Resp 18

## 2019-04-28 DIAGNOSIS — C50412 Malignant neoplasm of upper-outer quadrant of left female breast: Secondary | ICD-10-CM | POA: Diagnosis present

## 2019-04-28 DIAGNOSIS — Z79818 Long term (current) use of other agents affecting estrogen receptors and estrogen levels: Secondary | ICD-10-CM | POA: Insufficient documentation

## 2019-04-28 MED ORDER — GOSERELIN ACETATE 3.6 MG ~~LOC~~ IMPL
DRUG_IMPLANT | SUBCUTANEOUS | Status: AC
  Filled 2019-04-28: qty 3.6

## 2019-04-28 MED ORDER — GOSERELIN ACETATE 3.6 MG ~~LOC~~ IMPL
3.6000 mg | DRUG_IMPLANT | SUBCUTANEOUS | Status: DC
  Administered 2019-04-28: 3.6 mg via SUBCUTANEOUS

## 2019-04-28 NOTE — Telephone Encounter (Signed)
-----   Message from Freada Bergeron, [place] sent at 04/23/2019  4:18 PM EST ----- Regarding: precert pt has new insurance BiPAP Titration

## 2019-04-28 NOTE — Patient Instructions (Signed)

## 2019-04-28 NOTE — Telephone Encounter (Signed)
Staff message sent to Gae Bon per [facility] web portal no PA is required. Ok to schedule BIPAP titration. Decision ID [id number].

## 2019-04-28 NOTE — Addendum Note (Signed)
Addended by: [name] on: 04/28/2019 05:14 PM   Modules accepted: Orders, SmartSet

## 2019-04-28 NOTE — Telephone Encounter (Signed)
Letter printed off and will be taken downstairs for the pt to pick up, when she reports to the office on 1/29 for pre-cath labs.

## 2019-04-29 NOTE — Telephone Encounter (Signed)
Patient rescheduled

## 2019-04-30 ENCOUNTER — Other Ambulatory Visit: Payer: Self-pay

## 2019-04-30 ENCOUNTER — Encounter (HOSPITAL_BASED_OUTPATIENT_CLINIC_OR_DEPARTMENT_OTHER): Payer: Self-pay

## 2019-04-30 ENCOUNTER — Other Ambulatory Visit (HOSPITAL_COMMUNITY): Payer: Medicaid - Out of State

## 2019-04-30 ENCOUNTER — Other Ambulatory Visit: Payer: Medicare Other | Admitting: *Deleted

## 2019-04-30 ENCOUNTER — Inpatient Hospital Stay (HOSPITAL_COMMUNITY): Admission: RE | Admit: 2019-04-30 | Payer: Medicare Other | Source: Ambulatory Visit

## 2019-04-30 ENCOUNTER — Other Ambulatory Visit (HOSPITAL_COMMUNITY)
Admission: RE | Admit: 2019-04-30 | Discharge: 2019-04-30 | Disposition: A | Discharge status: Home / Self Care | Payer: Medicare Other | Source: Ambulatory Visit | Attending: Internal Medicine | Admitting: Internal Medicine

## 2019-04-30 ENCOUNTER — Ambulatory Visit: Payer: Medicare Other | Admitting: Cardiovascular Disease

## 2019-04-30 ENCOUNTER — Telehealth: Payer: Self-pay | Admitting: *Deleted

## 2019-04-30 DIAGNOSIS — Z20822 Contact with and (suspected) exposure to covid-19: Secondary | ICD-10-CM | POA: Diagnosis not present

## 2019-04-30 DIAGNOSIS — I1A0 Resistant hypertension: Secondary | ICD-10-CM

## 2019-04-30 DIAGNOSIS — G4733 Obstructive sleep apnea (adult) (pediatric): Secondary | ICD-10-CM

## 2019-04-30 DIAGNOSIS — Z01812 Encounter for preprocedural laboratory examination: Secondary | ICD-10-CM | POA: Insufficient documentation

## 2019-04-30 DIAGNOSIS — I5043 Acute on chronic combined systolic (congestive) and diastolic (congestive) heart failure: Secondary | ICD-10-CM

## 2019-04-30 DIAGNOSIS — I10 Essential (primary) hypertension: Secondary | ICD-10-CM

## 2019-04-30 DIAGNOSIS — G932 Benign intracranial hypertension: Secondary | ICD-10-CM

## 2019-04-30 DIAGNOSIS — I110 Hypertensive heart disease with heart failure: Secondary | ICD-10-CM

## 2019-04-30 LAB — SARS CORONAVIRUS 2 (TAT 6-24 HRS): SARS Coronavirus 2: NEGATIVE

## 2019-04-30 NOTE — Telephone Encounter (Signed)
Patient is scheduled for BiPAP Titration on 05/13/19. Ptis scheduled for COVID screening on 05/10/19 2:45 prior to titration.  Patient understands his titration study will be done at [facility] sleep lab. Patient understands he will receive a letter in a week or so detailing appointment, date, time, and location. Patient understands to call if he does not receive the letter  in a timely manner. Patient agrees with treatment and thanked me for call.

## 2019-04-30 NOTE — Telephone Encounter (Signed)
-----   Message from Lauralee Evener, [place] sent at 04/28/2019  4:40 PM EST ----- Regarding: RE: precert pt has new insurance Per [facility] web portal no PA is required. Ok to schedule BIPAP titration. Decision [id number]. ----- Message ----- From: Freada Bergeron, CMA Sent: 04/23/2019   4:18 PM EST To: Freada Bergeron, CMA, C[facility] Div Sleep Studies Subject: precert pt has new insurance                   BiPAP Titration

## 2019-05-03 ENCOUNTER — Telehealth: Payer: Self-pay | Admitting: *Deleted

## 2019-05-03 ENCOUNTER — Ambulatory Visit: Payer: Medicare Other | Admitting: Internal Medicine

## 2019-05-03 ENCOUNTER — Other Ambulatory Visit: Payer: Self-pay | Admitting: Internal Medicine

## 2019-05-03 NOTE — Telephone Encounter (Signed)
Pt contacted pre-catheterization scheduled at [facility] for: Tuesday May 04, 2019 7:30 AM Verified arrival time and place: [facility]) at: 5:30 AM   No solid food after midnight prior to cath, clear liquids until 5 AM day of procedure. Contrast allergy: no  Hold: Torsemide/KCl- AM of procedure Spironolactone-AM of procedure.  AM meds can be  taken pre-cath with sip of water including: Pt is allergic to ASA- Dr. Meda Coffee is aware and its ok not to take for this procedure is more diagnostic.     Confirmed patient has responsible adult to drive home post procedure and observe 24 hours after arriving home: yes  Currently, due to Covid-19 pandemic, only one person will be allowed with patient. Must be the same person for patient's entire stay and will be required to wear a mask. They will be asked to wait in the waiting room for the duration of the patient's stay.  Patients are required to wear a mask when they enter the hospital.      COVID-19 Pre-Screening Questions:  . In the past 7 to 10 days have you had a cough,  shortness of breath, headache, congestion, fever (100 or greater) body aches, chills, sore throat, or sudden loss of taste or sense of smell? Shortness of breath, not new in 7-10 days, denies fever . Have you been around anyone with known Covid 19? no . Have you been around anyone who is awaiting Covid 19 test results in the past 7 to 10 days? no . Have you been around anyone who has been exposed to Covid 19, or has mentioned symptoms of Covid 19 within the past 7 to 10 days? no   I reviewed procedure/mask/visitor instructions, Covid-19 screening questions with patient, she verbalized understanding, thanked me for call.

## 2019-05-04 ENCOUNTER — Other Ambulatory Visit: Payer: Self-pay

## 2019-05-04 ENCOUNTER — Encounter (HOSPITAL_COMMUNITY)
Admission: RE | Disposition: A | Discharge status: Home / Self Care | Payer: Medicare Other | Source: Home / Self Care | Attending: Internal Medicine

## 2019-05-04 ENCOUNTER — Encounter (HOSPITAL_COMMUNITY): Payer: Medicare Other

## 2019-05-04 ENCOUNTER — Ambulatory Visit (HOSPITAL_COMMUNITY): Admit: 2019-05-04 | Payer: Medicaid - Out of State | Admitting: Internal Medicine

## 2019-05-04 ENCOUNTER — Ambulatory Visit (HOSPITAL_COMMUNITY)
Admission: RE | Admit: 2019-05-04 | Discharge: 2019-05-04 | Disposition: A | Discharge status: Home / Self Care | Payer: Medicare Other | Attending: Internal Medicine | Admitting: Internal Medicine

## 2019-05-04 DIAGNOSIS — Z880 Allergy status to penicillin: Secondary | ICD-10-CM | POA: Diagnosis not present

## 2019-05-04 DIAGNOSIS — Z833 Family history of diabetes mellitus: Secondary | ICD-10-CM | POA: Diagnosis not present

## 2019-05-04 DIAGNOSIS — I5031 Acute diastolic (congestive) heart failure: Secondary | ICD-10-CM | POA: Diagnosis not present

## 2019-05-04 DIAGNOSIS — G932 Benign intracranial hypertension: Secondary | ICD-10-CM | POA: Diagnosis not present

## 2019-05-04 DIAGNOSIS — E669 Obesity, unspecified: Secondary | ICD-10-CM | POA: Insufficient documentation

## 2019-05-04 DIAGNOSIS — J45909 Unspecified asthma, uncomplicated: Secondary | ICD-10-CM | POA: Insufficient documentation

## 2019-05-04 DIAGNOSIS — I110 Hypertensive heart disease with heart failure: Secondary | ICD-10-CM | POA: Diagnosis present

## 2019-05-04 DIAGNOSIS — N189 Chronic kidney disease, unspecified: Secondary | ICD-10-CM | POA: Insufficient documentation

## 2019-05-04 DIAGNOSIS — Z8249 Family history of ischemic heart disease and other diseases of the circulatory system: Secondary | ICD-10-CM | POA: Insufficient documentation

## 2019-05-04 DIAGNOSIS — K760 Fatty (change of) liver, not elsewhere classified: Secondary | ICD-10-CM | POA: Insufficient documentation

## 2019-05-04 DIAGNOSIS — I5032 Chronic diastolic (congestive) heart failure: Secondary | ICD-10-CM | POA: Diagnosis present

## 2019-05-04 DIAGNOSIS — Z982 Presence of cerebrospinal fluid drainage device: Secondary | ICD-10-CM | POA: Insufficient documentation

## 2019-05-04 DIAGNOSIS — Z7951 Long term (current) use of inhaled steroids: Secondary | ICD-10-CM | POA: Insufficient documentation

## 2019-05-04 DIAGNOSIS — I701 Atherosclerosis of renal artery: Secondary | ICD-10-CM | POA: Diagnosis not present

## 2019-05-04 DIAGNOSIS — I5043 Acute on chronic combined systolic (congestive) and diastolic (congestive) heart failure: Secondary | ICD-10-CM | POA: Diagnosis not present

## 2019-05-04 DIAGNOSIS — R0609 Other forms of dyspnea: Secondary | ICD-10-CM | POA: Insufficient documentation

## 2019-05-04 DIAGNOSIS — I130 Hypertensive heart and chronic kidney disease with heart failure and stage 1 through stage 4 chronic kidney disease, or unspecified chronic kidney disease: Secondary | ICD-10-CM | POA: Diagnosis not present

## 2019-05-04 DIAGNOSIS — G4733 Obstructive sleep apnea (adult) (pediatric): Secondary | ICD-10-CM | POA: Diagnosis not present

## 2019-05-04 DIAGNOSIS — Z6833 Body mass index (BMI) 33.0-33.9, adult: Secondary | ICD-10-CM | POA: Insufficient documentation

## 2019-05-04 DIAGNOSIS — M1990 Unspecified osteoarthritis, unspecified site: Secondary | ICD-10-CM | POA: Insufficient documentation

## 2019-05-04 DIAGNOSIS — Z881 Allergy status to other antibiotic agents status: Secondary | ICD-10-CM | POA: Diagnosis not present

## 2019-05-04 DIAGNOSIS — Z79899 Other long term (current) drug therapy: Secondary | ICD-10-CM | POA: Diagnosis not present

## 2019-05-04 DIAGNOSIS — K219 Gastro-esophageal reflux disease without esophagitis: Secondary | ICD-10-CM | POA: Diagnosis not present

## 2019-05-04 DIAGNOSIS — I2510 Atherosclerotic heart disease of native coronary artery without angina pectoris: Secondary | ICD-10-CM | POA: Insufficient documentation

## 2019-05-04 DIAGNOSIS — I5033 Acute on chronic diastolic (congestive) heart failure: Secondary | ICD-10-CM | POA: Diagnosis present

## 2019-05-04 DIAGNOSIS — Z886 Allergy status to analgesic agent status: Secondary | ICD-10-CM | POA: Insufficient documentation

## 2019-05-04 DIAGNOSIS — Z9981 Dependence on supplemental oxygen: Secondary | ICD-10-CM | POA: Insufficient documentation

## 2019-05-04 DIAGNOSIS — I10 Essential (primary) hypertension: Secondary | ICD-10-CM

## 2019-05-04 HISTORY — PX: RIGHT/LEFT HEART CATH AND CORONARY ANGIOGRAPHY: CATH118266

## 2019-05-04 LAB — POCT I-STAT 7, (LYTES, BLD GAS, ICA,H+H)
Acid-Base Excess: 3 mmol/L — ABNORMAL HIGH (ref 0.0–2.0)
Bicarbonate: 29.5 mmol/L — ABNORMAL HIGH (ref 20.0–28.0)
Calcium, Ion: 1.15 mmol/L (ref 1.15–1.40)
HCT: 37 % (ref 36.0–46.0)
Hemoglobin: 12.6 g/dL (ref 12.0–15.0)
O2 Saturation: 98 %
Potassium: 3.2 mmol/L — ABNORMAL LOW (ref 3.5–5.1)
Sodium: 143 mmol/L (ref 135–145)
TCO2: 31 mmol/L (ref 22–32)
pCO2 arterial: 50.2 mmHg — ABNORMAL HIGH (ref 32.0–48.0)
pH, Arterial: 7.377 (ref 7.350–7.450)
pO2, Arterial: 119 mmHg — ABNORMAL HIGH (ref 83.0–108.0)

## 2019-05-04 LAB — POCT I-STAT EG7
Acid-Base Excess: 3 mmol/L — ABNORMAL HIGH (ref 0.0–2.0)
Acid-Base Excess: 3 mmol/L — ABNORMAL HIGH (ref 0.0–2.0)
Bicarbonate: 29.7 mmol/L — ABNORMAL HIGH (ref 20.0–28.0)
Bicarbonate: 30.4 mmol/L — ABNORMAL HIGH (ref 20.0–28.0)
Calcium, Ion: 1.12 mmol/L — ABNORMAL LOW (ref 1.15–1.40)
Calcium, Ion: 1.19 mmol/L (ref 1.15–1.40)
HCT: 37 % (ref 36.0–46.0)
HCT: 38 % (ref 36.0–46.0)
Hemoglobin: 12.6 g/dL (ref 12.0–15.0)
Hemoglobin: 12.9 g/dL (ref 12.0–15.0)
O2 Saturation: 72 %
O2 Saturation: 73 %
Potassium: 3.1 mmol/L — ABNORMAL LOW (ref 3.5–5.1)
Potassium: 3.3 mmol/L — ABNORMAL LOW (ref 3.5–5.1)
Sodium: 143 mmol/L (ref 135–145)
Sodium: 144 mmol/L (ref 135–145)
TCO2: 31 mmol/L (ref 22–32)
TCO2: 32 mmol/L (ref 22–32)
pCO2, Ven: 54.2 mmHg (ref 44.0–60.0)
pCO2, Ven: 55.4 mmHg (ref 44.0–60.0)
pH, Ven: 7.347 (ref 7.250–7.430)
pH, Ven: 7.347 (ref 7.250–7.430)
pO2, Ven: 41 mmHg (ref 32.0–45.0)
pO2, Ven: 41 mmHg (ref 32.0–45.0)

## 2019-05-04 SURGERY — RIGHT/LEFT HEART CATH AND CORONARY ANGIOGRAPHY
Anesthesia: LOCAL

## 2019-05-04 SURGERY — COLONOSCOPY WITH PROPOFOL
Anesthesia: Monitor Anesthesia Care

## 2019-05-04 MED ORDER — SODIUM CHLORIDE 0.9 % IV SOLN: INTRAVENOUS | Status: DC

## 2019-05-04 MED ORDER — SODIUM CHLORIDE 0.9% FLUSH: 3.0000 mL | Freq: Two times a day (BID) | INTRAVENOUS | Status: DC

## 2019-05-04 MED ORDER — VERAPAMIL HCL 2.5 MG/ML IV SOLN
INTRAVENOUS | Status: AC
  Filled 2019-05-04: qty 2

## 2019-05-04 MED ORDER — HEPARIN SODIUM (PORCINE) 1000 UNIT/ML IJ SOLN
INTRAMUSCULAR | Status: AC
  Filled 2019-05-04: qty 1

## 2019-05-04 MED ORDER — HYDRALAZINE HCL 50 MG PO TABS
100.0000 mg | ORAL_TABLET | ORAL | Status: DC
  Filled 2019-05-04: qty 2

## 2019-05-04 MED ORDER — MIDAZOLAM HCL 2 MG/2ML IJ SOLN
INTRAMUSCULAR | Status: DC | PRN
  Administered 2019-05-04 (×2): 1 mg via INTRAVENOUS

## 2019-05-04 MED ORDER — IOHEXOL 350 MG/ML SOLN
INTRAVENOUS | Status: DC | PRN
  Administered 2019-05-04: 140 mL

## 2019-05-04 MED ORDER — HEPARIN (PORCINE) IN NACL 1000-0.9 UT/500ML-% IV SOLN
INTRAVENOUS | Status: DC | PRN
  Administered 2019-05-04: 500 mL

## 2019-05-04 MED ORDER — LIDOCAINE HCL (PF) 1 % IJ SOLN
INTRAMUSCULAR | Status: AC
  Filled 2019-05-04: qty 30

## 2019-05-04 MED ORDER — HEPARIN SODIUM (PORCINE) 1000 UNIT/ML IJ SOLN
INTRAMUSCULAR | Status: DC | PRN
  Administered 2019-05-04: 4500 [IU] via INTRAVENOUS

## 2019-05-04 MED ORDER — SODIUM CHLORIDE 0.9% FLUSH: 3.0000 mL | INTRAVENOUS | Status: DC | PRN

## 2019-05-04 MED ORDER — MIDAZOLAM HCL 2 MG/2ML IJ SOLN
INTRAMUSCULAR | Status: AC
  Filled 2019-05-04: qty 2

## 2019-05-04 MED ORDER — SODIUM CHLORIDE 0.9 % IV SOLN: 250.0000 mL | INTRAVENOUS | Status: DC | PRN

## 2019-05-04 MED ORDER — HYDRALAZINE HCL 20 MG/ML IJ SOLN: 10.0000 mg | INTRAMUSCULAR | Status: DC | PRN

## 2019-05-04 MED ORDER — HYDRALAZINE HCL 20 MG/ML IJ SOLN
INTRAMUSCULAR | Status: AC
  Filled 2019-05-04: qty 1

## 2019-05-04 MED ORDER — ACETAMINOPHEN 325 MG PO TABS: 650.0000 mg | ORAL_TABLET | ORAL | Status: DC | PRN

## 2019-05-04 MED ORDER — HYDRALAZINE HCL 20 MG/ML IJ SOLN
INTRAMUSCULAR | Status: DC | PRN
  Administered 2019-05-04 (×2): 10 mg via INTRAVENOUS

## 2019-05-04 MED ORDER — ONDANSETRON HCL 4 MG/2ML IJ SOLN: 4.0000 mg | Freq: Four times a day (QID) | INTRAMUSCULAR | Status: DC | PRN

## 2019-05-04 MED ORDER — LIDOCAINE HCL (PF) 1 % IJ SOLN
INTRAMUSCULAR | Status: DC | PRN
  Administered 2019-05-04: 3 mL
  Administered 2019-05-04: 4 mL

## 2019-05-04 MED ORDER — HEPARIN (PORCINE) IN NACL 1000-0.9 UT/500ML-% IV SOLN
INTRAVENOUS | Status: AC
  Filled 2019-05-04: qty 1000

## 2019-05-04 MED ORDER — FENTANYL CITRATE (PF) 100 MCG/2ML IJ SOLN
INTRAMUSCULAR | Status: DC | PRN
  Administered 2019-05-04: 25 ug via INTRAVENOUS

## 2019-05-04 MED ORDER — FENTANYL CITRATE (PF) 100 MCG/2ML IJ SOLN
INTRAMUSCULAR | Status: AC
  Filled 2019-05-04: qty 2

## 2019-05-04 MED ORDER — VERAPAMIL HCL 2.5 MG/ML IV SOLN
INTRAVENOUS | Status: DC | PRN
  Administered 2019-05-04: 10 mL via INTRA_ARTERIAL

## 2019-05-04 SURGICAL SUPPLY — 12 items
CATH 5FR JL3.5 JR4 ANG PIG MP (CATHETERS) ×2 IMPLANT
CATH BALLN WEDGE 5F 110CM (CATHETERS) ×2 IMPLANT
CATH INFINITI 5FR JR4 125CM (CATHETERS) ×2 IMPLANT
DEVICE RAD COMP TR BAND LRG (VASCULAR PRODUCTS) ×2 IMPLANT
GLIDESHEATH SLEND SS 6F .021 (SHEATH) ×2 IMPLANT
GUIDEWIRE .025 260CM (WIRE) ×2 IMPLANT
GUIDEWIRE INQWIRE 1.5J.035X260 (WIRE) ×2 IMPLANT
INQWIRE 1.5J .035X260CM (WIRE) ×4
PACK CARDIAC CATHETERIZATION (CUSTOM PROCEDURE TRAY) ×2 IMPLANT
SHEATH GLIDE SLENDER 4/5FR (SHEATH) ×2 IMPLANT
SYR MEDRAD MARK 7 150ML (SYRINGE) ×2 IMPLANT
TRANSDUCER W/STOPCOCK (MISCELLANEOUS) ×2 IMPLANT

## 2019-05-04 NOTE — Interval H&P Note (Signed)
History and Physical Interval Note:  05/04/2019 8:01 AM  Erica [name]  has presented today for surgery, with the diagnosis of heart failure.  The various methods of treatment have been discussed with the patient and family. After consideration of risks, benefits and other options for treatment, the patient has consented to  Procedure(s): RIGHT/LEFT HEART CATH AND CORONARY ANGIOGRAPHY (N/A) and possible coronary angioplasty as a surgical intervention.  The patient's history has been reviewed, patient examined, no change in status, stable for surgery.  I have reviewed the patient's chart and labs.  Questions were answered to the patient's satisfaction.     Marton Malizia

## 2019-05-04 NOTE — Discharge Instructions (Signed)
Radial Site Care  This sheet gives you information about how to care for yourself after your procedure. Your health care provider may also give you more specific instructions. If you have problems or questions, contact your health care provider. What can I expect after the procedure? After the procedure, it is common to have:  Bruising and tenderness at the catheter insertion area. Follow these instructions at home: Medicines  Take over-the-counter and prescription medicines only as told by your health care provider. Insertion site care  Follow instructions from your health care provider about how to take care of your insertion site. Make sure you: ? Wash your hands with soap and water before you change your bandage (dressing). If soap and water are not available, use hand sanitizer. ? Change your dressing as told by your health care provider. ? Leave stitches (sutures), skin glue, or adhesive strips in place. These skin closures may need to stay in place for 2 weeks or longer. If adhesive strip edges start to loosen and curl up, you may trim the loose edges. Do not remove adhesive strips completely unless your health care provider tells you to do that.  Check your insertion site every day for signs of infection. Check for: ? Redness, swelling, or pain. ? Fluid or blood. ? Pus or a bad smell. ? Warmth.  Do not take baths, swim, or use a hot tub until your health care provider approves.  You may shower 24-48 hours after the procedure, or as directed by your health care provider. ? Remove the dressing and gently wash the site with plain soap and water. ? Pat the area dry with a clean towel. ? Do not rub the site. That could cause bleeding.  Do not apply powder or lotion to the site. Activity   For 24 hours after the procedure, or as directed by your health care provider: ? Do not flex or bend the affected arm. ? Do not push or pull heavy objects with the affected arm. ? Do not  drive yourself home from the hospital or clinic. You may drive 24 hours after the procedure unless your health care provider tells you not to. ? Do not operate machinery or power tools.  Do not lift anything that is heavier than 10 lb (4.5 kg), or the limit that you are told, until your health care provider says that it is safe.  Ask your health care provider when it is okay to: ? Return to work or school. ? Resume usual physical activities or sports. ? Resume sexual activity. General instructions  If the catheter site starts to bleed, raise your arm and put firm pressure on the site. If the bleeding does not stop, get help right away. This is a medical emergency.  If you went home on the same day as your procedure, a responsible adult should be with you for the first 24 hours after you arrive home.  Keep all follow-up visits as told by your health care provider. This is important. Contact a health care provider if:  You have a fever.  You have redness, swelling, or yellow drainage around your insertion site. Get help right away if:  You have unusual pain at the radial site.  The catheter insertion area swells very fast.  The insertion area is bleeding, and the bleeding does not stop when you hold steady pressure on the area.  Your arm or hand becomes pale, cool, tingly, or numb. These symptoms may represent a serious problem   that is an emergency. Do not wait to see if the symptoms will go away. Get medical help right away. Call your local emergency services (911 in the [place] Do not drive yourself to the hospital. Summary  After the procedure, it is common to have bruising and tenderness at the site.  Follow instructions from your health care provider about how to take care of your radial site wound. Check the wound every day for signs of infection.  Do not lift anything that is heavier than 10 lb (4.5 kg), or the limit that you are told, until your health care provider says  that it is safe. This information is not intended to replace advice given to you by your health care provider. Make sure you discuss any questions you have with your health care provider. Document Revised: 04/23/2017 Document Reviewed: 04/23/2017 Elsevier Patient Education  2020 [employer]  

## 2019-05-06 ENCOUNTER — Ambulatory Visit (INDEPENDENT_AMBULATORY_CARE_PROVIDER_SITE_OTHER): Payer: Medicare Other | Admitting: Cardiovascular Disease

## 2019-05-06 ENCOUNTER — Other Ambulatory Visit: Payer: Self-pay | Admitting: Internal Medicine

## 2019-05-06 ENCOUNTER — Encounter: Payer: Self-pay | Admitting: Cardiovascular Disease

## 2019-05-06 ENCOUNTER — Telehealth: Payer: Self-pay | Admitting: *Deleted

## 2019-05-06 ENCOUNTER — Other Ambulatory Visit: Payer: Self-pay

## 2019-05-06 VITALS — BP 207/98 | HR 95 | Temp 97.7°F | Ht 65.0 in | Wt 208.0 lb

## 2019-05-06 DIAGNOSIS — E785 Hyperlipidemia, unspecified: Secondary | ICD-10-CM

## 2019-05-06 DIAGNOSIS — E669 Obesity, unspecified: Secondary | ICD-10-CM | POA: Diagnosis not present

## 2019-05-06 DIAGNOSIS — G4733 Obstructive sleep apnea (adult) (pediatric): Secondary | ICD-10-CM | POA: Diagnosis not present

## 2019-05-06 DIAGNOSIS — G8929 Other chronic pain: Secondary | ICD-10-CM

## 2019-05-06 DIAGNOSIS — I110 Hypertensive heart disease with heart failure: Secondary | ICD-10-CM | POA: Diagnosis not present

## 2019-05-06 DIAGNOSIS — I5032 Chronic diastolic (congestive) heart failure: Secondary | ICD-10-CM

## 2019-05-06 DIAGNOSIS — I5042 Chronic combined systolic (congestive) and diastolic (congestive) heart failure: Secondary | ICD-10-CM

## 2019-05-06 DIAGNOSIS — I2510 Atherosclerotic heart disease of native coronary artery without angina pectoris: Secondary | ICD-10-CM

## 2019-05-06 DIAGNOSIS — R079 Chest pain, unspecified: Secondary | ICD-10-CM

## 2019-05-06 MED ORDER — DOXAZOSIN MESYLATE 4 MG PO TABS: 4.0000 mg | ORAL_TABLET | Freq: Every day | ORAL | 1 refills | Status: DC

## 2019-05-06 MED ORDER — ROSUVASTATIN CALCIUM 40 MG PO TABS: 40.0000 mg | ORAL_TABLET | Freq: Every day | ORAL | 2 refills | Status: DC

## 2019-05-06 MED ORDER — CLOPIDOGREL BISULFATE 75 MG PO TABS: 75.0000 mg | ORAL_TABLET | Freq: Every day | ORAL | 3 refills | Status: DC

## 2019-05-06 NOTE — Telephone Encounter (Signed)
-----   Message from [name], MD sent at 05/05/2019  9:44 AM EST ----- Hi Tiffany,  This is a complicated patient that I mentioned to you before, she is a h/o pseudotumor cerebri, s/p shunt placement, resistant HTN, Long history of difficult to control hypertension blood pressure - No stenosis on renal artery ultrasound, normal renal parenchyma 03/2019, kidney cyst -She is currently on Spironolactone 100 mg daily, clonidine 0.3 mg daily, Cardizem 420 mg daily, hydralazine 100 mg 3 times a day and valsartan 320 mg daily, Nebivolol 40 mg po daily. -She now has a new LV dysfunction most probably secondary to poorly controlled blood pressure. -renin/aldosterone ratio is pending - L+R cath yesterday showed mild to moderate CAD and mildly elevated LVEDP.  Thank you for accepting her! She is young and motivated, I think she would really benefit from your program, she has poor insight into diet, etc. I will follow your note, I am just really puzzled what am I missing here.  Genelle Gather, Please start her on ASA 81 mg po daily and increase her rosuvastatin to 40 mg po daily. Pleae repeat her lipids, LFTs in 4 weeks, if still elevated, we will refer to the lipid clinic.  Thank you, K ----- Message ----- From: Jolaine Artist, MD Sent: 05/04/2019   4:49 PM EST To: [name], MD

## 2019-05-06 NOTE — Telephone Encounter (Signed)
Refill was sent in electronically by MW.  Called [facility] to cancel this prescription. Nothing further was needed.

## 2019-05-06 NOTE — Telephone Encounter (Signed)
Needs ov before any further refills

## 2019-05-06 NOTE — Progress Notes (Signed)
Hypertension Clinic Initial Assessment:    Date:  05/07/2019   ID:  Erica [name], DOB [birth year], MRN [id number]  PCP:  Erica Lima, MD  Cardiologist:  Erica Dawley, MD  Nephrologist:  Referring MD: Erica Lima, MD   CC: Hypertension  History of Present Illness:    Erica [name] is a 44 y.o. female with a hx of resistant hypertension, chronic diastolic heart failure, breast cancer s/p chemo and XRT, morbid obesity and OSA intolerant of CPAP here to establish care in the hypertension clinic. She reports that her BP has been elevated since she was treated for breast cancer in 2010.  Since then her blood pressure has never been very well have been controlled.  She has been on multiple antihypertensives and her blood pressure remains in the [id number] to [id number] systolic.  She struggles with exertional dyspnea that makes it hard for her to walk even short distances.  Echo 03/2018 revealed LVEF 45 to 50% with grade 2 diastolic dysfunction.  There was mild MR.  PASP was 33.  [name] has a history of recurrent syncope.  She had an episode that occurred while wearing an ambulatory monitor and the episode was felt to be due to narcolepsy.  She has sleep apnea but is not tolerant of her CPAP machine.  She is currently in the midst of being worked up for BiPAP.  She brings pictures of her pulse oximeter when lying down and her oxygen saturations have been in the 80s.  She reports orthopnea but lately she has not had much lower extremity edema.  She uses her albuterol inhaler but it doesn't help.  She underwent left and right heart catheterization on 05/04/2019.  She was found to have 60% distal left circumflex and 80% distal LAD disease.  Given her aspirin allergy Dr. Meda [name] recommended that she start clopidogrel.  Right atrial pressure was 10, and PA pressure was 26/10.  PCWP 18.  LVEDP was 23 mmHg.  She cooks at home and eats a healthy diet.  She tries to limit sodium intake she reports taking all her  medications as prescribed, rarely misses doses, and knows the names and times for each medication.  However, on calling her pharmacy ([facility]), clonidine was last filled for 90 days 12/2018, hydralazine was filled 04/2019, nebivolol was never filled, spironolactone was never filled, and both torsemide and valsartan were last filled 09/2018 for 90 days.  She reports that her pulmonologist supplies her with samples of nebivolol and that the pharmacy records are incorrect regarding the filling of her other medications.  She was started on rosuvastatin in 2019 and her lipids have remained unchanged, if not worse.  AM Medications:  9:30 acetazolamide bystolic hydralazine Spironolactone Torsemide ditlazem  Afternoon Meds: 12:30/1 hydralazine acetazolamide  Evening Meds:7:30 hydralzaine   Cantril's Ladder: Current well-being: 2 Well-being in 5 years: 6 Financial well-being: 1   Past Medical History:  Diagnosis Date  . Allergy   . Anxiety   . Arthritis   . Asthma   . Blood transfusion without reported diagnosis   . Breast cancer ([place]) 2010   a. locally advanced left breast carcinoma diagnosed in 2010 and treated with neoadjuvant chemotherapy with docetaxel and cyclophosphamide as well as paclitaxel. This was followed by radiation therapy which was completed in 2011.  Marland Kitchen CHF (congestive heart failure) ([place])   . Chronic kidney disease   . Dyspnea on exertion    a. 01/2013 Lexi MV: EF 54%,  no ischemia/infarct;  b. 05/2013 Echo: EF 60-65%, no rwma, Gr 2 DD;  b.   . Fatty liver   . Gastroparesis   . GERD (gastroesophageal reflux disease)   . Hypertension   . Impaired glucose tolerance 04/13/2011  . Lymphadenitis, chronic    restricted LEFT extremity  . 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 OSA (obstructive sleep apnea) 05/04/2014   AHI 31/hr now on CPAP at 12cm H2O  . Oxygen deficiency    Uses 02 in CPAP  . Personal history of chemotherapy 2010  . Personal history of  radiation therapy 2010  . Sleep apnea     Past Surgical History:  Procedure Laterality Date  . BRAIN SURGERY     Shunt  . BREAST LUMPECTOMY Left 09/2008  . CARPAL TUNNEL RELEASE Left 2011  . LYMPH NODE DISSECTION Left 2010   breast; 2 wks after breast lumpectomy  . RIGHT/LEFT HEART CATH AND CORONARY ANGIOGRAPHY N/A 05/04/2019   Procedure: RIGHT/LEFT HEART CATH AND CORONARY ANGIOGRAPHY;  Surgeon: Erica Artist, MD;  Location: [facility];  Service: Cardiovascular;  Laterality: N/A;  Renal injection preformed   . TENDON RELEASE Right 2012   "hand"  . TUBAL LIGATION  05/11/2011   Procedure: POST PARTUM TUBAL LIGATION;  Surgeon: Erica Reeve, MD;  Location: [facility];  Service: Gynecology;  Laterality: Bilateral;  Induced for HTN    Current Medications: No outpatient medications have been marked as taking for the 05/06/19 encounter (Office Visit) with Erica Latch, MD.     Allergies:   Aspirin, Doxycycline, Kiwi extract, Metoprolol, Penicillins, Strawberry extract, and Sulfamethoxazole-trimethoprim   Social History   Socioeconomic History  . Marital status: Single    Spouse name: Not on file  . Number of children: 3  . Years of education: Not on file  . Highest education level: Not on file  Occupational History    Employer: UNEMPLOYED  Tobacco Use  . Smoking status: Never Smoker  . Smokeless tobacco: Never Used  Substance and Sexual Activity  . Alcohol use: Yes    Alcohol/week: 0.0 standard drinks    Comment: occasionally/socially  . Drug use: No  . Sexual activity: Yes    Birth control/protection: Surgical  Other Topics Concern  . Not on file  Social History Narrative   She lives with four children.   She is currently not working (disbaility pending).      Social Determinants of Health   Financial Resource Strain:   . Difficulty of Paying Living Expenses: Not on file  Food Insecurity: Food Insecurity Present  . Worried About Charity fundraiser in the  Last Year: Often true  . Ran Out of Food in the Last Year: Often true  Transportation Needs:   . Lack of Transportation (Medical): Not on file  . Lack of Transportation (Non-Medical): Not on file  Physical Activity:   . Days of Exercise per Week: Not on file  . Minutes of Exercise per Session: Not on file  Stress:   . Feeling of Stress : Not on file  Social Connections:   . Frequency of Communication with Friends and Family: Not on file  . Frequency of Social Gatherings with Friends and Family: Not on file  . Attends Religious Services: Not on file  . Active Member of Clubs or Organizations: Not on file  . Attends Archivist Meetings: Not on file  . Marital Status: Not on file     Family History: The  patient's family history includes Breast cancer in some other family members; CAD in her maternal grandmother; Diabetes in her maternal grandmother; Fibroids (age of onset: 34) in her sister; Heart disease in her maternal grandmother; Hypertension in her maternal grandmother; Other (age of onset: 69) in her mother. There is no history of Cancer, Alcohol abuse, Early death, Hyperlipidemia, Kidney disease, Stroke, Colon cancer, Esophageal cancer, Stomach cancer, or Rectal cancer.  ROS:   Please see the history of present illness.     All other systems reviewed and are negative.  EKGs/Labs/Other Studies Reviewed:    EKG:  EKG is not ordered today.  The ekg ordered 04/23/19 demonstrates sinus tachycardia.  Rate 124 bpm.  LVH  Recent Labs: 12/09/2018: ALT 27 04/30/2019: BUN 9; Creatinine, Ser 1.26; NT-Pro BNP 346; Platelets 216; TSH 1.470 05/04/2019: Hemoglobin 12.6; Hemoglobin 12.9; Potassium 3.1; Potassium 3.3; Sodium 144; Sodium 143   Recent Lipid Panel    Component Value Date/Time   CHOL 198 10/01/2018 1115   TRIG 113.0 10/01/2018 1115   HDL 45.50 10/01/2018 1115   CHOLHDL 4 10/01/2018 1115   VLDL 22.6 10/01/2018 1115   LDLCALC 130 (H) 10/01/2018 1115   LDLDIRECT 152.0  03/30/2018 1604    Echo 03/12/18: Study Conclusions   - Left ventricle: The cavity size was normal. Wall thickness was  increased in a pattern of moderate LVH. Systolic function was  mildly reduced. The estimated ejection fraction was in the range  of 45% to 50%. Wall motion was normal; there were no regional  wall motion abnormalities. Features are consistent with a  pseudonormal left ventricular filling pattern, with concomitant  abnormal relaxation and increased filling pressure (grade 2  diastolic dysfunction).  - Mitral valve: There was mild regurgitation.  - Pulmonary arteries: Systolic pressure was mildly increased. PA  peak pressure: 33 mm Hg (S).   Lexiscan Myoview 04/20/18:   The left ventricular ejection fraction is moderately decreased (30-44%).  Nuclear stress EF: 41%.  There was no ST segment deviation noted during stress.  This is an intermediate risk study.   Abnormal, intermediate risk stress nuclear study with no ischemia or infarction; EF 41 with global hypokinesis and mild LVE; study intermediate risk due to reduced LV function.  Renal artery Doppler 10/29/18: Right: 1-59% stenosis of the right renal artery. Cyst(s) noted.  Left: 1-59% stenosis of the left renal artery.    LHC/RHC 05/04/19:  Dist LAD-1 lesion is 30% stenosed.  Dist LAD-2 lesion is 80% stenosed.  Dist Cx lesion is 60% stenosed.   Findings:  Ao = 173/104 (134) LV =  181/25 RA =  10 RV =  41/13 PA =  42/10 (26) PCW = 18 Fick cardiac output/index = 5.8/2.9 PVR = 1.4 wu Ao sat = 98% PA sat = 73%, 73%  Assessment: 1. 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 LVEF 55-60% 3. Mildly elevated filling pressures with mild pulmonary venous HTN 4. Widely patent bilateral renal arteries. No evidence of AAA 5. Severe systemic HTN    Physical Exam:    VS:  BP (!) 207/98   Pulse 95   Temp 97.7 F (36.5 C)   Ht 5\' 5"  (1.651 m)   Wt 208 lb (94.3 kg)   SpO2 99%    BMI 34.61 kg/m     Wt Readings from Last 3 Encounters:  05/06/19 208 lb (94.3 kg)  05/04/19 205 lb (93 kg)  04/26/19 205 lb (93 kg)    VS:  BP (!) 207/98  Pulse 95   Temp 97.7 F (36.5 C)   Ht 5\' 5"  (1.651 m)   Wt 208 lb (94.3 kg)   SpO2 99%   BMI 34.61 kg/m  , BMI Body mass index is 34.61 kg/m. GENERAL:  Well appearing.  Tearful at times HEENT: Pupils equal round and reactive, fundi not visualized, oral mucosa unremarkable NECK:  No jugular venous distention, waveform within normal limits, carotid upstroke brisk and symmetric, no bruits, no thyromegaly LYMPHATICS:  No cervical adenopathy LUNGS:  Clear to auscultation bilaterally HEART:  RRR.  PMI not displaced or sustained,S1 and S2 within normal limits, no S3, no S4, no clicks, no rubs, no murmurs ABD:  Flat, positive bowel sounds normal in frequency in pitch, no bruits, no rebound, no guarding, no midline pulsatile mass, no hepatomegaly, no splenomegaly EXT:  2 plus pulses throughout, no edema, no cyanosis no clubbing SKIN:  No rashes no nodules NEURO:  Cranial nerves II through XII grossly intact, motor grossly intact throughout PSYCH:  Cognitively intact, oriented to person place and time   ASSESSMENT:    1. Chronic diastolic CHF (congestive heart failure) ([place])   2. Hypertensive heart disease with chronic combined systolic and diastolic congestive heart failure ([place])   3. Mild obesity   4. OSA (obstructive sleep apnea)     PLAN:    # Resistant hypertension:  Ms. Spedale BP remains quite elevated despite being on multiple medications.  Her medication compliance is somewhat unclear.  She certainly has a clear understanding of her medication list and seems quite motivated.  However upon calling her pharmacy, clonidine was last filled 12/2018, nebivolol and spironolactone were never filled, and both torsemide and valsartan were last filled 09/2018.  She is adamant that she takes all her medications daily as prescribed.   However she states [facility]  Therefore is unclear how this type of an error could occur.  Furthermore, she was started on rosuvastatin over a year ago and her lipids have not improved.  If anything they have been worse.  Therefore, I am suspicious that compliance is a significant issue.  It is also interesting that despite being on both spironolactone and losartan her potassium was 3.0.  Agree with renin/aldo work up for hyperaldosteronism.  Dr. Haroldine Laws performed angiography of her renal arteries and there was no significant renal artery stenosis.  Given that she reports taking her medicines as prescribed we will add doxazosin 4 mg nightly.  Most of her other once daily medicine she takes in the mornings, so it may be helpful to break them up.  She would be a good candidate for the prep program but is currently too dyspneic to participate.  We will focus on getting her blood pressure down and then refer her.  She was given a hypertension booklet to track her blood pressures twice daily.  She will follow up with our pharmacist in 1 month.  If her blood pressure continues to be poorly controlled despite actually taking her medications we may need to consider a referral for consideration of renal denervation.  # OSA:  Patient encouraged to continue the evaluation for BiPAP.  She may benefit from nocturnal oxygen in the meantime.  Her oxygen saturation was 97 % when walking in the office.   Disposition:    F/u with PharmD in 1 month x3 months.  Then f/u with me in 4 months.   Time spent: 30 minutes-Greater than 50% of this time was spent  in counseling, explanation of diagnosis, planning of further management, and coordination of care.    Medication Adjustments/Labs and Tests Ordered: Current medicines are reviewed at length with the patient today.  Concerns regarding medicines are outlined above.  No orders of the defined types were placed in this encounter.  Meds ordered  this encounter  Medications  . doxazosin (CARDURA) 4 MG tablet    Sig: Take 1 tablet (4 mg total) by mouth daily.    Dispense:  90 tablet    Refill:  1     Signed, Erica Latch, MD  05/07/2019 11:32 AM    [place]

## 2019-05-06 NOTE — Patient Instructions (Addendum)
Medication Instructions:  START DOXAZOSIN 4 MG DAILY    Labwork: NONE   Testing/Procedures: NONE   Follow-Up: 06/03/2019 AT 10:00 AM IN THE OFFICE WITH PHARM D    Special Instructions:   MONITOR YOUR BLOOD PRESSURE TWICE A DAY, LOG IN THE BOOK PROVIDED. BRING BOOK AND YOUR MEDICATIONS TO YOUR FOLLOW UP VISIT   If you have questions regarding your blood pressure or any medication changes from Monday through Friday, 8 AM through 5 PM please call Vanita Ingles, RN [phone]) or Winifred Olive, RN [phone]).  If you are in need of emergency care after hours, please call 911 or be seen in the emergency room.    DASH Eating Plan DASH stands for "Dietary Approaches to Stop Hypertension." The DASH eating plan is a healthy eating plan that has been shown to reduce high blood pressure (hypertension). It may also reduce your risk for type 2 diabetes, heart disease, and stroke. The DASH eating plan may also help with weight loss. What are tips for following this plan?  General guidelines  Avoid eating more than 2,300 mg (milligrams) of salt (sodium) a day. If you have hypertension, you may need to reduce your sodium intake to 1,500 mg a day.  Limit alcohol intake to no more than 1 drink a day for nonpregnant women and 2 drinks a day for men. One drink equals 12 oz of beer, 5 oz of wine, or 1 oz of hard liquor.  Work with your health care provider to maintain a healthy body weight or to lose weight. Ask what an ideal weight is for you.  Get at least 30 minutes of exercise that causes your heart to beat faster (aerobic exercise) most days of the week. Activities may include walking, swimming, or biking.  Work with your health care provider or diet and nutrition specialist (dietitian) to adjust your eating plan to your individual calorie needs. Reading food labels   Check food labels for the amount of sodium per serving. Choose foods with less than 5 percent of the Daily Value of sodium.  Generally, foods with less than 300 mg of sodium per serving fit into this eating plan.  To find whole grains, look for the word "whole" as the first word in the ingredient list. Shopping  Buy products labeled as "low-sodium" or "no salt added."  Buy fresh foods. Avoid canned foods and premade or frozen meals. Cooking  Avoid adding salt when cooking. Use salt-free seasonings or herbs instead of table salt or sea salt. Check with your health care provider or pharmacist before using salt substitutes.  Do not fry foods. Cook foods using healthy methods such as baking, boiling, grilling, and broiling instead.  Cook with heart-healthy oils, such as olive, canola, soybean, or sunflower oil. Meal planning  Eat a balanced diet that includes: ? 5 or more servings of fruits and vegetables each day. At each meal, try to fill half of your plate with fruits and vegetables. ? Up to 6-8 servings of whole grains each day. ? Less than 6 oz of lean meat, poultry, or fish each day. A 3-oz serving of meat is about the same size as a deck of cards. One egg equals 1 oz. ? 2 servings of low-fat dairy each day. ? A serving of nuts, seeds, or beans 5 times each week. ? Heart-healthy fats. Healthy fats called Omega-3 fatty acids are found in foods such as flaxseeds and coldwater fish, like sardines, salmon, and mackerel.  Limit how much  you eat of the following: ? Canned or prepackaged foods. ? Food that is high in trans fat, such as fried foods. ? Food that is high in saturated fat, such as fatty meat. ? Sweets, desserts, sugary drinks, and other foods with added sugar. ? Full-fat dairy products.  Do not salt foods before eating.  Try to eat at least 2 vegetarian meals each week.  Eat more home-cooked food and less restaurant, buffet, and fast food.  When eating at a restaurant, ask that your food be prepared with less salt or no salt, if possible. What foods are recommended? The items listed may not  be a complete list. Talk with your dietitian about what dietary choices are best for you. Grains Whole-grain or whole-wheat bread. Whole-grain or whole-wheat pasta. Brown rice. [place]row. Bulgur. Whole-grain and low-sodium cereals. Pita bread. Low-fat, low-sodium crackers. Whole-wheat flour tortillas. Vegetables Fresh or frozen vegetables (raw, steamed, roasted, or grilled). Low-sodium or reduced-sodium tomato and vegetable juice. Low-sodium or reduced-sodium tomato sauce and tomato paste. Low-sodium or reduced-sodium canned vegetables. Fruits All fresh, dried, or frozen fruit. Canned fruit in natural juice (without added sugar). Meat and other protein foods Skinless chicken or [place]. Ground chicken or [place]. Pork with fat trimmed off. Fish and seafood. Egg whites. Dried beans, peas, or lentils. Unsalted nuts, nut butters, and seeds. Unsalted canned beans. Lean cuts of beef with fat trimmed off. Low-sodium, lean deli meat. Dairy Low-fat (1%) or fat-free (skim) milk. Fat-free, low-fat, or reduced-fat cheeses. Nonfat, low-sodium ricotta or cottage cheese. Low-fat or nonfat yogurt. Low-fat, low-sodium cheese. Fats and oils Soft margarine without trans fats. Vegetable oil. Low-fat, reduced-fat, or light mayonnaise and salad dressings (reduced-sodium). Canola, safflower, olive, soybean, and sunflower oils. Avocado. Seasoning and other foods Herbs. Spices. Seasoning mixes without salt. Unsalted popcorn and pretzels. Fat-free sweets. What foods are not recommended? The items listed may not be a complete list. Talk with your dietitian about what dietary choices are best for you. Grains Baked goods made with fat, such as croissants, muffins, or some breads. Dry pasta or rice meal packs. Vegetables Creamed or fried vegetables. Vegetables in a cheese sauce. Regular canned vegetables (not low-sodium or reduced-sodium). Regular canned tomato sauce and paste (not low-sodium or reduced-sodium). Regular  tomato and vegetable juice (not low-sodium or reduced-sodium). Angie Fava. Olives. Fruits Canned fruit in a light or heavy syrup. Fried fruit. Fruit in cream or butter sauce. Meat and other protein foods Fatty cuts of meat. Ribs. Fried meat. Berniece Salines. Sausage. Bologna and other processed lunch meats. Salami. Fatback. Hotdogs. Bratwurst. Salted nuts and seeds. Canned beans with added salt. Canned or smoked fish. Whole eggs or egg yolks. Chicken or [place] with skin. Dairy Whole or 2% milk, cream, and half-and-half. Whole or full-fat cream cheese. Whole-fat or sweetened yogurt. Full-fat cheese. Nondairy creamers. Whipped toppings. Processed cheese and cheese spreads. Fats and oils Butter. Stick margarine. Lard. Shortening. Ghee. Bacon fat. Tropical oils, such as coconut, palm kernel, or palm oil. Seasoning and other foods Salted popcorn and pretzels. Onion salt, garlic salt, seasoned salt, table salt, and sea salt. Worcestershire sauce. Tartar sauce. Barbecue sauce. Teriyaki sauce. Soy sauce, including reduced-sodium. Steak sauce. Canned and packaged gravies. Fish sauce. Oyster sauce. Cocktail sauce. Horseradish that you find on the shelf. Ketchup. Mustard. Meat flavorings and tenderizers. Bouillon cubes. Hot sauce and Tabasco sauce. Premade or packaged marinades. Premade or packaged taco seasonings. Relishes. Regular salad dressings. Where to find more information:  National Heart, Lung, and Blood Institute: https://wilson-eaton.com/  American Heart Association: www.heart.org Summary  The DASH eating plan is a healthy eating plan that has been shown to reduce high blood pressure (hypertension). It may also reduce your risk for type 2 diabetes, heart disease, and stroke.  With the DASH eating plan, you should limit salt (sodium) intake to 2,300 mg a day. If you have hypertension, you may need to reduce your sodium intake to 1,500 mg a day.  When on the DASH eating plan, aim to eat more fresh fruits and  vegetables, whole grains, lean proteins, low-fat dairy, and heart-healthy fats.  Work with your health care provider or diet and nutrition specialist (dietitian) to adjust your eating plan to your individual calorie needs. This information is not intended to replace advice given to you by your health care provider. Make sure you discuss any questions you have with your health care provider. Document Released: 03/07/2011 Document Revised: 02/28/2017 Document Reviewed: 03/11/2016 Elsevier Patient Education  2020 [employer].

## 2019-05-06 NOTE — Telephone Encounter (Signed)
Erica Spark, MD  05/05/2019 5:52 PM EST    Lets do plavix 75 mg po daily, thank you

## 2019-05-06 NOTE — Telephone Encounter (Signed)
Spoke with the pt and endorsed to her, her cath report and plan per Dr. Meda Coffee. Informed the pt that I had to get clarification about Dr. Meda Coffee wanting her to start ASA 81 mg po daily, for she is allergic to this med, causing her extreme sob and palpitations.  With that being said, Dr. Meda Coffee now recommends that she start taking plavix 75 mg po daily. So informed the pt that per Dr. Meda Coffee, based on her cath report, she wants her to start taking Plavix 75 mg po daily, increase her rosuvastatin to 40 mg po daily, and have her come in for repeat lipids/LFTs in 4 weeks, and if at that time her lipids are still elevated, then we will refer her to our lipid clinic. Advised the pt to follow-up with Dr. Oval Linsey in BP clinic as planned.  Confirmed the pharmacy of choice with the pt.  Scheduled her to come into the lab in 4 weeks on 06/04/19, to check lipids and LFTs.  Pt is aware to come fasting.  Pt verbalized understanding and agrees with this plan.

## 2019-05-07 ENCOUNTER — Telehealth (HOSPITAL_COMMUNITY): Payer: Self-pay | Admitting: Licensed Clinical Social Worker

## 2019-05-07 ENCOUNTER — Encounter: Payer: Self-pay | Admitting: Cardiovascular Disease

## 2019-05-07 NOTE — Telephone Encounter (Signed)
CSW received consult from [facility] office to reach out to patient regarding housing and food insecurity concerns.  [facility] office has already provided pt with number to [employer] and given a Biomedical scientist.  Pt confirms that she is struggling somewhat with bills/affording food but that she gets help from her parents so they have been able to manage.  Pt recently moved to [place] from [facility] with her 2 minor children.  States that her oldest adult daughter had been living with them until recently but that she moved out so they lost her income as well which has made it harder.  CSW sent patient list of local food pantries and link to [employer] to apply for foodstamps online as patient reports having access to a computer at home.  CSW then discussed housing- pt currently renting an apartment but it is to expensive (over $1,000/month with utilities) so is interested in income based housing options.  CSW sent list of social serve options in her price range and sent her applications to several local apartment sites that provide income based housing.  CSW inquired about patient current medicaid status as it shows pt has [place] Medicaid in our system.  Pt confirmed she has not switched her Medicaid to [place]- CSW explained that she would need to call her  case worker and terminate her [facility] Medicaid then apply through [employer] for [place] Medicaid- pt expressed understanding.  No further concerns expressed at this time- encouraged pt to reach out with any questions  Jorge Ny, [facility] Desk#: [phone] Cell#: [phone]

## 2019-05-10 ENCOUNTER — Other Ambulatory Visit (HOSPITAL_COMMUNITY): Payer: Medicare Other

## 2019-05-10 NOTE — Telephone Encounter (Signed)
Notified the pt and informed her that unfortunately, Dr. Meda Coffee was unable to get her qualified for oxygen, but she did want me to advise the pt that she needs to follow-up with her Pulmonologist, to see if he would be able to get this for the pt.  Dr. Oval Linsey also tried to get the pt qualified for nocturnal oxygen, but unfortunately her O2 sats were 97% when walking in the office.  Pt will be going on the 22nd of this month to be evaluated for switching her CPAP device for BIPAP.   She was supposed to go this week but her allergies were bothering her.  Pt will also see her Pulmonologist Dr. Melvyn Novas on 05/19/19 and she will try to see if he can provide in getting her qualified for nocturnal O2, until she can get her sleep device changed. Pt verbalized understanding and agrees with this plan.

## 2019-05-11 ENCOUNTER — Encounter: Payer: Self-pay | Admitting: Family Medicine

## 2019-05-11 ENCOUNTER — Ambulatory Visit (INDEPENDENT_AMBULATORY_CARE_PROVIDER_SITE_OTHER): Payer: Medicare Other

## 2019-05-11 ENCOUNTER — Other Ambulatory Visit: Payer: Self-pay | Admitting: Internal Medicine

## 2019-05-11 ENCOUNTER — Ambulatory Visit (INDEPENDENT_AMBULATORY_CARE_PROVIDER_SITE_OTHER): Payer: Medicaid - Out of State

## 2019-05-11 ENCOUNTER — Ambulatory Visit (INDEPENDENT_AMBULATORY_CARE_PROVIDER_SITE_OTHER): Payer: Medicare Other | Admitting: Family Medicine

## 2019-05-11 ENCOUNTER — Other Ambulatory Visit: Payer: Self-pay

## 2019-05-11 VITALS — BP 150/90 | HR 195 | Ht 65.0 in | Wt 208.0 lb

## 2019-05-11 DIAGNOSIS — M25571 Pain in right ankle and joints of right foot: Secondary | ICD-10-CM

## 2019-05-11 DIAGNOSIS — G8929 Other chronic pain: Secondary | ICD-10-CM | POA: Diagnosis not present

## 2019-05-11 NOTE — Assessment & Plan Note (Addendum)
On ultrasound appears to have a very small nondisplaced lateral malleolus fracture.  Aircast given, discussed icing regimen and home exercise, discussed which activities to do which wants to avoid.  Patient will continue with range of motion.  Is in orthotics that I think will be beneficial.  Follow-up with me again in 4 weeks  Social determinants of health including financial and transportation constraints and updated comorbidities that are contributing to patient not being able to workout on a regular basis.

## 2019-05-11 NOTE — Progress Notes (Signed)
[facility] [address] [place] Phone: [phone] Subjective:   I Erica [name] am serving as a Education administrator for Dr. Hulan Saas.  This visit occurred during the SARS-CoV-2 public health emergency.  Safety protocols were in place, including screening questions prior to the visit, additional usage of staff PPE, and extensive cleaning of exam room while observing appropriate contact time as indicated for disinfecting solutions.   I'm seeing this patient by the request  of:  Erica Lima, MD  CC: Ankle pain follow-up  [id number]   04/14/2019 Patient has some nonspecific findings of the anterior lateral aspect of the right ankle.  Trace swelling noted of the joint.  Questionable uric acid deposits first possible pseudogout noted.  Seems to be acute on chronic.  We discussed compression, prednisone, proper shoes, home exercises, follow-up again in 4 weeks  Update 05/11/2019 Erica [name] is a 44 y.o. female coming in with complaint of right ankle pain. Patient states the left is doing well. Right is still painful.  States that the swelling is significantly better and wearing the custom bracing has been very helpful.  Patient is making progress but continues to have some difficulty more secondary to her heart she states at the moment.     Past Medical History:  Diagnosis Date  . Allergy   . Anxiety   . Arthritis   . Asthma   . Blood transfusion without reported diagnosis   . Breast cancer ([place]) 2010   a. locally advanced left breast carcinoma diagnosed in 2010 and treated with neoadjuvant chemotherapy with docetaxel and cyclophosphamide as well as paclitaxel. This was followed by radiation therapy which was completed in 2011.  Marland Kitchen CHF (congestive heart failure) ([place])   . Chronic kidney disease   . Dyspnea on exertion    a. 01/2013 Lexi MV: EF 54%, no ischemia/infarct;  b. 05/2013 Echo: EF 60-65%, no rwma, Gr 2 DD;  b.   . Fatty liver   .  Gastroparesis   . GERD (gastroesophageal reflux disease)   . Hypertension   . Impaired glucose tolerance 04/13/2011  . Lymphadenitis, chronic    restricted LEFT extremity  . 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 OSA (obstructive sleep apnea) 05/04/2014   AHI 31/hr now on CPAP at 12cm H2O  . Oxygen deficiency    Uses 02 in CPAP  . Personal history of chemotherapy 2010  . Personal history of radiation therapy 2010  . Sleep apnea    Past Surgical History:  Procedure Laterality Date  . BRAIN SURGERY     [place]  . BREAST LUMPECTOMY Left 09/2008  . CARPAL TUNNEL RELEASE Left 2011  . LYMPH NODE DISSECTION Left 2010   breast; 2 wks after breast lumpectomy  . RIGHT/LEFT HEART CATH AND CORONARY ANGIOGRAPHY N/A 05/04/2019   Procedure: RIGHT/LEFT HEART CATH AND CORONARY ANGIOGRAPHY;  Surgeon: Jolaine Artist, MD;  Location: [facility];  Service: Cardiovascular;  Laterality: N/A;  Renal injection preformed   . TENDON RELEASE Right 2012   "hand"  . TUBAL LIGATION  05/11/2011   Procedure: POST PARTUM TUBAL LIGATION;  Surgeon: Emeterio Reeve, MD;  Location: [facility];  Service: Gynecology;  Laterality: Bilateral;  Induced for HTN   Social History   Socioeconomic History  . Marital status: Single    Spouse name: Not on file  . Number of children: 3  . Years of education: Not on file  . Highest education level:  Not on file  Occupational History    Employer: UNEMPLOYED  Tobacco Use  . Smoking status: Never Smoker  . Smokeless tobacco: Never Used  Substance and Sexual Activity  . Alcohol use: Yes    Alcohol/week: 0.0 standard drinks    Comment: occasionally/socially  . Drug use: No  . Sexual activity: Yes    Birth control/protection: Surgical  Other Topics Concern  . Not on file  Social History Narrative   She lives with four children.   She is currently not working (disbaility pending).      Social Determinants of Health   Financial Resource Strain:   . Difficulty of Paying  Living Expenses: Not on file  Food Insecurity: Food Insecurity Present  . Worried About Charity fundraiser in the Last Year: Often true  . Ran Out of Food in the Last Year: Often true  Transportation Needs:   . Lack of Transportation (Medical): Not on file  . Lack of Transportation (Non-Medical): Not on file  Physical Activity:   . Days of Exercise per Week: Not on file  . Minutes of Exercise per Session: Not on file  Stress:   . Feeling of Stress : Not on file  Social Connections:   . Frequency of Communication with Friends and Family: Not on file  . Frequency of Social Gatherings with Friends and Family: Not on file  . Attends Religious Services: Not on file  . Active Member of Clubs or Organizations: Not on file  . Attends Archivist Meetings: Not on file  . Marital Status: Not on file   Allergies  Allergen Reactions  . Aspirin Shortness Of Breath and Palpitations    Pt can take ibuprofen without reaction  . Doxycycline Anaphylaxis  . Kiwi Extract Itching and Swelling    Lip swelling  . Metoprolol Hives and Itching  . Penicillins Shortness Of Breath and Palpitations    Has patient had a PCN reaction causing immediate rash, facial/tongue/throat swelling, SOB or lightheadedness with hypotension: Yes Has patient had a PCN reaction causing severe rash involving mucus membranes or skin necrosis: Yes Has patient had a PCN reaction that required hospitalization Yes Has patient had a PCN reaction occurring within the last 10 years: No If all of the above answers are "NO", then may proceed with Cephalosporin use.  . Strawberry Extract Hives  . Sulfamethoxazole-Trimethoprim Anaphylaxis    Facial, throat and tongue swelling with difficulty swallowing.   Family History  Problem Relation Age of Onset  . Other Mother 61       breast calcifications treated with surgery, breast cancer pill, and radiation  . Fibroids Sister 19       s/p TAH-BSO  . Diabetes Maternal  Grandmother   . Heart disease Maternal Grandmother   . Hypertension Maternal Grandmother   . CAD Maternal Grandmother   . Breast cancer Other        maternal great aunt (MGM's sister)  . Breast cancer Other        paternal great aunt dx middle ages; s/p mastectomy  . Cancer Neg Hx   . Alcohol abuse Neg Hx   . Early death Neg Hx   . Hyperlipidemia Neg Hx   . Kidney disease Neg Hx   . Stroke Neg Hx   . Colon cancer Neg Hx   . Esophageal cancer Neg Hx   . Stomach cancer Neg Hx   . Rectal cancer Neg Hx      Current Outpatient  Medications (Cardiovascular):  .  acetaZOLAMIDE (DIAMOX) 250 MG tablet, TAKE 4 TABLETS BY MOUTH 3 TIMES DAILY. (Patient taking differently: Take 2,000 mg by mouth 3 (three) times daily. ) .  cloNIDine (CATAPRES - DOSED IN MG/24 HR) 0.3 mg/24hr patch, PLACE 1 PATCH ONTO SKIN ONCE WEEKLY (Patient taking differently: Place 0.3 mg onto the skin once a week. Sunday) .  diltiazem (CARDIZEM LA) 420 MG 24 hr tablet, Take 1 tablet (420 mg total) by mouth daily. Marland Kitchen  doxazosin (CARDURA) 4 MG tablet, Take 1 tablet (4 mg total) by mouth daily. Marland Kitchen  EPINEPHRINE 0.3 mg/0.3 mL IJ SOAJ injection, INJECT 0.3 MLS INTO THE MUSCLE ONCE AS NEEDED FOR ANAPHYLAXIS (Patient taking differently: Inject 0.3 mg into the muscle as needed for anaphylaxis. ) .  hydrALAZINE (APRESOLINE) 100 MG tablet, TAKE 1 TABLET (100 MG TOTAL) BY MOUTH 3 (THREE) TIMES DAILY. .  Nebivolol HCl 20 MG TABS, Take 2 tablets (40 mg total) by mouth daily. Marland Kitchen  NITROSTAT 0.4 MG SL tablet, PLACE 1 TABLET UNDER TONGUE EVERY 5 MINUTES AS NEEDED FOR CHEST PAIN, MAX OF 3 TABLETS THEN CALL 911 IF PAIN PERSISTS (Patient taking differently: Place 0.4 mg under the tongue every 5 (five) minutes as needed for chest pain. ) .  ranolazine (RANEXA) 500 MG 12 hr tablet, TAKE 1 TABLET BY MOUTH TWICE A DAY (Patient taking differently: Take 500 mg by mouth 2 (two) times daily. ) .  rosuvastatin (CRESTOR) 40 MG tablet, Take 1 tablet (40 mg total)  by mouth daily. Marland Kitchen  spironolactone (ALDACTONE) 100 MG tablet, TAKE 1 TABLET BY MOUTH EVERY DAY (Patient taking differently: Take 100 mg by mouth daily. ) .  torsemide (DEMADEX) 20 MG tablet, Take 1 tablet (20 mg total) by mouth daily. .  valsartan (DIOVAN) 320 MG tablet, TAKE 1 TABLET BY MOUTH EVERY DAY (Patient taking differently: Take 320 mg by mouth daily. )  Current Outpatient Medications (Respiratory):  .  albuterol (PROVENTIL) (2.5 MG/3ML) 0.083% nebulizer solution, USE 1 VIAL VIA NEBULIZER EVERY 4 HOURS AND AS NEEDED FOR WHEEZING OR SHORTNESS OF BREATH (Patient taking differently: Take 2.5 mg by nebulization every 4 (four) hours as needed for wheezing. ) .  albuterol (VENTOLIN HFA) 108 (90 Base) MCG/ACT inhaler, INHALE 2 PUFFS BY MOUTH EVERY 4 HOURS AS NEEDED FOR WHEEZE OR FOR SHORTNESS OF BREATH (Patient taking differently: Inhale 1-2 puffs into the lungs every 4 (four) hours as needed for wheezing or shortness of breath. ) .  budesonide-formoterol (SYMBICORT) 160-4.5 MCG/ACT inhaler, INHALE 2 PUFFS INTO LUNGS 2 TIMES DAILY AS DIRECTED .  dextromethorphan-guaiFENesin (MUCINEX DM) 30-600 MG 12hr tablet, Take 2 tablets by mouth 2 (two) times daily.  .  montelukast (SINGULAIR) 10 MG tablet, Take 1 tablet (10 mg total) by mouth at bedtime. Marland Kitchen  oxymetazoline (MUCINEX SINUS-MAX FULL FORCE) 0.05 % nasal spray, Place 1 spray into both nostrils 2 (two) times daily.  .  promethazine (PHENERGAN) 25 MG suppository, Place 25 mg rectally every 6 (six) hours as needed for nausea or vomiting. .  promethazine (PHENERGAN) 25 MG tablet, Take 1 tablet (25 mg total) by mouth 3 (three) times daily as needed for nausea or vomiting.  Current Outpatient Medications (Analgesics):  .  acetaminophen-codeine (TYLENOL #4) 300-60 MG tablet, Take 1 tablet by mouth every 4 (four) hours as needed for pain. AS NEEDED for cough  Current Outpatient Medications (Hematological):  .  clopidogrel (PLAVIX) 75 MG tablet, Take 1  tablet (75 mg total) by mouth daily.  Current Outpatient Medications (Other):  .  amitriptyline (ELAVIL) 100 MG tablet, TAKE 1 TABLET BY MOUTH EVERYDAY AT BEDTIME (Patient taking differently: Take 100 mg by mouth at bedtime. ) .  Biotin 5000 MCG TABS, Take 5,000 mg by mouth 2 (two) times daily.  .  Cholecalciferol 1.25 MG (50000 UT) capsule, Take 1 capsule (50,000 Units total) by mouth once a week. .  clotrimazole (MYCELEX) 10 MG troche, Take 1 tablet (10 mg total) by mouth 5 (five) times daily. (Patient taking differently: Take 10 mg by mouth daily as needed (Thrush). ) .  dexlansoprazole (DEXILANT) 60 MG capsule, Take 1 capsule (60 mg total) by mouth daily. .  Diclofenac Sodium 2 % SOLN, Place 2 g onto the skin 2 (two) times daily. .  DULoxetine (CYMBALTA) 30 MG capsule, Take 1 capsule (30 mg total) by mouth daily. With 60mg  for total of 90 mg .  DULoxetine (CYMBALTA) 60 MG capsule, Take 1 capsule (60 mg total) by mouth daily. For total of 90 mg .  famotidine (PEPCID) 20 MG tablet, Take 1-2 tablets (20-40 mg total) by mouth at bedtime. (Patient taking differently: Take 40 mg by mouth at bedtime. ) .  lidocaine-prilocaine (EMLA) cream, Apply 1 application topically every 28 (twenty-eight) days. Apply 1 application to skin ever 28 days .  metoCLOPramide (REGLAN) 10 MG tablet, Take 1 tablet (10 mg total) by mouth 4 (four) times daily. .  Potassium Chloride ER 20 MEQ TBCR, TAKE 1 TABLET BY MOUTH TWICE A DAY (Patient taking differently: Take 20 mEq by mouth daily. ) .  primidone (MYSOLINE) 50 MG tablet, TAKE ONE-HALF TABLET (25 MG TOTAL) BY MOUTH AT BEDTIME. (Patient taking differently: Take 25 mg by mouth at bedtime. ) .  Respiratory Therapy Supplies (FLUTTER) DEVI, Use as directed .  Spacer/Aero-Holding Chambers (AEROCHAMBER MV) inhaler, Use as instructed   Reviewed prior external information including notes and imaging from  primary care provider As well as notes that were available from care  everywhere and other healthcare systems.  Past medical history, social, surgical and family history all reviewed in electronic medical record.  No pertanent information unless stated regarding to the chief complaint.   Review of Systems:  No headache, visual changes, nausea, vomiting, diarrhea, constipation, dizziness, abdominal pain, skin rash, fevers, chills, night sweats, weight loss, swollen lymph nodes,  chest pain, shortness of breath, mood changes. POSITIVE muscle aches, body aches, joint swelling  Objective  Blood pressure (!) 150/90, pulse (!) 195, height 5\' 5"  (1.651 m), weight 208 lb (94.3 kg), SpO2 92 %.   General: No apparent distress alert and oriented x3 mood and affect normal, dressed appropriately.  HEENT: Pupils equal, extraocular movements intact  Respiratory: Patient's speak in full sentences and does not appear short of breath  Cardiovascular: Trace lower extremity edema, non tender, no erythema  Skin: Warm dry intact with no signs of infection or rash on extremities or on axial skeleton.  Abdomen: Soft nontender  Neuro: Cranial nerves II through XII are intact, neurovascularly intact in all extremities with 2+ DTRs and 2+ pulses.  Lymph: No lymphadenopathy of posterior or anterior cervical chain or axillae bilaterally.  Gait mild antalgic MSK:  on tender with full range of motion and good stability and symmetric strength and tone of shoulders, elbows, wrist, hip, knee bilaterally.  Right ankle exam still has some anterior impingement noted.  Some tenderness over the lateral malleolus area.  Negative Thompson.  Limited musculoskeletal ultrasound was performed and interpreted by Alroy Dust  Koren Bound  Limited ultrasound of patient's right ankle shows the patient's lateral malleolus does have a cortical irregularity noted that is consistent with any healing.  Significant decrease in swelling from previous exam and the calcific changes and was improved at this moment.    Impression and Recommendations:     This case required medical decision making of moderate complexity. The above documentation has been reviewed and is accurate and complete Lyndal Pulley, DO       Note: This dictation was prepared with Dragon dictation along with smaller phrase technology. Any transcriptional errors that result from this process are unintentional.

## 2019-05-11 NOTE — Patient Instructions (Signed)
Good to see you  OK to walk on it but need air cast daily for at least 2-3 weeks.  Ice 20 minutes 2 times daily. Usually after activity and before bed. Xray on way out  Of right ankle See em again in 4 weeks

## 2019-05-13 ENCOUNTER — Encounter (HOSPITAL_BASED_OUTPATIENT_CLINIC_OR_DEPARTMENT_OTHER): Payer: Medicare Other | Admitting: Cardiology

## 2019-05-14 ENCOUNTER — Telehealth: Payer: Self-pay | Admitting: *Deleted

## 2019-05-14 LAB — BASIC METABOLIC PANEL
BUN/Creatinine Ratio: 7 — ABNORMAL LOW (ref 9–23)
BUN: 9 mg/dL (ref 6–24)
CO2: 27 mmol/L (ref 20–29)
Calcium: 9.4 mg/dL (ref 8.7–10.2)
Chloride: 101 mmol/L (ref 96–106)
Creatinine, Ser: 1.26 mg/dL — ABNORMAL HIGH (ref 0.57–1.00)
GFR calc Af Amer: 59 mL/min/{1.73_m2} — ABNORMAL LOW (ref >59)
GFR calc non Af Amer: 52 mL/min/{1.73_m2} — ABNORMAL LOW (ref >59)
Glucose: 103 mg/dL — ABNORMAL HIGH (ref 65–99)
Potassium: 3.6 mmol/L (ref 3.5–5.2)
Sodium: 142 mmol/L (ref 134–144)

## 2019-05-14 LAB — PRO B NATRIURETIC PEPTIDE: NT-Pro BNP: 346 pg/mL — ABNORMAL HIGH (ref 0–249)

## 2019-05-14 LAB — CBC
Hematocrit: 41.6 % (ref 34.0–46.6)
Hemoglobin: 13.6 g/dL (ref 11.1–15.9)
MCH: 29 pg (ref 26.6–33.0)
MCHC: 32.7 g/dL (ref 31.5–35.7)
MCV: 89 fL (ref 79–97)
Platelets: 216 10*3/uL (ref 150–450)
RBC: 4.69 x10E6/uL (ref 3.77–5.28)
RDW: 13.5 % (ref 11.7–15.4)
WBC: 8.1 10*3/uL (ref 3.4–10.8)

## 2019-05-14 LAB — ALDOSTERONE + RENIN ACTIVITY W/ RATIO
ALDOS/RENIN RATIO: 18.5 (ref 0.0–30.0)
ALDOSTERONE: 22.8 ng/dL (ref 0.0–30.0)
Renin: 1.23 ng/mL/hr (ref 0.167–5.380)

## 2019-05-14 LAB — TSH: TSH: 1.47 u[IU]/mL (ref 0.450–4.500)

## 2019-05-14 MED ORDER — TORSEMIDE 20 MG PO TABS: 40.0000 mg | ORAL_TABLET | Freq: Every day | ORAL | 1 refills | Status: DC

## 2019-05-14 NOTE — Telephone Encounter (Signed)
Notified the pt that per Dr. Meda Coffee, she wants to increase her torsemide to 40 mg po daily. Confirmed the pharmacy of choice with the pt.  Pt verbalized understanding and agrees with this plan.

## 2019-05-14 NOTE — Telephone Encounter (Signed)
-----   Message from [name], MD sent at 05/14/2019  2:19 PM EST ----- Please increase torsemide to 40 mg po daily

## 2019-05-19 ENCOUNTER — Ambulatory Visit (INDEPENDENT_AMBULATORY_CARE_PROVIDER_SITE_OTHER): Payer: Medicare Other

## 2019-05-19 ENCOUNTER — Encounter: Payer: Self-pay | Admitting: Internal Medicine

## 2019-05-19 ENCOUNTER — Other Ambulatory Visit: Payer: Self-pay

## 2019-05-19 ENCOUNTER — Ambulatory Visit (INDEPENDENT_AMBULATORY_CARE_PROVIDER_SITE_OTHER): Payer: Medicare Other | Admitting: Internal Medicine

## 2019-05-19 ENCOUNTER — Other Ambulatory Visit: Payer: Self-pay | Admitting: Neurology

## 2019-05-19 DIAGNOSIS — R05 Cough: Secondary | ICD-10-CM

## 2019-05-19 DIAGNOSIS — R0600 Dyspnea, unspecified: Secondary | ICD-10-CM

## 2019-05-19 DIAGNOSIS — J45991 Cough variant asthma: Secondary | ICD-10-CM | POA: Diagnosis not present

## 2019-05-19 DIAGNOSIS — R058 Other specified cough: Secondary | ICD-10-CM

## 2019-05-19 DIAGNOSIS — J45909 Unspecified asthma, uncomplicated: Secondary | ICD-10-CM | POA: Diagnosis not present

## 2019-05-19 DIAGNOSIS — R0609 Other forms of dyspnea: Secondary | ICD-10-CM

## 2019-05-19 MED ORDER — PREDNISONE 10 MG PO TABS: ORAL_TABLET | ORAL | 0 refills | Status: DC

## 2019-05-19 NOTE — Addendum Note (Signed)
Addended by: Freada Bergeron on: 05/19/2019 02:32 PM   Modules accepted: Orders

## 2019-05-19 NOTE — Patient Instructions (Addendum)
Work on inhaler technique:  relax and gently blow all the way out then take a nice smooth deep breath back in, triggering the inhaler at same time you start breathing in.  Hold for up to 5 seconds if you can. Blow symbcort  out thru nose. Rinse and gargle with water when done     Try albuterol 15 min before an activity that you know would make you short of breath and see if it makes any difference and if makes none then don't take it after activity unless you can't catch your breath.   Prednisone 10 mg take  4 each am x 2 days,   2 each am x 2 days,  1 each am x 2 days and stop   For runny nose try zyrtec 10 mg one at bedtime will cover 24 hour as needed   Please remember to go to the  x-ray department  for your tests - we will call you with the results when they are available     See Tammy NP w/in 6 weeks with all your medications, even over the counter meds, separated in two separate bags, the ones you take no matter what vs the ones you stop once you feel better and take only as needed when you feel you need them.   Tammy  will generate for you a new user friendly medication calendar that will put [place] all on the same page re: your medication use.     Without this process, it simply isn't possible to assure that we are providing  your outpatient care  with  the attention to detail we feel you deserve.   If we cannot assure that you're getting that kind of care,  then we cannot manage your problem effectively from this clinic.  Once you have seen Tammy and we are sure that we're all on the same page with your medication use she will arrange follow up with me.

## 2019-05-19 NOTE — Progress Notes (Signed)
Spoke with pt and notified of results per Dr. Wert. Pt verbalized understanding and denied any questions. 

## 2019-05-19 NOTE — Progress Notes (Signed)
Subjective:   Patient ID: Erica [name], female     DOB: 1976-01-01    MRN: [id number]    Brief patient profile:  62 yobf never smoker with asthma all her life much worse since around 07/2010 referred by Dr [name] to pulmonary clinic in Sept 2012 Hx of Breast Cancer 05/2008    History of Present Illness  12/18/2010 Initial pulmonary office eval on acei and  advair cc dtc asthma x 4 months but baseline = excess saba use "even when better" at least twice daily despite daily advair, worse at hs in fact  used saba 3 x last night.  On chemo /RT for breast ca but last rx was Jan 2012 well before asthma worse.  To er multiple times >  no better with singular added x 3 months. rec Stop Lisinopril Dulera 200 Take 2 puffs first thing in am and then another 2 puffs about 12 hours later.  Work on inhaler technique  Nexium 40 mg   Take 30-60 min before first meal of the day and Pepcid 20 mg one bedtime until return Only use your albuterol as a rescue medication to be used if you can't catch your breath by resting or doing a relaxed purse lip breathing pattern. The less you use it, the better it will work when you need it.  Prednisone 10 mg take  4 each am x 2 days,   2 each am x 2 days,  1 each am x2days and stop    01/03/2011 f/u ov/Maleni Seyer cc much better off ace and advair but still needing saba twice daily on avg. No  Purulent sputum. rec no change in rx/ rule of 2's reviewed     04/06/2013 NP Follow up and Med Review   We reviewed all her meds and organized them into a med calendar. She does have Medicaid but depends on samples. Did not bring all her meds with her today , says they are at home.  Says her breathing is the same since last ov w/ SOB, Has on /off wheezing, some chest heaviness, prod cough with yellow mucus. Good and bad days. Wears out easily .  Denies f/c/s, nausea, vomiting.  Did not stop singulair as recommended.  rec  Follow med calendar closely and bring to each visit > did not do   We are referring you for a CPSTwith spirometry before and after> never done   04/18/2014 NP Follow up and Med Review / NP  Patient returns for a two-week follow-up and medication review. We reviewed all her medications organize them into a medication count with patient education It appears that she is taking her medications correctly, however, she has over 30 medications and has had several changes recently. Last visit, her [name] was decreased to the 1102mcg says that her breathing was better on the 269mcg Patient had a chest x-ray last visit that showed no acute changes She underwent a CT sinus due to persistent cough , which was positive for sinusitis on the right She was treated with a ten-day course of Avelox, she has one dose left Does feel some better but still has cough . She denies any hemoptysis, orthopnea, PND or leg swelling  Patient has upcoming sleep study. Next week for suspected underlying sleep apnea with snoring and daytime sleepiness rec Refer to ENT for sinusitis and chronic cough > never saw one> f/u sinus CT neg 07/26/14  Increase Dulera 200 2 puffs Twice daily   Follow med calendar  closely and bring to each visit.     08/01/2014 f/u ov/Bhavya Grand re:  Asthma plus noct "gasping"  Chief Complaint  Patient presents with  . Follow-up    doing well.  no complaints.  not doing well x one year with noct / early  4am with cough/ gasping for air almost always goes to neb and then can't go back to sleep even 30 min later better Daytime in chest tightness resolves  completely while on prednisone last took one month prior to OV  And doesn't have it now but has exact same noct symptoms and overusing saba  Doe x mailbox and back to house out of breath and coughing  >>change dulera 200 to 100   08/30/2014 NP Follow up and Med review : Asthma Patient presents for a one-month follow-up We reviewed all her medications organize them into a medication count with patient education Patient  has had multiple medication changes recently She has been having difficulty with resistant hypertension and visual changes. Recently started on primidone and spironolactone . However, has not started this as of yet That she needs to go pick this up at the pharmacy. Neuro notes reviewed , has been referred to NS . B/p is elevated again at todays visit, discussed follow up with PCP for b/p management.  She tried to decrease her Dulera as recommended from last ov with Dr. Melvyn Novas    however, was unable to and is back on 200 dose .  Says overall her breathing is doing ok  No flare of cough or wheezing  Does have a lot of drainage in throat . Discussed starting on chlortrimeton.  rec Continue Dulera 200 2 puffs Twice daily  , rinse after use  Follow med calendar closely and bring to each visit.  Follow up with Primary MD concerning elevated blood pressure    10/05/2014 f/u ov/Shonya Sumida re: asthma with component of uacs/ no med calendar  Chief Complaint  Patient presents with  . Follow-up    Here to discuss meds- Dulera not on her drug formulary. Pt states breathing is doing well and denies any new co's today.  turns out using neb at least once every 24 h, esp between 2 and 4 am when wakes up with severe dry cough x months/ pred helps some but never eliminated noct cough rec symbicort 160 Take 2 puffs first thing in am and then another 2 puffs about 12 hours later.  Try taking the tramadol around 2 am to see if helps with the early am cough/ wheeze  See calendar for specific medication instructions and bring it back for each and every office visit  Separating the top medications from the bottom group is fundamental to providing you adequate care going forward.   Please schedule a follow up office visit in 2 weeks, sooner if needed with all medications in 2 separate bags    10/19/2014 f/u ov/Shiah Berhow re:  Asthma vs uacs/ not using med calendar correclty  Chief Complaint  Patient presents with  .  Follow-up    Cough has changed sicnce starting symbicort- prod cough with "dirty" sputum. She states she feels that she can tell when her PM dose is due b/c she starts feeling SOB. She is using proair once per day on average.   not using symbicort 160 correctly  rec Prednisone 10 mg take  4 each am x 2 days,  2 each am x 2 days,  1 each am x 2 days and stop  Bystolic 10 mg take twice daily until return  Take 2 chlortrimeton at bedtime and 2 more 4 hours later  Continue symbicort 160 Take 2 puffs first thing in am and then another 2 puffs about 12 hours later.  Only use your albuterol as rescue  Only use nebulizer if you try proventil first and it fails to work  Care everywhere notes reviewed from neurosurgery from [facility] , recent right frontal VP shunt  rec at d/c 01/25/16 rec d/c reglan/ hycodan for cough            07/07/2017  f/u ov/Joslyn Ramos re: chronic cough/ asthma/ no med calendar/ no meds / on ACEi but hasn't started  Chief Complaint  Patient presents with  . Follow-up    voiced no concerns   Dyspnea:  MMRC3 = can't walk 100 yards even at a slow pace at a flat grade s stopping due to sob   Cough: some/ 4 am is better / taking Tyl #4 around 11 am  Sleep: better but still sev times a week wakes up coughing around 4 am (was q noct previously)  SABA use:  2 x daily / neb 2-3 per week  Using afrin though not on med cal rec  I emphasized that nasal steroids have no immediate benefit  If not better >  Prednisone 10 mg take  4 each am x 2 days,   2 each am x 2 days,  1 each am x 2 days and stop  If still not better call [facility] at [phone] for CT sinus  See calendar for specific medication instructions and bring it back for each and every office visit for every healthcare provider you see.  Without it,  you may not receive the best quality medical care that we feel you deserve. Separating the top medications from the bottom group is fundamental to providing you adequate care going forward.   Please schedule a follow up office visit in 4 weeks, sooner if needed  with all medications /inhalers/ solutions in hand so we can verify exactly what you are taking. This includes all medications from all doctors and over the counters  - they should be separated like your med calendar in two bags:  Maintenance vs as needed    03/06/2018  f/u ov/Henna Derderian re: pseudoasthma Chief Complaint  Patient presents with  . Follow-up    Increased wheezing "when it got really cold".  She is using her albuterol inhaler and neb both a few x per wk.   Dyspnea:  MMRC3 = can't walk 100 yards even at a slow pace at a flat grade s stopping due to sob   Cough: dry cough on acei Sleeping: 30 degrees on cpap SABA use: a few times a week 02: just cpap   Overt hb despite dexilant  rec Stop lisinopril and asmanex  Start diovan 160 mg at bedtime  Clotrimazole troche up to 5 x daily and use spacer for your symbicort but delay the breath after you trigger    06/05/2018  f/u ov/Cadience Bradfield re: psueudoasthma/ hbp/nonadherent with severe polypharmacy/ not using med calendar  Chief Complaint  Patient presents with  . Follow-up    Breathing has been worse over the past 2 wks. She is using her ventolin 8 x per day and neb about 3 x per day.   Baseline  Few times a week saba/ not able to use cpap more than an hour wakes gagging  With over HB dr Hilarie Fredrickson added Ranitidine helps  hb but not gagging did not improve then started 2 weeks prior to OV   stuffy nose/ cough with sltly brownish secretions sneezing / eyes running more need for albuterol but not helping  tyl #4 was being used 4 x weekly but not on list / now on bid and helps but never takes at hs   Dyspnea:  Ok at rest / across the room   Sleeping: poorly, not using tyl #4 or cpap SABA use: way over using now  rec zpak  Prednisone 10 mg take  4 each am x 2 days,   2 each am x 2 days,  1 each am x 2 days and stop  For cough > mucinex dm 1200 mg twice daily and supplement with  tylenol #4  Every 4 hours and use the flutter valve  Bystolic 20 mg now and daily  Tylenol #4 at bedtime to see if helps cpap See Tammy NP w/in  2 weeks with all your medications and inhalers and pill organizers, even over the counter meds, separated in two separate bags  Without this process, it simply isn't possible to assure that we are providing  your outpatient care  with  the attention to detail we feel you deserve.   If we cannot assure that you're getting that kind of care,  then we cannot manage your problem effectively from this clinic. Once you have seen Tammy and we are sure that we're all on the same page with your medication use she will arrange follow up with me.     01/25/2019 acute extended ov/Jenay Morici re: recurrent cough x 2.5 weeks  Chief Complaint  Patient presents with  . Acute Visit    increased cough and some wheezing- relates to weather change- using albuterol inhaler 2 x per day on average and neb about once per wk.   dyspnea:  Ok if not coughing Cough:  Assoc with subjective wheeze/ sporadic, tan mucus  SABA :  Last neb one day prior,  None on day of ov Sleeping on cpap at 60 degrees chronically ( even before acute symptoms above) No prednisone in months  02  "they are working on getting g it for me to use with cpap" Worse hb since cough  rec zpak Prednisone 10 mg take  4 each am x 2 days,   2 each am x 2 days,  1 each am x 2 days and stop  See calendar for specific medication instructions and bring it back for each and every office visit     05/19/2019  f/u ov/Larry Alcock re:  Cough x years dating back to childhood with dx asthma  Chief Complaint  Patient presents with  . Follow-up    Upper airway cough syndrome  Dyspnea:  MMRC3 = can't walk 100 yards even at a slow pace at a flat grade s stopping due to sob /parents do her shopping due to doe x 2 years  Cough: mostly dry  Sleeping: wakes up gasping air at hs can't tol cpap, ? bipap per Cards working on it  SABA  use:  Once a day when over does it, neb maybe once a week/ never prechallenges  02: awaiting resolution of cpap vs bipap per [place]     No obvious day to day or daytime variability or assoc excess/ purulent sputum or mucus plugs or hemoptysis or cp or chest tightness, subjective wheeze or overt sinus or hb symptoms.    Also denies any obvious fluctuation of symptoms  with weather or environmental changes or other aggravating or alleviating factors except as outlined above   No unusual exposure hx or h/o childhood pna or knowledge of premature birth.  Current Allergies, Complete Past Medical History, Past Surgical History, Family History, and Social History were reviewed in [employer] record.  ROS  The following are not active complaints unless bolded Hoarseness, sore throat, dysphagia, dental problems, itching, sneezing,  nasal congestion or discharge of excess mucus or purulent secretions, ear ache,   fever, chills, sweats, unintended wt loss or wt gain, classically pleuritic or exertional cp,  orthopnea pnd or arm/hand swelling  or leg swelling, presyncope, palpitations, abdominal pain, anorexia, nausea, vomiting, diarrhea  or change in bowel habits or change in bladder habits, change in stools or change in urine, dysuria, hematuria,  rash, arthralgias, visual complaints, headache, numbness, weakness or ataxia or problems with walking or coordination,  change in mood or  memory.        Current Meds- verified with meds/ med calendar   Medication Sig  . acetaminophen-codeine (TYLENOL #4) 300-60 MG tablet TAKE 1 TABLET BY MOUTH EVERY 4 (FOUR) HOURS AS NEEDED FOR MODERATE PAIN  . acetaZOLAMIDE (DIAMOX) 250 MG tablet TAKE 4 TABLETS BY MOUTH 3 TIMES DAILY. (Patient taking differently: Take 2,000 mg by mouth 3 (three) times daily. )  . albuterol (PROVENTIL) (2.5 MG/3ML) 0.083% nebulizer solution USE 1 VIAL VIA NEBULIZER EVERY 4 HOURS AND AS NEEDED FOR WHEEZING OR SHORTNESS OF  BREATH (Patient taking differently: Take 2.5 mg by nebulization every 4 (four) hours as needed for wheezing. )  . albuterol (VENTOLIN HFA) 108 (90 Base) MCG/ACT inhaler INHALE 2 PUFFS BY MOUTH EVERY 4 HOURS AS NEEDED FOR WHEEZE OR FOR SHORTNESS OF BREATH (Patient taking differently: Inhale 1-2 puffs into the lungs every 4 (four) hours as needed for wheezing or shortness of breath. )  . amitriptyline (ELAVIL) 100 MG tablet TAKE 1 TABLET BY MOUTH EVERYDAY AT BEDTIME (Patient taking differently: Take 100 mg by mouth at bedtime. )  . Biotin 5000 MCG TABS Take 5,000 mg by mouth 2 (two) times daily.   . budesonide-formoterol (SYMBICORT) 160-4.5 MCG/ACT inhaler INHALE 2 PUFFS INTO LUNGS 2 TIMES DAILY AS DIRECTED  . Cholecalciferol 1.25 MG (50000 UT) capsule Take 1 capsule (50,000 Units total) by mouth once a week.  . cloNIDine (CATAPRES - DOSED IN MG/24 HR) 0.3 mg/24hr patch PLACE 1 PATCH ONTO SKIN ONCE WEEKLY (Patient taking differently: Place 0.3 mg onto the skin once a week. Sunday)  . clopidogrel (PLAVIX) 75 MG tablet Take 1 tablet (75 mg total) by mouth daily.  . clotrimazole (MYCELEX) 10 MG troche Take 1 tablet (10 mg total) by mouth 5 (five) times daily. (Patient taking differently: Take 10 mg by mouth daily as needed (Thrush). )  . dexlansoprazole (DEXILANT) 60 MG capsule Take 1 capsule (60 mg total) by mouth daily.  Marland Kitchen dextromethorphan-guaiFENesin (MUCINEX DM) 30-600 MG 12hr tablet Take 2 tablets by mouth 2 (two) times daily.   . Diclofenac Sodium 2 % SOLN Place 2 g onto the skin 2 (two) times daily.  Marland Kitchen diltiazem (CARDIZEM LA) 420 MG 24 hr tablet Take 1 tablet (420 mg total) by mouth daily.  Marland Kitchen doxazosin (CARDURA) 4 MG tablet Take 1 tablet (4 mg total) by mouth daily.  . DULoxetine (CYMBALTA) 30 MG capsule Take 1 capsule (30 mg total) by mouth daily. With 60mg  for total of 90 mg  . DULoxetine (CYMBALTA) 60 MG capsule Take 1  capsule (60 mg total) by mouth daily. For total of 90 mg  . EPINEPHRINE 0.3  mg/0.3 mL IJ SOAJ injection INJECT 0.3 MLS INTO THE MUSCLE ONCE AS NEEDED FOR ANAPHYLAXIS (Patient taking differently: Inject 0.3 mg into the muscle as needed for anaphylaxis. )  . famotidine (PEPCID) 20 MG tablet Take 1-2 tablets (20-40 mg total) by mouth at bedtime. (Patient taking differently: Take 40 mg by mouth at bedtime. )  . hydrALAZINE (APRESOLINE) 100 MG tablet TAKE 1 TABLET (100 MG TOTAL) BY MOUTH 3 (THREE) TIMES DAILY.  Marland Kitchen lidocaine-prilocaine (EMLA) cream Apply 1 application topically every 28 (twenty-eight) days. Apply 1 application to skin ever 28 days  . metoCLOPramide (REGLAN) 10 MG tablet Take 1 tablet (10 mg total) by mouth 4 (four) times daily.  . montelukast (SINGULAIR) 10 MG tablet Take 1 tablet (10 mg total) by mouth at bedtime.  . Nebivolol HCl 20 MG TABS Take 2 tablets (40 mg total) by mouth daily.  Marland Kitchen NITROSTAT 0.4 MG SL tablet PLACE 1 TABLET UNDER TONGUE EVERY 5 MINUTES AS NEEDED FOR CHEST PAIN, MAX OF 3 TABLETS THEN CALL 911 IF PAIN PERSISTS (Patient taking differently: Place 0.4 mg under the tongue every 5 (five) minutes as needed for chest pain. )  . oxymetazoline (MUCINEX SINUS-MAX FULL FORCE) 0.05 % nasal spray Place 1 spray into both nostrils 2 (two) times daily.   . Potassium Chloride ER 20 MEQ TBCR TAKE 1 TABLET BY MOUTH TWICE A DAY (Patient taking differently: Take 20 mEq by mouth daily. )  . primidone (MYSOLINE) 50 MG tablet TAKE ONE-HALF TABLET (25 MG TOTAL) BY MOUTH AT BEDTIME. (Patient taking differently: Take 25 mg by mouth at bedtime. )  . promethazine (PHENERGAN) 25 MG suppository Place 25 mg rectally every 6 (six) hours as needed for nausea or vomiting.  . promethazine (PHENERGAN) 25 MG tablet Take 1 tablet (25 mg total) by mouth 3 (three) times daily as needed for nausea or vomiting.  . ranolazine (RANEXA) 500 MG 12 hr tablet TAKE 1 TABLET BY MOUTH TWICE A DAY (Patient taking differently: Take 500 mg by mouth 2 (two) times daily. )  . Respiratory Therapy  Supplies (FLUTTER) DEVI Use as directed  . rosuvastatin (CRESTOR) 40 MG tablet Take 1 tablet (40 mg total) by mouth daily.  Marland Kitchen Spacer/Aero-Holding Chambers (AEROCHAMBER MV) inhaler Use as instructed  . spironolactone (ALDACTONE) 100 MG tablet TAKE 1 TABLET BY MOUTH EVERY DAY (Patient taking differently: Take 100 mg by mouth daily. )  . torsemide (DEMADEX) 20 MG tablet Take 2 tablets (40 mg total) by mouth daily.  . valsartan (DIOVAN) 320 MG tablet TAKE 1 TABLET BY MOUTH EVERY DAY (Patient taking differently: Take 320 mg by mouth daily. )             Objective:  Physical Exam      amb obese bf nad   Vital signs reviewed  05/19/2019  - Note at rest 02 sats  98% on RA      05/19/2019   207  01/25/2019   202 Wt 213  12/18/10 > 01/03/2011  214 > 01/31/2011  206 >  09/11/2012 209 >208 6/19 > 210 09/25/2012 > 11/20/2012 211 >208 12/07/12> 01/12/2013 209 > 02/09/2013 210 >207 04/06/2013 > 07/14/2013  213>212 07/28/2013 >204 01/26/2014> 04/04/2014 206  >205 04/18/2014 >  07/18/2014 201 >  08/01/14 201 >198 08/30/2014 > 10/05/14  195 > 10/19/2014 192 > 191 11/08/2014 > 02/10/2015  198 > 03/24/2015   198 >  06/29/2015   201 > 08/31/2015  204 > 11/17/2015  197 > 12/15/2015  188 > 03/22/2016   188 > 07/07/2017  198 > 03/06/2018  199  > 06/05/2018  200       HEENT : pt wearing mask not removed for exam due to covid -19 concerns.    NECK :  without JVD/Nodes/TM/ nl carotid upstrokes bilaterally   LUNGS: no acc muscle use,  Nl contour chest which is clear to A and P bilaterally without cough on insp or exp maneuvers   CV:  RRR  no s3 or murmur or increase in P2, and no edema   ABD: Obese/ soft and nontender with nl inspiratory excursion in the supine position. No bruits or organomegaly appreciated, bowel sounds nl  MS:  Nl gait/ ext warm without deformities, calf tenderness, cyanosis or clubbing No obvious joint restrictions   SKIN: warm and dry without lesions    NEURO:  alert, approp, nl sensorium with  no motor or  cerebellar deficits apparent.

## 2019-05-20 ENCOUNTER — Telehealth: Payer: Self-pay | Admitting: *Deleted

## 2019-05-20 ENCOUNTER — Encounter: Payer: Self-pay | Admitting: Internal Medicine

## 2019-05-20 ENCOUNTER — Other Ambulatory Visit: Payer: Self-pay

## 2019-05-20 DIAGNOSIS — G4733 Obstructive sleep apnea (adult) (pediatric): Secondary | ICD-10-CM

## 2019-05-20 NOTE — Telephone Encounter (Addendum)
Antonieta Iba, RN  You; Freada Bergeron, CMA 18 minutes ago (8:54 AM)   Orders have bene placed for overnight pulse ox   Message text    You routed conversation to Antonieta Iba, RN; Freada Bergeron, CMA 16 hours ago (4:35 PM)   [name], MD  You; Skeet Latch, MD; [name], MD 6 days ago   Unfortunately she did not have any nocturnal hypoxemia on her sleep study . I am happy to order an overnight pulse ox but cannot write for O2 unless she has hypoxemia a night   [name]   Message text    Pt made aware via mychart message that Dr. Theodosia Blender RN has placed the order for her to get an overnight pulse ox so that we can hopefully prove the need for oxygen at nighttime.  Pt aware via mychart message to Dr. Theodosia Blender Sleep Coordinator [name] will be in contact with her to arrange this test.

## 2019-05-20 NOTE — Assessment & Plan Note (Addendum)
Onset reported in childhood  - PFT's 01/31/2011 FEV1  1.76 (60% ) ratio 68 and no better p B2,  DLCO 70% -med review 09/25/2012 > Dulera drug assistance  paperwork given    - HFA 90% 11/20/2012 .  -Med calendar 12/07/12 , 04/06/2013 , 04/18/2014,, 08/30/2014 , 11/08/2014  - PFT's 01/12/2013  1.73 (65%)  Ratio 73 and no better p B2, DLCO 86% - trial off singulair 02/08/13 >>>did not stop  - 04/05/2014  trial of dulera 100 instead of 200 > worse  -04/18/2014 increase Dulera 200  - 08/01/2014 p extensive coaching HFA effectiveness =    90% - 08/01/2014  Walked RA x 2laps @ 185 ft each stopped due to cough > sob/ no desat moderate pace  - spirometry 08/01/2014  FEV1  1.87 (70%) ratio 78  fef 25-75 53%  - spirometry 02/10/2015  flattening of top portion of f/v loop only    - 02/10/2015   Walked RA  2 laps @ 185 ft each stopped due to cough > sob/ slow pace   -  NO 03/24/2015   =  16 - Methacholine challenge 04/04/2015   POS asthma = wheeze/sob but no coughing fits > added qvar 80 2bid 06/29/2015  - Allergy profile 11/17/2015 >  Eos 0.1 /  IgE  80 POS RAST  to grass, ragweed, cedar tree    - Spirometry 08/31/2015  FEV1 0.75 (28%)  Ratio 68  - FENO 11/17/2015  =  8  on symb 160 2bid and qvar 80 2bid  - FENO 03/22/2016  = 36 on symb 160/qvar 80 though not able to verify being used as directed  - 05/19/2019  After extensive coaching inhaler device,  effectiveness =    75% (short Ti)    rec golfer analogy to try using an empty cannister for some practice breaths each time prior to using the real inhaler.   rec Prednisone 10 mg take  4 each am x 2 days,   2 each am x 2 days,  1 each am x 2 days and stop   Rec: I spent extra time with pt today reviewing appropriate use of albuterol for prn use on exertion with the following points: 1) saba is for relief of sob that does not improve by walking a slower pace or resting but rather if the pt does not improve after trying this first. 2) If the pt is convinced, as many are, that  saba helps recover from activity faster then it's easy to tell if this is the case by re-challenging : ie stop, take the inhaler, then p 5 minutes try the exact same activity (intensity of workload) that just caused the symptoms and see if they are substantially diminished or not after saba 3) if there is an activity that reproducibly causes the symptoms, try the saba 15 min before the activity on alternate days   If in fact the saba really does help, then fine to continue to use it prn but advised may need to look closer at the maintenance regimen being used to achieve better control of airways disease with exertion.   >>> f/u for new med cal 6 weeks as multiple edits to present one      Each maintenance medication was reviewed in detail including most importantly the difference between maintenance and as needed and under what circumstances the prns are to be used. This was done in the context of a medication calendar review which provided the patient with  a user-friendly unambiguous mechanism for medication administration and reconciliation and provides an action plan for all active problems. It is critical that this be shown to every doctor  for modification during the office visit if necessary so the patient can use it as a working document.

## 2019-05-20 NOTE — Progress Notes (Signed)
Unfortunately she did not have any nocturnal hypoxemia on her sleep study . I am happy to order an overnight pulse ox but cannot write for O2 unless she has hypoxemia a night   [name]   Message text    Order placed for overnight pulse ox per Dr. Radford Pax.

## 2019-05-20 NOTE — Assessment & Plan Note (Signed)
-   05/19/2019   Walked RA x two laps =  approx 513ft @ nl pace - stopped due to cough/sob x 2  with sats of 98 % at the end of the study.  Clearly multifactorial but no evidence of active wheeze/ desaturation limiting ex tol at this point so no change in maint rx needed.             Total time for H and P, chart review, counseling,  directly observing portions of ambulatory 02 saturation study/  generating customized AVS unique to this office visit / charting = 30 min

## 2019-05-20 NOTE — Assessment & Plan Note (Signed)
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 Uri/ poorly controlled GERD - flare mid Oct 2020 with uri / assoc secondary gerd  Continues to req Tyl #4 / reviewed cyclical cough control again making sure she understands this is a last resort and should first max mucinex dm/ add zyrtec for pnds and if not effective will change to 1st gen H1 blockers per guidelines .

## 2019-05-20 NOTE — Telephone Encounter (Signed)
Order placed to [facility] via community message. 

## 2019-05-20 NOTE — Telephone Encounter (Signed)
-----   Message from Antonieta Iba, RN sent at 05/20/2019  9:12 AM EST ----- Regarding: Overnight pulse ox Overnight pulse ox has been ordered.  Thanks!

## 2019-05-21 ENCOUNTER — Other Ambulatory Visit (HOSPITAL_COMMUNITY): Payer: Medicare Other

## 2019-05-23 ENCOUNTER — Encounter (HOSPITAL_BASED_OUTPATIENT_CLINIC_OR_DEPARTMENT_OTHER): Payer: Medicare Other | Admitting: Cardiology

## 2019-05-26 ENCOUNTER — Other Ambulatory Visit: Payer: Self-pay

## 2019-05-26 ENCOUNTER — Inpatient Hospital Stay: Payer: Medicare Other | Attending: Oncology

## 2019-05-26 ENCOUNTER — Encounter: Payer: Self-pay | Admitting: Cardiology

## 2019-05-26 VITALS — BP 152/78 | HR 108 | Temp 98.2°F

## 2019-05-26 DIAGNOSIS — Z79818 Long term (current) use of other agents affecting estrogen receptors and estrogen levels: Secondary | ICD-10-CM | POA: Diagnosis present

## 2019-05-26 DIAGNOSIS — C50412 Malignant neoplasm of upper-outer quadrant of left female breast: Secondary | ICD-10-CM | POA: Diagnosis not present

## 2019-05-26 MED ORDER — GOSERELIN ACETATE 3.6 MG ~~LOC~~ IMPL
3.6000 mg | DRUG_IMPLANT | SUBCUTANEOUS | Status: DC
  Administered 2019-05-26: 09:00:00 3.6 mg via SUBCUTANEOUS

## 2019-05-26 MED ORDER — GOSERELIN ACETATE 3.6 MG ~~LOC~~ IMPL
DRUG_IMPLANT | SUBCUTANEOUS | Status: AC
  Filled 2019-05-26: qty 3.6

## 2019-05-26 NOTE — Patient Instructions (Signed)

## 2019-05-28 ENCOUNTER — Other Ambulatory Visit: Payer: Self-pay | Admitting: Internal Medicine

## 2019-05-28 ENCOUNTER — Telehealth: Payer: Self-pay | Admitting: Licensed Clinical Social Worker

## 2019-05-28 NOTE — Telephone Encounter (Signed)
CSW called pt to check in regarding multiple resources that we discussed at the beginning of the month.  Pt reports that her [facility] Medicaid has not switched to [place] yet but that her case worker was handling that and she is hopeful to hear back soon.  Also states she applied for food stamps as we discussed but has not heard a determination at this time.  Pt still working on housing and is going to apply for Section 8 voucher in [place], [place] as she reports that waitlist is now open.  Pt reports no further concerns at this time and is hopeful that she will hear regarding outstanding applications soon.  CSW encouraged pt to reach out with any concerns or questions  Jorge Ny, [facility] Desk#: [phone] Cell#: [phone]

## 2019-06-03 ENCOUNTER — Ambulatory Visit (INDEPENDENT_AMBULATORY_CARE_PROVIDER_SITE_OTHER): Payer: Medicare Other | Admitting: Pharmacist Clinician (PhC)/ Clinical Pharmacy Specialist

## 2019-06-03 ENCOUNTER — Other Ambulatory Visit: Payer: Self-pay

## 2019-06-03 DIAGNOSIS — I10 Essential (primary) hypertension: Secondary | ICD-10-CM | POA: Diagnosis not present

## 2019-06-03 MED ORDER — DOXAZOSIN MESYLATE 8 MG PO TABS: 8.0000 mg | ORAL_TABLET | Freq: Every day | ORAL | 3 refills | Status: DC

## 2019-06-03 NOTE — Progress Notes (Signed)
06/04/2019 Erica [name] 1975-10-26 [id number]   HPI:  Erica [name] is a 44 y.o. female patient of Dr Oval Linsey, with a [place] below who presents today for hypertension clinic follow up.  She was seen by Dr. Oval Linsey in early February and found to have a BP of 207/98.  At that time she was on 5 medications, including clonidine 0.3 mg patch, diltiazem 420 mg, hydralazine 123XX123 mg tid, Bystolic 40 mg, spironolactone 100 mg and valsartan 320 mg, all at maximum doses.  Dr. Blenda Mounts nurse called her pharmacy to check compliance and the following was noted:  Clonidine patch - last filled 90 days in October 2020  Hydralazine 100 mg - last filled 90 days in January 2021  Torsemide and valsartan last filled for 90 days in July 2020   As of Feb 5 she had never filled the spironolactone or Bystolic  When confronted by Dr. Oval Linsey, pt stated that her pulmonologist always gives samples of Bystolic, and that otherwise the pharmacy is incorrect with its data.  (of note since being prescribed rosuvastatin  In 2019, her LDL has remained essentially unchanged).  At that visit doxazosin 4 mg daily was added.    Patients labs have almost always indicated a low potassium level despite being on 100 mg of spironolactone and 320 mg valsartan.  Spoke with pharmacist at [facility] in [place] to review compliance.  He notes that she picked up clonidine patches, valsartan and torsemide in February.  The diltiazem 420 mg was last filled in December 2019.  She did also pick up the doxazosin 4 mg in February and told him she would be back next week to get the higher strength, as the store was out of stock and she still had some 4 mg tablets.   Past Medical History: CHF Chronic diastolic EF  asthma Followed by Dr. 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    Potential secondary causes of hypertension:  Renal artery stenosis - renal dopplers 09/2018 showed 1-59% stenosis on both  left and right  Hyperaldosteronism - renin and aldosterone WNL 04/2019  Hyper/hypo thyroidism - TSH WNL   Sleep apnea - intolerant to CPAP  Herbals/drugs - may need to review  Blood Pressure Goal:  130/80  Current Medications:  Bystolic 40 mg qd, Clonidine 0.3 mg patch (QSunday), hydralazine 100 mg tid, diltiazem 420 mg qd, spironolactone 100 mg qd, valsartan 320 mg qd  Family Hx: falther deceased, no connection with family; no concerns with mother, currently 54; 1 sister (whole) smokes but no health issues; 4 children all healthy at  25,22,12,8;   Social Hx:  No tobacco, no alcohol; 32 oz Pepsi daily; caffeine relieves pressure from shunt (pseudotumor cerebri)  Diet:  Home cooked meals; likes vegetables (zuccini, squash, bro/caul, salad);  only canned corn; occaional hamburer/sterakk, mostly chicken or seafood  Exercise: difficult to exercise d/t breathing issues  Home BP readings: no readings with her today  Intolerances: metoprolol caused hives  Labs:  1/29:  Na 142, K 3.6, Glu 103, BUN 9, SCr 1.26 GFR 59  Of note, potassium checked just a week later and found to be low at 3.3 (despite being on valsartan 320 and spironolactone 100)  Wt Readings from Last 3 Encounters:  06/03/19 205 lb (93 kg)  05/19/19 207 lb (93.9 kg)  05/11/19 208 lb (94.3 kg)   BP Readings from Last 3 Encounters:  06/03/19 (!) 200/96  05/26/19 (!) 152/78  05/19/19 Marland Kitchen)  144/84   Pulse Readings from Last 3 Encounters:  06/03/19 (!) 112  05/26/19 (!) 108  05/19/19 (!) 103    Current Outpatient Medications  Medication Sig Dispense Refill  . acetaminophen-codeine (TYLENOL #4) 300-60 MG tablet TAKE 1 TABLET BY MOUTH EVERY 4 (FOUR) HOURS AS NEEDED FOR MODERATE PAIN 30 tablet 0  . acetaZOLAMIDE (DIAMOX) 250 MG tablet TAKE 4 TABLETS BY MOUTH 3 TIMES DAILY. (Patient taking differently: Take 2,000 mg by mouth 3 (three) times daily. ) 90 tablet 1  . albuterol (PROVENTIL) (2.5 MG/3ML) 0.083% nebulizer solution USE  1 VIAL VIA NEBULIZER EVERY 4 HOURS AND AS NEEDED FOR WHEEZING OR SHORTNESS OF BREATH (Patient taking differently: Take 2.5 mg by nebulization every 4 (four) hours as needed for wheezing. ) 75 mL 1  . albuterol (VENTOLIN HFA) 108 (90 Base) MCG/ACT inhaler INHALE 2 PUFFS BY MOUTH EVERY 4 HOURS AS NEEDED FOR WHEEZE OR FOR SHORTNESS OF BREATH (Patient taking differently: Inhale 1-2 puffs into the lungs every 4 (four) hours as needed for wheezing or shortness of breath. ) 18 Inhaler 1  . amitriptyline (ELAVIL) 100 MG tablet TAKE 1 TABLET BY MOUTH EVERYDAY AT BEDTIME (Patient taking differently: Take 100 mg by mouth at bedtime. ) 90 tablet 0  . Biotin 5000 MCG TABS Take 5,000 mg by mouth 2 (two) times daily.     . Cholecalciferol 1.25 MG (50000 UT) capsule Take 1 capsule (50,000 Units total) by mouth once a week. 12 capsule 1  . cloNIDine (CATAPRES - DOSED IN MG/24 HR) 0.3 mg/24hr patch PLACE 1 PATCH ONTO SKIN ONCE WEEKLY (Patient taking differently: Place 0.3 mg onto the skin once a week. Sunday) 12 patch 1  . clopidogrel (PLAVIX) 75 MG tablet Take 1 tablet (75 mg total) by mouth daily. 90 tablet 3  . clotrimazole (MYCELEX) 10 MG troche Take 1 tablet (10 mg total) by mouth 5 (five) times daily. (Patient taking differently: Take 10 mg by mouth daily as needed (Thrush). ) 30 tablet 11  . dexlansoprazole (DEXILANT) 60 MG capsule Take 1 capsule (60 mg total) by mouth daily. 90 capsule 3  . dextromethorphan-guaiFENesin (MUCINEX DM) 30-600 MG 12hr tablet Take 2 tablets by mouth 2 (two) times daily.     . Diclofenac Sodium 2 % SOLN Place 2 g onto the skin 2 (two) times daily. 112 g 3  . diltiazem (CARDIZEM LA) 420 MG 24 hr tablet Take 1 tablet (420 mg total) by mouth daily. 90 tablet 0  . doxazosin (CARDURA) 8 MG tablet Take 1 tablet (8 mg total) by mouth daily. 90 tablet 3  . DULoxetine (CYMBALTA) 30 MG capsule Take 1 capsule (30 mg total) by mouth daily. With 60mg  for total of 90 mg 30 capsule 1  . DULoxetine  (CYMBALTA) 60 MG capsule Take 1 capsule (60 mg total) by mouth daily. For total of 90 mg 30 capsule 1  . EPINEPHRINE 0.3 mg/0.3 mL IJ SOAJ injection INJECT 0.3 MLS INTO THE MUSCLE ONCE AS NEEDED FOR ANAPHYLAXIS (Patient taking differently: Inject 0.3 mg into the muscle as needed for anaphylaxis. ) 2 Device 2  . famotidine (PEPCID) 20 MG tablet Take 1-2 tablets (20-40 mg total) by mouth at bedtime. (Patient taking differently: Take 40 mg by mouth at bedtime. ) 180 tablet 1  . hydrALAZINE (APRESOLINE) 100 MG tablet TAKE 1 TABLET (100 MG TOTAL) BY MOUTH 3 (THREE) TIMES DAILY. 270 tablet 1  . lidocaine-prilocaine (EMLA) cream Apply 1 application topically every 28 (twenty-eight) days.  Apply 1 application to skin ever 28 days    . metoCLOPramide (REGLAN) 10 MG tablet Take 1 tablet (10 mg total) by mouth 4 (four) times daily. 120 tablet 2  . montelukast (SINGULAIR) 10 MG tablet Take 1 tablet (10 mg total) by mouth at bedtime. 90 tablet 1  . Nebivolol HCl 20 MG TABS Take 2 tablets (40 mg total) by mouth daily. 180 tablet 3  . NITROSTAT 0.4 MG SL tablet PLACE 1 TABLET UNDER TONGUE EVERY 5 MINUTES AS NEEDED FOR CHEST PAIN, MAX OF 3 TABLETS THEN CALL 911 IF PAIN PERSISTS (Patient taking differently: Place 0.4 mg under the tongue every 5 (five) minutes as needed for chest pain. ) 100 tablet 0  . oxymetazoline (MUCINEX SINUS-MAX FULL FORCE) 0.05 % nasal spray Place 1 spray into both nostrils 2 (two) times daily.     . Potassium Chloride ER 20 MEQ TBCR TAKE 1 TABLET BY MOUTH TWICE A DAY (Patient taking differently: Take 20 mEq by mouth daily. ) 180 tablet 0  . predniSONE (DELTASONE) 10 MG tablet Take  4 each am x 2 days,   2 each am x 2 days,  1 each am x 2 days and stop 14 tablet 0  . primidone (MYSOLINE) 50 MG tablet TAKE ONE-HALF TABLET (25 MG TOTAL) BY MOUTH AT BEDTIME. (Patient taking differently: Take 25 mg by mouth at bedtime. ) 45 tablet 1  . promethazine (PHENERGAN) 25 MG suppository Place 25 mg rectally  every 6 (six) hours as needed for nausea or vomiting.    . promethazine (PHENERGAN) 25 MG tablet Take 1 tablet (25 mg total) by mouth 3 (three) times daily as needed for nausea or vomiting. 270 tablet 1  . ranolazine (RANEXA) 500 MG 12 hr tablet TAKE 1 TABLET BY MOUTH TWICE A DAY (Patient taking differently: Take 500 mg by mouth 2 (two) times daily. ) 180 tablet 2  . Respiratory Therapy Supplies (FLUTTER) DEVI Use as directed 1 each 0  . rosuvastatin (CRESTOR) 40 MG tablet Take 1 tablet (40 mg total) by mouth daily. 90 tablet 2  . Spacer/Aero-Holding Chambers (AEROCHAMBER MV) inhaler Use as instructed 1 each 0  . spironolactone (ALDACTONE) 100 MG tablet TAKE 1 TABLET BY MOUTH EVERY DAY (Patient taking differently: Take 100 mg by mouth daily. ) 90 tablet 2  . SYMBICORT 160-4.5 MCG/ACT inhaler INHALE 2 PUFFS INTO LUNGS 2 TIMES DAILY AS DIRECTED 10.2 g 11  . torsemide (DEMADEX) 20 MG tablet Take 2 tablets (40 mg total) by mouth daily. 180 tablet 1  . valsartan (DIOVAN) 320 MG tablet TAKE 1 TABLET BY MOUTH EVERY DAY (Patient taking differently: Take 320 mg by mouth daily. ) 90 tablet 1   No current facility-administered medications for this visit.    Allergies  Allergen Reactions  . Aspirin Shortness Of Breath and Palpitations    Pt can take ibuprofen without reaction  . Doxycycline Anaphylaxis  . Kiwi Extract Itching and Swelling    Lip swelling  . Metoprolol Hives and Itching  . Penicillins Shortness Of Breath and Palpitations    Has patient had a PCN reaction causing immediate rash, facial/tongue/throat swelling, SOB or lightheadedness with hypotension: Yes Has patient had a PCN reaction causing severe rash involving mucus membranes or skin necrosis: Yes Has patient had a PCN reaction that required hospitalization Yes Has patient had a PCN reaction occurring within the last 10 years: No If all of the above answers are "NO", then may proceed with Cephalosporin use.  Marland Kitchen  Strawberry Extract  Hives  . Sulfamethoxazole-Trimethoprim Anaphylaxis    Facial, throat and tongue swelling with difficulty swallowing.    Past Medical History:  Diagnosis Date  . Allergy   . Anxiety   . Arthritis   . Asthma   . Blood transfusion without reported diagnosis   . Breast cancer ([place]) 2010   a. locally advanced left breast carcinoma diagnosed in 2010 and treated with neoadjuvant chemotherapy with docetaxel and cyclophosphamide as well as paclitaxel. This was followed by radiation therapy which was completed in 2011.  Marland Kitchen CHF (congestive heart failure) ([place])   . Chronic kidney disease   . Dyspnea on exertion    a. 01/2013 Lexi MV: EF 54%, no ischemia/infarct;  b. 05/2013 Echo: EF 60-65%, no rwma, Gr 2 DD;  b.   . Fatty liver   . Gastroparesis   . GERD (gastroesophageal reflux disease)   . Hypertension   . Impaired glucose tolerance 04/13/2011  . Lymphadenitis, chronic    restricted LEFT extremity  . 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 OSA (obstructive sleep apnea) 05/04/2014   AHI 31/hr now on CPAP at 12cm H2O  . Oxygen deficiency    Uses 02 in CPAP  . Personal history of chemotherapy 2010  . Personal history of radiation therapy 2010  . Sleep apnea     Blood pressure (!) 200/96, pulse (!) 112, weight 205 lb (93 kg).  Essential hypertension Patient with uncontrolled hypertension despite 6 meds, 5 at maximum doses.  Will increase doxazosin to 8 mg daily, however I don't suspect this will drop her pressure significantly.  Had conversation with [facility]ist at [facility].  Reviewed refill data and he offered to have a conversation with patient as he feels they have a good repore.  Patient was encouraged to bring home cuff, list of home readings and all her medications to her next visit in 1 month.     Tommy Medal PharmD CPP [facility] [address] [place], [place] [address] [phone]

## 2019-06-03 NOTE — Patient Instructions (Signed)
Return for a a follow up appointment in   Your blood pressure today is 200/96  Check your blood pressure at home daily and keep record of the readings.  Take your BP meds as follows:  Increase doxazosin to 8 mg daily and take only at bedtime.  Continue with all other medications  Bring all of your meds, your BP cuff and your record of home blood pressures to your next appointment.  Exercise as you're able, try to walk approximately 30 minutes per day.  Keep salt intake to a minimum, especially watch canned and prepared boxed foods.  Eat more fresh fruits and vegetables and fewer canned items.  Avoid eating in fast food restaurants.    HOW TO TAKE YOUR BLOOD PRESSURE: . Rest 5 minutes before taking your blood pressure. .  Don't smoke or drink caffeinated beverages for at least 30 minutes before. . Take your blood pressure before (not after) you eat. . Sit comfortably with your back supported and both feet on the floor (don't cross your legs). . Elevate your arm to heart level on a table or a desk. . Use the proper sized cuff. It should fit smoothly and snugly around your bare upper arm. There should be enough room to slip a fingertip under the cuff. The bottom edge of the cuff should be 1 inch above the crease of the elbow. . Ideally, take 3 measurements at one sitting and record the average.

## 2019-06-04 ENCOUNTER — Other Ambulatory Visit (HOSPITAL_COMMUNITY)
Admission: RE | Admit: 2019-06-04 | Discharge: 2019-06-04 | Disposition: A | Discharge status: Home / Self Care | Payer: Medicare Other | Source: Ambulatory Visit | Attending: Cardiology | Admitting: Cardiology

## 2019-06-04 ENCOUNTER — Other Ambulatory Visit: Payer: Medicare Other | Admitting: *Deleted

## 2019-06-04 ENCOUNTER — Encounter: Payer: Self-pay | Admitting: Pharmacist Clinician (PhC)/ Clinical Pharmacy Specialist

## 2019-06-04 DIAGNOSIS — Z20822 Contact with and (suspected) exposure to covid-19: Secondary | ICD-10-CM | POA: Diagnosis not present

## 2019-06-04 DIAGNOSIS — I2510 Atherosclerotic heart disease of native coronary artery without angina pectoris: Secondary | ICD-10-CM

## 2019-06-04 DIAGNOSIS — Z01812 Encounter for preprocedural laboratory examination: Secondary | ICD-10-CM | POA: Diagnosis present

## 2019-06-04 DIAGNOSIS — E785 Hyperlipidemia, unspecified: Secondary | ICD-10-CM

## 2019-06-04 DIAGNOSIS — G8929 Other chronic pain: Secondary | ICD-10-CM

## 2019-06-04 LAB — LIPID PANEL
Chol/HDL Ratio: 4.2 ratio (ref 0.0–4.4)
Cholesterol, Total: 176 mg/dL (ref 100–199)
HDL: 42 mg/dL (ref >39)
LDL Chol Calc (NIH): 118 mg/dL — ABNORMAL HIGH (ref 0–99)
Triglycerides: 86 mg/dL (ref 0–149)
VLDL Cholesterol Cal: 16 mg/dL (ref 5–40)

## 2019-06-04 LAB — HEPATIC FUNCTION PANEL
ALT: 23 IU/L (ref 0–32)
AST: 18 IU/L (ref 0–40)
Albumin: 3.9 g/dL (ref 3.8–4.8)
Alkaline Phosphatase: 62 IU/L (ref 39–117)
Bilirubin Total: 0.6 mg/dL (ref 0.0–1.2)
Bilirubin, Direct: 0.15 mg/dL (ref 0.00–0.40)
Total Protein: 6.6 g/dL (ref 6.0–8.5)

## 2019-06-04 LAB — SARS CORONAVIRUS 2 (TAT 6-24 HRS): SARS Coronavirus 2: NEGATIVE

## 2019-06-04 NOTE — Assessment & Plan Note (Signed)
Patient with uncontrolled hypertension despite 6 meds, 5 at maximum doses.  Will increase doxazosin to 8 mg daily, however I don't suspect this will drop her pressure significantly.  Had conversation with [facility]ist at [facility].  Reviewed refill data and he offered to have a conversation with patient as he feels they have a good repore.  Patient was encouraged to bring home cuff, list of home readings and all her medications to her next visit in 1 month.

## 2019-06-06 ENCOUNTER — Other Ambulatory Visit: Payer: Self-pay

## 2019-06-06 ENCOUNTER — Ambulatory Visit (HOSPITAL_BASED_OUTPATIENT_CLINIC_OR_DEPARTMENT_OTHER): Payer: Medicare Other | Attending: Cardiology | Admitting: Cardiology

## 2019-06-06 DIAGNOSIS — G4733 Obstructive sleep apnea (adult) (pediatric): Secondary | ICD-10-CM

## 2019-06-08 ENCOUNTER — Ambulatory Visit (INDEPENDENT_AMBULATORY_CARE_PROVIDER_SITE_OTHER): Payer: Medicare Other | Admitting: Family Medicine

## 2019-06-08 ENCOUNTER — Ambulatory Visit (INDEPENDENT_AMBULATORY_CARE_PROVIDER_SITE_OTHER): Payer: Medicare Other

## 2019-06-08 ENCOUNTER — Telehealth: Payer: Self-pay | Admitting: *Deleted

## 2019-06-08 ENCOUNTER — Encounter: Payer: Self-pay | Admitting: Family Medicine

## 2019-06-08 VITALS — BP 142/98 | HR 103 | Ht 66.0 in | Wt 208.0 lb

## 2019-06-08 DIAGNOSIS — M76822 Posterior tibial tendinitis, left leg: Secondary | ICD-10-CM

## 2019-06-08 DIAGNOSIS — G8929 Other chronic pain: Secondary | ICD-10-CM

## 2019-06-08 DIAGNOSIS — M25571 Pain in right ankle and joints of right foot: Secondary | ICD-10-CM | POA: Diagnosis not present

## 2019-06-08 MED ORDER — EZETIMIBE 10 MG PO TABS: 10.0000 mg | ORAL_TABLET | Freq: Every day | ORAL | 1 refills | Status: DC

## 2019-06-08 NOTE — Assessment & Plan Note (Addendum)
Patient appears to be doing fairly well overall.  Discussed with patient about icing regimen and home exercise.  Discussed switching to doing which wants to avoid.  Patient should increase activity as tolerated.  Follow-up again in 4 to 8 weeks this is a chronic problem that seems to be stable.  Many other comorbidities makes it difficult for treatment and patient social determinants of health secondary to having difficulty with transportation be related.  If patient is doing well she can follow-up as needed

## 2019-06-08 NOTE — Patient Instructions (Signed)
Looks decent Exercise when you can Wear good shoes  See me when you need me

## 2019-06-08 NOTE — Telephone Encounter (Signed)
-----   Message from [name], MD sent at 06/04/2019  6:51 PM EST ----- Normal labs except for elevated LDL 118, she is on high dose or rosuvastatin, and she doesn't have known CAD but severe resistant hypertension. Her LDL 118, is there anything else I can do? I am worried that Zetia would be too expensive. Thank you, [place] Siren

## 2019-06-08 NOTE — Progress Notes (Signed)
[place] Phone: [phone] Subjective:   Erica [name], am serving as a scribe for Dr. Hulan Saas. This visit occurred during the SARS-CoV-2 public health emergency.  Safety protocols were in place, including screening questions prior to the visit, additional usage of staff PPE, and extensive cleaning of exam room while observing appropriate contact time as indicated for disinfecting solutions.   I'm seeing this patient by the request  of:  [name], MD  CC: Bilateral ankle pain  [id number]   05/11/2019 On ultrasound appears to have a very small nondisplaced lateral malleolus fracture.  Aircast given, discussed icing regimen and home exercise, discussed which activities to do which wants to avoid.  Patient will continue with range of motion.  Is in orthotics that I think will be beneficial.  Follow-up with me again in 4 weeks  Social determinants of health including financial and transportation constraints and updated comorbidities that are contributing to patient not being able to workout on a regular basis.  Update 06/08/2019 Erica [name] is a 44 y.o. female coming in with complaint of right ankle pain. Patient states that she is not having as much pain as last visit. Discontinued brace as it increased her pain.  Patient was seen overall about 90% better.  Not having as much significant pain is been able to increase activity slowly.  Notices if she does not wear good shoes does have some increasing pain.      Past Medical History:  Diagnosis Date  . Allergy   . Anxiety   . Arthritis   . Asthma   . Blood transfusion without reported diagnosis   . Breast cancer ([place]) 2010   a. locally advanced left breast carcinoma diagnosed in 2010 and treated with neoadjuvant chemotherapy with docetaxel and cyclophosphamide as well as paclitaxel. This was followed by radiation therapy which was completed in  2011.  Marland Kitchen CHF (congestive heart failure) ([place])   . Chronic kidney disease   . Dyspnea on exertion    a. 01/2013 Lexi MV: EF 54%, [name] ischemia/infarct;  b. 05/2013 Echo: EF 60-65%, [name] rwma, Gr 2 DD;  b.   . Fatty liver   . Gastroparesis   . GERD (gastroesophageal reflux disease)   . Hypertension   . Impaired glucose tolerance 04/13/2011  . Lymphadenitis, chronic    restricted LEFT extremity  . 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 OSA (obstructive sleep apnea) 05/04/2014   AHI 31/hr now on CPAP at 12cm H2O  . Oxygen deficiency    Uses 02 in CPAP  . Personal history of chemotherapy 2010  . Personal history of radiation therapy 2010  . Sleep apnea    Past Surgical History:  Procedure Laterality Date  . BRAIN SURGERY     [place]  . BREAST LUMPECTOMY Left 09/2008  . CARPAL TUNNEL RELEASE Left 2011  . LYMPH NODE DISSECTION Left 2010   breast; 2 wks after breast lumpectomy  . RIGHT/LEFT HEART CATH AND CORONARY ANGIOGRAPHY N/A 05/04/2019   Procedure: RIGHT/LEFT HEART CATH AND CORONARY ANGIOGRAPHY;  Surgeon: Jolaine Artist, MD;  Location: [facility];  Service: Cardiovascular;  Laterality: N/A;  Renal injection preformed   . TENDON RELEASE Right 2012   "hand"  . TUBAL LIGATION  05/11/2011   Procedure: POST PARTUM TUBAL LIGATION;  Surgeon: Emeterio Reeve, MD;  Location: [facility];  Service: Gynecology;  Laterality: Bilateral;  Induced for HTN  Social History   Socioeconomic History  . Marital status: Single    Spouse name: Not on file  . Number of children: 3  . Years of education: Not on file  . Highest education level: Not on file  Occupational History    Employer: UNEMPLOYED  Tobacco Use  . Smoking status: Never Smoker  . Smokeless tobacco: Never Used  Substance and Sexual Activity  . Alcohol use: Yes    Alcohol/week: 0.0 standard drinks    Comment: occasionally/socially  . Drug use: [name]  . Sexual activity: Yes    Birth control/protection: Surgical  Other Topics Concern   . Not on file  Social History Narrative   She lives with four children.   She is currently not working (disbaility pending).      Social Determinants of Health   Financial Resource Strain:   . Difficulty of Paying Living Expenses: Not on file  Food Insecurity: Food Insecurity Present  . Worried About Charity fundraiser in the Last Year: Often true  . Ran Out of Food in the Last Year: Often true  Transportation Needs:   . Lack of Transportation (Medical): Not on file  . Lack of Transportation (Non-Medical): Not on file  Physical Activity:   . Days of Exercise per Week: Not on file  . Minutes of Exercise per Session: Not on file  Stress:   . Feeling of Stress : Not on file  Social Connections:   . Frequency of Communication with Friends and Family: Not on file  . Frequency of Social Gatherings with Friends and Family: Not on file  . Attends Religious Services: Not on file  . Active Member of Clubs or Organizations: Not on file  . Attends Archivist Meetings: Not on file  . Marital Status: Not on file   Allergies  Allergen Reactions  . Aspirin Shortness Of Breath and Palpitations    Pt can take ibuprofen without reaction  . Doxycycline Anaphylaxis  . Kiwi Extract Itching and Swelling    Lip swelling  . Metoprolol Hives and Itching  . Penicillins Shortness Of Breath and Palpitations    Has patient had a PCN reaction causing immediate rash, facial/tongue/throat swelling, SOB or lightheadedness with hypotension: Yes Has patient had a PCN reaction causing severe rash involving mucus membranes or skin necrosis: Yes Has patient had a PCN reaction that required hospitalization Yes Has patient had a PCN reaction occurring within the last 10 years: [name] If all of the above answers are "[name]", then may proceed with Cephalosporin use.  . Strawberry Extract Hives  . Sulfamethoxazole-Trimethoprim Anaphylaxis    Facial, throat and tongue swelling with difficulty swallowing.    Family History  Problem Relation Age of Onset  . Other Mother 36       breast calcifications treated with surgery, breast cancer pill, and radiation  . Fibroids Sister 38       s/p TAH-BSO  . Diabetes Maternal Grandmother   . Heart disease Maternal Grandmother   . Hypertension Maternal Grandmother   . CAD Maternal Grandmother   . Breast cancer Other        maternal great aunt (MGM's sister)  . Breast cancer Other        paternal great aunt dx middle ages; s/p mastectomy  . Cancer Neg Hx   . Alcohol abuse Neg Hx   . Early death Neg Hx   . Hyperlipidemia Neg Hx   . Kidney disease Neg Hx   .  Stroke Neg Hx   . Colon cancer Neg Hx   . Esophageal cancer Neg Hx   . Stomach cancer Neg Hx   . Rectal cancer Neg Hx     Current Outpatient Medications (Endocrine & Metabolic):  .  predniSONE (DELTASONE) 10 MG tablet, Take  4 each am x 2 days,   2 each am x 2 days,  1 each am x 2 days and stop  Current Outpatient Medications (Cardiovascular):  .  acetaZOLAMIDE (DIAMOX) 250 MG tablet, TAKE 4 TABLETS BY MOUTH 3 TIMES DAILY. (Patient taking differently: Take 2,000 mg by mouth 3 (three) times daily. ) .  cloNIDine (CATAPRES - DOSED IN MG/24 HR) 0.3 mg/24hr patch, PLACE 1 PATCH ONTO SKIN ONCE WEEKLY (Patient taking differently: Place 0.3 mg onto the skin once a week. Sunday) .  diltiazem (CARDIZEM LA) 420 MG 24 hr tablet, Take 1 tablet (420 mg total) by mouth daily. Marland Kitchen  doxazosin (CARDURA) 8 MG tablet, Take 1 tablet (8 mg total) by mouth daily. Marland Kitchen  EPINEPHRINE 0.3 mg/0.3 mL IJ SOAJ injection, INJECT 0.3 MLS INTO THE MUSCLE ONCE AS NEEDED FOR ANAPHYLAXIS (Patient taking differently: Inject 0.3 mg into the muscle as needed for anaphylaxis. ) .  ezetimibe (ZETIA) 10 MG tablet, Take 1 tablet (10 mg total) by mouth daily. .  hydrALAZINE (APRESOLINE) 100 MG tablet, TAKE 1 TABLET (100 MG TOTAL) BY MOUTH 3 (THREE) TIMES DAILY. .  Nebivolol HCl 20 MG TABS, Take 2 tablets (40 mg total) by mouth daily. Marland Kitchen   NITROSTAT 0.4 MG SL tablet, PLACE 1 TABLET UNDER TONGUE EVERY 5 MINUTES AS NEEDED FOR CHEST PAIN, MAX OF 3 TABLETS THEN CALL 911 IF PAIN PERSISTS (Patient taking differently: Place 0.4 mg under the tongue every 5 (five) minutes as needed for chest pain. ) .  ranolazine (RANEXA) 500 MG 12 hr tablet, TAKE 1 TABLET BY MOUTH TWICE A DAY (Patient taking differently: Take 500 mg by mouth 2 (two) times daily. ) .  rosuvastatin (CRESTOR) 40 MG tablet, Take 1 tablet (40 mg total) by mouth daily. Marland Kitchen  spironolactone (ALDACTONE) 100 MG tablet, TAKE 1 TABLET BY MOUTH EVERY DAY (Patient taking differently: Take 100 mg by mouth daily. ) .  torsemide (DEMADEX) 20 MG tablet, Take 2 tablets (40 mg total) by mouth daily. .  valsartan (DIOVAN) 320 MG tablet, TAKE 1 TABLET BY MOUTH EVERY DAY (Patient taking differently: Take 320 mg by mouth daily. )  Current Outpatient Medications (Respiratory):  .  albuterol (PROVENTIL) (2.5 MG/3ML) 0.083% nebulizer solution, USE 1 VIAL VIA NEBULIZER EVERY 4 HOURS AND AS NEEDED FOR WHEEZING OR SHORTNESS OF BREATH (Patient taking differently: Take 2.5 mg by nebulization every 4 (four) hours as needed for wheezing. ) .  albuterol (VENTOLIN HFA) 108 (90 Base) MCG/ACT inhaler, INHALE 2 PUFFS BY MOUTH EVERY 4 HOURS AS NEEDED FOR WHEEZE OR FOR SHORTNESS OF BREATH (Patient taking differently: Inhale 1-2 puffs into the lungs every 4 (four) hours as needed for wheezing or shortness of breath. ) .  dextromethorphan-guaiFENesin (MUCINEX DM) 30-600 MG 12hr tablet, Take 2 tablets by mouth 2 (two) times daily.  .  montelukast (SINGULAIR) 10 MG tablet, Take 1 tablet (10 mg total) by mouth at bedtime. Marland Kitchen  oxymetazoline (MUCINEX SINUS-MAX FULL FORCE) 0.05 % nasal spray, Place 1 spray into both nostrils 2 (two) times daily.  .  promethazine (PHENERGAN) 25 MG suppository, Place 25 mg rectally every 6 (six) hours as needed for nausea or vomiting. .  promethazine (  PHENERGAN) 25 MG tablet, Take 1 tablet (25 mg  total) by mouth 3 (three) times daily as needed for nausea or vomiting. .  SYMBICORT 160-4.5 MCG/ACT inhaler, INHALE 2 PUFFS INTO LUNGS 2 TIMES DAILY AS DIRECTED  Current Outpatient Medications (Analgesics):  .  acetaminophen-codeine (TYLENOL #4) 300-60 MG tablet, TAKE 1 TABLET BY MOUTH EVERY 4 (FOUR) HOURS AS NEEDED FOR MODERATE PAIN  Current Outpatient Medications (Hematological):  .  clopidogrel (PLAVIX) 75 MG tablet, Take 1 tablet (75 mg total) by mouth daily.  Current Outpatient Medications (Other):  .  amitriptyline (ELAVIL) 100 MG tablet, TAKE 1 TABLET BY MOUTH EVERYDAY AT BEDTIME (Patient taking differently: Take 100 mg by mouth at bedtime. ) .  Biotin 5000 MCG TABS, Take 5,000 mg by mouth 2 (two) times daily.  .  Cholecalciferol 1.25 MG (50000 UT) capsule, Take 1 capsule (50,000 Units total) by mouth once a week. .  clotrimazole (MYCELEX) 10 MG troche, Take 1 tablet (10 mg total) by mouth 5 (five) times daily. (Patient taking differently: Take 10 mg by mouth daily as needed ([place]). ) .  dexlansoprazole (DEXILANT) 60 MG capsule, Take 1 capsule (60 mg total) by mouth daily. .  Diclofenac Sodium 2 % SOLN, Place 2 g onto the skin 2 (two) times daily. .  DULoxetine (CYMBALTA) 30 MG capsule, Take 1 capsule (30 mg total) by mouth daily. With 60mg  for total of 90 mg .  DULoxetine (CYMBALTA) 60 MG capsule, Take 1 capsule (60 mg total) by mouth daily. For total of 90 mg .  famotidine (PEPCID) 20 MG tablet, Take 1-2 tablets (20-40 mg total) by mouth at bedtime. (Patient taking differently: Take 40 mg by mouth at bedtime. ) .  lidocaine-prilocaine (EMLA) cream, Apply 1 application topically every 28 (twenty-eight) days. Apply 1 application to skin ever 28 days .  metoCLOPramide (REGLAN) 10 MG tablet, Take 1 tablet (10 mg total) by mouth 4 (four) times daily. .  Potassium Chloride ER 20 MEQ TBCR, TAKE 1 TABLET BY MOUTH TWICE A DAY (Patient taking differently: Take 20 mEq by mouth daily. ) .   primidone (MYSOLINE) 50 MG tablet, TAKE ONE-HALF TABLET (25 MG TOTAL) BY MOUTH AT BEDTIME. (Patient taking differently: Take 25 mg by mouth at bedtime. ) .  Respiratory Therapy Supplies (FLUTTER) DEVI, Use as directed .  Spacer/Aero-Holding Chambers (AEROCHAMBER MV) inhaler, Use as instructed   Reviewed prior external information including notes and imaging from  primary care provider As well as notes that were available from care everywhere and other healthcare systems.  Past medical history, social, surgical and family history all reviewed in electronic medical record.  [name] pertanent information unless stated regarding to the chief complaint.   Review of Systems:  [name] headache, visual changes, nausea, vomiting, diarrhea, constipation, dizziness, abdominal pain, skin rash, fevers, chills, night sweats, weight loss, swollen lymph nodes, , joint swelling, chest pain, shortness of breath, mood changes. POSITIVE muscle aches, body aches  Objective  Blood pressure (!) 142/98, pulse (!) 103, height 5\' 6"  (1.676 m), weight 208 lb (94.3 kg), SpO2 97 %.   General: [name] apparent distress alert and oriented x3 mood and affect normal, dressed appropriately.  HEENT: Pupils equal, extraocular movements intact  Respiratory: Patient's speak in full sentences and does not appear short of breath  Cardiovascular: [name] lower extremity edema, non tender, [name] erythema  Skin: Warm dry intact with [name] signs of infection or rash on extremities or on axial skeleton.  Abdomen: Soft nontender  Neuro:  Cranial nerves II through XII are intact, neurovascularly intact in all extremities with 2+ DTRs and 2+ pulses.  Lymph: [name] lymphadenopathy of posterior or anterior cervical chain or axillae bilaterally.  Gait normal with good balance and coordination.  MSK: Ankle still have pain out of proportion to the amount of palpation.  Patient though does not have any significant swelling.  Patient does have pain even to light  palpation.     Impression and Recommendations:     This case required medical decision making of moderate complexity. The above documentation has been reviewed and is accurate and complete Lyndal Pulley, DO       Note: This dictation was prepared with Dragon dictation along with smaller phrase technology. Any transcriptional errors that result from this process are unintentional.

## 2019-06-08 NOTE — Telephone Encounter (Signed)
Erica [name] [facility]  06/07/2019 7:21 AM EST    Zetia is a Tier 2 on her plan which should be ok in pricing - if it is too expensive, she can have Zetia filled at [facility] where she can use a GoodRx coupon that makes 1 month cost $9 or 3 months cost $21         Spoke with the pt and informed her of lab results and recommendations per Dr. Meda Coffee, and our Pharmacist Fuller Canada, for her to add Zetia 10 mg po daily to her regimen.  Informed the pt that per our Pharmacist Jinny Blossom, she checked the pricing of Zetia with her current insurance plan, and the pricing should be ok for her.  Informed the pt that if the pricing at [facility] for this med is too expensive, then she should call us back, for Jinny Blossom said that we can send her Zetia to a [facility] where she can use GoodRX coupon, and that would make a month cost $9 and $21 for 90 day supply. Confirmed the pharmacy of choice with the pt.  Pt verbalized she will call back if they quote her a costly amount for this med at [facility].  Advised the pt to follow-up as planned with Dr. Meda Coffee on 3/17.  Pt verbalized understanding and agrees with this plan.

## 2019-06-09 ENCOUNTER — Telehealth: Payer: Self-pay | Admitting: *Deleted

## 2019-06-09 NOTE — Telephone Encounter (Signed)
Informed patient of her overnight pulse oximetry results which was nocturnal hypoxemia and verbalized understanding was indicated. Patient understands that Dr Radford Pax is awaiting her Bipap titration results to come in before moving forward.

## 2019-06-09 NOTE — Telephone Encounter (Signed)
-----   Message from [name], MD sent at 06/07/2019  8:39 PM EST ----- Nocturnal hypoxemia - patient got BIPAP titration last night - await results

## 2019-06-10 NOTE — Procedures (Signed)
    Patient Name: Erica [name], Erica [name] Date: 06/06/2019 Gender: Female D.O.B: 20-May-1975 Age (years): 68 Referring Provider: Fransico Him MD, ABSM Height (inches): 66 Interpreting Physician: Fransico Him MD, ABSM Weight (lbs): 209 RPSGT: Lanae Boast BMI: 34 MRN: [id number] Neck Size: 15.50  CLINICAL INFORMATION The patient is referred for a BiPAP titration to treat sleep apnea.  SLEEP STUDY TECHNIQUE As per the AASM Manual for the Scoring of Sleep and Associated Events v2.3 (April 2016) with a hypopnea requiring 4% desaturations.  The channels recorded and monitored were frontal, central and occipital EEG, electrooculogram (EOG), submentalis EMG (chin), nasal and oral airflow, thoracic and abdominal wall motion, anterior tibialis EMG, snore microphone, electrocardiogram, and pulse oximetry. Bilevel positive airway pressure (BPAP) was initiated at the beginning of the study and titrated to treat sleep-disordered breathing.  MEDICATIONS Medications self-administered by patient taken the night of the study : N/A  RESPIRATORY PARAMETERS Optimal IPAP Pressure (cm): 17  AHI at Optimal Pressure (/hr) 0.0 Optimal EPAP Pressure (cm):13  Overall Minimal O2 (%):87.0  Minimal O2 at Optimal Pressure (%): 95.0  SLEEP ARCHITECTURE Start Time:10:52:23 PM  Stop Time:4:53:47 AM  Total Time (min):361.4  Total Sleep Time (min):340 Sleep Latency (min):5.4  Sleep Efficiency (%):94.1%  REM Latency (min):103.5  WASO (min):16.0 Stage N1 (%): 12.5%  Stage N2 (%): 40.0%  Stage N3 (%): 23.7%  Stage R (%): 23.8 Supine (%):45.64 Arousal Index (/hr):23.8   CARDIAC DATA The 2 lead EKG demonstrated sinus rhythm. The mean heart rate was 82.2 beats per minute. Other EKG findings include: None.  LEG MOVEMENT DATA The total Periodic Limb Movements of Sleep (PLMS) were 0. The PLMS index was 0.0. A PLMS index of <15 is considered normal in adults.  IMPRESSIONS - An optimal PAP pressure was  selected for this patient ( 17 / cm of water) - Central sleep apnea was not noted during this titration (CAI = 0.4/h). - Mild oxygen desaturations were observed during this titration (min O2 = 87.0%). - The patient snored with loud snoring volume. - No cardiac abnormalities were observed during this study. - Clinically significant periodic limb movements were not noted during this study. Arousals associated with PLMs were rare.  DIAGNOSIS - Obstructive Sleep Apnea (327.23 [G47.33 ICD-10])  RECOMMENDATIONS - Trial of BiPAP therapy on 17/13 cm H2O with a Small size Philips Respironics Full Face Mask Dreamwear mask and heated humidification. - Avoid alcohol, sedatives and other CNS depressants that may worsen sleep apnea and disrupt normal sleep architecture. - Sleep hygiene should be reviewed to assess factors that may improve sleep quality. - Weight management and regular exercise should be initiated or continued. - Return to Sleep Center for re-evaluation after 8 weeks of therapy  [Electronically signed] 06/10/2019 09:14 PM  Fransico Him MD, ABSM Diplomate, American Board of Sleep Medicine

## 2019-06-14 ENCOUNTER — Telehealth: Payer: Self-pay | Admitting: *Deleted

## 2019-06-14 DIAGNOSIS — G4733 Obstructive sleep apnea (adult) (pediatric): Secondary | ICD-10-CM

## 2019-06-14 NOTE — Telephone Encounter (Signed)
-----   Message from [name], MD sent at 06/10/2019  9:18 PM EST ----- Please let patient know that they had a successful PAP titration and let DME know that orders are in EPIC.  Please set up 10 week OV with me.

## 2019-06-14 NOTE — Telephone Encounter (Signed)
-----   Message from [name], MD sent at 06/14/2019  4:16 PM EDT ----- Regarding: RE: OXYGEN YES or NO Please get overnight pulse ox on BiPAP to determine if supplemental O2 needed  [name] ----- Message ----- From: Freada Bergeron, CMA Sent: 06/14/2019   4:01 PM EDT To: [name], MD Subject: OXYGEN YES or NO                                Her overnight pulse oximetry results which was nocturnal hypoxemia  Patient understands that Dr Radford Pax is awaiting her Bipap titration results to come in before moving forward.  Mild oxygen desaturations were observed during this titration (min O2 = 87.0%).  Patient wants to know if you are going to order her oxygen now because she feels short of breath.  Please advise

## 2019-06-14 NOTE — Telephone Encounter (Addendum)
Informed patient of titration results and verbalized understanding was indicated. Patient understands her sleep study showed they had a successful PAP titration and let DME know that orders are in EPIC. Please set up 10 week OV with me.   Upon patient request DME selection is [facility]. Patient understands she will be contacted by [place] [facility] to set up her cpap. Patient understands to call if [place] does not contact her with new setup in a timely manner. Patient understands they will be called once confirmation has been received from [facility] that they have received their new machine to schedule 10 week follow up appointment.  Order sent to [facility] via community message

## 2019-06-15 ENCOUNTER — Telehealth: Payer: Self-pay | Admitting: Cardiology

## 2019-06-15 NOTE — Addendum Note (Signed)
Addended by: Freada Bergeron on: 06/15/2019 03:29 PM   Modules accepted: Orders

## 2019-06-15 NOTE — Telephone Encounter (Signed)
Amber from [facility] [employer] is calling to get the settings for this patient's cpap machine faxed over. Please fax to [phone] ATTN: Luetta Nutting

## 2019-06-16 ENCOUNTER — Ambulatory Visit (INDEPENDENT_AMBULATORY_CARE_PROVIDER_SITE_OTHER): Payer: Medicare Other | Admitting: Cardiology

## 2019-06-16 ENCOUNTER — Other Ambulatory Visit: Payer: Self-pay

## 2019-06-16 ENCOUNTER — Encounter: Payer: Self-pay | Admitting: Cardiology

## 2019-06-16 VITALS — BP 170/88 | HR 108 | Ht 66.0 in | Wt 205.8 lb

## 2019-06-16 DIAGNOSIS — I5032 Chronic diastolic (congestive) heart failure: Secondary | ICD-10-CM | POA: Diagnosis not present

## 2019-06-16 DIAGNOSIS — I110 Hypertensive heart disease with heart failure: Secondary | ICD-10-CM | POA: Diagnosis not present

## 2019-06-16 DIAGNOSIS — I5042 Chronic combined systolic (congestive) and diastolic (congestive) heart failure: Secondary | ICD-10-CM | POA: Diagnosis not present

## 2019-06-16 DIAGNOSIS — I10 Essential (primary) hypertension: Secondary | ICD-10-CM | POA: Diagnosis not present

## 2019-06-16 DIAGNOSIS — E785 Hyperlipidemia, unspecified: Secondary | ICD-10-CM

## 2019-06-16 DIAGNOSIS — I5043 Acute on chronic combined systolic (congestive) and diastolic (congestive) heart failure: Secondary | ICD-10-CM

## 2019-06-16 DIAGNOSIS — G4733 Obstructive sleep apnea (adult) (pediatric): Secondary | ICD-10-CM

## 2019-06-16 NOTE — Progress Notes (Signed)
Cardiology Office Note:    Date:  06/16/2019   ID:  Erica [name], DOB 02/06/1976, MRN [id number]  PCP:  [name], MD  Cardiologist:  Ena Dawley, MD  Referring MD: [name], MD   Chief complaint: Shortness of breath and fatigue.  History of Present Illness:    Erica [name] is a 44 y.o. female with a past medical history significant for breast cancer 2010 treated with chemo and radiation-now on immunotherapy, asthma, chronic cough, hypertension, GERD, OSA on CPAP and obesity.  She also has a history of recurrent syncope with cardiac monitor showing only sinus rhythm.  This was felt to be due to narcolepsy and was  treated with CPAP for sleep apnea.  She does not tolerate her CPAP well, she has tried 3 different mask and still feels like she is suffocating and does not use it on a regular basis.  She was supposed to be tested for BiPAP however this was right when Covid hit and her appointments have been rescheduled several times.  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  She has had significant resistant hypertension on combination of multiple drugs, she underwent testing including renal and renal artery ultrasound that showed normal size kidney and parenchyma in 2018 with less than 60% stenosis in the right renal artery and no stenosis in left renal artery, repeated scanning 2020 showed less than 60% stenosis bilaterally.  The patient has been treated with combination of 6 medications including high-dose of spironolactone that will cover hyperaldosteronic state however remains severely hypertensive and also tachycardic despite nebivolol 40 mg daily.  She now states that she is getting progressively more short of breath she is unable to walk from the parking lot to our clinic without stopping, she is unable to lay flat, she measured her pulse ox at night and it was as low as 75%.  She is currently not  using CPAP at night.  She has no lower extremity edema.  She was seen by our comprehensive hypertensive clinic and follows with their 12-week program, she was also evaluated in the sleep lab and diagnosed with sleep apnea, she is awaiting for her CPAP machine.  She is feeling slightly better, no recent chest pain but continues to feel very fatigued and dyspneic on exertion.  No lower extremity edema.  Past Medical History:  Diagnosis Date  . Allergy   . Anxiety   . Arthritis   . Asthma   . Blood transfusion without reported diagnosis   . Breast cancer ([place]) 2010   a. locally advanced left breast carcinoma diagnosed in 2010 and treated with neoadjuvant chemotherapy with docetaxel and cyclophosphamide as well as paclitaxel. This was followed by radiation therapy which was completed in 2011.  Marland Kitchen CHF (congestive heart failure) ([place])   . Chronic kidney disease   . Dyspnea on exertion    a. 01/2013 Lexi MV: EF 54%, no ischemia/infarct;  b. 05/2013 Echo: EF 60-65%, no rwma, Gr 2 DD;  b.   . Fatty liver   . Gastroparesis   . GERD (gastroesophageal reflux disease)   . Hypertension   . Impaired glucose tolerance 04/13/2011  . Lymphadenitis, chronic    restricted LEFT extremity  . 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 OSA (obstructive sleep apnea) 05/04/2014   AHI 31/hr now on CPAP at 12cm H2O  . Oxygen deficiency    Uses 02 in CPAP  . Personal history  of chemotherapy 2010  . Personal history of radiation therapy 2010  . Sleep apnea    Past Surgical History:  Procedure Laterality Date  . BRAIN SURGERY     Shunt  . BREAST LUMPECTOMY Left 09/2008  . CARPAL TUNNEL RELEASE Left 2011  . LYMPH NODE DISSECTION Left 2010   breast; 2 wks after breast lumpectomy  . RIGHT/LEFT HEART CATH AND CORONARY ANGIOGRAPHY N/A 05/04/2019   Procedure: RIGHT/LEFT HEART CATH AND CORONARY ANGIOGRAPHY;  Surgeon: Jolaine Artist, MD;  Location: [facility];  Service: Cardiovascular;  Laterality: N/A;   Renal injection preformed   . TENDON RELEASE Right 2012   "hand"  . TUBAL LIGATION  05/11/2011   Procedure: POST PARTUM TUBAL LIGATION;  Surgeon: Emeterio Reeve, MD;  Location: [facility];  Service: Gynecology;  Laterality: Bilateral;  Induced for HTN   Current Medications: Current Meds  Medication Sig  . acetaminophen-codeine (TYLENOL #4) 300-60 MG tablet TAKE 1 TABLET BY MOUTH EVERY 4 (FOUR) HOURS AS NEEDED FOR MODERATE PAIN  . acetaZOLAMIDE (DIAMOX) 250 MG tablet TAKE 4 TABLETS BY MOUTH 3 TIMES DAILY.  Marland Kitchen albuterol (PROVENTIL) (2.5 MG/3ML) 0.083% nebulizer solution USE 1 VIAL VIA NEBULIZER EVERY 4 HOURS AND AS NEEDED FOR WHEEZING OR SHORTNESS OF BREATH  . albuterol (VENTOLIN HFA) 108 (90 Base) MCG/ACT inhaler INHALE 2 PUFFS BY MOUTH EVERY 4 HOURS AS NEEDED FOR WHEEZE OR FOR SHORTNESS OF BREATH  . amitriptyline (ELAVIL) 100 MG tablet TAKE 1 TABLET BY MOUTH EVERYDAY AT BEDTIME  . Biotin 5000 MCG TABS Take 5,000 mg by mouth 2 (two) times daily.   . Cholecalciferol 1.25 MG (50000 UT) capsule Take 1 capsule (50,000 Units total) by mouth once a week.  . cloNIDine (CATAPRES - DOSED IN MG/24 HR) 0.3 mg/24hr patch PLACE 1 PATCH ONTO SKIN ONCE WEEKLY  . clopidogrel (PLAVIX) 75 MG tablet Take 1 tablet (75 mg total) by mouth daily.  . clotrimazole (MYCELEX) 10 MG troche Take 1 tablet (10 mg total) by mouth 5 (five) times daily.  Marland Kitchen dexlansoprazole (DEXILANT) 60 MG capsule Take 1 capsule (60 mg total) by mouth daily.  Marland Kitchen dextromethorphan-guaiFENesin (MUCINEX DM) 30-600 MG 12hr tablet Take 2 tablets by mouth 2 (two) times daily.   . Diclofenac Sodium 2 % SOLN Place 2 g onto the skin 2 (two) times daily.  Marland Kitchen diltiazem (CARDIZEM LA) 420 MG 24 hr tablet Take 1 tablet (420 mg total) by mouth daily.  Marland Kitchen doxazosin (CARDURA) 8 MG tablet Take 1 tablet (8 mg total) by mouth daily.  . DULoxetine (CYMBALTA) 30 MG capsule Take 1 capsule (30 mg total) by mouth daily. With 60mg  for total of 90 mg  . DULoxetine (CYMBALTA) 60 MG  capsule Take 1 capsule (60 mg total) by mouth daily. For total of 90 mg  . EPINEPHRINE 0.3 mg/0.3 mL IJ SOAJ injection INJECT 0.3 MLS INTO THE MUSCLE ONCE AS NEEDED FOR ANAPHYLAXIS  . ezetimibe (ZETIA) 10 MG tablet Take 1 tablet (10 mg total) by mouth daily.  . famotidine (PEPCID) 20 MG tablet Take 1-2 tablets (20-40 mg total) by mouth at bedtime.  . hydrALAZINE (APRESOLINE) 100 MG tablet TAKE 1 TABLET (100 MG TOTAL) BY MOUTH 3 (THREE) TIMES DAILY.  Marland Kitchen lidocaine-prilocaine (EMLA) cream Apply 1 application topically every 28 (twenty-eight) days. Apply 1 application to skin ever 28 days  . metoCLOPramide (REGLAN) 10 MG tablet Take 1 tablet (10 mg total) by mouth 4 (four) times daily.  . montelukast (SINGULAIR) 10 MG tablet  Take 1 tablet (10 mg total) by mouth at bedtime.  . Nebivolol HCl 20 MG TABS Take 2 tablets (40 mg total) by mouth daily.  Marland Kitchen NITROSTAT 0.4 MG SL tablet PLACE 1 TABLET UNDER TONGUE EVERY 5 MINUTES AS NEEDED FOR CHEST PAIN, MAX OF 3 TABLETS THEN CALL 911 IF PAIN PERSISTS  . oxymetazoline (MUCINEX SINUS-MAX FULL FORCE) 0.05 % nasal spray Place 1 spray into both nostrils 2 (two) times daily.   . Potassium Chloride ER 20 MEQ TBCR TAKE 1 TABLET BY MOUTH TWICE A DAY  . predniSONE (DELTASONE) 10 MG tablet Take  4 each am x 2 days,   2 each am x 2 days,  1 each am x 2 days and stop  . primidone (MYSOLINE) 50 MG tablet TAKE ONE-HALF TABLET (25 MG TOTAL) BY MOUTH AT BEDTIME.  . promethazine (PHENERGAN) 25 MG suppository Place 25 mg rectally every 6 (six) hours as needed for nausea or vomiting.  . promethazine (PHENERGAN) 25 MG tablet Take 1 tablet (25 mg total) by mouth 3 (three) times daily as needed for nausea or vomiting.  . ranolazine (RANEXA) 500 MG 12 hr tablet TAKE 1 TABLET BY MOUTH TWICE A DAY  . Respiratory Therapy Supplies (FLUTTER) DEVI Use as directed  . rosuvastatin (CRESTOR) 40 MG tablet Take 1 tablet (40 mg total) by mouth daily.  Marland Kitchen Spacer/Aero-Holding Chambers (AEROCHAMBER MV)  inhaler Use as instructed  . spironolactone (ALDACTONE) 100 MG tablet TAKE 1 TABLET BY MOUTH EVERY DAY  . SYMBICORT 160-4.5 MCG/ACT inhaler INHALE 2 PUFFS INTO LUNGS 2 TIMES DAILY AS DIRECTED  . torsemide (DEMADEX) 20 MG tablet Take 2 tablets (40 mg total) by mouth daily.  . valsartan (DIOVAN) 320 MG tablet TAKE 1 TABLET BY MOUTH EVERY DAY    Allergies:   Aspirin, Doxycycline, Kiwi extract, Metoprolol, Penicillins, Strawberry extract, and Sulfamethoxazole-trimethoprim   Social History   Socioeconomic History  . Marital status: Single    Spouse name: Not on file  . Number of children: 3  . Years of education: Not on file  . Highest education level: Not on file  Occupational History    Employer: UNEMPLOYED  Tobacco Use  . Smoking status: Never Smoker  . Smokeless tobacco: Never Used  Substance and Sexual Activity  . Alcohol use: Yes    Alcohol/week: 0.0 standard drinks    Comment: occasionally/socially  . Drug use: No  . Sexual activity: Yes    Birth control/protection: Surgical  Other Topics Concern  . Not on file  Social History Narrative   She lives with four children.   She is currently not working (disbaility pending).      Social Determinants of Health   Financial Resource Strain:   . Difficulty of Paying Living Expenses:   Food Insecurity: Food Insecurity Present  . Worried About Charity fundraiser in the Last Year: Often true  . Ran Out of Food in the Last Year: Often true  Transportation Needs:   . Lack of Transportation (Medical):   Marland Kitchen Lack of Transportation (Non-Medical):   Physical Activity:   . Days of Exercise per Week:   . Minutes of Exercise per Session:   Stress:   . Feeling of Stress :   Social Connections:   . Frequency of Communication with Friends and Family:   . Frequency of Social Gatherings with Friends and Family:   . Attends Religious Services:   . Active Member of Clubs or Organizations:   . Attends Club or  Organization Meetings:   Marland Kitchen  Marital Status:     Family History: The patient's family history includes Breast cancer in some other family members; CAD in her maternal grandmother; Diabetes in her maternal grandmother; Fibroids (age of onset: 63) in her sister; Heart disease in her maternal grandmother; Hypertension in her maternal grandmother; Other (age of onset: 75) in her mother. There is no history of Cancer, Alcohol abuse, Early death, Hyperlipidemia, Kidney disease, Stroke, Colon cancer, Esophageal cancer, Stomach cancer, or Rectal cancer.  ROS:   Please see the history of present illness.     All other systems reviewed and are negative.  EKGs/Labs/Other Studies Reviewed:    The following studies were reviewed today:  Nuclear stress test: 02/15/2013 Impression Exercise Capacity: Lexiscan with no exercise.  BP Response: Hypertensive blood pressure response.  Clinical Symptoms: Chest pain, headache.  ECG Impression: No significant ST segment change suggestive of ischemia.  Comparison with Prior Nuclear Study: No images to compare  Overall Impression: Normal stress nuclear study.  LV Ejection Fraction: 54%. LV Wall Motion: NL LV Function; NL Wall Motion  Loralie Champagne  02/12/2013  TTE 06/17/2013 Left ventricle: The cavity size was normal. There was mild concentric hypertrophy. Systolic function was normal. The estimated ejection fraction was in the range of 60% to 65%. Wall motion was normal; there were no regional wall motion abnormalities. Features are consistent with a pseudonormal left ventricular filling pattern, with concomitant abnormal relaxation and increased filling pressure (grade 2 diastolic dysfunction). Doppler parameters are consistent with high ventricular filling pressure. Impressions: - When compared to 2010, EF has improved.  TTE: 09/18/15 - Left ventricle: The cavity size was normal. Wall thickness was increased in a pattern of severe LVH. Systolic function was vigorous. The  estimated ejection fraction was in the range of 65% to 70%. Doppler parameters are consistent with abnormal left ventricular relaxation (grade 1 diastolic dysfunction).  TTE: 03/2018 - Left ventricle: The cavity size was normal. Wall thickness was   increased in a pattern of moderate LVH. Systolic function was   mildly reduced. The estimated ejection fraction was in the range   of 45% to 50%. Wall motion was normal; there were no regional   wall motion abnormalities. Features are consistent with a   pseudonormal left ventricular filling pattern, with concomitant   abnormal relaxation and increased filling pressure (grade 2   diastolic dysfunction). - Mitral valve: There was mild regurgitation. - Pulmonary arteries: Systolic pressure was mildly increased. PA   peak pressure: 33 mm Hg (S).  EKG:  EKG is not ordered today.   Recent Labs: 04/30/2019: BUN 9; Creatinine, Ser 1.26; NT-Pro BNP 346; Platelets 216; TSH 1.470 05/04/2019: Hemoglobin 12.6; Hemoglobin 12.9; Potassium 3.1; Potassium 3.3; Sodium 144; Sodium 143 06/04/2019: ALT 23   Recent Lipid Panel    Component Value Date/Time   CHOL 176 06/04/2019 0825   TRIG 86 06/04/2019 0825   HDL 42 06/04/2019 0825   CHOLHDL 4.2 06/04/2019 0825   CHOLHDL 4 10/01/2018 1115   VLDL 22.6 10/01/2018 1115   LDLCALC 118 (H) 06/04/2019 0825   LDLDIRECT 152.0 03/30/2018 1604    Physical Exam:    VS:  BP (!) 170/88   Pulse (!) 108   Ht 5\' 6"  (1.676 m)   Wt 205 lb 12.8 oz (93.4 kg)   LMP  (LMP Unknown)   SpO2 96%   BMI 33.22 kg/m     Wt Readings from Last 3 Encounters:  06/16/19 205 lb 12.8  oz (93.4 kg)  06/08/19 208 lb (94.3 kg)  06/06/19 209 lb (94.8 kg)    Physical Exam  Constitutional: She is oriented to person, place, and time. She appears well-developed and well-nourished. No distress.  Cental obesity  HENT:  Head: Normocephalic and atraumatic.  Neck: No JVD present.  Cardiovascular: Normal rate, regular rhythm, normal  heart sounds and intact distal pulses. Exam reveals no gallop and no friction rub.  No murmur heard. Pulmonary/Chest: Effort normal and breath sounds normal. No respiratory distress. She has no wheezes. She has no rales.  Abdominal: Soft. Bowel sounds are normal.  Musculoskeletal:        General: No deformity or edema. Normal range of motion.     Cervical back: Normal range of motion and neck supple.  Neurological: She is alert and oriented to person, place, and time.  Skin: Skin is warm and dry.  Psychiatric: She has a normal mood and affect. Her behavior is normal. Judgment and thought content normal.  Vitals reviewed.   ASSESSMENT:    1. Chronic diastolic CHF (congestive heart failure) ([place])   2. Essential hypertension   3. Hypertensive heart disease with chronic combined systolic and diastolic congestive heart failure (H[facility])    PLAN:    In order of problems listed above:  Hypertensive heart disease, severe resistant hypertension -Long history of difficult to control hypertension blood pressure - No stenosis on renal artery ultrasound, normal renal parenchyma 03/2019, kidney cyst -She is currently on Spironolactone 100 mg daily, clonidine 0.3 mg daily, Cardizem 420 mg daily, hydralazine 100 mg 3 times a day and valsartan 320 mg daily, Nebivolol 40 mg po daily. -She is now being followed in the hypertension clinic -She now has a new LV dysfunction most probably secondary to poorly controlled blood pressure. - renin/aldosterone ratio was normal.  Chronic combined systolic diastolic CHF, LVEF 45 to [id number], previously 60%, NYHA class III -Secondary to poorly controlled hypertension. -She is on valsartan and Bystolic, hydralazine, did not tolerate Imdur secondary to headaches (probably sec to underlying pseudotumor cerebri) -She underwent left and right cardiac cath on May 04, 2019 That showed 1. 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 LVEF 55-60% 3. Mildly elevated filling  pressures with mild pulmonary venous HTN 4. Widely patent bilateral renal arteries. No evidence of AAA 5. Severe systemic HTN  Plan for Medical therapy. Distal LAD lesion is likely high-grade but very distal.   CAD -The patient has mild nonobstructive distal disease, will continue medical management.  She is currently on high-dose of Crestor 40 mg daily that she tolerates well. -The patient is encouraged to start walking 5 days a week for minimum of 30 minutes.   OSA, with desaturation at night down to 53' -She has been retested in our sleep lab and is awaiting her new CPAP machine.  Medication Adjustments/Labs and Tests Ordered: Current medicines are reviewed at length with the patient today.  Concerns regarding medicines are outlined above. Labs and tests ordered and medication changes are outlined in the patient instructions below:  Patient Instructions  Medication Instructions:   Your physician recommends that you continue on your current medications as directed. Please refer to the Current Medication list given to you today.  *If you need a refill on your cardiac medications before your next appointment, please call your pharmacy*   Follow-Up: At [facility], you and your health needs are our priority.  As part of our continuing mission to provide you with exceptional heart care, we  have created designated Provider Care Teams.  These Care Teams include your primary Cardiologist (physician) and Advanced Practice Providers (APPs -  Physician Assistants and Nurse Practitioners) who all work together to provide you with the care you need, when you need it.  We recommend signing up for the patient portal called "MyChart".  Sign up information is provided on this After Visit Summary.  MyChart is used to connect with patients for Virtual Visits (Telemedicine).  Patients are able to view lab/test results, encounter notes, upcoming appointments, etc.  Non-urgent messages can be sent to your  provider as well.   To learn more about what you can do with MyChart, go to [email or web address].    Your next appointment:   4 month(s)  The format for your next appointment:   In Person  Provider:   Ena Dawley, MD        Signed, Ena Dawley, MD  06/16/2019 11:16 AM    [place]

## 2019-06-16 NOTE — Patient Instructions (Signed)
Medication Instructions:   Your physician recommends that you continue on your current medications as directed. Please refer to the Current Medication list given to you today.  *If you need a refill on your cardiac medications before your next appointment, please call your pharmacy*   Follow-Up: At [facility], you and your health needs are our priority.  As part of our continuing mission to provide you with exceptional heart care, we have created designated Provider Care Teams.  These Care Teams include your primary Cardiologist (physician) and Advanced Practice Providers (APPs -  Physician Assistants and Nurse Practitioners) who all work together to provide you with the care you need, when you need it.  We recommend signing up for the patient portal called "MyChart".  Sign up information is provided on this After Visit Summary.  MyChart is used to connect with patients for Virtual Visits (Telemedicine).  Patients are able to view lab/test results, encounter notes, upcoming appointments, etc.  Non-urgent messages can be sent to your provider as well.   To learn more about what you can do with MyChart, go to [email or web address].    Your next appointment:   4 month(s)  The format for your next appointment:   In Person  Provider:   Ena Dawley, MD

## 2019-06-21 ENCOUNTER — Other Ambulatory Visit: Payer: Self-pay | Admitting: Internal Medicine

## 2019-06-21 DIAGNOSIS — E876 Hypokalemia: Secondary | ICD-10-CM

## 2019-06-23 ENCOUNTER — Inpatient Hospital Stay: Payer: Medicare Other | Attending: Oncology

## 2019-06-23 ENCOUNTER — Other Ambulatory Visit: Payer: Self-pay

## 2019-06-23 VITALS — BP 148/88 | HR 88 | Temp 98.2°F | Resp 18

## 2019-06-23 DIAGNOSIS — C50412 Malignant neoplasm of upper-outer quadrant of left female breast: Secondary | ICD-10-CM

## 2019-06-23 DIAGNOSIS — Z79818 Long term (current) use of other agents affecting estrogen receptors and estrogen levels: Secondary | ICD-10-CM | POA: Diagnosis present

## 2019-06-23 MED ORDER — GOSERELIN ACETATE 3.6 MG ~~LOC~~ IMPL
3.6000 mg | DRUG_IMPLANT | SUBCUTANEOUS | Status: DC
  Administered 2019-06-23: 3.6 mg via SUBCUTANEOUS

## 2019-06-23 NOTE — Patient Instructions (Signed)

## 2019-06-30 ENCOUNTER — Telehealth: Payer: Self-pay | Admitting: *Deleted

## 2019-06-30 ENCOUNTER — Encounter: Payer: Medicare Other | Admitting: Adult Health

## 2019-06-30 NOTE — Telephone Encounter (Addendum)
Patient notified

## 2019-06-30 NOTE — Telephone Encounter (Signed)
   TELEPHONE CALL NOTE  This patient has been deemed a candidate for follow-up tele-health visit to limit community exposure during the Covid-19 pandemic. I spoke with the patient via phone to discuss instructions. This has been outlined on the patient's AVS (dotphrase: [email or web address]). The patient was advised to review the section on consent for treatment as well. The patient will receive a phone call 2-3 days prior to their E-Visit at which time consent will be verbally confirmed.   A Virtual Office Visit appointment type has been scheduled for patient with TT, with "VIDEO" or "TELEPHONE" in the appointment notes - patient prefers video type.  I have either confirmed the patient is active in MyChart or offered to send sign-up link to phone/email via Mychart icon beside patient's photo.Morene Crocker, [place] 06/30/2019 6:01 PM  .

## 2019-07-02 ENCOUNTER — Other Ambulatory Visit: Payer: Self-pay | Admitting: Neurology

## 2019-07-05 ENCOUNTER — Ambulatory Visit: Payer: Medicaid - Out of State

## 2019-07-05 NOTE — Telephone Encounter (Signed)
30-day supply of medication sent, no refills, as patient is due for 1 year visit.

## 2019-07-06 ENCOUNTER — Ambulatory Visit (INDEPENDENT_AMBULATORY_CARE_PROVIDER_SITE_OTHER): Payer: Medicare Other | Admitting: Pharmacist Clinician (PhC)/ Clinical Pharmacy Specialist

## 2019-07-06 ENCOUNTER — Other Ambulatory Visit: Payer: Self-pay

## 2019-07-06 DIAGNOSIS — I10 Essential (primary) hypertension: Secondary | ICD-10-CM | POA: Diagnosis not present

## 2019-07-06 MED ORDER — AMLODIPINE BESYLATE 10 MG PO TABS: 10.0000 mg | ORAL_TABLET | Freq: Every day | ORAL | 1 refills | Status: DC

## 2019-07-06 NOTE — Patient Instructions (Signed)
Return for a a follow up appointment May 6  Check your blood pressure at home daily and keep record of the readings.  Take your BP meds as follows:  Add amlodipine 5 mg (1/2 tablet) daily for 10 days, then increase to 1 tablet daily.    Continue with all other medications.  Bring all of your meds, your BP cuff and your record of home blood pressures to your next appointment.  Exercise as you're able, try to walk approximately 30 minutes per day.  Keep salt intake to a minimum, especially watch canned and prepared boxed foods.  Eat more fresh fruits and vegetables and fewer canned items.  Avoid eating in fast food restaurants.    HOW TO TAKE YOUR BLOOD PRESSURE: . Rest 5 minutes before taking your blood pressure. .  Don't smoke or drink caffeinated beverages for at least 30 minutes before. . Take your blood pressure before (not after) you eat. . Sit comfortably with your back supported and both feet on the floor (don't cross your legs). . Elevate your arm to heart level on a table or a desk. . Use the proper sized cuff. It should fit smoothly and snugly around your bare upper arm. There should be enough room to slip a fingertip under the cuff. The bottom edge of the cuff should be 1 inch above the crease of the elbow. . Ideally, take 3 measurements at one sitting and record the average.

## 2019-07-06 NOTE — Progress Notes (Signed)
07/07/2019 [name] [birth year] [id number]   HPI:  Erica [name] is a 44 y.o. female patient of Dr Oval Linsey, with a [place] below who presents today for hypertension clinic follow up.  She was seen by Dr. Oval Linsey in early February and found to have a BP of 207/98.  At that time she was on 5 medications, including clonidine 0.3 mg patch, diltiazem 420 mg, hydralazine 123XX123 mg tid, Bystolic 40 mg, spironolactone 100 mg and valsartan 320 mg, all at maximum doses.  Dr. Blenda Mounts nurse called her pharmacy to check compliance and the following was noted:  Clonidine patch - last filled 90 days in October 2020  Hydralazine 100 mg - last filled 90 days in January 2021  Torsemide and valsartan last filled for 90 days in July 2020   As of Feb 5 she had never filled the spironolactone or Bystolic  Diltiazem had not been filled since Dec 2019  When confronted by Dr. Oval Linsey, pt stated that her pulmonologist always gives samples of Bystolic, and that otherwise the pharmacy is incorrect with its data.  (of note since being prescribed rosuvastatin  In 2019, her LDL has remained essentially unchanged).  At that visit doxazosin 4 mg daily was added.    Patients labs have almost always indicated a low potassium level despite being on 100 mg of spironolactone and 320 mg valsartan.  Spoke with pharmacist at [facility] in [place] to review compliance.  He notes that she picked up clonidine patches, valsartan and torsemide in February.  The diltiazem 420 mg was last filled in December 2019.  She did also pick up the doxazosin 4 mg in February and told him she would be back next week to get the higher strength, as the store was out of stock and she still had some 4 mg tablets.   Pharmacist at [facility] in [place] offered to have a discussion with her about compliance as he felt he knew her well enough to confront her.    Today she returns for follow up.  She admits that she has not taken the diltiazem in over a year, states she  thought one of her doctors told her to stop a med, but she stopped the wrong one.  States she has started using BiPAP on a nightly basis, but can only tolerate about 4 hours - gets claustrophobic.  She denies napping during the day, until her teenage daughter rolled her eyes, then admitted to taking a "cat nap" of about 20-30 minutes almost every day in the afternoon.   She was wearing a sleeveless dress and her clonidine patch was visible on her shoulder.   Past Medical History: CHF Chronic diastolic EF  asthma Followed by Dr. 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    Potential secondary causes of hypertension:  Renal artery stenosis - renal dopplers 09/2018 showed 1-59% stenosis on both left and right  Hyperaldosteronism - renin and aldosterone WNL 04/2019  Hyper/hypo thyroidism - TSH WNL   Sleep apnea - now using BiPAP  Herbals/drugs - went thru extensive list, pt denies all except caffeine  Blood Pressure Goal:  130/80  Current Medications:  Bystolic 40 mg qd, Clonidine 0.3 mg patch (QSunday), hydralazine 100 mg tid, diltiazem 420 mg qd, spironolactone 100 mg qd, valsartan 320 mg qd  Family Hx: falther deceased, no connection with family; no concerns with mother, currently 70; 1 sister (whole) smokes but no health issues; 4 children all healthy  at  25,22,12,8;   Social Hx:  No tobacco, no alcohol; 32 oz Pepsi daily; caffeine relieves pressure from shunt (pseudotumor cerebri)  Diet:  Home cooked meals; likes vegetables (zuccini, squash, bro/caul, salad);  only canned corn; occaional hamburer/sterakk, mostly chicken or seafood  Exercise: difficult to exercise d/t breathing issues  Home BP readings: no readings with her again today;  States mostly [id number] systolic  Intolerances: metoprolol caused hives  Labs:  1/29:  Na 142, K 3.6, Glu 103, BUN 9, SCr 1.26 GFR 59  Of note, potassium checked just a week later and found to be low at  3.3 (despite being on valsartan 320 and spironolactone 100)  Wt Readings from Last 3 Encounters:  07/06/19 205 lb 6.4 oz (93.2 kg)  06/16/19 205 lb 12.8 oz (93.4 kg)  06/08/19 208 lb (94.3 kg)   BP Readings from Last 3 Encounters:  07/06/19 (!) 186/96  06/23/19 (!) 148/88  06/16/19 (!) 170/88   Pulse Readings from Last 3 Encounters:  07/06/19 84  06/23/19 88  06/16/19 (!) 108    Current Outpatient Medications  Medication Sig Dispense Refill  . acetaminophen-codeine (TYLENOL #4) 300-60 MG tablet TAKE 1 TABLET BY MOUTH EVERY 4 (FOUR) HOURS AS NEEDED FOR MODERATE PAIN 30 tablet 0  . acetaZOLAMIDE (DIAMOX) 250 MG tablet TAKE 4 TABLETS BY MOUTH 3 TIMES DAILY. 90 tablet 1  . albuterol (PROVENTIL) (2.5 MG/3ML) 0.083% nebulizer solution USE 1 VIAL VIA NEBULIZER EVERY 4 HOURS AND AS NEEDED FOR WHEEZING OR SHORTNESS OF BREATH 75 mL 1  . albuterol (VENTOLIN HFA) 108 (90 Base) MCG/ACT inhaler INHALE 2 PUFFS BY MOUTH EVERY 4 HOURS AS NEEDED FOR WHEEZE OR FOR SHORTNESS OF BREATH 18 Inhaler 1  . amitriptyline (ELAVIL) 100 MG tablet TAKE 1 TABLET BY MOUTH EVERYDAY AT BEDTIME 90 tablet 0  . amLODipine (NORVASC) 10 MG tablet Take 1 tablet (10 mg total) by mouth daily. Take 1/2 tablet daily for first 10 days then increase to 1 tablet daily 90 tablet 1  . Biotin 5000 MCG TABS Take 5,000 mg by mouth 2 (two) times daily.     . Cholecalciferol 1.25 MG (50000 UT) capsule Take 1 capsule (50,000 Units total) by mouth once a week. 12 capsule 1  . cloNIDine (CATAPRES - DOSED IN MG/24 HR) 0.3 mg/24hr patch PLACE 1 PATCH ONTO SKIN ONCE WEEKLY 12 patch 1  . clopidogrel (PLAVIX) 75 MG tablet Take 1 tablet (75 mg total) by mouth daily. 90 tablet 3  . clotrimazole (MYCELEX) 10 MG troche Take 1 tablet (10 mg total) by mouth 5 (five) times daily. 30 tablet 11  . dexlansoprazole (DEXILANT) 60 MG capsule Take 1 capsule (60 mg total) by mouth daily. 90 capsule 3  . dextromethorphan-guaiFENesin (MUCINEX DM) 30-600 MG 12hr  tablet Take 2 tablets by mouth 2 (two) times daily.     . Diclofenac Sodium 2 % SOLN Place 2 g onto the skin 2 (two) times daily. 112 g 3  . diltiazem (CARDIZEM LA) 420 MG 24 hr tablet Take 1 tablet (420 mg total) by mouth daily. 90 tablet 0  . doxazosin (CARDURA) 8 MG tablet Take 1 tablet (8 mg total) by mouth daily. 90 tablet 3  . DULoxetine (CYMBALTA) 30 MG capsule Take 1 capsule (30 mg total) by mouth daily. With 60mg  for total of 90 mg 30 capsule 1  . DULoxetine (CYMBALTA) 60 MG capsule Take 1 capsule (60 mg total) by mouth daily. For total of 90 mg 30 capsule  1  . EPINEPHRINE 0.3 mg/0.3 mL IJ SOAJ injection INJECT 0.3 MLS INTO THE MUSCLE ONCE AS NEEDED FOR ANAPHYLAXIS 2 Device 2  . ezetimibe (ZETIA) 10 MG tablet Take 1 tablet (10 mg total) by mouth daily. 90 tablet 1  . famotidine (PEPCID) 20 MG tablet Take 1-2 tablets (20-40 mg total) by mouth at bedtime. 180 tablet 1  . hydrALAZINE (APRESOLINE) 100 MG tablet TAKE 1 TABLET (100 MG TOTAL) BY MOUTH 3 (THREE) TIMES DAILY. 270 tablet 1  . lidocaine-prilocaine (EMLA) cream Apply 1 application topically every 28 (twenty-eight) days. Apply 1 application to skin ever 28 days    . metoCLOPramide (REGLAN) 10 MG tablet Take 1 tablet (10 mg total) by mouth 4 (four) times daily. 120 tablet 2  . montelukast (SINGULAIR) 10 MG tablet Take 1 tablet (10 mg total) by mouth at bedtime. 90 tablet 1  . Nebivolol HCl 20 MG TABS Take 2 tablets (40 mg total) by mouth daily. 180 tablet 3  . NITROSTAT 0.4 MG SL tablet PLACE 1 TABLET UNDER TONGUE EVERY 5 MINUTES AS NEEDED FOR CHEST PAIN, MAX OF 3 TABLETS THEN CALL 911 IF PAIN PERSISTS 100 tablet 0  . oxymetazoline (MUCINEX SINUS-MAX FULL FORCE) 0.05 % nasal spray Place 1 spray into both nostrils 2 (two) times daily.     . Potassium Chloride ER 20 MEQ TBCR TAKE 1 TABLET BY MOUTH TWICE A DAY 180 tablet 0  . predniSONE (DELTASONE) 10 MG tablet Take  4 each am x 2 days,   2 each am x 2 days,  1 each am x 2 days and stop 14  tablet 0  . primidone (MYSOLINE) 50 MG tablet TAKE ONE-HALF TABLET (25 MG TOTAL) BY MOUTH AT BEDTIME. 15 tablet 0  . promethazine (PHENERGAN) 25 MG suppository Place 25 mg rectally every 6 (six) hours as needed for nausea or vomiting.    . promethazine (PHENERGAN) 25 MG tablet Take 1 tablet (25 mg total) by mouth 3 (three) times daily as needed for nausea or vomiting. 270 tablet 1  . ranolazine (RANEXA) 500 MG 12 hr tablet TAKE 1 TABLET BY MOUTH TWICE A DAY 180 tablet 2  . Respiratory Therapy Supplies (FLUTTER) DEVI Use as directed 1 each 0  . rosuvastatin (CRESTOR) 40 MG tablet Take 1 tablet (40 mg total) by mouth daily. 90 tablet 2  . Spacer/Aero-Holding Chambers (AEROCHAMBER MV) inhaler Use as instructed 1 each 0  . spironolactone (ALDACTONE) 100 MG tablet TAKE 1 TABLET BY MOUTH EVERY DAY 90 tablet 2  . SYMBICORT 160-4.5 MCG/ACT inhaler INHALE 2 PUFFS INTO LUNGS 2 TIMES DAILY AS DIRECTED 10.2 g 11  . torsemide (DEMADEX) 20 MG tablet Take 2 tablets (40 mg total) by mouth daily. 180 tablet 1  . valsartan (DIOVAN) 320 MG tablet TAKE 1 TABLET BY MOUTH EVERY DAY 90 tablet 1   No current facility-administered medications for this visit.    Allergies  Allergen Reactions  . Aspirin Shortness Of Breath and Palpitations    Pt can take ibuprofen without reaction  . Doxycycline Anaphylaxis  . Kiwi Extract Itching and Swelling    Lip swelling  . Metoprolol Hives and Itching  . Penicillins Shortness Of Breath and Palpitations    Has patient had a PCN reaction causing immediate rash, facial/tongue/throat swelling, SOB or lightheadedness with hypotension: Yes Has patient had a PCN reaction causing severe rash involving mucus membranes or skin necrosis: Yes Has patient had a PCN reaction that required hospitalization Yes Has patient  had a PCN reaction occurring within the last 10 years: No If all of the above answers are "NO", then may proceed with Cephalosporin use.  . Strawberry Extract Hives  .  Sulfamethoxazole-Trimethoprim Anaphylaxis    Facial, throat and tongue swelling with difficulty swallowing.    Past Medical History:  Diagnosis Date  . Allergy   . Anxiety   . Arthritis   . Asthma   . Blood transfusion without reported diagnosis   . Breast cancer ([place]) 2010   a. locally advanced left breast carcinoma diagnosed in 2010 and treated with neoadjuvant chemotherapy with docetaxel and cyclophosphamide as well as paclitaxel. This was followed by radiation therapy which was completed in 2011.  Marland Kitchen CHF (congestive heart failure) ([place])   . Chronic kidney disease   . Dyspnea on exertion    a. 01/2013 Lexi MV: EF 54%, no ischemia/infarct;  b. 05/2013 Echo: EF 60-65%, no rwma, Gr 2 DD;  b.   . Fatty liver   . Gastroparesis   . GERD (gastroesophageal reflux disease)   . Hypertension   . Impaired glucose tolerance 04/13/2011  . Lymphadenitis, chronic    restricted LEFT extremity  . 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 OSA (obstructive sleep apnea) 05/04/2014   AHI 31/hr now on CPAP at 12cm H2O  . Oxygen deficiency    Uses 02 in CPAP  . Personal history of chemotherapy 2010  . Personal history of radiation therapy 2010  . Sleep apnea     Blood pressure (!) 186/96, pulse 84, height 5\' 5"  (1.651 m), weight 205 lb 6.4 oz (93.2 kg).  Essential hypertension Patient with uncontrolled hypertension, still with no home readings to show.  She did admit to being off diltiazem 420 mg for over a year, but insists compliant with all other medications.  Per chart was on amlodipine in the past, so will re-start her on that.  She is to take 5 mg daily for 10 days, then increase to 10 mg daily.  We will see her back in another month for her 3rd visit, after which she will need to be seen by Dr. Oval Linsey.  It was stressed to her that she will need to bring her home cuff and BP readings with her.  Erica [name] PharmD CPP [facility] [address] [place], [place] [address] [phone]

## 2019-07-07 ENCOUNTER — Encounter: Payer: Self-pay | Admitting: Adult Health

## 2019-07-07 ENCOUNTER — Ambulatory Visit (INDEPENDENT_AMBULATORY_CARE_PROVIDER_SITE_OTHER): Payer: Medicare Other | Admitting: Adult Health

## 2019-07-07 DIAGNOSIS — G4733 Obstructive sleep apnea (adult) (pediatric): Secondary | ICD-10-CM | POA: Diagnosis not present

## 2019-07-07 DIAGNOSIS — J4540 Moderate persistent asthma, uncomplicated: Secondary | ICD-10-CM | POA: Diagnosis not present

## 2019-07-07 MED ORDER — ACETAMINOPHEN-CODEINE 300-60 MG PO TABS: 1.0000 | ORAL_TABLET | Freq: Four times a day (QID) | ORAL | 0 refills | Status: DC | PRN

## 2019-07-07 NOTE — Assessment & Plan Note (Addendum)
Continue on nocturnal BiPAP.  Follow-up with cardiology as planned.

## 2019-07-07 NOTE — Progress Notes (Signed)
@Patient  ID: Erica [name], female    DOB: 1975/04/08, 44 y.o.   MRN: [id number]  Chief Complaint  Patient presents with  . Follow-up    asthma     Referring provider: [name], MD  HPI: 44 year old female followed for asthma Medical history significant for breast cancer February 2010 status post chemo/mastectomy/XRT, HTN  Obstructive sleep apnea on nocturnal CPAP-followed by Dr. Radford Pax   TEST/EVENTS :  PFT's 01/31/2011 FEV1 1.76 (60% ) ratio 68 and no better p B2, DLCO 70% -med review 09/25/2012 >Dulera drug assistance paperwork given - HFA 90% 11/20/2012 .  -Med calendar 12/07/12 , 04/06/2013 , 04/18/2014,, 08/30/2014 , 11/08/2014  - PFT's 01/12/2013 1.73 (65%) Ratio 73 and no better p B2, DLCO 86% - trial off singulair 02/08/13 >>>did not stop  - 04/05/2014 trial of dulera 100 instead of 200 >worse  -04/18/2014 increase Dulera 200  - 08/01/2014 p extensive coaching HFA effectiveness = 90% - 08/01/2014 Walked RA x 2laps @ 185 ft each stopped due to cough >sob/ no desat moderate pace  - spirometry 08/01/2014 FEV1 1.87 (70%) ratio 78 fef 25-75 53%  - spirometry 02/10/2015 flattening of top portion of f/v loop only  - 02/10/2015 Walked RA 2 laps @ 185 ft each stopped due to cough >sob/ slow pace  - NO 03/24/2015 = 16 - Methacholine challenge 04/04/2015 POS asthma = wheeze/sob but no coughing fits >added qvar 80 2bid 06/29/2015 >>> -flare sarting Aug 19 2015 p exp to storage shed  - 08/31/2015 After extensive coaching HFA effectiveness = 75% (Ti too short)  - Spirometry 08/31/2015 FEV1 0.75 (28%) Ratio 68  - FENO 11/17/2015 = 8 on symb 160 2bid and qvar 80 2bid  - FENO 03/22/2016 = 36 on symb 160/qvar 80 though not able to verify being used as directed  -MRI 05/18/15 >Paranasal sinuses are clear - 11/17/2015 rec increase h1 to 2 hs to help with noct cough  - Allergy profile 11/17/2015 >Eos 0.1 / IgE 80 POS RAST to grass, ragweed, cedar tree   07/07/2019 Follow up : Asthma  Patient presents for a 2  month follow-up for asthma.  Last visit was having a mild asthma flare with allergic rhinitis.  She was given a prednisone taper.  Patient says she is feeling much better.  Her wheezing has improved.  She denies any increased cough congestion or chest pain.  She remains on Symbicort 2 puffs twice daily. Chest x-ray last visit showed no acute process. We reviewed all her medications and organize them into a medication calendar with patient education.  It appears she is taking her medications correctly  She is being followed by cardiology for severe hypertension.  She has obstructive sleep apnea is on nocturnal BiPAP -she is followed by Dr. Radford Pax.    Allergies  Allergen Reactions  . Aspirin Shortness Of Breath and Palpitations    Pt can take ibuprofen without reaction  . Doxycycline Anaphylaxis  . Kiwi Extract Itching and Swelling    Lip swelling  . Metoprolol Hives and Itching  . Penicillins Shortness Of Breath and Palpitations    Has patient had a PCN reaction causing immediate rash, facial/tongue/throat swelling, SOB or lightheadedness with hypotension: Yes Has patient had a PCN reaction causing severe rash involving mucus membranes or skin necrosis: Yes Has patient had a PCN reaction that required hospitalization Yes Has patient had a PCN reaction occurring within the last 10 years: No If all of the above answers are "NO", then  may proceed with Cephalosporin use.  . Strawberry Extract Hives  . Sulfamethoxazole-Trimethoprim Anaphylaxis    Facial, throat and tongue swelling with difficulty swallowing.    Immunization History  Administered Date(s) Administered  . Influenza Split 12/31/2010, 03/02/2012, 12/01/2015  . Influenza,inj,Quad PF,6+ Mos 02/09/2013, 02/02/2014, 12/06/2014, 12/05/2015, 02/04/2017, 03/06/2018, 12/04/2018  . Pneumococcal Conjugate-13 12/01/2014  . Pneumococcal Polysaccharide-23 03/02/2012  . Tdap 05/12/2011     Past Medical History:  Diagnosis Date  . Allergy   . Anxiety   . Arthritis   . Asthma   . Blood transfusion without reported diagnosis   . Breast cancer ([place]) 2010   a. locally advanced left breast carcinoma diagnosed in 2010 and treated with neoadjuvant chemotherapy with docetaxel and cyclophosphamide as well as paclitaxel. This was followed by radiation therapy which was completed in 2011.  Marland Kitchen CHF (congestive heart failure) ([place])   . Chronic kidney disease   . Dyspnea on exertion    a. 01/2013 Lexi MV: EF 54%, no ischemia/infarct;  b. 05/2013 Echo: EF 60-65%, no rwma, Gr 2 DD;  b.   . Fatty liver   . Gastroparesis   . GERD (gastroesophageal reflux disease)   . Hypertension   . Impaired glucose tolerance 04/13/2011  . Lymphadenitis, chronic    restricted LEFT extremity  . 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 OSA (obstructive sleep apnea) 05/04/2014   AHI 31/hr now on CPAP at 12cm H2O  . Oxygen deficiency    Uses 02 in CPAP  . Personal history of chemotherapy 2010  . Personal history of radiation therapy 2010  . Sleep apnea     Tobacco History: Social History   Tobacco Use  Smoking Status Never Smoker  Smokeless Tobacco Never Used   Counseling given: Not Answered   Outpatient Medications Prior to Visit  Medication Sig Dispense Refill  . acetaZOLAMIDE (DIAMOX) 250 MG tablet TAKE 4 TABLETS BY MOUTH 3 TIMES DAILY. 90 tablet 1  . albuterol (PROVENTIL) (2.5 MG/3ML) 0.083% nebulizer solution USE 1 VIAL VIA NEBULIZER EVERY 4 HOURS AND AS NEEDED FOR WHEEZING OR SHORTNESS OF BREATH 75 mL 1  . albuterol (VENTOLIN HFA) 108 (90 Base) MCG/ACT inhaler INHALE 2 PUFFS BY MOUTH EVERY 4 HOURS AS NEEDED FOR WHEEZE OR FOR SHORTNESS OF BREATH 18 Inhaler 1  . amitriptyline (ELAVIL) 100 MG tablet TAKE 1 TABLET BY MOUTH EVERYDAY AT BEDTIME 90 tablet 0  . amLODipine (NORVASC) 10 MG tablet Take 1 tablet (10 mg total) by mouth daily. Take 1/2 tablet daily for first 10 days then increase to 1  tablet daily 90 tablet 1  . Biotin 5000 MCG TABS Take 5,000 mg by mouth 2 (two) times daily.     . Cholecalciferol 1.25 MG (50000 UT) capsule Take 1 capsule (50,000 Units total) by mouth once a week. 12 capsule 1  . cloNIDine (CATAPRES - DOSED IN MG/24 HR) 0.3 mg/24hr patch PLACE 1 PATCH ONTO SKIN ONCE WEEKLY 12 patch 1  . clopidogrel (PLAVIX) 75 MG tablet Take 1 tablet (75 mg total) by mouth daily. 90 tablet 3  . clotrimazole (MYCELEX) 10 MG troche Take 1 tablet (10 mg total) by mouth 5 (five) times daily. 30 tablet 11  . dexlansoprazole (DEXILANT) 60 MG capsule Take 1 capsule (60 mg total) by mouth daily. 90 capsule 3  . dextromethorphan-guaiFENesin (MUCINEX DM) 30-600 MG 12hr tablet Take 2 tablets by mouth 2 (two) times daily.     . Diclofenac Sodium 2 % SOLN Place 2 g onto  the skin 2 (two) times daily. 112 g 3  . doxazosin (CARDURA) 8 MG tablet Take 1 tablet (8 mg total) by mouth daily. 90 tablet 3  . DULoxetine (CYMBALTA) 30 MG capsule Take 1 capsule (30 mg total) by mouth daily. With 60mg  for total of 90 mg 30 capsule 1  . DULoxetine (CYMBALTA) 60 MG capsule Take 1 capsule (60 mg total) by mouth daily. For total of 90 mg 30 capsule 1  . EPINEPHRINE 0.3 mg/0.3 mL IJ SOAJ injection INJECT 0.3 MLS INTO THE MUSCLE ONCE AS NEEDED FOR ANAPHYLAXIS 2 Device 2  . ezetimibe (ZETIA) 10 MG tablet Take 1 tablet (10 mg total) by mouth daily. 90 tablet 1  . famotidine (PEPCID) 20 MG tablet Take 1-2 tablets (20-40 mg total) by mouth at bedtime. 180 tablet 1  . hydrALAZINE (APRESOLINE) 100 MG tablet TAKE 1 TABLET (100 MG TOTAL) BY MOUTH 3 (THREE) TIMES DAILY. 270 tablet 1  . lidocaine-prilocaine (EMLA) cream Apply 1 application topically every 28 (twenty-eight) days. Apply 1 application to skin ever 28 days    . metoCLOPramide (REGLAN) 10 MG tablet Take 1 tablet (10 mg total) by mouth 4 (four) times daily. 120 tablet 2  . montelukast (SINGULAIR) 10 MG tablet Take 1 tablet (10 mg total) by mouth at bedtime. 90  tablet 1  . NITROSTAT 0.4 MG SL tablet PLACE 1 TABLET UNDER TONGUE EVERY 5 MINUTES AS NEEDED FOR CHEST PAIN, MAX OF 3 TABLETS THEN CALL 911 IF PAIN PERSISTS 100 tablet 0  . oxymetazoline (MUCINEX SINUS-MAX FULL FORCE) 0.05 % nasal spray Place 1 spray into both nostrils 2 (two) times daily.     . Potassium Chloride ER 20 MEQ TBCR TAKE 1 TABLET BY MOUTH TWICE A DAY 180 tablet 0  . primidone (MYSOLINE) 50 MG tablet TAKE ONE-HALF TABLET (25 MG TOTAL) BY MOUTH AT BEDTIME. 15 tablet 0  . promethazine (PHENERGAN) 25 MG suppository Place 25 mg rectally every 6 (six) hours as needed for nausea or vomiting.    . promethazine (PHENERGAN) 25 MG tablet Take 1 tablet (25 mg total) by mouth 3 (three) times daily as needed for nausea or vomiting. 270 tablet 1  . ranolazine (RANEXA) 500 MG 12 hr tablet TAKE 1 TABLET BY MOUTH TWICE A DAY 180 tablet 2  . Respiratory Therapy Supplies (FLUTTER) DEVI Use as directed 1 each 0  . rosuvastatin (CRESTOR) 40 MG tablet Take 1 tablet (40 mg total) by mouth daily. 90 tablet 2  . Spacer/Aero-Holding Chambers (AEROCHAMBER MV) inhaler Use as instructed 1 each 0  . spironolactone (ALDACTONE) 100 MG tablet TAKE 1 TABLET BY MOUTH EVERY DAY 90 tablet 2  . SYMBICORT 160-4.5 MCG/ACT inhaler INHALE 2 PUFFS INTO LUNGS 2 TIMES DAILY AS DIRECTED 10.2 g 11  . torsemide (DEMADEX) 20 MG tablet Take 2 tablets (40 mg total) by mouth daily. 180 tablet 1  . valsartan (DIOVAN) 320 MG tablet TAKE 1 TABLET BY MOUTH EVERY DAY 90 tablet 1  . acetaminophen-codeine (TYLENOL #4) 300-60 MG tablet TAKE 1 TABLET BY MOUTH EVERY 4 (FOUR) HOURS AS NEEDED FOR MODERATE PAIN 30 tablet 0  . diltiazem (CARDIZEM LA) 420 MG 24 hr tablet Take 1 tablet (420 mg total) by mouth daily. 90 tablet 0  . Nebivolol HCl 20 MG TABS Take 2 tablets (40 mg total) by mouth daily. 180 tablet 3  . predniSONE (DELTASONE) 10 MG tablet Take  4 each am x 2 days,   2 each am x 2  days,  1 each am x 2 days and stop 14 tablet 0   No  facility-administered medications prior to visit.     Review of Systems:   Constitutional:   No  weight loss, night sweats,  Fevers, chills, + fatigue, or  lassitude.  HEENT:   No headaches,  Difficulty swallowing,  Tooth/dental problems, or  Sore throat,                No sneezing, itching, ear ache, nasal congestion, post nasal drip,   CV:  No chest pain,  Orthopnea, PND, swelling in lower extremities, anasarca, dizziness, palpitations, syncope.   GI  No heartburn, indigestion, abdominal pain, nausea, vomiting, diarrhea, change in bowel habits, loss of appetite, bloody stools.   Resp:    No chest wall deformity  Skin: no rash or lesions.  GU: no dysuria, change in color of urine, no urgency or frequency.  No flank pain, no hematuria   MS:  No joint pain or swelling.  No decreased range of motion.  No back pain.    Physical Exam  Pulse 86   Temp 98.2 F (36.8 C) (Temporal)   Ht 5\' 5"  (1.651 m)   Wt 206 lb (93.4 kg)   SpO2 97% Comment: room air  BMI 34.28 kg/m   GEN: A/Ox3; pleasant , NAD, well nourished    HEENT:  [place]/AT,    NOSE-clear, THROAT-clear, no lesions, no postnasal drip or exudate noted.   NECK:  Supple w/ fair ROM; no JVD; normal carotid impulses w/o bruits; no thyromegaly or nodules palpated; no lymphadenopathy.    RESP  Clear  P & A; w/o, wheezes/ rales/ or rhonchi. no accessory muscle use, no dullness to percussion  CARD:  RRR, no m/r/g, no peripheral edema, pulses intact, no cyanosis or clubbing.  GI:   Soft & nt; nml bowel sounds; no organomegaly or masses detected.   Musco: Warm bil, no deformities or joint swelling noted.   Neuro: alert, no focal deficits noted.    Skin: Warm, no lesions or rashes    Lab Results:   BNP No results found for: BNP  ProBNP    Component Value Date/Time   PROBNP 346 (H) 04/30/2019 1016   PROBNP 16.0 08/01/2014 1120    Imaging:   PFT Results Latest Ref Rng & Units 04/04/2015  FVC-Pre L 2.24   FVC-Predicted Pre % 70  FVC-Post L 2.26  FVC-Predicted Post % 71  Pre FEV1/FVC % % 76  Post FEV1/FCV % % 78  FEV1-Pre L 1.71  FEV1-Predicted Pre % 65  FEV1-Post L 1.77    Lab Results  Component Value Date   NITRICOXIDE 8 11/17/2015        Assessment & Plan:   Asthma without status asthmaticus Recent flare now resolved.  Patient is doing well on her current regimen.  Patient's medications were reviewed today and patient education was given. Computerized medication calendar was adjusted/completed   Plan  Patient Instructions  Follow med calendar closely and bring to each visit.  Brush/rinse and gargle after inhalers.  Follow up Dr. Melvyn Novas  In 3 months and As needed          OSA (obstructive sleep apnea) Continue on nocturnal BiPAP.  Follow-up with cardiology as planned.     Rexene Edison, NP 07/07/2019

## 2019-07-07 NOTE — Assessment & Plan Note (Signed)
Recent flare now resolved.  Patient is doing well on her current regimen.  Patient's medications were reviewed today and patient education was given. Computerized medication calendar was adjusted/completed   Plan  Patient Instructions  Follow med calendar closely and bring to each visit.  Brush/rinse and gargle after inhalers.  Follow up Dr. Melvyn Novas  In 3 months and As needed

## 2019-07-07 NOTE — Assessment & Plan Note (Signed)
Patient with uncontrolled hypertension, still with no home readings to show.  She did admit to being off diltiazem 420 mg for over a year, but insists compliant with all other medications.  Per chart was on amlodipine in the past, so will re-start her on that.  She is to take 5 mg daily for 10 days, then increase to 10 mg daily.  We will see her back in another month for her 3rd visit, after which she will need to be seen by Dr. Oval Linsey.  It was stressed to her that she will need to bring her home cuff and BP readings with her.

## 2019-07-07 NOTE — Patient Instructions (Signed)
Follow med calendar closely and bring to each visit.  Brush/rinse and gargle after inhalers.  Follow up Dr. Melvyn Novas  In 3 months and As needed

## 2019-07-09 NOTE — Telephone Encounter (Signed)
Settings for Bipap (17/13 cm H20)  faxed to [facility] at [phone].

## 2019-07-19 ENCOUNTER — Encounter: Payer: Self-pay | Admitting: Neurology

## 2019-07-19 ENCOUNTER — Other Ambulatory Visit: Payer: Self-pay

## 2019-07-19 ENCOUNTER — Ambulatory Visit (INDEPENDENT_AMBULATORY_CARE_PROVIDER_SITE_OTHER): Payer: Medicare Other | Admitting: Neurology

## 2019-07-19 VITALS — BP 202/124 | HR 92 | Resp 20 | Ht 66.0 in | Wt 207.0 lb

## 2019-07-19 DIAGNOSIS — G932 Benign intracranial hypertension: Secondary | ICD-10-CM

## 2019-07-19 DIAGNOSIS — T451X5A Adverse effect of antineoplastic and immunosuppressive drugs, initial encounter: Secondary | ICD-10-CM | POA: Diagnosis not present

## 2019-07-19 DIAGNOSIS — G250 Essential tremor: Secondary | ICD-10-CM | POA: Diagnosis not present

## 2019-07-19 DIAGNOSIS — G620 Drug-induced polyneuropathy: Secondary | ICD-10-CM

## 2019-07-19 MED ORDER — ACETAZOLAMIDE 250 MG PO TABS: 1000.0000 mg | ORAL_TABLET | Freq: Three times a day (TID) | ORAL | 3 refills | Status: AC

## 2019-07-19 MED ORDER — PRIMIDONE 50 MG PO TABS: 25.0000 mg | ORAL_TABLET | Freq: Every day | ORAL | 3 refills | Status: AC

## 2019-07-19 MED ORDER — DULOXETINE HCL 30 MG PO CPEP: 30.0000 mg | ORAL_CAPSULE | Freq: Every day | ORAL | 3 refills | Status: DC

## 2019-07-19 MED ORDER — DULOXETINE HCL 60 MG PO CPEP: 60.0000 mg | ORAL_CAPSULE | Freq: Every day | ORAL | 3 refills | Status: DC

## 2019-07-19 NOTE — Patient Instructions (Signed)
Refills have been provided for Cymbalta 90mg  daily, Diamox 1g three times daily, and primidone 25mg  at bedtime See pain management for chronic pain care Follow-up with your eye doctors  Return to clinic in 1 year

## 2019-07-19 NOTE — Progress Notes (Signed)
Follow-up Visit   Date: 07/19/19    Erica [name] MRN: [id number] DOB: [birth year]   Interim History: Erica [name] is a 44 y.o. 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    History of present illness: Starting in 2010 after her second course of chemotherapy she developed chemotherapy induced neuropathy with painful paresthesias and weakness of the hands, especially fine motor tasks such as buttoning, tying shoe laces, and braiding her daughter's hair. She falls about once per month because her legs give out. She was started on Lyrica 50mg  TID which has been titrated to 200mg  TID.  She reports having spells of syncope, which seems to only occur when she is sitting. There is loss of consciousness, lasting only a few seconds.  Following the spells, there is no fatigue, confusion, or weakness. On one ocassion, she felt herself leaning to the side and caught herself when at therapy. She is unaware of the spells.  Routine EEG was normal.  MRI/V brain has been normal.   She history of refractory pseudotumor cerebri and has been on diamox, topirmate, and lasix.  Additionally, she continues to have 1-2 elective high volume taps annually for headaches.  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 In 2016, her ophthalmologist, Dr. Vista Mink noticed new findings of severe left temporal visual field deficit involving her left eye (good eye). Her imaging did not disclose any intracranial abnormality to account for symptoms.  She underwent LP with OP of 27cm of water.  Since  her LP, she reports feeling as if her vision is better and eyes don't feel as foggy anymore.   She was evaluated by neurosurgery for shunt evaluation, but because of no papilledema, did not feel that she would be a good candidate.  She was seen by Dr. by Darrin Luis, opthalmology at [facility] in early 2017, who noted severe nerve fiber layer loss stating that papilledema is not a good marker for elevated ICP because she has such few nerve fibers.    In July 2017, she sought opinion of Dr. Theda Sers at [facility] who recommended shunting as the only option to control headaches and preserve vision.   In October 2017, she had VP shunt placed at [facility] but has not noticed any improvement with her headaches.  She is on Diamox 1000mg  TID and topirmate 50mg  BID.   UPDATE 07/19/2019:  She is here for follow-up visit and requesting refills.  She was last seen in April 2020. Overall, symptoms are stable with respect to hand tremors and neuropathy.  Her vision seems to be slightly worse.  She has not seen her opthalmologist in over a year due to [place] and has not had shunt evaluation by neurosurgery since initial placement in 2017. She is seeking to establish care with pain management and is no longer on Lyrica or amitriptyline.   Her blood pressure remains severely elevated, today 202/124, which she says is normal for her.  She is also seeing BP clinic in cardiology.    Medications:  Current Outpatient Medications on File Prior to Visit  Medication Sig Dispense Refill  . acetaminophen-codeine (TYLENOL #4) 300-60 MG tablet Take 1 tablet  by mouth every 6 (six) hours as needed for pain. 30 tablet 0  . acetaZOLAMIDE (DIAMOX) 250 MG tablet TAKE 4 TABLETS BY MOUTH 3 TIMES DAILY. 90 tablet 1  . albuterol (PROVENTIL) (2.5 MG/3ML) 0.083% nebulizer solution USE 1 VIAL VIA NEBULIZER EVERY 4 HOURS AND AS NEEDED FOR WHEEZING OR SHORTNESS OF BREATH 75 mL 1  . albuterol (VENTOLIN HFA) 108 (90 Base) MCG/ACT inhaler INHALE 2 PUFFS BY  MOUTH EVERY 4 HOURS AS NEEDED FOR WHEEZE OR FOR SHORTNESS OF BREATH 18 Inhaler 1  . amitriptyline (ELAVIL) 100 MG tablet TAKE 1 TABLET BY MOUTH EVERYDAY AT BEDTIME 90 tablet 0  . amLODipine (NORVASC) 10 MG tablet Take 1 tablet (10 mg total) by mouth daily. Take 1/2 tablet daily for first 10 days then increase to 1 tablet daily 90 tablet 1  . Biotin 5000 MCG TABS Take 5,000 mg by mouth 2 (two) times daily.     . Cholecalciferol 1.25 MG (50000 UT) capsule Take 1 capsule (50,000 Units total) by mouth once a week. 12 capsule 1  . cloNIDine (CATAPRES - DOSED IN MG/24 HR) 0.3 mg/24hr patch PLACE 1 PATCH ONTO SKIN ONCE WEEKLY 12 patch 1  . clopidogrel (PLAVIX) 75 MG tablet Take 1 tablet (75 mg total) by mouth daily. 90 tablet 3  . clotrimazole (MYCELEX) 10 MG troche Take 1 tablet (10 mg total) by mouth 5 (five) times daily. 30 tablet 11  . dexlansoprazole (DEXILANT) 60 MG capsule Take 1 capsule (60 mg total) by mouth daily. 90 capsule 3  . dextromethorphan-guaiFENesin (MUCINEX DM) 30-600 MG 12hr tablet Take 2 tablets by mouth 2 (two) times daily.     . Diclofenac Sodium 2 % SOLN Place 2 g onto the skin 2 (two) times daily. 112 g 3  . doxazosin (CARDURA) 8 MG tablet Take 1 tablet (8 mg total) by mouth daily. 90 tablet 3  . DULoxetine (CYMBALTA) 30 MG capsule Take 1 capsule (30 mg total) by mouth daily. With 60mg  for total of 90 mg 30 capsule 1  . DULoxetine (CYMBALTA) 60 MG capsule Take 1 capsule (60 mg total) by mouth daily. For total of 90 mg 30 capsule 1  . EPINEPHRINE 0.3 mg/0.3 mL IJ SOAJ injection INJECT 0.3 MLS INTO THE MUSCLE ONCE AS NEEDED FOR ANAPHYLAXIS 2 Device 2  . ezetimibe (ZETIA) 10 MG tablet Take 1 tablet (10 mg total) by mouth daily. 90 tablet 1  . famotidine (PEPCID) 20 MG tablet Take 1-2 tablets (20-40 mg total) by mouth at bedtime. 180 tablet 1  . hydrALAZINE (APRESOLINE) 100 MG tablet TAKE 1 TABLET (100 MG TOTAL) BY MOUTH 3 (THREE) TIMES DAILY. 270 tablet 1  . lidocaine-prilocaine  (EMLA) cream Apply 1 application topically every 28 (twenty-eight) days. Apply 1 application to skin ever 28 days    . metoCLOPramide (REGLAN) 10 MG tablet Take 1 tablet (10 mg total) by mouth 4 (four) times daily. 120 tablet 2  . montelukast (SINGULAIR) 10 MG tablet Take 1 tablet (10 mg total) by mouth at bedtime. 90 tablet 1  . NITROSTAT 0.4 MG SL tablet PLACE 1 TABLET UNDER TONGUE EVERY 5 MINUTES AS NEEDED FOR CHEST PAIN, MAX OF 3 TABLETS THEN CALL 911 IF PAIN PERSISTS 100 tablet 0  . oxymetazoline (MUCINEX SINUS-MAX FULL FORCE) 0.05 % nasal spray Place 1 spray into both nostrils 2 (two) times daily.     . Potassium Chloride ER 20 MEQ TBCR TAKE 1 TABLET BY MOUTH TWICE A DAY 180 tablet 0  .  primidone (MYSOLINE) 50 MG tablet TAKE ONE-HALF TABLET (25 MG TOTAL) BY MOUTH AT BEDTIME. 15 tablet 0  . promethazine (PHENERGAN) 25 MG suppository Place 25 mg rectally every 6 (six) hours as needed for nausea or vomiting.    . promethazine (PHENERGAN) 25 MG tablet Take 1 tablet (25 mg total) by mouth 3 (three) times daily as needed for nausea or vomiting. 270 tablet 1  . ranolazine (RANEXA) 500 MG 12 hr tablet TAKE 1 TABLET BY MOUTH TWICE A DAY 180 tablet 2  . Respiratory Therapy Supplies (FLUTTER) DEVI Use as directed 1 each 0  . rosuvastatin (CRESTOR) 40 MG tablet Take 1 tablet (40 mg total) by mouth daily. 90 tablet 2  . Spacer/Aero-Holding Chambers (AEROCHAMBER MV) inhaler Use as instructed 1 each 0  . spironolactone (ALDACTONE) 100 MG tablet TAKE 1 TABLET BY MOUTH EVERY DAY 90 tablet 2  . SYMBICORT 160-4.5 MCG/ACT inhaler INHALE 2 PUFFS INTO LUNGS 2 TIMES DAILY AS DIRECTED 10.2 g 11  . torsemide (DEMADEX) 20 MG tablet Take 2 tablets (40 mg total) by mouth daily. 180 tablet 1  . valsartan (DIOVAN) 320 MG tablet TAKE 1 TABLET BY MOUTH EVERY DAY 90 tablet 1   No current facility-administered medications on file prior to visit.    Allergies:  Allergies  Allergen Reactions  . Aspirin Shortness Of Breath  and Palpitations    Pt can take ibuprofen without reaction  . Doxycycline Anaphylaxis  . Kiwi Extract Itching and Swelling    Lip swelling  . Metoprolol Hives and Itching  . Penicillins Shortness Of Breath and Palpitations    Has patient had a PCN reaction causing immediate rash, facial/tongue/throat swelling, SOB or lightheadedness with hypotension: Yes Has patient had a PCN reaction causing severe rash involving mucus membranes or skin necrosis: Yes Has patient had a PCN reaction that required hospitalization Yes Has patient had a PCN reaction occurring within the last 10 years: No If all of the above answers are "NO", then may proceed with Cephalosporin use.  . Strawberry Extract Hives  . Sulfamethoxazole-Trimethoprim Anaphylaxis    Facial, throat and tongue swelling with difficulty swallowing.    Vital Signs:  BP (!) 202/124   Pulse 92   Resp 20   Ht 5\' 6"  (1.676 m)   Wt 207 lb (93.9 kg)   SpO2 98%   BMI 33.41 kg/m     Neurological Exam: MENTAL STATUS including orientation to time, place, person is normal.    CRANIAL NERVES:  She is blind in the right eye - perceives eye and gross movements.  Visual field testing of the left eye is shows reduced peripheral vision on the left lower temporal field.  Pupils equal round and reactive to light. Bilateral optic atrophy, no edema.  Face is symmetric.   MOTOR: Motor strength is 5/5 throughout, trace bilateral hand tremor when arms outstretching, none at rest and no worsening with finger to nose testing.   MSRs: Reflexes are 2+/4 throughout.  SENSORY:  Intact to vibration and temperature throughout  COORDINATION/GAIT:   Gait narrow based, slow, slightly unsteady, unassisted.   Data: [facility], Dr. 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  MRI brain wwo contrast 05/18/2015: Stable MRI appearance of the brain since 2016 with  moderate for age nonspecific white matter signal changes. No acute intracranial abnormality.  MRV 06/30/2015:  No evidence of venous sinus thrombosis. No focal narrowing of the  transverse-sigmoid sinus junction, as can be seen in the setting of idiopathic intracranial hypotension.  MRI cervical spine wo contrast 06/07/2015: 1. Small right paracentral/foraminal disc protrusion at T2-3, closely approximating the exiting right T2 nerve root. This could potentially produce right-sided radicular symptoms. 2. Mild right foraminal narrowing at C4-5 and C5-6 related to uncovertebral hypertrophy. No other significant right-sided foraminal narrowing within the cervical spine. 3. Small central disc protrusion at C6-7 without significant stenosis. 4. Additional tiny central disc protrusion at C2-3 without stenosis. 5. Additional minimal multilevel degenerative changes as above.  CSF 09/18/2011: R269 W0 G65 P17, CSF cytology-neg OP 43, 32cc removed  LP 02/05/2013: OP 18, CP 17 after 21 cc removed  LP 10/25/2013:  OP 26, CP 5 after 15 cc removed LP 07/29/2014:  OP 27, CP 16 after 18cc removed LP 02/07/2015:  OP 27, CP 12 after 22cc removed LP 05/23/2015:  OP 25, CP 11 after 25cc removed LP 10/19/2015:  OP 26.5, CP 10 after 32cc removed  Routine EEG 06/28/2014:  Normal  CT head 03/04/2016:  No acute intracranial abnormalities. Stable appearance of the shunted ventricular system.  IMPRESSION/PLAN: 1.  Painful chemotherapy-induced neuropathy  - I have refilled Cymbalta 90mg   - She was seeing pain management in the past and I encouraged her to establish care again with them for chronic pain management  2.  Benign essential tremor, stable  - Continue primidone 25mg  at bedtime  3.  Pseudotomor cerebri with left visual field loss and R eye blindness s/p VP shunt at [facility].   - She denies headache, but reports mild worsening vision in the left eye  - I urged her to follow-up with her ophthalmologist  - Continue  Diamox 1g TID  Return to clinic in 1 year  Thank you for allowing me to participate in patient's care.  If I can answer any additional questions, I would be pleased to do so.    Sincerely,    Donika K. Posey Pronto, DO

## 2019-07-21 ENCOUNTER — Other Ambulatory Visit: Payer: Self-pay

## 2019-07-21 ENCOUNTER — Inpatient Hospital Stay: Payer: Medicare Other | Attending: Oncology

## 2019-07-21 VITALS — BP 168/78 | HR 88 | Temp 98.2°F | Resp 18

## 2019-07-21 DIAGNOSIS — Z79818 Long term (current) use of other agents affecting estrogen receptors and estrogen levels: Secondary | ICD-10-CM | POA: Insufficient documentation

## 2019-07-21 DIAGNOSIS — Z171 Estrogen receptor negative status [ER-]: Secondary | ICD-10-CM | POA: Diagnosis not present

## 2019-07-21 DIAGNOSIS — C50412 Malignant neoplasm of upper-outer quadrant of left female breast: Secondary | ICD-10-CM | POA: Diagnosis present

## 2019-07-21 MED ORDER — GOSERELIN ACETATE 3.6 MG ~~LOC~~ IMPL
3.6000 mg | DRUG_IMPLANT | SUBCUTANEOUS | Status: DC
  Administered 2019-07-21: 3.6 mg via SUBCUTANEOUS

## 2019-07-22 MED ORDER — LIDOCAINE HCL 2 % IJ SOLN
INTRAMUSCULAR | Status: AC
  Filled 2019-07-22: qty 20

## 2019-07-28 ENCOUNTER — Telehealth (INDEPENDENT_AMBULATORY_CARE_PROVIDER_SITE_OTHER): Payer: Medicare Other | Admitting: Cardiology

## 2019-07-28 ENCOUNTER — Other Ambulatory Visit: Payer: Self-pay | Admitting: Internal Medicine

## 2019-07-28 ENCOUNTER — Other Ambulatory Visit: Payer: Self-pay

## 2019-07-28 ENCOUNTER — Encounter: Payer: Self-pay | Admitting: Cardiology

## 2019-07-28 VITALS — BP 167/94 | HR 102 | Ht 66.0 in | Wt 205.0 lb

## 2019-07-28 DIAGNOSIS — I10 Essential (primary) hypertension: Secondary | ICD-10-CM | POA: Diagnosis not present

## 2019-07-28 DIAGNOSIS — E669 Obesity, unspecified: Secondary | ICD-10-CM | POA: Diagnosis not present

## 2019-07-28 DIAGNOSIS — G4733 Obstructive sleep apnea (adult) (pediatric): Secondary | ICD-10-CM | POA: Diagnosis not present

## 2019-07-28 NOTE — Progress Notes (Signed)
Virtual Visit via Telephone Note   This visit type was conducted due to national recommendations for restrictions regarding the COVID-19 Pandemic (e.g. social distancing) in an effort to limit this patient's exposure and mitigate transmission in our community.  Due to her co-morbid illnesses, this patient is at least at moderate risk for complications without adequate follow up.  This format is felt to be most appropriate for this patient at this time.  The patient did not have access to video technology/had technical difficulties with video requiring transitioning to audio format only (telephone).  All issues noted in this document were discussed and addressed.  No physical exam could be performed with this format.  Please refer to the patient's chart for her  consent to telehealth for [facility].   Evaluation Performed:  Follow-up visit  This visit type was conducted due to national recommendations for restrictions regarding the COVID-19 Pandemic (e.g. social distancing).  This format is felt to be most appropriate for this patient at this time.  All issues noted in this document were discussed and addressed.  No physical exam was performed (except for noted visual exam findings with Video Visits).  Please refer to the patient's chart (MyChart message for video visits and phone note for telephone visits) for the patient's consent to telehealth for [facility].  Date:  07/28/2019   ID:  Erica [name], DOB 01-31-1976, MRN [id number]  Patient Location:  Home  Provider location:   [place]  PCP:  [name], MD  Cardiologist:  Ena Dawley, MD  Sleep Medicine:  Fransico Him, MD Electrophysiologist:  None   Chief Complaint:  OSA  History of Present Illness:    Erica [name] is a 44 y.o. female who presents via audio/video conferencing for a telehealth visit today.    Erica [name] is a 44 y.o. female with a hx of severe OSA with an AHI of 31/hr mainly in NREM supine  sleep with successful CPAP titration to 12cm H2O in 2016.  At her last OV she was complaining that she was not getting enough air and waking up with a lot of mucus which was resulting in noncompliance with her device.  She was sent back to the sleep lab for a BiPAP titration which she did not have until a year later but was titrated to 17/13cm H2O.    She is doing well with her BiPAP device and thinks that she has gotten used to it.  She tolerates the mask for the most part and feels the pressure is better and she is getting more air.  Since going on CPAP she feels more rested in the am but still has some daytime sleepiness.  She has problems with mouth dryness.  She does not think that she snores.    The patient does not have symptoms concerning for COVID-19 infection (fever, chills, cough, or new shortness of breath).    Prior CV studies:   The following studies were reviewed today:  BiPAP titration and PAP download  Past Medical History:  Diagnosis Date  . Allergy   . Anxiety   . Arthritis   . Asthma   . Blood transfusion without reported diagnosis   . Breast cancer ([place]) 2010   a. locally advanced left breast carcinoma diagnosed in 2010 and treated with neoadjuvant chemotherapy with docetaxel and cyclophosphamide as well as paclitaxel. This was followed by radiation therapy which was completed in 2011.  Marland Kitchen CHF (congestive heart failure) ([place])   .  Chronic kidney disease   . Dyspnea on exertion    a. 01/2013 Lexi MV: EF 54%, no ischemia/infarct;  b. 05/2013 Echo: EF 60-65%, no rwma, Gr 2 DD;  b.   . Fatty liver   . Gastroparesis   . GERD (gastroesophageal reflux disease)   . Hypertension   . Impaired glucose tolerance 04/13/2011  . Lymphadenitis, chronic    restricted LEFT extremity  . 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[place]) 09/27/2010  . OSA (obstructive sleep apnea) 05/04/2014   AHI 31/hr now on CPAP at 12cm H2O  . Oxygen deficiency    Uses 02 in CPAP  . Personal history of chemotherapy 2010    . Personal history of radiation therapy 2010  . Sleep apnea    Past Surgical History:  Procedure Laterality Date  . BRAIN SURGERY     [place]  . BREAST LUMPECTOMY Left 09/2008  . CARPAL TUNNEL RELEASE Left 2011  . LYMPH NODE DISSECTION Left 2010   breast; 2 wks after breast lumpectomy  . RIGHT/LEFT HEART CATH AND CORONARY ANGIOGRAPHY N/A 05/04/2019   Procedure: RIGHT/LEFT HEART CATH AND CORONARY ANGIOGRAPHY;  Surgeon: Jolaine Artist, MD;  Location: [facility];  Service: Cardiovascular;  Laterality: N/A;  Renal injection preformed   . TENDON RELEASE Right 2012   "hand"  . TUBAL LIGATION  05/11/2011   Procedure: POST PARTUM TUBAL LIGATION;  Surgeon: Emeterio Reeve, MD;  Location: [facility];  Service: Gynecology;  Laterality: Bilateral;  Induced for HTN     Current Meds  Medication Sig  . acetaminophen-codeine (TYLENOL #4) 300-60 MG tablet Take 1 tablet by mouth every 6 (six) hours as needed for pain.  Marland Kitchen acetaZOLAMIDE (DIAMOX) 250 MG tablet Take 4 tablets (1,000 mg total) by mouth 3 (three) times daily.  Marland Kitchen albuterol (PROVENTIL) (2.5 MG/3ML) 0.083% nebulizer solution USE 1 VIAL VIA NEBULIZER EVERY 4 HOURS AND AS NEEDED FOR WHEEZING OR SHORTNESS OF BREATH  . albuterol (VENTOLIN HFA) 108 (90 Base) MCG/ACT inhaler INHALE 2 PUFFS BY MOUTH EVERY 4 HOURS AS NEEDED FOR WHEEZE OR FOR SHORTNESS OF BREATH  . amitriptyline (ELAVIL) 100 MG tablet TAKE 1 TABLET BY MOUTH EVERYDAY AT BEDTIME  . amLODipine (NORVASC) 10 MG tablet Take 1 tablet (10 mg total) by mouth daily. Take 1/2 tablet daily for first 10 days then increase to 1 tablet daily  . Biotin 5000 MCG TABS Take 5,000 mg by mouth 2 (two) times daily.   . Cholecalciferol 1.25 MG (50000 UT) capsule Take 1 capsule (50,000 Units total) by mouth once a week.  . cloNIDine (CATAPRES - DOSED IN MG/24 HR) 0.3 mg/24hr patch PLACE 1 PATCH ONTO SKIN ONCE WEEKLY  . clopidogrel (PLAVIX) 75 MG tablet Take 1 tablet (75 mg total) by mouth daily.  .  clotrimazole (MYCELEX) 10 MG troche Take 1 tablet (10 mg total) by mouth 5 (five) times daily.  Marland Kitchen dexlansoprazole (DEXILANT) 60 MG capsule Take 1 capsule (60 mg total) by mouth daily.  Marland Kitchen dextromethorphan-guaiFENesin (MUCINEX DM) 30-600 MG 12hr tablet Take 2 tablets by mouth 2 (two) times daily.   . Diclofenac Sodium 2 % SOLN Place 2 g onto the skin 2 (two) times daily.  Marland Kitchen doxazosin (CARDURA) 8 MG tablet Take 1 tablet (8 mg total) by mouth daily.  . DULoxetine (CYMBALTA) 30 MG capsule Take 1 capsule (30 mg total) by mouth daily. With 60mg  for total of 90 mg  . DULoxetine (CYMBALTA) 60 MG capsule Take 1 capsule (60 mg total) by mouth  daily. For total of 90 mg  . EPINEPHRINE 0.3 mg/0.3 mL IJ SOAJ injection INJECT 0.3 MLS INTO THE MUSCLE ONCE AS NEEDED FOR ANAPHYLAXIS  . ezetimibe (ZETIA) 10 MG tablet Take 1 tablet (10 mg total) by mouth daily.  . famotidine (PEPCID) 20 MG tablet Take 1-2 tablets (20-40 mg total) by mouth at bedtime.  . hydrALAZINE (APRESOLINE) 100 MG tablet TAKE 1 TABLET (100 MG TOTAL) BY MOUTH 3 (THREE) TIMES DAILY.  Marland Kitchen lidocaine-prilocaine (EMLA) cream Apply 1 application topically every 28 (twenty-eight) days. Apply 1 application to skin ever 28 days  . metoCLOPramide (REGLAN) 10 MG tablet Take 1 tablet (10 mg total) by mouth 4 (four) times daily.  . montelukast (SINGULAIR) 10 MG tablet Take 1 tablet (10 mg total) by mouth at bedtime.  Marland Kitchen NITROSTAT 0.4 MG SL tablet PLACE 1 TABLET UNDER TONGUE EVERY 5 MINUTES AS NEEDED FOR CHEST PAIN, MAX OF 3 TABLETS THEN CALL 911 IF PAIN PERSISTS  . oxymetazoline (MUCINEX SINUS-MAX FULL FORCE) 0.05 % nasal spray Place 1 spray into both nostrils 2 (two) times daily.   . Potassium Chloride ER 20 MEQ TBCR TAKE 1 TABLET BY MOUTH TWICE A DAY  . primidone (MYSOLINE) 50 MG tablet Take 0.5 tablets (25 mg total) by mouth at bedtime.  . promethazine (PHENERGAN) 25 MG suppository Place 25 mg rectally every 6 (six) hours as needed for nausea or vomiting.  .  promethazine (PHENERGAN) 25 MG tablet Take 1 tablet (25 mg total) by mouth 3 (three) times daily as needed for nausea or vomiting.  . ranolazine (RANEXA) 500 MG 12 hr tablet TAKE 1 TABLET BY MOUTH TWICE A DAY  . Respiratory Therapy Supplies (FLUTTER) DEVI Use as directed  . rosuvastatin (CRESTOR) 40 MG tablet Take 1 tablet (40 mg total) by mouth daily.  Marland Kitchen Spacer/Aero-Holding Chambers (AEROCHAMBER MV) inhaler Use as instructed  . spironolactone (ALDACTONE) 100 MG tablet TAKE 1 TABLET BY MOUTH EVERY DAY  . SYMBICORT 160-4.5 MCG/ACT inhaler INHALE 2 PUFFS INTO LUNGS 2 TIMES DAILY AS DIRECTED  . torsemide (DEMADEX) 20 MG tablet Take 2 tablets (40 mg total) by mouth daily.  . valsartan (DIOVAN) 320 MG tablet TAKE 1 TABLET BY MOUTH EVERY DAY     Allergies:   Aspirin, Doxycycline, Kiwi extract, Metoprolol, Penicillins, Strawberry extract, and Sulfamethoxazole-trimethoprim   Social History   Tobacco Use  . Smoking status: Never Smoker  . Smokeless tobacco: Never Used  Substance Use Topics  . Alcohol use: Yes    Alcohol/week: 0.0 standard drinks    Comment: occasionally/socially  . Drug use: No     Family Hx: The patient's family history includes Breast cancer in some other family members; CAD in her maternal grandmother; Diabetes in her maternal grandmother; Fibroids (age of onset: 27) in her sister; Heart disease in her maternal grandmother; Hypertension in her maternal grandmother; Other (age of onset: 26) in her mother. There is no history of Cancer, Alcohol abuse, Early death, Hyperlipidemia, Kidney disease, Stroke, Colon cancer, Esophageal cancer, Stomach cancer, or Rectal cancer.  ROS:   Please see the history of present illness.     All other systems reviewed and are negative.   Labs/Other Tests and Data Reviewed:    Recent Labs: 04/30/2019: BUN 9; Creatinine, Ser 1.26; NT-Pro BNP 346; Platelets 216; TSH 1.470 05/04/2019: Hemoglobin 12.6; Hemoglobin 12.9; Potassium 3.1; Potassium  3.3; Sodium 144; Sodium 143 06/04/2019: ALT 23   Recent Lipid Panel Lab Results  Component Value Date/Time   CHOL 176  06/04/2019 08:25 AM   TRIG 86 06/04/2019 08:25 AM   HDL 42 06/04/2019 08:25 AM   CHOLHDL 4.2 06/04/2019 08:25 AM   CHOLHDL 4 10/01/2018 11:15 AM   LDLCALC 118 (H) 06/04/2019 08:25 AM   LDLDIRECT 152.0 03/30/2018 04:04 PM    Wt Readings from Last 3 Encounters:  07/28/19 205 lb (93 kg)  07/19/19 207 lb (93.9 kg)  07/07/19 206 lb (93.4 kg)     Objective:    Vital Signs:  BP (!) 167/94   Pulse (!) 102   Ht 5\' 6"  (1.676 m)   Wt 205 lb (93 kg)   BMI 33.09 kg/m     ASSESSMENT & PLAN:    1.  OSA -The patient is tolerating PAP therapy well without any problems.  The patient has been using and benefiting from PAP use and will continue to benefit from therapy. I will get a download from her DME.  She has an overnight pulse ox order pending to see if she has any residual nocturnal hypoxemia with the BiPAP.  2.  HTN -BP elevated  on exam -continue amlodipine 10mg  daily, Clonidine patch, Doxazosin 8mg  daily, Hydralazine 100mg  TID -she has not taken her Hydralazine yet today -she is being followed by Dr. Oval Linsey for her HTN  3.  Obesity -I have encouraged her to get into a routine exercise program and cut back on carbs and portions.     COVID-19 Education: The signs and symptoms of COVID-19 were discussed with the patient and how to seek care for testing (follow up with PCP or arrange E-visit).  The importance of social distancing was discussed today.  Patient Risk:   After full review of this patient's clinical status, I feel that they are at least moderate risk at this time.  Time:   Today, I have spent 20 minutes on telemedicine discussing medical problems including OSA, HTN, obesity and reviewing patient's chart including BiPAP titration and PAP compliance download.  Medication Adjustments/Labs and Tests Ordered: Current medicines are reviewed at length  with the patient today.  Concerns regarding medicines are outlined above.  Tests Ordered: No orders of the defined types were placed in this encounter.  Medication Changes: No orders of the defined types were placed in this encounter.   Disposition:  Follow up in 3 months  Signed, Fransico Him, MD  07/28/2019 8:04 AM    [place]

## 2019-07-28 NOTE — Patient Instructions (Signed)
Medication Instructions:  Your physician recommends that you continue on your current medications as directed. Please refer to the Current Medication list given to you today.  *If you need a refill on your cardiac medications before your next appointment, please call your pharmacy*  Follow-Up: At [facility], you and your health needs are our priority.  As part of our continuing mission to provide you with exceptional heart care, we have created designated Provider Care Teams.  These Care Teams include your primary Cardiologist (physician) and Advanced Practice Providers (APPs -  Physician Assistants and Nurse Practitioners) who all work together to provide you with the care you need, when you need it.  Your next appointment:   3 month(s)  The format for your next appointment:   Virtual Visit   Provider:   Fransico Him, MD

## 2019-07-29 ENCOUNTER — Other Ambulatory Visit: Payer: Self-pay | Admitting: Cardiology

## 2019-07-29 DIAGNOSIS — R079 Chest pain, unspecified: Secondary | ICD-10-CM

## 2019-07-29 DIAGNOSIS — I110 Hypertensive heart disease with heart failure: Secondary | ICD-10-CM

## 2019-08-02 ENCOUNTER — Telehealth: Payer: Self-pay | Admitting: *Deleted

## 2019-08-02 NOTE — Telephone Encounter (Signed)
----- Message from [name], MD sent at 08/01/2019  7:36 AM EDT ----- Regarding: RE: BIPAP D/L AHI is good but needs to be more compliant  Erica [name] ----- Message ----- From: Freada Bergeron, CMA Sent: 07/30/2019  11:18 AM EDT To: [name], MD Subject: RE: BIPAP D/L                                  Compliance Summary 06/29/2019 - 07/28/2019 (30 days) Days with Device Usage 28 days Days without Device Usage 2 days Percent Days with Device Usage 93.3% Cumulative Usage 5 days 2 hrs. 22 mins. 22 secs. Maximum Usage (1 Day) 7 hrs. 45 mins. 49 secs. Average Usage (All Days) 4 hrs. 4 mins. 44 secs. Average Usage (Days Used) 4 hrs. 22 mins. 13 secs. Minimum Usage (1 Day) 1 mins. Percent of Days with Usage >= 4 Hours 50.0% Percent of Days with Usage < 4 Hours 50.0% Date Range Total Blower Time 20 days 23 hrs. 46 mins. 51 secs. Auto Bi-Level Summary Maximum Titrated IPAP 17.0 cmH2O Device IPAP <= 90% of Time 15.8 cmH2O Maximum Titrated EPAP 17.0 cmH2O Device EPAP <= 90% of Time 15.3 cmH2O Average Time in Large Leak Per Day 19 mins. 32 secs. Average AHI 5.2 ----- Message ----- From: [name], MD Sent: 07/30/2019  10:46 AM EDT To: Freada Bergeron, CMA Subject: RE: BIPAP D/L                                  [name] do not see the AHI on here ----- Message ----- From: Freada Bergeron, CMA Sent: 07/29/2019   6:10 PM EDT To: [name], MD Subject: BIPAP D/L                                      [name] Summary Report Patient ID: [id number] Date of Birth: 21-Aug-1975 Setup Date: 06/21/2019 Date Range: Device: Mask: 06/29/2019 - 07/28/2019 DreamStation Auto BiPAP [id number] Patient Reference: [place], [place] [id number] Location: [facility] [place], AM - M... [place] Phone Number: [phone] Address: Phone Number: Linked App: Care Team [place], [address] [place] [address]  [phone] Fransico Him [facility] [address] [place], [place] [address] [phone] Compliance Information 06/29/2019 - 07/28/2019 Compliance Summary 06/29/2019 - 07/28/2019 (30 days) Days with Device Usage 28 days Days without Device Usage 2 days Percent Days with Device Usage 93.3% Cumulative Usage 5 days 2 hrs. 22 mins. 22 secs. Maximum Usage (1 Day) 7 hrs. 45 mins. 49 secs. Average Usage (All Days) 4 hrs. 4 mins. 44 secs. Average Usage (Days Used) 4 hrs. 22 mins. 13 secs. Minimum Usage (1 Day) 1 mins. Percent of Days with Usage >= 4 Hours 50.0% Percent of Days with Usage < 4 Hours 50.0% Date Range Total Blower Time 20 days 23 hrs. 46 mins. 51 secs. Auto Bi-Level Summary Maximum Titrated IPAP 17.0 cmH2O Device IPAP <= 90% of Time 15.8 cmH2O Maximum Titrated EPAP 17.0 cmH2O Device EPAP <= 90% of Time 15.3 cmH2O Average Time in Large Leak Per Day 19 mins. 32 secs. Average AH ----- Message ----- From: [name], MD Sent: 07/28/2019   8:14 AM EDT To: Jaclyn Prime  Hubert Azure, CMA  Please get a copy of new download on BiPAP.

## 2019-08-02 NOTE — Telephone Encounter (Signed)
Patient is aware and agreeable to AHI being within range at 5.2. Patient is aware and agreeable to improving in compliance with machine usage. Patient is aware and agreeable to no change in current pressures.

## 2019-08-03 ENCOUNTER — Telehealth: Payer: Self-pay | Admitting: *Deleted

## 2019-08-03 NOTE — Telephone Encounter (Signed)
Patient is aware and agreeable to AHI being within range at 5.2. Patient is aware and agreeable to improving in compliance with machine usage. Patient is aware and agreeable to no change in current pressures.

## 2019-08-03 NOTE — Telephone Encounter (Signed)
-----   Message from [name], MD sent at 08/02/2019  3:49 PM EDT ----- Good AHI on PAP but needs to improve compliance

## 2019-08-05 ENCOUNTER — Ambulatory Visit (INDEPENDENT_AMBULATORY_CARE_PROVIDER_SITE_OTHER): Payer: Medicare Other | Admitting: Pharmacist Clinician (PhC)/ Clinical Pharmacy Specialist

## 2019-08-05 ENCOUNTER — Other Ambulatory Visit: Payer: Self-pay

## 2019-08-05 DIAGNOSIS — I10 Essential (primary) hypertension: Secondary | ICD-10-CM

## 2019-08-05 NOTE — Patient Instructions (Signed)
Return for a a follow up appointment with Dr. Oval Linsey in 1-2 months  Your blood pressure today is 200/100  Check your blood pressure at home daily and keep record of the readings.  Take your BP meds as follows:  Continue with all current medications  Bring all of your meds, your BP cuff and your record of home blood pressures to your next appointment.  Exercise as you're able, try to walk approximately 30 minutes per day.  Keep salt intake to a minimum, especially watch canned and prepared boxed foods.  Eat more fresh fruits and vegetables and fewer canned items.  Avoid eating in fast food restaurants.    HOW TO TAKE YOUR BLOOD PRESSURE: . Rest 5 minutes before taking your blood pressure. .  Don't smoke or drink caffeinated beverages for at least 30 minutes before. . Take your blood pressure before (not after) you eat. . Sit comfortably with your back supported and both feet on the floor (don't cross your legs). . Elevate your arm to heart level on a table or a desk. . Use the proper sized cuff. It should fit smoothly and snugly around your bare upper arm. There should be enough room to slip a fingertip under the cuff. The bottom edge of the cuff should be 1 inch above the crease of the elbow. . Ideally, take 3 measurements at one sitting and record the average.

## 2019-08-05 NOTE — Progress Notes (Signed)
08/05/2019 Erica [name] Jun 22, 1975 [id number]   HPI:  Erica [name] is a 44 y.o. female patient of Dr Oval Linsey, with a [place] below who presents today for hypertension clinic follow up.  She was seen by Dr. Oval Linsey in early February and found to have a BP of 207/98.  At that time she was on 6 medications, including clonidine 0.3 mg patch, diltiazem 420 mg, hydralazine 123XX123 mg tid, Bystolic 40 mg, spironolactone 100 mg and valsartan 320 mg, all at maximum doses.  Dr. Blenda Mounts nurse called her pharmacy to check compliance and the following was noted:  Clonidine patch - last filled 90 days in October 2020  Hydralazine 100 mg - last filled 90 days in January 2021  Torsemide and valsartan last filled for 90 days in July 2020   As of Feb 5 she had never filled the spironolactone or Bystolic  Diltiazem had not been filled since Dec 2019  When confronted by Dr. Oval Linsey, pt stated that her pulmonologist always gives samples of Bystolic, and that otherwise the pharmacy is incorrect with its data.  (of note since being prescribed rosuvastatin  In 2019, her LDL has remained essentially unchanged).  At that visit doxazosin 4 mg daily was added.    Patients labs have almost always indicated a low potassium level despite being on 100 mg of spironolactone and 320 mg valsartan.  Spoke with pharmacist at [facility] in [place] to review compliance.  He notes that she picked up clonidine patches, valsartan and torsemide in February.  The diltiazem 420 mg was last filled in December 2019.  She did also pick up the doxazosin 4 mg in February and told him she would be back next week to get the higher strength, as the store was out of stock and she still had some 4 mg tablets.   Pharmacist at [facility] in [place] offered to have a discussion with her about compliance as he felt he knew her well enough to confront her.  At her next visit she admitted to not having taken the diltiazem - thought she had been told to stop.  We then  started her on amlodipine 5 mg daily for 10 days then increase to 10 mg daily.    Today she returns for her third follow up.  We called her this morning to remind her to bring home BP readings and she brings in a list of 22 readings, all in the same pencil, with no dates.  States she has occasional LEE in afternoons, always diminishes overnight.  She notes that she wore a pulse ox last week and is wondering if Dr. Radford Pax has those results.  States she gets short of breath even going up 5 steps in house.     Past Medical History: CHF Chronic diastolic EF  asthma Followed by Dr. 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    Potential secondary causes of hypertension:  Renal artery stenosis - renal dopplers 09/2018 showed 1-59% stenosis on both left and right  Hyperaldosteronism - renin and aldosterone WNL 04/2019  Hyper/hypo thyroidism - TSH WNL   Sleep apnea - now using BiPAP (about 4 hrs per night)  Herbals/drugs - went thru extensive list, pt denies all except caffeine  Blood Pressure Goal:  130/80  Current Medications:  Bystolic 40 mg qd, Clonidine 0.3 mg patch (QSunday), hydralazine 100 mg tid,  spironolactone 100 mg qd, valsartan 320 mg qd, amlodipine 10 mg  Family Hx: falther  deceased, no connection with family; no concerns with mother, currently 61; 1 sister (whole) smokes but no health issues; 4 children all healthy at  25,22,12,8;   Social Hx:  No tobacco, no alcohol; 32 oz Pepsi daily; caffeine relieves pressure from shunt (pseudotumor cerebri)  Diet:  Home cooked meals; likes vegetables (zuccini, squash, bro/caul, salad);  only canned vegetable is corn; occaional hamburer/sterakk, mostly chicken or seafood Eating only once daily at this point, dinner - last night had baked pork chop, baked potato and broccoli  Exercise: difficult to exercise d/t breathing issues  Home BP readings: no dates on log, 22 readings all in same pencil  -  Range 155-190/90-114; average 170/102  Heart rate range 123456 (on Bystolic 40 mg daily)  Intolerances: metoprolol caused hives  Labs:  1/29:  Na 142, K 3.6, Glu 103, BUN 9, SCr 1.26 GFR 59  Of note, potassium checked just a week later and found to be low at 3.3 (despite being on valsartan 320 and spironolactone 100)  Wt Readings from Last 3 Encounters:  08/05/19 205 lb (93 kg)  07/28/19 205 lb (93 kg)  07/19/19 207 lb (93.9 kg)   BP Readings from Last 3 Encounters:  08/05/19 (!) 200/100  07/28/19 (!) 167/94  07/21/19 (!) 168/78   Pulse Readings from Last 3 Encounters:  08/05/19 85  07/28/19 (!) 102  07/21/19 88    Current Outpatient Medications  Medication Sig Dispense Refill  . acetaminophen-codeine (TYLENOL #4) 300-60 MG tablet Take 1 tablet by mouth every 6 (six) hours as needed for pain. 30 tablet 0  . acetaZOLAMIDE (DIAMOX) 250 MG tablet Take 4 tablets (1,000 mg total) by mouth 3 (three) times daily. 1080 tablet 3  . albuterol (PROVENTIL) (2.5 MG/3ML) 0.083% nebulizer solution USE 1 VIAL VIA NEBULIZER EVERY 4 HOURS AND AS NEEDED FOR WHEEZING OR SHORTNESS OF BREATH 75 mL 1  . albuterol (VENTOLIN HFA) 108 (90 Base) MCG/ACT inhaler INHALE 2 PUFFS BY MOUTH EVERY 4 HOURS AS NEEDED FOR WHEEZE OR FOR SHORTNESS OF BREATH 18 Inhaler 1  . amitriptyline (ELAVIL) 100 MG tablet TAKE 1 TABLET BY MOUTH EVERYDAY AT BEDTIME 90 tablet 0  . amLODipine (NORVASC) 10 MG tablet Take 1 tablet (10 mg total) by mouth daily. Take 1/2 tablet daily for first 10 days then increase to 1 tablet daily 90 tablet 1  . Biotin 5000 MCG TABS Take 5,000 mg by mouth 2 (two) times daily.     . Cholecalciferol 1.25 MG (50000 UT) capsule Take 1 capsule (50,000 Units total) by mouth once a week. 12 capsule 1  . cloNIDine (CATAPRES - DOSED IN MG/24 HR) 0.3 mg/24hr patch PLACE 1 PATCH ONTO SKIN ONCE WEEKLY 12 patch 1  . clopidogrel (PLAVIX) 75 MG tablet Take 1 tablet (75 mg total) by mouth daily. 90 tablet 3  .  clotrimazole (MYCELEX) 10 MG troche Take 1 tablet (10 mg total) by mouth 5 (five) times daily. 30 tablet 11  . dexlansoprazole (DEXILANT) 60 MG capsule Take 1 capsule (60 mg total) by mouth daily. 90 capsule 3  . dextromethorphan-guaiFENesin (MUCINEX DM) 30-600 MG 12hr tablet Take 2 tablets by mouth 2 (two) times daily.     . Diclofenac Sodium 2 % SOLN Place 2 g onto the skin 2 (two) times daily. 112 g 3  . doxazosin (CARDURA) 8 MG tablet Take 1 tablet (8 mg total) by mouth daily. 90 tablet 3  . DULoxetine (CYMBALTA) 30 MG capsule Take 1 capsule (30 mg total)  by mouth daily. With 60mg  for total of 90 mg 90 capsule 3  . DULoxetine (CYMBALTA) 60 MG capsule Take 1 capsule (60 mg total) by mouth daily. For total of 90 mg 90 capsule 3  . EPINEPHRINE 0.3 mg/0.3 mL IJ SOAJ injection INJECT 0.3 MLS INTO THE MUSCLE ONCE AS NEEDED FOR ANAPHYLAXIS 2 Device 2  . ezetimibe (ZETIA) 10 MG tablet Take 1 tablet (10 mg total) by mouth daily. 90 tablet 1  . famotidine (PEPCID) 20 MG tablet Take 1-2 tablets (20-40 mg total) by mouth at bedtime. 180 tablet 1  . hydrALAZINE (APRESOLINE) 100 MG tablet TAKE 1 TABLET (100 MG TOTAL) BY MOUTH 3 (THREE) TIMES DAILY. 270 tablet 1  . lidocaine-prilocaine (EMLA) cream Apply 1 application topically every 28 (twenty-eight) days. Apply 1 application to skin ever 28 days    . metoCLOPramide (REGLAN) 10 MG tablet Take 1 tablet (10 mg total) by mouth 4 (four) times daily. 120 tablet 2  . montelukast (SINGULAIR) 10 MG tablet TAKE 1 TABLET BY MOUTH EVERYDAY AT BEDTIME 90 tablet 1  . NITROSTAT 0.4 MG SL tablet PLACE 1 TABLET UNDER TONGUE EVERY 5 MINUTES AS NEEDED FOR CHEST PAIN, MAX OF 3 TABLETS THEN CALL 911 IF PAIN PERSISTS 100 tablet 0  . oxymetazoline (MUCINEX SINUS-MAX FULL FORCE) 0.05 % nasal spray Place 1 spray into both nostrils 2 (two) times daily.     . Potassium Chloride ER 20 MEQ TBCR TAKE 1 TABLET BY MOUTH TWICE A DAY 180 tablet 0  . primidone (MYSOLINE) 50 MG tablet Take 0.5  tablets (25 mg total) by mouth at bedtime. 45 tablet 3  . promethazine (PHENERGAN) 25 MG suppository Place 25 mg rectally every 6 (six) hours as needed for nausea or vomiting.    . promethazine (PHENERGAN) 25 MG tablet Take 1 tablet (25 mg total) by mouth 3 (three) times daily as needed for nausea or vomiting. 270 tablet 1  . ranolazine (RANEXA) 500 MG 12 hr tablet TAKE 1 TABLET BY MOUTH TWICE A DAY 180 tablet 2  . Respiratory Therapy Supplies (FLUTTER) DEVI Use as directed 1 each 0  . rosuvastatin (CRESTOR) 40 MG tablet Take 1 tablet (40 mg total) by mouth daily. 90 tablet 2  . Spacer/Aero-Holding Chambers (AEROCHAMBER MV) inhaler Use as instructed 1 each 0  . SYMBICORT 160-4.5 MCG/ACT inhaler INHALE 2 PUFFS INTO LUNGS 2 TIMES DAILY AS DIRECTED 10.2 g 11  . torsemide (DEMADEX) 20 MG tablet Take 2 tablets (40 mg total) by mouth daily. 180 tablet 1  . valsartan (DIOVAN) 320 MG tablet TAKE 1 TABLET BY MOUTH EVERY DAY 90 tablet 3  . spironolactone (ALDACTONE) 100 MG tablet TAKE 1 TABLET BY MOUTH EVERY DAY 90 tablet 2   No current facility-administered medications for this visit.    Allergies  Allergen Reactions  . Aspirin Shortness Of Breath and Palpitations    Pt can take ibuprofen without reaction  . Doxycycline Anaphylaxis  . Kiwi Extract Itching and Swelling    Lip swelling  . Metoprolol Hives and Itching  . Penicillins Shortness Of Breath and Palpitations    Has patient had a PCN reaction causing immediate rash, facial/tongue/throat swelling, SOB or lightheadedness with hypotension: Yes Has patient had a PCN reaction causing severe rash involving mucus membranes or skin necrosis: Yes Has patient had a PCN reaction that required hospitalization Yes Has patient had a PCN reaction occurring within the last 10 years: No If all of the above answers are "NO", then  may proceed with Cephalosporin use.  . Strawberry Extract Hives  . Sulfamethoxazole-Trimethoprim Anaphylaxis    Facial, throat  and tongue swelling with difficulty swallowing.    Past Medical History:  Diagnosis Date  . Allergy   . Anxiety   . Arthritis   . Asthma   . Blood transfusion without reported diagnosis   . Breast cancer ([place]) 2010   a. locally advanced left breast carcinoma diagnosed in 2010 and treated with neoadjuvant chemotherapy with docetaxel and cyclophosphamide as well as paclitaxel. This was followed by radiation therapy which was completed in 2011.  Marland Kitchen CHF (congestive heart failure) ([place])   . Chronic kidney disease   . Dyspnea on exertion    a. 01/2013 Lexi MV: EF 54%, no ischemia/infarct;  b. 05/2013 Echo: EF 60-65%, no rwma, Gr 2 DD;  b.   . Fatty liver   . Gastroparesis   . GERD (gastroesophageal reflux disease)   . Hypertension   . Impaired glucose tolerance 04/13/2011  . Lymphadenitis, chronic    restricted LEFT extremity  . 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 OSA (obstructive sleep apnea) 05/04/2014   AHI 31/hr now on CPAP at 12cm H2O  . Oxygen deficiency    Uses 02 in CPAP  . Personal history of chemotherapy 2010  . Personal history of radiation therapy 2010  . Sleep apnea     Blood pressure (!) 200/100, pulse 85, temperature 98 F (36.7 C), resp. rate 15, height 5\' 6"  (1.676 m), weight 205 lb (93 kg), SpO2 96 %.  Essential hypertension Patient with uncontrolled hypertension, today at 200/100.  She states compliance with all medications, clonidine patch visible on her shoulder.  She is currently at maximum doses of valsartan, amlodipine, spironolactone, hydralazine, Bystolic and clonidine.  At this time I doubt the addition of chlorthalidone or minoxidil would have any bearing on her outcome.  Will have her continue with current medications and follow up with Dr. Oval Linsey in 4-6 weeks.    Tommy Medal PharmD CPP [facility] [address] [place], [place] [address] [phone]

## 2019-08-05 NOTE — Assessment & Plan Note (Signed)
Patient with uncontrolled hypertension, today at 200/100.  She states compliance with all medications, clonidine patch visible on her shoulder.  She is currently at maximum doses of valsartan, amlodipine, spironolactone, hydralazine, Bystolic and clonidine.  At this time I doubt the addition of chlorthalidone or minoxidil would have any bearing on her outcome.  Will have her continue with current medications and follow up with Dr. Oval Linsey in 4-6 weeks.

## 2019-08-06 ENCOUNTER — Other Ambulatory Visit: Payer: Self-pay | Admitting: Cardiology

## 2019-08-18 ENCOUNTER — Other Ambulatory Visit: Payer: Self-pay

## 2019-08-18 ENCOUNTER — Inpatient Hospital Stay: Payer: Medicare Other | Attending: Oncology

## 2019-08-18 VITALS — BP 164/78 | HR 78 | Temp 98.2°F | Resp 18

## 2019-08-18 DIAGNOSIS — C50412 Malignant neoplasm of upper-outer quadrant of left female breast: Secondary | ICD-10-CM | POA: Diagnosis present

## 2019-08-18 DIAGNOSIS — Z79818 Long term (current) use of other agents affecting estrogen receptors and estrogen levels: Secondary | ICD-10-CM | POA: Diagnosis present

## 2019-08-18 MED ORDER — GOSERELIN ACETATE 3.6 MG ~~LOC~~ IMPL
3.6000 mg | DRUG_IMPLANT | SUBCUTANEOUS | Status: DC
  Administered 2019-08-18: 3.6 mg via SUBCUTANEOUS
  Filled 2019-08-18: qty 3.6

## 2019-08-18 MED ORDER — GOSERELIN ACETATE 3.6 MG ~~LOC~~ IMPL
DRUG_IMPLANT | SUBCUTANEOUS | Status: AC
  Filled 2019-08-18: qty 3.6

## 2019-08-19 ENCOUNTER — Telehealth: Payer: Self-pay | Admitting: *Deleted

## 2019-08-19 DIAGNOSIS — G4733 Obstructive sleep apnea (adult) (pediatric): Secondary | ICD-10-CM

## 2019-08-19 NOTE — Telephone Encounter (Signed)
-----   Message from Antonieta Iba, RN sent at 08/16/2019  9:36 AM EDT ----- Patient has been notified of results - see MyChart message.

## 2019-08-19 NOTE — Telephone Encounter (Signed)
Order placed to [facility] via community message 

## 2019-08-25 ENCOUNTER — Other Ambulatory Visit: Payer: Self-pay

## 2019-08-26 ENCOUNTER — Other Ambulatory Visit: Payer: Self-pay | Admitting: Internal Medicine

## 2019-09-03 ENCOUNTER — Other Ambulatory Visit: Payer: Self-pay | Admitting: Adult Health

## 2019-09-06 ENCOUNTER — Ambulatory Visit (INDEPENDENT_AMBULATORY_CARE_PROVIDER_SITE_OTHER): Payer: Medicare Other | Admitting: Cardiovascular Disease

## 2019-09-06 ENCOUNTER — Encounter: Payer: Self-pay | Admitting: Cardiovascular Disease

## 2019-09-06 ENCOUNTER — Other Ambulatory Visit: Payer: Self-pay

## 2019-09-06 VITALS — BP 208/110 | HR 109 | Ht 66.0 in | Wt 204.0 lb

## 2019-09-06 DIAGNOSIS — I10 Essential (primary) hypertension: Secondary | ICD-10-CM | POA: Diagnosis not present

## 2019-09-06 DIAGNOSIS — I701 Atherosclerosis of renal artery: Secondary | ICD-10-CM

## 2019-09-06 DIAGNOSIS — I5032 Chronic diastolic (congestive) heart failure: Secondary | ICD-10-CM | POA: Diagnosis not present

## 2019-09-06 DIAGNOSIS — Z5181 Encounter for therapeutic drug level monitoring: Secondary | ICD-10-CM

## 2019-09-06 DIAGNOSIS — G4733 Obstructive sleep apnea (adult) (pediatric): Secondary | ICD-10-CM

## 2019-09-06 MED ORDER — OLMESARTAN-AMLODIPINE-HCTZ 40-10-25 MG PO TABS: 1.0000 | ORAL_TABLET | Freq: Every day | ORAL | 5 refills | Status: DC

## 2019-09-06 NOTE — Progress Notes (Signed)
Hypertension Clinic Follow up:    Date:  09/07/2019   ID:  Erica [name], DOB [birth year], MRN [id number]  PCP:  Erica Lima, MD  Cardiologist:  Erica Dawley, MD  Nephrologist:  Referring MD: Erica Lima, MD   CC: Hypertension  History of Present Illness:    Erica [name] is a 44 y.o. female with a hx of resistant hypertension, chronic diastolic heart failure, breast cancer s/p chemo and XRT, morbid obesity and OSA intolerant of CPAP here to establish care in the hypertension clinic. She reports that her BP has been elevated since she was treated for breast cancer in 2010.  Since then her blood pressure has never been very well have been controlled.  She has been on multiple antihypertensives and her blood pressure remains in the 937D to 428J systolic.  She struggles with exertional dyspnea that makes it hard for her to walk even short distances.  Echo 03/2018 revealed LVEF 45 to 50% with grade 2 diastolic dysfunction.  There was mild MR.  PASP was 33.  Erica [name] has a history of recurrent syncope.  She had an episode that occurred while wearing an ambulatory monitor and the episode was felt to be due to narcolepsy.  She has sleep apnea but is not tolerant of her CPAP machine.  She now uses her BIPAP machine but struggles with insomnia.  She wakes up hourly.  She underwent left and right heart catheterization on 05/04/2019.  She was found to have 60% distal left circumflex and 80% distal LAD disease.  Given her aspirin allergy Dr. Meda [name] recommended that she start clopidogrel.  Right atrial pressure was 10, and PA pressure was 26/10.  PCWP 18.  LVEDP was 23 mmHg.  At her first hypertension clinic visit there were concerns about whether she was taking her medication as prescribed.  Discrepancies between what was ordered and what she was actually picking up from the pharmacy.  She continues to report taking all medications as prescribed.  Her blood pressures continue to run quite elevated.   At her initial assessment doxazosin was added to her regimen.  This does not seem to have helped much.  She followed up with our pharmacist and was started on amlodipine.  She is unable to exercise due to poorly controlled blood pressure.  She has 1 caffeinated drink daily and does not use over-the-counter supplements or medications.  She continues to try to minimize her sodium intake.  Renal artery Dopplers revealed 1/5 59% stenosis bilaterally in 2019.  She followed up with vascular and there was some thought about doing abdominal CT to verify her renal artery patency but this never occurred.   Past Medical History:  Diagnosis Date  . Allergy   . Anxiety   . Arthritis   . Asthma   . Blood transfusion without reported diagnosis   . Breast cancer ([place]) 2010   a. locally advanced left breast carcinoma diagnosed in 2010 and treated with neoadjuvant chemotherapy with docetaxel and cyclophosphamide as well as paclitaxel. This was followed by radiation therapy which was completed in 2011.  Marland Kitchen CHF (congestive heart failure) ([place])   . Chronic kidney disease   . Dyspnea on exertion    a. 01/2013 Lexi MV: EF 54%, no ischemia/infarct;  b. 05/2013 Echo: EF 60-65%, no rwma, Gr 2 DD;  b.   . Fatty liver   . Gastroparesis   . GERD (gastroesophageal reflux disease)   . Hypertension   . Impaired glucose  tolerance 04/13/2011  . Lymphadenitis, chronic    restricted LEFT extremity  . 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 OSA (obstructive sleep apnea) 05/04/2014   AHI 31/hr now on CPAP at 12cm H2O  . Oxygen deficiency    Uses 02 in CPAP  . Personal history of chemotherapy 2010  . Personal history of radiation therapy 2010  . Sleep apnea     Past Surgical History:  Procedure Laterality Date  . BRAIN SURGERY     [place]  . BREAST LUMPECTOMY Left 09/2008  . CARPAL TUNNEL RELEASE Left 2011  . LYMPH NODE DISSECTION Left 2010   breast; 2 wks after breast lumpectomy  . RIGHT/LEFT HEART CATH AND CORONARY  ANGIOGRAPHY N/A 05/04/2019   Procedure: RIGHT/LEFT HEART CATH AND CORONARY ANGIOGRAPHY;  Surgeon: Jolaine Artist, MD;  Location: [facility];  Service: Cardiovascular;  Laterality: N/A;  Renal injection preformed   . TENDON RELEASE Right 2012   "hand"  . TUBAL LIGATION  05/11/2011   Procedure: POST PARTUM TUBAL LIGATION;  Surgeon: Emeterio Reeve, MD;  Location: [facility];  Service: Gynecology;  Laterality: Bilateral;  Induced for HTN    Current Medications: Current Meds  Medication Sig  . acetaminophen-codeine (TYLENOL #4) 300-60 MG tablet Take 1 tablet by mouth every 6 (six) hours as needed for pain.  Marland Kitchen acetaZOLAMIDE (DIAMOX) 250 MG tablet Take 4 tablets (1,000 mg total) by mouth 3 (three) times daily.  Marland Kitchen albuterol (PROVENTIL) (2.5 MG/3ML) 0.083% nebulizer solution USE 1 VIAL VIA NEBULIZER EVERY 4 HOURS AND AS NEEDED FOR WHEEZING OR SHORTNESS OF BREATH  . albuterol (VENTOLIN HFA) 108 (90 Base) MCG/ACT inhaler INHALE 2 PUFFS BY MOUTH EVERY 4 HOURS AS NEEDED FOR WHEEZE OR FOR SHORTNESS OF BREATH  . amitriptyline (ELAVIL) 100 MG tablet TAKE 1 TABLET BY MOUTH EVERYDAY AT BEDTIME  . Biotin 5000 MCG TABS Take 5,000 mg by mouth 2 (two) times daily.   . Cholecalciferol 1.25 MG (50000 UT) capsule Take 1 capsule (50,000 Units total) by mouth once a week.  . cloNIDine (CATAPRES - DOSED IN MG/24 HR) 0.3 mg/24hr patch PLACE 1 PATCH ONTO SKIN ONCE WEEKLY  . clopidogrel (PLAVIX) 75 MG tablet Take 1 tablet (75 mg total) by mouth daily.  . clotrimazole (MYCELEX) 10 MG troche Take 1 tablet (10 mg total) by mouth 5 (five) times daily.  Marland Kitchen dexlansoprazole (DEXILANT) 60 MG capsule Take 1 capsule (60 mg total) by mouth daily.  Marland Kitchen dextromethorphan-guaiFENesin (MUCINEX DM) 30-600 MG 12hr tablet Take 2 tablets by mouth 2 (two) times daily.   . Diclofenac Sodium 2 % SOLN Place 2 g onto the skin 2 (two) times daily.  Marland Kitchen doxazosin (CARDURA) 8 MG tablet Take 1 tablet (8 mg total) by mouth daily.  . DULoxetine  (CYMBALTA) 30 MG capsule Take 1 capsule (30 mg total) by mouth daily. With 60mg  for total of 90 mg  . DULoxetine (CYMBALTA) 60 MG capsule Take 1 capsule (60 mg total) by mouth daily. For total of 90 mg  . EPINEPHRINE 0.3 mg/0.3 mL IJ SOAJ injection INJECT 0.3 MLS INTO THE MUSCLE ONCE AS NEEDED FOR ANAPHYLAXIS  . ezetimibe (ZETIA) 10 MG tablet Take 1 tablet (10 mg total) by mouth daily.  . famotidine (PEPCID) 20 MG tablet TAKE 1-2 TABLETS (20-40 MG TOTAL) BY MOUTH AT BEDTIME.  . hydrALAZINE (APRESOLINE) 100 MG tablet TAKE 1 TABLET (100 MG TOTAL) BY MOUTH 3 (THREE) TIMES DAILY.  Marland Kitchen lidocaine-prilocaine (EMLA) cream Apply 1 application topically  every 28 (twenty-eight) days. Apply 1 application to skin ever 28 days  . metoCLOPramide (REGLAN) 10 MG tablet Take 1 tablet (10 mg total) by mouth 4 (four) times daily.  . montelukast (SINGULAIR) 10 MG tablet TAKE 1 TABLET BY MOUTH EVERYDAY AT BEDTIME  . Nebivolol HCl 20 MG TABS Take 40 mg by mouth daily.  Marland Kitchen NITROSTAT 0.4 MG SL tablet PLACE 1 TABLET UNDER TONGUE EVERY 5 MINUTES AS NEEDED FOR CHEST PAIN, MAX OF 3 TABLETS THEN CALL 911 IF PAIN PERSISTS  . oxymetazoline (MUCINEX SINUS-MAX FULL FORCE) 0.05 % nasal spray Place 1 spray into both nostrils 2 (two) times daily.   . Potassium Chloride ER 20 MEQ TBCR TAKE 1 TABLET BY MOUTH TWICE A DAY  . primidone (MYSOLINE) 50 MG tablet Take 0.5 tablets (25 mg total) by mouth at bedtime.  . promethazine (PHENERGAN) 25 MG suppository Place 25 mg rectally every 6 (six) hours as needed for nausea or vomiting.  . promethazine (PHENERGAN) 25 MG tablet Take 1 tablet (25 mg total) by mouth 3 (three) times daily as needed for nausea or vomiting.  . ranolazine (RANEXA) 500 MG 12 hr tablet TAKE 1 TABLET BY MOUTH TWICE A DAY  . Respiratory Therapy Supplies (FLUTTER) DEVI Use as directed  . rosuvastatin (CRESTOR) 40 MG tablet Take 1 tablet (40 mg total) by mouth daily.  Marland Kitchen Spacer/Aero-Holding Chambers (AEROCHAMBER MV) inhaler Use  as instructed  . spironolactone (ALDACTONE) 100 MG tablet TAKE 1 TABLET BY MOUTH EVERY DAY  . SYMBICORT 160-4.5 MCG/ACT inhaler INHALE 2 PUFFS INTO LUNGS 2 TIMES DAILY AS DIRECTED  . [DISCONTINUED] amLODipine (NORVASC) 10 MG tablet Take 1 tablet (10 mg total) by mouth daily. Take 1/2 tablet daily for first 10 days then increase to 1 tablet daily  . [DISCONTINUED] torsemide (DEMADEX) 20 MG tablet Take 2 tablets (40 mg total) by mouth daily. (Patient taking differently: Take 20 mg by mouth. )  . [DISCONTINUED] valsartan (DIOVAN) 320 MG tablet TAKE 1 TABLET BY MOUTH EVERY DAY     Allergies:   Aspirin, Doxycycline, Kiwi extract, Metoprolol, Penicillins, Strawberry extract, and Sulfamethoxazole-trimethoprim   Social History   Socioeconomic History  . Marital status: Single    Spouse name: Not on file  . Number of children: 3  . Years of education: Not on file  . Highest education level: Not on file  Occupational History    Employer: UNEMPLOYED  Tobacco Use  . Smoking status: Never Smoker  . Smokeless tobacco: Never Used  Substance and Sexual Activity  . Alcohol use: Yes    Alcohol/week: 0.0 standard drinks    Comment: occasionally/socially  . Drug use: No  . Sexual activity: Yes    Birth control/protection: Surgical  Other Topics Concern  . Not on file  Social History Narrative   She lives with four children.   She is currently not working (disbaility pending).   Left handed   One story home   Drinks caffeine      Social Determinants of Health   Financial Resource Strain:   . Difficulty of Paying Living Expenses:   Food Insecurity: Food Insecurity Present  . Worried About Charity fundraiser in the Last Year: Often true  . Ran Out of Food in the Last Year: Often true  Transportation Needs:   . Lack of Transportation (Medical):   Marland Kitchen Lack of Transportation (Non-Medical):   Physical Activity:   . Days of Exercise per Week:   . Minutes of Exercise per  Session:   Stress:     . Feeling of Stress :   Social Connections:   . Frequency of Communication with Friends and Family:   . Frequency of Social Gatherings with Friends and Family:   . Attends Religious Services:   . Active Member of Clubs or Organizations:   . Attends Archivist Meetings:   Marland Kitchen Marital Status:      Family History: The patient's family history includes Breast cancer in some other family members; CAD in her maternal grandmother; Diabetes in her maternal grandmother; Fibroids (age of onset: 50) in her sister; Heart disease in her maternal grandmother; Hypertension in her maternal grandmother; Other (age of onset: 59) in her mother. There is no history of Cancer, Alcohol abuse, Early death, Hyperlipidemia, Kidney disease, Stroke, Colon cancer, Esophageal cancer, Stomach cancer, or Rectal cancer.  ROS:   Please see the history of present illness.     All other systems reviewed and are negative.  EKGs/Labs/Other Studies Reviewed:    EKG:  EKG is not ordered today.  The ekg ordered 04/23/19 demonstrates sinus tachycardia.  Rate 124 bpm.  LVH  Recent Labs: 04/30/2019: BUN 9; Creatinine, Ser 1.26; NT-Pro BNP 346; Platelets 216; TSH 1.470 05/04/2019: Hemoglobin 12.6; Hemoglobin 12.9; Potassium 3.1; Potassium 3.3; Sodium 144; Sodium 143 06/04/2019: ALT 23   Recent Lipid Panel    Component Value Date/Time   CHOL 176 06/04/2019 0825   TRIG 86 06/04/2019 0825   HDL 42 06/04/2019 0825   CHOLHDL 4.2 06/04/2019 0825   CHOLHDL 4 10/01/2018 1115   VLDL 22.6 10/01/2018 1115   LDLCALC 118 (H) 06/04/2019 0825   LDLDIRECT 152.0 03/30/2018 1604    Echo 03/12/18: Study Conclusions   - Left ventricle: The cavity size was normal. Wall thickness was  increased in a pattern of moderate LVH. Systolic function was  mildly reduced. The estimated ejection fraction was in the range  of 45% to 50%. Wall motion was normal; there were no regional  wall motion abnormalities. Features are consistent  with a  pseudonormal left ventricular filling pattern, with concomitant  abnormal relaxation and increased filling pressure (grade 2  diastolic dysfunction).  - Mitral valve: There was mild regurgitation.  - Pulmonary arteries: Systolic pressure was mildly increased. PA  peak pressure: 33 mm Hg (S).   Lexiscan Myoview 04/20/18:   The left ventricular ejection fraction is moderately decreased (30-44%).  Nuclear stress EF: 41%.  There was no ST segment deviation noted during stress.  This is an intermediate risk study.   Abnormal, intermediate risk stress nuclear study with no ischemia or infarction; EF 41 with global hypokinesis and mild LVE; study intermediate risk due to reduced LV function.  Renal artery Doppler 10/29/18: Right: 1-59% stenosis of the right renal artery. Cyst(s) noted.  Left: 1-59% stenosis of the left renal artery.    LHC/RHC 05/04/19:  Dist LAD-1 lesion is 30% stenosed.  Dist LAD-2 lesion is 80% stenosed.  Dist Cx lesion is 60% stenosed.   Findings:  Ao = 173/104 (134) LV =  181/25 RA =  10 RV =  41/13 PA =  42/10 (26) PCW = 18 Fick cardiac output/index = 5.8/2.9 PVR = 1.4 wu Ao sat = 98% PA sat = 73%, 73%  Assessment: 1. 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 LVEF 55-60% 3. Mildly elevated filling pressures with mild pulmonary venous HTN 4. Widely patent bilateral renal arteries. No evidence of AAA 5. Severe systemic HTN    Physical  Exam:    VS:  BP (!) 208/110   Pulse (!) 109   Ht 5\' 6"  (1.676 m)   Wt 204 lb (92.5 kg)   SpO2 96%   BMI 32.93 kg/m     Wt Readings from Last 3 Encounters:  09/06/19 204 lb (92.5 kg)  08/05/19 205 lb (93 kg)  07/28/19 205 lb (93 kg)    VS:  BP (!) 208/110   Pulse (!) 109   Ht 5\' 6"  (1.676 m)   Wt 204 lb (92.5 kg)   SpO2 96%   BMI 32.93 kg/m  , BMI Body mass index is 32.93 kg/m. GENERAL:  Well appearing HEENT: Pupils equal round and reactive, fundi not visualized, oral mucosa  unremarkable NECK:  No jugular venous distention, waveform within normal limits, carotid upstroke brisk and symmetric, no bruits LUNGS:  Clear to auscultation bilaterally HEART:  RRR.  PMI not displaced or sustained,S1 and S2 within normal limits, no S3, no S4, no clicks, no rubs, no murmurs ABD:  Flat, positive bowel sounds normal in frequency in pitch, no bruits, no rebound, no guarding, no midline pulsatile mass, no hepatomegaly, no splenomegaly EXT:  2 plus pulses throughout, no edema, no cyanosis no clubbing SKIN:  No rashes no nodules NEURO:  Cranial nerves II through XII grossly intact, motor grossly intact throughout PSYCH:  Cognitively intact, oriented to person place and time   ASSESSMENT:    1. Essential hypertension   2. Therapeutic drug monitoring   3. Renal artery stenosis ([place])   4. Chronic diastolic CHF (congestive heart failure) ([place])   5. Obstructive sleep apnea syndrome     PLAN:    # Resistant hypertension:  Ms. Kniskern BP remains quite elevated.  We will try switching to Tribenzor (amlodipine 10mg , olmesartan 40mg , HCTZ 25mg ).  Reduce torsemide to 20mg  and check BMP in 1 week.  Continue metoprolol, clonidine hydralazine, and spironolactone.  Stop losartan and amlodipine.  Once her blood pressure is better controlled we will try to get her enrolled in the PREP program to start exercise.  If this fails, she may need consideration of renal artery denervation once it becomes available.  # OSA:  Continue BiPAP.  Explained to patient why we cannot start her on oxygen while walking.  She does not have any desaturations and in the long run this would not be beneficial.  She expressed understanding.   Disposition:    F/u with PharmD in 1 month x3 months.  Then f/u with me in 4 months.   Time spent: 35 minutes-Greater than 50% of this time was spent in counseling, explanation of diagnosis, planning of further management, and coordination of care.    Medication  Adjustments/Labs and Tests Ordered: Current medicines are reviewed at length with the patient today.  Concerns regarding medicines are outlined above.  Orders Placed This Encounter  Procedures  . CT ANGIO ABDOMEN W &/OR WO CONTRAST  . Basic metabolic panel   Meds ordered this encounter  Medications  . Olmesartan-amLODIPine-HCTZ 40-10-25 MG TABS    Sig: Take 1 tablet by mouth daily.    Dispense:  30 tablet    Refill:  5    D/C VALSARTAN AND AMLODIPINE     Signed, Skeet Latch, MD  09/07/2019 8:53 AM    [place]

## 2019-09-06 NOTE — Patient Instructions (Addendum)
Medication Instructions:  STOP AMLODIPINE   STOP VALSARTAN   START TRIBENZOR 40/10/25 MG DAILY   DECREASE YOUR TORSEMIDE TO 20 MG DAILY   Labwork: BMET IN 1 WEEK AFTER STARTING NEW MEDICATION    Testing/Procedures: CTA ABDOMEN AFTER YOUR BLOOD WORK NEXT WEEK   09/22/2019 AT 12:10 PM ARRIVE 15-20 MINUTES PRIO  [address]   NO FOOD FOR 4 HOURS PRIOR       Follow-Up:  10/07/2019 at 1:30 pm

## 2019-09-07 ENCOUNTER — Encounter: Payer: Self-pay | Admitting: Cardiovascular Disease

## 2019-09-08 ENCOUNTER — Telehealth: Payer: Self-pay

## 2019-09-08 DIAGNOSIS — Z789 Other specified health status: Secondary | ICD-10-CM

## 2019-09-08 NOTE — Telephone Encounter (Signed)
Called patient to follow up on need for housing resources. Patient is interested in finding section 8 housing due to her living conditions. Care Guide is working to see what community resources are possibly available for the patient to secure safe housing.  Will contact the patient by 09/13/19 with update on results from search.

## 2019-09-15 ENCOUNTER — Other Ambulatory Visit: Payer: Self-pay

## 2019-09-15 ENCOUNTER — Inpatient Hospital Stay: Payer: Medicare Other | Attending: Oncology

## 2019-09-15 VITALS — BP 158/78 | HR 98 | Temp 98.2°F | Resp 18

## 2019-09-15 DIAGNOSIS — C50412 Malignant neoplasm of upper-outer quadrant of left female breast: Secondary | ICD-10-CM | POA: Diagnosis not present

## 2019-09-15 DIAGNOSIS — Z79818 Long term (current) use of other agents affecting estrogen receptors and estrogen levels: Secondary | ICD-10-CM | POA: Diagnosis present

## 2019-09-15 MED ORDER — GOSERELIN ACETATE 3.6 MG ~~LOC~~ IMPL
DRUG_IMPLANT | SUBCUTANEOUS | Status: AC
  Filled 2019-09-15: qty 3.6

## 2019-09-15 MED ORDER — GOSERELIN ACETATE 3.6 MG ~~LOC~~ IMPL
3.6000 mg | DRUG_IMPLANT | SUBCUTANEOUS | Status: DC
  Administered 2019-09-15: 3.6 mg via SUBCUTANEOUS

## 2019-09-15 NOTE — Patient Instructions (Signed)

## 2019-09-20 ENCOUNTER — Telehealth: Payer: Self-pay | Admitting: *Deleted

## 2019-09-20 ENCOUNTER — Other Ambulatory Visit: Payer: Self-pay | Admitting: *Deleted

## 2019-09-20 ENCOUNTER — Other Ambulatory Visit: Payer: Medicare Other | Admitting: *Deleted

## 2019-09-20 ENCOUNTER — Other Ambulatory Visit: Payer: Self-pay

## 2019-09-20 DIAGNOSIS — I5032 Chronic diastolic (congestive) heart failure: Secondary | ICD-10-CM

## 2019-09-20 DIAGNOSIS — Z79899 Other long term (current) drug therapy: Secondary | ICD-10-CM

## 2019-09-20 NOTE — Telephone Encounter (Signed)
Spoke with Wells Guiles at [facility] regarding need for [employer] patient and she will be coming today or tomorrow morning for labs, late making medication changes  Advised Wells Guiles regarding labs to be done

## 2019-09-21 LAB — BASIC METABOLIC PANEL
BUN/Creatinine Ratio: 12 (ref 9–23)
BUN: 13 mg/dL (ref 6–24)
CO2: 28 mmol/L (ref 20–29)
Calcium: 9.3 mg/dL (ref 8.7–10.2)
Chloride: 101 mmol/L (ref 96–106)
Creatinine, Ser: 1.05 mg/dL — ABNORMAL HIGH (ref 0.57–1.00)
GFR calc Af Amer: 74 mL/min/{1.73_m2} (ref >59)
GFR calc non Af Amer: 64 mL/min/{1.73_m2} (ref >59)
Glucose: 110 mg/dL — ABNORMAL HIGH (ref 65–99)
Potassium: 3.3 mmol/L — ABNORMAL LOW (ref 3.5–5.2)
Sodium: 143 mmol/L (ref 134–144)

## 2019-09-22 ENCOUNTER — Ambulatory Visit
Admission: RE | Admit: 2019-09-22 | Discharge: 2019-09-22 | Disposition: A | Discharge status: Home / Self Care | Payer: Medicare Other | Source: Ambulatory Visit | Attending: Cardiovascular Disease | Admitting: Cardiovascular Disease

## 2019-09-22 DIAGNOSIS — I10 Essential (primary) hypertension: Secondary | ICD-10-CM

## 2019-09-22 DIAGNOSIS — I701 Atherosclerosis of renal artery: Secondary | ICD-10-CM

## 2019-09-22 MED ORDER — IOPAMIDOL (ISOVUE-370) INJECTION 76%
100.0000 mL | Freq: Once | INTRAVENOUS | Status: AC | PRN
  Administered 2019-09-22: 100 mL via INTRAVENOUS

## 2019-09-23 ENCOUNTER — Telehealth: Payer: Self-pay

## 2019-09-23 NOTE — Telephone Encounter (Signed)
Called patient to follow up on results of search for section 8 housing. Patient was informed that requirements were either 55 and older, checking back for availability in 3 months, or that waitlists were closed.   Patient asked for assistance with applying for Medicaid. Application was mailed to patient on 09/23/19.

## 2019-09-27 ENCOUNTER — Other Ambulatory Visit: Payer: Self-pay | Admitting: Internal Medicine

## 2019-09-30 ENCOUNTER — Other Ambulatory Visit: Payer: Self-pay | Admitting: Cardiology

## 2019-09-30 DIAGNOSIS — R072 Precordial pain: Secondary | ICD-10-CM

## 2019-09-30 DIAGNOSIS — I119 Hypertensive heart disease without heart failure: Secondary | ICD-10-CM

## 2019-10-06 ENCOUNTER — Ambulatory Visit: Payer: Medicare Other | Admitting: Internal Medicine

## 2019-10-07 ENCOUNTER — Other Ambulatory Visit: Payer: Self-pay

## 2019-10-07 ENCOUNTER — Other Ambulatory Visit: Payer: Self-pay | Admitting: Internal Medicine

## 2019-10-07 ENCOUNTER — Encounter: Payer: Self-pay | Admitting: Pharmacist

## 2019-10-07 ENCOUNTER — Ambulatory Visit (INDEPENDENT_AMBULATORY_CARE_PROVIDER_SITE_OTHER): Payer: Medicare Other | Admitting: Pharmacist

## 2019-10-07 ENCOUNTER — Other Ambulatory Visit: Payer: Self-pay | Admitting: Cardiology

## 2019-10-07 VITALS — BP 192/98 | HR 104 | Resp 17 | Ht 66.0 in | Wt 208.4 lb

## 2019-10-07 DIAGNOSIS — I5032 Chronic diastolic (congestive) heart failure: Secondary | ICD-10-CM

## 2019-10-07 DIAGNOSIS — I10 Essential (primary) hypertension: Secondary | ICD-10-CM | POA: Diagnosis not present

## 2019-10-07 MED ORDER — MINOXIDIL 2.5 MG PO TABS: 5.0000 mg | ORAL_TABLET | Freq: Every day | ORAL | 0 refills | Status: DC

## 2019-10-07 NOTE — Patient Instructions (Addendum)
It was nice meeting you today!  We would like your blood pressure to be less than 130/80  Please continue all your current medications  We are going to add minoxidil 2.5 mg (2 tablets) once a day  Please call with any questions   Karren Cobble, PharmD, [facility], [place] [address] [place], [place] [address] Phone: [phone]; Fax: [phone] 10/07/2019 2:58 PM

## 2019-10-07 NOTE — Progress Notes (Signed)
Patient ID: Erica [name]                 DOB: Sep 07, 1975                      MRN: [id number]     HPI: Erica [name] is a 44 y.o. female referred by Dr. Oval Linsey to HTN clinic. PMH is significant for diastolic heart failure, breast cancer, obesity, OSA, and syncope.    Patient presents today frustrated over health.  Reports is not sleeping well, only a few hours a night at most.  Having issues breathing despite albuterol inhaler and nebulizer and Symbicort inhaler for maintenance.  Has a hard time sleeping with BIPAP machine because it makes her claustrophobic.    Lives with two children but is trying to to work with Academic librarian for section 8 housing.  Has applied for Medicaid and hopes it will process by the end of the month.  Reports blood pressure readings at home are elevated at all times but did not bring logs or machine.    Her [employer] has offered her counseling services which she has been using and reports they have been helpful.  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  Has been listening to audio books, mostly the Bible.  However sometimes biblical stories have been giving her nightmares.    Current HTN meds: olmesartan/amlodipine/hctz 40/10/25 once daily, torsemide 20 mg, hydralazine 100mg  TID, clonidine patch 0.3 mg q 24h, doxasozin 8 mg daily, Bystolic 40 mg daily, spironolactone 100 mg  daily  BP goal: <130/80  Diet: Eats fruit, vegetables, chicken, fish.  Does not add salt to food  Allergies: Aspirin, Doxycycline, Kiwi extract, Metoprolol, Penicillins, Strawberry extract, and Sulfamethoxazole-trimethoprim    Wt Readings from Last 3 Encounters:  09/06/19 204 lb (92.5 kg)  08/05/19 205 lb (93 kg)  07/28/19 205 lb (93 kg)   BP Readings from Last 3 Encounters:  09/15/19 (!) 158/78  09/06/19 (!) 208/110  08/18/19 (!) 164/78   Pulse Readings from Last 3 Encounters:  09/15/19 98  09/06/19 (!) 109  08/18/19 78      Renal function: CrCl cannot be calculated (Unknown ideal weight.).  Past Medical History:  Diagnosis Date  . Allergy   . Anxiety   . Arthritis   . Asthma   . Blood transfusion without reported diagnosis   . Breast cancer ([place]) 2010   a. locally advanced left breast carcinoma diagnosed in 2010 and treated with neoadjuvant chemotherapy with docetaxel and cyclophosphamide as well as paclitaxel. This was followed by radiation therapy which was completed in 2011.  Marland Kitchen CHF (congestive heart failure) ([place])   . Chronic kidney disease   . Dyspnea on exertion    a. 01/2013 Lexi MV: EF 54%, no ischemia/infarct;  b. 05/2013 Echo: EF 60-65%, no rwma, Gr 2 DD;  b.   . Fatty liver   . Gastroparesis   . GERD (gastroesophageal reflux disease)   . Hypertension   . Impaired glucose tolerance 04/13/2011  . Lymphadenitis, chronic    restricted LEFT extremity  . 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 OSA (obstructive sleep apnea) 05/04/2014   AHI 31/hr now on CPAP at 12cm H2O  . Oxygen deficiency    Uses 02 in CPAP  . Personal history of chemotherapy 2010  . Personal history of radiation therapy 2010  . Sleep apnea  Current Outpatient Medications on File Prior to Visit  Medication Sig Dispense Refill  . acetaminophen-codeine (TYLENOL #4) 300-60 MG tablet Take 1 tablet by mouth every 6 (six) hours as needed for pain. 30 tablet 0  . acetaZOLAMIDE (DIAMOX) 250 MG tablet Take 4 tablets (1,000 mg total) by mouth 3 (three) times daily. 1080 tablet 3  . albuterol (PROVENTIL) (2.5 MG/3ML) 0.083% nebulizer solution USE 1 VIAL VIA NEBULIZER EVERY 4 HOURS AND AS NEEDED FOR WHEEZING OR SHORTNESS OF BREATH 75 mL 1  . albuterol (VENTOLIN HFA) 108 (90 Base) MCG/ACT inhaler INHALE 2 PUFFS BY MOUTH EVERY 4 HOURS AS NEEDED FOR WHEEZE OR FOR SHORTNESS OF BREATH 18 Inhaler 1  . amitriptyline (ELAVIL) 100 MG tablet TAKE 1 TABLET BY MOUTH EVERYDAY AT BEDTIME 90 tablet 0  . Biotin 5000 MCG TABS Take 5,000 mg by  mouth 2 (two) times daily.     . Cholecalciferol 1.25 MG (50000 UT) capsule Take 1 capsule (50,000 Units total) by mouth once a week. 12 capsule 1  . cloNIDine (CATAPRES - DOSED IN MG/24 HR) 0.3 mg/24hr patch PLACE 1 PATCH ONTO SKIN ONCE WEEKLY 12 patch 1  . clopidogrel (PLAVIX) 75 MG tablet Take 1 tablet (75 mg total) by mouth daily. 90 tablet 3  . clotrimazole (MYCELEX) 10 MG troche Take 1 tablet (10 mg total) by mouth 5 (five) times daily. 30 tablet 11  . dexlansoprazole (DEXILANT) 60 MG capsule Take 1 capsule (60 mg total) by mouth daily. 90 capsule 3  . dextromethorphan-guaiFENesin (MUCINEX DM) 30-600 MG 12hr tablet Take 2 tablets by mouth 2 (two) times daily.     . Diclofenac Sodium 2 % SOLN Place 2 g onto the skin 2 (two) times daily. 112 g 3  . doxazosin (CARDURA) 8 MG tablet Take 1 tablet (8 mg total) by mouth daily. 90 tablet 3  . DULoxetine (CYMBALTA) 30 MG capsule Take 1 capsule (30 mg total) by mouth daily. With 60mg  for total of 90 mg 90 capsule 3  . DULoxetine (CYMBALTA) 60 MG capsule Take 1 capsule (60 mg total) by mouth daily. For total of 90 mg 90 capsule 3  . EPINEPHRINE 0.3 mg/0.3 mL IJ SOAJ injection INJECT 0.3 MLS INTO THE MUSCLE ONCE AS NEEDED FOR ANAPHYLAXIS 2 Device 2  . ezetimibe (ZETIA) 10 MG tablet Take 1 tablet (10 mg total) by mouth daily. 90 tablet 1  . famotidine (PEPCID) 20 MG tablet TAKE 1-2 TABLETS (20-40 MG TOTAL) BY MOUTH AT BEDTIME. 180 tablet 0  . hydrALAZINE (APRESOLINE) 100 MG tablet TAKE 1 TABLET (100 MG TOTAL) BY MOUTH 3 (THREE) TIMES DAILY. 270 tablet 2  . lidocaine-prilocaine (EMLA) cream Apply 1 application topically every 28 (twenty-eight) days. Apply 1 application to skin ever 28 days    . metoCLOPramide (REGLAN) 10 MG tablet Take 1 tablet (10 mg total) by mouth 4 (four) times daily. 120 tablet 2  . montelukast (SINGULAIR) 10 MG tablet TAKE 1 TABLET BY MOUTH EVERYDAY AT BEDTIME 90 tablet 1  . Nebivolol HCl 20 MG TABS Take 40 mg by mouth daily.    Marland Kitchen  NITROSTAT 0.4 MG SL tablet PLACE 1 TABLET UNDER TONGUE EVERY 5 MINUTES AS NEEDED FOR CHEST PAIN, MAX OF 3 TABLETS THEN CALL 911 IF PAIN PERSISTS 100 tablet 0  . Olmesartan-amLODIPine-HCTZ 40-10-25 MG TABS Take 1 tablet by mouth daily. 30 tablet 5  . oxymetazoline (MUCINEX SINUS-MAX FULL FORCE) 0.05 % nasal spray Place 1 spray into both nostrils 2 (two) times  daily.     . Potassium Chloride ER 20 MEQ TBCR TAKE 1 TABLET BY MOUTH TWICE A DAY 180 tablet 0  . primidone (MYSOLINE) 50 MG tablet Take 0.5 tablets (25 mg total) by mouth at bedtime. 45 tablet 3  . promethazine (PHENERGAN) 25 MG suppository Place 25 mg rectally every 6 (six) hours as needed for nausea or vomiting.    . promethazine (PHENERGAN) 25 MG tablet Take 1 tablet (25 mg total) by mouth 3 (three) times daily as needed for nausea or vomiting. 270 tablet 1  . ranolazine (RANEXA) 500 MG 12 hr tablet TAKE 1 TABLET BY MOUTH TWICE A DAY 180 tablet 2  . Respiratory Therapy Supplies (FLUTTER) DEVI Use as directed 1 each 0  . rosuvastatin (CRESTOR) 40 MG tablet Take 1 tablet (40 mg total) by mouth daily. 90 tablet 2  . Spacer/Aero-Holding Chambers (AEROCHAMBER MV) inhaler Use as instructed 1 each 0  . spironolactone (ALDACTONE) 100 MG tablet TAKE 1 TABLET BY MOUTH EVERY DAY 90 tablet 2  . SYMBICORT 160-4.5 MCG/ACT inhaler INHALE 2 PUFFS INTO LUNGS 2 TIMES DAILY AS DIRECTED 10.2 g 11  . torsemide (DEMADEX) 20 MG tablet Take 20 mg by mouth daily.     No current facility-administered medications on file prior to visit.    Allergies  Allergen Reactions  . Aspirin Shortness Of Breath and Palpitations    Pt can take ibuprofen without reaction  . Doxycycline Anaphylaxis  . Kiwi Extract Itching and Swelling    Lip swelling  . Metoprolol Hives and Itching  . Penicillins Shortness Of Breath and Palpitations    Has patient had a PCN reaction causing immediate rash, facial/tongue/throat swelling, SOB or lightheadedness with hypotension: Yes Has  patient had a PCN reaction causing severe rash involving mucus membranes or skin necrosis: Yes Has patient had a PCN reaction that required hospitalization Yes Has patient had a PCN reaction occurring within the last 10 years: No If all of the above answers are "NO", then may proceed with Cephalosporin use.  . Strawberry Extract Hives  . Sulfamethoxazole-Trimethoprim Anaphylaxis    Facial, throat and tongue swelling with difficulty swallowing.     Assessment/Plan:  1. Hypertension - BP today 192/88 (recheck 178/88) which is above goal of <130/80 despite multiple medications from multiple classes at maximum doses.  Patient reports compliance however blood pressure has remained elevated.  Unclear what home BP readings are.    Counseled patient on good sleep habits and recommended listening to more calming audio books to help her relax.  Recommended patient continue to use counseling services offered by insurance company and to reach out to PCP for referral to therapy services since patient reports these have been helpful.    Medications are at max doses in multiple drug classes so pharmacologic options are limited.  Will pursue trial of minoxidil to see if patient has any therapeutic effect.  Counseled patient on administration and possible side effects and patient voiced understanding.  Recheck in 1-2 months.  Karren Cobble, PharmD, BCACP, [facility] [address], [place], [place] [address] Phone: [phone]; Fax: [phone] 10/08/2019 8:07 AM

## 2019-10-08 ENCOUNTER — Encounter: Payer: Self-pay | Admitting: Pharmacist

## 2019-10-11 ENCOUNTER — Other Ambulatory Visit: Payer: Self-pay | Admitting: Internal Medicine

## 2019-10-11 DIAGNOSIS — I10 Essential (primary) hypertension: Secondary | ICD-10-CM

## 2019-10-11 DIAGNOSIS — G932 Benign intracranial hypertension: Secondary | ICD-10-CM

## 2019-10-11 MED ORDER — CLONIDINE 0.3 MG/24HR TD PTWK: MEDICATED_PATCH | TRANSDERMAL | 1 refills | Status: DC

## 2019-10-11 NOTE — Telephone Encounter (Signed)
Rx(s) sent to pharmacy electronically.  

## 2019-10-13 ENCOUNTER — Other Ambulatory Visit: Payer: Self-pay

## 2019-10-13 ENCOUNTER — Inpatient Hospital Stay: Payer: Medicare Other | Attending: Oncology

## 2019-10-13 VITALS — BP 195/96 | HR 104 | Temp 98.0°F | Resp 18

## 2019-10-13 DIAGNOSIS — Z79818 Long term (current) use of other agents affecting estrogen receptors and estrogen levels: Secondary | ICD-10-CM | POA: Insufficient documentation

## 2019-10-13 DIAGNOSIS — C50412 Malignant neoplasm of upper-outer quadrant of left female breast: Secondary | ICD-10-CM | POA: Diagnosis present

## 2019-10-13 MED ORDER — GOSERELIN ACETATE 3.6 MG ~~LOC~~ IMPL
DRUG_IMPLANT | SUBCUTANEOUS | Status: AC
  Filled 2019-10-13: qty 3.6

## 2019-10-13 MED ORDER — GOSERELIN ACETATE 3.6 MG ~~LOC~~ IMPL
3.6000 mg | DRUG_IMPLANT | SUBCUTANEOUS | Status: DC
  Administered 2019-10-13: 3.6 mg via SUBCUTANEOUS

## 2019-10-13 MED ORDER — EPOETIN ALFA-EPBX 10000 UNIT/ML IJ SOLN
INTRAMUSCULAR | Status: AC
  Filled 2019-10-13: qty 2

## 2019-10-13 NOTE — Patient Instructions (Signed)

## 2019-10-26 ENCOUNTER — Telehealth (INDEPENDENT_AMBULATORY_CARE_PROVIDER_SITE_OTHER): Payer: Medicare Other | Admitting: Cardiology

## 2019-10-26 ENCOUNTER — Encounter: Payer: Self-pay | Admitting: Cardiology

## 2019-10-26 ENCOUNTER — Other Ambulatory Visit: Payer: Self-pay

## 2019-10-26 VITALS — BP 168/93 | HR 94 | Ht 66.0 in | Wt 205.0 lb

## 2019-10-26 DIAGNOSIS — G4733 Obstructive sleep apnea (adult) (pediatric): Secondary | ICD-10-CM

## 2019-10-26 DIAGNOSIS — E669 Obesity, unspecified: Secondary | ICD-10-CM

## 2019-10-26 DIAGNOSIS — I10 Essential (primary) hypertension: Secondary | ICD-10-CM | POA: Diagnosis not present

## 2019-10-26 NOTE — Progress Notes (Signed)
Virtual Visit via Telephone Note   This visit type was conducted due to national recommendations for restrictions regarding the COVID-19 Pandemic (e.g. social distancing) in an effort to limit this patient's exposure and mitigate transmission in our community.  Due to her co-morbid illnesses, this patient is at least at moderate risk for complications without adequate follow up.  This format is felt to be most appropriate for this patient at this time.  The patient did not have access to video technology/had technical difficulties with video requiring transitioning to audio format only (telephone).  All issues noted in this document were discussed and addressed.  No physical exam could be performed with this format.  Please refer to the patient's chart for her  consent to telehealth for [facility].   Evaluation Performed:  Follow-up visit  This visit type was conducted due to national recommendations for restrictions regarding the COVID-19 Pandemic (e.g. social distancing).  This format is felt to be most appropriate for this patient at this time.  All issues noted in this document were discussed and addressed.  No physical exam was performed (except for noted visual exam findings with Video Visits).  Please refer to the patient's chart (MyChart message for video visits and phone note for telephone visits) for the patient's consent to telehealth for [facility]  Date:  10/26/2019   ID:  HARRYETTE [name], DOB 05-13-1975, MRN [id number]  Patient Location:  Home  Provider location:   [place]  PCP:  [name], MD  Cardiologist:  Ena Dawley, MD  Sleep Medicine:  Fransico Him, MD Electrophysiologist:  None   Chief Complaint:  OSA  History of Present Illness:    Erica [name] is a 44 y.o. female who presents via audio/video conferencing for a telehealth visit today.    Erica [name] is a 44 y.o. female with a hx of severe OSA with an AHI of 31/hr mainly in NREM supine  sleep with successful CPAP titration to 12cm H2O in 2016.  She had issues on CPAP complaining that she was not getting enough air and waking up with a lot of mucus which was resulting in noncompliance with her device.  She was sent back to the sleep lab for a BiPAP titration which she did not have until a year later but was titrated to 17/13cm H2O.    She is doing well with her PAP device and is trying to get used to it.  It has been hard for her to get used to her mask but is using it at least 4 hours nightly.  She feels the pressure is adequate.  Since going on PAP she feels rested in the am and has no significant daytime sleepiness.  She has problems with mouth dryness and has adjusted her humidity and is using Biotene mouth dryness.    The patient does not have symptoms concerning for COVID-19 infection (fever, chills, cough, or new shortness of breath).    Prior CV studies:   The following studies were reviewed today:  BiPAP titration and PAP download  Past Medical History:  Diagnosis Date  . Allergy   . Anxiety   . Apnea, sleep 05/04/2014   AHI 31/hr now on CPAP at 12cm H2O  . Arthritis   . Asthma   . Asthma without status asthmaticus 01/19/2016  . Axillary hidradenitis suppurativa 08/01/2011  . Blood transfusion without reported diagnosis   . Breast cancer ([place]) 2010   a. locally advanced left breast carcinoma  diagnosed in 2010 and treated with neoadjuvant chemotherapy with docetaxel and cyclophosphamide as well as paclitaxel. This was followed by radiation therapy which was completed in 2011.  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 CHF (congestive heart failure) ([place])   . Chronic diastolic CHF (congestive heart failure) ([place]) 06/23/2013  . Chronic kidney disease   . Chronic pain in left foot 06/29/2018  . Cough variant asthma 09/27/2010   Followed in Pulmonary clinic/ [facility]/ [place]  Onset reported in childhood  - PFT's 01/31/2011 FEV1  1.76 (60% )  ratio 68 and no better p B2,  DLCO 70% -med review 09/25/2012 > [place] drug assistance  paperwork given    - HFA 90% 11/20/2012 .  -Med calendar 12/07/12 , 04/06/2013 , 04/18/2014,, 08/30/2014 , 11/08/2014  - PFT's 01/12/2013  1.73 (65%)  Ratio 73 and no better p B2, DLCO 86% - trial off   . 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 Dyspnea on exertion    a. 01/2013 Lexi MV: EF 54%, no ischemia/infarct;  b. 05/2013 Echo: EF 60-65%, no rwma, Gr 2 DD;  b.   . Essential hypertension 10/09/2014   Estimated Creatinine Clearance: 82.4 mL/min (by C-G formula based on SCr of 0.98 mg/dL).  . Fatty liver   . Gastroparesis   . GERD (gastroesophageal reflux disease)   . Hyperglycemia 04/13/2011  . Hypertension   . Hypertensive heart disease 09/26/2015  . Hypertriglyceridemia without hypercholesterolemia 06/15/2015   Framingham risk score is 1%   . Idiopathic intracranial hypertension 10/21/2014  . Impaired glucose tolerance 04/13/2011  . Insomnia secondary to anxiety 04/05/2015  . Lymphadenitis, chronic    restricted LEFT extremity  . Malignant neoplasm of upper-outer quadrant of left breast in female, estrogen receptor negative ([place]) 01/07/2013   Stage III, Invasive Ductal, s/p partial Left Mastectomy/Lumpectomy (baseball sized) with Lymph Node dissection, had Chemo and RadRx    . 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 OSA (obstructive sleep apnea) 05/04/2014   AHI 31/hr now on CPAP at 12cm H2O  . Oxygen deficiency    Uses 02 in CPAP  . Persistent proteinuria 03/31/2018  . Personal history of chemotherapy 2010  . Personal history of radiation therapy 2010  . Sleep apnea   . Tibialis posterior tendinitis, left 07/15/2018   Injected in March 10, 2019  . Upper airway cough syndrome 10/09/2014   Onset around 2012 with assoc pseudowheeze MRI 05/18/15 > Paranasal sinuses are clear - 11/17/2015 rec increase h1 to 2 hs to help with noct cough  - Allergy profile 11/17/2015 >  Eos 0.1 /  IgE  80 POS RAST  to grass, ragweed,  cedar tree  - d/c acei 03/06/2018  - flare end of Jan 2020 in setting of ? Uri/ poorly controlled GERD - flare mid Oct 2020 with uri / assoc secondary gerd  . Vitamin D deficiency disease 03/31/2018   Past Surgical History:  Procedure Laterality Date  . BRAIN SURGERY     [place]  . BREAST LUMPECTOMY Left 09/2008  . CARPAL TUNNEL RELEASE Left 2011  . LYMPH NODE DISSECTION Left 2010   breast; 2 wks after breast lumpectomy  . RIGHT/LEFT HEART CATH AND CORONARY ANGIOGRAPHY N/A 05/04/2019   Procedure: RIGHT/LEFT HEART CATH AND CORONARY ANGIOGRAPHY;  Surgeon: Jolaine Artist, MD;  Location: [facility];  Service: Cardiovascular;  Laterality: N/A;  Renal injection preformed   . TENDON RELEASE Right 2012   "hand"  . TUBAL LIGATION  05/11/2011  Procedure: POST PARTUM TUBAL LIGATION;  Surgeon: Emeterio Reeve, MD;  Location: [facility];  Service: Gynecology;  Laterality: Bilateral;  Induced for HTN     Current Meds  Medication Sig  . acetaminophen-codeine (TYLENOL #4) 300-60 MG tablet Take 1 tablet by mouth every 6 (six) hours as needed for pain.  Marland Kitchen acetaZOLAMIDE (DIAMOX) 250 MG tablet Take 4 tablets (1,000 mg total) by mouth 3 (three) times daily.  Marland Kitchen albuterol (PROVENTIL) (2.5 MG/3ML) 0.083% nebulizer solution USE 1 VIAL VIA NEBULIZER EVERY 4 HOURS AND AS NEEDED FOR WHEEZING OR SHORTNESS OF BREATH  . albuterol (VENTOLIN HFA) 108 (90 Base) MCG/ACT inhaler INHALE 2 PUFFS BY MOUTH EVERY 4 HOURS AS NEEDED FOR WHEEZE OR FOR SHORTNESS OF BREATH  . amitriptyline (ELAVIL) 100 MG tablet TAKE 1 TABLET BY MOUTH EVERYDAY AT BEDTIME  . Biotin 5000 MCG TABS Take 5,000 mg by mouth 2 (two) times daily.   . Cholecalciferol 1.25 MG (50000 UT) capsule Take 1 capsule (50,000 Units total) by mouth once a week.  . cloNIDine (CATAPRES - DOSED IN MG/24 HR) 0.3 mg/24hr patch PLACE 1 PATCH ONTO SKIN ONCE WEEKLY  . clopidogrel (PLAVIX) 75 MG tablet Take 1 tablet (75 mg total) by mouth daily.  . clotrimazole (MYCELEX) 10 MG  troche Take 1 tablet (10 mg total) by mouth 5 (five) times daily.  Marland Kitchen dexlansoprazole (DEXILANT) 60 MG capsule Take 1 capsule (60 mg total) by mouth daily.  Marland Kitchen dextromethorphan-guaiFENesin (MUCINEX DM) 30-600 MG 12hr tablet Take 2 tablets by mouth 2 (two) times daily.   . Diclofenac Sodium 2 % SOLN Place 2 g onto the skin 2 (two) times daily.  Marland Kitchen doxazosin (CARDURA) 8 MG tablet Take 1 tablet (8 mg total) by mouth daily.  . DULoxetine (CYMBALTA) 30 MG capsule Take 1 capsule (30 mg total) by mouth daily. With 60mg  for total of 90 mg  . DULoxetine (CYMBALTA) 60 MG capsule Take 1 capsule (60 mg total) by mouth daily. For total of 90 mg  . EPINEPHRINE 0.3 mg/0.3 mL IJ SOAJ injection INJECT 0.3 MLS INTO THE MUSCLE ONCE AS NEEDED FOR ANAPHYLAXIS  . ezetimibe (ZETIA) 10 MG tablet Take 1 tablet (10 mg total) by mouth daily.  . famotidine (PEPCID) 20 MG tablet TAKE 1-2 TABLETS (20-40 MG TOTAL) BY MOUTH AT BEDTIME.  . hydrALAZINE (APRESOLINE) 100 MG tablet TAKE 1 TABLET (100 MG TOTAL) BY MOUTH 3 (THREE) TIMES DAILY.  Marland Kitchen lidocaine-prilocaine (EMLA) cream Apply 1 application topically every 28 (twenty-eight) days. Apply 1 application to skin ever 28 days  . metoCLOPramide (REGLAN) 10 MG tablet Take 1 tablet (10 mg total) by mouth 4 (four) times daily.  . minoxidil (LONITEN) 2.5 MG tablet Take 2 tablets (5 mg total) by mouth daily.  . montelukast (SINGULAIR) 10 MG tablet TAKE 1 TABLET BY MOUTH EVERYDAY AT BEDTIME  . Nebivolol HCl (BYSTOLIC) 20 MG TABS Take 1 tablet (20 mg total) by mouth daily.  Marland Kitchen NITROSTAT 0.4 MG SL tablet PLACE 1 TABLET UNDER TONGUE EVERY 5 MINUTES AS NEEDED FOR CHEST PAIN, MAX OF 3 TABLETS THEN CALL 911 IF PAIN PERSISTS  . Olmesartan-amLODIPine-HCTZ 40-10-25 MG TABS Take 1 tablet by mouth daily.  Marland Kitchen oxymetazoline (MUCINEX SINUS-MAX FULL FORCE) 0.05 % nasal spray Place 1 spray into both nostrils 2 (two) times daily.   . primidone (MYSOLINE) 50 MG tablet Take 0.5 tablets (25 mg total) by mouth at  bedtime.  . promethazine (PHENERGAN) 25 MG suppository Place 25 mg rectally every 6 (six) hours  as needed for nausea or vomiting.   . promethazine (PHENERGAN) 25 MG tablet TAKE 1 TABLET (25 MG TOTAL) BY MOUTH 3 (THREE) TIMES DAILY AS NEEDED FOR NAUSEA OR VOMITING.  . ranolazine (RANEXA) 500 MG 12 hr tablet TAKE 1 TABLET BY MOUTH TWICE A DAY  . Respiratory Therapy Supplies (FLUTTER) DEVI Use as directed  . rosuvastatin (CRESTOR) 40 MG tablet Take 1 tablet (40 mg total) by mouth daily.  Marland Kitchen Spacer/Aero-Holding Chambers (AEROCHAMBER MV) inhaler Use as instructed  . spironolactone (ALDACTONE) 100 MG tablet TAKE 1 TABLET BY MOUTH EVERY DAY  . SYMBICORT 160-4.5 MCG/ACT inhaler INHALE 2 PUFFS INTO LUNGS 2 TIMES DAILY AS DIRECTED  . torsemide (DEMADEX) 20 MG tablet Take 20 mg by mouth daily.     Allergies:   Aspirin, Doxycycline, Kiwi extract, Metoprolol, Penicillins, Strawberry extract, and Sulfamethoxazole-trimethoprim   Social History   Tobacco Use  . Smoking status: Never Smoker  . Smokeless tobacco: Never Used  Vaping Use  . Vaping Use: Never used  Substance Use Topics  . Alcohol use: Yes    Alcohol/week: 0.0 standard drinks    Comment: occasionally/socially  . Drug use: No     Family Hx: The patient's family history includes Breast cancer in some other family members; CAD in her maternal grandmother; Diabetes in her maternal grandmother; Fibroids (age of onset: 76) in her sister; Heart disease in her maternal grandmother; Hypertension in her maternal grandmother; Other (age of onset: 82) in her mother. There is no history of Cancer, Alcohol abuse, Early death, Hyperlipidemia, Kidney disease, Stroke, Colon cancer, Esophageal cancer, Stomach cancer, or Rectal cancer.  ROS:   Please see the history of present illness.     All other systems reviewed and are negative.   Labs/Other Tests and Data Reviewed:    Recent Labs: 04/30/2019: NT-Pro BNP 346; Platelets 216; TSH 1.470 05/04/2019:  Hemoglobin 12.6; Hemoglobin 12.9 06/04/2019: ALT 23 09/20/2019: BUN 13; Creatinine, Ser 1.05; Potassium 3.3; Sodium 143   Recent Lipid Panel Lab Results  Component Value Date/Time   CHOL 176 06/04/2019 08:25 AM   TRIG 86 06/04/2019 08:25 AM   HDL 42 06/04/2019 08:25 AM   CHOLHDL 4.2 06/04/2019 08:25 AM   CHOLHDL 4 10/01/2018 11:15 AM   LDLCALC 118 (H) 06/04/2019 08:25 AM   LDLDIRECT 152.0 03/30/2018 04:04 PM    Wt Readings from Last 3 Encounters:  10/26/19 (!) 205 lb (93 kg)  10/07/19 208 lb 6.4 oz (94.5 kg)  09/06/19 204 lb (92.5 kg)     Objective:    Vital Signs:  BP (!) 168/93   Pulse 94   Ht 5\' 6"  (1.676 m)   Wt (!) 205 lb (93 kg)   BMI 33.09 kg/m     ASSESSMENT & PLAN:    1. OSA -   The patient is tolerating PAP therapy well without any problems.   The patient has been using and benefiting from PAP use and will continue to benefit from therapy.  -I will get a download from her DME -Her device is on recall and she is awaiting further recommendations from the company  2.  HTN -BP is elevated today -continue amlodipine 10mg  daily, Clonidine patch, Doxazosin 8mg  daily, Hydralazine 100mg  TID and Bystolic 20mg  daily -she has not taken her meds yet  3.  Obesity -I have encouraged her to get into a routine exercise program and cut back on carbs and portions.    COVID-19 Education: The signs and symptoms of COVID-19 were  discussed with the patient and how to seek care for testing (follow up with PCP or arrange E-visit).  The importance of social distancing was discussed today.  Patient Risk:   After full review of this patient's clinical status, I feel that they are at least moderate risk at this time.  Time:   Today, I have spent 20 minutes on telemedicine discussing medical problems including OSA, HTN, obesity and reviewing patient's chart including BiPAP titration and PAP compliance download.  Medication Adjustments/Labs and Tests Ordered: Current medicines are  reviewed at length with the patient today.  Concerns regarding medicines are outlined above.  Tests Ordered: No orders of the defined types were placed in this encounter.  Medication Changes: No orders of the defined types were placed in this encounter.   Disposition:  Follow up in 6 months  Signed, Fransico Him, MD  10/26/2019 8:15 AM    [place]

## 2019-11-02 NOTE — Progress Notes (Signed)
Cardiology Office Note    Date:  11/03/2019   ID:  Erica [name], DOB Aug 25, 1975, MRN [id number]  PCP:  [name], MD  Cardiologist: Ena Dawley, MD EPS: None  Chief Complaint  Patient presents with  . Shortness of Breath    History of Present Illness:  Erica [name] is a 44 y.o. female with history of resistant hypertension followed by Dr. 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  OSA intolerant to CPAP but now using again.  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  Patient comes in because she is short of breath with little activity. Using O2 at night but feels like she needs it during the day. No fluid build up and not using salt. BP still high.   Past Medical History:  Diagnosis Date  . Allergy   . Anxiety   . Apnea, sleep 05/04/2014   AHI 31/hr now on CPAP at 12cm H2O  . Arthritis   . Asthma   . Asthma without status asthmaticus 01/19/2016  . Axillary hidradenitis suppurativa 08/01/2011  . Blood transfusion without reported diagnosis   . Breast cancer ([place]) 2010   a. locally advanced left breast carcinoma diagnosed in 2010 and treated with neoadjuvant chemotherapy with docetaxel and cyclophosphamide as well as paclitaxel. This was followed by radiation therapy which was completed in 2011.  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 CHF (congestive heart failure) ([place])   . Chronic diastolic CHF (congestive heart failure) ([place]) 06/23/2013  . Chronic kidney disease   . Chronic pain in left foot 06/29/2018  . Cough variant  asthma 09/27/2010   Followed in Pulmonary clinic/ [facility]/ [place]  Onset reported in childhood  - PFT's 01/31/2011 FEV1  1.76 (60% ) ratio 68 and no better p B2,  DLCO 70% -med review 09/25/2012 > [place] drug assistance  paperwork given    - HFA 90% 11/20/2012 .  -Med calendar 12/07/12 , 04/06/2013 , 04/18/2014,, 08/30/2014 , 11/08/2014  - PFT's 01/12/2013  1.73 (65%)  Ratio 73 and no better p B2, DLCO 86% - trial off   . 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 Dyspnea on exertion    a. 01/2013 Lexi MV: EF 54%, no ischemia/infarct;  b. 05/2013 Echo: EF 60-65%, no rwma, Gr 2 DD;  b.   . Essential hypertension 10/09/2014   Estimated Creatinine Clearance: 82.4 mL/min (by C-G formula based on SCr of 0.98 mg/dL).  . Fatty liver   . Gastroparesis   . GERD (gastroesophageal reflux disease)   . Hyperglycemia 04/13/2011  . Hypertension   . Hypertensive heart disease 09/26/2015  . Hypertriglyceridemia without hypercholesterolemia 06/15/2015   Framingham risk score is 1%   . Idiopathic intracranial hypertension 10/21/2014  . Impaired glucose tolerance 04/13/2011  . Insomnia secondary to anxiety 04/05/2015  . Lymphadenitis, chronic    restricted LEFT extremity  . Malignant neoplasm of upper-outer quadrant of left breast in female, estrogen receptor negative ([place]) 01/07/2013   Stage III, Invasive Ductal, s/p partial Left Mastectomy/Lumpectomy (baseball sized) with Lymph Node dissection, had Chemo and RadRx    . 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 OSA (  obstructive sleep apnea) 05/04/2014   AHI 31/hr now on CPAP at 12cm H2O  . Oxygen deficiency    Uses 02 in CPAP  . Persistent proteinuria 03/31/2018  . Personal history of chemotherapy 2010  . Personal history of radiation therapy 2010  . Sleep apnea   . Tibialis posterior tendinitis, left 07/15/2018   Injected in March 10, 2019  . Upper airway cough syndrome 10/09/2014   Onset around 2012 with assoc pseudowheeze MRI 05/18/15 > Paranasal sinuses are clear  - 11/17/2015 rec increase h1 to 2 hs to help with noct cough  - Allergy profile 11/17/2015 >  Eos 0.1 /  IgE  80 POS RAST  to grass, ragweed, cedar tree  - d/c acei 03/06/2018  - flare end of Jan 2020 in setting of ? Uri/ poorly controlled GERD - flare mid Oct 2020 with uri / assoc secondary gerd  . Vitamin D deficiency disease 03/31/2018    Past Surgical History:  Procedure Laterality Date  . BRAIN SURGERY     Shunt  . BREAST LUMPECTOMY Left 09/2008  . CARPAL TUNNEL RELEASE Left 2011  . LYMPH NODE DISSECTION Left 2010   breast; 2 wks after breast lumpectomy  . RIGHT/LEFT HEART CATH AND CORONARY ANGIOGRAPHY N/A 05/04/2019   Procedure: RIGHT/LEFT HEART CATH AND CORONARY ANGIOGRAPHY;  Surgeon: Jolaine Artist, MD;  Location: [facility];  Service: Cardiovascular;  Laterality: N/A;  Renal injection preformed   . TENDON RELEASE Right 2012   "hand"  . TUBAL LIGATION  05/11/2011   Procedure: POST PARTUM TUBAL LIGATION;  Surgeon: Emeterio Reeve, MD;  Location: [facility];  Service: Gynecology;  Laterality: Bilateral;  Induced for HTN    Current Medications: Current Meds  Medication Sig  . acetaminophen-codeine (TYLENOL #4) 300-60 MG tablet Take 1 tablet by mouth every 6 (six) hours as needed for pain.  Marland Kitchen acetaZOLAMIDE (DIAMOX) 250 MG tablet Take 4 tablets (1,000 mg total) by mouth 3 (three) times daily.  Marland Kitchen albuterol (PROVENTIL) (2.5 MG/3ML) 0.083% nebulizer solution USE 1 VIAL VIA NEBULIZER EVERY 4 HOURS AND AS NEEDED FOR WHEEZING OR SHORTNESS OF BREATH  . albuterol (VENTOLIN HFA) 108 (90 Base) MCG/ACT inhaler INHALE 2 PUFFS BY MOUTH EVERY 4 HOURS AS NEEDED FOR WHEEZE OR FOR SHORTNESS OF BREATH  . amitriptyline (ELAVIL) 100 MG tablet TAKE 1 TABLET BY MOUTH EVERYDAY AT BEDTIME  . Biotin 5000 MCG TABS Take 5,000 mg by mouth 2 (two) times daily.   . Cholecalciferol 1.25 MG (50000 UT) capsule Take 1 capsule (50,000 Units total) by mouth once a week.  . cloNIDine (CATAPRES - DOSED IN MG/24 HR) 0.3  mg/24hr patch PLACE 1 PATCH ONTO SKIN ONCE WEEKLY  . clopidogrel (PLAVIX) 75 MG tablet Take 1 tablet (75 mg total) by mouth daily.  . clotrimazole (MYCELEX) 10 MG troche Take 1 tablet (10 mg total) by mouth 5 (five) times daily.  Marland Kitchen dexlansoprazole (DEXILANT) 60 MG capsule Take 1 capsule (60 mg total) by mouth daily.  Marland Kitchen dextromethorphan-guaiFENesin (MUCINEX DM) 30-600 MG 12hr tablet Take 2 tablets by mouth 2 (two) times daily.   . Diclofenac Sodium 2 % SOLN Place 2 g onto the skin 2 (two) times daily.  Marland Kitchen doxazosin (CARDURA) 8 MG tablet Take 1 tablet (8 mg total) by mouth daily.  . DULoxetine (CYMBALTA) 30 MG capsule Take 1 capsule (30 mg total) by mouth daily. With 60mg  for total of 90 mg  . DULoxetine (CYMBALTA) 60 MG capsule Take 1 capsule (60  mg total) by mouth daily. For total of 90 mg  . EPINEPHRINE 0.3 mg/0.3 mL IJ SOAJ injection INJECT 0.3 MLS INTO THE MUSCLE ONCE AS NEEDED FOR ANAPHYLAXIS  . ezetimibe (ZETIA) 10 MG tablet Take 1 tablet (10 mg total) by mouth daily.  . famotidine (PEPCID) 20 MG tablet TAKE 1-2 TABLETS (20-40 MG TOTAL) BY MOUTH AT BEDTIME.  . hydrALAZINE (APRESOLINE) 100 MG tablet TAKE 1 TABLET (100 MG TOTAL) BY MOUTH 3 (THREE) TIMES DAILY.  Marland Kitchen lidocaine-prilocaine (EMLA) cream Apply 1 application topically every 28 (twenty-eight) days. Apply 1 application to skin ever 28 days  . metoCLOPramide (REGLAN) 10 MG tablet Take 1 tablet (10 mg total) by mouth 4 (four) times daily.  . montelukast (SINGULAIR) 10 MG tablet TAKE 1 TABLET BY MOUTH EVERYDAY AT BEDTIME  . Nebivolol HCl (BYSTOLIC) 20 MG TABS Take 1 tablet (20 mg total) by mouth daily.  Marland Kitchen NITROSTAT 0.4 MG SL tablet PLACE 1 TABLET UNDER TONGUE EVERY 5 MINUTES AS NEEDED FOR CHEST PAIN, MAX OF 3 TABLETS THEN CALL 911 IF PAIN PERSISTS  . Olmesartan-amLODIPine-HCTZ 40-10-25 MG TABS Take 1 tablet by mouth daily.  Marland Kitchen oxymetazoline (MUCINEX SINUS-MAX FULL FORCE) 0.05 % nasal spray Place 1 spray into both nostrils 2 (two) times daily.    . Potassium Chloride ER 20 MEQ TBCR TAKE 1 TABLET BY MOUTH TWICE A DAY  . primidone (MYSOLINE) 50 MG tablet Take 0.5 tablets (25 mg total) by mouth at bedtime.  . promethazine (PHENERGAN) 25 MG suppository Place 25 mg rectally every 6 (six) hours as needed for nausea or vomiting.   . promethazine (PHENERGAN) 25 MG tablet TAKE 1 TABLET (25 MG TOTAL) BY MOUTH 3 (THREE) TIMES DAILY AS NEEDED FOR NAUSEA OR VOMITING.  . ranolazine (RANEXA) 500 MG 12 hr tablet TAKE 1 TABLET BY MOUTH TWICE A DAY  . Respiratory Therapy Supplies (FLUTTER) DEVI Use as directed  . rosuvastatin (CRESTOR) 40 MG tablet Take 1 tablet (40 mg total) by mouth daily.  Marland Kitchen Spacer/Aero-Holding Chambers (AEROCHAMBER MV) inhaler Use as instructed  . spironolactone (ALDACTONE) 100 MG tablet TAKE 1 TABLET BY MOUTH EVERY DAY  . SYMBICORT 160-4.5 MCG/ACT inhaler INHALE 2 PUFFS INTO LUNGS 2 TIMES DAILY AS DIRECTED  . torsemide (DEMADEX) 20 MG tablet Take 20 mg by mouth daily.  . [DISCONTINUED] minoxidil (LONITEN) 2.5 MG tablet Take 2 tablets (5 mg total) by mouth daily.     Allergies:   Aspirin, Doxycycline, Kiwi extract, Metoprolol, Penicillins, Strawberry extract, and Sulfamethoxazole-trimethoprim   Social History   Socioeconomic History  . Marital status: Single    Spouse name: Not on file  . Number of children: 3  . Years of education: Not on file  . Highest education level: Not on file  Occupational History    Employer: UNEMPLOYED  Tobacco Use  . Smoking status: Never Smoker  . Smokeless tobacco: Never Used  Vaping Use  . Vaping Use: Never used  Substance and Sexual Activity  . Alcohol use: Yes    Alcohol/week: 0.0 standard drinks    Comment: occasionally/socially  . Drug use: No  . Sexual activity: Yes    Birth control/protection: Surgical  Other Topics Concern  . Not on file  Social History Narrative   She lives with four children.   She is currently not working (disbaility pending).   Left handed   One story  home   Drinks caffeine      Social Determinants of Health   Financial Resource Strain:   .  Difficulty of Paying Living Expenses:   Food Insecurity: Food Insecurity Present  . Worried About Charity fundraiser in the Last Year: Often true  . Ran Out of Food in the Last Year: Often true  Transportation Needs:   . Lack of Transportation (Medical):   Marland Kitchen Lack of Transportation (Non-Medical):   Physical Activity:   . Days of Exercise per Week:   . Minutes of Exercise per Session:   Stress:   . Feeling of Stress :   Social Connections:   . Frequency of Communication with Friends and Family:   . Frequency of Social Gatherings with Friends and Family:   . Attends Religious Services:   . Active Member of Clubs or Organizations:   . Attends Archivist Meetings:   Marland Kitchen Marital Status:      Family History:  The patient's  family history includes Breast cancer in some other family members; CAD in her maternal grandmother; Diabetes in her maternal grandmother; Fibroids (age of onset: 19) in her sister; Heart disease in her maternal grandmother; Hypertension in her maternal grandmother; Other (age of onset: 49) in her mother.   ROS:   Please see the history of present illness.    ROS All other systems reviewed and are negative.   PHYSICAL EXAM:   VS:  BP (!) 180/98   Pulse 98   Ht 5\' 6"  (1.676 m)   Wt 204 lb (92.5 kg)   SpO2 98%   BMI 32.93 kg/m   Physical Exam  GEN: Obese, in no acute distress  Neck: no JVD, carotid bruits, or masses Cardiac:RRR; S4 no murmurs, rubs  Respiratory:  clear to auscultation bilaterally, normal work of breathing GI: soft, nontender, nondistended, + BS Ext: without cyanosis, clubbing, or edema, Good distal pulses bilaterally  Neuro:  Alert and Oriented x 3 Psych: euthymic mood, full affect  Wt Readings from Last 3 Encounters:  11/03/19 204 lb (92.5 kg)  10/26/19 (!) 205 lb (93 kg)  10/07/19 208 lb 6.4 oz (94.5 kg)      Studies/Labs  Reviewed:   EKG:  EKG is not ordered today.    Recent Labs: 04/30/2019: NT-Pro BNP 346; Platelets 216; TSH 1.470 05/04/2019: Hemoglobin 12.6; Hemoglobin 12.9 06/04/2019: ALT 23 09/20/2019: BUN 13; Creatinine, Ser 1.05; Potassium 3.3; Sodium 143   Lipid Panel    Component Value Date/Time   CHOL 176 06/04/2019 0825   TRIG 86 06/04/2019 0825   HDL 42 06/04/2019 0825   CHOLHDL 4.2 06/04/2019 0825   CHOLHDL 4 10/01/2018 1115   VLDL 22.6 10/01/2018 1115   LDLCALC 118 (H) 06/04/2019 0825   LDLDIRECT 152.0 03/30/2018 1604    Additional studies/ records that were reviewed today include:   Echo 03/12/18: Study Conclusions   - Left ventricle: The cavity size was normal. Wall thickness was    increased in a pattern of moderate LVH. Systolic function was    mildly reduced. The estimated ejection fraction was in the range    of 45% to 50%. Wall motion was normal; there were no regional    wall motion abnormalities. Features are consistent with a    pseudonormal left ventricular filling pattern, with concomitant    abnormal relaxation and increased filling pressure (grade 2    diastolic dysfunction).  - Mitral valve: There was mild regurgitation.  - Pulmonary arteries: Systolic pressure was mildly increased. PA    peak pressure: 33 mm Hg (S).    Lexiscan Myoview 04/20/18:  The left ventricular ejection fraction is moderately decreased (30-44%).  Nuclear stress EF: 41%.  There was no ST segment deviation noted during stress.  This is an intermediate risk study.   Abnormal, intermediate risk stress nuclear study with no ischemia or infarction; EF 41 with global hypokinesis and mild LVE; study intermediate risk due to reduced LV function.   Renal artery Doppler 10/29/18: Right: 1-59% stenosis of the right renal artery. Cyst(s) noted.  Left:  1-59% stenosis of the left renal artery.     LHC/RHC 05/04/19:  Dist LAD-1 lesion is 30% stenosed.  Dist LAD-2 lesion is 80% stenosed.  Dist  Cx lesion is 60% stenosed.   Findings:   Ao = 173/104 (134) LV =  181/25 RA =  10 RV =  41/13 PA =  42/10 (26) PCW = 18 Fick cardiac output/index = 5.8/2.9 PVR = 1.4 wu Ao sat = 98% PA sat = 73%, 73%   Assessment: 1. 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 LVEF 55-60% 3. Mildly elevated filling pressures with mild pulmonary venous HTN 4. Widely patent bilateral renal arteries. No evidence of AAA 5. Severe systemic HTN       ASSESSMENT:    1. Resistant hypertension   2. NICM (nonischemic cardiomyopathy) ([place])   3. Coronary artery disease involving native coronary artery of native heart without angina pectoris   4. History of syncope   5. Obstructive sleep apnea      PLAN:  In order of problems listed above:  Resistant hypertension being managed by Dr. Skeet Latch in hypertension clinic. BP still high. Will increase minoxidil 2.5 mg 2 twice daily and f/u with Dr. Oval Linsey   DOE-chronic and using O2 at night but feels like she needs it during the day. No heart failure on exam. F/u with Dr. Lucretia Field  Nonischemic cardiomyopathy felt secondary to resistant hypertension ejection fraction of 50% but normal on cardiac cath 05/2019  CAD nonobstructive with 60% distal left circumflex and 80% distal LAD on cath 05/04/2019-no angina  History of recurrent syncope felt secondary to narcolepsy  OSA starting to use CPAP again followed by Dr. Radford Pax  Medication Adjustments/Labs and Tests Ordered: Current medicines are reviewed at length with the patient today.  Concerns regarding medicines are outlined above.  Medication changes, Labs and Tests ordered today are listed in the Patient Instructions below. Patient Instructions  Medication Instructions:  Your physician has recommended you make the following change in your medication:   INCREASE Minoxidil 2.5mg  2 tablets twice daily  *If you need a refill on your cardiac medications before your next appointment, please call your  pharmacy*   Lab Work: None  If you have labs (blood work) drawn today and your tests are completely normal, you will receive your results only by: Marland Kitchen MyChart Message (if you have MyChart) OR . A paper copy in the mail If you have any lab test that is abnormal or we need to change your treatment, we will call you to review the results.   Testing/Procedures: None   Follow-Up: At [facility], you and your health needs are our priority.  As part of our continuing mission to provide you with exceptional heart care, we have created designated Provider Care Teams.  These Care Teams include your primary Cardiologist (physician) and Advanced Practice Providers (APPs -  Physician Assistants and Nurse Practitioners) who all work together to provide you with the care you need, when you need it.  We recommend signing up for the  patient portal called "MyChart".  Sign up information is provided on this After Visit Summary.  MyChart is used to connect with patients for Virtual Visits (Telemedicine).  Patients are able to view lab/test results, encounter notes, upcoming appointments, etc.  Non-urgent messages can be sent to your provider as well.   To learn more about what you can do with MyChart, go to [email or web address].    Your next appointment:   Keep your f/u appt with Dr Oval Linsey  The format for your next appointment:   In Person  Provider:   Skeet Latch, MD   Other Instructions None     Signed, Ermalinda Barrios, PA-C  11/03/2019 11:51 AM    [address] [place], [place], [place]  [id number] Phone: [phone]; Fax: [phone]

## 2019-11-03 ENCOUNTER — Ambulatory Visit (INDEPENDENT_AMBULATORY_CARE_PROVIDER_SITE_OTHER): Payer: Medicare Other | Admitting: Internal Medicine

## 2019-11-03 ENCOUNTER — Encounter: Payer: Self-pay | Admitting: Internal Medicine

## 2019-11-03 ENCOUNTER — Encounter: Payer: Self-pay | Admitting: Physician Assistant

## 2019-11-03 ENCOUNTER — Other Ambulatory Visit: Payer: Self-pay | Admitting: Cardiovascular Disease

## 2019-11-03 ENCOUNTER — Ambulatory Visit (INDEPENDENT_AMBULATORY_CARE_PROVIDER_SITE_OTHER): Payer: Medicare Other | Admitting: Physician Assistant

## 2019-11-03 ENCOUNTER — Other Ambulatory Visit: Payer: Self-pay

## 2019-11-03 VITALS — BP 180/98 | HR 98 | Ht 66.0 in | Wt 204.0 lb

## 2019-11-03 DIAGNOSIS — I2510 Atherosclerotic heart disease of native coronary artery without angina pectoris: Secondary | ICD-10-CM | POA: Diagnosis not present

## 2019-11-03 DIAGNOSIS — Z87898 Personal history of other specified conditions: Secondary | ICD-10-CM

## 2019-11-03 DIAGNOSIS — J45991 Cough variant asthma: Secondary | ICD-10-CM | POA: Diagnosis not present

## 2019-11-03 DIAGNOSIS — I10 Essential (primary) hypertension: Secondary | ICD-10-CM

## 2019-11-03 DIAGNOSIS — R058 Other specified cough: Secondary | ICD-10-CM

## 2019-11-03 DIAGNOSIS — R0600 Dyspnea, unspecified: Secondary | ICD-10-CM

## 2019-11-03 DIAGNOSIS — I428 Other cardiomyopathies: Secondary | ICD-10-CM

## 2019-11-03 DIAGNOSIS — R05 Cough: Secondary | ICD-10-CM

## 2019-11-03 DIAGNOSIS — G4733 Obstructive sleep apnea (adult) (pediatric): Secondary | ICD-10-CM

## 2019-11-03 DIAGNOSIS — R0609 Other forms of dyspnea: Secondary | ICD-10-CM

## 2019-11-03 MED ORDER — MINOXIDIL 2.5 MG PO TABS: 5.0000 mg | ORAL_TABLET | Freq: Two times a day (BID) | ORAL | 3 refills | Status: DC

## 2019-11-03 MED ORDER — GABAPENTIN 100 MG PO CAPS
100.0000 mg | ORAL_CAPSULE | Freq: Three times a day (TID) | ORAL | 2 refills | Status: DC
Start: 2019-11-03 — End: 2019-11-23

## 2019-11-03 MED ORDER — PROMETHAZINE-DM 6.25-15 MG/5ML PO SYRP
5.0000 mL | ORAL_SOLUTION | Freq: Four times a day (QID) | ORAL | 2 refills | Status: DC | PRN
Start: 2019-11-03 — End: 2020-07-03

## 2019-11-03 NOTE — Patient Instructions (Addendum)
For cough > phenergan dm  1 tsp every 4 hours as needed   Try albuterol (hfa fist day, neb 2nd and nothing the 3rd) 15 min before an activity that you know would make you short of breath and see if it makes any difference and if makes none then don't take it after activity unless you can't catch your breath.  gapentin 100 mg four times daily   See calendar for specific medication instructions and bring it back for each and every office visit for every healthcare provider you see.  Without it,  you may not receive the best quality medical care that we feel you deserve.  You will note that the calendar groups together  your maintenance  medications that are timed at particular times of the day.  Think of this as your checklist for what your doctor has instructed you to do until your next evaluation to see what benefit  there is  to staying on a consistent group of medications intended to keep you well.  The other group at the bottom is entirely up to you to use as you see fit  for specific symptoms that may arise between visits that require you to treat them on an as needed basis.  Think of this as your action plan or "what if" list.   Separating the top medications from the bottom group is fundamental to providing you adequate care going forward.     Call Dr [name] today if you have any hesitation about the moderna or pfizer asap  Please schedule a follow up office visit in 6 weeks, call sooner if needed

## 2019-11-03 NOTE — Patient Instructions (Addendum)
Medication Instructions:  Your physician has recommended you make the following change in your medication:   INCREASE Minoxidil 2.5mg  2 tablets twice daily  *If you need a refill on your cardiac medications before your next appointment, please call your pharmacy*   Lab Work: None  If you have labs (blood work) drawn today and your tests are completely normal, you will receive your results only by: Marland Kitchen MyChart Message (if you have MyChart) OR . A paper copy in the mail If you have any lab test that is abnormal or we need to change your treatment, we will call you to review the results.   Testing/Procedures: None   Follow-Up: At [facility], you and your health needs are our priority.  As part of our continuing mission to provide you with exceptional heart care, we have created designated Provider Care Teams.  These Care Teams include your primary Cardiologist (physician) and Advanced Practice Providers (APPs -  Physician Assistants and Nurse Practitioners) who all work together to provide you with the care you need, when you need it.  We recommend signing up for the patient portal called "MyChart".  Sign up information is provided on this After Visit Summary.  MyChart is used to connect with patients for Virtual Visits (Telemedicine).  Patients are able to view lab/test results, encounter notes, upcoming appointments, etc.  Non-urgent messages can be sent to your provider as well.   To learn more about what you can do with MyChart, go to [email or web address].    Your next appointment:   Keep your f/u appt with Dr Oval Linsey  The format for your next appointment:   In Person  Provider:   Skeet Latch, MD   Other Instructions None

## 2019-11-03 NOTE — Progress Notes (Signed)
Subjective:   Patient ID: Erica [name], female     DOB: 1976-01-01    MRN: [id number]    Brief patient profile:  62 yobf never smoker with asthma all her life much worse since around 07/2010 referred by Dr [name] to pulmonary clinic in Sept 2012 Hx of Breast Cancer 05/2008    History of Present Illness  12/18/2010 Initial pulmonary office eval on acei and  advair cc dtc asthma x 4 months but baseline = excess saba use "even when better" at least twice daily despite daily advair, worse at hs in fact  used saba 3 x last night.  On chemo /RT for breast ca but last rx was Jan 2012 well before asthma worse.  To er multiple times >  no better with singular added x 3 months. rec Stop Lisinopril Dulera 200 Take 2 puffs first thing in am and then another 2 puffs about 12 hours later.  Work on inhaler technique  Nexium 40 mg   Take 30-60 min before first meal of the day and Pepcid 20 mg one bedtime until return Only use your albuterol as a rescue medication to be used if you can't catch your breath by resting or doing a relaxed purse lip breathing pattern. The less you use it, the better it will work when you need it.  Prednisone 10 mg take  4 each am x 2 days,   2 each am x 2 days,  1 each am x2days and stop    01/03/2011 f/u ov/Karalee Hauter cc much better off ace and advair but still needing saba twice daily on avg. No  Purulent sputum. rec no change in rx/ rule of 2's reviewed     04/06/2013 NP Follow up and Med Review   We reviewed all her meds and organized them into a med calendar. She does have Medicaid but depends on samples. Did not bring all her meds with her today , says they are at home.  Says her breathing is the same since last ov w/ SOB, Has on /off wheezing, some chest heaviness, prod cough with yellow mucus. Good and bad days. Wears out easily .  Denies f/c/s, nausea, vomiting.  Did not stop singulair as recommended.  rec  Follow med calendar closely and bring to each visit > did not do   We are referring you for a CPSTwith spirometry before and after> never done   04/18/2014 NP Follow up and Med Review / NP  Patient returns for a two-week follow-up and medication review. We reviewed all her medications organize them into a medication count with patient education It appears that she is taking her medications correctly, however, she has over 30 medications and has had several changes recently. Last visit, her [name] was decreased to the 1102mcg says that her breathing was better on the 269mcg Patient had a chest x-ray last visit that showed no acute changes She underwent a CT sinus due to persistent cough , which was positive for sinusitis on the right She was treated with a ten-day course of Avelox, she has one dose left Does feel some better but still has cough . She denies any hemoptysis, orthopnea, PND or leg swelling  Patient has upcoming sleep study. Next week for suspected underlying sleep apnea with snoring and daytime sleepiness rec Refer to ENT for sinusitis and chronic cough > never saw one> f/u sinus CT neg 07/26/14  Increase Dulera 200 2 puffs Twice daily   Follow med calendar  closely and bring to each visit.     08/01/2014 f/u ov/Terrence Pizana re:  Asthma plus noct "gasping"  Chief Complaint  Patient presents with  . Follow-up    doing well.  no complaints.  not doing well x one year with noct / early  4am with cough/ gasping for air almost always goes to neb and then can't go back to sleep even 30 min later better Daytime in chest tightness resolves  completely while on prednisone last took one month prior to OV  And doesn't have it now but has exact same noct symptoms and overusing saba  Doe x mailbox and back to house out of breath and coughing  >>change dulera 200 to 100   08/30/2014 NP Follow up and Med review : Asthma Patient presents for a one-month follow-up We reviewed all her medications organize them into a medication count with patient education Patient  has had multiple medication changes recently She has been having difficulty with resistant hypertension and visual changes. Recently started on primidone and spironolactone . However, has not started this as of yet That she needs to go pick this up at the pharmacy. Neuro notes reviewed , has been referred to NS . B/p is elevated again at todays visit, discussed follow up with PCP for b/p management.  She tried to decrease her Dulera as recommended from last ov with Dr. Melvyn Novas    however, was unable to and is back on 200 dose .  Says overall her breathing is doing ok  No flare of cough or wheezing  Does have a lot of drainage in throat . Discussed starting on chlortrimeton.  rec Continue Dulera 200 2 puffs Twice daily  , rinse after use  Follow med calendar closely and bring to each visit.  Follow up with Primary MD concerning elevated blood pressure    10/05/2014 f/u ov/Nixon Kolton re: asthma with component of uacs/ no med calendar  Chief Complaint  Patient presents with  . Follow-up    Here to discuss meds- Dulera not on her drug formulary. Pt states breathing is doing well and denies any new co's today.  turns out using neb at least once every 24 h, esp between 2 and 4 am when wakes up with severe dry cough x months/ pred helps some but never eliminated noct cough rec symbicort 160 Take 2 puffs first thing in am and then another 2 puffs about 12 hours later.  Try taking the tramadol around 2 am to see if helps with the early am cough/ wheeze  See calendar for specific medication instructions and bring it back for each and every office visit  Separating the top medications from the bottom group is fundamental to providing you adequate care going forward.   Please schedule a follow up office visit in 2 weeks, sooner if needed with all medications in 2 separate bags    10/19/2014 f/u ov/Tarvaris Puglia re:  Asthma vs uacs/ not using med calendar correclty  Chief Complaint  Patient presents with  .  Follow-up    Cough has changed sicnce starting symbicort- prod cough with "dirty" sputum. She states she feels that she can tell when her PM dose is due b/c she starts feeling SOB. She is using proair once per day on average.   not using symbicort 160 correctly  rec Prednisone 10 mg take  4 each am x 2 days,  2 each am x 2 days,  1 each am x 2 days and stop  Bystolic 10 mg take twice daily until return  Take 2 chlortrimeton at bedtime and 2 more 4 hours later  Continue symbicort 160 Take 2 puffs first thing in am and then another 2 puffs about 12 hours later.  Only use your albuterol as rescue  Only use nebulizer if you try proventil first and it fails to work  Care everywhere notes reviewed from neurosurgery from [facility] , recent right frontal VP shunt  rec at d/c 01/25/16 rec d/c reglan/ hycodan for cough            07/07/2017  f/u ov/Shan Valdes re: chronic cough/ asthma/ no med calendar/ no meds / on ACEi but hasn't started  Chief Complaint  Patient presents with  . Follow-up    voiced no concerns   Dyspnea:  MMRC3 = can't walk 100 yards even at a slow pace at a flat grade s stopping due to sob   Cough: some/ 4 am is better / taking Tyl #4 around 11 am  Sleep: better but still sev times a week wakes up coughing around 4 am (was q noct previously)  SABA use:  2 x daily / neb 2-3 per week  Using afrin though not on med cal rec  I emphasized that nasal steroids have no immediate benefit  If not better >  Prednisone 10 mg take  4 each am x 2 days,   2 each am x 2 days,  1 each am x 2 days and stop  If still not better call [facility] at [phone] for CT sinus  See calendar for specific medication instructions and bring it back for each and every office visit for every healthcare provider you see.  Without it,  you may not receive the best quality medical care that we feel you deserve. Separating the top medications from the bottom group is fundamental to providing you adequate care going forward.   Please schedule a follow up office visit in 4 weeks, sooner if needed  with all medications /inhalers/ solutions in hand so we can verify exactly what you are taking. This includes all medications from all doctors and over the counters  - they should be separated like your med calendar in two bags:  Maintenance vs as needed    03/06/2018  f/u ov/Shandy Checo re: pseudoasthma Chief Complaint  Patient presents with  . Follow-up    Increased wheezing "when it got really cold".  She is using her albuterol inhaler and neb both a few x per wk.   Dyspnea:  MMRC3 = can't walk 100 yards even at a slow pace at a flat grade s stopping due to sob   Cough: dry cough on acei Sleeping: 30 degrees on cpap SABA use: a few times a week 02: just cpap   Overt hb despite dexilant  rec Stop lisinopril and asmanex  Start diovan 160 mg at bedtime  Clotrimazole troche up to 5 x daily and use spacer for your symbicort but delay the breath after you trigger    06/05/2018  f/u ov/Hayden Mabin re: psueudoasthma/ hbp/nonadherent with severe polypharmacy/ not using med calendar  Chief Complaint  Patient presents with  . Follow-up    Breathing has been worse over the past 2 wks. She is using her ventolin 8 x per day and neb about 3 x per day.   Baseline  Few times a week saba/ not able to use cpap more than an hour wakes gagging  With over HB dr Hilarie Fredrickson added Ranitidine helps  hb but not gagging did not improve then started 2 weeks prior to OV   stuffy nose/ cough with sltly brownish secretions sneezing / eyes running more need for albuterol but not helping  tyl #4 was being used 4 x weekly but not on list / now on bid and helps but never takes at hs   Dyspnea:  Ok at rest / across the room   Sleeping: poorly, not using tyl #4 or cpap SABA use: way over using now  rec zpak  Prednisone 10 mg take  4 each am x 2 days,   2 each am x 2 days,  1 each am x 2 days and stop  For cough > mucinex dm 1200 mg twice daily and supplement with  tylenol #4  Every 4 hours and use the flutter valve  Bystolic 20 mg now and daily  Tylenol #4 at bedtime to see if helps cpap See Tammy NP w/in  2 weeks with all your medications and inhalers and pill organizers, even over the counter meds, separated in two separate bags  Without this process, it simply isn't possible to assure that we are providing  your outpatient care  with  the attention to detail we feel you deserve.   If we cannot assure that you're getting that kind of care,  then we cannot manage your problem effectively from this clinic. Once you have seen Tammy and we are sure that we're all on the same page with your medication use she will arrange follow up with me.     01/25/2019 acute extended ov/Nobuo Nunziata re: recurrent cough x 2.5 weeks  Chief Complaint  Patient presents with  . Acute Visit    increased cough and some wheezing- relates to weather change- using albuterol inhaler 2 x per day on average and neb about once per wk.   dyspnea:  Ok if not coughing Cough:  Assoc with subjective wheeze/ sporadic, tan mucus  SABA :  Last neb one day prior,  None on day of ov Sleeping on cpap at 60 degrees chronically ( even before acute symptoms above) No prednisone in months  02  "they are working on getting g it for me to use with cpap" Worse hb since cough  rec zpak Prednisone 10 mg take  4 each am x 2 days,   2 each am x 2 days,  1 each am x 2 days and stop  See calendar for specific medication instructions and bring it back for each and every office visit     05/19/2019  f/u ov/Kendalynn Wideman re:  Cough x years dating back to childhood with dx asthma  Chief Complaint  Patient presents with  . Follow-up    Upper airway cough syndrome  Dyspnea:  MMRC3 = can't walk 100 yards even at a slow pace at a flat grade s stopping due to sob /parents do her shopping due to doe x 2 years  Cough: mostly dry  Sleeping: wakes up gasping air at hs can't tol cpap, ? bipap per Cards working on it  SABA  use:  Once a day when over does it, neb maybe once a week/ never prechallenges  02: awaiting resolution of cpap vs bipap per [place]  rec Work on inhaler technique:    Try albuterol 15 min before an activity that you know would make you short of breat Prednisone 10 mg take  4 each am x 2 days,   2 each am x 2 days,  1 each am x 2 days and stop  For runny nose try zyrtec 10 mg one at bedtime will cover 24 hour as needed      11/03/2019  f/u ov/Marciel Offenberger re: lifelong cough / not vaccinated for covid yet  Chief Complaint  Patient presents with  . Follow-up    reports baseline status today; c/o feeling tired and short of breath   Dyspnea:  MMRC3 = can't walk 100 yards even at a slow pace at a flat grade s stopping due to sob   Cough: mostly dry worse after supper and at hs  Sleeping: on bipap/ 2lpm feels sleeps well  SABA use: inhaler before shopping  and helps  02: 2lpm bedtime only    No obvious day to day or daytime variability or assoc excess/ purulent sputum or mucus plugs or hemoptysis or cp or chest tightness, subjective wheeze or overt sinus or hb symptoms.   Sleeping  without nocturnal  or early am exacerbation  of respiratory  c/o's or need for noct saba. Also denies any obvious fluctuation of symptoms with weather or environmental changes or other aggravating or alleviating factors except as outlined above   No unusual exposure hx or h/o childhood pna  or knowledge of premature birth.  Current Allergies, Complete Past Medical History, Past Surgical History, Family History, and Social History were reviewed in [employer] record.  ROS  The following are not active complaints unless bolded Hoarseness, sore throat, dysphagia, dental problems, itching, sneezing,  nasal congestion or discharge of excess mucus or purulent secretions, ear ache,   fever, chills, sweats, unintended wt loss or wt gain, classically pleuritic or exertional cp,  orthopnea pnd or arm/hand  swelling  or leg swelling, presyncope, palpitations, abdominal pain, anorexia, nausea, vomiting, diarrhea  or change in bowel habits or change in bladder habits, change in stools or change in urine, dysuria, hematuria,  rash, arthralgias, visual complaints, headache, numbness, weakness or ataxia or problems with walking or coordination,  change in mood or  memory.        Current Meds  Medication Sig  . acetaminophen-codeine (TYLENOL #4) 300-60 MG tablet  none in months   . acetaZOLAMIDE (DIAMOX) 250 MG tablet Take 4 tablets (1,000 mg total) by mouth 3 (three) times daily.  Marland Kitchen albuterol (PROVENTIL) (2.5 MG/3ML) 0.083% nebulizer solution USE 1 VIAL VIA NEBULIZER EVERY 4 HOURS AND AS NEEDED FOR WHEEZING OR SHORTNESS OF BREATH  . albuterol (VENTOLIN HFA) 108 (90 Base) MCG/ACT inhaler INHALE 2 PUFFS BY MOUTH EVERY 4 HOURS AS NEEDED FOR WHEEZE OR FOR SHORTNESS OF BREATH  . amitriptyline (ELAVIL) 100 MG tablet TAKE 1 TABLET BY MOUTH EVERYDAY AT BEDTIME  . Biotin 5000 MCG TABS Take 5,000 mg by mouth 2 (two) times daily.   . Cholecalciferol 1.25 MG (50000 UT) capsule Take 1 capsule (50,000 Units total) by mouth once a week.  . cloNIDine (CATAPRES - DOSED IN MG/24 HR) 0.3 mg/24hr patch PLACE 1 PATCH ONTO SKIN ONCE WEEKLY  . clopidogrel (PLAVIX) 75 MG tablet Take 1 tablet (75 mg total) by mouth daily.  . clotrimazole (MYCELEX) 10 MG troche Take 1 tablet (10 mg total) by mouth 5 (five) times daily.  Marland Kitchen dexlansoprazole (DEXILANT) 60 MG capsule Take 1 capsule (60 mg total) by mouth daily.  Marland Kitchen dextromethorphan-guaiFENesin (MUCINEX DM) 30-600 MG 12hr tablet Take 2 tablets by mouth 2 (two) times daily.   . Diclofenac Sodium 2 % SOLN Place 2 g onto the skin 2 (  two) times daily.  Marland Kitchen doxazosin (CARDURA) 8 MG tablet Take 1 tablet (8 mg total) by mouth daily.  . DULoxetine (CYMBALTA) 30 MG capsule Take 1 capsule (30 mg total) by mouth daily. With 60mg  for total of 90 mg  . DULoxetine (CYMBALTA) 60 MG capsule Take 1  capsule (60 mg total) by mouth daily. For total of 90 mg  . EPINEPHRINE 0.3 mg/0.3 mL IJ SOAJ injection INJECT 0.3 MLS INTO THE MUSCLE ONCE AS NEEDED FOR ANAPHYLAXIS  . ezetimibe (ZETIA) 10 MG tablet Take 1 tablet (10 mg total) by mouth daily.  . famotidine (PEPCID) 20 MG tablet TAKE 1-2 TABLETS (20-40 MG TOTAL) BY MOUTH AT BEDTIME.  . hydrALAZINE (APRESOLINE) 100 MG tablet TAKE 1 TABLET (100 MG TOTAL) BY MOUTH 3 (THREE) TIMES DAILY.  Marland Kitchen lidocaine-prilocaine (EMLA) cream Apply 1 application topically every 28 (twenty-eight) days. Apply 1 application to skin ever 28 days  . metoCLOPramide (REGLAN) 10 MG tablet Take 1 tablet (10 mg total) by mouth 4 (four) times daily.  . minoxidil (LONITEN) 2.5 MG tablet Take 2 tablets (5 mg total) by mouth 2 (two) times daily.  . montelukast (SINGULAIR) 10 MG tablet TAKE 1 TABLET BY MOUTH EVERYDAY AT BEDTIME  . Nebivolol HCl (BYSTOLIC) 20 MG TABS Take 1 tablet (20 mg total) by mouth daily.  Marland Kitchen NITROSTAT 0.4 MG SL tablet PLACE 1 TABLET UNDER TONGUE EVERY 5 MINUTES AS NEEDED FOR CHEST PAIN, MAX OF 3 TABLETS THEN CALL 911 IF PAIN PERSISTS  . Olmesartan-amLODIPine-HCTZ 40-10-25 MG TABS Take 1 tablet by mouth daily.  Marland Kitchen oxymetazoline (MUCINEX SINUS-MAX FULL FORCE) 0.05 % nasal spray Place 1 spray into both nostrils 2 (two) times daily.   . primidone (MYSOLINE) 50 MG tablet Take 0.5 tablets (25 mg total) by mouth at bedtime.  . promethazine (PHENERGAN) 25 MG tablet TAKE 1 TABLET (25 MG TOTAL) BY MOUTH 3 (THREE) TIMES DAILY AS NEEDED FOR NAUSEA OR VOMITING.  . ranolazine (RANEXA) 500 MG 12 hr tablet TAKE 1 TABLET BY MOUTH TWICE A DAY  . Respiratory Therapy Supplies (FLUTTER) DEVI Use as directed  . rosuvastatin (CRESTOR) 40 MG tablet Take 1 tablet (40 mg total) by mouth daily.  Marland Kitchen Spacer/Aero-Holding Chambers (AEROCHAMBER MV) inhaler Use as instructed  . spironolactone (ALDACTONE) 100 MG tablet TAKE 1 TABLET BY MOUTH EVERY DAY  . SYMBICORT 160-4.5 MCG/ACT inhaler INHALE 2  PUFFS INTO LUNGS 2 TIMES DAILY AS DIRECTED  . torsemide (DEMADEX) 20 MG tablet Take 20 mg by mouth daily.               Objective:  Physical Exam    amb obese bf nad / no spont coughing    11/03/2019     204  05/19/2019   207  01/25/2019   202 Wt 213  12/18/10 > 01/03/2011  214 > 01/31/2011  206 >  09/11/2012 209 >208 6/19 > 210 09/25/2012 > 11/20/2012 211 >208 12/07/12> 01/12/2013 209 > 02/09/2013 210 >207 04/06/2013 > 07/14/2013  213>212 07/28/2013 >204 01/26/2014> 04/04/2014 206  >205 04/18/2014 >  07/18/2014 201 >  08/01/14 201 >198 08/30/2014 > 10/05/14  195 > 10/19/2014 192 > 191 11/08/2014 > 02/10/2015  198 > 03/24/2015   198 > 06/29/2015   201 > 08/31/2015  204 > 11/17/2015  197 > 12/15/2015  188 > 03/22/2016   188 > 07/07/2017  198 > 03/06/2018  199  > 06/05/2018  200     Vital signs reviewed  11/03/2019  - Note at  rest 02 sats  98% on RA and bp 178/100      HEENT : pt wearing mask not removed for exam due to covid -19 concerns.    NECK :  without JVD/Nodes/TM/ nl carotid upstrokes bilaterally   LUNGS: no acc muscle use,  Nl contour chest which is clear to A and P bilaterally without cough on insp or exp maneuvers   CV:  RRR  no s3 or murmur or increase in P2, and no edema   ABD:  soft and nontender with nl inspiratory excursion in the supine position. No bruits or organomegaly appreciated, bowel sounds nl  MS:  Nl gait/ ext warm without deformities, calf tenderness, cyanosis or clubbing No obvious joint restrictions   SKIN: warm and dry without lesions    NEURO:  alert, approp, nl sensorium with  no motor or cerebellar deficits apparent.

## 2019-11-04 ENCOUNTER — Encounter: Payer: Self-pay | Admitting: Internal Medicine

## 2019-11-04 NOTE — Assessment & Plan Note (Signed)
Onset reported in childhood  - PFT's 01/31/2011 FEV1  1.76 (60% ) ratio 68 and no better p B2,  DLCO 70% -med review 09/25/2012 > Dulera drug assistance  paperwork given    - HFA 90% 11/20/2012 .  -Med calendar 12/07/12 , 04/06/2013 , 04/18/2014,, 08/30/2014 , 11/08/2014  - PFT's 01/12/2013  1.73 (65%)  Ratio 73 and no better p B2, DLCO 86% - trial off singulair 02/08/13 >>>did not stop  - 04/05/2014  trial of dulera 100 instead of 200 > worse  -04/18/2014 increase Dulera 200  - 08/01/2014 p extensive coaching HFA effectiveness =    90% - 08/01/2014  Walked RA x 2laps @ 185 ft each stopped due to cough > sob/ no desat moderate pace  - spirometry 08/01/2014  FEV1  1.87 (70%) ratio 78  fef 25-75 53%  - spirometry 02/10/2015  flattening of top portion of f/v loop only    - 02/10/2015   Walked RA  2 laps @ 185 ft each stopped due to cough > sob/ slow pace   -  NO 03/24/2015   =  16 - Methacholine challenge 04/04/2015   POS asthma = wheeze/sob but no coughing fits > added qvar 80 2bid 06/29/2015  - Allergy profile 11/17/2015 >  Eos 0.1 /  IgE  80 POS RAST  to grass, ragweed, cedar tree    - Spirometry 08/31/2015  FEV1 0.75 (28%)  Ratio 68  - FENO 11/17/2015  =  8  on symb 160 2bid and qvar 80 2bid  - FENO 03/22/2016  = 36 on symb 160/qvar 80 though not able to verify being used as directed  - 05/19/2019  After extensive coaching inhaler device,  effectiveness =    75% (short Ti)   Always difficult to sort out asthma component.  Reminded her: I spent extra time with pt today reviewing appropriate use of albuterol for prn use on exertion with the following points: 1) saba is for relief of sob that does not improve by walking a slower pace or resting but rather if the pt does not improve after trying this first. 2) If the pt is convinced, as many are, that saba helps recover from activity faster then it's easy to tell if this is the case by re-challenging : ie stop, take the inhaler, then p 5 minutes try the exact same  activity (intensity of workload) that just caused the symptoms and see if they are substantially diminished or not after saba 3) if there is an activity that reproducibly causes the symptoms, try the saba 15 min before the activity on alternate days   If in fact the saba really does help, then fine to continue to use it prn but advised may need to look closer at the maintenance regimen being used to achieve better control of airways disease with exertion.

## 2019-11-04 NOTE — Assessment & Plan Note (Signed)
Not optimally controlled on present regimen. I reviewed this with the patient and emphasized importance of follow-up with primary care.     

## 2019-11-04 NOTE — Assessment & Plan Note (Signed)
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 Uri/ poorly controlled GERD - flare mid Oct 2020 with uri / assoc secondary gerd - 11/03/2019 trial of gabapentin 100 qid   Would like to avoid further narcotics here so rec trial of gabapentin 100 qid if tolerates and use phenergan dm prn with goal of eliminating all cough that is of a cyclical/ habitual nature  Pt informed of the seriousness of COVID 19 infection as a direct risk to lung health  and safey and to close contacts and should continue to wear a facemask in public and minimize exposure to public locations but especially avoid any area or activity where non-close contacts are not observing distancing or wearing an appropriate face mask.  I strongly recommended she take either of the vaccines available through local drugstores based on updated information on millions of [place] treated with [place] products  which have proven both safe and  effective even against the new delta variant.              Each maintenance medication was reviewed in detail including most importantly the difference between maintenance and as needed and under what circumstances the prns are to be used. This was done in the context of a medication calendar review which provided the patient with a user-friendly unambiguous mechanism for medication administration and reconciliation and provides an action plan for all active problems. It is critical that this be shown to every doctor  for modification during the office visit if necessary so the patient can use it as a working document.      Total time for H and P, chart review, counseling, teaching device and generating customized AVS unique to this office  visit / charting = 20 min

## 2019-11-05 ENCOUNTER — Ambulatory Visit: Payer: Medicare Other | Admitting: Internal Medicine

## 2019-11-10 ENCOUNTER — Inpatient Hospital Stay: Payer: Medicare Other | Attending: Oncology

## 2019-11-10 ENCOUNTER — Other Ambulatory Visit: Payer: Self-pay

## 2019-11-10 VITALS — BP 187/107 | HR 95 | Resp 18

## 2019-11-10 DIAGNOSIS — Z5111 Encounter for antineoplastic chemotherapy: Secondary | ICD-10-CM | POA: Diagnosis present

## 2019-11-10 DIAGNOSIS — Z171 Estrogen receptor negative status [ER-]: Secondary | ICD-10-CM | POA: Insufficient documentation

## 2019-11-10 DIAGNOSIS — C50412 Malignant neoplasm of upper-outer quadrant of left female breast: Secondary | ICD-10-CM

## 2019-11-10 MED ORDER — GOSERELIN ACETATE 3.6 MG ~~LOC~~ IMPL
DRUG_IMPLANT | SUBCUTANEOUS | Status: AC
  Filled 2019-11-10: qty 3.6

## 2019-11-10 MED ORDER — GOSERELIN ACETATE 3.6 MG ~~LOC~~ IMPL
3.6000 mg | DRUG_IMPLANT | SUBCUTANEOUS | Status: DC
  Administered 2019-11-10: 3.6 mg via SUBCUTANEOUS

## 2019-11-10 NOTE — Patient Instructions (Signed)

## 2019-11-22 DIAGNOSIS — M7989 Other specified soft tissue disorders: Secondary | ICD-10-CM

## 2019-11-23 ENCOUNTER — Other Ambulatory Visit: Payer: Self-pay

## 2019-11-23 ENCOUNTER — Ambulatory Visit: Payer: Self-pay

## 2019-11-23 ENCOUNTER — Encounter: Payer: Self-pay | Admitting: Family Medicine

## 2019-11-23 ENCOUNTER — Ambulatory Visit (INDEPENDENT_AMBULATORY_CARE_PROVIDER_SITE_OTHER): Payer: Medicare Other | Admitting: Family Medicine

## 2019-11-23 VITALS — BP 200/90 | HR 103 | Ht 66.0 in | Wt 211.0 lb

## 2019-11-23 DIAGNOSIS — M79672 Pain in left foot: Secondary | ICD-10-CM | POA: Diagnosis not present

## 2019-11-23 DIAGNOSIS — M7989 Other specified soft tissue disorders: Secondary | ICD-10-CM

## 2019-11-23 DIAGNOSIS — I5032 Chronic diastolic (congestive) heart failure: Secondary | ICD-10-CM

## 2019-11-23 DIAGNOSIS — G620 Drug-induced polyneuropathy: Secondary | ICD-10-CM | POA: Diagnosis not present

## 2019-11-23 DIAGNOSIS — I10 Essential (primary) hypertension: Secondary | ICD-10-CM

## 2019-11-23 DIAGNOSIS — M79671 Pain in right foot: Secondary | ICD-10-CM

## 2019-11-23 DIAGNOSIS — T451X5A Adverse effect of antineoplastic and immunosuppressive drugs, initial encounter: Secondary | ICD-10-CM

## 2019-11-23 MED ORDER — GABAPENTIN 300 MG PO CAPS: 300.0000 mg | ORAL_CAPSULE | Freq: Three times a day (TID) | ORAL | 2 refills | Status: DC

## 2019-11-23 MED ORDER — TORSEMIDE 20 MG PO TABS
20.0000 mg | ORAL_TABLET | Freq: Two times a day (BID) | ORAL | 2 refills | Status: DC
Start: 2019-11-23 — End: 2019-12-08

## 2019-11-23 NOTE — Progress Notes (Signed)
Rito Ehrlich, am serving as a Education administrator for Dr. Lynne Leader.  Erica [name] is a 44 y.o. female who presents to [place] at [facility] today for B foot pain and swelling.  She was last seen by Dr. Tamala Julian on 06/08/19 for her R ankle.  Since her last visit w/ Dr. Tamala Julian, pt reports when she woke up this morning she felt like she almost had to crawl to the bathroom. Patient states both ankles are swollen started Saturday. Patient started taking torsemide 20 mg twice daily as opposed to once daily right ankle swelling went away but left one did not. Once she stood up and started walking the ankles swelled back up.   She has a medical history of severe difficulty controlling blood pressure.  She is on multiple classes of blood pressure medication under the care of her cardiologist still has persistently elevated hypertension.  She is a prescription for torsemide 20 mg that she typically takes once daily.  She has not tried using compression stockings yet.  She does have a history of neuropathy that she previously took Lyrica for.  She was eventually switched to gabapentin.  She takes gabapentin 100 mg 3 times daily for her chronic cough prescribed by Dr. Melvyn Novas.   Pertinent review of systems: No fevers or chills  Relevant historical information: Hypertension, diastolic heart failure failure, chemotherapy-induced peripheral neuropathy   Exam:  BP (!) 200/90 (BP Location: Left Arm, Patient Position: Sitting, Cuff Size: Normal)   Pulse (!) 103   Ht 5\' 6"  (1.676 m)   Wt 211 lb (95.7 kg)   SpO2 95%   BMI 34.06 kg/m  General: Well Developed, well nourished, and in no acute distress.   MSK: Bilateral lower legs 1+ pitting edema worse on the left than the right with increased calf diameter left worse than right.  Mildly tender palpation bilateral calves with no palpable cords.  Normal left ankle motion. No particular tender palpation left ankle.    Lab and Radiology  Results  Diagnostic Limited MSK Ultrasound of: Left ankle Hypoechoic fluid tracking subcutaneous tissue consistent with edema.  No significant joint effusion present. Impression: Ankle swelling due to edema not due to ankle effusion.     Assessment and Plan: 44 y.o. female with bilateral lower extremity pain and swelling left worse than right.  Likely worsening edema causing the pain and swelling.  Doubtful for isolated orthopedic issue.  Agree with increasing torsemide to twice daily.  Also agree with cardiology plan for vascular ultrasound to rule out DVT.  Unfortunately [facility] office can get her in soon.  We will try ordering vascular ultrasound for DVT assessment of the [facility] location for vascular surgery which may be more available.  Recommend also compression stockings.  This showed patient had a apply compression stockings more easily.  Also recommend Voltaren gel and have increased gabapentin.  Is possible some of her pain is neuropathy related.  Increase gabapentin 300 mg 3 times daily.  If not improving recheck and would consider steroid injection.   PDMP not reviewed this encounter. Orders Placed This Encounter  Procedures  . [place] LIMITED JOINT SPACE STRUCTURES LOW BILAT(NO LINKED CHARGES)    Order Specific Question:   Reason for Exam (SYMPTOM  OR DIAGNOSIS REQUIRED)    Answer:   B foot pain    Order Specific Question:   Preferred imaging location?    Answer:   [place]   Meds ordered this  encounter  Medications  . torsemide (DEMADEX) 20 MG tablet    Sig: Take 1 tablet (20 mg total) by mouth 2 (two) times daily.    Dispense:  60 tablet    Refill:  2  . gabapentin (NEURONTIN) 300 MG capsule    Sig: Take 1 capsule (300 mg total) by mouth 3 (three) times daily. One three times daily    Dispense:  90 capsule    Refill:  2     Discussed warning signs or symptoms. Please see discharge instructions. Patient expresses  understanding.   The above documentation has been reviewed and is accurate and complete Lynne Leader, M.D.

## 2019-11-23 NOTE — Patient Instructions (Addendum)
Thank you for coming in today. Try compression stockings.  Try voltaren gel over the counter.  Increase torsemide to twice daily.  We will try to get that blood clot test done at [facility] street vascular surgery office.  Follow up with cardiology.   Try increasing the gabapentin to 300 mg up to 3x daily.

## 2019-11-24 ENCOUNTER — Telehealth: Payer: Self-pay | Admitting: Cardiology

## 2019-11-24 MED ORDER — METOLAZONE 2.5 MG PO TABS: ORAL_TABLET | ORAL | 0 refills | Status: DC

## 2019-11-24 NOTE — Telephone Encounter (Signed)
Erica Latch, MD  Erica [name], CMA; Erica Filbert B, LPN 13 hours ago (7:91 PM)   Please order metolazone 2.5mg  to be taken one hour prior to her torsemide dose tomorrow and Thursday.   Erica C. Oval Linsey, MD, [facility]    Order for metolazone 2.5 mg to be taken 1 hour prior to her torsemide dose for today and Thursday only, was sent to the pts pharmacy.  Tried calling the pt to endorse these instructions, but she did not answer and voicemail is not set-up.  Will send her a mychart message with these instructions.

## 2019-11-24 NOTE — Telephone Encounter (Signed)
Spoke with the pt and endorsed to her metolazone recommendations as advised by Dr. Oval Linsey above.  Pt states she already took her torsemide this morning, so she will start the metolazone regimen tomorrow 8/26 and Friday 8/27.  Informed the pt that I already sent this to her confirmed pharmacy of choice. Pt also wanted to let Dr. Oval Linsey and Dr. Meda Coffee know, that Dr. Hulan Saas did a LE Venous US on her yesterday, and her results came back normal and no DVT or other blood clots noted on this test.  Informed the pt that I will make them both aware of her normal [place].  Pt verbalized understanding and agrees with this plan.

## 2019-11-24 NOTE — Telephone Encounter (Signed)
Patient states she is returning someone's call. No notes, labs, imaging or cv procedures to reference to. Please advise.

## 2019-11-24 NOTE — Telephone Encounter (Signed)
OK thank you 

## 2019-11-24 NOTE — Telephone Encounter (Signed)
Skeet Latch, MD  [name], CMA; Alvina Filbert B, LPN 13 hours ago (4:69 PM)   Please order metolazone 2.5mg  to be taken one hour prior to her torsemide dose tomorrow and Thursday.   [name], MD, [name] with the pt and endorsed to her metolazone recommendations as advised by Dr. Oval Linsey above.  Pt states she already took her torsemide this morning, so she will start the metolazone regimen tomorrow 8/26 and Friday 8/27.  Informed the pt that I already sent this to her confirmed pharmacy of choice. Pt also wanted to let Dr. Oval Linsey and Dr. Meda Coffee know, that Dr. Hulan Saas did a LE Venous US on her yesterday, and her results came back normal and no DVT or other blood clots noted on this test.  Informed the pt that I will make them both aware of her normal [place].  Pt verbalized understanding and agrees with this plan.

## 2019-11-25 ENCOUNTER — Encounter: Payer: Self-pay | Admitting: Family Medicine

## 2019-11-25 ENCOUNTER — Other Ambulatory Visit: Payer: Self-pay

## 2019-11-25 ENCOUNTER — Ambulatory Visit (HOSPITAL_COMMUNITY)
Admission: RE | Admit: 2019-11-25 | Discharge: 2019-11-25 | Disposition: A | Discharge status: Home / Self Care | Payer: Medicare Other | Source: Ambulatory Visit | Attending: Cardiology | Admitting: Cardiology

## 2019-11-25 DIAGNOSIS — M7989 Other specified soft tissue disorders: Secondary | ICD-10-CM | POA: Diagnosis not present

## 2019-11-26 ENCOUNTER — Other Ambulatory Visit: Payer: Self-pay | Admitting: Internal Medicine

## 2019-11-29 ENCOUNTER — Other Ambulatory Visit: Payer: Self-pay

## 2019-11-29 DIAGNOSIS — R079 Chest pain, unspecified: Secondary | ICD-10-CM

## 2019-11-29 DIAGNOSIS — E785 Hyperlipidemia, unspecified: Secondary | ICD-10-CM

## 2019-11-29 DIAGNOSIS — I2510 Atherosclerotic heart disease of native coronary artery without angina pectoris: Secondary | ICD-10-CM

## 2019-11-29 DIAGNOSIS — G8929 Other chronic pain: Secondary | ICD-10-CM

## 2019-11-29 MED ORDER — EZETIMIBE 10 MG PO TABS
10.0000 mg | ORAL_TABLET | Freq: Every day | ORAL | 3 refills | Status: DC
Start: 2019-11-29 — End: 2019-11-30

## 2019-11-29 MED ORDER — ROSUVASTATIN CALCIUM 40 MG PO TABS: 40.0000 mg | ORAL_TABLET | Freq: Every day | ORAL | 3 refills | Status: DC

## 2019-11-30 ENCOUNTER — Other Ambulatory Visit: Payer: Self-pay

## 2019-11-30 DIAGNOSIS — E785 Hyperlipidemia, unspecified: Secondary | ICD-10-CM

## 2019-11-30 DIAGNOSIS — I2510 Atherosclerotic heart disease of native coronary artery without angina pectoris: Secondary | ICD-10-CM

## 2019-11-30 DIAGNOSIS — G8929 Other chronic pain: Secondary | ICD-10-CM

## 2019-11-30 MED ORDER — EZETIMIBE 10 MG PO TABS
10.0000 mg | ORAL_TABLET | Freq: Every day | ORAL | 3 refills | Status: DC
Start: 2019-11-30 — End: 2020-10-23

## 2019-11-30 MED ORDER — ROSUVASTATIN CALCIUM 40 MG PO TABS: 40.0000 mg | ORAL_TABLET | Freq: Every day | ORAL | 3 refills | Status: DC

## 2019-12-02 ENCOUNTER — Ambulatory Visit (INDEPENDENT_AMBULATORY_CARE_PROVIDER_SITE_OTHER): Payer: Medicare Other | Admitting: Family Medicine

## 2019-12-02 ENCOUNTER — Ambulatory Visit: Payer: Self-pay

## 2019-12-02 ENCOUNTER — Other Ambulatory Visit: Payer: Self-pay

## 2019-12-02 ENCOUNTER — Encounter: Payer: Self-pay | Admitting: Family Medicine

## 2019-12-02 VITALS — BP 158/80 | HR 90 | Ht 66.0 in | Wt 203.6 lb

## 2019-12-02 DIAGNOSIS — M79672 Pain in left foot: Secondary | ICD-10-CM | POA: Diagnosis not present

## 2019-12-02 DIAGNOSIS — M79671 Pain in right foot: Secondary | ICD-10-CM | POA: Diagnosis not present

## 2019-12-02 NOTE — Progress Notes (Signed)
   I, [name], LAT, ATC, am serving as scribe for Dr. Lynne Leader.  Erica [name] is a 44 y.o. female who presents to [place] at [facility] today for f/u of B foot pain and swelling, L>R.  She was last seen by [name] on 11/23/19 and had her Gabapentin dosage increased.  She had a B venous doppler US on 11/25/19 that was negative for DVT.  She was advised to use compression stockings and Voltaren gel.  Since then, pt reports that her leg swelling has improved but her plantar foot pain has worsened.  Both feet are equally painful at this point and she notes the most pain in the morning when she gets out of bed.  She does report numbness/tingling in her feet but this is due to neuropathy.  Diagnostic testing: Venous doppler US- 11/25/19   Pertinent review of systems: No fevers or chills  Relevant historical information: Heart failure, chemotherapy-induced neuropathy   Exam:  BP (!) 158/80 (BP Location: Right Arm, Patient Position: Sitting, Cuff Size: Large)   Pulse 90   Ht 5\' 6"  (1.676 m)   Wt 203 lb 9.6 oz (92.4 kg)   SpO2 96%   BMI 32.86 kg/m  General: Well Developed, well nourished, and in no acute distress.   MSK: Right foot normal-appearing Tender palpation medial plantar calcaneus.  Mildly tender palpation fifth metatarsal head plantar. Normal foot and ankle motion. Left foot tender plantar calcaneus      Assessment and Plan: 44 y.o. female with bilateral foot pain.  Majority of pain is at plantar calcaneus due to plantar fasciitis.  Some of the lateral foot pain is I think due to limping and more weightbearing on the lateral aspect of her foot.  She may also have a component of pain due to neuropathy as well.  Discussed options.  If she can tolerate the pain as it is now best that is treatment consistent with plantar fasciitis for eccentric exercises and stretching icing and heel pads/heel cushioning.  Discussed that can proceed with plantar fascia  injection.  She would very much like to avoid injection if possible which is reasonable in my opinion.  Proceed with eccentric exercises and other treatment as above and recheck back with me as needed for plantar fascia injection.     Discussed warning signs or symptoms. Please see discharge instructions. Patient expresses understanding.   The above documentation has been reviewed and is accurate and complete Lynne Leader, M.D.

## 2019-12-02 NOTE — Patient Instructions (Signed)
Thank you for coming in today. I think you have plantar fasciitis.   Ice massage in the evening. Frozen cup of ice massage the heel.  Or a shallow pan of ice water heel soak.   Gel heel cups or insoles to add cushion.   Exercises.  Go from up to down slowly.   I can do a shot at any time.  I dont want to do a shot for this if I can avoid it.   Keep me updated.

## 2019-12-07 NOTE — Progress Notes (Signed)
Erica [name] OFFICE PROGRESS NOTE  [name], MD [place] [address]  DIAGNOSIS: Triple negative Breast Cancer   CURRENT THERAPY: Observation  INTERVAL HISTORY: Erica [name] 44 y.o. female returns to the clinic today for a follow up visit. The patient was last seen on September 2020. The patient has several chronic medical conditions and sees various specialist. She is having some concerns with generalized swelling and bilateral foot pain. She had a doppler ultrasound performed earlier this week which was negative for DVT. Her swelling is improved today compared to prior but she does report her feet swell. She is in the process of trying to find a new PCP and has questions regarding the pneumonia vaccine. She follows with cardiology for her heart failure and his nephrologist for his kidney disease.   Regarding her medical concerns that she follow [place] in the clinic for, she continues to receive goserelin for dysfunctional uterine bleeding. She has been on observation for her breast cancer since 2010. Her last mammogram was in August 2019 and she is overdue. The order had been placed but not scheduled. Otherwise, she denies any palpable masses, overlying breast skin changes, adenopathy, or nipple discharge or nipple inversion. She is here for evaluation and a 1 year follow up.    BREAST CANCER HISTORY: From the original intake note:  Erica [name] noticed a lump in her left breast sometime in August or September 2009.  Initially this was thought to possibly be "dry breast milk" but as the mass got bigger, she brought it to her physician's attention and was set up for mammography.  This was performed May 20, 2008 at [facility] and it showed a palpable 8.5 cm oval circumscribed mass in the left breast upper outer quadrant.  There were prominent left axillary lymph nodes noted as well.  An ultrasound showed this to be hypoechoic, with lobulated margins and  probable central necrosis measuring up to 7.3 cm on this modality.  Ultrasound of the left axilla demonstrated numerous prominent lymph nodes at the upper limit of normal, the largest measuring 1 cm with a normal appearing thin cortex and a normal cortex to halo ratio.    Because the patient had no insurance, she was referred to [facility] and biopsy was performed in [place] with the pathology there ([id number]) showing a poorly differentiated malignancy which was triple negative (ER 1%, PR 0%, HER-2/neu 0).  The axillary lymph nodes were not biopsied. With this information, the patient was presented at the Breast Multidisciplinary Conference and the feeling was that, after appropriate staging, the patient would be a good candidate for neoadjuvant treatment and possibly NSABP B40.     MEDICAL HISTORY: Past Medical History:  Diagnosis Date  . Allergy   . Anxiety   . Apnea, sleep 05/04/2014   AHI 31/hr now on CPAP at 12cm H2O  . Arthritis   . Asthma   . Asthma without status asthmaticus 01/19/2016  . Axillary hidradenitis suppurativa 08/01/2011  . Blood transfusion without reported diagnosis   . Breast cancer ([place]) 2010   a. locally advanced left breast carcinoma diagnosed in 2010 and treated with neoadjuvant chemotherapy with docetaxel and cyclophosphamide as well as paclitaxel. This was followed by radiation therapy which was completed in 2011.  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 CHF (congestive heart failure) ([place])   . Chronic diastolic CHF (congestive heart failure) ([place]) 06/23/2013  . Chronic kidney disease   . Chronic pain  in left foot 06/29/2018  . Cough variant asthma 09/27/2010   Followed in Pulmonary clinic/ [facility]/ [place]  Onset reported in childhood  - PFT's 01/31/2011 FEV1  1.76 (60% ) ratio 68 and no better p B2,  DLCO 70% -med review 09/25/2012 > [place] drug assistance  paperwork given    - HFA 90% 11/20/2012 .  -Med calendar 12/07/12 , 04/06/2013 ,  04/18/2014,, 08/30/2014 , 11/08/2014  - PFT's 01/12/2013  1.73 (65%)  Ratio 73 and no better p B2, DLCO 86% - trial off   . 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 Dyspnea on exertion    a. 01/2013 Lexi MV: EF 54%, no ischemia/infarct;  b. 05/2013 Echo: EF 60-65%, no rwma, Gr 2 DD;  b.   . Essential hypertension 10/09/2014   Estimated Creatinine Clearance: 82.4 mL/min (by C-G formula based on SCr of 0.98 mg/dL).  . Fatty liver   . Gastroparesis   . GERD (gastroesophageal reflux disease)   . Hyperglycemia 04/13/2011  . Hypertension   . Hypertensive heart disease 09/26/2015  . Hypertriglyceridemia without hypercholesterolemia 06/15/2015   Framingham risk score is 1%   . Idiopathic intracranial hypertension 10/21/2014  . Impaired glucose tolerance 04/13/2011  . Insomnia secondary to anxiety 04/05/2015  . Lymphadenitis, chronic    restricted LEFT extremity  . Malignant neoplasm of upper-outer quadrant of left breast in female, estrogen receptor negative ([place]) 01/07/2013   Stage III, Invasive Ductal, s/p partial Left Mastectomy/Lumpectomy (baseball sized) with Lymph Node dissection, had Chemo and RadRx    . 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 OSA (obstructive sleep apnea) 05/04/2014   AHI 31/hr now on CPAP at 12cm H2O  . Oxygen deficiency    Uses 02 in CPAP  . Persistent proteinuria 03/31/2018  . Personal history of chemotherapy 2010  . Personal history of radiation therapy 2010  . Sleep apnea   . Tibialis posterior tendinitis, left 07/15/2018   Injected in March 10, 2019  . Upper airway cough syndrome 10/09/2014   Onset around 2012 with assoc pseudowheeze MRI 05/18/15 > Paranasal sinuses are clear - 11/17/2015 rec increase h1 to 2 hs to help with noct cough  - Allergy profile 11/17/2015 >  Eos 0.1 /  IgE  80 POS RAST  to grass, ragweed, cedar tree  - d/c acei 03/06/2018  - flare end of Jan 2020 in setting of ? Uri/ poorly controlled GERD - flare mid Oct 2020 with uri / assoc secondary gerd  .  Vitamin D deficiency disease 03/31/2018    ALLERGIES:  is allergic to aspirin, doxycycline, kiwi extract, metoprolol, penicillins, strawberry extract, and sulfamethoxazole-trimethoprim.  MEDICATIONS:  Current Outpatient Medications  Medication Sig Dispense Refill  . acetaZOLAMIDE (DIAMOX) 250 MG tablet Take 4 tablets (1,000 mg total) by mouth 3 (three) times daily. 1080 tablet 3  . albuterol (PROVENTIL) (2.5 MG/3ML) 0.083% nebulizer solution USE 1 VIAL VIA NEBULIZER EVERY 4 HOURS AND AS NEEDED FOR WHEEZING OR SHORTNESS OF BREATH 75 mL 1  . albuterol (VENTOLIN HFA) 108 (90 Base) MCG/ACT inhaler INHALE 2 PUFFS BY MOUTH EVERY 4 HOURS AS NEEDED FOR WHEEZE OR FOR SHORTNESS OF BREATH 18 Inhaler 1  . amitriptyline (ELAVIL) 100 MG tablet TAKE 1 TABLET BY MOUTH EVERYDAY AT BEDTIME 90 tablet 0  . Biotin 5000 MCG TABS Take 5,000 mg by mouth 2 (two) times daily.     . Cholecalciferol 1.25 MG (50000 UT) capsule Take 1 capsule (50,000 Units total) by mouth once a week. 12  capsule 1  . cloNIDine (CATAPRES - DOSED IN MG/24 HR) 0.3 mg/24hr patch PLACE 1 PATCH ONTO SKIN ONCE WEEKLY 12 patch 1  . clopidogrel (PLAVIX) 75 MG tablet Take 1 tablet (75 mg total) by mouth daily. 90 tablet 3  . clotrimazole (MYCELEX) 10 MG troche Take 1 tablet (10 mg total) by mouth 5 (five) times daily. 30 tablet 11  . dexlansoprazole (DEXILANT) 60 MG capsule Take 1 capsule (60 mg total) by mouth daily. 90 capsule 3  . dextromethorphan-guaiFENesin (MUCINEX DM) 30-600 MG 12hr tablet Take 2 tablets by mouth 2 (two) times daily.     Marland Kitchen doxazosin (CARDURA) 8 MG tablet Take 1 tablet (8 mg total) by mouth daily. 90 tablet 3  . DULoxetine (CYMBALTA) 30 MG capsule Take 1 capsule (30 mg total) by mouth daily. With 72m for total of 90 mg 90 capsule 3  . DULoxetine (CYMBALTA) 60 MG capsule Take 1 capsule (60 mg total) by mouth daily. For total of 90 mg 90 capsule 3  . EPINEPHRINE 0.3 mg/0.3 mL IJ SOAJ injection INJECT 0.3 MLS INTO THE MUSCLE ONCE  AS NEEDED FOR ANAPHYLAXIS 2 Device 2  . ezetimibe (ZETIA) 10 MG tablet Take 1 tablet (10 mg total) by mouth daily. 90 tablet 3  . famotidine (PEPCID) 20 MG tablet Take 1-2 tablets (20-40 mg total) by mouth at bedtime. NEEDS OFFICE VISIT FOR FURTHER REFILLS 180 tablet 0  . gabapentin (NEURONTIN) 300 MG capsule Take 1 capsule (300 mg total) by mouth 3 (three) times daily. One three times daily 90 capsule 2  . hydrALAZINE (APRESOLINE) 100 MG tablet Take 100 mg by mouth 2 (two) times daily. TAKE 1 TABLET 3 TIMES A DAY    . lidocaine-prilocaine (EMLA) cream Apply 1 application topically every 28 (twenty-eight) days. Apply 1 application to skin ever 28 days    . metoCLOPramide (REGLAN) 10 MG tablet Take 1 tablet (10 mg total) by mouth 4 (four) times daily. 120 tablet 2  . minoxidil (LONITEN) 2.5 MG tablet Take 2 tablets (5 mg total) by mouth 2 (two) times daily. 120 tablet 3  . montelukast (SINGULAIR) 10 MG tablet TAKE 1 TABLET BY MOUTH EVERYDAY AT BEDTIME 90 tablet 1  . Nebivolol HCl (BYSTOLIC) 20 MG TABS Take 1 tablet (20 mg total) by mouth daily. 90 tablet 3  . NITROSTAT 0.4 MG SL tablet PLACE 1 TABLET UNDER TONGUE EVERY 5 MINUTES AS NEEDED FOR CHEST PAIN, MAX OF 3 TABLETS THEN CALL 911 IF PAIN PERSISTS 100 tablet 0  . Olmesartan-amLODIPine-HCTZ 40-10-25 MG TABS Take 1 tablet by mouth daily. 30 tablet 5  . oxymetazoline (MUCINEX SINUS-MAX FULL FORCE) 0.05 % nasal spray Place 1 spray into both nostrils 2 (two) times daily.     . Potassium Chloride ER 20 MEQ TBCR TAKE 1 TABLET BY MOUTH TWICE A DAY 180 tablet 0  . primidone (MYSOLINE) 50 MG tablet Take 0.5 tablets (25 mg total) by mouth at bedtime. 45 tablet 3  . promethazine (PHENERGAN) 25 MG suppository Place 25 mg rectally every 6 (six) hours as needed for nausea or vomiting.     . promethazine (PHENERGAN) 25 MG tablet TAKE 1 TABLET (25 MG TOTAL) BY MOUTH 3 (THREE) TIMES DAILY AS NEEDED FOR NAUSEA OR VOMITING. 270 tablet 0  .  promethazine-dextromethorphan (PROMETHAZINE-DM) 6.25-15 MG/5ML syrup Take 5 mLs by mouth 4 (four) times daily as needed for cough. 473 mL 2  . ranolazine (RANEXA) 500 MG 12 hr tablet TAKE 1 TABLET BY MOUTH  TWICE A DAY 180 tablet 2  . Respiratory Therapy Supplies (FLUTTER) DEVI Use as directed 1 each 0  . rosuvastatin (CRESTOR) 40 MG tablet Take 1 tablet (40 mg total) by mouth daily. 90 tablet 3  . Spacer/Aero-Holding Chambers (AEROCHAMBER MV) inhaler Use as instructed 1 each 0  . spironolactone (ALDACTONE) 100 MG tablet TAKE 1 TABLET BY MOUTH EVERY DAY 90 tablet 2  . SYMBICORT 160-4.5 MCG/ACT inhaler INHALE 2 PUFFS INTO LUNGS 2 TIMES DAILY AS DIRECTED 10.2 g 11  . torsemide (DEMADEX) 20 MG tablet Take 1 tablet (20 mg total) by mouth 2 (two) times daily. 60 tablet 2  . acetaminophen-codeine (TYLENOL #4) 300-60 MG tablet Take 1 tablet by mouth every 6 (six) hours as needed for pain. (Patient not taking: Reported on 12/09/2019) 30 tablet 0   No current facility-administered medications for this visit.   Facility-Administered Medications Ordered in Other Visits  Medication Dose Route Frequency Provider Last Rate Last Admin  . goserelin (ZOLADEX) injection 3.6 mg  3.6 mg Subcutaneous Q28 days Magrinat, Virgie Dad, MD   3.6 mg at 12/09/19 1120   GYNECOLOGIC HISTORY: updated January 2015 The patient is GX P4, first pregnancy to term at age 73.  She stopped having periods at the time of chemotherapy, and had had no periods for  2 years, then became pregnant in 2012. This was a surprise to her, and she was not aware of the pregnancy until the seventh month. She has not resumed normal menses, but has had some "spotting" on and off for the last several months.  SOCIAL HISTORY: updated September 2020 She has been a Dance movement psychotherapist in the past. She's currently disabled. She splits her time between [place] and [place], [place]. She's currently single. Erica [name] has four children,  currently between the ages  of 9 and 34.   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 BRAIN SURGERY     Shunt  . BREAST LUMPECTOMY Left 09/2008  . CARPAL TUNNEL RELEASE Left 2011  . LYMPH NODE DISSECTION Left 2010   breast; 2 wks after breast lumpectomy  . RIGHT/LEFT HEART CATH AND CORONARY ANGIOGRAPHY N/A 05/04/2019   Procedure: RIGHT/LEFT HEART CATH AND CORONARY ANGIOGRAPHY;  Surgeon: Jolaine Artist, MD;  Location: [facility];  Service: Cardiovascular;  Laterality: N/A;  Renal injection preformed   . TENDON RELEASE Right 2012   "hand"  . TUBAL LIGATION  05/11/2011   Procedure: POST PARTUM TUBAL LIGATION;  Surgeon: Emeterio Reeve, MD;  Location: [facility];  Service: Gynecology;  Laterality: Bilateral;  Induced for HTN    REVIEW OF SYSTEMS:   Review of Systems  Constitutional: Positive for fatigue. Negative for appetite change, chills, fever and unexpected weight change.  HENT: Negative for mouth sores, nosebleeds, sore throat and trouble swallowing.   Eyes: Negative for eye problems and icterus.  Respiratory: Negative for cough, hemoptysis, shortness of breath and wheezing.   Cardiovascular: Positive for bilateral lower extremity swelling (improved). Negative for chest pain. Gastrointestinal: Positive for nausea this past week. Negative for abdominal pain, constipation, diarrhea,  and vomiting.  Genitourinary: Negative for bladder incontinence, difficulty urinating, dysuria, frequency and hematuria.   Musculoskeletal: Negative for back pain, gait problem, neck pain and neck stiffness.  Skin: Negative for itching and rash.  Neurological: Negative for dizziness, extremity weakness, gait problem, headaches, light-headedness and seizures.  Hematological: Negative for adenopathy. Does not bruise/bleed easily.  Psychiatric/Behavioral: Negative  for confusion, depression and sleep disturbance. The patient is not nervous/anxious.     PHYSICAL  EXAMINATION:  Blood pressure (!) 164/85, pulse (!) 108, temperature 99.5 F (37.5 C), temperature source Tympanic, resp. rate 20, height _0  (1.676 m), weight 202 lb 3.2 oz (91.7 kg), SpO2 100 %.  ECOG PERFORMANCE STATUS: 1 - Symptomatic but completely ambulatory  Physical Exam  Constitutional: Oriented to person, place, and time and well-developed, well-nourished, and in no distress.  HENT:  Head: Normocephalic and atraumatic.  Mouth/Throat: Oropharynx is clear and moist. No oropharyngeal exudate.  Eyes: Conjunctivae are normal. Right eye exhibits no discharge. Left eye exhibits no discharge. No scleral icterus.  Neck: Normal range of motion. Neck supple.  Cardiovascular: Normal rate, regular rhythm, normal heart sounds and intact distal pulses.   Pulmonary/Chest: Effort normal and breath sounds normal. No respiratory distress. No wheezes. No rales.  Abdominal: Soft. Bowel sounds are normal. Exhibits no distension and no mass. There is no tenderness.  Musculoskeletal: Mild bilateral ankle swelling. Bilateral hands appeared to be mildly swollen. Normal range of motion.  Lymphadenopathy:    No cervical adenopathy.  Neurological: Alert and oriented to person, place, and time. Exhibits normal muscle tone. Gait normal. Coordination normal.  Skin: Skin is warm and dry. No rash noted. Not diaphoretic. No erythema. No pallor.  Psychiatric: Mood, memory and judgment normal.  Breast Exam: The right breast is benign. The left breast is status post lumpectomy and radiation. No axillary lymphadenopathy. No evidence of local recurrence.  Vitals reviewed.  LABORATORY DATA: Lab Results  Component Value Date   WBC 10.6 (H) 12/09/2019   HGB 11.6 (L) 12/09/2019   HCT 35.3 (L) 12/09/2019   MCV 87.4 12/09/2019   PLT 281 12/09/2019      Chemistry      Component Value Date/Time   NA 139 12/09/2019 0959   NA 143 09/20/2019 1412   NA 142 03/17/2017 0835   K 3.3 (L) 12/09/2019 0959   K 3.6  03/17/2017 0835   CL 99 12/09/2019 0959   CL 103 09/10/2012 1618   CO2 28 12/09/2019 0959   CO2 27 03/17/2017 0835   BUN 13 12/09/2019 0959   BUN 13 09/20/2019 1412   BUN 10.1 03/17/2017 0835   CREATININE 1.45 (H) 12/09/2019 0959   CREATININE 1.27 (H) 12/09/2018 1222   CREATININE 0.9 03/17/2017 0835      Component Value Date/Time   CALCIUM 9.3 12/09/2019 0959   CALCIUM 9.5 03/17/2017 0835   ALKPHOS 46 12/09/2019 0959   ALKPHOS 77 03/17/2017 0835   AST 23 12/09/2019 0959   AST 24 12/09/2018 1222   AST 17 03/17/2017 0835   ALT 22 12/09/2019 0959   ALT 27 12/09/2018 1222   ALT 24 03/17/2017 0835   BILITOT 0.6 12/09/2019 0959   BILITOT 0.6 06/04/2019 0825   BILITOT 0.4 12/09/2018 1222   BILITOT 0.50 03/17/2017 0835       RADIOGRAPHIC STUDIES:  [place] LIMITED JOINT SPACE STRUCTURES LOW BILAT(NO LINKED CHARGES)  Result Date: 11/24/2019 Diagnostic Limited MSK Ultrasound of: Left ankle Hypoechoic fluid tracking subcutaneous tissue consistent with edema.  No significant joint effusion present. Impression: Ankle swelling due to edema not due to ankle effusion.  VAS [place] LOWER EXTREMITY VENOUS (DVT)  Result Date: 11/25/2019  Lower Venous DVTStudy Indications: Pain, and Edema. Other Indications: CHF, DOE. Bilateral lower legs 1+ pitting edema worse on the  left than the right with increased calf diameter left worse                    than right. Mildly tender palpation bilateral calves with no                    palpable cords. Risk Factors: Obesity. Performing Technologist: [employer] BS, RVT, RDCS  Examination Guidelines: A complete evaluation includes B-mode imaging, spectral Doppler, color Doppler, and power Doppler as needed of all accessible portions of each vessel. Bilateral testing is considered an integral part of a complete examination. Limited examinations for reoccurring indications may be performed as noted. The reflux portion of the exam is performed with the  patient in reverse Trendelenburg.  +---------+---------------+---------+-----------+----------+--------------+ RIGHT    CompressibilityPhasicitySpontaneityPropertiesThrombus Aging +---------+---------------+---------+-----------+----------+--------------+ CFV      Full           Yes      Yes                                 +---------+---------------+---------+-----------+----------+--------------+ SFJ      Full           Yes      Yes                                 +---------+---------------+---------+-----------+----------+--------------+ FV Prox  Full           Yes      Yes                                 +---------+---------------+---------+-----------+----------+--------------+ FV Mid   Full           Yes      Yes                                 +---------+---------------+---------+-----------+----------+--------------+ FV DistalFull           Yes      Yes                                 +---------+---------------+---------+-----------+----------+--------------+ PFV      Full           Yes      Yes                                 +---------+---------------+---------+-----------+----------+--------------+ POP      Full           Yes      Yes                                 +---------+---------------+---------+-----------+----------+--------------+ PTV      Full           Yes      Yes                                 +---------+---------------+---------+-----------+----------+--------------+ PERO     Full  Yes      Yes                                 +---------+---------------+---------+-----------+----------+--------------+ Gastroc  Full           Yes      Yes                                 +---------+---------------+---------+-----------+----------+--------------+ GSV      Full           Yes      Yes                                 +---------+---------------+---------+-----------+----------+--------------+    +---------+---------------+---------+-----------+----------+--------------+ LEFT     CompressibilityPhasicitySpontaneityPropertiesThrombus Aging +---------+---------------+---------+-----------+----------+--------------+ CFV      Full           Yes      Yes                                 +---------+---------------+---------+-----------+----------+--------------+ SFJ      Full           Yes      Yes                                 +---------+---------------+---------+-----------+----------+--------------+ FV Prox  Full           Yes      Yes                                 +---------+---------------+---------+-----------+----------+--------------+ FV Mid   Full           Yes      Yes                                 +---------+---------------+---------+-----------+----------+--------------+ FV DistalFull           Yes      Yes                                 +---------+---------------+---------+-----------+----------+--------------+ PFV      Full           Yes      Yes                                 +---------+---------------+---------+-----------+----------+--------------+ POP      Full           Yes      Yes                                 +---------+---------------+---------+-----------+----------+--------------+ PTV      Full           Yes      Yes                                 +---------+---------------+---------+-----------+----------+--------------+  PERO     Full           Yes      Yes                                 +---------+---------------+---------+-----------+----------+--------------+ Gastroc  Full           Yes      Yes                                 +---------+---------------+---------+-----------+----------+--------------+ GSV      Full           Yes      Yes                                 +---------+---------------+---------+-----------+----------+--------------+     Summary: BILATERAL: - No evidence of deep  vein thrombosis seen in the lower extremities, bilaterally. - No evidence of superficial venous thrombosis in the lower extremities, bilaterally. -   *See table(s) above for measurements and observations. Electronically signed by Ena Dawley MD on 11/25/2019 at 1:01:04 PM.    Final      ASSESSMENT/PLAN:  44 y.o. [place] woman with history of locally advanced left breast carcinoma initially presenting in March 2010   (1) treated neoadjuvantly with 4 cycles of docetaxel/ cyclophosphamide.    (2) status post left lumpectomy and sentinel lymph node dissection in July 2010 for a ypT2 ypN1, stage IIB invasive ductal carcinoma, grade 3, triple negative, with MIB-1 of 88%.   (3) status post full axillary dissection and margin clearance in August 2010.   (4) status post 11 doses of weekly paclitaxel completed in December 2010   (5) radiation therapy completed in March 2011.  Now followed with observation alone.   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    She is now 11 years out from definitive surgery for breast cancer with no evidence of disease recurrence.    She is behind on her mammography. She has the number to [facility]. I advised her to please call them upon leaving the clinic today and schedule a mammogram at her earliest availability.   We will see her back for a follow up in 1 year, or sooner if her mammogram has concerning findings for recurrence.   She receives goserelin injections in the clinic monthly. I can arrange for more injection appointments. The patient is wondering if monthly lab appointments will be  performed with her injections. I will review this with Dr. [name] once he returns to the clinic for his recommendation.   Regarding her history of breast cancer, the patient seems to be doing well without any concerning evidence for recurrence. The patient has multiple other chronic medical conditions and many of her concerns today are related to her chronic health problems. I strongly encouraged the patient that the best resource to manage her concerns would be to follow up with her specialists.   She was mildly anemic on exam. The patient was supposed to have  a routine colonoscopy; however, due to her CHF, her colonoscopy needs to be performed inpatient and this had to be cancelled. I encouraged her to reach back out to her gastroenterologist to reschedule this.   I also spent some time counseling the patient on the importance of having a PCP to make sure she is up to date on health screenings and to help manage her chronic medical conditions. The patient had inquired about the pneumonia vaccine. The patient is in the process of trying to find a new PCP.   The patient was advised to call immediately if she has any concerning symptoms in the interval. The patient voices understanding of current disease status and treatment options and is in agreement with the current care plan. All questions were answered. The patient knows to call the clinic with any problems, questions or concerns. We can certainly see the patient much sooner if necessary    No orders of the defined types were placed in this encounter.    Daleon Willinger L Moussa Wiegand, PA-C 12/09/19

## 2019-12-08 ENCOUNTER — Other Ambulatory Visit: Payer: Self-pay

## 2019-12-08 ENCOUNTER — Telehealth: Payer: Self-pay | Admitting: Family Medicine

## 2019-12-08 ENCOUNTER — Ambulatory Visit (INDEPENDENT_AMBULATORY_CARE_PROVIDER_SITE_OTHER): Payer: Medicare Other | Admitting: Pharmacist Clinician (PhC)/ Clinical Pharmacy Specialist

## 2019-12-08 DIAGNOSIS — I10 Essential (primary) hypertension: Secondary | ICD-10-CM | POA: Diagnosis not present

## 2019-12-08 MED ORDER — TORSEMIDE 20 MG PO TABS
20.0000 mg | ORAL_TABLET | Freq: Two times a day (BID) | ORAL | 2 refills | Status: DC
Start: 2019-12-08 — End: 2020-02-21

## 2019-12-08 NOTE — Progress Notes (Signed)
12/08/2019 Erica [name] [birth year] [id number]   HPI:  Erica [name] is a 44 y.o. female patient of Erica [name], with a [place] below who presents today for hypertension clinic follow up.  She was seen by Erica. Oval [name] in early February and found to have a BP of 207/98.  At that time she was on 6 medications, including clonidine 0.3 mg patch, diltiazem 420 mg, hydralazine 976 mg tid, Bystolic 40 mg, spironolactone 100 mg and valsartan 320 mg, all at maximum doses.  Changes made over the past several months include dilt --> amlodipine and valsartan --> olmesartan.  Then the olmesartan, valsartan were combined with hctz to get 3 meds in one tablet in hopes of increasing compliance.  She is currently on 9 medications, as well as torsemide twice daily.  Six of those nine are at maximum doses.  There has been some issue over the past year regarding compliance, and when we went to check today, learned that [facility] [place] [facility] out [facility] [place].  They received all of her prescriptions in the last week of August, and have not filled anything to date.  Patients labs have almost always indicated a low potassium level despite being on 100 mg of spironolactone and 40 mg olmesartan.  Previously pharmacist at [employer] had been aware of our concerns and stated that he had also discussed the importance of taking her meds  Today she returns for a follow up.  She states she is feeling poorly, having severe pain in feet and hands.  She states that she has problems with edema in her legs and feet, and that when the edema decreases her feet then hurt too badly to even walk.  She was seen by Sports Medicine on Aug 24 and recommended to increase her gabapentin and start voltaren gel.  She states increased the gabapentin, but never got the gel.  She was also told that if symptoms did not resolve she should call to consider steroidal injection.  She has not called them as of yet.   Today there  was no obvious edema in her legs or ankles.  According to patient, worst of the edema is in her feet.  Unable to see as she was wearing tennis shoes laced up.  She states similar pains in her hands/elbows.  When asked about her pain on a 1-10 scale she replied "at least a 20".  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    Potential secondary causes of hypertension:  Renal artery stenosis - renal dopplers 09/2018 showed 1-59% stenosis on both left and right  Hyperaldosteronism - renin and aldosterone WNL 04/2019  Hyper/hypo thyroidism - TSH WNL   Sleep apnea - now using BiPAP (about 4 hrs per night)  Herbals/drugs - went thru extensive list, pt denies all except caffeine  Blood Pressure Goal:  130/80  Current Medications:  Bystolic 20 mg qd, Clonidine 0.3 mg patch (QSunday), hydralazine 100 mg tid,  spironolactone 100 mg qd, olmesartan/amlodipine/hctz 40/10/25 qd, minoxidil 5 mg bid, doxazosin 8 mg qhs  Family Hx: falther deceased, no connection with family; no concerns with mother, currently 8; 1 sister (whole) smokes but no health issues; 4 children all healthy at  25,22,12,8;   Social Hx:  No tobacco, no alcohol; 32 oz Pepsi daily; caffeine relieves pressure from shunt (pseudotumor cerebri)  Diet:  States pain has caused her to lose her appetite, nausea off/on all day, but still taking meds w/o difficulty  Exercise: difficult to exercise d/t breathing issues, pain in feet and hands  Home BP readings: no readings, states home readings [phone] systolic, 54'M diastolic  Intolerances: metoprolol caused hives  Labs:   6/21:  Na 143, K 3.3, Glu 110, BUN 13, SCr 1.05 GFR 74   1/21:  Na 142, K 3.6, Glu 103, BUN 9, SCr 1.26 GFR 59   Wt Readings from Last 3 Encounters:  12/08/19 199 lb 3.2 oz (90.4 kg)  12/02/19 203 lb 9.6 oz (92.4 kg)  11/23/19 211 lb (95.7 kg)    BP Readings from Last 3 Encounters:  12/08/19 (!) 192/90  12/02/19 (!) 158/80  11/23/19 (!) 200/90   Pulse Readings from Last 3 Encounters:  12/08/19 94  12/02/19 90  11/23/19 (!) 103    Current Outpatient Medications  Medication Sig Dispense Refill  . acetaZOLAMIDE (DIAMOX) 250 MG tablet Take 4 tablets (1,000 mg total) by mouth 3 (three) times daily. 1080 tablet 3  . albuterol (PROVENTIL) (2.5 MG/3ML) 0.083% nebulizer solution USE 1 VIAL VIA NEBULIZER EVERY 4 HOURS AND AS NEEDED FOR WHEEZING OR SHORTNESS OF BREATH 75 mL 1  . albuterol (VENTOLIN HFA) 108 (90 Base) MCG/ACT inhaler INHALE 2 PUFFS BY MOUTH EVERY 4 HOURS AS NEEDED FOR WHEEZE OR FOR SHORTNESS OF BREATH 18 Inhaler 1  . amitriptyline (ELAVIL) 100 MG tablet TAKE 1 TABLET BY MOUTH EVERYDAY AT BEDTIME 90 tablet 0  . Biotin 5000 MCG TABS Take 5,000 mg by mouth 2 (two) times daily.     . Cholecalciferol 1.25 MG (50000 UT) capsule Take 1 capsule (50,000 Units total) by mouth once a week. 12 capsule 1  . cloNIDine (CATAPRES - DOSED IN MG/24 HR) 0.3 mg/24hr patch PLACE 1 PATCH ONTO SKIN ONCE WEEKLY 12 patch 1  . clopidogrel (PLAVIX) 75 MG tablet Take 1 tablet (75 mg total) by mouth daily. 90 tablet 3  . clotrimazole (MYCELEX) 10 MG troche Take 1 tablet (10 mg total) by mouth 5 (five) times daily. 30 tablet 11  . dexlansoprazole (DEXILANT) 60 MG capsule Take 1 capsule (60 mg total) by mouth daily. 90 capsule 3  . dextromethorphan-guaiFENesin (MUCINEX DM) 30-600 MG 12hr tablet Take 2 tablets by mouth 2 (two) times daily.     Marland Kitchen doxazosin (CARDURA) 8 MG tablet Take 1 tablet (8 mg total) by mouth daily. 90 tablet 3  . DULoxetine (CYMBALTA) 30 MG capsule Take 1 capsule (30 mg total) by mouth daily. With 60mg  for total of 90 mg 90 capsule 3  . DULoxetine (CYMBALTA) 60 MG capsule Take 1 capsule (60 mg total) by mouth daily. For total of 90 mg 90 capsule 3  . EPINEPHRINE 0.3 mg/0.3 mL IJ SOAJ injection INJECT 0.3 MLS INTO THE MUSCLE ONCE AS  NEEDED FOR ANAPHYLAXIS 2 Device 2  . ezetimibe (ZETIA) 10 MG tablet Take 1 tablet (10 mg total) by mouth daily. 90 tablet 3  . famotidine (PEPCID) 20 MG tablet Take 1-2 tablets (20-40 mg total) by mouth at bedtime. NEEDS OFFICE VISIT FOR FURTHER REFILLS 180 tablet 0  . gabapentin (NEURONTIN) 300 MG capsule Take 1 capsule (300 mg total) by mouth 3 (three) times daily. One three times daily 90 capsule 2  . hydrALAZINE (APRESOLINE) 100 MG tablet Take 100 mg by mouth 2 (two) times daily. TAKE 1 TABLET 3 TIMES A DAY    . lidocaine-prilocaine (EMLA) cream  Apply 1 application topically every 28 (twenty-eight) days. Apply 1 application to skin ever 28 days    . metoCLOPramide (REGLAN) 10 MG tablet Take 1 tablet (10 mg total) by mouth 4 (four) times daily. 120 tablet 2  . minoxidil (LONITEN) 2.5 MG tablet Take 2 tablets (5 mg total) by mouth 2 (two) times daily. 120 tablet 3  . montelukast (SINGULAIR) 10 MG tablet TAKE 1 TABLET BY MOUTH EVERYDAY AT BEDTIME 90 tablet 1  . Nebivolol HCl (BYSTOLIC) 20 MG TABS Take 1 tablet (20 mg total) by mouth daily. 90 tablet 3  . NITROSTAT 0.4 MG SL tablet PLACE 1 TABLET UNDER TONGUE EVERY 5 MINUTES AS NEEDED FOR CHEST PAIN, MAX OF 3 TABLETS THEN CALL 911 IF PAIN PERSISTS 100 tablet 0  . Olmesartan-amLODIPine-HCTZ 40-10-25 MG TABS Take 1 tablet by mouth daily. 30 tablet 5  . oxymetazoline (MUCINEX SINUS-MAX FULL FORCE) 0.05 % nasal spray Place 1 spray into both nostrils 2 (two) times daily.     . Potassium Chloride ER 20 MEQ TBCR TAKE 1 TABLET BY MOUTH TWICE A DAY 180 tablet 0  . primidone (MYSOLINE) 50 MG tablet Take 0.5 tablets (25 mg total) by mouth at bedtime. 45 tablet 3  . promethazine (PHENERGAN) 25 MG suppository Place 25 mg rectally every 6 (six) hours as needed for nausea or vomiting.     . promethazine (PHENERGAN) 25 MG tablet TAKE 1 TABLET (25 MG TOTAL) BY MOUTH 3 (THREE) TIMES DAILY AS NEEDED FOR NAUSEA OR VOMITING. 270 tablet 0  . promethazine-dextromethorphan  (PROMETHAZINE-DM) 6.25-15 MG/5ML syrup Take 5 mLs by mouth 4 (four) times daily as needed for cough. 473 mL 2  . ranolazine (RANEXA) 500 MG 12 hr tablet TAKE 1 TABLET BY MOUTH TWICE A DAY 180 tablet 2  . Respiratory Therapy Supplies (FLUTTER) DEVI Use as directed 1 each 0  . rosuvastatin (CRESTOR) 40 MG tablet Take 1 tablet (40 mg total) by mouth daily. 90 tablet 3  . Spacer/Aero-Holding Chambers (AEROCHAMBER MV) inhaler Use as instructed 1 each 0  . spironolactone (ALDACTONE) 100 MG tablet TAKE 1 TABLET BY MOUTH EVERY DAY 90 tablet 2  . SYMBICORT 160-4.5 MCG/ACT inhaler INHALE 2 PUFFS INTO LUNGS 2 TIMES DAILY AS DIRECTED 10.2 g 11  . torsemide (DEMADEX) 20 MG tablet Take 1 tablet (20 mg total) by mouth 2 (two) times daily. 60 tablet 2  . acetaminophen-codeine (TYLENOL #4) 300-60 MG tablet Take 1 tablet by mouth every 6 (six) hours as needed for pain. (Patient not taking: Reported on 12/08/2019) 30 tablet 0   No current facility-administered medications for this visit.    Allergies  Allergen Reactions  . Aspirin Shortness Of Breath and Palpitations    Pt can take ibuprofen without reaction  . Doxycycline Anaphylaxis  . Kiwi Extract Itching and Swelling    Lip swelling  . Metoprolol Hives and Itching  . Penicillins Shortness Of Breath and Palpitations    Has patient had a PCN reaction causing immediate rash, facial/tongue/throat swelling, SOB or lightheadedness with hypotension: Yes Has patient had a PCN reaction causing severe rash involving mucus membranes or skin necrosis: Yes Has patient had a PCN reaction that required hospitalization Yes Has patient had a PCN reaction occurring within the last 10 years: No If all of the above answers are "NO", then may proceed with Cephalosporin use.  . Strawberry Extract Hives  . Sulfamethoxazole-Trimethoprim Anaphylaxis    Facial, throat and tongue swelling with difficulty swallowing.    Past  Medical History:  Diagnosis Date  . Allergy   .  Anxiety   . Apnea, sleep 05/04/2014   AHI 31/hr now on CPAP at 12cm H2O  . Arthritis   . Asthma   . Asthma without status asthmaticus 01/19/2016  . Axillary hidradenitis suppurativa 08/01/2011  . Blood transfusion without reported diagnosis   . Breast cancer ([place]) 2010   a. locally advanced left breast carcinoma diagnosed in 2010 and treated with neoadjuvant chemotherapy with docetaxel and cyclophosphamide as well as paclitaxel. This was followed by radiation therapy which was completed in 2011.  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 CHF (congestive heart failure) ([place])   . Chronic diastolic CHF (congestive heart failure) ([place]) 06/23/2013  . Chronic kidney disease   . Chronic pain in left foot 06/29/2018  . Cough variant asthma 09/27/2010   Followed in Pulmonary clinic/  Healthcare/ [place]  Onset reported in childhood  - PFT's 01/31/2011 FEV1  1.76 (60% ) ratio 68 and no better p B2,  DLCO 70% -med review 09/25/2012 > [place] drug assistance  paperwork given    - HFA 90% 11/20/2012 .  -Med calendar 12/07/12 , 04/06/2013 , 04/18/2014,, 08/30/2014 , 11/08/2014  - PFT's 01/12/2013  1.73 (65%)  Ratio 73 and no better p B2, DLCO 86% - trial off   . 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 Dyspnea on exertion    a. 01/2013 Lexi MV: EF 54%, no ischemia/infarct;  b. 05/2013 Echo: EF 60-65%, no rwma, Gr 2 DD;  b.   . Essential hypertension 10/09/2014   Estimated Creatinine Clearance: 82.4 mL/min (by C-G formula based on SCr of 0.98 mg/dL).  . Fatty liver   . Gastroparesis   . GERD (gastroesophageal reflux disease)   . Hyperglycemia 04/13/2011  . Hypertension   . Hypertensive heart disease 09/26/2015  . Hypertriglyceridemia without hypercholesterolemia 06/15/2015   F[place]ingham risk score is 1%   . Idiopathic intracranial hypertension 10/21/2014  . Impaired glucose tolerance 04/13/2011  . Insomnia secondary to anxiety 04/05/2015  . Lymphadenitis, chronic    restricted LEFT extremity  .  Malignant neoplasm of upper-outer quadrant of left breast in female, estrogen receptor negative ([place]) 01/07/2013   Stage III, Invasive Ductal, s/p partial Left Mastectomy/Lumpectomy (baseball sized) with Lymph Node dissection, had Chemo and RadRx    . 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 OSA (obstructive sleep apnea) 05/04/2014   AHI 31/hr now on CPAP at 12cm H2O  . Oxygen deficiency    Uses 02 in CPAP  . Persistent proteinuria 03/31/2018  . Personal history of chemotherapy 2010  . Personal history of radiation therapy 2010  . Sleep apnea   . Tibialis posterior tendinitis, left 07/15/2018   Injected in March 10, 2019  . Upper airway cough syndrome 10/09/2014   Onset around 2012 with assoc pseudowheeze MRI 05/18/15 > Paranasal sinuses are clear - 11/17/2015 rec increase h1 to 2 hs to help with noct cough  - Allergy profile 11/17/2015 >  Eos 0.1 /  IgE  80 POS RAST  to grass, ragweed, cedar tree  - d/c acei 03/06/2018  - flare end of Jan 2020 in setting of ? Uri/ poorly controlled GERD - flare mid Oct 2020 with uri / assoc secondary gerd  . Vitamin D deficiency disease 03/31/2018    Blood pressure (!) 192/90, pulse 94, resp. rate 16, height 5\' 6"  (1.676 m), weight 199 lb 3.2 oz (90.4 kg), SpO2 96 %.  Essential hypertension Patient with ongoing  out of control hypertension.  She is currently on 8 medications, with 5 of those being at maximum doses.  Doxazosin, minoxidil and Bystolic can still be theoretically increased.  Today she complains of severe pain in her feet and hands.  With her elevated pain levels, I don't feel comfortable increasing her BP medications.   (Would have considered increasing doxazosin from 8 mg qd to 8 mg bid).  Stressed that patient needs to reach out to Erica. Georgina Snell and schedule possible steroid injection to her foot.  We cannot do much for her BP if her pain is that high.    Tommy Medal PharmD CPP [facility] [address] [place], [place] [address] [phone]

## 2019-12-08 NOTE — Patient Instructions (Signed)
We will have Dr. Blenda Mounts nurse contact you regarding a follow up appointment   Check your blood pressure at home daily and keep record of the readings.  Take your BP meds as follows:   Continue with all current medication  Try acetaminophen (Tylenol) 1,000 mg up to twice daily to see if that helps with pain  Schedule appointment with [name] to get steroid injection to help with foot pain.  Ask about Voltaren Gel  Bring all of your meds, your BP cuff and your record of home blood pressures to your next appointment.  Exercise as you're able, try to walk approximately 30 minutes per day.  Keep salt intake to a minimum, especially watch canned and prepared boxed foods.  Eat more fresh fruits and vegetables and fewer canned items.  Avoid eating in fast food restaurants.    HOW TO TAKE YOUR BLOOD PRESSURE: . Rest 5 minutes before taking your blood pressure. .  Don't smoke or drink caffeinated beverages for at least 30 minutes before. . Take your blood pressure before (not after) you eat. . Sit comfortably with your back supported and both feet on the floor (don't cross your legs). . Elevate your arm to heart level on a table or a desk. . Use the proper sized cuff. It should fit smoothly and snugly around your bare upper arm. There should be enough room to slip a fingertip under the cuff. The bottom edge of the cuff should be 1 inch above the crease of the elbow. . Ideally, take 3 measurements at one sitting and record the average.

## 2019-12-08 NOTE — Telephone Encounter (Signed)
At visit in August I increased her torsemide dose for leg swelling.  [facility] [place]  I received a faxed request [facility] [place]  We will go ahead and send in however future refills should come from PCP.

## 2019-12-08 NOTE — Assessment & Plan Note (Signed)
Patient with ongoing out of control hypertension.  She is currently on 8 medications, with 5 of those being at maximum doses.  Doxazosin, minoxidil and Bystolic can still be theoretically increased.  Today she complains of severe pain in her feet and hands.  With her elevated pain levels, I don't feel comfortable increasing her BP medications.   (Would have considered increasing doxazosin from 8 mg qd to 8 mg bid).  Stressed that patient needs to reach out to [name] and schedule possible steroid injection to her foot.  We cannot do much for her BP if her pain is that high.

## 2019-12-09 ENCOUNTER — Inpatient Hospital Stay: Payer: Medicare Other

## 2019-12-09 ENCOUNTER — Inpatient Hospital Stay: Payer: Medicare Other | Admitting: Nurse Practitioner

## 2019-12-09 ENCOUNTER — Other Ambulatory Visit: Payer: Self-pay

## 2019-12-09 ENCOUNTER — Inpatient Hospital Stay (HOSPITAL_BASED_OUTPATIENT_CLINIC_OR_DEPARTMENT_OTHER): Payer: Medicare Other | Admitting: Physician Assistant

## 2019-12-09 ENCOUNTER — Inpatient Hospital Stay: Payer: Medicare Other | Attending: Oncology

## 2019-12-09 VITALS — BP 164/85 | HR 108 | Temp 99.5°F | Resp 20 | Ht 66.0 in | Wt 202.2 lb

## 2019-12-09 DIAGNOSIS — Z171 Estrogen receptor negative status [ER-]: Secondary | ICD-10-CM | POA: Diagnosis not present

## 2019-12-09 DIAGNOSIS — K3184 Gastroparesis: Secondary | ICD-10-CM | POA: Diagnosis not present

## 2019-12-09 DIAGNOSIS — G932 Benign intracranial hypertension: Secondary | ICD-10-CM | POA: Insufficient documentation

## 2019-12-09 DIAGNOSIS — F419 Anxiety disorder, unspecified: Secondary | ICD-10-CM | POA: Insufficient documentation

## 2019-12-09 DIAGNOSIS — C50412 Malignant neoplasm of upper-outer quadrant of left female breast: Secondary | ICD-10-CM | POA: Diagnosis not present

## 2019-12-09 DIAGNOSIS — K219 Gastro-esophageal reflux disease without esophagitis: Secondary | ICD-10-CM | POA: Diagnosis not present

## 2019-12-09 DIAGNOSIS — I10 Essential (primary) hypertension: Secondary | ICD-10-CM | POA: Diagnosis not present

## 2019-12-09 DIAGNOSIS — Z923 Personal history of irradiation: Secondary | ICD-10-CM | POA: Insufficient documentation

## 2019-12-09 DIAGNOSIS — Z79899 Other long term (current) drug therapy: Secondary | ICD-10-CM | POA: Insufficient documentation

## 2019-12-09 DIAGNOSIS — M1990 Unspecified osteoarthritis, unspecified site: Secondary | ICD-10-CM | POA: Insufficient documentation

## 2019-12-09 DIAGNOSIS — D649 Anemia, unspecified: Secondary | ICD-10-CM | POA: Insufficient documentation

## 2019-12-09 DIAGNOSIS — G4733 Obstructive sleep apnea (adult) (pediatric): Secondary | ICD-10-CM | POA: Insufficient documentation

## 2019-12-09 DIAGNOSIS — N938 Other specified abnormal uterine and vaginal bleeding: Secondary | ICD-10-CM | POA: Insufficient documentation

## 2019-12-09 DIAGNOSIS — E781 Pure hyperglyceridemia: Secondary | ICD-10-CM | POA: Diagnosis not present

## 2019-12-09 DIAGNOSIS — Z9221 Personal history of antineoplastic chemotherapy: Secondary | ICD-10-CM | POA: Diagnosis not present

## 2019-12-09 DIAGNOSIS — J45909 Unspecified asthma, uncomplicated: Secondary | ICD-10-CM | POA: Insufficient documentation

## 2019-12-09 DIAGNOSIS — F329 Major depressive disorder, single episode, unspecified: Secondary | ICD-10-CM | POA: Insufficient documentation

## 2019-12-09 DIAGNOSIS — Z79818 Long term (current) use of other agents affecting estrogen receptors and estrogen levels: Secondary | ICD-10-CM | POA: Insufficient documentation

## 2019-12-09 DIAGNOSIS — E559 Vitamin D deficiency, unspecified: Secondary | ICD-10-CM | POA: Diagnosis not present

## 2019-12-09 DIAGNOSIS — I130 Hypertensive heart and chronic kidney disease with heart failure and stage 1 through stage 4 chronic kidney disease, or unspecified chronic kidney disease: Secondary | ICD-10-CM | POA: Diagnosis not present

## 2019-12-09 LAB — CBC WITH DIFFERENTIAL/PLATELET
Abs Immature Granulocytes: 0.03 10*3/uL (ref 0.00–0.07)
Basophils Absolute: 0 10*3/uL (ref 0.0–0.1)
Basophils Relative: 0 %
Eosinophils Absolute: 0.1 10*3/uL (ref 0.0–0.5)
Eosinophils Relative: 1 %
HCT: 35.3 % — ABNORMAL LOW (ref 36.0–46.0)
Hemoglobin: 11.6 g/dL — ABNORMAL LOW (ref 12.0–15.0)
Immature Granulocytes: 0 %
Lymphocytes Relative: 19 %
Lymphs Abs: 2.1 10*3/uL (ref 0.7–4.0)
MCH: 28.7 pg (ref 26.0–34.0)
MCHC: 32.9 g/dL (ref 30.0–36.0)
MCV: 87.4 fL (ref 80.0–100.0)
Monocytes Absolute: 0.6 10*3/uL (ref 0.1–1.0)
Monocytes Relative: 6 %
Neutro Abs: 7.8 10*3/uL — ABNORMAL HIGH (ref 1.7–7.7)
Neutrophils Relative %: 74 %
Platelets: 281 10*3/uL (ref 150–400)
RBC: 4.04 MIL/uL (ref 3.87–5.11)
RDW: 13.2 % (ref 11.5–15.5)
WBC: 10.6 10*3/uL — ABNORMAL HIGH (ref 4.0–10.5)
nRBC: 0 % (ref 0.0–0.2)

## 2019-12-09 LAB — COMPREHENSIVE METABOLIC PANEL
ALT: 22 U/L (ref 0–44)
AST: 23 U/L (ref 15–41)
Albumin: 3.1 g/dL — ABNORMAL LOW (ref 3.5–5.0)
Alkaline Phosphatase: 46 U/L (ref 38–126)
Anion gap: 12 (ref 5–15)
BUN: 13 mg/dL (ref 6–20)
CO2: 28 mmol/L (ref 22–32)
Calcium: 9.3 mg/dL (ref 8.9–10.3)
Chloride: 99 mmol/L (ref 98–111)
Creatinine, Ser: 1.45 mg/dL — ABNORMAL HIGH (ref 0.44–1.00)
GFR calc Af Amer: 50 mL/min — ABNORMAL LOW (ref >60)
GFR calc non Af Amer: 43 mL/min — ABNORMAL LOW (ref >60)
Glucose, Bld: 121 mg/dL — ABNORMAL HIGH (ref 70–99)
Potassium: 3.3 mmol/L — ABNORMAL LOW (ref 3.5–5.1)
Sodium: 139 mmol/L (ref 135–145)
Total Bilirubin: 0.6 mg/dL (ref 0.3–1.2)
Total Protein: 8.4 g/dL — ABNORMAL HIGH (ref 6.5–8.1)

## 2019-12-09 MED ORDER — GOSERELIN ACETATE 3.6 MG ~~LOC~~ IMPL
3.6000 mg | DRUG_IMPLANT | SUBCUTANEOUS | Status: DC
  Administered 2019-12-09: 3.6 mg via SUBCUTANEOUS

## 2019-12-09 MED ORDER — GOSERELIN ACETATE 3.6 MG ~~LOC~~ IMPL
DRUG_IMPLANT | SUBCUTANEOUS | Status: AC
  Filled 2019-12-09: qty 3.6

## 2019-12-09 NOTE — Patient Instructions (Signed)

## 2019-12-10 ENCOUNTER — Telehealth: Payer: Self-pay | Admitting: Physician Assistant

## 2019-12-10 NOTE — Telephone Encounter (Signed)
Scheduled per los. Called and spoke with patient. Confirmed appt 

## 2019-12-13 ENCOUNTER — Other Ambulatory Visit: Payer: Self-pay | Admitting: Oncology

## 2019-12-14 MED ORDER — AZITHROMYCIN 250 MG PO TABS: 250.0000 mg | ORAL_TABLET | ORAL | 0 refills | Status: DC

## 2019-12-14 MED ORDER — PREDNISONE 10 MG PO TABS: ORAL_TABLET | ORAL | 0 refills | Status: DC

## 2019-12-14 NOTE — Telephone Encounter (Signed)
Glad to hear that she has had her Covid vaccines and a recent negative Covid test.  Also a great job will get her flu shot  Sorry to hear that she has sickness.  For acute bronchitis can have a Z-Pak #1 take as directed  Prednisone 4 tabs for 2 days, then 3 tabs for 2 days, 2 tabs for 2 days, then 1 tab for 2 days, then stop #20  No refills   Please contact office for sooner follow up if symptoms do not improve or worsen or seek emergency care

## 2019-12-14 NOTE — Telephone Encounter (Signed)
Erica [name]- pt is emailing c/o cough with tan sputum, increased cough in general She states that her chest feels sore from cough,esp in the am's  She is not having SOB or fever  Has had her covid vaccines and also a negative covid test as well as already having her flu shot  She is asking for pred and zpack  Please advise as Dr Melvyn Novas not in office today, thanks

## 2020-01-06 ENCOUNTER — Inpatient Hospital Stay: Payer: Medicare Other | Attending: Oncology

## 2020-01-06 ENCOUNTER — Other Ambulatory Visit: Payer: Self-pay | Admitting: *Deleted

## 2020-01-06 ENCOUNTER — Other Ambulatory Visit: Payer: Self-pay

## 2020-01-06 VITALS — BP 191/96 | HR 92 | Temp 99.3°F | Resp 20

## 2020-01-06 DIAGNOSIS — I10 Essential (primary) hypertension: Secondary | ICD-10-CM | POA: Insufficient documentation

## 2020-01-06 DIAGNOSIS — Z79899 Other long term (current) drug therapy: Secondary | ICD-10-CM | POA: Insufficient documentation

## 2020-01-06 DIAGNOSIS — Z79818 Long term (current) use of other agents affecting estrogen receptors and estrogen levels: Secondary | ICD-10-CM | POA: Insufficient documentation

## 2020-01-06 DIAGNOSIS — Z170 Estrogen receptor positive status [ER+]: Secondary | ICD-10-CM | POA: Insufficient documentation

## 2020-01-06 DIAGNOSIS — C50412 Malignant neoplasm of upper-outer quadrant of left female breast: Secondary | ICD-10-CM

## 2020-01-06 MED ORDER — FUROSEMIDE 20 MG PO TABS
20.0000 mg | ORAL_TABLET | Freq: Once | ORAL | Status: AC
  Administered 2020-01-06: 20 mg via ORAL

## 2020-01-06 MED ORDER — FUROSEMIDE 20 MG PO TABS
ORAL_TABLET | ORAL | Status: AC
  Filled 2020-01-06: qty 1

## 2020-01-06 MED ORDER — GOSERELIN ACETATE 3.6 MG ~~LOC~~ IMPL
3.6000 mg | DRUG_IMPLANT | SUBCUTANEOUS | Status: DC
  Administered 2020-01-06: 3.6 mg via SUBCUTANEOUS

## 2020-01-06 MED ORDER — GOSERELIN ACETATE 3.6 MG ~~LOC~~ IMPL
DRUG_IMPLANT | SUBCUTANEOUS | Status: AC
  Filled 2020-01-06: qty 3.6

## 2020-01-06 NOTE — Progress Notes (Signed)
Pt arrived to [facility] for Zoladex inj. B/P is 191/96. Pt states she took medication for HTN. Dr [name] aware and states it is OK to treat, and should receive Lasix 20 mg PO and f/u w/cardiologist d/t persistent HTN. Pt states she has an appt set up with cardiology. Pt was disgruntled about receiving Lasix d/t other "fluid pills" she is taking, but she was compliant in taking Lasix.

## 2020-01-11 ENCOUNTER — Other Ambulatory Visit: Payer: Self-pay | Admitting: Internal Medicine

## 2020-01-13 ENCOUNTER — Other Ambulatory Visit: Payer: Self-pay | Admitting: Internal Medicine

## 2020-01-13 MED ORDER — MONTELUKAST SODIUM 10 MG PO TABS
ORAL_TABLET | ORAL | 3 refills | Status: DC
Start: 2020-01-13 — End: 2021-04-03

## 2020-01-17 ENCOUNTER — Other Ambulatory Visit: Payer: Self-pay | Admitting: Cardiovascular Disease

## 2020-01-17 ENCOUNTER — Other Ambulatory Visit: Payer: Self-pay | Admitting: Adult Health

## 2020-01-17 ENCOUNTER — Other Ambulatory Visit: Payer: Self-pay | Admitting: Internal Medicine

## 2020-01-20 ENCOUNTER — Telehealth: Payer: Self-pay | Admitting: Internal Medicine

## 2020-01-20 NOTE — Telephone Encounter (Signed)
Pt calling to schedule an appt for gerd. Last refill stated she needed to be seen. Pt scheduled to see Dr. Hilarie Fredrickson 02/10/20@9 :30am. Pt aware of appt.

## 2020-01-20 NOTE — Telephone Encounter (Signed)
Pt is requesting a call back from a nurse to discuss a follow up that she needs with Dr Hilarie Fredrickson, pt did not disclose any further information

## 2020-01-20 NOTE — Progress Notes (Signed)
Hypertension Clinic Follow up:    Date:  01/23/2020   ID:  Erica [name], DOB 1975-12-05, MRN [id number]  PCP:  [name], MD  Cardiologist:  Ena Dawley, MD  Nephrologist:  Referring MD: [name], MD   CC: Hypertension  History of Present Illness:    Erica [name] is a 44 y.o. female with a hx of resistant hypertension, chronic diastolic heart failure, breast cancer s/p chemo and XRT, morbid obesity and OSA intolerant of CPAP here to establish care in the hypertension clinic. She reports that her BP has been elevated since she was treated for breast cancer in 2010.  Since then her blood pressure has never been very well have been controlled.  She has been on multiple antihypertensives and her blood pressure remains in the 027X to 412I systolic.  She struggles with exertional dyspnea that makes it hard for her to walk even short distances.  Echo 03/2018 revealed LVEF 45 to 50% with grade 2 diastolic dysfunction.  There was mild MR.  PASP was 33.  Erica [name] has a history of recurrent syncope.  She had an episode that occurred while wearing an ambulatory monitor and the episode was felt to be due to narcolepsy.  She has sleep apnea but is not tolerant of her CPAP machine.  She now uses her BIPAP machine but struggles with insomnia.  She wakes up hourly.  She underwent left and right heart catheterization on 05/04/2019.  She was found to have 60% distal left circumflex and 80% distal LAD disease.  Given her aspirin allergy Dr. Meda Coffee recommended that she start clopidogrel.  Right atrial pressure was 10, and PA pressure was 26/10.  PCWP 18.  LVEDP was 23 mmHg.  At her first hypertension clinic visit there were concerns about whether she was taking her medication as prescribed.  Discrepancies between what was ordered and what she was actually picking up from the pharmacy.  She continues to report taking all medications as prescribed.  Her blood pressures continue to run quite  elevated.  At her initial assessment doxazosin was added to her regimen.  This does not seem to have helped much.  She followed up with our pharmacist and was started on amlodipine.  She is unable to exercise due to poorly controlled blood pressure.  She has 1 caffeinated drink daily and does not use over-the-counter supplements or medications.  She continues to try to minimize her sodium intake.  Renal artery Dopplers revealed 1- 59% stenosis bilaterally in 2019.  CT-A of the abdomen showed a 1.3 cm renal cyst but no adrenal adenomas.  There was no evidence of renal artery stenosis.  She last saw our pharmacist on 12/2019.  She was in a lot of pain at the time.  Given her high number or medications and her pain, her medications were not changed.  She has struggled with LE edema.  LE Doppler was negative for DVT 10/2019.  She was given metolazone to take prior to torsemide.  For the last two months she has been more swollen but it is finally better.  Her weight has decreased from 211 lb to 187 lb.  Erica [name] continues to struggle with shortness of breath. Her edema has resolved but there is no improvement in her breathing.  She uses her CPAP every other night.  She notes that there is a recall on her CPAP and she isn't supposed to be using it.  However, she can't sleep well without  it. Lately her BP at home has been in the 150-200s.  She notes that when she has pain it is higher.  She has a lot of joint pain, especially in her feel.  She tries to walk for exercise but is limited by pain.  She also notes exertional chest pain. She notes that hydralazine doesn't seem to really affect her blood pressure.  She would like to eliminate some of her medication.  She wonders if she should just stop everything and start all over.   Past Medical History:  Diagnosis Date  . Allergy   . Anxiety   . Apnea, sleep 05/04/2014   AHI 31/hr now on CPAP at 12cm H2O  . Arthritis   . Asthma   . Asthma without status asthmaticus  01/19/2016  . Axillary hidradenitis suppurativa 08/01/2011  . Blood transfusion without reported diagnosis   . Breast cancer ([place]) 2010   a. locally advanced left breast carcinoma diagnosed in 2010 and treated with neoadjuvant chemotherapy with docetaxel and cyclophosphamide as well as paclitaxel. This was followed by radiation therapy which was completed in 2011.  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 CHF (congestive heart failure) ([place])   . Chronic diastolic CHF (congestive heart failure) ([place]) 06/23/2013  . Chronic kidney disease   . Chronic pain in left foot 06/29/2018  . Cough variant asthma 09/27/2010   Followed in Pulmonary clinic/ [facility]/ [place]  Onset reported in childhood  - PFT's 01/31/2011 FEV1  1.76 (60% ) ratio 68 and no better p B2,  DLCO 70% -med review 09/25/2012 > [place] drug assistance  paperwork given    - HFA 90% 11/20/2012 .  -Med calendar 12/07/12 , 04/06/2013 , 04/18/2014,, 08/30/2014 , 11/08/2014  - PFT's 01/12/2013  1.73 (65%)  Ratio 73 and no better p B2, DLCO 86% - trial off   . 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 Dyspnea on exertion    a. 01/2013 Lexi MV: EF 54%, no ischemia/infarct;  b. 05/2013 Echo: EF 60-65%, no rwma, Gr 2 DD;  b.   . Essential hypertension 10/09/2014   Estimated Creatinine Clearance: 82.4 mL/min (by C-G formula based on SCr of 0.98 mg/dL).  . Fatty liver   . Gastroparesis   . GERD (gastroesophageal reflux disease)   . Hyperglycemia 04/13/2011  . Hypertension   . Hypertensive heart disease 09/26/2015  . Hypertriglyceridemia without hypercholesterolemia 06/15/2015   Framingham risk score is 1%   . Idiopathic intracranial hypertension 10/21/2014  . Impaired glucose tolerance 04/13/2011  . Insomnia secondary to anxiety 04/05/2015  . Lymphadenitis, chronic    restricted LEFT extremity  . Malignant neoplasm of upper-outer quadrant of left breast in female, estrogen receptor negative ([place]) 01/07/2013   Stage III, Invasive  Ductal, s/p partial Left Mastectomy/Lumpectomy (baseball sized) with Lymph Node dissection, had Chemo and RadRx    . 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 OSA (obstructive sleep apnea) 05/04/2014   AHI 31/hr now on CPAP at 12cm H2O  . Oxygen deficiency    Uses 02 in CPAP  . Persistent proteinuria 03/31/2018  . Personal history of chemotherapy 2010  . Personal history of radiation therapy 2010  . Sleep apnea   . Tibialis posterior tendinitis, left 07/15/2018   Injected in March 10, 2019  . Upper airway cough syndrome 10/09/2014   Onset around 2012 with assoc pseudowheeze MRI 05/18/15 > Paranasal sinuses are clear - 11/17/2015 rec increase h1 to 2 hs to help with noct cough  -  Allergy profile 11/17/2015 >  Eos 0.1 /  IgE  80 POS RAST  to grass, ragweed, cedar tree  - d/c acei 03/06/2018  - flare end of Jan 2020 in setting of ? Uri/ poorly controlled GERD - flare mid Oct 2020 with uri / assoc secondary gerd  . Vitamin D deficiency disease 03/31/2018    Past Surgical History:  Procedure Laterality Date  . BRAIN SURGERY     [place]  . BREAST LUMPECTOMY Left 09/2008  . CARPAL TUNNEL RELEASE Left 2011  . LYMPH NODE DISSECTION Left 2010   breast; 2 wks after breast lumpectomy  . RIGHT/LEFT HEART CATH AND CORONARY ANGIOGRAPHY N/A 05/04/2019   Procedure: RIGHT/LEFT HEART CATH AND CORONARY ANGIOGRAPHY;  Surgeon: Jolaine Artist, MD;  Location: [facility];  Service: Cardiovascular;  Laterality: N/A;  Renal injection preformed   . TENDON RELEASE Right 2012   "hand"  . TUBAL LIGATION  05/11/2011   Procedure: POST PARTUM TUBAL LIGATION;  Surgeon: Emeterio Reeve, MD;  Location: [facility];  Service: Gynecology;  Laterality: Bilateral;  Induced for HTN    Current Medications: Current Meds  Medication Sig  . acetaminophen-codeine (TYLENOL #4) 300-60 MG tablet Take 1 tablet by mouth every 6 (six) hours as needed for pain.  Marland Kitchen acetaZOLAMIDE (DIAMOX) 250 MG tablet Take 4 tablets (1,000 mg total) by mouth  3 (three) times daily.  Marland Kitchen albuterol (PROVENTIL) (2.5 MG/3ML) 0.083% nebulizer solution USE 1 VIAL VIA NEBULIZER EVERY 4 HOURS AND AS NEEDED FOR WHEEZING OR SHORTNESS OF BREATH  . albuterol (VENTOLIN HFA) 108 (90 Base) MCG/ACT inhaler INHALE 2 PUFFS BY MOUTH EVERY 4 HOURS AS NEEDED FOR WHEEZE OR FOR SHORTNESS OF BREATH  . amitriptyline (ELAVIL) 100 MG tablet TAKE 1 TABLET BY MOUTH EVERYDAY AT BEDTIME  . azithromycin (ZITHROMAX) 250 MG tablet Take 1 tablet (250 mg total) by mouth as directed.  . Biotin 5000 MCG TABS Take 5,000 mg by mouth 2 (two) times daily.   . Cholecalciferol 1.25 MG (50000 UT) capsule Take 1 capsule (50,000 Units total) by mouth once a week.  . cloNIDine (CATAPRES - DOSED IN MG/24 HR) 0.3 mg/24hr patch PLACE 1 PATCH ONTO SKIN ONCE WEEKLY  . clopidogrel (PLAVIX) 75 MG tablet Take 1 tablet (75 mg total) by mouth daily.  . clotrimazole (MYCELEX) 10 MG troche Take 1 tablet (10 mg total) by mouth 5 (five) times daily.  Marland Kitchen dexlansoprazole (DEXILANT) 60 MG capsule Take 1 capsule (60 mg total) by mouth daily.  Marland Kitchen dextromethorphan-guaiFENesin (MUCINEX DM) 30-600 MG 12hr tablet Take 2 tablets by mouth 2 (two) times daily.   Marland Kitchen doxazosin (CARDURA) 8 MG tablet Take 1 tablet (8 mg total) by mouth 2 (two) times daily.  . DULoxetine (CYMBALTA) 30 MG capsule Take 1 capsule (30 mg total) by mouth daily. With 60mg  for total of 90 mg  . DULoxetine (CYMBALTA) 60 MG capsule Take 1 capsule (60 mg total) by mouth daily. For total of 90 mg  . EPINEPHRINE 0.3 mg/0.3 mL IJ SOAJ injection INJECT 0.3 MLS INTO THE MUSCLE ONCE AS NEEDED FOR ANAPHYLAXIS  . ezetimibe (ZETIA) 10 MG tablet Take 1 tablet (10 mg total) by mouth daily.  . famotidine (PEPCID) 20 MG tablet Take 1-2 tablets (20-40 mg total) by mouth at bedtime. NEEDS OFFICE VISIT FOR FURTHER REFILLS  . gabapentin (NEURONTIN) 300 MG capsule Take 1 capsule (300 mg total) by mouth 3 (three) times daily. One three times daily  . lidocaine-prilocaine (EMLA)  cream Apply  1 application topically every 28 (twenty-eight) days. Apply 1 application to skin ever 28 days  . metoCLOPramide (REGLAN) 10 MG tablet Take 1 tablet (10 mg total) by mouth 4 (four) times daily.  . minoxidil (LONITEN) 2.5 MG tablet Take 2 tablets (5 mg total) by mouth 2 (two) times daily.  . montelukast (SINGULAIR) 10 MG tablet TAKE 1 TABLET BY MOUTH EVERYDAY AT BEDTIME  . Nebivolol HCl (BYSTOLIC) 20 MG TABS Take 2 tablets (40 mg total) by mouth daily.  Marland Kitchen NITROSTAT 0.4 MG SL tablet PLACE 1 TABLET UNDER TONGUE EVERY 5 MINUTES AS NEEDED FOR CHEST PAIN, MAX OF 3 TABLETS THEN CALL 911 IF PAIN PERSISTS  . Olmesartan-amLODIPine-HCTZ 40-10-25 MG TABS Take 1 tablet by mouth daily.  Marland Kitchen oxymetazoline (MUCINEX SINUS-MAX FULL FORCE) 0.05 % nasal spray Place 1 spray into both nostrils 2 (two) times daily.   . Potassium Chloride ER 20 MEQ TBCR TAKE 1 TABLET BY MOUTH TWICE A DAY  . predniSONE (DELTASONE) 10 MG tablet 4 x 2 days, 3 x 2 days, 2 x 2 days, 1 x 2 days, then stop  . primidone (MYSOLINE) 50 MG tablet Take 0.5 tablets (25 mg total) by mouth at bedtime.  . promethazine (PHENERGAN) 25 MG suppository Place 25 mg rectally every 6 (six) hours as needed for nausea or vomiting.   . promethazine (PHENERGAN) 25 MG tablet TAKE 1 TABLET (25 MG TOTAL) BY MOUTH 3 (THREE) TIMES DAILY AS NEEDED FOR NAUSEA OR VOMITING.  . promethazine-dextromethorphan (PROMETHAZINE-DM) 6.25-15 MG/5ML syrup Take 5 mLs by mouth 4 (four) times daily as needed for cough.  . ranolazine (RANEXA) 1000 MG SR tablet Take 1 tablet (1,000 mg total) by mouth 2 (two) times daily.  Marland Kitchen Respiratory Therapy Supplies (FLUTTER) DEVI Use as directed  . rosuvastatin (CRESTOR) 40 MG tablet Take 1 tablet (40 mg total) by mouth daily.  Marland Kitchen Spacer/Aero-Holding Chambers (AEROCHAMBER MV) inhaler Use as instructed  . spironolactone (ALDACTONE) 100 MG tablet TAKE 1 TABLET BY MOUTH EVERY DAY  . SYMBICORT 160-4.5 MCG/ACT inhaler INHALE 2 PUFFS INTO LUNGS 2  TIMES DAILY AS DIRECTED  . torsemide (DEMADEX) 20 MG tablet Take 1 tablet (20 mg total) by mouth 2 (two) times daily.  . [DISCONTINUED] doxazosin (CARDURA) 8 MG tablet Take 1 tablet (8 mg total) by mouth daily.  . [DISCONTINUED] hydrALAZINE (APRESOLINE) 100 MG tablet Take 100 mg by mouth 2 (two) times daily. TAKE 1 TABLET 3 TIMES A DAY  . [DISCONTINUED] Nebivolol HCl (BYSTOLIC) 20 MG TABS Take 1 tablet (20 mg total) by mouth daily.  . [DISCONTINUED] ranolazine (RANEXA) 500 MG 12 hr tablet TAKE 1 TABLET BY MOUTH TWICE A DAY     Allergies:   Aspirin, Doxycycline, Kiwi extract, Metoprolol, Penicillins, Strawberry extract, and Sulfamethoxazole-trimethoprim   Social History   Socioeconomic History  . Marital status: Single    Spouse name: Not on file  . Number of children: 3  . Years of education: Not on file  . Highest education level: Not on file  Occupational History    Employer: UNEMPLOYED  Tobacco Use  . Smoking status: Never Smoker  . Smokeless tobacco: Never Used  Vaping Use  . Vaping Use: Never used  Substance and Sexual Activity  . Alcohol use: Yes    Alcohol/week: 0.0 standard drinks    Comment: occasionally/socially  . Drug use: No  . Sexual activity: Yes    Birth control/protection: Surgical  Other Topics Concern  . Not on file  Social History Narrative  She lives with four children.   She is currently not working (disbaility pending).   Left handed   One story home   Drinks caffeine      Social Determinants of Health   Financial Resource Strain:   . Difficulty of Paying Living Expenses: Not on file  Food Insecurity: Food Insecurity Present  . Worried About Charity fundraiser in the Last Year: Often true  . Ran Out of Food in the Last Year: Often true  Transportation Needs:   . Lack of Transportation (Medical): Not on file  . Lack of Transportation (Non-Medical): Not on file  Physical Activity:   . Days of Exercise per Week: Not on file  . Minutes of  Exercise per Session: Not on file  Stress:   . Feeling of Stress : Not on file  Social Connections:   . Frequency of Communication with Friends and Family: Not on file  . Frequency of Social Gatherings with Friends and Family: Not on file  . Attends Religious Services: Not on file  . Active Member of Clubs or Organizations: Not on file  . Attends Archivist Meetings: Not on file  . Marital Status: Not on file     Family History: The patient's family history includes Breast cancer in some other family members; CAD in her maternal grandmother; Diabetes in her maternal grandmother; Fibroids (age of onset: 12) in her sister; Heart disease in her maternal grandmother; Hypertension in her maternal grandmother; Other (age of onset: 72) in her mother. There is no history of Cancer, Alcohol abuse, Early death, Hyperlipidemia, Kidney disease, Stroke, Colon cancer, Esophageal cancer, Stomach cancer, or Rectal cancer.  ROS:   Please see the history of present illness.     All other systems reviewed and are negative.  EKGs/Labs/Other Studies Reviewed:    EKG:  EKG is not ordered today.  The ekg ordered 04/23/19 demonstrates sinus tachycardia.  Rate 124 bpm.  LVH  Recent Labs: 04/30/2019: NT-Pro BNP 346; TSH 1.470 12/09/2019: ALT 22; BUN 13; Creatinine, Ser 1.45; Hemoglobin 11.6; Platelets 281; Potassium 3.3; Sodium 139   Recent Lipid Panel    Component Value Date/Time   CHOL 176 06/04/2019 0825   TRIG 86 06/04/2019 0825   HDL 42 06/04/2019 0825   CHOLHDL 4.2 06/04/2019 0825   CHOLHDL 4 10/01/2018 1115   VLDL 22.6 10/01/2018 1115   LDLCALC 118 (H) 06/04/2019 0825   LDLDIRECT 152.0 03/30/2018 1604    Echo 03/12/18: Study Conclusions  - Left ventricle: The cavity size was normal. Wall thickness was  increased in a pattern of moderate LVH. Systolic function was  mildly reduced. The estimated ejection fraction was in the range  of 45% to 50%. Wall motion was normal; there were  no regional  wall motion abnormalities. Features are consistent with a  pseudonormal left ventricular filling pattern, with concomitant  abnormal relaxation and increased filling pressure (grade 2  diastolic dysfunction).  - Mitral valve: There was mild regurgitation.  - Pulmonary arteries: Systolic pressure was mildly increased. PA  peak pressure: 33 mm Hg (S).   Lexiscan Myoview 04/20/18:   The left ventricular ejection fraction is moderately decreased (30-44%).  Nuclear stress EF: 41%.  There was no ST segment deviation noted during stress.  This is an intermediate risk study.   Abnormal, intermediate risk stress nuclear study with no ischemia or infarction; EF 41 with global hypokinesis and mild LVE; study intermediate risk due to reduced LV function.  Renal artery  Doppler 10/29/18: Right: 1-59% stenosis of the right renal artery. Cyst(s) noted.  Left: 1-59% stenosis of the left renal artery.    LHC/RHC 05/04/19:  Dist LAD-1 lesion is 30% stenosed.  Dist LAD-2 lesion is 80% stenosed.  Dist Cx lesion is 60% stenosed.   Findings:  Ao = 173/104 (134) LV =  181/25 RA =  10 RV =  41/13 PA =  42/10 (26) PCW = 18 Fick cardiac output/index = 5.8/2.9 PVR = 1.4 wu Ao sat = 98% PA sat = 73%, 73%  Assessment: 1. 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 LVEF 55-60% 3. Mildly elevated filling pressures with mild pulmonary venous HTN m4. Widely patent bilateral renal arteries. No evidence of AAA 5. Severe systemic HTN    Physical Exam:    Wt Readings from Last 3 Encounters:  01/21/20 187 lb (84.8 kg)  12/09/19 202 lb 3.2 oz (91.7 kg)  12/08/19 199 lb 3.2 oz (90.4 kg)    VS:  BP (!) 186/112   Pulse 98   Ht 6' (1.829 m)   Wt 187 lb (84.8 kg)   SpO2 99%   BMI 25.36 kg/m  , BMI Body mass index is 25.36 kg/m. GENERAL:  Well appearing HEENT: Pupils equal round and reactive, fundi not visualized, oral mucosa unremarkable NECK:  No jugular venous  distention, waveform within normal limits, carotid upstroke brisk and symmetric, no bruits LUNGS:  Clear to auscultation bilaterally HEART:  RRR.  PMI not displaced or sustained,S1 and S2 within normal limits, no S3, no S4, no clicks, no rubs, no murmurs ABD:  Flat, positive bowel sounds normal in frequency in pitch, no bruits, no rebound, no guarding, no midline pulsatile mass, no hepatomegaly, no splenomegaly EXT:  2 plus pulses throughout, no edema, no cyanosis no clubbing SKIN:  No rashes no nodules NEURO:  Cranial nerves II through XII grossly intact, motor grossly intact throughout PSYCH:  Cognitively intact, oriented to person place and time   ASSESSMENT:    1. Essential hypertension   2. Chronic diastolic CHF (congestive heart failure) ([place])   3. OSA (obstructive sleep apnea)     PLAN:    # Resistant hypertension:  Ms. Ficco BP remains quite elevated.  She really wants to stop some of her medications.  We discussed the fact that this isn't advisable given that her BP is still dangerously elevated.  TID medications can be difficult.  We will work on weaning off hydralazine.  Increase dosxazosin to 8mg  bid and nebivolol to 40mg .  Reduce hydralazine to 50mg  tid.  Clonidine patch, Tribenzor (olmesartan, amlodipine, HCTZ), and spironolactone.  She has no renal artery stenosis or adrenal adenomas.  She would be a good candidate for renal denervation. Once her blood pressure is better controlled we will try to get her enrolled in the PREP program to start exercise.    # OSA:  Continue BiPAP.  Will contact our team to see if we can help with her recall.  # CAD:  She reports exertional chest pain and has 80% distal LAD disease.  She doesn't tolerate Imdur 2/2 headaches.  Increase Ranexa to 1000 mg bid.  Continue clopidogrel, ranolazine, Zetia and increase nebivolol as above.  Disposition:    F/u in 1 month.  Time spent: 35 minutes-Greater than 50% of this time was spent in counseling,  explanation of diagnosis, planning of further management, and coordination of care.    Medication Adjustments/Labs and Tests Ordered: Current medicines are reviewed at length with  the patient today.  Concerns regarding medicines are outlined above.  No orders of the defined types were placed in this encounter.  Meds ordered this encounter  Medications  . ranolazine (RANEXA) 1000 MG SR tablet    Sig: Take 1 tablet (1,000 mg total) by mouth 2 (two) times daily.    Dispense:  180 tablet    Refill:  3    NEW DOSE, D/C PREVIOUS RX  . doxazosin (CARDURA) 8 MG tablet    Sig: Take 1 tablet (8 mg total) by mouth 2 (two) times daily.    Dispense:  180 tablet    Refill:  3    NEW DOSE, D/C PREVIOUS RX  . hydrALAZINE (APRESOLINE) 50 MG tablet    Sig: Take 1 tablet (50 mg total) by mouth 3 (three) times daily.    Dispense:  270 tablet    Refill:  3    NEW DOSE, D/C PREVIOUS RX  . Nebivolol HCl (BYSTOLIC) 20 MG TABS    Sig: Take 2 tablets (40 mg total) by mouth daily.    Dispense:  180 tablet    Refill:  3    NEW DOSE, D/C PREVIOUS RX     Signed, Skeet Latch, MD  01/23/2020 1:46 PM    [facility]

## 2020-01-21 ENCOUNTER — Other Ambulatory Visit: Payer: Self-pay

## 2020-01-21 ENCOUNTER — Encounter: Payer: Self-pay | Admitting: Cardiovascular Disease

## 2020-01-21 ENCOUNTER — Ambulatory Visit (INDEPENDENT_AMBULATORY_CARE_PROVIDER_SITE_OTHER): Payer: Medicare Other | Admitting: Cardiovascular Disease

## 2020-01-21 VITALS — BP 186/112 | HR 98 | Ht 72.0 in | Wt 187.0 lb

## 2020-01-21 DIAGNOSIS — I5032 Chronic diastolic (congestive) heart failure: Secondary | ICD-10-CM

## 2020-01-21 DIAGNOSIS — G4733 Obstructive sleep apnea (adult) (pediatric): Secondary | ICD-10-CM | POA: Diagnosis not present

## 2020-01-21 DIAGNOSIS — I10 Essential (primary) hypertension: Secondary | ICD-10-CM

## 2020-01-21 MED ORDER — HYDRALAZINE HCL 50 MG PO TABS: 50.0000 mg | ORAL_TABLET | Freq: Three times a day (TID) | ORAL | 3 refills | Status: DC

## 2020-01-21 MED ORDER — BYSTOLIC 20 MG PO TABS
2.0000 | ORAL_TABLET | Freq: Every day | ORAL | 3 refills | Status: DC
Start: 2020-01-21 — End: 2020-04-10

## 2020-01-21 MED ORDER — DOXAZOSIN MESYLATE 8 MG PO TABS
8.0000 mg | ORAL_TABLET | Freq: Two times a day (BID) | ORAL | 3 refills | Status: DC
Start: 2020-01-21 — End: 2021-05-18

## 2020-01-21 MED ORDER — RANOLAZINE ER 1000 MG PO TB12
1000.0000 mg | ORAL_TABLET | Freq: Two times a day (BID) | ORAL | 3 refills | Status: DC
Start: 2020-01-21 — End: 2021-01-22

## 2020-01-21 NOTE — Patient Instructions (Addendum)
Medication Instructions:  INCREASE YOUR RANEXA TO 1000 MG TWICE A DAY  INCREASE YOUR DOXAZOSIN TO 8 MG TWICE A DAY   DECREASE YOUR HYDRALAZINE TO 50 MG THREE TIMES A DAY   INCREASE YOUR BYSTOLIC TO 40 MG DAILY   Labwork: NONE  Testing/Procedures: NONE  Follow-Up: 02/28/2020 AT 8:00 AM   Any Other Special Instructions Will Be Listed Below (If Applicable).  WILL  HAVE PREP TEAM CALL YOU TO GET ENROLLED IN PROGRAM   If you need a refill on your cardiac medications before your next appointment, please call your pharmacy.

## 2020-01-23 ENCOUNTER — Encounter: Payer: Self-pay | Admitting: Cardiovascular Disease

## 2020-01-28 ENCOUNTER — Telehealth: Payer: Self-pay

## 2020-01-28 NOTE — Telephone Encounter (Signed)
Call to pt reference PREP referral Explained program is interested in starting. Offered afternoon class.  Would be best to do an am class.  Advised will call back when am class available.

## 2020-02-03 ENCOUNTER — Other Ambulatory Visit: Payer: Self-pay

## 2020-02-03 ENCOUNTER — Inpatient Hospital Stay: Payer: Medicare Other | Attending: Oncology

## 2020-02-03 ENCOUNTER — Emergency Department (HOSPITAL_COMMUNITY)
Admission: EM | Admit: 2020-02-03 | Discharge: 2020-02-03 | Disposition: A | Discharge status: Home / Self Care | Payer: Medicare Other | Attending: Emergency Medicine | Admitting: Emergency Medicine

## 2020-02-03 ENCOUNTER — Encounter (HOSPITAL_COMMUNITY): Payer: Self-pay | Admitting: Emergency Medicine

## 2020-02-03 VITALS — BP 210/112 | HR 103 | Temp 99.1°F | Resp 18

## 2020-02-03 DIAGNOSIS — I5032 Chronic diastolic (congestive) heart failure: Secondary | ICD-10-CM | POA: Insufficient documentation

## 2020-02-03 DIAGNOSIS — I130 Hypertensive heart and chronic kidney disease with heart failure and stage 1 through stage 4 chronic kidney disease, or unspecified chronic kidney disease: Secondary | ICD-10-CM | POA: Insufficient documentation

## 2020-02-03 DIAGNOSIS — J45909 Unspecified asthma, uncomplicated: Secondary | ICD-10-CM | POA: Insufficient documentation

## 2020-02-03 DIAGNOSIS — I10 Essential (primary) hypertension: Secondary | ICD-10-CM

## 2020-02-03 DIAGNOSIS — N189 Chronic kidney disease, unspecified: Secondary | ICD-10-CM | POA: Diagnosis not present

## 2020-02-03 DIAGNOSIS — Z9012 Acquired absence of left breast and nipple: Secondary | ICD-10-CM | POA: Insufficient documentation

## 2020-02-03 DIAGNOSIS — Z853 Personal history of malignant neoplasm of breast: Secondary | ICD-10-CM | POA: Diagnosis not present

## 2020-02-03 DIAGNOSIS — Z79899 Other long term (current) drug therapy: Secondary | ICD-10-CM | POA: Insufficient documentation

## 2020-02-03 DIAGNOSIS — C50412 Malignant neoplasm of upper-outer quadrant of left female breast: Secondary | ICD-10-CM

## 2020-02-03 MED ORDER — GOSERELIN ACETATE 3.6 MG ~~LOC~~ IMPL
DRUG_IMPLANT | SUBCUTANEOUS | Status: AC
  Filled 2020-02-03: qty 3.6

## 2020-02-03 MED ORDER — GOSERELIN ACETATE 3.6 MG ~~LOC~~ IMPL
3.6000 mg | DRUG_IMPLANT | SUBCUTANEOUS | Status: DC
  Administered 2020-02-03: 3.6 mg via SUBCUTANEOUS

## 2020-02-03 NOTE — Progress Notes (Signed)
Flush nurse notified RN of patient's BP of 208/110.  Pt denies taking her BP medication today.    MD notified - Ok to proceed with Faslodex injection today, however recommendations to proceed to ER for evaluation.  RN notified flush nurse.

## 2020-02-03 NOTE — ED Triage Notes (Signed)
Sent from the cancer center, pt had an injection this morning and her BP was high. She has not taken any BP maintenance medications and took 2 puffs of her albuterol inhaler.

## 2020-02-03 NOTE — Progress Notes (Signed)
Dr [name] notified of patient high blood pressure. Patient did not take blood pressure medication this a.m.  BP taken manually and no change was noted.  OK to give zoladex injection today Per Dr [name], patient is advised to go to ED

## 2020-02-03 NOTE — Discharge Instructions (Signed)
Please return for any problem.  °

## 2020-02-03 NOTE — ED Provider Notes (Signed)
[facility] DEPT Provider Note   CSN: [id number] Arrival date & time: 02/03/20  4081     History No chief complaint on file.   Erica [name] is a 44 y.o. female.  44 year old female with prior medical history as detailed below presents for evaluation.  Patient was receiving a Zoladex injection this morning.  She admits that she left the house a little late in order to make it to her appointment.  She did not take her antihypertensive medications this morning prior to this evaluation.  During her evaluation prior to Zoladex injection her blood pressure was noted to be elevated.  She was advised to come to the ED for evaluation.  She denies complaints such as chest pain, headache, vision change, nausea, vomiting, shortness of breath, or other specific complaint.  She admits that she did not yet take her morning antihypertensive medications.  She reports a normal blood pressure -- even with medication --  in the 448'J to 856D systolic.  She does not appear to have acute symptoms related to her elevated blood pressure.  Patient's blood pressure today is mildly elevated above her baseline per report.  The history is provided by the patient and medical records.  Hypertension This is a chronic problem. The current episode started more than 1 week ago. The problem occurs rarely. The problem has not changed since onset.Pertinent negatives include no chest pain, no abdominal pain, no headaches and no shortness of breath. Nothing aggravates the symptoms. Nothing relieves the symptoms.       Past Medical History:  Diagnosis Date  . Allergy   . Anxiety   . Apnea, sleep 05/04/2014   AHI 31/hr now on CPAP at 12cm H2O  . Arthritis   . Asthma   . Asthma without status asthmaticus 01/19/2016  . Axillary hidradenitis suppurativa 08/01/2011  . Blood transfusion without reported diagnosis   . Breast cancer ([place]) 2010   a. locally advanced left breast carcinoma diagnosed in  2010 and treated with neoadjuvant chemotherapy with docetaxel and cyclophosphamide as well as paclitaxel. This was followed by radiation therapy which was completed in 2011.  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 CHF (congestive heart failure) ([place])   . Chronic diastolic CHF (congestive heart failure) ([place]) 06/23/2013  . Chronic kidney disease   . Chronic pain in left foot 06/29/2018  . Cough variant asthma 09/27/2010   Followed in Pulmonary clinic/ [facility]/ [facility]  Onset reported in childhood  - PFT's 01/31/2011 FEV1  1.76 (60% ) ratio 68 and no better p B2,  DLCO 70% -med review 09/25/2012 > [place] drug assistance  paperwork given    - HFA 90% 11/20/2012 .  -Med calendar 12/07/12 , 04/06/2013 , 04/18/2014,, 08/30/2014 , 11/08/2014  - PFT's 01/12/2013  1.73 (65%)  Ratio 73 and no better p B2, DLCO 86% - trial off   . 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 Dyspnea on exertion    a. 01/2013 Lexi MV: EF 54%, no ischemia/infarct;  b. 05/2013 Echo: EF 60-65%, no rwma, Gr 2 DD;  b.   . Essential hypertension 10/09/2014   Estimated Creatinine Clearance: 82.4 mL/min (by C-G formula based on SCr of 0.98 mg/dL).  . Fatty liver   . Gastroparesis   . GERD (gastroesophageal reflux disease)   . Hyperglycemia 04/13/2011  . Hypertension   . Hypertensive heart disease 09/26/2015  . Hypertriglyceridemia without hypercholesterolemia 06/15/2015   Framingham risk score is 1%   . Idiopathic intracranial  hypertension 10/21/2014  . Impaired glucose tolerance 04/13/2011  . Insomnia secondary to anxiety 04/05/2015  . Lymphadenitis, chronic    restricted LEFT extremity  . Malignant neoplasm of upper-outer quadrant of left breast in female, estrogen receptor negative ([place]) 01/07/2013   Stage III, Invasive Ductal, s/p partial Left Mastectomy/Lumpectomy (baseball sized) with Lymph Node dissection, had Chemo and RadRx    . 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 OSA (obstructive sleep apnea) 05/04/2014     AHI 31/hr now on CPAP at 12cm H2O  . Oxygen deficiency    Uses 02 in CPAP  . Persistent proteinuria 03/31/2018  . Personal history of chemotherapy 2010  . Personal history of radiation therapy 2010  . Sleep apnea   . Tibialis posterior tendinitis, left 07/15/2018   Injected in March 10, 2019  . Upper airway cough syndrome 10/09/2014   Onset around 2012 with assoc pseudowheeze MRI 05/18/15 > Paranasal sinuses are clear - 11/17/2015 rec increase h1 to 2 hs to help with noct cough  - Allergy profile 11/17/2015 >  Eos 0.1 /  IgE  80 POS RAST  to grass, ragweed, cedar tree  - d/c acei 03/06/2018  - flare end of Jan 2020 in setting of ? Uri/ poorly controlled GERD - flare mid Oct 2020 with uri / assoc secondary gerd  . Vitamin D deficiency disease 03/31/2018    Patient Active Problem List   Diagnosis Date Noted  . Right ankle pain 04/14/2019  . OSA (obstructive sleep apnea) 10/01/2018  . Tibialis posterior tendinitis, left 07/15/2018  . Chronic pain in left foot 06/29/2018  . Vitamin D deficiency disease 03/31/2018  . Persistent proteinuria 03/31/2018  . Hyperlipidemia LDL goal <100 03/30/2018  . 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 Asthma without status asthmaticus 01/19/2016  . Hypertensive heart disease 09/26/2015  . 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 Hypertriglyceridemia without hypercholesterolemia 06/15/2015  . Hypokalemia 06/15/2015  . Insomnia secondary to anxiety 04/05/2015  . Idiopathic intracranial hypertension 10/21/2014  . Upper airway cough syndrome 10/09/2014  . Essential hypertension 10/09/2014  . Mild obesity 10/09/2014  . Apnea, sleep 05/04/2014  . DOE (dyspnea on exertion) 07/14/2013  . Chronic diastolic CHF (congestive heart failure) ([place]) 06/23/2013  . Malignant neoplasm of upper-outer quadrant of left breast in female, estrogen receptor negative ([place]) 01/07/2013  . Gastroparesis 01/04/2013  . Axillary hidradenitis suppurativa  08/01/2011  . Preventative health care 04/13/2011  . Hyperglycemia 04/13/2011  . GERD (gastroesophageal reflux disease) 03/18/2011  . Cough variant asthma 09/27/2010    Past Surgical History:  Procedure Laterality Date  . BRAIN SURGERY     Shunt  . BREAST LUMPECTOMY Left 09/2008  . CARPAL TUNNEL RELEASE Left 2011  . LYMPH NODE DISSECTION Left 2010   breast; 2 wks after breast lumpectomy  . RIGHT/LEFT HEART CATH AND CORONARY ANGIOGRAPHY N/A 05/04/2019   Procedure: RIGHT/LEFT HEART CATH AND CORONARY ANGIOGRAPHY;  Surgeon: Jolaine Artist, MD;  Location: [facility];  Service: Cardiovascular;  Laterality: N/A;  Renal injection preformed   . TENDON RELEASE Right 2012   "hand"  . TUBAL LIGATION  05/11/2011   Procedure: POST PARTUM TUBAL LIGATION;  Surgeon: Emeterio Reeve, MD;  Location: [facility];  Service: Gynecology;  Laterality: Bilateral;  Induced for HTN     OB History    Gravida  5   Para  4   Term  4   Preterm      AB  1   Living  4     SAB  1   TAB      Ectopic      Multiple      Live Births  4           Family History  Problem Relation Age of Onset  . Other Mother 43       breast calcifications treated with surgery, breast cancer pill, and radiation  . Fibroids Sister 35       s/p TAH-BSO  . Diabetes Maternal Grandmother   . Heart disease Maternal Grandmother   . Hypertension Maternal Grandmother   . CAD Maternal Grandmother   . Breast cancer Other        maternal great aunt (MGM's sister)  . Breast cancer Other        paternal great aunt dx middle ages; s/p mastectomy  . Cancer Neg Hx   . Alcohol abuse Neg Hx   . Early death Neg Hx   . Hyperlipidemia Neg Hx   . Kidney disease Neg Hx   . Stroke Neg Hx   . Colon cancer Neg Hx   . Esophageal cancer Neg Hx   . Stomach cancer Neg Hx   . Rectal cancer Neg Hx     Social History   Tobacco Use  . Smoking status: Never Smoker  . Smokeless tobacco: Never Used  Vaping Use  . Vaping Use:  Never used  Substance Use Topics  . Alcohol use: Yes    Alcohol/week: 0.0 standard drinks    Comment: occasionally/socially  . Drug use: No    Home Medications Prior to Admission medications   Medication Sig Start Date End Date Taking? Authorizing Provider  acetaminophen-codeine (TYLENOL #4) 300-60 MG tablet Take 1 tablet by mouth every 6 (six) hours as needed for pain. 07/07/19   Parrett, Fonnie Mu, NP  acetaZOLAMIDE (DIAMOX) 250 MG tablet Take 4 tablets (1,000 mg total) by mouth 3 (three) times daily. 07/19/19   Patel, Donika K, DO  albuterol (PROVENTIL) (2.5 MG/3ML) 0.083% nebulizer solution USE 1 VIAL VIA NEBULIZER EVERY 4 HOURS AND AS NEEDED FOR WHEEZING OR SHORTNESS OF BREATH 12/17/16   Parrett, Tammy S, NP  albuterol (VENTOLIN HFA) 108 (90 Base) MCG/ACT inhaler INHALE 2 PUFFS BY MOUTH EVERY 4 HOURS AS NEEDED FOR WHEEZE OR FOR SHORTNESS OF BREATH 08/14/18   Tanda Rockers, MD  amitriptyline (ELAVIL) 100 MG tablet TAKE 1 TABLET BY MOUTH EVERYDAY AT BEDTIME 12/28/18   [name], MD  azithromycin (ZITHROMAX) 250 MG tablet Take 1 tablet (250 mg total) by mouth as directed. 12/14/19   Parrett, Fonnie Mu, NP  Biotin 5000 MCG TABS Take 5,000 mg by mouth 2 (two) times daily.     [provider]  Cholecalciferol 1.25 MG (50000 UT) capsule Take 1 capsule (50,000 Units total) by mouth once a week. 10/01/18   [name], MD  cloNIDine (CATAPRES - DOSED IN MG/24 HR) 0.3 mg/24hr patch PLACE 1 PATCH ONTO SKIN ONCE WEEKLY 10/11/19   [name], MD  clopidogrel (PLAVIX) 75 MG tablet Take 1 tablet (75 mg total) by mouth daily. 05/06/19   [name], MD  clotrimazole (MYCELEX) 10 MG troche Take 1 tablet (10 mg total) by mouth 5 (five) times daily. 03/06/18   Tanda Rockers, MD  dexlansoprazole (DEXILANT) 60 MG capsule Take 1 capsule (60 mg total) by mouth daily. 01/27/19   Pyrtle, Lajuan Lines, MD  dextromethorphan-guaiFENesin [facility] DM) 30-600 MG 12hr tablet Take  2 tablets by mouth 2 (two)  times daily.     [provider]  doxazosin (CARDURA) 8 MG tablet Take 1 tablet (8 mg total) by mouth 2 (two) times daily. 01/21/20   Skeet Latch, MD  DULoxetine (CYMBALTA) 30 MG capsule Take 1 capsule (30 mg total) by mouth daily. With 60mg  for total of 90 mg 07/19/19   Narda Amber K, DO  DULoxetine (CYMBALTA) 60 MG capsule Take 1 capsule (60 mg total) by mouth daily. For total of 90 mg 07/19/19   Narda Amber K, DO  EPINEPHRINE 0.3 mg/0.3 mL IJ SOAJ injection INJECT 0.3 MLS INTO THE MUSCLE ONCE AS NEEDED FOR ANAPHYLAXIS 12/10/16   [name], MD  ezetimibe (ZETIA) 10 MG tablet Take 1 tablet (10 mg total) by mouth daily. 11/30/19   [name], MD  famotidine (PEPCID) 20 MG tablet Take 1-2 tablets (20-40 mg total) by mouth at bedtime. NEEDS OFFICE VISIT FOR FURTHER REFILLS 11/29/19   Pyrtle, Lajuan Lines, MD  gabapentin (NEURONTIN) 300 MG capsule Take 1 capsule (300 mg total) by mouth 3 (three) times daily. One three times daily 11/23/19   Gregor Hams, MD  hydrALAZINE (APRESOLINE) 50 MG tablet Take 1 tablet (50 mg total) by mouth 3 (three) times daily. 01/21/20 04/20/20  Skeet Latch, MD  lidocaine-prilocaine (EMLA) cream Apply 1 application topically every 28 (twenty-eight) days. Apply 1 application to skin ever 28 days 02/11/17   [provider]  metoCLOPramide (REGLAN) 10 MG tablet Take 1 tablet (10 mg total) by mouth 4 (four) times daily. 01/27/19   Pyrtle, Lajuan Lines, MD  minoxidil (LONITEN) 2.5 MG tablet Take 2 tablets (5 mg total) by mouth 2 (two) times daily. 11/03/19   Imogene Burn, PA-C  montelukast (SINGULAIR) 10 MG tablet TAKE 1 TABLET BY MOUTH EVERYDAY AT BEDTIME 01/13/20   Tanda Rockers, MD  Nebivolol HCl (BYSTOLIC) 20 MG TABS Take 2 tablets (40 mg total) by mouth daily. 01/21/20   Skeet Latch, MD  NITROSTAT 0.4 MG SL tablet PLACE 1 TABLET UNDER TONGUE EVERY 5 MINUTES AS NEEDED FOR CHEST PAIN, MAX OF 3 TABLETS THEN CALL 911 IF PAIN PERSISTS 10/26/15    [name], MD  Olmesartan-amLODIPine-HCTZ 40-10-25 MG TABS Take 1 tablet by mouth daily. 09/06/19   Skeet Latch, MD  oxymetazoline [facility] SINUS-MAX FULL FORCE) 0.05 % nasal spray Place 1 spray into both nostrils 2 (two) times daily.     [provider]  Potassium Chloride ER 20 MEQ TBCR TAKE 1 TABLET BY MOUTH TWICE A DAY 07/08/18   [name], MD  predniSONE (DELTASONE) 10 MG tablet 4 x 2 days, 3 x 2 days, 2 x 2 days, 1 x 2 days, then stop 12/14/19   Parrett, Fonnie Mu, NP  primidone (MYSOLINE) 50 MG tablet Take 0.5 tablets (25 mg total) by mouth at bedtime. 07/19/19   Narda Amber K, DO  promethazine (PHENERGAN) 25 MG suppository Place 25 mg rectally every 6 (six) hours as needed for nausea or vomiting.     [provider]  promethazine (PHENERGAN) 25 MG tablet TAKE 1 TABLET (25 MG TOTAL) BY MOUTH 3 (THREE) TIMES DAILY AS NEEDED FOR NAUSEA OR VOMITING. 10/07/19   Pyrtle, Lajuan Lines, MD  promethazine-dextromethorphan (PROMETHAZINE-DM) 6.25-15 MG/5ML syrup Take 5 mLs by mouth 4 (four) times daily as needed for cough. 11/03/19   Tanda Rockers, MD  ranolazine (RANEXA) 1000 MG SR tablet Take 1 tablet (1,000 mg total) by  mouth 2 (two) times daily. 01/21/20   Skeet Latch, MD  Respiratory Therapy Supplies (FLUTTER) DEVI Use as directed 03/24/15   Tanda Rockers, MD  rosuvastatin (CRESTOR) 40 MG tablet Take 1 tablet (40 mg total) by mouth daily. 11/30/19   [name], MD  Spacer/Aero-Holding Chambers (AEROCHAMBER MV) inhaler Use as instructed 03/06/18   Tanda Rockers, MD  spironolactone (ALDACTONE) 100 MG tablet TAKE 1 TABLET BY MOUTH EVERY DAY 07/07/18   [name], MD  SYMBICORT 160-4.5 MCG/ACT inhaler INHALE 2 PUFFS INTO LUNGS 2 TIMES DAILY AS DIRECTED 05/28/19   Tanda Rockers, MD  torsemide (DEMADEX) 20 MG tablet Take 1 tablet (20 mg total) by mouth 2 (two) times daily. 12/08/19   Gregor Hams, MD    Allergies    Aspirin, Doxycycline, Kiwi extract,  Metoprolol, Penicillins, Strawberry extract, and Sulfamethoxazole-trimethoprim  Review of Systems   Review of Systems  Respiratory: Negative for shortness of breath.   Cardiovascular: Negative for chest pain.  Gastrointestinal: Negative for abdominal pain.  Neurological: Negative for headaches.  All other systems reviewed and are negative.   Physical Exam Updated Vital Signs BP (!) 234/129 (BP Location: Right Arm)   Pulse (!) 114   Temp 98.9 F (37.2 C) (Oral)   Resp 18   Ht 5\' 9"  (1.753 m)   SpO2 100%   BMI 27.62 kg/m   Physical Exam Vitals and nursing note reviewed.  Constitutional:      General: She is not in acute distress.    Appearance: Normal appearance. She is well-developed.  HENT:     Head: Normocephalic and atraumatic.  Eyes:     Conjunctiva/sclera: Conjunctivae normal.     Pupils: Pupils are equal, round, and reactive to light.  Cardiovascular:     Rate and Rhythm: Normal rate and regular rhythm.     Pulses: Normal pulses.     Heart sounds: Normal heart sounds.  Pulmonary:     Effort: Pulmonary effort is normal. No respiratory distress.     Breath sounds: Normal breath sounds.  Abdominal:     General: There is no distension.     Palpations: Abdomen is soft.     Tenderness: There is no abdominal tenderness.  Musculoskeletal:        General: No deformity. Normal range of motion.     Cervical back: Normal range of motion and neck supple.  Skin:    General: Skin is warm and dry.  Neurological:     Mental Status: She is alert and oriented to person, place, and time.     ED Results / Procedures / Treatments   Labs (all labs ordered are listed, but only abnormal results are displayed) Labs Reviewed - No data to display  EKG None  Radiology No results found.  Procedures Procedures (including critical care time)  Medications Ordered in ED Medications - No data to display  ED Course  I have reviewed the triage vital signs and the nursing  notes.  Pertinent labs & imaging results that were available during my care of the patient were reviewed by me and considered in my medical decision making (see chart for details).    MDM Rules/Calculators/A&P                          MDM  Screen complete  Erica [name] was evaluated in Emergency Department on 02/03/2020 for the symptoms described in the history of present  illness. She was evaluated in the context of the global COVID-19 pandemic, which necessitated consideration that the patient might be at risk for infection with the SARS-CoV-2 virus that causes COVID-19. Institutional protocols and algorithms that pertain to the evaluation of patients at risk for COVID-19 are in a state of rapid change based on information released by regulatory bodies including the CDC and federal and state organizations. These policies and algorithms were followed during the patient's care in the ED.  Patient is presenting for evaluation.  Patient is asymptomatic.  She has a well-established history of chronic hypertension.  She admits that she did not take her antihypertensives this morning.  After earlier evaluation prior to Zoladex injection she was advised to come to the ED for evaluation.  Patient does not appear to have evidence of end-organ damage on history or exam.  Patient desires discharge.  She reports to me that she will go directly home and take her normal antihypertensive medications.  She does have well-established follow-up.  She does understand need for continued treatment of her hypertension.  Importance of close follow-up stressed.  Strict return precautions given and understood.    Final Clinical Impression(s) / ED Diagnoses Final diagnoses:  Hypertension, unspecified type    Rx / DC Orders ED Discharge Orders    None       Valarie Merino, MD 02/03/20 [phone]

## 2020-02-10 ENCOUNTER — Encounter: Payer: Self-pay | Admitting: Internal Medicine

## 2020-02-10 ENCOUNTER — Ambulatory Visit (INDEPENDENT_AMBULATORY_CARE_PROVIDER_SITE_OTHER): Payer: Medicare Other | Admitting: Internal Medicine

## 2020-02-10 VITALS — BP 118/64 | HR 77 | Ht 66.0 in | Wt 194.8 lb

## 2020-02-10 DIAGNOSIS — K3184 Gastroparesis: Secondary | ICD-10-CM | POA: Diagnosis not present

## 2020-02-10 DIAGNOSIS — R112 Nausea with vomiting, unspecified: Secondary | ICD-10-CM

## 2020-02-10 DIAGNOSIS — K219 Gastro-esophageal reflux disease without esophagitis: Secondary | ICD-10-CM | POA: Diagnosis not present

## 2020-02-10 DIAGNOSIS — Z1211 Encounter for screening for malignant neoplasm of colon: Secondary | ICD-10-CM

## 2020-02-10 MED ORDER — PROMETHAZINE HCL 25 MG PO TABS: 25.0000 mg | ORAL_TABLET | Freq: Three times a day (TID) | ORAL | 1 refills | Status: DC | PRN

## 2020-02-10 MED ORDER — METOCLOPRAMIDE HCL 10 MG PO TABS: 10.0000 mg | ORAL_TABLET | Freq: Four times a day (QID) | ORAL | 1 refills | Status: DC

## 2020-02-10 MED ORDER — FAMOTIDINE 20 MG PO TABS
20.0000 mg | ORAL_TABLET | Freq: Every day | ORAL | 2 refills | Status: AC
Start: 2020-02-10 — End: ?

## 2020-02-10 MED ORDER — DEXILANT 60 MG PO CPDR: 60.0000 mg | DELAYED_RELEASE_CAPSULE | Freq: Every day | ORAL | 2 refills | Status: AC

## 2020-02-10 NOTE — Progress Notes (Signed)
   Subjective:    Patient ID: Erica [name], female    DOB: [birth year], 44 y.o.   MRN: [id number]  HPI Nekeshia Lenhardt is a 44 year old female well-known to me with a history of GERD, gastroparesis, severe hypertension, pseudotumor cerebri status post VP shunt, history of breast cancer treated with chemotherapy and radiation, peripheral neuropathy, OSA, obesity, heart failure on Plavix who is seen for follow-up.  She was last seen 1 year ago.  She is here alone today.  She reports for the most part she is feeling fairly well today.  She has intermittent nausea and without metoclopramide will have frequent vomiting after eating.  Her heartburn and indigestion is well controlled with Dexilant 60 mg and improved last year and we added famotidine in the evening.  Bowel movements have been mostly regular without blood or melena.  She does at times have lower abdominal crampy discomfort.  Promethazine helps with the nausea whereas Zofran never did.   Review of Systems As per HPI, otherwise negative  Current Medications, Allergies, Past Medical History, Past Surgical History, Family History and Social History were reviewed in [employer] record.     Objective:   Physical Exam BP 118/64   Pulse 77   Ht 5\' 6"  (1.676 m)   Wt 194 lb 12.8 oz (88.4 kg)   SpO2 99%   BMI 31.44 kg/m  Gen: awake, alert, NAD HEENT: anicteric CV: RRR, no mrg Pulm: CTA b/l Abd: soft, left-sided abdominal tenderness without rebound or guarding, nondistended, obese, +BS throughout Ext: no c/c/e Neuro: nonfocal      Assessment & Plan:  44 year old female well-known to me with a history of GERD, gastroparesis, severe hypertension, pseudotumor cerebri status post VP shunt, history of breast cancer treated with chemotherapy and radiation, peripheral neuropathy, OSA, obesity, heart failure on Plavix who is seen for follow-up.  1.  GERD and gastroparesis --symptoms currently well managed with current  therapy.  Without metoclopramide she has early satiety, nausea and vomiting.  She has tried to skip doses and reduce doses all leading to her turn of gastroparesis symptoms with nausea and vomiting. --Continue Dexilant 60 mg in the morning, famotidine 40 mg in the evening --Continue metoclopramide 10 mg 3 times daily before meals and at bedtime --Promethazine by mouth as needed for refractory nausea, suppository form if vomiting prevents using oral promethazine  2.  Colon cancer screening --colonoscopy recommended for average of screening in the outpatient hospital setting.  We discussed the risk, benefits and alternatives and she is agreeable and wishes to proceed.  We will need to hold Plavix 5 days before this procedure which we will discuss with her cardiologist Dr. Oval Linsey  Hold Plavix 5 days before procedure - will instruct when and how to resume after procedure. Risks and benefits of procedure including bleeding, perforation, infection, missed lesions, medication reactions and possible hospitalization or surgery if complications occur explained. Additional rare but real risk of cardiovascular event such as heart attack or ischemia/infarct of other organs off Plavix explained and need to seek urgent help if this occurs. Will communicate by phone or EMR with patient's prescribing provider that to confirm holding Plavix is reasonable in this case.   30 minutes total spent today including patient facing time, coordination of care, reviewing medical history/procedures/pertinent radiology studies, and documentation of the encounter.

## 2020-02-10 NOTE — Patient Instructions (Signed)
We have sent the following medications to your pharmacy for you to pick up at your convenience: Dexilant Famotidine Reglan Phenergan  We will contact you with a date and time for colonoscopy at the hospital once we have a schedule availability in January or February 2022.  If you are age 46 or older, your body mass index should be between 23-30. Your Body mass index is 31.44 kg/m. If this is out of the aforementioned range listed, please consider follow up with your Primary Care Provider.  If you are age 8 or younger, your body mass index should be between 19-25. Your Body mass index is 31.44 kg/m. If this is out of the aformentioned range listed, please consider follow up with your Primary Care Provider.

## 2020-02-18 ENCOUNTER — Other Ambulatory Visit: Payer: Self-pay | Admitting: Family Medicine

## 2020-02-26 ENCOUNTER — Other Ambulatory Visit: Payer: Self-pay | Admitting: Neurology

## 2020-02-28 ENCOUNTER — Encounter: Payer: Self-pay | Admitting: *Deleted

## 2020-02-28 ENCOUNTER — Other Ambulatory Visit: Payer: Self-pay

## 2020-02-28 ENCOUNTER — Ambulatory Visit (INDEPENDENT_AMBULATORY_CARE_PROVIDER_SITE_OTHER): Payer: Medicare Other | Admitting: Cardiovascular Disease

## 2020-02-28 ENCOUNTER — Encounter: Payer: Self-pay | Admitting: Cardiovascular Disease

## 2020-02-28 VITALS — BP 172/84 | HR 86 | Wt 195.0 lb

## 2020-02-28 DIAGNOSIS — I5032 Chronic diastolic (congestive) heart failure: Secondary | ICD-10-CM

## 2020-02-28 DIAGNOSIS — G4733 Obstructive sleep apnea (adult) (pediatric): Secondary | ICD-10-CM

## 2020-02-28 DIAGNOSIS — I110 Hypertensive heart disease with heart failure: Secondary | ICD-10-CM

## 2020-02-28 DIAGNOSIS — G932 Benign intracranial hypertension: Secondary | ICD-10-CM

## 2020-02-28 DIAGNOSIS — I10 Essential (primary) hypertension: Secondary | ICD-10-CM

## 2020-02-28 DIAGNOSIS — R002 Palpitations: Secondary | ICD-10-CM | POA: Insufficient documentation

## 2020-02-28 DIAGNOSIS — I5042 Chronic combined systolic (congestive) and diastolic (congestive) heart failure: Secondary | ICD-10-CM

## 2020-02-28 LAB — CBC WITH DIFFERENTIAL/PLATELET
Basophils Absolute: 0 10*3/uL (ref 0.0–0.2)
Basos: 1 %
EOS (ABSOLUTE): 0.2 10*3/uL (ref 0.0–0.4)
Eos: 3 %
Hematocrit: 40.2 % (ref 34.0–46.6)
Hemoglobin: 13.5 g/dL (ref 11.1–15.9)
Immature Grans (Abs): 0 10*3/uL (ref 0.0–0.1)
Immature Granulocytes: 0 %
Lymphocytes Absolute: 2.3 10*3/uL (ref 0.7–3.1)
Lymphs: 35 %
MCH: 28.8 pg (ref 26.6–33.0)
MCHC: 33.6 g/dL (ref 31.5–35.7)
MCV: 86 fL (ref 79–97)
Monocytes Absolute: 0.5 10*3/uL (ref 0.1–0.9)
Monocytes: 8 %
Neutrophils Absolute: 3.5 10*3/uL (ref 1.4–7.0)
Neutrophils: 53 %
Platelets: 247 10*3/uL (ref 150–450)
RBC: 4.68 x10E6/uL (ref 3.77–5.28)
RDW: 14.8 % (ref 11.7–15.4)
WBC: 6.5 10*3/uL (ref 3.4–10.8)

## 2020-02-28 LAB — LIPID PANEL
Chol/HDL Ratio: 5 ratio — ABNORMAL HIGH (ref 0.0–4.4)
Cholesterol, Total: 200 mg/dL — ABNORMAL HIGH (ref 100–199)
HDL: 40 mg/dL (ref >39)
LDL Chol Calc (NIH): 130 mg/dL — ABNORMAL HIGH (ref 0–99)
Triglycerides: 167 mg/dL — ABNORMAL HIGH (ref 0–149)
VLDL Cholesterol Cal: 30 mg/dL (ref 5–40)

## 2020-02-28 LAB — COMPREHENSIVE METABOLIC PANEL
ALT: 23 IU/L (ref 0–32)
AST: 16 IU/L (ref 0–40)
Albumin/Globulin Ratio: 1.3 (ref 1.2–2.2)
Albumin: 4.3 g/dL (ref 3.8–4.8)
Alkaline Phosphatase: 68 IU/L (ref 44–121)
BUN/Creatinine Ratio: 15 (ref 9–23)
BUN: 21 mg/dL (ref 6–24)
Bilirubin Total: 0.3 mg/dL (ref 0.0–1.2)
CO2: 26 mmol/L (ref 20–29)
Calcium: 9.8 mg/dL (ref 8.7–10.2)
Chloride: 101 mmol/L (ref 96–106)
Creatinine, Ser: 1.43 mg/dL — ABNORMAL HIGH (ref 0.57–1.00)
GFR calc Af Amer: 51 mL/min/{1.73_m2} — ABNORMAL LOW (ref >59)
GFR calc non Af Amer: 44 mL/min/{1.73_m2} — ABNORMAL LOW (ref >59)
Globulin, Total: 3.3 g/dL (ref 1.5–4.5)
Glucose: 90 mg/dL (ref 65–99)
Potassium: 3.6 mmol/L (ref 3.5–5.2)
Sodium: 141 mmol/L (ref 134–144)
Total Protein: 7.6 g/dL (ref 6.0–8.5)

## 2020-02-28 LAB — MAGNESIUM: Magnesium: 2.1 mg/dL (ref 1.6–2.3)

## 2020-02-28 MED ORDER — CLONIDINE 0.3 MG/24HR TD PTWK: MEDICATED_PATCH | TRANSDERMAL | 1 refills | Status: DC

## 2020-02-28 NOTE — Patient Instructions (Addendum)
Medication Instructions:  INCREASE YOUR CLONIDINE PATCH TO 0.6 MG (2 OF THE 0.3 MG)   Labwork: LP/CMET/CBC TODAY   Testing/Procedures: 3 DAY ZIO PATCH   Follow-Up: 04/10/2020 8:30 AM WITH DR [place]   Any Other Special Instructions Will Be Listed Below (If Applicable).  ZIO XT- Long Term Monitor Instructions   Your physician has requested you wear your ZIO patch monitor__3___days.   This is a single patch monitor.  [employer] supplies one patch monitor per enrollment.  Additional stickers are not available.   Please do not apply patch if you will be having a Nuclear Stress Test, Echocardiogram, Cardiac CT, MRI, or Chest Xray during the time frame you would be wearing the monitor. The patch cannot be worn during these tests.  You cannot remove and re-apply the ZIO XT patch monitor.   Your ZIO patch monitor will be sent USPS Priority mail from [facility] directly to your home address. The monitor may also be mailed to a PO BOX if home delivery is not available.   It may take 3-5 days to receive your monitor after you have been enrolled.   Once you have received you monitor, please review enclosed instructions.  Your monitor has already been registered assigning a specific monitor serial # to you.   Applying the monitor   Shave hair from upper left chest.   Hold abrader disc by orange tab.  Rub abrader in 40 strokes over left upper chest as indicated in your monitor instructions.   Clean area with 4 enclosed alcohol pads .  Use all pads to assure are is cleaned thoroughly.  Let dry.   Apply patch as indicated in monitor instructions.  Patch will be place under collarbone on left side of chest with arrow pointing upward.   Rub patch adhesive wings for 2 minutes.Remove white label marked "1".  Remove white label marked "2".  Rub patch adhesive wings for 2 additional minutes.   While looking in a mirror, press and release button in center of patch.  A small green light will  flash 3-4 times .  This will be your only indicator the monitor has been turned on.     Do not shower for the first 24 hours.  You may shower after the first 24 hours.   Press button if you feel a symptom. You will hear a small click.  Record Date, Time and Symptom in the Patient Log Book.   When you are ready to remove patch, follow instructions on last 2 pages of Patient Log Book.  Stick patch monitor onto last page of Patient Log Book.   Place Patient Log Book in [place] box.  Use locking tab on box and tape box closed securely.  The [employer] has [employer] on it.  Please place in mailbox as soon as possible.  Your physician should have your test results approximately 7 days after the monitor has been mailed back to [facility].   Call [place] at [phone] if you have questions regarding your ZIO XT patch monitor.  Call them immediately if you see an orange light blinking on your monitor.   If your monitor falls off in less than 4 days contact our Monitor department at [phone].  If your monitor becomes loose or falls off after 4 days call [employer] at [phone] for suggestions on securing your monitor.

## 2020-02-28 NOTE — Progress Notes (Signed)
Patient ID: Erica [name], female   DOB: 1975/10/13, 44 y.o.   MRN: [id number] Patient enrolled for [employer] to ship a 3 day ZIO XT long term holter monitor to her home.

## 2020-02-28 NOTE — Progress Notes (Signed)
Hypertension Clinic Follow up:    Date:  02/28/2020   ID:  Erica [name], DOB 1975-11-11, MRN [id number]  PCP:  [name], MD  Cardiologist:  Ena Dawley, MD  Nephrologist:  Referring MD: [name], MD   CC: Hypertension  History of Present Illness:    Erica [name] is a 44 y.o. female with a hx of resistant hypertension, chronic diastolic heart failure, breast cancer s/p chemo and XRT, morbid obesity and OSA intolerant of CPAP here to establish care in the hypertension clinic. She reports that her BP has been elevated since she was treated for breast cancer in 2010.  Since then her blood pressure has never been very well have been controlled.  She has been on multiple antihypertensives and her blood pressure remains in the 387F to 643P systolic.  She struggles with exertional dyspnea that makes it hard for her to walk even short distances.  Echo 03/2018 revealed LVEF 45 to 50% with grade 2 diastolic dysfunction.  There was mild MR.  PASP was 33.  [name] has a history of recurrent syncope.  She had an episode that occurred while wearing an ambulatory monitor and the episode was felt to be due to narcolepsy.  She has sleep apnea but is not tolerant of her CPAP machine.  She now uses her BIPAP machine but struggles with insomnia.  She wakes up hourly.  She underwent left and right heart catheterization on 05/04/2019.  She was found to have 60% distal left circumflex and 80% distal LAD disease.  Given her aspirin allergy Dr. Meda Coffee recommended that she start clopidogrel.  Right atrial pressure was 10, and PA pressure was 26/10.  PCWP 18.  LVEDP was 23 mmHg.  At her first hypertension clinic visit there were concerns about whether she was taking her medication as prescribed.  Discrepancies between what was ordered and what she was actually picking up from the pharmacy.  She continues to report taking all medications as prescribed.  Her blood pressures continue to run quite  elevated.  At her initial assessment doxazosin was added to her regimen.  This does not seem to have helped much.  She followed up with our pharmacist and was started on amlodipine.  She is unable to exercise due to poorly controlled blood pressure.  She has 1 caffeinated drink daily and does not use over-the-counter supplements or medications.  She continues to try to minimize her sodium intake.  Renal artery Dopplers revealed 1- 59% stenosis bilaterally in 2019.  CT-A of the abdomen showed a 1.3 cm renal cyst but no adrenal adenomas.  There was no evidence of renal artery stenosis.  She last saw our pharmacist on 12/2019.  She was in a lot of pain at the time.  Given her high number or medications and her pain, her medications were not changed.  She has struggled with LE edema.  LE Doppler was negative for DVT 10/2019.  She was given metolazone to take prior to torsemide.  For the last two months she has been more swollen but it is finally better.  Her weight has decreased from 211 lb to 187 lb.  [name] continues to struggle with shortness of breath. Her edema has resolved but there is no improvement in her breathing.  She uses her CPAP every other night.  She notes that there is a recall on her CPAP and she isn't supposed to be using it.  However, she can't sleep well without  it. Lately her BP at home has been in the 150-200s.  She notes that when she has pain it is higher.  She has a lot of joint pain, especially in her feel.  She tries to walk for exercise but is limited by pain.  She also notes exertional chest pain. She notes that hydralazine doesn't seem to really affect her blood pressure.  She would like to eliminate some of her medication.  She wonders if she should just stop everything and start all over.  At her last appointment Ms. Trefz wanted to reduce her medications.  However her blood pressure was significantly elevated.  We reduced hydralazine to 50 mg 3 times a day and increase doxazosin to  8 mg twice daily in an effort to try and get her off of her 3 times daily medications and improve compliance.  Ranexa was increased due to chest pain.  This seems to have helped her chest pain.  It seems to occur randomly both at rest and with exertion. She continues to have exertional intolerance. She is unable to mop her entire floor without stopping. She tries to get some exercise by playing with her grandchild but does not get any dedicated exercise. She notes that her blood pressure at home has been in the 150s. She has not missed any medications recently. She is due to take her morning medicines now. She has some chronic lower extremity edema that is unchanged from baseline. She denies orthopnea or PND. She reports daily episodes where she feels like her heart stopped beating for several seconds at a time. When it happens she cannot breathe. There is no associated chest pain or pressure. It occurs randomly and not necessarily with exertion.   Past Medical History:  Diagnosis Date  . Allergy   . Anxiety   . Apnea, sleep 05/04/2014   AHI 31/hr now on CPAP at 12cm H2O  . Arthritis   . Asthma   . Asthma without status asthmaticus 01/19/2016  . Axillary hidradenitis suppurativa 08/01/2011  . Blood transfusion without reported diagnosis   . Breast cancer ([place]) 2010   a. locally advanced left breast carcinoma diagnosed in 2010 and treated with neoadjuvant chemotherapy with docetaxel and cyclophosphamide as well as paclitaxel. This was followed by radiation therapy which was completed in 2011.  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 CHF (congestive heart failure) ([place])   . Chronic diastolic CHF (congestive heart failure) ([place]) 06/23/2013  . Chronic kidney disease   . Chronic pain in left foot 06/29/2018  . Cough variant asthma 09/27/2010   Followed in Pulmonary clinic/ [facility]/ [place]  Onset reported in childhood  - PFT's 01/31/2011 FEV1  1.76 (60% ) ratio 68 and no  better p B2,  DLCO 70% -med review 09/25/2012 > [place] drug assistance  paperwork given    - HFA 90% 11/20/2012 .  -Med calendar 12/07/12 , 04/06/2013 , 04/18/2014,, 08/30/2014 , 11/08/2014  - PFT's 01/12/2013  1.73 (65%)  Ratio 73 and no better p B2, DLCO 86% - trial off   . 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 Dyspnea on exertion    a. 01/2013 Lexi MV: EF 54%, no ischemia/infarct;  b. 05/2013 Echo: EF 60-65%, no rwma, Gr 2 DD;  b.   . Essential hypertension 10/09/2014   Estimated Creatinine Clearance: 82.4 mL/min (by C-G formula based on SCr of 0.98 mg/dL).  . Fatty liver   . Gastroparesis   . GERD (gastroesophageal reflux disease)   .  Hyperglycemia 04/13/2011  . Hypertension   . Hypertensive heart disease 09/26/2015  . Hypertriglyceridemia without hypercholesterolemia 06/15/2015   Framingham risk score is 1%   . Idiopathic intracranial hypertension 10/21/2014  . Impaired glucose tolerance 04/13/2011  . Insomnia secondary to anxiety 04/05/2015  . Lymphadenitis, chronic    restricted LEFT extremity  . Malignant neoplasm of upper-outer quadrant of left breast in female, estrogen receptor negative ([place]) 01/07/2013   Stage III, Invasive Ductal, s/p partial Left Mastectomy/Lumpectomy (baseball sized) with Lymph Node dissection, had Chemo and RadRx    . 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 OSA (obstructive sleep apnea) 05/04/2014   AHI 31/hr now on CPAP at 12cm H2O  . Oxygen deficiency    Uses 02 in CPAP  . Persistent proteinuria 03/31/2018  . Personal history of chemotherapy 2010  . Personal history of radiation therapy 2010  . Sleep apnea   . Tibialis posterior tendinitis, left 07/15/2018   Injected in March 10, 2019  . Upper airway cough syndrome 10/09/2014   Onset around 2012 with assoc pseudowheeze MRI 05/18/15 > Paranasal sinuses are clear - 11/17/2015 rec increase h1 to 2 hs to help with noct cough  - Allergy profile 11/17/2015 >  Eos 0.1 /  IgE  80 POS RAST  to grass, ragweed, cedar tree  - d/c  acei 03/06/2018  - flare end of Jan 2020 in setting of ? Uri/ poorly controlled GERD - flare mid Oct 2020 with uri / assoc secondary gerd  . Vitamin D deficiency disease 03/31/2018    Past Surgical History:  Procedure Laterality Date  . BRAIN SURGERY     [place]  . BREAST LUMPECTOMY Left 09/2008  . CARPAL TUNNEL RELEASE Left 2011  . LYMPH NODE DISSECTION Left 2010   breast; 2 wks after breast lumpectomy  . RIGHT/LEFT HEART CATH AND CORONARY ANGIOGRAPHY N/A 05/04/2019   Procedure: RIGHT/LEFT HEART CATH AND CORONARY ANGIOGRAPHY;  Surgeon: Jolaine Artist, MD;  Location: [facility];  Service: Cardiovascular;  Laterality: N/A;  Renal injection preformed   . TENDON RELEASE Right 2012   "hand"  . TUBAL LIGATION  05/11/2011   Procedure: POST PARTUM TUBAL LIGATION;  Surgeon: Emeterio Reeve, MD;  Location: [facility];  Service: Gynecology;  Laterality: Bilateral;  Induced for HTN    Current Medications: Current Meds  Medication Sig  . acetaminophen-codeine (TYLENOL #4) 300-60 MG tablet Take 1 tablet by mouth every 6 (six) hours as needed for pain.  Marland Kitchen acetaZOLAMIDE (DIAMOX) 250 MG tablet Take 4 tablets (1,000 mg total) by mouth 3 (three) times daily.  Marland Kitchen albuterol (PROVENTIL) (2.5 MG/3ML) 0.083% nebulizer solution USE 1 VIAL VIA NEBULIZER EVERY 4 HOURS AND AS NEEDED FOR WHEEZING OR SHORTNESS OF BREATH  . albuterol (VENTOLIN HFA) 108 (90 Base) MCG/ACT inhaler INHALE 2 PUFFS BY MOUTH EVERY 4 HOURS AS NEEDED FOR WHEEZE OR FOR SHORTNESS OF BREATH  . amitriptyline (ELAVIL) 100 MG tablet TAKE 1 TABLET BY MOUTH EVERYDAY AT BEDTIME  . Biotin 5000 MCG TABS Take 5,000 mg by mouth 2 (two) times daily.   . Cholecalciferol 1.25 MG (50000 UT) capsule Take 1 capsule (50,000 Units total) by mouth once a week.  . cloNIDine (CATAPRES - DOSED IN MG/24 HR) 0.3 mg/24hr patch PLACE 2 PATCHES ONTO SKIN ONCE WEEKLY  . clopidogrel (PLAVIX) 75 MG tablet Take 1 tablet (75 mg total) by mouth daily.  . clotrimazole (MYCELEX)  10 MG troche Take 1 tablet (10 mg total) by mouth 5 (  five) times daily.  Marland Kitchen dexlansoprazole (DEXILANT) 60 MG capsule Take 1 capsule (60 mg total) by mouth daily.  Marland Kitchen dextromethorphan-guaiFENesin (MUCINEX DM) 30-600 MG 12hr tablet Take 2 tablets by mouth 2 (two) times daily.   Marland Kitchen doxazosin (CARDURA) 8 MG tablet Take 1 tablet (8 mg total) by mouth 2 (two) times daily.  . DULoxetine (CYMBALTA) 30 MG capsule Take 1 capsule (30 mg total) by mouth daily. With 60mg  for total of 90 mg  . DULoxetine (CYMBALTA) 60 MG capsule Take 1 capsule (60 mg total) by mouth daily. For total of 90 mg  . EPINEPHRINE 0.3 mg/0.3 mL IJ SOAJ injection INJECT 0.3 MLS INTO THE MUSCLE ONCE AS NEEDED FOR ANAPHYLAXIS  . ezetimibe (ZETIA) 10 MG tablet Take 1 tablet (10 mg total) by mouth daily.  . famotidine (PEPCID) 20 MG tablet Take 1-2 tablets (20-40 mg total) by mouth at bedtime.  . gabapentin (NEURONTIN) 300 MG capsule Take 1 capsule (300 mg total) by mouth 3 (three) times daily. One three times daily  . hydrALAZINE (APRESOLINE) 50 MG tablet Take 1 tablet (50 mg total) by mouth 3 (three) times daily.  Marland Kitchen lidocaine-prilocaine (EMLA) cream Apply 1 application topically every 28 (twenty-eight) days. Apply 1 application to skin ever 28 days  . metoCLOPramide (REGLAN) 10 MG tablet Take 1 tablet (10 mg total) by mouth 4 (four) times daily.  . minoxidil (LONITEN) 2.5 MG tablet Take 2 tablets (5 mg total) by mouth 2 (two) times daily.  . montelukast (SINGULAIR) 10 MG tablet TAKE 1 TABLET BY MOUTH EVERYDAY AT BEDTIME  . Nebivolol HCl (BYSTOLIC) 20 MG TABS Take 2 tablets (40 mg total) by mouth daily.  Marland Kitchen NITROSTAT 0.4 MG SL tablet PLACE 1 TABLET UNDER TONGUE EVERY 5 MINUTES AS NEEDED FOR CHEST PAIN, MAX OF 3 TABLETS THEN CALL 911 IF PAIN PERSISTS  . Olmesartan-amLODIPine-HCTZ 40-10-25 MG TABS Take 1 tablet by mouth daily.  Marland Kitchen oxymetazoline (MUCINEX SINUS-MAX FULL FORCE) 0.05 % nasal spray Place 1 spray into both nostrils 2 (two) times daily.    . Potassium Chloride ER 20 MEQ TBCR TAKE 1 TABLET BY MOUTH TWICE A DAY  . primidone (MYSOLINE) 50 MG tablet Take 0.5 tablets (25 mg total) by mouth at bedtime.  . promethazine (PHENERGAN) 25 MG suppository Place 25 mg rectally every 6 (six) hours as needed for nausea or vomiting.   . promethazine (PHENERGAN) 25 MG tablet Take 1 tablet (25 mg total) by mouth 3 (three) times daily as needed for nausea or vomiting.  . promethazine-dextromethorphan (PROMETHAZINE-DM) 6.25-15 MG/5ML syrup Take 5 mLs by mouth 4 (four) times daily as needed for cough.  . ranolazine (RANEXA) 1000 MG SR tablet Take 1 tablet (1,000 mg total) by mouth 2 (two) times daily.  Marland Kitchen Respiratory Therapy Supplies (FLUTTER) DEVI Use as directed  . rosuvastatin (CRESTOR) 40 MG tablet Take 1 tablet (40 mg total) by mouth daily.  Marland Kitchen Spacer/Aero-Holding Chambers (AEROCHAMBER MV) inhaler Use as instructed  . spironolactone (ALDACTONE) 100 MG tablet TAKE 1 TABLET BY MOUTH EVERY DAY  . SYMBICORT 160-4.5 MCG/ACT inhaler INHALE 2 PUFFS INTO LUNGS 2 TIMES DAILY AS DIRECTED (Patient taking differently: Inhale 2 puffs into the lungs 2 (two) times daily. )  . torsemide (DEMADEX) 20 MG tablet Take 1 tablet (20 mg total) by mouth 2 (two) times daily.  . [DISCONTINUED] cloNIDine (CATAPRES - DOSED IN MG/24 HR) 0.3 mg/24hr patch PLACE 1 PATCH ONTO SKIN ONCE WEEKLY  . [DISCONTINUED] cloNIDine (CATAPRES - DOSED IN MG/24  HR) 0.3 mg/24hr patch PLACE 2 PATCHES ONTO SKIN ONCE WEEKLY     Allergies:   Aspirin, Doxycycline, Kiwi extract, Metoprolol, Penicillins, Strawberry extract, and Sulfamethoxazole-trimethoprim   Social History   Socioeconomic History  . Marital status: Single    Spouse name: Not on file  . Number of children: 3  . Years of education: Not on file  . Highest education level: Not on file  Occupational History    Employer: UNEMPLOYED  Tobacco Use  . Smoking status: Never Smoker  . Smokeless tobacco: Never Used  Vaping Use  . Vaping  Use: Never used  Substance and Sexual Activity  . Alcohol use: Yes    Alcohol/week: 0.0 standard drinks    Comment: occasionally/socially  . Drug use: No  . Sexual activity: Yes    Birth control/protection: Surgical  Other Topics Concern  . Not on file  Social History Narrative   She lives with four children.   She is currently not working (disbaility pending).   Left handed   One story home   Drinks caffeine      Social Determinants of Health   Financial Resource Strain:   . Difficulty of Paying Living Expenses: Not on file  Food Insecurity: Food Insecurity Present  . Worried About Charity fundraiser in the Last Year: Often true  . Ran Out of Food in the Last Year: Often true  Transportation Needs:   . Lack of Transportation (Medical): Not on file  . Lack of Transportation (Non-Medical): Not on file  Physical Activity:   . Days of Exercise per Week: Not on file  . Minutes of Exercise per Session: Not on file  Stress:   . Feeling of Stress : Not on file  Social Connections:   . Frequency of Communication with Friends and Family: Not on file  . Frequency of Social Gatherings with Friends and Family: Not on file  . Attends Religious Services: Not on file  . Active Member of Clubs or Organizations: Not on file  . Attends Archivist Meetings: Not on file  . Marital Status: Not on file     Family History: The patient's family history includes Breast cancer in some other family members; CAD in her maternal grandmother; Diabetes in her maternal grandmother; Fibroids (age of onset: 22) in her sister; Heart disease in her maternal grandmother; Hypertension in her maternal grandmother; Other (age of onset: 44) in her mother. There is no history of Cancer, Alcohol abuse, Early death, Hyperlipidemia, Kidney disease, Stroke, Colon cancer, Esophageal cancer, Stomach cancer, or Rectal cancer.  ROS:   Please see the history of present illness.     All other systems reviewed  and are negative.  EKGs/Labs/Other Studies Reviewed:    EKG:  EKG is not ordered today.  The ekg ordered 04/23/19 demonstrates sinus tachycardia.  Rate 124 bpm.  LVH  Recent Labs: 04/30/2019: NT-Pro BNP 346; TSH 1.470 12/09/2019: ALT 22; BUN 13; Creatinine, Ser 1.45; Hemoglobin 11.6; Platelets 281; Potassium 3.3; Sodium 139   Recent Lipid Panel    Component Value Date/Time   CHOL 176 06/04/2019 0825   TRIG 86 06/04/2019 0825   HDL 42 06/04/2019 0825   CHOLHDL 4.2 06/04/2019 0825   CHOLHDL 4 10/01/2018 1115   VLDL 22.6 10/01/2018 1115   LDLCALC 118 (H) 06/04/2019 0825   LDLDIRECT 152.0 03/30/2018 1604    Echo 03/12/18: Study Conclusions  - Left ventricle: The cavity size was normal. Wall thickness was  increased in  a pattern of moderate LVH. Systolic function was  mildly reduced. The estimated ejection fraction was in the range  of 45% to 50%. Wall motion was normal; there were no regional  wall motion abnormalities. Features are consistent with a  pseudonormal left ventricular filling pattern, with concomitant  abnormal relaxation and increased filling pressure (grade 2  diastolic dysfunction).  - Mitral valve: There was mild regurgitation.  - Pulmonary arteries: Systolic pressure was mildly increased. PA  peak pressure: 33 mm Hg (S).   Lexiscan Myoview 04/20/18:   The left ventricular ejection fraction is moderately decreased (30-44%).  Nuclear stress EF: 41%.  There was no ST segment deviation noted during stress.  This is an intermediate risk study.   Abnormal, intermediate risk stress nuclear study with no ischemia or infarction; EF 41 with global hypokinesis and mild LVE; study intermediate risk due to reduced LV function.  Renal artery Doppler 10/29/18: Right: 1-59% stenosis of the right renal artery. Cyst(s) noted.  Left: 1-59% stenosis of the left renal artery.    LHC/RHC 05/04/19:  Dist LAD-1 lesion is 30% stenosed.  Dist LAD-2 lesion is  80% stenosed.  Dist Cx lesion is 60% stenosed.   Findings:  Ao = 173/104 (134) LV =  181/25 RA =  10 RV =  41/13 PA =  42/10 (26) PCW = 18 Fick cardiac output/index = 5.8/2.9 PVR = 1.4 wu Ao sat = 98% PA sat = 73%, 73%  Assessment: 1. 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 LVEF 55-60% 3. Mildly elevated filling pressures with mild pulmonary venous HTN m4. Widely patent bilateral renal arteries. No evidence of AAA 5. Severe systemic HTN    Physical Exam:    Wt Readings from Last 3 Encounters:  02/28/20 195 lb (88.5 kg)  02/10/20 194 lb 12.8 oz (88.4 kg)  01/21/20 187 lb (84.8 kg)    VS:  BP (!) 172/84   Pulse 86   Wt 195 lb (88.5 kg)   SpO2 97%   BMI 31.47 kg/m  , BMI Body mass index is 31.47 kg/m. GENERAL:  Well appearing HEENT: Pupils equal round and reactive, fundi not visualized, oral mucosa unremarkable NECK:  No jugular venous distention, waveform within normal limits, carotid upstroke brisk and symmetric, no bruits LUNGS:  Clear to auscultation bilaterally HEART:  RRR.  PMI not displaced or sustained,S1 and S2 within normal limits, no S3, no S4, no clicks, no rubs, II/VI systolic murmur at the LUSB ABD:  Flat, positive bowel sounds normal in frequency in pitch, no bruits, no rebound, no guarding, no midline pulsatile mass, no hepatomegaly, no splenomegaly EXT:  2 plus pulses throughout, no edema, no cyanosis no clubbing SKIN:  No rashes no nodules NEURO:  Cranial nerves II through XII grossly intact, motor grossly intact throughout PSYCH:  Cognitively intact, oriented to person place and time   ASSESSMENT:    1. Essential hypertension   2. Chronic diastolic CHF (congestive heart failure) ([place])   3. Hypertensive heart disease with chronic combined systolic and diastolic congestive heart failure ([place])   4. Obstructive sleep apnea syndrome   5. Palpitations   6. Idiopathic intracranial hypertension     PLAN:    # Resistant hypertension:  Ms.  Sandquist BP is elevated but improving. We will increase her clonidine patch to 0.6 mg every 24 hours. Continue nebivolol, doxazosin, hydralazine, minoxidil, olmesartan/amlodipine/HCTZ, and spironolactone. Her potassium was low when last checked in September. This is all out given that she is on  spironolactone. Her renal function was also worse. We will repeat a CMP today.   # OSA:  Continue BiPAP.   # CAD:  # Hyperlipidemia: She previously reported exertional chest pain and has 80% distal LAD disease.  She doesn't tolerate Imdur 2/2 headaches. It seems to have improved with higher doses of Ranexa.  Continue clopidogrel, ranolazine, Zetia and increase nebivolol as above. Check lipids and a CMP  # Palpitations:  Check 3-day ZIO monitor. Checking a CMP and magnesium as above. Can add a magnesium  Disposition:    F/u in 6 weeks     Medication Adjustments/Labs and Tests Ordered: Current medicines are reviewed at length with the patient today.  Concerns regarding medicines are outlined above.  Orders Placed This Encounter  Procedures  . CBC with Differential/Platelet  . Lipid panel  . Comprehensive metabolic panel  . Magnesium  . LONG TERM MONITOR (3-14 DAYS)   Meds ordered this encounter  Medications  . DISCONTD: cloNIDine (CATAPRES - DOSED IN MG/24 HR) 0.3 mg/24hr patch    Sig: PLACE 2 PATCHES ONTO SKIN ONCE WEEKLY    Dispense:  12 patch    Refill:  1    NEW DOSE, D/C PREVIOUS RX  . cloNIDine (CATAPRES - DOSED IN MG/24 HR) 0.3 mg/24hr patch    Sig: PLACE 2 PATCHES ONTO SKIN ONCE WEEKLY    Dispense:  24 patch    Refill:  1    NEW DOSE, D/C PREVIOUS RX     Signed, Skeet Latch, MD  02/28/2020 8:35 AM    [place]

## 2020-02-29 ENCOUNTER — Telehealth: Payer: Self-pay | Admitting: Licensed Clinical Social Worker

## 2020-02-29 NOTE — Progress Notes (Signed)
Heart and Vascular Care Navigation  02/29/2020  Erica [name] [birth year] [id number]  Reason for Referral:  Housing/Previous contact with Care Navigation team                                                                                           Assessment: CSW spoke with pt via telephone [phone]) after Care Navigation team had received referral from [place]. Pt had previously spoken w/ CSW [place] in February and Care Guide Amy in June. Pt lives w/ two minor children in a mobile home; she is not currently employed, receives [employer]. Her parents are supportive, they live in [facility] [place] but she speaks with them regularly via telephone. She has access to phone and internet, she does not regularly check her email. Her current rent (some utilities included) is >$1000 a month. This has been steep for her to afford. She is interested in understanding what other options are available. She states she had not yet applied for Section 8 as she thought that this office was going to be back in touch when she previously spoke w/ Care Guide.  Pt does have [place] Medicare and Medicaid which still is indicated as Medicaid of [place] in our system. Pt states she sent in a new Medicaid application this summer but hasn't heard back at this time. Pt has not applied for food stamps but is interested in doing so if possible. She has been able to cover her . Pt has been able to afford her medications, she has transportation through friends and declines referral to [place] at this time.   CSW shared that I will gather resources and be in touch with her moving forward. Pt has this writers name and number for any additional questions.                                         HRT/VAS Care Coordination     Patients Home Cardiology Office [place] Team Social Worker   Social Worker Name: Westley Hummer, LCSW, [facility]   Living arrangements for the past 2 months Mobile Home    Lives with: Minor Children   Patient Current Insurance Coverage Medicaid; Managed Medicare   Patient Has Concern With Paying Medical Bills No   Does Patient Have Prescription Coverage? Yes   Home Assistive Devices/Equipment None       Social History:                                                                             SDOH Screenings   Alcohol Screen: Low Risk    Last Alcohol Screening Score (AUDIT): 0  Depression (PHQ2-9): Low Risk    PHQ-2 Score: 1  Financial Resource Strain:  Medium Risk   Difficulty of Paying Living Expenses: Somewhat hard  Food Insecurity: Food Insecurity Present   Worried About Charity fundraiser in the Last Year: Often true   Ran Out of Food in the Last Year: Often true  Housing: High Risk   Last Housing Risk Score: 2  Physical Activity:    Days of Exercise per Week: Not on file   Minutes of Exercise per Session: Not on file  Social Connections:    Frequency of Communication with Friends and Family: Not on file   Frequency of Social Gatherings with Friends and Family: Not on file   Attends Religious Services: Not on file   Active Member of Clubs or Organizations: Not on file   Attends Archivist Meetings: Not on file   Marital Status: Not on file  Stress:    Feeling of Stress : Not on file  Tobacco Use: Low Risk    Smoking Tobacco Use: Never Smoker   Smokeless Tobacco Use: Never Used  Transportation Needs: No Data processing manager (Medical): No   Lack of Transportation (Non-Medical): No    SDOH Interventions: Food Insecurity:   Food Stamp Referral to [place]:  Housing Interventions: Other (Comment) (Social Serve, Section 8 HUD contact information, [place] information)  Transportation:   Transportation Interventions: Intervention Not Indicated     Follow-up plan:   CSW will gather housing resources (affordable housing units, socialserve.com listings, HUD  contact information etc), will also start referral for food stamp application to send to [facility] team to assist with. CSW will touch base with pt to have her come by the clinic to gather items and meet w/ CSW.

## 2020-03-02 ENCOUNTER — Telehealth: Payer: Self-pay | Admitting: Licensed Clinical Social Worker

## 2020-03-02 NOTE — Telephone Encounter (Signed)
CSW spoke with pt via telephone. Reintroduced self, role, reason for visit.   Pt agreeable to meeting with CSW tomorrow morning at 8:30am to discuss community resources and assistance.   Westley Hummer, MSW, [place] Work

## 2020-03-03 ENCOUNTER — Telehealth: Payer: Self-pay | Admitting: Licensed Clinical Social Worker

## 2020-03-03 NOTE — Progress Notes (Signed)
CSW met with pt in the clinic this morning. CSW reviewed pt needs (food affordability challenges, housing affordability challenges, and financial pressures).  CSW provided pt with the following: - [place] food pantry list/COVID distribution information -  [employer] office information to contact them regarding opening of Section 8 list - ERAP/ CARES information along w/ the contact number for [employer] for Housing and [employer] Studies department to assist w/ completion of those applications for rental/utility assistance and landlord/eviction mediation.  - SocialServe list of apartments more within pt budget  - two applications for local income based housing along w/ contact information   Pt also signed release for the [employer] to assist her with food stamp application.  Pt and CSW discussed ongoing stressors including chronic health needs, family dynamics, and financial strains. Pt has gotten supportive counseling in the past and found it helpful. She is interested in information about supportive counseling in the area as well as information about PCPs in the area. She is agreeable to CSW mailing this for her to review at home.   CSW ensured pt has my number for any additional questions that may arise.  CSW sent referral form to [facility] for food stamp assistance.  CSW will f/u with pt next week to assess any additional questions/check on progress of looking into resources.   Westley Hummer, MSW, [place]  [phone]

## 2020-03-05 ENCOUNTER — Ambulatory Visit (INDEPENDENT_AMBULATORY_CARE_PROVIDER_SITE_OTHER): Payer: Medicare Other

## 2020-03-05 DIAGNOSIS — R002 Palpitations: Secondary | ICD-10-CM | POA: Diagnosis not present

## 2020-03-07 ENCOUNTER — Telehealth: Payer: Self-pay | Admitting: Licensed Clinical Social Worker

## 2020-03-07 NOTE — Telephone Encounter (Signed)
CSW left a HIPAA compliant message at [phone] for pt to f/u on some of the resources that were provided to her during our meeting last week. CSW also has update from Development worker, community who reached out to DSS, no new Medicaid on file. Pt needs to reach out to DSS here in [facility] to let them know she has moved and to reapply for Medicaid. I can provide pt with a new application and advise her on how to apply online if able.  Will f/u again as able.  Westley Hummer, MSW, [place]  [phone]

## 2020-03-08 ENCOUNTER — Telehealth: Payer: Self-pay | Admitting: Licensed Clinical Social Worker

## 2020-03-08 ENCOUNTER — Other Ambulatory Visit: Payer: Self-pay | Admitting: Internal Medicine

## 2020-03-08 ENCOUNTER — Telehealth: Payer: Self-pay | Admitting: *Deleted

## 2020-03-08 DIAGNOSIS — Z1211 Encounter for screening for malignant neoplasm of colon: Secondary | ICD-10-CM

## 2020-03-08 NOTE — Telephone Encounter (Signed)
-----   Message from Larina Bras, [place] sent at 02/10/2020 10:27 AM EST ----- Pt needs hospital colonoscopy screening in January or February 2022. Will need plavix clearance with tiffany Killbuck. See 02/10/20 office note. Also take patient off wait list if we get her an appt

## 2020-03-08 NOTE — Telephone Encounter (Signed)
CSW called and spoke w/ pt via telephone. Pt shares she does not have any current questions for this Probation officer regarding resources provided. I shared the information that had been provided to me by financial counseling. She understands that she needs to call [place] to let them know about her residency change and need to reapply for [facility] Medicaid. Pt gives permission for me to send her a Medicaid application through the mail. Application and a letter with my number and contact information to pt.  She is aware I am available as needed for any additional questions.   Westley Hummer, MSW, [place]  [phone]

## 2020-03-08 NOTE — Telephone Encounter (Signed)
Patient has been scheduled for colonoscopy with propofol at [facility] on 07/04/20@ 9:15 am (7:45 am arrival). Unfortunately, all hospital dates for February have already been taken. Patient is scheduled for COVID testing at our hospital testing site on 06/30/20 at 1:50 pm. Patient is also scheduled for previsit so she can receive detailed prep instructions regarding her procedure on 06/19/20 at 9:00 am.  I have spoken to patient to advise that I have scheduled her for colonoscopy so if she gets any calls regarding this, she is aware. I advised that I will give her more detail as the times get closer. In addition, I will get anticoagulation clearance from Tiffany Gwinner/Dr Meda Coffee as it gets closer to patient's appointment date. Patient verbalizes understanding of this information.

## 2020-03-09 ENCOUNTER — Inpatient Hospital Stay: Payer: Medicare Other | Attending: Oncology

## 2020-03-09 DIAGNOSIS — Z79818 Long term (current) use of other agents affecting estrogen receptors and estrogen levels: Secondary | ICD-10-CM | POA: Insufficient documentation

## 2020-03-09 DIAGNOSIS — C50412 Malignant neoplasm of upper-outer quadrant of left female breast: Secondary | ICD-10-CM | POA: Insufficient documentation

## 2020-03-10 ENCOUNTER — Inpatient Hospital Stay: Payer: Medicare Other

## 2020-03-10 ENCOUNTER — Telehealth: Payer: Self-pay | Admitting: *Deleted

## 2020-03-10 ENCOUNTER — Other Ambulatory Visit: Payer: Self-pay

## 2020-03-10 VITALS — BP 182/101 | HR 83 | Temp 98.5°F | Resp 18

## 2020-03-10 DIAGNOSIS — C50412 Malignant neoplasm of upper-outer quadrant of left female breast: Secondary | ICD-10-CM

## 2020-03-10 DIAGNOSIS — Z79818 Long term (current) use of other agents affecting estrogen receptors and estrogen levels: Secondary | ICD-10-CM | POA: Diagnosis present

## 2020-03-10 MED ORDER — GOSERELIN ACETATE 3.6 MG ~~LOC~~ IMPL
3.6000 mg | DRUG_IMPLANT | SUBCUTANEOUS | Status: DC
  Administered 2020-03-10: 3.6 mg via SUBCUTANEOUS

## 2020-03-10 MED ORDER — GOSERELIN ACETATE 3.6 MG ~~LOC~~ IMPL
DRUG_IMPLANT | SUBCUTANEOUS | Status: AC
  Filled 2020-03-10: qty 3.6

## 2020-03-10 NOTE — Patient Instructions (Signed)

## 2020-03-10 NOTE — Telephone Encounter (Signed)
Pt here today for zoladex injection with noted bp today of 182/101.   She has not taken her am bp medications at this time.  Pt is under follow up with primary MD for bp with recent visit noted in [place].  Per MD ok to proceed with injection- pt is to take bp med asap.  Informed treatment room RN.

## 2020-03-13 ENCOUNTER — Telehealth: Payer: Self-pay | Admitting: Licensed Clinical Social Worker

## 2020-03-13 NOTE — Telephone Encounter (Signed)
CSW f/u with pt to assess if any additional questions regarding pt resources provided.  No answer at this time, will f/u as able to assist w/ any questions or ongoing concerns.  Westley Hummer, MSW, [place]  [phone]

## 2020-03-13 NOTE — Telephone Encounter (Signed)
Received message back from pt that she had been successful with reaching assistance for utility and rental support through [employer] for Housing and [employer] and is pleased with that help. She is aware CSW remains available as needed for any additional support and resources.   Westley Hummer, MSW, [place]  [phone]

## 2020-03-16 ENCOUNTER — Telehealth: Payer: Self-pay | Admitting: Licensed Clinical Social Worker

## 2020-03-16 NOTE — Telephone Encounter (Signed)
F/u with pt regarding the status of reaching out to DSS to change her Medicaid of [place] to Medicaid of [place]. Pt shares she completed the application that I sent to her last week and is waiting a bit to see if it has been received. CSW remains available as needed for any additional pt care navigation needs. Pt has my name, number and contact information.   Westley Hummer, MSW, [place]  [phone]

## 2020-03-20 NOTE — Progress Notes (Signed)
Call placed to pt reference PREP class starting 04/03/20. Able to do 10am-1115a at [place] scheduled for 03/29/20 at 10am.

## 2020-03-29 NOTE — Progress Notes (Signed)
[facility] Report   Patient Details  Name: Erica [name] MRN: [id number] Date of Birth: 11/07/1975 Age: 44 y.o. PCP: [name], MD  Vitals:   03/29/20 1551  BP: (!) 210/82  Pulse: 92  SpO2: 96%  Weight: 200 lb 3.2 oz (90.8 kg)  Height: 5\' 6"  (1.676 m)      [facility] Eval - 03/29/20 1500      Referral    Referring Provider Oval Linsey    Reason for referral Hypertension;Inactivity;Obesitity/Overweight    Program Start Date 04/17/20   M/W 10am to 1115am x 12 wks     Measurement   Waist Circumference 44 inches    Hip Circumference 44 inches    Body fat 35.5 percent      Information for Trainer   Goals BP control, get energy and feel better    Current Exercise none get SOB    Orthopedic Concerns none    Pertinent Medical History HTN, CHF, CKD, Apnea, breast cancer, pseudotumor    Restrictions/Precautions Fall risk;Assistive device    Medications that affect exercise Beta blocker;Oxgen;Asthma inhaler;Medication causing dizziness/drowsiness      Timed Up and Go (TUGS)   Timed Up and Go Moderate risk 10-12 seconds      Mobility and Daily Activities   I find it easy to walk up or down two or more flights of stairs. 1    I have no trouble taking out the trash. 3    I do housework such as vacuuming and dusting on my own without difficulty. 1    I can easily lift a gallon of milk (8lbs). 2    I can easily walk a mile. 1    I have no trouble reaching into high cupboards or reaching down to pick up something from the floor. 2    I do not have trouble doing out-door work such as Armed forces logistics/support/administrative officer, raking leaves, or gardening. 1      Mobility and Daily Activities   I feel younger than my age. 1    I feel independent. 1    I feel energetic. 1    I live an active life.  1    I feel strong. 2    I feel healthy. 1    I feel active as other people my age. 1      How fit and strong are you.   Fit and Strong Total Score 19          Past Medical History:   Diagnosis Date  . Allergy   . Anxiety   . Apnea, sleep 05/04/2014   AHI 31/hr now on CPAP at 12cm H2O  . Arthritis   . Asthma   . Asthma without status asthmaticus 01/19/2016  . Axillary hidradenitis suppurativa 08/01/2011  . Blood transfusion without reported diagnosis   . Breast cancer ([place]) 2010   a. locally advanced left breast carcinoma diagnosed in 2010 and treated with neoadjuvant chemotherapy with docetaxel and cyclophosphamide as well as paclitaxel. This was followed by radiation therapy which was completed in 2011.  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 CHF (congestive heart failure) ([place])   . Chronic diastolic CHF (congestive heart failure) ([place]) 06/23/2013  . Chronic kidney disease   . Chronic pain in left foot 06/29/2018  . Cough variant asthma 09/27/2010   Followed in Pulmonary clinic/ [facility]/ [facility]  Onset reported in childhood  - PFT's 01/31/2011 FEV1  1.76 (60% ) ratio  68 and no better p B2,  DLCO 70% -med review 09/25/2012 > [place] drug assistance  paperwork given    - HFA 90% 11/20/2012 .  -Med calendar 12/07/12 , 04/06/2013 , 04/18/2014,, 08/30/2014 , 11/08/2014  - PFT's 01/12/2013  1.73 (65%)  Ratio 73 and no better p B2, DLCO 86% - trial off   . 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 Dyspnea on exertion    a. 01/2013 Lexi MV: EF 54%, no ischemia/infarct;  b. 05/2013 Echo: EF 60-65%, no rwma, Gr 2 DD;  b.   . Essential hypertension 10/09/2014   Estimated Creatinine Clearance: 82.4 mL/min (by C-G formula based on SCr of 0.98 mg/dL).  . Fatty liver   . Gastroparesis   . GERD (gastroesophageal reflux disease)   . Hyperglycemia 04/13/2011  . Hypertension   . Hypertensive heart disease 09/26/2015  . Hypertriglyceridemia without hypercholesterolemia 06/15/2015   F[place]ingham risk score is 1%   . Idiopathic intracranial hypertension 10/21/2014  . Impaired glucose tolerance 04/13/2011  . Insomnia secondary to anxiety 04/05/2015  . Lymphadenitis, chronic     restricted LEFT extremity  . Malignant neoplasm of upper-outer quadrant of left breast in female, estrogen receptor negative (HCC) 01/07/2013   Stage III, Invasive Ductal, s/p partial Left Mastectomy/Lumpectomy (baseball sized) with Lymph Node dissection, had Chemo and RadRx    . 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 OSA (obstructive sleep apnea) 05/04/2014   AHI 31/hr now on CPAP at 12cm H2O  . Oxygen deficiency    Uses 02 in CPAP  . Persistent proteinuria 03/31/2018  . Personal history of chemotherapy 2010  . Personal history of radiation therapy 2010  . Sleep apnea   . Tibialis posterior tendinitis, left 07/15/2018   Injected in March 10, 2019  . Upper airway cough syndrome 10/09/2014   Onset around 2012 with assoc pseudowheeze MRI 05/18/15 > Paranasal sinuses are clear - 11/17/2015 rec increase h1 to 2 hs to help with noct cough  - Allergy profile 11/17/2015 >  Eos 0.1 /  IgE  80 POS RAST  to grass, ragweed, cedar tree  - d/c acei 03/06/2018  - flare end of Jan 2020 in setting of ? Uri/ poorly controlled GERD - flare mid Oct 2020 with uri / assoc secondary gerd  . Vitamin D deficiency disease 03/31/2018   Past Surgical History:  Procedure Laterality Date  . BRAIN SURGERY     [place]  . BREAST LUMPECTOMY Left 09/2008  . CARPAL TUNNEL RELEASE Left 2011  . LYMPH NODE DISSECTION Left 2010   breast; 2 wks after breast lumpectomy  . RIGHT/LEFT HEART CATH AND CORONARY ANGIOGRAPHY N/A 05/04/2019   Procedure: RIGHT/LEFT HEART CATH AND CORONARY ANGIOGRAPHY;  Surgeon: Dolores Patty, MD;  Location: [facility];  Service: Cardiovascular;  Laterality: N/A;  Renal injection preformed   . TENDON RELEASE Right 2012   "hand"  . TUBAL LIGATION  05/11/2011   Procedure: POST PARTUM TUBAL LIGATION;  Surgeon: Scheryl Darter, MD;  Location: [facility];  Service: Gynecology;  Laterality: Bilateral;  Induced for HTN   Social History   Tobacco Use  Smoking Status Never Smoker  Smokeless Tobacco Never Used     Increase water to 1/2 body weight in ounces, get salt to 1500 mg per day, prioritize sleep and eat vegetables.   Bonnye Fava 03/29/2020, 3:56 PM

## 2020-04-04 ENCOUNTER — Other Ambulatory Visit: Payer: Self-pay | Admitting: Neurology

## 2020-04-06 ENCOUNTER — Inpatient Hospital Stay: Payer: Medicare Other | Attending: Oncology

## 2020-04-06 ENCOUNTER — Other Ambulatory Visit: Payer: Self-pay

## 2020-04-06 VITALS — BP 161/78 | HR 96 | Resp 18

## 2020-04-06 DIAGNOSIS — C50412 Malignant neoplasm of upper-outer quadrant of left female breast: Secondary | ICD-10-CM | POA: Insufficient documentation

## 2020-04-06 DIAGNOSIS — Z79818 Long term (current) use of other agents affecting estrogen receptors and estrogen levels: Secondary | ICD-10-CM | POA: Insufficient documentation

## 2020-04-06 MED ORDER — GOSERELIN ACETATE 3.6 MG ~~LOC~~ IMPL
DRUG_IMPLANT | SUBCUTANEOUS | Status: AC
  Filled 2020-04-06: qty 3.6

## 2020-04-06 MED ORDER — GOSERELIN ACETATE 3.6 MG ~~LOC~~ IMPL
3.6000 mg | DRUG_IMPLANT | Freq: Once | SUBCUTANEOUS | Status: AC
  Administered 2020-04-06: 3.6 mg via SUBCUTANEOUS

## 2020-04-06 NOTE — Patient Instructions (Signed)

## 2020-04-10 ENCOUNTER — Ambulatory Visit (INDEPENDENT_AMBULATORY_CARE_PROVIDER_SITE_OTHER): Payer: Medicare Other | Admitting: Cardiovascular Disease

## 2020-04-10 ENCOUNTER — Other Ambulatory Visit: Payer: Self-pay

## 2020-04-10 ENCOUNTER — Encounter: Payer: Self-pay | Admitting: Cardiovascular Disease

## 2020-04-10 VITALS — BP 148/80 | HR 88 | Ht 66.0 in | Wt 206.0 lb

## 2020-04-10 DIAGNOSIS — G4733 Obstructive sleep apnea (adult) (pediatric): Secondary | ICD-10-CM

## 2020-04-10 DIAGNOSIS — E785 Hyperlipidemia, unspecified: Secondary | ICD-10-CM

## 2020-04-10 DIAGNOSIS — R0600 Dyspnea, unspecified: Secondary | ICD-10-CM

## 2020-04-10 DIAGNOSIS — I10 Essential (primary) hypertension: Secondary | ICD-10-CM

## 2020-04-10 DIAGNOSIS — R0609 Other forms of dyspnea: Secondary | ICD-10-CM

## 2020-04-10 LAB — BASIC METABOLIC PANEL
BUN/Creatinine Ratio: 14 (ref 9–23)
BUN: 15 mg/dL (ref 6–24)
CO2: 25 mmol/L (ref 20–29)
Calcium: 9.4 mg/dL (ref 8.7–10.2)
Chloride: 102 mmol/L (ref 96–106)
Creatinine, Ser: 1.07 mg/dL — ABNORMAL HIGH (ref 0.57–1.00)
GFR calc Af Amer: 72 mL/min/{1.73_m2} (ref >59)
GFR calc non Af Amer: 62 mL/min/{1.73_m2} (ref >59)
Glucose: 119 mg/dL — ABNORMAL HIGH (ref 65–99)
Potassium: 3.6 mmol/L (ref 3.5–5.2)
Sodium: 143 mmol/L (ref 134–144)

## 2020-04-10 LAB — MAGNESIUM: Magnesium: 2 mg/dL (ref 1.6–2.3)

## 2020-04-10 MED ORDER — HYDROCHLOROTHIAZIDE 25 MG PO TABS: 25.0000 mg | ORAL_TABLET | Freq: Every day | ORAL | 3 refills | Status: DC

## 2020-04-10 MED ORDER — SACUBITRIL-VALSARTAN 97-103 MG PO TABS: 1.0000 | ORAL_TABLET | Freq: Two times a day (BID) | ORAL | 5 refills | Status: DC

## 2020-04-10 MED ORDER — AMLODIPINE BESYLATE 10 MG PO TABS: 10.0000 mg | ORAL_TABLET | Freq: Every day | ORAL | 3 refills | Status: DC

## 2020-04-10 NOTE — Patient Instructions (Addendum)
Medication Instructions:  START ENTRESTO 97-103 MG TWICE  A DAY   STOP TRIBENZOR (OLMESARTAN-AMLODIPINE--HCTZ)   TAKE YOUR BYSTOLIC IN THE EVENING   TAKE AMLODIPINE 10 MG DAILY   TAKE HYDROCHLOROTHIAZIDE 25 MG DAILY   Labwork: BMET/MAGNESIUM TODAY   Testing/Procedures: NONE  Follow-Up:  06/16/2020 AT 8:00 AM WITH DR [facility]

## 2020-04-10 NOTE — Progress Notes (Signed)
Hypertension Clinic Follow up:    Date:  04/10/2020   ID:  Erica [name], DOB 04/21/1975, MRN [id number]  PCP:  [name], MD  Cardiologist:  Ena Dawley, MD  Nephrologist:  Referring MD: [name], MD   CC: Hypertension  History of Present Illness:    Erica [name] is a 45 y.o. female with a hx of resistant hypertension, chronic diastolic heart failure, breast cancer s/p chemo and XRT, morbid obesity and OSA intolerant of CPAP here to establish care in the hypertension clinic. She reports that her BP has been elevated since she was treated for breast cancer in 2010.  Since then her blood pressure has never been very well have been controlled.  She has been on multiple antihypertensives and her blood pressure remains in the [id number] to [id number] systolic.  She struggles with exertional dyspnea that makes it hard for her to walk even short distances.  Echo 03/2018 revealed LVEF 45 to 50% with grade 2 diastolic dysfunction.  There was mild MR.  PASP was 33.  Erica [name] has a history of recurrent syncope.  She had an episode that occurred while wearing an ambulatory monitor and the episode was felt to be due to narcolepsy.  She has sleep apnea but is not tolerant of her CPAP machine.  She now uses her BIPAP machine but struggles with insomnia.  She wakes up hourly.  She underwent left and right heart catheterization on 05/04/2019.  She was found to have 60% distal left circumflex and 80% distal LAD disease.  Given her aspirin allergy Dr. Meda Coffee recommended that she start clopidogrel.  Right atrial pressure was 10, and PA pressure was 26/10.  PCWP 18.  LVEDP was 23 mmHg.  At her first hypertension clinic visit there were concerns about whether she was taking her medication as prescribed.  Discrepancies between what was ordered and what she was actually picking up from the pharmacy.  She continues to report taking all medications as prescribed.  Her blood pressures continue to run quite elevated.   At her initial assessment doxazosin was added to her regimen.  This does not seem to have helped much.  She followed up with our pharmacist and was started on amlodipine.  She is unable to exercise due to poorly controlled blood pressure.  She has 1 caffeinated drink daily and does not use over-the-counter supplements or medications.  She continues to try to minimize her sodium intake.  Renal artery Dopplers revealed 1- 59% stenosis bilaterally in 2019.  CT-A of the abdomen showed a 1.3 cm renal cyst but no adrenal adenomas.  There was no evidence of renal artery stenosis.  Erica [name] saw our pharmacist on 12/2019.  She was in a lot of pain at the time.  Given her high number or medications and her pain, her medications were not changed.  She has struggled with LE edema.  LE Doppler was negative for DVT 10/2019.  She was given metolazone to take prior to torsemide.  For the last two months she has been more swollen but it is finally better.  Her weight has decreased from 211 lb to 187 lb.  Erica [name] continues to struggle with shortness of breath. Her edema resolved but there was no improvement in her breathing.  She uses her CPAP every other night.  She notes that there is a recall on her CPAP and she isn't supposed to be using it.  However, she can't sleep well without it.  Lately her BP at home has been in the 150-200s.  She notes that when she has pain it is higher.  She has a lot of joint pain, especially in her feel.    Erica [name] wanted to reduce her medications.  However her blood pressure was significantly elevated.  We reduced hydralazine to 50 mg 3 times a day and increase doxazosin to 8 mg twice daily in an effort to try and get her off of her 3 times daily medications and improve compliance.  Ranexa was increased due to chest pain.  This seems to have helped her chest pain.  It seems to occur randomly both at rest and with exertion.  At her last appointment her blood pressure remained elevated.  Her  clonidine was increased to 0.6 mg every 24 hours.  Lately her blood pressures been in the 140s to 160s at home.  She wore ambulatory monitor that showed PVCs and up to 8 beats of SVT.  The episodes seem to occur mostly in the early a.m.  This happens when she is feeling short of breath.  Last week she had a lot of swelling.  She took an extra dose of torsemide.  It helped a little bit but the swelling recurred.  She also notes shortness of breath when walking.  She denies chest pain or pressure.   Past Medical History:  Diagnosis Date  . Allergy   . Anxiety   . Apnea, sleep 05/04/2014   AHI 31/hr now on CPAP at 12cm H2O  . Arthritis   . Asthma   . Asthma without status asthmaticus 01/19/2016  . Axillary hidradenitis suppurativa 08/01/2011  . Blood transfusion without reported diagnosis   . Breast cancer (HCC) 2010   a. locally advanced left breast carcinoma diagnosed in 2010 and treated with neoadjuvant chemotherapy with docetaxel and cyclophosphamide as well as paclitaxel. This was followed by radiation therapy which was completed in 2011.  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 CHF (congestive heart failure) (HCC)   . Chronic diastolic CHF (congestive heart failure) (HCC) 06/23/2013  . Chronic kidney disease   . Chronic pain in left foot 06/29/2018  . Cough variant asthma 09/27/2010   Followed in Pulmonary clinic/ [facility]/ [facility]  Onset reported in childhood  - PFT's 01/31/2011 FEV1  1.76 (60% ) ratio 68 and no better p B2,  DLCO 70% -med review 09/25/2012 > [place] drug assistance  paperwork given    - HFA 90% 11/20/2012 .  -Med calendar 12/07/12 , 04/06/2013 , 04/18/2014,, 08/30/2014 , 11/08/2014  - PFT's 01/12/2013  1.73 (65%)  Ratio 73 and no better p B2, DLCO 86% - trial off   . 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 Dyspnea on exertion    a. 01/2013 Lexi MV: EF 54%, no ischemia/infarct;  b. 05/2013 Echo: EF 60-65%, no rwma, Gr 2 DD;  b.   . Essential hypertension  10/09/2014   Estimated Creatinine Clearance: 82.4 mL/min (by C-G formula based on SCr of 0.98 mg/dL).  . Fatty liver   . Gastroparesis   . GERD (gastroesophageal reflux disease)   . Hyperglycemia 04/13/2011  . Hypertension   . Hypertensive heart disease 09/26/2015  . Hypertriglyceridemia without hypercholesterolemia 06/15/2015   Framingham risk score is 1%   . Idiopathic intracranial hypertension 10/21/2014  . Impaired glucose tolerance 04/13/2011  . Insomnia secondary to anxiety 04/05/2015  . Lymphadenitis, chronic    restricted LEFT extremity  . Malignant neoplasm of upper-outer quadrant of left  breast in female, estrogen receptor negative ([place]) 01/07/2013   Stage III, Invasive Ductal, s/p partial Left Mastectomy/Lumpectomy (baseball sized) with Lymph Node dissection, had Chemo and RadRx    . 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 OSA (obstructive sleep apnea) 05/04/2014   AHI 31/hr now on CPAP at 12cm H2O  . Oxygen deficiency    Uses 02 in CPAP  . Persistent proteinuria 03/31/2018  . Personal history of chemotherapy 2010  . Personal history of radiation therapy 2010  . Sleep apnea   . Tibialis posterior tendinitis, left 07/15/2018   Injected in March 10, 2019  . Upper airway cough syndrome 10/09/2014   Onset around 2012 with assoc pseudowheeze MRI 05/18/15 > Paranasal sinuses are clear - 11/17/2015 rec increase h1 to 2 hs to help with noct cough  - Allergy profile 11/17/2015 >  Eos 0.1 /  IgE  80 POS RAST  to grass, ragweed, cedar tree  - d/c acei 03/06/2018  - flare end of Jan 2020 in setting of ? Uri/ poorly controlled GERD - flare mid Oct 2020 with uri / assoc secondary gerd  . Vitamin D deficiency disease 03/31/2018    Past Surgical History:  Procedure Laterality Date  . BRAIN SURGERY     [place]  . BREAST LUMPECTOMY Left 09/2008  . CARPAL TUNNEL RELEASE Left 2011  . LYMPH NODE DISSECTION Left 2010   breast; 2 wks after breast lumpectomy  . RIGHT/LEFT HEART CATH AND CORONARY  ANGIOGRAPHY N/A 05/04/2019   Procedure: RIGHT/LEFT HEART CATH AND CORONARY ANGIOGRAPHY;  Surgeon: Jolaine Artist, MD;  Location: [facility];  Service: Cardiovascular;  Laterality: N/A;  Renal injection preformed   . TENDON RELEASE Right 2012   "hand"  . TUBAL LIGATION  05/11/2011   Procedure: POST PARTUM TUBAL LIGATION;  Surgeon: Emeterio Reeve, MD;  Location: [facility];  Service: Gynecology;  Laterality: Bilateral;  Induced for HTN    Current Medications: Current Meds  Medication Sig  . acetaminophen-codeine (TYLENOL #4) 300-60 MG tablet Take 1 tablet by mouth every 6 (six) hours as needed for pain. (Patient taking differently: Take 1 tablet by mouth every 6 (six) hours as needed (for cough).)  . acetaZOLAMIDE (DIAMOX) 250 MG tablet Take 4 tablets (1,000 mg total) by mouth 3 (three) times daily.  Marland Kitchen albuterol (PROVENTIL) (2.5 MG/3ML) 0.083% nebulizer solution USE 1 VIAL VIA NEBULIZER EVERY 4 HOURS AND AS NEEDED FOR WHEEZING OR SHORTNESS OF BREATH  . albuterol (VENTOLIN HFA) 108 (90 Base) MCG/ACT inhaler INHALE 2 PUFFS BY MOUTH EVERY 4 HOURS AS NEEDED FOR WHEEZE OR FOR SHORTNESS OF BREATH  . amitriptyline (ELAVIL) 100 MG tablet TAKE 1 TABLET BY MOUTH EVERYDAY AT BEDTIME  . amLODipine (NORVASC) 10 MG tablet Take 1 tablet (10 mg total) by mouth daily.  . Biotin 5000 MCG TABS Take 5,000 mg by mouth 2 (two) times daily.   . Cholecalciferol 1.25 MG (50000 UT) capsule Take 1 capsule (50,000 Units total) by mouth once a week.  . cloNIDine (CATAPRES - DOSED IN MG/24 HR) 0.3 mg/24hr patch PLACE 2 PATCHES ONTO SKIN ONCE WEEKLY  . clopidogrel (PLAVIX) 75 MG tablet Take 1 tablet (75 mg total) by mouth daily.  . clotrimazole (MYCELEX) 10 MG troche Take 1 tablet (10 mg total) by mouth 5 (five) times daily. (Patient taking differently: Take 10 mg by mouth as needed.)  . dexlansoprazole (DEXILANT) 60 MG capsule Take 1 capsule (60 mg total) by mouth daily.  Marland Kitchen dextromethorphan-guaiFENesin [facility]  DM) 30-600  MG 12hr tablet Take 2 tablets by mouth 2 (two) times daily.  Marland Kitchen doxazosin (CARDURA) 8 MG tablet Take 1 tablet (8 mg total) by mouth 2 (two) times daily.  . DULoxetine (CYMBALTA) 30 MG capsule TAKE 1 CAPSULE (30 MG TOTAL) BY MOUTH DAILY. WITH 60MG  FOR TOTAL OF 90 MG  . DULoxetine (CYMBALTA) 60 MG capsule TAKE 1 CAPSULE (60 MG TOTAL) BY MOUTH DAILY. FOR TOTAL OF 90 MG  . EPINEPHRINE 0.3 mg/0.3 mL IJ SOAJ injection INJECT 0.3 MLS INTO THE MUSCLE ONCE AS NEEDED FOR ANAPHYLAXIS  . ezetimibe (ZETIA) 10 MG tablet Take 1 tablet (10 mg total) by mouth daily.  . famotidine (PEPCID) 20 MG tablet Take 1-2 tablets (20-40 mg total) by mouth at bedtime.  . gabapentin (NEURONTIN) 300 MG capsule Take 1 capsule (300 mg total) by mouth 3 (three) times daily. One three times daily  . hydrALAZINE (APRESOLINE) 50 MG tablet Take 1 tablet (50 mg total) by mouth 3 (three) times daily.  . hydrochlorothiazide (HYDRODIURIL) 25 MG tablet Take 1 tablet (25 mg total) by mouth daily.  Marland Kitchen lidocaine-prilocaine (EMLA) cream Apply 1 application topically every 28 (twenty-eight) days. Apply 1 application to skin ever 28 days  . metoCLOPramide (REGLAN) 10 MG tablet Take 1 tablet (10 mg total) by mouth 4 (four) times daily.  . minoxidil (LONITEN) 2.5 MG tablet Take 2 tablets (5 mg total) by mouth 2 (two) times daily.  . montelukast (SINGULAIR) 10 MG tablet TAKE 1 TABLET BY MOUTH EVERYDAY AT BEDTIME  . Nebivolol HCl 20 MG TABS Take 20 mg by mouth as directed. TAKE 2 TABLETS AT BEDTIME  . NITROSTAT 0.4 MG SL tablet PLACE 1 TABLET UNDER TONGUE EVERY 5 MINUTES AS NEEDED FOR CHEST PAIN, MAX OF 3 TABLETS THEN CALL 911 IF PAIN PERSISTS  . oxymetazoline (MUCINEX SINUS-MAX FULL FORCE) 0.05 % nasal spray Place 1 spray into both nostrils 2 (two) times daily.  . Potassium Chloride ER 20 MEQ TBCR TAKE 1 TABLET BY MOUTH TWICE A DAY  . primidone (MYSOLINE) 50 MG tablet Take 0.5 tablets (25 mg total) by mouth at bedtime.  . promethazine (PHENERGAN) 25  MG suppository Place 25 mg rectally every 6 (six) hours as needed for nausea or vomiting.   . promethazine (PHENERGAN) 25 MG tablet Take 1 tablet (25 mg total) by mouth 3 (three) times daily as needed for nausea or vomiting.  . promethazine-dextromethorphan (PROMETHAZINE-DM) 6.25-15 MG/5ML syrup Take 5 mLs by mouth 4 (four) times daily as needed for cough.  . ranolazine (RANEXA) 1000 MG SR tablet Take 1 tablet (1,000 mg total) by mouth 2 (two) times daily.  Marland Kitchen Respiratory Therapy Supplies (FLUTTER) DEVI Use as directed  . rosuvastatin (CRESTOR) 40 MG tablet Take 1 tablet (40 mg total) by mouth daily.  . sacubitril-valsartan (ENTRESTO) 97-103 MG Take 1 tablet by mouth 2 (two) times daily.  Marland Kitchen Spacer/Aero-Holding Chambers (AEROCHAMBER MV) inhaler Use as instructed  . spironolactone (ALDACTONE) 100 MG tablet TAKE 1 TABLET BY MOUTH EVERY DAY  . SYMBICORT 160-4.5 MCG/ACT inhaler INHALE 2 PUFFS INTO LUNGS 2 TIMES DAILY AS DIRECTED (Patient taking differently: Inhale 2 puffs into the lungs 2 (two) times daily.)  . torsemide (DEMADEX) 20 MG tablet Take 1 tablet (20 mg total) by mouth 2 (two) times daily.  . [DISCONTINUED] Nebivolol HCl (BYSTOLIC) 20 MG TABS Take 2 tablets (40 mg total) by mouth daily. (Patient taking differently: Take 2 tablets by mouth at bedtime.)  . [DISCONTINUED] Olmesartan-amLODIPine-HCTZ 40-10-25  MG TABS Take 1 tablet by mouth daily.     Allergies:   Aspirin, Doxycycline, Kiwi extract, Metoprolol, Penicillins, Strawberry extract, and Sulfamethoxazole-trimethoprim   Social History   Socioeconomic History  . Marital status: Single    Spouse name: Not on file  . Number of children: 3  . Years of education: Not on file  . Highest education level: Not on file  Occupational History    Employer: UNEMPLOYED  Tobacco Use  . Smoking status: Never Smoker  . Smokeless tobacco: Never Used  Vaping Use  . Vaping Use: Never used  Substance and Sexual Activity  . Alcohol use: Yes     Alcohol/week: 0.0 standard drinks    Comment: occasionally/socially  . Drug use: No  . Sexual activity: Yes    Birth control/protection: Surgical  Other Topics Concern  . Not on file  Social History Narrative   She lives with four children.   She is currently not working (disbaility pending).   Left handed   One story home   Drinks caffeine      Social Determinants of Health   Financial Resource Strain: Medium Risk  . Difficulty of Paying Living Expenses: Somewhat hard  Food Insecurity: Food Insecurity Present  . Worried About Charity fundraiser in the Last Year: Often true  . Ran Out of Food in the Last Year: Often true  Transportation Needs: No Transportation Needs  . Lack of Transportation (Medical): No  . Lack of Transportation (Non-Medical): No  Physical Activity: Not on file  Stress: Not on file  Social Connections: Not on file     Family History: The patient's family history includes Breast cancer in some other family members; CAD in her maternal grandmother; Diabetes in her maternal grandmother; Fibroids (age of onset: 78) in her sister; Heart disease in her maternal grandmother; Hypertension in her maternal grandmother; Other (age of onset: 70) in her mother. There is no history of Cancer, Alcohol abuse, Early death, Hyperlipidemia, Kidney disease, Stroke, Colon cancer, Esophageal cancer, Stomach cancer, or Rectal cancer.  ROS:   Please see the history of present illness.     All other systems reviewed and are negative.  EKGs/Labs/Other Studies Reviewed:    EKG:  EKG is not ordered today.  The ekg ordered 04/23/19 demonstrates sinus tachycardia.  Rate 124 bpm.  LVH  Recent Labs: 04/30/2019: NT-Pro BNP 346; TSH 1.470 02/28/2020: ALT 23; Hemoglobin 13.5; Platelets 247 04/10/2020: BUN 15; Creatinine, Ser 1.07; Magnesium 2.0; Potassium 3.6; Sodium 143   Recent Lipid Panel    Component Value Date/Time   CHOL 200 (H) 02/28/2020 0841   TRIG 167 (H) 02/28/2020 0841    HDL 40 02/28/2020 0841   CHOLHDL 5.0 (H) 02/28/2020 0841   CHOLHDL 4 10/01/2018 1115   VLDL 22.6 10/01/2018 1115   LDLCALC 130 (H) 02/28/2020 0841   LDLDIRECT 152.0 03/30/2018 1604    Echo 03/12/18: Study Conclusions  - Left ventricle: The cavity size was normal. Wall thickness was  increased in a pattern of moderate LVH. Systolic function was  mildly reduced. The estimated ejection fraction was in the range  of 45% to 50%. Wall motion was normal; there were no regional  wall motion abnormalities. Features are consistent with a  pseudonormal left ventricular filling pattern, with concomitant  abnormal relaxation and increased filling pressure (grade 2  diastolic dysfunction).  - Mitral valve: There was mild regurgitation.  - Pulmonary arteries: Systolic pressure was mildly increased. PA  peak pressure: 33  mm Hg (S).   Lexiscan Myoview 04/20/18:   The left ventricular ejection fraction is moderately decreased (30-44%).  Nuclear stress EF: 41%.  There was no ST segment deviation noted during stress.  This is an intermediate risk study.   Abnormal, intermediate risk stress nuclear study with no ischemia or infarction; EF 41 with global hypokinesis and mild LVE; study intermediate risk due to reduced LV function.  Renal artery Doppler 10/29/18: Right: 1-59% stenosis of the right renal artery. Cyst(s) noted.  Left: 1-59% stenosis of the left renal artery.    LHC/RHC 05/04/19:  Dist LAD-1 lesion is 30% stenosed.  Dist LAD-2 lesion is 80% stenosed.  Dist Cx lesion is 60% stenosed.   Findings:  Ao = 173/104 (134) LV =  181/25 RA =  10 RV =  41/13 PA =  42/10 (26) PCW = 18 Fick cardiac output/index = 5.8/2.9 PVR = 1.4 wu Ao sat = 98% PA sat = 73%, 73%  Assessment: 1. 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 LVEF 55-60% 3. Mildly elevated filling pressures with mild pulmonary venous HTN m4. Widely patent bilateral renal arteries. No evidence of  AAA 5. Severe systemic HTN    Physical Exam:    Wt Readings from Last 3 Encounters:  04/10/20 206 lb (93.4 kg)  03/29/20 200 lb 3.2 oz (90.8 kg)  02/28/20 195 lb (88.5 kg)    VS:  BP (!) 148/80   Pulse 88   Ht 5\' 6"  (1.676 m)   Wt 206 lb (93.4 kg)   SpO2 94%   BMI 33.25 kg/m  , BMI Body mass index is 33.25 kg/m. GENERAL:  Well appearing HEENT: Pupils equal round and reactive, fundi not visualized, oral mucosa unremarkable NECK:  No jugular venous distention, waveform within normal limits, carotid upstroke brisk and symmetric, no bruits LUNGS:  Clear to auscultation bilaterally HEART:  RRR.  PMI not displaced or sustained,S1 and S2 within normal limits, no S3, no S4, no clicks, no rubs, II/VI systolic murmur at the LUSB ABD:  Abdominal bloating.  positive bowel sounds normal in frequency in pitch, no bruits, no rebound, no guarding, no midline pulsatile mass, no hepatomegaly, no splenomegaly EXT:  2 plus pulses throughout, no edema, no cyanosis no clubbing SKIN:  No rashes no nodules NEURO:  Cranial nerves II through XII grossly intact, motor grossly intact throughout PSYCH:  Cognitively intact, oriented to person place and time   ASSESSMENT:    1. Essential hypertension   2. Malignant hypertension   3. Obstructive sleep apnea syndrome   4. DOE (dyspnea on exertion)   5. Hyperlipidemia LDL goal <100     PLAN:    # Resistant hypertension:  # Chronic systolic and diastolic heart failure: Erica [name] BP is elevated but improving.  Continue clonidine patch to 0.6 mg every 24 hours, nebivolol, doxazosin, hydralazine, minoxidil, and spironolactone.  Stop olmesartan and start Entresto 97/103 mg bid.  She will start amlodipine 10mg  and HCTZ 25 mg separately.  Monitor renal function with BMP in one week.  Check a basic metabolic panel and magnesium today.  # OSA:  Continue BiPAP.   # CAD:  # Hyperlipidemia: She previously reported exertional chest pain and has 80% distal LAD  disease.  She doesn't tolerate Imdur 2/2 headaches. It seems to have improved with higher doses of Ranexa.  Continue clopidogrel, ranolazine, Zetia, and nebivolol.   # SVT: # PVCs: Continue nebivolol.  Consider CCB.  Check BMP and magnesium today.  Disposition:  F/u in 2 months     Medication Adjustments/Labs and Tests Ordered: Current medicines are reviewed at length with the patient today.  Concerns regarding medicines are outlined above.  Orders Placed This Encounter  Procedures  . Basic metabolic panel  . Magnesium   Meds ordered this encounter  Medications  . amLODipine (NORVASC) 10 MG tablet    Sig: Take 1 tablet (10 mg total) by mouth daily.    Dispense:  180 tablet    Refill:  3    D/C COMBO  . hydrochlorothiazide (HYDRODIURIL) 25 MG tablet    Sig: Take 1 tablet (25 mg total) by mouth daily.    Dispense:  90 tablet    Refill:  3    D/C COMBO  . sacubitril-valsartan (ENTRESTO) 97-103 MG    Sig: Take 1 tablet by mouth 2 (two) times daily.    Dispense:  60 tablet    Refill:  5     Signed, Skeet Latch, MD  04/10/2020 9:45 PM    [facility]

## 2020-04-11 ENCOUNTER — Telehealth: Payer: Self-pay | Admitting: Licensed Clinical Social Worker

## 2020-04-11 NOTE — Progress Notes (Signed)
Reached out to [facility], case worker w/ [employer]. Was able to confirm that pt was approved after referral in December for [facility] for her and her family. Updated SDOH wheel.   Westley Hummer, MSW, [place]  [phone]

## 2020-04-14 ENCOUNTER — Telehealth: Payer: Self-pay

## 2020-04-14 NOTE — Telephone Encounter (Signed)
Call to pt reference change of start date for PREP to 05/08/20 M/W 10am Agreeable to starting then

## 2020-04-27 MED ORDER — BUDESONIDE-FORMOTEROL FUMARATE 160-4.5 MCG/ACT IN AERO: INHALATION_SPRAY | RESPIRATORY_TRACT | 11 refills | Status: DC

## 2020-05-03 ENCOUNTER — Telehealth: Payer: Self-pay

## 2020-05-03 NOTE — Telephone Encounter (Signed)
Call to pt to confirm start of PREP class on 05/08/20 M/W 10am-1115am at [facility] to starting then.

## 2020-05-04 ENCOUNTER — Other Ambulatory Visit: Payer: Self-pay

## 2020-05-04 ENCOUNTER — Inpatient Hospital Stay: Payer: Medicare Other | Attending: Oncology

## 2020-05-04 VITALS — BP 129/72 | HR 80 | Temp 98.9°F | Resp 18

## 2020-05-04 DIAGNOSIS — C50412 Malignant neoplasm of upper-outer quadrant of left female breast: Secondary | ICD-10-CM

## 2020-05-04 DIAGNOSIS — Z79818 Long term (current) use of other agents affecting estrogen receptors and estrogen levels: Secondary | ICD-10-CM | POA: Insufficient documentation

## 2020-05-04 MED ORDER — GOSERELIN ACETATE 3.6 MG ~~LOC~~ IMPL
DRUG_IMPLANT | SUBCUTANEOUS | Status: AC
  Filled 2020-05-04: qty 3.6

## 2020-05-04 MED ORDER — GOSERELIN ACETATE 3.6 MG ~~LOC~~ IMPL
3.6000 mg | DRUG_IMPLANT | Freq: Once | SUBCUTANEOUS | Status: AC
  Administered 2020-05-04: 3.6 mg via SUBCUTANEOUS

## 2020-05-04 NOTE — Patient Instructions (Signed)

## 2020-05-08 ENCOUNTER — Telehealth: Payer: Self-pay | Admitting: *Deleted

## 2020-05-08 NOTE — Telephone Encounter (Signed)
Dr. Meda Coffee,  This patient has nonobstructive distal CAD (no PCI) and is on plavix due to ASA allergy. May she old plavix 5-7 days prior to colonoscopy?

## 2020-05-08 NOTE — Telephone Encounter (Signed)
Request for surgical clearance:     Endoscopy Procedure  What type of surgery is being performed?     colonoscopy  When is this surgery scheduled?    07/04/20  What type of clearance is required ?   Pharmacy  Are there any medications that need to be held prior to surgery and how long? Plavix, 5 days  Practice name and name of physician performing surgery?      [facility]  What is your office phone and fax number?      Phone- [phone]  [phone]  Anesthesia type (None, local, MAC, general) ?       MAC

## 2020-05-09 ENCOUNTER — Telehealth (INDEPENDENT_AMBULATORY_CARE_PROVIDER_SITE_OTHER): Payer: Medicare Other | Admitting: Cardiology

## 2020-05-09 ENCOUNTER — Encounter: Payer: Self-pay | Admitting: Cardiology

## 2020-05-09 ENCOUNTER — Telehealth: Payer: Self-pay | Admitting: *Deleted

## 2020-05-09 ENCOUNTER — Other Ambulatory Visit: Payer: Self-pay

## 2020-05-09 VITALS — BP 140/89 | HR 99 | Ht 66.0 in | Wt 202.0 lb

## 2020-05-09 DIAGNOSIS — G4733 Obstructive sleep apnea (adult) (pediatric): Secondary | ICD-10-CM

## 2020-05-09 DIAGNOSIS — I10 Essential (primary) hypertension: Secondary | ICD-10-CM

## 2020-05-09 DIAGNOSIS — G4719 Other hypersomnia: Secondary | ICD-10-CM | POA: Diagnosis not present

## 2020-05-09 DIAGNOSIS — E669 Obesity, unspecified: Secondary | ICD-10-CM

## 2020-05-09 NOTE — Telephone Encounter (Signed)
Staff message sent to Gae Bon ok to schedule MLST. Per [facility] web portal no PA is required.  Decision ID # [id number].

## 2020-05-09 NOTE — Addendum Note (Signed)
Addended by: Antonieta Iba on: 05/09/2020 10:47 AM   Modules accepted: Orders

## 2020-05-09 NOTE — Telephone Encounter (Signed)
   Primary Cardiologist: Ena Dawley, MD  Chart reviewed as part of pre-operative protocol coverage. We were asked for guidance to hold plavix for colonoscopy.   Per Dr. Meda Coffee: OK to hold plavix 5-7 days prior to procedure, restart when safe to do so after.   I will route this recommendation to the requesting party via Epic fax function and remove from pre-op pool. Please call with questions.  Tami Lin Jackalyn Haith, PA 05/09/2020, 8:48 AM

## 2020-05-09 NOTE — Patient Instructions (Signed)
Medication Instructions:  °Your physician recommends that you continue on your current medications as directed. Please refer to the Current Medication list given to you today. ° °*If you need a refill on your cardiac medications before your next appointment, please call your pharmacy* ° °Testing/Procedures: °Your physician has recommended that you have a sleep study. This test records several body functions during sleep, including: brain activity, eye movement, oxygen and carbon dioxide blood levels, heart rate and rhythm, breathing rate and rhythm, the flow of air through your mouth and nose, snoring, body muscle movements, and chest and belly movement. ° °Follow-Up: °At [facility], you and your health needs are our priority.  As part of our continuing mission to provide you with exceptional heart care, we have created designated Provider Care Teams.  These Care Teams include your primary Cardiologist (physician) and Advanced Practice Providers (APPs -  Physician Assistants and Nurse Practitioners) who all work together to provide you with the care you need, when you need it. ° °Your next appointment:   °1 year(s) ° °The format for your next appointment:   °In Person ° °Provider:   °[name] Turner, MD ° ° °

## 2020-05-09 NOTE — Telephone Encounter (Signed)
MSLT sent to sleep pool for precert.

## 2020-05-09 NOTE — Telephone Encounter (Signed)
Yes, it's ok.

## 2020-05-09 NOTE — Telephone Encounter (Signed)
Patient has been advised that per cardiology, she may hold her plavix 5 days prior to her upcoming colonoscopy on 07/04/20. I advised that she will be given additional instruction when she comes for her previsit on 06/19/20. Patient verbalizes understanding of all of the above information.

## 2020-05-09 NOTE — Progress Notes (Signed)
Virtual Visit via Video Note   This visit type was conducted due to national recommendations for restrictions regarding the COVID-19 Pandemic (e.g. social distancing) in an effort to limit this patient's exposure and mitigate transmission in our community.  Due to her co-morbid illnesses, this patient is at least at moderate risk for complications without adequate follow up.  This format is felt to be most appropriate for this patient at this time.  The patient did not have access to video technology/had technical difficulties with video requiring transitioning to audio format only (telephone).  All issues noted in this document were discussed and addressed.  No physical exam could be performed with this format.  Please refer to the patient's chart for her  consent to telehealth for [facility].   Evaluation Performed:  Follow-up visit  This visit type was conducted due to national recommendations for restrictions regarding the COVID-19 Pandemic (e.g. social distancing).  This format is felt to be most appropriate for this patient at this time.  All issues noted in this document were discussed and addressed.  No physical exam was performed (except for noted visual exam findings with Video Visits).  Please refer to the patient's chart (MyChart message for video visits and phone note for telephone visits) for the patient's consent to telehealth for [facility].  Date:  05/09/2020   ID:  Erica [name], DOB 12-Aug-1975, MRN [id number]  Patient Location:  Home  Provider location:   [place]  PCP:  [name], MD  Cardiologist:  Ena Dawley, MD  Sleep Medicine:  Fransico Him, MD Electrophysiologist:  None   Chief Complaint:  OSA  History of Present Illness:    Erica [name] is a 45 y.o. female who presents via audio/video conferencing for a telehealth visit today.    Erica [name] is a 45 y.o. female with a hx of severe OSA with an AHI of 31/hr mainly in NREM supine sleep  with successful CPAP titration to 12cm H2O in 2016.  She had issues on CPAP complaining that she was not getting enough air and waking up with a lot of mucus which was resulting in noncompliance with her device.  She was sent back to the sleep lab for a BiPAP titration which she did not have until a year later but was titrated to 17/13cm H2O.    She is here today for followup.  She tells me that she had been sick for over 3 months with severe joint pain and had gained 30lbs of fluid which has now resolved.  She is doing well with her CPAP device and thinks that she has gotten used to it.  She tolerates the full face mask but occasionally has some leakage.  She changes out the cushion 2 times monthly.  For the most part she feels the pressure is adequate.  Since going on CPAP she feels more rested in the am for the most part and occasionally has to nap during the day.  She will easily fall asleep if she sits down and cannot control staying awake.  She has fallen out of the chair in the past after falling asleep at the kitchen table.  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  She has very bad mouth dryness and has adjusted her humidity but still has dryness.  She does not think that he snores.     The patient does not have symptoms concerning for COVID-19 infection (fever, chills,  cough, or new shortness of breath).   Prior CV studies:   The following studies were reviewed today:  None  Past Medical History:  Diagnosis Date  . Allergy   . Anxiety   . Apnea, sleep 05/04/2014   AHI 31/hr now on CPAP at 12cm H2O  . Arthritis   . Asthma   . Asthma without status asthmaticus 01/19/2016  . Axillary hidradenitis suppurativa 08/01/2011  . Blood transfusion without reported diagnosis   . Breast cancer ([place]) 2010   a. locally advanced left breast carcinoma diagnosed in 2010 and treated with neoadjuvant chemotherapy with docetaxel and cyclophosphamide as well as paclitaxel. This was  followed by radiation therapy which was completed in 2011.  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 CHF (congestive heart failure) ([place])   . Chronic diastolic CHF (congestive heart failure) ([place]) 06/23/2013  . Chronic kidney disease   . Chronic pain in left foot 06/29/2018  . Cough variant asthma 09/27/2010   Followed in Pulmonary clinic/ [facility]/ [place]  Onset reported in childhood  - PFT's 01/31/2011 FEV1  1.76 (60% ) ratio 68 and no better p B2,  DLCO 70% -med review 09/25/2012 > [place] drug assistance  paperwork given    - HFA 90% 11/20/2012 .  -Med calendar 12/07/12 , 04/06/2013 , 04/18/2014,, 08/30/2014 , 11/08/2014  - PFT's 01/12/2013  1.73 (65%)  Ratio 73 and no better p B2, DLCO 86% - trial off   . 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 Dyspnea on exertion    a. 01/2013 Lexi MV: EF 54%, no ischemia/infarct;  b. 05/2013 Echo: EF 60-65%, no rwma, Gr 2 DD;  b.   . Essential hypertension 10/09/2014   Estimated Creatinine Clearance: 82.4 mL/min (by C-G formula based on SCr of 0.98 mg/dL).  . Fatty liver   . Gastroparesis   . GERD (gastroesophageal reflux disease)   . Hyperglycemia 04/13/2011  . Hypertension   . Hypertensive heart disease 09/26/2015  . Hypertriglyceridemia without hypercholesterolemia 06/15/2015   Framingham risk score is 1%   . Idiopathic intracranial hypertension 10/21/2014  . Impaired glucose tolerance 04/13/2011  . Insomnia secondary to anxiety 04/05/2015  . Lymphadenitis, chronic    restricted LEFT extremity  . Malignant neoplasm of upper-outer quadrant of left breast in female, estrogen receptor negative ([place]) 01/07/2013   Stage III, Invasive Ductal, s/p partial Left Mastectomy/Lumpectomy (baseball sized) with Lymph Node dissection, had Chemo and RadRx    . 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 OSA (obstructive sleep apnea) 05/04/2014   AHI 31/hr now on CPAP at 12cm H2O  . Oxygen deficiency    Uses 02 in CPAP  . Persistent proteinuria  03/31/2018  . Personal history of chemotherapy 2010  . Personal history of radiation therapy 2010  . Sleep apnea   . Tibialis posterior tendinitis, left 07/15/2018   Injected in March 10, 2019  . Upper airway cough syndrome 10/09/2014   Onset around 2012 with assoc pseudowheeze MRI 05/18/15 > Paranasal sinuses are clear - 11/17/2015 rec increase h1 to 2 hs to help with noct cough  - Allergy profile 11/17/2015 >  Eos 0.1 /  IgE  80 POS RAST  to grass, ragweed, cedar tree  - d/c acei 03/06/2018  - flare end of Jan 2020 in setting of ? Uri/ poorly controlled GERD - flare mid Oct 2020 with uri / assoc secondary gerd  . Vitamin D deficiency disease 03/31/2018   Past Surgical History:  Procedure Laterality Date  .  BRAIN SURGERY     [place]  . BREAST LUMPECTOMY Left 09/2008  . CARPAL TUNNEL RELEASE Left 2011  . LYMPH NODE DISSECTION Left 2010   breast; 2 wks after breast lumpectomy  . RIGHT/LEFT HEART CATH AND CORONARY ANGIOGRAPHY N/A 05/04/2019   Procedure: RIGHT/LEFT HEART CATH AND CORONARY ANGIOGRAPHY;  Surgeon: Jolaine Artist, MD;  Location: [place] [facility];  Service: Cardiovascular;  Laterality: N/A;  Renal injection preformed   . TENDON RELEASE Right 2012   "hand"  . TUBAL LIGATION  05/11/2011   Procedure: POST PARTUM TUBAL LIGATION;  Surgeon: Emeterio Reeve, MD;  Location: [facility];  Service: Gynecology;  Laterality: Bilateral;  Induced for HTN     Current Meds  Medication Sig  . acetaminophen-codeine (TYLENOL #4) 300-60 MG tablet Take 1 tablet by mouth every 6 (six) hours as needed for pain. (Patient taking differently: Take 1 tablet by mouth every 6 (six) hours as needed (for cough).)  . acetaZOLAMIDE (DIAMOX) 250 MG tablet Take 4 tablets (1,000 mg total) by mouth 3 (three) times daily.  Marland Kitchen albuterol (PROVENTIL) (2.5 MG/3ML) 0.083% nebulizer solution USE 1 VIAL VIA NEBULIZER EVERY 4 HOURS AND AS NEEDED FOR WHEEZING OR SHORTNESS OF BREATH  . albuterol (VENTOLIN HFA) 108 (90 Base) MCG/ACT  inhaler INHALE 2 PUFFS BY MOUTH EVERY 4 HOURS AS NEEDED FOR WHEEZE OR FOR SHORTNESS OF BREATH  . amitriptyline (ELAVIL) 100 MG tablet TAKE 1 TABLET BY MOUTH EVERYDAY AT BEDTIME  . amLODipine (NORVASC) 10 MG tablet Take 1 tablet (10 mg total) by mouth daily.  . Biotin 5000 MCG TABS Take 5,000 mg by mouth 2 (two) times daily.   . budesonide-formoterol (SYMBICORT) 160-4.5 MCG/ACT inhaler INHALE 2 PUFFS INTO LUNGS 2 TIMES DAILY AS DIRECTED  . Cholecalciferol 1.25 MG (50000 UT) capsule Take 1 capsule (50,000 Units total) by mouth once a week.  . cloNIDine (CATAPRES - DOSED IN MG/24 HR) 0.3 mg/24hr patch PLACE 2 PATCHES ONTO SKIN ONCE WEEKLY  . clopidogrel (PLAVIX) 75 MG tablet Take 1 tablet (75 mg total) by mouth daily.  . clotrimazole (MYCELEX) 10 MG troche Take 1 tablet (10 mg total) by mouth 5 (five) times daily. (Patient taking differently: Take 10 mg by mouth as needed.)  . dexlansoprazole (DEXILANT) 60 MG capsule Take 1 capsule (60 mg total) by mouth daily.  Marland Kitchen dextromethorphan-guaiFENesin (MUCINEX DM) 30-600 MG 12hr tablet Take 2 tablets by mouth 2 (two) times daily.  Marland Kitchen doxazosin (CARDURA) 8 MG tablet Take 1 tablet (8 mg total) by mouth 2 (two) times daily.  . DULoxetine (CYMBALTA) 30 MG capsule TAKE 1 CAPSULE (30 MG TOTAL) BY MOUTH DAILY. WITH 60MG  FOR TOTAL OF 90 MG  . DULoxetine (CYMBALTA) 60 MG capsule TAKE 1 CAPSULE (60 MG TOTAL) BY MOUTH DAILY. FOR TOTAL OF 90 MG  . EPINEPHRINE 0.3 mg/0.3 mL IJ SOAJ injection INJECT 0.3 MLS INTO THE MUSCLE ONCE AS NEEDED FOR ANAPHYLAXIS  . ezetimibe (ZETIA) 10 MG tablet Take 1 tablet (10 mg total) by mouth daily.  . famotidine (PEPCID) 20 MG tablet Take 1-2 tablets (20-40 mg total) by mouth at bedtime.  . gabapentin (NEURONTIN) 300 MG capsule Take 1 capsule (300 mg total) by mouth 3 (three) times daily. One three times daily  . hydrALAZINE (APRESOLINE) 50 MG tablet Take 1 tablet (50 mg total) by mouth 3 (three) times daily.  . hydrochlorothiazide  (HYDRODIURIL) 25 MG tablet Take 1 tablet (25 mg total) by mouth daily.  Marland Kitchen lidocaine-prilocaine (EMLA) cream Apply 1  application topically every 28 (twenty-eight) days. Apply 1 application to skin ever 28 days  . metoCLOPramide (REGLAN) 10 MG tablet Take 1 tablet (10 mg total) by mouth 4 (four) times daily.  . minoxidil (LONITEN) 2.5 MG tablet Take 2 tablets (5 mg total) by mouth 2 (two) times daily.  . montelukast (SINGULAIR) 10 MG tablet TAKE 1 TABLET BY MOUTH EVERYDAY AT BEDTIME  . Nebivolol HCl 20 MG TABS Take 20 mg by mouth as directed. TAKE 2 TABLETS AT BEDTIME  . NITROSTAT 0.4 MG SL tablet PLACE 1 TABLET UNDER TONGUE EVERY 5 MINUTES AS NEEDED FOR CHEST PAIN, MAX OF 3 TABLETS THEN CALL 911 IF PAIN PERSISTS  . oxymetazoline (MUCINEX SINUS-MAX FULL FORCE) 0.05 % nasal spray Place 1 spray into both nostrils 2 (two) times daily.  . Potassium Chloride ER 20 MEQ TBCR TAKE 1 TABLET BY MOUTH TWICE A DAY  . primidone (MYSOLINE) 50 MG tablet Take 0.5 tablets (25 mg total) by mouth at bedtime.  . promethazine (PHENERGAN) 25 MG suppository Place 25 mg rectally every 6 (six) hours as needed for nausea or vomiting.   . promethazine (PHENERGAN) 25 MG tablet Take 1 tablet (25 mg total) by mouth 3 (three) times daily as needed for nausea or vomiting.  . promethazine-dextromethorphan (PROMETHAZINE-DM) 6.25-15 MG/5ML syrup Take 5 mLs by mouth 4 (four) times daily as needed for cough.  . ranolazine (RANEXA) 1000 MG SR tablet Take 1 tablet (1,000 mg total) by mouth 2 (two) times daily.  Marland Kitchen Respiratory Therapy Supplies (FLUTTER) DEVI Use as directed  . rosuvastatin (CRESTOR) 40 MG tablet Take 1 tablet (40 mg total) by mouth daily.  . sacubitril-valsartan (ENTRESTO) 97-103 MG Take 1 tablet by mouth 2 (two) times daily.  Marland Kitchen Spacer/Aero-Holding Chambers (AEROCHAMBER MV) inhaler Use as instructed  . spironolactone (ALDACTONE) 100 MG tablet TAKE 1 TABLET BY MOUTH EVERY DAY  . torsemide (DEMADEX) 20 MG tablet Take 1  tablet (20 mg total) by mouth 2 (two) times daily.     Allergies:   Aspirin, Doxycycline, Kiwi extract, Metoprolol, Penicillins, Strawberry extract, and Sulfamethoxazole-trimethoprim   Social History   Tobacco Use  . Smoking status: Never Smoker  . Smokeless tobacco: Never Used  Vaping Use  . Vaping Use: Never used  Substance Use Topics  . Alcohol use: Yes    Alcohol/week: 0.0 standard drinks    Comment: occasionally/socially  . Drug use: No     Family Hx: The patient's family history includes Breast cancer in some other family members; CAD in her maternal grandmother; Diabetes in her maternal grandmother; Fibroids (age of onset: 52) in her sister; Heart disease in her maternal grandmother; Hypertension in her maternal grandmother; Other (age of onset: 24) in her mother. There is no history of Cancer, Alcohol abuse, Early death, Hyperlipidemia, Kidney disease, Stroke, Colon cancer, Esophageal cancer, Stomach cancer, or Rectal cancer.  ROS:   Please see the history of present illness.     All other systems reviewed and are negative.   Labs/Other Tests and Data Reviewed:    Recent Labs: 02/28/2020: ALT 23; Hemoglobin 13.5; Platelets 247 04/10/2020: BUN 15; Creatinine, Ser 1.07; Magnesium 2.0; Potassium 3.6; Sodium 143   Recent Lipid Panel Lab Results  Component Value Date/Time   CHOL 200 (H) 02/28/2020 08:41 AM   TRIG 167 (H) 02/28/2020 08:41 AM   HDL 40 02/28/2020 08:41 AM   CHOLHDL 5.0 (H) 02/28/2020 08:41 AM   CHOLHDL 4 10/01/2018 11:15 AM   LDLCALC 130 (H) 02/28/2020  08:41 AM   LDLDIRECT 152.0 03/30/2018 04:04 PM    Wt Readings from Last 3 Encounters:  05/09/20 202 lb (91.6 kg)  04/10/20 206 lb (93.4 kg)  03/29/20 200 lb 3.2 oz (90.8 kg)     Objective:    Vital Signs:  BP 140/89   Pulse 99   Ht 5\' 6"  (1.676 m)   Wt 202 lb (91.6 kg)   BMI 32.60 kg/m    Well nourished, well developed female in no acute distress. Well appearing, alert and conversant,  regular work of breathing,  good skin color  Eyes- anicteric mouth- oral mucosa is pink  neuro- grossly intact skin- no apparent rash or lesions or cyanosis   ASSESSMENT & PLAN:    1. OSA -  The patient is tolerating PAP therapy well without any problems.  The patient has been using and benefiting from PAP use and will continue to benefit from therapy. I will get a download from her DME.    2.  HTN -BP controlled on exam today -continue amlodipine 10mg  daily, Clonidine patch, Doxazosin 8mg  daily, Hydralazine 100mg  TID and Bystolic 20mg  daily -she has not taken her meds yet  3.  Obesity -I have encouraged her to get into a routine exercise program and cut back on carbs and portions.   4.  Possible Narcolepsy/Ecessive daytime sleepiness -she has no drop attacks but cannot control falling asleep some during the day and had an episode where she fell asleep at the table and fell off the chair -I will order an MSLT   COVID-19 Education: The signs and symptoms of COVID-19 were discussed with the patient and how to seek care for testing (follow up with PCP or arrange E-visit).  The importance of social distancing was discussed today.  Patient Risk:   After full review of this patient's clinical status, I feel that they are at least moderate risk at this time.  Time:   Today, I have spent 20 minutes on telemedicine discussing medical problems including OSA, HTN, obesity and reviewing patient's chart including PAP compliance download.  Medication Adjustments/Labs and Tests Ordered: Current medicines are reviewed at length with the patient today.  Concerns regarding medicines are outlined above.  Tests Ordered: No orders of the defined types were placed in this encounter.  Medication Changes: No orders of the defined types were placed in this encounter.   Disposition:  Follow up in 6 months  Signed, Fransico Him, MD  05/09/2020 10:28 AM    [place]

## 2020-05-09 NOTE — Telephone Encounter (Signed)
-----   Message from Antonieta Iba, RN sent at 05/09/2020 10:48 AM EST ----- See message below from Dr. Radford Pax. Sleep latency test has been ordered.   "Please order a multiple sleep latency test - this is done in conjunction with a BIPAP titration the night before the MSLt through the sleep lab - you can forward this to Gae Bon to scheduled.  1 year followup"   Thanks!

## 2020-05-12 NOTE — Addendum Note (Signed)
Addended by: Freada Bergeron on: 05/12/2020 03:12 PM   Modules accepted: Orders

## 2020-05-15 NOTE — Progress Notes (Signed)
[facility] PREP Weekly Session   Patient Details  Name: Erica [name] MRN: [id number] Date of Birth: 03-Dec-1975 Age: 45 y.o. PCP: [name], MD  Vitals:   05/15/20 1637  Weight: 204 lb (92.5 kg)     [facility] [employer] Weekly seesion - 05/15/20 1600      Weekly Session   Topic Discussed Importance of resistance training;Other ways to be active    Minutes exercised this week --   not noted   Classes attended to date 2          Weekly progress report reviewed   Barnett Hatter 05/15/2020, 4:38 PM

## 2020-05-16 NOTE — Telephone Encounter (Signed)
RE: precert Lauralee Evener, CMA  Freada Bergeron, CMA Ok to schedule. Per [employer] no PA is required.    From: Freada Bergeron, CMA  Sent: 05/09/2020 12:15 PM EST  To: [facility] Sleep Studies  Subject: precert                      ( MSLT)  multiple sleep latency test - this is done in conjunction with a BIPAP titration the night before the MSLt through the sleep lab

## 2020-05-16 NOTE — Telephone Encounter (Signed)
Patient is scheduled for BiPAP Titration w/MSLT on 4/5 and 4/6. Patient understands her titration study will be done at [facility] sleep lab. Patient understands she will receive a letter in a week or so detailing appointment, date, time, and location. Patient understands to call if she does not receive the letter  in a timely manner. Patient agrees with treatment and thanked me for call.

## 2020-05-22 NOTE — Progress Notes (Signed)
[facility] PREP Weekly Session   Patient Details  Name: Erica [name] MRN: [id number] Date of Birth: Oct 26, 1975 Age: 45 y.o. PCP: [name], MD  Vitals:   05/22/20 1156  Weight: 203 lb 3.2 oz (92.2 kg)     [facility] [employer] Weekly seesion - 05/22/20 1100      Weekly Session   Topic Discussed Healthy eating tips    Minutes exercised this week --   none noted   Classes attended to date --   none noted   Comments 4            Pam Tally Joe 05/22/2020, 11:57 AM

## 2020-05-24 NOTE — Telephone Encounter (Signed)
I have spoken to patient to advise her that we have changed her hospital colonoscopy date to 07/12/20 at 915 am due to change in Dr Vena Rua schedule. She is agreeable to this and will still come for prep instructions 06/19/20 at 9 am.

## 2020-05-29 NOTE — Progress Notes (Signed)
[facility] PREP Weekly Session   Patient Details  Name: Erica [name] MRN: [id number] Date of Birth: 07/05/1975 Age: 45 y.o. PCP: [name], MD  Vitals:   05/29/20 1302  Weight: 204 lb 3.2 oz (92.6 kg)     [facility] [employer] Weekly seesion - 05/29/20 1300      Weekly Session   Topic Discussed Health habits;Water    Minutes exercised this week 35 minutes    Classes attended to date [place] 05/29/2020, 1:08 PM

## 2020-05-31 NOTE — Progress Notes (Signed)
[facility]  [phone] [phone]   ID: Erica [name]   DOB: December 10, 1975  MR#: [id number]  HWE#:[id number]  Patient Care Team: [name], MD as PCP - General (Internal Medicine) [name], MD as PCP - Cardiology (Cardiology) [name], MD as PCP - Sleep Medicine (Sleep Medicine) Kawhi Diebold, Virgie Dad, MD as Referring Physician (Hematology and Oncology) [name], MD as Consulting Physician (Cardiology) Ross Marcus, PA-C (Neurology) Tanda Rockers, MD as Consulting Physician (Pulmonary Disease) Chancy Milroy, MD as Consulting Physician (Obstetrics and Gynecology) Alda Berthold, DO as Consulting Physician (Neurology) OTHER MD:    CHIEF COMPLAINT:  Triple negative Breast Cancer  CURRENT TREATMENT: Goserelin monthly   INTERVAL HISTORY: Erica [name] returns today for follow-up of her estrogen receptor negative breast cancer. She continues under observation.  She continues to receive goserelin every 4 weeks.  Her most recent dose was 05/04/2020 and she is due a dose today.  She tolerates this with no side effects that she is aware of  Since her last visit, she has not undergone any additional studies. She remains behind on mammography, most recently performed at [facility] on 11/20/2017.   REVIEW OF SYSTEMS: Erica [name] tells me she was very ill between August and October 2021.  It was not Covid she says.  She tells me she has pain in the left breast which comes and goes.  She has had no unusual headaches, nausea or vomiting, gait imbalance or falls.  A detailed review of systems was otherwise stable .  COVID 19 VACCINATION STATUS:    BREAST CANCER HISTORY: From the original intake note:  Erica [name] noticed a lump in her left breast sometime in August or September 2009.  Initially this was thought to possibly be "dry breast milk" but as the mass got bigger, she brought it to her physician's attention and was set  up for mammography.  This was performed May 20, 2008 at [facility] and it showed a palpable 8.5 cm oval circumscribed mass in the left breast upper outer quadrant.  There were prominent left axillary lymph nodes noted as well.  An ultrasound showed this to be hypoechoic, with lobulated margins and probable central necrosis measuring up to 7.3 cm on this modality.  Ultrasound of the left axilla demonstrated numerous prominent lymph nodes at the upper limit of normal, the largest measuring 1 cm with a normal appearing thin cortex and a normal cortex to halo ratio.    Because the patient had no insurance, she was referred to [facility] and biopsy was performed in [place] with the pathology there ([id number]) showing a poorly differentiated malignancy which was triple negative (ER 1%, PR 0%, HER-2/neu 0).  The axillary lymph nodes were not biopsied. With this information, the patient was presented at the Breast Multidisciplinary Conference and the feeling was that, after appropriate staging, the patient would be a good candidate for neoadjuvant treatment and possibly NSABP B40.    Her subsequent history is as detailed below   PAST MEDICAL HISTORY: Past Medical History:  Diagnosis Date  . Allergy   . Anxiety   . Apnea, sleep 05/04/2014   AHI 31/hr now on CPAP at 12cm H2O  . Arthritis   . Asthma   . Asthma without status asthmaticus 01/19/2016  . Axillary hidradenitis suppurativa 08/01/2011  . Blood transfusion without reported diagnosis   . Breast cancer ([place]) 2010   a. locally advanced left breast carcinoma diagnosed in  2010 and treated with neoadjuvant chemotherapy with docetaxel and cyclophosphamide as well as paclitaxel. This was followed by radiation therapy which was completed in 2011.  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 CHF (congestive heart failure) ([place])   . Chronic diastolic CHF (congestive heart failure) ([place]) 06/23/2013  . Chronic kidney disease   .  Chronic pain in left foot 06/29/2018  . Cough variant asthma 09/27/2010   Followed in Pulmonary clinic/ [facility]/ [place]  Onset reported in childhood  - PFT's 01/31/2011 FEV1  1.76 (60% ) ratio 68 and no better p B2,  DLCO 70% -med review 09/25/2012 > [place] drug assistance  paperwork given    - HFA 90% 11/20/2012 .  -Med calendar 12/07/12 , 04/06/2013 , 04/18/2014,, 08/30/2014 , 11/08/2014  - PFT's 01/12/2013  1.73 (65%)  Ratio 73 and no better p B2, DLCO 86% - trial off   . 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 Dyspnea on exertion    a. 01/2013 Lexi MV: EF 54%, no ischemia/infarct;  b. 05/2013 Echo: EF 60-65%, no rwma, Gr 2 DD;  b.   . Essential hypertension 10/09/2014   Estimated Creatinine Clearance: 82.4 mL/min (by C-G formula based on SCr of 0.98 mg/dL).  . Fatty liver   . Gastroparesis   . GERD (gastroesophageal reflux disease)   . Hyperglycemia 04/13/2011  . Hypertension   . Hypertensive heart disease 09/26/2015  . Hypertriglyceridemia without hypercholesterolemia 06/15/2015   Framingham risk score is 1%   . Idiopathic intracranial hypertension 10/21/2014  . Impaired glucose tolerance 04/13/2011  . Insomnia secondary to anxiety 04/05/2015  . Lymphadenitis, chronic    restricted LEFT extremity  . Malignant neoplasm of upper-outer quadrant of left breast in female, estrogen receptor negative ([place]) 01/07/2013   Stage III, Invasive Ductal, s/p partial Left Mastectomy/Lumpectomy (baseball sized) with Lymph Node dissection, had Chemo and RadRx    . 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 OSA (obstructive sleep apnea) 05/04/2014   AHI 31/hr now on CPAP at 12cm H2O  . Oxygen deficiency    Uses 02 in CPAP  . Persistent proteinuria 03/31/2018  . Personal history of chemotherapy 2010  . Personal history of radiation therapy 2010  . Sleep apnea   . Tibialis posterior tendinitis, left 07/15/2018   Injected in March 10, 2019  . Upper airway cough syndrome 10/09/2014   Onset around 2012 with assoc  pseudowheeze MRI 05/18/15 > Paranasal sinuses are clear - 11/17/2015 rec increase h1 to 2 hs to help with noct cough  - Allergy profile 11/17/2015 >  Eos 0.1 /  IgE  80 POS RAST  to grass, ragweed, cedar tree  - d/c acei 03/06/2018  - flare end of Jan 2020 in setting of ? Uri/ poorly controlled GERD - flare mid Oct 2020 with uri / assoc secondary gerd  . Vitamin D deficiency disease 03/31/2018    PAST SURGICAL HISTORY: Past Surgical History:  Procedure Laterality Date  . BRAIN SURGERY     Shunt  . BREAST LUMPECTOMY Left 09/2008  . CARPAL TUNNEL RELEASE Left 2011  . LYMPH NODE DISSECTION Left 2010   breast; 2 wks after breast lumpectomy  . RIGHT/LEFT HEART CATH AND CORONARY ANGIOGRAPHY N/A 05/04/2019   Procedure: RIGHT/LEFT HEART CATH AND CORONARY ANGIOGRAPHY;  Surgeon: Jolaine Artist, MD;  Location: [facility];  Service: Cardiovascular;  Laterality: N/A;  Renal injection preformed   . TENDON RELEASE Right 2012   "hand"  . TUBAL LIGATION  05/11/2011   Procedure: POST PARTUM TUBAL LIGATION;  Surgeon: Emeterio Reeve, MD;  Location: [facility];  Service: Gynecology;  Laterality: Bilateral;  Induced for HTN    FAMILY HISTORY Family History  Problem Relation Age of Onset  . Other Mother 108       breast calcifications treated with surgery, breast cancer pill, and radiation  . Fibroids Sister 55       s/p TAH-BSO  . Diabetes Maternal Grandmother   . Heart disease Maternal Grandmother   . Hypertension Maternal Grandmother   . CAD Maternal Grandmother   . Breast cancer Other        maternal great aunt (MGM's sister)  . Breast cancer Other        paternal great aunt dx middle ages; s/p mastectomy  . Cancer Neg Hx   . Alcohol abuse Neg Hx   . Early death Neg Hx   . Hyperlipidemia Neg Hx   . Kidney disease Neg Hx   . Stroke Neg Hx   . Colon cancer Neg Hx   . Esophageal cancer Neg Hx   . Stomach cancer Neg Hx   . Rectal cancer Neg Hx   The patient's parents are alive. The patient has  one brother and one sister.  There is no ovarian or breast cancer in the immediate family, but the patient's maternal grandmother, who was one of 6 sisters, had one sister with breast cancer diagnosed in her 64.     GYNECOLOGIC HISTORY: updated January 2015 The patient is GX P4, first pregnancy to term at age 51.  She stopped having periods at the time of chemotherapy, and had had no periods for  2 years, then became pregnant in 2012. This was a surprise to her, and she was not aware of the pregnancy until the seventh month. She has not resumed normal menses, but has had some "spotting" on and off for the last several months.   SOCIAL HISTORY: updated March 2021 She has been a Dance movement psychotherapist in the past. She's currently disabled. She splits her time between [place] and [place], [place]. She describes herself as single.  At home is just she and her daughter Lenda Kelp, who works as a Quarry manager.  Ledora has 3 other children, all younger.   The patient is a Psychologist, forensic    ADVANCED DIRECTIVES: Not in place  HEALTH MAINTENANCE:   Social History   Tobacco Use  . Smoking status: Never Smoker  . Smokeless tobacco: Never Used  Vaping Use  . Vaping Use: Never used  Substance Use Topics  . Alcohol use: Yes    Alcohol/week: 0.0 standard drinks    Comment: occasionally/socially  . Drug use: No     Allergies  Allergen Reactions  . Aspirin Shortness Of Breath and Palpitations    Pt can take ibuprofen without reaction  . Doxycycline Anaphylaxis  . Kiwi Extract Itching and Swelling    Lip swelling  . Metoprolol Hives and Itching  . Penicillins Shortness Of Breath and Palpitations    Has patient had a PCN reaction causing immediate rash, facial/tongue/throat swelling, SOB or lightheadedness with hypotension: Yes Has patient had a PCN reaction causing severe rash involving mucus membranes or skin necrosis: Yes Has patient had a PCN reaction that required hospitalization Yes Has patient had a PCN  reaction occurring within the last 10 years: No If all of the above answers are "NO", then may proceed with Cephalosporin use.  . Strawberry Extract Hives  . Sulfamethoxazole-Trimethoprim  Anaphylaxis    Facial, throat and tongue swelling with difficulty swallowing.    Current Outpatient Medications  Medication Sig Dispense Refill  . acetaminophen-codeine (TYLENOL #4) 300-60 MG tablet Take 1 tablet by mouth every 6 (six) hours as needed for pain. (Patient taking differently: Take 1 tablet by mouth every 6 (six) hours as needed (for cough).) 30 tablet 0  . acetaZOLAMIDE (DIAMOX) 250 MG tablet Take 4 tablets (1,000 mg total) by mouth 3 (three) times daily. 1080 tablet 3  . albuterol (PROVENTIL) (2.5 MG/3ML) 0.083% nebulizer solution USE 1 VIAL VIA NEBULIZER EVERY 4 HOURS AND AS NEEDED FOR WHEEZING OR SHORTNESS OF BREATH 75 mL 1  . albuterol (VENTOLIN HFA) 108 (90 Base) MCG/ACT inhaler INHALE 2 PUFFS BY MOUTH EVERY 4 HOURS AS NEEDED FOR WHEEZE OR FOR SHORTNESS OF BREATH 18 Inhaler 1  . amitriptyline (ELAVIL) 100 MG tablet TAKE 1 TABLET BY MOUTH EVERYDAY AT BEDTIME 90 tablet 0  . amLODipine (NORVASC) 10 MG tablet Take 1 tablet (10 mg total) by mouth daily. 180 tablet 3  . Biotin 5000 MCG TABS Take 5,000 mg by mouth 2 (two) times daily.     . budesonide-formoterol (SYMBICORT) 160-4.5 MCG/ACT inhaler INHALE 2 PUFFS INTO LUNGS 2 TIMES DAILY AS DIRECTED 10.2 g 11  . Cholecalciferol 1.25 MG (50000 UT) capsule Take 1 capsule (50,000 Units total) by mouth once a week. 12 capsule 1  . cloNIDine (CATAPRES - DOSED IN MG/24 HR) 0.3 mg/24hr patch PLACE 2 PATCHES ONTO SKIN ONCE WEEKLY 24 patch 1  . clopidogrel (PLAVIX) 75 MG tablet Take 1 tablet (75 mg total) by mouth daily. 90 tablet 3  . clotrimazole (MYCELEX) 10 MG troche Take 1 tablet (10 mg total) by mouth 5 (five) times daily. (Patient taking differently: Take 10 mg by mouth as needed.) 30 tablet 11  . dexlansoprazole (DEXILANT) 60 MG capsule Take 1 capsule  (60 mg total) by mouth daily. 90 capsule 2  . dextromethorphan-guaiFENesin (MUCINEX DM) 30-600 MG 12hr tablet Take 2 tablets by mouth 2 (two) times daily.    Marland Kitchen doxazosin (CARDURA) 8 MG tablet Take 1 tablet (8 mg total) by mouth 2 (two) times daily. 180 tablet 3  . DULoxetine (CYMBALTA) 30 MG capsule TAKE 1 CAPSULE (30 MG TOTAL) BY MOUTH DAILY. WITH $RemoveBef'60MG'wlbQojlilJ$  FOR TOTAL OF 90 MG 30 capsule 3  . DULoxetine (CYMBALTA) 60 MG capsule TAKE 1 CAPSULE (60 MG TOTAL) BY MOUTH DAILY. FOR TOTAL OF 90 MG 30 capsule 3  . EPINEPHRINE 0.3 mg/0.3 mL IJ SOAJ injection INJECT 0.3 MLS INTO THE MUSCLE ONCE AS NEEDED FOR ANAPHYLAXIS 2 Device 2  . ezetimibe (ZETIA) 10 MG tablet Take 1 tablet (10 mg total) by mouth daily. 90 tablet 3  . famotidine (PEPCID) 20 MG tablet Take 1-2 tablets (20-40 mg total) by mouth at bedtime. 180 tablet 2  . gabapentin (NEURONTIN) 300 MG capsule Take 1 capsule (300 mg total) by mouth 3 (three) times daily. One three times daily 90 capsule 2  . hydrALAZINE (APRESOLINE) 50 MG tablet Take 1 tablet (50 mg total) by mouth 3 (three) times daily. 270 tablet 3  . hydrochlorothiazide (HYDRODIURIL) 25 MG tablet Take 1 tablet (25 mg total) by mouth daily. 90 tablet 3  . lidocaine-prilocaine (EMLA) cream Apply 1 application topically every 28 (twenty-eight) days. Apply 1 application to skin ever 28 days    . metoCLOPramide (REGLAN) 10 MG tablet Take 1 tablet (10 mg total) by mouth 4 (four) times daily. 360 tablet 1  .  minoxidil (LONITEN) 2.5 MG tablet Take 2 tablets (5 mg total) by mouth 2 (two) times daily. 120 tablet 3  . montelukast (SINGULAIR) 10 MG tablet TAKE 1 TABLET BY MOUTH EVERYDAY AT BEDTIME 90 tablet 3  . Nebivolol HCl 20 MG TABS Take 20 mg by mouth as directed. TAKE 2 TABLETS AT BEDTIME    . NITROSTAT 0.4 MG SL tablet PLACE 1 TABLET UNDER TONGUE EVERY 5 MINUTES AS NEEDED FOR CHEST PAIN, MAX OF 3 TABLETS THEN CALL 911 IF PAIN PERSISTS 100 tablet 0  . oxymetazoline (MUCINEX SINUS-MAX FULL FORCE)  0.05 % nasal spray Place 1 spray into both nostrils 2 (two) times daily.    . Potassium Chloride ER 20 MEQ TBCR TAKE 1 TABLET BY MOUTH TWICE A DAY 180 tablet 0  . primidone (MYSOLINE) 50 MG tablet Take 0.5 tablets (25 mg total) by mouth at bedtime. 45 tablet 3  . promethazine (PHENERGAN) 25 MG suppository Place 25 mg rectally every 6 (six) hours as needed for nausea or vomiting.     . promethazine (PHENERGAN) 25 MG tablet Take 1 tablet (25 mg total) by mouth 3 (three) times daily as needed for nausea or vomiting. 270 tablet 1  . promethazine-dextromethorphan (PROMETHAZINE-DM) 6.25-15 MG/5ML syrup Take 5 mLs by mouth 4 (four) times daily as needed for cough. 473 mL 2  . ranolazine (RANEXA) 1000 MG SR tablet Take 1 tablet (1,000 mg total) by mouth 2 (two) times daily. 180 tablet 3  . Respiratory Therapy Supplies (FLUTTER) DEVI Use as directed 1 each 0  . rosuvastatin (CRESTOR) 40 MG tablet Take 1 tablet (40 mg total) by mouth daily. 90 tablet 3  . sacubitril-valsartan (ENTRESTO) 97-103 MG Take 1 tablet by mouth 2 (two) times daily. 60 tablet 5  . Spacer/Aero-Holding Chambers (AEROCHAMBER MV) inhaler Use as instructed 1 each 0  . spironolactone (ALDACTONE) 100 MG tablet TAKE 1 TABLET BY MOUTH EVERY DAY 90 tablet 2  . torsemide (DEMADEX) 20 MG tablet Take 1 tablet (20 mg total) by mouth 2 (two) times daily. 180 tablet 3   No current facility-administered medications for this visit.   Facility-Administered Medications Ordered in Other Visits  Medication Dose Route Frequency Provider Last Rate Last Admin  . goserelin (ZOLADEX) injection 3.6 mg  3.6 mg Subcutaneous Q28 days Deshane Cotroneo, Virgie Dad, MD        OBJECTIVE:  African-American woman in no acute distress  Vitals:   06/01/20 0859  BP: (!) 175/78  Pulse: 82  Resp: 18  Temp: 97.9 F (36.6 C)  SpO2: 100%   Wt Readings from Last 3 Encounters:  06/01/20 204 lb 12.8 oz (92.9 kg)  05/29/20 204 lb 3.2 oz (92.6 kg)  05/22/20 203 lb 3.2 oz (92.2  kg)   Body mass index is 33.06 kg/m.    ECOG FS:2 - Symptomatic, <50% confined to bed  Sclerae unicteric, EOMs intact Wearing a mask No cervical or supraclavicular adenopathy Lungs no rales or rhonchi Heart regular rate and rhythm Abd soft, nontender, positive bowel sounds MSK no focal spinal tenderness Neuro: nonfocal, well oriented, appropriate affect Breasts: The right breast is unremarkable.  The left breast is status post lumpectomy and radiation.  It is smaller than the right breast and firmer.  It is also slightly darker.  There is no evidence of local recurrence.  Both axillae are benign.   LAB RESULTS: Lab Results  Component Value Date   WBC 6.5 02/28/2020   NEUTROABS 3.5 02/28/2020   HGB 13.5  02/28/2020   HCT 40.2 02/28/2020   MCV 86 02/28/2020   PLT 247 02/28/2020      Chemistry      Component Value Date/Time   NA 143 04/10/2020 0942   NA 142 03/17/2017 0835   K 3.6 04/10/2020 0942   K 3.6 03/17/2017 0835   CL 102 04/10/2020 0942   CL 103 09/10/2012 1618   CO2 25 04/10/2020 0942   CO2 27 03/17/2017 0835   BUN 15 04/10/2020 0942   BUN 10.1 03/17/2017 0835   CREATININE 1.07 (H) 04/10/2020 0942   CREATININE 1.27 (H) 12/09/2018 1222   CREATININE 0.9 03/17/2017 0835      Component Value Date/Time   CALCIUM 9.4 04/10/2020 0942   CALCIUM 9.5 03/17/2017 0835   ALKPHOS 68 02/28/2020 0841   ALKPHOS 77 03/17/2017 0835   AST 16 02/28/2020 0841   AST 24 12/09/2018 1222   AST 17 03/17/2017 0835   ALT 23 02/28/2020 0841   ALT 27 12/09/2018 1222   ALT 24 03/17/2017 0835   BILITOT 0.3 02/28/2020 0841   BILITOT 0.4 12/09/2018 1222   BILITOT 0.50 03/17/2017 0835      STUDIES: No results found.   ASSESSMENT: 45 y.o. [place] woman with history of locally advanced left breast carcinoma initially presenting in March 2010   (1) treated neoadjuvantly with 4 cycles of docetaxel/ cyclophosphamide.    (2) status post left lumpectomy and sentinel lymph node  dissection in July 2010 for a ypT2 ypN1, stage IIB invasive ductal carcinoma, grade 3, triple negative, with MIB-1 of 88%.   (3) status post full axillary dissection and margin clearance in August 2010.   (4) status post 11 doses of weekly paclitaxel completed in December 2010   (5) radiation therapy completed in March 2011; now followed with observation alone.   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  This is very favorable.  We continue on Zoladex/goserelin monthly.  I offered to switch her to every 3 months but she is concerned that the dose may be difficult to receive.  The injection certainly is larger.  Accordingly she is continuing on every 28 days and we will do that until she is age 36 at which time we will stop and see if she has entered menopause  I think she will benefit from a survivorship visit and I will set that up for a year from now  I have written for her to have her screening mammogram.  She is a bit behind and she will let me know if they do not contact her within the next few weeks  Total encounter time 25 minutes.*    Erica [name], Virgie Dad, MD  06/01/20 9:31 AM Medical Oncology and Hematology [facility] [place], [place] [address] Tel. [phone]    Fax. [phone]   I, Wilburn Mylar, am acting as scribe for Dr. Virgie Dad. Hamzeh Tall.  I, Lurline Del MD, have reviewed the above documentation for accuracy and completeness, and I agree with the above.    *Total Encounter Time as defined by the Centers for Medicare and Medicaid Services includes, in addition to the  face-to-face time  of a patient visit (documented in the note above) non-face-to-face time: obtaining and reviewing outside history, ordering and reviewing medications, tests or procedures, care coordination (communications with other health care professionals or caregivers) and documentation in the medical record.  

## 2020-06-01 ENCOUNTER — Inpatient Hospital Stay (HOSPITAL_BASED_OUTPATIENT_CLINIC_OR_DEPARTMENT_OTHER): Payer: Medicare Other | Admitting: Oncology

## 2020-06-01 ENCOUNTER — Inpatient Hospital Stay: Payer: Medicare Other | Attending: Oncology

## 2020-06-01 ENCOUNTER — Ambulatory Visit: Payer: Medicare Other

## 2020-06-01 ENCOUNTER — Other Ambulatory Visit: Payer: Self-pay

## 2020-06-01 VITALS — BP 175/78 | HR 82 | Temp 97.9°F | Resp 18 | Ht 66.0 in | Wt 204.8 lb

## 2020-06-01 DIAGNOSIS — C50412 Malignant neoplasm of upper-outer quadrant of left female breast: Secondary | ICD-10-CM

## 2020-06-01 DIAGNOSIS — Z171 Estrogen receptor negative status [ER-]: Secondary | ICD-10-CM | POA: Diagnosis not present

## 2020-06-01 DIAGNOSIS — Z79818 Long term (current) use of other agents affecting estrogen receptors and estrogen levels: Secondary | ICD-10-CM | POA: Insufficient documentation

## 2020-06-01 MED ORDER — GOSERELIN ACETATE 3.6 MG ~~LOC~~ IMPL
DRUG_IMPLANT | SUBCUTANEOUS | Status: AC
  Filled 2020-06-01: qty 3.6

## 2020-06-01 MED ORDER — GOSERELIN ACETATE 3.6 MG ~~LOC~~ IMPL
3.6000 mg | DRUG_IMPLANT | SUBCUTANEOUS | Status: AC
  Administered 2020-06-01: 3.6 mg via SUBCUTANEOUS

## 2020-06-01 NOTE — Patient Instructions (Signed)

## 2020-06-06 ENCOUNTER — Other Ambulatory Visit: Payer: Self-pay | Admitting: Oncology

## 2020-06-15 ENCOUNTER — Encounter: Payer: Self-pay | Admitting: Family Medicine

## 2020-06-16 ENCOUNTER — Other Ambulatory Visit: Payer: Self-pay

## 2020-06-16 ENCOUNTER — Ambulatory Visit (INDEPENDENT_AMBULATORY_CARE_PROVIDER_SITE_OTHER): Payer: Medicare Other | Admitting: Cardiovascular Disease

## 2020-06-16 ENCOUNTER — Encounter: Payer: Self-pay | Admitting: Cardiovascular Disease

## 2020-06-16 ENCOUNTER — Telehealth: Payer: Self-pay | Admitting: Licensed Clinical Social Worker

## 2020-06-16 VITALS — BP 172/100 | HR 78 | Ht 66.0 in | Wt 204.4 lb

## 2020-06-16 DIAGNOSIS — G4733 Obstructive sleep apnea (adult) (pediatric): Secondary | ICD-10-CM

## 2020-06-16 DIAGNOSIS — I10 Essential (primary) hypertension: Secondary | ICD-10-CM

## 2020-06-16 DIAGNOSIS — Z5181 Encounter for therapeutic drug level monitoring: Secondary | ICD-10-CM | POA: Diagnosis not present

## 2020-06-16 DIAGNOSIS — I5032 Chronic diastolic (congestive) heart failure: Secondary | ICD-10-CM | POA: Diagnosis not present

## 2020-06-16 MED ORDER — DILTIAZEM HCL ER COATED BEADS 240 MG PO CP24: 240.0000 mg | ORAL_CAPSULE | Freq: Every day | ORAL | 3 refills | Status: DC

## 2020-06-16 NOTE — Patient Instructions (Signed)
Medication Instructions:  START DILTIAZEM 240 MG DAILY   Labwork: CORTISOL/BMET TODAY   Testing/Procedures: NONE  Follow-Up: 08/22/2020 AT 8:00 AM   If you need a refill on your cardiac medications before your next appointment, please call your pharmacy.

## 2020-06-16 NOTE — Progress Notes (Signed)
Hypertension Clinic Follow up:    Date:  06/16/2020   ID:  Erica [name], DOB Apr 12, 1975, MRN [id number]  PCP:  [name], MD  Cardiologist:  No primary care provider on file.  Nephrologist:  Referring MD: [name], MD   CC: Hypertension  History of Present Illness:    Erica [name] is a 45 y.o. female with a hx of resistant hypertension, chronic diastolic heart failure, breast cancer s/p chemo and XRT, morbid obesity and OSA intolerant of CPAP here to establish care in the hypertension clinic. She reports that her BP has been elevated since she was treated for breast cancer in 2010.  Since then her blood pressure has never been very well have been controlled.  She has been on multiple antihypertensives and her blood pressure remains in the 258N to 277O systolic.  She struggles with exertional dyspnea that makes it hard for her to walk even short distances.  Echo 03/2018 revealed LVEF 45 to 50% with grade 2 diastolic dysfunction.  There was mild MR.  PASP was 33.  [name] has a history of recurrent syncope.  She had an episode that occurred while wearing an ambulatory monitor and the episode was felt to be due to narcolepsy.  She has sleep apnea but is not tolerant of her CPAP machine.  She now uses her BIPAP machine but struggles with insomnia.  She wakes up hourly.  She underwent left and right heart catheterization on 05/04/2019.  She was found to have 60% distal left circumflex and 80% distal LAD disease.  Given her aspirin allergy Dr. Meda Coffee recommended that she start clopidogrel.  Right atrial pressure was 10, and PA pressure was 26/10.  PCWP 18.  LVEDP was 23 mmHg.  At her first hypertension clinic visit there were concerns about whether she was taking her medication as prescribed.  Discrepancies between what was ordered and what she was actually picking up from the pharmacy.  She continues to report taking all medications as prescribed.  Her blood pressures continue to run  quite elevated.  At her initial assessment doxazosin was added to her regimen.  This does not seem to have helped much.  She followed up with our pharmacist and was started on amlodipine.  She is unable to exercise due to poorly controlled blood pressure.  She has 1 caffeinated drink daily and does not use over-the-counter supplements or medications.  She continues to try to minimize her sodium intake.  Renal artery Dopplers revealed 1- 59% stenosis bilaterally in 2019.  CT-A of the abdomen showed a 1.3 cm renal cyst but no adrenal adenomas.  There was no evidence of renal artery stenosis.  [name] saw our pharmacist on 12/2019.  She was in a lot of pain at the time.  Given her high number or medications and her pain, her medications were not changed.  She has struggled with LE edema.  LE Doppler was negative for DVT 10/2019.  She was given metolazone to take prior to torsemide.  For the last two months she has been more swollen but it is finally better.  Her weight has decreased from 211 lb to 187 lb.  [name] continues to struggle with shortness of breath. Her edema resolved but there was no improvement in her breathing.  She uses her CPAP every other night.  She notes that there is a recall on her CPAP and she isn't supposed to be using it.  However, she can't sleep  well without it. Lately her BP at home has been in the 150-200s.  She notes that when she has pain it is higher.  She has a lot of joint pain, especially in her feel.    [name] wanted to reduce her medications.  However her blood pressure was significantly elevated.  We reduced hydralazine to 50 mg 3 times a day and increase doxazosin to 8 mg twice daily in an effort to try and get her off of her 3 times daily medications and improve compliance.  Ranexa was increased due to chest pain.  This seems to have helped her chest pain.  It seems to occur randomly both at rest and with exertion.  Her clonidine was increased to 0.6 mg every 24 hours.  She wore an  ambulatory monitor that showed PVCs and up to 8 beats of SVT.  The episodes seem to occur mostly in the early a.m.  She started going to the PREP program at the [facility].  She notes that her heart rate goes up quickly.  It makes her feel exhausted.  She takes her rest when she needs to and start back exercising.  She has lower extremity edema by the end of the day but no orthopnea or PND.  She did get a new CPAP machine and struggles to use it because of claustrophobia.  However she does use it every night.  Her blood pressure at home has been in the 140s to 150s.  Lately she has been more anxious because her nephew died in a car accident last week.  Her blood pressure goes up whenever she thinks of him.  Past Medical History:  Diagnosis Date  . Allergy   . Anxiety   . Apnea, sleep 05/04/2014   AHI 31/hr now on CPAP at 12cm H2O  . Arthritis   . Asthma   . Asthma without status asthmaticus 01/19/2016  . Axillary hidradenitis suppurativa 08/01/2011  . Blood transfusion without reported diagnosis   . Breast cancer ([place]) 2010   a. locally advanced left breast carcinoma diagnosed in 2010 and treated with neoadjuvant chemotherapy with docetaxel and cyclophosphamide as well as paclitaxel. This was followed by radiation therapy which was completed in 2011.  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 CHF (congestive heart failure) ([place])   . Chronic diastolic CHF (congestive heart failure) ([place]) 06/23/2013  . Chronic kidney disease   . Chronic pain in left foot 06/29/2018  . Cough variant asthma 09/27/2010   Followed in Pulmonary clinic/ [facility]/ [place]  Onset reported in childhood  - PFT's 01/31/2011 FEV1  1.76 (60% ) ratio 68 and no better p B2,  DLCO 70% -med review 09/25/2012 > [place] drug assistance  paperwork given    - HFA 90% 11/20/2012 .  -Med calendar 12/07/12 , 04/06/2013 , 04/18/2014,, 08/30/2014 , 11/08/2014  - PFT's 01/12/2013  1.73 (65%)  Ratio 73 and no better p B2,  DLCO 86% - trial off   . 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 Dyspnea on exertion    a. 01/2013 Lexi MV: EF 54%, no ischemia/infarct;  b. 05/2013 Echo: EF 60-65%, no rwma, Gr 2 DD;  b.   . Essential hypertension 10/09/2014   Estimated Creatinine Clearance: 82.4 mL/min (by C-G formula based on SCr of 0.98 mg/dL).  . Fatty liver   . Gastroparesis   . GERD (gastroesophageal reflux disease)   . Hyperglycemia 04/13/2011  . Hypertension   . Hypertensive heart disease 09/26/2015  . Hypertriglyceridemia  without hypercholesterolemia 06/15/2015   Framingham risk score is 1%   . Idiopathic intracranial hypertension 10/21/2014  . Impaired glucose tolerance 04/13/2011  . Insomnia secondary to anxiety 04/05/2015  . Lymphadenitis, chronic    restricted LEFT extremity  . Malignant neoplasm of upper-outer quadrant of left breast in female, estrogen receptor negative ([place]) 01/07/2013   Stage III, Invasive Ductal, s/p partial Left Mastectomy/Lumpectomy (baseball sized) with Lymph Node dissection, had Chemo and RadRx    . 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 OSA (obstructive sleep apnea) 05/04/2014   AHI 31/hr now on CPAP at 12cm H2O  . Oxygen deficiency    Uses 02 in CPAP  . Persistent proteinuria 03/31/2018  . Personal history of chemotherapy 2010  . Personal history of radiation therapy 2010  . Sleep apnea   . Tibialis posterior tendinitis, left 07/15/2018   Injected in March 10, 2019  . Upper airway cough syndrome 10/09/2014   Onset around 2012 with assoc pseudowheeze MRI 05/18/15 > Paranasal sinuses are clear - 11/17/2015 rec increase h1 to 2 hs to help with noct cough  - Allergy profile 11/17/2015 >  Eos 0.1 /  IgE  80 POS RAST  to grass, ragweed, cedar tree  - d/c acei 03/06/2018  - flare end of Jan 2020 in setting of ? Uri/ poorly controlled GERD - flare mid Oct 2020 with uri / assoc secondary gerd  . Vitamin D deficiency disease 03/31/2018    Past Surgical History:  Procedure Laterality Date  .  BRAIN SURGERY     [place]  . BREAST LUMPECTOMY Left 09/2008  . CARPAL TUNNEL RELEASE Left 2011  . LYMPH NODE DISSECTION Left 2010   breast; 2 wks after breast lumpectomy  . RIGHT/LEFT HEART CATH AND CORONARY ANGIOGRAPHY N/A 05/04/2019   Procedure: RIGHT/LEFT HEART CATH AND CORONARY ANGIOGRAPHY;  Surgeon: Jolaine Artist, MD;  Location: [facility];  Service: Cardiovascular;  Laterality: N/A;  Renal injection preformed   . TENDON RELEASE Right 2012   "hand"  . TUBAL LIGATION  05/11/2011   Procedure: POST PARTUM TUBAL LIGATION;  Surgeon: Emeterio Reeve, MD;  Location: [facility];  Service: Gynecology;  Laterality: Bilateral;  Induced for HTN    Current Medications: Current Meds  Medication Sig  . acetaminophen-codeine (TYLENOL #4) 300-60 MG tablet Take 1 tablet by mouth every 6 (six) hours as needed for pain. (Patient taking differently: Take 1 tablet by mouth every 6 (six) hours as needed (for cough).)  . acetaZOLAMIDE (DIAMOX) 250 MG tablet Take 4 tablets (1,000 mg total) by mouth 3 (three) times daily.  Marland Kitchen albuterol (PROVENTIL) (2.5 MG/3ML) 0.083% nebulizer solution USE 1 VIAL VIA NEBULIZER EVERY 4 HOURS AND AS NEEDED FOR WHEEZING OR SHORTNESS OF BREATH  . albuterol (VENTOLIN HFA) 108 (90 Base) MCG/ACT inhaler INHALE 2 PUFFS BY MOUTH EVERY 4 HOURS AS NEEDED FOR WHEEZE OR FOR SHORTNESS OF BREATH  . amitriptyline (ELAVIL) 100 MG tablet TAKE 1 TABLET BY MOUTH EVERYDAY AT BEDTIME  . amLODipine (NORVASC) 10 MG tablet Take 1 tablet (10 mg total) by mouth daily.  . Biotin 5000 MCG TABS Take 5,000 mg by mouth 2 (two) times daily.   . budesonide-formoterol (SYMBICORT) 160-4.5 MCG/ACT inhaler INHALE 2 PUFFS INTO LUNGS 2 TIMES DAILY AS DIRECTED  . Cholecalciferol 1.25 MG (50000 UT) capsule Take 1 capsule (50,000 Units total) by mouth once a week.  . cloNIDine (CATAPRES - DOSED IN MG/24 HR) 0.3 mg/24hr patch PLACE 2 PATCHES ONTO SKIN ONCE  WEEKLY  . clopidogrel (PLAVIX) 75 MG tablet Take 1 tablet (75 mg  total) by mouth daily.  . clotrimazole (MYCELEX) 10 MG troche Take 1 tablet (10 mg total) by mouth 5 (five) times daily. (Patient taking differently: Take 10 mg by mouth as needed.)  . dexlansoprazole (DEXILANT) 60 MG capsule Take 1 capsule (60 mg total) by mouth daily.  Marland Kitchen dextromethorphan-guaiFENesin (MUCINEX DM) 30-600 MG 12hr tablet Take 2 tablets by mouth 2 (two) times daily.  Marland Kitchen diltiazem (CARDIZEM CD) 240 MG 24 hr capsule Take 1 capsule (240 mg total) by mouth daily.  Marland Kitchen doxazosin (CARDURA) 8 MG tablet Take 1 tablet (8 mg total) by mouth 2 (two) times daily.  . DULoxetine (CYMBALTA) 30 MG capsule TAKE 1 CAPSULE (30 MG TOTAL) BY MOUTH DAILY. WITH 60MG  FOR TOTAL OF 90 MG  . DULoxetine (CYMBALTA) 60 MG capsule TAKE 1 CAPSULE (60 MG TOTAL) BY MOUTH DAILY. FOR TOTAL OF 90 MG  . EPINEPHRINE 0.3 mg/0.3 mL IJ SOAJ injection INJECT 0.3 MLS INTO THE MUSCLE ONCE AS NEEDED FOR ANAPHYLAXIS  . ezetimibe (ZETIA) 10 MG tablet Take 1 tablet (10 mg total) by mouth daily.  . famotidine (PEPCID) 20 MG tablet Take 1-2 tablets (20-40 mg total) by mouth at bedtime.  . gabapentin (NEURONTIN) 300 MG capsule Take 1 capsule (300 mg total) by mouth 3 (three) times daily. One three times daily  . hydrALAZINE (APRESOLINE) 50 MG tablet Take 1 tablet (50 mg total) by mouth 3 (three) times daily.  . hydrochlorothiazide (HYDRODIURIL) 25 MG tablet Take 1 tablet (25 mg total) by mouth daily.  Marland Kitchen lidocaine-prilocaine (EMLA) cream Apply 1 application topically every 28 (twenty-eight) days. Apply 1 application to skin ever 28 days  . metoCLOPramide (REGLAN) 10 MG tablet Take 1 tablet (10 mg total) by mouth 4 (four) times daily.  . minoxidil (LONITEN) 2.5 MG tablet Take 2 tablets (5 mg total) by mouth 2 (two) times daily.  . montelukast (SINGULAIR) 10 MG tablet TAKE 1 TABLET BY MOUTH EVERYDAY AT BEDTIME  . Nebivolol HCl 20 MG TABS Take 20 mg by mouth as directed. TAKE 2 TABLETS AT BEDTIME  . NITROSTAT 0.4 MG SL tablet PLACE 1 TABLET  UNDER TONGUE EVERY 5 MINUTES AS NEEDED FOR CHEST PAIN, MAX OF 3 TABLETS THEN CALL 911 IF PAIN PERSISTS  . oxymetazoline (MUCINEX SINUS-MAX FULL FORCE) 0.05 % nasal spray Place 1 spray into both nostrils 2 (two) times daily.  . Potassium Chloride ER 20 MEQ TBCR TAKE 1 TABLET BY MOUTH TWICE A DAY  . primidone (MYSOLINE) 50 MG tablet Take 0.5 tablets (25 mg total) by mouth at bedtime.  . promethazine (PHENERGAN) 25 MG suppository Place 25 mg rectally every 6 (six) hours as needed for nausea or vomiting.   . promethazine (PHENERGAN) 25 MG tablet Take 1 tablet (25 mg total) by mouth 3 (three) times daily as needed for nausea or vomiting.  . promethazine-dextromethorphan (PROMETHAZINE-DM) 6.25-15 MG/5ML syrup Take 5 mLs by mouth 4 (four) times daily as needed for cough.  . ranolazine (RANEXA) 1000 MG SR tablet Take 1 tablet (1,000 mg total) by mouth 2 (two) times daily.  Marland Kitchen Respiratory Therapy Supplies (FLUTTER) DEVI Use as directed  . rosuvastatin (CRESTOR) 40 MG tablet Take 1 tablet (40 mg total) by mouth daily.  . sacubitril-valsartan (ENTRESTO) 97-103 MG Take 1 tablet by mouth 2 (two) times daily.  Marland Kitchen Spacer/Aero-Holding Chambers (AEROCHAMBER MV) inhaler Use as instructed  . spironolactone (ALDACTONE) 100 MG tablet TAKE 1 TABLET  BY MOUTH EVERY DAY  . torsemide (DEMADEX) 20 MG tablet Take 1 tablet (20 mg total) by mouth 2 (two) times daily.     Allergies:   Aspirin, Doxycycline, Kiwi extract, Metoprolol, Penicillins, Strawberry extract, and Sulfamethoxazole-trimethoprim   Social History   Socioeconomic History  . Marital status: Single    Spouse name: Not on file  . Number of children: 3  . Years of education: Not on file  . Highest education level: Not on file  Occupational History    Employer: UNEMPLOYED  Tobacco Use  . Smoking status: Never Smoker  . Smokeless tobacco: Never Used  Vaping Use  . Vaping Use: Never used  Substance and Sexual Activity  . Alcohol use: Yes    Alcohol/week:  0.0 standard drinks    Comment: occasionally/socially  . Drug use: No  . Sexual activity: Yes    Birth control/protection: Surgical  Other Topics Concern  . Not on file  Social History Narrative   She lives with four children.   She is currently not working (disbaility pending).   Left handed   One story home   Drinks caffeine      Social Determinants of Health   Financial Resource Strain: Medium Risk  . Difficulty of Paying Living Expenses: Somewhat hard  Food Insecurity: No Food Insecurity  . Worried About Charity fundraiser in the Last Year: Never true  . Ran Out of Food in the Last Year: Never true  Transportation Needs: No Transportation Needs  . Lack of Transportation (Medical): No  . Lack of Transportation (Non-Medical): No  Physical Activity: Not on file  Stress: Not on file  Social Connections: Not on file     Family History: The patient's family history includes Breast cancer in some other family members; CAD in her maternal grandmother; Diabetes in her maternal grandmother; Fibroids (age of onset: 17) in her sister; Heart disease in her maternal grandmother; Hypertension in her maternal grandmother; Other (age of onset: 4) in her mother. There is no history of Cancer, Alcohol abuse, Early death, Hyperlipidemia, Kidney disease, Stroke, Colon cancer, Esophageal cancer, Stomach cancer, or Rectal cancer.  ROS:   Please see the history of present illness.     All other systems reviewed and are negative.  EKGs/Labs/Other Studies Reviewed:    EKG:  EKG is not ordered today.  The ekg ordered 04/23/19 demonstrates sinus tachycardia.  Rate 124 bpm.  LVH  Recent Labs: 02/28/2020: ALT 23; Hemoglobin 13.5; Platelets 247 04/10/2020: BUN 15; Creatinine, Ser 1.07; Magnesium 2.0; Potassium 3.6; Sodium 143   Recent Lipid Panel    Component Value Date/Time   CHOL 200 (H) 02/28/2020 0841   TRIG 167 (H) 02/28/2020 0841   HDL 40 02/28/2020 0841   CHOLHDL 5.0 (H) 02/28/2020  0841   CHOLHDL 4 10/01/2018 1115   VLDL 22.6 10/01/2018 1115   LDLCALC 130 (H) 02/28/2020 0841   LDLDIRECT 152.0 03/30/2018 1604    Echo 03/12/18: Study Conclusions  - Left ventricle: The cavity size was normal. Wall thickness was  increased in a pattern of moderate LVH. Systolic function was  mildly reduced. The estimated ejection fraction was in the range  of 45% to 50%. Wall motion was normal; there were no regional  wall motion abnormalities. Features are consistent with a  pseudonormal left ventricular filling pattern, with concomitant  abnormal relaxation and increased filling pressure (grade 2  diastolic dysfunction).  - Mitral valve: There was mild regurgitation.  - Pulmonary arteries: Systolic  pressure was mildly increased. PA  peak pressure: 33 mm Hg (S).   Lexiscan Myoview 04/20/18:   The left ventricular ejection fraction is moderately decreased (30-44%).  Nuclear stress EF: 41%.  There was no ST segment deviation noted during stress.  This is an intermediate risk study.   Abnormal, intermediate risk stress nuclear study with no ischemia or infarction; EF 41 with global hypokinesis and mild LVE; study intermediate risk due to reduced LV function.  Renal artery Doppler 10/29/18: Right: 1-59% stenosis of the right renal artery. Cyst(s) noted.  Left: 1-59% stenosis of the left renal artery.    LHC/RHC 05/04/19:  Dist LAD-1 lesion is 30% stenosed.  Dist LAD-2 lesion is 80% stenosed.  Dist Cx lesion is 60% stenosed.   Findings:  Ao = 173/104 (134) LV =  181/25 RA =  10 RV =  41/13 PA =  42/10 (26) PCW = 18 Fick cardiac output/index = 5.8/2.9 PVR = 1.4 wu Ao sat = 98% PA sat = 73%, 73%  Assessment: 1. 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 LVEF 55-60% 3. Mildly elevated filling pressures with mild pulmonary venous HTN m4. Widely patent bilateral renal arteries. No evidence of AAA 5. Severe systemic HTN    Physical Exam:     Wt Readings from Last 3 Encounters:  06/16/20 204 lb 6.4 oz (92.7 kg)  06/01/20 204 lb 12.8 oz (92.9 kg)  05/29/20 204 lb 3.2 oz (92.6 kg)    VS:  BP (!) 172/100   Pulse 78   Ht 5\' 6"  (1.676 m)   Wt 204 lb 6.4 oz (92.7 kg)   SpO2 99%   BMI 32.99 kg/m  , BMI Body mass index is 32.99 kg/m. GENERAL:  Well appearing HEENT: Pupils equal round and reactive, fundi not visualized, oral mucosa unremarkable NECK:  No jugular venous distention, waveform within normal limits, carotid upstroke brisk and symmetric, no bruits LUNGS:  Clear to auscultation bilaterally HEART:  RRR.  PMI not displaced or sustained,S1 and S2 within normal limits, no S3, no S4, no clicks, no rubs, II/VI systolic murmur at the LUSB ABD:  Abdominal bloating.  positive bowel sounds normal in frequency in pitch, no bruits, no rebound, no guarding, no midline pulsatile mass, no hepatomegaly, no splenomegaly EXT:  2 plus pulses throughout, no edema, no cyanosis no clubbing SKIN:  No rashes no nodules NEURO:  Cranial nerves II through XII grossly intact, motor grossly intact throughout PSYCH:  Cognitively intact, oriented to person place and time   ASSESSMENT:    1. Essential hypertension   2. Therapeutic drug monitoring   3. Chronic diastolic CHF (congestive heart failure) ([place])   4. Malignant hypertension   5. Obstructive sleep apnea syndrome     PLAN:    # Resistant hypertension:  # Chronic systolic and diastolic heart failure: Ms. Diana BP is elevated but improving.  Continue clonidine patch to 0.6 mg every 24 hours, nebivolol, doxazosin, hydralazine, minoxidil, and spironolactone, and Entresto.  Blood pressure is still elevated.  It seems to be improving at home but has been high lately in the setting of loss of a loved one.  She also has exercise intolerance because her heart rate goes up quickly.  We will add diltiazem 240 mg daily.  Check cortisol.  Check BMP.  Secondary Causes of  Hypertension  Medications/Herbal: OCP, steroids, stimulants, antidepressants, weight loss medication, immune suppressants, NSAIDs, sympathomimetics, alcohol, caffeine, licorice, ginseng, St. John's wort, chemo Cymbalta  Sleep Apnea: treated Renal  artery stenosis: Mild on ultrasound 09/2018.  Not seen on CT-A 2021 Hyperaldosteronism: normal 04/2019 Hyper/hypothyroidism: TSH normal 04/2019 Pheochromocytoma: (testing not indicated)  Cushing's syndrome: Check cortisol Coarctation of the aorta:   # OSA:  Continue BiPAP.   # CAD:  # Hyperlipidemia: She previously reported exertional chest pain and has 80% distal LAD disease.  She doesn't tolerate Imdur 2/2 headaches. It seems to have improved with higher doses of Ranexa.  Continue clopidogrel, ranolazine, Zetia, and nebivolol.   # SVT: # PVCs: Continue nebivolol.  Add diltiazem.    Disposition:    F/u in 2 months    Medication Adjustments/Labs and Tests Ordered: Current medicines are reviewed at length with the patient today.  Concerns regarding medicines are outlined above.  Orders Placed This Encounter  Procedures  . Basic metabolic panel  . Cortisol   Meds ordered this encounter  Medications  . diltiazem (CARDIZEM CD) 240 MG 24 hr capsule    Sig: Take 1 capsule (240 mg total) by mouth daily.    Dispense:  90 capsule    Refill:  3    AWARE ON AMLODIPINE     Signed, Skeet Latch, MD  06/16/2020 8:34 AM    [place]

## 2020-06-16 NOTE — Progress Notes (Signed)
Heart and Vascular Care Navigation  06/16/2020  Erica [name] [birth year] [id number]  Reason for Referral:  Grief/mental health resources                                                                                                    Assessment:                           LCSW reached out to pt at request of Rip Harbour, LPN, w/ Dr. Blenda Mounts team. LCSW familiar with pt from previous assistance w/ financial referrals and SNAP assistance. LCSW called pt at [phone]. Re-introduced self, role, reason for call. Shared that I was aware pt had lost a family member recently and that it had caused her to feel her blood pressure was elevated when thinking about it. LCSW gave my condolences and verbal support for pt. Pt is open to counseling/grief support. I had previously sent mental health resources through to pt. She is okay with me sending specific grief support and general mental health resources to her home address which I confirmed again. Reminded her if I could be of any assistance moving forward or if she had any questions related to the resources once received.   HRT/VAS Care Coordination    Patients Home Cardiology Office [place] Team Social Worker   Social Worker Name: Margarito Liner [place], [phone]   Living arrangements for the past 2 months Mobile Home   Lives with: Self   Patient Current Insurance Coverage Managed Medicare   Patient Has Concern With Paying Medical Bills No   Does Patient Have Prescription Coverage? Yes   Home Assistive Devices/Equipment None      Social History:                                                                             SDOH Screenings   Alcohol Screen: Not on file  Depression (KCL2-7): Not on file  Financial Resource Strain: Medium Risk  . Difficulty of Paying Living Expenses: Somewhat hard  Food Insecurity: No Food Insecurity  . Worried About Charity fundraiser in the Last Year: Never  true  . Ran Out of Food in the Last Year: Never true  Housing: High Risk  . Last Housing Risk Score: 2  Physical Activity: Not on file  Social Connections: Not on file  Stress: Stress Concern Present  . Feeling of Stress : Rather much  Tobacco Use: Low Risk   . Smoking Tobacco Use: Never Smoker  . Smokeless Tobacco Use: Never Used  Transportation Needs: No Transportation Needs  . Lack of Transportation (Medical): No  . Lack of Transportation (Non-Medical): No    Other Care Navigation Interventions:     Patient expressed Mental  Health concerns Grief around loss of nephew  Patient Referred to: Provided information about [facility] grief counseling (group and individual),    Follow-up plan:   LCSW has sent information about [facility] alone with [place] resources. I have included my card for pt to contact me again as needed. I will f/u with pt as able to ensure resources received.   Westley Hummer, MSW, [place]  [phone]

## 2020-06-17 LAB — BASIC METABOLIC PANEL
BUN/Creatinine Ratio: 12 (ref 9–23)
BUN: 11 mg/dL (ref 6–24)
CO2: 24 mmol/L (ref 20–29)
Calcium: 9.3 mg/dL (ref 8.7–10.2)
Chloride: 102 mmol/L (ref 96–106)
Creatinine, Ser: 0.9 mg/dL (ref 0.57–1.00)
Glucose: 98 mg/dL (ref 65–99)
Potassium: 3.9 mmol/L (ref 3.5–5.2)
Sodium: 140 mmol/L (ref 134–144)
eGFR: 80 mL/min/{1.73_m2} (ref >59)

## 2020-06-17 LAB — CORTISOL: Cortisol: 5.7 ug/dL

## 2020-06-19 ENCOUNTER — Other Ambulatory Visit: Payer: Self-pay

## 2020-06-19 ENCOUNTER — Ambulatory Visit (AMBULATORY_SURGERY_CENTER): Payer: Self-pay

## 2020-06-19 VITALS — Ht 65.0 in | Wt 207.0 lb

## 2020-06-19 DIAGNOSIS — Z1211 Encounter for screening for malignant neoplasm of colon: Secondary | ICD-10-CM

## 2020-06-19 MED ORDER — PLENVU 140 G PO SOLR
1.0000 | ORAL | 0 refills | Status: DC
Start: 2020-06-19 — End: 2021-06-25

## 2020-06-19 NOTE — Progress Notes (Signed)
No egg or soy allergy known to patient  No issues with past sedation with any surgeries or procedures Patient denies ever being told they had issues or difficulty with intubation  No FH of Malignant Hyperthermia No diet pills per patient No home 02 use per patient  No blood thinners per patient  Pt denies issues with constipation  No A fib or A flutter  EMMI video via MyChart  COVID 19 guidelines implemented in [place] today with Pt and RN  COVID screening scheduled on 07/10/2020  Monday at 8:00 am; Coupon given to pt in PV today , Code to Pharmacy and  NO PA's for preps discussed with pt In PV today  Discussed with pt there will be an out-of-pocket cost for prep and that varies from $0 to 70 dollars  Due to the COVID-19 pandemic we are asking patients to follow certain guidelines.  Pt aware of COVID protocols and [facility] guidelines

## 2020-06-19 NOTE — Progress Notes (Signed)
[facility] PREP Weekly Session   Patient Details  Name: Erica [name] MRN: [id number] Date of Birth: 1975-11-04 Age: 45 y.o. PCP: [name], MD  Vitals:   06/19/20 1000  Weight: 207 lb (93.9 kg)     [facility] Weekly seesion - 06/19/20 1100      Weekly Session   Topic Discussed Expectations and non-scale victories    Classes attended to date [place] 06/19/2020, 12:00 PM

## 2020-06-21 ENCOUNTER — Telehealth: Payer: Self-pay | Admitting: Licensed Clinical Social Worker

## 2020-06-21 NOTE — Telephone Encounter (Signed)
F/u message sent to pt regarding grief counseling resources, LCSW remains available for additional support for any questions/concerns that pt may have.   Westley Hummer, MSW, [place]  [phone]

## 2020-06-22 ENCOUNTER — Other Ambulatory Visit: Payer: Self-pay | Admitting: Internal Medicine

## 2020-06-26 NOTE — Progress Notes (Signed)
[facility] PREP Weekly Session   Patient Details  Name: Erica [name] MRN: [id number] Date of Birth: 11/17/1975 Age: 45 y.o. PCP: [name], MD  Vitals:   06/26/20 1630  Weight: 205 lb 12.8 oz (93.4 kg)     [facility] Weekly seesion - 06/26/20 1600      Weekly Session   Topic Discussed Other   Portion control   Minutes exercised this week 90 minutes    Classes attended to date 60          Brainstormed ideas for meals to increase energy levels. May not be getting enough calories   Barnett Hatter 06/26/2020, 4:31 PM

## 2020-06-30 ENCOUNTER — Other Ambulatory Visit (HOSPITAL_COMMUNITY): Payer: Medicare Other

## 2020-07-02 ENCOUNTER — Encounter (HOSPITAL_BASED_OUTPATIENT_CLINIC_OR_DEPARTMENT_OTHER): Payer: Self-pay

## 2020-07-03 ENCOUNTER — Encounter (HOSPITAL_BASED_OUTPATIENT_CLINIC_OR_DEPARTMENT_OTHER): Payer: Medicare Other | Admitting: Cardiology

## 2020-07-03 MED ORDER — CLOTRIMAZOLE 10 MG MT TROC: 10.0000 mg | Freq: Every day | OROMUCOSAL | 11 refills | Status: DC

## 2020-07-03 MED ORDER — PROMETHAZINE-DM 6.25-15 MG/5ML PO SYRP: 5.0000 mL | ORAL_SOLUTION | Freq: Four times a day (QID) | ORAL | 2 refills | Status: DC | PRN

## 2020-07-03 NOTE — Telephone Encounter (Signed)
Patient sent email wanting refills of thrush medication and cough syrup  I take the cough Syrup for cough because I have a chronic cough and I've used my last refill and the thrush I get every so often from the meds and I don't have anymore refills on that. [facility] [address] [place]  Sending to Dr. Melvyn Novas for recommendations

## 2020-07-03 NOTE — Telephone Encounter (Signed)
Done

## 2020-07-03 NOTE — Progress Notes (Signed)
[facility] PREP Weekly Session   Patient Details  Name: Erica [name] MRN: [id number] Date of Birth: 12-16-1975 Age: 46 y.o. PCP: [name], MD  Vitals:   07/03/20 1000  Weight: 205 lb 11.2 oz (93.3 kg)     [facility] Weekly seesion - 07/03/20 1100      Weekly Session   Topic Discussed Finding support    Minutes exercised this week 120 minutes    Classes attended to date [place] 07/03/2020, 11:37 AM

## 2020-07-04 ENCOUNTER — Encounter (HOSPITAL_BASED_OUTPATIENT_CLINIC_OR_DEPARTMENT_OTHER): Payer: Medicare Other | Admitting: Cardiology

## 2020-07-05 ENCOUNTER — Encounter (HOSPITAL_BASED_OUTPATIENT_CLINIC_OR_DEPARTMENT_OTHER): Payer: Medicare Other | Admitting: Cardiology

## 2020-07-05 MED ORDER — ALBUTEROL SULFATE HFA 108 (90 BASE) MCG/ACT IN AERS: 2.0000 | INHALATION_SPRAY | Freq: Four times a day (QID) | RESPIRATORY_TRACT | 3 refills | Status: DC | PRN

## 2020-07-06 ENCOUNTER — Other Ambulatory Visit: Payer: Self-pay

## 2020-07-06 ENCOUNTER — Inpatient Hospital Stay: Payer: Medicare Other | Attending: Oncology

## 2020-07-06 VITALS — BP 190/114 | HR 88 | Temp 98.0°F | Resp 18

## 2020-07-06 DIAGNOSIS — Z79818 Long term (current) use of other agents affecting estrogen receptors and estrogen levels: Secondary | ICD-10-CM | POA: Diagnosis present

## 2020-07-06 DIAGNOSIS — C50412 Malignant neoplasm of upper-outer quadrant of left female breast: Secondary | ICD-10-CM | POA: Diagnosis present

## 2020-07-06 MED ORDER — GOSERELIN ACETATE 3.6 MG ~~LOC~~ IMPL
3.6000 mg | DRUG_IMPLANT | SUBCUTANEOUS | Status: DC
  Administered 2020-07-06: 3.6 mg via SUBCUTANEOUS

## 2020-07-06 MED ORDER — GOSERELIN ACETATE 3.6 MG ~~LOC~~ IMPL
DRUG_IMPLANT | SUBCUTANEOUS | Status: AC
  Filled 2020-07-06: qty 3.6

## 2020-07-06 NOTE — Patient Instructions (Signed)

## 2020-07-06 NOTE — Progress Notes (Signed)
Attempted to obtain medical history via telephone, unable to reach at this time. I left a voicemail to return pre surgical testing department's phone call.  

## 2020-07-06 NOTE — Progress Notes (Signed)
Spoke with Dr. [name] regarding the pt's bp of 190/114. He gave the ok to give the pt her zoladex for today. Also informed pt to take her bp medication when she gets home.

## 2020-07-08 ENCOUNTER — Inpatient Hospital Stay (HOSPITAL_COMMUNITY): Admission: RE | Admit: 2020-07-08 | Payer: Medicare Other | Source: Ambulatory Visit

## 2020-07-10 ENCOUNTER — Other Ambulatory Visit (HOSPITAL_COMMUNITY): Payer: Medicare Other

## 2020-07-10 NOTE — Progress Notes (Signed)
[facility] PREP Weekly Session   Patient Details  Name: KALYNN DECLERCQ MRN: [id number] Date of Birth: 14-May-1975 Age: 45 y.o. PCP: [name], MD  Vitals:   07/10/20 1151  Weight: 206 lb 3.2 oz (93.5 kg)     [facility] Weekly seesion - 07/10/20 1100      Weekly Session   Topic Discussed Calorie breakdown    Minutes exercised this week 90 minutes    Classes attended to date [place] 07/10/2020, 11:53 AM

## 2020-07-11 ENCOUNTER — Encounter (HOSPITAL_COMMUNITY): Payer: Self-pay | Admitting: Certified Registered Nurse Anesthetist

## 2020-07-12 ENCOUNTER — Ambulatory Visit (HOSPITAL_COMMUNITY): Admission: RE | Admit: 2020-07-12 | Payer: Medicare Other | Source: Home / Self Care | Admitting: Internal Medicine

## 2020-07-12 ENCOUNTER — Encounter (HOSPITAL_COMMUNITY): Admission: RE | Payer: Self-pay | Source: Home / Self Care

## 2020-07-12 SURGERY — COLONOSCOPY WITH PROPOFOL
Anesthesia: Monitor Anesthesia Care

## 2020-07-17 NOTE — Progress Notes (Signed)
[facility] PREP Weekly Session   Patient Details  Name: Erica [name] MRN: [id number] Date of Birth: [birth year] Age: 45 y.o. PCP: [name], MD  Vitals:   07/17/20 1201  Weight: 206 lb (93.4 kg)     [facility] Weekly seesion - 07/17/20 1200      Weekly Session   Topic Discussed Hitting roadblocks    Minutes exercised this week 90 minutes    Classes attended to date 34          Still having SOB and inc HR with exercise. Has to stop.  Reviewed some of meds with her. Does have hx of asthma uses inhalers.  Encouraged to keep levels low and to help with HR  Struggling with endurance.  Will send message to MDs for advice on how to improve.     Barnett Hatter 07/17/2020, 12:02 PM

## 2020-07-19 NOTE — Progress Notes (Signed)
Still experiencing high HR and sob with exercise Observed this week and suggested a recumbant bike at low level to check for endurance. Able to work about 10 min before needing to stop. Likes the stair climber and bikes the best but is frustrated that she cannot work out for long.  Direct observation with exercise today.  Was able to do 5 min on the stair climber before needing to stop. HR at start 111 pulse ox 99 (2L of O2) ,  went to 160 and pulse ox 99 after 5 min. Experiences 'heart hurting' at 7 Talked to her about max HR for her age is 8 and 85% of max is 147,  and keeping her in a lower level and intensity--HR max at 140's   Switched to strength training and is able to tolerate the strength training much better keeping her HR below 120's and O2 stable at 99  No return of 'heart hurting' Talked about diet and keeping up with good nutrition for fuel, encouraged adding plant proteins, keeping hydration up as well to combat fatigue. Takes B and D vitamins  Talked about plans for exercise:  10 min of cardio to warm up  20-40 min of strength training Then 5-10 min cardio to finish  Followed by stretching  Will update referring provider  Due to finish PREP 07/31/20

## 2020-07-24 NOTE — Progress Notes (Signed)
[facility] PREP Weekly Session   Patient Details  Name: Erica [name] MRN: [id number] Date of Birth: 1975/06/28 Age: 45 y.o. PCP: [name], MD  Vitals:   07/24/20 1142  Weight: 204 lb (92.5 kg)     [facility] Weekly seesion - 07/24/20 1100      Weekly Session   Topic Discussed Other   How fit and strong you are survey completed   Minutes exercised this week 120 minutes    Classes attended to date [place] 07/24/2020, 11:43 AM

## 2020-07-29 ENCOUNTER — Other Ambulatory Visit: Payer: Self-pay | Admitting: Neurology

## 2020-08-01 NOTE — Progress Notes (Signed)
[place] Report   Patient Details  Name: Erica [name] MRN: [id number] Date of Birth: [birth year] Age: 45 y.o. PCP: [name], MD  Vitals:   07/31/20 1000  BP: (!) 176/100  Pulse: 82  Weight: 207 lb 3.2 oz (94 kg)      [facility] Eval - 08/01/20 1100      Measurement   Waist Circumference 45.5 inches    Hip Circumference 46 inches    Body fat 36.4 percent      Information for Trainer   Goals 15 minutes cardio, weights, 15 minutes cardio for exercise      Mobility and Daily Activities   I find it easy to walk up or down two or more flights of stairs. 4    I have no trouble taking out the trash. 3    I do housework such as vacuuming and dusting on my own without difficulty. 1    I can easily lift a gallon of milk (8lbs). 3    I can easily walk a mile. 1    I have no trouble reaching into high cupboards or reaching down to pick up something from the floor. 4    I do not have trouble doing out-door work such as Armed forces logistics/support/administrative officer, raking leaves, or gardening. 1      Mobility and Daily Activities   I feel younger than my age. 1    I feel independent. 3    I feel energetic. 1    I live an active life.  2    I feel strong. 4    I feel healthy. 1    I feel active as other people my age. 1      How fit and strong are you.   Fit and Strong Total Score 30          Past Medical History:  Diagnosis Date  . Allergy   . Anxiety   . Apnea, sleep 05/04/2014   AHI 31/hr now on CPAP at 12cm H2O  . Arthritis   . Asthma   . Asthma without status asthmaticus 01/19/2016  . Axillary hidradenitis suppurativa 08/01/2011  . Blood transfusion without reported diagnosis   . Breast cancer ([place]) 2010   a. locally advanced left breast carcinoma diagnosed in 2010 and treated with neoadjuvant chemotherapy with docetaxel and cyclophosphamide as well as paclitaxel. This was followed by radiation therapy which was completed in 2011.  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 CHF (congestive heart failure) ([place])   . Chronic diastolic CHF (congestive heart failure) ([place]) 06/23/2013  . Chronic kidney disease   . Chronic pain in left foot 06/29/2018  . Cough variant asthma 09/27/2010   Followed in Pulmonary clinic/ [facility]/ [place]  Onset reported in childhood  - PFT's 01/31/2011 FEV1  1.76 (60% ) ratio 68 and no better p B2,  DLCO 70% -med review 09/25/2012 > [place] drug assistance  paperwork given    - HFA 90% 11/20/2012 .  -Med calendar 12/07/12 , 04/06/2013 , 04/18/2014,, 08/30/2014 , 11/08/2014  - PFT's 01/12/2013  1.73 (65%)  Ratio 73 and no better p B2, DLCO 86% - trial off   . 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 Dyspnea on exertion    a. 01/2013 Lexi MV: EF 54%, no ischemia/infarct;  b. 05/2013 Echo: EF 60-65%, no rwma, Gr 2 DD;  b.   . Essential hypertension 10/09/2014   Estimated Creatinine Clearance: 82.4 mL/min (  by C-G formula based on SCr of 0.98 mg/dL).  . Fatty liver   . Gastroparesis   . GERD (gastroesophageal reflux disease)   . Hyperglycemia 04/13/2011  . Hypertension   . Hypertensive heart disease 09/26/2015  . Hypertriglyceridemia without hypercholesterolemia 06/15/2015   Framingham risk score is 1%   . Idiopathic intracranial hypertension 10/21/2014  . Impaired glucose tolerance 04/13/2011  . Insomnia secondary to anxiety 04/05/2015  . Lymphadenitis, chronic    restricted LEFT extremity  . Malignant neoplasm of upper-outer quadrant of left breast in female, estrogen receptor negative ([place]) 01/07/2013   Stage III, Invasive Ductal, s/p partial Left Mastectomy/Lumpectomy (baseball sized) with Lymph Node dissection, had Chemo and RadRx    . 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 OSA (obstructive sleep apnea) 05/04/2014   AHI 31/hr now on CPAP at 12cm H2O  . Oxygen deficiency    Uses 02 in CPAP  . Persistent proteinuria 03/31/2018  . Personal history of chemotherapy 2010  . Personal history of radiation therapy 2010  .  Sleep apnea   . Tibialis posterior tendinitis, left 07/15/2018   Injected in March 10, 2019  . Upper airway cough syndrome 10/09/2014   Onset around 2012 with assoc pseudowheeze MRI 05/18/15 > Paranasal sinuses are clear - 11/17/2015 rec increase h1 to 2 hs to help with noct cough  - Allergy profile 11/17/2015 >  Eos 0.1 /  IgE  80 POS RAST  to grass, ragweed, cedar tree  - d/c acei 03/06/2018  - flare end of Jan 2020 in setting of ? Uri/ poorly controlled GERD - flare mid Oct 2020 with uri / assoc secondary gerd  . Vitamin D deficiency disease 03/31/2018   Past Surgical History:  Procedure Laterality Date  . BRAIN SURGERY     [place]  . BREAST LUMPECTOMY Left 09/2008  . CARPAL TUNNEL RELEASE Left 2011  . LYMPH NODE DISSECTION Left 2010   breast; 2 wks after breast lumpectomy  . RIGHT/LEFT HEART CATH AND CORONARY ANGIOGRAPHY N/A 05/04/2019   Procedure: RIGHT/LEFT HEART CATH AND CORONARY ANGIOGRAPHY;  Surgeon: Jolaine Artist, MD;  Location: [facility];  Service: Cardiovascular;  Laterality: N/A;  Renal injection preformed   . TENDON RELEASE Right 2012   "hand"  . TUBAL LIGATION  05/11/2011   Procedure: POST PARTUM TUBAL LIGATION;  Surgeon: Emeterio Reeve, MD;  Location: [facility];  Service: Gynecology;  Laterality: Bilateral;  Induced for HTN   Social History   Tobacco Use  Smoking Status Never Smoker  Smokeless Tobacco Never Used        Kayden Amend B Dedrick Heffner 08/01/2020, 12:23 PM

## 2020-08-02 NOTE — Progress Notes (Signed)
I, [name], LAT, ATC, am serving as scribe for Dr. Lynne Leader.  Erica [name] is a 45 y.o. female who presents to [place] [facility] today for B ankle and R arm pain.  She was last seen by [name] on 12/02/19 for f/u of B foot pain and swelling, L>R.  She was shown [place]'s exercises and was advised to use ice massage and heel cups.  Since her last visit, pt reports that she fell on Sunday when she fell going up the stairs and fell on her R side, landing on her R hip and R arm.  She locates her ankle pain to her L lateral ankle and lower leg and R ant ankle.  She is also having a sensation of her R arm "being asleep" since the fall and is having some R forearm pain.  Diagnostic imaging: L foot XR- 07/01/18  Pertinent review of systems: No fevers or chills  Relevant historical information: Currently receiving estrogen depleting therapy for breast cancer.  Difficult to control hypertension.   Exam:  BP (!) 190/108 (BP Location: Right Arm, Patient Position: Sitting, Cuff Size: Normal)   Pulse 75   Ht 5\' 5"  (1.651 m)   Wt 206 lb 6.4 oz (93.6 kg)   SpO2 96%   BMI 34.35 kg/m  General: Well Developed, well nourished, and in no acute distress.   MSK:  C-spine normal-appearing nontender midline.  Normal cervical motion.  Positive right-sided Spurling's test. Upper extremity strength reflexes and sensation are equal normal throughout.  Right elbow normal.  Nontender normal motion negative Tinel's cubital tunnel. Right wrist normal.  Normal motion normal strength mildly positive Tinel's carpal tunnel.  Right leg Normal-appearing Nontender knee normal knee motion. Right ankle normal-appearing Tender palpation lateral malleolus and ATFL area.  Tender palpation proximal fifth metatarsal. Normal ankle motion.  Pain with resisted foot Eversion. Stable ligamentous exam. Pulses cap refill and sensation are intact distally.  Left leg Normal. Nontender knee  normal knee motion. Left ankle normal-appearing Nontender Normal motion. Stable ligamentous exam. Intact strength.    Lab and Radiology Results  X-ray images right foot and ankle obtained today personally and independently interpreted  Right ankle: No acute fractures.  Mild degenerative changes medially.  Right foot: Probable old distal end of proximal fifth phalanx fracture.  Does not appear to be acute.  No significant abnormalities at proximal fifth metatarsal  Await formal radiology review    Assessment and Plan: 45 y.o. female with  Fall with right arm paresthesias. Multifactorial.  Patient has evidence of right C8 radiculopathy and right carpal tunnel syndrome.  The symptoms are minor without affecting strength.  Plan to treat with increasing gabapentin dose up to 900 mg 3 times a day.  Backup course of prednisone prescribed.  However main treatment will be physical therapy and carpal tunnel brace.  Recheck in 1 month.  Right ankle sprain: Patient is tender at the lateral malleolus and proximal fifth metatarsal so we will go ahead and proceed with an x-ray of these areas.  She is at risk for decreased bone mineral density given chemotherapy and low vitamin D history.  Main treatment will be ankle brace and physical therapy  Left ankle sprain much less severe.  Not point tender over bone therefore lateral ankle x-ray negative.  Avoid x-rays for now.  Physical therapy as well.  Recheck 1 month.   PDMP not reviewed this encounter. Orders Placed This Encounter  Procedures  . DG  Foot Complete Right    Standing Status:   Future    Standing Expiration Date:   08/03/2021    Order Specific Question:   Reason for Exam (SYMPTOM  OR DIAGNOSIS REQUIRED)    Answer:   ankle pain    Order Specific Question:   Is patient pregnant?    Answer:   No    Order Specific Question:   Preferred imaging location?    Answer:   [facility]  . DG Ankle Complete Right    Standing Status:    Future    Standing Expiration Date:   08/03/2021    Order Specific Question:   Reason for Exam (SYMPTOM  OR DIAGNOSIS REQUIRED)    Answer:   ankle pain    Order Specific Question:   Is patient pregnant?    Answer:   No    Order Specific Question:   Preferred imaging location?    Answer:   [facility]  . Ambulatory referral to Physical Therapy    Referral Priority:   Routine    Referral Type:   Physical Medicine    Referral Reason:   Specialty Services Required    Requested Specialty:   Physical Therapy   Meds ordered this encounter  Medications  . AMBULATORY NON FORMULARY MEDICATION    Sig: Carpal Tunnel Wrist brace. Rt  Carpal Tunnel Syndrome G56.01  Disp 1    Dispense:  1 each    Refill:  0  . AMBULATORY NON FORMULARY MEDICATION    Sig: Lace up ankle brace (ASO type)  Ankle sprain S93.491A  Disp 1    Dispense:  1 each    Refill:  0  . predniSONE (DELTASONE) 50 MG tablet    Sig: Take 1 tablet (50 mg total) by mouth daily.    Dispense:  5 tablet    Refill:  0     Discussed warning signs or symptoms. Please see discharge instructions. Patient expresses understanding.   The above documentation has been reviewed and is accurate and complete Lynne Leader, M.D.

## 2020-08-03 ENCOUNTER — Ambulatory Visit (INDEPENDENT_AMBULATORY_CARE_PROVIDER_SITE_OTHER): Payer: Medicare Other | Admitting: Family Medicine

## 2020-08-03 ENCOUNTER — Inpatient Hospital Stay: Payer: Medicare Other | Attending: Oncology

## 2020-08-03 ENCOUNTER — Other Ambulatory Visit: Payer: Self-pay

## 2020-08-03 ENCOUNTER — Ambulatory Visit: Payer: Self-pay

## 2020-08-03 ENCOUNTER — Encounter: Payer: Self-pay | Admitting: Family Medicine

## 2020-08-03 ENCOUNTER — Ambulatory Visit (INDEPENDENT_AMBULATORY_CARE_PROVIDER_SITE_OTHER): Payer: Medicare Other

## 2020-08-03 VITALS — BP 190/108 | HR 75 | Ht 65.0 in | Wt 206.4 lb

## 2020-08-03 VITALS — BP 191/99 | HR 86 | Temp 98.9°F | Resp 18

## 2020-08-03 DIAGNOSIS — S93491A Sprain of other ligament of right ankle, initial encounter: Secondary | ICD-10-CM

## 2020-08-03 DIAGNOSIS — C50412 Malignant neoplasm of upper-outer quadrant of left female breast: Secondary | ICD-10-CM | POA: Diagnosis present

## 2020-08-03 DIAGNOSIS — M25572 Pain in left ankle and joints of left foot: Secondary | ICD-10-CM

## 2020-08-03 DIAGNOSIS — G5601 Carpal tunnel syndrome, right upper limb: Secondary | ICD-10-CM

## 2020-08-03 DIAGNOSIS — Z79818 Long term (current) use of other agents affecting estrogen receptors and estrogen levels: Secondary | ICD-10-CM | POA: Diagnosis present

## 2020-08-03 DIAGNOSIS — M25571 Pain in right ankle and joints of right foot: Secondary | ICD-10-CM

## 2020-08-03 DIAGNOSIS — M5412 Radiculopathy, cervical region: Secondary | ICD-10-CM

## 2020-08-03 MED ORDER — PREDNISONE 50 MG PO TABS: 50.0000 mg | ORAL_TABLET | Freq: Every day | ORAL | 0 refills | Status: DC

## 2020-08-03 MED ORDER — GOSERELIN ACETATE 3.6 MG ~~LOC~~ IMPL
DRUG_IMPLANT | SUBCUTANEOUS | Status: AC
  Filled 2020-08-03: qty 3.6

## 2020-08-03 MED ORDER — GOSERELIN ACETATE 3.6 MG ~~LOC~~ IMPL
3.6000 mg | DRUG_IMPLANT | SUBCUTANEOUS | Status: DC
  Administered 2020-08-03: 3.6 mg via SUBCUTANEOUS

## 2020-08-03 MED ORDER — AMBULATORY NON FORMULARY MEDICATION: 0 refills | Status: AC

## 2020-08-03 NOTE — Patient Instructions (Addendum)
Thank you for coming in today.  Please get an Xray today before you leave  Ok to increase gabapentin up to 900mg  3x daily.   Please get an Xray today before you leave  I've referred you to Physical Therapy.  Let us know if you don't hear from them in one week.  Take the prednisone if needed.   Recheck in 1 month  Please go to [facility] supply to get the ankle brace and carpal tunnel brace we talked about today. You may also be able to get it from [employer].

## 2020-08-03 NOTE — Patient Instructions (Signed)

## 2020-08-07 NOTE — Progress Notes (Signed)
Right ankle x-ray looks normal to radiology

## 2020-08-07 NOTE — Progress Notes (Signed)
Right foot x-ray looks normal to radiology

## 2020-08-10 NOTE — Telephone Encounter (Signed)
Patient has been seen in office since message

## 2020-08-11 ENCOUNTER — Encounter: Payer: Self-pay | Admitting: Family Medicine

## 2020-08-21 NOTE — Progress Notes (Signed)
Advanced Hypertension Clinic Follow up:    Date:  08/22/2020   ID:  Erica [name], DOB [birth year], MRN [id number]  PCP:  [name], MD  Cardiologist:  None  Nephrologist:  Referring MD: [name], MD   CC: Hypertension  History of Present Illness:    Erica [name] is a 45 y.o. female with a hx of resistant hypertension, chronic diastolic heart failure, breast cancer s/p chemo and XRT, morbid obesity and OSA intolerant of CPAP here to establish care in the hypertension clinic. She reports that her BP has been elevated since she was treated for breast cancer in 2010.  Since then her blood pressure has never been very well have been controlled.  She has been on multiple antihypertensives and her blood pressure remains in the 884Z to 660Y systolic.  She struggles with exertional dyspnea that makes it hard for her to walk even short distances.  Echo 03/2018 revealed LVEF 45 to 50% with grade 2 diastolic dysfunction.  There was mild MR.  PASP was 33.  Erica [name] has a history of recurrent syncope.  She had an episode that occurred while wearing an ambulatory monitor and the episode was felt to be due to narcolepsy.  She has sleep apnea but is not tolerant of her CPAP machine.  She now uses her BIPAP machine but struggles with insomnia.  She wakes up hourly.  She underwent left and right heart catheterization on 05/04/2019.  She was found to have 60% distal left circumflex and 80% distal LAD disease.  Given her aspirin allergy Dr. Meda Coffee recommended that she start clopidogrel.  Right atrial pressure was 10, and PA pressure was 26/10.  PCWP 18.  LVEDP was 23 mmHg.  At her first hypertension clinic visit there were concerns about whether she was taking her medication as prescribed.  Discrepancies between what was ordered and what she was actually picking up from the pharmacy.  She continues to report taking all medications as prescribed.  Her blood pressures continue to run quite elevated.  At  her initial assessment doxazosin was added to her regimen.  This does not seem to have helped much.  She followed up with our pharmacist and was started on amlodipine.  She is unable to exercise due to poorly controlled blood pressure.  She has 1 caffeinated drink daily and does not use over-the-counter supplements or medications.  She continues to try to minimize her sodium intake.  Renal artery Dopplers revealed 1- 59% stenosis bilaterally in 2019.  CT-A of the abdomen showed a 1.3 cm renal cyst but no adrenal adenomas.  There was no evidence of renal artery stenosis.  Erica [name] saw our pharmacist on 12/2019.  She was in a lot of pain at the time.  Given her high number or medications and her pain, her medications were not changed.  She has struggled with LE edema.  LE Doppler was negative for DVT 10/2019.  She was given metolazone to take prior to torsemide.  For the last two months she has been more swollen but it is finally better.  Her weight has decreased from 211 lb to 187 lb.  Erica [name] continues to struggle with shortness of breath. Her edema resolved but there was no improvement in her breathing.  She uses her CPAP every other night.  She notes that there is a recall on her CPAP and she isn't supposed to be using it.  However, she can't sleep well without it. Lately  her BP at home has been in the 150-200s.  She notes that when she has pain it is higher.  She has a lot of joint pain, especially in her feel.    Erica [name] wanted to reduce her medications.  However her blood pressure was significantly elevated.  We reduced hydralazine to 50 mg 3 times a day and increase doxazosin to 8 mg twice daily in an effort to try and get her off of her 3 times daily medications and improve compliance.  Ranexa was increased due to chest pain.  This seems to have helped her chest pain.  It seems to occur randomly both at rest and with exertion.  Her clonidine was increased to 0.6 mg every 24 hours. She wore an   ambulatory monitor that showed PVCs and up to 8 beats of SVT.  The episodes seem to occur mostly in the early a.m.  She started going to the PREP program at the [facility].  She notes that her heart rate goes up quickly.  It makes her feel exhausted.  She takes her rest when she needs to and start back exercising.  She has lower extremity edema by the end of the day but no orthopnea or PND.  She did get a new CPAP machine and struggles to use it because of claustrophobia.  However she does use it every night.   Today, she is feeling okay overall. At home her blood pressure averages 150s-160s, however she is now taking prednisone for her ankle and right hand pain. She recently tripped and fell, and is wearing a cast on her right wrist. At clinic today her heart rate is in the 50s, but she is not experiencing any symptoms. She continues to exercise. Typically, about 2 minutes into her exercise, she notices her heart rate elevates to the 160s, and she can feel when this occurs. She is not noticing any gains in stamina. However, she continues to push herself, yesterday she went 10 minutes on the stair-climbing machine. Also, her weight has increased slightly and she feels like she failed. Across her upper chest and upper arms, she will feel chest heaviness and some pain. When she takes Imdur she gets a horrible headache, and she is unsure if the chest heaviness correlates to the medication as well. If she remains seated for a prolonged time, she will develop LE edema. She denies any palpitations, lightheadedness, or syncope. Also has no orthopnea or PND.   Past Medical History:  Diagnosis Date  . Allergy   . Anxiety   . Apnea, sleep 05/04/2014   AHI 31/hr now on CPAP at 12cm H2O  . Arthritis   . Asthma   . Asthma without status asthmaticus 01/19/2016  . Axillary hidradenitis suppurativa 08/01/2011  . Blood transfusion without reported diagnosis   . Breast cancer ([place]) 2010   a. locally advanced left breast  carcinoma diagnosed in 2010 and treated with neoadjuvant chemotherapy with docetaxel and cyclophosphamide as well as paclitaxel. This was followed by radiation therapy which was completed in 2011.  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 CHF (congestive heart failure) ([place])   . Chronic diastolic CHF (congestive heart failure) ([place]) 06/23/2013  . Chronic kidney disease   . Chronic pain in left foot 06/29/2018  . Chronic stable angina ([place]) 08/22/2020  . Cough variant asthma 09/27/2010   Followed in Pulmonary clinic/ [facility]/ [facility]  Onset reported in childhood  - PFT's 01/31/2011 FEV1  1.76 (60% ) ratio 68  and no better p B2,  DLCO 70% -med review 09/25/2012 > [place] drug assistance  paperwork given    - HFA 90% 11/20/2012 .  -Med calendar 12/07/12 , 04/06/2013 , 04/18/2014,, 08/30/2014 , 11/08/2014  - PFT's 01/12/2013  1.73 (65%)  Ratio 73 and no better p B2, DLCO 86% - trial off   . 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 Dyspnea on exertion    a. 01/2013 Lexi MV: EF 54%, no ischemia/infarct;  b. 05/2013 Echo: EF 60-65%, no rwma, Gr 2 DD;  b.   . Essential hypertension 10/09/2014   Estimated Creatinine Clearance: 82.4 mL/min (by C-G formula based on SCr of 0.98 mg/dL).  . Fatty liver   . Gastroparesis   . GERD (gastroesophageal reflux disease)   . Hyperglycemia 04/13/2011  . Hypertension   . Hypertensive heart disease 09/26/2015  . Hypertriglyceridemia without hypercholesterolemia 06/15/2015   Framingham risk score is 1%   . Idiopathic intracranial hypertension 10/21/2014  . Impaired glucose tolerance 04/13/2011  . Insomnia secondary to anxiety 04/05/2015  . Lymphadenitis, chronic    restricted LEFT extremity  . Malignant neoplasm of upper-outer quadrant of left breast in female, estrogen receptor negative ([place]) 01/07/2013   Stage III, Invasive Ductal, s/p partial Left Mastectomy/Lumpectomy (baseball sized) with Lymph Node dissection, had Chemo and RadRx    . Neuropathy due to  drug ([place]) 09/27/2010  . OSA (obstructive sleep apnea) 05/04/2014   AHI 31/hr now on CPAP at 12cm H2O  . Oxygen deficiency    Uses 02 in CPAP  . Persistent proteinuria 03/31/2018  . Personal history of chemotherapy 2010  . Personal history of radiation therapy 2010  . Sleep apnea   . SVT (supraventricular tachycardia) ([place]) 08/22/2020  . Tibialis posterior tendinitis, left 07/15/2018   Injected in March 10, 2019  . Upper airway cough syndrome 10/09/2014   Onset around 2012 with assoc pseudowheeze MRI 05/18/15 > Paranasal sinuses are clear - 11/17/2015 rec increase h1 to 2 hs to help with noct cough  - Allergy profile 11/17/2015 >  Eos 0.1 /  IgE  80 POS RAST  to grass, ragweed, cedar tree  - d/c acei 03/06/2018  - flare end of Jan 2020 in setting of ? Uri/ poorly controlled GERD - flare mid Oct 2020 with uri / assoc secondary gerd  . Vitamin D deficiency disease 03/31/2018    Past Surgical History:  Procedure Laterality Date  . BRAIN SURGERY     [place]  . BREAST LUMPECTOMY Left 09/2008  . CARPAL TUNNEL RELEASE Left 2011  . LYMPH NODE DISSECTION Left 2010   breast; 2 wks after breast lumpectomy  . RIGHT/LEFT HEART CATH AND CORONARY ANGIOGRAPHY N/A 05/04/2019   Procedure: RIGHT/LEFT HEART CATH AND CORONARY ANGIOGRAPHY;  Surgeon: Jolaine Artist, MD;  Location: [facility];  Service: Cardiovascular;  Laterality: N/A;  Renal injection preformed   . TENDON RELEASE Right 2012   "hand"  . TUBAL LIGATION  05/11/2011   Procedure: POST PARTUM TUBAL LIGATION;  Surgeon: Emeterio Reeve, MD;  Location: [facility];  Service: Gynecology;  Laterality: Bilateral;  Induced for HTN    Current Medications: Current Meds  Medication Sig  . acetaZOLAMIDE (DIAMOX) 250 MG tablet Take 4 tablets (1,000 mg total) by mouth 3 (three) times daily.  Marland Kitchen albuterol (PROVENTIL) (2.5 MG/3ML) 0.083% nebulizer solution USE 1 VIAL VIA NEBULIZER EVERY 4 HOURS AND AS NEEDED FOR WHEEZING OR SHORTNESS OF BREATH  . albuterol  (VENTOLIN HFA) 108 (90 Base) MCG/ACT  inhaler Inhale 2 puffs into the lungs every 6 (six) hours as needed for wheezing or shortness of breath.  . AMBULATORY NON FORMULARY MEDICATION Carpal Tunnel Wrist brace. Rt  Carpal Tunnel Syndrome G56.01  Disp 1  . AMBULATORY NON FORMULARY MEDICATION Lace up ankle brace (ASO type)  Ankle sprain S93.491A  Disp 1  . amitriptyline (ELAVIL) 100 MG tablet TAKE 1 TABLET BY MOUTH EVERYDAY AT BEDTIME  . Biotin 5000 MCG TABS Take 5,000 mg by mouth 2 (two) times daily.   . budesonide-formoterol (SYMBICORT) 160-4.5 MCG/ACT inhaler INHALE 2 PUFFS INTO LUNGS 2 TIMES DAILY AS DIRECTED  . Cholecalciferol 1.25 MG (50000 UT) capsule Take 1 capsule (50,000 Units total) by mouth once a week.  . cloNIDine (CATAPRES - DOSED IN MG/24 HR) 0.3 mg/24hr patch PLACE 2 PATCHES ONTO SKIN ONCE WEEKLY  . clopidogrel (PLAVIX) 75 MG tablet Take 1 tablet (75 mg total) by mouth daily.  . clotrimazole (MYCELEX) 10 MG troche Take 1 tablet (10 mg total) by mouth 5 (five) times daily.  Marland Kitchen dexlansoprazole (DEXILANT) 60 MG capsule Take 1 capsule (60 mg total) by mouth daily.  Marland Kitchen dextromethorphan-guaiFENesin (MUCINEX DM) 30-600 MG 12hr tablet Take 2 tablets by mouth 2 (two) times daily.  Marland Kitchen diltiazem (CARDIZEM CD) 240 MG 24 hr capsule Take 1 capsule (240 mg total) by mouth daily.  Marland Kitchen doxazosin (CARDURA) 8 MG tablet Take 1 tablet (8 mg total) by mouth 2 (two) times daily.  . DULoxetine (CYMBALTA) 30 MG capsule TAKE 1 CAPSULE (30 MG TOTAL) BY MOUTH DAILY. WITH 60MG FOR TOTAL OF 90 MG  . DULoxetine (CYMBALTA) 60 MG capsule TAKE 1 CAPSULE (60 MG TOTAL) BY MOUTH DAILY. FOR TOTAL OF 90 MG  . EPINEPHRINE 0.3 mg/0.3 mL IJ SOAJ injection INJECT 0.3 MLS INTO THE MUSCLE ONCE AS NEEDED FOR ANAPHYLAXIS  . ezetimibe (ZETIA) 10 MG tablet Take 1 tablet (10 mg total) by mouth daily.  . famotidine (PEPCID) 20 MG tablet Take 1-2 tablets (20-40 mg total) by mouth at bedtime.  . gabapentin (NEURONTIN) 300 MG capsule  Take 1 capsule (300 mg total) by mouth 3 (three) times daily. One three times daily  . lidocaine-prilocaine (EMLA) cream Apply 1 application topically every 28 (twenty-eight) days. Apply 1 application to skin ever 28 days  . metoCLOPramide (REGLAN) 10 MG tablet Take 1 tablet (10 mg total) by mouth 4 (four) times daily.  . minoxidil (LONITEN) 2.5 MG tablet Take 2 tablets (5 mg total) by mouth 2 (two) times daily.  . montelukast (SINGULAIR) 10 MG tablet TAKE 1 TABLET BY MOUTH EVERYDAY AT BEDTIME  . Nebivolol HCl 20 MG TABS Take 20 mg by mouth as directed. TAKE 2 TABLETS AT BEDTIME  . NITROSTAT 0.4 MG SL tablet PLACE 1 TABLET UNDER TONGUE EVERY 5 MINUTES AS NEEDED FOR CHEST PAIN, MAX OF 3 TABLETS THEN CALL 911 IF PAIN PERSISTS  . oxymetazoline (MUCINEX SINUS-MAX FULL FORCE) 0.05 % nasal spray Place 1 spray into both nostrils 2 (two) times daily.  Marland Kitchen PEG-KCl-NaCl-NaSulf-Na Asc-C (PLENVU) 140 g SOLR Take 1 kit by mouth as directed. Manufacturer's coupon Universal coupon code:BIN: P2366821; GROUP: [id number]; PCN: [id number]; ID: [id number]; PAY NO MORE $50; NO prior authorization  . Potassium Chloride ER 20 MEQ TBCR TAKE 1 TABLET BY MOUTH TWICE A DAY  . primidone (MYSOLINE) 50 MG tablet Take 0.5 tablets (25 mg total) by mouth at bedtime.  . promethazine (PHENERGAN) 25 MG suppository Place 25 mg rectally every 6 (six) hours as needed for  nausea or vomiting.   . promethazine (PHENERGAN) 25 MG tablet Take 1 tablet (25 mg total) by mouth 3 (three) times daily as needed for nausea or vomiting.  . promethazine-dextromethorphan (PROMETHAZINE-DM) 6.25-15 MG/5ML syrup Take 5 mLs by mouth 4 (four) times daily as needed for cough.  . ranolazine (RANEXA) 1000 MG SR tablet Take 1 tablet (1,000 mg total) by mouth 2 (two) times daily.  Marland Kitchen Respiratory Therapy Supplies (FLUTTER) DEVI Use as directed  . rosuvastatin (CRESTOR) 40 MG tablet Take 1 tablet (40 mg total) by mouth daily.  . sacubitril-valsartan (ENTRESTO) 97-103 MG Take  1 tablet by mouth 2 (two) times daily.  Marland Kitchen Spacer/Aero-Holding Chambers (AEROCHAMBER MV) inhaler Use as instructed  . spironolactone (ALDACTONE) 100 MG tablet TAKE 1 TABLET BY MOUTH EVERY DAY  . torsemide (DEMADEX) 20 MG tablet Take 1 tablet (20 mg total) by mouth 2 (two) times daily.     Allergies:   Aspirin, Doxycycline, Kiwi extract, Metoprolol, Penicillins, Strawberry extract, and Sulfamethoxazole-trimethoprim   Social History   Socioeconomic History  . Marital status: Single    Spouse name: Not on file  . Number of children: 3  . Years of education: Not on file  . Highest education level: Not on file  Occupational History    Employer: UNEMPLOYED  Tobacco Use  . Smoking status: Never Smoker  . Smokeless tobacco: Never Used  Vaping Use  . Vaping Use: Never used  Substance and Sexual Activity  . Alcohol use: Yes    Alcohol/week: 0.0 standard drinks    Comment: occasionally/socially  . Drug use: No  . Sexual activity: Yes    Birth control/protection: Surgical  Other Topics Concern  . Not on file  Social History Narrative   She lives with four children.   She is currently not working (disbaility pending).   Left handed   One story home   Drinks caffeine      Social Determinants of Health   Financial Resource Strain: Medium Risk  . Difficulty of Paying Living Expenses: Somewhat hard  Food Insecurity: No Food Insecurity  . Worried About Charity fundraiser in the Last Year: Never true  . Ran Out of Food in the Last Year: Never true  Transportation Needs: No Transportation Needs  . Lack of Transportation (Medical): No  . Lack of Transportation (Non-Medical): No  Physical Activity: Not on file  Stress: Stress Concern Present  . Feeling of Stress : Rather much  Social Connections: Not on file     Family History: The patient's family history includes Breast cancer in some other family members; CAD in her maternal grandmother; Diabetes in her maternal grandmother;  Fibroids (age of onset: 19) in her sister; Heart disease in her maternal grandmother; Hypertension in her maternal grandmother; Other (age of onset: 79) in her mother. There is no history of Cancer, Alcohol abuse, Early death, Hyperlipidemia, Kidney disease, Stroke, Colon cancer, Esophageal cancer, Stomach cancer, Rectal cancer, or Colon polyps.  ROS:   Please see the history of present illness.    (+) Upper chest pain/heaviness (+) Bilateral upper arm pain/heaviness (+) Fall (+) Right ankle pain  (+) Right lower arm/hand pain (+) LE edema All other systems reviewed and are negative.  EKGs/Labs/Other Studies Reviewed:    EKG:   08/22/2020: EKG is not ordered today. 06/16/2020: EKG was not ordered.   04/23/19: sinus tachycardia.  Rate 124 bpm.  LVH  Recent Labs: 02/28/2020: ALT 23; Hemoglobin 13.5; Platelets 247 04/10/2020: Magnesium 2.0 06/16/2020:  BUN 11; Creatinine, Ser 0.90; Potassium 3.9; Sodium 140   Recent Lipid Panel    Component Value Date/Time   CHOL 200 (H) 02/28/2020 0841   TRIG 167 (H) 02/28/2020 0841   HDL 40 02/28/2020 0841   CHOLHDL 5.0 (H) 02/28/2020 0841   CHOLHDL 4 10/01/2018 1115   VLDL 22.6 10/01/2018 1115   LDLCALC 130 (H) 02/28/2020 0841   LDLDIRECT 152.0 03/30/2018 1604    Echo 03/12/18: Study Conclusions  - Left ventricle: The cavity size was normal. Wall thickness was  increased in a pattern of moderate LVH. Systolic function was  mildly reduced. The estimated ejection fraction was in the range  of 45% to 50%. Wall motion was normal; there were no regional  wall motion abnormalities. Features are consistent with a  pseudonormal left ventricular filling pattern, with concomitant  abnormal relaxation and increased filling pressure (grade 2  diastolic dysfunction).  - Mitral valve: There was mild regurgitation.  - Pulmonary arteries: Systolic pressure was mildly increased. PA  peak pressure: 33 mm Hg (S).   Lexiscan Myoview  04/20/18:   The left ventricular ejection fraction is moderately decreased (30-44%).  Nuclear stress EF: 41%.  There was no ST segment deviation noted during stress.  This is an intermediate risk study.   Abnormal, intermediate risk stress nuclear study with no ischemia or infarction; EF 41 with global hypokinesis and mild LVE; study intermediate risk due to reduced LV function.  Renal artery Doppler 10/29/18: Right: 1-59% stenosis of the right renal artery. Cyst(s) noted.  Left: 1-59% stenosis of the left renal artery.    LHC/RHC 05/04/19:  Dist LAD-1 lesion is 30% stenosed.  Dist LAD-2 lesion is 80% stenosed.  Dist Cx lesion is 60% stenosed.   Findings:  Ao = 173/104 (134) LV =  181/25 RA =  10 RV =  41/13 PA =  42/10 (26) PCW = 18 Fick cardiac output/index = 5.8/2.9 PVR = 1.4 wu Ao sat = 98% PA sat = 73%, 73%  Assessment: 1. 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 LVEF 55-60% 3. Mildly elevated filling pressures with mild pulmonary venous HTN m4. Widely patent bilateral renal arteries. No evidence of AAA 5. Severe systemic HTN   Monitor 03/23/2020: 3 Day Zio Monitor  Quality: Fair.  Baseline artifact. Predominant rhythm: sinus Average heart rate: 84 bpm Max heart rate: 140 bpm Min heart rate: 57 bpm Pauses >2.5 seconds: none Occasional PVCs and ventricular bigeminy Two runs of SVT up to 8 beats.  Rate 224 bpm.      Physical Exam:    Wt Readings from Last 3 Encounters:  08/22/20 207 lb 6.4 oz (94.1 kg)  08/03/20 206 lb 6.4 oz (93.6 kg)  07/31/20 207 lb 3.2 oz (94 kg)    VS:  BP (!) 200/96 (BP Location: Right Arm, Patient Position: Sitting)   Pulse (!) 50   Ht '5\' 5"'  (1.651 m)   Wt 207 lb 6.4 oz (94.1 kg)   SpO2 99%   BMI 34.51 kg/m  , BMI Body mass index is 34.51 kg/m. GENERAL:  Well appearing HEENT: Pupils equal round and reactive, fundi not visualized, oral mucosa unremarkable NECK:  No jugular venous distention, waveform within normal  limits, carotid upstroke brisk and symmetric, no bruits LUNGS:  Clear to auscultation bilaterally HEART:  RRR.  PMI not displaced or sustained,S1 and S2 within normal limits, no S3, no S4, no clicks, no rubs, no murmurs ABD:  Non-tender.  positive bowel sounds normal in  frequency in pitch, no bruits, no rebound, no guarding, no midline pulsatile mass, no hepatomegaly, no splenomegaly EXT:  2 plus pulses throughout, no edema, no cyanosis no clubbing SKIN:  No rashes no nodules NEURO:  Cranial nerves II through XII grossly intact, motor grossly intact throughout PSYCH:  Cognitively intact, oriented to person place and time   ASSESSMENT:    1. Therapeutic drug monitoring   2. Essential hypertension   3. Malignant hypertension   4. Chronic stable angina ([place])   5. Hypertriglyceridemia without hypercholesterolemia   6. OSA (obstructive sleep apnea)   7. SVT (supraventricular tachycardia) ([facility])     PLAN:   Essential hypertension    Malignant hypertension BP remains very elevated in the office.  It seems to be in the 150s at home.  White coat hypertension superimposed on resistant hypertension.  We will increase hydralazine 142m tid.  Continue amlodipine, clonidine, diltiazem, doxazosin, hydrochlorothiazide, minoxidil, spironolactone, nebivolol, and Entresto.  She was encouraged to continue exercising.  Chronic stable angina ([place] She continues to try and exercise but is not having any improvement in her tachycardia with minimal exertion and also reports heaviness in her chest and arms.  She does have a known 80% distal LAD lesion that has been medically managed.  I will review with one of my Colleagues.  We will plan to have her get a heart cath in the near future.  Continue clopidogrel, amlodipine, nebivolol, rosuvastatin, and Ranexa.  She did not tolerate Imdur due to headaches.  Hypertriglyceridemia without hypercholesterolemia LDL goal is less than 70 given that she has CAD.  We will  repeat lipids and a CMP today.  Continue rosuvastatin.  OSA (obstructive sleep apnea) Continue CPAP  SVT (supraventricular tachycardia) ([facility]) Continue diltiazem and metoprolol.   Secondary Causes of Hypertension  Medications/Herbal: OCP, steroids, stimulants, antidepressants, weight loss medication, immune suppressants, NSAIDs, sympathomimetics, alcohol, caffeine, licorice, ginseng, St. John's wort, chemo Cymbalta  Sleep Apnea: treated Renal artery stenosis: Mild on ultrasound 09/2018.  Not seen on CT-A 2021 Hyperaldosteronism: normal 04/2019 Hyper/hypothyroidism: TSH normal 04/2019 Pheochromocytoma: (testing not indicated)  Cushing's syndrome: Check cortisol Coarctation of the aorta:    Disposition:    F/u in 2 months    Medication Adjustments/Labs and Tests Ordered: Current medicines are reviewed at length with the patient today.  Concerns regarding medicines are outlined above.  Orders Placed This Encounter  Procedures  . Lipid panel  . Comprehensive metabolic panel   Meds ordered this encounter  Medications  . hydrALAZINE (APRESOLINE) 100 MG tablet    Sig: Take 1 tablet (100 mg total) by mouth 3 (three) times daily.    Dispense:  270 tablet    Refill:  3    NEW DOSE, D/C PREVIOUS RX    I,Mathew Stumpf,acting as a scribe for TSkeet Latch MD.,have documented all relevant documentation on the behalf of TSkeet Latch MD,as directed by  TSkeet Latch MD while in the presence of TSkeet Latch MD.  I, TPowerROval Linsey MD have reviewed all documentation for this visit.  The documentation of the exam, diagnosis, procedures, and orders on 08/22/2020 are all accurate and complete.   Signed, TSkeet Latch MD  08/22/2020 10:36 AM    [facility]

## 2020-08-22 ENCOUNTER — Ambulatory Visit (INDEPENDENT_AMBULATORY_CARE_PROVIDER_SITE_OTHER): Payer: Medicare Other | Admitting: Cardiovascular Disease

## 2020-08-22 ENCOUNTER — Other Ambulatory Visit: Payer: Self-pay

## 2020-08-22 ENCOUNTER — Encounter: Payer: Self-pay | Admitting: Cardiovascular Disease

## 2020-08-22 VITALS — BP 200/96 | HR 50 | Ht 65.0 in | Wt 207.4 lb

## 2020-08-22 DIAGNOSIS — E781 Pure hyperglyceridemia: Secondary | ICD-10-CM

## 2020-08-22 DIAGNOSIS — I2089 Other forms of angina pectoris: Secondary | ICD-10-CM

## 2020-08-22 DIAGNOSIS — G4733 Obstructive sleep apnea (adult) (pediatric): Secondary | ICD-10-CM

## 2020-08-22 DIAGNOSIS — I471 Supraventricular tachycardia, unspecified: Secondary | ICD-10-CM

## 2020-08-22 DIAGNOSIS — Z5181 Encounter for therapeutic drug level monitoring: Secondary | ICD-10-CM | POA: Diagnosis not present

## 2020-08-22 DIAGNOSIS — I208 Other forms of angina pectoris: Secondary | ICD-10-CM

## 2020-08-22 DIAGNOSIS — I10 Essential (primary) hypertension: Secondary | ICD-10-CM | POA: Diagnosis not present

## 2020-08-22 HISTORY — DX: Other forms of angina pectoris: I20.8

## 2020-08-22 HISTORY — DX: Supraventricular tachycardia, unspecified: I47.10

## 2020-08-22 HISTORY — DX: Supraventricular tachycardia: I47.1

## 2020-08-22 HISTORY — DX: Other forms of angina pectoris: I20.89

## 2020-08-22 LAB — LIPID PANEL
Chol/HDL Ratio: 4.5 ratio — ABNORMAL HIGH (ref 0.0–4.4)
Cholesterol, Total: 199 mg/dL (ref 100–199)
HDL: 44 mg/dL (ref >39)
LDL Chol Calc (NIH): 134 mg/dL — ABNORMAL HIGH (ref 0–99)
Triglycerides: 114 mg/dL (ref 0–149)
VLDL Cholesterol Cal: 21 mg/dL (ref 5–40)

## 2020-08-22 LAB — COMPREHENSIVE METABOLIC PANEL
ALT: 35 IU/L — ABNORMAL HIGH (ref 0–32)
AST: 30 IU/L (ref 0–40)
Albumin/Globulin Ratio: 1.4 (ref 1.2–2.2)
Albumin: 4.2 g/dL (ref 3.8–4.8)
Alkaline Phosphatase: 67 IU/L (ref 44–121)
BUN/Creatinine Ratio: 12 (ref 9–23)
BUN: 14 mg/dL (ref 6–24)
Bilirubin Total: 0.4 mg/dL (ref 0.0–1.2)
CO2: 26 mmol/L (ref 20–29)
Calcium: 9.6 mg/dL (ref 8.7–10.2)
Chloride: 100 mmol/L (ref 96–106)
Creatinine, Ser: 1.14 mg/dL — ABNORMAL HIGH (ref 0.57–1.00)
Globulin, Total: 3.1 g/dL (ref 1.5–4.5)
Glucose: 90 mg/dL (ref 65–99)
Potassium: 3.8 mmol/L (ref 3.5–5.2)
Sodium: 140 mmol/L (ref 134–144)
Total Protein: 7.3 g/dL (ref 6.0–8.5)
eGFR: 60 mL/min/{1.73_m2} (ref >59)

## 2020-08-22 MED ORDER — HYDRALAZINE HCL 100 MG PO TABS: 100.0000 mg | ORAL_TABLET | Freq: Three times a day (TID) | ORAL | 3 refills | Status: DC

## 2020-08-22 NOTE — Assessment & Plan Note (Signed)
- 

## 2020-08-22 NOTE — Assessment & Plan Note (Signed)
She continues to try and exercise but is not having any improvement in her tachycardia with minimal exertion and also reports heaviness in her chest and arms.  She does have a known 80% distal LAD lesion that has been medically managed.  I will review with one of my Colleagues.  We will plan to have her get a heart cath in the near future.  Continue clopidogrel, amlodipine, nebivolol, rosuvastatin, and Ranexa.  She did not tolerate Imdur due to headaches.

## 2020-08-22 NOTE — Patient Instructions (Addendum)
Medication Instructions:  INCREASE HYDRALAZINE TO 100 MG THREE TIMES A DAY   *If you need a refill on your cardiac medications before your next appointment, please call your pharmacy*  Lab Work: LP/CMET TODAY   CBC/BMET 1 WEEK PRIOR TO CATH  If you have labs (blood work) drawn today and your tests are completely normal, you will receive your results only by: Marland Kitchen MyChart Message (if you have MyChart) OR . A paper copy in the mail If you have any lab test that is abnormal or we need to change your treatment, we will call you to review the results.  Testing/Procedures: Your physician has requested that you have a cardiac catheterization. Cardiac catheterization is used to diagnose and/or treat various heart conditions. Doctors may recommend this procedure for a number of different reasons. The most common reason is to evaluate chest pain. Chest pain can be a symptom of coronary artery disease (CAD), and cardiac catheterization can show whether plaque is narrowing or blocking your heart's arteries. This procedure is also used to evaluate the valves, as well as measure the blood flow and oxygen levels in different parts of your heart. For further information please visit [email or web address]. Please follow instruction sheet, as given. THIS WILL BE SCHEDULED ONCE CONTRAST RESTRICTIONS ARE LIFTED   Follow-Up: At [facility], you and your health needs are our priority.  As part of our continuing mission to provide you with exceptional heart care, we have created designated Provider Care Teams.  These Care Teams include your primary Cardiologist (physician) and Advanced Practice Providers (APPs -  Physician Assistants and Nurse Practitioners) who all work together to provide you with the care you need, when you need it.  We recommend signing up for the patient portal called "MyChart".  Sign up information is provided on this After Visit Summary.  MyChart is used to connect with patients for Virtual  Visits (Telemedicine).  Patients are able to view lab/test results, encounter notes, upcoming appointments, etc.  Non-urgent messages can be sent to your provider as well.   To learn more about what you can do with MyChart, go to [email or web address].    Your next appointment:   2 month(s)  The format for your next appointment:   In Person  Provider:   PA OR DR [facility]    Other Instructions  CATH [place] [address] Dept: [phone] Loc: [phone]  Erica [name]  08/22/2020  1. Please arrive at the [facility] (Main Entrance A) at [facility]: [place], [place] [address] TBD (This time is two hours before your procedure to ensure your preparation). Free valet parking service is available.   Special note: Everyeffort is made to have your procedure done on time. Please understand that emergencies sometimes delay scheduled procedures.  2. Diet: Do not eat solid foods after midnight.  The patient may have clear liquids until 5am upon the day of the procedure.  3. Labs: You will need to have blood drawn on TBD You do not need to be fasting.  4. Medication instructions in preparation for your procedure:  DO NOT TAKE YOUR TORSEMIDE, SPIRONOLACTONE, OR HYDROCHLOROTHIAZIDE MORNING OF PROCEDURE    Contrast Allergy: No  On the morning of your procedure, take your Plavix/Clopidogrel and any morning medicines NOT listed above.  You may use sips of water.  5. Plan for one night stay--bring personal belongings. 6. Bring a current list of your medications and current insurance cards. 7. You MUST have a responsible person to drive you home. 8. Someone MUST be with you the first 24 hours after you arrive home or your discharge will be  delayed. 9. Please wear clothes that are easy to get on and off and wear slip-on shoes.  Thank you for allowing [place] to care for you!   -- [facility]

## 2020-08-22 NOTE — Assessment & Plan Note (Signed)
BP remains very elevated in the office.  It seems to be in the 150s at home.  White coat hypertension superimposed on resistant hypertension.  We will increase hydralazine 100mg  tid.  Continue amlodipine, clonidine, diltiazem, doxazosin, hydrochlorothiazide, minoxidil, spironolactone, nebivolol, and Entresto.  She was encouraged to continue exercising.

## 2020-08-22 NOTE — Assessment & Plan Note (Signed)
Continue CPAP.  

## 2020-08-22 NOTE — Assessment & Plan Note (Signed)
LDL goal is less than 70 given that she has CAD.  We will repeat lipids and a CMP today.  Continue rosuvastatin.

## 2020-08-23 ENCOUNTER — Encounter: Payer: Self-pay | Admitting: Physical Therapy

## 2020-08-23 ENCOUNTER — Ambulatory Visit (INDEPENDENT_AMBULATORY_CARE_PROVIDER_SITE_OTHER): Payer: Medicare Other | Admitting: Physical Therapy

## 2020-08-23 DIAGNOSIS — M25571 Pain in right ankle and joints of right foot: Secondary | ICD-10-CM

## 2020-08-23 DIAGNOSIS — M25672 Stiffness of left ankle, not elsewhere classified: Secondary | ICD-10-CM

## 2020-08-23 DIAGNOSIS — M25671 Stiffness of right ankle, not elsewhere classified: Secondary | ICD-10-CM | POA: Diagnosis not present

## 2020-08-23 DIAGNOSIS — M5412 Radiculopathy, cervical region: Secondary | ICD-10-CM

## 2020-08-23 DIAGNOSIS — R293 Abnormal posture: Secondary | ICD-10-CM

## 2020-08-23 DIAGNOSIS — M25572 Pain in left ankle and joints of left foot: Secondary | ICD-10-CM | POA: Diagnosis not present

## 2020-08-23 NOTE — Patient Instructions (Signed)
Access Code: [email or web address] URL: [email or web address] Date: 08/23/2020 Prepared by: Faustino Congress  Exercises Supine Chin Tuck - 2 x daily - 7 x weekly - 10 reps - 1 sets - 5 sec hold Standing Backward Shoulder Rolls - 2 x daily - 7 x weekly - 10 reps - 1 sets Seated Scapular Retraction - 2 x daily - 7 x weekly - 10 reps - 1 sets - 5 sec hold Seated Ankle Circles - 2 x daily - 7 x weekly - 1 sets - 10 reps Seated Ankle Pumps on Table - 2 x daily - 7 x weekly - 1 sets - 10 reps Standing Gastroc Stretch at Counter - 2 x daily - 7 x weekly - 1 sets - 3 reps - 30 sec hold

## 2020-08-23 NOTE — Therapy (Signed)
[facility] [place], [place], [address] Phone: [phone]   Fax:  [phone]  Physical Therapy Evaluation  Patient Details  Name: Erica [name] MRN: [id number] Date of Birth: [birth year] Referring Provider (PT): Gregor Hams, MD   Encounter Date: 08/23/2020   PT End of Session - 08/23/20 0841    Visit Number 1    Number of Visits 12    Date for PT Re-Evaluation 10/04/20    Authorization Type UHC Medicare    Progress Note Due on Visit 10    PT Start Time 0802    PT Stop Time 0840    PT Time Calculation (min) 38 min    Activity Tolerance Patient tolerated treatment well    Behavior During Therapy [facility] for tasks assessed/performed           Past Medical History:  Diagnosis Date  . Allergy   . Anxiety   . Apnea, sleep 05/04/2014   AHI 31/hr now on CPAP at 12cm H2O  . Arthritis   . Asthma   . Asthma without status asthmaticus 01/19/2016  . Axillary hidradenitis suppurativa 08/01/2011  . Blood transfusion without reported diagnosis   . Breast cancer ([place]) 2010   a. locally advanced left breast carcinoma diagnosed in 2010 and treated with neoadjuvant chemotherapy with docetaxel and cyclophosphamide as well as paclitaxel. This was followed by radiation therapy which was completed in 2011.  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 CHF (congestive heart failure) ([place])   . Chronic diastolic CHF (congestive heart failure) ([place]) 06/23/2013  . Chronic kidney disease   . Chronic pain in left foot 06/29/2018  . Chronic stable angina ([place]) 08/22/2020  . Cough variant asthma 09/27/2010   Followed in Pulmonary clinic/ [facility]/ [facility]  Onset reported in childhood  - PFT's 01/31/2011 FEV1  1.76 (60% ) ratio 68 and no better p B2,  DLCO 70% -med review 09/25/2012 > [place] drug assistance  paperwork given    - HFA 90% 11/20/2012 .  -Med calendar 12/07/12 , 04/06/2013 , 04/18/2014,, 08/30/2014 , 11/08/2014  - PFT's 01/12/2013   1.73 (65%)  Ratio 73 and no better p B2, DLCO 86% - trial off   . 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 Dyspnea on exertion    a. 01/2013 Lexi MV: EF 54%, no ischemia/infarct;  b. 05/2013 Echo: EF 60-65%, no rwma, Gr 2 DD;  b.   . Essential hypertension 10/09/2014   Estimated Creatinine Clearance: 82.4 mL/min (by C-G formula based on SCr of 0.98 mg/dL).  . Fatty liver   . Gastroparesis   . GERD (gastroesophageal reflux disease)   . Hyperglycemia 04/13/2011  . Hypertension   . Hypertensive heart disease 09/26/2015  . Hypertriglyceridemia without hypercholesterolemia 06/15/2015   F[place]ingham risk score is 1%   . Idiopathic intracranial hypertension 10/21/2014  . Impaired glucose tolerance 04/13/2011  . Insomnia secondary to anxiety 04/05/2015  . Lymphadenitis, chronic    restricted LEFT extremity  . Malignant neoplasm of upper-outer quadrant of left breast in female, estrogen receptor negative ([place]) 01/07/2013   Stage III, Invasive Ductal, s/p partial Left Mastectomy/Lumpectomy (baseball sized) with Lymph Node dissection, had Chemo and RadRx    . 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 OSA (obstructive sleep apnea) 05/04/2014   AHI 31/hr now on CPAP at 12cm H2O  . Oxygen deficiency    Uses 02 in CPAP  . Persistent proteinuria 03/31/2018  . Personal history of chemotherapy 2010  .  Personal history of radiation therapy 2010  . Sleep apnea   . SVT (supraventricular tachycardia) ([place]) 08/22/2020  . Tibialis posterior tendinitis, left 07/15/2018   Injected in March 10, 2019  . Upper airway cough syndrome 10/09/2014   Onset around 2012 with assoc pseudowheeze MRI 05/18/15 > Paranasal sinuses are clear - 11/17/2015 rec increase h1 to 2 hs to help with noct cough  - Allergy profile 11/17/2015 >  Eos 0.1 /  IgE  80 POS RAST  to grass, ragweed, cedar tree  - d/c acei 03/06/2018  - flare end of Jan 2020 in setting of ? Uri/ poorly controlled GERD - flare mid Oct 2020 with uri / assoc secondary gerd  .  Vitamin D deficiency disease 03/31/2018    Past Surgical History:  Procedure Laterality Date  . BRAIN SURGERY     [place]  . BREAST LUMPECTOMY Left 09/2008  . CARPAL TUNNEL RELEASE Left 2011  . LYMPH NODE DISSECTION Left 2010   breast; 2 wks after breast lumpectomy  . RIGHT/LEFT HEART CATH AND CORONARY ANGIOGRAPHY N/A 05/04/2019   Procedure: RIGHT/LEFT HEART CATH AND CORONARY ANGIOGRAPHY;  Surgeon: Jolaine Artist, MD;  Location: [facility];  Service: Cardiovascular;  Laterality: N/A;  Renal injection preformed   . TENDON RELEASE Right 2012   "hand"  . TUBAL LIGATION  05/11/2011   Procedure: POST PARTUM TUBAL LIGATION;  Surgeon: Emeterio Reeve, MD;  Location: [facility];  Service: Gynecology;  Laterality: Bilateral;  Induced for HTN    There were no vitals filed for this visit.    Subjective Assessment - 08/23/20 0804    Subjective Pt is a 45 y/o female who presents to [facility] following a fall about 1 month ago resulting in Rt worse than Lt ankle sprains.  She also reports since fall her RUE is "asleep" and has been going on since the fall.    Limitations Standing;Walking    Patient Stated Goals improve symptoms, walk without a limp    Currently in Pain? Yes    Pain Score 5    up to 10/10; at best 4/10   Pain Location Ankle    Pain Orientation Right;Left    Pain Descriptors / Indicators Aching;Tingling    Pain Type Acute pain    Pain Onset More than a month ago    Pain Frequency Constant    Aggravating Factors  standing, walking    Pain Relieving Factors medication    Multiple Pain Sites Yes    Pain Score 0    Pain Location Neck    Pain Orientation Right    Pain Descriptors / Indicators Numbness    Pain Type Acute pain    Pain Onset More than a month ago    Pain Frequency Intermittent    Aggravating Factors  none    Pain Relieving Factors none              [facility] PT Assessment - 08/23/20 0809      Assessment   Medical Diagnosis M25.572 (ICD-10-CM) - Left lateral  ankle pain  M25.571 (ICD-10-CM) - Acute right ankle pain  M54.12 (ICD-10-CM) - Cervical radiculitis    Referring Provider (PT) Gregor Hams, MD    Onset Date/Surgical Date --   ~ 1 month ago   Hand Dominance Left    Next MD Visit 09/04/20    [facility]; following [facility] cancer      Precautions   Precautions None  Precaution Comments has ASO      Restrictions   Weight Bearing Restrictions No      Balance Screen   Has the patient fallen in the past 6 months Yes    How many times? 1    Has the patient had a decrease in activity level because of a fear of falling?  No    Is the patient reluctant to leave their home because of a fear of falling?  No      Home Environment   Living Environment Private residence    [place]   9, 38, 64, 44 y/o children   Type of [place] to enter    Entrance Stairs-Number of Steps 6    Entrance Stairs-Rails Left    [place] One level      Prior Function   Level of Independence Independent    Vocation On disability    Leisure crafting; goes to the [employer] 2-3 days/wk; cardio/weights      Cognition   Overall Cognitive Status Within Functional Limits for tasks assessed      Posture/Postural Control   Posture/Postural Control Postural limitations    Postural Limitations Rounded Shoulders;Forward head;Increased thoracic kyphosis      ROM / Strength   AROM / PROM / Strength AROM;PROM;Strength      AROM   AROM Assessment Site Cervical;Ankle    Right/Left Ankle Right;Left    Right Ankle Dorsiflexion -12    Right Ankle Plantar Flexion 57    Right Ankle Inversion 18    Right Ankle Eversion 3    Left Ankle Dorsiflexion 2    Left Ankle Plantar Flexion 57    Left Ankle Inversion 24    Left Ankle Eversion 11    Cervical Flexion 20    Cervical Extension 30   pain at LS insertion   Cervical - Right Side Bend 30    Cervical - Left Side Bend 32    Cervical - Right Rotation 46     Cervical - Left Rotation 55      PROM   PROM Assessment Site Ankle    Right/Left Ankle Right;Left    Right Ankle Dorsiflexion 0    Right Ankle Inversion 29    Right Ankle Eversion 15    Left Ankle Dorsiflexion 10      Strength   Strength Assessment Site Ankle    Right/Left Ankle Right;Left    Right Ankle Dorsiflexion 2+/5    Right Ankle Plantar Flexion 3/5    Right Ankle Inversion 2+/5    Right Ankle Eversion 2+/5    Left Ankle Dorsiflexion 4/5    Left Ankle Plantar Flexion 4/5    Left Ankle Inversion 4/5    Left Ankle Eversion 4/5      Palpation   Palpation comment mild tenderness bil lateral malleoli      Special Tests    Special Tests Cervical;Ankle/Foot Special Tests    Cervical Tests Spurling's;Dictraction    Ankle/Foot Special Tests  Anterior Drawer Test      Spurling's   Findings Negative      Distraction Test   Findngs Negative      Anterior Drawer Test   Findings Positive    Side  Right    Comments increased laxity noted on Rt                      Objective measurements completed on  examination: See above findings.       [facility] Adult PT Treatment/Exercise - 08/23/20 0809      Exercises   Exercises Other Exercises    Other Exercises  see pt instructions - reviewed/performed each exercise with pt performing ones PRN                  PT Education - 08/23/20 0841    Education Details HEP    Person(s) Educated Patient    Methods Explanation;Demonstration;Handout    Comprehension Verbalized understanding;Returned demonstration;Need further instruction            PT Short Term Goals - 08/23/20 0910      PT SHORT TERM GOAL #1   Title independent with initial HEP    Time 3    Period Weeks    Status New    Target Date 09/13/20             PT Long Term Goals - 08/23/20 0911      PT LONG TERM GOAL #1   Title independent with final HEP    Time 6    Period Weeks    Status New    Target Date 10/04/20      PT LONG  TERM GOAL #2   Title FOTO score improved to 55 for improved function    Time 6    Period Weeks    Status New    Target Date 10/04/20      PT LONG TERM GOAL #3   Title perform cervical ROM without increase in pain for improved function    Time 6    Period Weeks    Status New    Target Date 10/04/20      PT LONG TERM GOAL #4   Title improve Rt ankle DF AROM to at least 0 deg for improved mobility and gait    Time 6    Period Weeks    Status New    Target Date 10/04/20      PT LONG TERM GOAL #5   Title report pain < 3/10 with activity for improved pain    Time 6    Period Weeks    Status New    Target Date 10/04/20                  Plan - 08/23/20 0907    Clinical Impression Statement Pt is a 45 y/o female who presents to [facility] for bil ankle pain (Rt worse than Lt) and Rt sided radiculopathy following a fall about 1 month ago.  She demonstrates decreased strength and ROM as well as postural abnormalties and will benefit from PT to address deficits listed.    Personal Factors and Comorbidities Comorbidity 3+    Comorbidities hx of resistant hypertension, chronic diastolic heart failure, breast cancer s/p chemo and XRT, morbid obesity and OSA intolerant of CPAP    Examination-Activity Limitations Stairs;Carry;Stand;Transfers;Lift;Locomotion Level;Reach Overhead    Examination-Participation Restrictions Cleaning;Community Activity;Driving;Meal Prep;Laundry    Stability/Clinical Decision Making Evolving/Moderate complexity    Clinical Decision Making Moderate    Rehab Potential Fair    PT Frequency 2x / week    PT Duration 6 weeks    PT Treatment/Interventions ADLs/Self Care Home Management;Cryotherapy;Electrical Stimulation;Moist Heat;Iontophoresis 4mg /ml Dexamethasone;Traction;Balance training;Therapeutic exercise;Therapeutic activities;Functional mobility training;Stair training;Gait training;Ultrasound;Neuromuscular re-education;Patient/family education;Manual  techniques;Vasopneumatic Device;Taping;Dry needling;Passive range of motion    PT Next Visit Plan review HEP, trial cervical traction, ankle tband and standing balance activities    PT  Home Exercise Plan Access Code: [email or web address]    Consulted and Agree with Plan of Care Patient           Patient will benefit from skilled therapeutic intervention in order to improve the following deficits and impairments:  Abnormal gait,Obesity,Pain,Decreased strength,Decreased activity tolerance,Decreased mobility,Difficulty walking,Decreased range of motion,Impaired flexibility,Postural dysfunction  Visit Diagnosis: Pain in right ankle and joints of right foot - Plan: PT plan of care cert/re-cert  Stiffness of right ankle, not elsewhere classified - Plan: PT plan of care cert/re-cert  Pain in left ankle and joints of left foot - Plan: PT plan of care cert/re-cert  Stiffness of left ankle, not elsewhere classified - Plan: PT plan of care cert/re-cert  Radiculopathy, cervical region - Plan: PT plan of care cert/re-cert  Abnormal posture - Plan: PT plan of care cert/re-cert     Problem List Patient Active Problem List   Diagnosis Date Noted  . Chronic stable angina ([place]) 08/22/2020  . SVT (supraventricular tachycardia) ([place]) 08/22/2020  . Palpitations 02/28/2020  . Right ankle pain 04/14/2019  . OSA (obstructive sleep apnea) 10/01/2018  . Tibialis posterior tendinitis, left 07/15/2018  . Chronic pain in left foot 06/29/2018  . Vitamin D deficiency disease 03/31/2018  . Persistent proteinuria 03/31/2018  . Hyperlipidemia LDL goal <100 03/30/2018  . 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 Asthma without status asthmaticus 01/19/2016  . Hypertensive heart disease 09/26/2015  . 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 Hypertriglyceridemia without hypercholesterolemia 06/15/2015  . Hypokalemia 06/15/2015  . Insomnia secondary to anxiety 04/05/2015  . Idiopathic  intracranial hypertension 10/21/2014  . Upper airway cough syndrome 10/09/2014  . Essential hypertension 10/09/2014  . Mild obesity 10/09/2014  . Apnea, sleep 05/04/2014  . DOE (dyspnea on exertion) 07/14/2013  . Chronic diastolic CHF (congestive heart failure) ([place]) 06/23/2013  . Malignant neoplasm of upper-outer quadrant of left breast in female, estrogen receptor negative ([place]) 01/07/2013  . Gastroparesis 01/04/2013  . Malignant hypertension 09/17/2011  . Axillary hidradenitis suppurativa 08/01/2011  . Preventative health care 04/13/2011  . Hyperglycemia 04/13/2011  . GERD (gastroesophageal reflux disease) 03/18/2011  . Cough variant asthma 09/27/2010     Laureen Abrahams, PT, DPT 08/23/20 9:18 AM      [facility] [address] [place], [place], [address] Phone: [phone]   Fax:  [phone]  Name: MATILDA FLEIG MRN: [id number] Date of Birth: July 06, 1975

## 2020-08-30 ENCOUNTER — Other Ambulatory Visit: Payer: Self-pay

## 2020-08-30 ENCOUNTER — Ambulatory Visit
Admission: RE | Admit: 2020-08-30 | Discharge: 2020-08-30 | Disposition: A | Discharge status: Home / Self Care | Payer: Medicare Other | Source: Ambulatory Visit | Attending: Oncology | Admitting: Oncology

## 2020-08-30 ENCOUNTER — Other Ambulatory Visit: Payer: Self-pay | Admitting: Neurology

## 2020-08-30 ENCOUNTER — Encounter: Payer: Self-pay | Admitting: Oncology

## 2020-08-30 ENCOUNTER — Telehealth: Payer: Self-pay | Admitting: Cardiovascular Disease

## 2020-08-30 DIAGNOSIS — Z171 Estrogen receptor negative status [ER-]: Secondary | ICD-10-CM

## 2020-08-30 DIAGNOSIS — C50412 Malignant neoplasm of upper-outer quadrant of left female breast: Secondary | ICD-10-CM

## 2020-08-30 NOTE — Telephone Encounter (Signed)
Spoke with pt, aware of lab results. She is taking the rosuvastatin daily but is not sure of the dosage. She is not at home currently but she will look at the dosage on the bottle and let us know what strength she is taking.

## 2020-08-30 NOTE — Telephone Encounter (Signed)
Patient is returning call to discuss lab results. 

## 2020-08-31 ENCOUNTER — Inpatient Hospital Stay: Payer: Medicare Other | Attending: Oncology

## 2020-08-31 ENCOUNTER — Other Ambulatory Visit: Payer: Self-pay | Admitting: Internal Medicine

## 2020-08-31 VITALS — BP 160/88 | HR 58 | Temp 98.2°F | Resp 18

## 2020-08-31 DIAGNOSIS — Z79818 Long term (current) use of other agents affecting estrogen receptors and estrogen levels: Secondary | ICD-10-CM | POA: Insufficient documentation

## 2020-08-31 DIAGNOSIS — C50412 Malignant neoplasm of upper-outer quadrant of left female breast: Secondary | ICD-10-CM | POA: Diagnosis present

## 2020-08-31 DIAGNOSIS — Z170 Estrogen receptor positive status [ER+]: Secondary | ICD-10-CM | POA: Diagnosis not present

## 2020-08-31 MED ORDER — GOSERELIN ACETATE 3.6 MG ~~LOC~~ IMPL
DRUG_IMPLANT | SUBCUTANEOUS | Status: AC
  Filled 2020-08-31: qty 3.6

## 2020-08-31 MED ORDER — GOSERELIN ACETATE 3.6 MG ~~LOC~~ IMPL
3.6000 mg | DRUG_IMPLANT | SUBCUTANEOUS | Status: DC
  Administered 2020-08-31: 3.6 mg via SUBCUTANEOUS

## 2020-09-01 ENCOUNTER — Other Ambulatory Visit: Payer: Self-pay | Admitting: Cardiovascular Disease

## 2020-09-01 DIAGNOSIS — G932 Benign intracranial hypertension: Secondary | ICD-10-CM

## 2020-09-01 DIAGNOSIS — I10 Essential (primary) hypertension: Secondary | ICD-10-CM

## 2020-09-01 NOTE — Progress Notes (Signed)
I, [name], LAT, ATC, am serving as scribe for Dr. Lynne Leader.  Erica [name] is a 45 y.o. female who presents to [place] at [facility] today for f/u of B ankle pain, R wrist and neck pain.  She was last seen by [name] on 08/03/20 after having fallen up the stairs, landing on her R side and reported B ankle pain and R forearm pain.  She was referred to PT of which she has completed 1 session.  She was advised to increase her Gabapentin dose to 900 mg tid and was prescribed prednisone.  She was also advised to get an ankle brace and wrist brace.  Since her last visit, pt reports some improvement in her pain overall. Pt has been compliant in wearing her ankle and wrist braces. Pt c/o R index finger pain and decrease finger flexion. Pt reports finger has been hurting since her fall. Slight swelling present. Pt has been wearing [facility] on [facility]  Diagnostic testing: R ankle and R foot XR- 08/03/20  Pertinent review of systems: No fevers or chills  Relevant historical information: Breast cancer   Exam:  BP (!) 197/107 (BP Location: Right Arm, Patient Position: Sitting, Cuff Size: Normal)   Pulse 95   Ht 5\' 5"  (1.651 m)   Wt 206 lb (93.4 kg)   SpO2 97%   BMI 34.28 kg/m  General: Well Developed, well nourished, and in no acute distress.   MSK:  C-spine normal. Nontender midline. Decreased cervical motion. Positive right-sided Spurling's test. Upper extremity strength reflexes and sensation are intact.  Right index finger normal. Decreased motion to flexion PIP and DIP pain Tender to palpation DIP and PIP. Stable ligamentous exam is radial and ulnar stress test DIP and PIP. Intact strength to flexion and extension DIP PIP and MCP.  Right ankle decreased motion  Lab and Radiology Results  X-ray images C-spine and the right index finger obtained today personally and independently interpreted  Right index finger: No  frecture. No severe DJD  C-spine: No fracture. Shunt present. No severe DJD  Await formal radiology review    Assessment and Plan: 45 y.o. female with right index finger pain and PIP and DIP.  Fracture unlikely.  Patient effectively has some synovitis secondary to trauma.  Plan for double Band-Aid splint immobilization as needed and home exercise program/general PT.  Also recommend heating pad and Voltaren gel.  Reassess in about a month.  If not better consider dedicated hand PT.  She is already attending regular physical therapy so would like to not add more additional physical therapy if possible.  Right arm paresthesias.  Difficult to tell if this is cervical radiculopathy or carpal tunnel syndrome or both.  At this point favor cervical radiculopathy probably C7 based on positive Spurling's test.  She is failing conservative management and.  She is requiring maximum dose of gabapentin and still not doing well.  This is a long-term ongoing issue.  Given her cancer history we will go ahead and obtain x-ray and proceed to MRI now of cervical spine.  Recheck after MRI.  Ankle pain after falling.  Just darted physical therapy now.  Recheck in a month; proceed to PT.     PDMP not reviewed this encounter. Orders Placed This Encounter  Procedures  . DG Cervical Spine 2 or 3 views    Standing Status:   Future    Standing Expiration  Date:   09/04/2021    Order Specific Question:   Reason for Exam (SYMPTOM  OR DIAGNOSIS REQUIRED)    Answer:   eval rt c7 radiculopathy    Order Specific Question:   Is patient pregnant?    Answer:   No    Order Specific Question:   Preferred imaging location?    Answer:   [facility]  . MR CERVICAL SPINE WO CONTRAST    Standing Status:   Future    Standing Expiration Date:   09/04/2021    Order Specific Question:   What is the patient's sedation requirement?    Answer:   No Sedation    Order Specific Question:   Does the patient have a pacemaker or  implanted devices?    Answer:   No    Order Specific Question:   Preferred imaging location?    Answer:   [facility] (table limit-350lbs)  . DG Finger Index Right    Standing Status:   Future    Standing Expiration Date:   09/04/2021    Order Specific Question:   Reason for Exam (SYMPTOM  OR DIAGNOSIS REQUIRED)    Answer:   eval pip pain    Order Specific Question:   Is patient pregnant?    Answer:   No    Order Specific Question:   Preferred imaging location?    Answer:   [facility]   Meds ordered this encounter  Medications  . LORazepam (ATIVAN) 0.5 MG tablet    Sig: 1-2 tabs 30 - 60 min prior to MRI. Do not drive with this medicine.    Dispense:  4 tablet    Refill:  0     Discussed warning signs or symptoms. Please see discharge instructions. Patient expresses understanding.   The above documentation has been reviewed and is accurate and complete Lynne Leader, M.D.

## 2020-09-04 ENCOUNTER — Other Ambulatory Visit: Payer: Self-pay

## 2020-09-04 ENCOUNTER — Ambulatory Visit (INDEPENDENT_AMBULATORY_CARE_PROVIDER_SITE_OTHER): Payer: Medicare Other

## 2020-09-04 ENCOUNTER — Ambulatory Visit (INDEPENDENT_AMBULATORY_CARE_PROVIDER_SITE_OTHER): Payer: Medicare Other | Admitting: Family Medicine

## 2020-09-04 VITALS — BP 197/107 | HR 95 | Ht 65.0 in | Wt 206.0 lb

## 2020-09-04 DIAGNOSIS — M5412 Radiculopathy, cervical region: Secondary | ICD-10-CM

## 2020-09-04 DIAGNOSIS — M79644 Pain in right finger(s): Secondary | ICD-10-CM

## 2020-09-04 DIAGNOSIS — M25571 Pain in right ankle and joints of right foot: Secondary | ICD-10-CM

## 2020-09-04 MED ORDER — LORAZEPAM 0.5 MG PO TABS: ORAL_TABLET | ORAL | 0 refills | Status: DC

## 2020-09-04 NOTE — Patient Instructions (Addendum)
Thank you for coming in today.  Please get an Xray today before you leave  You should hear from MRI scheduling within 1 week. If you do not hear please let me know.   Proceed to PT for your ankle.   Please use Voltaren gel (Generic Diclofenac Gel) up to 4x daily for pain as needed.  This is available over-the-counter as both the name brand Voltaren gel and the generic diclofenac gel.  Modeling clay for hand PT on your own.   Heat helps for that finger.   Try the double bandaid splint.   I will be seeing you soon after the MRI.  Otherwise recheck in 1 month.

## 2020-09-05 NOTE — Progress Notes (Signed)
X-ray right index finger looks normal to radiology.  No arthritis is seen no fractures are seen.

## 2020-09-05 NOTE — Progress Notes (Signed)
X-ray cervical spine shows evidence of neck spasm.  No fractures are seen.  No severe arthritis.

## 2020-09-07 ENCOUNTER — Other Ambulatory Visit: Payer: Self-pay

## 2020-09-07 ENCOUNTER — Encounter: Payer: Self-pay | Admitting: Rehabilitative and Restorative Service Providers"

## 2020-09-07 ENCOUNTER — Ambulatory Visit (INDEPENDENT_AMBULATORY_CARE_PROVIDER_SITE_OTHER): Payer: Medicare Other | Admitting: Rehabilitative and Restorative Service Providers"

## 2020-09-07 DIAGNOSIS — R262 Difficulty in walking, not elsewhere classified: Secondary | ICD-10-CM | POA: Diagnosis not present

## 2020-09-07 DIAGNOSIS — M25671 Stiffness of right ankle, not elsewhere classified: Secondary | ICD-10-CM

## 2020-09-07 DIAGNOSIS — M25672 Stiffness of left ankle, not elsewhere classified: Secondary | ICD-10-CM

## 2020-09-07 DIAGNOSIS — M6281 Muscle weakness (generalized): Secondary | ICD-10-CM

## 2020-09-07 DIAGNOSIS — M5412 Radiculopathy, cervical region: Secondary | ICD-10-CM

## 2020-09-07 DIAGNOSIS — R293 Abnormal posture: Secondary | ICD-10-CM

## 2020-09-07 NOTE — Patient Instructions (Signed)
Access Code: [email or web address] URL: [email or web address] Date: 09/07/2020 Prepared by: Vista Mink  Exercises Supine Chin Tuck - 2 x daily - 7 x weekly - 10 reps - 1 sets - 5 sec hold Seated Scapular Retraction - 2 x daily - 7 x weekly - 10 reps - 1 sets - 5 sec hold Standing Gastroc Stretch at Counter - 2 x daily - 7 x weekly - 1 sets - 4-5 reps - 30 sec hold Tandem Stance - 2 x daily - 7 x weekly - 1 sets - 5 reps - 20 second hold Heel Toe Raises with Counter Support - 2 x daily - 7 x weekly - 2-3 sets - 10 reps - 3 seconds hold

## 2020-09-07 NOTE — Therapy (Signed)
[facility] [address] [place], [place], [address] Phone: [phone]   Fax:  [phone]  Physical Therapy Treatment  Patient Details  Name: Erica [name] MRN: [id number] Date of Birth: 1976-01-05 Referring Provider (PT): Gregor Hams, MD   Encounter Date: 09/07/2020   PT End of Session - 09/07/20 0852     Visit Number 2    Number of Visits 12    Date for PT Re-Evaluation 10/04/20    Authorization Type U[employer] Medicare    Progress Note Due on Visit 10    PT Start Time 0805    PT Stop Time 0846    PT Time Calculation (min) 41 min    Activity Tolerance Patient tolerated treatment well;No increased pain;Patient limited by fatigue    Behavior During Therapy [facility] for tasks assessed/performed             Past Medical History:  Diagnosis Date   Allergy    Anxiety    Apnea, sleep 05/04/2014   AHI 31/hr now on CPAP at 12cm H2O   Arthritis    Asthma    Asthma without status asthmaticus 01/19/2016   Axillary hidradenitis suppurativa 08/01/2011   Blood transfusion without reported diagnosis    Breast cancer ([place]) 2010   a. locally advanced left breast carcinoma diagnosed in 2010 and treated with neoadjuvant chemotherapy with docetaxel and cyclophosphamide as well as paclitaxel. This was followed by radiation therapy which was completed in 2011.   Chemotherapy-induced peripheral neuropathy ([place]) 12/09/2016   CHF (congestive heart failure) (HCC)    Chronic diastolic CHF (congestive heart failure) ([place]) 06/23/2013   Chronic kidney disease    Chronic pain in left foot 06/29/2018   Chronic stable angina ([place]) 08/22/2020   Cough variant asthma 09/27/2010   Followed in Pulmonary clinic/ [facility]/ [place]  Onset reported in childhood  - PFT's 01/31/2011 FEV1  1.76 (60% ) ratio 68 and no better p B2,  DLCO 70% -med review 09/25/2012 > [place] drug assistance  paperwork given    - HFA 90% 11/20/2012 .  -Med calendar 12/07/12 , 04/06/2013 , 04/18/2014,, 08/30/2014  , 11/08/2014  - PFT's 01/12/2013  1.73 (65%)  Ratio 73 and no better p B2, DLCO 86% - trial off    Depression with somatization 06/15/2015   Dyspnea on exertion    a. 01/2013 Lexi MV: EF 54%, no ischemia/infarct;  b. 05/2013 Echo: EF 60-65%, no rwma, Gr 2 DD;  b.    Essential hypertension 10/09/2014   Estimated Creatinine Clearance: 82.4 mL/min (by C-G formula based on SCr of 0.98 mg/dL).   Fatty liver    Gastroparesis    GERD (gastroesophageal reflux disease)    Hyperglycemia 04/13/2011   Hypertension    Hypertensive heart disease 09/26/2015   Hypertriglyceridemia without hypercholesterolemia 06/15/2015   F[place]ingham risk score is 1%    Idiopathic intracranial hypertension 10/21/2014   Impaired glucose tolerance 04/13/2011   Insomnia secondary to anxiety 04/05/2015   Lymphadenitis, chronic    restricted LEFT extremity   Malignant neoplasm of upper-outer quadrant of left breast in female, estrogen receptor negative ([place]) 01/07/2013   Stage III, Invasive Ductal, s/p partial Left Mastectomy/Lumpectomy (baseball sized) with Lymph Node dissection, had Chemo and RadRx     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[place]) 09/27/2010   OSA (obstructive sleep apnea) 05/04/2014   AHI 31/hr now on CPAP at 12cm H2O   Oxygen deficiency    Uses 02 in CPAP   Persistent proteinuria 03/31/2018  Personal history of chemotherapy 2010   Personal history of radiation therapy 2010   Sleep apnea    SVT (supraventricular tachycardia) ([place]) 08/22/2020   Tibialis posterior tendinitis, left 07/15/2018   Injected in March 10, 2019   Upper airway cough syndrome 10/09/2014   Onset around 2012 with assoc pseudowheeze MRI 05/18/15 > Paranasal sinuses are clear - 11/17/2015 rec increase h1 to 2 hs to help with noct cough  - Allergy profile 11/17/2015 >  Eos 0.1 /  IgE  80 POS RAST  to grass, ragweed, cedar tree  - d/c acei 03/06/2018  - flare end of Jan 2020 in setting of ? Uri/ poorly controlled GERD - flare mid Oct 2020 with uri / assoc secondary gerd    Vitamin D deficiency disease 03/31/2018    Past Surgical History:  Procedure Laterality Date   BRAIN SURGERY     Shunt   BREAST LUMPECTOMY Left 09/2008   CARPAL TUNNEL RELEASE Left 2011   LYMPH NODE DISSECTION Left 2010   breast; 2 wks after breast lumpectomy   RIGHT/LEFT HEART CATH AND CORONARY ANGIOGRAPHY N/A 05/04/2019   Procedure: RIGHT/LEFT HEART CATH AND CORONARY ANGIOGRAPHY;  Surgeon: Jolaine Artist, MD;  Location: [place] [facility];  Service: Cardiovascular;  Laterality: N/A;  Renal injection preformed    TENDON RELEASE Right 2012   "hand"   TUBAL LIGATION  05/11/2011   Procedure: POST PARTUM TUBAL LIGATION;  Surgeon: Emeterio Reeve, MD;  Location: [facility];  Service: Gynecology;  Laterality: Bilateral;  Induced for HTN    There were no vitals filed for this visit.   Subjective Assessment - 09/07/20 0809     Subjective Erica [name] reports good early HEP compliance.    Limitations Standing;Walking    Patient Stated Goals improve symptoms, walk without a limp    Currently in Pain? Yes    Pain Score 4     Pain Location Ankle    Pain Orientation Left;Right    Pain Descriptors / Indicators Aching;Tightness    Pain Type Acute pain    Pain Onset More than a month ago    Pain Frequency Intermittent    Aggravating Factors  Sit to stand transfers are stiff or with overuse    Effect of Pain on Daily Activities Limits standing and walking endurance    Multiple Pain Sites No    Pain Onset More than a month ago                [facility] PT Assessment - 09/07/20 0001       AROM   Right Ankle Dorsiflexion 0    Left Ankle Dorsiflexion 6                           [facility] Adult PT Treatment/Exercise - 09/07/20 0001       Posture/Postural Control   Posture/Postural Control Postural limitations    Postural Limitations Forward head;Rounded Shoulders;Decreased lumbar lordosis      Neuro Re-ed    Neuro Re-ed Details  Tandem balance 5X 30 seconds      Exercises    Exercises Ankle      Ankle Exercises: Stretches   Gastroc Stretch 5 reps;20 seconds    Gastroc Stretch Limitations Also slant board 3X 1 minute      Ankle Exercises: Aerobic   Other Aerobic UBE arms (pull) and legs :30 pull UE/:30 LE/:30 rest for 5 minutes      Ankle  Exercises: Standing   Heel Raises 10 reps;3 seconds    Heel Raises Limitations 2 sets with toe raises    Toe Raise 10 reps;3 seconds    Toe Raise Limitations 2 sets with heel raises      Ankle Exercises: Seated   Other Seated Ankle Exercises Cervical Isometrics Extension 10X 5 seconds with chin tuck    Other Seated Ankle Exercises Shoulder blade pinches 10X 5 seconds                    PT Education - 09/07/20 0851     Education Details Reviewed and updated HEP.    Person(s) Educated Patient    Methods Explanation;Demonstration;Tactile cues;Verbal cues;Handout    Comprehension Verbal cues required;Returned demonstration;Need further instruction;Tactile cues required;Verbalized understanding              PT Short Term Goals - 09/07/20 0852       PT SHORT TERM GOAL #1   Title independent with initial HEP    Time 3    Period Weeks    Status Achieved    Target Date 09/13/20               PT Long Term Goals - 09/07/20 3235       PT LONG TERM GOAL #1   Title independent with final HEP    Time 6    Period Weeks    Status On-going      PT LONG TERM GOAL #2   Title FOTO score improved to 55 for improved function    Time 6    Period Weeks    Status On-going      PT LONG TERM GOAL #3   Title perform cervical ROM without increase in pain for improved function    Time 6    Period Weeks    Status On-going      PT LONG TERM GOAL #4   Title improve Rt ankle DF AROM to at least 0 deg for improved mobility and gait    Time 6    Period Weeks    Status Achieved      PT LONG TERM GOAL #5   Title report pain < 3/10 with activity for improved pain    Time 6    Period Weeks    Status  On-going                   Plan - 09/07/20 0853     Clinical Impression Statement Erica [name] reports and demonstrates early HEP compliance.  Dorsiflexion AROM has improved B (see objective) and self-reported pain has improved.  Added some ankle strength, balance and proprioception activities to complement her current AROM, postural and cervical work.  Progress next visit as appropriate.    Personal Factors and Comorbidities Comorbidity 3+    Comorbidities hx of resistant hypertension, chronic diastolic heart failure, breast cancer s/p chemo and XRT, morbid obesity and OSA intolerant of CPAP    Examination-Activity Limitations Stairs;Carry;Stand;Transfers;Lift;Locomotion Level;Reach Overhead    Examination-Participation Restrictions Cleaning;Community Activity;Driving;Meal Prep;Laundry    Stability/Clinical Decision Making Evolving/Moderate complexity    Rehab Potential Fair    PT Frequency 2x / week    PT Duration 6 weeks    PT Treatment/Interventions ADLs/Self Care Home Management;Cryotherapy;Electrical Stimulation;Moist Heat;Iontophoresis 4mg /ml Dexamethasone;Traction;Balance training;Therapeutic exercise;Therapeutic activities;Functional mobility training;Stair training;Gait training;Ultrasound;Neuromuscular re-education;Patient/family education;Manual techniques;Vasopneumatic Device;Taping;Dry needling;Passive range of motion    PT Next Visit Plan Cervical traction possibility, ankle t-band, strength, postural strength and standing  balance activities    PT Home Exercise Plan Access Code: [email or web address]    Consulted and Agree with Plan of Care Patient             Patient will benefit from skilled therapeutic intervention in order to improve the following deficits and impairments:  Abnormal gait, Obesity, Pain, Decreased strength, Decreased activity tolerance, Decreased mobility, Difficulty walking, Decreased range of motion, Impaired flexibility, Postural dysfunction  Visit  Diagnosis: Difficulty walking  Abnormal posture  Muscle weakness (generalized)  Stiffness of left ankle, not elsewhere classified  Stiffness of right ankle, not elsewhere classified  Radiculopathy, cervical region     Problem List Patient Active Problem List   Diagnosis Date Noted   Chronic stable angina ([place]) 08/22/2020   SVT (supraventricular tachycardia) ([place]) 08/22/2020   Palpitations 02/28/2020   Right ankle pain 04/14/2019   OSA (obstructive sleep apnea) 10/01/2018   Tibialis posterior tendinitis, left 07/15/2018   Chronic pain in left foot 06/29/2018   Vitamin D deficiency disease 03/31/2018   Persistent proteinuria 03/31/2018   Hyperlipidemia LDL goal <100 03/30/2018   Chemotherapy-induced peripheral neuropathy ([place]) 12/09/2016   Asthma without status asthmaticus 01/19/2016   Hypertensive heart disease 09/26/2015   Depression with somatization 06/15/2015   Hypertriglyceridemia without hypercholesterolemia 06/15/2015   Hypokalemia 06/15/2015   Insomnia secondary to anxiety 04/05/2015   Idiopathic intracranial hypertension 10/21/2014   Upper airway cough syndrome 10/09/2014   Essential hypertension 10/09/2014   Mild obesity 10/09/2014   Apnea, sleep 05/04/2014   DOE (dyspnea on exertion) 07/14/2013   Chronic diastolic CHF (congestive heart failure) ([place]) 06/23/2013   Malignant neoplasm of upper-outer quadrant of left breast in female, estrogen receptor negative ([place]) 01/07/2013   Gastroparesis 01/04/2013   Malignant hypertension 09/17/2011   Axillary hidradenitis suppurativa 08/01/2011   Preventative health care 04/13/2011   Hyperglycemia 04/13/2011   GERD (gastroesophageal reflux disease) 03/18/2011   Cough variant asthma 09/27/2010    Farley Ly PT, MPT 09/07/2020, 8:56 AM  [facility] [address] [place], [place], [address] Phone: [phone]   Fax:  [phone]  Name: Erica [name] MRN: [id number] Date of  Birth: 08/23/1975

## 2020-09-10 ENCOUNTER — Other Ambulatory Visit: Payer: Self-pay | Admitting: Internal Medicine

## 2020-09-10 ENCOUNTER — Other Ambulatory Visit: Payer: Self-pay | Admitting: Neurology

## 2020-09-11 ENCOUNTER — Other Ambulatory Visit: Payer: Medicare Other

## 2020-09-12 ENCOUNTER — Ambulatory Visit (INDEPENDENT_AMBULATORY_CARE_PROVIDER_SITE_OTHER): Payer: Medicare Other | Admitting: Physical Therapy

## 2020-09-12 ENCOUNTER — Other Ambulatory Visit: Payer: Self-pay

## 2020-09-12 DIAGNOSIS — R293 Abnormal posture: Secondary | ICD-10-CM

## 2020-09-12 DIAGNOSIS — M6281 Muscle weakness (generalized): Secondary | ICD-10-CM

## 2020-09-12 DIAGNOSIS — M5412 Radiculopathy, cervical region: Secondary | ICD-10-CM

## 2020-09-12 DIAGNOSIS — M25672 Stiffness of left ankle, not elsewhere classified: Secondary | ICD-10-CM

## 2020-09-12 DIAGNOSIS — R262 Difficulty in walking, not elsewhere classified: Secondary | ICD-10-CM | POA: Diagnosis not present

## 2020-09-12 DIAGNOSIS — M25572 Pain in left ankle and joints of left foot: Secondary | ICD-10-CM

## 2020-09-12 DIAGNOSIS — M25571 Pain in right ankle and joints of right foot: Secondary | ICD-10-CM

## 2020-09-12 DIAGNOSIS — M25671 Stiffness of right ankle, not elsewhere classified: Secondary | ICD-10-CM

## 2020-09-12 NOTE — Therapy (Signed)
[facility] Physical Therapy [address] [place], [place], [address] Phone: [phone]   Fax:  [phone]  Physical Therapy Treatment  Patient Details  Name: Erica [name] MRN: [id number] Date of Birth: September 29, 1975 Referring Provider (PT): Gregor Hams, MD   Encounter Date: 09/12/2020   PT End of Session - 09/12/20 0841     Visit Number 3    Number of Visits 12    Date for PT Re-Evaluation 10/04/20    Authorization Type UHC Medicare    Progress Note Due on Visit 10    PT Start Time 0802    PT Stop Time 0841    PT Time Calculation (min) 39 min    Activity Tolerance Patient tolerated treatment well;No increased pain;Patient limited by fatigue    Behavior During Therapy [facility] for tasks assessed/performed             Past Medical History:  Diagnosis Date   Allergy    Anxiety    Apnea, sleep 05/04/2014   AHI 31/hr now on CPAP at 12cm H2O   Arthritis    Asthma    Asthma without status asthmaticus 01/19/2016   Axillary hidradenitis suppurativa 08/01/2011   Blood transfusion without reported diagnosis    Breast cancer ([place]) 2010   a. locally advanced left breast carcinoma diagnosed in 2010 and treated with neoadjuvant chemotherapy with docetaxel and cyclophosphamide as well as paclitaxel. This was followed by radiation therapy which was completed in 2011.   Chemotherapy-induced peripheral neuropathy ([place]) 12/09/2016   CHF (congestive heart failure) (HCC)    Chronic diastolic CHF (congestive heart failure) ([place]) 06/23/2013   Chronic kidney disease    Chronic pain in left foot 06/29/2018   Chronic stable angina ([place]) 08/22/2020   Cough variant asthma 09/27/2010   Followed in Pulmonary clinic/ [facility]/ [facility]  Onset reported in childhood  - PFT's 01/31/2011 FEV1  1.76 (60% ) ratio 68 and no better p B2,  DLCO 70% -med review 09/25/2012 > Dulera drug assistance  paperwork given    - HFA 90% 11/20/2012 .  -Med calendar 12/07/12 , 04/06/2013 , 04/18/2014,,  08/30/2014 , 11/08/2014  - PFT's 01/12/2013  1.73 (65%)  Ratio 73 and no better p B2, DLCO 86% - trial off    Depression with somatization 06/15/2015   Dyspnea on exertion    a. 01/2013 Lexi MV: EF 54%, no ischemia/infarct;  b. 05/2013 Echo: EF 60-65%, no rwma, Gr 2 DD;  b.    Essential hypertension 10/09/2014   Estimated Creatinine Clearance: 82.4 mL/min (by C-G formula based on SCr of 0.98 mg/dL).   Fatty liver    Gastroparesis    GERD (gastroesophageal reflux disease)    Hyperglycemia 04/13/2011   Hypertension    Hypertensive heart disease 09/26/2015   Hypertriglyceridemia without hypercholesterolemia 06/15/2015   Framingham risk score is 1%    Idiopathic intracranial hypertension 10/21/2014   Impaired glucose tolerance 04/13/2011   Insomnia secondary to anxiety 04/05/2015   Lymphadenitis, chronic    restricted LEFT extremity   Malignant neoplasm of upper-outer quadrant of left breast in female, estrogen receptor negative ([place]) 01/07/2013   Stage III, Invasive Ductal, s/p partial Left Mastectomy/Lumpectomy (baseball sized) with Lymph Node dissection, had Chemo and RadRx     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[place]) 09/27/2010   OSA (obstructive sleep apnea) 05/04/2014   AHI 31/hr now on CPAP at 12cm H2O   Oxygen deficiency    Uses 02 in CPAP   Persistent proteinuria 03/31/2018  Personal history of chemotherapy 2010   Personal history of radiation therapy 2010   Sleep apnea    SVT (supraventricular tachycardia) ([place]) 08/22/2020   Tibialis posterior tendinitis, left 07/15/2018   Injected in March 10, 2019   Upper airway cough syndrome 10/09/2014   Onset around 2012 with assoc pseudowheeze MRI 05/18/15 > Paranasal sinuses are clear - 11/17/2015 rec increase h1 to 2 hs to help with noct cough  - Allergy profile 11/17/2015 >  Eos 0.1 /  IgE  80 POS RAST  to grass, ragweed, cedar tree  - d/c acei 03/06/2018  - flare end of Jan 2020 in setting of ? Uri/ poorly controlled GERD - flare mid Oct 2020 with uri / assoc  secondary gerd   Vitamin D deficiency disease 03/31/2018    Past Surgical History:  Procedure Laterality Date   BRAIN SURGERY     Shunt   BREAST LUMPECTOMY Left 09/2008   CARPAL TUNNEL RELEASE Left 2011   LYMPH NODE DISSECTION Left 2010   breast; 2 wks after breast lumpectomy   RIGHT/LEFT HEART CATH AND CORONARY ANGIOGRAPHY N/A 05/04/2019   Procedure: RIGHT/LEFT HEART CATH AND CORONARY ANGIOGRAPHY;  Surgeon: Jolaine Artist, MD;  Location: [place] [facility];  Service: Cardiovascular;  Laterality: N/A;  Renal injection preformed    TENDON RELEASE Right 2012   "hand"   TUBAL LIGATION  05/11/2011   Procedure: POST PARTUM TUBAL LIGATION;  Surgeon: Emeterio Reeve, MD;  Location: [facility];  Service: Gynecology;  Laterality: Bilateral;  Induced for HTN    There were no vitals filed for this visit.   Subjective Assessment - 09/12/20 0805     Subjective not sure if she's going to get the MRI because of her VP shunt - ankle feels like its getting better.    Limitations Standing;Walking    Patient Stated Goals improve symptoms, walk without a limp    Currently in Pain? Yes    Pain Score 4     Pain Location Ankle    Pain Orientation Left;Right    Pain Descriptors / Indicators Aching;Tightness    Pain Type Acute pain    Pain Onset More than a month ago    Pain Frequency Intermittent    Aggravating Factors  walking    Pain Relieving Factors medication, sitting and resting    Pain Score 3    Pain Location Neck    Pain Orientation Right    Pain Descriptors / Indicators Numbness    Pain Type Acute pain    Pain Onset More than a month ago    Pain Frequency Intermittent    Aggravating Factors  none    Pain Relieving Factors none                               [facility] Adult PT Treatment/Exercise - 09/12/20 0806       Neuro Re-ed    Neuro Re-ed Details  Tandem balance 5X 20 seconds; bil with intermittent UE support      Modalities   Modalities Traction      Traction    Type of Traction Cervical    Min (lbs) 10    Max (lbs) 15    Hold Time 60    Rest Time 20    Time 4 - unable to tolerate supine so stopped early      Ankle Exercises: Aerobic   Nustep L5 x 8 min  Ankle Exercises: Standing   Heel Raises 10 reps;3 seconds    Heel Raises Limitations --   3 sets with toe raises     Ankle Exercises: Stretches   Gastroc Stretch 3 reps;30 seconds                      PT Short Term Goals - 09/07/20 0852       PT SHORT TERM GOAL #1   Title independent with initial HEP    Time 3    Period Weeks    Status Achieved    Target Date 09/13/20               PT Long Term Goals - 09/07/20 2706       PT LONG TERM GOAL #1   Title independent with final HEP    Time 6    Period Weeks    Status On-going      PT LONG TERM GOAL #2   Title FOTO score improved to 55 for improved function    Time 6    Period Weeks    Status On-going      PT LONG TERM GOAL #3   Title perform cervical ROM without increase in pain for improved function    Time 6    Period Weeks    Status On-going      PT LONG TERM GOAL #4   Title improve Rt ankle DF AROM to at least 0 deg for improved mobility and gait    Time 6    Period Weeks    Status Achieved      PT LONG TERM GOAL #5   Title report pain < 3/10 with activity for improved pain    Time 6    Period Weeks    Status On-going                   Plan - 09/12/20 2376     Clinical Impression Statement Trialed traction today but only able to tolerate 4 min due to difficulty with breathing and SOD.  She reports she will bring O2 next visit to see if she's able to better tolerate traction.  Ankle slowly improving today with review of HEP with good tolerance.    Personal Factors and Comorbidities Comorbidity 3+    Comorbidities hx of resistant hypertension, chronic diastolic heart failure, breast cancer s/p chemo and XRT, morbid obesity and OSA intolerant of CPAP    Examination-Activity  Limitations Stairs;Carry;Stand;Transfers;Lift;Locomotion Level;Reach Overhead    Examination-Participation Restrictions Cleaning;Community Activity;Driving;Meal Prep;Laundry    Stability/Clinical Decision Making Evolving/Moderate complexity    Rehab Potential Fair    PT Frequency 2x / week    PT Duration 6 weeks    PT Treatment/Interventions ADLs/Self Care Home Management;Cryotherapy;Electrical Stimulation;Moist Heat;Iontophoresis 4mg /ml Dexamethasone;Traction;Balance training;Therapeutic exercise;Therapeutic activities;Functional mobility training;Stair training;Gait training;Ultrasound;Neuromuscular re-education;Patient/family education;Manual techniques;Vasopneumatic Device;Taping;Dry needling;Passive range of motion    PT Next Visit Plan Cervical traction possibility - trial if she has O2, ankle t-band, strength, postural strength and standing balance activities    PT Home Exercise Plan Access Code: [email or web address]    Consulted and Agree with Plan of Care Patient             Patient will benefit from skilled therapeutic intervention in order to improve the following deficits and impairments:  Abnormal gait, Obesity, Pain, Decreased strength, Decreased activity tolerance, Decreased mobility, Difficulty walking, Decreased range of motion, Impaired flexibility, Postural dysfunction  Visit Diagnosis: Difficulty walking  Abnormal  posture  Muscle weakness (generalized)  Stiffness of left ankle, not elsewhere classified  Stiffness of right ankle, not elsewhere classified  Radiculopathy, cervical region  Pain in right ankle and joints of right foot  Pain in left ankle and joints of left foot     Problem List Patient Active Problem List   Diagnosis Date Noted   Chronic stable angina ([place]) 08/22/2020   SVT (supraventricular tachycardia) ([place]) 08/22/2020   Palpitations 02/28/2020   Right ankle pain 04/14/2019   OSA (obstructive sleep apnea) 10/01/2018   Tibialis posterior tendinitis,  left 07/15/2018   Chronic pain in left foot 06/29/2018   Vitamin D deficiency disease 03/31/2018   Persistent proteinuria 03/31/2018   Hyperlipidemia LDL goal <100 03/30/2018   Chemotherapy-induced peripheral neuropathy ([place]) 12/09/2016   Asthma without status asthmaticus 01/19/2016   Hypertensive heart disease 09/26/2015   Depression with somatization 06/15/2015   Hypertriglyceridemia without hypercholesterolemia 06/15/2015   Hypokalemia 06/15/2015   Insomnia secondary to anxiety 04/05/2015   Idiopathic intracranial hypertension 10/21/2014   Upper airway cough syndrome 10/09/2014   Essential hypertension 10/09/2014   Mild obesity 10/09/2014   Apnea, sleep 05/04/2014   DOE (dyspnea on exertion) 07/14/2013   Chronic diastolic CHF (congestive heart failure) ([place]) 06/23/2013   Malignant neoplasm of upper-outer quadrant of left breast in female, estrogen receptor negative ([place]) 01/07/2013   Gastroparesis 01/04/2013   Malignant hypertension 09/17/2011   Axillary hidradenitis suppurativa 08/01/2011   Preventative health care 04/13/2011   Hyperglycemia 04/13/2011   GERD (gastroesophageal reflux disease) 03/18/2011   Cough variant asthma 09/27/2010      Laureen Abrahams, PT, DPT 09/12/20 8:47 AM     [facility] Physical Therapy [address] [place], [place], [address] Phone: [phone]   Fax:  [phone]  Name: Erica [name] MRN: [id number] Date of Birth: [birth year]

## 2020-09-13 ENCOUNTER — Encounter: Payer: Medicare Other | Admitting: Physical Therapy

## 2020-09-13 NOTE — Telephone Encounter (Signed)
Left message to call back  

## 2020-09-15 ENCOUNTER — Telehealth: Payer: Self-pay | Admitting: Cardiovascular Disease

## 2020-09-15 NOTE — Telephone Encounter (Signed)
Erica [name] from [facility] is asking for notes to be faxed from pts last visit with [place] in May w/ Dr. Oval Linsey, fax # is [phone].

## 2020-09-15 NOTE — Telephone Encounter (Signed)
Left message for Erica [name], note faxed to the number provided.

## 2020-09-20 ENCOUNTER — Ambulatory Visit (INDEPENDENT_AMBULATORY_CARE_PROVIDER_SITE_OTHER): Payer: Medicare Other | Admitting: Physical Therapy

## 2020-09-20 ENCOUNTER — Other Ambulatory Visit: Payer: Self-pay

## 2020-09-20 ENCOUNTER — Encounter: Payer: Self-pay | Admitting: Physical Therapy

## 2020-09-20 DIAGNOSIS — R293 Abnormal posture: Secondary | ICD-10-CM | POA: Diagnosis not present

## 2020-09-20 DIAGNOSIS — M6281 Muscle weakness (generalized): Secondary | ICD-10-CM | POA: Diagnosis not present

## 2020-09-20 DIAGNOSIS — R262 Difficulty in walking, not elsewhere classified: Secondary | ICD-10-CM | POA: Diagnosis not present

## 2020-09-20 DIAGNOSIS — M5412 Radiculopathy, cervical region: Secondary | ICD-10-CM

## 2020-09-20 DIAGNOSIS — M25571 Pain in right ankle and joints of right foot: Secondary | ICD-10-CM

## 2020-09-20 DIAGNOSIS — M25671 Stiffness of right ankle, not elsewhere classified: Secondary | ICD-10-CM

## 2020-09-20 DIAGNOSIS — M25572 Pain in left ankle and joints of left foot: Secondary | ICD-10-CM

## 2020-09-20 DIAGNOSIS — M25672 Stiffness of left ankle, not elsewhere classified: Secondary | ICD-10-CM | POA: Diagnosis not present

## 2020-09-20 NOTE — Therapy (Signed)
[facility] Physical Therapy [address] [place], [place], [address] Phone: [phone]   Fax:  [phone]  Physical Therapy Treatment  Patient Details  Name: Erica [name] MRN: [id number] Date of Birth: 1975/05/07 Referring Provider (PT): Gregor Hams, MD   Encounter Date: 09/20/2020   PT End of Session - 09/20/20 0850     Visit Number 808    Number of Visits 12    Date for PT Re-Evaluation 10/04/20    Authorization Type UHC Medicare    Progress Note Due on Visit 10    PT Start Time 0808    PT Stop Time 0845    PT Time Calculation (min) 37 min    Activity Tolerance Patient tolerated treatment well;No increased pain;Patient limited by fatigue    Behavior During Therapy [facility] for tasks assessed/performed             Past Medical History:  Diagnosis Date   Allergy    Anxiety    Apnea, sleep 05/04/2014   AHI 31/hr now on CPAP at 12cm H2O   Arthritis    Asthma    Asthma without status asthmaticus 01/19/2016   Axillary hidradenitis suppurativa 08/01/2011   Blood transfusion without reported diagnosis    Breast cancer ([place]) 2010   a. locally advanced left breast carcinoma diagnosed in 2010 and treated with neoadjuvant chemotherapy with docetaxel and cyclophosphamide as well as paclitaxel. This was followed by radiation therapy which was completed in 2011.   Chemotherapy-induced peripheral neuropathy ([place]) 12/09/2016   CHF (congestive heart failure) (HCC)    Chronic diastolic CHF (congestive heart failure) ([place]) 06/23/2013   Chronic kidney disease    Chronic pain in left foot 06/29/2018   Chronic stable angina ([place]) 08/22/2020   Cough variant asthma 09/27/2010   Followed in Pulmonary clinic/ [facility]/ [facility]  Onset reported in childhood  - PFT's 01/31/2011 FEV1  1.76 (60% ) ratio 68 and no better p B2,  DLCO 70% -med review 09/25/2012 > Dulera drug assistance  paperwork given    - HFA 90% 11/20/2012 .  -Med calendar 12/07/12 , 04/06/2013 , 04/18/2014,,  08/30/2014 , 11/08/2014  - PFT's 01/12/2013  1.73 (65%)  Ratio 73 and no better p B2, DLCO 86% - trial off    Depression with somatization 06/15/2015   Dyspnea on exertion    a. 01/2013 Lexi MV: EF 54%, no ischemia/infarct;  b. 05/2013 Echo: EF 60-65%, no rwma, Gr 2 DD;  b.    Essential hypertension 10/09/2014   Estimated Creatinine Clearance: 82.4 mL/min (by C-G formula based on SCr of 0.98 mg/dL).   Fatty liver    Gastroparesis    GERD (gastroesophageal reflux disease)    Hyperglycemia 04/13/2011   Hypertension    Hypertensive heart disease 09/26/2015   Hypertriglyceridemia without hypercholesterolemia 06/15/2015   Framingham risk score is 1%    Idiopathic intracranial hypertension 10/21/2014   Impaired glucose tolerance 04/13/2011   Insomnia secondary to anxiety 04/05/2015   Lymphadenitis, chronic    restricted LEFT extremity   Malignant neoplasm of upper-outer quadrant of left breast in female, estrogen receptor negative ([place]) 01/07/2013   Stage III, Invasive Ductal, s/p partial Left Mastectomy/Lumpectomy (baseball sized) with Lymph Node dissection, had Chemo and RadRx     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[place]) 09/27/2010   OSA (obstructive sleep apnea) 05/04/2014   AHI 31/hr now on CPAP at 12cm H2O   Oxygen deficiency    Uses 02 in CPAP   Persistent proteinuria 03/31/2018  Personal history of chemotherapy 2010   Personal history of radiation therapy 2010   Sleep apnea    SVT (supraventricular tachycardia) ([place]) 08/22/2020   Tibialis posterior tendinitis, left 07/15/2018   Injected in March 10, 2019   Upper airway cough syndrome 10/09/2014   Onset around 2012 with assoc pseudowheeze MRI 05/18/15 > Paranasal sinuses are clear - 11/17/2015 rec increase h1 to 2 hs to help with noct cough  - Allergy profile 11/17/2015 >  Eos 0.1 /  IgE  80 POS RAST  to grass, ragweed, cedar tree  - d/c acei 03/06/2018  - flare end of Jan 2020 in setting of ? Uri/ poorly controlled GERD - flare mid Oct 2020 with uri / assoc  secondary gerd   Vitamin D deficiency disease 03/31/2018    Past Surgical History:  Procedure Laterality Date   BRAIN SURGERY     Shunt   BREAST LUMPECTOMY Left 09/2008   CARPAL TUNNEL RELEASE Left 2011   LYMPH NODE DISSECTION Left 2010   breast; 2 wks after breast lumpectomy   RIGHT/LEFT HEART CATH AND CORONARY ANGIOGRAPHY N/A 05/04/2019   Procedure: RIGHT/LEFT HEART CATH AND CORONARY ANGIOGRAPHY;  Surgeon: Jolaine Artist, MD;  Location: [facility];  Service: Cardiovascular;  Laterality: N/A;  Renal injection preformed    TENDON RELEASE Right 2012   "hand"   TUBAL LIGATION  05/11/2011   Procedure: POST PARTUM TUBAL LIGATION;  Surgeon: Emeterio Reeve, MD;  Location: [facility];  Service: Gynecology;  Laterality: Bilateral;  Induced for HTN    There were no vitals filed for this visit.   Subjective Assessment - 09/20/20 0811     Subjective has O2 today, ankle is feeling a little better; neck is still a little painful    Limitations Standing;Walking    Patient Stated Goals improve symptoms, walk without a limp    Currently in Pain? Yes    Pain Score 4    only with walking, otherwise 0/10   Pain Location Ankle    Pain Orientation Right;Left    Pain Descriptors / Indicators Aching;Tightness    Pain Type Acute pain    Pain Onset More than a month ago    Pain Frequency Intermittent    Aggravating Factors  walking    Pain Relieving Factors meds, non-weightbearing    Pain Score 6    Pain Location Neck    Pain Orientation Right    Pain Descriptors / Indicators Numbness    Pain Type Acute pain    Pain Onset More than a month ago    Pain Frequency Intermittent    Aggravating Factors  none    Pain Relieving Factors none                               [facility] Adult PT Treatment/Exercise - 09/20/20 0812       Exercises   Exercises Neck      Neck Exercises: Seated   Shoulder Rolls Backwards;20 reps    Other Seated Exercise scapular retraction x 20 reps     Other Seated Exercise cervical rotation towel stretch 10 x 10 sec bil      Traction   Type of Traction Cervical    Min (lbs) 10    Max (lbs) 15    Hold Time 60    Rest Time 20    Time 9   with O2 - better tolerance, still needed  to end early     Ankle Exercises: Aerobic   Nustep L5 x 8 min                      PT Short Term Goals - 09/07/20 0852       PT SHORT TERM GOAL #1   Title independent with initial HEP    Time 3    Period Weeks    Status Achieved    Target Date 09/13/20               PT Long Term Goals - 09/07/20 2703       PT LONG TERM GOAL #1   Title independent with final HEP    Time 6    Period Weeks    Status On-going      PT LONG TERM GOAL #2   Title FOTO score improved to 55 for improved function    Time 6    Period Weeks    Status On-going      PT LONG TERM GOAL #3   Title perform cervical ROM without increase in pain for improved function    Time 6    Period Weeks    Status On-going      PT LONG TERM GOAL #4   Title improve Rt ankle DF AROM to at least 0 deg for improved mobility and gait    Time 6    Period Weeks    Status Achieved      PT LONG TERM GOAL #5   Title report pain < 3/10 with activity for improved pain    Time 6    Period Weeks    Status On-going                   Plan - 09/20/20 0850     Clinical Impression Statement Pt with better tolerance with traction today with O2 but still limited by SOB, and feel she may benefit from home cervical traction unit that is used sitting and inflatable cuff.  Will continue to benefit from PT to maximize function.    Personal Factors and Comorbidities Comorbidity 3+    Comorbidities hx of resistant hypertension, chronic diastolic heart failure, breast cancer s/p chemo and XRT, morbid obesity and OSA intolerant of CPAP    Examination-Activity Limitations Stairs;Carry;Stand;Transfers;Lift;Locomotion Level;Reach Overhead    Examination-Participation Restrictions  Cleaning;Community Activity;Driving;Meal Prep;Laundry    Stability/Clinical Decision Making Evolving/Moderate complexity    Rehab Potential Fair    PT Frequency 2x / week    PT Duration 6 weeks    PT Treatment/Interventions ADLs/Self Care Home Management;Cryotherapy;Electrical Stimulation;Moist Heat;Iontophoresis 4mg /ml Dexamethasone;Traction;Balance training;Therapeutic exercise;Therapeutic activities;Functional mobility training;Stair training;Gait training;Ultrasound;Neuromuscular re-education;Patient/family education;Manual techniques;Vasopneumatic Device;Taping;Dry needling;Passive range of motion    PT Next Visit Plan ankle t-band, strength, postural strength and standing balance activities; continue with neck/ankle strengthening and stretching    PT Home Exercise Plan Access Code: [email or web address]    Consulted and Agree with Plan of Care Patient             Patient will benefit from skilled therapeutic intervention in order to improve the following deficits and impairments:  Abnormal gait, Obesity, Pain, Decreased strength, Decreased activity tolerance, Decreased mobility, Difficulty walking, Decreased range of motion, Impaired flexibility, Postural dysfunction  Visit Diagnosis: Difficulty walking  Muscle weakness (generalized)  Abnormal posture  Stiffness of left ankle, not elsewhere classified  Stiffness of right ankle, not elsewhere classified  Radiculopathy, cervical region  Pain in right ankle  and joints of right foot  Pain in left ankle and joints of left foot     Problem List Patient Active Problem List   Diagnosis Date Noted   Chronic stable angina ([place]) 08/22/2020   SVT (supraventricular tachycardia) ([place]) 08/22/2020   Palpitations 02/28/2020   Right ankle pain 04/14/2019   OSA (obstructive sleep apnea) 10/01/2018   Tibialis posterior tendinitis, left 07/15/2018   Chronic pain in left foot 06/29/2018   Vitamin D deficiency disease 03/31/2018   Persistent  proteinuria 03/31/2018   Hyperlipidemia LDL goal <100 03/30/2018   Chemotherapy-induced peripheral neuropathy ([place]) 12/09/2016   Asthma without status asthmaticus 01/19/2016   Hypertensive heart disease 09/26/2015   Depression with somatization 06/15/2015   Hypertriglyceridemia without hypercholesterolemia 06/15/2015   Hypokalemia 06/15/2015   Insomnia secondary to anxiety 04/05/2015   Idiopathic intracranial hypertension 10/21/2014   Upper airway cough syndrome 10/09/2014   Essential hypertension 10/09/2014   Mild obesity 10/09/2014   Apnea, sleep 05/04/2014   DOE (dyspnea on exertion) 07/14/2013   Chronic diastolic CHF (congestive heart failure) ([place]) 06/23/2013   Malignant neoplasm of upper-outer quadrant of left breast in female, estrogen receptor negative ([place]) 01/07/2013   Gastroparesis 01/04/2013   Malignant hypertension 09/17/2011   Axillary hidradenitis suppurativa 08/01/2011   Preventative health care 04/13/2011   Hyperglycemia 04/13/2011   GERD (gastroesophageal reflux disease) 03/18/2011   Cough variant asthma 09/27/2010      Laureen Abrahams, PT, DPT 09/20/20 8:52 AM     [facility] Physical Therapy [address] [place], [place], [address] Phone: [phone]   Fax:  [phone]  Name: WELDA AZZARELLO MRN: [id number] Date of Birth: May 30, 1975

## 2020-09-22 ENCOUNTER — Other Ambulatory Visit: Payer: Self-pay

## 2020-09-22 ENCOUNTER — Encounter: Payer: Self-pay | Admitting: Rehabilitative and Restorative Service Providers"

## 2020-09-22 ENCOUNTER — Ambulatory Visit (INDEPENDENT_AMBULATORY_CARE_PROVIDER_SITE_OTHER): Payer: Medicare Other | Admitting: Rehabilitative and Restorative Service Providers"

## 2020-09-22 DIAGNOSIS — M25571 Pain in right ankle and joints of right foot: Secondary | ICD-10-CM | POA: Diagnosis not present

## 2020-09-22 DIAGNOSIS — M25672 Stiffness of left ankle, not elsewhere classified: Secondary | ICD-10-CM | POA: Diagnosis not present

## 2020-09-22 DIAGNOSIS — M25671 Stiffness of right ankle, not elsewhere classified: Secondary | ICD-10-CM

## 2020-09-22 DIAGNOSIS — M5412 Radiculopathy, cervical region: Secondary | ICD-10-CM

## 2020-09-22 DIAGNOSIS — R293 Abnormal posture: Secondary | ICD-10-CM

## 2020-09-22 DIAGNOSIS — M25572 Pain in left ankle and joints of left foot: Secondary | ICD-10-CM

## 2020-09-22 NOTE — Therapy (Signed)
[facility] [address] [place], [place], [address] Phone: [phone]   Fax:  [phone]  Physical Therapy Treatment  Patient Details  Name: Erica [name] MRN: [id number] Date of Birth: [birth year] Referring Provider (PT): Gregor Hams, MD   Encounter Date: 09/22/2020   PT End of Session - 09/22/20 1312     Visit Number 4    Number of Visits 12    Date for PT Re-Evaluation 10/04/20    Authorization Type [employer] Medicare    Progress Note Due on Visit 10    PT Start Time 1306    PT Stop Time 1340    PT Time Calculation (min) 34 min    Activity Tolerance Patient tolerated treatment well;Patient limited by fatigue    Behavior During Therapy [facility] for tasks assessed/performed             Past Medical History:  Diagnosis Date   Allergy    Anxiety    Apnea, sleep 05/04/2014   AHI 31/hr now on CPAP at 12cm H2O   Arthritis    Asthma    Asthma without status asthmaticus 01/19/2016   Axillary hidradenitis suppurativa 08/01/2011   Blood transfusion without reported diagnosis    Breast cancer ([place]) 2010   a. locally advanced left breast carcinoma diagnosed in 2010 and treated with neoadjuvant chemotherapy with docetaxel and cyclophosphamide as well as paclitaxel. This was followed by radiation therapy which was completed in 2011.   Chemotherapy-induced peripheral neuropathy ([place]) 12/09/2016   CHF (congestive heart failure) (HCC)    Chronic diastolic CHF (congestive heart failure) ([place]) 06/23/2013   Chronic kidney disease    Chronic pain in left foot 06/29/2018   Chronic stable angina ([place]) 08/22/2020   Cough variant asthma 09/27/2010   Followed in Pulmonary clinic/ [facility]/ [facility]  Onset reported in childhood  - PFT's 01/31/2011 FEV1  1.76 (60% ) ratio 68 and no better p B2,  DLCO 70% -med review 09/25/2012 > [facility] drug assistance  paperwork given    - HFA 90% 11/20/2012 .  -Med calendar 12/07/12 , 04/06/2013 , 04/18/2014,, 08/30/2014 , 11/08/2014  -  PFT's 01/12/2013  1.73 (65%)  Ratio 73 and no better p B2, DLCO 86% - trial off    Depression with somatization 06/15/2015   Dyspnea on exertion    a. 01/2013 Lexi MV: EF 54%, no ischemia/infarct;  b. 05/2013 Echo: EF 60-65%, no rwma, Gr 2 DD;  b.    Essential hypertension 10/09/2014   Estimated Creatinine Clearance: 82.4 mL/min (by C-G formula based on SCr of 0.98 mg/dL).   Fatty liver    Gastroparesis    GERD (gastroesophageal reflux disease)    Hyperglycemia 04/13/2011   Hypertension    Hypertensive heart disease 09/26/2015   Hypertriglyceridemia without hypercholesterolemia 06/15/2015   F[place]ingham risk score is 1%    Idiopathic intracranial hypertension 10/21/2014   Impaired glucose tolerance 04/13/2011   Insomnia secondary to anxiety 04/05/2015   Lymphadenitis, chronic    restricted LEFT extremity   Malignant neoplasm of upper-outer quadrant of left breast in female, estrogen receptor negative ([place]) 01/07/2013   Stage III, Invasive Ductal, s/p partial Left Mastectomy/Lumpectomy (baseball sized) with Lymph Node dissection, had Chemo and RadRx     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[place]) 09/27/2010   OSA (obstructive sleep apnea) 05/04/2014   AHI 31/hr now on CPAP at 12cm H2O   Oxygen deficiency    Uses 02 in CPAP   Persistent proteinuria 03/31/2018   Personal  history of chemotherapy 2010   Personal history of radiation therapy 2010   Sleep apnea    SVT (supraventricular tachycardia) ([place]) 08/22/2020   Tibialis posterior tendinitis, left 07/15/2018   Injected in March 10, 2019   Upper airway cough syndrome 10/09/2014   Onset around 2012 with assoc pseudowheeze MRI 05/18/15 > Paranasal sinuses are clear - 11/17/2015 rec increase h1 to 2 hs to help with noct cough  - Allergy profile 11/17/2015 >  Eos 0.1 /  IgE  80 POS RAST  to grass, ragweed, cedar tree  - d/c acei 03/06/2018  - flare end of Jan 2020 in setting of ? Uri/ poorly controlled GERD - flare mid Oct 2020 with uri / assoc secondary gerd   Vitamin D  deficiency disease 03/31/2018    Past Surgical History:  Procedure Laterality Date   BRAIN SURGERY     [place]   BREAST LUMPECTOMY Left 09/2008   CARPAL TUNNEL RELEASE Left 2011   LYMPH NODE DISSECTION Left 2010   breast; 2 wks after breast lumpectomy   RIGHT/LEFT HEART CATH AND CORONARY ANGIOGRAPHY N/A 05/04/2019   Procedure: RIGHT/LEFT HEART CATH AND CORONARY ANGIOGRAPHY;  Surgeon: Jolaine Artist, MD;  Location: [facility];  Service: Cardiovascular;  Laterality: N/A;  Renal injection preformed    TENDON RELEASE Right 2012   "hand"   TUBAL LIGATION  05/11/2011   Procedure: POST PARTUM TUBAL LIGATION;  Surgeon: Emeterio Reeve, MD;  Location: [facility];  Service: Gynecology;  Laterality: Bilateral;  Induced for HTN    There were no vitals filed for this visit.   Subjective Assessment - 09/22/20 1310     Subjective Pt. indicated pain with walking up to 6-7/10 and goes away with some resting.  Pt. indicated similar overall feelings in daily activity as last appointment.    Limitations Standing;Walking    Patient Stated Goals improve symptoms, walk without a limp    Currently in Pain? Yes    Pain Score 6     Pain Location Ankle    Pain Orientation Right    Pain Descriptors / Indicators Aching;Tightness    Pain Type Acute pain    Pain Onset More than a month ago    Pain Frequency Intermittent    Aggravating Factors  standing, walking    Pain Relieving Factors rest    Pain Score --   "not bad"   Pain Location Neck    Pain Orientation Right    Pain Type Acute pain    Pain Onset More than a month ago    Pain Frequency Intermittent    Pain Relieving Factors heating pad                [facility] PT Assessment - 09/22/20 0001       AROM   Overall AROM Comments Rt cervical pain noted c limitation to Rt at this time                           [facility] Adult PT Treatment/Exercise - 09/22/20 0001       Exercises   Other Exercises  Verbal review of existing  ankle movement from previous visits and use in HEP.  Also reviewed cervical and scapular mobility intervention.      Neck Exercises: Seated   Other Seated Exercise cervical rotation towel stretch 10 x 10 sec bil      Traction   Type of Traction Cervical  Min (lbs) 10    Max (lbs) 15    Hold Time 60    Rest Time 20      Ankle Exercises: Aerobic   Nustep Lvl 5 10 mins                      PT Short Term Goals - 09/07/20 0852       PT SHORT TERM GOAL #1   Title independent with initial HEP    Time 3    Period Weeks    Status Achieved    Target Date 09/13/20               PT Long Term Goals - 09/07/20 7494       PT LONG TERM GOAL #1   Title independent with final HEP    Time 6    Period Weeks    Status On-going      PT LONG TERM GOAL #2   Title FOTO score improved to 55 for improved function    Time 6    Period Weeks    Status On-going      PT LONG TERM GOAL #3   Title perform cervical ROM without increase in pain for improved function    Time 6    Period Weeks    Status On-going      PT LONG TERM GOAL #4   Title improve Rt ankle DF AROM to at least 0 deg for improved mobility and gait    Time 6    Period Weeks    Status Achieved      PT LONG TERM GOAL #5   Title report pain < 3/10 with activity for improved pain    Time 6    Period Weeks    Status On-going                   Plan - 09/22/20 1338     Clinical Impression Statement Continued plan of usage of traction and improving overall endurance and mobility for both ankle and cervical regions at this time.  WB pain noted mostly for ankle complaints that still limit functional mobility.  Difficulty in supine lying due to breathing troubles still noted.    Personal Factors and Comorbidities Comorbidity 3+    Comorbidities hx of resistant hypertension, chronic diastolic heart failure, breast cancer s/p chemo and XRT, morbid obesity and OSA intolerant of CPAP     Examination-Activity Limitations Stairs;Carry;Stand;Transfers;Lift;Locomotion Level;Reach Overhead    Examination-Participation Restrictions Cleaning;Community Activity;Driving;Meal Prep;Laundry    Stability/Clinical Decision Making Evolving/Moderate complexity    Rehab Potential Fair    PT Frequency 2x / week    PT Duration 6 weeks    PT Treatment/Interventions ADLs/Self Care Home Management;Cryotherapy;Electrical Stimulation;Moist Heat;Iontophoresis 4mg /ml Dexamethasone;Traction;Balance training;Therapeutic exercise;Therapeutic activities;Functional mobility training;Stair training;Gait training;Ultrasound;Neuromuscular re-education;Patient/family education;Manual techniques;Vasopneumatic Device;Taping;Dry needling;Passive range of motion    PT Next Visit Plan Adaptive traction use based off breathing, ankle mobility (possible DF mobilizations c movement) and strengthening OKC and CKC as tolerated.    PT Home Exercise Plan Access Code: [email or web address]    Consulted and Agree with Plan of Care Patient             Patient will benefit from skilled therapeutic intervention in order to improve the following deficits and impairments:  Abnormal gait, Obesity, Pain, Decreased strength, Decreased activity tolerance, Decreased mobility, Difficulty walking, Decreased range of motion, Impaired flexibility, Postural dysfunction  Visit Diagnosis: Pain in left ankle and joints  of left foot  Stiffness of left ankle, not elsewhere classified  Pain in right ankle and joints of right foot  Stiffness of right ankle, not elsewhere classified  Radiculopathy, cervical region  Abnormal posture     Problem List Patient Active Problem List   Diagnosis Date Noted   Chronic stable angina ([place]) 08/22/2020   SVT (supraventricular tachycardia) ([place]) 08/22/2020   Palpitations 02/28/2020   Right ankle pain 04/14/2019   OSA (obstructive sleep apnea) 10/01/2018   Tibialis posterior tendinitis, left 07/15/2018    Chronic pain in left foot 06/29/2018   Vitamin D deficiency disease 03/31/2018   Persistent proteinuria 03/31/2018   Hyperlipidemia LDL goal <100 03/30/2018   Chemotherapy-induced peripheral neuropathy ([place]) 12/09/2016   Asthma without status asthmaticus 01/19/2016   Hypertensive heart disease 09/26/2015   Depression with somatization 06/15/2015   Hypertriglyceridemia without hypercholesterolemia 06/15/2015   Hypokalemia 06/15/2015   Insomnia secondary to anxiety 04/05/2015   Idiopathic intracranial hypertension 10/21/2014   Upper airway cough syndrome 10/09/2014   Essential hypertension 10/09/2014   Mild obesity 10/09/2014   Apnea, sleep 05/04/2014   DOE (dyspnea on exertion) 07/14/2013   Chronic diastolic CHF (congestive heart failure) ([place]) 06/23/2013   Malignant neoplasm of upper-outer quadrant of left breast in female, estrogen receptor negative ([place]) 01/07/2013   Gastroparesis 01/04/2013   Malignant hypertension 09/17/2011   Axillary hidradenitis suppurativa 08/01/2011   Preventative health care 04/13/2011   Hyperglycemia 04/13/2011   GERD (gastroesophageal reflux disease) 03/18/2011   Cough variant asthma 09/27/2010   Scot Jun, PT, DPT, OCS, ATC 09/22/20  1:46 PM    [facility] [address] [place], [place], [address] Phone: [phone]   Fax:  [phone]  Name: Erica [name] MRN: [id number] Date of Birth: 01-05-1976

## 2020-09-27 ENCOUNTER — Encounter: Payer: Medicare Other | Admitting: Physical Therapy

## 2020-09-27 ENCOUNTER — Encounter: Payer: Self-pay | Admitting: Physical Therapy

## 2020-09-27 ENCOUNTER — Telehealth: Payer: Self-pay | Admitting: Physical Therapy

## 2020-09-27 NOTE — Telephone Encounter (Signed)
LVM about missed PT appt this morning.  Reminded of next scheduled appt and to call if she needed to cx.   Laureen Abrahams, PT, DPT 09/27/20 8:21 AM

## 2020-09-28 ENCOUNTER — Telehealth: Payer: Self-pay | Admitting: Cardiology

## 2020-09-28 NOTE — Telephone Encounter (Signed)
New message:   Webb Silversmith calling from [facility] some notes from 05/19/20 and some information. Please advise. Fax [phone]

## 2020-09-28 NOTE — Telephone Encounter (Signed)
Spoke with Webb Silversmith from [facility] [employer] who states that they need information of the patient's recommendation for oxygen usage and that she is continuously using her oxygen. She states that she is needing an addendum to the note from 5/24 with Dr. Oval Linsey. It does not look like the patient's oxygen usage was addressed at this visit.  Patient sees Dr. Radford Pax for sleep apnea. Last seen 05/09/20. Asked if they needed those notes. She states they do not need those notes. She advised that at her next appointment the patient's oxygen usage needs to be addressed and notes faxed.

## 2020-09-29 ENCOUNTER — Encounter: Payer: Medicare Other | Admitting: Physical Therapy

## 2020-10-05 ENCOUNTER — Other Ambulatory Visit: Payer: Self-pay

## 2020-10-05 ENCOUNTER — Inpatient Hospital Stay: Payer: Medicare Other | Attending: Oncology

## 2020-10-05 VITALS — BP 206/99 | HR 79 | Temp 98.9°F | Resp 20

## 2020-10-05 DIAGNOSIS — Z79818 Long term (current) use of other agents affecting estrogen receptors and estrogen levels: Secondary | ICD-10-CM | POA: Diagnosis present

## 2020-10-05 DIAGNOSIS — C50412 Malignant neoplasm of upper-outer quadrant of left female breast: Secondary | ICD-10-CM | POA: Diagnosis present

## 2020-10-05 MED ORDER — GOSERELIN ACETATE 3.6 MG ~~LOC~~ IMPL
DRUG_IMPLANT | SUBCUTANEOUS | Status: AC
  Filled 2020-10-05: qty 3.6

## 2020-10-05 MED ORDER — GOSERELIN ACETATE 3.6 MG ~~LOC~~ IMPL
3.6000 mg | DRUG_IMPLANT | SUBCUTANEOUS | Status: DC
  Administered 2020-10-05: 3.6 mg via SUBCUTANEOUS

## 2020-10-05 NOTE — Patient Instructions (Signed)
Goserelin injection What is this medication? GOSERELIN (GOE se rel in) is similar to a hormone found in the body. It lowers the amount of sex hormones that the body makes. Men will have lower testosterone levels and women will have lower estrogen levels while taking this medicine. In men, this medicine is used to treat prostate cancer; the injection is either given once per month or once every 12 weeks. A once per month injection (only) is used to treat women with endometriosis, dysfunctional uterine bleeding, or advanced breast cancer. This medicine may be used for other purposes; ask your health care provider or pharmacist if you have questions. COMMON BRAND NAME(S): Zoladex What should I tell my care team before I take this medication? They need to know if you have any of these conditions: bone problems diabetes heart disease history of irregular heartbeat an unusual or allergic reaction to goserelin, other medicines, foods, dyes, or preservatives pregnant or trying to get pregnant breast-feeding How should I use this medication? This medicine is for injection under the skin. It is given by a health care professional in a hospital or clinic setting. Talk to your pediatrician regarding the use of this medicine in children. Special care may be needed. Overdosage: If you think you have taken too much of this medicine contact a poison control center or emergency room at once. NOTE: This medicine is only for you. Do not share this medicine with others. What if I miss a dose? It is important not to miss your dose. Call your doctor or health care professional if you are unable to keep an appointment. What may interact with this medication? Do not take this medicine with any of the following medications: cisapride dronedarone pimozide thioridazine This medicine may also interact with the following medications: other medicines that prolong the QT interval (an abnormal heart rhythm) This list  may not describe all possible interactions. Give your health care provider a list of all the medicines, herbs, non-prescription drugs, or dietary supplements you use. Also tell them if you smoke, drink alcohol, or use illegal drugs. Some items may interact with your medicine. What should I watch for while using this medication? Visit your doctor or health care provider for regular checks on your progress. Your symptoms may appear to get worse during the first weeks of this therapy. Tell your doctor or healthcare provider if your symptoms do not start to get better or if they get worse after this time. Your bones may get weaker if you take this medicine for a long time. If you smoke or frequently drink alcohol you may increase your risk of bone loss. A family history of osteoporosis, chronic use of drugs for seizures (convulsions), or corticosteroids can also increase your risk of bone loss. Talk to your doctor about how to keep your bones strong. This medicine should stop regular monthly menstruation in women. Tell your doctor if you continue to menstruate. Women should not become pregnant while taking this medicine or for 12 weeks after stopping this medicine. Women should inform their doctor if they wish to become pregnant or think they might be pregnant. There is a potential for serious side effects to an unborn child. Talk to your health care professional or pharmacist for more information. Do not breast-feed an infant while taking this medicine. Men should inform their doctors if they wish to father a child. This medicine may lower sperm counts. Talk to your health care professional or pharmacist for more information. This medicine may   increase blood sugar. Ask your healthcare provider if changes in diet or medicines are needed if you have diabetes. What side effects may I notice from receiving this medication? Side effects that you should report to your doctor or health care professional as soon as  possible: allergic reactions like skin rash, itching or hives, swelling of the face, lips, or tongue bone pain breathing problems changes in vision chest pain feeling faint or lightheaded, falls fever, chills pain, swelling, warmth in the leg pain, tingling, numbness in the hands or feet signs and symptoms of high blood sugar such as being more thirsty or hungry or having to urinate more than normal. You may also feel very tired or have blurry vision signs and symptoms of low blood pressure like dizziness; feeling faint or lightheaded, falls; unusually weak or tired stomach pain swelling of the ankles, feet, hands trouble passing urine or change in the amount of urine unusually high or low blood pressure unusually weak or tired Side effects that usually do not require medical attention (report to your doctor or health care professional if they continue or are bothersome): change in sex drive or performance changes in breast size in both males and females changes in emotions or moods headache hot flashes irritation at site where injected loss of appetite skin problems like acne, dry skin vaginal dryness This list may not describe all possible side effects. Call your doctor for medical advice about side effects. You may report side effects to FDA at 1-800-FDA-1088. Where should I keep my medication? This drug is given in a hospital or clinic and will not be stored at home. NOTE: This sheet is a summary. It may not cover all possible information. If you have questions about this medicine, talk to your doctor, pharmacist, or health care provider.  2022 Elsevier/Gold Standard (2018-07-06 14:05:56)  

## 2020-10-11 ENCOUNTER — Encounter: Payer: Self-pay | Admitting: Physical Therapy

## 2020-10-11 ENCOUNTER — Ambulatory Visit (INDEPENDENT_AMBULATORY_CARE_PROVIDER_SITE_OTHER): Payer: Medicare Other | Admitting: Physical Therapy

## 2020-10-11 ENCOUNTER — Other Ambulatory Visit: Payer: Self-pay

## 2020-10-11 DIAGNOSIS — M25572 Pain in left ankle and joints of left foot: Secondary | ICD-10-CM

## 2020-10-11 DIAGNOSIS — M5412 Radiculopathy, cervical region: Secondary | ICD-10-CM

## 2020-10-11 DIAGNOSIS — M25672 Stiffness of left ankle, not elsewhere classified: Secondary | ICD-10-CM

## 2020-10-11 DIAGNOSIS — M6281 Muscle weakness (generalized): Secondary | ICD-10-CM

## 2020-10-11 DIAGNOSIS — M25571 Pain in right ankle and joints of right foot: Secondary | ICD-10-CM | POA: Diagnosis not present

## 2020-10-11 DIAGNOSIS — M25671 Stiffness of right ankle, not elsewhere classified: Secondary | ICD-10-CM | POA: Diagnosis not present

## 2020-10-11 DIAGNOSIS — R262 Difficulty in walking, not elsewhere classified: Secondary | ICD-10-CM

## 2020-10-11 DIAGNOSIS — R293 Abnormal posture: Secondary | ICD-10-CM

## 2020-10-11 NOTE — Therapy (Addendum)
[facility] [address] [place], [place], [address] Phone: [phone]   Fax:  [phone]  Physical Therapy Treatment/Recertification/Discharge Summary  Patient Details  Name: SYLIVA MEE MRN: [id number] Date of Birth: Dec 16, 1975 Referring Provider (PT): Gregor Hams, MD   Encounter Date: 10/11/2020   PT End of Session - 10/11/20 1427     Visit Number 6   updated to reflect accurate count   Number of Visits 12    Date for PT Re-Evaluation 10/04/20    Authorization Type [employer] Medicare    Progress Note Due on Visit 10    PT Start Time 1346    PT Stop Time 1425    PT Time Calculation (min) 39 min    Activity Tolerance Patient tolerated treatment well;Patient limited by fatigue    Behavior During Therapy [facility] for tasks assessed/performed             Past Medical History:  Diagnosis Date   Allergy    Anxiety    Apnea, sleep 05/04/2014   AHI 31/hr now on CPAP at 12cm H2O   Arthritis    Asthma    Asthma without status asthmaticus 01/19/2016   Axillary hidradenitis suppurativa 08/01/2011   Blood transfusion without reported diagnosis    Breast cancer ([place]) 2010   a. locally advanced left breast carcinoma diagnosed in 2010 and treated with neoadjuvant chemotherapy with docetaxel and cyclophosphamide as well as paclitaxel. This was followed by radiation therapy which was completed in 2011.   Chemotherapy-induced peripheral neuropathy ([place]) 12/09/2016   CHF (congestive heart failure) (HCC)    Chronic diastolic CHF (congestive heart failure) ([place]) 06/23/2013   Chronic kidney disease    Chronic pain in left foot 06/29/2018   Chronic stable angina ([place]) 08/22/2020   Cough variant asthma 09/27/2010   Followed in Pulmonary clinic/ [facility]/ [place]  Onset reported in childhood  - PFT's 01/31/2011 FEV1  1.76 (60% ) ratio 68 and no better p B2,  DLCO 70% -med review 09/25/2012 > [place] drug assistance  paperwork given    - HFA 90% 11/20/2012 .   -Med calendar 12/07/12 , 04/06/2013 , 04/18/2014,, 08/30/2014 , 11/08/2014  - PFT's 01/12/2013  1.73 (65%)  Ratio 73 and no better p B2, DLCO 86% - trial off    Depression with somatization 06/15/2015   Dyspnea on exertion    a. 01/2013 Lexi MV: EF 54%, no ischemia/infarct;  b. 05/2013 Echo: EF 60-65%, no rwma, Gr 2 DD;  b.    Essential hypertension 10/09/2014   Estimated Creatinine Clearance: 82.4 mL/min (by C-G formula based on SCr of 0.98 mg/dL).   Fatty liver    Gastroparesis    GERD (gastroesophageal reflux disease)    Hyperglycemia 04/13/2011   Hypertension    Hypertensive heart disease 09/26/2015   Hypertriglyceridemia without hypercholesterolemia 06/15/2015   [place] risk score is 1%    Idiopathic intracranial hypertension 10/21/2014   Impaired glucose tolerance 04/13/2011   Insomnia secondary to anxiety 04/05/2015   Lymphadenitis, chronic    restricted LEFT extremity   Malignant neoplasm of upper-outer quadrant of left breast in female, estrogen receptor negative ([place]) 01/07/2013   Stage III, Invasive Ductal, s/p partial Left Mastectomy/Lumpectomy (baseball sized) with Lymph Node dissection, had Chemo and RadRx     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[place]) 09/27/2010   OSA (obstructive sleep apnea) 05/04/2014   AHI 31/hr now on CPAP at 12cm H2O   Oxygen deficiency    Uses 02 in CPAP  Persistent proteinuria 03/31/2018   Personal history of chemotherapy 2010   Personal history of radiation therapy 2010   Sleep apnea    SVT (supraventricular tachycardia) ([place]) 08/22/2020   Tibialis posterior tendinitis, left 07/15/2018   Injected in March 10, 2019   Upper airway cough syndrome 10/09/2014   Onset around 2012 with assoc pseudowheeze MRI 05/18/15 > Paranasal sinuses are clear - 11/17/2015 rec increase h1 to 2 hs to help with noct cough  - Allergy profile 11/17/2015 >  Eos 0.1 /  IgE  80 POS RAST  to grass, ragweed, cedar tree  - d/c acei 03/06/2018  - flare end of Jan 2020 in setting of ? Uri/ poorly controlled  GERD - flare mid Oct 2020 with uri / assoc secondary gerd   Vitamin D deficiency disease 03/31/2018    Past Surgical History:  Procedure Laterality Date   BRAIN SURGERY     [place]   BREAST LUMPECTOMY Left 09/2008   CARPAL TUNNEL RELEASE Left 2011   LYMPH NODE DISSECTION Left 2010   breast; 2 wks after breast lumpectomy   RIGHT/LEFT HEART CATH AND CORONARY ANGIOGRAPHY N/A 05/04/2019   Procedure: RIGHT/LEFT HEART CATH AND CORONARY ANGIOGRAPHY;  Surgeon: Jolaine Artist, MD;  Location: [facility];  Service: Cardiovascular;  Laterality: N/A;  Renal injection preformed    TENDON RELEASE Right 2012   "hand"   TUBAL LIGATION  05/11/2011   Procedure: POST PARTUM TUBAL LIGATION;  Surgeon: Emeterio Reeve, MD;  Location: [facility];  Service: Gynecology;  Laterality: Bilateral;  Induced for HTN    There were no vitals filed for this visit.   Subjective Assessment - 10/11/20 1350     Subjective ankle is doing okay; having some neck spasms    Limitations Standing;Walking    Patient Stated Goals improve symptoms, walk without a limp    Currently in Pain? Yes    Pain Score 6     Pain Location Ankle    Pain Orientation Right    Pain Descriptors / Indicators Aching;Tightness    Pain Type Acute pain    Pain Onset More than a month ago    Pain Frequency Intermittent    Aggravating Factors  standing, walking    Pain Relieving Factors rest    Pain Score 4    Pain Location Neck    Pain Orientation Right    Pain Descriptors / Indicators Spasm    Pain Type Acute pain    Pain Onset More than a month ago    Pain Frequency Intermittent    Aggravating Factors  none    Pain Relieving Factors heat                [facility] PT Assessment - 10/11/20 1411       Assessment   Medical Diagnosis M25.572 (ICD-10-CM) - Left lateral ankle pain  M25.571 (ICD-10-CM) - Acute right ankle pain  M54.12 (ICD-10-CM) - Cervical radiculitis    Referring Provider (PT) Gregor Hams, MD      Observation/Other  Assessments   Focus on Therapeutic Outcomes (FOTO)  41      AROM   Cervical Flexion 30    Cervical Extension 18   with pain   Cervical - Right Side Bend 18   with pain   Cervical - Left Side Bend 28    Cervical - Right Rotation 52    Cervical - Left Rotation 62  [place] Adult PT Treatment/Exercise - 10/11/20 1350       Self-Care   Self-Care Other Self-Care Comments    Other Self-Care Comments  discussion about current progress/lack of progress and PT recommendations - PT to reach out to MD to discuss her care and determine next steps.      Exercises   Exercises Neck      Ankle Exercises: Aerobic   Nustep Lvl 5 10 mins      Neck Exercises: Stretches   Upper Trapezius Stretch Right;Left;1 rep;20 seconds    Levator Stretch Right;Left;1 rep;20 seconds    Other Neck Stretches towel cervical rotation stretch 2x10 sec bil                    PT Education - 10/11/20 1426     Education Details HEP    Person(s) Educated Patient    Methods Explanation;Demonstration;Handout    Comprehension Verbalized understanding;Returned demonstration;Need further instruction              PT Short Term Goals - 09/07/20 0852       PT SHORT TERM GOAL #1   Title independent with initial HEP    Time 3    Period Weeks    Status Achieved    Target Date 09/13/20               PT Long Term Goals - 10/11/20 1427       PT LONG TERM GOAL #1   Title independent with final HEP    Baseline 7/13: met to date    Time 4    Period Weeks    Status On-going    Target Date 11/08/20      PT LONG TERM GOAL #2   Title FOTO score improved to 55 for improved function    Baseline 7/13:no change in score    Time 4    Period Weeks    Status On-going    Target Date 11/08/20      PT LONG TERM GOAL #3   Title perform cervical ROM without increase in pain for improved function    Baseline 7/13: see flowsheets, minimal change    Time 4     Period Weeks    Status On-going    Target Date 11/08/20      PT LONG TERM GOAL #4   Title improve Rt ankle DF AROM to at least 0 deg for improved mobility and gait    Time 6    Period Weeks    Status Achieved      PT LONG TERM GOAL #5   Title report pain < 3/10 with activity for improved pain    Baseline 7/13: still up to 6/10    Time 4    Period Weeks    Status On-going    Target Date 11/08/20                   Plan - 10/11/20 1428     Clinical Impression Statement Pt has only met 1 LTG at this time, and has demonstrated limited progress towards LTGs.  She's had minimal improvement in pain and objective measures and at this time recommend holding PT and refering back to MD.  Will recert x 4 weeks incase continuation of PT is indicated.    Personal Factors and Comorbidities Comorbidity 3+    Comorbidities hx of resistant hypertension, chronic diastolic heart failure, breast cancer s/p chemo and XRT, morbid obesity and OSA  intolerant of CPAP    Examination-Activity Limitations Stairs;Carry;Stand;Transfers;Lift;Locomotion Level;Reach Overhead    Examination-Participation Restrictions Cleaning;Community Activity;Driving;Meal Prep;Laundry    Stability/Clinical Decision Making Evolving/Moderate complexity    Rehab Potential Fair    PT Frequency 2x / week    PT Duration 4 weeks    PT Treatment/Interventions ADLs/Self Care Home Management;Cryotherapy;Electrical Stimulation;Moist Heat;Iontophoresis 75m/ml Dexamethasone;Traction;Balance training;Therapeutic exercise;Therapeutic activities;Functional mobility training;Stair training;Gait training;Ultrasound;Neuromuscular re-education;Patient/family education;Manual techniques;Vasopneumatic Device;Taping;Dry needling;Passive range of motion    PT Next Visit Plan hold PT, see what MD says    PT Home Exercise Plan Access Code: [email or web address]   Consulted and Agree with Plan of Care Patient             Patient will benefit from skilled  therapeutic intervention in order to improve the following deficits and impairments:  Abnormal gait, Obesity, Pain, Decreased strength, Decreased activity tolerance, Decreased mobility, Difficulty walking, Decreased range of motion, Impaired flexibility, Postural dysfunction  Visit Diagnosis: Pain in left ankle and joints of left foot - Plan: PT plan of care cert/re-cert  Stiffness of left ankle, not elsewhere classified - Plan: PT plan of care cert/re-cert  Pain in right ankle and joints of right foot - Plan: PT plan of care cert/re-cert  Stiffness of right ankle, not elsewhere classified - Plan: PT plan of care cert/re-cert  Radiculopathy, cervical region - Plan: PT plan of care cert/re-cert  Abnormal posture - Plan: PT plan of care cert/re-cert  Difficulty walking - Plan: PT plan of care cert/re-cert  Muscle weakness (generalized) - Plan: PT plan of care cert/re-cert     Problem List Patient Active Problem List   Diagnosis Date Noted   Chronic stable angina ([place] 08/22/2020   SVT (supraventricular tachycardia) ([place] 08/22/2020   Palpitations 02/28/2020   Right ankle pain 04/14/2019   OSA (obstructive sleep apnea) 10/01/2018   Tibialis posterior tendinitis, left 07/15/2018   Chronic pain in left foot 06/29/2018   Vitamin D deficiency disease 03/31/2018   Persistent proteinuria 03/31/2018   Hyperlipidemia LDL goal <100 03/30/2018   Chemotherapy-induced peripheral neuropathy ([place] 12/09/2016   Asthma without status asthmaticus 01/19/2016   Hypertensive heart disease 09/26/2015   Depression with somatization 06/15/2015   Hypertriglyceridemia without hypercholesterolemia 06/15/2015   Hypokalemia 06/15/2015   Insomnia secondary to anxiety 04/05/2015   Idiopathic intracranial hypertension 10/21/2014   Upper airway cough syndrome 10/09/2014   Essential hypertension 10/09/2014   Mild obesity 10/09/2014   Apnea, sleep 05/04/2014   DOE (dyspnea on exertion) 07/14/2013    Chronic diastolic CHF (congestive heart failure) ([place] 06/23/2013   Malignant neoplasm of upper-outer quadrant of left breast in female, estrogen receptor negative ([place] 01/07/2013   Gastroparesis 01/04/2013   Malignant hypertension 09/17/2011   Axillary hidradenitis suppurativa 08/01/2011   Preventative health care 04/13/2011   Hyperglycemia 04/13/2011   GERD (gastroesophageal reflux disease) 03/18/2011   Cough variant asthma 09/27/2010      SLaureen Abrahams PT, DPT 10/11/20 3:07 PM     [facility] [address] [place] [address]Phone: [phone]  Fax:  [phone] Name: TSAMANTHAJO PAYANOMRN: [id number] of Birth: 6August 16, 1977   PHYSICAL THERAPY DISCHARGE SUMMARY  Visits from Start of Care: 6  Current functional level related to goals / functional outcomes: See above   Remaining deficits: See above   Education / Equipment: HEP   Patient agrees to discharge. Patient goals were partially met. Patient is being discharged due to lack of progress.  SAugusto Garbe  Zigmund Daniel, PT, DPT 11/13/20 2:55 PM  [facility] [address] [place], [place], [address] Phone: [phone]   Fax:  [phone]

## 2020-10-11 NOTE — Patient Instructions (Signed)
Access Code: [email or web address] URL: [email or web address] Date: 10/11/2020 Prepared by: Faustino Congress  Exercises Supine Chin Tuck - 2 x daily - 7 x weekly - 10 reps - 1 sets - 5 sec hold Seated Scapular Retraction - 2 x daily - 7 x weekly - 10 reps - 1 sets - 5 sec hold Standing Gastroc Stretch at Counter - 2 x daily - 7 x weekly - 1 sets - 4-5 reps - 30 sec hold Tandem Stance - 2 x daily - 7 x weekly - 1 sets - 5 reps - 20 second hold Heel Toe Raises with Counter Support - 2 x daily - 7 x weekly - 2-3 sets - 10 reps - 3 seconds hold Seated Upper Trapezius Stretch - 2 x daily - 7 x weekly - 3 reps - 1 sets - 30 sec hold Seated Levator Scapulae Stretch - 2 x daily - 7 x weekly - 1 sets - 3 reps - 30 sec hold Seated Assisted Cervical Rotation with Towel - 2 x daily - 7 x weekly - 1 sets - 3 reps - 5-10 sec hold

## 2020-10-22 NOTE — Progress Notes (Signed)
 Office Visit    Patient Name: Erica [name] Date of Encounter: 10/23/2020  PCP:  Jones, Thomas L, MD   [facility]  Cardiologist:  Tiffany Cloudcroft, MD  Advanced Practice Provider:  No care team member to display Electrophysiologist:  None    Chief Complaint    Erica [name] is a 45 y.o. female with a hx of resistant hypertension, chronic diastolic heart failure, breast cancer s/p chemo and XRT, morbid obesity, OSA intolerant of CPAP presents today for hypertension follow up.   Past Medical History    Past Medical History:  Diagnosis Date   Allergy    Anxiety    Apnea, sleep 05/04/2014   AHI 31/hr now on CPAP at 12cm H2O   Arthritis    Asthma    Asthma without status asthmaticus 01/19/2016   Axillary hidradenitis suppurativa 08/01/2011   Blood transfusion without reported diagnosis    Breast cancer (HCC) 2010   a. locally advanced left breast carcinoma diagnosed in 2010 and treated with neoadjuvant chemotherapy with docetaxel and cyclophosphamide as well as paclitaxel. This was followed by radiation therapy which was completed in 2011.   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  -Med calendar 12/07/12 , 04/06/2013 , 04/18/2014,, 08/30/2014 , 11/08/2014  - PFT's 01/12/2013  1.73 (65%)  Ratio 73 and no better p B2, DLCO 86% - trial off    Depression with somatization 06/15/2015   Dyspnea on exertion    a. 01/2013 Lexi MV: EF 54%, no ischemia/infarct;  b.  05/2013 Echo: EF 60-65%, no rwma, Gr 2 DD;  b.    Essential hypertension 10/09/2014   Estimated Creatinine Clearance: 82.4 mL/min (by C-G formula based on SCr of 0.98 mg/dL).   Fatty liver    Gastroparesis    GERD (gastroesophageal reflux disease)    Hyperglycemia 04/13/2011   Hypertension    Hypertensive heart disease 09/26/2015   Hypertriglyceridemia without hypercholesterolemia 06/15/2015   Framingham risk score is 1%    Idiopathic intracranial hypertension 10/21/2014   Impaired glucose tolerance 04/13/2011   Insomnia secondary to anxiety 04/05/2015   Lymphadenitis, chronic    restricted LEFT extremity   Malignant neoplasm of upper-outer quadrant of left breast in female, estrogen receptor negative (HCC) 01/07/2013   Stage III, Invasive Ductal, s/p partial Left Mastectomy/Lumpectomy (baseball sized) with Lymph Node dissection, had Chemo and RadRx     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   OSA (obstructive sleep apnea) 05/04/2014   AHI 31/hr now on CPAP at 12cm H2O   Oxygen deficiency    Uses 02 in CPAP   Persistent proteinuria 03/31/2018   Personal history of chemotherapy 2010   Personal history of radiation therapy 2010   Sleep apnea    SVT (supraventricular tachycardia) (HCC) 08/22/2020   Tibialis posterior tendinitis, left 07/15/2018   Injected in March 10, 2019   Upper airway cough syndrome 10/09/2014   Onset around 2012 with   assoc pseudowheeze MRI 05/18/15 > Paranasal sinuses are clear - 11/17/2015 rec increase h1 to 2 hs to help with noct cough  - Allergy profile 11/17/2015 >  Eos 0.1 /  IgE  80 POS RAST  to grass, ragweed, cedar tree  - d/c acei 03/06/2018  - flare end of Jan 2020 in setting of ? Uri/ poorly controlled GERD - flare mid Oct 2020 with uri / assoc secondary gerd   Vitamin D deficiency disease 03/31/2018   Past Surgical History:  Procedure Laterality Date   BRAIN SURGERY     [facility]   BREAST LUMPECTOMY Left 09/2008   CARPAL TUNNEL RELEASE Left 2011   LYMPH NODE DISSECTION  Left 2010   breast; 2 wks after breast lumpectomy   RIGHT/LEFT HEART CATH AND CORONARY ANGIOGRAPHY N/A 05/04/2019   Procedure: RIGHT/LEFT HEART CATH AND CORONARY ANGIOGRAPHY;  Surgeon: Bensimhon, Daniel R, MD;  Location: [facility];  Service: Cardiovascular;  Laterality: N/A;  Renal injection preformed    TENDON RELEASE Right 2012   "hand"   TUBAL LIGATION  05/11/2011   Procedure: POST PARTUM TUBAL LIGATION;  Surgeon: James Arnold, MD;  Location: [facility];  Service: Gynecology;  Laterality: Bilateral;  Induced for HTN    Allergies  Allergies  Allergen Reactions   Aspirin Shortness Of Breath and Palpitations    Pt can take ibuprofen without reaction   Doxycycline Anaphylaxis   Kiwi Extract Itching and Swelling    Lip swelling   Metoprolol Hives and Itching   Penicillins Shortness Of Breath and Palpitations    Has patient had a PCN reaction causing immediate rash, facial/tongue/throat swelling, SOB or lightheadedness with hypotension: Yes Has patient had a PCN reaction causing severe rash involving mucus membranes or skin necrosis: Yes Has patient had a PCN reaction that required hospitalization Yes Has patient had a PCN reaction occurring within the last 10 years: No If all of the above answers are "NO", then may proceed with Cephalosporin use.   Strawberry Extract Hives   Sulfamethoxazole-Trimethoprim Anaphylaxis    Facial, throat and tongue swelling with difficulty swallowing.    History of Present Illness    Erica [name] is a 45 y.o. female with a hx of resistant hypertension, chronic diastolic heart failure, breast cancer s/p chemo and XRT, morbid obesity, OSA intolerant of CPAP last seen 08/22/20 by Dr. Upland.  She has history of hypertension dating back to treatment of breast cancer in 2010. She has established with Dr. Holley in the hypertension clinic. Previous echocardiogram 03/2018 LVEF 45-50%, grade 2 diastolic dysfunction, mild MR, PASP 33. Renal artery duppler  2019 with bilateral 1-59% stenosis. CT-A abdomen with 1.3 cm renal cyst but no adrenal adenomas. No evidence of renal artery stenosis.   She has a history of recurrent syncope. 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 She has sleep apnea but is intolerant of CPAP and now uses BIPAP. She had R/LHC 05/2019 with finding of 60% distal LCx and 80% distal LAD disease. She was started on Clopidogrel due to allergy to asthma. LE duplex 10/2019 for lower extremity edema was negative for DVT.   There have been concerns previously about whether she is taking medications as prescribed given discrepancies between what was ordered versus picked up from the pharmacy. She reports taking medications as prescribed.   Blood pressure has been difficult to control. Doxazosin ordered at initial clinic visit. Amlodipine subsequently added. She has been given Metolazone   in addition to Torsemide due to edema. She has previously been interested in decreasing medications despite markedly elevated blood pressure. Her hydralazine was reduced to 50mg TID and doxazosin increased to 8mg twice daily to try to reduce TID dosing. Ranexa increased due to chest pain with improvement. Clonidine increased to 0.6mg QD. She wore monitor 03/23/20  showing PVC with up to 8 beats of SVT.   She was last seen 08/22/20 by Dr. Randolphe. Her BP at home was 150s-160s in setting of taking Prednisone. She was bradycardia int he 50s but asymptomatic. She was participating in PREP program at [employer]. Her Hydralazine was increased to 100mg TID. Smlodipine, Clonidine, Diltiazem, Doxazosin, HCTZ, Minoxidil, Spironolactone, Nebivolol, and Entresot were continued.   She presents today for follow up. She reports chest pain where it feels as if her heart stops beating. This lasts a couple of minutes. It is overall unchanged from when she was last seen. It is happening most days both at rest and with activity. No noted  aggravating nor relieving factors. Her chest wall is nontender on palpation. BP at home has been 140s-150s on wrist cuff which she tells me has been checked for accuracy. It is overall unchanged since her Hydralazine was changed to 100mg TID at most recent office visit. She notes dyspnea on exertion with activity such as walking around the house, bending, stairs. She has continued to go to the [employer] for exercise after completing the PREP program - she has been walking on the stair master though dose use her oxygen while doing this. She also uses her oxygen while sleeping with BIPAP. Reports compliance with BIPAP. When asked tells me she wears her oxygen "when she needs it" mostly at night and also wears it at the [employer]. Reports no orthopnea, PND. Of note she reports compliance with all of her medications but on further investigation has been out of her Spironolactone for unknown amount of time.  EKGs/Labs/Other Studies Reviewed:   The following studies were reviewed today:   Echo 03/12/18: Study Conclusions  - Left ventricle: The cavity size was normal. Wall thickness was    increased in a pattern of moderate LVH. Systolic function was    mildly reduced. The estimated ejection fraction was in the range    of 45% to 50%. Wall motion was normal; there were no regional    wall motion abnormalities. Features are consistent with a    pseudonormal left ventricular filling pattern, with concomitant    abnormal relaxation and increased filling pressure (grade 2    diastolic dysfunction).  - Mitral valve: There was mild regurgitation.  - Pulmonary arteries: Systolic pressure was mildly increased. PA    peak pressure: 33 mm Hg (S).   Lexiscan Myoview 04/20/18:   The left ventricular ejection fraction is moderately decreased (30-44%). Nuclear stress EF: 41%. There was no ST segment deviation noted during stress. This is an intermediate risk study.   Abnormal, intermediate risk stress nuclear study with  no ischemia or infarction; EF 41 with global hypokinesis and mild LVE; study intermediate risk due to reduced LV function.   Renal artery Doppler 10/29/18: Right: 1-59% stenosis of the right renal artery. Cyst(s) noted.  Left:  1-59% stenosis of the left renal artery.     LHC/RHC 05/04/19: Dist LAD-1 lesion is 30% stenosed. Dist LAD-2 lesion is 80% stenosed. Dist Cx lesion is 60% stenosed.   Findings:   Ao = 173/104 (134) LV =  181/25 RA =  10 RV =    41/13 PA =  42/10 (26) PCW = 18 Fick cardiac output/index = 5.8/2.9 PVR = 1.4 wu Ao sat = 98% PA sat = 73%, 73%   Assessment: 1. 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 LVEF 55-60% 3. Mildly elevated filling pressures with mild pulmonary venous HTN m4. Widely patent bilateral renal arteries. No evidence of AAA 5. Severe systemic HTN   Monitor 03/23/2020: 3 Day Zio Monitor   Quality: Fair.  Baseline artifact. Predominant rhythm: sinus Average heart rate: 84 bpm Max heart rate: 140 bpm Min heart rate: 57 bpm Pauses >2.5 seconds: none Occasional PVCs and ventricular bigeminy Two runs of SVT up to 8 beats.  Rate 224 bpm.   EKG:  EKG is  ordered today.  The ekg ordered today demonstrates SR 73 bpm with TWI noted in inferior leads.   Recent Labs: 02/28/2020: Hemoglobin 13.5; Platelets 247 04/10/2020: Magnesium 2.0 08/22/2020: ALT 35; BUN 14; Creatinine, Ser 1.14; Potassium 3.8; Sodium 140  Recent Lipid Panel    Component Value Date/Time   CHOL 199 08/22/2020 0912   TRIG 114 08/22/2020 0912   HDL 44 08/22/2020 0912   CHOLHDL 4.5 (H) 08/22/2020 0912   CHOLHDL 4 10/01/2018 1115   VLDL 22.6 10/01/2018 1115   LDLCALC 134 (H) 08/22/2020 0912   LDLDIRECT 152.0 03/30/2018 1604   Home Medications   Current Meds  Medication Sig   acetaZOLAMIDE (DIAMOX) 250 MG tablet Take 4 tablets (1,000 mg total) by mouth 3 (three) times daily.   albuterol (PROVENTIL) (2.5 MG/3ML) 0.083% nebulizer solution USE 1 VIAL VIA NEBULIZER EVERY 4 HOURS  AND AS NEEDED FOR WHEEZING OR SHORTNESS OF BREATH (Patient taking differently: Take 2.5 mg by nebulization every 6 (six) hours as needed for wheezing or shortness of breath.)   albuterol (VENTOLIN HFA) 108 (90 Base) MCG/ACT inhaler Inhale 2 puffs into the lungs every 6 (six) hours as needed for wheezing or shortness of breath.   AMBULATORY NON FORMULARY MEDICATION Carpal Tunnel Wrist brace. Rt  Carpal Tunnel Syndrome G56.01  Disp 1   AMBULATORY NON FORMULARY MEDICATION Lace up ankle brace (ASO type)  Ankle sprain S93.491A  Disp 1   amitriptyline (ELAVIL) 100 MG tablet TAKE 1 TABLET BY MOUTH EVERYDAY AT BEDTIME (Patient taking differently: Take 100 mg by mouth at bedtime.)   Biotin 5000 MCG TABS Take 5,000 mg by mouth 2 (two) times daily.    budesonide-formoterol (SYMBICORT) 160-4.5 MCG/ACT inhaler INHALE 2 PUFFS INTO LUNGS 2 TIMES DAILY AS DIRECTED (Patient taking differently: Inhale 2 puffs into the lungs 2 (two) times daily.)   Cholecalciferol 1.25 MG (50000 UT) capsule Take 1 capsule (50,000 Units total) by mouth once a week. (Patient taking differently: Take 4,000 Units by mouth daily. 2000 mg each  gummies)   cloNIDine (CATAPRES - DOSED IN MG/24 HR) 0.3 mg/24hr patch PLACE 2 PATCHES ONTO SKIN ONCE WEEKLY (Patient taking differently: Place 0.6 mg onto the skin See admin instructions. PLACE 2 PATCHES ONTO SKIN ONCE WEEKLY)   clopidogrel (PLAVIX) 75 MG tablet Take 1 tablet (75 mg total) by mouth daily.   clotrimazole (MYCELEX) 10 MG troche Take 1 tablet (10 mg total) by mouth 5 (five) times daily.   dexlansoprazole (DEXILANT) 60 MG capsule Take 1 capsule (60 mg total) by mouth daily.   dextromethorphan-guaiFENesin (MUCINEX DM) 30-600 MG 12hr tablet Take 2 tablets by mouth 2 (two) times daily.   doxazosin (CARDURA) 8 MG tablet Take 1 tablet (8 mg total) by mouth 2 (two) times daily. (Patient   taking differently: Take 8 mg by mouth at bedtime.)   DULoxetine (CYMBALTA) 30 MG capsule TAKE 1  CAPSULE (30 MG TOTAL) BY MOUTH DAILY. WITH 60MG FOR TOTAL OF 90 MG (Patient taking differently: Take 30 mg by mouth See admin instructions. With 60mg for total of 90 mg)   DULoxetine (CYMBALTA) 60 MG capsule TAKE 1 CAPSULE (60 MG TOTAL) BY MOUTH DAILY. FOR TOTAL OF 90 MG (Patient taking differently: Take 60 mg by mouth See admin instructions. Take with 30 mg for a total of 90 mg in the morning)   EPINEPHRINE 0.3 mg/0.3 mL IJ SOAJ injection INJECT 0.3 MLS INTO THE MUSCLE ONCE AS NEEDED FOR ANAPHYLAXIS (Patient taking differently: Inject 0.3 mg into the muscle as needed for anaphylaxis.)   famotidine (PEPCID) 20 MG tablet Take 1-2 tablets (20-40 mg total) by mouth at bedtime. (Patient taking differently: Take 40 mg by mouth at bedtime.)   gabapentin (NEURONTIN) 300 MG capsule Take 1 capsule (300 mg total) by mouth 3 (three) times daily. One three times daily (Patient taking differently: Take 300 mg by mouth 3 (three) times daily.)   hydrALAZINE (APRESOLINE) 100 MG tablet Take 1 tablet (100 mg total) by mouth 3 (three) times daily.   lidocaine-prilocaine (EMLA) cream Apply 1 application topically every 28 (twenty-eight) days. Apply 1 application to skin ever 28 days   LORazepam (ATIVAN) 0.5 MG tablet 1-2 tabs 30 - 60 min prior to MRI. Do not drive with this medicine.   metoCLOPramide (REGLAN) 10 MG tablet TAKE 1 TABLET BY MOUTH 4 TIMES DAILY. (Patient taking differently: Take 10 mg by mouth 4 (four) times daily -  before meals and at bedtime.)   minoxidil (LONITEN) 2.5 MG tablet Take 2 tablets (5 mg total) by mouth 2 (two) times daily.   montelukast (SINGULAIR) 10 MG tablet TAKE 1 TABLET BY MOUTH EVERYDAY AT BEDTIME (Patient taking differently: Take 10 mg by mouth at bedtime.)   Nebivolol HCl 20 MG TABS Take 40 mg by mouth at bedtime.   NITROSTAT 0.4 MG SL tablet PLACE 1 TABLET UNDER TONGUE EVERY 5 MINUTES AS NEEDED FOR CHEST PAIN, MAX OF 3 TABLETS THEN CALL 911 IF PAIN PERSISTS (Patient taking differently:  Place 0.4 mg under the tongue every 5 (five) minutes as needed for chest pain.)   oxymetazoline (MUCINEX SINUS-MAX FULL FORCE) 0.05 % nasal spray Place 1 spray into both nostrils 2 (two) times daily.   PEG-KCl-NaCl-NaSulf-Na Asc-C (PLENVU) 140 g SOLR Take 1 kit by mouth as directed. Manufacturer's coupon Universal coupon code:BIN: 019158; GROUP: [id number]; PCN: [id number]; ID: [id number]; PAY NO MORE $50; NO prior authorization   Potassium Chloride ER 20 MEQ TBCR TAKE 1 TABLET BY MOUTH TWICE A DAY (Patient taking differently: Take 20 mEq by mouth daily.)   primidone (MYSOLINE) 50 MG tablet Take 0.5 tablets (25 mg total) by mouth at bedtime.   promethazine (PHENERGAN) 25 MG suppository Place 25 mg rectally every 6 (six) hours as needed for nausea or vomiting.    promethazine (PHENERGAN) 25 MG tablet TAKE 1 TABLET (25 MG TOTAL) BY MOUTH 3 (THREE) TIMES DAILY AS NEEDED FOR NAUSEA OR VOMITING.   promethazine-dextromethorphan (PROMETHAZINE-DM) 6.25-15 MG/5ML syrup Take 5 mLs by mouth 4 (four) times daily as needed for cough.   ranolazine (RANEXA) 1000 MG SR tablet Take 1 tablet (1,000 mg total) by mouth 2 (two) times daily.   Respiratory Therapy Supplies (FLUTTER) DEVI Use as directed   sacubitril-valsartan (ENTRESTO) 97-103 MG Take 1 tablet by mouth 2 (  two) times daily.   Spacer/Aero-Holding Chambers (AEROCHAMBER MV) inhaler Use as instructed   torsemide (DEMADEX) 20 MG tablet Take 1 tablet (20 mg total) by mouth 2 (two) times daily.   [DISCONTINUED] ezetimibe (ZETIA) 10 MG tablet Take 1 tablet (10 mg total) by mouth daily.   [DISCONTINUED] rosuvastatin (CRESTOR) 40 MG tablet Take 1 tablet (40 mg total) by mouth daily.     Review of Systems      All other systems reviewed and are otherwise negative except as noted above.  Physical Exam    VS:  BP (!) 196/98 (BP Location: Right Arm, Patient Position: Sitting, Cuff Size: Large)   Pulse 73   Ht 5' 5" (1.651 m)   Wt 207 lb (93.9 kg)   BMI 34.45 kg/m   , BMI Body mass index is 34.45 kg/m.  Wt Readings from Last 3 Encounters:  10/23/20 207 lb (93.9 kg)  09/04/20 206 lb (93.4 kg)  08/22/20 207 lb 6.4 oz (94.1 kg)     GEN: Well nourished, well developed, in no acute distress. HEENT: normal. Neck: Supple, no JVD, carotid bruits, or masses. Cardiac: RRR, no murmurs, rubs, or gallops. No clubbing, cyanosis, edema.  Radials/PT 2+ and equal bilaterally.  Respiratory:  Respirations regular and unlabored, clear to auscultation bilaterally. GI: Soft, nontender, nondistended. MS: No deformity or atrophy. Skin: Warm and dry, no rash. Neuro:  Strength and sensation are intact. Psych: Normal affect.  Assessment & Plan    Malignant hypertension - BP not controlled. Reports home BP's 140-150 on wrist cuff. She has not been taking Spironolactone 100mg QD as prescribed for unclear reason, will resume. She will continue her current doses of Clonidine patch, Amlodipine, Ranexa, Diltiazem, Doxazosin, Hydralazine, HCTZ, Torsemide, Nebivolol, Entresto. Given persistently elevated blood pressure despite escalation of Hydralazine dose, concern related to compliance. Heart healthy diet and regular cardiovascular exercise encouraged.    Chronic stable angina / CAD - Intolerant of Imdur due to headache. Reports continued chest pain and exertional dyspnea. She is on maximum dose Ranexa. EKG today shows NSR with inferior TWI inversion. Plan for R/LHC to rule out ischemia or significant volume as contributory to chest pain and dyspnea. GDMT includes Plavix (no Aspirin due to allergy), Nebivolol, Rosuvastatin, Zetia.   Shared Decision Making/Informed Consent The risks [stroke (1 in 1000), death (1 in 1000), kidney failure [usually temporary] (1 in 500), bleeding (1 in 200), allergic reaction [possibly serious] (1 in 200)], benefits (diagnostic support and management of coronary artery disease) and alternatives of a cardiac catheterization were discussed in detail with  Ms. Melchor and she is willing to proceed.   Hypertriglyceridemia / HLD, LDL goal <70 - 08/22/20 total cholesterol 199, HDL 44, triglycerides 114, LDL 134, AST 30, ALT 35. Continue Zetia 10 QD, Rosuvastatin 40mg QD. Refills provided. Anticipate missed doses of Rosuvastatin led to elevated LDL.  OSA - BIPAP compliance encouraged. Reports wearing every evening with her oxygen. She follows with Dr. Turner in regards to her OSA. She was recommended for MSLT at last visit 05/2020, will discuss with Dr. Turner.  Asthma - Follows with pulmonology Dr. Wert. Of note she has been using oxygen while using the stairmaster at the gym due to tachycardia and shortness of breath. Plan for R/LHC, as above. She will need to follow with with pulmonology if she is requiring oxygen more than QHS. She was last seen 10/2019 by their office, recommend she schedule an appointment.  SVT - No evidence of recurrence. Continue Diltiazem 240mg   QD, Clonidine 0.66m patch,   Disposition: R/LHC. Follow up  2 weeks after cardiac cath  with Dr. ROval Linseyor APP.  Signed, CLoel Dubonnet NP 10/23/2020, 7:51 PM [place]

## 2020-10-22 NOTE — H&P (View-Only) (Signed)
Office Visit    Patient Name: Erica [name] Date of Encounter: 10/23/2020  PCP:  [name], MD   [place]  Cardiologist:  Skeet Latch, MD  Advanced Practice Provider:  No care team member to display Electrophysiologist:  None    Chief Complaint    Erica [name] is a 45 y.o. female with a hx of resistant hypertension, chronic diastolic heart failure, breast cancer s/p chemo and XRT, morbid obesity, OSA intolerant of CPAP presents today for hypertension follow up.   Past Medical History    Past Medical History:  Diagnosis Date   Allergy    Anxiety    Apnea, sleep 05/04/2014   AHI 31/hr now on CPAP at 12cm H2O   Arthritis    Asthma    Asthma without status asthmaticus 01/19/2016   Axillary hidradenitis suppurativa 08/01/2011   Blood transfusion without reported diagnosis    Breast cancer ([place]) 2010   a. locally advanced left breast carcinoma diagnosed in 2010 and treated with neoadjuvant chemotherapy with docetaxel and cyclophosphamide as well as paclitaxel. This was followed by radiation therapy which was completed in 2011.   Chemotherapy-induced peripheral neuropathy ([place]) 12/09/2016   CHF (congestive heart failure) (HCC)    Chronic diastolic CHF (congestive heart failure) ([place]) 06/23/2013   Chronic kidney disease    Chronic pain in left foot 06/29/2018   Chronic stable angina ([place]) 08/22/2020   Cough variant asthma 09/27/2010   Followed in Pulmonary clinic/ [facility]/ W[facility]  Onset reported in childhood  - PFT's 01/31/2011 FEV1  1.76 (60% ) ratio 68 and no better p B2,  DLCO 70% -med review 09/25/2012 > [place] drug assistance  paperwork given    - HFA 90% 11/20/2012 .  -Med calendar 12/07/12 , 04/06/2013 , 04/18/2014,, 08/30/2014 , 11/08/2014  - PFT's 01/12/2013  1.73 (65%)  Ratio 73 and no better p B2, DLCO 86% - trial off    Depression with somatization 06/15/2015   Dyspnea on exertion    a. 01/2013 Lexi MV: EF 54%, no ischemia/infarct;  b.  05/2013 Echo: EF 60-65%, no rwma, Gr 2 DD;  b.    Essential hypertension 10/09/2014   Estimated Creatinine Clearance: 82.4 mL/min (by C-G formula based on SCr of 0.98 mg/dL).   Fatty liver    Gastroparesis    GERD (gastroesophageal reflux disease)    Hyperglycemia 04/13/2011   Hypertension    Hypertensive heart disease 09/26/2015   Hypertriglyceridemia without hypercholesterolemia 06/15/2015   Framingham risk score is 1%    Idiopathic intracranial hypertension 10/21/2014   Impaired glucose tolerance 04/13/2011   Insomnia secondary to anxiety 04/05/2015   Lymphadenitis, chronic    restricted LEFT extremity   Malignant neoplasm of upper-outer quadrant of left breast in female, estrogen receptor negative ([place]) 01/07/2013   Stage III, Invasive Ductal, s/p partial Left Mastectomy/Lumpectomy (baseball sized) with Lymph Node dissection, had Chemo and RadRx     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[place]) 09/27/2010   OSA (obstructive sleep apnea) 05/04/2014   AHI 31/hr now on CPAP at 12cm H2O   Oxygen deficiency    Uses 02 in CPAP   Persistent proteinuria 03/31/2018   Personal history of chemotherapy 2010   Personal history of radiation therapy 2010   Sleep apnea    SVT (supraventricular tachycardia) ([place]) 08/22/2020   Tibialis posterior tendinitis, left 07/15/2018   Injected in March 10, 2019   Upper airway cough syndrome 10/09/2014   Onset around 2012 with  assoc pseudowheeze MRI 05/18/15 > Paranasal sinuses are clear - 11/17/2015 rec increase h1 to 2 hs to help with noct cough  - Allergy profile 11/17/2015 >  Eos 0.1 /  IgE  80 POS RAST  to grass, ragweed, cedar tree  - d/c acei 03/06/2018  - flare end of Jan 2020 in setting of ? Uri/ poorly controlled GERD - flare mid Oct 2020 with uri / assoc secondary gerd   Vitamin D deficiency disease 03/31/2018   Past Surgical History:  Procedure Laterality Date   BRAIN SURGERY     [place]   BREAST LUMPECTOMY Left 09/2008   CARPAL TUNNEL RELEASE Left 2011   LYMPH NODE DISSECTION  Left 2010   breast; 2 wks after breast lumpectomy   RIGHT/LEFT HEART CATH AND CORONARY ANGIOGRAPHY N/A 05/04/2019   Procedure: RIGHT/LEFT HEART CATH AND CORONARY ANGIOGRAPHY;  Surgeon: Bensimhon, Daniel R, MD;  Location: [facility]VASIVE [facility];  Service: Cardiovascular;  Laterality: N/A;  Renal injection preformed    TENDON RELEASE Right 2012   "hand"   TUBAL LIGATION  05/11/2011   Procedure: POST PARTUM TUBAL LIGATION;  Surgeon: James Arnold, MD;  Location: [facility];  Service: Gynecology;  Laterality: Bilateral;  Induced for HTN    Allergies  Allergies  Allergen Reactions   Aspirin Shortness Of Breath and Palpitations    Pt can take ibuprofen without reaction   Doxycycline Anaphylaxis   Kiwi Extract Itching and Swelling    Lip swelling   Metoprolol Hives and Itching   Penicillins Shortness Of Breath and Palpitations    Has patient had a PCN reaction causing immediate rash, facial/tongue/throat swelling, SOB or lightheadedness with hypotension: Yes Has patient had a PCN reaction causing severe rash involving mucus membranes or skin necrosis: Yes Has patient had a PCN reaction that required hospitalization Yes Has patient had a PCN reaction occurring within the last 10 years: No If all of the above answers are "NO", then may proceed with Cephalosporin use.   Strawberry Extract Hives   Sulfamethoxazole-Trimethoprim Anaphylaxis    Facial, throat and tongue swelling with difficulty swallowing.    History of Present Illness    Erica [name] is a 45 y.o. female with a hx of resistant hypertension, chronic diastolic heart failure, breast cancer s/p chemo and XRT, morbid obesity, OSA intolerant of CPAP last seen 08/22/20 by Dr. Shelley.  She has history of hypertension dating back to treatment of breast cancer in 2010. She has established with Dr. Virgil in the hypertension clinic. Previous echocardiogram 03/2018 LVEF 45-50%, grade 2 diastolic dysfunction, mild MR, PASP 33. Renal artery duppler  2019 with bilateral 1-59% stenosis. CT-A abdomen with 1.3 cm renal cyst but no adrenal adenomas. No evidence of renal artery stenosis.   She has a history of recurrent syncope. 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 She has sleep apnea but is intolerant of CPAP and now uses BIPAP. She had R/LHC 05/2019 with finding of 60% distal LCx and 80% distal LAD disease. She was started on Clopidogrel due to allergy to asthma. LE duplex 10/2019 for lower extremity edema was negative for DVT.   There have been concerns previously about whether she is taking medications as prescribed given discrepancies between what was ordered versus picked up from the pharmacy. She reports taking medications as prescribed.   Blood pressure has been difficult to control. Doxazosin ordered at initial clinic visit. Amlodipine subsequently added. She has been given Metolazone   in addition to Torsemide due to edema. She has previously been interested in decreasing medications despite markedly elevated blood pressure. Her hydralazine was reduced to 50mg TID and doxazosin increased to 8mg twice daily to try to reduce TID dosing. Ranexa increased due to chest pain with improvement. Clonidine increased to 0.6mg QD. She wore monitor 03/23/20  showing PVC with up to 8 beats of SVT.   She was last seen 08/22/20 by Dr. Randolphe. Her BP at home was 150s-160s in setting of taking Prednisone. She was bradycardia int he 50s but asymptomatic. She was participating in PREP program at [employer]. Her Hydralazine was increased to 100mg TID. Smlodipine, Clonidine, Diltiazem, Doxazosin, HCTZ, Minoxidil, Spironolactone, Nebivolol, and Entresot were continued.   She presents today for follow up. She reports chest pain where it feels as if her heart stops beating. This lasts a couple of minutes. It is overall unchanged from when she was last seen. It is happening most days both at rest and with activity. No noted  aggravating nor relieving factors. Her chest wall is nontender on palpation. BP at home has been 140s-150s on wrist cuff which she tells me has been checked for accuracy. It is overall unchanged since her Hydralazine was changed to 100mg TID at most recent office visit. She notes dyspnea on exertion with activity such as walking around the house, bending, stairs. She has continued to go to the [employer] for exercise after completing the PREP program - she has been walking on the stair master though dose use her oxygen while doing this. She also uses her oxygen while sleeping with BIPAP. Reports compliance with BIPAP. When asked tells me she wears her oxygen "when she needs it" mostly at night and also wears it at the [employer]. Reports no orthopnea, PND. Of note she reports compliance with all of her medications but on further investigation has been out of her Spironolactone for unknown amount of time.  EKGs/Labs/Other Studies Reviewed:   The following studies were reviewed today:   Echo 03/12/18: Study Conclusions  - Left ventricle: The cavity size was normal. Wall thickness was    increased in a pattern of moderate LVH. Systolic function was    mildly reduced. The estimated ejection fraction was in the range    of 45% to 50%. Wall motion was normal; there were no regional    wall motion abnormalities. Features are consistent with a    pseudonormal left ventricular filling pattern, with concomitant    abnormal relaxation and increased filling pressure (grade 2    diastolic dysfunction).  - Mitral valve: There was mild regurgitation.  - Pulmonary arteries: Systolic pressure was mildly increased. PA    peak pressure: 33 mm Hg (S).   Lexiscan Myoview 04/20/18:   The left ventricular ejection fraction is moderately decreased (30-44%). Nuclear stress EF: 41%. There was no ST segment deviation noted during stress. This is an intermediate risk study.   Abnormal, intermediate risk stress nuclear study with  no ischemia or infarction; EF 41 with global hypokinesis and mild LVE; study intermediate risk due to reduced LV function.   Renal artery Doppler 10/29/18: Right: 1-59% stenosis of the right renal artery. Cyst(s) noted.  Left:  1-59% stenosis of the left renal artery.     LHC/RHC 05/04/19: Dist LAD-1 lesion is 30% stenosed. Dist LAD-2 lesion is 80% stenosed. Dist Cx lesion is 60% stenosed.   Findings:   Ao = 173/104 (134) LV =  181/25 RA =  10 RV =    41/13 PA =  42/10 (26) PCW = 18 Fick cardiac output/index = 5.8/2.9 PVR = 1.4 wu Ao sat = 98% PA sat = 73%, 73%   Assessment: 1. 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 LVEF 55-60% 3. Mildly elevated filling pressures with mild pulmonary venous HTN m4. Widely patent bilateral renal arteries. No evidence of AAA 5. Severe systemic HTN   Monitor 03/23/2020: 3 Day Zio Monitor   Quality: Fair.  Baseline artifact. Predominant rhythm: sinus Average heart rate: 84 bpm Max heart rate: 140 bpm Min heart rate: 57 bpm Pauses >2.5 seconds: none Occasional PVCs and ventricular bigeminy Two runs of SVT up to 8 beats.  Rate 224 bpm.   EKG:  EKG is  ordered today.  The ekg ordered today demonstrates SR 73 bpm with TWI noted in inferior leads.   Recent Labs: 02/28/2020: Hemoglobin 13.5; Platelets 247 04/10/2020: Magnesium 2.0 08/22/2020: ALT 35; BUN 14; Creatinine, Ser 1.14; Potassium 3.8; Sodium 140  Recent Lipid Panel    Component Value Date/Time   CHOL 199 08/22/2020 0912   TRIG 114 08/22/2020 0912   HDL 44 08/22/2020 0912   CHOLHDL 4.5 (H) 08/22/2020 0912   CHOLHDL 4 10/01/2018 1115   VLDL 22.6 10/01/2018 1115   LDLCALC 134 (H) 08/22/2020 0912   LDLDIRECT 152.0 03/30/2018 1604   Home Medications   Current Meds  Medication Sig   acetaZOLAMIDE (DIAMOX) 250 MG tablet Take 4 tablets (1,000 mg total) by mouth 3 (three) times daily.   albuterol (PROVENTIL) (2.5 MG/3ML) 0.083% nebulizer solution USE 1 VIAL VIA NEBULIZER EVERY 4 HOURS  AND AS NEEDED FOR WHEEZING OR SHORTNESS OF BREATH (Patient taking differently: Take 2.5 mg by nebulization every 6 (six) hours as needed for wheezing or shortness of breath.)   albuterol (VENTOLIN HFA) 108 (90 Base) MCG/ACT inhaler Inhale 2 puffs into the lungs every 6 (six) hours as needed for wheezing or shortness of breath.   AMBULATORY NON FORMULARY MEDICATION Carpal Tunnel Wrist brace. Rt  Carpal Tunnel Syndrome G56.01  Disp 1   AMBULATORY NON FORMULARY MEDICATION Lace up ankle brace (ASO type)  Ankle sprain S93.491A  Disp 1   amitriptyline (ELAVIL) 100 MG tablet TAKE 1 TABLET BY MOUTH EVERYDAY AT BEDTIME (Patient taking differently: Take 100 mg by mouth at bedtime.)   Biotin 5000 MCG TABS Take 5,000 mg by mouth 2 (two) times daily.    budesonide-formoterol (SYMBICORT) 160-4.5 MCG/ACT inhaler INHALE 2 PUFFS INTO LUNGS 2 TIMES DAILY AS DIRECTED (Patient taking differently: Inhale 2 puffs into the lungs 2 (two) times daily.)   Cholecalciferol 1.25 MG (50000 UT) capsule Take 1 capsule (50,000 Units total) by mouth once a week. (Patient taking differently: Take 4,000 Units by mouth daily. 2000 mg each  gummies)   cloNIDine (CATAPRES - DOSED IN MG/24 HR) 0.3 mg/24hr patch PLACE 2 PATCHES ONTO SKIN ONCE WEEKLY (Patient taking differently: Place 0.6 mg onto the skin See admin instructions. PLACE 2 PATCHES ONTO SKIN ONCE WEEKLY)   clopidogrel (PLAVIX) 75 MG tablet Take 1 tablet (75 mg total) by mouth daily.   clotrimazole (MYCELEX) 10 MG troche Take 1 tablet (10 mg total) by mouth 5 (five) times daily.   dexlansoprazole (DEXILANT) 60 MG capsule Take 1 capsule (60 mg total) by mouth daily.   dextromethorphan-guaiFENesin (MUCINEX DM) 30-600 MG 12hr tablet Take 2 tablets by mouth 2 (two) times daily.   doxazosin (CARDURA) 8 MG tablet Take 1 tablet (8 mg total) by mouth 2 (two) times daily. (Patient   taking differently: Take 8 mg by mouth at bedtime.)   DULoxetine (CYMBALTA) 30 MG capsule TAKE 1  CAPSULE (30 MG TOTAL) BY MOUTH DAILY. WITH 60MG FOR TOTAL OF 90 MG (Patient taking differently: Take 30 mg by mouth See admin instructions. With 60mg for total of 90 mg)   DULoxetine (CYMBALTA) 60 MG capsule TAKE 1 CAPSULE (60 MG TOTAL) BY MOUTH DAILY. FOR TOTAL OF 90 MG (Patient taking differently: Take 60 mg by mouth See admin instructions. Take with 30 mg for a total of 90 mg in the morning)   EPINEPHRINE 0.3 mg/0.3 mL IJ SOAJ injection INJECT 0.3 MLS INTO THE MUSCLE ONCE AS NEEDED FOR ANAPHYLAXIS (Patient taking differently: Inject 0.3 mg into the muscle as needed for anaphylaxis.)   famotidine (PEPCID) 20 MG tablet Take 1-2 tablets (20-40 mg total) by mouth at bedtime. (Patient taking differently: Take 40 mg by mouth at bedtime.)   gabapentin (NEURONTIN) 300 MG capsule Take 1 capsule (300 mg total) by mouth 3 (three) times daily. One three times daily (Patient taking differently: Take 300 mg by mouth 3 (three) times daily.)   hydrALAZINE (APRESOLINE) 100 MG tablet Take 1 tablet (100 mg total) by mouth 3 (three) times daily.   lidocaine-prilocaine (EMLA) cream Apply 1 application topically every 28 (twenty-eight) days. Apply 1 application to skin ever 28 days   LORazepam (ATIVAN) 0.5 MG tablet 1-2 tabs 30 - 60 min prior to MRI. Do not drive with this medicine.   metoCLOPramide (REGLAN) 10 MG tablet TAKE 1 TABLET BY MOUTH 4 TIMES DAILY. (Patient taking differently: Take 10 mg by mouth 4 (four) times daily -  before meals and at bedtime.)   minoxidil (LONITEN) 2.5 MG tablet Take 2 tablets (5 mg total) by mouth 2 (two) times daily.   montelukast (SINGULAIR) 10 MG tablet TAKE 1 TABLET BY MOUTH EVERYDAY AT BEDTIME (Patient taking differently: Take 10 mg by mouth at bedtime.)   Nebivolol HCl 20 MG TABS Take 40 mg by mouth at bedtime.   NITROSTAT 0.4 MG SL tablet PLACE 1 TABLET UNDER TONGUE EVERY 5 MINUTES AS NEEDED FOR CHEST PAIN, MAX OF 3 TABLETS THEN CALL 911 IF PAIN PERSISTS (Patient taking differently:  Place 0.4 mg under the tongue every 5 (five) minutes as needed for chest pain.)   oxymetazoline (MUCINEX SINUS-MAX FULL FORCE) 0.05 % nasal spray Place 1 spray into both nostrils 2 (two) times daily.   PEG-KCl-NaCl-NaSulf-Na Asc-C (PLENVU) 140 g SOLR Take 1 kit by mouth as directed. Manufacturer's coupon Universal coupon code:BIN: 019158; GROUP: [id number]; PCN: [id number]; ID: [id number]; PAY NO MORE $50; NO prior authorization   Potassium Chloride ER 20 MEQ TBCR TAKE 1 TABLET BY MOUTH TWICE A DAY (Patient taking differently: Take 20 mEq by mouth daily.)   primidone (MYSOLINE) 50 MG tablet Take 0.5 tablets (25 mg total) by mouth at bedtime.   promethazine (PHENERGAN) 25 MG suppository Place 25 mg rectally every 6 (six) hours as needed for nausea or vomiting.    promethazine (PHENERGAN) 25 MG tablet TAKE 1 TABLET (25 MG TOTAL) BY MOUTH 3 (THREE) TIMES DAILY AS NEEDED FOR NAUSEA OR VOMITING.   promethazine-dextromethorphan (PROMETHAZINE-DM) 6.25-15 MG/5ML syrup Take 5 mLs by mouth 4 (four) times daily as needed for cough.   ranolazine (RANEXA) 1000 MG SR tablet Take 1 tablet (1,000 mg total) by mouth 2 (two) times daily.   Respiratory Therapy Supplies (FLUTTER) DEVI Use as directed   sacubitril-valsartan (ENTRESTO) 97-103 MG Take 1 tablet by mouth 2 (  two) times daily.   Spacer/Aero-Holding Chambers (AEROCHAMBER MV) inhaler Use as instructed   torsemide (DEMADEX) 20 MG tablet Take 1 tablet (20 mg total) by mouth 2 (two) times daily.   [DISCONTINUED] ezetimibe (ZETIA) 10 MG tablet Take 1 tablet (10 mg total) by mouth daily.   [DISCONTINUED] rosuvastatin (CRESTOR) 40 MG tablet Take 1 tablet (40 mg total) by mouth daily.     Review of Systems      All other systems reviewed and are otherwise negative except as noted above.  Physical Exam    VS:  BP (!) 196/98 (BP Location: Right Arm, Patient Position: Sitting, Cuff Size: Large)   Pulse 73   Ht 5' 5" (1.651 m)   Wt 207 lb (93.9 kg)   BMI 34.45 kg/m   , BMI Body mass index is 34.45 kg/m.  Wt Readings from Last 3 Encounters:  10/23/20 207 lb (93.9 kg)  09/04/20 206 lb (93.4 kg)  08/22/20 207 lb 6.4 oz (94.1 kg)     GEN: Well nourished, well developed, in no acute distress. HEENT: normal. Neck: Supple, no JVD, carotid bruits, or masses. Cardiac: RRR, no murmurs, rubs, or gallops. No clubbing, cyanosis, edema.  Radials/PT 2+ and equal bilaterally.  Respiratory:  Respirations regular and unlabored, clear to auscultation bilaterally. GI: Soft, nontender, nondistended. MS: No deformity or atrophy. Skin: Warm and dry, no rash. Neuro:  Strength and sensation are intact. Psych: Normal affect.  Assessment & Plan    Malignant hypertension - BP not controlled. Reports home BP's 140-150 on wrist cuff. She has not been taking Spironolactone 100mg QD as prescribed for unclear reason, will resume. She will continue her current doses of Clonidine patch, Amlodipine, Ranexa, Diltiazem, Doxazosin, Hydralazine, HCTZ, Torsemide, Nebivolol, Entresto. Given persistently elevated blood pressure despite escalation of Hydralazine dose, concern related to compliance. Heart healthy diet and regular cardiovascular exercise encouraged.    Chronic stable angina / CAD - Intolerant of Imdur due to headache. Reports continued chest pain and exertional dyspnea. She is on maximum dose Ranexa. EKG today shows NSR with inferior TWI inversion. Plan for R/LHC to rule out ischemia or significant volume as contributory to chest pain and dyspnea. GDMT includes Plavix (no Aspirin due to allergy), Nebivolol, Rosuvastatin, Zetia.   Shared Decision Making/Informed Consent The risks [stroke (1 in 1000), death (1 in 1000), kidney failure [usually temporary] (1 in 500), bleeding (1 in 200), allergic reaction [possibly serious] (1 in 200)], benefits (diagnostic support and management of coronary artery disease) and alternatives of a cardiac catheterization were discussed in detail with  Ms. Barnier and she is willing to proceed.   Hypertriglyceridemia / HLD, LDL goal <70 - 08/22/20 total cholesterol 199, HDL 44, triglycerides 114, LDL 134, AST 30, ALT 35. Continue Zetia 10 QD, Rosuvastatin 40mg QD. Refills provided. Anticipate missed doses of Rosuvastatin led to elevated LDL.  OSA - BIPAP compliance encouraged. Reports wearing every evening with her oxygen. She follows with Dr. Turner in regards to her OSA. She was recommended for MSLT at last visit 05/2020, will discuss with Dr. Turner.  Asthma - Follows with pulmonology Dr. Wert. Of note she has been using oxygen while using the stairmaster at the gym due to tachycardia and shortness of breath. Plan for R/LHC, as above. She will need to follow with with pulmonology if she is requiring oxygen more than QHS. She was last seen 10/2019 by their office, recommend she schedule an appointment.  SVT - No evidence of recurrence. Continue Diltiazem 240mg   QD, Clonidine 0.66m patch,   Disposition: R/LHC. Follow up  2 weeks after cardiac cath  with Dr. ROval Linseyor APP.  Signed, CLoel Dubonnet NP 10/23/2020, 7:51 PM [place]

## 2020-10-23 ENCOUNTER — Encounter: Payer: Self-pay | Admitting: Oncology

## 2020-10-23 ENCOUNTER — Other Ambulatory Visit (HOSPITAL_BASED_OUTPATIENT_CLINIC_OR_DEPARTMENT_OTHER): Payer: Self-pay | Admitting: *Deleted

## 2020-10-23 ENCOUNTER — Ambulatory Visit (INDEPENDENT_AMBULATORY_CARE_PROVIDER_SITE_OTHER): Payer: Medicare Other | Admitting: Family

## 2020-10-23 ENCOUNTER — Other Ambulatory Visit: Payer: Self-pay

## 2020-10-23 ENCOUNTER — Encounter (HOSPITAL_BASED_OUTPATIENT_CLINIC_OR_DEPARTMENT_OTHER): Payer: Self-pay | Admitting: Family

## 2020-10-23 VITALS — BP 196/98 | HR 73 | Ht 65.0 in | Wt 207.0 lb

## 2020-10-23 DIAGNOSIS — Z9189 Other specified personal risk factors, not elsewhere classified: Secondary | ICD-10-CM

## 2020-10-23 DIAGNOSIS — E785 Hyperlipidemia, unspecified: Secondary | ICD-10-CM

## 2020-10-23 DIAGNOSIS — I25118 Atherosclerotic heart disease of native coronary artery with other forms of angina pectoris: Secondary | ICD-10-CM

## 2020-10-23 DIAGNOSIS — I2089 Other forms of angina pectoris: Secondary | ICD-10-CM

## 2020-10-23 DIAGNOSIS — I119 Hypertensive heart disease without heart failure: Secondary | ICD-10-CM

## 2020-10-23 DIAGNOSIS — I10 Essential (primary) hypertension: Secondary | ICD-10-CM | POA: Diagnosis not present

## 2020-10-23 DIAGNOSIS — I2510 Atherosclerotic heart disease of native coronary artery without angina pectoris: Secondary | ICD-10-CM

## 2020-10-23 DIAGNOSIS — I5032 Chronic diastolic (congestive) heart failure: Secondary | ICD-10-CM

## 2020-10-23 DIAGNOSIS — I208 Other forms of angina pectoris: Secondary | ICD-10-CM

## 2020-10-23 DIAGNOSIS — R072 Precordial pain: Secondary | ICD-10-CM

## 2020-10-23 DIAGNOSIS — G8929 Other chronic pain: Secondary | ICD-10-CM

## 2020-10-23 MED ORDER — SPIRONOLACTONE 100 MG PO TABS: 100.0000 mg | ORAL_TABLET | Freq: Every day | ORAL | 3 refills | Status: DC

## 2020-10-23 MED ORDER — EZETIMIBE 10 MG PO TABS: 10.0000 mg | ORAL_TABLET | Freq: Every day | ORAL | 3 refills | Status: DC

## 2020-10-23 MED ORDER — ROSUVASTATIN CALCIUM 40 MG PO TABS: 40.0000 mg | ORAL_TABLET | Freq: Every day | ORAL | 3 refills | Status: DC

## 2020-10-23 MED ORDER — SODIUM CHLORIDE 0.9% FLUSH: 3.0000 mL | Freq: Two times a day (BID) | INTRAVENOUS | Status: DC

## 2020-10-23 NOTE — Patient Instructions (Addendum)
Medication Instructions:   Your physician has recommended you make the following change in your medication:   RESUME Spironolactone '100mg'$  daily   We have sent refills of your Ezetimibe (Zetia) and Rosuvastatin (Crestor) to the pharmacy.   *If you need a refill on your cardiac medications before your next appointment, please call your pharmacy*   Lab Work: Your physician recommends that you return for lab work: CBC, BMP If you have labs (blood work) drawn today and your tests are completely normal, you will receive your results only by: [place] (if you have [place]) OR A paper copy in the mail If you have any lab test that is abnormal or we need to change your treatment, we will call you to review the results.   Testing/Procedures:  Your physician has requested that you have a cardiac catheterization. Cardiac catheterization is used to diagnose and/or treat various heart conditions. Doctors may recommend this procedure for a number of different reasons. The most common reason is to evaluate chest pain. Chest pain can be a symptom of coronary artery disease (CAD), and cardiac catheterization can show whether plaque is narrowing or blocking your heart's arteries. This procedure is also used to evaluate the valves, as well as measure the blood flow and oxygen levels in different parts of your heart. Please follow instruction sheet, BELOW:        [facility] [place] [address] Dept: [phone] Loc: [phone]   MIKELE BRANIFF                      10/23/2020   1. You are scheduled for a Heart Cath, Wednesday, 10/25/2020.  Please arrive at the [facility] (Main Entrance A) at [facility]: 10:00 [address] [place], [place] [address] TBD (This time is two hours before your procedure to ensure your preparation). Free valet parking service is available.   Special  note: Everyeffort is made to have your procedure done on time. Please understand that emergencies sometimes delay scheduled procedures.   2. Diet: Do not eat solid foods after midnight.  The patient may have clear liquids until 5am upon the day of the procedure.   3. Labs: You will need to have blood drawn on TODAY.   4. Medication instructions in preparation for your procedure:   DO NOT TAKE YOUR TORSEMIDE, SPIRONOLACTONE, OR HYDROCHLOROTHIAZIDE MORNING OF PROCEDURE     Contrast Allergy: No   On the morning of your procedure, take your Plavix/Clopidogrel and any morning medicines NOT listed above.  You may use sips of water.   5. Plan for one night stay--bring personal belongings. 6. Bring a current list of your medications and current insurance cards. 7. You MUST have a responsible person to drive you home. 8. Someone MUST be with you the first 24 hours after you arrive home or your discharge will be delayed. 9. Please wear clothes that are easy to get on and off and wear slip-on shoes.   Thank you for allowing [place] to care for you!   -- [facility]   Follow-Up: At [facility], you and your health needs are our priority.  As part of our continuing mission to provide you with exceptional heart care, we have created designated Provider Care Teams.  These Care Teams include your primary Cardiologist (physician) and Advanced Practice Providers (APPs -  Physician Assistants and Nurse Practitioners)  who all work together to provide you with the care you need, when you need it.  We recommend signing up for the patient portal called "MyChart".  Sign up information is provided on this After Visit Summary.  MyChart is used to connect with patients for Virtual Visits (Telemedicine).  Patients are able to view lab/test results, encounter notes, upcoming appointments, etc.  Non-urgent messages can be sent to your provider as well.   To learn more about what you can  do with MyChart, go to [email or web address].    Your next appointment:   2 weeks after cardiac catheterization  11/13/2020 ARRIVE AT 1:45  The format for your next appointment:   In Person  Provider:   Skeet Latch, MD   Other Instructions

## 2020-10-24 ENCOUNTER — Telehealth: Payer: Self-pay | Admitting: *Deleted

## 2020-10-24 LAB — BASIC METABOLIC PANEL
BUN/Creatinine Ratio: 15 (ref 9–23)
BUN: 16 mg/dL (ref 6–24)
CO2: 26 mmol/L (ref 20–29)
Calcium: 9.6 mg/dL (ref 8.7–10.2)
Chloride: 103 mmol/L (ref 96–106)
Creatinine, Ser: 1.07 mg/dL — ABNORMAL HIGH (ref 0.57–1.00)
Glucose: 95 mg/dL (ref 65–99)
Potassium: 4.3 mmol/L (ref 3.5–5.2)
Sodium: 144 mmol/L (ref 134–144)
eGFR: 64 mL/min/{1.73_m2} (ref >59)

## 2020-10-24 LAB — CBC
Hematocrit: 42.5 % (ref 34.0–46.6)
Hemoglobin: 13.8 g/dL (ref 11.1–15.9)
MCH: 28.8 pg (ref 26.6–33.0)
MCHC: 32.5 g/dL (ref 31.5–35.7)
MCV: 89 fL (ref 79–97)
Platelets: 218 10*3/uL (ref 150–450)
RBC: 4.8 x10E6/uL (ref 3.77–5.28)
RDW: 13.7 % (ref 11.7–15.4)
WBC: 6.6 10*3/uL (ref 3.4–10.8)

## 2020-10-24 NOTE — Telephone Encounter (Addendum)
Pt contacted pre-catheterization scheduled at [facility] for: Wednesday October 25, 2020 12 Noon Verified arrival time and place: [facility]) at: 10 AM   No solid food after midnight prior to cath, clear liquids until 5 AM day of procedure.  Hold: HCTZ/Spironolactone/Torsemide/KCl-AM of procedure  Except hold medications AM meds can be  taken pre-cath with sips of water including: Plavix 75 mg   Confirmed patient has responsible adult to drive home post procedure and be with patient first 24 hours after arriving home: yes  You are allowed ONE visitor in the waiting room during the time you are at the hospital for your procedure. Both you and your visitor must wear a mask once you enter the hospital.   Patient reports does not currently have any symptoms concerning for COVID-19 and no household members with COVID-19 like illness.      Reviewed procedure/mask/visitor instructions with patient.             Patient reports she went this morning to [employer] for [employer] they are not able to run these STAT.

## 2020-10-25 ENCOUNTER — Encounter (HOSPITAL_COMMUNITY)
Admission: RE | Disposition: A | Discharge status: Home / Self Care | Payer: Self-pay | Source: Home / Self Care | Attending: Cardiology

## 2020-10-25 ENCOUNTER — Encounter (HOSPITAL_BASED_OUTPATIENT_CLINIC_OR_DEPARTMENT_OTHER): Payer: Self-pay

## 2020-10-25 ENCOUNTER — Other Ambulatory Visit: Payer: Self-pay

## 2020-10-25 ENCOUNTER — Ambulatory Visit (HOSPITAL_COMMUNITY)
Admission: RE | Admit: 2020-10-25 | Discharge: 2020-10-25 | Disposition: A | Discharge status: Home / Self Care | Payer: Medicare Other | Attending: Cardiology | Admitting: Cardiology

## 2020-10-25 DIAGNOSIS — I110 Hypertensive heart disease with heart failure: Secondary | ICD-10-CM | POA: Insufficient documentation

## 2020-10-25 DIAGNOSIS — R079 Chest pain, unspecified: Secondary | ICD-10-CM

## 2020-10-25 DIAGNOSIS — I25119 Atherosclerotic heart disease of native coronary artery with unspecified angina pectoris: Secondary | ICD-10-CM | POA: Insufficient documentation

## 2020-10-25 DIAGNOSIS — Z79899 Other long term (current) drug therapy: Secondary | ICD-10-CM | POA: Diagnosis not present

## 2020-10-25 DIAGNOSIS — Z7902 Long term (current) use of antithrombotics/antiplatelets: Secondary | ICD-10-CM | POA: Insufficient documentation

## 2020-10-25 DIAGNOSIS — Z881 Allergy status to other antibiotic agents status: Secondary | ICD-10-CM | POA: Insufficient documentation

## 2020-10-25 DIAGNOSIS — I10 Essential (primary) hypertension: Secondary | ICD-10-CM

## 2020-10-25 DIAGNOSIS — I208 Other forms of angina pectoris: Secondary | ICD-10-CM

## 2020-10-25 DIAGNOSIS — G4733 Obstructive sleep apnea (adult) (pediatric): Secondary | ICD-10-CM | POA: Insufficient documentation

## 2020-10-25 DIAGNOSIS — I25118 Atherosclerotic heart disease of native coronary artery with other forms of angina pectoris: Secondary | ICD-10-CM | POA: Diagnosis not present

## 2020-10-25 DIAGNOSIS — E781 Pure hyperglyceridemia: Secondary | ICD-10-CM | POA: Insufficient documentation

## 2020-10-25 DIAGNOSIS — I2510 Atherosclerotic heart disease of native coronary artery without angina pectoris: Secondary | ICD-10-CM

## 2020-10-25 DIAGNOSIS — Z880 Allergy status to penicillin: Secondary | ICD-10-CM | POA: Diagnosis not present

## 2020-10-25 DIAGNOSIS — E785 Hyperlipidemia, unspecified: Secondary | ICD-10-CM | POA: Diagnosis not present

## 2020-10-25 DIAGNOSIS — Z882 Allergy status to sulfonamides status: Secondary | ICD-10-CM | POA: Insufficient documentation

## 2020-10-25 DIAGNOSIS — I5032 Chronic diastolic (congestive) heart failure: Secondary | ICD-10-CM

## 2020-10-25 DIAGNOSIS — R0600 Dyspnea, unspecified: Secondary | ICD-10-CM

## 2020-10-25 DIAGNOSIS — Z888 Allergy status to other drugs, medicaments and biological substances status: Secondary | ICD-10-CM | POA: Diagnosis not present

## 2020-10-25 DIAGNOSIS — Z886 Allergy status to analgesic agent status: Secondary | ICD-10-CM | POA: Diagnosis not present

## 2020-10-25 DIAGNOSIS — G8929 Other chronic pain: Secondary | ICD-10-CM

## 2020-10-25 DIAGNOSIS — J45909 Unspecified asthma, uncomplicated: Secondary | ICD-10-CM | POA: Insufficient documentation

## 2020-10-25 DIAGNOSIS — R002 Palpitations: Secondary | ICD-10-CM

## 2020-10-25 DIAGNOSIS — I471 Supraventricular tachycardia: Secondary | ICD-10-CM | POA: Diagnosis not present

## 2020-10-25 DIAGNOSIS — I1A0 Resistant hypertension: Secondary | ICD-10-CM

## 2020-10-25 HISTORY — PX: RIGHT/LEFT HEART CATH AND CORONARY ANGIOGRAPHY: CATH118266

## 2020-10-25 LAB — POCT I-STAT 7, (LYTES, BLD GAS, ICA,H+H)
Acid-Base Excess: 1 mmol/L (ref 0.0–2.0)
Acid-Base Excess: 1 mmol/L (ref 0.0–2.0)
Bicarbonate: 24.5 mmol/L (ref 20.0–28.0)
Bicarbonate: 25.6 mmol/L (ref 20.0–28.0)
Calcium, Ion: 1.21 mmol/L (ref 1.15–1.40)
Calcium, Ion: 1.26 mmol/L (ref 1.15–1.40)
HCT: 38 % (ref 36.0–46.0)
HCT: 40 % (ref 36.0–46.0)
Hemoglobin: 12.9 g/dL (ref 12.0–15.0)
Hemoglobin: 13.6 g/dL (ref 12.0–15.0)
O2 Saturation: 81 %
O2 Saturation: 99 %
Potassium: 3.5 mmol/L (ref 3.5–5.1)
Potassium: 3.5 mmol/L (ref 3.5–5.1)
Sodium: 142 mmol/L (ref 135–145)
Sodium: 143 mmol/L (ref 135–145)
TCO2: 26 mmol/L (ref 22–32)
TCO2: 27 mmol/L (ref 22–32)
pCO2 arterial: 34.8 mmHg (ref 32.0–48.0)
pCO2 arterial: 39.8 mmHg (ref 32.0–48.0)
pH, Arterial: 7.416 (ref 7.350–7.450)
pH, Arterial: 7.456 — ABNORMAL HIGH (ref 7.350–7.450)
pO2, Arterial: 132 mmHg — ABNORMAL HIGH (ref 83.0–108.0)
pO2, Arterial: 45 mmHg — ABNORMAL LOW (ref 83.0–108.0)

## 2020-10-25 LAB — POCT ACTIVATED CLOTTING TIME: Activated Clotting Time: 161 seconds

## 2020-10-25 SURGERY — RIGHT/LEFT HEART CATH AND CORONARY ANGIOGRAPHY
Anesthesia: LOCAL

## 2020-10-25 MED ORDER — CLOPIDOGREL BISULFATE 75 MG PO TABS: 75.0000 mg | ORAL_TABLET | ORAL | Status: DC

## 2020-10-25 MED ORDER — LIDOCAINE HCL (PF) 1 % IJ SOLN
INTRAMUSCULAR | Status: AC
  Filled 2020-10-25: qty 30

## 2020-10-25 MED ORDER — SODIUM CHLORIDE 0.9 % WEIGHT BASED INFUSION: 1.0000 mL/kg/h | INTRAVENOUS | Status: DC

## 2020-10-25 MED ORDER — LIDOCAINE HCL (PF) 1 % IJ SOLN
INTRAMUSCULAR | Status: DC | PRN
  Administered 2020-10-25 (×2): 2 mL

## 2020-10-25 MED ORDER — VERAPAMIL HCL 2.5 MG/ML IV SOLN
INTRAVENOUS | Status: DC | PRN
  Administered 2020-10-25: 10 mL via INTRA_ARTERIAL

## 2020-10-25 MED ORDER — SODIUM CHLORIDE 0.9 % IV SOLN: 250.0000 mL | INTRAVENOUS | Status: DC | PRN

## 2020-10-25 MED ORDER — SODIUM CHLORIDE 0.9 % IV SOLN: INTRAVENOUS | Status: DC

## 2020-10-25 MED ORDER — NITROGLYCERIN 0.4 MG SL SUBL: 0.4000 mg | SUBLINGUAL_TABLET | SUBLINGUAL | 2 refills | Status: DC | PRN

## 2020-10-25 MED ORDER — SODIUM CHLORIDE 0.9 % WEIGHT BASED INFUSION
3.0000 mL/kg/h | INTRAVENOUS | Status: AC
  Administered 2020-10-25: 3 mL/kg/h via INTRAVENOUS

## 2020-10-25 MED ORDER — FUROSEMIDE 10 MG/ML IJ SOLN
40.0000 mg | Freq: Once | INTRAMUSCULAR | Status: AC
  Administered 2020-10-25: 40 mg via INTRAVENOUS
  Filled 2020-10-25: qty 4

## 2020-10-25 MED ORDER — ACETAMINOPHEN 325 MG PO TABS
650.0000 mg | ORAL_TABLET | ORAL | Status: DC | PRN
  Administered 2020-10-25: 650 mg via ORAL
  Filled 2020-10-25: qty 2

## 2020-10-25 MED ORDER — MIDAZOLAM HCL 2 MG/2ML IJ SOLN
INTRAMUSCULAR | Status: AC
  Filled 2020-10-25: qty 2

## 2020-10-25 MED ORDER — HYDRALAZINE HCL 20 MG/ML IJ SOLN
INTRAMUSCULAR | Status: DC | PRN
  Administered 2020-10-25 (×2): 10 mg via INTRAVENOUS

## 2020-10-25 MED ORDER — HYDRALAZINE HCL 20 MG/ML IJ SOLN
INTRAMUSCULAR | Status: AC
  Filled 2020-10-25: qty 1

## 2020-10-25 MED ORDER — SODIUM CHLORIDE 0.9% FLUSH: 3.0000 mL | INTRAVENOUS | Status: DC | PRN

## 2020-10-25 MED ORDER — HYDRALAZINE HCL 20 MG/ML IJ SOLN: 20.0000 mg | INTRAMUSCULAR | Status: DC | PRN

## 2020-10-25 MED ORDER — ONDANSETRON HCL 4 MG/2ML IJ SOLN: 4.0000 mg | Freq: Four times a day (QID) | INTRAMUSCULAR | Status: DC | PRN

## 2020-10-25 MED ORDER — HEPARIN SODIUM (PORCINE) 1000 UNIT/ML IJ SOLN
INTRAMUSCULAR | Status: AC
  Filled 2020-10-25: qty 1

## 2020-10-25 MED ORDER — MIDAZOLAM HCL 2 MG/2ML IJ SOLN
INTRAMUSCULAR | Status: DC | PRN
  Administered 2020-10-25 (×2): 1 mg via INTRAVENOUS

## 2020-10-25 MED ORDER — HEPARIN (PORCINE) IN NACL 1000-0.9 UT/500ML-% IV SOLN
INTRAVENOUS | Status: DC | PRN
  Administered 2020-10-25 (×2): 500 mL

## 2020-10-25 MED ORDER — SODIUM CHLORIDE 0.9% FLUSH: 3.0000 mL | Freq: Two times a day (BID) | INTRAVENOUS | Status: DC

## 2020-10-25 MED ORDER — HEPARIN (PORCINE) IN NACL 1000-0.9 UT/500ML-% IV SOLN
INTRAVENOUS | Status: AC
  Filled 2020-10-25: qty 1000

## 2020-10-25 MED ORDER — HEPARIN SODIUM (PORCINE) 1000 UNIT/ML IJ SOLN
INTRAMUSCULAR | Status: DC | PRN
  Administered 2020-10-25: 4500 [IU] via INTRAVENOUS

## 2020-10-25 MED ORDER — IOHEXOL 350 MG/ML SOLN
INTRAVENOUS | Status: DC | PRN
  Administered 2020-10-25: 75 mL

## 2020-10-25 MED ORDER — VERAPAMIL HCL 2.5 MG/ML IV SOLN
INTRAVENOUS | Status: AC
  Filled 2020-10-25: qty 2

## 2020-10-25 MED ORDER — VERAPAMIL HCL 2.5 MG/ML IV SOLN
INTRAVENOUS | Status: DC | PRN
  Administered 2020-10-25: 2 mg via INTRAVENOUS

## 2020-10-25 SURGICAL SUPPLY — 12 items

## 2020-10-25 NOTE — Brief Op Note (Signed)
10/25/2020  12:53 PM  Referring Cardiology Team: Laurann Montana, NP; Skeet Latch, MD  PROCEDURE:  Procedure(s): RIGHT/LEFT HEART CATH AND CORONARY ANGIOGRAPHY (N/A)  SURGEON:  Surgeon(s) and Role:    * Leonie Man, MD - Primary  PATIENT:  Erica [name]  45 y.o. female with known CAD (distal LAD 80% stenosis (apical), malignant/accelerated hypertension with Chronic Evidence Heart Disease with Chronic HFpEF who has been having intermittent episodes of chest discomfort and worsening dyspnea.  She has very difficult control hypertension.  She is referred for right left heart cath to exclude progression of CAD and to assess pulmonary pressures.  PRE-OPERATIVE DIAGNOSIS:  angina/accelerated hypertension  POST-OPERATIVE DIAGNOSIS:   Stable single-vessel CAD with apical LAD 80% stenosis-no change from previous cath.  Otherwise very tortuous coronary arteries with no significant stenoses. Mild (likely secondary) Pulmonary Hypertension with PA mean pressure of 28 mmHg (PAP 39/15 mmHg), and PCWP of 19 mmHg with LVEDP of 25 mmHg. Significant systolic hypertension with initial pressures in the 200s over 110s, however after medication 152/78 mmHg with MAP of 109 mmHg; LVP/EDP 155/13 mmHg - 25 mmHg. Ao sat 99%, PA sat 81%.  Fick Cardiac Output-Index: 7.91-3.84 (normal) Suspect angina is due to accelerated hypertension, increased wall stress and microvascular ischemia.  Distal LAD lesion is not favorable for PCI (however if pain is intractable, could be considered)   Time Out: Verified patient identification, verified procedure, site/side was marked, verified correct patient position, special equipment/implants available, medications/allergies/relevent history reviewed, required imaging and test results available. Performed.  Access:  * Right radial Artery: 6 Fr sheath -- Seldinger technique using Micropuncture Kit -- Direct ultrasound guidance used.  Permanent image obtained and placed on  chart. -- 10 mL radial cocktail IA; 4500 units IV Heparin  * Right Brachial/Antecubital Vein: The existing 18-gauge IV was exchanged over a wire for a 5Fr  sheath  Right Heart Catheterization: 5 Fr Gordy Councilman catheter advanced under fluoroscopy with balloon inflated to the RA, RV, then PCWP-PA for hemodynamic measurement.  * Simultaneous FA & PA blood gases checked for SaO2% to calculate FICK CO/CI  * Thermodilution Injections performed to calculate CO/CI  Simultaneous PCWP/LV & RV/LV pressures monitored with Angled Pigtail in LV.  * Catheter removed completely out of the body with balloon deflated.  Left Heart Catheterization: 5Fr Catheters advanced or exchanged over a J-wire under direct fluoroscopic guidance into the ascending aorta; JR4 catheter advanced first.  * LV Hemodynamics (LV Gram): JR4 Catheter * Left Coronary Artery Cineangiography: JL3.5 Catheter  * Right Coronary Artery Cineangiography: JR4 Catheter   Review of initial angiography revealed: Stable CAD, mild pulmonary pretension  Upon completion of Angiogaphy, the catheter was removed completely out of the body over a wire, without complication. Brachial Sheath(s) removed in the Cath Lab  with manual pressure for hemostasis.    Radial sheath removed in the Cardiac Catheterization lab with TR Band placed for hemostasis.  TR Band: 1225  Hours; 13 mL air  MEDICATIONS ANESTHESIA:   local and IV sedation;  3 mL lidocaine for radial, 1 mL for brachial access;  2 mg IV Versed 75 mcg fentanyl Radial Cocktail: 3 mg Verapmil in 10 mL NS Heparin: 4500 Units IV hydralazine 10 mg x 2; IV verapamil 2 mg  EBL:  <20 mL  DICTATION: .Note written in EPIC  PLAN OF CARE: Discharge to home after PACU Will dose 40 mg IV Lasix given elevated filling pressures and need for heavy hydration. Recommend that she takes torsemide  40 mg in the a.m., 20 mg p.m. x3 days. For chest pain, recommend as needed nitroglycerin-if used, recommend  taking additional dose of torsemide with nitroglycerin.  PATIENT DISPOSITION:  PACU - hemodynamically stable.   Delay start of Pharmacological VTE agent (>24hrs) due to surgical blood loss or risk of bleeding: not applicable    Glenetta Hew, MD

## 2020-10-25 NOTE — Telephone Encounter (Signed)
Duplicate encounter. See separate MyChart encounter 10/25/20. Refill and instructions provided.   Loel Dubonnet, NP

## 2020-10-25 NOTE — Interval H&P Note (Signed)
History and Physical Interval Note:  10/25/2020 11:29 AM  Erica [name]  has presented today for surgery, with the diagnosis of angina & accelerated HTN  The various methods of treatment have been discussed with the patient and family. After consideration of risks, benefits and other options for treatment, the patient has consented to  Procedure(s): RIGHT/LEFT HEART CATH AND CORONARY ANGIOGRAPHY (N/A)  PERCUTANEOUS CORONARY INTERVENTION  as a surgical intervention.  The patient's history has been reviewed, patient examined, no change in status, stable for surgery.  I have reviewed the patient's chart and labs.  Questions were answered to the patient's satisfaction.     Cath Lab Visit (complete for each Cath Lab visit)  Clinical Evaluation Leading to the Procedure:   ACS: No.  Non-ACS:    Anginal Classification: CCS III  Anti-ischemic medical therapy: Maximal Therapy (2 or more classes of medications)  Non-Invasive Test Results: No non-invasive testing performed  Prior CABG: No previous CABG    Glenetta Hew

## 2020-10-26 ENCOUNTER — Encounter (HOSPITAL_COMMUNITY): Payer: Self-pay | Admitting: Cardiology

## 2020-11-01 MED ORDER — CLOPIDOGREL BISULFATE 75 MG PO TABS: 75.0000 mg | ORAL_TABLET | Freq: Every day | ORAL | 3 refills | Status: DC

## 2020-11-01 NOTE — Telephone Encounter (Signed)
Patient seen in office by NP 7/25

## 2020-11-01 NOTE — Addendum Note (Signed)
Addended by: Loel Dubonnet on: 11/01/2020 09:55 AM   Modules accepted: Orders

## 2020-11-02 ENCOUNTER — Inpatient Hospital Stay: Payer: Medicare Other | Attending: Oncology

## 2020-11-02 ENCOUNTER — Other Ambulatory Visit: Payer: Self-pay

## 2020-11-02 VITALS — BP 170/94 | HR 80 | Temp 98.8°F | Resp 18

## 2020-11-02 DIAGNOSIS — C50412 Malignant neoplasm of upper-outer quadrant of left female breast: Secondary | ICD-10-CM

## 2020-11-02 DIAGNOSIS — Z79818 Long term (current) use of other agents affecting estrogen receptors and estrogen levels: Secondary | ICD-10-CM | POA: Diagnosis present

## 2020-11-02 MED ORDER — GOSERELIN ACETATE 3.6 MG ~~LOC~~ IMPL
3.6000 mg | DRUG_IMPLANT | SUBCUTANEOUS | Status: DC
  Administered 2020-11-02: 3.6 mg via SUBCUTANEOUS

## 2020-11-02 MED ORDER — GOSERELIN ACETATE 3.6 MG ~~LOC~~ IMPL
DRUG_IMPLANT | SUBCUTANEOUS | Status: AC
  Filled 2020-11-02: qty 3.6

## 2020-11-02 NOTE — Patient Instructions (Signed)
Goserelin injection What is this medication? GOSERELIN (GOE se rel in) is similar to a hormone found in the body. It lowers the amount of sex hormones that the body makes. Men will have lower testosterone levels and women will have lower estrogen levels while taking this medicine. In men, this medicine is used to treat prostate cancer; the injection is either given once per month or once every 12 weeks. A once per month injection (only) is used to treat women with endometriosis, dysfunctional uterine bleeding, or advanced breast cancer. This medicine may be used for other purposes; ask your health care provider or pharmacist if you have questions. COMMON BRAND NAME(S): Zoladex What should I tell my care team before I take this medication? They need to know if you have any of these conditions: bone problems diabetes heart disease history of irregular heartbeat an unusual or allergic reaction to goserelin, other medicines, foods, dyes, or preservatives pregnant or trying to get pregnant breast-feeding How should I use this medication? This medicine is for injection under the skin. It is given by a health care professional in a hospital or clinic setting. Talk to your pediatrician regarding the use of this medicine in children. Special care may be needed. Overdosage: If you think you have taken too much of this medicine contact a poison control center or emergency room at once. NOTE: This medicine is only for you. Do not share this medicine with others. What if I miss a dose? It is important not to miss your dose. Call your doctor or health care professional if you are unable to keep an appointment. What may interact with this medication? Do not take this medicine with any of the following medications: cisapride dronedarone pimozide thioridazine This medicine may also interact with the following medications: other medicines that prolong the QT interval (an abnormal heart rhythm) This list  may not describe all possible interactions. Give your health care provider a list of all the medicines, herbs, non-prescription drugs, or dietary supplements you use. Also tell them if you smoke, drink alcohol, or use illegal drugs. Some items may interact with your medicine. What should I watch for while using this medication? Visit your doctor or health care provider for regular checks on your progress. Your symptoms may appear to get worse during the first weeks of this therapy. Tell your doctor or healthcare provider if your symptoms do not start to get better or if they get worse after this time. Your bones may get weaker if you take this medicine for a long time. If you smoke or frequently drink alcohol you may increase your risk of bone loss. A family history of osteoporosis, chronic use of drugs for seizures (convulsions), or corticosteroids can also increase your risk of bone loss. Talk to your doctor about how to keep your bones strong. This medicine should stop regular monthly menstruation in women. Tell your doctor if you continue to menstruate. Women should not become pregnant while taking this medicine or for 12 weeks after stopping this medicine. Women should inform their doctor if they wish to become pregnant or think they might be pregnant. There is a potential for serious side effects to an unborn child. Talk to your health care professional or pharmacist for more information. Do not breast-feed an infant while taking this medicine. Men should inform their doctors if they wish to father a child. This medicine may lower sperm counts. Talk to your health care professional or pharmacist for more information. This medicine may   increase blood sugar. Ask your healthcare provider if changes in diet or medicines are needed if you have diabetes. What side effects may I notice from receiving this medication? Side effects that you should report to your doctor or health care professional as soon as  possible: allergic reactions like skin rash, itching or hives, swelling of the face, lips, or tongue bone pain breathing problems changes in vision chest pain feeling faint or lightheaded, falls fever, chills pain, swelling, warmth in the leg pain, tingling, numbness in the hands or feet signs and symptoms of high blood sugar such as being more thirsty or hungry or having to urinate more than normal. You may also feel very tired or have blurry vision signs and symptoms of low blood pressure like dizziness; feeling faint or lightheaded, falls; unusually weak or tired stomach pain swelling of the ankles, feet, hands trouble passing urine or change in the amount of urine unusually high or low blood pressure unusually weak or tired Side effects that usually do not require medical attention (report to your doctor or health care professional if they continue or are bothersome): change in sex drive or performance changes in breast size in both males and females changes in emotions or moods headache hot flashes irritation at site where injected loss of appetite skin problems like acne, dry skin vaginal dryness This list may not describe all possible side effects. Call your doctor for medical advice about side effects. You may report side effects to FDA at 1-800-FDA-1088. Where should I keep my medication? This drug is given in a hospital or clinic and will not be stored at home. NOTE: This sheet is a summary. It may not cover all possible information. If you have questions about this medicine, talk to your doctor, pharmacist, or health care provider.  2022 Elsevier/Gold Standard (2018-07-06 14:05:56)  

## 2020-11-11 ENCOUNTER — Other Ambulatory Visit: Payer: Self-pay | Admitting: Internal Medicine

## 2020-11-11 ENCOUNTER — Other Ambulatory Visit: Payer: Self-pay | Admitting: Cardiovascular Disease

## 2020-11-12 NOTE — Progress Notes (Signed)
Office Visit    Patient Name: Erica [name] Date of Encounter: 11/12/2020  PCP:  [name], MD   [place]  Cardiologist:  Skeet Latch, MD  Advanced Practice Provider:  No care team member to display Electrophysiologist:  None    Chief Complaint    Erica [name] is a 45 y.o. female with a hx of resistant hypertension, chronic diastolic heart failure, breast cancer s/p chemo and XRT, morbid obesity, OSA intolerant of CPAP presents today for follow up after cardiac cath.  Past Medical History    Past Medical History:  Diagnosis Date   Allergy    Anxiety    Apnea, sleep 05/04/2014   AHI 31/hr now on CPAP at 12cm H2O   Arthritis    Asthma    Asthma without status asthmaticus 01/19/2016   Axillary hidradenitis suppurativa 08/01/2011   Blood transfusion without reported diagnosis    Breast cancer ([place]) 2010   a. locally advanced left breast carcinoma diagnosed in 2010 and treated with neoadjuvant chemotherapy with docetaxel and cyclophosphamide as well as paclitaxel. This was followed by radiation therapy which was completed in 2011.   Chemotherapy-induced peripheral neuropathy ([place]) 12/09/2016   CHF (congestive heart failure) (HCC)    Chronic diastolic CHF (congestive heart failure) ([place]) 06/23/2013   Chronic kidney disease    Chronic pain in left foot 06/29/2018   Chronic stable angina ([place]) 08/22/2020   Cough variant asthma 09/27/2010   Followed in Pulmonary clinic/ [facility]/ [place]  Onset reported in childhood  - PFT's 01/31/2011 FEV1  1.76 (60% ) ratio 68 and no better p B2,  DLCO 70% -med review 09/25/2012 > [place] drug assistance  paperwork given    - HFA 90% 11/20/2012 .  -Med calendar 12/07/12 , 04/06/2013 , 04/18/2014,, 08/30/2014 , 11/08/2014  - PFT's 01/12/2013  1.73 (65%)  Ratio 73 and no better p B2, DLCO 86% - trial off    Depression with somatization 06/15/2015   Dyspnea on exertion    a. 01/2013 Lexi MV: EF 54%, no ischemia/infarct;   b. 05/2013 Echo: EF 60-65%, no rwma, Gr 2 DD;  b.    Essential hypertension 10/09/2014   Estimated Creatinine Clearance: 82.4 mL/min (by C-G formula based on SCr of 0.98 mg/dL).   Fatty liver    Gastroparesis    GERD (gastroesophageal reflux disease)    Hyperglycemia 04/13/2011   Hypertension    Hypertensive heart disease 09/26/2015   Hypertriglyceridemia without hypercholesterolemia 06/15/2015   [place] risk score is 1%    Idiopathic intracranial hypertension 10/21/2014   Impaired glucose tolerance 04/13/2011   Insomnia secondary to anxiety 04/05/2015   Lymphadenitis, chronic    restricted LEFT extremity   Malignant neoplasm of upper-outer quadrant of left breast in female, estrogen receptor negative ([place]) 01/07/2013   Stage III, Invasive Ductal, s/p partial Left Mastectomy/Lumpectomy (baseball sized) with Lymph Node dissection, had Chemo and RadRx     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[place]) 09/27/2010   OSA (obstructive sleep apnea) 05/04/2014   AHI 31/hr now on CPAP at 12cm H2O   Oxygen deficiency    Uses 02 in CPAP   Persistent proteinuria 03/31/2018   Personal history of chemotherapy 2010   Personal history of radiation therapy 2010   Sleep apnea    SVT (supraventricular tachycardia) ([place]) 08/22/2020   Tibialis posterior tendinitis, left 07/15/2018   Injected in March 10, 2019   Upper airway cough syndrome 10/09/2014   Onset around 2012  with assoc pseudowheeze MRI 05/18/15 > Paranasal sinuses are clear - 11/17/2015 rec increase h1 to 2 hs to help with noct cough  - Allergy profile 11/17/2015 >  Eos 0.1 /  IgE  80 POS RAST  to grass, ragweed, cedar tree  - d/c acei 03/06/2018  - flare end of Jan 2020 in setting of ? Uri/ poorly controlled GERD - flare mid Oct 2020 with uri / assoc secondary gerd   Vitamin D deficiency disease 03/31/2018   Past Surgical History:  Procedure Laterality Date   BRAIN SURGERY     [place]   BREAST LUMPECTOMY Left 09/2008   CARPAL TUNNEL RELEASE Left 2011   LYMPH NODE  DISSECTION Left 2010   breast; 2 wks after breast lumpectomy   RIGHT/LEFT HEART CATH AND CORONARY ANGIOGRAPHY N/A 05/04/2019   Procedure: RIGHT/LEFT HEART CATH AND CORONARY ANGIOGRAPHY;  Surgeon: Jolaine Artist, MD;  Location: [facility];  Service: Cardiovascular;  Laterality: N/A;  Renal injection preformed    RIGHT/LEFT HEART CATH AND CORONARY ANGIOGRAPHY N/A 10/25/2020   Procedure: RIGHT/LEFT HEART CATH AND CORONARY ANGIOGRAPHY;  Surgeon: Leonie Man, MD;  Location: [facility];  Service: Cardiovascular;  Laterality: N/A;   TENDON RELEASE Right 2012   "hand"   TUBAL LIGATION  05/11/2011   Procedure: POST PARTUM TUBAL LIGATION;  Surgeon: Emeterio Reeve, MD;  Location: [facility];  Service: Gynecology;  Laterality: Bilateral;  Induced for HTN    Allergies  Allergies  Allergen Reactions   Aspirin Shortness Of Breath and Palpitations    Pt can take ibuprofen without reaction   Doxycycline Anaphylaxis   Kiwi Extract Itching and Swelling    Lip swelling   Metoprolol Hives and Itching   Penicillins Shortness Of Breath and Palpitations    Has patient had a PCN reaction causing immediate rash, facial/tongue/throat swelling, SOB or lightheadedness with hypotension: Yes Has patient had a PCN reaction causing severe rash involving mucus membranes or skin necrosis: Yes Has patient had a PCN reaction that required hospitalization Yes Has patient had a PCN reaction occurring within the last 10 years: No If all of the above answers are "NO", then may proceed with Cephalosporin use.   Strawberry Extract Hives   Sulfamethoxazole-Trimethoprim Anaphylaxis    Facial, throat and tongue swelling with difficulty swallowing.    History of Present Illness    Erica [name] is a 45 y.o. female with a hx of resistant hypertension, chronic diastolic heart failure, breast cancer s/p chemo and XRT, morbid obesity, OSA intolerant of CPAP last seen for cardiac catheterization 10/25/20.  She has  history of hypertension dating back to treatment of breast cancer in 2010. She has established with Dr. Oval Linsey in the hypertension clinic. Previous echocardiogram 03/2018 LVEF [id number], grade 2 diastolic dysfunction, mild MR, PASP 33. Renal artery duppler 2019 with bilateral 1-59% stenosis. CT-A abdomen with 1.3 cm renal cyst but no adrenal adenomas. No evidence of renal artery stenosis.   She has a history of recurrent syncope. 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 She has sleep apnea but is intolerant of CPAP and now uses BIPAP. She had [facility] 05/2019 with finding of 60% distal LCx and 80% distal LAD disease. She was started on Clopidogrel due to allergy to asthma. LE duplex 10/2019 for lower extremity edema was negative for DVT.   There have been concerns previously about whether she is taking medications as prescribed given discrepancies between  what was ordered versus picked up from the pharmacy. She reports taking medications as prescribed.   Blood pressure has been difficult to control. Doxazosin ordered at initial clinic visit. Amlodipine subsequently added. She has been given Metolazone in addition to Torsemide due to edema. She has previously been interested in decreasing medications despite markedly elevated blood pressure. Her hydralazine was reduced to '50mg'$  TID and doxazosin increased to '8mg'$  twice daily to try to reduce TID dosing. Ranexa increased due to chest pain with improvement. Clonidine increased to 0.'6mg'$  QD. She wore monitor 03/23/20  showing PVC with up to 8 beats of SVT.   She was seen 08/22/20 by Dr. Oval Linsey. Her BP at home was 150s-160s in setting of taking Prednisone. She was bradycardia int he 50s but asymptomatic. She was participating in [employer] program at [facility]. Her Hydralazine was increased to '100mg'$  TID. Smlodipine, Clonidine, Diltiazem, Doxazosin, HCTZ, Minoxidil, Spironolactone, Nebivolol, and Entresot were continued.   Seen in  follow up 10/23/20. Noted chest pain where it feels as if her heart stops beating lasting a few minutes both at rest and with activity. Home BP was 140s-150s. She was not taking Spironolactone for unclear reason and it was resumed. She was scheduled for [facility] which was performed 10/25/20 with distal LAD 80% stenosed, LPAV lesion 30% stenosed with 50% stenosed side branch in first LPL. Hyperdynamic LV systolic function was noted. Hemodynamic findings were consistent with mild secondary pulmonary hypertension. It was overall unchanged from previous. Angina was suspected due to accelerated hypertension, increased wall stress, and microvascular ischemia. Distal LAD not favorable for PCI but could be considered for intractable pain. BP markedly elevated 200s/110s which improved after medication to 152/78.  She presents today for follow up. R radial catheterization site healing well. Reviewed cardiac cath findings in detail. She notes that her cuff BP was higher than her "internal BP" during cardiac cath. BP at home routinely 150s-160s per her report. Her weight is up 3 pounds from when she was last seen. Not weighing daily at home. She has resumed Spironolactone and reports other than increased urine output no change to blood pressure. Drinks water, 100% juice, and a rare cup of coffee. She has been trying to reduce <2L though does not always meet this goal. She has been taking Torsemide '40mg'$  in the morning. Reports no shortness of breath at rest and stable dyspnea on exertion with more than usual activity.. Reports no chest pain, pressure, or tightness. No orthopnea, PND. Reports occasional lower extremity edema. Reports no palpitations.    EKGs/Labs/Other Studies Reviewed:   The following studies were reviewed today:   [facility] 10/25/20   Dist LAD lesion is 80% stenosed.   LPAV lesion is 30% stenosed with 50% stenosed side branch in 1st LPL.   There is hyperdynamic left ventricular systolic function.  The left  ventricular ejection fraction is greater than 65% by visual estimate.   Hemodynamic findings consistent with mild (secondary) pulmonary hypertension - LVEDP ~25 mmHg, PCWP ~19-20 mmHg   Normal Cardiac Output / Index (7.91 / 3.94)   POST-OPERATIVE DIAGNOSIS:              Stable single-vessel CAD with apical LAD 80% stenosis-no change from previous cath.  Otherwise very tortuous coronary arteries with no significant stenoses.            Mild (likely secondary) Pulmonary Hypertension with PA mean pressure of 28 mmHg (PAP 39/15 mmHg), and PCWP of 19 mmHg with LVEDP of 25 mmHg.  Significant systolic hypertension with initial pressures in the 200s over 110s, however after medication 152/78 mmHg with MAP of 109 mmHg; LVP/EDP 155/13 mmHg - 25 mmHg.            Ao sat 99%, PA sat 81%.  Fick Cardiac Output-Index: 7.91-3.84 (normal)            Suspect angina is due to accelerated hypertension, increased wall stress and microvascular ischemia.  Distal LAD lesion is not favorable for PCI (however if pain is intractable, could be considered)    Echo 03/12/18: Study Conclusions  - Left ventricle: The cavity size was normal. Wall thickness was    increased in a pattern of moderate LVH. Systolic function was    mildly reduced. The estimated ejection fraction was in the range    of 45% to 50%. Wall motion was normal; there were no regional    wall motion abnormalities. Features are consistent with a    pseudonormal left ventricular filling pattern, with concomitant    abnormal relaxation and increased filling pressure (grade 2    diastolic dysfunction).  - Mitral valve: There was mild regurgitation.  - Pulmonary arteries: Systolic pressure was mildly increased. PA    peak pressure: 33 mm Hg (S).   Lexiscan Myoview 04/20/18:   The left ventricular ejection fraction is moderately decreased (30-44%). Nuclear stress EF: 41%. There was no ST segment deviation noted during stress. This is an  intermediate risk study.   Abnormal, intermediate risk stress nuclear study with no ischemia or infarction; EF 41 with global hypokinesis and mild LVE; study intermediate risk due to reduced LV function.   Renal artery Doppler 10/29/18: Right: 1-59% stenosis of the right renal artery. Cyst(s) noted.  Left:  1-59% stenosis of the left renal artery.     LHC/RHC 05/04/19: Dist LAD-1 lesion is 30% stenosed. Dist LAD-2 lesion is 80% stenosed. Dist Cx lesion is 60% stenosed.   Findings:   Ao = 173/104 (134) LV =  181/25 RA =  10 RV =  41/13 PA =  42/10 (26) PCW = 18 Fick cardiac output/index = 5.8/2.9 PVR = 1.4 wu Ao sat = 98% PA sat = 73%, 73%   Assessment: 1. 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 LVEF 55-60% 3. Mildly elevated filling pressures with mild pulmonary venous HTN m4. Widely patent bilateral renal arteries. No evidence of AAA 5. Severe systemic HTN   Monitor 03/23/2020: 3 Day Zio Monitor   Quality: Fair.  Baseline artifact. Predominant rhythm: sinus Average heart rate: 84 bpm Max heart rate: 140 bpm Min heart rate: 57 bpm Pauses >2.5 seconds: none Occasional PVCs and ventricular bigeminy Two runs of SVT up to 8 beats.  Rate 224 bpm.   EKG:  No EKG is  ordered today.  The ekg independently reviewed from 10/23/20 demonstrated SR 73 bpm with TWI noted in inferior leads.   Recent Labs: 04/10/2020: Magnesium 2.0 08/22/2020: ALT 35 10/24/2020: BUN 16; Creatinine, Ser 1.07; Platelets 218 10/25/2020: Hemoglobin 13.6; Potassium 3.5; Sodium 143  Recent Lipid Panel    Component Value Date/Time   CHOL 199 08/22/2020 0912   TRIG 114 08/22/2020 0912   HDL 44 08/22/2020 0912   CHOLHDL 4.5 (H) 08/22/2020 0912   CHOLHDL 4 10/01/2018 1115   VLDL 22.6 10/01/2018 1115   LDLCALC 134 (H) 08/22/2020 0912   LDLDIRECT 152.0 03/30/2018 1604   Home Medications   No outpatient medications have been marked as taking for the 11/13/20 encounter (Appointment) with  Loel Dubonnet,  NP.   Current Facility-Administered Medications for the 11/13/20 encounter (Appointment) with Loel Dubonnet, NP  Medication   sodium chloride flush (NS) 0.9 % injection 3 mL     Review of Systems      All other systems reviewed and are otherwise negative except as noted above.  Physical Exam    VS:  LMP  (LMP Unknown)  , BMI There is no height or weight on file to calculate BMI.  Wt Readings from Last 3 Encounters:  10/25/20 206 lb (93.4 kg)  10/23/20 207 lb (93.9 kg)  09/04/20 206 lb (93.4 kg)    GEN: Well nourished, overweight, well developed, in no acute distress. HEENT: normal. Neck: Supple, no JVD, carotid bruits, or masses. Cardiac: RRR, no murmurs, rubs, or gallops. No clubbing, cyanosis, edema.  Radials/PT 2+ and equal bilaterally.  Respiratory:  Respirations regular and unlabored, clear to auscultation bilaterally. GI: Soft, nontender, nondistended. MS: No deformity or atrophy. Skin: Warm and dry, no rash. Neuro:  Strength and sensation are intact. Psych: Normal affect.  Assessment & Plan    Malignant hypertension - BP not controlled. Reports home BP's 150-160 on wrist cuff. Question accuracy - encouraged to bring to next visit. Endorses compliance with Clonidine patch, Amlodipine, Ranexa, Diltiazem, Doxazosin, Hydralazine, HCTZ, Torsemide, Nebivolol, Entresto. . Encouraged to bring to pill bottles to next appointment. Given she is on nine antihypertensive agents low suspicion for medication compliance. Previously intolerance to Imdur. Heart healthy diet and regular cardiovascular exercise encouraged.  She has completed PREP program at the [facility]. Increase Torsemide to '60mg'$  daily for 3 days due to 3 lb weight gain with BMP, BNP on Friday. After 3 days, will return to Torsemide '40mg'$  QD.   Chronic stable angina / CAD - Intolerant of Imdur due to headache.She is on maximum dose Ranexa. GDMT includes Plavix (no Aspirin due to allergy), Nebivolol, Rosuvastatin, Zetia. Cath  site healing appropriately with no hematoma. Recent cath with stable single vessel CAD with apical 80% stenosis unchanged with torturous coronary arteries. Distal LAD lesion not favorable for PCI.  Hypertriglyceridemia / HLD, LDL goal <70 - 08/22/20 total cholesterol 199, HDL 44, triglycerides 114, LDL 134, AST 30, ALT 35. Continue Zetia 10 QD, Rosuvastatin '40mg'$  QD. Refills provided. Anticipate missed doses of Rosuvastatin led to elevated LDL.Consider repeat check in 3 months.  OSA - BIPAP compliance encouraged. Reports wearing every evening with her oxygen. She follows with Dr. Radford Pax in regards to her OSA.   Asthma - Follows with pulmonology Dr. Melvyn Novas. Of note she has been using oxygen while using the stairmaster at the gym due to tachycardia and shortness of breath.  She will need to follow with with pulmonology if she is requiring oxygen more than QHS. She was last seen 10/2019 by their office, recommend she schedule an appointment.  SVT - No evidence of recurrence.   Disposition: Follow up in 2 weeks with Dr. Oval Linsey or APP.  Signed, Loel Dubonnet, NP 11/12/2020, 7:06 PM [place]

## 2020-11-13 ENCOUNTER — Ambulatory Visit (INDEPENDENT_AMBULATORY_CARE_PROVIDER_SITE_OTHER): Payer: Medicare Other | Admitting: Family

## 2020-11-13 ENCOUNTER — Other Ambulatory Visit: Payer: Self-pay

## 2020-11-13 ENCOUNTER — Encounter (HOSPITAL_BASED_OUTPATIENT_CLINIC_OR_DEPARTMENT_OTHER): Payer: Self-pay | Admitting: Family

## 2020-11-13 ENCOUNTER — Other Ambulatory Visit (HOSPITAL_BASED_OUTPATIENT_CLINIC_OR_DEPARTMENT_OTHER): Payer: Self-pay | Admitting: Family

## 2020-11-13 VITALS — BP 220/108 | HR 90 | Ht 66.0 in | Wt 210.4 lb

## 2020-11-13 DIAGNOSIS — E785 Hyperlipidemia, unspecified: Secondary | ICD-10-CM | POA: Diagnosis not present

## 2020-11-13 DIAGNOSIS — I25118 Atherosclerotic heart disease of native coronary artery with other forms of angina pectoris: Secondary | ICD-10-CM

## 2020-11-13 DIAGNOSIS — I10 Essential (primary) hypertension: Secondary | ICD-10-CM

## 2020-11-13 DIAGNOSIS — G4733 Obstructive sleep apnea (adult) (pediatric): Secondary | ICD-10-CM

## 2020-11-13 DIAGNOSIS — E782 Mixed hyperlipidemia: Secondary | ICD-10-CM | POA: Diagnosis not present

## 2020-11-13 NOTE — Patient Instructions (Addendum)
Medication Instructions:  Your physician has recommended you make the following change in your medication:   DO NOT pick up Olmesartan-HCTZ-Amlodipine from the pharmacy.   CHANGE Torsemide to 3 tablets ('60mg'$ ) in the morning for 3 days, then return to '40mg'$  daily  *If you need a refill on your cardiac medications before your next appointment, please call your pharmacy*  Lab Work: Your physician recommends that you return for lab work on Friday for BMP, BNP  If you have labs (blood work) drawn today and your tests are completely normal, you will receive your results only by: [place] (if you have MyChart) OR A paper copy in the mail If you have any lab test that is abnormal or we need to change your treatment, we will call you to review the results.   Testing/Procedures: None ordered today   Follow-Up: At [facility], you and your health needs are our priority.  As part of our continuing mission to provide you with exceptional heart care, we have created designated Provider Care Teams.  These Care Teams include your primary Cardiologist (physician) and Advanced Practice Providers (APPs -  Physician Assistants and Nurse Practitioners) who all work together to provide you with the care you need, when you need it.  We recommend signing up for the patient portal called "MyChart".  Sign up information is provided on this After Visit Summary.  MyChart is used to connect with patients for Virtual Visits (Telemedicine).  Patients are able to view lab/test results, encounter notes, upcoming appointments, etc.  Non-urgent messages can be sent to your provider as well.   To learn more about what you can do with MyChart, go to [email or web address].    Your next appointment:   11/27/20 with Dr. Oval Linsey   Other Instructions  Tips to Measure your Blood Pressure Correctly  To determine whether you have hypertension, a medical professional will take a blood pressure reading. How you  prepare for the test, the position of your arm, and other factors can change a blood pressure reading by 10% or more. That could be enough to hide high blood pressure, start you on a drug you don't really need, or lead your doctor to incorrectly adjust your medications.  National and international guidelines offer specific instructions for measuring blood pressure. If a doctor, nurse, or medical assistant isn't doing it right, don't hesitate to ask him or her to get with the guidelines.  Here's what you can do to ensure a correct reading:  Don't drink a caffeinated beverage or smoke during the 30 minutes before the test.  Sit quietly for five minutes before the test begins.  During the measurement, sit in a chair with your feet on the floor and your arm supported so your elbow is at about heart level.  The inflatable part of the cuff should completely cover at least 80% of your upper arm, and the cuff should be placed on bare skin, not over a shirt.  Don't talk during the measurement.  Have your blood pressure measured twice, with a brief break in between. If the readings are different by 5 points or more, have it done a third time.  In 2017, new guidelines from the [place], the [employer] of Cardiology, and nine other health organizations lowered the diagnosis of high blood pressure to 130/80 mm Hg or higher for all adults. The guidelines also redefined the various blood pressure categories to now include normal, elevated, Stage 1 hypertension, Stage 2 hypertension, and  hypertensive crisis (see "Blood pressure categories").  Blood pressure categories  Blood pressure category SYSTOLIC (upper number)  DIASTOLIC (lower number)  Normal Less than 120 mm Hg and Less than 80 mm Hg  Elevated 120-129 mm Hg and Less than 80 mm Hg  High blood pressure: Stage 1 hypertension 130-139 mm Hg or 80-89 mm Hg  High blood pressure: Stage 2 hypertension 140 mm Hg or higher or 90 mm Hg or  higher  Hypertensive crisis (consult your doctor immediately) Higher than 180 mm Hg and/or Higher than 120 mm Hg  Source: American Heart Association and American Stroke Association. For more on getting your blood pressure under control, buy Controlling Your Blood Pressure, a Special Health Report from [facility].   Blood Pressure Log   Date   Time  Blood Pressure  Position  Example: Nov 1 9 AM 124/78 sitting                                                     Recommend weighing daily and keeping a log. Please call our office if you have weight gain of 2 pounds overnight or 5 pounds in 1 week.   Date  Time Weight                                             Heart Healthy Diet Recommendations: A low-salt diet is recommended. Meats should be grilled, baked, or boiled. Avoid fried foods. Focus on lean protein sources like fish or chicken with vegetables and fruits. The American Heart Association is a Microbiologist!  American Heart Association Diet and Lifeystyle Recommendations    Exercise recommendations: The American Heart Association recommends 150 minutes of moderate intensity exercise weekly. Try 30 minutes of moderate intensity exercise 4-5 times per week. This could include walking, jogging, or swimming.

## 2020-11-13 NOTE — Telephone Encounter (Signed)
Pt requesting RX, would you like to refill this?

## 2020-11-14 ENCOUNTER — Telehealth: Payer: Self-pay | Admitting: *Deleted

## 2020-11-14 NOTE — Telephone Encounter (Signed)
-----   Message from [name], MD sent at 10/24/2020 10:24 AM EDT ----- Please find out why this was not ordered ----- Message ----- From: Loel Dubonnet, NP Sent: 10/23/2020   8:03 PM EDT To: [name], MD  MSLT recommended at visit 05/2020 but does not appear completed from my review. Just an FYI. Thanks!

## 2020-11-14 NOTE — Telephone Encounter (Signed)
Bipap titration/MSLT was ordered but patient says she cancelled her sleep study due to a death in her family. Patient will reschedule her test at her convenience and let me know. Thanks

## 2020-11-20 LAB — BASIC METABOLIC PANEL
BUN/Creatinine Ratio: 14 (ref 9–23)
BUN: 13 mg/dL (ref 6–24)
CO2: 25 mmol/L (ref 20–29)
Calcium: 9.4 mg/dL (ref 8.7–10.2)
Chloride: 104 mmol/L (ref 96–106)
Creatinine, Ser: 0.91 mg/dL (ref 0.57–1.00)
Glucose: 94 mg/dL (ref 65–99)
Potassium: 3.8 mmol/L (ref 3.5–5.2)
Sodium: 143 mmol/L (ref 134–144)
eGFR: 78 mL/min/{1.73_m2} (ref >59)

## 2020-11-21 ENCOUNTER — Other Ambulatory Visit: Payer: Self-pay | Admitting: Cardiovascular Disease

## 2020-11-21 LAB — BRAIN NATRIURETIC PEPTIDE: BNP: 22.9 pg/mL (ref 0.0–100.0)

## 2020-11-24 NOTE — Progress Notes (Signed)
Cardiology Office Note:    Date:  11/27/2020   ID:  Erica [name], DOB 1975-08-26, MRN [id number]  PCP:  [name], MD  Cardiologist:  Skeet Latch, MD  Nephrologist:  Referring MD: [name], MD   CC: Hypertension  History of Present Illness:    Erica [name] is a 45 y.o. female with a hx of resistant hypertension, medically managed CAD, chronic diastolic heart failure, breast cancer s/p chemo and XRT, morbid obesity and OSA intolerant of CPAP here for follow-up. She initially established care in the hypertension clinic 04/10/2020. She reports that her BP has been elevated since she was treated for breast cancer in 2010.  Since then her blood pressure has never been very well have been controlled.  She has been on multiple antihypertensives and her blood pressure remains in the 372B to 021J systolic.  She struggles with exertional dyspnea that makes it hard for her to walk even short distances.  Echo 03/2018 revealed LVEF 45 to 50% with grade 2 diastolic dysfunction.  There was mild MR.  PASP was 33.  Erica [name] has a history of recurrent syncope.  She had an episode that occurred while wearing an ambulatory monitor and the episode was felt to be due to narcolepsy.  She has sleep apnea but is not tolerant of her CPAP machine.  She now uses her BIPAP machine but struggles with insomnia.  She wakes up hourly.  She underwent left and right heart catheterization on 05/04/2019.  She was found to have 60% distal left circumflex and 80% distal LAD disease.  Given her aspirin allergy Dr. Meda Coffee recommended that she start clopidogrel.  Right atrial pressure was 10, and PA pressure was 26/10.  PCWP 18.  LVEDP was 23 mmHg.  At her first hypertension clinic visit there were concerns about whether she was taking her medication as prescribed.  Discrepancies between what was ordered and what she was actually picking up from the pharmacy.  She continues to report taking all medications as  prescribed.  Her blood pressures continue to run quite elevated.  At her initial assessment doxazosin was added to her regimen.  This does not seem to have helped much.  She followed up with our pharmacist and was started on amlodipine.  She is unable to exercise due to poorly controlled blood pressure.  She has 1 caffeinated drink daily and does not use over-the-counter supplements or medications.  She continues to try to minimize her sodium intake.  Renal artery Dopplers revealed 1- 59% stenosis bilaterally in 2019.  CT-A of the abdomen showed a 1.3 cm renal cyst but no adrenal adenomas.  There was no evidence of renal artery stenosis.  Erica [name] saw our pharmacist on 12/2019.  She was in a lot of pain at the time.  Given her high number or medications and her pain, her medications were not changed.  She has struggled with LE edema.  LE Doppler was negative for DVT 10/2019.  She was given metolazone to take prior to torsemide.  For the last two months she has been more swollen but it is finally better.  Her weight has decreased from 211 lb to 187 lb.  Erica [name] continues to struggle with shortness of breath. Her edema resolved but there was no improvement in her breathing.  She uses her CPAP every other night.  She notes that there is a recall on her CPAP and she isn't supposed to be using it.  However,  she can't sleep well without it. Lately her BP at home has been in the 150-200s.  She notes that when she has pain it is higher.  She has a lot of joint pain, especially in her feel.    Erica [name] wanted to reduce her medications.  However her blood pressure was significantly elevated.  We reduced hydralazine to 50 mg 3 times a day and increase doxazosin to 8 mg twice daily in an effort to try and get her off of her 3 times daily medications and improve compliance.  Ranexa was increased due to chest pain.  This seems to have helped her chest pain.  It seems to occur randomly both at rest and with exertion.  Her  clonidine was increased to 0.6 mg every 24 hours. She wore an  ambulatory monitor that showed PVCs and up to 8 beats of SVT.  The episodes seem to occur mostly in the early a.m.  She started going to the PREP program at the [facility].  She notes that her heart rate goes up quickly.  It makes her feel exhausted.  She takes her rest when she needs to and start back exercising.  She has lower extremity edema by the end of the day but no orthopnea or PND.  She did get a new CPAP machine and struggles to use it because of claustrophobia.  However she does use it every night. [facility] continued to have exertional intolerance and chest pain.  She underwent left heart catheterization 09/2020 and her cath revealed 80% distal LAD and 30% LPA B with 50% PL 1 stenoses.  Overall unchanged from prior.  Her blood pressure is very poorly controlled, initially in the low 200s over 110s.  It did improve to 152/78 with hydralazine and verapamil IV.  It was noted that she was extremely anxious and that it was also contributing.  Her angina was thought to be due to microvascular disease and poorly controlled blood pressure.  Her distal LAD lesion was not thought to be favorable for PCI but if her chest pain were intractable could be considered.  Last Tuesday, 8/23 she developed a pain in her lower back. Saturday morning, 8/27 she became nauseated and had emesis throughout the day. She denies any burning sensations while urinating. At this time she is not feeling any nausea. At home her blood pressure has been more stable, she checks it about once a day in the morning. Of note, just prior to our visit there was an emergency evacuation, which likely contributes to her blood pressure today. Generally she struggles with anxiety as well. She continues to use her CPAP regularly. Normally she has LE edema by the end of the day, and takes torsemide daily. Currently she is taking doxazosin. She denies any palpitations,  chest pain, or shortness of breath. No lightheadedness, headaches, syncope, orthopnea, or PND.   Past Medical History:  Diagnosis Date   Allergy    Anxiety    Apnea, sleep 05/04/2014   AHI 31/hr now on CPAP at 12cm H2O   Arthritis    Asthma    Asthma without status asthmaticus 01/19/2016   Axillary hidradenitis suppurativa 08/01/2011   Blood transfusion without reported diagnosis    Breast cancer ([place]) 2010   a. locally advanced left breast carcinoma diagnosed in 2010 and treated with neoadjuvant chemotherapy with docetaxel and cyclophosphamide as well as paclitaxel. This was followed by radiation therapy which was completed in 2011.  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  -Med calendar 12/07/12 , 04/06/2013 , 04/18/2014,, 08/30/2014 , 11/08/2014  - PFT's 01/12/2013  1.73 (65%)  Ratio 73 and no better p B2, DLCO 86% - trial off    Depression with somatization 06/15/2015   Dyspnea on exertion    a. 01/2013 Lexi MV: EF 54%, no ischemia/infarct;  b. 05/2013 Echo: EF 60-65%, no rwma, Gr 2 DD;  b.    Essential hypertension 10/09/2014   Estimated Creatinine Clearance: 82.4 mL/min (by C-G formula based on SCr of 0.98 mg/dL).   Fatty liver    Gastroparesis    GERD (gastroesophageal reflux disease)    Hyperglycemia 04/13/2011   Hypertension    Hypertensive heart disease 09/26/2015   Hypertriglyceridemia without hypercholesterolemia 06/15/2015   Framingham risk score is 1%    Idiopathic intracranial  hypertension 10/21/2014   Impaired glucose tolerance 04/13/2011   Insomnia secondary to anxiety 04/05/2015   Lymphadenitis, chronic    restricted LEFT extremity   Malignant neoplasm of upper-outer quadrant of left breast in female, estrogen receptor negative ([place]) 01/07/2013   Stage III, Invasive Ductal, s/p partial Left Mastectomy/Lumpectomy (baseball sized) with Lymph Node dissection, had Chemo and RadRx     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[place]) 09/27/2010   OSA (obstructive sleep apnea) 05/04/2014   AHI 31/hr now on CPAP at 12cm H2O   Oxygen deficiency    Uses 02 in CPAP   Persistent proteinuria 03/31/2018   Personal history of chemotherapy 2010   Personal history of radiation therapy 2010   Sleep apnea    SVT (supraventricular tachycardia) ([place]) 08/22/2020   Tibialis posterior tendinitis, left 07/15/2018   Injected in March 10, 2019   Upper airway cough syndrome 10/09/2014   Onset around 2012 with assoc pseudowheeze MRI 05/18/15 > Paranasal sinuses are clear - 11/17/2015 rec increase h1 to 2 hs to help with noct cough  - Allergy profile 11/17/2015 >  Eos 0.1 /  IgE  80 POS RAST  to grass, ragweed, cedar tree  - d/c acei 03/06/2018  - flare end of Jan 2020 in setting of ? Uri/ poorly controlled GERD - flare mid Oct 2020 with uri / assoc secondary gerd   Vitamin D deficiency disease 03/31/2018    Past Surgical History:  Procedure Laterality Date   BRAIN SURGERY     [place]   BREAST LUMPECTOMY Left 09/2008   CARPAL TUNNEL RELEASE Left 2011   LYMPH NODE DISSECTION Left 2010   breast; 2 wks after breast lumpectomy   RIGHT/LEFT HEART CATH AND CORONARY ANGIOGRAPHY N/A 05/04/2019   Procedure: RIGHT/LEFT HEART CATH AND CORONARY ANGIOGRAPHY;  Surgeon: Jolaine Artist, MD;  Location: [facility];  Service: Cardiovascular;  Laterality: N/A;  Renal injection preformed    RIGHT/LEFT HEART CATH AND CORONARY ANGIOGRAPHY N/A 10/25/2020   Procedure: RIGHT/LEFT HEART CATH AND CORONARY ANGIOGRAPHY;  Surgeon:  Leonie Man, MD;  Location: [facility];  Service: Cardiovascular;  Laterality: N/A;   TENDON RELEASE Right 2012   "hand"   TUBAL LIGATION  05/11/2011   Procedure: POST PARTUM TUBAL LIGATION;  Surgeon: Emeterio Reeve, MD;  Location: [facility];  Service: Gynecology;  Laterality: Bilateral;  Induced for HTN    Current Medications: Current Meds  Medication Sig   acetaZOLAMIDE (DIAMOX) 250 MG tablet Take 4 tablets (1,000 mg total) by mouth 3 (three) times daily.   albuterol (PROVENTIL) (2.5 MG/3ML) 0.083% nebulizer solution USE 1 VIAL VIA NEBULIZER EVERY 4 HOURS AND AS NEEDED FOR WHEEZING OR SHORTNESS OF BREATH (Patient taking differently: Take 2.5 mg by nebulization every 6 (six) hours as needed for wheezing or shortness of breath.)   albuterol (VENTOLIN HFA) 108 (90 Base) MCG/ACT inhaler Inhale 2 puffs into the lungs every 6 (six) hours as needed for wheezing or shortness of breath.   AMBULATORY NON FORMULARY MEDICATION Carpal Tunnel Wrist brace. Rt  Carpal Tunnel Syndrome G56.01  Disp 1   AMBULATORY NON FORMULARY MEDICATION Lace up ankle brace (ASO type)  Ankle sprain S93.491A  Disp 1   amitriptyline (ELAVIL) 100 MG tablet TAKE 1 TABLET BY MOUTH EVERYDAY AT BEDTIME (Patient taking differently: Take 100 mg by mouth at bedtime.)   Biotin 5000 MCG TABS Take 5,000 mg by mouth 2 (two) times daily.    budesonide-formoterol (SYMBICORT) 160-4.5 MCG/ACT inhaler INHALE 2 PUFFS INTO LUNGS 2 TIMES DAILY AS DIRECTED (Patient taking differently: Inhale 2 puffs into the lungs 2 (two) times daily.)   Cholecalciferol 1.25 MG (50000 UT) capsule Take 1 capsule (50,000 Units total) by mouth once a week. (Patient taking differently: Take 4,000 Units by mouth daily. 2000 mg each  gummies)   cloNIDine (CATAPRES - DOSED IN MG/24 HR) 0.3 mg/24hr patch PLACE 2 PATCHES ONTO SKIN ONCE WEEKLY (Patient taking differently: Place 0.6 mg onto the skin See admin instructions. PLACE 2 PATCHES ONTO SKIN ONCE WEEKLY)    clopidogrel (PLAVIX) 75 MG tablet Take 1 tablet (75 mg total) by mouth daily.   clotrimazole (MYCELEX) 10 MG troche Take 1 tablet (10 mg total) by mouth 5 (five) times daily.   dexlansoprazole (DEXILANT) 60 MG capsule Take 1 capsule (60 mg total) by mouth daily.   dextromethorphan-guaiFENesin (MUCINEX DM) 30-600 MG 12hr tablet Take 2 tablets by mouth 2 (two) times daily.   doxazosin (CARDURA) 8 MG tablet Take 1 tablet (8 mg total) by mouth 2 (two) times daily. (Patient taking differently: Take 8 mg by mouth at bedtime.)   DULoxetine (CYMBALTA) 30 MG capsule TAKE 1 CAPSULE (30 MG TOTAL) BY MOUTH DAILY. WITH 60MG FOR TOTAL OF 90 MG (Patient taking differently: Take 30 mg by mouth See admin instructions. With 78m for total of 90 mg)   DULoxetine (CYMBALTA) 60 MG capsule TAKE 1 CAPSULE (60 MG TOTAL) BY MOUTH DAILY. FOR TOTAL OF 90 MG (Patient taking differently: Take 60 mg by mouth See admin instructions. Take with 30 mg for a total of 90 mg in the morning)   ENTRESTO 97-103 MG TAKE 1 TABLET BY MOUTH TWICE A DAY   EPINEPHRINE 0.3 mg/0.3 mL IJ SOAJ injection INJECT 0.3 MLS INTO THE MUSCLE ONCE AS NEEDED FOR ANAPHYLAXIS (Patient taking differently: Inject 0.3 mg into the muscle as needed for anaphylaxis.)   ezetimibe (ZETIA) 10 MG tablet Take 1 tablet (10 mg total) by mouth daily.   famotidine (PEPCID) 20 MG tablet Take 1-2 tablets (20-40 mg total) by mouth at bedtime. (Patient taking differently: Take 40  mg by mouth at bedtime.)   gabapentin (NEURONTIN) 300 MG capsule Take 1 capsule (300 mg total) by mouth 3 (three) times daily. One three times daily (Patient taking differently: Take 300 mg by mouth 3 (three) times daily.)   lidocaine-prilocaine (EMLA) cream Apply 1 application topically every 28 (twenty-eight) days. Apply 1 application to skin ever 28 days   LORazepam (ATIVAN) 0.5 MG tablet 1-2 tabs 30 - 60 min prior to MRI. Do not drive with this medicine.   metoCLOPramide (REGLAN) 10 MG tablet TAKE 1  TABLET BY MOUTH 4 TIMES DAILY. (Patient taking differently: Take 10 mg by mouth 4 (four) times daily -  before meals and at bedtime.)   minoxidil (LONITEN) 2.5 MG tablet Take 2 tablets (5 mg total) by mouth 2 (two) times daily.   montelukast (SINGULAIR) 10 MG tablet TAKE 1 TABLET BY MOUTH EVERYDAY AT BEDTIME (Patient taking differently: Take 10 mg by mouth at bedtime.)   Nebivolol HCl 20 MG TABS Take 40 mg by mouth at bedtime.   nitroGLYCERIN (NITROSTAT) 0.4 MG SL tablet Place 1 tablet (0.4 mg total) under the tongue every 5 (five) minutes as needed for chest pain.   oxymetazoline (MUCINEX SINUS-MAX FULL FORCE) 0.05 % nasal spray Place 1 spray into both nostrils 2 (two) times daily.   PEG-KCl-NaCl-NaSulf-Na Asc-C (PLENVU) 140 g SOLR Take 1 kit by mouth as directed. Manufacturer's coupon Universal coupon code:BIN: P2366821; GROUP: [id number]; PCN: [id number]; ID: [id number]; PAY NO MORE $50; NO prior authorization   Potassium Chloride ER 20 MEQ TBCR TAKE 1 TABLET BY MOUTH TWICE A DAY (Patient taking differently: Take 20 mEq by mouth daily.)   primidone (MYSOLINE) 50 MG tablet Take 0.5 tablets (25 mg total) by mouth at bedtime.   promethazine (PHENERGAN) 25 MG suppository Place 25 mg rectally every 6 (six) hours as needed for nausea or vomiting.    promethazine (PHENERGAN) 25 MG tablet TAKE 1 TABLET (25 MG TOTAL) BY MOUTH 3 (THREE) TIMES DAILY AS NEEDED FOR NAUSEA OR VOMITING.   promethazine-dextromethorphan (PROMETHAZINE-DM) 6.25-15 MG/5ML syrup TAKE 5 MLS BY MOUTH 4 (FOUR) TIMES DAILY AS NEEDED FOR COUGH.   ranolazine (RANEXA) 1000 MG SR tablet Take 1 tablet (1,000 mg total) by mouth 2 (two) times daily.   Respiratory Therapy Supplies (FLUTTER) DEVI Use as directed   rosuvastatin (CRESTOR) 40 MG tablet Take 1 tablet (40 mg total) by mouth daily.   Spacer/Aero-Holding Chambers (AEROCHAMBER MV) inhaler Use as instructed   spironolactone (ALDACTONE) 100 MG tablet Take 1 tablet (100 mg total) by mouth daily.    torsemide (DEMADEX) 20 MG tablet Take 1 tablet (20 mg total) by mouth 2 (two) times daily.   Current Facility-Administered Medications for the 11/27/20 encounter (Office Visit) with Skeet Latch, MD  Medication   sodium chloride flush (NS) 0.9 % injection 3 mL     Allergies:   Aspirin, Doxycycline, Kiwi extract, Metoprolol, Penicillins, Strawberry extract, and Sulfamethoxazole-trimethoprim   Social History   Socioeconomic History   Marital status: Single    Spouse name: Not on file   Number of children: 3   Years of education: Not on file   Highest education level: Not on file  Occupational History    Employer: UNEMPLOYED  Tobacco Use   Smoking status: Never   Smokeless tobacco: Never  Vaping Use   Vaping Use: Never used  Substance and Sexual Activity   Alcohol use: Yes    Alcohol/week: 0.0 standard drinks    Comment: occasionally/socially  Drug use: No   Sexual activity: Yes    Birth control/protection: Surgical  Other Topics Concern   Not on file  Social History Narrative   She lives with four children.   She is currently not working (disbaility pending).   Left handed   One story home   Drinks caffeine      Social Determinants of Health   Financial Resource Strain: Medium Risk   Difficulty of Paying Living Expenses: Somewhat hard  Food Insecurity: No Food Insecurity   Worried About Charity fundraiser in the Last Year: Never true   Ran Out of Food in the Last Year: Never true  Transportation Needs: No Transportation Needs   Lack of Transportation (Medical): No   Lack of Transportation (Non-Medical): No  Physical Activity: Not on file  Stress: Stress Concern Present   Feeling of Stress : Rather much  Social Connections: Not on file     Family History: The patient's family history includes Breast cancer in some other family members; CAD in her maternal grandmother; Diabetes in her maternal grandmother; Fibroids (age of onset: 2) in her sister; Heart  disease in her maternal grandmother; Hypertension in her maternal grandmother; Other (age of onset: 54) in her mother. There is no history of Cancer, Alcohol abuse, Early death, Hyperlipidemia, Kidney disease, Stroke, Colon cancer, Esophageal cancer, Stomach cancer, Rectal cancer, or Colon polyps.  ROS:   Please see the history of present illness.    (+) Lower back pain (+) Anxiety (+) LE edema All other systems reviewed and are negative.  EKGs/Labs/Other Studies Reviewed:    EKG:   11/27/2020: EKG is not ordered today. 08/22/2020: EKG was not ordered. 06/16/2020: EKG was not ordered.   04/23/19: sinus tachycardia.  Rate 124 bpm.  LVH  Recent Labs: 04/10/2020: Magnesium 2.0 08/22/2020: ALT 35 10/24/2020: Platelets 218 10/25/2020: Hemoglobin 13.6 11/20/2020: BNP 22.9; BUN 13; Creatinine, Ser 0.91; Potassium 3.8; Sodium 143   Recent Lipid Panel    Component Value Date/Time   CHOL 199 08/22/2020 0912   TRIG 114 08/22/2020 0912   HDL 44 08/22/2020 0912   CHOLHDL 4.5 (H) 08/22/2020 0912   CHOLHDL 4 10/01/2018 1115   VLDL 22.6 10/01/2018 1115   LDLCALC 134 (H) 08/22/2020 0912   LDLDIRECT 152.0 03/30/2018 1604    Monitor 03/23/2020: 3 Day Zio Monitor   Quality: Fair.  Baseline artifact. Predominant rhythm: sinus Average heart rate: 84 bpm Max heart rate: 140 bpm Min heart rate: 57 bpm Pauses >2.5 seconds: none Occasional PVCs and ventricular bigeminy Two runs of SVT up to 8 beats.  Rate 224 bpm.    LHC/RHC 05/04/19: Dist LAD-1 lesion is 30% stenosed. Dist LAD-2 lesion is 80% stenosed. Dist Cx lesion is 60% stenosed.   Findings:   Ao = 173/104 (134) LV =  181/25 RA =  10 RV =  41/13 PA =  42/10 (26) PCW = 18 Fick cardiac output/index = 5.8/2.9 PVR = 1.4 wu Ao sat = 98% PA sat = 73%, 73%   Assessment: 1. 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 LVEF 55-60% 3. Mildly elevated filling pressures with mild pulmonary venous HTN m4. Widely patent bilateral renal arteries. No  evidence of AAA 5. Severe systemic HTN   Renal artery Doppler 10/29/18: Right: 1-59% stenosis of the right renal artery. Cyst(s) noted.  Left:  1-59% stenosis of the left renal artery.    Lexiscan Myoview 04/20/18:   The left ventricular ejection fraction is moderately decreased (30-44%).  Nuclear stress EF: 41%. There was no ST segment deviation noted during stress. This is an intermediate risk study.   Abnormal, intermediate risk stress nuclear study with no ischemia or infarction; EF 41 with global hypokinesis and mild LVE; study intermediate risk due to reduced LV function.  Echo 03/12/18: Study Conclusions  - Left ventricle: The cavity size was normal. Wall thickness was    increased in a pattern of moderate LVH. Systolic function was    mildly reduced. The estimated ejection fraction was in the range    of 45% to 50%. Wall motion was normal; there were no regional    wall motion abnormalities. Features are consistent with a    pseudonormal left ventricular filling pattern, with concomitant    abnormal relaxation and increased filling pressure (grade 2    diastolic dysfunction).  - Mitral valve: There was mild regurgitation.  - Pulmonary arteries: Systolic pressure was mildly increased. PA    peak pressure: 33 mm Hg (S).    Physical Exam:    Wt Readings from Last 3 Encounters:  11/27/20 209 lb 6.4 oz (95 kg)  11/13/20 210 lb 6.4 oz (95.4 kg)  10/25/20 206 lb (93.4 kg)    VS:  BP (!) 208/72 (BP Location: Right Arm, Patient Position: Sitting)   Pulse (!) 107   Ht '5\' 6"'  (1.676 m)   Wt 209 lb 6.4 oz (95 kg)   LMP  (LMP Unknown)   SpO2 96%   BMI 33.80 kg/m  , BMI Body mass index is 33.8 kg/m. GENERAL:  Well appearing HEENT: Pupils equal round and reactive, fundi not visualized, oral mucosa unremarkable NECK:  No jugular venous distention, waveform within normal limits, carotid upstroke brisk and symmetric, no bruits LUNGS:  Clear to auscultation bilaterally HEART:   RRR.  PMI not displaced or sustained,S1 and S2 within normal limits, no S3, no S4, no clicks, no rubs, no murmurs ABD:  Non-tender.  positive bowel sounds normal in frequency in pitch, no bruits, no rebound, no guarding, no midline pulsatile mass, no hepatomegaly, no splenomegaly EXT:  2 plus pulses throughout, no edema , no cyanosis no clubbing SKIN:  No rashes no nodules NEURO:  Cranial nerves II through XII grossly intact, motor grossly intact throughout PSYCH:  Cognitively intact, oriented to person place and time   ASSESSMENT:    1. Anxiety   2. Malignant hypertension   3. Coronary artery disease of native artery of native heart with stable angina pectoris ([place])      PLAN:   Malignant hypertension BP remains very poorly controlled despite being on multiple medications.  She had a very thorough work-up of secondary causes which has been unrevealing.  She does report significant anxiety.  Will refer to psychiatry.  We will refer her to [facility] for the target BP study.  Continue doxazosin, minoxidil, chlorthalidone, clonidine, hydralazine, Imdur and torsemide.  It is unclear what happened to her amlodipine.  Her pharmacy verifies that she has been picking up her medications with the exception of the doxazosin.  The most recent a milligram dose has not ever been picked up.  She notes that she was taking an old prescription and she thinks she may be taken only 4 mg twice a day.  She will increase this to 8 mg twice a day.  She can also take hydralazine up to 4 times a day as needed for blood pressures remain elevated.  Coronary artery disease 80% distal LAD lesion that is medically  managed.  She continues to have exertional symptoms.  This is not a good target for cath.  If she continues to have refractory symptoms it could be considered in the future.  For now we will continue focusing on blood pressure control and medications.  Continue rosuvastatin, nebivolol, Zetia, clopidogrel, Imdur,  and Ranexa.  Continue trying to get regular exercise.    Secondary Causes of Hypertension  Medications/Herbal: OCP, steroids, stimulants, antidepressants, weight loss medication, immune suppressants, NSAIDs, sympathomimetics, alcohol, caffeine, licorice, ginseng, St. John's wort, chemo Cymbalta  Sleep Apnea: treated Renal artery stenosis: Mild on ultrasound 09/2018.  Not seen on CT-A 2021 Hyperaldosteronism: normal 04/2019 Hyper/hypothyroidism: TSH normal 04/2019 Pheochromocytoma: (testing not indicated)  Cushing's syndrome: Check cortisol Coarctation of the aorta:    Disposition: FU with Russell Quinney C. Oval Linsey, MD, [facility] in 3-4 months.   Medication Adjustments/Labs and Tests Ordered: Current medicines are reviewed at length with the patient today.  Concerns regarding medicines are outlined above.   Orders Placed This Encounter  Procedures   Ambulatory referral to Psychology    No orders of the defined types were placed in this encounter.   I,Mathew Stumpf,acting as a Education administrator for Skeet Latch, MD.,have documented all relevant documentation on the behalf of Skeet Latch, MD,as directed by  Skeet Latch, MD while in the presence of Skeet Latch, MD.  I, Bovey Oval Linsey, MD have reviewed all documentation for this visit.  The documentation of the exam, diagnosis, procedures, and orders on 11/27/2020 are all accurate and complete.  Signed, Skeet Latch, MD  11/27/2020 12:20 PM    [facility]

## 2020-11-27 ENCOUNTER — Encounter (HOSPITAL_BASED_OUTPATIENT_CLINIC_OR_DEPARTMENT_OTHER): Payer: Self-pay | Admitting: Cardiovascular Disease

## 2020-11-27 ENCOUNTER — Other Ambulatory Visit: Payer: Self-pay

## 2020-11-27 ENCOUNTER — Ambulatory Visit (INDEPENDENT_AMBULATORY_CARE_PROVIDER_SITE_OTHER): Payer: Medicare Other | Admitting: Cardiovascular Disease

## 2020-11-27 VITALS — BP 208/72 | HR 107 | Ht 66.0 in | Wt 209.4 lb

## 2020-11-27 DIAGNOSIS — F419 Anxiety disorder, unspecified: Secondary | ICD-10-CM | POA: Diagnosis not present

## 2020-11-27 DIAGNOSIS — I10 Essential (primary) hypertension: Secondary | ICD-10-CM | POA: Diagnosis not present

## 2020-11-27 DIAGNOSIS — I25118 Atherosclerotic heart disease of native coronary artery with other forms of angina pectoris: Secondary | ICD-10-CM | POA: Diagnosis not present

## 2020-11-27 NOTE — Assessment & Plan Note (Addendum)
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  She continues to have exertional symptoms.  This is not a good target for cath.  If she continues to have refractory symptoms it could be considered in the future.  For now we will continue focusing on blood pressure control and medications.  Continue rosuvastatin, nebivolol, Zetia, clopidogrel, Imdur, and Ranexa.  Continue trying to get regular exercise.

## 2020-11-27 NOTE — Assessment & Plan Note (Signed)
BP remains very poorly controlled despite being on multiple medications.  She had a very thorough work-up of secondary causes which has been unrevealing.  She does report significant anxiety.  Will refer to psychiatry.  We will refer her to [facility] for the target BP study.  Continue doxazosin, minoxidil, chlorthalidone, clonidine, hydralazine, Imdur and torsemide.  It is unclear what happened to her amlodipine.  Her pharmacy verifies that she has been picking up her medications with the exception of the doxazosin.  The most recent a milligram dose has not ever been picked up.  She notes that she was taking an old prescription and she thinks she may be taken only 4 mg twice a day.  She will increase this to 8 mg twice a day.  She can also take hydralazine up to 4 times a day as needed for blood pressures remain elevated.

## 2020-11-27 NOTE — Patient Instructions (Addendum)
Medication Instructions:  MAKE SURE YOU ARE TAKING DOXAZOSIN 8 MG   OK TO TAKE AN EXTRA HYDRALAZINE IF YOUR BLOOD PRESSURE IS ELEVATED   *If you need a refill on your cardiac medications before your next appointment, please call your pharmacy*  Lab Work: NONE  Testing/Procedures: NONE   Follow-Up: At [facility], you and your health needs are our priority.  As part of our continuing mission to provide you with exceptional heart care, we have created designated Provider Care Teams.  These Care Teams include your primary Cardiologist (physician) and Advanced Practice Providers (APPs -  Physician Assistants and Nurse Practitioners) who all work together to provide you with the care you need, when you need it.  We recommend signing up for the patient portal called "MyChart".  Sign up information is provided on this After Visit Summary.  MyChart is used to connect with patients for Virtual Visits (Telemedicine).  Patients are able to view lab/test results, encounter notes, upcoming appointments, etc.  Non-urgent messages can be sent to your provider as well.   To learn more about what you can do with MyChart, go to [email or web address].    Your next appointment:   3 month(s)  The format for your next appointment:   In Person  Provider:   Skeet Latch, MD  You have been referred to psychology today. Mental health is an important part of heart health. You have been referred to Dr. Elias Else. He is located at [facility] at [facility]. They will contact you to schedule an appointment. If you have not heard from them in 1 week, you may call [phone] to schedule an appointment.   Other Instructions  Consider applying for the renal denervation trial at [facility]:  [email or web address]

## 2020-12-01 ENCOUNTER — Other Ambulatory Visit: Payer: Self-pay | Admitting: Internal Medicine

## 2020-12-02 ENCOUNTER — Other Ambulatory Visit: Payer: Self-pay | Admitting: Internal Medicine

## 2020-12-07 ENCOUNTER — Inpatient Hospital Stay: Payer: Medicare Other

## 2020-12-07 ENCOUNTER — Other Ambulatory Visit: Payer: Self-pay

## 2020-12-07 ENCOUNTER — Inpatient Hospital Stay: Payer: Medicare Other | Attending: Oncology | Admitting: Adult Health

## 2020-12-07 ENCOUNTER — Other Ambulatory Visit: Payer: Self-pay | Admitting: *Deleted

## 2020-12-07 ENCOUNTER — Encounter: Payer: Self-pay | Admitting: Adult Health

## 2020-12-07 VITALS — BP 176/85 | HR 95 | Temp 97.9°F | Resp 18 | Ht 66.0 in | Wt 212.1 lb

## 2020-12-07 DIAGNOSIS — Z79818 Long term (current) use of other agents affecting estrogen receptors and estrogen levels: Secondary | ICD-10-CM | POA: Insufficient documentation

## 2020-12-07 DIAGNOSIS — C50412 Malignant neoplasm of upper-outer quadrant of left female breast: Secondary | ICD-10-CM | POA: Insufficient documentation

## 2020-12-07 DIAGNOSIS — Z9221 Personal history of antineoplastic chemotherapy: Secondary | ICD-10-CM | POA: Insufficient documentation

## 2020-12-07 DIAGNOSIS — Z923 Personal history of irradiation: Secondary | ICD-10-CM | POA: Insufficient documentation

## 2020-12-07 DIAGNOSIS — F419 Anxiety disorder, unspecified: Secondary | ICD-10-CM | POA: Insufficient documentation

## 2020-12-07 DIAGNOSIS — Z171 Estrogen receptor negative status [ER-]: Secondary | ICD-10-CM | POA: Diagnosis not present

## 2020-12-07 DIAGNOSIS — N189 Chronic kidney disease, unspecified: Secondary | ICD-10-CM | POA: Insufficient documentation

## 2020-12-07 DIAGNOSIS — I130 Hypertensive heart and chronic kidney disease with heart failure and stage 1 through stage 4 chronic kidney disease, or unspecified chronic kidney disease: Secondary | ICD-10-CM | POA: Diagnosis not present

## 2020-12-07 DIAGNOSIS — Z23 Encounter for immunization: Secondary | ICD-10-CM | POA: Diagnosis not present

## 2020-12-07 LAB — CBC WITH DIFFERENTIAL/PLATELET
Abs Immature Granulocytes: 0.01 10*3/uL (ref 0.00–0.07)
Basophils Absolute: 0 10*3/uL (ref 0.0–0.1)
Basophils Relative: 1 %
Eosinophils Absolute: 0.1 10*3/uL (ref 0.0–0.5)
Eosinophils Relative: 1 %
HCT: 42.1 % (ref 36.0–46.0)
Hemoglobin: 13.8 g/dL (ref 12.0–15.0)
Immature Granulocytes: 0 %
Lymphocytes Relative: 39 %
Lymphs Abs: 2.5 10*3/uL (ref 0.7–4.0)
MCH: 29 pg (ref 26.0–34.0)
MCHC: 32.8 g/dL (ref 30.0–36.0)
MCV: 88.4 fL (ref 80.0–100.0)
Monocytes Absolute: 0.4 10*3/uL (ref 0.1–1.0)
Monocytes Relative: 6 %
Neutro Abs: 3.3 10*3/uL (ref 1.7–7.7)
Neutrophils Relative %: 53 %
Platelets: 218 10*3/uL (ref 150–400)
RBC: 4.76 MIL/uL (ref 3.87–5.11)
RDW: 14 % (ref 11.5–15.5)
WBC: 6.3 10*3/uL (ref 4.0–10.5)
nRBC: 0 % (ref 0.0–0.2)

## 2020-12-07 LAB — COMPREHENSIVE METABOLIC PANEL
ALT: 33 U/L (ref 0–44)
AST: 25 U/L (ref 15–41)
Albumin: 3.9 g/dL (ref 3.5–5.0)
Alkaline Phosphatase: 76 U/L (ref 38–126)
Anion gap: 10 (ref 5–15)
BUN: 14 mg/dL (ref 6–20)
CO2: 24 mmol/L (ref 22–32)
Calcium: 9.1 mg/dL (ref 8.9–10.3)
Chloride: 108 mmol/L (ref 98–111)
Creatinine, Ser: 1.03 mg/dL — ABNORMAL HIGH (ref 0.44–1.00)
GFR, Estimated: >60 mL/min (ref >60)
Glucose, Bld: 104 mg/dL — ABNORMAL HIGH (ref 70–99)
Potassium: 3.9 mmol/L (ref 3.5–5.1)
Sodium: 142 mmol/L (ref 135–145)
Total Bilirubin: <0.2 mg/dL — ABNORMAL LOW (ref 0.3–1.2)
Total Protein: 7.9 g/dL (ref 6.5–8.1)

## 2020-12-07 MED ORDER — GOSERELIN ACETATE 3.6 MG ~~LOC~~ IMPL
3.6000 mg | DRUG_IMPLANT | SUBCUTANEOUS | Status: DC
  Administered 2020-12-07: 3.6 mg via SUBCUTANEOUS
  Filled 2020-12-07: qty 3.6

## 2020-12-07 MED ORDER — PNEUMOCOCCAL 20-VAL CONJ VACC 0.5 ML IM SUSY: 0.5000 mL | PREFILLED_SYRINGE | Freq: Once | INTRAMUSCULAR | Status: DC

## 2020-12-07 MED ORDER — PNEUMOCOCCAL VAC POLYVALENT 25 MCG/0.5ML IJ INJ
0.5000 mL | INJECTION | Freq: Once | INTRAMUSCULAR | Status: AC
  Administered 2020-12-07: 0.5 mL via INTRAMUSCULAR
  Filled 2020-12-07: qty 0.5

## 2020-12-07 MED ORDER — PNEUMOCOCCAL VAC POLYVALENT 25 MCG/0.5ML IJ INJ
0.5000 mL | INJECTION | Freq: Once | INTRAMUSCULAR | Status: DC
  Filled 2020-12-07: qty 0.5

## 2020-12-07 NOTE — Progress Notes (Addendum)
[facility]  [phone] [phone]   ID: ELESE RANE   DOB: [birth year]  MR#: [id number]  XQJ#:[id number]  Patient Care Team: [name], MD as PCP - General (Internal Medicine) Skeet Latch, MD as PCP - Cardiology (Cardiology) Magrinat, Virgie Dad, MD as Referring Physician (Hematology and Oncology) [name], MD (Inactive) as Consulting Physician (Cardiology) Ross Marcus, PA-C (Neurology) Tanda Rockers, MD as Consulting Physician (Pulmonary Disease) Chancy Milroy, MD as Consulting Physician (Obstetrics and Gynecology) Alda Berthold, DO as Consulting Physician (Neurology) Pyrtle, Lajuan Lines, MD as Consulting Physician (Gastroenterology) [name], MD as Consulting Physician (Cardiology) Delice Bison, Charlestine Massed, NP as Nurse Practitioner (Hematology and Oncology) OTHER MD:    CHIEF COMPLAINT:  Triple negative Breast Cancer  CURRENT TREATMENT: Goserelin monthly   INTERVAL HISTORY: Erica [name] returns today for follow-up of her estrogen receptor negative breast cancer. She continues under observation.  She continues to receive goserelin every 4 weeks.  She tolerates this well.  The working plan is for her to remain on Goserelin every 4 weeks until she turns 48, which is in 3 years.    Erica [name] is doing well today.  She underwent mammogram of her breast bilateral on 08/30/2020 that showed no evidence of malignancy and breast category B.  She was having some chest pain and underwent a cardiac catheterization.  She was noted to have increased anxiety.  She has underlying cardiac and pulmonary issues.  Since undergoing this procedure however she was cleared to exercise again which she plans on doing.  Otherwise, she is doing well.   REVIEW OF SYSTEMS: Review of Systems  Constitutional:  Positive for fatigue. Negative for appetite change, chills, fever and unexpected weight change.  HENT:   Negative for hearing loss, lump/mass  and trouble swallowing.   Eyes:  Negative for eye problems and icterus.  Respiratory:  Negative for chest tightness, cough and shortness of breath.   Cardiovascular:  Negative for chest pain, leg swelling and palpitations.  Gastrointestinal:  Negative for abdominal distention, abdominal pain, constipation, diarrhea, nausea and vomiting.  Endocrine: Negative for hot flashes.  Genitourinary:  Negative for difficulty urinating.   Musculoskeletal:  Negative for arthralgias.  Skin:  Negative for itching and rash.  Neurological:  Negative for dizziness, extremity weakness, headaches and numbness.  Hematological:  Negative for adenopathy. Does not bruise/bleed easily.  Psychiatric/Behavioral:  Negative for depression. The patient is not nervous/anxious.    Marland Kitchen  COVID 19 VACCINATION STATUS:    BREAST CANCER HISTORY: From the original intake note:  Erica [name] noticed a lump in her left breast sometime in August or September 2009.  Initially this was thought to possibly be "dry breast milk" but as the mass got bigger, she brought it to her physician's attention and was set up for mammography.  This was performed May 20, 2008 at [facility] and it showed a palpable 8.5 cm oval circumscribed mass in the left breast upper outer quadrant.  There were prominent left axillary lymph nodes noted as well.  An ultrasound showed this to be hypoechoic, with lobulated margins and probable central necrosis measuring up to 7.3 cm on this modality.  Ultrasound of the left axilla demonstrated numerous prominent lymph nodes at the upper limit of normal, the largest measuring 1 cm with a normal appearing thin cortex and a normal cortex to halo ratio.    Because the patient had no insurance, she was referred to [facility] and biopsy was  performed in [place] with the pathology there ([id number]) showing a poorly differentiated malignancy which was triple negative (ER 1%, PR 0%, HER-2/neu 0).  The axillary lymph nodes were  not biopsied. With this information, the patient was presented at the Breast Multidisciplinary Conference and the feeling was that, after appropriate staging, the patient would be a good candidate for neoadjuvant treatment and possibly NSABP B40.    Her subsequent history is as detailed below   PAST MEDICAL HISTORY: Past Medical History:  Diagnosis Date   Allergy    Anxiety    Apnea, sleep 05/04/2014   AHI 31/hr now on CPAP at 12cm H2O   Arthritis    Asthma    Asthma without status asthmaticus 01/19/2016   Axillary hidradenitis suppurativa 08/01/2011   Blood transfusion without reported diagnosis    Breast cancer ([place]) 2010   a. locally advanced left breast carcinoma diagnosed in 2010 and treated with neoadjuvant chemotherapy with docetaxel and cyclophosphamide as well as paclitaxel. This was followed by radiation therapy which was completed in 2011.   Chemotherapy-induced peripheral neuropathy ([place]) 12/09/2016   CHF (congestive heart failure) (HCC)    Chronic diastolic CHF (congestive heart failure) ([place]) 06/23/2013   Chronic kidney disease    Chronic pain in left foot 06/29/2018   Chronic stable angina ([place]) 08/22/2020   Cough variant asthma 09/27/2010   Followed in Pulmonary clinic/ [facility]/ [place]  Onset reported in childhood  - PFT's 01/31/2011 FEV1  1.76 (60% ) ratio 68 and no better p B2,  DLCO 70% -med review 09/25/2012 > [place] drug assistance  paperwork given    - HFA 90% 11/20/2012 .  -Med calendar 12/07/12 , 04/06/2013 , 04/18/2014,, 08/30/2014 , 11/08/2014  - PFT's 01/12/2013  1.73 (65%)  Ratio 73 and no better p B2, DLCO 86% - trial off    Depression with somatization 06/15/2015   Dyspnea on exertion    a. 01/2013 Lexi MV: EF 54%, no ischemia/infarct;  b. 05/2013 Echo: EF 60-65%, no rwma, Gr 2 DD;  b.    Essential hypertension 10/09/2014   Estimated Creatinine Clearance: 82.4 mL/min (by C-G formula based on SCr of 0.98 mg/dL).   Fatty liver    Gastroparesis    GERD (gastroesophageal  reflux disease)    Hyperglycemia 04/13/2011   Hypertension    Hypertensive heart disease 09/26/2015   Hypertriglyceridemia without hypercholesterolemia 06/15/2015   Framingham risk score is 1%    Idiopathic intracranial hypertension 10/21/2014   Impaired glucose tolerance 04/13/2011   Insomnia secondary to anxiety 04/05/2015   Lymphadenitis, chronic    restricted LEFT extremity   Malignant neoplasm of upper-outer quadrant of left breast in female, estrogen receptor negative ([place]) 01/07/2013   Stage III, Invasive Ductal, s/p partial Left Mastectomy/Lumpectomy (baseball sized) with Lymph Node dissection, had Chemo and RadRx     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[place]) 09/27/2010   OSA (obstructive sleep apnea) 05/04/2014   AHI 31/hr now on CPAP at 12cm H2O   Oxygen deficiency    Uses 02 in CPAP   Persistent proteinuria 03/31/2018   Personal history of chemotherapy 2010   Personal history of radiation therapy 2010   Sleep apnea    SVT (supraventricular tachycardia) ([place]) 08/22/2020   Tibialis posterior tendinitis, left 07/15/2018   Injected in March 10, 2019   Upper airway cough syndrome 10/09/2014   Onset around 2012 with assoc pseudowheeze MRI 05/18/15 > Paranasal sinuses are clear - 11/17/2015 rec increase h1 to 2 hs to help with  noct cough  - Allergy profile 11/17/2015 >  Eos 0.1 /  IgE  80 POS RAST  to grass, ragweed, cedar tree  - d/c acei 03/06/2018  - flare end of Jan 2020 in setting of ? Uri/ poorly controlled GERD - flare mid Oct 2020 with uri / assoc secondary gerd   Vitamin D deficiency disease 03/31/2018    PAST SURGICAL HISTORY: Past Surgical History:  Procedure Laterality Date   BRAIN SURGERY     Shunt   BREAST LUMPECTOMY Left 09/2008   CARPAL TUNNEL RELEASE Left 2011   LYMPH NODE DISSECTION Left 2010   breast; 2 wks after breast lumpectomy   RIGHT/LEFT HEART CATH AND CORONARY ANGIOGRAPHY N/A 05/04/2019   Procedure: RIGHT/LEFT HEART CATH AND CORONARY ANGIOGRAPHY;  Surgeon: Jolaine Artist,  MD;  Location: [facility];  Service: Cardiovascular;  Laterality: N/A;  Renal injection preformed    RIGHT/LEFT HEART CATH AND CORONARY ANGIOGRAPHY N/A 10/25/2020   Procedure: RIGHT/LEFT HEART CATH AND CORONARY ANGIOGRAPHY;  Surgeon: Leonie Man, MD;  Location: [facility];  Service: Cardiovascular;  Laterality: N/A;   TENDON RELEASE Right 2012   "hand"   TUBAL LIGATION  05/11/2011   Procedure: POST PARTUM TUBAL LIGATION;  Surgeon: Emeterio Reeve, MD;  Location: [facility];  Service: Gynecology;  Laterality: Bilateral;  Induced for HTN    FAMILY HISTORY Family History  Problem Relation Age of Onset   Other Mother 36       breast calcifications treated with surgery, breast cancer pill, and radiation   Fibroids Sister 12       s/p TAH-BSO   Diabetes Maternal Grandmother    Heart disease Maternal Grandmother    Hypertension Maternal Grandmother    CAD Maternal Grandmother    Breast cancer Other        maternal great aunt (MGM's sister)   Breast cancer Other        paternal great aunt dx middle ages; s/p mastectomy   Cancer Neg Hx    Alcohol abuse Neg Hx    Early death Neg Hx    Hyperlipidemia Neg Hx    Kidney disease Neg Hx    Stroke Neg Hx    Colon cancer Neg Hx    Esophageal cancer Neg Hx    Stomach cancer Neg Hx    Rectal cancer Neg Hx    Colon polyps Neg Hx   The patient's parents are alive. The patient has one brother and one sister.  There is no ovarian or breast cancer in the immediate family, but the patient's maternal grandmother, who was one of 35 sisters, had one sister with breast cancer diagnosed in her 99.     GYNECOLOGIC HISTORY: updated January 2015 The patient is GX P4, first pregnancy to term at age 45.  She stopped having periods at the time of chemotherapy, and had had no periods for  2 years, then became pregnant in 2012. This was a surprise to her, and she was not aware of the pregnancy until the seventh month. She has not resumed normal  menses, but has had some "spotting" on and off for the last several months.   SOCIAL HISTORY: updated March 2021 She has been a Dance movement psychotherapist in the past. She's currently disabled. She splits her time between [place] and [place], [place]. She describes herself as single.  At home is just she and her daughter Erica [name], who works as a Quarry manager.  Latecia has 3 other  children, all younger.   The patient is a Psychologist, forensic    ADVANCED DIRECTIVES: Not in place  HEALTH MAINTENANCE:   Social History   Tobacco Use   Smoking status: Never   Smokeless tobacco: Never  Vaping Use   Vaping Use: Never used  Substance Use Topics   Alcohol use: Yes    Alcohol/week: 0.0 standard drinks    Comment: occasionally/socially   Drug use: No     Allergies  Allergen Reactions   Aspirin Shortness Of Breath and Palpitations    Pt can take ibuprofen without reaction   Doxycycline Anaphylaxis   Kiwi Extract Itching and Swelling    Lip swelling   Metoprolol Hives and Itching   Penicillins Shortness Of Breath and Palpitations    Has patient had a PCN reaction causing immediate rash, facial/tongue/throat swelling, SOB or lightheadedness with hypotension: Yes Has patient had a PCN reaction causing severe rash involving mucus membranes or skin necrosis: Yes Has patient had a PCN reaction that required hospitalization Yes Has patient had a PCN reaction occurring within the last 10 years: No If all of the above answers are "NO", then may proceed with Cephalosporin use.   Strawberry Extract Hives   Sulfamethoxazole-Trimethoprim Anaphylaxis    Facial, throat and tongue swelling with difficulty swallowing.    Current Outpatient Medications  Medication Sig Dispense Refill   acetaZOLAMIDE (DIAMOX) 250 MG tablet Take 4 tablets (1,000 mg total) by mouth 3 (three) times daily. 1080 tablet 3   albuterol (PROVENTIL) (2.5 MG/3ML) 0.083% nebulizer solution USE 1 VIAL VIA NEBULIZER EVERY 4 HOURS AND AS NEEDED FOR WHEEZING OR  SHORTNESS OF BREATH (Patient taking differently: Take 2.5 mg by nebulization every 6 (six) hours as needed for wheezing or shortness of breath.) 75 mL 1   albuterol (VENTOLIN HFA) 108 (90 Base) MCG/ACT inhaler Inhale 2 puffs into the lungs every 6 (six) hours as needed for wheezing or shortness of breath. 18 each 3   AMBULATORY NON FORMULARY MEDICATION Carpal Tunnel Wrist brace. Rt  Carpal Tunnel Syndrome G56.01  Disp 1 1 each 0   AMBULATORY NON FORMULARY MEDICATION Lace up ankle brace (ASO type)  Ankle sprain S93.491A  Disp 1 1 each 0   amitriptyline (ELAVIL) 100 MG tablet TAKE 1 TABLET BY MOUTH EVERYDAY AT BEDTIME (Patient taking differently: Take 100 mg by mouth at bedtime.) 90 tablet 0   amLODipine (NORVASC) 10 MG tablet Take 1 tablet (10 mg total) by mouth daily. 180 tablet 3   Biotin 5000 MCG TABS Take 5,000 mg by mouth 2 (two) times daily.      budesonide-formoterol (SYMBICORT) 160-4.5 MCG/ACT inhaler INHALE 2 PUFFS INTO LUNGS 2 TIMES DAILY AS DIRECTED (Patient taking differently: Inhale 2 puffs into the lungs 2 (two) times daily.) 10.2 g 11   Cholecalciferol 1.25 MG (50000 UT) capsule Take 1 capsule (50,000 Units total) by mouth once a week. (Patient taking differently: Take 4,000 Units by mouth daily. 2000 mg each  gummies) 12 capsule 1   cloNIDine (CATAPRES - DOSED IN MG/24 HR) 0.3 mg/24hr patch PLACE 2 PATCHES ONTO SKIN ONCE WEEKLY (Patient taking differently: Place 0.6 mg onto the skin See admin instructions. PLACE 2 PATCHES ONTO SKIN ONCE WEEKLY) 24 patch 1   clopidogrel (PLAVIX) 75 MG tablet Take 1 tablet (75 mg total) by mouth daily. 90 tablet 3   clotrimazole (MYCELEX) 10 MG troche Take 1 tablet (10 mg total) by mouth 5 (five) times daily. 30 tablet 11   dexlansoprazole (DEXILANT)  60 MG capsule Take 1 capsule (60 mg total) by mouth daily. 90 capsule 2   dextromethorphan-guaiFENesin (MUCINEX DM) 30-600 MG 12hr tablet Take 2 tablets by mouth 2 (two) times daily.     diltiazem  (CARDIZEM CD) 240 MG 24 hr capsule Take 1 capsule (240 mg total) by mouth daily. 90 capsule 3   doxazosin (CARDURA) 8 MG tablet Take 1 tablet (8 mg total) by mouth 2 (two) times daily. (Patient taking differently: Take 8 mg by mouth at bedtime.) 180 tablet 3   DULoxetine (CYMBALTA) 30 MG capsule TAKE 1 CAPSULE (30 MG TOTAL) BY MOUTH DAILY. WITH 60MG FOR TOTAL OF 90 MG (Patient taking differently: Take 30 mg by mouth See admin instructions. With 82m for total of 90 mg) 30 capsule 3   DULoxetine (CYMBALTA) 60 MG capsule TAKE 1 CAPSULE (60 MG TOTAL) BY MOUTH DAILY. FOR TOTAL OF 90 MG (Patient taking differently: Take 60 mg by mouth See admin instructions. Take with 30 mg for a total of 90 mg in the morning) 30 capsule 3   ENTRESTO 97-103 MG TAKE 1 TABLET BY MOUTH TWICE A DAY 60 tablet 5   EPINEPHRINE 0.3 mg/0.3 mL IJ SOAJ injection INJECT 0.3 MLS INTO THE MUSCLE ONCE AS NEEDED FOR ANAPHYLAXIS (Patient taking differently: Inject 0.3 mg into the muscle as needed for anaphylaxis.) 2 Device 2   ezetimibe (ZETIA) 10 MG tablet Take 1 tablet (10 mg total) by mouth daily. 90 tablet 3   famotidine (PEPCID) 20 MG tablet Take 1-2 tablets (20-40 mg total) by mouth at bedtime. (Patient taking differently: Take 40 mg by mouth at bedtime.) 180 tablet 2   gabapentin (NEURONTIN) 300 MG capsule Take 1 capsule (300 mg total) by mouth 3 (three) times daily. One three times daily (Patient taking differently: Take 300 mg by mouth 3 (three) times daily.) 90 capsule 2   hydrALAZINE (APRESOLINE) 100 MG tablet Take 1 tablet (100 mg total) by mouth 3 (three) times daily. 270 tablet 3   hydrochlorothiazide (HYDRODIURIL) 25 MG tablet Take 1 tablet (25 mg total) by mouth daily. 90 tablet 3   lidocaine-prilocaine (EMLA) cream Apply 1 application topically every 28 (twenty-eight) days. Apply 1 application to skin ever 28 days     LORazepam (ATIVAN) 0.5 MG tablet 1-2 tabs 30 - 60 min prior to MRI. Do not drive with this medicine. 4 tablet  0   metoCLOPramide (REGLAN) 10 MG tablet TAKE 1 TABLET BY MOUTH 4 TIMES DAILY. (Patient taking differently: Take 10 mg by mouth 4 (four) times daily -  before meals and at bedtime.) 360 tablet 0   minoxidil (LONITEN) 2.5 MG tablet Take 2 tablets (5 mg total) by mouth 2 (two) times daily. 120 tablet 3   montelukast (SINGULAIR) 10 MG tablet TAKE 1 TABLET BY MOUTH EVERYDAY AT BEDTIME (Patient taking differently: Take 10 mg by mouth at bedtime.) 90 tablet 3   Nebivolol HCl 20 MG TABS Take 40 mg by mouth at bedtime.     nitroGLYCERIN (NITROSTAT) 0.4 MG SL tablet Place 1 tablet (0.4 mg total) under the tongue every 5 (five) minutes as needed for chest pain. 25 tablet 2   oxymetazoline (MUCINEX SINUS-MAX FULL FORCE) 0.05 % nasal spray Place 1 spray into both nostrils 2 (two) times daily.     PEG-KCl-NaCl-NaSulf-Na Asc-C (PLENVU) 140 g SOLR Take 1 kit by mouth as directed. Manufacturer's coupon Universal coupon code:BIN: [id number] GROUP:: [id number] PCN: [id number]; ID:: [id number] PAY NO MORE $50; NO prior authorization  1 each 0   Potassium Chloride ER 20 MEQ TBCR TAKE 1 TABLET BY MOUTH TWICE A DAY (Patient taking differently: Take 20 mEq by mouth daily.) 180 tablet 0   primidone (MYSOLINE) 50 MG tablet Take 0.5 tablets (25 mg total) by mouth at bedtime. 45 tablet 3   promethazine (PHENERGAN) 25 MG suppository Place 25 mg rectally every 6 (six) hours as needed for nausea or vomiting.      promethazine (PHENERGAN) 25 MG tablet TAKE 1 TABLET (25 MG TOTAL) BY MOUTH 3 (THREE) TIMES DAILY AS NEEDED FOR NAUSEA OR VOMITING. 270 tablet 1   promethazine-dextromethorphan (PROMETHAZINE-DM) 6.25-15 MG/5ML syrup TAKE 5 MLS BY MOUTH 4 (FOUR) TIMES DAILY AS NEEDED FOR COUGH. 473 mL 2   ranolazine (RANEXA) 1000 MG SR tablet Take 1 tablet (1,000 mg total) by mouth 2 (two) times daily. 180 tablet 3   Respiratory Therapy Supplies (FLUTTER) DEVI Use as directed 1 each 0   rosuvastatin (CRESTOR) 40 MG tablet Take 1 tablet (40 mg total)  by mouth daily. 90 tablet 3   Spacer/Aero-Holding Chambers (AEROCHAMBER MV) inhaler Use as instructed 1 each 0   spironolactone (ALDACTONE) 100 MG tablet Take 1 tablet (100 mg total) by mouth daily. 90 tablet 3   torsemide (DEMADEX) 20 MG tablet Take 1 tablet (20 mg total) by mouth 2 (two) times daily. 180 tablet 3   Current Facility-Administered Medications  Medication Dose Route Frequency Provider Last Rate Last Admin   sodium chloride flush (NS) 0.9 % injection 3 mL  3 mL Intravenous Q12H Loel Dubonnet, NP       Facility-Administered Medications Ordered in Other Visits  Medication Dose Route Frequency Provider Last Rate Last Admin   goserelin (ZOLADEX) injection 3.6 mg  3.6 mg Subcutaneous Q28 days Magrinat, Virgie Dad, MD   3.6 mg at 06/01/20 1009    OBJECTIVE:  African-American woman in no acute distress  Vitals:   12/07/20 0821  BP: (!) 176/85  Pulse: 95  Resp: 18  Temp: 97.9 F (36.6 C)  SpO2: 99%   Wt Readings from Last 3 Encounters:  12/07/20 212 lb 1.6 oz (96.2 kg)  11/27/20 209 lb 6.4 oz (95 kg)  11/13/20 210 lb 6.4 oz (95.4 kg)   Body mass index is 34.23 kg/m.    ECOG FS:2 - Symptomatic, <50% confined to bed GENERAL: Patient is a well appearing female in no acute distress HEENT:  Sclerae anicteric.  Oropharynx clear and moist. No ulcerations or evidence of oropharyngeal candidiasis. Neck is supple.  NODES:  No cervical, supraclavicular, or axillary lymphadenopathy palpated.  BREAST EXAM:  left breast s/p lumpectomy and radiation, no sign of local recurrence, right breast benign LUNGS:  Clear to auscultation bilaterally.  No wheezes or rhonchi. HEART:  Regular rate and rhythm. No murmur appreciated. ABDOMEN:  Soft, nontender.  Positive, normoactive bowel sounds. No organomegaly palpated. MSK:  No focal spinal tenderness to palpation. Full range of motion bilaterally in the upper extremities. EXTREMITIES:  No peripheral edema.   SKIN:  Clear with no obvious  rashes or skin changes. No nail dyscrasia. NEURO:  Nonfocal. Well oriented.  Appropriate affect.    LAB RESULTS: Lab Results  Component Value Date   WBC 6.3 12/07/2020   NEUTROABS 3.3 12/07/2020   HGB 13.8 12/07/2020   HCT 42.1 12/07/2020   MCV 88.4 12/07/2020   PLT 218 12/07/2020      Chemistry      Component Value Date/Time   NA 143 11/20/2020  1103   NA 142 03/17/2017 0835   K 3.8 11/20/2020 1103   K 3.6 03/17/2017 0835   CL 104 11/20/2020 1103   CL 103 09/10/2012 1618   CO2 25 11/20/2020 1103   CO2 27 03/17/2017 0835   BUN 13 11/20/2020 1103   BUN 10.1 03/17/2017 0835   CREATININE 0.91 11/20/2020 1103   CREATININE 1.27 (H) 12/09/2018 1222   CREATININE 0.9 03/17/2017 0835      Component Value Date/Time   CALCIUM 9.4 11/20/2020 1103   CALCIUM 9.5 03/17/2017 0835   ALKPHOS 67 08/22/2020 0912   ALKPHOS 77 03/17/2017 0835   AST 30 08/22/2020 0912   AST 24 12/09/2018 1222   AST 17 03/17/2017 0835   ALT 35 (H) 08/22/2020 0912   ALT 27 12/09/2018 1222   ALT 24 03/17/2017 0835   BILITOT 0.4 08/22/2020 0912   BILITOT 0.4 12/09/2018 1222   BILITOT 0.50 03/17/2017 0835      STUDIES: No results found.   ASSESSMENT: 45 y.o. [place] woman with history of locally advanced left breast carcinoma initially presenting in March 2010   (1) treated neoadjuvantly with 4 cycles of docetaxel/ cyclophosphamide.    (2) status post left lumpectomy and sentinel lymph node dissection in July 2010 for a ypT2 ypN1, stage IIB invasive ductal carcinoma, grade 3, triple negative, with MIB-1 of 88%.   (3) status post full axillary dissection and margin clearance in August 2010.   (4) status post 11 doses of weekly paclitaxel completed in December 2010   (5) radiation therapy completed in March 2011; now followed with observation alone.   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  She has no clinical or radiographic sign of breast cancer recurrence, which is great news.  She will be due for a repeat mammogram in June 2023.    Erica [name] met with Dr. [name] as well today too who reviewed her overall plan.  She will continue to f/u with me in survivorship, and she will continue on Gosereline every 4 weeks until she reaches the age of 17, at which point we will get labs to determine if she is in menopause.    Erica [name] and I discussed healthy diet and exercise per her cardiologists direction.  She is due for a pneumonia vaccine today which I ordered.    We will see Erica [name] back in 1 year for her f/u and lab work.  She knows to call for any questions that may arise between now and her next appointment.  We are happy to see her sooner if needed.  Wilber Bihari, NP 12/09/20 7:36 AM Medical Oncology and Hematology [facility] [place],  [address] Tel. [phone]    Fax. [phone]   ADDENDUM: Erica [name] is now a little over 12 years from definitive surgery for her breast cancer, with no evidence of recurrence. This is very favorable.  She receives goserelin here and is tolerating that well. This is associated with accelerated bone loss and she has ot had a bone density for some time. I added that order for it to be done same time as her next mammogram June 2023.  I  personally saw this patient and performed a substantive portion of this encounter with the listed APP documented above.   Chauncey Cruel, MD Medical Oncology and Hematology [facility] [address] [place], [place] [address] Tel. [phone]    Fax. [phone]    *Total Encounter Time as defined by  the Centers for Medicare and Medicaid Services includes, in addition to the face-to-face time of a patient visit (documented in the note above) non-face-to-face time: obtaining and reviewing outside history, ordering and reviewing medications, tests or procedures, care coordination (communications with other health care professionals or caregivers) and documentation in the medical record.

## 2020-12-08 ENCOUNTER — Encounter: Payer: Self-pay | Admitting: Adult Health

## 2020-12-08 ENCOUNTER — Telehealth: Payer: Self-pay | Admitting: Adult Health

## 2020-12-08 NOTE — Telephone Encounter (Signed)
Scheduled appt per 9/8 los - unable to reach pt . Left message for patient with appt date and time on vmail.

## 2020-12-11 ENCOUNTER — Encounter: Payer: Self-pay | Admitting: Oncology

## 2020-12-21 ENCOUNTER — Encounter (HOSPITAL_BASED_OUTPATIENT_CLINIC_OR_DEPARTMENT_OTHER): Payer: Self-pay | Admitting: Family

## 2020-12-26 ENCOUNTER — Telehealth (HOSPITAL_BASED_OUTPATIENT_CLINIC_OR_DEPARTMENT_OTHER): Payer: Self-pay | Admitting: Family

## 2020-12-26 NOTE — Telephone Encounter (Signed)
Received fax from [facility] for Medicare and Medicaid Services, Certificate of Medical Necessity for Oxygen.  Per Laurann [place], NP this should be evaluated by Pulmonology, Dr. Melvyn Novas. Pt has not been seen there since last year and will need a follow-up appointment.  Left detailed message for pt--ok per DPR--explaining the above info and let her know I would send message to Dr. Gustavus Bryant office to make them aware and see if we need to fax the form to them. Asked pt to call back with any questions/concerns.  Routing to [place], NP and Dr. Melvyn Novas as Juluis Rainier.

## 2020-12-26 NOTE — Telephone Encounter (Signed)
Patient returning call.

## 2020-12-26 NOTE — Telephone Encounter (Signed)
Pt returned my call stating this is actually being handled by Dr. Radford Pax. Oxygen prescribed for pt along with BiPAP and for use during the day as needed for SOB.   Routing to Dr. Radford Pax to advise--will fax form to [place] office for her review.

## 2020-12-28 NOTE — Telephone Encounter (Signed)
Patient states she only needs the paperwork filled out for her insurance that should be sent to dr Radford Pax and faxed back.

## 2021-01-01 ENCOUNTER — Other Ambulatory Visit: Payer: Self-pay

## 2021-01-01 ENCOUNTER — Ambulatory Visit (INDEPENDENT_AMBULATORY_CARE_PROVIDER_SITE_OTHER): Payer: Medicare Other | Admitting: Psychologist

## 2021-01-01 DIAGNOSIS — F411 Generalized anxiety disorder: Secondary | ICD-10-CM | POA: Diagnosis not present

## 2021-01-01 DIAGNOSIS — F321 Major depressive disorder, single episode, moderate: Secondary | ICD-10-CM

## 2021-01-04 ENCOUNTER — Inpatient Hospital Stay: Payer: Medicare Other | Attending: Oncology

## 2021-01-04 ENCOUNTER — Other Ambulatory Visit: Payer: Self-pay

## 2021-01-04 VITALS — BP 179/93 | HR 69 | Temp 98.6°F | Resp 18

## 2021-01-04 DIAGNOSIS — C50412 Malignant neoplasm of upper-outer quadrant of left female breast: Secondary | ICD-10-CM | POA: Insufficient documentation

## 2021-01-04 DIAGNOSIS — Z79818 Long term (current) use of other agents affecting estrogen receptors and estrogen levels: Secondary | ICD-10-CM | POA: Insufficient documentation

## 2021-01-04 MED ORDER — GOSERELIN ACETATE 3.6 MG ~~LOC~~ IMPL
3.6000 mg | DRUG_IMPLANT | SUBCUTANEOUS | Status: DC
  Administered 2021-01-04: 3.6 mg via SUBCUTANEOUS
  Filled 2021-01-04: qty 3.6

## 2021-01-04 NOTE — Patient Instructions (Signed)
Goserelin injection What is this medication? GOSERELIN (GOE se rel in) is similar to a hormone found in the body. It lowers the amount of sex hormones that the body makes. Men will have lower testosterone levels and women will have lower estrogen levels while taking this medicine. In men, this medicine is used to treat prostate cancer; the injection is either given once per month or once every 12 weeks. A once per month injection (only) is used to treat women with endometriosis, dysfunctional uterine bleeding, or advanced breast cancer. This medicine may be used for other purposes; ask your health care provider or pharmacist if you have questions. COMMON BRAND NAME(S): Zoladex What should I tell my care team before I take this medication? They need to know if you have any of these conditions: bone problems diabetes heart disease history of irregular heartbeat an unusual or allergic reaction to goserelin, other medicines, foods, dyes, or preservatives pregnant or trying to get pregnant breast-feeding How should I use this medication? This medicine is for injection under the skin. It is given by a health care professional in a hospital or clinic setting. Talk to your pediatrician regarding the use of this medicine in children. Special care may be needed. Overdosage: If you think you have taken too much of this medicine contact a poison control center or emergency room at once. NOTE: This medicine is only for you. Do not share this medicine with others. What if I miss a dose? It is important not to miss your dose. Call your doctor or health care professional if you are unable to keep an appointment. What may interact with this medication? Do not take this medicine with any of the following medications: cisapride dronedarone pimozide thioridazine This medicine may also interact with the following medications: other medicines that prolong the QT interval (an abnormal heart rhythm) This list  may not describe all possible interactions. Give your health care provider a list of all the medicines, herbs, non-prescription drugs, or dietary supplements you use. Also tell them if you smoke, drink alcohol, or use illegal drugs. Some items may interact with your medicine. What should I watch for while using this medication? Visit your doctor or health care provider for regular checks on your progress. Your symptoms may appear to get worse during the first weeks of this therapy. Tell your doctor or healthcare provider if your symptoms do not start to get better or if they get worse after this time. Your bones may get weaker if you take this medicine for a long time. If you smoke or frequently drink alcohol you may increase your risk of bone loss. A family history of osteoporosis, chronic use of drugs for seizures (convulsions), or corticosteroids can also increase your risk of bone loss. Talk to your doctor about how to keep your bones strong. This medicine should stop regular monthly menstruation in women. Tell your doctor if you continue to menstruate. Women should not become pregnant while taking this medicine or for 12 weeks after stopping this medicine. Women should inform their doctor if they wish to become pregnant or think they might be pregnant. There is a potential for serious side effects to an unborn child. Talk to your health care professional or pharmacist for more information. Do not breast-feed an infant while taking this medicine. Men should inform their doctors if they wish to father a child. This medicine may lower sperm counts. Talk to your health care professional or pharmacist for more information. This medicine may   increase blood sugar. Ask your healthcare provider if changes in diet or medicines are needed if you have diabetes. What side effects may I notice from receiving this medication? Side effects that you should report to your doctor or health care professional as soon as  possible: allergic reactions like skin rash, itching or hives, swelling of the face, lips, or tongue bone pain breathing problems changes in vision chest pain feeling faint or lightheaded, falls fever, chills pain, swelling, warmth in the leg pain, tingling, numbness in the hands or feet signs and symptoms of high blood sugar such as being more thirsty or hungry or having to urinate more than normal. You may also feel very tired or have blurry vision signs and symptoms of low blood pressure like dizziness; feeling faint or lightheaded, falls; unusually weak or tired stomach pain swelling of the ankles, feet, hands trouble passing urine or change in the amount of urine unusually high or low blood pressure unusually weak or tired Side effects that usually do not require medical attention (report to your doctor or health care professional if they continue or are bothersome): change in sex drive or performance changes in breast size in both males and females changes in emotions or moods headache hot flashes irritation at site where injected loss of appetite skin problems like acne, dry skin vaginal dryness This list may not describe all possible side effects. Call your doctor for medical advice about side effects. You may report side effects to FDA at 1-800-FDA-1088. Where should I keep my medication? This drug is given in a hospital or clinic and will not be stored at home. NOTE: This sheet is a summary. It may not cover all possible information. If you have questions about this medicine, talk to your doctor, pharmacist, or health care provider.  2022 Elsevier/Gold Standard (2018-07-06 14:05:56)  

## 2021-01-10 ENCOUNTER — Ambulatory Visit (INDEPENDENT_AMBULATORY_CARE_PROVIDER_SITE_OTHER): Payer: Medicare Other | Admitting: Psychologist

## 2021-01-10 ENCOUNTER — Other Ambulatory Visit: Payer: Self-pay

## 2021-01-10 DIAGNOSIS — F411 Generalized anxiety disorder: Secondary | ICD-10-CM

## 2021-01-10 DIAGNOSIS — F321 Major depressive disorder, single episode, moderate: Secondary | ICD-10-CM | POA: Diagnosis not present

## 2021-01-17 ENCOUNTER — Other Ambulatory Visit: Payer: Self-pay

## 2021-01-17 ENCOUNTER — Ambulatory Visit (INDEPENDENT_AMBULATORY_CARE_PROVIDER_SITE_OTHER): Payer: Medicare Other | Admitting: Psychologist

## 2021-01-17 DIAGNOSIS — F411 Generalized anxiety disorder: Secondary | ICD-10-CM | POA: Diagnosis not present

## 2021-01-17 DIAGNOSIS — F321 Major depressive disorder, single episode, moderate: Secondary | ICD-10-CM | POA: Diagnosis not present

## 2021-01-20 ENCOUNTER — Other Ambulatory Visit: Payer: Self-pay | Admitting: Cardiovascular Disease

## 2021-02-01 ENCOUNTER — Inpatient Hospital Stay: Payer: Medicare Other | Attending: Oncology

## 2021-02-01 ENCOUNTER — Other Ambulatory Visit: Payer: Self-pay

## 2021-02-01 VITALS — BP 198/92 | HR 70 | Temp 99.2°F | Resp 16

## 2021-02-01 DIAGNOSIS — Z79818 Long term (current) use of other agents affecting estrogen receptors and estrogen levels: Secondary | ICD-10-CM | POA: Diagnosis present

## 2021-02-01 DIAGNOSIS — C50412 Malignant neoplasm of upper-outer quadrant of left female breast: Secondary | ICD-10-CM | POA: Insufficient documentation

## 2021-02-01 MED ORDER — GOSERELIN ACETATE 3.6 MG ~~LOC~~ IMPL
3.6000 mg | DRUG_IMPLANT | SUBCUTANEOUS | Status: DC
  Administered 2021-02-01: 3.6 mg via SUBCUTANEOUS
  Filled 2021-02-01: qty 3.6

## 2021-02-02 ENCOUNTER — Ambulatory Visit (INDEPENDENT_AMBULATORY_CARE_PROVIDER_SITE_OTHER): Payer: Medicare Other | Admitting: Psychologist

## 2021-02-02 DIAGNOSIS — F411 Generalized anxiety disorder: Secondary | ICD-10-CM

## 2021-02-02 DIAGNOSIS — F321 Major depressive disorder, single episode, moderate: Secondary | ICD-10-CM | POA: Diagnosis not present

## 2021-02-13 ENCOUNTER — Other Ambulatory Visit: Payer: Self-pay | Admitting: Cardiovascular Disease

## 2021-02-13 DIAGNOSIS — G932 Benign intracranial hypertension: Secondary | ICD-10-CM

## 2021-02-13 DIAGNOSIS — I10 Essential (primary) hypertension: Secondary | ICD-10-CM

## 2021-02-13 NOTE — Telephone Encounter (Signed)
Rx request sent to pharmacy.  

## 2021-02-14 ENCOUNTER — Other Ambulatory Visit: Payer: Self-pay | Admitting: Internal Medicine

## 2021-02-19 ENCOUNTER — Ambulatory Visit (INDEPENDENT_AMBULATORY_CARE_PROVIDER_SITE_OTHER): Payer: Medicare Other | Admitting: Psychologist

## 2021-02-19 ENCOUNTER — Other Ambulatory Visit: Payer: Self-pay

## 2021-02-19 DIAGNOSIS — F411 Generalized anxiety disorder: Secondary | ICD-10-CM

## 2021-02-19 DIAGNOSIS — F321 Major depressive disorder, single episode, moderate: Secondary | ICD-10-CM | POA: Diagnosis not present

## 2021-02-20 ENCOUNTER — Telehealth: Payer: Self-pay

## 2021-02-20 NOTE — Telephone Encounter (Signed)
Letter has been sent to patient instructing them to call us if they are still interested in completing their sleep study. If we have not received a response from the patient within 30 days of this notice, the order will be cancelled and they will need to discuss the need for a sleep study at their next office visit.  ° °

## 2021-03-01 ENCOUNTER — Other Ambulatory Visit (HOSPITAL_BASED_OUTPATIENT_CLINIC_OR_DEPARTMENT_OTHER): Payer: Self-pay

## 2021-03-01 ENCOUNTER — Ambulatory Visit: Payer: Medicare Other | Attending: Internal Medicine

## 2021-03-01 ENCOUNTER — Other Ambulatory Visit: Payer: Self-pay | Admitting: Internal Medicine

## 2021-03-01 ENCOUNTER — Inpatient Hospital Stay: Payer: Medicare Other | Attending: Oncology

## 2021-03-01 ENCOUNTER — Encounter: Payer: Self-pay | Admitting: Oncology

## 2021-03-01 ENCOUNTER — Other Ambulatory Visit: Payer: Self-pay | Admitting: Cardiovascular Disease

## 2021-03-01 ENCOUNTER — Encounter (HOSPITAL_BASED_OUTPATIENT_CLINIC_OR_DEPARTMENT_OTHER): Payer: Self-pay | Admitting: Cardiovascular Disease

## 2021-03-01 ENCOUNTER — Ambulatory Visit (INDEPENDENT_AMBULATORY_CARE_PROVIDER_SITE_OTHER): Payer: Medicare Other | Admitting: Cardiovascular Disease

## 2021-03-01 ENCOUNTER — Other Ambulatory Visit: Payer: Self-pay

## 2021-03-01 VITALS — BP 180/92 | HR 80 | Temp 98.6°F | Resp 18

## 2021-03-01 VITALS — BP 140/66 | HR 97 | Ht 66.0 in | Wt 219.9 lb

## 2021-03-01 DIAGNOSIS — I10 Essential (primary) hypertension: Secondary | ICD-10-CM | POA: Diagnosis not present

## 2021-03-01 DIAGNOSIS — C50412 Malignant neoplasm of upper-outer quadrant of left female breast: Secondary | ICD-10-CM

## 2021-03-01 DIAGNOSIS — Z5181 Encounter for therapeutic drug level monitoring: Secondary | ICD-10-CM | POA: Diagnosis not present

## 2021-03-01 DIAGNOSIS — E876 Hypokalemia: Secondary | ICD-10-CM

## 2021-03-01 DIAGNOSIS — Z79818 Long term (current) use of other agents affecting estrogen receptors and estrogen levels: Secondary | ICD-10-CM | POA: Insufficient documentation

## 2021-03-01 DIAGNOSIS — I5033 Acute on chronic diastolic (congestive) heart failure: Secondary | ICD-10-CM

## 2021-03-01 DIAGNOSIS — I5032 Chronic diastolic (congestive) heart failure: Secondary | ICD-10-CM

## 2021-03-01 DIAGNOSIS — Z23 Encounter for immunization: Secondary | ICD-10-CM

## 2021-03-01 MED ORDER — GOSERELIN ACETATE 3.6 MG ~~LOC~~ IMPL
3.6000 mg | DRUG_IMPLANT | SUBCUTANEOUS | Status: DC
  Administered 2021-03-01: 3.6 mg via SUBCUTANEOUS
  Filled 2021-03-01: qty 3.6

## 2021-03-01 MED ORDER — TORSEMIDE 40 MG PO TABS: 40.0000 mg | ORAL_TABLET | Freq: Two times a day (BID) | ORAL | 5 refills | Status: DC

## 2021-03-01 MED ORDER — METOLAZONE 5 MG PO TABS: ORAL_TABLET | ORAL | 0 refills | Status: DC

## 2021-03-01 MED ORDER — POTASSIUM CHLORIDE ER 20 MEQ PO TBCR: 2.0000 | EXTENDED_RELEASE_TABLET | Freq: Two times a day (BID) | ORAL | 5 refills | Status: DC

## 2021-03-01 MED ORDER — MODERNA COVID-19 BIVAL BOOSTER 50 MCG/0.5ML IM SUSP
INTRAMUSCULAR | 0 refills | Status: DC
  Filled 2021-03-01: qty 0.5, 1d supply, fill #0

## 2021-03-01 NOTE — Assessment & Plan Note (Signed)
Continue rosuvastatin and Zetia. 

## 2021-03-01 NOTE — Progress Notes (Signed)
Cardiology Office Note:    Date:  03/01/2021   ID:  JORETTA EADS, DOB 04/22/1975, MRN [id number]  PCP:  [name], MD  Cardiologist:  Skeet Latch, MD  Nephrologist:  Referring MD: [name], MD   CC: Hypertension  History of Present Illness:    Erica [name] is a 45 y.o. female with a hx of resistant hypertension, medically managed CAD, chronic diastolic heart failure, breast cancer s/p chemo and XRT, morbid obesity and OSA intolerant of CPAP here for follow-up. She initially established care in the hypertension clinic 04/10/2020. She reports that her BP has been elevated since she was treated for breast cancer in 2010.  Since then her blood pressure has never been very well have been controlled.  She has been on multiple antihypertensives and her blood pressure remains in the 656C to 127N systolic.  She struggles with exertional dyspnea that makes it hard for her to walk even short distances.  Echo 03/2018 revealed LVEF 45 to 50% with grade 2 diastolic dysfunction.  There was mild MR.  PASP was 33.  Erica [name] has a history of recurrent syncope.  She had an episode that occurred while wearing an ambulatory monitor and the episode was felt to be due to narcolepsy.  She has sleep apnea but is not tolerant of her CPAP machine.  She now uses her BIPAP machine but struggles with insomnia.  She wakes up hourly.  She underwent left and right heart catheterization on 05/04/2019.  She was found to have 60% distal left circumflex and 80% distal LAD disease.  Given her aspirin allergy Dr. Meda Coffee recommended that she start clopidogrel.  Right atrial pressure was 10, and PA pressure was 26/10.  PCWP 18.  LVEDP was 23 mmHg.  At her first hypertension clinic visit there were concerns about whether she was taking her medication as prescribed.  Discrepancies between what was ordered and what she was actually picking up from the pharmacy.  She continues to report taking all medications as  prescribed.  Her blood pressures continue to run quite elevated.  At her initial assessment doxazosin was added to her regimen.  This does not seem to have helped much.  She followed up with our pharmacist and was started on amlodipine.  She is unable to exercise due to poorly controlled blood pressure.  She has 1 caffeinated drink daily and does not use over-the-counter supplements or medications.  She continues to try to minimize her sodium intake.  Renal artery Dopplers revealed 1- 59% stenosis bilaterally in 2019.  CT-A of the abdomen showed a 1.3 cm renal cyst but no adrenal adenomas.  There was no evidence of renal artery stenosis.  Erica [name] saw our pharmacist on 12/2019.  She was in a lot of pain at the time.  Given her high number or medications and her pain, her medications were not changed.  She has struggled with LE edema.  LE Doppler was negative for DVT 10/2019.  She was given metolazone to take prior to torsemide.  For the last two months she has been more swollen but it is finally better.  Her weight has decreased from 211 lb to 187 lb.  Erica [name] continues to struggle with shortness of breath. Her edema resolved but there was no improvement in her breathing.  She uses her CPAP every other night.  She notes that there is a recall on her CPAP and she isn't supposed to be using it.  However,  she can't sleep well without it. Lately her BP at home has been in the 150-200s.  She notes that when she has pain it is higher.  She has a lot of joint pain, especially in her feel.    Erica [name] wanted to reduce her medications.  However her blood pressure was significantly elevated.  We reduced hydralazine to 50 mg 3 times a day and increase doxazosin to 8 mg twice daily in an effort to try and get her off of her 3 times daily medications and improve compliance.  Ranexa was increased due to chest pain.  This seems to have helped her chest pain.  It seems to occur randomly both at rest and with exertion.  Her  clonidine was increased to 0.6 mg every 24 hours. She wore an  ambulatory monitor that showed PVCs and up to 8 beats of SVT.  The episodes seem to occur mostly in the early a.m.  She started going to the PREP program at the [facility].  She notes that her heart rate goes up quickly.  It makes her feel exhausted.  She takes her rest when she needs to and start back exercising.  She has lower extremity edema by the end of the day but no orthopnea or PND.  She did get a new CPAP machine and struggles to use it because of claustrophobia.  However she does use it every night. [facility] continued to have exertional intolerance and chest pain.  She underwent left heart catheterization 09/2020 and her cath revealed 80% distal LAD and 30% LPA B with 50% PL 1 stenoses.  Overall unchanged from prior.  Her blood pressure is very poorly controlled, initially in the low 200s over 110s.  It did improve to 152/78 with hydralazine and verapamil IV.  It was noted that she was extremely anxious and that it was also contributing.  Her angina was thought to be due to microvascular disease and poorly controlled blood pressure.  Her distal LAD lesion was not thought to be favorable for PCI but if her chest pain were intractable could be considered.  At her last appointment she continued to have poorly controlled blood pressures. She was also struggling with anxiety and referred to psychiatry. Doxazosin was increased, and plans were made to refer her to the [employer] BP study at [place], she states she has been feeling okay aside from a cough that she attributes to fluid. She has been taking 40 mg torsemide (2 tablets daily) since 1 week before Thanksgiving. Last week she was taking 2 tablets in the AM, and 1 tablet in the PM. Her weight has increased to 219 lbs, she notes she has never weighed this much. She is feeling abdominal bloating. When she laughs she begins coughing, and it becomes difficult to catch  her breath. She denies being ill recently or being in contact with others who were ill. Also, she is unable to lie flat due to orthopnea, and needs to sit more upright than usual. At home her blood pressure has been averaging 150s-160s. Lately she has had a little bit of exercise. She is trying to walk, and sometimes spends time with her 28 yo grandchild. Currently she does not have a PCP. She denies any palpitations, or chest pain. No lightheadedness, headaches, syncope, PND, lower extremity edema or exertional symptoms.   Past Medical History:  Diagnosis Date   Allergy    Anxiety    Apnea, sleep 05/04/2014  AHI 31/hr now on CPAP at 12cm H2O   Arthritis    Asthma    Asthma without status asthmaticus 01/19/2016   Axillary hidradenitis suppurativa 08/01/2011   Blood transfusion without reported diagnosis    Breast cancer ([place]) 2010   a. locally advanced left breast carcinoma diagnosed in 2010 and treated with neoadjuvant chemotherapy with docetaxel and cyclophosphamide as well as paclitaxel. This was followed by radiation therapy which was completed in 2011.   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  -Med calendar 12/07/12 , 04/06/2013 , 04/18/2014,, 08/30/2014 , 11/08/2014  - PFT's 01/12/2013  1.73 (65%)  Ratio 73 and no better p B2, DLCO 86% - trial off    Depression with somatization 06/15/2015   Dyspnea on exertion    a. 01/2013 Lexi MV: EF 54%, no ischemia/infarct;  b. 05/2013 Echo: EF 60-65%, no  rwma, Gr 2 DD;  b.    Essential hypertension 10/09/2014   Estimated Creatinine Clearance: 82.4 mL/min (by C-G formula based on SCr of 0.98 mg/dL).   Fatty liver    Gastroparesis    GERD (gastroesophageal reflux disease)    Hyperglycemia 04/13/2011   Hypertension    Hypertensive heart disease 09/26/2015   Hypertriglyceridemia without hypercholesterolemia 06/15/2015   Framingham risk score is 1%    Idiopathic intracranial hypertension 10/21/2014   Impaired glucose tolerance 04/13/2011   Insomnia secondary to anxiety 04/05/2015   Lymphadenitis, chronic    restricted LEFT extremity   Malignant neoplasm of upper-outer quadrant of left breast in female, estrogen receptor negative ([place]) 01/07/2013   Stage III, Invasive Ductal, s/p partial Left Mastectomy/Lumpectomy (baseball sized) with Lymph Node dissection, had Chemo and RadRx     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[place]) 09/27/2010   OSA (obstructive sleep apnea) 05/04/2014   AHI 31/hr now on CPAP at 12cm H2O   Oxygen deficiency    Uses 02 in CPAP   Persistent proteinuria 03/31/2018   Personal history of chemotherapy 2010   Personal history of radiation therapy 2010   Sleep apnea    SVT (supraventricular tachycardia) ([place]) 08/22/2020   Tibialis posterior tendinitis, left 07/15/2018   Injected in March 10, 2019   Upper airway cough syndrome 10/09/2014   Onset around 2012 with assoc pseudowheeze MRI 05/18/15 > Paranasal sinuses are clear - 11/17/2015 rec increase h1 to 2 hs to help with noct cough  - Allergy profile 11/17/2015 >  Eos 0.1 /  IgE  80 POS RAST  to grass, ragweed, cedar tree  - d/c acei 03/06/2018  - flare end of Jan 2020 in setting of ? Uri/ poorly controlled GERD - flare mid Oct 2020 with uri / assoc secondary gerd   Vitamin D deficiency disease 03/31/2018    Past Surgical History:  Procedure Laterality Date   BRAIN SURGERY     [place]   BREAST LUMPECTOMY Left 09/2008   CARPAL TUNNEL RELEASE Left 2011   LYMPH NODE DISSECTION Left 2010  breast; 2  wks after breast lumpectomy   RIGHT/LEFT HEART CATH AND CORONARY ANGIOGRAPHY N/A 05/04/2019   Procedure: RIGHT/LEFT HEART CATH AND CORONARY ANGIOGRAPHY;  Surgeon: Jolaine Artist, MD;  Location: [facility];  Service: Cardiovascular;  Laterality: N/A;  Renal injection preformed    RIGHT/LEFT HEART CATH AND CORONARY ANGIOGRAPHY N/A 10/25/2020   Procedure: RIGHT/LEFT HEART CATH AND CORONARY ANGIOGRAPHY;  Surgeon: Leonie Man, MD;  Location: [facility];  Service: Cardiovascular;  Laterality: N/A;   TENDON RELEASE Right 2012   "hand"   TUBAL LIGATION  05/11/2011   Procedure: POST PARTUM TUBAL LIGATION;  Surgeon: Emeterio Reeve, MD;  Location: [facility];  Service: Gynecology;  Laterality: Bilateral;  Induced for HTN    Current Medications: Current Meds  Medication Sig   acetaZOLAMIDE (DIAMOX) 250 MG tablet Take 4 tablets (1,000 mg total) by mouth 3 (three) times daily.   albuterol (PROVENTIL) (2.5 MG/3ML) 0.083% nebulizer solution USE 1 VIAL VIA NEBULIZER EVERY 4 HOURS AND AS NEEDED FOR WHEEZING OR SHORTNESS OF BREATH (Patient taking differently: Take 2.5 mg by nebulization every 6 (six) hours as needed for wheezing or shortness of breath.)   albuterol (VENTOLIN HFA) 108 (90 Base) MCG/ACT inhaler TAKE 2 PUFFS BY MOUTH EVERY 6 HOURS AS NEEDED FOR WHEEZE OR SHORTNESS OF BREATH   AMBULATORY NON FORMULARY MEDICATION Carpal Tunnel Wrist brace. Rt  Carpal Tunnel Syndrome G56.01  Disp 1   AMBULATORY NON FORMULARY MEDICATION Lace up ankle brace (ASO type)  Ankle sprain S93.491A  Disp 1   amitriptyline (ELAVIL) 100 MG tablet TAKE 1 TABLET BY MOUTH EVERYDAY AT BEDTIME (Patient taking differently: Take 100 mg by mouth at bedtime.)   amLODipine (NORVASC) 10 MG tablet Take 1 tablet (10 mg total) by mouth daily.   Biotin 5000 MCG TABS Take 5,000 mg by mouth 2 (two) times daily.    budesonide-formoterol (SYMBICORT) 160-4.5 MCG/ACT inhaler INHALE 2 PUFFS INTO LUNGS 2 TIMES DAILY AS DIRECTED  (Patient taking differently: Inhale 2 puffs into the lungs 2 (two) times daily.)   Cholecalciferol 1.25 MG (50000 UT) capsule Take 1 capsule (50,000 Units total) by mouth once a week. (Patient taking differently: Take 4,000 Units by mouth daily. 2000 mg each  gummies)   cloNIDine (CATAPRES - DOSED IN MG/24 HR) 0.3 mg/24hr patch PLACE 2 PATCHES ONTO SKIN ONCE WEEKLY   clopidogrel (PLAVIX) 75 MG tablet Take 1 tablet (75 mg total) by mouth daily.   clotrimazole (MYCELEX) 10 MG troche Take 1 tablet (10 mg total) by mouth 5 (five) times daily.   dexlansoprazole (DEXILANT) 60 MG capsule Take 1 capsule (60 mg total) by mouth daily.   dextromethorphan-guaiFENesin (MUCINEX DM) 30-600 MG 12hr tablet Take 2 tablets by mouth 2 (two) times daily.   diltiazem (CARDIZEM CD) 240 MG 24 hr capsule Take 1 capsule (240 mg total) by mouth daily.   doxazosin (CARDURA) 8 MG tablet Take 1 tablet (8 mg total) by mouth 2 (two) times daily. (Patient taking differently: Take 8 mg by mouth at bedtime.)   DULoxetine (CYMBALTA) 30 MG capsule TAKE 1 CAPSULE (30 MG TOTAL) BY MOUTH DAILY. WITH 60MG FOR TOTAL OF 90 MG (Patient taking differently: Take 30 mg by mouth See admin instructions. With 79m for total of 90 mg)   DULoxetine (CYMBALTA) 60 MG capsule TAKE 1 CAPSULE (60 MG TOTAL) BY MOUTH DAILY. FOR TOTAL OF 90 MG (Patient taking differently: Take 60 mg by mouth See admin instructions. Take with 30 mg  for a total of 90 mg in the morning)   ENTRESTO 97-103 MG TAKE 1 TABLET BY MOUTH TWICE A DAY   EPINEPHRINE 0.3 mg/0.3 mL IJ SOAJ injection INJECT 0.3 MLS INTO THE MUSCLE ONCE AS NEEDED FOR ANAPHYLAXIS (Patient taking differently: Inject 0.3 mg into the muscle as needed for anaphylaxis.)   ezetimibe (ZETIA) 10 MG tablet Take 1 tablet (10 mg total) by mouth daily.   famotidine (PEPCID) 20 MG tablet Take 1-2 tablets (20-40 mg total) by mouth at bedtime. (Patient taking differently: Take 40 mg by mouth at bedtime.)   gabapentin  (NEURONTIN) 300 MG capsule Take 1 capsule (300 mg total) by mouth 3 (three) times daily. One three times daily (Patient taking differently: Take 300 mg by mouth 3 (three) times daily.)   hydrALAZINE (APRESOLINE) 100 MG tablet Take 1 tablet (100 mg total) by mouth 3 (three) times daily.   hydrochlorothiazide (HYDRODIURIL) 25 MG tablet Take 1 tablet (25 mg total) by mouth daily.   lidocaine-prilocaine (EMLA) cream Apply 1 application topically every 28 (twenty-eight) days. Apply 1 application to skin ever 28 days   LORazepam (ATIVAN) 0.5 MG tablet 1-2 tabs 30 - 60 min prior to MRI. Do not drive with this medicine.   metoCLOPramide (REGLAN) 10 MG tablet TAKE 1 TABLET BY MOUTH 4 TIMES DAILY. (Patient taking differently: Take 10 mg by mouth 4 (four) times daily -  before meals and at bedtime.)   metolazone (ZAROXOLYN) 5 MG tablet TAKE 1 TABLET 1 HOUR PRIOR TO TORSEMIDE AS DIRECTED BY PHYSICIAN   minoxidil (LONITEN) 2.5 MG tablet Take 2 tablets (5 mg total) by mouth 2 (two) times daily.   montelukast (SINGULAIR) 10 MG tablet TAKE 1 TABLET BY MOUTH EVERYDAY AT BEDTIME (Patient taking differently: Take 10 mg by mouth at bedtime.)   nitroGLYCERIN (NITROSTAT) 0.4 MG SL tablet Place 1 tablet (0.4 mg total) under the tongue every 5 (five) minutes as needed for chest pain.   oxymetazoline (MUCINEX SINUS-MAX FULL FORCE) 0.05 % nasal spray Place 1 spray into both nostrils 2 (two) times daily.   PEG-KCl-NaCl-NaSulf-Na Asc-C (PLENVU) 140 g SOLR Take 1 kit by mouth as directed. Manufacturer's coupon Universal coupon code:BIN: P2366821; GROUP: [id number]; PCN: [id number]; ID: [id number]; PAY NO MORE $50; NO prior authorization   primidone (MYSOLINE) 50 MG tablet Take 0.5 tablets (25 mg total) by mouth at bedtime.   promethazine (PHENERGAN) 25 MG suppository Place 25 mg rectally every 6 (six) hours as needed for nausea or vomiting.    promethazine (PHENERGAN) 25 MG tablet TAKE 1 TABLET (25 MG TOTAL) BY MOUTH 3 (THREE) TIMES  DAILY AS NEEDED FOR NAUSEA OR VOMITING.   promethazine-dextromethorphan (PROMETHAZINE-DM) 6.25-15 MG/5ML syrup TAKE 5 MLS BY MOUTH 4 (FOUR) TIMES DAILY AS NEEDED FOR COUGH.   ranolazine (RANEXA) 1000 MG SR tablet TAKE 1 TABLET BY MOUTH TWICE A DAY   Respiratory Therapy Supplies (FLUTTER) DEVI Use as directed   rosuvastatin (CRESTOR) 40 MG tablet Take 1 tablet (40 mg total) by mouth daily.   Spacer/Aero-Holding Chambers (AEROCHAMBER MV) inhaler Use as instructed   spironolactone (ALDACTONE) 100 MG tablet Take 1 tablet (100 mg total) by mouth daily.   [DISCONTINUED] Nebivolol HCl 20 MG TABS Take 40 mg by mouth at bedtime.   [DISCONTINUED] Potassium Chloride ER 20 MEQ TBCR TAKE 1 TABLET BY MOUTH TWICE A DAY (Patient taking differently: Take 20 mEq by mouth daily.)   [DISCONTINUED] torsemide (DEMADEX) 20 MG tablet Take 1 tablet (20 mg total) by mouth  2 (two) times daily.   Current Facility-Administered Medications for the 03/01/21 encounter (Office Visit) with Skeet Latch, MD  Medication   sodium chloride flush (NS) 0.9 % injection 3 mL     Allergies:   Aspirin, Doxycycline, Kiwi extract, Metoprolol, Penicillins, Strawberry extract, and Sulfamethoxazole-trimethoprim   Social History   Socioeconomic History   Marital status: Single    Spouse name: Not on file   Number of children: 3   Years of education: Not on file   Highest education level: Not on file  Occupational History    Employer: UNEMPLOYED  Tobacco Use   Smoking status: Never   Smokeless tobacco: Never  Vaping Use   Vaping Use: Never used  Substance and Sexual Activity   Alcohol use: Yes    Alcohol/week: 0.0 standard drinks    Comment: occasionally/socially   Drug use: No   Sexual activity: Yes    Birth control/protection: Surgical  Other Topics Concern   Not on file  Social History Narrative   She lives with four children.   She is currently not working (disbaility pending).   Left handed   One story home    Drinks caffeine      Social Determinants of Health   Financial Resource Strain: Medium Risk   Difficulty of Paying Living Expenses: Somewhat hard  Food Insecurity: No Food Insecurity   Worried About Charity fundraiser in the Last Year: Never true   Arboriculturist in the Last Year: Never true  Transportation Needs: Not on file  Physical Activity: Not on file  Stress: Stress Concern Present   Feeling of Stress : Rather much  Social Connections: Not on file     Family History: The patient's family history includes Breast cancer in some other family members; CAD in her maternal grandmother; Diabetes in her maternal grandmother; Fibroids (age of onset: 19) in her sister; Heart disease in her maternal grandmother; Hypertension in her maternal grandmother; Other (age of onset: 68) in her mother. There is no history of Cancer, Alcohol abuse, Early death, Hyperlipidemia, Kidney disease, Stroke, Colon cancer, Esophageal cancer, Stomach cancer, Rectal cancer, or Colon polyps.  ROS:   Please see the history of present illness.    (+) Cough (+) Weight gain (+) Abdominal bloating (+) Shortness of breath (+) Orthopnea All other systems reviewed and are negative.  EKGs/Labs/Other Studies Reviewed:    Right/Left Heart Cath 10/25/2020:   Dist LAD lesion is 80% stenosed.   LPAV lesion is 30% stenosed with 50% stenosed side branch in 1st LPL.   There is hyperdynamic left ventricular systolic function.  The left ventricular ejection fraction is greater than 65% by visual estimate.   Hemodynamic findings consistent with mild (secondary) pulmonary hypertension - LVEDP ~25 mmHg, PCWP ~19-20 mmHg   Normal Cardiac Output / Index (7.91 / 3.94)   POST-OPERATIVE DIAGNOSIS:              Stable single-vessel CAD with apical LAD 80% stenosis-no change from previous cath.  Otherwise very tortuous coronary arteries with no significant stenoses.            Mild (likely secondary) Pulmonary Hypertension with  PA mean pressure of 28 mmHg (PAP 39/15 mmHg), and PCWP of 19 mmHg with LVEDP of 25 mmHg.            Significant systolic hypertension with initial pressures in the 200s over 110s, however after medication 152/78 mmHg with MAP of 109 mmHg; LVP/EDP 155/13 mmHg -  25 mmHg.            Ao sat 99%, PA sat 81%.  Fick Cardiac Output-Index: 7.91-3.84 (normal)            Suspect angina is due to accelerated hypertension, increased wall stress and microvascular ischemia.  Distal LAD lesion is not favorable for PCI (however if pain is intractable, could be considered)  Diagnostic Dominance: Right    Monitor 03/23/2020: 3 Day Zio Monitor   Quality: Fair.  Baseline artifact. Predominant rhythm: sinus Average heart rate: 84 bpm Max heart rate: 140 bpm Min heart rate: 57 bpm Pauses >2.5 seconds: none Occasional PVCs and ventricular bigeminy Two runs of SVT up to 8 beats.  Rate 224 bpm.    LHC/RHC 05/04/19: Dist LAD-1 lesion is 30% stenosed. Dist LAD-2 lesion is 80% stenosed. Dist Cx lesion is 60% stenosed.   Findings:   Ao = 173/104 (134) LV =  181/25 RA =  10 RV =  41/13 PA =  42/10 (26) PCW = 18 Fick cardiac output/index = 5.8/2.9 PVR = 1.4 wu Ao sat = 98% PA sat = 73%, 73%   Assessment: 1. 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 LVEF 55-60% 3. Mildly elevated filling pressures with mild pulmonary venous HTN m4. Widely patent bilateral renal arteries. No evidence of AAA 5. Severe systemic HTN   Renal artery Doppler 10/29/18: Right: 1-59% stenosis of the right renal artery. Cyst(s) noted.  Left:  1-59% stenosis of the left renal artery.    Lexiscan Myoview 04/20/18: The left ventricular ejection fraction is moderately decreased (30-44%). Nuclear stress EF: 41%. There was no ST segment deviation noted during stress. This is an intermediate risk study.   Abnormal, intermediate risk stress nuclear study with no ischemia or infarction; EF 41 with global hypokinesis and mild LVE;  study intermediate risk due to reduced LV function.  Echo 03/12/18: Study Conclusions  - Left ventricle: The cavity size was normal. Wall thickness was    increased in a pattern of moderate LVH. Systolic function was    mildly reduced. The estimated ejection fraction was in the range    of 45% to 50%. Wall motion was normal; there were no regional    wall motion abnormalities. Features are consistent with a    pseudonormal left ventricular filling pattern, with concomitant    abnormal relaxation and increased filling pressure (grade 2    diastolic dysfunction).  - Mitral valve: There was mild regurgitation.  - Pulmonary arteries: Systolic pressure was mildly increased. PA    peak pressure: 33 mm Hg (S).   EKG:   03/01/2021: EKG was not ordered. 11/27/2020: EKG was not ordered. 08/22/2020: EKG was not ordered. 06/16/2020: EKG was not ordered.   04/23/19: sinus tachycardia.  Rate 124 bpm.  LVH  Recent Labs: 04/10/2020: Magnesium 2.0 11/20/2020: BNP 22.9 12/07/2020: ALT 33; BUN 14; Creatinine, Ser 1.03; Hemoglobin 13.8; Platelets 218; Potassium 3.9; Sodium 142   Recent Lipid Panel    Component Value Date/Time   CHOL 199 08/22/2020 0912   TRIG 114 08/22/2020 0912   HDL 44 08/22/2020 0912   CHOLHDL 4.5 (H) 08/22/2020 0912   CHOLHDL 4 10/01/2018 1115   VLDL 22.6 10/01/2018 1115   LDLCALC 134 (H) 08/22/2020 0912   LDLDIRECT 152.0 03/30/2018 1604    Physical Exam:    Wt Readings from Last 3 Encounters:  03/01/21 219 lb 14.4 oz (99.7 kg)  12/07/20 212 lb 1.6 oz (96.2 kg)  11/27/20 209 lb  6.4 oz (95 kg)    VS:  BP 140/66 (BP Location: Right Arm, Patient Position: Sitting, Cuff Size: Large)   Pulse 97   Ht '5\' 6"'  (1.676 m)   Wt 219 lb 14.4 oz (99.7 kg)   LMP  (LMP Unknown)   SpO2 (!) 75%   BMI 35.49 kg/m  , BMI Body mass index is 35.49 kg/m. GENERAL:  Well appearing HEENT: Pupils equal round and reactive, fundi not visualized, oral mucosa unremarkable NECK:  No jugular venous  distention, waveform within normal limits, carotid upstroke brisk and symmetric, no bruits LUNGS:  Clear to auscultation bilaterally HEART:  RRR.  PMI not displaced or sustained,S1 and S2 within normal limits, no S3, no S4, no clicks, no rubs, no murmurs ABD:  Non-tender.  positive bowel sounds normal in frequency in pitch, no bruits, no rebound, no guarding, no midline pulsatile mass, no hepatomegaly, no splenomegaly EXT:  2 plus pulses throughout, no edema , no cyanosis no clubbing SKIN:  No rashes no nodules NEURO:  Cranial nerves II through XII grossly intact, motor grossly intact throughout PSYCH:  Cognitively intact, oriented to person place and time   ASSESSMENT:    1. Therapeutic drug monitoring   2. Chronic diastolic CHF (congestive heart failure) ([place])   3. Hypokalemia   4. Accelerated hypertension   5. Acute on chronic diastolic heart failure (HCC)     PLAN:    Acute on chronic diastolic heart failure ([place]) Erica [name] is up over 10 pounds.  She is feeling more short of breath and volume overloaded.  She has tried to increase her torsemide to 40 mg a day.  It was prescribed this 20 mg twice daily but she had previously been taking 20 mg once a day mild accident.  We will continue with 40 mg but increase it to twice a day.  We will give her a dose of metolazone today, tomorrow, and then again Friday if she needs it.  Check a BMP and a BNP today.  Blood pressure is not ideal but much better than where she has been.  Accelerated hypertension BP poorly controlled but better than better than in the past.  Continue current therapies.  Consider renal denervation.  Hyperlipidemia LDL goal <100 Continue rosuvastatin and Zetia.   Secondary Causes of Hypertension  Medications/Herbal: OCP, steroids, stimulants, antidepressants, weight loss medication, immune suppressants, NSAIDs, sympathomimetics, alcohol, caffeine, licorice, ginseng, St. John's wort, chemo Cymbalta  Sleep Apnea:  treated Renal artery stenosis: Mild on ultrasound 09/2018.  Not seen on CT-A 2021 Hyperaldosteronism: normal 04/2019 Hyper/hypothyroidism: TSH normal 04/2019 Pheochromocytoma: (testing not indicated)  Cushing's syndrome: Check cortisol Coarctation of the aorta:    Disposition: FU with APP in 2 weeks. FU with Coltin Casher C. Oval Linsey, MD, [facility] in 2-3 months.  Medication Adjustments/Labs and Tests Ordered: Current medicines are reviewed at length with the patient today.  Concerns regarding medicines are outlined above.   Orders Placed This Encounter  Procedures   Basic metabolic panel   B Nat Peptide   Basic metabolic panel    Meds ordered this encounter  Medications   torsemide 40 MG TABS    Sig: Take 40 mg by mouth 2 (two) times daily.    Dispense:  60 tablet    Refill:  5    NEW DOSE, D/C 20 MG RX   Potassium Chloride ER 20 MEQ TBCR    Sig: Take 2 tablets by mouth 2 (two) times daily.    Dispense:  120  tablet    Refill:  5    NEW DOSE, D/C PREVIOUS RX   metolazone (ZAROXOLYN) 5 MG tablet    Sig: TAKE 1 TABLET 1 HOUR PRIOR TO TORSEMIDE AS DIRECTED BY PHYSICIAN    Dispense:  30 tablet    Refill:  0    I,Mathew Stumpf,acting as a scribe for Skeet Latch, MD.,have documented all relevant documentation on the behalf of Skeet Latch, MD,as directed by  Skeet Latch, MD while in the presence of Skeet Latch, MD.  I, [place] Oval Linsey, MD have reviewed all documentation for this visit.  The documentation of the exam, diagnosis, procedures, and orders on 03/01/2021 are all accurate and complete.  Signed, Skeet Latch, MD  03/01/2021 11:33 AM    [place]

## 2021-03-01 NOTE — Patient Instructions (Signed)
Goserelin injection °What is this medication? °GOSERELIN (GOE se rel in) is similar to a hormone found in the body. It lowers the amount of sex hormones that the body makes. Men will have lower testosterone levels and women will have lower estrogen levels while taking this medicine. In men, this medicine is used to treat prostate cancer; the injection is either given once per month or once every 12 weeks. A once per month injection (only) is used to treat women with endometriosis, dysfunctional uterine bleeding, or advanced breast cancer. °This medicine may be used for other purposes; ask your health care provider or pharmacist if you have questions. °COMMON BRAND NAME(S): Zoladex, Zoladex 3-Month °What should I tell my care team before I take this medication? °They need to know if you have any of these conditions: °bone problems °diabetes °heart disease °history of irregular heartbeat °an unusual or allergic reaction to goserelin, other medicines, foods, dyes, or preservatives °pregnant or trying to get pregnant °breast-feeding °How should I use this medication? °This medicine is for injection under the skin. It is given by a health care professional in a hospital or clinic setting. °Talk to your pediatrician regarding the use of this medicine in children. Special care may be needed. °Overdosage: If you think you have taken too much of this medicine contact a poison control center or emergency room at once. °NOTE: This medicine is only for you. Do not share this medicine with others. °What if I miss a dose? °It is important not to miss your dose. Call your doctor or health care professional if you are unable to keep an appointment. °What may interact with this medication? °Do not take this medicine with any of the following medications: °cisapride °dronedarone °pimozide °thioridazine °This medicine may also interact with the following medications: °other medicines that prolong the QT interval (an abnormal heart  rhythm) °This list may not describe all possible interactions. Give your health care provider a list of all the medicines, herbs, non-prescription drugs, or dietary supplements you use. Also tell them if you smoke, drink alcohol, or use illegal drugs. Some items may interact with your medicine. °What should I watch for while using this medication? °Visit your doctor or health care provider for regular checks on your progress. Your symptoms may appear to get worse during the first weeks of this therapy. Tell your doctor or healthcare provider if your symptoms do not start to get better or if they get worse after this time. °Your bones may get weaker if you take this medicine for a long time. If you smoke or frequently drink alcohol you may increase your risk of bone loss. A family history of osteoporosis, chronic use of drugs for seizures (convulsions), or corticosteroids can also increase your risk of bone loss. Talk to your doctor about how to keep your bones strong. °This medicine should stop regular monthly menstruation in women. Tell your doctor if you continue to menstruate. °Women should not become pregnant while taking this medicine or for 12 weeks after stopping this medicine. Women should inform their doctor if they wish to become pregnant or think they might be pregnant. There is a potential for serious side effects to an unborn child. Talk to your health care professional or pharmacist for more information. Do not breast-feed an infant while taking this medicine. °Men should inform their doctors if they wish to father a child. This medicine may lower sperm counts. Talk to your health care professional or pharmacist for more information. °This   medicine may increase blood sugar. Ask your healthcare provider if changes in diet or medicines are needed if you have diabetes. °What side effects may I notice from receiving this medication? °Side effects that you should report to your doctor or health care  professional as soon as possible: °allergic reactions like skin rash, itching or hives, swelling of the face, lips, or tongue °bone pain °breathing problems °changes in vision °chest pain °feeling faint or lightheaded, falls °fever, chills °pain, swelling, warmth in the leg °pain, tingling, numbness in the hands or feet °signs and symptoms of high blood sugar such as being more thirsty or hungry or having to urinate more than normal. You may also feel very tired or have blurry vision °signs and symptoms of low blood pressure like dizziness; feeling faint or lightheaded, falls; unusually weak or tired °stomach pain °swelling of the ankles, feet, hands °trouble passing urine or change in the amount of urine °unusually high or low blood pressure °unusually weak or tired °Side effects that usually do not require medical attention (report to your doctor or health care professional if they continue or are bothersome): °change in sex drive or performance °changes in breast size in both males and females °changes in emotions or moods °headache °hot flashes °irritation at site where injected °loss of appetite °skin problems like acne, dry skin °vaginal dryness °This list may not describe all possible side effects. Call your doctor for medical advice about side effects. You may report side effects to FDA at 1-800-FDA-1088. °Where should I keep my medication? °This drug is given in a hospital or clinic and will not be stored at home. °NOTE: This sheet is a summary. It may not cover all possible information. If you have questions about this medicine, talk to your doctor, pharmacist, or health care provider. °© 2022 [employer] (2018-07-17 00:00:00) ° °

## 2021-03-01 NOTE — Assessment & Plan Note (Signed)
BP poorly controlled but better than better than in the past.  Continue current therapies.  Consider renal denervation.

## 2021-03-01 NOTE — Progress Notes (Signed)
   Covid-19 Vaccination Clinic  Name:  Erica [name]    MRN: [id number] DOB: 1975/04/22  03/01/2021  [name] was observed post Covid-19 immunization for 15 minutes without incident. She was provided with Vaccine Information Sheet and instruction to access the V-Safe system.   [name] was instructed to call 911 with any severe reactions post vaccine: Difficulty breathing  Swelling of face and throat  A fast heartbeat  A bad rash all over body  Dizziness and weakness   Immunizations Administered     Name Date Dose VIS Date Route   Moderna Covid-19 vaccine Bivalent Booster 03/01/2021 11:07 AM 0.5 mL 11/11/2020 Intramuscular   Manufacturer: Moderna   Lot: [id number]   [place]: [phone]

## 2021-03-01 NOTE — Patient Instructions (Signed)
Medication Instructions:  INCREASE YOUR TORSEMIDE TO 40 MG TWICE A DAY   INCREASE YOUR POTASSIUM TO 40 MEQ TWICE A DAY  TAKE METOLAZONE 5 MG 1 HOUR PRIOR TO TORSEMIDE TONIGHT AND IN THE MORNING  NONE ON Saturday BUT OK TO TAKE 1 Sunday MORNING IF STILL FEELING LIKE HOLDING ON TO FLUID    *If you need a refill on your cardiac medications before your next appointment, please call your pharmacy*  Lab Work: BMET/BNP TODAY   BMET IN 1-2 WEEKS  If you have labs (blood work) drawn today and your tests are completely normal, you will receive your results only by: [place] (if you have MyChart) OR A paper copy in the mail If you have any lab test that is abnormal or we need to change your treatment, we will call you to review the results.  Testing/Procedures: NONE   Follow-Up: At [facility], you and your health needs are our priority.  As part of our continuing mission to provide you with exceptional heart care, we have created designated Provider Care Teams.  These Care Teams include your primary Cardiologist (physician) and Advanced Practice Providers (APPs -  Physician Assistants and Nurse Practitioners) who all work together to provide you with the care you need, when you need it.  We recommend signing up for the patient portal called "MyChart".  Sign up information is provided on this After Visit Summary.  MyChart is used to connect with patients for Virtual Visits (Telemedicine).  Patients are able to view lab/test results, encounter notes, upcoming appointments, etc.  Non-urgent messages can be sent to your provider as well.   To learn more about what you can do with MyChart, go to [email or web address].    Your next appointment:   2 week(s)  The format for your next appointment:   In Person  Provider:   Laurann [place], NP   Your physician recommends that you schedule a follow-up appointment in: 2-3 MONTHS WITH DR [facility]

## 2021-03-01 NOTE — Assessment & Plan Note (Signed)
Erica [name] is up over 10 pounds.  She is feeling more short of breath and volume overloaded.  She has tried to increase her torsemide to 40 mg a day.  It was prescribed this 20 mg twice daily but she had previously been taking 20 mg once a day mild accident.  We will continue with 40 mg but increase it to twice a day.  We will give her a dose of metolazone today, tomorrow, and then again Friday if she needs it.  Check a BMP and a BNP today.  Blood pressure is not ideal but much better than where she has been.

## 2021-03-02 ENCOUNTER — Other Ambulatory Visit (HOSPITAL_BASED_OUTPATIENT_CLINIC_OR_DEPARTMENT_OTHER): Payer: Self-pay | Admitting: *Deleted

## 2021-03-02 LAB — BASIC METABOLIC PANEL
BUN/Creatinine Ratio: 11 (ref 9–23)
BUN: 10 mg/dL (ref 6–24)
CO2: 23 mmol/L (ref 20–29)
Calcium: 9.5 mg/dL (ref 8.7–10.2)
Chloride: 105 mmol/L (ref 96–106)
Creatinine, Ser: 0.88 mg/dL (ref 0.57–1.00)
Glucose: 89 mg/dL (ref 70–99)
Potassium: 4.4 mmol/L (ref 3.5–5.2)
Sodium: 142 mmol/L (ref 134–144)
eGFR: 82 mL/min/{1.73_m2} (ref >59)

## 2021-03-02 LAB — BRAIN NATRIURETIC PEPTIDE: BNP: 41.9 pg/mL (ref 0.0–100.0)

## 2021-03-02 MED ORDER — TORSEMIDE 20 MG PO TABS: 40.0000 mg | ORAL_TABLET | Freq: Two times a day (BID) | ORAL | 3 refills | Status: DC

## 2021-03-16 ENCOUNTER — Other Ambulatory Visit: Payer: Self-pay

## 2021-03-16 ENCOUNTER — Ambulatory Visit (INDEPENDENT_AMBULATORY_CARE_PROVIDER_SITE_OTHER): Payer: Medicare Other | Admitting: Cardiovascular Disease

## 2021-03-16 VITALS — BP 198/94 | HR 82 | Ht 66.0 in | Wt 217.4 lb

## 2021-03-16 DIAGNOSIS — Z79899 Other long term (current) drug therapy: Secondary | ICD-10-CM | POA: Diagnosis not present

## 2021-03-16 DIAGNOSIS — R0600 Dyspnea, unspecified: Secondary | ICD-10-CM

## 2021-03-16 DIAGNOSIS — I25118 Atherosclerotic heart disease of native coronary artery with other forms of angina pectoris: Secondary | ICD-10-CM

## 2021-03-16 DIAGNOSIS — G4733 Obstructive sleep apnea (adult) (pediatric): Secondary | ICD-10-CM

## 2021-03-16 MED ORDER — METOLAZONE 5 MG PO TABS: 5.0000 mg | ORAL_TABLET | ORAL | 3 refills | Status: DC

## 2021-03-16 NOTE — Assessment & Plan Note (Signed)
Blood pressure remains poorly controlled, though better at home than here.  We will check an echo and as long as there is no evidence of any pericardial effusion, then plan to increase minoxidil to 5 mg twice a day.  Otherwise, continue her current regimen.

## 2021-03-16 NOTE — Progress Notes (Signed)
Cardiology Office Note:    Date:  03/19/2021   ID:  Erica [name], DOB May 27, 1975, MRN [id number]  PCP:  Erica Lima, MD  Cardiologist:  Erica Latch, MD  Nephrologist:  Referring MD: Erica Lima, MD   CC: Hypertension  History of Present Illness:    Erica [name] is a 45 y.o. female with a hx of resistant hypertension, medically managed CAD, chronic diastolic heart failure, breast cancer s/p chemo and XRT, morbid obesity and OSA intolerant of CPAP here for follow-up. She initially established care in the hypertension clinic 04/10/2020. She reports that her BP has been elevated since she was treated for breast cancer in 2010.  Since then her blood pressure has never been very well have been controlled.  She has been on multiple antihypertensives and her blood pressure remains in the 740C to 144Y systolic.  She struggles with exertional dyspnea that makes it hard for her to walk even short distances.  Echo 03/2018 revealed LVEF 45 to 50% with grade 2 diastolic dysfunction.  There was mild MR.  PASP was 33.  Erica [name] has a history of recurrent syncope.  She had an episode that occurred while wearing an ambulatory monitor and the episode was felt to be due to narcolepsy.  She has sleep apnea but is not tolerant of her CPAP machine.  She now uses her BIPAP machine but struggles with insomnia.  She wakes up hourly.  She underwent left and right heart catheterization on 05/04/2019.  She was found to have 60% distal left circumflex and 80% distal LAD disease.  Given her aspirin allergy Dr. Meda Coffee recommended that she start clopidogrel.  Right atrial pressure was 10, and PA pressure was 26/10.  PCWP 18.  LVEDP was 23 mmHg.  At her first hypertension clinic visit there were concerns about whether she was taking her medication as prescribed.  Discrepancies between what was ordered and what she was actually picking up from the pharmacy.  She continues to report taking all medications as  prescribed.  Her blood pressures continue to run quite elevated.  At her initial assessment doxazosin was added to her regimen.  This does not seem to have helped much.  She followed up with our pharmacist and was started on amlodipine.  She is unable to exercise due to poorly controlled blood pressure.  She has 1 caffeinated drink daily and does not use over-the-counter supplements or medications.  She continues to try to minimize her sodium intake.  Renal artery Dopplers revealed 1- 59% stenosis bilaterally in 2019.  CT-A of the abdomen showed a 1.3 cm renal cyst but no adrenal adenomas.  There was no evidence of renal artery stenosis.  Erica [name] saw our pharmacist on 12/2019.  She was in a lot of pain at the time.  Given her high number or medications and her pain, her medications were not changed.  She has struggled with LE edema.  LE Doppler was negative for DVT 10/2019.  She was given metolazone to take prior to torsemide.  For the last two months she has been more swollen but it is finally better.  Her weight has decreased from 211 lb to 187 lb.  Erica [name] continues to struggle with shortness of breath. Her edema resolved but there was no improvement in her breathing.  She uses her CPAP every other night.  She notes that there is a recall on her CPAP and she isn't supposed to be using it.  However,  she can't sleep well without it. Lately her BP at home has been in the 150-200s.  She notes that when she has pain it is higher.  She has a lot of joint pain, especially in her feel.    Erica [name] wanted to reduce her medications.  However her blood pressure was significantly elevated.  We reduced hydralazine to 50 mg 3 times a day and increase doxazosin to 8 mg twice daily in an effort to try and get her off of her 3 times daily medications and improve compliance.  Ranexa was increased due to chest pain.  This seems to have helped her chest pain.  It seems to occur randomly both at rest and with exertion.  Her  clonidine was increased to 0.6 mg every 24 hours. She wore an  ambulatory monitor that showed PVCs and up to 8 beats of SVT.  The episodes seem to occur mostly in the early a.m.  She started going to the PREP program at the [facility].  She notes that her heart rate goes up quickly.  It makes her feel exhausted.  She takes her rest when she needs to and start back exercising.  She has lower extremity edema by the end of the day but no orthopnea or PND.  She did get a new CPAP machine and struggles to use it because of claustrophobia.  However she does use it every night. [facility] continued to have exertional intolerance and chest pain.  She underwent left heart catheterization 09/2020 and her cath revealed 80% distal LAD and 30% LPA B with 50% PL 1 stenoses.  Overall unchanged from prior.  Her blood pressure is very poorly controlled, initially in the low 200s over 110s.  It did improve to 152/78 with hydralazine and verapamil IV.  It was noted that she was extremely anxious and that it was also contributing.  Her angina was thought to be due to microvascular disease and poorly controlled blood pressure.  Her distal LAD lesion was not thought to be favorable for PCI but if her chest pain were intractable could be considered.  Erica [name] was struggling with anxiety and referred to psychiatry. Doxazosin was increased, and plans were made to refer her to the Target BP study at [facility]. At the last appointment she was struggling with volume overload and torsemide was increased.  Her weight was up 10 lb.  She was also given metolazone.  BNP at that time was 41.9. Lately her BP has been 150-160s.  She notes improvement in her fluid after taking metolazone.  She had good UOP.  She denies chest pain and her breathing is improving.  She does report cramping.   Past Medical History:  Diagnosis Date   Allergy    Anxiety    Apnea, sleep 05/04/2014   AHI 31/hr now on CPAP at 12cm H2O   Arthritis     Asthma    Asthma without status asthmaticus 01/19/2016   Axillary hidradenitis suppurativa 08/01/2011   Blood transfusion without reported diagnosis    Breast cancer ([place]) 2010   a. locally advanced left breast carcinoma diagnosed in 2010 and treated with neoadjuvant chemotherapy with docetaxel and cyclophosphamide as well as paclitaxel. This was followed by radiation therapy which was completed in 2011.   Chemotherapy-induced peripheral neuropathy ([place]) 12/09/2016   CHF (congestive heart failure) (HCC)    Chronic diastolic CHF (congestive heart failure) ([place]) 06/23/2013   Chronic kidney disease  Chronic pain in left foot 06/29/2018   Chronic stable angina ([place]) 08/22/2020   Cough variant asthma 09/27/2010   Followed in Pulmonary clinic/ [facility]/ [place]  Onset reported in childhood  - PFT's 01/31/2011 FEV1  1.76 (60% ) ratio 68 and no better p B2,  DLCO 70% -med review 09/25/2012 > [place] drug assistance  paperwork given    - HFA 90% 11/20/2012 .  -Med calendar 12/07/12 , 04/06/2013 , 04/18/2014,, 08/30/2014 , 11/08/2014  - PFT's 01/12/2013  1.73 (65%)  Ratio 73 and no better p B2, DLCO 86% - trial off    Depression with somatization 06/15/2015   Dyspnea on exertion    a. 01/2013 Lexi MV: EF 54%, no ischemia/infarct;  b. 05/2013 Echo: EF 60-65%, no rwma, Gr 2 DD;  b.    Essential hypertension 10/09/2014   Estimated Creatinine Clearance: 82.4 mL/min (by C-G formula based on SCr of 0.98 mg/dL).   Fatty liver    Gastroparesis    GERD (gastroesophageal reflux disease)    Hyperglycemia 04/13/2011   Hypertension    Hypertensive heart disease 09/26/2015   Hypertriglyceridemia without hypercholesterolemia 06/15/2015   Framingham risk score is 1%    Idiopathic intracranial hypertension 10/21/2014   Impaired glucose tolerance 04/13/2011   Insomnia secondary to anxiety 04/05/2015   Lymphadenitis, chronic    restricted LEFT extremity   Malignant neoplasm of upper-outer quadrant of left breast in female,  estrogen receptor negative ([place]) 01/07/2013   Stage III, Invasive Ductal, s/p partial Left Mastectomy/Lumpectomy (baseball sized) with Lymph Node dissection, had Chemo and RadRx     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[place]) 09/27/2010   OSA (obstructive sleep apnea) 05/04/2014   AHI 31/hr now on CPAP at 12cm H2O   Oxygen deficiency    Uses 02 in CPAP   Persistent proteinuria 03/31/2018   Personal history of chemotherapy 2010   Personal history of radiation therapy 2010   Sleep apnea    SVT (supraventricular tachycardia) ([place]) 08/22/2020   Tibialis posterior tendinitis, left 07/15/2018   Injected in March 10, 2019   Upper airway cough syndrome 10/09/2014   Onset around 2012 with assoc pseudowheeze MRI 05/18/15 > Paranasal sinuses are clear - 11/17/2015 rec increase h1 to 2 hs to help with noct cough  - Allergy profile 11/17/2015 >  Eos 0.1 /  IgE  80 POS RAST  to grass, ragweed, cedar tree  - d/c acei 03/06/2018  - flare end of Jan 2020 in setting of ? Uri/ poorly controlled GERD - flare mid Oct 2020 with uri / assoc secondary gerd   Vitamin D deficiency disease 03/31/2018    Past Surgical History:  Procedure Laterality Date   BRAIN SURGERY     [place]   BREAST LUMPECTOMY Left 09/2008   CARPAL TUNNEL RELEASE Left 2011   LYMPH NODE DISSECTION Left 2010   breast; 2 wks after breast lumpectomy   RIGHT/LEFT HEART CATH AND CORONARY ANGIOGRAPHY N/A 05/04/2019   Procedure: RIGHT/LEFT HEART CATH AND CORONARY ANGIOGRAPHY;  Surgeon: Jolaine Artist, MD;  Location: [facility];  Service: Cardiovascular;  Laterality: N/A;  Renal injection preformed    RIGHT/LEFT HEART CATH AND CORONARY ANGIOGRAPHY N/A 10/25/2020   Procedure: RIGHT/LEFT HEART CATH AND CORONARY ANGIOGRAPHY;  Surgeon: Leonie Man, MD;  Location: [facility];  Service: Cardiovascular;  Laterality: N/A;   TENDON RELEASE Right 2012   "hand"   TUBAL LIGATION  05/11/2011   Procedure: POST PARTUM TUBAL LIGATION;  Surgeon: Jeneen Rinks  Roselie Awkward, MD;   Location: [facility];  Service: Gynecology;  Laterality: Bilateral;  Induced for HTN    Current Medications: No outpatient medications have been marked as taking for the 03/16/21 encounter (Office Visit) with Erica Latch, MD.   Current Facility-Administered Medications for the 03/16/21 encounter (Office Visit) with Erica Latch, MD  Medication   sodium chloride flush (NS) 0.9 % injection 3 mL     Allergies:   Aspirin, Doxycycline, Kiwi extract, Metoprolol, Penicillins, Strawberry extract, and Sulfamethoxazole-trimethoprim   Social History   Socioeconomic History   Marital status: Single    Spouse name: Not on file   Number of children: 3   Years of education: Not on file   Highest education level: Not on file  Occupational History    Employer: UNEMPLOYED  Tobacco Use   Smoking status: Never   Smokeless tobacco: Never  Vaping Use   Vaping Use: Never used  Substance and Sexual Activity   Alcohol use: Yes    Alcohol/week: 0.0 standard drinks    Comment: occasionally/socially   Drug use: No   Sexual activity: Yes    Birth control/protection: Surgical  Other Topics Concern   Not on file  Social History Narrative   She lives with four children.   She is currently not working (disbaility pending).   Left handed   One story home   Drinks caffeine      Social Determinants of Health   Financial Resource Strain: Not on file  Food Insecurity: No Food Insecurity   Worried About Charity fundraiser in the Last Year: Never true   Arboriculturist in the Last Year: Never true  Transportation Needs: Not on file  Physical Activity: Not on file  Stress: Stress Concern Present   Feeling of Stress : Rather much  Social Connections: Not on file     Family History: The patient's family history includes Breast cancer in some other family members; CAD in her maternal grandmother; Diabetes in her maternal grandmother; Fibroids (age of onset: 72) in her sister; Heart disease in  her maternal grandmother; Hypertension in her maternal grandmother; Other (age of onset: 33) in her mother. There is no history of Cancer, Alcohol abuse, Early death, Hyperlipidemia, Kidney disease, Stroke, Colon cancer, Esophageal cancer, Stomach cancer, Rectal cancer, or Colon polyps.  ROS:   Please see the history of present illness.    (+) Cough (+) Weight gain (+) Abdominal bloating (+) Shortness of breath (+) Orthopnea All other systems reviewed and are negative.  EKGs/Labs/Other Studies Reviewed:    Right/Left Heart Cath 10/25/2020:   Dist LAD lesion is 80% stenosed.   LPAV lesion is 30% stenosed with 50% stenosed side branch in 1st LPL.   There is hyperdynamic left ventricular systolic function.  The left ventricular ejection fraction is greater than 65% by visual estimate.   Hemodynamic findings consistent with mild (secondary) pulmonary hypertension - LVEDP ~25 mmHg, PCWP ~19-20 mmHg   Normal Cardiac Output / Index (7.91 / 3.94)   POST-OPERATIVE DIAGNOSIS:              Stable single-vessel CAD with apical LAD 80% stenosis-no change from previous cath.  Otherwise very tortuous coronary arteries with no significant stenoses.            Mild (likely secondary) Pulmonary Hypertension with PA mean pressure of 28 mmHg (PAP 39/15 mmHg), and PCWP of 19 mmHg with LVEDP of 25 mmHg.  Significant systolic hypertension with initial pressures in the 200s over 110s, however after medication 152/78 mmHg with MAP of 109 mmHg; LVP/EDP 155/13 mmHg - 25 mmHg.            Ao sat 99%, PA sat 81%.  Fick Cardiac Output-Index: 7.91-3.84 (normal)            Suspect angina is due to accelerated hypertension, increased wall stress and microvascular ischemia.  Distal LAD lesion is not favorable for PCI (however if pain is intractable, could be considered)  Diagnostic Dominance: Right    Monitor 03/23/2020: 3 Day Zio Monitor   Quality: Fair.  Baseline artifact. Predominant rhythm:  sinus Average heart rate: 84 bpm Max heart rate: 140 bpm Min heart rate: 57 bpm Pauses >2.5 seconds: none Occasional PVCs and ventricular bigeminy Two runs of SVT up to 8 beats.  Rate 224 bpm.    LHC/RHC 05/04/19: Dist LAD-1 lesion is 30% stenosed. Dist LAD-2 lesion is 80% stenosed. Dist Cx lesion is 60% stenosed.   Findings:   Ao = 173/104 (134) LV =  181/25 RA =  10 RV =  41/13 PA =  42/10 (26) PCW = 18 Fick cardiac output/index = 5.8/2.9 PVR = 1.4 wu Ao sat = 98% PA sat = 73%, 73%   Assessment: 1. 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 LVEF 55-60% 3. Mildly elevated filling pressures with mild pulmonary venous HTN m4. Widely patent bilateral renal arteries. No evidence of AAA 5. Severe systemic HTN   Renal artery Doppler 10/29/18: Right: 1-59% stenosis of the right renal artery. Cyst(s) noted.  Left:  1-59% stenosis of the left renal artery.    Lexiscan Myoview 04/20/18: The left ventricular ejection fraction is moderately decreased (30-44%). Nuclear stress EF: 41%. There was no ST segment deviation noted during stress. This is an intermediate risk study.   Abnormal, intermediate risk stress nuclear study with no ischemia or infarction; EF 41 with global hypokinesis and mild LVE; study intermediate risk due to reduced LV function.  Echo 03/12/18: Study Conclusions  - Left ventricle: The cavity size was normal. Wall thickness was    increased in a pattern of moderate LVH. Systolic function was    mildly reduced. The estimated ejection fraction was in the range    of 45% to 50%. Wall motion was normal; there were no regional    wall motion abnormalities. Features are consistent with a    pseudonormal left ventricular filling pattern, with concomitant    abnormal relaxation and increased filling pressure (grade 2    diastolic dysfunction).  - Mitral valve: There was mild regurgitation.  - Pulmonary arteries: Systolic pressure was mildly increased. PA    peak  pressure: 33 mm Hg (S).   EKG:   03/01/2021: EKG was not ordered. 11/27/2020: EKG was not ordered. 08/22/2020: EKG was not ordered. 06/16/2020: EKG was not ordered.   04/23/19: sinus tachycardia.  Rate 124 bpm.  LVH  Recent Labs: 04/10/2020: Magnesium 2.0 12/07/2020: ALT 33; Hemoglobin 13.8; Platelets 218 03/01/2021: BNP 41.9; BUN 10; Creatinine, Ser 0.88; Potassium 4.4; Sodium 142   Recent Lipid Panel    Component Value Date/Time   CHOL 199 08/22/2020 0912   TRIG 114 08/22/2020 0912   HDL 44 08/22/2020 0912   CHOLHDL 4.5 (H) 08/22/2020 0912   CHOLHDL 4 10/01/2018 1115   VLDL 22.6 10/01/2018 1115   LDLCALC 134 (H) 08/22/2020 0912   LDLDIRECT 152.0 03/30/2018 1604    Physical Exam:  Wt Readings from Last 3 Encounters:  03/19/21 216 lb 12.8 oz (98.3 kg)  03/16/21 217 lb 6.4 oz (98.6 kg)  03/01/21 219 lb 14.4 oz (99.7 kg)    VS:  BP (!) 198/94 (BP Location: Right Arm, Patient Position: Sitting, Cuff Size: Large)    Pulse 82    Ht 5\' 6"  (1.676 m)    Wt 217 lb 6.4 oz (98.6 kg)    LMP  (LMP Unknown)    SpO2 95%    BMI 35.09 kg/m  , BMI Body mass index is 35.09 kg/m. GENERAL:  Well appearing HEENT: Pupils equal round and reactive, fundi not visualized, oral mucosa unremarkable NECK:  No jugular venous distention, waveform within normal limits, carotid upstroke brisk and symmetric, no bruits LUNGS:  Clear to auscultation bilaterally HEART:  RRR.  PMI not displaced or sustained,S1 and S2 within normal limits, no S3, no S4, no clicks, no rubs, no murmurs ABD:  Non-tender.  positive bowel sounds normal in frequency in pitch, no bruits, no rebound, no guarding, no midline pulsatile mass, no hepatomegaly, no splenomegaly EXT:  2 plus pulses throughout, no edema , no cyanosis no clubbing SKIN:  No rashes no nodules NEURO:  Cranial nerves II through XII grossly intact, motor grossly intact throughout PSYCH:  Cognitively intact, oriented to person place and time   ASSESSMENT:    1.  Dyspnea, unspecified type   2. Medication management   3. Coronary artery disease of native artery of native heart with stable angina pectoris ([place])   4. Obstructive sleep apnea syndrome      PLAN:    Malignant hypertension Blood pressure remains poorly controlled, though better at home than here.  We will check an echo and as long as there is no evidence of any pericardial effusion, then plan to increase minoxidil to 5 mg twice a day.  Otherwise, continue her current regimen.  Coronary artery disease Medically managed coronary disease.  80% distal LAD disease but otherwise nonobstructive.  Unchanged from prior cardiac catheterizations.  Continue clopidogrel, nebivolol, and rosuvastatin.  It is that her lipids seem to be getting worse despite rosuvastatin and Zetia.  Consider Inclisiran at follow up, which would also facilitate adherence.Marland Kitchen  Apnea, sleep Continue CPAP    Secondary Causes of Hypertension  Medications/Herbal: OCP, steroids, stimulants, antidepressants, weight loss medication, immune suppressants, NSAIDs, sympathomimetics, alcohol, caffeine, licorice, ginseng, St. John's wort, chemo Cymbalta  Sleep Apnea: treated Renal artery stenosis: Mild on ultrasound 09/2018.  Not seen on CT-A 2021 Hyperaldosteronism: normal 04/2019 Hyper/hypothyroidism: TSH normal 04/2019 Pheochromocytoma: (testing not indicated)  Cushing's syndrome: Check cortisol Coarctation of the aorta:    Disposition: FU with APP in 2 weeks. FU with Patriciann Becht C. Oval Linsey, MD, [facility] in 2-3 months.  Medication Adjustments/Labs and Tests Ordered: Current medicines are reviewed at length with the patient today.  Concerns regarding medicines are outlined above.   Orders Placed This Encounter  Procedures   Basic Metabolic Panel (BMET)   ECHOCARDIOGRAM COMPLETE    Meds ordered this encounter  Medications   metolazone (ZAROXOLYN) 5 MG tablet    Sig: Take 1 tablet (5 mg total) by mouth once a week.     Dispense:  12 tablet    Refill:  3    I,Mathew Stumpf,acting as a scribe for Erica Latch, MD.,have documented all relevant documentation on the behalf of Erica Latch, MD,as directed by  Erica Latch, MD while in the presence of Erica Latch, MD.  I, McLean Oval Linsey, MD  have reviewed all documentation for this visit.  The documentation of the exam, diagnosis, procedures, and orders on 03/19/2021 are all accurate and complete.  Signed, Erica Latch, MD  03/19/2021 6:15 PM    [facility]

## 2021-03-16 NOTE — Assessment & Plan Note (Signed)
Medically managed coronary disease.  80% distal LAD disease but otherwise nonobstructive.  Unchanged from prior cardiac catheterizations.  Continue clopidogrel, nebivolol, and rosuvastatin.  It is that her lipids seem to be getting worse despite rosuvastatin and Zetia.  Consider Inclisiran at follow up, which would also facilitate adherence.Marland Kitchen

## 2021-03-16 NOTE — Patient Instructions (Signed)
Medication Instructions:  TAKE- Metolazone 5 mg by mouth once a week TAKE- Potassium 80 mg twice a day when taking your Metolazone  *If you need a refill on your cardiac medications before your next appointment, please call your pharmacy*   Lab Work: BMP  If you have labs (blood work) drawn today and your tests are completely normal, you will receive your results only by: [place] (if you have MyChart) OR A paper copy in the mail If you have any lab test that is abnormal or we need to change your treatment, we will call you to review the results.   Testing/Procedures: Your physician has requested that you have an echocardiogram. Echocardiography is a painless test that uses sound waves to create images of your heart. It provides your doctor with information about the size and shape of your heart and how well your hearts chambers and valves are working. This procedure takes approximately one hour. There are no restrictions for this procedure.    Follow-Up: At [facility], you and your health needs are our priority.  As part of our continuing mission to provide you with exceptional heart care, we have created designated Provider Care Teams.  These Care Teams include your primary Cardiologist (physician) and Advanced Practice Providers (APPs -  Physician Assistants and Nurse Practitioners) who all work together to provide you with the care you need, when you need it.  We recommend signing up for the patient portal called "MyChart".  Sign up information is provided on this After Visit Summary.  MyChart is used to connect with patients for Virtual Visits (Telemedicine).  Patients are able to view lab/test results, encounter notes, upcoming appointments, etc.  Non-urgent messages can be sent to your provider as well.   To learn more about what you can do with MyChart, go to [email or web address].    Your next appointment:   4 month(s)  The format for your next appointment:   In  Person  Provider:   Skeet Latch, MD    Other Instructions 2 Months with Laurann Montana

## 2021-03-16 NOTE — Assessment & Plan Note (Signed)
Continue CPAP.  

## 2021-03-19 ENCOUNTER — Other Ambulatory Visit: Payer: Self-pay

## 2021-03-19 ENCOUNTER — Encounter (HOSPITAL_BASED_OUTPATIENT_CLINIC_OR_DEPARTMENT_OTHER): Payer: Self-pay | Admitting: Cardiovascular Disease

## 2021-03-19 ENCOUNTER — Encounter: Payer: Self-pay | Admitting: Nurse Practitioner

## 2021-03-19 ENCOUNTER — Ambulatory Visit (INDEPENDENT_AMBULATORY_CARE_PROVIDER_SITE_OTHER): Payer: Medicare Other | Admitting: Nurse Practitioner

## 2021-03-19 VITALS — BP 148/82 | HR 70 | Temp 98.9°F | Ht 65.0 in | Wt 216.8 lb

## 2021-03-19 DIAGNOSIS — J4540 Moderate persistent asthma, uncomplicated: Secondary | ICD-10-CM | POA: Diagnosis not present

## 2021-03-19 DIAGNOSIS — K219 Gastro-esophageal reflux disease without esophagitis: Secondary | ICD-10-CM

## 2021-03-19 DIAGNOSIS — J4530 Mild persistent asthma, uncomplicated: Secondary | ICD-10-CM

## 2021-03-19 DIAGNOSIS — R058 Other specified cough: Secondary | ICD-10-CM | POA: Diagnosis not present

## 2021-03-19 MED ORDER — PREDNISONE 20 MG PO TABS: 20.0000 mg | ORAL_TABLET | Freq: Every day | ORAL | 0 refills | Status: AC

## 2021-03-19 MED ORDER — ALBUTEROL SULFATE (2.5 MG/3ML) 0.083% IN NEBU: 2.5000 mg | INHALATION_SOLUTION | Freq: Four times a day (QID) | RESPIRATORY_TRACT | 1 refills | Status: DC | PRN

## 2021-03-19 NOTE — Assessment & Plan Note (Addendum)
Breathing returned to baseline after diuresis. Previously well-controlled on Symbicort and PRN albuterol. Moving air well upon auscultation. Will do low dose prednisone 20 mg for 5 days given recent SOB (likely related to fluid overload) and rhonchi in RML on exam. Advised to notify if SOB, wheezing, worsening cough, or change in sputum occurs for further eval. Echocardiogram today - follow up with cardiology as ordered.  Patient Instructions  -Continue Symbicort two puffs, Twice daily. Brush tongue and rinse mouth afterwards -Continue albuterol 2 puffs or 3 mL nebulizer every 6 hours as needed for shortness of breath or wheezing. Continue to use albuterol inhaler 2 puffs 15 min prior to exercise.  -Continue Mucinex DM Twice daily as needed for cough and congestion -Continue Pepcid 20 mg 1-2 tabs At bedtime  -Continue Singulair 10 mg At bedtime  -Continue oxymetazoline nasal spray 1 spray each nostril Twice daily   -Prednisone 20 mg for 5 days. Take in AM with food.   Asthma Action Plan in place Rinse mouth after inhaled corticosteroid use.  Avoid triggers, when able.  Exercise encouraged. Notify if worsening symptoms upon exertion.  Notify and seek help if symptoms unrelieved by rescue inhaler.   Follow up with cardiology as scheduled.   Follow up with Dr. Melvyn Novas, Roxan Diesel, NP or APP in 3 months. If symptoms do not improve or worsen, please contact office for sooner follow up or seek emergency care.

## 2021-03-19 NOTE — Progress Notes (Signed)
'@Patient'  ID: Erica [name], female    DOB: 1976-03-20, 45 y.o.   MRN: [id number]  Chief Complaint  Patient presents with   Acute Visit    Heart docotr says unable to hear a lot of air flow in lungs. Appt with heart doctor was last week. Pt states she does feel SOB. Did have fluid noted around lungs 2 weeks ago.     Referring provider: [name], MD  HPI: 45 year old female, never smoker followed for asthma and upper airway cough syndrome. She is a patient of Dr. Gustavus Bryant and was last seen in office 11/03/2019. Past medical history significant for hypertension, diastolic CHF, GERD, breast cancer, obesity, OSA (managed by cardiology).  TEST/EVENTS:  01/31/2011 PFTs: FVC 2.63 (70%), FEV1 1.95 (67), ratio 74, TLC 3.96 (75), RV 1.36 (79), ERV 0.78 (64), DLCO uncorrected 19.1 (70).  Mild obstructive lung defect, mild restrictive lung defect.  Mild decrease in diffusing capacity.  Significant response to BD 04/04/2015 methacholine challenge: Moderate obstructive airways disease.  Positive for hyperreactive airways at 6.56 CDUs, FEV1 declined by more than 20% 10/01/2018 6 min walk: SpO2 low 92%; HR 142 bpm at end 06/06/2019 BiPAP titration: SPO2 low 87%, AHI 17.1, total RDI 22.4.  BiPAP IPAP 17 EPAP 13 flex 3, small facemask 10/25/2020 cardiac cath: Distal LAD lesion is 80% stenosis.  L PLV lesion is 30% stenosed with 50% stenosed side branch and first LPL.  Hyperdynamic LV systolic function.  LVEF >65%.  Mild pulmonary hypertension (PA mean pressure 28 mmHg, PCWP approx. 19-20 mmHg), normal CO/CI.  Significant systolic hypertension with initial pressures in the 200s.  Suspect angina is due to accelerated hypertension, increased wall stress and microvascular ischemia.  11/03/2019: OV with Dr. Melvyn Novas.  Breathing stable.  Persistent cough.  Trialed gabapentin 4 times daily and Phenergan DM as needed for cough.  Continue Symbicort Twice daily and albuterol as needed.   03/19/2021: Today - acute visit Patient  presents today after seeing cardiologist Friday who recommended she be seen by pulmonology.  She reports that her cardiologist stated she did not have a lot of airflow when she listened to her.  She was recently treated for pulmonary edema by her cardiologist and was seen on Friday for follow-up for this.  Today, she denies any worsening shortness of breath, increased cough or increased sputum production.  Her cough remains productive with white sputum, which is no change from baseline. She reports feeling much better since increasing her diuretics. She denies orthopnea, PND, chest pain, wheezing or lower extremity swelling. She denies any worsening nasal congestion, rhinorrhea, or recent sick exposures. She continues on her Symbicort Twice daily and rarely requires her rescue inhaler. She does use the albuterol inhaler 15 min before exercise. She has an echocardiogram ordered today. Overall, she feels well and offers no further complaints.   Allergies  Allergen Reactions   Aspirin Shortness Of Breath and Palpitations    Pt can take ibuprofen without reaction   Doxycycline Anaphylaxis   Kiwi Extract Itching and Swelling    Lip swelling   Metoprolol Hives and Itching   Penicillins Shortness Of Breath and Palpitations    Has patient had a PCN reaction causing immediate rash, facial/tongue/throat swelling, SOB or lightheadedness with hypotension: Yes Has patient had a PCN reaction causing severe rash involving mucus membranes or skin necrosis: Yes Has patient had a PCN reaction that required hospitalization Yes Has patient had a PCN reaction occurring within the last 10 years: No  If all of the above answers are "NO", then may proceed with Cephalosporin use.   Strawberry Extract Hives   Sulfamethoxazole-Trimethoprim Anaphylaxis    Facial, throat and tongue swelling with difficulty swallowing.    Immunization History  Administered Date(s) Administered   Influenza Split 12/31/2010, 03/02/2012,  12/01/2015   Influenza,inj,Quad PF,6+ Mos 02/09/2013, 02/02/2014, 12/06/2014, 12/05/2015, 02/04/2017, 03/06/2018, 12/04/2018   Influenza-Unspecified 01/10/2021   Moderna Covid-19 Vaccine Bivalent Booster 58yr & up 03/01/2021   Pneumococcal Conjugate-13 12/01/2014   Pneumococcal Polysaccharide-23 03/02/2012, 12/07/2020   Tdap 05/12/2011    Past Medical History:  Diagnosis Date   Allergy    Anxiety    Apnea, sleep 05/04/2014   AHI 31/hr now on CPAP at 12cm H2O   Arthritis    Asthma    Asthma without status asthmaticus 01/19/2016   Axillary hidradenitis suppurativa 08/01/2011   Blood transfusion without reported diagnosis    Breast cancer ([facility] 2010   a. locally advanced left breast carcinoma diagnosed in 2010 and treated with neoadjuvant chemotherapy with docetaxel and cyclophosphamide as well as paclitaxel. This was followed by radiation therapy which was completed in 2011.   Chemotherapy-induced peripheral neuropathy ([facility] 12/09/2016   CHF (congestive heart failure) (HCC)    Chronic diastolic CHF (congestive heart failure) ([facility] 06/23/2013   Chronic kidney disease    Chronic pain in left foot 06/29/2018   Chronic stable angina ([facility] 08/22/2020   Cough variant asthma 09/27/2010   Followed in Pulmonary clinic/ [facility]/ [place]  Onset reported in childhood  - PFT's 01/31/2011 FEV1  1.76 (60% ) ratio 68 and no better p B2,  DLCO 70% -med review 09/25/2012 > [place] drug assistance  paperwork given    - HFA 90% 11/20/2012 .  -Med calendar 12/07/12 , 04/06/2013 , 04/18/2014,, 08/30/2014 , 11/08/2014  - PFT's 01/12/2013  1.73 (65%)  Ratio 73 and no better p B2, DLCO 86% - trial off    Depression with somatization 06/15/2015   Dyspnea on exertion    a. 01/2013 Lexi MV: EF 54%, no ischemia/infarct;  b. 05/2013 Echo: EF 60-65%, no rwma, Gr 2 DD;  b.    Essential hypertension 10/09/2014   Estimated Creatinine Clearance: 82.4 mL/min (by C-G formula based on SCr of 0.98 mg/dL).   Fatty liver    Gastroparesis     GERD (gastroesophageal reflux disease)    Hyperglycemia 04/13/2011   Hypertension    Hypertensive heart disease 09/26/2015   Hypertriglyceridemia without hypercholesterolemia 06/15/2015   Framingham risk score is 1%    Idiopathic intracranial hypertension 10/21/2014   Impaired glucose tolerance 04/13/2011   Insomnia secondary to anxiety 04/05/2015   Lymphadenitis, chronic    restricted LEFT extremity   Malignant neoplasm of upper-outer quadrant of left breast in female, estrogen receptor negative ([facility] [facility] 01/07/2013   Stage III, Invasive Ductal, s/p partial Left Mastectomy/Lumpectomy (baseball sized) with Lymph Node dissection, had Chemo and RadRx     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[facility] 09/27/2010   OSA (obstructive sleep apnea) 05/04/2014   AHI 31/hr now on CPAP at 12cm H2O   Oxygen deficiency    Uses 02 in CPAP   Persistent proteinuria 03/31/2018   Personal history of chemotherapy 2010   Personal history of radiation therapy 2010   Sleep apnea    SVT (supraventricular tachycardia) ([facility] 08/22/2020   Tibialis posterior tendinitis, left 07/15/2018   Injected in March 10, 2019   Upper airway cough syndrome 10/09/2014   Onset around 2012 with assoc pseudowheeze MRI 05/18/15 >  Paranasal sinuses are clear - 11/17/2015 rec increase h1 to 2 hs to help with noct cough  - Allergy profile 11/17/2015 >  Eos 0.1 /  IgE  80 POS RAST  to grass, ragweed, cedar tree  - d/c acei 03/06/2018  - flare end of Jan 2020 in setting of ? Uri/ poorly controlled GERD - flare mid Oct 2020 with uri / assoc secondary gerd   Vitamin D deficiency disease 03/31/2018    Tobacco History: Social History   Tobacco Use  Smoking Status Never  Smokeless Tobacco Never   Counseling given: Not Answered   Outpatient Medications Prior to Visit  Medication Sig Dispense Refill   acetaZOLAMIDE (DIAMOX) 250 MG tablet Take 4 tablets (1,000 mg total) by mouth 3 (three) times daily. 1080 tablet 3   albuterol (VENTOLIN HFA) 108 (90 Base) MCG/ACT  inhaler TAKE 2 PUFFS BY MOUTH EVERY 6 HOURS AS NEEDED FOR WHEEZE OR SHORTNESS OF BREATH 8.5 each 1   AMBULATORY NON FORMULARY MEDICATION Carpal Tunnel Wrist brace. Rt  Carpal Tunnel Syndrome G56.01  Disp 1 1 each 0   AMBULATORY NON FORMULARY MEDICATION Lace up ankle brace (ASO type)  Ankle sprain S93.491A  Disp 1 1 each 0   amitriptyline (ELAVIL) 100 MG tablet TAKE 1 TABLET BY MOUTH EVERYDAY AT BEDTIME (Patient taking differently: Take 100 mg by mouth at bedtime.) 90 tablet 0   Biotin 5000 MCG TABS Take 5,000 mg by mouth 2 (two) times daily.      budesonide-formoterol (SYMBICORT) 160-4.5 MCG/ACT inhaler INHALE 2 PUFFS INTO LUNGS 2 TIMES DAILY AS DIRECTED (Patient taking differently: Inhale 2 puffs into the lungs 2 (two) times daily.) 10.2 g 11   Cholecalciferol 1.25 MG (50000 UT) capsule Take 1 capsule (50,000 Units total) by mouth once a week. (Patient taking differently: Take 4,000 Units by mouth daily. 2000 mg each  gummies) 12 capsule 1   cloNIDine (CATAPRES - DOSED IN MG/24 HR) 0.3 mg/24hr patch PLACE 2 PATCHES ONTO SKIN ONCE WEEKLY 24 patch 1   clopidogrel (PLAVIX) 75 MG tablet Take 1 tablet (75 mg total) by mouth daily. 90 tablet 3   clotrimazole (MYCELEX) 10 MG troche Take 1 tablet (10 mg total) by mouth 5 (five) times daily. 30 tablet 11   COVID-19 mRNA bivalent vaccine, Moderna, (MODERNA COVID-19 BIVAL BOOSTER) 50 MCG/0.5ML injection Inject into the muscle. 0.5 mL 0   dexlansoprazole (DEXILANT) 60 MG capsule Take 1 capsule (60 mg total) by mouth daily. 90 capsule 2   dextromethorphan-guaiFENesin (MUCINEX DM) 30-600 MG 12hr tablet Take 2 tablets by mouth 2 (two) times daily.     doxazosin (CARDURA) 8 MG tablet Take 1 tablet (8 mg total) by mouth 2 (two) times daily. (Patient taking differently: Take 8 mg by mouth at bedtime.) 180 tablet 3   DULoxetine (CYMBALTA) 30 MG capsule TAKE 1 CAPSULE (30 MG TOTAL) BY MOUTH DAILY. WITH 60MG FOR TOTAL OF 90 MG (Patient taking differently: Take  30 mg by mouth See admin instructions. With 64m for total of 90 mg) 30 capsule 3   DULoxetine (CYMBALTA) 60 MG capsule TAKE 1 CAPSULE (60 MG TOTAL) BY MOUTH DAILY. FOR TOTAL OF 90 MG (Patient taking differently: Take 60 mg by mouth See admin instructions. Take with 30 mg for a total of 90 mg in the morning) 30 capsule 3   ENTRESTO 97-103 MG TAKE 1 TABLET BY MOUTH TWICE A DAY 60 tablet 5   EPINEPHRINE 0.3 mg/0.3 mL IJ SOAJ injection INJECT 0.3  MLS INTO THE MUSCLE ONCE AS NEEDED FOR ANAPHYLAXIS (Patient taking differently: Inject 0.3 mg into the muscle as needed for anaphylaxis.) 2 Device 2   ezetimibe (ZETIA) 10 MG tablet Take 1 tablet (10 mg total) by mouth daily. 90 tablet 3   famotidine (PEPCID) 20 MG tablet Take 1-2 tablets (20-40 mg total) by mouth at bedtime. (Patient taking differently: Take 40 mg by mouth at bedtime.) 180 tablet 2   gabapentin (NEURONTIN) 300 MG capsule Take 1 capsule (300 mg total) by mouth 3 (three) times daily. One three times daily (Patient taking differently: Take 300 mg by mouth 3 (three) times daily.) 90 capsule 2   lidocaine-prilocaine (EMLA) cream Apply 1 application topically every 28 (twenty-eight) days. Apply 1 application to skin ever 28 days     LORazepam (ATIVAN) 0.5 MG tablet 1-2 tabs 30 - 60 min prior to MRI. Do not drive with this medicine. 4 tablet 0   metoCLOPramide (REGLAN) 10 MG tablet TAKE 1 TABLET BY MOUTH 4 TIMES DAILY. (Patient taking differently: Take 10 mg by mouth 4 (four) times daily -  before meals and at bedtime.) 360 tablet 0   metolazone (ZAROXOLYN) 5 MG tablet Take 1 tablet (5 mg total) by mouth once a week. 12 tablet 3   minoxidil (LONITEN) 2.5 MG tablet Take 2 tablets (5 mg total) by mouth 2 (two) times daily. 120 tablet 3   montelukast (SINGULAIR) 10 MG tablet TAKE 1 TABLET BY MOUTH EVERYDAY AT BEDTIME (Patient taking differently: Take 10 mg by mouth at bedtime.) 90 tablet 3   Nebivolol HCl 20 MG TABS TAKE 2 TABLETS BY MOUTH EVERY DAY 180  tablet 3   nitroGLYCERIN (NITROSTAT) 0.4 MG SL tablet Place 1 tablet (0.4 mg total) under the tongue every 5 (five) minutes as needed for chest pain. 25 tablet 2   oxymetazoline (MUCINEX SINUS-MAX FULL FORCE) 0.05 % nasal spray Place 1 spray into both nostrils 2 (two) times daily.     PEG-KCl-NaCl-NaSulf-Na Asc-C (PLENVU) 140 g SOLR Take 1 kit by mouth as directed. Manufacturer's coupon Universal coupon code:BIN: P2366821; GROUP: [id number]; PCN: [id number]; ID: [id number]; PAY NO MORE $50; NO prior authorization 1 each 0   Potassium Chloride ER 20 MEQ TBCR Take 2 tablets by mouth 2 (two) times daily. 120 tablet 5   primidone (MYSOLINE) 50 MG tablet Take 0.5 tablets (25 mg total) by mouth at bedtime. 45 tablet 3   promethazine (PHENERGAN) 25 MG suppository Place 25 mg rectally every 6 (six) hours as needed for nausea or vomiting.      promethazine (PHENERGAN) 25 MG tablet TAKE 1 TABLET (25 MG TOTAL) BY MOUTH 3 (THREE) TIMES DAILY AS NEEDED FOR NAUSEA OR VOMITING. 270 tablet 1   promethazine-dextromethorphan (PROMETHAZINE-DM) 6.25-15 MG/5ML syrup TAKE 5 MLS BY MOUTH 4 (FOUR) TIMES DAILY AS NEEDED FOR COUGH. 473 mL 2   ranolazine (RANEXA) 1000 MG SR tablet TAKE 1 TABLET BY MOUTH TWICE A DAY 180 tablet 3   Respiratory Therapy Supplies (FLUTTER) DEVI Use as directed 1 each 0   rosuvastatin (CRESTOR) 40 MG tablet Take 1 tablet (40 mg total) by mouth daily. 90 tablet 3   Spacer/Aero-Holding Chambers (AEROCHAMBER MV) inhaler Use as instructed 1 each 0   spironolactone (ALDACTONE) 100 MG tablet Take 1 tablet (100 mg total) by mouth daily. 90 tablet 3   torsemide (DEMADEX) 20 MG tablet Take 2 tablets (40 mg total) by mouth 2 (two) times daily. 360 tablet 3   albuterol (PROVENTIL) (2.5  MG/3ML) 0.083% nebulizer solution USE 1 VIAL VIA NEBULIZER EVERY 4 HOURS AND AS NEEDED FOR WHEEZING OR SHORTNESS OF BREATH (Patient taking differently: Take 2.5 mg by nebulization every 6 (six) hours as needed for wheezing or shortness  of breath.) 75 mL 1   amLODipine (NORVASC) 10 MG tablet Take 1 tablet (10 mg total) by mouth daily. 180 tablet 3   diltiazem (CARDIZEM CD) 240 MG 24 hr capsule Take 1 capsule (240 mg total) by mouth daily. 90 capsule 3   hydrALAZINE (APRESOLINE) 100 MG tablet Take 1 tablet (100 mg total) by mouth 3 (three) times daily. 270 tablet 3   hydrochlorothiazide (HYDRODIURIL) 25 MG tablet Take 1 tablet (25 mg total) by mouth daily. 90 tablet 3   Facility-Administered Medications Prior to Visit  Medication Dose Route Frequency Provider Last Rate Last Admin   goserelin (ZOLADEX) injection 3.6 mg  3.6 mg Subcutaneous Q28 days Magrinat, Virgie Dad, MD   3.6 mg at 06/01/20 1009   sodium chloride flush (NS) 0.9 % injection 3 mL  3 mL Intravenous Q12H Loel Dubonnet, NP         Review of Systems:   Constitutional: No weight loss or gain, night sweats, fevers, chills, fatigue, or lassitude. HEENT: No headaches, difficulty swallowing, tooth/dental problems, or sore throat. No sneezing, itching, ear ache, nasal congestion, or post nasal drip CV:  No chest pain, orthopnea, PND, swelling in lower extremities, anasarca, dizziness, palpitations, syncope Resp: +shortness of breath with exertion (unchanged from baseline; relief with predosing of albuterol); chronic productive cough (no change from baseline). No excess mucus or change in color of mucus. No hemoptysis. No wheezing.  No chest wall deformity GI:  No heartburn, indigestion, abdominal pain, nausea, vomiting, diarrhea, change in bowel habits, loss of appetite, bloody stools.  GU: No dysuria, change in color of urine, urgency or frequency.  No flank pain, no hematuria  Skin: No rash, lesions, ulcerations MSK:  No joint pain or swelling.  No decreased range of motion.  No back pain. Neuro: No dizziness or lightheadedness.  Psych: No depression or anxiety. Mood stable.     Physical Exam:  BP (!) 148/82 (BP Location: Left Arm, Patient Position: Sitting,  Cuff Size: Normal)    Pulse 70    Temp 98.9 F (37.2 C) (Oral)    Ht '5\' 5"'  (1.651 m)    Wt 216 lb 12.8 oz (98.3 kg)    LMP  (LMP Unknown)    SpO2 99%    BMI 36.08 kg/m   GEN: Pleasant, interactive; obese; in no acute distress. HEENT:  Normocephalic and atraumatic. EACs patent bilaterally. TM pearly gray with present light reflex bilaterally. PERRLA. Sclera white. Nasal turbinates pink, moist and patent bilaterally. No rhinorrhea present. Oropharynx pink and moist, without exudate or edema. No lesions, ulcerations, or postnasal drip.  NECK:  Supple w/ fair ROM. No JVD present. Normal carotid impulses w/o bruits. Thyroid symmetrical with no goiter or nodules palpated. No lymphadenopathy.   CV: RRR, no m/r/g, no peripheral edema. Pulses intact, +2 bilaterally. No cyanosis, pallor or clubbing. PULMONARY:  Unlabored, regular breathing. Rhonchi left middle lobe, otherwise clear bilaterally A&P w/o wheezes. No accessory muscle use. No dullness to percussion. GI: BS present and normoactive. Soft, non-tender to palpation. No organomegaly or masses detected. No CVA tenderness. MSK: No erythema, warmth or tenderness. Cap refil <2 sec all extrem. No deformities or joint swelling noted.  Neuro: A/Ox3. No focal deficits noted.   Skin: Warm, no lesions  or rashe Psych: Normal affect and behavior. Judgement and thought content appropriate.     Lab Results:  CBC    Component Value Date/Time   WBC 6.3 12/07/2020 0808   RBC 4.76 12/07/2020 0808   HGB 13.8 12/07/2020 0808   HGB 13.8 10/24/2020 0911   HGB 13.8 03/17/2017 0835   HCT 42.1 12/07/2020 0808   HCT 42.5 10/24/2020 0911   HCT 41.8 03/17/2017 0835   PLT 218 12/07/2020 0808   PLT 218 10/24/2020 0911   MCV 88.4 12/07/2020 0808   MCV 89 10/24/2020 0911   MCV 88.0 03/17/2017 0835   MCH 29.0 12/07/2020 0808   MCHC 32.8 12/07/2020 0808   RDW 14.0 12/07/2020 0808   RDW 13.7 10/24/2020 0911   RDW 14.5 03/17/2017 0835   LYMPHSABS 2.5 12/07/2020 0808    LYMPHSABS 2.3 02/28/2020 0841   LYMPHSABS 1.6 03/17/2017 0835   MONOABS 0.4 12/07/2020 0808   MONOABS 0.3 03/17/2017 0835   EOSABS 0.1 12/07/2020 0808   EOSABS 0.2 02/28/2020 0841   BASOSABS 0.0 12/07/2020 0808   BASOSABS 0.0 02/28/2020 0841   BASOSABS 0.1 03/17/2017 0835    BMET    Component Value Date/Time   NA 142 03/01/2021 1048   NA 142 03/17/2017 0835   K 4.4 03/01/2021 1048   K 3.6 03/17/2017 0835   CL 105 03/01/2021 1048   CL 103 09/10/2012 1618   CO2 23 03/01/2021 1048   CO2 27 03/17/2017 0835   GLUCOSE 89 03/01/2021 1048   GLUCOSE 104 (H) 12/07/2020 0808   GLUCOSE 114 03/17/2017 0835   GLUCOSE 86 09/10/2012 1618   BUN 10 03/01/2021 1048   BUN 10.1 03/17/2017 0835   CREATININE 0.88 03/01/2021 1048   CREATININE 1.27 (H) 12/09/2018 1222   CREATININE 0.9 03/17/2017 0835   CALCIUM 9.5 03/01/2021 1048   CALCIUM 9.5 03/17/2017 0835   GFRNONAA >60 12/07/2020 0808   GFRNONAA 51 (L) 12/09/2018 1222   GFRAA 72 04/10/2020 0942   GFRAA 59 (L) 12/09/2018 1222    BNP    Component Value Date/Time   BNP 41.9 03/01/2021 1048     Imaging:  No results found.  goserelin (ZOLADEX) injection 3.6 mg     Date Action Dose Route User   02/01/2021 0817 Given 3.6 mg Subcutaneous (Left Lower Abdomen) Elizebeth Brooking, LPN      goserelin (ZOLADEX) injection 3.6 mg     Date Action Dose Route User   03/01/2021 0800 Given 3.6 mg Subcutaneous (Left Lower Abdomen) Laurence Aly, LPN       PFT Results Latest Ref Rng & Units 04/04/2015  FVC-Pre L 2.24  FVC-Predicted Pre % 70  FVC-Post L 2.26  FVC-Predicted Post % 71  Pre FEV1/FVC % % 76  Post FEV1/FCV % % 78  FEV1-Pre L 1.71  FEV1-Predicted Pre % 65  FEV1-Post L 1.77    Lab Results  Component Value Date   NITRICOXIDE 8 11/17/2015        Assessment & Plan:   Asthma in adult, mild persistent, uncomplicated Breathing returned to baseline after diuresis. Previously well-controlled on Symbicort and PRN  albuterol. Moving air well upon auscultation. Will do low dose prednisone 20 mg for 5 days given recent SOB (likely related to fluid overload) and rhonchi in RML on exam. Advised to notify if SOB, wheezing, worsening cough, or change in sputum occurs for further eval. Echocardiogram today - follow up with cardiology as ordered.  Patient Instructions  -Continue Symbicort two  puffs, Twice daily. Brush tongue and rinse mouth afterwards -Continue albuterol 2 puffs or 3 mL nebulizer every 6 hours as needed for shortness of breath or wheezing. Continue to use albuterol inhaler 2 puffs 15 min prior to exercise.  -Continue Mucinex DM Twice daily as needed for cough and congestion -Continue Pepcid 20 mg 1-2 tabs At bedtime  -Continue Singulair 10 mg At bedtime  -Continue oxymetazoline nasal spray 1 spray each nostril Twice daily   -Prednisone 20 mg for 5 days. Take in AM with food.   Asthma Action Plan in place Rinse mouth after inhaled corticosteroid use.  Avoid triggers, when able.  Exercise encouraged. Notify if worsening symptoms upon exertion.  Notify and seek help if symptoms unrelieved by rescue inhaler.   Follow up with cardiology as scheduled.   Follow up with Dr. Melvyn Novas, Roxan Diesel, NP or APP in 3 months. If symptoms do not improve or worsen, please contact office for sooner follow up or seek emergency care.    GERD (gastroesophageal reflux disease) Stable. See above plan.  Upper airway cough syndrome Unchanged from baseline. See above plan.    Clayton Bibles, NP 03/19/2021  Pt aware and understands NP's role.

## 2021-03-19 NOTE — Patient Instructions (Signed)
-  Continue Symbicort two puffs, Twice daily. Brush tongue and rinse mouth afterwards -Continue albuterol 2 puffs or 3 mL nebulizer every 6 hours as needed for shortness of breath or wheezing. Continue to use albuterol inhaler 2 puffs 15 min prior to exercise.  -Continue Mucinex DM Twice daily as needed for cough and congestion -Continue Pepcid 20 mg 1-2 tabs At bedtime  -Continue Singulair 10 mg At bedtime  -Continue oxymetazoline nasal spray 1 spray each nostril Twice daily   -Prednisone 20 mg for 5 days. Take in AM with food.   Asthma Action Plan in place Rinse mouth after inhaled corticosteroid use.  Avoid triggers, when able.  Exercise encouraged. Notify if worsening symptoms upon exertion.  Notify and seek help if symptoms unrelieved by rescue inhaler.   Follow up with cardiology as scheduled.   Follow up with Dr. Melvyn Novas, Roxan Diesel, NP or APP in 3 months. If symptoms do not improve or worsen, please contact office for sooner follow up or seek emergency care.

## 2021-03-19 NOTE — Assessment & Plan Note (Signed)
Unchanged from baseline. See above plan.

## 2021-03-19 NOTE — Assessment & Plan Note (Signed)
Stable. See above plan.

## 2021-03-21 ENCOUNTER — Ambulatory Visit: Payer: Medicare Other | Admitting: Psychologist

## 2021-03-29 ENCOUNTER — Other Ambulatory Visit: Payer: Self-pay

## 2021-03-29 ENCOUNTER — Inpatient Hospital Stay: Payer: Medicare Other

## 2021-03-29 VITALS — BP 190/90 | HR 92 | Temp 98.7°F | Resp 20

## 2021-03-29 DIAGNOSIS — C50412 Malignant neoplasm of upper-outer quadrant of left female breast: Secondary | ICD-10-CM

## 2021-03-29 MED ORDER — GOSERELIN ACETATE 3.6 MG ~~LOC~~ IMPL
3.6000 mg | DRUG_IMPLANT | Freq: Once | SUBCUTANEOUS | Status: AC
  Administered 2021-03-29: 09:00:00 3.6 mg via SUBCUTANEOUS
  Filled 2021-03-29: qty 3.6

## 2021-04-02 ENCOUNTER — Other Ambulatory Visit: Payer: Self-pay | Admitting: Internal Medicine

## 2021-04-05 ENCOUNTER — Ambulatory Visit (INDEPENDENT_AMBULATORY_CARE_PROVIDER_SITE_OTHER): Payer: Medicare Other

## 2021-04-05 ENCOUNTER — Other Ambulatory Visit: Payer: Self-pay

## 2021-04-05 DIAGNOSIS — R0600 Dyspnea, unspecified: Secondary | ICD-10-CM | POA: Diagnosis not present

## 2021-04-05 LAB — BASIC METABOLIC PANEL
BUN/Creatinine Ratio: 13 (ref 9–23)
BUN: 16 mg/dL (ref 6–24)
CO2: 24 mmol/L (ref 20–29)
Calcium: 9 mg/dL (ref 8.7–10.2)
Chloride: 106 mmol/L (ref 96–106)
Creatinine, Ser: 1.2 mg/dL — ABNORMAL HIGH (ref 0.57–1.00)
Glucose: 94 mg/dL (ref 70–99)
Potassium: 4.3 mmol/L (ref 3.5–5.2)
Sodium: 141 mmol/L (ref 134–144)
eGFR: 56 mL/min/{1.73_m2} — ABNORMAL LOW (ref >59)

## 2021-04-05 LAB — ECHOCARDIOGRAM COMPLETE
AR max vel: 1.7 cm2
AV Area VTI: 1.67 cm2
AV Area mean vel: 1.68 cm2
AV Mean grad: 4 mmHg
AV Peak grad: 8.4 mmHg
Ao pk vel: 1.45 m/s
Area-P 1/2: 4.1 cm2
Calc EF: 51.5 %
S' Lateral: 3.08 cm
Single Plane A2C EF: 52.4 %
Single Plane A4C EF: 55.3 %

## 2021-04-13 ENCOUNTER — Telehealth: Payer: Self-pay | Admitting: Cardiovascular Disease

## 2021-04-13 MED ORDER — METOLAZONE 2.5 MG PO TABS: 2.5000 mg | ORAL_TABLET | Freq: Every day | ORAL | 6 refills | Status: DC

## 2021-04-13 NOTE — Telephone Encounter (Signed)
° ° ° ° ° °"  Kidney function going back in the wrong direction.  Reduce metolazone to 2.5 mg when she takes it"

## 2021-04-13 NOTE — Telephone Encounter (Signed)
Patient returning call for lab results. 

## 2021-04-26 ENCOUNTER — Inpatient Hospital Stay: Payer: Medicare Other | Attending: Oncology

## 2021-04-26 ENCOUNTER — Other Ambulatory Visit: Payer: Self-pay | Admitting: Hematology and Oncology

## 2021-04-26 ENCOUNTER — Other Ambulatory Visit: Payer: Self-pay

## 2021-04-26 VITALS — BP 171/106 | HR 75 | Temp 98.6°F | Resp 18

## 2021-04-26 DIAGNOSIS — Z79818 Long term (current) use of other agents affecting estrogen receptors and estrogen levels: Secondary | ICD-10-CM | POA: Insufficient documentation

## 2021-04-26 DIAGNOSIS — C50412 Malignant neoplasm of upper-outer quadrant of left female breast: Secondary | ICD-10-CM | POA: Insufficient documentation

## 2021-04-26 MED ORDER — GOSERELIN ACETATE 3.6 MG ~~LOC~~ IMPL
3.6000 mg | DRUG_IMPLANT | Freq: Once | SUBCUTANEOUS | Status: AC
  Administered 2021-04-26: 3.6 mg via SUBCUTANEOUS
  Filled 2021-04-26: qty 3.6

## 2021-04-26 NOTE — Progress Notes (Signed)
Pt is here for zoladex, blood pressure is 171/106, asymptomatic and has already taken her blood pressure medication this morning. Dr. Chryl Heck is aware and has given the okay to proceed with injection.

## 2021-04-29 ENCOUNTER — Other Ambulatory Visit: Payer: Self-pay | Admitting: Cardiovascular Disease

## 2021-04-30 ENCOUNTER — Other Ambulatory Visit: Payer: Self-pay | Admitting: Nurse Practitioner

## 2021-05-01 ENCOUNTER — Other Ambulatory Visit: Payer: Self-pay | Admitting: Internal Medicine

## 2021-05-02 NOTE — Telephone Encounter (Signed)
Rx sent 

## 2021-05-17 ENCOUNTER — Ambulatory Visit (INDEPENDENT_AMBULATORY_CARE_PROVIDER_SITE_OTHER): Payer: Medicare Other | Admitting: Family

## 2021-05-17 ENCOUNTER — Encounter (HOSPITAL_BASED_OUTPATIENT_CLINIC_OR_DEPARTMENT_OTHER): Payer: Self-pay

## 2021-05-17 ENCOUNTER — Other Ambulatory Visit: Payer: Self-pay

## 2021-05-17 ENCOUNTER — Encounter (HOSPITAL_BASED_OUTPATIENT_CLINIC_OR_DEPARTMENT_OTHER): Payer: Self-pay | Admitting: Family

## 2021-05-17 VITALS — BP 188/82 | HR 86 | Ht 65.0 in | Wt 217.3 lb

## 2021-05-17 DIAGNOSIS — Z79899 Other long term (current) drug therapy: Secondary | ICD-10-CM

## 2021-05-17 DIAGNOSIS — I5032 Chronic diastolic (congestive) heart failure: Secondary | ICD-10-CM | POA: Diagnosis not present

## 2021-05-17 DIAGNOSIS — I25118 Atherosclerotic heart disease of native coronary artery with other forms of angina pectoris: Secondary | ICD-10-CM | POA: Diagnosis not present

## 2021-05-17 DIAGNOSIS — I10 Essential (primary) hypertension: Secondary | ICD-10-CM

## 2021-05-17 MED ORDER — METOLAZONE 2.5 MG PO TABS: 2.5000 mg | ORAL_TABLET | ORAL | 2 refills | Status: DC

## 2021-05-17 NOTE — Patient Instructions (Signed)
Medication Instructions:  Continue your current medications.   We will consider adjusting your medications based on lab work.   *If you need a refill on your cardiac medications before your next appointment, please call your pharmacy*   Lab Work: Your physician recommends that you return for lab work today: BMP, BNP  If you have labs (blood work) drawn today and your tests are completely normal, you will receive your results only by: MyChart Message (if you have MyChart) OR A paper copy in the mail If you have any lab test that is abnormal or we need to change your treatment, we will call you to review the results.   Testing/Procedures: Your EKG today shows normal sinus rhythm which is a good result!   Follow-Up: At [facility], you and your health needs are our priority.  As part of our continuing mission to provide you with exceptional heart care, we have created designated Provider Care Teams.  These Care Teams include your primary Cardiologist (physician) and Advanced Practice Providers (APPs -  Physician Assistants and Nurse Practitioners) who all work together to provide you with the care you need, when you need it.  We recommend signing up for the patient portal called "MyChart".  Sign up information is provided on this After Visit Summary.  MyChart is used to connect with patients for Virtual Visits (Telemedicine).  Patients are able to view lab/test results, encounter notes, upcoming appointments, etc.  Non-urgent messages can be sent to your provider as well.   To learn more about what you can do with MyChart, go to [email or web address].    Your next appointment:   3 month(s)  The format for your next appointment:   In Person  Provider:   Skeet Latch, MD or Loel Dubonnet, NP    Other Instructions  Heart Healthy Diet Recommendations: A low-salt diet is recommended. Meats should be grilled, baked, or boiled. Avoid fried foods. Focus on lean protein  sources like fish or chicken with vegetables and fruits. The American Heart Association is a Microbiologist!  American Heart Association Diet and Lifeystyle Recommendations    Exercise recommendations: The American Heart Association recommends 150 minutes of moderate intensity exercise weekly. Try 30 minutes of moderate intensity exercise 4-5 times per week. This could include walking, jogging, or swimming.

## 2021-05-17 NOTE — Progress Notes (Signed)
Office Visit    Patient Name: Erica [name] Date of Encounter: 05/17/2021  PCP:  Kathyrn Lass   [facility]  Cardiologist:  Skeet Latch, MD  Advanced Practice Provider:  No care team member to display Electrophysiologist:  None    Chief Complaint    Erica [name] is a 46 y.o. female with a hx of resistant hypertension, chronic diastolic heart failure, breast cancer s/p chemo and XRT, morbid obesity, OSA intolerant of CPAP presents today for hypertension follow up.   Past Medical History    Past Medical History:  Diagnosis Date   Allergy    Anxiety    Apnea, sleep 05/04/2014   AHI 31/hr now on CPAP at 12cm H2O   Arthritis    Asthma    Asthma without status asthmaticus 01/19/2016   Axillary hidradenitis suppurativa 08/01/2011   Blood transfusion without reported diagnosis    Breast cancer ([place]) 2010   a. locally advanced left breast carcinoma diagnosed in 2010 and treated with neoadjuvant chemotherapy with docetaxel and cyclophosphamide as well as paclitaxel. This was followed by radiation therapy which was completed in 2011.   Chemotherapy-induced peripheral neuropathy ([place]) 12/09/2016   CHF (congestive heart failure) (HCC)    Chronic diastolic CHF (congestive heart failure) ([place]) 06/23/2013   Chronic kidney disease    Chronic pain in left foot 06/29/2018   Chronic stable angina ([place]) 08/22/2020   Cough variant asthma 09/27/2010   Followed in Pulmonary clinic/ [facility]/ [place]  Onset reported in childhood  - PFT's 01/31/2011 FEV1  1.76 (60% ) ratio 68 and no better p B2,  DLCO 70% -med review 09/25/2012 > [place] drug assistance  paperwork given    - HFA 90% 11/20/2012 .  -Med calendar 12/07/12 , 04/06/2013 , 04/18/2014,, 08/30/2014 , 11/08/2014  - PFT's 01/12/2013  1.73 (65%)  Ratio 73 and no better p B2, DLCO 86% - trial off    Depression with somatization 06/15/2015   Dyspnea on exertion    a. 01/2013 Lexi MV: EF 54%, no ischemia/infarct;  b. 05/2013 Echo: EF  60-65%, no rwma, Gr 2 DD;  b.    Essential hypertension 10/09/2014   Estimated Creatinine Clearance: 82.4 mL/min (by C-G formula based on SCr of 0.98 mg/dL).   Fatty liver    Gastroparesis    GERD (gastroesophageal reflux disease)    Hyperglycemia 04/13/2011   Hypertension    Hypertensive heart disease 09/26/2015   Hypertriglyceridemia without hypercholesterolemia 06/15/2015   Framingham risk score is 1%    Idiopathic intracranial hypertension 10/21/2014   Impaired glucose tolerance 04/13/2011   Insomnia secondary to anxiety 04/05/2015   Lymphadenitis, chronic    restricted LEFT extremity   Malignant neoplasm of upper-outer quadrant of left breast in female, estrogen receptor negative ([place]) 01/07/2013   Stage III, Invasive Ductal, s/p partial Left Mastectomy/Lumpectomy (baseball sized) with Lymph Node dissection, had Chemo and RadRx     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[place]) 09/27/2010   OSA (obstructive sleep apnea) 05/04/2014   AHI 31/hr now on CPAP at 12cm H2O   Oxygen deficiency    Uses 02 in CPAP   Persistent proteinuria 03/31/2018   Personal history of chemotherapy 2010   Personal history of radiation therapy 2010   Sleep apnea    SVT (supraventricular tachycardia) ([place]) 08/22/2020   Tibialis posterior tendinitis, left 07/15/2018   Injected in March 10, 2019   Upper airway cough syndrome 10/09/2014   Onset around 2012 with assoc pseudowheeze  MRI 05/18/15 > Paranasal sinuses are clear - 11/17/2015 rec increase h1 to 2 hs to help with noct cough  - Allergy profile 11/17/2015 >  Eos 0.1 /  IgE  80 POS RAST  to grass, ragweed, cedar tree  - d/c acei 03/06/2018  - flare end of Jan 2020 in setting of ? Uri/ poorly controlled GERD - flare mid Oct 2020 with uri / assoc secondary gerd   Vitamin D deficiency disease 03/31/2018   Past Surgical History:  Procedure Laterality Date   BRAIN SURGERY     [place]   BREAST LUMPECTOMY Left 09/2008   CARPAL TUNNEL RELEASE Left 2011   LYMPH NODE DISSECTION Left 2010    breast; 2 wks after breast lumpectomy   RIGHT/LEFT HEART CATH AND CORONARY ANGIOGRAPHY N/A 05/04/2019   Procedure: RIGHT/LEFT HEART CATH AND CORONARY ANGIOGRAPHY;  Surgeon: Jolaine Artist, MD;  Location: [facility];  Service: Cardiovascular;  Laterality: N/A;  Renal injection preformed    RIGHT/LEFT HEART CATH AND CORONARY ANGIOGRAPHY N/A 10/25/2020   Procedure: RIGHT/LEFT HEART CATH AND CORONARY ANGIOGRAPHY;  Surgeon: Leonie Man, MD;  Location: [facility];  Service: Cardiovascular;  Laterality: N/A;   TENDON RELEASE Right 2012   "hand"   TUBAL LIGATION  05/11/2011   Procedure: POST PARTUM TUBAL LIGATION;  Surgeon: Emeterio Reeve, MD;  Location: [facility];  Service: Gynecology;  Laterality: Bilateral;  Induced for HTN    Allergies  Allergies  Allergen Reactions   Aspirin Shortness Of Breath and Palpitations    Pt can take ibuprofen without reaction   Doxycycline Anaphylaxis   Kiwi Extract Itching and Swelling    Lip swelling   Metoprolol Hives and Itching   Penicillins Shortness Of Breath and Palpitations    Has patient had a PCN reaction causing immediate rash, facial/tongue/throat swelling, SOB or lightheadedness with hypotension: Yes Has patient had a PCN reaction causing severe rash involving mucus membranes or skin necrosis: Yes Has patient had a PCN reaction that required hospitalization Yes Has patient had a PCN reaction occurring within the last 10 years: No If all of the above answers are "NO", then may proceed with Cephalosporin use.   Strawberry Extract Hives   Sulfamethoxazole-Trimethoprim Anaphylaxis    Facial, throat and tongue swelling with difficulty swallowing.    History of Present Illness    Erica [name] is a 46 y.o. female with a hx of resistant hypertension, chronic diastolic heart failure, breast cancer s/p chemo and XRT, morbid obesity, OSA intolerant of CPAP last seen 03/16/21 by Dr. Oval Linsey  She has history of hypertension dating back  to treatment of breast cancer in 2010. She has established with Dr. Oval Linsey in the hypertension clinic. Previous echocardiogram 03/2018 LVEF 69-62%, grade 2 diastolic dysfunction, mild MR, PASP 33. Renal artery duppler 2019 with bilateral 1-59% stenosis. CT-A abdomen with 1.3 cm renal cyst but no adrenal adenomas. No evidence of renal artery stenosis.   She has a history of recurrent syncope. 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 She has sleep apnea but is intolerant of CPAP and now uses BIPAP. She had [facility] 05/2019 with finding of 60% distal LCx and 80% distal LAD disease. She was started on Clopidogrel due to allergy to asthma. LE duplex 10/2019 for lower extremity edema was negative for DVT.   There have been concerns previously about whether she is taking medications as prescribed given discrepancies between what was ordered  versus picked up from the pharmacy. She reports taking medications as prescribed.   Blood pressure has been difficult to control. Doxazosin ordered at initial clinic visit. Amlodipine subsequently added. She has been given Metolazone in addition to Torsemide due to edema. She has previously been interested in decreasing medications despite markedly elevated blood pressure. Her hydralazine was reduced to 50m TID and doxazosin increased to 854mtwice daily to try to reduce TID dosing. Ranexa increased due to chest pain with improvement. Clonidine increased to 0.78m49mD. She wore monitor 03/23/20  showing PVC with up to 8 beats of SVT.   She was seen 08/22/20 by Dr. RanOval Linseyer BP at home was 150s-160s in setting of taking Prednisone. She was bradycardia int he 50s but asymptomatic. [facility] Hydralazine was increased to 100m49mD. Amlodipine, Clonidine, Diltiazem, Doxazosin, HCTZ, Minoxidil, Spironolactone, Nebivolol, and Entresot were continued.   Seen in follow up 10/23/20. [facility] with distal LAD 80% stenosed, LPAV lesion 30% stenosed with 50% stenosed side branch in first LPL. Hyperdynamic LV systolic function was noted. Hemodynamic findings were consistent with mild secondary pulmonary hypertension. It was overall unchanged from previous. Angina was suspected due to accelerated hypertension, increased wall stress, and microvascular ischemia. Distal LAD not favorable for PCI but could be considered for intractable pain. BP markedly elevated 200s/110s which improved after medication to 152/78.  She has been referred to psychiatry due to anxiety. When seen 03/01/21 she was volume overloaded and Torsemide increased and provided Metolazone. At time of clinic visit 03/16/21 volume status was improved and echo ordered. Echo 04/05/21 normal LVEF 55-60%, mild LVH, normal diastolic parameters, mildly elevated PASP, bilateral atria moderately dilated, mild MR. Based on follow up labs her Metolazone was reduced from 5mg 68m2.5mg w61mly. Based on echocardiogram she was recommended to increase Minoxidil to 5mg [id number] does not appear this change was made.  Presents today for follow up. BP most often 150s/70s at home. She has been trying to walk for exercise. Not yet back to the gym. Swelling daily in her legs about the same, no edema on exam. Reports rare fleeting chest pain which is not of concern to her, has not taken nitroglycerin. Dyspnea on exertion unchanged compared to previous.   Medication and date last picked up 90 day supply per [facility] [place]:  Medication Date  Amlodipine 10mg 1/278m  Diltiazem 240mg 1/9778m Doxazosin Not on file  Hydralazine 100mg 12/561m HCTZ 25mg 2/10/28mMinoxidil Not on file  Nebivolol 20mg 12/5/243manolazone 1000mg 2/10/234mtresto 97-103 05/11/21  Spironolactone 100mg 05/11/21 1msemide 40mg  03/05/22 51mKGs/Labs/Other Studies Reviewed:   The following studies were reviewed today:  Echo 04/05/21  1. Left  ventricular ejection fraction, by estimation, is 55 to 60%. The  left ventricle has normal function. The left ventricle has no regional  wall motion abnormalities. There is mild left ventricular hypertrophy.  Left ventricular diastolic parameters  were normal. The average left ventricular global longitudinal strain is  -15.0 %. The global longitudinal strain is abnormal.   2. Right ventricular systolic function is normal. The right ventricular  size is mildly enlarged. There is mildly elevated pulmonary artery  systolic pressure. The estimated right ventricular systolic pressure is  40.2 mmHg.   3.16.1t atrial size was moderately dilated.   4. Right atrial size was moderately dilated.   5. The mitral  valve is normal in structure. Mild mitral valve  regurgitation. No evidence of mitral stenosis.   6. The aortic valve is normal in structure. Aortic valve regurgitation is  not visualized. No aortic stenosis is present.   7. The inferior vena cava is normal in size with greater than 50%  respiratory variability, suggesting right atrial pressure of 3 mmHg.   Comparison(s): EF 45%, moderate LVH, GLS -13.1%.   __________________________________________________   [facility] 10/25/20   Dist LAD lesion is 80% stenosed.   LPAV lesion is 30% stenosed with 50% stenosed side branch in 1st LPL.   There is hyperdynamic left ventricular systolic function.  The left ventricular ejection fraction is greater than 65% by visual estimate.   Hemodynamic findings consistent with mild (secondary) pulmonary hypertension - LVEDP ~25 mmHg, PCWP ~19-20 mmHg   Normal Cardiac Output / Index (7.91 / 3.94)   POST-OPERATIVE DIAGNOSIS:              Stable single-vessel CAD with apical LAD 80% stenosis-no change from previous cath.  Otherwise very tortuous coronary arteries with no significant stenoses.            Mild (likely secondary) Pulmonary Hypertension with PA mean pressure of 28 mmHg (PAP 39/15 mmHg), and PCWP of 19  mmHg with LVEDP of 25 mmHg.            Significant systolic hypertension with initial pressures in the 200s over 110s, however after medication 152/78 mmHg with MAP of 109 mmHg; LVP/EDP 155/13 mmHg - 25 mmHg.            Ao sat 99%, PA sat 81%.  Fick Cardiac Output-Index: 7.91-3.84 (normal)            Suspect angina is due to accelerated hypertension, increased wall stress and microvascular ischemia.  Distal LAD lesion is not favorable for PCI (however if pain is intractable, could be considered)   __________________________________________________  Echo 03/12/18: Study Conclusions  - Left ventricle: The cavity size was normal. Wall thickness was    increased in a pattern of moderate LVH. Systolic function was    mildly reduced. The estimated ejection fraction was in the range    of 45% to 50%. Wall motion was normal; there were no regional    wall motion abnormalities. Features are consistent with a    pseudonormal left ventricular filling pattern, with concomitant    abnormal relaxation and increased filling pressure (grade 2    diastolic dysfunction).  - Mitral valve: There was mild regurgitation.  - Pulmonary arteries: Systolic pressure was mildly increased. PA    peak pressure: 33 mm Hg (S).   __________________________________________________  [facility] Myoview 04/20/18:   The left ventricular ejection fraction is moderately decreased (30-44%). Nuclear stress EF: 41%. There was no ST segment deviation noted during stress. This is an intermediate risk study.   Abnormal, intermediate risk stress nuclear study with no ischemia or infarction; EF 41 with global hypokinesis and mild LVE; study intermediate risk due to reduced LV function.  __________________________________________________   Renal artery Doppler 10/29/18: Right: 1-59% stenosis of the right renal artery. Cyst(s) noted.  Left:  1-59% stenosis of the left renal artery.      __________________________________________________  LHC/RHC 05/04/19: Dist LAD-1 lesion is 30% stenosed. Dist LAD-2 lesion is 80% stenosed. Dist Cx lesion is 60% stenosed.   Findings:   Ao = 173/104 (134) LV =  181/25 RA =  10 RV =  41/13 PA =  42/10 (26)  PCW = 18 Fick cardiac output/index = 5.8/2.9 PVR = 1.4 wu Ao sat = 98% PA sat = 73%, 73%   Assessment: 1. 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 LVEF 55-60% 3. Mildly elevated filling pressures with mild pulmonary venous HTN m4. Widely patent bilateral renal arteries. No evidence of AAA 5. Severe systemic HTN  __________________________________________________  Monitor 03/23/2020: 3 Day Zio Monitor   Quality: Fair.  Baseline artifact. Predominant rhythm: sinus Average heart rate: 84 bpm Max heart rate: 140 bpm Min heart rate: 57 bpm Pauses >2.5 seconds: none Occasional PVCs and ventricular bigeminy Two runs of SVT up to 8 beats.  Rate 224 bpm.   EKG:  EKG is  ordered today.  The ekg performed today demonstrates NSR 86 bpm with known TWI lead III, V6.   Recent Labs: 12/07/2020: ALT 33; Hemoglobin 13.8; Platelets 218 03/01/2021: BNP 41.9 04/05/2021: BUN 16; Creatinine, Ser 1.20; Potassium 4.3; Sodium 141  Recent Lipid Panel    Component Value Date/Time   CHOL 199 08/22/2020 0912   TRIG 114 08/22/2020 0912   HDL 44 08/22/2020 0912   CHOLHDL 4.5 (H) 08/22/2020 0912   CHOLHDL 4 10/01/2018 1115   VLDL 22.6 10/01/2018 1115   LDLCALC 134 (H) 08/22/2020 0912   LDLDIRECT 152.0 03/30/2018 1604   Home Medications   Current Meds  Medication Sig   acetaZOLAMIDE (DIAMOX) 250 MG tablet Take 4 tablets (1,000 mg total) by mouth 3 (three) times daily.   albuterol (PROVENTIL) (2.5 MG/3ML) 0.083% nebulizer solution Take 3 mLs (2.5 mg total) by nebulization every 6 (six) hours as needed for wheezing or shortness of breath. USE 1 VIAL VIA NEBULIZER EVERY 4 HOURS AND AS NEEDED FOR WHEEZING OR SHORTNESS OF BREATH Strength: (2.5 MG/3ML)  0.083%   albuterol (VENTOLIN HFA) 108 (90 Base) MCG/ACT inhaler TAKE 2 PUFFS BY MOUTH EVERY 6 HOURS AS NEEDED FOR WHEEZE OR SHORTNESS OF BREATH   AMBULATORY NON FORMULARY MEDICATION Carpal Tunnel Wrist brace. Rt  Carpal Tunnel Syndrome G56.01  Disp 1   AMBULATORY NON FORMULARY MEDICATION Lace up ankle brace (ASO type)  Ankle sprain S93.491A  Disp 1   amitriptyline (ELAVIL) 100 MG tablet TAKE 1 TABLET BY MOUTH EVERYDAY AT BEDTIME (Patient taking differently: Take 100 mg by mouth at bedtime.)   Biotin 5000 MCG TABS Take 5,000 mg by mouth 2 (two) times daily.    budesonide-formoterol (SYMBICORT) 160-4.5 MCG/ACT inhaler INHALE 2 PUFFS INTO LUNGS 2 TIMES DAILY AS DIRECTED   Cholecalciferol 1.25 MG (50000 UT) capsule Take 1 capsule (50,000 Units total) by mouth once a week. (Patient taking differently: Take 4,000 Units by mouth daily. 2000 mg each  gummies)   cloNIDine (CATAPRES - DOSED IN MG/24 HR) 0.3 mg/24hr patch PLACE 2 PATCHES ONTO SKIN ONCE WEEKLY   clopidogrel (PLAVIX) 75 MG tablet Take 1 tablet (75 mg total) by mouth daily.   clotrimazole (MYCELEX) 10 MG troche Take 1 tablet (10 mg total) by mouth 5 (five) times daily.   COVID-19 mRNA bivalent vaccine, Moderna, (MODERNA COVID-19 BIVAL BOOSTER) 50 MCG/0.5ML injection Inject into the muscle.   dexlansoprazole (DEXILANT) 60 MG capsule Take 1 capsule (60 mg total) by mouth daily.   dextromethorphan-guaiFENesin (MUCINEX DM) 30-600 MG 12hr tablet Take 2 tablets by mouth 2 (two) times daily.   doxazosin (CARDURA) 8 MG tablet Take 1 tablet (8 mg total) by mouth 2 (two) times daily. (Patient taking differently: Take 8 mg by mouth at bedtime.)   DULoxetine (CYMBALTA) 30 MG capsule TAKE  1 CAPSULE (30 MG TOTAL) BY MOUTH DAILY. WITH 60MG FOR TOTAL OF 90 MG (Patient taking differently: Take 30 mg by mouth See admin instructions. With 52m for total of 90 mg)   DULoxetine (CYMBALTA) 60 MG capsule TAKE 1 CAPSULE (60 MG TOTAL) BY MOUTH DAILY. FOR TOTAL  OF 90 MG (Patient taking differently: Take 60 mg by mouth See admin instructions. Take with 30 mg for a total of 90 mg in the morning)   ENTRESTO 97-103 MG TAKE 1 TABLET BY MOUTH TWICE A DAY   EPINEPHRINE 0.3 mg/0.3 mL IJ SOAJ injection INJECT 0.3 MLS INTO THE MUSCLE ONCE AS NEEDED FOR ANAPHYLAXIS (Patient taking differently: Inject 0.3 mg into the muscle as needed for anaphylaxis.)   ezetimibe (ZETIA) 10 MG tablet Take 1 tablet (10 mg total) by mouth daily.   famotidine (PEPCID) 20 MG tablet Take 1-2 tablets (20-40 mg total) by mouth at bedtime. (Patient taking differently: Take 40 mg by mouth at bedtime.)   gabapentin (NEURONTIN) 300 MG capsule Take 1 capsule (300 mg total) by mouth 3 (three) times daily. One three times daily (Patient taking differently: Take 300 mg by mouth 3 (three) times daily.)   hydrochlorothiazide (HYDRODIURIL) 25 MG tablet TAKE 1 TABLET (25 MG TOTAL) BY MOUTH DAILY.   lidocaine-prilocaine (EMLA) cream Apply 1 application topically every 28 (twenty-eight) days. Apply 1 application to skin ever 28 days   LORazepam (ATIVAN) 0.5 MG tablet 1-2 tabs 30 - 60 min prior to MRI. Do not drive with this medicine.   metoCLOPramide (REGLAN) 10 MG tablet TAKE 1 TABLET BY MOUTH 4 TIMES DAILY. (Patient taking differently: Take 10 mg by mouth 4 (four) times daily -  before meals and at bedtime.)   metolazone (ZAROXOLYN) 2.5 MG tablet Take 1 tablet (2.5 mg total) by mouth daily.   minoxidil (LONITEN) 2.5 MG tablet Take 2 tablets (5 mg total) by mouth 2 (two) times daily.   montelukast (SINGULAIR) 10 MG tablet TAKE 1 TABLET BY MOUTH EVERYDAY AT BEDTIME   Nebivolol HCl 20 MG TABS TAKE 2 TABLETS BY MOUTH EVERY DAY   nitroGLYCERIN (NITROSTAT) 0.4 MG SL tablet Place 1 tablet (0.4 mg total) under the tongue every 5 (five) minutes as needed for chest pain.   oxymetazoline (MUCINEX SINUS-MAX FULL FORCE) 0.05 % nasal spray Place 1 spray into both nostrils 2 (two) times daily.   PEG-KCl-NaCl-NaSulf-Na  Asc-C (PLENVU) 140 g SOLR Take 1 kit by mouth as directed. Manufacturer's coupon Universal coupon code:BIN: 0P2366821 GROUP:: [id number] PCN: [id number]; ID:: [id number] PAY NO MORE $50; NO prior authorization   Potassium Chloride ER 20 MEQ TBCR Take 2 tablets by mouth 2 (two) times daily.   primidone (MYSOLINE) 50 MG tablet Take 0.5 tablets (25 mg total) by mouth at bedtime.   promethazine (PHENERGAN) 25 MG tablet TAKE 1 TABLET (25 MG TOTAL) BY MOUTH 3 (THREE) TIMES DAILY AS NEEDED FOR NAUSEA OR VOMITING.   promethazine-dextromethorphan (PROMETHAZINE-DM) 6.25-15 MG/5ML syrup TAKE 5 MLS BY MOUTH 4 (FOUR) TIMES DAILY AS NEEDED FOR COUGH.   ranolazine (RANEXA) 1000 MG SR tablet TAKE 1 TABLET BY MOUTH TWICE A DAY   Respiratory Therapy Supplies (FLUTTER) DEVI Use as directed   rosuvastatin (CRESTOR) 40 MG tablet Take 1 tablet (40 mg total) by mouth daily.   Spacer/Aero-Holding Chambers (AEROCHAMBER MV) inhaler Use as instructed   spironolactone (ALDACTONE) 100 MG tablet Take 1 tablet (100 mg total) by mouth daily.   torsemide (DEMADEX) 20 MG tablet Take 2 tablets (40  mg total) by mouth 2 (two) times daily.   Current Facility-Administered Medications for the 05/17/21 encounter (Office Visit) with Loel Dubonnet, NP  Medication   sodium chloride flush (NS) 0.9 % injection 3 mL     Review of Systems      All other systems reviewed and are otherwise negative except as noted above.  Physical Exam    VS:  BP (!) 188/82 (BP Location: Right Arm, Patient Position: Sitting, Cuff Size: Large)    Pulse 86    Ht _0  (1.651 m)    Wt 217 lb 4.8 oz (98.6 kg)    LMP  (LMP Unknown)    BMI 36.16 kg/m  , BMI Body mass index is 36.16 kg/m.  Wt Readings from Last 3 Encounters:  05/17/21 217 lb 4.8 oz (98.6 kg)  03/19/21 216 lb 12.8 oz (98.3 kg)  03/16/21 217 lb 6.4 oz (98.6 kg)   GEN: Well nourished, overweight, well developed, in no acute distress. HEENT: normal. Neck: Supple, no JVD, carotid bruits, or  masses. Cardiac: RRR, no murmurs, rubs, or gallops. No clubbing, cyanosis, edema.  Radials/PT 2+ and equal bilaterally.  Respiratory:  Respirations regular and unlabored, clear to auscultation bilaterally. GI: Soft, nontender, nondistended. MS: No deformity or atrophy. Skin: Warm and dry, no rash. Neuro:  Strength and sensation are intact. Psych: Normal affect.  Assessment & Plan    Malignant hypertension - BP not controlled. Reports home BP's 150-160 on wrist cuff.  Endorses compliance with medications though Doxazosin and Minoxidil not noted by pharmacy as detailed in HPI. MyChart message sent. Encouraged to bring to pill bottles to next appointment. Previously intolerance to Imdur. Present prescribed antihypertensive regimen includes Clonidine patch, Amlodipine, Diltiazem, Doxazosin, Hydralazine, HCTZ, Torsemide, Nebivolol, Ranexa, Spironolactone.  However reports she has not been taking doxazosin as she thought diltiazem replace doxazosin.  Heart healthy diet and regular cardiovascular exercise encouraged.  She has [facility] PREP program at [facility]. Walking at home but has not returned to gym.   Mychart message sent to patient to confirm she is taking Minoxidil and Doxazosin as on her med list but not on pharmacy list.  She is taking minoxidil but not doxazosin. BMP today and adjustment of antihypertensive regimen pending results.   Chronic stable angina / CAD - Intolerant of Imdur due to headache.She is on maximum dose Ranexa. GDMT includes Plavix (no Aspirin due to allergy), Nebivolol, Rosuvastatin, Zetia. R/LHC 09/2020 with stable single vessel CAD with apical 80% stenosis unchanged with torturous coronary arteries. Distal LAD lesion not favorable for PCI. Reports occasional left sided chest discomfort which is fleeting and not requiring nitroglycerin. No indication for ischemic evaluation at this time. EKG today with no acute ST/T wave changes.   Hypertriglyceridemia / HLD, LDL goal <70 -  08/22/20 total cholesterol 199, HDL 44, triglycerides 114, LDL 134, AST 30, ALT 35. Continue Zetia 10 QD, Rosuvastatin 62m QD. Consider repeat lipid panel at follow up.   OSA - BIPAP compliance encouraged. Reports wearing every evening with her oxygen. She follows with Dr. TRadford Paxin regards to her OSA.   Chronic diastolic heart failure -euvolemic on exam with no lower extremity edema.  Her weight is stable compared to clinic visit 2 months ago.  She shows me that she still believes she is holding onto excess fluid.  We will check BNP today. If elevated and renal function improved, consider short course of increased diuresis.   Asthma - Follows with pulmonology Dr. WMelvyn Novas  No  signs of acute exacerbation  SVT - No evidence of recurrence.   Breast cancer - 08/2020 mammogram no evidence of malignancy. On Goserelin every 4 weeks until age 89 per oncology team.   Disposition: Follow up in 3 months with Dr. Oval Linsey or APP.  Signed, Loel Dubonnet, NP 05/17/2021, 10:42 AM [place]

## 2021-05-17 NOTE — Telephone Encounter (Signed)
Follow up for you

## 2021-05-18 ENCOUNTER — Other Ambulatory Visit (HOSPITAL_BASED_OUTPATIENT_CLINIC_OR_DEPARTMENT_OTHER): Payer: Self-pay | Admitting: Family

## 2021-05-18 ENCOUNTER — Telehealth (HOSPITAL_BASED_OUTPATIENT_CLINIC_OR_DEPARTMENT_OTHER): Payer: Self-pay

## 2021-05-18 LAB — BASIC METABOLIC PANEL
BUN/Creatinine Ratio: 12 (ref 9–23)
BUN: 13 mg/dL (ref 6–24)
CO2: 27 mmol/L (ref 20–29)
Calcium: 9.8 mg/dL (ref 8.7–10.2)
Chloride: 106 mmol/L (ref 96–106)
Creatinine, Ser: 1.08 mg/dL — ABNORMAL HIGH (ref 0.57–1.00)
Glucose: 100 mg/dL — ABNORMAL HIGH (ref 70–99)
Potassium: 4.2 mmol/L (ref 3.5–5.2)
Sodium: 146 mmol/L — ABNORMAL HIGH (ref 134–144)
eGFR: 64 mL/min/{1.73_m2} (ref >59)

## 2021-05-18 LAB — BRAIN NATRIURETIC PEPTIDE: BNP: 67.4 pg/mL (ref 0.0–100.0)

## 2021-05-18 MED ORDER — DOXAZOSIN MESYLATE 8 MG PO TABS: 8.0000 mg | ORAL_TABLET | Freq: Two times a day (BID) | ORAL | 3 refills | Status: DC

## 2021-05-18 MED ORDER — MINOXIDIL 2.5 MG PO TABS: 5.0000 mg | ORAL_TABLET | Freq: Two times a day (BID) | ORAL | 3 refills | Status: DC

## 2021-05-18 NOTE — Telephone Encounter (Addendum)
Results called to patient, refills provided to preferred pharmacy!   ----- Message from Loel Dubonnet, NP sent at 05/18/2021  3:49 PM EST ----- BNP with no significant volume overload. Kidney function improving.   Recommend resume Doxazosin 8mg  BID. Please provide refill.  Please provide patient with refill of Minoxidil 5mg  BID.

## 2021-05-18 NOTE — Telephone Encounter (Signed)
Please advise thank you

## 2021-05-24 ENCOUNTER — Other Ambulatory Visit: Payer: Self-pay

## 2021-05-24 ENCOUNTER — Inpatient Hospital Stay: Payer: Medicare Other | Attending: Oncology

## 2021-05-24 VITALS — BP 195/98 | HR 73 | Temp 98.7°F | Resp 18

## 2021-05-24 DIAGNOSIS — C50412 Malignant neoplasm of upper-outer quadrant of left female breast: Secondary | ICD-10-CM

## 2021-05-24 DIAGNOSIS — Z79818 Long term (current) use of other agents affecting estrogen receptors and estrogen levels: Secondary | ICD-10-CM | POA: Diagnosis present

## 2021-05-24 MED ORDER — GOSERELIN ACETATE 3.6 MG ~~LOC~~ IMPL
3.6000 mg | DRUG_IMPLANT | SUBCUTANEOUS | Status: DC
  Administered 2021-05-24: 3.6 mg via SUBCUTANEOUS
  Filled 2021-05-24: qty 3.6

## 2021-05-30 ENCOUNTER — Inpatient Hospital Stay: Admission: RE | Admit: 2021-05-30 | Payer: Medicare Other | Source: Ambulatory Visit

## 2021-05-30 ENCOUNTER — Encounter: Payer: Self-pay | Admitting: Oncology

## 2021-06-06 ENCOUNTER — Other Ambulatory Visit: Payer: Self-pay | Admitting: Cardiovascular Disease

## 2021-06-06 NOTE — Telephone Encounter (Signed)
Rx(s) sent to pharmacy electronically.  

## 2021-06-19 NOTE — Progress Notes (Signed)
? ?I, [name], LAT, ATC, am serving as scribe for Dr. Lynne Leader. ? ?Erica [name] is a 46 y.o. female who presents to [place] at [facility] today for R arm pain.  She was last seen by [name] on 09/04/20 for f/u of B ankle pain, R wrist and neck pain.  Today, pt reports R arm still has paraesthesia. Pt now c/o R shoulder pain ongoing since 3/18.  She locates her pain to anterior aspect of her R shoulder. Unable to obtain BP due to pain in the R arm and lymphedema in her L. ? ?Neck pain: no ?Radiating pain: yes- into upper arm ?UE paresthesias: yes  ?Aggravating factors: any usage ?Treatments tried: heat, Tylenol ? ?Diagnostic imaging: C-spine XR- 09/04/20 ? ?Pertinent review of systems: No fevers or chills ? ?Relevant historical information: Difficult to control hypertension.  History of breast cancer ? ? ?Exam:  ?Pulse 72   Ht '5\' 5"'$  (1.651 m)   Wt 216 lb (98 kg)   LMP  (LMP Unknown)   SpO2 98%   BMI 35.94 kg/m?  ?General: Well Developed, well nourished, and in no acute distress.  ? ?MSK: Right shoulder: Normal-appearing ?Nontender. ?Range of motion limited abduction 100 degrees with pain. ?Internal rotation to lateral iliac crest. ?External rotation full. ?Strength 4/5 abduction 4/5 external rotation 5/5 internal rotation. ?Positive Hawkins and Neer's test. ?Negative Yergason's and speeds test. ? ? ? ?Lab and Radiology Results ? ?Procedure: Real-time Ultrasound Guided Injection of right shoulder subacromial bursa ?Device: Philips Affiniti 50G ?Images permanently stored and available for review in PACS ?Ultrasound evaluation prior to injection reveals mild subacromial bursitis ?Verbal informed consent obtained.  Discussed risks and benefits of procedure. Warned about infection bleeding damage to structures skin hypopigmentation and fat atrophy among others. ?Patient expresses understanding and agreement ?Time-out conducted.   ?Noted no overlying erythema, induration, or other signs  of local infection.   ?Skin prepped in a sterile fashion.   ?Local anesthesia: Topical Ethyl chloride.   ?With sterile technique and under real time ultrasound guidance: 40 mg of Kenalog and 2 mL of Marcaine injected into subacromial bursa. Fluid seen entering the bursa.   ?Completed without difficulty   ?Pain immediately resolved suggesting accurate placement of the medication.   ?Advised to call if fevers/chills, erythema, induration, drainage, or persistent bleeding.   ?Images permanently stored and available for review in the ultrasound unit.  ?Impression: Technically successful ultrasound guided injection. ? ? ? ? ? ? ? ?Assessment and Plan: ?46 y.o. female with shoulder pain thought to be due to subacromial bursitis.  Possibly the pain could be due to adhesive capsulitis that is just developing.  She does have risk factors for both.  Plan for subacromial injection today and home exercise program.  If this does not work I would recommend that she return sooner for trial of glenohumeral injection.  Otherwise check back in 1 month. ? ? ?PDMP not reviewed this encounter. ?Orders Placed This Encounter  ?Procedures  ? [place] LIMITED JOINT SPACE STRUCTURES UP RIGHT(NO LINKED CHARGES)  ?  Order Specific Question:   Reason for Exam (SYMPTOM  OR DIAGNOSIS REQUIRED)  ?  Answer:   right shoulder pain  ?  Order Specific Question:   Preferred imaging location?  ?  Answer:   [place]  ? [place] LIMITED JOINT SPACE STRUCTURES UP RIGHT(NO LINKED CHARGES)  ?  Order Specific Question:   Reason for Exam (SYMPTOM  OR DIAGNOSIS REQUIRED)  ?  Answer:   rt shoulder  ?  Order Specific Question:   Preferred imaging location?  ?  Answer:   [place]  ? ?No orders of the defined types were placed in this encounter. ? ? ? ?Discussed warning signs or symptoms. Please see discharge instructions. Patient expresses understanding. ? ? ?The above documentation has been reviewed and is accurate and  complete Lynne Leader, M.D. ? ? ?

## 2021-06-20 ENCOUNTER — Ambulatory Visit: Payer: Self-pay

## 2021-06-20 ENCOUNTER — Ambulatory Visit (INDEPENDENT_AMBULATORY_CARE_PROVIDER_SITE_OTHER): Payer: Medicare Other | Admitting: Family Medicine

## 2021-06-20 ENCOUNTER — Other Ambulatory Visit: Payer: Self-pay

## 2021-06-20 ENCOUNTER — Encounter: Payer: Self-pay | Admitting: Family Medicine

## 2021-06-20 VITALS — HR 72 | Ht 65.0 in | Wt 216.0 lb

## 2021-06-20 DIAGNOSIS — M25511 Pain in right shoulder: Secondary | ICD-10-CM | POA: Diagnosis not present

## 2021-06-20 NOTE — Patient Instructions (Addendum)
Thank you for coming in today.  ? ?You received a steroid injection in your right shoulder today. Seek immediate medical attention if the joint becomes red, extremely painful, or is oozing fluid.  ? ?Let me know if this injection does not help and we can try a different injection. ? ?Recheck back in 1 month ?

## 2021-06-21 ENCOUNTER — Inpatient Hospital Stay: Payer: Medicare Other | Attending: Oncology

## 2021-06-21 VITALS — BP 180/85 | HR 62 | Temp 98.8°F

## 2021-06-21 DIAGNOSIS — C50412 Malignant neoplasm of upper-outer quadrant of left female breast: Secondary | ICD-10-CM | POA: Insufficient documentation

## 2021-06-21 DIAGNOSIS — Z79818 Long term (current) use of other agents affecting estrogen receptors and estrogen levels: Secondary | ICD-10-CM | POA: Diagnosis present

## 2021-06-21 MED ORDER — GOSERELIN ACETATE 3.6 MG ~~LOC~~ IMPL
3.6000 mg | DRUG_IMPLANT | SUBCUTANEOUS | Status: DC
  Administered 2021-06-21: 3.6 mg via SUBCUTANEOUS
  Filled 2021-06-21: qty 3.6

## 2021-06-24 NOTE — Progress Notes (Signed)
? ?Subjective:  ? ?Patient ID: KSENIYA GRUNDEN, female     DOB: 01/28/1976    MRN: [id number] ? ?  ?Brief patient profile:  ?78  yobf  never smoker with asthma all her life much worse since around 07/2010 referred by Dr [name] to pulmonary clinic in Sept 2012 ?Hx of Breast Cancer 05/2008  ? ? ?History of Present Illness  ?12/18/2010 Initial pulmonary office eval on acei and  advair cc dtc asthma x 4 months but baseline = excess saba use "even when better" at least twice daily despite daily advair, worse at hs in fact  used saba 3 x last night.  On chemo /RT for breast ca but last rx was Jan 2012 well before asthma worse.  To er multiple times >  no better with singular added x 3 months. ?rec ?Stop Lisinopril ?Dulera 200 Take 2 puffs first thing in am and then another 2 puffs about 12 hours later.  ?Work on inhaler technique  ?Nexium 40 mg   Take 30-60 min before first meal of the day and Pepcid 20 mg one bedtime until return ?Only use your albuterol as a rescue medication to be used if you can't catch your breath by resting or doing a relaxed purse lip breathing pattern. The less you use it, the better it will work when you need it.  ?Prednisone 10 mg take  4 each am x 2 days,   2 each am x 2 days,  1 each am x2days and stop ? ? ? ?01/03/2011 f/u ov/Tierra Divelbiss cc much better off ace and advair but still needing saba twice daily on avg. No  Purulent sputum. ?rec no change in rx/ rule of 2's reviewed ?  ?  ?04/18/2014 NP Follow up and Med Review / NP  ?Patient returns for a two-week follow-up and medication review. ?We reviewed all her medications organize them into a medication count with patient education ?It appears that she is taking her medications correctly, however, she has over 30 medications and has had several changes recently. ?Last visit, her [name] was decreased to the 139mg says that her breathing was better on the 2039m ?Patient had a chest x-ray last visit that showed no acute changes ?She underwent a CT sinus  due to persistent cough , which was positive for sinusitis on the right ?She was treated with a ten-day course of Avelox, she has one dose left ?Does feel some better but still has cough . ?She denies any hemoptysis, orthopnea, PND or leg swelling  ?Patient has upcoming sleep study. Next week for suspected underlying sleep apnea with snoring and daytime sleepiness ?rec ?Refer to ENT for sinusitis and chronic cough > never saw one> f/u sinus CT neg 07/26/14  ?Increase Dulera 200 2 puffs Twice daily   ?Follow med calendar closely and bring to each visit.  ? ? ?  ?10/05/2014 f/u ov/Dannielle Baskins re: asthma with component of uacs/ no med calendar  ?Chief Complaint  ?Patient presents with  ? Follow-up  ?  Here to discuss meds- Dulera not on her drug formulary. Pt states breathing is doing well and denies any new co's today.  ?turns out using neb at least once every 24 h, esp between 2 and 4 am when wakes up with severe dry cough x months/ pred helps some but never eliminated noct cough ?rec ?symbicort 160 Take 2 puffs first thing in am and then another 2 puffs about 12 hours later.  ?Try taking the tramadol around 2  am to see if helps with the early am cough/ wheeze  ?See calendar for specific medication instructions and bring it back for each and every office visit  ?Separating the top medications from the bottom group is fundamental to providing you adequate care going forward.   ?Please schedule a follow up office visit in 2 weeks, sooner if needed with all medications in 2 separate bags  ? ? ?10/19/2014 f/u ov/Sheala Dosh re:  Asthma vs uacs/ not using med calendar correclty  ?Chief Complaint  ?Patient presents with  ? Follow-up  ?  Cough has changed sicnce starting symbicort- prod cough with "dirty" sputum. She states she feels that she can tell when her PM dose is due b/c she starts feeling SOB. She is using proair once per day on average.   ?not using symbicort 160 correctly  ?rec ?Prednisone 10 mg take  4 each am x 2 days,  2 each  am x 2 days,  1 each am x 2 days and stop  ?Bystolic 10 mg take twice daily until return  ?Take 2 chlortrimeton at bedtime and 2 more 4 hours later  ?Continue symbicort 160 Take 2 puffs first thing in am and then another 2 puffs about 12 hours later.  ?Only use your albuterol as rescue  ?Only use nebulizer if you try proventil first and it fails to work ? ?Care everywhere notes reviewed from neurosurgery from [facility] , recent right frontal VP shunt  ?rec at d/c 01/25/16 rec d/c reglan/ hycodan for cough  ?   ?  ? ?11/03/2019  f/u ov/Lakota Markgraf re: lifelong cough / not vaccinated for covid yet  ?Chief Complaint  ?Patient presents with  ? Follow-up  ?  reports baseline status today; c/o feeling tired and short of breath  ? Dyspnea:  MMRC3 = can't walk 100 yards even at a slow pace at a flat grade s stopping due to sob   ?Cough: mostly dry worse after supper and at hs  ?Sleeping: on bipap/ 2lpm feels sleeps well  ?SABA use: inhaler before shopping  and helps  ?02: 2lpm bedtime only  ?Rec ?For cough > phenergan dm  1 tsp every 4 hours as needed  ?Try albuterol (hfa fist day, neb 2nd and nothing the 3rd) 15 min before an activity that you know would make you short of breath ?Gapentin 100 mg four times daily  ? ?06/25/2021  f/u ov/Dagmar Adcox re: lifelong cough /asthma  maint on symbicort 160 2bid  but not first thing in am (last filled 05/01/21 )  ?Chief Complaint  ?Patient presents with  ? Follow-up  ?  Doing well  ?Dyspnea:  2-3 x per weeks x hour   stair/ bike stops due to heart rate  ?Cough: p stir bubbles x 15 min before symbicort  ?Sleeping: on bipap flat bed with wedge so about 45 degrees ?SABA use: less than prior  ?02: 2lpm hs with bipap,  then with ex sometimes  ?Covid status:   vax x 5  ? ? ?No obvious day to day or daytime variability or assoc excess/ purulent sputum or mucus plugs or hemoptysis or cp or chest tightness, subjective wheeze or overt sinus or hb symptoms.  ? ?Sleeping as above without nocturnal  or early am  exacerbation  of respiratory  c/o's or need for noct saba. Also denies any obvious fluctuation of symptoms with weather or environmental changes or other aggravating or alleviating factors except as outlined above  ? ?No unusual exposure hx  or h/o childhood pna or knowledge of premature birth. ? ?Current Allergies, Complete Past Medical History, Past Surgical History, Family History, and Social History were reviewed in [employer] record. ? ?ROS  The following are not active complaints unless bolded ?Hoarseness, sore throat, dysphagia, dental problems, itching, sneezing,  nasal congestion or discharge of excess mucus or purulent secretions, ear ache,   fever, chills, sweats, unintended wt loss or wt gain, classically pleuritic or exertional cp,  orthopnea pnd or arm/hand swelling  or leg swelling, presyncope, palpitations, abdominal pain, anorexia, nausea, vomiting, diarrhea  or change in bowel habits or change in bladder habits, change in stools or change in urine, dysuria, hematuria,  rash, arthralgias, visual complaints, headache, numbness, weakness or ataxia or problems with walking or coordination,  change in mood or  memory. ?      ? ?Current Meds  ?Medication Sig  ? acetaZOLAMIDE (DIAMOX) 250 MG tablet Take 4 tablets (1,000 mg total) by mouth 3 (three) times daily.  ? albuterol (PROVENTIL) (2.5 MG/3ML) 0.083% nebulizer solution Take 3 mLs (2.5 mg total) by nebulization every 6 (six) hours as needed for wheezing or shortness of breath. USE 1 VIAL VIA NEBULIZER EVERY 4 HOURS AND AS NEEDED FOR WHEEZING OR SHORTNESS OF BREATH Strength: (2.5 MG/3ML) 0.083%  ? albuterol (VENTOLIN HFA) 108 (90 Base) MCG/ACT inhaler TAKE 2 PUFFS BY MOUTH EVERY 6 HOURS AS NEEDED FOR WHEEZE OR SHORTNESS OF BREATH  ? AMBULATORY NON FORMULARY MEDICATION Carpal Tunnel Wrist brace. Rt ? ?Carpal Tunnel Syndrome G56.01 ? ?Disp 1  ? AMBULATORY NON FORMULARY MEDICATION Lace up ankle brace (ASO type) ? ?Ankle sprain  S93.491A ? ?Disp 1  ? amitriptyline (ELAVIL) 100 MG tablet TAKE 1 TABLET BY MOUTH EVERYDAY AT BEDTIME (Patient taking differently: Take 100 mg by mouth at bedtime.)  ? Biotin 5000 MCG TABS Take 5,000 mg by mouth 2

## 2021-06-25 ENCOUNTER — Ambulatory Visit (INDEPENDENT_AMBULATORY_CARE_PROVIDER_SITE_OTHER): Payer: Medicare Other | Admitting: Internal Medicine

## 2021-06-25 ENCOUNTER — Encounter: Payer: Self-pay | Admitting: Internal Medicine

## 2021-06-25 ENCOUNTER — Other Ambulatory Visit: Payer: Self-pay

## 2021-06-25 DIAGNOSIS — J45991 Cough variant asthma: Secondary | ICD-10-CM

## 2021-06-25 DIAGNOSIS — I10 Essential (primary) hypertension: Secondary | ICD-10-CM | POA: Diagnosis not present

## 2021-06-25 NOTE — Patient Instructions (Addendum)
Symbicort 160 Take 2 puffs first thing in am and then another 2 puffs about 12 hours later.  ? ?Work on inhaler technique:  relax and gently blow all the way out then take a nice smooth full deep breath back in, triggering the inhaler at same time you start breathing in.  Hold for up to 5 seconds if you can. Blow out thru nose. Rinse and gargle with water when done.  If mouth or throat bother you at all,  try brushing teeth/gums/tongue with arm and hammer toothpaste/ make a slurry and gargle and spit out.  ? ?Please remember to go to the lab department   for your tests - we will call you with the results when they are available. ?    ?  ?Please schedule a follow up visit in 6 months but call sooner if needed  ? ? ? ? ?

## 2021-06-25 NOTE — Assessment & Plan Note (Addendum)
Onset reported in childhood  ?- PFT's 01/31/2011 FEV1  1.76 (60% ) ratio 68 and no better p B2,  DLCO 70% ?-med review 09/25/2012 > Dulera drug assistance  paperwork given ?   - HFA 90% 11/20/2012 .  ?-Med calendar 12/07/12 , 04/06/2013 , 04/18/2014,, 08/30/2014 , 11/08/2014  ?- PFT's 01/12/2013  1.73 (65%)  Ratio 73 and no better p B2, DLCO 86% ?- trial off singulair 02/08/13 >>>did not stop  ?- 04/05/2014  trial of dulera 100 instead of 200 > worse  ?-04/18/2014 increase Dulera 200  ?- 08/01/2014 p extensive coaching HFA effectiveness =    90% ?- 08/01/2014  Walked RA x 2laps @ 185 ft each stopped due to cough > sob/ no desat moderate pace  ?- spirometry 08/01/2014  FEV1  1.87 (70%) ratio 78  fef 25-75 53%  ?- spirometry 02/10/2015  flattening of top portion of f/v loop only  ?  - 02/10/2015   Walked RA  2 laps @ 185 ft each stopped due to cough > sob/ slow pace   ?-  NO 03/24/2015   =  16 ?- Methacholine challenge 04/04/2015   POS asthma = wheeze/sob but no coughing fits > added qvar 80 2bid 06/29/2015  ?- Allergy profile 11/17/2015 >  Eos 0.1 /  IgE  80 POS RAST  to grass, ragweed, cedar tree    ?- Spirometry 08/31/2015  FEV1 0.75 (28%)  Ratio 68  ?- FENO 11/17/2015  =  8  on symb 160 2bid and qvar 80 2bid  ?- FENO 03/22/2016  = 36 on symb 160/qvar 80 though not able to verify being used as directed  ? ?Body mass index is 36.64 kg/m?.  -  trending up again  ?Lab Results  ?Component Value Date  ? TSH 1.63 06/25/2021  ?  ? ? ?Contributing to doe and risk of GERD ?>>>   reviewed the need and the process to achieve and maintain neg calorie balance > defer f/u primary care including intermittently monitoring thyroid status   ? ?    ? f/u q 30m sooner prn ? ? ?      ?

## 2021-06-25 NOTE — Assessment & Plan Note (Addendum)
Onset reported in childhood  ?- PFT's 01/31/2011 FEV1  1.76 (60% ) ratio 68 and no better p B2,  DLCO 70% ?-med review 09/25/2012 > Dulera drug assistance  paperwork given ?   - HFA 90% 11/20/2012 .  ?-Med calendar 12/07/12 , 04/06/2013 , 04/18/2014,, 08/30/2014 , 11/08/2014  ?- PFT's 01/12/2013  1.73 (65%)  Ratio 73 and no better p B2, DLCO 86% ?- trial off singulair 02/08/13 >>>did not stop  ?- 04/05/2014  trial of dulera 100 instead of 200 > worse  ?-04/18/2014 increase Dulera 200  ?- 08/01/2014 p extensive coaching HFA effectiveness =    90% ?- 08/01/2014  Walked RA x 2laps @ 185 ft each stopped due to cough > sob/ no desat moderate pace  ?- spirometry 08/01/2014  FEV1  1.87 (70%) ratio 78  fef 25-75 53%  ?- spirometry 02/10/2015  flattening of top portion of f/v loop only  ?  - 02/10/2015   Walked RA  2 laps @ 185 ft each stopped due to cough > sob/ slow pace   ?-  NO 03/24/2015   =  16 ?- Methacholine challenge 04/04/2015   POS asthma = wheeze/sob but no coughing fits > added qvar 80 2bid 06/29/2015  ?- Allergy profile 11/17/2015 >  Eos 0.1 /  IgE  80 POS RAST  to grass, ragweed, cedar tree    ?- Spirometry 08/31/2015  FEV1 0.75 (28%)  Ratio 68  ?- FENO 11/17/2015  =  8  on symb 160 2bid and qvar 80 2bid  ?- FENO 03/22/2016  = 36 on symb 160/qvar 80 though not able to verify being used as directed  ? ?Still needing too much saba and ? Adherent to symbicort 160 ? > will do pharmacy check> last filled 05/01/21 so doubt really taking max dose  ? ?- The proper method of use, as well as anticipated side effects, of a metered-dose inhaler were discussed and demonstrated to the patient using teach back method. Advised needs to use symbicort as rec: Take 2 puffs first thing in am and then another 2 puffs about 12 hours later.  ? ?  ?Re saba: ?Re SABA :  I spent extra time with pt today reviewing appropriate use of albuterol for prn use on exertion with the following points: ?1) saba is for relief of sob that does not improve by walking a slower  pace or resting but rather if the pt does not improve after trying this first. ?2) If the pt is convinced, as many are, that saba helps recover from activity faster then it's easy to tell if this is the case by re-challenging : ie stop, take the inhaler, then p 5 minutes try the exact same activity (intensity of workload) that just caused the symptoms and see if they are substantially diminished or not after saba ?3) if there is an activity that reproducibly causes the symptoms, try the saba 15 min before the activity on alternate days  ? ?If in fact the saba really does help, then fine to continue to use it prn but advised may need to look closer at the maintenance regimen being used to achieve better control of airways disease with exertion.  ?

## 2021-06-25 NOTE — Assessment & Plan Note (Addendum)
Lab Results  ?Component Value Date  ? CREATININE 1.08 (H) 05/17/2021  ? CREATININE 1.20 (H) 04/05/2021  ? CREATININE 0.88 03/01/2021  ?  ?bp elevation noted but still only on min doses of minoxidil >  Would verify with pharmacy that's it's being filled and titrate up as needed but defer to Dr Oval Linsey ? ?Each maintenance medication was reviewed in detail including emphasizing most importantly the difference between maintenance and prns and under what circumstances the prns are to be triggered using an action plan format where appropriate. ? ?Total time for H and P, chart review, counseling, reviewing hfa device(s) and generating customized AVS unique to this office visit / same day charting  > 30 min ?

## 2021-06-26 LAB — TSH: TSH: 1.63 u[IU]/mL (ref 0.35–5.50)

## 2021-06-27 ENCOUNTER — Encounter: Payer: Self-pay | Admitting: Internal Medicine

## 2021-06-27 NOTE — Progress Notes (Signed)
Spoke with pt and notified of results per Dr. Wert. Pt verbalized understanding and denied any questions. 

## 2021-07-02 ENCOUNTER — Other Ambulatory Visit: Payer: Self-pay | Admitting: Cardiovascular Disease

## 2021-07-02 NOTE — Telephone Encounter (Signed)
Rx(s) sent to pharmacy electronically.  

## 2021-07-16 ENCOUNTER — Ambulatory Visit (INDEPENDENT_AMBULATORY_CARE_PROVIDER_SITE_OTHER): Payer: Medicare Other | Admitting: Cardiovascular Disease

## 2021-07-16 ENCOUNTER — Encounter (HOSPITAL_BASED_OUTPATIENT_CLINIC_OR_DEPARTMENT_OTHER): Payer: Self-pay | Admitting: Cardiovascular Disease

## 2021-07-16 VITALS — BP 184/92 | HR 91 | Ht 65.0 in | Wt 215.4 lb

## 2021-07-16 DIAGNOSIS — Z5181 Encounter for therapeutic drug level monitoring: Secondary | ICD-10-CM | POA: Diagnosis not present

## 2021-07-16 DIAGNOSIS — I471 Supraventricular tachycardia: Secondary | ICD-10-CM | POA: Diagnosis not present

## 2021-07-16 DIAGNOSIS — I25118 Atherosclerotic heart disease of native coronary artery with other forms of angina pectoris: Secondary | ICD-10-CM

## 2021-07-16 DIAGNOSIS — I10 Essential (primary) hypertension: Secondary | ICD-10-CM

## 2021-07-16 DIAGNOSIS — G4733 Obstructive sleep apnea (adult) (pediatric): Secondary | ICD-10-CM | POA: Diagnosis not present

## 2021-07-16 DIAGNOSIS — E785 Hyperlipidemia, unspecified: Secondary | ICD-10-CM

## 2021-07-16 MED ORDER — DOXAZOSIN MESYLATE 8 MG PO TABS: ORAL_TABLET | ORAL | 3 refills | Status: DC

## 2021-07-16 MED ORDER — CHLORTHALIDONE 25 MG PO TABS: 25.0000 mg | ORAL_TABLET | Freq: Every day | ORAL | 3 refills | Status: DC

## 2021-07-16 NOTE — Assessment & Plan Note (Signed)
She has obstructive coronary disease.  Symptoms are managed well with nebivolol, aspirin, amlodipine, rosuvastatin, Zetia, and ranolazine.  She did not tolerate Imdur due to headaches. ?

## 2021-07-16 NOTE — Patient Instructions (Addendum)
Medication Instructions:  ?STOP HYDROCHLOROTHIAZIDE  ? ?START CHLORTHALIDONE 25 MG DAILY  ? ?START TAKING YOUR DOXAZOSIN 2 TABLETS AT BEDTIME  ? ?*If you need a refill on your cardiac medications before your next appointment, please call your pharmacy* ? ?Lab Work: ?FASTING LP/CMET IN 1 WEEK  ? ?If you have labs (blood work) drawn today and your tests are completely normal, you will receive your results only by: ?MyChart Message (if you have MyChart) OR ?A paper copy in the mail ?If you have any lab test that is abnormal or we need to change your treatment, we will call you to review the results. ? ?Testing/Procedures: ?NONE  ? ?Follow-Up: ?At [facility], you and your health needs are our priority.  As part of our continuing mission to provide you with exceptional heart care, we have created designated Provider Care Teams.  These Care Teams include your primary Cardiologist (physician) and Advanced Practice Providers (APPs -  Physician Assistants and Nurse Practitioners) who all work together to provide you with the care you need, when you need it. ? ?We recommend signing up for the patient portal called "MyChart".  Sign up information is provided on this After Visit Summary.  MyChart is used to connect with patients for Virtual Visits (Telemedicine).  Patients are able to view lab/test results, encounter notes, upcoming appointments, etc.  Non-urgent messages can be sent to your provider as well.   ?To learn more about what you can do with MyChart, go to [email or web address].   ? ?Your next appointment:   ?2 month(s) ? ?The format for your next appointment:   ?In Person ? ?Provider:   ?Skeet Latch, MD, Laurann Montana, NP, or [facility]   ? ?Important Information About Sugar ? ? ? ? ? ?

## 2021-07-16 NOTE — Assessment & Plan Note (Signed)
She will come for fasting lipids/CMP.  LDL goal <70.  Continue rosuvastatin and Zetia.  ?

## 2021-07-16 NOTE — Assessment & Plan Note (Signed)
Continue BiPAP.  

## 2021-07-16 NOTE — Progress Notes (Signed)
? ?Cardiology Office Note:   ? ?Date:  07/16/2021  ? ?ID:  Erica [name], DOB [birth year], MRN [id number] ? ?PCP:  Pcp, No  ?Cardiologist:  Skeet Latch, MD  ?Nephrologist: ? ?Referring MD: [name], MD  ? ?CC: Hypertension ? ?History of Present Illness:   ? ?Erica [name] is a 46 y.o. female with a hx of resistant hypertension, medically managed CAD, chronic diastolic heart failure, breast cancer s/p chemo and XRT, morbid obesity and OSA on BiPAP, here for follow-up. She initially established care in the hypertension clinic 04/10/2020. She reports that her BP has been elevated since she was treated for breast cancer in 2010.  Since then her blood pressure has never been very well have been controlled.  She has been on multiple antihypertensives and her blood pressure remains in the 562Z to 308M systolic.  She struggles with exertional dyspnea that makes it hard for her to walk even short distances.  Echo 03/2018 revealed LVEF 45 to 50% with grade 2 diastolic dysfunction.  There was mild MR.  PASP was 33. ? ?Erica [name] has a history of recurrent syncope.  She had an episode that occurred while wearing an ambulatory monitor and the episode was felt to be due to narcolepsy.  She has sleep apnea but is not tolerant of her CPAP machine.  She now uses her BIPAP machine but struggles with insomnia.  She wakes up hourly.  She underwent left and right heart catheterization on 05/04/2019.  She was found to have 60% distal left circumflex and 80% distal LAD disease.  Given her aspirin allergy Dr. Meda Coffee recommended that she start clopidogrel.  Right atrial pressure was 10, and PA pressure was 26/10.  PCWP 18.  LVEDP was 23 mmHg.  At her first hypertension clinic visit there were concerns about whether she was taking her medication as prescribed.  Discrepancies between what was ordered and what she was actually picking up from the pharmacy.  She continues to report taking all medications as prescribed.  Her blood  pressures continue to run quite elevated.  At her initial assessment doxazosin was added to her regimen.  This does not seem to have helped much.  She followed up with our pharmacist and was started on amlodipine.  She was unable to exercise due to poorly controlled blood pressure. Renal artery Dopplers revealed 1- 59% stenosis bilaterally in 2019.  CT-A of the abdomen showed a 1.3 cm renal cyst but no adrenal adenomas.  There was no evidence of renal artery stenosis. ? ?Erica [name] saw our pharmacist on 12/2019.  She was in a lot of pain at the time.  Given her high number or medications and her pain, her medications were not changed.  She has struggled with LE edema.  LE Doppler was negative for DVT 10/2019.  She was given metolazone to take prior to torsemide.  For the last two months she has been more swollen but it is finally better.  Her weight has decreased from 211 lb to 187 lb.  Erica [name] continues to struggle with shortness of breath. Her edema resolved but there was no improvement in her breathing.  She uses her CPAP every other night.  She notes that there is a recall on her CPAP and she isn't supposed to be using it.  However, she can't sleep well without it. Lately her BP at home has been in the 150-200s.  She notes that when she has pain it is higher.  She  has a lot of joint pain, especially in her feel.   ? ?Erica [name] wanted to reduce her medications.  However her blood pressure was significantly elevated.  We reduced hydralazine to 50 mg 3 times a day and increase doxazosin to 8 mg twice daily in an effort to try and get her off of her 3 times daily medications and improve compliance.  Ranexa was increased due to chest pain.  This seems to have helped her chest pain.  It seems to occur randomly both at rest and with exertion.  Her clonidine was increased to 0.6 mg every 24 hours. She wore an  ambulatory monitor that showed PVCs and up to 8 beats of SVT.  The episodes seem to occur mostly in the early  a.m.  She started going to the PREP program at the [facility]  She notes that her heart rate goes up quickly.  It makes her feel exhausted.  She takes her rest when she needs to and start back exercising.  She has lower extremity edema by the end of the day but no orthopnea or PND.  She did get a new CPAP machine and struggles to use it because of claustrophobia.  However she does use it every night. [facility] continued to have exertional intolerance and chest pain.  She underwent left heart catheterization 09/2020 and her cath revealed 80% distal LAD and 30% LPA B with 50% PL 1 stenoses.  Overall unchanged from prior.  Her blood pressure is very poorly controlled, initially in the low 200s over 110s.  It did improve to 152/78 with hydralazine and verapamil IV.  It was noted that she was extremely anxious and that it was also contributing.  Her angina was thought to be due to microvascular disease and poorly controlled blood pressure.  Her distal LAD lesion was not thought to be favorable for PCI but if her chest pain were intractable could be considered. ? ?Erica [name] was struggling with anxiety and referred to psychiatry. Doxazosin was increased, and plans were made to refer her to the Target BP study at [facility]. She was struggling with volume overload and torsemide was increased.  Her weight was up 10 lb.  She was also given metolazone.  BNP at that time was 41.9. She followed up with Laurann Montana, NP 05/2021 and was not taking doxazosin because she thought it was replaced by diltiazem. Imdur was stopped due to headaches. She was advised to resume doxazosin. Today, she is feeling okay overall. 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 She has been receiving cortisone injections which help, but she has more severe pain at night. At home her blood pressure is usually around 818-299 systolic. She also endorses white coat syndrome. Lately, her breathing has been "on and  off." Some days are better than others. Sometimes she notices LE edema, usually by the afternoons. In the mornings the swelling will have improved. Usually she can tell when she retains fluid because she feels movement in her arms and feels it in her face. Lately, she also complains of recurring water blisters on her LLE. Regarding her diet, she continues to monitor her salt intake. She will rarely have a Pepsi, but she is doing well with avoiding them most of the time. She is also trying to drink tea instead. She denies any palpitations, or chest pain. No lightheadedness, headaches, syncope, orthopnea, or PND. ? ? ?Past Medical History:  ?Diagnosis Date  ?  Allergy   ? Anxiety   ? Apnea, sleep 05/04/2014  ? AHI 31/hr now on CPAP at 12cm H2O  ? Arthritis   ? Asthma   ? Asthma without status asthmaticus 01/19/2016  ? Axillary hidradenitis suppurativa 08/01/2011  ? Blood transfusion without reported diagnosis   ? Breast cancer ([place]) 2010  ? a. locally advanced left breast carcinoma diagnosed in 2010 and treated with neoadjuvant chemotherapy with docetaxel and cyclophosphamide as well as paclitaxel. This was followed by radiation therapy which was completed in 2011.  ? 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 CHF (congestive heart failure) ([place])   ? Chronic diastolic CHF (congestive heart failure) ([place]) 06/23/2013  ? Chronic kidney disease   ? Chronic pain in left foot 06/29/2018  ? Chronic stable angina ([place]) 08/22/2020  ? Cough variant asthma 09/27/2010  ? Followed in Pulmonary clinic/ [facility]/ [place]  Onset reported in childhood  - PFT's 01/31/2011 FEV1  1.76 (60% ) ratio 68 and no better p B2,  DLCO 70% -med review 09/25/2012 > [place] drug assistance  paperwork given    - HFA 90% 11/20/2012 .  -Med calendar 12/07/12 , 04/06/2013 , 04/18/2014,, 08/30/2014 , 11/08/2014  - PFT's 01/12/2013  1.73 (65%)  Ratio 73 and no better p B2, DLCO 86% - trial off   ? 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 Dyspnea on  exertion   ? a. 01/2013 Lexi MV: EF 54%, no ischemia/infarct;  b. 05/2013 Echo: EF 60-65%, no rwma, Gr 2 DD;  b.   ? Essential hypertension 10/09/2014  ? Estimated Creatinine Clearance: 82.4 mL/min (by C-G form

## 2021-07-16 NOTE — Assessment & Plan Note (Addendum)
Blood pressure remains very poorly controlled on multiple agents.  She has had a secondary work-up with no identifiable causes.  We will switch hydrochlorothiazide to chlorthalidone 25 mg daily.  She has been taking her doxazosin at night because it makes her feel poorly.  We will increase the dose to 60 mg nightly given that she has not been taking her morning dose.  Continue hydralazine, minoxidil, nebivolol, Entresto, spironolactone, and torsemide.  She is interested in enrolling in our renal denervation study.  We will have the research team reach out to her to see if she is a candidate.  Continue working on diet and exercise.  Continue limiting sodium.  Continue compliance with BiPAP. ? ?Screening for Secondary Hypertension:   ? ?  07/16/2021  ? 10:34 AM  ?Causes  ?Drugs/Herbals Screened  ?Renovascular HTN Screened  ?   - Comments Negative on CT and renal Dopplers  ?Sleep Apnea Screened  ?   - Comments Continue BiPAP  ?Thyroid Disease Screened  ?Hyperaldosteronism Screened  ?Pheochromocytoma N/A  ?Cushing's Syndrome N/A  ?Hyperparathyroidism Screened  ?Coarctation of the Aorta Screened  ?   - Comments BP symmetric  ?Compliance Screened  ?  ?Relevant Labs/Studies: ? ?  Latest Ref Rng & Units 05/17/2021  ? 11:12 AM 04/05/2021  ?  8:53 AM 03/01/2021  ? 10:48 AM  ?Basic Labs  ?Sodium 134 - 144 mmol/L 146   141   142    ?Potassium 3.5 - 5.2 mmol/L 4.2   4.3   4.4    ?Creatinine 0.57 - 1.00 mg/dL 1.08   1.20   0.88    ?  ? ?  Latest Ref Rng & Units 06/25/2021  ? 11:04 AM 04/30/2019  ? 10:16 AM  ?Thyroid   ?TSH 0.35 - 5.50 uIU/mL 1.63   1.470    ?  ? ?  Latest Ref Rng & Units 04/30/2019  ? 10:16 AM  ?Renin/Aldosterone   ?Aldosterone 0.0 - 30.0 ng/dL 22.8    ?Renin 0.167 - 5.380 ng/mL/hr 1.230    ?Aldos/Renin Ratio 0.0 - 30.0 18.5    ?  ?   ? ?  Latest Ref Rng & Units 09/20/2014  ? 10:40 PM  ?Cortisol  ?Cortisol  ug/dL 4.5    ?  ? ?  10/29/2018  ? 10:00 AM  ?Renovascular   ?Renal Artery [place] Completed Yes  ?  ? ?

## 2021-07-16 NOTE — Assessment & Plan Note (Signed)
Stable on diltiazem and nebivolol. ?

## 2021-07-17 NOTE — Progress Notes (Addendum)
? ?I, Erica [name], LAT, ATC, am serving as scribe for Dr. Lynne Leader. ? ?Erica [name] is a 46 y.o. female who presents to [place] at [facility] today for f/u of R shoulder pain thought to be due to subacromial bursitis.  She was last seen by [name] on 06/20/21 and had a R subacromial steroid injection.  Today, pt reports R shoulder is feeling somewhat better, 50-60% improvement. Pt c/o increased pain at night.  ? ?Diagnostic testing: C-spine XR- 09/04/20 ? ?Pertinent review of systems: No fevers or chills ? ?Relevant historical information: Difficult to control hypertension.  Breast cancer.  History of idiopathic intracranial hypertension with VP shunt in 2017. ? ? ?Exam:  ?BP (!) 166/106   Pulse 82   Ht '5\' 5"'$  (1.651 m)   Wt 216 lb 9.6 oz (98.2 kg)   LMP  (LMP Unknown)   SpO2 99%   BMI 36.04 kg/m?  ?General: Well Developed, well nourished, and in no acute distress.  ? ?MSK: Right shoulder normal.  Normal motion pain with abduction. ? ? ? ?Lab and Radiology Results ? ?Procedure: Real-time Ultrasound Guided Injection of right shoulder glenohumeral joint posterior approach ?Device: Philips Affiniti 50G ?Images permanently stored and available for review in PACS ?Verbal informed consent obtained.  Discussed risks and benefits of procedure. Warned about infection, bleeding, hyperglycemia damage to structures among others. ?Patient expresses understanding and agreement ?Time-out conducted.   ?Noted no overlying erythema, induration, or other signs of local infection.   ?Skin prepped in a sterile fashion.   ?Local anesthesia: Topical Ethyl chloride.   ?With sterile technique and under real time ultrasound guidance: 40 mg of Kenalog and 2 mL of Marcaine injected into shoulder joint. Fluid seen entering the joint capsule.   ?Completed without difficulty   ?Pain immediately resolved suggesting accurate placement of the medication.   ?Advised to call if fevers/chills, erythema, induration,  drainage, or persistent bleeding.   ?Images permanently stored and available for review in the ultrasound unit.  ?Impression: Technically successful ultrasound guided injection. ? ? ? ? ? ? ? ?Assessment and Plan: ?46 y.o. female with right shoulder pain.  Patient had moderate benefit from subacromial injection a month ago.  Plan today for trial of glenohumeral injection seen if we can get more complete benefit.  If this does not work well enough typically the next step would be an MRI.  However she has a VP shunt that were not sure about the MRI compatibility of.  I was able to find the operative notes from [facility] of the VP shunt insertion in October 2017 available in care everywhere however the specifics of the device are not listed in the op note.  We will send medical records request to [facility] to try to get the specifics of the device out of the medical records so that we can know if this VP shunt is MRI conditional or MRI safe etc.  This should help for future imaging planning of the shoulder if needed. ? ?I hope this will not be necessary and that she will feel better but is needs to be prepared for the future. ? ?If MRI is not able to be done CT arthrogram could be done next. ? ? ?Addendum: ?Received medical records from [place]. ?VP shunt detail. ?Insertion date January 25, 2016. ?Valve type: Hakim Inline Siphonguard Unitzed by [employer] Lot number: [id number] ?Cath: Ventricular standard extra-large.  Implanted: [employer]: Lot number [id number] right-sided ? ?Based on my preliminary research  it does look like this shunt system is MRI conditional with 3 Tesla or less.  We should be able to proceed to MRI if needed. ? ?PDMP not reviewed this encounter. ?Orders Placed This Encounter  ?Procedures  ? [place] LIMITED JOINT SPACE STRUCTURES UP RIGHT(NO LINKED CHARGES)  ?  Order Specific Question:   Reason for Exam (SYMPTOM  OR DIAGNOSIS REQUIRED)  ?  Answer:   shoulder pain  ?  Order Specific Question:   Preferred imaging  location?  ?  Answer:   [place]  ? ?No orders of the defined types were placed in this encounter. ? ? ? ?Discussed warning signs or symptoms. Please see discharge instructions. Patient expresses understanding. ? ? ?The above documentation has been reviewed and is accurate and complete Lynne Leader, M.D. ? ? ?

## 2021-07-18 ENCOUNTER — Ambulatory Visit (INDEPENDENT_AMBULATORY_CARE_PROVIDER_SITE_OTHER): Payer: Medicare Other | Admitting: Family Medicine

## 2021-07-18 ENCOUNTER — Ambulatory Visit: Payer: Medicare Other

## 2021-07-18 VITALS — BP 166/106 | HR 82 | Ht 65.0 in | Wt 216.6 lb

## 2021-07-18 DIAGNOSIS — Z982 Presence of cerebrospinal fluid drainage device: Secondary | ICD-10-CM | POA: Diagnosis not present

## 2021-07-18 DIAGNOSIS — M25511 Pain in right shoulder: Secondary | ICD-10-CM | POA: Diagnosis not present

## 2021-07-18 NOTE — Patient Instructions (Addendum)
Thank you for coming in today.  ? ?You received a steroid injection in your right shoulder today. Seek immediate medical attention if the joint becomes red, extremely painful, or is oozing fluid.  ? ?Recheck as needed.  ?

## 2021-07-19 ENCOUNTER — Inpatient Hospital Stay: Payer: Medicare Other | Attending: Oncology

## 2021-07-19 ENCOUNTER — Other Ambulatory Visit: Payer: Self-pay | Admitting: Nurse Practitioner

## 2021-07-19 ENCOUNTER — Other Ambulatory Visit: Payer: Self-pay

## 2021-07-19 ENCOUNTER — Other Ambulatory Visit: Payer: Self-pay | Admitting: Internal Medicine

## 2021-07-19 VITALS — BP 170/96 | HR 81 | Temp 99.0°F | Resp 18

## 2021-07-19 DIAGNOSIS — C50412 Malignant neoplasm of upper-outer quadrant of left female breast: Secondary | ICD-10-CM | POA: Diagnosis present

## 2021-07-19 DIAGNOSIS — Z79818 Long term (current) use of other agents affecting estrogen receptors and estrogen levels: Secondary | ICD-10-CM | POA: Insufficient documentation

## 2021-07-19 DIAGNOSIS — J4540 Moderate persistent asthma, uncomplicated: Secondary | ICD-10-CM

## 2021-07-19 MED ORDER — GOSERELIN ACETATE 3.6 MG ~~LOC~~ IMPL
3.6000 mg | DRUG_IMPLANT | SUBCUTANEOUS | Status: DC
  Administered 2021-07-19: 3.6 mg via SUBCUTANEOUS
  Filled 2021-07-19: qty 3.6

## 2021-07-23 ENCOUNTER — Other Ambulatory Visit: Payer: Self-pay | Admitting: Internal Medicine

## 2021-07-23 ENCOUNTER — Encounter: Payer: Medicare Other | Admitting: *Deleted

## 2021-07-23 DIAGNOSIS — Z006 Encounter for examination for normal comparison and control in clinical research program: Secondary | ICD-10-CM

## 2021-07-23 NOTE — Research (Addendum)
RADIANCE CAP TRIAL - [place] ? ?Patient ID:  [place]- [id number]                SCREENING - Visit 0 ?   ?Visit Date: 24/APR/2023 ? ?Screening Visit Information       ? ?Informed Consent Date;   _24-APR-2023____   dd/mmm/yyyy     ? ?Protocol Version :    '[]'  A    '[x]'   B  '[]'   C ? ?ICF Version Date:   16/AUG/2022___  dd/mmm/yyyy ?ICF Version:     '[]'  A   '[x]'  B   '[]'   C ? ?Was this subject previously consented?  '[]'   Yes   '[x]'   No ? ?Initial Subject ID  _MRN [id number]___ ? ?Medical History ? ?Section 1 Respiratory Health History: ? ?Does the subject have a history of chronic obstructive pulmonary  ?    Disease (COPD)?    '[]'  Yes  '[x]'   No ? ?Does the subject have a history of Sleep Apnea? '[x]'   Yes  '[]'  No ?    Sleep Apnea Type:  '[x]'  Obstructive    '[]'  Central   '[]'   Mixed  '[]'  Unknown    ? ?Does the subject use CPAP regularly?  '[x]'   Yes  '[]'   No   ?Section 2:  Endocrine Health History ? ?Does the subject have a history of Diabetes?  '[]'   Yes  '[x]'  No ?   Diabetes Type:   '[]'  Type I  '[]'   Type II  '[]'   Unknown   n/a ? ?Is medication taken for diabetes?  '[]'  Yes  '[x]'  No ?   (If Yes, please update medication log) ?  ?Section 3: Renal Health History ? ?Does the subject have a history of chronic kidney disease? '[]'  Yes '[x]'  No ?   Most recent eGFR Value: _64____    mL/min/1.73M2 ?   Sample Collection Date; _16/FEB/2023____  dd/mmm/yyyy ? ?Does the subject have a history of polycystic kidney disease?  '[]'  Yes '[x]'  No ? ?Section 4: Vascular Health History ? ?Does the subject have a history of Cerebrovascular events?  '[]'  Yes  '[x]'  No ?    If yes, what type of event?  '[]'  TIA   '[]'  Stroke  '[]'  Other, specify? ________ ? ?Section 5:  Cancer History ? ?Does the subject have a history of cancer?  '[x]'  Yes  '[]'  No ?   If yes, what type of cancer?  _breast__(left)____ ?   If yes, estimated date of diagnosis?_31/MAR/2010___ dd/mmm/yyyy ? ?Is the subject currently undergoing treatment for cancer?  '[x]'   Yes  '[]'   No ?   ?Section 6: Other ? ?Does the subject have  any active implantable medical devices? '[x]'   None ?'[]'  Neuro Stimulator '[]'  Spinal Stimulator  '[]'   Baroflex Stimulator  '[]'  Other   ? ? 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 ?Cardiovascular History  ? ?Has the subject had any past or present cardiovascular conditions?  '[x]'  Yes '[]'  No ?   (Except Hypertensive History) ? ?Does the subject have a history of myocardial infarction (MI)?  '[]'  Yes  '[x]'  No ?   If Yes, estimated date of most recent myocardial infarction (MI): _______ dd/mmm/yyyy ? ?Does the subject have a history of atrial arrhythmias?  '[]'  Yes  '[x]'  No ?     Check all that apply:   '[]'  Atrial Tachycardia   '[]'   Atrial Flutter   '[]'   Atrial Fibrillation ?                          '[]'  Other, Specify ____________________ ? ?     If atrial fibrillation was selected, how is the AF classified?  '[x]'   N/A ?         '[]'  Paroxysmal    '[]'  Persistent   '[]'  Permanent    ? ?Date of most recent documented episode of atrial arrhythmia?  _N/A______ dd/mmm/yyyy ? ?Does the subject have a history of atrial ablation to treat arrhythmia? '[]'  Yes '[x]'  No ?     If yes, date of most recent atrial ablation: _______  dd/mmm/yyyy ? ?Does the subject have a history of ventricular arrhythmias?  '[]'  Yes  '[x]'  No ?     If yes, date of most recent ventricular arrhythmias: ________   dd/mmm/yyyy ? ?Does the subject have a history of ventricular ablations to treat an arrhythmia?  '[]'  Yes '[x]'  No ?    If Yes, date of most recent ventricular ablation: ________ dd/mmm/yyyy ? ?Does the subject have a history of Heart Failure?  '[x]'  Yes  '[]'  No ? ?Has the subject been hospitalized for Heart Failure?  '[]'  Yes '[x]'  No ?    If yes, estimated date of most recent hospitalization: ________ dd/mmm/yyyy ? ?NYHA Class:  '[]'  Class I  '[]'  Class II  '[]'  Class III  '[]'  ClassI V  '[x]'  Unknown ?   NYHA Classification date:  _25/MAR/2015_____  dd/mmm/yyyy ? ?Does the subject have a history of cardiac arrest?  '[]'  Yes '[x]'  No ?   If Yes, estimated date of the most recent episode of cardiac arrest: _____ dd/mmm/yyyy ? ?Does the subject have an implanted cardioverter defibrillator (ICD)? '[]'  Yes '[x]'  No ?  If yes, Estimated date of most recent implanted ICD:  _______  dd/mmm/yyyy ? ?Does the subject have a history of bradycardia:    '[]'  Yes  '[x]'  No ? ?Does the subject have an implanted pacemaker?  '[]'  Yes  '[x]'  No ? ?Does the subject have a history of hyperlipidemia? '[x]'  Yes '[]'  No ? ?Does the subject have a history of documented episodes of angina? '[x]'  Yes '[]'  No ?    If Yes, estimated date of the most recent episode of angina:  _27-JUL-2022___ dd/mmm/yyyy ? ?Does the subject have a history of coronary artery disease (CAD)? '[x]'  Yes '[]'  No ? ?Does the subject have a history of revascularization?  '[x]'  Yes '[]'  No ?   Type of revascularization?   '[]'  Stent  '[]'  CABG Other, Specify: _none d/t blockage is too distal to treat with angioplasty or stenting____ ?   Estimated date of the most recent vascularization procedure: _27-JUL-2022______ dd/mmm/yyyy ? ?Does the subject have a history of cardiac valve repair or replacement?   '[]'  Yes '[x]'  No ?   ?* continue Medical History using RECORSCREEN2   ? ?RADIANCE CAP TRIAL - [place] ? ?Patient ID:  [place]-  [id number]  -  [id number]                  SCREENING - Visit 0   PART 2 ?   ?Visit Date: 23-Jul-2021 ? ?Hypertension History:  ? ?Does the Subject have a history of hypertension? '[x]'  Yes '[]'  No ?  What was the date of diagnosis of hypertension: _18/JUN/2013 ?  ?Hypertension Category:  '[]'  Controlled with medication  ?    '[x]'   Uncontrolled with medication ?    '[]'  Uncontrolled with no medication ? ?Does the subject have documented Masked Hypertension?  (Subject's office ?  BP readings are substantially lower than home BP readings under ?  Normal conditions.)    '[]'  Yes   '[x]'  No ? ?Does the subject have white coat hypertension? (Subject's feeling of ?  Anxiety in medical office results in high BP reading)    '[x]'   Yes '[]'  No ? ?Has the subject ever been hospitalized for hypertensive crisis? '[]'  Yes '[x]'  No ?   If yes, what was the date of the most recent hospitalization for ?         Hypertensive crisis?   ________ dd/mmm/yyyy ? ?Does the subject have a history of a prior Renal Denervation procedure? '[]'  Yes '[x]'  No ?    If Yes,  Renal Denervation System used to treat the Subject:____________ ?                Date of Renal Denervation treatment:________  dd/mmm/yyyy ? ?Does the subject have a history of primary pulmonary hypertension? '[]'  Yes '[x]'  No ? ? ?Physical Examination:  ? ?Was the Physical examination performed?  '[x]'  Yes  '[]'   No ?   If no, reason not done: ____________ ? ?Time of collection:   10: 45   ? ?Weight:  217  '[]'  kg '[x]'  lb        Height: _65_ '[]'  cm '[x]'  Inches ? ?Abdominal Circumference measurement:  __110__  cm ? ?Right Brachial circumference measurement: _35__ cm ? ?Left Brachial circumference measurement: _N/A  lymphedema Left breast surgery for Cancer ?Heart Rate:  __82___  bpm ? ? ?Demographics: All information will be found within the subject's Epic EMR  ? ?Office Blood Pressure Measurements: Detailed instructions can be found in the  ?  Manual of Operations binder ? ?'[x]'   Initial Right Arm BP Measurement: _200_/_111_  mmHg ?'[]'   Initial Left Arm BP Measurement: ____/______ mmHg ?'[]'   Not Done '[]'  Right /  '[x]'  Left  and reason: __lymphedema, no BPs d/t left breast cancer _____ ? ?*Arm chosen and used for blood pressure measurement for the duration of the study:  ?     '[x]'    Right Arm       '[]'    Left Arm  ? ?   Systolic BP          (mmHg) Diastolic BP    (mmHg) Pulse              (bpm)       ?  1. First-seated  BP          176                 103          81  ?  2. Second-seated BP          197             109        79   ?  3. Third-Seated BP          199              105         76  ?  4. Fourth-               STANDING  BP          180  102          80  ? ?Per [facility];  derived from above  data ?Avg Seated Systolic BP:  324.4  ?Avg Seated Diastolic BP: 010.2 ?Avg. Seated Pulse Rate: 77.5 ? ? ?* Continue Screening Eligibility- Visit O-  Inclusion / Exclusion use RECORSCREEN3 ? ?INCLUSION CRITE

## 2021-07-23 NOTE — Research (Addendum)
[place] [place] [facility] Informed Consent  ? ?Subject Name: Erica [name] ? ?Subject met inclusion and exclusion criteria.  The informed consent form, study requirements and expectations were reviewed with the subject and questions and concerns were addressed prior to the signing of the consent form.  The subject verbalized understanding of the trial requirements.  The subject agreed to participate in the [facility] CAP trial and signed the informed consent at 10:35am on 23-Jul-2021.  The informed consent was obtained prior to performance of any protocol-specific procedures for the subject.  A copy of the signed informed consent was given to the subject and a copy was placed in the subject's medical record.  ? ?[name], BSN, CVRN-BC  ? ?Clinical Cardiovascular Nurse ?[facility]  ?[place] ? [phone]  ?  ?

## 2021-08-02 ENCOUNTER — Encounter (HOSPITAL_BASED_OUTPATIENT_CLINIC_OR_DEPARTMENT_OTHER): Payer: Self-pay | Admitting: *Deleted

## 2021-08-16 ENCOUNTER — Other Ambulatory Visit: Payer: Self-pay

## 2021-08-16 ENCOUNTER — Inpatient Hospital Stay: Payer: Medicare Other | Attending: Hematology and Oncology

## 2021-08-16 VITALS — BP 190/96 | HR 68 | Temp 98.7°F | Resp 18

## 2021-08-16 DIAGNOSIS — C50412 Malignant neoplasm of upper-outer quadrant of left female breast: Secondary | ICD-10-CM | POA: Insufficient documentation

## 2021-08-16 DIAGNOSIS — Z79818 Long term (current) use of other agents affecting estrogen receptors and estrogen levels: Secondary | ICD-10-CM | POA: Insufficient documentation

## 2021-08-16 MED ORDER — GOSERELIN ACETATE 3.6 MG ~~LOC~~ IMPL
3.6000 mg | DRUG_IMPLANT | SUBCUTANEOUS | Status: DC
  Administered 2021-08-16: 3.6 mg via SUBCUTANEOUS
  Filled 2021-08-16: qty 3.6

## 2021-08-18 ENCOUNTER — Other Ambulatory Visit: Payer: Self-pay | Admitting: Family

## 2021-08-18 DIAGNOSIS — G932 Benign intracranial hypertension: Secondary | ICD-10-CM

## 2021-08-18 DIAGNOSIS — I10 Essential (primary) hypertension: Secondary | ICD-10-CM

## 2021-08-20 NOTE — Telephone Encounter (Signed)
Rx(s) sent to pharmacy electronically.  

## 2021-08-21 NOTE — Research (Addendum)
[facility] CAP TRIAL - [facility]  Subject has screen failed due to medications.    Patient ID:  [place]-      -                       BASELINE VISIT 1  Visit Date:   08/21/2021  Visit Type:   '[x]'$  In Clinic    '[]'$   Remote Visit  PHYSICAL EXAMINATION  Was the physical examination performed?          '[x]'$   Yes     '[]'$   No        If no, give reason:    Time of Collection:     09:08  HH:mm   Weight:       219  '[]'$  kg   '[x]'$  Lbs   Abdominal circumference measurement    _108_  cm   Heart Rate    87 bpm   Office Blood Pressure Measurements        Arm    Systolic BP       (mmHg)      Diastolic BP          (mmHg)        Pulse       (bpm)   1. First-Seated BP  R 195 107 87   2. Second-Seated BP R 199 109 88   3. Third-Seated BP R 201 111 87   4. Fourth-Standing BP R 202 116 86   Ambulatory Blood Pressure   Has the patient been provided with an Ambulatory Blood Pressure monitor and shown how to use it?         '[x]'$   Yes         '[]'$   No   Was the first ABP recording valid?               (At least 21 valid daytime measurements and 7 nighttime)         '[x]'$   Yes         '[]'$   No            If NO;    '[]'$  BP did not meet criteria-patient   screen-failed    '[x]'$  BP did not meet criteria - patient remains in study Lobbyist approval)    '[]'$  Insufficient data collected    '[]'$  Other, specify:______________       If insufficient data collected, is the patient willing to repeat the recording?    '[]'$    Yes    '[x]'$    No    Date when the repeat recording was started     ___/____/_____     dd/mmm/yyyy    Is the patient's final average daytime ambulatory blood pressure                            >/= 135/85 mmHg?           '[]'$   Yes     '[x]'$   No   *Enter ABPM data from the [facility] report into the [facility]. If repeat ABPM is approved, enter a repeat ABPM and Urine for Drug Metabolite as an Unscheduled Visit within the same timeframe.   Home Blood Pressure   Were the Home Blood Pressure Measurements  collect?       '[x]'$   Yes      '[]'$   No  If No, specify the reason not done        *Please enter the 7-day home blood pressure measurements from the diary into the [facility]        High BP Action   Did the subject meet the safety escape criteria as defined in protocol?  Safety Escape Criteria defined as: Home BP >/=440 mmHg systolic or >/= 347 mmHg diastolic at Baseline, associated with clinical events considered to be related to persistent or elevated hypertension AND verified by any of the following additional BP values will result in the subject being excluded:      * Average office BP >/= 425 mmHg systolic or >/= 956 mmHg diastolic.        * Daytime ABP >/= 387 mmHg systolic or >/= 564 mmHg diastolic.              '[]'$   Yes         '[x]'$    No    If yes, please add an AE and exclude the subject    Witnessed Pill Intake    Was the Subject witnessed taking their antihypertensive medication at this visit?       '[x]'$  Yes    '[]'$   No       If No, specify the reason:__________________     SF-QOL Questionnaire   Was the SF-12 questionnaire completed?        '[x]'$   Yes     '[]'$   No     If yes, specify date     23/May/2023      dd    mmm   yyyy   If no, specify reason:    Urine for Drug Metabolites   Were two (2) 10 mL urine samples for drug metabolites collected?      '[x]'$   Yes  '[]'$   No     Urine sample collection date    23/May/2023      dd    mmm   yyyy    If No, specify reason:     Subject Continuation     Were any adverse events reported at this visit?            If yes, please complete an Adverse Event Log CRF     '[]'$  Yes     '[x]'$   No     Has the patient been hospitalized or seen at any clinic or doctor's off since the previous visit?     '[]'$  Yes     '[x]'$   No   Were any deviations observed at this visit?   If Yes, please complete a Deviation Log CRF     '[]'$  Yes     '[x]'$   No   Were there any changes to the patient's medications noted during this visit?  If yes, please add to  the Medication Log CRF     '[]'$  Yes     '[x]'$   No    Will the subject continue to the next visit?     '[]'$  Yes     '[x]'$   No        *If the subject is not continuing in the study, please complete the End of Study Form   Pre-Procedure Testing Reminder    Screening Renal CTA or MRI     '[]'$            * If testing is done at the Baseline visit, please complete that section in the Pre-procedure worksheet.  12-Lead ECG     '[]'$      Blood for Chemistry     '[]'$      Urine for Chemistry     '[]'$       Negative Pregnancy test      '[]'$    N/A '[]'$

## 2021-09-13 ENCOUNTER — Other Ambulatory Visit: Payer: Self-pay

## 2021-09-13 ENCOUNTER — Inpatient Hospital Stay: Payer: Medicare Other | Attending: Oncology

## 2021-09-13 VITALS — BP 202/94 | HR 72 | Temp 98.8°F | Resp 18

## 2021-09-13 DIAGNOSIS — C50412 Malignant neoplasm of upper-outer quadrant of left female breast: Secondary | ICD-10-CM | POA: Diagnosis present

## 2021-09-13 DIAGNOSIS — Z79818 Long term (current) use of other agents affecting estrogen receptors and estrogen levels: Secondary | ICD-10-CM | POA: Diagnosis present

## 2021-09-13 MED ORDER — GOSERELIN ACETATE 3.6 MG ~~LOC~~ IMPL
3.6000 mg | DRUG_IMPLANT | SUBCUTANEOUS | Status: DC
  Administered 2021-09-13: 3.6 mg via SUBCUTANEOUS
  Filled 2021-09-13: qty 3.6

## 2021-09-16 NOTE — Progress Notes (Unsigned)
Office Visit    Patient Name: Erica [name] Date of Encounter: 09/17/2021  PCP:  Kathyrn Lass   [place]  Cardiologist:  Skeet Latch, MD  Advanced Practice Provider:  No care team member to display Electrophysiologist:  None    Chief Complaint    Erica [name] is a 46 y.o. female with a hx of resistant hypertension, chronic diastolic heart failure, breast cancer s/p chemo and XRT, morbid obesity, OSA intolerant of CPAP presents today for hypertension, heart failure follow up.   Past Medical History    Past Medical History:  Diagnosis Date   Allergy    Anxiety    Apnea, sleep 05/04/2014   AHI 31/hr now on CPAP at 12cm H2O   Arthritis    Asthma    Asthma without status asthmaticus 01/19/2016   Axillary hidradenitis suppurativa 08/01/2011   Blood transfusion without reported diagnosis    Breast cancer ([place]) 2010   a. locally advanced left breast carcinoma diagnosed in 2010 and treated with neoadjuvant chemotherapy with docetaxel and cyclophosphamide as well as paclitaxel. This was followed by radiation therapy which was completed in 2011.   Chemotherapy-induced peripheral neuropathy ([place]) 12/09/2016   CHF (congestive heart failure) (HCC)    Chronic diastolic CHF (congestive heart failure) ([place]) 06/23/2013   Chronic kidney disease    Chronic pain in left foot 06/29/2018   Chronic stable angina ([place]) 08/22/2020   Cough variant asthma 09/27/2010   Followed in Pulmonary clinic/ [facility]/ [place]  Onset reported in childhood  - PFT's 01/31/2011 FEV1  1.76 (60% ) ratio 68 and no better p B2,  DLCO 70% -med review 09/25/2012 > [place] drug assistance  paperwork given    - HFA 90% 11/20/2012 .  -Med calendar 12/07/12 , 04/06/2013 , 04/18/2014,, 08/30/2014 , 11/08/2014  - PFT's 01/12/2013  1.73 (65%)  Ratio 73 and no better p B2, DLCO 86% - trial off    Depression with somatization 06/15/2015   Dyspnea on exertion    a. 01/2013 Lexi MV: EF 54%, no ischemia/infarct;  b.  05/2013 Echo: EF 60-65%, no rwma, Gr 2 DD;  b.    Essential hypertension 10/09/2014   Estimated Creatinine Clearance: 82.4 mL/min (by C-G formula based on SCr of 0.98 mg/dL).   Fatty liver    Gastroparesis    GERD (gastroesophageal reflux disease)    Hyperglycemia 04/13/2011   Hypertension    Hypertensive heart disease 09/26/2015   Hypertriglyceridemia without hypercholesterolemia 06/15/2015   Framingham risk score is 1%    Idiopathic intracranial hypertension 10/21/2014   Impaired glucose tolerance 04/13/2011   Insomnia secondary to anxiety 04/05/2015   Lymphadenitis, chronic    restricted LEFT extremity   Malignant neoplasm of upper-outer quadrant of left breast in female, estrogen receptor negative ([place]) 01/07/2013   Stage III, Invasive Ductal, s/p partial Left Mastectomy/Lumpectomy (baseball sized) with Lymph Node dissection, had Chemo and RadRx     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[place]) 09/27/2010   OSA (obstructive sleep apnea) 05/04/2014   AHI 31/hr now on CPAP at 12cm H2O   Oxygen deficiency    Uses 02 in CPAP   Persistent proteinuria 03/31/2018   Personal history of chemotherapy 2010   Personal history of radiation therapy 2010   Sleep apnea    SVT (supraventricular tachycardia) ([place]) 08/22/2020   Tibialis posterior tendinitis, left 07/15/2018   Injected in March 10, 2019   Upper airway cough syndrome 10/09/2014   Onset around 2012 with  assoc pseudowheeze MRI 05/18/15 > Paranasal sinuses are clear - 11/17/2015 rec increase h1 to 2 hs to help with noct cough  - Allergy profile 11/17/2015 >  Eos 0.1 /  IgE  80 POS RAST  to grass, ragweed, cedar tree  - d/c acei 03/06/2018  - flare end of Jan 2020 in setting of ? Uri/ poorly controlled GERD - flare mid Oct 2020 with uri / assoc secondary gerd   Vitamin D deficiency disease 03/31/2018   Past Surgical History:  Procedure Laterality Date   BRAIN SURGERY     [place]   BREAST LUMPECTOMY Left 09/2008   CARPAL TUNNEL RELEASE Left 2011   LYMPH NODE DISSECTION  Left 2010   breast; 2 wks after breast lumpectomy   RIGHT/LEFT HEART CATH AND CORONARY ANGIOGRAPHY N/A 05/04/2019   Procedure: RIGHT/LEFT HEART CATH AND CORONARY ANGIOGRAPHY;  Surgeon: Jolaine Artist, MD;  Location: [facility];  Service: Cardiovascular;  Laterality: N/A;  Renal injection preformed    RIGHT/LEFT HEART CATH AND CORONARY ANGIOGRAPHY N/A 10/25/2020   Procedure: RIGHT/LEFT HEART CATH AND CORONARY ANGIOGRAPHY;  Surgeon: Leonie Man, MD;  Location: [facility];  Service: Cardiovascular;  Laterality: N/A;   TENDON RELEASE Right 2012   "hand"   TUBAL LIGATION  05/11/2011   Procedure: POST PARTUM TUBAL LIGATION;  Surgeon: Emeterio Reeve, MD;  Location: [facility];  Service: Gynecology;  Laterality: Bilateral;  Induced for HTN    Allergies  Allergies  Allergen Reactions   Aspirin Shortness Of Breath and Palpitations    Pt can take ibuprofen without reaction   Doxycycline Anaphylaxis   Kiwi Extract Itching and Swelling    Lip swelling   Metoprolol Hives and Itching   Penicillins Shortness Of Breath and Palpitations    Has patient had a PCN reaction causing immediate rash, facial/tongue/throat swelling, SOB or lightheadedness with hypotension: Yes Has patient had a PCN reaction causing severe rash involving mucus membranes or skin necrosis: Yes Has patient had a PCN reaction that required hospitalization Yes Has patient had a PCN reaction occurring within the last 10 years: No If all of the above answers are "NO", then may proceed with Cephalosporin use.   Strawberry Extract Hives   Sulfamethoxazole-Trimethoprim Anaphylaxis    Facial, throat and tongue swelling with difficulty swallowing.    History of Present Illness    Erica [name] is a 46 y.o. female with a hx of resistant hypertension, chronic diastolic heart failure, breast cancer s/p chemo and XRT, morbid obesity, OSA intolerant of CPAP last seen 07/16/21  She has history of hypertension dating back to  treatment of breast cancer in 2010. She has established with Dr. Oval Linsey in the hypertension clinic. Previous echocardiogram 03/2018 LVEF 16-10%, grade 2 diastolic dysfunction, mild MR, PASP 33. Renal artery duppler 2019 with bilateral 1-59% stenosis. CT-A abdomen with 1.3 cm renal cyst but no adrenal adenomas. No evidence of renal artery stenosis.   She has a hx of recurrent syncope. 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 She has sleep apnea but is intolerant of CPAP and now uses BIPAP. She had [facility] 05/2019 with finding of 60% distal LCx and 80% distal LAD disease. She was started on Clopidogrel due to allergy to aspirin. LE duplex 10/2019 for lower extremity edema was negative for DVT.   There have been concerns previously about whether she is taking medications as prescribed given discrepancies between what was ordered versus  picked up from the pharmacy. She reports taking medications as prescribed.   Blood pressure has been difficult to control. Doxazosin ordered at initial clinic visit. Amlodipine subsequently added. She has been given Metolazone in addition to Torsemide due to edema. She has previously been interested in decreasing medications despite markedly elevated blood pressure. Her hydralazine was reduced to '50mg'$  TID and doxazosin increased to '8mg'$  twice daily to try to reduce TID dosing. Ranexa increased due to chest pain with improvement. Clonidine increased to 0.'6mg'$  QD. Monitor 03/23/20  showing PVC with up to 8 beats of SVT.   Seen 08/22/20 by Dr. Oval Linsey. Her BP at home was 150s-160s in setting of Prednisone. She was bradycardia 50s but asymptomatic. She was participating in [employer] program at [facility]. Her Hydralazine was increased to '100mg'$  TID. Amlodipine, Clonidine, Diltiazem, Doxazosin, HCTZ, Minoxidil, Spironolactone, Nebivolol, and Entresto were continued.   Seen in follow up 10/23/20. She was scheduled for [facility] which was performed 10/25/20  with distal LAD 80% stenosed, LPAV lesion 30% stenosed with 50% stenosed side branch in first LPL. Hyperdynamic LV systolic function was noted. Hemodynamic findings were consistent with mild secondary pulmonary hypertension. It was overall unchanged from previous. Angina was suspected due to accelerated hypertension, increased wall stress, and microvascular ischemia. Distal LAD not favorable for PCI but could be considered for intractable pain. BP markedly elevated 200s/110s which improved after medication to 152/78.  She has been referred to psychiatry due to anxiety. When seen 03/01/21 she was volume overloaded and Torsemide increased and provided Metolazone. At time of clinic visit 03/16/21 volume status was improved and echo ordered. Echo 04/05/21 normal LVEF 55-60%, mild LVH, normal diastolic parameters, mildly elevated PASP, bilateral atria moderately dilated, mild MR. Based on follow up labs her Metolazone was reduced from '5mg'$  to 2.'5mg'$  weekly.  Minoxidil was increased to 5 mg twice daily.  At follow up 05/2021 BP 150s/70s at home. She was not taking Doxazosin nor Minoxidil and these were resumed. At follow up 07/16/21 with Dr. Oval Linsey BP remained poorly controlled. HCTz transitioned to Chlorthalidone. Doxasozine dose increased. She was enrolled in renal denervation study.    Presents today for follow up independently.  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  Since Thursday she has been feeling more tired.  Exertional dyspnea stable baseline.  No chest pain, edema, orthopnea, PND.  Note she wore a 24-hour blood pressure monitor for part of [place] trial actually had hypotensive readings.  However has never had hypotensive readings on her home cuff nor in clinic visits.  Has repeat 24-hour blood pressure monitor later this week.  Of note she did have to reposition the cath at home due to comfort and I wonder if this is where her erroneous values came from.  EKGs/Labs/Other  Studies Reviewed:   The following studies were reviewed today:  Echo 04/05/21  1. Left ventricular ejection fraction, by estimation, is 55 to 60%. The  left ventricle has normal function. The left ventricle has no regional  wall motion abnormalities. There is mild left ventricular hypertrophy.  Left ventricular diastolic parameters  were normal. The average left ventricular global longitudinal strain is  -15.0 %. The global longitudinal strain is abnormal.   2. Right ventricular systolic function is normal. The right ventricular  size is mildly enlarged. There is mildly elevated pulmonary artery  systolic pressure. The estimated right ventricular systolic pressure is  03.5 mmHg.   3. Left atrial size was moderately dilated.  4. Right atrial size was moderately dilated.   5. The mitral valve is normal in structure. Mild mitral valve  regurgitation. No evidence of mitral stenosis.   6. The aortic valve is normal in structure. Aortic valve regurgitation is  not visualized. No aortic stenosis is present.   7. The inferior vena cava is normal in size with greater than 50%  respiratory variability, suggesting right atrial pressure of 3 mmHg.   Comparison(s): EF 45%, moderate LVH, GLS -13.1%.   __________________________________________________   [facility] 10/25/20   Dist LAD lesion is 80% stenosed.   LPAV lesion is 30% stenosed with 50% stenosed side branch in 1st LPL.   There is hyperdynamic left ventricular systolic function.  The left ventricular ejection fraction is greater than 65% by visual estimate.   Hemodynamic findings consistent with mild (secondary) pulmonary hypertension - LVEDP ~25 mmHg, PCWP ~19-20 mmHg   Normal Cardiac Output / Index (7.91 / 3.94)   POST-OPERATIVE DIAGNOSIS:              Stable single-vessel CAD with apical LAD 80% stenosis-no change from previous cath.  Otherwise very tortuous coronary arteries with no significant stenoses.            Mild (likely  secondary) Pulmonary Hypertension with PA mean pressure of 28 mmHg (PAP 39/15 mmHg), and PCWP of 19 mmHg with LVEDP of 25 mmHg.            Significant systolic hypertension with initial pressures in the 200s over 110s, however after medication 152/78 mmHg with MAP of 109 mmHg; LVP/EDP 155/13 mmHg - 25 mmHg.            Ao sat 99%, PA sat 81%.  Fick Cardiac Output-Index: 7.91-3.84 (normal)            Suspect angina is due to accelerated hypertension, increased wall stress and microvascular ischemia.  Distal LAD lesion is not favorable for PCI (however if pain is intractable, could be considered)   __________________________________________________  Echo 03/12/18: Study Conclusions  - Left ventricle: The cavity size was normal. Wall thickness was    increased in a pattern of moderate LVH. Systolic function was    mildly reduced. The estimated ejection fraction was in the range    of 45% to 50%. Wall motion was normal; there were no regional    wall motion abnormalities. Features are consistent with a    pseudonormal left ventricular filling pattern, with concomitant    abnormal relaxation and increased filling pressure (grade 2    diastolic dysfunction).  - Mitral valve: There was mild regurgitation.  - Pulmonary arteries: Systolic pressure was mildly increased. PA    peak pressure: 33 mm Hg (S).   __________________________________________________  [facility] Myoview 04/20/18:   The left ventricular ejection fraction is moderately decreased (30-44%). Nuclear stress EF: 41%. There was no ST segment deviation noted during stress. This is an intermediate risk study.   Abnormal, intermediate risk stress nuclear study with no ischemia or infarction; EF 41 with global hypokinesis and mild LVE; study intermediate risk due to reduced LV function.  __________________________________________________   Renal artery Doppler 10/29/18: Right: 1-59% stenosis of the right renal artery. Cyst(s) noted.   Left:  1-59% stenosis of the left renal artery.     __________________________________________________  LHC/RHC 05/04/19: Dist LAD-1 lesion is 30% stenosed. Dist LAD-2 lesion is 80% stenosed. Dist Cx lesion is 60% stenosed.   Findings:   Ao = 173/104 (134) LV =  181/25 RA =  10 RV =  41/13 PA =  42/10 (26) PCW = 18 Fick cardiac output/index = 5.8/2.9 PVR = 1.4 wu Ao sat = 98% PA sat = 73%, 73%   Assessment: 1. 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 LVEF 55-60% 3. Mildly elevated filling pressures with mild pulmonary venous HTN m4. Widely patent bilateral renal arteries. No evidence of AAA 5. Severe systemic HTN  __________________________________________________  Monitor 03/23/2020: 3 Day Zio Monitor   Quality: Fair.  Baseline artifact. Predominant rhythm: sinus Average heart rate: 84 bpm Max heart rate: 140 bpm Min heart rate: 57 bpm Pauses >2.5 seconds: none Occasional PVCs and ventricular bigeminy Two runs of SVT up to 8 beats.  Rate 224 bpm.   EKG:  No EKG today.  Recent Labs: 12/07/2020: ALT 33; Hemoglobin 13.8; Platelets 218 05/17/2021: BNP 67.4; BUN 13; Creatinine, Ser 1.08; Potassium 4.2; Sodium 146 06/25/2021: TSH 1.63  Recent Lipid Panel    Component Value Date/Time   CHOL 199 08/22/2020 0912   TRIG 114 08/22/2020 0912   HDL 44 08/22/2020 0912   CHOLHDL 4.5 (H) 08/22/2020 0912   CHOLHDL 4 10/01/2018 1115   VLDL 22.6 10/01/2018 1115   LDLCALC 134 (H) 08/22/2020 0912   LDLDIRECT 152.0 03/30/2018 1604   Home Medications   Current Meds  Medication Sig   acetaZOLAMIDE (DIAMOX) 250 MG tablet Take 4 tablets (1,000 mg total) by mouth 3 (three) times daily.   albuterol (PROVENTIL) (2.5 MG/3ML) 0.083% nebulizer solution USE 1 VIAL VIA NEBULIZER EVERY 4 HOURS AND AS NEEDED FOR WHEEZING OR SHORTNESS OF BREATH   albuterol (VENTOLIN HFA) 108 (90 Base) MCG/ACT inhaler TAKE 2 PUFFS BY MOUTH EVERY 6 HOURS AS NEEDED FOR WHEEZE OR SHORTNESS OF BREATH   AMBULATORY  NON FORMULARY MEDICATION Carpal Tunnel Wrist brace. Rt  Carpal Tunnel Syndrome G56.01  Disp 1   AMBULATORY NON FORMULARY MEDICATION Lace up ankle brace (ASO type)  Ankle sprain S93.491A  Disp 1   amitriptyline (ELAVIL) 100 MG tablet TAKE 1 TABLET BY MOUTH EVERYDAY AT BEDTIME   amLODipine (NORVASC) 10 MG tablet TAKE 1 TABLET BY MOUTH EVERY DAY   Biotin 5000 MCG TABS Take 5,000 mg by mouth 2 (two) times daily.    budesonide-formoterol (SYMBICORT) 160-4.5 MCG/ACT inhaler INHALE 2 PUFFS INTO LUNGS 2 TIMES DAILY AS DIRECTED   chlorthalidone (HYGROTON) 25 MG tablet Take 1 tablet (25 mg total) by mouth daily.   Cholecalciferol 1.25 MG (50000 UT) capsule Take 1 capsule (50,000 Units total) by mouth once a week. (Patient taking differently: Take 4,000 Units by mouth daily. 2000 mg each  gummies)   cloNIDine (CATAPRES - DOSED IN MG/24 HR) 0.3 mg/24hr patch PLACE 2 PATCHES ONTO SKIN ONCE WEEKLY   clopidogrel (PLAVIX) 75 MG tablet Take 1 tablet (75 mg total) by mouth daily.   clotrimazole (MYCELEX) 10 MG troche TAKE 1 TABLET BY MOUTH 5 TIMES DAILY.   COVID-19 mRNA bivalent vaccine, Moderna, (MODERNA COVID-19 BIVAL BOOSTER) 50 MCG/0.5ML injection Inject into the muscle.   dexlansoprazole (DEXILANT) 60 MG capsule Take 1 capsule (60 mg total) by mouth daily.   dextromethorphan-guaiFENesin (MUCINEX DM) 30-600 MG 12hr tablet Take 2 tablets by mouth 2 (two) times daily.   diltiazem (CARDIZEM CD) 240 MG 24 hr capsule TAKE 1 CAPSULE BY MOUTH EVERY DAY   doxazosin (CARDURA) 8 MG tablet TAKE 2 TABLETS AT BEDTIME   DULoxetine (CYMBALTA) 30 MG capsule TAKE 1 CAPSULE (30 MG TOTAL) BY MOUTH DAILY. WITH '60MG'$  FOR TOTAL OF 90  MG (Patient taking differently: Take 30 mg by mouth See admin instructions. With '60mg'$  for total of 90 mg)   DULoxetine (CYMBALTA) 60 MG capsule TAKE 1 CAPSULE (60 MG TOTAL) BY MOUTH DAILY. FOR TOTAL OF 90 MG (Patient taking differently: Take 60 mg by mouth See admin instructions. Take with 30 mg  for a total of 90 mg in the morning)   ENTRESTO 97-103 MG TAKE 1 TABLET BY MOUTH TWICE A DAY   EPINEPHRINE 0.3 mg/0.3 mL IJ SOAJ injection INJECT 0.3 MLS INTO THE MUSCLE ONCE AS NEEDED FOR ANAPHYLAXIS (Patient taking differently: Inject 0.3 mg into the muscle as needed for anaphylaxis.)   ezetimibe (ZETIA) 10 MG tablet Take 1 tablet (10 mg total) by mouth daily.   famotidine (PEPCID) 20 MG tablet Take 1-2 tablets (20-40 mg total) by mouth at bedtime. (Patient taking differently: Take 40 mg by mouth at bedtime.)   gabapentin (NEURONTIN) 300 MG capsule Take 1 capsule (300 mg total) by mouth 3 (three) times daily. One three times daily (Patient taking differently: Take 300 mg by mouth 3 (three) times daily.)   lidocaine-prilocaine (EMLA) cream Apply 1 application topically every 28 (twenty-eight) days. Apply 1 application to skin ever 28 days   metoCLOPramide (REGLAN) 10 MG tablet TAKE 1 TABLET BY MOUTH 4 TIMES DAILY. (Patient taking differently: Take 10 mg by mouth 4 (four) times daily -  before meals and at bedtime.)   metolazone (ZAROXOLYN) 2.5 MG tablet Take 1 tablet (2.5 mg total) by mouth once a week.   minoxidil (LONITEN) 2.5 MG tablet TAKE 2 TABLETS (5 MG TOTAL) BY MOUTH 2 (TWO) TIMES DAILY.   montelukast (SINGULAIR) 10 MG tablet TAKE 1 TABLET BY MOUTH EVERYDAY AT BEDTIME   Nebivolol HCl 20 MG TABS TAKE 2 TABLETS BY MOUTH EVERY DAY   nitroGLYCERIN (NITROSTAT) 0.4 MG SL tablet Place 1 tablet (0.4 mg total) under the tongue every 5 (five) minutes as needed for chest pain.   oxymetazoline (MUCINEX SINUS-MAX FULL FORCE) 0.05 % nasal spray Place 1 spray into both nostrils 2 (two) times daily.   Potassium Chloride ER 20 MEQ TBCR Take 2 tablets by mouth 2 (two) times daily.   primidone (MYSOLINE) 50 MG tablet Take 0.5 tablets (25 mg total) by mouth at bedtime.   promethazine (PHENERGAN) 25 MG tablet TAKE 1 TABLET (25 MG TOTAL) BY MOUTH 3 (THREE) TIMES DAILY AS NEEDED FOR NAUSEA OR VOMITING.    promethazine-dextromethorphan (PROMETHAZINE-DM) 6.25-15 MG/5ML syrup TAKE 5 MLS BY MOUTH 4 TIMES A DAY AS NEEDED FOR COUGH   ranolazine (RANEXA) 1000 MG SR tablet TAKE 1 TABLET BY MOUTH TWICE A DAY   Respiratory Therapy Supplies (FLUTTER) DEVI Use as directed   rosuvastatin (CRESTOR) 40 MG tablet Take 1 tablet (40 mg total) by mouth daily.   Spacer/Aero-Holding Chambers (AEROCHAMBER MV) inhaler Use as instructed   spironolactone (ALDACTONE) 100 MG tablet Take 1 tablet (100 mg total) by mouth daily.   torsemide (DEMADEX) 20 MG tablet Take 2 tablets (40 mg total) by mouth 2 (two) times daily.   Current Facility-Administered Medications for the 09/17/21 encounter (Office Visit) with Loel Dubonnet, NP  Medication   sodium chloride flush (NS) 0.9 % injection 3 mL     Review of Systems      All other systems reviewed and are otherwise negative except as noted above.  Physical Exam    VS:  BP (!) 160/70 (BP Location: Left Arm, Patient Position: Sitting, Cuff Size: Normal)  Pulse (!) 106   Ht '5\' 5"'$  (1.651 m)   Wt 219 lb (99.3 kg)   LMP  (LMP Unknown)   SpO2 97%   BMI 36.44 kg/m  , BMI Body mass index is 36.44 kg/m.  Wt Readings from Last 3 Encounters:  09/17/21 219 lb (99.3 kg)  08/21/21 (P) 219 lb (99.3 kg)  07/18/21 216 lb 9.6 oz (98.2 kg)   GEN: Well nourished, overweight, well developed, in no acute distress. HEENT: normal. Neck: Supple, no JVD, carotid bruits, or masses. Cardiac: RRR, no murmurs, rubs, or gallops. No clubbing, cyanosis, edema.  Radials/PT 2+ and equal bilaterally.  Respiratory:  Respirations regular and unlabored, diminished breath sounds bilateral lower lobes. GI: Soft, nontender, nondistended. MS: No deformity or atrophy. Skin: Warm and dry, no rash. Neuro:  Strength and sensation are intact. Psych: Normal affect.  Assessment & Plan    Malignant hypertension - BP not controlled. . Previously intolerance to Imdur. Present prescribed antihypertensive  regimen includes Clonidine patch, Amlodipine, Diltiazem, Doxazosin, Hydralazine, chlorthalidone, Torsemide, Nebivolol, Ranexa, Spironolactone.    Heart healthy diet and regular cardiovascular exercise encouraged.  She has completed PREP program at the [facility].  Participating in renal denervation study. Due fatigue over the last 4 days, CMP, magnesium, CBC today.  24 hr BP monitor as part of renal denervation study hypotensive readings are suspicious for error as she did have to reposition the cuff while wearing.  She has repeat BP monitoring upcoming this week per her report. Will defer change to her antihypertensive regimen today.   Chronic stable angina / CAD - Intolerant of Imdur due to headache.She is on maximum dose Ranexa. GDMT includes Plavix (no Aspirin due to allergy), Nebivolol, Rosuvastatin, Zetia. R/LHC 09/2020 with stable single vessel CAD with apical 80% stenosis unchanged with torturous coronary arteries. Distal LAD lesion not favorable for PCI. No symptoms concerning for worsening angina.  Hypertriglyceridemia / HLD, LDL goal <70 - 08/22/20 total cholesterol 199, HDL 44, triglycerides 114, LDL 134, AST 30, ALT 35. Continue Zetia 10 QD, Rosuvastatin '40mg'$  QD.  Update lipid panel.  Will include direct LDL as she did have cabbage and rice  OSA - BIPAP compliance encouraged. Reports wearing every evening with her oxygen. She follows with Dr. Radford Pax in regards to her OSA.   Chronic diastolic heart failure -euvolemic on exam with no lower extremity edema.  Continue current dose of torsemide, metolazone, Entresto, nebivolol.  Stable renal function despite multiple diuretics.  Asthma - Follows with pulmonology Dr. Melvyn Novas.  No signs of acute exacerbation  SVT - No evidence of recurrence.   Breast cancer - 08/2020 mammogram no evidence of malignancy. On Goserelin every 4 weeks until age 31 per oncology team.   Disposition: Follow up in 3 months with Dr. Oval Linsey or APP.  Signed, Loel Dubonnet,  NP 09/17/2021, 10:08 AM [place]

## 2021-09-17 ENCOUNTER — Encounter (HOSPITAL_BASED_OUTPATIENT_CLINIC_OR_DEPARTMENT_OTHER): Payer: Self-pay | Admitting: Family

## 2021-09-17 ENCOUNTER — Ambulatory Visit (INDEPENDENT_AMBULATORY_CARE_PROVIDER_SITE_OTHER): Payer: Medicare Other | Admitting: Family

## 2021-09-17 VITALS — BP 160/70 | HR 106 | Ht 65.0 in | Wt 219.0 lb

## 2021-09-17 DIAGNOSIS — R002 Palpitations: Secondary | ICD-10-CM

## 2021-09-17 DIAGNOSIS — E785 Hyperlipidemia, unspecified: Secondary | ICD-10-CM

## 2021-09-17 DIAGNOSIS — I10 Essential (primary) hypertension: Secondary | ICD-10-CM

## 2021-09-17 DIAGNOSIS — I5032 Chronic diastolic (congestive) heart failure: Secondary | ICD-10-CM

## 2021-09-17 DIAGNOSIS — G4733 Obstructive sleep apnea (adult) (pediatric): Secondary | ICD-10-CM | POA: Diagnosis not present

## 2021-09-17 DIAGNOSIS — I25118 Atherosclerotic heart disease of native coronary artery with other forms of angina pectoris: Secondary | ICD-10-CM

## 2021-09-17 NOTE — Patient Instructions (Signed)
Medication Instructions:  Your physician recommends that you continue on your current medications as directed. Please refer to the Current Medication list given to you today.  Urban Gibson, NP is sending a message to the research team to see if any changes need to be made.  *If you need a refill on your cardiac medications before your next appointment, please call your pharmacy*   Lab Work: Your physician recommends that you have lab work today: CMET, Magnesium, Direct LDL, Lipid and CBC  If you have labs (blood work) drawn today and your tests are completely normal, you will receive your results only by: MyChart Message (if you have MyChart) OR A paper copy in the mail If you have any lab test that is abnormal or we need to change your treatment, we will call you to review the results.  Follow-Up: At [facility], you and your health needs are our priority.  As part of our continuing mission to provide you with exceptional heart care, we have created designated Provider Care Teams.  These Care Teams include your primary Cardiologist (physician) and Advanced Practice Providers (APPs -  Physician Assistants and Nurse Practitioners) who all work together to provide you with the care you need, when you need it.  We recommend signing up for the patient portal called "MyChart".  Sign up information is provided on this After Visit Summary.  MyChart is used to connect with patients for Virtual Visits (Telemedicine).  Patients are able to view lab/test results, encounter notes, upcoming appointments, etc.  Non-urgent messages can be sent to your provider as well.   To learn more about what you can do with MyChart, go to [email or web address].    Your next appointment:   3 month(s)  The format for your next appointment:   In Person  Provider:   Dr. Skeet Latch  Important Information About Sugar

## 2021-09-18 LAB — COMPREHENSIVE METABOLIC PANEL
ALT: 58 IU/L — ABNORMAL HIGH (ref 0–32)
AST: 51 IU/L — ABNORMAL HIGH (ref 0–40)
Albumin/Globulin Ratio: 1.4 (ref 1.2–2.2)
Albumin: 4.3 g/dL (ref 3.8–4.8)
Alkaline Phosphatase: 77 IU/L (ref 44–121)
BUN/Creatinine Ratio: 8 — ABNORMAL LOW (ref 9–23)
BUN: 7 mg/dL (ref 6–24)
Bilirubin Total: 0.4 mg/dL (ref 0.0–1.2)
CO2: 24 mmol/L (ref 20–29)
Calcium: 9.5 mg/dL (ref 8.7–10.2)
Chloride: 105 mmol/L (ref 96–106)
Creatinine, Ser: 0.86 mg/dL (ref 0.57–1.00)
Globulin, Total: 3.1 g/dL (ref 1.5–4.5)
Glucose: 117 mg/dL — ABNORMAL HIGH (ref 70–99)
Potassium: 3.3 mmol/L — ABNORMAL LOW (ref 3.5–5.2)
Sodium: 145 mmol/L — ABNORMAL HIGH (ref 134–144)
Total Protein: 7.4 g/dL (ref 6.0–8.5)
eGFR: 84 mL/min/{1.73_m2} (ref >59)

## 2021-09-18 LAB — MAGNESIUM: Magnesium: 2.2 mg/dL (ref 1.6–2.3)

## 2021-09-18 LAB — CBC
Hematocrit: 43.7 % (ref 34.0–46.6)
Hemoglobin: 14.3 g/dL (ref 11.1–15.9)
MCH: 29.6 pg (ref 26.6–33.0)
MCHC: 32.7 g/dL (ref 31.5–35.7)
MCV: 91 fL (ref 79–97)
Platelets: 222 10*3/uL (ref 150–450)
RBC: 4.83 x10E6/uL (ref 3.77–5.28)
RDW: 13.7 % (ref 11.7–15.4)
WBC: 4.8 10*3/uL (ref 3.4–10.8)

## 2021-09-18 LAB — LIPID PANEL
Chol/HDL Ratio: 4.7 ratio — ABNORMAL HIGH (ref 0.0–4.4)
Cholesterol, Total: 184 mg/dL (ref 100–199)
HDL: 39 mg/dL — ABNORMAL LOW (ref >39)
LDL Chol Calc (NIH): 114 mg/dL — ABNORMAL HIGH (ref 0–99)
Triglycerides: 177 mg/dL — ABNORMAL HIGH (ref 0–149)
VLDL Cholesterol Cal: 31 mg/dL (ref 5–40)

## 2021-09-18 LAB — LDL CHOLESTEROL, DIRECT: LDL Direct: 115 mg/dL — ABNORMAL HIGH (ref 0–99)

## 2021-09-19 ENCOUNTER — Other Ambulatory Visit (HOSPITAL_BASED_OUTPATIENT_CLINIC_OR_DEPARTMENT_OTHER): Payer: Self-pay | Admitting: *Deleted

## 2021-09-19 DIAGNOSIS — Z5181 Encounter for therapeutic drug level monitoring: Secondary | ICD-10-CM

## 2021-09-19 DIAGNOSIS — E876 Hypokalemia: Secondary | ICD-10-CM

## 2021-09-19 NOTE — Addendum Note (Signed)
Addended by: Alvina Filbert B on: 09/19/2021 10:19 AM   Modules accepted: Orders

## 2021-09-26 ENCOUNTER — Other Ambulatory Visit: Payer: Self-pay | Admitting: Cardiovascular Disease

## 2021-09-27 NOTE — Telephone Encounter (Signed)
Rx(s) sent to pharmacy electronically.  

## 2021-10-04 ENCOUNTER — Telehealth: Payer: Self-pay | Admitting: Pharmacist

## 2021-10-04 ENCOUNTER — Ambulatory Visit (INDEPENDENT_AMBULATORY_CARE_PROVIDER_SITE_OTHER): Payer: Medicare Other | Admitting: Pharmacist

## 2021-10-04 VITALS — BP 171/102 | HR 72 | Temp 98.5°F | Resp 14 | Wt 216.0 lb

## 2021-10-04 DIAGNOSIS — I25118 Atherosclerotic heart disease of native coronary artery with other forms of angina pectoris: Secondary | ICD-10-CM

## 2021-10-04 DIAGNOSIS — E785 Hyperlipidemia, unspecified: Secondary | ICD-10-CM | POA: Diagnosis not present

## 2021-10-04 MED ORDER — HYDRALAZINE HCL 50 MG PO TABS: 50.0000 mg | ORAL_TABLET | Freq: Three times a day (TID) | ORAL | 0 refills | Status: DC

## 2021-10-04 NOTE — Progress Notes (Signed)
Patient ID: Erica [name]                 DOB: [birth year]                    MRN: [id number]     HPI: Erica [name] is a 46 y.o. female patient referred to lipid clinic by Erica [name]. PMH is significant for HTN, breast cancer, CHF, obesity and HLD.  Patient has a history of noncompliance with treatment regimens.  Patient presents today in good spirits. Recently became a grandmother again. Reports compliance with rosuvastatin and ezetimibe with no adverse effects.  Reports she was previously going to the [facility] but has not been recently. Says she is physically active chasing her 30 year old grandchild around.  Current Medications:  Rosuvastatin '40mg'$  daily Zetia '10mg'$  daily  Intolerances: N/A  Risk Factors:  CHF HTN  LDL goal: <70  Labs: TC 184, Trigs 177, HDL 39, LDL 115 (09/17/21 on rosuvastatin and ezetimibe)  Past Medical History:  Diagnosis Date   Allergy    Anxiety    Apnea, sleep 05/04/2014   AHI 31/hr now on CPAP at 12cm H2O   Arthritis    Asthma    Asthma without status asthmaticus 01/19/2016   Axillary hidradenitis suppurativa 08/01/2011   Blood transfusion without reported diagnosis    Breast cancer ([place]) 2010   a. locally advanced left breast carcinoma diagnosed in 2010 and treated with neoadjuvant chemotherapy with docetaxel and cyclophosphamide as well as paclitaxel. This was followed by radiation therapy which was completed in 2011.   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  -Med calendar 12/07/12 , 04/06/2013 , 04/18/2014,, 08/30/2014 , 11/08/2014  - PFT's 01/12/2013  1.73 (65%)  Ratio 73 and no better p B2, DLCO 86% - trial off    Depression with somatization 06/15/2015   Dyspnea on exertion    a. 01/2013 Lexi MV: EF 54%, no ischemia/infarct;  b. 05/2013 Echo: EF 60-65%, no rwma, Gr 2 DD;  b.    Essential hypertension 10/09/2014   Estimated Creatinine Clearance: 82.4 mL/min (by C-G formula based on SCr of 0.98 mg/dL).   Fatty liver    Gastroparesis    GERD (gastroesophageal reflux disease)    Hyperglycemia 04/13/2011   Hypertension    Hypertensive heart disease 09/26/2015   Hypertriglyceridemia without hypercholesterolemia 06/15/2015   Framingham risk score is 1%    Idiopathic intracranial hypertension 10/21/2014   Impaired glucose tolerance 04/13/2011   Insomnia secondary to anxiety 04/05/2015   Lymphadenitis, chronic    restricted LEFT extremity   Malignant neoplasm of upper-outer quadrant of left breast in female, estrogen receptor negative ([place]) 01/07/2013   Stage III, Invasive Ductal, s/p partial Left Mastectomy/Lumpectomy (baseball sized) with Lymph Node dissection, had Chemo and RadRx     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[place]) 09/27/2010   OSA (obstructive sleep apnea) 05/04/2014   AHI 31/hr now on CPAP at  12cm H2O   Oxygen deficiency    Uses 02 in CPAP   Persistent proteinuria 03/31/2018   Personal history of chemotherapy 2010   Personal history of radiation therapy 2010   Sleep apnea    SVT (supraventricular tachycardia) ([place]) 08/22/2020   Tibialis posterior tendinitis, left 07/15/2018   Injected in March 10, 2019   Upper airway cough syndrome 10/09/2014   Onset around 2012 with assoc pseudowheeze MRI 05/18/15 > Paranasal sinuses are clear - 11/17/2015 rec increase h1 to 2 hs to help with noct cough  - Allergy profile 11/17/2015 >  Eos 0.1 /  IgE  80 POS RAST  to grass, ragweed, cedar tree  - d/c acei 03/06/2018  - flare end of Jan 2020 in setting of ? Uri/  poorly controlled GERD - flare mid Oct 2020 with uri / assoc secondary gerd   Vitamin D deficiency disease 03/31/2018    Current Outpatient Medications on File Prior to Visit  Medication Sig Dispense Refill   acetaZOLAMIDE (DIAMOX) 250 MG tablet Take 4 tablets (1,000 mg total) by mouth 3 (three) times daily. 1080 tablet 3   albuterol (PROVENTIL) (2.5 MG/3ML) 0.083% nebulizer solution USE 1 VIAL VIA NEBULIZER EVERY 4 HOURS AND AS NEEDED FOR WHEEZING OR SHORTNESS OF BREATH 75 mL 1   albuterol (VENTOLIN HFA) 108 (90 Base) MCG/ACT inhaler TAKE 2 PUFFS BY MOUTH EVERY 6 HOURS AS NEEDED FOR WHEEZE OR SHORTNESS OF BREATH 8.5 each 2   AMBULATORY NON FORMULARY MEDICATION Carpal Tunnel Wrist brace. Rt  Carpal Tunnel Syndrome G56.01  Disp 1 1 each 0   AMBULATORY NON FORMULARY MEDICATION Lace up ankle brace (ASO type)  Ankle sprain S93.491A  Disp 1 1 each 0   amitriptyline (ELAVIL) 100 MG tablet TAKE 1 TABLET BY MOUTH EVERYDAY AT BEDTIME 90 tablet 0   amLODipine (NORVASC) 10 MG tablet TAKE 1 TABLET BY MOUTH EVERY DAY 90 tablet 1   Biotin 5000 MCG TABS Take 5,000 mg by mouth 2 (two) times daily.      budesonide-formoterol (SYMBICORT) 160-4.5 MCG/ACT inhaler INHALE 2 PUFFS INTO LUNGS 2 TIMES DAILY AS DIRECTED 30.6 each 3   chlorthalidone (HYGROTON) 25 MG tablet Take 1 tablet (25 mg total) by mouth daily. 90 tablet 3   Cholecalciferol 1.25 MG (50000 UT) capsule Take 1 capsule (50,000 Units total) by mouth once a week. (Patient taking differently: Take 4,000 Units by mouth daily. 2000 mg each  gummies) 12 capsule 1   cloNIDine (CATAPRES - DOSED IN MG/24 HR) 0.3 mg/24hr patch PLACE 2 PATCHES ONTO SKIN ONCE WEEKLY 24 patch 2   clopidogrel (PLAVIX) 75 MG tablet Take 1 tablet (75 mg total) by mouth daily. 90 tablet 3   clotrimazole (MYCELEX) 10 MG troche TAKE 1 TABLET BY MOUTH 5 TIMES DAILY. 70 Troche 5   COVID-19 mRNA bivalent vaccine, Moderna, (MODERNA COVID-19 BIVAL BOOSTER) 50 MCG/0.5ML injection Inject  into the muscle. 0.5 mL 0   dexlansoprazole (DEXILANT) 60 MG capsule Take 1 capsule (60 mg total) by mouth daily. 90 capsule 2   dextromethorphan-guaiFENesin (MUCINEX DM) 30-600 MG 12hr tablet Take 2 tablets by mouth 2 (two) times daily.     diltiazem (CARDIZEM CD) 240 MG 24 hr capsule TAKE 1 CAPSULE BY MOUTH EVERY DAY 90 capsule 1   doxazosin (CARDURA) 8 MG tablet TAKE 2 TABLETS AT BEDTIME 180 tablet 3   DULoxetine (CYMBALTA) 30 MG capsule TAKE 1 CAPSULE (30 MG TOTAL) BY MOUTH DAILY. WITH '60MG'$  FOR TOTAL OF 90  MG (Patient taking differently: Take 30 mg by mouth See admin instructions. With '60mg'$  for total of 90 mg) 30 capsule 3   DULoxetine (CYMBALTA) 60 MG capsule TAKE 1 CAPSULE (60 MG TOTAL) BY MOUTH DAILY. FOR TOTAL OF 90 MG (Patient taking differently: Take 60 mg by mouth See admin instructions. Take with 30 mg for a total of 90 mg in the morning) 30 capsule 3   ENTRESTO 97-103 MG TAKE 1 TABLET BY MOUTH TWICE A DAY 60 tablet 11   EPINEPHRINE 0.3 mg/0.3 mL IJ SOAJ injection INJECT 0.3 MLS INTO THE MUSCLE ONCE AS NEEDED FOR ANAPHYLAXIS (Patient taking differently: Inject 0.3 mg into the muscle as needed for anaphylaxis.) 2 Device 2   ezetimibe (ZETIA) 10 MG tablet Take 1 tablet (10 mg total) by mouth daily. 90 tablet 3   famotidine (PEPCID) 20 MG tablet Take 1-2 tablets (20-40 mg total) by mouth at bedtime. (Patient taking differently: Take 40 mg by mouth at bedtime.) 180 tablet 2   gabapentin (NEURONTIN) 300 MG capsule Take 1 capsule (300 mg total) by mouth 3 (three) times daily. One three times daily (Patient taking differently: Take 300 mg by mouth 3 (three) times daily.) 90 capsule 2   hydrALAZINE (APRESOLINE) 100 MG tablet TAKE 1 TABLET BY MOUTH 3 TIMES DAILY. 270 tablet 2   lidocaine-prilocaine (EMLA) cream Apply 1 application topically every 28 (twenty-eight) days. Apply 1 application to skin ever 28 days     metoCLOPramide (REGLAN) 10 MG tablet TAKE 1 TABLET BY MOUTH 4 TIMES DAILY. (Patient  taking differently: Take 10 mg by mouth 4 (four) times daily -  before meals and at bedtime.) 360 tablet 0   metolazone (ZAROXOLYN) 2.5 MG tablet Take 1 tablet (2.5 mg total) by mouth once a week. 10 tablet 2   minoxidil (LONITEN) 2.5 MG tablet TAKE 2 TABLETS (5 MG TOTAL) BY MOUTH 2 (TWO) TIMES DAILY. 360 tablet 1   montelukast (SINGULAIR) 10 MG tablet TAKE 1 TABLET BY MOUTH EVERYDAY AT BEDTIME 90 tablet 3   Nebivolol HCl 20 MG TABS TAKE 2 TABLETS BY MOUTH EVERY DAY 180 tablet 3   nitroGLYCERIN (NITROSTAT) 0.4 MG SL tablet Place 1 tablet (0.4 mg total) under the tongue every 5 (five) minutes as needed for chest pain. 25 tablet 2   oxymetazoline (MUCINEX SINUS-MAX FULL FORCE) 0.05 % nasal spray Place 1 spray into both nostrils 2 (two) times daily.     Potassium Chloride ER 20 MEQ TBCR Take 2 tablets by mouth 2 (two) times daily. 120 tablet 5   primidone (MYSOLINE) 50 MG tablet Take 0.5 tablets (25 mg total) by mouth at bedtime. 45 tablet 3   promethazine (PHENERGAN) 25 MG tablet TAKE 1 TABLET (25 MG TOTAL) BY MOUTH 3 (THREE) TIMES DAILY AS NEEDED FOR NAUSEA OR VOMITING. 270 tablet 1   promethazine-dextromethorphan (PROMETHAZINE-DM) 6.25-15 MG/5ML syrup TAKE 5 MLS BY MOUTH 4 TIMES A DAY AS NEEDED FOR COUGH 473 mL 2   ranolazine (RANEXA) 1000 MG SR tablet TAKE 1 TABLET BY MOUTH TWICE A DAY 180 tablet 3   Respiratory Therapy Supplies (FLUTTER) DEVI Use as directed 1 each 0   rosuvastatin (CRESTOR) 40 MG tablet Take 1 tablet (40 mg total) by mouth daily. 90 tablet 3   Spacer/Aero-Holding Chambers (AEROCHAMBER MV) inhaler Use as instructed 1 each 0   spironolactone (ALDACTONE) 100 MG tablet Take 1 tablet (100 mg total) by mouth daily. 90 tablet 3   torsemide (DEMADEX) 20 MG tablet Take  2 tablets (40 mg total) by mouth 2 (two) times daily. 360 tablet 3   Current Facility-Administered Medications on File Prior to Visit  Medication Dose Route Frequency Provider Last Rate Last Admin   goserelin (ZOLADEX)  injection 3.6 mg  3.6 mg Subcutaneous Q28 days Magrinat, Virgie Dad, MD   3.6 mg at 06/01/20 1009   sodium chloride flush (NS) 0.9 % injection 3 mL  3 mL Intravenous Q12H Loel Dubonnet, NP        Allergies  Allergen Reactions   Aspirin Shortness Of Breath and Palpitations    Pt can take ibuprofen without reaction   Doxycycline Anaphylaxis   Kiwi Extract Itching and Swelling    Lip swelling   Metoprolol Hives and Itching   Penicillins Shortness Of Breath and Palpitations    Has patient had a PCN reaction causing immediate rash, facial/tongue/throat swelling, SOB or lightheadedness with hypotension: Yes Has patient had a PCN reaction causing severe rash involving mucus membranes or skin necrosis: Yes Has patient had a PCN reaction that required hospitalization Yes Has patient had a PCN reaction occurring within the last 10 years: No If all of the above answers are "NO", then may proceed with Cephalosporin use.   Strawberry Extract Hives   Sulfamethoxazole-Trimethoprim Anaphylaxis    Facial, throat and tongue swelling with difficulty swallowing.    Assessment/Plan:  1. Hyperlipidemia - Patient LDL 115 which is above goal of <70 despite high intensity statin and ezetimibe. Has a history of non compliance but reports she has been taking her medications.  Discussed next options with patient including Repatha or Nexletol.  Patient liked idea of using a bi monthly injectable.  Using demo pen educated patient on mechanism of action, storage, site selection, administration and possible adverse effects. Patient able to demonstrate in room using demo pen. Will complete PA and contact patient when approved.  Reheck lipid panel in 2-3 months.  Continue rosuvastatin '40mg'$  daily Continue ezetimibe '10mg'$  daily Start Repatha '140mg'$  sq q 14 days Recheck lipid panel in 2-3 months.   Karren Cobble, PharmD, BCACP, [place], [place], [place], [place], [address] Phone: [phone], Fax:  [phone]

## 2021-10-04 NOTE — Patient Instructions (Signed)
It was good seeing you again  We would like your LDL (bad cholesterol) to be less than 70  Please continue your rosuvastatin '40mg'$  once a day and your ezetimibe '10mg'$  once a day  We would like to start you on a new medication called Repatha which you will inject once every 2 weeks  I will complete the prior authorization for you and contact you when it is approved  Once you start the medication we will recheck your cholesterol in 2-3 months  Please call with any questions  Karren Cobble, PharmD, [place], [place], [place], [place], [place], [address] Phone: [phone], Fax: [phone]

## 2021-10-04 NOTE — Telephone Encounter (Signed)
PA for Repatha submitted. Key: [employer]

## 2021-10-04 NOTE — Research (Signed)
[facility] CAP Informed Consent    Subject Name: Erica [name]   Subject met inclusion and exclusion criteria.  The informed consent form, study requirements and expectations were reviewed with the subject and questions and concerns were addressed prior to the signing of the consent form.  The subject verbalized understanding of the trial requirements.  The subject agreed to participate in the [facility] CAP trial and signed the informed consent at 10:45 am on 04-Oct-2021.  The informed consent was obtained prior to performance of any protocol-specific procedures for the subject.  A copy of the signed informed consent was given to the subject and a copy was placed in the subject's medical record.    Subject is a Re-consent and Re-screen   Patient ID:  [id number]              SCREENING - Visit 0    Visit Date: 04-Oct-2021  Screening Visit Information         Informed Consent Date:  04-Oct-2021   Protocol Version :    _0  A    _1   B  _2   C   ICF Version Date:   16/AUG/2022 ICF Version:     _3  A   _4  B   _5   C   Was this subject previously consented?  _6   Yes   _7   No   Initial Subject ID  [id number]   Medical History   Section 1 Respiratory Health History:   Does the subject have a history of chronic obstructive pulmonary      Disease (COPD)?               _8  Yes  _9   No   Does the subject have a history of Sleep Apnea? _10   Yes  _11  No     Sleep Apnea Type:  _12  Obstructive    _13  Central   _14   Mixed  _15  Unknown      Does the subject use CPAP regularly?  _16   Yes  _17   No   Section 2:  Endocrine Health History   Does the subject have a history of Diabetes?  _18   Yes  _19  No    Diabetes Type:   _20  Type I  _21   Type II  _22   Unknown   n/a   Is medication taken for diabetes?  _23  Yes  _24  No    (If Yes, please update medication log)   Section 3: Renal Health History   Does the subject have a history of chronic kidney disease? _25  Yes _26  No    Most recent  eGFR Value: _84___    mL/min/1.73M2    Sample Collection Date; 19/Jun/2023   Does the subject have a history of polycystic kidney disease?  _27  Yes _28  No   Section 4: Vascular Health History   Does the subject have a history of Cerebrovascular events?  _29  Yes  _30  No     If yes, what type of event?  _31  TIA   _32  Stroke  _33  Other, specify? ________   Section 5:  Cancer History   Does the subject have a history of cancer?  _34  Yes  _35  No    If yes, what type of cancer?  _breast__(left)____    If yes, estimated date of diagnosis?_31/MAR/2010___    Is the subject currently undergoing treatment for cancer?  _36   Yes  _37   No    Section 6: Other  Does the subject have any active implantable medical devices? 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  _5  Yes _6  No    (Except Hypertensive History)   Does the subject have a history of myocardial infarction (MI)?  _7  Yes  _8  No    If Yes, estimated date of most recent myocardial infarction (MI): _______ dd/mmm/yyyy   Does the subject have a history of atrial arrhythmias?  _9  Yes  _10  No      Check all that apply:   _11  Atrial Tachycardia   _12  Atrial Flutter   _13   Atrial Fibrillation                           _14  Other, Specify ____________________        If atrial fibrillation was selected, how is the AF classified?  _15   N/A          _16  Paroxysmal    _17  Persistent   _18  Permanent      Date of most recent documented episode of atrial arrhythmia?  _N/A______ dd/mmm/yyyy   Does the subject have a history of atrial ablation to treat arrhythmia? _19  Yes _20  No      If yes, date of most recent atrial ablation: _______  dd/mmm/yyyy   Does the subject have a history of ventricular  arrhythmias?  _21  Yes  _22  No      If yes, date of most recent ventricular arrhythmias: ________   dd/mmm/yyyy   Does the subject have a history of ventricular ablations to treat an arrhythmia?  _23  Yes _24  No     If Yes, date of most recent ventricular ablation: ________ dd/mmm/yyyy   Does the subject have a history of Heart Failure?  _25  Yes  _26  No   Has the subject been hospitalized for Heart Failure?  _27  Yes _28  No     If yes, estimated date of most recent hospitalization: ________ dd/mmm/yyyy   NYHA Class:  _29  Class I  _30  Class II  _31  Class III  _32  ClassI V  _33  Unknown    NYHA Classification date:     Does the subject have a history of cardiac arrest? _34  Yes _35  No    If Yes, estimated date of the most recent episode of cardiac arrest: _____ dd/mmm/yyyy   Does the subject have an implanted cardioverter defibrillator (ICD)? _36  Yes _37  No   If yes, Estimated date of most recent implanted ICD:  _______  dd/mmm/yyyy   Does the subject have a history of bradycardia:    _38  Yes  _39  No   Does the subject have an implanted pacemaker?  _40  Yes  _41  No   Does the subject have a history of hyperlipidemia? _42  Yes _43  No   Does the subject have a history of documented episodes of angina? _44  Yes _45  No     If Yes, estimated date of the most recent episode of angina:  _27-JUL-2022___ dd/mmm/yyyy   Does the subject have a history of coronary artery disease (CAD)? _46  Yes _47  No   Does the subject have a history of revascularization?  _48  Yes _49  No  Type of revascularization?   _0  Stent  _1  CABG Other, Specify: _none d/t blockage is too distal to treat with angioplasty or stenting and very tortuous coronary arteries with no significant stenoses     Estimated date of the most recent vascularization procedure: _27-JUL-2022   Does the subject have a history of cardiac valve repair or replacement?   _2  Yes _3  No    Patient ID:  [place]-  [id number]  [id number]  [id number]                     Visit Date: 23-Jul-2021    Hypertension History:    Does the Subject have a history of hypertension? _4  Yes _5  No   What was the date of diagnosis of hypertension: 01/Jan/2010  Hypertension Category:          _6  Controlled with medication                                                  _7  Uncontrolled with medication                                                 _8  Uncontrolled with no medication   Does the subject have documented Masked Hypertension?  (Subject's office   BP readings are substantially lower than home BP readings under   Normal conditions.)    _9  Yes   _10  No   Does the subject have white coat hypertension? (Subject's feeling of   Anxiety in medical office results in high BP reading)    _11  Yes _12  No   Has the subject ever been hospitalized for hypertensive crisis? _13  Yes _14  No    If yes, what was the date of the most recent hospitalization for          Hypertensive crisis?   ________ dd/mmm/yyyy   Does the subject have a history of a prior Renal Denervation procedure? _15  Yes _16  No     If Yes,  Renal Denervation System used to treat the Subject:____________                 Date of Renal Denervation treatment:________  dd/mmm/yyyy   Does the subject have a history of primary pulmonary hypertension? _17  Yes _18  No     Physical Examination:            Was the Physical examination performed?  _19  Yes  _20   No    If no, reason not done: ____________   Time of collection:   10: 30       Weight:  216 _21  kg _22  lb        Height: _65_ _23  cm _24  Inches   Abdominal Circumference measurement:  __106__  cm   Right Brachial circumference measurement: _38__ cm   Left Brachial circumference measurement: _N/A  lymphedema Left breast surgery for Cancer Heart Rate:  __82___  bpm     Demographics: All information will be found within the subject's Epic EMR    Office Blood Pressure Measurements: Detailed instructions can be found in the    Manual of Operations binder   _25   Initial Right Arm  BP Measurement: _171/102_  mmHg _26   Initial Left Arm BP Measurement: ____/______ mmHg _0   Not Done _1  Right /  _2  Left  and reason: __lymphedema, no BPs d/t left breast cancer _____   *Arm chosen and used for blood pressure measurement for the duration of the study:       _3    Right Arm       _4    Left Arm        Systolic BP          (mmHg) Diastolic BP    (mmHg) Pulse              (bpm)         1. First-seated  BP          171              102          72    2. Second-seated BP          171             102        71    3. Third-Seated BP          184              107         73    4. Fourth-               STANDING  BP          178               96          73    Per [facility] iMEDIDATA;  derived from above data Avg Seated Systolic BP:  628.6 Avg Seated Diastolic BP: 381.7 Avg. Seated Pulse Rate: 72    Per Dr. Oval Linsey Minoxidil and Diltiazem will be discontinued at this time. Hydralazine will be reduced to 50 MG TID.

## 2021-10-05 ENCOUNTER — Encounter: Payer: Self-pay | Admitting: Pharmacist

## 2021-10-05 DIAGNOSIS — I25118 Atherosclerotic heart disease of native coronary artery with other forms of angina pectoris: Secondary | ICD-10-CM

## 2021-10-05 DIAGNOSIS — I208 Other forms of angina pectoris: Secondary | ICD-10-CM

## 2021-10-05 DIAGNOSIS — E785 Hyperlipidemia, unspecified: Secondary | ICD-10-CM

## 2021-10-05 MED ORDER — REPATHA SURECLICK 140 MG/ML ~~LOC~~ SOAJ: 1.0000 mL | SUBCUTANEOUS | 3 refills | Status: DC

## 2021-10-11 ENCOUNTER — Other Ambulatory Visit: Payer: Self-pay

## 2021-10-11 ENCOUNTER — Inpatient Hospital Stay: Payer: Medicare Other | Attending: Oncology

## 2021-10-11 VITALS — BP 192/89 | HR 70 | Temp 98.7°F | Resp 16

## 2021-10-11 DIAGNOSIS — Z79818 Long term (current) use of other agents affecting estrogen receptors and estrogen levels: Secondary | ICD-10-CM | POA: Insufficient documentation

## 2021-10-11 DIAGNOSIS — C50412 Malignant neoplasm of upper-outer quadrant of left female breast: Secondary | ICD-10-CM | POA: Insufficient documentation

## 2021-10-11 MED ORDER — GOSERELIN ACETATE 3.6 MG ~~LOC~~ IMPL
3.6000 mg | DRUG_IMPLANT | SUBCUTANEOUS | Status: DC
  Administered 2021-10-11: 3.6 mg via SUBCUTANEOUS
  Filled 2021-10-11: qty 3.6

## 2021-10-11 NOTE — Progress Notes (Signed)
Patient takes B/P meds and this reading falls within her base line readings 192/89

## 2021-10-29 DIAGNOSIS — Z006 Encounter for examination for normal comparison and control in clinical research program: Secondary | ICD-10-CM

## 2021-10-29 NOTE — Research (Addendum)
[facility] TRIAL - [facility]  Subject is a screen fail due to being on Spironolactone.   Patient ID:  [id number]          BASELINE VISIT 1  Visit Date: 31/JUL/2023  Visit Type:   '[x]'$  In Clinic    '[]'$   Remote Visit  PHYSICAL EXAMINATION  Was the physical examination performed?          '[x]'$   Yes     '[]'$   No        If no, give reason:    Time of Collection:      _09_:00 HH:mm   Weight:    218_     '[]'$  kg   '[x]'$  Lbs   Abdominal circumference measurement    106  cm   Heart Rate    _87_ bpm   Office Blood Pressure Measurements        Arm    Systolic BP       (mmHg)      Diastolic BP          (mmHg)        Pulse       (bpm)   1. First-Seated BP  R 190 80 86   2. Second-Seated BP R 178 92 86   3. Third-Seated BP R 181 86 85   4. Fourth-Standing BP R 196 100 87   Ambulatory Blood Pressure   Has the patient been provided with an Ambulatory Blood Pressure monitor and shown how to use it?         '[x]'$   Yes         '[]'$   No   Was the first ABP recording valid?               (At least 21 valid daytime measurements and 7 nighttime)         '[]'$   Yes         '[]'$   No            If NO;    '[]'$  BP did not meet criteria-patient screen-failed    '[]'$  BP did not meet criteria - patient remains in study Lobbyist approval)    '[]'$  Insufficient data collected    '[]'$  Other, specify:______________       If insufficient data collected, is the patient willing to repeat the recording?    '[]'$    Yes    '[]'$    No    Date when the repeat recording was started     ___/____/_____     dd/mmm/yyyy    Is the patient's final average daytime ambulatory blood pressure                            >/= 135/85 mmHg?           '[]'$   Yes     '[]'$   No   *Enter ABPM data from the [facility] report into the [facility]. If repeat ABPM is approved, enter a repeat ABPM and Urine for Drug Metabolite as an Unscheduled Visit within the same timeframe.   Home Blood Pressure   Were the Home Blood Pressure Measurements collect?        '[x]'$   Yes      '[]'$   No         If No, specify the reason not done        *Please enter the 7-day  home blood pressure measurements from the diary into the [facility]   High BP Action   Did the subject meet the safety escape criteria as defined in protocol?  Safety Escape Criteria defined as: Home BP >/=454 mmHg systolic or >/= 098 mmHg diastolic at Baseline, associated with clinical events considered to be related to persistent or elevated hypertension AND verified by any of the following additional BP values will result in the subject being excluded:      * Average office BP >/= 119 mmHg systolic or >/= 147 mmHg diastolic.        * Daytime ABP >/= 829 mmHg systolic or >/= 562 mmHg diastolic.              '[]'$   Yes         '[]'$    No    If yes, please add an AE and exclude the subject    Witnessed Pill Intake    Was the Subject witnessed taking their antihypertensive medication at this visit?       '[x]'$  Yes    '[]'$   No       If No, specify the reason:__________________     SF-QOL Questionnaire   Was the SF-12 questionnaire completed?        '[x]'$   Yes     '[]'$   No     If yes, specify date     _31_/_Jul_/_2023_      dd    mmm   yyyy   If no, specify reason:    Urine for Drug Metabolites   Were two (2) 10 mL urine samples for drug metabolites collected?      '[x]'$   Yes  '[]'$   No     Urine sample collection date    _31_/_Jul_/_2023_      dd    mmm   yyyy    If No, specify reason:     Subject Continuation     Were any adverse events reported at this visit?            If yes, please complete an Adverse Event Log CRF     '[]'$  Yes     '[x]'$   No     Has the patient been hospitalized or seen at any clinic or doctor's off since the previous visit?     '[]'$  Yes     '[x]'$   No   Were any deviations observed at this visit?   If Yes, please complete a Deviation Log CRF     '[]'$  Yes     '[x]'$   No   Were there any changes to the patient's medications noted during this visit?  If yes, please add to the  Medication Log CRF     '[]'$  Yes     '[x]'$   No    Will the subject continue to the next visit?     '[]'$  Yes     '[]'$   No        *If the subject is not continuing in the study, please complete the End of Study Form   Pre-Procedure Testing Reminder    Screening Renal CTA or MRI     '[]'$            * If testing is done at the Baseline visit, please complete that section in the Pre-procedure worksheet.     12-Lead ECG     '[]'$      Blood for Chemistry     '[]'$   Urine for Chemistry     '[]'$       Negative Pregnancy test      '[]'$    N/A '[]'$

## 2021-11-05 DIAGNOSIS — Z006 Encounter for examination for normal comparison and control in clinical research program: Secondary | ICD-10-CM

## 2021-11-05 MED ORDER — MINOXIDIL 2.5 MG PO TABS: ORAL_TABLET | ORAL | 1 refills | Status: DC

## 2021-11-05 MED ORDER — DILTIAZEM HCL ER COATED BEADS 240 MG PO CP24: ORAL_CAPSULE | ORAL | 3 refills | Status: DC

## 2021-11-05 NOTE — Research (Signed)
[facility] Research Study  Patient has screen failed due to being on Spironolactone, this excludes her from the study. Per Dr. Oval Linsey, the patient is to resume her Cardizem and Minoxidil.

## 2021-11-08 ENCOUNTER — Inpatient Hospital Stay: Payer: Medicare Other | Attending: Adult Health

## 2021-11-08 ENCOUNTER — Other Ambulatory Visit: Payer: Self-pay

## 2021-11-08 VITALS — BP 192/98 | HR 73 | Temp 98.5°F | Resp 18

## 2021-11-08 DIAGNOSIS — C50412 Malignant neoplasm of upper-outer quadrant of left female breast: Secondary | ICD-10-CM | POA: Insufficient documentation

## 2021-11-08 DIAGNOSIS — Z79818 Long term (current) use of other agents affecting estrogen receptors and estrogen levels: Secondary | ICD-10-CM | POA: Diagnosis present

## 2021-11-08 MED ORDER — GOSERELIN ACETATE 3.6 MG ~~LOC~~ IMPL
3.6000 mg | DRUG_IMPLANT | SUBCUTANEOUS | Status: DC
  Administered 2021-11-08: 3.6 mg via SUBCUTANEOUS
  Filled 2021-11-08: qty 3.6

## 2021-12-04 ENCOUNTER — Encounter (HOSPITAL_BASED_OUTPATIENT_CLINIC_OR_DEPARTMENT_OTHER): Payer: Self-pay | Admitting: Cardiovascular Disease

## 2021-12-04 ENCOUNTER — Ambulatory Visit (INDEPENDENT_AMBULATORY_CARE_PROVIDER_SITE_OTHER): Payer: Medicare Other | Admitting: Cardiovascular Disease

## 2021-12-04 VITALS — BP 208/78 | HR 91 | Ht 65.0 in | Wt 220.0 lb

## 2021-12-04 DIAGNOSIS — I10 Essential (primary) hypertension: Secondary | ICD-10-CM

## 2021-12-04 DIAGNOSIS — I208 Other forms of angina pectoris: Secondary | ICD-10-CM | POA: Diagnosis not present

## 2021-12-04 DIAGNOSIS — E785 Hyperlipidemia, unspecified: Secondary | ICD-10-CM

## 2021-12-04 DIAGNOSIS — I25118 Atherosclerotic heart disease of native coronary artery with other forms of angina pectoris: Secondary | ICD-10-CM | POA: Diagnosis not present

## 2021-12-04 DIAGNOSIS — I2089 Other forms of angina pectoris: Secondary | ICD-10-CM

## 2021-12-04 MED ORDER — REPATHA SURECLICK 140 MG/ML ~~LOC~~ SOAJ: 1.0000 mL | SUBCUTANEOUS | 3 refills | Status: DC

## 2021-12-04 NOTE — Progress Notes (Signed)
24 hour BP monitor

## 2021-12-04 NOTE — Progress Notes (Signed)
Cardiology Office Note:    Date:  12/04/2021   ID:  FRANCYNE ARREAGA, DOB 26-Aug-1975, MRN [id number]  PCP:  Pcp, No  Cardiologist:  Skeet Latch, MD  Nephrologist:  Referring MD: No ref. provider found   CC: Hypertension  History of Present Illness:    Erica [name] is a 46 y.o. female with a hx of resistant hypertension, medically managed CAD, chronic diastolic heart failure, breast cancer s/p chemo and XRT, morbid obesity and OSA on BiPAP, here for follow-up. She initially established care in the hypertension clinic 04/10/2020. She reports that her BP has been elevated since she was treated for breast cancer in 2010.  Since then her blood pressure has never been very well have been controlled.  She has been on multiple antihypertensives and her blood pressure remains in the 268T to 419Q systolic.  She struggles with exertional dyspnea that makes it hard for her to walk even short distances.  Echo 03/2018 revealed LVEF 45 to 50% with grade 2 diastolic dysfunction.  There was mild MR.  PASP was 33.  Ms. Silbaugh has a history of recurrent syncope.  She had an episode that occurred while wearing an ambulatory monitor and the episode was felt to be due to narcolepsy.  She has sleep apnea but is not tolerant of her CPAP machine.  She now uses her BIPAP machine but struggles with insomnia.  She wakes up hourly.  She underwent left and right heart catheterization on 05/04/2019.  She was found to have 60% distal left circumflex and 80% distal LAD disease.  Given her aspirin allergy Dr. Meda Coffee recommended that she start clopidogrel.  Right atrial pressure was 10, and PA pressure was 26/10.  PCWP 18.  LVEDP was 23 mmHg.  At her first hypertension clinic visit there were concerns about whether she was taking her medication as prescribed.  Discrepancies between what was ordered and what she was actually picking up from the pharmacy.  She continues to report taking all medications as prescribed.  Her blood  pressures continue to run quite elevated.  At her initial assessment doxazosin was added to her regimen.  This does not seem to have helped much.  She followed up with our pharmacist and was started on amlodipine.  She was unable to exercise due to poorly controlled blood pressure. Renal artery Dopplers revealed 1- 59% stenosis bilaterally in 2019.  CT-A of the abdomen showed a 1.3 cm renal cyst but no adrenal adenomas.  There was no evidence of renal artery stenosis.  Ms. Trego saw our pharmacist on 12/2019.  She was in a lot of pain at the time.  Given her high number or medications and her pain, her medications were not changed.  She has struggled with LE edema.  LE Doppler was negative for DVT 10/2019.  She was given metolazone to take prior to torsemide.  For the last two months she has been more swollen but it is finally better.  Her weight has decreased from 211 lb to 187 lb.  Ms. Irion continues to struggle with shortness of breath. Her edema resolved but there was no improvement in her breathing.  She uses her CPAP every other night.  She notes that there is a recall on her CPAP and she isn't supposed to be using it.  However, she can't sleep well without it. Lately her BP at home has been in the 150-200s.  She notes that when she has pain it is higher.  She  has a lot of joint pain, especially in her feel.    Ms. Schara wanted to reduce her medications.  However her blood pressure was significantly elevated.  We reduced hydralazine to 50 mg 3 times a day and increase doxazosin to 8 mg twice daily in an effort to try and get her off of her 3 times daily medications and improve compliance.  Ranexa was increased due to chest pain.  This seems to have helped her chest pain.  It seems to occur randomly both at rest and with exertion.  Her clonidine was increased to 0.6 mg every 24 hours. She wore an  ambulatory monitor that showed PVCs and up to 8 beats of SVT.  The episodes seem to occur mostly in the early  a.m.  She started going to the PREP program at the [facility].  She notes that her heart rate goes up quickly.  It makes her feel exhausted.  She takes her rest when she needs to and start back exercising.  She has lower extremity edema by the end of the day but no orthopnea or PND.  She did get a new CPAP machine and struggles to use it because of claustrophobia.  However she does use it every night. [facility] continued to have exertional intolerance and chest pain.  She underwent left heart catheterization 09/2020 and her cath revealed 80% distal LAD and 30% LPA B with 50% PL 1 stenoses.  Overall unchanged from prior.  Her blood pressure is very poorly controlled, initially in the low 200s over 110s.  It did improve to 152/78 with hydralazine and verapamil IV.  It was noted that she was extremely anxious and that it was also contributing.  Her angina was thought to be due to microvascular disease and poorly controlled blood pressure.  Her distal LAD lesion was not thought to be favorable for PCI but if her chest pain were intractable could be considered.  Ms. Andary was struggling with anxiety and referred to psychiatry. Doxazosin was increased, and plans were made to refer her to the Target BP study at [facility]. She was struggling with volume overload and torsemide was increased.  Her weight was up 10 lb.  She was also given metolazone.  BNP at that time was 41.9. She followed up with Laurann Montana, NP 05/2021 and was not taking doxazosin because she thought it was replaced by diltiazem. Imdur was stopped due to headaches. She was advised to resume doxazosin.  At the last visit her blood pressure remained poorly controlled.  HCTZ was switched to chlorthalidone.  She was referred for the renal denervation study but denied due to being on too many blood pressure medications.  While on her prescribed blood pressure medications her average daytime blood pressure was 135/68.  There was 117/60  1 at night.  On average her 24-hour blood pressure was 127/65.  During the whitecoat.  It was 182/96.  During her work-up, she followed up with Laurann Montana, NP on 08/2021  Ms. Carsey has not been able to exercise recently due to a fall three weeks ago, which resulted in a bruised leg that is still healing. She also experienced another fall from her bed, causing discomfort in her back. The patient reports some fluid retention, particularly in her chest, which made it difficult for her to breathe. She took metolazone to alleviate the fluid buildup, which helped. The patient's weight has been increasing over the past six months, and she  has been advised to eat more frequent, smaller meals with increased protein intake to help manage her weight.  She uses a BiPAP machine for sleep apnea but reports feeling uncomfortable while using it, likening the sensation to being in a casket. Despite this, she tries to use it every night and finds it helpful during days with difficult breathing.  Past Medical History:  Diagnosis Date   Allergy    Anxiety    Apnea, sleep 05/04/2014   AHI 31/hr now on CPAP at 12cm H2O   Arthritis    Asthma    Asthma without status asthmaticus 01/19/2016   Axillary hidradenitis suppurativa 08/01/2011   Blood transfusion without reported diagnosis    Breast cancer ([place]) 2010   a. locally advanced left breast carcinoma diagnosed in 2010 and treated with neoadjuvant chemotherapy with docetaxel and cyclophosphamide as well as paclitaxel. This was followed by radiation therapy which was completed in 2011.   Chemotherapy-induced peripheral neuropathy ([place]) 12/09/2016   CHF (congestive heart failure) (HCC)    Chronic diastolic CHF (congestive heart failure) ([place]) 06/23/2013   Chronic kidney disease    Chronic pain in left foot 06/29/2018   Chronic stable angina ([place]) 08/22/2020   Cough variant asthma 09/27/2010   Followed in Pulmonary clinic/ [facility]/ [place]  Onset reported in  childhood  - PFT's 01/31/2011 FEV1  1.76 (60% ) ratio 68 and no better p B2,  DLCO 70% -med review 09/25/2012 > [place] drug assistance  paperwork given    - HFA 90% 11/20/2012 .  -Med calendar 12/07/12 , 04/06/2013 , 04/18/2014,, 08/30/2014 , 11/08/2014  - PFT's 01/12/2013  1.73 (65%)  Ratio 73 and no better p B2, DLCO 86% - trial off    Depression with somatization 06/15/2015   Dyspnea on exertion    a. 01/2013 Lexi MV: EF 54%, no ischemia/infarct;  b. 05/2013 Echo: EF 60-65%, no rwma, Gr 2 DD;  b.    Essential hypertension 10/09/2014   Estimated Creatinine Clearance: 82.4 mL/min (by C-G formula based on SCr of 0.98 mg/dL).   Fatty liver    Gastroparesis    GERD (gastroesophageal reflux disease)    Hyperglycemia 04/13/2011   Hypertension    Hypertensive heart disease 09/26/2015   Hypertriglyceridemia without hypercholesterolemia 06/15/2015   F[place]ingham risk score is 1%    Idiopathic intracranial hypertension 10/21/2014   Impaired glucose tolerance 04/13/2011   Insomnia secondary to anxiety 04/05/2015   Lymphadenitis, chronic    restricted LEFT extremity   Malignant neoplasm of upper-outer quadrant of left breast in female, estrogen receptor negative ([place]) 01/07/2013   Stage III, Invasive Ductal, s/p partial Left Mastectomy/Lumpectomy (baseball sized) with Lymph Node dissection, had Chemo and RadRx     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[place]) 09/27/2010   OSA (obstructive sleep apnea) 05/04/2014   AHI 31/hr now on CPAP at 12cm H2O   Oxygen deficiency    Uses 02 in CPAP   Persistent proteinuria 03/31/2018   Personal history of chemotherapy 2010   Personal history of radiation therapy 2010   Resistant hypertension 09/17/2011       Sleep apnea    SVT (supraventricular tachycardia) ([place]) 08/22/2020   Tibialis posterior tendinitis, left 07/15/2018   Injected in March 10, 2019   Upper airway cough syndrome 10/09/2014   Onset around 2012 with assoc pseudowheeze MRI 05/18/15 > Paranasal sinuses are clear - 11/17/2015 rec  increase h1 to 2 hs to help with noct cough  - Allergy profile 11/17/2015 >  Eos  0.1 /  IgE  80 POS RAST  to grass, ragweed, cedar tree  - d/c acei 03/06/2018  - flare end of Jan 2020 in setting of ? Uri/ poorly controlled GERD - flare mid Oct 2020 with uri / assoc secondary gerd   Vitamin D deficiency disease 03/31/2018    Past Surgical History:  Procedure Laterality Date   BRAIN SURGERY     [place]   BREAST LUMPECTOMY Left 09/2008   CARPAL TUNNEL RELEASE Left 2011   LYMPH NODE DISSECTION Left 2010   breast; 2 wks after breast lumpectomy   RIGHT/LEFT HEART CATH AND CORONARY ANGIOGRAPHY N/A 05/04/2019   Procedure: RIGHT/LEFT HEART CATH AND CORONARY ANGIOGRAPHY;  Surgeon: Jolaine Artist, MD;  Location: [facility];  Service: Cardiovascular;  Laterality: N/A;  Renal injection preformed    RIGHT/LEFT HEART CATH AND CORONARY ANGIOGRAPHY N/A 10/25/2020   Procedure: RIGHT/LEFT HEART CATH AND CORONARY ANGIOGRAPHY;  Surgeon: Leonie Man, MD;  Location: [facility];  Service: Cardiovascular;  Laterality: N/A;   TENDON RELEASE Right 2012   "hand"   TUBAL LIGATION  05/11/2011   Procedure: POST PARTUM TUBAL LIGATION;  Surgeon: Emeterio Reeve, MD;  Location: [facility];  Service: Gynecology;  Laterality: Bilateral;  Induced for HTN    Current Medications: Current Meds  Medication Sig   acetaZOLAMIDE (DIAMOX) 250 MG tablet Take 4 tablets (1,000 mg total) by mouth 3 (three) times daily.   albuterol (PROVENTIL) (2.5 MG/3ML) 0.083% nebulizer solution USE 1 VIAL VIA NEBULIZER EVERY 4 HOURS AND AS NEEDED FOR WHEEZING OR SHORTNESS OF BREATH   albuterol (VENTOLIN HFA) 108 (90 Base) MCG/ACT inhaler TAKE 2 PUFFS BY MOUTH EVERY 6 HOURS AS NEEDED FOR WHEEZE OR SHORTNESS OF BREATH   AMBULATORY NON FORMULARY MEDICATION Carpal Tunnel Wrist brace. Rt  Carpal Tunnel Syndrome G56.01  Disp 1   AMBULATORY NON FORMULARY MEDICATION Lace up ankle brace (ASO type)  Ankle sprain S93.491A  Disp 1    amitriptyline (ELAVIL) 100 MG tablet TAKE 1 TABLET BY MOUTH EVERYDAY AT BEDTIME   amLODipine (NORVASC) 10 MG tablet TAKE 1 TABLET BY MOUTH EVERY DAY   Biotin 5000 MCG TABS Take 5,000 mg by mouth 2 (two) times daily.    budesonide-formoterol (SYMBICORT) 160-4.5 MCG/ACT inhaler INHALE 2 PUFFS INTO LUNGS 2 TIMES DAILY AS DIRECTED   chlorthalidone (HYGROTON) 25 MG tablet Take 1 tablet (25 mg total) by mouth daily.   Cholecalciferol 1.25 MG (50000 UT) capsule Take 1 capsule (50,000 Units total) by mouth once a week. (Patient taking differently: Take 4,000 Units by mouth daily. 2000 mg each  gummies)   cloNIDine (CATAPRES - DOSED IN MG/24 HR) 0.3 mg/24hr patch PLACE 2 PATCHES ONTO SKIN ONCE WEEKLY   clopidogrel (PLAVIX) 75 MG tablet Take 1 tablet (75 mg total) by mouth daily.   clotrimazole (MYCELEX) 10 MG troche TAKE 1 TABLET BY MOUTH 5 TIMES DAILY.   COVID-19 mRNA bivalent vaccine, Moderna, (MODERNA COVID-19 BIVAL BOOSTER) 50 MCG/0.5ML injection Inject into the muscle.   dexlansoprazole (DEXILANT) 60 MG capsule Take 1 capsule (60 mg total) by mouth daily. (Patient taking differently: Take 60 mg by mouth daily. Pt taking 90 MG)   dextromethorphan-guaiFENesin (MUCINEX DM) 30-600 MG 12hr tablet Take 2 tablets by mouth 2 (two) times daily.   diltiazem (CARDIZEM CD) 240 MG 24 hr capsule TAKE 1 CAPSULE BY MOUTH EVERY DAY   doxazosin (CARDURA) 8 MG tablet TAKE 2 TABLETS AT BEDTIME   DULoxetine (CYMBALTA) 30 MG  capsule TAKE 1 CAPSULE (30 MG TOTAL) BY MOUTH DAILY. WITH '60MG'$  FOR TOTAL OF 90 MG (Patient taking differently: Take 30 mg by mouth See admin instructions. With '60mg'$  for total of 90 mg)   DULoxetine (CYMBALTA) 60 MG capsule TAKE 1 CAPSULE (60 MG TOTAL) BY MOUTH DAILY. FOR TOTAL OF 90 MG (Patient taking differently: Take 60 mg by mouth See admin instructions. Take with 30 mg for a total of 90 mg in the morning)   ENTRESTO 97-103 MG TAKE 1 TABLET BY MOUTH TWICE A DAY (Patient taking differently: Pt taking  once a day)   EPINEPHRINE 0.3 mg/0.3 mL IJ SOAJ injection INJECT 0.3 MLS INTO THE MUSCLE ONCE AS NEEDED FOR ANAPHYLAXIS (Patient taking differently: Inject 0.3 mg into the muscle as needed for anaphylaxis.)   ezetimibe (ZETIA) 10 MG tablet Take 1 tablet (10 mg total) by mouth daily.   famotidine (PEPCID) 20 MG tablet Take 1-2 tablets (20-40 mg total) by mouth at bedtime. (Patient taking differently: Take 40 mg by mouth at bedtime.)   gabapentin (NEURONTIN) 300 MG capsule Take 1 capsule (300 mg total) by mouth 3 (three) times daily. One three times daily (Patient taking differently: Take 300 mg by mouth 3 (three) times daily.)   hydrALAZINE (APRESOLINE) 50 MG tablet Take 1 tablet (50 mg total) by mouth 3 (three) times daily.   lidocaine-prilocaine (EMLA) cream Apply 1 application topically every 28 (twenty-eight) days. Apply 1 application to skin ever 28 days   metoCLOPramide (REGLAN) 10 MG tablet TAKE 1 TABLET BY MOUTH 4 TIMES DAILY. (Patient taking differently: Take 10 mg by mouth 4 (four) times daily -  before meals and at bedtime.)   metolazone (ZAROXOLYN) 2.5 MG tablet Take 1 tablet (2.5 mg total) by mouth once a week.   minoxidil (LONITEN) 2.5 MG tablet TAKE 2 TABLETS (5 MG TOTAL) BY MOUTH 2 (TWO) TIMES DAILY   montelukast (SINGULAIR) 10 MG tablet TAKE 1 TABLET BY MOUTH EVERYDAY AT BEDTIME   Nebivolol HCl 20 MG TABS TAKE 2 TABLETS BY MOUTH EVERY DAY   nitroGLYCERIN (NITROSTAT) 0.4 MG SL tablet Place 1 tablet (0.4 mg total) under the tongue every 5 (five) minutes as needed for chest pain.   oxymetazoline (MUCINEX SINUS-MAX FULL FORCE) 0.05 % nasal spray Place 1 spray into both nostrils 2 (two) times daily.   Potassium Chloride ER 20 MEQ TBCR Take 2 tablets by mouth 2 (two) times daily.   primidone (MYSOLINE) 50 MG tablet Take 0.5 tablets (25 mg total) by mouth at bedtime.   promethazine (PHENERGAN) 25 MG tablet TAKE 1 TABLET (25 MG TOTAL) BY MOUTH 3 (THREE) TIMES DAILY AS NEEDED FOR NAUSEA OR  VOMITING.   promethazine-dextromethorphan (PROMETHAZINE-DM) 6.25-15 MG/5ML syrup TAKE 5 MLS BY MOUTH 4 TIMES A DAY AS NEEDED FOR COUGH   ranolazine (RANEXA) 1000 MG SR tablet TAKE 1 TABLET BY MOUTH TWICE A DAY   Respiratory Therapy Supplies (FLUTTER) DEVI Use as directed   rosuvastatin (CRESTOR) 40 MG tablet Take 1 tablet (40 mg total) by mouth daily.   Spacer/Aero-Holding Chambers (AEROCHAMBER MV) inhaler Use as instructed   spironolactone (ALDACTONE) 100 MG tablet Take 1 tablet (100 mg total) by mouth daily.   torsemide (DEMADEX) 20 MG tablet Take 2 tablets (40 mg total) by mouth 2 (two) times daily.   [DISCONTINUED] Evolocumab (REPATHA SURECLICK) 937 MG/ML SOAJ Inject 1 mL into the skin every 14 (fourteen) days.   Current Facility-Administered Medications for the 12/04/21 encounter (Office Visit) with Skeet Latch,  MD  Medication   sodium chloride flush (NS) 0.9 % injection 3 mL     Allergies:   Aspirin, Doxycycline, Kiwi extract, Metoprolol, Penicillins, Strawberry extract, and Sulfamethoxazole-trimethoprim   Social History   Socioeconomic History   Marital status: Single    Spouse name: Not on file   Number of children: 3   Years of education: Not on file   Highest education level: Not on file  Occupational History    Employer: UNEMPLOYED  Tobacco Use   Smoking status: Never   Smokeless tobacco: Never  Vaping Use   Vaping Use: Never used  Substance and Sexual Activity   Alcohol use: Yes    Alcohol/week: 0.0 standard drinks of alcohol    Comment: occasionally/socially   Drug use: No   Sexual activity: Yes    Birth control/protection: Surgical  Other Topics Concern   Not on file  Social History Narrative   She lives with four children.   She is currently not working (disbaility pending).   Left handed   One story home   Drinks caffeine      Social Determinants of Health   Financial Resource Strain: Medium Risk (03/03/2020)   Overall Financial Resource Strain  (CARDIA)    Difficulty of Paying Living Expenses: Somewhat hard  Food Insecurity: No Food Insecurity (04/11/2020)   Hunger Vital Sign    Worried About Running Out of Food in the Last Year: Never true    Ran Out of Food in the Last Year: Never true  Transportation Needs: No Transportation Needs (02/29/2020)   PRAPARE - Hydrologist (Medical): No    Lack of Transportation (Non-Medical): No  Physical Activity: Not on file  Stress: Stress Concern Present (06/16/2020)   [place]    Feeling of Stress : Rather much  Social Connections: Not on file     Family History: The patient's family history includes Breast cancer in some other family members; CAD in her maternal grandmother; Diabetes in her maternal grandmother; Fibroids (age of onset: 74) in her sister; Heart disease in her maternal grandmother; Hypertension in her maternal grandmother; Other (age of onset: 8) in her mother. There is no history of Cancer, Alcohol abuse, Early death, Hyperlipidemia, Kidney disease, Stroke, Colon cancer, Esophageal cancer, Stomach cancer, Rectal cancer, or Colon polyps.  ROS:   Please see the history of present illness.    (+) Right shoulder pain (+) Shortness of breath (+) Bilateral LE edema All other systems reviewed and are negative.  EKGs/Labs/Other Studies Reviewed:    Echo 04/05/2021: Sonographer Comments: Patient is morbidly obese. Image acquisition  challenging due to patient body habitus.  IMPRESSIONS    1. Left ventricular ejection fraction, by estimation, is 55 to 60%. The  left ventricle has normal function. The left ventricle has no regional  wall motion abnormalities. There is mild left ventricular hypertrophy.  Left ventricular diastolic parameters  were normal. The average left ventricular global longitudinal strain is  -15.0 %. The global longitudinal strain is abnormal.   2. Right  ventricular systolic function is normal. The right ventricular  size is mildly enlarged. There is mildly elevated pulmonary artery  systolic pressure. The estimated right ventricular systolic pressure is  10.6 mmHg.   3. Left atrial size was moderately dilated.   4. Right atrial size was moderately dilated.   5. The mitral valve is normal in structure. Mild mitral valve  regurgitation. No evidence  of mitral stenosis.   6. The aortic valve is normal in structure. Aortic valve regurgitation is  not visualized. No aortic stenosis is present.   7. The inferior vena cava is normal in size with greater than 50%  respiratory variability, suggesting right atrial pressure of 3 mmHg.   Comparison(s): EF 45%, moderate LVH, GLS -13.1%.   Right/Left Heart Cath 10/25/2020:   Dist LAD lesion is 80% stenosed.   LPAV lesion is 30% stenosed with 50% stenosed side branch in 1st LPL.   There is hyperdynamic left ventricular systolic function.  The left ventricular ejection fraction is greater than 65% by visual estimate.   Hemodynamic findings consistent with mild (secondary) pulmonary hypertension - LVEDP ~25 mmHg, PCWP ~19-20 mmHg   Normal Cardiac Output / Index (7.91 / 3.94)   POST-OPERATIVE DIAGNOSIS:              Stable single-vessel CAD with apical LAD 80% stenosis-no change from previous cath.  Otherwise very tortuous coronary arteries with no significant stenoses.            Mild (likely secondary) Pulmonary Hypertension with PA mean pressure of 28 mmHg (PAP 39/15 mmHg), and PCWP of 19 mmHg with LVEDP of 25 mmHg.            Significant systolic hypertension with initial pressures in the 200s over 110s, however after medication 152/78 mmHg with MAP of 109 mmHg; LVP/EDP 155/13 mmHg - 25 mmHg.            Ao sat 99%, PA sat 81%.  Fick Cardiac Output-Index: 7.91-3.84 (normal)            Suspect angina is due to accelerated hypertension, increased wall stress and microvascular ischemia.  Distal LAD  lesion is not favorable for PCI (however if pain is intractable, could be considered)  Diagnostic Dominance: Right    Monitor 03/23/2020: 3 Day Zio Monitor   Quality: Fair.  Baseline artifact. Predominant rhythm: sinus Average heart rate: 84 bpm Max heart rate: 140 bpm Min heart rate: 57 bpm Pauses >2.5 seconds: none Occasional PVCs and ventricular bigeminy Two runs of SVT up to 8 beats.  Rate 224 bpm.    LHC/RHC 05/04/19: Dist LAD-1 lesion is 30% stenosed. Dist LAD-2 lesion is 80% stenosed. Dist Cx lesion is 60% stenosed.   Findings:   Ao = 173/104 (134) LV =  181/25 RA =  10 RV =  41/13 PA =  42/10 (26) PCW = 18 Fick cardiac output/index = 5.8/2.9 PVR = 1.4 wu Ao sat = 98% PA sat = 73%, 73%   Assessment: 1. 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 LVEF 55-60% 3. Mildly elevated filling pressures with mild pulmonary venous HTN m4. Widely patent bilateral renal arteries. No evidence of AAA 5. Severe systemic HTN   Renal artery Doppler 10/29/18: Right: 1-59% stenosis of the right renal artery. Cyst(s) noted.  Left:  1-59% stenosis of the left renal artery.    Lexiscan Myoview 04/20/18: The left ventricular ejection fraction is moderately decreased (30-44%). Nuclear stress EF: 41%. There was no ST segment deviation noted during stress. This is an intermediate risk study.   Abnormal, intermediate risk stress nuclear study with no ischemia or infarction; EF 41 with global hypokinesis and mild LVE; study intermediate risk due to reduced LV function.  Echo 03/12/18: Study Conclusions  - Left ventricle: The cavity size was normal. Wall thickness was    increased in a pattern of moderate LVH. Systolic function was  mildly reduced. The estimated ejection fraction was in the range    of 45% to 50%. Wall motion was normal; there were no regional    wall motion abnormalities. Features are consistent with a    pseudonormal left ventricular filling pattern, with  concomitant    abnormal relaxation and increased filling pressure (grade 2    diastolic dysfunction).  - Mitral valve: There was mild regurgitation.  - Pulmonary arteries: Systolic pressure was mildly increased. PA    peak pressure: 33 mm Hg (S).   EKG:   07/16/2021: EKG was not ordered. 03/01/2021: EKG was not ordered. 11/27/2020: EKG was not ordered. 08/22/2020: EKG was not ordered. 06/16/2020: EKG was not ordered.   04/23/19: sinus tachycardia.  Rate 124 bpm.  LVH  Recent Labs: 05/17/2021: BNP 67.4 06/25/2021: TSH 1.63 09/17/2021: ALT 58; BUN 7; Creatinine, Ser 0.86; Hemoglobin 14.3; Magnesium 2.2; Platelets 222; Potassium 3.3; Sodium 145   Recent Lipid Panel    Component Value Date/Time   CHOL 184 09/17/2021 1001   TRIG 177 (H) 09/17/2021 1001   HDL 39 (L) 09/17/2021 1001   CHOLHDL 4.7 (H) 09/17/2021 1001   CHOLHDL 4 10/01/2018 1115   VLDL 22.6 10/01/2018 1115   LDLCALC 114 (H) 09/17/2021 1001   LDLDIRECT 115 (H) 09/17/2021 1001   LDLDIRECT 152.0 03/30/2018 1604    Physical Exam:    Wt Readings from Last 3 Encounters:  12/04/21 220 lb (99.8 kg)  10/04/21 216 lb (98 kg)  09/17/21 219 lb (99.3 kg)    VS:  BP (!) 208/78   Pulse 91   Ht '5\' 5"'$  (1.651 m)   Wt 220 lb (99.8 kg)   LMP  (LMP Unknown)   BMI 36.61 kg/m  , BMI Body mass index is 36.61 kg/m. GENERAL:  Well appearing HEENT: Pupils equal round and reactive, fundi not visualized, oral mucosa unremarkable NECK:  No jugular venous distention, waveform within normal limits, carotid upstroke brisk and symmetric, no bruits LUNGS:  Clear to auscultation bilaterally HEART:  RRR.  PMI not displaced or sustained,S1 and S2 within normal limits, no S3, no S4, no clicks, no rubs, no murmurs ABD:  Non-tender.  positive bowel sounds normal in frequency in pitch, no bruits, no rebound, no guarding, no midline pulsatile mass, no hepatomegaly, no splenomegaly EXT:  2 plus pulses throughout, no edema , no cyanosis no clubbing SKIN:   No rashes no nodules; Blistering lesion on left thigh. NEURO:  Cranial nerves II through XII grossly intact, motor grossly intact throughout PSYCH:  Cognitively intact, oriented to person place and time   ASSESSMENT:    1. Essential hypertension   2. Chronic stable angina ([place])   3. Hyperlipidemia LDL goal <100   4. Coronary artery disease of native artery of native heart with stable angina pectoris [facility])     PLAN:    No problem-specific Assessment & Plan notes found for this encounter.  #Resistant Hypertension - Patient is currently on multiple antihypertensive medications, but blood pressure remains elevated.   Her BP was surprisingly controlled on the 24 hour BP monitor she wore for the trial.  It was different from what she usually gets at home, calling into question the accuracy of the data.  There is a possibility of white coat hypertension superimposed on resistant hypertension. Plan to redo 24-hour blood pressure monitor with a different machine to confirm the findings. If the results are consistent, consider the possibility of white coat syndrome and discuss further management options.  Continue amlodipine, chlorthalidone, clonidine, diltiazem, doxazosin, hydralazine, minoxidil, nebivolol, Entresto, and spironolactone. There is nothing else to add at this time.  We will refer her for renal denervation once FDA approved.   HYPERTENSION CONTROL Vitals:   12/04/21 0938 12/04/21 1304  BP: (!) 182/84 (!) 208/78    The patient's blood pressure is elevated above target today.  In order to address the patient's elevated BP: The blood pressure is usually elevated in clinic.  Blood pressures monitored at home have been optimal.      # Chronic diastolic HF:  Euvolemic on exam.  BP management as above with torsemide as well.  She does well with prn metolazone when volume overloaded.   # Hyperlipidemia: Continue Repatha, Zetia, and rosuvastatin.  She will come back for fasting lipids  and a CMP.  LDL goal is less than 70.  # BiPAP compliance - Patient is using BiPAP but reports feeling uncomfortable and sometimes removing the mask during the night. Encourage the patient to continue using BiPAP as prescribed and discuss any concerns with their sleep specialist.    Disposition: FU with APP in 4 months.  Medication Adjustments/Labs and Tests Ordered: Current medicines are reviewed at length with the patient today.  Concerns regarding medicines are outlined above.   Orders Placed This Encounter  Procedures   Lipid panel   Comprehensive metabolic panel   24 hour blood pressure monitor   Meds ordered this encounter  Medications   Evolocumab (REPATHA SURECLICK) 254 MG/ML SOAJ    Sig: Inject 1 mL into the skin every 14 (fourteen) days.    Dispense:  6 mL    Refill:  3    Signed, Skeet Latch, MD  12/04/2021 1:06 PM    [facility]

## 2021-12-04 NOTE — Patient Instructions (Addendum)
Medication Instructions:  Your physician recommends that you continue on your current medications as directed. Please refer to the Current Medication list given to you today.   *If you need a refill on your cardiac medications before your next appointment, please call your pharmacy*  Lab Work: LIPID/CMET TODAY   Testing/Procedures: 24 HOUR BLOOD PRESSURE MONITOR  THE OFFICE WILL CALL YOU TO ARRANGE   Follow-Up: At [facility], you and your health needs are our priority.  As part of our continuing mission to provide you with exceptional heart care, we have created designated Provider Care Teams.  These Care Teams include your primary Cardiologist (physician) and Advanced Practice Providers (APPs -  Physician Assistants and Nurse Practitioners) who all work together to provide you with the care you need, when you need it.  We recommend signing up for the patient portal called "MyChart".  Sign up information is provided on this After Visit Summary.  MyChart is used to connect with patients for Virtual Visits (Telemedicine).  Patients are able to view lab/test results, encounter notes, upcoming appointments, etc.  Non-urgent messages can be sent to your provider as well.   To learn more about what you can do with MyChart, go to [email or web address].    Your next appointment:   4 month(s)  The format for your next appointment:   In Person  Provider:   Skeet Latch, MD

## 2021-12-05 ENCOUNTER — Other Ambulatory Visit (HOSPITAL_BASED_OUTPATIENT_CLINIC_OR_DEPARTMENT_OTHER): Payer: Self-pay | Admitting: Cardiovascular Disease

## 2021-12-05 DIAGNOSIS — E876 Hypokalemia: Secondary | ICD-10-CM

## 2021-12-05 NOTE — Telephone Encounter (Signed)
Rx request sent to pharmacy.  

## 2021-12-06 ENCOUNTER — Encounter: Payer: Self-pay | Admitting: Adult Health

## 2021-12-06 ENCOUNTER — Inpatient Hospital Stay (HOSPITAL_BASED_OUTPATIENT_CLINIC_OR_DEPARTMENT_OTHER): Payer: Medicare Other | Admitting: Adult Health

## 2021-12-06 ENCOUNTER — Inpatient Hospital Stay: Payer: Medicare Other | Attending: Adult Health

## 2021-12-06 ENCOUNTER — Other Ambulatory Visit: Payer: Self-pay

## 2021-12-06 ENCOUNTER — Other Ambulatory Visit (HOSPITAL_COMMUNITY): Payer: Self-pay

## 2021-12-06 VITALS — BP 192/90 | HR 80 | Temp 97.9°F | Resp 18 | Ht 65.0 in | Wt 218.4 lb

## 2021-12-06 DIAGNOSIS — Z923 Personal history of irradiation: Secondary | ICD-10-CM | POA: Diagnosis not present

## 2021-12-06 DIAGNOSIS — G932 Benign intracranial hypertension: Secondary | ICD-10-CM | POA: Diagnosis not present

## 2021-12-06 DIAGNOSIS — Z1211 Encounter for screening for malignant neoplasm of colon: Secondary | ICD-10-CM

## 2021-12-06 DIAGNOSIS — I130 Hypertensive heart and chronic kidney disease with heart failure and stage 1 through stage 4 chronic kidney disease, or unspecified chronic kidney disease: Secondary | ICD-10-CM | POA: Insufficient documentation

## 2021-12-06 DIAGNOSIS — C50412 Malignant neoplasm of upper-outer quadrant of left female breast: Secondary | ICD-10-CM | POA: Diagnosis present

## 2021-12-06 DIAGNOSIS — Z1231 Encounter for screening mammogram for malignant neoplasm of breast: Secondary | ICD-10-CM | POA: Diagnosis not present

## 2021-12-06 DIAGNOSIS — Z171 Estrogen receptor negative status [ER-]: Secondary | ICD-10-CM | POA: Insufficient documentation

## 2021-12-06 DIAGNOSIS — Z79818 Long term (current) use of other agents affecting estrogen receptors and estrogen levels: Secondary | ICD-10-CM | POA: Diagnosis present

## 2021-12-06 DIAGNOSIS — N938 Other specified abnormal uterine and vaginal bleeding: Secondary | ICD-10-CM | POA: Insufficient documentation

## 2021-12-06 DIAGNOSIS — Z8249 Family history of ischemic heart disease and other diseases of the circulatory system: Secondary | ICD-10-CM | POA: Insufficient documentation

## 2021-12-06 DIAGNOSIS — Z9012 Acquired absence of left breast and nipple: Secondary | ICD-10-CM | POA: Insufficient documentation

## 2021-12-06 DIAGNOSIS — Z79899 Other long term (current) drug therapy: Secondary | ICD-10-CM | POA: Insufficient documentation

## 2021-12-06 MED ORDER — BOOSTRIX 5-2.5-18.5 LF-MCG/0.5 IM SUSY
PREFILLED_SYRINGE | INTRAMUSCULAR | 0 refills | Status: DC
Start: 2021-12-06 — End: 2022-11-20
  Filled 2021-12-06: qty 0.5, 1d supply, fill #0

## 2021-12-06 MED ORDER — GOSERELIN ACETATE 3.6 MG ~~LOC~~ IMPL
3.6000 mg | DRUG_IMPLANT | SUBCUTANEOUS | Status: DC
  Administered 2021-12-06: 3.6 mg via SUBCUTANEOUS
  Filled 2021-12-06: qty 3.6

## 2021-12-06 NOTE — Assessment & Plan Note (Addendum)
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  Erica [name] has no clinical or radiographic sign of breast cancer recurrence.  She is overdue for her mammogram and I placed orders for that today.  We also discussed other cancer screening and I sent her back over to J Pearl's office to see if he wants to do colonoscopy on her at this time.  She continues on Zoladex for her dysfunctional uterine bleeding.  The initial plan is for her to receive the therapy until she turns 46 years old at which point she will be tested to evaluate whether she is in menopause.  Considering her malignant hypertension that has been persistent I sent Skeet Latch a question asking whether she thinks the Zoladex could be contributing to the elevated blood pressure.  For now Erica [name] will continue on the Zoladex and she will return every 4 weeks unless I receive a different recommendation from Dr. Oval Linsey.  We will see her back in a year.

## 2021-12-06 NOTE — Progress Notes (Signed)
Erica [name] Cancer Follow up:    Pcp, No No address on file   DIAGNOSIS:  Cancer Staging  Malignant neoplasm of upper-outer quadrant of left breast in female, estrogen receptor negative ([place]) Staging form: Breast, AJCC 7th Edition - Clinical: Stage IIB (T2, N1, M0) - Signed by Chauncey Cruel, MD on 10/20/2014   SUMMARY OF ONCOLOGIC HISTORY: 46 y.o. Erica [name] woman with history of locally advanced left breast carcinoma initially presenting in March 2010    (1) treated neoadjuvantly with 4 cycles of docetaxel/ cyclophosphamide.     (2) status post left lumpectomy and sentinel lymph node dissection in July 2010 for a ypT2 ypN1, stage IIB invasive ductal carcinoma, grade 3, triple negative, with MIB-1 of 88%.    (3) status post full axillary dissection and margin clearance in August 2010.    (4) status post 11 doses of weekly paclitaxel completed in December 2010    (5) radiation therapy completed in March 2011; now followed with observation alone.    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 female returns for f/u of her history of breast cancer.  Her most recent mammogram was completed in June 2022 which was normal, she has not yet had her annual mammogram.  She also tells me she has not undergone any colon cancer screening and Dr. Hilarie [name] had this set up for her however she had a death in the family and so she had to postpone it and has yet to have it rescheduled.  We discussed her blood pressure being so elevated.  She is seeing Dr. Oval [name] for this.  Her blood pressure was elevated  prior to beginning the Zoladex and she has not noticed any improvements or changes or worsening in her blood pressure since starting the Zoladex.  She denies any symptoms from her elevated blood pressure including headaches vision changes weakness numbness or other concerns.   Patient Active Problem List   Diagnosis Date Noted   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   Coronary artery disease    Chronic stable angina ([place]) 08/22/2020   SVT (supraventricular tachycardia) ([place]) 08/22/2020   Palpitations 02/28/2020   Right ankle pain 04/14/2019   OSA (obstructive sleep apnea) 10/01/2018   Tibialis posterior tendinitis, left 07/15/2018   Chronic pain in left foot 06/29/2018   Vitamin D deficiency disease 03/31/2018   Persistent proteinuria 03/31/2018   Hyperlipidemia LDL goal <100 03/30/2018   Chemotherapy-induced peripheral neuropathy ([place]) 12/09/2016   Asthma in adult, mild persistent, uncomplicated 15/17/6160   Hypertensive heart disease 09/26/2015   Depression with somatization 06/15/2015   Hypertriglyceridemia without hypercholesterolemia 06/15/2015   Hypokalemia 06/15/2015   Insomnia secondary to anxiety 04/05/2015   Idiopathic intracranial hypertension 10/21/2014   Upper airway cough syndrome 10/09/2014   Malignant hypertension 10/09/2014   Morbid obesity due to excess calories ([place]) 10/09/2014   Apnea, sleep 05/04/2014   Acute on chronic diastolic CHF (congestive heart failure), NYHA class 2 ([place]) 05/03/2014   DOE (dyspnea on exertion) 07/14/2013   Acute on chronic diastolic heart failure ([place]) 06/23/2013   Malignant neoplasm of upper-outer quadrant of left breast in female, estrogen receptor negative ([place]) 01/07/2013  Gastroparesis 01/04/2013   Resistant hypertension 09/17/2011   Axillary hidradenitis suppurativa 08/01/2011   Preventative health care 04/13/2011   Hyperglycemia 04/13/2011   GERD (gastroesophageal reflux disease) 03/18/2011   Cough variant asthma 09/27/2010    is  allergic to aspirin, doxycycline, kiwi extract, metoprolol, penicillins, strawberry extract, and sulfamethoxazole-trimethoprim.  MEDICAL HISTORY: Past Medical History:  Diagnosis Date   Allergy    Anxiety    Apnea, sleep 05/04/2014   AHI 31/hr now on CPAP at 12cm H2O   Arthritis    Asthma    Asthma without status asthmaticus 01/19/2016   Axillary hidradenitis suppurativa 08/01/2011   Blood transfusion without reported diagnosis    Breast cancer ([place]) 2010   a. locally advanced left breast carcinoma diagnosed in 2010 and treated with neoadjuvant chemotherapy with docetaxel and cyclophosphamide as well as paclitaxel. This was followed by radiation therapy which was completed in 2011.   Chemotherapy-induced peripheral neuropathy ([place]) 12/09/2016   CHF (congestive heart failure) (HCC)    Chronic diastolic CHF (congestive heart failure) ([place]) 06/23/2013   Chronic kidney disease    Chronic pain in left foot 06/29/2018   Chronic stable angina ([place]) 08/22/2020   Cough variant asthma 09/27/2010   Followed in Pulmonary clinic/ [facility]/ [place]  Onset reported in childhood  - PFT's 01/31/2011 FEV1  1.76 (60% ) ratio 68 and no better p B2,  DLCO 70% -med review 09/25/2012 > [place] drug assistance  paperwork given    - HFA 90% 11/20/2012 .  -Med calendar 12/07/12 , 04/06/2013 , 04/18/2014,, 08/30/2014 , 11/08/2014  - PFT's 01/12/2013  1.73 (65%)  Ratio 73 and no better p B2, DLCO 86% - trial off    Depression with somatization 06/15/2015   Dyspnea on exertion    a. 01/2013 Lexi MV: EF 54%, no ischemia/infarct;  b. 05/2013 Echo: EF 60-65%, no rwma, Gr 2 DD;  b.    Essential hypertension 10/09/2014   Estimated Creatinine Clearance: 82.4 mL/min (by C-G formula based on SCr of 0.98 mg/dL).   Fatty liver    Gastroparesis    GERD (gastroesophageal reflux disease)    Hyperglycemia 04/13/2011   Hypertension    Hypertensive heart disease 09/26/2015   Hypertriglyceridemia without hypercholesterolemia 06/15/2015    F[place]ingham risk score is 1%    Idiopathic intracranial hypertension 10/21/2014   Impaired glucose tolerance 04/13/2011   Insomnia secondary to anxiety 04/05/2015   Lymphadenitis, chronic    restricted LEFT extremity   Malignant neoplasm of upper-outer quadrant of left breast in female, estrogen receptor negative ([place]) 01/07/2013   Stage III, Invasive Ductal, s/p partial Left Mastectomy/Lumpectomy (baseball sized) with Lymph Node dissection, had Chemo and RadRx     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[place]) 09/27/2010   OSA (obstructive sleep apnea) 05/04/2014   AHI 31/hr now on CPAP at 12cm H2O   Oxygen deficiency    Uses 02 in CPAP   Persistent proteinuria 03/31/2018   Personal history of chemotherapy 2010   Personal history of radiation therapy 2010   Resistant hypertension 09/17/2011       Sleep apnea    SVT (supraventricular tachycardia) ([place]) 08/22/2020   Tibialis posterior tendinitis, left 07/15/2018   Injected in March 10, 2019   Upper airway cough syndrome 10/09/2014   Onset around 2012 with assoc pseudowheeze MRI 05/18/15 > Paranasal sinuses are clear - 11/17/2015 rec increase h1 to 2 hs to help with noct cough  - Allergy profile 11/17/2015 >  Eos 0.1 /  IgE  80  POS RAST  to grass, ragweed, cedar tree  - d/c acei 03/06/2018  - flare end of Jan 2020 in setting of ? Uri/ poorly controlled GERD - flare mid Oct 2020 with uri / assoc secondary gerd   Vitamin D deficiency disease 03/31/2018    SURGICAL HISTORY: Past Surgical History:  Procedure Laterality Date   BRAIN SURGERY     Shunt   BREAST LUMPECTOMY Left 09/2008   CARPAL TUNNEL RELEASE Left 2011   LYMPH NODE DISSECTION Left 2010   breast; 2 wks after breast lumpectomy   RIGHT/LEFT HEART CATH AND CORONARY ANGIOGRAPHY N/A 05/04/2019   Procedure: RIGHT/LEFT HEART CATH AND CORONARY ANGIOGRAPHY;  Surgeon: Jolaine Artist, MD;  Location: [facility];  Service: Cardiovascular;  Laterality: N/A;  Renal injection preformed    RIGHT/LEFT HEART CATH  AND CORONARY ANGIOGRAPHY N/A 10/25/2020   Procedure: RIGHT/LEFT HEART CATH AND CORONARY ANGIOGRAPHY;  Surgeon: Leonie Man, MD;  Location: [facility];  Service: Cardiovascular;  Laterality: N/A;   TENDON RELEASE Right 2012   "hand"   TUBAL LIGATION  05/11/2011   Procedure: POST PARTUM TUBAL LIGATION;  Surgeon: Emeterio Reeve, MD;  Location: [facility];  Service: Gynecology;  Laterality: Bilateral;  Induced for HTN    SOCIAL HISTORY: Social History   Socioeconomic History   Marital status: Single    Spouse name: Not on file   Number of children: 3   Years of education: Not on file   Highest education level: Not on file  Occupational History    Employer: UNEMPLOYED  Tobacco Use   Smoking status: Never   Smokeless tobacco: Never  Vaping Use   Vaping Use: Never used  Substance and Sexual Activity   Alcohol use: Yes    Alcohol/week: 0.0 standard drinks of alcohol    Comment: occasionally/socially   Drug use: No   Sexual activity: Yes    Birth control/protection: Surgical  Other Topics Concern   Not on file  Social History Narrative   She lives with four children.   She is currently not working (disbaility pending).   Left handed   One story home   Drinks caffeine      Social Determinants of Health   Financial Resource Strain: Medium Risk (03/03/2020)   Overall Financial Resource Strain (CARDIA)    Difficulty of Paying Living Expenses: Somewhat hard  Food Insecurity: No Food Insecurity (04/11/2020)   Hunger Vital Sign    Worried About Running Out of Food in the Last Year: Never true    Ran Out of Food in the Last Year: Never true  Transportation Needs: No Transportation Needs (02/29/2020)   PRAPARE - Hydrologist (Medical): No    Lack of Transportation (Non-Medical): No  Physical Activity: Not on file  Stress: Stress Concern Present (06/16/2020)   [place]    Feeling of  Stress : Rather much  Social Connections: Not on file  Intimate Partner Violence: Not on file    FAMILY HISTORY: Family History  Problem Relation Age of Onset   Other Mother 29       breast calcifications treated with surgery, breast cancer pill, and radiation   Fibroids Sister 68       s/p TAH-BSO   Diabetes Maternal Grandmother    Heart disease Maternal Grandmother    Hypertension Maternal Grandmother    CAD Maternal Grandmother    Breast cancer Other  maternal great aunt (MGM's sister)   Breast cancer Other        paternal great aunt dx middle ages; s/p mastectomy   Cancer Neg Hx    Alcohol abuse Neg Hx    Early death Neg Hx    Hyperlipidemia Neg Hx    Kidney disease Neg Hx    Stroke Neg Hx    Colon cancer Neg Hx    Esophageal cancer Neg Hx    Stomach cancer Neg Hx    Rectal cancer Neg Hx    Colon polyps Neg Hx     Review of Systems  Constitutional:  Negative for appetite change, chills, fatigue, fever and unexpected weight change.  HENT:   Negative for hearing loss, lump/mass and trouble swallowing.   Eyes:  Negative for eye problems and icterus.  Respiratory:  Negative for chest tightness, cough and shortness of breath.   Cardiovascular:  Negative for chest pain, leg swelling and palpitations.  Gastrointestinal:  Negative for abdominal distention, abdominal pain, constipation, diarrhea, nausea and vomiting.  Endocrine: Negative for hot flashes.  Genitourinary:  Negative for difficulty urinating.   Musculoskeletal:  Negative for arthralgias.  Skin:  Negative for itching and rash.  Neurological:  Negative for dizziness, extremity weakness, headaches and numbness.  Hematological:  Negative for adenopathy. Does not bruise/bleed easily.  Psychiatric/Behavioral:  Negative for depression. The patient is not nervous/anxious.       PHYSICAL EXAMINATION  ECOG PERFORMANCE STATUS: 0  Vitals:   12/06/21 0832 12/06/21 0910  BP: (!) 190/98 (!) 192/90  Pulse: 80    Resp: 18   Temp: 97.9 F (36.6 C)   SpO2: 100%     Physical Exam Constitutional:      General: She is not in acute distress.    Appearance: Normal appearance. She is not toxic-appearing.  HENT:     Head: Normocephalic and atraumatic.  Eyes:     General: No scleral icterus. Cardiovascular:     Rate and Rhythm: Normal rate and regular rhythm.     Pulses: Normal pulses.     Heart sounds: Normal heart sounds.  Pulmonary:     Effort: Pulmonary effort is normal.     Breath sounds: Normal breath sounds.  Chest:     Comments: Left breast s/p lumpectomy and radiation, no sign of local recurrence, right breast benign Abdominal:     General: Abdomen is flat. Bowel sounds are normal. There is no distension.     Palpations: Abdomen is soft.     Tenderness: There is no abdominal tenderness.  Musculoskeletal:        General: No swelling.     Cervical back: Neck supple.  Lymphadenopathy:     Cervical: No cervical adenopathy.  Skin:    General: Skin is warm and dry.     Findings: No rash.  Neurological:     General: No focal deficit present.     Mental Status: She is alert.  Psychiatric:        Mood and Affect: Mood normal.        Behavior: Behavior normal.     LABORATORY DATA:  CBC    Component Value Date/Time   WBC 4.8 09/17/2021 1001   WBC 6.3 12/07/2020 0808   RBC 4.83 09/17/2021 1001   RBC 4.76 12/07/2020 0808   HGB 14.3 09/17/2021 1001   HGB 13.8 03/17/2017 0835   HCT 43.7 09/17/2021 1001   HCT 41.8 03/17/2017 0835   PLT 222 09/17/2021 1001  MCV 91 09/17/2021 1001   MCV 88.0 03/17/2017 0835   MCH 29.6 09/17/2021 1001   MCH 29.0 12/07/2020 0808   MCHC 32.7 09/17/2021 1001   MCHC 32.8 12/07/2020 0808   RDW 13.7 09/17/2021 1001   RDW 14.5 03/17/2017 0835   LYMPHSABS 2.5 12/07/2020 0808   LYMPHSABS 2.3 02/28/2020 0841   LYMPHSABS 1.6 03/17/2017 0835   MONOABS 0.4 12/07/2020 0808   MONOABS 0.3 03/17/2017 0835   EOSABS 0.1 12/07/2020 0808   EOSABS 0.2  02/28/2020 0841   BASOSABS 0.0 12/07/2020 0808   BASOSABS 0.0 02/28/2020 0841   BASOSABS 0.1 03/17/2017 0835    CMP     Component Value Date/Time   NA 145 (H) 09/17/2021 1001   NA 142 03/17/2017 0835   K 3.3 (L) 09/17/2021 1001   K 3.6 03/17/2017 0835   CL 105 09/17/2021 1001   CL 103 09/10/2012 1618   CO2 24 09/17/2021 1001   CO2 27 03/17/2017 0835   GLUCOSE 117 (H) 09/17/2021 1001   GLUCOSE 104 (H) 12/07/2020 0808   GLUCOSE 114 03/17/2017 0835   GLUCOSE 86 09/10/2012 1618   BUN 7 09/17/2021 1001   BUN 10.1 03/17/2017 0835   CREATININE 0.86 09/17/2021 1001   CREATININE 1.27 (H) 12/09/2018 1222   CREATININE 0.9 03/17/2017 0835   CALCIUM 9.5 09/17/2021 1001   CALCIUM 9.5 03/17/2017 0835   PROT 7.4 09/17/2021 1001   PROT 7.7 03/17/2017 0835   ALBUMIN 4.3 09/17/2021 1001   ALBUMIN 3.7 03/17/2017 0835   AST 51 (H) 09/17/2021 1001   AST 24 12/09/2018 1222   AST 17 03/17/2017 0835   ALT 58 (H) 09/17/2021 1001   ALT 27 12/09/2018 1222   ALT 24 03/17/2017 0835   ALKPHOS 77 09/17/2021 1001   ALKPHOS 77 03/17/2017 0835   BILITOT 0.4 09/17/2021 1001   BILITOT 0.4 12/09/2018 1222   BILITOT 0.50 03/17/2017 0835   GFRNONAA >60 12/07/2020 0808   GFRNONAA 51 (L) 12/09/2018 1222   GFRAA 72 04/10/2020 0942   GFRAA 59 (L) 12/09/2018 1222       PENDING LABS:   RADIOGRAPHIC STUDIES:  No results found.   PATHOLOGY:     ASSESSMENT and THERAPY PLAN:   Malignant neoplasm of upper-outer quadrant of left breast in female, estrogen receptor negative ([place]) Erica [name] is a 46 year old woman with history of stage III invasive breast cancer triple negative diagnosed in 2010 status post chemotherapy, lumpectomy and axillary node dissection, and adjuvant radiation.  Erica [name] has no clinical or radiographic sign of breast cancer recurrence.  She is overdue for her mammogram and I placed orders for that today.  We also discussed other cancer screening and I sent her back over to J Pearl's  office to see if he wants to do colonoscopy on her at this time.  She continues on Zoladex for her dysfunctional uterine bleeding.  The initial plan is for her to receive the therapy until she turns 46 years old at which point she will be tested to evaluate whether she is in menopause.  Considering her malignant hypertension that has been persistent I sent Skeet Latch a question asking whether she thinks the Zoladex could be contributing to the elevated blood pressure.  For now Erica [name] will continue on the Zoladex and she will return every 4 weeks unless I receive a different recommendation from Dr. Oval [name].  We will see her back in a year.   All questions were answered. The patient knows to call the  clinic with any problems, questions or concerns. We can certainly see the patient much sooner if necessary.  Total encounter time:30 minutes*in face-to-face visit time, chart review, lab review, care coordination, order entry, and documentation of the encounter time.    Wilber Bihari, NP 12/06/21 1:21 PM Medical Oncology and Hematology [facility] [place], [place] [address] Tel. [phone]    Fax. [phone]  *Total Encounter Time as defined by the Centers for Medicare and Medicaid Services includes, in addition to the face-to-face time of a patient visit (documented in the note above) non-face-to-face time: obtaining and reviewing outside history, ordering and reviewing medications, tests or procedures, care coordination (communications with other health care professionals or caregivers) and documentation in the medical record.

## 2021-12-07 ENCOUNTER — Other Ambulatory Visit (HOSPITAL_COMMUNITY): Payer: Self-pay

## 2021-12-09 ENCOUNTER — Other Ambulatory Visit (HOSPITAL_BASED_OUTPATIENT_CLINIC_OR_DEPARTMENT_OTHER): Payer: Self-pay | Admitting: Family

## 2021-12-09 ENCOUNTER — Other Ambulatory Visit: Payer: Self-pay | Admitting: Cardiovascular Disease

## 2021-12-10 NOTE — Telephone Encounter (Signed)
Rx(s) sent to pharmacy electronically.  

## 2021-12-11 ENCOUNTER — Telehealth: Payer: Self-pay | Admitting: Adult Health

## 2021-12-11 NOTE — Telephone Encounter (Signed)
Scheduled appointment per 9/7 los. Patient is aware.

## 2021-12-29 ENCOUNTER — Other Ambulatory Visit: Payer: Self-pay | Admitting: Cardiovascular Disease

## 2021-12-29 DIAGNOSIS — Z006 Encounter for examination for normal comparison and control in clinical research program: Secondary | ICD-10-CM

## 2021-12-30 NOTE — Progress Notes (Unsigned)
Subjective:   Patient ID: Erica [name], female     DOB: May 11, 1975    MRN: [id number]    Brief patient profile:  89  yobf  never smoker with asthma all her life much worse since around 07/2010 referred by Dr [name] to pulmonary clinic in Sept 2012 Hx of Breast Cancer 05/2008    History of Present Illness  12/18/2010 Initial pulmonary office eval on acei and  advair cc dtc asthma x 4 months but baseline = excess saba use "even when better" at least twice daily despite daily advair, worse at hs in fact  used saba 3 x last night.  On chemo /RT for breast ca but last rx was Jan 2012 well before asthma worse.  To er multiple times >  no better with singular added x 3 months. rec Stop Lisinopril Dulera 200 Take 2 puffs first thing in am and then another 2 puffs about 12 hours later.  Work on inhaler technique  Nexium 40 mg   Take 30-60 min before first meal of the day and Pepcid 20 mg one bedtime until return Only use your albuterol as a rescue medication to be used if you can't catch your breath by resting or doing a relaxed purse lip breathing pattern. The less you use it, the better it will work when you need it.  Prednisone 10 mg take  4 each am x 2 days,   2 each am x 2 days,  1 each am x2days and stop    01/03/2011 f/u ov/Erica [name] cc much better off ace and advair but still needing saba twice daily on avg. No  Purulent sputum. rec no change in rx/ rule of 2's reviewed     04/18/2014 NP Follow up and Med Review / NP  Patient returns for a two-week follow-up and medication review. We reviewed all her medications organize them into a medication count with patient education It appears that she is taking her medications correctly, however, she has over 30 medications and has had several changes recently. Last visit, her [name] was decreased to the 141mg says that her breathing was better on the 2040m Patient had a chest x-ray last visit that showed no acute changes She underwent a CT sinus  due to persistent cough , which was positive for sinusitis on the right She was treated with a ten-day course of Avelox, she has one dose left Does feel some better but still has cough . She denies any hemoptysis, orthopnea, PND or leg swelling  Patient has upcoming sleep study. Next week for suspected underlying sleep apnea with snoring and daytime sleepiness rec Refer to ENT for sinusitis and chronic cough > never saw one> f/u sinus CT neg 07/26/14  Increase Dulera 200 2 puffs Twice daily   Follow med calendar closely and bring to each visit.      10/05/2014 f/u ov/Erica [name] re: asthma with component of uacs/ no med calendar  Chief Complaint  Patient presents with   Follow-up    Here to discuss meds- Dulera not on her drug formulary. Pt states breathing is doing well and denies any new co's today.  turns out using neb at least once every 24 h, esp between 2 and 4 am when wakes up with severe dry cough x months/ pred helps some but never eliminated noct cough rec symbicort 160 Take 2 puffs first thing in am and then another 2 puffs about 12 hours later.  Try taking the tramadol around 2  am to see if helps with the early am cough/ wheeze  See calendar for specific medication instructions and bring it back for each and every office visit  Separating the top medications from the bottom group is fundamental to providing you adequate care going forward.   Please schedule a follow up office visit in 2 weeks, sooner if needed with all medications in 2 separate bags       06/25/2021  f/u ov/Erica [name] re: lifelong cough /asthma  maint on symbicort 160 2bid  but not first thing in am (last filled 05/01/21 )  Chief Complaint  Patient presents with   Follow-up    Doing well  Dyspnea:  2-3 x per weeks x hour   stair/ bike stops due to heart rate  Cough: p stir bubbles x 15 min before symbicort  Sleeping: on bipap flat bed with wedge so about 45 degrees SABA use: less than prior  02: 2lpm hs with bipap,   then with ex sometimes  Covid status:   vax x 5  Rec Symbicort 160 Take 2 puffs first thing in am and then another 2 puffs about 12 hours later.  Work on inhaler technique:    12/31/2021  f/u ov/Erica [name] re: ***   maint on ***  No chief complaint on file.   Dyspnea:  *** Cough: *** Sleeping: *** SABA use: *** 02: *** Covid status:   ***   No obvious day to day or daytime variability or assoc excess/ purulent sputum or mucus plugs or hemoptysis or cp or chest tightness, subjective wheeze or overt sinus or hb symptoms.   *** without nocturnal  or early am exacerbation  of respiratory  c/o's or need for noct saba. Also denies any obvious fluctuation of symptoms with weather or environmental changes or other aggravating or alleviating factors except as outlined above   No unusual exposure hx or h/o childhood pna/ asthma or knowledge of premature birth.  Current Allergies, Complete Past Medical History, Past Surgical History, Family History, and Social History were reviewed in [employer] record.  ROS  The following are not active complaints unless bolded Hoarseness, sore throat, dysphagia, dental problems, itching, sneezing,  nasal congestion or discharge of excess mucus or purulent secretions, ear ache,   fever, chills, sweats, unintended wt loss or wt gain, classically pleuritic or exertional cp,  orthopnea pnd or arm/hand swelling  or leg swelling, presyncope, palpitations, abdominal pain, anorexia, nausea, vomiting, diarrhea  or change in bowel habits or change in bladder habits, change in stools or change in urine, dysuria, hematuria,  rash, arthralgias, visual complaints, headache, numbness, weakness or ataxia or problems with walking or coordination,  change in mood or  memory.        No outpatient medications have been marked as taking for the 12/31/21 encounter (Appointment) with Tanda Rockers, MD.   Current Facility-Administered Medications for the 12/31/21  encounter (Appointment) with Tanda Rockers, MD  Medication   sodium chloride flush (NS) 0.9 % injection 3 mL                        Objective:  Physical Exam   wts   12/31/2021    ***  06/25/2021   220 11/03/2019     204  05/19/2019   207  01/25/2019   202 Wt 213  12/18/10 > 01/03/2011  214 > 01/31/2011  206 >  09/11/2012 209 >208 6/19 > 210 09/25/2012 >  11/20/2012 211 >208 12/07/12> 01/12/2013 209 > 02/09/2013 210 >207 04/06/2013 > 07/14/2013  213>212 07/28/2013 >204 01/26/2014> 04/04/2014 206  >205 04/18/2014 >  07/18/2014 201 >  08/01/14 201 >198 08/30/2014 > 10/05/14  195 > 10/19/2014 192 > 191 11/08/2014 > 02/10/2015  198 > 03/24/2015   198 > 06/29/2015   201 > 08/31/2015  204 > 11/17/2015  197 > 12/15/2015  188 > 03/22/2016   188 > 07/07/2017  198 > 03/06/2018  199  > 06/05/2018  200    Vital signs reviewed  12/31/2021  - Note at rest 02 sats  ***% on ***   General appearance:    ***

## 2021-12-31 ENCOUNTER — Ambulatory Visit (INDEPENDENT_AMBULATORY_CARE_PROVIDER_SITE_OTHER): Payer: Medicare Other | Admitting: Internal Medicine

## 2021-12-31 ENCOUNTER — Encounter: Payer: Self-pay | Admitting: Internal Medicine

## 2021-12-31 VITALS — BP 150/86 | HR 102 | Temp 98.3°F | Ht 65.0 in | Wt 217.0 lb

## 2021-12-31 DIAGNOSIS — J9611 Chronic respiratory failure with hypoxia: Secondary | ICD-10-CM | POA: Diagnosis not present

## 2021-12-31 DIAGNOSIS — J45991 Cough variant asthma: Secondary | ICD-10-CM

## 2021-12-31 DIAGNOSIS — R058 Other specified cough: Secondary | ICD-10-CM | POA: Diagnosis not present

## 2021-12-31 DIAGNOSIS — Z23 Encounter for immunization: Secondary | ICD-10-CM | POA: Diagnosis not present

## 2021-12-31 NOTE — Telephone Encounter (Signed)
Rx(s) sent to pharmacy electronically.  

## 2021-12-31 NOTE — Assessment & Plan Note (Signed)
As of 12/31/2021 on 2lpm/bipap  and prn with ex  Advised on goals of amb 02:  Make sure you check your oxygen saturation  AT  your highest level of activity (not after you stop)   to be sure it stays over 90% and adjust  02 flow upward to maintain this level if needed but remember to turn it back to previous settings when you stop (to conserve your supply).   Continue sub max ex per Dr Oval Linsey, cardiology  With goal to get into neg cal balance to lose wt.           Each maintenance medication was reviewed in detail including emphasizing most importantly the difference between maintenance and prns and under what circumstances the prns are to be triggered using an action plan format where appropriate.  Total time for H and P, chart review, counseling, reviewing hfa/neb/02  device(s) and generating customized AVS unique to this office visit / same day charting = 30 min

## 2021-12-31 NOTE — Assessment & Plan Note (Signed)
Onset reported in childhood  - PFT's 01/31/2011 FEV1  1.76 (60% ) ratio 68 and no better p B2,  DLCO 70% -med review 09/25/2012 > Dulera drug assistance  paperwork given    - HFA 90% 11/20/2012 .  -Med calendar 12/07/12 , 04/06/2013 , 04/18/2014,, 08/30/2014 , 11/08/2014  - PFT's 01/12/2013  1.73 (65%)  Ratio 73 and no better p B2, DLCO 86% - trial off singulair 02/08/13 >>>did not stop  - 04/05/2014  trial of dulera 100 instead of 200 > worse  -04/18/2014 increase Dulera 200  - 08/01/2014 p extensive coaching HFA effectiveness =    90% - 08/01/2014  Walked RA x 2laps @ 185 ft each stopped due to cough > sob/ no desat moderate pace  - spirometry 08/01/2014  FEV1  1.87 (70%) ratio 78  fef 25-75 53%  - spirometry 02/10/2015  flattening of top portion of f/v loop only    - 02/10/2015   Walked RA  2 laps @ 185 ft each stopped due to cough > sob/ slow pace   -  NO 03/24/2015   =  16 - Methacholine challenge 04/04/2015   POS asthma = wheeze/sob but no coughing fits > added qvar 80 2bid 06/29/2015  - Allergy profile 11/17/2015 >  Eos 0.1 /  IgE  80 POS RAST  to grass, ragweed, cedar tree    - Spirometry 08/31/2015  FEV1 0.75 (28%)  Ratio 68  - FENO 11/17/2015  =  8  on symb 160 2bid and qvar 80 2bid  - FENO 03/22/2016  = 36 on symb 160/qvar 80 though not able to verify being used as directed    All goals of chronic asthma control met including optimal function and elimination of symptoms with minimal need for rescue therapy (only using saba pre ex and only as hfa/ not neb)   Contingencies discussed in full including contacting this office immediately if not controlling the symptoms using the rule of two's.     >>> no change symbicort 160 2bid and f/u q 12 m, sooner if needed

## 2021-12-31 NOTE — Patient Instructions (Signed)
Please schedule a follow up visit in 12  months but call sooner if needed

## 2021-12-31 NOTE — Assessment & Plan Note (Signed)
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 Uri/ poorly controlled GERD - flare mid Oct 2020 with uri / assoc secondary gerd  >>> 11/03/2019 trial of gabapentin 100 qid > increaed to 300 tid as of 12/31/2021 and no longer opioid dep on phenergan dm prn

## 2022-01-03 ENCOUNTER — Inpatient Hospital Stay: Payer: Medicare Other

## 2022-01-04 ENCOUNTER — Inpatient Hospital Stay: Payer: Medicare Other | Attending: Oncology

## 2022-01-04 VITALS — BP 185/95 | HR 89 | Temp 98.5°F | Resp 20

## 2022-01-04 DIAGNOSIS — C50412 Malignant neoplasm of upper-outer quadrant of left female breast: Secondary | ICD-10-CM | POA: Diagnosis present

## 2022-01-04 DIAGNOSIS — Z79818 Long term (current) use of other agents affecting estrogen receptors and estrogen levels: Secondary | ICD-10-CM | POA: Insufficient documentation

## 2022-01-04 MED ORDER — GOSERELIN ACETATE 3.6 MG ~~LOC~~ IMPL
3.6000 mg | DRUG_IMPLANT | SUBCUTANEOUS | Status: DC
  Administered 2022-01-04: 3.6 mg via SUBCUTANEOUS
  Filled 2022-01-04: qty 3.6

## 2022-01-14 ENCOUNTER — Other Ambulatory Visit: Payer: Self-pay

## 2022-01-14 DIAGNOSIS — I2510 Atherosclerotic heart disease of native coronary artery without angina pectoris: Secondary | ICD-10-CM

## 2022-01-14 DIAGNOSIS — E785 Hyperlipidemia, unspecified: Secondary | ICD-10-CM

## 2022-01-14 MED ORDER — ROSUVASTATIN CALCIUM 40 MG PO TABS: 40.0000 mg | ORAL_TABLET | Freq: Every day | ORAL | 3 refills | Status: DC

## 2022-01-14 NOTE — Telephone Encounter (Signed)
Rx request sent to pharmacy.  

## 2022-01-14 NOTE — Telephone Encounter (Signed)
This is Dr. [place]'s pt.  °

## 2022-01-24 ENCOUNTER — Other Ambulatory Visit (HOSPITAL_BASED_OUTPATIENT_CLINIC_OR_DEPARTMENT_OTHER): Payer: Self-pay | Admitting: Cardiovascular Disease

## 2022-01-24 DIAGNOSIS — R030 Elevated blood-pressure reading, without diagnosis of hypertension: Secondary | ICD-10-CM

## 2022-01-24 DIAGNOSIS — I10 Essential (primary) hypertension: Secondary | ICD-10-CM

## 2022-01-28 ENCOUNTER — Telehealth (HOSPITAL_BASED_OUTPATIENT_CLINIC_OR_DEPARTMENT_OTHER): Payer: Self-pay | Admitting: Cardiovascular Disease

## 2022-01-28 NOTE — Telephone Encounter (Signed)
Will forward to [place] to reach out to patient to get rescheduled

## 2022-01-28 NOTE — Telephone Encounter (Signed)
Patient in called in having to cancel her car BP monitor appointment for today due to her son being sick. She is requesting a callback to reschedule another time for her to come pick up the monitor. Please advise.

## 2022-01-31 ENCOUNTER — Inpatient Hospital Stay: Payer: Medicare Other | Attending: Oncology

## 2022-01-31 DIAGNOSIS — C50412 Malignant neoplasm of upper-outer quadrant of left female breast: Secondary | ICD-10-CM | POA: Insufficient documentation

## 2022-01-31 DIAGNOSIS — Z79818 Long term (current) use of other agents affecting estrogen receptors and estrogen levels: Secondary | ICD-10-CM | POA: Insufficient documentation

## 2022-02-11 ENCOUNTER — Encounter: Payer: Self-pay | Admitting: Internal Medicine

## 2022-02-17 ENCOUNTER — Other Ambulatory Visit (HOSPITAL_BASED_OUTPATIENT_CLINIC_OR_DEPARTMENT_OTHER): Payer: Self-pay | Admitting: Family

## 2022-02-18 NOTE — Telephone Encounter (Signed)
Rx(s) sent to pharmacy electronically.  

## 2022-02-28 ENCOUNTER — Inpatient Hospital Stay: Payer: Medicare Other

## 2022-02-28 VITALS — BP 196/88 | HR 69 | Temp 98.5°F | Resp 18

## 2022-02-28 DIAGNOSIS — C50412 Malignant neoplasm of upper-outer quadrant of left female breast: Secondary | ICD-10-CM | POA: Diagnosis not present

## 2022-02-28 DIAGNOSIS — Z79818 Long term (current) use of other agents affecting estrogen receptors and estrogen levels: Secondary | ICD-10-CM | POA: Diagnosis present

## 2022-02-28 MED ORDER — GOSERELIN ACETATE 3.6 MG ~~LOC~~ IMPL
3.6000 mg | DRUG_IMPLANT | SUBCUTANEOUS | Status: DC
  Administered 2022-02-28: 3.6 mg via SUBCUTANEOUS
  Filled 2022-02-28: qty 3.6

## 2022-02-28 NOTE — Patient Instructions (Signed)
Goserelin Implant What is this medication? GOSERELIN (GOE se rel in) treats prostate cancer and breast cancer. It works by decreasing levels of the hormones testosterone and estrogen in the body. This prevents prostate and breast cancer cells from spreading or growing. It may also be used to treat endometriosis. This is a condition where the tissue that lines the uterus grows outside the uterus. It works by decreasing the amount of estrogen your body makes, which reduces heavy bleeding and pain. It can also be used to help thin the lining of the uterus before a surgery used to prevent or reduce heavy periods. This medicine may be used for other purposes; ask your health care provider or pharmacist if you have questions. COMMON BRAND NAME(S): Zoladex, Zoladex 3-Month What should I tell my care team before I take this medication? They need to know if you have any of these conditions: Bone problems Diabetes Heart disease History of irregular heartbeat or rhythm An unusual or allergic reaction to goserelin, other medications, foods, dyes, or preservatives Pregnant or trying to get pregnant Breastfeeding How should I use this medication? This medication is injected under the skin. It is given by your care team in a hospital or clinic setting. Talk to your care team about the use of this medication in children. Special care may be needed. Overdosage: If you think you have taken too much of this medicine contact a poison control center or emergency room at once. NOTE: This medicine is only for you. Do not share this medicine with others. What if I miss a dose? Keep appointments for follow-up doses. It is important not to miss your dose. Call your care team if you are unable to keep an appointment. What may interact with this medication? Do not take this medication with any of the following: Cisapride Dronedarone Pimozide Thioridazine This medication may also interact with the following: Other  medications that cause heart rhythm changes This list may not describe all possible interactions. Give your health care provider a list of all the medicines, herbs, non-prescription drugs, or dietary supplements you use. Also tell them if you smoke, drink alcohol, or use illegal drugs. Some items may interact with your medicine. What should I watch for while using this medication? Visit your care team for regular checks on your progress. Your symptoms may appear to get worse during the first weeks of this therapy. Tell your care team if your symptoms do not start to get better or if they get worse after this time. Using this medication for a long time may weaken your bones. If you smoke or frequently drink alcohol you may increase your risk of bone loss. A family history of osteoporosis, chronic use of medications for seizures (convulsions), or corticosteroids can also increase your risk of bone loss. The risk of bone fractures may be increased. Talk to your care team about your bone health. This medication may increase blood sugar. The risk may be higher in patients who already have diabetes. Ask your care team what you can do to lower your risk of diabetes while taking this medication. This medication should stop regular monthly menstruation in women. Tell your care team if you continue to menstruate. Talk to your care team if you wish to become pregnant or think you might be pregnant. This medication can cause serious birth defects if taken during pregnancy or for 12 weeks after stopping treatment. Talk to your care team about reliable forms of contraception. Do not breastfeed while taking this   medication. This medication may cause infertility. Talk to your care team if you are concerned about your fertility. What side effects may I notice from receiving this medication? Side effects that you should report to your care team as soon as possible: Allergic reactions--skin rash, itching, hives, swelling  of the face, lips, tongue, or throat Change in the amount of urine Heart attack--pain or tightness in the chest, shoulders, arms, or jaw, nausea, shortness of breath, cold or clammy skin, feeling faint or lightheaded Heart rhythm changes--fast or irregular heartbeat, dizziness, feeling faint or lightheaded, chest pain, trouble breathing High blood sugar (hyperglycemia)--increased thirst or amount of urine, unusual weakness or fatigue, blurry vision High calcium level--increased thirst or amount of urine, nausea, vomiting, confusion, unusual weakness or fatigue, bone pain Pain, redness, irritation, or bruising at the injection site Severe back pain, numbness or weakness of the hands, arms, legs, or feet, loss of coordination, loss of bowel or bladder control Stroke--sudden numbness or weakness of the face, arm, or leg, trouble speaking, confusion, trouble walking, loss of balance or coordination, dizziness, severe headache, change in vision Swelling and pain of the tumor site or lymph nodes Trouble passing urine Side effects that usually do not require medical attention (report to your care team if they continue or are bothersome): Change in sex drive or performance Headache Hot flashes Rapid or extreme change in emotion or mood Sweating Swelling of the ankles, hands, or feet Unusual vaginal discharge, itching, or odor This list may not describe all possible side effects. Call your doctor for medical advice about side effects. You may report side effects to FDA at 1-800-FDA-1088. Where should I keep my medication? This medication is given in a hospital or clinic. It will not be stored at home. NOTE: This sheet is a summary. It may not cover all possible information. If you have questions about this medicine, talk to your doctor, pharmacist, or health care provider.  2023 Elsevier/Gold Standard (2007-05-09 00:00:00)  

## 2022-03-18 ENCOUNTER — Encounter: Payer: Self-pay | Admitting: Internal Medicine

## 2022-03-19 MED ORDER — AZITHROMYCIN 250 MG PO TABS: ORAL_TABLET | ORAL | 0 refills | Status: DC

## 2022-03-19 MED ORDER — PREDNISONE 10 MG PO TABS: ORAL_TABLET | ORAL | 0 refills | Status: DC

## 2022-03-19 NOTE — Telephone Encounter (Signed)
Ill send in zpack and prednisone taper.

## 2022-03-19 NOTE — Telephone Encounter (Signed)
Beth, please advise on pt's message regarding recent acute symptoms. Dr. Melvyn Novas is unavailable per Liborio Nixon. Thanks.

## 2022-03-24 ENCOUNTER — Other Ambulatory Visit: Payer: Self-pay | Admitting: Internal Medicine

## 2022-03-24 ENCOUNTER — Other Ambulatory Visit (HOSPITAL_BASED_OUTPATIENT_CLINIC_OR_DEPARTMENT_OTHER): Payer: Self-pay | Admitting: Cardiovascular Disease

## 2022-03-28 ENCOUNTER — Inpatient Hospital Stay: Payer: Medicare Other | Attending: Hematology and Oncology

## 2022-03-28 VITALS — BP 213/103 | HR 89 | Temp 99.4°F | Resp 18

## 2022-03-28 DIAGNOSIS — Z79818 Long term (current) use of other agents affecting estrogen receptors and estrogen levels: Secondary | ICD-10-CM | POA: Insufficient documentation

## 2022-03-28 DIAGNOSIS — C50412 Malignant neoplasm of upper-outer quadrant of left female breast: Secondary | ICD-10-CM | POA: Insufficient documentation

## 2022-03-28 MED ORDER — GOSERELIN ACETATE 3.6 MG ~~LOC~~ IMPL
3.6000 mg | DRUG_IMPLANT | SUBCUTANEOUS | Status: DC
  Administered 2022-03-28: 3.6 mg via SUBCUTANEOUS
  Filled 2022-03-28: qty 3.6

## 2022-04-05 ENCOUNTER — Encounter: Payer: Self-pay | Admitting: Oncology

## 2022-04-10 ENCOUNTER — Ambulatory Visit (HOSPITAL_BASED_OUTPATIENT_CLINIC_OR_DEPARTMENT_OTHER): Payer: Medicare Other | Admitting: Cardiovascular Disease

## 2022-04-10 NOTE — Progress Notes (Deleted)
Cardiology Office Note:    Date:  04/10/2022   ID:  Erica [name], DOB 01-06-1976, MRN [id number]  PCP:  Pcp, No  Cardiologist:  Skeet Latch, MD  Nephrologist:  Referring MD: No ref. provider found   CC: Hypertension  History of Present Illness:    Erica [name] is a 47 y.o. female with a hx of resistant hypertension, medically managed CAD, chronic diastolic heart failure, breast cancer s/p chemo and XRT, morbid obesity and OSA on BiPAP, here for follow-up. She initially established care in the hypertension clinic 04/10/2020. She reports that her BP has been elevated since she was treated for breast cancer in 2010.  Since then her blood pressure has never been very well have been controlled.  She has been on multiple antihypertensives and her blood pressure remains in the [id number] to [id number] systolic.  She struggles with exertional dyspnea that makes it hard for her to walk even short distances.  Echo 03/2018 revealed LVEF 45 to 50% with grade 2 diastolic dysfunction.  There was mild MR.  PASP was 33.  Erica [name] has a history of recurrent syncope.  She had an episode that occurred while wearing an ambulatory monitor and the episode was felt to be due to narcolepsy.  She has sleep apnea but is not tolerant of her CPAP machine.  She now uses her BIPAP machine but struggles with insomnia.  She wakes up hourly.  She underwent left and right heart catheterization on 05/04/2019.  She was found to have 60% distal left circumflex and 80% distal LAD disease.  Given her aspirin allergy Dr. Meda Coffee recommended that she start clopidogrel.  Right atrial pressure was 10, and PA pressure was 26/10.  PCWP 18.  LVEDP was 23 mmHg.  At her first hypertension clinic visit there were concerns about whether she was taking her medication as prescribed.  Discrepancies between what was ordered and what she was actually picking up from the pharmacy.  She continues to report taking all medications as prescribed.  Her blood  pressures continue to run quite elevated.  At her initial assessment doxazosin was added to her regimen.  This does not seem to have helped much.  She followed up with our pharmacist and was started on amlodipine.  She was unable to exercise due to poorly controlled blood pressure. Renal artery Dopplers revealed 1- 59% stenosis bilaterally in 2019.  CT-A of the abdomen showed a 1.3 cm renal cyst but no adrenal adenomas.  There was no evidence of renal artery stenosis.  Erica [name] saw our pharmacist on 12/2019.  She was in a lot of pain at the time.  Given her high number or medications and her pain, her medications were not changed.  She has struggled with LE edema.  LE Doppler was negative for DVT 10/2019.  She was given metolazone to take prior to torsemide.  For the last two months she has been more swollen but it is finally better.  Her weight has decreased from 211 lb to 187 lb.  Erica [name] continues to struggle with shortness of breath. Her edema resolved but there was no improvement in her breathing.  She uses her CPAP every other night.  She notes that there is a recall on her CPAP and she isn't supposed to be using it.  However, she can't sleep well without it. Lately her BP at home has been in the 150-200s.  She notes that when she has pain it is higher.  She  has a lot of joint pain, especially in her feel.    Erica [name] wanted to reduce her medications.  However her blood pressure was significantly elevated.  We reduced hydralazine to 50 mg 3 times a day and increase doxazosin to 8 mg twice daily in an effort to try and get her off of her 3 times daily medications and improve compliance.  Ranexa was increased due to chest pain.  This seems to have helped her chest pain.  It seems to occur randomly both at rest and with exertion.  Her clonidine was increased to 0.6 mg every 24 hours. She wore an  ambulatory monitor that showed PVCs and up to 8 beats of SVT.  The episodes seem to occur mostly in the early  a.m.  She started going to the PREP program at the [facility].  She notes that her heart rate goes up quickly.  It makes her feel exhausted.  She takes her rest when she needs to and start back exercising.  She has lower extremity edema by the end of the day but no orthopnea or PND.  She did get a new CPAP machine and struggles to use it because of claustrophobia.  However she does use it every night. [facility] to have exertional intolerance and chest pain.  She underwent left heart catheterization 09/2020 and her cath revealed 80% distal LAD and 30% LPA B with 50% PL 1 stenoses.  Overall unchanged from prior.  Her blood pressure is very poorly controlled, initially in the low 200s over 110s.  It did improve to 152/78 with hydralazine and verapamil IV.  It was noted that she was extremely anxious and that it was also contributing.  Her angina was thought to be due to microvascular disease and poorly controlled blood pressure.  Her distal LAD lesion was not thought to be favorable for PCI but if her chest pain were intractable could be considered.  Erica [name] was struggling with anxiety and referred to psychiatry. Doxazosin was increased, and plans were made to refer her to the Target BP study at [facility]. She was struggling with volume overload and torsemide was increased.  Her weight was up 10 lb.  She was also given metolazone.  BNP at that time was 41.9. She followed up with Laurann Montana, NP 05/2021 and was not taking doxazosin because she thought it was replaced by diltiazem. Imdur was stopped due to headaches. She was advised to resume doxazosin.  At the last visit her blood pressure remained poorly controlled.  HCTZ was switched to chlorthalidone.  She was referred for the renal denervation study but denied due to being on too many blood pressure medications.  While on her prescribed blood pressure medications her average daytime blood pressure was 135/68.  There was 117/60  1 at night.  On average her 24-hour blood pressure was 127/65.  During the whitecoat.  It was 182/96.  During her work-up, she followed up with Laurann Montana, NP on 08/2021  Erica [name] has not been able to exercise recently due to a fall three weeks ago, which resulted in a bruised leg that is still healing. She also experienced another fall from her bed, causing discomfort in her back. The patient reports some fluid retention, particularly in her chest, which made it difficult for her to breathe. She took metolazone to alleviate the fluid buildup, which helped. The patient's weight has been increasing over the past six months, and she  has been advised to eat more frequent, smaller meals with increased protein intake to help manage her weight.  She uses a BiPAP machine for sleep apnea but reports feeling uncomfortable while using it, likening the sensation to being in a casket. Despite this, she tries to use it every night and finds it helpful during days with difficult breathing.  Past Medical History:  Diagnosis Date   Allergy    Anxiety    Apnea, sleep 05/04/2014   AHI 31/hr now on CPAP at 12cm H2O   Arthritis    Asthma    Asthma without status asthmaticus 01/19/2016   Axillary hidradenitis suppurativa 08/01/2011   Blood transfusion without reported diagnosis    Breast cancer ([place]) 2010   a. locally advanced left breast carcinoma diagnosed in 2010 and treated with neoadjuvant chemotherapy with docetaxel and cyclophosphamide as well as paclitaxel. This was followed by radiation therapy which was completed in 2011.   Chemotherapy-induced peripheral neuropathy ([place]) 12/09/2016   CHF (congestive heart failure) (HCC)    Chronic diastolic CHF (congestive heart failure) ([place]) 06/23/2013   Chronic kidney disease    Chronic pain in left foot 06/29/2018   Chronic stable angina 08/22/2020   Cough variant asthma 09/27/2010   Followed in Pulmonary clinic/ [facility]/ [place]  Onset reported in childhood   - PFT's 01/31/2011 FEV1  1.76 (60% ) ratio 68 and no better p B2,  DLCO 70% -med review 09/25/2012 > [place] drug assistance  paperwork given    - HFA 90% 11/20/2012 .  -Med calendar 12/07/12 , 04/06/2013 , 04/18/2014,, 08/30/2014 , 11/08/2014  - PFT's 01/12/2013  1.73 (65%)  Ratio 73 and no better p B2, DLCO 86% - trial off    Depression with somatization 06/15/2015   Dyspnea on exertion    a. 01/2013 Lexi MV: EF 54%, no ischemia/infarct;  b. 05/2013 Echo: EF 60-65%, no rwma, Gr 2 DD;  b.    Essential hypertension 10/09/2014   Estimated Creatinine Clearance: 82.4 mL/min (by C-G formula based on SCr of 0.98 mg/dL).   Fatty liver    Gastroparesis    GERD (gastroesophageal reflux disease)    Hyperglycemia 04/13/2011   Hypertension    Hypertensive heart disease 09/26/2015   Hypertriglyceridemia without hypercholesterolemia 06/15/2015   Framingham risk score is 1%    Idiopathic intracranial hypertension 10/21/2014   Impaired glucose tolerance 04/13/2011   Insomnia secondary to anxiety 04/05/2015   Lymphadenitis, chronic    restricted LEFT extremity   Malignant neoplasm of upper-outer quadrant of left breast in female, estrogen receptor negative ([place]) 01/07/2013   Stage III, Invasive Ductal, s/p partial Left Mastectomy/Lumpectomy (baseball sized) with Lymph Node dissection, had Chemo and RadRx     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[place]) 09/27/2010   OSA (obstructive sleep apnea) 05/04/2014   AHI 31/hr now on CPAP at 12cm H2O   Oxygen deficiency    Uses 02 in CPAP   Persistent proteinuria 03/31/2018   Personal history of chemotherapy 2010   Personal history of radiation therapy 2010   Resistant hypertension 09/17/2011       Sleep apnea    SVT (supraventricular tachycardia) 08/22/2020   Tibialis posterior tendinitis, left 07/15/2018   Injected in March 10, 2019   Upper airway cough syndrome 10/09/2014   Onset around 2012 with assoc pseudowheeze MRI 05/18/15 > Paranasal sinuses are clear - 11/17/2015 rec increase h1 to 2 hs to  help with noct cough  - Allergy profile 11/17/2015 >  Eos 0.1 /  IgE  80 POS RAST  to grass, ragweed, cedar tree  - d/c acei 03/06/2018  - flare end of Jan 2020 in setting of ? Uri/ poorly controlled GERD - flare mid Oct 2020 with uri / assoc secondary gerd   Vitamin D deficiency disease 03/31/2018    Past Surgical History:  Procedure Laterality Date   BRAIN SURGERY     [place]   BREAST LUMPECTOMY Left 09/2008   CARPAL TUNNEL RELEASE Left 2011   LYMPH NODE DISSECTION Left 2010   breast; 2 wks after breast lumpectomy   RIGHT/LEFT HEART CATH AND CORONARY ANGIOGRAPHY N/A 05/04/2019   Procedure: RIGHT/LEFT HEART CATH AND CORONARY ANGIOGRAPHY;  Surgeon: Jolaine Artist, MD;  Location: [place] [facility];  Service: Cardiovascular;  Laterality: N/A;  Renal injection preformed    RIGHT/LEFT HEART CATH AND CORONARY ANGIOGRAPHY N/A 10/25/2020   Procedure: RIGHT/LEFT HEART CATH AND CORONARY ANGIOGRAPHY;  Surgeon: Leonie Man, MD;  Location: [place] [facility];  Service: Cardiovascular;  Laterality: N/A;   TENDON RELEASE Right 2012   "hand"   TUBAL LIGATION  05/11/2011   Procedure: POST PARTUM TUBAL LIGATION;  Surgeon: Emeterio Reeve, MD;  Location: [facility];  Service: Gynecology;  Laterality: Bilateral;  Induced for HTN    Current Medications: No outpatient medications have been marked as taking for the 04/10/22 encounter (Appointment) with Skeet Latch, MD.   Current Facility-Administered Medications for the 04/10/22 encounter (Appointment) with Skeet Latch, MD  Medication   sodium chloride flush (NS) 0.9 % injection 3 mL     Allergies:   Aspirin, Doxycycline, Kiwi extract, Metoprolol, Penicillins, Strawberry extract, and Sulfamethoxazole-trimethoprim   Social History   Socioeconomic History   Marital status: Single    Spouse name: Not on file   Number of children: 3   Years of education: Not on file   Highest education level: Not on file  Occupational History    Employer:  UNEMPLOYED  Tobacco Use   Smoking status: Never   Smokeless tobacco: Never  Vaping Use   Vaping Use: Never used  Substance and Sexual Activity   Alcohol use: Yes    Alcohol/week: 0.0 standard drinks of alcohol    Comment: occasionally/socially   Drug use: No   Sexual activity: Yes    Birth control/protection: Surgical  Other Topics Concern   Not on file  Social History Narrative   She lives with four children.   She is currently not working (disbaility pending).   Left handed   One story home   Drinks caffeine      Social Determinants of Health   Financial Resource Strain: Medium Risk (03/03/2020)   Overall Financial Resource Strain (CARDIA)    Difficulty of Paying Living Expenses: Somewhat hard  Food Insecurity: No Food Insecurity (04/11/2020)   Hunger Vital Sign    Worried About Running Out of Food in the Last Year: Never true    Ran Out of Food in the Last Year: Never true  Transportation Needs: No Transportation Needs (02/29/2020)   PRAPARE - Hydrologist (Medical): No    Lack of Transportation (Non-Medical): No  Physical Activity: Not on file  Stress: Stress Concern Present (06/16/2020)   [facility]    Feeling of Stress : Rather much  Social Connections: Not on file     Family History: The patient's family history includes Breast cancer in some other family members; CAD in her maternal  grandmother; Diabetes in her maternal grandmother; Fibroids (age of onset: 74) in her sister; Heart disease in her maternal grandmother; Hypertension in her maternal grandmother; Other (age of onset: 14) in her mother. There is no history of Cancer, Alcohol abuse, Early death, Hyperlipidemia, Kidney disease, Stroke, Colon cancer, Esophageal cancer, Stomach cancer, Rectal cancer, or Colon polyps.  ROS:   Please see the history of present illness.    (+) Right shoulder pain (+) Shortness of  breath (+) Bilateral LE edema All other systems reviewed and are negative.  EKGs/Labs/Other Studies Reviewed:    Echo 04/05/2021: Sonographer Comments: Patient is morbidly obese. Image acquisition  challenging due to patient body habitus.  IMPRESSIONS    1. Left ventricular ejection fraction, by estimation, is 55 to 60%. The  left ventricle has normal function. The left ventricle has no regional  wall motion abnormalities. There is mild left ventricular hypertrophy.  Left ventricular diastolic parameters  were normal. The average left ventricular global longitudinal strain is  -15.0 %. The global longitudinal strain is abnormal.   2. Right ventricular systolic function is normal. The right ventricular  size is mildly enlarged. There is mildly elevated pulmonary artery  systolic pressure. The estimated right ventricular systolic pressure is  [id number] mmHg.   3. Left atrial size was moderately dilated.   4. Right atrial size was moderately dilated.   5. The mitral valve is normal in structure. Mild mitral valve  regurgitation. No evidence of mitral stenosis.   6. The aortic valve is normal in structure. Aortic valve regurgitation is  not visualized. No aortic stenosis is present.   7. The inferior vena cava is normal in size with greater than 50%  respiratory variability, suggesting right atrial pressure of 3 mmHg.   Comparison(s): EF 45%, moderate LVH, GLS -13.1%.   Right/Left Heart Cath 10/25/2020:   Dist LAD lesion is 80% stenosed.   LPAV lesion is 30% stenosed with 50% stenosed side branch in 1st LPL.   There is hyperdynamic left ventricular systolic function.  The left ventricular ejection fraction is greater than 65% by visual estimate.   Hemodynamic findings consistent with mild (secondary) pulmonary hypertension - LVEDP ~25 mmHg, PCWP ~19-20 mmHg   Normal Cardiac Output / Index (7.91 / 3.94)   POST-OPERATIVE DIAGNOSIS:              Stable single-vessel CAD with apical LAD 80%  stenosis-no change from previous cath.  Otherwise very tortuous coronary arteries with no significant stenoses.            Mild (likely secondary) Pulmonary Hypertension with PA mean pressure of 28 mmHg (PAP 39/15 mmHg), and PCWP of 19 mmHg with LVEDP of 25 mmHg.            Significant systolic hypertension with initial pressures in the 200s over 110s, however after medication 152/78 mmHg with MAP of 109 mmHg; LVP/EDP 155/13 mmHg - 25 mmHg.            Ao sat 99%, PA sat 81%.  Fick Cardiac Output-Index: 7.91-3.84 (normal)            Suspect angina is due to accelerated hypertension, increased wall stress and microvascular ischemia.  Distal LAD lesion is not favorable for PCI (however if pain is intractable, could be considered)  Diagnostic Dominance: Right    Monitor 03/23/2020: 3 Day Zio Monitor   Quality: Fair.  Baseline artifact. Predominant rhythm: sinus Average heart rate: 84 bpm Max heart rate: 140 bpm Min  heart rate: 57 bpm Pauses >2.5 seconds: none Occasional PVCs and ventricular bigeminy Two runs of SVT up to 8 beats.  Rate 224 bpm.    LHC/RHC 05/04/19: Dist LAD-1 lesion is 30% stenosed. Dist LAD-2 lesion is 80% stenosed. Dist Cx lesion is 60% stenosed.   Findings:   Ao = 173/104 (134) LV =  181/25 RA =  10 RV =  41/13 PA =  42/10 (26) PCW = 18 Fick cardiac output/index = 5.8/2.9 PVR = 1.4 wu Ao sat = 98% PA sat = 73%, 73%   Assessment: 1. 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 LVEF 55-60% 3. Mildly elevated filling pressures with mild pulmonary venous HTN m4. Widely patent bilateral renal arteries. No evidence of AAA 5. Severe systemic HTN   Renal artery Doppler 10/29/18: Right: 1-59% stenosis of the right renal artery. Cyst(s) noted.  Left:  1-59% stenosis of the left renal artery.    Lexiscan Myoview 04/20/18: The left ventricular ejection fraction is moderately decreased (30-44%). Nuclear stress EF: 41%. There was no ST segment deviation noted during  stress. This is an intermediate risk study.   Abnormal, intermediate risk stress nuclear study with no ischemia or infarction; EF 41 with global hypokinesis and mild LVE; study intermediate risk due to reduced LV function.  Echo 03/12/18: Study Conclusions  - Left ventricle: The cavity size was normal. Wall thickness was    increased in a pattern of moderate LVH. Systolic function was    mildly reduced. The estimated ejection fraction was in the range    of 45% to 50%. Wall motion was normal; there were no regional    wall motion abnormalities. Features are consistent with a    pseudonormal left ventricular filling pattern, with concomitant    abnormal relaxation and increased filling pressure (grade 2    diastolic dysfunction).  - Mitral valve: There was mild regurgitation.  - Pulmonary arteries: Systolic pressure was mildly increased. PA    peak pressure: 33 mm Hg (S).   EKG:   07/16/2021: EKG was not ordered. 03/01/2021: EKG was not ordered. 11/27/2020: EKG was not ordered. 08/22/2020: EKG was not ordered. 06/16/2020: EKG was not ordered.   04/23/19: sinus tachycardia.  Rate 124 bpm.  LVH  Recent Labs: 05/17/2021: BNP 67.4 06/25/2021: TSH 1.63 09/17/2021: ALT 58; BUN 7; Creatinine, Ser 0.86; Hemoglobin 14.3; Magnesium 2.2; Platelets 222; Potassium 3.3; Sodium 145   Recent Lipid Panel    Component Value Date/Time   CHOL 184 09/17/2021 1001   TRIG 177 (H) 09/17/2021 1001   HDL 39 (L) 09/17/2021 1001   CHOLHDL 4.7 (H) 09/17/2021 1001   CHOLHDL 4 10/01/2018 1115   VLDL 22.6 10/01/2018 1115   LDLCALC 114 (H) 09/17/2021 1001   LDLDIRECT 115 (H) 09/17/2021 1001   LDLDIRECT 152.0 03/30/2018 1604    Physical Exam:    Wt Readings from Last 3 Encounters:  12/31/21 217 lb (98.4 kg)  12/06/21 218 lb 6.4 oz (99.1 kg)  12/04/21 220 lb (99.8 kg)    VS:  LMP  (LMP Unknown)  , BMI There is no height or weight on file to calculate BMI. GENERAL:  Well appearing HEENT: Pupils equal round  and reactive, fundi not visualized, oral mucosa unremarkable NECK:  No jugular venous distention, waveform within normal limits, carotid upstroke brisk and symmetric, no bruits LUNGS:  Clear to auscultation bilaterally HEART:  RRR.  PMI not displaced or sustained,S1 and S2 within normal limits, no S3, no S4, no clicks, no  rubs, no murmurs ABD:  Non-tender.  positive bowel sounds normal in frequency in pitch, no bruits, no rebound, no guarding, no midline pulsatile mass, no hepatomegaly, no splenomegaly EXT:  2 plus pulses throughout, no edema , no cyanosis no clubbing SKIN:  No rashes no nodules; Blistering lesion on left thigh. NEURO:  Cranial nerves II through XII grossly intact, motor grossly intact throughout PSYCH:  Cognitively intact, oriented to person place and time   ASSESSMENT:    No diagnosis found.   PLAN:    No problem-specific Assessment & Plan notes found for this encounter.  #Resistant Hypertension - Patient is currently on multiple antihypertensive medications, but blood pressure remains elevated.   Her BP was surprisingly controlled on the 24 hour BP monitor she wore for the trial.  It was different from what she usually gets at home, calling into question the accuracy of the data.  There is a possibility of white coat hypertension superimposed on resistant hypertension. Plan to redo 24-hour blood pressure monitor with a different machine to confirm the findings. If the results are consistent, consider the possibility of white coat syndrome and discuss further management options.  Continue amlodipine, chlorthalidone, clonidine, diltiazem, doxazosin, hydralazine, minoxidil, nebivolol, Entresto, and spironolactone. There is nothing else to add at this time.  We will refer her for renal denervation once FDA approved.   No BP recorded.  {Refresh Note OR Click here to enter BP  :1}***   # Chronic diastolic HF:  Euvolemic on exam.  BP management as above with torsemide as well.   She does well with prn metolazone when volume overloaded.   # Hyperlipidemia: Continue Repatha, Zetia, and rosuvastatin.  She will come back for fasting lipids and a CMP.  LDL goal is less than 70.  # BiPAP compliance - Patient is using BiPAP but reports feeling uncomfortable and sometimes removing the mask during the night. Encourage the patient to continue using BiPAP as prescribed and discuss any concerns with their sleep specialist.    Disposition: FU with APP in 4 months.  Medication Adjustments/Labs and Tests Ordered: Current medicines are reviewed at length with the patient today.  Concerns regarding medicines are outlined above.   No orders of the defined types were placed in this encounter.  No orders of the defined types were placed in this encounter.   Signed, Skeet Latch, MD  04/10/2022 8:17 AM    [place]

## 2022-04-20 ENCOUNTER — Encounter: Payer: Self-pay | Admitting: Oncology

## 2022-04-25 ENCOUNTER — Inpatient Hospital Stay: Payer: 59 | Attending: Adult Health

## 2022-04-25 VITALS — BP 135/100 | HR 84 | Temp 98.6°F | Resp 18

## 2022-04-25 DIAGNOSIS — Z79818 Long term (current) use of other agents affecting estrogen receptors and estrogen levels: Secondary | ICD-10-CM | POA: Diagnosis present

## 2022-04-25 DIAGNOSIS — C50412 Malignant neoplasm of upper-outer quadrant of left female breast: Secondary | ICD-10-CM | POA: Diagnosis present

## 2022-04-25 MED ORDER — GOSERELIN ACETATE 3.6 MG ~~LOC~~ IMPL
3.6000 mg | DRUG_IMPLANT | Freq: Once | SUBCUTANEOUS | Status: AC
  Administered 2022-04-25: 3.6 mg via SUBCUTANEOUS
  Filled 2022-04-25: qty 3.6

## 2022-04-26 ENCOUNTER — Other Ambulatory Visit (HOSPITAL_COMMUNITY): Payer: Self-pay

## 2022-04-26 ENCOUNTER — Encounter: Payer: Self-pay | Admitting: Oncology

## 2022-05-01 ENCOUNTER — Other Ambulatory Visit: Payer: Self-pay | Admitting: Nurse Practitioner

## 2022-05-06 ENCOUNTER — Other Ambulatory Visit (HOSPITAL_BASED_OUTPATIENT_CLINIC_OR_DEPARTMENT_OTHER): Payer: Self-pay | Admitting: Family

## 2022-05-06 ENCOUNTER — Other Ambulatory Visit: Payer: Self-pay | Admitting: Nurse Practitioner

## 2022-05-06 ENCOUNTER — Other Ambulatory Visit: Payer: Self-pay | Admitting: Internal Medicine

## 2022-05-06 ENCOUNTER — Other Ambulatory Visit: Payer: Self-pay | Admitting: Cardiovascular Disease

## 2022-05-06 DIAGNOSIS — J4540 Moderate persistent asthma, uncomplicated: Secondary | ICD-10-CM

## 2022-05-06 DIAGNOSIS — R072 Precordial pain: Secondary | ICD-10-CM

## 2022-05-06 DIAGNOSIS — I2510 Atherosclerotic heart disease of native coronary artery without angina pectoris: Secondary | ICD-10-CM

## 2022-05-06 DIAGNOSIS — G8929 Other chronic pain: Secondary | ICD-10-CM

## 2022-05-06 DIAGNOSIS — I5032 Chronic diastolic (congestive) heart failure: Secondary | ICD-10-CM

## 2022-05-06 DIAGNOSIS — I10 Essential (primary) hypertension: Secondary | ICD-10-CM

## 2022-05-06 DIAGNOSIS — R002 Palpitations: Secondary | ICD-10-CM

## 2022-05-06 DIAGNOSIS — I119 Hypertensive heart disease without heart failure: Secondary | ICD-10-CM

## 2022-05-06 DIAGNOSIS — E785 Hyperlipidemia, unspecified: Secondary | ICD-10-CM

## 2022-05-06 DIAGNOSIS — R0600 Dyspnea, unspecified: Secondary | ICD-10-CM

## 2022-05-23 ENCOUNTER — Inpatient Hospital Stay: Payer: 59 | Attending: Oncology

## 2022-05-23 VITALS — BP 188/98 | HR 93 | Temp 99.6°F | Resp 18

## 2022-05-23 DIAGNOSIS — Z79818 Long term (current) use of other agents affecting estrogen receptors and estrogen levels: Secondary | ICD-10-CM | POA: Insufficient documentation

## 2022-05-23 DIAGNOSIS — C50412 Malignant neoplasm of upper-outer quadrant of left female breast: Secondary | ICD-10-CM | POA: Diagnosis present

## 2022-05-23 MED ORDER — GOSERELIN ACETATE 3.6 MG ~~LOC~~ IMPL
3.6000 mg | DRUG_IMPLANT | SUBCUTANEOUS | Status: DC
  Administered 2022-05-23: 3.6 mg via SUBCUTANEOUS
  Filled 2022-05-23: qty 3.6

## 2022-05-23 NOTE — Patient Instructions (Signed)
Goserelin Implant What is this medication? GOSERELIN (GOE se rel in) treats prostate cancer and breast cancer. It works by decreasing levels of the hormones testosterone and estrogen in the body. This prevents prostate and breast cancer cells from spreading or growing. It may also be used to treat endometriosis. This is a condition where the tissue that lines the uterus grows outside the uterus. It works by decreasing the amount of estrogen your body makes, which reduces heavy bleeding and pain. It can also be used to help thin the lining of the uterus before a surgery used to prevent or reduce heavy periods. This medicine may be used for other purposes; ask your health care provider or pharmacist if you have questions. COMMON BRAND NAME(S): Zoladex, Zoladex 3-Month What should I tell my care team before I take this medication? They need to know if you have any of these conditions: Bone problems Diabetes Heart disease History of irregular heartbeat or rhythm An unusual or allergic reaction to goserelin, other medications, foods, dyes, or preservatives Pregnant or trying to get pregnant Breastfeeding How should I use this medication? This medication is injected under the skin. It is given by your care team in a hospital or clinic setting. Talk to your care team about the use of this medication in children. Special care may be needed. Overdosage: If you think you have taken too much of this medicine contact a poison control center or emergency room at once. NOTE: This medicine is only for you. Do not share this medicine with others. What if I miss a dose? Keep appointments for follow-up doses. It is important not to miss your dose. Call your care team if you are unable to keep an appointment. What may interact with this medication? Do not take this medication with any of the following: Cisapride Dronedarone Pimozide Thioridazine This medication may also interact with the following: Other  medications that cause heart rhythm changes This list may not describe all possible interactions. Give your health care provider a list of all the medicines, herbs, non-prescription drugs, or dietary supplements you use. Also tell them if you smoke, drink alcohol, or use illegal drugs. Some items may interact with your medicine. What should I watch for while using this medication? Visit your care team for regular checks on your progress. Your symptoms may appear to get worse during the first weeks of this therapy. Tell your care team if your symptoms do not start to get better or if they get worse after this time. Using this medication for a long time may weaken your bones. If you smoke or frequently drink alcohol you may increase your risk of bone loss. A family history of osteoporosis, chronic use of medications for seizures (convulsions), or corticosteroids can also increase your risk of bone loss. The risk of bone fractures may be increased. Talk to your care team about your bone health. This medication may increase blood sugar. The risk may be higher in patients who already have diabetes. Ask your care team what you can do to lower your risk of diabetes while taking this medication. This medication should stop regular monthly menstruation in women. Tell your care team if you continue to menstruate. Talk to your care team if you wish to become pregnant or think you might be pregnant. This medication can cause serious birth defects if taken during pregnancy or for 12 weeks after stopping treatment. Talk to your care team about reliable forms of contraception. Do not breastfeed while taking this   medication. This medication may cause infertility. Talk to your care team if you are concerned about your fertility. What side effects may I notice from receiving this medication? Side effects that you should report to your care team as soon as possible: Allergic reactions--skin rash, itching, hives, swelling  of the face, lips, tongue, or throat Change in the amount of urine Heart attack--pain or tightness in the chest, shoulders, arms, or jaw, nausea, shortness of breath, cold or clammy skin, feeling faint or lightheaded Heart rhythm changes--fast or irregular heartbeat, dizziness, feeling faint or lightheaded, chest pain, trouble breathing High blood sugar (hyperglycemia)--increased thirst or amount of urine, unusual weakness or fatigue, blurry vision High calcium level--increased thirst or amount of urine, nausea, vomiting, confusion, unusual weakness or fatigue, bone pain Pain, redness, irritation, or bruising at the injection site Severe back pain, numbness or weakness of the hands, arms, legs, or feet, loss of coordination, loss of bowel or bladder control Stroke--sudden numbness or weakness of the face, arm, or leg, trouble speaking, confusion, trouble walking, loss of balance or coordination, dizziness, severe headache, change in vision Swelling and pain of the tumor site or lymph nodes Trouble passing urine Side effects that usually do not require medical attention (report to your care team if they continue or are bothersome): Change in sex drive or performance Headache Hot flashes Rapid or extreme change in emotion or mood Sweating Swelling of the ankles, hands, or feet Unusual vaginal discharge, itching, or odor This list may not describe all possible side effects. Call your doctor for medical advice about side effects. You may report side effects to FDA at 1-800-FDA-1088. Where should I keep my medication? This medication is given in a hospital or clinic. It will not be stored at home. NOTE: This sheet is a summary. It may not cover all possible information. If you have questions about this medicine, talk to your doctor, pharmacist, or health care provider.  2023 Elsevier/Gold Standard (2007-05-09 00:00:00)  

## 2022-06-09 ENCOUNTER — Other Ambulatory Visit: Payer: Self-pay | Admitting: Cardiovascular Disease

## 2022-06-09 DIAGNOSIS — I10 Essential (primary) hypertension: Secondary | ICD-10-CM

## 2022-06-09 DIAGNOSIS — G932 Benign intracranial hypertension: Secondary | ICD-10-CM

## 2022-06-10 ENCOUNTER — Other Ambulatory Visit: Payer: Self-pay | Admitting: Cardiovascular Disease

## 2022-06-10 DIAGNOSIS — Z006 Encounter for examination for normal comparison and control in clinical research program: Secondary | ICD-10-CM

## 2022-06-10 NOTE — Telephone Encounter (Signed)
Rx request sent to pharmacy.  

## 2022-06-20 ENCOUNTER — Inpatient Hospital Stay: Payer: 59

## 2022-06-20 ENCOUNTER — Telehealth: Payer: Self-pay | Admitting: Adult Health

## 2022-06-20 NOTE — Telephone Encounter (Signed)
Called patient per 3/21 staff message to reschedule 3/21 missed appointment. Patient rescheduled and notified.

## 2022-06-24 ENCOUNTER — Encounter: Payer: Self-pay | Admitting: Adult Health

## 2022-06-24 ENCOUNTER — Telehealth: Payer: Self-pay

## 2022-06-24 ENCOUNTER — Inpatient Hospital Stay: Payer: 59 | Attending: Adult Health

## 2022-06-24 DIAGNOSIS — Z9012 Acquired absence of left breast and nipple: Secondary | ICD-10-CM | POA: Insufficient documentation

## 2022-06-24 DIAGNOSIS — C50412 Malignant neoplasm of upper-outer quadrant of left female breast: Secondary | ICD-10-CM | POA: Insufficient documentation

## 2022-06-24 DIAGNOSIS — Z79818 Long term (current) use of other agents affecting estrogen receptors and estrogen levels: Secondary | ICD-10-CM | POA: Insufficient documentation

## 2022-06-24 NOTE — Telephone Encounter (Signed)
Called pt per [employer]. She states the "bump" is directly beneath her breast in the middle. States it is sore to touch but denies erythema/heat to area. She was offered appt for 06/25/22 at 315. She states she will have to call us back because she needs to secure transportation first.

## 2022-06-25 ENCOUNTER — Inpatient Hospital Stay: Payer: 59

## 2022-06-25 ENCOUNTER — Inpatient Hospital Stay (HOSPITAL_BASED_OUTPATIENT_CLINIC_OR_DEPARTMENT_OTHER): Payer: 59 | Admitting: Adult Health

## 2022-06-25 ENCOUNTER — Encounter: Payer: Self-pay | Admitting: Adult Health

## 2022-06-25 VITALS — BP 202/89 | HR 92 | Temp 98.1°F | Resp 18 | Ht 65.0 in | Wt 219.0 lb

## 2022-06-25 DIAGNOSIS — C50412 Malignant neoplasm of upper-outer quadrant of left female breast: Secondary | ICD-10-CM

## 2022-06-25 DIAGNOSIS — Z9012 Acquired absence of left breast and nipple: Secondary | ICD-10-CM | POA: Diagnosis not present

## 2022-06-25 DIAGNOSIS — Z171 Estrogen receptor negative status [ER-]: Secondary | ICD-10-CM | POA: Diagnosis not present

## 2022-06-25 DIAGNOSIS — Z79818 Long term (current) use of other agents affecting estrogen receptors and estrogen levels: Secondary | ICD-10-CM | POA: Diagnosis present

## 2022-06-25 MED ORDER — GOSERELIN ACETATE 3.6 MG ~~LOC~~ IMPL
3.6000 mg | DRUG_IMPLANT | SUBCUTANEOUS | Status: DC
  Administered 2022-06-25: 3.6 mg via SUBCUTANEOUS

## 2022-06-25 NOTE — Assessment & Plan Note (Signed)
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  Breast with small nodule.  I let her know this is likely a sebaceous cyst.  I did place orders for diagnostic bilateral mammogram since she is overdue for her bilateral breast mammogram and to evaluate this breast nodule.  I also ordered an ultrasound of the right breast in case they need it for further evaluation.  She will continue with Zoladex and follow-up with Dr. Oval Linsey for her high blood pressure.

## 2022-06-25 NOTE — Progress Notes (Signed)
[facility] Follow up:    Pcp, No No address on file   DIAGNOSIS:  Cancer Staging  Malignant neoplasm of upper-outer quadrant of left breast in female, estrogen receptor negative ([place]) Staging form: Breast, AJCC 7th Edition - Clinical: Stage IIB (T2, N1, M0) - Signed by Chauncey Cruel, MD on 10/20/2014   SUMMARY OF ONCOLOGIC HISTORY: 47 y.o. [name] with history of locally advanced left breast carcinoma initially presenting in March 2010    (1) treated neoadjuvantly with 4 cycles of docetaxel/ cyclophosphamide.     (2) status post left lumpectomy and sentinel lymph node dissection in July 2010 for a ypT2 ypN1, stage IIB invasive ductal carcinoma, grade 3, triple negative, with MIB-1 of 88%.    (3) status post full axillary dissection and margin clearance in August 2010.    (4) status post 11 doses of weekly paclitaxel completed in December 2010    (5) radiation therapy completed in March 2011; now followed with observation alone.    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 female returns for follow-up and evaluation of new right breast nodule.  She noticed this over the weekend.  She has been applying warm compresses it is slowly improving.  She is overdue for her mammogram.  It was due in June 2023.  She continues to have elevated blood pressure.  She has been seen by Dr. Skeet Latch and has been on several medications for her blood pressure.  When she checks her blood pressure at home it is 1 [id number] 50 systolic.  She notes that when she is here it is typically  higher.   Patient Active Problem List   Diagnosis Date Noted   Chronic respiratory failure with hypoxia ([place]) 12/31/2021   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   Coronary artery disease    Chronic stable angina 08/22/2020   SVT (supraventricular tachycardia) 08/22/2020   Palpitations 02/28/2020   Right ankle pain 04/14/2019   OSA (obstructive sleep apnea) 10/01/2018   Tibialis posterior tendinitis, left 07/15/2018   Chronic pain in left foot 06/29/2018   Vitamin D deficiency disease 03/31/2018   Persistent proteinuria 03/31/2018   Hyperlipidemia LDL goal <100 03/30/2018   Chemotherapy-induced peripheral neuropathy ([place]) 12/09/2016   Asthma in adult, mild persistent, uncomplicated [id number]   Hypertensive heart disease 09/26/2015   Depression with somatization 06/15/2015   Hypertriglyceridemia without hypercholesterolemia 06/15/2015   Hypokalemia 06/15/2015   Insomnia secondary to anxiety 04/05/2015   Idiopathic intracranial hypertension 10/21/2014   Upper airway cough syndrome 10/09/2014   Malignant hypertension 10/09/2014   Morbid obesity due to excess calories ([place]) 10/09/2014   Apnea, sleep 05/04/2014   Acute on chronic diastolic CHF (congestive heart failure), NYHA class 2 ([place]) 05/03/2014   DOE (dyspnea on exertion) 07/14/2013   Acute on chronic diastolic heart failure ([place]) 06/23/2013   Malignant neoplasm of upper-outer quadrant of left breast in female, estrogen receptor negative ([place]) 01/07/2013   Gastroparesis 01/04/2013   Resistant hypertension 09/17/2011   Axillary hidradenitis suppurativa 08/01/2011   Preventative health care 04/13/2011   Hyperglycemia 04/13/2011   GERD (gastroesophageal reflux disease) 03/18/2011   Cough variant asthma 09/27/2010  is allergic to aspirin, doxycycline, kiwi extract, metoprolol, penicillins, strawberry extract, and sulfamethoxazole-trimethoprim.  MEDICAL HISTORY: Past Medical History:  Diagnosis Date   Allergy    Anxiety    Apnea,  sleep 05/04/2014   AHI 31/hr now on CPAP at 12cm H2O   Arthritis    Asthma    Asthma without status asthmaticus 01/19/2016   Axillary hidradenitis suppurativa 08/01/2011   Blood transfusion without reported diagnosis    Breast cancer ([place]) 2010   a. locally advanced left breast carcinoma diagnosed in 2010 and treated with neoadjuvant chemotherapy with docetaxel and cyclophosphamide as well as paclitaxel. This was followed by radiation therapy which was completed in 2011.   Chemotherapy-induced peripheral neuropathy ([place]) 12/09/2016   CHF (congestive heart failure) (HCC)    Chronic diastolic CHF (congestive heart failure) ([place]) 06/23/2013   Chronic kidney disease    Chronic pain in left foot 06/29/2018   Chronic stable angina 08/22/2020   Cough variant asthma 09/27/2010   Followed in Pulmonary clinic/ [facility]/ [place]  Onset reported in childhood  - PFT's 01/31/2011 FEV1  1.76 (60% ) ratio 68 and no better p B2,  DLCO 70% -med review 09/25/2012 > [place] drug assistance  paperwork given    - HFA 90% 11/20/2012 .  -Med calendar 12/07/12 , 04/06/2013 , 04/18/2014,, 08/30/2014 , 11/08/2014  - PFT's 01/12/2013  1.73 (65%)  Ratio 73 and no better p B2, DLCO 86% - trial off    Depression with somatization 06/15/2015   Dyspnea on exertion    a. 01/2013 Lexi MV: EF 54%, no ischemia/infarct;  b. 05/2013 Echo: EF 60-65%, no rwma, Gr 2 DD;  b.    Essential hypertension 10/09/2014   Estimated Creatinine Clearance: 82.4 mL/min (by C-G formula based on SCr of 0.98 mg/dL).   Fatty liver    Gastroparesis    GERD (gastroesophageal reflux disease)    Hyperglycemia 04/13/2011   Hypertension    Hypertensive heart disease 09/26/2015   Hypertriglyceridemia without hypercholesterolemia 06/15/2015   F[place]ingham risk score is 1%    Idiopathic intracranial hypertension 10/21/2014   Impaired glucose tolerance 04/13/2011   Insomnia secondary to anxiety 04/05/2015   Lymphadenitis, chronic    restricted LEFT extremity   Malignant  neoplasm of upper-outer quadrant of left breast in female, estrogen receptor negative ([place]) 01/07/2013   Stage III, Invasive Ductal, s/p partial Left Mastectomy/Lumpectomy (baseball sized) with Lymph Node dissection, had Chemo and RadRx     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[place]) 09/27/2010   OSA (obstructive sleep apnea) 05/04/2014   AHI 31/hr now on CPAP at 12cm H2O   Oxygen deficiency    Uses 02 in CPAP   Persistent proteinuria 03/31/2018   Personal history of chemotherapy 2010   Personal history of radiation therapy 2010   Resistant hypertension 09/17/2011       Sleep apnea    SVT (supraventricular tachycardia) 08/22/2020   Tibialis posterior tendinitis, left 07/15/2018   Injected in March 10, 2019   Upper airway cough syndrome 10/09/2014   Onset around 2012 with assoc pseudowheeze MRI 05/18/15 > Paranasal sinuses are clear - 11/17/2015 rec increase h1 to 2 hs to help with noct cough  - Allergy profile 11/17/2015 >  Eos 0.1 /  IgE  80 POS RAST  to grass, ragweed, cedar tree  - d/c acei 03/06/2018  - flare end of Jan 2020 in setting of ? Uri/ poorly controlled GERD - flare mid Oct 2020 with uri / assoc secondary gerd  Vitamin D deficiency disease 03/31/2018    SURGICAL HISTORY: Past Surgical History:  Procedure Laterality Date   BRAIN SURGERY     Shunt   BREAST LUMPECTOMY Left 09/2008   CARPAL TUNNEL RELEASE Left 2011   LYMPH NODE DISSECTION Left 2010   breast; 2 wks after breast lumpectomy   RIGHT/LEFT HEART CATH AND CORONARY ANGIOGRAPHY N/A 05/04/2019   Procedure: RIGHT/LEFT HEART CATH AND CORONARY ANGIOGRAPHY;  Surgeon: Jolaine Artist, MD;  Location: [facility];  Service: Cardiovascular;  Laterality: N/A;  Renal injection preformed    RIGHT/LEFT HEART CATH AND CORONARY ANGIOGRAPHY N/A 10/25/2020   Procedure: RIGHT/LEFT HEART CATH AND CORONARY ANGIOGRAPHY;  Surgeon: Leonie Man, MD;  Location: [facility];  Service: Cardiovascular;  Laterality: N/A;   TENDON RELEASE Right 2012    "hand"   TUBAL LIGATION  05/11/2011   Procedure: POST PARTUM TUBAL LIGATION;  Surgeon: Emeterio Reeve, MD;  Location: [facility];  Service: Gynecology;  Laterality: Bilateral;  Induced for HTN    SOCIAL HISTORY: Social History   Socioeconomic History   Marital status: Single    Spouse name: Not on file   Number of children: 3   Years of education: Not on file   Highest education level: Not on file  Occupational History    Employer: UNEMPLOYED  Tobacco Use   Smoking status: Never   Smokeless tobacco: Never  Vaping Use   Vaping Use: Never used  Substance and Sexual Activity   Alcohol use: Yes    Alcohol/week: 0.0 standard drinks of alcohol    Comment: occasionally/socially   Drug use: No   Sexual activity: Yes    Birth control/protection: Surgical  Other Topics Concern   Not on file  Social History Narrative   She lives with four children.   She is currently not working (disbaility pending).   Left handed   One story home   Drinks caffeine      Social Determinants of Health   Financial Resource Strain: Medium Risk (03/03/2020)   Overall Financial Resource Strain (CARDIA)    Difficulty of Paying Living Expenses: Somewhat hard  Food Insecurity: No Food Insecurity (04/11/2020)   Hunger Vital Sign    Worried About Running Out of Food in the Last Year: Never true    Ran Out of Food in the Last Year: Never true  Transportation Needs: No Transportation Needs (02/29/2020)   PRAPARE - Hydrologist (Medical): No    Lack of Transportation (Non-Medical): No  Physical Activity: Not on file  Stress: Stress Concern Present (06/16/2020)   [place]    Feeling of Stress : Rather much  Social Connections: Not on file  Intimate Partner Violence: Not on file    FAMILY HISTORY: Family History  Problem Relation Age of Onset   Other Mother 47       breast calcifications treated with surgery,  breast cancer pill, and radiation   Fibroids Sister 44       s/p TAH-BSO   Diabetes Maternal Grandmother    Heart disease Maternal Grandmother    Hypertension Maternal Grandmother    CAD Maternal Grandmother    Breast cancer Other        maternal great aunt (MGM's sister)   Breast cancer Other        paternal great aunt dx middle ages; s/p mastectomy   Cancer Neg Hx    Alcohol abuse  Neg Hx    Early death Neg Hx    Hyperlipidemia Neg Hx    Kidney disease Neg Hx    Stroke Neg Hx    Colon cancer Neg Hx    Esophageal cancer Neg Hx    Stomach cancer Neg Hx    Rectal cancer Neg Hx    Colon polyps Neg Hx     Review of Systems  Constitutional:  Negative for appetite change, chills, fatigue, fever and unexpected weight change.  HENT:   Negative for hearing loss, lump/mass and trouble swallowing.   Eyes:  Negative for eye problems and icterus.  Respiratory:  Negative for chest tightness, cough and shortness of breath.   Cardiovascular:  Negative for chest pain, leg swelling and palpitations.  Gastrointestinal:  Negative for abdominal distention, abdominal pain, constipation, diarrhea, nausea and vomiting.  Endocrine: Negative for hot flashes.  Genitourinary:  Negative for difficulty urinating.   Musculoskeletal:  Negative for arthralgias.  Skin:  Negative for itching and rash.  Neurological:  Negative for dizziness, extremity weakness, headaches and numbness.  Hematological:  Negative for adenopathy. Does not bruise/bleed easily.  Psychiatric/Behavioral:  Negative for depression. The patient is not nervous/anxious.       PHYSICAL EXAMINATION  ECOG PERFORMANCE STATUS: 0 - Asymptomatic  Vitals:   06/25/22 1516  BP: (!) 202/89  Pulse: 92  Resp: 18  Temp: 98.1 F (36.7 C)  SpO2: 100%    Physical Exam Constitutional:      General: She is not in acute distress.    Appearance: Normal appearance. She is not toxic-appearing.  HENT:     Head: Normocephalic and atraumatic.   Eyes:     General: No scleral icterus. Cardiovascular:     Rate and Rhythm: Normal rate and regular rhythm.     Pulses: Normal pulses.     Heart sounds: Normal heart sounds.  Pulmonary:     Effort: Pulmonary effort is normal.     Breath sounds: Normal breath sounds.  Abdominal:     General: Abdomen is flat. Bowel sounds are normal. There is no distension.     Palpations: Abdomen is soft.     Tenderness: There is no abdominal tenderness.  Musculoskeletal:        General: No swelling.     Cervical back: Neck supple.  Lymphadenopathy:     Cervical: No cervical adenopathy.  Skin:    General: Skin is warm and dry.     Findings: No rash.  Neurological:     General: No focal deficit present.     Mental Status: She is alert.  Psychiatric:        Mood and Affect: Mood normal.        Behavior: Behavior normal.     LABORATORY DATA:  None for this visit ASSESSMENT and THERAPY PLAN:   Malignant neoplasm of upper-outer quadrant of left breast in female, estrogen receptor negative ([place]) Erica [name] is a 47 year old woman with history of left-sided stage IIb triple negative breast cancer diagnosed in 2010 status post neoadjuvant chemotherapy, adjuvant chemotherapy, and adjuvant radiation to the left breast.  Breast with small nodule.  I let her know this is likely a sebaceous cyst.  I did place orders for diagnostic bilateral mammogram since she is overdue for her bilateral breast mammogram and to evaluate this breast nodule.  I also ordered an ultrasound of the right breast in case they need it for further evaluation.  She will continue with Zoladex and follow-up with Dr.  [place] for her high blood pressure.   All questions were answered. The patient knows to call the clinic with any problems, questions or concerns. We can certainly see the patient much sooner if necessary.  Total encounter time:20 minutes*in face-to-face visit time, chart review, lab review, care coordination, order  entry, and documentation of the encounter time.    Wilber Bihari, NP 06/25/22 4:13 PM Medical Oncology and Hematology [facility] [place], [place] [address] Tel. [phone]    Fax. [phone]  *Total Encounter Time as defined by the Centers for Medicare and Medicaid Services includes, in addition to the face-to-face time of a patient visit (documented in the note above) non-face-to-face time: obtaining and reviewing outside history, ordering and reviewing medications, tests or procedures, care coordination (communications with other health care professionals or caregivers) and documentation in the medical record.

## 2022-06-26 ENCOUNTER — Telehealth: Payer: Self-pay | Admitting: Adult Health

## 2022-06-26 NOTE — Telephone Encounter (Signed)
Rescheduled appointments per secure chat (per [facility]). Patient is aware of the changes made to her upcoming appointments.

## 2022-06-27 ENCOUNTER — Other Ambulatory Visit (HOSPITAL_BASED_OUTPATIENT_CLINIC_OR_DEPARTMENT_OTHER): Payer: Self-pay | Admitting: Cardiovascular Disease

## 2022-06-27 NOTE — Telephone Encounter (Signed)
Rx request sent to pharmacy.  

## 2022-06-27 NOTE — Telephone Encounter (Signed)
Rx(s) sent to pharmacy electronically.  

## 2022-07-15 ENCOUNTER — Encounter: Payer: Self-pay | Admitting: *Deleted

## 2022-07-18 ENCOUNTER — Inpatient Hospital Stay: Payer: 59

## 2022-07-23 ENCOUNTER — Inpatient Hospital Stay: Payer: 59 | Attending: Oncology

## 2022-07-23 VITALS — BP 188/94 | HR 92 | Temp 98.6°F | Resp 18

## 2022-07-23 DIAGNOSIS — C50412 Malignant neoplasm of upper-outer quadrant of left female breast: Secondary | ICD-10-CM | POA: Insufficient documentation

## 2022-07-23 DIAGNOSIS — Z79818 Long term (current) use of other agents affecting estrogen receptors and estrogen levels: Secondary | ICD-10-CM | POA: Diagnosis present

## 2022-07-23 MED ORDER — GOSERELIN ACETATE 3.6 MG ~~LOC~~ IMPL
3.6000 mg | DRUG_IMPLANT | SUBCUTANEOUS | Status: DC
  Administered 2022-07-23: 3.6 mg via SUBCUTANEOUS
  Filled 2022-07-23: qty 3.6

## 2022-07-28 ENCOUNTER — Other Ambulatory Visit: Payer: Self-pay | Admitting: Internal Medicine

## 2022-08-04 ENCOUNTER — Other Ambulatory Visit (HOSPITAL_BASED_OUTPATIENT_CLINIC_OR_DEPARTMENT_OTHER): Payer: Self-pay | Admitting: Cardiovascular Disease

## 2022-08-04 DIAGNOSIS — E876 Hypokalemia: Secondary | ICD-10-CM

## 2022-08-05 NOTE — Telephone Encounter (Signed)
Rx request sent to pharmacy.  

## 2022-08-12 ENCOUNTER — Encounter: Payer: Self-pay | Admitting: Oncology

## 2022-08-15 ENCOUNTER — Inpatient Hospital Stay: Payer: 59

## 2022-08-20 ENCOUNTER — Inpatient Hospital Stay: Payer: 59 | Attending: Adult Health

## 2022-08-20 VITALS — BP 185/85 | HR 75 | Temp 98.8°F | Resp 18

## 2022-08-20 DIAGNOSIS — C50412 Malignant neoplasm of upper-outer quadrant of left female breast: Secondary | ICD-10-CM | POA: Diagnosis present

## 2022-08-20 DIAGNOSIS — Z79818 Long term (current) use of other agents affecting estrogen receptors and estrogen levels: Secondary | ICD-10-CM | POA: Insufficient documentation

## 2022-08-20 MED ORDER — GOSERELIN ACETATE 3.6 MG ~~LOC~~ IMPL
3.6000 mg | DRUG_IMPLANT | SUBCUTANEOUS | Status: DC
  Administered 2022-08-20: 3.6 mg via SUBCUTANEOUS
  Filled 2022-08-20: qty 3.6

## 2022-08-21 ENCOUNTER — Ambulatory Visit
Admission: RE | Admit: 2022-08-21 | Discharge: 2022-08-21 | Disposition: A | Discharge status: Home / Self Care | Payer: 59 | Source: Ambulatory Visit | Attending: Adult Health | Admitting: Adult Health

## 2022-08-21 DIAGNOSIS — C50412 Malignant neoplasm of upper-outer quadrant of left female breast: Secondary | ICD-10-CM

## 2022-08-21 DIAGNOSIS — Z171 Estrogen receptor negative status [ER-]: Secondary | ICD-10-CM

## 2022-08-24 ENCOUNTER — Other Ambulatory Visit: Payer: Self-pay | Admitting: Internal Medicine

## 2022-08-24 DIAGNOSIS — J4530 Mild persistent asthma, uncomplicated: Secondary | ICD-10-CM

## 2022-08-24 DIAGNOSIS — R058 Other specified cough: Secondary | ICD-10-CM

## 2022-09-04 ENCOUNTER — Other Ambulatory Visit: Payer: Self-pay | Admitting: Cardiovascular Disease

## 2022-09-05 NOTE — Telephone Encounter (Signed)
Please call pt to schedule overdue 4 month follow-up with APP per Dr. Duke Salvia. Last OV 11/2021. Sending in 90 day supply of med with no refills.

## 2022-09-05 NOTE — Telephone Encounter (Signed)
Rx request sent to pharmacy.  

## 2022-09-06 ENCOUNTER — Emergency Department (HOSPITAL_BASED_OUTPATIENT_CLINIC_OR_DEPARTMENT_OTHER)
Admission: EM | Admit: 2022-09-06 | Discharge: 2022-09-06 | Disposition: A | Discharge status: Home / Self Care | Payer: 59 | Attending: Emergency Medicine | Admitting: Emergency Medicine

## 2022-09-06 ENCOUNTER — Other Ambulatory Visit: Payer: Self-pay

## 2022-09-06 ENCOUNTER — Encounter (HOSPITAL_BASED_OUTPATIENT_CLINIC_OR_DEPARTMENT_OTHER): Payer: Self-pay

## 2022-09-06 DIAGNOSIS — H1033 Unspecified acute conjunctivitis, bilateral: Secondary | ICD-10-CM | POA: Diagnosis not present

## 2022-09-06 DIAGNOSIS — H5789 Other specified disorders of eye and adnexa: Secondary | ICD-10-CM | POA: Diagnosis present

## 2022-09-06 MED ORDER — OFLOXACIN 0.3 % OP SOLN: 1.0000 [drp] | Freq: Four times a day (QID) | OPHTHALMIC | 0 refills | Status: AC

## 2022-09-06 NOTE — Discharge Instructions (Signed)
Follow up with your family doc.  Follow up with your eye doc.  Return for change in vision.

## 2022-09-06 NOTE — ED Provider Notes (Signed)
EMERGENCY DEPARTMENT AT [facility] Provider Note   CSN: [id number] Arrival date & time: 09/06/22  1222     History  Chief Complaint  Patient presents with   Eye Problem    Erica [name] is a 47 y.o. female.  47 yo F with a chief complaints of bilateral eye redness and drainage.  Going on for a few days.  Started with the left eye feeling maybe there is something in it and she had rubbed it quite a bit and then noticed that it was in the other eye as well.  She denies any obvious foreign body to the eye.  Denies woodworking welding metal grinding.  She denies contact lens use.  Denies any change to her vision.   Eye Problem      Home Medications Prior to Admission medications   Medication Sig Start Date End Date Taking? Authorizing Provider  ofloxacin (OCUFLOX) 0.3 % ophthalmic solution Place 1 drop into both eyes 4 (four) times daily for 7 days. 09/06/22 09/13/22 Yes Melene Plan, DO  acetaZOLAMIDE (DIAMOX) 250 MG tablet Take 4 tablets (1,000 mg total) by mouth 3 (three) times daily. 07/19/19   Patel, Roxana Hires K, DO  albuterol (PROVENTIL) (2.5 MG/3ML) 0.083% nebulizer solution USE 1 VIAL VIA NEBULIZER EVERY 4 HOURS AND AS NEEDED FOR WHEEZING OR SHORTNESS OF BREATH 05/07/22   Cobb, Ruby Cola, NP  albuterol (VENTOLIN HFA) 108 (90 Base) MCG/ACT inhaler TAKE 2 PUFFS BY MOUTH EVERY 6 HOURS AS NEEDED FOR WHEEZE OR SHORTNESS OF BREATH 08/27/22   Cobb, Ruby Cola, NP  AMBULATORY NON FORMULARY MEDICATION Carpal Tunnel Wrist brace. Rt  Carpal Tunnel Syndrome G56.01  Disp 1 08/03/20   Rodolph Bong, MD  AMBULATORY NON FORMULARY MEDICATION Lace up ankle brace (ASO type)  Ankle sprain S93.491A  Disp 1 08/03/20   Rodolph Bong, MD  amitriptyline (ELAVIL) 100 MG tablet TAKE 1 TABLET BY MOUTH EVERYDAY AT BEDTIME 12/28/18   [name], MD  amLODipine (NORVASC) 10 MG tablet TAKE 1 TABLET BY MOUTH EVERY DAY 12/10/21   Chilton Si, MD  azithromycin [facility]) 250 MG tablet  Take 2 tablets on day #1; then 1 tablet daily x 4 days 03/19/22   Glenford Bayley, NP  Biotin 5000 MCG TABS Take 5,000 mg by mouth 2 (two) times daily.     [provider]  budesonide-formoterol (SYMBICORT) 160-4.5 MCG/ACT inhaler INHALE 2 PUFFS INTO LUNGS 2 TIMES DAILY AS DIRECTED 05/03/22   Nyoka Cowden, MD  chlorthalidone (HYGROTON) 25 MG tablet TAKE 1 TABLET (25 MG TOTAL) BY MOUTH DAILY. 06/27/22 06/22/23  Chilton Si, MD  Cholecalciferol (VITAMIN D) 50 MCG (2000 UT) tablet Take 2,000 Units by mouth 4 (four) times a week.    [provider]  cloNIDine (CATAPRES - DOSED IN MG/24 HR) 0.3 mg/24hr patch PLACE 2 PATCHES ONTO SKIN ONCE WEEKLY 06/10/22   Chilton Si, MD  clopidogrel (PLAVIX) 75 MG tablet TAKE 1 TABLET BY MOUTH EVERY DAY 05/07/22   Chilton Si, MD  clotrimazole (MYCELEX) 10 MG troche TAKE 1 TABLET BY MOUTH 5 TIMES DAILY. 07/30/22   Nyoka Cowden, MD  COVID-19 mRNA bivalent vaccine, Moderna, (MODERNA COVID-19 BIVAL BOOSTER) 50 MCG/0.5ML injection Inject into the muscle. 03/01/21   Judyann Munson, MD  dexlansoprazole (DEXILANT) 60 MG capsule Take 1 capsule (60 mg total) by mouth daily. Patient taking differently: Take 60 mg by mouth daily. Pt taking 90 MG 02/10/20   Pyrtle, Carie Caddy, MD  dextromethorphan-guaiFENesin (MUCINEX DM) 30-600 MG 12hr tablet Take 2 tablets by mouth 2 (two) times daily.    [provider]  diltiazem (CARDIZEM CD) 240 MG 24 hr capsule TAKE 1 CAPSULE BY MOUTH EVERY DAY 12/10/21   Chilton Si, MD  doxazosin (CARDURA) 8 MG tablet TAKE 2 TABLETS BY MOUTH EVERY DAY AT BEDTIME 06/27/22   Chilton Si, MD  DULoxetine (CYMBALTA) 30 MG capsule TAKE 1 CAPSULE (30 MG TOTAL) BY MOUTH DAILY. WITH 60MG  FOR TOTAL OF 90 MG Patient taking differently: Take 30 mg by mouth See admin instructions. With 60mg  for total of 90 mg 04/05/20   Nita Sickle K, DO  DULoxetine (CYMBALTA) 60 MG capsule TAKE 1 CAPSULE (60 MG TOTAL) BY MOUTH DAILY.  FOR TOTAL OF 90 MG Patient taking differently: Take 60 mg by mouth See admin instructions. Take with 30 mg for a total of 90 mg in the morning 04/05/20   Nita Sickle K, DO  ENTRESTO 97-103 MG TAKE 1 TABLET BY MOUTH TWICE A DAY 06/10/22   Chilton Si, MD  EPINEPHRINE 0.3 mg/0.3 mL IJ SOAJ injection INJECT 0.3 MLS INTO THE MUSCLE ONCE AS NEEDED FOR ANAPHYLAXIS 12/10/16   [name], MD  Evolocumab (REPATHA SURECLICK) 140 MG/ML SOAJ Inject 1 mL into the skin every 14 (fourteen) days. 12/04/21   Chilton Si, MD  ezetimibe (ZETIA) 10 MG tablet TAKE 1 TABLET BY MOUTH EVERY DAY 05/07/22   Chilton Si, MD  famotidine (PEPCID) 20 MG tablet Take 1-2 tablets (20-40 mg total) by mouth at bedtime. Patient taking differently: Take 40 mg by mouth at bedtime. 02/10/20   Pyrtle, Carie Caddy, MD  gabapentin (NEURONTIN) 300 MG capsule Take 1 capsule (300 mg total) by mouth 3 (three) times daily. One three times daily Patient taking differently: Take 300 mg by mouth 3 (three) times daily. 11/23/19   Rodolph Bong, MD  hydrALAZINE (APRESOLINE) 50 MG tablet TAKE 1 TABLET BY MOUTH THREE TIMES A DAY 06/10/22   Chilton Si, MD  lidocaine-prilocaine (EMLA) cream Apply 1 application topically every 28 (twenty-eight) days. Apply 1 application to skin ever 28 days 02/11/17   [provider]  metoCLOPramide (REGLAN) 10 MG tablet TAKE 1 TABLET BY MOUTH 4 TIMES DAILY. Patient taking differently: Take 10 mg by mouth 4 (four) times daily -  before meals and at bedtime. 08/31/20   Pyrtle, Carie Caddy, MD  metolazone (ZAROXOLYN) 2.5 MG tablet TAKE 1 TABLET BY MOUTH ONCE A WEEK. 02/18/22   Alver Sorrow, NP  minoxidil (LONITEN) 2.5 MG tablet TAKE 2 TABLETS (5 MG TOTAL) BY MOUTH 2 (TWO) TIMES DAILY 09/05/22   Chilton Si, MD  montelukast (SINGULAIR) 10 MG tablet TAKE 1 TABLET BY MOUTH EVERYDAY AT BEDTIME 03/26/22   Nyoka Cowden, MD  Nebivolol HCl 20 MG TABS TAKE 2 TABLETS BY MOUTH EVERY DAY 03/26/22    Chilton Si, MD  nitroGLYCERIN (NITROSTAT) 0.4 MG SL tablet PLACE 1 TABLET UNDER THE TONGUE EVERY 5 MINUTES AS NEEDED FOR CHEST PAIN. 05/07/22   Chilton Si, MD  oxymetazoline [facility] SINUS-MAX FULL FORCE) 0.05 % nasal spray Place 1 spray into both nostrils 2 (two) times daily.    [provider]  Potassium Chloride ER 20 MEQ TBCR TAKE 2 TABLETS BY MOUTH TWICE A DAY 08/05/22   Chilton Si, MD  predniSONE (DELTASONE) 10 MG tablet 4 tabs for 2 days, then 3 tabs for 2 days, 2 tabs for 2 days, then 1 tab for 2 days, then  stop Patient not taking: Reported on 06/25/2022 03/19/22   Glenford Bayley, NP  primidone (MYSOLINE) 50 MG tablet Take 0.5 tablets (25 mg total) by mouth at bedtime. 07/19/19   Patel, Donika K, DO  promethazine (PHENERGAN) 25 MG tablet TAKE 1 TABLET (25 MG TOTAL) BY MOUTH 3 (THREE) TIMES DAILY AS NEEDED FOR NAUSEA OR VOMITING. 09/11/20   Pyrtle, Carie Caddy, MD  promethazine-dextromethorphan (PROMETHAZINE-DM) 6.25-15 MG/5ML syrup TAKE 5 MLS BY MOUTH 4 TIMES A DAY AS NEEDED FOR COUGH 07/19/21   Nyoka Cowden, MD  ranolazine (RANEXA) 1000 MG SR tablet TAKE 1 TABLET BY MOUTH TWICE A DAY 03/26/22   Chilton Si, MD  Respiratory Therapy Supplies (FLUTTER) DEVI Use as directed 03/24/15   Nyoka Cowden, MD  rosuvastatin (CRESTOR) 40 MG tablet Take 1 tablet (40 mg total) by mouth daily. 01/14/22   Chilton Si, MD  Spacer/Aero-Holding Chambers (AEROCHAMBER MV) inhaler Use as instructed 03/06/18   Nyoka Cowden, MD  spironolactone (ALDACTONE) 100 MG tablet TAKE 1 TABLET BY MOUTH EVERY DAY 05/07/22   Chilton Si, MD  Tdap Leda Min) 5-2.5-18.5 LF-MCG/0.5 injection Inject into the muscle. Patient not taking: Reported on 06/25/2022 12/06/21   Loa Socks, NP  torsemide (DEMADEX) 20 MG tablet TAKE 2 TABLETS BY MOUTH 2 TIMES DAILY. 03/26/22   Chilton Si, MD      Allergies    Aspirin, Doxycycline, Kiwi extract, Metoprolol, Penicillins, Strawberry  extract, and Sulfamethoxazole-trimethoprim    Review of Systems   Review of Systems  Physical Exam Updated Vital Signs BP (!) 160/118 (BP Location: Right Arm) Comment: PT states High BP is Normal  Pulse 91   Temp 98.4 F (36.9 C) (Oral)   Resp 18   LMP  (LMP Unknown)   SpO2 99%  Physical Exam Vitals and nursing note reviewed.  Constitutional:      General: She is not in acute distress.    Appearance: She is well-developed. She is not diaphoretic.  HENT:     Head: Normocephalic and atraumatic.  Eyes:     General: Lids are normal. Lids are everted, no foreign bodies appreciated.     Extraocular Movements: Extraocular movements intact.     Right eye: Normal extraocular motion.     Left eye: Normal extraocular motion.     Conjunctiva/sclera:     Right eye: Right conjunctiva is injected. Exudate present.     Left eye: Left conjunctiva is injected. Exudate present.     Pupils: Pupils are equal, round, and reactive to light.     Comments: Injection to bilateral eyes with copious discharge, purulent appearing in the corners of the eyes.  No obvious foreign body extraocular motion intact pupils equal round reactive to light  Cardiovascular:     Rate and Rhythm: Normal rate and regular rhythm.     Heart sounds: No murmur heard.    No friction rub. No gallop.  Pulmonary:     Effort: Pulmonary effort is normal.     Breath sounds: No wheezing or rales.  Abdominal:     General: There is no distension.     Palpations: Abdomen is soft.     Tenderness: There is no abdominal tenderness.  Musculoskeletal:        General: No tenderness.     Cervical back: Normal range of motion and neck supple.  Skin:    General: Skin is warm and dry.  Neurological:     Mental Status: She is alert and oriented to  person, place, and time.  Psychiatric:        Behavior: Behavior normal.     ED Results / Procedures / Treatments   Labs (all labs ordered are listed, but only abnormal results are  displayed) Labs Reviewed - No data to display  EKG None  Radiology No results found.  Procedures Procedures    Medications Ordered in ED Medications - No data to display  ED Course/ Medical Decision Making/ A&P                             Medical Decision Making Risk Prescription drug management.   47 yo F with a chief complaints of bilateral eye redness and drainage.  Clinically the patient has bacterial conjunctivitis.  Significant purulent drainage reported at home and some seen on exam.  Will start on topical antibiotic therapy.  She has an ophthalmologist.  12:47 PM:  I have discussed the diagnosis/risks/treatment options with the patient.  Evaluation and diagnostic testing in the emergency department does not suggest an emergent condition requiring admission or immediate intervention beyond what has been performed at this time.  They will follow up with PCP, optho. We also discussed returning to the ED immediately if new or worsening sx occur. We discussed the sx which are most concerning (e.g., sudden worsening pain, fever, inability to tolerate by mouth) that necessitate immediate return. Medications administered to the patient during their visit and any new prescriptions provided to the patient are listed below.  Medications given during this visit Medications - No data to display   The patient appears reasonably screen and/or stabilized for discharge and I doubt any other medical condition or other [facility] requiring further screening, evaluation, or treatment in the ED at this time prior to discharge.          Final Clinical Impression(s) / ED Diagnoses Final diagnoses:  Acute bacterial conjunctivitis of both eyes    Rx / DC Orders ED Discharge Orders          Ordered    ofloxacin (OCUFLOX) 0.3 % ophthalmic solution  4 times daily        09/06/22 1241              Melene Plan, DO 09/06/22 1247

## 2022-09-06 NOTE — ED Triage Notes (Signed)
Present with redden eyes onset tuesday

## 2022-09-06 NOTE — ED Notes (Signed)
Pt discharged to home using teachback Method. Discharge instructions have been discussed with patient and/or family members. Pt verbally acknowledges understanding d/c instructions, has been given opportunity for questions to be answered, and endorses comprehension to checkout at registration before leaving.  

## 2022-09-09 ENCOUNTER — Ambulatory Visit (INDEPENDENT_AMBULATORY_CARE_PROVIDER_SITE_OTHER): Payer: 59 | Admitting: Family Medicine

## 2022-09-09 ENCOUNTER — Other Ambulatory Visit: Payer: Self-pay

## 2022-09-09 ENCOUNTER — Encounter: Payer: Self-pay | Admitting: Family Medicine

## 2022-09-09 VITALS — BP 164/92 | HR 93 | Ht 65.0 in | Wt 215.0 lb

## 2022-09-09 DIAGNOSIS — M65342 Trigger finger, left ring finger: Secondary | ICD-10-CM

## 2022-09-09 DIAGNOSIS — M79645 Pain in left finger(s): Secondary | ICD-10-CM | POA: Diagnosis not present

## 2022-09-09 NOTE — Progress Notes (Unsigned)
   I, Stevenson Clinch, CMA acting as a scribe for Clementeen Graham, MD.  Erica [name] is a 47 y.o. female who presents to [facility] today for right shoulder and left hand/finger pain. Pt was last seen by Dr. Denyse Amass on 07/18/21 and was given a R GH steroid injection. She has also been seen for cervical radiculitis on 09/04/20.  Today, pt reports left finger pain. Pt locates pain to the 3rd and 4th left fingers. The fingers have been getting stuck x 4 days. The fingers are painful. Intermittent swelling in the hands, unchanged.  Right shoulder sx have been moderately to well managed. More recently, shoulder sx have started causing night disturbance. Pt is LHD.   Dx imaging: 09/04/20 R 2nd finger & C-spine XR  Pertinent review of systems: No fevers or chills  Relevant historical information: Asthma.  Chemotherapy-induced peripheral neuropathy.  History of breast cancer.   Exam:  BP (!) 164/92   Pulse 93   Ht 5\' 5"  (1.651 m)   Wt 215 lb (97.5 kg)   LMP  (LMP Unknown)   SpO2 97%   BMI 35.78 kg/m  General: Well Developed, well nourished, and in no acute distress.   MSK: Left hand normal appearing. Triggering present with flexion of IP joint at fourth digit.  Minimal triggering with third digit. Intact strength.   Lab and Radiology Results  Procedure: Real-time Ultrasound Guided Injection of left hand fourth digit A1 pulley tendon sheath (trigger finger injection) Device: Philips Affiniti 50G Images permanently stored and available for review in PACS Verbal informed consent obtained.  Discussed risks and benefits of procedure. Warned about infection, bleeding, hyperglycemia damage to structures among others. Patient expresses understanding and agreement Time-out conducted.   Noted no overlying erythema, induration, or other signs of local infection.   Skin prepped in a sterile fashion.   Local anesthesia: Topical Ethyl chloride.   With sterile technique and  under real time ultrasound guidance: 40 mg of Kenalog and 1 mL of lidocaine injected into tendon sheath at A1 pulley. Fluid seen entering the tendon sheath.   Completed without difficulty   Pain immediately resolved suggesting accurate placement of the medication.   Advised to call if fevers/chills, erythema, induration, drainage, or persistent bleeding.   Images permanently stored and available for review in the ultrasound unit.  Impression: Technically successful ultrasound guided injection.        Assessment and Plan: 47 y.o. female with left hand trigger finger significant at the fourth digit and mild to minimal at the third digit.  Plan for trigger finger injection fourth digit today.  She wants to hold off on the third digit I think is okay.  Will try double Band-Aid splint Voltaren gel.  Check back as needed.   PDMP not reviewed this encounter. Orders Placed This Encounter  Procedures   [place] LIMITED JOINT SPACE STRUCTURES UP LEFT(NO LINKED CHARGES)    Order Specific Question:   Reason for Exam (SYMPTOM  OR DIAGNOSIS REQUIRED)    Answer:   left trigger finger(s)    Order Specific Question:   Preferred imaging location?    Answer:   [facility] [place]   No orders of the defined types were placed in this encounter.    Discussed warning signs or symptoms. Please see discharge instructions. Patient expresses understanding.   The above documentation has been reviewed and is accurate and complete Clementeen Graham, M.D.

## 2022-09-09 NOTE — Telephone Encounter (Signed)
Scheduled 10/02/22 with Gillian Shields, NP

## 2022-09-09 NOTE — Patient Instructions (Addendum)
Thank you for coming in today.   You received an injection today. Seek immediate medical attention if the joint becomes red, extremely painful, or is oozing fluid.   Use the Double Band-aid Splint  Let me know next week and I can inject the other finger

## 2022-09-12 ENCOUNTER — Inpatient Hospital Stay: Payer: 59

## 2022-09-17 ENCOUNTER — Inpatient Hospital Stay: Payer: 59 | Attending: Adult Health

## 2022-09-17 ENCOUNTER — Other Ambulatory Visit: Payer: Self-pay

## 2022-09-17 VITALS — BP 170/92 | HR 83 | Resp 18

## 2022-09-17 DIAGNOSIS — Z79818 Long term (current) use of other agents affecting estrogen receptors and estrogen levels: Secondary | ICD-10-CM | POA: Diagnosis present

## 2022-09-17 DIAGNOSIS — C50412 Malignant neoplasm of upper-outer quadrant of left female breast: Secondary | ICD-10-CM | POA: Diagnosis present

## 2022-09-17 MED ORDER — GOSERELIN ACETATE 3.6 MG ~~LOC~~ IMPL
3.6000 mg | DRUG_IMPLANT | SUBCUTANEOUS | Status: DC
  Administered 2022-09-17: 3.6 mg via SUBCUTANEOUS
  Filled 2022-09-17: qty 3.6

## 2022-09-21 ENCOUNTER — Emergency Department (HOSPITAL_BASED_OUTPATIENT_CLINIC_OR_DEPARTMENT_OTHER)
Admission: EM | Admit: 2022-09-21 | Discharge: 2022-09-21 | Disposition: A | Discharge status: Home / Self Care | Payer: 59 | Attending: Emergency Medicine | Admitting: Emergency Medicine

## 2022-09-21 ENCOUNTER — Emergency Department (HOSPITAL_BASED_OUTPATIENT_CLINIC_OR_DEPARTMENT_OTHER): Payer: 59 | Admitting: Radiology

## 2022-09-21 ENCOUNTER — Encounter (HOSPITAL_BASED_OUTPATIENT_CLINIC_OR_DEPARTMENT_OTHER): Payer: Self-pay

## 2022-09-21 DIAGNOSIS — M25571 Pain in right ankle and joints of right foot: Secondary | ICD-10-CM | POA: Insufficient documentation

## 2022-09-21 DIAGNOSIS — I10 Essential (primary) hypertension: Secondary | ICD-10-CM | POA: Diagnosis not present

## 2022-09-21 DIAGNOSIS — Z79899 Other long term (current) drug therapy: Secondary | ICD-10-CM | POA: Insufficient documentation

## 2022-09-21 NOTE — ED Triage Notes (Signed)
Pt states she has a knot on the inside of right ankle since yesterday. Pt denies injury or trauma, but states she is clumsy.

## 2022-09-21 NOTE — ED Provider Notes (Signed)
[place] EMERGENCY DEPARTMENT AT [facility] Provider Note   CSN: [id number] Arrival date & time: 09/21/22  8295     History {Add pertinent medical, surgical, social history, OB history to HPI:1} Chief Complaint  Patient presents with  . Ankle Pain    Erica [name] is a 47 y.o. female.   Ankle Pain        Home Medications Prior to Admission medications   Medication Sig Start Date End Date Taking? Authorizing Provider  acetaZOLAMIDE (DIAMOX) 250 MG tablet Take 4 tablets (1,000 mg total) by mouth 3 (three) times daily. 07/19/19   Patel, Roxana Hires K, DO  albuterol (PROVENTIL) (2.5 MG/3ML) 0.083% nebulizer solution USE 1 VIAL VIA NEBULIZER EVERY 4 HOURS AND AS NEEDED FOR WHEEZING OR SHORTNESS OF BREATH 05/07/22   Cobb, Ruby Cola, NP  albuterol (VENTOLIN HFA) 108 (90 Base) MCG/ACT inhaler TAKE 2 PUFFS BY MOUTH EVERY 6 HOURS AS NEEDED FOR WHEEZE OR SHORTNESS OF BREATH 08/27/22   Cobb, Ruby Cola, NP  AMBULATORY NON FORMULARY MEDICATION Carpal Tunnel Wrist brace. Rt  Carpal Tunnel Syndrome G56.01  Disp 1 08/03/20   Rodolph Bong, MD  AMBULATORY NON FORMULARY MEDICATION Lace up ankle brace (ASO type)  Ankle sprain S93.491A  Disp 1 08/03/20   Rodolph Bong, MD  amitriptyline (ELAVIL) 100 MG tablet TAKE 1 TABLET BY MOUTH EVERYDAY AT BEDTIME 12/28/18   [name], MD  amLODipine (NORVASC) 10 MG tablet TAKE 1 TABLET BY MOUTH EVERY DAY 12/10/21   Chilton Si, MD  azithromycin [facility]) 250 MG tablet Take 2 tablets on day #1; then 1 tablet daily x 4 days 03/19/22   Glenford Bayley, NP  Biotin 5000 MCG TABS Take 5,000 mg by mouth 2 (two) times daily.     [provider]  budesonide-formoterol (SYMBICORT) 160-4.5 MCG/ACT inhaler INHALE 2 PUFFS INTO LUNGS 2 TIMES DAILY AS DIRECTED 05/03/22   Nyoka Cowden, MD  chlorthalidone (HYGROTON) 25 MG tablet TAKE 1 TABLET (25 MG TOTAL) BY MOUTH DAILY. 06/27/22 06/22/23  Chilton Si, MD  Cholecalciferol (VITAMIN D)  50 MCG (2000 UT) tablet Take 2,000 Units by mouth 4 (four) times a week.    [provider]  cloNIDine (CATAPRES - DOSED IN MG/24 HR) 0.3 mg/24hr patch PLACE 2 PATCHES ONTO SKIN ONCE WEEKLY 06/10/22   Chilton Si, MD  clopidogrel (PLAVIX) 75 MG tablet TAKE 1 TABLET BY MOUTH EVERY DAY 05/07/22   Chilton Si, MD  clotrimazole (MYCELEX) 10 MG troche TAKE 1 TABLET BY MOUTH 5 TIMES DAILY. 07/30/22   Nyoka Cowden, MD  COVID-19 mRNA bivalent vaccine, Moderna, (MODERNA COVID-19 BIVAL BOOSTER) 50 MCG/0.5ML injection Inject into the muscle. 03/01/21   Judyann Munson, MD  dexlansoprazole (DEXILANT) 60 MG capsule Take 1 capsule (60 mg total) by mouth daily. Patient taking differently: Take 60 mg by mouth daily. Pt taking 90 MG 02/10/20   Pyrtle, Carie Caddy, MD  dextromethorphan-guaiFENesin [facility] DM) 30-600 MG 12hr tablet Take 2 tablets by mouth 2 (two) times daily.    [provider]  diltiazem (CARDIZEM CD) 240 MG 24 hr capsule TAKE 1 CAPSULE BY MOUTH EVERY DAY 12/10/21   Chilton Si, MD  doxazosin (CARDURA) 8 MG tablet TAKE 2 TABLETS BY MOUTH EVERY DAY AT BEDTIME 06/27/22   Chilton Si, MD  DULoxetine (CYMBALTA) 30 MG capsule TAKE 1 CAPSULE (30 MG TOTAL) BY MOUTH DAILY. WITH 60MG  FOR TOTAL OF 90 MG Patient taking differently: Take 30 mg by mouth See admin  instructions. With 60mg  for total of 90 mg 04/05/20   Nita Sickle K, DO  DULoxetine (CYMBALTA) 60 MG capsule TAKE 1 CAPSULE (60 MG TOTAL) BY MOUTH DAILY. FOR TOTAL OF 90 MG Patient taking differently: Take 60 mg by mouth See admin instructions. Take with 30 mg for a total of 90 mg in the morning 04/05/20   Nita Sickle K, DO  ENTRESTO 97-103 MG TAKE 1 TABLET BY MOUTH TWICE A DAY 06/10/22   Chilton Si, MD  EPINEPHRINE 0.3 mg/0.3 mL IJ SOAJ injection INJECT 0.3 MLS INTO THE MUSCLE ONCE AS NEEDED FOR ANAPHYLAXIS 12/10/16   [name], MD  Evolocumab (REPATHA SURECLICK) 140 MG/ML SOAJ Inject 1 mL into the skin every  14 (fourteen) days. 12/04/21   Chilton Si, MD  ezetimibe (ZETIA) 10 MG tablet TAKE 1 TABLET BY MOUTH EVERY DAY 05/07/22   Chilton Si, MD  famotidine (PEPCID) 20 MG tablet Take 1-2 tablets (20-40 mg total) by mouth at bedtime. Patient taking differently: Take 40 mg by mouth at bedtime. 02/10/20   Pyrtle, Carie Caddy, MD  gabapentin (NEURONTIN) 300 MG capsule Take 1 capsule (300 mg total) by mouth 3 (three) times daily. One three times daily Patient taking differently: Take 300 mg by mouth 3 (three) times daily. 11/23/19   Rodolph Bong, MD  hydrALAZINE (APRESOLINE) 50 MG tablet TAKE 1 TABLET BY MOUTH THREE TIMES A DAY 06/10/22   Chilton Si, MD  lidocaine-prilocaine (EMLA) cream Apply 1 application topically every 28 (twenty-eight) days. Apply 1 application to skin ever 28 days 02/11/17   [provider]  metoCLOPramide (REGLAN) 10 MG tablet TAKE 1 TABLET BY MOUTH 4 TIMES DAILY. Patient taking differently: Take 10 mg by mouth 4 (four) times daily -  before meals and at bedtime. 08/31/20   Pyrtle, Carie Caddy, MD  metolazone (ZAROXOLYN) 2.5 MG tablet TAKE 1 TABLET BY MOUTH ONCE A WEEK. 02/18/22   Alver Sorrow, NP  minoxidil (LONITEN) 2.5 MG tablet TAKE 2 TABLETS (5 MG TOTAL) BY MOUTH 2 (TWO) TIMES DAILY 09/05/22   Chilton Si, MD  montelukast (SINGULAIR) 10 MG tablet TAKE 1 TABLET BY MOUTH EVERYDAY AT BEDTIME 03/26/22   Nyoka Cowden, MD  Nebivolol HCl 20 MG TABS TAKE 2 TABLETS BY MOUTH EVERY DAY 03/26/22   Chilton Si, MD  nitroGLYCERIN (NITROSTAT) 0.4 MG SL tablet PLACE 1 TABLET UNDER THE TONGUE EVERY 5 MINUTES AS NEEDED FOR CHEST PAIN. 05/07/22   Chilton Si, MD  oxymetazoline [facility] SINUS-MAX FULL FORCE) 0.05 % nasal spray Place 1 spray into both nostrils 2 (two) times daily.    [provider]  Potassium Chloride ER 20 MEQ TBCR TAKE 2 TABLETS BY MOUTH TWICE A DAY 08/05/22   Chilton Si, MD  predniSONE (DELTASONE) 10 MG tablet 4 tabs for 2 days, then 3  tabs for 2 days, 2 tabs for 2 days, then 1 tab for 2 days, then stop 03/19/22   Glenford Bayley, NP  primidone (MYSOLINE) 50 MG tablet Take 0.5 tablets (25 mg total) by mouth at bedtime. 07/19/19   Patel, Donika K, DO  promethazine (PHENERGAN) 25 MG tablet TAKE 1 TABLET (25 MG TOTAL) BY MOUTH 3 (THREE) TIMES DAILY AS NEEDED FOR NAUSEA OR VOMITING. 09/11/20   Pyrtle, Carie Caddy, MD  promethazine-dextromethorphan (PROMETHAZINE-DM) 6.25-15 MG/5ML syrup TAKE 5 MLS BY MOUTH 4 TIMES A DAY AS NEEDED FOR COUGH 07/19/21   Nyoka Cowden, MD  ranolazine (RANEXA) 1000 MG SR tablet TAKE 1  TABLET BY MOUTH TWICE A DAY 03/26/22   Chilton Si, MD  Respiratory Therapy Supplies (FLUTTER) DEVI Use as directed 03/24/15   Nyoka Cowden, MD  rosuvastatin (CRESTOR) 40 MG tablet Take 1 tablet (40 mg total) by mouth daily. 01/14/22   Chilton Si, MD  Spacer/Aero-Holding Chambers (AEROCHAMBER MV) inhaler Use as instructed 03/06/18   Nyoka Cowden, MD  spironolactone (ALDACTONE) 100 MG tablet TAKE 1 TABLET BY MOUTH EVERY DAY 05/07/22   Chilton Si, MD  Tdap Leda Min) 5-2.5-18.5 LF-MCG/0.5 injection Inject into the muscle. 12/06/21   Loa Socks, NP  torsemide (DEMADEX) 20 MG tablet TAKE 2 TABLETS BY MOUTH 2 TIMES DAILY. 03/26/22   Chilton Si, MD      Allergies    Aspirin, Doxycycline, Kiwi extract, Metoprolol, Penicillins, Strawberry extract, and Sulfamethoxazole-trimethoprim    Review of Systems   Review of Systems  Physical Exam Updated Vital Signs BP (!) 194/111   Pulse 89   Temp 98.3 F (36.8 C)   Resp 14   Ht 5\' 5"  (1.651 m)   Wt 99.8 kg   LMP  (LMP Unknown)   SpO2 97%   BMI 36.61 kg/m  Physical Exam  ED Results / Procedures / Treatments   Labs (all labs ordered are listed, but only abnormal results are displayed) Labs Reviewed - No data to display  EKG None  Radiology DG Ankle Complete Right  Result Date: 09/21/2022 CLINICAL DATA:  Complains of right ankle  pain for 1 day without a known injury. History of remote tendon injury. EXAM: RIGHT ANKLE - COMPLETE 3+ VIEW COMPARISON:  08/03/2020 FINDINGS: No sign of acute fracture or subluxation. Similar appearance of sub chondral cyst along the medial aspect of the talar dome compatible with arthropathic changes. Soft tissues are unremarkable. IMPRESSION: 1. No acute findings. Electronically Signed   By: Signa Kell M.D.   On: 09/21/2022 10:58    Procedures Procedures  {Document cardiac monitor, telemetry assessment procedure when appropriate:1}  Medications Ordered in ED Medications - No data to display  ED Course/ Medical Decision Making/ A&P   {   Click here for ABCD2, HEART and other calculatorsREFRESH Note before signing :1}                          Medical Decision Making Amount and/or Complexity of Data Reviewed Radiology: ordered.   ***  {Document critical care time when appropriate:1} {Document review of labs and clinical decision tools ie heart score, Chads2Vasc2 etc:1}  {Document your independent review of radiology images, and any outside records:1} {Document your discussion with family members, caretakers, and with consultants:1} {Document social determinants of health affecting pt's care:1} {Document your decision making why or why not admission, treatments were needed:1} Final Clinical Impression(s) / ED Diagnoses Final diagnoses:  None    Rx / DC Orders ED Discharge Orders     None

## 2022-09-21 NOTE — Discharge Instructions (Signed)
Ice and elevate your foot as we discussed.  Use the walking boot as needed.  You may ultimately follow-up with either a podiatrist or an orthopedist however I do recommend that you wait a week and see your primary care doctor first.  Please use Tylenol or ibuprofen for pain.  You may use 600 mg ibuprofen every 6 hours or 1000 mg of Tylenol every 6 hours.  You may choose to alternate between the 2.  This would be most effective.  Not to exceed 4 g of Tylenol within 24 hours.  Not to exceed 3200 mg ibuprofen 24 hours.

## 2022-09-21 NOTE — ED Notes (Signed)
Pt discharged home after verbalizing understanding of discharge instructions; nad noted. 

## 2022-10-01 ENCOUNTER — Other Ambulatory Visit: Payer: Self-pay

## 2022-10-01 ENCOUNTER — Ambulatory Visit (INDEPENDENT_AMBULATORY_CARE_PROVIDER_SITE_OTHER): Payer: 59 | Admitting: Family Medicine

## 2022-10-01 ENCOUNTER — Encounter: Payer: Self-pay | Admitting: Family Medicine

## 2022-10-01 VITALS — BP 180/94 | HR 62 | Ht 65.0 in | Wt 212.6 lb

## 2022-10-01 DIAGNOSIS — M25571 Pain in right ankle and joints of right foot: Secondary | ICD-10-CM | POA: Diagnosis not present

## 2022-10-01 NOTE — Patient Instructions (Addendum)
Thank you for coming in today.   Wear the CAM walker boot  Try to wean out of the boot as soon as you feel able.   Check back as needed

## 2022-10-01 NOTE — Progress Notes (Signed)
I, Stevenson Clinch, CMA acting as a scribe for Clementeen Graham, MD.  Erica [name] is a 47 y.o. female who presents to [facility] at [facility] today for R ankle pain. Pt was previously seen by Dr. Denyse Amass on 09/09/22 for L 4th trigger finger.   Today, pt c/o R ankle pain x 1.5 weeks. Pt was seen at the [facility] ED for this issue on June 22nd and radiology read of the XR appreciated a "sub-chondral cyst along the medial aspect of the talar dome compatible with arthropathic changes" in her ankle. No MOI. Pt locates pain to medial aspect of the ankle. Sx started the day after doing a lot of standing. Noticed palpable knot at medical aspect of the ankle while at church, went to the ED the following day and was referred back here. The area is tender to touch, eve light touch. Per pt, looks blue in color. Denies increased warmth. of ovarian cyst and gets cyst under the breast.  R ankle swelling: no Aggravates: touch Treatments tried: ice, heat, elevation  Dx imaging: 09/21/22 R ankle XR  Pertinent review of systems: No fevers or chills  Relevant historical information: Breast cancer.  Heart disease and hypertension.   Exam:  BP (!) 180/94   Pulse 62   Ht 5\' 5"  (1.651 m)   Wt 212 lb 9.6 oz (96.4 kg)   LMP  (LMP Unknown)   SpO2 97%   BMI 35.38 kg/m  General: Well Developed, well nourished, and in no acute distress.   MSK: Right ankle mild effusion medial ankle otherwise normal. Normal foot and ankle motion.  Some pain with resisted foot eversion.    Lab and Radiology Results  Diagnostic Limited MSK Ultrasound of: Right medial ankle Minimal tenosynovitis posterior tibialis tendon.   Minimal ankle effusion medially. Impression: Unclear cause medial ankle pain.  EXAM: RIGHT ANKLE - COMPLETE 3+ VIEW   COMPARISON:  08/03/2020   FINDINGS: No sign of acute fracture or subluxation. Similar appearance of sub chondral cyst along the medial aspect of the talar dome  compatible with arthropathic changes. Soft tissues are unremarkable.   IMPRESSION: 1. No acute findings.     Electronically Signed   By: Signa Kell M.D.   On: 09/21/2022 10:58 I, Clementeen Graham, personally (independently) visualized and performed the interpretation of the images attached in this note.    Assessment and Plan: 47 y.o. female with right ankle pain and swelling medially.  She does have these unchanged appearance of the subchondral cysts medial talar dome.  This is previously seen 2 years ago and ankle x-ray not much change today.  This could reflect arthritis and probably is a source of some of her pain.  She could have some posterior tibialis tenosynovitis as well which could be part of her pain.  Plan for limited trial ankle boot and a bit of watchful waiting.  If not improving consider steroid injection.  Also could consider MRI to further characterize ankle pain.   PDMP not reviewed this encounter. Orders Placed This Encounter  Procedures   [place] LIMITED JOINT SPACE STRUCTURES LOW RIGHT(NO LINKED CHARGES)    Order Specific Question:   Reason for Exam (SYMPTOM  OR DIAGNOSIS REQUIRED)    Answer:   right ankle    Order Specific Question:   Preferred imaging location?    Answer:   [facility] [place]   No orders of the defined types were placed in this encounter.    Discussed  warning signs or symptoms. Please see discharge instructions. Patient expresses understanding.   The above documentation has been reviewed and is accurate and complete Lynne Leader, M.D.

## 2022-10-02 ENCOUNTER — Ambulatory Visit (INDEPENDENT_AMBULATORY_CARE_PROVIDER_SITE_OTHER): Payer: 59 | Admitting: Family

## 2022-10-02 ENCOUNTER — Encounter (HOSPITAL_BASED_OUTPATIENT_CLINIC_OR_DEPARTMENT_OTHER): Payer: Self-pay | Admitting: Family

## 2022-10-02 VITALS — BP 146/80 | HR 94 | Ht 65.0 in | Wt 212.0 lb

## 2022-10-02 DIAGNOSIS — I10 Essential (primary) hypertension: Secondary | ICD-10-CM | POA: Diagnosis not present

## 2022-10-02 DIAGNOSIS — I1A0 Resistant hypertension: Secondary | ICD-10-CM

## 2022-10-02 DIAGNOSIS — I471 Supraventricular tachycardia, unspecified: Secondary | ICD-10-CM

## 2022-10-02 DIAGNOSIS — R0602 Shortness of breath: Secondary | ICD-10-CM

## 2022-10-02 DIAGNOSIS — G4733 Obstructive sleep apnea (adult) (pediatric): Secondary | ICD-10-CM

## 2022-10-02 DIAGNOSIS — I701 Atherosclerosis of renal artery: Secondary | ICD-10-CM

## 2022-10-02 DIAGNOSIS — Z6835 Body mass index (BMI) 35.0-35.9, adult: Secondary | ICD-10-CM

## 2022-10-02 DIAGNOSIS — E785 Hyperlipidemia, unspecified: Secondary | ICD-10-CM

## 2022-10-02 DIAGNOSIS — I25118 Atherosclerotic heart disease of native coronary artery with other forms of angina pectoris: Secondary | ICD-10-CM

## 2022-10-02 MED ORDER — DILTIAZEM HCL ER COATED BEADS 300 MG PO CP24
300.0000 mg | ORAL_CAPSULE | Freq: Every day | ORAL | 3 refills | Status: DC
Start: 2022-10-02 — End: 2023-01-09

## 2022-10-02 NOTE — Patient Instructions (Addendum)
Medication Instructions:  Your physician has recommended you make the following change in your medication:   Increase: Diltiazem 300mg  daily   *If you need a refill on your cardiac medications before your next appointment, please call your pharmacy*   Lab Work: Your physician recommends that you return for lab work today- Cmp, CBC, Mag, Thyroid Panel, Lipid panel, A1C  If you have labs (blood work) drawn today and your tests are completely normal, you will receive your results only by: MyChart Message (if you have MyChart) OR A paper copy in the mail If you have any lab test that is abnormal or we need to change your treatment, we will call you to review the results.   Testing/Procedures: Your physician has requested that you have a renal artery duplex. During this test, an ultrasound is used to evaluate blood flow to the kidneys. Allow one hour for this exam. Do not eat after midnight the day before and avoid carbonated beverages. Take your medications as you usually do.  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  As part of our continuing mission to provide you with exceptional heart care, we have created designated Provider Care Teams.  These Care Teams include your primary Cardiologist (physician) and Advanced Practice Providers (APPs -  Physician Assistants and Nurse Practitioners) who all work together to provide you with the care you need, when you need it.  We recommend signing up for the patient portal called "MyChart".  Sign up information is provided on this After Visit Summary.  MyChart is used to connect with patients for Virtual Visits (Telemedicine).  Patients are able to view lab/test results, encounter notes, upcoming appointments, etc.  Non-urgent messages can be sent to your provider as well.   To learn more about what you can do with MyChart, go to [email or web address].     Your next appointment:   3 months with Dr. Duke Salvia or Gillian Shields, NP

## 2022-10-02 NOTE — Progress Notes (Signed)
Cardiology Office Note:  .   Date:  10/02/2022  ID:  Erica [name], DOB 08-Jan-1976, MRN [id number] PCP: Aviva Kluver  [facility] Providers Cardiologist:  Chilton Si, MD    History of Present Illness: .   Erica [name] is a 47 y.o. female with a hx of resistant hypertension, chronic diastolic heart failure, breast cancer s/p chemo and XRT, morbid obesity, OSA on BIPAP, renal artery stenosis, CAD, HLD last seen 12/04/21   She has history of hypertension dating back to treatment of breast cancer in 2010. She has established with Dr. Duke Salvia in the hypertension clinic. Previous echocardiogram 03/2018 LVEF 45-50%, grade 2 diastolic dysfunction, mild MR, PASP 33. Renal artery duppler 2019 with bilateral 1-59% stenosis. CT-A abdomen with 1.3 cm renal cyst but no adrenal adenomas. No evidence of renal artery stenosis.    She has a hx of recurrent syncope. 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 She has sleep apnea but is intolerant of CPAP and now uses BIPAP. She had [facility] 05/2019 with finding of 60% distal LCx and 80% distal LAD disease. She was started on Clopidogrel due to allergy to aspirin. LE duplex 10/2019 for lower extremity edema was negative for DVT.    There have been concerns previously about whether she is taking medications as prescribed given discrepancies between what was ordered versus picked up from the pharmacy. She reports taking medications as prescribed.    Blood pressure has been difficult to control. Doxazosin ordered at initial clinic visit. Amlodipine subsequently added. She has been given Metolazone in addition to Torsemide due to edema.  Her hydralazine was reduced to 50mg  TID and doxazosin increased to 8mg  twice daily to try to reduce TID dosing. Ranexa increased due to chest pain with improvement. Clonidine increased to 0.6mg  patch. Monitor 03/23/20  showing PVC with up to 8 beats of SVT.     Seen in follow up 10/23/20. She  was scheduled for [facility] which was performed 10/25/20 with distal LAD 80% stenosed, LPAV lesion 30% stenosed with 50% stenosed side branch in first LPL. Hyperdynamic LV systolic function was noted. Hemodynamic findings were consistent with mild secondary pulmonary hypertension. It was overall unchanged from previous. Angina was suspected due to accelerated hypertension, increased wall stress, and microvascular ischemia. Distal LAD not favorable for PCI but could be considered for intractable pain. BP markedly elevated 200s/110s which improved after medication to 152/78.   Echo 04/05/21 normal LVEF 55-60%, mild LVH, normal diastolic parameters, mildly elevated PASP, bilateral atria moderately dilated, mild MR. Based on follow up labs her Metolazone was reduced from 5mg  to 2.5mg  weekly.  Minoxidil was increased to 5 mg twice daily.   At follow up 05/2021 BP 150s/70s at home. She was not taking Doxazosin nor Minoxidil and these were resumed. At follow up 07/16/21 with Dr. Duke Salvia BP remained poorly controlled. HCTZ transitioned to Chlorthalidone. Doxasozin dose increased. Renal denervation unable to be done through research study as she was on too many BP medications. She wore a 24h BP monitor with average daytime BP 135/68 and during white coat period 182/96. Average overall  BP on monitor 127/65.    Last seen 12/04/21. BP on 24h monitor markedly lower than her usual home readings calling into question accuracy of data. There was plan to repeat 24h BP with different machine but does not appear preformed. It was noted there were no additional antihypertensives to be added with plan to refer for  renal denervation once FDA approved.   Presents today for follow up independently. Is presently in a boot per sports medicine due to right ankle pain and swelling. Notes episodes of palpitations with sensation of heart racing which then leads to chest and arm discomfort. No exertional chest discomfort nor dyspnea. Longest  episode about 30 minutes more often episodes are short. Occur at rest or with activity. Feels similar to previous SVT. No caffeine intake. Does note poor appetite and has been trying to ensure still eating regular meals. BP at home has been 140-150s. Never <130/80. Endorses taking medications as prescribed.   ROS: Please see the history of present illness.    All other systems reviewed and are negative.   Studies Reviewed: .        Cardiac Studies & Procedures   CARDIAC CATHETERIZATION  CARDIAC CATHETERIZATION 10/25/2020  Narrative   Dist LAD lesion is 80% stenosed.   LPAV lesion is 30% stenosed with 50% stenosed side branch in 1st LPL.   There is hyperdynamic left ventricular systolic function.  The left ventricular ejection fraction is greater than 65% by visual estimate.   Hemodynamic findings consistent with mild (secondary) pulmonary hypertension - LVEDP ~25 mmHg, PCWP ~19-20 mmHg   Normal Cardiac Output / Index (7.91 / 3.94)  POST-OPERATIVE DIAGNOSIS:  Stable single-vessel CAD with apical LAD 80% stenosis-no change from previous cath.  Otherwise very tortuous coronary arteries with no significant stenoses.  Mild (likely secondary) Pulmonary Hypertension with PA mean pressure of 28 mmHg (PAP 39/15 mmHg), and PCWP of 19 mmHg with LVEDP of 25 mmHg.  Significant systolic hypertension with initial pressures in the 200s over 110s, however after medication 152/78 mmHg with MAP of 109 mmHg; LVP/EDP 155/13 mmHg - 25 mmHg.  Ao sat 99%, PA sat 81%.  Fick Cardiac Output-Index: 7.91-3.84 (normal)  Suspect angina is due to accelerated hypertension, increased wall stress and microvascular ischemia.  Distal LAD lesion is not favorable for PCI (however if pain is intractable, could be considered)  Findings Coronary Findings Diagnostic  Dominance: Right  Left Main Vessel is large.  Left Anterior Descending Vessel is large. Dist LAD lesion is 80% stenosed. The lesion is focal and  eccentric. stable  First Diagonal Branch Vessel is small in size.  Second Diagonal Branch Vessel is small in size.  Third Diagonal Branch Vessel is small in size.  Ramus Intermedius Vessel is large.  Left Circumflex Vessel is large.  First Obtuse Marginal Branch Vessel is small in size.  First Left Posterolateral Branch Vessel is small in size.  Left Posterior Atrioventricular Artery Vessel is small in size. LPAV lesion is 30% stenosed with 50% stenosed side branch in 1st LPL.  Right Coronary Artery Vessel is large.  Acute Marginal Branch Vessel is small in size.  Right Ventricular Branch Vessel is small in size.  Second Right Posterolateral Branch Vessel is small in size.  Intervention  No interventions have been documented.   CARDIAC CATHETERIZATION  CARDIAC CATHETERIZATION 05/04/2019  Narrative  Dist LAD-1 lesion is 30% stenosed.  Dist LAD-2 lesion is 80% stenosed.  Dist Cx lesion is 60% stenosed.  Findings:  Ao = 173/104 (134) LV =  181/25 RA =  10 RV =  41/13 PA =  42/10 (26) PCW = 18 Fick cardiac output/index = 5.8/2.9 PVR = 1.4 wu Ao sat = 98% PA sat = 73%, 73%  Assessment: 1. 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 LVEF 55-60% 3. Mildly elevated filling pressures with mild  pulmonary venous HTN 4. Widely patent bilateral renal arteries. No evidence of AAA 5. Severe systemic HTN  Plan/Discussion:  Medical therapy. Distal LAD lesion is likely high-grade but very distal.  Focus on RF management.  Arvilla Meres, MD 4:49 PM  Findings Coronary Findings Diagnostic  Dominance: Right  Left Main Vessel is angiographically normal.  Left Anterior Descending Dist LAD-1 lesion is 30% stenosed. Dist LAD-2 lesion is 80% stenosed.  Left Circumflex Dist Cx lesion is 60% stenosed.  Intervention  No interventions have been documented.   STRESS TESTS  MYOCARDIAL PERFUSION IMAGING 04/20/2018  Narrative  The left ventricular  ejection fraction is moderately decreased (30-44%).  Nuclear stress EF: 41%.  There was no ST segment deviation noted during stress.  This is an intermediate risk study.  Abnormal, intermediate risk stress nuclear study with no ischemia or infarction; EF 41 with global hypokinesis and mild LVE; study intermediate risk due to reduced LV function.   ECHOCARDIOGRAM  ECHOCARDIOGRAM COMPLETE 04/05/2021  Narrative ECHOCARDIOGRAM REPORT    Patient Name:   Erica [name] Date of Exam: 04/05/2021 Medical Rec #:  [id number]      Height:       65.0 in Accession #:    [id number]     Weight:       216.8 lb Date of Birth:  January 13, 1976      BSA:          2.047 m Patient Age:    45 years       BP:           198/94 mmHg Patient Gender: F              HR:           71 bpm. Exam Location:  Outpatient  Procedure: 2D Echo, Cardiac Doppler, Color Doppler and Strain Analysis  Indications:    R06.02 SOB; R60.0 Lower extremity edema; Z51.11 Encounter for antineoplastic chemotheraphy  History:        Patient has prior history of Echocardiogram examinations, most recent 03/12/2018. CHF, CAD, Signs/Symptoms:Shortness of Breath and Edema; Risk Factors:Hypertension, Sleep Apnea, Dyslipidemia and Non-Smoker. Patient denies chest pain but does have ongoing SOB with bilateral leg edema. Patient undergoing oral chemotherapy for left breast cancer.  Sonographer:    Carlos American RVT, RDCS (AE), RDMS Referring Phys: [phone] [facility] [place]   Sonographer Comments: Patient is morbidly obese. Image acquisition challenging due to patient body habitus. IMPRESSIONS   1. Left ventricular ejection fraction, by estimation, is 55 to 60%. The left ventricle has normal function. The left ventricle has no regional wall motion abnormalities. There is mild left ventricular hypertrophy. Left ventricular diastolic parameters were normal. The average left ventricular global longitudinal strain is -15.0 %. The global  longitudinal strain is abnormal. 2. Right ventricular systolic function is normal. The right ventricular size is mildly enlarged. There is mildly elevated pulmonary artery systolic pressure. The estimated right ventricular systolic pressure is 40.2 mmHg. 3. Left atrial size was moderately dilated. 4. Right atrial size was moderately dilated. 5. The mitral valve is normal in structure. Mild mitral valve regurgitation. No evidence of mitral stenosis. 6. The aortic valve is normal in structure. Aortic valve regurgitation is not visualized. No aortic stenosis is present. 7. The inferior vena cava is normal in size with greater than 50% respiratory variability, suggesting right atrial pressure of 3 mmHg.  Comparison(s): EF 45%, moderate LVH, GLS -13.1%.  FINDINGS Left Ventricle: Left ventricular ejection fraction, by estimation, is 55  to 60%. The left ventricle has normal function. The left ventricle has no regional wall motion abnormalities. The average left ventricular global longitudinal strain is -15.0 %. The global longitudinal strain is abnormal. 3D left ventricular ejection fraction analysis performed but not reported based on interpreter judgement due to suboptimal tracking. The left ventricular internal cavity size was normal in size. There is mild left ventricular hypertrophy. Left ventricular diastolic parameters were normal.  Right Ventricle: The right ventricular size is mildly enlarged. No increase in right ventricular wall thickness. Right ventricular systolic function is normal. There is mildly elevated pulmonary artery systolic pressure. The tricuspid regurgitant velocity is 3.05 m/s, and with an assumed right atrial pressure of 3 mmHg, the estimated right ventricular systolic pressure is 40.2 mmHg.  Left Atrium: Left atrial size was moderately dilated.  Right Atrium: Right atrial size was moderately dilated.  Pericardium: There is no evidence of pericardial effusion.  Mitral  Valve: The mitral valve is normal in structure. Mild mitral valve regurgitation, with posteriorly-directed jet. No evidence of mitral valve stenosis.  Tricuspid Valve: The tricuspid valve is normal in structure. Tricuspid valve regurgitation is mild . No evidence of tricuspid stenosis.  Aortic Valve: The aortic valve is normal in structure. Aortic valve regurgitation is not visualized. No aortic stenosis is present. Aortic valve mean gradient measures 4.0 mmHg. Aortic valve peak gradient measures 8.4 mmHg. Aortic valve area, by VTI measures 1.67 cm.  Pulmonic Valve: The pulmonic valve was normal in structure. Pulmonic valve regurgitation is mild. No evidence of pulmonic stenosis.  Aorta: The aortic root is normal in size and structure.  Venous: The inferior vena cava is normal in size with greater than 50% respiratory variability, suggesting right atrial pressure of 3 mmHg.  IAS/Shunts: No atrial level shunt detected by color flow Doppler.   LEFT VENTRICLE PLAX 2D LVIDd:         4.78 cm     Diastology LVIDs:         3.08 cm     LV e' medial:    7.83 cm/s LV PW:         1.31 cm     LV E/e' medial:  14.3 LV IVS:        1.18 cm     LV e' lateral:   7.07 cm/s LVOT diam:     1.90 cm     LV E/e' lateral: 15.8 LV SV:         50 LV SV Index:   25          2D Longitudinal Strain LVOT Area:     2.84 cm    2D Strain GLS Avg:     -15.0 %  LV Volumes (MOD) LV vol d, MOD A2C: 76.5 ml 3D Volume EF: LV vol d, MOD A4C: 88.9 ml 3D EF:        49 % LV vol s, MOD A2C: 36.4 ml LV EDV:       149 ml LV vol s, MOD A4C: 39.7 ml LV ESV:       76 ml LV SV MOD A2C:     40.1 ml LV SV:        73 ml LV SV MOD A4C:     88.9 ml LV SV MOD BP:      45.0 ml  RIGHT VENTRICLE RV S prime:     13.60 cm/s TAPSE (M-mode): 2.6 cm  LEFT ATRIUM  Index        RIGHT ATRIUM           Index LA diam:        4.50 cm  2.20 cm/m   RA Area:     20.80 cm LA Vol (A2C):   87.8 ml  42.89 ml/m  RA Volume:   67.70 ml   33.07 ml/m LA Vol (A4C):   126.0 ml 61.55 ml/m LA Biplane Vol: 105.0 ml 51.29 ml/m AORTIC VALVE                     PULMONIC VALVE AV Area (Vmax):    1.70 cm      PV Vmax:          1.06 m/s AV Area (Vmean):   1.68 cm      PV Peak grad:     4.5 mmHg AV Area (VTI):     1.67 cm      PR End Diast Vel: 9.49 msec AV Vmax:           145.00 cm/s AV Vmean:          100.000 cm/s AV VTI:            0.300 m AV Peak Grad:      8.4 mmHg AV Mean Grad:      4.0 mmHg LVOT Vmax:         87.00 cm/s LVOT Vmean:        59.300 cm/s LVOT VTI:          0.177 m LVOT/AV VTI ratio: 0.59  AORTA Ao Root diam: 3.30 cm Ao Asc diam:  3.10 cm  MITRAL VALVE                TRICUSPID VALVE MV Area (PHT): 4.10 cm     TR Peak grad:   37.2 mmHg MV Decel Time: 185 msec     TR Vmax:        305.00 cm/s MV E velocity: 112.00 cm/s MV A velocity: 57.10 cm/s   SHUNTS MV E/A ratio:  1.96         Systemic VTI:  0.18 m Systemic Diam: 1.90 cm  Donato Schultz MD Electronically signed by Donato Schultz MD Signature Date/Time: 04/05/2021/3:22:14 PM    Final    MONITORS  LONG TERM MONITOR (3-14 DAYS) 04/10/2020  Narrative 3 Day Zio Monitor  Quality: Fair.  Baseline artifact. Predominant rhythm: sinus Average heart rate: 84 bpm Max heart rate: 140 bpm Min heart rate: 57 bpm Pauses >2.5 seconds: none Occasional PVCs and ventricular bigeminy Two runs of SVT up to 8 beats.  Rate 224 bpm.   Tiffany C. Duke Salvia, MD, [facility] 04/10/2020 8:10 AM           Risk Assessment/Calculations:     HYPERTENSION CONTROL Vitals:   10/02/22 0848 10/02/22 1137  BP: (!) 162/92 (!) 146/80    The patient's blood pressure is elevated above target today.  In order to address the patient's elevated BP: A current anti-hypertensive medication was adjusted today.; Follow up with general cardiology has been recommended. (Referring for renal denervation)          Physical Exam:   VS:  BP (!) 146/80 Comment: Home BP  Pulse 94   Ht 5'  5" (1.651 m)   Wt 212 lb (96.2 kg) Comment: 10/01/22  LMP  (LMP Unknown)   BMI 35.28 kg/m    Wt Readings from Last 3 Encounters:  10/02/22 212 lb (96.2 kg)  10/01/22 212 lb 9.6 oz (96.4 kg)  09/21/22 220 lb (99.8 kg)    GEN: Well nourished, overweight well developed in no acute distress NECK: No JVD; No carotid bruits CARDIAC: RRR, no murmurs, rubs, gallops RESPIRATORY:  Clear to auscultation without rales, wheezing or rhonchi  ABDOMEN: Soft, non-tender, non-distended EXTREMITIES:  No edema; No deformity   ASSESSMENT AND PLAN: .    Malignant hypertension - Clinic BP 162/92 with home SBP 140-150. Never at goal <130/80. BP not controlled. . Previously intolerance to Imdur. Present prescribed antihypertensive regimen includes Clonidine 0.6mg  patch, Amlodipine 10mg  QD, Doxazosin 16mg  QHS, Hydralazine 50mg  TID, Chlorthalidone 25mg  QD, Torsemide 40mg  BID, Nebivolol 40mg  QD, Ranexa 1000mg  BID, Spironolactone 100mg  every day. Increase Diltiazem from 240mg  to 300mg  every day due to palpitations. Low suspicion this will make significant impact on BP.   Will refer for renal denervation. Discussed risks and benefits of the planned procedure and she is agreeable to proceed. She understands the <1% risk of serious complications.  Secondary workup detailed below.   Renal artery stenosis - Renal duplex 09/2018 bilateral 1-59% stenosis with CT angio abdomen 09/22/19 with no RAS. Plan for renal duplex to reassess prior to RDN.    Chronic stable angina / CAD - Intolerant of Imdur due to headache.She is on maximum dose Ranexa. GDMT includes Plavix (no Aspirin due to allergy), Nebivolol, Rosuvastatin, Zetia. R/LHC 09/2020 with stable single vessel CAD with apical 80% stenosis unchanged with torturous coronary arteries. Distal LAD lesion not favorable for PCI. No symptoms concerning for worsening angina.   Hypertriglyceridemia / HLD, LDL goal <70 - Continue Repatha, Zetia, Rosuvastatin 40mg  QD.  Update lipid panel,  CMP.  If LDL at goal <70 consider discontinuing Zetia for medication regimen simplification.    OSA - BIPAP compliance encouraged. Reports wearing every evening with her oxygen. She follows with Dr. Mayford Knife in regards to her OSA.    Chronic diastolic heart failure - Euvolemic on exam with no lower extremity edema.  Continue current dose of torsemide, metolazone, Entresto, nebivolol.  Stable renal function despite multiple diuretics.CMP, magnesium today due to cramping with Metolazone.    Asthma - Follows with pulmonology Dr. Sherene Sires.  No signs of acute exacerbation   SVT - Due to worsening palpitations, increase Diltiazem from 240mg  to 300mg  every day. CMP, mag, thyroid panel, CBC today to rule out electrolyte abnormality, thyroid abnormality, or anemia as contributory.    Breast cancer - 08/2020 mammogram no evidence of malignancy. On Goserelin every 4 weeks until age 43 per oncology team.   BMI 35 - Weight loss via diet and exercise encouraged. Discussed the impact being overweight would have on cardiovascular risk. Update A1c to r/o diabetes.  Screening for Secondary Hypertension:      07/16/2021   10:34 AM 10/02/2022   11:45 AM  Causes  Drugs/Herbals Screened Screened  Renovascular HTN Screened Screened     - Comments Negative on CT and renal Dopplers 09/2018 bilteral 1-59% stenosis. 08/2019 unermarable abdominal CTA with no RAS. 09/2022 updated renal duplex ordered as >3 years since last study  Sleep Apnea Screened Screened     - Comments Continue BiPAP Wearing BIPAP regularly  Thyroid Disease Screened Screened     - Comments  09/2022 updated thyroid panel performed  Hyperaldosteronism Screened   Pheochromocytoma N/A Screened     - Comments  CT arota 08/2019 with normal adrenal gland  Cushing's Syndrome N/A N/A  Hyperparathyroidism Screened   Coarctation  of the Aorta Screened Screened     - Comments BP symmetric BP symmetric  Compliance Screened Screened           Dispo: follow up in  2 months with Dr. Duke Salvia or APP  Signed, Alver Sorrow, NP

## 2022-10-03 LAB — COMPREHENSIVE METABOLIC PANEL
ALT: 29 IU/L (ref 0–32)
AST: 21 IU/L (ref 0–40)
Albumin: 4 g/dL (ref 3.9–4.9)
Alkaline Phosphatase: 87 IU/L (ref 44–121)
BUN/Creatinine Ratio: 9 (ref 9–23)
BUN: 9 mg/dL (ref 6–24)
Bilirubin Total: <0.2 mg/dL (ref 0.0–1.2)
CO2: 24 mmol/L (ref 20–29)
Calcium: 9 mg/dL (ref 8.7–10.2)
Chloride: 105 mmol/L (ref 96–106)
Creatinine, Ser: 0.98 mg/dL (ref 0.57–1.00)
Globulin, Total: 3.3 g/dL (ref 1.5–4.5)
Glucose: 100 mg/dL — ABNORMAL HIGH (ref 70–99)
Potassium: 3.8 mmol/L (ref 3.5–5.2)
Sodium: 142 mmol/L (ref 134–144)
Total Protein: 7.3 g/dL (ref 6.0–8.5)
eGFR: 71 mL/min/{1.73_m2} (ref >59)

## 2022-10-03 LAB — LIPID PANEL
Chol/HDL Ratio: 2 ratio (ref 0.0–4.4)
Cholesterol, Total: 90 mg/dL — ABNORMAL LOW (ref 100–199)
HDL: 46 mg/dL (ref >39)
LDL Chol Calc (NIH): 26 mg/dL (ref 0–99)
Triglycerides: 92 mg/dL (ref 0–149)
VLDL Cholesterol Cal: 18 mg/dL (ref 5–40)

## 2022-10-03 LAB — MAGNESIUM: Magnesium: 2.3 mg/dL (ref 1.6–2.3)

## 2022-10-03 LAB — CBC
Hematocrit: 41.7 % (ref 34.0–46.6)
Hemoglobin: 13.9 g/dL (ref 11.1–15.9)
MCH: 30.2 pg (ref 26.6–33.0)
MCHC: 33.3 g/dL (ref 31.5–35.7)
MCV: 91 fL (ref 79–97)
Platelets: 210 10*3/uL (ref 150–450)
RBC: 4.61 x10E6/uL (ref 3.77–5.28)
RDW: 13.1 % (ref 11.7–15.4)
WBC: 6.8 10*3/uL (ref 3.4–10.8)

## 2022-10-03 LAB — HEMOGLOBIN A1C
Est. average glucose Bld gHb Est-mCnc: 128 mg/dL
Hgb A1c MFr Bld: 6.1 % — ABNORMAL HIGH (ref 4.8–5.6)

## 2022-10-03 LAB — THYROID PANEL WITH TSH
Free Thyroxine Index: 2.6 (ref 1.2–4.9)
T3 Uptake Ratio: 29 % (ref 24–39)
T4, Total: 9 ug/dL (ref 4.5–12.0)
TSH: 1.79 u[IU]/mL (ref 0.450–4.500)

## 2022-10-10 ENCOUNTER — Ambulatory Visit (HOSPITAL_COMMUNITY)
Admission: RE | Admit: 2022-10-10 | Discharge: 2022-10-10 | Disposition: A | Discharge status: Home / Self Care | Payer: 59 | Source: Ambulatory Visit | Attending: Internal Medicine | Admitting: Internal Medicine

## 2022-10-10 ENCOUNTER — Inpatient Hospital Stay: Payer: 59

## 2022-10-10 DIAGNOSIS — I1A0 Resistant hypertension: Secondary | ICD-10-CM

## 2022-10-10 DIAGNOSIS — I10 Essential (primary) hypertension: Secondary | ICD-10-CM

## 2022-10-10 DIAGNOSIS — I701 Atherosclerosis of renal artery: Secondary | ICD-10-CM | POA: Diagnosis not present

## 2022-10-14 ENCOUNTER — Telehealth (HOSPITAL_BASED_OUTPATIENT_CLINIC_OR_DEPARTMENT_OTHER): Payer: Self-pay

## 2022-10-14 NOTE — Telephone Encounter (Addendum)
Called patient and reviewed the following lab results and updated her on RDN certification start. She states if possible she would like done before school goes back in.   Alver Sorrow, NP  Marlene Lard, RN Left kidney artery with 1-59% stenosis. No right renal artery stenosis. Overall a good result - no significant stenosis that would cause elevated BP.  ----- Message from Alver Sorrow sent at 10/14/2022 11:48 AM EDT ----- Regarding: RE: RDN and RAS Thanks, Dr. Kirke Corin!  Marchelle Folks- will you please start the pre-certification process for RDN, please? Rutherford Guys - will you call/message her to let her know we are starting the process? We discussed it at her last OV. Misty Stanley - just FYI :) ----- Message ----- From: Iran Ouch, MD Sent: 10/14/2022  10:53 AM EDT To: Alver Sorrow, NP Subject: RE: RDN and RAS                                Hi Caitlin, I reviewed her imaging studies and I do not see significant renal artery stenosis that would interfere with RDN. She should be a good candidate.  Thanks. ----- Message ----- From: Alver Sorrow, NP Sent: 10/11/2022   3:08 PM EDT To: Iran Ouch, MD Subject: RDN and RAS                                    Hi Dr. Kirke Corin,   I spoke with Miss Woldt about RDN but her RAS history is a bit convoluted so I wanted your input if possible. 09/2018 Renal duplex bilateral 1-59% stenosis. 09/22/19 CT angio abdomen with no RAS. I updated renal duplex this week with left renal artery 1-59% stenosis.   What are your thoughts on RDN for? I wasn't sure whether we could still proceed since the stenosis is unilateral.  Thanks so much,  Alver Sorrow, NP

## 2022-10-15 ENCOUNTER — Inpatient Hospital Stay: Payer: 59 | Attending: Adult Health

## 2022-10-15 ENCOUNTER — Other Ambulatory Visit: Payer: Self-pay

## 2022-10-15 VITALS — BP 180/91 | HR 78 | Resp 18

## 2022-10-15 DIAGNOSIS — C50412 Malignant neoplasm of upper-outer quadrant of left female breast: Secondary | ICD-10-CM | POA: Insufficient documentation

## 2022-10-15 DIAGNOSIS — Z79818 Long term (current) use of other agents affecting estrogen receptors and estrogen levels: Secondary | ICD-10-CM | POA: Diagnosis present

## 2022-10-15 MED ORDER — GOSERELIN ACETATE 3.6 MG ~~LOC~~ IMPL
3.6000 mg | DRUG_IMPLANT | SUBCUTANEOUS | Status: DC
  Administered 2022-10-15: 3.6 mg via SUBCUTANEOUS
  Filled 2022-10-15: qty 3.6

## 2022-10-22 ENCOUNTER — Other Ambulatory Visit: Payer: Self-pay

## 2022-10-22 MED ORDER — RANOLAZINE ER 1000 MG PO TB12: 1000.0000 mg | ORAL_TABLET | Freq: Two times a day (BID) | ORAL | 3 refills | Status: AC

## 2022-10-24 ENCOUNTER — Telehealth: Payer: Self-pay | Admitting: *Deleted

## 2022-10-24 ENCOUNTER — Telehealth (HOSPITAL_BASED_OUTPATIENT_CLINIC_OR_DEPARTMENT_OTHER): Payer: Self-pay

## 2022-10-24 DIAGNOSIS — I1A0 Resistant hypertension: Secondary | ICD-10-CM

## 2022-10-24 NOTE — Telephone Encounter (Signed)
The patient has agreed to move forward with the procedure. Instructions have been provided and will also be sent to MyChart.

## 2022-10-24 NOTE — Telephone Encounter (Addendum)
Called patient, she is scheduled!   ----- Message from Alver Sorrow sent at 10/24/2022 12:53 PM EDT ----- Regarding: RE: RDN and RAS Thanks, Misty Stanley!   Rutherford Guys - can we get her set up for HTN visit 1-3 weeks prior to RDN? Okay in person or virtual.

## 2022-10-24 NOTE — Telephone Encounter (Signed)
Left a message for the patient to call back to schedule the Renal Denervation Procedure.

## 2022-10-25 ENCOUNTER — Encounter: Payer: Self-pay | Admitting: Oncology

## 2022-10-25 ENCOUNTER — Encounter: Payer: Self-pay | Admitting: Adult Health

## 2022-10-25 ENCOUNTER — Emergency Department (HOSPITAL_BASED_OUTPATIENT_CLINIC_OR_DEPARTMENT_OTHER)
Admission: EM | Admit: 2022-10-25 | Discharge: 2022-10-25 | Disposition: A | Discharge status: Home / Self Care | Payer: 59 | Attending: Emergency Medicine | Admitting: Emergency Medicine

## 2022-10-25 ENCOUNTER — Other Ambulatory Visit: Payer: Self-pay

## 2022-10-25 ENCOUNTER — Telehealth: Payer: Self-pay | Admitting: *Deleted

## 2022-10-25 DIAGNOSIS — Z853 Personal history of malignant neoplasm of breast: Secondary | ICD-10-CM | POA: Diagnosis not present

## 2022-10-25 DIAGNOSIS — Z79899 Other long term (current) drug therapy: Secondary | ICD-10-CM | POA: Insufficient documentation

## 2022-10-25 DIAGNOSIS — I10 Essential (primary) hypertension: Secondary | ICD-10-CM | POA: Insufficient documentation

## 2022-10-25 DIAGNOSIS — N611 Abscess of the breast and nipple: Secondary | ICD-10-CM | POA: Insufficient documentation

## 2022-10-25 MED ORDER — CLINDAMYCIN HCL 150 MG PO CAPS: 450.0000 mg | ORAL_CAPSULE | Freq: Three times a day (TID) | ORAL | 0 refills | Status: AC

## 2022-10-25 NOTE — ED Notes (Signed)
 RN reviewed discharge instructions with pt. Pt verbalized understanding and had no further questions. VSS upon discharge.  

## 2022-10-25 NOTE — Telephone Encounter (Signed)
Her most recent breast nodule was consistent both clinically and radiographically with a sebaceous cyst.  I would recommend she see urgent care if it is causing her intense pain to see if it is consistent with a sebaceous cyst, or if it is a different concern.

## 2022-10-25 NOTE — ED Provider Notes (Signed)
[place] EMERGENCY DEPARTMENT AT [facility] Provider Note   CSN: [id number] Arrival date & time: 10/25/22  1458     History  Chief Complaint  Patient presents with   Cyst    Erica [name] is a 47 y.o. female with a PMHx of breast cancer, HTN, who presents to the ED with concerns for cyst to her right breast.  Notes history of similar symptoms.  Reached out to her oncologist who told her to come into the emergency department for further evaluation of her symptoms.  Has never had the area drained before.  No meds tried prior to arrival. Denies fever, drainage, redness, chest pain, shortness of breath.    Per pt chart review: Pt has been evaluated in her oncologist office on 06/25/22 for follow up for her breast cancer. Noted to have a sebaceous cyst and [place] and mammogram was ordered at that time. Mammogram and [place] noted No evidence of malignancy in either breast. There is a trace amount of fluid in the area of clinical concern in the right breast at 6 o'clock 2 cm from the nipple. This is likely from a resolving sebaceous cyst.   The history is provided by the patient. No language interpreter was used.       Home Medications Prior to Admission medications   Medication Sig Start Date End Date Taking? Authorizing Provider  clindamycin (CLEOCIN) 150 MG capsule Take 3 capsules (450 mg total) by mouth 3 (three) times daily for 5 days. 10/25/22 10/30/22 Yes Miho Monda A, PA-C  acetaZOLAMIDE (DIAMOX) 250 MG tablet Take 4 tablets (1,000 mg total) by mouth 3 (three) times daily. 07/19/19   Patel, Roxana Hires K, DO  albuterol (PROVENTIL) (2.5 MG/3ML) 0.083% nebulizer solution USE 1 VIAL VIA NEBULIZER EVERY 4 HOURS AND AS NEEDED FOR WHEEZING OR SHORTNESS OF BREATH 05/07/22   Cobb, Ruby Cola, NP  albuterol (VENTOLIN HFA) 108 (90 Base) MCG/ACT inhaler TAKE 2 PUFFS BY MOUTH EVERY 6 HOURS AS NEEDED FOR WHEEZE OR SHORTNESS OF BREATH 08/27/22   Cobb, Ruby Cola, NP  AMBULATORY NON FORMULARY  MEDICATION Carpal Tunnel Wrist brace. Rt  Carpal Tunnel Syndrome G56.01  Disp 1 08/03/20   Rodolph Bong, MD  AMBULATORY NON FORMULARY MEDICATION Lace up ankle brace (ASO type)  Ankle sprain S93.491A  Disp 1 08/03/20   Rodolph Bong, MD  amitriptyline (ELAVIL) 100 MG tablet TAKE 1 TABLET BY MOUTH EVERYDAY AT BEDTIME 12/28/18   [name], MD  amLODipine (NORVASC) 10 MG tablet TAKE 1 TABLET BY MOUTH EVERY DAY 12/10/21   Chilton Si, MD  azithromycin [facility]) 250 MG tablet Take 2 tablets on day #1; then 1 tablet daily x 4 days 03/19/22   Glenford Bayley, NP  Biotin 5000 MCG TABS Take 5,000 mg by mouth 2 (two) times daily.     [provider]  budesonide-formoterol (SYMBICORT) 160-4.5 MCG/ACT inhaler INHALE 2 PUFFS INTO LUNGS 2 TIMES DAILY AS DIRECTED 05/03/22   Nyoka Cowden, MD  chlorthalidone (HYGROTON) 25 MG tablet TAKE 1 TABLET (25 MG TOTAL) BY MOUTH DAILY. 06/27/22 06/22/23  Chilton Si, MD  Cholecalciferol (VITAMIN D) 50 MCG (2000 UT) tablet Take 2,000 Units by mouth 4 (four) times a week.    [provider]  cloNIDine (CATAPRES - DOSED IN MG/24 HR) 0.3 mg/24hr patch PLACE 2 PATCHES ONTO SKIN ONCE WEEKLY 06/10/22   Chilton Si, MD  clopidogrel (PLAVIX) 75 MG tablet TAKE 1 TABLET BY MOUTH EVERY DAY 05/07/22  Chilton Si, MD  clotrimazole (MYCELEX) 10 MG troche TAKE 1 TABLET BY MOUTH 5 TIMES DAILY. 07/30/22   Nyoka Cowden, MD  COVID-19 mRNA bivalent vaccine, Moderna, (MODERNA COVID-19 BIVAL BOOSTER) 50 MCG/0.5ML injection Inject into the muscle. 03/01/21   Judyann Munson, MD  dexlansoprazole (DEXILANT) 60 MG capsule Take 1 capsule (60 mg total) by mouth daily. Patient taking differently: Take 60 mg by mouth daily. Pt taking 90 MG 02/10/20   Pyrtle, Carie Caddy, MD  dextromethorphan-guaiFENesin [facility] DM) 30-600 MG 12hr tablet Take 2 tablets by mouth 2 (two) times daily.    [provider]  diltiazem (CARDIZEM CD) 300 MG 24 hr capsule Take 1  capsule (300 mg total) by mouth daily. 10/02/22   Alver Sorrow, NP  doxazosin (CARDURA) 8 MG tablet TAKE 2 TABLETS BY MOUTH EVERY DAY AT BEDTIME 06/27/22   Chilton Si, MD  DULoxetine (CYMBALTA) 30 MG capsule TAKE 1 CAPSULE (30 MG TOTAL) BY MOUTH DAILY. WITH 60MG  FOR TOTAL OF 90 MG Patient taking differently: Take 30 mg by mouth See admin instructions. With 60mg  for total of 90 mg 04/05/20   Nita Sickle K, DO  DULoxetine (CYMBALTA) 60 MG capsule TAKE 1 CAPSULE (60 MG TOTAL) BY MOUTH DAILY. FOR TOTAL OF 90 MG Patient taking differently: Take 60 mg by mouth See admin instructions. Take with 30 mg for a total of 90 mg in the morning 04/05/20   Nita Sickle K, DO  ENTRESTO 97-103 MG TAKE 1 TABLET BY MOUTH TWICE A DAY 06/10/22   Chilton Si, MD  EPINEPHRINE 0.3 mg/0.3 mL IJ SOAJ injection INJECT 0.3 MLS INTO THE MUSCLE ONCE AS NEEDED FOR ANAPHYLAXIS 12/10/16   [name], MD  Evolocumab (REPATHA SURECLICK) 140 MG/ML SOAJ Inject 1 mL into the skin every 14 (fourteen) days. 12/04/21   Chilton Si, MD  ezetimibe (ZETIA) 10 MG tablet TAKE 1 TABLET BY MOUTH EVERY DAY 05/07/22   Chilton Si, MD  famotidine (PEPCID) 20 MG tablet Take 1-2 tablets (20-40 mg total) by mouth at bedtime. Patient taking differently: Take 40 mg by mouth at bedtime. 02/10/20   Pyrtle, Carie Caddy, MD  gabapentin (NEURONTIN) 300 MG capsule Take 1 capsule (300 mg total) by mouth 3 (three) times daily. One three times daily Patient taking differently: Take 300 mg by mouth 3 (three) times daily. 11/23/19   Rodolph Bong, MD  hydrALAZINE (APRESOLINE) 50 MG tablet TAKE 1 TABLET BY MOUTH THREE TIMES A DAY 06/10/22   Chilton Si, MD  lidocaine-prilocaine (EMLA) cream Apply 1 application topically every 28 (twenty-eight) days. Apply 1 application to skin ever 28 days 02/11/17   [provider]  metoCLOPramide (REGLAN) 10 MG tablet TAKE 1 TABLET BY MOUTH 4 TIMES DAILY. Patient taking differently: Take 10 mg by  mouth 4 (four) times daily -  before meals and at bedtime. 08/31/20   Pyrtle, Carie Caddy, MD  metolazone (ZAROXOLYN) 2.5 MG tablet TAKE 1 TABLET BY MOUTH ONCE A WEEK. 02/18/22   Alver Sorrow, NP  minoxidil (LONITEN) 2.5 MG tablet TAKE 2 TABLETS (5 MG TOTAL) BY MOUTH 2 (TWO) TIMES DAILY 09/05/22   Chilton Si, MD  montelukast (SINGULAIR) 10 MG tablet TAKE 1 TABLET BY MOUTH EVERYDAY AT BEDTIME 03/26/22   Nyoka Cowden, MD  Nebivolol HCl 20 MG TABS TAKE 2 TABLETS BY MOUTH EVERY DAY 03/26/22   Chilton Si, MD  nitroGLYCERIN (NITROSTAT) 0.4 MG SL tablet PLACE 1 TABLET UNDER THE TONGUE EVERY 5 MINUTES AS  NEEDED FOR CHEST PAIN. 05/07/22   Chilton Si, MD  oxymetazoline [facility] SINUS-MAX FULL FORCE) 0.05 % nasal spray Place 1 spray into both nostrils 2 (two) times daily.    [provider]  Potassium Chloride ER 20 MEQ TBCR TAKE 2 TABLETS BY MOUTH TWICE A DAY 08/05/22   Chilton Si, MD  predniSONE (DELTASONE) 10 MG tablet 4 tabs for 2 days, then 3 tabs for 2 days, 2 tabs for 2 days, then 1 tab for 2 days, then stop 03/19/22   Glenford Bayley, NP  primidone (MYSOLINE) 50 MG tablet Take 0.5 tablets (25 mg total) by mouth at bedtime. 07/19/19   Patel, Donika K, DO  promethazine (PHENERGAN) 25 MG tablet TAKE 1 TABLET (25 MG TOTAL) BY MOUTH 3 (THREE) TIMES DAILY AS NEEDED FOR NAUSEA OR VOMITING. 09/11/20   Pyrtle, Carie Caddy, MD  promethazine-dextromethorphan (PROMETHAZINE-DM) 6.25-15 MG/5ML syrup TAKE 5 MLS BY MOUTH 4 TIMES A DAY AS NEEDED FOR COUGH 07/19/21   Nyoka Cowden, MD  ranolazine (RANEXA) 1000 MG SR tablet Take 1 tablet (1,000 mg total) by mouth 2 (two) times daily. 10/22/22   Chilton Si, MD  Respiratory Therapy Supplies (FLUTTER) DEVI Use as directed 03/24/15   Nyoka Cowden, MD  rosuvastatin (CRESTOR) 40 MG tablet Take 1 tablet (40 mg total) by mouth daily. 01/14/22   Chilton Si, MD  Spacer/Aero-Holding Chambers (AEROCHAMBER MV) inhaler Use as instructed 03/06/18    Nyoka Cowden, MD  spironolactone (ALDACTONE) 100 MG tablet TAKE 1 TABLET BY MOUTH EVERY DAY 05/07/22   Chilton Si, MD  Tdap Leda Min) 5-2.5-18.5 LF-MCG/0.5 injection Inject into the muscle. 12/06/21   Loa Socks, NP  torsemide (DEMADEX) 20 MG tablet TAKE 2 TABLETS BY MOUTH 2 TIMES DAILY. 03/26/22   Chilton Si, MD      Allergies    Aspirin, Doxycycline, Kiwi extract, Metoprolol, Penicillins, Strawberry extract, and Sulfamethoxazole-trimethoprim    Review of Systems   Review of Systems  All other systems reviewed and are negative.   Physical Exam Updated Vital Signs BP (!) 212/90 (BP Location: Right Arm)   Pulse 95   Temp 97.9 F (36.6 C)   Resp 16   LMP  (LMP Unknown)   SpO2 99%  Physical Exam Vitals and nursing note reviewed.  Constitutional:      General: She is not in acute distress.    Appearance: Normal appearance.  Eyes:     General: No scleral icterus.    Extraocular Movements: Extraocular movements intact.  Cardiovascular:     Rate and Rhythm: Normal rate.  Pulmonary:     Effort: Pulmonary effort is normal. No respiratory distress.  Chest:       Comments: RN chaperone present for breast exam. 1 cm area of fluctuance noted inferior to right areola. No surrounding erythema noted.  Abdominal:     Palpations: Abdomen is soft. There is no mass.     Tenderness: There is no abdominal tenderness.  Musculoskeletal:        General: Normal range of motion.     Cervical back: Neck supple.  Skin:    General: Skin is warm and dry.     Findings: No rash.  Neurological:     Mental Status: She is alert.     Sensory: Sensation is intact.     Motor: Motor function is intact.  Psychiatric:        Behavior: Behavior normal.     ED Results / Procedures / Treatments  Labs (all labs ordered are listed, but only abnormal results are displayed) Labs Reviewed - No data to display  EKG None  Radiology No results  found.  Procedures Procedures    Medications Ordered in ED Medications - No data to display  ED Course/ Medical Decision Making/ A&P Clinical Course as of 10/25/22 1821  Fri Oct 25, 2022  1736 Discussed with patient that we do not drain abscesses of the breast in the emergency department.  Discussed with patient discharge treatment plan consistent of follow-up with [facility] imaging.  Offered pain medication, patient Clines at this time.  Discussed with patient antibiotic therapy and warm compress therapy.  Patient agreeable at this time.  Patient appears safe for discharge at this time. [SB]    Clinical Course User Index [SB] Lidia Clavijo A, PA-C                             Medical Decision Making Risk Prescription drug management.   Pt presents with concerns for cyst to her right breast.  History of similar symptoms.  Denies history of diabetes.  Patient afebrile.  On exam patient with RN chaperone present for breast exam.  Approximately 1 cm area of fluctuance noted inferior to right areola.  No surrounding erythema noted.  Tenderness to palpation noted to the area.  Differential diagnosis includes cellulitis, abscess, cyst.   Co morbidities that complicate the patient evaluation: Breast cancer  Additional history obtained:  External records from outside source obtained and reviewed including: Pt has been evaluated in her oncologist office on 06/25/22 for follow up for her breast cancer. Noted to have a sebaceous cyst and [place] and mammogram was ordered at that time. Mammogram and [place] noted No evidence of malignancy in either breast. There is a trace amount of fluid in the area of clinical concern in the right breast at 6 o'clock 2 cm from the nipple. This is likely from a resolving sebaceous cyst.    Disposition: Presentation suspicious for abscess of breast.  Doubt concerns this time for cellulitis or cyst. After consideration of the diagnostic results and the patients response  to treatment, I feel that the patient would benefit from Discharge home.  Patient provided with information for [facility] imaging to set up a follow-up appointment regarding today's ED visit.  Also instructed to follow-up with her oncologist and primary care provider regarding today's ED visit.  Patient with extensive allergy list.  Sent with prescription for clindamycin.  Offered pain medication to go home with, patient declines at this time.  Supportive care measures and strict return precautions discussed with patient at bedside. Pt acknowledges and verbalizes understanding. Pt appears safe for discharge. Follow up as indicated in discharge paperwork.    This chart was dictated using voice recognition software, Dragon. Despite the best efforts of this provider to proofread and correct errors, errors may still occur which can change documentation meaning.   Final Clinical Impression(s) / ED Diagnoses Final diagnoses:  Breast abscess of female    Rx / DC Orders ED Discharge Orders          Ordered    clindamycin (CLEOCIN) 150 MG capsule  3 times daily        10/25/22 1740              Siddhant Hashemi A, PA-C 10/25/22 1821    Sloan Leiter, DO 10/27/22 0025

## 2022-10-25 NOTE — Discharge Instructions (Addendum)
It was a pleasure taking care of you today!  You have been given information for the [facility], call and set up an appointment for imaging of your breast. This can be managed by your Oncologist. You will also be sent a prescription for Clindamycin, take as directed and ensure to take the entire course of the antibiotic.  You may also take over-the-counter probiotics or consume Activia yogurt while taking this medication. You may take over-the-counter 600 mg ibuprofen every 6 hours and alternate with 500 mg Tylenol every 6 hours as needed for your symptoms for no more than 7 days.  You may place a warm compress to your skin up to 15-20 minutes at a time, sure to place a very between your skin and the warm compress.  aReturn to the ED if you are experiencing increasing/worsening symptoms.

## 2022-10-25 NOTE — ED Triage Notes (Signed)
Pt has a cysts right under right breast. Has hx of the same. Pt states that pain worsens.

## 2022-10-25 NOTE — Telephone Encounter (Signed)
This RN spoke with pt per her My Chart message regarding bump- and inquiry if she should proceed to the ER"  Per discussion pt states onset of "new bump came up on Wednesday" she states this is the 4th bump appearing over the past couple of months.  She states she has seen [facility]/NP in the past for above with concern of need to return to the surgeon's office for possible surgery.  " She told me they are cyst - and I was applying warm compresses but this one is not improving and really causing me a lot of pain."  This RN informed pt proceeding to the ER at this time would be best for her due to possible need for excision and drainage and possible antibiotic therapy- pt verbalized understanding.  Upon hanging  up and checking further noted pt has checked in the ER right before this call was made.  This note will be forwarded to provider for review of communication.

## 2022-10-28 ENCOUNTER — Telehealth: Payer: Self-pay | Admitting: *Deleted

## 2022-10-28 DIAGNOSIS — C50412 Malignant neoplasm of upper-outer quadrant of left female breast: Secondary | ICD-10-CM

## 2022-10-28 DIAGNOSIS — N6001 Solitary cyst of right breast: Secondary | ICD-10-CM

## 2022-10-28 NOTE — Telephone Encounter (Signed)
Please order ultrasound guided aspiration per Dr. Luisa Hart.  Patient may be more appropriate for incision and drainage, but will defer to radiologist performing the aspiration.    Thanks, LC

## 2022-10-28 NOTE — Telephone Encounter (Signed)
This RN spoke with pt per follow up from ER visit on Friday with noted d/c same day with antibiotic but no drainage.  She states she was told " they could not drain it- but gave me antibiotics and discharged me."  This RN informed her - this RN would follow up with her surgeon for possible I&D and or other recommendation.  Called to Dr Cornett's and post discussion was informed "Dr Luisa Hart reviewed her records in [facility] and recommends order for [place] of breast with aspiration"  This RN reviewed above with [facility]/NP- order entered and discussed with the patient.

## 2022-10-30 ENCOUNTER — Other Ambulatory Visit: Payer: Self-pay | Admitting: Hematology and Oncology

## 2022-10-30 DIAGNOSIS — N611 Abscess of the breast and nipple: Secondary | ICD-10-CM

## 2022-10-31 ENCOUNTER — Encounter (HOSPITAL_BASED_OUTPATIENT_CLINIC_OR_DEPARTMENT_OTHER): Payer: 59

## 2022-11-01 ENCOUNTER — Other Ambulatory Visit: Payer: Self-pay | Admitting: Cardiovascular Disease

## 2022-11-01 ENCOUNTER — Encounter: Payer: Self-pay | Admitting: Oncology

## 2022-11-01 DIAGNOSIS — Z006 Encounter for examination for normal comparison and control in clinical research program: Secondary | ICD-10-CM

## 2022-11-04 ENCOUNTER — Encounter: Payer: Self-pay | Admitting: Oncology

## 2022-11-06 ENCOUNTER — Encounter: Payer: Self-pay | Admitting: Oncology

## 2022-11-06 ENCOUNTER — Ambulatory Visit: Payer: 59

## 2022-11-06 ENCOUNTER — Ambulatory Visit
Admission: RE | Admit: 2022-11-06 | Discharge: 2022-11-06 | Disposition: A | Discharge status: Home / Self Care | Payer: 59 | Source: Ambulatory Visit | Attending: Hematology and Oncology | Admitting: Hematology and Oncology

## 2022-11-06 DIAGNOSIS — N611 Abscess of the breast and nipple: Secondary | ICD-10-CM

## 2022-11-07 ENCOUNTER — Inpatient Hospital Stay: Payer: 59

## 2022-11-11 ENCOUNTER — Encounter (HOSPITAL_BASED_OUTPATIENT_CLINIC_OR_DEPARTMENT_OTHER): Payer: Self-pay | Admitting: Family

## 2022-11-11 ENCOUNTER — Ambulatory Visit (HOSPITAL_BASED_OUTPATIENT_CLINIC_OR_DEPARTMENT_OTHER): Payer: 59 | Admitting: Family

## 2022-11-11 VITALS — BP 146/94 | HR 64 | Ht 65.0 in | Wt 217.0 lb

## 2022-11-11 DIAGNOSIS — E785 Hyperlipidemia, unspecified: Secondary | ICD-10-CM

## 2022-11-11 DIAGNOSIS — B379 Candidiasis, unspecified: Secondary | ICD-10-CM

## 2022-11-11 DIAGNOSIS — I10 Essential (primary) hypertension: Secondary | ICD-10-CM

## 2022-11-11 DIAGNOSIS — I471 Supraventricular tachycardia, unspecified: Secondary | ICD-10-CM

## 2022-11-11 DIAGNOSIS — G4733 Obstructive sleep apnea (adult) (pediatric): Secondary | ICD-10-CM

## 2022-11-11 DIAGNOSIS — I25118 Atherosclerotic heart disease of native coronary artery with other forms of angina pectoris: Secondary | ICD-10-CM

## 2022-11-11 DIAGNOSIS — I5032 Chronic diastolic (congestive) heart failure: Secondary | ICD-10-CM

## 2022-11-11 DIAGNOSIS — I2089 Other forms of angina pectoris: Secondary | ICD-10-CM

## 2022-11-11 MED ORDER — REPATHA SURECLICK 140 MG/ML ~~LOC~~ SOAJ
1.0000 mL | SUBCUTANEOUS | 3 refills | Status: DC
Start: 2022-11-11 — End: 2023-08-26

## 2022-11-11 MED ORDER — FLUCONAZOLE 150 MG PO TABS: 150.0000 mg | ORAL_TABLET | Freq: Every day | ORAL | 0 refills | Status: DC

## 2022-11-11 NOTE — H&P (View-Only) (Signed)
 Cardiology Office Note:  .   Date:  11/11/2022  ID:  [name], DOB 1975/05/13, MRN [id number] PCP: Patient, No Pcp Per  [facility] Providers Cardiologist:  Chilton Si, MD    History of Present Illness: .   ANALIS YELINEK is a 47 y.o. female with a hx of resistant hypertension, chronic diastolic heart failure, breast cancer s/p chemo and XRT, morbid obesity, OSA on BIPAP, renal artery stenosis, CAD, HLD.   She has history of hypertension dating back to treatment of breast cancer in 2010. She has established with Dr. Duke Salvia in the hypertension clinic. Previous echocardiogram 03/2018 LVEF 45-50%, grade 2 diastolic dysfunction, mild MR, PASP 33. Renal artery duppler 2019 with bilateral 1-59% stenosis. CT-A abdomen with 1.3 cm renal cyst but no adrenal adenomas. No evidence of renal artery stenosis.    She has a hx of recurrent syncope. 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 She has sleep apnea and uses BIPAP. She had LHC 05/2019 with finding of 60% distal LCx and 80% distal LAD disease. She was started on Clopidogrel due to allergy to aspirin. LE duplex 10/2019 for lower extremity edema was negative for DVT.    Blood pressure has been difficult to control. Doxazosin ordered at initial clinic visit. Amlodipine subsequently added. She has been given Metolazone in addition to Torsemide due to edema.  Her hydralazine was reduced to 50mg  TID and doxazosin increased to 8mg  twice daily to try to reduce TID dosing. Ranexa increased due to chest pain with improvement. Clonidine increased to 0.6mg  patch. Monitor 03/23/20  showing PVC with up to 8 beats of SVT.     Seen in follow up 10/23/20. She was scheduled for [facility] which was performed 10/25/20 with distal LAD 80% stenosed, LPAV lesion 30% stenosed with 50% stenosed side branch in first LPL. Hyperdynamic LV systolic function was noted. Hemodynamic findings were consistent with mild secondary  pulmonary hypertension. It was overall unchanged from previous. Angina was suspected due to accelerated hypertension, increased wall stress, and microvascular ischemia. Distal LAD not favorable for PCI but could be considered for intractable pain. BP markedly elevated 200s/110s which improved after medication to 152/78.   Echo 04/05/21 normal LVEF 55-60%, mild LVH, normal diastolic parameters, mildly elevated PASP, bilateral atria moderately dilated, mild MR. Based on follow up labs her Metolazone was reduced from 5mg  to 2.5mg  weekly.  Minoxidil was increased to 5 mg twice daily.   At follow up 05/2021 BP 150s/70s at home. She was not taking Doxazosin nor Minoxidil and these were resumed. At follow up 07/16/21 with Dr. Duke Salvia BP remained poorly controlled. HCTZ transitioned to Chlorthalidone. Doxasozin dose increased. Renal denervation unable to be done through research study as she was on too many BP medications. She wore a 24h BP monitor with average daytime BP 135/68 and during white coat period 182/96. Average overall  BP on monitor 127/65.    Seen 12/04/21. BP on 24h monitor markedly lower than her usual home readings calling into question accuracy of data. There was plan to repeat 24h BP with different machine but does not appear preformed. It was noted there were no additional antihypertensives to be added with plan to refer for renal denervation once FDA approved.   At visit 10/02/22 Diltiazem increased to 300mg  every day due to palpitations. Updated renal duplex with minimal stenosis. She was referred for renal denervation and scheduled for 11/20/22.   ED visit 10/25/22 for sebaceous  cyst of breast provided with PO clindamycin. Recent workup with oncology had revealed no recurrent malignancy.   Presents today for follow up independently. Palpitations improved with increased dose Diltiazem. BP at home has been 140-150s. Never <130/80. Endorses taking medications as prescribed. Notes some volume retention  with exertional dyspnea starting Thursday. Plans to take extra dose Metolazone today, agree with plan.   ROS: Please see the history of present illness.    All other systems reviewed and are negative.   Studies Reviewed: .        Cardiac Studies & Procedures   CARDIAC CATHETERIZATION  CARDIAC CATHETERIZATION 10/25/2020  Narrative   Dist LAD lesion is 80% stenosed.   LPAV lesion is 30% stenosed with 50% stenosed side branch in 1st LPL.   There is hyperdynamic left ventricular systolic function.  The left ventricular ejection fraction is greater than 65% by visual estimate.   Hemodynamic findings consistent with mild (secondary) pulmonary hypertension - LVEDP ~25 mmHg, PCWP ~19-20 mmHg   Normal Cardiac Output / Index (7.91 / 3.94)  POST-OPERATIVE DIAGNOSIS:  Stable single-vessel CAD with apical LAD 80% stenosis-no change from previous cath.  Otherwise very tortuous coronary arteries with no significant stenoses.  Mild (likely secondary) Pulmonary Hypertension with PA mean pressure of 28 mmHg (PAP 39/15 mmHg), and PCWP of 19 mmHg with LVEDP of 25 mmHg.  Significant systolic hypertension with initial pressures in the 200s over 110s, however after medication 152/78 mmHg with MAP of 109 mmHg; LVP/EDP 155/13 mmHg - 25 mmHg.  Ao sat 99%, PA sat 81%.  Fick Cardiac Output-Index: 7.91-3.84 (normal)  Suspect angina is due to accelerated hypertension, increased wall stress and microvascular ischemia.  Distal LAD lesion is not favorable for PCI (however if pain is intractable, could be considered)  Findings Coronary Findings Diagnostic  Dominance: Right  Left Main Vessel is large.  Left Anterior Descending Vessel is large. Dist LAD lesion is 80% stenosed. The lesion is focal and eccentric. stable  First Diagonal Branch Vessel is small in size.  Second Diagonal Branch Vessel is small in size.  Third Diagonal Branch Vessel is small in size.  Ramus Intermedius Vessel is  large.  Left Circumflex Vessel is large.  First Obtuse Marginal Branch Vessel is small in size.  First Left Posterolateral Branch Vessel is small in size.  Left Posterior Atrioventricular Artery Vessel is small in size. LPAV lesion is 30% stenosed with 50% stenosed side branch in 1st LPL.  Right Coronary Artery Vessel is large.  Acute Marginal Branch Vessel is small in size.  Right Ventricular Branch Vessel is small in size.  Second Right Posterolateral Branch Vessel is small in size.  Intervention  No interventions have been documented.   CARDIAC CATHETERIZATION  CARDIAC CATHETERIZATION 05/04/2019  Narrative  Dist LAD-1 lesion is 30% stenosed.  Dist LAD-2 lesion is 80% stenosed.  Dist Cx lesion is 60% stenosed.  Findings:  Ao = 173/104 (134) LV =  181/25 RA =  10 RV =  41/13 PA =  42/10 (26) PCW = 18 Fick cardiac output/index = 5.8/2.9 PVR = 1.4 wu Ao sat = 98% PA sat = 73%, 73%  Assessment: 1. 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 LVEF 55-60% 3. Mildly elevated filling pressures with mild pulmonary venous HTN 4. Widely patent bilateral renal arteries. No evidence of AAA 5. Severe systemic HTN  Plan/Discussion:  Medical therapy. Distal LAD lesion is likely high-grade but very distal.  Focus on RF management.  Arvilla Meres, MD  4:49 PM  Findings Coronary Findings Diagnostic  Dominance: Right  Left Main Vessel is angiographically normal.  Left Anterior Descending Dist LAD-1 lesion is 30% stenosed. Dist LAD-2 lesion is 80% stenosed.  Left Circumflex Dist Cx lesion is 60% stenosed.  Intervention  No interventions have been documented.   STRESS TESTS  MYOCARDIAL PERFUSION IMAGING 04/20/2018  Narrative  The left ventricular ejection fraction is moderately decreased (30-44%).  Nuclear stress EF: 41%.  There was no ST segment deviation noted during stress.  This is an intermediate risk study.  Abnormal, intermediate  risk stress nuclear study with no ischemia or infarction; EF 41 with global hypokinesis and mild LVE; study intermediate risk due to reduced LV function.   ECHOCARDIOGRAM  ECHOCARDIOGRAM COMPLETE 04/05/2021  Narrative ECHOCARDIOGRAM REPORT    Patient Name:   TINY BARTOS Date of Exam: 04/05/2021 Medical Rec #:  [id number]      Height:       65.0 in Accession #:    [id number]     Weight:       216.8 lb Date of Birth:  [birth year]      BSA:          2.047 m Patient Age:    45 years       BP:           198/94 mmHg Patient Gender: F              HR:           71 bpm. Exam Location:  Outpatient  Procedure: 2D Echo, Cardiac Doppler, Color Doppler and Strain Analysis  Indications:    R06.02 SOB; R60.0 Lower extremity edema; Z51.11 Encounter for antineoplastic chemotheraphy  History:        Patient has prior history of Echocardiogram examinations, most recent 03/12/2018. CHF, CAD, Signs/Symptoms:Shortness of Breath and Edema; Risk Factors:Hypertension, Sleep Apnea, Dyslipidemia and Non-Smoker. Patient denies chest pain but does have ongoing SOB with bilateral leg edema. Patient undergoing oral chemotherapy for left breast cancer.  Sonographer:    Carlos American RVT, RDCS (AE), RDMS Referring Phys: [phone] [facility] [employer]   Sonographer Comments: Patient is morbidly obese. Image acquisition challenging due to patient body habitus. IMPRESSIONS   1. Left ventricular ejection fraction, by estimation, is 55 to 60%. The left ventricle has normal function. The left ventricle has no regional wall motion abnormalities. There is mild left ventricular hypertrophy. Left ventricular diastolic parameters were normal. The average left ventricular global longitudinal strain is -15.0 %. The global longitudinal strain is abnormal. 2. Right ventricular systolic function is normal. The right ventricular size is mildly enlarged. There is mildly elevated pulmonary artery systolic pressure. The estimated right  ventricular systolic pressure is 40.2 mmHg. 3. Left atrial size was moderately dilated. 4. Right atrial size was moderately dilated. 5. The mitral valve is normal in structure. Mild mitral valve regurgitation. No evidence of mitral stenosis. 6. The aortic valve is normal in structure. Aortic valve regurgitation is not visualized. No aortic stenosis is present. 7. The inferior vena cava is normal in size with greater than 50% respiratory variability, suggesting right atrial pressure of 3 mmHg.  Comparison(s): EF 45%, moderate LVH, GLS -13.1%.  FINDINGS Left Ventricle: Left ventricular ejection fraction, by estimation, is 55 to 60%. The left ventricle has normal function. The left ventricle has no regional wall motion abnormalities. The average left ventricular global longitudinal strain is -15.0 %. The global longitudinal strain is abnormal. 3D left ventricular ejection fraction analysis performed  but not reported based on interpreter judgement due to suboptimal tracking. The left ventricular internal cavity size was normal in size. There is mild left ventricular hypertrophy. Left ventricular diastolic parameters were normal.  Right Ventricle: The right ventricular size is mildly enlarged. No increase in right ventricular wall thickness. Right ventricular systolic function is normal. There is mildly elevated pulmonary artery systolic pressure. The tricuspid regurgitant velocity is 3.05 m/s, and with an assumed right atrial pressure of 3 mmHg, the estimated right ventricular systolic pressure is 40.2 mmHg.  Left Atrium: Left atrial size was moderately dilated.  Right Atrium: Right atrial size was moderately dilated.  Pericardium: There is no evidence of pericardial effusion.  Mitral Valve: The mitral valve is normal in structure. Mild mitral valve regurgitation, with posteriorly-directed jet. No evidence of mitral valve stenosis.  Tricuspid Valve: The tricuspid valve is normal in structure.  Tricuspid valve regurgitation is mild . No evidence of tricuspid stenosis.  Aortic Valve: The aortic valve is normal in structure. Aortic valve regurgitation is not visualized. No aortic stenosis is present. Aortic valve mean gradient measures 4.0 mmHg. Aortic valve peak gradient measures 8.4 mmHg. Aortic valve area, by VTI measures 1.67 cm.  Pulmonic Valve: The pulmonic valve was normal in structure. Pulmonic valve regurgitation is mild. No evidence of pulmonic stenosis.  Aorta: The aortic root is normal in size and structure.  Venous: The inferior vena cava is normal in size with greater than 50% respiratory variability, suggesting right atrial pressure of 3 mmHg.  IAS/Shunts: No atrial level shunt detected by color flow Doppler.   LEFT VENTRICLE PLAX 2D LVIDd:         4.78 cm     Diastology LVIDs:         3.08 cm     LV e' medial:    7.83 cm/s LV PW:         1.31 cm     LV E/e' medial:  14.3 LV IVS:        1.18 cm     LV e' lateral:   7.07 cm/s LVOT diam:     1.90 cm     LV E/e' lateral: 15.8 LV SV:         50 LV SV Index:   25          2D Longitudinal Strain LVOT Area:     2.84 cm    2D Strain GLS Avg:     -15.0 %  LV Volumes (MOD) LV vol d, MOD A2C: 76.5 ml 3D Volume EF: LV vol d, MOD A4C: 88.9 ml 3D EF:        49 % LV vol s, MOD A2C: 36.4 ml LV EDV:       149 ml LV vol s, MOD A4C: 39.7 ml LV ESV:       76 ml LV SV MOD A2C:     40.1 ml LV SV:        73 ml LV SV MOD A4C:     88.9 ml LV SV MOD BP:      45.0 ml  RIGHT VENTRICLE RV S prime:     13.60 cm/s TAPSE (M-mode): 2.6 cm  LEFT ATRIUM              Index        RIGHT ATRIUM           Index LA diam:        4.50 cm  2.20 cm/m  RA Area:     20.80 cm LA Vol (A2C):   87.8 ml  42.89 ml/m  RA Volume:   67.70 ml  33.07 ml/m LA Vol (A4C):   126.0 ml 61.55 ml/m LA Biplane Vol: 105.0 ml 51.29 ml/m AORTIC VALVE                     PULMONIC VALVE AV Area (Vmax):    1.70 cm      PV Vmax:          1.06 m/s AV Area  (Vmean):   1.68 cm      PV Peak grad:     4.5 mmHg AV Area (VTI):     1.67 cm      PR End Diast Vel: 9.49 msec AV Vmax:           145.00 cm/s AV Vmean:          100.000 cm/s AV VTI:            0.300 m AV Peak Grad:      8.4 mmHg AV Mean Grad:      4.0 mmHg LVOT Vmax:         87.00 cm/s LVOT Vmean:        59.300 cm/s LVOT VTI:          0.177 m LVOT/AV VTI ratio: 0.59  AORTA Ao Root diam: 3.30 cm Ao Asc diam:  3.10 cm  MITRAL VALVE                TRICUSPID VALVE MV Area (PHT): 4.10 cm     TR Peak grad:   37.2 mmHg MV Decel Time: 185 msec     TR Vmax:        305.00 cm/s MV E velocity: 112.00 cm/s MV A velocity: 57.10 cm/s   SHUNTS MV E/A ratio:  1.96         Systemic VTI:  0.18 m Systemic Diam: 1.90 cm  Donato Schultz MD Electronically signed by Donato Schultz MD Signature Date/Time: 04/05/2021/3:22:14 PM    Final    MONITORS  LONG TERM MONITOR (3-14 DAYS) 03/23/2020  Narrative 3 Day Zio Monitor  Quality: Fair.  Baseline artifact. Predominant rhythm: sinus Average heart rate: 84 bpm Max heart rate: 140 bpm Min heart rate: 57 bpm Pauses >2.5 seconds: none Occasional PVCs and ventricular bigeminy Two runs of SVT up to 8 beats.  Rate 224 bpm.   Tiffany C. Duke Salvia, MD, [facility] 04/10/2020 8:10 AM           Risk Assessment/Calculations:            Physical Exam:   VS:  BP (!) 146/94   Pulse 64   Ht 5\' 5"  (1.651 m)   Wt 217 lb (98.4 kg) Comment: wearing boot  LMP  (LMP Unknown)   BMI 36.11 kg/m    Wt Readings from Last 3 Encounters:  11/11/22 217 lb (98.4 kg)  10/02/22 212 lb (96.2 kg)  10/01/22 212 lb 9.6 oz (96.4 kg)    GEN: Well nourished, overweight well developed in no acute distress NECK: No JVD; No carotid bruits CARDIAC: RRR, no murmurs, rubs, gallops RESPIRATORY:  Clear to auscultation without rales, wheezing or rhonchi  ABDOMEN: Soft, non-tender, non-distended EXTREMITIES:  No edema; No deformity   ASSESSMENT AND PLAN: .    Malignant  hypertension - BP not controlled despite compliance with 10 antihypertensive agents. Previously intolerant to Imdur. Present prescribed antihypertensive regimen  includes Clonidine 0.6mg  patch, Amlodipine 10mg  QD, Doxazosin 16mg  QHS, Diltiazem 300mg  daily, Hydralazine 50mg  TID, Chlorthalidone 25mg  QD, Torsemide 40mg  BID, Nebivolol 40mg  QD, Ranexa 1000mg  BID, Spironolactone 100mg  every day.  Secondary workup has been unremarkable.  Sleep apnea treated Scheduled for renal denervation 11/20/22. She will have CBC/BMP on Friday for pre-procedure labs.  Discussed risks and benefits of the planned procedure and she is agreeable to proceed. She understands the <1% risk of serious complications.  Post procedure, she prefers to reduce doses of TID agents (such as Hydralazine). Would avoid reducing her HF or anti-anginal agents. Could also consider reducing Minoxidil, Doxazosin in the future as BP allows post RDN. Secondary workup detailed below.   Renal artery stenosis - Renal duplex 09/2018 bilateral 1-59% stenosis with CT angio abdomen 09/22/19 with no RAS. Renal duplex 01/2023 L renal artery 1-59% stenosed, no right RAS.    Chronic stable angina / CAD - Intolerant of Imdur due to headache.She is on maximum dose Ranexa. GDMT includes Plavix (no Aspirin due to allergy), Nebivolol, Rosuvastatin, Zetia. R/LHC 09/2020 with stable single vessel CAD with apical 80% stenosis unchanged with torturous coronary arteries. Distal LAD lesion not favorable for PCI. No symptoms concerning for worsening angina.   Hypertriglyceridemia / HLD, LDL goal <70 - Continue Repatha, Zetia, Rosuvastatin 40mg  QD. 09/2022 LDL 26.    OSA - BIPAP compliance encouraged. Reports wearing every evening with her oxygen. She follows with Dr. Mayford Knife in regards to her OSA.    Chronic diastolic heart failure - Volume overload today. Extra dose Metolazone today one hour prior to Torsemide today.  Continue current dose of torsemide, metolazone, Entresto,  nebivolol.  Stable renal function despite multiple diuretics. Has BMP upcoming Friday.   Asthma - Follows with pulmonology Dr. Sherene Sires.  No signs of acute exacerbation   SVT - Continue Diltiazem 300mg  daily.  BMI 35 - Weight loss via diet and exercise encouraged. Discussed the impact being overweight would have on cardiovascular risk.10/02/22 A1c 6.1.  Screening for Secondary Hypertension:      07/16/2021   10:34 AM 10/02/2022   11:45 AM  Causes  Drugs/Herbals Screened Screened  Renovascular HTN Screened Screened     - Comments Negative on CT and renal Dopplers 09/2018 bilteral 1-59% stenosis. 08/2019 unermarable abdominal CTA with no RAS. 09/2022 updated renal duplex ordered as >3 years since last study  Sleep Apnea Screened Screened     - Comments Continue BiPAP Wearing BIPAP regularly  Thyroid Disease Screened Screened     - Comments  09/2022 updated thyroid panel performed  Hyperaldosteronism Screened   Pheochromocytoma N/A Screened     - Comments  CT arota 08/2019 with normal adrenal gland  Cushing's Syndrome N/A N/A  Hyperparathyroidism Screened   Coarctation of the Aorta Screened Screened     - Comments BP symmetric BP symmetric  Compliance Screened Screened           Dispo: follow up in 2 months with Dr. Duke Salvia or APP  Signed, Alver Sorrow, NP

## 2022-11-11 NOTE — Progress Notes (Signed)
Cardiology Office Note:  .   Date:  11/11/2022  ID:  Erica [name], DOB 1975/05/13, MRN [id number] PCP: Patient, No Pcp Per  [facility] Providers Cardiologist:  Chilton Si, MD    History of Present Illness: .   Erica [name] is a 47 y.o. female with a hx of resistant hypertension, chronic diastolic heart failure, breast cancer s/p chemo and XRT, morbid obesity, OSA on BIPAP, renal artery stenosis, CAD, HLD.   She has history of hypertension dating back to treatment of breast cancer in 2010. She has established with Dr. Duke Salvia in the hypertension clinic. Previous echocardiogram 03/2018 LVEF 45-50%, grade 2 diastolic dysfunction, mild MR, PASP 33. Renal artery duppler 2019 with bilateral 1-59% stenosis. CT-A abdomen with 1.3 cm renal cyst but no adrenal adenomas. No evidence of renal artery stenosis.    She has a hx of recurrent syncope. 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 She has sleep apnea and uses BIPAP. She had LHC 05/2019 with finding of 60% distal LCx and 80% distal LAD disease. She was started on Clopidogrel due to allergy to aspirin. LE duplex 10/2019 for lower extremity edema was negative for DVT.    Blood pressure has been difficult to control. Doxazosin ordered at initial clinic visit. Amlodipine subsequently added. She has been given Metolazone in addition to Torsemide due to edema.  Her hydralazine was reduced to 50mg  TID and doxazosin increased to 8mg  twice daily to try to reduce TID dosing. Ranexa increased due to chest pain with improvement. Clonidine increased to 0.6mg  patch. Monitor 03/23/20  showing PVC with up to 8 beats of SVT.     Seen in follow up 10/23/20. She was scheduled for [facility] which was performed 10/25/20 with distal LAD 80% stenosed, LPAV lesion 30% stenosed with 50% stenosed side branch in first LPL. Hyperdynamic LV systolic function was noted. Hemodynamic findings were consistent with mild secondary  pulmonary hypertension. It was overall unchanged from previous. Angina was suspected due to accelerated hypertension, increased wall stress, and microvascular ischemia. Distal LAD not favorable for PCI but could be considered for intractable pain. BP markedly elevated 200s/110s which improved after medication to 152/78.   Echo 04/05/21 normal LVEF 55-60%, mild LVH, normal diastolic parameters, mildly elevated PASP, bilateral atria moderately dilated, mild MR. Based on follow up labs her Metolazone was reduced from 5mg  to 2.5mg  weekly.  Minoxidil was increased to 5 mg twice daily.   At follow up 05/2021 BP 150s/70s at home. She was not taking Doxazosin nor Minoxidil and these were resumed. At follow up 07/16/21 with Dr. Duke Salvia BP remained poorly controlled. HCTZ transitioned to Chlorthalidone. Doxasozin dose increased. Renal denervation unable to be done through research study as she was on too many BP medications. She wore a 24h BP monitor with average daytime BP 135/68 and during white coat period 182/96. Average overall  BP on monitor 127/65.    Seen 12/04/21. BP on 24h monitor markedly lower than her usual home readings calling into question accuracy of data. There was plan to repeat 24h BP with different machine but does not appear preformed. It was noted there were no additional antihypertensives to be added with plan to refer for renal denervation once FDA approved.   At visit 10/02/22 Diltiazem increased to 300mg  every day due to palpitations. Updated renal duplex with minimal stenosis. She was referred for renal denervation and scheduled for 11/20/22.   ED visit 10/25/22 for sebaceous  cyst of breast provided with PO clindamycin. Recent workup with oncology had revealed no recurrent malignancy.   Presents today for follow up independently. Palpitations improved with increased dose Diltiazem. BP at home has been 140-150s. Never <130/80. Endorses taking medications as prescribed. Notes some volume retention  with exertional dyspnea starting Thursday. Plans to take extra dose Metolazone today, agree with plan.   ROS: Please see the history of present illness.    All other systems reviewed and are negative.   Studies Reviewed: .        Cardiac Studies & Procedures   CARDIAC CATHETERIZATION  CARDIAC CATHETERIZATION 10/25/2020  Narrative   Dist LAD lesion is 80% stenosed.   LPAV lesion is 30% stenosed with 50% stenosed side branch in 1st LPL.   There is hyperdynamic left ventricular systolic function.  The left ventricular ejection fraction is greater than 65% by visual estimate.   Hemodynamic findings consistent with mild (secondary) pulmonary hypertension - LVEDP ~25 mmHg, PCWP ~19-20 mmHg   Normal Cardiac Output / Index (7.91 / 3.94)  POST-OPERATIVE DIAGNOSIS:  Stable single-vessel CAD with apical LAD 80% stenosis-no change from previous cath.  Otherwise very tortuous coronary arteries with no significant stenoses.  Mild (likely secondary) Pulmonary Hypertension with PA mean pressure of 28 mmHg (PAP 39/15 mmHg), and PCWP of 19 mmHg with LVEDP of 25 mmHg.  Significant systolic hypertension with initial pressures in the 200s over 110s, however after medication 152/78 mmHg with MAP of 109 mmHg; LVP/EDP 155/13 mmHg - 25 mmHg.  Ao sat 99%, PA sat 81%.  Fick Cardiac Output-Index: 7.91-3.84 (normal)  Suspect angina is due to accelerated hypertension, increased wall stress and microvascular ischemia.  Distal LAD lesion is not favorable for PCI (however if pain is intractable, could be considered)  Findings Coronary Findings Diagnostic  Dominance: Right  Left Main Vessel is large.  Left Anterior Descending Vessel is large. Dist LAD lesion is 80% stenosed. The lesion is focal and eccentric. stable  First Diagonal Branch Vessel is small in size.  Second Diagonal Branch Vessel is small in size.  Third Diagonal Branch Vessel is small in size.  Ramus Intermedius Vessel is  large.  Left Circumflex Vessel is large.  First Obtuse Marginal Branch Vessel is small in size.  First Left Posterolateral Branch Vessel is small in size.  Left Posterior Atrioventricular Artery Vessel is small in size. LPAV lesion is 30% stenosed with 50% stenosed side branch in 1st LPL.  Right Coronary Artery Vessel is large.  Acute Marginal Branch Vessel is small in size.  Right Ventricular Branch Vessel is small in size.  Second Right Posterolateral Branch Vessel is small in size.  Intervention  No interventions have been documented.   CARDIAC CATHETERIZATION  CARDIAC CATHETERIZATION 05/04/2019  Narrative  Dist LAD-1 lesion is 30% stenosed.  Dist LAD-2 lesion is 80% stenosed.  Dist Cx lesion is 60% stenosed.  Findings:  Ao = 173/104 (134) LV =  181/25 RA =  10 RV =  41/13 PA =  42/10 (26) PCW = 18 Fick cardiac output/index = 5.8/2.9 PVR = 1.4 wu Ao sat = 98% PA sat = 73%, 73%  Assessment: 1. 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 LVEF 55-60% 3. Mildly elevated filling pressures with mild pulmonary venous HTN 4. Widely patent bilateral renal arteries. No evidence of AAA 5. Severe systemic HTN  Plan/Discussion:  Medical therapy. Distal LAD lesion is likely high-grade but very distal.  Focus on RF management.  Arvilla Meres, MD  4:49 PM  Findings Coronary Findings Diagnostic  Dominance: Right  Left Main Vessel is angiographically normal.  Left Anterior Descending Dist LAD-1 lesion is 30% stenosed. Dist LAD-2 lesion is 80% stenosed.  Left Circumflex Dist Cx lesion is 60% stenosed.  Intervention  No interventions have been documented.   STRESS TESTS  MYOCARDIAL PERFUSION IMAGING 04/20/2018  Narrative  The left ventricular ejection fraction is moderately decreased (30-44%).  Nuclear stress EF: 41%.  There was no ST segment deviation noted during stress.  This is an intermediate risk study.  Abnormal, intermediate  risk stress nuclear study with no ischemia or infarction; EF 41 with global hypokinesis and mild LVE; study intermediate risk due to reduced LV function.   ECHOCARDIOGRAM  ECHOCARDIOGRAM COMPLETE 04/05/2021  Narrative ECHOCARDIOGRAM REPORT    Patient Name:   TINY BARTOS Date of Exam: 04/05/2021 Medical Rec #:  [id number]      Height:       65.0 in Accession #:    [id number]     Weight:       216.8 lb Date of Birth:  [birth year]      BSA:          2.047 m Patient Age:    45 years       BP:           198/94 mmHg Patient Gender: F              HR:           71 bpm. Exam Location:  Outpatient  Procedure: 2D Echo, Cardiac Doppler, Color Doppler and Strain Analysis  Indications:    R06.02 SOB; R60.0 Lower extremity edema; Z51.11 Encounter for antineoplastic chemotheraphy  History:        Patient has prior history of Echocardiogram examinations, most recent 03/12/2018. CHF, CAD, Signs/Symptoms:Shortness of Breath and Edema; Risk Factors:Hypertension, Sleep Apnea, Dyslipidemia and Non-Smoker. Patient denies chest pain but does have ongoing SOB with bilateral leg edema. Patient undergoing oral chemotherapy for left breast cancer.  Sonographer:    Carlos American RVT, RDCS (AE), RDMS Referring Phys: [phone] [facility]   Sonographer Comments: Patient is morbidly obese. Image acquisition challenging due to patient body habitus. IMPRESSIONS   1. Left ventricular ejection fraction, by estimation, is 55 to 60%. The left ventricle has normal function. The left ventricle has no regional wall motion abnormalities. There is mild left ventricular hypertrophy. Left ventricular diastolic parameters were normal. The average left ventricular global longitudinal strain is -15.0 %. The global longitudinal strain is abnormal. 2. Right ventricular systolic function is normal. The right ventricular size is mildly enlarged. There is mildly elevated pulmonary artery systolic pressure. The estimated right  ventricular systolic pressure is 40.2 mmHg. 3. Left atrial size was moderately dilated. 4. Right atrial size was moderately dilated. 5. The mitral valve is normal in structure. Mild mitral valve regurgitation. No evidence of mitral stenosis. 6. The aortic valve is normal in structure. Aortic valve regurgitation is not visualized. No aortic stenosis is present. 7. The inferior vena cava is normal in size with greater than 50% respiratory variability, suggesting right atrial pressure of 3 mmHg.  Comparison(s): EF 45%, moderate LVH, GLS -13.1%.  FINDINGS Left Ventricle: Left ventricular ejection fraction, by estimation, is 55 to 60%. The left ventricle has normal function. The left ventricle has no regional wall motion abnormalities. The average left ventricular global longitudinal strain is -15.0 %. The global longitudinal strain is abnormal. 3D left ventricular ejection fraction analysis performed  but not reported based on interpreter judgement due to suboptimal tracking. The left ventricular internal cavity size was normal in size. There is mild left ventricular hypertrophy. Left ventricular diastolic parameters were normal.  Right Ventricle: The right ventricular size is mildly enlarged. No increase in right ventricular wall thickness. Right ventricular systolic function is normal. There is mildly elevated pulmonary artery systolic pressure. The tricuspid regurgitant velocity is 3.05 m/s, and with an assumed right atrial pressure of 3 mmHg, the estimated right ventricular systolic pressure is 40.2 mmHg.  Left Atrium: Left atrial size was moderately dilated.  Right Atrium: Right atrial size was moderately dilated.  Pericardium: There is no evidence of pericardial effusion.  Mitral Valve: The mitral valve is normal in structure. Mild mitral valve regurgitation, with posteriorly-directed jet. No evidence of mitral valve stenosis.  Tricuspid Valve: The tricuspid valve is normal in structure.  Tricuspid valve regurgitation is mild . No evidence of tricuspid stenosis.  Aortic Valve: The aortic valve is normal in structure. Aortic valve regurgitation is not visualized. No aortic stenosis is present. Aortic valve mean gradient measures 4.0 mmHg. Aortic valve peak gradient measures 8.4 mmHg. Aortic valve area, by VTI measures 1.67 cm.  Pulmonic Valve: The pulmonic valve was normal in structure. Pulmonic valve regurgitation is mild. No evidence of pulmonic stenosis.  Aorta: The aortic root is normal in size and structure.  Venous: The inferior vena cava is normal in size with greater than 50% respiratory variability, suggesting right atrial pressure of 3 mmHg.  IAS/Shunts: No atrial level shunt detected by color flow Doppler.   LEFT VENTRICLE PLAX 2D LVIDd:         4.78 cm     Diastology LVIDs:         3.08 cm     LV e' medial:    7.83 cm/s LV PW:         1.31 cm     LV E/e' medial:  14.3 LV IVS:        1.18 cm     LV e' lateral:   7.07 cm/s LVOT diam:     1.90 cm     LV E/e' lateral: 15.8 LV SV:         50 LV SV Index:   25          2D Longitudinal Strain LVOT Area:     2.84 cm    2D Strain GLS Avg:     -15.0 %  LV Volumes (MOD) LV vol d, MOD A2C: 76.5 ml 3D Volume EF: LV vol d, MOD A4C: 88.9 ml 3D EF:        49 % LV vol s, MOD A2C: 36.4 ml LV EDV:       149 ml LV vol s, MOD A4C: 39.7 ml LV ESV:       76 ml LV SV MOD A2C:     40.1 ml LV SV:        73 ml LV SV MOD A4C:     88.9 ml LV SV MOD BP:      45.0 ml  RIGHT VENTRICLE RV S prime:     13.60 cm/s TAPSE (M-mode): 2.6 cm  LEFT ATRIUM              Index        RIGHT ATRIUM           Index LA diam:        4.50 cm  2.20 cm/m  RA Area:     20.80 cm LA Vol (A2C):   87.8 ml  42.89 ml/m  RA Volume:   67.70 ml  33.07 ml/m LA Vol (A4C):   126.0 ml 61.55 ml/m LA Biplane Vol: 105.0 ml 51.29 ml/m AORTIC VALVE                     PULMONIC VALVE AV Area (Vmax):    1.70 cm      PV Vmax:          1.06 m/s AV Area  (Vmean):   1.68 cm      PV Peak grad:     4.5 mmHg AV Area (VTI):     1.67 cm      PR End Diast Vel: 9.49 msec AV Vmax:           145.00 cm/s AV Vmean:          100.000 cm/s AV VTI:            0.300 m AV Peak Grad:      8.4 mmHg AV Mean Grad:      4.0 mmHg LVOT Vmax:         87.00 cm/s LVOT Vmean:        59.300 cm/s LVOT VTI:          0.177 m LVOT/AV VTI ratio: 0.59  AORTA Ao Root diam: 3.30 cm Ao Asc diam:  3.10 cm  MITRAL VALVE                TRICUSPID VALVE MV Area (PHT): 4.10 cm     TR Peak grad:   37.2 mmHg MV Decel Time: 185 msec     TR Vmax:        305.00 cm/s MV E velocity: 112.00 cm/s MV A velocity: 57.10 cm/s   SHUNTS MV E/A ratio:  1.96         Systemic VTI:  0.18 m Systemic Diam: 1.90 cm  Donato Schultz MD Electronically signed by Donato Schultz MD Signature Date/Time: 04/05/2021/3:22:14 PM    Final    MONITORS  LONG TERM MONITOR (3-14 DAYS) 03/23/2020  Narrative 3 Day Zio Monitor  Quality: Fair.  Baseline artifact. Predominant rhythm: sinus Average heart rate: 84 bpm Max heart rate: 140 bpm Min heart rate: 57 bpm Pauses >2.5 seconds: none Occasional PVCs and ventricular bigeminy Two runs of SVT up to 8 beats.  Rate 224 bpm.   Tiffany C. Duke Salvia, MD, [facility] 04/10/2020 8:10 AM           Risk Assessment/Calculations:            Physical Exam:   VS:  BP (!) 146/94   Pulse 64   Ht 5\' 5"  (1.651 m)   Wt 217 lb (98.4 kg) Comment: wearing boot  LMP  (LMP Unknown)   BMI 36.11 kg/m    Wt Readings from Last 3 Encounters:  11/11/22 217 lb (98.4 kg)  10/02/22 212 lb (96.2 kg)  10/01/22 212 lb 9.6 oz (96.4 kg)    GEN: Well nourished, overweight well developed in no acute distress NECK: No JVD; No carotid bruits CARDIAC: RRR, no murmurs, rubs, gallops RESPIRATORY:  Clear to auscultation without rales, wheezing or rhonchi  ABDOMEN: Soft, non-tender, non-distended EXTREMITIES:  No edema; No deformity   ASSESSMENT AND PLAN: .    Malignant  hypertension - BP not controlled despite compliance with 10 antihypertensive agents. Previously intolerant to Imdur. Present prescribed antihypertensive regimen  includes Clonidine 0.6mg  patch, Amlodipine 10mg  QD, Doxazosin 16mg  QHS, Diltiazem 300mg  daily, Hydralazine 50mg  TID, Chlorthalidone 25mg  QD, Torsemide 40mg  BID, Nebivolol 40mg  QD, Ranexa 1000mg  BID, Spironolactone 100mg  every day.  Secondary workup has been unremarkable.  Sleep apnea treated Scheduled for renal denervation 11/20/22. She will have CBC/BMP on Friday for pre-procedure labs.  Discussed risks and benefits of the planned procedure and she is agreeable to proceed. She understands the <1% risk of serious complications.  Post procedure, she prefers to reduce doses of TID agents (such as Hydralazine). Would avoid reducing her HF or anti-anginal agents. Could also consider reducing Minoxidil, Doxazosin in the future as BP allows post RDN. Secondary workup detailed below.   Renal artery stenosis - Renal duplex 09/2018 bilateral 1-59% stenosis with CT angio abdomen 09/22/19 with no RAS. Renal duplex 01/2023 L renal artery 1-59% stenosed, no right RAS.    Chronic stable angina / CAD - Intolerant of Imdur due to headache.She is on maximum dose Ranexa. GDMT includes Plavix (no Aspirin due to allergy), Nebivolol, Rosuvastatin, Zetia. R/LHC 09/2020 with stable single vessel CAD with apical 80% stenosis unchanged with torturous coronary arteries. Distal LAD lesion not favorable for PCI. No symptoms concerning for worsening angina.   Hypertriglyceridemia / HLD, LDL goal <70 - Continue Repatha, Zetia, Rosuvastatin 40mg  QD. 09/2022 LDL 26.    OSA - BIPAP compliance encouraged. Reports wearing every evening with her oxygen. She follows with Dr. Mayford Knife in regards to her OSA.    Chronic diastolic heart failure - Volume overload today. Extra dose Metolazone today one hour prior to Torsemide today.  Continue current dose of torsemide, metolazone, Entresto,  nebivolol.  Stable renal function despite multiple diuretics. Has BMP upcoming Friday.   Asthma - Follows with pulmonology Dr. Sherene Sires.  No signs of acute exacerbation   SVT - Continue Diltiazem 300mg  daily.  BMI 35 - Weight loss via diet and exercise encouraged. Discussed the impact being overweight would have on cardiovascular risk.10/02/22 A1c 6.1.  Screening for Secondary Hypertension:      07/16/2021   10:34 AM 10/02/2022   11:45 AM  Causes  Drugs/Herbals Screened Screened  Renovascular HTN Screened Screened     - Comments Negative on CT and renal Dopplers 09/2018 bilteral 1-59% stenosis. 08/2019 unermarable abdominal CTA with no RAS. 09/2022 updated renal duplex ordered as >3 years since last study  Sleep Apnea Screened Screened     - Comments Continue BiPAP Wearing BIPAP regularly  Thyroid Disease Screened Screened     - Comments  09/2022 updated thyroid panel performed  Hyperaldosteronism Screened   Pheochromocytoma N/A Screened     - Comments  CT arota 08/2019 with normal adrenal gland  Cushing's Syndrome N/A N/A  Hyperparathyroidism Screened   Coarctation of the Aorta Screened Screened     - Comments BP symmetric BP symmetric  Compliance Screened Screened           Dispo: follow up in 2 months with Dr. Duke Salvia or APP  Signed, Alver Sorrow, NP

## 2022-11-11 NOTE — Patient Instructions (Addendum)
Medication Instructions:  Refilled Repatha Diflucan as directed  *If you need a refill on your cardiac medications before your next appointment, please call your pharmacy*   Lab Work: Lab work on Friday 11/08/2022    Testing/Procedures: Renal denervation as scheduled    Follow-Up: At [facility], you and your health needs are our priority.  As part of our continuing mission to provide you with exceptional heart care, we have created designated Provider Care Teams.  These Care Teams include your primary Cardiologist (physician) and Advanced Practice Providers (APPs -  Physician Assistants and Nurse Practitioners) who all work together to provide you with the care you need, when you need it.  We recommend signing up for the patient portal called "MyChart".  Sign up information is provided on this After Visit Summary.  MyChart is used to connect with patients for Virtual Visits (Telemedicine).  Patients are able to view lab/test results, encounter notes, upcoming appointments, etc.  Non-urgent messages can be sent to your provider as well.   To learn more about what you can do with MyChart, go to [email or web address].    Your next appointment:   2 month(s) Hypertension Clinic  Provider:   Gillian Shields, NP    Other Instructions N/A

## 2022-11-12 ENCOUNTER — Inpatient Hospital Stay: Payer: 59 | Attending: Oncology

## 2022-11-12 ENCOUNTER — Other Ambulatory Visit: Payer: Self-pay

## 2022-11-12 VITALS — BP 148/62

## 2022-11-12 DIAGNOSIS — Z171 Estrogen receptor negative status [ER-]: Secondary | ICD-10-CM | POA: Insufficient documentation

## 2022-11-12 DIAGNOSIS — C50412 Malignant neoplasm of upper-outer quadrant of left female breast: Secondary | ICD-10-CM | POA: Insufficient documentation

## 2022-11-12 DIAGNOSIS — Z5111 Encounter for antineoplastic chemotherapy: Secondary | ICD-10-CM | POA: Diagnosis present

## 2022-11-12 MED ORDER — GOSERELIN ACETATE 3.6 MG ~~LOC~~ IMPL
3.6000 mg | DRUG_IMPLANT | SUBCUTANEOUS | Status: DC
  Administered 2022-11-12: 3.6 mg via SUBCUTANEOUS
  Filled 2022-11-12: qty 3.6

## 2022-11-15 ENCOUNTER — Other Ambulatory Visit: Payer: Self-pay | Admitting: Cardiovascular Disease

## 2022-11-15 LAB — BASIC METABOLIC PANEL
BUN/Creatinine Ratio: 13 (ref 9–23)
BUN: 12 mg/dL (ref 6–24)
CO2: 25 mmol/L (ref 20–29)
Calcium: 9.3 mg/dL (ref 8.7–10.2)
Chloride: 105 mmol/L (ref 96–106)
Creatinine, Ser: 0.94 mg/dL (ref 0.57–1.00)
Glucose: 95 mg/dL (ref 70–99)
Potassium: 3.8 mmol/L (ref 3.5–5.2)
Sodium: 143 mmol/L (ref 134–144)
eGFR: 74 mL/min/{1.73_m2} (ref >59)

## 2022-11-15 LAB — CBC
Hematocrit: 39.9 % (ref 34.0–46.6)
Hemoglobin: 13.1 g/dL (ref 11.1–15.9)
MCH: 29.2 pg (ref 26.6–33.0)
MCHC: 32.8 g/dL (ref 31.5–35.7)
MCV: 89 fL (ref 79–97)
Platelets: 201 10*3/uL (ref 150–450)
RBC: 4.48 x10E6/uL (ref 3.77–5.28)
RDW: 13.2 % (ref 11.7–15.4)
WBC: 7 10*3/uL (ref 3.4–10.8)

## 2022-11-19 ENCOUNTER — Telehealth: Payer: Self-pay | Admitting: *Deleted

## 2022-11-19 NOTE — Telephone Encounter (Addendum)
Renal Denervation scheduled at [facility] for: Wednesday November 20, 2022 8:30 AM Arrival time [facility] Main Entrance A at: 6:30 AM  Nothing to eat after midnight prior to procedure, clear liquids until 5 AM day of procedure.  Medication instructions: -Hold:  Chlorthalidone/Torsemide/Spironolactone/KCl-AM of procedure  Pt reports she takes metolazone weekly on Saturdays. -Other usual morning medications can be taken with sips of water including Plavix 75 mg.  Patient reports she is allergic to aspirin.  Plan to go home the same day, you will only stay overnight if medically necessary.  You must have responsible adult to drive you home.  Someone must be with you the first 24 hours after you arrive home.  Reviewed procedure instructions with patient.

## 2022-11-19 NOTE — Pre-Procedure Instructions (Signed)
Informed patient that procedure time has been moved to 12:30.  Please arrive at the hospital at 10:30.  Patient verbalized understanding.

## 2022-11-20 ENCOUNTER — Other Ambulatory Visit: Payer: Self-pay

## 2022-11-20 ENCOUNTER — Ambulatory Visit (HOSPITAL_COMMUNITY): Admission: RE | Admit: 2022-11-20 | Payer: 59 | Source: Home / Self Care | Admitting: Cardiovascular Disease

## 2022-11-20 ENCOUNTER — Ambulatory Visit (HOSPITAL_COMMUNITY)
Admission: RE | Disposition: A | Discharge status: Home / Self Care | Payer: Self-pay | Source: Home / Self Care | Attending: Cardiovascular Disease

## 2022-11-20 DIAGNOSIS — Z79899 Other long term (current) drug therapy: Secondary | ICD-10-CM | POA: Insufficient documentation

## 2022-11-20 DIAGNOSIS — I110 Hypertensive heart disease with heart failure: Secondary | ICD-10-CM | POA: Insufficient documentation

## 2022-11-20 DIAGNOSIS — I10 Essential (primary) hypertension: Secondary | ICD-10-CM | POA: Diagnosis not present

## 2022-11-20 DIAGNOSIS — E785 Hyperlipidemia, unspecified: Secondary | ICD-10-CM | POA: Diagnosis not present

## 2022-11-20 DIAGNOSIS — I2510 Atherosclerotic heart disease of native coronary artery without angina pectoris: Secondary | ICD-10-CM | POA: Insufficient documentation

## 2022-11-20 DIAGNOSIS — Z6836 Body mass index (BMI) 36.0-36.9, adult: Secondary | ICD-10-CM | POA: Diagnosis not present

## 2022-11-20 DIAGNOSIS — I5032 Chronic diastolic (congestive) heart failure: Secondary | ICD-10-CM | POA: Insufficient documentation

## 2022-11-20 DIAGNOSIS — E781 Pure hyperglyceridemia: Secondary | ICD-10-CM | POA: Insufficient documentation

## 2022-11-20 DIAGNOSIS — I471 Supraventricular tachycardia, unspecified: Secondary | ICD-10-CM | POA: Insufficient documentation

## 2022-11-20 DIAGNOSIS — I1A0 Resistant hypertension: Secondary | ICD-10-CM | POA: Insufficient documentation

## 2022-11-20 DIAGNOSIS — J45909 Unspecified asthma, uncomplicated: Secondary | ICD-10-CM | POA: Diagnosis not present

## 2022-11-20 DIAGNOSIS — I701 Atherosclerosis of renal artery: Secondary | ICD-10-CM | POA: Insufficient documentation

## 2022-11-20 DIAGNOSIS — G4733 Obstructive sleep apnea (adult) (pediatric): Secondary | ICD-10-CM | POA: Insufficient documentation

## 2022-11-20 DIAGNOSIS — Z9221 Personal history of antineoplastic chemotherapy: Secondary | ICD-10-CM | POA: Insufficient documentation

## 2022-11-20 DIAGNOSIS — Z853 Personal history of malignant neoplasm of breast: Secondary | ICD-10-CM | POA: Diagnosis not present

## 2022-11-20 HISTORY — PX: RENAL DENERVATION: CATH118329

## 2022-11-20 SURGERY — RENAL DENERVATION

## 2022-11-20 MED ORDER — ACETAMINOPHEN 325 MG PO TABS
650.0000 mg | ORAL_TABLET | ORAL | Status: DC | PRN
  Administered 2022-11-20: 650 mg via ORAL
  Filled 2022-11-20: qty 2

## 2022-11-20 MED ORDER — SODIUM CHLORIDE 0.9 % IV SOLN: 250.0000 mL | INTRAVENOUS | Status: DC | PRN

## 2022-11-20 MED ORDER — LIDOCAINE HCL (PF) 1 % IJ SOLN
INTRAMUSCULAR | Status: AC
  Filled 2022-11-20: qty 30

## 2022-11-20 MED ORDER — SODIUM CHLORIDE 0.9 % IV SOLN: INTRAVENOUS | Status: DC

## 2022-11-20 MED ORDER — SODIUM CHLORIDE 0.9 % WEIGHT BASED INFUSION
3.0000 mL/kg/h | INTRAVENOUS | Status: AC
  Administered 2022-11-20: 3 mL/kg/h via INTRAVENOUS

## 2022-11-20 MED ORDER — FENTANYL CITRATE (PF) 100 MCG/2ML IJ SOLN
INTRAMUSCULAR | Status: DC | PRN
  Administered 2022-11-20 (×2): 50 ug via INTRAVENOUS

## 2022-11-20 MED ORDER — MIDAZOLAM HCL 2 MG/2ML IJ SOLN
INTRAMUSCULAR | Status: DC | PRN
  Administered 2022-11-20 (×2): 1 mg via INTRAVENOUS

## 2022-11-20 MED ORDER — HEPARIN SODIUM (PORCINE) 1000 UNIT/ML IJ SOLN
INTRAMUSCULAR | Status: DC | PRN
  Administered 2022-11-20: 9000 [IU] via INTRAVENOUS

## 2022-11-20 MED ORDER — MIDAZOLAM HCL 2 MG/2ML IJ SOLN
INTRAMUSCULAR | Status: AC
  Filled 2022-11-20: qty 2

## 2022-11-20 MED ORDER — HYDRALAZINE HCL 20 MG/ML IJ SOLN
INTRAMUSCULAR | Status: AC
  Filled 2022-11-20: qty 1

## 2022-11-20 MED ORDER — SODIUM CHLORIDE 0.9 % WEIGHT BASED INFUSION: 1.0000 mL/kg/h | INTRAVENOUS | Status: DC

## 2022-11-20 MED ORDER — IODIXANOL 320 MG/ML IV SOLN
INTRAVENOUS | Status: DC | PRN
  Administered 2022-11-20: 75 mL

## 2022-11-20 MED ORDER — FENTANYL CITRATE (PF) 100 MCG/2ML IJ SOLN
INTRAMUSCULAR | Status: AC
  Filled 2022-11-20: qty 2

## 2022-11-20 MED ORDER — HEPARIN (PORCINE) IN NACL 1000-0.9 UT/500ML-% IV SOLN
INTRAVENOUS | Status: DC | PRN
  Administered 2022-11-20 (×2): 500 mL

## 2022-11-20 MED ORDER — LIDOCAINE HCL (PF) 1 % IJ SOLN
INTRAMUSCULAR | Status: DC | PRN
  Administered 2022-11-20: 5 mL

## 2022-11-20 MED ORDER — CLOPIDOGREL BISULFATE 75 MG PO TABS: 75.0000 mg | ORAL_TABLET | ORAL | Status: DC

## 2022-11-20 MED ORDER — SODIUM CHLORIDE 0.9% FLUSH: 3.0000 mL | INTRAVENOUS | Status: DC | PRN

## 2022-11-20 MED ORDER — HEPARIN SODIUM (PORCINE) 1000 UNIT/ML IJ SOLN
INTRAMUSCULAR | Status: AC
  Filled 2022-11-20: qty 10

## 2022-11-20 MED ORDER — ONDANSETRON HCL 4 MG/2ML IJ SOLN: 4.0000 mg | Freq: Four times a day (QID) | INTRAMUSCULAR | Status: DC | PRN

## 2022-11-20 MED ORDER — HYDRALAZINE HCL 20 MG/ML IJ SOLN: 10.0000 mg | INTRAMUSCULAR | Status: DC | PRN

## 2022-11-20 MED ORDER — SODIUM CHLORIDE 0.9% FLUSH: 3.0000 mL | Freq: Two times a day (BID) | INTRAVENOUS | Status: DC

## 2022-11-20 SURGICAL SUPPLY — 14 items
BAG SNAP BAND KOVER 36X36 (MISCELLANEOUS) IMPLANT
CATH LAUNCH RDC 7FR 55 (CATHETERS) IMPLANT
CATH PARADISE 5 BALL (CATHETERS) IMPLANT
CATH PARADISE 6 BALL (CATHETERS) IMPLANT
COVER DOME SNAP 22 D (MISCELLANEOUS) IMPLANT
DEVICE CLOSURE MYNXGRIP 6/7F (Vascular Products) IMPLANT
KIT MICROPUNCTURE NIT STIFF (SHEATH) IMPLANT
PARADISE ULTRASOUND PRICE (CATHETERS) IMPLANT
SET ATX-X65L (MISCELLANEOUS) IMPLANT
SHEATH PINNACLE 7F 10CM (SHEATH) IMPLANT
SHEATH PROBE COVER 6X72 (BAG) IMPLANT
VALVE GUARDIAN II ~~LOC~~ HEMO (MISCELLANEOUS) IMPLANT
WIRE HITORQ VERSACORE ST 145CM (WIRE) IMPLANT
WIRE SPARTACORE .014X190CM (WIRE) IMPLANT

## 2022-11-20 NOTE — Interval H&P Note (Signed)
History and Physical Interval Note:  11/20/2022 2:38 PM  Erica [name]  has presented today for surgery, with the diagnosis of hp.  The various methods of treatment have been discussed with the patient and family. After consideration of risks, benefits and other options for treatment, the patient has consented to  Procedure(s): RENAL DENERVATION (N/A) as a surgical intervention.  The patient's history has been reviewed, patient examined, no change in status, stable for surgery.  I have reviewed the patient's chart and labs.  Questions were answered to the patient's satisfaction.     [name]

## 2022-11-21 ENCOUNTER — Telehealth: Payer: Self-pay | Admitting: Cardiovascular Disease

## 2022-11-21 ENCOUNTER — Encounter (HOSPITAL_COMMUNITY): Payer: Self-pay | Admitting: Cardiovascular Disease

## 2022-11-21 LAB — POCT ACTIVATED CLOTTING TIME: Activated Clotting Time: 348 s

## 2022-11-21 MED FILL — Hydralazine HCl Inj 20 MG/ML: INTRAMUSCULAR | Qty: 1 | Status: AC

## 2022-11-21 NOTE — Telephone Encounter (Signed)
See MyChart message

## 2022-11-21 NOTE — Telephone Encounter (Signed)
New Message:     Patient said Dr Erica [name] did her procedure yesterday(11-20-22). She says she needs a note for her husband and daughter for their work. She needs it for today and yesterday please.

## 2022-11-22 ENCOUNTER — Encounter: Payer: Self-pay | Admitting: *Deleted

## 2022-11-22 NOTE — Telephone Encounter (Signed)
The patient stated that she needs a generic work excuse letter for her two family members. They were out of work on Wednesday and Thursday to help with patient care for her procedure.   This can be sent via MyChart.

## 2022-11-25 ENCOUNTER — Telehealth (HOSPITAL_BASED_OUTPATIENT_CLINIC_OR_DEPARTMENT_OTHER): Payer: Self-pay

## 2022-11-25 NOTE — Telephone Encounter (Signed)
-----   Message from Alver Sorrow sent at 11/22/2022  4:58 PM EDT ----- Regular clinic is fine! Or 1 pm on a HTN day. ----- Message ----- From: Marlene Lard, RN Sent: 11/22/2022   4:07 PM EDT To: Burnell Blanks, LPN; Sandi Mariscal, RN; #  Luther Parody, I am not seeing anything open for you or Duke Salvia for HTN clinic, can we see her on a normal day? ----- Message ----- From: Sandi Mariscal, RN Sent: 11/22/2022   4:02 PM EDT To: Burnell Blanks, LPN; Marlene Lard, RN  Hey all!   She recently had the RDN with Dr. Kirke Corin. She needs a 2-3 week follow up over there. Do you have anything? Do you mind setting this up, please?  Thank you! Misty Stanley

## 2022-11-25 NOTE — Telephone Encounter (Signed)
Called patient to schedule RDN follow up, added onto sched for 8/29 at 1pm.

## 2022-11-28 ENCOUNTER — Encounter (HOSPITAL_BASED_OUTPATIENT_CLINIC_OR_DEPARTMENT_OTHER): Payer: Self-pay

## 2022-11-28 ENCOUNTER — Encounter (HOSPITAL_BASED_OUTPATIENT_CLINIC_OR_DEPARTMENT_OTHER): Payer: Self-pay | Admitting: Family

## 2022-11-28 ENCOUNTER — Encounter: Payer: Self-pay | Admitting: Oncology

## 2022-11-28 ENCOUNTER — Telehealth (HOSPITAL_BASED_OUTPATIENT_CLINIC_OR_DEPARTMENT_OTHER): Payer: Self-pay

## 2022-11-28 ENCOUNTER — Ambulatory Visit (INDEPENDENT_AMBULATORY_CARE_PROVIDER_SITE_OTHER): Payer: 59 | Admitting: Family

## 2022-11-28 ENCOUNTER — Other Ambulatory Visit (HOSPITAL_COMMUNITY): Payer: Self-pay

## 2022-11-28 ENCOUNTER — Telehealth: Payer: Self-pay

## 2022-11-28 VITALS — BP 188/84 | HR 85 | Ht 66.0 in | Wt 220.0 lb

## 2022-11-28 DIAGNOSIS — I471 Supraventricular tachycardia, unspecified: Secondary | ICD-10-CM

## 2022-11-28 DIAGNOSIS — G4733 Obstructive sleep apnea (adult) (pediatric): Secondary | ICD-10-CM

## 2022-11-28 DIAGNOSIS — I25118 Atherosclerotic heart disease of native coronary artery with other forms of angina pectoris: Secondary | ICD-10-CM

## 2022-11-28 DIAGNOSIS — E785 Hyperlipidemia, unspecified: Secondary | ICD-10-CM

## 2022-11-28 DIAGNOSIS — I1A0 Resistant hypertension: Secondary | ICD-10-CM | POA: Diagnosis not present

## 2022-11-28 DIAGNOSIS — I5032 Chronic diastolic (congestive) heart failure: Secondary | ICD-10-CM

## 2022-11-28 NOTE — Patient Instructions (Addendum)
Medication Instructions:  Continue your current medications.  If you see your blood pressure is consistently less than 130/80, let us know and we might consider reducing your BP medications.   You have been prescribed a GLP-1 today called Wegovy.  This is a once weekly injection that helps you lose weight and maintain weight loss.  This medication works best when combined with healthy diet and regular exercise.  GLP1 medications help you lose weight by reducing appetite and helping you feel fuller longer. Most common side effects include nausea, vomiting, diarrhea. These side effects can be limited by eating slowly and eating small meals and often improve after a few days. We have Sent a prescription and are awaiting prior authorization from insurance. When it has been obtained we will send it into the pharmacy.     Labwork: Your physician recommends that you return for lab work today: BMP, BNP, CBC    Follow-Up: Follow up as scheduled

## 2022-11-28 NOTE — Telephone Encounter (Signed)
Please test a prior auth for wegovy due to cardiac indications.

## 2022-11-28 NOTE — Telephone Encounter (Signed)
FYI, if no other drugs to attempt I can let patient know

## 2022-11-28 NOTE — Progress Notes (Addendum)
Cardiology Office Note:  .   Date:  11/28/2022  ID:  Erica [name], DOB [birth year], MRN [id number] PCP: Patient, No Pcp Per  [facility] Providers Cardiologist:  Chilton Si, MD    History of Present Illness: .   Erica [name] is a 47 y.o. female with a hx of resistant hypertension, chronic diastolic heart failure, breast cancer s/p chemo and XRT, morbid obesity, OSA on BIPAP, renal artery stenosis, CAD, HLD.   She has history of hypertension dating back to treatment of breast cancer in 2010. She has established with Dr. Duke Salvia in the hypertension clinic. Previous echocardiogram 03/2018 LVEF 45-50%, grade 2 diastolic dysfunction, mild MR, PASP 33. Renal artery duppler 2019 with bilateral 1-59% stenosis. CT-A abdomen with 1.3 cm renal cyst but no adrenal adenomas. No evidence of renal artery stenosis.    She has a hx of recurrent syncope. 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 She has sleep apnea and uses BIPAP. She had LHC 05/2019 with finding of 60% distal LCx and 80% distal LAD disease. She was started on Clopidogrel due to allergy to aspirin. LE duplex 10/2019 for lower extremity edema was negative for DVT.    Blood pressure has been difficult to control. Doxazosin ordered at initial clinic visit. Amlodipine subsequently added. She has been given Metolazone in addition to Torsemide due to edema.  Her hydralazine was reduced to 50mg  TID and doxazosin increased to 8mg  twice daily to try to reduce TID dosing. Ranexa increased due to chest pain with improvement. Clonidine increased to 0.6mg  patch. Monitor 03/23/20  showing PVC with up to 8 beats of SVT.     R/LHC 10/25/20 with distal LAD 80% stenosed, LPAV lesion 30% stenosed with 50% stenosed side branch in first LPL. Hyperdynamic LV systolic function was noted. Hemodynamic findings were consistent with mild secondary pulmonary hypertension. Overall unchanged from previous. Angina was  suspected due to accelerated hypertension, increased wall stress, and microvascular ischemia. Distal LAD not favorable for PCI but could be considered for intractable pain. BP markedly elevated 200s/110s which improved after medication to 152/78.   Echo 04/05/21 normal LVEF 55-60%, mild LVH, normal diastolic parameters, mildly elevated PASP, bilateral atria moderately dilated, mild MR. Based on follow up labs her Metolazone was reduced from 5mg  to 2.5mg  weekly.  Minoxidil was increased to 5 mg twice daily.   At follow up 05/2021 BP 150s/70s at home. She was not taking Doxazosin nor Minoxidil and these were resumed. At follow up 07/16/21, HCTZ transitioned to Chlorthalidone. Doxasozin dose increased.  She wore a 24h BP monitor with average daytime BP 135/68 and during white coat period 182/96. Average overall  BP on monitor 127/65. Suspected inaccurate as lower than her home readings.   At visit 10/02/22 Diltiazem increased to 300mg  every day due to palpitations. Updated renal duplex with minimal stenosis. She had renal denervation 11/20/2022.  Presents today for follow up. Still slightly sore in her right groin with no hematoma nor bruising on exam. Some mild lower back pain since procedure which is partially resolved by heating pack and gradually improving. BP at home has been 140-150s.   ROS: Please see the history of present illness.    All other systems reviewed and are negative.   Studies Reviewed: .        Cardiac Studies & Procedures   CARDIAC CATHETERIZATION  CARDIAC CATHETERIZATION 10/25/2020  Narrative   Dist LAD lesion is 80% stenosed.  LPAV lesion is 30% stenosed with 50% stenosed side branch in 1st LPL.   There is hyperdynamic left ventricular systolic function.  The left ventricular ejection fraction is greater than 65% by visual estimate.   Hemodynamic findings consistent with mild (secondary) pulmonary hypertension - LVEDP ~25 mmHg, PCWP ~19-20 mmHg   Normal Cardiac Output / Index  (7.91 / 3.94)  POST-OPERATIVE DIAGNOSIS:  Stable single-vessel CAD with apical LAD 80% stenosis-no change from previous cath.  Otherwise very tortuous coronary arteries with no significant stenoses.  Mild (likely secondary) Pulmonary Hypertension with PA mean pressure of 28 mmHg (PAP 39/15 mmHg), and PCWP of 19 mmHg with LVEDP of 25 mmHg.  Significant systolic hypertension with initial pressures in the 200s over 110s, however after medication 152/78 mmHg with MAP of 109 mmHg; LVP/EDP 155/13 mmHg - 25 mmHg.  Ao sat 99%, PA sat 81%.  Fick Cardiac Output-Index: 7.91-3.84 (normal)  Suspect angina is due to accelerated hypertension, increased wall stress and microvascular ischemia.  Distal LAD lesion is not favorable for PCI (however if pain is intractable, could be considered)  Findings Coronary Findings Diagnostic  Dominance: Right  Left Main Vessel is large.  Left Anterior Descending Vessel is large. Dist LAD lesion is 80% stenosed. The lesion is focal and eccentric. stable  First Diagonal Branch Vessel is small in size.  Second Diagonal Branch Vessel is small in size.  Third Diagonal Branch Vessel is small in size.  Ramus Intermedius Vessel is large.  Left Circumflex Vessel is large.  First Obtuse Marginal Branch Vessel is small in size.  First Left Posterolateral Branch Vessel is small in size.  Left Posterior Atrioventricular Artery Vessel is small in size. LPAV lesion is 30% stenosed with 50% stenosed side branch in 1st LPL.  Right Coronary Artery Vessel is large.  Acute Marginal Branch Vessel is small in size.  Right Ventricular Branch Vessel is small in size.  Second Right Posterolateral Branch Vessel is small in size.  Intervention  No interventions have been documented.   CARDIAC CATHETERIZATION  CARDIAC CATHETERIZATION 05/04/2019  Narrative  Dist LAD-1 lesion is 30% stenosed.  Dist LAD-2 lesion is 80% stenosed.  Dist Cx lesion is 60%  stenosed.  Findings:  Ao = 173/104 (134) LV =  181/25 RA =  10 RV =  41/13 PA =  42/10 (26) PCW = 18 Fick cardiac output/index = 5.8/2.9 PVR = 1.4 wu Ao sat = 98% PA sat = 73%, 73%  Assessment: 1. 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 LVEF 55-60% 3. Mildly elevated filling pressures with mild pulmonary venous HTN 4. Widely patent bilateral renal arteries. No evidence of AAA 5. Severe systemic HTN  Plan/Discussion:  Medical therapy. Distal LAD lesion is likely high-grade but very distal.  Focus on RF management.  Arvilla Meres, MD 4:49 PM  Findings Coronary Findings Diagnostic  Dominance: Right  Left Main Vessel is angiographically normal.  Left Anterior Descending Dist LAD-1 lesion is 30% stenosed. Dist LAD-2 lesion is 80% stenosed.  Left Circumflex Dist Cx lesion is 60% stenosed.  Intervention  No interventions have been documented.   STRESS TESTS  MYOCARDIAL PERFUSION IMAGING 04/20/2018  Narrative  The left ventricular ejection fraction is moderately decreased (30-44%).  Nuclear stress EF: 41%.  There was no ST segment deviation noted during stress.  This is an intermediate risk study.  Abnormal, intermediate risk stress nuclear study with no ischemia or infarction; EF 41 with global hypokinesis and mild LVE; study intermediate risk due to  reduced LV function.   ECHOCARDIOGRAM  ECHOCARDIOGRAM COMPLETE 04/05/2021  Narrative ECHOCARDIOGRAM REPORT    Patient Name:   Erica [name] Date of Exam: 04/05/2021 Medical Rec #:  [id number]      Height:       65.0 in Accession #:    [id number]     Weight:       216.8 lb Date of Birth:  09-Nov-1975      BSA:          2.047 m Patient Age:    45 years       BP:           198/94 mmHg Patient Gender: F              HR:           71 bpm. Exam Location:  Outpatient  Procedure: 2D Echo, Cardiac Doppler, Color Doppler and Strain Analysis  Indications:    R06.02 SOB; R60.0 Lower extremity edema; Z51.11  Encounter for antineoplastic chemotheraphy  History:        Patient has prior history of Echocardiogram examinations, most recent 03/12/2018. CHF, CAD, Signs/Symptoms:Shortness of Breath and Edema; Risk Factors:Hypertension, Sleep Apnea, Dyslipidemia and Non-Smoker. Patient denies chest pain but does have ongoing SOB with bilateral leg edema. Patient undergoing oral chemotherapy for left breast cancer.  Sonographer:    Carlos American RVT, RDCS (AE), RDMS Referring Phys: [phone] [facility]   Sonographer Comments: Patient is morbidly obese. Image acquisition challenging due to patient body habitus. IMPRESSIONS   1. Left ventricular ejection fraction, by estimation, is 55 to 60%. The left ventricle has normal function. The left ventricle has no regional wall motion abnormalities. There is mild left ventricular hypertrophy. Left ventricular diastolic parameters were normal. The average left ventricular global longitudinal strain is -15.0 %. The global longitudinal strain is abnormal. 2. Right ventricular systolic function is normal. The right ventricular size is mildly enlarged. There is mildly elevated pulmonary artery systolic pressure. The estimated right ventricular systolic pressure is 40.2 mmHg. 3. Left atrial size was moderately dilated. 4. Right atrial size was moderately dilated. 5. The mitral valve is normal in structure. Mild mitral valve regurgitation. No evidence of mitral stenosis. 6. The aortic valve is normal in structure. Aortic valve regurgitation is not visualized. No aortic stenosis is present. 7. The inferior vena cava is normal in size with greater than 50% respiratory variability, suggesting right atrial pressure of 3 mmHg.  Comparison(s): EF 45%, moderate LVH, GLS -13.1%.  FINDINGS Left Ventricle: Left ventricular ejection fraction, by estimation, is 55 to 60%. The left ventricle has normal function. The left ventricle has no regional wall motion abnormalities.  The average left ventricular global longitudinal strain is -15.0 %. The global longitudinal strain is abnormal. 3D left ventricular ejection fraction analysis performed but not reported based on interpreter judgement due to suboptimal tracking. The left ventricular internal cavity size was normal in size. There is mild left ventricular hypertrophy. Left ventricular diastolic parameters were normal.  Right Ventricle: The right ventricular size is mildly enlarged. No increase in right ventricular wall thickness. Right ventricular systolic function is normal. There is mildly elevated pulmonary artery systolic pressure. The tricuspid regurgitant velocity is 3.05 m/s, and with an assumed right atrial pressure of 3 mmHg, the estimated right ventricular systolic pressure is 40.2 mmHg.  Left Atrium: Left atrial size was moderately dilated.  Right Atrium: Right atrial size was moderately dilated.  Pericardium: There is no evidence of pericardial effusion.  Mitral Valve: The mitral valve is normal in structure. Mild mitral valve regurgitation, with posteriorly-directed jet. No evidence of mitral valve stenosis.  Tricuspid Valve: The tricuspid valve is normal in structure. Tricuspid valve regurgitation is mild . No evidence of tricuspid stenosis.  Aortic Valve: The aortic valve is normal in structure. Aortic valve regurgitation is not visualized. No aortic stenosis is present. Aortic valve mean gradient measures 4.0 mmHg. Aortic valve peak gradient measures 8.4 mmHg. Aortic valve area, by VTI measures 1.67 cm.  Pulmonic Valve: The pulmonic valve was normal in structure. Pulmonic valve regurgitation is mild. No evidence of pulmonic stenosis.  Aorta: The aortic root is normal in size and structure.  Venous: The inferior vena cava is normal in size with greater than 50% respiratory variability, suggesting right atrial pressure of 3 mmHg.  IAS/Shunts: No atrial level shunt detected by color flow  Doppler.   LEFT VENTRICLE PLAX 2D LVIDd:         4.78 cm     Diastology LVIDs:         3.08 cm     LV e' medial:    7.83 cm/s LV PW:         1.31 cm     LV E/e' medial:  14.3 LV IVS:        1.18 cm     LV e' lateral:   7.07 cm/s LVOT diam:     1.90 cm     LV E/e' lateral: 15.8 LV SV:         50 LV SV Index:   25          2D Longitudinal Strain LVOT Area:     2.84 cm    2D Strain GLS Avg:     -15.0 %  LV Volumes (MOD) LV vol d, MOD A2C: 76.5 ml 3D Volume EF: LV vol d, MOD A4C: 88.9 ml 3D EF:        49 % LV vol s, MOD A2C: 36.4 ml LV EDV:       149 ml LV vol s, MOD A4C: 39.7 ml LV ESV:       76 ml LV SV MOD A2C:     40.1 ml LV SV:        73 ml LV SV MOD A4C:     88.9 ml LV SV MOD BP:      45.0 ml  RIGHT VENTRICLE RV S prime:     13.60 cm/s TAPSE (M-mode): 2.6 cm  LEFT ATRIUM              Index        RIGHT ATRIUM           Index LA diam:        4.50 cm  2.20 cm/m   RA Area:     20.80 cm LA Vol (A2C):   87.8 ml  42.89 ml/m  RA Volume:   67.70 ml  33.07 ml/m LA Vol (A4C):   126.0 ml 61.55 ml/m LA Biplane Vol: 105.0 ml 51.29 ml/m AORTIC VALVE                     PULMONIC VALVE AV Area (Vmax):    1.70 cm      PV Vmax:          1.06 m/s AV Area (Vmean):   1.68 cm      PV Peak grad:     4.5 mmHg AV Area (  VTI):     1.67 cm      PR End Diast Vel: 9.49 msec AV Vmax:           145.00 cm/s AV Vmean:          100.000 cm/s AV VTI:            0.300 m AV Peak Grad:      8.4 mmHg AV Mean Grad:      4.0 mmHg LVOT Vmax:         87.00 cm/s LVOT Vmean:        59.300 cm/s LVOT VTI:          0.177 m LVOT/AV VTI ratio: 0.59  AORTA Ao Root diam: 3.30 cm Ao Asc diam:  3.10 cm  MITRAL VALVE                TRICUSPID VALVE MV Area (PHT): 4.10 cm     TR Peak grad:   37.2 mmHg MV Decel Time: 185 msec     TR Vmax:        305.00 cm/s MV E velocity: 112.00 cm/s MV A velocity: 57.10 cm/s   SHUNTS MV E/A ratio:  1.96         Systemic VTI:  0.18 m Systemic Diam: 1.90 cm  Donato Schultz  MD Electronically signed by Donato Schultz MD Signature Date/Time: 04/05/2021/3:22:14 PM    Final    MONITORS  LONG TERM MONITOR (3-14 DAYS) 03/23/2020  Narrative 3 Day Zio Monitor  Quality: Fair.  Baseline artifact. Predominant rhythm: sinus Average heart rate: 84 bpm Max heart rate: 140 bpm Min heart rate: 57 bpm Pauses >2.5 seconds: none Occasional PVCs and ventricular bigeminy Two runs of SVT up to 8 beats.  Rate 224 bpm.   Tiffany C. Duke Salvia, MD, [facility] 04/10/2020 8:10 AM           Risk Assessment/Calculations:            Physical Exam:   VS:  LMP  (LMP Unknown)    Wt Readings from Last 3 Encounters:  11/20/22 212 lb (96.2 kg)  11/11/22 217 lb (98.4 kg)  10/02/22 212 lb (96.2 kg)    GEN: Well nourished, overweight well developed in no acute distress NECK: No JVD; No carotid bruits CARDIAC: RRR, no murmurs, rubs, gallops RESPIRATORY:  Clear to auscultation without rales, wheezing or rhonchi  ABDOMEN: Soft, non-tender, non-distended EXTREMITIES:  No edema; No deformity   ASSESSMENT AND PLAN: .    Malignant hypertension - s/p renal denervation 11/20/22. Current regimen Clonidine 0.6mg  patch, Amlodipine 10mg  QD, Doxazosin 16mg  QHS, Diltiazem 300mg  daily, Hydralazine 50mg  TID, Chlorthalidone 25mg  QD, Torsemide 40mg  BID, Nebivolol 40mg  QD, Ranexa 1000mg  BID, Spironolactone 100mg  every day.  Prior secondary workup has been unremarkable.  Sleep apnea treated BP at home 140-150s. Elevated in clinic though notes she was rushing with initial BP 204/117 with repeat 188/84. She will contact us if SBP at home routinely <120/80, if noted plan to reduce frequency of Hydralazine vs discontinue Minoxidil.   PAD / Renal artery stenosis - Renal duplex 09/2018 bilateral 1-59% stenosis with CT angio abdomen 09/22/19 with no RAS. Renal duplex 01/2023 L renal artery 1-59% stenosed, no right RAS. Continue Repatha, Zetia, Rosuvastatin.Given PAD would benefit from [facility] for reduction of  cardiovascular risk.   Chronic stable angina / CAD - Intolerant of Imdur due to headache. She is on maximum dose Ranexa. GDMT includes Plavix (no Aspirin due to allergy), Nebivolol, Rosuvastatin, Zetia.  R/LHC 09/2020 with stable single vessel CAD with apical 80% stenosis unchanged with torturous coronary arteries. Distal LAD lesion not favorable for PCI. No symptoms concerning for worsening angina. Given CAD would benefit from [facility] for reduction of cardiovascular risk, will submit prior auth to see if covered by her insurance.    Hypertriglyceridemia / HLD, LDL goal <70 - Continue Repatha, Zetia, Rosuvastatin 40mg  QD. 09/2022 LDL 26.    OSA - BIPAP compliance encouraged. Reports wearing every evening with her oxygen. She follows with Dr. Mayford Knife.   Chronic diastolic heart failure -  Continue current dose of torsemide, metolazone, Entresto, nebivolol.  Stable renal function despite multiple diuretics. She does note she feels more dyspnea - update BMP/BNP/CBC.   Asthma - Follows with pulmonology Dr. Sherene Sires.  No signs of acute exacerbation   SVT - Continue Diltiazem 300mg  daily.  BMI 35 - Weight loss via diet and exercise encouraged. Discussed the impact being overweight would have on cardiovascular risk.10/02/22 A1c 6.1.   Screening for Secondary Hypertension:      07/16/2021   10:34 AM 10/02/2022   11:45 AM  Causes  Drugs/Herbals Screened Screened  Renovascular HTN Screened Screened     - Comments Negative on CT and renal Dopplers 09/2018 bilteral 1-59% stenosis. 08/2019 unermarable abdominal CTA with no RAS. 09/2022 updated renal duplex ordered as >3 years since last study  Sleep Apnea Screened Screened     - Comments Continue BiPAP Wearing BIPAP regularly  Thyroid Disease Screened Screened     - Comments  09/2022 updated thyroid panel performed  Hyperaldosteronism Screened   Pheochromocytoma N/A Screened     - Comments  CT arota 08/2019 with normal adrenal gland  Cushing's Syndrome N/A N/A   Hyperparathyroidism Screened   Coarctation of the Aorta Screened Screened     - Comments BP symmetric BP symmetric  Compliance Screened Screened           Dispo: follow up in 2 months with Dr. Duke Salvia or APP  Signed, Alver Sorrow, NP

## 2022-11-28 NOTE — Telephone Encounter (Signed)
  PER TEST CLAIM: WEGOVY IS A PLAN BENEFIT EXCLUSION AND ALSO NOT ON FORMULARY

## 2022-11-29 ENCOUNTER — Other Ambulatory Visit (HOSPITAL_COMMUNITY): Payer: Self-pay

## 2022-11-29 ENCOUNTER — Telehealth: Payer: Self-pay

## 2022-11-29 LAB — CBC
Hematocrit: 40.8 % (ref 34.0–46.6)
Hemoglobin: 13.5 g/dL (ref 11.1–15.9)
MCH: 29.9 pg (ref 26.6–33.0)
MCHC: 33.1 g/dL (ref 31.5–35.7)
MCV: 90 fL (ref 79–97)
Platelets: 213 10*3/uL (ref 150–450)
RBC: 4.52 x10E6/uL (ref 3.77–5.28)
RDW: 13.3 % (ref 11.7–15.4)
WBC: 6.6 10*3/uL (ref 3.4–10.8)

## 2022-11-29 LAB — BASIC METABOLIC PANEL
BUN/Creatinine Ratio: 9 (ref 9–23)
BUN: 8 mg/dL (ref 6–24)
CO2: 25 mmol/L (ref 20–29)
Calcium: 9.2 mg/dL (ref 8.7–10.2)
Chloride: 105 mmol/L (ref 96–106)
Creatinine, Ser: 0.9 mg/dL (ref 0.57–1.00)
Glucose: 96 mg/dL (ref 70–99)
Potassium: 3.8 mmol/L (ref 3.5–5.2)
Sodium: 143 mmol/L (ref 134–144)
eGFR: 78 mL/min/{1.73_m2} (ref >59)

## 2022-11-29 LAB — BRAIN NATRIURETIC PEPTIDE: BNP: 45.6 pg/mL (ref 0.0–100.0)

## 2022-11-29 NOTE — Telephone Encounter (Signed)
Pharmacy Patient Advocate Encounter   Received notification from Physician's Office/RN Lendon Collar. that prior authorization for [employer] is required/requested.   Insurance verification completed.   The patient is insured through [facility] .   Per test claim: PA required; PA submitted [facility] via CoverMyMeds Key/confirmation #/EOC [id number]    Status is pending

## 2022-11-29 NOTE — Telephone Encounter (Signed)
To confirm, was not covered for ICD10 I25.10?  Thanks,  Alver Sorrow, NP

## 2022-11-29 NOTE — Telephone Encounter (Signed)
P/A for Erica [name] has now been initiated and is currently pending  Key: [id number]

## 2022-12-03 ENCOUNTER — Other Ambulatory Visit (HOSPITAL_COMMUNITY): Payer: Self-pay

## 2022-12-03 NOTE — Telephone Encounter (Signed)
Pharmacy Patient Advocate Encounter  Received notification from [facility] that Prior Authorization for [facility] has been DENIED.  Full denial letter will be uploaded to the media tab. See denial reason below.    ''WEGOVY INJ 0.25MG  is denied because it is not on your plan's Drug List (formulary). Medication authorization requires the following: (1) Your provider submits medical records (for example: chart notes) documenting established cardiovascular disease as evidenced by one of the following: Prior myocardial infarction, prior stroke (that is, ischemic or hemorrhagic stroke), peripheral arterial disease (that is, intermittent claudication with ankle-brachial index less than 0.85, peripheral arterial revascularization procedure, or amputation due to atherosclerotic disease)''

## 2022-12-03 NOTE — Telephone Encounter (Signed)
Hi Shaniqua, Did we include my office note in the application? If so, not much more we can do. If we not, please send.   TY! Alver Sorrow, NP

## 2022-12-03 NOTE — Telephone Encounter (Signed)
Can we add this to our list to talk to [place] about the next time she is here?  And reach out to let Miss Cina know not covered at this time, please.  TY!

## 2022-12-03 NOTE — Telephone Encounter (Signed)
Yes, the most recent chartnotes were included along with the diagnosis.

## 2022-12-05 ENCOUNTER — Inpatient Hospital Stay: Payer: 59 | Admitting: Adult Health

## 2022-12-05 ENCOUNTER — Inpatient Hospital Stay: Payer: 59

## 2022-12-05 ENCOUNTER — Other Ambulatory Visit (HOSPITAL_BASED_OUTPATIENT_CLINIC_OR_DEPARTMENT_OTHER): Payer: Self-pay | Admitting: Family

## 2022-12-05 NOTE — Telephone Encounter (Signed)
Rx request sent to pharmacy.  

## 2022-12-09 NOTE — Telephone Encounter (Signed)
Although she clearly has CAD/PAD, she doesn't meet the definition used in trial and therefore what the insurance company is basing criteria off of. Erica [name]- no reason to check ABI is there?

## 2022-12-09 NOTE — Telephone Encounter (Signed)
Hi,            Please reassess this. I have submitted the diagnosis code that was requested and attached the denial criteria. Unfortunately, if the patient doesn't meet the requirements than the updated chart notes wont result in an approval.

## 2022-12-10 ENCOUNTER — Inpatient Hospital Stay: Payer: 59 | Attending: Adult Health | Admitting: Adult Health

## 2022-12-10 ENCOUNTER — Encounter: Payer: Self-pay | Admitting: Adult Health

## 2022-12-10 ENCOUNTER — Inpatient Hospital Stay: Payer: 59

## 2022-12-10 VITALS — BP 194/101 | HR 91 | Temp 97.9°F | Resp 18 | Ht 66.0 in | Wt 214.7 lb

## 2022-12-10 DIAGNOSIS — C50412 Malignant neoplasm of upper-outer quadrant of left female breast: Secondary | ICD-10-CM | POA: Diagnosis present

## 2022-12-10 DIAGNOSIS — Z171 Estrogen receptor negative status [ER-]: Secondary | ICD-10-CM | POA: Diagnosis not present

## 2022-12-10 DIAGNOSIS — Z79899 Other long term (current) drug therapy: Secondary | ICD-10-CM | POA: Insufficient documentation

## 2022-12-10 DIAGNOSIS — Z5111 Encounter for antineoplastic chemotherapy: Secondary | ICD-10-CM | POA: Diagnosis present

## 2022-12-10 MED ORDER — GOSERELIN ACETATE 3.6 MG ~~LOC~~ IMPL
3.6000 mg | DRUG_IMPLANT | SUBCUTANEOUS | Status: DC
  Administered 2022-12-10: 3.6 mg via SUBCUTANEOUS
  Filled 2022-12-10: qty 3.6

## 2022-12-10 NOTE — Progress Notes (Signed)
[facility] Cancer Follow up:    Patient, No Pcp Per No address on file   DIAGNOSIS:  Cancer Staging  Malignant neoplasm of upper-outer quadrant of left breast in female, estrogen receptor negative (HCC) Staging form: Breast, AJCC 7th Edition - Clinical: Stage IIB (T2, N1, M0) - Signed by Lowella Dell, MD on 10/20/2014   SUMMARY OF ONCOLOGIC HISTORY:  [name] woman with history of locally advanced left breast carcinoma initially presenting in March 2010    (1) treated neoadjuvantly with 4 cycles of docetaxel/ cyclophosphamide.     (2) status post left lumpectomy and sentinel lymph node dissection in July 2010 for a ypT2 ypN1, stage IIB invasive ductal carcinoma, grade 3, triple negative, with MIB-1 of 88%.    (3) status post full axillary dissection and margin clearance in August 2010.    (4) status post 11 doses of weekly paclitaxel completed in December 2010    (5) radiation therapy completed in March 2011; now followed with observation alone.    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 female returns for f/u of her history of breast cancer.  She is doing moderately well today.  She notes that since April she has had several right breast cysts that have been problematic for her.  Her most recent bilateral breast diagnostic mammogram occurred on Aug 21, 2022 demonstrating a resolving sebaceous cyst, no evidence of malignancy, and breast density category B.  Unilateral breast mammogram occurred on November 06, 2022 for abscess evaluation that was measured at 1.3 cm.  Patient was recommended a  surgical consultation but tells me this never happened.  She notes with warm compresses her abscess improved.  On November 20, 2022 Erica [name] underwent renal denervation for her resistant hypertension and notes that she has had back pain since.  She tells me that she may be in back pain for 8 weeks.  She tells me her at home blood pressure has been much better than her blood pressures in the doctor's offices.  Otherwise she is feeling well and is continuing to undergo close follow-up with cardiology.  Patient Active Problem List   Diagnosis Date Noted   Chronic respiratory failure with hypoxia (HCC) 12/31/2021   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   Coronary artery disease    Chronic stable angina 08/22/2020   SVT (supraventricular tachycardia) 08/22/2020   Palpitations 02/28/2020   Right ankle pain 04/14/2019   OSA (obstructive sleep apnea) 10/01/2018   Tibialis posterior tendinitis, left 07/15/2018   Chronic pain in left foot 06/29/2018   Vitamin D deficiency disease 03/31/2018   Persistent proteinuria 03/31/2018   Hyperlipidemia LDL goal <100 03/30/2018   Chemotherapy-induced peripheral neuropathy (HCC) 12/09/2016   Asthma in adult, mild persistent, uncomplicated 01/19/2016   Hypertensive heart disease 09/26/2015   Depression with somatization 06/15/2015   Hypertriglyceridemia without hypercholesterolemia 06/15/2015   Hypokalemia 06/15/2015   Insomnia secondary to anxiety 04/05/2015   Idiopathic intracranial hypertension 10/21/2014   Upper airway cough syndrome 10/09/2014   Malignant hypertension 10/09/2014   Morbid obesity due to excess calories (HCC) 10/09/2014   Apnea, sleep 05/04/2014   Acute on chronic diastolic CHF (congestive heart failure),  NYHA class 2 (HCC) 05/03/2014   DOE (dyspnea on exertion) 07/14/2013   Acute on chronic diastolic heart failure (HCC) 06/23/2013   Malignant neoplasm of upper-outer quadrant of left breast in female, estrogen receptor negative (HCC) 01/07/2013    Gastroparesis 01/04/2013   Resistant hypertension 09/17/2011   Axillary hidradenitis suppurativa 08/01/2011   Preventative health care 04/13/2011   Hyperglycemia 04/13/2011   GERD (gastroesophageal reflux disease) 03/18/2011   Cough variant asthma 09/27/2010    is allergic to aspirin, bactrim [sulfamethoxazole-trimethoprim], doxycycline, kiwi extract, metoprolol, penicillins, and strawberry extract.  MEDICAL HISTORY: Past Medical History:  Diagnosis Date   Allergy    Anxiety    Apnea, sleep 05/04/2014   AHI 31/hr now on CPAP at 12cm H2O   Arthritis    Asthma    Asthma without status asthmaticus 01/19/2016   Axillary hidradenitis suppurativa 08/01/2011   Blood transfusion without reported diagnosis    Breast cancer (HCC) 2010   a. locally advanced left breast carcinoma diagnosed in 2010 and treated with neoadjuvant chemotherapy with docetaxel and cyclophosphamide as well as paclitaxel. This was followed by radiation therapy which was completed in 2011.   Chemotherapy-induced peripheral neuropathy (HCC) 12/09/2016   CHF (congestive heart failure) (HCC)    Chronic diastolic CHF (congestive heart failure) (HCC) 06/23/2013   Chronic kidney disease    Chronic pain in left foot 06/29/2018   Chronic stable angina 08/22/2020   Cough variant asthma 09/27/2010   Followed in Pulmonary clinic/ [facility]/ [facility]  Onset reported in childhood  - PFT's 01/31/2011 FEV1  1.76 (60% ) ratio 68 and no better p B2,  DLCO 70% -med review 09/25/2012 > [place] drug assistance  paperwork given    - HFA 90% 11/20/2012 .  -Med calendar 12/07/12 , 04/06/2013 , 04/18/2014,, 08/30/2014 , 11/08/2014  - PFT's 01/12/2013  1.73 (65%)  Ratio 73 and no better p B2, DLCO 86% - trial off    Depression with somatization 06/15/2015   Dyspnea on exertion    a. 01/2013 Lexi MV: EF 54%, no ischemia/infarct;  b. 05/2013 Echo: EF 60-65%, no rwma, Gr 2 DD;  b.    Essential hypertension 10/09/2014   Estimated Creatinine Clearance: 82.4 mL/min  (by C-G formula based on SCr of 0.98 mg/dL).   Fatty liver    Gastroparesis    GERD (gastroesophageal reflux disease)    Hyperglycemia 04/13/2011   Hypertension    Hypertensive heart disease 09/26/2015   Hypertriglyceridemia without hypercholesterolemia 06/15/2015   Framingham risk score is 1%    Idiopathic intracranial hypertension 10/21/2014   Impaired glucose tolerance 04/13/2011   Insomnia secondary to anxiety 04/05/2015   Lymphadenitis, chronic    restricted LEFT extremity   Malignant neoplasm of upper-outer quadrant of left breast in female, estrogen receptor negative (HCC) 01/07/2013   Stage III, Invasive Ductal, s/p partial Left Mastectomy/Lumpectomy (baseball sized) with Lymph Node dissection, had Chemo and RadRx     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   OSA (obstructive sleep apnea) 05/04/2014   AHI 31/hr now on CPAP at 12cm H2O   Oxygen deficiency    Uses 02 in CPAP   Persistent proteinuria 03/31/2018   Personal history of chemotherapy 2010   Personal history of radiation therapy 2010   Resistant hypertension 09/17/2011       Sleep apnea    SVT (supraventricular tachycardia) 08/22/2020   Tibialis posterior tendinitis, left 07/15/2018   Injected in March 10, 2019   Upper airway cough syndrome 10/09/2014  Onset around 2012 with assoc pseudowheeze MRI 05/18/15 > Paranasal sinuses are clear - 11/17/2015 rec increase h1 to 2 hs to help with noct cough  - Allergy profile 11/17/2015 >  Eos 0.1 /  IgE  80 POS RAST  to grass, ragweed, cedar tree  - d/c acei 03/06/2018  - flare end of Jan 2020 in setting of ? Uri/ poorly controlled GERD - flare mid Oct 2020 with uri / assoc secondary gerd   Vitamin D deficiency disease 03/31/2018    SURGICAL HISTORY: Past Surgical History:  Procedure Laterality Date   BRAIN SURGERY     Shunt   BREAST LUMPECTOMY Left 09/2008   CARPAL TUNNEL RELEASE Left 2011   LYMPH NODE DISSECTION Left 2010   breast; 2 wks after breast lumpectomy   RENAL DENERVATION  N/A 11/20/2022   Procedure: RENAL DENERVATION;  Surgeon: Iran Ouch, MD;  Location: [facility] INVASIVE CV LAB;  Service: Cardiovascular;  Laterality: N/A;   RIGHT/LEFT HEART CATH AND CORONARY ANGIOGRAPHY N/A 05/04/2019   Procedure: RIGHT/LEFT HEART CATH AND CORONARY ANGIOGRAPHY;  Surgeon: Dolores Patty, MD;  Location: [facility] INVASIVE CV LAB;  Service: Cardiovascular;  Laterality: N/A;  Renal injection preformed    RIGHT/LEFT HEART CATH AND CORONARY ANGIOGRAPHY N/A 10/25/2020   Procedure: RIGHT/LEFT HEART CATH AND CORONARY ANGIOGRAPHY;  Surgeon: Marykay Lex, MD;  Location: [facility] CV LAB;  Service: Cardiovascular;  Laterality: N/A;   TENDON RELEASE Right 2012   "hand"   TUBAL LIGATION  05/11/2011   Procedure: POST PARTUM TUBAL LIGATION;  Surgeon: Scheryl Darter, MD;  Location: [facility] ORS;  Service: Gynecology;  Laterality: Bilateral;  Induced for HTN    SOCIAL HISTORY: Social History   Socioeconomic History   Marital status: Single    Spouse name: Not on file   Number of children: 3   Years of education: Not on file   Highest education level: Not on file  Occupational History    Employer: UNEMPLOYED  Tobacco Use   Smoking status: Never   Smokeless tobacco: Never  Vaping Use   Vaping status: Never Used  Substance and Sexual Activity   Alcohol use: Yes    Alcohol/week: 0.0 standard drinks of alcohol    Comment: occasionally/socially   Drug use: No   Sexual activity: Yes    Birth control/protection: Surgical  Other Topics Concern   Not on file  Social History Narrative   She lives with four children.   She is currently not working (disbaility pending).   Left handed   One story home   Drinks caffeine      Social Determinants of Health   Financial Resource Strain: Medium Risk (03/03/2020)   Overall Financial Resource Strain (CARDIA)    Difficulty of Paying Living Expenses: Somewhat hard  Food Insecurity: No Food Insecurity (04/11/2020)   Hunger Vital Sign    Worried About  Running Out of Food in the Last Year: Never true    Ran Out of Food in the Last Year: Never true  Transportation Needs: No Transportation Needs (02/29/2020)   PRAPARE - Administrator, Civil Service (Medical): No    Lack of Transportation (Non-Medical): No  Physical Activity: Not on file  Stress: Stress Concern Present (06/16/2020)   [employer] of Occupational Health - Occupational Stress Questionnaire    Feeling of Stress : Rather much  Social Connections: Not on file  Intimate Partner Violence: Not on file    FAMILY HISTORY: Family History  Problem  Relation Age of Onset   Other Mother 17       breast calcifications treated with surgery, breast cancer pill, and radiation   Fibroids Sister 42       s/p TAH-BSO   Diabetes Maternal Grandmother    Heart disease Maternal Grandmother    Hypertension Maternal Grandmother    CAD Maternal Grandmother    Breast cancer Other        maternal great aunt (MGM's sister)   Breast cancer Other        paternal great aunt dx middle ages; s/p mastectomy   Cancer Neg Hx    Alcohol abuse Neg Hx    Early death Neg Hx    Hyperlipidemia Neg Hx    Kidney disease Neg Hx    Stroke Neg Hx    Colon cancer Neg Hx    Esophageal cancer Neg Hx    Stomach cancer Neg Hx    Rectal cancer Neg Hx    Colon polyps Neg Hx     Review of Systems  Constitutional:  Negative for appetite change, chills, fatigue, fever and unexpected weight change.  HENT:   Negative for hearing loss, lump/mass and trouble swallowing.   Eyes:  Negative for eye problems and icterus.  Respiratory:  Negative for chest tightness, cough and shortness of breath.   Cardiovascular:  Negative for chest pain, leg swelling and palpitations.  Gastrointestinal:  Negative for abdominal distention, abdominal pain, constipation, diarrhea, nausea and vomiting.  Endocrine: Negative for hot flashes.  Genitourinary:  Negative for difficulty urinating.   Musculoskeletal:  Negative  for arthralgias.  Skin:  Negative for itching and rash.  Neurological:  Negative for dizziness, extremity weakness, headaches and numbness.  Hematological:  Negative for adenopathy. Does not bruise/bleed easily.  Psychiatric/Behavioral:  Negative for depression. The patient is not nervous/anxious.       PHYSICAL EXAMINATION   Onc Performance Status - 12/10/22 0912       ECOG Perf Status   ECOG Perf Status Restricted in physically strenuous activity but ambulatory and able to carry out work of a light or sedentary nature, e.g., light house work, office work      KPS SCALE   KPS % SCORE Able to carry on normal activity, minor s/s of disease             Vitals:   12/10/22 0850  BP: (!) 194/101  Pulse: 91  Resp: 18  Temp: 97.9 F (36.6 C)  SpO2: 99%    Physical Exam Constitutional:      General: She is not in acute distress.    Appearance: Normal appearance. She is not toxic-appearing.  HENT:     Head: Normocephalic and atraumatic.     Mouth/Throat:     Mouth: Mucous membranes are moist.     Pharynx: Oropharynx is clear. No oropharyngeal exudate or posterior oropharyngeal erythema.  Eyes:     General: No scleral icterus. Cardiovascular:     Rate and Rhythm: Normal rate and regular rhythm.     Pulses: Normal pulses.     Heart sounds: Normal heart sounds.  Pulmonary:     Effort: Pulmonary effort is normal.     Breath sounds: Normal breath sounds.  Chest:     Comments: Right breast abscess is healed.  There is no sign of a sebaceous cyst or abscess in the right breast.  Benign. Abdominal:     General: Abdomen is flat. Bowel sounds are normal. There is no distension.  Palpations: Abdomen is soft.     Tenderness: There is no abdominal tenderness.  Musculoskeletal:        General: No swelling.     Cervical back: Neck supple.  Lymphadenopathy:     Cervical: No cervical adenopathy.  Skin:    General: Skin is warm and dry.     Findings: No rash.  Neurological:      General: No focal deficit present.     Mental Status: She is alert.  Psychiatric:        Mood and Affect: Mood normal.        Behavior: Behavior normal.     LABORATORY DATA:  Labs reviewed from prior cardiology appointment   ASSESSMENT and THERAPY PLAN:   Malignant neoplasm of upper-outer quadrant of left breast in female, estrogen receptor negative (HCC) Erica [name] is a 47 year old woman with history of left-sided stage IIb triple negative breast cancer diagnosed in 2010 status post neoadjuvant chemotherapy, adjuvant chemotherapy, and adjuvant radiation to the left breast.  History of left-sided 2B triple negative breast cancer: She has no clinical or radiographic signs of breast cancer recurrence.  She will continue with annual mammograms next due in May 2025. She continues on Zoladex every 4 weeks with good tolerance Resistant HTN: s/p renal denervation, continue f/u with cardiology Previous breast abscess: resolved.  Recommended in the future she take a picture of it and send it to me through MyChart if it recurs.    RTC every 4 weeks for injection and f/u in 24 weeks prior to injection.    All questions were answered. The patient knows to call the clinic with any problems, questions or concerns. We can certainly see the patient much sooner if necessary.  Total encounter time:30 minutes*in face-to-face visit time, chart review, lab review, care coordination, order entry, and documentation of the encounter time.  Lillard Anes, NP 12/10/22 9:49 AM Medical Oncology and Hematology [facility] [address] [place], [place] [address] Tel. [phone]    Fax. [phone]  *Total Encounter Time as defined by the Centers for Medicare and Medicaid Services includes, in addition to the face-to-face time of a patient visit (documented in the note above) non-face-to-face time: obtaining and reviewing outside history, ordering and reviewing medications, tests or  procedures, care coordination (communications with other health care professionals or caregivers) and documentation in the medical record.

## 2022-12-10 NOTE — Assessment & Plan Note (Signed)
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  History of left-sided 2B triple negative breast cancer: She has no clinical or radiographic signs of breast cancer recurrence.  She will continue with annual mammograms next due in May 2025. She continues on Zoladex every 4 weeks with good tolerance Resistant HTN: s/p renal denervation, continue f/u with cardiology Previous breast abscess: resolved.  Recommended in the future she take a picture of it and send it to me through MyChart if it recurs.    RTC every 4 weeks for injection and f/u in 24 weeks prior to injection.

## 2022-12-11 ENCOUNTER — Other Ambulatory Visit: Payer: Self-pay | Admitting: Cardiovascular Disease

## 2022-12-11 NOTE — Telephone Encounter (Signed)
Not based on our last OV. I put myself a reminder to discuss with her at her upcoming office visit. Ultimately if she has renal stenosis and CAD would not be surprised if she has LE PAD.  Alver Sorrow, NP

## 2022-12-11 NOTE — Telephone Encounter (Signed)
Rx request sent to pharmacy.  

## 2022-12-25 ENCOUNTER — Other Ambulatory Visit: Payer: Self-pay

## 2022-12-25 ENCOUNTER — Emergency Department (HOSPITAL_BASED_OUTPATIENT_CLINIC_OR_DEPARTMENT_OTHER)
Admission: EM | Admit: 2022-12-25 | Discharge: 2022-12-25 | Disposition: A | Discharge status: Home / Self Care | Payer: 59 | Attending: Emergency Medicine | Admitting: Emergency Medicine

## 2022-12-25 ENCOUNTER — Emergency Department (HOSPITAL_BASED_OUTPATIENT_CLINIC_OR_DEPARTMENT_OTHER): Payer: 59

## 2022-12-25 ENCOUNTER — Encounter (HOSPITAL_BASED_OUTPATIENT_CLINIC_OR_DEPARTMENT_OTHER): Payer: Self-pay

## 2022-12-25 DIAGNOSIS — R000 Tachycardia, unspecified: Secondary | ICD-10-CM | POA: Insufficient documentation

## 2022-12-25 DIAGNOSIS — Z853 Personal history of malignant neoplasm of breast: Secondary | ICD-10-CM | POA: Diagnosis not present

## 2022-12-25 DIAGNOSIS — I130 Hypertensive heart and chronic kidney disease with heart failure and stage 1 through stage 4 chronic kidney disease, or unspecified chronic kidney disease: Secondary | ICD-10-CM | POA: Diagnosis not present

## 2022-12-25 DIAGNOSIS — J45909 Unspecified asthma, uncomplicated: Secondary | ICD-10-CM | POA: Insufficient documentation

## 2022-12-25 DIAGNOSIS — R002 Palpitations: Secondary | ICD-10-CM | POA: Diagnosis present

## 2022-12-25 DIAGNOSIS — R0602 Shortness of breath: Secondary | ICD-10-CM | POA: Insufficient documentation

## 2022-12-25 DIAGNOSIS — Z79899 Other long term (current) drug therapy: Secondary | ICD-10-CM | POA: Diagnosis not present

## 2022-12-25 DIAGNOSIS — N189 Chronic kidney disease, unspecified: Secondary | ICD-10-CM | POA: Insufficient documentation

## 2022-12-25 DIAGNOSIS — I5032 Chronic diastolic (congestive) heart failure: Secondary | ICD-10-CM | POA: Diagnosis not present

## 2022-12-25 DIAGNOSIS — Z20822 Contact with and (suspected) exposure to covid-19: Secondary | ICD-10-CM | POA: Diagnosis not present

## 2022-12-25 DIAGNOSIS — Z7951 Long term (current) use of inhaled steroids: Secondary | ICD-10-CM | POA: Insufficient documentation

## 2022-12-25 LAB — CBC
HCT: 42.4 % (ref 36.0–46.0)
Hemoglobin: 14.3 g/dL (ref 12.0–15.0)
MCH: 30 pg (ref 26.0–34.0)
MCHC: 33.7 g/dL (ref 30.0–36.0)
MCV: 88.9 fL (ref 80.0–100.0)
Platelets: 213 10*3/uL (ref 150–400)
RBC: 4.77 MIL/uL (ref 3.87–5.11)
RDW: 13.8 % (ref 11.5–15.5)
WBC: 6.6 10*3/uL (ref 4.0–10.5)
nRBC: 0 % (ref 0.0–0.2)

## 2022-12-25 LAB — RESP PANEL BY RT-PCR (RSV, FLU A&B, COVID)  RVPGX2
Influenza A by PCR: NEGATIVE
Influenza B by PCR: NEGATIVE
Resp Syncytial Virus by PCR: NEGATIVE
SARS Coronavirus 2 by RT PCR: NEGATIVE

## 2022-12-25 LAB — COMPREHENSIVE METABOLIC PANEL
ALT: 31 U/L (ref 0–44)
AST: 26 U/L (ref 15–41)
Albumin: 4.4 g/dL (ref 3.5–5.0)
Alkaline Phosphatase: 68 U/L (ref 38–126)
Anion gap: 8 (ref 5–15)
BUN: 11 mg/dL (ref 6–20)
CO2: 29 mmol/L (ref 22–32)
Calcium: 9.6 mg/dL (ref 8.9–10.3)
Chloride: 103 mmol/L (ref 98–111)
Creatinine, Ser: 0.86 mg/dL (ref 0.44–1.00)
GFR, Estimated: >60 mL/min (ref >60)
Glucose, Bld: 102 mg/dL — ABNORMAL HIGH (ref 70–99)
Potassium: 3.7 mmol/L (ref 3.5–5.1)
Sodium: 140 mmol/L (ref 135–145)
Total Bilirubin: 0.7 mg/dL (ref 0.3–1.2)
Total Protein: 8.1 g/dL (ref 6.5–8.1)

## 2022-12-25 LAB — BRAIN NATRIURETIC PEPTIDE: B Natriuretic Peptide: 24.1 pg/mL (ref 0.0–100.0)

## 2022-12-25 LAB — TROPONIN I (HIGH SENSITIVITY)
Troponin I (High Sensitivity): 24 ng/L — ABNORMAL HIGH (ref <18)
Troponin I (High Sensitivity): 24 ng/L — ABNORMAL HIGH (ref <18)

## 2022-12-25 MED ORDER — ACETAMINOPHEN 325 MG PO TABS
650.0000 mg | ORAL_TABLET | Freq: Once | ORAL | Status: AC
  Administered 2022-12-25: 650 mg via ORAL
  Filled 2022-12-25: qty 2

## 2022-12-25 MED ORDER — DILTIAZEM HCL 30 MG PO TABS
30.0000 mg | ORAL_TABLET | Freq: Once | ORAL | Status: AC
  Administered 2022-12-25: 30 mg via ORAL
  Filled 2022-12-25: qty 1

## 2022-12-25 MED ORDER — AMLODIPINE BESYLATE 5 MG PO TABS
5.0000 mg | ORAL_TABLET | Freq: Once | ORAL | Status: AC
  Administered 2022-12-25: 5 mg via ORAL
  Filled 2022-12-25: qty 1

## 2022-12-25 MED ORDER — DILTIAZEM HCL ER COATED BEADS 300 MG PO CP24
300.0000 mg | ORAL_CAPSULE | Freq: Once | ORAL | Status: DC
Start: 2022-12-25 — End: 2022-12-25

## 2022-12-25 NOTE — ED Notes (Signed)
X-ray at bedside

## 2022-12-25 NOTE — Progress Notes (Signed)
Pt's Peak Flow 330. With good effort.

## 2022-12-25 NOTE — ED Triage Notes (Signed)
States off and on since yesterday having palpations and fast heart rate.   Denies pain but states short of breath.   Hx of CHF

## 2022-12-25 NOTE — ED Provider Notes (Signed)
[place] [facility]ERGENC[facility] DE[facility]MENT AT [facility] Provider Note   CSN: [id number] Arrival date & time: 12/25/22  1026     History  Chief Complaint  Patient presents with   Tachycardia    Erica [name] is a 47 y.o. female.  The history is provided by the patient.  Patient with history of CHF, CAD, chronic kidney disease, breast cancer presents with tachycardia.  Patient reports waking up this morning and feeling her heart was racing.  No new chest pain.  Reports chronic shortness of breath that is unchanged.  No new orthopnea or dyspnea on exertion.  She uses BiPAP at night without issues No new lower extremity edema.  She has been compliant with all her her medications except for this morning She was seen in urgent care and sent for evaluation  She is now having a mild headache because they made her nervous Past Medical History:  Diagnosis Date   Allergy    Anxiety    Apnea, sleep 05/04/2014   AHI 31/hr now on CPAP at 12cm H2O   Arthritis    Asthma    Asthma without status asthmaticus 01/19/2016   Axillary hidradenitis suppurativa 08/01/2011   Blood transfusion without reported diagnosis    Breast cancer (HCC) 2010   a. locally advanced left breast carcinoma diagnosed in 2010 and treated with neoadjuvant chemotherapy with docetaxel and cyclophosphamide as well as paclitaxel. This was followed by radiation therapy which was completed in 2011.   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  -Med calendar 12/07/12 , 04/06/2013 , 04/18/2014,, 08/30/2014 , 11/08/2014  - PFT's 01/12/2013  1.73 (65%)  Ratio 73 and no better p B2, DLCO 86% - trial off    Depression with somatization 06/15/2015   Dyspnea on exertion    a. 01/2013 Lexi MV: EF 54%, no ischemia/infarct;  b. 05/2013 Echo: EF 60-65%, no rwma, Gr 2 DD;  b.    Essential hypertension 10/09/2014   Estimated Creatinine Clearance: 82.4 mL/min (by C-G formula based on SCr of 0.98 mg/dL).   Fatty liver    Gastroparesis    GERD (gastroesophageal reflux disease)    Hyperglycemia 04/13/2011   Hypertension    Hypertensive heart disease 09/26/2015   Hypertriglyceridemia without hypercholesterolemia 06/15/2015   Framingham risk score is 1%    Idiopathic intracranial hypertension 10/21/2014   Impaired glucose tolerance 04/13/2011   Insomnia secondary to anxiety 04/05/2015   Lymphadenitis, chronic    restricted LEFT extremity   Malignant neoplasm of upper-outer quadrant of left breast in female, estrogen receptor negative (HCC) 01/07/2013   Stage III, Invasive Ductal, s/p partial Left Mastectomy/Lumpectomy (baseball sized) with Lymph Node dissection, had Chemo and RadRx     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   OSA (obstructive sleep apnea) 05/04/2014   AHI 31/hr now on CPAP at 12cm H2O   Oxygen deficiency    Uses 02 in CPAP   Persistent proteinuria 03/31/2018  Personal history of chemotherapy 2010   Personal history of radiation therapy 2010   Resistant hypertension 09/17/2011       Sleep apnea    SVT (supraventricular tachycardia) 08/22/2020   Tibialis posterior tendinitis, left 07/15/2018   Injected in March 10, 2019   Upper airway cough syndrome 10/09/2014   Onset around 2012 with assoc pseudowheeze MRI 05/18/15 > Paranasal sinuses are clear - 11/17/2015 rec increase h1 to 2 hs to help with noct cough  - Allergy profile 11/17/2015 >  Eos 0.1 /  IgE  80 POS RAST  to grass, ragweed, cedar tree  - d/c acei 03/06/2018  - flare end of Jan  2020 in setting of ? Uri/ poorly controlled GERD - flare mid Oct 2020 with uri / assoc secondary gerd   Vitamin D deficiency disease 03/31/2018    Home Medications Prior to Admission medications   Medication Sig Start Date End Date Taking? Authorizing Provider  acetaZOLAMIDE (DIAMOX) 250 MG tablet Take 4 tablets (1,000 mg total) by mouth 3 (three) times daily. 07/19/19   Patel, Roxana Hires K, DO  albuterol (PROVENTIL) (2.5 MG/3ML) 0.083% nebulizer solution USE 1 VIAL VIA NEBULIZER EVERY 4 HOURS AND AS NEEDED FOR WHEEZING OR SHORTNESS OF BREATH 05/07/22   Cobb, Ruby Cola, NP  albuterol (VENTOLIN HFA) 108 (90 Base) MCG/ACT inhaler TAKE 2 PUFFS BY MOUTH EVERY 6 HOURS AS NEEDED FOR WHEEZE OR SHORTNESS OF BREATH 08/27/22   Cobb, Ruby Cola, NP  AMBULATORY NON FORMULARY MEDICATION Carpal Tunnel Wrist brace. Rt  Carpal Tunnel Syndrome G56.01  Disp 1 08/03/20   Rodolph Bong, MD  AMBULATORY NON FORMULARY MEDICATION Lace up ankle brace (ASO type)  Ankle sprain S93.491A  Disp 1 08/03/20   Rodolph Bong, MD  amitriptyline (ELAVIL) 100 MG tablet TAKE 1 TABLET BY MOUTH EVERYDAY AT BEDTIME 12/28/18   [name], MD  amLODipine (NORVASC) 10 MG tablet TAKE 1 TABLET BY MOUTH EVERY DAY 12/11/22   Chilton Si, MD  budesonide-formoterol [facility]) 160-4.5 MCG/ACT inhaler INHALE 2 PUFFS INTO LUNGS 2 TIMES DAILY AS DIRECTED 05/03/22   Nyoka Cowden, MD  chlorthalidone (HYGROTON) 25 MG tablet TAKE 1 TABLET (25 MG TOTAL) BY MOUTH DAILY. 06/27/22 06/22/23  Chilton Si, MD  Cholecalciferol (VITAMIN D) 50 MCG (2000 UT) tablet Take 2,000 Units by mouth 4 (four) times a week.    [provider]  cloNIDine (CATAPRES - DOSED IN MG/24 HR) 0.3 mg/24hr patch PLACE 2 PATCHES ONTO SKIN ONCE WEEKLY 06/10/22   Chilton Si, MD  clopidogrel (PLAVIX) 75 MG tablet TAKE 1 TABLET BY MOUTH EVERY DAY 05/07/22   Chilton Si, MD  clotrimazole (MYCELEX) 10 MG troche TAKE 1 TABLET BY MOUTH 5 TIMES DAILY. Patient  taking differently: Take 10 mg by mouth 5 (five) times daily as needed (thrush). 07/30/22   Nyoka Cowden, MD  dexlansoprazole (DEXILANT) 60 MG capsule Take 1 capsule (60 mg total) by mouth daily. 02/10/20   Pyrtle, Carie Caddy, MD  dextromethorphan-guaiFENesin (MUCINEX DM) 30-600 MG 12hr tablet Take 2 tablets by mouth 2 (two) times daily.    [provider]  diltiazem (CARDIZEM CD) 300 MG 24 hr capsule Take 1 capsule (300 mg total) by mouth daily. 10/02/22   Alver Sorrow, NP  doxazosin (CARDURA) 8 MG tablet TAKE 2 TABLETS BY MOUTH EVERY DAY AT BEDTIME 06/27/22   Chilton Si, MD  DULoxetine (CYMBALTA) 30 MG capsule TAKE 1 CAPSULE (30 MG TOTAL) BY MOUTH DAILY. WITH 60MG  FOR  TOTAL OF 90 MG Patient taking differently: Take 30 mg by mouth See admin instructions. With 60mg  for total of 90 mg 04/05/20   Nita Sickle K, DO  DULoxetine (CYMBALTA) 60 MG capsule TAKE 1 CAPSULE (60 MG TOTAL) BY MOUTH DAILY. FOR TOTAL OF 90 MG Patient taking differently: Take 60 mg by mouth See admin instructions. Take with 30 mg for a total of 90 mg in the morning 04/05/20   Nita Sickle K, DO  ENTRESTO 97-103 MG TAKE 1 TABLET BY MOUTH TWICE A DAY 06/10/22   Chilton Si, MD  EPINEPHRINE 0.3 mg/0.3 mL IJ SOAJ injection INJECT 0.3 MLS INTO THE MUSCLE ONCE AS NEEDED FOR ANAPHYLAXIS 12/10/16   [name], MD  Evolocumab (REPATHA SURECLICK) 140 MG/ML SOAJ Inject 140 mg into the skin every 14 (fourteen) days. 11/11/22   Alver Sorrow, NP  ezetimibe (ZETIA) 10 MG tablet TAKE 1 TABLET BY MOUTH EVERY DAY 05/07/22   Chilton Si, MD  famotidine (PEPCID) 20 MG tablet Take 1-2 tablets (20-40 mg total) by mouth at bedtime. Patient taking differently: Take 20 mg by mouth 4 (four) times daily -  before meals and at bedtime. 02/10/20   Pyrtle, Carie Caddy, MD  gabapentin (NEURONTIN) 300 MG capsule Take 1 capsule (300 mg total) by mouth 3 (three) times daily. One three times daily Patient taking differently: Take 300 mg by  mouth 3 (three) times daily. 11/23/19   Rodolph Bong, MD  hydrALAZINE (APRESOLINE) 50 MG tablet TAKE 1 TABLET BY MOUTH THREE TIMES A DAY 11/01/22   Chilton Si, MD  lidocaine-prilocaine (EMLA) cream Apply 1 application topically every 28 (twenty-eight) days. Apply 1 application to skin ever 28 days 02/11/17   [provider]  metoCLOPramide (REGLAN) 10 MG tablet TAKE 1 TABLET BY MOUTH 4 TIMES DAILY. Patient taking differently: Take 10 mg by mouth 4 (four) times daily -  before meals and at bedtime. 08/31/20   Pyrtle, Carie Caddy, MD  metolazone (ZAROXOLYN) 2.5 MG tablet TAKE 1 TABLET BY MOUTH ONE TIME PER WEEK 12/05/22   Alver Sorrow, NP  minoxidil (LONITEN) 2.5 MG tablet TAKE 2 TABLETS (5 MG TOTAL) BY MOUTH 2 (TWO) TIMES DAILY 09/05/22   Chilton Si, MD  montelukast (SINGULAIR) 10 MG tablet TAKE 1 TABLET BY MOUTH EVERYDAY AT BEDTIME 03/26/22   Nyoka Cowden, MD  Nebivolol HCl 20 MG TABS TAKE 2 TABLETS BY MOUTH EVERY DAY 03/26/22   Chilton Si, MD  nitroGLYCERIN (NITROSTAT) 0.4 MG SL tablet PLACE 1 TABLET UNDER THE TONGUE EVERY 5 MINUTES AS NEEDED FOR CHEST PAIN. 05/07/22   Chilton Si, MD  oxymetazoline [facility] SINUS-MAX FULL FORCE) 0.05 % nasal spray Place 1 spray into both nostrils 2 (two) times daily.    [provider]  Potassium Chloride ER 20 MEQ TBCR TAKE 2 TABLETS BY MOUTH TWICE A DAY 08/05/22   Chilton Si, MD  predniSONE (DELTASONE) 10 MG tablet 4 tabs for 2 days, then 3 tabs for 2 days, 2 tabs for 2 days, then 1 tab for 2 days, then stop 03/19/22   Glenford Bayley, NP  primidone (MYSOLINE) 50 MG tablet Take 0.5 tablets (25 mg total) by mouth at bedtime. 07/19/19   Patel, Donika K, DO  promethazine (PHENERGAN) 25 MG tablet TAKE 1 TABLET (25 MG TOTAL) BY MOUTH 3 (THREE) TIMES DAILY AS NEEDED FOR NAUSEA OR VOMITING. 09/11/20   Pyrtle, Carie Caddy, MD  promethazine-dextromethorphan (PROMETHAZINE-DM) 6.25-15 MG/5ML syrup TAKE 5 MLS BY MOUTH  4 TIMES A DAY AS  NEEDED FOR COUGH 07/19/21   Nyoka Cowden, MD  ranolazine (RANEXA) 1000 MG SR tablet Take 1 tablet (1,000 mg total) by mouth 2 (two) times daily. 10/22/22   Chilton Si, MD  Respiratory Therapy Supplies (FLUTTER) DEVI Use as directed 03/24/15   Nyoka Cowden, MD  rosuvastatin (CRESTOR) 40 MG tablet Take 1 tablet (40 mg total) by mouth daily. 01/14/22   Chilton Si, MD  Spacer/Aero-Holding Chambers (AEROCHAMBER MV) inhaler Use as instructed 03/06/18   Nyoka Cowden, MD  spironolactone (ALDACTONE) 100 MG tablet TAKE 1 TABLET BY MOUTH EVERY DAY 05/07/22   Chilton Si, MD  torsemide (DEMADEX) 20 MG tablet TAKE 2 TABLETS BY MOUTH 2 TIMES DAILY. 03/26/22   Chilton Si, MD      Allergies    Aspirin, Bactrim [sulfamethoxazole-trimethoprim], Doxycycline, Kiwi extract, Metoprolol, Penicillins, and Strawberry extract    Review of Systems   Review of Systems  Constitutional:  Negative for fever.  Respiratory:  Positive for shortness of breath.   Cardiovascular:  Positive for palpitations. Negative for chest pain and leg swelling.    Physical Exam Updated Vital Signs BP (!) 198/104   Pulse 97   Temp 99.1 F (37.3 C) (Oral)   Resp 20   Ht 1.676 m (5\' 6" )   Wt 95.7 kg   LMP  (LMP Unknown)   SpO2 95%   BMI 34.06 kg/m  Physical Exam CONSTITUTIONAL: Well developed/well nourished, mildly anxious HEAD: Normocephalic/atraumatic EYES: EOMI/PERRL ENMT: Mucous membranes moist NECK: supple no meningeal signs, no JVD CV: S1/S2 noted, no murmurs/rubs/gallops noted LUNGS: Lungs are clear to auscultation bilaterally, no apparent distress ABDOMEN: soft, nontender, no rebound or guarding, bowel sounds noted throughout abdomen GU:no cva tenderness NEURO: Pt is awake/alert/appropriate, moves all extremitiesx4.  No facial droop.   EXTREMITIES: pulses normal/equal, full ROM, no lower extremity edema SKIN: warm, color normal PSYCH: no abnormalities of mood noted, alert and oriented  to situation  ED Results / Procedures / Treatments   Labs (all labs ordered are listed, but only abnormal results are displayed) Labs Reviewed  COMPREHENSIVE METABOLIC PANEL - Abnormal; Notable for the following components:      Result Value   Glucose, Bld 102 (*)    All other components within normal limits  TROPONIN I (HIGH SENSITIVITY) - Abnormal; Notable for the following components:   Troponin I (High Sensitivity) 24 (*)    All other components within normal limits  TROPONIN I (HIGH SENSITIVITY) - Abnormal; Notable for the following components:   Troponin I (High Sensitivity) 24 (*)    All other components within normal limits  RESP PANEL BY RT-PCR (RSV, FLU A&B, COVID)  [id number]  CBC  BRAIN NATRIURETIC PEPTIDE    EKG ED ECG REPORT   Date: 12/25/2022 1035am  Rate: 96  Rhythm: normal sinus rhythm  QRS Axis: normal  Intervals: normal  ST/T Wave abnormalities: nonspecific ST changes  Conduction Disutrbances:none  Narrative Interpretation:   Old EKG Reviewed: unchanged  I have personally reviewed the EKG tracing and agree with the computerized printout as noted.   Radiology DG Chest Port 1 View  Result Date: 12/25/2022 CLINICAL DATA:  Shortness of breath, tachycardia EXAM: PORTABLE CHEST 1 VIEW COMPARISON:  Chest radiograph 05/19/2019 FINDINGS: Shunt tubing is again seen coursing over the chest. The cardiomediastinal silhouette is stable, with unchanged borderline cardiomegaly. There is no focal consolidation or pulmonary edema. There is no pleural effusion or pneumothorax There is no acute osseous  abnormality. IMPRESSION: Unchanged borderline cardiomegaly. No radiographic evidence of acute cardiopulmonary process. Electronically Signed   By: Lesia Hausen M.D.   On: 12/25/2022 12:41    Procedures Procedures    Medications Ordered in ED Medications  amLODipine (NORVASC) tablet 5 mg (5 mg Oral Given 12/25/22 1136)  acetaminophen (TYLENOL) tablet 650 mg (650 mg Oral Given  12/25/22 1136)  diltiazem (CARDIZEM) tablet 30 mg (30 mg Oral Given 12/25/22 1218)    ED Course/ Medical Decision Making/ A&P Clinical Course as of 12/25/22 1424  Wed Dec 25, 2022  1126 Patient presents from urgent care.  Her friend encouraged her to go because she was reporting elevated heart rate.  Patient flatly denies chest pain or shortness of breath on my evaluation, but urgent care notes reveal she reported shortness of breath and chest pressure  No acute EKG changes.  Will check labs, x-ray and reassess.  Will also start some of her home medications [DW]  1242 Troponin I (High Sensitivity)(!): 24 Troponin mildly elevated, no recent to compare [DW]  1242 Unable to give aspirin due to allergy [DW]  1322 Patient resting comfortably, no acute distress, watching TV, continues to deny chest pain or shortness of breath [DW]  1423 Troponin that was my only elevated as remained flat and no acute changes.  Overall patient is well-appearing.  She continues to deny chest pain or shortness of breath.  He is in no acute distress, watching television.  She is requesting discharge home  My suspicion for ACS/PE/dissection is low.  Patient reports long history of difficult to control blood pressure and admits to not taking all of her medicines earlier in the day.  She will follow with her cardiologist  We discussed strict return precautions [DW]    Clinical Course User Index [DW] Zadie Rhine, MD                                 Medical Decision Making Amount and/or Complexity of Data Reviewed Labs: ordered. Decision-making details documented in ED Course. Radiology: ordered.  Risk OTC drugs. Prescription drug management.   This patient presents to the ED for concern of palpitations, this involves an extensive number of treatment options, and is a complaint that carries with it a high risk of complications and morbidity.  The differential diagnosis includes but is not limited to SVT,  atrial fibrillation, atrial flutter, V. tach, sinus tachycardia  Comorbidities that complicate the patient evaluation: Patient's presentation is complicated by their history of CHF and CAD  Social Determinants of Health: Patient's  multiple ER visits   increases the complexity of managing their presentation  Additional history obtained: Additional history obtained from  friend Records reviewed Care Everywhere/External Records  Lab Tests: I Ordered, and personally interpreted labs.  The pertinent results include: Mildly elevated troponin  Imaging Studies ordered: I ordered imaging studies including X-ray chest   I independently visualized and interpreted imaging which showed no acute findings I agree with the radiologist interpretation  Cardiac Monitoring: The patient was maintained on a cardiac monitor.  I personally viewed and interpreted the cardiac monitor which showed an underlying rhythm of:  sinus rhythm   Reevaluation: After the interventions noted above, I reevaluated the patient and found that they have :improved  Complexity of problems addressed: Patient's presentation is most consistent with  acute presentation with potential threat to life or bodily function  Disposition: After consideration of the diagnostic  results and the patient's response to treatment,  I feel that the patent would benefit from discharge   .           Final Clinical Impression(s) / ED Diagnoses Final diagnoses:  Palpitations    Rx / DC Orders ED Discharge Orders     None         Zadie Rhine, MD 12/25/22 1425

## 2022-12-26 ENCOUNTER — Other Ambulatory Visit (HOSPITAL_BASED_OUTPATIENT_CLINIC_OR_DEPARTMENT_OTHER): Payer: Self-pay | Admitting: Cardiovascular Disease

## 2023-01-01 ENCOUNTER — Ambulatory Visit: Payer: Medicare Other | Admitting: Internal Medicine

## 2023-01-03 ENCOUNTER — Encounter: Payer: Self-pay | Admitting: Internal Medicine

## 2023-01-03 ENCOUNTER — Ambulatory Visit: Payer: 59 | Admitting: Internal Medicine

## 2023-01-03 ENCOUNTER — Ambulatory Visit (HOSPITAL_BASED_OUTPATIENT_CLINIC_OR_DEPARTMENT_OTHER): Payer: 59 | Admitting: Family

## 2023-01-03 VITALS — BP 138/78 | HR 67 | Temp 98.5°F | Ht 66.0 in | Wt 218.8 lb

## 2023-01-03 DIAGNOSIS — Z23 Encounter for immunization: Secondary | ICD-10-CM | POA: Diagnosis not present

## 2023-01-03 DIAGNOSIS — J45991 Cough variant asthma: Secondary | ICD-10-CM | POA: Diagnosis not present

## 2023-01-03 DIAGNOSIS — R058 Other specified cough: Secondary | ICD-10-CM | POA: Diagnosis not present

## 2023-01-03 MED ORDER — PROMETHAZINE-DM 6.25-15 MG/5ML PO SYRP: 5.0000 mL | ORAL_SOLUTION | Freq: Four times a day (QID) | ORAL | 2 refills | Status: AC | PRN

## 2023-01-03 NOTE — Patient Instructions (Addendum)
No change in medications   Please schedule a follow up visit in 12  months but call sooner if needed  

## 2023-01-03 NOTE — Progress Notes (Unsigned)
Subjective:   Patient ID: Erica [name], female     DOB: [birth year]    MRN: [id number]    Brief patient profile:  26 yobf  never smoker with asthma all her life much worse since around 07/2010 referred by Dr Darnelle Catalan to pulmonary clinic in Sept 2012 Hx of Breast Cancer 05/2008    History of Present Illness  12/18/2010 Initial pulmonary office eval on acei and  advair cc dtc asthma x 4 months but baseline = excess saba use "even when better" at least twice daily despite daily advair, worse at hs in fact  used saba 3 x last night.  On chemo /RT for breast ca but last rx was Jan 2012 well before asthma worse.  To er multiple times >  no better with singular added x 3 months. rec Stop Lisinopril Dulera 200 Take 2 puffs first thing in am and then another 2 puffs about 12 hours later.  Work on inhaler technique  Nexium 40 mg   Take 30-60 min before first meal of the day and Pepcid 20 mg one bedtime until return Only use your albuterol as a rescue medication to be used if you can't catch your breath by resting or doing a relaxed purse lip breathing pattern. The less you use it, the better it will work when you need it.  Prednisone 10 mg take  4 each am x 2 days,   2 each am x 2 days,  1 each am x2days and stop    12/31/2021  f/u ov/Nichalos Brenton re: asthma   maint on symbicort 160 Take 2 puffs first thing in am and then another 2 puffs about 12 hours later. Promethazine dm and gabapentin 300 tid        01/03/2023  f/u ov/Dynasia Kercheval re: asthma/ chronic cough  maint on Symbicort 160 2bid   Chief Complaint  Patient presents with   Follow-up    Doing well  Dyspnea:  steps and bicycle x 30 min on 2lpm but limited by HR 155 and has to stop - always takes saba prior Cough: on entresto  daytime / dry cough better with gabapentin and prn phenergan  dm   Sleeping: flat bed/ pillows  resp cc  SABA use: before ex occ noct cough/ gasping - has neb not needing  02: 2lpm and bipap / during ex only     No obvious day  to day or daytime variability or assoc excess/ purulent sputum or mucus plugs or hemoptysis or cp or chest tightness, subjective wheeze or overt sinus or hb symptoms.    Also denies any obvious fluctuation of symptoms with weather or environmental changes or other aggravating or alleviating factors except as outlined above   No unusual exposure hx or h/o childhood pna or knowledge of premature birth.  Current Allergies, Complete Past Medical History, Past Surgical History, Family History, and Social History were reviewed in [employer] record.  ROS  The following are not active complaints unless bolded Hoarseness, sore throat, dysphagia, dental problems, itching, sneezing,  nasal congestion or discharge of excess mucus or purulent secretions, ear ache,   fever, chills, sweats, unintended wt loss or wt gain, classically pleuritic or exertional cp,  orthopnea pnd or arm/hand swelling  or leg swelling, presyncope, palpitations, abdominal pain, anorexia, nausea, vomiting, diarrhea  or change in bowel habits or change in bladder habits, change in stools or change in urine, dysuria, hematuria,  rash, arthralgias, visual complaints, headache, numbness, weakness or  ataxia or problems with walking or coordination,  change in mood or  memory.        Current Meds  Medication Sig   acetaZOLAMIDE (DIAMOX) 250 MG tablet Take 4 tablets (1,000 mg total) by mouth 3 (three) times daily.   albuterol (PROVENTIL) (2.5 MG/3ML) 0.083% nebulizer solution USE 1 VIAL VIA NEBULIZER EVERY 4 HOURS AND AS NEEDED FOR WHEEZING OR SHORTNESS OF BREATH   albuterol (VENTOLIN HFA) 108 (90 Base) MCG/ACT inhaler TAKE 2 PUFFS BY MOUTH EVERY 6 HOURS AS NEEDED FOR WHEEZE OR SHORTNESS OF BREATH   AMBULATORY NON FORMULARY MEDICATION Carpal Tunnel Wrist brace. Rt  Carpal Tunnel Syndrome G56.01  Disp 1   AMBULATORY NON FORMULARY MEDICATION Lace up ankle brace (ASO type)  Ankle sprain S93.491A  Disp 1    amitriptyline (ELAVIL) 100 MG tablet TAKE 1 TABLET BY MOUTH EVERYDAY AT BEDTIME   amLODipine (NORVASC) 10 MG tablet TAKE 1 TABLET BY MOUTH EVERY DAY   budesonide-formoterol (SYMBICORT) 160-4.5 MCG/ACT inhaler INHALE 2 PUFFS INTO LUNGS 2 TIMES DAILY AS DIRECTED   chlorthalidone (HYGROTON) 25 MG tablet TAKE 1 TABLET (25 MG TOTAL) BY MOUTH DAILY.   Cholecalciferol (VITAMIN D) 50 MCG (2000 UT) tablet Take 2,000 Units by mouth 4 (four) times a week.   cloNIDine (CATAPRES - DOSED IN MG/24 HR) 0.3 mg/24hr patch PLACE 2 PATCHES ONTO SKIN ONCE WEEKLY   clopidogrel (PLAVIX) 75 MG tablet TAKE 1 TABLET BY MOUTH EVERY DAY   clotrimazole (MYCELEX) 10 MG troche TAKE 1 TABLET BY MOUTH 5 TIMES DAILY. (Patient taking differently: Take 10 mg by mouth 5 (five) times daily as needed (thrush).)   dexlansoprazole (DEXILANT) 60 MG capsule Take 1 capsule (60 mg total) by mouth daily.   dextromethorphan-guaiFENesin (MUCINEX DM) 30-600 MG 12hr tablet Take 2 tablets by mouth 2 (two) times daily.   diltiazem (CARDIZEM CD) 300 MG 24 hr capsule Take 1 capsule (300 mg total) by mouth daily.   doxazosin (CARDURA) 8 MG tablet TAKE 2 TABLETS BY MOUTH EVERY DAY AT BEDTIME   DULoxetine (CYMBALTA) 30 MG capsule TAKE 1 CAPSULE (30 MG TOTAL) BY MOUTH DAILY. WITH 60MG  FOR TOTAL OF 90 MG (Patient taking differently: Take 30 mg by mouth See admin instructions. With 60mg  for total of 90 mg)   DULoxetine (CYMBALTA) 60 MG capsule TAKE 1 CAPSULE (60 MG TOTAL) BY MOUTH DAILY. FOR TOTAL OF 90 MG (Patient taking differently: Take 60 mg by mouth See admin instructions. Take with 30 mg for a total of 90 mg in the morning)   ENTRESTO 97-103 MG TAKE 1 TABLET BY MOUTH TWICE A DAY   EPINEPHRINE 0.3 mg/0.3 mL IJ SOAJ injection INJECT 0.3 MLS INTO THE MUSCLE ONCE AS NEEDED FOR ANAPHYLAXIS   Evolocumab (REPATHA SURECLICK) 140 MG/ML SOAJ Inject 140 mg into the skin every 14 (fourteen) days.   ezetimibe (ZETIA) 10 MG tablet TAKE 1 TABLET BY MOUTH EVERY DAY    famotidine (PEPCID) 20 MG tablet Take 1-2 tablets (20-40 mg total) by mouth at bedtime. (Patient taking differently: Take 20 mg by mouth 4 (four) times daily -  before meals and at bedtime.)   gabapentin (NEURONTIN) 300 MG capsule Take 1 capsule (300 mg total) by mouth 3 (three) times daily. One three times daily (Patient taking differently: Take 300 mg by mouth 3 (three) times daily.)   hydrALAZINE (APRESOLINE) 50 MG tablet TAKE 1 TABLET BY MOUTH THREE TIMES A DAY   lidocaine-prilocaine (EMLA) cream Apply 1 application topically every 28 (  twenty-eight) days. Apply 1 application to skin ever 28 days   metoCLOPramide (REGLAN) 10 MG tablet TAKE 1 TABLET BY MOUTH 4 TIMES DAILY. (Patient taking differently: Take 10 mg by mouth 4 (four) times daily -  before meals and at bedtime.)   metolazone (ZAROXOLYN) 2.5 MG tablet TAKE 1 TABLET BY MOUTH ONE TIME PER WEEK   minoxidil (LONITEN) 2.5 MG tablet TAKE 2 TABLETS (5 MG TOTAL) BY MOUTH 2 (TWO) TIMES DAILY   montelukast (SINGULAIR) 10 MG tablet TAKE 1 TABLET BY MOUTH EVERYDAY AT BEDTIME   Nebivolol HCl 20 MG TABS TAKE 2 TABLETS BY MOUTH EVERY DAY   nitroGLYCERIN (NITROSTAT) 0.4 MG SL tablet PLACE 1 TABLET UNDER THE TONGUE EVERY 5 MINUTES AS NEEDED FOR CHEST PAIN.   oxymetazoline (MUCINEX SINUS-MAX FULL FORCE) 0.05 % nasal spray Place 1 spray into both nostrils 2 (two) times daily.   Potassium Chloride ER 20 MEQ TBCR TAKE 2 TABLETS BY MOUTH TWICE A DAY   predniSONE (DELTASONE) 10 MG tablet 4 tabs for 2 days, then 3 tabs for 2 days, 2 tabs for 2 days, then 1 tab for 2 days, then stop   primidone (MYSOLINE) 50 MG tablet Take 0.5 tablets (25 mg total) by mouth at bedtime.   promethazine (PHENERGAN) 25 MG tablet TAKE 1 TABLET (25 MG TOTAL) BY MOUTH 3 (THREE) TIMES DAILY AS NEEDED FOR NAUSEA OR VOMITING.   ranolazine (RANEXA) 1000 MG SR tablet Take 1 tablet (1,000 mg total) by mouth 2 (two) times daily.   Respiratory Therapy Supplies (FLUTTER) DEVI Use as directed    rosuvastatin (CRESTOR) 40 MG tablet Take 1 tablet (40 mg total) by mouth daily.   Spacer/Aero-Holding Chambers (AEROCHAMBER MV) inhaler Use as instructed   spironolactone (ALDACTONE) 100 MG tablet TAKE 1 TABLET BY MOUTH EVERY DAY   torsemide (DEMADEX) 20 MG tablet TAKE 2 TABLETS BY MOUTH TWICE A DAY   [DISCONTINUED] promethazine-dextromethorphan (PROMETHAZINE-DM) 6.25-15 MG/5ML syrup TAKE 5 MLS BY MOUTH 4 TIMES A DAY AS NEEDED FOR COUGH               Objective:  Physical Exam   wts   01/03/2023   218  12/31/2021   217   06/25/2021   220 11/03/2019     204  05/19/2019   207  01/25/2019   202 Wt 213  12/18/10 > 01/03/2011  214 > 01/31/2011  206 >  09/11/2012 209 >208 6/19 > 210 09/25/2012 > 11/20/2012 211 >208 12/07/12> 01/12/2013 209 > 02/09/2013 210 >207 04/06/2013 > 07/14/2013  213>212 07/28/2013 >204 01/26/2014> 04/04/2014 206  >205 04/18/2014 >  07/18/2014 201 >  08/01/14 201 >198 08/30/2014 > 10/05/14  195 > 10/19/2014 192 > 191 11/08/2014 > 02/10/2015  198 > 03/24/2015   198 > 06/29/2015   201 > 08/31/2015  204 > 11/17/2015  197 > 12/15/2015  188 > 03/22/2016   188 > 07/07/2017  198 > 03/06/2018  199  > 06/05/2018  200    Vital signs reviewed  01/03/2023  - Note at rest 02 sats  100% on RA   General appearance:    amb moderately obese (by BMI)  bf nad    HEENT : Oropharynx  clear       NECK :  without  apparent JVD/ palpable Nodes/TM    LUNGS: no acc muscle use,  Nl contour chest which is clear to A and P bilaterally without cough on insp or exp maneuvers   CV:  RRR  no s3 or murmur or increase in P2, and no edema   ABD: obese  soft and nontender    MS:  Nl gait/ ext warm without deformities Or obvious joint restrictions  calf tenderness, cyanosis or clubbing    SKIN: warm and dry without lesions    NEURO:  alert, approp, nl sensorium with  no motor or cerebellar deficits apparent.        I personally reviewed images and agree with radiology impression as follows:  CXR:   portable 12/25/22 Unchanged  borderline cardiomegaly. No radiographic evidence of acute cardiopulmonary process.

## 2023-01-04 ENCOUNTER — Telehealth: Payer: Self-pay | Admitting: Adult Health

## 2023-01-04 ENCOUNTER — Encounter: Payer: Self-pay | Admitting: Internal Medicine

## 2023-01-04 NOTE — Assessment & Plan Note (Signed)
Onset reported in childhood  - PFT's 01/31/2011 FEV1  1.76 (60% ) ratio 68 and no better p B2,  DLCO 70% -med review 09/25/2012 > Dulera drug assistance  paperwork given    - HFA 90% 11/20/2012 .  -Med calendar 12/07/12 , 04/06/2013 , 04/18/2014,, 08/30/2014 , 11/08/2014  - PFT's 01/12/2013  1.73 (65%)  Ratio 73 and no better p B2, DLCO 86% - trial off singulair 02/08/13 >>>did not stop  - 04/05/2014  trial of dulera 100 instead of 200 > worse  -04/18/2014 increase Dulera 200  - 08/01/2014 p extensive coaching HFA effectiveness =    90% - 08/01/2014  Walked RA x 2laps @ 185 ft each stopped due to cough > sob/ no desat moderate pace  - spirometry 08/01/2014  FEV1  1.87 (70%) ratio 78  fef 25-75 53%  - spirometry 02/10/2015  flattening of top portion of f/v loop only    - 02/10/2015   Walked RA  2 laps @ 185 ft each stopped due to cough > sob/ slow pace   -  NO 03/24/2015   =  16 - Methacholine challenge 04/04/2015   POS asthma = wheeze/sob but no coughing fits > added qvar 80 2bid 06/29/2015  - Allergy profile 11/17/2015 >  Eos 0.1 /  IgE  80 POS RAST  to grass, ragweed, cedar tree    - Spirometry 08/31/2015  FEV1 0.75 (28%)  Ratio 68  - FENO 11/17/2015  =  8  on symb 160 2bid and qvar 80 2bid  - FENO 03/22/2016  = 36 on symb 160/qvar 80 though not able to verify being used as directed   All goals of chronic asthma control met including optimal function and elimination of symptoms with minimal need for rescue therapy.  Contingencies discussed in full including contacting this office immediately if not controlling the symptoms using the rule of two's.

## 2023-01-04 NOTE — Telephone Encounter (Signed)
Patient is aware of timeframe adjustment regarding upcoming appointment, patient has stated they run a few minutes behind and this has also been added in the notes as well

## 2023-01-04 NOTE — Assessment & Plan Note (Signed)
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 Uri/ poorly controlled GERD - flare mid Oct 2020 with uri / assoc secondary gerd - 11/03/2019 trial of gabapentin 100 qid > increased to 300 tid as of 12/31/2021 and no longer opioid dep on phenergan dm prn   Absence noct cough strongly supportive of UACS and likely Entresto now contributing to daytime cough but relatively well controlled with gabapentin and prn phenergan dm   No change in rx needed  F/u q 12 m, sooner prn           Each maintenance medication was reviewed in detail including emphasizing most importantly the difference between maintenance and prns and under what circumstances the prns are to be triggered using an action plan format where appropriate.  Total time for H and P, chart review, counseling, reviewing hfa/neb device(s) and generating customized AVS unique to this office visit / same day charting =  25 min

## 2023-01-07 ENCOUNTER — Inpatient Hospital Stay: Payer: 59 | Attending: Adult Health

## 2023-01-07 ENCOUNTER — Encounter: Payer: Self-pay | Admitting: Oncology

## 2023-01-07 VITALS — BP 134/72 | HR 67 | Temp 98.2°F | Resp 18

## 2023-01-07 DIAGNOSIS — Z5111 Encounter for antineoplastic chemotherapy: Secondary | ICD-10-CM | POA: Diagnosis present

## 2023-01-07 DIAGNOSIS — C50412 Malignant neoplasm of upper-outer quadrant of left female breast: Secondary | ICD-10-CM | POA: Diagnosis present

## 2023-01-07 DIAGNOSIS — Z171 Estrogen receptor negative status [ER-]: Secondary | ICD-10-CM | POA: Insufficient documentation

## 2023-01-07 DIAGNOSIS — Z923 Personal history of irradiation: Secondary | ICD-10-CM | POA: Diagnosis not present

## 2023-01-07 MED ORDER — GOSERELIN ACETATE 3.6 MG ~~LOC~~ IMPL
3.6000 mg | DRUG_IMPLANT | SUBCUTANEOUS | Status: DC
  Administered 2023-01-07: 3.6 mg via SUBCUTANEOUS
  Filled 2023-01-07: qty 3.6

## 2023-01-09 ENCOUNTER — Ambulatory Visit (INDEPENDENT_AMBULATORY_CARE_PROVIDER_SITE_OTHER): Payer: 59 | Admitting: Family

## 2023-01-09 ENCOUNTER — Encounter (HOSPITAL_BASED_OUTPATIENT_CLINIC_OR_DEPARTMENT_OTHER): Payer: Self-pay | Admitting: Family

## 2023-01-09 ENCOUNTER — Other Ambulatory Visit (HOSPITAL_BASED_OUTPATIENT_CLINIC_OR_DEPARTMENT_OTHER): Payer: 59

## 2023-01-09 VITALS — BP 162/88 | HR 68 | Ht 66.0 in | Wt 219.1 lb

## 2023-01-09 DIAGNOSIS — I471 Supraventricular tachycardia, unspecified: Secondary | ICD-10-CM

## 2023-01-09 DIAGNOSIS — I701 Atherosclerosis of renal artery: Secondary | ICD-10-CM | POA: Diagnosis not present

## 2023-01-09 DIAGNOSIS — I25118 Atherosclerotic heart disease of native coronary artery with other forms of angina pectoris: Secondary | ICD-10-CM | POA: Diagnosis not present

## 2023-01-09 DIAGNOSIS — I5032 Chronic diastolic (congestive) heart failure: Secondary | ICD-10-CM

## 2023-01-09 DIAGNOSIS — R5383 Other fatigue: Secondary | ICD-10-CM

## 2023-01-09 DIAGNOSIS — G4733 Obstructive sleep apnea (adult) (pediatric): Secondary | ICD-10-CM

## 2023-01-09 MED ORDER — DILTIAZEM HCL ER COATED BEADS 360 MG PO CP24: 360.0000 mg | ORAL_CAPSULE | Freq: Every day | ORAL | 1 refills | Status: AC

## 2023-01-09 NOTE — Patient Instructions (Addendum)
Medication Instructions:  Your physician has recommended you make the following change in your medication:   INCREASE Diltiazem to 360mg  daily  *If you need a refill on your cardiac medications before your next appointment, please call your pharmacy*  Labs: Your physician recommends that you return for lab work today: B12, folate, vitamin D  Testing Your physician has requested that you have an ankle brachial index (ABI). During this test an ultrasound and blood pressure cuff are used to evaluate the arteries that supply the arms and legs with blood. Allow thirty minutes for this exam. There are no restrictions or special instructions.   Your physician has recommended that you wear a Zio monitor.   This monitor is a medical device that records the heart's electrical activity. Doctors most often use these monitors to diagnose arrhythmias. Arrhythmias are problems with the speed or rhythm of the heartbeat. The monitor is a small device applied to your chest. You can wear one while you do your normal daily activities. While wearing this monitor if you have any symptoms to push the button and record what you felt. Once you have worn this monitor for the period of time provider prescribed (Usually 14 days), you will return the monitor device in the postage paid box. Once it is returned they will download the data collected and provide [place] with a report which the provider will then review and we will call you with those results. Important tips:  Avoid showering during the first 24 hours of wearing the monitor. Avoid excessive sweating to help maximize wear time. Do not submerge the device, no hot tubs, and no swimming pools. Keep any lotions or oils away from the patch. After 24 hours you may shower with the patch on. Take brief showers with your back facing the shower head.  Do not remove patch once it has been placed because that will interrupt data and decrease adhesive wear time. Push the button  when you have any symptoms and write down what you were feeling. Once you have completed wearing your monitor, remove and place into box which has postage paid and place in your outgoing mailbox.  If for some reason you have misplaced your box then call our office and we can provide another box and/or mail it off for you.  Follow-Up: At [facility], you and your health needs are our priority.  As part of our continuing mission to provide you with exceptional heart care, we have created designated Provider Care Teams.  These Care Teams include your primary Cardiologist (physician) and Advanced Practice Providers (APPs -  Physician Assistants and Nurse Practitioners) who all work together to provide you with the care you need, when you need it.  We recommend signing up for the patient portal called "MyChart".  Sign up information is provided on this After Visit Summary.  MyChart is used to connect with patients for Virtual Visits (Telemedicine).  Patients are able to view lab/test results, encounter notes, upcoming appointments, etc.  Non-urgent messages can be sent to your provider as well.   To learn more about what you can do with MyChart, go to [email or web address].    Your next appointment:   3-4 month(s)  Provider:   Chilton Si, MD or Gillian Shields, NP

## 2023-01-09 NOTE — Progress Notes (Addendum)
Cardiology Office Note:  .   Date:  01/09/2023  ID:  Erica [name], DOB 12-05-1975, MRN [id number] PCP: Patient, No Pcp Per  [facility] Providers Cardiologist:  Chilton Si, MD    History of Present Illness: .   Erica [name] is a 47 y.o. female with a hx of resistant hypertension, chronic diastolic heart failure, breast cancer s/p chemo and XRT, morbid obesity, OSA on BIPAP, renal artery stenosis, CAD, HLD.   She has history of hypertension dating back to treatment of breast cancer in 2010. She has established with Dr. Duke Salvia in the hypertension clinic. Previous echocardiogram 03/2018 LVEF 45-50%, grade 2 diastolic dysfunction, mild MR, PASP 33. Renal artery duppler 2019 with bilateral 1-59% stenosis. CT-A abdomen with 1.3 cm renal cyst but no adrenal adenomas. No evidence of renal artery stenosis.    She has a hx of recurrent syncope. 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 She has sleep apnea and uses BIPAP. She had LHC 05/2019 with finding of 60% distal LCx and 80% distal LAD disease. She was started on Clopidogrel due to allergy to aspirin. LE duplex 10/2019 for lower extremity edema was negative for DVT.    Blood pressure has been difficult to control. Doxazosin ordered at initial clinic visit. Amlodipine subsequently added. She has been given Metolazone in addition to Torsemide due to edema.  Her hydralazine was reduced to 50mg  TID and doxazosin increased to 8mg  twice daily to try to reduce TID dosing. Ranexa increased due to chest pain with improvement. Clonidine increased to 0.6mg  patch. Monitor 03/23/20  showing PVC with up to 8 beats of SVT.     R/LHC 10/25/20 with distal LAD 80% stenosed, LPAV lesion 30% stenosed with 50% stenosed side branch in first LPL. Hyperdynamic LV systolic function was noted. Hemodynamic findings were consistent with mild secondary pulmonary hypertension. Overall unchanged from previous. Angina was  suspected due to accelerated hypertension, increased wall stress, and microvascular ischemia. Distal LAD not favorable for PCI but could be considered for intractable pain. BP markedly elevated 200s/110s which improved after medication to 152/78.   Echo 04/05/21 normal LVEF 55-60%, mild LVH, normal diastolic parameters, mildly elevated PASP, bilateral atria moderately dilated, mild MR. Based on follow up labs her Metolazone was reduced from 5mg  to 2.5mg  weekly.  Minoxidil was increased to 5 mg twice daily.   At follow up 05/2021 BP 150s/70s at home. She was not taking Doxazosin nor Minoxidil and these were resumed. At follow up 07/16/21, HCTZ transitioned to Chlorthalidone. Doxasozin dose increased.  She wore a 24h BP monitor with average daytime BP 135/68 and during white coat period 182/96. Average overall  BP on monitor 127/65. Suspected inaccurate as lower than her home readings.    At visit 10/02/22 Diltiazem increased to 300mg  every day due to palpitations. Updated renal duplex with minimal stenosis. She had renal denervation 11/20/2022. Seen 11/28/22 with BP at home 140-150s. Unfortunately, Reginal Lutes was denied. BMP/BNP/CBC ordered due to dyspnea were unremarkable.    ED visit 12/25/2022 with palpitations upon awakening. HS troponin 24 ? 24. BNP/CBC/CMP unremarkable.  Presents today for follow up. BP at home 140-150s. Occasional chest pain at rest or with activity which is fleeting and not bothersome. Breathing has been "rough" she feel "full/ heavy". Saw pulmonology recently with no medication changes. Reports her heart rate is "ridiculous" with palpitations with readings 100s-130s. No concurrent symptoms. No excessive caffeine. Not presently on prednisone. Palpitations have been  04/20/2018  Narrative  The left ventricular ejection fraction is moderately decreased (30-44%).  Nuclear stress EF: 41%.  There was no ST segment deviation noted during stress.  This is an intermediate risk study.  Abnormal, intermediate risk stress nuclear study with no ischemia or infarction; EF 41 with global hypokinesis and mild LVE; study intermediate risk due to reduced LV function.   ECHOCARDIOGRAM  ECHOCARDIOGRAM COMPLETE 04/05/2021  Narrative ECHOCARDIOGRAM REPORT    Patient Name:   Erica [name] Date of Exam: 04/05/2021 Medical  Rec #:  [id number]      Height:       65.0 in Accession #:    [id number]     Weight:       216.8 lb Date of Birth:  Mar 13, 1976      BSA:          2.047 m Patient Age:    45 years       BP:           198/94 mmHg Patient Gender: F              HR:           71 bpm. Exam Location:  Outpatient  Procedure: 2D Echo, Cardiac Doppler, Color Doppler and Strain Analysis  Indications:    R06.02 SOB; R60.0 Lower extremity edema; Z51.11 Encounter for antineoplastic chemotheraphy  History:        Patient has prior history of Echocardiogram examinations, most recent 03/12/2018. CHF, CAD, Signs/Symptoms:Shortness of Breath and Edema; Risk Factors:Hypertension, Sleep Apnea, Dyslipidemia and Non-Smoker. Patient denies chest pain but does have ongoing SOB with bilateral leg edema. Patient undergoing oral chemotherapy for left breast cancer.  Sonographer:    Carlos American RVT, RDCS (AE), RDMS Referring Phys: [phone] [facility] [place]   Sonographer Comments: Patient is morbidly obese. Image acquisition challenging due to patient body habitus. IMPRESSIONS   1. Left ventricular ejection fraction, by estimation, is 55 to 60%. The left ventricle has normal function. The left ventricle has no regional wall motion abnormalities. There is mild left ventricular hypertrophy. Left ventricular diastolic parameters were normal. The average left ventricular global longitudinal strain is -15.0 %. The global longitudinal strain is abnormal. 2. Right ventricular systolic function is normal. The right ventricular size is mildly enlarged. There is mildly elevated pulmonary artery systolic pressure. The estimated right ventricular systolic pressure is 40.2 mmHg. 3. Left atrial size was moderately dilated. 4. Right atrial size was moderately dilated. 5. The mitral valve is normal in structure. Mild mitral valve regurgitation. No evidence of mitral stenosis. 6. The aortic valve is normal in structure. Aortic valve  regurgitation is not visualized. No aortic stenosis is present. 7. The inferior vena cava is normal in size with greater than 50% respiratory variability, suggesting right atrial pressure of 3 mmHg.  Comparison(s): EF 45%, moderate LVH, GLS -13.1%.  FINDINGS Left Ventricle: Left ventricular ejection fraction, by estimation, is 55 to 60%. The left ventricle has normal function. The left ventricle has no regional wall motion abnormalities. The average left ventricular global longitudinal strain is -15.0 %. The global longitudinal strain is abnormal. 3D left ventricular ejection fraction analysis performed but not reported based on interpreter judgement due to suboptimal tracking. The left ventricular internal cavity size was normal in size. There is mild left ventricular hypertrophy. Left ventricular diastolic parameters were normal.  Right Ventricle: The right ventricular size is mildly enlarged. No increase in right ventricular wall thickness. Right ventricular systolic function is normal. There is  Cardiology Office Note:  .   Date:  01/09/2023  ID:  Erica [name], DOB 12-05-1975, MRN [id number] PCP: Patient, No Pcp Per  [facility] Providers Cardiologist:  Chilton Si, MD    History of Present Illness: .   Erica [name] is a 47 y.o. female with a hx of resistant hypertension, chronic diastolic heart failure, breast cancer s/p chemo and XRT, morbid obesity, OSA on BIPAP, renal artery stenosis, CAD, HLD.   She has history of hypertension dating back to treatment of breast cancer in 2010. She has established with Dr. Duke Salvia in the hypertension clinic. Previous echocardiogram 03/2018 LVEF 45-50%, grade 2 diastolic dysfunction, mild MR, PASP 33. Renal artery duppler 2019 with bilateral 1-59% stenosis. CT-A abdomen with 1.3 cm renal cyst but no adrenal adenomas. No evidence of renal artery stenosis.    She has a hx of recurrent syncope. 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 She has sleep apnea and uses BIPAP. She had LHC 05/2019 with finding of 60% distal LCx and 80% distal LAD disease. She was started on Clopidogrel due to allergy to aspirin. LE duplex 10/2019 for lower extremity edema was negative for DVT.    Blood pressure has been difficult to control. Doxazosin ordered at initial clinic visit. Amlodipine subsequently added. She has been given Metolazone in addition to Torsemide due to edema.  Her hydralazine was reduced to 50mg  TID and doxazosin increased to 8mg  twice daily to try to reduce TID dosing. Ranexa increased due to chest pain with improvement. Clonidine increased to 0.6mg  patch. Monitor 03/23/20  showing PVC with up to 8 beats of SVT.     R/LHC 10/25/20 with distal LAD 80% stenosed, LPAV lesion 30% stenosed with 50% stenosed side branch in first LPL. Hyperdynamic LV systolic function was noted. Hemodynamic findings were consistent with mild secondary pulmonary hypertension. Overall unchanged from previous. Angina was  suspected due to accelerated hypertension, increased wall stress, and microvascular ischemia. Distal LAD not favorable for PCI but could be considered for intractable pain. BP markedly elevated 200s/110s which improved after medication to 152/78.   Echo 04/05/21 normal LVEF 55-60%, mild LVH, normal diastolic parameters, mildly elevated PASP, bilateral atria moderately dilated, mild MR. Based on follow up labs her Metolazone was reduced from 5mg  to 2.5mg  weekly.  Minoxidil was increased to 5 mg twice daily.   At follow up 05/2021 BP 150s/70s at home. She was not taking Doxazosin nor Minoxidil and these were resumed. At follow up 07/16/21, HCTZ transitioned to Chlorthalidone. Doxasozin dose increased.  She wore a 24h BP monitor with average daytime BP 135/68 and during white coat period 182/96. Average overall  BP on monitor 127/65. Suspected inaccurate as lower than her home readings.    At visit 10/02/22 Diltiazem increased to 300mg  every day due to palpitations. Updated renal duplex with minimal stenosis. She had renal denervation 11/20/2022. Seen 11/28/22 with BP at home 140-150s. Unfortunately, Reginal Lutes was denied. BMP/BNP/CBC ordered due to dyspnea were unremarkable.    ED visit 12/25/2022 with palpitations upon awakening. HS troponin 24 ? 24. BNP/CBC/CMP unremarkable.  Presents today for follow up. BP at home 140-150s. Occasional chest pain at rest or with activity which is fleeting and not bothersome. Breathing has been "rough" she feel "full/ heavy". Saw pulmonology recently with no medication changes. Reports her heart rate is "ridiculous" with palpitations with readings 100s-130s. No concurrent symptoms. No excessive caffeine. Not presently on prednisone. Palpitations have been  Cardiology Office Note:  .   Date:  01/09/2023  ID:  Erica [name], DOB 12-05-1975, MRN [id number] PCP: Patient, No Pcp Per  [facility] Providers Cardiologist:  Chilton Si, MD    History of Present Illness: .   Erica [name] is a 47 y.o. female with a hx of resistant hypertension, chronic diastolic heart failure, breast cancer s/p chemo and XRT, morbid obesity, OSA on BIPAP, renal artery stenosis, CAD, HLD.   She has history of hypertension dating back to treatment of breast cancer in 2010. She has established with Dr. Duke Salvia in the hypertension clinic. Previous echocardiogram 03/2018 LVEF 45-50%, grade 2 diastolic dysfunction, mild MR, PASP 33. Renal artery duppler 2019 with bilateral 1-59% stenosis. CT-A abdomen with 1.3 cm renal cyst but no adrenal adenomas. No evidence of renal artery stenosis.    She has a hx of recurrent syncope. 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 She has sleep apnea and uses BIPAP. She had LHC 05/2019 with finding of 60% distal LCx and 80% distal LAD disease. She was started on Clopidogrel due to allergy to aspirin. LE duplex 10/2019 for lower extremity edema was negative for DVT.    Blood pressure has been difficult to control. Doxazosin ordered at initial clinic visit. Amlodipine subsequently added. She has been given Metolazone in addition to Torsemide due to edema.  Her hydralazine was reduced to 50mg  TID and doxazosin increased to 8mg  twice daily to try to reduce TID dosing. Ranexa increased due to chest pain with improvement. Clonidine increased to 0.6mg  patch. Monitor 03/23/20  showing PVC with up to 8 beats of SVT.     R/LHC 10/25/20 with distal LAD 80% stenosed, LPAV lesion 30% stenosed with 50% stenosed side branch in first LPL. Hyperdynamic LV systolic function was noted. Hemodynamic findings were consistent with mild secondary pulmonary hypertension. Overall unchanged from previous. Angina was  suspected due to accelerated hypertension, increased wall stress, and microvascular ischemia. Distal LAD not favorable for PCI but could be considered for intractable pain. BP markedly elevated 200s/110s which improved after medication to 152/78.   Echo 04/05/21 normal LVEF 55-60%, mild LVH, normal diastolic parameters, mildly elevated PASP, bilateral atria moderately dilated, mild MR. Based on follow up labs her Metolazone was reduced from 5mg  to 2.5mg  weekly.  Minoxidil was increased to 5 mg twice daily.   At follow up 05/2021 BP 150s/70s at home. She was not taking Doxazosin nor Minoxidil and these were resumed. At follow up 07/16/21, HCTZ transitioned to Chlorthalidone. Doxasozin dose increased.  She wore a 24h BP monitor with average daytime BP 135/68 and during white coat period 182/96. Average overall  BP on monitor 127/65. Suspected inaccurate as lower than her home readings.    At visit 10/02/22 Diltiazem increased to 300mg  every day due to palpitations. Updated renal duplex with minimal stenosis. She had renal denervation 11/20/2022. Seen 11/28/22 with BP at home 140-150s. Unfortunately, Reginal Lutes was denied. BMP/BNP/CBC ordered due to dyspnea were unremarkable.    ED visit 12/25/2022 with palpitations upon awakening. HS troponin 24 ? 24. BNP/CBC/CMP unremarkable.  Presents today for follow up. BP at home 140-150s. Occasional chest pain at rest or with activity which is fleeting and not bothersome. Breathing has been "rough" she feel "full/ heavy". Saw pulmonology recently with no medication changes. Reports her heart rate is "ridiculous" with palpitations with readings 100s-130s. No concurrent symptoms. No excessive caffeine. Not presently on prednisone. Palpitations have been  04/20/2018  Narrative  The left ventricular ejection fraction is moderately decreased (30-44%).  Nuclear stress EF: 41%.  There was no ST segment deviation noted during stress.  This is an intermediate risk study.  Abnormal, intermediate risk stress nuclear study with no ischemia or infarction; EF 41 with global hypokinesis and mild LVE; study intermediate risk due to reduced LV function.   ECHOCARDIOGRAM  ECHOCARDIOGRAM COMPLETE 04/05/2021  Narrative ECHOCARDIOGRAM REPORT    Patient Name:   Erica [name] Date of Exam: 04/05/2021 Medical  Rec #:  [id number]      Height:       65.0 in Accession #:    [id number]     Weight:       216.8 lb Date of Birth:  Mar 13, 1976      BSA:          2.047 m Patient Age:    45 years       BP:           198/94 mmHg Patient Gender: F              HR:           71 bpm. Exam Location:  Outpatient  Procedure: 2D Echo, Cardiac Doppler, Color Doppler and Strain Analysis  Indications:    R06.02 SOB; R60.0 Lower extremity edema; Z51.11 Encounter for antineoplastic chemotheraphy  History:        Patient has prior history of Echocardiogram examinations, most recent 03/12/2018. CHF, CAD, Signs/Symptoms:Shortness of Breath and Edema; Risk Factors:Hypertension, Sleep Apnea, Dyslipidemia and Non-Smoker. Patient denies chest pain but does have ongoing SOB with bilateral leg edema. Patient undergoing oral chemotherapy for left breast cancer.  Sonographer:    Carlos American RVT, RDCS (AE), RDMS Referring Phys: [phone] [facility] [place]   Sonographer Comments: Patient is morbidly obese. Image acquisition challenging due to patient body habitus. IMPRESSIONS   1. Left ventricular ejection fraction, by estimation, is 55 to 60%. The left ventricle has normal function. The left ventricle has no regional wall motion abnormalities. There is mild left ventricular hypertrophy. Left ventricular diastolic parameters were normal. The average left ventricular global longitudinal strain is -15.0 %. The global longitudinal strain is abnormal. 2. Right ventricular systolic function is normal. The right ventricular size is mildly enlarged. There is mildly elevated pulmonary artery systolic pressure. The estimated right ventricular systolic pressure is 40.2 mmHg. 3. Left atrial size was moderately dilated. 4. Right atrial size was moderately dilated. 5. The mitral valve is normal in structure. Mild mitral valve regurgitation. No evidence of mitral stenosis. 6. The aortic valve is normal in structure. Aortic valve  regurgitation is not visualized. No aortic stenosis is present. 7. The inferior vena cava is normal in size with greater than 50% respiratory variability, suggesting right atrial pressure of 3 mmHg.  Comparison(s): EF 45%, moderate LVH, GLS -13.1%.  FINDINGS Left Ventricle: Left ventricular ejection fraction, by estimation, is 55 to 60%. The left ventricle has normal function. The left ventricle has no regional wall motion abnormalities. The average left ventricular global longitudinal strain is -15.0 %. The global longitudinal strain is abnormal. 3D left ventricular ejection fraction analysis performed but not reported based on interpreter judgement due to suboptimal tracking. The left ventricular internal cavity size was normal in size. There is mild left ventricular hypertrophy. Left ventricular diastolic parameters were normal.  Right Ventricle: The right ventricular size is mildly enlarged. No increase in right ventricular wall thickness. Right ventricular systolic function is normal. There is  Cardiology Office Note:  .   Date:  01/09/2023  ID:  Erica [name], DOB 12-05-1975, MRN [id number] PCP: Patient, No Pcp Per  [facility] Providers Cardiologist:  Chilton Si, MD    History of Present Illness: .   Erica [name] is a 47 y.o. female with a hx of resistant hypertension, chronic diastolic heart failure, breast cancer s/p chemo and XRT, morbid obesity, OSA on BIPAP, renal artery stenosis, CAD, HLD.   She has history of hypertension dating back to treatment of breast cancer in 2010. She has established with Dr. Duke Salvia in the hypertension clinic. Previous echocardiogram 03/2018 LVEF 45-50%, grade 2 diastolic dysfunction, mild MR, PASP 33. Renal artery duppler 2019 with bilateral 1-59% stenosis. CT-A abdomen with 1.3 cm renal cyst but no adrenal adenomas. No evidence of renal artery stenosis.    She has a hx of recurrent syncope. 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 She has sleep apnea and uses BIPAP. She had LHC 05/2019 with finding of 60% distal LCx and 80% distal LAD disease. She was started on Clopidogrel due to allergy to aspirin. LE duplex 10/2019 for lower extremity edema was negative for DVT.    Blood pressure has been difficult to control. Doxazosin ordered at initial clinic visit. Amlodipine subsequently added. She has been given Metolazone in addition to Torsemide due to edema.  Her hydralazine was reduced to 50mg  TID and doxazosin increased to 8mg  twice daily to try to reduce TID dosing. Ranexa increased due to chest pain with improvement. Clonidine increased to 0.6mg  patch. Monitor 03/23/20  showing PVC with up to 8 beats of SVT.     R/LHC 10/25/20 with distal LAD 80% stenosed, LPAV lesion 30% stenosed with 50% stenosed side branch in first LPL. Hyperdynamic LV systolic function was noted. Hemodynamic findings were consistent with mild secondary pulmonary hypertension. Overall unchanged from previous. Angina was  suspected due to accelerated hypertension, increased wall stress, and microvascular ischemia. Distal LAD not favorable for PCI but could be considered for intractable pain. BP markedly elevated 200s/110s which improved after medication to 152/78.   Echo 04/05/21 normal LVEF 55-60%, mild LVH, normal diastolic parameters, mildly elevated PASP, bilateral atria moderately dilated, mild MR. Based on follow up labs her Metolazone was reduced from 5mg  to 2.5mg  weekly.  Minoxidil was increased to 5 mg twice daily.   At follow up 05/2021 BP 150s/70s at home. She was not taking Doxazosin nor Minoxidil and these were resumed. At follow up 07/16/21, HCTZ transitioned to Chlorthalidone. Doxasozin dose increased.  She wore a 24h BP monitor with average daytime BP 135/68 and during white coat period 182/96. Average overall  BP on monitor 127/65. Suspected inaccurate as lower than her home readings.    At visit 10/02/22 Diltiazem increased to 300mg  every day due to palpitations. Updated renal duplex with minimal stenosis. She had renal denervation 11/20/2022. Seen 11/28/22 with BP at home 140-150s. Unfortunately, Reginal Lutes was denied. BMP/BNP/CBC ordered due to dyspnea were unremarkable.    ED visit 12/25/2022 with palpitations upon awakening. HS troponin 24 ? 24. BNP/CBC/CMP unremarkable.  Presents today for follow up. BP at home 140-150s. Occasional chest pain at rest or with activity which is fleeting and not bothersome. Breathing has been "rough" she feel "full/ heavy". Saw pulmonology recently with no medication changes. Reports her heart rate is "ridiculous" with palpitations with readings 100s-130s. No concurrent symptoms. No excessive caffeine. Not presently on prednisone. Palpitations have been

## 2023-01-24 LAB — B12 AND FOLATE PANEL
Folate: 4.3 ng/mL (ref >3.0)
Vitamin B-12: 580 pg/mL (ref 232–1245)

## 2023-01-24 LAB — VITAMIN D 1,25 DIHYDROXY
Vitamin D 1, 25 (OH)2 Total: 96 pg/mL — ABNORMAL HIGH
Vitamin D2 1, 25 (OH)2: <10 pg/mL
Vitamin D3 1, 25 (OH)2: 96 pg/mL

## 2023-01-30 ENCOUNTER — Ambulatory Visit (HOSPITAL_BASED_OUTPATIENT_CLINIC_OR_DEPARTMENT_OTHER): Payer: 59

## 2023-01-30 ENCOUNTER — Other Ambulatory Visit: Payer: Self-pay

## 2023-01-30 DIAGNOSIS — I25118 Atherosclerotic heart disease of native coronary artery with other forms of angina pectoris: Secondary | ICD-10-CM

## 2023-01-30 DIAGNOSIS — I701 Atherosclerosis of renal artery: Secondary | ICD-10-CM

## 2023-01-30 DIAGNOSIS — E876 Hypokalemia: Secondary | ICD-10-CM

## 2023-01-30 MED ORDER — POTASSIUM CHLORIDE ER 20 MEQ PO TBCR: 2.0000 | EXTENDED_RELEASE_TABLET | Freq: Two times a day (BID) | ORAL | 3 refills | Status: DC

## 2023-01-31 ENCOUNTER — Encounter: Payer: Self-pay | Admitting: Oncology

## 2023-01-31 ENCOUNTER — Telehealth: Payer: Self-pay | Admitting: Pharmacy Technician

## 2023-01-31 ENCOUNTER — Telehealth (HOSPITAL_BASED_OUTPATIENT_CLINIC_OR_DEPARTMENT_OTHER): Payer: Self-pay

## 2023-01-31 LAB — VAS US ABI WITH/WO TBI
Left ABI: 1.35
Right ABI: 1.29

## 2023-01-31 NOTE — Telephone Encounter (Signed)
Pharmacy Patient Advocate Encounter   Received notification from Pt Calls Messages that prior authorization for wegovy is required/requested.   Insurance verification completed.   The patient is insured through [facility] .   Per test claim: PA required; PA submitted to above mentioned insurance via Fax Key/confirmation #[id number] faxed appeal Status is pending

## 2023-01-31 NOTE — Telephone Encounter (Signed)
Per Gillian Shields, NP,   Please try to resubmit prior auth for wegovy using the following diagnosis codes  ICD 10 code PAD I73.9

## 2023-02-03 ENCOUNTER — Encounter (HOSPITAL_BASED_OUTPATIENT_CLINIC_OR_DEPARTMENT_OTHER): Payer: Self-pay | Admitting: Family Medicine

## 2023-02-03 ENCOUNTER — Other Ambulatory Visit (HOSPITAL_BASED_OUTPATIENT_CLINIC_OR_DEPARTMENT_OTHER): Payer: Self-pay

## 2023-02-03 ENCOUNTER — Ambulatory Visit (HOSPITAL_BASED_OUTPATIENT_CLINIC_OR_DEPARTMENT_OTHER): Payer: 59 | Admitting: Family Medicine

## 2023-02-03 VITALS — BP 185/94 | HR 84 | Ht 65.0 in | Wt 215.2 lb

## 2023-02-03 DIAGNOSIS — F331 Major depressive disorder, recurrent, moderate: Secondary | ICD-10-CM

## 2023-02-03 DIAGNOSIS — Z1211 Encounter for screening for malignant neoplasm of colon: Secondary | ICD-10-CM

## 2023-02-03 DIAGNOSIS — R7301 Impaired fasting glucose: Secondary | ICD-10-CM | POA: Diagnosis not present

## 2023-02-03 DIAGNOSIS — F411 Generalized anxiety disorder: Secondary | ICD-10-CM | POA: Diagnosis not present

## 2023-02-03 DIAGNOSIS — R739 Hyperglycemia, unspecified: Secondary | ICD-10-CM

## 2023-02-03 DIAGNOSIS — I10 Essential (primary) hypertension: Secondary | ICD-10-CM

## 2023-02-03 LAB — POCT GLYCOSYLATED HEMOGLOBIN (HGB A1C)
HbA1c, POC (prediabetic range): 6 % (ref 5.7–6.4)
Hemoglobin A1C: 6 % — AB (ref 4.0–5.6)

## 2023-02-03 MED ORDER — AMITRIPTYLINE HCL 150 MG PO TABS: 150.0000 mg | ORAL_TABLET | Freq: Every day | ORAL | 3 refills | Status: DC

## 2023-02-03 NOTE — Patient Instructions (Addendum)
Please schedule your Colonoscopy.  I have adjusted your amitriptyline to 150mg  daily. We will recheck in 4 weeks or sooner if needed.   Counseling and Mental Health Resources   [facility]  - For Women and Girls only - Cost based upon sliding scale of income - Financial Aid available  [phone] [address], [address] [place], [place] [address] [facility]  - Mental Health, Substance Abuse Treatment - IV Ketamine, Hydration and Weight Loss Programs - Center for Treatment for Resistant Depression and Suicidal Ideation  [address] [place], [place] [address]  [phone] [email or web address]   [facility]  - Individual and Family Counseling - Assessments and Therapy for Learning Disabilities, ADHD, Autism Spectrum Disorder, Processing Deficits  [phone] [address], [address] [place], [place] [address]  [facility]  [address] [place] [address] [place], [place] [address]  [phone]  [facility]  - Individual, Family, Play and Group Therapy - In person and telehealth sessions available  Phone: [phone] Email: [email or web address]  [place] Location:   [address] [place] [address] [place], [place] [address]   [place] Location:   [address] [place], [place] [address] [facility]  [address] ([address]) [place], [place] [address]  [phone]  [facility], [facility]  Adult, Adolescent and [facility]  [address], [place] [address]  Text:  [phone]   Call:  [phone] Email: [email or web address] Su Ley MA Clinical Psychology  [address] [place] [address]  [phone]  [facility]  - Individual, Group Therapy - Day Programs, Wellness Coaching -  Staff Programming, Workshops  [address] [place], [place] [address]  [phone]  [facility] - [facility]    [facility]  - Indivudual Counseling, [facility] [address] [place], [place] [address]  [phone] [facility]  - Children, Adolescent, Adult and Family Therapy  [place] Location [phone]  [address] [place], [place] [address]   [place] Location [phone]  [address] [place] [address], [place] [address]    [facility]  Counseling offered by [employer]  - Majority of Patients qualify for financial assistance   [address] [place], [place] [address]  [phone]   [facility]  -Services at "less than a basic fee" sponsored by [facility]  [address] [place], [place] [address]  [phone]

## 2023-02-03 NOTE — Progress Notes (Unsigned)
New Patient Office Visit  Subjective:   Erica [name] 17-Nov-1975 02/03/2023  Chief Complaint  Patient presents with   New Patient (Initial Visit)    Patient is here today to get established with the practice. Denies any main concerns for today's visit.    HPI: Erica [name] presents today to establish care at [facility] at [facility]. Introduced to Publishing rights manager role and practice setting.  All questions answered.   Patient was a previous patient of Dr. [name] as her PCP.  Has a history of significant comorbidities and is followed by several specialist in cardiology, gastroenterology and oncology.  She is a 47 year old female with history of malignant hypertension, malignant neoplasm of left breast, acute on chronic diastolic CHF class II, morbid obesity, intracranial hypertension status post VP shunt, impaired fasting glucose and hypertriglyceridemia.  Her concerns today include changes in mood, anxiety and concerned about fasting glucose levels.  IMPAIRED FASTING GLUCOSE Erica [name] is here for medical management of impaired fasting glucose.  Patient's current IFG medication regimen is: Diet controlled Adhering to a diabetic diet: Yes currently Exercising Regularly: No due to comorbidities Checking Blood Sugars: Not currently Denies polydipsia, polyphagia, polyuria.  Lab Results  Component Value Date   HGBA1C 6.0 (A) 02/03/2023   HGBA1C 6.0 02/03/2023   Wt Readings from Last 3 Encounters:  02/03/23 215 lb 3.2 oz (97.6 kg)  01/09/23 219 lb 1.6 oz (99.4 kg)  01/03/23 218 lb 12.8 oz (99.2 kg)    ANXIETY: Erica [name] presents for the medical management of anxiety. Reports previously on Lorazepam for anxiety without relief. She states she has noticed increased anxiety, irritation and anger and panic attacks at times. Reports becoming frequently tearful  Current medication regimen: Elavil 100mg  daily, Cymbalta 90mg   daily Counseling: Recommended Well controlled: Not currently Denies SI/HI.     02/03/2023    9:39 AM 04/08/2017   12:52 PM 12/09/2016    1:52 PM  GAD 7 : Generalized Anxiety Score  Nervous, Anxious, on Edge 1 1 3   Control/stop worrying 3 1 3   Worry too much - different things 3 1 3   Trouble relaxing 2 2 3   Restless 3 1 3   Easily annoyed or irritable 2 2 3   Afraid - awful might happen 1 0 0  Total GAD 7 Score 15 8 18   Anxiety Difficulty Somewhat difficult        02/03/2023    9:38 AM 05/06/2019    8:42 AM 10/01/2018   11:06 AM 04/08/2017   12:52 PM 02/03/2017    1:08 PM  Depression screen PHQ 2/9  Decreased Interest 3 1 2 2  0  Down, Depressed, Hopeless 0 0 1 2 0  PHQ - 2 Score 3 1 3 4  0  Altered sleeping 3  2 2    Tired, decreased energy 3  1 2    Change in appetite 3  1 2    Feeling bad or failure about yourself  3  1 2    Trouble concentrating 0  1 2   Moving slowly or fidgety/restless 1  0 0   Suicidal thoughts 0  0 0   PHQ-9 Score 16  9 14    Difficult doing work/chores Somewhat difficult  Not difficult at all       The following portions of the patient's history were reviewed and updated as appropriate: past medical history, past surgical history, family history, social history, allergies, medications, and problem list.  Patient Active Problem List   Diagnosis Date Noted   Moderate episode of recurrent major depressive disorder (HCC) 02/04/2023   GAD (generalized anxiety disorder) 02/04/2023   Chronic respiratory failure with hypoxia (HCC) 12/31/2021   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   Coronary artery disease    Chronic stable angina (HCC) 08/22/2020   SVT (supraventricular tachycardia) (HCC) 08/22/2020   Palpitations 02/28/2020   Right ankle pain 04/14/2019   OSA (obstructive sleep apnea) 10/01/2018   Tibialis posterior tendinitis, left 07/15/2018   Chronic pain in left foot 06/29/2018   Vitamin D deficiency disease 03/31/2018   Persistent proteinuria 03/31/2018    Hyperlipidemia LDL goal <100 03/30/2018   Chemotherapy-induced peripheral neuropathy (HCC) 12/09/2016   Asthma in adult, mild persistent, uncomplicated 01/19/2016   Hypertensive heart disease 09/26/2015   Depression with somatization 06/15/2015   Hypertriglyceridemia without hypercholesterolemia 06/15/2015   Hypokalemia 06/15/2015   Insomnia secondary to anxiety 04/05/2015   Idiopathic intracranial hypertension 10/21/2014   Upper airway cough syndrome 10/09/2014   Malignant hypertension 10/09/2014   Morbid obesity due to excess calories (HCC) 10/09/2014   Apnea, sleep 05/04/2014   Acute on chronic diastolic CHF (congestive heart failure), NYHA class 2 (HCC) 05/03/2014   DOE (dyspnea on exertion) 07/14/2013   Acute on chronic diastolic heart failure (HCC) 06/23/2013   Malignant neoplasm of upper-outer quadrant of left breast in female, estrogen receptor negative (HCC) 01/07/2013   Gastroparesis 01/04/2013   Resistant hypertension 09/17/2011   Axillary hidradenitis suppurativa 08/01/2011   Preventative health care 04/13/2011   Hyperglycemia 04/13/2011   GERD (gastroesophageal reflux disease) 03/18/2011   Cough variant asthma 09/27/2010   Past Medical History:  Diagnosis Date   Allergy    Anxiety    Apnea, sleep 05/04/2014   AHI 31/hr now on CPAP at 12cm H2O   Arthritis    Asthma    Asthma without status asthmaticus 01/19/2016   Axillary hidradenitis suppurativa 08/01/2011   Blood transfusion without reported diagnosis    Breast cancer (HCC) 2010   a. locally advanced left breast carcinoma diagnosed in 2010 and treated with neoadjuvant chemotherapy with docetaxel and cyclophosphamide as well as paclitaxel. This was followed by radiation therapy which was completed in 2011.   Chemotherapy-induced peripheral neuropathy (HCC) 12/09/2016   CHF (congestive heart failure) (HCC)    Chronic diastolic CHF (congestive heart failure) (HCC) 06/23/2013   Chronic kidney disease    Chronic  pain in left foot 06/29/2018   Chronic stable angina (HCC) 08/22/2020   Cough variant asthma 09/27/2010   Followed in Pulmonary clinic/ [facility]/ [facility]  Onset reported in childhood  - PFT's 01/31/2011 FEV1  1.76 (60% ) ratio 68 and no better p B2,  DLCO 70% -med review 09/25/2012 > [place] drug assistance  paperwork given    - HFA 90% 11/20/2012 .  -Med calendar 12/07/12 , 04/06/2013 , 04/18/2014,, 08/30/2014 , 11/08/2014  - PFT's 01/12/2013  1.73 (65%)  Ratio 73 and no better p B2, DLCO 86% - trial off    Depression with somatization 06/15/2015   Dyspnea on exertion    a. 01/2013 Lexi MV: EF 54%, no ischemia/infarct;  b. 05/2013 Echo: EF 60-65%, no rwma, Gr 2 DD;  b.    Essential hypertension 10/09/2014   Estimated Creatinine Clearance: 82.4 mL/min (by C-G formula based on SCr of 0.98 mg/dL).   Fatty liver    Gastroparesis    GERD (gastroesophageal reflux disease)    Hyperglycemia 04/13/2011   Hypertension    Hypertensive  heart disease 09/26/2015   Hypertriglyceridemia without hypercholesterolemia 06/15/2015   Framingham risk score is 1%    Idiopathic intracranial hypertension 10/21/2014   Impaired glucose tolerance 04/13/2011   Insomnia secondary to anxiety 04/05/2015   Lymphadenitis, chronic    restricted LEFT extremity   Malignant neoplasm of upper-outer quadrant of left breast in female, estrogen receptor negative (HCC) 01/07/2013   Stage III, Invasive Ductal, s/p partial Left Mastectomy/Lumpectomy (baseball sized) with Lymph Node dissection, had Chemo and RadRx     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   OSA (obstructive sleep apnea) 05/04/2014   AHI 31/hr now on CPAP at 12cm H2O   Oxygen deficiency    Uses 02 in CPAP   Persistent proteinuria 03/31/2018   Personal history of chemotherapy 2010   Personal history of radiation therapy 2010   Resistant hypertension 09/17/2011       Sleep apnea    SVT (supraventricular tachycardia) (HCC) 08/22/2020   Tibialis posterior tendinitis,  left 07/15/2018   Injected in March 10, 2019   Ulcer    Upper airway cough syndrome 10/09/2014   Onset around 2012 with assoc pseudowheeze MRI 05/18/15 > Paranasal sinuses are clear - 11/17/2015 rec increase h1 to 2 hs to help with noct cough  - Allergy profile 11/17/2015 >  Eos 0.1 /  IgE  80 POS RAST  to grass, ragweed, cedar tree  - d/c acei 03/06/2018  - flare end of Jan 2020 in setting of ? Uri/ poorly controlled GERD - flare mid Oct 2020 with uri / assoc secondary gerd   Vitamin D deficiency disease 03/31/2018   Past Surgical History:  Procedure Laterality Date   BRAIN SURGERY     Shunt   BREAST LUMPECTOMY Left 09/2008   CARPAL TUNNEL RELEASE Left 2011   LYMPH NODE DISSECTION Left 2010   breast; 2 wks after breast lumpectomy   RENAL DENERVATION N/A 11/20/2022   Procedure: RENAL DENERVATION;  Surgeon: Iran Ouch, MD;  Location: [facility] INVASIVE CV LAB;  Service: Cardiovascular;  Laterality: N/A;   RIGHT/LEFT HEART CATH AND CORONARY ANGIOGRAPHY N/A 05/04/2019   Procedure: RIGHT/LEFT HEART CATH AND CORONARY ANGIOGRAPHY;  Surgeon: Dolores Patty, MD;  Location: [facility] INVASIVE CV LAB;  Service: Cardiovascular;  Laterality: N/A;  Renal injection preformed    RIGHT/LEFT HEART CATH AND CORONARY ANGIOGRAPHY N/A 10/25/2020   Procedure: RIGHT/LEFT HEART CATH AND CORONARY ANGIOGRAPHY;  Surgeon: Marykay Lex, MD;  Location: [facility] INVASIVE CV LAB;  Service: Cardiovascular;  Laterality: N/A;   TENDON RELEASE Right 2012   "hand"   TUBAL LIGATION  05/11/2011   Procedure: POST PARTUM TUBAL LIGATION;  Surgeon: Scheryl Darter, MD;  Location: [facility] ORS;  Service: Gynecology;  Laterality: Bilateral;  Induced for HTN   Family History  Problem Relation Age of Onset   Other Mother 55       breast calcifications treated with surgery, breast cancer pill, and radiation   Diabetes Mother    Fibroids Sister 76       s/p TAH-BSO   Diabetes Maternal Grandmother    Heart disease Maternal Grandmother    Hypertension  Maternal Grandmother    CAD Maternal Grandmother    Breast cancer Other        maternal great aunt (MGM's sister)   Breast cancer Other        paternal great aunt dx middle ages; s/p mastectomy   Cancer Neg Hx    Alcohol abuse Neg Hx    Early death  Neg Hx    Hyperlipidemia Neg Hx    Kidney disease Neg Hx    Stroke Neg Hx    Colon cancer Neg Hx    Esophageal cancer Neg Hx    Stomach cancer Neg Hx    Rectal cancer Neg Hx    Colon polyps Neg Hx    Social History   Socioeconomic History   Marital status: Single    Spouse name: Not on file   Number of children: 3   Years of education: Not on file   Highest education level: Bachelor's degree (e.g., BA, AB, BS)  Occupational History    Employer: UNEMPLOYED  Tobacco Use   Smoking status: Never   Smokeless tobacco: Never  Vaping Use   Vaping status: Never Used  Substance and Sexual Activity   Alcohol use: Yes    Comment: occasionally/socially   Drug use: No   Sexual activity: Yes    Birth control/protection: Surgical, None  Other Topics Concern   Not on file  Social History Narrative   She lives with four children.   She is currently not working (disbaility pending).   Left handed   One story home   Drinks caffeine      Social Determinants of Health   Financial Resource Strain: High Risk (01/27/2023)   Overall Financial Resource Strain (CARDIA)    Difficulty of Paying Living Expenses: Very hard  Food Insecurity: Food Insecurity Present (01/27/2023)   Hunger Vital Sign    Worried About Running Out of Food in the Last Year: Often true    Ran Out of Food in the Last Year: Sometimes true  Transportation Needs: Unmet Transportation Needs (01/27/2023)   PRAPARE - Administrator, Civil Service (Medical): Yes    Lack of Transportation (Non-Medical): Yes  Physical Activity: Inactive (02/03/2023)   Exercise Vital Sign    Days of Exercise per Week: 0 days    Minutes of Exercise per Session: 0 min  Stress:  Stress Concern Present (01/27/2023)   [employer] of Occupational Health - Occupational Stress Questionnaire    Feeling of Stress : Very much  Social Connections: Moderately Isolated (01/27/2023)   Social Connection and Isolation Panel [NHANES]    Frequency of Communication with Friends and Family: More than three times a week    Frequency of Social Gatherings with Friends and Family: Once a week    Attends Religious Services: More than 4 times per year    Active Member of [employer] or Organizations: No    Attends Engineer, structural: Not on file    Marital Status: Never married  Intimate Partner Violence: Not At Risk (02/03/2023)   Humiliation, Afraid, Rape, and Kick questionnaire    Fear of Current or Ex-Partner: No    Emotionally Abused: No    Physically Abused: No    Sexually Abused: No   Outpatient Medications Prior to Visit  Medication Sig Dispense Refill   acetaZOLAMIDE (DIAMOX) 250 MG tablet Take 4 tablets (1,000 mg total) by mouth 3 (three) times daily. 1080 tablet 3   albuterol (PROVENTIL) (2.5 MG/3ML) 0.083% nebulizer solution USE 1 VIAL VIA NEBULIZER EVERY 4 HOURS AND AS NEEDED FOR WHEEZING OR SHORTNESS OF BREATH 75 mL 1   albuterol (VENTOLIN HFA) 108 (90 Base) MCG/ACT inhaler TAKE 2 PUFFS BY MOUTH EVERY 6 HOURS AS NEEDED FOR WHEEZE OR SHORTNESS OF BREATH 8.5 each 3   AMBULATORY NON FORMULARY MEDICATION Carpal Tunnel Wrist brace. Rt  Carpal Tunnel Syndrome  G56.01  Disp 1 1 each 0   AMBULATORY NON FORMULARY MEDICATION Lace up ankle brace (ASO type)  Ankle sprain S93.491A  Disp 1 1 each 0   amLODipine (NORVASC) 10 MG tablet TAKE 1 TABLET BY MOUTH EVERY DAY 90 tablet 3   budesonide-formoterol (SYMBICORT) 160-4.5 MCG/ACT inhaler INHALE 2 PUFFS INTO LUNGS 2 TIMES DAILY AS DIRECTED 30.6 each 3   chlorthalidone (HYGROTON) 25 MG tablet TAKE 1 TABLET (25 MG TOTAL) BY MOUTH DAILY. 90 tablet 1   Cholecalciferol (VITAMIN D) 50 MCG (2000 UT) tablet Take 2,000 Units by  mouth 4 (four) times a week.     cloNIDine (CATAPRES - DOSED IN MG/24 HR) 0.3 mg/24hr patch PLACE 2 PATCHES ONTO SKIN ONCE WEEKLY 24 patch 2   clopidogrel (PLAVIX) 75 MG tablet TAKE 1 TABLET BY MOUTH EVERY DAY 90 tablet 2   clotrimazole (MYCELEX) 10 MG troche TAKE 1 TABLET BY MOUTH 5 TIMES DAILY. (Patient taking differently: Take 10 mg by mouth 5 (five) times daily as needed (thrush).) 70 Troche 5   dexlansoprazole (DEXILANT) 60 MG capsule Take 1 capsule (60 mg total) by mouth daily. 90 capsule 2   dextromethorphan-guaiFENesin (MUCINEX DM) 30-600 MG 12hr tablet Take 2 tablets by mouth 2 (two) times daily.     diltiazem (CARDIZEM CD) 360 MG 24 hr capsule Take 1 capsule (360 mg total) by mouth daily. 90 capsule 1   doxazosin (CARDURA) 8 MG tablet TAKE 2 TABLETS BY MOUTH EVERY DAY AT BEDTIME 180 tablet 1   DULoxetine (CYMBALTA) 30 MG capsule TAKE 1 CAPSULE (30 MG TOTAL) BY MOUTH DAILY. WITH 60MG  FOR TOTAL OF 90 MG (Patient taking differently: Take 30 mg by mouth See admin instructions. With 60mg  for total of 90 mg) 30 capsule 3   DULoxetine (CYMBALTA) 60 MG capsule TAKE 1 CAPSULE (60 MG TOTAL) BY MOUTH DAILY. FOR TOTAL OF 90 MG (Patient taking differently: Take 60 mg by mouth See admin instructions. Take with 30 mg for a total of 90 mg in the morning) 30 capsule 3   ENTRESTO 97-103 MG TAKE 1 TABLET BY MOUTH TWICE A DAY 60 tablet 11   EPINEPHRINE 0.3 mg/0.3 mL IJ SOAJ injection INJECT 0.3 MLS INTO THE MUSCLE ONCE AS NEEDED FOR ANAPHYLAXIS 2 Device 2   Evolocumab (REPATHA SURECLICK) 140 MG/ML SOAJ Inject 140 mg into the skin every 14 (fourteen) days. 6 mL 3   ezetimibe (ZETIA) 10 MG tablet TAKE 1 TABLET BY MOUTH EVERY DAY 90 tablet 2   famotidine (PEPCID) 20 MG tablet Take 1-2 tablets (20-40 mg total) by mouth at bedtime. (Patient taking differently: Take 20 mg by mouth 4 (four) times daily -  before meals and at bedtime.) 180 tablet 2   gabapentin (NEURONTIN) 300 MG capsule Take 1 capsule (300 mg total)  by mouth 3 (three) times daily. One three times daily (Patient taking differently: Take 300 mg by mouth 3 (three) times daily.) 90 capsule 2   hydrALAZINE (APRESOLINE) 50 MG tablet TAKE 1 TABLET BY MOUTH THREE TIMES A DAY 270 tablet 2   lidocaine-prilocaine (EMLA) cream Apply 1 application topically every 28 (twenty-eight) days. Apply 1 application to skin ever 28 days     metoCLOPramide (REGLAN) 10 MG tablet TAKE 1 TABLET BY MOUTH 4 TIMES DAILY. (Patient taking differently: Take 10 mg by mouth 4 (four) times daily -  before meals and at bedtime.) 360 tablet 0   metolazone (ZAROXOLYN) 2.5 MG tablet TAKE 1 TABLET BY MOUTH ONE TIME  PER WEEK 10 tablet 2   minoxidil (LONITEN) 2.5 MG tablet TAKE 2 TABLETS (5 MG TOTAL) BY MOUTH 2 (TWO) TIMES DAILY 360 tablet 0   montelukast (SINGULAIR) 10 MG tablet TAKE 1 TABLET BY MOUTH EVERYDAY AT BEDTIME 90 tablet 3   Nebivolol HCl 20 MG TABS TAKE 2 TABLETS BY MOUTH EVERY DAY 180 tablet 2   nitroGLYCERIN (NITROSTAT) 0.4 MG SL tablet PLACE 1 TABLET UNDER THE TONGUE EVERY 5 MINUTES AS NEEDED FOR CHEST PAIN. 25 tablet 1   oxymetazoline (MUCINEX SINUS-MAX FULL FORCE) 0.05 % nasal spray Place 1 spray into both nostrils 2 (two) times daily.     Potassium Chloride ER 20 MEQ TBCR Take 2 tablets (40 mEq total) by mouth 2 (two) times daily. 360 tablet 3   primidone (MYSOLINE) 50 MG tablet Take 0.5 tablets (25 mg total) by mouth at bedtime. 45 tablet 3   promethazine (PHENERGAN) 25 MG tablet TAKE 1 TABLET (25 MG TOTAL) BY MOUTH 3 (THREE) TIMES DAILY AS NEEDED FOR NAUSEA OR VOMITING. 270 tablet 1   promethazine-dextromethorphan (PROMETHAZINE-DM) 6.25-15 MG/5ML syrup Take 5 mLs by mouth 4 (four) times daily as needed for cough. 473 mL 2   ranolazine (RANEXA) 1000 MG SR tablet Take 1 tablet (1,000 mg total) by mouth 2 (two) times daily. 180 tablet 3   Respiratory Therapy Supplies (FLUTTER) DEVI Use as directed 1 each 0   rosuvastatin (CRESTOR) 40 MG tablet Take 1 tablet (40 mg total)  by mouth daily. 90 tablet 3   Spacer/Aero-Holding Chambers (AEROCHAMBER MV) inhaler Use as instructed 1 each 0   spironolactone (ALDACTONE) 100 MG tablet TAKE 1 TABLET BY MOUTH EVERY DAY 90 tablet 2   torsemide (DEMADEX) 20 MG tablet TAKE 2 TABLETS BY MOUTH TWICE A DAY 360 tablet 2   amitriptyline (ELAVIL) 100 MG tablet TAKE 1 TABLET BY MOUTH EVERYDAY AT BEDTIME 90 tablet 0   predniSONE (DELTASONE) 10 MG tablet 4 tabs for 2 days, then 3 tabs for 2 days, 2 tabs for 2 days, then 1 tab for 2 days, then stop 20 tablet 0   Facility-Administered Medications Prior to Visit  Medication Dose Route Frequency Provider Last Rate Last Admin   goserelin (ZOLADEX) injection 3.6 mg  3.6 mg Subcutaneous Q28 days Magrinat, Valentino Hue, MD   3.6 mg at 06/01/20 1009   Allergies  Allergen Reactions   Aspirin Shortness Of Breath and Palpitations    Pt can take ibuprofen without reaction   Bactrim [Sulfamethoxazole-Trimethoprim] Anaphylaxis    Facial, throat and tongue swelling with difficulty swallowing.   Doxycycline Anaphylaxis   Kiwi Extract Itching and Swelling    Lip swelling   Metoprolol Hives and Itching   Penicillins Shortness Of Breath and Palpitations    Has patient had a PCN reaction causing immediate rash, facial/tongue/throat swelling, SOB or lightheadedness with hypotension: Yes Has patient had a PCN reaction causing severe rash involving mucus membranes or skin necrosis: Yes Has patient had a PCN reaction that required hospitalization Yes Has patient had a PCN reaction occurring within the last 10 years: No If all of the above answers are "NO", then may proceed with Cephalosporin use.   Strawberry Extract Hives    ROS: A complete ROS was performed with pertinent positives/negatives noted in the HPI. The remainder of the ROS are negative.   Objective:   Today's Vitals   02/03/23 0858 02/03/23 0955  BP: (!) 173/81 (!) 185/94  Pulse: 84   SpO2: 99%   Weight:  215 lb 3.2 oz (97.6 kg)    Height: 5\' 5"  (1.651 m)     GENERAL: Well-appearing, in NAD. Well nourished.  SKIN: Pink, warm and dry. No rash, lesion, ulceration, or ecchymoses.  Head: Normocephalic. NECK: Trachea midline. Full ROM w/o pain or tenderness.  RESPIRATORY: Chest wall symmetrical. Respirations even and non-labored. Breath sounds clear to auscultation bilaterally.  CARDIAC: S1, S2 present, regular rate and rhythm without murmur or gallops. Peripheral pulses 2+ bilaterally.  MSK: Muscle tone and strength appropriate for age.  NEUROLOGIC: No motor or sensory deficits. Steady, even gait. C2-C12 intact.  PSYCH/MENTAL STATUS: Alert, oriented x 3. Cooperative, appropriate mood and affect.    Health Maintenance Due  Topic Date Due   Medicare Annual Wellness (AWV)  Never done   Hepatitis C Screening  Never done   Colonoscopy  Never done   Cervical Cancer Screening (HPV/Pap Cotest)  10/20/2019    Results for orders placed or performed in visit on 02/03/23  POCT glycosylated hemoglobin (Hb A1C)  Result Value Ref Range   Hemoglobin A1C 6.0 (A) 4.0 - 5.6 %   HbA1c POC (<> result, manual entry)     HbA1c, POC (prediabetic range) 6.0 5.7 - 6.4 %   HbA1c, POC (controlled diabetic range)         Assessment & Plan:  1. Screening for colon cancer Pt ss followed by Dr. Rhea Belton with [facility] and colonoscopy screening will need to be done in hospital-based setting due to patient's multiple comorbidities.  Will refer to Dr. Rhea Belton to complete this as appropriate. - Ambulatory referral to Gastroenterology  2. IFG (impaired fasting glucose) A1c improved from 6.1-6.0.  Discussed needed dietary changes to continue improvement and decrease risk of progression of type 2 diabetes.  Patient verbalized understanding and would like to try changes with diet before starting medications.  PCP did read recommend medication therapy, but will give patient 3 to 4 months to try dietary changes and repeat A1c. - POCT glycosylated  hemoglobin (Hb A1C)  3. Morbid obesity due to excess calories [facility]) Patient is currently awaiting preauthorization for possible Wegovy through cardiology given her comorbidities and morbid obesity.  Will allow cardiology to continue to manage.  4. GAD (generalized anxiety disorder) 5. Moderate episode of recurrent major depressive disorder (HCC) Discussed benefits of CBT therapy and counseling resources provided to patient.  Will increase patient's Elavil to 150 mg daily and keep Cymbalta at current dosage.  Safe use and side effects of this medication reviewed with patient and she verbalized understanding.  Safety plan reviewed follow-up in approximately 4 weeks.  6. Malignant hypertension Currently managed by cardiology, patient is scheduled for regular follow-up.  Patient to reach out to office if new, worrisome, or unresolved symptoms arise or if no improvement in patient's condition. Patient verbalized understanding and is agreeable to treatment plan. All questions answered to patient's satisfaction.    Return in about 5 weeks (around 03/10/2023) for Pap and Prediabetes Follow up .   Of note, portions of this note may have been created with voice recognition software Physicist, medical). While this note has been edited for accuracy, occasional wrong-word or 'sound-a-like' substitutions may have occurred due to the inherent limitations of voice recognition software.  Yolanda Manges, FNP

## 2023-02-04 ENCOUNTER — Inpatient Hospital Stay: Payer: 59 | Attending: Adult Health

## 2023-02-04 VITALS — BP 158/83 | HR 84 | Temp 98.2°F | Resp 18

## 2023-02-04 DIAGNOSIS — Z79818 Long term (current) use of other agents affecting estrogen receptors and estrogen levels: Secondary | ICD-10-CM | POA: Insufficient documentation

## 2023-02-04 DIAGNOSIS — Z170 Estrogen receptor positive status [ER+]: Secondary | ICD-10-CM | POA: Diagnosis not present

## 2023-02-04 DIAGNOSIS — F331 Major depressive disorder, recurrent, moderate: Secondary | ICD-10-CM | POA: Insufficient documentation

## 2023-02-04 DIAGNOSIS — C50412 Malignant neoplasm of upper-outer quadrant of left female breast: Secondary | ICD-10-CM | POA: Diagnosis present

## 2023-02-04 DIAGNOSIS — F411 Generalized anxiety disorder: Secondary | ICD-10-CM | POA: Insufficient documentation

## 2023-02-04 MED ORDER — GOSERELIN ACETATE 3.6 MG ~~LOC~~ IMPL
3.6000 mg | DRUG_IMPLANT | SUBCUTANEOUS | Status: DC
  Administered 2023-02-04: 3.6 mg via SUBCUTANEOUS
  Filled 2023-02-04: qty 3.6

## 2023-02-10 NOTE — Progress Notes (Unsigned)
   Rubin Payor, PhD, LAT, ATC acting as a scribe for Clementeen Graham, MD.  Erica [name] is a 47 y.o. female who presents to [facility] at [facility] today for foot pain. Pt was previously seen by Dr. Denyse Amass on 10/01/22 for R ankle pain.  Today, pt c/o foot pain x ***. She notes there is a "knot" ***.   Pertinent review of systems: ***  Relevant historical information: ***   Exam:  LMP  (LMP Unknown)  General: Well Developed, well nourished, and in no acute distress.   MSK: ***    Lab and Radiology Results No results found. However, due to the size of the patient record, not all encounters were searched. Please check Results Review for a complete set of results. No results found.     Assessment and Plan: 47 y.o. female with ***   PDMP not reviewed this encounter. No orders of the defined types were placed in this encounter.  No orders of the defined types were placed in this encounter.    Discussed warning signs or symptoms. Please see discharge instructions. Patient expresses understanding.   ***

## 2023-02-11 ENCOUNTER — Other Ambulatory Visit: Payer: Self-pay

## 2023-02-11 ENCOUNTER — Ambulatory Visit (INDEPENDENT_AMBULATORY_CARE_PROVIDER_SITE_OTHER): Payer: 59 | Admitting: Family Medicine

## 2023-02-11 ENCOUNTER — Other Ambulatory Visit (HOSPITAL_COMMUNITY): Payer: Self-pay

## 2023-02-11 ENCOUNTER — Ambulatory Visit (INDEPENDENT_AMBULATORY_CARE_PROVIDER_SITE_OTHER): Payer: 59

## 2023-02-11 VITALS — BP 164/80 | HR 72 | Ht 65.0 in | Wt 218.0 lb

## 2023-02-11 DIAGNOSIS — G8929 Other chronic pain: Secondary | ICD-10-CM | POA: Diagnosis not present

## 2023-02-11 DIAGNOSIS — M5416 Radiculopathy, lumbar region: Secondary | ICD-10-CM

## 2023-02-11 DIAGNOSIS — M25571 Pain in right ankle and joints of right foot: Secondary | ICD-10-CM

## 2023-02-11 DIAGNOSIS — M25561 Pain in right knee: Secondary | ICD-10-CM | POA: Diagnosis not present

## 2023-02-11 MED ORDER — GABAPENTIN 300 MG PO CAPS
300.0000 mg | ORAL_CAPSULE | Freq: Three times a day (TID) | ORAL | 2 refills | Status: DC | PRN
Start: 2023-02-11 — End: 2023-08-27

## 2023-02-11 MED ORDER — PREDNISONE 50 MG PO TABS
ORAL_TABLET | ORAL | 0 refills | Status: DC
Start: 2023-02-11 — End: 2023-03-10

## 2023-02-11 NOTE — Patient Instructions (Addendum)
Thank you for coming in today.   Please get an Xray today before you leave   I've sent a prescription for Gabapentin & Prednisone to your pharmacy.   Check back in 1 month if not better

## 2023-02-15 ENCOUNTER — Other Ambulatory Visit: Payer: Self-pay | Admitting: Cardiovascular Disease

## 2023-02-15 DIAGNOSIS — I10 Essential (primary) hypertension: Secondary | ICD-10-CM

## 2023-02-15 DIAGNOSIS — G932 Benign intracranial hypertension: Secondary | ICD-10-CM

## 2023-02-18 ENCOUNTER — Other Ambulatory Visit (HOSPITAL_COMMUNITY): Payer: Self-pay

## 2023-02-19 ENCOUNTER — Other Ambulatory Visit (HOSPITAL_BASED_OUTPATIENT_CLINIC_OR_DEPARTMENT_OTHER): Payer: Self-pay | Admitting: *Deleted

## 2023-02-19 DIAGNOSIS — Z2911 Encounter for prophylactic immunotherapy for respiratory syncytial virus (RSV): Secondary | ICD-10-CM

## 2023-02-24 NOTE — Progress Notes (Signed)
Spoke with patient today about Zoladex appts.  Asked patient to move appts to afternoons or 0730/0745 and patient refused.  Pt stated she has to wait for a friend to pick her up and will be unable to come in 15 minutes earlier.  I informed patient that we have other centers also available to give the injection that may be able to better accommodate her time constraints.  Pt stated that "I will do that."

## 2023-02-25 ENCOUNTER — Other Ambulatory Visit (HOSPITAL_COMMUNITY): Payer: Self-pay

## 2023-02-28 ENCOUNTER — Other Ambulatory Visit (HOSPITAL_COMMUNITY): Payer: Self-pay

## 2023-03-03 ENCOUNTER — Other Ambulatory Visit (HOSPITAL_COMMUNITY): Payer: Self-pay

## 2023-03-03 NOTE — Progress Notes (Signed)
Low back x-ray shows mild arthritis multiple locations in the low back.

## 2023-03-03 NOTE — Progress Notes (Signed)
Right knee x-ray shows mild arthritis especially underneath the knee joint.

## 2023-03-04 ENCOUNTER — Inpatient Hospital Stay: Payer: 59 | Attending: Adult Health

## 2023-03-04 VITALS — BP 175/85 | HR 79 | Temp 99.0°F | Resp 18

## 2023-03-04 DIAGNOSIS — Z79818 Long term (current) use of other agents affecting estrogen receptors and estrogen levels: Secondary | ICD-10-CM | POA: Insufficient documentation

## 2023-03-04 DIAGNOSIS — C50412 Malignant neoplasm of upper-outer quadrant of left female breast: Secondary | ICD-10-CM | POA: Insufficient documentation

## 2023-03-04 MED ORDER — GOSERELIN ACETATE 3.6 MG ~~LOC~~ IMPL
3.6000 mg | DRUG_IMPLANT | SUBCUTANEOUS | Status: DC
  Administered 2023-03-04: 3.6 mg via SUBCUTANEOUS
  Filled 2023-03-04: qty 3.6

## 2023-03-04 NOTE — Telephone Encounter (Signed)
Pharmacy Patient Advocate Encounter  Received notification from [facility] that Prior Authorization for wegovy has been DENIED.  See denial reason below. No denial letter attached in CMM. Will attach denial letter to Media tab once received.   PA #/Case ID/Reference #: [id number]

## 2023-03-10 ENCOUNTER — Other Ambulatory Visit (HOSPITAL_BASED_OUTPATIENT_CLINIC_OR_DEPARTMENT_OTHER): Payer: Self-pay | Admitting: Family Medicine

## 2023-03-10 ENCOUNTER — Encounter (HOSPITAL_BASED_OUTPATIENT_CLINIC_OR_DEPARTMENT_OTHER): Payer: Self-pay | Admitting: Family Medicine

## 2023-03-10 ENCOUNTER — Ambulatory Visit (INDEPENDENT_AMBULATORY_CARE_PROVIDER_SITE_OTHER): Payer: 59 | Admitting: Family Medicine

## 2023-03-10 ENCOUNTER — Other Ambulatory Visit (HOSPITAL_COMMUNITY)
Admission: RE | Admit: 2023-03-10 | Discharge: 2023-03-10 | Disposition: A | Discharge status: Home / Self Care | Payer: 59 | Source: Ambulatory Visit | Attending: Family Medicine | Admitting: Family Medicine

## 2023-03-10 VITALS — BP 172/83 | HR 99 | Ht 65.0 in | Wt 218.7 lb

## 2023-03-10 DIAGNOSIS — F411 Generalized anxiety disorder: Secondary | ICD-10-CM | POA: Diagnosis not present

## 2023-03-10 DIAGNOSIS — Z124 Encounter for screening for malignant neoplasm of cervix: Secondary | ICD-10-CM | POA: Insufficient documentation

## 2023-03-10 DIAGNOSIS — Z01419 Encounter for gynecological examination (general) (routine) without abnormal findings: Secondary | ICD-10-CM | POA: Diagnosis present

## 2023-03-10 DIAGNOSIS — F331 Major depressive disorder, recurrent, moderate: Secondary | ICD-10-CM

## 2023-03-10 DIAGNOSIS — I10 Essential (primary) hypertension: Secondary | ICD-10-CM

## 2023-03-10 DIAGNOSIS — Z1151 Encounter for screening for human papillomavirus (HPV): Secondary | ICD-10-CM | POA: Diagnosis not present

## 2023-03-10 MED ORDER — AMITRIPTYLINE HCL 100 MG PO TABS: 200.0000 mg | ORAL_TABLET | Freq: Every day | ORAL | 3 refills | Status: DC

## 2023-03-10 NOTE — Progress Notes (Addendum)
Subjective:   Erica [name] [birth year] 03/10/2023  Chief Complaint  Patient presents with   Medical Management of Chronic Issues    5-week follow up; pt is here to have follow up with pap. Denies any concerns.    HPI: Erica [name] presents today for re-assessment and management of chronic medical conditions.  PAP SMEAR:  Patient is also here for pap smear. She denies hx of abnormal pap smears. She is postmenopausal.  ANXIETY AND DEPRESSION: Erica [name] presents for the medical management of anxiety.  Current medication regimen: Elavil 150mg  every day and Cymbalta 90mg  daily  Counseling: Recommended and resources have been provided.  Well controlled: Patient reports mild improvement with increase of Elavil to 150mg . She is still very tearful and states her back pain being treated by ortho is also contributing to decreased mood and depression.  Denies SI/HI. 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 She states medications, doctors visits and chronic conditions are a large contributing factor to her current mood and outlook.      03/10/2023    9:23 AM 02/03/2023    9:39 AM 04/08/2017   12:52 PM 12/09/2016    1:52 PM  GAD 7 : Generalized Anxiety Score  Nervous, Anxious, on Edge 1 1 1 3   Control/stop worrying 2 3 1 3   Worry too much - different things 2 3 1 3   Trouble relaxing 2 2 2 3   Restless 3 3 1 3   Easily annoyed or irritable 2 2 2 3   Afraid - awful might happen 1 1 0 0  Total GAD 7 Score 13 15 8 18   Anxiety Difficulty Somewhat difficult Somewhat difficult        03/10/2023    9:23 AM 02/03/2023    9:38 AM 05/06/2019    8:42 AM 10/01/2018   11:06 AM 04/08/2017   12:52 PM  Depression screen PHQ 2/9  Decreased Interest 2 3 1 2 2   Down, Depressed, Hopeless 0 0 0 1 2  PHQ - 2 Score 2 3 1 3 4   Altered sleeping 3 3  2 2   Tired, decreased energy 3 3  1 2   Change in appetite 2 3  1 2    Feeling bad or failure about yourself  2 3  1 2   Trouble concentrating 0 0  1 2  Moving slowly or fidgety/restless 1 1  0 0  Suicidal thoughts 0 0  0 0  PHQ-9 Score 13 16  9 14   Difficult doing work/chores Somewhat difficult Somewhat difficult  Not difficult at all     The following portions of the patient's history were reviewed and updated as appropriate: past medical history, past surgical history, family history, social history, allergies, medications, and problem list.   Patient Active Problem List   Diagnosis Date Noted   Moderate episode of recurrent major depressive disorder (HCC) 02/04/2023   GAD (generalized anxiety disorder) 02/04/2023   Chronic respiratory failure with hypoxia (HCC) 12/31/2021   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   Coronary artery disease    Chronic stable angina (HCC) 08/22/2020   SVT (supraventricular tachycardia) (HCC) 08/22/2020   Palpitations 02/28/2020   Right ankle pain 04/14/2019   OSA (obstructive sleep apnea) 10/01/2018   Tibialis posterior tendinitis, left 07/15/2018   Chronic pain in left foot 06/29/2018   Vitamin D deficiency disease 03/31/2018   Persistent proteinuria 03/31/2018   Hyperlipidemia LDL goal <100  03/30/2018   Chemotherapy-induced peripheral neuropathy (HCC) 12/09/2016   Asthma in adult, mild persistent, uncomplicated 01/19/2016   Hypertensive heart disease 09/26/2015   Depression with somatization 06/15/2015   Hypertriglyceridemia without hypercholesterolemia 06/15/2015   Hypokalemia 06/15/2015   Insomnia secondary to anxiety 04/05/2015   Idiopathic intracranial hypertension 10/21/2014   Upper airway cough syndrome 10/09/2014   Malignant hypertension 10/09/2014   Morbid obesity due to excess calories (HCC) 10/09/2014   Apnea, sleep 05/04/2014   Acute on chronic diastolic CHF (congestive heart failure), NYHA class 2 (HCC) 05/03/2014   DOE (dyspnea on exertion) 07/14/2013   Acute on chronic diastolic heart failure (HCC) 06/23/2013    Malignant neoplasm of upper-outer quadrant of left breast in female, estrogen receptor negative (HCC) 01/07/2013   Gastroparesis 01/04/2013   Resistant hypertension 09/17/2011   Axillary hidradenitis suppurativa 08/01/2011   Preventative health care 04/13/2011   Hyperglycemia 04/13/2011   GERD (gastroesophageal reflux disease) 03/18/2011   Cough variant asthma 09/27/2010   Past Medical History:  Diagnosis Date   Allergy    Anxiety    Apnea, sleep 05/04/2014   AHI 31/hr now on CPAP at 12cm H2O   Arthritis    Asthma    Asthma without status asthmaticus 01/19/2016   Axillary hidradenitis suppurativa 08/01/2011   Blood transfusion without reported diagnosis    Breast cancer (HCC) 2010   a. locally advanced left breast carcinoma diagnosed in 2010 and treated with neoadjuvant chemotherapy with docetaxel and cyclophosphamide as well as paclitaxel. This was followed by radiation therapy which was completed in 2011.   Chemotherapy-induced peripheral neuropathy (HCC) 12/09/2016   CHF (congestive heart failure) (HCC)    Chronic diastolic CHF (congestive heart failure) (HCC) 06/23/2013   Chronic kidney disease    Chronic pain in left foot 06/29/2018   Chronic stable angina (HCC) 08/22/2020   Cough variant asthma 09/27/2010   Followed in Pulmonary clinic/ [facility]/ [facility]  Onset reported in childhood  - PFT's 01/31/2011 FEV1  1.76 (60% ) ratio 68 and no better p B2,  DLCO 70% -med review 09/25/2012 > [facility] drug assistance  paperwork given    - HFA 90% 11/20/2012 .  -Med calendar 12/07/12 , 04/06/2013 , 04/18/2014,, 08/30/2014 , 11/08/2014  - PFT's 01/12/2013  1.73 (65%)  Ratio 73 and no better p B2, DLCO 86% - trial off    Depression with somatization 06/15/2015   Dyspnea on exertion    a. 01/2013 Lexi MV: EF 54%, no ischemia/infarct;  b. 05/2013 Echo: EF 60-65%, no rwma, Gr 2 DD;  b.    Essential hypertension 10/09/2014   Estimated Creatinine Clearance: 82.4 mL/min (by C-G formula based on SCr  of 0.98 mg/dL).   Fatty liver    Gastroparesis    GERD (gastroesophageal reflux disease)    Hyperglycemia 04/13/2011   Hypertension    Hypertensive heart disease 09/26/2015   Hypertriglyceridemia without hypercholesterolemia 06/15/2015   Framingham risk score is 1%    Idiopathic intracranial hypertension 10/21/2014   Impaired glucose tolerance 04/13/2011   Insomnia secondary to anxiety 04/05/2015   Lymphadenitis, chronic    restricted LEFT extremity   Malignant neoplasm of upper-outer quadrant of left breast in female, estrogen receptor negative (HCC) 01/07/2013   Stage III, Invasive Ductal, s/p partial Left Mastectomy/Lumpectomy (baseball sized) with Lymph Node dissection, had Chemo and RadRx     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   OSA (obstructive sleep apnea) 05/04/2014   AHI 31/hr now on CPAP at 12cm H2O  Oxygen deficiency    Uses 02 in CPAP   Persistent proteinuria 03/31/2018   Personal history of chemotherapy 2010   Personal history of radiation therapy 2010   Resistant hypertension 09/17/2011       Sleep apnea    SVT (supraventricular tachycardia) (HCC) 08/22/2020   Tibialis posterior tendinitis, left 07/15/2018   Injected in March 10, 2019   Ulcer    Upper airway cough syndrome 10/09/2014   Onset around 2012 with assoc pseudowheeze MRI 05/18/15 > Paranasal sinuses are clear - 11/17/2015 rec increase h1 to 2 hs to help with noct cough  - Allergy profile 11/17/2015 >  Eos 0.1 /  IgE  80 POS RAST  to grass, ragweed, cedar tree  - d/c acei 03/06/2018  - flare end of Jan 2020 in setting of ? Uri/ poorly controlled GERD - flare mid Oct 2020 with uri / assoc secondary gerd   Vitamin D deficiency disease 03/31/2018   Past Surgical History:  Procedure Laterality Date   BRAIN SURGERY     Shunt   BREAST LUMPECTOMY Left 09/2008   CARPAL TUNNEL RELEASE Left 2011   LYMPH NODE DISSECTION Left 2010   breast; 2 wks after breast lumpectomy   RENAL DENERVATION N/A 11/20/2022    Procedure: RENAL DENERVATION;  Surgeon: Iran Ouch, MD;  Location: [facility] INVASIVE CV LAB;  Service: Cardiovascular;  Laterality: N/A;   RIGHT/LEFT HEART CATH AND CORONARY ANGIOGRAPHY N/A 05/04/2019   Procedure: RIGHT/LEFT HEART CATH AND CORONARY ANGIOGRAPHY;  Surgeon: Dolores Patty, MD;  Location: [facility] INVASIVE CV LAB;  Service: Cardiovascular;  Laterality: N/A;  Renal injection preformed    RIGHT/LEFT HEART CATH AND CORONARY ANGIOGRAPHY N/A 10/25/2020   Procedure: RIGHT/LEFT HEART CATH AND CORONARY ANGIOGRAPHY;  Surgeon: Marykay Lex, MD;  Location: [facility] INVASIVE CV LAB;  Service: Cardiovascular;  Laterality: N/A;   TENDON RELEASE Right 2012   "hand"   TUBAL LIGATION  05/11/2011   Procedure: POST PARTUM TUBAL LIGATION;  Surgeon: Scheryl Darter, MD;  Location: [facility];  Service: Gynecology;  Laterality: Bilateral;  Induced for HTN   Family History  Problem Relation Age of Onset   Other Mother 74       breast calcifications treated with surgery, breast cancer pill, and radiation   Diabetes Mother    Fibroids Sister 61       s/p TAH-BSO   Diabetes Maternal Grandmother    Heart disease Maternal Grandmother    Hypertension Maternal Grandmother    CAD Maternal Grandmother    Breast cancer Other        maternal great aunt (MGM's sister)   Breast cancer Other        paternal great aunt dx middle ages; s/p mastectomy   Cancer Neg Hx    Alcohol abuse Neg Hx    Early death Neg Hx    Hyperlipidemia Neg Hx    Kidney disease Neg Hx    Stroke Neg Hx    Colon cancer Neg Hx    Esophageal cancer Neg Hx    Stomach cancer Neg Hx    Rectal cancer Neg Hx    Colon polyps Neg Hx    Outpatient Medications Prior to Visit  Medication Sig Dispense Refill   acetaZOLAMIDE (DIAMOX) 250 MG tablet Take 4 tablets (1,000 mg total) by mouth 3 (three) times daily. 1080 tablet 3   albuterol (PROVENTIL) (2.5 MG/3ML) 0.083% nebulizer solution USE 1 VIAL VIA NEBULIZER EVERY 4 HOURS AND AS NEEDED FOR WHEEZING  OR  SHORTNESS OF BREATH 75 mL 1   albuterol (VENTOLIN HFA) 108 (90 Base) MCG/ACT inhaler TAKE 2 PUFFS BY MOUTH EVERY 6 HOURS AS NEEDED FOR WHEEZE OR SHORTNESS OF BREATH 8.5 each 3   AMBULATORY NON FORMULARY MEDICATION Carpal Tunnel Wrist brace. Rt  Carpal Tunnel Syndrome G56.01  Disp 1 1 each 0   AMBULATORY NON FORMULARY MEDICATION Lace up ankle brace (ASO type)  Ankle sprain S93.491A  Disp 1 1 each 0   amLODipine (NORVASC) 10 MG tablet TAKE 1 TABLET BY MOUTH EVERY DAY 90 tablet 3   budesonide-formoterol (SYMBICORT) 160-4.5 MCG/ACT inhaler INHALE 2 PUFFS INTO LUNGS 2 TIMES DAILY AS DIRECTED 30.6 each 3   chlorthalidone (HYGROTON) 25 MG tablet TAKE 1 TABLET (25 MG TOTAL) BY MOUTH DAILY. 90 tablet 1   Cholecalciferol (VITAMIN D) 50 MCG (2000 UT) tablet Take 2,000 Units by mouth 4 (four) times a week.     cloNIDine (CATAPRES - DOSED IN MG/24 HR) 0.3 mg/24hr patch PLACE 2 PATCHES ONTO SKIN ONCE WEEKLY 24 patch 2   clopidogrel (PLAVIX) 75 MG tablet TAKE 1 TABLET BY MOUTH EVERY DAY 90 tablet 2   clotrimazole (MYCELEX) 10 MG troche TAKE 1 TABLET BY MOUTH 5 TIMES DAILY. (Patient taking differently: Take 10 mg by mouth 5 (five) times daily as needed (thrush).) 70 Troche 5   dexlansoprazole (DEXILANT) 60 MG capsule Take 1 capsule (60 mg total) by mouth daily. 90 capsule 2   dextromethorphan-guaiFENesin (MUCINEX DM) 30-600 MG 12hr tablet Take 2 tablets by mouth 2 (two) times daily.     diltiazem (CARDIZEM CD) 360 MG 24 hr capsule Take 1 capsule (360 mg total) by mouth daily. 90 capsule 1   doxazosin (CARDURA) 8 MG tablet TAKE 2 TABLETS BY MOUTH EVERY DAY AT BEDTIME 180 tablet 1   DULoxetine (CYMBALTA) 30 MG capsule TAKE 1 CAPSULE (30 MG TOTAL) BY MOUTH DAILY. WITH 60MG  FOR TOTAL OF 90 MG (Patient taking differently: Take 30 mg by mouth See admin instructions. With 60mg  for total of 90 mg) 30 capsule 3   DULoxetine (CYMBALTA) 60 MG capsule TAKE 1 CAPSULE (60 MG TOTAL) BY MOUTH DAILY. FOR TOTAL OF 90 MG  (Patient taking differently: Take 60 mg by mouth See admin instructions. Take with 30 mg for a total of 90 mg in the morning) 30 capsule 3   ENTRESTO 97-103 MG TAKE 1 TABLET BY MOUTH TWICE A DAY 60 tablet 11   EPINEPHRINE 0.3 mg/0.3 mL IJ SOAJ injection INJECT 0.3 MLS INTO THE MUSCLE ONCE AS NEEDED FOR ANAPHYLAXIS 2 Device 2   Evolocumab (REPATHA SURECLICK) 140 MG/ML SOAJ Inject 140 mg into the skin every 14 (fourteen) days. 6 mL 3   ezetimibe (ZETIA) 10 MG tablet TAKE 1 TABLET BY MOUTH EVERY DAY 90 tablet 2   famotidine (PEPCID) 20 MG tablet Take 1-2 tablets (20-40 mg total) by mouth at bedtime. (Patient taking differently: Take 20 mg by mouth 4 (four) times daily -  before meals and at bedtime.) 180 tablet 2   gabapentin (NEURONTIN) 300 MG capsule Take 1 capsule (300 mg total) by mouth 3 (three) times daily. One three times daily (Patient taking differently: Take 300 mg by mouth 3 (three) times daily.) 90 capsule 2   gabapentin (NEURONTIN) 300 MG capsule Take 1 capsule (300 mg total) by mouth 3 (three) times daily as needed. 90 capsule 2   hydrALAZINE (APRESOLINE) 50 MG tablet TAKE 1 TABLET BY MOUTH THREE TIMES A DAY 270 tablet  2   lidocaine-prilocaine (EMLA) cream Apply 1 application topically every 28 (twenty-eight) days. Apply 1 application to skin ever 28 days     metoCLOPramide (REGLAN) 10 MG tablet TAKE 1 TABLET BY MOUTH 4 TIMES DAILY. (Patient taking differently: Take 10 mg by mouth 4 (four) times daily -  before meals and at bedtime.) 360 tablet 0   metolazone (ZAROXOLYN) 2.5 MG tablet TAKE 1 TABLET BY MOUTH ONE TIME PER WEEK 10 tablet 2   minoxidil (LONITEN) 2.5 MG tablet TAKE 2 TABLETS (5 MG TOTAL) BY MOUTH 2 (TWO) TIMES DAILY 360 tablet 0   montelukast (SINGULAIR) 10 MG tablet TAKE 1 TABLET BY MOUTH EVERYDAY AT BEDTIME 90 tablet 3   Nebivolol HCl 20 MG TABS TAKE 2 TABLETS BY MOUTH EVERY DAY 180 tablet 2   nitroGLYCERIN (NITROSTAT) 0.4 MG SL tablet PLACE 1 TABLET UNDER THE TONGUE EVERY 5  MINUTES AS NEEDED FOR CHEST PAIN. 25 tablet 1   oxymetazoline (MUCINEX SINUS-MAX FULL FORCE) 0.05 % nasal spray Place 1 spray into both nostrils 2 (two) times daily.     Potassium Chloride ER 20 MEQ TBCR Take 2 tablets (40 mEq total) by mouth 2 (two) times daily. 360 tablet 3   primidone (MYSOLINE) 50 MG tablet Take 0.5 tablets (25 mg total) by mouth at bedtime. 45 tablet 3   promethazine (PHENERGAN) 25 MG tablet TAKE 1 TABLET (25 MG TOTAL) BY MOUTH 3 (THREE) TIMES DAILY AS NEEDED FOR NAUSEA OR VOMITING. 270 tablet 1   promethazine-dextromethorphan (PROMETHAZINE-DM) 6.25-15 MG/5ML syrup Take 5 mLs by mouth 4 (four) times daily as needed for cough. 473 mL 2   ranolazine (RANEXA) 1000 MG SR tablet Take 1 tablet (1,000 mg total) by mouth 2 (two) times daily. 180 tablet 3   Respiratory Therapy Supplies (FLUTTER) DEVI Use as directed 1 each 0   rosuvastatin (CRESTOR) 40 MG tablet Take 1 tablet (40 mg total) by mouth daily. 90 tablet 3   Spacer/Aero-Holding Chambers (AEROCHAMBER MV) inhaler Use as instructed 1 each 0   spironolactone (ALDACTONE) 100 MG tablet TAKE 1 TABLET BY MOUTH EVERY DAY 90 tablet 2   torsemide (DEMADEX) 20 MG tablet TAKE 2 TABLETS BY MOUTH TWICE A DAY 360 tablet 2   amitriptyline (ELAVIL) 150 MG tablet Take 1 tablet (150 mg total) by mouth at bedtime. 90 tablet 3   predniSONE (DELTASONE) 50 MG tablet Take 1 pill daily for 5 days 5 tablet 0   Facility-Administered Medications Prior to Visit  Medication Dose Route Frequency Provider Last Rate Last Admin   goserelin (ZOLADEX) injection 3.6 mg  3.6 mg Subcutaneous Q28 days Magrinat, Valentino Hue, MD   3.6 mg at 06/01/20 1009   Allergies  Allergen Reactions   Aspirin Shortness Of Breath and Palpitations    Pt can take ibuprofen without reaction   Bactrim [Sulfamethoxazole-Trimethoprim] Anaphylaxis    Facial, throat and tongue swelling with difficulty swallowing.   Doxycycline Anaphylaxis   Kiwi Extract Itching and Swelling    Lip  swelling   Metoprolol Hives and Itching   Penicillins Shortness Of Breath and Palpitations    Has patient had a PCN reaction causing immediate rash, facial/tongue/throat swelling, SOB or lightheadedness with hypotension: Yes Has patient had a PCN reaction causing severe rash involving mucus membranes or skin necrosis: Yes Has patient had a PCN reaction that required hospitalization Yes Has patient had a PCN reaction occurring within the last 10 years: No If all of the above answers are "NO", then may  proceed with Cephalosporin use.   Strawberry Extract Hives     ROS: A complete ROS was performed with pertinent positives/negatives noted in the HPI. The remainder of the ROS are negative.    Objective:   Today's Vitals   03/10/23 0914  BP: (!) 172/83  Pulse: 99  SpO2: 97%  Weight: 218 lb 11.2 oz (99.2 kg)  Height: 5\' 5"  (1.651 m)    Physical Exam          GENERAL: Well-appearing, in NAD. Well nourished.  SKIN: Pink, warm and dry. No rash, lesion, ulceration, or ecchymoses.  Head: Normocephalic. NECK: Trachea midline. Full ROM w/o pain or tenderness.  RESPIRATORY: Chest wall symmetrical. Respirations even and non-labored.  NEUROLOGIC: No motor or sensory deficits. Steady, even gait. C2-C12 intact.  PSYCH/MENTAL STATUS: Alert, oriented x 3. Cooperative, tearful mood and flat affect.     Assessment & Plan:  1. Cervical cancer screening Pap completed in office today. Pt declined STD testing and breast exam. Will notify of results when available.  - Cytology - PAP  2. Moderate episode of recurrent major depressive disorder (HCC) 3. GAD (generalized anxiety disorder) Discussed benefits of CBT and resources provided. Will increase Elavil to 200mg  daily and continue Cymbalta as prescribed. Safety plan and side effects reviewed with pt and she verbalized understanding. Will follow up in 3 months or sooner if needed.   4. Malignant hypertension Chronically uncontrolled. Managed by  Cardiology. Pt has upcoming appt with Joylene Igo, NP Cardiology for titration.    Meds ordered this encounter  Medications   amitriptyline (ELAVIL) 100 MG tablet    Sig: Take 2 tablets (200 mg total) by mouth at bedtime.    Dispense:  60 tablet    Refill:  3    Order Specific Question:   Supervising Provider    Answer:   DE Peru, RAYMOND J [[id number]]    Return in about 3 months (around 06/08/2023) for Follow up Prediabetes, Anxiety  (A1C at office) .    Patient to reach out to office if new, worrisome, or unresolved symptoms arise or if no improvement in patient's condition. Patient verbalized understanding and is agreeable to treatment plan. All questions answered to patient's satisfaction.    Hilbert Bible, [place]

## 2023-03-10 NOTE — Patient Instructions (Signed)
COUNSELING AGENCIES in [facility] [phone] [address] [place], [place] [address] Urgent Care Services (ages 49 yo and up, available 24/7) Outpatient Counseling & Psychiatry (accepts people with no insurance, available during business hours)  Mental Health- Accepts Medicaid  (* = Spanish available;  + = Psychiatric services) * [facility]                            [phone]  Walk in 9am-1pm Virtual & Onsite  *+ [facility]:                                     [phone] or [phone] Virtual & Onsite  [facility]:                                              [phone] Virtual & Onsite  + [facility]:                                         [phone] Virtual & Onsite  * [facility]:                                                   [phone]   [facility]                                                    [phone] Virtual & Onsite  [facility]           [phone] Virtual   [facility]:                                                           [phone] Virtual & Onsite  [facility]:                                      [phone] Virtual & Onsite  [facility]:                            [phone]   *[facility]                                                [phone] Virtual & Onsite  + [facility]:             [phone] or [phone]   *  [facility]                                                 [phone] Virtual & Onsite    Website to Find a Therapist:       [email or web address]

## 2023-03-11 ENCOUNTER — Encounter (HOSPITAL_BASED_OUTPATIENT_CLINIC_OR_DEPARTMENT_OTHER): Payer: Self-pay | Admitting: Family Medicine

## 2023-03-11 ENCOUNTER — Telehealth (HOSPITAL_BASED_OUTPATIENT_CLINIC_OR_DEPARTMENT_OTHER): Payer: Self-pay | Admitting: *Deleted

## 2023-03-11 LAB — CYTOLOGY - PAP
Comment: NEGATIVE
Diagnosis: NEGATIVE
High risk HPV: NEGATIVE

## 2023-03-11 NOTE — Telephone Encounter (Signed)
Copied from CRM [id number]. Topic: Clinical - Medication Question >> Mar 11, 2023  1:22 PM Tiffany H wrote: Reason for CRM: Patient called back in response to call out from [facility] FNP regarding Elavil prescription. Relayed MyCHart message. Patient advised that she understands and will accept a Wellbutron add-on. Please assist.

## 2023-03-11 NOTE — Progress Notes (Signed)
Pap Smear is normal. Health maintenance updated. Repeat pap in 3 years.

## 2023-03-12 ENCOUNTER — Telehealth (HOSPITAL_BASED_OUTPATIENT_CLINIC_OR_DEPARTMENT_OTHER): Payer: Self-pay | Admitting: *Deleted

## 2023-03-12 NOTE — Telephone Encounter (Signed)
Please advise 

## 2023-03-12 NOTE — Telephone Encounter (Signed)
Copied from CRM [id number]. Topic: Clinical - Prescription Issue >> Mar 12, 2023 10:26 AM Lorin Glass B wrote: Reason for CRM: [facility] called stating that patients rx amitriptyline (ELAVIL) 100 MG tablet twice daily is a higher dosage than manufacturer recommends which is 150mg  total daily. States that they will need doctors permission to proceed or to change dosage. Callback [phone]

## 2023-03-13 NOTE — Telephone Encounter (Signed)
Called and spoke with pt letting her know about the mychart message and she verbalized understanding. Nothing further needed.

## 2023-03-13 NOTE — Telephone Encounter (Signed)
Called and spoke with pt letting her know about the [employer] and information from [facility] and she verbalized understanding. Nothing further needed.

## 2023-03-14 ENCOUNTER — Encounter (HOSPITAL_BASED_OUTPATIENT_CLINIC_OR_DEPARTMENT_OTHER): Payer: Self-pay | Admitting: *Deleted

## 2023-03-14 ENCOUNTER — Ambulatory Visit (HOSPITAL_BASED_OUTPATIENT_CLINIC_OR_DEPARTMENT_OTHER): Payer: 59 | Admitting: *Deleted

## 2023-03-14 VITALS — Ht 65.0 in | Wt 218.7 lb

## 2023-03-14 DIAGNOSIS — Z0000 Encounter for general adult medical examination without abnormal findings: Secondary | ICD-10-CM | POA: Diagnosis not present

## 2023-03-14 NOTE — Progress Notes (Signed)
Subjective:   Erica [name] is a 47 y.o. female who presents for an Initial Medicare Annual Wellness Visit.  Visit Complete: Virtual I connected with  Erica [name] on 03/14/23 by a audio enabled telemedicine application and verified that I am speaking with the correct person using two identifiers.  Patient Location: Home  Provider Location: Home Office  I discussed the limitations of evaluation and management by telemedicine. The patient expressed understanding and agreed to proceed.  Vital Signs: Because this visit was a virtual/telehealth visit, some criteria may be missing or patient reported. Any vitals not documented were not able to be obtained and vitals that have been documented are patient reported.  Patient Medicare AWV questionnaire was completed by the patient on 03/14/23; I have confirmed that all information answered by patient is correct and no changes since this date.  Cardiac Risk Factors include: sedentary lifestyle;hypertension;obesity (BMI >30kg/m2)     Objective:    Today's Vitals   03/14/23 1029  Weight: 218 lb 11.2 oz (99.2 kg)  Height: 5\' 5"  (1.651 m)  PainSc: 8    Body mass index is 36.39 kg/m.     03/14/2023   10:24 AM 12/25/2022   10:37 AM 12/10/2022    9:11 AM 11/20/2022   10:51 AM 09/21/2022   10:03 AM 10/25/2020   10:49 AM 08/23/2020    8:03 AM  Advanced Directives  Does Patient Have a Medical Advance Directive? Yes No No Yes No Yes Yes  Type of Advance Directive Living will   Healthcare Power of [place];Living will  Healthcare Power of [place];Living will Healthcare Power of [place];Living will  Does patient want to make changes to medical advance directive? No - Guardian declined     No - Patient declined   Copy of Healthcare Power of Attorney in Chart?      No - copy requested     Current Medications (verified) Outpatient Encounter Medications as of 03/14/2023  Medication Sig   acetaZOLAMIDE (DIAMOX) 250 MG tablet Take 4 tablets  (1,000 mg total) by mouth 3 (three) times daily.   albuterol (PROVENTIL) (2.5 MG/3ML) 0.083% nebulizer solution USE 1 VIAL VIA NEBULIZER EVERY 4 HOURS AND AS NEEDED FOR WHEEZING OR SHORTNESS OF BREATH   albuterol (VENTOLIN HFA) 108 (90 Base) MCG/ACT inhaler TAKE 2 PUFFS BY MOUTH EVERY 6 HOURS AS NEEDED FOR WHEEZE OR SHORTNESS OF BREATH   AMBULATORY NON FORMULARY MEDICATION Carpal Tunnel Wrist brace. Rt  Carpal Tunnel Syndrome G56.01  Disp 1   AMBULATORY NON FORMULARY MEDICATION Lace up ankle brace (ASO type)  Ankle sprain S93.491A  Disp 1   amitriptyline (ELAVIL) 100 MG tablet Take 2 tablets (200 mg total) by mouth at bedtime.   amLODipine (NORVASC) 10 MG tablet TAKE 1 TABLET BY MOUTH EVERY DAY   budesonide-formoterol (SYMBICORT) 160-4.5 MCG/ACT inhaler INHALE 2 PUFFS INTO LUNGS 2 TIMES DAILY AS DIRECTED   chlorthalidone (HYGROTON) 25 MG tablet TAKE 1 TABLET (25 MG TOTAL) BY MOUTH DAILY.   Cholecalciferol (VITAMIN D) 50 MCG (2000 UT) tablet Take 2,000 Units by mouth 4 (four) times a week.   cloNIDine (CATAPRES - DOSED IN MG/24 HR) 0.3 mg/24hr patch PLACE 2 PATCHES ONTO SKIN ONCE WEEKLY   clopidogrel (PLAVIX) 75 MG tablet TAKE 1 TABLET BY MOUTH EVERY DAY   clotrimazole (MYCELEX) 10 MG troche TAKE 1 TABLET BY MOUTH 5 TIMES DAILY. (Patient taking differently: Take 10 mg by mouth 5 (five) times daily as needed (thrush).)   dexlansoprazole (DEXILANT) 60  MG capsule Take 1 capsule (60 mg total) by mouth daily.   dextromethorphan-guaiFENesin (MUCINEX DM) 30-600 MG 12hr tablet Take 2 tablets by mouth 2 (two) times daily.   diltiazem (CARDIZEM CD) 360 MG 24 hr capsule Take 1 capsule (360 mg total) by mouth daily.   doxazosin (CARDURA) 8 MG tablet TAKE 2 TABLETS BY MOUTH EVERY DAY AT BEDTIME   DULoxetine (CYMBALTA) 30 MG capsule TAKE 1 CAPSULE (30 MG TOTAL) BY MOUTH DAILY. WITH 60MG  FOR TOTAL OF 90 MG (Patient taking differently: Take 30 mg by mouth See admin instructions. With 60mg  for total of 90 mg)    DULoxetine (CYMBALTA) 60 MG capsule TAKE 1 CAPSULE (60 MG TOTAL) BY MOUTH DAILY. FOR TOTAL OF 90 MG (Patient taking differently: Take 60 mg by mouth See admin instructions. Take with 30 mg for a total of 90 mg in the morning)   ENTRESTO 97-103 MG TAKE 1 TABLET BY MOUTH TWICE A DAY   EPINEPHRINE 0.3 mg/0.3 mL IJ SOAJ injection INJECT 0.3 MLS INTO THE MUSCLE ONCE AS NEEDED FOR ANAPHYLAXIS   Evolocumab (REPATHA SURECLICK) 140 MG/ML SOAJ Inject 140 mg into the skin every 14 (fourteen) days.   ezetimibe (ZETIA) 10 MG tablet TAKE 1 TABLET BY MOUTH EVERY DAY   famotidine (PEPCID) 20 MG tablet Take 1-2 tablets (20-40 mg total) by mouth at bedtime. (Patient taking differently: Take 20 mg by mouth 4 (four) times daily -  before meals and at bedtime.)   gabapentin (NEURONTIN) 300 MG capsule Take 1 capsule (300 mg total) by mouth 3 (three) times daily. One three times daily (Patient taking differently: Take 300 mg by mouth 3 (three) times daily.)   gabapentin (NEURONTIN) 300 MG capsule Take 1 capsule (300 mg total) by mouth 3 (three) times daily as needed.   hydrALAZINE (APRESOLINE) 50 MG tablet TAKE 1 TABLET BY MOUTH THREE TIMES A DAY   lidocaine-prilocaine (EMLA) cream Apply 1 application topically every 28 (twenty-eight) days. Apply 1 application to skin ever 28 days   metoCLOPramide (REGLAN) 10 MG tablet TAKE 1 TABLET BY MOUTH 4 TIMES DAILY. (Patient taking differently: Take 10 mg by mouth 4 (four) times daily -  before meals and at bedtime.)   metolazone (ZAROXOLYN) 2.5 MG tablet TAKE 1 TABLET BY MOUTH ONE TIME PER WEEK   minoxidil (LONITEN) 2.5 MG tablet TAKE 2 TABLETS (5 MG TOTAL) BY MOUTH 2 (TWO) TIMES DAILY   montelukast (SINGULAIR) 10 MG tablet TAKE 1 TABLET BY MOUTH EVERYDAY AT BEDTIME   Nebivolol HCl 20 MG TABS TAKE 2 TABLETS BY MOUTH EVERY DAY   nitroGLYCERIN (NITROSTAT) 0.4 MG SL tablet PLACE 1 TABLET UNDER THE TONGUE EVERY 5 MINUTES AS NEEDED FOR CHEST PAIN.   oxymetazoline (MUCINEX SINUS-MAX  FULL FORCE) 0.05 % nasal spray Place 1 spray into both nostrils 2 (two) times daily.   Potassium Chloride ER 20 MEQ TBCR Take 2 tablets (40 mEq total) by mouth 2 (two) times daily.   primidone (MYSOLINE) 50 MG tablet Take 0.5 tablets (25 mg total) by mouth at bedtime.   promethazine (PHENERGAN) 25 MG tablet TAKE 1 TABLET (25 MG TOTAL) BY MOUTH 3 (THREE) TIMES DAILY AS NEEDED FOR NAUSEA OR VOMITING.   promethazine-dextromethorphan (PROMETHAZINE-DM) 6.25-15 MG/5ML syrup Take 5 mLs by mouth 4 (four) times daily as needed for cough.   ranolazine (RANEXA) 1000 MG SR tablet Take 1 tablet (1,000 mg total) by mouth 2 (two) times daily.   Respiratory Therapy Supplies (FLUTTER) DEVI Use as directed  rosuvastatin (CRESTOR) 40 MG tablet Take 1 tablet (40 mg total) by mouth daily.   Spacer/Aero-Holding Chambers (AEROCHAMBER MV) inhaler Use as instructed   spironolactone (ALDACTONE) 100 MG tablet TAKE 1 TABLET BY MOUTH EVERY DAY   torsemide (DEMADEX) 20 MG tablet TAKE 2 TABLETS BY MOUTH TWICE A DAY   Facility-Administered Encounter Medications as of 03/14/2023  Medication   goserelin (ZOLADEX) injection 3.6 mg    Allergies (verified) Aspirin, Bactrim [sulfamethoxazole-trimethoprim], Doxycycline, Kiwi extract, Metoprolol, Penicillins, and Strawberry extract   History: Past Medical History:  Diagnosis Date   Allergy    Anxiety    Apnea, sleep 05/04/2014   AHI 31/hr now on CPAP at 12cm H2O   Arthritis    Asthma    Asthma without status asthmaticus 01/19/2016   Axillary hidradenitis suppurativa 08/01/2011   Blood transfusion without reported diagnosis    Breast cancer (HCC) 2010   a. locally advanced left breast carcinoma diagnosed in 2010 and treated with neoadjuvant chemotherapy with docetaxel and cyclophosphamide as well as paclitaxel. This was followed by radiation therapy which was completed in 2011.   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  -Med calendar 12/07/12 , 04/06/2013 , 04/18/2014,, 08/30/2014 , 11/08/2014  - PFT's 01/12/2013  1.73 (65%)  Ratio 73 and no better p B2, DLCO 86% - trial off    Depression with somatization 06/15/2015   Dyspnea on exertion    a. 01/2013 Lexi MV: EF 54%, no ischemia/infarct;  b. 05/2013 Echo: EF 60-65%, no rwma, Gr 2 DD;  b.    Essential hypertension 10/09/2014   Estimated Creatinine Clearance: 82.4 mL/min (by C-G formula based on SCr of 0.98 mg/dL).   Fatty liver    Gastroparesis    GERD (gastroesophageal reflux disease)    Hyperglycemia 04/13/2011   Hypertension    Hypertensive heart disease 09/26/2015   Hypertriglyceridemia without hypercholesterolemia 06/15/2015   Framingham risk score is 1%    Idiopathic intracranial hypertension 10/21/2014   Impaired glucose tolerance 04/13/2011   Insomnia secondary to anxiety 04/05/2015   Lymphadenitis, chronic    restricted LEFT extremity   Malignant neoplasm of upper-outer quadrant of left breast in female, estrogen receptor negative (HCC) 01/07/2013   Stage III, Invasive Ductal, s/p partial Left Mastectomy/Lumpectomy (baseball sized) with Lymph Node dissection, had Chemo and RadRx     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   OSA (obstructive sleep apnea) 05/04/2014   AHI 31/hr now on CPAP at 12cm H2O   Oxygen deficiency    Uses 02 in CPAP   Persistent proteinuria 03/31/2018   Personal history of chemotherapy 2010   Personal history of radiation therapy 2010   Resistant hypertension  09/17/2011       Sleep apnea    SVT (supraventricular tachycardia) (HCC) 08/22/2020   Tibialis posterior tendinitis, left 07/15/2018   Injected in March 10, 2019   Ulcer    Upper airway cough syndrome 10/09/2014   Onset around  2012 with assoc pseudowheeze MRI 05/18/15 > Paranasal sinuses are clear - 11/17/2015 rec increase h1 to 2 hs to help with noct cough  - Allergy profile 11/17/2015 >  Eos 0.1 /  IgE  80 POS RAST  to grass, ragweed, cedar tree  - d/c acei 03/06/2018  - flare end of Jan 2020 in setting of ? Uri/ poorly controlled GERD - flare mid Oct 2020 with uri / assoc secondary gerd   Vitamin D deficiency disease 03/31/2018   Past Surgical History:  Procedure Laterality Date   BRAIN SURGERY     [place]   BREAST LUMPECTOMY Left 09/2008   CARPAL TUNNEL RELEASE Left 2011   LYMPH NODE DISSECTION Left 2010   breast; 2 wks after breast lumpectomy   RENAL DENERVATION N/A 11/20/2022   Procedure: RENAL DENERVATION;  Surgeon: Iran Ouch, MD;  Location: [facility] INVASIVE CV LAB;  Service: Cardiovascular;  Laterality: N/A;   RIGHT/LEFT HEART CATH AND CORONARY ANGIOGRAPHY N/A 05/04/2019   Procedure: RIGHT/LEFT HEART CATH AND CORONARY ANGIOGRAPHY;  Surgeon: Dolores Patty, MD;  Location: [facility] INVASIVE CV LAB;  Service: Cardiovascular;  Laterality: N/A;  Renal injection preformed    RIGHT/LEFT HEART CATH AND CORONARY ANGIOGRAPHY N/A 10/25/2020   Procedure: RIGHT/LEFT HEART CATH AND CORONARY ANGIOGRAPHY;  Surgeon: Marykay Lex, MD;  Location: [facility] INVASIVE CV LAB;  Service: Cardiovascular;  Laterality: N/A;   TENDON RELEASE Right 2012   "hand"   TUBAL LIGATION  05/11/2011   Procedure: POST PARTUM TUBAL LIGATION;  Surgeon: Scheryl Darter, MD;  Location: [facility];  Service: Gynecology;  Laterality: Bilateral;  Induced for HTN   Family History  Problem Relation Age of Onset   Other Mother 82       breast calcifications treated with surgery, breast cancer pill, and radiation   Diabetes Mother     Fibroids Sister 9       s/p TAH-BSO   Diabetes Maternal Grandmother    Heart disease Maternal Grandmother    Hypertension Maternal Grandmother    CAD Maternal Grandmother    Breast cancer Other        maternal great aunt (MGM's sister)   Breast cancer Other        paternal great aunt dx middle ages; s/p mastectomy   Cancer Neg Hx    Alcohol abuse Neg Hx    Early death Neg Hx    Hyperlipidemia Neg Hx    Kidney disease Neg Hx    Stroke Neg Hx    Colon cancer Neg Hx    Esophageal cancer Neg Hx    Stomach cancer Neg Hx    Rectal cancer Neg Hx    Colon polyps Neg Hx    Social History   Socioeconomic History   Marital status: Single    Spouse name: Not on file   Number of children: 3   Years of education: Not on file   Highest education level: Bachelor's degree (e.g., BA, AB, BS)  Occupational History    Employer: UNEMPLOYED  Tobacco Use   Smoking status: Never   Smokeless tobacco: Never  Vaping Use   Vaping status: Never Used  Substance and Sexual Activity   Alcohol use: Yes    Comment: occasionally/socially   Drug use: No   Sexual activity: Yes    Birth control/protection: Surgical, None  Other Topics Concern   Not on file  Social History Narrative   She lives with four children.   She is currently not working (  disbaility pending).   Left handed   One story home   Drinks caffeine      Social Drivers of Health   Financial Resource Strain: High Risk (01/27/2023)   Overall Financial Resource Strain (CARDIA)    Difficulty of Paying Living Expenses: Very hard  Food Insecurity: Food Insecurity Present (01/27/2023)   Hunger Vital Sign    Worried About Running Out of Food in the Last Year: Often true    Ran Out of Food in the Last Year: Sometimes true  Transportation Needs: Unmet Transportation Needs (01/27/2023)   PRAPARE - Administrator, Civil Service (Medical): Yes    Lack of Transportation (Non-Medical): Yes  Physical Activity: Inactive  (02/03/2023)   Exercise Vital Sign    Days of Exercise per Week: 0 days    Minutes of Exercise per Session: 0 min  Stress: Stress Concern Present (01/27/2023)   [employer] of Occupational Health - Occupational Stress Questionnaire    Feeling of Stress : Very much  Social Connections: Moderately Isolated (01/27/2023)   Social Connection and Isolation Panel [NHANES]    Frequency of Communication with Friends and Family: More than three times a week    Frequency of Social Gatherings with Friends and Family: Once a week    Attends Religious Services: More than 4 times per year    Active Member of [employer] or Organizations: No    Attends Engineer, structural: Not on file    Marital Status: Never married    Tobacco Counseling Counseling given: Not Answered   Clinical Intake:  Pre-visit preparation completed: Yes  Pain : 0-10 Pain Score: 8  Pain Type: Chronic pain Pain Location: Leg Pain Orientation: Right Pain Radiating Towards: pain in leg will go all the way to her knee cap Pain Descriptors / Indicators: Shooting Pain Onset: More than a month ago Pain Frequency: Constant Pain Relieving Factors: nothing helps with the pain Effect of Pain on Daily Activities: patient is unable to do a lot of daily activities and will spend majority of time on stomach to try to alleviate the pain  Pain Relieving Factors: nothing helps with the pain  BMI - recorded: 36.39 Nutritional Status: BMI > 30  Obese Nutritional Risks: None Diabetes: No (prediabetic)  How often do you need to have someone help you when you read instructions, pamphlets, or other written materials from your doctor or pharmacy?: 1 - Never What is the last grade level you completed in school?: Bachelor's Degree in Political Science  Interpreter Needed?: No  Information entered by :: Cristy Hilts, CMA   Activities of Daily Living    03/14/2023   10:33 AM  In your present state of health, do you have any  difficulty performing the following activities:  Hearing? 0  Vision? 1  Difficulty concentrating or making decisions? 1  Walking or climbing stairs? 1  Dressing or bathing? 1  Comment sometimes if hands are shaky, cannot tie shoes or do buttons  Doing errands, shopping? 0  Preparing Food and eating ? N  Using the Toilet? N  In the past six months, have you accidently leaked urine? Y  Do you have problems with loss of bowel control? N  Managing your Medications? N  Managing your Finances? N  Housekeeping or managing your Housekeeping? Y    Patient Care Team: Hilbert Bible, FNP as PCP - General (Family Medicine) Chilton Si, MD as PCP - Cardiology (Cardiology) Margette Fast, PA-C (Neurology) Sandrea Hughs  B, MD as Consulting Physician (Pulmonary Disease) Hermina Staggers, MD (Inactive) as Consulting Physician (Obstetrics and Gynecology) Glendale Chard, DO as Consulting Physician (Neurology) Pyrtle, Carie Caddy, MD as Consulting Physician (Gastroenterology) Axel Filler, Larna Daughters, NP as Nurse Practitioner (Hematology and Oncology) Dutch Quint B, RRT (Cardiology)  Indicate any recent Medical Services you may have received from other than [facility] providers in the past year (date may be approximate).     Assessment:   This is a routine wellness examination for [place].  Hearing/Vision screen No results found.   Goals Addressed               This Visit's Progress     Blood Pressure < 140/90        Weight (lb) < 200 lb (90.7 kg) (pt-stated)   218 lb 11.2 oz (99.2 kg)     Depression Screen    03/14/2023   10:24 AM 03/10/2023    9:23 AM 02/03/2023    9:38 AM 05/06/2019    8:42 AM 10/01/2018   11:06 AM 04/08/2017   12:52 PM 02/03/2017    1:08 PM  PHQ 2/9 Scores  PHQ - 2 Score 2 2 3 1 3 4  0  PHQ- 9 Score 13 13 16  9 14      Fall Risk    03/10/2023    9:23 AM 02/03/2023    9:41 AM 07/19/2019    9:01 AM 10/01/2018   11:06 AM 07/02/2018    9:07 AM  Fall Risk    Falls in the past year? 1 1 1  0 1  Number falls in past yr: 1 1 1  0 1  Injury with Fall? 1 1 1  0 0  Risk for fall due to : History of fall(s);Medication side effect;Impaired balance/gait;Impaired mobility History of fall(s);Medication side effect;Impaired balance/gait;Impaired mobility   Impaired balance/gait;Impaired mobility  Follow up Falls evaluation completed Falls evaluation completed  Falls evaluation completed Falls evaluation completed;Education provided;Falls prevention discussed    MEDICARE RISK AT HOME: Medicare Risk at Home Any stairs in or around the home?: Yes If so, are there any without handrails?: No Home free of loose throw rugs in walkways, pet beds, electrical cords, etc?: Yes Adequate lighting in your home to reduce risk of falls?: Yes Life alert?: No Use of a cane, walker or w/c?: No Grab bars in the bathroom?: No Shower chair or bench in shower?: Yes Elevated toilet seat or a handicapped toilet?: No      Cognitive Function:        03/14/2023   10:27 AM  6CIT Screen  What Year? 0 points  What month? 0 points  What time? 0 points  Count back from 20 0 points  Months in reverse 0 points  Repeat phrase 0 points  Total Score 0 points    Immunizations Immunization History  Administered Date(s) Administered   Influenza Split 12/31/2010, 03/02/2012, 12/01/2015   Influenza, Seasonal, Injecte, Preservative Fre 01/03/2023   Influenza,inj,Quad PF,6+ Mos 02/09/2013, 02/02/2014, 12/06/2014, 12/05/2015, 02/04/2017, 03/06/2018, 12/04/2018, 12/31/2021   Influenza-Unspecified 01/10/2021   Moderna Covid-19 Vaccine Bivalent Booster 48yrs & up 03/01/2021   Pneumococcal Conjugate-13 12/01/2014   Pneumococcal Polysaccharide-23 03/02/2012, 12/07/2020   Tdap 05/12/2011, 12/06/2021    TDAP status: Up to date  Flu Vaccine status: Up to date  Pneumococcal vaccine status: Up to date  Covid-19 vaccine status: Completed vaccines  Qualifies for Shingles Vaccine?  No    Screening Tests Health Maintenance  Topic Date Due   Colonoscopy  Never done   COVID-19 Vaccine (2 - Moderna risk series) 03/26/2023 (Originally 03/29/2021)   Hepatitis C Screening  02/04/2024 (Originally 09/19/1993)   MAMMOGRAM  08/21/2023   Medicare Annual Wellness (AWV)  03/13/2024   Cervical Cancer Screening (HPV/Pap Cotest)  03/09/2028   DTaP/Tdap/Td (3 - Td or Tdap) 12/07/2031   INFLUENZA VACCINE  Completed   HIV Screening  Completed   HPV VACCINES  Aged Out    Health Maintenance  Health Maintenance Due  Topic Date Due   Colonoscopy  Never done    Colorectal cancer screening: Type of screening: Colonoscopy. Completed Not completed but will be ordered by her current GI. Repeat every 10 years  Mammogram status: Completed 08/21/22. Repeat every year    Lung Cancer Screening: (Low Dose CT Chest recommended if Age 63-80 years, 20 pack-year currently smoking OR have quit w/in 15years.) does not qualify.     Additional Screening:  Hepatitis C Screening: does qualify; declined completion  Vision Screening: Recommended annual ophthalmology exams for early detection of glaucoma and other disorders of the eye. Is the patient up to date with their annual eye exam?  Yes  Who is the provider or what is the name of the office in which the patient attends annual eye exams? [facility] for eye exams If pt is not established with a provider, would they like to be referred to a provider to establish care? No .   Dental Screening: Recommended annual dental exams for proper oral hygiene        Plan:     I have personally reviewed and noted the following in the patient's chart:   Medical and social history Use of alcohol, tobacco or illicit drugs  Current medications and supplements including opioid prescriptions. Patient is currently taking opioid prescriptions. Information provided to patient regarding non-opioid alternatives. Patient advised to discuss non-opioid treatment  plan with their provider. Functional ability and status Nutritional status Physical activity Advanced directives List of other physicians Hospitalizations, surgeries, and ER visits in previous 12 months Vitals Screenings to include cognitive, depression, and falls Referrals and appointments  In addition, I have reviewed and discussed with patient certain preventive protocols, quality metrics, and best practice recommendations. A written personalized care plan for preventive services as well as general preventive health recommendations were provided to patient.     Ziere Docken, Farley Ly, CMA   03/14/2023   After Visit Summary: (MyChart) Due to this being a telephonic visit, the after visit summary with patients personalized plan was offered to patient via MyChart

## 2023-03-14 NOTE — Patient Instructions (Signed)
Erica [name] , Thank you for taking time to come for your Medicare Wellness Visit. I appreciate your ongoing commitment to your health goals. Please review the following plan we discussed and let me know if I can assist you in the future.   Referrals/Orders/Follow-Ups/Clinician Recommendations: none placed  This is a list of the screening recommended for you and due dates:  Health Maintenance  Topic Date Due   Colon Cancer Screening  Never done   COVID-19 Vaccine (2 - Moderna risk series) 03/26/2023*   Hepatitis C Screening  02/04/2024*   Mammogram  08/21/2023   Medicare Annual Wellness Visit  03/13/2024   Pap with HPV screening  03/09/2028   DTaP/Tdap/Td vaccine (3 - Td or Tdap) 12/07/2031   Flu Shot  Completed   HIV Screening  Completed   HPV Vaccine  Aged Out  *Topic was postponed. The date shown is not the original due date.    Advanced directives: (In Chart) A copy of your advanced directives are scanned into your chart should your provider ever need it.  Next Medicare Annual Wellness Visit scheduled for next year: No

## 2023-03-18 ENCOUNTER — Other Ambulatory Visit (HOSPITAL_BASED_OUTPATIENT_CLINIC_OR_DEPARTMENT_OTHER): Payer: Self-pay | Admitting: Internal Medicine

## 2023-04-02 ENCOUNTER — Other Ambulatory Visit (HOSPITAL_BASED_OUTPATIENT_CLINIC_OR_DEPARTMENT_OTHER): Payer: Self-pay | Admitting: Cardiovascular Disease

## 2023-04-02 DIAGNOSIS — G8929 Other chronic pain: Secondary | ICD-10-CM

## 2023-04-02 DIAGNOSIS — I2510 Atherosclerotic heart disease of native coronary artery without angina pectoris: Secondary | ICD-10-CM

## 2023-04-02 DIAGNOSIS — E785 Hyperlipidemia, unspecified: Secondary | ICD-10-CM

## 2023-04-02 DIAGNOSIS — R072 Precordial pain: Secondary | ICD-10-CM

## 2023-04-02 DIAGNOSIS — I119 Hypertensive heart disease without heart failure: Secondary | ICD-10-CM

## 2023-04-04 ENCOUNTER — Inpatient Hospital Stay: Payer: Medicare Other | Attending: Adult Health

## 2023-04-04 ENCOUNTER — Other Ambulatory Visit: Payer: Self-pay | Admitting: Internal Medicine

## 2023-04-04 ENCOUNTER — Other Ambulatory Visit: Payer: Self-pay | Admitting: Cardiovascular Disease

## 2023-04-04 VITALS — BP 199/85 | HR 81 | Temp 98.2°F | Resp 16

## 2023-04-04 DIAGNOSIS — Z5111 Encounter for antineoplastic chemotherapy: Secondary | ICD-10-CM | POA: Diagnosis present

## 2023-04-04 DIAGNOSIS — C50412 Malignant neoplasm of upper-outer quadrant of left female breast: Secondary | ICD-10-CM | POA: Diagnosis present

## 2023-04-04 DIAGNOSIS — E785 Hyperlipidemia, unspecified: Secondary | ICD-10-CM

## 2023-04-04 DIAGNOSIS — I2510 Atherosclerotic heart disease of native coronary artery without angina pectoris: Secondary | ICD-10-CM

## 2023-04-04 DIAGNOSIS — Z171 Estrogen receptor negative status [ER-]: Secondary | ICD-10-CM | POA: Diagnosis not present

## 2023-04-04 MED ORDER — GOSERELIN ACETATE 3.6 MG ~~LOC~~ IMPL
3.6000 mg | DRUG_IMPLANT | SUBCUTANEOUS | Status: DC
  Administered 2023-04-04: 3.6 mg via SUBCUTANEOUS
  Filled 2023-04-04: qty 3.6

## 2023-04-04 NOTE — Progress Notes (Signed)
 Pt here for Zoladex  inj, BP elevated as usual per pt, she denies any symptoms. Erica Dalton Kendall, NP notified and stated for pt to follow up with cardiologist and bring BP readings to next appt. Pt has an appt with cardiologist this month and she is taking hypertensive meds and monitoring at home. Pt agrees to continue monitoring and managing BP at home.

## 2023-04-05 ENCOUNTER — Other Ambulatory Visit (HOSPITAL_BASED_OUTPATIENT_CLINIC_OR_DEPARTMENT_OTHER): Payer: Self-pay | Admitting: Family

## 2023-04-07 NOTE — Telephone Encounter (Signed)
 Pt was last seen 01-03-23. The lov stated he was no better on rx. Pt is requesting refill. Ok to grant refill?

## 2023-04-11 ENCOUNTER — Encounter: Payer: Self-pay | Admitting: Oncology

## 2023-04-14 ENCOUNTER — Encounter: Payer: Self-pay | Admitting: Oncology

## 2023-04-14 ENCOUNTER — Other Ambulatory Visit (HOSPITAL_BASED_OUTPATIENT_CLINIC_OR_DEPARTMENT_OTHER): Payer: Self-pay

## 2023-04-14 ENCOUNTER — Encounter (HOSPITAL_BASED_OUTPATIENT_CLINIC_OR_DEPARTMENT_OTHER): Payer: Self-pay | Admitting: Family

## 2023-04-14 ENCOUNTER — Ambulatory Visit (HOSPITAL_BASED_OUTPATIENT_CLINIC_OR_DEPARTMENT_OTHER): Payer: Medicare Other | Admitting: Family

## 2023-04-14 VITALS — BP 150/80 | HR 76 | Ht 65.0 in | Wt 222.8 lb

## 2023-04-14 DIAGNOSIS — G4733 Obstructive sleep apnea (adult) (pediatric): Secondary | ICD-10-CM | POA: Diagnosis not present

## 2023-04-14 DIAGNOSIS — I1A0 Resistant hypertension: Secondary | ICD-10-CM | POA: Diagnosis not present

## 2023-04-14 DIAGNOSIS — I471 Supraventricular tachycardia, unspecified: Secondary | ICD-10-CM

## 2023-04-14 DIAGNOSIS — I5032 Chronic diastolic (congestive) heart failure: Secondary | ICD-10-CM | POA: Diagnosis not present

## 2023-04-14 DIAGNOSIS — I701 Atherosclerosis of renal artery: Secondary | ICD-10-CM

## 2023-04-14 DIAGNOSIS — E785 Hyperlipidemia, unspecified: Secondary | ICD-10-CM

## 2023-04-14 MED ORDER — TORSEMIDE 20 MG PO TABS
20.0000 mg | ORAL_TABLET | Freq: Every day | ORAL | 1 refills | Status: DC
  Filled 2023-04-14: qty 270, 90d supply, fill #0

## 2023-04-14 NOTE — Progress Notes (Signed)
 Cardiology Office Note:  .   Date:  04/14/2023  ID:  Erica [name], DOB 08-21-1975, MRN [id number] PCP: Knute Thersia Bitters, FNP  [facility] Providers Cardiologist:  Annabella Scarce, MD    History of Present Illness: .   Erica [name] is a 48 y.o. female with a hx of resistant hypertension, chronic diastolic heart failure, breast cancer s/p chemo and XRT, morbid obesity, OSA on BIPAP, renal artery stenosis, CAD, HLD.   She has history of hypertension dating back to treatment of breast cancer in 2010. She has established with Dr. Scarce in the hypertension clinic. Previous echocardiogram 03/2018 LVEF 45-50%, grade 2 diastolic dysfunction, mild MR, PASP 33. Renal artery duppler 2019 with bilateral 1-59% stenosis. CT-A abdomen with 1.3 cm renal cyst but no adrenal adenomas. No evidence of renal artery stenosis.    She has a hx of recurrent syncope. 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 She has sleep apnea and uses BIPAP. She had LHC 05/2019 with finding of 60% distal LCx and 80% distal LAD disease. She was started on Clopidogrel  due to allergy  to aspirin . LE duplex 10/2019 for lower extremity edema was negative for DVT.    Blood pressure has been difficult to control. Doxazosin  ordered at initial clinic visit. Amlodipine  subsequently added. She has been given Metolazone  in addition to Torsemide  due to edema.  Her hydralazine  was reduced to 50mg  TID and doxazosin  increased to 8mg  twice daily to try to reduce TID dosing. Ranexa  increased due to chest pain with improvement. Clonidine  increased to 0.6mg  patch. Monitor 03/23/20  showing PVC with up to 8 beats of SVT.     R/LHC 10/25/20 with distal LAD 80% stenosed, LPAV lesion 30% stenosed with 50% stenosed side branch in first LPL. Hyperdynamic LV systolic function was noted. Hemodynamic findings were consistent with mild secondary pulmonary hypertension. Overall unchanged from previous. Angina  was suspected due to accelerated hypertension, increased wall stress, and microvascular ischemia. Distal LAD not favorable for PCI but could be considered for intractable pain. BP markedly elevated 200s/110s which improved after medication to 152/78.   Echo 04/05/21 normal LVEF 55-60%, mild LVH, normal diastolic parameters, mildly elevated PASP, bilateral atria moderately dilated, mild MR. Based on follow up labs her Metolazone  was reduced from 5mg  to 2.5mg  weekly.  Minoxidil  was increased to 5 mg twice daily.   At follow up 05/2021 BP 150s/70s at home. She was not taking Doxazosin  nor Minoxidil  and these were resumed. At follow up 07/16/21, HCTZ transitioned to Chlorthalidone . Doxasozin dose increased.  She wore a 24h BP monitor with average daytime BP 135/68 and during white coat period 182/96. Average overall  BP on monitor 127/65. Suspected inaccurate as lower than her home readings.    At visit 10/02/22 Diltiazem  increased to 300mg  every day due to palpitations. Updated renal duplex with minimal stenosis. She had renal denervation 11/20/2022. Seen 11/28/22 with BP at home 140-150s. Unfortunately, Georjean was denied. BMP/BNP/CBC ordered due to dyspnea were unremarkable.   At last visit 01/09/2023 due to palpitations diltiazem  increased to 360 mg daily.  14-day monitor with predominant normal sinus rhythm, 6 episodes of NSVT up to 16 beats NSVT up to 14 beats.  Presents today for follow-up intermittently.  Notes episodes of heart racing fast associated with chest pain.  Occurring 4 times per week most often at rest lasting up to 5 minutes.  No caffeine, alcohol.  Does note she has not been drinking  as much encouraged to increase hydration.  Did notice some improvement after increased dose of diltiazem .  SBP at home most often 145-150.  She notes onset of back pain after RDN with x-ray with sports medicine showing degenerative disc disease at L3-L4 and L5-S1 with mild L5-S1 facet hypertrophy.  We discussed that  her back pain is likely related this and not RDN.  She has upcoming follow-up with with Dr. Joane. She did a course of prednisone  in November which maybe made a difference.   ROS: Please see the history of present illness.    All other systems reviewed and are negative.   Studies Reviewed: .           Risk Assessment/Calculations:     HYPERTENSION CONTROL Vitals:   04/14/23 0807 04/14/23 0810  BP: (!) 154/80 (!) 150/80    The patient's blood pressure is elevated above target today.  In order to address the patient's elevated BP:           Physical Exam:   VS:  BP (!) 150/80 Comment: home BP  Pulse 76   Ht 5' 5 (1.651 m)   Wt 222 lb 12.8 oz (101.1 kg)   LMP  (LMP Unknown)   SpO2 95%   BMI 37.08 kg/m    Wt Readings from Last 3 Encounters:  04/14/23 222 lb 12.8 oz (101.1 kg)  03/14/23 218 lb 11.2 oz (99.2 kg)  03/10/23 218 lb 11.2 oz (99.2 kg)    Vitals:   04/14/23 0807 04/14/23 0810  BP: (!) 154/80 (!) 150/80 Comment: home BP  Pulse: 76   Height: 5' 5 (1.651 m)   Weight: 222 lb 12.8 oz (101.1 kg)   SpO2: 95%   BMI (Calculated): 37.08     GEN: Well nourished, well developed in no acute distress NECK: No JVD; No carotid bruits CARDIAC: RRR, no murmurs, rubs, gallops RESPIRATORY:  Clear to auscultation without rales, wheezing or rhonchi  ABDOMEN: Soft, non-tender, non-distended EXTREMITIES:  No edema; No deformity   ASSESSMENT AND PLAN: .    Malignant hypertension - s/p renal denervation 11/20/22. Current regimen Clonidine  0.6mg  patch, Amlodipine  10mg  QD, Doxazosin  16mg  QHS, Diltiazem  360mg  daily, Hydralazine  50mg  TID, Chlorthalidone  25mg  QD, Torsemide  20mg  BID, Nebivolol  40mg  QD, Ranexa  1000mg  BID, Spironolactone  100mg  every day. Increase torsemide  to 40 mg a.m. and 20 mg p.m.  Has repeat labs with oncology. We discussed increasing dose of hydralazine  but she reports this was previously ineffective.   Prior secondary workup has been unremarkable.  Sleep apnea  treated BP at home 140-150s.    PAD / Renal artery stenosis - Renal duplex 09/2018 bilateral 1-59% stenosis with CT angio abdomen 09/22/19 with no RAS. Renal duplex 01/2023 L renal artery 1-59% stenosed, no right RAS. Continue Repatha , Rosuvastatin .   Chronic stable angina / CAD - Intolerant of Imdur  due to headache. She is on maximum dose Ranexa . GDMT includes Plavix  (no Aspirin  due to allergy ), Nebivolol , Rosuvastatin , Zetia . R/LHC 09/2020 with stable single vessel CAD with apical 80% stenosis unchanged with torturous coronary arteries. Distal LAD lesion not favorable for PCI. No symptoms concerning for worsening angina. Given CAD would benefit from [facility] for reduction of cardiovascular risk, will submit prior auth to see if covered by her insurance.    Hypertriglyceridemia / HLD, LDL goal <70 - Continue Repatha , Rosuvastatin  40mg  QD. 09/2022 LDL As lipids controlled for simplification of her regimen will discontinue Zetia .   OSA - BIPAP compliance encouraged. Reports wearing every evening with  her oxygen. She follows with Dr. Shlomo.   Chronic diastolic heart failure -  Continue current dose of  metolazone , Entresto , nebivolol .  Stable renal function despite multiple diuretics.  Increase torsemide  to 40 mg in the morning and 20 mg in the evening for blood pressure control.   Asthma - Follows with pulmonology Dr. Darlean.  No signs of acute exacerbation   SVT -monitor 12/2022 predominately normal sinus rhythm with short episodes of SVT and NSVT.  Continue diltiazem  360 mg daily, nebivolol  40 mg daily.  Encouraged to avoid caffeine, manage stress well, stay hydrated.   BMI 35 - Weight loss via diet and exercise encouraged. Discussed the impact being overweight would have on cardiovascular risk.10/02/22 A1c 6.1.  Georjean previously denied by insurance.      Dispo: follow up in 3-4 months  Signed, Reche GORMAN Finder, NP

## 2023-04-14 NOTE — Progress Notes (Signed)
   LILLETTE Ileana Collet, PhD, LAT, ATC acting as a scribe for [name], MD.  Erica [name] is a 48 y.o. female who presents to [facility] today for continued trigger finger.  Patient was last seen for this on 09/09/2022 and was given a left fourth trigger finger injection.  Today, patient reports L 4th finger starting hurting and triggering about 2-wks ago. She feels like it is worse than the prior flare. She is LHD.   Dx imaging: 09/04/20 R 2nd finger & C-spine XR   Pertinent review of systems: No fevers or chills  Relevant historical information: Recalcitrant hypertension.   Exam:  BP (!) 148/92   Pulse 72   Ht 5' 5 (1.651 m)   Wt 220 lb (99.8 kg)   LMP  (LMP Unknown)   SpO2 95%   BMI 36.61 kg/m  General: Well Developed, well nourished, and in no acute distress.   MSK: Left hand normal-appearing Tender palpation palmar fourth MCP.  Triggering present with flexion of PIP joint.    Procedure: Real-time Ultrasound Guided Injection of left fourth A1 pulley tendon sheath (trigger finger injection) Device: Philips Affiniti 50G/GE Logiq Images permanently stored and available for review in PACS Verbal informed consent obtained.  Discussed risks and benefits of procedure. Warned about infection, bleeding, hyperglycemia damage to structures among others. Patient expresses understanding and agreement Time-out conducted.   Noted no overlying erythema, induration, or other signs of local infection.   Skin prepped in a sterile fashion.   Local anesthesia: Topical Ethyl chloride.   With sterile technique and under real time ultrasound guidance: 40 mg of Kenalog  and 1 mL of lidocaine  injected into tendon sheath at left fourth A1 pulley. Fluid seen entering the tendon sheath.   Completed without difficulty   Pain immediately resolved suggesting accurate placement of the medication.   Advised to call if fevers/chills, erythema, induration, drainage, or  persistent bleeding.   Images permanently stored and available for review in the ultrasound unit.  Impression: Technically successful ultrasound guided injection.        Assessment and Plan: 48 y.o. female with recurrent trigger finger left hand.  She had an injection for the same problem in June 2024 which worked until recently.  Plan on repeat injection today and double Band-Aid splint/custom brace made by hand therapy in the past.  If this becomes a recurrent problem consider hand surgery consultation.   PDMP not reviewed this encounter. Orders Placed This Encounter  Procedures   US  LIMITED JOINT SPACE STRUCTURES UP LEFT(NO LINKED CHARGES)    Reason for Exam (SYMPTOM  OR DIAGNOSIS REQUIRED):   left hand pain    Preferred imaging location?:   [facility]   No orders of the defined types were placed in this encounter.    Discussed warning signs or symptoms. Please see discharge instructions. Patient expresses understanding.   The above documentation has been reviewed and is accurate and complete [name], M.D.

## 2023-04-14 NOTE — Patient Instructions (Signed)
 Medication Instructions:  Stop Zetia . Torsemide  20 mg ( Take 40 mg In The Morning (2 Tablets) Take 1 20 mg  Tablet In the Evening ). *If you need a refill on your cardiac medications before your next appointment, please call your pharmacy*   Lab Work: No labs If you have labs (blood work) drawn today and your tests are completely normal, you will receive your results only by: MyChart Message (if you have MyChart) OR A paper copy in the mail If you have any lab test that is abnormal or we need to change your treatment, we will call you to review the results.   Testing/Procedures: No Testing   Follow-Up: At [facility], you and your health needs are our priority.  As part of our continuing mission to provide you with exceptional heart care, we have created designated Provider Care Teams.  These Care Teams include your primary Cardiologist (physician) and Advanced Practice Providers (APPs -  Physician Assistants and Nurse Practitioners) who all work together to provide you with the care you need, when you need it.  We recommend signing up for the patient portal called MyChart.  Sign up information is provided on this After Visit Summary.  MyChart is used to connect with patients for Virtual Visits (Telemedicine).  Patients are able to view lab/test results, encounter notes, upcoming appointments, etc.  Non-urgent messages can be sent to your provider as well.   To learn more about what you can do with MyChart, go to [email or web address].    Your next appointment:   3 month(s)  Provider:   Reche Finder, NP

## 2023-04-15 ENCOUNTER — Other Ambulatory Visit: Payer: Self-pay | Admitting: Adult Health

## 2023-04-15 ENCOUNTER — Ambulatory Visit (INDEPENDENT_AMBULATORY_CARE_PROVIDER_SITE_OTHER): Payer: 59 | Admitting: Family Medicine

## 2023-04-15 ENCOUNTER — Other Ambulatory Visit: Payer: Self-pay

## 2023-04-15 VITALS — BP 148/92 | HR 72 | Ht 65.0 in | Wt 220.0 lb

## 2023-04-15 DIAGNOSIS — C50412 Malignant neoplasm of upper-outer quadrant of left female breast: Secondary | ICD-10-CM

## 2023-04-15 DIAGNOSIS — M79645 Pain in left finger(s): Secondary | ICD-10-CM | POA: Diagnosis not present

## 2023-04-15 DIAGNOSIS — M65342 Trigger finger, left ring finger: Secondary | ICD-10-CM

## 2023-04-15 NOTE — Patient Instructions (Addendum)
 Thank you for coming in today.   You received an injection today. Seek immediate medical attention if the joint becomes red, extremely painful, or is oozing fluid.   Continue splint as needed.   We can ask a surgical opinion of hand surgeon if needed

## 2023-04-22 ENCOUNTER — Other Ambulatory Visit: Payer: Self-pay | Admitting: Internal Medicine

## 2023-04-29 ENCOUNTER — Encounter (HOSPITAL_BASED_OUTPATIENT_CLINIC_OR_DEPARTMENT_OTHER): Payer: 59

## 2023-05-01 ENCOUNTER — Encounter (HOSPITAL_BASED_OUTPATIENT_CLINIC_OR_DEPARTMENT_OTHER): Payer: Self-pay | Admitting: *Deleted

## 2023-05-05 ENCOUNTER — Other Ambulatory Visit: Payer: Self-pay

## 2023-05-05 DIAGNOSIS — Z171 Estrogen receptor negative status [ER-]: Secondary | ICD-10-CM

## 2023-05-06 ENCOUNTER — Inpatient Hospital Stay: Payer: 59 | Attending: Adult Health

## 2023-05-06 ENCOUNTER — Inpatient Hospital Stay: Payer: 59

## 2023-05-06 ENCOUNTER — Encounter: Payer: Self-pay | Admitting: Adult Health

## 2023-05-06 ENCOUNTER — Inpatient Hospital Stay (HOSPITAL_BASED_OUTPATIENT_CLINIC_OR_DEPARTMENT_OTHER): Payer: Self-pay | Admitting: Adult Health

## 2023-05-06 ENCOUNTER — Other Ambulatory Visit: Payer: 59

## 2023-05-06 ENCOUNTER — Ambulatory Visit: Payer: 59

## 2023-05-06 ENCOUNTER — Ambulatory Visit: Payer: 59 | Admitting: Adult Health

## 2023-05-06 VITALS — BP 184/84 | HR 73 | Temp 98.1°F | Resp 18 | Ht 65.0 in | Wt 220.9 lb

## 2023-05-06 DIAGNOSIS — M549 Dorsalgia, unspecified: Secondary | ICD-10-CM | POA: Insufficient documentation

## 2023-05-06 DIAGNOSIS — Z171 Estrogen receptor negative status [ER-]: Secondary | ICD-10-CM

## 2023-05-06 DIAGNOSIS — Z79818 Long term (current) use of other agents affecting estrogen receptors and estrogen levels: Secondary | ICD-10-CM | POA: Diagnosis not present

## 2023-05-06 DIAGNOSIS — G8929 Other chronic pain: Secondary | ICD-10-CM | POA: Insufficient documentation

## 2023-05-06 DIAGNOSIS — Z1732 Human epidermal growth factor receptor 2 negative status: Secondary | ICD-10-CM | POA: Diagnosis not present

## 2023-05-06 DIAGNOSIS — Z923 Personal history of irradiation: Secondary | ICD-10-CM | POA: Diagnosis not present

## 2023-05-06 DIAGNOSIS — C50412 Malignant neoplasm of upper-outer quadrant of left female breast: Secondary | ICD-10-CM | POA: Diagnosis present

## 2023-05-06 LAB — CMP (CANCER CENTER ONLY)
ALT: 31 U/L (ref 0–44)
AST: 21 U/L (ref 15–41)
Albumin: 4.3 g/dL (ref 3.5–5.0)
Alkaline Phosphatase: 68 U/L (ref 38–126)
Anion gap: 5 (ref 5–15)
BUN: 14 mg/dL (ref 6–20)
CO2: 30 mmol/L (ref 22–32)
Calcium: 9.6 mg/dL (ref 8.9–10.3)
Chloride: 105 mmol/L (ref 98–111)
Creatinine: 1.07 mg/dL — ABNORMAL HIGH (ref 0.44–1.00)
GFR, Estimated: >60 mL/min (ref >60)
Glucose, Bld: 101 mg/dL — ABNORMAL HIGH (ref 70–99)
Potassium: 4.1 mmol/L (ref 3.5–5.1)
Sodium: 140 mmol/L (ref 135–145)
Total Bilirubin: 0.6 mg/dL (ref 0.0–1.2)
Total Protein: 7.7 g/dL (ref 6.5–8.1)

## 2023-05-06 LAB — CBC WITH DIFFERENTIAL (CANCER CENTER ONLY)
Abs Immature Granulocytes: 0.01 10*3/uL (ref 0.00–0.07)
Basophils Absolute: 0 10*3/uL (ref 0.0–0.1)
Basophils Relative: 0 %
Eosinophils Absolute: 0.1 10*3/uL (ref 0.0–0.5)
Eosinophils Relative: 1 %
HCT: 44 % (ref 36.0–46.0)
Hemoglobin: 14.3 g/dL (ref 12.0–15.0)
Immature Granulocytes: 0 %
Lymphocytes Relative: 37 %
Lymphs Abs: 2.9 10*3/uL (ref 0.7–4.0)
MCH: 29.9 pg (ref 26.0–34.0)
MCHC: 32.5 g/dL (ref 30.0–36.0)
MCV: 91.9 fL (ref 80.0–100.0)
Monocytes Absolute: 0.4 10*3/uL (ref 0.1–1.0)
Monocytes Relative: 5 %
Neutro Abs: 4.3 10*3/uL (ref 1.7–7.7)
Neutrophils Relative %: 57 %
Platelet Count: 190 10*3/uL (ref 150–400)
RBC: 4.79 MIL/uL (ref 3.87–5.11)
RDW: 13.7 % (ref 11.5–15.5)
WBC Count: 7.7 10*3/uL (ref 4.0–10.5)
nRBC: 0 % (ref 0.0–0.2)

## 2023-05-06 MED ORDER — GOSERELIN ACETATE 3.6 MG ~~LOC~~ IMPL
3.6000 mg | DRUG_IMPLANT | SUBCUTANEOUS | Status: DC
Start: 2023-05-06 — End: 2023-05-06
  Administered 2023-05-06: 3.6 mg via SUBCUTANEOUS
  Filled 2023-05-06: qty 3.6

## 2023-05-06 NOTE — Progress Notes (Signed)
 [facility] Cancer Follow up:    Erica Thersia Bitters, FNP [address] [place] [address]   DIAGNOSIS:  Cancer Staging  Malignant neoplasm of upper-outer quadrant of left breast in female, estrogen receptor negative (HCC) Staging form: Breast, AJCC 7th Edition - Clinical: Stage IIB (T2, N1, M0) - Signed by Layla Sandria BROCKS, MD on 10/20/2014   SUMMARY OF ONCOLOGIC HISTORY: Oncology History  Malignant neoplasm of upper-outer quadrant of left breast in female, estrogen receptor negative (HCC)   Neo-Adjuvant Chemotherapy   Docetaxel and cyclophosphamide x 4   09/2008 Definitive Surgery   Left lumpectomy/SLNB: IDC, ER/PR/ HER2 neu negative, Ki67 88%   09/2008 Pathologic Stage   Stage IIB: ypT2 ypN1   10/2008 Surgery   Full axillary dissection and re-excision of margins    - 03/2009 Chemotherapy   Paclitaxel weekly x 11    - 05/2009 Radiation Therapy   Adjuvant RT to left breast     CURRENT THERAPY: Zoladex   INTERVAL HISTORY:  Discussed the use of AI scribe software for clinical note transcription with the patient, who gave verbal consent to proceed.  Erica [name] 48 y.o. female returns for follow-up prior to receiving her Zoladex .  She has had persistent chronic back pain since undergoing renal denervation for her resistant hypertension.  She told me her blood pressures are controlled at home 140s systolic.  She has been following with Evan Corey in sports medicine for her back pain.  She underwent mammogram for her history of breast cancer on November 06, 2022 that demonstrated no mammographic evidence of malignancy however there was an abscess within her right breast and she was referred to surgery.  She tells me that she has not had any other breast wound since that time.  She also notes that her blood sugar being at 101 is concerning as she has not had anything to eat or drink this morning.  He continues to see her primary care  [employer].  Please note that she received Zoladex  for significant uterine bleeding.     Patient Active Problem List   Diagnosis Date Noted   Moderate episode of recurrent major depressive disorder (HCC) 02/04/2023   GAD (generalized anxiety disorder) 02/04/2023   Chronic respiratory failure with hypoxia (HCC) 12/31/2021   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   Coronary artery disease    Chronic stable angina (HCC) 08/22/2020   SVT (supraventricular tachycardia) (HCC) 08/22/2020   Palpitations 02/28/2020   Right ankle pain 04/14/2019   OSA (obstructive sleep apnea) 10/01/2018   Tibialis posterior tendinitis, left 07/15/2018   Chronic pain in left foot 06/29/2018   Vitamin D  deficiency disease 03/31/2018   Persistent proteinuria 03/31/2018   Hyperlipidemia LDL goal <100 03/30/2018   Chemotherapy-induced peripheral neuropathy (HCC) 12/09/2016   Asthma in adult, mild persistent, uncomplicated 01/19/2016   Hypertensive heart disease 09/26/2015   Depression with somatization 06/15/2015   Hypertriglyceridemia without hypercholesterolemia 06/15/2015   Hypokalemia 06/15/2015   Insomnia secondary to anxiety 04/05/2015   Idiopathic intracranial hypertension 10/21/2014   Upper airway cough syndrome 10/09/2014   Malignant hypertension 10/09/2014   Morbid obesity due to excess calories (HCC) 10/09/2014   Apnea, sleep 05/04/2014   Acute on chronic diastolic CHF (congestive heart failure), NYHA class 2 (HCC) 05/03/2014   DOE (dyspnea on exertion) 07/14/2013   Acute on chronic diastolic heart failure (HCC) 06/23/2013   Malignant neoplasm of upper-outer quadrant of left breast in female, estrogen receptor negative (HCC) 01/07/2013  Gastroparesis 01/04/2013   Resistant hypertension 09/17/2011   Axillary hidradenitis suppurativa 08/01/2011   Preventative health care 04/13/2011   Hyperglycemia 04/13/2011   GERD (gastroesophageal reflux disease) 03/18/2011   Cough variant asthma 09/27/2010    is  allergic to aspirin , bactrim [sulfamethoxazole-trimethoprim], doxycycline , kiwi extract, metoprolol , penicillins, and strawberry extract.  MEDICAL HISTORY: Past Medical History:  Diagnosis Date   Allergy     Anxiety    Apnea, sleep 05/04/2014   AHI 31/hr now on CPAP at 12cm H2O   Arthritis    Asthma    Asthma without status asthmaticus 01/19/2016   Axillary hidradenitis suppurativa 08/01/2011   Blood transfusion without reported diagnosis    Breast cancer (HCC) 2010   a. locally advanced left breast carcinoma diagnosed in 2010 and treated with neoadjuvant chemotherapy with docetaxel and cyclophosphamide as well as paclitaxel. This was followed by radiation therapy which was completed in 2011.   Chemotherapy-induced peripheral neuropathy (HCC) 12/09/2016   CHF (congestive heart failure) (HCC)    Chronic diastolic CHF (congestive heart failure) (HCC) 06/23/2013   Chronic kidney disease    Chronic pain in left foot 06/29/2018   Chronic stable angina (HCC) 08/22/2020   Cough variant asthma 09/27/2010   Followed in Pulmonary clinic/ [facility]/ [facility]  Onset reported in childhood  - PFT's 01/31/2011 FEV1  1.76 (60% ) ratio 68 and no better p B2,  DLCO 70% -med review 09/25/2012 > [facility]  drug assistance  paperwork given    - HFA 90% 11/20/2012 .  -Med calendar 12/07/12 , 04/06/2013 , 04/18/2014,, 08/30/2014 , 11/08/2014  - PFT's 01/12/2013  1.73 (65%)  Ratio 73 and no better p B2, DLCO 86% - trial off    Depression with somatization 06/15/2015   Dyspnea on exertion    a. 01/2013 Lexi MV: EF 54%, no ischemia/infarct;  b. 05/2013 Echo: EF 60-65%, no rwma, Gr 2 DD;  b.    Essential hypertension 10/09/2014   Estimated Creatinine Clearance: 82.4 mL/min (by C-G formula based on SCr of 0.98 mg/dL).   Fatty liver    Gastroparesis    GERD (gastroesophageal reflux disease)    Hyperglycemia 04/13/2011   Hypertension    Hypertensive heart disease 09/26/2015   Hypertriglyceridemia without  hypercholesterolemia 06/15/2015   Framingham risk score is 1%    Idiopathic intracranial hypertension 10/21/2014   Impaired glucose tolerance 04/13/2011   Insomnia secondary to anxiety 04/05/2015   Lymphadenitis, chronic    restricted LEFT extremity   Malignant neoplasm of upper-outer quadrant of left breast in female, estrogen receptor negative (HCC) 01/07/2013   Stage III, Invasive Ductal, s/p partial Left Mastectomy/Lumpectomy (baseball sized) with Lymph Node dissection, had Chemo and RadRx     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   OSA (obstructive sleep apnea) 05/04/2014   AHI 31/hr now on CPAP at 12cm H2O   Oxygen deficiency    Uses 02 in CPAP   Persistent proteinuria 03/31/2018   Personal history of chemotherapy 2010   Personal history of radiation therapy 2010   Resistant hypertension 09/17/2011       Sleep apnea    SVT (supraventricular tachycardia) (HCC) 08/22/2020   Tibialis posterior tendinitis, left 07/15/2018   Injected in March 10, 2019   Ulcer    Upper airway cough syndrome 10/09/2014   Onset around 2012 with assoc pseudowheeze MRI 05/18/15 > Paranasal sinuses are clear - 11/17/2015 rec increase h1 to 2 hs to help with noct cough  - Allergy  profile 11/17/2015 >  Eos 0.1 /  IgE  80 POS RAST  to grass, ragweed, cedar tree  - d/c acei 03/06/2018  - flare end of Jan 2020 in setting of ? Uri/ poorly controlled GERD - flare mid Oct 2020 with uri / assoc secondary gerd   Vitamin D  deficiency disease 03/31/2018    SURGICAL HISTORY: Past Surgical History:  Procedure Laterality Date   BRAIN SURGERY     Shunt   BREAST LUMPECTOMY Left 09/2008   CARPAL TUNNEL RELEASE Left 2011   LYMPH NODE DISSECTION Left 2010   breast; 2 wks after breast lumpectomy   RENAL DENERVATION N/A 11/20/2022   Procedure: RENAL DENERVATION;  Surgeon: Darron Deatrice LABOR, MD;  Location: [facility] INVASIVE CV LAB;  Service: Cardiovascular;  Laterality: N/A;   RIGHT/LEFT HEART CATH AND CORONARY ANGIOGRAPHY N/A  05/04/2019   Procedure: RIGHT/LEFT HEART CATH AND CORONARY ANGIOGRAPHY;  Surgeon: Cherrie Toribio SAUNDERS, MD;  Location: [facility] INVASIVE CV LAB;  Service: Cardiovascular;  Laterality: N/A;  Renal injection preformed    RIGHT/LEFT HEART CATH AND CORONARY ANGIOGRAPHY N/A 10/25/2020   Procedure: RIGHT/LEFT HEART CATH AND CORONARY ANGIOGRAPHY;  Surgeon: Anner Alm ORN, MD;  Location: [facility] INVASIVE CV LAB;  Service: Cardiovascular;  Laterality: N/A;   TENDON RELEASE Right 2012   hand   TUBAL LIGATION  05/11/2011   Procedure: POST PARTUM TUBAL LIGATION;  Surgeon: Lynwood Solomons, MD;  Location: [facility] OR[facility];  Service: Gynecology;  Laterality: Bilateral;  Induced for HTN    SOCIAL HISTORY: Social History   Socioeconomic History   Marital status: Single    Spouse name: Not on file   Number of children: 3   Years of education: Not on file   Highest education level: Bachelor's degree (e.g., BA, AB, BS)  Occupational History    Employer: UNEMPLOYED  Tobacco Use   Smoking status: Never   Smokeless tobacco: Never  Vaping Use   Vaping status: Never Used  Substance and Sexual Activity   Alcohol use: Yes    Comment: occasionally/socially   Drug use: No   Sexual activity: Yes    Birth control/protection: Surgical, None  Other Topics Concern   Not on file  Social History Narrative   She lives with four children.   She is currently not working (disbaility pending).   Left handed   One story home   Drinks caffeine      Social Drivers of Health   Financial Resource Strain: High Risk (01/27/2023)   Overall Financial Resource Strain (CARDIA)    Difficulty of Paying Living Expenses: Very hard  Food Insecurity: Food Insecurity Present (01/27/2023)   Hunger Vital Sign    Worried About Running Out of Food in the Last Year: Often true    Ran Out of Food in the Last Year: Sometimes true  Transportation Needs: Unmet Transportation Needs (01/27/2023)   PRAPARE - Administrator, Civil Service (Medical):  Yes    Lack of Transportation (Non-Medical): Yes  Physical Activity: Inactive (02/03/2023)   Exercise Vital Sign    Days of Exercise per Week: 0 days    Minutes of Exercise per Session: 0 min  Stress: Stress Concern Present (01/27/2023)   [employer] of Occupational Health - Occupational Stress Questionnaire    Feeling of Stress : Very much  Social Connections: Moderately Isolated (01/27/2023)   Social Connection and Isolation Panel [NHANES]    Frequency of Communication with Friends and Family: More than three times a week    Frequency of Social Gatherings with Friends and  Family: Once a week    Attends Religious Services: More than 4 times per year    Active Member of Clubs or Organizations: No    Attends Banker Meetings: Not on file    Marital Status: Never married  Intimate Partner Violence: Not At Risk (02/03/2023)   Humiliation, Afraid, Rape, and Kick questionnaire    Fear of Current or Ex-Partner: No    Emotionally Abused: No    Physically Abused: No    Sexually Abused: No    FAMILY HISTORY: Family History  Problem Relation Age of Onset   Other Mother 69       breast calcifications treated with surgery, breast cancer pill, and radiation   Diabetes Mother    Fibroids Sister 42       s/p TAH-BSO   Diabetes Maternal Grandmother    Heart disease Maternal Grandmother    Hypertension Maternal Grandmother    CAD Maternal Grandmother    Breast cancer Other        maternal great aunt (MGM's sister)   Breast cancer Other        paternal great aunt dx middle ages; s/p mastectomy   Cancer Neg Hx    Alcohol abuse Neg Hx    Early death Neg Hx    Hyperlipidemia Neg Hx    Kidney disease Neg Hx    Stroke Neg Hx    Colon cancer Neg Hx    Esophageal cancer Neg Hx    Stomach cancer Neg Hx    Rectal cancer Neg Hx    Colon polyps Neg Hx     Review of Systems  Constitutional:  Negative for appetite change, chills, fatigue, fever and unexpected weight change.   HENT:   Negative for hearing loss, lump/mass and trouble swallowing.   Eyes:  Negative for eye problems and icterus.  Respiratory:  Negative for chest tightness, cough and shortness of breath.   Cardiovascular:  Negative for chest pain, leg swelling and palpitations.  Gastrointestinal:  Negative for abdominal distention, abdominal pain, constipation, diarrhea, nausea and vomiting.  Endocrine: Negative for hot flashes.  Genitourinary:  Negative for difficulty urinating.   Musculoskeletal:  Negative for arthralgias.  Skin:  Negative for itching and rash.  Neurological:  Negative for dizziness, extremity weakness, headaches and numbness.  Hematological:  Negative for adenopathy. Does not bruise/bleed easily.  Psychiatric/Behavioral:  Negative for depression. The patient is not nervous/anxious.       PHYSICAL EXAMINATION    Vitals:   05/06/23 0837  BP: (!) 184/84  Pulse: 73  Resp: 18  Temp: 98.1 F (36.7 C)  SpO2: 98%    Physical Exam Constitutional:      General: She is not in acute distress.    Appearance: Normal appearance. She is not toxic-appearing.  HENT:     Head: Normocephalic and atraumatic.     Mouth/Throat:     Mouth: Mucous membranes are moist.     Pharynx: Oropharynx is clear. No oropharyngeal exudate or posterior oropharyngeal erythema.  Eyes:     General: No scleral icterus. Cardiovascular:     Rate and Rhythm: Normal rate and regular rhythm.     Pulses: Normal pulses.     Heart sounds: Normal heart sounds.  Pulmonary:     Effort: Pulmonary effort is normal.     Breath sounds: Normal breath sounds.  Chest:     Comments: Left breast status postlumpectomy and radiation no sign of local recurrence right breast is benign Abdominal:  General: Abdomen is flat. Bowel sounds are normal. There is no distension.     Palpations: Abdomen is soft.     Tenderness: There is no abdominal tenderness.  Musculoskeletal:        General: No swelling.     Cervical  back: Neck supple.  Lymphadenopathy:     Cervical: No cervical adenopathy.  Skin:    General: Skin is warm and dry.     Findings: No rash.  Neurological:     General: No focal deficit present.     Mental Status: She is alert.  Psychiatric:        Mood and Affect: Mood normal.        Behavior: Behavior normal.     LABORATORY DATA:  CBC    Component Value Date/Time   WBC 7.7 05/06/2023 0823   WBC 6.6 12/25/2022 1115   RBC 4.79 05/06/2023 0823   HGB 14.3 05/06/2023 0823   HGB 13.5 11/28/2022 1355   HGB 13.8 03/17/2017 0835   HCT 44.0 05/06/2023 0823   HCT 40.8 11/28/2022 1355   HCT 41.8 03/17/2017 0835   PLT 190 05/06/2023 0823   PLT 213 11/28/2022 1355   MCV 91.9 05/06/2023 0823   MCV 90 11/28/2022 1355   MCV 88.0 03/17/2017 0835   MCH 29.9 05/06/2023 0823   MCHC 32.5 05/06/2023 0823   RDW 13.7 05/06/2023 0823   RDW 13.3 11/28/2022 1355   RDW 14.5 03/17/2017 0835   LYMPHSABS 2.9 05/06/2023 0823   LYMPHSABS 2.3 02/28/2020 0841   LYMPHSABS 1.6 03/17/2017 0835   MONOABS 0.4 05/06/2023 0823   MONOABS 0.3 03/17/2017 0835   EOSABS 0.1 05/06/2023 0823   EOSABS 0.2 02/28/2020 0841   BASOSABS 0.0 05/06/2023 0823   BASOSABS 0.0 02/28/2020 0841   BASOSABS 0.1 03/17/2017 0835    CMP     Component Value Date/Time   NA 140 05/06/2023 0823   NA 143 11/28/2022 1355   NA 142 03/17/2017 0835   K 4.1 05/06/2023 0823   K 3.6 03/17/2017 0835   CL 105 05/06/2023 0823   CL 103 09/10/2012 1618   CO2 30 05/06/2023 0823   CO2 27 03/17/2017 0835   GLUCOSE 101 (H) 05/06/2023 0823   GLUCOSE 114 03/17/2017 0835   GLUCOSE 86 09/10/2012 1618   BUN 14 05/06/2023 0823   BUN 8 11/28/2022 1355   BUN 10.1 03/17/2017 0835   CREATININE 1.07 (H) 05/06/2023 0823   CREATININE 0.9 03/17/2017 0835   CALCIUM  9.6 05/06/2023 0823   CALCIUM  9.5 03/17/2017 0835   PROT 7.7 05/06/2023 0823   PROT 7.3 10/02/2022 0949   PROT 7.7 03/17/2017 0835   ALBUMIN 4.3 05/06/2023 0823   ALBUMIN 4.0  10/02/2022 0949   ALBUMIN 3.7 03/17/2017 0835   AST 21 05/06/2023 0823   AST 17 03/17/2017 0835   ALT 31 05/06/2023 0823   ALT 24 03/17/2017 0835   ALKPHOS 68 05/06/2023 0823   ALKPHOS 77 03/17/2017 0835   BILITOT 0.6 05/06/2023 0823   BILITOT 0.50 03/17/2017 0835   GFRNONAA >60 05/06/2023 0823   GFRAA 72 04/10/2020 0942   GFRAA 59 (L) 12/09/2018 1222        ASSESSMENT and THERAPY PLAN:   Malignant neoplasm of upper-outer quadrant of left breast in female, estrogen receptor negative (HCC) Erica [name] is a 48 year old woman with history of left-sided stage IIb triple negative breast cancer diagnosed in 2010 status post neoadjuvant chemotherapy, adjuvant chemotherapy, and adjuvant radiation to the left breast.  History of left-sided 2B triple negative breast cancer: She has no clinical or radiographic signs of breast cancer recurrence.  She will continue with annual mammograms next due in May 2025. She continues on Zoladex  every 4 weeks with good tolerance Resistant HTN: s/p renal denervation, continue f/u with cardiology Previous breast abscess: resolved.  Arthritis: I discussed the fact that she could potentially stop the Zoladex  and consider evaluation by gynecology for an ablation or hysterectomy.  She declines stating Zoladex  works well for her.  She knows it may be increasing her arthritis and decreasing bone density in her back somewhat but wants to continue to proceed with it.  RTC every 4 weeks for injection and f/u in 24 weeks prior to injection.   All questions were answered. The patient knows to call the clinic with any problems, questions or concerns. We can certainly see the patient much sooner if necessary.  Total encounter time:30 minutes*in face-to-face visit time, chart review, lab review, care coordination, order entry, and documentation of the encounter time.    Erica Kendall, NP 05/06/23 9:05 AM Medical Oncology and Hematology [facility] [address] [place], [place] [address] Tel. [phone]    Fax. [phone]  *Total Encounter Time as defined by the Centers for Medicare and Medicaid Services includes, in addition to the face-to-face time of a patient visit (documented in the note above) non-face-to-face time: obtaining and reviewing outside history, ordering and reviewing medications, tests or procedures, care coordination (communications with other health care professionals or caregivers) and documentation in the medical record.

## 2023-05-06 NOTE — Assessment & Plan Note (Signed)
 Erica [name] is a 48 year old woman with history of left-sided stage IIb triple negative breast cancer diagnosed in 2010 status post neoadjuvant chemotherapy, adjuvant chemotherapy, and adjuvant radiation to the left breast.  History of left-sided 2B triple negative breast cancer: She has no clinical or radiographic signs of breast cancer recurrence.  She will continue with annual mammograms next due in May 2025. She continues on Zoladex  every 4 weeks with good tolerance Resistant HTN: s/p renal denervation, continue f/u with cardiology Previous breast abscess: resolved.  Arthritis: I discussed the fact that she could potentially stop the Zoladex  and consider evaluation by gynecology for an ablation or hysterectomy.  She declines stating Zoladex  works well for her.  She knows it may be increasing her arthritis and decreasing bone density in her back somewhat but wants to continue to proceed with it.  RTC every 4 weeks for injection and f/u in 24 weeks prior to injection.

## 2023-05-08 ENCOUNTER — Telehealth: Payer: Self-pay | Admitting: Adult Health

## 2023-05-08 NOTE — Telephone Encounter (Signed)
 Scheduled appointments per 2/4 los. Left VM with appointment details.

## 2023-05-11 ENCOUNTER — Encounter: Payer: Self-pay | Admitting: Oncology

## 2023-05-12 ENCOUNTER — Encounter (HOSPITAL_BASED_OUTPATIENT_CLINIC_OR_DEPARTMENT_OTHER): Payer: Self-pay

## 2023-05-14 ENCOUNTER — Emergency Department (HOSPITAL_BASED_OUTPATIENT_CLINIC_OR_DEPARTMENT_OTHER)
Admission: EM | Admit: 2023-05-14 | Discharge: 2023-05-14 | Disposition: A | Discharge status: Home / Self Care | Payer: 59 | Attending: Emergency Medicine | Admitting: Emergency Medicine

## 2023-05-14 ENCOUNTER — Other Ambulatory Visit: Payer: Self-pay

## 2023-05-14 ENCOUNTER — Encounter: Payer: Self-pay | Admitting: Oncology

## 2023-05-14 ENCOUNTER — Encounter (HOSPITAL_BASED_OUTPATIENT_CLINIC_OR_DEPARTMENT_OTHER): Payer: Self-pay | Admitting: Emergency Medicine

## 2023-05-14 ENCOUNTER — Other Ambulatory Visit (HOSPITAL_BASED_OUTPATIENT_CLINIC_OR_DEPARTMENT_OTHER): Payer: Self-pay

## 2023-05-14 ENCOUNTER — Emergency Department (HOSPITAL_BASED_OUTPATIENT_CLINIC_OR_DEPARTMENT_OTHER): Payer: 59 | Admitting: Radiology

## 2023-05-14 DIAGNOSIS — R0981 Nasal congestion: Secondary | ICD-10-CM

## 2023-05-14 DIAGNOSIS — Z7902 Long term (current) use of antithrombotics/antiplatelets: Secondary | ICD-10-CM | POA: Diagnosis not present

## 2023-05-14 DIAGNOSIS — J45901 Unspecified asthma with (acute) exacerbation: Secondary | ICD-10-CM | POA: Insufficient documentation

## 2023-05-14 DIAGNOSIS — I110 Hypertensive heart disease with heart failure: Secondary | ICD-10-CM | POA: Insufficient documentation

## 2023-05-14 DIAGNOSIS — J101 Influenza due to other identified influenza virus with other respiratory manifestations: Secondary | ICD-10-CM | POA: Insufficient documentation

## 2023-05-14 DIAGNOSIS — Z853 Personal history of malignant neoplasm of breast: Secondary | ICD-10-CM | POA: Insufficient documentation

## 2023-05-14 DIAGNOSIS — R051 Acute cough: Secondary | ICD-10-CM | POA: Diagnosis present

## 2023-05-14 DIAGNOSIS — I509 Heart failure, unspecified: Secondary | ICD-10-CM | POA: Diagnosis not present

## 2023-05-14 DIAGNOSIS — Z79899 Other long term (current) drug therapy: Secondary | ICD-10-CM | POA: Insufficient documentation

## 2023-05-14 LAB — RESP PANEL BY RT-PCR (RSV, FLU A&B, COVID)  RVPGX2
Influenza A by PCR: POSITIVE — AB
Influenza B by PCR: NEGATIVE
Resp Syncytial Virus by PCR: NEGATIVE
SARS Coronavirus 2 by RT PCR: NEGATIVE

## 2023-05-14 MED ORDER — ALBUTEROL SULFATE (2.5 MG/3ML) 0.083% IN NEBU
2.5000 mg | INHALATION_SOLUTION | Freq: Once | RESPIRATORY_TRACT | Status: AC
  Administered 2023-05-14: 2.5 mg via RESPIRATORY_TRACT
  Filled 2023-05-14: qty 3

## 2023-05-14 MED ORDER — PREDNISONE 20 MG PO TABS
40.0000 mg | ORAL_TABLET | Freq: Every day | ORAL | 0 refills | Status: AC
  Filled 2023-05-14: qty 12, 6d supply, fill #0

## 2023-05-14 MED ORDER — CETIRIZINE HCL 10 MG PO TABS
10.0000 mg | ORAL_TABLET | Freq: Every day | ORAL | 0 refills | Status: AC | PRN
  Filled 2023-05-14: qty 30, 30d supply, fill #0

## 2023-05-14 MED ORDER — TRIAMCINOLONE ACETONIDE 55 MCG/ACT NA AERO
2.0000 | INHALATION_SPRAY | Freq: Every day | NASAL | 0 refills | Status: DC
  Filled 2023-05-14: qty 16.9, 1d supply, fill #0

## 2023-05-14 MED ORDER — IBUPROFEN 400 MG PO TABS
600.0000 mg | ORAL_TABLET | Freq: Once | ORAL | Status: AC
  Administered 2023-05-14: 600 mg via ORAL
  Filled 2023-05-14: qty 1

## 2023-05-14 MED ORDER — OSELTAMIVIR PHOSPHATE 75 MG PO CAPS
75.0000 mg | ORAL_CAPSULE | Freq: Two times a day (BID) | ORAL | 0 refills | Status: DC
  Filled 2023-05-14: qty 10, 5d supply, fill #0

## 2023-05-14 MED ORDER — BENZONATATE 100 MG PO CAPS
100.0000 mg | ORAL_CAPSULE | Freq: Three times a day (TID) | ORAL | 0 refills | Status: DC | PRN
  Filled 2023-05-14: qty 21, 7d supply, fill #0

## 2023-05-14 NOTE — ED Notes (Signed)
Discharge paperwork given and verbally understood.

## 2023-05-14 NOTE — ED Provider Notes (Signed)
EMERGENCY DEPARTMENT AT [facility] Provider Note   CSN: [id number] Arrival date & time: 05/14/23  6295     History  Chief Complaint  Patient presents with   Cough    Erica [name] is a 48 y.o. female.   Cough   48 year old female presents emergency department with cough, wheezing, nasal congestion, fever.  Symptom onset yesterday.  No known sick contact.  States she has a child at home that has not been ill recently.  Took Tylenol without significant improvement of symptoms as well as Phenergan cough syrup and other herbal remedies.  Denies any chest pain, shortness of breath, abdominal pain, nausea, vomiting urinary symptoms, change in bowel habits.  Patient also feels like she is wheezing somewhat lower than normal.  States she is on Singulair as well as a couple inhalers that have been helping temporarily when she does use them but feeling like she is having an asthma exacerbation.  Past medical history significant for OSA, CHF, breast cancer, hypertension, SVT, chronic left foot pain, allergies, chronic stable angina, gastroparesis, lymphadenitis, GERD, IIH with VP shunt, chemotherapy-induced peripheral neuropathy  Home Medications Prior to Admission medications   Medication Sig Start Date End Date Taking? Authorizing Provider  benzonatate (TESSALON) 100 MG capsule Take 1 capsule (100 mg total) by mouth 3 (three) times daily as needed. 05/14/23  Yes Peter Garter, PA  cetirizine (ZYRTEC ALLERGY) 10 MG tablet Take 1 tablet (10 mg total) by mouth daily as needed for allergies. 05/14/23  Yes Sherian Maroon A, PA  ezetimibe (ZETIA) 10 MG tablet Take 10 mg by mouth daily. 05/06/23  Yes [provider]  oseltamivir (TAMIFLU) 75 MG capsule Take 1 capsule (75 mg total) by mouth every 12 (twelve) hours. 05/14/23  Yes Sherian Maroon A, PA  predniSONE (DELTASONE) 20 MG tablet Take 2 tablets (40 mg total) by mouth daily with breakfast for 6 days. 05/14/23 05/20/23  Yes Sherian Maroon A, PA  triamcinolone (NASACORT) 55 MCG/ACT AERO nasal inhaler Place 2 sprays into the nose daily. 05/14/23  Yes Sherian Maroon A, PA  acetaZOLAMIDE (DIAMOX) 250 MG tablet Take 4 tablets (1,000 mg total) by mouth 3 (three) times daily. 07/19/19   Patel, Roxana Hires K, DO  albuterol (PROVENTIL) (2.5 MG/3ML) 0.083% nebulizer solution USE 1 VIAL VIA NEBULIZER EVERY 4 HOURS AND AS NEEDED FOR WHEEZING OR SHORTNESS OF BREATH 05/07/22   Cobb, Ruby Cola, NP  albuterol (VENTOLIN HFA) 108 (90 Base) MCG/ACT inhaler TAKE 2 PUFFS BY MOUTH EVERY 6 HOURS AS NEEDED FOR WHEEZE OR SHORTNESS OF BREATH 08/27/22   Cobb, Ruby Cola, NP  AMBULATORY NON FORMULARY MEDICATION Carpal Tunnel Wrist brace. Rt  Carpal Tunnel Syndrome G56.01  Disp 1 08/03/20   Rodolph Bong, MD  AMBULATORY NON FORMULARY MEDICATION Lace up ankle brace (ASO type)  Ankle sprain S93.491A  Disp 1 08/03/20   Rodolph Bong, MD  amitriptyline (ELAVIL) 100 MG tablet Take 2 tablets (200 mg total) by mouth at bedtime. 03/10/23   Hilbert Bible, FNP  amLODipine (NORVASC) 10 MG tablet TAKE 1 TABLET BY MOUTH EVERY DAY 12/11/22   Chilton Si, MD  budesonide-formoterol [facility]) 160-4.5 MCG/ACT inhaler INHALE 2 PUFFS INTO LUNGS 2 TIMES DAILY AS DIRECTED 04/22/23   Nyoka Cowden, MD  chlorthalidone (HYGROTON) 25 MG tablet TAKE 1 TABLET (25 MG TOTAL) BY MOUTH DAILY. 06/27/22 06/22/23  Chilton Si, MD  Cholecalciferol (VITAMIN D) 50 MCG (2000 UT) tablet Take 2,000 Units by mouth daily.  [provider]  cloNIDine (CATAPRES - DOSED IN MG/24 HR) 0.3 mg/24hr patch PLACE 2 PATCHES ONTO SKIN ONCE WEEKLY 02/17/23   Alver Sorrow, NP  clopidogrel (PLAVIX) 75 MG tablet TAKE 1 TABLET BY MOUTH EVERY DAY 04/03/23   Chilton Si, MD  clotrimazole (MYCELEX) 10 MG troche TAKE 1 TABLET BY MOUTH 5 TIMES DAILY. Patient not taking: Reported on 04/14/2023 07/30/22   Nyoka Cowden, MD  dexlansoprazole (DEXILANT) 60 MG capsule Take 1  capsule (60 mg total) by mouth daily. 02/10/20   Pyrtle, Carie Caddy, MD  dextromethorphan-guaiFENesin (MUCINEX DM) 30-600 MG 12hr tablet Take 2 tablets by mouth 2 (two) times daily.    [provider]  diltiazem (CARDIZEM CD) 360 MG 24 hr capsule Take 1 capsule (360 mg total) by mouth daily. 01/09/23   Alver Sorrow, NP  doxazosin (CARDURA) 8 MG tablet TAKE 2 TABLETS BY MOUTH AT BEDTIME 04/03/23   Chilton Si, MD  DULoxetine (CYMBALTA) 30 MG capsule TAKE 1 CAPSULE (30 MG TOTAL) BY MOUTH DAILY. WITH 60MG  FOR TOTAL OF 90 MG Patient taking differently: Take 30 mg by mouth See admin instructions. With 60mg  for total of 90 mg 04/05/20   Nita Sickle K, DO  DULoxetine (CYMBALTA) 60 MG capsule TAKE 1 CAPSULE (60 MG TOTAL) BY MOUTH DAILY. FOR TOTAL OF 90 MG Patient taking differently: Take 60 mg by mouth See admin instructions. Take with 30 mg for a total of 90 mg in the morning 04/05/20   Nita Sickle K, DO  ENTRESTO 97-103 MG TAKE 1 TABLET BY MOUTH TWICE A DAY 06/10/22   Chilton Si, MD  EPINEPHRINE 0.3 mg/0.3 mL IJ SOAJ injection INJECT 0.3 MLS INTO THE MUSCLE ONCE AS NEEDED FOR ANAPHYLAXIS Patient not taking: Reported on 04/14/2023 12/10/16   [name], MD  Evolocumab (REPATHA SURECLICK) 140 MG/ML SOAJ Inject 140 mg into the skin every 14 (fourteen) days. 11/11/22   Alver Sorrow, NP  famotidine (PEPCID) 20 MG tablet Take 1-2 tablets (20-40 mg total) by mouth at bedtime. Patient taking differently: Take 20 mg by mouth 4 (four) times daily -  before meals and at bedtime. 02/10/20   Pyrtle, Carie Caddy, MD  gabapentin (NEURONTIN) 300 MG capsule Take 1 capsule (300 mg total) by mouth 3 (three) times daily. One three times daily Patient taking differently: Take 300 mg by mouth 3 (three) times daily. 11/23/19   Rodolph Bong, MD  gabapentin (NEURONTIN) 300 MG capsule Take 1 capsule (300 mg total) by mouth 3 (three) times daily as needed. Patient not taking: Reported on 04/14/2023 02/11/23    Rodolph Bong, MD  hydrALAZINE (APRESOLINE) 50 MG tablet TAKE 1 TABLET BY MOUTH THREE TIMES A DAY 11/01/22   Chilton Si, MD  lidocaine-prilocaine (EMLA) cream Apply 1 application topically every 28 (twenty-eight) days. Apply 1 application to skin ever 28 days 02/11/17   [provider]  metoCLOPramide (REGLAN) 10 MG tablet TAKE 1 TABLET BY MOUTH 4 TIMES DAILY. 08/31/20   Pyrtle, Carie Caddy, MD  metolazone (ZAROXOLYN) 2.5 MG tablet TAKE 1 TABLET BY MOUTH ONE TIME PER WEEK 04/07/23   Alver Sorrow, NP  minoxidil (LONITEN) 2.5 MG tablet TAKE 2 TABLETS (5 MG TOTAL) BY MOUTH 2 (TWO) TIMES DAILY 09/05/22   Chilton Si, MD  montelukast (SINGULAIR) 10 MG tablet TAKE 1 TABLET BY MOUTH EVERYDAY AT BEDTIME 04/07/23   Nyoka Cowden, MD  Nebivolol HCl 20 MG TABS TAKE 2 TABLETS BY MOUTH EVERY  DAY 12/26/22   Alver Sorrow, NP  nitroGLYCERIN (NITROSTAT) 0.4 MG SL tablet PLACE 1 TABLET UNDER THE TONGUE EVERY 5 MINUTES AS NEEDED FOR CHEST PAIN. Patient not taking: Reported on 04/14/2023 05/07/22   Chilton Si, MD  oxymetazoline [facility] SINUS-MAX FULL FORCE) 0.05 % nasal spray Place 1 spray into both nostrils 2 (two) times daily.    [provider]  Potassium Chloride ER 20 MEQ TBCR Take 2 tablets (40 mEq total) by mouth 2 (two) times daily. 01/30/23   Chilton Si, MD  primidone (MYSOLINE) 50 MG tablet Take 0.5 tablets (25 mg total) by mouth at bedtime. 07/19/19   Patel, Donika K, DO  promethazine (PHENERGAN) 25 MG tablet TAKE 1 TABLET (25 MG TOTAL) BY MOUTH 3 (THREE) TIMES DAILY AS NEEDED FOR NAUSEA OR VOMITING. 09/11/20   Pyrtle, Carie Caddy, MD  promethazine-dextromethorphan (PROMETHAZINE-DM) 6.25-15 MG/5ML syrup Take 5 mLs by mouth 4 (four) times daily as needed for cough. 01/03/23   Nyoka Cowden, MD  ranolazine (RANEXA) 1000 MG SR tablet Take 1 tablet (1,000 mg total) by mouth 2 (two) times daily. 10/22/22   Chilton Si, MD  Respiratory Therapy Supplies (FLUTTER) DEVI Use as  directed 03/24/15   Nyoka Cowden, MD  rosuvastatin (CRESTOR) 40 MG tablet TAKE 1 TABLET BY MOUTH EVERY DAY 04/04/23   Chilton Si, MD  Spacer/Aero-Holding Chambers (AEROCHAMBER MV) inhaler Use as instructed 03/06/18   Nyoka Cowden, MD  spironolactone (ALDACTONE) 100 MG tablet TAKE 1 TABLET BY MOUTH EVERY DAY 04/03/23   Chilton Si, MD  torsemide (DEMADEX) 20 MG tablet Take 2 Tablets by mouth every Morning and 1 Tablet by mouth every Evening 04/14/23   Alver Sorrow, NP      Allergies    Aspirin, Bactrim [sulfamethoxazole-trimethoprim], Doxycycline, Kiwi extract, Metoprolol, Penicillins, and Strawberry extract    Review of Systems   Review of Systems  Respiratory:  Positive for cough.   All other systems reviewed and are negative.   Physical Exam Updated Vital Signs BP (!) 149/79   Pulse 87   Temp 98.8 F (37.1 C) (Oral)   Resp 13   Wt 98.4 kg   LMP  (LMP Unknown)   SpO2 100%   BMI 36.11 kg/m  Physical Exam Vitals and nursing note reviewed.  Constitutional:      General: She is not in acute distress.    Appearance: She is well-developed.  HENT:     Head: Normocephalic and atraumatic.     Nose: Congestion and rhinorrhea present.  Eyes:     Conjunctiva/sclera: Conjunctivae normal.  Cardiovascular:     Rate and Rhythm: Normal rate and regular rhythm.  Pulmonary:     Effort: Pulmonary effort is normal. No respiratory distress.     Breath sounds: Wheezing present. No rhonchi or rales.  Abdominal:     Palpations: Abdomen is soft.     Tenderness: There is no abdominal tenderness.  Musculoskeletal:        General: No swelling.     Cervical back: Neck supple.     Right lower leg: No edema.     Left lower leg: No edema.  Skin:    General: Skin is warm and dry.     Capillary Refill: Capillary refill takes less than 2 seconds.  Neurological:     Mental Status: She is alert.  Psychiatric:        Mood and Affect: Mood normal.     ED Results / Procedures /  Treatments   Labs (all labs ordered are listed, but only abnormal results are displayed) Labs Reviewed  RESP PANEL BY RT-PCR (RSV, FLU A&B, COVID)  [id number] - Abnormal; Notable for the following components:      Result Value   Influenza A by PCR POSITIVE (*)    All other components within normal limits    EKG None  Radiology DG Chest 2 View Result Date: 05/14/2023 CLINICAL DATA:  Cough.  Shortness of breath. EXAM: CHEST - 2 VIEW COMPARISON:  12/25/2022. FINDINGS: Bilateral lung fields are clear. Bilateral costophrenic angles are clear. Normal cardio-mediastinal silhouette. No acute osseous abnormalities. The soft tissues are within normal limits. Redemonstration of presumed VP shunt catheter extending from the lower neck up to the upper abdomen. IMPRESSION: No active cardiopulmonary disease. Electronically Signed   By: Jules Schick M.D.   On: 05/14/2023 09:20    Procedures Procedures    Medications Ordered in ED Medications  albuterol (PROVENTIL) (2.5 MG/3ML) 0.083% nebulizer solution 2.5 mg (2.5 mg Nebulization Given 05/14/23 0835)  ibuprofen (ADVIL) tablet 600 mg (600 mg Oral Given 05/14/23 0924)  albuterol (PROVENTIL) (2.5 MG/3ML) 0.083% nebulizer solution 2.5 mg (2.5 mg Nebulization Given 05/14/23 0929)  albuterol (PROVENTIL) (2.5 MG/3ML) 0.083% nebulizer solution 2.5 mg (2.5 mg Nebulization Given 05/14/23 1610)    ED Course/ Medical Decision Making/ A&P                                 Medical Decision Making Amount and/or Complexity of Data Reviewed Radiology: ordered.  Risk Prescription drug management.   This patient presents to the ED for concern of cough, chest, body ache, fever, this involves an extensive number of treatment options, and is a complaint that carries with it a high risk of complications and morbidity.  The differential diagnosis includes, COVID, flu, RSV, pneumonia, meningitis, other   Co morbidities that complicate the patient evaluation  See  HPI   Additional history obtained:  Additional history obtained from EMR External records from outside source obtained and reviewed including hospital records   Lab Tests:  I Ordered, and personally interpreted labs.  The pertinent results include: Viral and a positive for influenza A   Imaging Studies ordered:  I ordered imaging studies including chest x-ray I independently visualized and interpreted imaging which showed no acute cardiopulmonary abnormalities. I agree with the radiologist interpretation   Cardiac Monitoring: / EKG:  The patient was maintained on a cardiac monitor.  I personally viewed and interpreted the cardiac monitored which showed an underlying rhythm of: Sinus rhythm   Consultations Obtained:  N/a   Problem List / ED Course / Critical interventions / Medication management  Influenza A, cough, congestion, fever I ordered medication including albuterol, Advil   Reevaluation of the patient after these medicines showed that the patient improved I have reviewed the patients home medicines and have made adjustments as needed   Social Determinants of Health:  Denies tobacco, licit drug use   Test / Admission - Considered:  Influenza A, cough, congestion, fever Vitals signs significant for initial tachycardia heart rate of 109, hypertension, temperature of 100 F orally which improved with medications and time elapsed on emergency department to normal rate of 87, temp of 98.8, improvement of hypertension 149/79.. Otherwise within normal range and stable throughout visit. Laboratory/imaging studies significant for: See above 48 year old female presents emergency with complaints of less than 24 hours of cough, congestion, fever,  wheezing on exam, mild wheezing appreciated bilateral lung fields but this was after respiratory therapy had given albuterol nebulized therapy which patient subjectively noted improvement of symptoms.  Also appreciable nasal  congestion/rhinorrhea.  No evidence of hypoxia, increased work of breathing.  Workup today overall reassuring.  Chest x-ray without signs of pneumonia, pulmonary vas congestion/pleural effusion, pneumothorax or other acute cardiopulmonary abnormality.  Viral testing positive for influenza A.  Suspect the patient's symptoms likely secondary to influenza A.  Will treat accordingly.  Patient interested in beginning Tamiflu given less than 24 hours since his symptoms began.  Will additionally recommend symptomatic therapy as described in AVS.  Patient has been trying over-the-counter cough and cold medicines but with history of resistant hypertension on numerous antihypertensive medications.  Will recommend avoidance of OTC medications that are known to worsen blood pressure and recommend Coricidin instead if decongestion is desired.  Will recommend follow-up with PCP in the outpatient setting for reassessment.  Treatment plan discussed at length with patient and she acknowledged understanding was agreeable to said plan.  Patient overall well-appearing, afebrile in no acute distress. Worrisome signs and symptoms were discussed with the patient, and the patient acknowledged understanding to return to the ED if noticed. Patient was stable upon discharge.          Final Clinical Impression(s) / ED Diagnoses Final diagnoses:  Influenza A  Acute cough  Nasal congestion  Exacerbation of asthma, unspecified asthma severity, unspecified whether persistent    Rx / DC Orders ED Discharge Orders     None         Peter Garter, [place] 05/14/23 1021    Tanda Rockers A, DO 05/15/23 [phone]

## 2023-05-14 NOTE — ED Triage Notes (Signed)
Pt c/o cough and shob starting yesterday. Fever today. Pt assess in triage by RT

## 2023-05-14 NOTE — Discharge Instructions (Addendum)
As discussed, chest x-ray.  Normal.  No evidence of pneumonia, collapsed lung, fluid on the lung or other abnormality.  Your viral testing was positive for influenza A which is most likely causing symptoms.  Will recommend treatment of bodyaches, fever at home with Tylenol.  Will give you a cough suppressant to use as needed.  Given your amount of wheezing on exam, will recommend continued adherence with your at home breathing treatments as well as will send in short course of steroids.  Will also recommend nasal steroid spray in the form Nasacort/Flonase as well as additionally adding allergy medicine such as Claritin/Zyrtec/Allegra. Recommend otc allergy eye drops in the form of pataday.  Will recommend use of Coricidin for decongestion and avoidance of other over-the-counter cough and cold medicines as is can cause elevations of your blood pressure.  Recommend follow-up with your primary care for reassessment.  Please do not hesitate to return if the worrisome signs and symptoms we discussed become apparent.

## 2023-05-16 ENCOUNTER — Encounter: Payer: Self-pay | Admitting: Family Medicine

## 2023-05-26 ENCOUNTER — Other Ambulatory Visit (HOSPITAL_BASED_OUTPATIENT_CLINIC_OR_DEPARTMENT_OTHER): Payer: Self-pay

## 2023-05-31 ENCOUNTER — Encounter: Payer: Self-pay | Admitting: Oncology

## 2023-06-03 ENCOUNTER — Inpatient Hospital Stay: Payer: 59 | Attending: Adult Health

## 2023-06-03 VITALS — BP 148/82 | HR 88 | Temp 98.9°F | Resp 16

## 2023-06-03 DIAGNOSIS — C50412 Malignant neoplasm of upper-outer quadrant of left female breast: Secondary | ICD-10-CM | POA: Insufficient documentation

## 2023-06-03 DIAGNOSIS — Z1732 Human epidermal growth factor receptor 2 negative status: Secondary | ICD-10-CM | POA: Insufficient documentation

## 2023-06-03 DIAGNOSIS — Z5111 Encounter for antineoplastic chemotherapy: Secondary | ICD-10-CM | POA: Diagnosis present

## 2023-06-03 DIAGNOSIS — Z170 Estrogen receptor positive status [ER+]: Secondary | ICD-10-CM | POA: Insufficient documentation

## 2023-06-03 DIAGNOSIS — Z1722 Progesterone receptor negative status: Secondary | ICD-10-CM | POA: Insufficient documentation

## 2023-06-03 MED ORDER — GOSERELIN ACETATE 3.6 MG ~~LOC~~ IMPL
3.6000 mg | DRUG_IMPLANT | SUBCUTANEOUS | Status: DC
  Administered 2023-06-03: 3.6 mg via SUBCUTANEOUS
  Filled 2023-06-03: qty 3.6

## 2023-06-09 ENCOUNTER — Ambulatory Visit (HOSPITAL_BASED_OUTPATIENT_CLINIC_OR_DEPARTMENT_OTHER): Payer: 59 | Admitting: Family Medicine

## 2023-06-13 ENCOUNTER — Encounter (HOSPITAL_BASED_OUTPATIENT_CLINIC_OR_DEPARTMENT_OTHER): Payer: Self-pay | Admitting: Family Medicine

## 2023-06-13 ENCOUNTER — Ambulatory Visit (INDEPENDENT_AMBULATORY_CARE_PROVIDER_SITE_OTHER): Payer: 59 | Admitting: Family Medicine

## 2023-06-13 VITALS — BP 165/89 | HR 87 | Ht 65.0 in | Wt 217.6 lb

## 2023-06-13 DIAGNOSIS — F331 Major depressive disorder, recurrent, moderate: Secondary | ICD-10-CM | POA: Diagnosis not present

## 2023-06-13 DIAGNOSIS — R7303 Prediabetes: Secondary | ICD-10-CM | POA: Diagnosis not present

## 2023-06-13 DIAGNOSIS — F411 Generalized anxiety disorder: Secondary | ICD-10-CM

## 2023-06-13 LAB — POCT GLYCOSYLATED HEMOGLOBIN (HGB A1C)
HbA1c POC (<> result, manual entry): 6 % (ref 4.0–5.6)
HbA1c, POC (prediabetic range): 6 % (ref 5.7–6.4)
Hemoglobin A1C: 6 % — AB (ref 4.0–5.6)

## 2023-06-13 NOTE — Progress Notes (Signed)
 Subjective:   Erica [name] 1975/05/02 06/13/2023  Chief Complaint  Patient presents with   Medical Management of Chronic Issues    42-month follow up; denies any main concerns for today's visit.    HPI: Erica [name] presents today for re-assessment and management of chronic medical conditions.   [name] is here for medical management of impaired fasting glucose.  Patient's current IFG medication regimen is: Diet, Exercise  Adhering to a diabetic diet: yes Exercising Regularly:  Checking Blood Sugars: No  Denies polydipsia, polyphagia, polyuria.  Lab Results  Component Value Date   HGBA1C 6.0 (A) 06/13/2023   HGBA1C 6.0 06/13/2023   HGBA1C 6.0 06/13/2023   Wt Readings from Last 3 Encounters:  06/13/23 217 lb 9.6 oz (98.7 kg)  05/14/23 217 lb (98.4 kg)  05/06/23 220 lb 14.4 oz (100.2 kg)    ANXIETY AND DEPRESSION : [name] presents for the medical management of anxiety. Patient's elavil was recenlty increased due to reported irritability and anxiety, depression at last visit. Reports the increase in Elavil has improved irritability. She reports chronic depression due to ongoing medical comorbidities. Denies desire to change medication or counseling at this time.  Current medication regimen: Elavil 200mg  ; Cymbalta 90mg  todal  Counseling: Recommended Well controlled:  Denies SI/HI.     06/13/2023    8:13 AM 03/10/2023    9:23 AM 02/03/2023    9:39 AM 04/08/2017   12:52 PM  GAD 7 : Generalized Anxiety Score  Nervous, Anxious, on Edge 1 1 1 1   Control/stop worrying 3 2 3 1   Worry too much - different things 3 2 3 1   Trouble relaxing 2 2 2 2   Restless 3 3 3 1   Easily annoyed or irritable 3 2 2 2   Afraid - awful might happen 0 1 1 0  Total GAD 7 Score 15 13 15 8   Anxiety Difficulty Somewhat difficult Somewhat difficult Somewhat difficult       06/13/2023    8:12 AM 03/14/2023   10:24 AM 03/10/2023    9:23 AM  PHQ9 SCORE  ONLY  PHQ-9 Total Score 19 13 13      The following portions of the patient's history were reviewed and updated as appropriate: past medical history, past surgical history, family history, social history, allergies, medications, and problem list.   Patient Active Problem List   Diagnosis Date Noted   Prediabetes 06/13/2023   Moderate episode of recurrent major depressive disorder (HCC) 02/04/2023   GAD (generalized anxiety disorder) 02/04/2023   Chronic respiratory failure with hypoxia (HCC) 12/31/2021   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   Coronary artery disease    Chronic stable angina (HCC) 08/22/2020   SVT (supraventricular tachycardia) (HCC) 08/22/2020   Palpitations 02/28/2020   Right ankle pain 04/14/2019   OSA (obstructive sleep apnea) 10/01/2018   Tibialis posterior tendinitis, left 07/15/2018   Chronic pain in left foot 06/29/2018   Vitamin D deficiency disease 03/31/2018   Persistent proteinuria 03/31/2018   Hyperlipidemia LDL goal <100 03/30/2018   Chemotherapy-induced peripheral neuropathy (HCC) 12/09/2016   Asthma in adult, mild persistent, uncomplicated 01/19/2016   Hypertensive heart disease 09/26/2015   Depression with somatization 06/15/2015   Hypertriglyceridemia without hypercholesterolemia 06/15/2015   Hypokalemia 06/15/2015   Insomnia secondary to anxiety 04/05/2015   Idiopathic intracranial hypertension 10/21/2014   Upper airway cough syndrome 10/09/2014   Malignant hypertension 10/09/2014   Morbid obesity due to excess calories (HCC) 10/09/2014  Apnea, sleep 05/04/2014   Acute on chronic diastolic CHF (congestive heart failure), NYHA class 2 (HCC) 05/03/2014   DOE (dyspnea on exertion) 07/14/2013   Acute on chronic diastolic heart failure (HCC) 06/23/2013   Malignant neoplasm of upper-outer quadrant of left breast in female, estrogen receptor negative (HCC) 01/07/2013   Gastroparesis 01/04/2013   Resistant hypertension 09/17/2011   Axillary hidradenitis  suppurativa 08/01/2011   Preventative health care 04/13/2011   Hyperglycemia 04/13/2011   GERD (gastroesophageal reflux disease) 03/18/2011   Cough variant asthma 09/27/2010   Past Medical History:  Diagnosis Date   Allergy    Anxiety    Apnea, sleep 05/04/2014   AHI 31/hr now on CPAP at 12cm H2O   Arthritis    Asthma    Asthma without status asthmaticus 01/19/2016   Axillary hidradenitis suppurativa 08/01/2011   Blood transfusion without reported diagnosis    Breast cancer (HCC) 2010   a. locally advanced left breast carcinoma diagnosed in 2010 and treated with neoadjuvant chemotherapy with docetaxel and cyclophosphamide as well as paclitaxel. This was followed by radiation therapy which was completed in 2011.   Chemotherapy-induced peripheral neuropathy (HCC) 12/09/2016   CHF (congestive heart failure) (HCC)    Chronic diastolic CHF (congestive heart failure) (HCC) 06/23/2013   Chronic kidney disease    Chronic pain in left foot 06/29/2018   Chronic stable angina (HCC) 08/22/2020   Cough variant asthma 09/27/2010   Followed in Pulmonary clinic/ [facility]/ [facility]  Onset reported in childhood  - PFT's 01/31/2011 FEV1  1.76 (60% ) ratio 68 and no better p B2,  DLCO 70% -med review 09/25/2012 > [place] drug assistance  paperwork given    - HFA 90% 11/20/2012 .  -Med calendar 12/07/12 , 04/06/2013 , 04/18/2014,, 08/30/2014 , 11/08/2014  - PFT's 01/12/2013  1.73 (65%)  Ratio 73 and no better p B2, DLCO 86% - trial off    Depression with somatization 06/15/2015   Dyspnea on exertion    a. 01/2013 Lexi MV: EF 54%, no ischemia/infarct;  b. 05/2013 Echo: EF 60-65%, no rwma, Gr 2 DD;  b.    Essential hypertension 10/09/2014   Estimated Creatinine Clearance: 82.4 mL/min (by C-G formula based on SCr of 0.98 mg/dL).   Fatty liver    Gastroparesis    GERD (gastroesophageal reflux disease)    Hyperglycemia 04/13/2011   Hypertension    Hypertensive heart disease 09/26/2015   Hypertriglyceridemia  without hypercholesterolemia 06/15/2015   Framingham risk score is 1%    Idiopathic intracranial hypertension 10/21/2014   Impaired glucose tolerance 04/13/2011   Insomnia secondary to anxiety 04/05/2015   Lymphadenitis, chronic    restricted LEFT extremity   Malignant neoplasm of upper-outer quadrant of left breast in female, estrogen receptor negative (HCC) 01/07/2013   Stage III, Invasive Ductal, s/p partial Left Mastectomy/Lumpectomy (baseball sized) with Lymph Node dissection, had Chemo and RadRx     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   OSA (obstructive sleep apnea) 05/04/2014   AHI 31/hr now on CPAP at 12cm H2O   Oxygen deficiency    Uses 02 in CPAP   Persistent proteinuria 03/31/2018   Personal history of chemotherapy 2010   Personal history of radiation therapy 2010   Resistant hypertension 09/17/2011       Sleep apnea    SVT (supraventricular tachycardia) (HCC) 08/22/2020   Tibialis posterior tendinitis, left 07/15/2018   Injected in March 10, 2019   Ulcer    Upper airway cough syndrome 10/09/2014  Onset around 2012 with assoc pseudowheeze MRI 05/18/15 > Paranasal sinuses are clear - 11/17/2015 rec increase h1 to 2 hs to help with noct cough  - Allergy profile 11/17/2015 >  Eos 0.1 /  IgE  80 POS RAST  to grass, ragweed, cedar tree  - d/c acei 03/06/2018  - flare end of Jan 2020 in setting of ? Uri/ poorly controlled GERD - flare mid Oct 2020 with uri / assoc secondary gerd   Vitamin D deficiency disease 03/31/2018   Past Surgical History:  Procedure Laterality Date   BRAIN SURGERY     Shunt   BREAST LUMPECTOMY Left 09/2008   CARPAL TUNNEL RELEASE Left 2011   LYMPH NODE DISSECTION Left 2010   breast; 2 wks after breast lumpectomy   RENAL DENERVATION N/A 11/20/2022   Procedure: RENAL DENERVATION;  Surgeon: Iran Ouch, MD;  Location: [facility] INVASIVE CV LAB;  Service: Cardiovascular;  Laterality: N/A;   RIGHT/LEFT HEART CATH AND CORONARY ANGIOGRAPHY N/A 05/04/2019    Procedure: RIGHT/LEFT HEART CATH AND CORONARY ANGIOGRAPHY;  Surgeon: Dolores Patty, MD;  Location: [facility]VAS[facility] CV [facility];  Service: Cardiovascular;  Laterality: N/A;  Renal injection preformed    RIGHT/LEFT HEART CATH AND CORONARY ANGIOGRAPHY N/A 10/25/2020   Procedure: RIGHT/LEFT HEART CATH AND CORONARY ANGIOGRAPHY;  Surgeon: Marykay Lex, MD;  Location: [facility] INVASIVE CV LAB;  Service: Cardiovascular;  Laterality: N/A;   TENDON RELEASE Right 2012   "hand"   TUBAL LIGATION  05/11/2011   Procedure: POST PARTUM TUBAL LIGATION;  Surgeon: Scheryl Darter, MD;  Location: [facility];  Service: Gynecology;  Laterality: Bilateral;  Induced for HTN   Family History  Problem Relation Age of Onset   Other Mother 2       breast calcifications treated with surgery, breast cancer pill, and radiation   Diabetes Mother    Fibroids Sister 55       s/p TAH-BSO   Diabetes Maternal Grandmother    Heart disease Maternal Grandmother    Hypertension Maternal Grandmother    CAD Maternal Grandmother    Breast cancer Other        maternal great aunt (MGM's sister)   Breast cancer Other        paternal great aunt dx middle ages; s/p mastectomy   Cancer Neg Hx    Alcohol abuse Neg Hx    Early death Neg Hx    Hyperlipidemia Neg Hx    Kidney disease Neg Hx    Stroke Neg Hx    Colon cancer Neg Hx    Esophageal cancer Neg Hx    Stomach cancer Neg Hx    Rectal cancer Neg Hx    Colon polyps Neg Hx    Outpatient Medications Prior to Visit  Medication Sig Dispense Refill   acetaZOLAMIDE (DIAMOX) 250 MG tablet Take 4 tablets (1,000 mg total) by mouth 3 (three) times daily. 1080 tablet 3   albuterol (PROVENTIL) (2.5 MG/3ML) 0.083% nebulizer solution USE 1 VIAL VIA NEBULIZER EVERY 4 HOURS AND AS NEEDED FOR WHEEZING OR SHORTNESS OF BREATH 75 mL 1   albuterol (VENTOLIN HFA) 108 (90 Base) MCG/ACT inhaler TAKE 2 PUFFS BY MOUTH EVERY 6 HOURS AS NEEDED FOR WHEEZE OR SHORTNESS OF BREATH 8.5 each 3   AMBULATORY NON FORMULARY  MEDICATION Carpal Tunnel Wrist brace. Rt  Carpal Tunnel Syndrome G56.01  Disp 1 1 each 0   AMBULATORY NON FORMULARY MEDICATION Lace up ankle brace (ASO type)  Ankle sprain S93.491A  Disp  1 1 each 0   amitriptyline (ELAVIL) 100 MG tablet Take 2 tablets (200 mg total) by mouth at bedtime. 60 tablet 3   amLODipine (NORVASC) 10 MG tablet TAKE 1 TABLET BY MOUTH EVERY DAY 90 tablet 3   benzonatate (TESSALON) 100 MG capsule Take 1 capsule (100 mg total) by mouth 3 (three) times daily as needed. 21 capsule 0   budesonide-formoterol (SYMBICORT) 160-4.5 MCG/ACT inhaler INHALE 2 PUFFS INTO LUNGS 2 TIMES DAILY AS DIRECTED 10.2 each 5   cetirizine (ZYRTEC ALLERGY) 10 MG tablet Take 1 tablet (10 mg total) by mouth daily as needed for allergies. 30 tablet 0   chlorthalidone (HYGROTON) 25 MG tablet TAKE 1 TABLET (25 MG TOTAL) BY MOUTH DAILY. 90 tablet 1   Cholecalciferol (VITAMIN D) 50 MCG (2000 UT) tablet Take 2,000 Units by mouth daily.     cloNIDine (CATAPRES - DOSED IN MG/24 HR) 0.3 mg/24hr patch PLACE 2 PATCHES ONTO SKIN ONCE WEEKLY 24 patch 2   clopidogrel (PLAVIX) 75 MG tablet TAKE 1 TABLET BY MOUTH EVERY DAY 90 tablet 1   clotrimazole (MYCELEX) 10 MG troche TAKE 1 TABLET BY MOUTH 5 TIMES DAILY. 70 Troche 5   dexlansoprazole (DEXILANT) 60 MG capsule Take 1 capsule (60 mg total) by mouth daily. 90 capsule 2   dextromethorphan-guaiFENesin (MUCINEX DM) 30-600 MG 12hr tablet Take 2 tablets by mouth 2 (two) times daily.     diltiazem (CARDIZEM CD) 360 MG 24 hr capsule Take 1 capsule (360 mg total) by mouth daily. 90 capsule 1   doxazosin (CARDURA) 8 MG tablet TAKE 2 TABLETS BY MOUTH AT BEDTIME 180 tablet 1   DULoxetine (CYMBALTA) 30 MG capsule TAKE 1 CAPSULE (30 MG TOTAL) BY MOUTH DAILY. WITH 60MG  FOR TOTAL OF 90 MG (Patient taking differently: Take 30 mg by mouth See admin instructions. With 60mg  for total of 90 mg) 30 capsule 3   DULoxetine (CYMBALTA) 60 MG capsule TAKE 1 CAPSULE (60 MG TOTAL) BY MOUTH  DAILY. FOR TOTAL OF 90 MG (Patient taking differently: Take 60 mg by mouth See admin instructions. Take with 30 mg for a total of 90 mg in the morning) 30 capsule 3   ENTRESTO 97-103 MG TAKE 1 TABLET BY MOUTH TWICE A DAY 60 tablet 11   EPINEPHRINE 0.3 mg/0.3 mL IJ SOAJ injection INJECT 0.3 MLS INTO THE MUSCLE ONCE AS NEEDED FOR ANAPHYLAXIS 2 Device 2   Evolocumab (REPATHA SURECLICK) 140 MG/ML SOAJ Inject 140 mg into the skin every 14 (fourteen) days. 6 mL 3   ezetimibe (ZETIA) 10 MG tablet Take 10 mg by mouth daily.     famotidine (PEPCID) 20 MG tablet Take 1-2 tablets (20-40 mg total) by mouth at bedtime. (Patient taking differently: Take 20 mg by mouth 4 (four) times daily -  before meals and at bedtime.) 180 tablet 2   gabapentin (NEURONTIN) 300 MG capsule Take 1 capsule (300 mg total) by mouth 3 (three) times daily. One three times daily (Patient taking differently: Take 300 mg by mouth 3 (three) times daily.) 90 capsule 2   gabapentin (NEURONTIN) 300 MG capsule Take 1 capsule (300 mg total) by mouth 3 (three) times daily as needed. 90 capsule 2   hydrALAZINE (APRESOLINE) 50 MG tablet TAKE 1 TABLET BY MOUTH THREE TIMES A DAY 270 tablet 2   lidocaine-prilocaine (EMLA) cream Apply 1 application topically every 28 (twenty-eight) days. Apply 1 application to skin ever 28 days     metoCLOPramide (REGLAN) 10 MG tablet TAKE  1 TABLET BY MOUTH 4 TIMES DAILY. 360 tablet 0   metolazone (ZAROXOLYN) 2.5 MG tablet TAKE 1 TABLET BY MOUTH ONE TIME PER WEEK 12 tablet 2   minoxidil (LONITEN) 2.5 MG tablet TAKE 2 TABLETS (5 MG TOTAL) BY MOUTH 2 (TWO) TIMES DAILY 360 tablet 0   montelukast (SINGULAIR) 10 MG tablet TAKE 1 TABLET BY MOUTH EVERYDAY AT BEDTIME 90 tablet 3   Nebivolol HCl 20 MG TABS TAKE 2 TABLETS BY MOUTH EVERY DAY 180 tablet 2   nitroGLYCERIN (NITROSTAT) 0.4 MG SL tablet PLACE 1 TABLET UNDER THE TONGUE EVERY 5 MINUTES AS NEEDED FOR CHEST PAIN. 25 tablet 1   oxymetazoline (MUCINEX SINUS-MAX FULL FORCE)  0.05 % nasal spray Place 1 spray into both nostrils 2 (two) times daily.     Potassium Chloride ER 20 MEQ TBCR Take 2 tablets (40 mEq total) by mouth 2 (two) times daily. 360 tablet 3   primidone (MYSOLINE) 50 MG tablet Take 0.5 tablets (25 mg total) by mouth at bedtime. 45 tablet 3   promethazine (PHENERGAN) 25 MG tablet TAKE 1 TABLET (25 MG TOTAL) BY MOUTH 3 (THREE) TIMES DAILY AS NEEDED FOR NAUSEA OR VOMITING. 270 tablet 1   promethazine-dextromethorphan (PROMETHAZINE-DM) 6.25-15 MG/5ML syrup Take 5 mLs by mouth 4 (four) times daily as needed for cough. 473 mL 2   ranolazine (RANEXA) 1000 MG SR tablet Take 1 tablet (1,000 mg total) by mouth 2 (two) times daily. 180 tablet 3   Respiratory Therapy Supplies (FLUTTER) DEVI Use as directed 1 each 0   rosuvastatin (CRESTOR) 40 MG tablet TAKE 1 TABLET BY MOUTH EVERY DAY 90 tablet 1   Spacer/Aero-Holding Chambers (AEROCHAMBER MV) inhaler Use as instructed 1 each 0   spironolactone (ALDACTONE) 100 MG tablet TAKE 1 TABLET BY MOUTH EVERY DAY 90 tablet 1   torsemide (DEMADEX) 20 MG tablet Take 2 Tablets by mouth every Morning and 1 Tablet by mouth every Evening 270 tablet 1   triamcinolone (NASACORT) 55 MCG/ACT AERO nasal inhaler Place 2 sprays into the nose daily. 16.9 mL 0   oseltamivir (TAMIFLU) 75 MG capsule Take 1 capsule (75 mg total) by mouth every 12 (twelve) hours. 10 capsule 0   Facility-Administered Medications Prior to Visit  Medication Dose Route Frequency Provider Last Rate Last Admin   goserelin (ZOLADEX) injection 3.6 mg  3.6 mg Subcutaneous Q28 days Magrinat, Valentino Hue, MD   3.6 mg at 06/01/20 1009   Allergies  Allergen Reactions   Aspirin Shortness Of Breath and Palpitations    Pt can take ibuprofen without reaction   Bactrim [Sulfamethoxazole-Trimethoprim] Anaphylaxis    Facial, throat and tongue swelling with difficulty swallowing.   Doxycycline Anaphylaxis   Kiwi Extract Itching and Swelling    Lip swelling   Metoprolol Hives  and Itching   Penicillins Shortness Of Breath and Palpitations    Has patient had a PCN reaction causing immediate rash, facial/tongue/throat swelling, SOB or lightheadedness with hypotension: Yes Has patient had a PCN reaction causing severe rash involving mucus membranes or skin necrosis: Yes Has patient had a PCN reaction that required hospitalization Yes Has patient had a PCN reaction occurring within the last 10 years: No If all of the above answers are "NO", then may proceed with Cephalosporin use.   Strawberry Extract Hives     ROS: A complete ROS was performed with pertinent positives/negatives noted in the HPI. The remainder of the ROS are negative.    Objective:   Today's Vitals  06/13/23 0807  BP: (!) 165/89  Pulse: 87  SpO2: 99%  Weight: 217 lb 9.6 oz (98.7 kg)  Height: 5\' 5"  (1.651 m)    Physical Exam          GENERAL: Well-appearing, in NAD. Well nourished.  SKIN: Pink, warm and dry. No rash, lesion, ulceration, or ecchymoses.  Head: Normocephalic. NECK: Trachea midline. Full ROM w/o pain or tenderness. RESPIRATORY: Chest wall symmetrical. Respirations even and non-labored. Breath sounds clear to auscultation bilaterally.  CARDIAC: S1, S2 present, regular rate and rhythm without murmur or gallops. Peripheral pulses 2+ bilaterally.  MSK: Muscle tone and strength appropriate for age.  NEUROLOGIC: No motor or sensory deficits. Steady, even gait. C2-C12 intact.  PSYCH/MENTAL STATUS: Alert, oriented x 3. Cooperative, appropriate mood and affect.     Results for orders placed or performed in visit on 06/13/23  POCT glycosylated hemoglobin (Hb A1C)   Collection Time: 06/13/23  8:34 AM  Result Value Ref Range   Hemoglobin A1C 6.0 (A) 4.0 - 5.6 %   HbA1c POC (<> result, manual entry) 6.0 4.0 - 5.6 %   HbA1c, POC (prediabetic range) 6.0 5.7 - 6.4 %   HbA1c, POC (controlled diabetic range)     *Note: Due to a large number of results and/or encounters for the  requested time period, some results have not been displayed. A complete set of results can be found in Results Review.    Assessment & Plan:   1. Prediabetes (Primary) Controlled. Continue monitoring of diet and reviewed recommended diet with patient.  - POCT glycosylated hemoglobin (Hb A1C)  2. Moderate episode of recurrent major depressive disorder (HCC) 3. GAD (generalized anxiety disorder) Controlled per patient. Discussed ongoing stressors and patient declined medication change. She would like to remain on current medication regimen. Counseling recommended. Safety plan reviewed.    No orders of the defined types were placed in this encounter.  Lab Orders         POCT glycosylated hemoglobin (Hb A1C)      Return in about 6 months (around 12/14/2023).    Patient to reach out to office if new, worrisome, or unresolved symptoms arise or if no improvement in patient's condition. Patient verbalized understanding and is agreeable to treatment plan. All questions answered to patient's satisfaction.    Hilbert Bible, [place]

## 2023-06-13 NOTE — Patient Instructions (Signed)
 Coricidin HBP.

## 2023-07-01 ENCOUNTER — Ambulatory Visit: Admitting: Family Medicine

## 2023-07-01 ENCOUNTER — Other Ambulatory Visit: Payer: Self-pay

## 2023-07-01 ENCOUNTER — Inpatient Hospital Stay: Payer: Medicare Other | Attending: Adult Health

## 2023-07-01 VITALS — BP 154/90 | HR 92 | Ht 65.0 in | Wt 219.0 lb

## 2023-07-01 VITALS — BP 154/90 | HR 92 | Temp 98.6°F

## 2023-07-01 DIAGNOSIS — Z171 Estrogen receptor negative status [ER-]: Secondary | ICD-10-CM | POA: Insufficient documentation

## 2023-07-01 DIAGNOSIS — Z1732 Human epidermal growth factor receptor 2 negative status: Secondary | ICD-10-CM | POA: Diagnosis not present

## 2023-07-01 DIAGNOSIS — Z1722 Progesterone receptor negative status: Secondary | ICD-10-CM | POA: Insufficient documentation

## 2023-07-01 DIAGNOSIS — M25561 Pain in right knee: Secondary | ICD-10-CM | POA: Diagnosis not present

## 2023-07-01 DIAGNOSIS — C50412 Malignant neoplasm of upper-outer quadrant of left female breast: Secondary | ICD-10-CM | POA: Insufficient documentation

## 2023-07-01 DIAGNOSIS — G8929 Other chronic pain: Secondary | ICD-10-CM | POA: Diagnosis not present

## 2023-07-01 DIAGNOSIS — Z5111 Encounter for antineoplastic chemotherapy: Secondary | ICD-10-CM | POA: Diagnosis present

## 2023-07-01 DIAGNOSIS — M5416 Radiculopathy, lumbar region: Secondary | ICD-10-CM | POA: Diagnosis not present

## 2023-07-01 MED ORDER — GOSERELIN ACETATE 3.6 MG ~~LOC~~ IMPL
3.6000 mg | DRUG_IMPLANT | SUBCUTANEOUS | Status: DC
Start: 2023-07-01 — End: 2023-07-01
  Administered 2023-07-01: 3.6 mg via SUBCUTANEOUS
  Filled 2023-07-01: qty 3.6

## 2023-07-01 NOTE — Progress Notes (Signed)
 Rubin Payor, PhD, LAT, ATC acting as a scribe for Clementeen Graham, MD.  Erica [name] is a 48 y.o. female who presents to [facility] at [facility] today for exacerbation of her R knee pain. Pt was last seen for her knee on 02/11/23 and was prescribed gabapentin and prednisone.  Today, pt reports pain in her R knee never stopped. R knee is started to "give out" on her. Pain is disturbing her sleep at night. Pt locates pain to the anterior aspect of her R knee and a "burning" pain laterally. +swelling. LBP continues.  Additionally she notes chronic back pain.  She notes bilateral low back pain without radiation.  This has been ongoing for months.  She had renal denervation last year and has had back pain since.  Dx imaging: 02/11/23 R knee XR  Pertinent review of systems: No fevers or chills  Relevant historical information: Difficult to control hypertension.  History of breast cancer.   Exam:  BP (!) 154/90   Pulse 92   Ht 5\' 5"  (1.651 m)   Wt 219 lb (99.3 kg)   LMP  (LMP Unknown)   SpO2 100%   BMI 36.44 kg/m  General: Well Developed, well nourished, and in no acute distress.   MSK: Right knee minimal effusion normal motion with crepitation tender palpation anterior knee.  L-spine nontender palpation midline.  Nontender paraspinal musculature. Normal motion.  Lower extremity strength is intact.    Lab and Radiology Results  Procedure: Real-time Ultrasound Guided Injection of right knee joint superior lateral patella space Device: Philips Affiniti 50G/GE Logiq Images permanently stored and available for review in PACS Verbal informed consent obtained.  Discussed risks and benefits of procedure. Warned about infection, bleeding, hyperglycemia damage to structures among others. Patient expresses understanding and agreement Time-out conducted.   Noted no overlying erythema, induration, or other signs of local infection.   Skin prepped in a sterile  fashion.   Local anesthesia: Topical Ethyl chloride.   With sterile technique and under real time ultrasound guidance: 40 mg of Kenalog and 2 mL of Marcaine injected into knee joint. Fluid seen entering the joint capsule.   Completed without difficulty   Pain immediately resolved suggesting accurate placement of the medication.   Advised to call if fevers/chills, erythema, induration, drainage, or persistent bleeding.   Images permanently stored and available for review in the ultrasound unit.  Impression: Technically successful ultrasound guided injection.     EXAM: RIGHT KNEE 3 VIEWS   COMPARISON:  None Available.   FINDINGS: Normal alignment and joint spaces. There is mild patellofemoral spurring and subchondral cystic change. No fracture. No erosions or focal bone abnormality. Trace knee joint effusion. Unremarkable soft tissues.   IMPRESSION: Mild patellofemoral osteoarthritis. Trace knee joint effusion.     Electronically Signed   By: Narda Rutherford M.D.   On: 02/27/2023 12:58  EXAM: LUMBAR SPINE - 2-3 VIEW   COMPARISON:  None Available.   FINDINGS: Five non-rib-bearing lumbar vertebra. Normal lumbar alignment. Normal vertebral body heights, no fracture or compression deformity. Minor anterior spurring at multiple levels, mild disc space narrowing at L3-L4 and L5-S1. Mild L5-S1 facet hypertrophy. No evidence of focal bone abnormality. Shunt catheter tubing projects over the central abdomen. Tubal ligation clips in the pelvis.   IMPRESSION: Mild degenerative disc disease at L3-L4 and L5-S1. Mild L5-S1 facet hypertrophy.     Electronically Signed   By: Narda Rutherford M.D.   On: 02/27/2023 12:49  Assessment and Plan: 48 y.o. female with chronic right knee pain and chronic low back pain.  X-rays for both issues are only significant for mild degenerative changes.  Plan for steroid injection right knee.  For chronic low back pain plan for trial of physical  therapy. If low back pain does not improve consider MRI.  PDMP not reviewed this encounter. Orders Placed This Encounter  Procedures   [place] LIMITED JOINT SPACE STRUCTURES LOW RIGHT(NO LINKED CHARGES)    Reason for Exam (SYMPTOM  OR DIAGNOSIS REQUIRED):   right knee pain    Preferred imaging location?:   [facility] referral to Physical Therapy    Referral Priority:   Routine    Referral Type:   Physical Medicine    Referral Reason:   Specialty Services Required    Requested Specialty:   Physical Therapy    Number of Visits Requested:   1   No orders of the defined types were placed in this encounter.    Discussed warning signs or symptoms. Please see discharge instructions. Patient expresses understanding.   The above documentation has been reviewed and is accurate and complete Clementeen Graham, M.D.

## 2023-07-01 NOTE — Patient Instructions (Addendum)
 Thank you for coming in today.   You received an injection today. Seek immediate medical attention if the joint becomes red, extremely painful, or is oozing fluid.   I've referred you to Physical Therapy.  Let us know if you don't hear from them in one week.  If not better, let me know and I can order a MRI

## 2023-07-01 NOTE — Therapy (Signed)
 OUTPATIENT PHYSICAL THERAPY EVALUATION   Patient Name: Erica [name] MRN: [id number] DOB:February 04, 1976, 48 y.o., female Today's Date: 07/03/2023   END OF SESSION:  PT End of Session - 07/03/23 0842     Visit Number 1    Number of Visits 17    Date for PT Re-Evaluation 08/28/23    Authorization Type [employer] MCR    Progress Note Due on Visit 10    PT Start Time 0845    PT Stop Time 0935    PT Time Calculation (min) 50 min    Activity Tolerance Patient tolerated treatment well    Behavior During Therapy [facility] for tasks assessed/performed             Past Medical History:  Diagnosis Date   Allergy    Anxiety    Apnea, sleep 05/04/2014   AHI 31/hr now on CPAP at 12cm H2O   Arthritis    Asthma    Asthma without status asthmaticus 01/19/2016   Axillary hidradenitis suppurativa 08/01/2011   Blood transfusion without reported diagnosis    Breast cancer (HCC) 2010   a. locally advanced left breast carcinoma diagnosed in 2010 and treated with neoadjuvant chemotherapy with docetaxel and cyclophosphamide as well as paclitaxel. This was followed by radiation therapy which was completed in 2011.   Chemotherapy-induced peripheral neuropathy (HCC) 12/09/2016   CHF (congestive heart failure) (HCC)    Chronic diastolic CHF (congestive heart failure) (HCC) 06/23/2013   Chronic kidney disease    Chronic pain in left foot 06/29/2018   Chronic stable angina (HCC) 08/22/2020   Cough variant asthma 09/27/2010   Followed in Pulmonary clinic/ [facility]/ [place]  Onset reported in childhood  - PFT's 01/31/2011 FEV1  1.76 (60% ) ratio 68 and no better p B2,  DLCO 70% -med review 09/25/2012 > [place] drug assistance  paperwork given    - HFA 90% 11/20/2012 .  -Med calendar 12/07/12 , 04/06/2013 , 04/18/2014,, 08/30/2014 , 11/08/2014  - PFT's 01/12/2013  1.73 (65%)  Ratio 73 and no better p B2, DLCO 86% - trial off    Depression with somatization 06/15/2015   Dyspnea on exertion    a. 01/2013 Lexi MV: EF 54%,  no ischemia/infarct;  b. 05/2013 Echo: EF 60-65%, no rwma, Gr 2 DD;  b.    Essential hypertension 10/09/2014   Estimated Creatinine Clearance: 82.4 mL/min (by C-G formula based on SCr of 0.98 mg/dL).   Fatty liver    Gastroparesis    GERD (gastroesophageal reflux disease)    Hyperglycemia 04/13/2011   Hypertension    Hypertensive heart disease 09/26/2015   Hypertriglyceridemia without hypercholesterolemia 06/15/2015   Framingham risk score is 1%    Idiopathic intracranial hypertension 10/21/2014   Impaired glucose tolerance 04/13/2011   Insomnia secondary to anxiety 04/05/2015   Lymphadenitis, chronic    restricted LEFT extremity   Malignant neoplasm of upper-outer quadrant of left breast in female, estrogen receptor negative (HCC) 01/07/2013   Stage III, Invasive Ductal, s/p partial Left Mastectomy/Lumpectomy (baseball sized) with Lymph Node dissection, had Chemo and RadRx     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   OSA (obstructive sleep apnea) 05/04/2014   AHI 31/hr now on CPAP at 12cm H2O   Oxygen deficiency    Uses 02 in CPAP   Persistent proteinuria 03/31/2018   Personal history of chemotherapy 2010   Personal history of radiation therapy 2010   Resistant hypertension 09/17/2011       Sleep apnea  SVT (supraventricular tachycardia) (HCC) 08/22/2020   Tibialis posterior tendinitis, left 07/15/2018   Injected in March 10, 2019   Ulcer    Upper airway cough syndrome 10/09/2014   Onset around 2012 with assoc pseudowheeze MRI 05/18/15 > Paranasal sinuses are clear - 11/17/2015 rec increase h1 to 2 hs to help with noct cough  - Allergy profile 11/17/2015 >  Eos 0.1 /  IgE  80 POS RAST  to grass, ragweed, cedar tree  - d/c acei 03/06/2018  - flare end of Jan 2020 in setting of ? Uri/ poorly controlled GERD - flare mid Oct 2020 with uri / assoc secondary gerd   Vitamin D deficiency disease 03/31/2018   Past Surgical History:  Procedure Laterality Date   BRAIN SURGERY     Shunt    BREAST LUMPECTOMY Left 09/2008   CARPAL TUNNEL RELEASE Left 2011   LYMPH NODE DISSECTION Left 2010   breast; 2 wks after breast lumpectomy   RENAL DENERVATION N/A 11/20/2022   Procedure: RENAL DENERVATION;  Surgeon: Iran Ouch, MD;  Location: [facility] INVASIVE CV LAB;  Service: Cardiovascular;  Laterality: N/A;   RIGHT/LEFT HEART CATH AND CORONARY ANGIOGRAPHY N/A 05/04/2019   Procedure: RIGHT/LEFT HEART CATH AND CORONARY ANGIOGRAPHY;  Surgeon: Dolores Patty, MD;  Location: [facility] INVASIVE CV LAB;  Service: Cardiovascular;  Laterality: N/A;  Renal injection preformed    RIGHT/LEFT HEART CATH AND CORONARY ANGIOGRAPHY N/A 10/25/2020   Procedure: RIGHT/LEFT HEART CATH AND CORONARY ANGIOGRAPHY;  Surgeon: Marykay Lex, MD;  Location[facility] INVASIVE CV LAB;  Service: Cardiovascular;  Laterality: N/A;   TENDON RELEASE Right 2012   "hand"   TUBAL LIGATION  05/11/2011   Procedure: POST PARTUM TUBAL LIGATION;  Surgeon: Scheryl Darter, MD;  Location: [facility] ORS;  Service: Gynecology;  Laterality: Bilateral;  Induced for HTN   Patient Active Problem List   Diagnosis Date Noted   Prediabetes 06/13/2023   Moderate episode of recurrent major depressive disorder (HCC) 02/04/2023   GAD (generalized anxiety disorder) 02/04/2023   Chronic respiratory failure with hypoxia (HCC) 12/31/2021   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   Coronary artery disease    Chronic stable angina (HCC) 08/22/2020   SVT (supraventricular tachycardia) (HCC) 08/22/2020   Palpitations 02/28/2020   Right ankle pain 04/14/2019   OSA (obstructive sleep apnea) 10/01/2018   Tibialis posterior tendinitis, left 07/15/2018   Chronic pain in left foot 06/29/2018   Vitamin D deficiency disease 03/31/2018   Persistent proteinuria 03/31/2018   Hyperlipidemia LDL goal <100 03/30/2018   Chemotherapy-induced peripheral neuropathy (HCC) 12/09/2016   Asthma in adult, mild persistent, uncomplicated 01/19/2016   Hypertensive heart disease 09/26/2015   Depression  with somatization 06/15/2015   Hypertriglyceridemia without hypercholesterolemia 06/15/2015   Hypokalemia 06/15/2015   Insomnia secondary to anxiety 04/05/2015   Idiopathic intracranial hypertension 10/21/2014   Upper airway cough syndrome 10/09/2014   Malignant hypertension 10/09/2014   Morbid obesity due to excess calories (HCC) 10/09/2014   Apnea, sleep 05/04/2014   Acute on chronic diastolic CHF (congestive heart failure), NYHA class 2 (HCC) 05/03/2014   DOE (dyspnea on exertion) 07/14/2013   Acute on chronic diastolic heart failure (HCC) 06/23/2013   Malignant neoplasm of upper-outer quadrant of left breast in female, estrogen receptor negative (HCC) 01/07/2013   Gastroparesis 01/04/2013   Resistant hypertension 09/17/2011   Axillary hidradenitis suppurativa 08/01/2011   Preventative health care 04/13/2011   Hyperglycemia 04/13/2011   GERD (gastroesophageal reflux disease) 03/18/2011   Cough variant asthma 09/27/2010  PCP: Hilbert Bible, FNP  REFERRING PROVIDER: Rodolph Bong, MD  REFERRING DIAG: Lumbar radiculopathy  Rationale for Evaluation and Treatment: Rehabilitation  THERAPY DIAG:  Other low back pain  Pain in right leg  Muscle weakness (generalized)  ONSET DATE: August 2024   SUBJECTIVE:     SUBJECTIVE STATEMENT: Patients reports she had renal denervation done in August 2024 and has been in pain ever since. Pain is located across the lower back, she was told it would hurt for 3-4 months but pain persisted in the lower back. The right knee pain started after in October or November, and it will keep her up at night. She states it feels heavy in her lower back, like labor pain that never stops. The pain will start in the right lower back region and then will feel like lightning shooting down the right leg to the back and outside of the right knee, pain does not go past the knee. She leans on the bed for relief, and she states getting on her stomach gives  her the most relief, but she has a lot of pain when getting up. She does use a heating pad as well. She reports that to help with the knee pain at night she has to have her leg up on the wall. She states she moves around a lot because sitting or standing for long periods will make it worse. Will occasionally have trouble bending down and she states the right leg has given out on her causing her to fall. She does have trouble going up/down stairs, she turns to the side when going down, and she also has breathing issues so when she goes up stairs to get inside she has to immediately get on oxygen. She reports inability to play with or lift her grandchildren and difficulty completing activities around her home for her son.  PERTINENT HISTORY:  See PMH above  PAIN:  Are you having pain? Yes:  NPRS scale: 5-6/10 baseline, 10/10 at worst Pain location: Lower back and right leg/knee Pain description: Heavy, labor pains, constant Aggravating factors: Standing, sitting, activity Relieving factors: Lying on stomach, leaning on bed or counter, heat  PRECAUTIONS: Fall, patient reports she was told not to lift more than 10 lbs  RED FLAGS: None   WEIGHT BEARING RESTRICTIONS: No  FALLS:  Has patient fallen in last 6 months? Yes. Number of falls 3-4, right knee gives out or sharp pains could cause her to fall  LIVING ENVIRONMENT: Lives with: lives with their family Lives in: House/apartment Stairs: Yes: External: 6 steps; on left going up  PLOF: Independent  PATIENT GOALS: Pain relief, get back to doing normal things   OBJECTIVE:  Note: Objective measures were completed at Evaluation unless otherwise noted. PATIENT SURVEYS:  Modified Oswestry 37/50 (74% disability)   COGNITION: Overall cognitive status: Within functional limits for tasks assessed     SENSATION: Patient reports neuropathy of feet and hands  MUSCLE LENGTH: Unable to fully assess, severe increase in right lower back and leg  pain when attempting to assess hamstring length  POSTURE:  Rounded shoulder posture, normal lumbar lordosis, weight shift toward left and patient frequently changing positions while seated for comfort  PALPATION: Exquisite tenderness to right lower lumbar paraspinals, right gluteal and TFL region, right lateral thigh and knee; moderate tenderness to left lumbar paraspinals  Unable to assess lumbar mobility  LUMBAR ROM:   AROM eval  Flexion   Extension   Right lateral flexion  Left lateral flexion   Right rotation   Left rotation    (Blank rows = not tested)  LOWER EXTREMITY ROM:    Limitations in right hip ROM due to pain and guarding, right knee flexion limited due to pain  LOWER EXTREMITY MMT:    MMT Right eval Left eval  Hip flexion 3+ 4  Hip extension 2 3-  Hip abduction 2   Hip adduction    Hip internal rotation    Hip external rotation    Knee flexion 4- 4  Knee extension 4- 4+  Ankle dorsiflexion    Ankle plantarflexion    Ankle inversion    Ankle eversion     (Blank rows = not tested)  LUMBAR SPECIAL TESTS:  SLR positive on right  FUNCTIONAL TESTS:  Sit to stand: patient will difficulty standing from standard chair, requires UE for support with transferring from sit to stand  GAIT: Assistive device utilized: None Level of assistance: Complete Independence Comments: Antalgic on right   TREATMENT [facility] Adult PT Treatment:                                                DATE: 07/03/2023 Education on possible etiologies of symptoms that are likely multifactorial and variety of treatments that could be utilized for symptoms management, progression of mobility and strength, and gradual return to activity Electrical Stimulation combined with cold pack Location: Lumbar  Action: IFC Parameters: Standard settings, intensity to patient tolerance, 15 min Goals: Pain relief and Reduced muscle tension   PATIENT EDUCATION:  Education details: Exam findings,  POC, HEP Person educated: Patient Education method: Explanation, Demonstration, Tactile cues, Verbal cues, and Handouts Education comprehension: verbalized understanding, returned demonstration, verbal cues required, tactile cues required, and needs further education  HOME EXERCISE PROGRAM: None provided at eval   ASSESSMENT: CLINICAL IMPRESSION: Patient is a 48 y.o. female who was seen today for physical therapy evaluation and treatment for chronic lower back and right knee pain. Her symptoms do seem consistent with nonspecific lower back pain and right sided lumbar radiculopathy. Evaluation was limited this visit due to patient's high symptom severity and irritability level. She did become tearful during the evaluation. Trial of e-stim IFC combined with cold pack with patient reporting pain level reduction, and provided patient with information for purchasing home TENS unit for pain relief. Unable to provide patient with HEP this visit due to inability to complete exercise without severe pain. Patient was provided extensive education on possible etiologies of her symptoms and treatments that could be utilized in therapy, and gradual progression for return to activity.    OBJECTIVE IMPAIRMENTS: Abnormal gait, decreased activity tolerance, decreased balance, decreased ROM, decreased strength, impaired flexibility, postural dysfunction, and pain.   ACTIVITY LIMITATIONS: carrying, lifting, bending, sitting, standing, squatting, sleeping, stairs, transfers, bed mobility, locomotion level, and caring for others  PARTICIPATION LIMITATIONS: meal prep, cleaning, laundry, driving, shopping, and community activity  PERSONAL FACTORS: Fitness, Past/current experiences, Time since onset of injury/illness/exacerbation, and 3+ comorbidities: see PMH above  are also affecting patient's functional outcome.   REHAB POTENTIAL: Good  CLINICAL DECISION MAKING: Evolving/moderate complexity  EVALUATION  COMPLEXITY: Moderate   GOALS: Goals reviewed with patient? Yes  SHORT TERM GOALS: Target date: 07/31/2023  Patient will be I with initial HEP in order to progress with therapy. Baseline: None provided at eval Goal  status: INITIAL  2.  Patient will report low back and right leg pain </= 7/10 at worst to reduce functional limitations Baseline: 10/10 pain at worst Goal status: INITIAL  3.  Patient will be able to perform sit to stand without difficulty or using UE for support to improve mobility and indicate improvement in LE strength Baseline: requires UE for support with standing from chair Goal status: INITIAL  LONG TERM GOALS: Target date: 08/28/2023  Patient will be I with final HEP to maintain progress from PT. Baseline: None provided at eval Goal status: INITIAL  2.  Patient will report Modified Oswestry </= 25/50 (50% disability) in order to indicate an improvement in their functional status Baseline: 37/50 (74% disability) Goal status: INITIAL  3.  Patient will demonstrate improvement in core and hip strength grossly >/= 4-/5 MMT in order to improve her activity tolerance with household tasks Baseline: see limitations above Goal status: INITIAL  4.  Patient will demonstrate right knee strength >/= 4+/5 MMT in order to improve stair negotiation and reduce fall risk due to right knee giving out Baseline: see limitations above Goal status: INITIAL  5. Patient will report lower back and right leg pain </= 4/10 in order to reduce functional limitations   PLAN: PT FREQUENCY: 1-2x/week  PT DURATION: 8 weeks  PLANNED INTERVENTIONS: 97164- PT Re-evaluation, 97110-Therapeutic exercises, 97530- Therapeutic activity, 97112- Neuromuscular re-education, 97535- Self Care, 16109- Manual therapy, [id number]- Electrical stimulation (unattended), [phone]- Electrical stimulation (manual), Patient/Family education, Balance training, Taping, Dry Needling, Joint mobilization, Joint manipulation,  Spinal manipulation, Spinal mobilization, Cryotherapy, and Moist heat.  PLAN FOR NEXT SESSION: Review HEP and progress PRN, continue with modalities for pain relief as needed, manual/TPDN for right lumbar and gluteal region, trial LAD, initiate gentle stretching and mobility for lumbar, initiate gentle core activation, hip and LE strengthening as tolerated    Rosana Hoes, PT, DPT, LAT, ATC 07/03/23  10:15 AM Phone: [phone] Fax: [phone]

## 2023-07-03 ENCOUNTER — Encounter: Payer: Self-pay | Admitting: Physical Therapy

## 2023-07-03 ENCOUNTER — Ambulatory Visit (INDEPENDENT_AMBULATORY_CARE_PROVIDER_SITE_OTHER): Admitting: Physical Therapy

## 2023-07-03 ENCOUNTER — Other Ambulatory Visit: Payer: Self-pay

## 2023-07-03 DIAGNOSIS — M79604 Pain in right leg: Secondary | ICD-10-CM

## 2023-07-03 DIAGNOSIS — M6281 Muscle weakness (generalized): Secondary | ICD-10-CM | POA: Diagnosis not present

## 2023-07-03 DIAGNOSIS — M5459 Other low back pain: Secondary | ICD-10-CM

## 2023-07-03 NOTE — Patient Instructions (Signed)
 Patient provided handout for purchasing TENS unit from [employer]

## 2023-07-07 NOTE — Therapy (Signed)
 OUTPATIENT PHYSICAL THERAPY TREATMENT   Patient Name: MARIELLA BLACKWELDER MRN: [id number] DOB:May 23, 1975, 48 y.o., female Today's Date: 07/08/2023   END OF SESSION:  PT End of Session - 07/08/23 0851     Visit Number 2    Number of Visits 17    Date for PT Re-Evaluation 08/28/23    Authorization Type [employer] MCR    Progress Note Due on Visit 10    PT Start Time [phone]    PT Stop Time 0945    PT Time Calculation (min) 59 min    Activity Tolerance Patient limited by pain    Behavior During Therapy [facility] for tasks assessed/performed              Past Medical History:  Diagnosis Date   Allergy    Anxiety    Apnea, sleep 05/04/2014   AHI 31/hr now on CPAP at 12cm H2O   Arthritis    Asthma    Asthma without status asthmaticus 01/19/2016   Axillary hidradenitis suppurativa 08/01/2011   Blood transfusion without reported diagnosis    Breast cancer (HCC) 2010   a. locally advanced left breast carcinoma diagnosed in 2010 and treated with neoadjuvant chemotherapy with docetaxel and cyclophosphamide as well as paclitaxel. This was followed by radiation therapy which was completed in 2011.   Chemotherapy-induced peripheral neuropathy (HCC) 12/09/2016   CHF (congestive heart failure) (HCC)    Chronic diastolic CHF (congestive heart failure) (HCC) 06/23/2013   Chronic kidney disease    Chronic pain in left foot 06/29/2018   Chronic stable angina (HCC) 08/22/2020   Cough variant asthma 09/27/2010   Followed in Pulmonary clinic/ [facility]/ [place]  Onset reported in childhood  - PFT's 01/31/2011 FEV1  1.76 (60% ) ratio 68 and no better p B2,  DLCO 70% -med review 09/25/2012 > [place] drug assistance  paperwork given    - HFA 90% 11/20/2012 .  -Med calendar 12/07/12 , 04/06/2013 , 04/18/2014,, 08/30/2014 , 11/08/2014  - PFT's 01/12/2013  1.73 (65%)  Ratio 73 and no better p B2, DLCO 86% - trial off    Depression with somatization 06/15/2015   Dyspnea on exertion    a. 01/2013 Lexi MV: EF 54%, no  ischemia/infarct;  b. 05/2013 Echo: EF 60-65%, no rwma, Gr 2 DD;  b.    Essential hypertension 10/09/2014   Estimated Creatinine Clearance: 82.4 mL/min (by C-G formula based on SCr of 0.98 mg/dL).   Fatty liver    Gastroparesis    GERD (gastroesophageal reflux disease)    Hyperglycemia 04/13/2011   Hypertension    Hypertensive heart disease 09/26/2015   Hypertriglyceridemia without hypercholesterolemia 06/15/2015   Framingham risk score is 1%    Idiopathic intracranial hypertension 10/21/2014   Impaired glucose tolerance 04/13/2011   Insomnia secondary to anxiety 04/05/2015   Lymphadenitis, chronic    restricted LEFT extremity   Malignant neoplasm of upper-outer quadrant of left breast in female, estrogen receptor negative (HCC) 01/07/2013   Stage III, Invasive Ductal, s/p partial Left Mastectomy/Lumpectomy (baseball sized) with Lymph Node dissection, had Chemo and RadRx     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   OSA (obstructive sleep apnea) 05/04/2014   AHI 31/hr now on CPAP at 12cm H2O   Oxygen deficiency    Uses 02 in CPAP   Persistent proteinuria 03/31/2018   Personal history of chemotherapy 2010   Personal history of radiation therapy 2010   Resistant hypertension 09/17/2011       Sleep apnea  SVT (supraventricular tachycardia) (HCC) 08/22/2020   Tibialis posterior tendinitis, left 07/15/2018   Injected in March 10, 2019   Ulcer    Upper airway cough syndrome 10/09/2014   Onset around 2012 with assoc pseudowheeze MRI 05/18/15 > Paranasal sinuses are clear - 11/17/2015 rec increase h1 to 2 hs to help with noct cough  - Allergy profile 11/17/2015 >  Eos 0.1 /  IgE  80 POS RAST  to grass, ragweed, cedar tree  - d/c acei 03/06/2018  - flare end of Jan 2020 in setting of ? Uri/ poorly controlled GERD - flare mid Oct 2020 with uri / assoc secondary gerd   Vitamin D deficiency disease 03/31/2018   Past Surgical History:  Procedure Laterality Date   BRAIN SURGERY     Shunt    BREAST LUMPECTOMY Left 09/2008   CARPAL TUNNEL RELEASE Left 2011   LYMPH NODE DISSECTION Left 2010   breast; 2 wks after breast lumpectomy   RENAL DENERVATION N/A 11/20/2022   Procedure: RENAL DENERVATION;  Surgeon: Iran Ouch, MD;  Location: [facility] INVASIVE CV LAB;  Service: Cardiovascular;  Laterality: N/A;   RIGHT/LEFT HEART CATH AND CORONARY ANGIOGRAPHY N/A 05/04/2019   Procedure: RIGHT/LEFT HEART CATH AND CORONARY ANGIOGRAPHY;  Surgeon: Dolores Patty, MD;  Location: [facility] INVASIVE CV LAB;  Service: Cardiovascular;  Laterality: N/A;  Renal injection preformed    RIGHT/LEFT HEART CATH AND CORONARY ANGIOGRAPHY N/A 10/25/2020   Procedure: RIGHT/LEFT HEART CATH AND CORONARY ANGIOGRAPHY;  Surgeon: Marykay Lex, MD;  Location: [facility] INVASIVE CV LAB;  Service: Cardiovascular;  Laterality: N/A;   TENDON RELEASE Right 2012   "hand"   TUBAL LIGATION  05/11/2011   Procedure: POST PARTUM TUBAL LIGATION;  Surgeon: Scheryl Darter, MD;  Location: [facility] ORS;  Service: Gynecology;  Laterality: Bilateral;  Induced for HTN   Patient Active Problem List   Diagnosis Date Noted   Prediabetes 06/13/2023   Moderate episode of recurrent major depressive disorder (HCC) 02/04/2023   GAD (generalized anxiety disorder) 02/04/2023   Chronic respiratory failure with hypoxia (HCC) 12/31/2021   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   Coronary artery disease    Chronic stable angina (HCC) 08/22/2020   SVT (supraventricular tachycardia) (HCC) 08/22/2020   Palpitations 02/28/2020   Right ankle pain 04/14/2019   OSA (obstructive sleep apnea) 10/01/2018   Tibialis posterior tendinitis, left 07/15/2018   Chronic pain in left foot 06/29/2018   Vitamin D deficiency disease 03/31/2018   Persistent proteinuria 03/31/2018   Hyperlipidemia LDL goal <100 03/30/2018   Chemotherapy-induced peripheral neuropathy (HCC) 12/09/2016   Asthma in adult, mild persistent, uncomplicated 01/19/2016   Hypertensive heart disease 09/26/2015   Depression  with somatization 06/15/2015   Hypertriglyceridemia without hypercholesterolemia 06/15/2015   Hypokalemia 06/15/2015   Insomnia secondary to anxiety 04/05/2015   Idiopathic intracranial hypertension 10/21/2014   Upper airway cough syndrome 10/09/2014   Malignant hypertension 10/09/2014   Morbid obesity due to excess calories (HCC) 10/09/2014   Apnea, sleep 05/04/2014   Acute on chronic diastolic CHF (congestive heart failure), NYHA class 2 (HCC) 05/03/2014   DOE (dyspnea on exertion) 07/14/2013   Acute on chronic diastolic heart failure (HCC) 06/23/2013   Malignant neoplasm of upper-outer quadrant of left breast in female, estrogen receptor negative (HCC) 01/07/2013   Gastroparesis 01/04/2013   Resistant hypertension 09/17/2011   Axillary hidradenitis suppurativa 08/01/2011   Preventative health care 04/13/2011   Hyperglycemia 04/13/2011   GERD (gastroesophageal reflux disease) 03/18/2011   Cough variant asthma 09/27/2010  PCP: Hilbert Bible, FNP  REFERRING PROVIDER: Rodolph Bong, MD  REFERRING DIAG: Lumbar radiculopathy  Rationale for Evaluation and Treatment: Rehabilitation  THERAPY DIAG:  Other low back pain  Pain in right leg  Muscle weakness (generalized)  ONSET DATE: August 2024   SUBJECTIVE:     SUBJECTIVE STATEMENT: Patient reports she is doing ok. She did have to stay in the bed this past Saturday but was able to get out and do some things around the house on Sunday.    Eval: Patients reports she had renal denervation done in August 2024 and has been in pain ever since. Pain is located across the lower back, she was told it would hurt for 3-4 months but pain persisted in the lower back. The right knee pain started after in October or November, and it will keep her up at night. She states it feels heavy in her lower back, like labor pain that never stops. The pain will start in the right lower back region and then will feel like lightning shooting down  the right leg to the back and outside of the right knee, pain does not go past the knee. She leans on the bed for relief, and she states getting on her stomach gives her the most relief, but she has a lot of pain when getting up. She does use a heating pad as well. She reports that to help with the knee pain at night she has to have her leg up on the wall. She states she moves around a lot because sitting or standing for long periods will make it worse. Will occasionally have trouble bending down and she states the right leg has given out on her causing her to fall. She does have trouble going up/down stairs, she turns to the side when going down, and she also has breathing issues so when she goes up stairs to get inside she has to immediately get on oxygen. She reports inability to play with or lift her grandchildren and difficulty completing activities around her home for her son.  PERTINENT HISTORY:  See PMH above  PAIN:  Are you having pain? Yes:  NPRS scale: 6/10 currently, 10/10 at worst Pain location: Lower back and right leg/knee Pain description: Heavy, labor pains, constant Aggravating factors: Standing, sitting, activity Relieving factors: Lying on stomach, leaning on bed or counter, heat  PRECAUTIONS: Fall, patient reports she was told not to lift more than 10 lbs  FALLS:  Has patient fallen in last 6 months? Yes. Number of falls 3-4, right knee gives out or sharp pains could cause her to fall  PATIENT GOALS: Pain relief, get back to doing normal things   OBJECTIVE:  Note: Objective measures were completed at Evaluation unless otherwise noted. PATIENT SURVEYS:  Modified Oswestry 37/50 (74% disability)   SENSATION: Patient reports neuropathy of feet and hands  MUSCLE LENGTH: Unable to fully assess, severe increase in right lower back and leg pain when attempting to assess hamstring length  POSTURE:  Rounded shoulder posture, normal lumbar lordosis, weight shift toward left  and patient frequently changing positions while seated for comfort  PALPATION: Exquisite tenderness to right lower lumbar paraspinals, right gluteal and TFL region, right lateral thigh and knee; moderate tenderness to left lumbar paraspinals  Unable to assess lumbar mobility  LUMBAR ROM:   AROM eval  Flexion   Extension   Right lateral flexion   Left lateral flexion   Right rotation   Left rotation    (  Blank rows = not tested)  LOWER EXTREMITY ROM:    Limitations in right hip ROM due to pain and guarding, right knee flexion limited due to pain  LOWER EXTREMITY MMT:    MMT Right eval Left eval Rt / Lt 07/08/2023  Hip flexion 3+ 4   Hip extension 2 3- 3 / 3  Hip abduction 2    Hip adduction     Hip internal rotation     Hip external rotation     Knee flexion 4- 4   Knee extension 4- 4+   Ankle dorsiflexion     Ankle plantarflexion     Ankle inversion     Ankle eversion      (Blank rows = not tested)  LUMBAR SPECIAL TESTS:  SLR positive on right  FUNCTIONAL TESTS:  Sit to stand: patient will difficulty standing from standard chair, requires UE for support with transferring from sit to stand  GAIT: Assistive device utilized: None Level of assistance: Complete Independence Comments: Antalgic on right   TREATMENT [facility] Adult PT Treatment:                                                DATE: 07/08/2023 Table inclined throughout supine/hooklying exercises for patient's breathing, she does not tolerate supine position well LAD for RLE x 5 bouts LTR x 5 each Hooklying adductor ball squeeze with abdominal set 10 x 5 sec Hooklying clamshell with yellow with abdominal set 12 x 5 sec Hooklying PPT 10 x 5 sec Hooklying abdominal set with stability ball press 10 x 5 sec Seated abdominal set with stability ball press 10 x 5 sec Seated lateral abdominal set with stability ball press 5 x 5 sec each Prone hip extension x 10 each Electrical Stimulation combined with cold  pack Location: Lumbar  Action: IFC Parameters: Standard settings, intensity to patient tolerance, 15 min Goals: Pain relief and Reduced muscle tension   PATIENT EDUCATION:  Education details: HEP provided Person educated: Patient Education method: Explanation, Demonstration, Tactile cues, Verbal cues, and Handouts Education comprehension: verbalized understanding, returned demonstration, verbal cues required, tactile cues required, and needs further education  HOME EXERCISE PROGRAM: Access Code: [email or web address]    ASSESSMENT: CLINICAL IMPRESSION: Patient tolerated therapy well with no adverse effects. Therapy focused primarily on initiating core activation and light hip strengthening with fair tolerance. She was not able to tolerate supine position so elevated the head of table and she was able to tolerate this position much better. She reported slight pain relief with LAD for the RLE. She did report that exercises targeting the right core region were much more difficulty and she felt weaker on the right side. She was able to demonstrate improve hip extension on right this visit. Provided patient with an HEP for light stretching and core exercises for home. Concluded session with e-stim IFC and cold pack for pain relief and muscle relaxation. Patient would benefit from continued skilled PT to progress mobility and strength in order to reduce pain and maximize functional ability.   Eval: Patient is a 48 y.o. female who was seen today for physical therapy evaluation and treatment for chronic lower back and right knee pain. Her symptoms do seem consistent with nonspecific lower back pain and right sided lumbar radiculopathy. Evaluation was limited this visit due to patient's high symptom severity and irritability level. She  did become tearful during the evaluation. Trial of e-stim IFC combined with cold pack with patient reporting pain level reduction, and provided patient with information for purchasing  home TENS unit for pain relief. Unable to provide patient with HEP this visit due to inability to complete exercise without severe pain. Patient was provided extensive education on possible etiologies of her symptoms and treatments that could be utilized in therapy, and gradual progression for return to activity.   OBJECTIVE IMPAIRMENTS: Abnormal gait, decreased activity tolerance, decreased balance, decreased ROM, decreased strength, impaired flexibility, postural dysfunction, and pain.   ACTIVITY LIMITATIONS: carrying, lifting, bending, sitting, standing, squatting, sleeping, stairs, transfers, bed mobility, locomotion level, and caring for others  PARTICIPATION LIMITATIONS: meal prep, cleaning, laundry, driving, shopping, and community activity  PERSONAL FACTORS: Fitness, Past/current experiences, Time since onset of injury/illness/exacerbation, and 3+ comorbidities: see PMH above  are also affecting patient's functional outcome.    GOALS: Goals reviewed with patient? Yes  SHORT TERM GOALS: Target date: 07/31/2023  Patient will be I with initial HEP in order to progress with therapy. Baseline: None provided at eval Goal status: INITIAL  2.  Patient will report low back and right leg pain </= 7/10 at worst to reduce functional limitations Baseline: 10/10 pain at worst Goal status: INITIAL  3.  Patient will be able to perform sit to stand without difficulty or using UE for support to improve mobility and indicate improvement in LE strength Baseline: requires UE for support with standing from chair Goal status: INITIAL  LONG TERM GOALS: Target date: 08/28/2023  Patient will be I with final HEP to maintain progress from PT. Baseline: None provided at eval Goal status: INITIAL  2.  Patient will report Modified Oswestry </= 25/50 (50% disability) in order to indicate an improvement in their functional status Baseline: 37/50 (74% disability) Goal status: INITIAL  3.  Patient will  demonstrate improvement in core and hip strength grossly >/= 4-/5 MMT in order to improve her activity tolerance with household tasks Baseline: see limitations above Goal status: INITIAL  4.  Patient will demonstrate right knee strength >/= 4+/5 MMT in order to improve stair negotiation and reduce fall risk due to right knee giving out Baseline: see limitations above Goal status: INITIAL  5. Patient will report lower back and right leg pain </= 4/10 in order to reduce functional limitations  Baseline: 10/10 at worst  Goal status: INITIAL   PLAN: PT FREQUENCY: 1-2x/week  PT DURATION: 8 weeks  PLANNED INTERVENTIONS: 97164- PT Re-evaluation, 97110-Therapeutic exercises, 97530- Therapeutic activity, 97112- Neuromuscular re-education, 97535- Self Care, 16109- Manual therapy, [id number]- Electrical stimulation (unattended), [phone]- Electrical stimulation (manual), Patient/Family education, Balance training, Taping, Dry Needling, Joint mobilization, Joint manipulation, Spinal manipulation, Spinal mobilization, Cryotherapy, and Moist heat.  PLAN FOR NEXT SESSION: Review HEP and progress PRN, continue with modalities for pain relief as needed, manual/TPDN for right lumbar and gluteal region, trial LAD, initiate gentle stretching and mobility for lumbar, initiate gentle core activation, hip and LE strengthening as tolerated    Rosana Hoes, PT, DPT, LAT, ATC 07/08/23  9:51 AM Phone: [phone] Fax: [phone]

## 2023-07-08 ENCOUNTER — Encounter: Payer: Self-pay | Admitting: Physical Therapy

## 2023-07-08 ENCOUNTER — Other Ambulatory Visit: Payer: Self-pay

## 2023-07-08 ENCOUNTER — Ambulatory Visit (INDEPENDENT_AMBULATORY_CARE_PROVIDER_SITE_OTHER): Admitting: Physical Therapy

## 2023-07-08 DIAGNOSIS — M79604 Pain in right leg: Secondary | ICD-10-CM

## 2023-07-08 DIAGNOSIS — M5459 Other low back pain: Secondary | ICD-10-CM | POA: Diagnosis not present

## 2023-07-08 DIAGNOSIS — M6281 Muscle weakness (generalized): Secondary | ICD-10-CM

## 2023-07-08 NOTE — Patient Instructions (Signed)
 Access Code: [email or web address] URL: [email or web address] Date: 07/08/2023 Prepared by: Rosana Hoes  Exercises - Supine Lower Trunk Rotation  - 1-2 x daily - 10 reps - Supine Hip Adduction Isometric with Ball  - 1-2 x daily - 10 reps - 3 seconds hold - Hooklying Clamshell with Resistance  - 1-2 x daily - 10 reps - 3 seconds hold - Supine Posterior Pelvic Tilt  - 1-2 x daily - 10 reps - 3 seconds hold - Prone Hip Extension with Plantarflexion  - 1-2 x daily - 10 reps

## 2023-07-09 NOTE — Therapy (Signed)
 OUTPATIENT PHYSICAL THERAPY TREATMENT   Patient Name: Erica [name] MRN: [id number] DOB:[birth year], 48 y.o., female Today's Date: 07/09/2023   END OF SESSION:     Past Medical History:  Diagnosis Date   Allergy    Anxiety    Apnea, sleep 05/04/2014   AHI 31/hr now on CPAP at 12cm H2O   Arthritis    Asthma    Asthma without status asthmaticus 01/19/2016   Axillary hidradenitis suppurativa 08/01/2011   Blood transfusion without reported diagnosis    Breast cancer (HCC) 2010   a. locally advanced left breast carcinoma diagnosed in 2010 and treated with neoadjuvant chemotherapy with docetaxel and cyclophosphamide as well as paclitaxel. This was followed by radiation therapy which was completed in 2011.   Chemotherapy-induced peripheral neuropathy (HCC) 12/09/2016   CHF (congestive heart failure) (HCC)    Chronic diastolic CHF (congestive heart failure) (HCC) 06/23/2013   Chronic kidney disease    Chronic pain in left foot 06/29/2018   Chronic stable angina (HCC) 08/22/2020   Cough variant asthma 09/27/2010   Followed in Pulmonary clinic/ [facility]/ [place]  Onset reported in childhood  - PFT's 01/31/2011 FEV1  1.76 (60% ) ratio 68 and no better p B2,  DLCO 70% -med review 09/25/2012 > [place] drug assistance  paperwork given    - HFA 90% 11/20/2012 .  -Med calendar 12/07/12 , 04/06/2013 , 04/18/2014,, 08/30/2014 , 11/08/2014  - PFT's 01/12/2013  1.73 (65%)  Ratio 73 and no better p B2, DLCO 86% - trial off    Depression with somatization 06/15/2015   Dyspnea on exertion    a. 01/2013 Lexi MV: EF 54%, no ischemia/infarct;  b. 05/2013 Echo: EF 60-65%, no rwma, Gr 2 DD;  b.    Essential hypertension 10/09/2014   Estimated Creatinine Clearance: 82.4 mL/min (by C-G formula based on SCr of 0.98 mg/dL).   Fatty liver    Gastroparesis    GERD (gastroesophageal reflux disease)    Hyperglycemia 04/13/2011   Hypertension    Hypertensive heart disease 09/26/2015   Hypertriglyceridemia without  hypercholesterolemia 06/15/2015   Framingham risk score is 1%    Idiopathic intracranial hypertension 10/21/2014   Impaired glucose tolerance 04/13/2011   Insomnia secondary to anxiety 04/05/2015   Lymphadenitis, chronic    restricted LEFT extremity   Malignant neoplasm of upper-outer quadrant of left breast in female, estrogen receptor negative (HCC) 01/07/2013   Stage III, Invasive Ductal, s/p partial Left Mastectomy/Lumpectomy (baseball sized) with Lymph Node dissection, had Chemo and RadRx     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   OSA (obstructive sleep apnea) 05/04/2014   AHI 31/hr now on CPAP at 12cm H2O   Oxygen deficiency    Uses 02 in CPAP   Persistent proteinuria 03/31/2018   Personal history of chemotherapy 2010   Personal history of radiation therapy 2010   Resistant hypertension 09/17/2011       Sleep apnea    SVT (supraventricular tachycardia) (HCC) 08/22/2020   Tibialis posterior tendinitis, left 07/15/2018   Injected in March 10, 2019   Ulcer    Upper airway cough syndrome 10/09/2014   Onset around 2012 with assoc pseudowheeze MRI 05/18/15 > Paranasal sinuses are clear - 11/17/2015 rec increase h1 to 2 hs to help with noct cough  - Allergy profile 11/17/2015 >  Eos 0.1 /  IgE  80 POS RAST  to grass, ragweed, cedar tree  - d/c acei 03/06/2018  - flare end of Jan 2020 in setting of ?  Uri/ poorly controlled GERD - flare mid Oct 2020 with uri / assoc secondary gerd   Vitamin D deficiency disease 03/31/2018   Past Surgical History:  Procedure Laterality Date   BRAIN SURGERY     Shunt   BREAST LUMPECTOMY Left 09/2008   CARPAL TUNNEL RELEASE Left 2011   LYMPH NODE DISSECTION Left 2010   breast; 2 wks after breast lumpectomy   RENAL DENERVATION N/A 11/20/2022   Procedure: RENAL DENERVATION;  Surgeon: Iran Ouch, MD;  Location: [facility] INVASIVE CV LAB;  Service: Cardiovascular;  Laterality: N/A;   RIGHT/LEFT HEART CATH AND CORONARY ANGIOGRAPHY N/A 05/04/2019   Procedure:  RIGHT/LEFT HEART CATH AND CORONARY ANGIOGRAPHY;  Surgeon: Dolores Patty, MD;  Location: [facility] INVASIVE CV LAB;  Service: Cardiovascular;  Laterality: N/A;  Renal injection preformed    RIGHT/LEFT HEART CATH AND CORONARY ANGIOGRAPHY N/A 10/25/2020   Procedure: RIGHT/LEFT HEART CATH AND CORONARY ANGIOGRAPHY;  Surgeon: Marykay Lex, MD;  Location: [facility] INVASIVE CV LAB;  Service: Cardiovascular;  Laterality: N/A;   TENDON RELEASE Right 2012   "hand"   TUBAL LIGATION  05/11/2011   Procedure: POST PARTUM TUBAL LIGATION;  Surgeon: Scheryl Darter, MD;  Location: [facility];  Service: Gynecology;  Laterality: Bilateral;  Induced for HTN   Patient Active Problem List   Diagnosis Date Noted   Prediabetes 06/13/2023   Moderate episode of recurrent major depressive disorder (HCC) 02/04/2023   GAD (generalized anxiety disorder) 02/04/2023   Chronic respiratory failure with hypoxia (HCC) 12/31/2021   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   Coronary artery disease    Chronic stable angina (HCC) 08/22/2020   SVT (supraventricular tachycardia) (HCC) 08/22/2020   Palpitations 02/28/2020   Right ankle pain 04/14/2019   OSA (obstructive sleep apnea) 10/01/2018   Tibialis posterior tendinitis, left 07/15/2018   Chronic pain in left foot 06/29/2018   Vitamin D deficiency disease 03/31/2018   Persistent proteinuria 03/31/2018   Hyperlipidemia LDL goal <100 03/30/2018   Chemotherapy-induced peripheral neuropathy (HCC) 12/09/2016   Asthma in adult, mild persistent, uncomplicated 01/19/2016   Hypertensive heart disease 09/26/2015   Depression with somatization 06/15/2015   Hypertriglyceridemia without hypercholesterolemia 06/15/2015   Hypokalemia 06/15/2015   Insomnia secondary to anxiety 04/05/2015   Idiopathic intracranial hypertension 10/21/2014   Upper airway cough syndrome 10/09/2014   Malignant hypertension 10/09/2014   Morbid obesity due to excess calories (HCC) 10/09/2014   Apnea, sleep 05/04/2014   Acute on  chronic diastolic CHF (congestive heart failure), NYHA class 2 (HCC) 05/03/2014   DOE (dyspnea on exertion) 07/14/2013   Acute on chronic diastolic heart failure (HCC) 06/23/2013   Malignant neoplasm of upper-outer quadrant of left breast in female, estrogen receptor negative (HCC) 01/07/2013   Gastroparesis 01/04/2013   Resistant hypertension 09/17/2011   Axillary hidradenitis suppurativa 08/01/2011   Preventative health care 04/13/2011   Hyperglycemia 04/13/2011   GERD (gastroesophageal reflux disease) 03/18/2011   Cough variant asthma 09/27/2010    PCP: Hilbert Bible, FNP  REFERRING PROVIDER: Rodolph Bong, MD  REFERRING DIAG: Lumbar radiculopathy  Rationale for Evaluation and Treatment: Rehabilitation  THERAPY DIAG:  No diagnosis found.  ONSET DATE: August 2024   SUBJECTIVE:     SUBJECTIVE STATEMENT: Patient reports she is doing ok. She did have to stay in the bed this past Saturday but was able to get out and do some things around the house on Sunday.    Eval: Patients reports she had renal denervation done in August 2024 and has been  in pain ever since. Pain is located across the lower back, she was told it would hurt for 3-4 months but pain persisted in the lower back. The right knee pain started after in October or November, and it will keep her up at night. She states it feels heavy in her lower back, like labor pain that never stops. The pain will start in the right lower back region and then will feel like lightning shooting down the right leg to the back and outside of the right knee, pain does not go past the knee. She leans on the bed for relief, and she states getting on her stomach gives her the most relief, but she has a lot of pain when getting up. She does use a heating pad as well. She reports that to help with the knee pain at night she has to have her leg up on the wall. She states she moves around a lot because sitting or standing for long periods will  make it worse. Will occasionally have trouble bending down and she states the right leg has given out on her causing her to fall. She does have trouble going up/down stairs, she turns to the side when going down, and she also has breathing issues so when she goes up stairs to get inside she has to immediately get on oxygen. She reports inability to play with or lift her grandchildren and difficulty completing activities around her home for her son.  PERTINENT HISTORY:  See PMH above  PAIN:  Are you having pain? Yes:  NPRS scale: 6/10 currently, 10/10 at worst Pain location: Lower back and right leg/knee Pain description: Heavy, labor pains, constant Aggravating factors: Standing, sitting, activity Relieving factors: Lying on stomach, leaning on bed or counter, heat  PRECAUTIONS: Fall, patient reports she was told not to lift more than 10 lbs  FALLS:  Has patient fallen in last 6 months? Yes. Number of falls 3-4, right knee gives out or sharp pains could cause her to fall  PATIENT GOALS: Pain relief, get back to doing normal things   OBJECTIVE:  Note: Objective measures were completed at Evaluation unless otherwise noted. PATIENT SURVEYS:  Modified Oswestry 37/50 (74% disability)   SENSATION: Patient reports neuropathy of feet and hands  MUSCLE LENGTH: Unable to fully assess, severe increase in right lower back and leg pain when attempting to assess hamstring length  POSTURE:  Rounded shoulder posture, normal lumbar lordosis, weight shift toward left and patient frequently changing positions while seated for comfort  PALPATION: Exquisite tenderness to right lower lumbar paraspinals, right gluteal and TFL region, right lateral thigh and knee; moderate tenderness to left lumbar paraspinals  Unable to assess lumbar mobility  LUMBAR ROM:   AROM eval  Flexion   Extension   Right lateral flexion   Left lateral flexion   Right rotation   Left rotation    (Blank rows = not  tested)  LOWER EXTREMITY ROM:    Limitations in right hip ROM due to pain and guarding, right knee flexion limited due to pain  LOWER EXTREMITY MMT:    MMT Right eval Left eval Rt / Lt 07/08/2023  Hip flexion 3+ 4   Hip extension 2 3- 3 / 3  Hip abduction 2    Hip adduction     Hip internal rotation     Hip external rotation     Knee flexion 4- 4   Knee extension 4- 4+   Ankle dorsiflexion  Ankle plantarflexion     Ankle inversion     Ankle eversion      (Blank rows = not tested)  LUMBAR SPECIAL TESTS:  SLR positive on right  FUNCTIONAL TESTS:  Sit to stand: patient will difficulty standing from standard chair, requires UE for support with transferring from sit to stand  GAIT: Assistive device utilized: None Level of assistance: Complete Independence Comments: Antalgic on right   TREATMENT [facility] Adult PT Treatment:                                                DATE: 07/10/2023 Table inclined throughout supine/hooklying exercises for patient's breathing, she does not tolerate supine position well LAD for RLE x 5 bouts LTR x 5 each Hooklying adductor ball squeeze with abdominal set 10 x 5 sec Hooklying clamshell with yellow with abdominal set 12 x 5 sec Hooklying PPT 10 x 5 sec Hooklying abdominal set with stability ball press 10 x 5 sec Seated abdominal set with stability ball press 10 x 5 sec Seated lateral abdominal set with stability ball press 5 x 5 sec each Prone hip extension x 10 each Electrical Stimulation combined with cold pack Location: Lumbar  Action: IFC Parameters: Standard settings, intensity to patient tolerance, 15 min Goals: Pain relief and Reduced muscle tension   PATIENT EDUCATION:  Education details: HEP provided Person educated: Patient Education method: Explanation, Demonstration, Tactile cues, Verbal cues, and Handouts Education comprehension: verbalized understanding, returned demonstration, verbal cues required, tactile cues  required, and needs further education  HOME EXERCISE PROGRAM: Access Code: [email or web address]    ASSESSMENT: CLINICAL IMPRESSION: Patient tolerated therapy well with no adverse effects. *** Patient would benefit from continued skilled PT to progress mobility and strength in order to reduce pain and maximize functional ability.  Therapy focused primarily on initiating core activation and light hip strengthening with fair tolerance. She was not able to tolerate supine position so elevated the head of table and she was able to tolerate this position much better. She reported slight pain relief with LAD for the RLE. She did report that exercises targeting the right core region were much more difficulty and she felt weaker on the right side. She was able to demonstrate improve hip extension on right this visit. Provided patient with an HEP for light stretching and core exercises for home. Concluded session with e-stim IFC and cold pack for pain relief and muscle relaxation.    Eval: Patient is a 48 y.o. female who was seen today for physical therapy evaluation and treatment for chronic lower back and right knee pain. Her symptoms do seem consistent with nonspecific lower back pain and right sided lumbar radiculopathy. Evaluation was limited this visit due to patient's high symptom severity and irritability level. She did become tearful during the evaluation. Trial of e-stim IFC combined with cold pack with patient reporting pain level reduction, and provided patient with information for purchasing home TENS unit for pain relief. Unable to provide patient with HEP this visit due to inability to complete exercise without severe pain. Patient was provided extensive education on possible etiologies of her symptoms and treatments that could be utilized in therapy, and gradual progression for return to activity.   OBJECTIVE IMPAIRMENTS: Abnormal gait, decreased activity tolerance, decreased balance, decreased ROM,  decreased strength, impaired flexibility, postural dysfunction, and pain.   ACTIVITY  LIMITATIONS: carrying, lifting, bending, sitting, standing, squatting, sleeping, stairs, transfers, bed mobility, locomotion level, and caring for others  PARTICIPATION LIMITATIONS: meal prep, cleaning, laundry, driving, shopping, and community activity  PERSONAL FACTORS: Fitness, Past/current experiences, Time since onset of injury/illness/exacerbation, and 3+ comorbidities: see PMH above  are also affecting patient's functional outcome.    GOALS: Goals reviewed with patient? Yes  SHORT TERM GOALS: Target date: 07/31/2023  Patient will be I with initial HEP in order to progress with therapy. Baseline: None provided at eval Goal status: INITIAL  2.  Patient will report low back and right leg pain </= 7/10 at worst to reduce functional limitations Baseline: 10/10 pain at worst Goal status: INITIAL  3.  Patient will be able to perform sit to stand without difficulty or using UE for support to improve mobility and indicate improvement in LE strength Baseline: requires UE for support with standing from chair Goal status: INITIAL  LONG TERM GOALS: Target date: 08/28/2023  Patient will be I with final HEP to maintain progress from PT. Baseline: None provided at eval Goal status: INITIAL  2.  Patient will report Modified Oswestry </= 25/50 (50% disability) in order to indicate an improvement in their functional status Baseline: 37/50 (74% disability) Goal status: INITIAL  3.  Patient will demonstrate improvement in core and hip strength grossly >/= 4-/5 MMT in order to improve her activity tolerance with household tasks Baseline: see limitations above Goal status: INITIAL  4.  Patient will demonstrate right knee strength >/= 4+/5 MMT in order to improve stair negotiation and reduce fall risk due to right knee giving out Baseline: see limitations above Goal status: INITIAL  5. Patient will report  lower back and right leg pain </= 4/10 in order to reduce functional limitations  Baseline: 10/10 at worst  Goal status: INITIAL   PLAN: PT FREQUENCY: 1-2x/week  PT DURATION: 8 weeks  PLANNED INTERVENTIONS: 97164- PT Re-evaluation, 97110-Therapeutic exercises, 97530- Therapeutic activity, 97112- Neuromuscular re-education, 97535- Self Care, 84132- Manual therapy, [id number]- Electrical stimulation (unattended), [phone]- Electrical stimulation (manual), Patient/Family education, Balance training, Taping, Dry Needling, Joint mobilization, Joint manipulation, Spinal manipulation, Spinal mobilization, Cryotherapy, and Moist heat.  PLAN FOR NEXT SESSION: Review HEP and progress PRN, continue with modalities for pain relief as needed, manual/TPDN for right lumbar and gluteal region, trial LAD, initiate gentle stretching and mobility for lumbar, initiate gentle core activation, hip and LE strengthening as tolerated    Rosana Hoes, PT, DPT, LAT, ATC 07/09/23  4:23 PM Phone: [phone] Fax: [phone]

## 2023-07-10 ENCOUNTER — Encounter: Payer: Self-pay | Admitting: Physical Therapy

## 2023-07-10 ENCOUNTER — Ambulatory Visit (INDEPENDENT_AMBULATORY_CARE_PROVIDER_SITE_OTHER): Admitting: Physical Therapy

## 2023-07-10 ENCOUNTER — Other Ambulatory Visit: Payer: Self-pay

## 2023-07-10 DIAGNOSIS — M6281 Muscle weakness (generalized): Secondary | ICD-10-CM

## 2023-07-10 DIAGNOSIS — M79604 Pain in right leg: Secondary | ICD-10-CM | POA: Diagnosis not present

## 2023-07-10 DIAGNOSIS — M5459 Other low back pain: Secondary | ICD-10-CM | POA: Diagnosis not present

## 2023-07-10 NOTE — Patient Instructions (Signed)
 Access Code: [email or web address] URL: [email or web address] Date: 07/10/2023 Prepared by: Rosana Hoes  Exercises - Supine Lower Trunk Rotation  - 1-2 x daily - 10 reps - Supine Hip Adduction Isometric with Ball  - 1-2 x daily - 10 reps - 3 seconds hold - Hooklying Clamshell with Resistance  - 1-2 x daily - 10 reps - 3 seconds hold - Supine Posterior Pelvic Tilt  - 1-2 x daily - 10 reps - 3 seconds hold - Straight Leg Raise  - 1-2 x daily - 10 reps - Prone Hip Extension with Plantarflexion  - 1-2 x daily - 10 reps

## 2023-07-14 ENCOUNTER — Ambulatory Visit (HOSPITAL_BASED_OUTPATIENT_CLINIC_OR_DEPARTMENT_OTHER): Payer: Medicare Other | Admitting: Family

## 2023-07-14 VITALS — BP 180/88 | HR 92 | Ht 66.0 in | Wt 218.0 lb

## 2023-07-14 DIAGNOSIS — I739 Peripheral vascular disease, unspecified: Secondary | ICD-10-CM

## 2023-07-14 DIAGNOSIS — G4733 Obstructive sleep apnea (adult) (pediatric): Secondary | ICD-10-CM | POA: Diagnosis not present

## 2023-07-14 DIAGNOSIS — I471 Supraventricular tachycardia, unspecified: Secondary | ICD-10-CM

## 2023-07-14 DIAGNOSIS — I1A0 Resistant hypertension: Secondary | ICD-10-CM | POA: Diagnosis not present

## 2023-07-14 DIAGNOSIS — G932 Benign intracranial hypertension: Secondary | ICD-10-CM

## 2023-07-14 DIAGNOSIS — I10 Essential (primary) hypertension: Secondary | ICD-10-CM

## 2023-07-14 DIAGNOSIS — E785 Hyperlipidemia, unspecified: Secondary | ICD-10-CM

## 2023-07-14 DIAGNOSIS — I5032 Chronic diastolic (congestive) heart failure: Secondary | ICD-10-CM

## 2023-07-14 DIAGNOSIS — Z006 Encounter for examination for normal comparison and control in clinical research program: Secondary | ICD-10-CM

## 2023-07-14 DIAGNOSIS — Z6835 Body mass index (BMI) 35.0-35.9, adult: Secondary | ICD-10-CM

## 2023-07-14 MED ORDER — HYDRALAZINE HCL 100 MG PO TABS: 100.0000 mg | ORAL_TABLET | Freq: Three times a day (TID) | ORAL | 3 refills | Status: AC

## 2023-07-14 MED ORDER — CLONIDINE 0.3 MG/24HR TD PTWK: MEDICATED_PATCH | TRANSDERMAL | 2 refills | Status: DC

## 2023-07-14 NOTE — Progress Notes (Unsigned)
 Cardiology Office Note:  .   Date:  07/15/2023  ID:  Erica [name], DOB Dec 28, 1975, MRN [id number] PCP: Hilbert Bible, FNP  [facility] Providers Cardiologist:  Chilton Si, MD    History of Present Illness: .   Erica [name] is a 48 y.o. female with a hx of resistant hypertension, chronic diastolic heart failure, breast cancer s/p chemo and XRT, morbid obesity, OSA on BIPAP, renal artery stenosis, CAD, HLD.   She has history of hypertension dating back to treatment of breast cancer in 2010. She has established with Dr. Duke Salvia in the hypertension clinic. Previous echocardiogram 03/2018 LVEF 45-50%, grade 2 diastolic dysfunction, mild MR, PASP 33. Renal artery duppler 2019 with bilateral 1-59% stenosis. CT-A abdomen with 1.3 cm renal cyst but no adrenal adenomas. No evidence of renal artery stenosis.    She has a hx of recurrent syncope. 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 She has sleep apnea and uses BIPAP. She had LHC 05/2019 with finding of 60% distal LCx and 80% distal LAD disease. She was started on Clopidogrel due to allergy to aspirin. LE duplex 10/2019 for lower extremity edema was negative for DVT.    Blood pressure has been difficult to control. Doxazosin ordered at initial clinic visit. Amlodipine subsequently added. She has been given Metolazone in addition to Torsemide due to edema.  Her hydralazine was reduced to 50mg  TID and doxazosin increased to 8mg  twice daily to try to reduce TID dosing. Ranexa increased due to chest pain with improvement. Clonidine increased to 0.6mg  patch. Monitor 03/23/20  showing PVC with up to 8 beats of SVT.     R/LHC 10/25/20 with distal LAD 80% stenosed, LPAV lesion 30% stenosed with 50% stenosed side branch in first LPL. Hyperdynamic LV systolic function was noted. Hemodynamic findings were consistent with mild secondary pulmonary hypertension. Overall unchanged from previous. Angina  was suspected due to accelerated hypertension, increased wall stress, and microvascular ischemia. Distal LAD not favorable for PCI but could be considered for intractable pain. BP markedly elevated 200s/110s which improved after medication to 152/78.   Echo 04/05/21 normal LVEF 55-60%, mild LVH, normal diastolic parameters, mildly elevated PASP, bilateral atria moderately dilated, mild MR. Based on follow up labs her Metolazone was reduced from 5mg  to 2.5mg  weekly.  Minoxidil was increased to 5 mg twice daily.   At follow up 05/2021 BP 150s/70s at home. She was not taking Doxazosin nor Minoxidil and these were resumed. At follow up 07/16/21, HCTZ transitioned to Chlorthalidone. Doxasozin dose increased.  She wore a 24h BP monitor with average daytime BP 135/68 and during white coat period 182/96. Average overall  BP on monitor 127/65. Suspected inaccurate as lower than her home readings.    At visit 10/02/22 Diltiazem increased to 300mg  every day due to palpitations. Updated renal duplex with minimal stenosis. She had renal denervation 11/20/2022. Seen 11/28/22 with BP at home 140-150s.   At last visit 01/09/2023 due to palpitations diltiazem increased to 360 mg daily.  14-day monitor with predominant normal sinus rhythm, 6 episodes of NSVT up to 16 beats NSVT up to 14 beats. ABI 01/31/23 with right ABI normal, left ABI noncompressible left lower extremity arteries. Reginal Lutes denied for PAD and obesity by her insurance plan.  At visit 04/14/23 Torsemide increased to 40mg  AM and 20mg  PM. Zetia discontinued for simplification of regimen.   Presents today for follow up. SBP at home 140-150. Persistent right knee  pain despite recent injection with sports medicine. Participating in PT for her back. 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   ROS: Please see the history of present illness.    All other systems reviewed  and are negative.   Studies Reviewed: .           Risk Assessment/Calculations:            Physical Exam:   VS:  BP (!) 180/88 (BP Location: Right Arm, Patient Position: Sitting, Cuff Size: Large)   Pulse 92   Ht 5\' 6"  (1.676 m)   Wt 218 lb (98.9 kg)   LMP  (LMP Unknown)   SpO2 95%   BMI 35.19 kg/m    Wt Readings from Last 3 Encounters:  07/14/23 218 lb (98.9 kg)  07/01/23 219 lb (99.3 kg)  06/13/23 217 lb 9.6 oz (98.7 kg)    Vitals:   07/14/23 0812  BP: (!) 180/88  Pulse: 92  Height: 5\' 6"  (1.676 m)  Weight: 218 lb (98.9 kg)  SpO2: 95%  BMI (Calculated): 35.2     GEN: Well nourished, well developed in no acute distress NECK: No JVD; No carotid bruits CARDIAC: RRR, no murmurs, rubs, gallops RESPIRATORY:  Clear to auscultation without rales, wheezing or rhonchi  ABDOMEN: Soft, non-tender, non-distended EXTREMITIES:  No edema; No deformity   ASSESSMENT AND PLAN: .    Malignant hypertension - s/p renal denervation 11/20/22. Current regimen Clonidine 0.6mg  patch, Amlodipine 10mg  QD, Doxazosin 16mg  QHS, Diltiazem 360mg  daily, torsemide 40 mg a.m. and 20 mg p.m.,, Chlorthalidone 25mg  QD, Torsemide 20mg  BID, Nebivolol 40mg  QD, Ranexa 1000mg  BID, Spironolactone 100mg  every day.  Increase hydralazine from 50 mg 3 times daily to 100 mg 3 times daily.   Prior secondary workup has been unremarkable with exception of sleep apnea which is treated with BIPAP Unclear how to further optimize her BP control. She has picked up all of her prescriptions w/in appropriate timeframe by pharmacy review. Reports taking as prescribed. Will have her follow up with Dr. Duke Salvia for further recommendations.    PAD / Renal artery stenosis - Renal duplex 09/2018 bilateral 1-59% stenosis with CT angio abdomen 09/22/19 with no RAS. Renal duplex 01/2023 L renal artery 1-59% stenosed, no right RAS.  ABI 12/2022 right ABI 1.29, left ABI 1.35 with normal right ABI and left with noncompressible left lower  extremity arteries.  Insurance did not approve Wegovy for PAD.  Continue Repatha, Rosuvastatin.   Chronic stable angina / CAD - Intolerant of Imdur due to headache. She is on maximum dose Ranexa. GDMT includes Plavix 75 mg daily (no Aspirin due to allergy), Nebivolol 40 mg daily, Rosuvastatin 40 mg daily, Repatha. R/LHC 09/2020 with stable single vessel CAD with apical 80% stenosis unchanged with torturous coronary arteries. Distal LAD lesion not favorable for PCI. No symptoms concerning for worsening angina.   Hypertriglyceridemia / HLD, LDL goal <70 - Continue Repatha, Rosuvastatin 40mg  QD. 09/2022 LDL 26.  Was recommended to discontinue Zetia last visit but she has still been taking.  Will ask nursing team to call the pharmacy to discontinue now and also inform Miss Strack.   OSA - BIPAP compliance encouraged. She follows with Dr. Mayford Knife.   Chronic diastolic heart failure -  Continue current dose of  metolazone 2.5 mg weekly, Entresto 97-103 mg twice daily,, nebivolol 40 mg daily, torsemide 40 mg a.m. and 20 mg p.m.  Stable renal function despite multiple diuretics.    Asthma - Follows with pulmonology Dr. Waymond Hailey.  No signs of acute exacerbation   SVT -monitor 12/2022 predominately normal sinus rhythm with short episodes of SVT and NSVT.  Continue diltiazem 360 mg daily, nebivolol 40 mg daily.  Encouraged to avoid caffeine, manage stress well, stay hydrated.   BMI 35 - Weight loss via diet and exercise encouraged.  Exercise limited by her present orthopedic issues.  Working with PT for back pain as well as knee pain.  Frederik Jansky previously not approved.  S/p VP shunt - Inserted 2017. Valve type: Hakim Inline Siphonguard Unitzed by [employer] Lot number: [id number] Cath: Ventricular standard extra-large. Implanted: [employer]: Lot number [id number] right-sided     Dispo: follow up in 3-4 months  Signed, Clearnce Curia, NP

## 2023-07-14 NOTE — Patient Instructions (Signed)
 Medication Instructions:  Your physician has recommended you make the following change in your medication:  Increase Hydralazine 100mg  three times daily   Follow-Up: At [facility], you and your health needs are our priority.  As part of our continuing mission to provide you with exceptional heart care, our providers are all part of one team.  This team includes your primary Cardiologist (physician) and Advanced Practice Providers or APPs (Physician Assistants and Nurse Practitioners) who all work together to provide you with the care you need, when you need it.  2 months in [facility] with Dr. Theodis Fiscal

## 2023-07-15 ENCOUNTER — Encounter (HOSPITAL_BASED_OUTPATIENT_CLINIC_OR_DEPARTMENT_OTHER): Payer: Self-pay | Admitting: Family

## 2023-07-15 ENCOUNTER — Encounter: Payer: Self-pay | Admitting: Physical Therapy

## 2023-07-15 ENCOUNTER — Ambulatory Visit: Admitting: Physical Therapy

## 2023-07-15 ENCOUNTER — Other Ambulatory Visit: Payer: Self-pay

## 2023-07-15 DIAGNOSIS — M5459 Other low back pain: Secondary | ICD-10-CM

## 2023-07-15 DIAGNOSIS — M79604 Pain in right leg: Secondary | ICD-10-CM | POA: Diagnosis not present

## 2023-07-15 DIAGNOSIS — M6281 Muscle weakness (generalized): Secondary | ICD-10-CM

## 2023-07-15 NOTE — Therapy (Signed)
 OUTPATIENT PHYSICAL THERAPY TREATMENT   Patient Name: Erica [name] MRN: [id number] DOB:October 13, 1975, 48 y.o., female Today's Date: 07/15/2023   END OF SESSION:  PT End of Session - 07/15/23 0919     Visit Number 4    Number of Visits 17    Date for PT Re-Evaluation 08/28/23    Authorization Type [employer] MCR    Progress Note Due on Visit 10    PT Start Time [phone]    PT Stop Time 0930    PT Time Calculation (min) 43 min    Activity Tolerance Patient limited by pain    Behavior During Therapy [facility] for tasks assessed/performed                Past Medical History:  Diagnosis Date   Allergy    Anxiety    Apnea, sleep 05/04/2014   AHI 31/hr now on CPAP at 12cm H2O   Arthritis    Asthma    Asthma without status asthmaticus 01/19/2016   Axillary hidradenitis suppurativa 08/01/2011   Blood transfusion without reported diagnosis    Breast cancer (HCC) 2010   a. locally advanced left breast carcinoma diagnosed in 2010 and treated with neoadjuvant chemotherapy with docetaxel and cyclophosphamide as well as paclitaxel. This was followed by radiation therapy which was completed in 2011.   Chemotherapy-induced peripheral neuropathy (HCC) 12/09/2016   CHF (congestive heart failure) (HCC)    Chronic diastolic CHF (congestive heart failure) (HCC) 06/23/2013   Chronic kidney disease    Chronic pain in left foot 06/29/2018   Chronic stable angina (HCC) 08/22/2020   Cough variant asthma 09/27/2010   Followed in Pulmonary clinic/ [facility]/ [place]  Onset reported in childhood  - PFT's 01/31/2011 FEV1  1.76 (60% ) ratio 68 and no better p B2,  DLCO 70% -med review 09/25/2012 > [place] drug assistance  paperwork given    - HFA 90% 11/20/2012 .  -Med calendar 12/07/12 , 04/06/2013 , 04/18/2014,, 08/30/2014 , 11/08/2014  - PFT's 01/12/2013  1.73 (65%)  Ratio 73 and no better p B2, DLCO 86% - trial off    Depression with somatization 06/15/2015   Dyspnea on exertion    a. 01/2013 Lexi MV: EF 54%, no  ischemia/infarct;  b. 05/2013 Echo: EF 60-65%, no rwma, Gr 2 DD;  b.    Essential hypertension 10/09/2014   Estimated Creatinine Clearance: 82.4 mL/min (by C-G formula based on SCr of 0.98 mg/dL).   Fatty liver    Gastroparesis    GERD (gastroesophageal reflux disease)    Hyperglycemia 04/13/2011   Hypertension    Hypertensive heart disease 09/26/2015   Hypertriglyceridemia without hypercholesterolemia 06/15/2015   Framingham risk score is 1%    Idiopathic intracranial hypertension 10/21/2014   Impaired glucose tolerance 04/13/2011   Insomnia secondary to anxiety 04/05/2015   Lymphadenitis, chronic    restricted LEFT extremity   Malignant neoplasm of upper-outer quadrant of left breast in female, estrogen receptor negative (HCC) 01/07/2013   Stage III, Invasive Ductal, s/p partial Left Mastectomy/Lumpectomy (baseball sized) with Lymph Node dissection, had Chemo and RadRx     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   OSA (obstructive sleep apnea) 05/04/2014   AHI 31/hr now on CPAP at 12cm H2O   Oxygen deficiency    Uses 02 in CPAP   Persistent proteinuria 03/31/2018   Personal history of chemotherapy 2010   Personal history of radiation therapy 2010   Resistant hypertension 09/17/2011       Sleep  apnea    SVT (supraventricular tachycardia) (HCC) 08/22/2020   Tibialis posterior tendinitis, left 07/15/2018   Injected in March 10, 2019   Ulcer    Upper airway cough syndrome 10/09/2014   Onset around 2012 with assoc pseudowheeze MRI 05/18/15 > Paranasal sinuses are clear - 11/17/2015 rec increase h1 to 2 hs to help with noct cough  - Allergy profile 11/17/2015 >  Eos 0.1 /  IgE  80 POS RAST  to grass, ragweed, cedar tree  - d/c acei 03/06/2018  - flare end of Jan 2020 in setting of ? Uri/ poorly controlled GERD - flare mid Oct 2020 with uri / assoc secondary gerd   Vitamin D deficiency disease 03/31/2018   Past Surgical History:  Procedure Laterality Date   BRAIN SURGERY     Shunt    BREAST LUMPECTOMY Left 09/2008   CARPAL TUNNEL RELEASE Left 2011   LYMPH NODE DISSECTION Left 2010   breast; 2 wks after breast lumpectomy   RENAL DENERVATION N/A 11/20/2022   Procedure: RENAL DENERVATION;  Surgeon: Wenona Hamilton, MD;  Location: [facility] INVASIVE CV LAB;  Service: Cardiovascular;  Laterality: N/A;   RIGHT/LEFT HEART CATH AND CORONARY ANGIOGRAPHY N/A 05/04/2019   Procedure: RIGHT/LEFT HEART CATH AND CORONARY ANGIOGRAPHY;  Surgeon: Mardell Shade, MD;  Location: [facility] INVASIVE CV LAB;  Service: Cardiovascular;  Laterality: N/A;  Renal injection preformed    RIGHT/LEFT HEART CATH AND CORONARY ANGIOGRAPHY N/A 10/25/2020   Procedure: RIGHT/LEFT HEART CATH AND CORONARY ANGIOGRAPHY;  Surgeon: Arleen Lacer, MD;  [facility] INVASIVE CV LAB;  Service: Cardiovascular;  Laterality: N/A;   TENDON RELEASE Right 2012   "hand"   TUBAL LIGATION  05/11/2011   Procedure: POST PARTUM TUBAL LIGATION;  Surgeon: Onnie Bilis, MD;  Location: [facility];  Service: Gynecology;  Laterality: Bilateral;  Induced for HTN   Patient Active Problem List   Diagnosis Date Noted   Prediabetes 06/13/2023   Moderate episode of recurrent major depressive disorder (HCC) 02/04/2023   GAD (generalized anxiety disorder) 02/04/2023   Chronic respiratory failure with hypoxia (HCC) 12/31/2021   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   Coronary artery disease    Chronic stable angina (HCC) 08/22/2020   SVT (supraventricular tachycardia) (HCC) 08/22/2020   Palpitations 02/28/2020   Right ankle pain 04/14/2019   OSA (obstructive sleep apnea) 10/01/2018   Tibialis posterior tendinitis, left 07/15/2018   Chronic pain in left foot 06/29/2018   Vitamin D deficiency disease 03/31/2018   Persistent proteinuria 03/31/2018   Hyperlipidemia LDL goal <100 03/30/2018   Chemotherapy-induced peripheral neuropathy (HCC) 12/09/2016   Asthma in adult, mild persistent, uncomplicated 01/19/2016   Hypertensive heart disease 09/26/2015   Depression  with somatization 06/15/2015   Hypertriglyceridemia without hypercholesterolemia 06/15/2015   Hypokalemia 06/15/2015   Insomnia secondary to anxiety 04/05/2015   Idiopathic intracranial hypertension 10/21/2014   Upper airway cough syndrome 10/09/2014   Malignant hypertension 10/09/2014   Morbid obesity due to excess calories (HCC) 10/09/2014   Apnea, sleep 05/04/2014   Acute on chronic diastolic CHF (congestive heart failure), NYHA class 2 (HCC) 05/03/2014   DOE (dyspnea on exertion) 07/14/2013   Acute on chronic diastolic heart failure (HCC) 06/23/2013   Malignant neoplasm of upper-outer quadrant of left breast in female, estrogen receptor negative (HCC) 01/07/2013   Gastroparesis 01/04/2013   Resistant hypertension 09/17/2011   Axillary hidradenitis suppurativa 08/01/2011   Preventative health care 04/13/2011   Hyperglycemia 04/13/2011   GERD (gastroesophageal reflux disease) 03/18/2011   Cough  variant asthma 09/27/2010    PCP: Nonda Bays, FNP  REFERRING PROVIDER: Syliva Even, MD  REFERRING DIAG: Lumbar radiculopathy  Rationale for Evaluation and Treatment: Rehabilitation  THERAPY DIAG:  Other low back pain  Pain in right leg  Muscle weakness (generalized)  ONSET DATE: August 2024   SUBJECTIVE:     SUBJECTIVE STATEMENT: Patient reports her back was hurting really bad yesterday for no apparent reason and today she is still recovering from that. Pain today is mainly in the lower back. She also reports she fell, she doesn't know what happened. States she was up and then all of a sudden went down.    Eval: Patients reports she had renal denervation done in August 2024 and has been in pain ever since. Pain is located across the lower back, she was told it would hurt for 3-4 months but pain persisted in the lower back. The right knee pain started after in October or November, and it will keep her up at night. She states it feels heavy in her lower back, like  labor pain that never stops. The pain will start in the right lower back region and then will feel like lightning shooting down the right leg to the back and outside of the right knee, pain does not go past the knee. She leans on the bed for relief, and she states getting on her stomach gives her the most relief, but she has a lot of pain when getting up. She does use a heating pad as well. She reports that to help with the knee pain at night she has to have her leg up on the wall. She states she moves around a lot because sitting or standing for long periods will make it worse. Will occasionally have trouble bending down and she states the right leg has given out on her causing her to fall. She does have trouble going up/down stairs, she turns to the side when going down, and she also has breathing issues so when she goes up stairs to get inside she has to immediately get on oxygen. She reports inability to play with or lift her grandchildren and difficulty completing activities around her home for her son.  PERTINENT HISTORY:  See PMH above  PAIN:  Are you having pain? Yes:  NPRS scale: 8/10 currently, 10/10 at worst Pain location: Lower back and right leg/knee Pain description: Heavy, labor pains, constant Aggravating factors: Standing, sitting, activity Relieving factors: Lying on stomach, leaning on bed or counter, heat  PRECAUTIONS: Fall, patient reports she was told not to lift more than 10 lbs  FALLS:  Has patient fallen in last 6 months? Yes. Number of falls 3-4, right knee gives out or sharp pains could cause her to fall  PATIENT GOALS: Pain relief, get back to doing normal things   OBJECTIVE:  Note: Objective measures were completed at Evaluation unless otherwise noted. PATIENT SURVEYS:  Modified Oswestry 37/50 (74% disability)   SENSATION: Patient reports neuropathy of feet and hands  MUSCLE LENGTH: Unable to fully assess, severe increase in right lower back and leg pain  when attempting to assess hamstring length  POSTURE:  Rounded shoulder posture, normal lumbar lordosis, weight shift toward left and patient frequently changing positions while seated for comfort  PALPATION: Exquisite tenderness to right lower lumbar paraspinals, right gluteal and TFL region, right lateral thigh and knee; moderate tenderness to left lumbar paraspinals  Unable to assess lumbar mobility  LUMBAR ROM:   AROM  eval  Flexion   Extension   Right lateral flexion   Left lateral flexion   Right rotation   Left rotation    (Blank rows = not tested)  LOWER EXTREMITY ROM:    Limitations in right hip ROM due to pain and guarding, right knee flexion limited due to pain  LOWER EXTREMITY MMT:    MMT Right eval Left eval Rt / Lt 07/08/2023  Hip flexion 3+ 4   Hip extension 2 3- 3 / 3  Hip abduction 2    Hip adduction     Hip internal rotation     Hip external rotation     Knee flexion 4- 4   Knee extension 4- 4+   Ankle dorsiflexion     Ankle plantarflexion     Ankle inversion     Ankle eversion      (Blank rows = not tested)  LUMBAR SPECIAL TESTS:  SLR positive on right  FUNCTIONAL TESTS:  Sit to stand: patient will difficulty standing from standard chair, requires UE for support with transferring from sit to stand  GAIT: Assistive device utilized: None Level of assistance: Complete Independence Comments: Antalgic on right   TREATMENT [facility] Adult PT Treatment:                                                DATE: 07/15/2023 Table inclined throughout supine/hooklying exercises for patient's breathing, she does not tolerate supine position well LAD for right LE STM using FR for right lower back, gluteal region and thigh with patient in sidelying Electrical Stimulation combined with cold pack Location: Lumbar right Action: IFC Parameters: Standard settings, intensity to patient tolerance, 15 min Goals: Pain relief and Reduced muscle tension   PATIENT  EDUCATION:  Education details: HEP Person educated: Patient Education method: Programmer, multimedia, Facilities manager, Actor cues, Verbal cues Education comprehension: verbalized understanding, returned demonstration, verbal cues required, tactile cues required, and needs further education  HOME EXERCISE PROGRAM: Access Code: [email or web address]    ASSESSMENT: CLINICAL IMPRESSION: Patient limited in therapy this visit due to right sided back pain. She was unable to tolerate any exercises so did perform some light manual therapy for the right side and utilized e-stim IFC with cold pack to the right lumbar region with patient reporting a mild improvement following therapy. No changes made to her HEP but patient was encouraged to perform gentle movements and exercises as tolerated at home. Patient would benefit from continued skilled PT to progress mobility and strength in order to reduce pain and maximize functional ability.   Eval: Patient is a 48 y.o. female who was seen today for physical therapy evaluation and treatment for chronic lower back and right knee pain. Her symptoms do seem consistent with nonspecific lower back pain and right sided lumbar radiculopathy. Evaluation was limited this visit due to patient's high symptom severity and irritability level. She did become tearful during the evaluation. Trial of e-stim IFC combined with cold pack with patient reporting pain level reduction, and provided patient with information for purchasing home TENS unit for pain relief. Unable to provide patient with HEP this visit due to inability to complete exercise without severe pain. Patient was provided extensive education on possible etiologies of her symptoms and treatments that could be utilized in therapy, and gradual progression for return to activity.   OBJECTIVE IMPAIRMENTS: Abnormal gait, decreased  activity tolerance, decreased balance, decreased ROM, decreased strength, impaired flexibility, postural dysfunction,  and pain.   ACTIVITY LIMITATIONS: carrying, lifting, bending, sitting, standing, squatting, sleeping, stairs, transfers, bed mobility, locomotion level, and caring for others  PARTICIPATION LIMITATIONS: meal prep, cleaning, laundry, driving, shopping, and community activity  PERSONAL FACTORS: Fitness, Past/current experiences, Time since onset of injury/illness/exacerbation, and 3+ comorbidities: see PMH above  are also affecting patient's functional outcome.    GOALS: Goals reviewed with patient? Yes  SHORT TERM GOALS: Target date: 07/31/2023  Patient will be I with initial HEP in order to progress with therapy. Baseline: None provided at eval Goal status: INITIAL  2.  Patient will report low back and right leg pain </= 7/10 at worst to reduce functional limitations Baseline: 10/10 pain at worst Goal status: INITIAL  3.  Patient will be able to perform sit to stand without difficulty or using UE for support to improve mobility and indicate improvement in LE strength Baseline: requires UE for support with standing from chair Goal status: INITIAL  LONG TERM GOALS: Target date: 08/28/2023  Patient will be I with final HEP to maintain progress from PT. Baseline: None provided at eval Goal status: INITIAL  2.  Patient will report Modified Oswestry </= 25/50 (50% disability) in order to indicate an improvement in their functional status Baseline: 37/50 (74% disability) Goal status: INITIAL  3.  Patient will demonstrate improvement in core and hip strength grossly >/= 4-/5 MMT in order to improve her activity tolerance with household tasks Baseline: see limitations above Goal status: INITIAL  4.  Patient will demonstrate right knee strength >/= 4+/5 MMT in order to improve stair negotiation and reduce fall risk due to right knee giving out Baseline: see limitations above Goal status: INITIAL  5. Patient will report lower back and right leg pain </= 4/10 in order to reduce  functional limitations  Baseline: 10/10 at worst  Goal status: INITIAL   PLAN: PT FREQUENCY: 1-2x/week  PT DURATION: 8 weeks  PLANNED INTERVENTIONS: 97164- PT Re-evaluation, 97110-Therapeutic exercises, 97530- Therapeutic activity, 97112- Neuromuscular re-education, 97535- Self Care, 16109- Manual therapy, [id number]- Electrical stimulation (unattended), [phone]- Electrical stimulation (manual), Patient/Family education, Balance training, Taping, Dry Needling, Joint mobilization, Joint manipulation, Spinal manipulation, Spinal mobilization, Cryotherapy, and Moist heat.  PLAN FOR NEXT SESSION: Review HEP and progress PRN, continue with modalities for pain relief as needed, manual/TPDN for right lumbar and gluteal region, trial LAD, initiate gentle stretching and mobility for lumbar, initiate gentle core activation, hip and LE strengthening as tolerated    Leah Primus, PT, DPT, LAT, ATC 07/15/23  9:32 AM Phone: [phone] Fax: [phone]

## 2023-07-17 ENCOUNTER — Encounter: Payer: Self-pay | Admitting: Physical Therapy

## 2023-07-17 ENCOUNTER — Ambulatory Visit (INDEPENDENT_AMBULATORY_CARE_PROVIDER_SITE_OTHER): Admitting: Physical Therapy

## 2023-07-17 ENCOUNTER — Other Ambulatory Visit: Payer: Self-pay

## 2023-07-17 DIAGNOSIS — M79604 Pain in right leg: Secondary | ICD-10-CM

## 2023-07-17 DIAGNOSIS — M5459 Other low back pain: Secondary | ICD-10-CM | POA: Diagnosis not present

## 2023-07-17 DIAGNOSIS — M6281 Muscle weakness (generalized): Secondary | ICD-10-CM | POA: Diagnosis not present

## 2023-07-17 NOTE — Therapy (Addendum)
 OUTPATIENT PHYSICAL THERAPY TREATMENT   Patient Name: Erica [name] MRN: [id number] DOB:15-Mar-1976, 48 y.o., female Today's Date: 07/17/2023   END OF SESSION:  PT End of Session - 07/17/23 0854     Visit Number 5    Number of Visits 17    Date for PT Re-Evaluation 08/28/23    Authorization Type [employer] MCR    Progress Note Due on Visit 10    PT Start Time 0846    PT Stop Time 0926    PT Time Calculation (min) 40 min    Activity Tolerance Patient tolerated treatment well    Behavior During Therapy [facility] for tasks assessed/performed                 Past Medical History:  Diagnosis Date   Allergy    Anxiety    Apnea, sleep 05/04/2014   AHI 31/hr now on CPAP at 12cm H2O   Arthritis    Asthma    Asthma without status asthmaticus 01/19/2016   Axillary hidradenitis suppurativa 08/01/2011   Blood transfusion without reported diagnosis    Breast cancer (HCC) 2010   a. locally advanced left breast carcinoma diagnosed in 2010 and treated with neoadjuvant chemotherapy with docetaxel and cyclophosphamide as well as paclitaxel. This was followed by radiation therapy which was completed in 2011.   Chemotherapy-induced peripheral neuropathy (HCC) 12/09/2016   CHF (congestive heart failure) (HCC)    Chronic diastolic CHF (congestive heart failure) (HCC) 06/23/2013   Chronic kidney disease    Chronic pain in left foot 06/29/2018   Chronic stable angina (HCC) 08/22/2020   Cough variant asthma 09/27/2010   Followed in Pulmonary clinic/ [facility]/ [place]  Onset reported in childhood  - PFT's 01/31/2011 FEV1  1.76 (60% ) ratio 68 and no better p B2,  DLCO 70% -med review 09/25/2012 > [place] drug assistance  paperwork given    - HFA 90% 11/20/2012 .  -Med calendar 12/07/12 , 04/06/2013 , 04/18/2014,, 08/30/2014 , 11/08/2014  - PFT's 01/12/2013  1.73 (65%)  Ratio 73 and no better p B2, DLCO 86% - trial off    Depression with somatization 06/15/2015   Dyspnea on exertion    a. 01/2013 Lexi MV:  EF 54%, no ischemia/infarct;  b. 05/2013 Echo: EF 60-65%, no rwma, Gr 2 DD;  b.    Essential hypertension 10/09/2014   Estimated Creatinine Clearance: 82.4 mL/min (by C-G formula based on SCr of 0.98 mg/dL).   Fatty liver    Gastroparesis    GERD (gastroesophageal reflux disease)    Hyperglycemia 04/13/2011   Hypertension    Hypertensive heart disease 09/26/2015   Hypertriglyceridemia without hypercholesterolemia 06/15/2015   Framingham risk score is 1%    Idiopathic intracranial hypertension 10/21/2014   Impaired glucose tolerance 04/13/2011   Insomnia secondary to anxiety 04/05/2015   Lymphadenitis, chronic    restricted LEFT extremity   Malignant neoplasm of upper-outer quadrant of left breast in female, estrogen receptor negative (HCC) 01/07/2013   Stage III, Invasive Ductal, s/p partial Left Mastectomy/Lumpectomy (baseball sized) with Lymph Node dissection, had Chemo and RadRx     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   OSA (obstructive sleep apnea) 05/04/2014   AHI 31/hr now on CPAP at 12cm H2O   Oxygen deficiency    Uses 02 in CPAP   Persistent proteinuria 03/31/2018   Personal history of chemotherapy 2010   Personal history of radiation therapy 2010   Resistant hypertension 09/17/2011  Sleep apnea    SVT (supraventricular tachycardia) (HCC) 08/22/2020   Tibialis posterior tendinitis, left 07/15/2018   Injected in March 10, 2019   Ulcer    Upper airway cough syndrome 10/09/2014   Onset around 2012 with assoc pseudowheeze MRI 05/18/15 > Paranasal sinuses are clear - 11/17/2015 rec increase h1 to 2 hs to help with noct cough  - Allergy profile 11/17/2015 >  Eos 0.1 /  IgE  80 POS RAST  to grass, ragweed, cedar tree  - d/c acei 03/06/2018  - flare end of Jan 2020 in setting of ? Uri/ poorly controlled GERD - flare mid Oct 2020 with uri / assoc secondary gerd   Vitamin D deficiency disease 03/31/2018   Past Surgical History:  Procedure Laterality Date   BRAIN SURGERY      Shunt   BREAST LUMPECTOMY Left 09/2008   CARPAL TUNNEL RELEASE Left 2011   LYMPH NODE DISSECTION Left 2010   breast; 2 wks after breast lumpectomy   RENAL DENERVATION N/A 11/20/2022   Procedure: RENAL DENERVATION;  Surgeon: Iran Ouch, MD;  Location: [facility] INVASIVE CV LAB;  Service: Cardiovascular;  Laterality: N/A;   RIGHT/LEFT HEART CATH AND CORONARY ANGIOGRAPHY N/A 05/04/2019   Procedure: RIGHT/LEFT HEART CATH AND CORONARY ANGIOGRAPHY;  Surgeon: Dolores Patty, MD;  Location: [facility] INVASIVE CV [facility];  Service: Cardiovascular;  Laterality: N/A;  Renal injection preformed    RIGHT/LEFT HEART CATH AND CORONARY ANGIOGRAPHY N/A 10/25/2020   Procedure: RIGHT/LEFT HEART CATH AND CORONARY ANGIOGRAPHY;  Surgeon: Marykay Lex, MD;  Location: [facility] INVASIVE CV LAB;  Service: Cardiovascular;  Laterality: N/A;   TENDON RELEASE Right 2012   "hand"   TUBAL LIGATION  05/11/2011   Procedure: POST PARTUM TUBAL LIGATION;  Surgeon: Scheryl Darter, MD;  Location: [facility];  Service: Gynecology;  Laterality: Bilateral;  Induced for HTN   Patient Active Problem List   Diagnosis Date Noted   Prediabetes 06/13/2023   Moderate episode of recurrent major depressive disorder (HCC) 02/04/2023   GAD (generalized anxiety disorder) 02/04/2023   Chronic respiratory failure with hypoxia (HCC) 12/31/2021   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   Coronary artery disease    Chronic stable angina (HCC) 08/22/2020   SVT (supraventricular tachycardia) (HCC) 08/22/2020   Palpitations 02/28/2020   Right ankle pain 04/14/2019   OSA (obstructive sleep apnea) 10/01/2018   Tibialis posterior tendinitis, left 07/15/2018   Chronic pain in left foot 06/29/2018   Vitamin D deficiency disease 03/31/2018   Persistent proteinuria 03/31/2018   Hyperlipidemia LDL goal <100 03/30/2018   Chemotherapy-induced peripheral neuropathy (HCC) 12/09/2016   Asthma in adult, mild persistent, uncomplicated 01/19/2016   Hypertensive heart disease 09/26/2015    Depression with somatization 06/15/2015   Hypertriglyceridemia without hypercholesterolemia 06/15/2015   Hypokalemia 06/15/2015   Insomnia secondary to anxiety 04/05/2015   Idiopathic intracranial hypertension 10/21/2014   Upper airway cough syndrome 10/09/2014   Malignant hypertension 10/09/2014   Morbid obesity due to excess calories (HCC) 10/09/2014   Apnea, sleep 05/04/2014   Acute on chronic diastolic CHF (congestive heart failure), NYHA class 2 (HCC) 05/03/2014   DOE (dyspnea on exertion) 07/14/2013   Acute on chronic diastolic heart failure (HCC) 06/23/2013   Malignant neoplasm of upper-outer quadrant of left breast in female, estrogen receptor negative (HCC) 01/07/2013   Gastroparesis 01/04/2013   Resistant hypertension 09/17/2011   Axillary hidradenitis suppurativa 08/01/2011   Preventative health care 04/13/2011   Hyperglycemia 04/13/2011   GERD (gastroesophageal reflux disease) 03/18/2011  Cough variant asthma 09/27/2010    PCP: Nonda Bays, FNP  REFERRING PROVIDER: Syliva Even, MD  REFERRING DIAG: Lumbar radiculopathy  Rationale for Evaluation and Treatment: Rehabilitation  THERAPY DIAG:  Other low back pain  Pain in right leg  Muscle weakness (generalized)  ONSET DATE: August 2024   SUBJECTIVE:     SUBJECTIVE STATEMENT: Patient reports she is feeling better this visit compared to the last. The right side is still a little irritated.    Eval: Patients reports she had renal denervation done in August 2024 and has been in pain ever since. Pain is located across the lower back, she was told it would hurt for 3-4 months but pain persisted in the lower back. The right knee pain started after in October or November, and it will keep her up at night. She states it feels heavy in her lower back, like labor pain that never stops. The pain will start in the right lower back region and then will feel like lightning shooting down the right leg to the back and  outside of the right knee, pain does not go past the knee. She leans on the bed for relief, and she states getting on her stomach gives her the most relief, but she has a lot of pain when getting up. She does use a heating pad as well. She reports that to help with the knee pain at night she has to have her leg up on the wall. She states she moves around a lot because sitting or standing for long periods will make it worse. Will occasionally have trouble bending down and she states the right leg has given out on her causing her to fall. She does have trouble going up/down stairs, she turns to the side when going down, and she also has breathing issues so when she goes up stairs to get inside she has to immediately get on oxygen. She reports inability to play with or lift her grandchildren and difficulty completing activities around her home for her son.  PERTINENT HISTORY:  See PMH above  PAIN:  Are you having pain? Yes:  NPRS scale: 6/10 currently, 10/10 at worst Pain location: Lower back and right leg/knee Pain description: Heavy, labor pains, constant Aggravating factors: Standing, sitting, activity Relieving factors: Lying on stomach, leaning on bed or counter, heat  PRECAUTIONS: Fall, patient reports she was told not to lift more than 10 lbs  FALLS:  Has patient fallen in last 6 months? Yes. Number of falls 3-4, right knee gives out or sharp pains could cause her to fall  PATIENT GOALS: Pain relief, get back to doing normal things   OBJECTIVE:  Note: Objective measures were completed at Evaluation unless otherwise noted. PATIENT SURVEYS:  Modified Oswestry 37/50 (74% disability)   SENSATION: Patient reports neuropathy of feet and hands  MUSCLE LENGTH: Unable to fully assess, severe increase in right lower back and leg pain when attempting to assess hamstring length  POSTURE:  Rounded shoulder posture, normal lumbar lordosis, weight shift toward left and patient frequently  changing positions while seated for comfort  PALPATION: Exquisite tenderness to right lower lumbar paraspinals, right gluteal and TFL region, right lateral thigh and knee; moderate tenderness to left lumbar paraspinals  Unable to assess lumbar mobility  LUMBAR ROM:   AROM eval  Flexion   Extension   Right lateral flexion   Left lateral flexion   Right rotation   Left rotation    (Blank rows =  not tested)  LOWER EXTREMITY ROM:    Limitations in right hip ROM due to pain and guarding, right knee flexion limited due to pain  LOWER EXTREMITY MMT:    MMT Right eval Left eval Rt / Lt 07/08/2023  Hip flexion 3+ 4   Hip extension 2 3- 3 / 3  Hip abduction 2    Hip adduction     Hip internal rotation     Hip external rotation     Knee flexion 4- 4   Knee extension 4- 4+   Ankle dorsiflexion     Ankle plantarflexion     Ankle inversion     Ankle eversion      (Blank rows = not tested)  LUMBAR SPECIAL TESTS:  SLR positive on right  FUNCTIONAL TESTS:  Sit to stand: patient will difficulty standing from standard chair, requires UE for support with transferring from sit to stand  GAIT: Assistive device utilized: None Level of assistance: Complete Independence Comments: Antalgic on right   TREATMENT [facility] Adult PT Treatment:                                                DATE: 07/17/2023 Table inclined throughout supine/hooklying exercises for patient's breathing, she does not tolerate supine position well Recumbent bike L1 x 5 min to improve endurance and workload capacity LAQ with 2# 2 x 10 Sit to stand x 10 Hooklying clamshell with yellow 2 x 10 Hooklying adductor ball squeeze 2 x 10 SLR 2 x 5 Seated stability ball roll out x 5 Seated abdominal set with stability ball press 10 x 3 sec   PATIENT EDUCATION:  Education details: HEP update Person educated: Patient Education method: Explanation, Demonstration, Tactile cues, Verbal cues, Handout Education  comprehension: verbalized understanding, returned demonstration, verbal cues required, tactile cues required, and needs further education  HOME EXERCISE PROGRAM: Access Code: [email or web address]    ASSESSMENT: CLINICAL IMPRESSION: Patient arrives reporting improvement in her symptoms since last visit. Therapy focused on progressing her endurance, strength, and mobility with better tolerance. She was able to progress some with her LE strengthening exercises and incorporate sit to stands with fair tolerance. She does continue to be limited by pain and she was encouraged to use a knee sleeve or brace as need for the right knee support and pain. Updated her HEP to progress LE strengthening and mobility for home. Patient would benefit from continued skilled PT to progress mobility and strength in order to reduce pain and maximize functional ability.   Eval: Patient is a 48 y.o. female who was seen today for physical therapy evaluation and treatment for chronic lower back and right knee pain. Her symptoms do seem consistent with nonspecific lower back pain and right sided lumbar radiculopathy. Evaluation was limited this visit due to patient's high symptom severity and irritability level. She did become tearful during the evaluation. Trial of e-stim IFC combined with cold pack with patient reporting pain level reduction, and provided patient with information for purchasing home TENS unit for pain relief. Unable to provide patient with HEP this visit due to inability to complete exercise without severe pain. Patient was provided extensive education on possible etiologies of her symptoms and treatments that could be utilized in therapy, and gradual progression for return to activity.   OBJECTIVE IMPAIRMENTS: Abnormal gait, decreased activity tolerance, decreased balance, decreased  ROM, decreased strength, impaired flexibility, postural dysfunction, and pain.   ACTIVITY LIMITATIONS: carrying, lifting, bending, sitting,  standing, squatting, sleeping, stairs, transfers, bed mobility, locomotion level, and caring for others  PARTICIPATION LIMITATIONS: meal prep, cleaning, laundry, driving, shopping, and community activity  PERSONAL FACTORS: Fitness, Past/current experiences, Time since onset of injury/illness/exacerbation, and 3+ comorbidities: see PMH above  are also affecting patient's functional outcome.    GOALS: Goals reviewed with patient? Yes  SHORT TERM GOALS: Target date: 07/31/2023  Patient will be I with initial HEP in order to progress with therapy. Baseline: None provided at eval Goal status: INITIAL  2.  Patient will report low back and right leg pain </= 7/10 at worst to reduce functional limitations Baseline: 10/10 pain at worst Goal status: INITIAL  3.  Patient will be able to perform sit to stand without difficulty or using UE for support to improve mobility and indicate improvement in LE strength Baseline: requires UE for support with standing from chair Goal status: INITIAL  LONG TERM GOALS: Target date: 08/28/2023  Patient will be I with final HEP to maintain progress from PT. Baseline: None provided at eval Goal status: INITIAL  2.  Patient will report Modified Oswestry </= 25/50 (50% disability) in order to indicate an improvement in their functional status Baseline: 37/50 (74% disability) Goal status: INITIAL  3.  Patient will demonstrate improvement in core and hip strength grossly >/= 4-/5 MMT in order to improve her activity tolerance with household tasks Baseline: see limitations above Goal status: INITIAL  4.  Patient will demonstrate right knee strength >/= 4+/5 MMT in order to improve stair negotiation and reduce fall risk due to right knee giving out Baseline: see limitations above Goal status: INITIAL  5. Patient will report lower back and right leg pain </= 4/10 in order to reduce functional limitations  Baseline: 10/10 at worst  Goal status:  INITIAL   PLAN: PT FREQUENCY: 1-2x/week  PT DURATION: 8 weeks  PLANNED INTERVENTIONS: 97164- PT Re-evaluation, 97110-Therapeutic exercises, 97530- Therapeutic activity, 97112- Neuromuscular re-education, 97535- Self Care, 29528- Manual therapy, [id number]- Electrical stimulation (unattended), [phone]- Electrical stimulation (manual), Patient/Family education, Balance training, Taping, Dry Needling, Joint mobilization, Joint manipulation, Spinal manipulation, Spinal mobilization, Cryotherapy, and Moist heat.  PLAN FOR NEXT SESSION: Review HEP and progress PRN, continue with modalities for pain relief as needed, manual/TPDN for right lumbar and gluteal region, trial LAD, initiate gentle stretching and mobility for lumbar, initiate gentle core activation, hip and LE strengthening as tolerated    Leah Primus, PT, DPT, LAT, ATC 07/17/23  9:26 AM Phone: [phone] Fax: [phone]

## 2023-07-22 ENCOUNTER — Other Ambulatory Visit: Payer: Self-pay

## 2023-07-22 ENCOUNTER — Encounter: Payer: Self-pay | Admitting: Physical Therapy

## 2023-07-22 ENCOUNTER — Ambulatory Visit (INDEPENDENT_AMBULATORY_CARE_PROVIDER_SITE_OTHER): Admitting: Physical Therapy

## 2023-07-22 DIAGNOSIS — M79604 Pain in right leg: Secondary | ICD-10-CM

## 2023-07-22 DIAGNOSIS — M6281 Muscle weakness (generalized): Secondary | ICD-10-CM | POA: Diagnosis not present

## 2023-07-22 DIAGNOSIS — M5459 Other low back pain: Secondary | ICD-10-CM | POA: Diagnosis not present

## 2023-07-22 NOTE — Therapy (Signed)
 OUTPATIENT PHYSICAL THERAPY TREATMENT   Patient Name: Erica [name] MRN: [id number] DOB:13-Dec-1975, 48 y.o., female Today's Date: 07/22/2023   END OF SESSION:  PT End of Session - 07/22/23 0850     Visit Number 6    Number of Visits 17    Date for PT Re-Evaluation 08/28/23    Authorization Type [employer] MCR    Progress Note Due on Visit 10    PT Start Time 0846    PT Stop Time 0925    PT Time Calculation (min) 39 min    Activity Tolerance Patient tolerated treatment well    Behavior During Therapy [facility] for tasks assessed/performed                  Past Medical History:  Diagnosis Date   Allergy     Anxiety    Apnea, sleep 05/04/2014   AHI 31/hr now on CPAP at 12cm H2O   Arthritis    Asthma    Asthma without status asthmaticus 01/19/2016   Axillary hidradenitis suppurativa 08/01/2011   Blood transfusion without reported diagnosis    Breast cancer (HCC) 2010   a. locally advanced left breast carcinoma diagnosed in 2010 and treated with neoadjuvant chemotherapy with docetaxel and cyclophosphamide as well as paclitaxel. This was followed by radiation therapy which was completed in 2011.   Chemotherapy-induced peripheral neuropathy (HCC) 12/09/2016   CHF (congestive heart failure) (HCC)    Chronic diastolic CHF (congestive heart failure) (HCC) 06/23/2013   Chronic kidney disease    Chronic pain in left foot 06/29/2018   Chronic stable angina (HCC) 08/22/2020   Cough variant asthma 09/27/2010   Followed in Pulmonary clinic/ [facility]/ [place]  Onset reported in childhood  - PFT's 01/31/2011 FEV1  1.76 (60% ) ratio 68 and no better p B2,  DLCO 70% -med review 09/25/2012 > [place]  drug assistance  paperwork given    - HFA 90% 11/20/2012 .  -Med calendar 12/07/12 , 04/06/2013 , 04/18/2014,, 08/30/2014 , 11/08/2014  - PFT's 01/12/2013  1.73 (65%)  Ratio 73 and no better p B2, DLCO 86% - trial off    Depression with somatization 06/15/2015   Dyspnea on exertion    a. 01/2013 Lexi MV:  EF 54%, no ischemia/infarct;  b. 05/2013 Echo: EF 60-65%, no rwma, Gr 2 DD;  b.    Essential hypertension 10/09/2014   Estimated Creatinine Clearance: 82.4 mL/min (by C-G formula based on SCr of 0.98 mg/dL).   Fatty liver    Gastroparesis    GERD (gastroesophageal reflux disease)    Hyperglycemia 04/13/2011   Hypertension    Hypertensive heart disease 09/26/2015   Hypertriglyceridemia without hypercholesterolemia 06/15/2015   Framingham risk score is 1%    Idiopathic intracranial hypertension 10/21/2014   Impaired glucose tolerance 04/13/2011   Insomnia secondary to anxiety 04/05/2015   Lymphadenitis, chronic    restricted LEFT extremity   Malignant neoplasm of upper-outer quadrant of left breast in female, estrogen receptor negative (HCC) 01/07/2013   Stage III, Invasive Ductal, s/p partial Left Mastectomy/Lumpectomy (baseball sized) with Lymph Node dissection, had Chemo and RadRx     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   OSA (obstructive sleep apnea) 05/04/2014   AHI 31/hr now on CPAP at 12cm H2O   Oxygen deficiency    Uses 02 in CPAP   Persistent proteinuria 03/31/2018   Personal history of chemotherapy 2010   Personal history of radiation therapy 2010   Resistant hypertension 09/17/2011  Sleep apnea    SVT (supraventricular tachycardia) (HCC) 08/22/2020   Tibialis posterior tendinitis, left 07/15/2018   Injected in March 10, 2019   Ulcer    Upper airway cough syndrome 10/09/2014   Onset around 2012 with assoc pseudowheeze MRI 05/18/15 > Paranasal sinuses are clear - 11/17/2015 rec increase h1 to 2 hs to help with noct cough  - Allergy  profile 11/17/2015 >  Eos 0.1 /  IgE  80 POS RAST  to grass, ragweed, cedar tree  - d/c acei 03/06/2018  - flare end of Jan 2020 in setting of ? Uri/ poorly controlled GERD - flare mid Oct 2020 with uri / assoc secondary gerd   Vitamin D  deficiency disease 03/31/2018   Past Surgical History:  Procedure Laterality Date   BRAIN SURGERY      Shunt   BREAST LUMPECTOMY Left 09/2008   CARPAL TUNNEL RELEASE Left 2011   LYMPH NODE DISSECTION Left 2010   breast; 2 wks after breast lumpectomy   RENAL DENERVATION N/A 11/20/2022   Procedure: RENAL DENERVATION;  Surgeon: Wenona Hamilton, MD;  Location: [facility] INVASIVE CV LAB;  Service: Cardiovascular;  Laterality: N/A;   RIGHT/LEFT HEART CATH AND CORONARY ANGIOGRAPHY N/A 05/04/2019   Procedure: RIGHT/LEFT HEART CATH AND CORONARY ANGIOGRAPHY;  Surgeon: Mardell Shade, MD;  Location: [facility] INVASIVE CV LAB;  Service: Cardiovascular;  Laterality: N/A;  Renal injection preformed    RIGHT/LEFT HEART CATH AND CORONARY ANGIOGRAPHY N/A 10/25/2020   Procedure: RIGHT/LEFT HEART CATH AND CORONARY ANGIOGRAPHY;  Surgeon: Arleen Lacer, MD;  Location: [facility] INVASIVE CV LAB;  Service: Cardiovascular;  Laterality: N/A;   TENDON RELEASE Right 2012   "hand"   TUBAL LIGATION  05/11/2011   Procedure: POST PARTUM TUBAL LIGATION;  Surgeon: Onnie Bilis, MD;  Location: [facility] OR[facility];  Service: Gynecology;  Laterality: Bilateral;  Induced for HTN   Patient Active Problem List   Diagnosis Date Noted   Prediabetes 06/13/2023   Moderate episode of recurrent major depressive disorder (HCC) 02/04/2023   GAD (generalized anxiety disorder) 02/04/2023   Chronic respiratory failure with hypoxia (HCC) 12/31/2021   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   Coronary artery disease    Chronic stable angina (HCC) 08/22/2020   SVT (supraventricular tachycardia) (HCC) 08/22/2020   Palpitations 02/28/2020   Right ankle pain 04/14/2019   OSA (obstructive sleep apnea) 10/01/2018   Tibialis posterior tendinitis, left 07/15/2018   Chronic pain in left foot 06/29/2018   Vitamin D  deficiency disease 03/31/2018   Persistent proteinuria 03/31/2018   Hyperlipidemia LDL goal <100 03/30/2018   Chemotherapy-induced peripheral neuropathy (HCC) 12/09/2016   Asthma in adult, mild persistent, uncomplicated 01/19/2016   Hypertensive heart disease 09/26/2015    Depression with somatization 06/15/2015   Hypertriglyceridemia without hypercholesterolemia 06/15/2015   Hypokalemia 06/15/2015   Insomnia secondary to anxiety 04/05/2015   Idiopathic intracranial hypertension 10/21/2014   Upper airway cough syndrome 10/09/2014   Malignant hypertension 10/09/2014   Morbid obesity due to excess calories (HCC) 10/09/2014   Apnea, sleep 05/04/2014   Acute on chronic diastolic CHF (congestive heart failure), NYHA class 2 (HCC) 05/03/2014   DOE (dyspnea on exertion) 07/14/2013   Acute on chronic diastolic heart failure (HCC) 06/23/2013   Malignant neoplasm of upper-outer quadrant of left breast in female, estrogen receptor negative (HCC) 01/07/2013   Gastroparesis 01/04/2013   Resistant hypertension 09/17/2011   Axillary hidradenitis suppurativa 08/01/2011   Preventative health care 04/13/2011   Hyperglycemia 04/13/2011   GERD (gastroesophageal reflux disease) 03/18/2011  Cough variant asthma 09/27/2010    PCP: Nonda Bays, FNP  REFERRING PROVIDER: Syliva Even, MD  REFERRING DIAG: Lumbar radiculopathy  Rationale for Evaluation and Treatment: Rehabilitation  THERAPY DIAG:  Other low back pain  Pain in right leg  Muscle weakness (generalized)  ONSET DATE: August 2024   SUBJECTIVE:     SUBJECTIVE STATEMENT: Patient reports she is doing good today. She went to church over the weekend and states it was hard to sit through the service.   Eval: Patients reports she had renal denervation done in August 2024 and has been in pain ever since. Pain is located across the lower back, she was told it would hurt for 3-4 months but pain persisted in the lower back. The right knee pain started after in October or November, and it will keep her up at night. She states it feels heavy in her lower back, like labor pain that never stops. The pain will start in the right lower back region and then will feel like lightning shooting down the right leg to  the back and outside of the right knee, pain does not go past the knee. She leans on the bed for relief, and she states getting on her stomach gives her the most relief, but she has a lot of pain when getting up. She does use a heating pad as well. She reports that to help with the knee pain at night she has to have her leg up on the wall. She states she moves around a lot because sitting or standing for long periods will make it worse. Will occasionally have trouble bending down and she states the right leg has given out on her causing her to fall. She does have trouble going up/down stairs, she turns to the side when going down, and she also has breathing issues so when she goes up stairs to get inside she has to immediately get on oxygen. She reports inability to play with or lift her grandchildren and difficulty completing activities around her home for her son.  PERTINENT HISTORY:  See PMH above  PAIN:  Are you having pain? Yes:  NPRS scale: 6/10 currently, 10/10 at worst Pain location: Lower back and right leg/knee Pain description: Heavy, labor pains, constant Aggravating factors: Standing, sitting, activity Relieving factors: Lying on stomach, leaning on bed or counter, heat  PRECAUTIONS: Fall, patient reports she was told not to lift more than 10 lbs  FALLS:  Has patient fallen in last 6 months? Yes. Number of falls 3-4, right knee gives out or sharp pains could cause her to fall  PATIENT GOALS: Pain relief, get back to doing normal things   OBJECTIVE:  Note: Objective measures were completed at Evaluation unless otherwise noted. PATIENT SURVEYS:  Modified Oswestry 37/50 (74% disability)   SENSATION: Patient reports neuropathy of feet and hands  MUSCLE LENGTH: Unable to fully assess, severe increase in right lower back and leg pain when attempting to assess hamstring length  POSTURE:  Rounded shoulder posture, normal lumbar lordosis, weight shift toward left and patient  frequently changing positions while seated for comfort  PALPATION: Exquisite tenderness to right lower lumbar paraspinals, right gluteal and TFL region, right lateral thigh and knee; moderate tenderness to left lumbar paraspinals  Unable to assess lumbar mobility  LUMBAR ROM:   AROM eval  Flexion   Extension   Right lateral flexion   Left lateral flexion   Right rotation   Left rotation    (  Blank rows = not tested)  LOWER EXTREMITY ROM:    Limitations in right hip ROM due to pain and guarding, right knee flexion limited due to pain  LOWER EXTREMITY MMT:    MMT Right eval Left eval Rt / Lt 07/08/2023 Right 07/22/2023  Hip flexion 3+ 4    Hip extension 2 3- 3 / 3   Hip abduction 2     Hip adduction      Hip internal rotation      Hip external rotation      Knee flexion 4- 4  4  Knee extension 4- 4+  4  Ankle dorsiflexion      Ankle plantarflexion      Ankle inversion      Ankle eversion       (Blank rows = not tested)  LUMBAR SPECIAL TESTS:  SLR positive on right  FUNCTIONAL TESTS:  Sit to stand: patient will difficulty standing from standard chair, requires UE for support with transferring from sit to stand  GAIT: Assistive device utilized: None Level of assistance: Complete Independence Comments: Antalgic on right   TREATMENT [facility] Adult PT Treatment:                                                DATE: 07/22/2023 Recumbent bike L1 x 5 min to improve endurance and workload capacity Sidelying STM using FR to right lumbar and gluteal region, lateral thigh, hamstring and quad Seated stability ball roll out 2 x 5 Sit to stand 3 x 5 Seated clamshell with red 2 x 10 Seated abdominal set with stability ball press 2 x 10 x 3 sec LAQ with 2# 3 x 10 each   PATIENT EDUCATION:  Education details: HEP Person educated: Patient Education method: Programmer, multimedia, Demonstration, Actor cues, Verbal cues Education comprehension: verbalized understanding, returned  demonstration, verbal cues required, tactile cues required, and needs further education  HOME EXERCISE PROGRAM: Access Code: [email or web address]    ASSESSMENT: CLINICAL IMPRESSION: Patient tolerated therapy well with no adverse effects. Therapy continued to work on progressing her core, hip, and LE strengthening. She does exhibit an improvement in her right knee strength this visit. She was able to complete all prescribed exercises despite continuing to report high pain levels of her left lower back and knee. She did report trying right knee brace but it was too big for her. No changes made to her HEP this visit. Patient would benefit from continued skilled PT to progress mobility and strength in order to reduce pain and maximize functional ability.   Eval: Patient is a 48 y.o. female who was seen today for physical therapy evaluation and treatment for chronic lower back and right knee pain. Her symptoms do seem consistent with nonspecific lower back pain and right sided lumbar radiculopathy. Evaluation was limited this visit due to patient's high symptom severity and irritability level. She did become tearful during the evaluation. Trial of e-stim IFC combined with cold pack with patient reporting pain level reduction, and provided patient with information for purchasing home TENS unit for pain relief. Unable to provide patient with HEP this visit due to inability to complete exercise without severe pain. Patient was provided extensive education on possible etiologies of her symptoms and treatments that could be utilized in therapy, and gradual progression for return to activity.   OBJECTIVE IMPAIRMENTS: Abnormal gait, decreased  activity tolerance, decreased balance, decreased ROM, decreased strength, impaired flexibility, postural dysfunction, and pain.   ACTIVITY LIMITATIONS: carrying, lifting, bending, sitting, standing, squatting, sleeping, stairs, transfers, bed mobility, locomotion level, and caring for  others  PARTICIPATION LIMITATIONS: meal prep, cleaning, laundry, driving, shopping, and community activity  PERSONAL FACTORS: Fitness, Past/current experiences, Time since onset of injury/illness/exacerbation, and 3+ comorbidities: see PMH above  are also affecting patient's functional outcome.    GOALS: Goals reviewed with patient? Yes  SHORT TERM GOALS: Target date: 07/31/2023  Patient will be I with initial HEP in order to progress with therapy. Baseline: None provided at eval Goal status: INITIAL  2.  Patient will report low back and right leg pain </= 7/10 at worst to reduce functional limitations Baseline: 10/10 pain at worst Goal status: INITIAL  3.  Patient will be able to perform sit to stand without difficulty or using UE for support to improve mobility and indicate improvement in LE strength Baseline: requires UE for support with standing from chair Goal status: INITIAL  LONG TERM GOALS: Target date: 08/28/2023  Patient will be I with final HEP to maintain progress from PT. Baseline: None provided at eval Goal status: INITIAL  2.  Patient will report Modified Oswestry </= 25/50 (50% disability) in order to indicate an improvement in their functional status Baseline: 37/50 (74% disability) Goal status: INITIAL  3.  Patient will demonstrate improvement in core and hip strength grossly >/= 4-/5 MMT in order to improve her activity tolerance with household tasks Baseline: see limitations above Goal status: INITIAL  4.  Patient will demonstrate right knee strength >/= 4+/5 MMT in order to improve stair negotiation and reduce fall risk due to right knee giving out Baseline: see limitations above Goal status: INITIAL  5. Patient will report lower back and right leg pain </= 4/10 in order to reduce functional limitations  Baseline: 10/10 at worst  Goal status: INITIAL   PLAN: PT FREQUENCY: 1-2x/week  PT DURATION: 8 weeks  PLANNED INTERVENTIONS: [id number]- PT  Re-evaluation, 97110-Therapeutic exercises, 97530- Therapeutic activity, 97112- Neuromuscular re-education, 97535- Self Care, 72536- Manual therapy, [id number]- Electrical stimulation (unattended), [id number]- Electrical stimulation (manual), Patient/Family education, Balance training, Taping, Dry Needling, Joint mobilization, Joint manipulation, Spinal manipulation, Spinal mobilization, Cryotherapy, and Moist heat.  PLAN FOR NEXT SESSION: Review HEP and progress PRN, continue with modalities for pain relief as needed, manual/TPDN for right lumbar and gluteal region, trial LAD, initiate gentle stretching and mobility for lumbar, initiate gentle core activation, hip and LE strengthening as tolerated    Leah Primus, PT, DPT, LAT, ATC 07/22/23  9:33 AM Phone: [phone] Fax: [phone]

## 2023-07-24 ENCOUNTER — Other Ambulatory Visit: Payer: Self-pay

## 2023-07-24 ENCOUNTER — Encounter: Payer: Self-pay | Admitting: Physical Therapy

## 2023-07-24 ENCOUNTER — Ambulatory Visit (INDEPENDENT_AMBULATORY_CARE_PROVIDER_SITE_OTHER): Admitting: Physical Therapy

## 2023-07-24 DIAGNOSIS — M6281 Muscle weakness (generalized): Secondary | ICD-10-CM | POA: Diagnosis not present

## 2023-07-24 DIAGNOSIS — M5459 Other low back pain: Secondary | ICD-10-CM

## 2023-07-24 DIAGNOSIS — M79604 Pain in right leg: Secondary | ICD-10-CM

## 2023-07-24 NOTE — Therapy (Signed)
 OUTPATIENT PHYSICAL THERAPY TREATMENT   Patient Name: Erica [name] MRN: [id number] DOB:[birth year], 48 y.o., female Today's Date: 07/24/2023   END OF SESSION:  PT End of Session - 07/24/23 0913     Visit Number 7    Number of Visits 17    Date for PT Re-Evaluation 08/28/23    Authorization Type [employer] MCR    Progress Note Due on Visit 10    PT Start Time 0845    PT Stop Time 0930    PT Time Calculation (min) 45 min    Activity Tolerance Patient tolerated treatment well    Behavior During Therapy [facility] for tasks assessed/performed                   Past Medical History:  Diagnosis Date   Allergy     Anxiety    Apnea, sleep 05/04/2014   AHI 31/hr now on CPAP at 12cm H2O   Arthritis    Asthma    Asthma without status asthmaticus 01/19/2016   Axillary hidradenitis suppurativa 08/01/2011   Blood transfusion without reported diagnosis    Breast cancer (HCC) 2010   a. locally advanced left breast carcinoma diagnosed in 2010 and treated with neoadjuvant chemotherapy with docetaxel and cyclophosphamide as well as paclitaxel. This was followed by radiation therapy which was completed in 2011.   Chemotherapy-induced peripheral neuropathy (HCC) 12/09/2016   CHF (congestive heart failure) (HCC)    Chronic diastolic CHF (congestive heart failure) (HCC) 06/23/2013   Chronic kidney disease    Chronic pain in left foot 06/29/2018   Chronic stable angina (HCC) 08/22/2020   Cough variant asthma 09/27/2010   Followed in Pulmonary clinic/ [facility]/ [place]  Onset reported in childhood  - PFT's 01/31/2011 FEV1  1.76 (60% ) ratio 68 and no better p B2,  DLCO 70% -med review 09/25/2012 > [place]  drug assistance  paperwork given    - HFA 90% 11/20/2012 .  -Med calendar 12/07/12 , 04/06/2013 , 04/18/2014,, 08/30/2014 , 11/08/2014  - PFT's 01/12/2013  1.73 (65%)  Ratio 73 and no better p B2, DLCO 86% - trial off    Depression with somatization 06/15/2015   Dyspnea on exertion    a. 01/2013 Lexi  MV: EF 54%, no ischemia/infarct;  b. 05/2013 Echo: EF 60-65%, no rwma, Gr 2 DD;  b.    Essential hypertension 10/09/2014   Estimated Creatinine Clearance: 82.4 mL/min (by C-G formula based on SCr of 0.98 mg/dL).   Fatty liver    Gastroparesis    GERD (gastroesophageal reflux disease)    Hyperglycemia 04/13/2011   Hypertension    Hypertensive heart disease 09/26/2015   Hypertriglyceridemia without hypercholesterolemia 06/15/2015   Framingham risk score is 1%    Idiopathic intracranial hypertension 10/21/2014   Impaired glucose tolerance 04/13/2011   Insomnia secondary to anxiety 04/05/2015   Lymphadenitis, chronic    restricted LEFT extremity   Malignant neoplasm of upper-outer quadrant of left breast in female, estrogen receptor negative (HCC) 01/07/2013   Stage III, Invasive Ductal, s/p partial Left Mastectomy/Lumpectomy (baseball sized) with Lymph Node dissection, had Chemo and RadRx     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   OSA (obstructive sleep apnea) 05/04/2014   AHI 31/hr now on CPAP at 12cm H2O   Oxygen deficiency    Uses 02 in CPAP   Persistent proteinuria 03/31/2018   Personal history of chemotherapy 2010   Personal history of radiation therapy 2010   Resistant hypertension 09/17/2011  Sleep apnea    SVT (supraventricular tachycardia) (HCC) 08/22/2020   Tibialis posterior tendinitis, left 07/15/2018   Injected in March 10, 2019   Ulcer    Upper airway cough syndrome 10/09/2014   Onset around 2012 with assoc pseudowheeze MRI 05/18/15 > Paranasal sinuses are clear - 11/17/2015 rec increase h1 to 2 hs to help with noct cough  - Allergy  profile 11/17/2015 >  Eos 0.1 /  IgE  80 POS RAST  to grass, ragweed, cedar tree  - d/c acei 03/06/2018  - flare end of Jan 2020 in setting of ? Uri/ poorly controlled GERD - flare mid Oct 2020 with uri / assoc secondary gerd   Vitamin D  deficiency disease 03/31/2018   Past Surgical History:  Procedure Laterality Date   BRAIN SURGERY      Shunt   BREAST LUMPECTOMY Left 09/2008   CARPAL TUNNEL RELEASE Left 2011   LYMPH NODE DISSECTION Left 2010   breast; 2 wks after breast lumpectomy   RENAL DENERVATION N/A 11/20/2022   Procedure: RENAL DENERVATION;  Surgeon: Wenona Hamilton, MD;  Location: [facility] INVASIVE CV LAB;  Service: Cardiovascular;  Laterality: N/A;   RIGHT/LEFT HEART CATH AND CORONARY ANGIOGRAPHY N/A 05/04/2019   Procedure: RIGHT/LEFT HEART CATH AND CORONARY ANGIOGRAPHY;  Surgeon: Mardell Shade, MD;  Location: [facility] INVASIVE CV LAB;  Service: Cardiovascular;  Laterality: N/A;  Renal injection preformed    RIGHT/LEFT HEART CATH AND CORONARY ANGIOGRAPHY N/A 10/25/2020   Procedure: RIGHT/LEFT HEART CATH AND CORONARY ANGIOGRAPHY;  Surgeon: Arleen Lacer, MD;  Location[facility] INVASIVE CV LAB;  Service: Cardiovascular;  Laterality: N/A;   TENDON RELEASE Right 2012   "hand"   TUBAL LIGATION  05/11/2011   Procedure: POST PARTUM TUBAL LIGATION;  Surgeon: Onnie Bilis, MD;  Location: [facility] ORS;  Service: Gynecology;  Laterality: Bilateral;  Induced for HTN   Patient Active Problem List   Diagnosis Date Noted   Prediabetes 06/13/2023   Moderate episode of recurrent major depressive disorder (HCC) 02/04/2023   GAD (generalized anxiety disorder) 02/04/2023   Chronic respiratory failure with hypoxia (HCC) 12/31/2021   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   Coronary artery disease    Chronic stable angina (HCC) 08/22/2020   SVT (supraventricular tachycardia) (HCC) 08/22/2020   Palpitations 02/28/2020   Right ankle pain 04/14/2019   OSA (obstructive sleep apnea) 10/01/2018   Tibialis posterior tendinitis, left 07/15/2018   Chronic pain in left foot 06/29/2018   Vitamin D  deficiency disease 03/31/2018   Persistent proteinuria 03/31/2018   Hyperlipidemia LDL goal <100 03/30/2018   Chemotherapy-induced peripheral neuropathy (HCC) 12/09/2016   Asthma in adult, mild persistent, uncomplicated 01/19/2016   Hypertensive heart disease 09/26/2015    Depression with somatization 06/15/2015   Hypertriglyceridemia without hypercholesterolemia 06/15/2015   Hypokalemia 06/15/2015   Insomnia secondary to anxiety 04/05/2015   Idiopathic intracranial hypertension 10/21/2014   Upper airway cough syndrome 10/09/2014   Malignant hypertension 10/09/2014   Morbid obesity due to excess calories (HCC) 10/09/2014   Apnea, sleep 05/04/2014   Acute on chronic diastolic CHF (congestive heart failure), NYHA class 2 (HCC) 05/03/2014   DOE (dyspnea on exertion) 07/14/2013   Acute on chronic diastolic heart failure (HCC) 06/23/2013   Malignant neoplasm of upper-outer quadrant of left breast in female, estrogen receptor negative (HCC) 01/07/2013   Gastroparesis 01/04/2013   Resistant hypertension 09/17/2011   Axillary hidradenitis suppurativa 08/01/2011   Preventative health care 04/13/2011   Hyperglycemia 04/13/2011   GERD (gastroesophageal reflux disease) 03/18/2011  Cough variant asthma 09/27/2010    PCP: Nonda Bays, FNP  REFERRING PROVIDER: Syliva Even, MD  REFERRING DIAG: Lumbar radiculopathy  Rationale for Evaluation and Treatment: Rehabilitation  THERAPY DIAG:  Other low back pain  Pain in right leg  Muscle weakness (generalized)  ONSET DATE: August 2024   SUBJECTIVE:     SUBJECTIVE STATEMENT: Patient reports she has been feeling "lightning" at her kneecap since yesterday. States it is not all the time.   Eval: Patients reports she had renal denervation done in August 2024 and has been in pain ever since. Pain is located across the lower back, she was told it would hurt for 3-4 months but pain persisted in the lower back. The right knee pain started after in October or November, and it will keep her up at night. She states it feels heavy in her lower back, like labor pain that never stops. The pain will start in the right lower back region and then will feel like lightning shooting down the right leg to the back and  outside of the right knee, pain does not go past the knee. She leans on the bed for relief, and she states getting on her stomach gives her the most relief, but she has a lot of pain when getting up. She does use a heating pad as well. She reports that to help with the knee pain at night she has to have her leg up on the wall. She states she moves around a lot because sitting or standing for long periods will make it worse. Will occasionally have trouble bending down and she states the right leg has given out on her causing her to fall. She does have trouble going up/down stairs, she turns to the side when going down, and she also has breathing issues so when she goes up stairs to get inside she has to immediately get on oxygen. She reports inability to play with or lift her grandchildren and difficulty completing activities around her home for her son.  PERTINENT HISTORY:  See PMH above  PAIN:  Are you having pain? Yes:  NPRS scale: 6/10 currently, 10/10 at worst Pain location: Lower back and right leg/knee Pain description: Heavy, labor pains, constant Aggravating factors: Standing, sitting, activity Relieving factors: Lying on stomach, leaning on bed or counter, heat  PRECAUTIONS: Fall, patient reports she was told not to lift more than 10 lbs  FALLS:  Has patient fallen in last 6 months? Yes. Number of falls 3-4, right knee gives out or sharp pains could cause her to fall  PATIENT GOALS: Pain relief, get back to doing normal things   OBJECTIVE:  Note: Objective measures were completed at Evaluation unless otherwise noted. PATIENT SURVEYS:  Modified Oswestry 37/50 (74% disability)   SENSATION: Patient reports neuropathy of feet and hands  MUSCLE LENGTH: Unable to fully assess, severe increase in right lower back and leg pain when attempting to assess hamstring length  POSTURE:  Rounded shoulder posture, normal lumbar lordosis, weight shift toward left and patient frequently  changing positions while seated for comfort  PALPATION: Exquisite tenderness to right lower lumbar paraspinals, right gluteal and TFL region, right lateral thigh and knee; moderate tenderness to left lumbar paraspinals  Unable to assess lumbar mobility  LUMBAR ROM:   AROM eval  Flexion   Extension   Right lateral flexion   Left lateral flexion   Right rotation   Left rotation    (Blank rows = not tested)  LOWER EXTREMITY ROM:    Limitations in right hip ROM due to pain and guarding, right knee flexion limited due to pain  LOWER EXTREMITY MMT:    MMT Right eval Left eval Rt / Lt 07/08/2023 Right 07/22/2023  Hip flexion 3+ 4    Hip extension 2 3- 3 / 3   Hip abduction 2     Hip adduction      Hip internal rotation      Hip external rotation      Knee flexion 4- 4  4  Knee extension 4- 4+  4  Ankle dorsiflexion      Ankle plantarflexion      Ankle inversion      Ankle eversion       (Blank rows = not tested)  LUMBAR SPECIAL TESTS:  SLR positive on right  FUNCTIONAL TESTS:  Sit to stand: patient will difficulty standing from standard chair, requires UE for support with transferring from sit to stand  GAIT: Assistive device utilized: None Level of assistance: Complete Independence Comments: Antalgic on right   TREATMENT [facility] Adult PT Treatment:                                                DATE: 07/24/2023 Recumbent bike L1 x 5 min to improve endurance and workload capacity KT tape for right knee, improving proprioceptive control of patella Sit to stand 3 x 5 LAQ with 2# 3 x 10 each Standing hip abduction and extension at counter 2 x 10 each Seated hamstring curl with yellow x 10 each   PATIENT EDUCATION:  Education details: HEP, taping for right knee Person educated: Patient Education method: Explanation, Demonstration, Tactile cues, Verbal cues Education comprehension: verbalized understanding, returned demonstration, verbal cues required, tactile cues  required, and needs further education  HOME EXERCISE PROGRAM: Access Code: [email or web address]    ASSESSMENT: CLINICAL IMPRESSION: Patient tolerated therapy well with no adverse effects. She arrives reporting sharp pains in her right knee around the knee cap, so trial of KT tape for patellar control applied with patient reporting good therapeutic befit. Therapy continued focus on progressing primarily hip and knee strengthening this visit. She does report feeling like the right leg is heavy while performing exercises and will tend to internally rotate at the right hip when performing seated knee strengthening on the right. No changes made to HEP this visit. Patient was on the phone speaking with her son's school for a short period during the session so not billed during that time. Patient would benefit from continued skilled PT to progress mobility and strength in order to reduce pain and maximize functional ability.   Eval: Patient is a 48 y.o. female who was seen today for physical therapy evaluation and treatment for chronic lower back and right knee pain. Her symptoms do seem consistent with nonspecific lower back pain and right sided lumbar radiculopathy. Evaluation was limited this visit due to patient's high symptom severity and irritability level. She did become tearful during the evaluation. Trial of e-stim IFC combined with cold pack with patient reporting pain level reduction, and provided patient with information for purchasing home TENS unit for pain relief. Unable to provide patient with HEP this visit due to inability to complete exercise without severe pain. Patient was provided extensive education on possible etiologies of her symptoms and treatments that could be  utilized in therapy, and gradual progression for return to activity.   OBJECTIVE IMPAIRMENTS: Abnormal gait, decreased activity tolerance, decreased balance, decreased ROM, decreased strength, impaired flexibility, postural dysfunction,  and pain.   ACTIVITY LIMITATIONS: carrying, lifting, bending, sitting, standing, squatting, sleeping, stairs, transfers, bed mobility, locomotion level, and caring for others  PARTICIPATION LIMITATIONS: meal prep, cleaning, laundry, driving, shopping, and community activity  PERSONAL FACTORS: Fitness, Past/current experiences, Time since onset of injury/illness/exacerbation, and 3+ comorbidities: see PMH above  are also affecting patient's functional outcome.    GOALS: Goals reviewed with patient? Yes  SHORT TERM GOALS: Target date: 07/31/2023  Patient will be I with initial HEP in order to progress with therapy. Baseline: None provided at eval Goal status: INITIAL  2.  Patient will report low back and right leg pain </= 7/10 at worst to reduce functional limitations Baseline: 10/10 pain at worst Goal status: INITIAL  3.  Patient will be able to perform sit to stand without difficulty or using UE for support to improve mobility and indicate improvement in LE strength Baseline: requires UE for support with standing from chair Goal status: INITIAL  LONG TERM GOALS: Target date: 08/28/2023  Patient will be I with final HEP to maintain progress from PT. Baseline: None provided at eval Goal status: INITIAL  2.  Patient will report Modified Oswestry </= 25/50 (50% disability) in order to indicate an improvement in their functional status Baseline: 37/50 (74% disability) Goal status: INITIAL  3.  Patient will demonstrate improvement in core and hip strength grossly >/= 4-/5 MMT in order to improve her activity tolerance with household tasks Baseline: see limitations above Goal status: INITIAL  4.  Patient will demonstrate right knee strength >/= 4+/5 MMT in order to improve stair negotiation and reduce fall risk due to right knee giving out Baseline: see limitations above Goal status: INITIAL  5. Patient will report lower back and right leg pain </= 4/10 in order to reduce  functional limitations  Baseline: 10/10 at worst  Goal status: INITIAL   PLAN: PT FREQUENCY: 1-2x/week  PT DURATION: 8 weeks  PLANNED INTERVENTIONS: 97164- PT Re-evaluation, 97110-Therapeutic exercises, 97530- Therapeutic activity, 97112- Neuromuscular re-education, 97535- Self Care, 16109- Manual therapy, [id number]- Electrical stimulation (unattended), [phone]- Electrical stimulation (manual), Patient/Family education, Balance training, Taping, Dry Needling, Joint mobilization, Joint manipulation, Spinal manipulation, Spinal mobilization, Cryotherapy, and Moist heat.  PLAN FOR NEXT SESSION: Review HEP and progress PRN, continue with modalities for pain relief as needed, manual/TPDN for right lumbar and gluteal region, trial LAD, initiate gentle stretching and mobility for lumbar, initiate gentle core activation, hip and LE strengthening as tolerated    Leah Primus, PT, DPT, LAT, ATC 07/24/23  9:44 AM Phone: [phone] Fax: [phone]

## 2023-07-29 ENCOUNTER — Inpatient Hospital Stay: Payer: Medicare Other

## 2023-07-29 ENCOUNTER — Ambulatory Visit (INDEPENDENT_AMBULATORY_CARE_PROVIDER_SITE_OTHER): Admitting: Physical Therapy

## 2023-07-29 ENCOUNTER — Encounter: Payer: Self-pay | Admitting: Physical Therapy

## 2023-07-29 ENCOUNTER — Other Ambulatory Visit: Payer: Self-pay

## 2023-07-29 VITALS — BP 208/106 | HR 91 | Temp 98.8°F | Resp 18

## 2023-07-29 DIAGNOSIS — M5459 Other low back pain: Secondary | ICD-10-CM | POA: Diagnosis not present

## 2023-07-29 DIAGNOSIS — M79604 Pain in right leg: Secondary | ICD-10-CM

## 2023-07-29 DIAGNOSIS — C50412 Malignant neoplasm of upper-outer quadrant of left female breast: Secondary | ICD-10-CM

## 2023-07-29 DIAGNOSIS — M6281 Muscle weakness (generalized): Secondary | ICD-10-CM

## 2023-07-29 DIAGNOSIS — Z5111 Encounter for antineoplastic chemotherapy: Secondary | ICD-10-CM | POA: Diagnosis not present

## 2023-07-29 MED ORDER — GOSERELIN ACETATE 3.6 MG ~~LOC~~ IMPL
3.6000 mg | DRUG_IMPLANT | SUBCUTANEOUS | Status: DC
  Administered 2023-07-29: 3.6 mg via SUBCUTANEOUS
  Filled 2023-07-29: qty 3.6

## 2023-07-29 NOTE — Therapy (Signed)
 OUTPATIENT PHYSICAL THERAPY TREATMENT   Patient Name: Erica [name] MRN: [id number] DOB:1975/11/13, 48 y.o., female Today's Date: 07/29/2023   END OF SESSION:  PT End of Session - 07/29/23 0859     Visit Number 8    Number of Visits 17    Date for PT Re-Evaluation 08/28/23    Authorization Type [employer] MCR    Progress Note Due on Visit 10    PT Start Time 0855    PT Stop Time 0933    PT Time Calculation (min) 38 min    Activity Tolerance Patient tolerated treatment well    Behavior During Therapy [facility] for tasks assessed/performed                    Past Medical History:  Diagnosis Date   Allergy     Anxiety    Apnea, sleep 05/04/2014   AHI 31/hr now on CPAP at 12cm H2O   Arthritis    Asthma    Asthma without status asthmaticus 01/19/2016   Axillary hidradenitis suppurativa 08/01/2011   Blood transfusion without reported diagnosis    Breast cancer (HCC) 2010   a. locally advanced left breast carcinoma diagnosed in 2010 and treated with neoadjuvant chemotherapy with docetaxel and cyclophosphamide as well as paclitaxel. This was followed by radiation therapy which was completed in 2011.   Chemotherapy-induced peripheral neuropathy (HCC) 12/09/2016   CHF (congestive heart failure) (HCC)    Chronic diastolic CHF (congestive heart failure) (HCC) 06/23/2013   Chronic kidney disease    Chronic pain in left foot 06/29/2018   Chronic stable angina (HCC) 08/22/2020   Cough variant asthma 09/27/2010   Followed in Pulmonary clinic/ [facility]/ [place]  Onset reported in childhood  - PFT's 01/31/2011 FEV1  1.76 (60% ) ratio 68 and no better p B2,  DLCO 70% -med review 09/25/2012 > [place]  drug assistance  paperwork given    - HFA 90% 11/20/2012 .  -Med calendar 12/07/12 , 04/06/2013 , 04/18/2014,, 08/30/2014 , 11/08/2014  - PFT's 01/12/2013  1.73 (65%)  Ratio 73 and no better p B2, DLCO 86% - trial off    Depression with somatization 06/15/2015   Dyspnea on exertion    a. 01/2013 Lexi  MV: EF 54%, no ischemia/infarct;  b. 05/2013 Echo: EF 60-65%, no rwma, Gr 2 DD;  b.    Essential hypertension 10/09/2014   Estimated Creatinine Clearance: 82.4 mL/min (by C-G formula based on SCr of 0.98 mg/dL).   Fatty liver    Gastroparesis    GERD (gastroesophageal reflux disease)    Hyperglycemia 04/13/2011   Hypertension    Hypertensive heart disease 09/26/2015   Hypertriglyceridemia without hypercholesterolemia 06/15/2015   Framingham risk score is 1%    Idiopathic intracranial hypertension 10/21/2014   Impaired glucose tolerance 04/13/2011   Insomnia secondary to anxiety 04/05/2015   Lymphadenitis, chronic    restricted LEFT extremity   Malignant neoplasm of upper-outer quadrant of left breast in female, estrogen receptor negative (HCC) 01/07/2013   Stage III, Invasive Ductal, s/p partial Left Mastectomy/Lumpectomy (baseball sized) with Lymph Node dissection, had Chemo and RadRx     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   OSA (obstructive sleep apnea) 05/04/2014   AHI 31/hr now on CPAP at 12cm H2O   Oxygen deficiency    Uses 02 in CPAP   Persistent proteinuria 03/31/2018   Personal history of chemotherapy 2010   Personal history of radiation therapy 2010   Resistant hypertension 09/17/2011  Sleep apnea    SVT (supraventricular tachycardia) (HCC) 08/22/2020   Tibialis posterior tendinitis, left 07/15/2018   Injected in March 10, 2019   Ulcer    Upper airway cough syndrome 10/09/2014   Onset around 2012 with assoc pseudowheeze MRI 05/18/15 > Paranasal sinuses are clear - 11/17/2015 rec increase h1 to 2 hs to help with noct cough  - Allergy  profile 11/17/2015 >  Eos 0.1 /  IgE  80 POS RAST  to grass, ragweed, cedar tree  - d/c acei 03/06/2018  - flare end of Jan 2020 in setting of ? Uri/ poorly controlled GERD - flare mid Oct 2020 with uri / assoc secondary gerd   Vitamin D  deficiency disease 03/31/2018   Past Surgical History:  Procedure Laterality Date   BRAIN SURGERY      Shunt   BREAST LUMPECTOMY Left 09/2008   CARPAL TUNNEL RELEASE Left 2011   LYMPH NODE DISSECTION Left 2010   breast; 2 wks after breast lumpectomy   RENAL DENERVATION N/A 11/20/2022   Procedure: RENAL DENERVATION;  Surgeon: Wenona Hamilton, MD;  Location: [facility] INVASIVE CV LAB;  Service: Cardiovascular;  Laterality: N/A;   RIGHT/LEFT HEART CATH AND CORONARY ANGIOGRAPHY N/A 05/04/2019   Procedure: RIGHT/LEFT HEART CATH AND CORONARY ANGIOGRAPHY;  Surgeon: Mardell Shade, MD;  Location: [facility] INVASIVE CV LAB;  Service: Cardiovascular;  Laterality: N/A;  Renal injection preformed    RIGHT/LEFT HEART CATH AND CORONARY ANGIOGRAPHY N/A 10/25/2020   Procedure: RIGHT/LEFT HEART CATH AND CORONARY ANGIOGRAPHY;  Surgeon: Arleen Lacer, MD;  Location: [facility] INVASIVE CV LAB;  Service: Cardiovascular;  Laterality: N/A;   TENDON RELEASE Right 2012   "hand"   TUBAL LIGATION  05/11/2011   Procedure: POST PARTUM TUBAL LIGATION;  Surgeon: Onnie Bilis, MD;  Location: [facility];  Service: Gynecology;  Laterality: Bilateral;  Induced for HTN   Patient Active Problem List   Diagnosis Date Noted   Prediabetes 06/13/2023   Moderate episode of recurrent major depressive disorder (HCC) 02/04/2023   GAD (generalized anxiety disorder) 02/04/2023   Chronic respiratory failure with hypoxia (HCC) 12/31/2021   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   Coronary artery disease    Chronic stable angina (HCC) 08/22/2020   SVT (supraventricular tachycardia) (HCC) 08/22/2020   Palpitations 02/28/2020   Right ankle pain 04/14/2019   OSA (obstructive sleep apnea) 10/01/2018   Tibialis posterior tendinitis, left 07/15/2018   Chronic pain in left foot 06/29/2018   Vitamin D  deficiency disease 03/31/2018   Persistent proteinuria 03/31/2018   Hyperlipidemia LDL goal <100 03/30/2018   Chemotherapy-induced peripheral neuropathy (HCC) 12/09/2016   Asthma in adult, mild persistent, uncomplicated 01/19/2016   Hypertensive heart disease 09/26/2015    Depression with somatization 06/15/2015   Hypertriglyceridemia without hypercholesterolemia 06/15/2015   Hypokalemia 06/15/2015   Insomnia secondary to anxiety 04/05/2015   Idiopathic intracranial hypertension 10/21/2014   Upper airway cough syndrome 10/09/2014   Malignant hypertension 10/09/2014   Morbid obesity due to excess calories (HCC) 10/09/2014   Apnea, sleep 05/04/2014   Acute on chronic diastolic CHF (congestive heart failure), NYHA class 2 (HCC) 05/03/2014   DOE (dyspnea on exertion) 07/14/2013   Acute on chronic diastolic heart failure (HCC) 06/23/2013   Malignant neoplasm of upper-outer quadrant of left breast in female, estrogen receptor negative (HCC) 01/07/2013   Gastroparesis 01/04/2013   Resistant hypertension 09/17/2011   Axillary hidradenitis suppurativa 08/01/2011   Preventative health care 04/13/2011   Hyperglycemia 04/13/2011   GERD (gastroesophageal reflux disease) 03/18/2011  Cough variant asthma 09/27/2010    PCP: Nonda Bays, FNP  REFERRING PROVIDER: Syliva Even, MD  REFERRING DIAG: Lumbar radiculopathy  Rationale for Evaluation and Treatment: Rehabilitation  THERAPY DIAG:  Other low back pain  Pain in right leg  Muscle weakness (generalized)  ONSET DATE: August 2024   SUBJECTIVE:     SUBJECTIVE STATEMENT: Patient reports she was late because he appointment at the cancer center went long. She states the tape was helpful after last visit. She did go to [employer] and states her right knee started to give out on her but she did not fall. She is reporting majority of her pain is located to the back and outside of the knee, but she also gets lightning shooting pain in the front of the knee.  Eval: Patients reports she had renal denervation done in August 2024 and has been in pain ever since. Pain is located across the lower back, she was told it would hurt for 3-4 months but pain persisted in the lower back. The right knee pain started after  in October or November, and it will keep her up at night. She states it feels heavy in her lower back, like labor pain that never stops. The pain will start in the right lower back region and then will feel like lightning shooting down the right leg to the back and outside of the right knee, pain does not go past the knee. She leans on the bed for relief, and she states getting on her stomach gives her the most relief, but she has a lot of pain when getting up. She does use a heating pad as well. She reports that to help with the knee pain at night she has to have her leg up on the wall. She states she moves around a lot because sitting or standing for long periods will make it worse. Will occasionally have trouble bending down and she states the right leg has given out on her causing her to fall. She does have trouble going up/down stairs, she turns to the side when going down, and she also has breathing issues so when she goes up stairs to get inside she has to immediately get on oxygen. She reports inability to play with or lift her grandchildren and difficulty completing activities around her home for her son.  PERTINENT HISTORY:  See PMH above  PAIN:  Are you having pain? Yes:  NPRS scale: 5/10 currently, 10/10 at worst Pain location: Lower back and right leg/knee Pain description: Heavy, labor pains, constant Aggravating factors: Standing, sitting, activity Relieving factors: Lying on stomach, leaning on bed or counter, heat  PRECAUTIONS: Fall, patient reports she was told not to lift more than 10 lbs  FALLS:  Has patient fallen in last 6 months? Yes. Number of falls 3-4, right knee gives out or sharp pains could cause her to fall  PATIENT GOALS: Pain relief, get back to doing normal things   OBJECTIVE:  Note: Objective measures were completed at Evaluation unless otherwise noted. PATIENT SURVEYS:  Modified Oswestry 37/50 (74% disability)   SENSATION: Patient reports neuropathy of  feet and hands  MUSCLE LENGTH: Unable to fully assess, severe increase in right lower back and leg pain when attempting to assess hamstring length  POSTURE:  Rounded shoulder posture, normal lumbar lordosis, weight shift toward left and patient frequently changing positions while seated for comfort  PALPATION: Exquisite tenderness to right lower lumbar paraspinals, right gluteal and TFL region, right  lateral thigh and knee; moderate tenderness to left lumbar paraspinals  Unable to assess lumbar mobility  LUMBAR ROM:   AROM eval  Flexion   Extension   Right lateral flexion   Left lateral flexion   Right rotation   Left rotation    (Blank rows = not tested)  LOWER EXTREMITY ROM:    Limitations in right hip ROM due to pain and guarding, right knee flexion limited due to pain  LOWER EXTREMITY MMT:    MMT Right eval Left eval Rt / Lt 07/08/2023 Right 07/22/2023  Hip flexion 3+ 4    Hip extension 2 3- 3 / 3   Hip abduction 2     Hip adduction      Hip internal rotation      Hip external rotation      Knee flexion 4- 4  4  Knee extension 4- 4+  4  Ankle dorsiflexion      Ankle plantarflexion      Ankle inversion      Ankle eversion       (Blank rows = not tested)  LUMBAR SPECIAL TESTS:  SLR positive on right  FUNCTIONAL TESTS:  Sit to stand: patient will difficulty standing from standard chair, requires UE for support with transferring from sit to stand  GAIT: Assistive device utilized: None Level of assistance: Complete Independence Comments: Antalgic on right   TREATMENT [facility] Adult PT Treatment:                                                DATE: 07/29/2023 Recumbent bike L1 x 5 min to improve endurance and workload capacity KT tape for right knee, improving proprioceptive control of patella Sit to stand 2 x 10 Standing TKE with yellow 2 x 10 LAQ 2# 2 x 10 on right   PATIENT EDUCATION:  Education details: HEP, taping for right knee Person educated:  Patient Education method: Explanation, Demonstration, Tactile cues, Verbal cues Education comprehension: verbalized understanding, returned demonstration, verbal cues required, tactile cues required, and needs further education  HOME EXERCISE PROGRAM: Access Code: [email or web address]    ASSESSMENT: CLINICAL IMPRESSION: Patient tolerated therapy well with no adverse effects. Continued with KT taping for the right knee to improve proprioceptive patellar control as patient reports this was helpful following last visit. Therapy focused primarily on progressing her strength and quad control. She was able to tolerate performing standing banded TKE but did exhibit difficulty and shaking at end range knee extension. She did report some burning pain that seems to be lateral hamstring vs ITB related. No changes made to her HEP this visit. Patient would benefit from continued skilled PT to progress mobility and strength in order to reduce pain and maximize functional ability.   Eval: Patient is a 48 y.o. female who was seen today for physical therapy evaluation and treatment for chronic lower back and right knee pain. Her symptoms do seem consistent with nonspecific lower back pain and right sided lumbar radiculopathy. Evaluation was limited this visit due to patient's high symptom severity and irritability level. She did become tearful during the evaluation. Trial of e-stim IFC combined with cold pack with patient reporting pain level reduction, and provided patient with information for purchasing home TENS unit for pain relief. Unable to provide patient with HEP this visit due to inability to complete exercise without severe  pain. Patient was provided extensive education on possible etiologies of her symptoms and treatments that could be utilized in therapy, and gradual progression for return to activity.   OBJECTIVE IMPAIRMENTS: Abnormal gait, decreased activity tolerance, decreased balance, decreased ROM, decreased  strength, impaired flexibility, postural dysfunction, and pain.   ACTIVITY LIMITATIONS: carrying, lifting, bending, sitting, standing, squatting, sleeping, stairs, transfers, bed mobility, locomotion level, and caring for others  PARTICIPATION LIMITATIONS: meal prep, cleaning, laundry, driving, shopping, and community activity  PERSONAL FACTORS: Fitness, Past/current experiences, Time since onset of injury/illness/exacerbation, and 3+ comorbidities: see PMH above  are also affecting patient's functional outcome.    GOALS: Goals reviewed with patient? Yes  SHORT TERM GOALS: Target date: 07/31/2023  Patient will be I with initial HEP in order to progress with therapy. Baseline: None provided at eval 07/28/2023: patient reports independence with HEP Goal status: MET  2.  Patient will report low back and right leg pain </= 7/10 at worst to reduce functional limitations Baseline: 10/10 pain at worst 07/29/2023: reports pain at worst still 10/10 Goal status: ONGOING  3.  Patient will be able to perform sit to stand without difficulty or using UE for support to improve mobility and indicate improvement in LE strength Baseline: requires UE for support with standing from chair 07/29/2023: able to perform sit to stand without UE support but exhibits difficulty  Goal status: ONGOING  LONG TERM GOALS: Target date: 08/28/2023  Patient will be I with final HEP to maintain progress from PT. Baseline: None provided at eval Goal status: INITIAL  2.  Patient will report Modified Oswestry </= 25/50 (50% disability) in order to indicate an improvement in their functional status Baseline: 37/50 (74% disability) Goal status: INITIAL  3.  Patient will demonstrate improvement in core and hip strength grossly >/= 4-/5 MMT in order to improve her activity tolerance with household tasks Baseline: see limitations above Goal status: INITIAL  4.  Patient will demonstrate right knee strength >/= 4+/5 MMT in  order to improve stair negotiation and reduce fall risk due to right knee giving out Baseline: see limitations above Goal status: INITIAL  5. Patient will report lower back and right leg pain </= 4/10 in order to reduce functional limitations  Baseline: 10/10 at worst  Goal status: INITIAL   PLAN: PT FREQUENCY: 1-2x/week  PT DURATION: 8 weeks  PLANNED INTERVENTIONS: 97164- PT Re-evaluation, 97110-Therapeutic exercises, 97530- Therapeutic activity, 97112- Neuromuscular re-education, 97535- Self Care, 13244- Manual therapy, [id number]- Electrical stimulation (unattended), [id number]- Electrical stimulation (manual), Patient/Family education, Balance training, Taping, Dry Needling, Joint mobilization, Joint manipulation, Spinal manipulation, Spinal mobilization, Cryotherapy, and Moist heat.  PLAN FOR NEXT SESSION: Review HEP and progress PRN, continue with modalities for pain relief as needed, manual/TPDN for right lumbar and gluteal region, trial LAD, initiate gentle stretching and mobility for lumbar, initiate gentle core activation, hip and LE strengthening as tolerated    Leah Primus, PT, DPT, LAT, ATC 07/29/23  9:33 AM Phone: [phone] Fax: [phone]

## 2023-07-31 ENCOUNTER — Encounter: Payer: Self-pay | Admitting: Physical Therapy

## 2023-07-31 ENCOUNTER — Ambulatory Visit: Admitting: Physical Therapy

## 2023-07-31 ENCOUNTER — Other Ambulatory Visit: Payer: Self-pay

## 2023-07-31 DIAGNOSIS — M6281 Muscle weakness (generalized): Secondary | ICD-10-CM | POA: Diagnosis not present

## 2023-07-31 DIAGNOSIS — M79604 Pain in right leg: Secondary | ICD-10-CM

## 2023-07-31 DIAGNOSIS — M5459 Other low back pain: Secondary | ICD-10-CM | POA: Diagnosis not present

## 2023-07-31 NOTE — Therapy (Signed)
 OUTPATIENT PHYSICAL THERAPY TREATMENT   Patient Name: Erica [name] MRN: [id number] DOB:03/19/1976, 48 y.o., female Today's Date: 07/31/2023   END OF SESSION:  PT End of Session - 07/31/23 0851     Visit Number 9    Number of Visits 17    Date for PT Re-Evaluation 08/28/23    Authorization Type [employer] MCR    Progress Note Due on Visit 10    PT Start Time 0848    PT Stop Time 0930    PT Time Calculation (min) 42 min    Activity Tolerance Patient tolerated treatment well    Behavior During Therapy [facility] for tasks assessed/performed                     Past Medical History:  Diagnosis Date   Allergy     Anxiety    Apnea, sleep 05/04/2014   AHI 31/hr now on CPAP at 12cm H2O   Arthritis    Asthma    Asthma without status asthmaticus 01/19/2016   Axillary hidradenitis suppurativa 08/01/2011   Blood transfusion without reported diagnosis    Breast cancer (HCC) 2010   a. locally advanced left breast carcinoma diagnosed in 2010 and treated with neoadjuvant chemotherapy with docetaxel and cyclophosphamide as well as paclitaxel. This was followed by radiation therapy which was completed in 2011.   Chemotherapy-induced peripheral neuropathy (HCC) 12/09/2016   CHF (congestive heart failure) (HCC)    Chronic diastolic CHF (congestive heart failure) (HCC) 06/23/2013   Chronic kidney disease    Chronic pain in left foot 06/29/2018   Chronic stable angina (HCC) 08/22/2020   Cough variant asthma 09/27/2010   Followed in Pulmonary clinic/ [facility]/ [place]  Onset reported in childhood  - PFT's 01/31/2011 FEV1  1.76 (60% ) ratio 68 and no better p B2,  DLCO 70% -med review 09/25/2012 > [place]  drug assistance  paperwork given    - HFA 90% 11/20/2012 .  -Med calendar 12/07/12 , 04/06/2013 , 04/18/2014,, 08/30/2014 , 11/08/2014  - PFT's 01/12/2013  1.73 (65%)  Ratio 73 and no better p B2, DLCO 86% - trial off    Depression with somatization 06/15/2015   Dyspnea on exertion    a. 01/2013  Lexi MV: EF 54%, no ischemia/infarct;  b. 05/2013 Echo: EF 60-65%, no rwma, Gr 2 DD;  b.    Essential hypertension 10/09/2014   Estimated Creatinine Clearance: 82.4 mL/min (by C-G formula based on SCr of 0.98 mg/dL).   Fatty liver    Gastroparesis    GERD (gastroesophageal reflux disease)    Hyperglycemia 04/13/2011   Hypertension    Hypertensive heart disease 09/26/2015   Hypertriglyceridemia without hypercholesterolemia 06/15/2015   Framingham risk score is 1%    Idiopathic intracranial hypertension 10/21/2014   Impaired glucose tolerance 04/13/2011   Insomnia secondary to anxiety 04/05/2015   Lymphadenitis, chronic    restricted LEFT extremity   Malignant neoplasm of upper-outer quadrant of left breast in female, estrogen receptor negative (HCC) 01/07/2013   Stage III, Invasive Ductal, s/p partial Left Mastectomy/Lumpectomy (baseball sized) with Lymph Node dissection, had Chemo and RadRx     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   OSA (obstructive sleep apnea) 05/04/2014   AHI 31/hr now on CPAP at 12cm H2O   Oxygen deficiency    Uses 02 in CPAP   Persistent proteinuria 03/31/2018   Personal history of chemotherapy 2010   Personal history of radiation therapy 2010   Resistant hypertension 09/17/2011  Sleep apnea    SVT (supraventricular tachycardia) (HCC) 08/22/2020   Tibialis posterior tendinitis, left 07/15/2018   Injected in March 10, 2019   Ulcer    Upper airway cough syndrome 10/09/2014   Onset around 2012 with assoc pseudowheeze MRI 05/18/15 > Paranasal sinuses are clear - 11/17/2015 rec increase h1 to 2 hs to help with noct cough  - Allergy  profile 11/17/2015 >  Eos 0.1 /  IgE  80 POS RAST  to grass, ragweed, cedar tree  - d/c acei 03/06/2018  - flare end of Jan 2020 in setting of ? Uri/ poorly controlled GERD - flare mid Oct 2020 with uri / assoc secondary gerd   Vitamin D  deficiency disease 03/31/2018   Past Surgical History:  Procedure Laterality Date   BRAIN  SURGERY     Shunt   BREAST LUMPECTOMY Left 09/2008   CARPAL TUNNEL RELEASE Left 2011   LYMPH NODE DISSECTION Left 2010   breast; 2 wks after breast lumpectomy   RENAL DENERVATION N/A 11/20/2022   Procedure: RENAL DENERVATION;  Surgeon: Wenona Hamilton, MD;  Location: [facility] INVASIVE CV LAB;  Service: Cardiovascular;  Laterality: N/A;   RIGHT/LEFT HEART CATH AND CORONARY ANGIOGRAPHY N/A 05/04/2019   Procedure: RIGHT/LEFT HEART CATH AND CORONARY ANGIOGRAPHY;  Surgeon: Mardell Shade, MD;  Location: [facility] INVASIVE CV LAB;  Service: Cardiovascular;  Laterality: N/A;  Renal injection preformed    RIGHT/LEFT HEART CATH AND CORONARY ANGIOGRAPHY N/A 10/25/2020   Procedure: RIGHT/LEFT HEART CATH AND CORONARY ANGIOGRAPHY;  Surgeon: Arleen Lacer, MD;  Location: [facility] INVASIVE CV LAB;  Service: Cardiovascular;  Laterality: N/A;   TENDON RELEASE Right 2012   "hand"   TUBAL LIGATION  05/11/2011   Procedure: POST PARTUM TUBAL LIGATION;  Surgeon: Onnie Bilis, MD;  Location: [facility];  Service: Gynecology;  Laterality: Bilateral;  Induced for HTN   Patient Active Problem List   Diagnosis Date Noted   Prediabetes 06/13/2023   Moderate episode of recurrent major depressive disorder (HCC) 02/04/2023   GAD (generalized anxiety disorder) 02/04/2023   Chronic respiratory failure with hypoxia (HCC) 12/31/2021   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   Coronary artery disease    Chronic stable angina (HCC) 08/22/2020   SVT (supraventricular tachycardia) (HCC) 08/22/2020   Palpitations 02/28/2020   Right ankle pain 04/14/2019   OSA (obstructive sleep apnea) 10/01/2018   Tibialis posterior tendinitis, left 07/15/2018   Chronic pain in left foot 06/29/2018   Vitamin D  deficiency disease 03/31/2018   Persistent proteinuria 03/31/2018   Hyperlipidemia LDL goal <100 03/30/2018   Chemotherapy-induced peripheral neuropathy (HCC) 12/09/2016   Asthma in adult, mild persistent, uncomplicated 01/19/2016   Hypertensive heart disease  09/26/2015   Depression with somatization 06/15/2015   Hypertriglyceridemia without hypercholesterolemia 06/15/2015   Hypokalemia 06/15/2015   Insomnia secondary to anxiety 04/05/2015   Idiopathic intracranial hypertension 10/21/2014   Upper airway cough syndrome 10/09/2014   Malignant hypertension 10/09/2014   Morbid obesity due to excess calories (HCC) 10/09/2014   Apnea, sleep 05/04/2014   Acute on chronic diastolic CHF (congestive heart failure), NYHA class 2 (HCC) 05/03/2014   DOE (dyspnea on exertion) 07/14/2013   Acute on chronic diastolic heart failure (HCC) 06/23/2013   Malignant neoplasm of upper-outer quadrant of left breast in female, estrogen receptor negative (HCC) 01/07/2013   Gastroparesis 01/04/2013   Resistant hypertension 09/17/2011   Axillary hidradenitis suppurativa 08/01/2011   Preventative health care 04/13/2011   Hyperglycemia 04/13/2011   GERD (gastroesophageal reflux disease) 03/18/2011  Cough variant asthma 09/27/2010    PCP: Nonda Bays, FNP  REFERRING PROVIDER: Syliva Even, MD  REFERRING DIAG: Lumbar radiculopathy  Rationale for Evaluation and Treatment: Rehabilitation  THERAPY DIAG:  Other low back pain  Pain in right leg  Muscle weakness (generalized)  ONSET DATE: August 2024   SUBJECTIVE:     SUBJECTIVE STATEMENT: Patient reports she was late because he appointment at the cancer center went long. She states the tape was helpful after last visit. She did go to [employer] and states her right knee started to give out on her but she did not fall. She is reporting majority of her pain is located to the back and outside of the knee, but she also gets lightning shooting pain in the front of the knee.  Eval: Patients reports she had renal denervation done in August 2024 and has been in pain ever since. Pain is located across the lower back, she was told it would hurt for 3-4 months but pain persisted in the lower back. The right knee pain  started after in October or November, and it will keep her up at night. She states it feels heavy in her lower back, like labor pain that never stops. The pain will start in the right lower back region and then will feel like lightning shooting down the right leg to the back and outside of the right knee, pain does not go past the knee. She leans on the bed for relief, and she states getting on her stomach gives her the most relief, but she has a lot of pain when getting up. She does use a heating pad as well. She reports that to help with the knee pain at night she has to have her leg up on the wall. She states she moves around a lot because sitting or standing for long periods will make it worse. Will occasionally have trouble bending down and she states the right leg has given out on her causing her to fall. She does have trouble going up/down stairs, she turns to the side when going down, and she also has breathing issues so when she goes up stairs to get inside she has to immediately get on oxygen. She reports inability to play with or lift her grandchildren and difficulty completing activities around her home for her son.  PERTINENT HISTORY:  See PMH above  PAIN:  Are you having pain? Yes:  NPRS scale: 5/10 currently, 10/10 at worst Pain location: Lower back and right leg/knee Pain description: Heavy, labor pains, constant Aggravating factors: Standing, sitting, activity Relieving factors: Lying on stomach, leaning on bed or counter, heat  PRECAUTIONS: Fall, patient reports she was told not to lift more than 10 lbs  FALLS:  Has patient fallen in last 6 months? Yes. Number of falls 3-4, right knee gives out or sharp pains could cause her to fall  PATIENT GOALS: Pain relief, get back to doing normal things   OBJECTIVE:  Note: Objective measures were completed at Evaluation unless otherwise noted. PATIENT SURVEYS:  Modified Oswestry 37/50 (74% disability)   SENSATION: Patient reports  neuropathy of feet and hands  MUSCLE LENGTH: Unable to fully assess, severe increase in right lower back and leg pain when attempting to assess hamstring length  POSTURE:  Rounded shoulder posture, normal lumbar lordosis, weight shift toward left and patient frequently changing positions while seated for comfort  PALPATION: Exquisite tenderness to right lower lumbar paraspinals, right gluteal and TFL region, right  lateral thigh and knee; moderate tenderness to left lumbar paraspinals  Unable to assess lumbar mobility  LUMBAR ROM:   AROM eval  Flexion   Extension   Right lateral flexion   Left lateral flexion   Right rotation   Left rotation    (Blank rows = not tested)  LOWER EXTREMITY ROM:    Limitations in right hip ROM due to pain and guarding, right knee flexion limited due to pain  LOWER EXTREMITY MMT:    MMT Right eval Left eval Rt / Lt 07/08/2023 Right 07/22/2023 Right 07/31/2023  Hip flexion 3+ 4     Hip extension 2 3- 3 / 3    Hip abduction 2      Hip adduction       Hip internal rotation       Hip external rotation       Knee flexion 4- 4  4   Knee extension 4- 4+  4 4  Ankle dorsiflexion       Ankle plantarflexion       Ankle inversion       Ankle eversion        (Blank rows = not tested)  LUMBAR SPECIAL TESTS:  SLR positive on right  FUNCTIONAL TESTS:  Sit to stand: patient will difficulty standing from standard chair, requires UE for support with transferring from sit to stand  GAIT: Assistive device utilized: None Level of assistance: Complete Independence Comments: Antalgic on right   TREATMENT [facility] Adult PT Treatment:                                                DATE: 07/31/2023 Recumbent bike L1 x 5 min to improve endurance and workload capacity KT tape for right knee, improving proprioceptive control of patella Sit to stand 2 x 10 Seated hamstring stretch 3 x 20 sec Seated clamshell with red 2 x 10 Seated stability ball roll out for  lumbar stretch 10 x 5 sec Seated abdominal set with stability ball press 10 x 5 sec LAQ 2# 2 x 10 on right Seated pallof press with yellow x 10 each Standing TKE with yellow 2 x 10 on right   PATIENT EDUCATION:  Education details: HEP, continued taping for right knee Person educated: Patient Education method: Explanation, Demonstration, Tactile cues, Verbal cues Education comprehension: verbalized understanding, returned demonstration, verbal cues required, tactile cues required, and needs further education  HOME EXERCISE PROGRAM: Access Code: [email or web address]    ASSESSMENT: CLINICAL IMPRESSION: Patient tolerated therapy well with no adverse effects. Continued with KT taping for the right knee to improve proprioceptive patellar control as patient reports this continues to be helpful. Therapy focused on progressing core activation and lumbar stabilization, hip and knee strengthening with fair tolerance. She reports increased pain of the outside of the right knee this visit, and exhibits greater difficulty with knee strengthening. She was able to progress slightly with core exercises incorporating and seated pallof press. She continues to report weakness of the right leg compared to the left. No changes made to her HEP this visit. Patient would benefit from continued skilled PT to progress mobility and strength in order to reduce pain and maximize functional ability.   Eval: Patient is a 48 y.o. female who was seen today for physical therapy evaluation and treatment for chronic lower back and right knee pain.  Her symptoms do seem consistent with nonspecific lower back pain and right sided lumbar radiculopathy. Evaluation was limited this visit due to patient's high symptom severity and irritability level. She did become tearful during the evaluation. Trial of e-stim IFC combined with cold pack with patient reporting pain level reduction, and provided patient with information for purchasing home TENS unit  for pain relief. Unable to provide patient with HEP this visit due to inability to complete exercise without severe pain. Patient was provided extensive education on possible etiologies of her symptoms and treatments that could be utilized in therapy, and gradual progression for return to activity.   OBJECTIVE IMPAIRMENTS: Abnormal gait, decreased activity tolerance, decreased balance, decreased ROM, decreased strength, impaired flexibility, postural dysfunction, and pain.   ACTIVITY LIMITATIONS: carrying, lifting, bending, sitting, standing, squatting, sleeping, stairs, transfers, bed mobility, locomotion level, and caring for others  PARTICIPATION LIMITATIONS: meal prep, cleaning, laundry, driving, shopping, and community activity  PERSONAL FACTORS: Fitness, Past/current experiences, Time since onset of injury/illness/exacerbation, and 3+ comorbidities: see PMH above  are also affecting patient's functional outcome.    GOALS: Goals reviewed with patient? Yes  SHORT TERM GOALS: Target date: 07/31/2023  Patient will be I with initial HEP in order to progress with therapy. Baseline: None provided at eval 07/28/2023: patient reports independence with HEP Goal status: MET  2.  Patient will report low back and right leg pain </= 7/10 at worst to reduce functional limitations Baseline: 10/10 pain at worst 07/29/2023: reports pain at worst still 10/10 Goal status: ONGOING  3.  Patient will be able to perform sit to stand without difficulty or using UE for support to improve mobility and indicate improvement in LE strength Baseline: requires UE for support with standing from chair 07/29/2023: able to perform sit to stand without UE support but exhibits difficulty  Goal status: ONGOING  LONG TERM GOALS: Target date: 08/28/2023  Patient will be I with final HEP to maintain progress from PT. Baseline: None provided at eval Goal status: INITIAL  2.  Patient will report Modified Oswestry </= 25/50  (50% disability) in order to indicate an improvement in their functional status Baseline: 37/50 (74% disability) Goal status: INITIAL  3.  Patient will demonstrate improvement in core and hip strength grossly >/= 4-/5 MMT in order to improve her activity tolerance with household tasks Baseline: see limitations above Goal status: INITIAL  4.  Patient will demonstrate right knee strength >/= 4+/5 MMT in order to improve stair negotiation and reduce fall risk due to right knee giving out Baseline: see limitations above Goal status: INITIAL  5. Patient will report lower back and right leg pain </= 4/10 in order to reduce functional limitations  Baseline: 10/10 at worst  Goal status: INITIAL   PLAN: PT FREQUENCY: 1-2x/week  PT DURATION: 8 weeks  PLANNED INTERVENTIONS: 97164- PT Re-evaluation, 97110-Therapeutic exercises, 97530- Therapeutic activity, 97112- Neuromuscular re-education, 97535- Self Care, 16109- Manual therapy, [id number]- Electrical stimulation (unattended), [phone]- Electrical stimulation (manual), Patient/Family education, Balance training, Taping, Dry Needling, Joint mobilization, Joint manipulation, Spinal manipulation, Spinal mobilization, Cryotherapy, and Moist heat.  PLAN FOR NEXT SESSION: Review HEP and progress PRN, continue with modalities for pain relief as needed, manual/TPDN for right lumbar and gluteal region, trial LAD, initiate gentle stretching and mobility for lumbar, initiate gentle core activation, hip and LE strengthening as tolerated    Leah Primus, PT, DPT, LAT, ATC 07/31/23  9:43 AM Phone: [phone] Fax: [phone]

## 2023-08-06 ENCOUNTER — Encounter: Payer: Self-pay | Admitting: Physical Therapy

## 2023-08-06 ENCOUNTER — Ambulatory Visit (INDEPENDENT_AMBULATORY_CARE_PROVIDER_SITE_OTHER): Admitting: Physical Therapy

## 2023-08-06 ENCOUNTER — Other Ambulatory Visit: Payer: Self-pay

## 2023-08-06 DIAGNOSIS — M5459 Other low back pain: Secondary | ICD-10-CM | POA: Diagnosis not present

## 2023-08-06 DIAGNOSIS — M79604 Pain in right leg: Secondary | ICD-10-CM | POA: Diagnosis not present

## 2023-08-06 DIAGNOSIS — M6281 Muscle weakness (generalized): Secondary | ICD-10-CM

## 2023-08-06 NOTE — Therapy (Signed)
 OUTPATIENT PHYSICAL THERAPY TREATMENT   Patient Name: Erica [name] MRN: [id number] DOB:Aug 24, 1975, 48 y.o., female Today's Date: 08/06/2023   END OF SESSION:  PT End of Session - 08/06/23 1027     Visit Number 10    Number of Visits 17    Date for PT Re-Evaluation 08/28/23    Authorization Type [employer] MCR    Progress Note Due on Visit 20    PT Start Time [phone]    PT Stop Time 0930    PT Time Calculation (min) 43 min    Activity Tolerance Patient tolerated treatment well    Behavior During Therapy [facility] for tasks assessed/performed                      Past Medical History:  Diagnosis Date   Allergy     Anxiety    Apnea, sleep 05/04/2014   AHI 31/hr now on CPAP at 12cm H2O   Arthritis    Asthma    Asthma without status asthmaticus 01/19/2016   Axillary hidradenitis suppurativa 08/01/2011   Blood transfusion without reported diagnosis    Breast cancer (HCC) 2010   a. locally advanced left breast carcinoma diagnosed in 2010 and treated with neoadjuvant chemotherapy with docetaxel and cyclophosphamide as well as paclitaxel. This was followed by radiation therapy which was completed in 2011.   Chemotherapy-induced peripheral neuropathy (HCC) 12/09/2016   CHF (congestive heart failure) (HCC)    Chronic diastolic CHF (congestive heart failure) (HCC) 06/23/2013   Chronic kidney disease    Chronic pain in left foot 06/29/2018   Chronic stable angina (HCC) 08/22/2020   Cough variant asthma 09/27/2010   Followed in Pulmonary clinic/ [facility]/ [place]  Onset reported in childhood  - PFT's 01/31/2011 FEV1  1.76 (60% ) ratio 68 and no better p B2,  DLCO 70% -med review 09/25/2012 > [place]  drug assistance  paperwork given    - HFA 90% 11/20/2012 .  -Med calendar 12/07/12 , 04/06/2013 , 04/18/2014,, 08/30/2014 , 11/08/2014  - PFT's 01/12/2013  1.73 (65%)  Ratio 73 and no better p B2, DLCO 86% - trial off    Depression with somatization 06/15/2015   Dyspnea on exertion    a. 01/2013  Lexi MV: EF 54%, no ischemia/infarct;  b. 05/2013 Echo: EF 60-65%, no rwma, Gr 2 DD;  b.    Essential hypertension 10/09/2014   Estimated Creatinine Clearance: 82.4 mL/min (by C-G formula based on SCr of 0.98 mg/dL).   Fatty liver    Gastroparesis    GERD (gastroesophageal reflux disease)    Hyperglycemia 04/13/2011   Hypertension    Hypertensive heart disease 09/26/2015   Hypertriglyceridemia without hypercholesterolemia 06/15/2015   Framingham risk score is 1%    Idiopathic intracranial hypertension 10/21/2014   Impaired glucose tolerance 04/13/2011   Insomnia secondary to anxiety 04/05/2015   Lymphadenitis, chronic    restricted LEFT extremity   Malignant neoplasm of upper-outer quadrant of left breast in female, estrogen receptor negative (HCC) 01/07/2013   Stage III, Invasive Ductal, s/p partial Left Mastectomy/Lumpectomy (baseball sized) with Lymph Node dissection, had Chemo and RadRx     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   OSA (obstructive sleep apnea) 05/04/2014   AHI 31/hr now on CPAP at 12cm H2O   Oxygen deficiency    Uses 02 in CPAP   Persistent proteinuria 03/31/2018   Personal history of chemotherapy 2010   Personal history of radiation therapy 2010   Resistant hypertension 09/17/2011  Sleep apnea    SVT (supraventricular tachycardia) (HCC) 08/22/2020   Tibialis posterior tendinitis, left 07/15/2018   Injected in March 10, 2019   Ulcer    Upper airway cough syndrome 10/09/2014   Onset around 2012 with assoc pseudowheeze MRI 05/18/15 > Paranasal sinuses are clear - 11/17/2015 rec increase h1 to 2 hs to help with noct cough  - Allergy  profile 11/17/2015 >  Eos 0.1 /  IgE  80 POS RAST  to grass, ragweed, cedar tree  - d/c acei 03/06/2018  - flare end of Jan 2020 in setting of ? Uri/ poorly controlled GERD - flare mid Oct 2020 with uri / assoc secondary gerd   Vitamin D  deficiency disease 03/31/2018   Past Surgical History:  Procedure Laterality Date   BRAIN  SURGERY     Shunt   BREAST LUMPECTOMY Left 09/2008   CARPAL TUNNEL RELEASE Left 2011   LYMPH NODE DISSECTION Left 2010   breast; 2 wks after breast lumpectomy   RENAL DENERVATION N/A 11/20/2022   Procedure: RENAL DENERVATION;  Surgeon: Wenona Hamilton, MD;  Location: [facility] INVASIVE CV LAB;  Service: Cardiovascular;  Laterality: N/A;   RIGHT/LEFT HEART CATH AND CORONARY ANGIOGRAPHY N/A 05/04/2019   Procedure: RIGHT/LEFT HEART CATH AND CORONARY ANGIOGRAPHY;  Surgeon: Mardell Shade, MD;  Location: [facility] INV[facility];  Service: Cardiovascular;  Laterality: N/A;  Renal injection preformed    RIGHT/LEFT HEART CATH AND CORONARY ANGIOGRAPHY N/A 10/25/2020   Procedure: RIGHT/LEFT HEART CATH AND CORONARY ANGIOGRAPHY;  Surgeon: Arleen Lacer, MD;  Location: [facility] INVASIVE CV LAB;  Service: Cardiovascular;  Laterality: N/A;   TENDON RELEASE Right 2012   "hand"   TUBAL LIGATION  05/11/2011   Procedure: POST PARTUM TUBAL LIGATION;  Surgeon: Onnie Bilis, MD;  Location: [facility];  Service: Gynecology;  Laterality: Bilateral;  Induced for HTN   Patient Active Problem List   Diagnosis Date Noted   Prediabetes 06/13/2023   Moderate episode of recurrent major depressive disorder (HCC) 02/04/2023   GAD (generalized anxiety disorder) 02/04/2023   Chronic respiratory failure with hypoxia (HCC) 12/31/2021   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   Coronary artery disease    Chronic stable angina (HCC) 08/22/2020   SVT (supraventricular tachycardia) (HCC) 08/22/2020   Palpitations 02/28/2020   Right ankle pain 04/14/2019   OSA (obstructive sleep apnea) 10/01/2018   Tibialis posterior tendinitis, left 07/15/2018   Chronic pain in left foot 06/29/2018   Vitamin D  deficiency disease 03/31/2018   Persistent proteinuria 03/31/2018   Hyperlipidemia LDL goal <100 03/30/2018   Chemotherapy-induced peripheral neuropathy (HCC) 12/09/2016   Asthma in adult, mild persistent, uncomplicated 01/19/2016   Hypertensive heart disease  09/26/2015   Depression with somatization 06/15/2015   Hypertriglyceridemia without hypercholesterolemia 06/15/2015   Hypokalemia 06/15/2015   Insomnia secondary to anxiety 04/05/2015   Idiopathic intracranial hypertension 10/21/2014   Upper airway cough syndrome 10/09/2014   Malignant hypertension 10/09/2014   Morbid obesity due to excess calories (HCC) 10/09/2014   Apnea, sleep 05/04/2014   Acute on chronic diastolic CHF (congestive heart failure), NYHA class 2 (HCC) 05/03/2014   DOE (dyspnea on exertion) 07/14/2013   Acute on chronic diastolic heart failure (HCC) 06/23/2013   Malignant neoplasm of upper-outer quadrant of left breast in female, estrogen receptor negative (HCC) 01/07/2013   Gastroparesis 01/04/2013   Resistant hypertension 09/17/2011   Axillary hidradenitis suppurativa 08/01/2011   Preventative health care 04/13/2011   Hyperglycemia 04/13/2011   GERD (gastroesophageal reflux disease) 03/18/2011  Cough variant asthma 09/27/2010    PCP: Nonda Bays, FNP  REFERRING PROVIDER: Syliva Even, MD  REFERRING DIAG: Lumbar radiculopathy  Rationale for Evaluation and Treatment: Rehabilitation  THERAPY DIAG:  Other low back pain  Pain in right leg  Muscle weakness (generalized)  ONSET DATE: August 2024   SUBJECTIVE:     SUBJECTIVE STATEMENT: Patient reports she is feeling better today. The tape still seems to help her right knee and the pain of the right knee is mainly along the back and outside of the knee.  Eval: Patients reports she had renal denervation done in August 2024 and has been in pain ever since. Pain is located across the lower back, she was told it would hurt for 3-4 months but pain persisted in the lower back. The right knee pain started after in October or November, and it will keep her up at night. She states it feels heavy in her lower back, like labor pain that never stops. The pain will start in the right lower back region and then  will feel like lightning shooting down the right leg to the back and outside of the right knee, pain does not go past the knee. She leans on the bed for relief, and she states getting on her stomach gives her the most relief, but she has a lot of pain when getting up. She does use a heating pad as well. She reports that to help with the knee pain at night she has to have her leg up on the wall. She states she moves around a lot because sitting or standing for long periods will make it worse. Will occasionally have trouble bending down and she states the right leg has given out on her causing her to fall. She does have trouble going up/down stairs, she turns to the side when going down, and she also has breathing issues so when she goes up stairs to get inside she has to immediately get on oxygen. She reports inability to play with or lift her grandchildren and difficulty completing activities around her home for her son.  PERTINENT HISTORY:  See PMH above  PAIN:  Are you having pain? Yes:  NPRS scale: 5/10 currently, 10/10 at worst Pain location: Lower back and right leg/knee Pain description: Heavy, labor pains, constant Aggravating factors: Standing, sitting, activity Relieving factors: Lying on stomach, leaning on bed or counter, heat  PRECAUTIONS: Fall, patient reports she was told not to lift more than 10 lbs  FALLS:  Has patient fallen in last 6 months? Yes. Number of falls 3-4, right knee gives out or sharp pains could cause her to fall  PATIENT GOALS: Pain relief, get back to doing normal things   OBJECTIVE:  Note: Objective measures were completed at Evaluation unless otherwise noted. PATIENT SURVEYS:  Modified Oswestry 37/50 (74% disability)   SENSATION: Patient reports neuropathy of feet and hands  MUSCLE LENGTH: Unable to fully assess, severe increase in right lower back and leg pain when attempting to assess hamstring length  POSTURE:  Rounded shoulder posture, normal  lumbar lordosis, weight shift toward left and patient frequently changing positions while seated for comfort  PALPATION: Exquisite tenderness to right lower lumbar paraspinals, right gluteal and TFL region, right lateral thigh and knee; moderate tenderness to left lumbar paraspinals  Unable to assess lumbar mobility  LUMBAR ROM:   AROM eval  Flexion   Extension   Right lateral flexion   Left lateral flexion   Right  rotation   Left rotation    (Blank rows = not tested)  LOWER EXTREMITY ROM:    Limitations in right hip ROM due to pain and guarding, right knee flexion limited due to pain  LOWER EXTREMITY MMT:    MMT Right eval Left eval Rt / Lt 07/08/2023 Right 07/22/2023 Right 07/31/2023 Rt / Lt 08/06/2023  Hip flexion 3+ 4      Hip extension 2 3- 3 / 3   3 / 3+  Hip abduction 2     3 / 3+  Hip adduction        Hip internal rotation        Hip external rotation        Knee flexion 4- 4  4    Knee extension 4- 4+  4 4 4   Ankle dorsiflexion        Ankle plantarflexion        Ankle inversion        Ankle eversion         (Blank rows = not tested)  LUMBAR SPECIAL TESTS:  SLR positive on right  FUNCTIONAL TESTS:  Sit to stand: patient will difficulty standing from standard chair, requires UE for support with transferring from sit to stand 08/06/2023: patient able to stand from standard chair without support  GAIT: Assistive device utilized: None Level of assistance: Complete Independence Comments: Antalgic on right   TREATMENT [facility] Adult PT Treatment:                                                DATE: 08/06/2023 Recumbent bike L1 x 5 min to improve endurance and workload capacity KT tape for right knee, improving proprioceptive control of patella and muscle support for the right lateral hamstring Prone hip extension 2 x 10 each Sit to stand 2 x 10 Seated stability ball roll out for lumbar stretch 10 x 5 sec Standing abdominal set with stability ball press 2 x 10 x 5  sec Standing pallof press with yellow x 10 each Seated hamstring stretch 3 x 20 sec LAQ 3# 2 x 10 on right   PATIENT EDUCATION:  Education details: HEP, continued taping for right knee Person educated: Patient Education method: Explanation, Demonstration, Tactile cues, Verbal cues Education comprehension: verbalized understanding, returned demonstration, verbal cues required, tactile cues required, and needs further education  HOME EXERCISE PROGRAM: Access Code: [email or web address]    ASSESSMENT: CLINICAL IMPRESSION: Patient tolerated therapy well with no adverse effects. She does exhibit improvement in her hip and knee strength but continues to report persistent pain of the right knee and lower back. Continued with KT tape for the right knee and included a muscle support taping to the lateral hamstring as she continues to report right knee pain along the lateral hamstring tendon. Therapy continues to focus on progressing her core and LE strengthening. She was able to tolerate progression to standing core exercises this visit and progressed weight for quad strengthening. No changes made to her HEP this visit. Patient would benefit from continued skilled PT to progress mobility and strength in order to reduce pain and maximize functional ability.   Eval: Patient is a 48 y.o. female who was seen today for physical therapy evaluation and treatment for chronic lower back and right knee pain. Her symptoms do seem consistent with nonspecific lower back pain and  right sided lumbar radiculopathy. Evaluation was limited this visit due to patient's high symptom severity and irritability level. She did become tearful during the evaluation. Trial of e-stim IFC combined with cold pack with patient reporting pain level reduction, and provided patient with information for purchasing home TENS unit for pain relief. Unable to provide patient with HEP this visit due to inability to complete exercise without severe pain.  Patient was provided extensive education on possible etiologies of her symptoms and treatments that could be utilized in therapy, and gradual progression for return to activity.   OBJECTIVE IMPAIRMENTS: Abnormal gait, decreased activity tolerance, decreased balance, decreased ROM, decreased strength, impaired flexibility, postural dysfunction, and pain.   ACTIVITY LIMITATIONS: carrying, lifting, bending, sitting, standing, squatting, sleeping, stairs, transfers, bed mobility, locomotion level, and caring for others  PARTICIPATION LIMITATIONS: meal prep, cleaning, laundry, driving, shopping, and community activity  PERSONAL FACTORS: Fitness, Past/current experiences, Time since onset of injury/illness/exacerbation, and 3+ comorbidities: see PMH above  are also affecting patient's functional outcome.    GOALS: Goals reviewed with patient? Yes  SHORT TERM GOALS: Target date: 07/31/2023  Patient will be I with initial HEP in order to progress with therapy. Baseline: None provided at eval 07/28/2023: patient reports independence with HEP Goal status: MET  2.  Patient will report low back and right leg pain </= 7/10 at worst to reduce functional limitations Baseline: 10/10 pain at worst 07/29/2023: reports pain at worst still 10/10 08/06/2023: continues to report pain 10/10 at worst Goal status: ONGOING  3.  Patient will be able to perform sit to stand without difficulty or using UE for support to improve mobility and indicate improvement in LE strength Baseline: requires UE for support with standing from chair 07/29/2023: able to perform sit to stand without UE support but exhibits difficulty  08/06/2023: able to perform sit to stand without UE support but exhibits difficulty due to knee pain Goal status: ONGOING  LONG TERM GOALS: Target date: 08/28/2023  Patient will be I with final HEP to maintain progress from PT. Baseline: None provided at eval 08/06/2023: progressing Goal status:  ONGOING  2.  Patient will report Modified Oswestry </= 25/50 (50% disability) in order to indicate an improvement in their functional status Baseline: 37/50 (74% disability) 08/06/2023: not assessed Goal status: DEFERRED  3.  Patient will demonstrate improvement in core and hip strength grossly >/= 4-/5 MMT in order to improve her activity tolerance with household tasks Baseline: see limitations above 08/06/2023: see limitations above Goal status: ONGOING  4.  Patient will demonstrate right knee strength >/= 4+/5 MMT in order to improve stair negotiation and reduce fall risk due to right knee giving out Baseline: see limitations above 08/06/2023: see limitations above Goal status: ONGOING  5. Patient will report lower back and right leg pain </= 4/10 in order to reduce functional limitations  Baseline: 10/10 at worst  08/06/2023: continues to report pain 10/10 at worst  Goal status: ONGOING   PLAN: PT FREQUENCY: 1-2x/week  PT DURATION: 8 weeks  PLANNED INTERVENTIONS: 97164- PT Re-evaluation, 97110-Therapeutic exercises, 97530- Therapeutic activity, 97112- Neuromuscular re-education, 97535- Self Care, 72536- Manual therapy, [id number]- Electrical stimulation (unattended), [phone]- Electrical stimulation (manual), Patient/Family education, Balance training, Taping, Dry Needling, Joint mobilization, Joint manipulation, Spinal manipulation, Spinal mobilization, Cryotherapy, and Moist heat.  PLAN FOR NEXT SESSION: Review HEP and progress PRN, continue with modalities for pain relief as needed, manual/TPDN for right lumbar and gluteal region, trial LAD, initiate gentle stretching and mobility for lumbar,  initiate gentle core activation, hip and LE strengthening as tolerated    Leah Primus, PT, DPT, LAT, ATC 08/06/23  10:34 AM Phone: [phone] Fax: [phone]

## 2023-08-07 ENCOUNTER — Other Ambulatory Visit: Payer: Self-pay

## 2023-08-07 ENCOUNTER — Ambulatory Visit (INDEPENDENT_AMBULATORY_CARE_PROVIDER_SITE_OTHER): Admitting: Physical Therapy

## 2023-08-07 ENCOUNTER — Encounter: Payer: Self-pay | Admitting: Physical Therapy

## 2023-08-07 DIAGNOSIS — M5459 Other low back pain: Secondary | ICD-10-CM

## 2023-08-07 DIAGNOSIS — M79604 Pain in right leg: Secondary | ICD-10-CM | POA: Diagnosis not present

## 2023-08-07 DIAGNOSIS — M6281 Muscle weakness (generalized): Secondary | ICD-10-CM | POA: Diagnosis not present

## 2023-08-07 NOTE — Therapy (Signed)
 OUTPATIENT PHYSICAL THERAPY TREATMENT   Patient Name: Erica [name] MRN: [id number] DOB:1975-10-26, 48 y.o., female Today's Date: 08/07/2023   END OF SESSION:  PT End of Session - 08/07/23 0911     Visit Number 11    Number of Visits 17    Date for PT Re-Evaluation 08/28/23    Authorization Type [employer] MCR    Progress Note Due on Visit 20    PT Start Time 0846    PT Stop Time 0928    PT Time Calculation (min) 42 min    Activity Tolerance Patient tolerated treatment well    Behavior During Therapy [facility] for tasks assessed/performed                       Past Medical History:  Diagnosis Date   Allergy     Anxiety    Apnea, sleep 05/04/2014   AHI 31/hr now on CPAP at 12cm H2O   Arthritis    Asthma    Asthma without status asthmaticus 01/19/2016   Axillary hidradenitis suppurativa 08/01/2011   Blood transfusion without reported diagnosis    Breast cancer (HCC) 2010   a. locally advanced left breast carcinoma diagnosed in 2010 and treated with neoadjuvant chemotherapy with docetaxel and cyclophosphamide as well as paclitaxel. This was followed by radiation therapy which was completed in 2011.   Chemotherapy-induced peripheral neuropathy (HCC) 12/09/2016   CHF (congestive heart failure) (HCC)    Chronic diastolic CHF (congestive heart failure) (HCC) 06/23/2013   Chronic kidney disease    Chronic pain in left foot 06/29/2018   Chronic stable angina (HCC) 08/22/2020   Cough variant asthma 09/27/2010   Followed in Pulmonary clinic/ [facility]/ [facility]  Onset reported in childhood  - PFT's 01/31/2011 FEV1  1.76 (60% ) ratio 68 and no better p B2,  DLCO 70% -med review 09/25/2012 > [place]  drug assistance  paperwork given    - HFA 90% 11/20/2012 .  -Med calendar 12/07/12 , 04/06/2013 , 04/18/2014,, 08/30/2014 , 11/08/2014  - PFT's 01/12/2013  1.73 (65%)  Ratio 73 and no better p B2, DLCO 86% - trial off    Depression with somatization 06/15/2015   Dyspnea on exertion    a.  01/2013 Lexi MV: EF 54%, no ischemia/infarct;  b. 05/2013 Echo: EF 60-65%, no rwma, Gr 2 DD;  b.    Essential hypertension 10/09/2014   Estimated Creatinine Clearance: 82.4 mL/min (by C-G formula based on SCr of 0.98 mg/dL).   Fatty liver    Gastroparesis    GERD (gastroesophageal reflux disease)    Hyperglycemia 04/13/2011   Hypertension    Hypertensive heart disease 09/26/2015   Hypertriglyceridemia without hypercholesterolemia 06/15/2015   Framingham risk score is 1%    Idiopathic intracranial hypertension 10/21/2014   Impaired glucose tolerance 04/13/2011   Insomnia secondary to anxiety 04/05/2015   Lymphadenitis, chronic    restricted LEFT extremity   Malignant neoplasm of upper-outer quadrant of left breast in female, estrogen receptor negative (HCC) 01/07/2013   Stage III, Invasive Ductal, s/p partial Left Mastectomy/Lumpectomy (baseball sized) with Lymph Node dissection, had Chemo and RadRx     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   OSA (obstructive sleep apnea) 05/04/2014   AHI 31/hr now on CPAP at 12cm H2O   Oxygen deficiency    Uses 02 in CPAP   Persistent proteinuria 03/31/2018   Personal history of chemotherapy 2010   Personal history of radiation therapy 2010   Resistant hypertension 09/17/2011  Sleep apnea    SVT (supraventricular tachycardia) (HCC) 08/22/2020   Tibialis posterior tendinitis, left 07/15/2018   Injected in March 10, 2019   Ulcer    Upper airway cough syndrome 10/09/2014   Onset around 2012 with assoc pseudowheeze MRI 05/18/15 > Paranasal sinuses are clear - 11/17/2015 rec increase h1 to 2 hs to help with noct cough  - Allergy  profile 11/17/2015 >  Eos 0.1 /  IgE  80 POS RAST  to grass, ragweed, cedar tree  - d/c acei 03/06/2018  - flare end of Jan 2020 in setting of ? Uri/ poorly controlled GERD - flare mid Oct 2020 with uri / assoc secondary gerd   Vitamin D  deficiency disease 03/31/2018   Past Surgical History:  Procedure Laterality Date    BRAIN SURGERY     Shunt   BREAST LUMPECTOMY Left 09/2008   CARPAL TUNNEL RELEASE Left 2011   LYMPH NODE DISSECTION Left 2010   breast; 2 wks after breast lumpectomy   RENAL DENERVATION N/A 11/20/2022   Procedure: RENAL DENERVATION;  Surgeon: Wenona Hamilton, MD;  Location: [facility] INVASIVE CV LAB;  Service: Cardiovascular;  Laterality: N/A;   RIGHT/LEFT HEART CATH AND CORONARY ANGIOGRAPHY N/A 05/04/2019   Procedure: RIGHT/LEFT HEART CATH AND CORONARY ANGIOGRAPHY;  Surgeon: Mardell Shade, MD;  Location: [facility] INVASIVE CV [facility];  Service: Cardiovascular;  Laterality: N/A;  Renal injection preformed    RIGHT/LEFT HEART CATH AND CORONARY ANGIOGRAPHY N/A 10/25/2020   Procedure: RIGHT/LEFT HEART CATH AND CORONARY ANGIOGRAPHY;  Surgeon: Arleen Lacer, MD;  Location: [facility] INVASIVE CV LAB;  Service: Cardiovascular;  Laterality: N/A;   TENDON RELEASE Right 2012   "hand"   TUBAL LIGATION  05/11/2011   Procedure: POST PARTUM TUBAL LIGATION;  Surgeon: Onnie Bilis, MD;  Location: [facility];  Service: Gynecology;  Laterality: Bilateral;  Induced for HTN   Patient Active Problem List   Diagnosis Date Noted   Prediabetes 06/13/2023   Moderate episode of recurrent major depressive disorder (HCC) 02/04/2023   GAD (generalized anxiety disorder) 02/04/2023   Chronic respiratory failure with hypoxia (HCC) 12/31/2021   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   Coronary artery disease    Chronic stable angina (HCC) 08/22/2020   SVT (supraventricular tachycardia) (HCC) 08/22/2020   Palpitations 02/28/2020   Right ankle pain 04/14/2019   OSA (obstructive sleep apnea) 10/01/2018   Tibialis posterior tendinitis, left 07/15/2018   Chronic pain in left foot 06/29/2018   Vitamin D  deficiency disease 03/31/2018   Persistent proteinuria 03/31/2018   Hyperlipidemia LDL goal <100 03/30/2018   Chemotherapy-induced peripheral neuropathy (HCC) 12/09/2016   Asthma in adult, mild persistent, uncomplicated 01/19/2016   Hypertensive heart disease  09/26/2015   Depression with somatization 06/15/2015   Hypertriglyceridemia without hypercholesterolemia 06/15/2015   Hypokalemia 06/15/2015   Insomnia secondary to anxiety 04/05/2015   Idiopathic intracranial hypertension 10/21/2014   Upper airway cough syndrome 10/09/2014   Malignant hypertension 10/09/2014   Morbid obesity due to excess calories (HCC) 10/09/2014   Apnea, sleep 05/04/2014   Acute on chronic diastolic CHF (congestive heart failure), NYHA class 2 (HCC) 05/03/2014   DOE (dyspnea on exertion) 07/14/2013   Acute on chronic diastolic heart failure (HCC) 06/23/2013   Malignant neoplasm of upper-outer quadrant of left breast in female, estrogen receptor negative (HCC) 01/07/2013   Gastroparesis 01/04/2013   Resistant hypertension 09/17/2011   Axillary hidradenitis suppurativa 08/01/2011   Preventative health care 04/13/2011   Hyperglycemia 04/13/2011   GERD (gastroesophageal reflux disease) 03/18/2011  Cough variant asthma 09/27/2010    PCP: Nonda Bays, FNP  REFERRING PROVIDER: Syliva Even, MD  REFERRING DIAG: Lumbar radiculopathy  Rationale for Evaluation and Treatment: Rehabilitation  THERAPY DIAG:  Other low back pain  Pain in right leg  Muscle weakness (generalized)  ONSET DATE: August 2024   SUBJECTIVE:     SUBJECTIVE STATEMENT: Patient reports she is feeling better today. The tape still seems to help her right knee and the pain of the right knee is mainly along the back and outside of the knee.  Eval: Patients reports she had renal denervation done in August 2024 and has been in pain ever since. Pain is located across the lower back, she was told it would hurt for 3-4 months but pain persisted in the lower back. The right knee pain started after in October or November, and it will keep her up at night. She states it feels heavy in her lower back, like labor pain that never stops. The pain will start in the right lower back region and then  will feel like lightning shooting down the right leg to the back and outside of the right knee, pain does not go past the knee. She leans on the bed for relief, and she states getting on her stomach gives her the most relief, but she has a lot of pain when getting up. She does use a heating pad as well. She reports that to help with the knee pain at night she has to have her leg up on the wall. She states she moves around a lot because sitting or standing for long periods will make it worse. Will occasionally have trouble bending down and she states the right leg has given out on her causing her to fall. She does have trouble going up/down stairs, she turns to the side when going down, and she also has breathing issues so when she goes up stairs to get inside she has to immediately get on oxygen. She reports inability to play with or lift her grandchildren and difficulty completing activities around her home for her son.  PERTINENT HISTORY:  See PMH above  PAIN:  Are you having pain? Yes:  NPRS scale: 5/10 currently, 10/10 at worst Pain location: Lower back and right leg/knee Pain description: Heavy, labor pains, constant Aggravating factors: Standing, sitting, activity Relieving factors: Lying on stomach, leaning on bed or counter, heat  PRECAUTIONS: Fall, patient reports she was told not to lift more than 10 lbs  FALLS:  Has patient fallen in last 6 months? Yes. Number of falls 3-4, right knee gives out or sharp pains could cause her to fall  PATIENT GOALS: Pain relief, get back to doing normal things   OBJECTIVE:  Note: Objective measures were completed at Evaluation unless otherwise noted. PATIENT SURVEYS:  Modified Oswestry 37/50 (74% disability)   SENSATION: Patient reports neuropathy of feet and hands  MUSCLE LENGTH: Unable to fully assess, severe increase in right lower back and leg pain when attempting to assess hamstring length  POSTURE:  Rounded shoulder posture, normal  lumbar lordosis, weight shift toward left and patient frequently changing positions while seated for comfort  PALPATION: Exquisite tenderness to right lower lumbar paraspinals, right gluteal and TFL region, right lateral thigh and knee; moderate tenderness to left lumbar paraspinals  Unable to assess lumbar mobility  LUMBAR ROM:   AROM eval  Flexion   Extension   Right lateral flexion   Left lateral flexion   Right  rotation   Left rotation    (Blank rows = not tested)  LOWER EXTREMITY ROM:    Limitations in right hip ROM due to pain and guarding, right knee flexion limited due to pain  LOWER EXTREMITY MMT:    MMT Right eval Left eval Rt / Lt 07/08/2023 Right 07/22/2023 Right 07/31/2023 Rt / Lt 08/06/2023  Hip flexion 3+ 4      Hip extension 2 3- 3 / 3   3 / 3+  Hip abduction 2     3 / 3+  Hip adduction        Hip internal rotation        Hip external rotation        Knee flexion 4- 4  4    Knee extension 4- 4+  4 4 4   Ankle dorsiflexion        Ankle plantarflexion        Ankle inversion        Ankle eversion         (Blank rows = not tested)  LUMBAR SPECIAL TESTS:  SLR positive on right  FUNCTIONAL TESTS:  Sit to stand: patient will difficulty standing from standard chair, requires UE for support with transferring from sit to stand 08/06/2023: patient able to stand from standard chair without support  GAIT: Assistive device utilized: None Level of assistance: Complete Independence Comments: Antalgic on right   TREATMENT [facility] Adult PT Treatment:                                                DATE: 08/07/2023 Recumbent bike L3 x 5 min to improve endurance and workload capacity Seated hamstring curl with yellow 3 x 8 right LAQ with 4# 3 x 10 right Prone knee flexion 2 x 10 right Prone hip extension 2 x 10 right Sit to stand 2 x 10 Standing TKE with yellow 2 x 10 right Standing hip abduction 2 x 10 each   PATIENT EDUCATION:  Education details: HEP Person  educated: Patient Education method: Programmer, multimedia, Demonstration, Tactile cues, Verbal cues Education comprehension: verbalized understanding, returned demonstration, verbal cues required, tactile cues required, and needs further education  HOME EXERCISE PROGRAM: Access Code: [email or web address]    ASSESSMENT: CLINICAL IMPRESSION: Patient tolerated therapy well with no adverse effects. Therapy focuses primarily on progressing right knee and hip strengthening. She did exhibit difficulty with right hamstring strengthening with increase in knee pain. She does seem to be improving with her sit to stand exercise and was able to progress to some standing hip exercises with good tolerance. No changes made to her HEP this visit. Patient would benefit from continued skilled PT to progress mobility and strength in order to reduce pain and maximize functional ability.   Eval: Patient is a 48 y.o. female who was seen today for physical therapy evaluation and treatment for chronic lower back and right knee pain. Her symptoms do seem consistent with nonspecific lower back pain and right sided lumbar radiculopathy. Evaluation was limited this visit due to patient's high symptom severity and irritability level. She did become tearful during the evaluation. Trial of e-stim IFC combined with cold pack with patient reporting pain level reduction, and provided patient with information for purchasing home TENS unit for pain relief. Unable to provide patient with HEP this visit due to inability to complete exercise without  severe pain. Patient was provided extensive education on possible etiologies of her symptoms and treatments that could be utilized in therapy, and gradual progression for return to activity.   OBJECTIVE IMPAIRMENTS: Abnormal gait, decreased activity tolerance, decreased balance, decreased ROM, decreased strength, impaired flexibility, postural dysfunction, and pain.   ACTIVITY LIMITATIONS: carrying, lifting,  bending, sitting, standing, squatting, sleeping, stairs, transfers, bed mobility, locomotion level, and caring for others  PARTICIPATION LIMITATIONS: meal prep, cleaning, laundry, driving, shopping, and community activity  PERSONAL FACTORS: Fitness, Past/current experiences, Time since onset of injury/illness/exacerbation, and 3+ comorbidities: see PMH above are also affecting patient's functional outcome.    GOALS: Goals reviewed with patient? Yes  SHORT TERM GOALS: Target date: 07/31/2023  Patient will be I with initial HEP in order to progress with therapy. Baseline: None provided at eval 07/28/2023: patient reports independence with HEP Goal status: MET  2.  Patient will report low back and right leg pain </= 7/10 at worst to reduce functional limitations Baseline: 10/10 pain at worst 07/29/2023: reports pain at worst still 10/10 08/06/2023: continues to report pain 10/10 at worst Goal status: ONGOING  3.  Patient will be able to perform sit to stand without difficulty or using UE for support to improve mobility and indicate improvement in LE strength Baseline: requires UE for support with standing from chair 07/29/2023: able to perform sit to stand without UE support but exhibits difficulty  08/06/2023: able to perform sit to stand without UE support but exhibits difficulty due to knee pain Goal status: ONGOING  LONG TERM GOALS: Target date: 08/28/2023  Patient will be I with final HEP to maintain progress from PT. Baseline: None provided at eval 08/06/2023: progressing Goal status: ONGOING  2.  Patient will report Modified Oswestry </= 25/50 (50% disability) in order to indicate an improvement in their functional status Baseline: 37/50 (74% disability) 08/06/2023: not assessed Goal status: DEFERRED  3.  Patient will demonstrate improvement in core and hip strength grossly >/= 4-/5 MMT in order to improve her activity tolerance with household tasks Baseline: see limitations  above 08/06/2023: see limitations above Goal status: ONGOING  4.  Patient will demonstrate right knee strength >/= 4+/5 MMT in order to improve stair negotiation and reduce fall risk due to right knee giving out Baseline: see limitations above 08/06/2023: see limitations above Goal status: ONGOING  5. Patient will report lower back and right leg pain </= 4/10 in order to reduce functional limitations  Baseline: 10/10 at worst  08/06/2023: continues to report pain 10/10 at worst  Goal status: ONGOING   PLAN: PT FREQUENCY: 1-2x/week  PT DURATION: 8 weeks  PLANNED INTERVENTIONS: 97164- PT Re-evaluation, 97110-Therapeutic exercises, 97530- Therapeutic activity, 97112- Neuromuscular re-education, 97535- Self Care, [id number]- Manual therapy, [id number]- Electrical stimulation (unattended), [phone]- Electrical stimulation (manual), Patient/Family education, Balance training, Taping, Dry Needling, Joint mobilization, Joint manipulation, Spinal manipulation, Spinal mobilization, Cryotherapy, and Moist heat.  PLAN FOR NEXT SESSION: Review HEP and progress PRN, continue with modalities for pain relief as needed, manual/TPDN for right lumbar and gluteal region, trial LAD, initiate gentle stretching and mobility for lumbar, initiate gentle core activation, hip and LE strengthening as tolerated    Leah Primus, PT, DPT, LAT, ATC 08/07/23  9:36 AM Phone: [phone] Fax: [phone]

## 2023-08-12 ENCOUNTER — Ambulatory Visit (INDEPENDENT_AMBULATORY_CARE_PROVIDER_SITE_OTHER): Admitting: Physical Therapy

## 2023-08-12 ENCOUNTER — Encounter: Payer: Self-pay | Admitting: Physical Therapy

## 2023-08-12 ENCOUNTER — Other Ambulatory Visit: Payer: Self-pay

## 2023-08-12 DIAGNOSIS — M6281 Muscle weakness (generalized): Secondary | ICD-10-CM

## 2023-08-12 DIAGNOSIS — M5459 Other low back pain: Secondary | ICD-10-CM | POA: Diagnosis not present

## 2023-08-12 DIAGNOSIS — M79604 Pain in right leg: Secondary | ICD-10-CM

## 2023-08-12 NOTE — Therapy (Signed)
 OUTPATIENT PHYSICAL THERAPY TREATMENT   Patient Name: Erica [name] MRN: [id number] DOB:09-Dec-1975, 48 y.o., female Today's Date: 08/12/2023   END OF SESSION:  PT End of Session - 08/12/23 0849     Visit Number 12    Number of Visits 17    Date for PT Re-Evaluation 08/28/23    Authorization Type [employer] MCR    Progress Note Due on Visit 20    PT Start Time 0846    PT Stop Time 0927    PT Time Calculation (min) 41 min    Activity Tolerance Patient tolerated treatment well    Behavior During Therapy [facility] for tasks assessed/performed                        Past Medical History:  Diagnosis Date   Allergy     Anxiety    Apnea, sleep 05/04/2014   AHI 31/hr now on CPAP at 12cm H2O   Arthritis    Asthma    Asthma without status asthmaticus 01/19/2016   Axillary hidradenitis suppurativa 08/01/2011   Blood transfusion without reported diagnosis    Breast cancer (HCC) 2010   a. locally advanced left breast carcinoma diagnosed in 2010 and treated with neoadjuvant chemotherapy with docetaxel and cyclophosphamide as well as paclitaxel. This was followed by radiation therapy which was completed in 2011.   Chemotherapy-induced peripheral neuropathy (HCC) 12/09/2016   CHF (congestive heart failure) (HCC)    Chronic diastolic CHF (congestive heart failure) (HCC) 06/23/2013   Chronic kidney disease    Chronic pain in left foot 06/29/2018   Chronic stable angina (HCC) 08/22/2020   Cough variant asthma 09/27/2010   Followed in Pulmonary clinic/ [facility]/ [place]  Onset reported in childhood  - PFT's 01/31/2011 FEV1  1.76 (60% ) ratio 68 and no better p B2,  DLCO 70% -med review 09/25/2012 > [place]  drug assistance  paperwork given    - HFA 90% 11/20/2012 .  -Med calendar 12/07/12 , 04/06/2013 , 04/18/2014,, 08/30/2014 , 11/08/2014  - PFT's 01/12/2013  1.73 (65%)  Ratio 73 and no better p B2, DLCO 86% - trial off    Depression with somatization 06/15/2015   Dyspnea on exertion    a.  01/2013 Lexi MV: EF 54%, no ischemia/infarct;  b. 05/2013 Echo: EF 60-65%, no rwma, Gr 2 DD;  b.    Essential hypertension 10/09/2014   Estimated Creatinine Clearance: 82.4 mL/min (by C-G formula based on SCr of 0.98 mg/dL).   Fatty liver    Gastroparesis    GERD (gastroesophageal reflux disease)    Hyperglycemia 04/13/2011   Hypertension    Hypertensive heart disease 09/26/2015   Hypertriglyceridemia without hypercholesterolemia 06/15/2015   Framingham risk score is 1%    Idiopathic intracranial hypertension 10/21/2014   Impaired glucose tolerance 04/13/2011   Insomnia secondary to anxiety 04/05/2015   Lymphadenitis, chronic    restricted LEFT extremity   Malignant neoplasm of upper-outer quadrant of left breast in female, estrogen receptor negative (HCC) 01/07/2013   Stage III, Invasive Ductal, s/p partial Left Mastectomy/Lumpectomy (baseball sized) with Lymph Node dissection, had Chemo and RadRx     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   OSA (obstructive sleep apnea) 05/04/2014   AHI 31/hr now on CPAP at 12cm H2O   Oxygen deficiency    Uses 02 in CPAP   Persistent proteinuria 03/31/2018   Personal history of chemotherapy 2010   Personal history of radiation therapy 2010   Resistant hypertension  09/17/2011       Sleep apnea    SVT (supraventricular tachycardia) (HCC) 08/22/2020   Tibialis posterior tendinitis, left 07/15/2018   Injected in March 10, 2019   Ulcer    Upper airway cough syndrome 10/09/2014   Onset around 2012 with assoc pseudowheeze MRI 05/18/15 > Paranasal sinuses are clear - 11/17/2015 rec increase h1 to 2 hs to help with noct cough  - Allergy  profile 11/17/2015 >  Eos 0.1 /  IgE  80 POS RAST  to grass, ragweed, cedar tree  - d/c acei 03/06/2018  - flare end of Jan 2020 in setting of ? Uri/ poorly controlled GERD - flare mid Oct 2020 with uri / assoc secondary gerd   Vitamin D  deficiency disease 03/31/2018   Past Surgical History:  Procedure Laterality Date    BRAIN SURGERY     Shunt   BREAST LUMPECTOMY Left 09/2008   CARPAL TUNNEL RELEASE Left 2011   LYMPH NODE DISSECTION Left 2010   breast; 2 wks after breast lumpectomy   RENAL DENERVATION N/A 11/20/2022   Procedure: RENAL DENERVATION;  Surgeon: Wenona Hamilton, MD;  Location: [facility] INVASIVE CV LAB;  Service: Cardiovascular;  Laterality: N/A;   RIGHT/LEFT HEART CATH AND CORONARY ANGIOGRAPHY N/A 05/04/2019   Procedure: RIGHT/LEFT HEART CATH AND CORONARY ANGIOGRAPHY;  Surgeon: Mardell Shade, MD;  Location: [facility] INVASIVE CV LAB;  Service: Cardiovascular;  Laterality: N/A;  Renal injection preformed    RIGHT/LEFT HEART CATH AND CORONARY ANGIOGRAPHY N/A 10/25/2020   Procedure: RIGHT/LEFT HEART CATH AND CORONARY ANGIOGRAPHY;  Surgeon: Arleen Lacer, MD;  Location: [facility] INVASIVE CV LAB;  Service: Cardiovascular;  Laterality: N/A;   TENDON RELEASE Right 2012   "hand"   TUBAL LIGATION  05/11/2011   Procedure: POST PARTUM TUBAL LIGATION;  Surgeon: Onnie Bilis, MD;  Location: [facility];  Service: Gynecology;  Laterality: Bilateral;  Induced for HTN   Patient Active Problem List   Diagnosis Date Noted   Prediabetes 06/13/2023   Moderate episode of recurrent major depressive disorder (HCC) 02/04/2023   GAD (generalized anxiety disorder) 02/04/2023   Chronic respiratory failure with hypoxia (HCC) 12/31/2021   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   Coronary artery disease    Chronic stable angina (HCC) 08/22/2020   SVT (supraventricular tachycardia) (HCC) 08/22/2020   Palpitations 02/28/2020   Right ankle pain 04/14/2019   OSA (obstructive sleep apnea) 10/01/2018   Tibialis posterior tendinitis, left 07/15/2018   Chronic pain in left foot 06/29/2018   Vitamin D  deficiency disease 03/31/2018   Persistent proteinuria 03/31/2018   Hyperlipidemia LDL goal <100 03/30/2018   Chemotherapy-induced peripheral neuropathy (HCC) 12/09/2016   Asthma in adult, mild persistent, uncomplicated 01/19/2016   Hypertensive heart disease  09/26/2015   Depression with somatization 06/15/2015   Hypertriglyceridemia without hypercholesterolemia 06/15/2015   Hypokalemia 06/15/2015   Insomnia secondary to anxiety 04/05/2015   Idiopathic intracranial hypertension 10/21/2014   Upper airway cough syndrome 10/09/2014   Malignant hypertension 10/09/2014   Morbid obesity due to excess calories (HCC) 10/09/2014   Apnea, sleep 05/04/2014   Acute on chronic diastolic CHF (congestive heart failure), NYHA class 2 (HCC) 05/03/2014   DOE (dyspnea on exertion) 07/14/2013   Acute on chronic diastolic heart failure (HCC) 06/23/2013   Malignant neoplasm of upper-outer quadrant of left breast in female, estrogen receptor negative (HCC) 01/07/2013   Gastroparesis 01/04/2013   Resistant hypertension 09/17/2011   Axillary hidradenitis suppurativa 08/01/2011   Preventative health care 04/13/2011   Hyperglycemia 04/13/2011  GERD (gastroesophageal reflux disease) 03/18/2011   Cough variant asthma 09/27/2010    PCP: Nonda Bays, FNP  REFERRING PROVIDER: Syliva Even, MD  REFERRING DIAG: Lumbar radiculopathy  Rationale for Evaluation and Treatment: Rehabilitation  THERAPY DIAG:  Other low back pain  Pain in right leg  Muscle weakness (generalized)  ONSET DATE: August 2024   SUBJECTIVE:     SUBJECTIVE STATEMENT: Patient reports she is doing well today. She does report feeling a sensation of tape rolling up on her right outer arm whenever she leans on her arms, states it is not painful but just a weird sensation.  Eval: Patients reports she had renal denervation done in August 2024 and has been in pain ever since. Pain is located across the lower back, she was told it would hurt for 3-4 months but pain persisted in the lower back. The right knee pain started after in October or November, and it will keep her up at night. She states it feels heavy in her lower back, like labor pain that never stops. The pain will start in the  right lower back region and then will feel like lightning shooting down the right leg to the back and outside of the right knee, pain does not go past the knee. She leans on the bed for relief, and she states getting on her stomach gives her the most relief, but she has a lot of pain when getting up. She does use a heating pad as well. She reports that to help with the knee pain at night she has to have her leg up on the wall. She states she moves around a lot because sitting or standing for long periods will make it worse. Will occasionally have trouble bending down and she states the right leg has given out on her causing her to fall. She does have trouble going up/down stairs, she turns to the side when going down, and she also has breathing issues so when she goes up stairs to get inside she has to immediately get on oxygen. She reports inability to play with or lift her grandchildren and difficulty completing activities around her home for her son.  PERTINENT HISTORY:  See PMH above  PAIN:  Are you having pain? Yes:  NPRS scale: 5/10 currently, 10/10 at worst Pain location: Lower back and right leg/knee Pain description: Heavy, labor pains, constant Aggravating factors: Standing, sitting, activity Relieving factors: Lying on stomach, leaning on bed or counter, heat  PRECAUTIONS: Fall, patient reports she was told not to lift more than 10 lbs  FALLS:  Has patient fallen in last 6 months? Yes. Number of falls 3-4, right knee gives out or sharp pains could cause her to fall  PATIENT GOALS: Pain relief, get back to doing normal things   OBJECTIVE:  Note: Objective measures were completed at Evaluation unless otherwise noted. PATIENT SURVEYS:  Modified Oswestry 37/50 (74% disability)   SENSATION: Patient reports neuropathy of feet and hands  MUSCLE LENGTH: Unable to fully assess, severe increase in right lower back and leg pain when attempting to assess hamstring length  POSTURE:   Rounded shoulder posture, normal lumbar lordosis, weight shift toward left and patient frequently changing positions while seated for comfort  PALPATION: Exquisite tenderness to right lower lumbar paraspinals, right gluteal and TFL region, right lateral thigh and knee; moderate tenderness to left lumbar paraspinals  Unable to assess lumbar mobility  LUMBAR ROM:   AROM eval  Flexion   Extension  Right lateral flexion   Left lateral flexion   Right rotation   Left rotation    (Blank rows = not tested)  LOWER EXTREMITY ROM:    Limitations in right hip ROM due to pain and guarding, right knee flexion limited due to pain  LOWER EXTREMITY MMT:    MMT Right eval Left eval Rt / Lt 07/08/2023 Right 07/22/2023 Right 07/31/2023 Rt / Lt 08/06/2023  Hip flexion 3+ 4      Hip extension 2 3- 3 / 3   3 / 3+  Hip abduction 2     3 / 3+  Hip adduction        Hip internal rotation        Hip external rotation        Knee flexion 4- 4  4    Knee extension 4- 4+  4 4 4   Ankle dorsiflexion        Ankle plantarflexion        Ankle inversion        Ankle eversion         (Blank rows = not tested)  LUMBAR SPECIAL TESTS:  SLR positive on right  FUNCTIONAL TESTS:  Sit to stand: patient will difficulty standing from standard chair, requires UE for support with transferring from sit to stand 08/06/2023: patient able to stand from standard chair without support  GAIT: Assistive device utilized: None Level of assistance: Complete Independence Comments: Antalgic on right   TREATMENT [facility] Adult PT Treatment:                                                DATE: 08/12/2023 Recumbent bike L3 x 5 min to improve endurance and workload capacity Sit to stand 2 x 10 Standing hip abduction 2 x 10 each Standing heel raises 2 x 10 LAQ with 4# 3 x 10 each Seated hamstring curl with yellow 2 x 10 each Standing hamstring curl 2 x 10 each Standing TKE with yellow 2 x 10 right   PATIENT EDUCATION:   Education details: HEP Person educated: Patient Education method: Explanation, Demonstration, Tactile cues, Verbal cues Education comprehension: verbalized understanding, returned demonstration, verbal cues required, tactile cues required, and needs further education  HOME EXERCISE PROGRAM: Access Code: [email or web address]    ASSESSMENT: CLINICAL IMPRESSION: Patient tolerated therapy well with no adverse effects. Therapy focused primarily on strengthening for LE and core region with better tolerance. She does continue to have most difficulty with hamstring strengthening exercises on the right with pain reported posterolateral right knee. She does seem to be tolerated exercises better overall. No changes made to her HEP this visit. Patient would benefit from continued skilled PT to progress mobility and strength in order to reduce pain and maximize functional ability.   Eval: Patient is a 48 y.o. female who was seen today for physical therapy evaluation and treatment for chronic lower back and right knee pain. Her symptoms do seem consistent with nonspecific lower back pain and right sided lumbar radiculopathy. Evaluation was limited this visit due to patient's high symptom severity and irritability level. She did become tearful during the evaluation. Trial of e-stim IFC combined with cold pack with patient reporting pain level reduction, and provided patient with information for purchasing home TENS unit for pain relief. Unable to provide patient with HEP this visit due to inability  to complete exercise without severe pain. Patient was provided extensive education on possible etiologies of her symptoms and treatments that could be utilized in therapy, and gradual progression for return to activity.   OBJECTIVE IMPAIRMENTS: Abnormal gait, decreased activity tolerance, decreased balance, decreased ROM, decreased strength, impaired flexibility, postural dysfunction, and pain.   ACTIVITY LIMITATIONS: carrying,  lifting, bending, sitting, standing, squatting, sleeping, stairs, transfers, bed mobility, locomotion level, and caring for others  PARTICIPATION LIMITATIONS: meal prep, cleaning, laundry, driving, shopping, and community activity  PERSONAL FACTORS: Fitness, Past/current experiences, Time since onset of injury/illness/exacerbation, and 3+ comorbidities: see PMH above are also affecting patient's functional outcome.    GOALS: Goals reviewed with patient? Yes  SHORT TERM GOALS: Target date: 07/31/2023  Patient will be I with initial HEP in order to progress with therapy. Baseline: None provided at eval 07/28/2023: patient reports independence with HEP Goal status: MET  2.  Patient will report low back and right leg pain </= 7/10 at worst to reduce functional limitations Baseline: 10/10 pain at worst 07/29/2023: reports pain at worst still 10/10 08/06/2023: continues to report pain 10/10 at worst Goal status: ONGOING  3.  Patient will be able to perform sit to stand without difficulty or using UE for support to improve mobility and indicate improvement in LE strength Baseline: requires UE for support with standing from chair 07/29/2023: able to perform sit to stand without UE support but exhibits difficulty  08/06/2023: able to perform sit to stand without UE support but exhibits difficulty due to knee pain Goal status: ONGOING  LONG TERM GOALS: Target date: 08/28/2023  Patient will be I with final HEP to maintain progress from PT. Baseline: None provided at eval 08/06/2023: progressing Goal status: ONGOING  2.  Patient will report Modified Oswestry </= 25/50 (50% disability) in order to indicate an improvement in their functional status Baseline: 37/50 (74% disability) 08/06/2023: not assessed Goal status: DEFERRED  3.  Patient will demonstrate improvement in core and hip strength grossly >/= 4-/5 MMT in order to improve her activity tolerance with household tasks Baseline: see limitations  above 08/06/2023: see limitations above Goal status: ONGOING  4.  Patient will demonstrate right knee strength >/= 4+/5 MMT in order to improve stair negotiation and reduce fall risk due to right knee giving out Baseline: see limitations above 08/06/2023: see limitations above Goal status: ONGOING  5. Patient will report lower back and right leg pain </= 4/10 in order to reduce functional limitations  Baseline: 10/10 at worst  08/06/2023: continues to report pain 10/10 at worst  Goal status: ONGOING   PLAN: PT FREQUENCY: 1-2x/week  PT DURATION: 8 weeks  PLANNED INTERVENTIONS: 97164- PT Re-evaluation, 97110-Therapeutic exercises, 97530- Therapeutic activity, 97112- Neuromuscular re-education, 97535- Self Care, 10932- Manual therapy, [id number]- Electrical stimulation (unattended), [phone]- Electrical stimulation (manual), Patient/Family education, Balance training, Taping, Dry Needling, Joint mobilization, Joint manipulation, Spinal manipulation, Spinal mobilization, Cryotherapy, and Moist heat.  PLAN FOR NEXT SESSION: Review HEP and progress PRN, continue with modalities for pain relief as needed, manual/TPDN for right lumbar and gluteal region, trial LAD, initiate gentle stretching and mobility for lumbar, initiate gentle core activation, hip and LE strengthening as tolerated    Leah Primus, PT, DPT, LAT, ATC 08/12/23  9:29 AM Phone: [phone] Fax: [phone]

## 2023-08-14 ENCOUNTER — Ambulatory Visit (INDEPENDENT_AMBULATORY_CARE_PROVIDER_SITE_OTHER): Admitting: Physical Therapy

## 2023-08-14 ENCOUNTER — Encounter: Payer: Self-pay | Admitting: Physical Therapy

## 2023-08-14 ENCOUNTER — Other Ambulatory Visit: Payer: Self-pay

## 2023-08-14 DIAGNOSIS — M5459 Other low back pain: Secondary | ICD-10-CM

## 2023-08-14 DIAGNOSIS — M79604 Pain in right leg: Secondary | ICD-10-CM | POA: Diagnosis not present

## 2023-08-14 DIAGNOSIS — M6281 Muscle weakness (generalized): Secondary | ICD-10-CM | POA: Diagnosis not present

## 2023-08-14 NOTE — Therapy (Signed)
 OUTPATIENT PHYSICAL THERAPY TREATMENT   Patient Name: Erica [name] MRN: [id number] DOB:04/15/1975, 48 y.o., female Today's Date: 08/14/2023   END OF SESSION:  PT End of Session - 08/14/23 0911     Visit Number 13    Number of Visits 17    Date for PT Re-Evaluation 08/28/23    Authorization Type [employer] MCR    Progress Note Due on Visit 20    PT Start Time [phone]    PT Stop Time 0930    PT Time Calculation (min) 44 min    Activity Tolerance Patient tolerated treatment well    Behavior During Therapy [facility] for tasks assessed/performed                         Past Medical History:  Diagnosis Date   Allergy     Anxiety    Apnea, sleep 05/04/2014   AHI 31/hr now on CPAP at 12cm H2O   Arthritis    Asthma    Asthma without status asthmaticus 01/19/2016   Axillary hidradenitis suppurativa 08/01/2011   Blood transfusion without reported diagnosis    Breast cancer (HCC) 2010   a. locally advanced left breast carcinoma diagnosed in 2010 and treated with neoadjuvant chemotherapy with docetaxel and cyclophosphamide as well as paclitaxel. This was followed by radiation therapy which was completed in 2011.   Chemotherapy-induced peripheral neuropathy (HCC) 12/09/2016   CHF (congestive heart failure) (HCC)    Chronic diastolic CHF (congestive heart failure) (HCC) 06/23/2013   Chronic kidney disease    Chronic pain in left foot 06/29/2018   Chronic stable angina (HCC) 08/22/2020   Cough variant asthma 09/27/2010   Followed in Pulmonary clinic/ [facility]/ [place]  Onset reported in childhood  - PFT's 01/31/2011 FEV1  1.76 (60% ) ratio 68 and no better p B2,  DLCO 70% -med review 09/25/2012 > [place]  drug assistance  paperwork given    - HFA 90% 11/20/2012 .  -Med calendar 12/07/12 , 04/06/2013 , 04/18/2014,, 08/30/2014 , 11/08/2014  - PFT's 01/12/2013  1.73 (65%)  Ratio 73 and no better p B2, DLCO 86% - trial off    Depression with somatization 06/15/2015   Dyspnea on exertion    a.  01/2013 Lexi MV: EF 54%, no ischemia/infarct;  b. 05/2013 Echo: EF 60-65%, no rwma, Gr 2 DD;  b.    Essential hypertension 10/09/2014   Estimated Creatinine Clearance: 82.4 mL/min (by C-G formula based on SCr of 0.98 mg/dL).   Fatty liver    Gastroparesis    GERD (gastroesophageal reflux disease)    Hyperglycemia 04/13/2011   Hypertension    Hypertensive heart disease 09/26/2015   Hypertriglyceridemia without hypercholesterolemia 06/15/2015   Framingham risk score is 1%    Idiopathic intracranial hypertension 10/21/2014   Impaired glucose tolerance 04/13/2011   Insomnia secondary to anxiety 04/05/2015   Lymphadenitis, chronic    restricted LEFT extremity   Malignant neoplasm of upper-outer quadrant of left breast in female, estrogen receptor negative (HCC) 01/07/2013   Stage III, Invasive Ductal, s/p partial Left Mastectomy/Lumpectomy (baseball sized) with Lymph Node dissection, had Chemo and RadRx     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   OSA (obstructive sleep apnea) 05/04/2014   AHI 31/hr now on CPAP at 12cm H2O   Oxygen deficiency    Uses 02 in CPAP   Persistent proteinuria 03/31/2018   Personal history of chemotherapy 2010   Personal history of radiation therapy 2010   Resistant  hypertension 09/17/2011       Sleep apnea    SVT (supraventricular tachycardia) (HCC) 08/22/2020   Tibialis posterior tendinitis, left 07/15/2018   Injected in March 10, 2019   Ulcer    Upper airway cough syndrome 10/09/2014   Onset around 2012 with assoc pseudowheeze MRI 05/18/15 > Paranasal sinuses are clear - 11/17/2015 rec increase h1 to 2 hs to help with noct cough  - Allergy  profile 11/17/2015 >  Eos 0.1 /  IgE  80 POS RAST  to grass, ragweed, cedar tree  - d/c acei 03/06/2018  - flare end of Jan 2020 in setting of ? Uri/ poorly controlled GERD - flare mid Oct 2020 with uri / assoc secondary gerd   Vitamin D  deficiency disease 03/31/2018   Past Surgical History:  Procedure Laterality Date    BRAIN SURGERY     Shunt   BREAST LUMPECTOMY Left 09/2008   CARPAL TUNNEL RELEASE Left 2011   LYMPH NODE DISSECTION Left 2010   breast; 2 wks after breast lumpectomy   RENAL DENERVATION N/A 11/20/2022   Procedure: RENAL DENERVATION;  Surgeon: Wenona Hamilton, MD;  Location: [facility] INVASIVE CV LAB;  Service: Cardiovascular;  Laterality: N/A;   RIGHT/LEFT HEART CATH AND CORONARY ANGIOGRAPHY N/A 05/04/2019   Procedure: RIGHT/LEFT HEART CATH AND CORONARY ANGIOGRAPHY;  Surgeon: Mardell Shade, MD;  Location: [facility] INVASIVE CV LAB;  Service: Cardiovascular;  Laterality: N/A;  Renal injection preformed    RIGHT/LEFT HEART CATH AND CORONARY ANGIOGRAPHY N/A 10/25/2020   Procedure: RIGHT/LEFT HEART CATH AND CORONARY ANGIOGRAPHY;  Surgeon: Arleen Lacer, MD;  Location: [facility] INVASIVE CV LAB;  Service: Cardiovascular;  Laterality: N/A;   TENDON RELEASE Right 2012   "hand"   TUBAL LIGATION  05/11/2011   Procedure: POST PARTUM TUBAL LIGATION;  Surgeon: Onnie Bilis, MD;  Location: [facility];  Service: Gynecology;  Laterality: Bilateral;  Induced for HTN   Patient Active Problem List   Diagnosis Date Noted   Prediabetes 06/13/2023   Moderate episode of recurrent major depressive disorder (HCC) 02/04/2023   GAD (generalized anxiety disorder) 02/04/2023   Chronic respiratory failure with hypoxia (HCC) 12/31/2021   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   Coronary artery disease    Chronic stable angina (HCC) 08/22/2020   SVT (supraventricular tachycardia) (HCC) 08/22/2020   Palpitations 02/28/2020   Right ankle pain 04/14/2019   OSA (obstructive sleep apnea) 10/01/2018   Tibialis posterior tendinitis, left 07/15/2018   Chronic pain in left foot 06/29/2018   Vitamin D  deficiency disease 03/31/2018   Persistent proteinuria 03/31/2018   Hyperlipidemia LDL goal <100 03/30/2018   Chemotherapy-induced peripheral neuropathy (HCC) 12/09/2016   Asthma in adult, mild persistent, uncomplicated 01/19/2016   Hypertensive heart disease  09/26/2015   Depression with somatization 06/15/2015   Hypertriglyceridemia without hypercholesterolemia 06/15/2015   Hypokalemia 06/15/2015   Insomnia secondary to anxiety 04/05/2015   Idiopathic intracranial hypertension 10/21/2014   Upper airway cough syndrome 10/09/2014   Malignant hypertension 10/09/2014   Morbid obesity due to excess calories (HCC) 10/09/2014   Apnea, sleep 05/04/2014   Acute on chronic diastolic CHF (congestive heart failure), NYHA class 2 (HCC) 05/03/2014   DOE (dyspnea on exertion) 07/14/2013   Acute on chronic diastolic heart failure (HCC) 06/23/2013   Malignant neoplasm of upper-outer quadrant of left breast in female, estrogen receptor negative (HCC) 01/07/2013   Gastroparesis 01/04/2013   Resistant hypertension 09/17/2011   Axillary hidradenitis suppurativa 08/01/2011   Preventative health care 04/13/2011   Hyperglycemia 04/13/2011  GERD (gastroesophageal reflux disease) 03/18/2011   Cough variant asthma 09/27/2010    PCP: Nonda Bays, FNP  REFERRING PROVIDER: Syliva Even, MD  REFERRING DIAG: Lumbar radiculopathy  Rationale for Evaluation and Treatment: Rehabilitation  THERAPY DIAG:  Other low back pain  Pain in right leg  Muscle weakness (generalized)  ONSET DATE: August 2024   SUBJECTIVE:     SUBJECTIVE STATEMENT: Patient reports shooting/lightning pain in the front and outside of the right knee.   Eval: Patients reports she had renal denervation done in August 2024 and has been in pain ever since. Pain is located across the lower back, she was told it would hurt for 3-4 months but pain persisted in the lower back. The right knee pain started after in October or November, and it will keep her up at night. She states it feels heavy in her lower back, like labor pain that never stops. The pain will start in the right lower back region and then will feel like lightning shooting down the right leg to the back and outside of the  right knee, pain does not go past the knee. She leans on the bed for relief, and she states getting on her stomach gives her the most relief, but she has a lot of pain when getting up. She does use a heating pad as well. She reports that to help with the knee pain at night she has to have her leg up on the wall. She states she moves around a lot because sitting or standing for long periods will make it worse. Will occasionally have trouble bending down and she states the right leg has given out on her causing her to fall. She does have trouble going up/down stairs, she turns to the side when going down, and she also has breathing issues so when she goes up stairs to get inside she has to immediately get on oxygen. She reports inability to play with or lift her grandchildren and difficulty completing activities around her home for her son.  PERTINENT HISTORY:  See PMH above  PAIN:  Are you having pain? Yes:  NPRS scale: 7/10 currently, 10/10 at worst Pain location: Lower back and right leg/knee Pain description: Heavy, labor pains, constant Aggravating factors: Standing, sitting, activity Relieving factors: Lying on stomach, leaning on bed or counter, heat  PRECAUTIONS: Fall, patient reports she was told not to lift more than 10 lbs  FALLS:  Has patient fallen in last 6 months? Yes. Number of falls 3-4, right knee gives out or sharp pains could cause her to fall  PATIENT GOALS: Pain relief, get back to doing normal things   OBJECTIVE:  Note: Objective measures were completed at Evaluation unless otherwise noted. PATIENT SURVEYS:  Modified Oswestry 37/50 (74% disability)   SENSATION: Patient reports neuropathy of feet and hands  MUSCLE LENGTH: Unable to fully assess, severe increase in right lower back and leg pain when attempting to assess hamstring length  POSTURE:  Rounded shoulder posture, normal lumbar lordosis, weight shift toward left and patient frequently changing positions  while seated for comfort  PALPATION: Exquisite tenderness to right lower lumbar paraspinals, right gluteal and TFL region, right lateral thigh and knee; moderate tenderness to left lumbar paraspinals  Unable to assess lumbar mobility  LUMBAR ROM:   AROM eval  Flexion   Extension   Right lateral flexion   Left lateral flexion   Right rotation   Left rotation    (Blank rows = not  tested)  LOWER EXTREMITY ROM:    Limitations in right hip ROM due to pain and guarding, right knee flexion limited due to pain  LOWER EXTREMITY MMT:    MMT Right eval Left eval Rt / Lt 07/08/2023 Right 07/22/2023 Right 07/31/2023 Rt / Lt 08/06/2023  Hip flexion 3+ 4      Hip extension 2 3- 3 / 3   3 / 3+  Hip abduction 2     3 / 3+  Hip adduction        Hip internal rotation        Hip external rotation        Knee flexion 4- 4  4    Knee extension 4- 4+  4 4 4   Ankle dorsiflexion        Ankle plantarflexion        Ankle inversion        Ankle eversion         (Blank rows = not tested)  LUMBAR SPECIAL TESTS:  SLR positive on right  FUNCTIONAL TESTS:  Sit to stand: patient will difficulty standing from standard chair, requires UE for support with transferring from sit to stand 08/06/2023: patient able to stand from standard chair without support  GAIT: Assistive device utilized: None Level of assistance: Complete Independence Comments: Antalgic on right   TREATMENT [facility] Adult PT Treatment:                                                DATE: 08/14/2023 Recumbent bike L3 x 5 min to improve endurance and workload capacity KT tape for right proprioceptive patellar control STM and use of theragun for right lateral quad, ITB, and lateral hamstring Reclined SLR x 10 Sit to stand 3 x 10 Standing TKE with red 2 x 15 right Standing hip abduction with red at knees 2 x 10 each   PATIENT EDUCATION:  Education details: HEP Person educated: Patient Education method: Programmer, multimedia, Demonstration,  Tactile cues, Verbal cues Education comprehension: verbalized understanding, returned demonstration, verbal cues required, tactile cues required, and needs further education  HOME EXERCISE PROGRAM: Access Code: [email or web address]    ASSESSMENT: CLINICAL IMPRESSION: Patient tolerated therapy well with no adverse effects. Continued with KT tape for the right knee due to patient reporting shooting pain, and she does report good therapeutic benefit with taping. Performed STM to the lateral thigh to help reduce knee pain. Therapy continued progression of strengthening with good tolerance. She was able to increase reps with sit to stand exercise and add banded resistance. She did report lightning type pain in knee reduced with therapy. No changes made to her HEP this visit. Patient would benefit from continued skilled PT to progress mobility and strength in order to reduce pain and maximize functional ability.   Eval: Patient is a 48 y.o. female who was seen today for physical therapy evaluation and treatment for chronic lower back and right knee pain. Her symptoms do seem consistent with nonspecific lower back pain and right sided lumbar radiculopathy. Evaluation was limited this visit due to patient's high symptom severity and irritability level. She did become tearful during the evaluation. Trial of e-stim IFC combined with cold pack with patient reporting pain level reduction, and provided patient with information for purchasing home TENS unit for pain relief. Unable to provide patient with HEP this visit due  to inability to complete exercise without severe pain. Patient was provided extensive education on possible etiologies of her symptoms and treatments that could be utilized in therapy, and gradual progression for return to activity.   OBJECTIVE IMPAIRMENTS: Abnormal gait, decreased activity tolerance, decreased balance, decreased ROM, decreased strength, impaired flexibility, postural dysfunction, and pain.    ACTIVITY LIMITATIONS: carrying, lifting, bending, sitting, standing, squatting, sleeping, stairs, transfers, bed mobility, locomotion level, and caring for others  PARTICIPATION LIMITATIONS: meal prep, cleaning, laundry, driving, shopping, and community activity  PERSONAL FACTORS: Fitness, Past/current experiences, Time since onset of injury/illness/exacerbation, and 3+ comorbidities: see PMH above are also affecting patient's functional outcome.    GOALS: Goals reviewed with patient? Yes  SHORT TERM GOALS: Target date: 07/31/2023  Patient will be I with initial HEP in order to progress with therapy. Baseline: None provided at eval 07/28/2023: patient reports independence with HEP Goal status: MET  2.  Patient will report low back and right leg pain </= 7/10 at worst to reduce functional limitations Baseline: 10/10 pain at worst 07/29/2023: reports pain at worst still 10/10 08/06/2023: continues to report pain 10/10 at worst Goal status: ONGOING  3.  Patient will be able to perform sit to stand without difficulty or using UE for support to improve mobility and indicate improvement in LE strength Baseline: requires UE for support with standing from chair 07/29/2023: able to perform sit to stand without UE support but exhibits difficulty  08/06/2023: able to perform sit to stand without UE support but exhibits difficulty due to knee pain Goal status: ONGOING  LONG TERM GOALS: Target date: 08/28/2023  Patient will be I with final HEP to maintain progress from PT. Baseline: None provided at eval 08/06/2023: progressing Goal status: ONGOING  2.  Patient will report Modified Oswestry </= 25/50 (50% disability) in order to indicate an improvement in their functional status Baseline: 37/50 (74% disability) 08/06/2023: not assessed Goal status: DEFERRED  3.  Patient will demonstrate improvement in core and hip strength grossly >/= 4-/5 MMT in order to improve her activity tolerance with  household tasks Baseline: see limitations above 08/06/2023: see limitations above Goal status: ONGOING  4.  Patient will demonstrate right knee strength >/= 4+/5 MMT in order to improve stair negotiation and reduce fall risk due to right knee giving out Baseline: see limitations above 08/06/2023: see limitations above Goal status: ONGOING  5. Patient will report lower back and right leg pain </= 4/10 in order to reduce functional limitations  Baseline: 10/10 at worst  08/06/2023: continues to report pain 10/10 at worst  Goal status: ONGOING   PLAN: PT FREQUENCY: 1-2x/week  PT DURATION: 8 weeks  PLANNED INTERVENTIONS: 97164- PT Re-evaluation, 97110-Therapeutic exercises, 97530- Therapeutic activity, 97112- Neuromuscular re-education, 97535- Self Care, [id number]- Manual therapy, [id number]- Electrical stimulation (unattended), [id number]- Electrical stimulation (manual), Patient/Family education, Balance training, Taping, Dry Needling, Joint mobilization, Joint manipulation, Spinal manipulation, Spinal mobilization, Cryotherapy, and Moist heat.  PLAN FOR NEXT SESSION: Review HEP and progress PRN, continue with modalities for pain relief as needed, manual/TPDN for right lumbar and gluteal region, trial LAD, initiate gentle stretching and mobility for lumbar, initiate gentle core activation, hip and LE strengthening as tolerated    Leah Primus, PT, DPT, LAT, ATC 08/14/23  9:31 AM Phone: [phone] Fax: [phone]

## 2023-08-19 ENCOUNTER — Encounter: Payer: Self-pay | Admitting: Physical Therapy

## 2023-08-19 ENCOUNTER — Ambulatory Visit (INDEPENDENT_AMBULATORY_CARE_PROVIDER_SITE_OTHER): Admitting: Physical Therapy

## 2023-08-19 ENCOUNTER — Other Ambulatory Visit: Payer: Self-pay

## 2023-08-19 DIAGNOSIS — M6281 Muscle weakness (generalized): Secondary | ICD-10-CM

## 2023-08-19 DIAGNOSIS — M5459 Other low back pain: Secondary | ICD-10-CM

## 2023-08-19 DIAGNOSIS — M79604 Pain in right leg: Secondary | ICD-10-CM

## 2023-08-19 NOTE — Therapy (Signed)
 OUTPATIENT PHYSICAL THERAPY TREATMENT   Patient Name: Erica [name] MRN: [id number] DOB:1975/07/04, 48 y.o., female Today's Date: 08/19/2023   END OF SESSION:  PT End of Session - 08/19/23 0900     Visit Number 14    Number of Visits 17    Date for PT Re-Evaluation 08/28/23    Authorization Type [employer] MCR    Progress Note Due on Visit 20    PT Start Time [id number]    PT Stop Time 0930    PT Time Calculation (min) 44 min    Activity Tolerance Patient tolerated treatment well    Behavior During Therapy [facility] for tasks assessed/performed                          Past Medical History:  Diagnosis Date   Allergy     Anxiety    Apnea, sleep 05/04/2014   AHI 31/hr now on CPAP at 12cm H2O   Arthritis    Asthma    Asthma without status asthmaticus 01/19/2016   Axillary hidradenitis suppurativa 08/01/2011   Blood transfusion without reported diagnosis    Breast cancer (HCC) 2010   a. locally advanced left breast carcinoma diagnosed in 2010 and treated with neoadjuvant chemotherapy with docetaxel and cyclophosphamide as well as paclitaxel. This was followed by radiation therapy which was completed in 2011.   Chemotherapy-induced peripheral neuropathy (HCC) 12/09/2016   CHF (congestive heart failure) (HCC)    Chronic diastolic CHF (congestive heart failure) (HCC) 06/23/2013   Chronic kidney disease    Chronic pain in left foot 06/29/2018   Chronic stable angina (HCC) 08/22/2020   Cough variant asthma 09/27/2010   Followed in Pulmonary clinic/ [facility]/ [place]  Onset reported in childhood  - PFT's 01/31/2011 FEV1  1.76 (60% ) ratio 68 and no better p B2,  DLCO 70% -med review 09/25/2012 > [place]  drug assistance  paperwork given    - HFA 90% 11/20/2012 .  -Med calendar 12/07/12 , 04/06/2013 , 04/18/2014,, 08/30/2014 , 11/08/2014  - PFT's 01/12/2013  1.73 (65%)  Ratio 73 and no better p B2, DLCO 86% - trial off    Depression with somatization 06/15/2015   Dyspnea on exertion    a.  01/2013 Lexi MV: EF 54%, no ischemia/infarct;  b. 05/2013 Echo: EF 60-65%, no rwma, Gr 2 DD;  b.    Essential hypertension 10/09/2014   Estimated Creatinine Clearance: 82.4 mL/min (by C-G formula based on SCr of 0.98 mg/dL).   Fatty liver    Gastroparesis    GERD (gastroesophageal reflux disease)    Hyperglycemia 04/13/2011   Hypertension    Hypertensive heart disease 09/26/2015   Hypertriglyceridemia without hypercholesterolemia 06/15/2015   Framingham risk score is 1%    Idiopathic intracranial hypertension 10/21/2014   Impaired glucose tolerance 04/13/2011   Insomnia secondary to anxiety 04/05/2015   Lymphadenitis, chronic    restricted LEFT extremity   Malignant neoplasm of upper-outer quadrant of left breast in female, estrogen receptor negative (HCC) 01/07/2013   Stage III, Invasive Ductal, s/p partial Left Mastectomy/Lumpectomy (baseball sized) with Lymph Node dissection, had Chemo and RadRx     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   OSA (obstructive sleep apnea) 05/04/2014   AHI 31/hr now on CPAP at 12cm H2O   Oxygen deficiency    Uses 02 in CPAP   Persistent proteinuria 03/31/2018   Personal history of chemotherapy 2010   Personal history of radiation therapy 2010  Resistant hypertension 09/17/2011       Sleep apnea    SVT (supraventricular tachycardia) (HCC) 08/22/2020   Tibialis posterior tendinitis, left 07/15/2018   Injected in March 10, 2019   Ulcer    Upper airway cough syndrome 10/09/2014   Onset around 2012 with assoc pseudowheeze MRI 05/18/15 > Paranasal sinuses are clear - 11/17/2015 rec increase h1 to 2 hs to help with noct cough  - Allergy  profile 11/17/2015 >  Eos 0.1 /  IgE  80 POS RAST  to grass, ragweed, cedar tree  - d/c acei 03/06/2018  - flare end of Jan 2020 in setting of ? Uri/ poorly controlled GERD - flare mid Oct 2020 with uri / assoc secondary gerd   Vitamin D  deficiency disease 03/31/2018   Past Surgical History:  Procedure Laterality Date    BRAIN SURGERY     Shunt   BREAST LUMPECTOMY Left 09/2008   CARPAL TUNNEL RELEASE Left 2011   LYMPH NODE DISSECTION Left 2010   breast; 2 wks after breast lumpectomy   RENAL DENERVATION N/A 11/20/2022   Procedure: RENAL DENERVATION;  Surgeon: Wenona Hamilton, MD;  Location: [facility] INVASIVE CV LAB;  Service: Cardiovascular;  Laterality: N/A;   RIGHT/LEFT HEART CATH AND CORONARY ANGIOGRAPHY N/A 05/04/2019   Procedure: RIGHT/LEFT HEART CATH AND CORONARY ANGIOGRAPHY;  Surgeon: Mardell Shade, MD;  Location: [facility] INVASIVE CV LAB;  Service: Cardiovascular;  Laterality: N/A;  Renal injection preformed    RIGHT/LEFT HEART CATH AND CORONARY ANGIOGRAPHY N/A 10/25/2020   Procedure: RIGHT/LEFT HEART CATH AND CORONARY ANGIOGRAPHY;  Surgeon: Arleen Lacer, MD;  Location: [facility] INVASIVE CV LAB;  Service: Cardiovascular;  Laterality: N/A;   TENDON RELEASE Right 2012   "hand"   TUBAL LIGATION  05/11/2011   Procedure: POST PARTUM TUBAL LIGATION;  Surgeon: Onnie Bilis, MD;  Location: [facility];  Service: Gynecology;  Laterality: Bilateral;  Induced for HTN   Patient Active Problem List   Diagnosis Date Noted   Prediabetes 06/13/2023   Moderate episode of recurrent major depressive disorder (HCC) 02/04/2023   GAD (generalized anxiety disorder) 02/04/2023   Chronic respiratory failure with hypoxia (HCC) 12/31/2021   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   Coronary artery disease    Chronic stable angina (HCC) 08/22/2020   SVT (supraventricular tachycardia) (HCC) 08/22/2020   Palpitations 02/28/2020   Right ankle pain 04/14/2019   OSA (obstructive sleep apnea) 10/01/2018   Tibialis posterior tendinitis, left 07/15/2018   Chronic pain in left foot 06/29/2018   Vitamin D  deficiency disease 03/31/2018   Persistent proteinuria 03/31/2018   Hyperlipidemia LDL goal <100 03/30/2018   Chemotherapy-induced peripheral neuropathy (HCC) 12/09/2016   Asthma in adult, mild persistent, uncomplicated 01/19/2016   Hypertensive heart disease  09/26/2015   Depression with somatization 06/15/2015   Hypertriglyceridemia without hypercholesterolemia 06/15/2015   Hypokalemia 06/15/2015   Insomnia secondary to anxiety 04/05/2015   Idiopathic intracranial hypertension 10/21/2014   Upper airway cough syndrome 10/09/2014   Malignant hypertension 10/09/2014   Morbid obesity due to excess calories (HCC) 10/09/2014   Apnea, sleep 05/04/2014   Acute on chronic diastolic CHF (congestive heart failure), NYHA class 2 (HCC) 05/03/2014   DOE (dyspnea on exertion) 07/14/2013   Acute on chronic diastolic heart failure (HCC) 06/23/2013   Malignant neoplasm of upper-outer quadrant of left breast in female, estrogen receptor negative (HCC) 01/07/2013   Gastroparesis 01/04/2013   Resistant hypertension 09/17/2011   Axillary hidradenitis suppurativa 08/01/2011   Preventative health care 04/13/2011   Hyperglycemia 04/13/2011  GERD (gastroesophageal reflux disease) 03/18/2011   Cough variant asthma 09/27/2010    PCP: Nonda Bays, FNP  REFERRING PROVIDER: Syliva Even, MD  REFERRING DIAG: Lumbar radiculopathy  Rationale for Evaluation and Treatment: Rehabilitation  THERAPY DIAG:  Other low back pain  Pain in right leg  Muscle weakness (generalized)  ONSET DATE: August 2024   SUBJECTIVE:     SUBJECTIVE STATEMENT: Patient reports the shooting pain has stopped in the right knee but the consistent pain on the outside/back of the knee is still there. She does report a sensation on the outside of the right shoulder like something is crawling on her whenever she leans forward on her arms.   Eval: Patients reports she had renal denervation done in August 2024 and has been in pain ever since. Pain is located across the lower back, she was told it would hurt for 3-4 months but pain persisted in the lower back. The right knee pain started after in October or November, and it will keep her up at night. She states it feels heavy in her  lower back, like labor pain that never stops. The pain will start in the right lower back region and then will feel like lightning shooting down the right leg to the back and outside of the right knee, pain does not go past the knee. She leans on the bed for relief, and she states getting on her stomach gives her the most relief, but she has a lot of pain when getting up. She does use a heating pad as well. She reports that to help with the knee pain at night she has to have her leg up on the wall. She states she moves around a lot because sitting or standing for long periods will make it worse. Will occasionally have trouble bending down and she states the right leg has given out on her causing her to fall. She does have trouble going up/down stairs, she turns to the side when going down, and she also has breathing issues so when she goes up stairs to get inside she has to immediately get on oxygen. She reports inability to play with or lift her grandchildren and difficulty completing activities around her home for her son.  PERTINENT HISTORY:  See PMH above  PAIN:  Are you having pain? Yes:  NPRS scale: 4/10 currently, 10/10 at worst Pain location: Lower back and right leg/knee Pain description: Heavy, labor pains, constant Aggravating factors: Standing, sitting, activity Relieving factors: Lying on stomach, leaning on bed or counter, heat  PRECAUTIONS: Fall, patient reports she was told not to lift more than 10 lbs  FALLS:  Has patient fallen in last 6 months? Yes. Number of falls 3-4, right knee gives out or sharp pains could cause her to fall  PATIENT GOALS: Pain relief, get back to doing normal things   OBJECTIVE:  Note: Objective measures were completed at Evaluation unless otherwise noted. PATIENT SURVEYS:  Modified Oswestry 37/50 (74% disability)   SENSATION: Patient reports neuropathy of feet and hands  MUSCLE LENGTH: Unable to fully assess, severe increase in right lower back  and leg pain when attempting to assess hamstring length  POSTURE:  Rounded shoulder posture, normal lumbar lordosis, weight shift toward left and patient frequently changing positions while seated for comfort  PALPATION: Exquisite tenderness to right lower lumbar paraspinals, right gluteal and TFL region, right lateral thigh and knee; moderate tenderness to left lumbar paraspinals  Unable to assess lumbar mobility  LUMBAR ROM:   AROM eval  Flexion   Extension   Right lateral flexion   Left lateral flexion   Right rotation   Left rotation    (Blank rows = not tested)  LOWER EXTREMITY ROM:    Limitations in right hip ROM due to pain and guarding, right knee flexion limited due to pain  LOWER EXTREMITY MMT:    MMT Right eval Left eval Rt / Lt 07/08/2023 Right 07/22/2023 Right 07/31/2023 Rt / Lt 08/06/2023  Hip flexion 3+ 4      Hip extension 2 3- 3 / 3   3 / 3+  Hip abduction 2     3 / 3+  Hip adduction        Hip internal rotation        Hip external rotation        Knee flexion 4- 4  4    Knee extension 4- 4+  4 4 4   Ankle dorsiflexion        Ankle plantarflexion        Ankle inversion        Ankle eversion         (Blank rows = not tested)  LUMBAR SPECIAL TESTS:  SLR positive on right  FUNCTIONAL TESTS:  Sit to stand: patient will difficulty standing from standard chair, requires UE for support with transferring from sit to stand 08/06/2023: patient able to stand from standard chair without support  GAIT: Assistive device utilized: None Level of assistance: Complete Independence Comments: Antalgic on right   TREATMENT [facility] Adult PT Treatment:                                                DATE: 08/19/2023 Recumbent bike L3 x 6 min to improve endurance and workload capacity Row with green 2 x 10 Pallof press with L1 powerband 2 x 10 each Standing hip abduction with red at knees 2 x 10 each Standing hip extension 2 x 10 each Sit to stand 2 x 10 Standing  abdominal set with stability ball press 2 x 10 Standing posterior pelvic tilts at wall 2 x 10 Seated pelvic tilts x 10 Standing lumbar extension at wall x 10   PATIENT EDUCATION:  Education details: HEP update Person educated: Patient Education method: Explanation, Demonstration, Tactile cues, Verbal cues, Handouts Education comprehension: verbalized understanding, returned demonstration, verbal cues required, tactile cues required, and needs further education  HOME EXERCISE PROGRAM: Access Code: [email or web address]    ASSESSMENT: CLINICAL IMPRESSION: Patient tolerated therapy well with no adverse effects. Therapy focused on progressing her postural, core, hip, and LE strengthening. Performed more standing exercises this visit and worked on core/lumbar control with good tolerance. She does report a more extension based preference so provided her with lumbar extension exercise for home. Updated her HEP to progress her strengthening and mobility in more seated or standing positions. Patient would benefit from continued skilled PT to progress mobility and strength in order to reduce pain and maximize functional ability.   Eval: Patient is a 48 y.o. female who was seen today for physical therapy evaluation and treatment for chronic lower back and right knee pain. Her symptoms do seem consistent with nonspecific lower back pain and right sided lumbar radiculopathy. Evaluation was limited this visit due to patient's high symptom severity and irritability level. She did become  tearful during the evaluation. Trial of e-stim IFC combined with cold pack with patient reporting pain level reduction, and provided patient with information for purchasing home TENS unit for pain relief. Unable to provide patient with HEP this visit due to inability to complete exercise without severe pain. Patient was provided extensive education on possible etiologies of her symptoms and treatments that could be utilized in therapy, and  gradual progression for return to activity.   OBJECTIVE IMPAIRMENTS: Abnormal gait, decreased activity tolerance, decreased balance, decreased ROM, decreased strength, impaired flexibility, postural dysfunction, and pain.   ACTIVITY LIMITATIONS: carrying, lifting, bending, sitting, standing, squatting, sleeping, stairs, transfers, bed mobility, locomotion level, and caring for others  PARTICIPATION LIMITATIONS: meal prep, cleaning, laundry, driving, shopping, and community activity  PERSONAL FACTORS: Fitness, Past/current experiences, Time since onset of injury/illness/exacerbation, and 3+ comorbidities: see PMH above are also affecting patient's functional outcome.    GOALS: Goals reviewed with patient? Yes  SHORT TERM GOALS: Target date: 07/31/2023  Patient will be I with initial HEP in order to progress with therapy. Baseline: None provided at eval 07/28/2023: patient reports independence with HEP Goal status: MET  2.  Patient will report low back and right leg pain </= 7/10 at worst to reduce functional limitations Baseline: 10/10 pain at worst 07/29/2023: reports pain at worst still 10/10 08/06/2023: continues to report pain 10/10 at worst Goal status: ONGOING  3.  Patient will be able to perform sit to stand without difficulty or using UE for support to improve mobility and indicate improvement in LE strength Baseline: requires UE for support with standing from chair 07/29/2023: able to perform sit to stand without UE support but exhibits difficulty  08/06/2023: able to perform sit to stand without UE support but exhibits difficulty due to knee pain Goal status: ONGOING  LONG TERM GOALS: Target date: 08/28/2023  Patient will be I with final HEP to maintain progress from PT. Baseline: None provided at eval 08/06/2023: progressing Goal status: ONGOING  2.  Patient will report Modified Oswestry </= 25/50 (50% disability) in order to indicate an improvement in their functional  status Baseline: 37/50 (74% disability) 08/06/2023: not assessed Goal status: DEFERRED  3.  Patient will demonstrate improvement in core and hip strength grossly >/= 4-/5 MMT in order to improve her activity tolerance with household tasks Baseline: see limitations above 08/06/2023: see limitations above Goal status: ONGOING  4.  Patient will demonstrate right knee strength >/= 4+/5 MMT in order to improve stair negotiation and reduce fall risk due to right knee giving out Baseline: see limitations above 08/06/2023: see limitations above Goal status: ONGOING  5. Patient will report lower back and right leg pain </= 4/10 in order to reduce functional limitations  Baseline: 10/10 at worst  08/06/2023: continues to report pain 10/10 at worst  Goal status: ONGOING   PLAN: PT FREQUENCY: 1-2x/week  PT DURATION: 8 weeks  PLANNED INTERVENTIONS: 97164- PT Re-evaluation, 97110-Therapeutic exercises, 97530- Therapeutic activity, 97112- Neuromuscular re-education, 97535- Self Care, 16109- Manual therapy, [id number]- Electrical stimulation (unattended), [phone]- Electrical stimulation (manual), Patient/Family education, Balance training, Taping, Dry Needling, Joint mobilization, Joint manipulation, Spinal manipulation, Spinal mobilization, Cryotherapy, and Moist heat.  PLAN FOR NEXT SESSION: Review HEP and progress PRN, continue with modalities for pain relief as needed, manual/TPDN for right lumbar and gluteal region, trial LAD, initiate gentle stretching and mobility for lumbar, initiate gentle core activation, hip and LE strengthening as tolerated    Leah Primus, PT, DPT, LAT, ATC 08/19/23  9:49  AM Phone: [phone] Fax: [phone]

## 2023-08-19 NOTE — Patient Instructions (Signed)
 Access Code: [email or web address] URL: [email or web address] Date: 08/19/2023 Prepared by: Leah Primus  Exercises - Seated Hip Abduction with Resistance  - 1-2 x daily - 2 sets - 10 reps - Seated Pelvic Tilt  - 1-2 x daily - 2 sets - 10 reps - Seated Long Arc Quad  - 1-2 x daily - 2 sets - 10 reps - Sit to Stand  - 1-2 x daily - 2 sets - 10 reps - Standing Hip Abduction with Counter Support  - 1-2 x daily - 2 sets - 10 reps - Standing Hip Extension with Counter Support  - 1-2 x daily - 2 sets - 10 reps - Standing Lumbar Extension at Wall - Forearms  - 1-2 x daily - 2 sets - 10 reps

## 2023-08-21 NOTE — Therapy (Signed)
 OUTPATIENT PHYSICAL THERAPY TREATMENT   Patient Name: Erica [name] MRN: [id number] DOB:April 22, 1975, 48 y.o., female Today's Date: 08/22/2023   END OF SESSION:  PT End of Session - 08/22/23 0908     Visit Number 15    Number of Visits 17    Date for PT Re-Evaluation 08/28/23    Authorization Type U[employer] MCR    Progress Note Due on Visit 20    PT Start Time 0845    PT Stop Time 0930    PT Time Calculation (min) 45 min    Activity Tolerance Patient tolerated treatment well    Behavior During Therapy [facility] for tasks assessed/performed                           Past Medical History:  Diagnosis Date   Allergy     Anxiety    Apnea, sleep 05/04/2014   AHI 31/hr now on CPAP at 12cm H2O   Arthritis    Asthma    Asthma without status asthmaticus 01/19/2016   Axillary hidradenitis suppurativa 08/01/2011   Blood transfusion without reported diagnosis    Breast cancer (HCC) 2010   a. locally advanced left breast carcinoma diagnosed in 2010 and treated with neoadjuvant chemotherapy with docetaxel and cyclophosphamide as well as paclitaxel. This was followed by radiation therapy which was completed in 2011.   Chemotherapy-induced peripheral neuropathy (HCC) 12/09/2016   CHF (congestive heart failure) (HCC)    Chronic diastolic CHF (congestive heart failure) (HCC) 06/23/2013   Chronic kidney disease    Chronic pain in left foot 06/29/2018   Chronic stable angina (HCC) 08/22/2020   Cough variant asthma 09/27/2010   Followed in Pulmonary clinic/ [facility]/ [place]  Onset reported in childhood  - PFT's 01/31/2011 FEV1  1.76 (60% ) ratio 68 and no better p B2,  DLCO 70% -med review 09/25/2012 > [place]  drug assistance  paperwork given    - HFA 90% 11/20/2012 .  -Med calendar 12/07/12 , 04/06/2013 , 04/18/2014,, 08/30/2014 , 11/08/2014  - PFT's 01/12/2013  1.73 (65%)  Ratio 73 and no better p B2, DLCO 86% - trial off    Depression with somatization 06/15/2015   Dyspnea on exertion     a. 01/2013 Lexi MV: EF 54%, no ischemia/infarct;  b. 05/2013 Echo: EF 60-65%, no rwma, Gr 2 DD;  b.    Essential hypertension 10/09/2014   Estimated Creatinine Clearance: 82.4 mL/min (by C-G formula based on SCr of 0.98 mg/dL).   Fatty liver    Gastroparesis    GERD (gastroesophageal reflux disease)    Hyperglycemia 04/13/2011   Hypertension    Hypertensive heart disease 09/26/2015   Hypertriglyceridemia without hypercholesterolemia 06/15/2015   Framingham risk score is 1%    Idiopathic intracranial hypertension 10/21/2014   Impaired glucose tolerance 04/13/2011   Insomnia secondary to anxiety 04/05/2015   Lymphadenitis, chronic    restricted LEFT extremity   Malignant neoplasm of upper-outer quadrant of left breast in female, estrogen receptor negative (HCC) 01/07/2013   Stage III, Invasive Ductal, s/p partial Left Mastectomy/Lumpectomy (baseball sized) with Lymph Node dissection, had Chemo and RadRx     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   OSA (obstructive sleep apnea) 05/04/2014   AHI 31/hr now on CPAP at 12cm H2O   Oxygen deficiency    Uses 02 in CPAP   Persistent proteinuria 03/31/2018   Personal history of chemotherapy 2010   Personal history of radiation therapy 2010  Resistant hypertension 09/17/2011       Sleep apnea    SVT (supraventricular tachycardia) (HCC) 08/22/2020   Tibialis posterior tendinitis, left 07/15/2018   Injected in March 10, 2019   Ulcer    Upper airway cough syndrome 10/09/2014   Onset around 2012 with assoc pseudowheeze MRI 05/18/15 > Paranasal sinuses are clear - 11/17/2015 rec increase h1 to 2 hs to help with noct cough  - Allergy  profile 11/17/2015 >  Eos 0.1 /  IgE  80 POS RAST  to grass, ragweed, cedar tree  - d/c acei 03/06/2018  - flare end of Jan 2020 in setting of ? Uri/ poorly controlled GERD - flare mid Oct 2020 with uri / assoc secondary gerd   Vitamin D  deficiency disease 03/31/2018   Past Surgical History:  Procedure Laterality Date    BRAIN SURGERY     Shunt   BREAST LUMPECTOMY Left 09/2008   CARPAL TUNNEL RELEASE Left 2011   LYMPH NODE DISSECTION Left 2010   breast; 2 wks after breast lumpectomy   RENAL DENERVATION N/A 11/20/2022   Procedure: RENAL DENERVATION;  Surgeon: Wenona Hamilton, MD;  Location: [facility] INVASIVE CV LAB;  Service: Cardiovascular;  Laterality: N/A;   RIGHT/LEFT HEART CATH AND CORONARY ANGIOGRAPHY N/A 05/04/2019   Procedure: RIGHT/LEFT HEART CATH AND CORONARY ANGIOGRAPHY;  Surgeon: Mardell Shade, MD;  Location: [facility] INVASIVE CV LAB;  Service: Cardiovascular;  Laterality: N/A;  Renal injection preformed    RIGHT/LEFT HEART CATH AND CORONARY ANGIOGRAPHY N/A 10/25/2020   Procedure: RIGHT/LEFT HEART CATH AND CORONARY ANGIOGRAPHY;  Surgeon: Arleen Lacer, MD;  Location: [facility] INVASIVE CV LAB;  Service: Cardiovascular;  Laterality: N/A;   TENDON RELEASE Right 2012   "hand"   TUBAL LIGATION  05/11/2011   Procedure: POST PARTUM TUBAL LIGATION;  Surgeon: Onnie Bilis, MD;  Location: [facility];  Service: Gynecology;  Laterality: Bilateral;  Induced for HTN   Patient Active Problem List   Diagnosis Date Noted   Prediabetes 06/13/2023   Moderate episode of recurrent major depressive disorder (HCC) 02/04/2023   GAD (generalized anxiety disorder) 02/04/2023   Chronic respiratory failure with hypoxia (HCC) 12/31/2021   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   Coronary artery disease    Chronic stable angina (HCC) 08/22/2020   SVT (supraventricular tachycardia) (HCC) 08/22/2020   Palpitations 02/28/2020   Right ankle pain 04/14/2019   OSA (obstructive sleep apnea) 10/01/2018   Tibialis posterior tendinitis, left 07/15/2018   Chronic pain in left foot 06/29/2018   Vitamin D  deficiency disease 03/31/2018   Persistent proteinuria 03/31/2018   Hyperlipidemia LDL goal <100 03/30/2018   Chemotherapy-induced peripheral neuropathy (HCC) 12/09/2016   Asthma in adult, mild persistent, uncomplicated 01/19/2016   Hypertensive heart  disease 09/26/2015   Depression with somatization 06/15/2015   Hypertriglyceridemia without hypercholesterolemia 06/15/2015   Hypokalemia 06/15/2015   Insomnia secondary to anxiety 04/05/2015   Idiopathic intracranial hypertension 10/21/2014   Upper airway cough syndrome 10/09/2014   Malignant hypertension 10/09/2014   Morbid obesity due to excess calories (HCC) 10/09/2014   Apnea, sleep 05/04/2014   Acute on chronic diastolic CHF (congestive heart failure), NYHA class 2 (HCC) 05/03/2014   DOE (dyspnea on exertion) 07/14/2013   Acute on chronic diastolic heart failure (HCC) 06/23/2013   Malignant neoplasm of upper-outer quadrant of left breast in female, estrogen receptor negative (HCC) 01/07/2013   Gastroparesis 01/04/2013   Resistant hypertension 09/17/2011   Axillary hidradenitis suppurativa 08/01/2011   Preventative health care 04/13/2011   Hyperglycemia 04/13/2011  GERD (gastroesophageal reflux disease) 03/18/2011   Cough variant asthma 09/27/2010    PCP: Nonda Bays, FNP  REFERRING PROVIDER: Syliva Even, MD  REFERRING DIAG: Lumbar radiculopathy  Rationale for Evaluation and Treatment: Rehabilitation  THERAPY DIAG:  Other low back pain  Pain in right leg  Muscle weakness (generalized)  ONSET DATE: August 2024   SUBJECTIVE:     SUBJECTIVE STATEMENT: Patient reports she was having more low back pain yesterday for no apparent reason but is feeling better today.  Eval: Patients reports she had renal denervation done in August 2024 and has been in pain ever since. Pain is located across the lower back, she was told it would hurt for 3-4 months but pain persisted in the lower back. The right knee pain started after in October or November, and it will keep her up at night. She states it feels heavy in her lower back, like labor pain that never stops. The pain will start in the right lower back region and then will feel like lightning shooting down the right leg  to the back and outside of the right knee, pain does not go past the knee. She leans on the bed for relief, and she states getting on her stomach gives her the most relief, but she has a lot of pain when getting up. She does use a heating pad as well. She reports that to help with the knee pain at night she has to have her leg up on the wall. She states she moves around a lot because sitting or standing for long periods will make it worse. Will occasionally have trouble bending down and she states the right leg has given out on her causing her to fall. She does have trouble going up/down stairs, she turns to the side when going down, and she also has breathing issues so when she goes up stairs to get inside she has to immediately get on oxygen. She reports inability to play with or lift her grandchildren and difficulty completing activities around her home for her son.  PERTINENT HISTORY:  See PMH above  PAIN:  Are you having pain? Yes:  NPRS scale: 4/10 currently, 10/10 at worst Pain location: Lower back and right leg/knee Pain description: Heavy, labor pains, constant Aggravating factors: Standing, sitting, activity Relieving factors: Lying on stomach, leaning on bed or counter, heat  PRECAUTIONS: Fall, patient reports she was told not to lift more than 10 lbs  FALLS:  Has patient fallen in last 6 months? Yes. Number of falls 3-4, right knee gives out or sharp pains could cause her to fall  PATIENT GOALS: Pain relief, get back to doing normal things   OBJECTIVE:  Note: Objective measures were completed at Evaluation unless otherwise noted. PATIENT SURVEYS:  Modified Oswestry 37/50 (74% disability)   SENSATION: Patient reports neuropathy of feet and hands  MUSCLE LENGTH: Unable to fully assess, severe increase in right lower back and leg pain when attempting to assess hamstring length  POSTURE:  Rounded shoulder posture, normal lumbar lordosis, weight shift toward left and patient  frequently changing positions while seated for comfort  PALPATION: Exquisite tenderness to right lower lumbar paraspinals, right gluteal and TFL region, right lateral thigh and knee; moderate tenderness to left lumbar paraspinals  Unable to assess lumbar mobility  LUMBAR ROM:   AROM eval  Flexion   Extension   Right lateral flexion   Left lateral flexion   Right rotation   Left rotation    (  Blank rows = not tested)  LOWER EXTREMITY ROM:    Limitations in right hip ROM due to pain and guarding, right knee flexion limited due to pain  LOWER EXTREMITY MMT:    MMT Right eval Left eval Rt / Lt 07/08/2023 Right 07/22/2023 Right 07/31/2023 Rt / Lt 08/06/2023  Hip flexion 3+ 4      Hip extension 2 3- 3 / 3   3 / 3+  Hip abduction 2     3 / 3+  Hip adduction        Hip internal rotation        Hip external rotation        Knee flexion 4- 4  4    Knee extension 4- 4+  4 4 4   Ankle dorsiflexion        Ankle plantarflexion        Ankle inversion        Ankle eversion         (Blank rows = not tested)  LUMBAR SPECIAL TESTS:  SLR positive on right  FUNCTIONAL TESTS:  Sit to stand: patient will difficulty standing from standard chair, requires UE for support with transferring from sit to stand 08/06/2023: patient able to stand from standard chair without support  GAIT: Assistive device utilized: None Level of assistance: Complete Independence Comments: Antalgic on right   TREATMENT [facility] Adult PT Treatment:                                                DATE: 08/22/2023 Recumbent bike L3 x 5 min to improve endurance and workload capacity Sit to stand 2 x 10 Seated pelvic tilts x 10 LAQ with 4# 2 x 10 Standing lumbar extension at wall x 10 KT tape to right knee for proprioceptive patellar control  Trigger Point Dry Needling  Initial Treatment: Pt instructed on Dry Needling rational, procedures, and possible side effects. Pt instructed to expect mild to moderate muscle  soreness later in the day and/or into the next day.  Pt instructed in methods to reduce muscle soreness. Pt instructed to continue prescribed HEP. Because Dry Needling was performed over or adjacent to a lung field, pt was educated on S/S of pneumothorax and to seek immediate medical attention should they occur.  Patient was educated on signs and symptoms of infection and other risk factors and advised to seek medical attention should they occur.  Patient verbalized understanding of these instructions and education.   Patient Verbal Consent Given: Yes Education Handout Provided: Yes Muscles Treated: Right L3-4 multifidi, right glute max, right lateral distal hamstring Electrical Stimulation Performed: No Treatment Response/Outcome: Twitch response   PATIENT EDUCATION:  Education details: HEP, TPDN Person educated: Patient Education method: Explanation, Demonstration, Tactile cues, Verbal cues Education comprehension: verbalized understanding, returned demonstration, verbal cues required, tactile cues required, and needs further education  HOME EXERCISE PROGRAM: Access Code: [email or web address]    ASSESSMENT: CLINICAL IMPRESSION: Patient tolerated therapy well with no adverse effects. Performed TPDN for the right lumbar spine, glute, and lateral hamstring with good therapeutic benefit. She did report muscular soreness following the dry needling treatment. She did did report feeling less posterior and lateral knee pain when performing LAQ exercise. Continued with KT tape for right knee as patient reports this is beneficial. No changes made to HEP this visit. Patient would benefit from  continued skilled PT to progress mobility and strength in order to reduce pain and maximize functional ability.   Eval: Patient is a 48 y.o. female who was seen today for physical therapy evaluation and treatment for chronic lower back and right knee pain. Her symptoms do seem consistent with nonspecific lower back pain  and right sided lumbar radiculopathy. Evaluation was limited this visit due to patient's high symptom severity and irritability level. She did become tearful during the evaluation. Trial of e-stim IFC combined with cold pack with patient reporting pain level reduction, and provided patient with information for purchasing home TENS unit for pain relief. Unable to provide patient with HEP this visit due to inability to complete exercise without severe pain. Patient was provided extensive education on possible etiologies of her symptoms and treatments that could be utilized in therapy, and gradual progression for return to activity.   OBJECTIVE IMPAIRMENTS: Abnormal gait, decreased activity tolerance, decreased balance, decreased ROM, decreased strength, impaired flexibility, postural dysfunction, and pain.   ACTIVITY LIMITATIONS: carrying, lifting, bending, sitting, standing, squatting, sleeping, stairs, transfers, bed mobility, locomotion level, and caring for others  PARTICIPATION LIMITATIONS: meal prep, cleaning, laundry, driving, shopping, and community activity  PERSONAL FACTORS: Fitness, Past/current experiences, Time since onset of injury/illness/exacerbation, and 3+ comorbidities: see PMH above are also affecting patient's functional outcome.    GOALS: Goals reviewed with patient? Yes  SHORT TERM GOALS: Target date: 07/31/2023  Patient will be I with initial HEP in order to progress with therapy. Baseline: None provided at eval 07/28/2023: patient reports independence with HEP Goal status: MET  2.  Patient will report low back and right leg pain </= 7/10 at worst to reduce functional limitations Baseline: 10/10 pain at worst 07/29/2023: reports pain at worst still 10/10 08/06/2023: continues to report pain 10/10 at worst Goal status: ONGOING  3.  Patient will be able to perform sit to stand without difficulty or using UE for support to improve mobility and indicate improvement in LE  strength Baseline: requires UE for support with standing from chair 07/29/2023: able to perform sit to stand without UE support but exhibits difficulty  08/06/2023: able to perform sit to stand without UE support but exhibits difficulty due to knee pain Goal status: ONGOING  LONG TERM GOALS: Target date: 08/28/2023  Patient will be I with final HEP to maintain progress from PT. Baseline: None provided at eval 08/06/2023: progressing Goal status: ONGOING  2.  Patient will report Modified Oswestry </= 25/50 (50% disability) in order to indicate an improvement in their functional status Baseline: 37/50 (74% disability) 08/06/2023: not assessed Goal status: DEFERRED  3.  Patient will demonstrate improvement in core and hip strength grossly >/= 4-/5 MMT in order to improve her activity tolerance with household tasks Baseline: see limitations above 08/06/2023: see limitations above Goal status: ONGOING  4.  Patient will demonstrate right knee strength >/= 4+/5 MMT in order to improve stair negotiation and reduce fall risk due to right knee giving out Baseline: see limitations above 08/06/2023: see limitations above Goal status: ONGOING  5. Patient will report lower back and right leg pain </= 4/10 in order to reduce functional limitations  Baseline: 10/10 at worst  08/06/2023: continues to report pain 10/10 at worst  Goal status: ONGOING   PLAN: PT FREQUENCY: 1-2x/week  PT DURATION: 8 weeks  PLANNED INTERVENTIONS: 97164- PT Re-evaluation, 97110-Therapeutic exercises, 97530- Therapeutic activity, 97112- Neuromuscular re-education, 97535- Self Care, [id number]- Manual therapy, [id number]- Electrical stimulation (unattended), 29562- Electrical stimulation (  manual), Patient/Family education, Balance training, Taping, Dry Needling, Joint mobilization, Joint manipulation, Spinal manipulation, Spinal mobilization, Cryotherapy, and Moist heat.  PLAN FOR NEXT SESSION: Review HEP and progress PRN, continue with  modalities for pain relief as needed, manual/TPDN for right lumbar and gluteal region, trial LAD, initiate gentle stretching and mobility for lumbar, initiate gentle core activation, hip and LE strengthening as tolerated    Leah Primus, PT, DPT, LAT, ATC 08/22/23  9:35 AM Phone: [phone] Fax: [phone]

## 2023-08-22 ENCOUNTER — Other Ambulatory Visit: Payer: Self-pay

## 2023-08-22 ENCOUNTER — Ambulatory Visit (INDEPENDENT_AMBULATORY_CARE_PROVIDER_SITE_OTHER): Admitting: Physical Therapy

## 2023-08-22 ENCOUNTER — Encounter: Payer: Self-pay | Admitting: Physical Therapy

## 2023-08-22 DIAGNOSIS — M5459 Other low back pain: Secondary | ICD-10-CM

## 2023-08-22 DIAGNOSIS — M6281 Muscle weakness (generalized): Secondary | ICD-10-CM

## 2023-08-22 DIAGNOSIS — M79604 Pain in right leg: Secondary | ICD-10-CM

## 2023-08-26 ENCOUNTER — Encounter: Payer: Self-pay | Admitting: Physical Therapy

## 2023-08-26 ENCOUNTER — Other Ambulatory Visit: Payer: Self-pay

## 2023-08-26 ENCOUNTER — Other Ambulatory Visit (HOSPITAL_BASED_OUTPATIENT_CLINIC_OR_DEPARTMENT_OTHER): Payer: Self-pay | Admitting: Family

## 2023-08-26 ENCOUNTER — Inpatient Hospital Stay: Payer: Medicare Other

## 2023-08-26 ENCOUNTER — Ambulatory Visit (INDEPENDENT_AMBULATORY_CARE_PROVIDER_SITE_OTHER): Admitting: Physical Therapy

## 2023-08-26 DIAGNOSIS — E785 Hyperlipidemia, unspecified: Secondary | ICD-10-CM

## 2023-08-26 DIAGNOSIS — M6281 Muscle weakness (generalized): Secondary | ICD-10-CM

## 2023-08-26 DIAGNOSIS — M79604 Pain in right leg: Secondary | ICD-10-CM

## 2023-08-26 DIAGNOSIS — I25118 Atherosclerotic heart disease of native coronary artery with other forms of angina pectoris: Secondary | ICD-10-CM

## 2023-08-26 DIAGNOSIS — M5459 Other low back pain: Secondary | ICD-10-CM | POA: Diagnosis not present

## 2023-08-26 DIAGNOSIS — I2089 Other forms of angina pectoris: Secondary | ICD-10-CM

## 2023-08-26 NOTE — Therapy (Signed)
 OUTPATIENT PHYSICAL THERAPY TREATMENT   Patient Name: Erica [name] MRN: [id number] DOB:[birth year], 48 y.o., female Today's Date: 08/26/2023   END OF SESSION:  PT End of Session - 08/26/23 1027     Visit Number 16    Number of Visits 17    Date for PT Re-Evaluation 08/28/23    Authorization Type UHC MCR    Progress Note Due on Visit 20    PT Start Time 1017    PT Stop Time 1100    PT Time Calculation (min) 43 min    Activity Tolerance Patient tolerated treatment well    Behavior During Therapy WFL for tasks assessed/performed                            Past Medical History:  Diagnosis Date   Allergy     Anxiety    Apnea, sleep 05/04/2014   AHI 31/hr now on CPAP at 12cm H2O   Arthritis    Asthma    Asthma without status asthmaticus 01/19/2016   Axillary hidradenitis suppurativa 08/01/2011   Blood transfusion without reported diagnosis    Breast cancer (HCC) 2010   a. locally advanced left breast carcinoma diagnosed in 2010 and treated with neoadjuvant chemotherapy with docetaxel and cyclophosphamide as well as paclitaxel. This was followed by radiation therapy which was completed in 2011.   Chemotherapy-induced peripheral neuropathy (HCC) 12/09/2016   CHF (congestive heart failure) (HCC)    Chronic diastolic CHF (congestive heart failure) (HCC) 06/23/2013   Chronic kidney disease    Chronic pain in left foot 06/29/2018   Chronic stable angina (HCC) 08/22/2020   Cough variant asthma 09/27/2010   Followed in Pulmonary clinic/ [facility]/ [facility]  Onset reported in childhood  - PFT's 01/31/2011 FEV1  1.76 (60% ) ratio 68 and no better p B2,  DLCO 70% -med review 09/25/2012 > [facility]  drug assistance  paperwork given    - HFA 90% 11/20/2012 .  -Med calendar 12/07/12 , 04/06/2013 , 04/18/2014,, 08/30/2014 , 11/08/2014  - PFT's 01/12/2013  1.73 (65%)  Ratio 73 and no better p B2, DLCO 86% - trial off    Depression with somatization 06/15/2015   Dyspnea on exertion     a. 01/2013 Lexi MV: EF 54%, no ischemia/infarct;  b. 05/2013 Echo: EF 60-65%, no rwma, Gr 2 DD;  b.    Essential hypertension 10/09/2014   Estimated Creatinine Clearance: 82.4 mL/min (by C-G formula based on SCr of 0.98 mg/dL).   Fatty liver    Gastroparesis    GERD (gastroesophageal reflux disease)    Hyperglycemia 04/13/2011   Hypertension    Hypertensive heart disease 09/26/2015   Hypertriglyceridemia without hypercholesterolemia 06/15/2015   Framingham risk score is 1%    Idiopathic intracranial hypertension 10/21/2014   Impaired glucose tolerance 04/13/2011   Insomnia secondary to anxiety 04/05/2015   Lymphadenitis, chronic    restricted LEFT extremity   Malignant neoplasm of upper-outer quadrant of left breast in female, estrogen receptor negative (HCC) 01/07/2013   Stage III, Invasive Ductal, s/p partial Left Mastectomy/Lumpectomy (baseball sized) with Lymph Node dissection, had Chemo and RadRx     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   OSA (obstructive sleep apnea) 05/04/2014   AHI 31/hr now on CPAP at 12cm H2O   Oxygen deficiency    Uses 02 in CPAP   Persistent proteinuria 03/31/2018   Personal history of chemotherapy 2010   Personal history of radiation therapy 2010  Resistant hypertension 09/17/2011       Sleep apnea    SVT (supraventricular tachycardia) (HCC) 08/22/2020   Tibialis posterior tendinitis, left 07/15/2018   Injected in March 10, 2019   Ulcer    Upper airway cough syndrome 10/09/2014   Onset around 2012 with assoc pseudowheeze MRI 05/18/15 > Paranasal sinuses are clear - 11/17/2015 rec increase h1 to 2 hs to help with noct cough  - Allergy  profile 11/17/2015 >  Eos 0.1 /  IgE  80 POS RAST  to grass, ragweed, cedar tree  - d/c acei 03/06/2018  - flare end of Jan 2020 in setting of ? Uri/ poorly controlled GERD - flare mid Oct 2020 with uri / assoc secondary gerd   Vitamin D  deficiency disease 03/31/2018   Past Surgical History:  Procedure Laterality  Date   BRAIN SURGERY     Shunt   BREAST LUMPECTOMY Left 09/2008   CARPAL TUNNEL RELEASE Left 2011   LYMPH NODE DISSECTION Left 2010   breast; 2 wks after breast lumpectomy   RENAL DENERVATION N/A 11/20/2022   Procedure: RENAL DENERVATION;  Surgeon: Wenona Hamilton, MD;  Location: [facility] INVASIVE CV LAB;  Service: Cardiovascular;  Laterality: N/A;   RIGHT/LEFT HEART CATH AND CORONARY ANGIOGRAPHY N/A 05/04/2019   Procedure: RIGHT/LEFT HEART CATH AND CORONARY ANGIOGRAPHY;  Surgeon: Mardell Shade, MD;  Location: [facility] INV[facility] CV [facility];  Service: Cardiovascular;  Laterality: N/A;  Renal injection preformed    RIGHT/LEFT HEART CATH AND CORONARY ANGIOGRAPHY N/A 10/25/2020   Procedure: RIGHT/LEFT HEART CATH AND CORONARY ANGIOGRAPHY;  Surgeon: Arleen Lacer, MD;  Location: [facility] INVASIVE CV LAB;  Service: Cardiovascular;  Laterality: N/A;   TENDON RELEASE Right 2012   "hand"   TUBAL LIGATION  05/11/2011   Procedure: POST PARTUM TUBAL LIGATION;  Surgeon: Onnie Bilis, MD;  Location: [facility];  Service: Gynecology;  Laterality: Bilateral;  Induced for HTN   Patient Active Problem List   Diagnosis Date Noted   Prediabetes 06/13/2023   Moderate episode of recurrent major depressive disorder (HCC) 02/04/2023   GAD (generalized anxiety disorder) 02/04/2023   Chronic respiratory failure with hypoxia (HCC) 12/31/2021   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   Coronary artery disease    Chronic stable angina (HCC) 08/22/2020   SVT (supraventricular tachycardia) (HCC) 08/22/2020   Palpitations 02/28/2020   Right ankle pain 04/14/2019   OSA (obstructive sleep apnea) 10/01/2018   Tibialis posterior tendinitis, left 07/15/2018   Chronic pain in left foot 06/29/2018   Vitamin D  deficiency disease 03/31/2018   Persistent proteinuria 03/31/2018   Hyperlipidemia LDL goal <100 03/30/2018   Chemotherapy-induced peripheral neuropathy (HCC) 12/09/2016   Asthma in adult, mild persistent, uncomplicated 01/19/2016   Hypertensive heart  disease 09/26/2015   Depression with somatization 06/15/2015   Hypertriglyceridemia without hypercholesterolemia 06/15/2015   Hypokalemia 06/15/2015   Insomnia secondary to anxiety 04/05/2015   Idiopathic intracranial hypertension 10/21/2014   Upper airway cough syndrome 10/09/2014   Malignant hypertension 10/09/2014   Morbid obesity due to excess calories (HCC) 10/09/2014   Apnea, sleep 05/04/2014   Acute on chronic diastolic CHF (congestive heart failure), NYHA class 2 (HCC) 05/03/2014   DOE (dyspnea on exertion) 07/14/2013   Acute on chronic diastolic heart failure (HCC) 06/23/2013   Malignant neoplasm of upper-outer quadrant of left breast in female, estrogen receptor negative (HCC) 01/07/2013   Gastroparesis 01/04/2013   Resistant hypertension 09/17/2011   Axillary hidradenitis suppurativa 08/01/2011   Preventative health care 04/13/2011   Hyperglycemia 04/13/2011  GERD (gastroesophageal reflux disease) 03/18/2011   Cough variant asthma 09/27/2010    PCP: Nonda Bays, FNP  REFERRING PROVIDER: Syliva Even, MD  REFERRING DIAG: Lumbar radiculopathy  Rationale for Evaluation and Treatment: Rehabilitation  THERAPY DIAG:  Other low back pain  Pain in right leg  Muscle weakness (generalized)  ONSET DATE: August 2024   SUBJECTIVE:     SUBJECTIVE STATEMENT: Patient reports she was sore following last visit especially in her back and right butt, but her knee wasn't as tight on the back.   Eval: Patients reports she had renal denervation done in August 2024 and has been in pain ever since. Pain is located across the lower back, she was told it would hurt for 3-4 months but pain persisted in the lower back. The right knee pain started after in October or November, and it will keep her up at night. She states it feels heavy in her lower back, like labor pain that never stops. The pain will start in the right lower back region and then will feel like lightning  shooting down the right leg to the back and outside of the right knee, pain does not go past the knee. She leans on the bed for relief, and she states getting on her stomach gives her the most relief, but she has a lot of pain when getting up. She does use a heating pad as well. She reports that to help with the knee pain at night she has to have her leg up on the wall. She states she moves around a lot because sitting or standing for long periods will make it worse. Will occasionally have trouble bending down and she states the right leg has given out on her causing her to fall. She does have trouble going up/down stairs, she turns to the side when going down, and she also has breathing issues so when she goes up stairs to get inside she has to immediately get on oxygen. She reports inability to play with or lift her grandchildren and difficulty completing activities around her home for her son.  PERTINENT HISTORY:  See PMH above  PAIN:  Are you having pain? Yes:  NPRS scale: 4/10 currently, 10/10 at worst Pain location: Lower back and right leg/knee Pain description: Heavy, labor pains, constant Aggravating factors: Standing, sitting, activity Relieving factors: Lying on stomach, leaning on bed or counter, heat  PRECAUTIONS: Fall, patient reports she was told not to lift more than 10 lbs  FALLS:  Has patient fallen in last 6 months? Yes. Number of falls 3-4, right knee gives out or sharp pains could cause her to fall  PATIENT GOALS: Pain relief, get back to doing normal things   OBJECTIVE:  Note: Objective measures were completed at Evaluation unless otherwise noted. PATIENT SURVEYS:  Modified Oswestry 37/50 (74% disability)   SENSATION: Patient reports neuropathy of feet and hands  MUSCLE LENGTH: Unable to fully assess, severe increase in right lower back and leg pain when attempting to assess hamstring length  POSTURE:  Rounded shoulder posture, normal lumbar lordosis, weight  shift toward left and patient frequently changing positions while seated for comfort  PALPATION: Exquisite tenderness to right lower lumbar paraspinals, right gluteal and TFL region, right lateral thigh and knee; moderate tenderness to left lumbar paraspinals  Unable to assess lumbar mobility  LUMBAR ROM:   AROM eval  Flexion   Extension   Right lateral flexion   Left lateral flexion   Right rotation  Left rotation    (Blank rows = not tested)  LOWER EXTREMITY ROM:    Limitations in right hip ROM due to pain and guarding, right knee flexion limited due to pain  LOWER EXTREMITY MMT:    MMT Right eval Left eval Rt / Lt 07/08/2023 Right 07/22/2023 Right 07/31/2023 Rt / Lt 08/06/2023  Hip flexion 3+ 4      Hip extension 2 3- 3 / 3   3 / 3+  Hip abduction 2     3 / 3+  Hip adduction        Hip internal rotation        Hip external rotation        Knee flexion 4- 4  4    Knee extension 4- 4+  4 4 4   Ankle dorsiflexion        Ankle plantarflexion        Ankle inversion        Ankle eversion         (Blank rows = not tested)  LUMBAR SPECIAL TESTS:  SLR positive on right  FUNCTIONAL TESTS:  Sit to stand: patient will difficulty standing from standard chair, requires UE for support with transferring from sit to stand 08/06/2023: patient able to stand from standard chair without support  GAIT: Assistive device utilized: None Level of assistance: Complete Independence Comments: Antalgic on right   TREATMENT [facility] Adult PT Treatment:                                                DATE: 08/26/2023 Recumbent bike L3 x 5 min to improve endurance and workload capacity Sit to stand 2 x 10 LAQ with 5# 2 x 10 each Seated pelvic tilts x 10 Row with blue 2 x 10 Standing hip extension and abduction with yellow at knees 2 x 10 each Standing TKE with yellow 2 x 10   PATIENT EDUCATION:  Education details: HEP Person educated: Patient Education method: Programmer, multimedia, Demonstration,  Tactile cues, Verbal cues Education comprehension: verbalized understanding, returned demonstration, verbal cues required, tactile cues required, and needs further education  HOME EXERCISE PROGRAM: Access Code: [email or web address]    ASSESSMENT: CLINICAL IMPRESSION: Patient tolerated therapy well with no adverse effects. Therapy focused on progressing LE and hip strengthening this visit. She does report improvement in right knee pain and tightness following needling last visit, but continues to report lower back pain bilaterally. She was able to tolerate all exercises this visit and progress with her hip and knee strengthening. No changes made to HEP this visit. Patient would benefit from continued skilled PT to progress mobility and strength in order to reduce pain and maximize functional ability.   Eval: Patient is a 48 y.o. female who was seen today for physical therapy evaluation and treatment for chronic lower back and right knee pain. Her symptoms do seem consistent with nonspecific lower back pain and right sided lumbar radiculopathy. Evaluation was limited this visit due to patient's high symptom severity and irritability level. She did become tearful during the evaluation. Trial of e-stim IFC combined with cold pack with patient reporting pain level reduction, and provided patient with information for purchasing home TENS unit for pain relief. Unable to provide patient with HEP this visit due to inability to complete exercise without severe pain. Patient was provided extensive education on possible etiologies  of her symptoms and treatments that could be utilized in therapy, and gradual progression for return to activity.   OBJECTIVE IMPAIRMENTS: Abnormal gait, decreased activity tolerance, decreased balance, decreased ROM, decreased strength, impaired flexibility, postural dysfunction, and pain.   ACTIVITY LIMITATIONS: carrying, lifting, bending, sitting, standing, squatting, sleeping, stairs,  transfers, bed mobility, locomotion level, and caring for others  PARTICIPATION LIMITATIONS: meal prep, cleaning, laundry, driving, shopping, and community activity  PERSONAL FACTORS: Fitness, Past/current experiences, Time since onset of injury/illness/exacerbation, and 3+ comorbidities: see PMH above are also affecting patient's functional outcome.    GOALS: Goals reviewed with patient? Yes  SHORT TERM GOALS: Target date: 07/31/2023  Patient will be I with initial HEP in order to progress with therapy. Baseline: None provided at eval 07/28/2023: patient reports independence with HEP Goal status: MET  2.  Patient will report low back and right leg pain </= 7/10 at worst to reduce functional limitations Baseline: 10/10 pain at worst 07/29/2023: reports pain at worst still 10/10 08/06/2023: continues to report pain 10/10 at worst Goal status: ONGOING  3.  Patient will be able to perform sit to stand without difficulty or using UE for support to improve mobility and indicate improvement in LE strength Baseline: requires UE for support with standing from chair 07/29/2023: able to perform sit to stand without UE support but exhibits difficulty  08/06/2023: able to perform sit to stand without UE support but exhibits difficulty due to knee pain Goal status: ONGOING  LONG TERM GOALS: Target date: 08/28/2023  Patient will be I with final HEP to maintain progress from PT. Baseline: None provided at eval 08/06/2023: progressing Goal status: ONGOING  2.  Patient will report Modified Oswestry </= 25/50 (50% disability) in order to indicate an improvement in their functional status Baseline: 37/50 (74% disability) 08/06/2023: not assessed Goal status: DEFERRED  3.  Patient will demonstrate improvement in core and hip strength grossly >/= 4-/5 MMT in order to improve her activity tolerance with household tasks Baseline: see limitations above 08/06/2023: see limitations above Goal status: ONGOING  4.   Patient will demonstrate right knee strength >/= 4+/5 MMT in order to improve stair negotiation and reduce fall risk due to right knee giving out Baseline: see limitations above 08/06/2023: see limitations above Goal status: ONGOING  5. Patient will report lower back and right leg pain </= 4/10 in order to reduce functional limitations  Baseline: 10/10 at worst  08/06/2023: continues to report pain 10/10 at worst  Goal status: ONGOING   PLAN: PT FREQUENCY: 1-2x/week  PT DURATION: 8 weeks  PLANNED INTERVENTIONS: 97164- PT Re-evaluation, 97110-Therapeutic exercises, 97530- Therapeutic activity, 97112- Neuromuscular re-education, 97535- Self Care, 60454- Manual therapy, G0283- Electrical stimulation (unattended), [phone]- Electrical stimulation (manual), Patient/Family education, Balance training, Taping, Dry Needling, Joint mobilization, Joint manipulation, Spinal manipulation, Spinal mobilization, Cryotherapy, and Moist heat.  PLAN FOR NEXT SESSION: Review HEP and progress PRN, continue with modalities for pain relief as needed, manual/TPDN for right lumbar and gluteal region, trial LAD, initiate gentle stretching and mobility for lumbar, initiate gentle core activation, hip and LE strengthening as tolerated    Leah Primus, PT, DPT, LAT, ATC 08/26/23  12:02 PM Phone: [phone] Fax: [phone]

## 2023-08-27 ENCOUNTER — Inpatient Hospital Stay: Attending: Adult Health

## 2023-08-27 ENCOUNTER — Other Ambulatory Visit: Payer: Self-pay | Admitting: Family Medicine

## 2023-08-27 VITALS — BP 217/114 | HR 105 | Temp 98.1°F | Resp 18

## 2023-08-27 DIAGNOSIS — Z9221 Personal history of antineoplastic chemotherapy: Secondary | ICD-10-CM | POA: Diagnosis not present

## 2023-08-27 DIAGNOSIS — Z171 Estrogen receptor negative status [ER-]: Secondary | ICD-10-CM | POA: Diagnosis not present

## 2023-08-27 DIAGNOSIS — Z923 Personal history of irradiation: Secondary | ICD-10-CM | POA: Diagnosis not present

## 2023-08-27 DIAGNOSIS — Z5111 Encounter for antineoplastic chemotherapy: Secondary | ICD-10-CM | POA: Insufficient documentation

## 2023-08-27 DIAGNOSIS — M5416 Radiculopathy, lumbar region: Secondary | ICD-10-CM

## 2023-08-27 DIAGNOSIS — C50412 Malignant neoplasm of upper-outer quadrant of left female breast: Secondary | ICD-10-CM | POA: Insufficient documentation

## 2023-08-27 MED ORDER — GOSERELIN ACETATE 3.6 MG ~~LOC~~ IMPL
3.6000 mg | DRUG_IMPLANT | SUBCUTANEOUS | Status: DC
  Administered 2023-08-27: 3.6 mg via SUBCUTANEOUS
  Filled 2023-08-27: qty 3.6

## 2023-08-27 NOTE — Telephone Encounter (Signed)
 Last OV 07/01/23 Next OV not scheduled  Last refill 02/11/23 Qty #90/2

## 2023-08-27 NOTE — Patient Instructions (Signed)
 Goserelin Implant What is this medication? GOSERELIN (GOE se rel in) treats prostate cancer and breast cancer. It works by decreasing levels of the hormones testosterone and estrogen in the body. This prevents prostate and breast cancer cells from spreading or growing. It may also be used to treat endometriosis. This is a condition where the tissue that lines the uterus grows outside the uterus. It works by decreasing the amount of estrogen your body makes, which reduces heavy bleeding and pain. It can also be used to help thin the lining of the uterus before a surgery used to prevent or reduce heavy periods. This medicine may be used for other purposes; ask your health care provider or pharmacist if you have questions. COMMON BRAND NAME(S): Zoladex, Zoladex 76-Month What should I tell my care team before I take this medication? They need to know if you have any of these conditions: Bone problems Diabetes Heart disease History of irregular heartbeat or rhythm An unusual or allergic reaction to goserelin, other medications, foods, dyes, or preservatives Pregnant or trying to get pregnant Breastfeeding How should I use this medication? This medication is injected under the skin. It is given by your care team in a hospital or clinic setting. Talk to your care team about the use of this medication in children. Special care may be needed. Overdosage: If you think you have taken too much of this medicine contact a poison control center or emergency room at once. NOTE: This medicine is only for you. Do not share this medicine with others. What if I miss a dose? Keep appointments for follow-up doses. It is important not to miss your dose. Call your care team if you are unable to keep an appointment. What may interact with this medication? Do not take this medication with any of the following: Cisapride Dronedarone Pimozide Thioridazine This medication may also interact with the following: Other  medications that cause heart rhythm changes This list may not describe all possible interactions. Give your health care provider a list of all the medicines, herbs, non-prescription drugs, or dietary supplements you use. Also tell them if you smoke, drink alcohol, or use illegal drugs. Some items may interact with your medicine. What should I watch for while using this medication? Visit your care team for regular checks on your progress. Your symptoms may appear to get worse during the first weeks of this therapy. Tell your care team if your symptoms do not start to get better or if they get worse after this time. Using this medication for a long time may weaken your bones. If you smoke or frequently drink alcohol you may increase your risk of bone loss. A family history of osteoporosis, chronic use of medications for seizures (convulsions), or corticosteroids can also increase your risk of bone loss. The risk of bone fractures may be increased. Talk to your care team about your bone health. This medication may increase blood sugar. The risk may be higher in patients who already have diabetes. Ask your care team what you can do to lower your risk of diabetes while taking this medication. This medication should stop regular monthly menstruation in women. Tell your care team if you continue to menstruate. Talk to your care team if you wish to become pregnant or think you might be pregnant. This medication can cause serious birth defects if taken during pregnancy or for 12 weeks after stopping treatment. Talk to your care team about reliable forms of contraception. Do not breastfeed while taking this  medication. This medication may cause infertility. Talk to your care team if you are concerned about your fertility. What side effects may I notice from receiving this medication? Side effects that you should report to your care team as soon as possible: Allergic reactions--skin rash, itching, hives, swelling  of the face, lips, tongue, or throat Change in the amount of urine Heart attack--pain or tightness in the chest, shoulders, arms, or jaw, nausea, shortness of breath, cold or clammy skin, feeling faint or lightheaded Heart rhythm changes--fast or irregular heartbeat, dizziness, feeling faint or lightheaded, chest pain, trouble breathing High blood sugar (hyperglycemia)--increased thirst or amount of urine, unusual weakness or fatigue, blurry vision High calcium level--increased thirst or amount of urine, nausea, vomiting, confusion, unusual weakness or fatigue, bone pain Pain, redness, irritation, or bruising at the injection site Severe back pain, numbness or weakness of the hands, arms, legs, or feet, loss of coordination, loss of bowel or bladder control Stroke--sudden numbness or weakness of the face, arm, or leg, trouble speaking, confusion, trouble walking, loss of balance or coordination, dizziness, severe headache, change in vision Swelling and pain of the tumor site or lymph nodes Trouble passing urine Side effects that usually do not require medical attention (report to your care team if they continue or are bothersome): Change in sex drive or performance Headache Hot flashes Rapid or extreme change in emotion or mood Sweating Swelling of the ankles, hands, or feet Unusual vaginal discharge, itching, or odor This list may not describe all possible side effects. Call your doctor for medical advice about side effects. You may report side effects to FDA at 1-800-FDA-1088. Where should I keep my medication? This medication is given in a hospital or clinic. It will not be stored at home. NOTE: This sheet is a summary. It may not cover all possible information. If you have questions about this medicine, talk to your doctor, pharmacist, or health care provider.  2024 Elsevier/Gold Standard (2021-08-09 00:00:00)

## 2023-08-28 ENCOUNTER — Other Ambulatory Visit (HOSPITAL_COMMUNITY): Payer: Self-pay

## 2023-08-28 ENCOUNTER — Telehealth: Payer: Self-pay | Admitting: Pharmacy Technician

## 2023-08-28 NOTE — Telephone Encounter (Signed)
 Pharmacy Patient Advocate Encounter  Received notification from [facility] that Prior Authorization for repatha  has been APPROVED from 08/28/23 to 02/28/24. Ran test claim, Copay is $0.00- 3 months. This test claim was processed through [facility]- copay amounts may vary at other pharmacies due to pharmacy/plan contracts, or as the patient moves through the different stages of their insurance plan.   PA #/Case ID/Reference #: [id number]

## 2023-08-28 NOTE — Telephone Encounter (Signed)
 Pharmacy Patient Advocate Encounter   Received notification from CoverMyMeds that prior authorization for Repatha  is required/requested.   Insurance verification completed.   The patient is insured through [employer] .   Per test claim: PA required; PA submitted to above mentioned insurance via CoverMyMeds Key/confirmation #/EOC [id number] Status is pending

## 2023-08-29 ENCOUNTER — Other Ambulatory Visit: Payer: Self-pay

## 2023-08-29 ENCOUNTER — Encounter: Payer: Self-pay | Admitting: Physical Therapy

## 2023-08-29 ENCOUNTER — Ambulatory Visit (INDEPENDENT_AMBULATORY_CARE_PROVIDER_SITE_OTHER): Admitting: Physical Therapy

## 2023-08-29 DIAGNOSIS — M5459 Other low back pain: Secondary | ICD-10-CM | POA: Diagnosis not present

## 2023-08-29 DIAGNOSIS — M79604 Pain in right leg: Secondary | ICD-10-CM | POA: Diagnosis not present

## 2023-08-29 DIAGNOSIS — M6281 Muscle weakness (generalized): Secondary | ICD-10-CM

## 2023-08-29 NOTE — Therapy (Signed)
 OUTPATIENT PHYSICAL THERAPY TREATMENT   Patient Name: Erica [name] MRN: [id number] DOB:March 11, 1976, 48 y.o., female Today's Date: 08/29/2023   END OF SESSION:  PT End of Session - 08/29/23 0912     Visit Number 17    Number of Visits 34    Date for PT Re-Evaluation 10/24/23    Authorization Type [employer] MCR    Progress Note Due on Visit 20    PT Start Time [phone]    PT Stop Time 0930    PT Time Calculation (min) 46 min    Activity Tolerance Patient tolerated treatment well    Behavior During Therapy [facility] for tasks assessed/performed                             Past Medical History:  Diagnosis Date   Allergy     Anxiety    Apnea, sleep 05/04/2014   AHI 31/hr now on CPAP at 12cm H2O   Arthritis    Asthma    Asthma without status asthmaticus 01/19/2016   Axillary hidradenitis suppurativa 08/01/2011   Blood transfusion without reported diagnosis    Breast cancer (HCC) 2010   a. locally advanced left breast carcinoma diagnosed in 2010 and treated with neoadjuvant chemotherapy with docetaxel and cyclophosphamide as well as paclitaxel. This was followed by radiation therapy which was completed in 2011.   Chemotherapy-induced peripheral neuropathy (HCC) 12/09/2016   CHF (congestive heart failure) (HCC)    Chronic diastolic CHF (congestive heart failure) (HCC) 06/23/2013   Chronic kidney disease    Chronic pain in left foot 06/29/2018   Chronic stable angina (HCC) 08/22/2020   Cough variant asthma 09/27/2010   Followed in Pulmonary clinic/ [facility]/ [facility]  Onset reported in childhood  - PFT's 01/31/2011 FEV1  1.76 (60% ) ratio 68 and no better p B2,  DLCO 70% -med review 09/25/2012 > [place]  drug assistance  paperwork given    - HFA 90% 11/20/2012 .  -Med calendar 12/07/12 , 04/06/2013 , 04/18/2014,, 08/30/2014 , 11/08/2014  - PFT's 01/12/2013  1.73 (65%)  Ratio 73 and no better p B2, DLCO 86% - trial off    Depression with somatization 06/15/2015   Dyspnea on exertion     a. 01/2013 Lexi MV: EF 54%, no ischemia/infarct;  b. 05/2013 Echo: EF 60-65%, no rwma, Gr 2 DD;  b.    Essential hypertension 10/09/2014   Estimated Creatinine Clearance: 82.4 mL/min (by C-G formula based on SCr of 0.98 mg/dL).   Fatty liver    Gastroparesis    GERD (gastroesophageal reflux disease)    Hyperglycemia 04/13/2011   Hypertension    Hypertensive heart disease 09/26/2015   Hypertriglyceridemia without hypercholesterolemia 06/15/2015   Framingham risk score is 1%    Idiopathic intracranial hypertension 10/21/2014   Impaired glucose tolerance 04/13/2011   Insomnia secondary to anxiety 04/05/2015   Lymphadenitis, chronic    restricted LEFT extremity   Malignant neoplasm of upper-outer quadrant of left breast in female, estrogen receptor negative (HCC) 01/07/2013   Stage III, Invasive Ductal, s/p partial Left Mastectomy/Lumpectomy (baseball sized) with Lymph Node dissection, had Chemo and RadRx     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   OSA (obstructive sleep apnea) 05/04/2014   AHI 31/hr now on CPAP at 12cm H2O   Oxygen deficiency    Uses 02 in CPAP   Persistent proteinuria 03/31/2018   Personal history of chemotherapy 2010   Personal history of radiation therapy  2010   Resistant hypertension 09/17/2011       Sleep apnea    SVT (supraventricular tachycardia) (HCC) 08/22/2020   Tibialis posterior tendinitis, left 07/15/2018   Injected in March 10, 2019   Ulcer    Upper airway cough syndrome 10/09/2014   Onset around 2012 with assoc pseudowheeze MRI 05/18/15 > Paranasal sinuses are clear - 11/17/2015 rec increase h1 to 2 hs to help with noct cough  - Allergy  profile 11/17/2015 >  Eos 0.1 /  IgE  80 POS RAST  to grass, ragweed, cedar tree  - d/c acei 03/06/2018  - flare end of Jan 2020 in setting of ? Uri/ poorly controlled GERD - flare mid Oct 2020 with uri / assoc secondary gerd   Vitamin D  deficiency disease 03/31/2018   Past Surgical History:  Procedure Laterality  Date   BRAIN SURGERY     Shunt   BREAST LUMPECTOMY Left 09/2008   CARPAL TUNNEL RELEASE Left 2011   LYMPH NODE DISSECTION Left 2010   breast; 2 wks after breast lumpectomy   RENAL DENERVATION N/A 11/20/2022   Procedure: RENAL DENERVATION;  Surgeon: Wenona Hamilton, MD;  Location: [facility] INVASIVE CV LAB;  Service: Cardiovascular;  Laterality: N/A;   RIGHT/LEFT HEART CATH AND CORONARY ANGIOGRAPHY N/A 05/04/2019   Procedure: RIGHT/LEFT HEART CATH AND CORONARY ANGIOGRAPHY;  Surgeon: Mardell Shade, MD;  Location: [facility] INVASIVE CV [facility];  Service: Cardiovascular;  Laterality: N/A;  Renal injection preformed    RIGHT/LEFT HEART CATH AND CORONARY ANGIOGRAPHY N/A 10/25/2020   Procedure: RIGHT/LEFT HEART CATH AND CORONARY ANGIOGRAPHY;  Surgeon: Arleen Lacer, MD;  Location: [facility] INVASIVE CV LAB;  Service: Cardiovascular;  Laterality: N/A;   TENDON RELEASE Right 2012   "hand"   TUBAL LIGATION  05/11/2011   Procedure: POST PARTUM TUBAL LIGATION;  Surgeon: Onnie Bilis, MD;  Location: [facility];  Service: Gynecology;  Laterality: Bilateral;  Induced for HTN   Patient Active Problem List   Diagnosis Date Noted   Prediabetes 06/13/2023   Moderate episode of recurrent major depressive disorder (HCC) 02/04/2023   GAD (generalized anxiety disorder) 02/04/2023   Chronic respiratory failure with hypoxia (HCC) 12/31/2021   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   Coronary artery disease    Chronic stable angina (HCC) 08/22/2020   SVT (supraventricular tachycardia) (HCC) 08/22/2020   Palpitations 02/28/2020   Right ankle pain 04/14/2019   OSA (obstructive sleep apnea) 10/01/2018   Tibialis posterior tendinitis, left 07/15/2018   Chronic pain in left foot 06/29/2018   Vitamin D  deficiency disease 03/31/2018   Persistent proteinuria 03/31/2018   Hyperlipidemia LDL goal <100 03/30/2018   Chemotherapy-induced peripheral neuropathy (HCC) 12/09/2016   Asthma in adult, mild persistent, uncomplicated 01/19/2016   Hypertensive heart  disease 09/26/2015   Depression with somatization 06/15/2015   Hypertriglyceridemia without hypercholesterolemia 06/15/2015   Hypokalemia 06/15/2015   Insomnia secondary to anxiety 04/05/2015   Idiopathic intracranial hypertension 10/21/2014   Upper airway cough syndrome 10/09/2014   Malignant hypertension 10/09/2014   Morbid obesity due to excess calories (HCC) 10/09/2014   Apnea, sleep 05/04/2014   Acute on chronic diastolic CHF (congestive heart failure), NYHA class 2 (HCC) 05/03/2014   DOE (dyspnea on exertion) 07/14/2013   Acute on chronic diastolic heart failure (HCC) 06/23/2013   Malignant neoplasm of upper-outer quadrant of left breast in female, estrogen receptor negative (HCC) 01/07/2013   Gastroparesis 01/04/2013   Resistant hypertension 09/17/2011   Axillary hidradenitis suppurativa 08/01/2011   Preventative health care 04/13/2011  Hyperglycemia 04/13/2011   GERD (gastroesophageal reflux disease) 03/18/2011   Cough variant asthma 09/27/2010    PCP: Nonda Bays, FNP  REFERRING PROVIDER: Syliva Even, MD  REFERRING DIAG: Lumbar radiculopathy  Rationale for Evaluation and Treatment: Rehabilitation  THERAPY DIAG:  Other low back pain  Pain in right leg  Muscle weakness (generalized)  ONSET DATE: August 2024   SUBJECTIVE:     SUBJECTIVE STATEMENT: Patient reports she is ok today. The right knee doesn't hurt as bad on the side but has had some occasions of "lightning" in the front. Her right lower back is a little painful today and she has noticed some more swelling in the front of the right knee.   Eval: Patients reports she had renal denervation done in August 2024 and has been in pain ever since. Pain is located across the lower back, she was told it would hurt for 3-4 months but pain persisted in the lower back. The right knee pain started after in October or November, and it will keep her up at night. She states it feels heavy in her lower back,  like labor pain that never stops. The pain will start in the right lower back region and then will feel like lightning shooting down the right leg to the back and outside of the right knee, pain does not go past the knee. She leans on the bed for relief, and she states getting on her stomach gives her the most relief, but she has a lot of pain when getting up. She does use a heating pad as well. She reports that to help with the knee pain at night she has to have her leg up on the wall. She states she moves around a lot because sitting or standing for long periods will make it worse. Will occasionally have trouble bending down and she states the right leg has given out on her causing her to fall. She does have trouble going up/down stairs, she turns to the side when going down, and she also has breathing issues so when she goes up stairs to get inside she has to immediately get on oxygen. She reports inability to play with or lift her grandchildren and difficulty completing activities around her home for her son.  PERTINENT HISTORY:  See PMH above  PAIN:  Are you having pain? Yes:  NPRS scale: 5/10 currently, 10/10 at worst Pain location: Lower back and right leg/knee Pain description: Heavy, labor pains, constant Aggravating factors: Standing, sitting, activity Relieving factors: Lying on stomach, leaning on bed or counter, heat  PRECAUTIONS: Fall, patient reports she was told not to lift more than 10 lbs  FALLS:  Has patient fallen in last 6 months? Yes. Number of falls 3-4, right knee gives out or sharp pains could cause her to fall  PATIENT GOALS: Pain relief, get back to doing normal things   OBJECTIVE:  Note: Objective measures were completed at Evaluation unless otherwise noted. PATIENT SURVEYS:  Modified Oswestry 37/50 (74% disability)   08/29/2023: 33/50 (66%)  SENSATION: Patient reports neuropathy of feet and hands  MUSCLE LENGTH: Unable to fully assess, severe increase in  right lower back and leg pain when attempting to assess hamstring length  POSTURE:  Rounded shoulder posture, normal lumbar lordosis, weight shift toward left and patient frequently changing positions while seated for comfort  PALPATION: Exquisite tenderness to right lower lumbar paraspinals, right gluteal and TFL region, right lateral thigh and knee; moderate tenderness to left lumbar  paraspinals  Unable to assess lumbar mobility  LUMBAR ROM:   AROM eval  Flexion   Extension   Right lateral flexion   Left lateral flexion   Right rotation   Left rotation    (Blank rows = not tested)  LOWER EXTREMITY ROM:    Limitations in right hip ROM due to pain and guarding, right knee flexion limited due to pain  LOWER EXTREMITY MMT:    MMT Right eval Left eval Rt / Lt 07/08/2023 Right 07/22/2023 Right 07/31/2023 Rt / Lt 08/06/2023 Rt / Lt 08/29/2023  Hip flexion 3+ 4       Hip extension 2 3- 3 / 3   3 / 3+ 3 / 3+  Hip abduction 2     3 / 3+ 3 / 3+  Hip adduction         Hip internal rotation         Hip external rotation         Knee flexion 4- 4  4     Knee extension 4- 4+  4 4 4 4  / 5  Ankle dorsiflexion         Ankle plantarflexion         Ankle inversion         Ankle eversion          (Blank rows = not tested)  LUMBAR SPECIAL TESTS:  SLR positive on right  FUNCTIONAL TESTS:  Sit to stand: patient will difficulty standing from standard chair, requires UE for support with transferring from sit to stand 08/06/2023: patient able to stand from standard chair without support  GAIT: Assistive device utilized: None Level of assistance: Complete Independence Comments: Antalgic on right   TREATMENT [facility] Adult PT Treatment:                                                DATE: 08/29/2023 Recumbent bike L3 x 5 min to improve endurance and workload capacity Sit to stand 2 x 10 LAQ with 5# 2 x 10 each Seated pelvic tilts x 10 Standing lumbar extension at wall x 10 Standing TKE with  yellow 2 x 10 KT tape for right knee edema management  Trigger Point Dry Needling  Subsequent Treatment: Instructions provided previously at initial dry needling treatment.  Instructions reviewed, if requested by the patient, prior to subsequent dry needling treatment.   Patient Verbal Consent Given: Yes Education Handout Provided: Previously Provided Muscles Treated: Bilateral L3-5 multifidi, right lateral hamstring, lateral and anterior quad Electrical Stimulation Performed: No Treatment Response/Outcome: Twitch response   PATIENT EDUCATION:  Education details: POC extension, HEP, TPDN Person educated: Patient Education method: Explanation, Demonstration, Tactile cues, Verbal cues Education comprehension: verbalized understanding, returned demonstration, verbal cues required, tactile cues required, and needs further education  HOME EXERCISE PROGRAM: Access Code: [email or web address]    ASSESSMENT: CLINICAL IMPRESSION: Patient tolerated therapy well with no adverse effects. She does report a slight improvement in her functional ability this visit on modified oswestry. Continued with TPDN this visit for the lumbar region and right knee, incorporating more quad needling to improve her patellar pain. She did report muscle soreness post treatment this visit. Patient was also report increased right knee swelling and she does exhibit observable swelling compared to the left side so applied KT tape for edema management. Therapy continues to work  on strengthening for the core, hips, and LE. She has improved with her strength and mobility since start of therapy. No changes were made to her HEP this visit. Patient would benefit from continued skilled PT to progress mobility and strength in order to reduce pain and maximize functional ability, so will extend her PT POC for 8 more weeks.   Eval: Patient is a 48 y.o. female who was seen today for physical therapy evaluation and treatment for chronic lower back  and right knee pain. Her symptoms do seem consistent with nonspecific lower back pain and right sided lumbar radiculopathy. Evaluation was limited this visit due to patient's high symptom severity and irritability level. She did become tearful during the evaluation. Trial of e-stim IFC combined with cold pack with patient reporting pain level reduction, and provided patient with information for purchasing home TENS unit for pain relief. Unable to provide patient with HEP this visit due to inability to complete exercise without severe pain. Patient was provided extensive education on possible etiologies of her symptoms and treatments that could be utilized in therapy, and gradual progression for return to activity.   OBJECTIVE IMPAIRMENTS: Abnormal gait, decreased activity tolerance, decreased balance, decreased ROM, decreased strength, impaired flexibility, postural dysfunction, and pain.   ACTIVITY LIMITATIONS: carrying, lifting, bending, sitting, standing, squatting, sleeping, stairs, transfers, bed mobility, locomotion level, and caring for others  PARTICIPATION LIMITATIONS: meal prep, cleaning, laundry, driving, shopping, and community activity  PERSONAL FACTORS: Fitness, Past/current experiences, Time since onset of injury/illness/exacerbation, and 3+ comorbidities: see PMH above are also affecting patient's functional outcome.    GOALS: Goals reviewed with patient? Yes  SHORT TERM GOALS: Target date: 09/26/2023  Patient will be I with initial HEP in order to progress with therapy. Baseline: None provided at eval 07/28/2023: patient reports independence with HEP Goal status: MET  2.  Patient will report low back and right leg pain </= 7/10 at worst to reduce functional limitations Baseline: 10/10 pain at worst 07/29/2023: reports pain at worst still 10/10 08/06/2023: continues to report pain 10/10 at worst 08/29/2023: continues to report pain 10/10 at worst Goal status: ONGOING  3.   Patient will be able to perform sit to stand without difficulty or using UE for support to improve mobility and indicate improvement in LE strength Baseline: requires UE for support with standing from chair 07/29/2023: able to perform sit to stand without UE support but exhibits difficulty  08/06/2023: able to perform sit to stand without UE support but exhibits difficulty due to knee pain 08/29/2023: patient can perform sit to stand without difficulty Goal status: MET  LONG TERM GOALS: Target date: 10/24/2023  Patient will be I with final HEP to maintain progress from PT. Baseline: None provided at eval 08/06/2023: progressing 08/29/2023: Goal status: ONGOING  2.  Patient will report Modified Oswestry </= 25/50 (50% disability) in order to indicate an improvement in their functional status Baseline: 37/50 (74% disability) 08/06/2023: not assessed 08/29/2023: 33/50 (66%) Goal status: ONGOING  3.  Patient will demonstrate improvement in core and hip strength grossly >/= 4-/5 MMT in order to improve her activity tolerance with household tasks Baseline: see limitations above 08/06/2023: see limitations above 08/29/2023: see limitations above Goal status: ONGOING  4.  Patient will demonstrate right knee strength >/= 4+/5 MMT in order to improve stair negotiation and reduce fall risk due to right knee giving out Baseline: see limitations above 08/06/2023: see limitations above 08/29/2023: see limitations above Goal status: ONGOING  5. Patient  will report lower back and right leg pain </= 4/10 in order to reduce functional limitations  Baseline: 10/10 at worst  08/06/2023: continues to report pain 10/10 at worst 08/29/2023: continues to report pain 10/10 at worst  Goal status: ONGOING   PLAN: PT FREQUENCY: 1-2x/week  PT DURATION: 8 weeks  PLANNED INTERVENTIONS: 97164- PT Re-evaluation, 97110-Therapeutic exercises, 97530- Therapeutic activity, 97112- Neuromuscular re-education, 97535- Self Care,  97140- Manual therapy, [id number]- Electrical stimulation (unattended), [phone]- Electrical stimulation (manual), Patient/Family education, Balance training, Taping, Dry Needling, Joint mobilization, Joint manipulation, Spinal manipulation, Spinal mobilization, Cryotherapy, and Moist heat.  PLAN FOR NEXT SESSION: Review HEP and progress PRN, continue with modalities for pain relief as needed, manual/TPDN for right lumbar and gluteal region, LAD, gentle stretching and mobility for lumbar, gentle core activation, hip and LE strengthening as tolerated    Leah Primus, PT, DPT, LAT, ATC 08/29/23  9:44 AM Phone: [phone] Fax: [phone]

## 2023-09-02 ENCOUNTER — Encounter: Payer: Self-pay | Admitting: Physical Therapy

## 2023-09-02 ENCOUNTER — Ambulatory Visit (INDEPENDENT_AMBULATORY_CARE_PROVIDER_SITE_OTHER): Admitting: Physical Therapy

## 2023-09-02 ENCOUNTER — Other Ambulatory Visit: Payer: Self-pay

## 2023-09-02 DIAGNOSIS — M6281 Muscle weakness (generalized): Secondary | ICD-10-CM

## 2023-09-02 DIAGNOSIS — M79604 Pain in right leg: Secondary | ICD-10-CM

## 2023-09-02 DIAGNOSIS — M5459 Other low back pain: Secondary | ICD-10-CM

## 2023-09-02 NOTE — Therapy (Signed)
 OUTPATIENT PHYSICAL THERAPY TREATMENT   Patient Name: Erica [name] MRN: [id number] DOB:08/28/1975, 48 y.o., female Today's Date: 09/02/2023   END OF SESSION:  PT End of Session - 09/02/23 1036     Visit Number 18    Number of Visits 34    Date for PT Re-Evaluation 10/24/23    Authorization Type [employer] MCR    Progress Note Due on Visit 20    PT Start Time 1021    PT Stop Time 1100    PT Time Calculation (min) 39 min    Activity Tolerance Patient tolerated treatment well    Behavior During Therapy WFL for tasks assessed/performed                              Past Medical History:  Diagnosis Date   Allergy     Anxiety    Apnea, sleep 05/04/2014   AHI 31/hr now on CPAP at 12cm H2O   Arthritis    Asthma    Asthma without status asthmaticus 01/19/2016   Axillary hidradenitis suppurativa 08/01/2011   Blood transfusion without reported diagnosis    Breast cancer (HCC) 2010   a. locally advanced left breast carcinoma diagnosed in 2010 and treated with neoadjuvant chemotherapy with docetaxel and cyclophosphamide as well as paclitaxel. This was followed by radiation therapy which was completed in 2011.   Chemotherapy-induced peripheral neuropathy (HCC) 12/09/2016   CHF (congestive heart failure) (HCC)    Chronic diastolic CHF (congestive heart failure) (HCC) 06/23/2013   Chronic kidney disease    Chronic pain in left foot 06/29/2018   Chronic stable angina (HCC) 08/22/2020   Cough variant asthma 09/27/2010   Followed in Pulmonary clinic/ [facility]/ [place]  Onset reported in childhood  - PFT's 01/31/2011 FEV1  1.76 (60% ) ratio 68 and no better p B2,  DLCO 70% -med review 09/25/2012 > [place]  drug assistance  paperwork given    - HFA 90% 11/20/2012 .  -Med calendar 12/07/12 , 04/06/2013 , 04/18/2014,, 08/30/2014 , 11/08/2014  - PFT's 01/12/2013  1.73 (65%)  Ratio 73 and no better p B2, DLCO 86% - trial off    Depression with somatization 06/15/2015   Dyspnea on exertion     a. 01/2013 Lexi MV: EF 54%, no ischemia/infarct;  b. 05/2013 Echo: EF 60-65%, no rwma, Gr 2 DD;  b.    Essential hypertension 10/09/2014   Estimated Creatinine Clearance: 82.4 mL/min (by C-G formula based on SCr of 0.98 mg/dL).   Fatty liver    Gastroparesis    GERD (gastroesophageal reflux disease)    Hyperglycemia 04/13/2011   Hypertension    Hypertensive heart disease 09/26/2015   Hypertriglyceridemia without hypercholesterolemia 06/15/2015   Framingham risk score is 1%    Idiopathic intracranial hypertension 10/21/2014   Impaired glucose tolerance 04/13/2011   Insomnia secondary to anxiety 04/05/2015   Lymphadenitis, chronic    restricted LEFT extremity   Malignant neoplasm of upper-outer quadrant of left breast in female, estrogen receptor negative (HCC) 01/07/2013   Stage III, Invasive Ductal, s/p partial Left Mastectomy/Lumpectomy (baseball sized) with Lymph Node dissection, had Chemo and RadRx     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   OSA (obstructive sleep apnea) 05/04/2014   AHI 31/hr now on CPAP at 12cm H2O   Oxygen deficiency    Uses 02 in CPAP   Persistent proteinuria 03/31/2018   Personal history of chemotherapy 2010   Personal history of radiation  therapy 2010   Resistant hypertension 09/17/2011       Sleep apnea    SVT (supraventricular tachycardia) (HCC) 08/22/2020   Tibialis posterior tendinitis, left 07/15/2018   Injected in March 10, 2019   Ulcer    Upper airway cough syndrome 10/09/2014   Onset around 2012 with assoc pseudowheeze MRI 05/18/15 > Paranasal sinuses are clear - 11/17/2015 rec increase h1 to 2 hs to help with noct cough  - Allergy  profile 11/17/2015 >  Eos 0.1 /  IgE  80 POS RAST  to grass, ragweed, cedar tree  - d/c acei 03/06/2018  - flare end of Jan 2020 in setting of ? Uri/ poorly controlled GERD - flare mid Oct 2020 with uri / assoc secondary gerd   Vitamin D  deficiency disease 03/31/2018   Past Surgical History:  Procedure Laterality  Date   BRAIN SURGERY     Shunt   BREAST LUMPECTOMY Left 09/2008   CARPAL TUNNEL RELEASE Left 2011   LYMPH NODE DISSECTION Left 2010   breast; 2 wks after breast lumpectomy   RENAL DENERVATION N/A 11/20/2022   Procedure: RENAL DENERVATION;  Surgeon: Wenona Hamilton, MD;  Location: [facility] INVASIVE CV [facility];  Service: Cardiovascular;  Laterality: N/A;   RIGHT/LEFT HEART CATH AND CORONARY ANGIOGRAPHY N/A 05/04/2019   Procedure: RIGHT/LEFT HEART CATH AND CORONARY ANGIOGRAPHY;  Surgeon: Mardell Shade, MD;  Location: [facility] INV[facility];  Service: Cardiovascular;  Laterality: N/A;  Renal injection preformed    RIGHT/LEFT HEART CATH AND CORONARY ANGIOGRAPHY N/A 10/25/2020   Procedure: RIGHT/LEFT HEART CATH AND CORONARY ANGIOGRAPHY;  Surgeon: Arleen Lacer, MD;  Location: [facility] INVASIVE CV LAB;  Service: Cardiovascular;  Laterality: N/A;   TENDON RELEASE Right 2012   "hand"   TUBAL LIGATION  05/11/2011   Procedure: POST PARTUM TUBAL LIGATION;  Surgeon: Onnie Bilis, MD;  Location: [facility];  Service: Gynecology;  Laterality: Bilateral;  Induced for HTN   Patient Active Problem List   Diagnosis Date Noted   Prediabetes 06/13/2023   Moderate episode of recurrent major depressive disorder (HCC) 02/04/2023   GAD (generalized anxiety disorder) 02/04/2023   Chronic respiratory failure with hypoxia (HCC) 12/31/2021   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   Coronary artery disease    Chronic stable angina (HCC) 08/22/2020   SVT (supraventricular tachycardia) (HCC) 08/22/2020   Palpitations 02/28/2020   Right ankle pain 04/14/2019   OSA (obstructive sleep apnea) 10/01/2018   Tibialis posterior tendinitis, left 07/15/2018   Chronic pain in left foot 06/29/2018   Vitamin D  deficiency disease 03/31/2018   Persistent proteinuria 03/31/2018   Hyperlipidemia LDL goal <100 03/30/2018   Chemotherapy-induced peripheral neuropathy (HCC) 12/09/2016   Asthma in adult, mild persistent, uncomplicated 01/19/2016   Hypertensive heart  disease 09/26/2015   Depression with somatization 06/15/2015   Hypertriglyceridemia without hypercholesterolemia 06/15/2015   Hypokalemia 06/15/2015   Insomnia secondary to anxiety 04/05/2015   Idiopathic intracranial hypertension 10/21/2014   Upper airway cough syndrome 10/09/2014   Malignant hypertension 10/09/2014   Morbid obesity due to excess calories (HCC) 10/09/2014   Apnea, sleep 05/04/2014   Acute on chronic diastolic CHF (congestive heart failure), NYHA class 2 (HCC) 05/03/2014   DOE (dyspnea on exertion) 07/14/2013   Acute on chronic diastolic heart failure (HCC) 06/23/2013   Malignant neoplasm of upper-outer quadrant of left breast in female, estrogen receptor negative (HCC) 01/07/2013   Gastroparesis 01/04/2013   Resistant hypertension 09/17/2011   Axillary hidradenitis suppurativa 08/01/2011   Preventative health care 04/13/2011  Hyperglycemia 04/13/2011   GERD (gastroesophageal reflux disease) 03/18/2011   Cough variant asthma 09/27/2010    PCP: Nonda Bays, FNP  REFERRING PROVIDER: Syliva Even, MD  REFERRING DIAG: Lumbar radiculopathy  Rationale for Evaluation and Treatment: Rehabilitation  THERAPY DIAG:  Other low back pain  Pain in right leg  Muscle weakness (generalized)  ONSET DATE: August 2024   SUBJECTIVE:     SUBJECTIVE STATEMENT: Patient reports she states this morning her back didn't hurt as bad until she stood up. She states continued right knee pain but the tightness in the back of the knee is not as bad as it was. She reports no increase in knee swelling since last visit so she feels the tape was helpful.  Eval: Patients reports she had renal denervation done in August 2024 and has been in pain ever since. Pain is located across the lower back, she was told it would hurt for 3-4 months but pain persisted in the lower back. The right knee pain started after in October or November, and it will keep her up at night. She states it  feels heavy in her lower back, like labor pain that never stops. The pain will start in the right lower back region and then will feel like lightning shooting down the right leg to the back and outside of the right knee, pain does not go past the knee. She leans on the bed for relief, and she states getting on her stomach gives her the most relief, but she has a lot of pain when getting up. She does use a heating pad as well. She reports that to help with the knee pain at night she has to have her leg up on the wall. She states she moves around a lot because sitting or standing for long periods will make it worse. Will occasionally have trouble bending down and she states the right leg has given out on her causing her to fall. She does have trouble going up/down stairs, she turns to the side when going down, and she also has breathing issues so when she goes up stairs to get inside she has to immediately get on oxygen. She reports inability to play with or lift her grandchildren and difficulty completing activities around her home for her son.  PERTINENT HISTORY:  See PMH above  PAIN:  Are you having pain? Yes:  NPRS scale: 5/10 currently, 10/10 at worst Pain location: Lower back and right leg/knee Pain description: Heavy, labor pains, constant Aggravating factors: Standing, sitting, activity Relieving factors: Lying on stomach, leaning on bed or counter, heat  PRECAUTIONS: Fall, patient reports she was told not to lift more than 10 lbs  FALLS:  Has patient fallen in last 6 months? Yes. Number of falls 3-4, right knee gives out or sharp pains could cause her to fall  PATIENT GOALS: Pain relief, get back to doing normal things   OBJECTIVE:  Note: Objective measures were completed at Evaluation unless otherwise noted. PATIENT SURVEYS:  Modified Oswestry 37/50 (74% disability)   08/29/2023: 33/50 (66%)  SENSATION: Patient reports neuropathy of feet and hands  MUSCLE LENGTH: Unable to fully  assess, severe increase in right lower back and leg pain when attempting to assess hamstring length  POSTURE:  Rounded shoulder posture, normal lumbar lordosis, weight shift toward left and patient frequently changing positions while seated for comfort  PALPATION: Exquisite tenderness to right lower lumbar paraspinals, right gluteal and TFL region, right lateral thigh and knee;  moderate tenderness to left lumbar paraspinals  Unable to assess lumbar mobility  LUMBAR ROM:   AROM eval  Flexion   Extension   Right lateral flexion   Left lateral flexion   Right rotation   Left rotation    (Blank rows = not tested)  LOWER EXTREMITY ROM:    Limitations in right hip ROM due to pain and guarding, right knee flexion limited due to pain  LOWER EXTREMITY MMT:    MMT Right eval Left eval Rt / Lt 07/08/2023 Right 07/22/2023 Right 07/31/2023 Rt / Lt 08/06/2023 Rt / Lt 08/29/2023  Hip flexion 3+ 4       Hip extension 2 3- 3 / 3   3 / 3+ 3 / 3+  Hip abduction 2     3 / 3+ 3 / 3+  Hip adduction         Hip internal rotation         Hip external rotation         Knee flexion 4- 4  4     Knee extension 4- 4+  4 4 4 4  / 5  Ankle dorsiflexion         Ankle plantarflexion         Ankle inversion         Ankle eversion          (Blank rows = not tested)  LUMBAR SPECIAL TESTS:  SLR positive on right  FUNCTIONAL TESTS:  Sit to stand: patient will difficulty standing from standard chair, requires UE for support with transferring from sit to stand 08/06/2023: patient able to stand from standard chair without support  GAIT: Assistive device utilized: None Level of assistance: Complete Independence Comments: Antalgic on right   TREATMENT [facility] Adult PT Treatment:                                                DATE: 09/02/2023 Recumbent bike L3 x 5 min to improve endurance and workload capacity Sit to stand 2 x 10 Seated pelvic tilts x 10 Pallof press with L1 powerband 2 x 10 each Side  stepping at counter with yellow at knees 2 x 5 lengths down/back Standing hip abduction with yellow at knees 2 x 10 each LAQ with 5# 2 x 10 each KT tape for right knee edema management  PATIENT EDUCATION:  Education details: HEP Person educated: Patient Education method: Explanation, Demonstration, Tactile cues, Verbal cues Education comprehension: verbalized understanding, returned demonstration, verbal cues required, tactile cues required, and needs further education  HOME EXERCISE PROGRAM: Access Code: [email or web address]    ASSESSMENT: CLINICAL IMPRESSION: Patient tolerated therapy well with no adverse effects. Therapy focused on progressing her LE strengthening and core stabilization. Continued with KT taping for right knee edema management as patient reported this was beneficial. No changes made to HEP this visit. Patient would benefit from continued skilled PT to progress mobility and strength in order to reduce pain and maximize functional ability.   Eval: Patient is a 48 y.o. female who was seen today for physical therapy evaluation and treatment for chronic lower back and right knee pain. Her symptoms do seem consistent with nonspecific lower back pain and right sided lumbar radiculopathy. Evaluation was limited this visit due to patient's high symptom severity and irritability level. She did become tearful during the evaluation. Trial  of e-stim IFC combined with cold pack with patient reporting pain level reduction, and provided patient with information for purchasing home TENS unit for pain relief. Unable to provide patient with HEP this visit due to inability to complete exercise without severe pain. Patient was provided extensive education on possible etiologies of her symptoms and treatments that could be utilized in therapy, and gradual progression for return to activity.   OBJECTIVE IMPAIRMENTS: Abnormal gait, decreased activity tolerance, decreased balance, decreased ROM, decreased  strength, impaired flexibility, postural dysfunction, and pain.   ACTIVITY LIMITATIONS: carrying, lifting, bending, sitting, standing, squatting, sleeping, stairs, transfers, bed mobility, locomotion level, and caring for others  PARTICIPATION LIMITATIONS: meal prep, cleaning, laundry, driving, shopping, and community activity  PERSONAL FACTORS: Fitness, Past/current experiences, Time since onset of injury/illness/exacerbation, and 3+ comorbidities: see PMH above are also affecting patient's functional outcome.    GOALS: Goals reviewed with patient? Yes  SHORT TERM GOALS: Target date: 09/26/2023  Patient will be I with initial HEP in order to progress with therapy. Baseline: None provided at eval 07/28/2023: patient reports independence with HEP Goal status: MET  2.  Patient will report low back and right leg pain </= 7/10 at worst to reduce functional limitations Baseline: 10/10 pain at worst 07/29/2023: reports pain at worst still 10/10 08/06/2023: continues to report pain 10/10 at worst 08/29/2023: continues to report pain 10/10 at worst Goal status: ONGOING  3.  Patient will be able to perform sit to stand without difficulty or using UE for support to improve mobility and indicate improvement in LE strength Baseline: requires UE for support with standing from chair 07/29/2023: able to perform sit to stand without UE support but exhibits difficulty  08/06/2023: able to perform sit to stand without UE support but exhibits difficulty due to knee pain 08/29/2023: patient can perform sit to stand without difficulty Goal status: MET  LONG TERM GOALS: Target date: 10/24/2023  Patient will be I with final HEP to maintain progress from PT. Baseline: None provided at eval 08/06/2023: progressing 08/29/2023: Goal status: ONGOING  2.  Patient will report Modified Oswestry </= 25/50 (50% disability) in order to indicate an improvement in their functional status Baseline: 37/50 (74%  disability) 08/06/2023: not assessed 08/29/2023: 33/50 (66%) Goal status: ONGOING  3.  Patient will demonstrate improvement in core and hip strength grossly >/= 4-/5 MMT in order to improve her activity tolerance with household tasks Baseline: see limitations above 08/06/2023: see limitations above 08/29/2023: see limitations above Goal status: ONGOING  4.  Patient will demonstrate right knee strength >/= 4+/5 MMT in order to improve stair negotiation and reduce fall risk due to right knee giving out Baseline: see limitations above 08/06/2023: see limitations above 08/29/2023: see limitations above Goal status: ONGOING  5. Patient will report lower back and right leg pain </= 4/10 in order to reduce functional limitations  Baseline: 10/10 at worst  08/06/2023: continues to report pain 10/10 at worst 08/29/2023: continues to report pain 10/10 at worst  Goal status: ONGOING   PLAN: PT FREQUENCY: 1-2x/week  PT DURATION: 8 weeks  PLANNED INTERVENTIONS: 97164- PT Re-evaluation, 97110-Therapeutic exercises, 97530- Therapeutic activity, 97112- Neuromuscular re-education, 97535- Self Care, 16109- Manual therapy, [id number]- Electrical stimulation (unattended), [phone]- Electrical stimulation (manual), Patient/Family education, Balance training, Taping, Dry Needling, Joint mobilization, Joint manipulation, Spinal manipulation, Spinal mobilization, Cryotherapy, and Moist heat.  PLAN FOR NEXT SESSION: Review HEP and progress PRN, continue with modalities for pain relief as needed, manual/TPDN for right lumbar and gluteal  region, LAD, gentle stretching and mobility for lumbar, gentle core activation, hip and LE strengthening as tolerated    Leah Primus, PT, DPT, LAT, ATC 09/02/23  11:54 AM Phone: [phone] Fax: [phone]

## 2023-09-04 ENCOUNTER — Encounter: Admitting: Physical Therapy

## 2023-09-05 ENCOUNTER — Encounter: Admitting: Physical Therapy

## 2023-09-09 ENCOUNTER — Ambulatory Visit (INDEPENDENT_AMBULATORY_CARE_PROVIDER_SITE_OTHER): Admitting: Physical Therapy

## 2023-09-09 ENCOUNTER — Other Ambulatory Visit: Payer: Self-pay

## 2023-09-09 ENCOUNTER — Encounter: Payer: Self-pay | Admitting: Physical Therapy

## 2023-09-09 DIAGNOSIS — M5459 Other low back pain: Secondary | ICD-10-CM

## 2023-09-09 DIAGNOSIS — M6281 Muscle weakness (generalized): Secondary | ICD-10-CM | POA: Diagnosis not present

## 2023-09-09 DIAGNOSIS — M79604 Pain in right leg: Secondary | ICD-10-CM | POA: Diagnosis not present

## 2023-09-09 NOTE — Therapy (Signed)
 OUTPATIENT PHYSICAL THERAPY TREATMENT   Patient Name: Erica [name] MRN: [id number] DOB:March 18, 1976, 48 y.o., female Today's Date: 09/09/2023   END OF SESSION:  PT End of Session - 09/09/23 1018     Visit Number 19    Number of Visits 34    Date for PT Re-Evaluation 10/24/23    Authorization Type [employer] MCR    Progress Note Due on Visit 20    PT Start Time 1016    PT Stop Time 1056    PT Time Calculation (min) 40 min    Activity Tolerance Patient tolerated treatment well    Behavior During Therapy WFL for tasks assessed/performed                               Past Medical History:  Diagnosis Date   Allergy     Anxiety    Apnea, sleep 05/04/2014   AHI 31/hr now on CPAP at 12cm H2O   Arthritis    Asthma    Asthma without status asthmaticus 01/19/2016   Axillary hidradenitis suppurativa 08/01/2011   Blood transfusion without reported diagnosis    Breast cancer (HCC) 2010   a. locally advanced left breast carcinoma diagnosed in 2010 and treated with neoadjuvant chemotherapy with docetaxel and cyclophosphamide as well as paclitaxel. This was followed by radiation therapy which was completed in 2011.   Chemotherapy-induced peripheral neuropathy (HCC) 12/09/2016   CHF (congestive heart failure) (HCC)    Chronic diastolic CHF (congestive heart failure) (HCC) 06/23/2013   Chronic kidney disease    Chronic pain in left foot 06/29/2018   Chronic stable angina (HCC) 08/22/2020   Cough variant asthma 09/27/2010   Followed in Pulmonary clinic/ [facility]/ [facility]  Onset reported in childhood  - PFT's 01/31/2011 FEV1  1.76 (60% ) ratio 68 and no better p B2,  DLCO 70% -med review 09/25/2012 > [facility]  drug assistance  paperwork given    - HFA 90% 11/20/2012 .  -Med calendar 12/07/12 , 04/06/2013 , 04/18/2014,, 08/30/2014 , 11/08/2014  - PFT's 01/12/2013  1.73 (65%)  Ratio 73 and no better p B2, DLCO 86% - trial off    Depression with somatization 06/15/2015   Dyspnea on  exertion    a. 01/2013 Lexi MV: EF 54%, no ischemia/infarct;  b. 05/2013 Echo: EF 60-65%, no rwma, Gr 2 DD;  b.    Essential hypertension 10/09/2014   Estimated Creatinine Clearance: 82.4 mL/min (by C-G formula based on SCr of 0.98 mg/dL).   Fatty liver    Gastroparesis    GERD (gastroesophageal reflux disease)    Hyperglycemia 04/13/2011   Hypertension    Hypertensive heart disease 09/26/2015   Hypertriglyceridemia without hypercholesterolemia 06/15/2015   Framingham risk score is 1%    Idiopathic intracranial hypertension 10/21/2014   Impaired glucose tolerance 04/13/2011   Insomnia secondary to anxiety 04/05/2015   Lymphadenitis, chronic    restricted LEFT extremity   Malignant neoplasm of upper-outer quadrant of left breast in female, estrogen receptor negative (HCC) 01/07/2013   Stage III, Invasive Ductal, s/p partial Left Mastectomy/Lumpectomy (baseball sized) with Lymph Node dissection, had Chemo and RadRx     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   OSA (obstructive sleep apnea) 05/04/2014   AHI 31/hr now on CPAP at 12cm H2O   Oxygen deficiency    Uses 02 in CPAP   Persistent proteinuria 03/31/2018   Personal history of chemotherapy 2010   Personal history of  radiation therapy 2010   Resistant hypertension 09/17/2011       Sleep apnea    SVT (supraventricular tachycardia) (HCC) 08/22/2020   Tibialis posterior tendinitis, left 07/15/2018   Injected in March 10, 2019   Ulcer    Upper airway cough syndrome 10/09/2014   Onset around 2012 with assoc pseudowheeze MRI 05/18/15 > Paranasal sinuses are clear - 11/17/2015 rec increase h1 to 2 hs to help with noct cough  - Allergy  profile 11/17/2015 >  Eos 0.1 /  IgE  80 POS RAST  to grass, ragweed, cedar tree  - d/c acei 03/06/2018  - flare end of Jan 2020 in setting of ? Uri/ poorly controlled GERD - flare mid Oct 2020 with uri / assoc secondary gerd   Vitamin D  deficiency disease 03/31/2018   Past Surgical History:  Procedure  Laterality Date   BRAIN SURGERY     Shunt   BREAST LUMPECTOMY Left 09/2008   CARPAL TUNNEL RELEASE Left 2011   LYMPH NODE DISSECTION Left 2010   breast; 2 wks after breast lumpectomy   RENAL DENERVATION N/A 11/20/2022   Procedure: RENAL DENERVATION;  Surgeon: Wenona Hamilton, MD;  Location: [facility] IN[facility]ASIVE [facility];  Service: Cardiovascular;  Laterality: N/A;   RIGHT/LEFT HEART CATH AND CORONARY ANGIOGRAPHY N/A 05/04/2019   Procedure: RIGHT/LEFT HEART CATH AND CORONARY ANGIOGRAPHY;  Surgeon: Mardell Shade, MD;  Location: [facility];  Service: Cardiovascular;  Laterality: N/A;  Renal injection preformed    RIGHT/LEFT HEART CATH AND CORONARY ANGIOGRAPHY N/A 10/25/2020   Procedure: RIGHT/LEFT HEART CATH AND CORONARY ANGIOGRAPHY;  Surgeon: Arleen Lacer, MD;  Location: [facility] INVASIVE CV LAB;  Service: Cardiovascular;  Laterality: N/A;   TENDON RELEASE Right 2012   "hand"   TUBAL LIGATION  05/11/2011   Procedure: POST PARTUM TUBAL LIGATION;  Surgeon: Onnie Bilis, MD;  Location: [facility];  Service: Gynecology;  Laterality: Bilateral;  Induced for HTN   Patient Active Problem List   Diagnosis Date Noted   Prediabetes 06/13/2023   Moderate episode of recurrent major depressive disorder (HCC) 02/04/2023   GAD (generalized anxiety disorder) 02/04/2023   Chronic respiratory failure with hypoxia (HCC) 12/31/2021   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   Coronary artery disease    Chronic stable angina (HCC) 08/22/2020   SVT (supraventricular tachycardia) (HCC) 08/22/2020   Palpitations 02/28/2020   Right ankle pain 04/14/2019   OSA (obstructive sleep apnea) 10/01/2018   Tibialis posterior tendinitis, left 07/15/2018   Chronic pain in left foot 06/29/2018   Vitamin D  deficiency disease 03/31/2018   Persistent proteinuria 03/31/2018   Hyperlipidemia LDL goal <100 03/30/2018   Chemotherapy-induced peripheral neuropathy (HCC) 12/09/2016   Asthma in adult, mild persistent, uncomplicated 01/19/2016    Hypertensive heart disease 09/26/2015   Depression with somatization 06/15/2015   Hypertriglyceridemia without hypercholesterolemia 06/15/2015   Hypokalemia 06/15/2015   Insomnia secondary to anxiety 04/05/2015   Idiopathic intracranial hypertension 10/21/2014   Upper airway cough syndrome 10/09/2014   Malignant hypertension 10/09/2014   Morbid obesity due to excess calories (HCC) 10/09/2014   Apnea, sleep 05/04/2014   Acute on chronic diastolic CHF (congestive heart failure), NYHA class 2 (HCC) 05/03/2014   DOE (dyspnea on exertion) 07/14/2013   Acute on chronic diastolic heart failure (HCC) 06/23/2013   Malignant neoplasm of upper-outer quadrant of left breast in female, estrogen receptor negative (HCC) 01/07/2013   Gastroparesis 01/04/2013   Resistant hypertension 09/17/2011   Axillary hidradenitis suppurativa 08/01/2011   Preventative health care  04/13/2011   Hyperglycemia 04/13/2011   GERD (gastroesophageal reflux disease) 03/18/2011   Cough variant asthma 09/27/2010    PCP: Nonda Bays, FNP  REFERRING PROVIDER: Syliva Even, MD  REFERRING DIAG: Lumbar radiculopathy  Rationale for Evaluation and Treatment: Rehabilitation  THERAPY DIAG:  Other low back pain  Pain in right leg  Muscle weakness (generalized)  ONSET DATE: August 2024   SUBJECTIVE:     SUBJECTIVE STATEMENT: Patient reports her knee did good this weekend. She did have some headaches so did spend some time in bed this weekend recovering.   Eval: Patients reports she had renal denervation done in August 2024 and has been in pain ever since. Pain is located across the lower back, she was told it would hurt for 3-4 months but pain persisted in the lower back. The right knee pain started after in October or November, and it will keep her up at night. She states it feels heavy in her lower back, like labor pain that never stops. The pain will start in the right lower back region and then will feel  like lightning shooting down the right leg to the back and outside of the right knee, pain does not go past the knee. She leans on the bed for relief, and she states getting on her stomach gives her the most relief, but she has a lot of pain when getting up. She does use a heating pad as well. She reports that to help with the knee pain at night she has to have her leg up on the wall. She states she moves around a lot because sitting or standing for long periods will make it worse. Will occasionally have trouble bending down and she states the right leg has given out on her causing her to fall. She does have trouble going up/down stairs, she turns to the side when going down, and she also has breathing issues so when she goes up stairs to get inside she has to immediately get on oxygen. She reports inability to play with or lift her grandchildren and difficulty completing activities around her home for her son.  PERTINENT HISTORY:  See PMH above  PAIN:  Are you having pain? Yes:  NPRS scale: 5/10 currently, 10/10 at worst Pain location: Lower back and right leg/knee Pain description: Heavy, labor pains, constant Aggravating factors: Standing, sitting, activity Relieving factors: Lying on stomach, leaning on bed or counter, heat  PRECAUTIONS: Fall, patient reports she was told not to lift more than 10 lbs  FALLS:  Has patient fallen in last 6 months? Yes. Number of falls 3-4, right knee gives out or sharp pains could cause her to fall  PATIENT GOALS: Pain relief, get back to doing normal things   OBJECTIVE:  Note: Objective measures were completed at Evaluation unless otherwise noted. PATIENT SURVEYS:  Modified Oswestry 37/50 (74% disability)   08/29/2023: 33/50 (66%)  SENSATION: Patient reports neuropathy of feet and hands  MUSCLE LENGTH: Unable to fully assess, severe increase in right lower back and leg pain when attempting to assess hamstring length  POSTURE:  Rounded shoulder  posture, normal lumbar lordosis, weight shift toward left and patient frequently changing positions while seated for comfort  PALPATION: Exquisite tenderness to right lower lumbar paraspinals, right gluteal and TFL region, right lateral thigh and knee; moderate tenderness to left lumbar paraspinals  Unable to assess lumbar mobility  LUMBAR ROM:   AROM eval  Flexion   Extension   Right lateral  flexion   Left lateral flexion   Right rotation   Left rotation    (Blank rows = not tested)  LOWER EXTREMITY ROM:    Limitations in right hip ROM due to pain and guarding, right knee flexion limited due to pain  LOWER EXTREMITY MMT:    MMT Right eval Left eval Rt / Lt 07/08/2023 Right 07/22/2023 Right 07/31/2023 Rt / Lt 08/06/2023 Rt / Lt 08/29/2023  Hip flexion 3+ 4       Hip extension 2 3- 3 / 3   3 / 3+ 3 / 3+  Hip abduction 2     3 / 3+ 3 / 3+  Hip adduction         Hip internal rotation         Hip external rotation         Knee flexion 4- 4  4     Knee extension 4- 4+  4 4 4 4  / 5  Ankle dorsiflexion         Ankle plantarflexion         Ankle inversion         Ankle eversion          (Blank rows = not tested)  LUMBAR SPECIAL TESTS:  SLR positive on right  FUNCTIONAL TESTS:  Sit to stand: patient will difficulty standing from standard chair, requires UE for support with transferring from sit to stand 08/06/2023: patient able to stand from standard chair without support  GAIT: Assistive device utilized: None Level of assistance: Complete Independence Comments: Antalgic on right   TREATMENT [facility] Adult PT Treatment:                                                DATE: 09/09/2023 Recumbent bike L3 x 5 min to improve endurance and workload capacity Sit to stand 2 x 10 Seated pelvic tilts x 10 LAQ with 5# 3 x 10 each Standing hip abduction and extension with yellow at knees 2 x 10 each Seated hamstring curl with yellow 2 x 10 each  PATIENT EDUCATION:  Education details:  HEP Person educated: Patient Education method: Explanation, Demonstration, Tactile cues, Verbal cues Education comprehension: verbalized understanding, returned demonstration, verbal cues required, tactile cues required, and needs further education  HOME EXERCISE PROGRAM: Access Code: [email or web address]    ASSESSMENT: CLINICAL IMPRESSION: Patient tolerated therapy well with no adverse effects. Therapy continues to focus on progressing her LE strengthening with good tolerance. She reported right knee did feel better this visit but does continue to report pain with therapy. She demonstrated greatest difficulty with seated hamstring curls on right. No changes were made to her HEP this visit. Patient would benefit from continued skilled PT to progress mobility and strength in order to reduce pain and maximize functional ability.   Eval: Patient is a 48 y.o. female who was seen today for physical therapy evaluation and treatment for chronic lower back and right knee pain. Her symptoms do seem consistent with nonspecific lower back pain and right sided lumbar radiculopathy. Evaluation was limited this visit due to patient's high symptom severity and irritability level. She did become tearful during the evaluation. Trial of e-stim IFC combined with cold pack with patient reporting pain level reduction, and provided patient with information for purchasing home TENS unit for pain relief. Unable to provide patient  with HEP this visit due to inability to complete exercise without severe pain. Patient was provided extensive education on possible etiologies of her symptoms and treatments that could be utilized in therapy, and gradual progression for return to activity.   OBJECTIVE IMPAIRMENTS: Abnormal gait, decreased activity tolerance, decreased balance, decreased ROM, decreased strength, impaired flexibility, postural dysfunction, and pain.   ACTIVITY LIMITATIONS: carrying, lifting, bending, sitting, standing,  squatting, sleeping, stairs, transfers, bed mobility, locomotion level, and caring for others  PARTICIPATION LIMITATIONS: meal prep, cleaning, laundry, driving, shopping, and community activity  PERSONAL FACTORS: Fitness, Past/current experiences, Time since onset of injury/illness/exacerbation, and 3+ comorbidities: see PMH above are also affecting patient's functional outcome.    GOALS: Goals reviewed with patient? Yes  SHORT TERM GOALS: Target date: 09/26/2023  Patient will be I with initial HEP in order to progress with therapy. Baseline: None provided at eval 07/28/2023: patient reports independence with HEP Goal status: MET  2.  Patient will report low back and right leg pain </= 7/10 at worst to reduce functional limitations Baseline: 10/10 pain at worst 07/29/2023: reports pain at worst still 10/10 08/06/2023: continues to report pain 10/10 at worst 08/29/2023: continues to report pain 10/10 at worst Goal status: ONGOING  3.  Patient will be able to perform sit to stand without difficulty or using UE for support to improve mobility and indicate improvement in LE strength Baseline: requires UE for support with standing from chair 07/29/2023: able to perform sit to stand without UE support but exhibits difficulty  08/06/2023: able to perform sit to stand without UE support but exhibits difficulty due to knee pain 08/29/2023: patient can perform sit to stand without difficulty Goal status: MET  LONG TERM GOALS: Target date: 10/24/2023  Patient will be I with final HEP to maintain progress from PT. Baseline: None provided at eval 08/06/2023: progressing 08/29/2023: Goal status: ONGOING  2.  Patient will report Modified Oswestry </= 25/50 (50% disability) in order to indicate an improvement in their functional status Baseline: 37/50 (74% disability) 08/06/2023: not assessed 08/29/2023: 33/50 (66%) Goal status: ONGOING  3.  Patient will demonstrate improvement in core and hip strength  grossly >/= 4-/5 MMT in order to improve her activity tolerance with household tasks Baseline: see limitations above 08/06/2023: see limitations above 08/29/2023: see limitations above Goal status: ONGOING  4.  Patient will demonstrate right knee strength >/= 4+/5 MMT in order to improve stair negotiation and reduce fall risk due to right knee giving out Baseline: see limitations above 08/06/2023: see limitations above 08/29/2023: see limitations above Goal status: ONGOING  5. Patient will report lower back and right leg pain </= 4/10 in order to reduce functional limitations  Baseline: 10/10 at worst  08/06/2023: continues to report pain 10/10 at worst 08/29/2023: continues to report pain 10/10 at worst  Goal status: ONGOING   PLAN: PT FREQUENCY: 1-2x/week  PT DURATION: 8 weeks  PLANNED INTERVENTIONS: 97164- PT Re-evaluation, 97110-Therapeutic exercises, 97530- Therapeutic activity, 97112- Neuromuscular re-education, 97535- Self Care, 16109- Manual therapy, [id number]- Electrical stimulation (unattended), [phone]- Electrical stimulation (manual), Patient/Family education, Balance training, Taping, Dry Needling, Joint mobilization, Joint manipulation, Spinal manipulation, Spinal mobilization, Cryotherapy, and Moist heat.  PLAN FOR NEXT SESSION: Review HEP and progress PRN, continue with modalities for pain relief as needed, manual/TPDN for right lumbar and gluteal region, LAD, gentle stretching and mobility for lumbar, gentle core activation, hip and LE strengthening as tolerated    Leah Primus, PT, DPT, LAT, ATC 09/09/23  11:01 AM  Phone: [phone] Fax: [phone]

## 2023-09-11 ENCOUNTER — Encounter: Admitting: Physical Therapy

## 2023-09-16 ENCOUNTER — Ambulatory Visit (INDEPENDENT_AMBULATORY_CARE_PROVIDER_SITE_OTHER): Admitting: Physical Therapy

## 2023-09-16 ENCOUNTER — Encounter: Payer: Self-pay | Admitting: Physical Therapy

## 2023-09-16 ENCOUNTER — Other Ambulatory Visit: Payer: Self-pay

## 2023-09-16 DIAGNOSIS — M5459 Other low back pain: Secondary | ICD-10-CM

## 2023-09-16 DIAGNOSIS — M6281 Muscle weakness (generalized): Secondary | ICD-10-CM | POA: Diagnosis not present

## 2023-09-16 DIAGNOSIS — M79604 Pain in right leg: Secondary | ICD-10-CM

## 2023-09-16 NOTE — Therapy (Addendum)
 OUTPATIENT PHYSICAL THERAPY TREATMENT  Progress Note Reporting Period 07/03/2023 to 09/16/2023  See note below for Objective Data and Assessment of Progress/Goals.     Patient Name: Erica [name] MRN: [id number] DOB:1975-11-12, 48 y.o., female Today's Date: 09/16/2023   END OF SESSION:  PT End of Session - 09/16/23 1020     Visit Number 20    Number of Visits 34    Date for PT Re-Evaluation 10/24/23    Authorization Type [employer] MCR    Progress Note Due on Visit 30    PT Start Time 1016    PT Stop Time 1057    PT Time Calculation (min) 41 min    Activity Tolerance Patient tolerated treatment well    Behavior During Therapy WFL for tasks assessed/performed                             Past Medical History:  Diagnosis Date   Allergy     Anxiety    Apnea, sleep 05/04/2014   AHI 31/hr now on CPAP at 12cm H2O   Arthritis    Asthma    Asthma without status asthmaticus 01/19/2016   Axillary hidradenitis suppurativa 08/01/2011   Blood transfusion without reported diagnosis    Breast cancer (HCC) 2010   a. locally advanced left breast carcinoma diagnosed in 2010 and treated with neoadjuvant chemotherapy with docetaxel and cyclophosphamide as well as paclitaxel. This was followed by radiation therapy which was completed in 2011.   Chemotherapy-induced peripheral neuropathy (HCC) 12/09/2016   CHF (congestive heart failure) (HCC)    Chronic diastolic CHF (congestive heart failure) (HCC) 06/23/2013   Chronic kidney disease    Chronic pain in left foot 06/29/2018   Chronic stable angina (HCC) 08/22/2020   Cough variant asthma 09/27/2010   Followed in Pulmonary clinic/ [facility]/ [place]  Onset reported in childhood  - PFT's 01/31/2011 FEV1  1.76 (60% ) ratio 68 and no better p B2,  DLCO 70% -med review 09/25/2012 > [facility]  drug assistance  paperwork given    - HFA 90% 11/20/2012 .  -Med calendar 12/07/12 , 04/06/2013 , 04/18/2014,, 08/30/2014 , 11/08/2014  - PFT's 01/12/2013   1.73 (65%)  Ratio 73 and no better p B2, DLCO 86% - trial off    Depression with somatization 06/15/2015   Dyspnea on exertion    a. 01/2013 Lexi MV: EF 54%, no ischemia/infarct;  b. 05/2013 Echo: EF 60-65%, no rwma, Gr 2 DD;  b.    Essential hypertension 10/09/2014   Estimated Creatinine Clearance: 82.4 mL/min (by C-G formula based on SCr of 0.98 mg/dL).   Fatty liver    Gastroparesis    GERD (gastroesophageal reflux disease)    Hyperglycemia 04/13/2011   Hypertension    Hypertensive heart disease 09/26/2015   Hypertriglyceridemia without hypercholesterolemia 06/15/2015   Framingham risk score is 1%    Idiopathic intracranial hypertension 10/21/2014   Impaired glucose tolerance 04/13/2011   Insomnia secondary to anxiety 04/05/2015   Lymphadenitis, chronic    restricted LEFT extremity   Malignant neoplasm of upper-outer quadrant of left breast in female, estrogen receptor negative (HCC) 01/07/2013   Stage III, Invasive Ductal, s/p partial Left Mastectomy/Lumpectomy (baseball sized) with Lymph Node dissection, had Chemo and RadRx     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   OSA (obstructive sleep apnea) 05/04/2014   AHI 31/hr now on CPAP at 12cm H2O   Oxygen deficiency    Uses 02  in CPAP   Persistent proteinuria 03/31/2018   Personal history of chemotherapy 2010   Personal history of radiation therapy 2010   Resistant hypertension 09/17/2011       Sleep apnea    SVT (supraventricular tachycardia) (HCC) 08/22/2020   Tibialis posterior tendinitis, left 07/15/2018   Injected in March 10, 2019   Ulcer    Upper airway cough syndrome 10/09/2014   Onset around 2012 with assoc pseudowheeze MRI 05/18/15 > Paranasal sinuses are clear - 11/17/2015 rec increase h1 to 2 hs to help with noct cough  - Allergy  profile 11/17/2015 >  Eos 0.1 /  IgE  80 POS RAST  to grass, ragweed, cedar tree  - d/c acei 03/06/2018  - flare end of Jan 2020 in setting of ? Uri/ poorly controlled GERD - flare mid Oct  2020 with uri / assoc secondary gerd   Vitamin D  deficiency disease 03/31/2018   Past Surgical History:  Procedure Laterality Date   BRAIN SURGERY     Shunt   BREAST LUMPECTOMY Left 09/2008   CARPAL TUNNEL RELEASE Left 2011   LYMPH NODE DISSECTION Left 2010   breast; 2 wks after breast lumpectomy   RENAL DENERVATION N/A 11/20/2022   Procedure: RENAL DENERVATION;  Surgeon: Wenona Hamilton, MD;  Location: [facility]IVE [facility];  Service: Cardiovascular;  Laterality: N/A;   RIGHT/LEFT HEART CATH AND CORONARY ANGIOGRAPHY N/A 05/04/2019   Procedure: RIGHT/LEFT HEART CATH AND CORONARY ANGIOGRAPHY;  Surgeon: Mardell Shade, MD;  Location: [facility];  Service: Cardiovascular;  Laterality: N/A;  Renal injection preformed    RIGHT/LEFT HEART CATH AND CORONARY ANGIOGRAPHY N/A 10/25/2020   Procedure: RIGHT/LEFT HEART CATH AND CORONARY ANGIOGRAPHY;  Surgeon: Arleen Lacer, MD;  Location: [facility] INVASIVE CV LAB;  Service: Cardiovascular;  Laterality: N/A;   TENDON RELEASE Right 2012   hand   TUBAL LIGATION  05/11/2011   Procedure: POST PARTUM TUBAL LIGATION;  Surgeon: Onnie Bilis, MD;  Location: [facility];  Service: Gynecology;  Laterality: Bilateral;  Induced for HTN   Patient Active Problem List   Diagnosis Date Noted   Prediabetes 06/13/2023   Moderate episode of recurrent major depressive disorder (HCC) 02/04/2023   GAD (generalized anxiety disorder) 02/04/2023   Chronic respiratory failure with hypoxia (HCC) 12/31/2021   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   Coronary artery disease    Chronic stable angina (HCC) 08/22/2020   SVT (supraventricular tachycardia) (HCC) 08/22/2020   Palpitations 02/28/2020   Right ankle pain 04/14/2019   OSA (obstructive sleep apnea) 10/01/2018   Tibialis posterior tendinitis, left 07/15/2018   Chronic pain in left foot 06/29/2018   Vitamin D  deficiency disease 03/31/2018   Persistent proteinuria 03/31/2018   Hyperlipidemia LDL goal <100 03/30/2018    Chemotherapy-induced peripheral neuropathy (HCC) 12/09/2016   Asthma in adult, mild persistent, uncomplicated 01/19/2016   Hypertensive heart disease 09/26/2015   Depression with somatization 06/15/2015   Hypertriglyceridemia without hypercholesterolemia 06/15/2015   Hypokalemia 06/15/2015   Insomnia secondary to anxiety 04/05/2015   Idiopathic intracranial hypertension 10/21/2014   Upper airway cough syndrome 10/09/2014   Malignant hypertension 10/09/2014   Morbid obesity due to excess calories (HCC) 10/09/2014   Apnea, sleep 05/04/2014   Acute on chronic diastolic CHF (congestive heart failure), NYHA class 2 (HCC) 05/03/2014   DOE (dyspnea on exertion) 07/14/2013   Acute on chronic diastolic heart failure (HCC) 06/23/2013   Malignant neoplasm of upper-outer quadrant of left breast in female, estrogen receptor negative (HCC) 01/07/2013  Gastroparesis 01/04/2013   Resistant hypertension 09/17/2011   Axillary hidradenitis suppurativa 08/01/2011   Preventative health care 04/13/2011   Hyperglycemia 04/13/2011   GERD (gastroesophageal reflux disease) 03/18/2011   Cough variant asthma 09/27/2010    PCP: Nonda Bays, FNP  REFERRING PROVIDER: Syliva Even, MD  REFERRING DIAG: Lumbar radiculopathy  Rationale for Evaluation and Treatment: Rehabilitation  THERAPY DIAG:  Other low back pain  Pain in right leg  Muscle weakness (generalized)  ONSET DATE: August 2024   SUBJECTIVE:     SUBJECTIVE STATEMENT: Patient reports she is doing alright. States the knee is not good today. States she still gets pain up to 10/10 but when that happened recently it was a lot more than just her knee and back.   Eval: Patients reports she had renal denervation done in August 2024 and has been in pain ever since. Pain is located across the lower back, she was told it would hurt for 3-4 months but pain persisted in the lower back. The right knee pain started after in October or November,  and it will keep her up at night. She states it feels heavy in her lower back, like labor pain that never stops. The pain will start in the right lower back region and then will feel like lightning shooting down the right leg to the back and outside of the right knee, pain does not go past the knee. She leans on the bed for relief, and she states getting on her stomach gives her the most relief, but she has a lot of pain when getting up. She does use a heating pad as well. She reports that to help with the knee pain at night she has to have her leg up on the wall. She states she moves around a lot because sitting or standing for long periods will make it worse. Will occasionally have trouble bending down and she states the right leg has given out on her causing her to fall. She does have trouble going up/down stairs, she turns to the side when going down, and she also has breathing issues so when she goes up stairs to get inside she has to immediately get on oxygen. She reports inability to play with or lift her grandchildren and difficulty completing activities around her home for her son.  PERTINENT HISTORY:  See PMH above  PAIN:  Are you having pain? Yes:  NPRS scale: 8/10 currently, 10/10 at worst Pain location: Lower back and right leg/knee Pain description: Heavy, labor pains, constant Aggravating factors: Standing, sitting, activity Relieving factors: Lying on stomach, leaning on bed or counter, heat  PRECAUTIONS: Fall, patient reports she was told not to lift more than 10 lbs  FALLS:  Has patient fallen in last 6 months? Yes. Number of falls 3-4, right knee gives out or sharp pains could cause her to fall  PATIENT GOALS: Pain relief, get back to doing normal things   OBJECTIVE:  Note: Objective measures were completed at Evaluation unless otherwise noted. PATIENT SURVEYS:  Modified Oswestry 37/50 (74% disability)   08/29/2023: 33/50 (66%)  SENSATION: Patient reports neuropathy of  feet and hands  MUSCLE LENGTH: Unable to fully assess, severe increase in right lower back and leg pain when attempting to assess hamstring length  POSTURE:  Rounded shoulder posture, normal lumbar lordosis, weight shift toward left and patient frequently changing positions while seated for comfort  PALPATION: Exquisite tenderness to right lower lumbar paraspinals, right gluteal and TFL region, right  lateral thigh and knee; moderate tenderness to left lumbar paraspinals  Unable to assess lumbar mobility  LUMBAR ROM:   AROM eval  Flexion   Extension   Right lateral flexion   Left lateral flexion   Right rotation   Left rotation    (Blank rows = not tested)  LOWER EXTREMITY ROM:    Limitations in right hip ROM due to pain and guarding, right knee flexion limited due to pain  LOWER EXTREMITY MMT:    MMT Right eval Left eval Rt / Lt 07/08/2023 Right 07/22/2023 Right 07/31/2023 Rt / Lt 08/06/2023 Rt / Lt 08/29/2023  Hip flexion 3+ 4       Hip extension 2 3- 3 / 3   3 / 3+ 3 / 3+  Hip abduction 2     3 / 3+ 3 / 3+  Hip adduction         Hip internal rotation         Hip external rotation         Knee flexion 4- 4  4     Knee extension 4- 4+  4 4 4 4  / 5  Ankle dorsiflexion         Ankle plantarflexion         Ankle inversion         Ankle eversion          (Blank rows = not tested)  LUMBAR SPECIAL TESTS:  SLR positive on right  FUNCTIONAL TESTS:  Sit to stand: patient will difficulty standing from standard chair, requires UE for support with transferring from sit to stand 08/06/2023: patient able to stand from standard chair without support  GAIT: Assistive device utilized: None Level of assistance: Complete Independence Comments: Antalgic on right   TREATMENT [facility] Adult PT Treatment:                                                DATE: 09/16/2023 Recumbent bike L3 x 5 min to improve endurance and workload capacity KT tape for right knee proprioceptive patellar and  knee control Sit to stand 2 x 10 Seated pelvic tilts x 10 Slant board calf stretch 5 x 15 sec Standing hip abduction and extension with yellow at knees 2 x 10 each Standing TKE with yellow 2 x 10  PATIENT EDUCATION:  Education details: HEP Person educated: Patient Education method: Programmer, multimedia, Demonstration, Actor cues, Verbal cues Education comprehension: verbalized understanding, returned demonstration, verbal cues required, tactile cues required, and needs further education  HOME EXERCISE PROGRAM: Access Code: [email or web address]    ASSESSMENT: CLINICAL IMPRESSION: Patient tolerated therapy well with no adverse effects. She did arrive reporting increase in right knee this visit so utilized KT tape with good therapeutic benefit. Therapy continued to focus primarily on strengthening for the LE. She does report feeling shaky when weight bearing through the right leg but able to complete all prescribed exercises. No changes made to her HEP this visit. Patient would benefit from continued skilled PT to progress mobility and strength in order to reduce pain and maximize functional ability.   Eval: Patient is a 48 y.o. female who was seen today for physical therapy evaluation and treatment for chronic lower back and right knee pain. Her symptoms do seem consistent with nonspecific lower back pain and right sided lumbar radiculopathy. Evaluation was limited this  visit due to patient's high symptom severity and irritability level. She did become tearful during the evaluation. Trial of e-stim IFC combined with cold pack with patient reporting pain level reduction, and provided patient with information for purchasing home TENS unit for pain relief. Unable to provide patient with HEP this visit due to inability to complete exercise without severe pain. Patient was provided extensive education on possible etiologies of her symptoms and treatments that could be utilized in therapy, and gradual progression for  return to activity.   OBJECTIVE IMPAIRMENTS: Abnormal gait, decreased activity tolerance, decreased balance, decreased ROM, decreased strength, impaired flexibility, postural dysfunction, and pain.   ACTIVITY LIMITATIONS: carrying, lifting, bending, sitting, standing, squatting, sleeping, stairs, transfers, bed mobility, locomotion level, and caring for others  PARTICIPATION LIMITATIONS: meal prep, cleaning, laundry, driving, shopping, and community activity  PERSONAL FACTORS: Fitness, Past/current experiences, Time since onset of injury/illness/exacerbation, and 3+ comorbidities: see PMH above are also affecting patient's functional outcome.    GOALS: Goals reviewed with patient? Yes  SHORT TERM GOALS: Target date: 09/26/2023  Patient will be I with initial HEP in order to progress with therapy. Baseline: None provided at eval 07/28/2023: patient reports independence with HEP Goal status: MET  2.  Patient will report low back and right leg pain </= 7/10 at worst to reduce functional limitations Baseline: 10/10 pain at worst 07/29/2023: reports pain at worst still 10/10 08/06/2023: continues to report pain 10/10 at worst 08/29/2023: continues to report pain 10/10 at worst 09/16/2023: continues to report pain 10/10 at worst Goal status: ONGOING  3.  Patient will be able to perform sit to stand without difficulty or using UE for support to improve mobility and indicate improvement in LE strength Baseline: requires UE for support with standing from chair 07/29/2023: able to perform sit to stand without UE support but exhibits difficulty  08/06/2023: able to perform sit to stand without UE support but exhibits difficulty due to knee pain 08/29/2023: patient can perform sit to stand without difficulty Goal status: MET  LONG TERM GOALS: Target date: 10/24/2023  Patient will be I with final HEP to maintain progress from PT. Baseline: None provided at eval 08/06/2023: progressing 08/29/2023:  progressing Goal status: ONGOING  2.  Patient will report Modified Oswestry </= 25/50 (50% disability) in order to indicate an improvement in their functional status Baseline: 37/50 (74% disability) 08/06/2023: not assessed 08/29/2023: 33/50 (66%) Goal status: ONGOING  3.  Patient will demonstrate improvement in core and hip strength grossly >/= 4-/5 MMT in order to improve her activity tolerance with household tasks Baseline: see limitations above 08/06/2023: see limitations above 08/29/2023: see limitations above Goal status: ONGOING  4.  Patient will demonstrate right knee strength >/= 4+/5 MMT in order to improve stair negotiation and reduce fall risk due to right knee giving out Baseline: see limitations above 08/06/2023: see limitations above 08/29/2023: see limitations above Goal status: ONGOING  5. Patient will report lower back and right leg pain </= 4/10 in order to reduce functional limitations  Baseline: 10/10 at worst  08/06/2023: continues to report pain 10/10 at worst 08/29/2023: continues to report pain 10/10 at worst 09/16/2023: continues to report pain 10/10 at worst  Goal status: ONGOING   PLAN: PT FREQUENCY: 1-2x/week  PT DURATION: 8 weeks  PLANNED INTERVENTIONS: 97164- PT Re-evaluation, 97110-Therapeutic exercises, 97530- Therapeutic activity, 97112- Neuromuscular re-education, 97535- Self Care, [id number]- Manual therapy, [id number]- Electrical stimulation (unattended), 91478- Electrical stimulation (manual), Patient/Family education, Balance training, Taping, Dry Needling, Joint  mobilization, Joint manipulation, Spinal manipulation, Spinal mobilization, Cryotherapy, and Moist heat.  PLAN FOR NEXT SESSION: Review HEP and progress PRN, continue with modalities for pain relief as needed, manual/TPDN for right lumbar and gluteal region, LAD, gentle stretching and mobility for lumbar, gentle core activation, hip and LE strengthening as tolerated    Leah Primus, PT, DPT, LAT,  ATC 09/16/23  10:57 AM Phone: [phone] Fax: [phone]

## 2023-09-18 ENCOUNTER — Other Ambulatory Visit: Payer: Self-pay

## 2023-09-18 ENCOUNTER — Ambulatory Visit (INDEPENDENT_AMBULATORY_CARE_PROVIDER_SITE_OTHER): Admitting: Physical Therapy

## 2023-09-18 ENCOUNTER — Encounter: Payer: Self-pay | Admitting: Physical Therapy

## 2023-09-18 DIAGNOSIS — M6281 Muscle weakness (generalized): Secondary | ICD-10-CM

## 2023-09-18 DIAGNOSIS — M5459 Other low back pain: Secondary | ICD-10-CM | POA: Diagnosis not present

## 2023-09-18 DIAGNOSIS — M79604 Pain in right leg: Secondary | ICD-10-CM

## 2023-09-18 NOTE — Therapy (Signed)
 OUTPATIENT PHYSICAL THERAPY TREATMENT  Progress Note Reporting Period 07/03/2023 to 09/16/2023  See note below for Objective Data and Assessment of Progress/Goals.     Patient Name: Erica [name] MRN: [id number] DOB:23-Apr-1975, 48 y.o., female Today's Date: 09/18/2023   END OF SESSION:  PT End of Session - 09/18/23 1021     Visit Number 21    Number of Visits 34    Date for PT Re-Evaluation 10/24/23    Authorization Type [employer] MCR    Progress Note Due on Visit 30    PT Start Time 1016    PT Stop Time 1055    PT Time Calculation (min) 39 min    Activity Tolerance Patient tolerated treatment well    Behavior During Therapy WFL for tasks assessed/performed                              Past Medical History:  Diagnosis Date   Allergy     Anxiety    Apnea, sleep 05/04/2014   AHI 31/hr now on CPAP at 12cm H2O   Arthritis    Asthma    Asthma without status asthmaticus 01/19/2016   Axillary hidradenitis suppurativa 08/01/2011   Blood transfusion without reported diagnosis    Breast cancer (HCC) 2010   a. locally advanced left breast carcinoma diagnosed in 2010 and treated with neoadjuvant chemotherapy with docetaxel and cyclophosphamide as well as paclitaxel. This was followed by radiation therapy which was completed in 2011.   Chemotherapy-induced peripheral neuropathy (HCC) 12/09/2016   CHF (congestive heart failure) (HCC)    Chronic diastolic CHF (congestive heart failure) (HCC) 06/23/2013   Chronic kidney disease    Chronic pain in left foot 06/29/2018   Chronic stable angina (HCC) 08/22/2020   Cough variant asthma 09/27/2010   Followed in Pulmonary clinic/ [facility]/ [place]  Onset reported in childhood  - PFT's 01/31/2011 FEV1  1.76 (60% ) ratio 68 and no better p B2,  DLCO 70% -med review 09/25/2012 > [place]  drug assistance  paperwork given    - HFA 90% 11/20/2012 .  -Med calendar 12/07/12 , 04/06/2013 , 04/18/2014,, 08/30/2014 , 11/08/2014  - PFT's  01/12/2013  1.73 (65%)  Ratio 73 and no better p B2, DLCO 86% - trial off    Depression with somatization 06/15/2015   Dyspnea on exertion    a. 01/2013 Lexi MV: EF 54%, no ischemia/infarct;  b. 05/2013 Echo: EF 60-65%, no rwma, Gr 2 DD;  b.    Essential hypertension 10/09/2014   Estimated Creatinine Clearance: 82.4 mL/min (by C-G formula based on SCr of 0.98 mg/dL).   Fatty liver    Gastroparesis    GERD (gastroesophageal reflux disease)    Hyperglycemia 04/13/2011   Hypertension    Hypertensive heart disease 09/26/2015   Hypertriglyceridemia without hypercholesterolemia 06/15/2015   Framingham risk score is 1%    Idiopathic intracranial hypertension 10/21/2014   Impaired glucose tolerance 04/13/2011   Insomnia secondary to anxiety 04/05/2015   Lymphadenitis, chronic    restricted LEFT extremity   Malignant neoplasm of upper-outer quadrant of left breast in female, estrogen receptor negative (HCC) 01/07/2013   Stage III, Invasive Ductal, s/p partial Left Mastectomy/Lumpectomy (baseball sized) with Lymph Node dissection, had Chemo and RadRx     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   OSA (obstructive sleep apnea) 05/04/2014   AHI 31/hr now on CPAP at 12cm H2O   Oxygen deficiency    Uses  02 in CPAP   Persistent proteinuria 03/31/2018   Personal history of chemotherapy 2010   Personal history of radiation therapy 2010   Resistant hypertension 09/17/2011       Sleep apnea    SVT (supraventricular tachycardia) (HCC) 08/22/2020   Tibialis posterior tendinitis, left 07/15/2018   Injected in March 10, 2019   Ulcer    Upper airway cough syndrome 10/09/2014   Onset around 2012 with assoc pseudowheeze MRI 05/18/15 > Paranasal sinuses are clear - 11/17/2015 rec increase h1 to 2 hs to help with noct cough  - Allergy  profile 11/17/2015 >  Eos 0.1 /  IgE  80 POS RAST  to grass, ragweed, cedar tree  - d/c acei 03/06/2018  - flare end of Jan 2020 in setting of ? Uri/ poorly controlled GERD -  flare mid Oct 2020 with uri / assoc secondary gerd   Vitamin D  deficiency disease 03/31/2018   Past Surgical History:  Procedure Laterality Date   BRAIN SURGERY     Shunt   BREAST LUMPECTOMY Left 09/2008   CARPAL TUNNEL RELEASE Left 2011   LYMPH NODE DISSECTION Left 2010   breast; 2 wks after breast lumpectomy   RENAL DENERVATION N/A 11/20/2022   Procedure: RENAL DENERVATION;  Surgeon: Wenona Hamilton, MD;  Location: [facility] IN[facility]ASIVE [facility];  Service: Cardiovascular;  Laterality: N/A;   RIGHT/LEFT HEART CATH AND CORONARY ANGIOGRAPHY N/A 05/04/2019   Procedure: RIGHT/LEFT HEART CATH AND CORONARY ANGIOGRAPHY;  Surgeon: Mardell Shade, MD;  Location: [facility];  Service: Cardiovascular;  Laterality: N/A;  Renal injection preformed    RIGHT/LEFT HEART CATH AND CORONARY ANGIOGRAPHY N/A 10/25/2020   Procedure: RIGHT/LEFT HEART CATH AND CORONARY ANGIOGRAPHY;  Surgeon: Arleen Lacer, MD;  Location: [facility] INVASIVE CV LAB;  Service: Cardiovascular;  Laterality: N/A;   TENDON RELEASE Right 2012   hand   TUBAL LIGATION  05/11/2011   Procedure: POST PARTUM TUBAL LIGATION;  Surgeon: Onnie Bilis, MD;  Location: [facility];  Service: Gynecology;  Laterality: Bilateral;  Induced for HTN   Patient Active Problem List   Diagnosis Date Noted   Prediabetes 06/13/2023   Moderate episode of recurrent major depressive disorder (HCC) 02/04/2023   GAD (generalized anxiety disorder) 02/04/2023   Chronic respiratory failure with hypoxia (HCC) 12/31/2021   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   Coronary artery disease    Chronic stable angina (HCC) 08/22/2020   SVT (supraventricular tachycardia) (HCC) 08/22/2020   Palpitations 02/28/2020   Right ankle pain 04/14/2019   OSA (obstructive sleep apnea) 10/01/2018   Tibialis posterior tendinitis, left 07/15/2018   Chronic pain in left foot 06/29/2018   Vitamin D  deficiency disease 03/31/2018   Persistent proteinuria 03/31/2018   Hyperlipidemia LDL goal <100 03/30/2018    Chemotherapy-induced peripheral neuropathy (HCC) 12/09/2016   Asthma in adult, mild persistent, uncomplicated 01/19/2016   Hypertensive heart disease 09/26/2015   Depression with somatization 06/15/2015   Hypertriglyceridemia without hypercholesterolemia 06/15/2015   Hypokalemia 06/15/2015   Insomnia secondary to anxiety 04/05/2015   Idiopathic intracranial hypertension 10/21/2014   Upper airway cough syndrome 10/09/2014   Malignant hypertension 10/09/2014   Morbid obesity due to excess calories (HCC) 10/09/2014   Apnea, sleep 05/04/2014   Acute on chronic diastolic CHF (congestive heart failure), NYHA class 2 (HCC) 05/03/2014   DOE (dyspnea on exertion) 07/14/2013   Acute on chronic diastolic heart failure (HCC) 06/23/2013   Malignant neoplasm of upper-outer quadrant of left breast in female, estrogen receptor negative (HCC) 01/07/2013  Gastroparesis 01/04/2013   Resistant hypertension 09/17/2011   Axillary hidradenitis suppurativa 08/01/2011   Preventative health care 04/13/2011   Hyperglycemia 04/13/2011   GERD (gastroesophageal reflux disease) 03/18/2011   Cough variant asthma 09/27/2010    PCP: Nonda Bays, FNP  REFERRING PROVIDER: Syliva Even, MD  REFERRING DIAG: Lumbar radiculopathy  Rationale for Evaluation and Treatment: Rehabilitation  THERAPY DIAG:  Other low back pain  Pain in right leg  Muscle weakness (generalized)  ONSET DATE: August 2024   SUBJECTIVE:     SUBJECTIVE STATEMENT: Patient reports she is doing alright. States the knee is not good today. States she still gets pain up to 10/10 but when that happened recently it was a lot more than just her knee and back.   Eval: Patients reports she had renal denervation done in August 2024 and has been in pain ever since. Pain is located across the lower back, she was told it would hurt for 3-4 months but pain persisted in the lower back. The right knee pain started after in October or November,  and it will keep her up at night. She states it feels heavy in her lower back, like labor pain that never stops. The pain will start in the right lower back region and then will feel like lightning shooting down the right leg to the back and outside of the right knee, pain does not go past the knee. She leans on the bed for relief, and she states getting on her stomach gives her the most relief, but she has a lot of pain when getting up. She does use a heating pad as well. She reports that to help with the knee pain at night she has to have her leg up on the wall. She states she moves around a lot because sitting or standing for long periods will make it worse. Will occasionally have trouble bending down and she states the right leg has given out on her causing her to fall. She does have trouble going up/down stairs, she turns to the side when going down, and she also has breathing issues so when she goes up stairs to get inside she has to immediately get on oxygen. She reports inability to play with or lift her grandchildren and difficulty completing activities around her home for her son.  PERTINENT HISTORY:  See PMH above  PAIN:  Are you having pain? Yes:  NPRS scale: 8/10 currently, 10/10 at worst Pain location: Lower back and right leg/knee Pain description: Heavy, labor pains, constant Aggravating factors: Standing, sitting, activity Relieving factors: Lying on stomach, leaning on bed or counter, heat  PRECAUTIONS: Fall, patient reports she was told not to lift more than 10 lbs  FALLS:  Has patient fallen in last 6 months? Yes. Number of falls 3-4, right knee gives out or sharp pains could cause her to fall  PATIENT GOALS: Pain relief, get back to doing normal things   OBJECTIVE:  Note: Objective measures were completed at Evaluation unless otherwise noted. PATIENT SURVEYS:  Modified Oswestry 37/50 (74% disability)   08/29/2023: 33/50 (66%)  SENSATION: Patient reports neuropathy of  feet and hands  MUSCLE LENGTH: Unable to fully assess, severe increase in right lower back and leg pain when attempting to assess hamstring length  POSTURE:  Rounded shoulder posture, normal lumbar lordosis, weight shift toward left and patient frequently changing positions while seated for comfort  PALPATION: Exquisite tenderness to right lower lumbar paraspinals, right gluteal and TFL region, right  lateral thigh and knee; moderate tenderness to left lumbar paraspinals  Unable to assess lumbar mobility  LUMBAR ROM:   AROM eval  Flexion   Extension   Right lateral flexion   Left lateral flexion   Right rotation   Left rotation    (Blank rows = not tested)  LOWER EXTREMITY ROM:    Limitations in right hip ROM due to pain and guarding, right knee flexion limited due to pain  LOWER EXTREMITY MMT:    MMT Right eval Left eval Rt / Lt 07/08/2023 Right 07/22/2023 Right 07/31/2023 Rt / Lt 08/06/2023 Rt / Lt 08/29/2023  Hip flexion 3+ 4       Hip extension 2 3- 3 / 3   3 / 3+ 3 / 3+  Hip abduction 2     3 / 3+ 3 / 3+  Hip adduction         Hip internal rotation         Hip external rotation         Knee flexion 4- 4  4     Knee extension 4- 4+  4 4 4 4  / 5  Ankle dorsiflexion         Ankle plantarflexion         Ankle inversion         Ankle eversion          (Blank rows = not tested)  LUMBAR SPECIAL TESTS:  SLR positive on right  FUNCTIONAL TESTS:  Sit to stand: patient will difficulty standing from standard chair, requires UE for support with transferring from sit to stand 08/06/2023: patient able to stand from standard chair without support  GAIT: Assistive device utilized: None Level of assistance: Complete Independence Comments: Antalgic on right   TREATMENT [facility] Adult PT Treatment:                                                DATE: 09/18/2023 Recumbent bike L3 x 5 min to improve endurance and workload capacity LAQ with 5# 3 x 10 each Sit to stand 2 x 10 TRX  squat x 10 Forward 4 step-up x 15 on right Standing hip abduction and extension with red at knees 2 x 10 each Seated pelvic tilts x 10  PATIENT EDUCATION:  Education details: HEP Person educated: Patient Education method: Programmer, multimedia, Demonstration, Actor cues, Verbal cues Education comprehension: verbalized understanding, returned demonstration, verbal cues required, tactile cues required, and needs further education  HOME EXERCISE PROGRAM: Access Code: [email or web address]    ASSESSMENT: CLINICAL IMPRESSION: Patient tolerated therapy well with no adverse effects. Therapy focused on progressing LE strengthening with good tolerance. Incorporated supported TRX squats to improve control and also forward step-ups. She did require use of UE for support with step-ups and required cueing to ensure she was bending her knee. Continued to work on hip strengthening with increased banded resistance this visit. No changes were made to her HEP. Patient would benefit from continued skilled PT to progress mobility and strength in order to reduce pain and maximize functional ability.   Eval: Patient is a 48 y.o. female who was seen today for physical therapy evaluation and treatment for chronic lower back and right knee pain. Her symptoms do seem consistent with nonspecific lower back pain and right sided lumbar radiculopathy. Evaluation was limited this visit due to  patient's high symptom severity and irritability level. She did become tearful during the evaluation. Trial of e-stim IFC combined with cold pack with patient reporting pain level reduction, and provided patient with information for purchasing home TENS unit for pain relief. Unable to provide patient with HEP this visit due to inability to complete exercise without severe pain. Patient was provided extensive education on possible etiologies of her symptoms and treatments that could be utilized in therapy, and gradual progression for return to activity.    OBJECTIVE IMPAIRMENTS: Abnormal gait, decreased activity tolerance, decreased balance, decreased ROM, decreased strength, impaired flexibility, postural dysfunction, and pain.   ACTIVITY LIMITATIONS: carrying, lifting, bending, sitting, standing, squatting, sleeping, stairs, transfers, bed mobility, locomotion level, and caring for others  PARTICIPATION LIMITATIONS: meal prep, cleaning, laundry, driving, shopping, and community activity  PERSONAL FACTORS: Fitness, Past/current experiences, Time since onset of injury/illness/exacerbation, and 3+ comorbidities: see PMH above are also affecting patient's functional outcome.    GOALS: Goals reviewed with patient? Yes  SHORT TERM GOALS: Target date: 09/26/2023  Patient will be I with initial HEP in order to progress with therapy. Baseline: None provided at eval 07/28/2023: patient reports independence with HEP Goal status: MET  2.  Patient will report low back and right leg pain </= 7/10 at worst to reduce functional limitations Baseline: 10/10 pain at worst 07/29/2023: reports pain at worst still 10/10 08/06/2023: continues to report pain 10/10 at worst 08/29/2023: continues to report pain 10/10 at worst 09/16/2023: continues to report pain 10/10 at worst Goal status: ONGOING  3.  Patient will be able to perform sit to stand without difficulty or using UE for support to improve mobility and indicate improvement in LE strength Baseline: requires UE for support with standing from chair 07/29/2023: able to perform sit to stand without UE support but exhibits difficulty  08/06/2023: able to perform sit to stand without UE support but exhibits difficulty due to knee pain 08/29/2023: patient can perform sit to stand without difficulty Goal status: MET  LONG TERM GOALS: Target date: 10/24/2023  Patient will be I with final HEP to maintain progress from PT. Baseline: None provided at eval 08/06/2023: progressing 08/29/2023: progressing Goal status:  ONGOING  2.  Patient will report Modified Oswestry </= 25/50 (50% disability) in order to indicate an improvement in their functional status Baseline: 37/50 (74% disability) 08/06/2023: not assessed 08/29/2023: 33/50 (66%) Goal status: ONGOING  3.  Patient will demonstrate improvement in core and hip strength grossly >/= 4-/5 MMT in order to improve her activity tolerance with household tasks Baseline: see limitations above 08/06/2023: see limitations above 08/29/2023: see limitations above Goal status: ONGOING  4.  Patient will demonstrate right knee strength >/= 4+/5 MMT in order to improve stair negotiation and reduce fall risk due to right knee giving out Baseline: see limitations above 08/06/2023: see limitations above 08/29/2023: see limitations above Goal status: ONGOING  5. Patient will report lower back and right leg pain </= 4/10 in order to reduce functional limitations  Baseline: 10/10 at worst  08/06/2023: continues to report pain 10/10 at worst 08/29/2023: continues to report pain 10/10 at worst 09/16/2023: continues to report pain 10/10 at worst  Goal status: ONGOING   PLAN: PT FREQUENCY: 1-2x/week  PT DURATION: 8 weeks  PLANNED INTERVENTIONS: 97164- PT Re-evaluation, 97110-Therapeutic exercises, 97530- Therapeutic activity, 97112- Neuromuscular re-education, 97535- Self Care, 16109- Manual therapy, [id number]- Electrical stimulation (unattended), 60454- Electrical stimulation (manual), Patient/Family education, Balance training, Taping, Dry Needling, Joint mobilization, Joint manipulation,  Spinal manipulation, Spinal mobilization, Cryotherapy, and Moist heat.  PLAN FOR NEXT SESSION: Review HEP and progress PRN, continue with modalities for pain relief as needed, manual/TPDN for right lumbar and gluteal region, LAD, gentle stretching and mobility for lumbar, gentle core activation, hip and LE strengthening as tolerated    Leah Primus, PT, DPT, LAT, ATC 09/18/23  10:55  AM Phone: [phone] Fax: [phone]

## 2023-09-23 ENCOUNTER — Inpatient Hospital Stay: Payer: Medicare Other | Attending: Adult Health

## 2023-09-23 ENCOUNTER — Ambulatory Visit (HOSPITAL_BASED_OUTPATIENT_CLINIC_OR_DEPARTMENT_OTHER): Admitting: Cardiovascular Disease

## 2023-09-23 ENCOUNTER — Encounter (HOSPITAL_BASED_OUTPATIENT_CLINIC_OR_DEPARTMENT_OTHER): Payer: Self-pay | Admitting: Cardiovascular Disease

## 2023-09-23 VITALS — BP 182/104 | HR 82 | Resp 16 | Ht 66.0 in | Wt 219.0 lb

## 2023-09-23 VITALS — BP 189/101 | HR 80 | Temp 98.6°F | Resp 18

## 2023-09-23 DIAGNOSIS — Z171 Estrogen receptor negative status [ER-]: Secondary | ICD-10-CM | POA: Diagnosis not present

## 2023-09-23 DIAGNOSIS — C50412 Malignant neoplasm of upper-outer quadrant of left female breast: Secondary | ICD-10-CM | POA: Insufficient documentation

## 2023-09-23 DIAGNOSIS — I1A0 Resistant hypertension: Secondary | ICD-10-CM | POA: Diagnosis not present

## 2023-09-23 DIAGNOSIS — Z1722 Progesterone receptor negative status: Secondary | ICD-10-CM | POA: Insufficient documentation

## 2023-09-23 DIAGNOSIS — Z5111 Encounter for antineoplastic chemotherapy: Secondary | ICD-10-CM | POA: Diagnosis present

## 2023-09-23 DIAGNOSIS — Z923 Personal history of irradiation: Secondary | ICD-10-CM | POA: Diagnosis not present

## 2023-09-23 DIAGNOSIS — Z1732 Human epidermal growth factor receptor 2 negative status: Secondary | ICD-10-CM | POA: Diagnosis not present

## 2023-09-23 MED ORDER — GOSERELIN ACETATE 3.6 MG ~~LOC~~ IMPL
3.6000 mg | DRUG_IMPLANT | SUBCUTANEOUS | Status: DC
  Administered 2023-09-23: 3.6 mg via SUBCUTANEOUS
  Filled 2023-09-23: qty 3.6

## 2023-09-23 NOTE — Patient Instructions (Signed)
 Medication Instructions:  Your physician recommends that you continue on your current medications as directed. Please refer to the Current Medication list given to you today.    Labwork: Your physician recommends that you return for lab work: LIPIDS and A1C   Follow-Up: Please follow up in 4 months in [facility] with Dr. Raford, Reche Finder, NP or Kristin Alvstad PharmD

## 2023-09-23 NOTE — Progress Notes (Signed)
 Cardiology Office Note:    Date:  09/23/2023   ID:  Erica [name], DOB April 05, 1975, MRN [id number]  PCP:  Knute Thersia Bitters, FNP  Cardiologist:  Annabella Scarce, MD  Nephrologist:  Referring MD: Knute Thersia Bitters, *   CC: Hypertension  History of Present Illness:    Erica [name] is a 48 y.o. female with a hx of resistant hypertension s/p renal denervation, medically managed CAD, chronic diastolic heart failure, breast cancer s/p chemo and XRT, morbid obesity and OSA on BiPAP, here for follow-up. She initially established care in the hypertension clinic 04/10/2020. She reports that her BP has been elevated since she was treated for breast cancer in 2010.  Since then her blood pressure has never been very well have been controlled.  She has been on multiple antihypertensives and her blood pressure remains in the 160s to 170s systolic.  She struggles with exertional dyspnea that makes it hard for her to walk even short distances.  Echo 03/2018 revealed LVEF 45 to 50% with grade 2 diastolic dysfunction.  There was mild MR.  PASP was 33.  Erica [name] has a history of recurrent syncope.  She had an episode that occurred while wearing an ambulatory monitor and the episode was felt to be due to narcolepsy.  She has sleep apnea but is not tolerant of her CPAP machine.  She now uses her BIPAP machine but struggles with insomnia.  She wakes up hourly.  She underwent left and right heart catheterization on 05/04/2019.  She was found to have 60% distal left circumflex and 80% distal LAD disease.  Given her aspirin  allergy  Dr. Maranda recommended that she start clopidogrel .  Right atrial pressure was 10, and PA pressure was 26/10.  PCWP 18.  LVEDP was 23 mmHg.  At her first hypertension clinic visit there were concerns about whether she was taking her medication as prescribed.  Discrepancies between what was ordered and what she was actually picking up from the pharmacy.  She continues to report taking all  medications as prescribed.  Her blood pressures continue to run quite elevated.  At her initial assessment doxazosin  was added to her regimen.  This does not seem to have helped much.  She followed up with our pharmacist and was started on amlodipine .  She was unable to exercise due to poorly controlled blood pressure. Renal artery Dopplers revealed 1- 59% stenosis bilaterally in 2019.  CT-A of the abdomen showed a 1.3 cm renal cyst but no adrenal adenomas.  There was no evidence of renal artery stenosis.  Erica [name] saw our pharmacist on 12/2019.  She was in a lot of pain at the time.  Given her high number or medications and her pain, her medications were not changed.  She has struggled with LE edema.  LE Doppler was negative for DVT 10/2019.  She was given metolazone  to take prior to torsemide .  For the last two months she has been more swollen but it is finally better.  Her weight has decreased from 211 lb to 187 lb.  Erica [name] continues to struggle with shortness of breath. Her edema resolved but there was no improvement in her breathing.  She uses her CPAP every other night.  She notes that there is a recall on her CPAP and she isn't supposed to be using it.  However, she can't sleep well without it. Lately her BP at home has been in the 150-200s.  She notes that when she has pain  it is higher.  She has a lot of joint pain, especially in her feel.    Erica [name] wanted to reduce her medications.  However her blood pressure was significantly elevated.  We reduced hydralazine  to 50 mg 3 times a day and increase doxazosin  to 8 mg twice daily in an effort to try and get her off of her 3 times daily medications and improve compliance.  Ranexa  was increased due to chest pain.  This seems to have helped her chest pain.  It seems to occur randomly both at rest and with exertion.  Her clonidine  was increased to 0.6 mg every 24 hours. She wore an  ambulatory monitor that showed PVCs and up to 8 beats of SVT.  The  episodes seem to occur mostly in the early a.m.  She [facility]. She continued to have exertional intolerance and chest pain.  She underwent left heart catheterization 09/2020 and her cath revealed 80% distal LAD and 30% LPA B with 50% PL 1 stenoses.  Overall unchanged from prior.  Her blood pressure remained very poorly controlled, initially in the low 200s over 110s.  It did improve to 152/78 with hydralazine  and verapamil  IV.  It was noted that she was extremely anxious and that it was also contributing.  Her angina was thought to be due to microvascular disease and poorly controlled blood pressure.  Her distal LAD lesion was not thought to be favorable for PCI but if her chest pain were intractable could be considered.  Erica [name] was struggling with anxiety and referred to psychiatry. Doxazosin  was increased.  Diltiazem  was increased to 300 mg daily due to palpitations.  Renal artery Dopplers revealed minimal stenosis.  She underwent renal artery denervation 10/2022.  Initial blood pressure after was 140s to 150s.   Discussed the use of AI scribe software for clinical note transcription with the patient, who gave verbal consent to proceed.  History of Present Illness Erica [name] experiences persistent back pain that began after a procedure, described as better than before but still bothersome, especially when not lying on her stomach. She has been undergoing therapy, including dry needling, which has provided some relief. There is degeneration in the lower lumbar region. Additional symptoms include burning and itching in her back. She avoids lifting or pulling activities to prevent exacerbating her back pain.  She also experiences knee pain, described as 'biting strikes from the sky to the ground,' lasting up to two days. Dry needling has helped alleviate some of the tightness in her kneecap and tendons. A cortisone shot was administered but did not provide relief.  Her  blood pressure remains elevated, with readings around 160/100 mmHg at home and 182/unknown during the visit. She is on multiple antihypertensive medications, including amlodipine , chlorthalidone , clonidine , diltiazem , and doxazosin . Despite medication adjustments, her blood pressure has not significantly improved. She is also taking torsemide  and metolazone , which she dislikes due to side effects such as frequent urination and feeling unwell for a day or two after taking them. These medications help keep the fluid off but cause her to feel 'horrible.'  She has a history of prediabetes and is awaiting lab results to check her A1c levels. Her last A1c in March was 6.0%.  She mentions a recent fall that resulted in a bruise, which did not help her condition.    Past Medical History:  Diagnosis Date   Allergy     Anxiety    Apnea, sleep  05/04/2014   AHI 31/hr now on CPAP at 12cm H2O   Arthritis    Asthma    Asthma without status asthmaticus 01/19/2016   Axillary hidradenitis suppurativa 08/01/2011   Blood transfusion without reported diagnosis    Breast cancer (HCC) 2010   a. locally advanced left breast carcinoma diagnosed in 2010 and treated with neoadjuvant chemotherapy with docetaxel and cyclophosphamide as well as paclitaxel. This was followed by radiation therapy which was completed in 2011.   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  -Med calendar 12/07/12 , 04/06/2013 , 04/18/2014,,  08/30/2014 , 11/08/2014  - PFT's 01/12/2013  1.73 (65%)  Ratio 73 and no better p B2, DLCO 86% - trial off    Depression with somatization 06/15/2015   Dyspnea on exertion    a. 01/2013 Lexi MV: EF 54%, no ischemia/infarct;  b. 05/2013 Echo: EF 60-65%, no rwma, Gr 2 DD;  b.    Essential hypertension 10/09/2014   Estimated Creatinine Clearance: 82.4 mL/min (by C-G formula based on SCr of 0.98 mg/dL).   Fatty liver    Gastroparesis    GERD (gastroesophageal reflux disease)    Hyperglycemia 04/13/2011   Hypertension    Hypertensive heart disease 09/26/2015   Hypertriglyceridemia without hypercholesterolemia 06/15/2015   Framingham risk score is 1%    Idiopathic intracranial hypertension 10/21/2014   Impaired glucose tolerance 04/13/2011   Insomnia secondary to anxiety 04/05/2015   Lymphadenitis, chronic    restricted LEFT extremity   Malignant neoplasm of upper-outer quadrant of left breast in female, estrogen receptor negative (HCC) 01/07/2013   Stage III, Invasive Ductal, s/p partial Left Mastectomy/Lumpectomy (baseball sized) with Lymph Node dissection, had Chemo and RadRx     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   OSA (obstructive sleep apnea) 05/04/2014   AHI 31/hr now on CPAP at 12cm H2O   Oxygen deficiency    Uses 02 in CPAP   Persistent proteinuria 03/31/2018   Personal history of chemotherapy 2010   Personal history of radiation therapy 2010   Resistant hypertension 09/17/2011       Sleep apnea    SVT (supraventricular tachycardia) (HCC) 08/22/2020   Tibialis posterior tendinitis, left 07/15/2018   Injected in March 10, 2019   Ulcer    Upper airway cough syndrome 10/09/2014   Onset around 2012 with assoc pseudowheeze MRI 05/18/15 > Paranasal sinuses are clear - 11/17/2015 rec increase h1 to 2 hs to help with noct cough  - Allergy  profile 11/17/2015 >  Eos 0.1 /  IgE  80 POS RAST  to grass, ragweed, cedar tree  - d/c acei 03/06/2018  - flare end of Jan 2020 in setting of ? Uri/  poorly controlled GERD - flare mid Oct 2020 with uri / assoc secondary gerd   Vitamin D  deficiency disease 03/31/2018    Past Surgical History:  Procedure Laterality Date   BRAIN SURGERY     [facility]   BREAST LUMPECTOMY  Left 09/2008   CARPAL TUNNEL RELEASE Left 2011   LYMPH NODE DISSECTION Left 2010   breast; 2 wks after breast lumpectomy   RENAL DENERVATION N/A 11/20/2022   Procedure: RENAL DENERVATION;  Surgeon: Darron Deatrice LABOR, MD;  Location: [facility] INVASIVE CV LAB;  Service: Cardiovascular;  Laterality: N/A;   RIGHT/LEFT HEART CATH AND CORONARY ANGIOGRAPHY N/A 05/04/2019   Procedure: RIGHT/LEFT HEART CATH AND CORONARY ANGIOGRAPHY;  Surgeon: Cherrie Toribio SAUNDERS, MD;  Location: [facility] INVASIVE CV LAB;  Service: Cardiovascular;  Laterality: N/A;  Renal injection preformed    RIGHT/LEFT HEART CATH AND CORONARY ANGIOGRAPHY N/A 10/25/2020   Procedure: RIGHT/LEFT HEART CATH AND CORONARY ANGIOGRAPHY;  Surgeon: Anner Alm ORN, MD;  Location[facility] INVASIVE CV LAB;  Service: Cardiovascular;  Laterality: N/A;   TENDON RELEASE Right 2012   hand   TUBAL LIGATION  05/11/2011   Procedure: POST PARTUM TUBAL LIGATION;  Surgeon: Lynwood Solomons, MD;  Location: [facility] OR[facility];  Service: Gynecology;  Laterality: Bilateral;  Induced for HTN    Current Medications: Current Meds  Medication Sig   acetaZOLAMIDE  (DIAMOX ) 250 MG tablet Take 4 tablets (1,000 mg total) by mouth 3 (three) times daily.   albuterol  (PROVENTIL ) (2.5 MG/3ML) 0.083% nebulizer solution USE 1 VIAL VIA NEBULIZER EVERY 4 HOURS AND AS NEEDED FOR WHEEZING OR SHORTNESS OF BREATH   albuterol  (VENTOLIN  HFA) 108 (90 Base) MCG/ACT inhaler TAKE 2 PUFFS BY MOUTH EVERY 6 HOURS AS NEEDED FOR WHEEZE OR SHORTNESS OF BREATH   AMBULATORY NON FORMULARY MEDICATION Carpal Tunnel Wrist brace. Rt  Carpal Tunnel Syndrome G56.01  Disp 1   AMBULATORY NON FORMULARY MEDICATION Lace up ankle brace (ASO type)  Ankle sprain S93.491A  Disp 1   amitriptyline  (ELAVIL ) 100 MG tablet  Take 2 tablets (200 mg total) by mouth at bedtime.   amLODipine  (NORVASC ) 10 MG tablet TAKE 1 TABLET BY MOUTH EVERY DAY   benzonatate  (TESSALON ) 100 MG capsule Take 1 capsule (100 mg total) by mouth 3 (three) times daily as needed.   budesonide -formoterol  (SYMBICORT ) 160-4.5 MCG/ACT inhaler INHALE 2 PUFFS INTO LUNGS 2 TIMES DAILY AS DIRECTED   cetirizine  (ZYRTEC  ALLERGY ) 10 MG tablet Take 1 tablet (10 mg total) by mouth daily as needed for allergies.   chlorthalidone  (HYGROTON ) 25 MG tablet TAKE 1 TABLET (25 MG TOTAL) BY MOUTH DAILY.   Cholecalciferol  (VITAMIN D ) 50 MCG (2000 UT) tablet Take 2,000 Units by mouth daily.   cloNIDine  (CATAPRES  - DOSED IN MG/24 HR) 0.3 mg/24hr patch PLACE 2 PATCHES ONTO SKIN ONCE WEEKLY   clopidogrel  (PLAVIX ) 75 MG tablet TAKE 1 TABLET BY MOUTH EVERY DAY   clotrimazole  (MYCELEX ) 10 MG troche TAKE 1 TABLET BY MOUTH 5 TIMES DAILY.   dexlansoprazole  (DEXILANT ) 60 MG capsule Take 1 capsule (60 mg total) by mouth daily.   dextromethorphan-guaiFENesin  (MUCINEX  DM) 30-600 MG 12hr tablet Take 2 tablets by mouth 2 (two) times daily.   diltiazem  (CARDIZEM  CD) 360 MG 24 hr capsule Take 1 capsule (360 mg total) by mouth daily.   doxazosin  (CARDURA ) 8 MG tablet TAKE 2 TABLETS BY MOUTH AT BEDTIME   DULoxetine  (CYMBALTA ) 30 MG capsule TAKE 1 CAPSULE (30 MG TOTAL) BY MOUTH DAILY. WITH 60MG  FOR TOTAL OF 90 MG (Patient taking differently: Take 30 mg by mouth See admin instructions. With 60mg  for total of 90 mg)   DULoxetine  (CYMBALTA ) 60 MG capsule TAKE 1 CAPSULE (60 MG TOTAL) BY MOUTH DAILY. FOR TOTAL OF 90 MG (Patient taking differently: Take 60 mg by mouth  See admin instructions. Take with 30 mg for a total of 90 mg in the morning)   ENTRESTO  97-103 MG TAKE 1 TABLET BY MOUTH TWICE A DAY   EPINEPHRINE  0.3 mg/0.3 mL IJ SOAJ injection INJECT 0.3 MLS INTO THE MUSCLE ONCE AS NEEDED FOR ANAPHYLAXIS   Evolocumab  (REPATHA  SURECLICK) 140 MG/ML SOAJ INJECT 140 MG INTO THE SKIN EVERY 14  (FOURTEEN) DAYS.   ezetimibe  (ZETIA ) 10 MG tablet Take 10 mg by mouth daily.   famotidine  (PEPCID ) 20 MG tablet Take 1-2 tablets (20-40 mg total) by mouth at bedtime. (Patient taking differently: Take 20 mg by mouth 4 (four) times daily -  before meals and at bedtime.)   gabapentin  (NEURONTIN ) 300 MG capsule TAKE 1 CAPSULE BY MOUTH 3 TIMES DAILY AS NEEDED.   hydrALAZINE  (APRESOLINE ) 100 MG tablet Take 1 tablet (100 mg total) by mouth 3 (three) times daily.   lidocaine -prilocaine  (EMLA ) cream Apply 1 application topically every 28 (twenty-eight) days. Apply 1 application to skin ever 28 days   metoCLOPramide  (REGLAN ) 10 MG tablet TAKE 1 TABLET BY MOUTH 4 TIMES DAILY.   metolazone  (ZAROXOLYN ) 2.5 MG tablet TAKE 1 TABLET BY MOUTH ONE TIME PER WEEK   minoxidil  (LONITEN ) 2.5 MG tablet TAKE 2 TABLETS (5 MG TOTAL) BY MOUTH 2 (TWO) TIMES DAILY   montelukast  (SINGULAIR ) 10 MG tablet TAKE 1 TABLET BY MOUTH EVERYDAY AT BEDTIME   Nebivolol  HCl 20 MG TABS TAKE 2 TABLETS BY MOUTH EVERY DAY   nitroGLYCERIN  (NITROSTAT ) 0.4 MG SL tablet PLACE 1 TABLET UNDER THE TONGUE EVERY 5 MINUTES AS NEEDED FOR CHEST PAIN.   oxymetazoline  (MUCINEX  SINUS-MAX FULL FORCE) 0.05 % nasal spray Place 1 spray into both nostrils 2 (two) times daily.   Potassium Chloride  ER 20 MEQ TBCR Take 2 tablets (40 mEq total) by mouth 2 (two) times daily.   primidone  (MYSOLINE ) 50 MG tablet Take 0.5 tablets (25 mg total) by mouth at bedtime.   promethazine  (PHENERGAN ) 25 MG tablet TAKE 1 TABLET (25 MG TOTAL) BY MOUTH 3 (THREE) TIMES DAILY AS NEEDED FOR NAUSEA OR VOMITING.   promethazine -dextromethorphan (PROMETHAZINE -DM) 6.25-15 MG/5ML syrup Take 5 mLs by mouth 4 (four) times daily as needed for cough.   ranolazine  (RANEXA ) 1000 MG SR tablet Take 1 tablet (1,000 mg total) by mouth 2 (two) times daily.   Respiratory Therapy Supplies (FLUTTER) DEVI Use as directed   rosuvastatin  (CRESTOR ) 40 MG tablet TAKE 1 TABLET BY MOUTH EVERY DAY    Spacer/Aero-Holding Chambers (AEROCHAMBER MV) inhaler Use as instructed   spironolactone  (ALDACTONE ) 100 MG tablet TAKE 1 TABLET BY MOUTH EVERY DAY   torsemide  (DEMADEX ) 20 MG tablet Take 2 Tablets by mouth every Morning and 1 Tablet by mouth every Evening     Allergies:   Aspirin , Bactrim [sulfamethoxazole-trimethoprim], Doxycycline , Kiwi extract, Metoprolol , Penicillins, and Strawberry extract   Social History   Socioeconomic History   Marital status: Single    Spouse name: Not on file   Number of children: 3   Years of education: Not on file   Highest education level: Bachelor's degree (e.g., BA, AB, BS)  Occupational History    Employer: UNEMPLOYED  Tobacco Use   Smoking status: Never   Smokeless tobacco: Never  Vaping Use   Vaping status: Never Used  Substance and Sexual Activity   Alcohol use: Yes    Comment: occasionally/socially   Drug use: No   Sexual activity: Yes    Birth control/protection: Surgical, None  Other Topics Concern  Not on file  Social History Narrative   She lives with four children.   She is currently not working (disbaility pending).   Left handed   One story home   Drinks caffeine      Social Drivers of Health   Financial Resource Strain: High Risk (06/12/2023)   Overall Financial Resource Strain (CARDIA)    Difficulty of Paying Living Expenses: Very hard  Food Insecurity: Food Insecurity Present (06/12/2023)   Hunger Vital Sign    Worried About Running Out of Food in the Last Year: Sometimes true    Ran Out of Food in the Last Year: Sometimes true  Transportation Needs: Patient Declined (06/12/2023)   PRAPARE - Administrator, Civil Service (Medical): Patient declined    Lack of Transportation (Non-Medical): Patient declined  Physical Activity: Unknown (06/12/2023)   Exercise Vital Sign    Days of Exercise per Week: Patient declined    Minutes of Exercise per Session: 0 min  Stress: Stress Concern Present (06/12/2023)    [employer] of Occupational Health - Occupational Stress Questionnaire    Feeling of Stress : Rather much  Social Connections: Unknown (06/12/2023)   Social Connection and Isolation Panel    Frequency of Communication with Friends and Family: Patient declined    Frequency of Social Gatherings with Friends and Family: Patient declined    Attends Religious Services: Patient declined    Database administrator or Organizations: Patient declined    Attends Engineer, structural: Not on file    Marital Status: Never married    Family History: The patient's family history includes Breast cancer in some other family members; CAD in her maternal grandmother; Diabetes in her maternal grandmother and mother; Fibroids (age of onset: 42) in her sister; Heart disease in her maternal grandmother; Hypertension in her maternal grandmother; Other (age of onset: 40) in her mother. There is no history of Cancer, Alcohol abuse, Early death, Hyperlipidemia, Kidney disease, Stroke, Colon cancer, Esophageal cancer, Stomach cancer, Rectal cancer, or Colon polyps.  ROS:   Please see the history of present illness.    (+) Right shoulder pain (+) Shortness of breath (+) Bilateral LE edema All other systems reviewed and are negative.  EKGs/Labs/Other Studies Reviewed:    Echo 04/05/2021: Sonographer Comments: Patient is morbidly obese. Image acquisition  challenging due to patient body habitus.  IMPRESSIONS    1. Left ventricular ejection fraction, by estimation, is 55 to 60%. The  left ventricle has normal function. The left ventricle has no regional  wall motion abnormalities. There is mild left ventricular hypertrophy.  Left ventricular diastolic parameters  were normal. The average left ventricular global longitudinal strain is  -15.0 %. The global longitudinal strain is abnormal.   2. Right ventricular systolic function is normal. The right ventricular  size is mildly enlarged. There is mildly  elevated pulmonary artery  systolic pressure. The estimated right ventricular systolic pressure is  40.2 mmHg.   3. Left atrial size was moderately dilated.   4. Right atrial size was moderately dilated.   5. The mitral valve is normal in structure. Mild mitral valve  regurgitation. No evidence of mitral stenosis.   6. The aortic valve is normal in structure. Aortic valve regurgitation is  not visualized. No aortic stenosis is present.   7. The inferior vena cava is normal in size with greater than 50%  respiratory variability, suggesting right atrial pressure of 3 mmHg.   Comparison(s): EF 45%, moderate  LVH, GLS -13.1%.   Right/Left Heart Cath 10/25/2020:   Dist LAD lesion is 80% stenosed.   LPAV lesion is 30% stenosed with 50% stenosed side branch in 1st LPL.   There is hyperdynamic left ventricular systolic function.  The left ventricular ejection fraction is greater than 65% by visual estimate.   Hemodynamic findings consistent with mild (secondary) pulmonary hypertension - LVEDP ~25 mmHg, PCWP ~19-20 mmHg   Normal Cardiac Output / Index (7.91 / 3.94)   POST-OPERATIVE DIAGNOSIS:              Stable single-vessel CAD with apical LAD 80% stenosis-no change from previous cath.  Otherwise very tortuous coronary arteries with no significant stenoses.            Mild (likely secondary) Pulmonary Hypertension with PA mean pressure of 28 mmHg (PAP 39/15 mmHg), and PCWP of 19 mmHg with LVEDP of 25 mmHg.            Significant systolic hypertension with initial pressures in the 200s over 110s, however after medication 152/78 mmHg with MAP of 109 mmHg; LVP/EDP 155/13 mmHg - 25 mmHg.            Ao sat 99%, PA sat 81%.  Fick Cardiac Output-Index: 7.91-3.84 (normal)            Suspect angina is due to accelerated hypertension, increased wall stress and microvascular ischemia.  Distal LAD lesion is not favorable for PCI (however if pain is intractable, could be  considered)  Diagnostic Dominance: Right    Monitor 03/23/2020: 3 Day Zio Monitor   Quality: Fair.  Baseline artifact. Predominant rhythm: sinus Average heart rate: 84 bpm Max heart rate: 140 bpm Min heart rate: 57 bpm Pauses >2.5 seconds: none Occasional PVCs and ventricular bigeminy Two runs of SVT up to 8 beats.  Rate 224 bpm.    LHC/RHC 05/04/19: Dist LAD-1 lesion is 30% stenosed. Dist LAD-2 lesion is 80% stenosed. Dist Cx lesion is 60% stenosed.   Findings:   Ao = 173/104 (134) LV =  181/25 RA =  10 RV =  41/13 PA =  42/10 (26) PCW = 18 Fick cardiac output/index = 5.8/2.9 PVR = 1.4 wu Ao sat = 98% PA sat = 73%, 73%   Assessment: 1. 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 LVEF 55-60% 3. Mildly elevated filling pressures with mild pulmonary venous HTN m4. Widely patent bilateral renal arteries. No evidence of AAA 5. Severe systemic HTN   Renal artery Doppler 10/29/18: Right: 1-59% stenosis of the right renal artery. Cyst(s) noted.  Left:  1-59% stenosis of the left renal artery.    Lexiscan  Myoview  04/20/18: The left ventricular ejection fraction is moderately decreased (30-44%). Nuclear stress EF: 41%. There was no ST segment deviation noted during stress. This is an intermediate risk study.   Abnormal, intermediate risk stress nuclear study with no ischemia or infarction; EF 41 with global hypokinesis and mild LVE; study intermediate risk due to reduced LV function.  EKG:   04/23/19: sinus tachycardia.  Rate 124 bpm.  LVH  EKG Interpretation Date/Time:  Tuesday September 23 2023 11:34:43 EDT Ventricular Rate:  89 PR Interval:  148 QRS Duration:  88 QT Interval:  376 QTC Calculation: 457 R Axis:   -7  Text Interpretation: Normal sinus rhythm Possible Left atrial enlargement Minimal voltage criteria for LVH, may be normal variant ( Cornell product ) Left anterior fasicular block No significant change since last tracing Confirmed by Raford Riggs ([id number])  on 09/23/2023 12:57:03 PM         Recent Labs: 10/02/2022: Magnesium  2.3; TSH 1.790 12/25/2022: B Natriuretic Peptide 24.1 05/06/2023: ALT 31; BUN 14; Creatinine 1.07; Hemoglobin 14.3; Platelet Count 190; Potassium 4.1; Sodium 140   Recent Lipid Panel    Component Value Date/Time   CHOL 90 (L) 10/02/2022 0949   TRIG 92 10/02/2022 0949   HDL 46 10/02/2022 0949   CHOLHDL 2.0 10/02/2022 0949   CHOLHDL 4 10/01/2018 1115   VLDL 22.6 10/01/2018 1115   LDLCALC 26 10/02/2022 0949   LDLDIRECT 115 (H) 09/17/2021 1001   LDLDIRECT 152.0 03/30/2018 1604    Physical Exam:    Wt Readings from Last 3 Encounters:  09/23/23 219 lb (99.3 kg)  07/14/23 218 lb (98.9 kg)  07/01/23 219 lb (99.3 kg)    VS:  BP (!) 182/104   Pulse 82   Resp 16   Ht 5' 6 (1.676 m)   Wt 219 lb (99.3 kg)   LMP  (LMP Unknown)   SpO2 99%   BMI 35.35 kg/m  , BMI Body mass index is 35.35 kg/m. GENERAL:  Well appearing HEENT: Pupils equal round and reactive, fundi not visualized, oral mucosa unremarkable NECK:  No jugular venous distention, waveform within normal limits, carotid upstroke brisk and symmetric, no bruits LUNGS:  Clear to auscultation bilaterally HEART:  RRR.  PMI not displaced or sustained,S1 and S2 within normal limits, no S3, no S4, no clicks, no rubs, no murmurs ABD:  Non-tender.  positive bowel sounds normal in frequency in pitch, no bruits, no rebound, no guarding, no midline pulsatile mass, no hepatomegaly, no splenomegaly EXT:  2 plus pulses throughout, no edema , no cyanosis no clubbing SKIN:  No rashes no nodules; Blistering lesion on left thigh. NEURO:  Cranial nerves II through XII grossly intact, motor grossly intact throughout PSYCH:  Cognitively intact, oriented to person place and time   ASSESSMENT:    1. Resistant hypertension     PLAN:    Assessment & Plan # Resistant Hypertension Erica [name] continues to have very resistant hypertension.  Her hypertension remains elevated  despite medication adjustments. Non-pharmacological interventions emphasized due to concerns about medication side effects.  I did think that adding an additional medication will be helpful at this point.  Blood pressure did improve initially after renal denervation but now seems to be more elevated now, likely her back pain is contributing.  Consider for Kardia study.   - Emphasize non-pharmacological interventions such as weight loss and exercise. - Continue amlodipine , chlorthalidone , clonidine , diltiazem , doxazosin , Entresto , hydralazine , minoxidil , Nebivolol , and spironolactone .  # HFpE volume status is stable.  Continue spironolactone , Entresto , torsemide , once weekly metolazone , and blood pressure management as above.  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 Therapy and dry needling provide relief. Aim to alleviate pain to facilitate increased physical activity. - Continue therapy for back pain management. - Avoid lifting or pulling activities.  # Knee pain with possible nerve involvement Knee pain with possible nerve involvement, partially relieved by dry needling. Focus on managing nerve-related pain to improve mobility. - Continue dry needling for knee pain management.  # Prediabetes Prediabetes with previous A1c of 6.0%. Awaiting lab results. Discussed potential use of Ozempic pending authorization. Emphasized dietary modifications. - Order lab work to assess A1c and lipid levels. - Send message to pharmacist regarding Ozempic authorization.   HYPERTENSION CONTROL Vitals:   09/23/23 1141 09/23/23 1207  BP: ROLLEN)  181/123 (!) 182/104    The patient's blood pressure is elevated above target today.  In order to address the patient's elevated BP:     Disposition: FU with APP in 4 months.  Medication Adjustments/Labs and Tests Ordered: Current medicines are reviewed at length with the patient today.  Concerns regarding  medicines are outlined above.   Orders Placed This Encounter  Procedures   Lipid panel   Comprehensive metabolic panel with GFR   EKG 87-Ozji   No orders of the defined types were placed in this encounter.   Signed, Annabella Scarce, MD  09/23/2023 1:13 PM    [facility]

## 2023-09-25 ENCOUNTER — Encounter: Payer: Self-pay | Admitting: Physical Therapy

## 2023-09-25 ENCOUNTER — Other Ambulatory Visit: Payer: Self-pay

## 2023-09-25 ENCOUNTER — Ambulatory Visit (INDEPENDENT_AMBULATORY_CARE_PROVIDER_SITE_OTHER): Admitting: Physical Therapy

## 2023-09-25 DIAGNOSIS — M5459 Other low back pain: Secondary | ICD-10-CM | POA: Diagnosis not present

## 2023-09-25 DIAGNOSIS — M79604 Pain in right leg: Secondary | ICD-10-CM

## 2023-09-25 DIAGNOSIS — M6281 Muscle weakness (generalized): Secondary | ICD-10-CM

## 2023-09-25 NOTE — Therapy (Signed)
 OUTPATIENT PHYSICAL THERAPY TREATMENT    Patient Name: Erica [name] MRN: [id number] DOB:1975/11/13, 48 y.o., female Today's Date: 09/25/2023   END OF SESSION:  PT End of Session - 09/25/23 1017     Visit Number 22    Number of Visits 34    Date for PT Re-Evaluation 10/24/23    Authorization Type UHC MCR    Progress Note Due on Visit 30    PT Start Time 1016    PT Stop Time 1058    PT Time Calculation (min) 42 min    Activity Tolerance Patient tolerated treatment well    Behavior During Therapy WFL for tasks assessed/performed                               Past Medical History:  Diagnosis Date   Allergy     Anxiety    Apnea, sleep 05/04/2014   AHI 31/hr now on CPAP at 12cm H2O   Arthritis    Asthma    Asthma without status asthmaticus 01/19/2016   Axillary hidradenitis suppurativa 08/01/2011   Blood transfusion without reported diagnosis    Breast cancer (HCC) 2010   a. locally advanced left breast carcinoma diagnosed in 2010 and treated with neoadjuvant chemotherapy with docetaxel and cyclophosphamide as well as paclitaxel. This was followed by radiation therapy which was completed in 2011.   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  -Med calendar 12/07/12 , 04/06/2013 , 04/18/2014,, 08/30/2014 , 11/08/2014  - PFT's 01/12/2013  1.73 (65%)  Ratio 73 and no better p B2, DLCO 86% - trial off    Depression with somatization 06/15/2015   Dyspnea on  exertion    a. 01/2013 Lexi MV: EF 54%, no ischemia/infarct;  b. 05/2013 Echo: EF 60-65%, no rwma, Gr 2 DD;  b.    Essential hypertension 10/09/2014   Estimated Creatinine Clearance: 82.4 mL/min (by C-G formula based on SCr of 0.98 mg/dL).   Fatty liver    Gastroparesis    GERD (gastroesophageal reflux disease)    Hyperglycemia 04/13/2011   Hypertension    Hypertensive heart disease 09/26/2015   Hypertriglyceridemia without hypercholesterolemia 06/15/2015   Framingham risk score is 1%    Idiopathic intracranial hypertension 10/21/2014   Impaired glucose tolerance 04/13/2011   Insomnia secondary to anxiety 04/05/2015   Lymphadenitis, chronic    restricted LEFT extremity   Malignant neoplasm of upper-outer quadrant of left breast in female, estrogen receptor negative (HCC) 01/07/2013   Stage III, Invasive Ductal, s/p partial Left Mastectomy/Lumpectomy (baseball sized) with Lymph Node dissection, had Chemo and RadRx     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   OSA (obstructive sleep apnea) 05/04/2014   AHI 31/hr now on CPAP at 12cm H2O   Oxygen deficiency    Uses 02 in CPAP   Persistent proteinuria 03/31/2018   Personal history of chemotherapy 2010   Personal history  of radiation therapy 2010   Resistant hypertension 09/17/2011       Sleep apnea    SVT (supraventricular tachycardia) (HCC) 08/22/2020   Tibialis posterior tendinitis, left 07/15/2018   Injected in March 10, 2019   Ulcer    Upper airway cough syndrome 10/09/2014   Onset around 2012 with assoc pseudowheeze MRI 05/18/15 > Paranasal sinuses are clear - 11/17/2015 rec increase h1 to 2 hs to help with noct cough  - Allergy  profile 11/17/2015 >  Eos 0.1 /  IgE  80 POS RAST  to grass, ragweed, cedar tree  - d/c acei 03/06/2018  - flare end of Jan 2020 in setting of ? Uri/ poorly controlled GERD - flare mid Oct 2020 with uri / assoc secondary gerd   Vitamin D  deficiency disease 03/31/2018   Past Surgical History:  Procedure  Laterality Date   BRAIN SURGERY     Shunt   BREAST LUMPECTOMY Left 09/2008   CARPAL TUNNEL RELEASE Left 2011   LYMPH NODE DISSECTION Left 2010   breast; 2 wks after breast lumpectomy   RENAL DENERVATION N/A 11/20/2022   Procedure: RENAL DENERVATION;  Surgeon: Darron Deatrice LABOR, MD;  Location: [facility] INVASIVE CV LAB;  Service: Cardiovascular;  Laterality: N/A;   RIGHT/LEFT HEART CATH AND CORONARY ANGIOGRAPHY N/A 05/04/2019   Procedure: RIGHT/LEFT HEART CATH AND CORONARY ANGIOGRAPHY;  Surgeon: Cherrie Toribio SAUNDERS, MD;  Location: [facility] INVASIVE CV LAB;  Service: Cardiovascular;  Laterality: N/A;  Renal injection preformed    RIGHT/LEFT HEART CATH AND CORONARY ANGIOGRAPHY N/A 10/25/2020   Procedure: RIGHT/LEFT HEART CATH AND CORONARY ANGIOGRAPHY;  Surgeon: Anner Alm ORN, MD;  Location: [facility] INVASIVE CV LAB;  Service: Cardiovascular;  Laterality: N/A;   TENDON RELEASE Right 2012   hand   TUBAL LIGATION  05/11/2011   Procedure: POST PARTUM TUBAL LIGATION;  Surgeon: Lynwood Solomons, MD;  Location: [facility];  Service: Gynecology;  Laterality: Bilateral;  Induced for HTN   Patient Active Problem List   Diagnosis Date Noted   Prediabetes 06/13/2023   Moderate episode of recurrent major depressive disorder (HCC) 02/04/2023   GAD (generalized anxiety disorder) 02/04/2023   Chronic respiratory failure with hypoxia (HCC) 12/31/2021   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   Coronary artery disease    Chronic stable angina (HCC) 08/22/2020   SVT (supraventricular tachycardia) (HCC) 08/22/2020   Palpitations 02/28/2020   Right ankle pain 04/14/2019   OSA (obstructive sleep apnea) 10/01/2018   Tibialis posterior tendinitis, left 07/15/2018   Chronic pain in left foot 06/29/2018   Vitamin D  deficiency disease 03/31/2018   Persistent proteinuria 03/31/2018   Hyperlipidemia LDL goal <100 03/30/2018   Chemotherapy-induced peripheral neuropathy (HCC) 12/09/2016   Asthma in adult, mild persistent, uncomplicated 01/19/2016    Hypertensive heart disease 09/26/2015   Depression with somatization 06/15/2015   Hypertriglyceridemia without hypercholesterolemia 06/15/2015   Hypokalemia 06/15/2015   Insomnia secondary to anxiety 04/05/2015   Idiopathic intracranial hypertension 10/21/2014   Upper airway cough syndrome 10/09/2014   Malignant hypertension 10/09/2014   Morbid obesity due to excess calories (HCC) 10/09/2014   Apnea, sleep 05/04/2014   Acute on chronic diastolic CHF (congestive heart failure), NYHA class 2 (HCC) 05/03/2014   DOE (dyspnea on exertion) 07/14/2013   Acute on chronic diastolic heart failure (HCC) 06/23/2013   Malignant neoplasm of upper-outer quadrant of left breast in female, estrogen receptor negative (HCC) 01/07/2013   Gastroparesis 01/04/2013   Resistant hypertension 09/17/2011   Axillary hidradenitis suppurativa 08/01/2011   Preventative health  care 04/13/2011   Hyperglycemia 04/13/2011   GERD (gastroesophageal reflux disease) 03/18/2011   Cough variant asthma 09/27/2010    PCP: Knute Thersia Bitters, FNP  REFERRING PROVIDER: Joane Artist RAMAN, MD  REFERRING DIAG: Lumbar radiculopathy  Rationale for Evaluation and Treatment: Rehabilitation  THERAPY DIAG:  Other low back pain  Pain in right leg  Muscle weakness (generalized)  ONSET DATE: August 2024   SUBJECTIVE:     SUBJECTIVE STATEMENT: Patient reports she is doing alright. She saw the cardiologist for persistent hypertension and they think her pain level can be contributing to her blood pressure issues so she should continue to work on her back and knee pain, and will try to work on blood pressure naturally since medication has not worked.  Eval: Patients reports she had renal denervation done in August 2024 and has been in pain ever since. Pain is located across the lower back, she was told it would hurt for 3-4 months but pain persisted in the lower back. The right knee pain started after in October or November, and it  will keep her up at night. She states it feels heavy in her lower back, like labor pain that never stops. The pain will start in the right lower back region and then will feel like lightning shooting down the right leg to the back and outside of the right knee, pain does not go past the knee. She leans on the bed for relief, and she states getting on her stomach gives her the most relief, but she has a lot of pain when getting up. She does use a heating pad as well. She reports that to help with the knee pain at night she has to have her leg up on the wall. She states she moves around a lot because sitting or standing for long periods will make it worse. Will occasionally have trouble bending down and she states the right leg has given out on her causing her to fall. She does have trouble going up/down stairs, she turns to the side when going down, and she also has breathing issues so when she goes up stairs to get inside she has to immediately get on oxygen. She reports inability to play with or lift her grandchildren and difficulty completing activities around her home for her son.  PERTINENT HISTORY:  See PMH above  PAIN:  Are you having pain? Yes:  NPRS scale: 8/10 currently, 10/10 at worst Pain location: Lower back and right leg/knee Pain description: Heavy, labor pains, constant Aggravating factors: Standing, sitting, activity Relieving factors: Lying on stomach, leaning on bed or counter, heat  PRECAUTIONS: Fall, patient reports she was told not to lift more than 10 lbs  FALLS:  Has patient fallen in last 6 months? Yes. Number of falls 3-4, right knee gives out or sharp pains could cause her to fall  PATIENT GOALS: Pain relief, get back to doing normal things   OBJECTIVE:  Note: Objective measures were completed at Evaluation unless otherwise noted. PATIENT SURVEYS:  Modified Oswestry 37/50 (74% disability)   08/29/2023: 33/50 (66%)  SENSATION: Patient reports neuropathy of feet and  hands  MUSCLE LENGTH: Unable to fully assess, severe increase in right lower back and leg pain when attempting to assess hamstring length  POSTURE:  Rounded shoulder posture, normal lumbar lordosis, weight shift toward left and patient frequently changing positions while seated for comfort  PALPATION: Exquisite tenderness to right lower lumbar paraspinals, right gluteal and TFL region, right lateral thigh  and knee; moderate tenderness to left lumbar paraspinals  Unable to assess lumbar mobility  LUMBAR ROM:   AROM eval  Flexion   Extension   Right lateral flexion   Left lateral flexion   Right rotation   Left rotation    (Blank rows = not tested)  LOWER EXTREMITY ROM:    Limitations in right hip ROM due to pain and guarding, right knee flexion limited due to pain  LOWER EXTREMITY MMT:    MMT Right eval Left eval Rt / Lt 07/08/2023 Right 07/22/2023 Right 07/31/2023 Rt / Lt 08/06/2023 Rt / Lt 08/29/2023  Hip flexion 3+ 4       Hip extension 2 3- 3 / 3   3 / 3+ 3 / 3+  Hip abduction 2     3 / 3+ 3 / 3+  Hip adduction         Hip internal rotation         Hip external rotation         Knee flexion 4- 4  4     Knee extension 4- 4+  4 4 4 4  / 5  Ankle dorsiflexion         Ankle plantarflexion         Ankle inversion         Ankle eversion          (Blank rows = not tested)  LUMBAR SPECIAL TESTS:  SLR positive on right  FUNCTIONAL TESTS:  Sit to stand: patient will difficulty standing from standard chair, requires UE for support with transferring from sit to stand 08/06/2023: patient able to stand from standard chair without support  GAIT: Assistive device utilized: None Level of assistance: Complete Independence Comments: Antalgic on right   TREATMENT [facility] Adult PT Treatment:                                                DATE: 09/25/2023 Recumbent bike L3 x 5 min to improve endurance and workload capacity Sit to stand 3 x 10 TRX squat 2 x 10 Standing hip abduction  and extension with red at knees 2 x 10 each Seated stability ball roll-out 10 x 5 sec Seated pelvic tilts 2 x 10 Forward 4 step-up 2 x 10 each  PATIENT EDUCATION:  Education details: HEP Person educated: Patient Education method: Programmer, multimedia, Demonstration, Actor cues, Verbal cues Education comprehension: verbalized understanding, returned demonstration, verbal cues required, tactile cues required, and needs further education  HOME EXERCISE PROGRAM: Access Code: [email or web address]    ASSESSMENT: CLINICAL IMPRESSION: Patient tolerated therapy well with no adverse effects. Therapy focused primarily on progressing her strength and mobility. She does continue to report lower back and right knee pain, and states the right lower back feels heavier. She was able to tolerate increase in number of reps with her exercises this visit. No changes were made to her HEP. Patient would benefit from continued skilled PT to progress mobility and strength in order to reduce pain and maximize functional ability.   Eval: Patient is a 48 y.o. female who was seen today for physical therapy evaluation and treatment for chronic lower back and right knee pain. Her symptoms do seem consistent with nonspecific lower back pain and right sided lumbar radiculopathy. Evaluation was limited this visit due to patient's high symptom severity and irritability level. She  did become tearful during the evaluation. Trial of e-stim IFC combined with cold pack with patient reporting pain level reduction, and provided patient with information for purchasing home TENS unit for pain relief. Unable to provide patient with HEP this visit due to inability to complete exercise without severe pain. Patient was provided extensive education on possible etiologies of her symptoms and treatments that could be utilized in therapy, and gradual progression for return to activity.   OBJECTIVE IMPAIRMENTS: Abnormal gait, decreased activity tolerance,  decreased balance, decreased ROM, decreased strength, impaired flexibility, postural dysfunction, and pain.   ACTIVITY LIMITATIONS: carrying, lifting, bending, sitting, standing, squatting, sleeping, stairs, transfers, bed mobility, locomotion level, and caring for others  PARTICIPATION LIMITATIONS: meal prep, cleaning, laundry, driving, shopping, and community activity  PERSONAL FACTORS: Fitness, Past/current experiences, Time since onset of injury/illness/exacerbation, and 3+ comorbidities: see PMH above are also affecting patient's functional outcome.    GOALS: Goals reviewed with patient? Yes  SHORT TERM GOALS: Target date: 09/26/2023  Patient will be I with initial HEP in order to progress with therapy. Baseline: None provided at eval 07/28/2023: patient reports independence with HEP Goal status: MET  2.  Patient will report low back and right leg pain </= 7/10 at worst to reduce functional limitations Baseline: 10/10 pain at worst 07/29/2023: reports pain at worst still 10/10 08/06/2023: continues to report pain 10/10 at worst 08/29/2023: continues to report pain 10/10 at worst 09/16/2023: continues to report pain 10/10 at worst Goal status: ONGOING  3.  Patient will be able to perform sit to stand without difficulty or using UE for support to improve mobility and indicate improvement in LE strength Baseline: requires UE for support with standing from chair 07/29/2023: able to perform sit to stand without UE support but exhibits difficulty  08/06/2023: able to perform sit to stand without UE support but exhibits difficulty due to knee pain 08/29/2023: patient can perform sit to stand without difficulty Goal status: MET  LONG TERM GOALS: Target date: 10/24/2023  Patient will be I with final HEP to maintain progress from PT. Baseline: None provided at eval 08/06/2023: progressing 08/29/2023: progressing Goal status: ONGOING  2.  Patient will report Modified Oswestry </= 25/50 (50%  disability) in order to indicate an improvement in their functional status Baseline: 37/50 (74% disability) 08/06/2023: not assessed 08/29/2023: 33/50 (66%) Goal status: ONGOING  3.  Patient will demonstrate improvement in core and hip strength grossly >/= 4-/5 MMT in order to improve her activity tolerance with household tasks Baseline: see limitations above 08/06/2023: see limitations above 08/29/2023: see limitations above Goal status: ONGOING  4.  Patient will demonstrate right knee strength >/= 4+/5 MMT in order to improve stair negotiation and reduce fall risk due to right knee giving out Baseline: see limitations above 08/06/2023: see limitations above 08/29/2023: see limitations above Goal status: ONGOING  5. Patient will report lower back and right leg pain </= 4/10 in order to reduce functional limitations  Baseline: 10/10 at worst  08/06/2023: continues to report pain 10/10 at worst 08/29/2023: continues to report pain 10/10 at worst 09/16/2023: continues to report pain 10/10 at worst  Goal status: ONGOING   PLAN: PT FREQUENCY: 1-2x/week  PT DURATION: 8 weeks  PLANNED INTERVENTIONS: 97164- PT Re-evaluation, 97110-Therapeutic exercises, 97530- Therapeutic activity, 97112- Neuromuscular re-education, 97535- Self Care, [id number]- Manual therapy, [id number]- Electrical stimulation (unattended), [phone]- Electrical stimulation (manual), Patient/Family education, Balance training, Taping, Dry Needling, Joint mobilization, Joint manipulation, Spinal manipulation, Spinal mobilization, Cryotherapy, and Moist heat.  PLAN FOR NEXT SESSION: Review HEP and progress PRN, continue with modalities for pain relief as needed, manual/TPDN for right lumbar and gluteal region, LAD, gentle stretching and mobility for lumbar, gentle core activation, hip and LE strengthening as tolerated    Elaine Daring, PT, DPT, LAT, ATC 09/25/23  11:08 AM Phone: [phone] Fax: [phone]

## 2023-09-26 LAB — LIPID PANEL
Chol/HDL Ratio: 2.9 ratio (ref 0.0–4.4)
Cholesterol, Total: 137 mg/dL (ref 100–199)
HDL: 48 mg/dL (ref >39)
LDL Chol Calc (NIH): 70 mg/dL (ref 0–99)
Triglycerides: 103 mg/dL (ref 0–149)
VLDL Cholesterol Cal: 19 mg/dL (ref 5–40)

## 2023-09-26 LAB — COMPREHENSIVE METABOLIC PANEL WITH GFR
ALT: 30 IU/L (ref 0–32)
AST: 28 IU/L (ref 0–40)
Albumin: 4.4 g/dL (ref 3.9–4.9)
Alkaline Phosphatase: 79 IU/L (ref 44–121)
BUN/Creatinine Ratio: 14 (ref 9–23)
BUN: 13 mg/dL (ref 6–24)
Bilirubin Total: 0.6 mg/dL (ref 0.0–1.2)
CO2: 21 mmol/L (ref 20–29)
Calcium: 9.6 mg/dL (ref 8.7–10.2)
Chloride: 104 mmol/L (ref 96–106)
Creatinine, Ser: 0.91 mg/dL (ref 0.57–1.00)
Globulin, Total: 3 g/dL (ref 1.5–4.5)
Glucose: 90 mg/dL (ref 70–99)
Potassium: 3.6 mmol/L (ref 3.5–5.2)
Sodium: 144 mmol/L (ref 134–144)
Total Protein: 7.4 g/dL (ref 6.0–8.5)
eGFR: 77 mL/min/{1.73_m2} (ref >59)

## 2023-09-29 ENCOUNTER — Ambulatory Visit (INDEPENDENT_AMBULATORY_CARE_PROVIDER_SITE_OTHER): Admitting: Physical Therapy

## 2023-09-29 ENCOUNTER — Encounter: Payer: Self-pay | Admitting: Physical Therapy

## 2023-09-29 ENCOUNTER — Other Ambulatory Visit: Payer: Self-pay

## 2023-09-29 DIAGNOSIS — M5459 Other low back pain: Secondary | ICD-10-CM | POA: Diagnosis not present

## 2023-09-29 DIAGNOSIS — M79604 Pain in right leg: Secondary | ICD-10-CM | POA: Diagnosis not present

## 2023-09-29 DIAGNOSIS — M6281 Muscle weakness (generalized): Secondary | ICD-10-CM | POA: Diagnosis not present

## 2023-09-29 NOTE — Therapy (Signed)
 OUTPATIENT PHYSICAL THERAPY TREATMENT    Patient Name: Erica [name] MRN: [id number] DOB:Aug 06, 1975, 48 y.o., female Today's Date: 09/29/2023   END OF SESSION:  PT End of Session - 09/29/23 0940     Visit Number 23    Number of Visits 34    Date for PT Re-Evaluation 10/24/23    Authorization Type [employer] MCR    Progress Note Due on Visit 30    PT Start Time 0933    PT Stop Time 1015    PT Time Calculation (min) 42 min    Activity Tolerance Patient tolerated treatment well    Behavior During Therapy [facility] for tasks assessed/performed                                Past Medical History:  Diagnosis Date   Allergy     Anxiety    Apnea, sleep 05/04/2014   AHI 31/hr now on CPAP at 12cm H2O   Arthritis    Asthma    Asthma without status asthmaticus 01/19/2016   Axillary hidradenitis suppurativa 08/01/2011   Blood transfusion without reported diagnosis    Breast cancer (HCC) 2010   a. locally advanced left breast carcinoma diagnosed in 2010 and treated with neoadjuvant chemotherapy with docetaxel and cyclophosphamide as well as paclitaxel. This was followed by radiation therapy which was completed in 2011.   Chemotherapy-induced peripheral neuropathy (HCC) 12/09/2016   CHF (congestive heart failure) (HCC)    Chronic diastolic CHF (congestive heart failure) (HCC) 06/23/2013   Chronic kidney disease    Chronic pain in left foot 06/29/2018   Chronic stable angina (HCC) 08/22/2020   Cough variant asthma 09/27/2010   Followed in Pulmonary clinic/ [facility]/ [place]  Onset reported in childhood  - PFT's 01/31/2011 FEV1  1.76 (60% ) ratio 68 and no better p B2,  DLCO 70% -med review 09/25/2012 > [place]  drug assistance  paperwork given    - HFA 90% 11/20/2012 .  -Med calendar 12/07/12 , 04/06/2013 , 04/18/2014,, 08/30/2014 , 11/08/2014  - PFT's 01/12/2013  1.73 (65%)  Ratio 73 and no better p B2, DLCO 86% - trial off    Depression with somatization 06/15/2015   Dyspnea on  exertion    a. 01/2013 Lexi MV: EF 54%, no ischemia/infarct;  b. 05/2013 Echo: EF 60-65%, no rwma, Gr 2 DD;  b.    Essential hypertension 10/09/2014   Estimated Creatinine Clearance: 82.4 mL/min (by C-G formula based on SCr of 0.98 mg/dL).   Fatty liver    Gastroparesis    GERD (gastroesophageal reflux disease)    Hyperglycemia 04/13/2011   Hypertension    Hypertensive heart disease 09/26/2015   Hypertriglyceridemia without hypercholesterolemia 06/15/2015   Framingham risk score is 1%    Idiopathic intracranial hypertension 10/21/2014   Impaired glucose tolerance 04/13/2011   Insomnia secondary to anxiety 04/05/2015   Lymphadenitis, chronic    restricted LEFT extremity   Malignant neoplasm of upper-outer quadrant of left breast in female, estrogen receptor negative (HCC) 01/07/2013   Stage III, Invasive Ductal, s/p partial Left Mastectomy/Lumpectomy (baseball sized) with Lymph Node dissection, had Chemo and RadRx     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   OSA (obstructive sleep apnea) 05/04/2014   AHI 31/hr now on CPAP at 12cm H2O   Oxygen deficiency    Uses 02 in CPAP   Persistent proteinuria 03/31/2018   Personal history of chemotherapy 2010   Personal  history of radiation therapy 2010   Resistant hypertension 09/17/2011       Sleep apnea    SVT (supraventricular tachycardia) (HCC) 08/22/2020   Tibialis posterior tendinitis, left 07/15/2018   Injected in March 10, 2019   Ulcer    Upper airway cough syndrome 10/09/2014   Onset around 2012 with assoc pseudowheeze MRI 05/18/15 > Paranasal sinuses are clear - 11/17/2015 rec increase h1 to 2 hs to help with noct cough  - Allergy  profile 11/17/2015 >  Eos 0.1 /  IgE  80 POS RAST  to grass, ragweed, cedar tree  - d/c acei 03/06/2018  - flare end of Jan 2020 in setting of ? Uri/ poorly controlled GERD - flare mid Oct 2020 with uri / assoc secondary gerd   Vitamin D  deficiency disease 03/31/2018   Past Surgical History:  Procedure  Laterality Date   BRAIN SURGERY     Shunt   BREAST LUMPECTOMY Left 09/2008   CARPAL TUNNEL RELEASE Left 2011   LYMPH NODE DISSECTION Left 2010   breast; 2 wks after breast lumpectomy   RENAL DENERVATION N/A 11/20/2022   Procedure: RENAL DENERVATION;  Surgeon: Darron Deatrice LABOR, MD;  Location: [facility] INVASIVE CV LAB;  Service: Cardiovascular;  Laterality: N/A;   RIGHT/LEFT HEART CATH AND CORONARY ANGIOGRAPHY N/A 05/04/2019   Procedure: RIGHT/LEFT HEART CATH AND CORONARY ANGIOGRAPHY;  Surgeon: Cherrie Toribio SAUNDERS, MD;  Location: [facility] INVASIVE CV LAB;  Service: Cardiovascular;  Laterality: N/A;  Renal injection preformed    RIGHT/LEFT HEART CATH AND CORONARY ANGIOGRAPHY N/A 10/25/2020   Procedure: RIGHT/LEFT HEART CATH AND CORONARY ANGIOGRAPHY;  Surgeon: Anner Alm ORN, MD;  Location: [facility] INVASIVE CV LAB;  Service: Cardiovascular;  Laterality: N/A;   TENDON RELEASE Right 2012   hand   TUBAL LIGATION  05/11/2011   Procedure: POST PARTUM TUBAL LIGATION;  Surgeon: Lynwood Solomons, MD;  Location: [facility] OR[facility];  Service: Gynecology;  Laterality: Bilateral;  Induced for HTN   Patient Active Problem List   Diagnosis Date Noted   Prediabetes 06/13/2023   Moderate episode of recurrent major depressive disorder (HCC) 02/04/2023   GAD (generalized anxiety disorder) 02/04/2023   Chronic respiratory failure with hypoxia (HCC) 12/31/2021   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   Coronary artery disease    Chronic stable angina (HCC) 08/22/2020   SVT (supraventricular tachycardia) (HCC) 08/22/2020   Palpitations 02/28/2020   Right ankle pain 04/14/2019   OSA (obstructive sleep apnea) 10/01/2018   Tibialis posterior tendinitis, left 07/15/2018   Chronic pain in left foot 06/29/2018   Vitamin D  deficiency disease 03/31/2018   Persistent proteinuria 03/31/2018   Hyperlipidemia LDL goal <100 03/30/2018   Chemotherapy-induced peripheral neuropathy (HCC) 12/09/2016   Asthma in adult, mild persistent, uncomplicated 01/19/2016    Hypertensive heart disease 09/26/2015   Depression with somatization 06/15/2015   Hypertriglyceridemia without hypercholesterolemia 06/15/2015   Hypokalemia 06/15/2015   Insomnia secondary to anxiety 04/05/2015   Idiopathic intracranial hypertension 10/21/2014   Upper airway cough syndrome 10/09/2014   Malignant hypertension 10/09/2014   Morbid obesity due to excess calories (HCC) 10/09/2014   Apnea, sleep 05/04/2014   Acute on chronic diastolic CHF (congestive heart failure), NYHA class 2 (HCC) 05/03/2014   DOE (dyspnea on exertion) 07/14/2013   Acute on chronic diastolic heart failure (HCC) 06/23/2013   Malignant neoplasm of upper-outer quadrant of left breast in female, estrogen receptor negative (HCC) 01/07/2013   Gastroparesis 01/04/2013   Resistant hypertension 09/17/2011   Axillary hidradenitis suppurativa 08/01/2011   Preventative  health care 04/13/2011   Hyperglycemia 04/13/2011   GERD (gastroesophageal reflux disease) 03/18/2011   Cough variant asthma 09/27/2010    PCP: Knute Thersia Bitters, FNP  REFERRING PROVIDER: Joane Artist RAMAN, MD  REFERRING DIAG: Lumbar radiculopathy  Rationale for Evaluation and Treatment: Rehabilitation  THERAPY DIAG:  Other low back pain  Pain in right leg  Muscle weakness (generalized)  ONSET DATE: August 2024   SUBJECTIVE:     SUBJECTIVE STATEMENT: Patient reports she went to church yesterday and it was alright, did have some trouble sitting through the service.  Eval: Patients reports she had renal denervation done in August 2024 and has been in pain ever since. Pain is located across the lower back, she was told it would hurt for 3-4 months but pain persisted in the lower back. The right knee pain started after in October or November, and it will keep her up at night. She states it feels heavy in her lower back, like labor pain that never stops. The pain will start in the right lower back region and then will feel like lightning  shooting down the right leg to the back and outside of the right knee, pain does not go past the knee. She leans on the bed for relief, and she states getting on her stomach gives her the most relief, but she has a lot of pain when getting up. She does use a heating pad as well. She reports that to help with the knee pain at night she has to have her leg up on the wall. She states she moves around a lot because sitting or standing for long periods will make it worse. Will occasionally have trouble bending down and she states the right leg has given out on her causing her to fall. She does have trouble going up/down stairs, she turns to the side when going down, and she also has breathing issues so when she goes up stairs to get inside she has to immediately get on oxygen. She reports inability to play with or lift her grandchildren and difficulty completing activities around her home for her son.  PERTINENT HISTORY:  See PMH above  PAIN:  Are you having pain? Yes:  NPRS scale: 6/10 currently, 10/10 at worst Pain location: Lower back and right leg/knee Pain description: Heavy, labor pains, constant Aggravating factors: Standing, sitting, activity Relieving factors: Lying on stomach, leaning on bed or counter, heat  PRECAUTIONS: Fall, patient reports she was told not to lift more than 10 lbs  FALLS:  Has patient fallen in last 6 months? Yes. Number of falls 3-4, right knee gives out or sharp pains could cause her to fall  PATIENT GOALS: Pain relief, get back to doing normal things   OBJECTIVE:  Note: Objective measures were completed at Evaluation unless otherwise noted. PATIENT SURVEYS:  Modified Oswestry 37/50 (74% disability)   08/29/2023: 33/50 (66%)  SENSATION: Patient reports neuropathy of feet and hands  MUSCLE LENGTH: Unable to fully assess, severe increase in right lower back and leg pain when attempting to assess hamstring length  POSTURE:  Rounded shoulder posture, normal  lumbar lordosis, weight shift toward left and patient frequently changing positions while seated for comfort  PALPATION: Exquisite tenderness to right lower lumbar paraspinals, right gluteal and TFL region, right lateral thigh and knee; moderate tenderness to left lumbar paraspinals  Unable to assess lumbar mobility  LUMBAR ROM:   AROM eval  Flexion   Extension   Right lateral flexion  Left lateral flexion   Right rotation   Left rotation    (Blank rows = not tested)  LOWER EXTREMITY ROM:    Limitations in right hip ROM due to pain and guarding, right knee flexion limited due to pain  LOWER EXTREMITY MMT:    MMT Right eval Left eval Rt / Lt 07/08/2023 Right 07/22/2023 Right 07/31/2023 Rt / Lt 08/06/2023 Rt / Lt 08/29/2023  Hip flexion 3+ 4       Hip extension 2 3- 3 / 3   3 / 3+ 3 / 3+  Hip abduction 2     3 / 3+ 3 / 3+  Hip adduction         Hip internal rotation         Hip external rotation         Knee flexion 4- 4  4     Knee extension 4- 4+  4 4 4 4  / 5  Ankle dorsiflexion         Ankle plantarflexion         Ankle inversion         Ankle eversion          (Blank rows = not tested)  LUMBAR SPECIAL TESTS:  SLR positive on right  FUNCTIONAL TESTS:  Sit to stand: patient will difficulty standing from standard chair, requires UE for support with transferring from sit to stand 08/06/2023: patient able to stand from standard chair without support  GAIT: Assistive device utilized: None Level of assistance: Complete Independence Comments: Antalgic on right   TREATMENT [facility] Adult PT Treatment:                                                DATE: 09/29/2023 Recumbent bike L3 x 5 min to improve endurance and workload capacity Sit to stand 3 x 10 TRX squat 2 x 10 Forward 4 step-up 2 x 10 each LAQ with 5# 3 x 10 each Standing hip abduction and extension with green at knees 2 x 10 each Seated abdominal set stability ball press 2 x 10 x 5 sec Deadlift from 8 box with  15# 2 x 5  PATIENT EDUCATION:  Education details: HEP Person educated: Patient Education method: Programmer, multimedia, Demonstration, Actor cues, Verbal cues Education comprehension: verbalized understanding, returned demonstration, verbal cues required, tactile cues required, and needs further education  HOME EXERCISE PROGRAM: Access Code: [email or web address]    ASSESSMENT: CLINICAL IMPRESSION: Patient tolerated therapy well with no adverse effects. Therapy continues to focus on progressing her LE strengthening. She does seem to be tolerating exercises better and reports improvement with her step-ups this visit. Increased banded resistance for standing hip strengthening exercises and incorporated some lifting this visit. No changes made to her HEP. Patient would benefit from continued skilled PT to progress mobility and strength in order to reduce pain and maximize functional ability.   Eval: Patient is a 48 y.o. female who was seen today for physical therapy evaluation and treatment for chronic lower back and right knee pain. Her symptoms do seem consistent with nonspecific lower back pain and right sided lumbar radiculopathy. Evaluation was limited this visit due to patient's high symptom severity and irritability level. She did become tearful during the evaluation. Trial of e-stim IFC combined with cold pack with patient reporting pain level reduction, and provided patient with  information for purchasing home TENS unit for pain relief. Unable to provide patient with HEP this visit due to inability to complete exercise without severe pain. Patient was provided extensive education on possible etiologies of her symptoms and treatments that could be utilized in therapy, and gradual progression for return to activity.   OBJECTIVE IMPAIRMENTS: Abnormal gait, decreased activity tolerance, decreased balance, decreased ROM, decreased strength, impaired flexibility, postural dysfunction, and pain.   ACTIVITY  LIMITATIONS: carrying, lifting, bending, sitting, standing, squatting, sleeping, stairs, transfers, bed mobility, locomotion level, and caring for others  PARTICIPATION LIMITATIONS: meal prep, cleaning, laundry, driving, shopping, and community activity  PERSONAL FACTORS: Fitness, Past/current experiences, Time since onset of injury/illness/exacerbation, and 3+ comorbidities: see PMH above are also affecting patient's functional outcome.    GOALS: Goals reviewed with patient? Yes  SHORT TERM GOALS: Target date: 09/26/2023  Patient will be I with initial HEP in order to progress with therapy. Baseline: None provided at eval 07/28/2023: patient reports independence with HEP Goal status: MET  2.  Patient will report low back and right leg pain </= 7/10 at worst to reduce functional limitations Baseline: 10/10 pain at worst 07/29/2023: reports pain at worst still 10/10 08/06/2023: continues to report pain 10/10 at worst 08/29/2023: continues to report pain 10/10 at worst 09/16/2023: continues to report pain 10/10 at worst Goal status: ONGOING  3.  Patient will be able to perform sit to stand without difficulty or using UE for support to improve mobility and indicate improvement in LE strength Baseline: requires UE for support with standing from chair 07/29/2023: able to perform sit to stand without UE support but exhibits difficulty  08/06/2023: able to perform sit to stand without UE support but exhibits difficulty due to knee pain 08/29/2023: patient can perform sit to stand without difficulty Goal status: MET  LONG TERM GOALS: Target date: 10/24/2023  Patient will be I with final HEP to maintain progress from PT. Baseline: None provided at eval 08/06/2023: progressing 08/29/2023: progressing Goal status: ONGOING  2.  Patient will report Modified Oswestry </= 25/50 (50% disability) in order to indicate an improvement in their functional status Baseline: 37/50 (74% disability) 08/06/2023: not  assessed 08/29/2023: 33/50 (66%) Goal status: ONGOING  3.  Patient will demonstrate improvement in core and hip strength grossly >/= 4-/5 MMT in order to improve her activity tolerance with household tasks Baseline: see limitations above 08/06/2023: see limitations above 08/29/2023: see limitations above Goal status: ONGOING  4.  Patient will demonstrate right knee strength >/= 4+/5 MMT in order to improve stair negotiation and reduce fall risk due to right knee giving out Baseline: see limitations above 08/06/2023: see limitations above 08/29/2023: see limitations above Goal status: ONGOING  5. Patient will report lower back and right leg pain </= 4/10 in order to reduce functional limitations  Baseline: 10/10 at worst  08/06/2023: continues to report pain 10/10 at worst 08/29/2023: continues to report pain 10/10 at worst 09/16/2023: continues to report pain 10/10 at worst  Goal status: ONGOING   PLAN: PT FREQUENCY: 1-2x/week  PT DURATION: 8 weeks  PLANNED INTERVENTIONS: 97164- PT Re-evaluation, 97110-Therapeutic exercises, 97530- Therapeutic activity, 97112- Neuromuscular re-education, 97535- Self Care, [id number]- Manual therapy, [id number]- Electrical stimulation (unattended), [phone]- Electrical stimulation (manual), Patient/Family education, Balance training, Taping, Dry Needling, Joint mobilization, Joint manipulation, Spinal manipulation, Spinal mobilization, Cryotherapy, and Moist heat.  PLAN FOR NEXT SESSION: Review HEP and progress PRN, continue with modalities for pain relief as needed, manual/TPDN for right lumbar and gluteal  region, LAD, gentle stretching and mobility for lumbar, gentle core activation, hip and LE strengthening as tolerated    Elaine Daring, PT, DPT, LAT, ATC 09/29/23  10:22 AM Phone: [phone] Fax: [phone]

## 2023-10-01 ENCOUNTER — Encounter: Payer: Self-pay | Admitting: Physical Therapy

## 2023-10-01 ENCOUNTER — Ambulatory Visit: Payer: Self-pay | Admitting: Cardiovascular Disease

## 2023-10-01 ENCOUNTER — Ambulatory Visit (INDEPENDENT_AMBULATORY_CARE_PROVIDER_SITE_OTHER): Admitting: Physical Therapy

## 2023-10-01 ENCOUNTER — Other Ambulatory Visit: Payer: Self-pay

## 2023-10-01 DIAGNOSIS — M79604 Pain in right leg: Secondary | ICD-10-CM

## 2023-10-01 DIAGNOSIS — M6281 Muscle weakness (generalized): Secondary | ICD-10-CM | POA: Diagnosis not present

## 2023-10-01 DIAGNOSIS — M5459 Other low back pain: Secondary | ICD-10-CM | POA: Diagnosis not present

## 2023-10-01 NOTE — Therapy (Signed)
 OUTPATIENT PHYSICAL THERAPY TREATMENT    Patient Name: Erica [name] MRN: [id number] DOB:1975/12/01, 48 y.o., female Today's Date: 10/01/2023   END OF SESSION:  PT End of Session - 10/01/23 1154     Visit Number 24    Number of Visits 34    Date for PT Re-Evaluation 10/24/23    Authorization Type [employer] MCR Dual Complete    Progress Note Due on Visit 30    PT Start Time 1145    PT Stop Time 1230    PT Time Calculation (min) 45 min    Activity Tolerance Patient tolerated treatment well    Behavior During Therapy WFL for tasks assessed/performed                                 Past Medical History:  Diagnosis Date   Allergy     Anxiety    Apnea, sleep 05/04/2014   AHI 31/hr now on CPAP at 12cm H2O   Arthritis    Asthma    Asthma without status asthmaticus 01/19/2016   Axillary hidradenitis suppurativa 08/01/2011   Blood transfusion without reported diagnosis    Breast cancer (HCC) 2010   a. locally advanced left breast carcinoma diagnosed in 2010 and treated with neoadjuvant chemotherapy with docetaxel and cyclophosphamide as well as paclitaxel. This was followed by radiation therapy which was completed in 2011.   Chemotherapy-induced peripheral neuropathy (HCC) 12/09/2016   CHF (congestive heart failure) (HCC)    Chronic diastolic CHF (congestive heart failure) (HCC) 06/23/2013   Chronic kidney disease    Chronic pain in left foot 06/29/2018   Chronic stable angina (HCC) 08/22/2020   Cough variant asthma 09/27/2010   Followed in Pulmonary clinic/ [facility]/ [place]  Onset reported in childhood  - PFT's 01/31/2011 FEV1  1.76 (60% ) ratio 68 and no better p B2,  DLCO 70% -med review 09/25/2012 > [place]  drug assistance  paperwork given    - HFA 90% 11/20/2012 .  -Med calendar 12/07/12 , 04/06/2013 , 04/18/2014,, 08/30/2014 , 11/08/2014  - PFT's 01/12/2013  1.73 (65%)  Ratio 73 and no better p B2, DLCO 86% - trial off    Depression with somatization 06/15/2015    Dyspnea on exertion    a. 01/2013 Lexi MV: EF 54%, no ischemia/infarct;  b. 05/2013 Echo: EF 60-65%, no rwma, Gr 2 DD;  b.    Essential hypertension 10/09/2014   Estimated Creatinine Clearance: 82.4 mL/min (by C-G formula based on SCr of 0.98 mg/dL).   Fatty liver    Gastroparesis    GERD (gastroesophageal reflux disease)    Hyperglycemia 04/13/2011   Hypertension    Hypertensive heart disease 09/26/2015   Hypertriglyceridemia without hypercholesterolemia 06/15/2015   Framingham risk score is 1%    Idiopathic intracranial hypertension 10/21/2014   Impaired glucose tolerance 04/13/2011   Insomnia secondary to anxiety 04/05/2015   Lymphadenitis, chronic    restricted LEFT extremity   Malignant neoplasm of upper-outer quadrant of left breast in female, estrogen receptor negative (HCC) 01/07/2013   Stage III, Invasive Ductal, s/p partial Left Mastectomy/Lumpectomy (baseball sized) with Lymph Node dissection, had Chemo and RadRx     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   OSA (obstructive sleep apnea) 05/04/2014   AHI 31/hr now on CPAP at 12cm H2O   Oxygen deficiency    Uses 02 in CPAP   Persistent proteinuria 03/31/2018   Personal history of chemotherapy 2010  Personal history of radiation therapy 2010   Resistant hypertension 09/17/2011       Sleep apnea    SVT (supraventricular tachycardia) (HCC) 08/22/2020   Tibialis posterior tendinitis, left 07/15/2018   Injected in March 10, 2019   Ulcer    Upper airway cough syndrome 10/09/2014   Onset around 2012 with assoc pseudowheeze MRI 05/18/15 > Paranasal sinuses are clear - 11/17/2015 rec increase h1 to 2 hs to help with noct cough  - Allergy  profile 11/17/2015 >  Eos 0.1 /  IgE  80 POS RAST  to grass, ragweed, cedar tree  - d/c acei 03/06/2018  - flare end of Jan 2020 in setting of ? Uri/ poorly controlled GERD - flare mid Oct 2020 with uri / assoc secondary gerd   Vitamin D  deficiency disease 03/31/2018   Past Surgical History:   Procedure Laterality Date   BRAIN SURGERY     Shunt   BREAST LUMPECTOMY Left 09/2008   CARPAL TUNNEL RELEASE Left 2011   LYMPH NODE DISSECTION Left 2010   breast; 2 wks after breast lumpectomy   RENAL DENERVATION N/A 11/20/2022   Procedure: RENAL DENERVATION;  Surgeon: Darron Deatrice LABOR, MD;  Location: [facility] INVASIVE CV LAB;  Service: Cardiovascular;  Laterality: N/A;   RIGHT/LEFT HEART CATH AND CORONARY ANGIOGRAPHY N/A 05/04/2019   Procedure: RIGHT/LEFT HEART CATH AND CORONARY ANGIOGRAPHY;  Surgeon: Cherrie Toribio SAUNDERS, MD;  Location: [facility] INVASIVE CV LAB;  Service: Cardiovascular;  Laterality: N/A;  Renal injection preformed    RIGHT/LEFT HEART CATH AND CORONARY ANGIOGRAPHY N/A 10/25/2020   Procedure: RIGHT/LEFT HEART CATH AND CORONARY ANGIOGRAPHY;  Surgeon: Anner Alm ORN, MD;  Location: [facility] INVASIVE CV LAB;  Service: Cardiovascular;  Laterality: N/A;   TENDON RELEASE Right 2012   hand   TUBAL LIGATION  05/11/2011   Procedure: POST PARTUM TUBAL LIGATION;  Surgeon: Lynwood Solomons, MD;  Location: [facility];  Service: Gynecology;  Laterality: Bilateral;  Induced for HTN   Patient Active Problem List   Diagnosis Date Noted   Prediabetes 06/13/2023   Moderate episode of recurrent major depressive disorder (HCC) 02/04/2023   GAD (generalized anxiety disorder) 02/04/2023   Chronic respiratory failure with hypoxia (HCC) 12/31/2021   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   Coronary artery disease    Chronic stable angina (HCC) 08/22/2020   SVT (supraventricular tachycardia) (HCC) 08/22/2020   Palpitations 02/28/2020   Right ankle pain 04/14/2019   OSA (obstructive sleep apnea) 10/01/2018   Tibialis posterior tendinitis, left 07/15/2018   Chronic pain in left foot 06/29/2018   Vitamin D  deficiency disease 03/31/2018   Persistent proteinuria 03/31/2018   Hyperlipidemia LDL goal <100 03/30/2018   Chemotherapy-induced peripheral neuropathy (HCC) 12/09/2016   Asthma in adult, mild persistent, uncomplicated  01/19/2016   Hypertensive heart disease 09/26/2015   Depression with somatization 06/15/2015   Hypertriglyceridemia without hypercholesterolemia 06/15/2015   Hypokalemia 06/15/2015   Insomnia secondary to anxiety 04/05/2015   Idiopathic intracranial hypertension 10/21/2014   Upper airway cough syndrome 10/09/2014   Malignant hypertension 10/09/2014   Morbid obesity due to excess calories (HCC) 10/09/2014   Apnea, sleep 05/04/2014   Acute on chronic diastolic CHF (congestive heart failure), NYHA class 2 (HCC) 05/03/2014   DOE (dyspnea on exertion) 07/14/2013   Acute on chronic diastolic heart failure (HCC) 06/23/2013   Malignant neoplasm of upper-outer quadrant of left breast in female, estrogen receptor negative (HCC) 01/07/2013   Gastroparesis 01/04/2013   Resistant hypertension 09/17/2011   Axillary hidradenitis suppurativa 08/01/2011  Preventative health care 04/13/2011   Hyperglycemia 04/13/2011   GERD (gastroesophageal reflux disease) 03/18/2011   Cough variant asthma 09/27/2010    PCP: Knute Thersia Bitters, FNP  REFERRING PROVIDER: Joane Artist RAMAN, MD  REFERRING DIAG: Lumbar radiculopathy  Rationale for Evaluation and Treatment: Rehabilitation  THERAPY DIAG:  Other low back pain  Pain in right leg  Muscle weakness (generalized)  ONSET DATE: August 2024   SUBJECTIVE:     SUBJECTIVE STATEMENT: Patient reports she went to church yesterday and it was alright, did have some trouble sitting through the service.  Eval: Patients reports she had renal denervation done in August 2024 and has been in pain ever since. Pain is located across the lower back, she was told it would hurt for 3-4 months but pain persisted in the lower back. The right knee pain started after in October or November, and it will keep her up at night. She states it feels heavy in her lower back, like labor pain that never stops. The pain will start in the right lower back region and then will feel like  lightning shooting down the right leg to the back and outside of the right knee, pain does not go past the knee. She leans on the bed for relief, and she states getting on her stomach gives her the most relief, but she has a lot of pain when getting up. She does use a heating pad as well. She reports that to help with the knee pain at night she has to have her leg up on the wall. She states she moves around a lot because sitting or standing for long periods will make it worse. Will occasionally have trouble bending down and she states the right leg has given out on her causing her to fall. She does have trouble going up/down stairs, she turns to the side when going down, and she also has breathing issues so when she goes up stairs to get inside she has to immediately get on oxygen. She reports inability to play with or lift her grandchildren and difficulty completing activities around her home for her son.  PERTINENT HISTORY:  See PMH above  PAIN:  Are you having pain? Yes:  NPRS scale: 6/10 currently, 10/10 at worst Pain location: Lower back and right leg/knee Pain description: Heavy, labor pains, constant Aggravating factors: Standing, sitting, activity Relieving factors: Lying on stomach, leaning on bed or counter, heat  PRECAUTIONS: Fall, patient reports she was told not to lift more than 10 lbs  FALLS:  Has patient fallen in last 6 months? Yes. Number of falls 3-4, right knee gives out or sharp pains could cause her to fall  PATIENT GOALS: Pain relief, get back to doing normal things   OBJECTIVE:  Note: Objective measures were completed at Evaluation unless otherwise noted. PATIENT SURVEYS:  Modified Oswestry 37/50 (74% disability)   08/29/2023: 33/50 (66%)  SENSATION: Patient reports neuropathy of feet and hands  MUSCLE LENGTH: Unable to fully assess, severe increase in right lower back and leg pain when attempting to assess hamstring length  POSTURE:  Rounded shoulder posture,  normal lumbar lordosis, weight shift toward left and patient frequently changing positions while seated for comfort  PALPATION: Exquisite tenderness to right lower lumbar paraspinals, right gluteal and TFL region, right lateral thigh and knee; moderate tenderness to left lumbar paraspinals  Unable to assess lumbar mobility  LUMBAR ROM:   AROM eval  Flexion   Extension   Right lateral flexion  Left lateral flexion   Right rotation   Left rotation    (Blank rows = not tested)  LOWER EXTREMITY ROM:    Limitations in right hip ROM due to pain and guarding, right knee flexion limited due to pain  LOWER EXTREMITY MMT:    MMT Right eval Left eval Rt / Lt 07/08/2023 Right 07/22/2023 Right 07/31/2023 Rt / Lt 08/06/2023 Rt / Lt 08/29/2023  Hip flexion 3+ 4       Hip extension 2 3- 3 / 3   3 / 3+ 3 / 3+  Hip abduction 2     3 / 3+ 3 / 3+  Hip adduction         Hip internal rotation         Hip external rotation         Knee flexion 4- 4  4     Knee extension 4- 4+  4 4 4 4  / 5  Ankle dorsiflexion         Ankle plantarflexion         Ankle inversion         Ankle eversion          (Blank rows = not tested)  LUMBAR SPECIAL TESTS:  SLR positive on right  FUNCTIONAL TESTS:  Sit to stand: patient will difficulty standing from standard chair, requires UE for support with transferring from sit to stand 08/06/2023: patient able to stand from standard chair without support  GAIT: Assistive device utilized: None Level of assistance: Complete Independence Comments: Antalgic on right   TREATMENT [facility] Adult PT Treatment:                                                DATE: 10/01/2023 Recumbent bike L3 x 5 min to improve endurance and workload capacity Seated pelvic tilt 2 x 10 Seated abdominal set stability ball press 2 x 10 x 5 sec Sit to stand 3 x 10 Deadlift from 8 box with 15# x 5, 20# 2 x 5 Seated lumbar flexion stability ball roll out stretch 5 x 10 sec Standing hip abduction  and extension with green at knees 2 x 10 each Seated lumbar extension holding dowel with black band 2 x 10 x 5 sec  PATIENT EDUCATION:  Education details: HEP Person educated: Patient Education method: Programmer, multimedia, Demonstration, Actor cues, Verbal cues Education comprehension: verbalized understanding, returned demonstration, verbal cues required, tactile cues required, and needs further education  HOME EXERCISE PROGRAM: Access Code: [email or web address]    ASSESSMENT: CLINICAL IMPRESSION: Patient tolerated therapy well with no adverse effects. Therapy continues to focus on progressing her core, hip, and LE strengthening. She was able to progress with her lifting this visit and incorporated more lumbar strengthening. She does continue to report right knee and low back pain but is tolerating more strengthening exercises in therapy. No changes made to her HEP. Patient would benefit from continued skilled PT to progress mobility and strength in order to reduce pain and maximize functional ability.   Eval: Patient is a 48 y.o. female who was seen today for physical therapy evaluation and treatment for chronic lower back and right knee pain. Her symptoms do seem consistent with nonspecific lower back pain and right sided lumbar radiculopathy. Evaluation was limited this visit due to patient's high symptom severity and irritability level. She did  become tearful during the evaluation. Trial of e-stim IFC combined with cold pack with patient reporting pain level reduction, and provided patient with information for purchasing home TENS unit for pain relief. Unable to provide patient with HEP this visit due to inability to complete exercise without severe pain. Patient was provided extensive education on possible etiologies of her symptoms and treatments that could be utilized in therapy, and gradual progression for return to activity.   OBJECTIVE IMPAIRMENTS: Abnormal gait, decreased activity tolerance, decreased  balance, decreased ROM, decreased strength, impaired flexibility, postural dysfunction, and pain.   ACTIVITY LIMITATIONS: carrying, lifting, bending, sitting, standing, squatting, sleeping, stairs, transfers, bed mobility, locomotion level, and caring for others  PARTICIPATION LIMITATIONS: meal prep, cleaning, laundry, driving, shopping, and community activity  PERSONAL FACTORS: Fitness, Past/current experiences, Time since onset of injury/illness/exacerbation, and 3+ comorbidities: see PMH above are also affecting patient's functional outcome.    GOALS: Goals reviewed with patient? Yes  SHORT TERM GOALS: Target date: 09/26/2023  Patient will be I with initial HEP in order to progress with therapy. Baseline: None provided at eval 07/28/2023: patient reports independence with HEP Goal status: MET  2.  Patient will report low back and right leg pain </= 7/10 at worst to reduce functional limitations Baseline: 10/10 pain at worst 07/29/2023: reports pain at worst still 10/10 08/06/2023: continues to report pain 10/10 at worst 08/29/2023: continues to report pain 10/10 at worst 09/16/2023: continues to report pain 10/10 at worst Goal status: ONGOING  3.  Patient will be able to perform sit to stand without difficulty or using UE for support to improve mobility and indicate improvement in LE strength Baseline: requires UE for support with standing from chair 07/29/2023: able to perform sit to stand without UE support but exhibits difficulty  08/06/2023: able to perform sit to stand without UE support but exhibits difficulty due to knee pain 08/29/2023: patient can perform sit to stand without difficulty Goal status: MET  LONG TERM GOALS: Target date: 10/24/2023  Patient will be I with final HEP to maintain progress from PT. Baseline: None provided at eval 08/06/2023: progressing 08/29/2023: progressing Goal status: ONGOING  2.  Patient will report Modified Oswestry </= 25/50 (50% disability) in  order to indicate an improvement in their functional status Baseline: 37/50 (74% disability) 08/06/2023: not assessed 08/29/2023: 33/50 (66%) Goal status: ONGOING  3.  Patient will demonstrate improvement in core and hip strength grossly >/= 4-/5 MMT in order to improve her activity tolerance with household tasks Baseline: see limitations above 08/06/2023: see limitations above 08/29/2023: see limitations above Goal status: ONGOING  4.  Patient will demonstrate right knee strength >/= 4+/5 MMT in order to improve stair negotiation and reduce fall risk due to right knee giving out Baseline: see limitations above 08/06/2023: see limitations above 08/29/2023: see limitations above Goal status: ONGOING  5. Patient will report lower back and right leg pain </= 4/10 in order to reduce functional limitations  Baseline: 10/10 at worst  08/06/2023: continues to report pain 10/10 at worst 08/29/2023: continues to report pain 10/10 at worst 09/16/2023: continues to report pain 10/10 at worst  Goal status: ONGOING   PLAN: PT FREQUENCY: 1-2x/week  PT DURATION: 8 weeks  PLANNED INTERVENTIONS: 97164- PT Re-evaluation, 97110-Therapeutic exercises, 97530- Therapeutic activity, 97112- Neuromuscular re-education, 97535- Self Care, [id number]- Manual therapy, [id number]- Electrical stimulation (unattended), [phone]- Electrical stimulation (manual), Patient/Family education, Balance training, Taping, Dry Needling, Joint mobilization, Joint manipulation, Spinal manipulation, Spinal mobilization, Cryotherapy, and Moist heat.  PLAN FOR NEXT SESSION: Review HEP and progress PRN, continue with modalities for pain relief as needed, manual/TPDN for right lumbar and gluteal region, LAD, gentle stretching and mobility for lumbar, gentle core activation, hip and LE strengthening as tolerated    Elaine Daring, PT, DPT, LAT, ATC 10/01/23  12:37 PM Phone: [phone] Fax: [phone]

## 2023-10-06 ENCOUNTER — Encounter: Payer: Self-pay | Admitting: Physical Therapy

## 2023-10-06 ENCOUNTER — Ambulatory Visit (INDEPENDENT_AMBULATORY_CARE_PROVIDER_SITE_OTHER): Admitting: Physical Therapy

## 2023-10-06 ENCOUNTER — Other Ambulatory Visit: Payer: Self-pay

## 2023-10-06 DIAGNOSIS — M79604 Pain in right leg: Secondary | ICD-10-CM | POA: Diagnosis not present

## 2023-10-06 DIAGNOSIS — M5459 Other low back pain: Secondary | ICD-10-CM | POA: Diagnosis not present

## 2023-10-06 DIAGNOSIS — M6281 Muscle weakness (generalized): Secondary | ICD-10-CM

## 2023-10-06 NOTE — Patient Instructions (Signed)
 Access Code: [email or web address] URL: [email or web address] Date: 10/06/2023 Prepared by: Elaine Daring  Exercises - Seated Hip Abduction with Resistance  - 1-2 x daily - 2 sets - 10 reps - Seated Pelvic Tilt  - 1-2 x daily - 2 sets - 10 reps - Seated Long Arc Quad  - 1-2 x daily - 2 sets - 10 reps - Sit to Stand  - 1-2 x daily - 2 sets - 10 reps - Standing Hip Abduction with Counter Support  - 1-2 x daily - 2 sets - 10 reps - Standing Hip Extension with Counter Support  - 1-2 x daily - 2 sets - 10 reps - Heel Toe Raises with Counter Support  - 1 x daily - 3 sets - 10 reps - Standing Lumbar Extension at Wall - Forearms  - 1-2 x daily - 2 sets - 10 reps

## 2023-10-06 NOTE — Therapy (Signed)
 OUTPATIENT PHYSICAL THERAPY TREATMENT    Patient Name: Erica [name] MRN: [id number] DOB:[birth year], 48 y.o., female Today's Date: 10/06/2023   END OF SESSION:  PT End of Session - 10/06/23 1024     Visit Number 25    Number of Visits 34    Date for PT Re-Evaluation 10/24/23    Authorization Type UHC MCR Dual Complete    Progress Note Due on Visit 30    PT Start Time 1018    PT Stop Time 1100    PT Time Calculation (min) 42 min    Activity Tolerance Patient tolerated treatment well    Behavior During Therapy WFL for tasks assessed/performed                                  Past Medical History:  Diagnosis Date   Allergy     Anxiety    Apnea, sleep 05/04/2014   AHI 31/hr now on CPAP at 12cm H2O   Arthritis    Asthma    Asthma without status asthmaticus 01/19/2016   Axillary hidradenitis suppurativa 08/01/2011   Blood transfusion without reported diagnosis    Breast cancer (HCC) 2010   a. locally advanced left breast carcinoma diagnosed in 2010 and treated with neoadjuvant chemotherapy with docetaxel and cyclophosphamide as well as paclitaxel. This was followed by radiation therapy which was completed in 2011.   Chemotherapy-induced peripheral neuropathy (HCC) 12/09/2016   CHF (congestive heart failure) (HCC)    Chronic diastolic CHF (congestive heart failure) (HCC) 06/23/2013   Chronic kidney disease    Chronic pain in left foot 06/29/2018   Chronic stable angina (HCC) 08/22/2020   Cough variant asthma 09/27/2010   Followed in Pulmonary clinic/ [facility]/ [place]  Onset reported in childhood  - PFT's 01/31/2011 FEV1  1.76 (60% ) ratio 68 and no better p B2,  DLCO 70% -med review 09/25/2012 > [place]  drug assistance  paperwork given    - HFA 90% 11/20/2012 .  -Med calendar 12/07/12 , 04/06/2013 , 04/18/2014,, 08/30/2014 , 11/08/2014  - PFT's 01/12/2013  1.73 (65%)  Ratio 73 and no better p B2, DLCO 86% - trial off    Depression with somatization  06/15/2015   Dyspnea on exertion    a. 01/2013 Lexi MV: EF 54%, no ischemia/infarct;  b. 05/2013 Echo: EF 60-65%, no rwma, Gr 2 DD;  b.    Essential hypertension 10/09/2014   Estimated Creatinine Clearance: 82.4 mL/min (by C-G formula based on SCr of 0.98 mg/dL).   Fatty liver    Gastroparesis    GERD (gastroesophageal reflux disease)    Hyperglycemia 04/13/2011   Hypertension    Hypertensive heart disease 09/26/2015   Hypertriglyceridemia without hypercholesterolemia 06/15/2015   Framingham risk score is 1%    Idiopathic intracranial hypertension 10/21/2014   Impaired glucose tolerance 04/13/2011   Insomnia secondary to anxiety 04/05/2015   Lymphadenitis, chronic    restricted LEFT extremity   Malignant neoplasm of upper-outer quadrant of left breast in female, estrogen receptor negative (HCC) 01/07/2013   Stage III, Invasive Ductal, s/p partial Left Mastectomy/Lumpectomy (baseball sized) with Lymph Node dissection, had Chemo and RadRx     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   OSA (obstructive sleep apnea) 05/04/2014   AHI 31/hr now on CPAP at 12cm H2O   Oxygen deficiency    Uses 02 in CPAP   Persistent proteinuria 03/31/2018   Personal history of chemotherapy  2010   Personal history of radiation therapy 2010   Resistant hypertension 09/17/2011       Sleep apnea    SVT (supraventricular tachycardia) (HCC) 08/22/2020   Tibialis posterior tendinitis, left 07/15/2018   Injected in March 10, 2019   Ulcer    Upper airway cough syndrome 10/09/2014   Onset around 2012 with assoc pseudowheeze MRI 05/18/15 > Paranasal sinuses are clear - 11/17/2015 rec increase h1 to 2 hs to help with noct cough  - Allergy  profile 11/17/2015 >  Eos 0.1 /  IgE  80 POS RAST  to grass, ragweed, cedar tree  - d/c acei 03/06/2018  - flare end of Jan 2020 in setting of ? Uri/ poorly controlled GERD - flare mid Oct 2020 with uri / assoc secondary gerd   Vitamin D  deficiency disease 03/31/2018   Past Surgical  History:  Procedure Laterality Date   BRAIN SURGERY     Shunt   BREAST LUMPECTOMY Left 09/2008   CARPAL TUNNEL RELEASE Left 2011   LYMPH NODE DISSECTION Left 2010   breast; 2 wks after breast lumpectomy   RENAL DENERVATION N/A 11/20/2022   Procedure: RENAL DENERVATION;  Surgeon: Darron Deatrice LABOR, MD;  Location: [facility] INVASIVE CV LAB;  Service: Cardiovascular;  Laterality: N/A;   RIGHT/LEFT HEART CATH AND CORONARY ANGIOGRAPHY N/A 05/04/2019   Procedure: RIGHT/LEFT HEART CATH AND CORONARY ANGIOGRAPHY;  Surgeon: Cherrie Toribio SAUNDERS, MD;  Location: [facility] INVASIVE CV [facility];  Service: Cardiovascular;  Laterality: N/A;  Renal injection preformed    RIGHT/LEFT HEART CATH AND CORONARY ANGIOGRAPHY N/A 10/25/2020   Procedure: RIGHT/LEFT HEART CATH AND CORONARY ANGIOGRAPHY;  Surgeon: Anner Alm ORN, MD;  Location: [facility] INVASIVE CV LAB;  Service: Cardiovascular;  Laterality: N/A;   TENDON RELEASE Right 2012   hand   TUBAL LIGATION  05/11/2011   Procedure: POST PARTUM TUBAL LIGATION;  Surgeon: Lynwood Solomons, MD;  Location: [facility];  Service: Gynecology;  Laterality: Bilateral;  Induced for HTN   Patient Active Problem List   Diagnosis Date Noted   Prediabetes 06/13/2023   Moderate episode of recurrent major depressive disorder (HCC) 02/04/2023   GAD (generalized anxiety disorder) 02/04/2023   Chronic respiratory failure with hypoxia (HCC) 12/31/2021   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   Coronary artery disease    Chronic stable angina (HCC) 08/22/2020   SVT (supraventricular tachycardia) (HCC) 08/22/2020   Palpitations 02/28/2020   Right ankle pain 04/14/2019   OSA (obstructive sleep apnea) 10/01/2018   Tibialis posterior tendinitis, left 07/15/2018   Chronic pain in left foot 06/29/2018   Vitamin D  deficiency disease 03/31/2018   Persistent proteinuria 03/31/2018   Hyperlipidemia LDL goal <100 03/30/2018   Chemotherapy-induced peripheral neuropathy (HCC) 12/09/2016   Asthma in adult, mild persistent, uncomplicated  01/19/2016   Hypertensive heart disease 09/26/2015   Depression with somatization 06/15/2015   Hypertriglyceridemia without hypercholesterolemia 06/15/2015   Hypokalemia 06/15/2015   Insomnia secondary to anxiety 04/05/2015   Idiopathic intracranial hypertension 10/21/2014   Upper airway cough syndrome 10/09/2014   Malignant hypertension 10/09/2014   Morbid obesity due to excess calories (HCC) 10/09/2014   Apnea, sleep 05/04/2014   Acute on chronic diastolic CHF (congestive heart failure), NYHA class 2 (HCC) 05/03/2014   DOE (dyspnea on exertion) 07/14/2013   Acute on chronic diastolic heart failure (HCC) 06/23/2013   Malignant neoplasm of upper-outer quadrant of left breast in female, estrogen receptor negative (HCC) 01/07/2013   Gastroparesis 01/04/2013   Resistant hypertension 09/17/2011   Axillary hidradenitis suppurativa  08/01/2011   Preventative health care 04/13/2011   Hyperglycemia 04/13/2011   GERD (gastroesophageal reflux disease) 03/18/2011   Cough variant asthma 09/27/2010    PCP: Knute Thersia Bitters, FNP  REFERRING PROVIDER: Joane Artist RAMAN, MD  REFERRING DIAG: Lumbar radiculopathy  Rationale for Evaluation and Treatment: Rehabilitation  THERAPY DIAG:  Other low back pain  Pain in right leg  Muscle weakness (generalized)  ONSET DATE: August 2024   SUBJECTIVE:     SUBJECTIVE STATEMENT: Patient reports she was walking and the right leg gave out on her, and she fell on a chair. She was able to go to church again. She states she is doing alright today, but her lower back was irritated over the weekend. She does report that her right shin is sore to touch and hurts.  Eval: Patients reports she had renal denervation done in August 2024 and has been in pain ever since. Pain is located across the lower back, she was told it would hurt for 3-4 months but pain persisted in the lower back. The right knee pain started after in October or November, and it will keep her  up at night. She states it feels heavy in her lower back, like labor pain that never stops. The pain will start in the right lower back region and then will feel like lightning shooting down the right leg to the back and outside of the right knee, pain does not go past the knee. She leans on the bed for relief, and she states getting on her stomach gives her the most relief, but she has a lot of pain when getting up. She does use a heating pad as well. She reports that to help with the knee pain at night she has to have her leg up on the wall. She states she moves around a lot because sitting or standing for long periods will make it worse. Will occasionally have trouble bending down and she states the right leg has given out on her causing her to fall. She does have trouble going up/down stairs, she turns to the side when going down, and she also has breathing issues so when she goes up stairs to get inside she has to immediately get on oxygen. She reports inability to play with or lift her grandchildren and difficulty completing activities around her home for her son.  PERTINENT HISTORY:  See PMH above  PAIN:  Are you having pain? Yes:  NPRS scale: 7/10 currently, 10/10 at worst Pain location: Lower back and right leg/knee Pain description: Heavy, labor pains, constant Aggravating factors: Standing, sitting, activity Relieving factors: Lying on stomach, leaning on bed or counter, heat  PRECAUTIONS: Fall, patient reports she was told not to lift more than 10 lbs  FALLS:  Has patient fallen in last 6 months? Yes. Number of falls 3-4, right knee gives out or sharp pains could cause her to fall  PATIENT GOALS: Pain relief, get back to doing normal things   OBJECTIVE:  Note: Objective measures were completed at Evaluation unless otherwise noted. PATIENT SURVEYS:  Modified Oswestry 37/50 (74% disability)   08/29/2023: 33/50 (66%)  10/06/2023: 31/50 (62%)  SENSATION: Patient reports neuropathy  of feet and hands  MUSCLE LENGTH: Unable to fully assess, severe increase in right lower back and leg pain when attempting to assess hamstring length  POSTURE:  Rounded shoulder posture, normal lumbar lordosis, weight shift toward left and patient frequently changing positions while seated for comfort  PALPATION: Exquisite tenderness to  right lower lumbar paraspinals, right gluteal and TFL region, right lateral thigh and knee; moderate tenderness to left lumbar paraspinals  Unable to assess lumbar mobility  LUMBAR ROM:   AROM eval  Flexion   Extension   Right lateral flexion   Left lateral flexion   Right rotation   Left rotation    (Blank rows = not tested)  LOWER EXTREMITY ROM:    Limitations in right hip ROM due to pain and guarding, right knee flexion limited due to pain  LOWER EXTREMITY MMT:    MMT Right eval Left eval Rt / Lt 07/08/2023 Right 07/22/2023 Right 07/31/2023 Rt / Lt 08/06/2023 Rt / Lt 08/29/2023  Hip flexion 3+ 4       Hip extension 2 3- 3 / 3   3 / 3+ 3 / 3+  Hip abduction 2     3 / 3+ 3 / 3+  Hip adduction         Hip internal rotation         Hip external rotation         Knee flexion 4- 4  4     Knee extension 4- 4+  4 4 4 4  / 5  Ankle dorsiflexion         Ankle plantarflexion         Ankle inversion         Ankle eversion          (Blank rows = not tested)  LUMBAR SPECIAL TESTS:  SLR positive on right  FUNCTIONAL TESTS:  Sit to stand: patient will difficulty standing from standard chair, requires UE for support with transferring from sit to stand 08/06/2023: patient able to stand from standard chair without support  GAIT: Assistive device utilized: None Level of assistance: Complete Independence Comments: Antalgic on right   TREATMENT [facility] Adult PT Treatment:                                                DATE: 10/06/2023 Recumbent bike L3 x 5 min to improve endurance and workload capacity Seated pelvic tilt 2 x 10 Sit to stand 3 x  10 Deadlift from 8 box with 20# 3 x 6 Seated lumbar flexion stability ball roll out stretch 5 x 10 sec Seated lumbar extension holding dowel with black band 2 x 10 x 5 sec Standing heel toe raise 3 x 10  PATIENT EDUCATION:  Education details: HEP update Person educated: Patient Education method: Explanation, Demonstration, Tactile cues, Verbal cues, Handout Education comprehension: verbalized understanding, returned demonstration, verbal cues required, tactile cues required, and needs further education  HOME EXERCISE PROGRAM: Access Code: [email or web address]    ASSESSMENT: CLINICAL IMPRESSION: Patient tolerated therapy well with no adverse effects. Therapy continues to focus on progressing her core, hip, and LE strengthening. She does report improvement in functional status on modified ODI this visit. She was able to progress with her lifting this visit but does report increase in right knee and lower back pain. She did report some right shin pain so incorporated some lower leg strengthening with good tolerance. Updated her HEP to progress lower leg strengthening for home. Patient would benefit from continued skilled PT to progress mobility and strength in order to reduce pain and maximize functional ability.   Eval: Patient is a 48 y.o. female who was seen today  for physical therapy evaluation and treatment for chronic lower back and right knee pain. Her symptoms do seem consistent with nonspecific lower back pain and right sided lumbar radiculopathy. Evaluation was limited this visit due to patient's high symptom severity and irritability level. She did become tearful during the evaluation. Trial of e-stim IFC combined with cold pack with patient reporting pain level reduction, and provided patient with information for purchasing home TENS unit for pain relief. Unable to provide patient with HEP this visit due to inability to complete exercise without severe pain. Patient was provided extensive  education on possible etiologies of her symptoms and treatments that could be utilized in therapy, and gradual progression for return to activity.   OBJECTIVE IMPAIRMENTS: Abnormal gait, decreased activity tolerance, decreased balance, decreased ROM, decreased strength, impaired flexibility, postural dysfunction, and pain.   ACTIVITY LIMITATIONS: carrying, lifting, bending, sitting, standing, squatting, sleeping, stairs, transfers, bed mobility, locomotion level, and caring for others  PARTICIPATION LIMITATIONS: meal prep, cleaning, laundry, driving, shopping, and community activity  PERSONAL FACTORS: Fitness, Past/current experiences, Time since onset of injury/illness/exacerbation, and 3+ comorbidities: see PMH above are also affecting patient's functional outcome.    GOALS: Goals reviewed with patient? Yes  SHORT TERM GOALS: Target date: 09/26/2023  Patient will be I with initial HEP in order to progress with therapy. Baseline: None provided at eval 07/28/2023: patient reports independence with HEP Goal status: MET  2.  Patient will report low back and right leg pain </= 7/10 at worst to reduce functional limitations Baseline: 10/10 pain at worst 07/29/2023: reports pain at worst still 10/10 08/06/2023: continues to report pain 10/10 at worst 08/29/2023: continues to report pain 10/10 at worst 09/16/2023: continues to report pain 10/10 at worst Goal status: ONGOING  3.  Patient will be able to perform sit to stand without difficulty or using UE for support to improve mobility and indicate improvement in LE strength Baseline: requires UE for support with standing from chair 07/29/2023: able to perform sit to stand without UE support but exhibits difficulty  08/06/2023: able to perform sit to stand without UE support but exhibits difficulty due to knee pain 08/29/2023: patient can perform sit to stand without difficulty Goal status: MET  LONG TERM GOALS: Target date: 10/24/2023  Patient  will be I with final HEP to maintain progress from PT. Baseline: None provided at eval 08/06/2023: progressing 08/29/2023: progressing Goal status: ONGOING  2.  Patient will report Modified Oswestry </= 25/50 (50% disability) in order to indicate an improvement in their functional status Baseline: 37/50 (74% disability) 08/06/2023: not assessed 08/29/2023: 33/50 (66%) 10/06/2023: 31/50 (62%) Goal status: ONGOING  3.  Patient will demonstrate improvement in core and hip strength grossly >/= 4-/5 MMT in order to improve her activity tolerance with household tasks Baseline: see limitations above 08/06/2023: see limitations above 08/29/2023: see limitations above Goal status: ONGOING  4.  Patient will demonstrate right knee strength >/= 4+/5 MMT in order to improve stair negotiation and reduce fall risk due to right knee giving out Baseline: see limitations above 08/06/2023: see limitations above 08/29/2023: see limitations above Goal status: ONGOING  5. Patient will report lower back and right leg pain </= 4/10 in order to reduce functional limitations  Baseline: 10/10 at worst  08/06/2023: continues to report pain 10/10 at worst 08/29/2023: continues to report pain 10/10 at worst 09/16/2023: continues to report pain 10/10 at worst  Goal status: ONGOING   PLAN: PT FREQUENCY: 1-2x/week  PT DURATION: 8 weeks  PLANNED INTERVENTIONS: 97164- PT Re-evaluation, 97110-Therapeutic exercises, 97530- Therapeutic activity, [id number]- Neuromuscular re-education, 97535- Self Care, [id number]- Manual therapy, [id number]- Electrical stimulation (unattended), [phone]- Electrical stimulation (manual), Patient/Family education, Balance training, Taping, Dry Needling, Joint mobilization, Joint manipulation, Spinal manipulation, Spinal mobilization, Cryotherapy, and Moist heat.  PLAN FOR NEXT SESSION: Review HEP and progress PRN, continue with modalities for pain relief as needed, manual/TPDN for right lumbar and gluteal region, LAD,  gentle stretching and mobility for lumbar, gentle core activation, hip and LE strengthening as tolerated    Elaine Daring, PT, DPT, LAT, ATC 10/06/23  11:03 AM Phone: [phone] Fax: [phone]

## 2023-10-13 ENCOUNTER — Other Ambulatory Visit: Payer: Self-pay

## 2023-10-13 ENCOUNTER — Ambulatory Visit (INDEPENDENT_AMBULATORY_CARE_PROVIDER_SITE_OTHER): Admitting: Family Medicine

## 2023-10-13 ENCOUNTER — Ambulatory Visit (INDEPENDENT_AMBULATORY_CARE_PROVIDER_SITE_OTHER)

## 2023-10-13 VITALS — BP 198/102 | HR 108 | Ht 66.0 in | Wt 220.0 lb

## 2023-10-13 DIAGNOSIS — M79671 Pain in right foot: Secondary | ICD-10-CM

## 2023-10-13 NOTE — Progress Notes (Signed)
 LILLETTE Ileana Collet, PhD, LAT, ATC acting as a scribe for [name], MD.  Erica [name] is a 48 y.o. female who presents to [facility] at [facility] today for R foot pain. Pt was previously seen by Dr. Lloyd on 07/01/23 for R knee pain and lumbar radiculopathy.  Today, pt c/o R foot pain ongoing for about a wk. She had an almost fall last Sunday, but is unsure if that is the cause. Pt locates pain to along the dorsum of the foot and into the anterior lower leg.   Treatments tried: none  Pertinent review of systems: No fevers or chills  Relevant historical information: Hypertension.  Of note patient used to have a cam walker boot.  It was lost during a endovascular procedure in August 2024 at the hospital.  This was for her renal denervation.  She wore the boot into the procedure suite and no one could find it afterwards.  It has not been replaced.   Exam:  BP (!) 198/102   Pulse (!) 108   Ht 5' 6 (1.676 m)   Wt 220 lb (99.8 kg)   LMP  (LMP Unknown)   SpO2 98%   BMI 35.51 kg/m  General: Well Developed, well nourished, and in no acute distress.   MSK: Right foot and ankle some swelling across dorsal lateral midfoot.  Tender palpation at ATFL region.  Normal foot and ankle motion.  Stable ligamentous exam.  Intact strength.    Lab and Radiology Results  Diagnostic Limited MSK Ultrasound of: Right foot and ankle Minimal joint effusion at lateral ankle.  No significant joint effusion at midfoot.  No visible fracture. Impression: Mild swelling and joint effusion  X-ray images right foot and ankle obtained today personally and independently interpreted.  Right ankle: No acute fractures.  Mild ankle DJD is present.  No severe arthritis.  Right foot: No acute fractures present in midfoot.  She does have an accessory ossicle lateral to the cuboid.  However this is rounded and appears to be chronic or old.  Await formal radiology review   Assessment and  Plan: 48 y.o. female with right foot and ankle pain without obvious injury.  This could be an ankle sprain or overuse or exacerbation of arthritis.  Plan for CAM Walker boot and recheck in about a month with my partner Dr. Leonce. Of note she used to have an ankle cam walker boot but it was lost during her procedure at [facility] health facility about a year ago.  We are going to dispense a boot today through her insurance but the cost of this should be covered by the hospital in my opinion.   PDMP not reviewed this encounter. Orders Placed This Encounter  Procedures   US  LIMITED JOINT SPACE STRUCTURES LOW RIGHT(NO LINKED CHARGES)    Reason for Exam (SYMPTOM  OR DIAGNOSIS REQUIRED):   right foot pain    Preferred imaging location?:   [facility] Ankle Complete Right    Standing Status:   Future    Number of Occurrences:   1    Expiration Date:   10/12/2024    Reason for Exam (SYMPTOM  OR DIAGNOSIS REQUIRED):   right foot pain    Preferred imaging location?:   [facility]    Is patient pregnant?:   No   DG Foot Complete Right    Standing Status:   Future    Number of Occurrences:  1    Expiration Date:   10/12/2024    Reason for Exam (SYMPTOM  OR DIAGNOSIS REQUIRED):   right foot pain    Preferred imaging location?:   [facility]    Is patient pregnant?:   No   No orders of the defined types were placed in this encounter.    Discussed warning signs or symptoms. Please see discharge instructions. Patient expresses understanding.   The above documentation has been reviewed and is accurate and complete [name], M.D.

## 2023-10-13 NOTE — Patient Instructions (Addendum)
 Thank you for coming in today.   Please get an Xray today before you leave   Wear the boot  Follow up with Dr. Leonce in 1 month  Reminder: Dr. Joane will be out of the office starting August 1st for about 6 weeks

## 2023-10-14 ENCOUNTER — Ambulatory Visit: Payer: Self-pay | Admitting: Family Medicine

## 2023-10-14 NOTE — Progress Notes (Signed)
Right foot x-ray looks normal to radiology

## 2023-10-14 NOTE — Progress Notes (Signed)
 Right ankle x-ray shows stable appearing arthritis.

## 2023-10-15 ENCOUNTER — Encounter: Admitting: Physical Therapy

## 2023-10-15 NOTE — Therapy (Incomplete)
 OUTPATIENT PHYSICAL THERAPY TREATMENT    Patient Name: Erica [name] MRN: [id number] DOB:July 01, 1975, 48 y.o., female Today's Date: 10/15/2023   END OF SESSION:                            Past Medical History:  Diagnosis Date   Allergy     Anxiety    Apnea, sleep 05/04/2014   AHI 31/hr now on CPAP at 12cm H2O   Arthritis    Asthma    Asthma without status asthmaticus 01/19/2016   Axillary hidradenitis suppurativa 08/01/2011   Blood transfusion without reported diagnosis    Breast cancer (HCC) 2010   a. locally advanced left breast carcinoma diagnosed in 2010 and treated with neoadjuvant chemotherapy with docetaxel and cyclophosphamide as well as paclitaxel. This was followed by radiation therapy which was completed in 2011.   Chemotherapy-induced peripheral neuropathy (HCC) 12/09/2016   CHF (congestive heart failure) (HCC)    Chronic diastolic CHF (congestive heart failure) (HCC) 06/23/2013   Chronic kidney disease    Chronic pain in left foot 06/29/2018   Chronic stable angina (HCC) 08/22/2020   Cough variant asthma 09/27/2010   Followed in Pulmonary clinic/ [facility]/ [place]  Onset reported in childhood  - PFT's 01/31/2011 FEV1  1.76 (60% ) ratio 68 and no better p B2,  DLCO 70% -med review 09/25/2012 > [place]  drug assistance  paperwork given    - HFA 90% 11/20/2012 .  -Med calendar 12/07/12 , 04/06/2013 , 04/18/2014,, 08/30/2014 , 11/08/2014  - PFT's 01/12/2013  1.73 (65%)  Ratio 73 and no better p B2, DLCO 86% - trial off    Depression with somatization 06/15/2015   Dyspnea on exertion    a. 01/2013 Lexi MV: EF 54%, no ischemia/infarct;  b. 05/2013 Echo: EF 60-65%, no rwma, Gr 2 DD;  b.    Essential hypertension 10/09/2014   Estimated Creatinine Clearance: 82.4 mL/min (by C-G formula based on SCr of 0.98 mg/dL).   Fatty liver    Gastroparesis    GERD (gastroesophageal reflux disease)    Hyperglycemia 04/13/2011   Hypertension    Hypertensive heart  disease 09/26/2015   Hypertriglyceridemia without hypercholesterolemia 06/15/2015   Framingham risk score is 1%    Idiopathic intracranial hypertension 10/21/2014   Impaired glucose tolerance 04/13/2011   Insomnia secondary to anxiety 04/05/2015   Lymphadenitis, chronic    restricted LEFT extremity   Malignant neoplasm of upper-outer quadrant of left breast in female, estrogen receptor negative (HCC) 01/07/2013   Stage III, Invasive Ductal, s/p partial Left Mastectomy/Lumpectomy (baseball sized) with Lymph Node dissection, had Chemo and RadRx     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   OSA (obstructive sleep apnea) 05/04/2014   AHI 31/hr now on CPAP at 12cm H2O   Oxygen deficiency    Uses 02 in CPAP   Persistent proteinuria 03/31/2018   Personal history of chemotherapy 2010   Personal history of radiation therapy 2010   Resistant hypertension 09/17/2011       Sleep apnea    SVT (supraventricular tachycardia) (HCC) 08/22/2020   Tibialis posterior tendinitis, left 07/15/2018   Injected in March 10, 2019   Ulcer    Upper airway cough syndrome 10/09/2014   Onset around 2012 with assoc pseudowheeze MRI 05/18/15 > Paranasal sinuses are clear - 11/17/2015 rec increase h1 to 2 hs to help with noct cough  - Allergy  profile 11/17/2015 >  Eos 0.1 /  IgE  80 POS RAST  to grass, ragweed, cedar tree  - d/c acei 03/06/2018  - flare end of Jan 2020 in setting of ? Uri/ poorly controlled GERD - flare mid Oct 2020 with uri / assoc secondary gerd   Vitamin D  deficiency disease 03/31/2018   Past Surgical History:  Procedure Laterality Date   BRAIN SURGERY     Shunt   BREAST LUMPECTOMY Left 09/2008   CARPAL TUNNEL RELEASE Left 2011   LYMPH NODE DISSECTION Left 2010   breast; 2 wks after breast lumpectomy   RENAL DENERVATION N/A 11/20/2022   Procedure: RENAL DENERVATION;  Surgeon: Darron Deatrice LABOR, MD;  Location: [facility] INVASIVE CV LAB;  Service: Cardiovascular;  Laterality: N/A;   RIGHT/LEFT HEART CATH  AND CORONARY ANGIOGRAPHY N/A 05/04/2019   Procedure: RIGHT/LEFT HEART CATH AND CORONARY ANGIOGRAPHY;  Surgeon: Cherrie Toribio SAUNDERS, MD;  Location: [facility] INVASIVE CV LAB;  Service: Cardiovascular;  Laterality: N/A;  Renal injection preformed    RIGHT/LEFT HEART CATH AND CORONARY ANGIOGRAPHY N/A 10/25/2020   Procedure: RIGHT/LEFT HEART CATH AND CORONARY ANGIOGRAPHY;  Surgeon: Anner Alm ORN, MD;  Location[facility] INVASIVE CV LAB;  Service: Cardiovascular;  Laterality: N/A;   TENDON RELEASE Right 2012   hand   TUBAL LIGATION  05/11/2011   Procedure: POST PARTUM TUBAL LIGATION;  Surgeon: Lynwood Solomons, MD;  Location: [facility] ORS;  Service: Gynecology;  Laterality: Bilateral;  Induced for HTN   Patient Active Problem List   Diagnosis Date Noted   Prediabetes 06/13/2023   Moderate episode of recurrent major depressive disorder (HCC) 02/04/2023   GAD (generalized anxiety disorder) 02/04/2023   Chronic respiratory failure with hypoxia (HCC) 12/31/2021   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   Coronary artery disease    Chronic stable angina (HCC) 08/22/2020   SVT (supraventricular tachycardia) (HCC) 08/22/2020   Palpitations 02/28/2020   Right ankle pain 04/14/2019   OSA (obstructive sleep apnea) 10/01/2018   Tibialis posterior tendinitis, left 07/15/2018   Chronic pain in left foot 06/29/2018   Vitamin D  deficiency disease 03/31/2018   Persistent proteinuria 03/31/2018   Hyperlipidemia LDL goal <100 03/30/2018   Chemotherapy-induced peripheral neuropathy (HCC) 12/09/2016   Asthma in adult, mild persistent, uncomplicated 01/19/2016   Hypertensive heart disease 09/26/2015   Depression with somatization 06/15/2015   Hypertriglyceridemia without hypercholesterolemia 06/15/2015   Hypokalemia 06/15/2015   Insomnia secondary to anxiety 04/05/2015   Idiopathic intracranial hypertension 10/21/2014   Upper airway cough syndrome 10/09/2014   Malignant hypertension 10/09/2014   Morbid obesity due to excess calories (HCC)  10/09/2014   Apnea, sleep 05/04/2014   Acute on chronic diastolic CHF (congestive heart failure), NYHA class 2 (HCC) 05/03/2014   DOE (dyspnea on exertion) 07/14/2013   Acute on chronic diastolic heart failure (HCC) 06/23/2013   Malignant neoplasm of upper-outer quadrant of left breast in female, estrogen receptor negative (HCC) 01/07/2013   Gastroparesis 01/04/2013   Resistant hypertension 09/17/2011   Axillary hidradenitis suppurativa 08/01/2011   Preventative health care 04/13/2011   Hyperglycemia 04/13/2011   GERD (gastroesophageal reflux disease) 03/18/2011   Cough variant asthma 09/27/2010    PCP: Knute Thersia Bitters, FNP  REFERRING PROVIDER: Joane Artist RAMAN, MD  REFERRING DIAG: Lumbar radiculopathy  Rationale for Evaluation and Treatment: Rehabilitation  THERAPY DIAG:  No diagnosis found.  ONSET DATE: August 2024   SUBJECTIVE:     SUBJECTIVE STATEMENT: Patient reports she was walking and the right leg gave out on her, and she fell on a chair. She was able to go  to church again. She states she is doing alright today, but her lower back was irritated over the weekend. She does report that her right shin is sore to touch and hurts.  Eval: Patients reports she had renal denervation done in August 2024 and has been in pain ever since. Pain is located across the lower back, she was told it would hurt for 3-4 months but pain persisted in the lower back. The right knee pain started after in October or November, and it will keep her up at night. She states it feels heavy in her lower back, like labor pain that never stops. The pain will start in the right lower back region and then will feel like lightning shooting down the right leg to the back and outside of the right knee, pain does not go past the knee. She leans on the bed for relief, and she states getting on her stomach gives her the most relief, but she has a lot of pain when getting up. She does use a heating pad as well. She  reports that to help with the knee pain at night she has to have her leg up on the wall. She states she moves around a lot because sitting or standing for long periods will make it worse. Will occasionally have trouble bending down and she states the right leg has given out on her causing her to fall. She does have trouble going up/down stairs, she turns to the side when going down, and she also has breathing issues so when she goes up stairs to get inside she has to immediately get on oxygen. She reports inability to play with or lift her grandchildren and difficulty completing activities around her home for her son.  PERTINENT HISTORY:  See PMH above  PAIN:  Are you having pain? Yes:  NPRS scale: 7/10 currently, 10/10 at worst Pain location: Lower back and right leg/knee Pain description: Heavy, labor pains, constant Aggravating factors: Standing, sitting, activity Relieving factors: Lying on stomach, leaning on bed or counter, heat  PRECAUTIONS: Fall, patient reports she was told not to lift more than 10 lbs  FALLS:  Has patient fallen in last 6 months? Yes. Number of falls 3-4, right knee gives out or sharp pains could cause her to fall  PATIENT GOALS: Pain relief, get back to doing normal things   OBJECTIVE:  Note: Objective measures were completed at Evaluation unless otherwise noted. PATIENT SURVEYS:  Modified Oswestry 37/50 (74% disability)   08/29/2023: 33/50 (66%)  10/06/2023: 31/50 (62%)  SENSATION: Patient reports neuropathy of feet and hands  MUSCLE LENGTH: Unable to fully assess, severe increase in right lower back and leg pain when attempting to assess hamstring length  POSTURE:  Rounded shoulder posture, normal lumbar lordosis, weight shift toward left and patient frequently changing positions while seated for comfort  PALPATION: Exquisite tenderness to right lower lumbar paraspinals, right gluteal and TFL region, right lateral thigh and knee; moderate tenderness to  left lumbar paraspinals  Unable to assess lumbar mobility  LUMBAR ROM:   AROM eval  Flexion   Extension   Right lateral flexion   Left lateral flexion   Right rotation   Left rotation    (Blank rows = not tested)  LOWER EXTREMITY ROM:    Limitations in right hip ROM due to pain and guarding, right knee flexion limited due to pain  LOWER EXTREMITY MMT:    MMT Right eval Left eval Rt / Lt 07/08/2023 Right 07/22/2023  Right 07/31/2023 Rt / Lt 08/06/2023 Rt / Lt 08/29/2023  Hip flexion 3+ 4       Hip extension 2 3- 3 / 3   3 / 3+ 3 / 3+  Hip abduction 2     3 / 3+ 3 / 3+  Hip adduction         Hip internal rotation         Hip external rotation         Knee flexion 4- 4  4     Knee extension 4- 4+  4 4 4 4  / 5  Ankle dorsiflexion         Ankle plantarflexion         Ankle inversion         Ankle eversion          (Blank rows = not tested)  LUMBAR SPECIAL TESTS:  SLR positive on right  FUNCTIONAL TESTS:  Sit to stand: patient will difficulty standing from standard chair, requires UE for support with transferring from sit to stand 08/06/2023: patient able to stand from standard chair without support  GAIT: Assistive device utilized: None Level of assistance: Complete Independence Comments: Antalgic on right   TREATMENT [facility] Adult PT Treatment:                                                DATE: 10/15/2023 Recumbent bike L3 x 5 min to improve endurance and workload capacity Seated pelvic tilt 2 x 10 Sit to stand 3 x 10 Deadlift from 8 box with 20# 3 x 6 Seated lumbar flexion stability ball roll out stretch 5 x 10 sec Seated lumbar extension holding dowel with black band 2 x 10 x 5 sec Standing heel toe raise 3 x 10  PATIENT EDUCATION:  Education details: HEP update Person educated: Patient Education method: Explanation, Demonstration, Tactile cues, Verbal cues, Handout Education comprehension: verbalized understanding, returned demonstration, verbal cues required,  tactile cues required, and needs further education  HOME EXERCISE PROGRAM: Access Code: [email or web address]    ASSESSMENT: CLINICAL IMPRESSION: Patient tolerated therapy well with no adverse effects. *** Patient would benefit from continued skilled PT to progress mobility and strength in order to reduce pain and maximize functional ability.  Therapy continues to focus on progressing her core, hip, and LE strengthening. She does report improvement in functional status on modified ODI this visit. She was able to progress with her lifting this visit but does report increase in right knee and lower back pain. She did report some right shin pain so incorporated some lower leg strengthening with good tolerance. Updated her HEP to progress lower leg strengthening for home.    Eval: Patient is a 48 y.o. female who was seen today for physical therapy evaluation and treatment for chronic lower back and right knee pain. Her symptoms do seem consistent with nonspecific lower back pain and right sided lumbar radiculopathy. Evaluation was limited this visit due to patient's high symptom severity and irritability level. She did become tearful during the evaluation. Trial of e-stim IFC combined with cold pack with patient reporting pain level reduction, and provided patient with information for purchasing home TENS unit for pain relief. Unable to provide patient with HEP this visit due to inability to complete exercise without severe pain. Patient was provided extensive education on possible etiologies of  her symptoms and treatments that could be utilized in therapy, and gradual progression for return to activity.   OBJECTIVE IMPAIRMENTS: Abnormal gait, decreased activity tolerance, decreased balance, decreased ROM, decreased strength, impaired flexibility, postural dysfunction, and pain.   ACTIVITY LIMITATIONS: carrying, lifting, bending, sitting, standing, squatting, sleeping, stairs, transfers, bed mobility, locomotion  level, and caring for others  PARTICIPATION LIMITATIONS: meal prep, cleaning, laundry, driving, shopping, and community activity  PERSONAL FACTORS: Fitness, Past/current experiences, Time since onset of injury/illness/exacerbation, and 3+ comorbidities: see PMH above are also affecting patient's functional outcome.    GOALS: Goals reviewed with patient? Yes  SHORT TERM GOALS: Target date: 09/26/2023  Patient will be I with initial HEP in order to progress with therapy. Baseline: None provided at eval 07/28/2023: patient reports independence with HEP Goal status: MET  2.  Patient will report low back and right leg pain </= 7/10 at worst to reduce functional limitations Baseline: 10/10 pain at worst 07/29/2023: reports pain at worst still 10/10 08/06/2023: continues to report pain 10/10 at worst 08/29/2023: continues to report pain 10/10 at worst 09/16/2023: continues to report pain 10/10 at worst Goal status: ONGOING  3.  Patient will be able to perform sit to stand without difficulty or using UE for support to improve mobility and indicate improvement in LE strength Baseline: requires UE for support with standing from chair 07/29/2023: able to perform sit to stand without UE support but exhibits difficulty  08/06/2023: able to perform sit to stand without UE support but exhibits difficulty due to knee pain 08/29/2023: patient can perform sit to stand without difficulty Goal status: MET  LONG TERM GOALS: Target date: 10/24/2023  Patient will be I with final HEP to maintain progress from PT. Baseline: None provided at eval 08/06/2023: progressing 08/29/2023: progressing Goal status: ONGOING  2.  Patient will report Modified Oswestry </= 25/50 (50% disability) in order to indicate an improvement in their functional status Baseline: 37/50 (74% disability) 08/06/2023: not assessed 08/29/2023: 33/50 (66%) 10/06/2023: 31/50 (62%) Goal status: ONGOING  3.  Patient will demonstrate improvement in  core and hip strength grossly >/= 4-/5 MMT in order to improve her activity tolerance with household tasks Baseline: see limitations above 08/06/2023: see limitations above 08/29/2023: see limitations above Goal status: ONGOING  4.  Patient will demonstrate right knee strength >/= 4+/5 MMT in order to improve stair negotiation and reduce fall risk due to right knee giving out Baseline: see limitations above 08/06/2023: see limitations above 08/29/2023: see limitations above Goal status: ONGOING  5. Patient will report lower back and right leg pain </= 4/10 in order to reduce functional limitations  Baseline: 10/10 at worst  08/06/2023: continues to report pain 10/10 at worst 08/29/2023: continues to report pain 10/10 at worst 09/16/2023: continues to report pain 10/10 at worst  Goal status: ONGOING   PLAN: PT FREQUENCY: 1-2x/week  PT DURATION: 8 weeks  PLANNED INTERVENTIONS: 97164- PT Re-evaluation, 97110-Therapeutic exercises, 97530- Therapeutic activity, 97112- Neuromuscular re-education, 97535- Self Care, [id number]- Manual therapy, [id number]- Electrical stimulation (unattended), [id number]- Electrical stimulation (manual), Patient/Family education, Balance training, Taping, Dry Needling, Joint mobilization, Joint manipulation, Spinal manipulation, Spinal mobilization, Cryotherapy, and Moist heat.  PLAN FOR NEXT SESSION: Review HEP and progress PRN, continue with modalities for pain relief as needed, manual/TPDN for right lumbar and gluteal region, LAD, gentle stretching and mobility for lumbar, gentle core activation, hip and LE strengthening as tolerated    Elaine Daring, PT, DPT, LAT, ATC 10/15/23  9:04 AM Phone: [phone]  Fax: [phone]

## 2023-10-17 ENCOUNTER — Encounter: Payer: Self-pay | Admitting: Physical Therapy

## 2023-10-17 ENCOUNTER — Ambulatory Visit: Admitting: Physical Therapy

## 2023-10-17 ENCOUNTER — Other Ambulatory Visit: Payer: Self-pay

## 2023-10-17 DIAGNOSIS — M79604 Pain in right leg: Secondary | ICD-10-CM | POA: Diagnosis not present

## 2023-10-17 DIAGNOSIS — M5459 Other low back pain: Secondary | ICD-10-CM

## 2023-10-17 DIAGNOSIS — M6281 Muscle weakness (generalized): Secondary | ICD-10-CM | POA: Diagnosis not present

## 2023-10-17 NOTE — Therapy (Signed)
 OUTPATIENT PHYSICAL THERAPY TREATMENT    Patient Name: Erica [name] MRN: [id number] DOB:04-11-1975, 48 y.o., female Today's Date: 10/17/2023   END OF SESSION:  PT End of Session - 10/17/23 1015     Visit Number 26    Number of Visits 34    Date for PT Re-Evaluation 10/24/23    Authorization Type [employer] MCR Dual Complete    Progress Note Due on Visit 30    PT Start Time 1015    PT Stop Time 1055    PT Time Calculation (min) 40 min    Activity Tolerance Patient tolerated treatment well    Behavior During Therapy WFL for tasks assessed/performed                                   Past Medical History:  Diagnosis Date   Allergy     Anxiety    Apnea, sleep 05/04/2014   AHI 31/hr now on CPAP at 12cm H2O   Arthritis    Asthma    Asthma without status asthmaticus 01/19/2016   Axillary hidradenitis suppurativa 08/01/2011   Blood transfusion without reported diagnosis    Breast cancer (HCC) 2010   a. locally advanced left breast carcinoma diagnosed in 2010 and treated with neoadjuvant chemotherapy with docetaxel and cyclophosphamide as well as paclitaxel. This was followed by radiation therapy which was completed in 2011.   Chemotherapy-induced peripheral neuropathy (HCC) 12/09/2016   CHF (congestive heart failure) (HCC)    Chronic diastolic CHF (congestive heart failure) (HCC) 06/23/2013   Chronic kidney disease    Chronic pain in left foot 06/29/2018   Chronic stable angina (HCC) 08/22/2020   Cough variant asthma 09/27/2010   Followed in Pulmonary clinic/ [facility]/ [facility]  Onset reported in childhood  - PFT's 01/31/2011 FEV1  1.76 (60% ) ratio 68 and no better p B2,  DLCO 70% -med review 09/25/2012 > [place]  drug assistance  paperwork given    - HFA 90% 11/20/2012 .  -Med calendar 12/07/12 , 04/06/2013 , 04/18/2014,, 08/30/2014 , 11/08/2014  - PFT's 01/12/2013  1.73 (65%)  Ratio 73 and no better p B2, DLCO 86% - trial off    Depression with somatization  06/15/2015   Dyspnea on exertion    a. 01/2013 Lexi MV: EF 54%, no ischemia/infarct;  b. 05/2013 Echo: EF 60-65%, no rwma, Gr 2 DD;  b.    Essential hypertension 10/09/2014   Estimated Creatinine Clearance: 82.4 mL/min (by C-G formula based on SCr of 0.98 mg/dL).   Fatty liver    Gastroparesis    GERD (gastroesophageal reflux disease)    Hyperglycemia 04/13/2011   Hypertension    Hypertensive heart disease 09/26/2015   Hypertriglyceridemia without hypercholesterolemia 06/15/2015   Framingham risk score is 1%    Idiopathic intracranial hypertension 10/21/2014   Impaired glucose tolerance 04/13/2011   Insomnia secondary to anxiety 04/05/2015   Lymphadenitis, chronic    restricted LEFT extremity   Malignant neoplasm of upper-outer quadrant of left breast in female, estrogen receptor negative (HCC) 01/07/2013   Stage III, Invasive Ductal, s/p partial Left Mastectomy/Lumpectomy (baseball sized) with Lymph Node dissection, had Chemo and RadRx     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   OSA (obstructive sleep apnea) 05/04/2014   AHI 31/hr now on CPAP at 12cm H2O   Oxygen deficiency    Uses 02 in CPAP   Persistent proteinuria 03/31/2018   Personal history of  chemotherapy 2010   Personal history of radiation therapy 2010   Resistant hypertension 09/17/2011       Sleep apnea    SVT (supraventricular tachycardia) (HCC) 08/22/2020   Tibialis posterior tendinitis, left 07/15/2018   Injected in March 10, 2019   Ulcer    Upper airway cough syndrome 10/09/2014   Onset around 2012 with assoc pseudowheeze MRI 05/18/15 > Paranasal sinuses are clear - 11/17/2015 rec increase h1 to 2 hs to help with noct cough  - Allergy  profile 11/17/2015 >  Eos 0.1 /  IgE  80 POS RAST  to grass, ragweed, cedar tree  - d/c acei 03/06/2018  - flare end of Jan 2020 in setting of ? Uri/ poorly controlled GERD - flare mid Oct 2020 with uri / assoc secondary gerd   Vitamin D  deficiency disease 03/31/2018   Past Surgical  History:  Procedure Laterality Date   BRAIN SURGERY     Shunt   BREAST LUMPECTOMY Left 09/2008   CARPAL TUNNEL RELEASE Left 2011   LYMPH NODE DISSECTION Left 2010   breast; 2 wks after breast lumpectomy   RENAL DENERVATION N/A 11/20/2022   Procedure: RENAL DENERVATION;  Surgeon: Darron Deatrice LABOR, MD;  Location: [facility] INVASIVE CV LAB;  Service: Cardiovascular;  Laterality: N/A;   RIGHT/LEFT HEART CATH AND CORONARY ANGIOGRAPHY N/A 05/04/2019   Procedure: RIGHT/LEFT HEART CATH AND CORONARY ANGIOGRAPHY;  Surgeon: Cherrie Toribio SAUNDERS, MD;  Location: [facility] INVASIVE CV LAB;  Service: Cardiovascular;  Laterality: N/A;  Renal injection preformed    RIGHT/LEFT HEART CATH AND CORONARY ANGIOGRAPHY N/A 10/25/2020   Procedure: RIGHT/LEFT HEART CATH AND CORONARY ANGIOGRAPHY;  Surgeon: Anner Alm ORN, MD;  Location: [facility] INVASIVE CV LAB;  Service: Cardiovascular;  Laterality: N/A;   TENDON RELEASE Right 2012   hand   TUBAL LIGATION  05/11/2011   Procedure: POST PARTUM TUBAL LIGATION;  Surgeon: Lynwood Solomons, MD;  Location: [facility];  Service: Gynecology;  Laterality: Bilateral;  Induced for HTN   Patient Active Problem List   Diagnosis Date Noted   Prediabetes 06/13/2023   Moderate episode of recurrent major depressive disorder (HCC) 02/04/2023   GAD (generalized anxiety disorder) 02/04/2023   Chronic respiratory failure with hypoxia (HCC) 12/31/2021   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   Coronary artery disease    Chronic stable angina (HCC) 08/22/2020   SVT (supraventricular tachycardia) (HCC) 08/22/2020   Palpitations 02/28/2020   Right ankle pain 04/14/2019   OSA (obstructive sleep apnea) 10/01/2018   Tibialis posterior tendinitis, left 07/15/2018   Chronic pain in left foot 06/29/2018   Vitamin D  deficiency disease 03/31/2018   Persistent proteinuria 03/31/2018   Hyperlipidemia LDL goal <100 03/30/2018   Chemotherapy-induced peripheral neuropathy (HCC) 12/09/2016   Asthma in adult, mild persistent, uncomplicated  01/19/2016   Hypertensive heart disease 09/26/2015   Depression with somatization 06/15/2015   Hypertriglyceridemia without hypercholesterolemia 06/15/2015   Hypokalemia 06/15/2015   Insomnia secondary to anxiety 04/05/2015   Idiopathic intracranial hypertension 10/21/2014   Upper airway cough syndrome 10/09/2014   Malignant hypertension 10/09/2014   Morbid obesity due to excess calories (HCC) 10/09/2014   Apnea, sleep 05/04/2014   Acute on chronic diastolic CHF (congestive heart failure), NYHA class 2 (HCC) 05/03/2014   DOE (dyspnea on exertion) 07/14/2013   Acute on chronic diastolic heart failure (HCC) 06/23/2013   Malignant neoplasm of upper-outer quadrant of left breast in female, estrogen receptor negative (HCC) 01/07/2013   Gastroparesis 01/04/2013   Resistant hypertension 09/17/2011   Axillary hidradenitis  suppurativa 08/01/2011   Preventative health care 04/13/2011   Hyperglycemia 04/13/2011   GERD (gastroesophageal reflux disease) 03/18/2011   Cough variant asthma 09/27/2010    PCP: Knute Thersia Bitters, FNP  REFERRING PROVIDER: Joane Artist RAMAN, MD  REFERRING DIAG: Lumbar radiculopathy  Rationale for Evaluation and Treatment: Rehabilitation  THERAPY DIAG:  Other low back pain  Pain in right leg  Muscle weakness (generalized)  ONSET DATE: August 2024   SUBJECTIVE:     SUBJECTIVE STATEMENT: Patient arrives in right CAM boot due to ankle pain, she saw the doctor recently.  Eval: Patients reports she had renal denervation done in August 2024 and has been in pain ever since. Pain is located across the lower back, she was told it would hurt for 3-4 months but pain persisted in the lower back. The right knee pain started after in October or November, and it will keep her up at night. She states it feels heavy in her lower back, like labor pain that never stops. The pain will start in the right lower back region and then will feel like lightning shooting down the right  leg to the back and outside of the right knee, pain does not go past the knee. She leans on the bed for relief, and she states getting on her stomach gives her the most relief, but she has a lot of pain when getting up. She does use a heating pad as well. She reports that to help with the knee pain at night she has to have her leg up on the wall. She states she moves around a lot because sitting or standing for long periods will make it worse. Will occasionally have trouble bending down and she states the right leg has given out on her causing her to fall. She does have trouble going up/down stairs, she turns to the side when going down, and she also has breathing issues so when she goes up stairs to get inside she has to immediately get on oxygen. She reports inability to play with or lift her grandchildren and difficulty completing activities around her home for her son.  PERTINENT HISTORY:  See PMH above  PAIN:  Are you having pain? Yes:  NPRS scale: 7/10 currently, 10/10 at worst Pain location: Lower back and right leg/knee Pain description: Heavy, labor pains, constant Aggravating factors: Standing, sitting, activity Relieving factors: Lying on stomach, leaning on bed or counter, heat  PRECAUTIONS: Fall, patient reports she was told not to lift more than 10 lbs  FALLS:  Has patient fallen in last 6 months? Yes. Number of falls 3-4, right knee gives out or sharp pains could cause her to fall  PATIENT GOALS: Pain relief, get back to doing normal things   OBJECTIVE:  Note: Objective measures were completed at Evaluation unless otherwise noted. PATIENT SURVEYS:  Modified Oswestry 37/50 (74% disability)   08/29/2023: 33/50 (66%)  10/06/2023: 31/50 (62%)  SENSATION: Patient reports neuropathy of feet and hands  MUSCLE LENGTH: Unable to fully assess, severe increase in right lower back and leg pain when attempting to assess hamstring length  POSTURE:  Rounded shoulder posture, normal  lumbar lordosis, weight shift toward left and patient frequently changing positions while seated for comfort  PALPATION: Exquisite tenderness to right lower lumbar paraspinals, right gluteal and TFL region, right lateral thigh and knee; moderate tenderness to left lumbar paraspinals  Unable to assess lumbar mobility  LUMBAR ROM:   AROM eval  Flexion   Extension  Right lateral flexion   Left lateral flexion   Right rotation   Left rotation    (Blank rows = not tested)  LOWER EXTREMITY ROM:    Limitations in right hip ROM due to pain and guarding, right knee flexion limited due to pain  LOWER EXTREMITY MMT:    MMT Right eval Left eval Rt / Lt 07/08/2023 Right 07/22/2023 Right 07/31/2023 Rt / Lt 08/06/2023 Rt / Lt 08/29/2023  Hip flexion 3+ 4       Hip extension 2 3- 3 / 3   3 / 3+ 3 / 3+  Hip abduction 2     3 / 3+ 3 / 3+  Hip adduction         Hip internal rotation         Hip external rotation         Knee flexion 4- 4  4     Knee extension 4- 4+  4 4 4 4  / 5  Ankle dorsiflexion         Ankle plantarflexion         Ankle inversion         Ankle eversion          (Blank rows = not tested)  LUMBAR SPECIAL TESTS:  SLR positive on right  FUNCTIONAL TESTS:  Sit to stand: patient will difficulty standing from standard chair, requires UE for support with transferring from sit to stand 08/06/2023: patient able to stand from standard chair without support  GAIT: Assistive device utilized: None Level of assistance: Complete Independence Comments: Antalgic on right   TREATMENT [facility] Adult PT Treatment:                                                DATE: 10/17/2023 Recumbent bike L3 x 6 min to improve endurance and workload capacity Longsitting ankle PF with yellow 3 x 10 Seated ankle DF with yellow 3 x 10 LAQ with 5# 3 x 10 each Seated pelvic tilt 2 x 10 Seated abdominal set with stability ball press 2 x 10 x 5 sec Seated lumbar extension holding dowel with black band 2 x  10 x 5 sec  Patient educated on use of EvenUp shoe attachment for the left shoe while she is wearing the boot on the right. She was also encouraged to use a pillow at her lower back with sitting to assist with lumbar support.   PATIENT EDUCATION:  Education details: HEP update Person educated: Patient Education method: Explanation, Demonstration, Tactile cues, Verbal cues, Handout Education comprehension: verbalized understanding, returned demonstration, verbal cues required, tactile cues required, and needs further education  HOME EXERCISE PROGRAM: Access Code: [email or web address]    ASSESSMENT: CLINICAL IMPRESSION: Patient tolerated therapy well with no adverse effects. Therapy incorporated some right ankle strengthening for the right with good tolerance. Continued with LE and core exercises and reduced amount of weight bearing exercises patient performed due to right ankle. Updated her HEP to include right ankle strengthening for home. Patient would benefit from continued skilled PT to progress mobility and strength in order to reduce pain and maximize functional ability.   Eval: Patient is a 48 y.o. female who was seen today for physical therapy evaluation and treatment for chronic lower back and right knee pain. Her symptoms do seem consistent with nonspecific lower back pain and right  sided lumbar radiculopathy. Evaluation was limited this visit due to patient's high symptom severity and irritability level. She did become tearful during the evaluation. Trial of e-stim IFC combined with cold pack with patient reporting pain level reduction, and provided patient with information for purchasing home TENS unit for pain relief. Unable to provide patient with HEP this visit due to inability to complete exercise without severe pain. Patient was provided extensive education on possible etiologies of her symptoms and treatments that could be utilized in therapy, and gradual progression for return to activity.    OBJECTIVE IMPAIRMENTS: Abnormal gait, decreased activity tolerance, decreased balance, decreased ROM, decreased strength, impaired flexibility, postural dysfunction, and pain.   ACTIVITY LIMITATIONS: carrying, lifting, bending, sitting, standing, squatting, sleeping, stairs, transfers, bed mobility, locomotion level, and caring for others  PARTICIPATION LIMITATIONS: meal prep, cleaning, laundry, driving, shopping, and community activity  PERSONAL FACTORS: Fitness, Past/current experiences, Time since onset of injury/illness/exacerbation, and 3+ comorbidities: see PMH above are also affecting patient's functional outcome.    GOALS: Goals reviewed with patient? Yes  SHORT TERM GOALS: Target date: 09/26/2023  Patient will be I with initial HEP in order to progress with therapy. Baseline: None provided at eval 07/28/2023: patient reports independence with HEP Goal status: MET  2.  Patient will report low back and right leg pain </= 7/10 at worst to reduce functional limitations Baseline: 10/10 pain at worst 07/29/2023: reports pain at worst still 10/10 08/06/2023: continues to report pain 10/10 at worst 08/29/2023: continues to report pain 10/10 at worst 09/16/2023: continues to report pain 10/10 at worst Goal status: ONGOING  3.  Patient will be able to perform sit to stand without difficulty or using UE for support to improve mobility and indicate improvement in LE strength Baseline: requires UE for support with standing from chair 07/29/2023: able to perform sit to stand without UE support but exhibits difficulty  08/06/2023: able to perform sit to stand without UE support but exhibits difficulty due to knee pain 08/29/2023: patient can perform sit to stand without difficulty Goal status: MET  LONG TERM GOALS: Target date: 10/24/2023  Patient will be I with final HEP to maintain progress from PT. Baseline: None provided at eval 08/06/2023: progressing 08/29/2023: progressing Goal status:  ONGOING  2.  Patient will report Modified Oswestry </= 25/50 (50% disability) in order to indicate an improvement in their functional status Baseline: 37/50 (74% disability) 08/06/2023: not assessed 08/29/2023: 33/50 (66%) 10/06/2023: 31/50 (62%) Goal status: ONGOING  3.  Patient will demonstrate improvement in core and hip strength grossly >/= 4-/5 MMT in order to improve her activity tolerance with household tasks Baseline: see limitations above 08/06/2023: see limitations above 08/29/2023: see limitations above Goal status: ONGOING  4.  Patient will demonstrate right knee strength >/= 4+/5 MMT in order to improve stair negotiation and reduce fall risk due to right knee giving out Baseline: see limitations above 08/06/2023: see limitations above 08/29/2023: see limitations above Goal status: ONGOING  5. Patient will report lower back and right leg pain </= 4/10 in order to reduce functional limitations  Baseline: 10/10 at worst  08/06/2023: continues to report pain 10/10 at worst 08/29/2023: continues to report pain 10/10 at worst 09/16/2023: continues to report pain 10/10 at worst  Goal status: ONGOING   PLAN: PT FREQUENCY: 1-2x/week  PT DURATION: 8 weeks  PLANNED INTERVENTIONS: 97164- PT Re-evaluation, 97110-Therapeutic exercises, 97530- Therapeutic activity, 97112- Neuromuscular re-education, 97535- Self Care, 02859- Manual therapy, G0283- Electrical stimulation (unattended), [id number]- Electrical  stimulation (manual), Patient/Family education, Balance training, Taping, Dry Needling, Joint mobilization, Joint manipulation, Spinal manipulation, Spinal mobilization, Cryotherapy, and Moist heat.  PLAN FOR NEXT SESSION: Review HEP and progress PRN, continue with modalities for pain relief as needed, manual/TPDN for right lumbar and gluteal region, LAD, gentle stretching and mobility for lumbar, gentle core activation, hip and LE strengthening as tolerated    Elaine Daring, PT, DPT, LAT,  ATC 10/17/23  10:56 AM Phone: [phone] Fax: [phone]

## 2023-10-17 NOTE — Patient Instructions (Signed)
 Access Code: [email or web address] URL: [email or web address] Date: 10/17/2023 Prepared by: Elaine Daring  Exercises - Seated Hip Abduction with Resistance  - 1-2 x daily - 2 sets - 10 reps - Seated Pelvic Tilt  - 1-2 x daily - 2 sets - 10 reps - Seated Long Arc Quad  - 1-2 x daily - 2 sets - 10 reps - Sit to Stand  - 1-2 x daily - 2 sets - 10 reps - Standing Hip Abduction with Counter Support  - 1-2 x daily - 2 sets - 10 reps - Standing Hip Extension with Counter Support  - 1-2 x daily - 2 sets - 10 reps - Heel Toe Raises with Counter Support  - 1 x daily - 3 sets - 10 reps - Standing Lumbar Extension at Wall - Forearms  - 1-2 x daily - 2 sets - 10 reps - Long Sitting Ankle Plantar Flexion with Resistance  - 1 x daily - 3 sets - 10 reps - Seated Ankle Dorsiflexion with Resistance  - 1 x daily - 3 sets - 10 reps

## 2023-10-18 ENCOUNTER — Other Ambulatory Visit: Payer: Self-pay | Admitting: Cardiovascular Disease

## 2023-10-18 DIAGNOSIS — I2510 Atherosclerotic heart disease of native coronary artery without angina pectoris: Secondary | ICD-10-CM

## 2023-10-18 DIAGNOSIS — E785 Hyperlipidemia, unspecified: Secondary | ICD-10-CM

## 2023-10-20 ENCOUNTER — Other Ambulatory Visit: Payer: Self-pay

## 2023-10-20 ENCOUNTER — Ambulatory Visit (INDEPENDENT_AMBULATORY_CARE_PROVIDER_SITE_OTHER): Admitting: Physical Therapy

## 2023-10-20 ENCOUNTER — Encounter: Payer: Self-pay | Admitting: Physical Therapy

## 2023-10-20 DIAGNOSIS — M6281 Muscle weakness (generalized): Secondary | ICD-10-CM

## 2023-10-20 DIAGNOSIS — M79604 Pain in right leg: Secondary | ICD-10-CM

## 2023-10-20 DIAGNOSIS — M5459 Other low back pain: Secondary | ICD-10-CM

## 2023-10-20 NOTE — Therapy (Signed)
 OUTPATIENT PHYSICAL THERAPY TREATMENT    Patient Name: Erica [name] MRN: [id number] DOB:[birth year], 48 y.o., female Today's Date: 10/20/2023   END OF SESSION:  PT End of Session - 10/20/23 0805     Visit Number 27    Number of Visits 39    Date for PT Re-Evaluation 12/01/23    Authorization Type [employer] MCR Dual Complete    Progress Note Due on Visit 30    PT Start Time 0802    PT Stop Time 0845    PT Time Calculation (min) 43 min    Activity Tolerance Patient tolerated treatment well    Behavior During Therapy WFL for tasks assessed/performed                                    Past Medical History:  Diagnosis Date   Allergy     Anxiety    Apnea, sleep 05/04/2014   AHI 31/hr now on CPAP at 12cm H2O   Arthritis    Asthma    Asthma without status asthmaticus 01/19/2016   Axillary hidradenitis suppurativa 08/01/2011   Blood transfusion without reported diagnosis    Breast cancer (HCC) 2010   a. locally advanced left breast carcinoma diagnosed in 2010 and treated with neoadjuvant chemotherapy with docetaxel and cyclophosphamide as well as paclitaxel. This was followed by radiation therapy which was completed in 2011.   Chemotherapy-induced peripheral neuropathy (HCC) 12/09/2016   CHF (congestive heart failure) (HCC)    Chronic diastolic CHF (congestive heart failure) (HCC) 06/23/2013   Chronic kidney disease    Chronic pain in left foot 06/29/2018   Chronic stable angina (HCC) 08/22/2020   Cough variant asthma 09/27/2010   Followed in Pulmonary clinic/ [facility]/ [facility]  Onset reported in childhood  - PFT's 01/31/2011 FEV1  1.76 (60% ) ratio 68 and no better p B2,  DLCO 70% -med review 09/25/2012 > [place]  drug assistance  paperwork given    - HFA 90% 11/20/2012 .  -Med calendar 12/07/12 , 04/06/2013 , 04/18/2014,, 08/30/2014 , 11/08/2014  - PFT's 01/12/2013  1.73 (65%)  Ratio 73 and no better p B2, DLCO 86% - trial off    Depression with somatization  06/15/2015   Dyspnea on exertion    a. 01/2013 Lexi MV: EF 54%, no ischemia/infarct;  b. 05/2013 Echo: EF 60-65%, no rwma, Gr 2 DD;  b.    Essential hypertension 10/09/2014   Estimated Creatinine Clearance: 82.4 mL/min (by C-G formula based on SCr of 0.98 mg/dL).   Fatty liver    Gastroparesis    GERD (gastroesophageal reflux disease)    Hyperglycemia 04/13/2011   Hypertension    Hypertensive heart disease 09/26/2015   Hypertriglyceridemia without hypercholesterolemia 06/15/2015   Framingham risk score is 1%    Idiopathic intracranial hypertension 10/21/2014   Impaired glucose tolerance 04/13/2011   Insomnia secondary to anxiety 04/05/2015   Lymphadenitis, chronic    restricted LEFT extremity   Malignant neoplasm of upper-outer quadrant of left breast in female, estrogen receptor negative (HCC) 01/07/2013   Stage III, Invasive Ductal, s/p partial Left Mastectomy/Lumpectomy (baseball sized) with Lymph Node dissection, had Chemo and RadRx     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   OSA (obstructive sleep apnea) 05/04/2014   AHI 31/hr now on CPAP at 12cm H2O   Oxygen deficiency    Uses 02 in CPAP   Persistent proteinuria 03/31/2018   Personal history  of chemotherapy 2010   Personal history of radiation therapy 2010   Resistant hypertension 09/17/2011       Sleep apnea    SVT (supraventricular tachycardia) (HCC) 08/22/2020   Tibialis posterior tendinitis, left 07/15/2018   Injected in March 10, 2019   Ulcer    Upper airway cough syndrome 10/09/2014   Onset around 2012 with assoc pseudowheeze MRI 05/18/15 > Paranasal sinuses are clear - 11/17/2015 rec increase h1 to 2 hs to help with noct cough  - Allergy  profile 11/17/2015 >  Eos 0.1 /  IgE  80 POS RAST  to grass, ragweed, cedar tree  - d/c acei 03/06/2018  - flare end of Jan 2020 in setting of ? Uri/ poorly controlled GERD - flare mid Oct 2020 with uri / assoc secondary gerd   Vitamin D  deficiency disease 03/31/2018   Past Surgical  History:  Procedure Laterality Date   BRAIN SURGERY     Shunt   BREAST LUMPECTOMY Left 09/2008   CARPAL TUNNEL RELEASE Left 2011   LYMPH NODE DISSECTION Left 2010   breast; 2 wks after breast lumpectomy   RENAL DENERVATION N/A 11/20/2022   Procedure: RENAL DENERVATION;  Surgeon: Darron Deatrice LABOR, MD;  Location: [facility] INVASIVE CV LAB;  Service: Cardiovascular;  Laterality: N/A;   RIGHT/LEFT HEART CATH AND CORONARY ANGIOGRAPHY N/A 05/04/2019   Procedure: RIGHT/LEFT HEART CATH AND CORONARY ANGIOGRAPHY;  Surgeon: Cherrie Toribio SAUNDERS, MD;  Location: [facility]VAS[facility] CV [facility];  Service: Cardiovascular;  Laterality: N/A;  Renal injection preformed    RIGHT/LEFT HEART CATH AND CORONARY ANGIOGRAPHY N/A 10/25/2020   Procedure: RIGHT/LEFT HEART CATH AND CORONARY ANGIOGRAPHY;  Surgeon: Anner Alm ORN, MD;  Location: [facility] INVASIVE CV LAB;  Service: Cardiovascular;  Laterality: N/A;   TENDON RELEASE Right 2012   hand   TUBAL LIGATION  05/11/2011   Procedure: POST PARTUM TUBAL LIGATION;  Surgeon: Lynwood Solomons, MD;  Location: [facility];  Service: Gynecology;  Laterality: Bilateral;  Induced for HTN   Patient Active Problem List   Diagnosis Date Noted   Prediabetes 06/13/2023   Moderate episode of recurrent major depressive disorder (HCC) 02/04/2023   GAD (generalized anxiety disorder) 02/04/2023   Chronic respiratory failure with hypoxia (HCC) 12/31/2021   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   Coronary artery disease    Chronic stable angina (HCC) 08/22/2020   SVT (supraventricular tachycardia) (HCC) 08/22/2020   Palpitations 02/28/2020   Right ankle pain 04/14/2019   OSA (obstructive sleep apnea) 10/01/2018   Tibialis posterior tendinitis, left 07/15/2018   Chronic pain in left foot 06/29/2018   Vitamin D  deficiency disease 03/31/2018   Persistent proteinuria 03/31/2018   Hyperlipidemia LDL goal <100 03/30/2018   Chemotherapy-induced peripheral neuropathy (HCC) 12/09/2016   Asthma in adult, mild persistent, uncomplicated  01/19/2016   Hypertensive heart disease 09/26/2015   Depression with somatization 06/15/2015   Hypertriglyceridemia without hypercholesterolemia 06/15/2015   Hypokalemia 06/15/2015   Insomnia secondary to anxiety 04/05/2015   Idiopathic intracranial hypertension 10/21/2014   Upper airway cough syndrome 10/09/2014   Malignant hypertension 10/09/2014   Morbid obesity due to excess calories (HCC) 10/09/2014   Apnea, sleep 05/04/2014   Acute on chronic diastolic CHF (congestive heart failure), NYHA class 2 (HCC) 05/03/2014   DOE (dyspnea on exertion) 07/14/2013   Acute on chronic diastolic heart failure (HCC) 06/23/2013   Malignant neoplasm of upper-outer quadrant of left breast in female, estrogen receptor negative (HCC) 01/07/2013   Gastroparesis 01/04/2013   Resistant hypertension 09/17/2011   Axillary  hidradenitis suppurativa 08/01/2011   Preventative health care 04/13/2011   Hyperglycemia 04/13/2011   GERD (gastroesophageal reflux disease) 03/18/2011   Cough variant asthma 09/27/2010    PCP: Knute Thersia Bitters, FNP  REFERRING PROVIDER: Joane Artist RAMAN, MD  REFERRING DIAG: Lumbar radiculopathy  Rationale for Evaluation and Treatment: Rehabilitation  THERAPY DIAG:  Other low back pain  Pain in right leg  Muscle weakness (generalized)  ONSET DATE: August 2024   SUBJECTIVE:     SUBJECTIVE STATEMENT: Patient reports she is doing better today but last Friday was a bad day. States that is feels like her back is coming apart.   Eval: Patients reports she had renal denervation done in August 2024 and has been in pain ever since. Pain is located across the lower back, she was told it would hurt for 3-4 months but pain persisted in the lower back. The right knee pain started after in October or November, and it will keep her up at night. She states it feels heavy in her lower back, like labor pain that never stops. The pain will start in the right lower back region and then will  feel like lightning shooting down the right leg to the back and outside of the right knee, pain does not go past the knee. She leans on the bed for relief, and she states getting on her stomach gives her the most relief, but she has a lot of pain when getting up. She does use a heating pad as well. She reports that to help with the knee pain at night she has to have her leg up on the wall. She states she moves around a lot because sitting or standing for long periods will make it worse. Will occasionally have trouble bending down and she states the right leg has given out on her causing her to fall. She does have trouble going up/down stairs, she turns to the side when going down, and she also has breathing issues so when she goes up stairs to get inside she has to immediately get on oxygen. She reports inability to play with or lift her grandchildren and difficulty completing activities around her home for her son.  PERTINENT HISTORY:  See PMH above  PAIN:  Are you having pain? Yes:  NPRS scale: 5/10 currently, 10/10 at worst Pain location: Lower back and right leg/knee Pain description: coming apart Aggravating factors: Standing, sitting, activity Relieving factors: Lying on stomach, leaning on bed or counter, heat  PRECAUTIONS: Fall, patient reports she was told not to lift more than 10 lbs  FALLS:  Has patient fallen in last 6 months? Yes. Number of falls 3-4, right knee gives out or sharp pains could cause her to fall  PATIENT GOALS: Pain relief, get back to doing normal things   OBJECTIVE:  Note: Objective measures were completed at Evaluation unless otherwise noted. PATIENT SURVEYS:  Modified Oswestry 37/50 (74% disability)   08/29/2023: 33/50 (66%)  10/06/2023: 31/50 (62%) 10/20/2023: 30/50 (60%)  SENSATION: Patient reports neuropathy of feet and hands  MUSCLE LENGTH: Unable to fully assess, severe increase in right lower back and leg pain when attempting to assess hamstring  length  POSTURE:  Rounded shoulder posture, normal lumbar lordosis, weight shift toward left and patient frequently changing positions while seated for comfort  PALPATION: Exquisite tenderness to right lower lumbar paraspinals, right gluteal and TFL region, right lateral thigh and knee; moderate tenderness to left lumbar paraspinals  Unable to assess lumbar mobility  LUMBAR  ROM:   AROM eval  Flexion   Extension   Right lateral flexion   Left lateral flexion   Right rotation   Left rotation    (Blank rows = not tested)  LOWER EXTREMITY ROM:    Limitations in right hip ROM due to pain and guarding, right knee flexion limited due to pain  LOWER EXTREMITY MMT:    MMT Right eval Left eval Rt / Lt 07/08/2023 Right 07/22/2023 Right 07/31/2023 Rt / Lt 08/06/2023 Rt / Lt 08/29/2023 Rt / Lt 10/20/2023  Hip flexion 3+ 4        Hip extension 2 3- 3 / 3   3 / 3+ 3 / 3+ 3+ / 3+  Hip abduction 2     3 / 3+ 3 / 3+ 3+ / 3+  Hip adduction          Hip internal rotation          Hip external rotation          Knee flexion 4- 4  4      Knee extension 4- 4+  4 4 4 4  / 5 4 / 5  Ankle dorsiflexion          Ankle plantarflexion          Ankle inversion          Ankle eversion           (Blank rows = not tested)  LUMBAR SPECIAL TESTS:  SLR positive on right  FUNCTIONAL TESTS:  Sit to stand: patient will difficulty standing from standard chair, requires UE for support with transferring from sit to stand 08/06/2023: patient able to stand from standard chair without support  GAIT: Assistive device utilized: None Level of assistance: Complete Independence Comments: Antalgic on right   TREATMENT [facility] Adult PT Treatment:                                                DATE: 10/20/2023 Recumbent bike L4 x 5 min to improve endurance and workload capacity Longsitting 4-way ankle with yellow 3 x 10 Seated pelvic tilt 2 x 10 Seated clamshell with green 2 x 20 LAQ with 5# 3 x 10 each  PATIENT  EDUCATION:  Education details: POC extension, HEP Person educated: Patient Education method: Explanation, Demonstration, Tactile cues, Verbal cues Education comprehension: verbalized understanding, returned demonstration, verbal cues required, tactile cues required, and needs further education  HOME EXERCISE PROGRAM: Access Code: [email or web address]    ASSESSMENT: CLINICAL IMPRESSION: Patient tolerated therapy well with no adverse effects. She arrives reporting improved pain this visit but still states her pain can get to 10/10. She does continue to report an improvement in her functional status on modified oswestry this visit. She does exhibit a slight improvement in her hip strength this visit but overall remains limited in core, hip, and knee strength. Therapy continued strengthening for the right ankle, hip, and LE with good tolerance. No changes to her HEP this visit. Patient would benefit from continued skilled PT to progress mobility and strength in order to reduce pain and maximize functional ability.   Eval: Patient is a 48 y.o. female who was seen today for physical therapy evaluation and treatment for chronic lower back and right knee pain. Her symptoms do seem consistent with nonspecific lower back pain and right sided lumbar radiculopathy.  Evaluation was limited this visit due to patient's high symptom severity and irritability level. She did become tearful during the evaluation. Trial of e-stim IFC combined with cold pack with patient reporting pain level reduction, and provided patient with information for purchasing home TENS unit for pain relief. Unable to provide patient with HEP this visit due to inability to complete exercise without severe pain. Patient was provided extensive education on possible etiologies of her symptoms and treatments that could be utilized in therapy, and gradual progression for return to activity.   OBJECTIVE IMPAIRMENTS: Abnormal gait, decreased activity tolerance,  decreased balance, decreased ROM, decreased strength, impaired flexibility, postural dysfunction, and pain.   ACTIVITY LIMITATIONS: carrying, lifting, bending, sitting, standing, squatting, sleeping, stairs, transfers, bed mobility, locomotion level, and caring for others  PARTICIPATION LIMITATIONS: meal prep, cleaning, laundry, driving, shopping, and community activity  PERSONAL FACTORS: Fitness, Past/current experiences, Time since onset of injury/illness/exacerbation, and 3+ comorbidities: see PMH above are also affecting patient's functional outcome.    GOALS: Goals reviewed with patient? Yes  SHORT TERM GOALS: Target date: 09/26/2023  Patient will be I with initial HEP in order to progress with therapy. Baseline: None provided at eval 07/28/2023: patient reports independence with HEP Goal status: MET  2.  Patient will report low back and right leg pain </= 7/10 at worst to reduce functional limitations Baseline: 10/10 pain at worst 07/29/2023: reports pain at worst still 10/10 08/06/2023: continues to report pain 10/10 at worst 08/29/2023: continues to report pain 10/10 at worst 09/16/2023: continues to report pain 10/10 at worst 10/20/2023: continues to report pain 10/10 at worst Goal status: ONGOING  3.  Patient will be able to perform sit to stand without difficulty or using UE for support to improve mobility and indicate improvement in LE strength Baseline: requires UE for support with standing from chair 07/29/2023: able to perform sit to stand without UE support but exhibits difficulty  08/06/2023: able to perform sit to stand without UE support but exhibits difficulty due to knee pain 08/29/2023: patient can perform sit to stand without difficulty Goal status: MET  LONG TERM GOALS: Target date: 12/01/2023  Patient will be I with final HEP to maintain progress from PT. Baseline: None provided at eval 08/06/2023: progressing 08/29/2023: progressing 10/20/2023: progressing Goal  status: ONGOING  2.  Patient will report Modified Oswestry </= 25/50 (50% disability) in order to indicate an improvement in their functional status Baseline: 37/50 (74% disability) 08/06/2023: not assessed 08/29/2023: 33/50 (66%) 10/06/2023: 31/50 (62%) 10/20/2023: 30/50 (60%) Goal status: ONGOING  3.  Patient will demonstrate improvement in core and hip strength grossly >/= 4-/5 MMT in order to improve her activity tolerance with household tasks Baseline: see limitations above 08/06/2023: see limitations above 08/29/2023: see limitations above 10/20/2023: see limitations above Goal status: ONGOING  4.  Patient will demonstrate right knee strength >/= 4+/5 MMT in order to improve stair negotiation and reduce fall risk due to right knee giving out Baseline: see limitations above 08/06/2023: see limitations above 08/29/2023: see limitations above 10/20/2023: see limitations above Goal status: ONGOING  5. Patient will report lower back and right leg pain </= 4/10 in order to reduce functional limitations  Baseline: 10/10 at worst  08/06/2023: continues to report pain 10/10 at worst 08/29/2023: continues to report pain 10/10 at worst 09/16/2023: continues to report pain 10/10 at worst 10/20/2023: continues to report pain 10/10 at worst  Goal status: ONGOING   PLAN: PT FREQUENCY: 1-2x/week  PT DURATION: 6 weeks  PLANNED INTERVENTIONS: 97164- PT Re-evaluation, 97110-Therapeutic exercises, 97530- Therapeutic activity, [id number]- Neuromuscular re-education, 97535- Self Care, [id number]- Manual therapy, [id number]- Electrical stimulation (unattended), [phone]- Electrical stimulation (manual), Patient/Family education, Balance training, Taping, Dry Needling, Joint mobilization, Joint manipulation, Spinal manipulation, Spinal mobilization, Cryotherapy, and Moist heat.  PLAN FOR NEXT SESSION: Review HEP and progress PRN, continue with modalities for pain relief as needed, manual/TPDN for right lumbar and gluteal region,  LAD, gentle stretching and mobility for lumbar, gentle core activation, hip and LE strengthening as tolerated    Elaine Daring, PT, DPT, LAT, ATC 10/20/23  10:06 AM Phone: [phone] Fax: [phone]

## 2023-10-21 ENCOUNTER — Inpatient Hospital Stay: Payer: Medicare Other

## 2023-10-21 ENCOUNTER — Inpatient Hospital Stay: Payer: Medicare Other | Attending: Adult Health | Admitting: Adult Health

## 2023-10-21 ENCOUNTER — Encounter: Payer: Self-pay | Admitting: Adult Health

## 2023-10-21 VITALS — BP 185/83 | HR 95 | Temp 98.2°F | Resp 18 | Ht 66.0 in | Wt 220.3 lb

## 2023-10-21 DIAGNOSIS — N920 Excessive and frequent menstruation with regular cycle: Secondary | ICD-10-CM | POA: Insufficient documentation

## 2023-10-21 DIAGNOSIS — Z853 Personal history of malignant neoplasm of breast: Secondary | ICD-10-CM | POA: Diagnosis present

## 2023-10-21 DIAGNOSIS — Z79818 Long term (current) use of other agents affecting estrogen receptors and estrogen levels: Secondary | ICD-10-CM | POA: Insufficient documentation

## 2023-10-21 DIAGNOSIS — C50412 Malignant neoplasm of upper-outer quadrant of left female breast: Secondary | ICD-10-CM

## 2023-10-21 DIAGNOSIS — Z171 Estrogen receptor negative status [ER-]: Secondary | ICD-10-CM | POA: Diagnosis not present

## 2023-10-21 DIAGNOSIS — Z8742 Personal history of other diseases of the female genital tract: Secondary | ICD-10-CM | POA: Diagnosis not present

## 2023-10-21 DIAGNOSIS — I10 Essential (primary) hypertension: Secondary | ICD-10-CM | POA: Diagnosis not present

## 2023-10-21 LAB — CMP (CANCER CENTER ONLY)
ALT: 31 U/L (ref 0–44)
AST: 31 U/L (ref 15–41)
Albumin: 4.2 g/dL (ref 3.5–5.0)
Alkaline Phosphatase: 70 U/L (ref 38–126)
Anion gap: 5 (ref 5–15)
BUN: 8 mg/dL (ref 6–20)
CO2: 30 mmol/L (ref 22–32)
Calcium: 9.3 mg/dL (ref 8.9–10.3)
Chloride: 108 mmol/L (ref 98–111)
Creatinine: 0.94 mg/dL (ref 0.44–1.00)
GFR, Estimated: >60 mL/min (ref >60)
Glucose, Bld: 117 mg/dL — ABNORMAL HIGH (ref 70–99)
Potassium: 3.6 mmol/L (ref 3.5–5.1)
Sodium: 143 mmol/L (ref 135–145)
Total Bilirubin: 0.5 mg/dL (ref 0.0–1.2)
Total Protein: 7.9 g/dL (ref 6.5–8.1)

## 2023-10-21 LAB — CBC WITH DIFFERENTIAL (CANCER CENTER ONLY)
Abs Immature Granulocytes: 0.01 K/uL (ref 0.00–0.07)
Basophils Absolute: 0 K/uL (ref 0.0–0.1)
Basophils Relative: 1 %
Eosinophils Absolute: 0.2 K/uL (ref 0.0–0.5)
Eosinophils Relative: 3 %
HCT: 43.1 % (ref 36.0–46.0)
Hemoglobin: 14.4 g/dL (ref 12.0–15.0)
Immature Granulocytes: 0 %
Lymphocytes Relative: 36 %
Lymphs Abs: 2.3 K/uL (ref 0.7–4.0)
MCH: 30.3 pg (ref 26.0–34.0)
MCHC: 33.4 g/dL (ref 30.0–36.0)
MCV: 90.5 fL (ref 80.0–100.0)
Monocytes Absolute: 0.5 K/uL (ref 0.1–1.0)
Monocytes Relative: 7 %
Neutro Abs: 3.5 K/uL (ref 1.7–7.7)
Neutrophils Relative %: 53 %
Platelet Count: 232 K/uL (ref 150–400)
RBC: 4.76 MIL/uL (ref 3.87–5.11)
RDW: 13.7 % (ref 11.5–15.5)
WBC Count: 6.5 K/uL (ref 4.0–10.5)
nRBC: 0 % (ref 0.0–0.2)

## 2023-10-21 MED ORDER — GOSERELIN ACETATE 3.6 MG ~~LOC~~ IMPL
3.6000 mg | DRUG_IMPLANT | SUBCUTANEOUS | Status: DC
  Administered 2023-10-21: 3.6 mg via SUBCUTANEOUS
  Filled 2023-10-21: qty 3.6

## 2023-10-21 NOTE — Assessment & Plan Note (Signed)
 Erica [name] is a 48 year old woman with history of left-sided stage IIb triple negative breast cancer diagnosed in 2010 status post neoadjuvant chemotherapy, adjuvant chemotherapy, and adjuvant radiation to the left breast.  Assessment and Plan Assessment & Plan History of Breast Cancer. No clinical or radiographic sign of breast cancer recurrence. - Continue annual mammogram  - Healthy diet and exercise discussed.   Heavy menstrual bleeding Chronic heavy menstrual bleeding managed with Zoladex . Concerns about long-term use due to side effects. Previous cessation attempts led to hemorrhaging. - Schedule Zoladex  injection. - Consult with GYN for alternative treatments such as ablation or hysterectomy. - Continue Zoladex  until she returns and we can discuss GYN recommendations.  Hypertension Blood pressure improved but not optimal. Discussed Zoladex 's potential impact on blood pressure. Encouraged dietary changes. - Encourage dietary modifications to include more whole foods and less processed meats and fried foods.  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 Discussed benefits of chair exercises and impact of prolonged sitting. - Continue physical therapy. - Consider chair and core strengthening exercises.   RTC every 4 weeks for injection and f/u in 24 weeks prior to injection.

## 2023-10-21 NOTE — Progress Notes (Signed)
 [facility] Cancer Follow up:    Erica Thersia Bitters, FNP [address] [place] [address]   DIAGNOSIS:  Cancer Staging  Malignant neoplasm of upper-outer quadrant of left breast in female, estrogen receptor negative (HCC) Staging form: Breast, AJCC 7th Edition - Clinical: Stage IIB (T2, N1, M0) - Signed by Layla Sandria BROCKS, MD on 10/20/2014    SUMMARY OF ONCOLOGIC HISTORY: Oncology History  Malignant neoplasm of upper-outer quadrant of left breast in female, estrogen receptor negative (HCC)   Neo-Adjuvant Chemotherapy   Docetaxel and cyclophosphamide x 4   09/2008 Definitive Surgery   Left lumpectomy/SLNB: IDC, ER/PR/ HER2 neu negative, Ki67 88%   09/2008 Pathologic Stage   Stage IIB: ypT2 ypN1   10/2008 Surgery   Full axillary dissection and re-excision of margins    - 03/2009 Chemotherapy   Paclitaxel weekly x 11    - 05/2009 Radiation Therapy   Adjuvant RT to left breast     CURRENT THERAPY: zoladex   INTERVAL HISTORY:  Discussed the use of AI scribe software for clinical note transcription with the patient, who gave verbal consent to proceed.  History of Present Illness Erica [name] is a 48 year old female who presents for follow-up and evaluation prior to receiving Zoladex .  She uses Zoladex  to manage heavy menstrual cycles and has for several years as initiated by Dr. Layla.  He also believed it would help prevent Discontinuation of Zoladex  has led to significant hemorrhaging and large blood clots. Severe bleeding episodes have resulted in loss of consciousness. In the past she notes that initial management by ob/gyn with medication was ineffective, leading to continuous bleeding until Zoladex  was resumed.     Patient Active Problem List   Diagnosis Date Noted   Prediabetes 06/13/2023   Moderate episode of recurrent major depressive disorder (HCC) 02/04/2023   GAD (generalized anxiety disorder) 02/04/2023    Chronic respiratory failure with hypoxia (HCC) 12/31/2021   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   Coronary artery disease    Chronic stable angina (HCC) 08/22/2020   SVT (supraventricular tachycardia) (HCC) 08/22/2020   Palpitations 02/28/2020   Right ankle pain 04/14/2019   OSA (obstructive sleep apnea) 10/01/2018   Tibialis posterior tendinitis, left 07/15/2018   Chronic pain in left foot 06/29/2018   Vitamin D  deficiency disease 03/31/2018   Persistent proteinuria 03/31/2018   Hyperlipidemia LDL goal <100 03/30/2018   Chemotherapy-induced peripheral neuropathy (HCC) 12/09/2016   Asthma in adult, mild persistent, uncomplicated 01/19/2016   Hypertensive heart disease 09/26/2015   Depression with somatization 06/15/2015   Hypertriglyceridemia without hypercholesterolemia 06/15/2015   Hypokalemia 06/15/2015   Insomnia secondary to anxiety 04/05/2015   Idiopathic intracranial hypertension 10/21/2014   Upper airway cough syndrome 10/09/2014   Malignant hypertension 10/09/2014   Morbid obesity due to excess calories (HCC) 10/09/2014   Apnea, sleep 05/04/2014   Acute on chronic diastolic CHF (congestive heart failure), NYHA class 2 (HCC) 05/03/2014   DOE (dyspnea on exertion) 07/14/2013   Acute on chronic diastolic heart failure (HCC) 06/23/2013   Malignant neoplasm of upper-outer quadrant of left breast in female, estrogen receptor negative (HCC) 01/07/2013   Gastroparesis 01/04/2013   Resistant hypertension 09/17/2011   Axillary hidradenitis suppurativa 08/01/2011   Preventative health care 04/13/2011   Hyperglycemia 04/13/2011   GERD (gastroesophageal reflux disease) 03/18/2011   Cough variant asthma 09/27/2010    is allergic to aspirin , bactrim [sulfamethoxazole-trimethoprim], doxycycline , kiwi extract, metoprolol , penicillins, and strawberry extract.  MEDICAL HISTORY: Past Medical  History:  Diagnosis Date   Allergy     Anxiety    Apnea, sleep 05/04/2014   AHI 31/hr now on CPAP at  12cm H2O   Arthritis    Asthma    Asthma without status asthmaticus 01/19/2016   Axillary hidradenitis suppurativa 08/01/2011   Blood transfusion without reported diagnosis    Breast cancer (HCC) 2010   a. locally advanced left breast carcinoma diagnosed in 2010 and treated with neoadjuvant chemotherapy with docetaxel and cyclophosphamide as well as paclitaxel. This was followed by radiation therapy which was completed in 2011.   Chemotherapy-induced peripheral neuropathy (HCC) 12/09/2016   CHF (congestive heart failure) (HCC)    Chronic diastolic CHF (congestive heart failure) (HCC) 06/23/2013   Chronic kidney disease    Chronic pain in left foot 06/29/2018   Chronic stable angina (HCC) 08/22/2020   Cough variant asthma 09/27/2010   Followed in Pulmonary clinic/ [facility]/ [facility]  Onset reported in childhood  - PFT's 01/31/2011 FEV1  1.76 (60% ) ratio 68 and no better p B2,  DLCO 70% -med review 09/25/2012 > [facility]  drug assistance  paperwork given    - HFA 90% 11/20/2012 .  -Med calendar 12/07/12 , 04/06/2013 , 04/18/2014,, 08/30/2014 , 11/08/2014  - PFT's 01/12/2013  1.73 (65%)  Ratio 73 and no better p B2, DLCO 86% - trial off    Depression with somatization 06/15/2015   Dyspnea on exertion    a. 01/2013 Lexi MV: EF 54%, no ischemia/infarct;  b. 05/2013 Echo: EF 60-65%, no rwma, Gr 2 DD;  b.    Essential hypertension 10/09/2014   Estimated Creatinine Clearance: 82.4 mL/min (by C-G formula based on SCr of 0.98 mg/dL).   Fatty liver    Gastroparesis    GERD (gastroesophageal reflux disease)    Hyperglycemia 04/13/2011   Hypertension    Hypertensive heart disease 09/26/2015   Hypertriglyceridemia without hypercholesterolemia 06/15/2015   Framingham risk score is 1%    Idiopathic intracranial hypertension 10/21/2014   Impaired glucose tolerance 04/13/2011   Insomnia secondary to anxiety 04/05/2015   Lymphadenitis, chronic    restricted LEFT extremity   Malignant neoplasm of upper-outer  quadrant of left breast in female, estrogen receptor negative (HCC) 01/07/2013   Stage III, Invasive Ductal, s/p partial Left Mastectomy/Lumpectomy (baseball sized) with Lymph Node dissection, had Chemo and RadRx     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   OSA (obstructive sleep apnea) 05/04/2014   AHI 31/hr now on CPAP at 12cm H2O   Oxygen deficiency    Uses 02 in CPAP   Persistent proteinuria 03/31/2018   Personal history of chemotherapy 2010   Personal history of radiation therapy 2010   Resistant hypertension 09/17/2011       Sleep apnea    SVT (supraventricular tachycardia) (HCC) 08/22/2020   Tibialis posterior tendinitis, left 07/15/2018   Injected in March 10, 2019   Ulcer    Upper airway cough syndrome 10/09/2014   Onset around 2012 with assoc pseudowheeze MRI 05/18/15 > Paranasal sinuses are clear - 11/17/2015 rec increase h1 to 2 hs to help with noct cough  - Allergy  profile 11/17/2015 >  Eos 0.1 /  IgE  80 POS RAST  to grass, ragweed, cedar tree  - d/c acei 03/06/2018  - flare end of Jan 2020 in setting of ? Uri/ poorly controlled GERD - flare mid Oct 2020 with uri / assoc secondary gerd   Vitamin D  deficiency disease 03/31/2018    SURGICAL HISTORY: Past Surgical  History:  Procedure Laterality Date   BRAIN SURGERY     Shunt   BREAST LUMPECTOMY Left 09/2008   CARPAL TUNNEL RELEASE Left 2011   LYMPH NODE DISSECTION Left 2010   breast; 2 wks after breast lumpectomy   RENAL DENERVATION N/A 11/20/2022   Procedure: RENAL DENERVATION;  Surgeon: Darron Deatrice LABOR, MD;  Location: [facility] INVASIVE CV LAB;  Service: Cardiovascular;  Laterality: N/A;   RIGHT/LEFT HEART CATH AND CORONARY ANGIOGRAPHY N/A 05/04/2019   Procedure: RIGHT/LEFT HEART CATH AND CORONARY ANGIOGRAPHY;  Surgeon: Cherrie Toribio SAUNDERS, MD;  Location: [facility] INVASIVE CV LAB;  Service: Cardiovascular;  Laterality: N/A;  Renal injection preformed    RIGHT/LEFT HEART CATH AND CORONARY ANGIOGRAPHY N/A 10/25/2020   Procedure:  RIGHT/LEFT HEART CATH AND CORONARY ANGIOGRAPHY;  Surgeon: Anner Alm ORN, MD;  Location: [facility] INVASIVE CV LAB;  Service: Cardiovascular;  Laterality: N/A;   TENDON RELEASE Right 2012   hand   TUBAL LIGATION  05/11/2011   Procedure: POST PARTUM TUBAL LIGATION;  Surgeon: Lynwood Solomons, MD;  Location: [facility];  Service: Gynecology;  Laterality: Bilateral;  Induced for HTN    SOCIAL HISTORY: Social History   Socioeconomic History   Marital status: Single    Spouse name: Not on file   Number of children: 3   Years of education: Not on file   Highest education level: Bachelor's degree (e.g., BA, AB, BS)  Occupational History    Employer: UNEMPLOYED  Tobacco Use   Smoking status: Never   Smokeless tobacco: Never  Vaping Use   Vaping status: Never Used  Substance and Sexual Activity   Alcohol use: Yes    Comment: occasionally/socially   Drug use: No   Sexual activity: Yes    Birth control/protection: Surgical, None  Other Topics Concern   Not on file  Social History Narrative   She lives with four children.   She is currently not working (disbaility pending).   Left handed   One story home   Drinks caffeine      Social Drivers of Health   Financial Resource Strain: High Risk (06/12/2023)   Overall Financial Resource Strain (CARDIA)    Difficulty of Paying Living Expenses: Very hard  Food Insecurity: Food Insecurity Present (06/12/2023)   Hunger Vital Sign    Worried About Running Out of Food in the Last Year: Sometimes true    Ran Out of Food in the Last Year: Sometimes true  Transportation Needs: Patient Declined (06/12/2023)   PRAPARE - Administrator, Civil Service (Medical): Patient declined    Lack of Transportation (Non-Medical): Patient declined  Physical Activity: Unknown (06/12/2023)   Exercise Vital Sign    Days of Exercise per Week: Patient declined    Minutes of Exercise per Session: 0 min  Stress: Stress Concern Present (06/12/2023)   [employer] - Occupational Stress Questionnaire    Feeling of Stress : Rather much  Social Connections: Unknown (06/12/2023)   Social Connection and Isolation Panel    Frequency of Communication with Friends and Family: Patient declined    Frequency of Social Gatherings with Friends and Family: Patient declined    Attends Religious Services: Patient declined    Active Member of Clubs or Organizations: Patient declined    Attends Banker Meetings: Not on file    Marital Status: Never married  Intimate Partner Violence: Not At Risk (02/03/2023)   Humiliation, Afraid, Rape, and Kick questionnaire    Fear of  Current or Ex-Partner: No    Emotionally Abused: No    Physically Abused: No    Sexually Abused: No    FAMILY HISTORY: Family History  Problem Relation Age of Onset   Other Mother 101       breast calcifications treated with surgery, breast cancer pill, and radiation   Diabetes Mother    Fibroids Sister 93       s/p TAH-BSO   Diabetes Maternal Grandmother    Heart disease Maternal Grandmother    Hypertension Maternal Grandmother    CAD Maternal Grandmother    Breast cancer Other        maternal great aunt (MGM's sister)   Breast cancer Other        paternal great aunt dx middle ages; s/p mastectomy   Cancer Neg Hx    Alcohol abuse Neg Hx    Early death Neg Hx    Hyperlipidemia Neg Hx    Kidney disease Neg Hx    Stroke Neg Hx    Colon cancer Neg Hx    Esophageal cancer Neg Hx    Stomach cancer Neg Hx    Rectal cancer Neg Hx    Colon polyps Neg Hx     Review of Systems  Constitutional:  Negative for appetite change, chills, fatigue, fever and unexpected weight change.  HENT:   Negative for hearing loss, lump/mass and trouble swallowing.   Eyes:  Negative for eye problems and icterus.  Respiratory:  Negative for chest tightness, cough and shortness of breath.   Cardiovascular:  Negative for chest pain, leg swelling and palpitations.   Gastrointestinal:  Negative for abdominal distention, abdominal pain, constipation, diarrhea, nausea and vomiting.  Endocrine: Negative for hot flashes.  Genitourinary:  Negative for difficulty urinating.   Musculoskeletal:  Positive for back pain (chronic from previous procedure). Negative for arthralgias.  Skin:  Negative for itching and rash.  Neurological:  Negative for dizziness, extremity weakness, headaches and numbness.  Hematological:  Negative for adenopathy. Does not bruise/bleed easily.  Psychiatric/Behavioral:  Negative for depression. The patient is not nervous/anxious.       PHYSICAL EXAMINATION    Vitals:   10/21/23 0911  BP: (!) 185/83  Pulse: 95  Resp: 18  Temp: 98.2 F (36.8 C)  SpO2: 100%    Physical Exam Constitutional:      General: She is not in acute distress.    Appearance: Normal appearance. She is not toxic-appearing.  HENT:     Head: Normocephalic and atraumatic.     Mouth/Throat:     Mouth: Mucous membranes are moist.     Pharynx: Oropharynx is clear. No oropharyngeal exudate or posterior oropharyngeal erythema.  Eyes:     General: No scleral icterus. Cardiovascular:     Rate and Rhythm: Normal rate and regular rhythm.     Pulses: Normal pulses.     Heart sounds: Normal heart sounds.  Pulmonary:     Effort: Pulmonary effort is normal.     Breath sounds: Normal breath sounds.  Chest:     Comments: Right breast is benign, left breast status postlumpectomy and radiation, no sign of local recurrence. Abdominal:     General: Abdomen is flat. Bowel sounds are normal. There is no distension.     Palpations: Abdomen is soft.     Tenderness: There is no abdominal tenderness.  Musculoskeletal:        General: No swelling.     Cervical back: Neck supple.  Lymphadenopathy:  Cervical: No cervical adenopathy.     Upper Body:     Right upper body: No supraclavicular or axillary adenopathy.     Left upper body: No supraclavicular or axillary  adenopathy.  Skin:    General: Skin is warm and dry.     Findings: No rash.  Neurological:     General: No focal deficit present.     Mental Status: She is alert.  Psychiatric:        Mood and Affect: Mood normal.        Behavior: Behavior normal.     LABORATORY DATA:  CBC    Component Value Date/Time   WBC 6.5 10/21/2023 0858   WBC 6.6 12/25/2022 1115   RBC 4.76 10/21/2023 0858   HGB 14.4 10/21/2023 0858   HGB 13.5 11/28/2022 1355   HGB 13.8 03/17/2017 0835   HCT 43.1 10/21/2023 0858   HCT 40.8 11/28/2022 1355   HCT 41.8 03/17/2017 0835   PLT 232 10/21/2023 0858   PLT 213 11/28/2022 1355   MCV 90.5 10/21/2023 0858   MCV 90 11/28/2022 1355   MCV 88.0 03/17/2017 0835   MCH 30.3 10/21/2023 0858   MCHC 33.4 10/21/2023 0858   RDW 13.7 10/21/2023 0858   RDW 13.3 11/28/2022 1355   RDW 14.5 03/17/2017 0835   LYMPHSABS 2.3 10/21/2023 0858   LYMPHSABS 2.3 02/28/2020 0841   LYMPHSABS 1.6 03/17/2017 0835   MONOABS 0.5 10/21/2023 0858   MONOABS 0.3 03/17/2017 0835   EOSABS 0.2 10/21/2023 0858   EOSABS 0.2 02/28/2020 0841   BASOSABS 0.0 10/21/2023 0858   BASOSABS 0.0 02/28/2020 0841   BASOSABS 0.1 03/17/2017 0835    CMP     Component Value Date/Time   NA 143 10/21/2023 0858   NA 144 09/25/2023 1440   NA 142 03/17/2017 0835   K 3.6 10/21/2023 0858   K 3.6 03/17/2017 0835   CL 108 10/21/2023 0858   CL 103 09/10/2012 1618   CO2 30 10/21/2023 0858   CO2 27 03/17/2017 0835   GLUCOSE 117 (H) 10/21/2023 0858   GLUCOSE 114 03/17/2017 0835   GLUCOSE 86 09/10/2012 1618   BUN 8 10/21/2023 0858   BUN 13 09/25/2023 1440   BUN 10.1 03/17/2017 0835   CREATININE 0.94 10/21/2023 0858   CREATININE 0.9 03/17/2017 0835   CALCIUM  9.3 10/21/2023 0858   CALCIUM  9.5 03/17/2017 0835   PROT 7.9 10/21/2023 0858   PROT 7.4 09/25/2023 1440   PROT 7.7 03/17/2017 0835   ALBUMIN 4.2 10/21/2023 0858   ALBUMIN 4.4 09/25/2023 1440   ALBUMIN 3.7 03/17/2017 0835   AST 31 10/21/2023 0858    AST 17 03/17/2017 0835   ALT 31 10/21/2023 0858   ALT 24 03/17/2017 0835   ALKPHOS 70 10/21/2023 0858   ALKPHOS 77 03/17/2017 0835   BILITOT 0.5 10/21/2023 0858   BILITOT 0.50 03/17/2017 0835   GFRNONAA >60 10/21/2023 0858   GFRAA 72 04/10/2020 0942   GFRAA 59 (L) 12/09/2018 1222     ASSESSMENT and THERAPY PLAN:   Malignant neoplasm of upper-outer quadrant of left breast in female, estrogen receptor negative (HCC) Erica [name] is a 48 year old woman with history of left-sided stage IIb triple negative breast cancer diagnosed in 2010 status post neoadjuvant chemotherapy, adjuvant chemotherapy, and adjuvant radiation to the left breast.  Assessment and Plan Assessment & Plan History of Breast Cancer. No clinical or radiographic sign of breast cancer recurrence. - Continue annual mammogram  - Healthy diet and exercise  discussed.   Heavy menstrual bleeding Chronic heavy menstrual bleeding managed with Zoladex . Concerns about long-term use due to side effects. Previous cessation attempts led to hemorrhaging. - Schedule Zoladex  injection. - Consult with GYN for alternative treatments such as ablation or hysterectomy. - Continue Zoladex  until she returns and we can discuss GYN recommendations.  Hypertension Blood pressure improved but not optimal. Discussed Zoladex 's potential impact on blood pressure. Encouraged dietary changes. - Encourage dietary modifications to include more whole foods and less processed meats and fried foods.  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 Discussed benefits of chair exercises and impact of prolonged sitting. - Continue physical therapy. - Consider chair and core strengthening exercises.   RTC every 4 weeks for injection and f/u in 24 weeks prior to injection.      All questions were answered. The patient knows to call the clinic with any problems, questions or concerns. We  can certainly see the patient much sooner if necessary.  Total encounter time: 30 minutes*in face-to-face visit time, chart review, lab review, care coordination, order entry, and documentation of the encounter time.    Morna Kendall, NP 10/21/23 11:40 AM Medical Oncology and Hematology [facility] [address] [place], [place] [address] Tel. [phone]    Fax. [phone]  *Total Encounter Time as defined by the Centers for Medicare and Medicaid Services includes, in addition to the face-to-face time of a patient visit (documented in the note above) non-face-to-face time: obtaining and reviewing outside history, ordering and reviewing medications, tests or procedures, care coordination (communications with other health care professionals or caregivers) and documentation in the medical record.

## 2023-10-25 ENCOUNTER — Other Ambulatory Visit: Payer: Self-pay | Admitting: Internal Medicine

## 2023-10-26 ENCOUNTER — Other Ambulatory Visit (HOSPITAL_BASED_OUTPATIENT_CLINIC_OR_DEPARTMENT_OTHER): Payer: Self-pay | Admitting: Cardiovascular Disease

## 2023-10-26 ENCOUNTER — Other Ambulatory Visit (HOSPITAL_BASED_OUTPATIENT_CLINIC_OR_DEPARTMENT_OTHER): Payer: Self-pay | Admitting: Family

## 2023-10-26 DIAGNOSIS — I119 Hypertensive heart disease without heart failure: Secondary | ICD-10-CM

## 2023-10-26 DIAGNOSIS — I2510 Atherosclerotic heart disease of native coronary artery without angina pectoris: Secondary | ICD-10-CM

## 2023-10-26 DIAGNOSIS — E785 Hyperlipidemia, unspecified: Secondary | ICD-10-CM

## 2023-10-26 DIAGNOSIS — G8929 Other chronic pain: Secondary | ICD-10-CM

## 2023-10-26 DIAGNOSIS — R072 Precordial pain: Secondary | ICD-10-CM

## 2023-10-27 ENCOUNTER — Other Ambulatory Visit: Payer: Self-pay

## 2023-10-27 ENCOUNTER — Ambulatory Visit (INDEPENDENT_AMBULATORY_CARE_PROVIDER_SITE_OTHER): Admitting: Physical Therapy

## 2023-10-27 ENCOUNTER — Encounter: Payer: Self-pay | Admitting: Physical Therapy

## 2023-10-27 DIAGNOSIS — M5459 Other low back pain: Secondary | ICD-10-CM

## 2023-10-27 DIAGNOSIS — M6281 Muscle weakness (generalized): Secondary | ICD-10-CM

## 2023-10-27 DIAGNOSIS — M79604 Pain in right leg: Secondary | ICD-10-CM | POA: Diagnosis not present

## 2023-10-27 NOTE — Therapy (Signed)
 OUTPATIENT PHYSICAL THERAPY TREATMENT    Patient Name: Erica [name] MRN: [id number] DOB:1975/12/02, 48 y.o., female Today's Date: 10/27/2023   END OF SESSION:  PT End of Session - 10/27/23 1123     Visit Number 28    Number of Visits 39    Date for PT Re-Evaluation 12/01/23    Authorization Type [employer] MCR Dual Complete    Progress Note Due on Visit 30    PT Start Time 1105    PT Stop Time 1145    PT Time Calculation (min) 40 min    Activity Tolerance Patient tolerated treatment well    Behavior During Therapy WFL for tasks assessed/performed                                     Past Medical History:  Diagnosis Date   Allergy     Anxiety    Apnea, sleep 05/04/2014   AHI 31/hr now on CPAP at 12cm H2O   Arthritis    Asthma    Asthma without status asthmaticus 01/19/2016   Axillary hidradenitis suppurativa 08/01/2011   Blood transfusion without reported diagnosis    Breast cancer (HCC) 2010   a. locally advanced left breast carcinoma diagnosed in 2010 and treated with neoadjuvant chemotherapy with docetaxel and cyclophosphamide as well as paclitaxel. This was followed by radiation therapy which was completed in 2011.   Chemotherapy-induced peripheral neuropathy (HCC) 12/09/2016   CHF (congestive heart failure) (HCC)    Chronic diastolic CHF (congestive heart failure) (HCC) 06/23/2013   Chronic kidney disease    Chronic pain in left foot 06/29/2018   Chronic stable angina (HCC) 08/22/2020   Cough variant asthma 09/27/2010   Followed in Pulmonary clinic/ [facility]/ [place]  Onset reported in childhood  - PFT's 01/31/2011 FEV1  1.76 (60% ) ratio 68 and no better p B2,  DLCO 70% -med review 09/25/2012 > [place]  drug assistance  paperwork given    - HFA 90% 11/20/2012 .  -Med calendar 12/07/12 , 04/06/2013 , 04/18/2014,, 08/30/2014 , 11/08/2014  - PFT's 01/12/2013  1.73 (65%)  Ratio 73 and no better p B2, DLCO 86% - trial off    Depression with somatization  06/15/2015   Dyspnea on exertion    a. 01/2013 Lexi MV: EF 54%, no ischemia/infarct;  b. 05/2013 Echo: EF 60-65%, no rwma, Gr 2 DD;  b.    Essential hypertension 10/09/2014   Estimated Creatinine Clearance: 82.4 mL/min (by C-G formula based on SCr of 0.98 mg/dL).   Fatty liver    Gastroparesis    GERD (gastroesophageal reflux disease)    Hyperglycemia 04/13/2011   Hypertension    Hypertensive heart disease 09/26/2015   Hypertriglyceridemia without hypercholesterolemia 06/15/2015   Framingham risk score is 1%    Idiopathic intracranial hypertension 10/21/2014   Impaired glucose tolerance 04/13/2011   Insomnia secondary to anxiety 04/05/2015   Lymphadenitis, chronic    restricted LEFT extremity   Malignant neoplasm of upper-outer quadrant of left breast in female, estrogen receptor negative (HCC) 01/07/2013   Stage III, Invasive Ductal, s/p partial Left Mastectomy/Lumpectomy (baseball sized) with Lymph Node dissection, had Chemo and RadRx     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   OSA (obstructive sleep apnea) 05/04/2014   AHI 31/hr now on CPAP at 12cm H2O   Oxygen deficiency    Uses 02 in CPAP   Persistent proteinuria 03/31/2018   Personal  history of chemotherapy 2010   Personal history of radiation therapy 2010   Resistant hypertension 09/17/2011       Sleep apnea    SVT (supraventricular tachycardia) (HCC) 08/22/2020   Tibialis posterior tendinitis, left 07/15/2018   Injected in March 10, 2019   Ulcer    Upper airway cough syndrome 10/09/2014   Onset around 2012 with assoc pseudowheeze MRI 05/18/15 > Paranasal sinuses are clear - 11/17/2015 rec increase h1 to 2 hs to help with noct cough  - Allergy  profile 11/17/2015 >  Eos 0.1 /  IgE  80 POS RAST  to grass, ragweed, cedar tree  - d/c acei 03/06/2018  - flare end of Jan 2020 in setting of ? Uri/ poorly controlled GERD - flare mid Oct 2020 with uri / assoc secondary gerd   Vitamin D  deficiency disease 03/31/2018   Past Surgical  History:  Procedure Laterality Date   BRAIN SURGERY     Shunt   BREAST LUMPECTOMY Left 09/2008   CARPAL TUNNEL RELEASE Left 2011   LYMPH NODE DISSECTION Left 2010   breast; 2 wks after breast lumpectomy   RENAL DENERVATION N/A 11/20/2022   Procedure: RENAL DENERVATION;  Surgeon: Darron Deatrice LABOR, MD;  Location: [facility] INVASIVE [facility];  Service: Cardiovascular;  Laterality: N/A;   RIGHT/LEFT HEART CATH AND CORONARY ANGIOGRAPHY N/A 05/04/2019   Procedure: RIGHT/LEFT HEART CATH AND CORONARY ANGIOGRAPHY;  Surgeon: Cherrie Toribio SAUNDERS, MD;  Location: [facility];  Service: Cardiovascular;  Laterality: N/A;  Renal injection preformed    RIGHT/LEFT HEART CATH AND CORONARY ANGIOGRAPHY N/A 10/25/2020   Procedure: RIGHT/LEFT HEART CATH AND CORONARY ANGIOGRAPHY;  Surgeon: Anner Alm ORN, MD;  Location: [facility] INVASIVE [facility];  Service: Cardiovascular;  Laterality: N/A;   TENDON RELEASE Right 2012   hand   TUBAL LIGATION  05/11/2011   Procedure: POST PARTUM TUBAL LIGATION;  Surgeon: Lynwood Solomons, MD;  Location: [facility];  Service: Gynecology;  Laterality: Bilateral;  Induced for HTN   Patient Active Problem List   Diagnosis Date Noted   Prediabetes 06/13/2023   Moderate episode of recurrent major depressive disorder (HCC) 02/04/2023   GAD (generalized anxiety disorder) 02/04/2023   Chronic respiratory failure with hypoxia (HCC) 12/31/2021   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   Coronary artery disease    Chronic stable angina (HCC) 08/22/2020   SVT (supraventricular tachycardia) (HCC) 08/22/2020   Palpitations 02/28/2020   Right ankle pain 04/14/2019   OSA (obstructive sleep apnea) 10/01/2018   Tibialis posterior tendinitis, left 07/15/2018   Chronic pain in left foot 06/29/2018   Vitamin D  deficiency disease 03/31/2018   Persistent proteinuria 03/31/2018   Hyperlipidemia LDL goal <100 03/30/2018   Chemotherapy-induced peripheral neuropathy (HCC) 12/09/2016   Asthma in adult, mild persistent, uncomplicated  01/19/2016   Hypertensive heart disease 09/26/2015   Depression with somatization 06/15/2015   Hypertriglyceridemia without hypercholesterolemia 06/15/2015   Hypokalemia 06/15/2015   Insomnia secondary to anxiety 04/05/2015   Idiopathic intracranial hypertension 10/21/2014   Upper airway cough syndrome 10/09/2014   Malignant hypertension 10/09/2014   Morbid obesity due to excess calories (HCC) 10/09/2014   Apnea, sleep 05/04/2014   Acute on chronic diastolic CHF (congestive heart failure), NYHA class 2 (HCC) 05/03/2014   DOE (dyspnea on exertion) 07/14/2013   Acute on chronic diastolic heart failure (HCC) 06/23/2013   Malignant neoplasm of upper-outer quadrant of left breast in female, estrogen receptor negative (HCC) 01/07/2013   Gastroparesis 01/04/2013   Resistant hypertension 09/17/2011  Axillary hidradenitis suppurativa 08/01/2011   Preventative health care 04/13/2011   Hyperglycemia 04/13/2011   GERD (gastroesophageal reflux disease) 03/18/2011   Cough variant asthma 09/27/2010    PCP: Knute Thersia Bitters, FNP  REFERRING PROVIDER: Joane Artist RAMAN, MD  REFERRING DIAG: Lumbar radiculopathy  Rationale for Evaluation and Treatment: Rehabilitation  THERAPY DIAG:  Other low back pain  Pain in right leg  Muscle weakness (generalized)  ONSET DATE: August 2024   SUBJECTIVE:     SUBJECTIVE STATEMENT: Patient reports she has difficulty standing long periods because of her back.  Eval: Patients reports she had renal denervation done in August 2024 and has been in pain ever since. Pain is located across the lower back, she was told it would hurt for 3-4 months but pain persisted in the lower back. The right knee pain started after in October or November, and it will keep her up at night. She states it feels heavy in her lower back, like labor pain that never stops. The pain will start in the right lower back region and then will feel like lightning shooting down the right leg  to the back and outside of the right knee, pain does not go past the knee. She leans on the bed for relief, and she states getting on her stomach gives her the most relief, but she has a lot of pain when getting up. She does use a heating pad as well. She reports that to help with the knee pain at night she has to have her leg up on the wall. She states she moves around a lot because sitting or standing for long periods will make it worse. Will occasionally have trouble bending down and she states the right leg has given out on her causing her to fall. She does have trouble going up/down stairs, she turns to the side when going down, and she also has breathing issues so when she goes up stairs to get inside she has to immediately get on oxygen. She reports inability to play with or lift her grandchildren and difficulty completing activities around her home for her son.  PERTINENT HISTORY:  See PMH above  PAIN:  Are you having pain? Yes:  NPRS scale: 5/10 currently, 10/10 at worst Pain location: Lower back and right leg/knee Pain description: coming apart Aggravating factors: Standing, sitting, activity Relieving factors: Lying on stomach, leaning on bed or counter, heat  PRECAUTIONS: Fall, patient reports she was told not to lift more than 10 lbs  FALLS:  Has patient fallen in last 6 months? Yes. Number of falls 3-4, right knee gives out or sharp pains could cause her to fall  PATIENT GOALS: Pain relief, get back to doing normal things   OBJECTIVE:  Note: Objective measures were completed at Evaluation unless otherwise noted. PATIENT SURVEYS:  Modified Oswestry 37/50 (74% disability)   08/29/2023: 33/50 (66%)  10/06/2023: 31/50 (62%) 10/20/2023: 30/50 (60%)  SENSATION: Patient reports neuropathy of feet and hands  MUSCLE LENGTH: Unable to fully assess, severe increase in right lower back and leg pain when attempting to assess hamstring length  POSTURE:  Rounded shoulder posture,  normal lumbar lordosis, weight shift toward left and patient frequently changing positions while seated for comfort  PALPATION: Exquisite tenderness to right lower lumbar paraspinals, right gluteal and TFL region, right lateral thigh and knee; moderate tenderness to left lumbar paraspinals  Unable to assess lumbar mobility  LUMBAR ROM:   AROM eval  Flexion   Extension  Right lateral flexion   Left lateral flexion   Right rotation   Left rotation    (Blank rows = not tested)  LOWER EXTREMITY ROM:    Limitations in right hip ROM due to pain and guarding, right knee flexion limited due to pain  LOWER EXTREMITY MMT:    MMT Right eval Left eval Rt / Lt 07/08/2023 Right 07/22/2023 Right 07/31/2023 Rt / Lt 08/06/2023 Rt / Lt 08/29/2023 Rt / Lt 10/20/2023  Hip flexion 3+ 4        Hip extension 2 3- 3 / 3   3 / 3+ 3 / 3+ 3+ / 3+  Hip abduction 2     3 / 3+ 3 / 3+ 3+ / 3+  Hip adduction          Hip internal rotation          Hip external rotation          Knee flexion 4- 4  4      Knee extension 4- 4+  4 4 4 4  / 5 4 / 5  Ankle dorsiflexion          Ankle plantarflexion          Ankle inversion          Ankle eversion           (Blank rows = not tested)  LUMBAR SPECIAL TESTS:  SLR positive on right  FUNCTIONAL TESTS:  Sit to stand: patient will difficulty standing from standard chair, requires UE for support with transferring from sit to stand 08/06/2023: patient able to stand from standard chair without support  GAIT: Assistive device utilized: None Level of assistance: Complete Independence Comments: Antalgic on right   TREATMENT [facility] Adult PT Treatment:                                                DATE: 10/20/2023 Recumbent bike L4 x 5 min to improve endurance and workload capacity Sit to stand 3 x 10 Seated pelvic tilt 2 x 10 Standing hip abduction and extension with yellow at knees 3 x 10 on right LAQ with 5# 3 x 10 each Longsitting 4-way ankle with yellow 2 x 10  on right Deadlift from 8 box with 20# 3 x 5  PATIENT EDUCATION:  Education details: HEP Person educated: Patient Education method: Explanation, Demonstration, Tactile cues, Verbal cues Education comprehension: verbalized understanding, returned demonstration, verbal cues required, tactile cues required, and needs further education  HOME EXERCISE PROGRAM: Access Code: [email or web address]    ASSESSMENT: CLINICAL IMPRESSION: Patient tolerated therapy well with no adverse effects. Therapy focused on continued strengthening for her back, hips, and LE. She was able to progress back to weight bearing exercises this visit and did not use the CAM boot, but did avoid any SL stance on the right. Continued with some right ankle banded strengthening. She does report standing limitation due to back pain so incorporated more back strengthening this visit with dealifts. No changes to her HEP this visit. Patient would benefit from continued skilled PT to progress mobility and strength in order to reduce pain and maximize functional ability.   Eval: Patient is a 48 y.o. female who was seen today for physical therapy evaluation and treatment for chronic lower back and right knee pain. Her symptoms do seem consistent with nonspecific lower  back pain and right sided lumbar radiculopathy. Evaluation was limited this visit due to patient's high symptom severity and irritability level. She did become tearful during the evaluation. Trial of e-stim IFC combined with cold pack with patient reporting pain level reduction, and provided patient with information for purchasing home TENS unit for pain relief. Unable to provide patient with HEP this visit due to inability to complete exercise without severe pain. Patient was provided extensive education on possible etiologies of her symptoms and treatments that could be utilized in therapy, and gradual progression for return to activity.   OBJECTIVE IMPAIRMENTS: Abnormal gait, decreased  activity tolerance, decreased balance, decreased ROM, decreased strength, impaired flexibility, postural dysfunction, and pain.   ACTIVITY LIMITATIONS: carrying, lifting, bending, sitting, standing, squatting, sleeping, stairs, transfers, bed mobility, locomotion level, and caring for others  PARTICIPATION LIMITATIONS: meal prep, cleaning, laundry, driving, shopping, and community activity  PERSONAL FACTORS: Fitness, Past/current experiences, Time since onset of injury/illness/exacerbation, and 3+ comorbidities: see PMH above are also affecting patient's functional outcome.    GOALS: Goals reviewed with patient? Yes  SHORT TERM GOALS: Target date: 09/26/2023  Patient will be I with initial HEP in order to progress with therapy. Baseline: None provided at eval 07/28/2023: patient reports independence with HEP Goal status: MET  2.  Patient will report low back and right leg pain </= 7/10 at worst to reduce functional limitations Baseline: 10/10 pain at worst 07/29/2023: reports pain at worst still 10/10 08/06/2023: continues to report pain 10/10 at worst 08/29/2023: continues to report pain 10/10 at worst 09/16/2023: continues to report pain 10/10 at worst 10/20/2023: continues to report pain 10/10 at worst Goal status: ONGOING  3.  Patient will be able to perform sit to stand without difficulty or using UE for support to improve mobility and indicate improvement in LE strength Baseline: requires UE for support with standing from chair 07/29/2023: able to perform sit to stand without UE support but exhibits difficulty  08/06/2023: able to perform sit to stand without UE support but exhibits difficulty due to knee pain 08/29/2023: patient can perform sit to stand without difficulty Goal status: MET  LONG TERM GOALS: Target date: 12/01/2023  Patient will be I with final HEP to maintain progress from PT. Baseline: None provided at eval 08/06/2023: progressing 08/29/2023: progressing 10/20/2023:  progressing Goal status: ONGOING  2.  Patient will report Modified Oswestry </= 25/50 (50% disability) in order to indicate an improvement in their functional status Baseline: 37/50 (74% disability) 08/06/2023: not assessed 08/29/2023: 33/50 (66%) 10/06/2023: 31/50 (62%) 10/20/2023: 30/50 (60%) Goal status: ONGOING  3.  Patient will demonstrate improvement in core and hip strength grossly >/= 4-/5 MMT in order to improve her activity tolerance with household tasks Baseline: see limitations above 08/06/2023: see limitations above 08/29/2023: see limitations above 10/20/2023: see limitations above Goal status: ONGOING  4.  Patient will demonstrate right knee strength >/= 4+/5 MMT in order to improve stair negotiation and reduce fall risk due to right knee giving out Baseline: see limitations above 08/06/2023: see limitations above 08/29/2023: see limitations above 10/20/2023: see limitations above Goal status: ONGOING  5. Patient will report lower back and right leg pain </= 4/10 in order to reduce functional limitations  Baseline: 10/10 at worst  08/06/2023: continues to report pain 10/10 at worst 08/29/2023: continues to report pain 10/10 at worst 09/16/2023: continues to report pain 10/10 at worst 10/20/2023: continues to report pain 10/10 at worst  Goal status: ONGOING   PLAN: PT  FREQUENCY: 1-2x/week  PT DURATION: 6 weeks  PLANNED INTERVENTIONS: 97164- PT Re-evaluation, 97110-Therapeutic exercises, 97530- Therapeutic activity, 97112- Neuromuscular re-education, 97535- Self Care, [id number]- Manual therapy, [id number]- Electrical stimulation (unattended), [phone]- Electrical stimulation (manual), Patient/Family education, Balance training, Taping, Dry Needling, Joint mobilization, Joint manipulation, Spinal manipulation, Spinal mobilization, Cryotherapy, and Moist heat.  PLAN FOR NEXT SESSION: Review HEP and progress PRN, continue with modalities for pain relief as needed, manual/TPDN for right lumbar and  gluteal region, LAD, gentle stretching and mobility for lumbar, gentle core activation, hip and LE strengthening as tolerated    Elaine Daring, PT, DPT, LAT, ATC 10/27/23  11:45 AM Phone: [phone] Fax: [phone]

## 2023-10-30 ENCOUNTER — Encounter: Payer: Self-pay | Admitting: Physical Therapy

## 2023-10-30 ENCOUNTER — Ambulatory Visit (INDEPENDENT_AMBULATORY_CARE_PROVIDER_SITE_OTHER): Admitting: Physical Therapy

## 2023-10-30 ENCOUNTER — Other Ambulatory Visit: Payer: Self-pay

## 2023-10-30 DIAGNOSIS — M5459 Other low back pain: Secondary | ICD-10-CM

## 2023-10-30 DIAGNOSIS — M79604 Pain in right leg: Secondary | ICD-10-CM

## 2023-10-30 DIAGNOSIS — M6281 Muscle weakness (generalized): Secondary | ICD-10-CM

## 2023-10-30 NOTE — Therapy (Addendum)
 " OUTPATIENT PHYSICAL THERAPY TREATMENT  DISCHARGE    Patient Name: Erica [name] MRN: [id number] DOB:1975-09-19, 48 y.o., female Today's Date: 10/30/2023   END OF SESSION:  PT End of Session - 10/30/23 1123     Visit Number 29    Number of Visits 39    Date for PT Re-Evaluation 12/01/23    Authorization Type [employer] MCR Dual Complete    Progress Note Due on Visit 30    PT Start Time 1105    PT Stop Time 1145    PT Time Calculation (min) 40 min    Activity Tolerance Patient tolerated treatment well    Behavior During Therapy WFL for tasks assessed/performed                                      Past Medical History:  Diagnosis Date   Allergy     Anxiety    Apnea, sleep 05/04/2014   AHI 31/hr now on CPAP at 12cm H2O   Arthritis    Asthma    Asthma without status asthmaticus 01/19/2016   Axillary hidradenitis suppurativa 08/01/2011   Blood transfusion without reported diagnosis    Breast cancer (HCC) 2010   a. locally advanced left breast carcinoma diagnosed in 2010 and treated with neoadjuvant chemotherapy with docetaxel and cyclophosphamide as well as paclitaxel. This was followed by radiation therapy which was completed in 2011.   Chemotherapy-induced peripheral neuropathy (HCC) 12/09/2016   CHF (congestive heart failure) (HCC)    Chronic diastolic CHF (congestive heart failure) (HCC) 06/23/2013   Chronic kidney disease    Chronic pain in left foot 06/29/2018   Chronic stable angina (HCC) 08/22/2020   Cough variant asthma 09/27/2010   Followed in Pulmonary clinic/ [facility]/ [place]  Onset reported in childhood  - PFT's 01/31/2011 FEV1  1.76 (60% ) ratio 68 and no better p B2,  DLCO 70% -med review 09/25/2012 > [place]  drug assistance  paperwork given    - HFA 90% 11/20/2012 .  -Med calendar 12/07/12 , 04/06/2013 , 04/18/2014,, 08/30/2014 , 11/08/2014  - PFT's 01/12/2013  1.73 (65%)  Ratio 73 and no better p B2, DLCO 86% - trial off    Depression with  somatization 06/15/2015   Dyspnea on exertion    a. 01/2013 Lexi MV: EF 54%, no ischemia/infarct;  b. 05/2013 Echo: EF 60-65%, no rwma, Gr 2 DD;  b.    Essential hypertension 10/09/2014   Estimated Creatinine Clearance: 82.4 mL/min (by C-G formula based on SCr of 0.98 mg/dL).   Fatty liver    Gastroparesis    GERD (gastroesophageal reflux disease)    Hyperglycemia 04/13/2011   Hypertension    Hypertensive heart disease 09/26/2015   Hypertriglyceridemia without hypercholesterolemia 06/15/2015   Framingham risk score is 1%    Idiopathic intracranial hypertension 10/21/2014   Impaired glucose tolerance 04/13/2011   Insomnia secondary to anxiety 04/05/2015   Lymphadenitis, chronic    restricted LEFT extremity   Malignant neoplasm of upper-outer quadrant of left breast in female, estrogen receptor negative (HCC) 01/07/2013   Stage III, Invasive Ductal, s/p partial Left Mastectomy/Lumpectomy (baseball sized) with Lymph Node dissection, had Chemo and RadRx     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   OSA (obstructive sleep apnea) 05/04/2014   AHI 31/hr now on CPAP at 12cm H2O   Oxygen deficiency    Uses 02 in CPAP   Persistent proteinuria  03/31/2018   Personal history of chemotherapy 2010   Personal history of radiation therapy 2010   Resistant hypertension 09/17/2011       Sleep apnea    SVT (supraventricular tachycardia) (HCC) 08/22/2020   Tibialis posterior tendinitis, left 07/15/2018   Injected in March 10, 2019   Ulcer    Upper airway cough syndrome 10/09/2014   Onset around 2012 with assoc pseudowheeze MRI 05/18/15 > Paranasal sinuses are clear - 11/17/2015 rec increase h1 to 2 hs to help with noct cough  - Allergy  profile 11/17/2015 >  Eos 0.1 /  IgE  80 POS RAST  to grass, ragweed, cedar tree  - d/c acei 03/06/2018  - flare end of Jan 2020 in setting of ? Uri/ poorly controlled GERD - flare mid Oct 2020 with uri / assoc secondary gerd   Vitamin D  deficiency disease 03/31/2018    Past Surgical History:  Procedure Laterality Date   BRAIN SURGERY     Shunt   BREAST LUMPECTOMY Left 09/2008   CARPAL TUNNEL RELEASE Left 2011   LYMPH NODE DISSECTION Left 2010   breast; 2 wks after breast lumpectomy   RENAL DENERVATION N/A 11/20/2022   Procedure: RENAL DENERVATION;  Surgeon: Darron Deatrice LABOR, MD;  Location: [facility] INVASIVE CV LAB;  Service: Cardiovascular;  Laterality: N/A;   RIGHT/LEFT HEART CATH AND CORONARY ANGIOGRAPHY N/A 05/04/2019   Procedure: RIGHT/LEFT HEART CATH AND CORONARY ANGIOGRAPHY;  Surgeon: Cherrie Toribio SAUNDERS, MD;  Location: [facility] INVASIVE CV [facility];  Service: Cardiovascular;  Laterality: N/A;  Renal injection preformed    RIGHT/LEFT HEART CATH AND CORONARY ANGIOGRAPHY N/A 10/25/2020   Procedure: RIGHT/LEFT HEART CATH AND CORONARY ANGIOGRAPHY;  Surgeon: Anner Alm ORN, MD;  Location: [facility] INVASIVE CV LAB;  Service: Cardiovascular;  Laterality: N/A;   TENDON RELEASE Right 2012   hand   TUBAL LIGATION  05/11/2011   Procedure: POST PARTUM TUBAL LIGATION;  Surgeon: Lynwood Solomons, MD;  Location: [facility];  Service: Gynecology;  Laterality: Bilateral;  Induced for HTN   Patient Active Problem List   Diagnosis Date Noted   Prediabetes 06/13/2023   Moderate episode of recurrent major depressive disorder (HCC) 02/04/2023   GAD (generalized anxiety disorder) 02/04/2023   Chronic respiratory failure with hypoxia (HCC) 12/31/2021   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   Coronary artery disease    Chronic stable angina (HCC) 08/22/2020   SVT (supraventricular tachycardia) (HCC) 08/22/2020   Palpitations 02/28/2020   Right ankle pain 04/14/2019   OSA (obstructive sleep apnea) 10/01/2018   Tibialis posterior tendinitis, left 07/15/2018   Chronic pain in left foot 06/29/2018   Vitamin D  deficiency disease 03/31/2018   Persistent proteinuria 03/31/2018   Hyperlipidemia LDL goal <100 03/30/2018   Chemotherapy-induced peripheral neuropathy (HCC) 12/09/2016   Asthma in adult, mild  persistent, uncomplicated 01/19/2016   Hypertensive heart disease 09/26/2015   Depression with somatization 06/15/2015   Hypertriglyceridemia without hypercholesterolemia 06/15/2015   Hypokalemia 06/15/2015   Insomnia secondary to anxiety 04/05/2015   Idiopathic intracranial hypertension 10/21/2014   Upper airway cough syndrome 10/09/2014   Malignant hypertension 10/09/2014   Morbid obesity due to excess calories (HCC) 10/09/2014   Apnea, sleep 05/04/2014   Acute on chronic diastolic CHF (congestive heart failure), NYHA class 2 (HCC) 05/03/2014   DOE (dyspnea on exertion) 07/14/2013   Acute on chronic diastolic heart failure (HCC) 06/23/2013   Malignant neoplasm of upper-outer quadrant of left breast in female, estrogen receptor negative (HCC) 01/07/2013   Gastroparesis 01/04/2013   Resistant  hypertension 09/17/2011   Axillary hidradenitis suppurativa 08/01/2011   Preventative health care 04/13/2011   Hyperglycemia 04/13/2011   GERD (gastroesophageal reflux disease) 03/18/2011   Cough variant asthma 09/27/2010    PCP: Knute Thersia Bitters, FNP  REFERRING PROVIDER: Joane Artist RAMAN, MD  REFERRING DIAG: Lumbar radiculopathy  Rationale for Evaluation and Treatment: Rehabilitation  THERAPY DIAG:  Other low back pain  Pain in right leg  Muscle weakness (generalized)  ONSET DATE: August 2024   SUBJECTIVE:     SUBJECTIVE STATEMENT: Patient reports she has difficulty standing long periods because of her back.  Eval: Patients reports she had renal denervation done in August 2024 and has been in pain ever since. Pain is located across the lower back, she was told it would hurt for 3-4 months but pain persisted in the lower back. The right knee pain started after in October or November, and it will keep her up at night. She states it feels heavy in her lower back, like labor pain that never stops. The pain will start in the right lower back region and then will feel like lightning  shooting down the right leg to the back and outside of the right knee, pain does not go past the knee. She leans on the bed for relief, and she states getting on her stomach gives her the most relief, but she has a lot of pain when getting up. She does use a heating pad as well. She reports that to help with the knee pain at night she has to have her leg up on the wall. She states she moves around a lot because sitting or standing for long periods will make it worse. Will occasionally have trouble bending down and she states the right leg has given out on her causing her to fall. She does have trouble going up/down stairs, she turns to the side when going down, and she also has breathing issues so when she goes up stairs to get inside she has to immediately get on oxygen. She reports inability to play with or lift her grandchildren and difficulty completing activities around her home for her son.  PERTINENT HISTORY:  See PMH above  PAIN:  Are you having pain? Yes:  NPRS scale: 5/10 currently, 10/10 at worst Pain location: Lower back and right leg/knee Pain description: coming apart Aggravating factors: Standing, sitting, activity Relieving factors: Lying on stomach, leaning on bed or counter, heat  PRECAUTIONS: Fall, patient reports she was told not to lift more than 10 lbs  FALLS:  Has patient fallen in last 6 months? Yes. Number of falls 3-4, right knee gives out or sharp pains could cause her to fall  PATIENT GOALS: Pain relief, get back to doing normal things   OBJECTIVE:  Note: Objective measures were completed at Evaluation unless otherwise noted. PATIENT SURVEYS:  Modified Oswestry 37/50 (74% disability)   08/29/2023: 33/50 (66%)  10/06/2023: 31/50 (62%) 10/20/2023: 30/50 (60%)  SENSATION: Patient reports neuropathy of feet and hands  MUSCLE LENGTH: Unable to fully assess, severe increase in right lower back and leg pain when attempting to assess hamstring length  POSTURE:   Rounded shoulder posture, normal lumbar lordosis, weight shift toward left and patient frequently changing positions while seated for comfort  PALPATION: Exquisite tenderness to right lower lumbar paraspinals, right gluteal and TFL region, right lateral thigh and knee; moderate tenderness to left lumbar paraspinals  Unable to assess lumbar mobility  LUMBAR ROM:   AROM eval  Flexion  Extension   Right lateral flexion   Left lateral flexion   Right rotation   Left rotation    (Blank rows = not tested)  LOWER EXTREMITY ROM:    Limitations in right hip ROM due to pain and guarding, right knee flexion limited due to pain  LOWER EXTREMITY MMT:    MMT Right eval Left eval Rt / Lt 07/08/2023 Right 07/22/2023 Right 07/31/2023 Rt / Lt 08/06/2023 Rt / Lt 08/29/2023 Rt / Lt 10/20/2023  Hip flexion 3+ 4        Hip extension 2 3- 3 / 3   3 / 3+ 3 / 3+ 3+ / 3+  Hip abduction 2     3 / 3+ 3 / 3+ 3+ / 3+  Hip adduction          Hip internal rotation          Hip external rotation          Knee flexion 4- 4  4      Knee extension 4- 4+  4 4 4 4  / 5 4 / 5  Ankle dorsiflexion          Ankle plantarflexion          Ankle inversion          Ankle eversion           (Blank rows = not tested)  LUMBAR SPECIAL TESTS:  SLR positive on right  FUNCTIONAL TESTS:  Sit to stand: patient will difficulty standing from standard chair, requires UE for support with transferring from sit to stand 08/06/2023: patient able to stand from standard chair without support  GAIT: Assistive device utilized: None Level of assistance: Complete Independence Comments: Antalgic on right   TREATMENT [facility] Adult PT Treatment:                                                DATE: 10/20/2023 Recumbent bike L4 x 5 min to improve endurance and workload capacity Sit to stand 3 x 10 Seated pelvic tilt 2 x 10 Longsitting 4-way ankle with yellow 2 x 10 on right TRX squat 3 x 5 Deadlift from 8 box with 20# 2 x 5  PATIENT  EDUCATION:  Education details: HEP Person educated: Patient Education method: Explanation, Demonstration, Tactile cues, Verbal cues Education comprehension: verbalized understanding, returned demonstration, verbal cues required, tactile cues required, and needs further education  HOME EXERCISE PROGRAM: Access Code: [email or web address]    ASSESSMENT: CLINICAL IMPRESSION: Patient tolerated therapy well with no adverse effects. Therapy continues to focus primarily on progressing hip back and LE strengthening this visit with good tolerance. She does report an increase in back and right knee pain with deadlift but she is able to complete the exercise with good technique. Overall she does seem to be progressing with the strengthening exercises. No changes to her HEP this visit. Patient would benefit from continued skilled PT to progress mobility and strength in order to reduce pain and maximize functional ability.   Eval: Patient is a 48 y.o. female who was seen today for physical therapy evaluation and treatment for chronic lower back and right knee pain. Her symptoms do seem consistent with nonspecific lower back pain and right sided lumbar radiculopathy. Evaluation was limited this visit due to patient's high symptom severity and irritability level. She did become tearful  during the evaluation. Trial of e-stim IFC combined with cold pack with patient reporting pain level reduction, and provided patient with information for purchasing home TENS unit for pain relief. Unable to provide patient with HEP this visit due to inability to complete exercise without severe pain. Patient was provided extensive education on possible etiologies of her symptoms and treatments that could be utilized in therapy, and gradual progression for return to activity.   OBJECTIVE IMPAIRMENTS: Abnormal gait, decreased activity tolerance, decreased balance, decreased ROM, decreased strength, impaired flexibility, postural dysfunction, and  pain.   ACTIVITY LIMITATIONS: carrying, lifting, bending, sitting, standing, squatting, sleeping, stairs, transfers, bed mobility, locomotion level, and caring for others  PARTICIPATION LIMITATIONS: meal prep, cleaning, laundry, driving, shopping, and community activity  PERSONAL FACTORS: Fitness, Past/current experiences, Time since onset of injury/illness/exacerbation, and 3+ comorbidities: see PMH above are also affecting patient's functional outcome.    GOALS: Goals reviewed with patient? Yes  SHORT TERM GOALS: Target date: 09/26/2023  Patient will be I with initial HEP in order to progress with therapy. Baseline: None provided at eval 07/28/2023: patient reports independence with HEP Goal status: MET  2.  Patient will report low back and right leg pain </= 7/10 at worst to reduce functional limitations Baseline: 10/10 pain at worst 07/29/2023: reports pain at worst still 10/10 08/06/2023: continues to report pain 10/10 at worst 08/29/2023: continues to report pain 10/10 at worst 09/16/2023: continues to report pain 10/10 at worst 10/20/2023: continues to report pain 10/10 at worst Goal status: ONGOING  3.  Patient will be able to perform sit to stand without difficulty or using UE for support to improve mobility and indicate improvement in LE strength Baseline: requires UE for support with standing from chair 07/29/2023: able to perform sit to stand without UE support but exhibits difficulty  08/06/2023: able to perform sit to stand without UE support but exhibits difficulty due to knee pain 08/29/2023: patient can perform sit to stand without difficulty Goal status: MET  LONG TERM GOALS: Target date: 12/01/2023  Patient will be I with final HEP to maintain progress from PT. Baseline: None provided at eval 08/06/2023: progressing 08/29/2023: progressing 10/20/2023: progressing Goal status: ONGOING  2.  Patient will report Modified Oswestry </= 25/50 (50% disability) in order to  indicate an improvement in their functional status Baseline: 37/50 (74% disability) 08/06/2023: not assessed 08/29/2023: 33/50 (66%) 10/06/2023: 31/50 (62%) 10/20/2023: 30/50 (60%) Goal status: ONGOING  3.  Patient will demonstrate improvement in core and hip strength grossly >/= 4-/5 MMT in order to improve her activity tolerance with household tasks Baseline: see limitations above 08/06/2023: see limitations above 08/29/2023: see limitations above 10/20/2023: see limitations above Goal status: ONGOING  4.  Patient will demonstrate right knee strength >/= 4+/5 MMT in order to improve stair negotiation and reduce fall risk due to right knee giving out Baseline: see limitations above 08/06/2023: see limitations above 08/29/2023: see limitations above 10/20/2023: see limitations above Goal status: ONGOING  5. Patient will report lower back and right leg pain </= 4/10 in order to reduce functional limitations  Baseline: 10/10 at worst  08/06/2023: continues to report pain 10/10 at worst 08/29/2023: continues to report pain 10/10 at worst 09/16/2023: continues to report pain 10/10 at worst 10/20/2023: continues to report pain 10/10 at worst  Goal status: ONGOING   PLAN: PT FREQUENCY: 1-2x/week  PT DURATION: 6 weeks  PLANNED INTERVENTIONS: 97164- PT Re-evaluation, 97110-Therapeutic exercises, 97530- Therapeutic activity, 97112- Neuromuscular re-education, 97535- Self Care, [id number]-  Manual therapy, [id number]- Electrical stimulation (unattended), [phone]- Electrical stimulation (manual), Patient/Family education, Balance training, Taping, Dry Needling, Joint mobilization, Joint manipulation, Spinal manipulation, Spinal mobilization, Cryotherapy, and Moist heat.  PLAN FOR NEXT SESSION: Review HEP and progress PRN, continue with modalities for pain relief as needed, manual/TPDN for right lumbar and gluteal region, LAD, gentle stretching and mobility for lumbar, gentle core activation, hip and LE strengthening as  tolerated    Elaine Daring, PT, DPT, LAT, ATC 10/30/23  11:46 AM Phone: [phone] Fax: [phone]   PHYSICAL THERAPY DISCHARGE SUMMARY  Visits from Start of Care: 29  Current functional level related to goals / functional outcomes: See above   Remaining deficits: See above   Education / Equipment: HEP   Patient agrees to discharge. Patient goals were not met. Patient is being discharged due to not returning since the last visit.  Elaine Daring, PT, DPT, LAT, ATC 04/14/2024  10:38 AM Phone: [phone] Fax: [phone]   "

## 2023-11-02 ENCOUNTER — Other Ambulatory Visit (HOSPITAL_BASED_OUTPATIENT_CLINIC_OR_DEPARTMENT_OTHER): Payer: Self-pay | Admitting: Family

## 2023-11-02 DIAGNOSIS — I25118 Atherosclerotic heart disease of native coronary artery with other forms of angina pectoris: Secondary | ICD-10-CM

## 2023-11-02 DIAGNOSIS — I2089 Other forms of angina pectoris: Secondary | ICD-10-CM

## 2023-11-02 DIAGNOSIS — E785 Hyperlipidemia, unspecified: Secondary | ICD-10-CM

## 2023-11-03 ENCOUNTER — Other Ambulatory Visit (HOSPITAL_BASED_OUTPATIENT_CLINIC_OR_DEPARTMENT_OTHER): Payer: Self-pay | Admitting: Family

## 2023-11-10 NOTE — Progress Notes (Signed)
 [name] [address] [place] [address] Phone: [phone]   Assessment and Plan:    1. Acute left ankle pain 2. Left foot pain -Chronic with exacerbation, initial visit - Acute on chronic left foot and ankle pain after fall 1 week ago.  Consistent with contusion and lateral ankle sprain - X-ray obtained in clinic.  My interpretation: No acute fracture or dislocation.  Os navicular, os peroneum - Start meloxicam  15 mg daily x2 weeks.  If still having pain after 2 weeks, complete 3rd-week of NSAID. May use remaining NSAID as needed once daily for pain control.  Do not to use additional over-the-counter NSAIDs (ibuprofen , naproxen , Advil , Aleve , etc.) while taking prescription NSAIDs.  May use Tylenol  [phone] mg 2 to 3 times a day for breakthrough pain. - Recommend continuing HEP  for bilateral foot and ankle.  Due to significance of current flare affecting bilateral feet and ankles, recommend 2 weeks rest from physical therapy and could consider restarting physical therapy after follow-up visit - Recommend using lace up ankle brace as needed for support    Pertinent previous records reviewed include none  Follow Up: 2 weeks for reevaluation.  If improving, could consider returning to physical therapy.  If no improvement, could consider CT (patient does not believe she can have MRIs)   Subjective:   I, Erica [name], am serving as a Neurosurgeon for Doctor Morene Mace  Chief Complaint: ankle pain  HPI:  11/11/2023 Patient is a 48 year old female with ankle pain. Patient states bialt ankle pain . She slipped Monday and hit her ankle. Antalgic gait. Pain radiates up the leg TTP. Left ankle is affected side. No meds. Decreased ROM    Relevant Historical Information: Hypertension, CHF, CAD, OSA, GERD, history of breast cancer, elevated BMI, prediabetes  Additional pertinent review of systems negative.   Current Outpatient  Medications:    meloxicam  (MOBIC ) 15 MG tablet, Take 1 tablet (15 mg total) by mouth daily., Disp: 30 tablet, Rfl: 0   acetaZOLAMIDE  (DIAMOX ) 250 MG tablet, Take 4 tablets (1,000 mg total) by mouth 3 (three) times daily., Disp: 1080 tablet, Rfl: 3   albuterol  (PROVENTIL ) (2.5 MG/3ML) 0.083% nebulizer solution, USE 1 VIAL VIA NEBULIZER EVERY 4 HOURS AND AS NEEDED FOR WHEEZING OR SHORTNESS OF BREATH, Disp: 75 mL, Rfl: 1   albuterol  (VENTOLIN  HFA) 108 (90 Base) MCG/ACT inhaler, TAKE 2 PUFFS BY MOUTH EVERY 6 HOURS AS NEEDED FOR WHEEZE OR SHORTNESS OF BREATH, Disp: 8.5 each, Rfl: 3   AMBULATORY NON FORMULARY MEDICATION, Carpal Tunnel Wrist brace. Rt  Carpal Tunnel Syndrome G56.01  Disp 1, Disp: 1 each, Rfl: 0   AMBULATORY NON FORMULARY MEDICATION, Lace up ankle brace (ASO type)  Ankle sprain S93.491A  Disp 1, Disp: 1 each, Rfl: 0   amitriptyline  (ELAVIL ) 100 MG tablet, Take 2 tablets (200 mg total) by mouth at bedtime., Disp: 60 tablet, Rfl: 3   amLODipine  (NORVASC ) 10 MG tablet, TAKE 1 TABLET BY MOUTH EVERY DAY, Disp: 90 tablet, Rfl: 3   benzonatate  (TESSALON ) 100 MG capsule, Take 1 capsule (100 mg total) by mouth 3 (three) times daily as needed., Disp: 21 capsule, Rfl: 0   budesonide -formoterol  (SYMBICORT ) 160-4.5 MCG/ACT inhaler, INHALE 2 PUFFS INTO LUNGS 2 TIMES DAILY AS DIRECTED, Disp: 30.6 each, Rfl: 1   cetirizine  (ZYRTEC  ALLERGY ) 10 MG tablet, Take 1 tablet (10 mg total) by mouth daily as needed for allergies., Disp: 30 tablet, Rfl: 0  chlorthalidone  (HYGROTON ) 25 MG tablet, TAKE 1 TABLET (25 MG TOTAL) BY MOUTH DAILY., Disp: 90 tablet, Rfl: 1   Cholecalciferol  (VITAMIN D ) 50 MCG (2000 UT) tablet, Take 2,000 Units by mouth daily., Disp: , Rfl:    cloNIDine  (CATAPRES  - DOSED IN MG/24 HR) 0.3 mg/24hr patch, PLACE 2 PATCHES ONTO SKIN ONCE WEEKLY, Disp: 24 patch, Rfl: 2   clopidogrel  (PLAVIX ) 75 MG tablet, TAKE 1 TABLET BY MOUTH EVERY DAY, Disp: 90 tablet, Rfl: 3   clotrimazole  (MYCELEX ) 10 MG troche,  TAKE 1 TABLET BY MOUTH 5 TIMES DAILY., Disp: 70 Troche, Rfl: 5   dexlansoprazole  (DEXILANT ) 60 MG capsule, Take 1 capsule (60 mg total) by mouth daily., Disp: 90 capsule, Rfl: 2   dextromethorphan-guaiFENesin  (MUCINEX  DM) 30-600 MG 12hr tablet, Take 2 tablets by mouth 2 (two) times daily., Disp: , Rfl:    diltiazem  (CARDIZEM  CD) 360 MG 24 hr capsule, Take 1 capsule (360 mg total) by mouth daily., Disp: 90 capsule, Rfl: 1   doxazosin  (CARDURA ) 8 MG tablet, TAKE 2 TABLETS BY MOUTH AT BEDTIME, Disp: 180 tablet, Rfl: 3   DULoxetine  (CYMBALTA ) 30 MG capsule, TAKE 1 CAPSULE (30 MG TOTAL) BY MOUTH DAILY. WITH 60MG  FOR TOTAL OF 90 MG (Patient taking differently: Take 30 mg by mouth See admin instructions. With 60mg  for total of 90 mg), Disp: 30 capsule, Rfl: 3   DULoxetine  (CYMBALTA ) 60 MG capsule, TAKE 1 CAPSULE (60 MG TOTAL) BY MOUTH DAILY. FOR TOTAL OF 90 MG (Patient taking differently: Take 60 mg by mouth See admin instructions. Take with 30 mg for a total of 90 mg in the morning), Disp: 30 capsule, Rfl: 3   ENTRESTO  97-103 MG, TAKE 1 TABLET BY MOUTH TWICE A DAY, Disp: 60 tablet, Rfl: 11   EPINEPHRINE  0.3 mg/0.3 mL IJ SOAJ injection, INJECT 0.3 MLS INTO THE MUSCLE ONCE AS NEEDED FOR ANAPHYLAXIS, Disp: 2 Device, Rfl: 2   Evolocumab  (REPATHA  SURECLICK) 140 MG/ML SOAJ, INJECT 140 MG INTO THE SKIN EVERY 14 (FOURTEEN) DAYS., Disp: 6 mL, Rfl: 3   ezetimibe  (ZETIA ) 10 MG tablet, Take 10 mg by mouth daily., Disp: , Rfl:    famotidine  (PEPCID ) 20 MG tablet, Take 1-2 tablets (20-40 mg total) by mouth at bedtime. (Patient taking differently: Take 20 mg by mouth 4 (four) times daily -  before meals and at bedtime.), Disp: 180 tablet, Rfl: 2   gabapentin  (NEURONTIN ) 300 MG capsule, TAKE 1 CAPSULE BY MOUTH 3 TIMES DAILY AS NEEDED., Disp: 90 capsule, Rfl: 2   hydrALAZINE  (APRESOLINE ) 100 MG tablet, Take 1 tablet (100 mg total) by mouth 3 (three) times daily., Disp: 270 tablet, Rfl: 3   lidocaine -prilocaine  (EMLA ) cream,  Apply 1 application topically every 28 (twenty-eight) days. Apply 1 application to skin ever 28 days, Disp: , Rfl:    metoCLOPramide  (REGLAN ) 10 MG tablet, TAKE 1 TABLET BY MOUTH 4 TIMES DAILY., Disp: 360 tablet, Rfl: 0   metolazone  (ZAROXOLYN ) 2.5 MG tablet, TAKE 1 TABLET BY MOUTH ONE TIME PER WEEK, Disp: 12 tablet, Rfl: 2   minoxidil  (LONITEN ) 2.5 MG tablet, TAKE 2 TABLETS (5 MG TOTAL) BY MOUTH 2 (TWO) TIMES DAILY, Disp: 360 tablet, Rfl: 0   montelukast  (SINGULAIR ) 10 MG tablet, TAKE 1 TABLET BY MOUTH EVERYDAY AT BEDTIME, Disp: 90 tablet, Rfl: 3   Nebivolol  HCl 20 MG TABS, TAKE 2 TABLETS BY MOUTH EVERY DAY, Disp: 180 tablet, Rfl: 3   nitroGLYCERIN  (NITROSTAT ) 0.4 MG SL tablet, PLACE 1 TABLET UNDER THE TONGUE EVERY  5 MINUTES AS NEEDED FOR CHEST PAIN., Disp: 25 tablet, Rfl: 1   oxymetazoline  (MUCINEX  SINUS-MAX FULL FORCE) 0.05 % nasal spray, Place 1 spray into both nostrils 2 (two) times daily., Disp: , Rfl:    Potassium Chloride  ER 20 MEQ TBCR, Take 2 tablets (40 mEq total) by mouth 2 (two) times daily., Disp: 360 tablet, Rfl: 3   primidone  (MYSOLINE ) 50 MG tablet, Take 0.5 tablets (25 mg total) by mouth at bedtime., Disp: 45 tablet, Rfl: 3   promethazine  (PHENERGAN ) 25 MG tablet, TAKE 1 TABLET (25 MG TOTAL) BY MOUTH 3 (THREE) TIMES DAILY AS NEEDED FOR NAUSEA OR VOMITING., Disp: 270 tablet, Rfl: 1   promethazine -dextromethorphan (PROMETHAZINE -DM) 6.25-15 MG/5ML syrup, Take 5 mLs by mouth 4 (four) times daily as needed for cough., Disp: 473 mL, Rfl: 2   ranolazine  (RANEXA ) 1000 MG SR tablet, Take 1 tablet (1,000 mg total) by mouth 2 (two) times daily., Disp: 180 tablet, Rfl: 3   Respiratory Therapy Supplies (FLUTTER) DEVI, Use as directed, Disp: 1 each, Rfl: 0   rosuvastatin  (CRESTOR ) 40 MG tablet, TAKE 1 TABLET BY MOUTH EVERY DAY, Disp: 90 tablet, Rfl: 3   Spacer/Aero-Holding Chambers (AEROCHAMBER MV) inhaler, Use as instructed, Disp: 1 each, Rfl: 0   spironolactone  (ALDACTONE ) 100 MG tablet, TAKE 1  TABLET BY MOUTH EVERY DAY, Disp: 90 tablet, Rfl: 3   torsemide  (DEMADEX ) 20 MG tablet, TAKE 2 TABLETS BY MOUTH TWICE A DAY, Disp: 360 tablet, Rfl: 0 No current facility-administered medications for this visit.  Facility-Administered Medications Ordered in Other Visits:    goserelin (ZOLADEX ) injection 3.6 mg, 3.6 mg, Subcutaneous, Q28 days, Magrinat, Sandria BROCKS, MD, 3.6 mg at 06/01/20 1009   Objective:     Vitals:   11/11/23 1057  Pulse: 86  SpO2: 97%  Weight: 220 lb (99.8 kg)  Height: 5' 6 (1.676 m)      Body mass index is 35.51 kg/m.    Physical Exam:    Gen: Appears well, nad, nontoxic and pleasant Psych: Alert and oriented, appropriate mood and affect Neuro: sensation intact, strength is 5/5 with df/pf/inv/ev, muscle tone wnl Skin: no susupicious lesions or rashes  Left foot/ankle:  No deformity, Mild generalized ankle swelling and effusion.  Ecchymosis along distal lateral leg TTP distal fibula, deltoid, ATFL, navicular, lateral malleolus NTTP over fibular head,  medial mal, achilles,  , base of 5th,  , CFL,  , calcaneous or midfoot ROM DF 30, PF 45, inv/ev intact Negative ant drawer, talar tilt, Pain with rotation test, squeeze test. Neg thompson Mild pain with resisted inversion or eversion    Electronically signed by:  Odis Mace D.CLEMENTEEN [name] 11:28 AM 11/11/23

## 2023-11-11 ENCOUNTER — Ambulatory Visit: Admitting: Sports Medicine

## 2023-11-11 ENCOUNTER — Ambulatory Visit

## 2023-11-11 VITALS — HR 86 | Ht 66.0 in | Wt 220.0 lb

## 2023-11-11 DIAGNOSIS — M25572 Pain in left ankle and joints of left foot: Secondary | ICD-10-CM

## 2023-11-11 DIAGNOSIS — M79672 Pain in left foot: Secondary | ICD-10-CM

## 2023-11-11 MED ORDER — MELOXICAM 15 MG PO TABS: 15.0000 mg | ORAL_TABLET | Freq: Every day | ORAL | 0 refills | Status: AC

## 2023-11-11 NOTE — Patient Instructions (Signed)
-   Start meloxicam  15 mg daily x2 weeks.  If still having pain after 2 weeks, complete 3rd-week of NSAID. May use remaining NSAID as needed once daily for pain control.  Do not to use additional over-the-counter NSAIDs (ibuprofen , naproxen , Advil , Aleve , etc.) while taking prescription NSAIDs.  May use Tylenol  [phone] mg 2 to 3 times a day for breakthrough pain.  Lace up ankle brace if needed for stability when walking  Recommend delaying PT for 2 weeks or re-evaluated  But continue HEP   Keep 2 week follow up

## 2023-11-18 ENCOUNTER — Inpatient Hospital Stay: Attending: Adult Health

## 2023-11-18 VITALS — BP 185/87 | HR 84

## 2023-11-18 DIAGNOSIS — Z5111 Encounter for antineoplastic chemotherapy: Secondary | ICD-10-CM | POA: Diagnosis present

## 2023-11-18 DIAGNOSIS — Z1732 Human epidermal growth factor receptor 2 negative status: Secondary | ICD-10-CM | POA: Diagnosis not present

## 2023-11-18 DIAGNOSIS — Z171 Estrogen receptor negative status [ER-]: Secondary | ICD-10-CM | POA: Insufficient documentation

## 2023-11-18 DIAGNOSIS — Z1722 Progesterone receptor negative status: Secondary | ICD-10-CM | POA: Insufficient documentation

## 2023-11-18 DIAGNOSIS — C50412 Malignant neoplasm of upper-outer quadrant of left female breast: Secondary | ICD-10-CM | POA: Insufficient documentation

## 2023-11-18 MED ORDER — GOSERELIN ACETATE 3.6 MG ~~LOC~~ IMPL
3.6000 mg | DRUG_IMPLANT | SUBCUTANEOUS | Status: DC
  Administered 2023-11-18: 3.6 mg via SUBCUTANEOUS
  Filled 2023-11-18: qty 3.6

## 2023-11-24 ENCOUNTER — Ambulatory Visit: Payer: Self-pay | Admitting: Sports Medicine

## 2023-11-24 NOTE — Progress Notes (Unsigned)
   LILLETTE Ileana Collet, PhD, LAT, ATC acting as a scribe for [name], MD.  Erica [name] is a 48 y.o. female who presents to [facility] today for six week follow up for left ankle and foot. Pt was last seen by Dr. Leonce on 11/11/23 and was prescribed meloxicam  and advised to use an ankle brace.   Today, pt reports R foot is still painful. Pt locates pain to the plantar aspect of her R heel. She has been wearing a short CAM walker boot.    Pertinent review of systems: No fevers or chills  Relevant historical information: Hypertension.  Heart disease.  Breast cancer history   Exam:  BP (!) 196/106   Pulse 94   Ht 5' 6 (1.676 m)   Wt 220 lb (99.8 kg)   LMP  (LMP Unknown)   SpO2 98%   BMI 35.51 kg/m  General: Well Developed, well nourished, and in no acute distress.   MSK: Left foot and ankle normal appearing Tender palpation plantar calcaneus.  Normal foot and ankle motion.    Lab and Radiology Results  Procedure: Real-time Ultrasound Guided Injection of left plantar fascia Device: Philips Affiniti 50G/GE Logiq Images permanently stored and available for review in PACS Verbal informed consent obtained.  Discussed risks and benefits of procedure. Warned about infection, bleeding, hyperglycemia damage to structures among others. Patient expresses understanding and agreement Time-out conducted.   Noted no overlying erythema, induration, or other signs of local infection.   Skin prepped in a sterile fashion.   Local anesthesia: Topical Ethyl chloride.   With sterile technique and under real time ultrasound guidance: 40 mg of Kenalog  and 1 mL of Marcaine  injected into left plantar fascia. Fluid seen entering the plantar fascia.   Completed without difficulty   Pain immediately resolved suggesting accurate placement of the medication.   Advised to call if fevers/chills, erythema, induration, drainage, or persistent bleeding.   Images  permanently stored and available for review in the ultrasound unit.  Impression: Technically successful ultrasound guided injection.        Assessment and Plan: 48 y.o. female with left foot and ankle pain.  Etiology multifactorial.  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  Some pain due to plantar fasciitis.  Plan for plantar fascia injection and continued CAM Walker boot as needed.  Recheck in about a month.  Okay to wean out of boot and restart physical therapy when feeling better.   PDMP not reviewed this encounter. Orders Placed This Encounter  Procedures   US  LIMITED JOINT SPACE STRUCTURES LOW RIGHT(NO LINKED CHARGES)    Reason for Exam (SYMPTOM  OR DIAGNOSIS REQUIRED):   right foot pain    Preferred imaging location?:   [facility]   No orders of the defined types were placed in this encounter.    Discussed warning signs or symptoms. Please see discharge instructions. Patient expresses understanding.   The above documentation has been reviewed and is accurate and complete [name], M.D.

## 2023-11-25 ENCOUNTER — Other Ambulatory Visit: Payer: Self-pay

## 2023-11-25 ENCOUNTER — Ambulatory Visit: Admitting: Sports Medicine

## 2023-11-25 ENCOUNTER — Ambulatory Visit: Admitting: Family Medicine

## 2023-11-25 VITALS — BP 196/106 | HR 94 | Ht 66.0 in | Wt 220.0 lb

## 2023-11-25 DIAGNOSIS — M79672 Pain in left foot: Secondary | ICD-10-CM | POA: Diagnosis not present

## 2023-11-25 DIAGNOSIS — M79671 Pain in right foot: Secondary | ICD-10-CM

## 2023-11-25 NOTE — Patient Instructions (Addendum)
 Thank you for coming in today.   You received an injection today. Seek immediate medical attention if the joint becomes red, extremely painful, or is oozing fluid.   Recheck in 1 month

## 2023-11-29 ENCOUNTER — Other Ambulatory Visit: Payer: Self-pay | Admitting: Family Medicine

## 2023-11-29 DIAGNOSIS — M5416 Radiculopathy, lumbar region: Secondary | ICD-10-CM

## 2023-11-30 ENCOUNTER — Other Ambulatory Visit (HOSPITAL_BASED_OUTPATIENT_CLINIC_OR_DEPARTMENT_OTHER): Payer: Self-pay | Admitting: Cardiovascular Disease

## 2023-12-02 NOTE — Telephone Encounter (Signed)
 Last OV 11/25/23 Next OV 12/30/23  Last refill 08/27/23 Qty #90/2

## 2023-12-12 ENCOUNTER — Other Ambulatory Visit: Payer: Self-pay | Admitting: Sports Medicine

## 2023-12-16 ENCOUNTER — Inpatient Hospital Stay: Attending: Adult Health

## 2023-12-16 VITALS — BP 207/117 | HR 94

## 2023-12-16 DIAGNOSIS — C50412 Malignant neoplasm of upper-outer quadrant of left female breast: Secondary | ICD-10-CM | POA: Insufficient documentation

## 2023-12-16 DIAGNOSIS — Z5111 Encounter for antineoplastic chemotherapy: Secondary | ICD-10-CM | POA: Diagnosis present

## 2023-12-16 DIAGNOSIS — Z171 Estrogen receptor negative status [ER-]: Secondary | ICD-10-CM | POA: Diagnosis not present

## 2023-12-16 MED ORDER — GOSERELIN ACETATE 3.6 MG ~~LOC~~ IMPL
3.6000 mg | DRUG_IMPLANT | SUBCUTANEOUS | Status: DC
  Administered 2023-12-16: 3.6 mg via SUBCUTANEOUS
  Filled 2023-12-16: qty 3.6

## 2023-12-16 NOTE — Patient Instructions (Signed)
 Goserelin Implant What is this medication? GOSERELIN (GOE se rel in) treats prostate cancer and breast cancer. It works by decreasing levels of the hormones testosterone and estrogen in the body. This prevents prostate and breast cancer cells from spreading or growing. It may also be used to treat endometriosis. This is a condition where the tissue that lines the uterus grows outside the uterus. It works by decreasing the amount of estrogen your body makes, which reduces heavy bleeding and pain. It can also be used to help thin the lining of the uterus before a surgery used to prevent or reduce heavy periods. This medicine may be used for other purposes; ask your health care provider or pharmacist if you have questions. COMMON BRAND NAME(S): Zoladex, Zoladex 76-Month What should I tell my care team before I take this medication? They need to know if you have any of these conditions: Bone problems Diabetes Heart disease History of irregular heartbeat or rhythm An unusual or allergic reaction to goserelin, other medications, foods, dyes, or preservatives Pregnant or trying to get pregnant Breastfeeding How should I use this medication? This medication is injected under the skin. It is given by your care team in a hospital or clinic setting. Talk to your care team about the use of this medication in children. Special care may be needed. Overdosage: If you think you have taken too much of this medicine contact a poison control center or emergency room at once. NOTE: This medicine is only for you. Do not share this medicine with others. What if I miss a dose? Keep appointments for follow-up doses. It is important not to miss your dose. Call your care team if you are unable to keep an appointment. What may interact with this medication? Do not take this medication with any of the following: Cisapride Dronedarone Pimozide Thioridazine This medication may also interact with the following: Other  medications that cause heart rhythm changes This list may not describe all possible interactions. Give your health care provider a list of all the medicines, herbs, non-prescription drugs, or dietary supplements you use. Also tell them if you smoke, drink alcohol, or use illegal drugs. Some items may interact with your medicine. What should I watch for while using this medication? Visit your care team for regular checks on your progress. Your symptoms may appear to get worse during the first weeks of this therapy. Tell your care team if your symptoms do not start to get better or if they get worse after this time. Using this medication for a long time may weaken your bones. If you smoke or frequently drink alcohol you may increase your risk of bone loss. A family history of osteoporosis, chronic use of medications for seizures (convulsions), or corticosteroids can also increase your risk of bone loss. The risk of bone fractures may be increased. Talk to your care team about your bone health. This medication may increase blood sugar. The risk may be higher in patients who already have diabetes. Ask your care team what you can do to lower your risk of diabetes while taking this medication. This medication should stop regular monthly menstruation in women. Tell your care team if you continue to menstruate. Talk to your care team if you wish to become pregnant or think you might be pregnant. This medication can cause serious birth defects if taken during pregnancy or for 12 weeks after stopping treatment. Talk to your care team about reliable forms of contraception. Do not breastfeed while taking this  medication. This medication may cause infertility. Talk to your care team if you are concerned about your fertility. What side effects may I notice from receiving this medication? Side effects that you should report to your care team as soon as possible: Allergic reactions--skin rash, itching, hives, swelling  of the face, lips, tongue, or throat Change in the amount of urine Heart attack--pain or tightness in the chest, shoulders, arms, or jaw, nausea, shortness of breath, cold or clammy skin, feeling faint or lightheaded Heart rhythm changes--fast or irregular heartbeat, dizziness, feeling faint or lightheaded, chest pain, trouble breathing High blood sugar (hyperglycemia)--increased thirst or amount of urine, unusual weakness or fatigue, blurry vision High calcium level--increased thirst or amount of urine, nausea, vomiting, confusion, unusual weakness or fatigue, bone pain Pain, redness, irritation, or bruising at the injection site Severe back pain, numbness or weakness of the hands, arms, legs, or feet, loss of coordination, loss of bowel or bladder control Stroke--sudden numbness or weakness of the face, arm, or leg, trouble speaking, confusion, trouble walking, loss of balance or coordination, dizziness, severe headache, change in vision Swelling and pain of the tumor site or lymph nodes Trouble passing urine Side effects that usually do not require medical attention (report to your care team if they continue or are bothersome): Change in sex drive or performance Headache Hot flashes Rapid or extreme change in emotion or mood Sweating Swelling of the ankles, hands, or feet Unusual vaginal discharge, itching, or odor This list may not describe all possible side effects. Call your doctor for medical advice about side effects. You may report side effects to FDA at 1-800-FDA-1088. Where should I keep my medication? This medication is given in a hospital or clinic. It will not be stored at home. NOTE: This sheet is a summary. It may not cover all possible information. If you have questions about this medicine, talk to your doctor, pharmacist, or health care provider.  2024 Elsevier/Gold Standard (2021-08-09 00:00:00)

## 2023-12-16 NOTE — Progress Notes (Signed)
 Per Morna, NP okay to treat with elevated BP.

## 2023-12-17 ENCOUNTER — Encounter (HOSPITAL_BASED_OUTPATIENT_CLINIC_OR_DEPARTMENT_OTHER): Payer: Self-pay | Admitting: Family Medicine

## 2023-12-17 ENCOUNTER — Ambulatory Visit (HOSPITAL_BASED_OUTPATIENT_CLINIC_OR_DEPARTMENT_OTHER): Admitting: Family Medicine

## 2023-12-17 VITALS — BP 201/106 | HR 84 | Ht 66.0 in | Wt 220.0 lb

## 2023-12-17 DIAGNOSIS — Z23 Encounter for immunization: Secondary | ICD-10-CM | POA: Diagnosis not present

## 2023-12-17 DIAGNOSIS — F331 Major depressive disorder, recurrent, moderate: Secondary | ICD-10-CM

## 2023-12-17 DIAGNOSIS — R7303 Prediabetes: Secondary | ICD-10-CM

## 2023-12-17 DIAGNOSIS — I10 Essential (primary) hypertension: Secondary | ICD-10-CM

## 2023-12-17 DIAGNOSIS — R5382 Chronic fatigue, unspecified: Secondary | ICD-10-CM | POA: Insufficient documentation

## 2023-12-17 DIAGNOSIS — L700 Acne vulgaris: Secondary | ICD-10-CM

## 2023-12-17 LAB — POCT GLYCOSYLATED HEMOGLOBIN (HGB A1C)
HbA1c POC (<> result, manual entry): 5.7 % (ref 4.0–5.6)
HbA1c, POC (prediabetic range): 5.7 % (ref 5.7–6.4)
Hemoglobin A1C: 5.7 % — AB (ref 4.0–5.6)

## 2023-12-17 MED ORDER — CLINDAMYCIN PHOS (TWICE-DAILY) 1 % EX GEL: Freq: Two times a day (BID) | CUTANEOUS | 0 refills | Status: DC

## 2023-12-17 NOTE — Progress Notes (Signed)
 Subjective:   Erica [name] [birth year] 12/17/2023  Chief Complaint  Patient presents with   Medical Management of Chronic Issues    67-month follow up; pt states she has had places that have been popping up on her face as well as abdomen.    HPI: Erica [name] presents today for re-assessment and management of chronic medical conditions.   SKIN:  Patient states she begins to have blisters on her abdomen and on her face. She is using Dial antibacterial soap. She has not chagned soaps, detergents, or new products.    IMPAIRED FASTING GLUCOSE LEVAEH VICE is here for medical management of impaired fasting glucose.  Patient's current IFG medication regimen is: Diet controlled previously  Adhering to a diabetic diet: Yes Denies polydipsia, polyphagia, polyuria.   Lab Results  Component Value Date   HGBA1C 5.7 (A) 12/17/2023   HGBA1C 5.7 12/17/2023   HGBA1C 5.7 12/17/2023    DEPRESSION: [name] presents for the medical management of depression. Patient's amitriptyline  was adjusted to 150mg  daily. Reports this has been working well. Denies significant changes in mood or stressors recently. She does report chronic fatigue ongoing and needing to sleep frequently. She does use her BIPAP at night and when napping. She reprots hx of vit d and b12 deficiencies.   Current medication regimen: Elavil  150mg , Cymbalta  90mg  Counseling: Recommended Well controlled: Yes, currently controlled per patient.  Denies SI/HI.      12/17/2023    9:02 AM 06/13/2023    8:12 AM 03/14/2023   10:24 AM 03/10/2023    9:23 AM 02/03/2023    9:38 AM  Depression screen PHQ 2/9  Decreased Interest 1 2 2 2 3   Down, Depressed, Hopeless 1 2 0 0 0  PHQ - 2 Score 2 4 2 2 3   Altered sleeping 3 3 3 3 3   Tired, decreased energy 3 3 3 3 3   Change in appetite 3 3 2 2 3   Feeling bad or failure about yourself  1 3 2 2 3   Trouble concentrating 0 3 0 0 0  Moving slowly or fidgety/restless 1 0 1 1 1    Suicidal thoughts 0 0 0 0 0  PHQ-9 Score 13 19 13 13 16   Difficult doing work/chores Not difficult at all Very difficult Somewhat difficult Somewhat difficult Somewhat difficult        The following portions of the patient's history were reviewed and updated as appropriate: past medical history, past surgical history, family history, social history, allergies, medications, and problem list.   Patient Active Problem List   Diagnosis Date Noted   Chronic fatigue 12/17/2023   Prediabetes 06/13/2023   Moderate episode of recurrent major depressive disorder (HCC) 02/04/2023   GAD (generalized anxiety disorder) 02/04/2023   Chronic respiratory failure with hypoxia (HCC) 12/31/2021   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   Coronary artery disease    Chronic stable angina (HCC) 08/22/2020   SVT (supraventricular tachycardia) (HCC) 08/22/2020   Palpitations 02/28/2020   Right ankle pain 04/14/2019   OSA (obstructive sleep apnea) 10/01/2018   Tibialis posterior tendinitis, left 07/15/2018   Chronic pain in left foot 06/29/2018   Vitamin D  deficiency disease 03/31/2018   Persistent proteinuria 03/31/2018   Hyperlipidemia LDL goal <100 03/30/2018   Chemotherapy-induced peripheral neuropathy (HCC) 12/09/2016   Asthma in adult, mild persistent, uncomplicated 01/19/2016   Hypertensive heart disease 09/26/2015   Depression with somatization 06/15/2015   Hypertriglyceridemia without hypercholesterolemia 06/15/2015   Hypokalemia  06/15/2015   Insomnia secondary to anxiety 04/05/2015   Idiopathic intracranial hypertension 10/21/2014   Upper airway cough syndrome 10/09/2014   Malignant hypertension 10/09/2014   Morbid obesity due to excess calories (HCC) 10/09/2014   Apnea, sleep 05/04/2014   Acute on chronic diastolic CHF (congestive heart failure), NYHA class 2 (HCC) 05/03/2014   DOE (dyspnea on exertion) 07/14/2013   Acute on chronic diastolic heart failure (HCC) 06/23/2013   Malignant neoplasm of  upper-outer quadrant of left breast in female, estrogen receptor negative (HCC) 01/07/2013   Gastroparesis 01/04/2013   Resistant hypertension 09/17/2011   Axillary hidradenitis suppurativa 08/01/2011   Preventative health care 04/13/2011   Hyperglycemia 04/13/2011   GERD (gastroesophageal reflux disease) 03/18/2011   Cough variant asthma 09/27/2010   Past Medical History:  Diagnosis Date   Allergy     Anxiety    Apnea, sleep 05/04/2014   AHI 31/hr now on CPAP at 12cm H2O   Arthritis    Asthma    Asthma without status asthmaticus 01/19/2016   Axillary hidradenitis suppurativa 08/01/2011   Blood transfusion without reported diagnosis    Breast cancer (HCC) 2010   a. locally advanced left breast carcinoma diagnosed in 2010 and treated with neoadjuvant chemotherapy with docetaxel and cyclophosphamide as well as paclitaxel. This was followed by radiation therapy which was completed in 2011.   Chemotherapy-induced peripheral neuropathy (HCC) 12/09/2016   CHF (congestive heart failure) (HCC)    Chronic diastolic CHF (congestive heart failure) (HCC) 06/23/2013   Chronic kidney disease    Chronic pain in left foot 06/29/2018   Chronic stable angina (HCC) 08/22/2020   Cough variant asthma 09/27/2010   Followed in Pulmonary clinic/ [facility]/ [facility]  Onset reported in childhood  - PFT's 01/31/2011 FEV1  1.76 (60% ) ratio 68 and no better p B2,  DLCO 70% -med review 09/25/2012 > [place]  drug assistance  paperwork given    - HFA 90% 11/20/2012 .  -Med calendar 12/07/12 , 04/06/2013 , 04/18/2014,, 08/30/2014 , 11/08/2014  - PFT's 01/12/2013  1.73 (65%)  Ratio 73 and no better p B2, DLCO 86% - trial off    Depression with somatization 06/15/2015   Dyspnea on exertion    a. 01/2013 Lexi MV: EF 54%, no ischemia/infarct;  b. 05/2013 Echo: EF 60-65%, no rwma, Gr 2 DD;  b.    Essential hypertension 10/09/2014   Estimated Creatinine Clearance: 82.4 mL/min (by C-G formula based on SCr of 0.98 mg/dL).   Fatty  liver    Gastroparesis    GERD (gastroesophageal reflux disease)    Hyperglycemia 04/13/2011   Hypertension    Hypertensive heart disease 09/26/2015   Hypertriglyceridemia without hypercholesterolemia 06/15/2015   Framingham risk score is 1%    Idiopathic intracranial hypertension 10/21/2014   Impaired glucose tolerance 04/13/2011   Insomnia secondary to anxiety 04/05/2015   Lymphadenitis, chronic    restricted LEFT extremity   Malignant neoplasm of upper-outer quadrant of left breast in female, estrogen receptor negative (HCC) 01/07/2013   Stage III, Invasive Ductal, s/p partial Left Mastectomy/Lumpectomy (baseball sized) with Lymph Node dissection, had Chemo and RadRx     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   OSA (obstructive sleep apnea) 05/04/2014   AHI 31/hr now on CPAP at 12cm H2O   Oxygen deficiency    Uses 02 in CPAP   Persistent proteinuria 03/31/2018   Personal history of chemotherapy 2010   Personal history of radiation therapy 2010   Resistant hypertension 09/17/2011  Sleep apnea    SVT (supraventricular tachycardia) (HCC) 08/22/2020   Tibialis posterior tendinitis, left 07/15/2018   Injected in March 10, 2019   Ulcer    Upper airway cough syndrome 10/09/2014   Onset around 2012 with assoc pseudowheeze MRI 05/18/15 > Paranasal sinuses are clear - 11/17/2015 rec increase h1 to 2 hs to help with noct cough  - Allergy  profile 11/17/2015 >  Eos 0.1 /  IgE  80 POS RAST  to grass, ragweed, cedar tree  - d/c acei 03/06/2018  - flare end of Jan 2020 in setting of ? Uri/ poorly controlled GERD - flare mid Oct 2020 with uri / assoc secondary gerd   Vitamin D  deficiency disease 03/31/2018   Past Surgical History:  Procedure Laterality Date   BRAIN SURGERY     Shunt   BREAST LUMPECTOMY Left 09/2008   CARPAL TUNNEL RELEASE Left 2011   LYMPH NODE DISSECTION Left 2010   breast; 2 wks after breast lumpectomy   RENAL DENERVATION N/A 11/20/2022   Procedure: RENAL  DENERVATION;  Surgeon: Darron Deatrice LABOR, MD;  Location: [facility] INVASIVE CV LAB;  Service: Cardiovascular;  Laterality: N/A;   RIGHT/LEFT HEART CATH AND CORONARY ANGIOGRAPHY N/A 05/04/2019   Procedure: RIGHT/LEFT HEART CATH AND CORONARY ANGIOGRAPHY;  Surgeon: Cherrie Toribio SAUNDERS, MD;  Location: [facility] INVASIVE CV [facility];  Service: Cardiovascular;  Laterality: N/A;  Renal injection preformed    RIGHT/LEFT HEART CATH AND CORONARY ANGIOGRAPHY N/A 10/25/2020   Procedure: RIGHT/LEFT HEART CATH AND CORONARY ANGIOGRAPHY;  Surgeon: Anner Alm ORN, MD;  Location: [facility] INVASIVE CV LAB;  Service: Cardiovascular;  Laterality: N/A;   TENDON RELEASE Right 2012   hand   TUBAL LIGATION  05/11/2011   Procedure: POST PARTUM TUBAL LIGATION;  Surgeon: Lynwood Solomons, MD;  Location: [facility];  Service: Gynecology;  Laterality: Bilateral;  Induced for HTN   Family History  Problem Relation Age of Onset   Other Mother 67       breast calcifications treated with surgery, breast cancer pill, and radiation   Diabetes Mother    Fibroids Sister 56       s/p TAH-BSO   Diabetes Maternal Grandmother    Heart disease Maternal Grandmother    Hypertension Maternal Grandmother    CAD Maternal Grandmother    Breast cancer Other        maternal great aunt (MGM's sister)   Breast cancer Other        paternal great aunt dx middle ages; s/p mastectomy   Cancer Neg Hx    Alcohol abuse Neg Hx    Early death Neg Hx    Hyperlipidemia Neg Hx    Kidney disease Neg Hx    Stroke Neg Hx    Colon cancer Neg Hx    Esophageal cancer Neg Hx    Stomach cancer Neg Hx    Rectal cancer Neg Hx    Colon polyps Neg Hx    Outpatient Medications Prior to Visit  Medication Sig Dispense Refill   acetaZOLAMIDE  (DIAMOX ) 250 MG tablet Take 4 tablets (1,000 mg total) by mouth 3 (three) times daily. 1080 tablet 3   albuterol  (PROVENTIL ) (2.5 MG/3ML) 0.083% nebulizer solution USE 1 VIAL VIA NEBULIZER EVERY 4 HOURS AND AS NEEDED FOR WHEEZING OR SHORTNESS OF BREATH  75 mL 1   albuterol  (VENTOLIN  HFA) 108 (90 Base) MCG/ACT inhaler TAKE 2 PUFFS BY MOUTH EVERY 6 HOURS AS NEEDED FOR WHEEZE OR SHORTNESS OF BREATH 8.5 each 3   AMBULATORY  NON FORMULARY MEDICATION Carpal Tunnel Wrist brace. Rt  Carpal Tunnel Syndrome G56.01  Disp 1 1 each 0   AMBULATORY NON FORMULARY MEDICATION Lace up ankle brace (ASO type)  Ankle sprain S93.491A  Disp 1 1 each 0   amitriptyline  (ELAVIL ) 150 MG tablet Take 150 mg by mouth at bedtime.     amLODipine  (NORVASC ) 10 MG tablet TAKE 1 TABLET BY MOUTH EVERY DAY 90 tablet 3   budesonide -formoterol  (SYMBICORT ) 160-4.5 MCG/ACT inhaler INHALE 2 PUFFS INTO LUNGS 2 TIMES DAILY AS DIRECTED 30.6 each 1   cetirizine  (ZYRTEC  ALLERGY ) 10 MG tablet Take 1 tablet (10 mg total) by mouth daily as needed for allergies. 30 tablet 0   chlorthalidone  (HYGROTON ) 25 MG tablet TAKE 1 TABLET (25 MG TOTAL) BY MOUTH DAILY. 90 tablet 1   Cholecalciferol  (VITAMIN D ) 50 MCG (2000 UT) tablet Take 2,000 Units by mouth daily.     cloNIDine  (CATAPRES  - DOSED IN MG/24 HR) 0.3 mg/24hr patch PLACE 2 PATCHES ONTO SKIN ONCE WEEKLY 24 patch 2   clopidogrel  (PLAVIX ) 75 MG tablet TAKE 1 TABLET BY MOUTH EVERY DAY 90 tablet 3   clotrimazole  (MYCELEX ) 10 MG troche TAKE 1 TABLET BY MOUTH 5 TIMES DAILY. 70 Troche 5   dexlansoprazole  (DEXILANT ) 60 MG capsule Take 1 capsule (60 mg total) by mouth daily. 90 capsule 2   dextromethorphan-guaiFENesin  (MUCINEX  DM) 30-600 MG 12hr tablet Take 2 tablets by mouth 2 (two) times daily.     diltiazem  (CARDIZEM  CD) 360 MG 24 hr capsule Take 1 capsule (360 mg total) by mouth daily. 90 capsule 1   doxazosin  (CARDURA ) 8 MG tablet TAKE 2 TABLETS BY MOUTH AT BEDTIME 180 tablet 3   DULoxetine  (CYMBALTA ) 30 MG capsule TAKE 1 CAPSULE (30 MG TOTAL) BY MOUTH DAILY. WITH 60MG  FOR TOTAL OF 90 MG (Patient taking differently: Take 30 mg by mouth See admin instructions. With 60mg  for total of 90 mg) 30 capsule 3   DULoxetine  (CYMBALTA ) 60 MG capsule TAKE 1  CAPSULE (60 MG TOTAL) BY MOUTH DAILY. FOR TOTAL OF 90 MG (Patient taking differently: Take 60 mg by mouth See admin instructions. Take with 30 mg for a total of 90 mg in the morning) 30 capsule 3   ENTRESTO  97-103 MG TAKE 1 TABLET BY MOUTH TWICE A DAY 60 tablet 11   EPINEPHRINE  0.3 mg/0.3 mL IJ SOAJ injection INJECT 0.3 MLS INTO THE MUSCLE ONCE AS NEEDED FOR ANAPHYLAXIS 2 Device 2   Evolocumab  (REPATHA  SURECLICK) 140 MG/ML SOAJ INJECT 140 MG INTO THE SKIN EVERY 14 (FOURTEEN) DAYS. 6 mL 3   ezetimibe  (ZETIA ) 10 MG tablet TAKE 1 TABLET BY MOUTH EVERY DAY 90 tablet 2   famotidine  (PEPCID ) 20 MG tablet Take 1-2 tablets (20-40 mg total) by mouth at bedtime. (Patient taking differently: Take 20 mg by mouth 4 (four) times daily -  before meals and at bedtime.) 180 tablet 2   gabapentin  (NEURONTIN ) 300 MG capsule TAKE 1 CAPSULE BY MOUTH 3 TIMES DAILY AS NEEDED. 90 capsule 2   hydrALAZINE  (APRESOLINE ) 100 MG tablet Take 1 tablet (100 mg total) by mouth 3 (three) times daily. 270 tablet 3   lidocaine -prilocaine  (EMLA ) cream Apply 1 application topically every 28 (twenty-eight) days. Apply 1 application to skin ever 28 days     meloxicam  (MOBIC ) 15 MG tablet Take 1 tablet (15 mg total) by mouth daily. 30 tablet 0   metoCLOPramide  (REGLAN ) 10 MG tablet TAKE 1 TABLET BY MOUTH 4 TIMES DAILY. 360 tablet  0   metolazone  (ZAROXOLYN ) 2.5 MG tablet TAKE 1 TABLET BY MOUTH ONE TIME PER WEEK 12 tablet 2   minoxidil  (LONITEN ) 2.5 MG tablet TAKE 2 TABLETS (5 MG TOTAL) BY MOUTH 2 (TWO) TIMES DAILY 360 tablet 0   montelukast  (SINGULAIR ) 10 MG tablet TAKE 1 TABLET BY MOUTH EVERYDAY AT BEDTIME 90 tablet 3   Nebivolol  HCl 20 MG TABS TAKE 2 TABLETS BY MOUTH EVERY DAY 180 tablet 3   nitroGLYCERIN  (NITROSTAT ) 0.4 MG SL tablet PLACE 1 TABLET UNDER THE TONGUE EVERY 5 MINUTES AS NEEDED FOR CHEST PAIN. 25 tablet 1   oxymetazoline  (MUCINEX  SINUS-MAX FULL FORCE) 0.05 % nasal spray Place 1 spray into both nostrils 2 (two) times daily.      Potassium Chloride  ER 20 MEQ TBCR Take 2 tablets (40 mEq total) by mouth 2 (two) times daily. 360 tablet 3   primidone  (MYSOLINE ) 50 MG tablet Take 0.5 tablets (25 mg total) by mouth at bedtime. 45 tablet 3   promethazine  (PHENERGAN ) 25 MG tablet TAKE 1 TABLET (25 MG TOTAL) BY MOUTH 3 (THREE) TIMES DAILY AS NEEDED FOR NAUSEA OR VOMITING. 270 tablet 1   promethazine -dextromethorphan (PROMETHAZINE -DM) 6.25-15 MG/5ML syrup Take 5 mLs by mouth 4 (four) times daily as needed for cough. 473 mL 2   ranolazine  (RANEXA ) 1000 MG SR tablet Take 1 tablet (1,000 mg total) by mouth 2 (two) times daily. 180 tablet 3   Respiratory Therapy Supplies (FLUTTER) DEVI Use as directed 1 each 0   rosuvastatin  (CRESTOR ) 40 MG tablet TAKE 1 TABLET BY MOUTH EVERY DAY 90 tablet 3   Spacer/Aero-Holding Chambers (AEROCHAMBER MV) inhaler Use as instructed 1 each 0   spironolactone  (ALDACTONE ) 100 MG tablet TAKE 1 TABLET BY MOUTH EVERY DAY 90 tablet 3   torsemide  (DEMADEX ) 20 MG tablet TAKE 2 TABLETS BY MOUTH TWICE A DAY 360 tablet 0   benzonatate  (TESSALON ) 100 MG capsule Take 1 capsule (100 mg total) by mouth 3 (three) times daily as needed. 21 capsule 0   amitriptyline  (ELAVIL ) 100 MG tablet Take 2 tablets (200 mg total) by mouth at bedtime. (Patient not taking: Reported on 12/17/2023) 60 tablet 3   Facility-Administered Medications Prior to Visit  Medication Dose Route Frequency Provider Last Rate Last Admin   goserelin (ZOLADEX ) injection 3.6 mg  3.6 mg Subcutaneous Q28 days Magrinat, Sandria BROCKS, MD   3.6 mg at 06/01/20 1009   Allergies  Allergen Reactions   Aspirin  Shortness Of Breath and Palpitations    Pt can take ibuprofen  without reaction   Bactrim [Sulfamethoxazole-Trimethoprim] Anaphylaxis    Facial, throat and tongue swelling with difficulty swallowing.   Doxycycline  Anaphylaxis   Kiwi Extract Itching and Swelling    Lip swelling   Metoprolol  Hives and Itching   Penicillins Shortness Of Breath and Palpitations     Has patient had a PCN reaction causing immediate rash, facial/tongue/throat swelling, SOB or lightheadedness with hypotension: Yes Has patient had a PCN reaction causing severe rash involving mucus membranes or skin necrosis: Yes Has patient had a PCN reaction that required hospitalization Yes Has patient had a PCN reaction occurring within the last 10 years: No If all of the above answers are NO, then may proceed with Cephalosporin use.   Strawberry Extract Hives     ROS: A complete ROS was performed with pertinent positives/negatives noted in the HPI. The remainder of the ROS are negative.    Objective:   Today's Vitals   12/17/23 0859  BP: (!) 201/106  Pulse: 84  SpO2: 97%  Weight: 220 lb (99.8 kg)  Height: 5' 6 (1.676 m)    Physical Exam   GENERAL: Well-appearing, in NAD. Well nourished.  SKIN: Pink, warm and dry. Small pustular single lesions present to bilateral face and left lower abdomen. No erythema, drainage or bleeding.  Head: Normocephalic. NECK: Trachea midline. Full ROM w/o pain or tenderness. RESPIRATORY: Chest wall symmetrical. Respirations even and non-labored. Breath sounds clear to auscultation bilaterally.  CARDIAC: S1, S2 present, regular rate and rhythm without murmur or gallops. Peripheral pulses 2+ bilaterally.  MSK: Muscle tone and strength appropriate for age. EXTREMITIES: Without clubbing, cyanosis. Mild +1 edema BLE.  NEUROLOGIC: No motor or sensory deficits. Steady, even gait. C2-C12 intact.  PSYCH/MENTAL STATUS: Alert, oriented x 3. Cooperative, appropriate mood and affect.   Health Maintenance Due  Topic Date Due   Hepatitis B Vaccines 19-59 Average Risk (1 of 3 - 19+ 3-dose series) Never done   Zoster Vaccines- Shingrix (1 of 2) Never done   COVID-19 Vaccine (2 - Moderna risk series) 03/29/2021   Mammogram  08/21/2023    Results for orders placed or performed in visit on 12/17/23  POCT glycosylated hemoglobin (Hb A1C)  Result Value  Ref Range   Hemoglobin A1C 5.7 (A) 4.0 - 5.6 %   HbA1c POC (<> result, manual entry) 5.7 4.0 - 5.6 %   HbA1c, POC (prediabetic range) 5.7 5.7 - 6.4 %   HbA1c, POC (controlled diabetic range)      The ASCVD Risk score (Arnett DK, et al., 2019) failed to calculate for the following reasons:   The valid systolic blood pressure range is 90 to 200 mmHg     Assessment & Plan:  1. Prediabetes (Primary) A1C improved from 6.0 down to 5.7 with dietary changes. Patient congratulated on this. Will continue with lifestyle management. Patient left exam room prior to BP recheck.  - POCT glycosylated hemoglobin (Hb A1C)  2. Moderate episode of recurrent major depressive disorder (HCC) Controlled per patient. Will continue current regimen. Safety plan reviewed with patient.   3. Chronic fatigue Will check Vit D, B12 with labs today for possible contribution to fatigue. I believe this is likely multifactoral due to chronic comorbidities.  - VITAMIN D  25 Hydroxy (Vit-D Deficiency, Fractures) - Vitamin B12  4. Encounter for immunization - Flu vaccine trivalent PF, 6mos and older(Flulaval,Afluria,Fluarix,Fluzone)  5. Acne vulgaris Start Clindagel to affected areas BID for 2 weeks. Continue good hygiene with regular non-irritating soaps. Will reach out to PCP if no improvement or worsening.   6. Malignant HTN Patient left prior to BP recheck. She is chronically uncontrolled but asymptomatic. Will continue monitoring with home BP cuff and follow with Cardiology.   Meds ordered this encounter  Medications   clindamycin  (CLINDAGEL) 1 % gel    Sig: Apply topically 2 (two) times daily.    Dispense:  30 g    Refill:  0    Supervising Provider:   DE PERU, RAYMOND J [[id number]]   Lab Orders         VITAMIN D  25 Hydroxy (Vit-D Deficiency, Fractures)         Vitamin B12         POCT glycosylated hemoglobin (Hb A1C)      Return in about 6 months (around 06/15/2024) for ANNUAL PHYSICAL, Prediabetes Follow  up .    Patient to reach out to office if new, worrisome, or unresolved symptoms arise or if no improvement in patient's  condition. Patient verbalized understanding and is agreeable to treatment plan. All questions answered to patient's satisfaction.    Thersia [name] Stark, [place]

## 2023-12-18 LAB — VITAMIN D 25 HYDROXY (VIT D DEFICIENCY, FRACTURES): Vit D, 25-Hydroxy: 6.8 ng/mL — ABNORMAL LOW (ref 30.0–100.0)

## 2023-12-18 LAB — VITAMIN B12: Vitamin B-12: 546 pg/mL (ref 232–1245)

## 2023-12-21 ENCOUNTER — Ambulatory Visit (HOSPITAL_BASED_OUTPATIENT_CLINIC_OR_DEPARTMENT_OTHER): Payer: Self-pay | Admitting: Family Medicine

## 2023-12-21 ENCOUNTER — Other Ambulatory Visit (HOSPITAL_BASED_OUTPATIENT_CLINIC_OR_DEPARTMENT_OTHER): Payer: Self-pay | Admitting: Family Medicine

## 2023-12-21 MED ORDER — CHOLECALCIFEROL 1.25 MG (50000 UT) PO CAPS: 50000.0000 [IU] | ORAL_CAPSULE | ORAL | 2 refills | Status: AC

## 2023-12-21 NOTE — Progress Notes (Signed)
 Hi Erica [name],  Your vitamin D  is very low and likely contributing to your fatigue. I have sent in Vitamin D3 50,000 unit capsules for you to take 1 capsule ONCE WEEKLY. You can stop your other vitamin D3 replacement at this time. Please continue a multivitamin with calcium  to help with absorption. We will plan to recheck in 6 months.

## 2023-12-30 ENCOUNTER — Other Ambulatory Visit: Payer: Self-pay

## 2023-12-30 ENCOUNTER — Ambulatory Visit (INDEPENDENT_AMBULATORY_CARE_PROVIDER_SITE_OTHER): Admitting: Family Medicine

## 2023-12-30 VITALS — BP 180/100 | HR 92 | Ht 66.0 in | Wt 220.0 lb

## 2023-12-30 DIAGNOSIS — M5442 Lumbago with sciatica, left side: Secondary | ICD-10-CM

## 2023-12-30 DIAGNOSIS — M5441 Lumbago with sciatica, right side: Secondary | ICD-10-CM | POA: Diagnosis not present

## 2023-12-30 DIAGNOSIS — G8929 Other chronic pain: Secondary | ICD-10-CM

## 2023-12-30 DIAGNOSIS — F5105 Insomnia due to other mental disorder: Secondary | ICD-10-CM

## 2023-12-30 DIAGNOSIS — M79672 Pain in left foot: Secondary | ICD-10-CM

## 2023-12-30 DIAGNOSIS — F419 Anxiety disorder, unspecified: Secondary | ICD-10-CM

## 2023-12-30 MED ORDER — AMITRIPTYLINE HCL 25 MG PO TABS: ORAL_TABLET | ORAL | 0 refills | Status: AC

## 2023-12-30 MED ORDER — ZOLPIDEM TARTRATE ER 6.25 MG PO TBCR
6.2500 mg | EXTENDED_RELEASE_TABLET | Freq: Every evening | ORAL | 0 refills | Status: DC | PRN
Start: 2023-12-30 — End: 2024-01-05

## 2023-12-30 NOTE — Progress Notes (Signed)
 I, Claretha Schimke am a scribe for Dr. [name], MD.  Erica [name] is a 48 y.o. female who presents to [facility] today for f/u L foot pain. Pt was last seen by Dr. Lloyd on 11/25/23 and was given a L plantar fascia steroid injection and was advised to cont CAM walker boot.   Today,  pt reports that the right foot is doing. Injection in the right heel feels better than it was before. Left side is doing good.    She has very bothersome chronic back pain extending from her low back into her SI joint area bilaterally.  Is a chronic ongoing issue that is worsening recently without any injury.  She denies significant pain radiating down her legs but does note some paresthesia into her bilateral lower extremities.  Additionally she notes significant difficulty sleeping.  She is having trouble still falling asleep and staying asleep.  Her primary care provider has prescribed amitriptyline  and titrated the dose up to 150 mg at bedtime.  She notes very bothersome dry mouth from this medication and notes that it does not help with sleep or pain.  She notes that she is struggling right now and had such a poor night sleep the other night without enough pain that she was unable to take her 50 year old to school on Monday and feels very guilty about that.  Dx imaging: 11/11/23 L foot & ankle XR  Pertinent review of systems: No fevers or chills  Relevant historical information: Hypertension.  History of cancer.   Exam:  BP (!) 180/100   Pulse 92   Ht 5' 6 (1.676 m)   Wt 220 lb (99.8 kg)   LMP  (LMP Unknown)   SpO2 97%   BMI 35.51 kg/m  General: Well Developed, well nourished, and in no acute distress.   MSK: L-spine nontender palpation spinal midline.  Tender palpation paraspinal musculature. Tender palpation SI joints bilaterally. Decreased lumbar motion.  Lower extremity strength is intact.  Psych: Tearful affect.  No SI or HI  expressed.       Assessment and Plan: 48 y.o. female with chronic low back pain.  X-ray from November 2024 does show some degenerative changes.  However pain is worse than usual.  Plan to evaluate this further.  She has failed conservative management even with physical therapy in the past.  Plan for CT myelogram lumbar spine.  Will also check pelvis to evaluate sacrum and SI joints..  She is not MRI compatible with a shunt history.  We spent time talking about insomnia.  Amitriptyline  has not been helpful and is obnoxious with that side effect.  Will titrate off of amitriptyline  and try extended release Ambien .  Recheck after we get the CT myelogram results back.   PDMP reviewed during this encounter. Orders Placed This Encounter  Procedures   US  LIMITED JOINT SPACE STRUCTURES LOW LEFT(NO LINKED CHARGES)    Reason for Exam (SYMPTOM  OR DIAGNOSIS REQUIRED):   foot pain    Preferred imaging location?:   [facility]   CT LUMBAR SPINE W CONTRAST    Standing Status:   Future    Expiration Date:   12/29/2024    If indicated for the ordered procedure, I authorize the administration of contrast media per Radiology protocol:   Yes    Does the patient have a contrast media/X-ray dye allergy ?:   No    Is patient pregnant?:   No  Preferred imaging location?:   [facility]   DG Myelogram Lumbar    Standing Status:   Future    Expiration Date:   12/29/2024    If indicated for the ordered procedure, I authorize the administration of contrast media per Radiology protocol:   Yes    Reason for Exam (SYMPTOM  OR DIAGNOSIS REQUIRED):   chronic low back pain    Is the patient pregnant?:   No    Preferred Imaging Location?:   [facility]   CT PELVIS LIMITED WO CONTRAST    Standing Status:   Future    Expiration Date:   12/29/2024    Is patient pregnant?:   No    Preferred imaging location?:   [facility]   Meds ordered this encounter   Medications   amitriptyline  (ELAVIL ) 25 MG tablet    Sig: Take 5 tablets (125 mg total) by mouth at bedtime for 5 days, THEN 4 tablets (100 mg total) at bedtime for 5 days, THEN 3 tablets (75 mg total) at bedtime for 5 days, THEN 2 tablets (50 mg total) at bedtime for 5 days, THEN 1 tablet (25 mg total) at bedtime for 5 days.    Dispense:  75 tablet    Refill:  0   zolpidem  (AMBIEN  CR) 6.25 MG CR tablet    Sig: Take 1 tablet (6.25 mg total) by mouth at bedtime as needed for sleep.    Dispense:  30 tablet    Refill:  0     Discussed warning signs or symptoms. Please see discharge instructions. Patient expresses understanding.   The above documentation has been reviewed and is accurate and complete [name], M.D.

## 2023-12-30 NOTE — Patient Instructions (Signed)
 Thank you for coming in today.   Plan for CT-Myelogram of your low back and pelvis. You should hear soon about scheduling.  Check back after we get the CT results.

## 2024-01-01 ENCOUNTER — Telehealth: Payer: Self-pay

## 2024-01-01 NOTE — Telephone Encounter (Signed)
 Prior Authorization initiated for ZOLPIDEM  ER via [email or web address] KEY: [id number]

## 2024-01-01 NOTE — Telephone Encounter (Signed)
 Prior Authorization required for Zolpidem  Tart 6.25 mg tab, 1 tab po at bedtime prn

## 2024-01-02 NOTE — Telephone Encounter (Signed)
 Prior Auth for Zolpidem  ER DENIED  Will cover SR but not ER

## 2024-01-05 MED ORDER — ZOLPIDEM TARTRATE 5 MG PO TABS: 5.0000 mg | ORAL_TABLET | Freq: Every evening | ORAL | 3 refills | Status: AC | PRN

## 2024-01-05 NOTE — Addendum Note (Signed)
 Addended by: JOANE ARTIST RAMAN on: 01/05/2024 07:08 AM   Modules accepted: Orders

## 2024-01-05 NOTE — Telephone Encounter (Signed)
 Regular Ambien  prescribed

## 2024-01-06 ENCOUNTER — Ambulatory Visit
Admission: RE | Admit: 2024-01-06 | Discharge: 2024-01-06 | Disposition: A | Discharge status: Home / Self Care | Source: Ambulatory Visit | Attending: Family Medicine | Admitting: Family Medicine

## 2024-01-06 DIAGNOSIS — G8929 Other chronic pain: Secondary | ICD-10-CM

## 2024-01-07 ENCOUNTER — Encounter: Admitting: Obstetrics and Gynecology

## 2024-01-09 ENCOUNTER — Ambulatory Visit: Payer: Self-pay | Admitting: Family Medicine

## 2024-01-09 NOTE — Progress Notes (Signed)
 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  The CT myelogram of the lumbar spine is scheduled to be done next week.  Will see what the results of that show.

## 2024-01-13 ENCOUNTER — Telehealth: Payer: Self-pay

## 2024-01-13 ENCOUNTER — Inpatient Hospital Stay

## 2024-01-13 ENCOUNTER — Telehealth: Payer: Self-pay | Admitting: Adult Health

## 2024-01-13 ENCOUNTER — Other Ambulatory Visit: Payer: Self-pay | Admitting: Sports Medicine

## 2024-01-13 NOTE — Telephone Encounter (Signed)
 called to reschedule canceled appt on 10/14. No answer, No vm.. will try back again

## 2024-01-13 NOTE — Discharge Instructions (Signed)
 Myelogram Discharge Instructions  Go home and rest quietly as needed. You may resume normal activities; however, do not exert yourself strongly or do any heavy lifting today and tomorrow.   DO NOT drive today.    You may resume your normal diet and medications unless otherwise indicated. Drink lots of extra fluids today and tomorrow.   The incidence of headache, nausea, or vomiting is about 5% (one in 20 patients).  If you develop a headache, lie flat for 24 hours and drink plenty of fluids until the headache goes away.  Caffeinated beverages may be helpful. If when you get up you still have a headache when standing, go back to bed and force fluids for another 24 hours.   If you develop severe nausea and vomiting or a headache that does not go away with the flat bedrest after 48 hours, please call [phone].   Call your physician for a follow-up appointment.  The results of your myelogram will be sent directly to your physician by the following day.  If you have any questions or if complications develop after you arrive home, please call [phone].  Discharge instructions have been explained to the patient.  The patient, or the person responsible for the patient, fully understands these instructions.   Thank you for visiting our office today.   You May Restart Your Plavix  post procedure

## 2024-01-13 NOTE — Progress Notes (Signed)
 See telephone note  Pt states she had imaging done recently at [facility]

## 2024-01-14 ENCOUNTER — Inpatient Hospital Stay
Admission: RE | Admit: 2024-01-14 | Discharge: 2024-01-14 | Disposition: A | Discharge status: Home / Self Care | Source: Ambulatory Visit | Attending: Family Medicine | Admitting: Family Medicine

## 2024-01-14 ENCOUNTER — Other Ambulatory Visit

## 2024-01-14 NOTE — Progress Notes (Deleted)
 Cardiology Office Note:    Date:  01/14/2024   ID:  Erica [name], DOB 11/02/1975, MRN [id number]  PCP:  Knute Thersia Bitters, FNP  Cardiologist:  Annabella Scarce, MD   Referring MD: Knute Thersia Bitters, *   CC: Hypertension  History of Present Illness:    Erica [name] is a 48 y.o. female with a hx of resistant hypertension s/p renal denervation, medically managed CAD, chronic diastolic heart failure, breast cancer s/p chemo and XRT, morbid obesity and OSA on BiPAP, here for follow-up. She initially established care in the hypertension clinic 04/10/2020. She reports that her BP has been elevated since she was treated for breast cancer in 2010.  Since then her blood pressure has never been very well have been controlled.  She has been on multiple antihypertensives and her blood pressure remains in the 160s to 170s systolic.  She struggles with exertional dyspnea that makes it hard for her to walk even short distances.  Echo 03/2018 revealed LVEF 45 to 50% with grade 2 diastolic dysfunction.  There was mild MR.  PASP was 33.  Erica [name] has a history of recurrent syncope.  She had an episode that occurred while wearing an ambulatory monitor and the episode was felt to be due to narcolepsy.  She has sleep apnea but is not tolerant of her CPAP machine.  She now uses her BIPAP machine but struggles with insomnia.  She wakes up hourly.  She underwent left and right heart catheterization on 05/04/2019.  She was found to have 60% distal left circumflex and 80% distal LAD disease.  Given her aspirin  allergy  Dr. Maranda recommended that she start clopidogrel .  Right atrial pressure was 10, and PA pressure was 26/10.  PCWP 18.  LVEDP was 23 mmHg.  At her first hypertension clinic visit there were concerns about whether she was taking her medication as prescribed.  Discrepancies between what was ordered and what she was actually picking up from the pharmacy.  She continues to report taking all medications  as prescribed.  Her blood pressures continue to run quite elevated.  At her initial assessment doxazosin  was added to her regimen.  This does not seem to have helped much.  She followed up with our pharmacist and was started on amlodipine .  She was unable to exercise due to poorly controlled blood pressure. Renal artery Dopplers revealed 1- 59% stenosis bilaterally in 2019.  CT-A of the abdomen showed a 1.3 cm renal cyst but no adrenal adenomas.  There was no evidence of renal artery stenosis.  Erica [name] saw our pharmacist on 12/2019.  She was in a lot of pain at the time.  Given her high number or medications and her pain, her medications were not changed.  She has struggled with LE edema.  LE Doppler was negative for DVT 10/2019.  She was given metolazone  to take prior to torsemide .  For the last two months she has been more swollen but it is finally better.  Her weight has decreased from 211 lb to 187 lb.  Erica [name] continues to struggle with shortness of breath. Her edema resolved but there was no improvement in her breathing.  She uses her CPAP every other night.  She notes that there is a recall on her CPAP and she isn't supposed to be using it.  However, she can't sleep well without it. Lately her BP at home has been in the 150-200s.  She notes that when she has pain it  is higher.  She has a lot of joint pain, especially in her feel.    Erica [name] wanted to reduce her medications.  However her blood pressure was significantly elevated.  We reduced hydralazine  to 50 mg 3 times a day and increase doxazosin  to 8 mg twice daily in an effort to try and get her off of her 3 times daily medications and improve compliance.  Ranexa  was increased due to chest pain.  This seems to have helped her chest pain.  It seems to occur randomly both at rest and with exertion.  Her clonidine  was increased to 0.6 mg every 24 hours. She wore an  ambulatory monitor that showed PVCs and up to 8 beats of SVT.  The episodes seem to  occur mostly in the early a.m.  She started going [facility] the PREP program at [facility]. She continued to have exertional intolerance and chest pain.  She underwent left heart catheterization 09/2020 and her cath revealed 80% distal LAD and 30% LPA B with 50% PL 1 stenoses.  Overall unchanged from prior.  Her blood pressure remained very poorly controlled, initially in the low 200s over 110s.  It did improve to 152/78 with hydralazine  and verapamil  IV.  It was noted that she was extremely anxious and that it was also contributing.  Her angina was thought to be due to microvascular disease and poorly controlled blood pressure.  Her distal LAD lesion was not thought to be favorable for PCI but if her chest pain were intractable could be considered.  Erica [name] was struggling with anxiety and referred to psychiatry. Doxazosin  was increased.  Diltiazem  was increased to 300 mg daily due to palpitations.  Renal artery Dopplers revealed minimal stenosis.  She underwent renal artery denervation 10/2022.  Initial blood pressure after was 140s to 150s. She was last seen in clinic 08/2022 and BP remained uncontrolled.  Discussed the use of AI scribe software for clinical note transcription with the patient, who gave verbal consent to proceed.  History of Present Illness     Past Medical History:  Diagnosis Date   Allergy     Anxiety    Apnea, sleep 05/04/2014   AHI 31/hr now on CPAP at 12cm H2O   Arthritis    Asthma    Asthma without status asthmaticus 01/19/2016   Axillary hidradenitis suppurativa 08/01/2011   Blood transfusion without reported diagnosis    Breast cancer (HCC) 2010   a. locally advanced left breast carcinoma diagnosed in 2010 and treated with neoadjuvant chemotherapy with docetaxel and cyclophosphamide as well as paclitaxel. This was followed by radiation therapy which was completed in 2011.   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  -Med calendar 12/07/12 , 04/06/2013 , 04/18/2014,, 08/30/2014 , 11/08/2014  - PFT's 01/12/2013  1.73 (65%)  Ratio 73 and no better p B2, DLCO 86% - trial off    Depression with somatization 06/15/2015   Dyspnea on exertion  a. 01/2013 Lexi MV: EF 54%, no ischemia/infarct;  b. 05/2013 Echo: EF 60-65%, no rwma, Gr 2 DD;  b.    Essential hypertension 10/09/2014   Estimated Creatinine Clearance: 82.4 mL/min (by C-G formula based on SCr of 0.98 mg/dL).   Fatty liver    Gastroparesis    GERD (gastroesophageal reflux disease)    Hyperglycemia 04/13/2011   Hypertension    Hypertensive heart disease 09/26/2015   Hypertriglyceridemia without hypercholesterolemia 06/15/2015   Framingham risk score is 1%    Idiopathic intracranial hypertension 10/21/2014   Impaired glucose tolerance 04/13/2011   Insomnia secondary to anxiety 04/05/2015   Lymphadenitis, chronic    restricted LEFT extremity   Malignant neoplasm of upper-outer quadrant of left breast in female, estrogen receptor negative (HCC) 01/07/2013   Stage III, Invasive Ductal, s/p partial Left Mastectomy/Lumpectomy (baseball sized) with Lymph Node dissection, had Chemo and RadRx     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/2010   OSA (obstructive sleep apnea) 05/04/2014   AHI 31/hr now on CPAP at 12cm H2O   Oxygen deficiency    Uses 02 in CPAP   Persistent proteinuria 03/31/2018   Personal history of chemotherapy 2010   Personal history of radiation therapy 2010   Resistant hypertension 09/17/2011       Sleep  apnea    SVT (supraventricular tachycardia) 08/22/2020   Tibialis posterior tendinitis, left 07/15/2018   Injected in March 10, 2019   Ulcer    Upper airway cough syndrome 10/09/2014   Onset around 2012 with assoc pseudowheeze MRI 05/18/15 > Paranasal sinuses are clear - 11/17/2015 rec increase h1 to 2 hs to help with noct cough  - Allergy  profile 11/17/2015 >  Eos 0.1 /  IgE  80 POS RAST  to grass, ragweed, cedar tree  - d/c acei 03/06/2018  - flare end of Jan 2020 in setting of ? Uri/ poorly controlled GERD - flare mid Oct 2020 with uri / assoc secondary gerd   Vitamin D  deficiency disease 03/31/2018    Past Surgical History:  Procedure Laterality Date   BRAIN SURGERY     [facility]   BREAST LUMPECTOMY Left 09/2008   CARPAL TUNNEL RELEASE Left 2011   LYMPH NODE DISSECTION Left 2010   breast; 2 wks after breast lumpectomy   RENAL DENERVATION N/A 11/20/2022   Procedure: RENAL DENERVATION;  Surgeon: Darron Deatrice LABOR, MD;  Location: [facility] INVASIVE CV LAB;  Service: Cardiovascular;  Laterality: N/A;   RIGHT/LEFT HEART CATH AND CORONARY ANGIOGRAPHY N/A 05/04/2019   Procedure: RIGHT/LEFT HEART CATH AND CORONARY ANGIOGRAPHY;  Surgeon: Cherrie Toribio SAUNDERS, MD;  Location: [facility] INVASIVE CV LAB;  Service: Cardiovascular;  Laterality: N/A;  Renal injection preformed    RIGHT/LEFT HEART CATH AND CORONARY ANGIOGRAPHY N/A 10/25/2020   Procedure: RIGHT/LEFT HEART CATH AND CORONARY ANGIOGRAPHY;  Surgeon: Anner Alm ORN, MD;  Location: [facility] INVASIVE CV LAB;  Service: Cardiovascular;  Laterality: N/A;   TENDON RELEASE Right 2012   hand   TUBAL LIGATION  05/11/2011   Procedure: POST PARTUM TUBAL LIGATION;  Surgeon: Lynwood Solomons, MD;  Location: [facility];  Service: Gynecology;  Laterality: Bilateral;  Induced for HTN    Current Medications: No outpatient medications have been marked as taking for the 01/15/24 encounter (Appointment) with Raford Riggs, MD.     Allergies:   Aspirin , Bactrim  [sulfamethoxazole-trimethoprim], Doxycycline , Kiwi extract, Metoprolol , Penicillins, and Strawberry extract   Social History   Socioeconomic History   Marital status: Single    Spouse name: Not  on file   Number of children: 3   Years of education: Not on file   Highest education level: Bachelor's degree (e.g., BA, AB, BS)  Occupational History    Employer: UNEMPLOYED  Tobacco Use   Smoking status: Never   Smokeless tobacco: Never  Vaping Use   Vaping status: Never Used  Substance and Sexual Activity   Alcohol use: Yes    Comment: occasionally/socially   Drug use: No   Sexual activity: Yes    Birth control/protection: Surgical, None  Other Topics Concern   Not on file  Social History Narrative   She lives with four children.   She is currently not working (disbaility pending).   Left handed   One story home   Drinks caffeine      Social Drivers of Health   Financial Resource Strain: High Risk (12/15/2023)   Overall Financial Resource Strain (CARDIA)    Difficulty of Paying Living Expenses: Very hard  Food Insecurity: Food Insecurity Present (12/15/2023)   Hunger Vital Sign    Worried About Running Out of Food in the Last Year: Often true    Ran Out of Food in the Last Year: Not on file  Transportation Needs: No Transportation Needs (12/15/2023)   PRAPARE - Administrator, Civil Service (Medical): No    Lack of Transportation (Non-Medical): No  Physical Activity: Unknown (12/15/2023)   Exercise Vital Sign    Days of Exercise per Week: Patient declined    Minutes of Exercise per Session: Not on file  Stress: Stress Concern Present (12/15/2023)   [employer] of Occupational Health - Occupational Stress Questionnaire    Feeling of Stress: Very much  Social Connections: Unknown (12/15/2023)   Social Connection and Isolation Panel    Frequency of Communication with Friends and Family: Patient declined    Frequency of Social Gatherings with Friends and  Family: Never    Attends Religious Services: Patient declined    Database administrator or Organizations: Patient declined    Attends Engineer, structural: Not on file    Marital Status: Never married    Family History: The patient's family history includes Breast cancer in some other family members; CAD in her maternal grandmother; Diabetes in her maternal grandmother and mother; Fibroids (age of onset: 44) in her sister; Heart disease in her maternal grandmother; Hypertension in her maternal grandmother; Other (age of onset: 89) in her mother. There is no history of Cancer, Alcohol abuse, Early death, Hyperlipidemia, Kidney disease, Stroke, Colon cancer, Esophageal cancer, Stomach cancer, Rectal cancer, or Colon polyps.  ROS:   Please see the history of present illness.    (+) Right shoulder pain (+) Shortness of breath (+) Bilateral LE edema All other systems reviewed and are negative.  EKGs/Labs/Other Studies Reviewed:    Echo 04/05/2021: Sonographer Comments: Patient is morbidly obese. Image acquisition  challenging due to patient body habitus.  IMPRESSIONS    1. Left ventricular ejection fraction, by estimation, is 55 to 60%. The  left ventricle has normal function. The left ventricle has no regional  wall motion abnormalities. There is mild left ventricular hypertrophy.  Left ventricular diastolic parameters  were normal. The average left ventricular global longitudinal strain is  -15.0 %. The global longitudinal strain is abnormal.   2. Right ventricular systolic function is normal. The right ventricular  size is mildly enlarged. There is mildly elevated pulmonary artery  systolic pressure. The estimated right ventricular systolic pressure is  40.2 mmHg.   3. Left atrial size was moderately dilated.   4. Right atrial size was moderately dilated.   5. The mitral valve is normal in structure. Mild mitral valve  regurgitation. No evidence of mitral stenosis.   6. The  aortic valve is normal in structure. Aortic valve regurgitation is  not visualized. No aortic stenosis is present.   7. The inferior vena cava is normal in size with greater than 50%  respiratory variability, suggesting right atrial pressure of 3 mmHg.   Comparison(s): EF 45%, moderate LVH, GLS -13.1%.   Right/Left Heart Cath 10/25/2020:   Dist LAD lesion is 80% stenosed.   LPAV lesion is 30% stenosed with 50% stenosed side branch in 1st LPL.   There is hyperdynamic left ventricular systolic function.  The left ventricular ejection fraction is greater than 65% by visual estimate.   Hemodynamic findings consistent with mild (secondary) pulmonary hypertension - LVEDP ~25 mmHg, PCWP ~19-20 mmHg   Normal Cardiac Output / Index (7.91 / 3.94)   POST-OPERATIVE DIAGNOSIS:              Stable single-vessel CAD with apical LAD 80% stenosis-no change from previous cath.  Otherwise very tortuous coronary arteries with no significant stenoses.            Mild (likely secondary) Pulmonary Hypertension with PA mean pressure of 28 mmHg (PAP 39/15 mmHg), and PCWP of 19 mmHg with LVEDP of 25 mmHg.            Significant systolic hypertension with initial pressures in the 200s over 110s, however after medication 152/78 mmHg with MAP of 109 mmHg; LVP/EDP 155/13 mmHg - 25 mmHg.            Ao sat 99%, PA sat 81%.  Fick Cardiac Output-Index: 7.91-3.84 (normal)            Suspect angina is due to accelerated hypertension, increased wall stress and microvascular ischemia.  Distal LAD lesion is not favorable for PCI (however if pain is intractable, could be considered)  Diagnostic Dominance: Right    Monitor 03/23/2020: 3 Day Zio Monitor   Quality: Fair.  Baseline artifact. Predominant rhythm: sinus Average heart rate: 84 bpm Max heart rate: 140 bpm Min heart rate: 57 bpm Pauses >2.5 seconds: none Occasional PVCs and ventricular bigeminy Two runs of SVT up to 8 beats.  Rate 224 bpm.    LHC/RHC  05/04/19: Dist LAD-1 lesion is 30% stenosed. Dist LAD-2 lesion is 80% stenosed. Dist Cx lesion is 60% stenosed.   Findings:   Ao = 173/104 (134) LV =  181/25 RA =  10 RV =  41/13 PA =  42/10 (26) PCW = 18 Fick cardiac output/index = 5.8/2.9 PVR = 1.4 wu Ao sat = 98% PA sat = 73%, 73%   Assessment: 1. 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 LVEF 55-60% 3. Mildly elevated filling pressures with mild pulmonary venous HTN m4. Widely patent bilateral renal arteries. No evidence of AAA 5. Severe systemic HTN   Renal artery Doppler 10/29/18: Right: 1-59% stenosis of the right renal artery. Cyst(s) noted.  Left:  1-59% stenosis of the left renal artery.    Lexiscan  Myoview  04/20/18: The left ventricular ejection fraction is moderately decreased (30-44%). Nuclear stress EF: 41%. There was no ST segment deviation noted during stress. This is an intermediate risk study.   Abnormal, intermediate risk stress nuclear study with no ischemia or infarction; EF 41 with global hypokinesis and mild LVE;  study intermediate risk due to reduced LV function.  EKG:   04/23/19: sinus tachycardia.  Rate 124 bpm.  LVH  EKG Interpretation Date/Time:    Ventricular Rate:    PR Interval:    QRS Duration:    QT Interval:    QTC Calculation:   R Axis:      Text Interpretation:           Recent Labs: 10/21/2023: ALT 31; BUN 8; Creatinine 0.94; Hemoglobin 14.4; Platelet Count 232; Potassium 3.6; Sodium 143   Recent Lipid Panel    Component Value Date/Time   CHOL 137 09/25/2023 1440   TRIG 103 09/25/2023 1440   HDL 48 09/25/2023 1440   CHOLHDL 2.9 09/25/2023 1440   CHOLHDL 4 10/01/2018 1115   VLDL 22.6 10/01/2018 1115   LDLCALC 70 09/25/2023 1440   LDLDIRECT 115 (H) 09/17/2021 1001   LDLDIRECT 152.0 03/30/2018 1604    Physical Exam:    Wt Readings from Last 3 Encounters:  12/30/23 220 lb (99.8 kg)  12/17/23 220 lb (99.8 kg)  11/25/23 220 lb (99.8 kg)    VS:  LMP  (LMP Unknown)  ,  BMI There is no height or weight on file to calculate BMI. GENERAL:  Well appearing HEENT: Pupils equal round and reactive, fundi not visualized, oral mucosa unremarkable NECK:  No jugular venous distention, waveform within normal limits, carotid upstroke brisk and symmetric, no bruits LUNGS:  Clear to auscultation bilaterally HEART:  RRR.  PMI not displaced or sustained,S1 and S2 within normal limits, no S3, no S4, no clicks, no rubs, no murmurs ABD:  Non-tender.  positive bowel sounds normal in frequency in pitch, no bruits, no rebound, no guarding, no midline pulsatile mass, no hepatomegaly, no splenomegaly EXT:  2 plus pulses throughout, no edema , no cyanosis no clubbing SKIN:  No rashes no nodules; Blistering lesion on left thigh. NEURO:  Cranial nerves II through XII grossly intact, motor grossly intact throughout PSYCH:  Cognitively intact, oriented to person place and time   ASSESSMENT:    1. Resistant hypertension   2. Acute on chronic diastolic heart failure (HCC)   3. Malignant hypertension   4. SVT (supraventricular tachycardia)   5. Coronary artery disease of native artery of native heart with stable angina pectoris   6. OSA (obstructive sleep apnea)   7. Hyperlipidemia LDL goal <100     PLAN:    Assessment & Plan     No BP recorded.  {Refresh Note OR Click here to enter BP  :1}*** Disposition: FU with APP in 4 months.  Medication Adjustments/Labs and Tests Ordered: Current medicines are reviewed at length with the patient today.  Concerns regarding medicines are outlined above.   No orders of the defined types were placed in this encounter.  No orders of the defined types were placed in this encounter.   Signed, Annabella Scarce, MD  01/14/2024 11:42 PM    [facility]

## 2024-01-15 ENCOUNTER — Encounter (HOSPITAL_BASED_OUTPATIENT_CLINIC_OR_DEPARTMENT_OTHER): Admitting: Cardiovascular Disease

## 2024-01-15 DIAGNOSIS — G4733 Obstructive sleep apnea (adult) (pediatric): Secondary | ICD-10-CM

## 2024-01-15 DIAGNOSIS — E785 Hyperlipidemia, unspecified: Secondary | ICD-10-CM

## 2024-01-15 DIAGNOSIS — I5033 Acute on chronic diastolic (congestive) heart failure: Secondary | ICD-10-CM

## 2024-01-15 DIAGNOSIS — I1A0 Resistant hypertension: Secondary | ICD-10-CM

## 2024-01-15 DIAGNOSIS — I10 Essential (primary) hypertension: Secondary | ICD-10-CM

## 2024-01-15 DIAGNOSIS — I471 Supraventricular tachycardia, unspecified: Secondary | ICD-10-CM

## 2024-01-15 DIAGNOSIS — I25118 Atherosclerotic heart disease of native coronary artery with other forms of angina pectoris: Secondary | ICD-10-CM

## 2024-01-28 ENCOUNTER — Other Ambulatory Visit (HOSPITAL_BASED_OUTPATIENT_CLINIC_OR_DEPARTMENT_OTHER): Payer: Self-pay | Admitting: Family Medicine

## 2024-02-06 ENCOUNTER — Other Ambulatory Visit (HOSPITAL_BASED_OUTPATIENT_CLINIC_OR_DEPARTMENT_OTHER): Payer: Self-pay | Admitting: Family

## 2024-02-10 ENCOUNTER — Inpatient Hospital Stay: Attending: Adult Health

## 2024-02-10 VITALS — BP 196/85 | HR 74 | Resp 18

## 2024-02-10 DIAGNOSIS — Z171 Estrogen receptor negative status [ER-]: Secondary | ICD-10-CM | POA: Insufficient documentation

## 2024-02-10 DIAGNOSIS — C50412 Malignant neoplasm of upper-outer quadrant of left female breast: Secondary | ICD-10-CM | POA: Diagnosis present

## 2024-02-10 DIAGNOSIS — Z5111 Encounter for antineoplastic chemotherapy: Secondary | ICD-10-CM | POA: Insufficient documentation

## 2024-02-10 DIAGNOSIS — Z923 Personal history of irradiation: Secondary | ICD-10-CM | POA: Insufficient documentation

## 2024-02-10 DIAGNOSIS — N920 Excessive and frequent menstruation with regular cycle: Secondary | ICD-10-CM | POA: Insufficient documentation

## 2024-02-10 DIAGNOSIS — I10 Essential (primary) hypertension: Secondary | ICD-10-CM | POA: Diagnosis not present

## 2024-02-10 MED ORDER — GOSERELIN ACETATE 3.6 MG ~~LOC~~ IMPL
3.6000 mg | DRUG_IMPLANT | SUBCUTANEOUS | Status: DC
  Administered 2024-02-10: 3.6 mg via SUBCUTANEOUS
  Filled 2024-02-10: qty 3.6

## 2024-03-07 ENCOUNTER — Other Ambulatory Visit: Payer: Self-pay | Admitting: Cardiovascular Disease

## 2024-03-07 DIAGNOSIS — E876 Hypokalemia: Secondary | ICD-10-CM

## 2024-03-09 ENCOUNTER — Inpatient Hospital Stay

## 2024-03-09 ENCOUNTER — Inpatient Hospital Stay: Attending: Adult Health

## 2024-03-09 ENCOUNTER — Inpatient Hospital Stay: Attending: Adult Health | Admitting: Adult Health

## 2024-03-09 ENCOUNTER — Encounter: Payer: Self-pay | Admitting: Adult Health

## 2024-03-09 ENCOUNTER — Inpatient Hospital Stay: Admitting: Adult Health

## 2024-03-09 VITALS — BP 212/109 | HR 91 | Temp 98.7°F | Resp 17 | Ht 66.0 in | Wt 218.3 lb

## 2024-03-09 VITALS — BP 181/103

## 2024-03-09 DIAGNOSIS — N920 Excessive and frequent menstruation with regular cycle: Secondary | ICD-10-CM | POA: Insufficient documentation

## 2024-03-09 DIAGNOSIS — C50412 Malignant neoplasm of upper-outer quadrant of left female breast: Secondary | ICD-10-CM

## 2024-03-09 DIAGNOSIS — R234 Changes in skin texture: Secondary | ICD-10-CM | POA: Insufficient documentation

## 2024-03-09 DIAGNOSIS — Z79818 Long term (current) use of other agents affecting estrogen receptors and estrogen levels: Secondary | ICD-10-CM | POA: Insufficient documentation

## 2024-03-09 DIAGNOSIS — N644 Mastodynia: Secondary | ICD-10-CM | POA: Insufficient documentation

## 2024-03-09 DIAGNOSIS — Z171 Estrogen receptor negative status [ER-]: Secondary | ICD-10-CM

## 2024-03-09 DIAGNOSIS — Z9221 Personal history of antineoplastic chemotherapy: Secondary | ICD-10-CM | POA: Insufficient documentation

## 2024-03-09 DIAGNOSIS — I10 Essential (primary) hypertension: Secondary | ICD-10-CM | POA: Diagnosis not present

## 2024-03-09 DIAGNOSIS — Z923 Personal history of irradiation: Secondary | ICD-10-CM | POA: Insufficient documentation

## 2024-03-09 LAB — CBC WITH DIFFERENTIAL (CANCER CENTER ONLY)
Abs Immature Granulocytes: 0.02 K/uL (ref 0.00–0.07)
Basophils Absolute: 0 K/uL (ref 0.0–0.1)
Basophils Relative: 0 %
Eosinophils Absolute: 0.1 K/uL (ref 0.0–0.5)
Eosinophils Relative: 2 %
HCT: 42.3 % (ref 36.0–46.0)
Hemoglobin: 14 g/dL (ref 12.0–15.0)
Immature Granulocytes: 0 %
Lymphocytes Relative: 34 %
Lymphs Abs: 2.2 K/uL (ref 0.7–4.0)
MCH: 29.7 pg (ref 26.0–34.0)
MCHC: 33.1 g/dL (ref 30.0–36.0)
MCV: 89.6 fL (ref 80.0–100.0)
Monocytes Absolute: 0.5 K/uL (ref 0.1–1.0)
Monocytes Relative: 7 %
Neutro Abs: 3.7 K/uL (ref 1.7–7.7)
Neutrophils Relative %: 57 %
Platelet Count: 214 K/uL (ref 150–400)
RBC: 4.72 MIL/uL (ref 3.87–5.11)
RDW: 13.9 % (ref 11.5–15.5)
WBC Count: 6.6 K/uL (ref 4.0–10.5)
nRBC: 0 % (ref 0.0–0.2)

## 2024-03-09 LAB — CMP (CANCER CENTER ONLY)
ALT: 29 U/L (ref 0–44)
AST: 27 U/L (ref 15–41)
Albumin: 4.3 g/dL (ref 3.5–5.0)
Alkaline Phosphatase: 80 U/L (ref 38–126)
Anion gap: 10 (ref 5–15)
BUN: 12 mg/dL (ref 6–20)
CO2: 30 mmol/L (ref 22–32)
Calcium: 9.5 mg/dL (ref 8.9–10.3)
Chloride: 105 mmol/L (ref 98–111)
Creatinine: 0.9 mg/dL (ref 0.44–1.00)
GFR, Estimated: >60 mL/min (ref >60)
Glucose, Bld: 103 mg/dL — ABNORMAL HIGH (ref 70–99)
Potassium: 3.5 mmol/L (ref 3.5–5.1)
Sodium: 144 mmol/L (ref 135–145)
Total Bilirubin: 0.5 mg/dL (ref 0.0–1.2)
Total Protein: 8.1 g/dL (ref 6.5–8.1)

## 2024-03-09 MED ORDER — GOSERELIN ACETATE 3.6 MG ~~LOC~~ IMPL
3.6000 mg | DRUG_IMPLANT | SUBCUTANEOUS | Status: DC
  Administered 2024-03-09: 3.6 mg via SUBCUTANEOUS
  Filled 2024-03-09: qty 3.6

## 2024-03-09 NOTE — Progress Notes (Signed)
 [facility] Cancer Follow up:    Erica Thersia Bitters, FNP [address] [place] [address]   DIAGNOSIS:  Cancer Staging  Malignant neoplasm of upper-outer quadrant of left breast in female, estrogen receptor negative (HCC) Staging form: Breast, AJCC 7th Edition - Clinical: Stage IIB (T2, N1, M0) - Signed by Layla Sandria BROCKS, MD on 10/20/2014    SUMMARY OF ONCOLOGIC HISTORY: Oncology History  Malignant neoplasm of upper-outer quadrant of left breast in female, estrogen receptor negative (HCC)   Neo-Adjuvant Chemotherapy   Docetaxel and cyclophosphamide x 4   09/2008 Definitive Surgery   Left lumpectomy/SLNB: IDC, ER/PR/ HER2 neu negative, Ki67 88%   09/2008 Pathologic Stage   Stage IIB: ypT2 ypN1   10/2008 Surgery   Full axillary dissection and re-excision of margins    - 03/2009 Chemotherapy   Paclitaxel weekly x 11    - 05/2009 Radiation Therapy   Adjuvant RT to left breast     CURRENT THERAPY:  INTERVAL HISTORY:  Discussed the use of AI scribe software for clinical note transcription with the patient, who gave verbal consent to proceed.  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 She remains on observation, with her last oncology visit in July 2025. Her most recent mammogram in May 2024 was unremarkable.  She notes new pain and localized skin thickening in the lateral left breast. She describes a thin texture with overlying skin thickening in this area. She is unsure if these findings are new or previously present.  She continues Zoladex  for menorrhagia.  Prior attempts to stop Zoladex  caused heavy vaginal bleeding with large clots, syncope, and worsening anemia, so she has remained on therapy. She has no new or worsening menstrual bleeding symptoms today.  She has had difficulty with follow up with her PCP.    She takes clonidine  for hypertension and vitamin D  supplementation. Clinic blood pressure is markedly elevated, though she reports lower readings at home. She denies new chest pain, palpitations, dyspnea, headaches, vision changes, or focal neurologic symptoms beyond her baseline.     Patient Active Problem List   Diagnosis Date Noted   Chronic fatigue 12/17/2023   Prediabetes 06/13/2023   Moderate episode of recurrent major depressive disorder (HCC) 02/04/2023   GAD (generalized anxiety disorder) 02/04/2023   Chronic respiratory failure with hypoxia (HCC) 12/31/2021   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   Coronary artery disease    Chronic stable angina 08/22/2020   SVT (supraventricular tachycardia) 08/22/2020   Palpitations 02/28/2020   Right ankle pain 04/14/2019   OSA (obstructive sleep apnea) 10/01/2018   Tibialis posterior tendinitis, left 07/15/2018   Chronic pain in left foot 06/29/2018   Vitamin D  deficiency disease 03/31/2018   Persistent proteinuria 03/31/2018   Hyperlipidemia LDL goal <100 03/30/2018   Chemotherapy-induced peripheral neuropathy 12/09/2016   Asthma in adult, mild persistent, uncomplicated 01/19/2016   Hypertensive heart disease 09/26/2015   Depression with somatization 06/15/2015   Hypertriglyceridemia without hypercholesterolemia 06/15/2015   Hypokalemia 06/15/2015   Insomnia secondary to anxiety 04/05/2015   Idiopathic intracranial hypertension 10/21/2014   Upper airway cough syndrome 10/09/2014   Malignant hypertension 10/09/2014  Morbid obesity due to excess calories (HCC) 10/09/2014   Apnea, sleep 05/04/2014   Acute on chronic diastolic CHF (congestive heart failure), NYHA class 2 (HCC) 05/03/2014    DOE (dyspnea on exertion) 07/14/2013   Acute on chronic diastolic heart failure (HCC) 06/23/2013   Malignant neoplasm of upper-outer quadrant of left breast in female, estrogen receptor negative (HCC) 01/07/2013   Gastroparesis 01/04/2013   Resistant hypertension 09/17/2011   Axillary hidradenitis suppurativa 08/01/2011   Preventative health care 04/13/2011   Hyperglycemia 04/13/2011   GERD (gastroesophageal reflux disease) 03/18/2011   Cough variant asthma 09/27/2010    is allergic to aspirin , bactrim [sulfamethoxazole-trimethoprim], doxycycline , kiwi extract, metoprolol , penicillins, and strawberry extract.  MEDICAL HISTORY: Past Medical History:  Diagnosis Date   Allergy     Anxiety    Apnea, sleep 05/04/2014   AHI 31/hr now on CPAP at 12cm H2O   Arthritis    Asthma    Asthma without status asthmaticus 01/19/2016   Axillary hidradenitis suppurativa 08/01/2011   Blood transfusion without reported diagnosis    Breast cancer (HCC) 2010   a. locally advanced left breast carcinoma diagnosed in 2010 and treated with neoadjuvant chemotherapy with docetaxel and cyclophosphamide as well as paclitaxel. This was followed by radiation therapy which was completed in 2011.   Chemotherapy-induced peripheral neuropathy 12/09/2016   CHF (congestive heart failure) (HCC)    Chronic diastolic CHF (congestive heart failure) (HCC) 06/23/2013   Chronic kidney disease    Chronic pain in left foot 06/29/2018   Chronic stable angina 08/22/2020   Cough variant asthma 09/27/2010   Followed in Pulmonary clinic/ [facility]/ [facility]  Onset reported in childhood  - PFT's 01/31/2011 FEV1  1.76 (60% ) ratio 68 and no better p B2,  DLCO 70% -med review 09/25/2012 > [place]  drug assistance  paperwork given    - HFA 90% 11/20/2012 .  -Med calendar 12/07/12 , 04/06/2013 , 04/18/2014,, 08/30/2014 , 11/08/2014  - PFT's 01/12/2013  1.73 (65%)  Ratio 73 and no better p B2, DLCO 86% - trial off    Depression with somatization  06/15/2015   Dyspnea on exertion    a. 01/2013 Lexi MV: EF 54%, no ischemia/infarct;  b. 05/2013 Echo: EF 60-65%, no rwma, Gr 2 DD;  b.    Essential hypertension 10/09/2014   Estimated Creatinine Clearance: 82.4 mL/min (by C-G formula based on SCr of 0.98 mg/dL).   Fatty liver    Gastroparesis    GERD (gastroesophageal reflux disease)    Hyperglycemia 04/13/2011   Hypertension    Hypertensive heart disease 09/26/2015   Hypertriglyceridemia without hypercholesterolemia 06/15/2015   Framingham risk score is 1%    Idiopathic intracranial hypertension 10/21/2014   Impaired glucose tolerance 04/13/2011   Insomnia secondary to anxiety 04/05/2015   Lymphadenitis, chronic    restricted LEFT extremity   Malignant neoplasm of upper-outer quadrant of left breast in female, estrogen receptor negative (HCC) 01/07/2013   Stage III, Invasive Ductal, s/p partial Left Mastectomy/Lumpectomy (baseball sized) with Lymph Node dissection, had Chemo and RadRx     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/2010   OSA (obstructive sleep apnea) 05/04/2014   AHI 31/hr now on CPAP at 12cm H2O   Oxygen deficiency    Uses 02 in CPAP   Persistent proteinuria 03/31/2018   Personal history of chemotherapy 2010   Personal history of radiation therapy 2010   Resistant hypertension 09/17/2011       Sleep apnea    SVT (supraventricular tachycardia) 08/22/2020  Tibialis posterior tendinitis, left 07/15/2018   Injected in March 10, 2019   Ulcer    Upper airway cough syndrome 10/09/2014   Onset around 2012 with assoc pseudowheeze MRI 05/18/15 > Paranasal sinuses are clear - 11/17/2015 rec increase h1 to 2 hs to help with noct cough  - Allergy  profile 11/17/2015 >  Eos 0.1 /  IgE  80 POS RAST  to grass, ragweed, cedar tree  - d/c acei 03/06/2018  - flare end of Jan 2020 in setting of ? Uri/ poorly controlled GERD - flare mid Oct 2020 with uri / assoc secondary gerd   Vitamin D  deficiency disease 03/31/2018    SURGICAL HISTORY: Past  Surgical History:  Procedure Laterality Date   BRAIN SURGERY     Shunt   BREAST LUMPECTOMY Left 09/2008   CARPAL TUNNEL RELEASE Left 2011   LYMPH NODE DISSECTION Left 2010   breast; 2 wks after breast lumpectomy   RENAL DENERVATION N/A 11/20/2022   Procedure: RENAL DENERVATION;  Surgeon: Darron Deatrice LABOR, MD;  Location: [facility] INVASIVE CV LAB;  Service: Cardiovascular;  Laterality: N/A;   RIGHT/LEFT HEART CATH AND CORONARY ANGIOGRAPHY N/A 05/04/2019   Procedure: RIGHT/LEFT HEART CATH AND CORONARY ANGIOGRAPHY;  Surgeon: Cherrie Toribio SAUNDERS, MD;  Location: [facility] INVASIVE CV LAB;  Service: Cardiovascular;  Laterality: N/A;  Renal injection preformed    RIGHT/LEFT HEART CATH AND CORONARY ANGIOGRAPHY N/A 10/25/2020   Procedure: RIGHT/LEFT HEART CATH AND CORONARY ANGIOGRAPHY;  Surgeon: Anner Alm ORN, MD;  Location: [facility] INVASIVE CV LAB;  Service: Cardiovascular;  Laterality: N/A;   TENDON RELEASE Right 2012   hand   TUBAL LIGATION  05/11/2011   Procedure: POST PARTUM TUBAL LIGATION;  Surgeon: Lynwood Solomons, MD;  Location: [facility] OR[facility];  Service: Gynecology;  Laterality: Bilateral;  Induced for HTN    SOCIAL HISTORY: Social History   Socioeconomic History   Marital status: Single    Spouse name: Not on file   Number of children: 3   Years of education: Not on file   Highest education level: Bachelor's degree (e.g., BA, AB, BS)  Occupational History    Employer: UNEMPLOYED  Tobacco Use   Smoking status: Never   Smokeless tobacco: Never  Vaping Use   Vaping status: Never Used  Substance and Sexual Activity   Alcohol use: Yes    Comment: occasionally/socially   Drug use: No   Sexual activity: Yes    Birth control/protection: Surgical, None  Other Topics Concern   Not on file  Social History Narrative   She lives with four children.   She is currently not working (disbaility pending).   Left handed   One story home   Drinks caffeine      Social Drivers of Health   Financial Resource Strain:  Low Risk  (03/09/2024)   Overall Financial Resource Strain (CARDIA)    Difficulty of Paying Living Expenses: Not hard at all  Recent Concern: Financial Resource Strain - High Risk (12/15/2023)   Overall Financial Resource Strain (CARDIA)    Difficulty of Paying Living Expenses: Very hard  Food Insecurity: No Food Insecurity (03/09/2024)   Hunger Vital Sign    Worried About Running Out of Food in the Last Year: Never true    Ran Out of Food in the Last Year: Never true  Recent Concern: Food Insecurity - Food Insecurity Present (12/15/2023)   Hunger Vital Sign    Worried About Running Out of Food in the Last Year: Often true  Ran Out of Food in the Last Year: Not on file  Transportation Needs: Unmet Transportation Needs (03/09/2024)   PRAPARE - Transportation    Lack of Transportation (Medical): Yes    Lack of Transportation (Non-Medical): Yes  Physical Activity: Unknown (12/15/2023)   Exercise Vital Sign    Days of Exercise per Week: Patient declined    Minutes of Exercise per Session: Not on file  Stress: Stress Concern Present (03/09/2024)   [employer] of Occupational Health - Occupational Stress Questionnaire    Feeling of Stress: To some extent  Social Connections: Unknown (12/15/2023)   Social Connection and Isolation Panel    Frequency of Communication with Friends and Family: Patient declined    Frequency of Social Gatherings with Friends and Family: Never    Attends Religious Services: Patient declined    Database Administrator or Organizations: Patient declined    Attends Banker Meetings: Not on file    Marital Status: Never married  Intimate Partner Violence: Not At Risk (03/09/2024)   Humiliation, Afraid, Rape, and Kick questionnaire    Fear of Current or Ex-Partner: No    Emotionally Abused: No    Physically Abused: No    Sexually Abused: No    FAMILY HISTORY: Family History  Problem Relation Age of Onset   Other Mother 38       breast  calcifications treated with surgery, breast cancer pill, and radiation   Diabetes Mother    Fibroids Sister 86       s/p TAH-BSO   Diabetes Maternal Grandmother    Heart disease Maternal Grandmother    Hypertension Maternal Grandmother    CAD Maternal Grandmother    Breast cancer Other        maternal great aunt (MGM's sister)   Breast cancer Other        paternal great aunt dx middle ages; s/p mastectomy   Cancer Neg Hx    Alcohol abuse Neg Hx    Early death Neg Hx    Hyperlipidemia Neg Hx    Kidney disease Neg Hx    Stroke Neg Hx    Colon cancer Neg Hx    Esophageal cancer Neg Hx    Stomach cancer Neg Hx    Rectal cancer Neg Hx    Colon polyps Neg Hx     Review of Systems  Constitutional:  Negative for appetite change, chills, fatigue, fever and unexpected weight change.  HENT:   Negative for hearing loss, lump/mass and trouble swallowing.   Eyes:  Negative for eye problems and icterus.  Respiratory:  Negative for chest tightness, cough and shortness of breath.   Cardiovascular:  Negative for chest pain, leg swelling and palpitations.  Gastrointestinal:  Negative for abdominal distention, abdominal pain, constipation, diarrhea, nausea and vomiting.  Endocrine: Negative for hot flashes.  Genitourinary:  Negative for difficulty urinating.   Musculoskeletal:  Negative for arthralgias.  Skin:  Negative for itching and rash.  Neurological:  Negative for dizziness, extremity weakness, headaches and numbness.  Hematological:  Negative for adenopathy. Does not bruise/bleed easily.  Psychiatric/Behavioral:  Negative for depression. The patient is not nervous/anxious.       PHYSICAL EXAMINATION    Vitals:   03/09/24 0939  BP: (!) 181/103    Physical Exam Constitutional:      General: She is not in acute distress.    Appearance: Normal appearance. She is not toxic-appearing.  HENT:     Head: Normocephalic and atraumatic.  Mouth/Throat:     Mouth: Mucous membranes  are moist.     Pharynx: Oropharynx is clear. No oropharyngeal exudate or posterior oropharyngeal erythema.  Eyes:     General: No scleral icterus. Cardiovascular:     Rate and Rhythm: Normal rate and regular rhythm.     Pulses: Normal pulses.     Heart sounds: Normal heart sounds.  Pulmonary:     Effort: Pulmonary effort is normal.     Breath sounds: Normal breath sounds.  Chest:     Comments: Left breast s/p lumpectomy and radiation, focal tenderness noted in LUOQ towards axillary tail, no nodules or masses noted; right breast benign Abdominal:     General: Abdomen is flat. Bowel sounds are normal. There is no distension.     Palpations: Abdomen is soft.     Tenderness: There is no abdominal tenderness.  Musculoskeletal:        General: No swelling.     Cervical back: Neck supple.  Lymphadenopathy:     Cervical: No cervical adenopathy.     Upper Body:     Right upper body: No supraclavicular or axillary adenopathy.     Left upper body: No supraclavicular or axillary adenopathy.  Skin:    General: Skin is warm and dry.     Findings: No rash.  Neurological:     General: No focal deficit present.     Mental Status: She is alert.  Psychiatric:        Mood and Affect: Mood normal.        Behavior: Behavior normal.     LABORATORY DATA:  CBC    Component Value Date/Time   WBC 6.6 03/09/2024 0823   WBC 6.6 12/25/2022 1115   RBC 4.72 03/09/2024 0823   HGB 14.0 03/09/2024 0823   HGB 13.5 11/28/2022 1355   HGB 13.8 03/17/2017 0835   HCT 42.3 03/09/2024 0823   HCT 40.8 11/28/2022 1355   HCT 41.8 03/17/2017 0835   PLT 214 03/09/2024 0823   PLT 213 11/28/2022 1355   MCV 89.6 03/09/2024 0823   MCV 90 11/28/2022 1355   MCV 88.0 03/17/2017 0835   MCH 29.7 03/09/2024 0823   MCHC 33.1 03/09/2024 0823   RDW 13.9 03/09/2024 0823   RDW 13.3 11/28/2022 1355   RDW 14.5 03/17/2017 0835   LYMPHSABS 2.2 03/09/2024 0823   LYMPHSABS 2.3 02/28/2020 0841   LYMPHSABS 1.6 03/17/2017  0835   MONOABS 0.5 03/09/2024 0823   MONOABS 0.3 03/17/2017 0835   EOSABS 0.1 03/09/2024 0823   EOSABS 0.2 02/28/2020 0841   BASOSABS 0.0 03/09/2024 0823   BASOSABS 0.0 02/28/2020 0841   BASOSABS 0.1 03/17/2017 0835    CMP     Component Value Date/Time   NA 144 03/09/2024 0823   NA 144 09/25/2023 1440   NA 142 03/17/2017 0835   K 3.5 03/09/2024 0823   K 3.6 03/17/2017 0835   CL 105 03/09/2024 0823   CL 103 09/10/2012 1618   CO2 30 03/09/2024 0823   CO2 27 03/17/2017 0835   GLUCOSE 103 (H) 03/09/2024 0823   GLUCOSE 114 03/17/2017 0835   GLUCOSE 86 09/10/2012 1618   BUN 12 03/09/2024 0823   BUN 13 09/25/2023 1440   BUN 10.1 03/17/2017 0835   CREATININE 0.90 03/09/2024 0823   CREATININE 0.9 03/17/2017 0835   CALCIUM  9.5 03/09/2024 0823   CALCIUM  9.5 03/17/2017 0835   PROT 8.1 03/09/2024 0823   PROT 7.4 09/25/2023 1440  PROT 7.7 03/17/2017 0835   ALBUMIN 4.3 03/09/2024 0823   ALBUMIN 4.4 09/25/2023 1440   ALBUMIN 3.7 03/17/2017 0835   AST 27 03/09/2024 0823   AST 17 03/17/2017 0835   ALT 29 03/09/2024 0823   ALT 24 03/17/2017 0835   ALKPHOS 80 03/09/2024 0823   ALKPHOS 77 03/17/2017 0835   BILITOT 0.5 03/09/2024 0823   BILITOT 0.50 03/17/2017 0835   GFRNONAA >60 03/09/2024 0823   GFRAA 72 04/10/2020 0942   GFRAA 59 (L) 12/09/2018 1222     ASSESSMENT and THERAPY PLAN:   Assessment & Plan Stage IIB triple negative invasive ductal carcinoma of the left breast Diagnosed in 2010, post-treatment with neoadjuvant chemotherapy, lumpectomy, adjuvant Taxol, and radiation. No new palpable mass; localized skin thickening likely scar tissue. New focal pain and skin changes warrant further evaluation. - Ordered diagnostic mammogram and breast ultrasound. - Reviewed healthy diet and exercise recommendations.    Heavy menstrual bleeding with anemia Heavy menstrual bleeding with anemia, managed with Zoladex . Discontinuation attempts led to hemorrhage and anemia. Zoladex   continued for cycle control. - Continued Zoladex  for menstrual suppression. - Recommended f/u with GYN to discuss non-pharmacologic management of heavy menses  Hypertension Hypertension with elevated blood pressure in clinic, lower readings reported at home. (History of resistant HTN with close f/u with HTN clinic) - Rechecked blood pressure in clinic. - Recommended continued BP monitoring at home and f/u with cardiology     All questions were answered. The patient knows to call the clinic with any problems, questions or concerns. We can certainly see the patient much sooner if necessary.  Total encounter time:20 minutes*in face-to-face visit time, chart review, lab review, care coordination, order entry, and documentation of the encounter time.    Morna Kendall, NP 03/09/24 4:31 PM Medical Oncology and Hematology [facility] [address] [place], [place] [address] Tel. [phone]    Fax. [phone]  *Total Encounter Time as defined by the Centers for Medicare and Medicaid Services includes, in addition to the face-to-face time of a patient visit (documented in the note above) non-face-to-face time: obtaining and reviewing outside history, ordering and reviewing medications, tests or procedures, care coordination (communications with other health care professionals or caregivers) and documentation in the medical record.

## 2024-03-09 NOTE — Progress Notes (Signed)
 Follow-Up Visit Prior to Zoladex  Injection  Intake Findings:  Patient reports a knot on the left side under surgical site.  Blood pressure elevated; rechecked.  History of hypertension, currently on multiple medications.  Patient did not take BP medications today and has not eaten breakfast.  Patient noted to be wearing two clonidine  patches. Provider aware of elevated BP.  All intake questions reviewed and answered.  Oncology Visit:  Patient roomed for oncology evaluation.  Vital signs collected; blood pressure abnormal.  No acute distress observed.  Patient stable and appropriate for provider assessment.  Plan:  Awaiting provider recommendations.

## 2024-03-16 ENCOUNTER — Inpatient Hospital Stay: Admission: RE | Admit: 2024-03-16 | Discharge: 2024-03-16 | Attending: Adult Health | Admitting: Adult Health

## 2024-03-16 DIAGNOSIS — N644 Mastodynia: Secondary | ICD-10-CM

## 2024-03-16 DIAGNOSIS — C50412 Malignant neoplasm of upper-outer quadrant of left female breast: Secondary | ICD-10-CM

## 2024-03-27 ENCOUNTER — Other Ambulatory Visit: Payer: Self-pay | Admitting: Family Medicine

## 2024-03-27 DIAGNOSIS — M5416 Radiculopathy, lumbar region: Secondary | ICD-10-CM

## 2024-03-29 NOTE — Telephone Encounter (Signed)
 Last OV 12/30/23 Next OV not scheduled  Last refill 12/02/23 [facility] #90/2

## 2024-03-30 ENCOUNTER — Ambulatory Visit (INDEPENDENT_AMBULATORY_CARE_PROVIDER_SITE_OTHER)

## 2024-03-30 VITALS — Ht 66.0 in | Wt 218.0 lb

## 2024-03-30 DIAGNOSIS — Z0000 Encounter for general adult medical examination without abnormal findings: Secondary | ICD-10-CM

## 2024-03-30 NOTE — Progress Notes (Signed)
 "  Chief Complaint  Patient presents with   Medicare Wellness     Subjective:   Erica [name] is a 48 y.o. female who presents for a Medicare Annual Wellness Visit.  Visit info / Clinical Intake: Persons participating in visit and providing information:: patient Medicare Wellness Visit Mode:: Telephone If telephone:: video declined Since this visit was completed virtually, some vitals may be partially provided or unavailable. Missing vitals are due to the limitations of the virtual format.: Unable to obtain vitals - no equipment If Telephone or Video please confirm:: I connected with patient using audio/video enable telemedicine. I verified patient identity with two identifiers, discussed telehealth limitations, and patient agreed to proceed. Patient Location:: Home Provider Location:: Home Interpreter Needed?: No Pre-visit prep was completed: yes AWV questionnaire completed by patient prior to visit?: yes Living arrangements:: (!) (Patient-Rptd) lives alone Patient's Overall Health Status Rating: (!) (Patient-Rptd) poor Typical amount of pain: (!) (Patient-Rptd) a lot Does pain affect daily life?: (!) (Patient-Rptd) yes  Dietary Habits and Nutritional Risks How many meals a day?: (Patient-Rptd) 2 Eats fruit and vegetables daily?: (Patient-Rptd) yes Most meals are obtained by: (Patient-Rptd) preparing own meals; having others provide food In the last 2 weeks, have you had any of the following?: (!) nausea, vomiting, diarrhea Diabetic:: no  Functional Status Activities of Daily Living (to include ambulation/medication): (!) (Patient-Rptd) Needs Assist Ambulation: Independent with device- listed below Home Assistive Devices/Equipment: Walker (specify Type); Other (Comment) (bipap) Medication Administration: (Patient-Rptd) Dependent Home Management (perform basic housework or laundry): (Patient-Rptd) Needs assistance (comment) Manage your own finances?: (Patient-Rptd) yes Primary  transportation is: (Patient-Rptd) family / friends Concerns about vision?: no *vision screening is required for WTM* Concerns about hearing?: no  Fall Screening Falls in the past year?: (Patient-Rptd) 1 Number of falls in past year: (Patient-Rptd) 1 Was there an injury with Fall?: (Patient-Rptd) 1 Fall Risk Category Calculator: (Patient-Rptd) 3 Patient Fall Risk Level: (Patient-Rptd) High Fall Risk  Fall Risk Patient at Risk for Falls Due to: Impaired balance/gait Fall risk Follow up: Falls evaluation completed; Falls prevention discussed  Home and Transportation Safety: All rugs have non-skid backing?: (Patient-Rptd) N/A, no rugs All stairs or steps have railings?: (Patient-Rptd) yes Grab bars in the bathtub or shower?: (Patient-Rptd) yes Have non-skid surface in bathtub or shower?: (!) (Patient-Rptd) no Good home lighting?: (Patient-Rptd) yes Regular seat belt use?: (Patient-Rptd) yes Hospital stays in the last year:: (Patient-Rptd) no  Cognitive Assessment Difficulty concentrating, remembering, or making decisions? : yes (remembering) Will 6CIT or Mini Cog be Completed: yes What year is it?: 0 points What month is it?: 0 points Give patient an address phrase to remember (5 components): [address], [place], [place] About what time is it?: 0 points Count backwards from 20 to 1: 0 points Say the months of the year in reverse: 0 points Repeat the address phrase from earlier: 2 points 6 CIT Score: 2 points  Advance Directives (For Healthcare) Does Patient Have a Medical Advance Directive?: Yes Type of Advance Directive: Healthcare Power of [place]; Living will Copy of Healthcare Power of Attorney in Chart?: No - copy requested Copy of Living Will in Chart?: No - copy requested Would patient like information on creating a medical advance directive?: No - Patient declined  Reviewed/Updated  Reviewed/Updated: Reviewed All (Medical, Surgical, Family, Medications, Allergies, Care  Teams, Patient Goals)    Allergies (verified) Aspirin , Bactrim [sulfamethoxazole-trimethoprim], Doxycycline , Kiwi extract, Metoprolol , Penicillins, and Strawberry extract   Current Medications (verified) Outpatient Encounter  Medications as of 03/30/2024  Medication Sig   acetaZOLAMIDE  (DIAMOX ) 250 MG tablet Take 4 tablets (1,000 mg total) by mouth 3 (three) times daily.   albuterol  (PROVENTIL ) (2.5 MG/3ML) 0.083% nebulizer solution USE 1 VIAL VIA NEBULIZER EVERY 4 HOURS AND AS NEEDED FOR WHEEZING OR SHORTNESS OF BREATH   albuterol  (VENTOLIN  HFA) 108 (90 Base) MCG/ACT inhaler TAKE 2 PUFFS BY MOUTH EVERY 6 HOURS AS NEEDED FOR WHEEZE OR SHORTNESS OF BREATH   AMBULATORY NON FORMULARY MEDICATION Carpal Tunnel Wrist brace. Rt  Carpal Tunnel Syndrome G56.01  Disp 1   AMBULATORY NON FORMULARY MEDICATION Lace up ankle brace (ASO type)  Ankle sprain S93.491A  Disp 1   amitriptyline  (ELAVIL ) 25 MG tablet Take 5 tablets (125 mg total) by mouth at bedtime for 5 days, THEN 4 tablets (100 mg total) at bedtime for 5 days, THEN 3 tablets (75 mg total) at bedtime for 5 days, THEN 2 tablets (50 mg total) at bedtime for 5 days, THEN 1 tablet (25 mg total) at bedtime for 5 days.   amLODipine  (NORVASC ) 10 MG tablet TAKE 1 TABLET BY MOUTH EVERY DAY   budesonide -formoterol  (SYMBICORT ) 160-4.5 MCG/ACT inhaler INHALE 2 PUFFS INTO LUNGS 2 TIMES DAILY AS DIRECTED   cetirizine  (ZYRTEC  ALLERGY ) 10 MG tablet Take 1 tablet (10 mg total) by mouth daily as needed for allergies.   chlorthalidone  (HYGROTON ) 25 MG tablet TAKE 1 TABLET (25 MG TOTAL) BY MOUTH DAILY.   Cholecalciferol  1.25 MG (50000 UT) capsule Take 1 capsule (50,000 Units total) by mouth once a week.   clindamycin  (CLINDAGEL) 1 % gel APPLY TOPICALLY TWICE A DAY   cloNIDine  (CATAPRES  - DOSED IN MG/24 HR) 0.3 mg/24hr patch PLACE 2 PATCHES ONTO SKIN ONCE WEEKLY   clopidogrel  (PLAVIX ) 75 MG tablet TAKE 1 TABLET BY MOUTH EVERY DAY   clotrimazole  (MYCELEX ) 10 MG  troche TAKE 1 TABLET BY MOUTH 5 TIMES DAILY.   dexlansoprazole  (DEXILANT ) 60 MG capsule Take 1 capsule (60 mg total) by mouth daily.   dextromethorphan-guaiFENesin  (MUCINEX  DM) 30-600 MG 12hr tablet Take 2 tablets by mouth 2 (two) times daily.   diltiazem  (CARDIZEM  CD) 360 MG 24 hr capsule Take 1 capsule (360 mg total) by mouth daily.   doxazosin  (CARDURA ) 8 MG tablet TAKE 2 TABLETS BY MOUTH AT BEDTIME   DULoxetine  (CYMBALTA ) 30 MG capsule TAKE 1 CAPSULE (30 MG TOTAL) BY MOUTH DAILY. WITH 60MG  FOR TOTAL OF 90 MG (Patient taking differently: Take 30 mg by mouth See admin instructions. With 60mg  for total of 90 mg)   DULoxetine  (CYMBALTA ) 60 MG capsule TAKE 1 CAPSULE (60 MG TOTAL) BY MOUTH DAILY. FOR TOTAL OF 90 MG (Patient taking differently: Take 60 mg by mouth See admin instructions. Take with 30 mg for a total of 90 mg in the morning)   ENTRESTO  97-103 MG TAKE 1 TABLET BY MOUTH TWICE A DAY   EPINEPHRINE  0.3 mg/0.3 mL IJ SOAJ injection INJECT 0.3 MLS INTO THE MUSCLE ONCE AS NEEDED FOR ANAPHYLAXIS   Evolocumab  (REPATHA  SURECLICK) 140 MG/ML SOAJ INJECT 140 MG INTO THE SKIN EVERY 14 (FOURTEEN) DAYS.   ezetimibe  (ZETIA ) 10 MG tablet TAKE 1 TABLET BY MOUTH EVERY DAY   famotidine  (PEPCID ) 20 MG tablet Take 1-2 tablets (20-40 mg total) by mouth at bedtime. (Patient taking differently: Take 20 mg by mouth 4 (four) times daily -  before meals and at bedtime.)   gabapentin  (NEURONTIN ) 300 MG capsule TAKE 1 CAPSULE BY MOUTH 3 TIMES DAILY AS NEEDED.  hydrALAZINE  (APRESOLINE ) 100 MG tablet Take 1 tablet (100 mg total) by mouth 3 (three) times daily.   lidocaine -prilocaine  (EMLA ) cream Apply 1 application topically every 28 (twenty-eight) days. Apply 1 application to skin ever 28 days   meloxicam  (MOBIC ) 15 MG tablet Take 1 tablet (15 mg total) by mouth daily.   metoCLOPramide  (REGLAN ) 10 MG tablet TAKE 1 TABLET BY MOUTH 4 TIMES DAILY.   metolazone  (ZAROXOLYN ) 2.5 MG tablet TAKE 1 TABLET BY MOUTH ONE TIME PER  WEEK   minoxidil  (LONITEN ) 2.5 MG tablet TAKE 2 TABLETS (5 MG TOTAL) BY MOUTH 2 (TWO) TIMES DAILY   montelukast  (SINGULAIR ) 10 MG tablet TAKE 1 TABLET BY MOUTH EVERYDAY AT BEDTIME   Nebivolol  HCl 20 MG TABS TAKE 2 TABLETS BY MOUTH EVERY DAY   nitroGLYCERIN  (NITROSTAT ) 0.4 MG SL tablet PLACE 1 TABLET UNDER THE TONGUE EVERY 5 MINUTES AS NEEDED FOR CHEST PAIN.   oxymetazoline  (MUCINEX  SINUS-MAX FULL FORCE) 0.05 % nasal spray Place 1 spray into both nostrils 2 (two) times daily.   Potassium Chloride  ER 20 MEQ TBCR TAKE 2 TABLETS BY MOUTH 2 TIMES DAILY.   primidone  (MYSOLINE ) 50 MG tablet Take 0.5 tablets (25 mg total) by mouth at bedtime.   promethazine  (PHENERGAN ) 25 MG tablet TAKE 1 TABLET (25 MG TOTAL) BY MOUTH 3 (THREE) TIMES DAILY AS NEEDED FOR NAUSEA OR VOMITING.   promethazine -dextromethorphan (PROMETHAZINE -DM) 6.25-15 MG/5ML syrup Take 5 mLs by mouth 4 (four) times daily as needed for cough.   ranolazine  (RANEXA ) 1000 MG SR tablet Take 1 tablet (1,000 mg total) by mouth 2 (two) times daily.   Respiratory Therapy Supplies (FLUTTER) DEVI Use as directed   rosuvastatin  (CRESTOR ) 40 MG tablet TAKE 1 TABLET BY MOUTH EVERY DAY   Spacer/Aero-Holding Chambers (AEROCHAMBER MV) inhaler Use as instructed   spironolactone  (ALDACTONE ) 100 MG tablet TAKE 1 TABLET BY MOUTH EVERY DAY   torsemide  (DEMADEX ) 20 MG tablet TAKE 2 TABLETS BY MOUTH TWICE A DAY   zolpidem  (AMBIEN ) 5 MG tablet Take 1 tablet (5 mg total) by mouth at bedtime as needed for sleep.   Facility-Administered Encounter Medications as of 03/30/2024  Medication   goserelin (ZOLADEX ) injection 3.6 mg    History: Past Medical History:  Diagnosis Date   Allergy     Anxiety    Apnea, sleep 05/04/2014   AHI 31/hr now on CPAP at 12cm H2O   Arthritis    Asthma    Asthma without status asthmaticus 01/19/2016   Axillary hidradenitis suppurativa 08/01/2011   Blood transfusion without reported diagnosis    Breast cancer (HCC) 2010   a.  locally advanced left breast carcinoma diagnosed in 2010 and treated with neoadjuvant chemotherapy with docetaxel and cyclophosphamide as well as paclitaxel. This was followed by radiation therapy which was completed in 2011.   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  -Med calendar 12/07/12 , 04/06/2013 , 04/18/2014,, 08/30/2014 , 11/08/2014  - PFT's 01/12/2013  1.73 (65%)  Ratio 73 and no better p B2, DLCO 86% - trial off    Depression with somatization 06/15/2015   Dyspnea on exertion    a. 01/2013 Lexi MV: EF 54%, no ischemia/infarct;  b. 05/2013 Echo: EF 60-65%, no rwma, Gr 2 DD;  b.    Essential hypertension 10/09/2014   Estimated Creatinine Clearance: 82.4 mL/min (by C-G formula based on SCr of 0.98 mg/dL).   Fatty liver    Gastroparesis    GERD (gastroesophageal reflux disease)    Hyperglycemia 04/13/2011   Hypertension    Hypertensive heart disease 09/26/2015   Hypertriglyceridemia without hypercholesterolemia 06/15/2015   Framingham risk score is 1%    Idiopathic intracranial hypertension 10/21/2014   Impaired glucose tolerance 04/13/2011   Insomnia secondary to anxiety 04/05/2015   Lymphadenitis, chronic    restricted LEFT extremity   Malignant neoplasm of upper-outer quadrant of left breast in female, estrogen receptor negative (HCC) 01/07/2013   Stage III, Invasive Ductal, s/p partial Left Mastectomy/Lumpectomy (baseball sized) with Lymph Node dissection, had Chemo and RadRx      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/2010   OSA (obstructive sleep apnea) 05/04/2014   AHI 31/hr now on CPAP at 12cm H2O   Oxygen deficiency    Uses 02 in CPAP   Persistent proteinuria 03/31/2018   Personal history of chemotherapy 2010   Personal history of radiation therapy 2010   Resistant hypertension 09/17/2011       Sleep apnea    SVT (supraventricular tachycardia) 08/22/2020   Tibialis posterior tendinitis, left 07/15/2018   Injected in March 10, 2019   Ulcer    Upper airway cough syndrome 10/09/2014   Onset around 2012 with assoc pseudowheeze MRI 05/18/15 > Paranasal sinuses are clear - 11/17/2015 rec increase h1 to 2 hs to help with noct cough  - Allergy  profile 11/17/2015 >  Eos 0.1 /  IgE  80 POS RAST  to grass, ragweed, cedar tree  - d/c acei 03/06/2018  - flare end of Jan 2020 in setting of ? Uri/ poorly controlled GERD - flare mid Oct 2020 with uri / assoc secondary gerd   Vitamin D  deficiency disease 03/31/2018   Past Surgical History:  Procedure Laterality Date   BRAIN SURGERY     [place]   BREAST LUMPECTOMY Left 09/2008   CARPAL TUNNEL RELEASE Left 2011   LYMPH NODE DISSECTION Left 2010   breast; 2 wks after breast lumpectomy   RENAL DENERVATION N/A 11/20/2022   Procedure: RENAL DENERVATION;  Surgeon: Darron Deatrice LABOR, MD;  Location: [facility] INVASIVE CV LAB;  Service: Cardiovascular;  Laterality: N/A;   RIGHT/LEFT HEART CATH AND CORONARY ANGIOGRAPHY N/A 05/04/2019   Procedure: RIGHT/LEFT HEART CATH AND CORONARY ANGIOGRAPHY;  Surgeon: Cherrie Toribio SAUNDERS, MD;  Location: [facility] INVASIVE CV LAB;  Service: Cardiovascular;  Laterality: N/A;  Renal injection preformed    RIGHT/LEFT HEART CATH AND CORONARY ANGIOGRAPHY N/A 10/25/2020   Procedure: RIGHT/LEFT HEART CATH AND CORONARY ANGIOGRAPHY;  Surgeon: Anner Alm ORN, MD;  Location: [facility] INVASIVE CV LAB;  Service: Cardiovascular;  Laterality: N/A;   TENDON RELEASE Right 2012   hand   TUBAL LIGATION  05/11/2011   Procedure: POST PARTUM TUBAL  LIGATION;  Surgeon: Lynwood Solomons, MD;  Location: [facility];  Service: Gynecology;  Laterality: Bilateral;  Induced for HTN   Family History  Problem Relation Age of Onset   Other Mother 74       breast calcifications treated with surgery, breast cancer pill, and radiation  Diabetes Mother    Fibroids Sister 61       s/p TAH-BSO   Diabetes Maternal Grandmother    Heart disease Maternal Grandmother    Hypertension Maternal Grandmother    CAD Maternal Grandmother    Breast cancer Other        maternal great aunt (MGM's sister)   Breast cancer Other        paternal great aunt dx middle ages; s/p mastectomy   Cancer Neg Hx    Alcohol abuse Neg Hx    Early death Neg Hx    Hyperlipidemia Neg Hx    Kidney disease Neg Hx    Stroke Neg Hx    Colon cancer Neg Hx    Esophageal cancer Neg Hx    Stomach cancer Neg Hx    Rectal cancer Neg Hx    Colon polyps Neg Hx    Social History   Occupational History    Employer: UNEMPLOYED   Occupation: [employer]  Tobacco Use   Smoking status: Never   Smokeless tobacco: Never  Vaping Use   Vaping status: Never Used  Substance and Sexual Activity   Alcohol use: Yes    Comment: occasionally/socially   Drug use: No   Sexual activity: Yes    Birth control/protection: Surgical, None   Tobacco Counseling Counseling given: Not Answered  SDOH Screenings   Food Insecurity: Patient Declined (03/30/2024)  Housing: Unknown (03/30/2024)  Transportation Needs: Unmet Transportation Needs (03/30/2024)  Utilities: Not At Risk (03/09/2024)  Alcohol Screen: Low Risk (03/09/2024)  Depression (PHQ2-9): High Risk (03/30/2024)  Financial Resource Strain: Medium Risk (03/30/2024)  Physical Activity: Unknown (03/30/2024)  Social Connections: Socially Isolated (03/30/2024)  Stress: Patient Declined (03/30/2024)  Recent Concern: Stress - Stress Concern Present (03/09/2024)  Tobacco Use: Low Risk (03/30/2024)  Health Literacy: Adequate Health Literacy (03/09/2024)    See flowsheets for full screening details  Depression Screen PHQ 2 & 9 Depression Scale- Over the past 2 weeks, how often have you been bothered by any of the following problems? Little interest or pleasure in doing things: 3 Feeling down, depressed, or hopeless (PHQ Adolescent also includes...irritable): 3 PHQ-2 Total Score: 6 Trouble falling or staying asleep, or sleeping too much: 3 (staying asleep) Feeling tired or having little energy: 2 Poor appetite or overeating (PHQ Adolescent also includes...weight loss): 3 (poor appetite) Feeling bad about yourself - or that you are a failure or have let yourself or your family down: 2 Trouble concentrating on things, such as reading the newspaper or watching television (PHQ Adolescent also includes...like school work): 1 Moving or speaking so slowly that other people could have noticed. Or the opposite - being so fidgety or restless that you have been moving around a lot more than usual: 0 Thoughts that you would be better off dead, or of hurting yourself in some way: 0 PHQ-9 Total Score: 17 If you checked off any problems, how difficult have these problems made it for you to do your work, take care of things at home, or get along with other people?: Not difficult at all  Depression Treatment Depression Interventions/Treatment : Medication     Goals Addressed   None          Objective:    Today's Vitals   03/30/24 0853  Weight: 218 lb (98.9 kg)  Height: 5' 6 (1.676 m)   Body mass index is 35.19 kg/m.  Hearing/Vision screen Hearing Screening - Comments:: Denies hearing difficulties   Vision Screening - Comments::  Denies vision issues./not UTD/[place]  Immunizations and Health Maintenance Health Maintenance  Topic Date Due   Hepatitis C Screening  Never done   Hepatitis B Vaccines 19-59 Average Risk (1 of 3 - 19+ 3-dose series) Never done   Zoster Vaccines- Shingrix (1 of 2) Never done   Colonoscopy  Never done    COVID-19 Vaccine (2 - Moderna risk series) 03/29/2021   Mammogram  03/16/2025   Medicare Annual Wellness (AWV)  03/30/2025   Pneumococcal Vaccine: 50+ Years (3 of 3 - PCV20 or PCV21) 12/07/2025   Cervical Cancer Screening (HPV/Pap Cotest)  03/09/2028   DTaP/Tdap/Td (3 - Td or Tdap) 12/07/2031   Influenza Vaccine  Completed   HIV Screening  Completed   HPV VACCINES  Aged Out   Meningococcal B Vaccine  Aged Out        Assessment/Plan:  This is a routine wellness examination for Erica [name].  Patient Care Team: Caudle, Thersia Bitters, FNP as PCP - General (Family Medicine) Raford Riggs, MD as PCP - Cardiology (Cardiology) Elayne Almarie Caldron, PA-C (Neurology) Darlean Ozell NOVAK, MD as Consulting Physician (Pulmonary Disease) Patel, Donika K, DO as Consulting Physician (Neurology) Pyrtle, Gordy HERO, MD as Consulting Physician (Gastroenterology) Crawford, Morna Pickle, NP as Nurse Practitioner (Hematology and Oncology) Ardean No B, RRT (Cardiology)  I have personally reviewed and noted the following in the patients chart:   Medical and social history Use of alcohol, tobacco or illicit drugs  Current medications and supplements including opioid prescriptions. Functional ability and status Nutritional status Physical activity Advanced directives List of other physicians Hospitalizations, surgeries, and ER visits in previous 12 months Vitals Screenings to include cognitive, depression, and falls Referrals and appointments  No orders of the defined types were placed in this encounter.  In addition, I have reviewed and discussed with patient certain preventive protocols, quality metrics, and best practice recommendations. A written personalized care plan for preventive services as well as general preventive health recommendations were provided to patient.   Shana Younge L Kandis Henry, CMA   03/30/2024   Return in 1 year (on 03/30/2025).  After Visit Summary: (MyChart) Due to this being  a telephonic visit, the after visit summary with patients personalized plan was offered to patient via MyChart   Nurse Notes: Patient is due for a Shingrix vaccine and a Hep B vaccine.  She is also due for a Hep C screening and can get that done during her next office visit.  Patient is due for a colonoscopy, which she is aware and stated that she would discuss with Dr. Lovenia.  She had no other concerns to address today and will call the office to schedule her next appointment.   "

## 2024-03-30 NOTE — Patient Instructions (Addendum)
 [name],  Thank you for taking the time for your Medicare Wellness Visit. I appreciate your continued commitment to your health goals. Please review the care plan we discussed, and feel free to reach out if I can assist you further.  Please note that Annual Wellness Visits do not include a physical exam. Some assessments may be limited, especially if the visit was conducted virtually. If needed, we may recommend an in-person follow-up with your provider.  Ongoing Care Seeing your primary care provider every 3 to 6 months helps us  monitor your health and provide consistent, personalized care. Last office visit on 12/17/2023.  You are due for a Shingles vaccine and can get that done at your local pharmacy.  You are also due for a Hep B vaccine and a Hep C screening, these can be done during your next office visit.  Remember to call Dr. Lovenia to get set up for a colonoscopy.  Each day, aim for 6 glasses of water, plenty of protein in your diet and try to get up and walk/ stretch every hour for 5-10 minutes at a time.    Referrals If a referral was made during today's visit and you haven't received any updates within two weeks, please contact the referred provider directly to check on the status.  Recommended Screenings:  Health Maintenance  Topic Date Due   Hepatitis C Screening  Never done   Hepatitis B Vaccine (1 of 3 - 19+ 3-dose series) Never done   Zoster (Shingles) Vaccine (1 of 2) Never done   Colon Cancer Screening  Never done   COVID-19 Vaccine (2 - Moderna risk series) 03/29/2021   Medicare Annual Wellness Visit  03/13/2024   Breast Cancer Screening  03/16/2025   Pneumococcal Vaccine for age over 75 (3 of 3 - PCV20 or PCV21) 12/07/2025   Pap with HPV screening  03/09/2028   DTaP/Tdap/Td vaccine (3 - Td or Tdap) 12/07/2031   Flu Shot  Completed   HIV Screening  Completed   HPV Vaccine  Aged Out   Meningitis B Vaccine  Aged Out       03/30/2024    6:10 AM  Advanced  Directives  Does Patient Have a Medical Advance Directive? Yes  Type of Estate Agent of [place];Living will  Copy of Healthcare Power of Attorney in Chart? No - copy requested    Vision: Annual vision screenings are recommended for early detection of glaucoma, cataracts, and diabetic retinopathy. These exams can also reveal signs of chronic conditions such as diabetes and high blood pressure.  Dental: Annual dental screenings help detect early signs of oral cancer, gum disease, and other conditions linked to overall health, including heart disease and diabetes.  Please see the attached documents for additional preventive care recommendations.

## 2024-04-03 ENCOUNTER — Other Ambulatory Visit: Payer: Self-pay | Admitting: Internal Medicine

## 2024-04-06 ENCOUNTER — Inpatient Hospital Stay: Attending: Adult Health

## 2024-04-06 VITALS — BP 159/72 | HR 70 | Temp 98.4°F | Resp 17

## 2024-04-06 DIAGNOSIS — Z171 Estrogen receptor negative status [ER-]: Secondary | ICD-10-CM | POA: Diagnosis not present

## 2024-04-06 DIAGNOSIS — Z5111 Encounter for antineoplastic chemotherapy: Secondary | ICD-10-CM | POA: Insufficient documentation

## 2024-04-06 DIAGNOSIS — C50412 Malignant neoplasm of upper-outer quadrant of left female breast: Secondary | ICD-10-CM | POA: Diagnosis present

## 2024-04-06 MED ORDER — GOSERELIN ACETATE 3.6 MG ~~LOC~~ IMPL
3.6000 mg | DRUG_IMPLANT | SUBCUTANEOUS | Status: DC
  Administered 2024-04-06: 3.6 mg via SUBCUTANEOUS
  Filled 2024-04-06: qty 3.6

## 2024-04-15 ENCOUNTER — Other Ambulatory Visit: Payer: Self-pay

## 2024-04-15 ENCOUNTER — Ambulatory Visit: Admitting: Family Medicine

## 2024-04-15 VITALS — BP 168/92 | HR 82 | Ht 66.0 in | Wt 222.0 lb

## 2024-04-15 DIAGNOSIS — M65342 Trigger finger, left ring finger: Secondary | ICD-10-CM | POA: Diagnosis not present

## 2024-04-15 DIAGNOSIS — M6530 Trigger finger, unspecified finger: Secondary | ICD-10-CM

## 2024-04-15 NOTE — Progress Notes (Signed)
"       ° °  LILLETTE Ileana Collet, PhD, LAT, ATC acting as a scribe for [name], MD.  Erica [name] is a 49 y.o. female who presents to [facility] at [facility] today for re-occurring trigger finger. Pt was last seen for L 4th trigger finger on 04/15/23 and it was injected.  Today, pt c/o re-occurring 4th trigger finger over the last 2-wks. She states that the finger is more painful and sticking more than prior.   Pertinent review of systems: No fevers or chills  Relevant historical information: Heart failure history of breast cancer   Exam:  BP (!) 168/92   Pulse 82   Ht 5' 6 (1.676 m)   Wt 222 lb (100.7 kg)   LMP  (LMP Unknown)   SpO2 92%   BMI 35.83 kg/m  General: Well Developed, well nourished, and in no acute distress.   MSK: Left hand triggering present at fourth digit.  Tender palpation palmar MCP.    Lab and Radiology Results  Procedure: Real-time Ultrasound Guided Injection of left fourth MCP tendon sheath at A1 pulley.  Trigger finger injection Device: Philips Affiniti 50G/GE Logiq Images permanently stored and available for review in PACS Verbal informed consent obtained.  Discussed risks and benefits of procedure. Warned about infection, bleeding, hyperglycemia damage to structures among others. Patient expresses understanding and agreement Time-out conducted.   Noted no overlying erythema, induration, or other signs of local infection.   Skin prepped in a sterile fashion.   Local anesthesia: Topical Ethyl chloride.   With sterile technique and under real time ultrasound guidance: 40 mg of Kenalog  and 1 mL of lidocaine  injected into A1 pulley tendon sheath. Fluid seen entering the tendon sheath.   Completed without difficulty   Pain immediately resolved suggesting accurate placement of the medication.   Advised to call if fevers/chills, erythema, induration, drainage, or persistent bleeding.   Images permanently stored and available for review in the  ultrasound unit.  Impression: Technically successful ultrasound guided injection.       Assessment and Plan: 49 y.o. female with left hand trigger finger fourth digit.  Plan for trigger finger injection and double Band-Aid splint if not better refer to hand surgery.   PDMP not reviewed this encounter. Orders Placed This Encounter  Procedures   US  LIMITED JOINT SPACE STRUCTURES UP LEFT(NO LINKED CHARGES)    Reason for Exam (SYMPTOM  OR DIAGNOSIS REQUIRED):   left hand pain    Preferred imaging location?:   [facility]   No orders of the defined types were placed in this encounter.    Discussed warning signs or symptoms. Please see discharge instructions. Patient expresses understanding.   The above documentation has been reviewed and is accurate and complete [name], M.D.   "

## 2024-04-15 NOTE — Patient Instructions (Addendum)
 Thank you for coming in today.   You received an injection today. Seek immediate medical attention if the joint becomes red, extremely painful, or is oozing fluid.   Use the double Band-aid splint  Let me know if this doesn't work

## 2024-04-19 ENCOUNTER — Encounter: Payer: Self-pay | Admitting: Family Medicine

## 2024-04-19 ENCOUNTER — Other Ambulatory Visit (HOSPITAL_BASED_OUTPATIENT_CLINIC_OR_DEPARTMENT_OTHER): Payer: Self-pay | Admitting: Family

## 2024-04-19 ENCOUNTER — Other Ambulatory Visit: Payer: Self-pay | Admitting: Internal Medicine

## 2024-04-19 DIAGNOSIS — I1A0 Resistant hypertension: Secondary | ICD-10-CM

## 2024-04-19 DIAGNOSIS — M6530 Trigger finger, unspecified finger: Secondary | ICD-10-CM

## 2024-04-26 ENCOUNTER — Ambulatory Visit: Admitting: Orthopedic Surgery

## 2024-04-27 NOTE — Progress Notes (Unsigned)
 "  Erica [name] - 49 y.o. female MRN [id number]  Date of birth: [birth year]  Office Visit Note: Visit Date: 04/28/2024 PCP: Knute Thersia Bitters, FNP Referred by: Joane Artist RAMAN, MD  Subjective: No chief complaint on file.  HPI: Erica [name] is a pleasant 49 y.o. female who presents today for evaluation of left ring finger trigger digit that is refractory to extensive conservative care.  She has been followed by Dr. Joane and has undergone multiple rounds of cortisone injection to the left ring finger A1 pulley region.  She has had temporary relief of her symptoms after the injections however symptoms have since recurred.  She has had 3 prior injections.  Most recently her injection was performed 1 week prior and has had residual clicking and locking of the digit.  Pertinent ROS were reviewed with the patient and found to be negative unless otherwise specified above in HPI.   Visit Reason: L ring trigger finger Duration of symptoms: 3 weeks Hand dominance: left Occupation: Unemployed Diabetic: No Smoking: No Heart/Lung History: Congestive Heart Failure/Asthma Blood Thinners: Plavix   Prior Testing/EMG:Not sure  Injections (Date): Last week with Dr. Joane- helped some Treatments: Injections x3 Prior Surgery: No    Assessment & Plan: Visit Diagnoses:  1. Trigger finger, left ring finger     Plan: Extensive discussion was had with the patient today regarding her left ring finger trigger digit.  We discussed the etiology and pathophysiology of stenosing tenosynovitis.  We discussed conservative versus surgical treatment modalities.  From a conservative standpoint, we discussed activity modification, splinting, therapy and injections.  From a surgical standpoint, we discussed the possibility for trigger digit release as well as all risk and benefits associated.  Given that patient has trialed conservative treatments such as prior injections with symptoms refractory to conservative  care, patient is indicated for left ring trigger digit release.    Risks and benefits of the procedure were discussed, risks including but not limited to infection, bleeding, scarring, stiffness, nerve injury, tendon injury, vascular injury, recurrence of symptoms and need for subsequent operation.  We also discussed the appropriate postoperative protocol and timeframe for return to activities and function.  Forms of anesthesia were also discussed.  Patient expressed understanding.  Understanding the above, patient would like to proceed with left ring trigger digit release under local anesthesia.  Given that she had a recent injection, I recommended that she wait at least 6 weeks until surgical intervention after injection to limit infection risk.  For the time being, she was placed in a PIP splint.  Will have our surgical scheduling department contact her for an appropriate date and time for her procedure.  Follow-up: No follow-ups on file.   Meds & Orders: No orders of the defined types were placed in this encounter.  No orders of the defined types were placed in this encounter.    Procedures: No procedures performed      Clinical History: No specialty comments available.  She reports that she has never smoked. She has never used smokeless tobacco.  Recent Labs    12/17/23 0938  HGBA1C 5.7  5.7  5.7*    Objective:   Vital Signs: LMP  (LMP Unknown)   Physical Exam  Gen: Well-appearing, in no acute distress; non-toxic CV: Regular Rate. Well-perfused. Warm.  Resp: Breathing unlabored on room air; no wheezing. Psych: Fluid speech in conversation; appropriate affect; normal thought process  Ortho Exam Left hand: - Palpable nodule at the A1 pulley of  the ring finger, associated tenderness - Notable clicking with deep flexion of the ring finger, there is evidence of significant locking with deep flexion requiring manual correction - Sensation intact distally, hand remains warm  well-perfused    Imaging: No results found.  Past Medical/Family/Surgical/Social History: Medications & Allergies reviewed per EMR, new medications updated. Patient Active Problem List   Diagnosis Date Noted   Chronic fatigue 12/17/2023   Prediabetes 06/13/2023   Moderate episode of recurrent major depressive disorder (HCC) 02/04/2023   GAD (generalized anxiety disorder) 02/04/2023   Chronic respiratory failure with hypoxia (HCC) 12/31/2021   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   Coronary artery disease    Chronic stable angina 08/22/2020   SVT (supraventricular tachycardia) 08/22/2020   Palpitations 02/28/2020   Right ankle pain 04/14/2019   OSA (obstructive sleep apnea) 10/01/2018   Tibialis posterior tendinitis, left 07/15/2018   Chronic pain in left foot 06/29/2018   Vitamin D  deficiency disease 03/31/2018   Persistent proteinuria 03/31/2018   Hyperlipidemia LDL goal <100 03/30/2018   Chemotherapy-induced peripheral neuropathy 12/09/2016   Asthma in adult, mild persistent, uncomplicated 01/19/2016   Hypertensive heart disease 09/26/2015   Depression with somatization 06/15/2015   Hypertriglyceridemia without hypercholesterolemia 06/15/2015   Hypokalemia 06/15/2015   Insomnia secondary to anxiety 04/05/2015   Idiopathic intracranial hypertension 10/21/2014   Upper airway cough syndrome 10/09/2014   Malignant hypertension 10/09/2014   Morbid obesity due to excess calories (HCC) 10/09/2014   Apnea, sleep 05/04/2014   Acute on chronic diastolic CHF (congestive heart failure), NYHA class 2 (HCC) 05/03/2014   DOE (dyspnea on exertion) 07/14/2013   Acute on chronic diastolic heart failure (HCC) 06/23/2013   Malignant neoplasm of upper-outer quadrant of left breast in female, estrogen receptor negative (HCC) 01/07/2013   Gastroparesis 01/04/2013   Resistant hypertension 09/17/2011   Axillary hidradenitis suppurativa 08/01/2011   Preventative health care 04/13/2011   Hyperglycemia  04/13/2011   GERD (gastroesophageal reflux disease) 03/18/2011   Cough variant asthma 09/27/2010   Past Medical History:  Diagnosis Date   Allergy     Anxiety    Apnea, sleep 05/04/2014   AHI 31/hr now on CPAP at 12cm H2O   Arthritis    Asthma    Asthma without status asthmaticus 01/19/2016   Axillary hidradenitis suppurativa 08/01/2011   Blood transfusion without reported diagnosis    Breast cancer (HCC) 2010   a. locally advanced left breast carcinoma diagnosed in 2010 and treated with neoadjuvant chemotherapy with docetaxel and cyclophosphamide as well as paclitaxel. This was followed by radiation therapy which was completed in 2011.   Chemotherapy-induced peripheral neuropathy 12/09/2016   CHF (congestive heart failure) (HCC)    Chronic diastolic CHF (congestive heart failure) (HCC) 06/23/2013   Chronic kidney disease    Chronic pain in left foot 06/29/2018   Chronic stable angina 08/22/2020   Cough variant asthma 09/27/2010   Followed in Pulmonary clinic/ [facility]/ [place]  Onset reported in childhood  - PFT's 01/31/2011 FEV1  1.76 (60% ) ratio 68 and no better p B2,  DLCO 70% -med review 09/25/2012 > [place]  drug assistance  paperwork given    - HFA 90% 11/20/2012 .  -Med calendar 12/07/12 , 04/06/2013 , 04/18/2014,, 08/30/2014 , 11/08/2014  - PFT's 01/12/2013  1.73 (65%)  Ratio 73 and no better p B2, DLCO 86% - trial off    Depression with somatization 06/15/2015   Dyspnea on exertion    a. 01/2013 Lexi MV: EF 54%, no ischemia/infarct;  b. 05/2013  Echo: EF 60-65%, no rwma, Gr 2 DD;  b.    Essential hypertension 10/09/2014   Estimated Creatinine Clearance: 82.4 mL/min (by C-G formula based on SCr of 0.98 mg/dL).   Fatty liver    Gastroparesis    GERD (gastroesophageal reflux disease)    Hyperglycemia 04/13/2011   Hypertension    Hypertensive heart disease 09/26/2015   Hypertriglyceridemia without hypercholesterolemia 06/15/2015   Framingham risk score is 1%    Idiopathic  intracranial hypertension 10/21/2014   Impaired glucose tolerance 04/13/2011   Insomnia secondary to anxiety 04/05/2015   Lymphadenitis, chronic    restricted LEFT extremity   Malignant neoplasm of upper-outer quadrant of left breast in female, estrogen receptor negative (HCC) 01/07/2013   Stage III, Invasive Ductal, s/p partial Left Mastectomy/Lumpectomy (baseball sized) with Lymph Node dissection, had Chemo and RadRx     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/2010   OSA (obstructive sleep apnea) 05/04/2014   AHI 31/hr now on CPAP at 12cm H2O   Oxygen deficiency    Uses 02 in CPAP   Persistent proteinuria 03/31/2018   Personal history of chemotherapy 2010   Personal history of radiation therapy 2010   Resistant hypertension 09/17/2011       Sleep apnea    SVT (supraventricular tachycardia) 08/22/2020   Tibialis posterior tendinitis, left 07/15/2018   Injected in March 10, 2019   Ulcer    Upper airway cough syndrome 10/09/2014   Onset around 2012 with assoc pseudowheeze MRI 05/18/15 > Paranasal sinuses are clear - 11/17/2015 rec increase h1 to 2 hs to help with noct cough  - Allergy  profile 11/17/2015 >  Eos 0.1 /  IgE  80 POS RAST  to grass, ragweed, cedar tree  - d/c acei 03/06/2018  - flare end of Jan 2020 in setting of ? Uri/ poorly controlled GERD - flare mid Oct 2020 with uri / assoc secondary gerd   Vitamin D  deficiency disease 03/31/2018   Family History  Problem Relation Age of Onset   Other Mother 19       breast calcifications treated with surgery, breast cancer pill, and radiation   Diabetes Mother    Fibroids Sister 57       s/p TAH-BSO   Diabetes Maternal Grandmother    Heart disease Maternal Grandmother    Hypertension Maternal Grandmother    CAD Maternal Grandmother    Breast cancer Other        maternal great aunt (MGM's sister)   Breast cancer Other        paternal great aunt dx middle ages; s/p mastectomy   Cancer Neg Hx    Alcohol abuse Neg Hx    Early death Neg Hx     Hyperlipidemia Neg Hx    Kidney disease Neg Hx    Stroke Neg Hx    Colon cancer Neg Hx    Esophageal cancer Neg Hx    Stomach cancer Neg Hx    Rectal cancer Neg Hx    Colon polyps Neg Hx    Past Surgical History:  Procedure Laterality Date   BRAIN SURGERY     Shunt   BREAST LUMPECTOMY Left 09/2008   CARPAL TUNNEL RELEASE Left 2011   LYMPH NODE DISSECTION Left 2010   breast; 2 wks after breast lumpectomy   RENAL DENERVATION N/A 11/20/2022   Procedure: RENAL DENERVATION;  Surgeon: Darron Deatrice LABOR, MD;  Location: [facility] INVASIVE CV LAB;  Service: Cardiovascular;  Laterality: N/A;   RIGHT/LEFT HEART CATH AND  CORONARY ANGIOGRAPHY N/A 05/04/2019   Procedure: RIGHT/LEFT HEART CATH AND CORONARY ANGIOGRAPHY;  Surgeon: Cherrie Toribio SAUNDERS, MD;  Location: [facility] CV [facility];  Service: Cardiovascular;  Laterality: N/A;  Renal injection preformed    RIGHT/LEFT HEART CATH AND CORONARY ANGIOGRAPHY N/A 10/25/2020   Procedure: RIGHT/LEFT HEART CATH AND CORONARY ANGIOGRAPHY;  Surgeon: Anner Alm ORN, MD;  Location: [facility] CV LAB;  Service: Cardiovascular;  Laterality: N/A;   TENDON RELEASE Right 2012   hand   TUBAL LIGATION  05/11/2011   Procedure: POST PARTUM TUBAL LIGATION;  Surgeon: Lynwood Solomons, MD;  Location: [facility];  Service: Gynecology;  Laterality: Bilateral;  Induced for HTN   Social History   Occupational History    Employer: UNEMPLOYED   Occupation: Diability  Tobacco Use   Smoking status: Never   Smokeless tobacco: Never  Vaping Use   Vaping status: Never Used  Substance and Sexual Activity   Alcohol use: Yes    Comment: occasionally/socially   Drug use: No   Sexual activity: Yes    Birth control/protection: Surgical, None    Yamina Lenis Estela) Natali Lavallee, M.D.  [facility], [facility]  "

## 2024-04-28 ENCOUNTER — Ambulatory Visit (INDEPENDENT_AMBULATORY_CARE_PROVIDER_SITE_OTHER): Admitting: Orthopedic Surgery

## 2024-04-28 DIAGNOSIS — M65342 Trigger finger, left ring finger: Secondary | ICD-10-CM | POA: Diagnosis not present

## 2024-05-04 ENCOUNTER — Inpatient Hospital Stay: Attending: Adult Health

## 2024-05-04 VITALS — BP 191/93 | HR 82 | Temp 98.6°F | Resp 20

## 2024-05-04 DIAGNOSIS — C50412 Malignant neoplasm of upper-outer quadrant of left female breast: Secondary | ICD-10-CM

## 2024-05-04 MED ORDER — GOSERELIN ACETATE 3.6 MG ~~LOC~~ IMPL
3.6000 mg | DRUG_IMPLANT | SUBCUTANEOUS | Status: DC
  Administered 2024-05-04: 3.6 mg via SUBCUTANEOUS
  Filled 2024-05-04: qty 3.6

## 2024-05-10 ENCOUNTER — Ambulatory Visit: Admitting: Orthopedic Surgery

## 2024-06-01 ENCOUNTER — Inpatient Hospital Stay: Attending: Adult Health

## 2024-06-29 ENCOUNTER — Inpatient Hospital Stay

## 2024-07-27 ENCOUNTER — Inpatient Hospital Stay: Attending: Adult Health

## 2024-08-24 ENCOUNTER — Inpatient Hospital Stay: Attending: Adult Health

## 2024-09-21 ENCOUNTER — Inpatient Hospital Stay: Attending: Adult Health | Admitting: Adult Health

## 2024-09-21 ENCOUNTER — Inpatient Hospital Stay
